# Patient Record
Sex: Female | Born: 1935
Health system: Southern US, Community
[De-identification: ages and names within clinical notes are randomized; demographics above are authoritative.]

## PROBLEM LIST (undated history)

## (undated) DIAGNOSIS — F419 Anxiety disorder, unspecified: Secondary | ICD-10-CM

## (undated) DIAGNOSIS — T4145XA Adverse effect of unspecified anesthetic, initial encounter: Secondary | ICD-10-CM

## (undated) DIAGNOSIS — B029 Zoster without complications: Secondary | ICD-10-CM

## (undated) DIAGNOSIS — K449 Diaphragmatic hernia without obstruction or gangrene: Secondary | ICD-10-CM

## (undated) DIAGNOSIS — R42 Dizziness and giddiness: Secondary | ICD-10-CM

## (undated) DIAGNOSIS — J449 Chronic obstructive pulmonary disease, unspecified: Secondary | ICD-10-CM

## (undated) DIAGNOSIS — G9341 Metabolic encephalopathy: Secondary | ICD-10-CM

## (undated) DIAGNOSIS — I69828 Other speech and language deficits following other cerebrovascular disease: Secondary | ICD-10-CM

## (undated) DIAGNOSIS — R0602 Shortness of breath: Secondary | ICD-10-CM

## (undated) DIAGNOSIS — K7689 Other specified diseases of liver: Secondary | ICD-10-CM

## (undated) DIAGNOSIS — K5790 Diverticulosis of intestine, part unspecified, without perforation or abscess without bleeding: Secondary | ICD-10-CM

## (undated) DIAGNOSIS — Z87440 Personal history of urinary (tract) infections: Secondary | ICD-10-CM

## (undated) DIAGNOSIS — K7581 Nonalcoholic steatohepatitis (NASH): Secondary | ICD-10-CM

## (undated) DIAGNOSIS — I10 Essential (primary) hypertension: Secondary | ICD-10-CM

## (undated) DIAGNOSIS — R0902 Hypoxemia: Secondary | ICD-10-CM

## (undated) DIAGNOSIS — J310 Chronic rhinitis: Secondary | ICD-10-CM

## (undated) DIAGNOSIS — E1142 Type 2 diabetes mellitus with diabetic polyneuropathy: Secondary | ICD-10-CM

## (undated) DIAGNOSIS — J9621 Acute and chronic respiratory failure with hypoxia: Secondary | ICD-10-CM

## (undated) DIAGNOSIS — M419 Scoliosis, unspecified: Secondary | ICD-10-CM

## (undated) DIAGNOSIS — K219 Gastro-esophageal reflux disease without esophagitis: Secondary | ICD-10-CM

## (undated) DIAGNOSIS — D696 Thrombocytopenia, unspecified: Secondary | ICD-10-CM

## (undated) DIAGNOSIS — H8110 Benign paroxysmal vertigo, unspecified ear: Principal | ICD-10-CM

## (undated) DIAGNOSIS — M48061 Spinal stenosis, lumbar region without neurogenic claudication: Secondary | ICD-10-CM

## (undated) DIAGNOSIS — R2689 Other abnormalities of gait and mobility: Secondary | ICD-10-CM

## (undated) DIAGNOSIS — J309 Allergic rhinitis, unspecified: Secondary | ICD-10-CM

## (undated) DIAGNOSIS — M5430 Sciatica, unspecified side: Secondary | ICD-10-CM

## (undated) DIAGNOSIS — R41841 Cognitive communication deficit: Secondary | ICD-10-CM

## (undated) DIAGNOSIS — K222 Esophageal obstruction: Secondary | ICD-10-CM

## (undated) DIAGNOSIS — M4180 Other forms of scoliosis, site unspecified: Secondary | ICD-10-CM

## (undated) DIAGNOSIS — E1149 Type 2 diabetes mellitus with other diabetic neurological complication: Secondary | ICD-10-CM

## (undated) DIAGNOSIS — R778 Other specified abnormalities of plasma proteins: Secondary | ICD-10-CM

## (undated) DIAGNOSIS — K5900 Constipation, unspecified: Secondary | ICD-10-CM

## (undated) DIAGNOSIS — D72829 Elevated white blood cell count, unspecified: Secondary | ICD-10-CM

## (undated) DIAGNOSIS — E669 Obesity, unspecified: Secondary | ICD-10-CM

## (undated) DIAGNOSIS — R2681 Unsteadiness on feet: Secondary | ICD-10-CM

## (undated) DIAGNOSIS — H409 Unspecified glaucoma: Secondary | ICD-10-CM

## (undated) DIAGNOSIS — E1140 Type 2 diabetes mellitus with diabetic neuropathy, unspecified: Secondary | ICD-10-CM

## (undated) DIAGNOSIS — M1990 Unspecified osteoarthritis, unspecified site: Secondary | ICD-10-CM

## (undated) DIAGNOSIS — E785 Hyperlipidemia, unspecified: Secondary | ICD-10-CM

## (undated) DIAGNOSIS — T8859XA Other complications of anesthesia, initial encounter: Secondary | ICD-10-CM

## (undated) DIAGNOSIS — R197 Diarrhea, unspecified: Secondary | ICD-10-CM

## (undated) DIAGNOSIS — I442 Atrioventricular block, complete: Secondary | ICD-10-CM

## (undated) DIAGNOSIS — K589 Irritable bowel syndrome without diarrhea: Secondary | ICD-10-CM

## (undated) DIAGNOSIS — M6281 Muscle weakness (generalized): Secondary | ICD-10-CM

## (undated) HISTORY — DX: Other specified diseases of liver: K76.89

## (undated) HISTORY — PX: TONSILLECTOMY AND ADENOIDECTOMY: SUR1326

## (undated) HISTORY — DX: Diverticulosis of intestine, part unspecified, without perforation or abscess without bleeding: K57.90

## (undated) HISTORY — DX: Dizziness and giddiness: R42

## (undated) HISTORY — DX: Gastro-esophageal reflux disease without esophagitis: K21.9

## (undated) HISTORY — DX: Allergic rhinitis, unspecified: J30.9

## (undated) HISTORY — PX: PARATHYROID EXPLORATION: SHX732

## (undated) HISTORY — DX: Chronic rhinitis: J31.0

## (undated) HISTORY — DX: Type 2 diabetes mellitus with diabetic neuropathy, unspecified: E11.40

## (undated) HISTORY — DX: Other abnormalities of gait and mobility: R26.89

## (undated) HISTORY — DX: Hyperlipidemia, unspecified: E78.5

## (undated) HISTORY — DX: Other specified abnormalities of plasma proteins: R77.8

## (undated) HISTORY — DX: Nonalcoholic steatohepatitis (NASH): K75.81

## (undated) HISTORY — DX: Metabolic encephalopathy: G93.41

## (undated) HISTORY — DX: Cognitive communication deficit: R41.841

## (undated) HISTORY — DX: Acute and chronic respiratory failure with hypoxia: J96.21

## (undated) HISTORY — DX: Esophageal obstruction: K22.2

## (undated) HISTORY — DX: Chronic obstructive pulmonary disease, unspecified: J44.9

## (undated) HISTORY — DX: Atrioventricular block, complete: I44.2

## (undated) HISTORY — DX: Other forms of scoliosis, site unspecified: M41.80

## (undated) HISTORY — DX: Essential (primary) hypertension: I10

## (undated) HISTORY — DX: Diaphragmatic hernia without obstruction or gangrene: K44.9

## (undated) HISTORY — DX: Zoster without complications: B02.9

## (undated) HISTORY — DX: Type 2 diabetes mellitus with diabetic polyneuropathy: E11.42

## (undated) HISTORY — DX: Thrombocytopenia, unspecified: D69.6

## (undated) HISTORY — PX: ESOPHAGOGASTRODUODENOSCOPY: SHX1529

## (undated) HISTORY — DX: Diarrhea, unspecified: R19.7

## (undated) HISTORY — DX: Spinal stenosis, lumbar region without neurogenic claudication: M48.061

## (undated) HISTORY — PX: INTRAOCULAR LENS INSERTION: SHX110

## (undated) HISTORY — DX: Type 2 diabetes mellitus with other diabetic neurological complication: E11.49

## (undated) HISTORY — PX: APPENDECTOMY: SHX54

## (undated) HISTORY — DX: Irritable bowel syndrome, unspecified: K58.9

## (undated) HISTORY — PX: TOTAL ABDOMINAL HYSTERECTOMY: SHX209

## (undated) HISTORY — PX: CARPAL TUNNEL RELEASE: SHX101

## (undated) HISTORY — DX: Muscle weakness (generalized): M62.81

## (undated) HISTORY — DX: Scoliosis, unspecified: M41.9

## (undated) HISTORY — DX: Anxiety disorder, unspecified: F41.9

## (undated) HISTORY — DX: Sciatica, unspecified side: M54.30

## (undated) HISTORY — DX: Obesity, unspecified: E66.9

## (undated) HISTORY — DX: Elevated white blood cell count, unspecified: D72.829

## (undated) HISTORY — DX: Constipation, unspecified: K59.00

## (undated) HISTORY — DX: Unsteadiness on feet: R26.81

## (undated) HISTORY — DX: Benign paroxysmal vertigo, unspecified ear: H81.10

## (undated) HISTORY — DX: Personal history of urinary (tract) infections: Z87.440

## (undated) HISTORY — DX: Other speech and language deficits following other cerebrovascular disease: I69.828

---

## 1976-05-08 HISTORY — PX: CHOLECYSTECTOMY OPEN: SUR202

## 1992-08-06 HISTORY — PX: LIVER BIOPSY: SHX301

## 1995-06-29 ENCOUNTER — Encounter: Payer: Self-pay | Admitting: Internal Medicine

## 1998-03-16 ENCOUNTER — Ambulatory Visit (HOSPITAL_COMMUNITY): Admission: RE | Admit: 1998-03-16 | Discharge: 1998-03-16 | Payer: Self-pay | Admitting: *Deleted

## 1999-03-04 ENCOUNTER — Ambulatory Visit (HOSPITAL_COMMUNITY): Admission: RE | Admit: 1999-03-04 | Discharge: 1999-03-04 | Payer: Self-pay | Admitting: Family Medicine

## 1999-03-04 ENCOUNTER — Encounter: Payer: Self-pay | Admitting: Family Medicine

## 1999-08-23 ENCOUNTER — Encounter (HOSPITAL_COMMUNITY): Admission: RE | Admit: 1999-08-23 | Discharge: 1999-11-21 | Payer: Self-pay | Admitting: Internal Medicine

## 2000-08-20 ENCOUNTER — Ambulatory Visit (HOSPITAL_BASED_OUTPATIENT_CLINIC_OR_DEPARTMENT_OTHER): Admission: RE | Admit: 2000-08-20 | Discharge: 2000-08-20 | Payer: Self-pay | Admitting: General Surgery

## 2000-08-20 ENCOUNTER — Encounter (INDEPENDENT_AMBULATORY_CARE_PROVIDER_SITE_OTHER): Payer: Self-pay | Admitting: Specialist

## 2001-03-21 ENCOUNTER — Encounter: Payer: Self-pay | Admitting: *Deleted

## 2001-03-21 ENCOUNTER — Ambulatory Visit (HOSPITAL_COMMUNITY): Admission: RE | Admit: 2001-03-21 | Discharge: 2001-03-21 | Payer: Self-pay | Admitting: *Deleted

## 2001-07-06 HISTORY — PX: COLONOSCOPY: SHX174

## 2001-08-01 ENCOUNTER — Encounter: Payer: Self-pay | Admitting: Internal Medicine

## 2001-12-25 ENCOUNTER — Encounter: Payer: Self-pay | Admitting: Family Medicine

## 2001-12-25 ENCOUNTER — Encounter: Admission: RE | Admit: 2001-12-25 | Discharge: 2001-12-25 | Payer: Self-pay | Admitting: Family Medicine

## 2002-01-08 ENCOUNTER — Encounter: Admission: RE | Admit: 2002-01-08 | Discharge: 2002-04-08 | Payer: Self-pay | Admitting: Family Medicine

## 2003-02-17 ENCOUNTER — Encounter: Payer: Self-pay | Admitting: Orthopedic Surgery

## 2003-02-17 ENCOUNTER — Encounter: Admission: RE | Admit: 2003-02-17 | Discharge: 2003-02-17 | Payer: Self-pay | Admitting: Orthopedic Surgery

## 2003-02-19 ENCOUNTER — Ambulatory Visit (HOSPITAL_COMMUNITY): Admission: RE | Admit: 2003-02-19 | Discharge: 2003-02-19 | Payer: Self-pay | Admitting: Orthopedic Surgery

## 2003-02-19 ENCOUNTER — Ambulatory Visit (HOSPITAL_BASED_OUTPATIENT_CLINIC_OR_DEPARTMENT_OTHER): Admission: RE | Admit: 2003-02-19 | Discharge: 2003-02-19 | Payer: Self-pay | Admitting: Orthopedic Surgery

## 2003-08-14 ENCOUNTER — Encounter: Admission: RE | Admit: 2003-08-14 | Discharge: 2003-09-22 | Payer: Self-pay | Admitting: Endocrinology

## 2004-05-05 ENCOUNTER — Ambulatory Visit: Payer: Self-pay | Admitting: Internal Medicine

## 2004-08-10 ENCOUNTER — Ambulatory Visit: Payer: Self-pay | Admitting: Internal Medicine

## 2005-01-02 ENCOUNTER — Ambulatory Visit: Payer: Self-pay | Admitting: Internal Medicine

## 2005-01-23 ENCOUNTER — Ambulatory Visit: Payer: Self-pay | Admitting: Internal Medicine

## 2005-03-21 ENCOUNTER — Ambulatory Visit: Payer: Self-pay | Admitting: Internal Medicine

## 2005-07-19 ENCOUNTER — Ambulatory Visit: Payer: Self-pay | Admitting: Internal Medicine

## 2005-07-24 ENCOUNTER — Ambulatory Visit: Payer: Self-pay | Admitting: Internal Medicine

## 2005-09-12 ENCOUNTER — Encounter: Admission: RE | Admit: 2005-09-12 | Discharge: 2005-09-12 | Payer: Self-pay | Admitting: Endocrinology

## 2005-11-22 ENCOUNTER — Ambulatory Visit: Payer: Self-pay | Admitting: Internal Medicine

## 2005-11-29 ENCOUNTER — Encounter: Admission: RE | Admit: 2005-11-29 | Discharge: 2005-12-05 | Payer: Self-pay | Admitting: Endocrinology

## 2006-01-30 ENCOUNTER — Ambulatory Visit: Payer: Self-pay | Admitting: Internal Medicine

## 2006-05-23 ENCOUNTER — Ambulatory Visit: Payer: Self-pay | Admitting: Internal Medicine

## 2006-07-11 ENCOUNTER — Ambulatory Visit (HOSPITAL_COMMUNITY): Admission: RE | Admit: 2006-07-11 | Discharge: 2006-07-11 | Payer: Self-pay | Admitting: Ophthalmology

## 2006-09-19 ENCOUNTER — Ambulatory Visit: Payer: Self-pay | Admitting: Internal Medicine

## 2007-01-17 ENCOUNTER — Ambulatory Visit: Payer: Self-pay | Admitting: Internal Medicine

## 2007-04-08 ENCOUNTER — Ambulatory Visit: Payer: Self-pay | Admitting: Internal Medicine

## 2007-04-15 ENCOUNTER — Ambulatory Visit (HOSPITAL_COMMUNITY): Admission: RE | Admit: 2007-04-15 | Discharge: 2007-04-15 | Payer: Self-pay | Admitting: Internal Medicine

## 2007-04-26 ENCOUNTER — Ambulatory Visit: Payer: Self-pay | Admitting: Internal Medicine

## 2007-05-27 ENCOUNTER — Encounter: Payer: Self-pay | Admitting: Internal Medicine

## 2007-05-29 DIAGNOSIS — K589 Irritable bowel syndrome without diarrhea: Secondary | ICD-10-CM | POA: Insufficient documentation

## 2007-05-29 DIAGNOSIS — K7689 Other specified diseases of liver: Secondary | ICD-10-CM | POA: Insufficient documentation

## 2007-05-29 DIAGNOSIS — K219 Gastro-esophageal reflux disease without esophagitis: Secondary | ICD-10-CM | POA: Insufficient documentation

## 2007-05-30 ENCOUNTER — Telehealth (INDEPENDENT_AMBULATORY_CARE_PROVIDER_SITE_OTHER): Payer: Self-pay | Admitting: *Deleted

## 2007-05-31 ENCOUNTER — Telehealth: Payer: Self-pay | Admitting: Internal Medicine

## 2007-06-29 ENCOUNTER — Encounter: Payer: Self-pay | Admitting: Internal Medicine

## 2007-07-16 ENCOUNTER — Telehealth (INDEPENDENT_AMBULATORY_CARE_PROVIDER_SITE_OTHER): Payer: Self-pay | Admitting: *Deleted

## 2007-07-16 ENCOUNTER — Ambulatory Visit: Payer: Self-pay | Admitting: Internal Medicine

## 2007-07-16 DIAGNOSIS — J310 Chronic rhinitis: Secondary | ICD-10-CM | POA: Insufficient documentation

## 2007-07-19 ENCOUNTER — Telehealth (INDEPENDENT_AMBULATORY_CARE_PROVIDER_SITE_OTHER): Payer: Self-pay | Admitting: *Deleted

## 2007-08-01 ENCOUNTER — Telehealth (INDEPENDENT_AMBULATORY_CARE_PROVIDER_SITE_OTHER): Payer: Self-pay | Admitting: *Deleted

## 2007-08-06 ENCOUNTER — Telehealth (INDEPENDENT_AMBULATORY_CARE_PROVIDER_SITE_OTHER): Payer: Self-pay | Admitting: *Deleted

## 2007-08-29 ENCOUNTER — Encounter: Payer: Self-pay | Admitting: Internal Medicine

## 2007-09-08 ENCOUNTER — Encounter: Payer: Self-pay | Admitting: Internal Medicine

## 2007-09-20 ENCOUNTER — Telehealth (INDEPENDENT_AMBULATORY_CARE_PROVIDER_SITE_OTHER): Payer: Self-pay | Admitting: *Deleted

## 2007-10-18 ENCOUNTER — Telehealth: Payer: Self-pay | Admitting: Internal Medicine

## 2007-10-29 ENCOUNTER — Telehealth (INDEPENDENT_AMBULATORY_CARE_PROVIDER_SITE_OTHER): Payer: Self-pay | Admitting: *Deleted

## 2007-11-01 ENCOUNTER — Telehealth (INDEPENDENT_AMBULATORY_CARE_PROVIDER_SITE_OTHER): Payer: Self-pay | Admitting: *Deleted

## 2007-11-04 ENCOUNTER — Telehealth (INDEPENDENT_AMBULATORY_CARE_PROVIDER_SITE_OTHER): Payer: Self-pay | Admitting: *Deleted

## 2007-11-06 ENCOUNTER — Telehealth (INDEPENDENT_AMBULATORY_CARE_PROVIDER_SITE_OTHER): Payer: Self-pay | Admitting: *Deleted

## 2007-11-15 ENCOUNTER — Ambulatory Visit: Payer: Self-pay | Admitting: Internal Medicine

## 2007-11-23 DIAGNOSIS — J302 Other seasonal allergic rhinitis: Secondary | ICD-10-CM | POA: Insufficient documentation

## 2007-11-23 DIAGNOSIS — J3089 Other allergic rhinitis: Secondary | ICD-10-CM

## 2007-11-23 DIAGNOSIS — J449 Chronic obstructive pulmonary disease, unspecified: Secondary | ICD-10-CM | POA: Insufficient documentation

## 2008-01-06 ENCOUNTER — Telehealth (INDEPENDENT_AMBULATORY_CARE_PROVIDER_SITE_OTHER): Payer: Self-pay | Admitting: *Deleted

## 2008-05-08 DIAGNOSIS — B029 Zoster without complications: Secondary | ICD-10-CM

## 2008-05-08 HISTORY — DX: Zoster without complications: B02.9

## 2008-06-16 ENCOUNTER — Ambulatory Visit: Payer: Self-pay | Admitting: Internal Medicine

## 2008-08-10 ENCOUNTER — Encounter: Payer: Self-pay | Admitting: Internal Medicine

## 2008-08-14 ENCOUNTER — Encounter: Payer: Self-pay | Admitting: Internal Medicine

## 2008-08-27 ENCOUNTER — Encounter: Payer: Self-pay | Admitting: Internal Medicine

## 2008-11-27 ENCOUNTER — Telehealth (INDEPENDENT_AMBULATORY_CARE_PROVIDER_SITE_OTHER): Payer: Self-pay | Admitting: *Deleted

## 2008-12-14 ENCOUNTER — Ambulatory Visit: Payer: Self-pay | Admitting: Internal Medicine

## 2009-01-15 ENCOUNTER — Ambulatory Visit: Payer: Self-pay | Admitting: Internal Medicine

## 2009-01-15 DIAGNOSIS — J449 Chronic obstructive pulmonary disease, unspecified: Secondary | ICD-10-CM | POA: Insufficient documentation

## 2009-01-19 ENCOUNTER — Telehealth: Payer: Self-pay | Admitting: Internal Medicine

## 2009-01-25 ENCOUNTER — Telehealth (INDEPENDENT_AMBULATORY_CARE_PROVIDER_SITE_OTHER): Payer: Self-pay | Admitting: *Deleted

## 2009-01-25 ENCOUNTER — Telehealth: Payer: Self-pay | Admitting: Internal Medicine

## 2009-01-28 ENCOUNTER — Ambulatory Visit: Payer: Self-pay | Admitting: Internal Medicine

## 2009-01-28 DIAGNOSIS — R29818 Other symptoms and signs involving the nervous system: Secondary | ICD-10-CM | POA: Insufficient documentation

## 2009-02-23 ENCOUNTER — Telehealth (INDEPENDENT_AMBULATORY_CARE_PROVIDER_SITE_OTHER): Payer: Self-pay | Admitting: *Deleted

## 2009-04-27 ENCOUNTER — Telehealth (INDEPENDENT_AMBULATORY_CARE_PROVIDER_SITE_OTHER): Payer: Self-pay | Admitting: *Deleted

## 2009-05-21 ENCOUNTER — Ambulatory Visit: Payer: Self-pay | Admitting: Internal Medicine

## 2009-05-25 ENCOUNTER — Telehealth: Payer: Self-pay | Admitting: Internal Medicine

## 2009-06-14 ENCOUNTER — Telehealth (INDEPENDENT_AMBULATORY_CARE_PROVIDER_SITE_OTHER): Payer: Self-pay | Admitting: *Deleted

## 2009-08-20 ENCOUNTER — Ambulatory Visit: Payer: Self-pay | Admitting: Internal Medicine

## 2009-10-11 ENCOUNTER — Encounter: Payer: Self-pay | Admitting: Internal Medicine

## 2009-11-04 ENCOUNTER — Encounter: Admission: RE | Admit: 2009-11-04 | Discharge: 2009-11-04 | Payer: Self-pay | Admitting: Endocrinology

## 2009-12-09 ENCOUNTER — Telehealth (INDEPENDENT_AMBULATORY_CARE_PROVIDER_SITE_OTHER): Payer: Self-pay | Admitting: *Deleted

## 2009-12-10 ENCOUNTER — Ambulatory Visit: Payer: Self-pay | Admitting: Internal Medicine

## 2009-12-15 ENCOUNTER — Telehealth (INDEPENDENT_AMBULATORY_CARE_PROVIDER_SITE_OTHER): Payer: Self-pay | Admitting: *Deleted

## 2009-12-22 ENCOUNTER — Telehealth (INDEPENDENT_AMBULATORY_CARE_PROVIDER_SITE_OTHER): Payer: Self-pay | Admitting: *Deleted

## 2009-12-23 ENCOUNTER — Ambulatory Visit: Payer: Self-pay | Admitting: Internal Medicine

## 2009-12-23 DIAGNOSIS — R059 Cough, unspecified: Secondary | ICD-10-CM | POA: Insufficient documentation

## 2009-12-23 DIAGNOSIS — R0789 Other chest pain: Secondary | ICD-10-CM | POA: Insufficient documentation

## 2009-12-23 DIAGNOSIS — R05 Cough: Secondary | ICD-10-CM

## 2009-12-23 DIAGNOSIS — R079 Chest pain, unspecified: Secondary | ICD-10-CM | POA: Insufficient documentation

## 2009-12-24 ENCOUNTER — Telehealth (INDEPENDENT_AMBULATORY_CARE_PROVIDER_SITE_OTHER): Payer: Self-pay | Admitting: *Deleted

## 2009-12-27 ENCOUNTER — Ambulatory Visit: Payer: Self-pay | Admitting: Internal Medicine

## 2009-12-30 ENCOUNTER — Ambulatory Visit: Payer: Self-pay | Admitting: Internal Medicine

## 2010-01-05 ENCOUNTER — Telehealth (INDEPENDENT_AMBULATORY_CARE_PROVIDER_SITE_OTHER): Payer: Self-pay | Admitting: *Deleted

## 2010-01-19 ENCOUNTER — Telehealth (INDEPENDENT_AMBULATORY_CARE_PROVIDER_SITE_OTHER): Payer: Self-pay | Admitting: *Deleted

## 2010-02-28 ENCOUNTER — Telehealth (INDEPENDENT_AMBULATORY_CARE_PROVIDER_SITE_OTHER): Payer: Self-pay | Admitting: *Deleted

## 2010-03-09 ENCOUNTER — Encounter: Payer: Self-pay | Admitting: Nurse Practitioner

## 2010-03-11 ENCOUNTER — Telehealth: Payer: Self-pay | Admitting: Internal Medicine

## 2010-03-16 ENCOUNTER — Telehealth (INDEPENDENT_AMBULATORY_CARE_PROVIDER_SITE_OTHER): Payer: Self-pay | Admitting: *Deleted

## 2010-03-16 ENCOUNTER — Ambulatory Visit: Payer: Self-pay | Admitting: Gastroenterology

## 2010-03-16 DIAGNOSIS — H409 Unspecified glaucoma: Secondary | ICD-10-CM | POA: Insufficient documentation

## 2010-03-16 DIAGNOSIS — R490 Dysphonia: Secondary | ICD-10-CM | POA: Insufficient documentation

## 2010-04-01 ENCOUNTER — Telehealth (INDEPENDENT_AMBULATORY_CARE_PROVIDER_SITE_OTHER): Payer: Self-pay | Admitting: *Deleted

## 2010-04-05 ENCOUNTER — Telehealth: Payer: Self-pay | Admitting: Nurse Practitioner

## 2010-04-29 ENCOUNTER — Ambulatory Visit: Payer: Self-pay | Admitting: Internal Medicine

## 2010-06-09 NOTE — Progress Notes (Signed)
Summary: ? re;meds & test  Phone Note Call from Patient Call back at Home Phone [phone]   Caller: Patient Call For: wert Reason for Call: Talk to Nurse Summary of Call: pt has question about prednisone prescribed on 12/23/2009 and no one called her re: her sinus CT. Initial call taken by: Zigmund Gottron,  December 24, 2009 8:08 AM  Follow-up for Phone Call        Pt should let her DM dr know she is on prednisone and they can adjust meds and monitor Blood sugars accordingly.Clayborne Dana CMA  December 24, 2009 9:03 AM   Additional Follow-up for Phone Call Additional follow up Details #1::        Per Dr. Melvyn Novas pt can spread the prednisone out during the day and don't have to take all at one time. Pt is aware that [facility] will be contacting her today about her sinus CT [facility]  December 24, 2009 9:12 AM

## 2010-06-09 NOTE — Progress Notes (Signed)
Summary: GI issues with nurse   cPhone Note Call from Patient Call back at Home Phone [phone]   Call For: Dr [name] Reason for Call: Talk to Nurse Summary of Call: Is having some issues she would like to discuss with nurse. Initial call taken by: Irwin Brakeman [facility],  March 11, 2010 10:06 AM  Follow-up for Phone Call        Patient called to report she has had a cough since August. She went to see ENT and he told her "my voice box  is eroded from acid reflux." ENT suggested she call and see if she should change her reflux medication. She is currently taking Prilosec two times a day per Dr. Melvyn Novas without relief. Also states she hurts in the center of her chest and has pressure like she has had in the past with reflux and her stomach feels bloated. States she has had this pain for a long time not new. Last EGD 12/8- hiatal hernia and esophagel stricture. Patient has a hx of COPD, diabetes and chronic diarrhea.  Patient scheduled for appointment with Tye Savoy, RNP on 03/16/10 at 9:30. Patient will go to the ER if her chest pain gets worse, changes  or radiates down her arm. Any other suggestions?  Follow-up by: Leone Payor RN,  March 11, 2010 12:21 PM  Additional Follow-up for Phone Call Additional follow up Details #1::        PPIs are best taken 20 -30 min before meals (BF and dinner), would make she is on that schedule. Can also add a bedtime H2 blocker (such as zantac/pepic OTC). two times a day PPI and at bedtime H2 blocker really is maximum medical therapy. Additional Follow-up by: Milus Banister MD,  March 11, 2010 12:40 PM    Additional Follow-up for Phone Call Additional follow up Details #2::    Patient verified that she does take PPI 30 minutes prior to breakfast and supper. Patient given recommendations per Dr. Ardis Hughs. She will add Zantac or Pepcid OTC  q hs. Patient will keep appointment. Follow-up by: Leone Payor RN,  March 11, 2010 1:37 PM

## 2010-06-09 NOTE — Progress Notes (Signed)
Summary: pt condition  Phone Note Call from Patient Call back at Home Phone [phone]   Caller: Patient Call For: young Reason for Call: Talk to Nurse Summary of Call: calling in to speak to Katie re: how pt is doing.  She  is alright, but not well.  Would like to discuss with Katie. Initial call taken by: Zigmund Gottron,  December 15, 2009 10:44 AM  Follow-up for Phone Call        Pt c/o SOB, weakness, productive cough with light green mucus, night sweats. Pt denies sore throat. Pt states will finish all medications today and only feels a little better. Pt requesting more medication, stating until her mucus is clear she will not be well. Please advise. Thanks. Iran Planas CMA  December 15, 2009 10:54 AM   Allergies (verified):  1)  ! Codeine 2)  ! * Ultram/ Tramadol  Additional Follow-up for Phone Call Additional follow up Details #1::        Offer augmentin 500 mg, # 14, 1 bid Additional Follow-up by: Deneise Lever MD,  December 15, 2009 12:33 PM    Additional Follow-up for Phone Call Additional follow up Details #2::    Pt aware and knows RX sent.Clayborne Dana CMA  December 15, 2009 2:31 PM   New/Updated Medications: AUGMENTIN 500-125 MG TABS (AMOXICILLIN-POT CLAVULANATE) take 1 by mouth two times a day Prescriptions: AUGMENTIN 500-125 MG TABS (AMOXICILLIN-POT CLAVULANATE) take 1 by mouth two times a day  #14 x 0   Entered by:   Clayborne Dana CMA   Authorized by:   Deneise Lever MD   Signed by:   Clayborne Dana CMA on 12/15/2009   Method used:   Electronically to        [facility]* (retail)       [address] [place], [place]  [address]       Ph: [phone]       Fax: [phone]   RxID:   [id number]

## 2010-06-09 NOTE — Progress Notes (Signed)
Summary: bronchitis  Phone Note Call from Patient   Caller: Patient Call For: young Summary of Call: bronchitis  [employer] Initial call taken by: Gustavus Bryant,  June 14, 2009 10:29 AM  Follow-up for Phone Call        Pt c/o productive cough with clear mucus, head and chest congestion, nasal drainage, x 1 week and productive cough with green mucus starting this AM. Pt request CY to send in zpak and prednisone 33m tabs to pharmacy. Please advise. JIran PlanasCMA  June 14, 2009 12:02 PM   Medication allergies: 1)  ! Codeine 2)  ! * Ultram/ Tramadol   Additional Follow-up for Phone Call Additional follow up Details #1::        Per CDY- give Zpak #1 take as directed no refills and Prednsione 580m#40 2 qid x 2 days, 2 three times a day x 2 days, 2 two times a day x 2 days, 2 once daily x 2 days; no refills.KaClayborne DanaMA  June 14, 2009 1:51 PM   pt aware meds at the pharmacy Additional Follow-up by: TaMaryann ConnersMA,  June 14, 2009 2:26 PM    New/Updated Medications: ZITHROMAX Z-PAK 250 MG TABS (AZITHROMYCIN) take as directed PREDNISONE 5 MG TABS (PREDNISONE) 2 tabs four times a day x 2 days, 2 tabs three times a day x 2 days,2 tabs twice a day x 2 days and 2 tabs once daily x 2 days Prescriptions: PREDNISONE 5 MG TABS (PREDNISONE) 2 tabs four times a day x 2 days, 2 tabs three times a day x 2 days,2 tabs twice a day x 2 days and 2 tabs once daily x 2 days  #40 x 0   Entered by:   TaMaryann ConnersMA   Authorized by:   ClDeneise LeverD   Signed by:   TaMaryann ConnersMA on 06/14/2009   Method used:   [facility]ically to        [facility]       [address]  [address]     Ph: [phone]     Fax: [phone] RxID:   [id number] Z-PAK 250 MG TABS (AZITHROMYCIN) take as directed  #1 x 0   Entered by:   TaMaryann ConnersMA   Authorized by:    ClDeneise LeverD   Signed by:   TaMaryann ConnersMA on 06/14/2009   Method used:   [facility]        [facility]       [address]  [address]     Ph: [phone]     Fax: [phone] RxID:   [id number]

## 2010-06-09 NOTE — Progress Notes (Signed)
Summary: ? brochitis  Phone Note Call from Patient Call back at Home Phone [phone]   Caller: Patient Call For: young Reason for Call: Talk to Nurse Summary of Call: Pt states she's has bronchitis symptoms, coughing and can not stop.  Patient asking for a zpack.  [employer] Initial call taken by: Mateo Flow,  February 28, 2010 10:55 AM  Follow-up for Phone Call        called spoke with prod cough with green mucus, increased SOB, wheezing x4-5days.  requests zpak.  please advise, thanks!  ALLERGIES: codeine, tramadol.     Follow-up by: Parke Poisson CNA/MA,  February 28, 2010 12:23 PM  Additional Follow-up for Phone Call Additional follow up Details #1::        per cdy zpak #1 take as directed 0 refills [facility]  February 28, 2010 1:54 PM     Additional Follow-up for Phone Call Additional follow up Details #2::    Rx for zpack was sent to pharm.  Spoke with pt and notified this was done. Follow-up by: Tilden Dome,  February 28, 2010 2:02 PM  New/Updated Medications: ZITHROMAX Z-PAK 250 MG TABS (AZITHROMYCIN) take as directed Prescriptions: ZITHROMAX Z-PAK 250 MG TABS (AZITHROMYCIN) take as directed  #1 x 0   Entered by:   Tilden Dome   Authorized by:   Deneise Lever MD   Signed by:   Tilden Dome on 02/28/2010   Method used:   Electronically to        [facility]* (retail)       [address] [place], [place]  [address]       Ph: [phone]       Fax: [phone]   RxID:   [id number]

## 2010-06-09 NOTE — Assessment & Plan Note (Signed)
Summary: cough/rhinitis//lmr   Primary Provider/Referring Provider:  [name]  CC:  Accute visit-cough; sinus trouble-runny nose+green; "sick x 1 month".  History of Present Illness: December 10, 2009- Bronchitis, allergic rhinitis Hasn't felt well for past week. Starting 2 weeks ago she began having pain in upper back. [name] did CXR -negatve .He gave prednisone. She hadn't been coughing. Tired, sluggish, no fever. 6 days ago after working in home to pack for a move, she began coughing. Took cough syrup, then a zpak from primary office. Cough has become productive green. For 2 days wearing O2 continuously. rx doxy  December 23, 2009 Dr. Annamaria Boots pt. Pt c/o increased productive  cough with dark green to black mucus, nasal drainage during the day and dries out at night, lethargic, nausea, upper back pain between shoulders "raw" throat x 2 weeks ago.Pt states now has "dry hacky" cough. Pt has been treated with zpak, prednisone, Augmentin, breathing treatment > mucus white now, cough no better, still nasal congestion  and cp L shoulder positional not pleuritic.  Pt denies any significant sore throat, dysphagia, itching, sneezing,  fever, chills, sweats  December 30, 2009- Bronchitis, allergic rhinitis Saw Dr Melvyn Novas in my absence.  EKG was ok. He went over GERD management and she is now taking prilosec two times a day. CXR showed mild COPD. She reports she partly improved green sputum with Zpak from [name] now resolved. CT sinuses- Clear/ normal;. She talks about watery rhinorhea especially after waking in the AM and after supper. Has glaucoma in left eye.   Preventive Screening-Counseling & Management  Alcohol-Tobacco     Smoking Status: quit     Year Started: 1959     Year Quit: 1990     Pack years: 2- 3 ppd x 45 years     Passive Smoke Exposure: yes  Current Medications (verified): 1)  Atrovent Hfa 17 Mcg/act  Aers (Ipratropium Bromide Hfa) .... Inhale 2 Puffs Four Times A Day As Needed 2)   Oxygen 3-3.5l 3)  Fluticasone Propionate 50 Mcg/act  Susp (Fluticasone Propionate) .Marland Kitchen.. 1-2 Puffs Each Nostril Daily 4)  Toprol Xl 25 Mg  Tb24 (Metoprolol Succinate) .... Take 1 Tablet By Mouth Once A Day 5)  Atacand 16 Mg Tabs (Candesartan Cilexetil) .... Take 1 Tablet By Mouth Once A Day 6)  Prilosec 20 Mg  Cpdr (Omeprazole) .... Take One 30-60 Min Before First and Last Meals of The Day 7)  Aspirin 325 Mg Tabs (Aspirin) .... Take 1 Tablet By Mouth Once A Day 8)  Lantus 100 Unit/ml  Soln (Insulin Glargine) .... Use As Directed. 9)  Humalog Kwikpen 100 Unit/ml  Soln (Insulin Lispro (Human)) .... Use As Directed 10)  Alprazolam 0.5 Mg Tabs (Alprazolam) .... Take 1 By Mouth Three Times A Day As Needed 11)  Actos 30 Mg Tabs (Pioglitazone Hcl) .... Take 1 By Mouth Once Daily 12)  Tylenol Extra Strength 500 Mg Tabs (Acetaminophen) .... Take As Needed Headache. 13)  Promethazine-Codeine 6.25-10 Mg/55m Syrp (Promethazine-Codeine) .... Take 1 Teaspoonful By Mouth Every 6 Hours As Needed Cough 14)  Prednisone 10 Mg  Tabs (Prednisone) .... 4 Each Am X 2days, 2x2days, 1x2days and Stop  Allergies (verified): 1)  ! Codeine 2)  ! * Ultram/ Tramadol  Past History:  Past Medical History: Last updated: 12/23/2009 I B S-DIARRHEAL PREDOMINATE (ICD-564.1) FATTY LIVER DISEASE (ICD-571.8) G E R D (ICD-530.81) Allergic Rhinitis     - Sinus CT Rec December 23, 2009  COPD     -  HFA 75-90% p coaching December 23, 2009  Sciatica Shingles- 2010  Past Surgical History: Last updated: 05/29/2007 open cholecystectomy 1978 liver biopsy 4/94 parathyroid surgery T&A TAH EGD 7654,65/03, H Hernia, es.stricture s/p dil 73F colonoscopy 07/2001, mild diverticulosis  Family History: Last updated: 12/30/09 Mother -died smallcell  lung cancer, had Byssinosis Father- died heart disease, age 50 Sister- had lung cancer- died  Social History: Last updated: 12/23/2009 Patient states former smoker. in  1990s Divorced  Risk Factors: Smoking Status: quit (12/30/2009) Passive Smoke Exposure: yes (12/30/2009)  Review of Systems      See HPI       The patient complains of shortness of breath with activity, productive cough, non-productive cough, and sneezing.  The patient denies coughing up blood, chest pain, irregular heartbeats, acid heartburn, indigestion, loss of appetite, weight change, abdominal pain, difficulty swallowing, sore throat, tooth/dental problems, headaches, and nasal congestion/difficulty breathing through nose.    Vital Signs:  Patient profile:   75 year old female Height:      63 inches Weight:      202 pounds BMI:     35.91 O2 Sat:      90 % on Room air Pulse rate:   82 / minute BP sitting:   102 / 58  (left arm) Cuff size:   regular  Vitals Entered By: Clayborne Dana CMA (December 30, 2009 11:28 AM)  O2 Flow:  Room air CC: Accute visit-cough; sinus trouble-runny nose+green; "sick x 1 month"   Physical Exam  Additional Exam:  General: A/Ox3; pleasant and cooperative, NAD, SKIN: no rash, lesions NODES: no lymphadenopathy HEENT: [place]/AT, EOM- WNL, Conjuctivae- clear, PERRLA, TM-WNL, Nose- clear, Throat- clear and wnl, Mallampati  III NECK: Supple w/ fair ROM, JVD- none, normal carotid impulses w/o bruits Thyroid- normal to palpation CHEST: Clear to P&A, not labored and no cough or wheeze HEART: RRR, no m/g/r heard ABDOMEN: Soft and nl; nml bowel sounds; no organomegaly or masses noted TWS:FKCL, nl pulses, no edema  NEURO: Grossly intact to observation, very talkative      CXR  Procedure date:  12/23/2009  Findings:      DG CHEST 2 VIEW - [id number]   Clinical Data: Shortness of breath and chest pain.  Cough.  History of COPD.   CHEST - 2 VIEW   Comparison: 11/04/2009 and 05/21/2009.   Findings: The heart size and mediastinal contours are stable. There is mild aortic atherosclerosis.  The lungs remain mildly hyperinflated but clear.  There is no  pleural effusion or pneumothorax.  Mild thickening of the minor fissure is noted.   IMPRESSION: Stable examination with evidence of mild chronic obstructive pulmonary disease.  No acute findings identified.   Read By:  Vivia Ewing,  M.D.     Released By:  Vivia Ewing,  M.D.   CT of Sinus  Procedure date:  12/27/2009  Findings:      CT SINUS LTD W/O CM - [id number]   Clinical Data: Sinus drainage.  Cough.   CT PARANASAL SINUS LIMITED WITHOUT CONTRAST   Technique:  Multidetector CT images of the paranasal sinuses were obtained in a single plane without contrast.   Comparison: None   Findings: The paranasal sinuses and mastoid air cells are clear. There is slight deviation of the bony nasal septum rightward and mild spurring change.  The ostiomeatal complexes are grossly patent.  No significant thickening of the turbinates.   IMPRESSION: Clear paranasal sinuses.   Read By:  Marijo Sanes.  Elta Guadeloupe,  M.D.     Released By:  Liliana Cline,  M.D.   Impression & Recommendations:  Problem # 1:  CHRONIC RHINITIS (ICD-472.0)  Rhintis with bothersome rhinorhea. Both allergic and vasomotor comnponents. I will give her sample Astepro antihistamine spray to try   Problem # 2:  COPD (ICD-496) She finshed prednisone taper. I don't see an indication for another antibiotic now. She never feels quite clear in her chest, but I explained again her COPD status. Note that glaucoma may tolerate atrovent as a rescue med for occasional use, but not sustained therapy with spiriva. She doesnt know what kind of glaucoma she has. She tolerates stimulant beta adrenergics poorly.  Problem # 3:  G E R D (ICD-530.81) GERD control was reinforced as a potential aggravating factor for airway irritation. Her updated medication list for this problem includes:    Prilosec 20 Mg Cpdr (Omeprazole) .Marland Kitchen... Take one 30-60 min before first and last meals of the day  Medications Added to Medication  List This Visit: 1)  Actos 30 Mg Tabs (Pioglitazone hcl) .... Take 1 by mouth once daily  Other Orders: Est. Patient Level III (18841)  Patient Instructions: 1)  Please schedule a follow-up appointment in 4 months. 2)  Sample Astepro antihistamine nasal spray. 1-2 puffs each nostril up to twice daily when needed for watery nose.     CXR  Procedure date:  12/23/2009  Findings:      DG CHEST 2 VIEW - [id number]   Clinical Data: Shortness of breath and chest pain.  Cough.  History of COPD.   CHEST - 2 VIEW   Comparison: 11/04/2009 and 05/21/2009.   Findings: The heart size and mediastinal contours are stable. There is mild aortic atherosclerosis.  The lungs remain mildly hyperinflated but clear.  There is no pleural effusion or pneumothorax.  Mild thickening of the minor fissure is noted.   IMPRESSION: Stable examination with evidence of mild chronic obstructive pulmonary disease.  No acute findings identified.   Read By:  Vivia Ewing,  M.D.     Released By:  Vivia Ewing,  M.D.   CT of Sinus  Procedure date:  12/27/2009  Findings:      CT SINUS LTD W/O CM - [id number]   Clinical Data: Sinus drainage.  Cough.   CT PARANASAL SINUS LIMITED WITHOUT CONTRAST   Technique:  Multidetector CT images of the paranasal sinuses were obtained in a single plane without contrast.   Comparison: None   Findings: The paranasal sinuses and mastoid air cells are clear. There is slight deviation of the bony nasal septum rightward and mild spurring change.  The ostiomeatal complexes are grossly patent.  No significant thickening of the turbinates.   IMPRESSION: Clear paranasal sinuses.   Read By:  Liliana Cline,  M.D.     Released By:  Liliana Cline,  M.D.

## 2010-06-09 NOTE — Progress Notes (Signed)
Summary: update   Phone Note Call from Patient Call back at Home Phone [phone]   Caller: Patient Call For: Tye Savoy, NP Reason for Call: Talk to Nurse Summary of Call: 3 wk update: feeling better but out of Nexium... would like a rx for Nexium as she thinks this is better than Prilosec... still coughing  Initial call taken by: Lucien Mons,  April 05, 2010 10:53 AM  Follow-up for Phone Call        She came here for cough and hoarse voice so if still coughing, not sure how she is better but at any rate she can have the Nexium. She was previously on omeprazole twice daily  Follow-up by: Tye Savoy NP,  April 06, 2010 10:18 AM  Additional Follow-up for Phone Call Additional follow up Details #1::        [name] says the Nexium helps her refulx but it is giving her a headache and diarrhea.  She rarely gets headaches and was doing good with the diarrhea .  Now the diarrhea has started since she has taken the Nexium. Additional Follow-up by: Sharol Roussel,  April 08, 2010 2:09 PM    Additional Follow-up for Phone Call Additional follow up Details #2::    if she is having terrible diarrhea then should be seen.  Otherwise, don't understand why Nexium causing diarrhea if Prilosec didn't. She can go back on Prilosec for few days then rechallenge the Nexium to see if she gets recurrent diarrhea.  Follow-up by: Tye Savoy NP,  April 11, 2010 3:56 PM  Additional Follow-up for Phone Call Additional follow up Details #3:: Details for Additional Follow-up Action Taken: The pt said she had a closer to normal Bm this Am.  She is taking the Prilosec now and will let me know if the diarrhea reoccurs.  She is still hoarse and she said her ENT MD says it is reflux.  She doesn't like him.  I told her to call us next week with an update.  I offered to make an appt to see Dr. [name] but she declined.

## 2010-06-09 NOTE — Assessment & Plan Note (Signed)
Summary: rov 4 months///kp   Copy to:  n/a Primary Provider/Referring Provider:  Dr Ruffin Frederick   CC:  4 month follow up.  Still coughing up green mucus at times and c/o runny nose.  [name]  History of Present Illness: ugust 18, 2011 Dr. Annamaria Boots pt. Pt c/o increased productive  cough with dark green to black mucus, nasal drainage during the day and dries out at night, lethargic, nausea, upper back pain between shoulders "raw" throat x 2 weeks ago.Pt states now has "dry hacky" cough. Pt has been treated with zpak, prednisone, Augmentin, breathing treatment > mucus white now, cough no better, still nasal congestion  and cp L shoulder positional not pleuritic.  Pt denies any significant sore throat, dysphagia, itching, sneezing,  fever, chills, sweats  December 30, 2009- Bronchitis, allergic rhinitis Saw Dr Melvyn Novas in my absence.  EKG was ok. He went over GERD management and she is now taking prilosec two times a day. CXR showed mild COPD. She reports she partly improved green sputum with Zpak from [name] now resolved. CT sinuses- Clear/ normal;. She talks about watery rhinorhea especially after waking in the AM and after supper. Has glaucoma in left eye.  April 29, 2010-  Bronchitis, allergic rhinitis, GERD Nurse-CC: 4 month follow up.  Still coughing up green mucus at times and c/o runny nose.   We called Z pak for bronchitis in late November.  She had ENT eval with laryngoscopy - told GERD with irritated vocal cords. They also told her drainage was "maybe allergy". Allergy w/u in past neg. Astepro helps some, uses Nasonex daily.  C/o spasms ? describing esophageal spasm.  She needs to put her oxygen on for sleep and sometimes in day. Liquid O2 [place].     Preventive Screening-Counseling & Management  Alcohol-Tobacco     Smoking Status: quit     Year Started: 1959     Year Quit: 1990     Pack years: 2- 3 ppd x 45 years     Passive Smoke Exposure: yes  Caffeine-Diet-Exercise     Does Patient  Exercise: no  Current Medications (verified): 1)  Atrovent Hfa 17 Mcg/act  Aers (Ipratropium Bromide Hfa) .... Inhale 2 Puffs Four Times A Day As Needed 2)  Oxygen 3-3.5l 3)  Fluticasone Propionate 50 Mcg/act  Susp (Fluticasone Propionate) .[name].. 1-2 Puffs Each Nostril Daily 4)  Toprol Xl 25 Mg  Tb24 (Metoprolol Succinate) .... Take 1 Tablet By Mouth Once A Day 5)  Cozaar 50 Mg Tabs (Losartan Potassium) .... Take 1 Tablet By Mouth Once A Day 6)  Prilosec 20 Mg  Cpdr (Omeprazole) .... Take One 30-60 Min Before First and Last Meals of The Day 7)  Aspirin 325 Mg Tabs (Aspirin) .... Take 1 Tablet By Mouth Once A Day 8)  Lantus 100 Unit/ml  Soln (Insulin Glargine) .... Take 34 Units Every Morning and 31 Units At Night. 9)  Humalog Kwikpen 100 Unit/ml  Soln (Insulin Lispro (Human)) .... Sliding Scale (Three Times A Day) 10)  Alprazolam 0.5 Mg Tabs (Alprazolam) .... Take 1 By Mouth Three Times A Day As Needed 11)  Actos 30 Mg Tabs (Pioglitazone Hcl) .... Take 1 By Mouth Once Daily 12)  Tylenol Extra Strength 500 Mg Tabs (Acetaminophen) .... Take As Needed Headache. 13)  Promethazine-Codeine 6.25-10 Mg/44m Syrp (Promethazine-Codeine) .... Take 1 Teaspoonful By Mouth Every 6 Hours As Needed Cough 14)  Astepro 0.15 % Soln (Azelastine Hcl) ..[name]. 1-2 Sprays in Each Nostril Up To  Twice Daily As Needed For Watery Nose 15)  Lescol 20 Mg Caps (Fluvastatin Sodium) .... Take 1 Tablet By Mouth 5 Days Per Week 16)  Bentyl 10 Mg Caps (Dicyclomine Hcl) .... Take 1 Capsule By Mouth As Needed 17)  Keflex 250 Mg Caps (Cephalexin) .... Take 1 Capsule By Mouth At Bedtime 18)  Timolol Maleate 0.5 % Soln (Timolol Maleate) .... Instill 1 Drops in Left Eye Bid 19)  Vitamin D 1000 Unit Tabs (Cholecalciferol) .... Take 2 Tabs Once Daily 20)  Claritin 10 Mg Tabs (Loratadine) .... Take 1 Tablet By Mouth As Needed  Allergies (verified): 1)  ! Codeine 2)  ! * Ultram/ Tramadol  Past History:  Past Medical History: Last  updated: 12/23/2009 I B S-DIARRHEAL PREDOMINATE (ICD-564.1) FATTY LIVER DISEASE (ICD-571.8) G E R D (ICD-530.81) Allergic Rhinitis     - Sinus CT Rec December 23, 2009  COPD     - HFA 75-90% p coaching December 23, 2009  Sciatica Shingles- 2010  Past Surgical History: Last updated: March 20, 2010 open cholecystectomy 1978 liver biopsy 4/94 parathyroid surgery T&A TAH EGD 1991,12/08, H Hernia, es.stricture s/p dil 68F colonoscopy 07/2001, mild diverticulosis Carpal Tunnel Release-right hand Cataract Extraction-Bilateral  Family History: Last updated: Mar 20, 2010 Mother -died smallcell  lung cancer, had Byssinosis Father- died heart disease, age 86 Sister- had lung cancer- died Family History of Liver Cancer: Sister (? mets from another area of the body) Family History of Diabetes: Grandmother  Social History: Last updated: March 20, 2010 Patient states former smoker. in 1990s Divorced Alcohol Use - no Daily Caffeine Use-1 1/2 cup daily Illicit Drug Use - no Patient does not get regular exercise.   Risk Factors: Exercise: no (04/29/2010)  Risk Factors: Smoking Status: quit (04/29/2010) Passive Smoke Exposure: yes (04/29/2010)  Review of Systems      See HPI       The patient complains of shortness of breath with activity, non-productive cough, and nasal congestion/difficulty breathing through nose.  The patient denies shortness of breath at rest, coughing up blood, chest pain, irregular heartbeats, acid heartburn, indigestion, loss of appetite, weight change, abdominal pain, difficulty swallowing, sore throat, tooth/dental problems, headaches, and sneezing.    Vital Signs:  Patient profile:   75 year old female Height:      64 inches Weight:      206.50 pounds BMI:     35.57 O2 Sat:      91 % on Room air Pulse rate:   80 / minute BP sitting:   142 / 78  (right arm) Cuff size:   regular  Vitals Entered By: Raymondo Band RN (April 29, 2010 10:01 AM)  O2 Flow:  Room  air  Physical Exam  Additional Exam:  General: A/Ox3; pleasant and cooperative, NAD, SKIN: no rash, lesions NODES: no lymphadenopathy HEENT: [place]/AT, EOM- WNL, Conjuctivae- clear, PERRLA, TM-WNL, Nose- clear, Throat- clear and wnl, Mallampati  III NECK: Supple w/ fair ROM, JVD- none, normal carotid impulses w/o bruits Thyroid- normal to palpation CHEST: Clear to P&A, not labored and no cough or wheeze HEART: RRR, no m/g/r heard ABDOMEN: overweight XNA:TFTD, nl pulses, no edema  NEURO: Grossly intact to observation, very talkative      Impression & Recommendations:  Problem # 1:  COPD (ICD-496) She has her oxygen with her, but seems comfortable with room air sat 91%. She doesn't stop talking today.   Problem # 2:  ALLERGIC RHINITIS (ICD-477.9) No visible drainage now. Her updated medication list for this problem includes:  Fluticasone Propionate 50 Mcg/act Susp (Fluticasone propionate) .[name]... 1-2 puffs each nostril daily    Astepro 0.15 % Soln (Azelastine hcl) .[name]... 1-2 sprays in each nostril up to twice daily as needed for watery nose    Claritin 10 Mg Tabs (Loratadine) .[name]... Take 1 tablet by mouth as needed  Problem # 3:  G E R D (ICD-530.81)  Mild hoarsenes and known reflux. I encouraged her to get back with Dr [name] as directed.  The following medications were removed from the medication list:    Nexium 40 Mg Cpdr (Esomeprazole magnesium) .[name]... Take 1 capsule twice daily Her updated medication list for this problem includes:    Prilosec 20 Mg Cpdr (Omeprazole) .[name]... Take one 30-60 min before first and last meals of the day    Bentyl 10 Mg Caps (Dicyclomine hcl) .[name]... Take 1 capsule by mouth as needed  Medications Added to Medication List This Visit: 1)  Cozaar 50 Mg Tabs (Losartan potassium) .... Take 1 tablet by mouth once a day 2)  Timolol Maleate 0.5 % Soln (Timolol maleate) .... Instill 1 drops in left eye bid 3)  Vitamin D 1000 Unit Tabs (Cholecalciferol) .... Take  2 tabs once daily  Other Orders: Est. Patient Level III (54982)  Patient Instructions: 1)  Please schedule a follow-up appointment in 6 months. 2)  Please see Dr [name] as directed for your reflux- this will cause hoarseness and cough.   Immunization History:  Influenza Immunization History:    Influenza:  historical (02/05/2010)

## 2010-06-09 NOTE — Letter (Signed)
Summary: [facility]  [facility]   Imported By: Bubba Hales 03/23/2010 10:04:01  _____________________________________________________________________  External Attachment:    Type:   Image     Comment:   External Document

## 2010-06-09 NOTE — Progress Notes (Signed)
Summary: Bentyl 10 MG medication   Phone Note Outgoing Call   Call placed by: Sharol Roussel,  March 16, 2010 2:32 PM Call placed to: Patient Summary of Call: LM on pt's home phone number and LM for her that Dr. Katy Fitch said it was okay for her to take the Bentyl 10 MG medication.  I advised her to call us if she has any questions or problems with the medication. Initial call taken by: Sharol Roussel,  March 16, 2010 2:33 PM

## 2010-06-09 NOTE — Progress Notes (Signed)
Summary: bronchitis > ok for zpak  Phone Note Call from Patient   Caller: Patient Call For: young Summary of Call: cough. dr Titus Mould called in this  message that was given to him from answering service . didn't have the name of a pharmacy. Initial call taken by: Gustavus Bryant,  April 01, 2010 10:54 AM  Follow-up for Phone Call        prod cough with green mucus, increased SOB, wheezing x2days - denies f/c/s.  requests zpak.  allergies: codeine, tramadol.  [employer].  last ov 8.25.11, next w/ CDY 12.23.11.  Follow-up by: Parke Poisson CNA/MA,  April 01, 2010 11:05 AM  Additional Follow-up for Phone Call Additional follow up Details #1::        Per CDY- ok to give Zpak #1 take as directed no refills.Clayborne Dana CMA  April 01, 2010 11:40 AM  Additional Follow-up by: Clayborne Dana CMA,  April 01, 2010 11:40 AM    Additional Follow-up for Phone Call Additional follow up Details #2::    called spoke with patient, advised of CDY's recs as stated above.  pt verbalized her understanding.  rx sent to verified pharmacy. Parke Poisson CNA/MA  April 01, 2010 11:43 AM   New/Updated Medications: ZITHROMAX Z-PAK 250 MG TABS (AZITHROMYCIN) take as directed Prescriptions: ZITHROMAX Z-PAK 250 MG TABS (AZITHROMYCIN) take as directed  #1 x 0   Entered by:   Parke Poisson CNA/MA   Authorized by:   Deneise Lever MD   Signed by:   Parke Poisson CNA/MA on 04/01/2010   Method used:   Electronically to        [facility]* (retail)       [address] [place], [place]  [address]       Ph: [phone]       Fax: [phone]   RxID:   [id number]

## 2010-06-09 NOTE — Progress Notes (Signed)
Summary: bronchitis  Phone Note Call from Patient Call back at Home Phone [phone]   Caller: Patient Call For: parrett Summary of Call: pt still have bronchitis was told to call if no better. Initial call taken by: Gustavus Bryant,  December 22, 2009 8:24 AM  Follow-up for Phone Call        Pt states she is still having cough-nasal drainage-weak. Pt has been on approx 3 abx's and no better. Appt scheduled with MW on 12-23-09 at [place]  December 22, 2009 8:51 AM

## 2010-06-09 NOTE — Progress Notes (Signed)
Summary: results  Phone Note Call from Patient Call back at Home Phone [phone]   Caller: Patient Call For: [name] Summary of Call: pt wants results cxr.  Initial call taken by: Cooper Render,  May 25, 2009 4:00 PM  Follow-up for Phone Call        pt advised of cxr results per append. Dundee Bing CMA  May 25, 2009 4:15 PM

## 2010-06-09 NOTE — Assessment & Plan Note (Signed)
Summary: sob/lmr   Primary Provider/Referring Provider:  [name]  CC:  Accute visit-SOB and cough-productive-black/green in color; has had Zpak and Hard to breathe mostly at night..  History of Present Illness:  May 21, 2009- Bronchitis, allergic rhinitis Says some days "struggling" and uses oxygen some every day. Had flu shot. Gets some green from nose. She asks for CT with contrast, afraid of cancer- sister just dx'd metastatic cancer. Gets pains through anterior chest to back with no real pattern, not exertional. No heme or lumps. CXR in September chronic bronchitis.  August 20, 2009-  Bronchitis, allergic rhinitis She says she is about the same. Frequent cough- usually clear, rarely green. Recent Z pak from [name] did help last time it was green. Gets hoarse and raspy- comes and goes. Occasional cough when eating. Hx Dr [name] dilated a stricture. Some wheeze. Seeing a chiropracter for chronic back pain. Not outdoors much in the pollen. She has home oxygen, used intermittently. We discussed indication and goals of home O2.  2010-01-07- Bronchitis, allergic rhinitis Hasn't felt well for past week. Starting 2 weeks ago she began having pain in upper back. [name] did CXR -negatve .He gave prednisone. She hadn't been coughing. Tired, sluggish, no fever. 6 days ago after working in home to pack for a move, she began coughing. Took cough syrup, then a zpak from primary office. Cough has become productive green. For 2 days wearing O2 continuously.   Preventive Screening-Counseling & Management  Alcohol-Tobacco     Smoking Status: quit     Year Quit: 1990     Pack years: 2- 3 ppd x 45 years     Passive Smoke Exposure: yes  Current Medications (verified): 1)  Atrovent Hfa 17 Mcg/act  Aers (Ipratropium Bromide Hfa) .... Inhale 2 Puffs Four Times A Day As Needed 2)  Oxygen 3-3.5l 3)  Fluticasone Propionate 50 Mcg/act  Susp (Fluticasone Propionate) .Marland Kitchen.. 1-2 Puffs Each  Nostril Daily 4)  Toprol Xl 25 Mg  Tb24 (Metoprolol Succinate) 5)  Atacand 32 Mg  Tabs (Candesartan Cilexetil) 6)  Prilosec 20 Mg  Cpdr (Omeprazole) 7)  Aspirin 325 Mg Tabs (Aspirin) .... Take 1 Tablet By Mouth Once A Day 8)  Macrodantin .... Take 1 Tablet By Mouth Once A Day 9)  Lantus 100 Unit/ml  Soln (Insulin Glargine) .... Use As Directed. 10)  Humalog Kwikpen 100 Unit/ml  Soln (Insulin Lispro (Human)) .... Use As Directed 11)  Alprazolam 0.5 Mg Tabs (Alprazolam) .... Take 1 By Mouth Three Times A Day As Needed 12)  Actos 15 Mg Tabs (Pioglitazone Hcl) .... Once Daily 13)  Tylenol Extra Strength 500 Mg Tabs (Acetaminophen) .... Take As Needed Headache. 14)  Promethazine-Codeine 6.25-10 Mg/55m Syrp (Promethazine-Codeine) .... Take 1 Teaspoonful By Mouth Every 6 Hours As Needed Cough  Allergies (verified): 1)  ! Codeine 2)  ! * Ultram/ Tramadol  Past History:  Past Medical History: Last updated: 01/15/2009 I B S-DIARRHEAL PREDOMINATE (ICD-564.1) FATTY LIVER DISEASE (ICD-571.8) G E R D (ICD-530.81) Allergic Rhinitis COPD Sciatica Shingles- 2010  Past Surgical History: Last updated: 05/29/2007 open cholecystectomy 1978 liver biopsy 4/94 parathyroid surgery T&A TAH EGD 1991,12/08, H Hernia, es.stricture s/p dil 46F colonoscopy 07/2001, mild diverticulosis  Family History: Last updated: 009/02/11Mother -died smallcell  lung cancer, had Byssinosis Father- died heart disease, age 7523Sister- had lung cancer- died  Social History: Last updated: 06/16/2008 Patient states former smoker.  Divorced  Risk Factors: Smoking Status: quit (0September 02, 2011  Passive Smoke Exposure: yes (12/10/2009)  Family History: Mother -died smallcell  lung cancer, had Byssinosis Father- died heart disease, age 75 Sister- had lung cancer- died  Review of Systems      See HPI       The patient complains of shortness of breath with activity, shortness of breath at rest, and productive cough.   The patient denies non-productive cough, coughing up blood, irregular heartbeats, acid heartburn, indigestion, loss of appetite, weight change, abdominal pain, difficulty swallowing, sore throat, tooth/dental problems, headaches, nasal congestion/difficulty breathing through nose, and sneezing.    Vital Signs:  Patient profile:   75 year old female Height:      63 inches Weight:      201.50 pounds BMI:     35.82 O2 Sat:      92 % on 3 L/min Pulse rate:   105 / minute BP sitting:   140 / 78  (left arm) Cuff size:   large  Vitals Entered By: Clayborne Dana CMA (December 10, 2009 3:51 PM)  O2 Flow:  3 L/min CC: Accute visit-SOB, cough-productive-black/green in color; has had Zpak and Hard to breathe mostly at night.   Physical Exam  Additional Exam:  General: A/Ox3; pleasant and cooperative, NAD, calm, overweight, nonstop talking. Supplemental O2 3 L/M 92% sat SKIN: no rash, lesions NODES: no lymphadenopathy HEENT: [place]/AT, EOM- WNL, Conjuctivae- clear, PERRLA, TM-WNL, Nose- clear, Throat- clear and wnl, dentures, Mallampati III-IV NECK: Supple w/ fair ROM, JVD- none, normal carotid impulses w/o bruits Thyroid-  CHEST: Clear to P&A-not coughing or wheezing today.No definite rhonchi  HEART: RRR, no m/g/r heard ABDOMEN: Soft and nl;  LNL:GXQJ, nl pulses, no edema  NEURO: Grossly intact to observation      Impression & Recommendations:  Problem # 1:  COPD (ICD-496) Acute exacerbation of bronchitis- consider viral, ozone air/quality and the house dust stirred up by her moving to an assisited living facility. We will give neb, depo and doxy. She was a heavy smoker in remote past, but has stopped completely,  Problem # 2:  ALLERGIC RHINITIS (ICD-477.9) Not describing significant rhinitis now despite her acute illness and the dust she reports stirring up. Her updated medication list for this problem includes:    Fluticasone Propionate 50 Mcg/act Susp (Fluticasone propionate) .Marland Kitchen... 1-2  puffs each nostril daily  Medications Added to Medication List This Visit: 1)  Doxycycline Hyclate 100 Mg Caps (Doxycycline hyclate) .... 2 today then one daily  Other Orders: Est. Patient Level III (19417) Depo- Medrol 37m (J1040) Admin of Therapeutic Inj  intramuscular or subcutaneous ((40814  Patient Instructions: 1)  Return for annual appointment or as needed 2)  Depo 80 3)  neb xop 1.25 4)  doxycycline script printed Prescriptions: DOXYCYCLINE HYCLATE 100 MG CAPS (DOXYCYCLINE HYCLATE) 2 today then one daily  #8 x 0   Entered and Authorized by:   CDeneise LeverMD   Signed by:   CDeneise LeverMD on 12/10/2009   Method used:   Print then Give to Patient   RxID::   [id number]     Medication Administration  Injection # 1:    Medication: Depo- Medrol 895m   Diagnosis: COPD (ICD-496)    Route: IM    Site: LUOQ gluteus    Exp Date: 08/06/2012    Lot #: [id number]    Mfr: Pharmacia    Patient tolerated injection without complications    Given by: AlVerdie MosherMA (December 10, 2009 4:46 PM)  Medication # 1:    Medication: Xopenex 1.63m    Diagnosis: COPD (ICD-496)    Dose: 1 vial    Route: inhaled    Exp Date: 01/06/2010    Lot #: [id number]    Mfr: sepracor    Patient tolerated medication without complications    Given by: AVerdie MosherCMA (December 10, 2009 4:49 PM)  Orders Added: 1)  Est. Patient Level III [[12811]2)  Depo- Medrol 813m[J1040] 3)  Admin of Therapeutic Inj  intramuscular or subcutaneous [9[88677]

## 2010-06-09 NOTE — Progress Notes (Signed)
Summary: coughing - to use claritin per CDY  Phone Note Call from Patient Call back at Home Phone [phone]   Caller: Patient Call For: young Summary of Call: Pt states she's been having bronchitis symptoms since last week, coughing, pls advise.//[employer] Initial call taken by: Netta Neat,  January 19, 2010 8:44 AM  Follow-up for Phone Call        Pt last seen 8.25.11, pending OV 10.12.11  Called, spoke with pt.  She c/o coughing x 2 wks and some increased SOB.  States cough prod this am with light green mucus.  Denies f/c/s.  Requesting CY's recs.   Allergies: codiene, ultram/tramadol [facility] Dr. Annamaria Boots, pls advise.  Thanks! Follow-up by: Raymondo Band RN,  January 19, 2010 9:03 AM  Additional Follow-up for Phone Call Additional follow up Details #1::        I favor this being from ragweed,  based on the timing. Less likely a viral cold. Either way, I don't think an antibiotic would help.  OK to try Mucinex DM and try to ride it out with her ususal meds.  Additional Follow-up by: Deneise Lever MD,  January 19, 2010 1:22 PM    Additional Follow-up for Phone Call Additional follow up Details #2::    Called, spoke with pt.  Informed her of above per CY.  She states she does not want to try anything else that's going to get more "fluid" up because she's already "coughing and almost choking to death" from the cough and mucus she is getting up.  States she believes this is the same thing that Cy has been treating her for since the end of July.  States "I've just never gotten better." Dr. Annamaria Boots, pls advise if you would like pt to try anything else.   Raymondo Band RN  January 19, 2010 1:47 PM   Additional Follow-up for Phone Call Additional follow up Details #3:: Details for Additional Follow-up Action Taken: Suggest she try an antihistamine as a different approach. Allegra 60 mg or loratadine could help dry up the  mucus. Additional Follow-up by: Deneise Lever MD,  January 21, 2010 1:22 PM    called spoke with patient, advised of CDYs recs as stated above.  pt stated that has used claritin in the past and is satisfied with trying that again.  pt states she does feel better today and will call back if her symptoms do not improve with CDYs recs.  pt verbalized her understanding. Parke Poisson CNA/MA  January 21, 2010 2:22 PM

## 2010-06-09 NOTE — Assessment & Plan Note (Signed)
Summary: Pulmonary/ ext ov with hfa teaching 90% effective   Primary Provider/Referring Provider:  [name]  CC:  Dr. Annamaria Boots pt. Pt c/o increased productive  cough with dark green to black mucus, nasal drainage during the day and dries out at night, lethargic, nausea, upper back pain between shoulders "raw" throat x 2 weeks ago.Pt states now has "dry hacky" cough. Pt has been treated with zpak, prednisone, Augmentin, and breathing treatment.  History of Present Illness: 74 yowf quit smoking in the 1990s with recurrent bronchitis since childhood and improvment after quit smoking then worse/ persistent since 2009   May 21, 2009- Bronchitis, allergic rhinitis Says some days "struggling" and uses oxygen some every day. Had flu shot. Gets some green from nose. She asks for CT with contrast, afraid of cancer- sister just dx'd metastatic cancer. Gets pains through anterior chest to back with no real pattern, not exertional. No heme or lumps. CXR in September chronic bronchitis.  August 20, 2009-  Bronchitis, allergic rhinitis She says she is about the same. Frequent cough- usually clear, rarely green. Recent Z pak from [name] did help last time it was green. Gets hoarse and raspy- comes and goes. Occasional cough when eating. Hx Dr [name] dilated a stricture. Some wheeze. Seeing a chiropracter for chronic back pain. Not outdoors much in the pollen. She has home oxygen, used intermittently. We discussed indication and goals of home O2.  December 10, 2009- Bronchitis, allergic rhinitis Hasn't felt well for past week. Starting 2 weeks ago she began having pain in upper back. [name] did CXR -negatve .He gave prednisone. She hadn't been coughing. Tired, sluggish, no fever. 6 days ago after working in home to pack for a move, she began coughing. Took cough syrup, then a zpak from primary office. Cough has become productive green. For 2 days wearing O2 continuously. rx doxy  December 23, 2009 Dr.  Annamaria Boots pt. Pt c/o increased productive  cough with dark green to black mucus, nasal drainage during the day and dries out at night, lethargic, nausea, upper back pain between shoulders "raw" throat x 2 weeks ago.Pt states now has "dry hacky" cough. Pt has been treated with zpak, prednisone, Augmentin, breathing treatment > mucus white now, cough no better, still nasal congestion  and cp L shoulder positional not pleuritic.  Pt denies any significant sore throat, dysphagia, itching, sneezing,  fever, chills, sweats     Preventive Screening-Counseling & Management  Alcohol-Tobacco     Smoking Status: quit     Year Started: 1959     Year Quit: 1990     Pack years: 2- 3 ppd x 45 years  Current Medications (verified): 1)  Atrovent Hfa 17 Mcg/act  Aers (Ipratropium Bromide Hfa) .... Inhale 2 Puffs Four Times A Day As Needed 2)  Oxygen 3-3.5l 3)  Fluticasone Propionate 50 Mcg/act  Susp (Fluticasone Propionate) .Marland Kitchen.. 1-2 Puffs Each Nostril Daily 4)  Toprol Xl 25 Mg  Tb24 (Metoprolol Succinate) .... Take 1 Tablet By Mouth Once A Day 5)  Atacand 16 Mg Tabs (Candesartan Cilexetil) .... Take 1 Tablet By Mouth Once A Day 6)  Prilosec 20 Mg  Cpdr (Omeprazole) .... Up To Two Times A Day 7)  Aspirin 325 Mg Tabs (Aspirin) .... Take 1 Tablet By Mouth Once A Day 8)  Macrodantin .... Take 1 Tablet By Mouth Once A Day 9)  Lantus 100 Unit/ml  Soln (Insulin Glargine) .... Use As Directed. 10)  Humalog Kwikpen 100 Unit/ml  Soln (Insulin Lispro (Human)) .... Use As Directed 11)  Alprazolam 0.5 Mg Tabs (Alprazolam) .... Take 1 By Mouth Three Times A Day As Needed 12)  Actos 15 Mg Tabs (Pioglitazone Hcl) .... Once Daily 13)  Tylenol Extra Strength 500 Mg Tabs (Acetaminophen) .... Take As Needed Headache. 14)  Promethazine-Codeine 6.25-10 Mg/77m Syrp (Promethazine-Codeine) .... Take 1 Teaspoonful By Mouth Every 6 Hours As Needed Cough  Allergies (verified): 1)  ! Codeine 2)  ! * Ultram/ Tramadol  Past  History:  Past Medical History: I B S-DIARRHEAL PREDOMINATE (ICD-564.1) FATTY LIVER DISEASE (ICD-571.8) G E R D (ICD-530.81) Allergic Rhinitis     - Sinus CT Rec December 23, 2009  COPD     - HFA 75-90% p coaching December 23, 2009  Sciatica Shingles- 2010  Social History: Patient states former smoker. in 89sDivorced  Vital Signs:  Patient profile:   75year old female Height:      63 inches Weight:      202.50 pounds BMI:     36.00 O2 Sat:      94 % on 3 L/min pulsed Temp:     98.4 degrees F oral Pulse rate:   85 / minute BP sitting:   114 / 60  (left arm) Cuff size:   regular  Vitals Entered By: JIran PlanasCMA (December 23, 2009 10:24 AM)  O2 Sat at Rest %:  94 O2 Flow:  3 L/min pulsed O2 Sat on room air at rest %:  91 CC: Dr. YAnnamaria Bootspt. Pt c/o increased productive  cough with dark green to black mucus, nasal drainage during the day and dries out at night, lethargic, nausea, upper back pain between shoulders "raw" throat x 2 weeks ago.Pt states now has "dry hacky" cough. Pt has been treated with zpak, prednisone, Augmentin, breathing treatment Comments Medications reviewed with patient Verified contact number and pharmacy with patient JIran PlanasCMA  December 23, 2009 10:24 AM    Physical Exam  Additional Exam:  animated elderly wf nad who failed to answer a single question asked in a straightforward manner, tending to go off on tangents or answer questions with ambiguous medical terms or diagnoses and seemed perturbed  when asked the same question more than once for clarification.  HEENT mod/ severe bilateral  turbinate edema with mucoid secretions.  Oropharynx no thrush or excess pnd or cobblestoning.  No JVD or cervical adenopathy. Mild accessory muscle hypertrophy. Trachea midline, nl thryroid. Chest was hyperinflated by percussion with diminished breath sounds and moderate increased exp time without wheeze. Hoover sign positive at mid inspiration. Regular rate  and rhythm without murmur gallop or rub or increase P2 or edema.  Abd: no hsm, nl excursion. Ext warm without cyanosis or clubbing.     Impression & Recommendations:  Problem # 1:  COUGH ([id number]) The most common causes of chronic cough in immunocompetent adults include: upper airway cough syndrome (UACS), previously referred to as postnasal drip syndrome,  caused by variety of rhinosinus conditions; (2) asthma; (3) GERD; (4) chronic bronchitis from cigarette smoking or other inhaled environmental irritants; (5) nonasthmatic eosinophilic bronchitis; and (6) bronchiectasis. These conditions, singly or in combination, have accounted for up to 94% of the causes of chronic cough in prospective studies.   this is most c/w  Classic Upper airway cough syndrome, so named because it's frequently impossible to sort out how much is  CR/sinusitis with freq throat clearing (which can be related to primary GERD)   vs  causing  secondary extra esophageal GERD from wide swings in gastric pressure that occur with throat clearing, promoting self use of mint and menthol lozenges that reduce the lower esophageal sphincter tone and exacerbate the problem further These are the same pts who not infrequently have failed to tolerate ace inhibitors,  dry powder inhalers or biphosphonates or report having reflux symptoms that don't respond to standard doses of PPI  Rx with max ppi/ diet and pursue sinus ct next step  Problem # 2:  CHEST PAIN (ICD-786.50) EKG ok though no cp at time of ov most likely this is MSCP from cough most likely, r/o osteoporotic vertebral fx with cxr  Problem # 3:  COPD (ICD-496) 02 dep, chronically  relatively well compensated but should probably avoid chronic macrodantin rx  in the setting of an oxygen dep illness at baseline with poorly controlled chronic cough.  rec she speak to Dr Alinda Money re substitute  I spent extra time with the patient today explaining optimal mdi  technique.  This improved  from  75-90% with coaching  Prednisone dose pack x 6 days only for present flare ? candidate for qvar if flares each time steroids withdrawn  Medications Added to Medication List This Visit: 1)  Toprol Xl 25 Mg Tb24 (Metoprolol succinate) .... Take 1 tablet by mouth once a day 2)  Atacand 16 Mg Tabs (Candesartan cilexetil) .... Take 1 tablet by mouth once a day 3)  Prilosec 20 Mg Cpdr (Omeprazole) .... Up to two times a day 4)  Prilosec 20 Mg Cpdr (Omeprazole) .... Take one 30-60 min before first and last meals of the day 5)  Prednisone 10 Mg Tabs (Prednisone) .... 4 each am x 2days, 2x2days, 1x2days and stop  Other Orders: T-2 View CXR (71020TC) EKG w/ Interpretation (93000) Est. Patient Level IV (11031) HFA Instruction [phone])  Patient Instructions: 1)  Prilosec 20 mg Take one 30-60 min before first and last meals of the day  2)  Stop macrodantin (it risks making your oxygen worse than it already is) 3)  See Patient Care Coordinator before leaving for sinus ct 4)  Prednisone 4 each am x 2days, 2x2days, 1x2days and stop 5)  Work on inhaler technique:  relax and blow all the way out then take a nice smooth deep breath back in, triggering the inhaler at same time you start breathing in  6)  Please schedule a follow-up appointment in 4  weeks, sooner if needed with Dr Annamaria Boots 7)  GERD (REFLUX)  is a common cause of respiratory symptoms. It commonly presents without heartburn and can be treated with medication, but also with lifestyle changes including avoidance of late meals, excessive alcohol, smoking cessation, and avoid fatty foods, chocolate, peppermint, colas, red wine, and acidic juices such as orange juice. NO MINT OR MENTHOL PRODUCTS SO NO COUGH DROPS  8)  USE SUGARLESS CANDY INSTEAD (jolley ranchers)  9)  NO OIL BASED VITAMINS  Prescriptions: PREDNISONE 10 MG  TABS (PREDNISONE) 4 each am x 2days, 2x2days, 1x2days and stop  #14 x 0   Entered and Authorized by:   Tanda Rockers MD    Signed by:   Tanda Rockers MD on 12/23/2009   Method used:   Electronically to        [facility] (retail)       [address] [place], [place]  [address]  Ph: [phone]       Fax: [phone]   RxID:   [id number]    [facility] ECG  ID: [id number] Patient: CURTINA, GRILLS DOB: Sep 19, 1935 Age: 75 Years Old Sex: Female Race: White Technician: Iran Planas CMA Height: 63 Weight: 202.50 Status: Unconfirmed Past Medical History:  I B S-DIARRHEAL PREDOMINATE (ICD-564.1) FATTY LIVER DISEASE (ICD-571.8) G E R D (ICD-530.81) Allergic Rhinitis COPD Sciatica Shingles- 2010  Recorded: 12/23/2009 11:21 AM P/PR: 108 ms / 143 ms - Heart rate (maximum exercise) QRS: 132 QT/QTc/QTd: 400 ms / 437 ms / 38 ms - Heart rate (maximum exercise)  P/QRS/T axis: 75 deg / 99 deg / 27 deg - Heart rate (maximum exercise)  Heartrate: 81 bpm  Interpretation:   sinus rhythm  vertical axis  RBBB   QRS = 132 ms   RSR' in V1 V2   S > 30 ms in I V5 V6   Borderline ECG    Appended Document: Orders Update     Clinical Lists Changes  Orders: Added new Referral order of Misc. Referral (Misc. Ref) - Signed

## 2010-06-09 NOTE — Assessment & Plan Note (Signed)
Summary: sick per pt/4 month follow up/kcw   Primary Provider/Referring Provider:  [name]  CC:  follow up-[facility] with [facility].Marland Kitchen  History of Present Illness: 12/14/08- Bronchits, allergic rhinitis Complains of chronic cough. Runs oxygen at 3-3.5 L/M. She feels she could do better with different medicine. Stays sore in right lower lateral rib area- previously CT'd. Has had shingles right buttock. Has also had right sciatica. Dr Junius Roads has given prednisone and "shot". Prednisone helped her cough.  01/15/09- Bronchitis, allergic rhinitis Feels "about the same". Sample Qvar didn't seem to help. Xopenex caused stimulation. Says she still has hacking cough and did cough very hard with a small speck of blood once. Reviewed PFT- Moderate COPD FEV1/FVC 0.48, airtrapping, reduced DLCO Last CXR was elsewhere over a year ago.  01/28/09- Bronchitis, allergic rhinitis.She had called to say right chest pain was gone, then 5 days ago preparing breakfast she felt "like fire" across right anterior chest. She held onto sink and pain faded away. By next day she was aware of aching pain persitant several days in right lateral ribs, which has also now faded after 2-3 days. No wheeze but still cough. Prednisone helped the cough. She asks about moving to [place]. Says she has shingles on right buttock- post herpetic neuralgia- treated.  May 21, 2009- Bronchitis, allergic rhinitis Says some days "struggling" and uses oxygen some every day. Had flu shot. Gets some green from nose. She asks for CT with contrast, afraid of cancer- sister just dx'd metastatic cancer. Gets pains through anterior chest to back with no real pattern, not exertional. No heme or lumps. CXR in September chronic bronchitis.    Current Medications (verified): 1)  Atrovent Hfa 17 Mcg/act  Aers (Ipratropium Bromide Hfa) .... Inhale 2 Puffs Four Times A Day As Needed 2)  Oxygen 3-3.5l 3)  Fluticasone Propionate 50 Mcg/act  Susp  (Fluticasone Propionate) .Marland Kitchen.. 1-2 Puffs Each Nostril Daily 4)  Toprol Xl 25 Mg  Tb24 (Metoprolol Succinate) 5)  Atacand 32 Mg  Tabs (Candesartan Cilexetil) 6)  Prilosec 20 Mg  Cpdr (Omeprazole) 7)  Aspirin 325 Mg Tabs (Aspirin) .... Take 1 Tablet By Mouth Once A Day 8)  Macrodantin .... Take 1 Tablet By Mouth Once A Day 9)  Lantus 100 Unit/ml  Soln (Insulin Glargine) .... Use As Directed. 10)  Humalog Kwikpen 100 Unit/ml  Soln (Insulin Lispro (Human)) .... Use As Directed 11)  Alprazolam 0.5 Mg Tabs (Alprazolam) .... Take 1 By Mouth Three Times A Day As Needed 12)  Actos 15 Mg Tabs (Pioglitazone Hcl) .... Once Daily 13)  Tylenol Extra Strength 500 Mg Tabs (Acetaminophen) .... Take As Needed Headache.  Allergies (verified): 1)  ! Codeine 2)  ! * Ultram/ Tramadol  Past History:  Past Medical History: Last updated: 01/15/2009 I B S-DIARRHEAL PREDOMINATE (ICD-564.1) FATTY LIVER DISEASE (ICD-571.8) G E R D (ICD-530.81) Allergic Rhinitis COPD Sciatica Shingles- 2010  Past Surgical History: Last updated: 05/29/2007 open cholecystectomy 1978 liver biopsy 4/94 parathyroid surgery T&A TAH EGD 1991,12/08, H Hernia, es.stricture s/p dil 69F colonoscopy 07/2001, mild diverticulosis  Family History: Last updated: 2008/06/24 Mother -died lung cancer, had Byssinosis Father- died heart disease, age 95  Social History: Last updated: 06-24-2008 Patient states former smoker.  Divorced  Risk Factors: Smoking Status: quit (07/16/2007) Passive Smoke Exposure: yes (11/15/2007)  Review of Systems      See HPI  The patient denies anorexia, fever, weight loss, weight gain, vision loss, decreased hearing, hoarseness, chest pain, syncope, dyspnea  on exertion, peripheral edema, prolonged cough, headaches, hemoptysis, and severe indigestion/heartburn.    Vital Signs:  Patient profile:   75 year old female Height:      63 inches Weight:      213.50 pounds BMI:     37.96 O2 Sat:      9 %  on Room air Pulse rate:   91 / minute BP sitting:   120 / 68  (left arm) Cuff size:   large  Vitals Entered By: Clayborne Dana CMA (May 21, 2009 11:39 AM)  O2 Flow:  Room air  Physical Exam  Additional Exam:  General: A/Ox3; pleasant and cooperative, NAD, calm, nonstop talking, obese SKIN: no rash, lesions NODES: no lymphadenopathy HEENT: [place]/AT, EOM- WNL, Conjuctivae- clear, PERRLA, TM-WNL, Nose- clear, Throat- clear and wnl, dentures, Melampatti III-IV NECK: Supple w/ fair ROM, JVD- none, normal carotid impulses w/o bruits Thyroid-  CHEST: Clear to P&A-dry cough HEART: RRR, no m/g/r heard ABDOMEN: Soft and nl;  TDH:RCBU, nl pulses, no edema  NEURO: Grossly intact to observation      Impression & Recommendations:  Problem # 1:  COPD (ICD-496) She has family hx of lung cancer and chronic bronchitis. We will recheck CXR and consider CT after that.  Medications Added to Medication List This Visit: 1)  Tylenol Extra Strength 500 Mg Tabs (Acetaminophen) .... Take as needed headache.  Other Orders: Est. Patient Level II (38453) T-2 View CXR (64680HO)  Patient Instructions: 1)  Please schedule a follow-up appointment in 3 months. 2)  A chest x-ray has been recommended.  Your imaging study may require preauthorization.

## 2010-06-09 NOTE — Assessment & Plan Note (Signed)
Summary: acid reflux/[name]    History of Present Illness Visit Type: Initial Visit Primary GI MD: Delfin Edis MD Primary Provider: Dr Ruffin Frederick  Requesting Provider: n/a Chief Complaint: Patient c/o years gerd and was told recently by Dr Janace Hoard that her "voicebox is getting irritated" due to GERD. Patient does c/o chest pain. History of Present Illness:   [name] is a 75 year old female followed by Dr. [name] for  irritable bowel syndrome/diarrhea and steatohepatitis. Patient has developed problems with hoarseness and cough, felt by ENT to possibly be secondary to reflux. On Prilosec twice daily and seldom gets any GERD symptoms.  Cough is mainly non-productive and often preceded by a spasm in left throat. Sinus films in August were normal. Patient does decribe drainage down back of her throat.  She has a  history of bronchitis and required antibiotics two weeks ago.  At that time Dr. Melvyn Novas increased Prilosec to twice daily. No improvement in cough or hoarseness.   Patient has history of IBS, diarrhea predominant. Her diarrhea had been under control until last week. Now having recurrent diarrhea with pre-defeatory pain. PCP recently gave her Bentyl  and it works wonders but patient concerned about taking Bentyl given recently diagosed  glaucoma of left eye.    GI Review of Systems      Denies abdominal pain, acid reflux, belching, bloating, chest pain, dysphagia with liquids, dysphagia with solids, heartburn, loss of appetite, nausea, vomiting, vomiting blood, weight loss, and  weight gain.      Reports diarrhea.     Denies anal fissure, black tarry stools, change in bowel habit, constipation, diverticulosis, fecal incontinence, heme positive stool, hemorrhoids, irritable bowel syndrome, jaundice, light color stool, liver problems, rectal bleeding, and  rectal pain. Preventive Screening-Counseling & Management  Caffeine-Diet-Exercise     Does Patient Exercise: no      Drug Use:  no.      Current Medications (verified): 1)  Atrovent Hfa 17 Mcg/act  Aers (Ipratropium Bromide Hfa) .... Inhale 2 Puffs Four Times A Day As Needed 2)  Oxygen 3-3.5l 3)  Fluticasone Propionate 50 Mcg/act  Susp (Fluticasone Propionate) .Marland Kitchen.. 1-2 Puffs Each Nostril Daily 4)  Toprol Xl 25 Mg  Tb24 (Metoprolol Succinate) .... Take 1 Tablet By Mouth Once A Day 5)  Atacand 16 Mg Tabs (Candesartan Cilexetil) .... Take 1 Tablet By Mouth Once A Day 6)  Prilosec 20 Mg  Cpdr (Omeprazole) .... Take One 30-60 Min Before First and Last Meals of The Day 7)  Aspirin 325 Mg Tabs (Aspirin) .... Take 1 Tablet By Mouth Once A Day 8)  Lantus 100 Unit/ml  Soln (Insulin Glargine) .... Take 34 Units Every Morning and 31 Units At Night. 9)  Humalog Kwikpen 100 Unit/ml  Soln (Insulin Lispro (Human)) .... Sliding Scale (Three Times A Day) 10)  Alprazolam 0.5 Mg Tabs (Alprazolam) .... Take 1 By Mouth Three Times A Day As Needed 11)  Actos 30 Mg Tabs (Pioglitazone Hcl) .... Take 1 By Mouth Once Daily 12)  Tylenol Extra Strength 500 Mg Tabs (Acetaminophen) .... Take As Needed Headache. 13)  Promethazine-Codeine 6.25-10 Mg/41m Syrp (Promethazine-Codeine) .... Take 1 Teaspoonful By Mouth Every 6 Hours As Needed Cough 14)  Astepro 0.15 % Soln (Azelastine Hcl) ..Marland Kitchen. 1-2 Sprays in Each Nostril Up To Twice Daily As Needed For Watery Nose 15)  Lescol 20 Mg Caps (Fluvastatin Sodium) .... Take 1 Tablet By Mouth 5 Days Per Week 16)  Bentyl 10 Mg Caps (Dicyclomine Hcl) ..Marland KitchenMarland KitchenMarland Kitchen  Take 1 Capsule By Mouth As Needed 17)  Keflex 250 Mg Caps (Cephalexin) .... Take 1 Capsule By Mouth At Bedtime 18)  Timolol Maleate 0.5 % Soln (Timolol Maleate) .... Instill 2 Drops in Left Eye Once Daily 19)  Vitamin D 1000 Unit Tabs (Cholecalciferol) .... Take 1 Tablet By Mouth Two Times A Day 20)  Claritin 10 Mg Tabs (Loratadine) .... Take 1 Tablet By Mouth As Needed  Allergies: 1)  ! Codeine 2)  ! * Ultram/ Tramadol  Past History:  Past Medical  History: Reviewed history from 12/23/2009 and no changes required. I B S-DIARRHEAL PREDOMINATE (ICD-564.1) FATTY LIVER DISEASE (ICD-571.8) G E R D (ICD-530.81) Allergic Rhinitis     - Sinus CT Rec December 23, 2009  COPD     - HFA 75-90% p coaching December 23, 2009  Sciatica Shingles- 2010  Past Surgical History: open cholecystectomy 1978 liver biopsy 4/94 parathyroid surgery T&A TAH EGD 1991,12/08, H Hernia, es.stricture s/p dil 81F colonoscopy 07/2001, mild diverticulosis Carpal Tunnel Release-right hand Cataract Extraction-Bilateral  Family History: Mother -died smallcell  lung cancer, had Byssinosis Father- died heart disease, age 12 Sister- had lung cancer- died Family History of Liver Cancer: Sister (? mets from another area of the body) Family History of Diabetes: Grandmother  Social History: Patient states former smoker. in 1990s Divorced Alcohol Use - no Daily Caffeine Use-1 1/2 cup daily Illicit Drug Use - no Patient does not get regular exercise.  Drug Use:  no Does Patient Exercise:  no  Review of Systems       The patient complains of allergy/sinus, arthritis/joint pain, back pain, change in vision, cough, fatigue, muscle pains/cramps, shortness of breath, skin rash, and urination - excessive.  The patient denies anemia, anxiety-new, blood in urine, breast changes/lumps, confusion, coughing up blood, depression-new, fainting, fever, headaches-new, hearing problems, heart murmur, heart rhythm changes, itching, menstrual pain, night sweats, nosebleeds, pregnancy symptoms, sleeping problems, sore throat, swelling of feet/legs, swollen lymph glands, thirst - excessive , urination - excessive , urination changes/pain, urine leakage, vision changes, and voice change.    Vital Signs:  Patient profile:   75 year old female Height:      63 inches Weight:      203.38 pounds BMI:     36.16 BSA:     1.95 Pulse rate:   84 / minute Pulse rhythm:   regular BP sitting:    124 / 64  (right arm) Cuff size:   regular  Vitals Entered By: Madlyn Frankel CMA Deborra Medina) (March 16, 2010 9:55 AM)  Physical Exam  General:  Pleasant, talkative obese white female. Head:  Normocephalic and atraumatic. Eyes:  Conjunctiva pink, no icterus.  Mouth:  No oral lesions. Tongue moist.  Neck:  no obvious masses  Lungs:  Clear throughout to auscultation. Heart:  Regular rate and rhythm Abdomen:  Abdomen soft, nontender, nondistended. No obvious masses or hepatomegaly.Normal bowel sounds.  Extremities:  No palmar erythema, no edema.  Neurologic:  Alert and  oriented x4;  grossly normal neurologically. Skin:  Without significant lesions or rashes. Under left breast patient shows me a patch of red lesions. They are flat, not vesicular.  Cervical Nodes:  No significant cervical adenopathy. Psych:  Alert and cooperative. Normal mood and affect.   Impression & Recommendations:  Problem # 1:  HOARSENESS (HDQ-222.97) Assessment Deteriorated Saw ENT(Dr. Janace Hoard) a week ago for cough / hoarse voice. Found to have interanrytennoid edema on laryngoscopy.  Patient on chronic PPI, twice  daily since August. She has no typical manifestations of GERD on PPI. Perhaps she is having extraintestinal manifestations of GERD. Will try changing PPI to twice daily Nexium.   If cough / hoarseness continue patient may need EGD, though lack of endoscopic findings doesn't necessarily rule out reflux. Another option would be 24hour pH probe. She will call us in 3-4 weeks with condition update and if not improved, will proceed with one of the aforementioned.   Problem # 2:  COUGH (ICD-786.2) Assessment: Comment Only Chronic cough / history of bronchitis/ COPD under care of [facility] who feels GERD may be contributing to cough as well.   Problem # 3:  G E R D (ICD-530.81) See #1.  Problem # 4:  I B S-DIARRHEAL PREDOMINATE (ICD-564.1) Assessment: Comment Only PCP recently started her on  Bentyl which really helps pre-defecatory discomfort. Patient recently diagnosed with glaucoma in left eye. We called her opthamologist and he is okay with Bentyl based on her type of glaucoma.   Problem # 5:  FATTY LIVER DISEASE (ICD-571.8) Assessment: Comment Only  Patient Instructions: 1)  We have given you samples of Nexium.. Take 1 capsule 30 min before breakfast and dinner.  2)  You can take 1 Zantac tablet at bedtime.  3)  Dr. Zenia Resides office is calling me back and we will  let you know what Dr. Katy Fitch said about taking the Bentyl medication. 4)  We have given you anti-reflux brochures and a Reflux diet brochure. 5)  Please call us in 3-4 week and let us know how you are doing and how the Nexium is working for you. Call [name] at [phone].  6)  Copy sent to : Dr. Ruffin Frederick 7)  The medication list was reviewed and reconciled.  All changed / newly prescribed medications were explained.  A complete medication list was provided to the patient / caregiver.

## 2010-06-09 NOTE — Medication Information (Signed)
Summary: Prior [employer] for Prometh/Condeine/Schepp Bing Plume  Prior [employer] for [employer]   Imported By: Phillis Knack 10/14/2009 11:38:32  _____________________________________________________________________  External Attachment:    Type:   Image     Comment:   External Document

## 2010-06-09 NOTE — Assessment & Plan Note (Signed)
Summary: rov 3 months///kp   Primary Provider/Referring Provider:  [name]  CC:  Follow up visit-cough and Increased SOB; had bad episode this morning.Marland Kitchen  History of Present Illness:  01/28/09- Bronchitis, allergic rhinitis.She had called to say right chest pain was gone, then 5 days ago preparing breakfast she felt "like fire" across right anterior chest. She held onto sink and pain faded away. By next day she was aware of aching pain persitant several days in right lateral ribs, which has also now faded after 2-3 days. No wheeze but still cough. Prednisone helped the cough. She asks about moving to [place]. Says she has shingles on right buttock- post herpetic neuralgia- treated.  May 21, 2009- Bronchitis, allergic rhinitis Says some days "struggling" and uses oxygen some every day. Had flu shot. Gets some green from nose. She asks for CT with contrast, afraid of cancer- sister just dx'd metastatic cancer. Gets pains through anterior chest to back with no real pattern, not exertional. No heme or lumps. CXR in September chronic bronchitis.  2009-09-02-  Bronchitis, allergic rhinitis She says she is about the same. Frequent cough- usually clear, rarely green. Recent Z pak from [name] did help last time it was green. Gets hoarse and raspy- comes and goes. Occasional cough when eating. Hx Dr [name] dilated a stricture. Some wheeze. Seeing a chiropracter for chronic back pain. Not outdoors much in the pollen. She has home oxygen, used intermittently. We discussed indication and goals of home O2.   Current Medications (verified): 1)  Atrovent Hfa 17 Mcg/act  Aers (Ipratropium Bromide Hfa) .... Inhale 2 Puffs Four Times A Day As Needed 2)  Oxygen 3-3.5l 3)  Fluticasone Propionate 50 Mcg/act  Susp (Fluticasone Propionate) .Marland Kitchen.. 1-2 Puffs Each Nostril Daily 4)  Toprol Xl 25 Mg  Tb24 (Metoprolol Succinate) 5)  Atacand 32 Mg  Tabs (Candesartan Cilexetil) 6)  Prilosec 20 Mg  Cpdr  (Omeprazole) 7)  Aspirin 325 Mg Tabs (Aspirin) .... Take 1 Tablet By Mouth Once A Day 8)  Macrodantin .... Take 1 Tablet By Mouth Once A Day 9)  Lantus 100 Unit/ml  Soln (Insulin Glargine) .... Use As Directed. 10)  Humalog Kwikpen 100 Unit/ml  Soln (Insulin Lispro (Human)) .... Use As Directed 11)  Alprazolam 0.5 Mg Tabs (Alprazolam) .... Take 1 By Mouth Three Times A Day As Needed 12)  Actos 15 Mg Tabs (Pioglitazone Hcl) .... Once Daily 13)  Tylenol Extra Strength 500 Mg Tabs (Acetaminophen) .... Take As Needed Headache.  Allergies (verified): 1)  ! Codeine 2)  ! * Ultram/ Tramadol  Past History:  Past Medical History: Last updated: 01/15/2009 I B S-DIARRHEAL PREDOMINATE (ICD-564.1) FATTY LIVER DISEASE (ICD-571.8) G E R D (ICD-530.81) Allergic Rhinitis COPD Sciatica Shingles- 2010  Past Surgical History: Last updated: 05/29/2007 open cholecystectomy 1978 liver biopsy 4/94 parathyroid surgery T&A TAH EGD 1991,12/08, H Hernia, es.stricture s/p dil 1F colonoscopy 07/2001, mild diverticulosis  Family History: Last updated: 09/02/2009 Mother -died smallcell  lung cancer, had Byssinosis Father- died heart disease, age 4 Sister- has lung cancer  Social History: Last updated: 06/16/2008 Patient states former smoker.  Divorced  Risk Factors: Smoking Status: quit (07/16/2007) Passive Smoke Exposure: yes (11/15/2007)  Family History: Mother -died smallcell  lung cancer, had Byssinosis Father- died heart disease, age 11 Sister- has lung cancer  Review of Systems      See HPI  The patient denies anorexia, fever, weight loss, weight gain, vision loss, decreased hearing, hoarseness, chest  pain, syncope, dyspnea on exertion, peripheral edema, prolonged cough, headaches, hemoptysis, abdominal pain, and severe indigestion/heartburn.    Vital Signs:  Patient profile:   75 year old female Height:      63 inches Weight:      211.13 pounds BMI:     37.54 O2 Sat:      90  % on Room air Pulse rate:   85 / minute BP sitting:   134 / 86  (left arm) Cuff size:   large  Vitals Entered By: Clayborne Dana CMA (August 20, 2009 11:10 AM)  O2 Flow:  Room air  Physical Exam  Additional Exam:  General: A/Ox3; pleasant and cooperative, NAD, calm, overweight SKIN: no rash, lesions NODES: no lymphadenopathy HEENT: [place]/AT, EOM- WNL, Conjuctivae- clear, PERRLA, TM-WNL, Nose- clear, Throat- clear and wnl, dentures, Mallampati III-IV NECK: Supple w/ fair ROM, JVD- none, normal carotid impulses w/o bruits Thyroid-  CHEST: Clear to P&A-not coughing or wheezing today. HEART: RRR, no m/g/r heard ABDOMEN: Soft and nl;  WLK:HVFM, nl pulses, no edema  NEURO: Grossly intact to observation      Impression & Recommendations:  Problem # 1:  COPD (ICD-496) Dyspnea and chronic bronchitis cough have not changed much. She would do well to get a little more exercise. She is using oxygen irregularly. If that patern persists we will repeat ONOX to reassess need for O2.  Problem # 2:  G E R D (ICD-530.81) Discussed her hx of stricture, reflux and LPR as basis for some of her cough. Her updated medication list for this problem includes:    Prilosec 20 Mg Cpdr (Omeprazole)  Other Orders: Est. Patient Level III (73403)  Patient Instructions: 1)  Please schedule a follow-up appointment in 6 months. 2)  Call for med refills as needed. 3)  You may do better if you use the oxygen more regularly especially for sleep.

## 2010-06-09 NOTE — Progress Notes (Signed)
Summary: sick  Phone Note Call from Patient Call back at Home Phone [phone]   Caller: Patient Call For: young Reason for Call: Talk to Nurse Summary of Call: pt c/o being real sick, cough since Sat.,throat raw,nauseated, huge amounts of green plegm, took a zpac but no help, weak, sob, Dr. Dwyane Dee said call CDY  Initial call taken by: Zigmund Gottron,  December 09, 2009 3:23 PM  Follow-up for Phone Call        Pt reports sorethroat, prod cough (green), pain in shoulders and across back worse in left shoulder blade.  Finished Zpak today .  Taking Phenergan with Codeine cough med.  [name] called in Pred taper to start today and suggested pt call Dr young for recommendations.  Please advise. Doroteo Glassman RN  December 09, 2009 3:34 PM   Additional Follow-up for Phone Call Additional follow up Details #1::        Please offer generic augmentin 875 mg, # 14 1 two times a day  Additional Follow-up by: Deneise Lever MD,  December 09, 2009 4:44 PM    Additional Follow-up for Phone Call Additional follow up Details #2::    Spoke with pt and offered abx- pt not happy with this.  She states that she would rather come in for ov b/c her medical dr told her she probably has PNA.  I sched appt for 12/10/09 at 3:15 pm.  Adivsed that she go to ER soon if needed.  Pt verbalized understanding. Follow-up by: Tilden Dome,  December 09, 2009 4:52 PM

## 2010-06-09 NOTE — Progress Notes (Signed)
Summary: continue astepro or refer to ENT?  Phone Note Call from Patient Call back at [facility] Phone [phone]   Caller: Patient Call For: young Summary of Call: pt requests to speak to nurse re: ASTEPRO NASAL SPRAY.  Initial call taken by: Cooper Render, CNA,  January 05, 2010 10:35 AM  Follow-up for Phone Call        called spoke with patient who states that the astepro was working well but over the weekend she began having nasal congestion with clear drainage, PND causing prod cough.  should she continue the astepro or does she need to be referred to ENT?  does not want to go to [facility] E[facility] (had a bad experience there).  please advise, thanks! Parke Poisson CNA/MA  January 05, 2010 10:43 AM   Additional Follow-up for Phone Call Additional follow up Details #1::        Not a surgical issue. Has she run out of Astepro? If so, then please refill.  Ragweed counts are up, or she may be catching a cold.  Suggest she also increase her Flonase/ fluticasone to 2 sprays each nostril twice every day for a week or so to see if that plus Astepro will control this. Additional Follow-up by: Deneise Lever MD,  January 05, 2010 1:47 PM    Additional Follow-up for Phone Call Additional follow up Details #2::    The patient does need refill on Astepro. She had verbal understanding to increased the flonase to 2 puffs each nostril twice daily and to use the Astepro to see if this will control her symptoms. She will call if this does not help or her symtoms get worse.Francesca Jewett CMA  January 05, 2010 2:14 PM  New/Updated Medications: ASTEPRO 0.15 % SOLN (AZELASTINE HCL) 1-2 sprays in each nostril up to twice daily as needed for watery nose Prescriptions: ASTEPRO 0.15 % SOLN (AZELASTINE HCL) 1-2 sprays in each nostril up to twice daily as needed for watery nose  #1 x 3   Entered by:   Francesca Jewett CMA   Authorized by:   Deneise Lever MD   Signed by:   Francesca Jewett CMA on 01/05/2010   Method used:    Electronically to        [facility] (retail)       [address] [place], [place]  [address]       Ph: [phone]       Fax: [phone]   RxID:   [id number]

## 2010-06-24 ENCOUNTER — Telehealth (INDEPENDENT_AMBULATORY_CARE_PROVIDER_SITE_OTHER): Payer: Self-pay | Admitting: *Deleted

## 2010-06-29 NOTE — Progress Notes (Signed)
Summary: cough- pt called x 2 -LMTCBx1--WAITING ON CY TO GIVE RX APPROVAL  Phone Note Call from Patient Call back at Home Phone [phone]   Caller: Patient Call For: young Summary of Call: Pt c/o coughing since yesterday says she noticed a little green sputum wants a z-pak called in pls advise.//[employer] Initial call taken by: Netta Neat,  June 24, 2010 8:16 AM  Follow-up for Phone Call        pt called again. says she needs the nurse to call zpac in ASAP because she has to leave now for the grocery store and then will go by the pharmacy to get her zpac. she says it's going to snow and she has never driven in the snow and she is "very worried". note: she lives a short distance from our office. ?? anyway, i advised her to waiti until she heard from the nurse before she goes to the pharmacy and has to wait. (i'm pretty sure she will call back shortly if this isn't called in soon). Cooper Render, CNA  June 24, 2010 10:13 AM  Additional Follow-up for Phone Call Additional follow up Details #1::        LMTCBx1 to get symptoms. [place] Bing CMA  June 24, 2010 11:18 AM  Returing call.Netta Neat  June 24, 2010 12:23 PM Patient phoned to advise that her pcp called in the zpack for her she wasnt sure that we would get to it before dark and she cant drive after dark. Ozella Rocks  June 24, 2010 1:54 PM      Additional Follow-up for Phone Call Additional follow up Details #2::    PER SUSAN NOTHING FURTHER IS NEEDED FROM [place] PER PT Follow-up by: [place],  June 24, 2010 1:56 PM

## 2010-08-19 ENCOUNTER — Telehealth: Payer: Self-pay | Admitting: Internal Medicine

## 2010-08-19 MED ORDER — AZITHROMYCIN 250 MG PO TABS: ORAL_TABLET | ORAL | Status: AC

## 2010-08-19 NOTE — Telephone Encounter (Signed)
Ok to send a Z pak

## 2010-08-19 NOTE — Telephone Encounter (Signed)
Spoke w/ pt and she c/o cough w/ green phlem, some increased SOB. Pt states she believes she has bronchitis. Pt states she had a cough on and off during the day yesterday but wasn't getting anything up until last night. Pt has been taking her promethazine w/ codeine cough syrup as needed and using oxygen 2.5- 3 liters as needed as well. Pt requesting zpack be called in before she gets any worse. Pt has upcoming apt 10/31/10. Please advise Dr. Annamaria Boots. Thanks  Allergies  Allergen Reactions  . Codeine     REACTION: gi upset    Charma Igo, CMA

## 2010-08-19 NOTE — Telephone Encounter (Signed)
Advised pt rx was sent to pharmacy and pt verbalized understanding

## 2010-09-20 NOTE — Assessment & Plan Note (Signed)
[facility]                             PULMONARY OFFICE NOTE   NAME:[name], [name]                     MRN:          [id number]  DATE:09/19/2006                            DOB:          04/12/1936    PROBLEM:  1. Asthmatic bronchitis.  2. Allergic rhinitis.  3. Vasomotor rhinitis.  4. Esophageal reflux.  5. Diverticulitis/irritable bowel.  6. Diabetes.   HISTORY:  She could not tell if Spiriva made much difference one way or  the other. We discussed maintenance versus intermittent therapies.  Symbicort seemed to race her heart, although she says a stress test a  year ago was normal. She uses Atrovent without problems but asks about  nonstimulating medications for her lungs. Little sputum. No routine  wheeze. What she really focuses on is that at times she gets a head of  herself, gets tired out and will have to sit down in a chair. At those  times, she puts oxygen on. I have talked with her about fitness and  stamina as opposed to lung deficiency.   MEDICATIONS:  1. Toprol 25 mg.  2. Aspirin 81 mg.  3. Insulin.  4. Atrovent 2 puffs q.i.d. p.r.n.  5. Actos 15 mg.  6. Oxygen at 2 liters.  7. Atacand.  8. Meclizine 25 mg.   No medication allergies.   OBJECTIVE:  Weight 208 pounds, BP 122/66, pulse regular 78, room air  saturation 94%. She is talkative, assertive and appears quite  comfortable.  LUNGS:  Fields are clear. Air flow is slowed, but she is not needing  accessory muscles or pursed lips. There is no dullness.  HEART:  Sounds are regular without murmurs, rubs, or gallops. No neck  vein distention or stridor. No peripheral edema.   IMPRESSION:  Stable chronic asthmatic bronchitis. Seasonal rhinitis has  not flared this year.   PLAN:  She will continue with her current medications including Claritin  p.r.n. I have suggested she try Ental 2 puffs q.i.d. Schedule return in  4 months, earlier p.r.n.     Clinton D. Annamaria Boots, MD,  [place], [place]  Electronically Signed    CDY/MedQ  DD: 09/19/2006  DT: 09/19/2006  Job #: [id number]   cc:   [name], M.D.  Lindaann Slough, M.D.  Octavia Heir, MD

## 2010-09-20 NOTE — Assessment & Plan Note (Signed)
[place]                             PULMONARY OFFICE NOTE   NAME:[name], [name]                     MRN:          [id number]  DATE:01/17/2007                            DOB:          01-18-1936    PULMONARY FOLLOW-UP:   PROBLEMS:  1. Asthmatic bronchitis.  2. Allergic rhinitis.  3. Vasomotor rhinitis.  4. Esophageal reflux.  5. Diverticulitis/irritable bowel.  6. Diabetes.   HISTORY:  Complains of some palpitations and cardiology gave her a  suggested medication on paper.  She lost the paper but had wanted to get  confirmation from Dr. Dwyane Dee and me that it was okay to use with her  other medications.  Complains of rhinorrhea with occasional specks of  green.  I explained this was not representing a major sinus infection at  this time.   OBJECTIVE:  Weight 208 pounds, BP 110/72, pulse 79, room air saturation  93%.  Dry cough.  Talkative.  Heart sounds regular, no murmur.  No rales, wheezes or rhonchi.  No peripheral edema.   IMPRESSION:  1. Asthmatic bronchitis/moderate chronic obstructive pulmonary      disease.  2. Allergic rhinitis.   PLAN:  She is going to check again with cardiology about the name of the  suggested blood pressure medicine.  We discussed her Toprol and we  discussed other classes of bronchodilator medications that might cause a  problem. Schedule return in 6 months, earlier p.r.n.     Clinton D. Annamaria Boots, MD, [facility], [place]  Electronically Signed    CDY/MedQ  DD: 01/17/2007  DT: 01/18/2007  Job #: [id number]   cc:   [name], M.D.  Octavia Heir, MD  Lindaann Slough, M.D.

## 2010-09-20 NOTE — Assessment & Plan Note (Signed)
[place] OFFICE NOTE   NAME:Swailes, EATHEL PAJAK                     MRN:          [id number]  DATE:04/08/2007                            DOB:          22-Mar-1936    [name] is a 75 year old white female whom we have followed over the  years for irritable bowel syndrome/diarrhea, steatohepatitis documented  on liver biopsy in April 1994, and for gastroesophageal reflux disease.  Last upper endoscopy was actually in 1991.  She had prior open  cholecystectomy in 1978.  Last colonoscopy in March 2003 showed mild  diverticulosis of the left colon.  She had a thorough evaluation for  diarrhea in 1993, including 24 hour urine for HIAA antigliadin  antibodies and stool cultures.  [name] is a diabetic followed by Dr.  Dwyane Dee.  She is insulin dependent.  She has peripheral neuropathy and  suspected autonomic neuropathy as well, which may cause some of her  fecal incontinence.  She is here today because of progression of right  upper quadrant abdominal pain, which starts around the epigastric area  and radiates around the right costal margin.  It has been markedly  improved after increasing her Prilosec to 20 mg p.o. b.i.d. and adding  Reglan by Dr. Dwyane Dee of 10 mg 1/2 tablet before each meal and 1/2 at  bedtime.  She denies dysphagia or odynophagia.   MEDICATIONS:  1. Toprol 25 mg p.o. daily.  2. Atacand 32 mg p.o. daily.  3. Prilosec 20 mg p.o. b.i.d.  4. Humulin and Humalog as directed.  5. Reglan 10 mg 1/2 tablet q.i.d.  6. Aspirin 81 mg p.o. daily.  7. Atrovent 12.9 g 2 puffs p.r.n.  8. Actos 50 mg p.o. daily.  9. Darvocet 1/2 tablet q.6h p.r.n.  10.Dicyclomine 1 or 2 capsules t.i.d. p.r.n. diarrhea.   PHYSICAL EXAM:  Blood pressure 118/64, pulse 72, weight 212 pounds.  The patient was in no distress.  She was clearly overweight.  LUNGS:  Clear to auscultation.  NECK:  Thick.  Thyroid is not enlarged.  COR:  Normal S1, normal S2.  ABDOMEN:  Large, protuberant, obese with cholecystectomy scar in the  right upper quadrant.  There was tenderness in the right costovertebral  angle overlying the posterior ribs and tenderness in the epigastrium  along the right costal margin.  Bowel sounds were normoactive, but there  were no abnormal rushes.  Lower abdomen was unremarkable.  Liver edge at  costal margin.  Splenic tip was difficult to feel because of the  patient's size.  There was no ascites.  RECTAL:  Normal rectal tone.  There was no stool in the rectal vault.  Mucus was Hemoccult negative.   IMPRESSION:  23. A 75 year old white female with right upper quadrant abdominal      discomfort with known history of gastroesophageal reflux disease      and irritable bowel syndrome/diarrhea.  She has been improved on      gastroesophageal reflux disease regimen, but she continues to have      spastic colon with a contribution of distal neuropathy causing some  urge incontinence.  2. Known steatohepatitis.  Some of the right upper quadrant discomfort      may be related to mild hepatomegaly, although her liver function      tests have been normal.   PLAN:  1. Upper abdominal ultrasound with attention to the liver.  Rule out      signs of portal hypertension.  2. Upper endoscopy scheduled.  3. Continue Prilosec 20 mg p.o. b.i.d.  Samples given.  4. Continue Reglan as per Dr. Dwyane Dee.  Her last colonoscopy was 5 years      ago.  She, at this point, is not due for repeat exam.     Lowella Bandy. [name], MD  Electronically Signed    DMB/MedQ  DD: 04/08/2007  DT: 04/08/2007  Job #: [id number]   cc:   [name], M.D.

## 2010-09-23 NOTE — Assessment & Plan Note (Signed)
[place]                             PULMONARY OFFICE NOTE   NAME:[name], [name] EBEY                     MRN:          [id number]  DATE:05/23/2006                            DOB:          [birth year]    PROBLEMS:  1. Asthmatic bronchitis.  2. Allergic rhinitis.  3. Vasomotor rhinitis.  4. Esophageal reflux.  5. Diverticulosis/irritable bowel.  6. Diabetes.   HISTORY:  She returns saying she is ok, just tired. Symbicort caused  tachycardia so she quit, but it did help her breathing. She is now using  Qvar 40 mg 2 puffs b.i.d. She had chronic obstructive pulmonary disease  on her pulmonary function test in 2006. Head congestion persists and she  does have some cough. She complaints [employer]Tank oxygen is too heavy and says  when she tries to sleep with it at night, she gets tangled up in the  oxygen cord and takes it off. She complains of fatigue and dyspnea with  activities of daily living. She then tells me how she tries to keep busy  cleaning her home, working in the yard, [employer] forces training and education [employer]. Flonase did not help  her nose. She has again noticed soreness lately around the right lower  lateral rib edge. She has had this feeling before with no real pattern.  It does not sound colicky, more related to twisting or bending. A CAT  scan of the chest, because of this pain in March 2007 was negative  without enlarged lymph nodes, with normal adrenal glands, normal chest  wall structures, and no infiltrates, or nodules.   MEDICATIONS:  1. Toprol 25 mg.  2. Aspirin 81 mg.  3. Atrovent 2 puffs daily p.r.n.  4. Actos 15 mg.  5. Qvar 40 mg 2 puffs b.i.d.  6. Diovan.  7. Prilosec.  8. Oxygen 2 liters, mostly at night.  9. P.r.n. of Meclizine, Tylenol, ibuprofen, and Promethazine with      codeine cough syrup.   ALLERGIES:  No medication allergy.   OBJECTIVE:  Weight 205 pounds, blood pressure 122/64, pulse regular 77,  room air saturation 95%. She is talkative and  in no distress. There is  no neck vein distension, cyanosis, or edema. I do not find adenopathy.  Lung fields are clear bilaterally. Heart sounds are regular without  murmur. I cannot feel a liver edge.   IMPRESSION:  Chronic obstructive pulmonary disease, musculoskeletal  pain, rhinitis.   PLAN:  1. Advanced services is going to evaluate for a light, portable oxygen      system and as part of that, we will need to evaluate exercise      oxygen saturation to see if she even needs it.  2. Continue Fluticasone which is refilled.  3. Recommend nasal saline gel.  4. Chest x-ray.  5. Schedule return 4 months, earlier p.r.n.     Clinton D. Annamaria Boots, MD, [facility], [place]  Electronically Signed    CDY/MedQ  DD: 05/27/2006  DT: 05/27/2006  Job #: [id number]   cc:   [name], M.D.  Lindaann Slough, M.D.  Octavia Heir, MD

## 2010-09-23 NOTE — Cardiovascular Report (Signed)
[place]  Patient:    [name], [name] Visit Number: [id number] MRN: [id number]          Service Type: CAT Location: [place] 01 Attending Physician:  Octavia Heir Dictated by:   Octavia Heir, M.D. Proc. Date: 03/21/01 Admit Date:  03/21/2001   CC:         [name], M.D.   Cardiac Catheterization  PROCEDURES: 1. Left heart catheterization. 2. Coronary angiography. 3. Left ventriculogram. 4. Abdominal aortogram.  COMPLICATIONS: None.  INDICATIONS: The patient is a 75 year old female with a history of insulin-dependent diabetes mellitus, hypertension, hyperlipidemia, history of COPD, who has had ongoing chest pain for the last two years. She did have a Cardiolite scan in October of 2000 revealing no significant ischemia with normal EF. Because of her ongoing symptoms and her multiple risk factors, we have proceeded with cardiac catheterization to define her coronary anatomy.  DESCRIPTION OF PROCEDURE: After given informed written consent, the patient was brought to the cardiac catheterization lab where her right and left groins were shaved, prepped, and draped in the usual sterile fashion. ECG monitoring was established. Using modified Seldinger technique, a #6 French arterial sheath was inserted in the right femoral artery. A 6 French diagnostic catheter was then used to perform diagnostic angiography. This reveals a large left main with no significant disease.  The LAD is a medium sized vessel which coursed to the apex and gave rise to one small diagonal branch. The LAD is noted to diffusely calcified in its proximal and midportion with up to 70% lesion involving the first septal perforator. The first diagonal is a small vessel with course irregularities.  The left circumflex is a large vessel which is dominant and gives rise to the PDA as well as two obtuse marginal branches. AV groove circumflex is noted to have  sequential 60% lesions in its distal segment with calcification.  The first OM is a large vessel which bifurcates distally and a 30% ostial lesion. The second OM is a large vessel which bifurcates distally and has no significant disease, the PDA and large vessel with no significant disease.  The right coronary artery is a small vessel which is calcified and nondominant.  LEFT VENTRICULOGRAM: The left ventriculogram reveals preserved EF at 60%.  ABDOMINAL AORTOGRAM: Abdominal aortogram reveals no evidence of renal artery stenosis.  HEMODYNAMICS: Systemic arterial pressure 169/88, LV systemic pressure 166/11, LVEDP of 16.  CONCLUSIONS: 1. Significant one-vessel coronary artery disease. 2. Normal left ventricular systolic function. 3. No evidence of renal artery stenosis. 4. Systemic hypertension. Dictated by:   Octavia Heir, M.D. Attending Physician:  Octavia Heir DD:  03/21/01 TD:  03/21/01 Job: [id number]

## 2010-09-23 NOTE — Assessment & Plan Note (Signed)
[place]                               PULMONARY OFFICE NOTE   NAME:[name], [name]                     MRN:          [id number]  DATE:01/30/2006                            DOB:          03-29-1936    PROBLEMS:  1. Asthmatic bronchitis.  2. Allergic rhinitis.  3. Vasomotor rhinitis.  4. Esophageal reflux.  5. Diverticulosis/irritable bowel.  6. Diabetes.   HISTORY:  She complains of increased cough with scant yellow sputum and  generalized fatigue over the last few days noticed during activities of  daily living and while shopping. She says a stress test was OK with Dr.  Tami Ribas a few months ago.  Occasional sharp twinges move around the left  chest, sound benign.   MEDICATIONS:  1. Toprol 25 mg.  2. Atacand 40 mg x1/2.  3. Nexium 40 mg.  4. Aspirin 81 mg.  5. Insulin.  6. Atrovent two puffs q.i.d. p.r.n.  7. Actos 15 mg.  8. Flonase.  9. Occasional Meclizine.   ALLERGIES:  NO MEDICATION ALLERGY.   OBJECTIVE:  Weight 205 pounds, BP 122/66, pulse regular 84, room air  saturation at rest 91%.  She is quite talkative and in no evident distress  whatsoever, overweight.  HEART:  Sounds regular with rare extra beat, but no murmur or gallop heard.  There was no neck vein distention or edema.  LUNGS:  Sounded clear to P&A.  HEENT:  There was moderate nasal congestion without visible mucous or post  nasal drip.   IMPRESSION:  Exacerbation of rhinitis.  She may be fighting a viral syndrome  with exacerbation of asthma and COPD.  We discussed options, mainly  supportive care.   PLAN:  1. Try Claritin for the post nasal drip without sedation.  2. Try Qvar 40, two puffs b.i.d. with instructions.  3. Keep scheduled appointment, earlier p.r.n.       Clinton D. Annamaria Boots, MD, FCCP, FACP      CDY/MedQ  DD:  01/31/2006  DT:  02/02/2006  Job #:  [id number]   cc:   [name], M.D.  Lindaann Slough, M.D.  Octavia Heir, MD

## 2010-09-23 NOTE — Op Note (Signed)
[name], [name]                        ACCOUNT NO.:  [id number]   MEDICAL RECORD NO.:  [id number]                   PATIENT TYPE:  AMB   LOCATION:  [place]                                  FACILITY:  [facility]   PHYSICIAN:  Youlanda Mighty. Luisa Dago., M.D.          DATE OF BIRTH:  Oct 19, 1935   DATE OF PROCEDURE:  02/19/2003  DATE OF DISCHARGE:                                 OPERATIVE REPORT   PREOPERATIVE DIAGNOSES:  Chronic severe entrapment neuropathy of median  nerve, right carpal tunnel.   POSTOPERATIVE DIAGNOSES:  Chronic severe entrapment neuropathy of median  nerve, right carpal tunnel.   OPERATION PERFORMED:  Release of right transverse carpal ligament.   SURGEON:  Youlanda Mighty. Sypher, M.D.   ASSISTANT:  Julian Reil, P.A.   ANESTHESIA:  IV regional.   SUPERVISING ANESTHESIOLOGIST:  Jessy Oto. Albertina Parr, M.D.   INDICATIONS FOR PROCEDURE:  [name] is a 75 year old woman referred  by Hortencia Pilar, M.D. for evaluation and management of a numb and  painful right hand.  Clinical examination revealed signs of severe carpal  tunnel syndrome.  Electrodiagnostic studies completed by Dr. Brien Few  confirmed severe median neuropathy.  Due to failure to respond to  nonoperative measures, she is brought to the operating room at this time for  release of her right transverse carpal ligament.   DESCRIPTION OF PROCEDURE:  [name] was brought to the operating room  and placed in supine position upon the operating table.  Following placement  of an IV regional block, the right arm was prepped with Betadine soap and  solution and sterilely draped.  Following exsanguination of the limb with an  Esmarch bandage, an arterial tourniquet on the proximal brachium was  inflated to 362mHg and IV regional anesthesia accomplished in the usual  manner.  After 12 minutes, anesthesia was complete in the hand.  The  procedure commenced with a short incision in line with the ring  finger in  the palm.  Subcutaneous tissues are carefully divided revealing the palmar  fascia.  This was split longitudinally to reveal the common sensory branch  of the median nerve.  These were followed back to the transverse carpal  ligament which was carefully isolated from the median nerve.  The ligament  was released on its ulnar border extending to the distal forearm.  This  widely opened the carpal canal.  No masses or other predicaments were noted.  Bleeding points along the margin of the released ligament were  electrocauterized with bipolar current.   The wound was then inspected for masses and no other predicaments were  noted.  The wound was then repaired with intradermal  3-0 Prolene suture.  A compressive dressing was applied with a volar plaster  splint maintaining the wrist in five  degrees dorsiflexion.  For aftercare, Ms. HBabicwas given a prescription for Darvocet-N 100 one by  mouth every six hours as needed  for pain, total of 20 tablets without  refill.                                                Youlanda Mighty Luisa Dago., M.D.    RVS/MEDQ  D:  02/19/2003  T:  02/19/2003  Job:  [id number]

## 2010-09-23 NOTE — Assessment & Plan Note (Signed)
[place]                               PULMONARY OFFICE NOTE   NAME:[name], [name]                     MRN:          [id number]  DATE:11/22/2005                            DOB:          02/17/1936    1.  Asthmatic bronchitis.  2.  Allergic rhinitis.  3.  Diverticulosis/irritable bowel.  4.  Diabetes.  5.  Esophageal reflux.  6.  Vasomotor rhinitis.   HISTORY:  Dr. [name] had treated reflux-related discomfort.  She went back  on Spiriva which relieved chest tightness.  She saw Dr. Redmond Baseman about  complaints of vertigo.  She was unable to participate with [facility].  Nasal congestion wakes her then she develops watery rhinorrhea.   MEDICATIONS:  1.  Toprol 25 mg.  2.  Atacand 40 mg x1/2.  3.  Nexium 40 mg.  4.  Aspirin 81 mg.  5.  Insulin.  6.  Spiriva.  7.  Atrovent two puffs q.i.d.  8.  Actos 15 mg.  9.  Meclizine.  10. Tylenol.  11. Ibuprofen.  12. Phenergan with codeine cough syrup.  13. Xanax.  14. Amitriptyline.   OBJECTIVE:  VITAL SIGNS:  Weight 204 pounds, blood pressure 148/72, pulse  regular 81, room air saturation 94%.  GENERAL:  She is quite talkative, seeming in no distress.  HEENT:  Clear.  CHEST:  Clear and unlabored with no cough or wheeze.  HEART:  Sounds are regular without murmur.  EXTREMITIES:  There is no peripheral edema.   IMPRESSION:  Vasomotor rhinitis.  Asthma with bronchitis appears stable.   PLAN:  Flonase one spray each nostril daily.  Schedule return six months,  earlier p.r.n.                                   Clinton D. Annamaria Boots, MD, [facility], FACP   CDY/MedQ  DD:  12/07/2005  DT:  12/07/2005  Job #:  [id number]   cc:   [name], MD  Lindaann Slough, MD  Octavia Heir, MD

## 2010-09-23 NOTE — Op Note (Signed)
NAMEEMILYANNE, MCGOUGH              ACCOUNT NO.:  [id number]   MEDICAL RECORD NO.:  [id number]          PATIENT TYPE:  AMB   LOCATION:  [facility]                          FACILITY:  [facility]   PHYSICIAN:  Robert L. Katy Fitch, M.D.  DATE OF BIRTH:  May 10, 1935   DATE OF PROCEDURE:  07/11/2006  DATE OF DISCHARGE:                               OPERATIVE REPORT   INDICATIONS AND JUSTIFICATION FOR PROCEDURE:  This lady has been  followed in my office for the past 7 or 8 years and has had moderate  dermatochalasis.  She came in most recently on February 29 and has been  complaining about her dermatochalasis.  She actually was also seen in  August 2007 with cataracts and in February 2008, she came in  specifically regarding upper eyelid surgery.  She could feel the weight  from the skin of the eyelids.  It causes fatigue and it blocks her upper  visual fields.  Visual field testing with the skin taped compared to  when it was not taped showed a significant reduction in the vision when  skin was relaxed and photographs were taken to document the position of  the skin with respect to stage of pupil.  It is my opinion that this is  causing significant visual symptoms and the procedure is not a cosmetic  one.  She has chosen to have upper optical blepharoplasty.  She has had  previous cataract surgery.  Otherwise, examination shows that the vision  is 20/25 to 20/30 in each eye and the pressure is to 17 in the right and  18 in the left.  Visual field testing shows a loss in the superior  field.  She is a diabetic.  She does not have diabetic retinopathy.  The  pupils motility, conjunctivae, cornea and anterior chamber are  unremarkable and she has had some cupping compatible with glaucoma.  The  skin of her upper eyelids does cover the lashes, comes close to each  pupil.  She plans to have upper eyelid blepharoplasty in order to open  up her vision.  Medically she should be stable.   JUSTIFICATION FOR  PERFORMING THE PROCEDURE IN AN OUTPATIENT SETTING:  Routine.   JUSTIFICATION FOR OVERNIGHT STAY:  None.   PREOPERATIVE DIAGNOSIS:  Severe dermatochalasis with visual impairment.   POSTOPERATIVE DIAGNOSIS:  Severe dermatochalasis with visual impairment.   OPERATION PERFORMED:  Upper eyelid blepharoplasty.   SURGEON:  Robert L. Katy Fitch, M.D.   ANESTHESIA:  Xylocaine 1% with epinephrine.   PROCEDURE:  The patient was transferred to the operating room and was  prepped and draped in the routine fashion.  __________ .  Bleeding was  controlled with compression.  Each wound was closed with running 6-0  nylon suture and postop dressings were applied.  The patient left the  operating room and is doing nicely.   FOLLOWUP CARE:  The patient is keep her head elevated today.  She is to  use cold compresses today and warm compresses starting after that.  She  is to come back in 5 days to have the sutures removed.  ______________________________  Jaymes Graff Katy Fitch, M.D.     RLG/MEDQ  D:  07/12/2006  T:  07/12/2006  Job:  [id number]

## 2010-09-28 ENCOUNTER — Ambulatory Visit (INDEPENDENT_AMBULATORY_CARE_PROVIDER_SITE_OTHER): Payer: Medicare Other | Admitting: Internal Medicine

## 2010-09-28 ENCOUNTER — Encounter: Payer: Self-pay | Admitting: Internal Medicine

## 2010-09-28 ENCOUNTER — Ambulatory Visit (INDEPENDENT_AMBULATORY_CARE_PROVIDER_SITE_OTHER)
Admission: RE | Admit: 2010-09-28 | Discharge: 2010-09-28 | Disposition: A | Discharge status: Home / Self Care | Payer: Medicare Other | Source: Ambulatory Visit | Attending: Internal Medicine | Admitting: Internal Medicine

## 2010-09-28 VITALS — BP 118/78 | HR 76 | Ht 64.0 in | Wt 201.2 lb

## 2010-09-28 DIAGNOSIS — J449 Chronic obstructive pulmonary disease, unspecified: Secondary | ICD-10-CM

## 2010-09-28 DIAGNOSIS — K219 Gastro-esophageal reflux disease without esophagitis: Secondary | ICD-10-CM

## 2010-09-28 MED ORDER — METHYLPREDNISOLONE ACETATE 80 MG/ML IJ SUSP
80.0000 mg | Freq: Once | INTRAMUSCULAR | Status: AC
  Administered 2010-09-28: 80 mg via INTRAMUSCULAR

## 2010-09-28 NOTE — Patient Instructions (Signed)
Try otc meclizine for dizzy/ light headed Use your oxygen in the daytime as needed  Depo 80  Order - CXR for COPD

## 2010-09-28 NOTE — Progress Notes (Signed)
  Subjective:    Patient ID: [name], female    DOB: 12-Dec-1935, 75 y.o.   MRN: [id number]  HPI SATURATION QUALIFICATIONS: Patient Saturations on Room Air while Ambulating = 84% Patient Saturations on 2.5 Liters of oxygen while Ambulating = 92%Caryle Helgeson,CMA  09/28/10-  75 yoF former smoker with COPD, complicated by chronic rhinitis, GERD. Last here 04/29/10- note reviewed. Acute visit.-- Woke with soreness across under her breasts. Losing voice. Coughing spells, with much liquid coming up and from nose. Prior to that hadn't been coughing. C/o weak, dizzy, light headed- relieved by her oxygen. . Moving into a smaller apartment. Denies fever. Easy DOE short walks room to room.   Review of Systems Constitutional:   No weight loss, night sweats,  Fevers, chills, fatigue, lassitude. HEENT:   No headaches,  Difficulty swallowing,  Tooth/dental problems,  Sore throat,                No sneezing, itching, ear ache, nasal congestion, post nasal drip,   CV:  No chest pain,  Orthopnea, PND, swelling in lower extremities, anasarca, dizziness, palpitations  GI  No heartburn, indigestion, abdominal pain, nausea, vomiting, diarrhea, change in bowel habits, loss of appetite  Resp: No shortness of breath with exertion or at rest. No coughing up of blood.  No change in color of mucus.  No wheezing. Skin: no rash or lesions.  GU: no dysuria, change in color of urine, no urgency or frequency.  No flank pain.  MS:  No joint pain or swelling.  No decreased range of motion.  No back pain.  Psych:  No change in mood or affect. No depression or anxiety.  No memory loss.      Objective:   Physical Exam General- Alert, Oriented, Affect-appropriate, Distress- none acute   Obese, talkative  Skin- rash-none, lesions- none, excoriation- none  Lymphadenopathy- none  Head- atraumatic  Eyes- Gross vision intact, PERRLA, conjunctivae clear, secretions  Ears- Hearing, canals, Tm - normal  Nose-  Clear, No-  Septal dev, mucus, polyps, erosion, perforation   Throat- Mallampati II , mucosa clear , drainage- none, tonsils- atrophic  Neck- flexible , trachea midline, no stridor , thyroid nl, carotid no bruit  Chest - symmetrical excursion , unlabored     Heart/CV- RRR , no murmur , no gallop  , no rub, nl s1 s2                     - JVD- none , edema- none, stasis changes- none, varices- none     Lung- clear to P&A, wheeze- none, cough- none , dullness-none, rub- none.   Minor rhonchi right lateral base     Chest wall-   Abd- tender-no, distended-no, bowel sounds-present, HSM- no  Br/ Gen/ Rectal- Not done, not indicated  Extrem- cyanosis- none, clubbing, none, atrophy- none, strength- nl  Neuro- grossly intact to observation         Assessment & Plan:

## 2010-09-28 NOTE — Assessment & Plan Note (Addendum)
Waking acutely ill from sleep strongly suggests that she refluxed during the night to trigger this spell.. We will give depo, encourage attention to reflux control, continue use of oxygen Try occasional meclizine for dizzy

## 2010-09-29 ENCOUNTER — Telehealth: Payer: Self-pay | Admitting: Internal Medicine

## 2010-09-29 NOTE — Telephone Encounter (Signed)
ATC x 2 line was busy

## 2010-09-29 NOTE — Telephone Encounter (Signed)
Pt aware cxr showed changes seen with COPD or asthma but no acute or active process per CDY Pt wants to know if there is anything else she can do regarding the fatigue. She says this started when her breathing got bad. Pls advise of any recs.  Allergies  Allergen Reactions  . Codeine     REACTION: gi upset  . Tramadol

## 2010-09-29 NOTE — Telephone Encounter (Signed)
Pt phoned stated she was still waiting on results of her chest xray she can be reached [phone].[place]

## 2010-09-30 NOTE — Telephone Encounter (Signed)
LMTCB

## 2010-09-30 NOTE — Telephone Encounter (Signed)
Sorry- other than napping when needed, and maybe Nodoz/ caffeine tab, I don't have a suggestion now.

## 2010-09-30 NOTE — Telephone Encounter (Signed)
Spoke with pt and notified of recs per CDY. Pt verbalized understanding.

## 2010-10-03 NOTE — Assessment & Plan Note (Signed)
Moderate COPD mainly associated with exertional dyspnea.

## 2010-10-03 NOTE — Assessment & Plan Note (Signed)
Reflux precautions reviewed.

## 2010-10-28 ENCOUNTER — Encounter: Payer: Self-pay | Admitting: Internal Medicine

## 2010-10-31 ENCOUNTER — Ambulatory Visit (INDEPENDENT_AMBULATORY_CARE_PROVIDER_SITE_OTHER): Payer: Medicare Other | Admitting: Internal Medicine

## 2010-10-31 ENCOUNTER — Encounter: Payer: Self-pay | Admitting: Internal Medicine

## 2010-10-31 VITALS — BP 118/62 | HR 72 | Ht 64.0 in | Wt 200.6 lb

## 2010-10-31 DIAGNOSIS — J4489 Other specified chronic obstructive pulmonary disease: Secondary | ICD-10-CM

## 2010-10-31 DIAGNOSIS — R490 Dysphonia: Secondary | ICD-10-CM

## 2010-10-31 DIAGNOSIS — J449 Chronic obstructive pulmonary disease, unspecified: Secondary | ICD-10-CM

## 2010-10-31 MED ORDER — IPRATROPIUM BROMIDE HFA 17 MCG/ACT IN AERS: 2.0000 | INHALATION_SPRAY | Freq: Four times a day (QID) | RESPIRATORY_TRACT | Status: DC

## 2010-10-31 NOTE — Assessment & Plan Note (Signed)
We discussed laryngeal problems associated with hoarseness and will refer to ENT anticipating laryngoscopy.

## 2010-10-31 NOTE — Progress Notes (Signed)
Subjective:    Patient ID: [name], female    DOB: Mar 17, 1936, 75 y.o.   MRN: [id number]  HPI  Subjective:    Patient ID: [name], female    DOB: 01-29-1936, 75 y.o.   MRN: [id number]  HPI SATURATION QUALIFICATIONS: Patient Saturations on Room Air while Ambulating = 84% Patient Saturations on 2.5 Liters of oxygen while Ambulating = 92%[name],CMA  09/28/10-  75 yoF former smoker with COPD, complicated by chronic rhinitis, GERD. Last here 04/29/10- note reviewed. Acute visit.-- Woke with soreness across under her breasts. Losing voice. Coughing spells, with much liquid coming up and from nose. Prior to that hadn't been coughing. C/o weak, dizzy, light headed- relieved by her oxygen. . Moving into a smaller apartment. Denies fever. Easy DOE short walks room to room.   10/31/10- 54 yoF former smoker with COPD, complicated by chronic rhinitis, GERD. She denies major change since last here. Has to stay in to avoid heat and on her oxygen much of the time in the house. Coughs scant clear or lemon yellow, with some hoarseness which comes and goes. She describes hoarseness as worse with phone conversations, variable but rarely if ever clear. She had seen Dr [name] years ago for epistaxis. Easy choke with eating. Discussed aspiration precautions, chin tuck.   Review of Systems Constitutional:   No weight loss, night sweats,  Fevers, chills, fatigue, lassitude. HEENT:   No headaches,  Difficulty swallowing,  Tooth/dental problems,  Sore throat,                No sneezing, itching, ear ache, nasal congestion, post nasal drip,   CV:  No chest pain,  Orthopnea, PND, swelling in lower extremities, anasarca, dizziness, palpitations  GI  No heartburn, indigestion, abdominal pain, nausea, vomiting, diarrhea, change in bowel habits, loss of appetite  Resp: No acute shortness of breath with exertion or at rest. No coughing up of blood.  No change in color of mucus.  No  wheezing.  Skin: no rash or lesions.  GU: no dysuria, change in color of urine, no urgency or frequency.  No flank pain.  MS:  No joint pain or swelling.  No decreased range of motion.  No back pain.  Psych:  No change in mood or affect. No depression or anxiety.  No memory loss.      Objective:   Physical Exam General- Alert, Oriented, Affect-appropriate, Distress- none acute   Obese, talkative  Skin- rash-none, lesions- none, excoriation- none  Lymphadenopathy- none  Head- atraumatic  Eyes- Gross vision intact, PERRLA, conjunctivae clear, secretions  Ears- Hearing, canals, Tm - normal  Nose- Clear, No-  Septal dev, mucus, polyps, erosion, perforation   Throat- Mallampati II , mucosa clear , drainage- none, tonsils- atrophic  Neck- flexible , trachea midline, no stridor , thyroid nl, carotid no bruit  Chest - symmetrical excursion , unlabored     Heart/CV- RRR , no murmur , no gallop  , no rub, nl s1 s2                     - JVD- none , edema- none, stasis changes- none, varices- none     Lung- clear to P&A, distant, wheeze- none, cough- none , dullness-none, rub- none.   Minor crackle right lateral base     Chest wall-   Abd- tender-no, distended-no, bowel sounds-present, HSM- no  Br/ Gen/ Rectal- Not done, not indicated  Extrem- cyanosis- none, clubbing, none,  atrophy- none, strength- nl  Neuro- grossly intact to observation         Assessment & Plan:     Review of Systems     Objective:   Physical Exam        Assessment & Plan:

## 2010-10-31 NOTE — Patient Instructions (Addendum)
Referral to Dr Rema Fendt, ENT, for  Hoarseness  Script refill Atrovent / ipratropium inhaler

## 2010-10-31 NOTE — Assessment & Plan Note (Signed)
We discussed concern that mild laryngeal incompetence with hoarseeness may predispose to aspiration.

## 2010-10-31 NOTE — Progress Notes (Signed)
  Subjective:    Patient ID: [name], female    DOB: Dec 08, 1935, 75 y.o.   MRN: [id number]  HPI SATURATION QUALIFICATIONS:  Patient Saturations on Room Air while Ambulating = 87%  Patient Saturations on 3 Liters of oxygen while Ambulating = 91% Blair Hailey      Review of Systems     Objective:   Physical Exam        Assessment & Plan:

## 2010-11-01 ENCOUNTER — Telehealth: Payer: Self-pay | Admitting: Internal Medicine

## 2010-11-01 NOTE — Telephone Encounter (Signed)
ATC pt x 2 line was busy, [facility]

## 2010-11-01 NOTE — Telephone Encounter (Signed)
Spoke with pt. She states needs rx for atrovent to be changed to # 2 inhalers per month. She states that she takes this 2 puffs qid and some days has to use a few more puffs if she is having a bad day with her breathing. She states that she has always got 2 inhalers and wants to know why only got one this time. Please advise, thanks!

## 2010-11-01 NOTE — Telephone Encounter (Signed)
Spoke with pt. She states that her pharmacist advised her # 2 inhalers not needed, as CDY gave her prn refills. Nothing further needed.

## 2010-11-01 NOTE — Telephone Encounter (Signed)
Ok to change her Atrovent HFA inhaler to # 2,  2 puffs every 4 hours, prn, refill prn

## 2010-11-02 ENCOUNTER — Other Ambulatory Visit: Payer: Self-pay | Admitting: *Deleted

## 2010-11-02 MED ORDER — FLUTICASONE PROPIONATE 50 MCG/ACT NA SUSP: 1.0000 | Freq: Every day | NASAL | Status: DC

## 2010-12-05 ENCOUNTER — Telehealth: Payer: Self-pay | Admitting: Internal Medicine

## 2010-12-05 MED ORDER — IPRATROPIUM BROMIDE 0.02 % IN SOLN: 500.0000 ug | Freq: Four times a day (QID) | RESPIRATORY_TRACT | Status: DC

## 2010-12-05 NOTE — Telephone Encounter (Signed)
Called spoke with patient who requests CDY recs re increased use of her atrovent.  Pt reports that she takes her atrovent 2 puffs when she wakes up, 1 puff aprrox 10am, 2 puffs at lunch, 1 puff mid-afternoon prn, 2 puffs at dinner and 2 puffs at bedtime.  Pt aware she is supposed to be taking only 2 puffs qid but reports that she used to get "2 inhalers every month."  Pt does reports worsening DOE since last appt but denies cough, wheezing, mucus production.  Pt reuqesting to try another medication if CDY feels this is appropriate.  Please advise, thanks!  Allergies  Allergen Reactions  . Codeine     REACTION: gi upset  . Tramadol

## 2010-12-05 NOTE — Telephone Encounter (Signed)
Spoke with patient-states that she is willing to try the ipratropium nebulizer medication-would like to get a month supply from health care company and try at first. CY okay with giving RX for #120 qid prn. RX printed, signed and given to [facility] to start.

## 2010-12-05 NOTE — Telephone Encounter (Signed)
Does she remember using Xopenex HFA inhaler? i don't think she tolerates albuterol stimulation, which is why we use Atrovent. Cost prevented use of Spiriva.  We might try home nebulizer with ipratropium- chemically like Atrovent and Spiriva, but Medicare would help with cost through home care company. What does she think about any of these options/

## 2010-12-06 ENCOUNTER — Telehealth: Payer: Self-pay | Admitting: Internal Medicine

## 2010-12-06 MED ORDER — AZITHROMYCIN 250 MG PO TABS: ORAL_TABLET | ORAL | Status: AC

## 2010-12-06 NOTE — Telephone Encounter (Signed)
Per CY okay to give Zpak #1 take as directed no refills.

## 2010-12-06 NOTE — Telephone Encounter (Signed)
Spoke with pt and notified that rx for zpack was sent to pharm. Pt verbalized understanding.

## 2010-12-06 NOTE — Telephone Encounter (Signed)
Called spoke with patient who c/o wheezing, increased SOB, prod cough with green mucus, and increased fatigue onset this morning.  Requesting a zpak.  Dr Annamaria Boots is it okay to send this for patient?  Thanks!  [facility] [address]  Allergies  Allergen Reactions  . Codeine     REACTION: gi upset  . Tramadol

## 2011-01-13 ENCOUNTER — Telehealth: Payer: Self-pay | Admitting: Internal Medicine

## 2011-01-13 MED ORDER — AZITHROMYCIN 250 MG PO TABS: ORAL_TABLET | ORAL | Status: AC

## 2011-01-13 NOTE — Telephone Encounter (Signed)
Per CDY, Atrovent inhaler might dry her mouth but will not cause yeast or raise her blood sugar.  Offer Zpak # 1 no ref.  Thanks!

## 2011-01-13 NOTE — Telephone Encounter (Signed)
I spoke with pt and she states she she has started taking Atrovent she has been having a sore throat and hoarseness. Pt also states since taking the atrovent her blood sugars have been running high. Pt states the medication has helped her some. Pt is taking the atrovent up to 3 times a day now b/c of the hoarseness she is experiencing. Pt advised me that she is washing her mouth out of using the inhaler each time.  Pt also c/o cough with green phlem, wheezing, chest tightness. Pt states she believes she has bronchitis. Pt requesting recs from Dr. Annamaria Boots. Please advise Dr. Annamaria Boots. Thanks  Allergies  Allergen Reactions  . Codeine     REACTION: gi upset  . Tramadol    Charma Igo, [place]

## 2011-01-13 NOTE — Telephone Encounter (Signed)
I spoke with pt and made her aware of cdy recs. Pt aware zpak was sent to pharmacy and nothing further was needed

## 2011-01-25 ENCOUNTER — Telehealth: Payer: Self-pay | Admitting: Internal Medicine

## 2011-01-25 NOTE — Telephone Encounter (Signed)
Called, spoke with pt.  States she is currently getting 2 atrovent hfa inhalers to last 60 days when she picks up rx.  However, she would like to only pick up 1 inhaler now.  She is requesting I send in rx for this but rx was sent on 10/2010.  I called [facility] on [address] and spoke with Mindy who states they do have the rx from June.  She states they will not need a new rx for pt to pick up 1 inhaler vs 2 but will just need pt to inform them of this when she is trying to refill this medication.    I called pt back and explained to her what I was told by [facility].  She verbalized understanding of this and will call back if anything further is needed.

## 2011-02-01 ENCOUNTER — Telehealth: Payer: Self-pay | Admitting: Internal Medicine

## 2011-02-01 MED ORDER — PROMETHAZINE-CODEINE 6.25-10 MG/5ML PO SYRP: ORAL_SOLUTION | ORAL | Status: DC

## 2011-02-01 NOTE — Telephone Encounter (Signed)
Last OV w/ CDY  10/31/2010. Last fill date not provided by the pharmacy. Last known prescription sent on 10/11/2009 to [facility] (in EMR). Requesting 100 mL's, 1 tsp q6h prn cough. Pls advise. Allergies  Allergen Reactions  . Codeine     REACTION: gi upset  . Tramadol

## 2011-02-01 NOTE — Telephone Encounter (Signed)
PER CDY okay to refill  I have called this rx into pharmacy

## 2011-02-21 ENCOUNTER — Telehealth: Payer: Self-pay | Admitting: Internal Medicine

## 2011-02-21 NOTE — Telephone Encounter (Signed)
Last colon 2003, hx of IBS, please start Librax 1 po tid  And Align 1 po qd, she will likely need a colonoscopy.

## 2011-02-21 NOTE — Telephone Encounter (Signed)
Patient calling to report ongoing diarrhea and for the last few months, stomach pain. States she has left upper abdominal pain. Feels like something shooting from upper left abdomen to left breast. Describes it as a "splashing" sensation that comes and goes. Also, reports one episode of belching up food. Wants to see Dr. [name] not an extender. Scheduled on 03/05/12 at 9:30 AM.

## 2011-02-22 MED ORDER — CILIDINIUM-CHLORDIAZEPOXIDE 2.5-5 MG PO CAPS: ORAL_CAPSULE | ORAL | Status: DC

## 2011-02-22 NOTE — Telephone Encounter (Signed)
Spoke with patient and gave her Dr. Nichola Sizer recommendations.

## 2011-02-22 NOTE — Telephone Encounter (Signed)
Rx sent to pharmacy. Line busy. Will try again later.

## 2011-02-23 ENCOUNTER — Telehealth: Payer: Self-pay | Admitting: Internal Medicine

## 2011-02-23 NOTE — Telephone Encounter (Signed)
Patient calling to report her insurance would not pay for Librax. She got a few of them just to try. She took one yesterday and it made her so sleepy. She slept all afternoon. Her blood sugar was high when she got up also. States she cannot take this medication. She is taking the Align. Wants to know if there is anything else she can try. Please, advise.

## 2011-02-23 NOTE — Telephone Encounter (Signed)
Spoke with patient. She has Bentyl from Dr. Dwyane Dee. She will try it BID and up to QID prn IBS flare up. She does not need a new rx at this time.

## 2011-02-23 NOTE — Telephone Encounter (Signed)
Yes, she can take Bentyl 10 mg po bid, it is not as strong, #60 1 refill, may incerase to qid prn IBS flare up

## 2011-02-27 ENCOUNTER — Encounter: Payer: Self-pay | Admitting: Internal Medicine

## 2011-02-27 ENCOUNTER — Ambulatory Visit (INDEPENDENT_AMBULATORY_CARE_PROVIDER_SITE_OTHER): Payer: Medicare Other | Admitting: Internal Medicine

## 2011-02-27 ENCOUNTER — Ambulatory Visit (INDEPENDENT_AMBULATORY_CARE_PROVIDER_SITE_OTHER)
Admission: RE | Admit: 2011-02-27 | Discharge: 2011-02-27 | Disposition: A | Discharge status: Home / Self Care | Payer: Medicare Other | Source: Ambulatory Visit | Attending: Internal Medicine | Admitting: Internal Medicine

## 2011-02-27 VITALS — BP 139/68 | HR 95 | Ht 64.0 in | Wt 203.6 lb

## 2011-02-27 DIAGNOSIS — J449 Chronic obstructive pulmonary disease, unspecified: Secondary | ICD-10-CM

## 2011-02-27 DIAGNOSIS — J40 Bronchitis, not specified as acute or chronic: Secondary | ICD-10-CM

## 2011-02-27 DIAGNOSIS — K219 Gastro-esophageal reflux disease without esophagitis: Secondary | ICD-10-CM

## 2011-02-27 NOTE — Patient Instructions (Signed)
Order- CXR- bronchitis

## 2011-02-27 NOTE — Progress Notes (Signed)
Patient ID: [name], female    DOB: 1935-11-21, 75 y.o.   MRN: [id number]  HPI SATURATION QUALIFICATIONS: Patient Saturations on Room Air while Ambulating = 84% Patient Saturations on 2.5 Liters of oxygen while Ambulating = 92%[name],CMA  09/28/10-  75 yoF former smoker with COPD, complicated by chronic rhinitis, GERD. Last here 04/29/10- note reviewed. Acute visit.-- Woke with soreness across under her breasts. Losing voice. Coughing spells, with much liquid coming up and from nose. Prior to that hadn't been coughing. C/o weak, dizzy, light headed- relieved by her oxygen. . Moving into a smaller apartment. Denies fever. Easy DOE short walks room to room.   10/31/10- 89 yoF former smoker with COPD, complicated by chronic rhinitis, GERD. She denies major change since last here. Has to stay in to avoid heat and on her oxygen much of the time in the house. Coughs scant clear or lemon yellow, with some hoarseness which comes and goes. She describes hoarseness as worse with phone conversations, variable but rarely if ever clear. She had seen Dr [name] years ago for epistaxis. Easy choke with eating. Discussed aspiration precautions, chin tuck.   02/26/11- 18 yoF former smoker with COPD, complicated by chronic rhinitis, GERD. Has had flu vaccine. No coughing through the summer. We first cool days as she stepped outside she would begin to cough again. Dry hacking cough. Very aware of active reflux anytime she bends over, regurgitating recently swallowed food. Has also had some diarrhea. Complains of spasms intermittently left lower anterior chest and left upper quadrant. It may all be GI. She thinks it is part of her diabetic neuropathy. Has appointment soon with Dr.Brodie.   Review of Systems-see HPI Constitutional:   No-   weight loss, night sweats, fevers, chills, fatigue, lassitude. HEENT:   No-  headaches, difficulty swallowing, tooth/dental problems, sore throat,       No-   sneezing, itching, ear ache, nasal congestion, post nasal drip,  CV:  No-   chest pain, orthopnea, PND, swelling in lower extremities, anasarca, dizziness, palpitations Resp: No- acute  shortness of breath with exertion or at rest.              No-   productive cough,  + recent non-productive cough,  No- coughing up of blood.              No-   change in color of mucus.  No- wheezing.   Skin: No-   rash or lesions. GI:  No-   heartburn, indigestion,   +abdominal pain, nausea, vomiting,   +diarrhea,                 change in bowel habits, loss of appetite GU: No-   dysuria, change in color of urine, no urgency or frequency.  No- flank pain. MS:  No-   joint pain or swelling.  No- decreased range of motion.  No- back pain. Neuro-     nothing unusual Psych:  No- change in mood or affect. No depression or anxiety.  No memory loss.      Objective:   Physical Exam General- Alert, Oriented, Affect-appropriate, Distress- none acute. Talkative. O2 sat 93% on O2 2.5 L Skin- rash-none, lesions- none, excoriation- none Lymphadenopathy- none Head- atraumatic            Eyes- Gross vision intact, PERRLA, conjunctivae clear secretions            Ears- Hearing, canals-normal  Nose- Clear, no-Septal dev, mucus, polyps, erosion, perforation             Throat- Mallampati II , mucosa clear , drainage- none, tonsils- atrophic Neck- flexible , trachea midline, no stridor , thyroid nl, carotid no bruit Chest - symmetrical excursion , unlabored           Heart/CV- RRR , no murmur , no gallop  , no rub, nl s1 s2                           - JVD- none , edema- none, stasis changes- none, varices- none           Lung- few crackles right base, wheeze- none, cough- none , dullness-none, rub- none           Chest wall-  Abd- tender-no, distended-no, bowel sounds-present, HSM- no Br/ Gen/ Rectal- Not done, not indicated Extrem- cyanosis- none, clubbing, none, atrophy- none, strength- nl Neuro- grossly  intact to observation

## 2011-02-28 NOTE — Assessment & Plan Note (Signed)
Wide-open reflux when she bends over. She does not feel it as heartburn. GI appointment to followup with Dr. [name] is pending

## 2011-02-28 NOTE — Assessment & Plan Note (Addendum)
I'm not sure whether her recent cough is a response to season change and cool air, or to the distinct increase in esophageal regurgitation that she is noticing, or both. Medications reviewed. Plan chest x-ray

## 2011-03-02 ENCOUNTER — Telehealth: Payer: Self-pay | Admitting: Internal Medicine

## 2011-03-02 MED ORDER — AZITHROMYCIN 250 MG PO TABS: ORAL_TABLET | ORAL | Status: DC

## 2011-03-02 NOTE — Telephone Encounter (Signed)
Called and spoke with pt. Pt states she was just seen by CY on Monday 10/22.  Pt states she believes she has 'bronchitis.'  C/o coughing up green sputum, sob, wheezing, and increased tightness in chest.  Denies f/c/s.  Requesting rx for z pak.  Pt states she doesn't need anything for cough.  Cy,please advise.  Thanks. Allergies  Allergen Reactions  . Librax Other (See Comments)    Sleepy,weak  . Codeine     REACTION: gi upset  . Tramadol

## 2011-03-02 NOTE — Telephone Encounter (Signed)
NOTE: [facility] USES [facility] [address] Mariann Laster

## 2011-03-02 NOTE — Telephone Encounter (Signed)
Called and spoke with pt and informed her of CY's recs and rx sent to pharmacy.  Also informed her of recent cxr results from 02/27/11

## 2011-03-02 NOTE — Telephone Encounter (Signed)
OK Z pk

## 2011-03-06 ENCOUNTER — Encounter: Payer: Self-pay | Admitting: Internal Medicine

## 2011-03-06 ENCOUNTER — Ambulatory Visit (INDEPENDENT_AMBULATORY_CARE_PROVIDER_SITE_OTHER): Payer: Medicare Other | Admitting: Internal Medicine

## 2011-03-06 VITALS — BP 132/68 | HR 76 | Ht 64.0 in | Wt 203.0 lb

## 2011-03-06 DIAGNOSIS — R197 Diarrhea, unspecified: Secondary | ICD-10-CM

## 2011-03-06 DIAGNOSIS — R1319 Other dysphagia: Secondary | ICD-10-CM

## 2011-03-06 DIAGNOSIS — K219 Gastro-esophageal reflux disease without esophagitis: Secondary | ICD-10-CM

## 2011-03-06 MED ORDER — CHOLESTYRAMINE 4 G PO PACK: PACK | ORAL | Status: DC

## 2011-03-06 MED ORDER — OMEPRAZOLE 40 MG PO CPDR: 40.0000 mg | DELAYED_RELEASE_CAPSULE | Freq: Two times a day (BID) | ORAL | Status: DC

## 2011-03-06 MED ORDER — PEG-KCL-NACL-NASULF-NA ASC-C 100 G PO SOLR: 1.0000 | Freq: Once | ORAL | Status: DC

## 2011-03-06 NOTE — Patient Instructions (Addendum)
You have been scheduled for an endoscopy and colonoscopy with Propofol Please follow the written instructions given to you at your visit today. Please pick up your prep at the pharmacy within the next 2-3 days. We have sent the following medications to your pharmacy for you to pick up at your convenience: Questran once daily Prilosec 40 mg twice daily CC: [name]

## 2011-03-06 NOTE — Progress Notes (Signed)
[name] 1935-08-18 MRN [id number]    History of Present Illness:  This is a 75 year old white female diabetic with chronic intermittent diarrhea. I have been seeing her since 26. Her diarrhea has been attributed to post cholecystectomy syndrome, irritable bowel syndrome and diabetic autonomic neuropathy. A colonoscopy in 2003 showed diverticulosis. She has a history of gastroesophageal reflux, esophageal stricture and hiatal hernia. She underwent an upper endoscopy and dilatation in December 2008. Patient has fatty liver documented on a liver biopsy in 1994 showing steatohepatitis. An abdominal ultrasound in December 2008 confirmed fatty liver. Her common bile duct was 3.8 mm. She is  complaining of left middle and upper quadrant abdominal pain, cramps and watery diarrhea with incontinence. She denies rectal bleeding. She complains of regurgitation of the food when she bends over. She has been on Prilosec 20 mg once a day or twice a day.   Past Medical History  Diagnosis Date  . Irritable bowel syndrome   . Other chronic nonalcoholic liver disease   . Esophageal reflux   . Allergic rhinitis, cause unspecified     Sinus CT Rec 12-23-2009  . 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 Sciatica   . Shingles 2010  . Steatohepatitis   . Diabetes mellitus   . Diverticulosis   . Hiatal hernia   . Esophageal stricture   . Diarrhea   . GERD (gastroesophageal reflux disease)    Past Surgical History  Procedure Date  . Cholecystectomy open 1978  . Liver biopsy 08-1992  . Parathyroid exploration   . Tonsillectomy and adenoidectomy   . Total abdominal hysterectomy   . Esophagogastroduodenoscopy 8299,37-16    H Hernia,es.stricture s/p dil 54F  . Colonoscopy 07-2001    mild diverticulosis  . Carpal tunnel release     right hand  . Cataract extraction     Bilateral    reports that she quit smoking about 22 years ago. Her smoking  use included Cigarettes. She has never used smokeless tobacco. She reports that she does not drink alcohol or use illicit drugs. family history includes Diabetes in an unspecified family member; Heart disease in her father; Liver cancer in her sister; Lung cancer in her mother and sister; and Stroke in her maternal grandfather. Allergies  Allergen Reactions  . Librax Other (See Comments)    Sleepy,weak  . Codeine     REACTION: gi upset  . Tramadol         Review of Systems: Recurrent solid food dysphagia. Reflux of acid and food. Diarrhea. Denies constipation. Positive for abdominal pain  The remainder of the 10 point ROS is negative except as outlined in H&P   Physical Exam: General appearance  Well developed, in no distress., Overweight Eyes- non icteric. HEENT nontraumatic, normocephalic. Mouth no lesions, tongue papillated, no cheilosis. Neck supple without adenopathy, thyroid not enlarged, no carotid bruits, no JVD. Lungs bilateral inspiratory crackles Cor normal S1, normal S2, regular rhythm, no murmur,  quiet precordium. Abdomen: Large obese protuberant abdomen with post cholecystectomy scar in right upper quadrant. Normoactive bowel sounds. Diffusely tender but no rebound. Rectal: Slightly decreased rectal sphincter tone. Soft Hemoccult negative stool. Extremities no pedal edema. Skin no lesions. Neurological alert and oriented x 3. Psychological normal mood and affect.  Assessment and Plan  Problem #1 Chronic diarrhea of at least 20 years duration resulting from multiple factors including irritable bowel syndrome, diabetic autonomic neuropathy and bile overflow diarrhea. She did  well intermittently in the past on Questran 4 g a day. We will resume Questran, have her to continue Bentyl 10 mg 3 times a day and we will schedule her for a colonoscopy. We need to rule out microscopic colitis. We will plan to do random biopsies.  Problem # Gastroesophageal reflux. Patient has  as history of hiatal hernia and stricture. She has recurrent dysphagia. We will schedule an upper endoscopy with possible dilation. She may be developing gastroparesis secondary to diabetes. I instructed her in  antireflux measures. She will increase Prilosec to 40 mg twice a day.   03/06/2011 Delfin Edis

## 2011-03-07 ENCOUNTER — Telehealth: Payer: Self-pay | Admitting: Internal Medicine

## 2011-03-07 NOTE — Telephone Encounter (Signed)
Patient states that she only got instructions for colonoscopy but she is to have endoscopy/colonoscopy. I have advised patient that she is scheduled for both, but that the colonoscopy is the more detailed prep which is why she got colonoscopy prep. Patient also states that she is "scared now" because propofol is "that stuff that killed Roe Rutherford." I have explained to patient that propofol is a safe medication when administered properly as was NOT the case with Roe Rutherford. I have advised her that we have CRNA's staffed to administer the medication appropriately and with the correct monitoring. Patient is also very concerned about billing. I have spoken to Cora Daniels, our Scientist, clinical (histocompatibility and immunogenetics) and she states that typically people covered on medicaid and medicare have no bill. I have however given her the number to our billing department. Patient verbalizes understanding of all of the above.

## 2011-03-15 ENCOUNTER — Telehealth: Payer: Self-pay | Admitting: Internal Medicine

## 2011-03-15 MED ORDER — PROMETHAZINE-CODEINE 6.25-10 MG/5ML PO SYRP: ORAL_SOLUTION | ORAL | Status: DC

## 2011-03-15 NOTE — Telephone Encounter (Signed)
I spoke with pt and she states Dr. Maurene Capes is wanting to do a colonoscopy on her next week. Pt states she is cared to have this done bc he breathing has been bad. Pt is wanting to know if Dr. Annamaria Boots feels she will be okay to have this done. Pt states she has been doing her neb tx's up to 3 times a day. Pt states she is still coughing up clear phlem and would also like a refill on her promethazine cough syrup. Pt states she is feeling some better since she last saw Dr. Annamaria Boots.  Please advise Dr. Annamaria Boots, thanks  Allergies  Allergen Reactions  . Librax Other (See Comments)    Sleepy,weak  . Codeine     REACTION: gi upset  . Tramadol     Charma Igo, [place]

## 2011-03-15 NOTE — Telephone Encounter (Signed)
Pt aware we will call cough syrup to her pharmacy and CDY said it  Was okay for her to have colonoscopy. Pr verbalized understanding.

## 2011-03-15 NOTE — Telephone Encounter (Signed)
Per CY-okay to give refill this time on promethazine cough syrup and ok to have colonoscopy as well.

## 2011-03-19 ENCOUNTER — Telehealth: Payer: Self-pay | Admitting: Gastroenterology

## 2011-03-19 NOTE — Telephone Encounter (Signed)
Pt called concerned about hypoglycemia.  She's prepping for colonoscopy for the am and has noted BS of 124.  She' s c/o slight weakness. SHe was instructed to take coke as needed to maintain her glucose above 100.

## 2011-03-20 ENCOUNTER — Encounter: Payer: Self-pay | Admitting: Internal Medicine

## 2011-03-20 ENCOUNTER — Ambulatory Visit (AMBULATORY_SURGERY_CENTER): Payer: Medicare Other | Admitting: Internal Medicine

## 2011-03-20 DIAGNOSIS — R197 Diarrhea, unspecified: Secondary | ICD-10-CM

## 2011-03-20 DIAGNOSIS — R1319 Other dysphagia: Secondary | ICD-10-CM

## 2011-03-20 DIAGNOSIS — K219 Gastro-esophageal reflux disease without esophagitis: Secondary | ICD-10-CM

## 2011-03-20 DIAGNOSIS — Z1211 Encounter for screening for malignant neoplasm of colon: Secondary | ICD-10-CM

## 2011-03-20 MED ORDER — SODIUM CHLORIDE 0.9 % IV SOLN: 500.0000 mL | INTRAVENOUS | Status: DC

## 2011-03-20 NOTE — Progress Notes (Signed)
Patient stating she was unable to complete her prep today, took all but the last "line".Complained of vomiting and dry heeves this am. States her bowel movements are brownish yet no solids, all liquid. Patient stating she will not tolerate any extra covers related to clostrophobia. Patient uses 02 24/7 at 2-3 L/min. 02 presently at 2.

## 2011-03-21 ENCOUNTER — Inpatient Hospital Stay: Payer: Medicare Other | Admitting: Adult Health

## 2011-03-21 ENCOUNTER — Telehealth: Payer: Self-pay | Admitting: *Deleted

## 2011-03-21 ENCOUNTER — Telehealth: Payer: Self-pay | Admitting: Internal Medicine

## 2011-03-21 NOTE — Telephone Encounter (Signed)
Yes, continue Magnesium if [name] wants her to take it.

## 2011-03-21 NOTE — Telephone Encounter (Signed)

## 2011-03-21 NOTE — Telephone Encounter (Signed)
Patient wants Dr [name] to know that her primary care MD [name] has started her on Magnesium 250 mg daily. Her lab results showed she is still low on Mag.  He has told her to take the magnesium on days she is not having diarrhea.  She is asking if Dr [name] thinks that she should continue this?

## 2011-03-22 MED ORDER — MAGNESIUM 250 MG PO TABS: 1.0000 | ORAL_TABLET | Freq: Every day | ORAL | Status: DC

## 2011-03-22 NOTE — Telephone Encounter (Signed)
Patient advised.  She reports she does not understand how he wants her to take the Imperial. She is advised she will need to call back and clarify how he wants her to take it.  She wants Dr [name] to give instructions for taking it, once again she was advised she will need to clarify with Dr Ronnie Derby office how she should take Magnesium.

## 2011-03-23 ENCOUNTER — Telehealth: Payer: Self-pay | Admitting: Internal Medicine

## 2011-03-23 NOTE — Telephone Encounter (Signed)
I think she will have to keep taking her Questran and also the Magnesium pills are contributing to her diarrhea. She will have to ask [name] if there is any Mag preparation which would NOT cause diarrhea

## 2011-03-23 NOTE — Telephone Encounter (Signed)
Spoke with patient and she states she had one firm stool this AM then started having liquid diarrhea. States she has messed up her clothes it is so bad. She did not take her Questran on Monday but has taken it since then. She did take a Magnesium pill last night as per Dr. Dwyane Dee.  Hx chronic diarrhea.Please, advise.

## 2011-03-23 NOTE — Telephone Encounter (Signed)
Spoke with patient and gave her Dr. Nichola Sizer recommendations.

## 2011-03-24 ENCOUNTER — Encounter: Payer: Self-pay | Admitting: Internal Medicine

## 2011-03-27 ENCOUNTER — Telehealth: Payer: Self-pay | Admitting: Internal Medicine

## 2011-03-27 NOTE — Telephone Encounter (Signed)
Patient states she is feeling extremely bloated. She is taking Prilosec BID, Bentyl TID, Questran. She states she is not having diarrhea now. She had her last bowel movement last Thursday. She also report GERD on last Friday all day but it got better when she took Pepto bismol. Please advise on what she should take for bloating.

## 2011-03-27 NOTE — Telephone Encounter (Signed)
Questran causes bloating. She may reduce the Questran to 1/2 pack qod,  But don't stop it since the diarrhea would relapse

## 2011-03-28 NOTE — Telephone Encounter (Signed)
Patient notified of Dr. Nichola Sizer recommendation

## 2011-04-03 ENCOUNTER — Telehealth: Payer: Self-pay | Admitting: Internal Medicine

## 2011-04-03 MED ORDER — AZITHROMYCIN 250 MG PO TABS: ORAL_TABLET | ORAL | Status: AC

## 2011-04-03 NOTE — Telephone Encounter (Signed)
PATIENT RETURNED CALL.  SHE IS ANGRY AND UPSET.  PLEASE CALL BACK

## 2011-04-03 NOTE — Telephone Encounter (Signed)
I spoke with pt and is aware of rx for zpak has been sent to pharmacy and needed nothing further

## 2011-04-03 NOTE — Telephone Encounter (Signed)
Ok to send Z pak

## 2011-04-03 NOTE — Telephone Encounter (Signed)
I called [facility] and was advised pt came in to see if she had an abx called in for her. Corene Cornea states pt left to go run errands. I called pt # listed and had to [facility]

## 2011-04-03 NOTE — Telephone Encounter (Signed)
ATC line busy x 3 wcb 

## 2011-04-03 NOTE — Telephone Encounter (Signed)
Pt c/o increased SOB, cough with green mucus, weakness and runny nose x 1 week. Pt is requesting a Zpak be called to her pharmacy. Pls advise. Allergies  Allergen Reactions  . Librax Other (See Comments)    Sleepy,weak  . Codeine     REACTION: gi upset  . Tramadol

## 2011-04-03 NOTE — Telephone Encounter (Signed)
JASON AT [facility] SAYS PT IS THERE "NOW" AND WANTS RX. [phone]. Mariann Laster

## 2011-04-14 ENCOUNTER — Telehealth: Payer: Self-pay | Admitting: Internal Medicine

## 2011-04-14 NOTE — Telephone Encounter (Signed)
She reports continued burning chest pain in the middle of my chest despite Bentyl TID and Prilosec are minimally controlling it.  She is taking atrovent Nebs 4 times a day she is wondering if you thought this would cause some of her chest pain? She is wondering what else she can take for the discomfort.

## 2011-04-14 NOTE — Telephone Encounter (Signed)
May be she has a Candida esophagitis. Majic mouthwash swish and swallow qid x 3-4 day would take care of it. Another possibility for her soreness is that it is from coughing  when she uses nebulizer??

## 2011-04-17 MED ORDER — MAGIC MOUTHWASH: 10.0000 mL | Freq: Four times a day (QID) | ORAL | Status: DC

## 2011-04-17 NOTE — Telephone Encounter (Signed)
Patient advised.  She will call back for any further problems

## 2011-05-08 ENCOUNTER — Telehealth: Payer: Self-pay | Admitting: Internal Medicine

## 2011-05-08 MED ORDER — DOXYCYCLINE HYCLATE 100 MG PO TABS: ORAL_TABLET | ORAL | Status: DC

## 2011-05-08 NOTE — Telephone Encounter (Signed)
Pt is aware of cdy recs. rx has been sent to the pharmacy.

## 2011-05-08 NOTE — Telephone Encounter (Signed)
Spoke with patient states started out with a dry hacky cough,now has a productive cough (greenish phlem) Increased SOB,Wheezing, Body Aches,Chills, Requesting A rx for an abx. Allergies  Allergen Reactions  . Librax Other (See Comments)    Sleepy,weak  . Codeine     REACTION: gi upset  . Tramadol    Pharmacy is [facility] in [place]. Dr Annamaria Boots Please advise.

## 2011-05-08 NOTE — Telephone Encounter (Signed)
Suggest doxycycline 100 mg, # 8, 2 today then 1 daily. Push fluids.

## 2011-05-10 DIAGNOSIS — E109 Type 1 diabetes mellitus without complications: Secondary | ICD-10-CM | POA: Diagnosis not present

## 2011-05-10 DIAGNOSIS — R0989 Other specified symptoms and signs involving the circulatory and respiratory systems: Secondary | ICD-10-CM | POA: Diagnosis not present

## 2011-05-10 DIAGNOSIS — I10 Essential (primary) hypertension: Secondary | ICD-10-CM | POA: Diagnosis not present

## 2011-05-10 DIAGNOSIS — R079 Chest pain, unspecified: Secondary | ICD-10-CM | POA: Diagnosis not present

## 2011-05-10 DIAGNOSIS — R0609 Other forms of dyspnea: Secondary | ICD-10-CM | POA: Diagnosis not present

## 2011-05-10 DIAGNOSIS — I2510 Atherosclerotic heart disease of native coronary artery without angina pectoris: Secondary | ICD-10-CM | POA: Diagnosis not present

## 2011-05-15 DIAGNOSIS — E785 Hyperlipidemia, unspecified: Secondary | ICD-10-CM | POA: Diagnosis not present

## 2011-05-15 DIAGNOSIS — IMO0001 Reserved for inherently not codable concepts without codable children: Secondary | ICD-10-CM | POA: Diagnosis not present

## 2011-05-15 DIAGNOSIS — R5383 Other fatigue: Secondary | ICD-10-CM | POA: Diagnosis not present

## 2011-05-15 DIAGNOSIS — E559 Vitamin D deficiency, unspecified: Secondary | ICD-10-CM | POA: Diagnosis not present

## 2011-05-15 DIAGNOSIS — R5381 Other malaise: Secondary | ICD-10-CM | POA: Diagnosis not present

## 2011-05-20 ENCOUNTER — Other Ambulatory Visit: Payer: Self-pay | Admitting: Internal Medicine

## 2011-05-22 DIAGNOSIS — E109 Type 1 diabetes mellitus without complications: Secondary | ICD-10-CM | POA: Diagnosis not present

## 2011-05-22 DIAGNOSIS — R0609 Other forms of dyspnea: Secondary | ICD-10-CM | POA: Diagnosis not present

## 2011-05-22 DIAGNOSIS — I10 Essential (primary) hypertension: Secondary | ICD-10-CM | POA: Diagnosis not present

## 2011-05-22 DIAGNOSIS — R079 Chest pain, unspecified: Secondary | ICD-10-CM | POA: Diagnosis not present

## 2011-05-23 NOTE — Telephone Encounter (Signed)
Please advise if okay to refill as requested; Pt has pending appt on 08-28-11 with you. Thanks.

## 2011-05-24 ENCOUNTER — Telehealth: Payer: Self-pay | Admitting: Internal Medicine

## 2011-05-24 MED ORDER — AMOXICILLIN 500 MG PO TABS: 500.0000 mg | ORAL_TABLET | Freq: Three times a day (TID) | ORAL | Status: AC

## 2011-05-24 NOTE — Telephone Encounter (Signed)
Pt aware of abx prescription and will call if her sx do not improve or get worse.

## 2011-05-24 NOTE — Telephone Encounter (Signed)
Spoke with pt. She states that her bronchitis is not improving since she called here on 12/31 and we prescribed doxy for her. She states that the cough is no worse, and is mainly dry now, but sometimes able to produce moderate amounts of green sputum. She states her chest and neck feel very sore- relates this is the violent coughing spells. No appts open with CDY. Please advise, thanks! Allergies  Allergen Reactions  . Librax Other (See Comments)    Sleepy,weak  . Codeine     REACTION: gi upset  . Tramadol

## 2011-05-24 NOTE — Telephone Encounter (Signed)
Per CY-give patient Amoxicillin 500 mg #30 take 1 po tid x 10 days no refills.

## 2011-05-25 NOTE — Telephone Encounter (Signed)
Per CY-okay to refill this time only; faxed written order to pharmacy with CY's signature.

## 2011-06-09 ENCOUNTER — Telehealth: Payer: Self-pay | Admitting: Internal Medicine

## 2011-06-09 MED ORDER — VALACYCLOVIR HCL 1 G PO TABS: 1000.0000 mg | ORAL_TABLET | Freq: Two times a day (BID) | ORAL | Status: DC

## 2011-06-09 NOTE — Telephone Encounter (Signed)
I spoke with pt and she c/o increase SOB (using 3L oxygen 24/7), coughing is getting better but is getting up green phlem, nasal congestion, runny nose (clear mucus), top lip is full of blisters x yesterday. Pt states this has been going on x couple months. Pt states she still has bronchitis. Using her neb tx's. Pt finished amoxicillin Sunday. Pt states she can't come in for OV until next week if she can be worked in. She has had zpak, doxycyline and amoxicillin since November. Please advise Dr. Annamaria Boots, thanks  Allergies  Allergen Reactions  . Librax Other (See Comments)    Sleepy,weak  . Codeine     REACTION: gi upset  . Tramadol      [facility] [address]

## 2011-06-09 NOTE — Telephone Encounter (Signed)
Per Cy-he gave Valacyclovir due to lip full of blisters; offer her Amoxicillin 500 mg #30 take 1 po tid x 10 days no refills.

## 2011-06-09 NOTE — Telephone Encounter (Signed)
Spoke with pt and notified of recs per CDY. She states that she just finished abx and she does not think "pumping me full of more will do any good"- she states that she just thought that CDY may suggest giving it a few more days to see how she does. She then started c/o the same symptoms discussed previously- cough, congestion and SOB and states that something needs to be done. I again, offered to call in the abx for her and she refused and states "just let it go, goodbye" and hung up on me. Will forward to CDY so that he is aware.

## 2011-06-09 NOTE — Telephone Encounter (Signed)
Noted by CY.

## 2011-06-09 NOTE — Telephone Encounter (Signed)
Rx was sent to [facility] [place]- ATC and inform the pt, line was busy, [facility]

## 2011-06-09 NOTE — Telephone Encounter (Signed)
Per CY-okay to give Valacyclovir 1g #2 take 1 tablet twice daily no refills.

## 2011-06-09 NOTE — Telephone Encounter (Signed)
Spoke with pt and notified of recs per CDY. She states that she does not understand why we are prescribing this. She states she still has bronchitis and she only has "a cold sore on lip". She is requesting more meds for bronchitis- (see symptoms below). I offered to look for an appt with CDY for her and before I could even give her a date for appt she states " I just do not see what good that will do" and states that she prefers that something just be called in again. CDY, please advise, thanks Allergies  Allergen Reactions  . Librax Other (See Comments)    Sleepy,weak  . Codeine     REACTION: gi upset  . Tramadol

## 2011-07-20 DIAGNOSIS — E785 Hyperlipidemia, unspecified: Secondary | ICD-10-CM | POA: Diagnosis not present

## 2011-07-20 DIAGNOSIS — R5383 Other fatigue: Secondary | ICD-10-CM | POA: Diagnosis not present

## 2011-07-20 DIAGNOSIS — I10 Essential (primary) hypertension: Secondary | ICD-10-CM | POA: Diagnosis not present

## 2011-07-20 DIAGNOSIS — R5381 Other malaise: Secondary | ICD-10-CM | POA: Diagnosis not present

## 2011-07-20 DIAGNOSIS — E559 Vitamin D deficiency, unspecified: Secondary | ICD-10-CM | POA: Diagnosis not present

## 2011-07-20 DIAGNOSIS — IMO0001 Reserved for inherently not codable concepts without codable children: Secondary | ICD-10-CM | POA: Diagnosis not present

## 2011-08-07 ENCOUNTER — Telehealth: Payer: Self-pay | Admitting: Internal Medicine

## 2011-08-07 NOTE — Telephone Encounter (Signed)
Last colon 03/2011- diabetic neuropathy and post-cholecystectomy diarrhea causing diarrhea and rectal irritation. Please send Hydrocortisone cream 1% to apply to rectum bid x 1 week then prn, disp 15 gm, also make sure she is on a low fat carb modified diet  which may reduce her diarrhea.

## 2011-08-07 NOTE — Telephone Encounter (Signed)
Calling to report she is not doing better since her procedures in Nov. States she takes the Questran QOD because she got constipated when she took it daily. Reports she had abdominal pain on Saturday night. Then she had a soft, formed stool. When she wiped, she saw blood on the tissue. She also saw blood on the stool in the toilet. Denies that the stool was hard. States the stool was brown in color. She had a diarrhea stool during the night but did not see any blood. States she has not had any more bleeding. She is concerned because she still has pain off and on that is relieved sometimes by her "blue pill" and she still has diarrhea. Wanted to be sure Dr. [name] knows she had the blood in her stool on Saturday.

## 2011-08-08 MED ORDER — PRAMOXINE-HC 1-1 % EX CREA: TOPICAL_CREAM | CUTANEOUS | Status: DC

## 2011-08-08 NOTE — Telephone Encounter (Signed)
Rx sent to pharmacy. Spoke with patient and gave patient Dr. Nichola Sizer recommendations.

## 2011-08-09 DIAGNOSIS — G5600 Carpal tunnel syndrome, unspecified upper limb: Secondary | ICD-10-CM | POA: Diagnosis not present

## 2011-08-10 ENCOUNTER — Telehealth: Payer: Self-pay | Admitting: Internal Medicine

## 2011-08-10 DIAGNOSIS — M549 Dorsalgia, unspecified: Secondary | ICD-10-CM

## 2011-08-10 DIAGNOSIS — J449 Chronic obstructive pulmonary disease, unspecified: Secondary | ICD-10-CM

## 2011-08-10 NOTE — Telephone Encounter (Signed)
lmomtcb x1 

## 2011-08-10 NOTE — Telephone Encounter (Signed)
Called and spoke with pt.  Pt states she is scheduled to see CY on 08/28/11 but would like to be seen next week if possible for increased pain across back x 2 to 3 weeks.  States she believes this is due to her lungs.  Pain is intermittent.  Also c/o increased sob with exertion but denies cough, f/c/s.  Pt is aware CY is off today and tomorrow and is ok to wait until Monday for a response to see if CY would ok to overbook schedule.  Pt is aware to seek emergency care in the meantime if symptoms worsen.   Allergies  Allergen Reactions  . Librax Other (See Comments)    Sleepy,weak  . Codeine     REACTION: gi upset  . Tramadol

## 2011-08-15 NOTE — Telephone Encounter (Signed)
I spoke with pt and she stated she can;t come in today. Since cdy had an opening at 9:15 in the AM pt is scheduled to come in at that time. Pt is aware ot have cxr done first.

## 2011-08-15 NOTE — Telephone Encounter (Signed)
Please have pt come in today for CXR prior to 415pm appt-pt will need to be put on schedule and CXR entered in STAT in EPIC DX: COPD and upper back pain. Thanks.

## 2011-08-16 ENCOUNTER — Encounter: Payer: Self-pay | Admitting: Internal Medicine

## 2011-08-16 ENCOUNTER — Ambulatory Visit (INDEPENDENT_AMBULATORY_CARE_PROVIDER_SITE_OTHER)
Admission: RE | Admit: 2011-08-16 | Discharge: 2011-08-16 | Disposition: A | Discharge status: Home / Self Care | Payer: Medicare Other | Source: Ambulatory Visit | Attending: Internal Medicine | Admitting: Internal Medicine

## 2011-08-16 ENCOUNTER — Ambulatory Visit (INDEPENDENT_AMBULATORY_CARE_PROVIDER_SITE_OTHER): Payer: Medicare Other | Admitting: Internal Medicine

## 2011-08-16 VITALS — BP 104/72 | HR 90 | Ht 64.0 in | Wt 197.4 lb

## 2011-08-16 DIAGNOSIS — J449 Chronic obstructive pulmonary disease, unspecified: Secondary | ICD-10-CM

## 2011-08-16 DIAGNOSIS — R29818 Other symptoms and signs involving the nervous system: Secondary | ICD-10-CM | POA: Diagnosis not present

## 2011-08-16 DIAGNOSIS — R0602 Shortness of breath: Secondary | ICD-10-CM | POA: Diagnosis not present

## 2011-08-16 DIAGNOSIS — R079 Chest pain, unspecified: Secondary | ICD-10-CM | POA: Diagnosis not present

## 2011-08-16 DIAGNOSIS — M546 Pain in thoracic spine: Secondary | ICD-10-CM | POA: Diagnosis not present

## 2011-08-16 DIAGNOSIS — R05 Cough: Secondary | ICD-10-CM | POA: Diagnosis not present

## 2011-08-16 DIAGNOSIS — M549 Dorsalgia, unspecified: Secondary | ICD-10-CM

## 2011-08-16 DIAGNOSIS — R059 Cough, unspecified: Secondary | ICD-10-CM | POA: Diagnosis not present

## 2011-08-16 NOTE — Patient Instructions (Signed)
This pain may be part of your diabetic neuropathy.  Try Advil or tylenol and try heating pad or cold pack on your back as needed.

## 2011-08-16 NOTE — Progress Notes (Signed)
Patient ID: [name], female    DOB: 05-Sep-1935, 76 y.o.   MRN: [id number]  HPI SATURATION QUALIFICATIONS: Patient Saturations on Room Air while Ambulating = 84% Patient Saturations on 2.5 Liters of oxygen while Ambulating = 92%[name],CMA  09/28/10-  75 yoF former smoker with COPD, complicated by chronic rhinitis, GERD. Last here 04/29/10- note reviewed. Acute visit.-- Woke with soreness across under her breasts. Losing voice. Coughing spells, with much liquid coming up and from nose. Prior to that hadn't been coughing. C/o weak, dizzy, light headed- relieved by her oxygen. . Moving into a smaller apartment. Denies fever. Easy DOE short walks room to room.   10/31/10- 89 yoF former smoker with COPD, complicated by chronic rhinitis, GERD. She denies major change since last here. Has to stay in to avoid heat and on her oxygen much of the time in the house. Coughs scant clear or lemon yellow, with some hoarseness which comes and goes. She describes hoarseness as worse with phone conversations, variable but rarely if ever clear. She had seen Dr [name] years ago for epistaxis. Easy choke with eating. Discussed aspiration precautions, chin tuck.   02/26/11- 19 yoF former smoker with COPD, complicated by chronic rhinitis, GERD. Has had flu vaccine. No coughing through the summer. We first cool days as she stepped outside she would begin to cough again. Dry hacking cough. Very aware of active reflux anytime she bends over, regurgitating recently swallowed food. Has also had some diarrhea. Complains of spasms intermittently left lower anterior chest and left upper quadrant. It may all be GI. She thinks it is part of her diabetic neuropathy. Has appointment soon with Dr.Brodie.  08/16/11-75 yoF former smoker with COPD, complicated by chronic rhinitis, GERD. During the winter had prolonged diarrhea evaluated by Dr [name] and apparently attributed to her diabetic neuropathy. May have had  esophagitis-was told to take Magic mouthwash. Gradually easier dyspnea on exertion. Now on oxygen 2-3 L most of the time. She came today with acute complaint of aching pain intermittently for the past several weeks, dull. Associated with mid back at strap level. Pain is gone right now. She saw no effect of activity or position and said pain was nonpleuritic or radiating.  CXR- 08/16/11- reviewed w/ her. NAD with no acute process.   Review of Systems-see HPI Constitutional:   No-   weight loss, night sweats, fevers, chills, fatigue, lassitude. HEENT:   No-  headaches, difficulty swallowing, tooth/dental problems, sore throat,       No-  sneezing, itching, ear ache, nasal congestion, post nasal drip,  CV:  N o-chest pain, orthopnea, PND, swelling in lower extremities, anasarca, dizziness, palpitations Resp: No- acute  shortness of breath with exertion or at rest.              No-   productive cough,  + recent non-productive cough,  No- coughing up of blood.              No-   change in color of mucus.  No- wheezing.   Skin: No-   rash or lesions. GI:  No-   heartburn, indigestion,   +abdominal pain, nausea, vomiting,   +diarrhea,  GU:  MS:  No-   joint pain or swelling.    + back pain. Neuro-     nothing unusual Psych:  No- change in mood or affect. No depression or anxiety.  No memory loss.      Objective:   Physical Exam General-  Alert, Oriented, Affect-appropriate, Distress- none acute. Talkative. O2 sat 93% on O2 2.5 L Skin- rash-none, lesions- none, excoriation- none Lymphadenopathy- none Head- atraumatic            Eyes- Gross vision intact, PERRLA, conjunctivae clear secretions            Ears- Hearing, canals-normal            Nose- Clear, no-Septal dev, mucus, polyps, erosion, perforation             Throat- Mallampati II , mucosa clear , drainage- none, tonsils- atrophic Neck- flexible , trachea midline, no stridor , thyroid nl, carotid no bruit Chest - symmetrical excursion ,  unlabored           Heart/CV- RRR , no murmur , no gallop  , no rub, nl s1 s2                           - JVD- none , edema- none, stasis changes- none, varices- none           Lung- diminished, clear, wheeze- none, cough- none , dullness-none, rub- none           Chest wall- back is not tender Abd-  Br/ Gen/ Rectal- Not done, not indicated Extrem- cyanosis- none, clubbing, none, atrophy- none, strength- nl Neuro- grossly intact to observation

## 2011-08-20 NOTE — Assessment & Plan Note (Signed)
Slow decline in breathing, reflecting progression of disease and obesity with deconditioning.

## 2011-08-20 NOTE — Assessment & Plan Note (Addendum)
Nonspecific back pain. Although quality is not "burning", neuropathic pain is a possibility as it comes and goes it is unrelated to anything she can identify. We discussed arthritic changes in the spine.

## 2011-08-22 DIAGNOSIS — R42 Dizziness and giddiness: Secondary | ICD-10-CM | POA: Diagnosis not present

## 2011-08-22 DIAGNOSIS — I10 Essential (primary) hypertension: Secondary | ICD-10-CM | POA: Diagnosis not present

## 2011-08-22 DIAGNOSIS — J438 Other emphysema: Secondary | ICD-10-CM | POA: Diagnosis not present

## 2011-08-28 ENCOUNTER — Ambulatory Visit (INDEPENDENT_AMBULATORY_CARE_PROVIDER_SITE_OTHER): Payer: Medicare Other | Admitting: Internal Medicine

## 2011-08-28 ENCOUNTER — Encounter: Payer: Self-pay | Admitting: Internal Medicine

## 2011-08-28 VITALS — BP 138/78 | HR 99 | Ht 64.0 in | Wt 197.6 lb

## 2011-08-28 DIAGNOSIS — J310 Chronic rhinitis: Secondary | ICD-10-CM

## 2011-08-28 DIAGNOSIS — J449 Chronic obstructive pulmonary disease, unspecified: Secondary | ICD-10-CM | POA: Diagnosis not present

## 2011-08-28 MED ORDER — AMOXICILLIN 500 MG PO CAPS: 500.0000 mg | ORAL_CAPSULE | Freq: Three times a day (TID) | ORAL | Status: AC

## 2011-08-28 MED ORDER — PROMETHAZINE-CODEINE 6.25-10 MG/5ML PO SYRP: 5.0000 mL | ORAL_SOLUTION | Freq: Four times a day (QID) | ORAL | Status: DC | PRN

## 2011-08-28 NOTE — Progress Notes (Signed)
Patient ID: [name], female    DOB: [birth year], 76 y.o.   MRN: [id number]  HPI SATURATION QUALIFICATIONS: Patient Saturations on Room Air while Ambulating = 84% Patient Saturations on 2.5 Liters of oxygen while Ambulating = 92%[name],CMA  09/28/10-  75 yoF former smoker with COPD, complicated by chronic rhinitis, GERD. Last here 04/29/10- note reviewed. Acute visit.-- Woke with soreness across under her breasts. Losing voice. Coughing spells, with much liquid coming up and from nose. Prior to that hadn't been coughing. C/o weak, dizzy, light headed- relieved by her oxygen. . Moving into a smaller apartment. Denies fever. Easy DOE short walks room to room.   10/31/10- 41 yoF former smoker with COPD, complicated by chronic rhinitis, GERD. She denies major change since last here. Has to stay in to avoid heat and on her oxygen much of the time in the house. Coughs scant clear or lemon yellow, with some hoarseness which comes and goes. She describes hoarseness as worse with phone conversations, variable but rarely if ever clear. She had seen Dr [name] years ago for epistaxis. Easy choke with eating. Discussed aspiration precautions, chin tuck.   02/26/11- 88 yoF former smoker with COPD, complicated by chronic rhinitis, GERD. Has had flu vaccine. No coughing through the summer. We first cool days as she stepped outside she would begin to cough again. Dry hacking cough. Very aware of active reflux anytime she bends over, regurgitating recently swallowed food. Has also had some diarrhea. Complains of spasms intermittently left lower anterior chest and left upper quadrant. It may all be GI. She thinks it is part of her diabetic neuropathy. Has appointment soon with Dr.Brodie.  08/16/11-75 yoF former smoker with COPD, complicated by chronic rhinitis, GERD. During the winter had prolonged diarrhea evaluated by Dr [name] and apparently attributed to her diabetic neuropathy. May have had  esophagitis-was told to take Magic mouthwash. Gradually easier dyspnea on exertion. Now on oxygen 2-3 L most of the time. She came today with acute complaint of aching pain intermittently for the past several weeks, dull. Associated with mid back at strap level. Pain is gone right now. She saw no effect of activity or position and said pain was nonpleuritic or radiating.  CXR- 08/16/11- reviewed w/ her. NAD with no acute process.   08/28/11-75 yoF former smoker with COPD, complicated by chronic rhinitis, GERD,  DM. Back pain comes and goes. She implies that it is better today and says that she really has been present a long time as part of her degenerative back pain. Complains of feeling sluggish and tired. Recent bronchitis with productive green sputum watery nose no fever. Using Atrovent. Intolerant of albuterol and Symbicort because of overstimulation. Home oxygen is 2-1/2-3 L. CXR 08/15/11-  IMPRESSION:  Hyperinflation.  No active cardiopulmonary disease.  Original Report Authenticated By: [name], M.D.   Review of Systems-see HPI Constitutional:   No-   weight loss, night sweats, fevers, chills,  +tigue, lassitude. HEENT:   No-  headaches, difficulty swallowing, tooth/dental problems, sore throat,       No-  sneezing, itching, ear ache, nasal congestion, post nasal drip,  CV:  No-chest pain, orthopnea, PND, swelling in lower extremities, anasarca, dizziness, palpitations Resp: No- acute  shortness of breath with exertion or at rest.              No-   productive cough,  + recent non-productive cough,  No- coughing up of blood.  No-   change in color of mucus.  No- wheezing.   Skin: No-   rash or lesions. GI:  No-   heartburn, indigestion,   +abdominal pain, nausea, vomiting,   +diarrhea,  GU:  MS:  No-   joint pain or swelling.    + back pain. Neuro-     nothing unusual Psych:  No- change in mood or affect. + depression or anxiety.  No memory loss.      Objective:    Physical Exam General- Alert, Oriented, Affect-animated, cheerful, Distress- none acute. Talkative. O2 sat 93% on O2 2.5 L Skin- rash-none, lesions- none, excoriation- none Lymphadenopathy- none Head- atraumatic            Eyes- Gross vision intact, PERRLA, conjunctivae clear secretions            Ears- Hearing, canals-normal            Nose- Clear, no-Septal dev, mucus, polyps, erosion, perforation             Throat- Mallampati II , mucosa clear , drainage- none, tonsils- atrophic Neck- flexible , trachea midline, no stridor , thyroid nl, carotid no bruit Chest - symmetrical excursion , unlabored           Heart/CV- RRR , no murmur , no gallop  , no rub, nl s1 s2                           - JVD- none , edema- none, stasis changes- none, varices- none           Lung- diminished, clear, wheeze- none, cough- none , dullness-none, rub- none           Chest wall- back is not tender Abd-  Br/ Gen/ Rectal- Not done, not indicated Extrem- cyanosis- none, clubbing, none, atrophy- none, strength- nl Neuro- grossly intact to observation

## 2011-08-28 NOTE — Patient Instructions (Signed)
Script sent for amoxacillin antibiotic  Script printed for cough syrup.  Sample Spiriva   1 daily  Ok to run O2 at 4 L if needed when you are active.

## 2011-08-30 DIAGNOSIS — R55 Syncope and collapse: Secondary | ICD-10-CM | POA: Diagnosis not present

## 2011-08-30 DIAGNOSIS — R5381 Other malaise: Secondary | ICD-10-CM | POA: Diagnosis not present

## 2011-08-30 DIAGNOSIS — E559 Vitamin D deficiency, unspecified: Secondary | ICD-10-CM | POA: Diagnosis not present

## 2011-08-30 DIAGNOSIS — R5383 Other fatigue: Secondary | ICD-10-CM | POA: Diagnosis not present

## 2011-08-31 ENCOUNTER — Encounter (HOSPITAL_BASED_OUTPATIENT_CLINIC_OR_DEPARTMENT_OTHER): Payer: Self-pay

## 2011-08-31 ENCOUNTER — Ambulatory Visit (HOSPITAL_BASED_OUTPATIENT_CLINIC_OR_DEPARTMENT_OTHER): Admit: 2011-08-31 | Payer: Self-pay | Admitting: Orthopedic Surgery

## 2011-08-31 SURGERY — REPAIR, NAIL BED
Anesthesia: Regional | Laterality: Right

## 2011-08-31 NOTE — Assessment & Plan Note (Signed)
Suggested generic Claritin which she has at home.

## 2011-08-31 NOTE — Assessment & Plan Note (Signed)
She describes chronic bronchitis symptoms but exam is again more consistent with a plain emphysema. Plan-may increase oxygen to 4 L during exertion if needed. Refill cough syrup. Retry Spiriva.

## 2011-09-04 DIAGNOSIS — E559 Vitamin D deficiency, unspecified: Secondary | ICD-10-CM | POA: Diagnosis not present

## 2011-09-04 DIAGNOSIS — IMO0001 Reserved for inherently not codable concepts without codable children: Secondary | ICD-10-CM | POA: Diagnosis not present

## 2011-09-04 DIAGNOSIS — I10 Essential (primary) hypertension: Secondary | ICD-10-CM | POA: Diagnosis not present

## 2011-09-05 ENCOUNTER — Telehealth: Payer: Self-pay | Admitting: Internal Medicine

## 2011-09-05 MED ORDER — TIOTROPIUM BROMIDE MONOHYDRATE 18 MCG IN CAPS: 18.0000 ug | ORAL_CAPSULE | Freq: Every day | RESPIRATORY_TRACT | Status: DC

## 2011-09-05 NOTE — Telephone Encounter (Signed)
rx has been sent and pt is aware

## 2011-09-05 NOTE — Telephone Encounter (Signed)
Per CY-okay to RX spiriva #30 1 daily with prn refills.

## 2011-09-05 NOTE — Telephone Encounter (Signed)
I spoke with pt and she states that the spiriva does help with her breathing. She states she feels better and her lungs feel better. She states she feels like she can take a better deep breathe in. Pt would like an rx called in for her bc she only has 2 capsule left. Please advise Dr. Annamaria Boots thanks   [facility] [address]

## 2011-10-12 ENCOUNTER — Telehealth: Payer: Self-pay | Admitting: Internal Medicine

## 2011-10-12 MED ORDER — AMOXICILLIN 500 MG PO CAPS: 500.0000 mg | ORAL_CAPSULE | Freq: Three times a day (TID) | ORAL | Status: AC

## 2011-10-12 NOTE — Telephone Encounter (Signed)
Spoke with pt. She c/o prod cough with large amounts of green sputum. She states that her breathing also seems to be slightly worse. No wheeze, CP, f/c/s. Pt requesting something be called in. Please advise, thanks! Allergies  Allergen Reactions  . Clidinium-Chlordiazepoxide Other (See Comments)    Sleepy,weak  . Codeine     REACTION: gi upset  . Tramadol

## 2011-10-12 NOTE — Telephone Encounter (Signed)
ATC # listed multiple times and was advised the # we have dialed could not be completed check the # and dial again. Magda Paganini tried calling # as well and received same message. wcb

## 2011-10-12 NOTE — Telephone Encounter (Signed)
Per CY---ok to call in amoxicillin 522m  #21  1 tablet by mouth tid until gone.  Called and spoke with pt and she is aware of rx that has been sent to her pharmacy.  Nothing further needed.

## 2011-10-12 NOTE — Telephone Encounter (Signed)
Sorry.  Correct # [phone]

## 2011-10-12 NOTE — Telephone Encounter (Signed)
ATC [phone] x 3 and line was busy

## 2011-10-20 ENCOUNTER — Telehealth: Payer: Self-pay | Admitting: Internal Medicine

## 2011-10-20 NOTE — Telephone Encounter (Signed)
Usually floating of the stool is not a significant sign if it is not associated with other symptoms such as diarrhea, weight loss. Pancreas may need to be checked. She had an ultrasound  of the pancreas in 2008 and it was normal. If it continues we will schedule her for upper abd. Sonogram.

## 2011-10-20 NOTE — Telephone Encounter (Signed)
Patient states she has noticed that her stools float in the toilet sometimes. She remembers watching Dr. Irena Cords and he said to let your MD know if this happens. States it does not happen all the time but it does it off and on. Should she be concerned? Please, advise.

## 2011-10-20 NOTE — Telephone Encounter (Signed)
Line busy, Will try again later.

## 2011-10-23 NOTE — Telephone Encounter (Signed)
Spoke with and gave her Dr. [name] recommendation. She will call back if it continues.

## 2011-10-26 DIAGNOSIS — IMO0001 Reserved for inherently not codable concepts without codable children: Secondary | ICD-10-CM | POA: Diagnosis not present

## 2011-10-26 DIAGNOSIS — R197 Diarrhea, unspecified: Secondary | ICD-10-CM | POA: Diagnosis not present

## 2011-10-26 DIAGNOSIS — E559 Vitamin D deficiency, unspecified: Secondary | ICD-10-CM | POA: Diagnosis not present

## 2011-10-26 DIAGNOSIS — I10 Essential (primary) hypertension: Secondary | ICD-10-CM | POA: Diagnosis not present

## 2011-10-30 DIAGNOSIS — K589 Irritable bowel syndrome without diarrhea: Secondary | ICD-10-CM | POA: Diagnosis not present

## 2011-11-06 ENCOUNTER — Telehealth: Payer: Self-pay | Admitting: Internal Medicine

## 2011-11-06 DIAGNOSIS — J449 Chronic obstructive pulmonary disease, unspecified: Secondary | ICD-10-CM

## 2011-11-06 NOTE — Telephone Encounter (Signed)
Per CY-have patient come in for CXR(run STAT) DX COPD and chest pain.

## 2011-11-06 NOTE — Telephone Encounter (Signed)
Spoke with pt and notified of recs per CDY. She verbalized understanding and states can not come for cxr until tomorrow am. I have placed the stat order for this and advised that should her symptoms worsen seek emergent care. She verbalized understanding.

## 2011-11-06 NOTE — Telephone Encounter (Signed)
I spoke with pt and she c/o pain that runs into her left shoulder and down into her lower back and severe hoarseness x over the weekend. The pain comes and goes and at times it's very severe. She feels like it is coming from her lungs. Pt requesting recs from Dr. Annamaria Boots please advise thanks  Allergies  Allergen Reactions  . Clidinium-Chlordiazepoxide Other (See Comments)    Sleepy,weak  . Codeine     REACTION: gi upset  . Tramadol

## 2011-11-07 ENCOUNTER — Ambulatory Visit (INDEPENDENT_AMBULATORY_CARE_PROVIDER_SITE_OTHER)
Admission: RE | Admit: 2011-11-07 | Discharge: 2011-11-07 | Disposition: A | Discharge status: Home / Self Care | Payer: Medicare Other | Source: Ambulatory Visit | Attending: Internal Medicine | Admitting: Internal Medicine

## 2011-11-07 DIAGNOSIS — J449 Chronic obstructive pulmonary disease, unspecified: Secondary | ICD-10-CM

## 2011-11-07 DIAGNOSIS — R079 Chest pain, unspecified: Secondary | ICD-10-CM | POA: Diagnosis not present

## 2011-11-07 DIAGNOSIS — R0602 Shortness of breath: Secondary | ICD-10-CM | POA: Diagnosis not present

## 2011-11-08 NOTE — Progress Notes (Signed)
Quick Note:  Pt aware of results. Sent to [name] per patient. ______

## 2011-11-18 ENCOUNTER — Other Ambulatory Visit: Payer: Self-pay | Admitting: Internal Medicine

## 2011-11-20 ENCOUNTER — Telehealth: Payer: Self-pay | Admitting: Internal Medicine

## 2011-11-20 MED ORDER — IPRATROPIUM BROMIDE HFA 17 MCG/ACT IN AERS: 2.0000 | INHALATION_SPRAY | Freq: Four times a day (QID) | RESPIRATORY_TRACT | Status: DC

## 2011-11-20 NOTE — Telephone Encounter (Signed)
Called, spoke with pt who is requesting rx for atrovent hfa - not atrovent neb.  Rx sent -- pt aware.

## 2011-11-22 DIAGNOSIS — G5600 Carpal tunnel syndrome, unspecified upper limb: Secondary | ICD-10-CM | POA: Diagnosis not present

## 2011-11-30 ENCOUNTER — Other Ambulatory Visit: Payer: Self-pay | Admitting: Orthopedic Surgery

## 2011-12-04 ENCOUNTER — Encounter (HOSPITAL_BASED_OUTPATIENT_CLINIC_OR_DEPARTMENT_OTHER): Payer: Self-pay | Admitting: *Deleted

## 2011-12-04 NOTE — Progress Notes (Signed)
Pt has multiple health problems-mostly copd  02 dependent-sob Diabetic,htn,has no family-wll have friend come dos-and work on someone to stay with her. She said dr sypher told her this would be done local-she has never had to have anyone stay with her in past. Will need istat

## 2011-12-06 NOTE — H&P (Signed)
[name] is an 76 y.o. female.   Chief Complaint: c/o chronic and progressive numbness and tingling right hand ulnar distribution HPI: Since her last office visit with [place] in December, 2011 she has had a number of medical predicaments develop.  She has progressive cervical degenerative disc disease. She has significant bronchitis and is now on ambulatory oxygen under the supervision of Dr. Keturah Barre.  The primary reason for consult today is increasing numbness and weakness of her right hand. She initially had numbness in the small finger and now has numbness in all fingers.  She feels her hand is weak. She has aching pain in the ulnar aspect of her forearm. Her pain extends proximally to the brachium. She also has pain with shoulder motion sometimes.    Past Medical History  Diagnosis Date  . Irritable bowel syndrome   . Other chronic nonalcoholic liver disease   . Esophageal reflux   . Allergic rhinitis, cause unspecified     Sinus CT Rec 12-23-2009  . 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 Sciatica   . Shingles 2010  . Steatohepatitis   . Diabetes mellitus   . Diverticulosis   . Hiatal hernia   . Esophageal stricture   . Diarrhea   . GERD (gastroesophageal reflux disease)   . Shortness of breath   . Hypertension   . Arthritis   . Glaucoma   . Complication of anesthesia     takes along time to wake up  . Oxygen deficiency     Past Surgical History  Procedure Date  . Cholecystectomy open 1978  . Liver biopsy 08-1992  . Parathyroid exploration   . Tonsillectomy and adenoidectomy   . Total abdominal hysterectomy   . Esophagogastroduodenoscopy 3557,32-20    H Hernia,es.stricture s/p dil 85F  . Colonoscopy 07-2001    mild diverticulosis  . Carpal tunnel release     right hand  . Cataract extraction     Bilateral    Family History  Problem Relation Age of Onset  . Lung cancer Mother     small  cell;Byssinosis  . Heart disease Father   . Lung cancer Sister   . Liver cancer Sister     ? mets from another area of the body  . Diabetes      grandmother  . Stroke Maternal Grandfather    Social History:  reports that she quit smoking about 23 years ago. Her smoking use included Cigarettes. She has never used smokeless tobacco. She reports that she does not drink alcohol or use illicit drugs.  Allergies:  Allergies  Allergen Reactions  . Clidinium-Chlordiazepoxide Other (See Comments)    Sleepy,weak  . Codeine     REACTION: gi upset  . Tramadol     No prescriptions prior to admission    No results found for this or any previous visit (from the past 48 hour(s)).  No results found.   Pertinent items are noted in HPI.  There were no vitals taken for this visit.  General appearance: alert Head: Normocephalic, without obvious abnormality Neck: WNL Resp: clear to auscultation bilaterally. Mildly decreased breath sounds bilaterally. Cardio: regular rate and rhythm. Distant tones. GI: normal findings: bowel sounds normal Extremities:  She is noted to have weakness of her ulnar intrinsics of the right hand.  She has no obvious atrophy.  Her pinch is weak, she cannot cross her fingers.  She cannot adduct the  small finger to the index finger. She has negative Froment's sign, but has pinch weakness compared to the left.  She has full range of motion of her elbow, forearm, wrist and fingers.  Her shoulder range of motion is mildly impaired most likely due to degenerative arthritis at the [facility] joint.  Her cervical motion is mildly impaired. She has forward flexion chin on chest extension at least 20, she can rotate and left 50 degrees without radicular signs or symptoms.    Pulses: 2+ and symmetric Skin: normal Neurologic: Grossly normal    Assessment/Plan Impression:   Ulnar entrapment neuropathy cubital tunnel.  Plan: To the OR for in-situ decompression ulnar nerve right  elbow.The procedure, risks,benefits and post-op course were discussed with the patient at length and they were in agreement with the plan.   DASNOIT,Robbin Loughmiller J 12/06/2011, 4:54 PM     [name] had a 90 minute experience at the holding area of [facility].  Unfortunately, despite signing a pre op check list at my office on 11/22/2011 during which she was advised that she would need a responsible adult with her for 24 hours following out patient surgery, she arrived without said arrangement secured.  She was counseled by the nursing staff, the CRNA and the anesthesiologist as to the rule book of safe outpatient surgery.  We offered 23 hour observation in the recovery care center.  I cleared her insurance status with the facility [facility].  She declined this opportunity to complete her care today.  She went home to reconsider her options.  No IV access was established.  A detailed cancellation note was dictated. [id number]  Cammie Sickle., MD

## 2011-12-06 NOTE — Progress Notes (Signed)
Had talked with dr Chriss Driver about pt-reviewed dr young and dr Osie Bond try to do regional

## 2011-12-07 ENCOUNTER — Encounter (HOSPITAL_BASED_OUTPATIENT_CLINIC_OR_DEPARTMENT_OTHER)
Admission: RE | Disposition: A | Discharge status: Home / Self Care | Payer: Self-pay | Source: Ambulatory Visit | Attending: Orthopedic Surgery

## 2011-12-07 ENCOUNTER — Encounter (HOSPITAL_BASED_OUTPATIENT_CLINIC_OR_DEPARTMENT_OTHER): Payer: Self-pay | Admitting: Certified Registered Nurse Anesthetist

## 2011-12-07 ENCOUNTER — Ambulatory Visit (HOSPITAL_BASED_OUTPATIENT_CLINIC_OR_DEPARTMENT_OTHER)
Admission: RE | Admit: 2011-12-07 | Discharge: 2011-12-07 | Disposition: A | Discharge status: Home / Self Care | Payer: Medicare Other | Source: Ambulatory Visit | Attending: Orthopedic Surgery | Admitting: Orthopedic Surgery

## 2011-12-07 ENCOUNTER — Encounter (HOSPITAL_BASED_OUTPATIENT_CLINIC_OR_DEPARTMENT_OTHER): Payer: Self-pay | Admitting: *Deleted

## 2011-12-07 DIAGNOSIS — G5620 Lesion of ulnar nerve, unspecified upper limb: Secondary | ICD-10-CM | POA: Insufficient documentation

## 2011-12-07 DIAGNOSIS — Z538 Procedure and treatment not carried out for other reasons: Secondary | ICD-10-CM | POA: Diagnosis not present

## 2011-12-07 HISTORY — DX: Shortness of breath: R06.02

## 2011-12-07 HISTORY — DX: Hypoxemia: R09.02

## 2011-12-07 HISTORY — DX: Unspecified osteoarthritis, unspecified site: M19.90

## 2011-12-07 HISTORY — DX: Unspecified glaucoma: H40.9

## 2011-12-07 HISTORY — DX: Adverse effect of unspecified anesthetic, initial encounter: T41.45XA

## 2011-12-07 HISTORY — DX: Essential (primary) hypertension: I10

## 2011-12-07 HISTORY — PX: ULNAR NERVE TRANSPOSITION: SHX2595

## 2011-12-07 HISTORY — DX: Other complications of anesthesia, initial encounter: T88.59XA

## 2011-12-07 SURGERY — ULNAR NERVE DECOMPRESSION/TRANSPOSITION
Anesthesia: Choice | Laterality: Right

## 2011-12-07 MED ORDER — CHLORHEXIDINE GLUCONATE 4 % EX LIQD: 60.0000 mL | Freq: Once | CUTANEOUS | Status: DC

## 2011-12-07 MED ORDER — LACTATED RINGERS IV SOLN: INTRAVENOUS | Status: DC

## 2011-12-07 SURGICAL SUPPLY — 41 items
BANDAGE ADHESIVE 1X3 (GAUZE/BANDAGES/DRESSINGS) IMPLANT
BANDAGE ELASTIC 4 VELCRO ST LF (GAUZE/BANDAGES/DRESSINGS) IMPLANT
BLADE MINI RND TIP GREEN BEAV (BLADE) IMPLANT
BLADE SURG 15 STRL LF DISP TIS (BLADE) ×1 IMPLANT
BLADE SURG 15 STRL SS (BLADE)
BNDG CMPR 9X4 STRL LF SNTH (GAUZE/BANDAGES/DRESSINGS)
BNDG ESMARK 4X9 LF (GAUZE/BANDAGES/DRESSINGS) IMPLANT
BRUSH SCRUB EZ PLAIN DRY (MISCELLANEOUS) ×1 IMPLANT
CLOTH BEACON ORANGE TIMEOUT ST (SAFETY) ×1 IMPLANT
CORDS BIPOLAR (ELECTRODE) ×1 IMPLANT
COVER MAYO STAND STRL (DRAPES) ×1 IMPLANT
COVER TABLE BACK 60X90 (DRAPES) ×1 IMPLANT
CUFF TOURNIQUET SINGLE 18IN (TOURNIQUET CUFF) IMPLANT
DECANTER SPIKE VIAL GLASS SM (MISCELLANEOUS) IMPLANT
DRAPE EXTREMITY T 121X128X90 (DRAPE) ×1 IMPLANT
DRAPE SURG 17X23 STRL (DRAPES) ×1 IMPLANT
DRSG TEGADERM 4X4.75 (GAUZE/BANDAGES/DRESSINGS) ×1 IMPLANT
GAUZE SPONGE 4X4 12PLY STRL LF (GAUZE/BANDAGES/DRESSINGS) ×1 IMPLANT
GLOVE BIOGEL M STRL SZ7.5 (GLOVE) ×1 IMPLANT
GLOVE ORTHO TXT STRL SZ7.5 (GLOVE) ×1 IMPLANT
GOWN PREVENTION PLUS XLARGE (GOWN DISPOSABLE) ×1 IMPLANT
GOWN PREVENTION PLUS XXLARGE (GOWN DISPOSABLE) ×2 IMPLANT
LOOP VESSEL MAXI BLUE (MISCELLANEOUS) IMPLANT
NEEDLE 27GAX1X1/2 (NEEDLE) IMPLANT
PACK BASIN DAY SURGERY FS (CUSTOM PROCEDURE TRAY) ×1 IMPLANT
PADDING CAST ABS 4INX4YD NS (CAST SUPPLIES)
PADDING CAST ABS COTTON 4X4 ST (CAST SUPPLIES) ×1 IMPLANT
SLEEVE SCD COMPRESS KNEE MED (MISCELLANEOUS) IMPLANT
SPONGE GAUZE 4X4 12PLY (GAUZE/BANDAGES/DRESSINGS) ×1 IMPLANT
STOCKINETTE 4X48 STRL (DRAPES) ×1 IMPLANT
STRIP CLOSURE SKIN 1/2X4 (GAUZE/BANDAGES/DRESSINGS) ×1 IMPLANT
SUT PROLENE 3 0 PS 2 (SUTURE) ×1 IMPLANT
SUT VIC AB 3-0 X1 27 (SUTURE) IMPLANT
SUT VIC AB 4-0 P-3 18XBRD (SUTURE) IMPLANT
SUT VIC AB 4-0 P3 18 (SUTURE)
SYR 3ML 23GX1 SAFETY (SYRINGE) IMPLANT
SYR BULB 3OZ (MISCELLANEOUS) IMPLANT
SYR CONTROL 10ML LL (SYRINGE) IMPLANT
TOWEL OR 17X24 6PK STRL BLUE (TOWEL DISPOSABLE) ×2 IMPLANT
UNDERPAD 30X30 INCONTINENT (UNDERPADS AND DIAPERS) ×1 IMPLANT
WATER STERILE IRR 1000ML POUR (IV SOLUTION) ×1 IMPLANT

## 2011-12-07 NOTE — Progress Notes (Signed)
Patient had been counseled during preop phone call and as per surgeon at his office to have secured 24 hour post op care for day of surgery.

## 2011-12-07 NOTE — Anesthesia Preprocedure Evaluation (Deleted)
Anesthesia Evaluation  Patient identified by MRN, date of birth, ID band Patient awake    Reviewed: Allergy & Precautions, H&P , NPO status , Patient's Chart, lab work & pertinent test results  Airway Mallampati: II  Neck ROM: full    Dental   Pulmonary COPDformer smoker,          Cardiovascular hypertension,     Neuro/Psych  Neuromuscular disease    GI/Hepatic hiatal hernia, GERD-  ,IBS   Endo/Other  Type 2  Renal/GU      Musculoskeletal  (+) Arthritis -,   Abdominal   Peds  Hematology   Anesthesia Other Findings   Reproductive/Obstetrics                           Anesthesia Physical Anesthesia Plan  ASA: III  Anesthesia Plan: General   Post-op Pain Management:    Induction: Intravenous  Airway Management Planned: LMA  Additional Equipment:   Intra-op Plan:   Post-operative Plan:   Informed Consent: I have reviewed the patients History and Physical, chart, labs and discussed the procedure including the risks, benefits and alternatives for the proposed anesthesia with the patient or authorized representative who has indicated his/her understanding and acceptance.     Plan Discussed with: CRNA and Surgeon  Anesthesia Plan Comments:         Anesthesia Quick Evaluation

## 2011-12-07 NOTE — Progress Notes (Signed)
During preop assessment patient admitted that she did not have required 24 hour post surgical care for home.  RCC bed was offered which patient refused.  Dr. Daylene Katayama and Dr. Meredith Mody notified and case cancelled.

## 2011-12-08 NOTE — H&P (Addendum)
NAMEMARCHE, HOTTENSTEIN              ACCOUNT NO.:  [id number]  MEDICAL RECORD NO.:  [id number]  LOCATION:                                 FACILITY:  PHYSICIAN:  Youlanda Mighty. Alia Parsley, M.D. DATE OF BIRTH:  [birth year]  DATE OF ADMISSION: DATE OF DISCHARGE:                   SURGERY CANCELLATION DOCUMENTARY NOTES   Annaliz Aven is a 76 year old woman who is an established patient with our practice.  During the past four months, we have been evaluating her for symptoms affecting her right forearm and small finger compatible with ulnar entrapment neuropathy.  In the past, we have treated Ms. Magowan for entrapment neuropathy of the right median nerve at the carpal tunnel, with surgery under IV regional technique in 2004.  Since 2004, Ms. Garmany had developed a number of very significant medical problems including severe chronic obstructive airways disease, and poorly controlled diabetes.  She was under the long- term care of Dr. [name] for  diabetes and other medical predicaments.  She sees Dr. Baird Lyons for her COPD/chronic bronchitis predicament.  She is on ambulatory oxygen.  Ms. Iglesias lives alone with her cat.  We identified that she had significant ulnar entrapment neuropathy at the right elbow and had had detailed informed consent with her about the possibility of decompressing her ulnar nerve at the cubital tunnel, and/or transposing the nerve.  After a lengthy preoperative evaluation which included a detailed anesthesia consult with Dr. Albertina Parr, the Director of the [facility].  We made an arrangement for Ms Napp to have surgery under local anesthesia and sedation, i.e., monitored anesthesia care.  Detailed informed consent was completed at our office on November 22, 2011, at which time, we advised her that she would have her surgery performed under light sedation, local anesthesia and tourniquet hemostasis.  We asked Ms. Fjeld to sign a preop check list form  on November 22, 2011, so that it would be clear to her what the parameters of our contract were.  Mrs. Weightman came to the [facility] this morning with a paid attendant and reported that she did not have secure plans for a responsible adult to spend 24 hours with her over night at home following sedation.  The holding area nursing staff of the surgical center as well as the attending anesthesiologist informed her that this would not be an acceptable state of affairs.  She was reminded that the rule book for outpatient surgery at [facility] was that individuals who have had anesthesia sedation need a responsible adult to accompany them for 24 hours, postop.  An alternative would be to have her stay overnight in the recovery care facility; however, at times, this creates an additional financial burden for patients.  When I came to the surgical facility this morning, I was confronteded by the nurses as well as the attending anesthesiologist, who asked me to conference with Ms Blecha and to point out the parameters of her predicament.  She had a paid attendent with her and reported that she intended to go home after surgery and wanted to have her surgery performed under straight local anesthesia.  I advised her, as her previous attending surgeon, that in my judgment, it would be medically inappropriate  to attempt the surgery under local anesthesia with a tourniquet due to her challenges with pain from the tourniquet and difficulty to remain still with a tourniquet compressing on her arm for 45 minutes to an hour and a half depending on which procedure was required.  We then embarked on a nearly 1 hour dialogue during which we tried to make arrangements for her to have a responsible party with her for 24 hours at home versus admitting her to the recovery care center at the [facility] where she would be observed by registered nurses overnight with monitoring of her  oxygen saturation and general well being.  Mrs. Scheel reported that she did not want to spend the night, and did not want to have an individual come over to help care for her cat at home.  I spoke to the director of the surgical facility and confirmed that Mrs. Barcelo more likely would not be faced with any financial penalty if she spent the night in that she had Medicare and Medicaid coverage.  The staff of the surgical center reported that they were willing to observe her overnight, and would provide the appropriate codes for proper billing to Medicare and Medicaid.  I then had a second lengthy discussion with Ms. Deremer and her attendant about the safety issues involved in outpatient surgery with sedation particularly when individuals are oxygen dependent.  There are alternatives they could be considered which include use of local anesthesia and epinephrine, however, with her background history of cardiopulmonary disease, I was not comfortable providing epinephrine for hemostasis.  During our conversation, Ms. Lurry became increasingly agitated and was complaining that she had been wasting time at the surgical center and would have to pay her attendant who had brought her.  I pointed out very calmly to her, with witnesses, including Tim Blocker, CRNA, and [employer], CRNA, that she had been counseled at my office and had signed a preoperative check list on November 22, 2011, acknowledging the need for a 24-hour attendant/observer/family member to be with her.  She stated that "if she did not have her surgery done today, she would never come back", which I felt was simply an unreasonable comment on her part.  I pointed out that we would try to make every possible accommodation for her including keeping her overnight for safe observation versus helping her find a family member or other attendant who could be with her 24 hours.  She complained that she had wasted money with the  attendant this morning. After engaging in this debate for an extended period of time, it seemed to me to be the only reasonable course at this point, as she was unwilling to participate with the [place], as well as with reasonable medical recommendations, to cancel her surgery at this time.  I made a personal choice to make a charitable contribution of 40 dollars cash to her attendant so that she would not have any financial stress from this morning's encounter and prolonged conferencing.  At this point, Mrs. Enrico has been canceled.  Given her myriad medical problems and her choice not to follow the rule book and her behavior when offered a series of reasonable choices it is not at all clear how we will proceed forward in the future in an effort to safely accomplish her procedure.  It may be in her best interests to be referred to a major medical center where she can be admitted and observed 24 hours a  day by residents.  Note:  Witnesses Insurance claims handler, Immunologist; Mitzi Pamella Pert, CRNA; and Albertha Ghee MD, attending anesthesiologist.     Youlanda Mighty. Shlomo Seres, M.D.     RVS/MEDQ  D:  12/07/2011  T:  12/08/2011  Job:  [id number]

## 2011-12-12 ENCOUNTER — Encounter (HOSPITAL_BASED_OUTPATIENT_CLINIC_OR_DEPARTMENT_OTHER): Payer: Self-pay | Admitting: Orthopedic Surgery

## 2011-12-18 ENCOUNTER — Telehealth: Payer: Self-pay | Admitting: Internal Medicine

## 2011-12-18 NOTE — Telephone Encounter (Signed)
Spoke with pt and she is aware, states she may try some other OTC meds like Beano or phazyme. Pt states she is a diabetic and has to eat carbs with the protein for her blood sugar. Pt does not want [place] to send her "another diet to follow."

## 2011-12-18 NOTE — Telephone Encounter (Signed)
We have tried different things. She can try Creon or substitute with each meal. Booklet on "gas". LOW CARB DIET!!, curb all her cars ( break, cakes cookies etc)

## 2011-12-18 NOTE — Telephone Encounter (Signed)
Patient calling to report she is having a lot of gas and [employer] Ex does not help. She is embarrassed because she such bad gas. She also reports she still has diarrhea about every other day. She takes Bentyl and Questran. She is asking for some suggestion for gas. Please, advise.

## 2011-12-20 DIAGNOSIS — G5620 Lesion of ulnar nerve, unspecified upper limb: Secondary | ICD-10-CM | POA: Diagnosis not present

## 2011-12-20 DIAGNOSIS — M25529 Pain in unspecified elbow: Secondary | ICD-10-CM | POA: Diagnosis not present

## 2011-12-28 ENCOUNTER — Ambulatory Visit (INDEPENDENT_AMBULATORY_CARE_PROVIDER_SITE_OTHER): Payer: Medicare Other | Admitting: Internal Medicine

## 2011-12-28 ENCOUNTER — Encounter: Payer: Self-pay | Admitting: Internal Medicine

## 2011-12-28 VITALS — BP 138/68 | HR 76 | Ht 64.0 in | Wt 197.4 lb

## 2011-12-28 DIAGNOSIS — R079 Chest pain, unspecified: Secondary | ICD-10-CM

## 2011-12-28 DIAGNOSIS — J449 Chronic obstructive pulmonary disease, unspecified: Secondary | ICD-10-CM

## 2011-12-28 DIAGNOSIS — J4489 Other specified chronic obstructive pulmonary disease: Secondary | ICD-10-CM

## 2011-12-28 MED ORDER — PROMETHAZINE-CODEINE 6.25-10 MG/5ML PO SYRP: 5.0000 mL | ORAL_SOLUTION | Freq: Four times a day (QID) | ORAL | Status: DC | PRN

## 2011-12-28 NOTE — Progress Notes (Signed)
Patient ID: [name], female    DOB: 1935-12-21, 76 y.o.   MRN: [id number]  HPI SATURATION QUALIFICATIONS: Patient Saturations on Room Air while Ambulating = 84% Patient Saturations on 2.5 Liters of oxygen while Ambulating = 92%[name],CMA  09/28/10-  75 yoF former smoker with COPD, complicated by chronic rhinitis, GERD. Last here 04/29/10- note reviewed. Acute visit.-- Woke with soreness across under her breasts. Losing voice. Coughing spells, with much liquid coming up and from nose. Prior to that hadn't been coughing. C/o weak, dizzy, light headed- relieved by her oxygen. . Moving into a smaller apartment. Denies fever. Easy DOE short walks room to room.   10/31/10- 11 yoF former smoker with COPD, complicated by chronic rhinitis, GERD. She denies major change since last here. Has to stay in to avoid heat and on her oxygen much of the time in the house. Coughs scant clear or lemon yellow, with some hoarseness which comes and goes. She describes hoarseness as worse with phone conversations, variable but rarely if ever clear. She had seen Dr [name] years ago for epistaxis. Easy choke with eating. Discussed aspiration precautions, chin tuck.   02/26/11- 22 yoF former smoker with COPD, complicated by chronic rhinitis, GERD. Has had flu vaccine. No coughing through the summer. We first cool days as she stepped outside she would begin to cough again. Dry hacking cough. Very aware of active reflux anytime she bends over, regurgitating recently swallowed food. Has also had some diarrhea. Complains of spasms intermittently left lower anterior chest and left upper quadrant. It may all be GI. She thinks it is part of her diabetic neuropathy. Has appointment soon with Dr.Brodie.  08/16/11-75 yoF former smoker with COPD, complicated by chronic rhinitis, GERD. During the winter had prolonged diarrhea evaluated by Dr [name] and apparently attributed to her diabetic neuropathy. May have had  esophagitis-was told to take Magic mouthwash. Gradually easier dyspnea on exertion. Now on oxygen 2-3 L most of the time. She came today with acute complaint of aching pain intermittently for the past several weeks, dull. Associated with mid back at strap level. Pain is gone right now. She saw no effect of activity or position and said pain was nonpleuritic or radiating.  CXR- 08/16/11- reviewed w/ her. NAD with no acute process.   08/28/11-75 yoF former smoker with COPD, complicated by chronic rhinitis, GERD,  DM. Back pain comes and goes. She implies that it is better today and says that she really has been present a long time as part of her degenerative back pain. Complains of feeling sluggish and tired. Recent bronchitis with productive green sputum watery nose no fever. Using Atrovent. Intolerant of albuterol and Symbicort because of overstimulation. Home oxygen is 2-1/2-3 L. CXR 08/15/11-  IMPRESSION:  Hyperinflation.  No active cardiopulmonary disease.  Original Report Authenticated By: [name], M.D.   12/28/11- 7 yoF former smoker with COPD, complicated by chronic rhinitis, GERD,  DM. Not doing well-stays on O2 all the time; since being on Spiriva at first did great but has noticed having to stop more and rest during the day(nap) She  stays indoors most of the time without many friends to do things with. She often has self-limited sharp twinges right anterior chest wall and left infrascapular back. With her known degenerative disc disease, we discussed the possibility this was nerve root irritation. Spiriva helps variable cough.  Review of Systems-see HPI Constitutional:   No-   weight loss, night sweats, fevers, chills,  +  fatigue, lassitude. HEENT:   No-  headaches, difficulty swallowing, tooth/dental problems, sore throat,       No-  sneezing, itching, ear ache, nasal congestion, post nasal drip,  CV:  + Atypical chest pains, no-orthopnea, PND, swelling in lower extremities,  anasarca, dizziness, palpitations Resp: + shortness of breath with exertion or at rest.              No-   productive cough,  +t non-productive cough,  No- coughing up of blood.              No-   change in color of mucus.  No- wheezing.   Skin: No-   rash or lesions. GI:  No-   heartburn, indigestion,   +abdominal pain, nausea, vomiting,   +diarrhea,  GU:  MS:  No-   joint pain or swelling.    + back pain. Neuro-     nothing unusual Psych:  No- change in mood or affect. + depression or anxiety.  No memory loss.    Objective:   Physical Exam BP 138/68  Pulse 76  Ht 5' 4"  (1.626 m)  Wt 197 lb 6.4 oz (89.54 kg)  BMI 33.88 kg/m2  SpO2 96% General- Alert, Oriented, Affect-animated, cheerful, Distress- none acute. Talkative.  on O2 2.5 L Skin- rash-none, lesions- none, excoriation- none Lymphadenopathy- none Head- atraumatic            Eyes- Gross vision intact, PERRLA, conjunctivae clear secretions            Ears- Hearing, canals-normal            Nose- Clear, no-Septal dev, mucus, polyps, erosion, perforation             Throat- Mallampati II , mucosa clear , drainage- none, tonsils- atrophic Neck- flexible , trachea midline, no stridor , thyroid nl, carotid no bruit Chest - symmetrical excursion , unlabored           Heart/CV- RRR , no murmur , no gallop  , no rub, nl s1 s2                           - JVD- none , edema- none, stasis changes- none, varices- none           Lung- diminished, clear, wheeze- none, cough- none , dullness-none, rub- none           Chest wall- back is not tender Abd-  Br/ Gen/ Rectal- Not done, not indicated Extrem- cyanosis- none, clubbing, none, atrophy- none, strength- nl Neuro- grossly intact to observation

## 2011-12-28 NOTE — Patient Instructions (Addendum)
Order - schedule PFT dx COPD  Script refill cough syrup

## 2012-01-02 DIAGNOSIS — IMO0002 Reserved for concepts with insufficient information to code with codable children: Secondary | ICD-10-CM | POA: Diagnosis not present

## 2012-01-02 DIAGNOSIS — E785 Hyperlipidemia, unspecified: Secondary | ICD-10-CM | POA: Diagnosis not present

## 2012-01-02 DIAGNOSIS — E559 Vitamin D deficiency, unspecified: Secondary | ICD-10-CM | POA: Diagnosis not present

## 2012-01-02 DIAGNOSIS — IMO0001 Reserved for inherently not codable concepts without codable children: Secondary | ICD-10-CM | POA: Diagnosis not present

## 2012-01-02 DIAGNOSIS — I10 Essential (primary) hypertension: Secondary | ICD-10-CM | POA: Diagnosis not present

## 2012-01-03 NOTE — Assessment & Plan Note (Signed)
Musculoskeletal chest wall pain most likely from nerve root irritation. She does have a diabetic neuropathy and digestive motility problems attributed to her diabetes so these pains might be related as well

## 2012-01-03 NOTE — Assessment & Plan Note (Signed)
She remains oxygen dependent and feels limited by shortness of breath. It is time to update objective documentation. Plan-PFT

## 2012-01-16 ENCOUNTER — Telehealth: Payer: Self-pay | Admitting: Internal Medicine

## 2012-01-16 ENCOUNTER — Ambulatory Visit (INDEPENDENT_AMBULATORY_CARE_PROVIDER_SITE_OTHER): Payer: Medicare Other | Admitting: Internal Medicine

## 2012-01-16 DIAGNOSIS — J449 Chronic obstructive pulmonary disease, unspecified: Secondary | ICD-10-CM

## 2012-01-16 DIAGNOSIS — R059 Cough, unspecified: Secondary | ICD-10-CM

## 2012-01-16 DIAGNOSIS — R05 Cough: Secondary | ICD-10-CM | POA: Diagnosis not present

## 2012-01-16 LAB — PULMONARY FUNCTION TEST

## 2012-01-16 NOTE — Telephone Encounter (Signed)
Griselda Miner- do you know if he has signed PFT yet- if so please bring to triage so that we can fax this for the pt. Thanks!

## 2012-01-16 NOTE — Telephone Encounter (Signed)
PFT just done today 01-16-12; will send to CY to make him aware that we need PFT to fax for patient.

## 2012-01-16 NOTE — Progress Notes (Signed)
PFT done today. 

## 2012-01-17 NOTE — Telephone Encounter (Signed)
Please let patient know that CY has reviewed and we faxed to [facility] as requested. Results sent to HIM to scan in EPIC.

## 2012-01-17 NOTE — Telephone Encounter (Signed)
ATC pt line busy. No other alternate # in chart

## 2012-01-18 NOTE — Telephone Encounter (Signed)
Severe Obstructive airways disease, insignificant response to bronchodilator, air trapping-increased residual volume, diffusion moderately reduced; can discuss in more detail at next OV.

## 2012-01-18 NOTE — Telephone Encounter (Signed)
Spoke with pt and notified of results per CDY. Pt verbalized understanding and will keep pending ov with CDY in Dec.

## 2012-01-18 NOTE — Telephone Encounter (Signed)
Called, spoke with pt.  Informed her PFT results have been faxed to [facility] as requested.  She verbalized understanding of this.  Would like to know the results.  Do not see these scanned into chart yet.  Dr. Annamaria Boots, pls advise.  Thank you.

## 2012-01-22 DIAGNOSIS — G5620 Lesion of ulnar nerve, unspecified upper limb: Secondary | ICD-10-CM | POA: Diagnosis not present

## 2012-01-24 ENCOUNTER — Telehealth: Payer: Self-pay | Admitting: Internal Medicine

## 2012-01-24 NOTE — Telephone Encounter (Signed)
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  Pt is in extreme pain form this but Dr Amedeo Plenty does not know if surgery will even help.  Dr Amedeo Plenty told pt that he will not operate unless he gets approval from Dr Annamaria Boots.  Pt wants Dr Janee Morn opinion on whether she should have surgery from a lung standpoint.  Please advise

## 2012-01-25 ENCOUNTER — Encounter: Payer: Self-pay | Admitting: Internal Medicine

## 2012-01-25 NOTE — Telephone Encounter (Signed)
Pt states she is needing ortho surgery to release a nerve in her right arm from the bone. Dr. Amedeo Plenty says that surgery is the only thing that will help this but isn't sure if she can be put to sleep because of her PFt results. Dr.Young, pls advise.   Last OV 12/28/11.

## 2012-01-25 NOTE — Telephone Encounter (Signed)
Patient calling back again

## 2012-01-26 NOTE — Telephone Encounter (Signed)
[  t states she would like to speak with Dr.Young regarding her PFT results because she has questions. Pt states she has been waiting for several days regarding this matter and really needs to speak with him. Pt aware CDY is out of the office today but we will send the msg to him and ask that he call her back next week. Pt verbalized understanding.

## 2012-01-26 NOTE — Telephone Encounter (Signed)
Pt wants to speak to nurse "today". She understands that dr young is out of office today but wants recs asap as she is in pain and can't proceed without hearing back. Mariann Laster

## 2012-01-30 ENCOUNTER — Telehealth: Payer: Self-pay | Admitting: Internal Medicine

## 2012-01-30 NOTE — Telephone Encounter (Signed)
Pt has called back & wants to know why CY hasn't gotten back with her.  Pt requests to have a returned call today, please.  [name]

## 2012-01-30 NOTE — Telephone Encounter (Signed)
I reviewed her PFT- severe emphysema Dr Trenda Moots is considering elbow surgery.I discussed these issues with her, and have left message to discuss w/ Dr Amedeo Plenty

## 2012-01-31 NOTE — Telephone Encounter (Signed)
CY spoke with patient and MD about this matter.

## 2012-02-02 ENCOUNTER — Telehealth: Payer: Self-pay | Admitting: Internal Medicine

## 2012-02-02 NOTE — Telephone Encounter (Signed)
I did speak directly with Dr Amedeo Plenty. He was planning to talk more with her. She would be at some increased risk with surgery because of her COPD, but I think she could get through it, especially with a regional block as my first choice, rather than general anesthesia.

## 2012-02-02 NOTE — Telephone Encounter (Signed)
Pt aware and she will contact Dr. Amedeo Plenty for more information.[place] Bing, CMA

## 2012-02-02 NOTE — Telephone Encounter (Signed)
I spoke with pt and she is calling wanting to know if Dr. Annamaria Boots has heard back from Dr. Amedeo Plenty yet.S he stated she is in a lot pain. Also c/o hacking cough w/ clear phlem. She has been taking phenergan w/ codeine cough syrup at night. Pt wanted to know what OTC she could take. I advised her delsym OTC per the bottle. She voiced her understanding. Please advise if you have heard back from Dr. Amedeo Plenty Dr. Annamaria Boots thanks

## 2012-02-16 ENCOUNTER — Other Ambulatory Visit: Payer: Self-pay | Admitting: Orthopedic Surgery

## 2012-02-19 ENCOUNTER — Other Ambulatory Visit: Payer: Self-pay | Admitting: Orthopedic Surgery

## 2012-02-21 ENCOUNTER — Ambulatory Visit (INDEPENDENT_AMBULATORY_CARE_PROVIDER_SITE_OTHER): Payer: Medicare Other

## 2012-02-21 DIAGNOSIS — Z23 Encounter for immunization: Secondary | ICD-10-CM

## 2012-02-22 DIAGNOSIS — Z23 Encounter for immunization: Secondary | ICD-10-CM

## 2012-03-01 ENCOUNTER — Encounter (HOSPITAL_BASED_OUTPATIENT_CLINIC_OR_DEPARTMENT_OTHER): Payer: Self-pay | Admitting: *Deleted

## 2012-03-01 NOTE — Progress Notes (Signed)
Pt was to have surgery 8/13 here with dr sypher-cancelled due to not having someone to stay with her-she is severe COPD O2 dependent. She states dr Amedeo Plenty has told her she would stay overnight here-to bring all meds and inhalers and overnight bag-she does have someone to come take her home in am, Will need istat

## 2012-03-07 ENCOUNTER — Encounter (HOSPITAL_BASED_OUTPATIENT_CLINIC_OR_DEPARTMENT_OTHER): Payer: Self-pay | Admitting: Anesthesiology

## 2012-03-07 ENCOUNTER — Encounter (HOSPITAL_BASED_OUTPATIENT_CLINIC_OR_DEPARTMENT_OTHER): Payer: Self-pay

## 2012-03-07 ENCOUNTER — Ambulatory Visit (HOSPITAL_BASED_OUTPATIENT_CLINIC_OR_DEPARTMENT_OTHER): Payer: Medicare Other | Admitting: Anesthesiology

## 2012-03-07 ENCOUNTER — Encounter (HOSPITAL_BASED_OUTPATIENT_CLINIC_OR_DEPARTMENT_OTHER)
Admission: RE | Disposition: A | Discharge status: Home / Self Care | Payer: Self-pay | Source: Ambulatory Visit | Attending: Orthopedic Surgery

## 2012-03-07 ENCOUNTER — Ambulatory Visit (HOSPITAL_BASED_OUTPATIENT_CLINIC_OR_DEPARTMENT_OTHER)
Admission: RE | Admit: 2012-03-07 | Discharge: 2012-03-08 | Disposition: A | Discharge status: Home / Self Care | Payer: Medicare Other | Source: Ambulatory Visit | Attending: Orthopedic Surgery | Admitting: Orthopedic Surgery

## 2012-03-07 DIAGNOSIS — K219 Gastro-esophageal reflux disease without esophagitis: Secondary | ICD-10-CM | POA: Diagnosis not present

## 2012-03-07 DIAGNOSIS — K449 Diaphragmatic hernia without obstruction or gangrene: Secondary | ICD-10-CM | POA: Diagnosis not present

## 2012-03-07 DIAGNOSIS — K5730 Diverticulosis of large intestine without perforation or abscess without bleeding: Secondary | ICD-10-CM | POA: Diagnosis not present

## 2012-03-07 DIAGNOSIS — E119 Type 2 diabetes mellitus without complications: Secondary | ICD-10-CM | POA: Insufficient documentation

## 2012-03-07 DIAGNOSIS — Z01812 Encounter for preprocedural laboratory examination: Secondary | ICD-10-CM | POA: Insufficient documentation

## 2012-03-07 DIAGNOSIS — I10 Essential (primary) hypertension: Secondary | ICD-10-CM | POA: Diagnosis not present

## 2012-03-07 DIAGNOSIS — G5600 Carpal tunnel syndrome, unspecified upper limb: Secondary | ICD-10-CM | POA: Insufficient documentation

## 2012-03-07 DIAGNOSIS — Z794 Long term (current) use of insulin: Secondary | ICD-10-CM | POA: Diagnosis not present

## 2012-03-07 DIAGNOSIS — Z7982 Long term (current) use of aspirin: Secondary | ICD-10-CM | POA: Diagnosis not present

## 2012-03-07 DIAGNOSIS — K7689 Other specified diseases of liver: Secondary | ICD-10-CM | POA: Insufficient documentation

## 2012-03-07 DIAGNOSIS — M129 Arthropathy, unspecified: Secondary | ICD-10-CM | POA: Diagnosis not present

## 2012-03-07 DIAGNOSIS — J4489 Other specified chronic obstructive pulmonary disease: Secondary | ICD-10-CM | POA: Insufficient documentation

## 2012-03-07 DIAGNOSIS — K589 Irritable bowel syndrome without diarrhea: Secondary | ICD-10-CM | POA: Insufficient documentation

## 2012-03-07 DIAGNOSIS — H409 Unspecified glaucoma: Secondary | ICD-10-CM | POA: Diagnosis not present

## 2012-03-07 DIAGNOSIS — J449 Chronic obstructive pulmonary disease, unspecified: Secondary | ICD-10-CM | POA: Diagnosis not present

## 2012-03-07 DIAGNOSIS — G5620 Lesion of ulnar nerve, unspecified upper limb: Secondary | ICD-10-CM | POA: Diagnosis not present

## 2012-03-07 DIAGNOSIS — Z79899 Other long term (current) drug therapy: Secondary | ICD-10-CM | POA: Insufficient documentation

## 2012-03-07 HISTORY — PX: ULNAR TUNNEL RELEASE: SHX820

## 2012-03-07 LAB — POCT I-STAT, CHEM 8
BUN: 10 mg/dL (ref 6–23)
Calcium, Ion: 1.18 mmol/L (ref 1.13–1.30)
HCT: 44 % (ref 36.0–46.0)
Hemoglobin: 15 g/dL (ref 12.0–15.0)
Sodium: 137 mEq/L (ref 135–145)
TCO2: 29 mmol/L (ref 0–100)

## 2012-03-07 LAB — GLUCOSE, CAPILLARY: Glucose-Capillary: 134 mg/dL — ABNORMAL HIGH (ref 70–99)

## 2012-03-07 SURGERY — RELEASE, CUBITAL TUNNEL
Anesthesia: Choice | Site: Elbow | Laterality: Right | Wound class: Clean

## 2012-03-07 MED ORDER — METOPROLOL SUCCINATE ER 25 MG PO TB24: 25.0000 mg | ORAL_TABLET | Freq: Every day | ORAL | Status: DC

## 2012-03-07 MED ORDER — OXYCODONE-ACETAMINOPHEN 5-325 MG PO TABS: 1.0000 | ORAL_TABLET | ORAL | Status: DC | PRN

## 2012-03-07 MED ORDER — CEFAZOLIN SODIUM 1-5 GM-% IV SOLN
1.0000 g | INTRAVENOUS | Status: AC
  Administered 2012-03-08: 1 g via INTRAVENOUS

## 2012-03-07 MED ORDER — TIOTROPIUM BROMIDE MONOHYDRATE 18 MCG IN CAPS: 18.0000 ug | ORAL_CAPSULE | Freq: Every day | RESPIRATORY_TRACT | Status: DC

## 2012-03-07 MED ORDER — ASPIRIN 325 MG PO TABS: 325.0000 mg | ORAL_TABLET | Freq: Every day | ORAL | Status: DC

## 2012-03-07 MED ORDER — IRBESARTAN 150 MG PO TABS: 150.0000 mg | ORAL_TABLET | Freq: Every day | ORAL | Status: DC

## 2012-03-07 MED ORDER — LIDOCAINE HCL (CARDIAC) 20 MG/ML IV SOLN
INTRAVENOUS | Status: DC | PRN
  Administered 2012-03-07: 50 mg via INTRAVENOUS

## 2012-03-07 MED ORDER — PROMETHAZINE HCL 25 MG/ML IJ SOLN
6.2500 mg | Freq: Once | INTRAMUSCULAR | Status: AC
  Administered 2012-03-07: 6.25 mg via INTRAVENOUS

## 2012-03-07 MED ORDER — IPRATROPIUM BROMIDE HFA 17 MCG/ACT IN AERS: 2.0000 | INHALATION_SPRAY | Freq: Four times a day (QID) | RESPIRATORY_TRACT | Status: DC

## 2012-03-07 MED ORDER — CEFAZOLIN SODIUM 1-5 GM-% IV SOLN
1.0000 g | Freq: Three times a day (TID) | INTRAVENOUS | Status: DC
  Administered 2012-03-08: 1 g via INTRAVENOUS

## 2012-03-07 MED ORDER — SODIUM CHLORIDE 0.45 % IV SOLN
INTRAVENOUS | Status: DC
  Administered 2012-03-07: 18:00:00 via INTRAVENOUS

## 2012-03-07 MED ORDER — BUPIVACAINE HCL (PF) 0.5 % IJ SOLN
INTRAMUSCULAR | Status: DC | PRN
  Administered 2012-03-07: 10 mL

## 2012-03-07 MED ORDER — CEFAZOLIN SODIUM-DEXTROSE 2-3 GM-% IV SOLR
2.0000 g | INTRAVENOUS | Status: AC
  Administered 2012-03-07: 2 g via INTRAVENOUS

## 2012-03-07 MED ORDER — BUPIVACAINE-EPINEPHRINE PF 0.5-1:200000 % IJ SOLN
INTRAMUSCULAR | Status: DC | PRN
  Administered 2012-03-07: 30 mL

## 2012-03-07 MED ORDER — ONDANSETRON HCL 4 MG/2ML IJ SOLN
4.0000 mg | Freq: Four times a day (QID) | INTRAMUSCULAR | Status: DC | PRN
  Administered 2012-03-07: 4 mg via INTRAVENOUS

## 2012-03-07 MED ORDER — ONDANSETRON HCL 4 MG PO TABS: 4.0000 mg | ORAL_TABLET | Freq: Four times a day (QID) | ORAL | Status: DC | PRN

## 2012-03-07 MED ORDER — FENTANYL CITRATE 0.05 MG/ML IJ SOLN: 25.0000 ug | INTRAMUSCULAR | Status: DC | PRN

## 2012-03-07 MED ORDER — CHLORHEXIDINE GLUCONATE 4 % EX LIQD: 60.0000 mL | Freq: Once | CUTANEOUS | Status: DC

## 2012-03-07 MED ORDER — DIPHENHYDRAMINE HCL 25 MG PO CAPS: 25.0000 mg | ORAL_CAPSULE | Freq: Four times a day (QID) | ORAL | Status: DC | PRN

## 2012-03-07 MED ORDER — IPRATROPIUM BROMIDE 0.02 % IN SOLN: 500.0000 ug | Freq: Four times a day (QID) | RESPIRATORY_TRACT | Status: DC

## 2012-03-07 MED ORDER — METHOCARBAMOL 100 MG/ML IJ SOLN: 500.0000 mg | Freq: Four times a day (QID) | INTRAVENOUS | Status: DC | PRN

## 2012-03-07 MED ORDER — INSULIN ASPART 100 UNIT/ML ~~LOC~~ SOLN
0.0000 [IU] | Freq: Three times a day (TID) | SUBCUTANEOUS | Status: DC
  Administered 2012-03-08: 8 [IU] via SUBCUTANEOUS
  Administered 2012-03-08: 3 [IU] via SUBCUTANEOUS

## 2012-03-07 MED ORDER — MIDAZOLAM HCL 2 MG/2ML IJ SOLN
0.5000 mg | INTRAMUSCULAR | Status: DC | PRN
  Administered 2012-03-07: 1 mg via INTRAVENOUS

## 2012-03-07 MED ORDER — PROPOFOL 10 MG/ML IV BOLUS
INTRAVENOUS | Status: DC | PRN
  Administered 2012-03-07 (×3): 20 mg via INTRAVENOUS

## 2012-03-07 MED ORDER — MORPHINE SULFATE 2 MG/ML IJ SOLN: 1.0000 mg | INTRAMUSCULAR | Status: DC | PRN

## 2012-03-07 MED ORDER — LACTATED RINGERS IV SOLN
INTRAVENOUS | Status: DC
  Administered 2012-03-07: 10 mL/h via INTRAVENOUS
  Administered 2012-03-07: 14:00:00 via INTRAVENOUS

## 2012-03-07 MED ORDER — ALPRAZOLAM 0.5 MG PO TABS: 0.5000 mg | ORAL_TABLET | Freq: Three times a day (TID) | ORAL | Status: DC | PRN

## 2012-03-07 MED ORDER — PANTOPRAZOLE SODIUM 40 MG PO TBEC: 80.0000 mg | DELAYED_RELEASE_TABLET | Freq: Every day | ORAL | Status: DC

## 2012-03-07 MED ORDER — ONDANSETRON HCL 4 MG/2ML IJ SOLN
INTRAMUSCULAR | Status: DC | PRN
  Administered 2012-03-07: 4 mg via INTRAVENOUS

## 2012-03-07 MED ORDER — ALPRAZOLAM 0.5 MG PO TABS: 0.5000 mg | ORAL_TABLET | Freq: Four times a day (QID) | ORAL | Status: DC | PRN

## 2012-03-07 MED ORDER — TIMOLOL MALEATE 0.5 % OP SOLN: 1.0000 [drp] | Freq: Two times a day (BID) | OPHTHALMIC | Status: DC

## 2012-03-07 MED ORDER — SENNA 8.6 MG PO TABS: 1.0000 | ORAL_TABLET | Freq: Two times a day (BID) | ORAL | Status: DC

## 2012-03-07 MED ORDER — SODIUM CHLORIDE 0.45 % IV SOLN: INTRAVENOUS | Status: DC

## 2012-03-07 MED ORDER — FENTANYL CITRATE 0.05 MG/ML IJ SOLN
50.0000 ug | INTRAMUSCULAR | Status: DC | PRN
  Administered 2012-03-07: 50 ug via INTRAVENOUS

## 2012-03-07 MED ORDER — METHOCARBAMOL 500 MG PO TABS: 500.0000 mg | ORAL_TABLET | Freq: Four times a day (QID) | ORAL | Status: DC | PRN

## 2012-03-07 SURGICAL SUPPLY — 70 items
BANDAGE CONFORM 3  STR LF (GAUZE/BANDAGES/DRESSINGS) ×1 IMPLANT
BANDAGE ELASTIC 3 VELCRO ST LF (GAUZE/BANDAGES/DRESSINGS) ×3 IMPLANT
BANDAGE ELASTIC 4 VELCRO ST LF (GAUZE/BANDAGES/DRESSINGS) IMPLANT
BANDAGE ELASTIC 6 VELCRO ST LF (GAUZE/BANDAGES/DRESSINGS) ×2 IMPLANT
BANDAGE GAUZE ELAST BULKY 4 IN (GAUZE/BANDAGES/DRESSINGS) ×2 IMPLANT
BLADE SURG 15 STRL LF DISP TIS (BLADE) ×3 IMPLANT
BLADE SURG 15 STRL SS (BLADE) ×4
BLADE SURG ROTATE 9660 (MISCELLANEOUS) IMPLANT
BRUSH SCRUB EZ PLAIN DRY (MISCELLANEOUS) ×2 IMPLANT
CANISTER SUCTION 1200CC (MISCELLANEOUS) ×2 IMPLANT
CLOTH BEACON ORANGE TIMEOUT ST (SAFETY) ×2 IMPLANT
CORDS BIPOLAR (ELECTRODE) ×2 IMPLANT
COVER MAYO STAND STRL (DRAPES) ×2 IMPLANT
COVER TABLE BACK 60X90 (DRAPES) ×2 IMPLANT
CUFF TOURNIQUET SINGLE 18IN (TOURNIQUET CUFF) ×1 IMPLANT
DECANTER SPIKE VIAL GLASS SM (MISCELLANEOUS) IMPLANT
DRAPE EXTREMITY T 121X128X90 (DRAPE) ×2 IMPLANT
DRAPE INCISE IOBAN 66X45 STRL (DRAPES) IMPLANT
DRAPE SURG 17X23 STRL (DRAPES) ×2 IMPLANT
DRSG EMULSION OIL 3X3 NADH (GAUZE/BANDAGES/DRESSINGS) ×2 IMPLANT
EVACUATOR 1/8 PVC DRAIN (DRAIN) IMPLANT
EVACUATOR 3/16  PVC DRAIN (DRAIN)
EVACUATOR 3/16 PVC DRAIN (DRAIN) IMPLANT
EVACUATOR SILICONE 100CC (DRAIN) IMPLANT
GAUZE SPONGE 4X4 16PLY XRAY LF (GAUZE/BANDAGES/DRESSINGS) IMPLANT
GAUZE XEROFORM 1X8 LF (GAUZE/BANDAGES/DRESSINGS) IMPLANT
GLOVE BIO SURGEON STRL SZ8 (GLOVE) ×2 IMPLANT
GLOVE BIOGEL M STRL SZ7.5 (GLOVE) ×2 IMPLANT
GLOVE ECLIPSE 6.5 STRL STRAW (GLOVE) ×1 IMPLANT
GLOVE SS BIOGEL STRL SZ 8 (GLOVE) ×2 IMPLANT
GLOVE SUPERSENSE BIOGEL SZ 8 (GLOVE) ×2
GOWN PREVENTION PLUS XLARGE (GOWN DISPOSABLE) ×2 IMPLANT
GOWN PREVENTION PLUS XXLARGE (GOWN DISPOSABLE) ×3 IMPLANT
LOOP VESSEL MAXI BLUE (MISCELLANEOUS) IMPLANT
NDL HYPO 25X1 1.5 SAFETY (NEEDLE) ×1 IMPLANT
NEEDLE HYPO 22GX1.5 SAFETY (NEEDLE) IMPLANT
NEEDLE HYPO 25X1 1.5 SAFETY (NEEDLE) IMPLANT
NS IRRIG 1000ML POUR BTL (IV SOLUTION) ×2 IMPLANT
PACK BASIN DAY SURGERY FS (CUSTOM PROCEDURE TRAY) ×2 IMPLANT
PAD CAST 3X4 CTTN HI CHSV (CAST SUPPLIES) ×2 IMPLANT
PADDING CAST ABS 4INX4YD NS (CAST SUPPLIES) ×2
PADDING CAST ABS COTTON 4X4 ST (CAST SUPPLIES) ×1 IMPLANT
PADDING CAST COTTON 3X4 STRL (CAST SUPPLIES) ×4
SHEET MEDIUM DRAPE 40X70 STRL (DRAPES) IMPLANT
SPLINT FAST PLASTER 5X30 (CAST SUPPLIES)
SPLINT PLASTER CAST FAST 5X30 (CAST SUPPLIES) IMPLANT
SPLINT PLASTER CAST XFAST 3X15 (CAST SUPPLIES) IMPLANT
SPLINT PLASTER XTRA FASTSET 3X (CAST SUPPLIES)
SPONGE GAUZE 4X4 12PLY (GAUZE/BANDAGES/DRESSINGS) ×2 IMPLANT
SPONGE LAP 4X18 X RAY DECT (DISPOSABLE) IMPLANT
STOCKINETTE 4X48 STRL (DRAPES) ×2 IMPLANT
STOCKINETTE SYNTHETIC 3 UNSTER (CAST SUPPLIES) ×2 IMPLANT
STRIP CLOSURE SKIN 1/2X4 (GAUZE/BANDAGES/DRESSINGS) IMPLANT
SUCTION FRAZIER TIP 10 FR DISP (SUCTIONS) ×1 IMPLANT
SUT BONE WAX W31G (SUTURE) IMPLANT
SUT FIBERWIRE 2-0 18 17.9 3/8 (SUTURE)
SUT PROLENE 4 0 PS 2 18 (SUTURE) ×4 IMPLANT
SUT VIC AB 3-0 FS2 27 (SUTURE) IMPLANT
SUT VIC AB 4-0 P-3 18XBRD (SUTURE) IMPLANT
SUT VIC AB 4-0 P3 18 (SUTURE)
SUTURE FIBERWR 2-0 18 17.9 3/8 (SUTURE) IMPLANT
SYR BULB 3OZ (MISCELLANEOUS) ×2 IMPLANT
SYR CONTROL 10ML LL (SYRINGE) ×2 IMPLANT
TAPE SURG TRANSPORE 1 IN (GAUZE/BANDAGES/DRESSINGS) ×1 IMPLANT
TAPE SURGICAL TRANSPORE 1 IN (GAUZE/BANDAGES/DRESSINGS) ×1
TOWEL OR 17X24 6PK STRL BLUE (TOWEL DISPOSABLE) ×4 IMPLANT
TOWEL OR NON WOVEN STRL DISP B (DISPOSABLE) ×2 IMPLANT
TUBE CONNECTING 20X1/4 (TUBING) ×2 IMPLANT
UNDERPAD 30X30 INCONTINENT (UNDERPADS AND DIAPERS) ×2 IMPLANT
WATER STERILE IRR 1000ML POUR (IV SOLUTION) ×2 IMPLANT

## 2012-03-07 NOTE — Op Note (Signed)
See Dictation # [id number] Roseanne Kaufman MD

## 2012-03-07 NOTE — Progress Notes (Signed)
Assisted Dr. Ola Spurr with right, ultrasound guided, supraclavicular block. Side rails up, monitors on throughout procedure. See vital signs in flow sheet. Tolerated Procedure well.

## 2012-03-07 NOTE — H&P (Signed)
[name] is an 76 y.o. female.   Chief Complaint:Right CUTS HPI: Patient presents for evaluation and treatment of the of their upper extremity predicament. The patient denies neck back chest or of abdominal pain. The patient notes that they have no lower extremity problems. The patient from primarily complains of the upper extremity pain noted.   Past Medical History  Diagnosis Date  . Irritable bowel syndrome   . Other chronic nonalcoholic liver disease   . Esophageal reflux   . Allergic rhinitis, cause unspecified     Sinus CT Rec 12-23-2009  . 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 Sciatica   . Shingles 2010  . Steatohepatitis   . Diabetes mellitus   . Diverticulosis   . Hiatal hernia   . Esophageal stricture   . Diarrhea   . GERD (gastroesophageal reflux disease)   . Shortness of breath   . Hypertension   . Arthritis   . Glaucoma   . Complication of anesthesia     takes along time to wake up  . Oxygen deficiency     Past Surgical History  Procedure Date  . Cholecystectomy open 1978  . Liver biopsy 08-1992  . Parathyroid exploration   . Tonsillectomy and adenoidectomy   . Total abdominal hysterectomy   . Esophagogastroduodenoscopy 7846,96-29    H Hernia,es.stricture s/p dil 28F  . Colonoscopy 07-2001    mild diverticulosis  . Carpal tunnel release     right hand  . Cataract extraction     Bilateral  . Ulnar nerve transposition 12/07/2011    Procedure: ULNAR NERVE DECOMPRESSION/TRANSPOSITION;this was cancelled-not done  Surgeon: Cammie Sickle., MD;  Location: [facility];  Service: Orthopedics;  Laterality: Right;  right ulnar nerve in situ decompression    Family History  Problem Relation Age of Onset  . Lung cancer Mother     small cell;Byssinosis  . Heart disease Father   . Lung cancer Sister   . Liver cancer Sister     ? mets from another area of the body  . Diabetes     grandmother  . Stroke Maternal Grandfather    Social History:  reports that she quit smoking about 23 years ago. Her smoking use included Cigarettes. She has never used smokeless tobacco. She reports that she does not drink alcohol or use illicit drugs.  Allergies:  Allergies  Allergen Reactions  . Clidinium-Chlordiazepoxide Other (See Comments)    Sleepy,weak  . Codeine     REACTION: gi upset  . Tramadol     Medications Prior to Admission  Medication Sig Dispense Refill  . acetaminophen (TYLENOL) 500 MG tablet Take 500 mg by mouth every 6 (six) hours as needed.        . ALPRAZolam (XANAX) 0.5 MG tablet Take 0.5 mg by mouth 3 (three) times daily as needed.        . Alum & Mag Hydroxide-Simeth (MAGIC MOUTHWASH) SOLN Take 10 mLs by mouth 4 (four) times daily.  150 mL  1  . aspirin 325 MG tablet Take 325 mg by mouth daily.        . candesartan (ATACAND) 32 MG tablet Take 16 mg daily      . cephALEXin (KEFLEX) 250 MG capsule Take 250 mg by mouth at bedtime.        . Cholecalciferol (VITAMIN D3) 1000 UNITS CAPS Take 2 capsules by mouth daily.        Marland Kitchen  cholestyramine (QUESTRAN) 4 G packet Take 1 hour apart from all other medication  30 each  2  . dicyclomine (BENTYL) 10 MG capsule Take 10 mg by mouth daily as needed.        . insulin glargine (LANTUS) 100 UNIT/ML injection 36 Units daily. Inject 36 units every morning and 29-32 units at night      . insulin lispro (HUMALOG KWIKPEN) 100 UNIT/ML injection Inject into the skin 3 (three) times daily before meals. Sliding scale       . ipratropium (ATROVENT HFA) 17 MCG/ACT inhaler Inhale 2 puffs into the lungs 4 (four) times daily.  1 Inhaler  11  . Loperamide HCl (IMODIUM PO) Take by mouth as needed.        . meclizine (ANTIVERT) 25 MG tablet Take 25 mg by mouth 3 (three) times daily as needed.      . metoprolol succinate (TOPROL-XL) 25 MG 24 hr tablet Take 25 mg by mouth daily.      . promethazine-codeine (PHENERGAN WITH CODEINE) 6.25-10  MG/5ML syrup Take 5 mLs by mouth every 6 (six) hours as needed for cough.  200 mL  0  . timolol (TIMOPTIC) 0.5 % ophthalmic solution Place 1 drop into the left eye 2 (two) times daily.       Marland Kitchen tiotropium (SPIRIVA HANDIHALER) 18 MCG inhalation capsule Place 1 capsule (18 mcg total) into inhaler and inhale daily.  30 capsule  prn  . ipratropium (ATROVENT) 0.02 % nebulizer solution Take 2.5 mLs (500 mcg total) by nebulization 4 (four) times daily.  120 mL  0  . omeprazole (PRILOSEC) 40 MG capsule Take 1 capsule (40 mg total) by mouth 2 (two) times daily.  60 capsule  1    Results for orders placed during the hospital encounter of 03/07/12 (from the past 48 hour(s))  POCT I-STAT, CHEM 8     Status: Abnormal   Collection Time   03/07/12  2:09 PM      Component Value Range Comment   Sodium 137  135 - 145 mEq/L    Potassium 4.0  3.5 - 5.1 mEq/L    Chloride 97  96 - 112 mEq/L    BUN 10  6 - 23 mg/dL    Creatinine, Ser 0.80  0.50 - 1.10 mg/dL    Glucose, Bld 143 (*) 70 - 99 mg/dL    Calcium, Ion 1.18  1.13 - 1.30 mmol/L    TCO2 29  0 - 100 mmol/L    Hemoglobin 15.0  12.0 - 15.0 g/dL    HCT 44.0  36.0 - 46.0 %    No results found.  Review of Systems  Constitutional: Negative.   HENT: Negative.   Eyes: Negative.   Cardiovascular: Negative.   Genitourinary: Negative.   Skin: Negative.     Blood pressure 162/60, pulse 91, temperature 98.3 F (36.8 C), temperature source Oral, resp. rate 22, height 5' 4"  (1.626 m), weight 89.359 kg (197 lb), SpO2 96.00%. Physical Exam The patient is alert and oriented in no acute distress the patient complains of pain in the affected upper extremity. The patient is noted to have a normal HEENT exam. Lung fields show equal chest expansion and no shortness of breath abdomen exam is nontender without distention. Lower extremity examination does not show any fracture dislocation or blood clot symptoms. Pelvis is stable neck and back are stable and nontender    Assessment/Plan We are planning surgery for your upper extremity. The risk and  benefits of surgery include risk of bleeding infection anesthesia damage to normal structures and failure of the surgery to accomplish its intended goals of relieving symptoms and restoring function with this in mind we'll going to proceed. I have specifically discussed with the patient the pre-and postoperative regime and the does and don'ts and risk and benefits in great detail. Risk and benefits of surgery also include risk of dystrophy chronic nerve pain failure of the healing process to go onto completion and other inherent risks of surgery The relavent the pathophysiology of the disease/injury process, as well as the alternatives for treatment and postoperative course of action has been discussed in great detail with the patient who desires to proceed.  We will do everything in our power to help you (the patient) restore function to the upper extremity. Is a pleasure to see this patient today.    Paulene Floor 03/07/2012, 2:53 PM

## 2012-03-07 NOTE — Anesthesia Preprocedure Evaluation (Signed)
Anesthesia Evaluation  Patient identified by MRN, date of birth, ID band Patient awake    Reviewed: Allergy & Precautions, H&P , NPO status , Patient's Chart, lab work & pertinent test results  Airway Mallampati: III TM Distance: >3 FB Neck ROM: Full    Dental No notable dental hx. (+) Teeth Intact and Dental Advisory Given   Pulmonary neg pulmonary ROS, shortness of breath and Long-Term Oxygen Therapy, COPD oxygen dependent,  breath sounds clear to auscultation  Pulmonary exam normal       Cardiovascular hypertension, On Medications Rhythm:Regular Rate:Normal     Neuro/Psych negative neurological ROS  negative psych ROS   GI/Hepatic hiatal hernia, GERD-  Medicated,  Endo/Other  diabetes, Type 1, Insulin DependentMorbid obesity  Renal/GU negative Renal ROS  negative genitourinary   Musculoskeletal   Abdominal   Peds  Hematology negative hematology ROS (+)   Anesthesia Other Findings   Reproductive/Obstetrics negative OB ROS                           Anesthesia Physical Anesthesia Plan  ASA: III  Anesthesia Plan: MAC and Regional   Post-op Pain Management: MAC Combined w/ Regional for Post-op pain   Induction: Intravenous  Airway Management Planned: Simple Face Mask  Additional Equipment:   Intra-op Plan:   Post-operative Plan: Extubation in OR  Informed Consent: I have reviewed the patients History and Physical, chart, labs and discussed the procedure including the risks, benefits and alternatives for the proposed anesthesia with the patient or authorized representative who has indicated his/her understanding and acceptance.   Dental advisory given  Plan Discussed with: CRNA  Anesthesia Plan Comments:         Anesthesia Quick Evaluation

## 2012-03-07 NOTE — Anesthesia Postprocedure Evaluation (Signed)
  Anesthesia Post-op Note  Patient: [name]  Procedure(s) Performed: Procedure(s) (LRB) with comments: CUBITAL TUNNEL RELEASE (Right) - ulnar nerve release at the elbow    Patient Location: PACU  Anesthesia Type:MAC combined with regional for post-op pain  Level of Consciousness: awake, alert  and oriented  Airway and Oxygen Therapy: Patient Spontanous Breathing and Patient connected to nasal cannula oxygen  Post-op Pain: none  Post-op Assessment: Post-op Vital signs reviewed, Patient's Cardiovascular Status Stable, Respiratory Function Stable, Patent Airway and No signs of Nausea or vomiting  Post-op Vital Signs: Reviewed and stable  Complications: No apparent anesthesia complications

## 2012-03-07 NOTE — Transfer of Care (Signed)
Immediate Anesthesia Transfer of Care Note  Patient: [name]  Procedure(s) Performed: Procedure(s) (LRB) with comments: CUBITAL TUNNEL RELEASE (Right) - ulnar nerve release at the elbow    Patient Location: PACU  Anesthesia Type:MAC and Regional  Level of Consciousness: awake, alert  and oriented  Airway & Oxygen Therapy: Patient Spontanous Breathing and Patient connected to nasal cannula oxygen  Post-op Assessment: Report given to PACU RN and Post -op Vital signs reviewed and stable  Post vital signs: Reviewed and stable  Complications: No apparent anesthesia complications

## 2012-03-07 NOTE — Anesthesia Procedure Notes (Addendum)
Anesthesia Regional Block:  Supraclavicular block  Pre-Anesthetic Checklist: ,, timeout performed, Correct Patient, Correct Site, Correct Laterality, Correct Procedure, Correct Position, site marked, Risks and benefits discussed, pre-op evaluation, post-op pain management  Laterality: Right  Prep: Maximum Sterile Barrier Precautions used and chloraprep       Needles:  Injection technique: Single-shot  Needle Type: Echogenic Stimulator Needle      Needle Gauge: 22 and 22 G    Additional Needles:  Procedures: ultrasound guided (picture in chart) and nerve stimulator Supraclavicular block Narrative:  Start time: 03/07/2012 2:11 PM End time: 03/07/2012 2:29 PM Injection made incrementally with aspirations every 5 mL. Anesthesiologist: Fitzgerald,MD  Additional Notes: 2% Lidocaine skin wheel. Intercostobrachial block with 8cc of 0.5% Bupivicaine plain.  Supraclavicular block Procedure Name: MAC Date/Time: 03/07/2012 3:19 PM Performed by: Melynda Ripple D Pre-anesthesia Checklist: Patient identified, Emergency Drugs available, Suction available and Patient being monitored Oxygen Delivery Method: Simple face mask Placement Confirmation: positive ETCO2 and CO2 detector

## 2012-03-08 ENCOUNTER — Encounter (HOSPITAL_BASED_OUTPATIENT_CLINIC_OR_DEPARTMENT_OTHER): Payer: Self-pay | Admitting: Orthopedic Surgery

## 2012-03-08 LAB — GLUCOSE, CAPILLARY: Glucose-Capillary: 181 mg/dL — ABNORMAL HIGH (ref 70–99)

## 2012-03-08 MED ORDER — HYDROCODONE-ACETAMINOPHEN 5-500 MG PO TABS: 1.0000 | ORAL_TABLET | Freq: Four times a day (QID) | ORAL | Status: DC | PRN

## 2012-03-08 NOTE — Discharge Summary (Signed)
  SP overnite stay for obs after CUTR Did well no problems .Marland KitchenThe patient is alert and oriented in no acute distress the patient complains of pain in the affected upper extremity. The patient is noted to have a normal HEENT exam. Lung fields show equal chest expansion and no shortness of breath abdomen exam is nontender without distention. Lower extremity examination does not show any fracture dislocation or blood clot symptoms. Pelvis is stable neck and back are stable and nontender DC to home Avanthika Dehnert MD

## 2012-03-10 ENCOUNTER — Emergency Department (HOSPITAL_BASED_OUTPATIENT_CLINIC_OR_DEPARTMENT_OTHER)
Admission: EM | Admit: 2012-03-10 | Discharge: 2012-03-10 | Disposition: A | Discharge status: Home / Self Care | Payer: Medicare Other | Attending: Emergency Medicine | Admitting: Emergency Medicine

## 2012-03-10 ENCOUNTER — Encounter (HOSPITAL_BASED_OUTPATIENT_CLINIC_OR_DEPARTMENT_OTHER): Payer: Self-pay | Admitting: *Deleted

## 2012-03-10 DIAGNOSIS — IMO0001 Reserved for inherently not codable concepts without codable children: Secondary | ICD-10-CM

## 2012-03-10 DIAGNOSIS — Z9889 Other specified postprocedural states: Secondary | ICD-10-CM | POA: Diagnosis not present

## 2012-03-10 DIAGNOSIS — Z4801 Encounter for change or removal of surgical wound dressing: Secondary | ICD-10-CM | POA: Insufficient documentation

## 2012-03-10 DIAGNOSIS — Z4789 Encounter for other orthopedic aftercare: Secondary | ICD-10-CM

## 2012-03-10 NOTE — ED Notes (Signed)
Wants arm rechecked doesn't like the way they sent her home wrapped

## 2012-03-10 NOTE — ED Provider Notes (Signed)
History     CSN: [id number]  Arrival date & time 03/10/12  57   First MD Initiated Contact with Patient 03/10/12 1333      Chief Complaint  Patient presents with  . arm recheck     (Consider location/radiation/quality/duration/timing/severity/associated sxs/prior treatment) Patient is a 76 y.o. female presenting with wound check. The history is provided by the patient. No language interpreter was used.  Wound Check   Pt had surgery by Dr. Amedeo Plenty on 10/31.   Pt reports the bandage that he placed on arm is falling off.   Padded dressing is sagging below incision area.   Past Medical History  Diagnosis Date  . Irritable bowel syndrome   . Other chronic nonalcoholic liver disease   . Esophageal reflux   . Allergic rhinitis, cause unspecified     Sinus CT Rec 12-23-2009  . 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 Sciatica   . Shingles 2010  . Steatohepatitis   . Diabetes mellitus   . Diverticulosis   . Hiatal hernia   . Esophageal stricture   . Diarrhea   . GERD (gastroesophageal reflux disease)   . Shortness of breath   . Hypertension   . Arthritis   . Glaucoma   . Complication of anesthesia     takes along time to wake up  . Oxygen deficiency     Past Surgical History  Procedure Date  . Cholecystectomy open 1978  . Liver biopsy 08-1992  . Parathyroid exploration   . Tonsillectomy and adenoidectomy   . Total abdominal hysterectomy   . Esophagogastroduodenoscopy 9381,82-99    H Hernia,es.stricture s/p dil 66F  . Colonoscopy 07-2001    mild diverticulosis  . Carpal tunnel release     right hand  . Cataract extraction     Bilateral  . Ulnar nerve transposition 12/07/2011    Procedure: ULNAR NERVE DECOMPRESSION/TRANSPOSITION;this was cancelled-not done  Surgeon: Cammie Sickle., MD;  Location: [facility];  Service: Orthopedics;  Laterality: Right;  right ulnar nerve in situ decompression  .  Ulnar tunnel release 03/07/2012    Procedure: CUBITAL TUNNEL RELEASE;  Surgeon: Roseanne Kaufman, MD;  Location: [facility];  Service: Orthopedics;  Laterality: Right;  ulnar nerve release at the elbow      Family History  Problem Relation Age of Onset  . Lung cancer Mother     small cell;Byssinosis  . Heart disease Father   . Lung cancer Sister   . Liver cancer Sister     ? mets from another area of the body  . Diabetes      grandmother  . Stroke Maternal Grandfather     History  Substance Use Topics  . Smoking status: Former Smoker    Types: Cigarettes    Quit date: 05/08/1988  . Smokeless tobacco: Never Used  . Alcohol Use: No    OB History    Grav Para Term Preterm Abortions TAB SAB Ect Mult Living                  Review of Systems  Musculoskeletal: Negative for joint swelling.  Skin: Positive for wound.  All other systems reviewed and are negative.    Allergies  Clidinium-chlordiazepoxide; Codeine; and Tramadol  Home Medications   Current Outpatient Rx  Name  Route  Sig  Dispense  Refill  . ACETAMINOPHEN 500 MG PO TABS   Oral  Take 500 mg by mouth every 6 (six) hours as needed.           . ALPRAZOLAM 0.5 MG PO TABS   Oral   Take 0.5 mg by mouth 3 (three) times daily as needed.           Marland Kitchen MAGIC MOUTHWASH   Oral   Take 10 mLs by mouth 4 (four) times daily.   150 mL   1   . ASPIRIN 325 MG PO TABS   Oral   Take 325 mg by mouth daily.           Marland Kitchen CANDESARTAN CILEXETIL 32 MG PO TABS      Take 16 mg daily         . CEPHALEXIN 250 MG PO CAPS   Oral   Take 250 mg by mouth at bedtime.           Marland Kitchen VITAMIN D3 1000 UNITS PO CAPS   Oral   Take 2 capsules by mouth daily.           . CHOLESTYRAMINE 4 G PO PACK      Take 1 hour apart from all other medication   30 each   2   . DICYCLOMINE HCL 10 MG PO CAPS   Oral   Take 10 mg by mouth daily as needed.           Marland Kitchen HYDROCODONE-ACETAMINOPHEN 5-500 MG PO TABS    Oral   Take 1 tablet by mouth every 6 (six) hours as needed for pain.   30 tablet   0   . INSULIN GLARGINE 100 UNIT/ML [place] SOLN      36 Units daily. Inject 36 units every morning and 29-32 units at night         . INSULIN LISPRO (HUMAN) 100 UNIT/ML [place] SOLN   Subcutaneous   Inject into the skin 3 (three) times daily before meals. Sliding scale          . IPRATROPIUM BROMIDE HFA 17 MCG/ACT IN AERS   Inhalation   Inhale 2 puffs into the lungs 4 (four) times daily.   1 Inhaler   11   . IPRATROPIUM BROMIDE 0.02 % IN SOLN   Nebulization   Take 2.5 mLs (500 mcg total) by nebulization 4 (four) times daily.   120 mL   0   . IMODIUM PO   Oral   Take by mouth as needed.           Marland Kitchen MECLIZINE HCL 25 MG PO TABS   Oral   Take 25 mg by mouth 3 (three) times daily as needed.         Marland Kitchen METOPROLOL SUCCINATE ER 25 MG PO TB24   Oral   Take 25 mg by mouth daily.         Marland Kitchen OMEPRAZOLE 40 MG PO CPDR   Oral   Take 1 capsule (40 mg total) by mouth 2 (two) times daily.   60 capsule   1   . PROMETHAZINE-CODEINE 6.25-10 MG/5ML PO SYRP   Oral   Take 5 mLs by mouth every 6 (six) hours as needed for cough.   200 mL   0   . TIMOLOL MALEATE 0.5 % OP SOLN   Left Eye   Place 1 drop into the left eye 2 (two) times daily.          Marland Kitchen TIOTROPIUM BROMIDE MONOHYDRATE 18 MCG IN CAPS   Inhalation  Place 1 capsule (18 mcg total) into inhaler and inhale daily.   30 capsule   prn     BP 152/74  Pulse 86  Temp 98.4 F (36.9 C) (Oral)  Resp 18  Ht 5' 4"  (1.626 m)  Wt 197 lb (89.359 kg)  BMI 33.82 kg/m2  SpO2 95%  Physical Exam  Nursing note and vitals reviewed. Constitutional: She appears well-developed and well-nourished.  Musculoskeletal: Normal range of motion.       Sutured incision right elbow,  No sign of infection,  nv and ns intact,    Neurological: She is alert.  Skin: Skin is warm.  Psychiatric: She has a normal mood and affect.    ED Course  Procedures  (including critical care time)  Labs Reviewed - No data to display No results found.   1. Loose cast   2. Wound check, dressing change       MDM  Pt placed in a bulky dressing.   Pt advised to follow up with Dr. Amedeo Plenty as scheduled        [place], [place] 03/10/12 [phone]

## 2012-03-11 NOTE — ED Provider Notes (Signed)
Medical screening examination/treatment/procedure(s) were performed by non-physician practitioner and as supervising physician I was immediately available for consultation/collaboration.  Threasa Beards, MD 03/11/12 [phone]

## 2012-03-12 NOTE — Op Note (Signed)
NAMETREA, CARNEGIE              ACCOUNT NO.:  [id number]  MEDICAL RECORD NO.:  [id number]  LOCATION:                               FACILITY:  [facility]  PHYSICIAN:  Satira Anis. Amedeo Plenty, M.D.     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  POSTOPERATIVE DIAGNOSIS:  Right ulnar nerve neuropathy at the elbow.  PROCEDURE:  Right ulnar nerve decompression at the elbow including __________ compression.  SURGEON:  Satira Anis. Amedeo Plenty, M.D.  ASSISTANT:  Avelina Laine, PA-C.  COMPLICATIONS:  None.  ANESTHESIA:  Block with light IV sedation.  TOURNIQUET TIME:  Less than 25 minutes.  INDICATIONS FOR THE PROCEDURE:  A 76 year old female who has a significant history of ulnar nerve neuropathy.  I have counseled her in regard to risks and benefits of surgery.  I feel that given her objective and subjective and electrodiagnostic diagnosis, she is a candidate who put her nerve in the best environment possible and see if the nerve will respond.  She understands that I am not guaranteeing a perfect result, but that I am trying to give the nerve a chance to feel better.  I have discussed with her the risks and benefits.  I have discussed with her pre and postop routines, options, etc.  With this in mind, I did feel that she is a patient who deserves a chance of decompression to get rid of her chronic pain complaints, understanding realistic expectations.  Medical history is reviewed.  She was given a block by Dr. Oren Bracket.  OPERATIVE PROCEDURE:  She was taken to the operative suite, underwent placement in a beach-chair position.  SCD hose were placed.  She was prepped and draped in usual sterile fashion about the right upper extremity with Betadine scrub and paint.  Following this, time-out was called, arm was secured, and isolated with  __________.  Following this, incision was made posteromedially.  Dissection was carried down.  I picked up the arcade of Struthers proximally and then dissected and released the arcade of Struthers __________ ligament.  I made sure the nerve was tension free.  It did not sublux.  It sat nicely in a decompressed state and did show signs of chronic compression.  She tolerated this well.  Following this, I irrigated copiously with tourniquet deflated and obtained hemostasis with bipolar electrocautery. Wound was closed with Prolene.  She was placed in sterile dressing.  She will be admitted overnight for O2 monitoring and observation.  I have discussed her all issues at length.  She understands realistic expectations and will return to the office to see [place] in 12 days and notify me should any problems occur.  It was a pleasure to participate in her postop recovery.  All questions have been encouraged and answered.     Satira Anis. Amedeo Plenty, M.D.     [facility]  D:  03/07/2012  T:  03/08/2012  Job:  [id number]  cc:   Tarri Fuller D. Annamaria Boots, MD, FCCP, FACP

## 2012-03-27 DIAGNOSIS — N3941 Urge incontinence: Secondary | ICD-10-CM | POA: Diagnosis not present

## 2012-03-27 DIAGNOSIS — R339 Retention of urine, unspecified: Secondary | ICD-10-CM | POA: Diagnosis not present

## 2012-03-27 DIAGNOSIS — N3020 Other chronic cystitis without hematuria: Secondary | ICD-10-CM | POA: Diagnosis not present

## 2012-04-03 DIAGNOSIS — M25519 Pain in unspecified shoulder: Secondary | ICD-10-CM | POA: Diagnosis not present

## 2012-04-03 DIAGNOSIS — M542 Cervicalgia: Secondary | ICD-10-CM | POA: Diagnosis not present

## 2012-04-03 DIAGNOSIS — M5030 Other cervical disc degeneration, unspecified cervical region: Secondary | ICD-10-CM | POA: Diagnosis not present

## 2012-04-09 DIAGNOSIS — H8110 Benign paroxysmal vertigo, unspecified ear: Secondary | ICD-10-CM | POA: Diagnosis not present

## 2012-04-09 DIAGNOSIS — E559 Vitamin D deficiency, unspecified: Secondary | ICD-10-CM | POA: Diagnosis not present

## 2012-04-09 DIAGNOSIS — R5381 Other malaise: Secondary | ICD-10-CM | POA: Diagnosis not present

## 2012-04-09 DIAGNOSIS — R5383 Other fatigue: Secondary | ICD-10-CM | POA: Diagnosis not present

## 2012-04-09 DIAGNOSIS — M2550 Pain in unspecified joint: Secondary | ICD-10-CM | POA: Diagnosis not present

## 2012-04-09 DIAGNOSIS — IMO0001 Reserved for inherently not codable concepts without codable children: Secondary | ICD-10-CM | POA: Diagnosis not present

## 2012-04-09 DIAGNOSIS — E785 Hyperlipidemia, unspecified: Secondary | ICD-10-CM | POA: Diagnosis not present

## 2012-04-23 ENCOUNTER — Ambulatory Visit (INDEPENDENT_AMBULATORY_CARE_PROVIDER_SITE_OTHER): Payer: Medicare Other | Admitting: Internal Medicine

## 2012-04-23 ENCOUNTER — Encounter: Payer: Self-pay | Admitting: Internal Medicine

## 2012-04-23 VITALS — BP 120/76 | HR 78 | Ht 64.0 in | Wt 196.8 lb

## 2012-04-23 DIAGNOSIS — J310 Chronic rhinitis: Secondary | ICD-10-CM | POA: Diagnosis not present

## 2012-04-23 DIAGNOSIS — Z23 Encounter for immunization: Secondary | ICD-10-CM | POA: Diagnosis not present

## 2012-04-23 DIAGNOSIS — J449 Chronic obstructive pulmonary disease, unspecified: Secondary | ICD-10-CM | POA: Diagnosis not present

## 2012-04-23 NOTE — Progress Notes (Signed)
Patient ID: [name], female    DOB: 12-Dec-1935, 76 y.o.   MRN: [id number]  HPI SATURATION QUALIFICATIONS: Patient Saturations on Room Air while Ambulating = 84% Patient Saturations on 2.5 Liters of oxygen while Ambulating = 92%[name],CMA  09/28/10-  75 yoF former smoker with COPD, complicated by chronic rhinitis, GERD. Last here 04/29/10- note reviewed. Acute visit.-- Woke with soreness across under her breasts. Losing voice. Coughing spells, with much liquid coming up and from nose. Prior to that hadn't been coughing. C/o weak, dizzy, light headed- relieved by her oxygen. . Moving into a smaller apartment. Denies fever. Easy DOE short walks room to room.   10/31/10- 44 yoF former smoker with COPD, complicated by chronic rhinitis, GERD. She denies major change since last here. Has to stay in to avoid heat and on her oxygen much of the time in the house. Coughs scant clear or lemon yellow, with some hoarseness which comes and goes. She describes hoarseness as worse with phone conversations, variable but rarely if ever clear. She had seen Dr [name] years ago for epistaxis. Easy choke with eating. Discussed aspiration precautions, chin tuck.   02/26/11- 73 yoF former smoker with COPD, complicated by chronic rhinitis, GERD. Has had flu vaccine. No coughing through the summer. We first cool days as she stepped outside she would begin to cough again. Dry hacking cough. Very aware of active reflux anytime she bends over, regurgitating recently swallowed food. Has also had some diarrhea. Complains of spasms intermittently left lower anterior chest and left upper quadrant. It may all be GI. She thinks it is part of her diabetic neuropathy. Has appointment soon with Dr.Brodie.  08/16/11-75 yoF former smoker with COPD, complicated by chronic rhinitis, GERD. During the winter had prolonged diarrhea evaluated by Dr [name] and apparently attributed to her diabetic neuropathy. May have had  esophagitis-was told to take Magic mouthwash. Gradually easier dyspnea on exertion. Now on oxygen 2-3 L most of the time. She came today with acute complaint of aching pain intermittently for the past several weeks, dull. Associated with mid back at strap level. Pain is gone right now. She saw no effect of activity or position and said pain was nonpleuritic or radiating.  CXR- 08/16/11- reviewed w/ her. NAD with no acute process.   08/28/11-75 yoF former smoker with COPD, complicated by chronic rhinitis, GERD,  DM. Back pain comes and goes. She implies that it is better today and says that she really has been present a long time as part of her degenerative back pain. Complains of feeling sluggish and tired. Recent bronchitis with productive green sputum watery nose no fever. Using Atrovent. Intolerant of albuterol and Symbicort because of overstimulation. Home oxygen is 2-1/2-3 L. CXR 08/15/11-  IMPRESSION:  Hyperinflation.  No active cardiopulmonary disease.  Original Report Authenticated By: [name], M.D.   12/28/11- 72 yoF former smoker with COPD, complicated by chronic rhinitis, GERD,  DM. Not doing well-stays on O2 all the time; since being on Spiriva at first did great but has noticed having to stop more and rest during the day(nap) She  stays indoors most of the time without many friends to do things with. She often has self-limited sharp twinges right anterior chest wall and left infrascapular back. With her known degenerative disc disease, we discussed the possibility this was nerve root irritation. Spiriva helps variable cough.  04/23/12- 40 yoF former smoker with COPD, complicated by chronic rhinitis, GERD,  DM. FOLLOWS FOR: still  has good and bad days with SOB and activity levels Watery rhinorrhea. No acute problems. No respiratory problems associated with surgery for nerve impingement in her right hand. He continues oxygen 3 or 3 a half liters/Advanced PFT: 01/16/2012 severe  obstructive airways disease with insignificant response to bronchodilator, air trapping, diffusion moderately reduced. FEV1 0.82/45%, FEV1/FEC 0.46. Emphysema pattern on the loop. TLC 100%, RV 148%, DLCO 47%.  Review of Systems-see HPI Constitutional:   No-   weight loss, night sweats, fevers, chills,  +fatigue, lassitude. HEENT:   No-  headaches, difficulty swallowing, tooth/dental problems, sore throat,       No-  sneezing, itching, ear ache, nasal congestion, post nasal drip,  CV:  + Atypical chest pains, no-orthopnea, PND, swelling in lower extremities, anasarca, dizziness, palpitations Resp: + shortness of breath with exertion or at rest.              No-   productive cough,  + non-productive cough,  No- coughing up of blood.              No-   change in color of mucus.  No- wheezing.   Skin: No-   rash or lesions. GI:  No-   heartburn, indigestion,   +abdominal pain, nausea, vomiting,   +diarrhea,  GU:  MS:  No-   joint pain or swelling.    + back pain. Neuro-     nothing unusual Psych:  No- change in mood or affect. + depression or anxiety.  No memory loss.    Objective:   Physical Exam BP 120/76  Pulse 78  Ht 5' 4"  (1.626 m)  Wt 196 lb 12.8 oz (89.268 kg)  BMI 33.78 kg/m2  SpO2 96% General- Alert, Oriented, Affect-animated, cheerful, Distress- none acute. Talkative.  on O2 2.5 L Skin- rash-none, lesions- none, excoriation- none Lymphadenopathy- none Head- atraumatic            Eyes- Gross vision intact, PERRLA, conjunctivae clear secretions            Ears- Hearing, canals-normal            Nose- Clear, no-Septal dev, mucus, polyps, erosion, perforation             Throat- Mallampati II , mucosa clear , drainage- none, tonsils- atrophic Neck- flexible , trachea midline, no stridor , thyroid nl, carotid no bruit Chest - symmetrical excursion , unlabored           Heart/CV- RRR , no murmur , no gallop  , no rub, nl s1 s2                           - JVD- none , edema- none,  stasis changes- none, varices- none           Lung- +diminished, clear, wheeze- none, cough- none , dullness-none, rub- none           Chest wall- back is not tender Abd-  Br/ Gen/ Rectal- Not done, not indicated Extrem- cyanosis- none, clubbing, none, atrophy- none, strength- nl Neuro- grossly intact to observation

## 2012-04-23 NOTE — Patient Instructions (Addendum)
Pneumovax  Continue oxygen  Please call as needed

## 2012-05-05 NOTE — Assessment & Plan Note (Signed)
Plan-saline nasal gel.

## 2012-05-05 NOTE — Assessment & Plan Note (Addendum)
Severe COPD but mainly emphysema pattern without much variability or bronchospasm to treat. She remains oxygen dependent continuously and uses it consistently. Plan pneumonia vaccine

## 2012-06-12 DIAGNOSIS — Z1239 Encounter for other screening for malignant neoplasm of breast: Secondary | ICD-10-CM | POA: Diagnosis not present

## 2012-06-12 DIAGNOSIS — I10 Essential (primary) hypertension: Secondary | ICD-10-CM | POA: Diagnosis not present

## 2012-06-12 DIAGNOSIS — IMO0001 Reserved for inherently not codable concepts without codable children: Secondary | ICD-10-CM | POA: Diagnosis not present

## 2012-06-12 DIAGNOSIS — B370 Candidal stomatitis: Secondary | ICD-10-CM | POA: Diagnosis not present

## 2012-06-12 DIAGNOSIS — E785 Hyperlipidemia, unspecified: Secondary | ICD-10-CM | POA: Diagnosis not present

## 2012-06-14 ENCOUNTER — Other Ambulatory Visit: Payer: Self-pay | Admitting: Endocrinology

## 2012-06-14 DIAGNOSIS — M25569 Pain in unspecified knee: Secondary | ICD-10-CM | POA: Diagnosis not present

## 2012-06-14 DIAGNOSIS — Z1231 Encounter for screening mammogram for malignant neoplasm of breast: Secondary | ICD-10-CM

## 2012-06-26 DIAGNOSIS — H903 Sensorineural hearing loss, bilateral: Secondary | ICD-10-CM | POA: Diagnosis not present

## 2012-06-26 DIAGNOSIS — R42 Dizziness and giddiness: Secondary | ICD-10-CM | POA: Diagnosis not present

## 2012-07-05 DIAGNOSIS — M25569 Pain in unspecified knee: Secondary | ICD-10-CM | POA: Diagnosis not present

## 2012-07-10 ENCOUNTER — Ambulatory Visit
Admission: RE | Admit: 2012-07-10 | Discharge: 2012-07-10 | Disposition: A | Discharge status: Home / Self Care | Payer: Medicare Other | Source: Ambulatory Visit | Attending: Endocrinology | Admitting: Endocrinology

## 2012-07-10 DIAGNOSIS — Z1231 Encounter for screening mammogram for malignant neoplasm of breast: Secondary | ICD-10-CM

## 2012-07-19 ENCOUNTER — Emergency Department (HOSPITAL_COMMUNITY)
Admission: EM | Admit: 2012-07-19 | Discharge: 2012-07-19 | Disposition: A | Discharge status: Home / Self Care | Payer: Medicare Other | Attending: Emergency Medicine | Admitting: Emergency Medicine

## 2012-07-19 ENCOUNTER — Emergency Department (HOSPITAL_COMMUNITY): Payer: Medicare Other

## 2012-07-19 DIAGNOSIS — R42 Dizziness and giddiness: Secondary | ICD-10-CM | POA: Diagnosis not present

## 2012-07-19 DIAGNOSIS — Z8669 Personal history of other diseases of the nervous system and sense organs: Secondary | ICD-10-CM | POA: Insufficient documentation

## 2012-07-19 DIAGNOSIS — J4489 Other specified chronic obstructive pulmonary disease: Secondary | ICD-10-CM | POA: Insufficient documentation

## 2012-07-19 DIAGNOSIS — K219 Gastro-esophageal reflux disease without esophagitis: Secondary | ICD-10-CM | POA: Diagnosis not present

## 2012-07-19 DIAGNOSIS — I10 Essential (primary) hypertension: Secondary | ICD-10-CM | POA: Insufficient documentation

## 2012-07-19 DIAGNOSIS — Z8739 Personal history of other diseases of the musculoskeletal system and connective tissue: Secondary | ICD-10-CM | POA: Insufficient documentation

## 2012-07-19 DIAGNOSIS — Z87891 Personal history of nicotine dependence: Secondary | ICD-10-CM | POA: Insufficient documentation

## 2012-07-19 DIAGNOSIS — Z7982 Long term (current) use of aspirin: Secondary | ICD-10-CM | POA: Insufficient documentation

## 2012-07-19 DIAGNOSIS — J449 Chronic obstructive pulmonary disease, unspecified: Secondary | ICD-10-CM | POA: Diagnosis not present

## 2012-07-19 DIAGNOSIS — R079 Chest pain, unspecified: Secondary | ICD-10-CM | POA: Diagnosis not present

## 2012-07-19 DIAGNOSIS — Z794 Long term (current) use of insulin: Secondary | ICD-10-CM | POA: Insufficient documentation

## 2012-07-19 DIAGNOSIS — Z8719 Personal history of other diseases of the digestive system: Secondary | ICD-10-CM | POA: Diagnosis not present

## 2012-07-19 DIAGNOSIS — Z79899 Other long term (current) drug therapy: Secondary | ICD-10-CM | POA: Diagnosis not present

## 2012-07-19 DIAGNOSIS — E119 Type 2 diabetes mellitus without complications: Secondary | ICD-10-CM | POA: Insufficient documentation

## 2012-07-19 DIAGNOSIS — R0602 Shortness of breath: Secondary | ICD-10-CM | POA: Diagnosis not present

## 2012-07-19 LAB — COMPREHENSIVE METABOLIC PANEL
ALT: 17 U/L (ref 0–35)
AST: 22 U/L (ref 0–37)
Albumin: 3.8 g/dL (ref 3.5–5.2)
CO2: 32 mEq/L (ref 19–32)
Chloride: 97 mEq/L (ref 96–112)
Creatinine, Ser: 0.54 mg/dL (ref 0.50–1.10)
GFR calc non Af Amer: >90 mL/min (ref >90)
Sodium: 137 mEq/L (ref 135–145)
Total Bilirubin: 0.2 mg/dL — ABNORMAL LOW (ref 0.3–1.2)

## 2012-07-19 LAB — CBC
MCHC: 32.9 g/dL (ref 30.0–36.0)
Platelets: 197 10*3/uL (ref 150–400)
RDW: 13.1 % (ref 11.5–15.5)
WBC: 9.7 10*3/uL (ref 4.0–10.5)

## 2012-07-19 LAB — URINALYSIS, ROUTINE W REFLEX MICROSCOPIC
Glucose, UA: NEGATIVE mg/dL
Leukocytes, UA: NEGATIVE
pH: 6 (ref 5.0–8.0)

## 2012-07-19 LAB — DIFFERENTIAL
Basophils Absolute: 0 10*3/uL (ref 0.0–0.1)
Basophils Relative: 0 % (ref 0–1)
Lymphocytes Relative: 33 % (ref 12–46)
Monocytes Absolute: 0.4 10*3/uL (ref 0.1–1.0)
Neutro Abs: 5.7 10*3/uL (ref 1.7–7.7)
Neutrophils Relative %: 59 % (ref 43–77)

## 2012-07-19 LAB — URINE MICROSCOPIC-ADD ON

## 2012-07-19 LAB — GLUCOSE, CAPILLARY
Glucose-Capillary: 125 mg/dL — ABNORMAL HIGH (ref 70–99)
Glucose-Capillary: 113 mg/dL — ABNORMAL HIGH (ref 70–99)

## 2012-07-19 LAB — APTT: aPTT: 32 seconds (ref 24–37)

## 2012-07-19 LAB — PROTIME-INR
INR: 0.93 (ref 0.00–1.49)
Prothrombin Time: 12.4 seconds (ref 11.6–15.2)

## 2012-07-19 LAB — POCT I-STAT TROPONIN I: Troponin i, poc: 0.01 ng/mL (ref 0.00–0.08)

## 2012-07-19 NOTE — ED Provider Notes (Signed)
History     CSN: [id number]  Arrival date & time 07/19/12  1216   First MD Initiated Contact with Patient 07/19/12 1312      No chief complaint on file.   (Consider location/radiation/quality/duration/timing/severity/associated sxs/prior treatment) HPI Comments: LEISHA TRINKLE is a 77 y.o. Female is here for evaluation of dizziness. She had been driving today when she suddenly developed dizziness. It gave her the sensation of spinning, chest pain, trouble breathing; and it may, driving, difficult. She was, however, able to continue driving and drove herself home. She called EMS to bring her here, for evaluation. She denies headache, weakness, paresthesia, fever, or chills. She ate breakfast today, but not lunch.   Past Medical History  Diagnosis Date  . Irritable bowel syndrome   . Other chronic nonalcoholic liver disease   . Esophageal reflux   . Allergic rhinitis, cause unspecified     Sinus CT Rec 12-23-2009  . 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 Sciatica   . Shingles 2010  . Steatohepatitis   . Diabetes mellitus   . Diverticulosis   . Hiatal hernia   . Esophageal stricture   . Diarrhea   . GERD (gastroesophageal reflux disease)   . Shortness of breath   . Hypertension   . Arthritis   . Glaucoma   . Complication of anesthesia     takes along time to wake up  . Oxygen deficiency     Past Surgical History  Procedure Laterality Date  . Cholecystectomy open  1978  . Liver biopsy  08-1992  . Parathyroid exploration    . Tonsillectomy and adenoidectomy    . Total abdominal hysterectomy    . Esophagogastroduodenoscopy  6503,54-65    H Hernia,es.stricture s/p dil 42F  . Colonoscopy  07-2001    mild diverticulosis  . Carpal tunnel release      right hand  . Cataract extraction      Bilateral  . Ulnar nerve transposition  12/07/2011    Procedure: ULNAR NERVE DECOMPRESSION/TRANSPOSITION;this was cancelled-not  done  Surgeon: Cammie Sickle., MD;  Location: [place];  Service: Orthopedics;  Laterality: Right;  right ulnar nerve in situ decompression  . Ulnar tunnel release  03/07/2012    Procedure: CUBITAL TUNNEL RELEASE;  Surgeon: Roseanne Kaufman, MD;  Location: [place];  Service: Orthopedics;  Laterality: Right;  ulnar nerve release at the elbow      Family History  Problem Relation Age of Onset  . Lung cancer Mother     small cell;Byssinosis  . Heart disease Father   . Lung cancer Sister   . Liver cancer Sister     ? mets from another area of the body  . Diabetes      grandmother  . Stroke Maternal Grandfather     History  Substance Use Topics  . Smoking status: Former Smoker    Types: Cigarettes    Quit date: 05/08/1988  . Smokeless tobacco: Never Used  . Alcohol Use: No    OB History   Grav Para Term Preterm Abortions TAB SAB Ect Mult Living                  Review of Systems  All other systems reviewed and are negative.    Allergies  Clidinium-chlordiazepoxide; Codeine; and Tramadol  Home Medications   Current Outpatient Rx  Name  Route  Sig  Dispense  Refill  . acetaminophen (TYLENOL) 500 MG tablet   Oral   Take 500 mg by mouth every 6 (six) hours as needed for pain.          Marland Kitchen ALPRAZolam (XANAX) 0.5 MG tablet   Oral   Take 0.25 mg by mouth at bedtime as needed for anxiety.          Marland Kitchen aspirin 325 MG tablet   Oral   Take 325 mg by mouth daily.           . candesartan (ATACAND) 32 MG tablet      Take 16 mg daily         . dicyclomine (BENTYL) 10 MG capsule   Oral   Take 10 mg by mouth daily as needed.           . insulin aspart (NOVOLOG PENFILL) 100 UNIT/ML injection   Subcutaneous   Inject 0-12 Units into the skin 3 (three) times daily before meals. SSI         . insulin glargine (LANTUS) 100 UNIT/ML injection      29-38 Units daily. Inject 38 units every morning and 29 units at supper time.          Marland Kitchen ipratropium (ATROVENT HFA) 17 MCG/ACT inhaler   Inhalation   Inhale 2 puffs into the lungs 2 (two) times daily as needed (shortness of breath).         . meclizine (ANTIVERT) 25 MG tablet   Oral   Take 25 mg by mouth 3 (three) times daily as needed for dizziness.          . metoprolol succinate (TOPROL-XL) 25 MG 24 hr tablet   Oral   Take 25 mg by mouth daily.         Marland Kitchen omeprazole (PRILOSEC) 40 MG capsule   Oral   Take 40 mg by mouth daily.          . promethazine-codeine (PHENERGAN WITH CODEINE) 6.25-10 MG/5ML syrup   Oral   Take 5 mLs by mouth every 6 (six) hours as needed for cough.   200 mL   0   . timolol (TIMOPTIC) 0.5 % ophthalmic solution   Left Eye   Place 1 drop into the left eye 2 (two) times daily.          Marland Kitchen tiotropium (SPIRIVA HANDIHALER) 18 MCG inhalation capsule   Inhalation   Place 1 capsule (18 mcg total) into inhaler and inhale daily.   30 capsule   prn   . Alum & Mag Hydroxide-Simeth (MAGIC MOUTHWASH) SOLN   Oral   Take 10 mLs by mouth daily as needed (as needed to help with acid reflux per Dr. Delfin Edis).         . cholestyramine (QUESTRAN) 4 G packet   Oral   Take 1 packet by mouth daily as needed (for bowel problems.). Take 1 hour apart from all other medication         . Loperamide HCl (IMODIUM PO)   Oral   Take 1 tablet by mouth daily as needed (loose stool.).          Marland Kitchen Vitamin D, Ergocalciferol, (DRISDOL) 50000 UNITS CAPS   Oral   Take 50,000 Units by mouth every 7 (seven) days. Sunday.           BP 119/68  Pulse 91  Temp(Src) 98.6 F (37 C) (Oral)  Resp 20  SpO2 96%  Physical Exam  Nursing note  and vitals reviewed. Constitutional: She is oriented to person, place, and time. She appears well-developed and well-nourished.  HENT:  Head: Normocephalic and atraumatic.  Eyes: Conjunctivae and EOM are normal. Pupils are equal, round, and reactive to light.  Neck: Normal range of motion and phonation  normal. Neck supple.  Cardiovascular: Normal rate, regular rhythm and intact distal pulses.   Pulmonary/Chest: Effort normal and breath sounds normal. She exhibits no tenderness.  Abdominal: Soft. She exhibits no distension. There is no tenderness. There is no guarding.  Musculoskeletal: Normal range of motion.  Neurological: She is alert and oriented to person, place, and time. She has normal strength. No cranial nerve deficit. She exhibits normal muscle tone. Coordination normal.  No nystagmus  Skin: Skin is warm and dry.  Psychiatric: She has a normal mood and affect. Her behavior is normal. Judgment and thought content normal.    ED Course  Procedures (including critical care time)  Reevaluation, at discharge, she is alert, comfortable. She states that her dizziness is better.     Date: 02/23/2012  Rate: 88  Rhythm: normal sinus rhythm  QRS Axis: normal  PR and QT Intervals: normal  ST/T Wave abnormalities: normal  PR and QRS Conduction Disutrbances:right bundle branch block and left posterior fascicular block  Narrative Interpretation:   Old EKG Reviewed: none available   Labs Reviewed  GLUCOSE, CAPILLARY - Abnormal; Notable for the following:    Glucose-Capillary 125 (*)    All other components within normal limits  COMPREHENSIVE METABOLIC PANEL - Abnormal; Notable for the following:    Glucose, Bld 119 (*)    Total Bilirubin 0.2 (*)    All other components within normal limits  URINALYSIS, ROUTINE W REFLEX MICROSCOPIC - Abnormal; Notable for the following:    APPearance CLOUDY (*)    Hgb urine dipstick TRACE (*)    All other components within normal limits  GLUCOSE, CAPILLARY - Abnormal; Notable for the following:    Glucose-Capillary 113 (*)    All other components within normal limits  PROTIME-INR  APTT  CBC  DIFFERENTIAL  TROPONIN I  URINE MICROSCOPIC-ADD ON  POCT I-STAT TROPONIN I   Ct Head Wo Contrast  07/19/2012  *RADIOLOGY REPORT*  Clinical Data:  Dizziness  CT HEAD WITHOUT CONTRAST  Technique:  Contiguous axial images were obtained from the base of the skull through the vertex without contrast.  Comparison: None.  Findings: Age appropriate atrophy.  Chronic microvascular ischemic change in the white matter.  No acute infarct.  Negative for hemorrhage or mass.  Calvarium is intact.  IMPRESSION: Mild chronic microvascular ischemia.  No acute abnormality.   Original Report Authenticated By: Carl Best, M.D.      1. Dizziness       MDM  Nonspecific ongoing, chronic dizziness. No evidence for significant acute change. Doubt CVA, metabolic instability, acute infectious process, or labyrinthitis.Doubt metabolic instability, serious bacterial infection or impending vascular collapse; the patient is stable for discharge.   Plan: Home Medications- usual; Home Treatments- rest; Recommended follow up- PCP and Neurology f/u in 1 week     Richarda Blade, MD 07/19/12 1901

## 2012-07-19 NOTE — ED Notes (Signed)
Pt in via [facility], pt c/o mid CP non radiating with SOB more than usual, pt denies N/V/D, pt reports symptom onset today @ 11:00 while driving & began feeling dizzy, pt drove to her apt & called EMS, pt A&O x4, follows commands, speaks in complete sentences, pt reports taking 325 mg ASA prior to arrival, pt hx of vertigo, per pt in route states, "This is just different from normal vertigo."

## 2012-07-19 NOTE — ED Notes (Signed)
Pt hooked back up to monitor.

## 2012-07-19 NOTE — ED Notes (Signed)
Pt to CT

## 2012-07-19 NOTE — ED Notes (Signed)
AIDET performed.

## 2012-07-22 ENCOUNTER — Telehealth: Payer: Self-pay | Admitting: Internal Medicine

## 2012-07-22 NOTE — Telephone Encounter (Signed)
Pt was seen in [facility] ER on Friday after having a "dizzy spell" while driving.  PT c/o sitting at a stop light and began having dizziness with chest tightness, arms and hands shaking.  Pt was wearing oxygen at the time.  PT states that SOB was about the same as it always is.  ER did Head ct and pt was told it was just dizziness and was told to see a neurologist.  PT states that her primary md [name] is out of town until the 24th.  PT wants to know if Dr Annamaria Boots thinks this could be coming from her lungs and if he thinks she needs to see a neurologist.  PT has already seen an ENT and inner ear problems were ruled out.  Please advise.

## 2012-07-22 NOTE — Telephone Encounter (Signed)
I would have her call and ask for appointment with next available [facility] neurologist for her complaint of "dizzy spells"

## 2012-07-22 NOTE — Telephone Encounter (Signed)
Per CY-unless she was having an obvious breathing problem at the time, this sounds more like a Neurologic issue. Recommend she sees Neurology for "dizzy spells".

## 2012-07-22 NOTE — Telephone Encounter (Signed)
Per CY-ok to have [facility] refer to Neurology -[facility] for dx: dizzy spells.

## 2012-07-22 NOTE — Telephone Encounter (Signed)
Called, spoke with pt.  Pt states her breathing is unchanged since last OV with CDY.  Reports she does have SOB and can't go as long without o2 as she used to but states this is about the same.  Also, reports a tightness from her neck to her chest at times, a "bad" dry cough that is worse qhs, and a runny nose with clear drainage.  Using promethazine codiene cough syrup with some relief and taking claritin approx qod.  Pt is aware CDY recs she see a Neurology for her "dizzy spells" but wants to know if he will recommend one as her PCP is off.  Dr. Annamaria Boots, do you have any recs on a neurology dr?

## 2012-07-22 NOTE — Telephone Encounter (Signed)
Spoke with pt and notified of recs per CDY She states prefers to make the appt herself She just wanted the name of neurologist that CDY would rec Please advise thanks!

## 2012-07-23 DIAGNOSIS — H4011X0 Primary open-angle glaucoma, stage unspecified: Secondary | ICD-10-CM | POA: Diagnosis not present

## 2012-07-23 DIAGNOSIS — H04129 Dry eye syndrome of unspecified lacrimal gland: Secondary | ICD-10-CM | POA: Diagnosis not present

## 2012-07-23 DIAGNOSIS — E119 Type 2 diabetes mellitus without complications: Secondary | ICD-10-CM | POA: Diagnosis not present

## 2012-07-23 NOTE — Telephone Encounter (Signed)
Pt returned calls.  I advised her of CY's recs about calling neuro and making appt.  She is insisting on a dr's name. Dion Saucier

## 2012-07-23 NOTE — Telephone Encounter (Signed)
ATC line busy x 3 wcb 

## 2012-07-23 NOTE — Telephone Encounter (Signed)
Called, spoke with pt.  I explained below to her per CDY.  Advised CDY recs she call [facility] and ask for an appt with the next available nuerologist for "dizzy spells."  She verbalized understanding of this, states she will call [facility] now, and voiced no further questions or concerns at this time.  She will call back with any further questions or concerns.

## 2012-08-05 ENCOUNTER — Telehealth: Payer: Self-pay | Admitting: Internal Medicine

## 2012-08-05 DIAGNOSIS — J449 Chronic obstructive pulmonary disease, unspecified: Secondary | ICD-10-CM

## 2012-08-05 NOTE — Telephone Encounter (Signed)
Spoke with pt and notified of recs per CDY She verbalized understanding and nothing further needed Order was sent to [facility]

## 2012-08-05 NOTE — Telephone Encounter (Signed)
Per CY-ONO on her O2 Dx COPD.

## 2012-08-05 NOTE — Telephone Encounter (Signed)
I spoke with pt and she stated her finger/nails are purple looking x last week. She called her PCP and was advised it could be d/t her breathing and was told to call our office. Per pt her SOB is not any worse. She is wearing her O2 24/7. She stated her hands did feel like ice so she ran them under warm water and it did help some in color but not much. Pt does not have anything to check her O2 sats. Pt requesting recs. Please advise Dr. Annamaria Boots thanks Last OV 04/23/12 Pending 10/22/12

## 2012-08-07 ENCOUNTER — Telehealth: Payer: Self-pay | Admitting: Internal Medicine

## 2012-08-07 NOTE — Telephone Encounter (Signed)
[facility], has the ONO placed on 08-05-12 been faxed yet? The pt states she is going out of town at the end of the week and wants ONO done before then. Please advise. [facility]. Pt is aware that we are working on this. Colby Bing, CMA

## 2012-08-09 ENCOUNTER — Encounter: Payer: Self-pay | Admitting: Neurology

## 2012-08-09 ENCOUNTER — Ambulatory Visit (INDEPENDENT_AMBULATORY_CARE_PROVIDER_SITE_OTHER): Payer: Medicare Other | Admitting: Neurology

## 2012-08-09 VITALS — BP 154/81 | HR 98 | Temp 97.8°F | Ht 63.0 in | Wt 198.0 lb

## 2012-08-09 DIAGNOSIS — E1149 Type 2 diabetes mellitus with other diabetic neurological complication: Secondary | ICD-10-CM | POA: Diagnosis not present

## 2012-08-09 DIAGNOSIS — H8110 Benign paroxysmal vertigo, unspecified ear: Secondary | ICD-10-CM

## 2012-08-09 DIAGNOSIS — E1142 Type 2 diabetes mellitus with diabetic polyneuropathy: Secondary | ICD-10-CM | POA: Diagnosis not present

## 2012-08-09 DIAGNOSIS — E1140 Type 2 diabetes mellitus with diabetic neuropathy, unspecified: Secondary | ICD-10-CM

## 2012-08-09 HISTORY — DX: Type 2 diabetes mellitus with diabetic neuropathy, unspecified: E11.40

## 2012-08-09 HISTORY — DX: Benign paroxysmal vertigo, unspecified ear: H81.10

## 2012-08-09 NOTE — Progress Notes (Signed)
Subjective:    Patient ID: [name] is a 77 y.o. female.  HPI Star Age, MD, PhD [facility] [address] [place], [place] [address]  Dear Dr. Dwyane Dee, I saw your patient, [name], upon your kind request in my neurologic clinic today for initial consultation of her dizziness and vertigo. The patient is unaccompanied today. As you know, [name] is a very pleasant 77 year old right-handed woman with an underlying medical history of chronic lung disease, arthritis, sciatica, diverticulosis, irritable bowel syndrome, glaucoma, vitamin D deficiency, reflux disease, hyperlipidemia and type 2 diabetes who was recently seen in the emergency room on 07/19/2012 with new or recurrence of dizziness. She was seen by Dr. Christ Kick at the time. She had a normal head CT. She did have some elevated blood pressure values lately and her sugar levels are often high in the 200s. Her dizziness is described as sudden in onset with a sense of spinning. She did have some chest pain and trouble breathing at the time of the last attack. This happened while driving. She herself called EMS after she got home. She did manage to pull to a parking lot and then made it home. At home, her BP was 485 systolic. At that time she denied any headache, one-sided weakness, paresthesias, numbness, facial droop, slurring of speech. She had breakfast in the morning but skipped lunch. She had no preceding illnesses and no fevers or chills. He in the emergency room her blood pressure was 119/68. Her EKG showed right bundle branch block and left posterior fascicular block. Her CT head report was reviewed by me which showed mild chronic microvascular ischemia and no acute abnormality. Dr. Einar Gip is her cardiologist. She is supposed to have a nuclear stress test, but has not been able to go through with it yet.  Her original vertigo episode started in 2001 and over the years she has had  episodes off an on, lasting for hours, associated with nausea a few times, but no vomiting. She had cubital tunnel release in 10/13. She had not had vertigo for a few months until mid-March.  She sleeps well, goes to bed between 10-11:45 and falls asleep quickly. She is not reported to snore. She denies choking sensations. She endorses fatigue. She naps nearly every day. She moved [place] a [place] apartment in a retirement community. She is divorced since 2001. She has no children.  She was recently seen by ENT, Dr. Benjamine Mola; her hearing was fine.  She had her eyes checked recently as well.   Her Past Medical History Is Significant For: Past Medical History  Diagnosis Date  . Irritable bowel syndrome   . Other chronic nonalcoholic liver disease   . Esophageal reflux   . Allergic rhinitis, cause unspecified     Sinus CT Rec 12-23-2009  . 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 Sciatica   . Shingles 2010  . Steatohepatitis   . Diabetes mellitus   . Diverticulosis   . Hiatal hernia   . Esophageal stricture   . Diarrhea   . GERD (gastroesophageal reflux disease)   . Shortness of breath   . Hypertension   . Arthritis   . Glaucoma   . Complication of anesthesia     takes along time to wake up  . Oxygen deficiency   . Benign paroxysmal positional vertigo 08/09/2012    Her Past Surgical History Is Significant  For: Past Surgical History  Procedure Laterality Date  . Cholecystectomy open  1978  . Liver biopsy  08-1992  . Parathyroid exploration    . Tonsillectomy and adenoidectomy    . Total abdominal hysterectomy    . Esophagogastroduodenoscopy  1610,96-04    H Hernia,es.stricture s/p dil 52F  . Colonoscopy  07-2001    mild diverticulosis  . Carpal tunnel release      right hand  . Cataract extraction      Bilateral  . Ulnar nerve transposition  12/07/2011    Procedure: ULNAR NERVE DECOMPRESSION/TRANSPOSITION;this was cancelled-not done   Surgeon: Cammie Sickle., MD;  Location: [facility];  Service: Orthopedics;  Laterality: Right;  right ulnar nerve in situ decompression  . Ulnar tunnel release  03/07/2012    Procedure: CUBITAL TUNNEL RELEASE;  Surgeon: Roseanne Kaufman, MD;  Location: [facility];  Service: Orthopedics;  Laterality: Right;  ulnar nerve release at the elbow      Her Family History Is Significant For: Family History  Problem Relation Age of Onset  . Lung cancer Mother     small cell;Byssinosis  . Heart disease Father   . Lung cancer Sister   . Liver cancer Sister     ? mets from another area of the body  . Diabetes      grandmother  . Stroke Maternal Grandfather     Her Social History Is Significant For: History   Social History  . Marital Status: Single    Spouse Name: N/A    Number of Children: N/A  . Years of Education: N/A   Occupational History  . Retired     Social History Main Topics  . Smoking status: Former Smoker    Types: Cigarettes    Quit date: 05/08/1988  . Smokeless tobacco: Never Used  . Alcohol Use: No  . Drug Use: No  . Sexually Active: None   Other Topics Concern  . None   Social History Narrative  . None    Her Allergies Are:  Allergies  Allergen Reactions  . Clidinium-Chlordiazepoxide Other (See Comments)    Sleepy,weak  . Codeine     REACTION: gi upset  . Tramadol   :   Her Current Medications Are:  Outpatient Encounter Prescriptions as of 08/09/2012  Medication Sig Dispense Refill  . acetaminophen (TYLENOL) 500 MG tablet Take 500 mg by mouth every 6 (six) hours as needed for pain.       Marland Kitchen ALPRAZolam (XANAX) 0.5 MG tablet Take 0.25 mg by mouth at bedtime as needed for anxiety.       . Alum & Mag Hydroxide-Simeth (MAGIC MOUTHWASH) SOLN Take 10 mLs by mouth daily as needed (as needed to help with acid reflux per Dr. Delfin Edis).      Marland Kitchen aspirin 325 MG tablet Take 325 mg by mouth daily.        . B-D INS SYRINGE  0.5CC/30GX1/2" 30G X 1/2" 0.5 ML MISC       . candesartan (ATACAND) 32 MG tablet Take 16 mg daily      . cholestyramine (QUESTRAN) 4 G packet Take 1 packet by mouth daily as needed (for bowel problems.). Take 1 hour apart from all other medication      . dicyclomine (BENTYL) 10 MG capsule Take 10 mg by mouth daily as needed.        . insulin aspart (NOVOLOG PENFILL) 100 UNIT/ML injection Inject 0-12 Units into the skin  3 (three) times daily before meals. SSI      . insulin glargine (LANTUS) 100 UNIT/ML injection 29-38 Units daily. Inject 38 units every morning and 29 units at supper time.      Marland Kitchen ipratropium (ATROVENT HFA) 17 MCG/ACT inhaler Inhale 2 puffs into the lungs 2 (two) times daily as needed (shortness of breath).      . Loperamide HCl (IMODIUM PO) Take 1 tablet by mouth daily as needed (loose stool.).       Marland Kitchen meclizine (ANTIVERT) 25 MG tablet Take 25 mg by mouth 3 (three) times daily as needed for dizziness.       . metoprolol succinate (TOPROL-XL) 25 MG 24 hr tablet Take 25 mg by mouth daily.      Marland Kitchen omeprazole (PRILOSEC) 40 MG capsule Take 40 mg by mouth daily.       . promethazine-codeine (PHENERGAN WITH CODEINE) 6.25-10 MG/5ML syrup Take 5 mLs by mouth every 6 (six) hours as needed for cough.  200 mL  0  . tiotropium (SPIRIVA HANDIHALER) 18 MCG inhalation capsule Place 1 capsule (18 mcg total) into inhaler and inhale daily.  30 capsule  prn  . Vitamin D, Ergocalciferol, (DRISDOL) 50000 UNITS CAPS Take 50,000 Units by mouth every 7 (seven) days. Sunday.      . pravastatin (PRAVACHOL) 40 MG tablet       . timolol (TIMOPTIC) 0.5 % ophthalmic solution Place 1 drop into the left eye 2 (two) times daily.        No facility-administered encounter medications on file as of 08/09/2012.  :   Review of Systems  Constitutional: Positive for chills and fatigue.  HENT: Positive for trouble swallowing.   Eyes: Positive for visual disturbance (blurred vision).  Respiratory: Positive for cough,  shortness of breath and wheezing.        Shortness of breath  Gastrointestinal:       Diarrhea  Musculoskeletal: Positive for myalgias and joint swelling.  Neurological: Positive for dizziness.    Objective:  Neurologic Exam  Physical Exam Physical Examination:   Filed Vitals:   08/09/12 0821  BP: 154/81  Pulse: 98  Temp:     General Examination: The patient is a very pleasant 77 y.o. female in no acute distress.  HEENT: Normocephalic, atraumatic, pupils are equal, round and reactive to light and accommodation. Funduscopic exam is normal with sharp disc margins noted. Extraocular tracking is good without nystagmus noted. On Nylan-Barany maneuver she has no vertiginous symptoms and no nystagmus is seen. Normal smooth pursuit is noted. Hearing is grossly intact. Tympanic membranes are clear bilaterally. Face is symmetric with normal facial animation and normal facial sensation. Speech is clear with no dysarthria noted. There is no hypophonia. There is no lip, neck or jaw tremor. Neck is supple with full range of motion. There are no carotid bruits on auscultation. Oropharynx exam reveals normal findings. Mild airway crowding is noted. Mallampati is class II. Tongue protrudes centrally and palate elevates symmetrically. Tonsils are absent. She is using oxygen via nasal cannula at 3 L per minute. She is not tachypneic or complaining of shortness of breath.  Chest: is clear to auscultation without wheezing, rhonchi or crackles noted.  Heart: sounds are regular and normal without murmurs, rubs or gallops noted.   Abdomen: is soft, non-tender and non-distended with normal bowel sounds appreciated on auscultation.  Extremities: There is no pitting edema in the distal lower extremities bilaterally. Pedal pulses are intact.  Skin: is warm and dry  with no trophic changes noted.  Musculoskeletal: exam reveals no obvious joint deformities, tenderness or joint swelling or  erythema.  Neurologically:  Mental status: The patient is awake, alert and oriented in all 4 spheres. Her memory, attention, language and knowledge are appropriate. There is no aphasia, agnosia, apraxia or anomia. Speech is clear with normal prosody and enunciation. Thought process is linear. Mood is congruent and affect is normal. Cranial nerves are as described above under HEENT exam. In addition, shoulder shrug is normal with equal shoulder height noted. Motor exam: Normal bulk, strength and tone is noted. There is no drift, tremor or rebound. Romberg is negative. Reflexes are 2+ in the upper extremities, 1-2+ in her knees, absent in her ankles. Fine motor skills are intact.  Cerebellar testing shows no dysmetria or intention tremor. There is no truncal or gait ataxia.  Sensory exam is intact to light touch, pinprick, vibration, temperature sense in the upper extremities, but she does have decreased vibration, pinprick and temperature sense in the distal lower extremities up to mid shin area..  Gait, station and balance is remarkable for cautious gait and she does limp on the left with pain reported in her knee. No veering to one side is noted. No leaning to one side. Posture is age-appropriate and stance is narrow based. No problems turning are noted. She turns en bloc. Tandem walk is not possible. She cannot do toe and heel stance. She has preserved arm swing.              Assessment and Plan:   Assessment and Plan:  In summary, SIRIAH TREAT is a very pleasant 77 y.o.-year old female with a history of recurrent vertigo. This is most likely benign positional vertigo. She is not orthostatic on examination but her blood pressure is on the higher side. She is advised that she has a reassuringly nonfocal neurologic exam at this time. I would like to suggest vestibular rehab. She is advised about the possibility that it may recur. She is advised about changing positions slowly and to keep herself  well hydrated and to start outpatient physical therapy. She is advised to maintain a healthy lifestyle in general.. I encouraged the patient to eat healthy, exercise daily and keep well hydrated, to keep a scheduled bedtime and wake time routine, to not skip any meals and eat healthy snacks in between meals and to have protein with every meal.     I answered all her questions today and the patient was in agreement with the plan. I would like to see her back in 3 months, sooner if the need arises and encouraged to call with any interim questions, concerns, problems or updates.   Thank you very much for allowing me to participate in the care of this nice patient. If I can be of any further assistance to you please do not hesitate to call me at [phone].  Sincerely,   Star Age, MD, PhD

## 2012-08-09 NOTE — Telephone Encounter (Signed)
Spoke to [email or web address]  and she did get this order and will call pt asap [name]

## 2012-08-09 NOTE — Patient Instructions (Addendum)
I think overall you are doing fairly well and are stable at this point, but I think you will benefit from physical therapy.   I do have some generic suggestions for you today:   Please make sure that you drink plenty of fluids. I would like for you to exercise daily for example in the form of walking 20-30 minutes every day, if you can. Please keep a regular sleep-wake schedule, keep regular meal times, do not skip any meals, eat  healthy snacks in between meals, such as fruit or nuts. Try to eat protein with every meal.   Engage in social activities in your community and with your family and try to keep up with current events by reading the newspaper or watching the news.  I do not think we need to make any changes in your medications at this point. I will see you back in 3 months, sooner if we need to. Please call us if you have any interim questions, concerns, or problems or updates to need to discuss.  Richardson Landry is my clinical assistant and will answer any of your questions and relay your messages to me and will give you my messages.   Our phone number is [phone]. We also have an after hours call service for urgent matters and there is a physician on-call for urgent questions. For any emergencies you know to call 911 or go to the nearest emergency room.

## 2012-08-12 ENCOUNTER — Telehealth: Payer: Self-pay | Admitting: Internal Medicine

## 2012-08-12 NOTE — Telephone Encounter (Signed)
Spoke with pt  She states that [facility] has contacted her regarding ONO and nothing further needed

## 2012-08-14 ENCOUNTER — Telehealth: Payer: Self-pay | Admitting: Internal Medicine

## 2012-08-14 DIAGNOSIS — E1149 Type 2 diabetes mellitus with other diabetic neurological complication: Secondary | ICD-10-CM | POA: Diagnosis not present

## 2012-08-14 DIAGNOSIS — E1142 Type 2 diabetes mellitus with diabetic polyneuropathy: Secondary | ICD-10-CM | POA: Diagnosis not present

## 2012-08-14 DIAGNOSIS — K5900 Constipation, unspecified: Secondary | ICD-10-CM | POA: Diagnosis not present

## 2012-08-14 DIAGNOSIS — IMO0001 Reserved for inherently not codable concepts without codable children: Secondary | ICD-10-CM | POA: Diagnosis not present

## 2012-08-14 DIAGNOSIS — G459 Transient cerebral ischemic attack, unspecified: Secondary | ICD-10-CM | POA: Diagnosis not present

## 2012-08-14 LAB — PULMONARY FUNCTION TEST

## 2012-08-14 MED ORDER — AMOXICILLIN 500 MG PO CAPS: 500.0000 mg | ORAL_CAPSULE | Freq: Three times a day (TID) | ORAL | Status: DC

## 2012-08-14 NOTE — Telephone Encounter (Signed)
Pt returned call. [name]  

## 2012-08-14 NOTE — Telephone Encounter (Signed)
I spoke with pt. She c/o cough w/ green phlem, nasal congestion, PND, runny nose, chest tx, SOB sightly increase x 1 week. She is using 3-3.5 liters 02. She uses Claritin PRN. Pt requesting ABX. Please advise Dr. Annamaria Boots thanks Last OV 04/23/12 Pending OV 10/22/12 Allergies  Allergen Reactions  . Clidinium-Chlordiazepoxide Other (See Comments)    Sleepy,weak  . Codeine     REACTION: gi upset  . Tramadol

## 2012-08-14 NOTE — Telephone Encounter (Signed)
lmomtcb x1 

## 2012-08-14 NOTE — Telephone Encounter (Signed)
Per CY-okay to give Amoxicillin 500 mg #21 take 1 po TID no refills.

## 2012-08-14 NOTE — Telephone Encounter (Signed)
I spoke with pt and is aware CDY recs. RX has been called in. Nothing further was needed

## 2012-08-20 ENCOUNTER — Other Ambulatory Visit: Payer: Self-pay | Admitting: Endocrinology

## 2012-08-20 ENCOUNTER — Other Ambulatory Visit (HOSPITAL_COMMUNITY): Payer: Medicare Other

## 2012-08-20 DIAGNOSIS — G459 Transient cerebral ischemic attack, unspecified: Secondary | ICD-10-CM

## 2012-08-21 ENCOUNTER — Ambulatory Visit (HOSPITAL_COMMUNITY)
Admission: RE | Admit: 2012-08-21 | Discharge: 2012-08-21 | Disposition: A | Discharge status: Home / Self Care | Payer: Medicare Other | Source: Ambulatory Visit | Attending: Endocrinology | Admitting: Endocrinology

## 2012-08-21 ENCOUNTER — Other Ambulatory Visit (HOSPITAL_COMMUNITY): Payer: Self-pay | Admitting: Endocrinology

## 2012-08-21 DIAGNOSIS — R42 Dizziness and giddiness: Secondary | ICD-10-CM | POA: Insufficient documentation

## 2012-08-21 DIAGNOSIS — Z8673 Personal history of transient ischemic attack (TIA), and cerebral infarction without residual deficits: Secondary | ICD-10-CM | POA: Diagnosis not present

## 2012-08-21 DIAGNOSIS — G459 Transient cerebral ischemic attack, unspecified: Secondary | ICD-10-CM

## 2012-08-21 NOTE — Progress Notes (Signed)
VASCULAR LAB PRELIMINARY  PRELIMINARY  PRELIMINARY  PRELIMINARY  Carotid duplex completed.    Preliminary report:  Bilateral:  No evidence of hemodynamically significant internal carotid artery stenosis.   Vertebral artery flow is antegrade.     Gary Gabrielsen, RVS 08/21/2012, 5:04 PM

## 2012-08-22 ENCOUNTER — Telehealth: Payer: Self-pay | Admitting: Internal Medicine

## 2012-08-22 DIAGNOSIS — I6350 Cerebral infarction due to unspecified occlusion or stenosis of unspecified cerebral artery: Secondary | ICD-10-CM | POA: Diagnosis not present

## 2012-08-22 NOTE — Telephone Encounter (Signed)
Per CY: have not seen this yet.  Thanks.  Called [facility], spoke with Bridgett who reported that it has not yet been faxed to this office Will ensure this is done today Requested that it be sent to the triage fax machine Will hold in triage to await fax

## 2012-08-22 NOTE — Telephone Encounter (Signed)
I spoke with pt and she stated she had ONO done last week and is requesting these results. Please advise Dr. Annamaria Boots thanks Last OV 04/23/12 Pending OV 10/22/12

## 2012-08-26 NOTE — Telephone Encounter (Signed)
Spoke with Joellen Jersey, ONO has been received will re-route as a reminder. Thanks

## 2012-08-27 ENCOUNTER — Telehealth: Payer: Self-pay | Admitting: Internal Medicine

## 2012-08-27 NOTE — Telephone Encounter (Signed)
Spoke with patient in regards to ONO results. Patient is wanting to know what is taking so long; said she spoke with someone last week and they told her these were being requested again from [facility]. Pt would like a call back ASAP.  Katie, have you seen these results? I did not see the results in your box.

## 2012-08-27 NOTE — Telephone Encounter (Signed)
Per CY-she desat for 30 minutes despite her O2; ok to sleep on 3.5 L/M. I have attempted to call patient 4 times and line remains busy. No other phone numbers on file for patient. Will call again Wednesday 08-28-12 AM.

## 2012-08-27 NOTE — Telephone Encounter (Signed)
I just results this afternoon; have placed on CY's cart with message to review and advise.

## 2012-08-28 NOTE — Telephone Encounter (Signed)
The shakiness sounds more like hypoglycemia. I wouldn't expect "near blacking out" to be from inner ear.

## 2012-08-28 NOTE — Telephone Encounter (Signed)
Pt states that she has already spoken with Joellen Jersey and is aware of these results

## 2012-08-28 NOTE — Telephone Encounter (Signed)
I called and spoke with patient this morning about this matter-patient states she had already been told by CY to use her O2 at 3-4 L/M prn for sleep; pt then stated that she noticed that occasionally she wakes up with nasal cannula out of her nose. Pt would like to get recs from [place] as far as why she is having dizzy spells and feelings of blacking out(although she had never completely blacked out). Pt states most recently she was driving her car and got feelings of shakiness/tremors and stopped her car for a few minutes-she then drove home where she continued to feel weak and uneasy-she called 911 and was taken to ER to assess her heart as pt says that EMT told her that her heart didn't sound right. Pt has been to her Cardiologist,Neurologist, Eye MD, ENT MD, and all gave her good reports. Pt feels this could all be coming from her inner ear. CY please advise. Thanks.

## 2012-08-28 NOTE — Telephone Encounter (Signed)
Spoke with patient-aware of answer from [place]. Nothing more needed at this time. Will sign off on message.

## 2012-08-29 NOTE — Telephone Encounter (Signed)
Pt is aware of ONO results via 08-27-12 phone note. Will sign off on message.

## 2012-09-02 ENCOUNTER — Telehealth: Payer: Self-pay | Admitting: Internal Medicine

## 2012-09-02 DIAGNOSIS — R0902 Hypoxemia: Secondary | ICD-10-CM

## 2012-09-02 MED ORDER — CLARITHROMYCIN 500 MG PO TABS: 500.0000 mg | ORAL_TABLET | Freq: Two times a day (BID) | ORAL | Status: DC

## 2012-09-02 NOTE — Telephone Encounter (Signed)
Offer biaxin 500 mg, # 14, 1 twice daily, with meals

## 2012-09-02 NOTE — Telephone Encounter (Signed)
I spoke with pt and is aware of CDY recs. RX has been sent. Nothing further was needed

## 2012-09-02 NOTE — Telephone Encounter (Signed)
lmomtcb x1 

## 2012-09-02 NOTE — Telephone Encounter (Signed)
Spoke with pt She states that her cough is worse after taking abx  We had called her in amox on 08/14/12 and she completed the whole course She states cough is prod with large amounts of green sputum  Also having increased SOB I offered ov and she refused, stating, "I don't see what good that would do" Last ov was in Dec 2013 She is also c/o running out of o2 at hs since she is now on 3 lpm For this, I send order to [facility] to have DME access her o2 needed Please advise if you wish to call in something thanks! Allergies  Allergen Reactions  . Clidinium-Chlordiazepoxide Other (See Comments)    Sleepy,weak  . Codeine     REACTION: gi upset  . Tramadol

## 2012-09-03 NOTE — Telephone Encounter (Signed)
Not able to reach [place] from the phone in [place] clinic for some reason Called and spoke with pt about the msg she had left this am She states that she is needing a different abx called in than the biaxin  She started this med yesterday and has had abd pain, diarrhea and very foul taste in her mouth I will add to lost of intolerances She also states only has a few drops of her prometh/cod syrup left and wants a refill  Please advise thanks! Allergies  Allergen Reactions  . Clidinium-Chlordiazepoxide Other (See Comments)    Sleepy,weak  . Biaxin (Clarithromycin)     Foul taste, abd pain, diarrhea  . Codeine     REACTION: gi upset  . Tramadol

## 2012-09-03 NOTE — Telephone Encounter (Signed)
Pt aware of recs per CDY. Expressed understanding--will let us know in the next day or so how she is doing.  Pt is requesting Rx cough syrup again. States that she has called up here multiple times and has not gotten a response in regards to the syrup...she also asked for the syrup when she called earlier but it was not addressed in the response by CDY.   promethazine-codeine (PHENERGAN WITH CODEINE) 6.25-10 MG/5ML syrup  22m every 6 hrs prn cough --- #2026m Please advise Dr YoAnnamaria Bootsf you would like patient to have refill of this cough syrup. Thanks.  Patient aware that CDY out of office and will return in the morning--okay with waiting until then.

## 2012-09-03 NOTE — Telephone Encounter (Signed)
Melissa from [facility] returning call can be reached a [phone].Elnita Maxwell

## 2012-09-03 NOTE — Telephone Encounter (Signed)
Per Dr. Annamaria Boots To stop the abx and let us know tomorrow or the day after how she is doing.  --note Dr. Annamaria Boots has no left.

## 2012-09-03 NOTE — Telephone Encounter (Signed)
Patient states she is having stomach cramps and foul mouth from abx.  She is not going to take any longer.  Requesting a different type of abx.  [facility]   Also needing cough syrup rx.

## 2012-09-03 NOTE — Telephone Encounter (Signed)
ATC pt line busy x 4 wcb 

## 2012-09-04 ENCOUNTER — Telehealth: Payer: Self-pay | Admitting: Internal Medicine

## 2012-09-04 MED ORDER — PROMETHAZINE-CODEINE 6.25-10 MG/5ML PO SYRP: 5.0000 mL | ORAL_SOLUTION | Freq: Four times a day (QID) | ORAL | Status: DC | PRN

## 2012-09-04 NOTE — Telephone Encounter (Signed)
Spoke to pt she is aware i have sent a new message to [facility] to do a best fit for pt's 02 needs Joellen Jersey

## 2012-09-04 NOTE — Telephone Encounter (Signed)
Pt calling again in ref to previous msg.[name] ° ° °

## 2012-09-04 NOTE — Telephone Encounter (Signed)
Spoke with patient-states that she just hung up with [place] stating that she gave order to [facility] to assess the patients needs for O2; pt states she does not want the concentrator that CY gave verbal to give patient. CY suggested that patient get concentrator and just wear ear plugs at sleep. Nothing more needed at this time. Will sign off on message.

## 2012-09-04 NOTE — Telephone Encounter (Signed)
Cough med refilled Pt aware  Spoke with Melissa with [facility] She states that the reason that the pt is running out of o2 at night is b/c she is using a helios tank and it of course does not last through the night She states that the pt is not wanting to use a concentrator since it is too loud  She is also using o2 during the day prn and Melissa does not have an order for this CDY please advise what to do about o2 thanks

## 2012-09-04 NOTE — Telephone Encounter (Signed)
Per CY-okay to refill cough syrup.

## 2012-09-11 ENCOUNTER — Telehealth: Payer: Self-pay | Admitting: Internal Medicine

## 2012-09-11 MED ORDER — TIOTROPIUM BROMIDE MONOHYDRATE 18 MCG IN CAPS: 18.0000 ug | ORAL_CAPSULE | Freq: Every day | RESPIRATORY_TRACT | Status: DC

## 2012-09-11 NOTE — Telephone Encounter (Signed)
I spoke with pt and is aware RX has been sent.

## 2012-09-24 DIAGNOSIS — R0902 Hypoxemia: Secondary | ICD-10-CM | POA: Diagnosis not present

## 2012-09-24 DIAGNOSIS — J449 Chronic obstructive pulmonary disease, unspecified: Secondary | ICD-10-CM | POA: Diagnosis not present

## 2012-09-27 ENCOUNTER — Encounter: Payer: Self-pay | Admitting: Internal Medicine

## 2012-09-27 DIAGNOSIS — M5430 Sciatica, unspecified side: Secondary | ICD-10-CM | POA: Diagnosis not present

## 2012-10-04 DIAGNOSIS — R339 Retention of urine, unspecified: Secondary | ICD-10-CM | POA: Diagnosis not present

## 2012-10-16 ENCOUNTER — Encounter: Payer: Self-pay | Admitting: Internal Medicine

## 2012-10-22 ENCOUNTER — Ambulatory Visit (INDEPENDENT_AMBULATORY_CARE_PROVIDER_SITE_OTHER): Payer: Medicare Other | Admitting: Internal Medicine

## 2012-10-22 ENCOUNTER — Encounter: Payer: Self-pay | Admitting: Internal Medicine

## 2012-10-22 VITALS — BP 124/58 | HR 78 | Ht 63.0 in | Wt 196.4 lb

## 2012-10-22 DIAGNOSIS — J449 Chronic obstructive pulmonary disease, unspecified: Secondary | ICD-10-CM | POA: Diagnosis not present

## 2012-10-22 DIAGNOSIS — E1142 Type 2 diabetes mellitus with diabetic polyneuropathy: Secondary | ICD-10-CM | POA: Diagnosis not present

## 2012-10-22 DIAGNOSIS — E1149 Type 2 diabetes mellitus with other diabetic neurological complication: Secondary | ICD-10-CM

## 2012-10-22 DIAGNOSIS — E1140 Type 2 diabetes mellitus with diabetic neuropathy, unspecified: Secondary | ICD-10-CM

## 2012-10-22 MED ORDER — DIAZEPAM 2 MG PO TABS: 2.0000 mg | ORAL_TABLET | Freq: Four times a day (QID) | ORAL | Status: DC | PRN

## 2012-10-22 NOTE — Progress Notes (Signed)
Patient ID: [name], female    DOB: [birth year], 77 y.o.   MRN: [id number]  HPI SATURATION QUALIFICATIONS: Patient Saturations on Room Air while Ambulating = 84% Patient Saturations on 2.5 Liters of oxygen while Ambulating = 92%[name],CMA  09/28/10-  75 yoF former smoker with COPD, complicated by chronic rhinitis, GERD. Last here 04/29/10- note reviewed. Acute visit.-- Woke with soreness across under her breasts. Losing voice. Coughing spells, with much liquid coming up and from nose. Prior to that hadn't been coughing. C/o weak, dizzy, light headed- relieved by her oxygen. . Moving into a smaller apartment. Denies fever. Easy DOE short walks room to room.   10/31/10- 65 yoF former smoker with COPD, complicated by chronic rhinitis, GERD. She denies major change since last here. Has to stay in to avoid heat and on her oxygen much of the time in the house. Coughs scant clear or lemon yellow, with some hoarseness which comes and goes. She describes hoarseness as worse with phone conversations, variable but rarely if ever clear. She had seen Dr [name] years ago for epistaxis. Easy choke with eating. Discussed aspiration precautions, chin tuck.   02/26/11- 40 yoF former smoker with COPD, complicated by chronic rhinitis, GERD. Has had flu vaccine. No coughing through the summer. We first cool days as she stepped outside she would begin to cough again. Dry hacking cough. Very aware of active reflux anytime she bends over, regurgitating recently swallowed food. Has also had some diarrhea. Complains of spasms intermittently left lower anterior chest and left upper quadrant. It may all be GI. She thinks it is part of her diabetic neuropathy. Has appointment soon with Dr.Brodie.  08/16/11-75 yoF former smoker with COPD, complicated by chronic rhinitis, GERD. During the winter had prolonged diarrhea evaluated by Dr [name] and apparently attributed to her diabetic neuropathy. May have had  esophagitis-was told to take Magic mouthwash. Gradually easier dyspnea on exertion. Now on oxygen 2-3 L most of the time. She came today with acute complaint of aching pain intermittently for the past several weeks, dull. Associated with mid back at strap level. Pain is gone right now. She saw no effect of activity or position and said pain was nonpleuritic or radiating.  CXR- 08/16/11- reviewed w/ her. NAD with no acute process.   08/28/11-75 yoF former smoker with COPD, complicated by chronic rhinitis, GERD,  DM. Back pain comes and goes. She implies that it is better today and says that she really has been present a long time as part of her degenerative back pain. Complains of feeling sluggish and tired. Recent bronchitis with productive green sputum watery nose no fever. Using Atrovent. Intolerant of albuterol and Symbicort because of overstimulation. Home oxygen is 2-1/2-3 L. CXR 08/15/11-  IMPRESSION:  Hyperinflation.  No active cardiopulmonary disease.  Original Report Authenticated By: [name], M.D.   12/28/11- 55 yoF former smoker with COPD, complicated by chronic rhinitis, GERD,  DM. Not doing well-stays on O2 all the time; since being on Spiriva at first did great but has noticed having to stop more and rest during the day(nap) She  stays indoors most of the time without many friends to do things with. She often has self-limited sharp twinges right anterior chest wall and left infrascapular back. With her known degenerative disc disease, we discussed the possibility this was nerve root irritation. Spiriva helps variable cough.  04/23/12- 4 yoF former smoker with COPD, complicated by chronic rhinitis, GERD,  DM. FOLLOWS FOR: still  has good and bad days with SOB and activity levels Watery rhinorrhea. No acute problems. No respiratory problems associated with surgery for nerve impingement in her right hand. He continues oxygen 3 or 3 a half liters/Advanced PFT: 01/16/2012 severe  obstructive airways disease with insignificant response to bronchodilator, air trapping, diffusion moderately reduced. FEV1 0.82/45%, FEV1/FEC 0.46. Emphysema pattern on the loop. TLC 100%, RV 148%, DLCO 47%.  10/22/12- 68 yoF former smoker with COPD, complicated by chronic rhinitis, GERD,  DM FOLLOWS FOR:has to pace herself more; continues to have nervous spell(could be from DM) SOB pretty much all the time Episodic shaky spells for which she says she's had evaluation by neurology, ENT, primary physician and ER. "Electric shock sensation" from her known neuropathy. Stress makes all of this worse. Breathing has not really changed. We talked about the possibility she was having a vasomotor/autonomic neuropathy related to her diabetes  Review of Systems-see HPI Constitutional:   No-   weight loss, night sweats, fevers, chills,  +fatigue, lassitude. HEENT:   No-  headaches, difficulty swallowing, tooth/dental problems, sore throat,       No-  sneezing, itching, ear ache, nasal congestion, post nasal drip,  CV:  + Atypical chest pains, no-orthopnea, PND, swelling in lower extremities, anasarca, dizziness, palpitations Resp: + shortness of breath with exertion or at rest.              No-   productive cough,  + non-productive cough,  No- coughing up of blood.              No-   change in color of mucus.  No- wheezing.   Skin: No-   rash or lesions. GI:  No-   heartburn, indigestion,   +abdominal pain, nausea, vomiting,   +diarrhea,  GU:  MS:  No-   joint pain or swelling.    + back pain. Neuro-     nothing unusual Psych:  No- change in mood or affect. + depression or anxiety.  No memory loss.    Objective:   Physical Exam Very similar to recent examinations: General- Alert, Oriented, Affect-animated, cheerful, Distress- none acute. Talkative.  on O2 2.5 L Skin- rash-none, lesions- none, excoriation- none Lymphadenopathy- none Head- atraumatic            Eyes- Gross vision intact, PERRLA,  conjunctivae clear secretions            Ears- Hearing, canals-normal            Nose- Clear, no-Septal dev, mucus, polyps, erosion, perforation             Throat- Mallampati II , mucosa clear , drainage- none, tonsils- atrophic Neck- flexible , trachea midline, no stridor , thyroid nl, carotid no bruit Chest - symmetrical excursion , unlabored           Heart/CV- RRR , no murmur , no gallop  , no rub, nl s1 s2                           - JVD- none , edema- none, stasis changes- none, varices- none           Lung- +diminished, clear, wheeze- none, cough- none , dullness-none, rub- none           Chest wall- back is not tender Abd-  Br/ Gen/ Rectal- Not done, not indicated Extrem- cyanosis- none, clubbing, none, atrophy- none, strength- nl Neuro- grossly intact to  observation

## 2012-10-22 NOTE — Patient Instructions (Addendum)
Script diazepam- try for your "spells"   We can continue present meds and oxygen

## 2012-10-28 ENCOUNTER — Telehealth: Payer: Self-pay | Admitting: Internal Medicine

## 2012-10-28 DIAGNOSIS — R5383 Other fatigue: Secondary | ICD-10-CM | POA: Diagnosis not present

## 2012-10-28 DIAGNOSIS — R5381 Other malaise: Secondary | ICD-10-CM | POA: Diagnosis not present

## 2012-10-28 DIAGNOSIS — E559 Vitamin D deficiency, unspecified: Secondary | ICD-10-CM | POA: Diagnosis not present

## 2012-10-28 DIAGNOSIS — R42 Dizziness and giddiness: Secondary | ICD-10-CM | POA: Diagnosis not present

## 2012-10-28 DIAGNOSIS — M2550 Pain in unspecified joint: Secondary | ICD-10-CM | POA: Diagnosis not present

## 2012-10-28 DIAGNOSIS — I10 Essential (primary) hypertension: Secondary | ICD-10-CM | POA: Diagnosis not present

## 2012-10-28 DIAGNOSIS — F410 Panic disorder [episodic paroxysmal anxiety] without agoraphobia: Secondary | ICD-10-CM | POA: Diagnosis not present

## 2012-10-28 DIAGNOSIS — K5900 Constipation, unspecified: Secondary | ICD-10-CM | POA: Diagnosis not present

## 2012-10-28 DIAGNOSIS — IMO0001 Reserved for inherently not codable concepts without codable children: Secondary | ICD-10-CM | POA: Diagnosis not present

## 2012-10-28 MED ORDER — AZITHROMYCIN 250 MG PO TABS: ORAL_TABLET | ORAL | Status: DC

## 2012-10-28 NOTE — Telephone Encounter (Signed)
lmtcb x1 

## 2012-10-28 NOTE — Telephone Encounter (Signed)
Pt returned call. Kathleen W Perdue  

## 2012-10-28 NOTE — Telephone Encounter (Signed)
Per CY-okay to give patient Zpak # 1 take as directed no refills.

## 2012-10-28 NOTE — Telephone Encounter (Signed)
Called spoke with patient, advised CY okayed the requested zpak.  Pt verbalized her understanding and denied any further questions/concerns.  Rx sent to verified pharmacy.  Nothing further needed; will sign off.

## 2012-10-28 NOTE — Telephone Encounter (Signed)
Pt states has a productive cough greenish in color, wheezing, chest congestion, sob, denies any fever.  Pt requesting a zpak . Allergies  Allergen Reactions  . Clidinium-Chlordiazepoxide Other (See Comments)    Sleepy,weak  . Biaxin (Clarithromycin)     Foul taste, abd pain, diarrhea  . Codeine     REACTION: gi upset  . Tramadol    Dr Annamaria Boots please advise  Thank you

## 2012-11-01 DIAGNOSIS — H16049 Marginal corneal ulcer, unspecified eye: Secondary | ICD-10-CM | POA: Diagnosis not present

## 2012-11-01 DIAGNOSIS — H04129 Dry eye syndrome of unspecified lacrimal gland: Secondary | ICD-10-CM | POA: Diagnosis not present

## 2012-11-01 DIAGNOSIS — H16219 Exposure keratoconjunctivitis, unspecified eye: Secondary | ICD-10-CM | POA: Diagnosis not present

## 2012-11-06 NOTE — Assessment & Plan Note (Signed)
She may be having problems related to diabetic autonomic neuropathy causing strange sensations and shaking spells, but needs to watch out for hypoglycemia.  Plan-since she was here, we discussed letting her try for a low dose Valium

## 2012-11-06 NOTE — Assessment & Plan Note (Signed)
She has emphysema and some bronchitis with chronic hypoxic respiratory failure for which she is oxygen dependent.

## 2012-11-12 ENCOUNTER — Telehealth: Payer: Self-pay | Admitting: Endocrinology

## 2012-11-12 NOTE — Telephone Encounter (Signed)
To try changing morning Humalog 5 units instead of 7 tomorrow, also if the waking up sugar continues to be around 100 and may reduce evening Lantus by 2 units

## 2012-11-12 NOTE — Telephone Encounter (Signed)
Pt is aware of dose change. I instructed her to call back if she was still having problems.

## 2012-11-13 ENCOUNTER — Telehealth: Payer: Self-pay | Admitting: *Deleted

## 2012-11-13 NOTE — Telephone Encounter (Signed)
Pt is aware.  She will call back if she has any other problems

## 2012-11-13 NOTE — Telephone Encounter (Signed)
She needs to increase her Humalog by 3-4 units for readings over 200; if she has reduced her evening Lantus yesterday she can go back to the original dose. Also if sugars are high during the day tomorrow then increase her morning Lantus by 2 units May be rebounding from low sugars yesterday or from the infection

## 2012-11-13 NOTE — Telephone Encounter (Signed)
[name] called about her sugars, this morning at 7:20 when she woke up it was 249, 7:55 it was 260, 9:35 it was 270 and at 11:38 it was 220, she's upset that yesterday they were low and today they are high, she also states she has an abscessed left cornea and is on tobramycin 3% eye drops in the a.m and at night she's on erythromycin 5% ointment. Please advise

## 2012-11-14 ENCOUNTER — Ambulatory Visit: Payer: Medicare Other | Admitting: Neurology

## 2012-11-14 ENCOUNTER — Telehealth: Payer: Self-pay | Admitting: Endocrinology

## 2012-11-14 MED ORDER — CHOLESTYRAMINE LIGHT 4 G PO PACK: 4.0000 g | PACK | Freq: Two times a day (BID) | ORAL | Status: DC

## 2012-11-14 NOTE — Telephone Encounter (Signed)
Per Dr. Dwyane Dee, an rx for the diarrhea was sent in and patient is aware.

## 2012-11-22 ENCOUNTER — Other Ambulatory Visit: Payer: Self-pay | Admitting: *Deleted

## 2012-11-22 MED ORDER — DIPHENOXYLATE-ATROPINE 2.5-0.025 MG PO TABS: ORAL_TABLET | ORAL | Status: DC

## 2012-11-25 ENCOUNTER — Telehealth: Payer: Self-pay | Admitting: Internal Medicine

## 2012-11-25 DIAGNOSIS — J449 Chronic obstructive pulmonary disease, unspecified: Secondary | ICD-10-CM

## 2012-11-25 MED ORDER — LEVALBUTEROL TARTRATE 45 MCG/ACT IN AERO: 2.0000 | INHALATION_SPRAY | Freq: Four times a day (QID) | RESPIRATORY_TRACT | Status: DC | PRN

## 2012-11-25 NOTE — Telephone Encounter (Signed)
Ok to offer script Xopenex HFA # 1, 2 puffs every 6 hours if needed, ref prn. I think she may have felt this was too expensive a long time ago, but she might want to recheck.

## 2012-11-25 NOTE — Telephone Encounter (Signed)
Spoke with the pt and notified of recs per CDY  She verbalized understanding  Rx was sent to her pharm for xopenex

## 2012-11-25 NOTE — Telephone Encounter (Signed)
She has no inhalers listed on med list. Offer script albuterol HFA # 1, 2 puffs up to 4 times daily if needed  She can ask her DME to let her see a continuous portable O2 source so she can see if it would be too heavy.

## 2012-11-25 NOTE — Telephone Encounter (Signed)
Called spoke with patient who reports that her cough has not really improved since the 6.23.14 phone note where she received a zpak.  Pt states that her cough is now a dry hacking cough with minimal mucus production, she has increased SOB, chest tightness > the dyspnea and tightness began 3 days ago.  Denied f/c/s, wheezing.  Pt has been taking promethazine-codeine cough syrup prn w/ minimal relief.  Pt is also reporting that she feels like her helios is not providing adequate oxygen and would like CY's recs regarding switching to a continuous flow portable O2 system > does CY think a larger portable tank will be too heavy for her?  She does not have to carry the tank for long periods of time.    Dr Annamaria Boots please advise, thank you.

## 2012-11-25 NOTE — Telephone Encounter (Signed)
Called spoke with patient, discussed CY's recs with her as stated below.  Pt stated that she is on Spiriva QD and Atrovent HFA prn.  She stated that she was on albuterol when she first started CY "probably 15 yrs ago" and could not tolerate this d/t increased heart rate ((not documented in chart)).  Albuterol added to allergies.  DME order placed.  Dr Annamaria Boots please advise, thank you.

## 2012-12-03 DIAGNOSIS — H04129 Dry eye syndrome of unspecified lacrimal gland: Secondary | ICD-10-CM | POA: Diagnosis not present

## 2012-12-03 DIAGNOSIS — H16219 Exposure keratoconjunctivitis, unspecified eye: Secondary | ICD-10-CM | POA: Diagnosis not present

## 2012-12-03 DIAGNOSIS — H16049 Marginal corneal ulcer, unspecified eye: Secondary | ICD-10-CM | POA: Diagnosis not present

## 2012-12-04 ENCOUNTER — Telehealth: Payer: Self-pay | Admitting: *Deleted

## 2012-12-04 NOTE — Telephone Encounter (Signed)
Pt wanted you to know she has an ulcerated eye, she says the Dr. Geraldine Solar to plug her eyes? And she wants to know if this would be a problem with her diabetes? CB# [id number]

## 2012-12-05 ENCOUNTER — Telehealth: Payer: Self-pay | Admitting: *Deleted

## 2012-12-05 NOTE — Telephone Encounter (Signed)
Pt is aware.  

## 2012-12-05 NOTE — Telephone Encounter (Signed)
Should be no problem

## 2012-12-10 ENCOUNTER — Telehealth: Payer: Self-pay | Admitting: Internal Medicine

## 2012-12-10 MED ORDER — PREDNISONE 5 MG PO TABS: ORAL_TABLET | ORAL | Status: DC

## 2012-12-10 NOTE — Telephone Encounter (Addendum)
Per CDY: Ok to give Prednisone 5 mg #20 Take 4 tablets x 2 days, 3 tablets x 2 days, 2 tablets x 2 days, and 1 tablet x 2 days.  No refills.  ----  Called, spoke with pt.  Informed her of above per CDY.  She verbalized understanding and voiced no further questions or concerns at this time.  Pt aware rx sent to [facility] and it to call back if symptoms do not improve or worsen.

## 2012-12-10 NOTE — Telephone Encounter (Signed)
Patient reports worsening cough-- Took zpack last week Feels like she has not improved-- States cough is hard and feels like something needs to come out and when it does its mostly white-- had one "glob" of light green last week Having some chest tightness, baseline shortness of breath- Denies any f/c/s States she was given prednisone one time a long time ago and that really helped with her coughing  Pharmacy: [facility] [address]   Please advise Dr. Annamaria Boots, thanks!  Last OV 6/17 Next OV 9/16  Allergies  Allergen Reactions  . Clidinium-Chlordiazepoxide Other (See Comments)    Sleepy,weak  . Biaxin (Clarithromycin)     Foul taste, abd pain, diarrhea  . Codeine     REACTION: gi upset  . Tramadol

## 2012-12-10 NOTE — Telephone Encounter (Signed)
Per CDY:  Offer prednisone 10 mg take 40 mg x 2 days, 30 mg x 2 days, 20 mg x 2 days, 10 mg x 2 days #20  No refills.  ----  Called, spoke with pt.  Informed her of above per CDY. Pt states she gets "high as a [place]" off of the 10 mg prednisone tablets.  She is requesting rx for 5 mg tablets.  Dr. Annamaria Boots, pls advise.  Thank you.

## 2012-12-12 ENCOUNTER — Telehealth: Payer: Self-pay | Admitting: *Deleted

## 2012-12-12 NOTE — Telephone Encounter (Signed)
[name] called, she's been on prednisone for a few days due to a chronic cough, it's running her sugars high, she said this morning before breakfast it was 245, she says she's been taking a couple extra units of the long acting insulin, but she's nervous and would like for you to tell her how much extra she should be taking.   She will be finished with the prednisone in 2-3 days

## 2012-12-12 NOTE — Telephone Encounter (Signed)
Increase her Lantus in the evening by at least 4 units and also in the morning by 4 units if the supper readings are also over 200

## 2012-12-12 NOTE — Telephone Encounter (Signed)
Noted, pt is aware 

## 2012-12-25 ENCOUNTER — Other Ambulatory Visit: Payer: Self-pay | Admitting: *Deleted

## 2012-12-25 MED ORDER — INSULIN GLARGINE 100 UNIT/ML ~~LOC~~ SOLN: 29.0000 [IU] | Freq: Every day | SUBCUTANEOUS | Status: DC

## 2012-12-26 ENCOUNTER — Other Ambulatory Visit: Payer: Self-pay | Admitting: Pulmonary Disease

## 2012-12-26 ENCOUNTER — Telehealth: Payer: Self-pay | Admitting: Internal Medicine

## 2012-12-26 MED ORDER — PROMETHAZINE-CODEINE 6.25-10 MG/5ML PO SYRP: 5.0000 mL | ORAL_SOLUTION | Freq: Four times a day (QID) | ORAL | Status: DC | PRN

## 2012-12-26 NOTE — Telephone Encounter (Signed)
I have called RX into the pharmacy. Pt is aware and needed nothing further

## 2012-12-26 NOTE — Telephone Encounter (Signed)
OK x1  Pl refer to dr Annamaria Boots for further refills

## 2012-12-26 NOTE — Telephone Encounter (Signed)
Pt is needing refill promethazine w/ codeine. Last refilled 09/04/12 #200 ml x0 reiflls Last OV 10/22/12.  Since CDY is off will forward to Dr. Elsworth Soho to see if okay to refill. Thanks  Allergies  Allergen Reactions  . Clidinium-Chlordiazepoxide Other (See Comments)    Sleepy,weak  . Biaxin [Clarithromycin]     Foul taste, abd pain, diarrhea  . Codeine     REACTION: gi upset  . Tramadol

## 2012-12-30 ENCOUNTER — Other Ambulatory Visit: Payer: Self-pay | Admitting: *Deleted

## 2012-12-30 DIAGNOSIS — E119 Type 2 diabetes mellitus without complications: Secondary | ICD-10-CM

## 2013-01-01 ENCOUNTER — Encounter: Payer: Self-pay | Admitting: Endocrinology

## 2013-01-01 ENCOUNTER — Ambulatory Visit (INDEPENDENT_AMBULATORY_CARE_PROVIDER_SITE_OTHER): Payer: Medicare Other | Admitting: Endocrinology

## 2013-01-01 ENCOUNTER — Other Ambulatory Visit (INDEPENDENT_AMBULATORY_CARE_PROVIDER_SITE_OTHER): Payer: Medicare Other

## 2013-01-01 VITALS — BP 120/62 | HR 94 | Temp 98.2°F | Resp 12 | Ht 63.0 in | Wt 199.7 lb

## 2013-01-01 DIAGNOSIS — E119 Type 2 diabetes mellitus without complications: Secondary | ICD-10-CM

## 2013-01-01 DIAGNOSIS — E1149 Type 2 diabetes mellitus with other diabetic neurological complication: Secondary | ICD-10-CM

## 2013-01-01 DIAGNOSIS — IMO0001 Reserved for inherently not codable concepts without codable children: Secondary | ICD-10-CM | POA: Diagnosis not present

## 2013-01-01 DIAGNOSIS — E1140 Type 2 diabetes mellitus with diabetic neuropathy, unspecified: Secondary | ICD-10-CM

## 2013-01-01 DIAGNOSIS — B372 Candidiasis of skin and nail: Secondary | ICD-10-CM | POA: Diagnosis not present

## 2013-01-01 DIAGNOSIS — E1142 Type 2 diabetes mellitus with diabetic polyneuropathy: Secondary | ICD-10-CM

## 2013-01-01 DIAGNOSIS — E1165 Type 2 diabetes mellitus with hyperglycemia: Secondary | ICD-10-CM | POA: Insufficient documentation

## 2013-01-01 LAB — URINALYSIS, ROUTINE W REFLEX MICROSCOPIC
Bilirubin Urine: NEGATIVE
Hgb urine dipstick: NEGATIVE
Ketones, ur: NEGATIVE
Total Protein, Urine: NEGATIVE
Urine Glucose: 100

## 2013-01-01 LAB — MICROALBUMIN / CREATININE URINE RATIO: Microalb, Ur: 0.4 mg/dL (ref 0.0–1.9)

## 2013-01-01 MED ORDER — KETOCONAZOLE 2 % EX CREA: TOPICAL_CREAM | Freq: Every day | CUTANEOUS | Status: DC

## 2013-01-01 NOTE — Patient Instructions (Addendum)
HUMALOG  At supper 9-11 units at breakfast and supper even if not eating much; add 2-4 units more if sugar > 200.   Lantus at supper 33 until am sugar is <120  Take am Lantus on waking up  Use Nizoral cream daily for 2 weeks on rash

## 2013-01-01 NOTE — Progress Notes (Signed)
Patient ID: [name], female   DOB: [birth year], 77 y.o.   MRN: [id number]  [name] is an 77 y.o. female.   Reason for Appointment: Diabetes follow-up   History of Present Illness   Diagnosis: Type 2 DIABETES MELITUS, date of diagnosis:   1980   She has been on insulin using Lantus and Humalog for several years and usually A1c around 8% despite fairly good compliance with diet, exercise and glucose monitoring. She also had been on Actos previously which probably benefited her especially with her fatty liver but this was stopped because of potential weight gain. Her last A1c was 7.9 in April     Recent history:The patient's diabetes control appears to be poor again with average blood sugar at home 186 but has significant variability especially in the mornings  She has persistently high readings after supper with most readings over 175 an average at that time of 215 This is despite her eating relatively small portions and not excessive carbohydrates Also requiring relatively larger amounts of Lantus than she has had prednisone and various infections  Oral hypoglycemic drugs: None        Side effects from medications: None Insulin regimen: Lantus twice a day, 42--31 and Humalog 7-9 a.c. 3 times a day           Proper timing of medications in relation to meals: Yes.         Monitors blood glucose: Once a day.    Glucometer:  Accu-Chek         Blood Glucose readings from meter download: readings before breakfast:  97-215 with average about 160 nonfasting 95-312 with overall average 186 and averages are over 200 at bedtime. Has less variability at bedtime and most variability in the mornings and relatively higher readings at lunch also  Hypoglycemia frequency:  none recently, lowest glucose 95.          Meals: 3 meals per day.          Physical activity: exercise: Unable to do much           Dietician visit: Most recent: Several years ago           Complications: are: Peripheral  neuropathy    The last HbgA1c was reported as 7.9    Wt Readings from Last 3 Encounters:  01/01/13 199 lb 11.2 oz (90.583 kg)  10/22/12 196 lb 6.4 oz (89.086 kg)  08/09/12 198 lb (89.812 kg)   PROBLEM #2: She has had episodes of shakiness, previously associated with dizziness. However she is not as dizzy now but still gets occasional shakiness, sometimes this can start when she is getting anxious. Has been prescribed Xanax as needed for this. She has had evaluation in emergency room for such episodes and also has had negative studies including carotid Dopplers and brain scans; has been seen by ENT and neurologist She does not feel vertiginous when she turns her head now  PROBLEM #3: She has a rash with some itching in the areas under her breasts and also in the folds of skin on the lower abdomen. Using hydrocortisone or talcum powder only  PROBLEM #4: she has had recurrent infections including urinary and she'll and periodically gets prednisone for exacerbations of her COPD    Medication List       This list is accurate as of: 01/01/13 10:19 AM.  Always use your most recent med list.  acetaminophen 500 MG tablet  Commonly known as:  TYLENOL  Take 500 mg by mouth every 6 (six) hours as needed for pain.     ALPRAZolam 0.5 MG tablet  Commonly known as:  XANAX  Take 0.25 mg by mouth at bedtime as needed for anxiety.     aspirin 325 MG tablet  Take 325 mg by mouth daily.     B-D INS SYRINGE 0.5CC/30GX1/2" 30G X 1/2" 0.5 ML Misc  Generic drug:  Insulin Syringe-Needle U-100     candesartan 32 MG tablet  Commonly known as:  ATACAND  Take 16 mg daily     cholestyramine light 4 G packet  Commonly known as:  PREVALITE  Take 1 packet (4 g total) by mouth 2 (two) times daily.     diazepam 2 MG tablet  Commonly known as:  VALIUM  Take 1 tablet (2 mg total) by mouth every 6 (six) hours as needed for anxiety.     dicyclomine 10 MG capsule  Commonly known as:  BENTYL   Take 10 mg by mouth daily as needed.     diphenoxylate-atropine 2.5-0.025 MG per tablet  Commonly known as:  LOMOTIL  Take 1 tablet by mouth twice a day as needed     erythromycin ophthalmic ointment     IMODIUM PO  Take 1 tablet by mouth daily as needed (loose stool.).     insulin glargine 100 UNIT/ML injection  Commonly known as:  LANTUS  Inject 29-38 Units into the skin daily. Inject 42units every morning and 31 units at supper time.     ipratropium 17 MCG/ACT inhaler  Commonly known as:  ATROVENT HFA  Inhale 2 puffs into the lungs 2 (two) times daily as needed (shortness of breath).     levalbuterol 45 MCG/ACT inhaler  Commonly known as:  XOPENEX HFA  Inhale 2 puffs into the lungs every 6 (six) hours as needed for wheezing or shortness of breath.     magic mouthwash Soln  Take 10 mLs by mouth daily as needed (as needed to help with acid reflux per Dr. Delfin Edis).     meclizine 25 MG tablet  Commonly known as:  ANTIVERT  Take 25 mg by mouth 3 (three) times daily as needed for dizziness.     metoprolol succinate 25 MG 24 hr tablet  Commonly known as:  TOPROL-XL  Take 25 mg by mouth daily.     NOVOLOG PENFILL 100 UNIT/ML injection  Generic drug:  insulin aspart  Inject 0-12 Units into the skin 3 (three) times daily before meals. SSI     omeprazole 40 MG capsule  Commonly known as:  PRILOSEC  Take 40 mg by mouth daily.     pravastatin 40 MG tablet  Commonly known as:  PRAVACHOL     predniSONE 5 MG tablet  Commonly known as:  DELTASONE  Take 4 tablets x 2 days, 3 tablets x 2 days, 2 tablets x 2 days, and 1 tablet x 2 days     promethazine-codeine 6.25-10 MG/5ML syrup  Commonly known as:  PHENERGAN with CODEINE  Take 5 mLs by mouth every 6 (six) hours as needed for cough.     tiotropium 18 MCG inhalation capsule  Commonly known as:  SPIRIVA HANDIHALER  Place 1 capsule (18 mcg total) into inhaler and inhale daily.     tobramycin 0.3 % ophthalmic solution   Commonly known as:  TOBREX        Allergies:  Allergies  Allergen Reactions  . Clidinium-Chlordiazepoxide Other (See Comments)    Sleepy,weak  . Biaxin [Clarithromycin]     Foul taste, abd pain, diarrhea  . Codeine     REACTION: gi upset  . Tramadol     Past Medical History  Diagnosis Date  . Irritable bowel syndrome   . Other chronic nonalcoholic liver disease   . Esophageal reflux   . Allergic rhinitis, cause unspecified     Sinus CT Rec 12-23-2009  . 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 Sciatica   . Shingles 2010  . Steatohepatitis   . Diabetes mellitus   . Diverticulosis   . Hiatal hernia   . Esophageal stricture   . Diarrhea   . GERD (gastroesophageal reflux disease)   . Shortness of breath   . Hypertension   . Arthritis   . Glaucoma   . Complication of anesthesia     takes along time to wake up  . Oxygen deficiency   . Benign paroxysmal positional vertigo 08/09/2012  . Neuropathy in diabetes 08/09/2012    Past Surgical History  Procedure Laterality Date  . Cholecystectomy open  1978  . Liver biopsy  08-1992  . Parathyroid exploration    . Tonsillectomy and adenoidectomy    . Total abdominal hysterectomy    . Esophagogastroduodenoscopy  7124,58-09    H Hernia,es.stricture s/p dil 64F  . Colonoscopy  07-2001    mild diverticulosis  . Carpal tunnel release      right hand  . Cataract extraction      Bilateral  . Ulnar nerve transposition  12/07/2011    Procedure: ULNAR NERVE DECOMPRESSION/TRANSPOSITION;this was cancelled-not done  Surgeon: Cammie Sickle., MD;  Location: [facility];  Service: Orthopedics;  Laterality: Right;  right ulnar nerve in situ decompression  . Ulnar tunnel release  03/07/2012    Procedure: CUBITAL TUNNEL RELEASE;  Surgeon: Roseanne Kaufman, MD;  Location: [facility];  Service: Orthopedics;  Laterality: Right;  ulnar nerve release at the  elbow      Family History  Problem Relation Age of Onset  . Lung cancer Mother     small cell;Byssinosis  . Heart disease Father   . Lung cancer Sister   . Liver cancer Sister     ? mets from another area of the body  . Diabetes      grandmother  . Stroke Maternal Grandfather     Social History:  reports that she quit smoking about 24 years ago. Her smoking use included Cigarettes. She smoked 0.00 packs per day. She has never used smokeless tobacco. She reports that she does not drink alcohol or use illicit drugs.  Review of Systems:  HYPERTENSION:  blood pressure is excellent now. No lightheadedness  HYPERLIPIDEMIA: The lipid abnormality consists of elevated LDL treated with pravastatin which she is tolerating.  She is still complaining of frequent cough, is followed by pulmonologist for chronic COPD, taking home oxygen  She has periodic diarrhea which has been previously relieved by Questran, probably irritable bowel syndrome but not clear if she has neuropathy  She has significant numbness in her feet and legs and has had some discomfort but usually not desiring medications because of fear of side effects     Examination:   BP 120/62  Pulse 94  Temp(Src) 98.2 F (36.8 C)  Resp 12  Ht 5' 3"  (1.6 m)  Wt 199 lb  11.2 oz (90.583 kg)  BMI 35.38 kg/m2  SpO2 95%  Body mass index is 35.38 kg/(m^2).   Erythematous patches in the folds of the skin below the breast with irregular margins, no exudate or excoriation  No pedal edema  ASSESSMENT/ PLAN::   Diabetes type 2   The patient's diabetes control appears to be poor again with average blood sugar at home 186 but has significant variability especially in the mornings  She has persistently high readings after supper with most readings over 175 an average at that time of 215 This is despite her eating relatively small portions and not excessive carbohydrates Also requiring relatively larger amounts of Lantus than she has  had prednisone and various infections  Discussed needing to at least 2 more units of Humalog at breakfast and supper to help with higher readings at lunch and bedtime She will also take her morning Lantus on waking up since she appears to have a Dawn phenomenon Discussed adjusting Lantus in the evening based on fasting blood sugar trend, she can call if needed as she is usually uncomfortable adjusting on her own She will continue to monitor her blood sugars and adjust Humalog doses for high readings also Encouraged her to increase activity as tolerated  Intertrigo: She will use Nizoral cream  HYPERTENSION: Well-controlled, no change in medication needed  Episodes of shakiness: Likely to be panic attacks  Draydon Clairmont 01/01/2013, 10:19 AM

## 2013-01-02 ENCOUNTER — Telehealth: Payer: Self-pay | Admitting: Endocrinology

## 2013-01-02 ENCOUNTER — Other Ambulatory Visit: Payer: Self-pay | Admitting: *Deleted

## 2013-01-02 LAB — COMPREHENSIVE METABOLIC PANEL
Albumin: 4 g/dL (ref 3.5–5.2)
CO2: 33 mEq/L — ABNORMAL HIGH (ref 19–32)
Calcium: 9.8 mg/dL (ref 8.4–10.5)
Chloride: 95 mEq/L — ABNORMAL LOW (ref 96–112)
GFR: 99.59 mL/min (ref >60.00)
Glucose, Bld: 193 mg/dL — ABNORMAL HIGH (ref 70–99)
Potassium: 4.5 mEq/L (ref 3.5–5.1)
Sodium: 135 mEq/L (ref 135–145)
Total Bilirubin: 0.5 mg/dL (ref 0.3–1.2)
Total Protein: 7.6 g/dL (ref 6.0–8.3)

## 2013-01-02 MED ORDER — DIPHENOXYLATE-ATROPINE 2.5-0.025 MG PO TABS: ORAL_TABLET | ORAL | Status: DC

## 2013-01-02 MED ORDER — METRONIDAZOLE 250 MG PO TABS: 250.0000 mg | ORAL_TABLET | Freq: Three times a day (TID) | ORAL | Status: DC

## 2013-01-02 NOTE — Telephone Encounter (Signed)
Discussed with patient, she is not improving with Questran, may have antibiotic-induced diarrhea  Needs prescription for Flagyl 250 mg 3 times a day for 7 days and Lomotil 1 tablet every 6 hours as needed for diarrhea, 15 tablets

## 2013-01-02 NOTE — Telephone Encounter (Signed)
Patient says her prescriptions have not been called in as she was instructed. Please call / [name] S.

## 2013-01-02 NOTE — Telephone Encounter (Signed)
Pt wants clarification on her lantus she says usually at supper she takes 31 units, she said her sugar was 109 this morning, she wants to know how long you want her to take the 33 units? She still has bad diarrhea and says her stool is floating on the top of the toilet, she thinks this means something terrible and wanted to talk to you about that.  She is requesting Lomotil since paying for the over the counter meds is very expensive for her.

## 2013-01-02 NOTE — Telephone Encounter (Signed)
Prescriptions have been sent to pt's pharmacy

## 2013-01-07 ENCOUNTER — Telehealth: Payer: Self-pay | Admitting: Internal Medicine

## 2013-01-07 NOTE — Telephone Encounter (Signed)
Pt advised. Jennifer Castillo, CMA  

## 2013-01-07 NOTE — Telephone Encounter (Signed)
Per CY-Delsym OTC.

## 2013-01-07 NOTE — Telephone Encounter (Signed)
I spoke with the pt and and states her dry cough is worse. She states she does not want any antibiotics because she has had so many she had a reaction that Dr.Kumar is treating her for this. She states she is drinking water all day, using cough drops, and taking her cough syrup. The pt again does not want any abx or prednisone. She has an appt on 01/21/13 with CY. Pt wanted to know if there was anything else she could be doing to help the cough. Please advise. [place]field Bing, CMA  Allergies  Allergen Reactions  . Clidinium-Chlordiazepoxide Other (See Comments)    Sleepy,weak  . Biaxin [Clarithromycin]     Foul taste, abd pain, diarrhea  . Codeine     REACTION: gi upset  . Tramadol

## 2013-01-10 ENCOUNTER — Telehealth: Payer: Self-pay | Admitting: Endocrinology

## 2013-01-10 NOTE — Telephone Encounter (Signed)
No urine infection, blood tests are fairly good overall

## 2013-01-10 NOTE — Telephone Encounter (Signed)
Noted pt is aware, she's saying her sugars are still over 200.

## 2013-01-10 NOTE — Telephone Encounter (Signed)
Pt wants lab and urine results, please advise

## 2013-01-10 NOTE — Telephone Encounter (Signed)
Call w/ lab and urine results. Pt says no one has ever called / [name] S.

## 2013-01-11 NOTE — Telephone Encounter (Signed)
As before need to use 2 units more of all insulins when sugars > 200

## 2013-01-21 ENCOUNTER — Encounter: Payer: Self-pay | Admitting: Internal Medicine

## 2013-01-21 ENCOUNTER — Telehealth: Payer: Self-pay | Admitting: Internal Medicine

## 2013-01-21 ENCOUNTER — Ambulatory Visit (INDEPENDENT_AMBULATORY_CARE_PROVIDER_SITE_OTHER)
Admission: RE | Admit: 2013-01-21 | Discharge: 2013-01-21 | Disposition: A | Discharge status: Home / Self Care | Payer: Medicare Other | Source: Ambulatory Visit | Attending: Internal Medicine | Admitting: Internal Medicine

## 2013-01-21 ENCOUNTER — Ambulatory Visit (INDEPENDENT_AMBULATORY_CARE_PROVIDER_SITE_OTHER): Payer: Medicare Other | Admitting: Internal Medicine

## 2013-01-21 VITALS — BP 124/60 | HR 88 | Ht 63.0 in | Wt 201.0 lb

## 2013-01-21 DIAGNOSIS — R079 Chest pain, unspecified: Secondary | ICD-10-CM | POA: Diagnosis not present

## 2013-01-21 DIAGNOSIS — J449 Chronic obstructive pulmonary disease, unspecified: Secondary | ICD-10-CM

## 2013-01-21 DIAGNOSIS — R059 Cough, unspecified: Secondary | ICD-10-CM | POA: Diagnosis not present

## 2013-01-21 DIAGNOSIS — Z23 Encounter for immunization: Secondary | ICD-10-CM | POA: Diagnosis not present

## 2013-01-21 DIAGNOSIS — R05 Cough: Secondary | ICD-10-CM | POA: Diagnosis not present

## 2013-01-21 MED ORDER — IPRATROPIUM BROMIDE HFA 17 MCG/ACT IN AERS: 2.0000 | INHALATION_SPRAY | Freq: Two times a day (BID) | RESPIRATORY_TRACT | Status: DC | PRN

## 2013-01-21 NOTE — Progress Notes (Signed)
Patient ID: [name], female    DOB: [birth year], 77 y.o.   MRN: [id number]  HPI SATURATION QUALIFICATIONS: Patient Saturations on Room Air while Ambulating = 84% Patient Saturations on 2.5 Liters of oxygen while Ambulating = 92%[name],CMA  09/28/10-  75 yoF former smoker with COPD, complicated by chronic rhinitis, GERD. Last here 04/29/10- note reviewed. Acute visit.-- Woke with soreness across under her breasts. Losing voice. Coughing spells, with much liquid coming up and from nose. Prior to that hadn't been coughing. C/o weak, dizzy, light headed- relieved by her oxygen. . Moving into a smaller apartment. Denies fever. Easy DOE short walks room to room.   10/31/10- 14 yoF former smoker with COPD, complicated by chronic rhinitis, GERD. She denies major change since last here. Has to stay in to avoid heat and on her oxygen much of the time in the house. Coughs scant clear or lemon yellow, with some hoarseness which comes and goes. She describes hoarseness as worse with phone conversations, variable but rarely if ever clear. She had seen Dr [name] years ago for epistaxis. Easy choke with eating. Discussed aspiration precautions, chin tuck.   02/26/11- 48 yoF former smoker with COPD, complicated by chronic rhinitis, GERD. Has had flu vaccine. No coughing through the summer. We first cool days as she stepped outside she would begin to cough again. Dry hacking cough. Very aware of active reflux anytime she bends over, regurgitating recently swallowed food. Has also had some diarrhea. Complains of spasms intermittently left lower anterior chest and left upper quadrant. It may all be GI. She thinks it is part of her diabetic neuropathy. Has appointment soon with Dr.Brodie.  08/16/11-75 yoF former smoker with COPD, complicated by chronic rhinitis, GERD. During the winter had prolonged diarrhea evaluated by Dr [name] and apparently attributed to her diabetic neuropathy. May have had  esophagitis-was told to take Magic mouthwash. Gradually easier dyspnea on exertion. Now on oxygen 2-3 L most of the time. She came today with acute complaint of aching pain intermittently for the past several weeks, dull. Associated with mid back at strap level. Pain is gone right now. She saw no effect of activity or position and said pain was nonpleuritic or radiating.  CXR- 08/16/11- reviewed w/ her. NAD with no acute process.   08/28/11-75 yoF former smoker with COPD, complicated by chronic rhinitis, GERD,  DM. Back pain comes and goes. She implies that it is better today and says that she really has been present a long time as part of her degenerative back pain. Complains of feeling sluggish and tired. Recent bronchitis with productive green sputum watery nose no fever. Using Atrovent. Intolerant of albuterol and Symbicort because of overstimulation. Home oxygen is 2-1/2-3 L. CXR 08/15/11-  IMPRESSION:  Hyperinflation.  No active cardiopulmonary disease.  Original Report Authenticated By: [name], M.D.   12/28/11- 18 yoF former smoker with COPD, complicated by chronic rhinitis, GERD,  DM. Not doing well-stays on O2 all the time; since being on Spiriva at first did great but has noticed having to stop more and rest during the day(nap) She  stays indoors most of the time without many friends to do things with. She often has self-limited sharp twinges right anterior chest wall and left infrascapular back. With her known degenerative disc disease, we discussed the possibility this was nerve root irritation. Spiriva helps variable cough.  04/23/12- 58 yoF former smoker with COPD, complicated by chronic rhinitis, GERD,  DM. FOLLOWS FOR: still  has good and bad days with SOB and activity levels Watery rhinorrhea. No acute problems. No respiratory problems associated with surgery for nerve impingement in her right hand. He continues oxygen 3 or 3 a half liters/Advanced PFT: 01/16/2012 severe  obstructive airways disease with insignificant response to bronchodilator, air trapping, diffusion moderately reduced. FEV1 0.82/45%, FEV1/FEC 0.46. Emphysema pattern on the loop. TLC 100%, RV 148%, DLCO 47%.  10/22/12- 60 yoF former smoker with COPD, complicated by chronic rhinitis, GERD,  DM FOLLOWS FOR:has to pace herself more; continues to have nervous spell(could be from DM) SOB pretty much all the time Episodic shaky spells for which she says she's had evaluation by neurology, ENT, primary physician and ER. "Electric shock sensation" from her known neuropathy. Stress makes all of this worse. Breathing has not really changed. We talked about the possibility she was having a vasomotor/autonomic neuropathy related to her diabetes  01/21/13- 77 yoF former smoker with COPD, complicated by chronic rhinitis, GERD,  DM FOLLOWS FOR:has to pace herself more; continues to have nervous spell(could be from DM) SOB pretty much all the time Has been treated for a corneal abscess with Cipro, another antibiotic for cough, and for chronic cystitis. Got blistering intertrigo which Dr. Dwyane Dee treated with Flagyl by her report. Little change in her chronic cough with clear mucus. Twinges of bilateral tussive rib pains.  Review of Systems-see HPI Constitutional:   No-   weight loss, night sweats, fevers, chills,  +fatigue, lassitude. HEENT:   No-  headaches, difficulty swallowing, tooth/dental problems, sore throat,       No-  sneezing, itching, ear ache, nasal congestion, post nasal drip,  CV:  + Atypical chest pains, no-orthopnea, PND, swelling in lower extremities, anasarca, dizziness, palpitations Resp: + shortness of breath with exertion or at rest.              +  productive cough,  + non-productive cough,  No- coughing up of blood.              No-   change in color of mucus.  No- wheezing.   Skin: No-   rash or lesions. GI:  No-   heartburn, indigestion,   +abdominal pain, nausea, vomiting,   +diarrhea,   GU:  MS:  No-   joint pain or swelling.    + back pain. Neuro-     nothing unusual Psych:  No- change in mood or affect. + depression or anxiety.  No memory loss.    Objective:   Physical Exam Again very similar to recent examinations: General- Alert, Oriented, Affect-animated, cheerful, Distress- none acute. Talkative.  on O2 3.5 L Skin- rash-none, lesions- none, excoriation- none Lymphadenopathy- none Head- atraumatic            Eyes- Gross vision intact, PERRLA, conjunctivae clear secretions            Ears- Hearing, canals-normal            Nose- Clear, no-Septal dev, mucus, polyps, erosion, perforation             Throat- Mallampati II , mucosa clear , drainage- none, tonsils- atrophic Neck- flexible , trachea midline, no stridor , thyroid nl, carotid no bruit Chest - symmetrical excursion , unlabored           Heart/CV- RRR , no murmur , no gallop  , no rub, nl s1 s2                           -  JVD- none , edema- none, stasis changes- none, varices- none           Lung- +diminished, clear, wheeze- none, cough- none while here , dullness-none, rub- none           Chest wall- back is not tender Abd-  Br/ Gen/ Rectal- Not done, not indicated Extrem- cyanosis- none, clubbing, none, atrophy- none, strength- nl Neuro- grossly intact to observation

## 2013-01-21 NOTE — Patient Instructions (Addendum)
Flu vax  Order CXR  Dx COPD

## 2013-01-21 NOTE — Telephone Encounter (Signed)
Advised pt that refill was sent pharmacy for Atrovent inhaler.  Also advised pt of cxr results per Dr Annamaria Boots

## 2013-01-21 NOTE — Telephone Encounter (Signed)
lmomtcb x1 for pt 

## 2013-01-21 NOTE — Telephone Encounter (Signed)
Pharmacy wanted to make sure we were aware that patient is also on Spiriva-we are aware and know that patient uses Atrovent sparingly.

## 2013-01-22 ENCOUNTER — Telehealth: Payer: Self-pay | Admitting: Endocrinology

## 2013-01-22 MED ORDER — DICYCLOMINE HCL 10 MG PO CAPS: 10.0000 mg | ORAL_CAPSULE | Freq: Every day | ORAL | Status: DC | PRN

## 2013-01-22 NOTE — Telephone Encounter (Signed)
rx sent

## 2013-01-30 ENCOUNTER — Encounter: Payer: Self-pay | Admitting: Internal Medicine

## 2013-01-30 NOTE — Assessment & Plan Note (Signed)
She cannot give much detail, but has been treated with several antibiotics this summer, including Cipro, without much effect on her chronic cough. She has other GI complaints, but is not aware of reflux. Plan- CXR, Flu vax

## 2013-02-05 ENCOUNTER — Other Ambulatory Visit: Payer: Self-pay | Admitting: *Deleted

## 2013-02-05 MED ORDER — OMEPRAZOLE 40 MG PO CPDR: 40.0000 mg | DELAYED_RELEASE_CAPSULE | Freq: Every day | ORAL | Status: DC

## 2013-02-06 ENCOUNTER — Other Ambulatory Visit: Payer: Self-pay | Admitting: *Deleted

## 2013-02-06 MED ORDER — INSULIN PEN NEEDLE 31G X 8 MM MISC: 1.0000 | Freq: Three times a day (TID) | Status: DC

## 2013-02-07 ENCOUNTER — Telehealth: Payer: Self-pay | Admitting: Endocrinology

## 2013-02-07 NOTE — Telephone Encounter (Signed)
rx sent, pt is aware

## 2013-02-12 ENCOUNTER — Ambulatory Visit (INDEPENDENT_AMBULATORY_CARE_PROVIDER_SITE_OTHER): Payer: Medicare Other | Admitting: Endocrinology

## 2013-02-12 ENCOUNTER — Encounter: Payer: Self-pay | Admitting: Endocrinology

## 2013-02-12 VITALS — BP 124/60 | HR 90 | Temp 98.2°F | Resp 12 | Ht 63.0 in | Wt 199.5 lb

## 2013-02-12 DIAGNOSIS — E785 Hyperlipidemia, unspecified: Secondary | ICD-10-CM | POA: Diagnosis not present

## 2013-02-12 DIAGNOSIS — IMO0001 Reserved for inherently not codable concepts without codable children: Secondary | ICD-10-CM | POA: Diagnosis not present

## 2013-02-12 DIAGNOSIS — K589 Irritable bowel syndrome without diarrhea: Secondary | ICD-10-CM | POA: Diagnosis not present

## 2013-02-12 LAB — URINALYSIS, ROUTINE W REFLEX MICROSCOPIC
Ketones, ur: NEGATIVE
Nitrite: NEGATIVE
RBC / HPF: NONE SEEN (ref 0–?)
Specific Gravity, Urine: 1.015 (ref 1.000–1.030)
Total Protein, Urine: NEGATIVE
Urine Glucose: NEGATIVE
Urobilinogen, UA: 0.2 (ref 0.0–1.0)
pH: 6 (ref 5.0–8.0)

## 2013-02-12 LAB — BASIC METABOLIC PANEL
CO2: 28 mEq/L (ref 19–32)
Calcium: 9.5 mg/dL (ref 8.4–10.5)
GFR: 95.98 mL/min (ref >60.00)
Glucose, Bld: 192 mg/dL — ABNORMAL HIGH (ref 70–99)
Potassium: 4.4 mEq/L (ref 3.5–5.1)
Sodium: 139 mEq/L (ref 135–145)

## 2013-02-12 LAB — LIPID PANEL
HDL: 42.9 mg/dL (ref >39.00)
VLDL: 95 mg/dL — ABNORMAL HIGH (ref 0.0–40.0)

## 2013-02-12 LAB — MICROALBUMIN / CREATININE URINE RATIO: Creatinine,U: 64.9 mg/dL

## 2013-02-12 LAB — HEMOGLOBIN A1C: Hgb A1c MFr Bld: 8.8 % — ABNORMAL HIGH (ref 4.6–6.5)

## 2013-02-12 MED ORDER — LORATADINE 10 MG PO TABS: 10.0000 mg | ORAL_TABLET | Freq: Every day | ORAL | Status: DC

## 2013-02-12 NOTE — Progress Notes (Signed)
Patient ID: [name], female   DOB: 29-Jul-1935, 77 y.o.   MRN: [id number]  [name] is an 77 y.o. female.   Reason for Appointment: Diabetes follow-up   History of Present Illness   Diagnosis: Type 2 DIABETES MELITUS, date of diagnosis:   1980   She has been on insulin using Lantus and Humalog for several years and usually A1c around 8% despite fairly good compliance with diet, exercise and glucose monitoring. She also had been on Actos previously which probably benefited her especially with her fatty liver but this was stopped because of potential weight gain. Her last A1c was 7.9 in April     Recent history: The patient's diabetes control appears to be only fair with average blood sugar at home 181 which is similar to the last time but has significant variability at all different times. Current patterns: Her fasting blood sugars are less variable although were somewhat higher about 10 days ago. Most of her readings have been high later in the day although not consistently and only high in the evenings this week. Overall highest average blood sugars midmorning and around supper time, average reading at most times of the day between 180-200  Still tends to have higher blood sugars after breakfast and supper This is despite her eating relatively small portions and not excessive carbohydrates She had increased her Lantus when her blood sugars were running high and has not reduced it as yet. However does not appear to be increasing her Humalog enough to keep her from having postprandial hyperglycemia  Oral hypoglycemic drugs: None        Side effects from medications: None Insulin regimen: Lantus twice a day, 42--31 and Humalog 7-9 a.c. 3 times a day           Proper timing of medications in relation to meals: Yes.         Monitors blood glucose: Once a day.    Glucometer:  Accu-Chek         Blood Glucose readings from meter download: readings before breakfast: 112-187 and  nonfasting 113-267, overnight readings 140-243 Blood sugars are the lowest early morning and the highest midmorning and evenings   Has less variability at bedtime and in the mornings  Hypoglycemia frequency:  none recently, lowest glucose 112.          Meals: 3 meals per day. Supper 6-7         Physical activity: exercise: Unable to do much because of dyspnea           Dietician visit: Most recent: Several years ago           Complications: are: Peripheral neuropathy    The previous HbgA1c was reported as 7.9    Lab Results  Component Value Date   HGBA1C 8.8* 02/12/2013     Wt Readings from Last 3 Encounters:  02/12/13 199 lb 8 oz (90.493 kg)  01/21/13 201 lb (91.173 kg)  01/01/13 199 lb 11.2 oz (90.583 kg)   PROBLEM #2: less She has had episodes of shakiness, previously associated with dizziness. However she is not as dizzy now but still gets occasional shakiness, sometimes this can start when she is getting anxious. Has been prescribed Xanax as needed for this. She has had evaluation in emergency room for such episodes and also has had negative studies including carotid Dopplers and brain scans; has been seen by ENT and neurologist She does not feel vertiginous when she turns her  head now  PROBLEM #3: She has a rash with some itching in the areas under her breasts and also in the folds of skin on the lower abdomen. Using hydrocortisone or talcum powder only  PROBLEM #4: she has had recurrent infections including urinary and she'll and periodically gets prednisone for exacerbations of her COPD    Medication List       This list is accurate as of: 02/12/13 11:59 PM.  Always use your most recent med list.               acetaminophen 500 MG tablet  Commonly known as:  TYLENOL  Take 500 mg by mouth every 6 (six) hours as needed for pain.     ALPRAZolam 0.5 MG tablet  Commonly known as:  XANAX  Take 0.25 mg by mouth at bedtime as needed for anxiety.     aspirin 325 MG tablet   Take 325 mg by mouth daily.     B-D INS SYRINGE 0.5CC/30GX1/2" 30G X 1/2" 0.5 ML Misc  Generic drug:  Insulin Syringe-Needle U-100     candesartan 32 MG tablet  Commonly known as:  ATACAND  Take 16 mg daily     cholestyramine 4 G packet  Commonly known as:  QUESTRAN  Take 1 packet by mouth. Take 1 packet as needed by mouth.     diazepam 2 MG tablet  Commonly known as:  VALIUM  Take 1 tablet (2 mg total) by mouth every 6 (six) hours as needed for anxiety.     dicyclomine 10 MG capsule  Commonly known as:  BENTYL  Take 1 capsule (10 mg total) by mouth daily as needed.     diphenoxylate-atropine 2.5-0.025 MG per tablet  Commonly known as:  LOMOTIL  Take 1 tablet by mouth by mouth every 6 hours     erythromycin ophthalmic ointment     IMODIUM PO  Take 1 tablet by mouth daily as needed (loose stool.).     insulin glargine 100 UNIT/ML injection  Commonly known as:  LANTUS  Inject 29-38 Units into the skin daily. Inject 42units every morning and 31 units at supper time.     Insulin Pen Needle 31G X 8 MM Misc  Commonly known as:  B-D ULTRAFINE III SHORT PEN  Inject 1 each into the skin 3 (three) times daily.     ipratropium 17 MCG/ACT inhaler  Commonly known as:  ATROVENT HFA  Inhale 2 puffs into the lungs 2 (two) times daily as needed (shortness of breath).     ketoconazole 2 % cream  Commonly known as:  NIZORAL  Apply topically daily.     levalbuterol 45 MCG/ACT inhaler  Commonly known as:  XOPENEX HFA  Inhale 2 puffs into the lungs every 6 (six) hours as needed for wheezing or shortness of breath.     loratadine 10 MG tablet  Commonly known as:  CLARITIN  Take 1 tablet (10 mg total) by mouth daily.     magic mouthwash Soln  Take 10 mLs by mouth daily as needed (as needed to help with acid reflux per Dr. Delfin Edis).     meclizine 25 MG tablet  Commonly known as:  ANTIVERT  Take 25 mg by mouth 3 (three) times daily as needed for dizziness.     metoprolol  succinate 25 MG 24 hr tablet  Commonly known as:  TOPROL-XL  Take 25 mg by mouth daily.     NOVOLOG PENFILL 100 UNIT/ML injection  Generic drug:  insulin aspart  Inject 0-12 Units into the skin 3 (three) times daily before meals. SSI     omeprazole 40 MG capsule  Commonly known as:  PRILOSEC  Take 20 mg by mouth daily. Take one capsule 2 times a day     pravastatin 40 MG tablet  Commonly known as:  PRAVACHOL     promethazine-codeine 6.25-10 MG/5ML syrup  Commonly known as:  PHENERGAN with CODEINE  Take 5 mLs by mouth every 6 (six) hours as needed for cough.     tiotropium 18 MCG inhalation capsule  Commonly known as:  SPIRIVA HANDIHALER  Place 1 capsule (18 mcg total) into inhaler and inhale daily.     VIGAMOX 0.5 % ophthalmic solution  Generic drug:  moxifloxacin        Allergies:  Allergies  Allergen Reactions  . Clidinium-Chlordiazepoxide Other (See Comments)    Sleepy,weak  . Biaxin [Clarithromycin]     Foul taste, abd pain, diarrhea  . Codeine     REACTION: gi upset  . Tramadol     Past Medical History  Diagnosis Date  . Irritable bowel syndrome   . Other chronic nonalcoholic liver disease   . Esophageal reflux   . Allergic rhinitis, cause unspecified     Sinus CT Rec 12-23-2009  . 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 Sciatica   . Shingles 2010  . Steatohepatitis   . Diabetes mellitus   . Diverticulosis   . Hiatal hernia   . Esophageal stricture   . Diarrhea   . GERD (gastroesophageal reflux disease)   . Shortness of breath   . Hypertension   . Arthritis   . Glaucoma   . Complication of anesthesia     takes along time to wake up  . Oxygen deficiency   . Benign paroxysmal positional vertigo 08/09/2012  . Neuropathy in diabetes 08/09/2012    Past Surgical History  Procedure Laterality Date  . Cholecystectomy open  1978  . Liver biopsy  08-1992  . Parathyroid exploration    .  Tonsillectomy and adenoidectomy    . Total abdominal hysterectomy    . Esophagogastroduodenoscopy  4259,56-38    H Hernia,es.stricture s/p dil 25F  . Colonoscopy  07-2001    mild diverticulosis  . Carpal tunnel release      right hand  . Cataract extraction      Bilateral  . Ulnar nerve transposition  12/07/2011    Procedure: ULNAR NERVE DECOMPRESSION/TRANSPOSITION;this was cancelled-not done  Surgeon: Cammie Sickle., MD;  Location: [facility];  Service: Orthopedics;  Laterality: Right;  right ulnar nerve in situ decompression  . Ulnar tunnel release  03/07/2012    Procedure: CUBITAL TUNNEL RELEASE;  Surgeon: Roseanne Kaufman, MD;  Location: [facility];  Service: Orthopedics;  Laterality: Right;  ulnar nerve release at the elbow      Family History  Problem Relation Age of Onset  . Lung cancer Mother     small cell;Byssinosis  . Heart disease Father   . Lung cancer Sister   . Liver cancer Sister     ? mets from another area of the body  . Diabetes      grandmother  . Stroke Maternal Grandfather     Social History:  reports that she quit smoking about 24 years ago. Her smoking use included Cigarettes. She smoked 0.00 packs per day. She has  never used smokeless tobacco. She reports that she does not drink alcohol or use illicit drugs.  Review of Systems:  HYPERTENSION:  blood pressure is excellent now. No lightheadedness on standing up  HYPERLIPIDEMIA: The lipid abnormality consists of elevated LDL treated with pravastatin which she is tolerating.  She is still complaining of frequent cough and dyspnea, is followed by pulmonologist for chronic COPD, taking home oxygen  She has periodic diarrhea which has been better controlled with Questran, probably irritable bowel syndrome but not clear if she has neuropathy. Discussed needing followup with GI to help with medication adjustment  She has significant numbness in her feet and legs and has had some  discomfort but usually not desiring medications because of fear of side effects     Examination:   BP 124/60  Pulse 90  Temp(Src) 98.2 F (36.8 C)  Resp 12  Ht 5' 3"  (1.6 m)  Wt 199 lb 8 oz (90.493 kg)  BMI 35.35 kg/m2  SpO2 96%  Body mass index is 35.35 kg/(m^2).   No pedal edema  ASSESSMENT/ PLAN::   Diabetes type 2   The patient's diabetes control appears to be inadequate again with average blood sugar at home 181 but has significant variability especially in the evenings  She has mostly high readings after supper, had been compliant with taking her mealtime insulin before eating She has high readings during the day despite her eating relatively small portions and not excessive carbohydrates Also requiring relatively larger amounts of Lantus in the last few months  Discussed needing to at least 2 more units of Humalog at breakfast and supper to help with higher readings after breakfast and bedtime even if she is not eating a sizable meal She will continue to monitor her blood sugars and adjust Humalog doses for high readings also She is not able to exercise because of fatigue and dyspnea but her weight is stable  HYPERTENSION: Well-controlled, no change in medication needed  Episodes of shakiness: Likely to be panic attacks, less frequent now  HYPERCHOLESTEROLEMIA: Will check her labs again  [facility] 02/14/2013, 1:54 PM   Office Visit on 02/12/2013  Component Date Value Range Status  . Sodium 02/12/2013 139  135 - 145 mEq/L Final  . Potassium 02/12/2013 4.4  3.5 - 5.1 mEq/L Final  . Chloride 02/12/2013 98  96 - 112 mEq/L Final  . CO2 02/12/2013 28  19 - 32 mEq/L Final  . Glucose, Bld 02/12/2013 192* 70 - 99 mg/dL Final  . BUN 02/12/2013 12  6 - 23 mg/dL Final  . Creatinine, Ser 02/12/2013 0.6  0.4 - 1.2 mg/dL Final  . Calcium 02/12/2013 9.5  8.4 - 10.5 mg/dL Final  . GFR 02/12/2013 95.98  >60.00 mL/min Final  . Cholesterol 02/12/2013 230* 0 - 200 mg/dL Final    ATP III Classification       Desirable:  < 200 mg/dL               Borderline High:  200 - 239 mg/dL          High:  > = 240 mg/dL  . Triglycerides 02/12/2013 475.0 Triglyceride is over 400; calculations on Lipids are invalid.* 0.0 - 149.0 mg/dL Final   Normal:  <150 mg/dLBorderline High:  150 - 199 mg/dL  . HDL 02/12/2013 42.90  >39.00 mg/dL Final  . VLDL 02/12/2013 95.0* 0.0 - 40.0 mg/dL Final  . Total CHOL/HDL Ratio 02/12/2013 5   Final  Men          Women1/2 Average Risk     3.4          3.3Average Risk          5.0          4.42X Average Risk          9.6          7.13X Average Risk          15.0          11.0                      . Microalb, Ur 02/12/2013 0.8  0.0 - 1.9 mg/dL Final  . Creatinine,U 02/12/2013 64.9   Final  . Microalb Creat Ratio 02/12/2013 1.2  0.0 - 30.0 mg/g Final  . Color, Urine 02/12/2013 LT. YELLOW  Yellow;Lt. Yellow Final  . APPearance 02/12/2013 CLEAR  Clear Final  . Specific Gravity, Urine 02/12/2013 1.015  1.000-1.030 Final  . pH 02/12/2013 6.0  5.0 - 8.0 Final  . Total Protein, Urine 02/12/2013 NEGATIVE  Negative Final  . Urine Glucose 02/12/2013 NEGATIVE  Negative Final  . Ketones, ur 02/12/2013 NEGATIVE  Negative Final  . Bilirubin Urine 02/12/2013 NEGATIVE  Negative Final  . Hgb urine dipstick 02/12/2013 NEGATIVE  Negative Final  . Urobilinogen, UA 02/12/2013 0.2  0.0 - 1.0 Final  . Leukocytes, UA 02/12/2013 TRACE  Negative Final  . Nitrite 02/12/2013 NEGATIVE  Negative Final  . WBC, UA 02/12/2013 0-2/hpf  0-2/hpf Final  . RBC / HPF 02/12/2013 none seen  0-2/hpf Final  . Squamous Epithelial / LPF 02/12/2013 Rare(0-4/hpf)  Rare(0-4/hpf) Final  . Hemoglobin A1C 02/12/2013 8.8* 4.6 - 6.5 % Final   Glycemic Control Guidelines for People with Diabetes:Non Diabetic:  <6%Goal of Therapy: <7%Additional Action Suggested:  >8%   . Direct LDL 02/12/2013 149.0   Final   Optimal:  <100 mg/dLNear or Above Optimal:  100-129 mg/dLBorderline High:   130-159 mg/dLHigh:  160-189 mg/dLVery High:  >190 mg/dL

## 2013-02-12 NOTE — Patient Instructions (Addendum)
Humalog extra 2 units at supper

## 2013-02-13 LAB — LDL CHOLESTEROL, DIRECT: Direct LDL: 149 mg/dL

## 2013-02-15 DIAGNOSIS — E785 Hyperlipidemia, unspecified: Secondary | ICD-10-CM | POA: Insufficient documentation

## 2013-02-18 ENCOUNTER — Other Ambulatory Visit: Payer: Self-pay | Admitting: *Deleted

## 2013-02-18 MED ORDER — FENOFIBRATE MICRONIZED 134 MG PO CAPS: 134.0000 mg | ORAL_CAPSULE | Freq: Every day | ORAL | Status: DC

## 2013-02-20 ENCOUNTER — Telehealth: Payer: Self-pay | Admitting: *Deleted

## 2013-02-20 MED ORDER — CANDESARTAN CILEXETIL 32 MG PO TABS: 16.0000 mg | ORAL_TABLET | Freq: Every day | ORAL | Status: DC

## 2013-02-20 NOTE — Telephone Encounter (Signed)
rx sent

## 2013-02-24 ENCOUNTER — Telehealth: Payer: Self-pay | Admitting: *Deleted

## 2013-02-24 ENCOUNTER — Telehealth: Payer: Self-pay | Admitting: Internal Medicine

## 2013-02-24 MED ORDER — PREDNISONE 20 MG PO TABS: 20.0000 mg | ORAL_TABLET | Freq: Every day | ORAL | Status: DC

## 2013-02-24 NOTE — Telephone Encounter (Signed)
We can give prednisone 20 mg daily x 4 days. It will run her sugar up temporarily

## 2013-02-24 NOTE — Telephone Encounter (Signed)
Pt says yesterday morning her sugar was 169 at 7:30, she said before she ate she felt light headed and dizzy and then it jumped to 306 at 8:15 am. She said her sugar is going up and down,  at 7:07 this morning it was 175, at 10:18 it was 215, in 30 minutes it went up to 230 at 12:01 it was 190.  She said she keeps feeling light headed when it changes.  Wants to know what to do?

## 2013-02-24 NOTE — Telephone Encounter (Signed)
Pt aware of recs. rx sent 

## 2013-02-24 NOTE — Telephone Encounter (Signed)
I called and spoke with pt. She reports she is coughing a lot. She brings up clear-white phlem. Maybe one day it had a shade of green phlem. This has been going on x 2 weeks. Also c/o chest tx. She has been taking phenergan w/ codeine PRN. She is wanting to know if there is anything else to take or do? Please advise Dr. Annamaria Boots thanks Pending 07/2013 Allergies  Allergen Reactions  . Clidinium-Chlordiazepoxide Other (See Comments)    Sleepy,weak  . Biaxin [Clarithromycin]     Foul taste, abd pain, diarrhea  . Codeine     REACTION: gi upset  . Tramadol

## 2013-02-24 NOTE — Telephone Encounter (Signed)
Dizzines is not from the sugar She can try taking meclizine if she feels swimmy headed Needs to take her Humalog with her breakfast as soon as possible after waking up and take additional 2-3 units more than what she is normally taking

## 2013-03-05 ENCOUNTER — Telehealth: Payer: Self-pay | Admitting: *Deleted

## 2013-03-05 ENCOUNTER — Other Ambulatory Visit: Payer: Self-pay | Admitting: *Deleted

## 2013-03-05 MED ORDER — ALPRAZOLAM 0.5 MG PO TABS: 0.2500 mg | ORAL_TABLET | Freq: Every evening | ORAL | Status: DC | PRN

## 2013-03-05 NOTE — Telephone Encounter (Signed)
Pt says she would like to come in today but she has no one to drive her, she wants to know what to do.  bp this morning was 145/103, 176/75.

## 2013-03-05 NOTE — Telephone Encounter (Signed)
Check BP again, if still over 150 on the top number or 90 on the bottom number take additional dose of Atacand and Xanax

## 2013-03-05 NOTE — Telephone Encounter (Signed)
Pt is aware, rx sent

## 2013-03-06 ENCOUNTER — Telehealth: Payer: Self-pay | Admitting: Endocrinology

## 2013-03-06 NOTE — Telephone Encounter (Signed)
Please call pt regarding BP / Barbera Setters

## 2013-03-07 ENCOUNTER — Telehealth: Payer: Self-pay | Admitting: Endocrinology

## 2013-03-07 NOTE — Telephone Encounter (Signed)
Please call pt, patient says BP is now running low. She is worried / [name] S.

## 2013-03-07 NOTE — Telephone Encounter (Signed)
Patient wants a phone, she says her BP is now running low and she's worried

## 2013-03-07 NOTE — Telephone Encounter (Signed)
She gets shaky and blood pressure is higher, probably more in the morning. If she takes an extra attic and her blood pressure will get relatively low and she feels weak. Today blood pressure is about 170/90, pulse 98. Advised her to take half a Xanax now and if blood pressure still high in the afternoon to take extra metoprolol From tomorrow change Atacand to half tablet twice a day

## 2013-03-07 NOTE — Telephone Encounter (Signed)
Left meesage at 4:52pm on 10/30

## 2013-03-07 NOTE — Telephone Encounter (Signed)
What is the blood pressure reading. Do not need to do anything specific except rest and increase fluid intake and high sodium foods

## 2013-03-07 NOTE — Telephone Encounter (Signed)
Pt says she thinks she has figured out what is causing adn BP reading, please call back [phone] / Sherri S.

## 2013-03-07 NOTE — Telephone Encounter (Signed)
Says Dr. Dwyane Dee called late yesterday afternoon, she missed his call. Says BP does not seem improved today. Please call.  / Sherri S.

## 2013-03-18 ENCOUNTER — Telehealth: Payer: Self-pay | Admitting: Endocrinology

## 2013-03-18 NOTE — Telephone Encounter (Signed)
Please call regarding BP and BS / Sherri

## 2013-03-18 NOTE — Telephone Encounter (Signed)
busy

## 2013-03-19 ENCOUNTER — Telehealth: Payer: Self-pay | Admitting: *Deleted

## 2013-03-19 ENCOUNTER — Other Ambulatory Visit: Payer: Self-pay | Admitting: *Deleted

## 2013-03-19 MED ORDER — VALSARTAN 80 MG PO TABS: 80.0000 mg | ORAL_TABLET | Freq: Every day | ORAL | Status: DC

## 2013-03-19 NOTE — Telephone Encounter (Signed)
She can stop the Atacand and start generic Diovan 80 mg daily in the evening   She should not worry about the blood sugar unless it is persistently high or low

## 2013-03-19 NOTE — Telephone Encounter (Signed)
Pt is aware, rx sent for diovan

## 2013-03-19 NOTE — Telephone Encounter (Signed)
Pt says she is still having problems with her bp, now she says it's going down very low,  She also says her blood sugars are very high, I asked her if she wants to come in to see you, she said if she can get a ride, but is unsure on what else you can do for her?  She was coughing impressively while I was on the phone.  Her sugars have been as low as 126 and as high as 201.  She thinks you should do some extensive blood work to find out what's wrong with her. She says if you don't want to fool with it, please send her to someone who can help her with her BP.

## 2013-03-27 ENCOUNTER — Telehealth: Payer: Self-pay | Admitting: Endocrinology

## 2013-03-27 ENCOUNTER — Telehealth: Payer: Self-pay | Admitting: *Deleted

## 2013-03-27 NOTE — Telephone Encounter (Signed)
Pt called about her bp again, she says it's read as follows: 156/70 142/52 162/66 146/59 She also wants to know if you think the diovan is working? She is also c/o of a stiff neck, the orthopedic Dr. Hanley Seamen her mobic, she thinks it contains a lot of aspirin and she wants to make sure its okay for her to take?

## 2013-03-27 NOTE — Telephone Encounter (Signed)
Increase to 2 daily  Mobic ok for a few days, need to watch for stomach irritation

## 2013-03-27 NOTE — Telephone Encounter (Signed)
Pt would like Dr. Ronnie Derby nurse to call Suanne Marker) Call back [phone]  Thank You :)

## 2013-03-28 NOTE — Telephone Encounter (Signed)
Pt says she would like to wait a few more days before increasing the diovan, she said when her bp gets to 120 she can't function and doesn't want it to get to low.

## 2013-03-28 NOTE — Telephone Encounter (Signed)
noted 

## 2013-04-11 ENCOUNTER — Telehealth: Payer: Self-pay | Admitting: Internal Medicine

## 2013-04-11 NOTE — Telephone Encounter (Signed)
I called and spoke with pt. She was requesting phenergan with codeine cough syrup refill. I advised her this now had to be picked up from our office. Pt reports she has no way. She then asked about tussionex and I advised her d/t the new changed by FDA we no longer can call these medications in. The RX has to be picked up. Pt reports she can;t drive bc she still black out at times. She wanted to know what she could take. I advised her OTC delsym, mucinex, or robitussin. She reports those do not help her. She then stated she will just do without bc she can't come here for RX. Dr. Annamaria Boots any other recs for pt? Thanks  Allergies  Allergen Reactions  . Chlordiazepoxide-Clidinium Other (See Comments)    Sleepy,weak  . Biaxin [Clarithromycin]     Foul taste, abd pain, diarrhea  . Codeine     REACTION: gi upset  . Tramadol

## 2013-04-14 MED ORDER — PROMETHAZINE-CODEINE 6.25-10 MG/5ML PO SYRP: 5.0000 mL | ORAL_SOLUTION | Freq: Four times a day (QID) | ORAL | Status: DC | PRN

## 2013-04-14 NOTE — Telephone Encounter (Signed)
We can mail her a script for the phenergan codeine cough syrup, if she has a way to get it filled.

## 2013-04-14 NOTE — Telephone Encounter (Signed)
I called and spoke with pt. She reports this is fine being ,ailed to her. I have printed this out and will have CDY sign this and mail to her. Nothing further needed

## 2013-04-23 ENCOUNTER — Other Ambulatory Visit: Payer: Self-pay | Admitting: *Deleted

## 2013-04-23 MED ORDER — METOPROLOL SUCCINATE ER 25 MG PO TB24: 25.0000 mg | ORAL_TABLET | Freq: Every day | ORAL | Status: DC

## 2013-05-09 ENCOUNTER — Other Ambulatory Visit: Payer: Self-pay | Admitting: *Deleted

## 2013-05-09 MED ORDER — KETOCONAZOLE 2 % EX CREA: TOPICAL_CREAM | Freq: Every day | CUTANEOUS | Status: DC

## 2013-05-10 ENCOUNTER — Inpatient Hospital Stay (HOSPITAL_COMMUNITY)
Admission: EM | Admit: 2013-05-10 | Discharge: 2013-05-13 | DRG: 149 | Disposition: A | Discharge status: Home / Self Care | Payer: Medicare Other | Attending: Family Medicine | Admitting: Family Medicine

## 2013-05-10 DIAGNOSIS — J329 Chronic sinusitis, unspecified: Secondary | ICD-10-CM | POA: Diagnosis not present

## 2013-05-10 DIAGNOSIS — K589 Irritable bowel syndrome without diarrhea: Secondary | ICD-10-CM

## 2013-05-10 DIAGNOSIS — J309 Allergic rhinitis, unspecified: Secondary | ICD-10-CM

## 2013-05-10 DIAGNOSIS — H6690 Otitis media, unspecified, unspecified ear: Secondary | ICD-10-CM | POA: Diagnosis present

## 2013-05-10 DIAGNOSIS — I517 Cardiomegaly: Secondary | ICD-10-CM | POA: Diagnosis not present

## 2013-05-10 DIAGNOSIS — R059 Cough, unspecified: Secondary | ICD-10-CM

## 2013-05-10 DIAGNOSIS — R42 Dizziness and giddiness: Secondary | ICD-10-CM | POA: Diagnosis not present

## 2013-05-10 DIAGNOSIS — I739 Peripheral vascular disease, unspecified: Secondary | ICD-10-CM | POA: Diagnosis present

## 2013-05-10 DIAGNOSIS — IMO0001 Reserved for inherently not codable concepts without codable children: Secondary | ICD-10-CM | POA: Diagnosis not present

## 2013-05-10 DIAGNOSIS — R490 Dysphonia: Secondary | ICD-10-CM

## 2013-05-10 DIAGNOSIS — K219 Gastro-esophageal reflux disease without esophagitis: Secondary | ICD-10-CM

## 2013-05-10 DIAGNOSIS — H409 Unspecified glaucoma: Secondary | ICD-10-CM | POA: Diagnosis present

## 2013-05-10 DIAGNOSIS — R29818 Other symptoms and signs involving the nervous system: Secondary | ICD-10-CM

## 2013-05-10 DIAGNOSIS — J449 Chronic obstructive pulmonary disease, unspecified: Secondary | ICD-10-CM | POA: Diagnosis present

## 2013-05-10 DIAGNOSIS — R05 Cough: Secondary | ICD-10-CM

## 2013-05-10 DIAGNOSIS — J4489 Other specified chronic obstructive pulmonary disease: Secondary | ICD-10-CM | POA: Diagnosis present

## 2013-05-10 DIAGNOSIS — E785 Hyperlipidemia, unspecified: Secondary | ICD-10-CM

## 2013-05-10 DIAGNOSIS — E1140 Type 2 diabetes mellitus with diabetic neuropathy, unspecified: Secondary | ICD-10-CM

## 2013-05-10 DIAGNOSIS — E1149 Type 2 diabetes mellitus with other diabetic neurological complication: Secondary | ICD-10-CM | POA: Diagnosis present

## 2013-05-10 DIAGNOSIS — R404 Transient alteration of awareness: Secondary | ICD-10-CM | POA: Diagnosis not present

## 2013-05-10 DIAGNOSIS — R000 Tachycardia, unspecified: Secondary | ICD-10-CM | POA: Diagnosis not present

## 2013-05-10 DIAGNOSIS — N390 Urinary tract infection, site not specified: Secondary | ICD-10-CM | POA: Diagnosis not present

## 2013-05-10 DIAGNOSIS — J019 Acute sinusitis, unspecified: Secondary | ICD-10-CM | POA: Diagnosis not present

## 2013-05-10 DIAGNOSIS — K7689 Other specified diseases of liver: Secondary | ICD-10-CM

## 2013-05-10 DIAGNOSIS — R079 Chest pain, unspecified: Secondary | ICD-10-CM

## 2013-05-10 DIAGNOSIS — J310 Chronic rhinitis: Secondary | ICD-10-CM

## 2013-05-10 DIAGNOSIS — H8110 Benign paroxysmal vertigo, unspecified ear: Secondary | ICD-10-CM

## 2013-05-10 DIAGNOSIS — IMO0002 Reserved for concepts with insufficient information to code with codable children: Secondary | ICD-10-CM | POA: Diagnosis present

## 2013-05-10 DIAGNOSIS — E1165 Type 2 diabetes mellitus with hyperglycemia: Secondary | ICD-10-CM | POA: Diagnosis present

## 2013-05-10 DIAGNOSIS — I498 Other specified cardiac arrhythmias: Secondary | ICD-10-CM | POA: Diagnosis not present

## 2013-05-10 DIAGNOSIS — I119 Hypertensive heart disease without heart failure: Secondary | ICD-10-CM | POA: Diagnosis not present

## 2013-05-10 LAB — CBC WITH DIFFERENTIAL/PLATELET
BASOS PCT: 0 % (ref 0–1)
Basophils Absolute: 0 10*3/uL (ref 0.0–0.1)
EOS ABS: 0.4 10*3/uL (ref 0.0–0.7)
Eosinophils Relative: 4 % (ref 0–5)
HCT: 41.4 % (ref 36.0–46.0)
HEMOGLOBIN: 13.5 g/dL (ref 12.0–15.0)
Lymphocytes Relative: 24 % (ref 12–46)
Lymphs Abs: 2.5 10*3/uL (ref 0.7–4.0)
MCH: 28.3 pg (ref 26.0–34.0)
MCHC: 32.6 g/dL (ref 30.0–36.0)
MCV: 86.8 fL (ref 78.0–100.0)
MONOS PCT: 5 % (ref 3–12)
Monocytes Absolute: 0.6 10*3/uL (ref 0.1–1.0)
NEUTROS PCT: 66 % (ref 43–77)
Neutro Abs: 6.9 10*3/uL (ref 1.7–7.7)
Platelets: 174 10*3/uL (ref 150–400)
RBC: 4.77 MIL/uL (ref 3.87–5.11)
RDW: 13.6 % (ref 11.5–15.5)
WBC: 10.4 10*3/uL (ref 4.0–10.5)

## 2013-05-10 LAB — URINALYSIS W MICROSCOPIC + REFLEX CULTURE
BILIRUBIN URINE: NEGATIVE
Glucose, UA: NEGATIVE mg/dL
Ketones, ur: NEGATIVE mg/dL
NITRITE: POSITIVE — AB
PROTEIN: NEGATIVE mg/dL
Specific Gravity, Urine: 1.006 (ref 1.005–1.030)
Urobilinogen, UA: 0.2 mg/dL (ref 0.0–1.0)
pH: 6 (ref 5.0–8.0)

## 2013-05-10 LAB — COMPREHENSIVE METABOLIC PANEL
ALT: 16 U/L (ref 0–35)
AST: 22 U/L (ref 0–37)
Albumin: 3.9 g/dL (ref 3.5–5.2)
Alkaline Phosphatase: 52 U/L (ref 39–117)
BILIRUBIN TOTAL: 0.3 mg/dL (ref 0.3–1.2)
BUN: 12 mg/dL (ref 6–23)
CALCIUM: 10 mg/dL (ref 8.4–10.5)
CO2: 31 meq/L (ref 19–32)
Chloride: 93 mEq/L — ABNORMAL LOW (ref 96–112)
Creatinine, Ser: 0.52 mg/dL (ref 0.50–1.10)
GLUCOSE: 155 mg/dL — AB (ref 70–99)
Potassium: 4.1 mEq/L (ref 3.7–5.3)
Sodium: 135 mEq/L — ABNORMAL LOW (ref 137–147)
Total Protein: 7.7 g/dL (ref 6.0–8.3)

## 2013-05-10 LAB — POCT I-STAT TROPONIN I: TROPONIN I, POC: 0 ng/mL (ref 0.00–0.08)

## 2013-05-10 MED ORDER — DIAZEPAM 5 MG PO TABS
5.0000 mg | ORAL_TABLET | Freq: Once | ORAL | Status: AC
  Administered 2013-05-10: 5 mg via ORAL
  Filled 2013-05-10 (×2): qty 1

## 2013-05-10 MED ORDER — SODIUM CHLORIDE 0.9 % IV BOLUS (SEPSIS)
500.0000 mL | Freq: Once | INTRAVENOUS | Status: AC
  Administered 2013-05-10: 500 mL via INTRAVENOUS

## 2013-05-10 MED ORDER — DEXTROSE 5 % IV SOLN
1.0000 g | Freq: Once | INTRAVENOUS | Status: AC
  Administered 2013-05-10: 1 g via INTRAVENOUS
  Filled 2013-05-10: qty 10

## 2013-05-10 NOTE — ED Notes (Signed)
Patient here tonight with complaint of increasing dizziness throughout today. Started around 11am after returning from errands, and progressively got worse until she felt to unsteady to stand. No weakness, SOB, NV, or other Cardiac Symptoms reported. Patient wears 2L via [place] at baseline. Reported cardiac rhythm was Sinus Tach with RBBB.

## 2013-05-10 NOTE — ED Provider Notes (Signed)
CSN: [id number]     Arrival date & time 05/10/13  2130 History   First MD Initiated Contact with Patient 05/10/13 2136     Chief Complaint  Patient presents with  . Dizziness   (Consider location/radiation/quality/duration/timing/severity/associated sxs/prior Treatment) HPI [name] is a 77 y.o. female who presents to emergency department complaining of episodes of dizziness. Patient states that her symptoms began 10 months ago. She was seen in emergency department at that time. She states that she was referred to neurologist and she has seen her primary care Dr. who is her endocrinologist for the same. She states the neurologist told her that everything looked good. She had a carotid Doppler done, a CT, lab work and was told that they could not find the cause of her dizziness. She was started on meclizine for possible vertigo. Patient also states that she was prescribed Xanax for possible panic attacks. Patient states that she has had these dizzy spells since then. She states that come and go. She states that they have increased in frequency and now she hasn't daily. She states that now she is unable to drive or even go out of the house when she feels dizzy. She still scribe his dizzy spells as everything around her is moving. She states that when she has the spells she has difficulty walking or even moving. She states she had severe episode today, states that he wasn't improving and not so she came to emergency department. She denies any associated symptoms. She denies any chest pain, shortness of breath, changes in vision. She states that her symptoms began after she had an elbow surgery. She states that they did I nerve block and whole arm by injecting into the neck, and believes that that is what caused all her symptoms. She states that she took meclizine this morning with no improvement.  Past Medical History  Diagnosis Date  . Irritable bowel syndrome   . Other chronic nonalcoholic liver  disease   . Esophageal reflux   . Allergic rhinitis, cause unspecified     Sinus CT Rec 12-23-2009  . 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 Sciatica   . Shingles 2010  . Steatohepatitis   . Diabetes mellitus   . Diverticulosis   . Hiatal hernia   . Esophageal stricture   . Diarrhea   . GERD (gastroesophageal reflux disease)   . Shortness of breath   . Hypertension   . Arthritis   . Glaucoma   . Complication of anesthesia     takes along time to wake up  . Oxygen deficiency   . Benign paroxysmal positional vertigo 08/09/2012  . Neuropathy in diabetes 08/09/2012   Past Surgical History  Procedure Laterality Date  . Cholecystectomy open  1978  . Liver biopsy  08-1992  . Parathyroid exploration    . Tonsillectomy and adenoidectomy    . Total abdominal hysterectomy    . Esophagogastroduodenoscopy  1779,39-03    H Hernia,es.stricture s/p dil 68F  . Colonoscopy  07-2001    mild diverticulosis  . Carpal tunnel release      right hand  . Cataract extraction      Bilateral  . Ulnar nerve transposition  12/07/2011    Procedure: ULNAR NERVE DECOMPRESSION/TRANSPOSITION;this was cancelled-not done  Surgeon: Cammie Sickle., MD;  Location: [facility];  Service: Orthopedics;  Laterality: Right;  right ulnar nerve in situ decompression  .  Ulnar tunnel release  03/07/2012    Procedure: CUBITAL TUNNEL RELEASE;  Surgeon: Roseanne Kaufman, MD;  Location: [facility];  Service: Orthopedics;  Laterality: Right;  ulnar nerve release at the elbow     Family History  Problem Relation Age of Onset  . Lung cancer Mother     small cell;Byssinosis  . Heart disease Father   . Lung cancer Sister   . Liver cancer Sister     ? mets from another area of the body  . Diabetes      grandmother  . Stroke Maternal Grandfather    History  Substance Use Topics  . Smoking status: Former Smoker    Types: Cigarettes     Quit date: 05/08/1988  . Smokeless tobacco: Never Used  . Alcohol Use: No   OB History   Grav Para Term Preterm Abortions TAB SAB Ect Mult Living                 Review of Systems  Constitutional: Negative for fever and chills.  HENT: Negative for congestion and ear pain.   Eyes: Negative for visual disturbance.  Respiratory: Negative for cough, chest tightness and shortness of breath.   Cardiovascular: Negative for chest pain, palpitations and leg swelling.  Gastrointestinal: Negative for nausea, vomiting, abdominal pain and diarrhea.  Genitourinary: Negative for dysuria, flank pain, vaginal bleeding, vaginal discharge, vaginal pain and pelvic pain.  Musculoskeletal: Negative for arthralgias, myalgias, neck pain and neck stiffness.  Skin: Negative for rash.  Neurological: Positive for dizziness and light-headedness. Negative for facial asymmetry, weakness, numbness and headaches.  All other systems reviewed and are negative.    Allergies  Chlordiazepoxide-clidinium; Biaxin; Codeine; and Tramadol  Home Medications   Current Outpatient Rx  Name  Route  Sig  Dispense  Refill  . acetaminophen (TYLENOL) 500 MG tablet   Oral   Take 500 mg by mouth every 6 (six) hours as needed for pain.          Marland Kitchen ALPRAZolam (XANAX) 0.5 MG tablet   Oral   Take 0.5 tablets (0.25 mg total) by mouth at bedtime as needed for anxiety.   30 tablet   5   . Alum & Mag Hydroxide-Simeth (MAGIC MOUTHWASH) SOLN   Oral   Take 10 mLs by mouth daily as needed (as needed to help with acid reflux per Dr. Delfin Edis).         Marland Kitchen aspirin 325 MG tablet   Oral   Take 325 mg by mouth daily.           . candesartan (ATACAND) 32 MG tablet   Oral   Take 0.5 tablets (16 mg total) by mouth daily. Take 16 mg daily   30 tablet   5     Please note I am unable to receive electronic rx r ...   . cholestyramine (QUESTRAN) 4 G packet   Oral   Take 1 packet by mouth. Take 1 packet as needed by mouth.          . diazepam (VALIUM) 2 MG tablet   Oral   Take 1 tablet (2 mg total) by mouth every 6 (six) hours as needed for anxiety.   30 tablet   1   . dicyclomine (BENTYL) 10 MG capsule   Oral   Take 1 capsule (10 mg total) by mouth daily as needed.   30 capsule   5   . diphenoxylate-atropine (LOMOTIL) 2.5-0.025 MG per tablet  Take 1 tablet by mouth by mouth every 6 hours   15 tablet   0   . erythromycin ophthalmic ointment               . fenofibrate micronized (LOFIBRA) 134 MG capsule   Oral   Take 1 capsule (134 mg total) by mouth daily before breakfast.   30 capsule   5   . insulin aspart (NOVOLOG PENFILL) 100 UNIT/ML injection   Subcutaneous   Inject 0-12 Units into the skin 3 (three) times daily before meals. SSI         . insulin glargine (LANTUS) 100 UNIT/ML injection   Subcutaneous   Inject 29-38 Units into the skin daily. Inject 42units every morning and 31 units at supper time.         Marland Kitchen ipratropium (ATROVENT HFA) 17 MCG/ACT inhaler   Inhalation   Inhale 2 puffs into the lungs 2 (two) times daily as needed (shortness of breath).   1 Inhaler   11   . ketoconazole (NIZORAL) 2 % cream   Topical   Apply topically daily.   60 g   0   . levalbuterol (XOPENEX HFA) 45 MCG/ACT inhaler   Inhalation   Inhale 2 puffs into the lungs every 6 (six) hours as needed for wheezing or shortness of breath.   1 Inhaler   11   . Loperamide HCl (IMODIUM PO)   Oral   Take 1 tablet by mouth daily as needed (loose stool.).          Marland Kitchen loratadine (CLARITIN) 10 MG tablet   Oral   Take 1 tablet (10 mg total) by mouth daily.   30 tablet   11   . meclizine (ANTIVERT) 25 MG tablet   Oral   Take 25 mg by mouth 3 (three) times daily as needed for dizziness.          . metoprolol succinate (TOPROL-XL) 25 MG 24 hr tablet   Oral   Take 1 tablet (25 mg total) by mouth daily.   30 tablet   5   . omeprazole (PRILOSEC) 40 MG capsule   Oral   Take 20 mg by mouth  daily. Take one capsule 2 times a day         . pravastatin (PRAVACHOL) 40 MG tablet               . predniSONE (DELTASONE) 20 MG tablet   Oral   Take 1 tablet (20 mg total) by mouth daily.   4 tablet   0   . promethazine-codeine (PHENERGAN WITH CODEINE) 6.25-10 MG/5ML syrup   Oral   Take 5 mLs by mouth every 6 (six) hours as needed for cough.   120 mL   0   . tiotropium (SPIRIVA HANDIHALER) 18 MCG inhalation capsule   Inhalation   Place 1 capsule (18 mcg total) into inhaler and inhale daily.   30 capsule   prn   . valsartan (DIOVAN) 80 MG tablet   Oral   Take 1 tablet (80 mg total) by mouth daily.   30 tablet   5    BP 131/65  Pulse 116  Temp(Src) 98.2 F (36.8 C) (Oral)  Resp 20  SpO2 99% Physical Exam  Nursing note and vitals reviewed. Constitutional: She is oriented to person, place, and time. She appears well-developed and well-nourished. No distress.  Eyes: Conjunctivae and EOM are normal. Pupils are equal, round, and reactive to light.  Neck:  Neck supple.  Neurological: She is alert and oriented to person, place, and time. No cranial nerve deficit. Coordination normal.  5/5 and equal upper and lower extremity strength bilaterally. Equal grip strength bilaterally. Normal finger to nose and heel to shin. No pronator drift.  Skin: Skin is warm and dry.    ED Course  Procedures (including critical care time) Labs Review Labs Reviewed  COMPREHENSIVE METABOLIC PANEL - Abnormal; Notable for the following:    Sodium 135 (*)    Chloride 93 (*)    Glucose, Bld 155 (*)    All other components within normal limits  URINALYSIS W MICROSCOPIC + REFLEX CULTURE - Abnormal; Notable for the following:    APPearance CLOUDY (*)    Hgb urine dipstick SMALL (*)    Nitrite POSITIVE (*)    Leukocytes, UA SMALL (*)    Bacteria, UA MANY (*)    All other components within normal limits  GLUCOSE, CAPILLARY - Abnormal; Notable for the following:    Glucose-Capillary 157  (*)    All other components within normal limits  URINE CULTURE  CBC WITH DIFFERENTIAL  POCT I-STAT TROPONIN I   Imaging Review Ct Head Wo Contrast  05/11/2013   CLINICAL DATA:  Dizziness.  Unable to stand.  EXAM: CT HEAD WITHOUT CONTRAST  TECHNIQUE: Contiguous axial images were obtained from the base of the skull through the vertex without intravenous contrast.  COMPARISON:  07/19/2012  FINDINGS: There is no evidence of intracranial hemorrhage, brain edema, or other signs of acute infarction. There is no evidence of intracranial mass lesion or mass effect. No abnormal extraaxial fluid collections are identified.  Mild diffuse cerebral atrophy and chronic small vessel disease is stable in appearance. Ventricles are normal in size. No skull abnormality identified. Increased opacification of right ethmoid sinuses noted as well as increased mucosal thickening involving the sphenoid sinus.  IMPRESSION: No acute intracranial findings.  Stable mild cerebral atrophy and chronic small vessel disease.  Increased right ethmoid and sphenoid sinus disease.   Electronically Signed   By: Earle Gell M.D.   On: 05/11/2013 00:31    EKG Interpretation    Date/Time:  Saturday May 10 2013 22:31:58 EST Ventricular Rate:  111 PR Interval:  162 QRS Duration: 139 QT Interval:  368 QTC Calculation: 500 R Axis:   94 Text Interpretation:  Sinus tachycardia RBBB and LPFB No significant change since last tracing Confirmed by KNAPP  MD-J, JON ([id number]) on 05/10/2013 10:51:41 PM            MDM   1. Vertigo   2. Tachycardia   3. Sinusitis   4. UTI (lower urinary tract infection)     Pt with episodes of dizziness, today, unable to get up and walk around her house. Called EMS, unable to open the door for them, it had to be knocked down. Pt already had meclizine at home. Valium ordered. Chart review showed this is a chronic issue. She has seen her PCP, neurology, and ENT for this. She has not had an MRI. Given  worsening symptoms, labs and CT ordered. Discussed with Dr. Tomi Bamberger. Pt also found to be tachycardic at 110-120s. Will start IV fluids.   1:00 AM CT showed ehtmoid and sphenoid sinus disease. This could be causing possible worsening vertigo. Pt feels better after 16m valium PO, however unable to still walk on her own. She did get up with a tech and walked to the bathroom which is in her room, with assistance.  She did complain of dizziness and felt like she was going to fall. Her UA also back nitrite positive and many bacteria. Pt admits that she has frequent UTIs. i ordered her rocephin. Given pt's worsening symptoms, UTI, inability to ambulate, and persistent tachycardia even after iv fluids, will admit.   Filed Vitals:   05/10/13 2234 05/10/13 2236 05/10/13 2237 05/10/13 2315  BP: 145/70 136/63 110/57 136/63  Pulse: 115 116 118 112  Temp:      TempSrc:      Resp:    23  SpO2:    100%    1:19 AM  Reassessed pt's ears. No signs of infection. Pt id admit to fluid like and popping sensation in the right. Consistent with her CT scan.     Renold Genta, PA-C 05/11/13 0120

## 2013-05-11 ENCOUNTER — Encounter (HOSPITAL_COMMUNITY): Payer: Self-pay | Admitting: *Deleted

## 2013-05-11 ENCOUNTER — Emergency Department (HOSPITAL_COMMUNITY): Payer: Medicare Other

## 2013-05-11 DIAGNOSIS — E785 Hyperlipidemia, unspecified: Secondary | ICD-10-CM

## 2013-05-11 DIAGNOSIS — R000 Tachycardia, unspecified: Secondary | ICD-10-CM | POA: Diagnosis present

## 2013-05-11 DIAGNOSIS — R42 Dizziness and giddiness: Secondary | ICD-10-CM | POA: Diagnosis not present

## 2013-05-11 DIAGNOSIS — J329 Chronic sinusitis, unspecified: Secondary | ICD-10-CM | POA: Diagnosis present

## 2013-05-11 DIAGNOSIS — N390 Urinary tract infection, site not specified: Secondary | ICD-10-CM | POA: Diagnosis present

## 2013-05-11 LAB — HEMOGLOBIN A1C
Hgb A1c MFr Bld: 9 % — ABNORMAL HIGH (ref <5.7)
MEAN PLASMA GLUCOSE: 212 mg/dL — AB (ref <117)

## 2013-05-11 LAB — RAPID URINE DRUG SCREEN, HOSP PERFORMED
Amphetamines: NOT DETECTED
BENZODIAZEPINES: NOT DETECTED
Barbiturates: NOT DETECTED
Cocaine: NOT DETECTED
OPIATES: POSITIVE — AB
Tetrahydrocannabinol: NOT DETECTED

## 2013-05-11 LAB — GLUCOSE, CAPILLARY
GLUCOSE-CAPILLARY: 232 mg/dL — AB (ref 70–99)
GLUCOSE-CAPILLARY: 252 mg/dL — AB (ref 70–99)
GLUCOSE-CAPILLARY: 282 mg/dL — AB (ref 70–99)
Glucose-Capillary: 157 mg/dL — ABNORMAL HIGH (ref 70–99)
Glucose-Capillary: 141 mg/dL — ABNORMAL HIGH (ref 70–99)

## 2013-05-11 LAB — CBC
HEMATOCRIT: 39 % (ref 36.0–46.0)
Hemoglobin: 12.7 g/dL (ref 12.0–15.0)
MCH: 28.5 pg (ref 26.0–34.0)
MCHC: 32.6 g/dL (ref 30.0–36.0)
MCV: 87.4 fL (ref 78.0–100.0)
PLATELETS: 165 10*3/uL (ref 150–400)
RBC: 4.46 MIL/uL (ref 3.87–5.11)
RDW: 13.8 % (ref 11.5–15.5)
WBC: 10.3 10*3/uL (ref 4.0–10.5)

## 2013-05-11 LAB — BASIC METABOLIC PANEL
BUN: 11 mg/dL (ref 6–23)
CALCIUM: 9.4 mg/dL (ref 8.4–10.5)
CHLORIDE: 100 meq/L (ref 96–112)
CO2: 33 meq/L — AB (ref 19–32)
Creatinine, Ser: 0.54 mg/dL (ref 0.50–1.10)
GFR calc Af Amer: >90 mL/min (ref >90)
GFR calc non Af Amer: >90 mL/min (ref >90)
GLUCOSE: 195 mg/dL — AB (ref 70–99)
Potassium: 3.9 mEq/L (ref 3.7–5.3)
SODIUM: 142 meq/L (ref 137–147)

## 2013-05-11 LAB — TSH: TSH: 4.572 u[IU]/mL — ABNORMAL HIGH (ref 0.350–4.500)

## 2013-05-11 MED ORDER — ACETAMINOPHEN 650 MG RE SUPP: 650.0000 mg | Freq: Four times a day (QID) | RECTAL | Status: DC | PRN

## 2013-05-11 MED ORDER — OXYCODONE HCL 5 MG PO TABS: 5.0000 mg | ORAL_TABLET | ORAL | Status: DC | PRN

## 2013-05-11 MED ORDER — INSULIN GLARGINE 100 UNIT/ML ~~LOC~~ SOLN
31.0000 [IU] | Freq: Every day | SUBCUTANEOUS | Status: DC
  Administered 2013-05-11 – 2013-05-12 (×2): 31 [IU] via SUBCUTANEOUS
  Filled 2013-05-11 (×3): qty 0.31

## 2013-05-11 MED ORDER — HYDROMORPHONE HCL PF 1 MG/ML IJ SOLN
0.5000 mg | INTRAMUSCULAR | Status: DC | PRN
  Filled 2013-05-11: qty 1

## 2013-05-11 MED ORDER — ONDANSETRON HCL 4 MG PO TABS: 4.0000 mg | ORAL_TABLET | Freq: Four times a day (QID) | ORAL | Status: DC | PRN

## 2013-05-11 MED ORDER — IPRATROPIUM BROMIDE HFA 17 MCG/ACT IN AERS
2.0000 | INHALATION_SPRAY | Freq: Two times a day (BID) | RESPIRATORY_TRACT | Status: DC | PRN
  Filled 2013-05-11: qty 12.9

## 2013-05-11 MED ORDER — ONDANSETRON HCL 4 MG/2ML IJ SOLN: 4.0000 mg | Freq: Three times a day (TID) | INTRAMUSCULAR | Status: AC | PRN

## 2013-05-11 MED ORDER — ACETAMINOPHEN 325 MG PO TABS
650.0000 mg | ORAL_TABLET | Freq: Four times a day (QID) | ORAL | Status: DC | PRN
  Administered 2013-05-11 – 2013-05-13 (×2): 650 mg via ORAL
  Filled 2013-05-11 (×2): qty 2

## 2013-05-11 MED ORDER — MECLIZINE HCL 25 MG PO TABS
25.0000 mg | ORAL_TABLET | Freq: Three times a day (TID) | ORAL | Status: DC | PRN
  Filled 2013-05-11: qty 1

## 2013-05-11 MED ORDER — IRBESARTAN 75 MG PO TABS
75.0000 mg | ORAL_TABLET | Freq: Every day | ORAL | Status: DC
  Administered 2013-05-11 – 2013-05-13 (×3): 75 mg via ORAL
  Filled 2013-05-11 (×3): qty 1

## 2013-05-11 MED ORDER — SODIUM CHLORIDE 0.9 % IV SOLN: INTRAVENOUS | Status: DC

## 2013-05-11 MED ORDER — ASPIRIN 325 MG PO TABS
325.0000 mg | ORAL_TABLET | Freq: Every day | ORAL | Status: DC
  Administered 2013-05-11 – 2013-05-13 (×3): 325 mg via ORAL
  Filled 2013-05-11 (×3): qty 1

## 2013-05-11 MED ORDER — SODIUM CHLORIDE 0.9 % IV SOLN
INTRAVENOUS | Status: DC
  Administered 2013-05-11: 03:00:00 via INTRAVENOUS

## 2013-05-11 MED ORDER — INSULIN ASPART 100 UNIT/ML ~~LOC~~ SOLN
0.0000 [IU] | Freq: Three times a day (TID) | SUBCUTANEOUS | Status: DC
  Administered 2013-05-11: 3 [IU] via SUBCUTANEOUS
  Administered 2013-05-11: 17:00:00 via SUBCUTANEOUS
  Administered 2013-05-12: 5 [IU] via SUBCUTANEOUS
  Administered 2013-05-12: 2 [IU] via SUBCUTANEOUS
  Administered 2013-05-12 – 2013-05-13 (×2): 5 [IU] via SUBCUTANEOUS
  Administered 2013-05-13: 3 [IU] via SUBCUTANEOUS

## 2013-05-11 MED ORDER — SODIUM CHLORIDE 0.9 % IJ SOLN
3.0000 mL | Freq: Two times a day (BID) | INTRAMUSCULAR | Status: DC
  Administered 2013-05-12: 3 mL via INTRAVENOUS

## 2013-05-11 MED ORDER — LEVALBUTEROL TARTRATE 45 MCG/ACT IN AERO
2.0000 | INHALATION_SPRAY | Freq: Four times a day (QID) | RESPIRATORY_TRACT | Status: DC | PRN
  Filled 2013-05-11: qty 15

## 2013-05-11 MED ORDER — PANTOPRAZOLE SODIUM 40 MG PO TBEC
40.0000 mg | DELAYED_RELEASE_TABLET | Freq: Every day | ORAL | Status: DC
  Administered 2013-05-11 – 2013-05-13 (×3): 40 mg via ORAL
  Filled 2013-05-11 (×2): qty 1

## 2013-05-11 MED ORDER — ENOXAPARIN SODIUM 40 MG/0.4ML ~~LOC~~ SOLN
40.0000 mg | SUBCUTANEOUS | Status: DC
  Administered 2013-05-12 – 2013-05-13 (×2): 40 mg via SUBCUTANEOUS
  Filled 2013-05-11 (×3): qty 0.4

## 2013-05-11 MED ORDER — DEXTROSE 5 % IV SOLN
1.0000 g | INTRAVENOUS | Status: DC
  Administered 2013-05-11 – 2013-05-12 (×2): 1 g via INTRAVENOUS
  Filled 2013-05-11 (×3): qty 10

## 2013-05-11 MED ORDER — ALPRAZOLAM 0.25 MG PO TABS
0.2500 mg | ORAL_TABLET | Freq: Every evening | ORAL | Status: DC | PRN
  Administered 2013-05-12: 0.25 mg via ORAL
  Filled 2013-05-11: qty 1

## 2013-05-11 MED ORDER — ZOLPIDEM TARTRATE 5 MG PO TABS: 5.0000 mg | ORAL_TABLET | Freq: Every evening | ORAL | Status: DC | PRN

## 2013-05-11 MED ORDER — SODIUM CHLORIDE 0.9 % IV BOLUS (SEPSIS)
500.0000 mL | Freq: Once | INTRAVENOUS | Status: AC
  Administered 2013-05-11: 500 mL via INTRAVENOUS

## 2013-05-11 MED ORDER — ONDANSETRON HCL 4 MG/2ML IJ SOLN: 4.0000 mg | Freq: Four times a day (QID) | INTRAMUSCULAR | Status: DC | PRN

## 2013-05-11 MED ORDER — TIOTROPIUM BROMIDE MONOHYDRATE 18 MCG IN CAPS
18.0000 ug | ORAL_CAPSULE | Freq: Every day | RESPIRATORY_TRACT | Status: DC
  Administered 2013-05-12 – 2013-05-13 (×2): 18 ug via RESPIRATORY_TRACT
  Filled 2013-05-11: qty 5

## 2013-05-11 MED ORDER — METOPROLOL SUCCINATE ER 25 MG PO TB24
25.0000 mg | ORAL_TABLET | Freq: Every day | ORAL | Status: DC
  Administered 2013-05-11 – 2013-05-13 (×3): 25 mg via ORAL
  Filled 2013-05-11 (×3): qty 1

## 2013-05-11 MED ORDER — INSULIN GLARGINE 100 UNIT/ML ~~LOC~~ SOLN
42.0000 [IU] | Freq: Every day | SUBCUTANEOUS | Status: DC
  Administered 2013-05-11 – 2013-05-13 (×3): 42 [IU] via SUBCUTANEOUS
  Filled 2013-05-11 (×3): qty 0.42

## 2013-05-11 MED ORDER — ALUM & MAG HYDROXIDE-SIMETH 200-200-20 MG/5ML PO SUSP: 30.0000 mL | Freq: Four times a day (QID) | ORAL | Status: DC | PRN

## 2013-05-11 NOTE — H&P (Signed)
[facility] History and Physical  AZORA BONZO KQA:[id number] DOB: May 12, 1935 DOA: 05/10/2013  Referring physician:  PCP: [name], MD  Specialists:   Chief Complaint: Worsening Dizziness  HPI: [name] is a 78 y.o. female with a history of chronic vertigo who presents to the ED with complaints of worsening of her vertigo during the past day.   She reports feeling lightheaded and almost faint since 4 pm.  She describes her symptoms as being as if she was being shaken all over and the room is spinning.  She took her meclozine but her symptoms did not improve.  She denies having any Fever or Chills or Chest Pain or headache.  Her symptoms were so bad she could not walk and she called EMS.  She was taken to the ED and had a CT scan of her head which was negative for acute findings except chronic sinusitis was seen, and she was referred for medical admission.      Review of Systems: The patient denies anorexia, fever, chills, headaches, weight loss, vision loss, diplopia, decreased hearing, rhinitis, hoarseness, chest pain, syncope, dyspnea on exertion, peripheral edema, cough, hemoptysis, abdominal pain, nausea, vomiting, diarrhea, constipation, hematemesis, melena, hematochezia, severe indigestion/heartburn, dysuria, hematuria, incontinence, muscle weakness, suspicious skin lesions, transient blindness, difficulty walking, depression, unusual weight change, abnormal bleeding, enlarged lymph nodes, angioedema, and breast masses.    Past Medical History  Diagnosis Date  . Irritable bowel syndrome   . Other chronic nonalcoholic liver disease   . Esophageal reflux   . Allergic rhinitis, cause unspecified     Sinus CT Rec 12-23-2009  . 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 Sciatica   . Shingles 2010  . Steatohepatitis   . Diabetes mellitus   . Diverticulosis   . Hiatal hernia   . Esophageal stricture   . Diarrhea   .  GERD (gastroesophageal reflux disease)   . Shortness of breath   . Hypertension   . Arthritis   . Glaucoma   . Complication of anesthesia     takes along time to wake up  . Oxygen deficiency   . Benign paroxysmal positional vertigo 08/09/2012  . Neuropathy in diabetes 08/09/2012    Past Surgical History  Procedure Laterality Date  . Cholecystectomy open  1978  . Liver biopsy  08-1992  . Parathyroid exploration    . Tonsillectomy and adenoidectomy    . Total abdominal hysterectomy    . Esophagogastroduodenoscopy  7943,27-61    H Hernia,es.stricture s/p dil 26F  . Colonoscopy  07-2001    mild diverticulosis  . Carpal tunnel release      right hand  . Cataract extraction      Bilateral  . Ulnar nerve transposition  12/07/2011    Procedure: ULNAR NERVE DECOMPRESSION/TRANSPOSITION;this was cancelled-not done  Surgeon: Cammie Sickle., MD;  Location: [facility];  Service: Orthopedics;  Laterality: Right;  right ulnar nerve in situ decompression  . Ulnar tunnel release  03/07/2012    Procedure: CUBITAL TUNNEL RELEASE;  Surgeon: Roseanne Kaufman, MD;  Location: [place];  Service: Orthopedics;  Laterality: Right;  ulnar nerve release at the elbow      Prior to Admission medications   Medication Sig Start Date End Date Taking? Authorizing Provider  acetaminophen (TYLENOL) 500 MG tablet Take 500 mg by mouth every 6 (six) hours as needed for pain.    Yes Historical Provider,  MD  ALPRAZolam (XANAX) 0.5 MG tablet Take 0.5 tablets (0.25 mg total) by mouth at bedtime as needed for anxiety. 03/05/13  Yes [name], MD  Alum & Mag Hydroxide-Simeth (MAGIC MOUTHWASH) SOLN Take 10 mLs by mouth daily as needed (as needed to help with acid reflux per Dr. Delfin Edis). 04/17/11  Yes Lafayette Dragon, MD  aspirin 325 MG tablet Take 325 mg by mouth daily.     Yes Historical Provider, MD  insulin aspart (NOVOLOG PENFILL) cartridge Inject 1-8 Units into the skin 3 (three) times  daily with meals. Sliding scale   Yes Historical Provider, MD  insulin glargine (LANTUS) 100 UNIT/ML injection Inject 31-42 Units into the skin See admin instructions. Take 42 units in the morning and 31 units at bedtime   Yes Historical Provider, MD  ipratropium (ATROVENT HFA) 17 MCG/ACT inhaler Inhale 2 puffs into the lungs 2 (two) times daily as needed (shortness of breath). 01/21/13 01/21/14 Yes Clinton D Young, MD  ketoconazole (NIZORAL) 2 % cream Apply topically daily. 05/09/13  Yes [name], MD  meclizine (ANTIVERT) 25 MG tablet Take 25 mg by mouth 3 (three) times daily as needed for dizziness.    Yes Historical Provider, MD  metoprolol succinate (TOPROL-XL) 25 MG 24 hr tablet Take 1 tablet (25 mg total) by mouth daily. 04/23/13  Yes [name], MD  omeprazole (PRILOSEC) 20 MG capsule Take 20 mg by mouth daily.   Yes Historical Provider, MD  promethazine-codeine (PHENERGAN WITH CODEINE) 6.25-10 MG/5ML syrup Take 5 mLs by mouth every 6 (six) hours as needed for cough. 04/14/13  Yes Deneise Lever, MD  tiotropium (SPIRIVA HANDIHALER) 18 MCG inhalation capsule Place 1 capsule (18 mcg total) into inhaler and inhale daily. 09/11/12 09/11/13 Yes Clinton D Young, MD  valsartan (DIOVAN) 80 MG tablet Take 1 tablet (80 mg total) by mouth daily. 03/19/13  Yes [name], MD  levalbuterol [facility] HFA) 45 MCG/ACT inhaler Inhale 2 puffs into the lungs every 6 (six) hours as needed for wheezing or shortness of breath. 11/25/12   Deneise Lever, MD    Allergies  Allergen Reactions  . Chlordiazepoxide-Clidinium Other (See Comments)    Sleepy,weak  . Biaxin [Clarithromycin]     Foul taste, abd pain, diarrhea  . Codeine     REACTION: gi upset  . Tramadol     Social History:  reports that she quit smoking about 25 years ago. Her smoking use included Cigarettes. She smoked 0.00 packs per day. She has never used smokeless tobacco. She reports that she does not drink alcohol or use illicit drugs.     Family  History  Problem Relation Age of Onset  . Lung cancer Mother     small cell;Byssinosis  . Heart disease Father   . Lung cancer Sister   . Liver cancer Sister     ? mets from another area of the body  . Diabetes      grandmother  . Stroke Maternal Grandfather     (be sure to complete)   Physical Exam:  GEN:  Pleasant  Elderly Obese  78 y.o. Caucasian female  examined  and in no acute distress; cooperative with exam Filed Vitals:   05/11/13 0145 05/11/13 0215 05/11/13 0250 05/11/13 0400  BP: 103/45 122/46 140/62 120/40  Pulse: 110 110 112 86  Temp:   98 F (36.7 C) 98.7 F (37.1 C)  TempSrc:   Oral Oral  Resp: 14 19 20 18   Height:  5' 3"  (1.6 m)   Weight:   89.585 kg (197 lb 8 oz)   SpO2: 98% 100% 94% 96%   Blood pressure 120/40, pulse 86, temperature 98.7 F (37.1 C), temperature source Oral, resp. rate 18, height 5' 3"  (1.6 m), weight 89.585 kg (197 lb 8 oz), SpO2 96.00%. PSYCH: SHe is alert and oriented x4; does not appear anxious does not appear depressed; affect is normal HEENT: Normocephalic and Atraumatic, Mucous membranes pink; PERRLA; EOM intact; Fundi:  Benign;  No scleral icterus,  Dull Right TM;   Nares: Patent, Oropharynx: Clear, Edentulous, Neck:  FROM, no cervical lymphadenopathy nor thyromegaly or carotid bruit; no JVD; Breasts:: Not examined CHEST WALL: No tenderness CHEST: Normal respiration, clear to auscultation bilaterally HEART: Regular rate and rhythm; no murmurs rubs or gallops BACK: No kyphosis or scoliosis; no CVA tenderness ABDOMEN: Positive Bowel Sounds,  Obese, soft non-tender; no masses, no organomegaly, no pannus; no intertriginous candida. Rectal Exam: Not done EXTREMITIES: No  cyanosis, clubbing or edema; no ulcerations. Genitalia: not examined PULSES: 2+ and symmetric SKIN: Normal hydration no rash or ulceration CNS: Cranial nerves 2-12 grossly intact no focal neurologic deficit    Labs on Admission:  Basic Metabolic Panel:  Recent  Labs Lab 05/10/13 2240  NA 135*  K 4.1  CL 93*  CO2 31  GLUCOSE 155*  BUN 12  CREATININE 0.52  CALCIUM 10.0   Liver Function Tests:  Recent Labs Lab 05/10/13 2240  AST 22  ALT 16  ALKPHOS 52  BILITOT 0.3  PROT 7.7  ALBUMIN 3.9   No results found for this basename: LIPASE, AMYLASE,  in the last 168 hours No results found for this basename: AMMONIA,  in the last 168 hours CBC:  Recent Labs Lab 05/10/13 2240 05/11/13 0555  WBC 10.4 10.3  NEUTROABS 6.9  --   HGB 13.5 12.7  HCT 41.4 39.0  MCV 86.8 87.4  PLT 174 165   Cardiac Enzymes: No results found for this basename: CKTOTAL, CKMB, CKMBINDEX, TROPONINI,  in the last 168 hours  BNP (last 3 results) No results found for this basename: PROBNP,  in the last 8760 hours CBG:  Recent Labs Lab 05/11/13 0059  GLUCAP 157*    Radiological Exams on Admission: Ct Head Wo Contrast  05/11/2013   CLINICAL DATA:  Dizziness.  Unable to stand.  EXAM: CT HEAD WITHOUT CONTRAST  TECHNIQUE: Contiguous axial images were obtained from the base of the skull through the vertex without intravenous contrast.  COMPARISON:  07/19/2012  FINDINGS: There is no evidence of intracranial hemorrhage, brain edema, or other signs of acute infarction. There is no evidence of intracranial mass lesion or mass effect. No abnormal extraaxial fluid collections are identified.  Mild diffuse cerebral atrophy and chronic small vessel disease is stable in appearance. Ventricles are normal in size. No skull abnormality identified. Increased opacification of right ethmoid sinuses noted as well as increased mucosal thickening involving the sphenoid sinus.  IMPRESSION: No acute intracranial findings.  Stable mild cerebral atrophy and chronic small vessel disease.  Increased right ethmoid and sphenoid sinus disease.   Electronically Signed   By: Earle Gell M.D.   On: 05/11/2013 00:31     EKG: Independently reviewed. Sinus Tachycardia at 11 with a Chronic RBBB seen  on previous.      Assessment/Plan Principal Problem:   Vertigo Active Problems:   Tachycardia   UTI (lower urinary tract infection)   Sinusitis   GLAUCOMA   COPD with  chronic bronchitis   Type II or unspecified type diabetes mellitus without mention of complication, uncontrolled   1.   Vertigo-  Admit to Telemetry Bed, Neuro Checks, and MRI/MRA in AM.  Meclozine PRN.  ? CVA versus Labyrinthitis.     2.   Tachycardia-  Monitor on telemetry, check TSH levels and IVFs for Fluid Resuscitation. Etiologies include - Early Sepsis from UTI, or Catecholamine Excess.   May need further workup including catecholamine levels.    3.   UTI-  Urine C+S sent and placed on IV Rocephin.     4.   Sinusitis? ? Otitis media  - incidental finding on CT scan of head.  On IV Rocephin and add PO Azithromycin.     5.   Glaucoma - continue home meds.    6.   COPD -stable .  Continue Home Regimen.    7.   Type II Dm-  Continue   And SSI coverage PRN.  Check HbAic.       Code Status:      FULL CODE Family Communication:    No Family present Disposition Plan:     Observation  Time spent:  Kyle C [facility] Pager [phone]  If 7PM-7AM, please contact night-coverage [email or web address] Password [email or web address] 05/11/2013, 6:59 AM

## 2013-05-11 NOTE — ED Provider Notes (Signed)
Medical screening examination/treatment/procedure(s) were performed by non-physician practitioner and as supervising physician I was immediately available for consultation/collaboration.  EKG Interpretation    Date/Time:  Saturday May 10 2013 22:31:58 EST Ventricular Rate:  111 PR Interval:  162 QRS Duration: 139 QT Interval:  368 QTC Calculation: 500 R Axis:   94 Text Interpretation:  Sinus tachycardia RBBB and LPFB No significant change since last tracing Confirmed by Narissa Beaufort  MD-J, Brynlea Spindler ([id number]) on 05/10/2013 10:51:41 PM             Kathalene Frames, MD 05/11/13 [phone]

## 2013-05-11 NOTE — Progress Notes (Signed)
Utilization Review completed.  

## 2013-05-11 NOTE — ED Notes (Signed)
MD at BS

## 2013-05-11 NOTE — ED Notes (Signed)
Admitting MD at BS.  

## 2013-05-11 NOTE — Progress Notes (Addendum)
Pt seen and examined at bedside. Please see earlier note by Dr. Arnoldo Morale. Pt was admitted with vertigo and ? UTI. She was started on IV Rocephin and with plan to follow up on urine culture and readjust the regimen as indicated. Due to possible sinusitis vs otitis media pt also started on Zithormax. MRI of the brain ordered for further evaluation of vertigo. Carotid doppler and 2D ECHO also part of the work up for vertigo. Blood work including BMP, LFT's, CBC reviewed with no acute abnormalities. Due to tachycardia, TSH was ordered and will need to follow upon results. Will place order for PT evaluation. Minimize use of narcotics and benzo's as this could be contributing to vertigo as well. Continue Meclizine for now.  Addendum - pt declines MRI.  Faye Ramsay, MD  [facility] Pager [phone]  If 7PM-7AM, please contact night-coverage [email or web address] Password [email or web address]

## 2013-05-11 NOTE — Progress Notes (Signed)
Patient on phone with neighbor. After getting off the phone, nurse called to room for patient complaints of "shaking like a tree and getting dizzy all over again." Pt states that her neighbor can't check on her cat and she is a "nervous wreck about getting her cat care" and "the doctors are screwing everything up with her insulin and all her care and I get sicker everytime i come to this stupid place." Pt states that she feels the same way that she did yesterday when she called EMS. VSS. Pt provided with verbal support and able to calm down. Will notify MD on rounds. Dorna Bloom, RN

## 2013-05-12 ENCOUNTER — Other Ambulatory Visit: Payer: Self-pay | Admitting: *Deleted

## 2013-05-12 ENCOUNTER — Telehealth: Payer: Self-pay | Admitting: *Deleted

## 2013-05-12 DIAGNOSIS — IMO0001 Reserved for inherently not codable concepts without codable children: Secondary | ICD-10-CM

## 2013-05-12 DIAGNOSIS — R42 Dizziness and giddiness: Principal | ICD-10-CM

## 2013-05-12 DIAGNOSIS — E1165 Type 2 diabetes mellitus with hyperglycemia: Secondary | ICD-10-CM

## 2013-05-12 DIAGNOSIS — I517 Cardiomegaly: Secondary | ICD-10-CM

## 2013-05-12 LAB — BASIC METABOLIC PANEL
BUN: 11 mg/dL (ref 6–23)
CHLORIDE: 98 meq/L (ref 96–112)
CO2: 30 meq/L (ref 19–32)
Calcium: 9.2 mg/dL (ref 8.4–10.5)
Creatinine, Ser: 0.56 mg/dL (ref 0.50–1.10)
GFR calc Af Amer: >90 mL/min (ref >90)
GFR calc non Af Amer: 89 mL/min — ABNORMAL LOW (ref >90)
GLUCOSE: 246 mg/dL — AB (ref 70–99)
Potassium: 4.7 mEq/L (ref 3.7–5.3)
SODIUM: 139 meq/L (ref 137–147)

## 2013-05-12 LAB — CBC
HCT: 37.9 % (ref 36.0–46.0)
Hemoglobin: 12.4 g/dL (ref 12.0–15.0)
MCH: 28.4 pg (ref 26.0–34.0)
MCHC: 32.7 g/dL (ref 30.0–36.0)
MCV: 86.9 fL (ref 78.0–100.0)
PLATELETS: 155 10*3/uL (ref 150–400)
RBC: 4.36 MIL/uL (ref 3.87–5.11)
RDW: 13.6 % (ref 11.5–15.5)
WBC: 6.8 10*3/uL (ref 4.0–10.5)

## 2013-05-12 LAB — GLUCOSE, CAPILLARY
GLUCOSE-CAPILLARY: 268 mg/dL — AB (ref 70–99)
Glucose-Capillary: 259 mg/dL — ABNORMAL HIGH (ref 70–99)
Glucose-Capillary: 188 mg/dL — ABNORMAL HIGH (ref 70–99)
Glucose-Capillary: 277 mg/dL — ABNORMAL HIGH (ref 70–99)
Glucose-Capillary: 270 mg/dL — ABNORMAL HIGH (ref 70–99)
Glucose-Capillary: 260 mg/dL — ABNORMAL HIGH (ref 70–99)

## 2013-05-12 LAB — LIPID PANEL
CHOL/HDL RATIO: 4.8 ratio
CHOLESTEROL: 178 mg/dL (ref 0–200)
HDL: 37 mg/dL — AB (ref >39)
LDL Cholesterol: 89 mg/dL (ref 0–99)
TRIGLYCERIDES: 261 mg/dL — AB (ref <150)
VLDL: 52 mg/dL — ABNORMAL HIGH (ref 0–40)

## 2013-05-12 MED ORDER — POLYVINYL ALCOHOL 1.4 % OP SOLN
2.0000 [drp] | OPHTHALMIC | Status: DC | PRN
  Administered 2013-05-12: 2 [drp] via OPHTHALMIC
  Filled 2013-05-12: qty 15

## 2013-05-12 MED ORDER — KETOCONAZOLE 2 % EX CREA: TOPICAL_CREAM | Freq: Every day | CUTANEOUS | Status: DC

## 2013-05-12 MED ORDER — INSULIN ASPART 100 UNIT/ML ~~LOC~~ SOLN
8.0000 [IU] | Freq: Three times a day (TID) | SUBCUTANEOUS | Status: DC
  Administered 2013-05-13 (×2): 8 [IU] via SUBCUTANEOUS

## 2013-05-12 MED ORDER — INSULIN ASPART 100 UNIT/ML ~~LOC~~ SOLN
3.0000 [IU] | Freq: Once | SUBCUTANEOUS | Status: AC
  Administered 2013-05-12: 3 [IU] via SUBCUTANEOUS

## 2013-05-12 NOTE — Progress Notes (Signed)
I spoke with Dr. Darrick Meigs while he was on the floor in regards to Dr. Ronnie Derby phone call and recommendations for insulin.

## 2013-05-12 NOTE — Progress Notes (Signed)
Pt woke up c/o being cold, shaky and having a low blood sugar. Pt request blood sugar to be checked. CBG 259. Pt now requesting insulin because she states "my sugars are too high". Pt is upset and states she did not receive any insulin yesterday during the day. Informed pt, insulin was prescribed and documented that it was given. MD paged  And will continue to monitor.

## 2013-05-12 NOTE — Progress Notes (Signed)
Inpatient Diabetes Program Recommendations  AACE/ADA: New Consensus Statement on Inpatient Glycemic Control (2013)  Target Ranges:  Prepandial:   less than 140 mg/dL      Peak postprandial:   less than 180 mg/dL (1-2 hours)      Critically ill patients:  140 - 180 mg/dL     Results for [name], [name] (MRN [id number]) as of 05/12/2013 14:25  Ref. Range 05/11/2013 07:49 05/11/2013 11:40 05/11/2013 17:00 05/11/2013 20:41  Glucose-Capillary Latest Range: 70-99 mg/dL 141 (H) 232 (H) 252 (H) 282 (H)    Results for [name], [name] (MRN [id number]) as of 05/12/2013 14:25  Ref. Range 05/12/2013 07:44 05/12/2013 11:52  Glucose-Capillary Latest Range: 70-99 mg/dL 188 (H) 277 (H)    **Noted patient's PCP (Dr. [name]) called patient's RN today to discuss patient's CBGs and need for more Novolog insulin at meal times.  Per Dr. Ronnie Derby note, patient needs an additional 8-10 units Novolog with meals in addition to her SSI (please see Dr. Ronnie Derby note from today at 10:47 AM)   **MD- Please add Novolog 8 units tid with meals (hold if patient eats <50% of meal)   Will follow. Wyn Quaker RN, MSN, CDE Diabetes Coordinator Inpatient Diabetes Program Team Pager: [phone] (8a-10p)

## 2013-05-12 NOTE — Telephone Encounter (Signed)
Pt called to let you know she's in the hospital, she's concerned because they are not giving her all of her insulin. She said her sugars are going really high.  She said when EMS got to her house, her blood pressure was 260 something.  She wants to know if you can speak to the hospital about her insulin, she's on the west wing, 3rd floor room 20.

## 2013-05-12 NOTE — Progress Notes (Signed)
  Echocardiogram 2D Echocardiogram has been performed.  [name] 05/12/2013, 2:07 PM

## 2013-05-12 NOTE — Discharge Instructions (Signed)
Home Health Services arranged with [place]. [phone]. Registered Nurse, Physical Therapy, Aide and Education officer, museum

## 2013-05-12 NOTE — Evaluation (Signed)
Physical Therapy Evaluation Patient Details Name: [name] MRN: [id number] DOB: 05-08-1936 Today's Date: 05/12/2013 Time: 9977-4142 PT Time Calculation (min): 16 min  PT Assessment / Plan / Recommendation History of Present Illness  Pt is a 78 y.o. female adm due to worsening dizziness. Pt with chronic vertigo, reports increased lightheadedness and dizziness in past year.   Clinical Impression  Pt adm due to the above. Presents with decreased independence wit mobility and is a fall risk secondary to indications listed below. Pt to benefit from skilled acute PT to address deficits listed below and increase independence with mobility in order to return home, where she lives alone. Pt very tearful when speaking about her cat and states she probably needs an assisted living place but will not leave her cat. Pt to benefit from AD for ambulation but is refusing the need for at this time.     PT Assessment  Patient needs continued PT services    Follow Up Recommendations  Home health PT;Supervision/Assistance - 24 hour;Other (comment) (would benefit from ALF but is refusing due to cat)    Does the patient have the potential to tolerate intense rehabilitation      Barriers to Discharge Decreased caregiver support lives alone    Equipment Recommendations  Other (comment) (too be determined SPC vs RW)    Recommendations for Other Services OT consult   Frequency Min 3X/week    Precautions / Restrictions Precautions Precautions: Fall Precaution Comments: pt on 3L O2 at home Restrictions Weight Bearing Restrictions: No   Pertinent Vitals/Pain On 3L O2       Mobility  Bed Mobility Bed Mobility: Not assessed Details for Bed Mobility Assistance: pt sitting EOB and returned to EOB Transfers Transfers: Stand to Sit;Sit to Stand Sit to Stand: From bed;4: Min guard Stand to Sit: 5: Supervision;To bed;To toilet;With upper extremity assist Details for Transfer Assistance: pt reaching  for UE support when rising to standing position; requies supervision for safety and cues for safety with controlling descent  Ambulation/Gait Ambulation/Gait Assistance: 5: Supervision Ambulation Distance (Feet): 15 Feet (x2) Assistive device: None Ambulation/Gait Assistance Details: pt reaching for external support to brace herself throughout room; pt becomes agitated with min guard (A) but is unsteady with gt; may benefit from AD prior to returning home but does not believe she needs one Gait Pattern: Step-through pattern;Ataxic;Wide base of support;Trunk flexed Gait velocity: decreased; guarded Stairs: No Wheelchair Mobility Wheelchair Mobility: No         PT Diagnosis: Difficulty walking  PT Problem List: Decreased activity tolerance;Decreased balance;Decreased mobility;Decreased safety awareness;Cardiopulmonary status limiting activity;Impaired sensation PT Treatment Interventions: DME instruction;Gait training;Functional mobility training;Therapeutic activities;Balance training;Therapeutic exercise;Neuromuscular re-education;Patient/family education     PT Goals(Current goals can be found in the care plan section) Acute Rehab PT Goals Patient Stated Goal: for you guys to figure out what is going on PT Goal Formulation: With patient Time For Goal Achievement: 05/26/13 Potential to Achieve Goals: Good  Visit Information  Last PT Received On: 05/12/13 Assistance Needed: +1 History of Present Illness: Pt is a 78 y.o. female adm due to        Prior [name] expects to be discharged to:: Private residence Living Arrangements: Alone Available Help at Discharge: Friend(s);Available PRN/intermittently Type of Home: Apartment Home Access: Level entry Home Layout: One level Home Equipment: Cane - single point Prior Function Level of Independence: Independent Comments: pt reports she was independent and driving till recently; she is too scared to  drive now due to her unexplained dizziness  Communication Communication: No difficulties    Cognition  Cognition Arousal/Alertness: Awake/alert Behavior During Therapy: Anxious Overall Cognitive Status: Within Functional Limits for tasks assessed    Extremity/Trunk Assessment Upper Extremity Assessment Upper Extremity Assessment: Defer to OT evaluation Lower Extremity Assessment Lower Extremity Assessment: Overall WFL for tasks assessed (with with neuropathy) Cervical / Trunk Assessment Cervical / Trunk Assessment: Kyphotic   Balance Balance Balance Assessed: Yes Static Sitting Balance Static Sitting - Balance Support: No upper extremity supported;Feet unsupported Static Sitting - Level of Assistance: 7: Independent  End of Session PT - End of Session Equipment Utilized During Treatment: Gait belt;Oxygen Activity Tolerance: Patient limited by fatigue;Other (comment) (pt very anxious during session; states that multiple times ) Patient left: in bed;with call bell/phone within reach Nurse Communication: Mobility status  [name], [name],  [name] 05/12/2013, 9:15 AM

## 2013-05-12 NOTE — Telephone Encounter (Signed)
Discussed with her inpatient nurse, patient will need to be taking mealtime coverage of 8-10 units of NovoLog in addition to current regimen

## 2013-05-12 NOTE — Progress Notes (Signed)
VASCULAR LAB PRELIMINARY  PRELIMINARY  PRELIMINARY  PRELIMINARY  Carotid duplex  completed.    Preliminary report:  No significant ICA stenosis noted bilaterally.  Mild to moderate calcific and non calcific plaque noted bilaterally.  Vertebral artery flow antegrade.    Venecia Mehl, RVT 05/12/2013, 1:54 PM

## 2013-05-12 NOTE — Care Management Note (Signed)
    Page 1 of 2   05/12/2013     11:47:46 AM   CARE MANAGEMENT NOTE 05/12/2013  Patient:  [name], [name]   Account Number:  [id number]  Date Initiated:  05/12/2013  Documentation initiated by:  GRAVES-BIGELOW,Dymin [place]  Subjective/Objective Assessment:   Pt admitted for Worsening Dizziness. Pt is from home alone and has a cat. Pt states she lives in an apartment via [address].     Action/Plan:   CM will make referral for [facility], PT, SW and aide via [facility]. SOC to begin within 24-48 hours post d/c. Pt states she has mediciad tx. Ptl needs HH SW to assist with SS/ assistance with medicaid resources. E tank to be delivered to rm before d/c   Anticipated DC Date:  05/13/2013   Anticipated DC Plan:  [place]  CM consult      [place]   Choice offered to / List presented to:  C-1 Patient        [place] arranged  HH-1 RN  [place]      [place].   Status of service:  Completed, signed off Medicare Important Message given?   (If response is "NO", the following Medicare IM given date fields will be blank) Date Medicare IM given:   Date Additional Medicare IM given:    Discharge Disposition:  [place]  Per UR Regulation:  Reviewed for med. necessity/level of care/duration of stay  If discussed at [place] of Stay Meetings, dates discussed:    Comments:

## 2013-05-12 NOTE — Progress Notes (Addendum)
[facility] PROGRESS NOTE  [name] NFA:[id number] DOB: 1936-04-04 DOA: 05/10/2013 PCP: [name], MD  Assessment/Plan: 1. Dizziness- patient had worsening of dizziness on right side, on the [facility] maneuver possible BPPV. We'll continue with meclizine. We'll obtain vestibular PT. patient did not want MRI of the brain 2. Diabetes mellitus- patient will be continued on Lantus, will add NovoLog 8 units 3 times a day with meals. 3. UTI- urine culture growing negative rods, continue with Rocephin. 4. COPD- stable 5. Glaucoma- stable continue home medications.  Code Status: Full code Family Communication: Discussed with patient in detail Disposition Plan: *Home in stable   Consultants:  None  Procedures:  None  Antibiotics:  None  HPI/Subjective: Patient seen and examined, complains of dizziness which has been going on for over a year after patient surgery in the right arm.  Objective: Filed Vitals:   05/12/13 1400  BP: 122/46  Pulse: 92  Temp: 98.3 F (36.8 C)  Resp: 18   No intake or output data in the 24 hours ending 05/12/13 1948 Filed Weights   05/11/13 0250 05/12/13 0500  Weight: 89.585 kg (197 lb 8 oz) 89.858 kg (198 lb 1.6 oz)    Exam:  Physical Exam: Head: Normocephalic, atraumatic.  Eyes: No signs of jaundice, EOMI Nose: Mucous membranes dry.  Throat: Oropharynx nonerythematous, no exudate appreciated.  Neck: supple,No deformities, masses, or tenderness noted. Lungs: Normal respiratory effort. B/L Clear to auscultation, no crackles or wheezes.  Heart: Regular RR. S1 and S2 normal  Abdomen: BS normoactive. Soft, Nondistended, non-tender.  Extremities: No pretibial edema, no erythema   Data Reviewed: Basic Metabolic Panel:  Recent Labs Lab 05/10/13 2240 05/11/13 0555 05/12/13 0435  NA 135* 142 139  K 4.1 3.9 4.7  CL 93* 100 98  CO2 31 33* 30  GLUCOSE 155* 195* 246*  BUN 12 11 11   CREATININE 0.52 0.54 0.56  CALCIUM 10.0  9.4 9.2   Liver Function Tests:  Recent Labs Lab 05/10/13 2240  AST 22  ALT 16  ALKPHOS 52  BILITOT 0.3  PROT 7.7  ALBUMIN 3.9   No results found for this basename: LIPASE, AMYLASE,  in the last 168 hours No results found for this basename: AMMONIA,  in the last 168 hours CBC:  Recent Labs Lab 05/10/13 2240 05/11/13 0555 05/12/13 0435  WBC 10.4 10.3 6.8  NEUTROABS 6.9  --   --   HGB 13.5 12.7 12.4  HCT 41.4 39.0 37.9  MCV 86.8 87.4 86.9  PLT 174 165 155   Cardiac Enzymes: No results found for this basename: CKTOTAL, CKMB, CKMBINDEX, TROPONINI,  in the last 168 hours BNP (last 3 results) No results found for this basename: PROBNP,  in the last 8760 hours CBG:  Recent Labs Lab 05/12/13 0258 05/12/13 0744 05/12/13 1152 05/12/13 1518 05/12/13 1647  GLUCAP 259* 188* 277* 268* 270*    Recent Results (from the past 240 hour(s))  URINE CULTURE     Status: None   Collection Time    05/10/13 10:36 PM      Result Value Range Status   Specimen Description URINE, CLEAN CATCH   Final   Special Requests NONE   Final   Culture  Setup Time     Final   Value: 05/11/2013 15:36     Performed at [place]     Final   Value: >=100,000 COLONIES/ML     Performed at [facility]  Culture     Final   Value: GRAM NEGATIVE RODS     Performed at [facility]   Report Status PENDING   Incomplete     Studies: Ct Head Wo Contrast  05/11/2013   CLINICAL DATA:  Dizziness.  Unable to stand.  EXAM: CT HEAD WITHOUT CONTRAST  TECHNIQUE: Contiguous axial images were obtained from the base of the skull through the vertex without intravenous contrast.  COMPARISON:  07/19/2012  FINDINGS: There is no evidence of intracranial hemorrhage, brain edema, or other signs of acute infarction. There is no evidence of intracranial mass lesion or mass effect. No abnormal extraaxial fluid collections are identified.  Mild diffuse cerebral atrophy and chronic  small vessel disease is stable in appearance. Ventricles are normal in size. No skull abnormality identified. Increased opacification of right ethmoid sinuses noted as well as increased mucosal thickening involving the sphenoid sinus.  IMPRESSION: No acute intracranial findings.  Stable mild cerebral atrophy and chronic small vessel disease.  Increased right ethmoid and sphenoid sinus disease.   Electronically Signed   By: Earle Gell M.D.   On: 05/11/2013 00:31    Scheduled Meds: . aspirin  325 mg Oral Daily  . cefTRIAXone (ROCEPHIN)  IV  1 g Intravenous Q24H  . enoxaparin (LOVENOX) injection  40 mg Subcutaneous Q24H  . insulin aspart  0-9 Units Subcutaneous TID WC  . [START ON 05/13/2013] insulin aspart  8 Units Subcutaneous TID WC  . insulin glargine  31 Units Subcutaneous QHS  . insulin glargine  42 Units Subcutaneous Daily  . irbesartan  75 mg Oral Daily  . metoprolol succinate  25 mg Oral Daily  . pantoprazole  40 mg Oral Daily  . sodium chloride  3 mL Intravenous Q12H  . tiotropium  18 mcg Inhalation Daily   Continuous Infusions: . sodium chloride 75 mL/hr at 05/11/13 0324    Principal Problem:   Vertigo Active Problems:   GLAUCOMA   COPD with chronic bronchitis   Type II or unspecified type diabetes mellitus without mention of complication, uncontrolled   Dizziness   Tachycardia   UTI (lower urinary tract infection)   Sinusitis    Time spent: 25 minutes   [facility] Pager [phone] If 7PM-7AM, please contact night-coverage at [email or web address], password [facility] 05/12/2013, 7:48 PM  LOS: 2 days

## 2013-05-13 DIAGNOSIS — R42 Dizziness and giddiness: Secondary | ICD-10-CM | POA: Diagnosis not present

## 2013-05-13 DIAGNOSIS — J449 Chronic obstructive pulmonary disease, unspecified: Secondary | ICD-10-CM

## 2013-05-13 DIAGNOSIS — N390 Urinary tract infection, site not specified: Secondary | ICD-10-CM

## 2013-05-13 LAB — URINE CULTURE: Colony Count: >=100000

## 2013-05-13 LAB — GLUCOSE, CAPILLARY
GLUCOSE-CAPILLARY: 222 mg/dL — AB (ref 70–99)
Glucose-Capillary: 259 mg/dL — ABNORMAL HIGH (ref 70–99)

## 2013-05-13 MED ORDER — CIPROFLOXACIN HCL 500 MG PO TABS
500.0000 mg | ORAL_TABLET | Freq: Two times a day (BID) | ORAL | Status: DC
  Administered 2013-05-13: 500 mg via ORAL
  Filled 2013-05-13 (×3): qty 1

## 2013-05-13 MED ORDER — CIPROFLOXACIN HCL 500 MG PO TABS: 500.0000 mg | ORAL_TABLET | Freq: Two times a day (BID) | ORAL | Status: DC

## 2013-05-13 NOTE — Progress Notes (Signed)
Pt provided with dc instructions and educatino. Pt verbalized understanding. All questions answered. Pt leaving via cab for home. All HH has beens et up and discussed with patient. IV removed with tip intact. Heart monitor cleaned and returned to frotn. Dorna Bloom, Rn

## 2013-05-13 NOTE — Discharge Summary (Signed)
Physician Discharge Summary  [name] [id number] DOB: 1935-11-06 DOA: 05/10/2013  PCP: [name], MD  Admit date: 05/10/2013 Discharge date: 05/13/2013  Time spent: 50* minutes  Recommendations for Outpatient Follow-up:  1. Follow up PCP in 2 weeks  Discharge Diagnoses:  Principal Problem:   Vertigo Active Problems:   GLAUCOMA   COPD with chronic bronchitis   Type II or unspecified type diabetes mellitus without mention of complication, uncontrolled   Dizziness   Tachycardia   UTI (lower urinary tract infection)   Sinusitis   Discharge Condition: Stable  Diet recommendation: Low salt diet  Filed Weights   05/11/13 0250 05/12/13 0500  Weight: 89.585 kg (197 lb 8 oz) 89.858 kg (198 lb 1.6 oz)    History of present illness:  78 y.o. female with a history of chronic vertigo who presents to the ED with complaints of worsening of her vertigo during the past day. She reports feeling lightheaded and almost faint since 4 pm. She describes her symptoms as being as if she was being shaken all over and the room is spinning. She took her meclozine but her symptoms did not improve. She denies having any Fever or Chills or Chest Pain or headache. Her symptoms were so bad she could not walk and she called EMS. She was taken to the ED and had a CT scan of her head which was negative for acute findings except chronic sinusitis was seen, and she was referred for medical admission   Hospital Course:  Dizziness- patient had worsening of dizziness on right side, on the [facility] maneuver possible BPPV. We'll continue with meclizine. Vestibular PT was obtained, she will go home with HHPT patient did not want MRI of the brain  Diabetes mellitus- patient will be continued on Lantus, and Novolog meal coverage with 8 units tid UTI- urine culture growing  Enterobacter resistant to  Rocephin, will start her on Cipro for total seven days of treatment. Marland Kitchen COPD- stable  Glaucoma- stable continue  home medications   Procedures:  None  Consultations:  None  Discharge Exam: Filed Vitals:   05/13/13 1250  BP: 130/51  Pulse: 89  Temp:   Resp: 18    General: Appear in no acute distress Cardiovascular: *s1s2 RRR Respiratory: *Clear bilaterally  Discharge Instructions  Discharge Orders   Future Appointments Provider Department Dept Phone   05/19/2013 10:45 AM [name], MD [facility] [phone]   07/21/2013 9:15 AM Deneise Lever, MD [facility] [phone]   Future Orders Complete By Expires   Diet - low sodium heart healthy  As directed    Discharge instructions  As directed    Comments:     Home health PT for vestibular PT   Increase activity slowly  As directed        Medication List         acetaminophen 500 MG tablet  Commonly known as:  TYLENOL  Take 500 mg by mouth every 6 (six) hours as needed for pain.     ALPRAZolam 0.5 MG tablet  Commonly known as:  XANAX  Take 0.5 tablets (0.25 mg total) by mouth at bedtime as needed for anxiety.     aspirin 325 MG tablet  Take 325 mg by mouth daily.     ciprofloxacin 500 MG tablet  Commonly known as:  CIPRO  Take 1 tablet (500 mg total) by mouth 2 (two) times daily.     insulin glargine 100 UNIT/ML injection  Commonly known as:  LANTUS  Inject 31-42 Units into the skin See admin instructions. Take 42 units in the morning and 31 units at bedtime     ipratropium 17 MCG/ACT inhaler  Commonly known as:  ATROVENT HFA  Inhale 2 puffs into the lungs 2 (two) times daily as needed (shortness of breath).     ketoconazole 2 % cream  Commonly known as:  NIZORAL  Apply topically daily.     levalbuterol 45 MCG/ACT inhaler  Commonly known as:  XOPENEX HFA  Inhale 2 puffs into the lungs every 6 (six) hours as needed for wheezing or shortness of breath.     magic mouthwash Soln  Take 10 mLs by mouth daily as needed (as needed to help with acid reflux per Dr. Delfin Edis).      meclizine 25 MG tablet  Commonly known as:  ANTIVERT  Take 25 mg by mouth 3 (three) times daily as needed for dizziness.     metoprolol succinate 25 MG 24 hr tablet  Commonly known as:  TOPROL-XL  Take 1 tablet (25 mg total) by mouth daily.     NOVOLOG PENFILL cartridge  Generic drug:  insulin aspart  Inject 1-8 Units into the skin 3 (three) times daily with meals. Sliding scale     omeprazole 20 MG capsule  Commonly known as:  PRILOSEC  Take 20 mg by mouth daily.     promethazine-codeine 6.25-10 MG/5ML syrup  Commonly known as:  PHENERGAN with CODEINE  Take 5 mLs by mouth every 6 (six) hours as needed for cough.     tiotropium 18 MCG inhalation capsule  Commonly known as:  SPIRIVA HANDIHALER  Place 1 capsule (18 mcg total) into inhaler and inhale daily.     valsartan 80 MG tablet  Commonly known as:  DIOVAN  Take 1 tablet (80 mg total) by mouth daily.       Allergies  Allergen Reactions  . Chlordiazepoxide-Clidinium Other (See Comments)    Sleepy,weak  . Biaxin [Clarithromycin]     Foul taste, abd pain, diarrhea  . Codeine     REACTION: gi upset  . Tramadol       The results of significant diagnostics from this hospitalization (including imaging, microbiology, ancillary and laboratory) are listed below for reference.    Significant Diagnostic Studies: Ct Head Wo Contrast  05/11/2013   CLINICAL DATA:  Dizziness.  Unable to stand.  EXAM: CT HEAD WITHOUT CONTRAST  TECHNIQUE: Contiguous axial images were obtained from the base of the skull through the vertex without intravenous contrast.  COMPARISON:  07/19/2012  FINDINGS: There is no evidence of intracranial hemorrhage, brain edema, or other signs of acute infarction. There is no evidence of intracranial mass lesion or mass effect. No abnormal extraaxial fluid collections are identified.  Mild diffuse cerebral atrophy and chronic small vessel disease is stable in appearance. Ventricles are normal in size. No skull  abnormality identified. Increased opacification of right ethmoid sinuses noted as well as increased mucosal thickening involving the sphenoid sinus.  IMPRESSION: No acute intracranial findings.  Stable mild cerebral atrophy and chronic small vessel disease.  Increased right ethmoid and sphenoid sinus disease.   Electronically Signed   By: Earle Gell M.D.   On: 05/11/2013 00:31    Microbiology: Recent Results (from the past 240 hour(s))  URINE CULTURE     Status: None   Collection Time    05/10/13 10:36 PM      Result Value Range Status  Specimen Description URINE, CLEAN CATCH   Final   Special Requests NONE   Final   Culture  Setup Time     Final   Value: 05/11/2013 15:36     Performed at [place]     Final   Value: >=100,000 COLONIES/ML     Performed at [facility]   Culture     Final   Value: ENTEROBACTER AEROGENES     Performed at [facility]   Report Status 05/13/2013 FINAL   Final   Organism ID, Bacteria ENTEROBACTER AEROGENES   Final     Labs: Basic Metabolic Panel:  Recent Labs Lab 05/10/13 2240 05/11/13 0555 05/12/13 0435  NA 135* 142 139  K 4.1 3.9 4.7  CL 93* 100 98  CO2 31 33* 30  GLUCOSE 155* 195* 246*  BUN 12 11 11   CREATININE 0.52 0.54 0.56  CALCIUM 10.0 9.4 9.2   Liver Function Tests:  Recent Labs Lab 05/10/13 2240  AST 22  ALT 16  ALKPHOS 52  BILITOT 0.3  PROT 7.7  ALBUMIN 3.9   No results found for this basename: LIPASE, AMYLASE,  in the last 168 hours No results found for this basename: AMMONIA,  in the last 168 hours CBC:  Recent Labs Lab 05/10/13 2240 05/11/13 0555 05/12/13 0435  WBC 10.4 10.3 6.8  NEUTROABS 6.9  --   --   HGB 13.5 12.7 12.4  HCT 41.4 39.0 37.9  MCV 86.8 87.4 86.9  PLT 174 165 155   Cardiac Enzymes: No results found for this basename: CKTOTAL, CKMB, CKMBINDEX, TROPONINI,  in the last 168 hours BNP: BNP (last 3 results) No results found for this basename: PROBNP,   in the last 8760 hours CBG:  Recent Labs Lab 05/12/13 1518 05/12/13 1647 05/12/13 2037 05/13/13 0730 05/13/13 1137  GLUCAP 268* 270* 260* 222* 259*       Signed:  LAMA,GAGAN S  [facility] 05/13/2013, 2:57 PM

## 2013-05-13 NOTE — Progress Notes (Signed)
ANTIBIOTIC CONSULT NOTE - INITIAL  Pharmacy Consult for Cipro Indication: UTI  Allergies  Allergen Reactions  . Chlordiazepoxide-Clidinium Other (See Comments)    Sleepy,weak  . Biaxin [Clarithromycin]     Foul taste, abd pain, diarrhea  . Codeine     REACTION: gi upset  . Tramadol     Patient Measurements: Height: 5' 3"  (160 cm) Weight: 198 lb 1.6 oz (89.858 kg) IBW/kg (Calculated) : 52.4  Vital Signs: Temp: 98.5 F (36.9 C) (01/06 0800) Temp src: Oral (01/06 0800) BP: 130/51 mmHg (01/06 1250) Pulse Rate: 89 (01/06 1250) Intake/Output from previous day:   Intake/Output from this shift: Total I/O In: 640 [P.O.:640] Out: 300 [Urine:300]  Labs:  Recent Labs  05/10/13 2240 05/11/13 0555 05/12/13 0435  WBC 10.4 10.3 6.8  HGB 13.5 12.7 12.4  PLT 174 165 155  CREATININE 0.52 0.54 0.56   Estimated Creatinine Clearance: 64.7 ml/min (by C-G formula based on Cr of 0.56). No results found for this basename: Letta Median, VANCORANDOM, GENTTROUGH, GENTPEAK, GENTRANDOM, TOBRATROUGH, TOBRAPEAK, TOBRARND, AMIKACINPEAK, AMIKACINTROU, AMIKACIN,  in the last 72 hours   Microbiology: Recent Results (from the past 720 hour(s))  URINE CULTURE     Status: None   Collection Time    05/10/13 10:36 PM      Result Value Range Status   Specimen Description URINE, CLEAN CATCH   Final   Special Requests NONE   Final   Culture  Setup Time     Final   Value: 05/11/2013 15:36     Performed at [place]     Final   Value: >=100,000 COLONIES/ML     Performed at [facility]   Culture     Final   Value: ENTEROBACTER AEROGENES     Performed at [facility]   Report Status 05/13/2013 FINAL   Final   Organism ID, Bacteria ENTEROBACTER AEROGENES   Final    Medical History: Past Medical History  Diagnosis Date  . Irritable bowel syndrome   . Other chronic nonalcoholic liver disease   . Esophageal reflux   . Allergic rhinitis,  cause unspecified     Sinus CT Rec 12-23-2009  . 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 Sciatica   . Shingles 2010  . Steatohepatitis   . Diabetes mellitus   . Diverticulosis   . Hiatal hernia   . Esophageal stricture   . Diarrhea   . GERD (gastroesophageal reflux disease)   . Shortness of breath   . Hypertension   . Arthritis   . Glaucoma   . Complication of anesthesia     takes along time to wake up  . Oxygen deficiency   . Benign paroxysmal positional vertigo 08/09/2012  . Neuropathy in diabetes 08/09/2012    Assessment: 78 y/o female to begin Cipro for an enterobacter UTI. Patient had 2 days of ceftriaxone previously but the organism was resistant to this medication. She is afebrile and WBC are normal. SCr is normal.  Goal of Therapy:  Resolution of infection  Plan:  -Cipro 500 mg PO bid for 7 days (verbal agreement with Dr. Darrick Meigs) -Pharmacy signing off  [facility], [place].D., BCPS Clinical Pharmacist Pager: [phone] 05/13/2013 1:15 PM

## 2013-05-13 NOTE — Progress Notes (Signed)
CSW (Clinical Education officer, museum) informed by pt nurse that pt has no transportation home. CSW confirmed that pt is unable to get in contact with family or friends and is unable to take the bus. CSW spoke with RN CM who arranged for [employer] to bring pt O2 tank to use to get home. CSW provided pt nurse with cab voucher for pt along with phone number for cab when pt is ready. CSW signing off.  [place], [place]

## 2013-05-13 NOTE — Progress Notes (Addendum)
Physical Therapy Treatment Patient Details Name: [name] MRN: [id number] DOB: Jan 18, 1936 Today's Date: 05/13/2013 Time: 0935-1020 PT Time Calculation (min): 45 min  PT Assessment / Plan / Recommendation  History of Present Illness Pt is a 78 y.o. female adm due to vertigo.   PT Comments   Pt admitted with above. Pt currently with functional limitations due to vestibular deficits as well as endurance and balance issues.  Pt will need f/u vestibular therapy at d/c as well as PT for neck tightness.  Feel that both issues need to be addressed for patient's maximal return to function.  Pt will benefit from skilled PT to increase their independence and safety with mobility to allow discharge to the venue listed below.  Follow Up Recommendations  Home health PT;Supervision/Assistance - 24 hour;Other (comment) (vestibular rehab)                 Equipment Recommendations  None recommended by PT    Recommendations for Other Services OT consult  Frequency Min 3X/week  Progress towards PT Goals Progress towards PT goals: Progressing toward goals  Plan Current plan remains appropriate    Precautions / Restrictions Precautions Precautions: Fall Precaution Comments: pt on 3L O2 at home Restrictions Weight Bearing Restrictions: No   Pertinent Vitals/Pain VSS,some neck pain per pt    Mobility  Bed Mobility Bed Mobility: Rolling Right;Rolling Left;Right Sidelying to Sit;Left Sidelying to Sit;Sitting - Scoot to [employer] of Bed Rolling Right: 4: Min guard Rolling Left: 4: Min guard Right Sidelying to Sit: 4: Min assist Left Sidelying to Sit: 4: Min assist Sitting - Scoot to Edge of Bed: 4: Min assist Details for Bed Mobility Assistance: pt sitting EOB and returned to EOB.  Vestibular testing was provided.  Negative for left BPPV but positive for right BPPV.  Treated with Epley maneuver for right BPPV.  Gave pt Nestor Lewandowsky exercise and educated in performance of exercise.    Transfers Transfers: Stand to Sit;Sit to Stand Sit to Stand: From bed;4: Min guard Stand to Sit: 5: Supervision;To bed;To toilet;With upper extremity assist Details for Transfer Assistance: pt reaching for UE support when rising to standing position; requies supervision for safety and cues for safety with controlling descent  Ambulation/Gait Ambulation/Gait Assistance: 5: Supervision Ambulation Distance (Feet): 20 Feet Assistive device: None Ambulation/Gait Assistance Details: Pt continues to reach for external support to brace herself throughout room.  Pt agreed to use cane but refused RW as her apartment is small.   Gait Pattern: Step-through pattern;Ataxic;Wide base of support;Trunk flexed Gait velocity: decreased; guarded Stairs: No Wheelchair Mobility Wheelchair Mobility: No    Exercises Other Exercises Other Exercises: Nestor Lewandowsky     PT Goals (current goals can now be found in the care plan section)    Visit Information  Last PT Received On: 05/13/13 Assistance Needed: +1 History of Present Illness: Pt is a 78 y.o. female adm due to vertigo.    Subjective Data  Subjective: "I just cant believe this."   Cognition  Cognition Arousal/Alertness: Awake/alert Behavior During Therapy: Anxious Overall Cognitive Status: Within Functional Limits for tasks assessed    Balance  Balance Balance Assessed: Yes Static Sitting Balance Static Sitting - Balance Support: No upper extremity supported;Feet unsupported Static Sitting - Level of Assistance: 7: Independent  End of Session PT - End of Session Equipment Utilized During Treatment: Gait belt;Oxygen Activity Tolerance: Patient limited by fatigue;Other (comment) Patient left: in bed;with call bell/phone within reach Nurse Communication: Mobility status  INGOLD,Shavone Nevers 05/13/2013, 10:42 AM [facility] [phone] (pager)

## 2013-05-15 DIAGNOSIS — N390 Urinary tract infection, site not specified: Secondary | ICD-10-CM | POA: Diagnosis not present

## 2013-05-15 DIAGNOSIS — H6690 Otitis media, unspecified, unspecified ear: Secondary | ICD-10-CM | POA: Diagnosis not present

## 2013-05-15 DIAGNOSIS — Z9981 Dependence on supplemental oxygen: Secondary | ICD-10-CM | POA: Diagnosis not present

## 2013-05-15 DIAGNOSIS — IMO0001 Reserved for inherently not codable concepts without codable children: Secondary | ICD-10-CM | POA: Diagnosis not present

## 2013-05-15 DIAGNOSIS — J441 Chronic obstructive pulmonary disease with (acute) exacerbation: Secondary | ICD-10-CM | POA: Diagnosis not present

## 2013-05-15 DIAGNOSIS — R42 Dizziness and giddiness: Secondary | ICD-10-CM | POA: Diagnosis not present

## 2013-05-15 DIAGNOSIS — Z794 Long term (current) use of insulin: Secondary | ICD-10-CM | POA: Diagnosis not present

## 2013-05-15 DIAGNOSIS — Z602 Problems related to living alone: Secondary | ICD-10-CM | POA: Diagnosis not present

## 2013-05-16 ENCOUNTER — Telehealth: Payer: Self-pay | Admitting: Internal Medicine

## 2013-05-16 MED ORDER — AZITHROMYCIN 250 MG PO TABS: ORAL_TABLET | ORAL | Status: DC

## 2013-05-16 NOTE — Telephone Encounter (Signed)
Per CY, z-pack was sent in to [facility].  Pt aware, advised her to use rescue inhaler as needed and to call if symptoms worsen or don't improve.  Nothing further needed at this time.

## 2013-05-16 NOTE — Telephone Encounter (Signed)
Pt c/o prod cough with green mucous X6 days, dizziness, sinus congestion, runny nose with green mucous, chest congestion, fatigue.  Pt denies fever.  Pt has been taking cipro for yeast infection bid, last pill is today.  Pt has been taking spiriva, has not been using rescue inhaler.  Pt requesting z-pack.  Dr Annamaria Boots please advise.    FYI: Pt was seen @ [facility] on 05/10/13 for vertigo.    Last OV 01/21/13 Next ov 07/21/13  [facility] [address]  Allergies verified Allergies  Allergen Reactions  . Chlordiazepoxide-Clidinium Other (See Comments)    Sleepy,weak  . Biaxin [Clarithromycin]     Foul taste, abd pain, diarrhea  . Codeine     REACTION: gi upset  . Tramadol     Current meds Current Outpatient Prescriptions on File Prior to Visit  Medication Sig Dispense Refill  . acetaminophen (TYLENOL) 500 MG tablet Take 500 mg by mouth every 6 (six) hours as needed for pain.       Marland Kitchen ALPRAZolam (XANAX) 0.5 MG tablet Take 0.5 tablets (0.25 mg total) by mouth at bedtime as needed for anxiety.  30 tablet  5  . Alum & Mag Hydroxide-Simeth (MAGIC MOUTHWASH) SOLN Take 10 mLs by mouth daily as needed (as needed to help with acid reflux per Dr. Delfin Edis).      Marland Kitchen aspirin 325 MG tablet Take 325 mg by mouth daily.        . ciprofloxacin (CIPRO) 500 MG tablet Take 1 tablet (500 mg total) by mouth 2 (two) times daily.  14 tablet  0  . insulin aspart (NOVOLOG PENFILL) cartridge Inject 1-8 Units into the skin 3 (three) times daily with meals. Sliding scale      . insulin glargine (LANTUS) 100 UNIT/ML injection Inject 31-42 Units into the skin See admin instructions. Take 42 units in the morning and 31 units at bedtime      . ipratropium (ATROVENT HFA) 17 MCG/ACT inhaler Inhale 2 puffs into the lungs 2 (two) times daily as needed (shortness of breath).  1 Inhaler  11  . ketoconazole (NIZORAL) 2 % cream Apply topically daily.  60 g  1  . levalbuterol (XOPENEX HFA) 45 MCG/ACT inhaler Inhale 2 puffs into the lungs  every 6 (six) hours as needed for wheezing or shortness of breath.  1 Inhaler  11  . meclizine (ANTIVERT) 25 MG tablet Take 25 mg by mouth 3 (three) times daily as needed for dizziness.       . metoprolol succinate (TOPROL-XL) 25 MG 24 hr tablet Take 1 tablet (25 mg total) by mouth daily.  30 tablet  5  . omeprazole (PRILOSEC) 20 MG capsule Take 20 mg by mouth daily.      . promethazine-codeine (PHENERGAN WITH CODEINE) 6.25-10 MG/5ML syrup Take 5 mLs by mouth every 6 (six) hours as needed for cough.  120 mL  0  . tiotropium (SPIRIVA HANDIHALER) 18 MCG inhalation capsule Place 1 capsule (18 mcg total) into inhaler and inhale daily.  30 capsule  prn  . valsartan (DIOVAN) 80 MG tablet Take 1 tablet (80 mg total) by mouth daily.  30 tablet  5   No current facility-administered medications on file prior to visit.

## 2013-05-16 NOTE — Telephone Encounter (Signed)
Per CY-okay to give Zpak #1 take as directed no refills and okay to use her rescue inhaler as needed. Thanks.

## 2013-05-19 ENCOUNTER — Other Ambulatory Visit (INDEPENDENT_AMBULATORY_CARE_PROVIDER_SITE_OTHER): Payer: Medicare Other

## 2013-05-19 ENCOUNTER — Ambulatory Visit (INDEPENDENT_AMBULATORY_CARE_PROVIDER_SITE_OTHER): Payer: Medicare Other | Admitting: Endocrinology

## 2013-05-19 ENCOUNTER — Encounter: Payer: Self-pay | Admitting: Endocrinology

## 2013-05-19 VITALS — BP 134/82 | HR 96 | Temp 98.5°F | Resp 12 | Ht 63.0 in | Wt 198.3 lb

## 2013-05-19 DIAGNOSIS — H8110 Benign paroxysmal vertigo, unspecified ear: Secondary | ICD-10-CM

## 2013-05-19 DIAGNOSIS — E785 Hyperlipidemia, unspecified: Secondary | ICD-10-CM

## 2013-05-19 DIAGNOSIS — E1165 Type 2 diabetes mellitus with hyperglycemia: Secondary | ICD-10-CM

## 2013-05-19 DIAGNOSIS — IMO0001 Reserved for inherently not codable concepts without codable children: Secondary | ICD-10-CM

## 2013-05-19 DIAGNOSIS — E1149 Type 2 diabetes mellitus with other diabetic neurological complication: Secondary | ICD-10-CM

## 2013-05-19 DIAGNOSIS — L659 Nonscarring hair loss, unspecified: Secondary | ICD-10-CM

## 2013-05-19 DIAGNOSIS — N390 Urinary tract infection, site not specified: Secondary | ICD-10-CM

## 2013-05-19 DIAGNOSIS — I10 Essential (primary) hypertension: Secondary | ICD-10-CM | POA: Insufficient documentation

## 2013-05-19 LAB — URINALYSIS, ROUTINE W REFLEX MICROSCOPIC
Bilirubin Urine: NEGATIVE
HGB URINE DIPSTICK: NEGATIVE
Ketones, ur: NEGATIVE
Leukocytes, UA: NEGATIVE
NITRITE: NEGATIVE
SPECIFIC GRAVITY, URINE: 1.02 (ref 1.000–1.030)
Total Protein, Urine: NEGATIVE
UROBILINOGEN UA: 0.2 (ref 0.0–1.0)
Urine Glucose: 500 — AB
pH: 6 (ref 5.0–8.0)

## 2013-05-19 MED ORDER — PIOGLITAZONE HCL 15 MG PO TABS: 15.0000 mg | ORAL_TABLET | Freq: Every day | ORAL | Status: DC

## 2013-05-19 NOTE — Progress Notes (Signed)
Quick Note:  Please let patient know that the lab result is normal and no further action needed ______ 

## 2013-05-19 NOTE — Progress Notes (Signed)
Patient ID: [name], female   DOB: May 24, 1935, 78 y.o.   MRN: [id number]  Reason for Appointment: Hospital follow-up   History of Present Illness   Dizziness- patient had a severe episode of vertigo requiring admission. Overall evaluation was negative and hospital course was as follows: on the [facility] maneuver showed worsening of dizziness on right side,  possible BPPV diagnosed.   Vestibular PT was obtained, she was sent home with HHPT patient did not want MRI of the brain. She was told to continue with meclizine. She has not had any further dizziness after an initial therapy with the physical therapist and she is getting home visits also  UTI- urine culture showed Enterobacter resistant to Rocephin, she was started on Cipro for total seven days of treatment.  Cough with greenish sputum: She was given Z pack recently by pulmonologist  DIABETES since 1980:   She has been on insulin using Lantus and Humalog for several years and usually A1c around 8% despite fairly good compliance with diet, exercise and glucose monitoring. She also had been on Actos previously which probably benefited her especially with her fatty liver but this was stopped because of potential weight gain. Her last A1c was 7.9 in April     Recent history: The patient's diabetes control appears to be overall somewhat worse with A1c 9% recently Her diabetes was poorly managed in the hospital with only minimal mealtime coverage given Although she has an average blood sugar of 187 in the last 2 weeks this is not much higher than previously. HIGHEST blood sugar currently appears to be before supper She appears to be requiring overall more insulin including basal insulin. Previously had done relatively better with taking Actos but she is still concerned about cancer side effects. Also this had helped her fatty liver in the past Current patterns: Her fasting blood sugars are less variable and improving, was 139  today Blood sugars may be periodically higher at lunchtime and frequently over 200 before and after supper This is despite her eating relatively small meals especially at lunchtime. Overall highest average blood sugar is midday and late evening Insulin regimen: Lantus twice a day, 42--31 and Humalog 8-10 a.c. 3 times a day    Oral hypoglycemic drugs: None        Side effects from medications: None         Proper timing of medications in relation to meals: Yes.         Monitors blood glucose:  4-5 times a day   Glucometer:  Accu-Chek         Blood Glucose readings from meter download:  PREMEAL Breakfast Lunch Dinner Bedtime  Overnight   Glucose range:  101-155   129-223   156-277  126-262   121-198   Mean/median:   195    210   158    POST-MEAL PC Breakfast PC Lunch PC Dinner  Glucose range:  147-258   142-247    Mean/median:      Hypoglycemia:  none recently, lowest glucose 101           Meals: 3 meals per day. Supper 6 to 7 PM     Physical activity: exercise: Unable to do much because of dyspnea           Dietician visit: Most recent: Several years ago           Complications: are: Peripheral neuropathy     Wt Readings from Last 3 Encounters:  05/19/13 198 lb 4.8 oz (89.948 kg)  05/12/13 198 lb 1.6 oz (89.858 kg)  02/12/13 199 lb 8 oz (90.493 kg)    Lab Results  Component Value Date   HGBA1C 9.0* 05/11/2013   HGBA1C 8.8* 02/12/2013   HGBA1C 8.8* 01/01/2013   Lab Results  Component Value Date   MICROALBUR 0.8 02/12/2013   LDLCALC 89 05/12/2013   CREATININE 0.56 05/12/2013       Medication List       This list is accurate as of: 05/19/13 10:03 AM.  Always use your most recent med list.               acetaminophen 500 MG tablet  Commonly known as:  TYLENOL  Take 500 mg by mouth every 6 (six) hours as needed for pain.     ALPRAZolam 0.5 MG tablet  Commonly known as:  XANAX  Take 0.5 tablets (0.25 mg total) by mouth at bedtime as needed for anxiety.     aspirin 325 MG  tablet  Take 325 mg by mouth daily.     azithromycin 250 MG tablet  Commonly known as:  ZITHROMAX Z-PAK  Take as directed.     B-D INS SYRINGE 0.5CC/31GX5/16 31G X 5/16" 0.5 ML Misc  Generic drug:  Insulin Syringe-Needle U-100     candesartan 32 MG tablet  Commonly known as:  ATACAND     ciprofloxacin 500 MG tablet  Commonly known as:  CIPRO  Take 1 tablet (500 mg total) by mouth 2 (two) times daily.     dicyclomine 10 MG capsule  Commonly known as:  BENTYL     fenofibrate micronized 134 MG capsule  Commonly known as:  LOFIBRA     insulin glargine 100 UNIT/ML injection  Commonly known as:  LANTUS  Inject 31-42 Units into the skin See admin instructions. Take 42 units in the morning and 31 units at bedtime     ipratropium 17 MCG/ACT inhaler  Commonly known as:  ATROVENT HFA  Inhale 2 puffs into the lungs 2 (two) times daily as needed (shortness of breath).     ketoconazole 2 % cream  Commonly known as:  NIZORAL  Apply topically daily.     levalbuterol 45 MCG/ACT inhaler  Commonly known as:  XOPENEX HFA  Inhale 2 puffs into the lungs every 6 (six) hours as needed for wheezing or shortness of breath.     magic mouthwash Soln  Take 10 mLs by mouth daily as needed (as needed to help with acid reflux per Dr. Delfin Edis).     meclizine 25 MG tablet  Commonly known as:  ANTIVERT  Take 25 mg by mouth 3 (three) times daily as needed for dizziness.     metoprolol succinate 25 MG 24 hr tablet  Commonly known as:  TOPROL-XL  Take 1 tablet (25 mg total) by mouth daily.     NOVOLOG PENFILL cartridge  Generic drug:  insulin aspart  Inject 1-8 Units into the skin 3 (three) times daily with meals. Sliding scale     omeprazole 20 MG capsule  Commonly known as:  PRILOSEC  Take 20 mg by mouth daily.     predniSONE 20 MG tablet  Commonly known as:  DELTASONE     promethazine-codeine 6.25-10 MG/5ML syrup  Commonly known as:  PHENERGAN with CODEINE  Take 5 mLs by mouth every 6  (six) hours as needed for cough.     tiotropium 18 MCG inhalation capsule  Commonly  known as:  SPIRIVA HANDIHALER  Place 1 capsule (18 mcg total) into inhaler and inhale daily.     valsartan 80 MG tablet  Commonly known as:  DIOVAN  Take 1 tablet (80 mg total) by mouth daily.        Allergies:  Allergies  Allergen Reactions  . Chlordiazepoxide-Clidinium Other (See Comments)    Sleepy,weak  . Biaxin [Clarithromycin]     Foul taste, abd pain, diarrhea  . Codeine     REACTION: gi upset  . Tramadol     Past Medical History  Diagnosis Date  . Irritable bowel syndrome   . Other chronic nonalcoholic liver disease   . Esophageal reflux   . Allergic rhinitis, cause unspecified     Sinus CT Rec 12-23-2009  . 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 Sciatica   . Shingles 2010  . Steatohepatitis   . Diabetes mellitus   . Diverticulosis   . Hiatal hernia   . Esophageal stricture   . Diarrhea   . GERD (gastroesophageal reflux disease)   . Shortness of breath   . Hypertension   . Arthritis   . Glaucoma   . Complication of anesthesia     takes along time to wake up  . Oxygen deficiency   . Benign paroxysmal positional vertigo 08/09/2012  . Neuropathy in diabetes 08/09/2012    Past Surgical History  Procedure Laterality Date  . Cholecystectomy open  1978  . Liver biopsy  08-1992  . Parathyroid exploration    . Tonsillectomy and adenoidectomy    . Total abdominal hysterectomy    . Esophagogastroduodenoscopy  3244,01-02    H Hernia,es.stricture s/p dil 54F  . Colonoscopy  07-2001    mild diverticulosis  . Carpal tunnel release      right hand  . Cataract extraction      Bilateral  . Ulnar nerve transposition  12/07/2011    Procedure: ULNAR NERVE DECOMPRESSION/TRANSPOSITION;this was cancelled-not done  Surgeon: Cammie Sickle., MD;  Location: [place];  Service: Orthopedics;  Laterality: Right;  right  ulnar nerve in situ decompression  . Ulnar tunnel release  03/07/2012    Procedure: CUBITAL TUNNEL RELEASE;  Surgeon: Roseanne Kaufman, MD;  Location: [place];  Service: Orthopedics;  Laterality: Right;  ulnar nerve release at the elbow      Family History  Problem Relation Age of Onset  . Lung cancer Mother     small cell;Byssinosis  . Heart disease Father   . Lung cancer Sister   . Liver cancer Sister     ? mets from another area of the body  . Diabetes      grandmother  . Stroke Maternal Grandfather     Social History:  reports that she quit smoking about 25 years ago. Her smoking use included Cigarettes. She smoked 0.00 packs per day. She has never used smokeless tobacco. She reports that she does not drink alcohol or use illicit drugs.  Review of Systems:  HYPERTENSION:  blood pressure is well-controlled now. No lightheadedness on standing up. Blood pressure has been variable at home. She is forgetting to take her Diovan in the evening and wants to try morning dosage  HYPERLIPIDEMIA: The lipid abnormality consists of elevated LDL previously treated with pravastatin which she had no side effects from. Since her triglycerides were relatively higher and LDL not significantly high she was told to try  fenofibrate but she has not taken this  She is followed by pulmonologist for chronic COPD, taking home oxygen and still having dyspnea  She has periodic diarrhea which has been better controlled with Questran, probably irritable bowel syndrome but not clear if she has neuropathy.  She has significant numbness in her feet and legs and has had some discomfort but usually not desiring medications because of fear of side effects. Also when she stands up she has a tendency to sway including backwards    Asking about a few strands of hair on her chin and face and hair loss  Lab Results  Component Value Date   TSH 4.572* 05/11/2013     Examination:   BP 134/82  Pulse  96  Temp(Src) 98.5 F (36.9 C)  Resp 12  Ht 5' 3"  (1.6 m)  Wt 198 lb 4.8 oz (89.948 kg)  BMI 35.14 kg/m2  SpO2 94%  Body mass index is 35.14 kg/(m^2).   No pedal edema Lungs clear to auscultation, no excessive prolonged expiration Heart sounds normal  Mild diffuse alopecia is present. No significant hirsutism or acne Romberg sign is significantly positive  ASSESSMENT/ PLAN::   Diabetes type 2   The patient's diabetes control appears to be inadequate again with overall high readings especially in the afternoons and evenings as discussed in history of present illness Blood sugars were higher in the hospital when she was not getting much mealtime coverage. She has been compliant with taking her mealtime insulin before eating Also requiring relatively larger amounts of Lantus now. She had stopped her Actos for fear of side effects or carcinogenesis but she did tolerated very well and it had helped her.  Discussed the following recommendations  Increase morning Lantus to 45 at least till afternoon readings are under 150  Increase coverage for breakfast and supper by 2 units  Start 15 mg of Actos daily for at least a month as a trial, may need to reduce insulin doses all around  Adjust Humalog based on meal size and carbohydrate intake  HYPERTENSION: Well-controlled, no change in medication needed  Episodes of vertigo: Likely to be benign positional as evaluated in the hospital. Appears to be better with vestibular manipulation by physical therapist and she can get this done as needed  Fatigue/weakness: This is secondary to multiple medical problems  Mild hirsutism/hair loss: Reassured her that she does not have any androgenic disorders as the hirsutism is minimal. Consider minoxidil for hair loss. Also will need followup thyroid levels in 3-4 months as current TSH is borderline  History of UTI recently: Will check her urinalysis again, may need followup with urologist  Recent  bronchitis: Treated by pulmonologist   Hyperlipidemia: She will resume fenofibrate as triglycerides are still significantly high, LDL was below 100  [name] 05/19/2013, 10:03 AM

## 2013-05-19 NOTE — Patient Instructions (Addendum)
HUMALOG 10 AT Breakfast, 8-10 at lunch; 10-12 at supper LANTUS 45 IN AM LANTUS 30 IN PM  PIOOGLITAZONE 15 MG DAILY  Resume Fenofibrate

## 2013-05-21 DIAGNOSIS — R42 Dizziness and giddiness: Secondary | ICD-10-CM | POA: Diagnosis not present

## 2013-05-21 DIAGNOSIS — IMO0001 Reserved for inherently not codable concepts without codable children: Secondary | ICD-10-CM | POA: Diagnosis not present

## 2013-05-21 DIAGNOSIS — H6690 Otitis media, unspecified, unspecified ear: Secondary | ICD-10-CM | POA: Diagnosis not present

## 2013-05-21 DIAGNOSIS — Z794 Long term (current) use of insulin: Secondary | ICD-10-CM | POA: Diagnosis not present

## 2013-05-21 DIAGNOSIS — N390 Urinary tract infection, site not specified: Secondary | ICD-10-CM | POA: Diagnosis not present

## 2013-05-21 DIAGNOSIS — J441 Chronic obstructive pulmonary disease with (acute) exacerbation: Secondary | ICD-10-CM | POA: Diagnosis not present

## 2013-05-22 ENCOUNTER — Telehealth: Payer: Self-pay | Admitting: *Deleted

## 2013-05-22 ENCOUNTER — Other Ambulatory Visit: Payer: Self-pay | Admitting: Endocrinology

## 2013-05-22 DIAGNOSIS — IMO0001 Reserved for inherently not codable concepts without codable children: Secondary | ICD-10-CM | POA: Diagnosis not present

## 2013-05-22 DIAGNOSIS — F410 Panic disorder [episodic paroxysmal anxiety] without agoraphobia: Secondary | ICD-10-CM

## 2013-05-22 DIAGNOSIS — J441 Chronic obstructive pulmonary disease with (acute) exacerbation: Secondary | ICD-10-CM | POA: Diagnosis not present

## 2013-05-22 DIAGNOSIS — N390 Urinary tract infection, site not specified: Secondary | ICD-10-CM | POA: Diagnosis not present

## 2013-05-22 DIAGNOSIS — H6690 Otitis media, unspecified, unspecified ear: Secondary | ICD-10-CM | POA: Diagnosis not present

## 2013-05-22 DIAGNOSIS — Z794 Long term (current) use of insulin: Secondary | ICD-10-CM | POA: Diagnosis not present

## 2013-05-22 DIAGNOSIS — R42 Dizziness and giddiness: Secondary | ICD-10-CM | POA: Diagnosis not present

## 2013-05-22 NOTE — Telephone Encounter (Signed)
Referral done

## 2013-05-22 NOTE — Telephone Encounter (Signed)
[name] said she had another "episode" last night. She also states that the nurse came from [facility] today and the nurse feels like she is suffering from PTSD and thinks a referral to a psychiatrist would be a good idea,  [place] wants to know if you could make that referral to a female Dr. For her.

## 2013-05-22 NOTE — Telephone Encounter (Signed)
Noted, patient is aware

## 2013-05-23 DIAGNOSIS — Z794 Long term (current) use of insulin: Secondary | ICD-10-CM | POA: Diagnosis not present

## 2013-05-23 DIAGNOSIS — H6690 Otitis media, unspecified, unspecified ear: Secondary | ICD-10-CM | POA: Diagnosis not present

## 2013-05-23 DIAGNOSIS — J441 Chronic obstructive pulmonary disease with (acute) exacerbation: Secondary | ICD-10-CM | POA: Diagnosis not present

## 2013-05-23 DIAGNOSIS — IMO0001 Reserved for inherently not codable concepts without codable children: Secondary | ICD-10-CM | POA: Diagnosis not present

## 2013-05-23 DIAGNOSIS — R42 Dizziness and giddiness: Secondary | ICD-10-CM | POA: Diagnosis not present

## 2013-05-23 DIAGNOSIS — N390 Urinary tract infection, site not specified: Secondary | ICD-10-CM | POA: Diagnosis not present

## 2013-05-26 ENCOUNTER — Telehealth: Payer: Self-pay | Admitting: Internal Medicine

## 2013-05-26 DIAGNOSIS — R42 Dizziness and giddiness: Secondary | ICD-10-CM | POA: Diagnosis not present

## 2013-05-26 DIAGNOSIS — H6690 Otitis media, unspecified, unspecified ear: Secondary | ICD-10-CM | POA: Diagnosis not present

## 2013-05-26 DIAGNOSIS — IMO0001 Reserved for inherently not codable concepts without codable children: Secondary | ICD-10-CM | POA: Diagnosis not present

## 2013-05-26 DIAGNOSIS — N390 Urinary tract infection, site not specified: Secondary | ICD-10-CM | POA: Diagnosis not present

## 2013-05-26 DIAGNOSIS — J441 Chronic obstructive pulmonary disease with (acute) exacerbation: Secondary | ICD-10-CM | POA: Diagnosis not present

## 2013-05-26 DIAGNOSIS — Z794 Long term (current) use of insulin: Secondary | ICD-10-CM | POA: Diagnosis not present

## 2013-05-26 NOTE — Telephone Encounter (Signed)
Pt is aware of CY recs. Nothing further was needed.

## 2013-05-26 NOTE — Telephone Encounter (Signed)
Breathing meds might cause some mild tremor, but would not interfere with walking. A good psychiatrist would be Dr Toy Care (female) or Dr Casimiro Needle.

## 2013-05-26 NOTE — Telephone Encounter (Signed)
Spoke with pt. Wants to know if her breathing can cause her to have tremors and cause her not be able to walk. When she was in the hospital they told her to that she needed to see a psychiatrist to be prescribed medication for her nerves. Pt is wanting to know if CY has any recs about a certain psychiatrist.  CY - please advise on pt's questions. Thanks.

## 2013-05-27 DIAGNOSIS — L659 Nonscarring hair loss, unspecified: Secondary | ICD-10-CM | POA: Diagnosis not present

## 2013-05-27 DIAGNOSIS — I10 Essential (primary) hypertension: Secondary | ICD-10-CM | POA: Diagnosis not present

## 2013-05-27 DIAGNOSIS — E1165 Type 2 diabetes mellitus with hyperglycemia: Secondary | ICD-10-CM

## 2013-05-27 DIAGNOSIS — H8110 Benign paroxysmal vertigo, unspecified ear: Secondary | ICD-10-CM | POA: Diagnosis not present

## 2013-05-27 DIAGNOSIS — IMO0001 Reserved for inherently not codable concepts without codable children: Secondary | ICD-10-CM | POA: Diagnosis not present

## 2013-05-28 DIAGNOSIS — H6690 Otitis media, unspecified, unspecified ear: Secondary | ICD-10-CM | POA: Diagnosis not present

## 2013-05-28 DIAGNOSIS — IMO0001 Reserved for inherently not codable concepts without codable children: Secondary | ICD-10-CM | POA: Diagnosis not present

## 2013-05-28 DIAGNOSIS — Z794 Long term (current) use of insulin: Secondary | ICD-10-CM | POA: Diagnosis not present

## 2013-05-28 DIAGNOSIS — J441 Chronic obstructive pulmonary disease with (acute) exacerbation: Secondary | ICD-10-CM | POA: Diagnosis not present

## 2013-05-28 DIAGNOSIS — N390 Urinary tract infection, site not specified: Secondary | ICD-10-CM | POA: Diagnosis not present

## 2013-05-28 DIAGNOSIS — R42 Dizziness and giddiness: Secondary | ICD-10-CM | POA: Diagnosis not present

## 2013-05-29 DIAGNOSIS — N390 Urinary tract infection, site not specified: Secondary | ICD-10-CM | POA: Diagnosis not present

## 2013-05-29 DIAGNOSIS — R42 Dizziness and giddiness: Secondary | ICD-10-CM | POA: Diagnosis not present

## 2013-05-29 DIAGNOSIS — IMO0001 Reserved for inherently not codable concepts without codable children: Secondary | ICD-10-CM | POA: Diagnosis not present

## 2013-05-29 DIAGNOSIS — J441 Chronic obstructive pulmonary disease with (acute) exacerbation: Secondary | ICD-10-CM | POA: Diagnosis not present

## 2013-05-29 DIAGNOSIS — Z794 Long term (current) use of insulin: Secondary | ICD-10-CM | POA: Diagnosis not present

## 2013-05-29 DIAGNOSIS — H6690 Otitis media, unspecified, unspecified ear: Secondary | ICD-10-CM | POA: Diagnosis not present

## 2013-06-02 DIAGNOSIS — H6690 Otitis media, unspecified, unspecified ear: Secondary | ICD-10-CM | POA: Diagnosis not present

## 2013-06-02 DIAGNOSIS — R42 Dizziness and giddiness: Secondary | ICD-10-CM | POA: Diagnosis not present

## 2013-06-02 DIAGNOSIS — Z794 Long term (current) use of insulin: Secondary | ICD-10-CM | POA: Diagnosis not present

## 2013-06-02 DIAGNOSIS — N390 Urinary tract infection, site not specified: Secondary | ICD-10-CM | POA: Diagnosis not present

## 2013-06-02 DIAGNOSIS — IMO0001 Reserved for inherently not codable concepts without codable children: Secondary | ICD-10-CM | POA: Diagnosis not present

## 2013-06-02 DIAGNOSIS — J441 Chronic obstructive pulmonary disease with (acute) exacerbation: Secondary | ICD-10-CM | POA: Diagnosis not present

## 2013-06-05 DIAGNOSIS — J441 Chronic obstructive pulmonary disease with (acute) exacerbation: Secondary | ICD-10-CM | POA: Diagnosis not present

## 2013-06-05 DIAGNOSIS — N390 Urinary tract infection, site not specified: Secondary | ICD-10-CM | POA: Diagnosis not present

## 2013-06-05 DIAGNOSIS — H6690 Otitis media, unspecified, unspecified ear: Secondary | ICD-10-CM | POA: Diagnosis not present

## 2013-06-05 DIAGNOSIS — R42 Dizziness and giddiness: Secondary | ICD-10-CM | POA: Diagnosis not present

## 2013-06-05 DIAGNOSIS — Z794 Long term (current) use of insulin: Secondary | ICD-10-CM | POA: Diagnosis not present

## 2013-06-05 DIAGNOSIS — IMO0001 Reserved for inherently not codable concepts without codable children: Secondary | ICD-10-CM | POA: Diagnosis not present

## 2013-06-09 DIAGNOSIS — R42 Dizziness and giddiness: Secondary | ICD-10-CM | POA: Diagnosis not present

## 2013-06-09 DIAGNOSIS — N390 Urinary tract infection, site not specified: Secondary | ICD-10-CM | POA: Diagnosis not present

## 2013-06-09 DIAGNOSIS — H6690 Otitis media, unspecified, unspecified ear: Secondary | ICD-10-CM | POA: Diagnosis not present

## 2013-06-09 DIAGNOSIS — Z794 Long term (current) use of insulin: Secondary | ICD-10-CM | POA: Diagnosis not present

## 2013-06-09 DIAGNOSIS — J441 Chronic obstructive pulmonary disease with (acute) exacerbation: Secondary | ICD-10-CM | POA: Diagnosis not present

## 2013-06-09 DIAGNOSIS — IMO0001 Reserved for inherently not codable concepts without codable children: Secondary | ICD-10-CM | POA: Diagnosis not present

## 2013-06-11 DIAGNOSIS — R42 Dizziness and giddiness: Secondary | ICD-10-CM | POA: Diagnosis not present

## 2013-06-11 DIAGNOSIS — IMO0001 Reserved for inherently not codable concepts without codable children: Secondary | ICD-10-CM | POA: Diagnosis not present

## 2013-06-11 DIAGNOSIS — N390 Urinary tract infection, site not specified: Secondary | ICD-10-CM | POA: Diagnosis not present

## 2013-06-11 DIAGNOSIS — J441 Chronic obstructive pulmonary disease with (acute) exacerbation: Secondary | ICD-10-CM | POA: Diagnosis not present

## 2013-06-11 DIAGNOSIS — H6690 Otitis media, unspecified, unspecified ear: Secondary | ICD-10-CM | POA: Diagnosis not present

## 2013-06-11 DIAGNOSIS — Z794 Long term (current) use of insulin: Secondary | ICD-10-CM | POA: Diagnosis not present

## 2013-06-12 DIAGNOSIS — IMO0001 Reserved for inherently not codable concepts without codable children: Secondary | ICD-10-CM | POA: Diagnosis not present

## 2013-06-12 DIAGNOSIS — J441 Chronic obstructive pulmonary disease with (acute) exacerbation: Secondary | ICD-10-CM | POA: Diagnosis not present

## 2013-06-12 DIAGNOSIS — N390 Urinary tract infection, site not specified: Secondary | ICD-10-CM | POA: Diagnosis not present

## 2013-06-12 DIAGNOSIS — Z794 Long term (current) use of insulin: Secondary | ICD-10-CM | POA: Diagnosis not present

## 2013-06-12 DIAGNOSIS — H6690 Otitis media, unspecified, unspecified ear: Secondary | ICD-10-CM | POA: Diagnosis not present

## 2013-06-12 DIAGNOSIS — R42 Dizziness and giddiness: Secondary | ICD-10-CM | POA: Diagnosis not present

## 2013-06-18 ENCOUNTER — Encounter: Payer: Self-pay | Admitting: Endocrinology

## 2013-06-18 ENCOUNTER — Other Ambulatory Visit: Payer: Self-pay | Admitting: *Deleted

## 2013-06-18 ENCOUNTER — Ambulatory Visit (INDEPENDENT_AMBULATORY_CARE_PROVIDER_SITE_OTHER): Payer: Medicare Other | Admitting: Endocrinology

## 2013-06-18 VITALS — BP 118/70 | HR 99 | Temp 98.3°F | Resp 16 | Ht 63.0 in | Wt 198.8 lb

## 2013-06-18 DIAGNOSIS — R5381 Other malaise: Secondary | ICD-10-CM

## 2013-06-18 DIAGNOSIS — E1165 Type 2 diabetes mellitus with hyperglycemia: Principal | ICD-10-CM

## 2013-06-18 DIAGNOSIS — R5383 Other fatigue: Secondary | ICD-10-CM | POA: Diagnosis not present

## 2013-06-18 DIAGNOSIS — IMO0001 Reserved for inherently not codable concepts without codable children: Secondary | ICD-10-CM | POA: Diagnosis not present

## 2013-06-18 MED ORDER — CHOLESTYRAMINE LIGHT 4 G PO PACK: 4.0000 g | PACK | Freq: Two times a day (BID) | ORAL | Status: DC

## 2013-06-18 MED ORDER — FENOFIBRATE 145 MG PO TABS: 145.0000 mg | ORAL_TABLET | Freq: Every day | ORAL | Status: DC

## 2013-06-18 NOTE — Progress Notes (Signed)
Patient ID: [name], female   DOB: Dec 26, 1935, 78 y.o.   MRN: [id number]   Reason for Appointment: follow-up of multiple problems  History of Present Illness   Dizziness- during her hospitalization  possible BPPV was diagnosed.  Vestibular PT was done which was successful She has not had any further sudden episodes of vertigo but does feel off-balance and she calls this dizziness also. No headaches or nausea   Diarrhea: She has had long-standing intermittent diarrhea. A few days ago her diarrhea came back and was fairly frequent. She has not refilled her Questran and has used only Imodium OTC  DIABETES since 1980:   She has been on insulin using Lantus and Humalog for several years and usually A1c around 8% despite fairly good compliance with diet, exercise and glucose monitoring. She also had been on Actos previously which probably benefited her especially with her fatty liver but this was stopped because of fear of side effects and she has not resumed this.      Recent history: The patient's diabetes control appears to be overall  About the same blood sugars at home averaging 171 She does have several readings over 200 but recently no consistent trend Overall highest average blood sugar is after breakfast, midday and late evening Insulin regimen: Lantus twice a day, 45--30 and Humalog 8-10 a.c. 3 times a day    Oral hypoglycemic drugs: None        Side effects from medications: None         Proper timing of medications in relation to meals: Yes.         Monitors blood glucose:  4-5 times a day   Glucometer:  Accu-Chek         Blood Glucose readings recently from meter download:  PREMEAL Breakfast Lunch Dinner Bedtime Overall  Glucose range:  104-176   100-234   115-211   118-231    Mean/median:        POST-MEAL PC Breakfast PC Lunch PC Dinner  Glucose range:  160-231     Mean/median:  183    188    Hypoglycemia:  none recently,           Meals: 3 meals per day. Supper 6 to  7 PM     Physical activity: exercise: Unable to do much because of dyspnea           Dietician visit: Most recent: Several years ago           Complications: are: Peripheral neuropathy     Wt Readings from Last 3 Encounters:  06/18/13 198 lb 12.8 oz (90.175 kg)  05/19/13 198 lb 4.8 oz (89.948 kg)  05/12/13 198 lb 1.6 oz (89.858 kg)    Lab Results  Component Value Date   HGBA1C 9.0* 05/11/2013   HGBA1C 8.8* 02/12/2013   HGBA1C 8.8* 01/01/2013   Lab Results  Component Value Date   MICROALBUR 0.8 02/12/2013   LDLCALC 89 05/12/2013   CREATININE 0.56 05/12/2013       Medication List       This list is accurate as of: 06/18/13 10:16 AM.  Always use your most recent med list.               acetaminophen 500 MG tablet  Commonly known as:  TYLENOL  Take 500 mg by mouth every 6 (six) hours as needed for pain.     ALPRAZolam 0.5 MG tablet  Commonly known as:  Duanne Moron  Take 0.5 tablets (0.25 mg total) by mouth at bedtime as needed for anxiety.     aspirin 325 MG tablet  Take 325 mg by mouth daily.     B-D INS SYRINGE 0.5CC/31GX5/16 31G X 5/16" 0.5 ML Misc  Generic drug:  Insulin Syringe-Needle U-100     dicyclomine 10 MG capsule  Commonly known as:  BENTYL     insulin glargine 100 UNIT/ML injection  Commonly known as:  LANTUS  Inject 31-42 Units into the skin See admin instructions. Take 42 units in the morning and 31 units at bedtime     ipratropium 17 MCG/ACT inhaler  Commonly known as:  ATROVENT HFA  Inhale 2 puffs into the lungs 2 (two) times daily as needed (shortness of breath).     ketoconazole 2 % cream  Commonly known as:  NIZORAL  Apply topically daily.     levalbuterol 45 MCG/ACT inhaler  Commonly known as:  XOPENEX HFA  Inhale 2 puffs into the lungs every 6 (six) hours as needed for wheezing or shortness of breath.     magic mouthwash Soln  Take 10 mLs by mouth daily as needed (as needed to help with acid reflux per Dr. Delfin Edis).     meclizine 25 MG  tablet  Commonly known as:  ANTIVERT  Take 25 mg by mouth 3 (three) times daily as needed for dizziness.     metoprolol succinate 25 MG 24 hr tablet  Commonly known as:  TOPROL-XL  Take 1 tablet (25 mg total) by mouth daily.     omeprazole 20 MG capsule  Commonly known as:  PRILOSEC  Take 20 mg by mouth daily.     pioglitazone 15 MG tablet  Commonly known as:  ACTOS  Take 1 tablet (15 mg total) by mouth daily.     predniSONE 20 MG tablet  Commonly known as:  DELTASONE     promethazine-codeine 6.25-10 MG/5ML syrup  Commonly known as:  PHENERGAN with CODEINE  Take 5 mLs by mouth every 6 (six) hours as needed for cough.     tiotropium 18 MCG inhalation capsule  Commonly known as:  SPIRIVA HANDIHALER  Place 1 capsule (18 mcg total) into inhaler and inhale daily.     valsartan 80 MG tablet  Commonly known as:  DIOVAN  Take 1 tablet (80 mg total) by mouth daily.        Allergies:  Allergies  Allergen Reactions  . Chlordiazepoxide-Clidinium Other (See Comments)    Sleepy,weak  . Biaxin [Clarithromycin]     Foul taste, abd pain, diarrhea  . Codeine     REACTION: gi upset  . Tramadol     Past Medical History  Diagnosis Date  . Irritable bowel syndrome   . Other chronic nonalcoholic liver disease   . Esophageal reflux   . Allergic rhinitis, cause unspecified     Sinus CT Rec 12-23-2009  . 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 Sciatica   . Shingles 2010  . Steatohepatitis   . Diabetes mellitus   . Diverticulosis   . Hiatal hernia   . Esophageal stricture   . Diarrhea   . GERD (gastroesophageal reflux disease)   . Shortness of breath   . Hypertension   . Arthritis   . Glaucoma   . Complication of anesthesia     takes along time to wake up  . Oxygen deficiency   . Benign paroxysmal  positional vertigo 08/09/2012  . Neuropathy in diabetes 08/09/2012    Past Surgical History  Procedure Laterality Date  .  Cholecystectomy open  1978  . Liver biopsy  08-1992  . Parathyroid exploration    . Tonsillectomy and adenoidectomy    . Total abdominal hysterectomy    . Esophagogastroduodenoscopy  8366,29-47    H Hernia,es.stricture s/p dil 36F  . Colonoscopy  07-2001    mild diverticulosis  . Carpal tunnel release      right hand  . Cataract extraction      Bilateral  . Ulnar nerve transposition  12/07/2011    Procedure: ULNAR NERVE DECOMPRESSION/TRANSPOSITION;this was cancelled-not done  Surgeon: Cammie Sickle., MD;  Location: [place];  Service: Orthopedics;  Laterality: Right;  right ulnar nerve in situ decompression  . Ulnar tunnel release  03/07/2012    Procedure: CUBITAL TUNNEL RELEASE;  Surgeon: Roseanne Kaufman, MD;  Location: [place];  Service: Orthopedics;  Laterality: Right;  ulnar nerve release at the elbow      Family History  Problem Relation Age of Onset  . Lung cancer Mother     small cell;Byssinosis  . Heart disease Father   . Lung cancer Sister   . Liver cancer Sister     ? mets from another area of the body  . Diabetes      grandmother  . Stroke Maternal Grandfather     Social History:  reports that she quit smoking about 25 years ago. Her smoking use included Cigarettes. She smoked 0.00 packs per day. She has never used smokeless tobacco. She reports that she does not drink alcohol or use illicit drugs.  Review of Systems:  HYPERTENSION:  130/70 blood pressure is well-controlled now. No lightheadedness on standing up. Blood pressure has been variable at home. She is forgetting to take her Diovan in the evening and wants to try morning dosage  HYPERLIPIDEMIA: The lipid abnormality consists of elevated LDL previously treated with pravastatin which she had no side effects from. She tried this again and had pain in the right arm  muscle and stopped it. Since her triglycerides were relatively higher and LDL not significantly high she was  told to try fenofibrate but again she has not taken this  Lab Results  Component Value Date   CHOL 178 05/12/2013   HDL 37* 05/12/2013   LDLCALC 89 05/12/2013   LDLDIRECT 149.0 02/12/2013   TRIG 261* 05/12/2013   CHOLHDL 4.8 05/12/2013    She is followed by pulmonologist for chronic COPD, taking home oxygen and still having dyspnea  She has significant numbness in her feet and legs and has had some discomfort but usually not desiring medications because of fear of side effects. Also when she stands up she has a tendency to sway including backwards    Still complaining of fatigue.  Lab Results  Component Value Date   TSH 4.572* 05/11/2013     Examination:   BP 118/70  Pulse 99  Temp(Src) 98.3 F (36.8 C)  Resp 16  Ht 5' 3"  (1.6 m)  Wt 198 lb 12.8 oz (90.175 kg)  BMI 35.22 kg/m2  SpO2 90%  Body mass index is 35.22 kg/(m^2).   Standing BP 140/70  No ankle edema  ASSESSMENT/ PLAN::   Diabetes type 2   The patient's diabetes control appears to be fairly good recently with no consistent trend and sporadic readings over 200 Will continue the same insulin for now  HYPERTENSION: Well-controlled, no change in medication needed  Episodes of dizziness/imbalance: This is likely to be from her severe neuropathy and loss of position sense Offered her another round of physical therapy for vestibular training if needed  Fatigue/weakness: This is secondary to multiple medical problems  Hyperlipidemia: She will resume fenofibrate as triglycerides are still significantly high, LDL was below 100  Diarrhea: She will resume her Questran  Tesa Meadors 06/18/2013, 10:16 AM

## 2013-06-18 NOTE — Patient Instructions (Signed)
Fenofibrate once daily

## 2013-06-20 DIAGNOSIS — J441 Chronic obstructive pulmonary disease with (acute) exacerbation: Secondary | ICD-10-CM | POA: Diagnosis not present

## 2013-06-20 DIAGNOSIS — IMO0001 Reserved for inherently not codable concepts without codable children: Secondary | ICD-10-CM | POA: Diagnosis not present

## 2013-06-20 DIAGNOSIS — R42 Dizziness and giddiness: Secondary | ICD-10-CM | POA: Diagnosis not present

## 2013-06-20 DIAGNOSIS — H6690 Otitis media, unspecified, unspecified ear: Secondary | ICD-10-CM | POA: Diagnosis not present

## 2013-06-20 DIAGNOSIS — N390 Urinary tract infection, site not specified: Secondary | ICD-10-CM | POA: Diagnosis not present

## 2013-06-20 DIAGNOSIS — Z794 Long term (current) use of insulin: Secondary | ICD-10-CM | POA: Diagnosis not present

## 2013-06-23 ENCOUNTER — Telehealth: Payer: Self-pay | Admitting: *Deleted

## 2013-06-23 DIAGNOSIS — N390 Urinary tract infection, site not specified: Secondary | ICD-10-CM | POA: Diagnosis not present

## 2013-06-23 DIAGNOSIS — IMO0001 Reserved for inherently not codable concepts without codable children: Secondary | ICD-10-CM | POA: Diagnosis not present

## 2013-06-23 DIAGNOSIS — J441 Chronic obstructive pulmonary disease with (acute) exacerbation: Secondary | ICD-10-CM | POA: Diagnosis not present

## 2013-06-23 DIAGNOSIS — R42 Dizziness and giddiness: Secondary | ICD-10-CM | POA: Diagnosis not present

## 2013-06-23 DIAGNOSIS — H6690 Otitis media, unspecified, unspecified ear: Secondary | ICD-10-CM | POA: Diagnosis not present

## 2013-06-23 DIAGNOSIS — Z794 Long term (current) use of insulin: Secondary | ICD-10-CM | POA: Diagnosis not present

## 2013-06-23 NOTE — Telephone Encounter (Signed)
Increase Lantus by 4 units twice a day and take extra 4 units of Humalog with every meal until blood sugar below 180

## 2013-06-23 NOTE — Telephone Encounter (Signed)
Patient said her sugars have been running high, yesterday it was up to 607 and today at 7:41 it was 212 at 9:57 it was 274, she also said she has cystitis again, wants to know if she should go up on her insulin?

## 2013-06-23 NOTE — Telephone Encounter (Signed)
Results left on her voicemail

## 2013-06-25 DIAGNOSIS — Z794 Long term (current) use of insulin: Secondary | ICD-10-CM | POA: Diagnosis not present

## 2013-06-25 DIAGNOSIS — IMO0001 Reserved for inherently not codable concepts without codable children: Secondary | ICD-10-CM | POA: Diagnosis not present

## 2013-06-25 DIAGNOSIS — N390 Urinary tract infection, site not specified: Secondary | ICD-10-CM | POA: Diagnosis not present

## 2013-06-25 DIAGNOSIS — R42 Dizziness and giddiness: Secondary | ICD-10-CM | POA: Diagnosis not present

## 2013-06-25 DIAGNOSIS — J441 Chronic obstructive pulmonary disease with (acute) exacerbation: Secondary | ICD-10-CM | POA: Diagnosis not present

## 2013-06-25 DIAGNOSIS — H6690 Otitis media, unspecified, unspecified ear: Secondary | ICD-10-CM | POA: Diagnosis not present

## 2013-06-27 ENCOUNTER — Telehealth: Payer: Self-pay | Admitting: *Deleted

## 2013-06-27 NOTE — Telephone Encounter (Signed)
She is having pins and needles sensations in the left upper chest starting around the area above the breast. Has short lasting pains. Does not want to take any medications or analgesics in case she has side effects. She will call if symptoms worsen and she will try Lyrica 50 mg if needed

## 2013-06-27 NOTE — Telephone Encounter (Signed)
Patient called, she said a couple days ago she started having pain over the left side of her chest, she thought it could be the neuropathy, she said it lasted a short time and went away, then last night it came back.  She wants to know if you think it could be neuropathy versus heart?

## 2013-06-30 DIAGNOSIS — J441 Chronic obstructive pulmonary disease with (acute) exacerbation: Secondary | ICD-10-CM | POA: Diagnosis not present

## 2013-06-30 DIAGNOSIS — R42 Dizziness and giddiness: Secondary | ICD-10-CM | POA: Diagnosis not present

## 2013-06-30 DIAGNOSIS — IMO0001 Reserved for inherently not codable concepts without codable children: Secondary | ICD-10-CM | POA: Diagnosis not present

## 2013-06-30 DIAGNOSIS — Z794 Long term (current) use of insulin: Secondary | ICD-10-CM | POA: Diagnosis not present

## 2013-06-30 DIAGNOSIS — H6690 Otitis media, unspecified, unspecified ear: Secondary | ICD-10-CM | POA: Diagnosis not present

## 2013-06-30 DIAGNOSIS — N390 Urinary tract infection, site not specified: Secondary | ICD-10-CM | POA: Diagnosis not present

## 2013-07-01 ENCOUNTER — Other Ambulatory Visit: Payer: Self-pay | Admitting: *Deleted

## 2013-07-01 DIAGNOSIS — IMO0001 Reserved for inherently not codable concepts without codable children: Secondary | ICD-10-CM | POA: Diagnosis not present

## 2013-07-01 DIAGNOSIS — R42 Dizziness and giddiness: Secondary | ICD-10-CM | POA: Diagnosis not present

## 2013-07-01 DIAGNOSIS — Z794 Long term (current) use of insulin: Secondary | ICD-10-CM | POA: Diagnosis not present

## 2013-07-01 DIAGNOSIS — H6690 Otitis media, unspecified, unspecified ear: Secondary | ICD-10-CM | POA: Diagnosis not present

## 2013-07-01 DIAGNOSIS — J441 Chronic obstructive pulmonary disease with (acute) exacerbation: Secondary | ICD-10-CM | POA: Diagnosis not present

## 2013-07-01 DIAGNOSIS — N390 Urinary tract infection, site not specified: Secondary | ICD-10-CM | POA: Diagnosis not present

## 2013-07-01 MED ORDER — GLUCOSE BLOOD VI STRP: ORAL_STRIP | Status: DC

## 2013-07-02 ENCOUNTER — Other Ambulatory Visit: Payer: Self-pay | Admitting: *Deleted

## 2013-07-02 MED ORDER — ALPRAZOLAM 0.5 MG PO TABS: ORAL_TABLET | ORAL | Status: DC

## 2013-07-08 ENCOUNTER — Telehealth: Payer: Self-pay | Admitting: Internal Medicine

## 2013-07-08 DIAGNOSIS — R42 Dizziness and giddiness: Secondary | ICD-10-CM | POA: Diagnosis not present

## 2013-07-08 DIAGNOSIS — N390 Urinary tract infection, site not specified: Secondary | ICD-10-CM | POA: Diagnosis not present

## 2013-07-08 DIAGNOSIS — H6690 Otitis media, unspecified, unspecified ear: Secondary | ICD-10-CM | POA: Diagnosis not present

## 2013-07-08 DIAGNOSIS — Z794 Long term (current) use of insulin: Secondary | ICD-10-CM | POA: Diagnosis not present

## 2013-07-08 DIAGNOSIS — J441 Chronic obstructive pulmonary disease with (acute) exacerbation: Secondary | ICD-10-CM | POA: Diagnosis not present

## 2013-07-08 DIAGNOSIS — IMO0001 Reserved for inherently not codable concepts without codable children: Secondary | ICD-10-CM | POA: Diagnosis not present

## 2013-07-08 NOTE — Telephone Encounter (Signed)
Spoke with pt. States that her insurance will not cover Promethazine-Codeine cough syrup. Has to pay out of pocket for this and it has gone up $13. Wants to know of an OTC medication she can use instead. Advised her of Delsym. She is going to try this and let us know if she has any questions or problems.

## 2013-07-15 ENCOUNTER — Telehealth: Payer: Self-pay | Admitting: Endocrinology

## 2013-07-15 ENCOUNTER — Telehealth: Payer: Self-pay | Admitting: Internal Medicine

## 2013-07-15 ENCOUNTER — Telehealth: Payer: Self-pay | Admitting: *Deleted

## 2013-07-15 MED ORDER — PROMETHAZINE-CODEINE 6.25-10 MG/5ML PO SYRP: 5.0000 mL | ORAL_SOLUTION | Freq: Four times a day (QID) | ORAL | Status: DC | PRN

## 2013-07-15 NOTE — Telephone Encounter (Signed)
Rx has been called in. Pt is aware. Nothing further is needed.

## 2013-07-15 NOTE — Telephone Encounter (Signed)
Last OV 01/21/13 Pending OV 07/21/13 Last fill 04/14/13 #174m  CY - please advise on refill. Thanks.

## 2013-07-15 NOTE — Telephone Encounter (Signed)
Patient says she's is taking the actos at dinner time but her sugars are dropping low, she said she's not snacking or eating as much, her sugars for the last 3 days are: 10th 8:05 am 69 fasting 9th 6:09 am 99, 5:28 pm 79, 11:13 pm 116 8th, 3:00 am 113, 11:40pm 109, 5:29 am 123, 2:52 pm 177, 12:22 am 149, 6:40 pm 111, 2:24 am 98 7th 11:11 pm 124

## 2013-07-15 NOTE — Telephone Encounter (Signed)
Ok to refill as requested 

## 2013-07-15 NOTE — Telephone Encounter (Signed)
Reduce Lantus by 4 units in the evening and 2 units in the morning

## 2013-07-15 NOTE — Telephone Encounter (Signed)
Noted patient is aware

## 2013-07-15 NOTE — Telephone Encounter (Signed)
The actose dosage is too high, BS are too low.

## 2013-07-21 ENCOUNTER — Ambulatory Visit (INDEPENDENT_AMBULATORY_CARE_PROVIDER_SITE_OTHER): Payer: Medicare Other | Admitting: Internal Medicine

## 2013-07-21 ENCOUNTER — Ambulatory Visit (INDEPENDENT_AMBULATORY_CARE_PROVIDER_SITE_OTHER)
Admission: RE | Admit: 2013-07-21 | Discharge: 2013-07-21 | Disposition: A | Discharge status: Home / Self Care | Payer: Medicare Other | Source: Ambulatory Visit | Attending: Internal Medicine | Admitting: Internal Medicine

## 2013-07-21 ENCOUNTER — Encounter: Payer: Self-pay | Admitting: Internal Medicine

## 2013-07-21 VITALS — BP 126/66 | HR 81 | Ht 63.0 in | Wt 203.2 lb

## 2013-07-21 DIAGNOSIS — J310 Chronic rhinitis: Secondary | ICD-10-CM | POA: Diagnosis not present

## 2013-07-21 DIAGNOSIS — J449 Chronic obstructive pulmonary disease, unspecified: Secondary | ICD-10-CM | POA: Diagnosis not present

## 2013-07-21 MED ORDER — LEVALBUTEROL HCL 0.63 MG/3ML IN NEBU
0.6300 mg | INHALATION_SOLUTION | Freq: Once | RESPIRATORY_TRACT | Status: AC
  Administered 2013-07-21: 0.63 mg via RESPIRATORY_TRACT

## 2013-07-21 NOTE — Progress Notes (Signed)
Patient ID: [name], female    DOB: 06/20/1935, 79 y.o.   MRN: [id number]  HPI SATURATION QUALIFICATIONS: Patient Saturations on Room Air while Ambulating = 84% Patient Saturations on 2.5 Liters of oxygen while Ambulating = 92%[name],CMA  09/28/10-  75 yoF former smoker with COPD, complicated by chronic rhinitis, GERD. Last here 04/29/10- note reviewed. Acute visit.-- Woke with soreness across under her breasts. Losing voice. Coughing spells, with much liquid coming up and from nose. Prior to that hadn't been coughing. C/o weak, dizzy, light headed- relieved by her oxygen. . Moving into a smaller apartment. Denies fever. Easy DOE short walks room to room.   10/31/10- 53 yoF former smoker with COPD, complicated by chronic rhinitis, GERD. She denies major change since last here. Has to stay in to avoid heat and on her oxygen much of the time in the house. Coughs scant clear or lemon yellow, with some hoarseness which comes and goes. She describes hoarseness as worse with phone conversations, variable but rarely if ever clear. She had seen Dr [name] years ago for epistaxis. Easy choke with eating. Discussed aspiration precautions, chin tuck.   02/26/11- 37 yoF former smoker with COPD, complicated by chronic rhinitis, GERD. Has had flu vaccine. No coughing through the summer. We first cool days as she stepped outside she would begin to cough again. Dry hacking cough. Very aware of active reflux anytime she bends over, regurgitating recently swallowed food. Has also had some diarrhea. Complains of spasms intermittently left lower anterior chest and left upper quadrant. It may all be GI. She thinks it is part of her diabetic neuropathy. Has appointment soon with Dr.Brodie.  08/16/11-75 yoF former smoker with COPD, complicated by chronic rhinitis, GERD. During the winter had prolonged diarrhea evaluated by Dr [name] and apparently attributed to her diabetic neuropathy. May have had  esophagitis-was told to take Magic mouthwash. Gradually easier dyspnea on exertion. Now on oxygen 2-3 L most of the time. She came today with acute complaint of aching pain intermittently for the past several weeks, dull. Associated with mid back at strap level. Pain is gone right now. She saw no effect of activity or position and said pain was nonpleuritic or radiating.  CXR- 08/16/11- reviewed w/ her. NAD with no acute process.   08/28/11-75 yoF former smoker with COPD, complicated by chronic rhinitis, GERD,  DM. Back pain comes and goes. She implies that it is better today and says that she really has been present a long time as part of her degenerative back pain. Complains of feeling sluggish and tired. Recent bronchitis with productive green sputum watery nose no fever. Using Atrovent. Intolerant of albuterol and Symbicort because of overstimulation. Home oxygen is 2-1/2-3 L. CXR 08/15/11-  IMPRESSION:  Hyperinflation.  No active cardiopulmonary disease.  Original Report Authenticated By: [name], M.D.   12/28/11- 77 yoF former smoker with COPD, complicated by chronic rhinitis, GERD,  DM. Not doing well-stays on O2 all the time; since being on Spiriva at first did great but has noticed having to stop more and rest during the day(nap) She  stays indoors most of the time without many friends to do things with. She often has self-limited sharp twinges right anterior chest wall and left infrascapular back. With her known degenerative disc disease, we discussed the possibility this was nerve root irritation. Spiriva helps variable cough.  04/23/12- 63 yoF former smoker with COPD, complicated by chronic rhinitis, GERD,  DM. FOLLOWS FOR: still  has good and bad days with SOB and activity levels Watery rhinorrhea. No acute problems. No respiratory problems associated with surgery for nerve impingement in her right hand. She continues oxygen 3 or 3 a half liters/Advanced PFT: 01/16/2012 severe  obstructive airways disease with insignificant response to bronchodilator, air trapping, diffusion moderately reduced. FEV1 0.82/45%, FEV1/FEC 0.46. Emphysema pattern on the loop. TLC 100%, RV 148%, DLCO 47%.  10/22/12- 78 yoF former smoker with COPD, complicated by chronic rhinitis, GERD,  DM FOLLOWS FOR:has to pace herself more; continues to have nervous spell(could be from DM) SOB pretty much all the time Episodic shaky spells for which she says she's had evaluation by neurology, ENT, primary physician and ER. "Electric shock sensation" from her known neuropathy. Stress makes all of this worse. Breathing has not really changed. We talked about the possibility she was having a vasomotor/autonomic neuropathy related to her diabetes  01/21/13- 77 yoF former smoker with COPD, complicated by chronic rhinitis, GERD,  DM FOLLOWS FOR:has to pace herself more; continues to have nervous spell(could be from DM) SOB pretty much all the time Has been treated for a corneal abscess with Cipro, another antibiotic for cough, and for chronic cystitis. Got blistering intertrigo which Dr. Dwyane Dee treated with Flagyl by her report. Little change in her chronic cough with clear mucus. Twinges of bilateral tussive rib pains.  07/21/13-78 yoF former smoker with COPD, complicated by chronic rhinitis, GERD,  DM FOLLOWS FOR: Stays tired all the time; was in [facility] January 2015. Has RN that comes to her home. Pt states she has chest tightness as well. Stays tired. Home visiting nurse has completed. Chest gets tight-nebulizer helps. Watery nose.  Review of Systems-see HPI Constitutional:   No-   weight loss, night sweats, fevers, chills,  +fatigue, lassitude. HEENT:   No-  headaches, difficulty swallowing, tooth/dental problems, sore throat,       No-  sneezing, itching, ear ache, nasal congestion, +post nasal drip,  CV:  + Atypical chest pains, no-orthopnea, PND, swelling in lower extremities, anasarca, dizziness,  palpitations Resp: + shortness of breath with exertion or at rest.              +  productive cough,  + non-productive cough,  No- coughing up of blood.              No-   change in color of mucus.  No- wheezing.   Skin: No-   rash or lesions. GI:  No-   heartburn, indigestion, abdominal pain, nausea, vomiting,   +diarrhea,  GU:  MS:  No-   joint pain or swelling.    + back pain. Neuro-     nothing unusual Psych:  No- change in mood or affect. + depression or anxiety.  No memory loss.    Objective:   Physical Exam Again very similar to recent examinations: General- Alert, Oriented, Affect-animated, cheerful, Distress- none acute. Talkative.  on O2 3                  L Skin- rash-none, lesions- none, excoriation- none Lymphadenopathy- none Head- atraumatic            Eyes- Gross vision intact, PERRLA, conjunctivae clear secretions            Ears- Hearing, canals-normal            Nose- Clear, no-Septal dev, mucus, polyps, erosion, perforation             Throat- Mallampati  II , mucosa clear , drainage- none, tonsils- atrophic Neck- flexible , trachea midline, no stridor , thyroid nl, carotid no bruit Chest - symmetrical excursion , unlabored           Heart/CV- RRR , no murmur , no gallop  , no rub, nl s1 s2                           - JVD- none , edema- none, stasis changes- none, varices- none           Lung- +diminished, clear, wheeze- none, cough+ dry , dullness-none, rub- none           Chest wall-  Abd-  Br/ Gen/ Rectal- Not done, not indicated Extrem- cyanosis- none, clubbing, none, atrophy- none, strength- nl Neuro- grossly intact to observation

## 2013-07-21 NOTE — Patient Instructions (Signed)
Neb xop 0.63  Order CXR

## 2013-08-01 ENCOUNTER — Telehealth: Payer: Self-pay | Admitting: Endocrinology

## 2013-08-01 NOTE — Telephone Encounter (Signed)
Pt called today she says her left butt is bigger than her right side, she said it's very painful and she's shuffling her feet a lot, she also complains of low back pain and feels very tired, She said the lady she has that comes to the house to help her said that the left side did seem larger than the right, She called her Orthopedic Dr. But he has moved to [place]. She wants to know who she should see about it?

## 2013-08-01 NOTE — Telephone Encounter (Signed)
Instructions given to patient

## 2013-08-01 NOTE — Telephone Encounter (Signed)
Go to Urgent Care to rule out an infecion

## 2013-08-01 NOTE — Telephone Encounter (Signed)
Please call pt back.

## 2013-08-06 NOTE — Assessment & Plan Note (Signed)
Plan-continue current meds. Nebulizer treatment Xopenex today to help with her coughing. CXR

## 2013-08-06 NOTE — Assessment & Plan Note (Signed)
Vasomotor rhinitis aggravated by driving from her portable oxygen. Discussed use of saline nasal spray and humidifier.

## 2013-08-11 ENCOUNTER — Telehealth: Payer: Self-pay | Admitting: Internal Medicine

## 2013-08-11 MED ORDER — PREDNISONE 10 MG PO TABS: 10.0000 mg | ORAL_TABLET | Freq: Every day | ORAL | Status: DC

## 2013-08-11 NOTE — Telephone Encounter (Signed)
Spoke with patient-she states she is not comfortable taking over 10 mg of Prednisone; I spoke with CY about this and he agrees for patient to take Prednisone 10 mg #7 take 1 po qd no refills. Pt is aware and Rx has been sent to [facility].

## 2013-08-11 NOTE — Telephone Encounter (Signed)
Does she think she could tolerate a short trial of prednisone?   If so, send pred 20 mg, # 7, 1 daily

## 2013-08-11 NOTE — Telephone Encounter (Signed)
Called spoke with patient who reported that her cough has not really improved since her last ov w/ CDY on 3.19.15 with thick clear mucus.  Pt stated that she had a severe coughing fit last night that lasted approx 56mns and was difficult for her to catch her breath.  There was some wheezing during this time.  Pt denies any more dyspnea that usual, no chest tightness currently, no hemoptysis, nausea, vomiting.  Is asking if there if something that Dr YAnnamaria Bootscan do for her cough.  Please advise, thank you.  [facility] Allergies  Allergen Reactions  . Chlordiazepoxide-Clidinium Other (See Comments)    Sleepy,weak  . Biaxin [Clarithromycin]     Foul taste, abd pain, diarrhea  . Codeine     REACTION: gi upset  . Tramadol    Current Outpatient Prescriptions on File Prior to Visit  Medication Sig Dispense Refill  . acetaminophen (TYLENOL) 500 MG tablet Take 500 mg by mouth every 6 (six) hours as needed for pain.       .Marland KitchenALPRAZolam (XANAX) 0.5 MG tablet Take 1/2 tablet to 1 tablet two times a day  60 tablet  5  . Alum & Mag Hydroxide-Simeth (MAGIC MOUTHWASH) SOLN Take 10 mLs by mouth daily as needed (as needed to help with acid reflux per Dr. DDelfin Edis.      .Marland Kitchenaspirin 325 MG tablet Take 325 mg by mouth daily.        . B-D INS SYRINGE 0.5CC/31GX5/16 31G X 5/16" 0.5 ML MISC       . dicyclomine (BENTYL) 10 MG capsule       . fenofibrate (TRICOR) 145 MG tablet Take 1 tablet (145 mg total) by mouth daily.  30 tablet  5  . glucose blood (ACCU-CHEK AVIVA PLUS) test strip Use as instructed to check blood sugars 8 times per day dx code 250.02  250 each  3  . Insulin Aspart (NOVOLOG FLEXPEN [place]) Inject into the skin. SS 8-12      . insulin glargine (LANTUS) 100 UNIT/ML injection Inject 30-45 Units into the skin See admin instructions. Take 45 units in the morning and 30 units at bedtime      . ipratropium (ATROVENT HFA) 17 MCG/ACT inhaler Inhale 2 puffs into the lungs 2 (two) times daily as needed  (shortness of breath).  1 Inhaler  11  . ketoconazole (NIZORAL) 2 % cream Apply topically daily.  60 g  1  . levalbuterol (XOPENEX HFA) 45 MCG/ACT inhaler Inhale 2 puffs into the lungs every 6 (six) hours as needed for wheezing or shortness of breath.  1 Inhaler  11  . meclizine (ANTIVERT) 25 MG tablet Take 25 mg by mouth 3 (three) times daily as needed for dizziness.       . metoprolol succinate (TOPROL-XL) 25 MG 24 hr tablet Take 1 tablet (25 mg total) by mouth daily.  30 tablet  5  . omeprazole (PRILOSEC) 20 MG capsule Take 20 mg by mouth daily.      . pioglitazone (ACTOS) 15 MG tablet Take 1 tablet (15 mg total) by mouth daily.  30 tablet  3  . predniSONE (DELTASONE) 20 MG tablet       . promethazine-codeine (PHENERGAN WITH CODEINE) 6.25-10 MG/5ML syrup Take 5 mLs by mouth every 6 (six) hours as needed for cough.  120 mL  0  . tiotropium (SPIRIVA HANDIHALER) 18 MCG inhalation capsule Place 1 capsule (18 mcg total) into inhaler and inhale daily.  30 capsule  prn  . valsartan (DIOVAN) 80 MG tablet Take 1 tablet (80 mg total) by mouth daily.  30 tablet  5   No current facility-administered medications on file prior to visit.

## 2013-08-12 DIAGNOSIS — M545 Low back pain, unspecified: Secondary | ICD-10-CM | POA: Diagnosis not present

## 2013-08-19 ENCOUNTER — Telehealth: Payer: Self-pay | Admitting: Internal Medicine

## 2013-08-19 MED ORDER — THEOPHYLLINE ER 200 MG PO TB12: 200.0000 mg | ORAL_TABLET | Freq: Two times a day (BID) | ORAL | Status: DC

## 2013-08-19 NOTE — Telephone Encounter (Signed)
Pt is aware of CY's recs. Rx has been sent in. Nothing further is needed.

## 2013-08-19 NOTE — Telephone Encounter (Signed)
Spoke with pt and reports that she stopped pred due to ran up her blood sugars.  She c/o still having cough, with no relief.  Also, states she has run out of her xopenex inhaler and that they are becoming empty very quickly when she is only taking 1 maybe 2 puffs a day and not every day.  She can not pick up rx for xopenex until the April 24 and would like to know if there's something else we can send in for her that equal to the xopenex so she will have something to relieve her sob.  She is on Cipro bid for bladder infection currently.  She is requesting further rec's for her cough that is not going away and something to replace xopenex.  CY please advise, thanks

## 2013-08-19 NOTE — Telephone Encounter (Signed)
Try theophylline 200 mg, 1 twice daily with food   # 60, refill x 5

## 2013-08-21 ENCOUNTER — Telehealth: Payer: Self-pay | Admitting: Internal Medicine

## 2013-08-21 NOTE — Telephone Encounter (Signed)
Pt is aware of CY's recs. She would like to hold off on ordering neb treatments. Will try taking 1/2 off theophylline and see how that works. She will call us back if things don't improve.

## 2013-08-21 NOTE — Telephone Encounter (Signed)
Spoke with pt. States that her cough is getting worse. Onset was 2-3 weeks ago. Cough is now dry. Reports chest congestion and SOB. Was given Theophylline 260m 1 BID earlier this week by CY. Since starting this she has been "real shakey." Wants something called in and also wants to start back on breathing treatments at home.  Allergies  Allergen Reactions  . Chlordiazepoxide-Clidinium Other (See Comments)    Sleepy,weak  . Biaxin [Clarithromycin]     Foul taste, abd pain, diarrhea  . Codeine     REACTION: gi upset  . Tramadol    Current Outpatient Prescriptions on File Prior to Visit  Medication Sig Dispense Refill  . acetaminophen (TYLENOL) 500 MG tablet Take 500 mg by mouth every 6 (six) hours as needed for pain.       .Marland KitchenALPRAZolam (XANAX) 0.5 MG tablet Take 1/2 tablet to 1 tablet two times a day  60 tablet  5  . Alum & Mag Hydroxide-Simeth (MAGIC MOUTHWASH) SOLN Take 10 mLs by mouth daily as needed (as needed to help with acid reflux per Dr. DDelfin Edis.      .Marland Kitchenaspirin 325 MG tablet Take 325 mg by mouth daily.        . B-D INS SYRINGE 0.5CC/31GX5/16 31G X 5/16" 0.5 ML MISC       . dicyclomine (BENTYL) 10 MG capsule       . fenofibrate (TRICOR) 145 MG tablet Take 1 tablet (145 mg total) by mouth daily.  30 tablet  5  . glucose blood (ACCU-CHEK AVIVA PLUS) test strip Use as instructed to check blood sugars 8 times per day dx code 250.02  250 each  3  . Insulin Aspart (NOVOLOG FLEXPEN [place]) Inject into the skin. SS 8-12      . insulin glargine (LANTUS) 100 UNIT/ML injection Inject 30-45 Units into the skin See admin instructions. Take 45 units in the morning and 30 units at bedtime      . ipratropium (ATROVENT HFA) 17 MCG/ACT inhaler Inhale 2 puffs into the lungs 2 (two) times daily as needed (shortness of breath).  1 Inhaler  11  . ketoconazole (NIZORAL) 2 % cream Apply topically daily.  60 g  1  . levalbuterol (XOPENEX HFA) 45 MCG/ACT inhaler Inhale 2 puffs into the lungs every 6  (six) hours as needed for wheezing or shortness of breath.  1 Inhaler  11  . meclizine (ANTIVERT) 25 MG tablet Take 25 mg by mouth 3 (three) times daily as needed for dizziness.       . metoprolol succinate (TOPROL-XL) 25 MG 24 hr tablet Take 1 tablet (25 mg total) by mouth daily.  30 tablet  5  . omeprazole (PRILOSEC) 20 MG capsule Take 20 mg by mouth daily.      . pioglitazone (ACTOS) 15 MG tablet Take 1 tablet (15 mg total) by mouth daily.  30 tablet  3  . predniSONE (DELTASONE) 10 MG tablet Take 1 tablet (10 mg total) by mouth daily.  7 tablet  0  . predniSONE (DELTASONE) 20 MG tablet       . promethazine-codeine (PHENERGAN WITH CODEINE) 6.25-10 MG/5ML syrup Take 5 mLs by mouth every 6 (six) hours as needed for cough.  120 mL  0  . theophylline (THEODUR) 200 MG 12 hr tablet Take 1 tablet (200 mg total) by mouth 2 (two) times daily.  60 tablet  5  . tiotropium (SPIRIVA HANDIHALER) 18 MCG inhalation capsule Place 1 capsule (18  mcg total) into inhaler and inhale daily.  30 capsule  prn  . valsartan (DIOVAN) 80 MG tablet Take 1 tablet (80 mg total) by mouth daily.  30 tablet  5   No current facility-administered medications on file prior to visit.    CY - please advise. Thanks.

## 2013-08-21 NOTE — Telephone Encounter (Signed)
If theophylline isn't helping her breathing and just makes her shakey-   She can either stop it completely now, or cut in half to take 1/2 twice daily and try it for longer.  Ok to order nebulizer compressor   For dx COPD with asthma Would like to try ordering xopenex 0.63 neb solution if a DME can get it for her with her insurance, because of he sensitivity to stimulant albuterol. Otherwise order albuterol 0.083 neb solution Either way- # 120, 1 neb every 4 hours if needed

## 2013-08-22 ENCOUNTER — Telehealth: Payer: Self-pay | Admitting: Internal Medicine

## 2013-08-22 MED ORDER — AMOXICILLIN-POT CLAVULANATE 500-125 MG PO TABS: 1.0000 | ORAL_TABLET | Freq: Two times a day (BID) | ORAL | Status: DC

## 2013-08-22 NOTE — Telephone Encounter (Signed)
Pt aware of recs. RX called in. Nothing further needed 

## 2013-08-22 NOTE — Telephone Encounter (Signed)
Called and spoke with pt and she stated that she is not any better.  She stated that she did start on plain mucinex.  She stated that when she can cough up sputum it is dark green and she started with a severe cough last night.  She stated that most of the time she cannot move any of the congestion that is in her chest.  She is having some increase in SOB and wheezing at times.  She has a hard time catching her breath sometimes and this scares her.  Pt stated that she has 2 pills left of cipro from a UTI.  Not sure if CY wants her to start on abx.  CY please advise. Thanks  Last ov--03.16/2015 Next ov--11/24/2013  Allergies  Allergen Reactions  . Chlordiazepoxide-Clidinium Other (See Comments)    Sleepy,weak  . Biaxin [Clarithromycin]     Foul taste, abd pain, diarrhea  . Codeine     REACTION: gi upset  . Tramadol      Current Outpatient Prescriptions on File Prior to Visit  Medication Sig Dispense Refill  . acetaminophen (TYLENOL) 500 MG tablet Take 500 mg by mouth every 6 (six) hours as needed for pain.       Marland Kitchen ALPRAZolam (XANAX) 0.5 MG tablet Take 1/2 tablet to 1 tablet two times a day  60 tablet  5  . Alum & Mag Hydroxide-Simeth (MAGIC MOUTHWASH) SOLN Take 10 mLs by mouth daily as needed (as needed to help with acid reflux per Dr. Delfin Edis).      Marland Kitchen aspirin 325 MG tablet Take 325 mg by mouth daily.        . B-D INS SYRINGE 0.5CC/31GX5/16 31G X 5/16" 0.5 ML MISC       . dicyclomine (BENTYL) 10 MG capsule       . fenofibrate (TRICOR) 145 MG tablet Take 1 tablet (145 mg total) by mouth daily.  30 tablet  5  . glucose blood (ACCU-CHEK AVIVA PLUS) test strip Use as instructed to check blood sugars 8 times per day dx code 250.02  250 each  3  . Insulin Aspart (NOVOLOG FLEXPEN [place]) Inject into the skin. SS 8-12      . insulin glargine (LANTUS) 100 UNIT/ML injection Inject 30-45 Units into the skin See admin instructions. Take 45 units in the morning and 30 units at bedtime      .  ipratropium (ATROVENT HFA) 17 MCG/ACT inhaler Inhale 2 puffs into the lungs 2 (two) times daily as needed (shortness of breath).  1 Inhaler  11  . ketoconazole (NIZORAL) 2 % cream Apply topically daily.  60 g  1  . levalbuterol (XOPENEX HFA) 45 MCG/ACT inhaler Inhale 2 puffs into the lungs every 6 (six) hours as needed for wheezing or shortness of breath.  1 Inhaler  11  . meclizine (ANTIVERT) 25 MG tablet Take 25 mg by mouth 3 (three) times daily as needed for dizziness.       . metoprolol succinate (TOPROL-XL) 25 MG 24 hr tablet Take 1 tablet (25 mg total) by mouth daily.  30 tablet  5  . omeprazole (PRILOSEC) 20 MG capsule Take 20 mg by mouth daily.      . pioglitazone (ACTOS) 15 MG tablet Take 1 tablet (15 mg total) by mouth daily.  30 tablet  3  . predniSONE (DELTASONE) 10 MG tablet Take 1 tablet (10 mg total) by mouth daily.  7 tablet  0  . predniSONE (DELTASONE) 20 MG  tablet       . promethazine-codeine (PHENERGAN WITH CODEINE) 6.25-10 MG/5ML syrup Take 5 mLs by mouth every 6 (six) hours as needed for cough.  120 mL  0  . theophylline (THEODUR) 200 MG 12 hr tablet Take 1 tablet (200 mg total) by mouth 2 (two) times daily.  60 tablet  5  . tiotropium (SPIRIVA HANDIHALER) 18 MCG inhalation capsule Place 1 capsule (18 mcg total) into inhaler and inhale daily.  30 capsule  prn  . valsartan (DIOVAN) 80 MG tablet Take 1 tablet (80 mg total) by mouth daily.  30 tablet  5   No current facility-administered medications on file prior to visit.

## 2013-08-22 NOTE — Telephone Encounter (Signed)
I suggest we reserve Cipro for now. Offer script augmentin 500 mg, # 20, 1 twice daily

## 2013-08-29 DIAGNOSIS — E119 Type 2 diabetes mellitus without complications: Secondary | ICD-10-CM | POA: Diagnosis not present

## 2013-08-29 DIAGNOSIS — M129 Arthropathy, unspecified: Secondary | ICD-10-CM | POA: Diagnosis not present

## 2013-08-29 DIAGNOSIS — E785 Hyperlipidemia, unspecified: Secondary | ICD-10-CM | POA: Diagnosis not present

## 2013-08-29 DIAGNOSIS — R42 Dizziness and giddiness: Secondary | ICD-10-CM | POA: Diagnosis not present

## 2013-08-29 DIAGNOSIS — J449 Chronic obstructive pulmonary disease, unspecified: Secondary | ICD-10-CM | POA: Diagnosis not present

## 2013-09-01 ENCOUNTER — Other Ambulatory Visit: Payer: Self-pay | Admitting: *Deleted

## 2013-09-01 ENCOUNTER — Telehealth: Payer: Self-pay | Admitting: Internal Medicine

## 2013-09-01 MED ORDER — MAGIC MOUTHWASH: ORAL | Status: DC

## 2013-09-01 MED ORDER — INSULIN GLARGINE 100 UNIT/ML ~~LOC~~ SOLN: 30.0000 [IU] | SUBCUTANEOUS | Status: DC

## 2013-09-01 MED ORDER — MECLIZINE HCL 25 MG PO TABS: 25.0000 mg | ORAL_TABLET | Freq: Three times a day (TID) | ORAL | Status: DC | PRN

## 2013-09-01 NOTE — Telephone Encounter (Signed)
Called, spoke with pt.  On 08/22/13, augmentin rx was called in for pt.  Pt reports she is "some better" with augmentin but is "still sick as a [place]."  Pt believes she may "have or had pna."  States she will take last abx today.  C/o prod cough with green mucus, nasal congestion with green mucus, and "feels so weak."  Reports she is "coughing so hard I thought I was going to need to go to the hospital."  Was using promethazine codeine cough syrup but is now out.  Also, reports increased SOB and chest tightness/aching.  No fever.  Uses xopenex hfa with some relief of chest tightness.  Stopped mucinex yesterday because she didn't feel it was helping. Requesting further recs -- is unable to come in today.  States she may be able to come in tomorrow early morning if needed.  Dr. Annamaria Boots, pls advise.  Thank you.  [facility]  Allergies: Allergies  Allergen Reactions  . Chlordiazepoxide-Clidinium Other (See Comments)    Sleepy,weak  . Biaxin [Clarithromycin]     Foul taste, abd pain, diarrhea  . Codeine     REACTION: gi upset  . Tramadol       Current Outpatient Prescriptions on File Prior to Visit  Medication Sig Dispense Refill  . acetaminophen (TYLENOL) 500 MG tablet Take 500 mg by mouth every 6 (six) hours as needed for pain.       Marland Kitchen ALPRAZolam (XANAX) 0.5 MG tablet Take 1/2 tablet to 1 tablet two times a day  60 tablet  5  . Alum & Mag Hydroxide-Simeth (MAGIC MOUTHWASH) SOLN Take 10 mLs by mouth daily as needed (as needed to help with acid reflux per Dr. Delfin Edis).      Marland Kitchen amoxicillin-clavulanate (AUGMENTIN) 500-125 MG per tablet Take 1 tablet (500 mg total) by mouth 2 (two) times daily.  20 tablet  0  . aspirin 325 MG tablet Take 325 mg by mouth daily.        . B-D INS SYRINGE 0.5CC/31GX5/16 31G X 5/16" 0.5 ML MISC       . dicyclomine (BENTYL) 10 MG capsule       . fenofibrate (TRICOR) 145 MG tablet Take 1 tablet (145 mg total) by mouth daily.  30 tablet  5  . glucose blood  (ACCU-CHEK AVIVA PLUS) test strip Use as instructed to check blood sugars 8 times per day dx code 250.02  250 each  3  . Insulin Aspart (NOVOLOG FLEXPEN [place]) Inject into the skin. SS 8-12      . insulin glargine (LANTUS) 100 UNIT/ML injection Inject 30-45 Units into the skin See admin instructions. Take 45 units in the morning and 30 units at bedtime      . ipratropium (ATROVENT HFA) 17 MCG/ACT inhaler Inhale 2 puffs into the lungs 2 (two) times daily as needed (shortness of breath).  1 Inhaler  11  . ketoconazole (NIZORAL) 2 % cream Apply topically daily.  60 g  1  . levalbuterol (XOPENEX HFA) 45 MCG/ACT inhaler Inhale 2 puffs into the lungs every 6 (six) hours as needed for wheezing or shortness of breath.  1 Inhaler  11  . meclizine (ANTIVERT) 25 MG tablet Take 25 mg by mouth 3 (three) times daily as needed for dizziness.       . metoprolol succinate (TOPROL-XL) 25 MG 24 hr tablet Take 1 tablet (25 mg total) by mouth daily.  30 tablet  5  . omeprazole (PRILOSEC) 20  MG capsule Take 20 mg by mouth daily.      . pioglitazone (ACTOS) 15 MG tablet Take 1 tablet (15 mg total) by mouth daily.  30 tablet  3  . predniSONE (DELTASONE) 10 MG tablet Take 1 tablet (10 mg total) by mouth daily.  7 tablet  0  . predniSONE (DELTASONE) 20 MG tablet       . promethazine-codeine (PHENERGAN WITH CODEINE) 6.25-10 MG/5ML syrup Take 5 mLs by mouth every 6 (six) hours as needed for cough.  120 mL  0  . theophylline (THEODUR) 200 MG 12 hr tablet Take 1 tablet (200 mg total) by mouth 2 (two) times daily.  60 tablet  5  . tiotropium (SPIRIVA HANDIHALER) 18 MCG inhalation capsule Place 1 capsule (18 mcg total) into inhaler and inhale daily.  30 capsule  prn  . valsartan (DIOVAN) 80 MG tablet Take 1 tablet (80 mg total) by mouth daily.  30 tablet  5   No current facility-administered medications on file prior to visit.

## 2013-09-01 NOTE — Telephone Encounter (Signed)
See if she can come in tomorrow with either me or TP, with CXR and CBC/diff

## 2013-09-01 NOTE — Telephone Encounter (Signed)
Spoke with the pt and scheduled appt with TP for 11:30 tomorrow

## 2013-09-02 ENCOUNTER — Ambulatory Visit (INDEPENDENT_AMBULATORY_CARE_PROVIDER_SITE_OTHER): Payer: Medicare Other | Admitting: Adult Health

## 2013-09-02 ENCOUNTER — Encounter: Payer: Self-pay | Admitting: Adult Health

## 2013-09-02 VITALS — BP 126/58 | HR 87 | Temp 97.9°F | Ht 63.0 in | Wt 200.6 lb

## 2013-09-02 DIAGNOSIS — J449 Chronic obstructive pulmonary disease, unspecified: Secondary | ICD-10-CM | POA: Diagnosis not present

## 2013-09-02 MED ORDER — AMOXICILLIN-POT CLAVULANATE 875-125 MG PO TABS: 1.0000 | ORAL_TABLET | Freq: Two times a day (BID) | ORAL | Status: AC

## 2013-09-02 MED ORDER — PREDNISONE 10 MG PO TABS: ORAL_TABLET | ORAL | Status: DC

## 2013-09-02 MED ORDER — PROMETHAZINE-CODEINE 6.25-10 MG/5ML PO SYRP: 5.0000 mL | ORAL_SOLUTION | Freq: Four times a day (QID) | ORAL | Status: DC | PRN

## 2013-09-02 NOTE — Assessment & Plan Note (Signed)
Slow to resolve   Plan  Extend Augmentin 3 days -take w/ food . Eat yogurt.  Mucinex DM Twice daily  As needed  Cough/congestion  Prednisone taper over next week. -take w/ food, may make your blood sugars go up , call if >250.  Phenergan w/ codeine 1-2 tsp every 6hr As needed  Cough -may make you sleepy.  Please contact office for sooner follow up if symptoms do not improve or worsen or seek emergency care  follow up Dr. Annamaria Boots  As planned and As needed

## 2013-09-02 NOTE — Patient Instructions (Signed)
Extend Augmentin 3 days -take w/ food . Eat yogurt.  Mucinex DM Twice daily  As needed  Cough/congestion  Prednisone taper over next week. -take w/ food, may make your blood sugars go up , call if >250.  Phenergan w/ codeine 1-2 tsp every 6hr As needed  Cough -may make you sleepy.  Please contact office for sooner follow up if symptoms do not improve or worsen or seek emergency care  follow up Dr. Annamaria Boots  As planned and As needed

## 2013-09-02 NOTE — Progress Notes (Signed)
Patient ID: [name], female    DOB: October 06, 1935, 78 y.o.   MRN: [id number]  HPI SATURATION QUALIFICATIONS: Patient Saturations on Room Air while Ambulating = 84% Patient Saturations on 2.5 Liters of oxygen while Ambulating = 92%[name],CMA  09/28/10-  75 yoF former smoker with COPD, complicated by chronic rhinitis, GERD. Last here 04/29/10- note reviewed. Acute visit.-- Woke with soreness across under her breasts. Losing voice. Coughing spells, with much liquid coming up and from nose. Prior to that hadn't been coughing. C/o weak, dizzy, light headed- relieved by her oxygen. . Moving into a smaller apartment. Denies fever. Easy DOE short walks room to room.   10/31/10- 64 yoF former smoker with COPD, complicated by chronic rhinitis, GERD. She denies major change since last here. Has to stay in to avoid heat and on her oxygen much of the time in the house. Coughs scant clear or lemon yellow, with some hoarseness which comes and goes. She describes hoarseness as worse with phone conversations, variable but rarely if ever clear. She had seen Dr [name] years ago for epistaxis. Easy choke with eating. Discussed aspiration precautions, chin tuck.   02/26/11- 26 yoF former smoker with COPD, complicated by chronic rhinitis, GERD. Has had flu vaccine. No coughing through the summer. We first cool days as she stepped outside she would begin to cough again. Dry hacking cough. Very aware of active reflux anytime she bends over, regurgitating recently swallowed food. Has also had some diarrhea. Complains of spasms intermittently left lower anterior chest and left upper quadrant. It may all be GI. She thinks it is part of her diabetic neuropathy. Has appointment soon with Dr.Brodie.  08/16/11-75 yoF former smoker with COPD, complicated by chronic rhinitis, GERD. During the winter had prolonged diarrhea evaluated by Dr [name] and apparently attributed to her diabetic neuropathy. May have had  esophagitis-was told to take Magic mouthwash. Gradually easier dyspnea on exertion. Now on oxygen 2-3 L most of the time. She came today with acute complaint of aching pain intermittently for the past several weeks, dull. Associated with mid back at strap level. Pain is gone right now. She saw no effect of activity or position and said pain was nonpleuritic or radiating.  CXR- 08/16/11- reviewed w/ her. NAD with no acute process.   08/28/11-75 yoF former smoker with COPD, complicated by chronic rhinitis, GERD,  DM. Back pain comes and goes. She implies that it is better today and says that she really has been present a long time as part of her degenerative back pain. Complains of feeling sluggish and tired. Recent bronchitis with productive green sputum watery nose no fever. Using Atrovent. Intolerant of albuterol and Symbicort because of overstimulation. Home oxygen is 2-1/2-3 L. CXR 08/15/11-  IMPRESSION:  Hyperinflation.  No active cardiopulmonary disease.  Original Report Authenticated By: [name], M.D.   12/28/11- 15 yoF former smoker with COPD, complicated by chronic rhinitis, GERD,  DM. Not doing well-stays on O2 all the time; since being on Spiriva at first did great but has noticed having to stop more and rest during the day(nap) She  stays indoors most of the time without many friends to do things with. She often has self-limited sharp twinges right anterior chest wall and left infrascapular back. With her known degenerative disc disease, we discussed the possibility this was nerve root irritation. Spiriva helps variable cough.  04/23/12- 38 yoF former smoker with COPD, complicated by chronic rhinitis, GERD,  DM. FOLLOWS FOR: still  has good and bad days with SOB and activity levels Watery rhinorrhea. No acute problems. No respiratory problems associated with surgery for nerve impingement in her right hand. She continues oxygen 3 or 3 a half liters/Advanced PFT: 01/16/2012 severe  obstructive airways disease with insignificant response to bronchodilator, air trapping, diffusion moderately reduced. FEV1 0.82/45%, FEV1/FEC 0.46. Emphysema pattern on the loop. TLC 100%, RV 148%, DLCO 47%.  10/22/12- 26 yoF former smoker with COPD, complicated by chronic rhinitis, GERD,  DM FOLLOWS FOR:has to pace herself more; continues to have nervous spell(could be from DM) SOB pretty much all the time Episodic shaky spells for which she says she's had evaluation by neurology, ENT, primary physician and ER. "Electric shock sensation" from her known neuropathy. Stress makes all of this worse. Breathing has not really changed. We talked about the possibility she was having a vasomotor/autonomic neuropathy related to her diabetes  01/21/13- 77 yoF former smoker with COPD, complicated by chronic rhinitis, GERD,  DM FOLLOWS FOR:has to pace herself more; continues to have nervous spell(could be from DM) SOB pretty much all the time Has been treated for a corneal abscess with Cipro, another antibiotic for cough, and for chronic cystitis. Got blistering intertrigo which Dr. Dwyane Dee treated with Flagyl by her report. Little change in her chronic cough with clear mucus. Twinges of bilateral tussive rib pains.  07/21/13-78 yoF former smoker with COPD, complicated by chronic rhinitis, GERD,  DM FOLLOWS FOR: Stays tired all the time; was in [facility] 2015. Has RN that comes to her home. Pt states she has chest tightness as well. Stays tired. Home visiting nurse has completed. Chest gets tight-nebulizer helps. Watery nose.  09/02/2013 Acute OV  Complains of 2 weeks of cough and congestion . cough still going on-lt. green,occass. wheezing,chest tightness no fcs Was called in augmentin on 4/17, finished last dose yesterday. Mucus is some better but still coughing and wheezing . Cough is keeping her up at night.  Patient denies any this is chest pain, orthopnea, PND, or leg swelling CXR last ov showed  chronic changes with COPD 07/21/13   Review of Systems-see HPI  Constitutional:   No  weight loss, night sweats,  Fevers, chills,  +fatigue, or  lassitude.  HEENT:   No headaches,  Difficulty swallowing,  Tooth/dental problems, or  Sore throat,                No sneezing, itching, ear ache,  +nasal congestion, post nasal drip,   CV:  No chest pain,  Orthopnea, PND, swelling in lower extremities, anasarca, dizziness, palpitations, syncope.   GI  No heartburn, indigestion, abdominal pain, nausea, vomiting, diarrhea, change in bowel habits, loss of appetite, bloody stools.   Resp:  .  No chest wall deformity  Skin: no rash or lesions.  GU: no dysuria, change in color of urine, no urgency or frequency.  No flank pain, no hematuria   MS:  No joint pain or swelling.  No decreased range of motion.  No back pain.  Psych:  No change in mood or affect. No depression or anxiety.  No memory loss.         Objective:   Physical Exam GEN: A/Ox3; pleasant , NAD, elderly ,chronically ill appearing   HEENT:  [place]/AT,  EACs-clear, TMs-wnl, NOSE-clear, THROAT-clear, no lesions, no postnasal drip or exudate noted.   NECK:  Supple w/ fair ROM; no JVD; normal carotid impulses w/o bruits; no thyromegaly or nodules palpated; no lymphadenopathy.  RESP  Few rhonchi , no accessory muscle use, no dullness to percussion  CARD:  RRR, no m/r/g  , no peripheral edema, pulses intact, no cyanosis or clubbing.  GI:   Soft & nt; nml bowel sounds; no organomegaly or masses detected.  Musco: Warm bil, no deformities or joint swelling noted.   Neuro: alert, no focal deficits noted.    Skin: Warm, no lesions or rashes

## 2013-09-15 ENCOUNTER — Ambulatory Visit (INDEPENDENT_AMBULATORY_CARE_PROVIDER_SITE_OTHER): Payer: Medicare Other | Admitting: Endocrinology

## 2013-09-15 ENCOUNTER — Other Ambulatory Visit (INDEPENDENT_AMBULATORY_CARE_PROVIDER_SITE_OTHER): Payer: Medicare Other

## 2013-09-15 ENCOUNTER — Encounter: Payer: Self-pay | Admitting: Endocrinology

## 2013-09-15 VITALS — BP 128/62 | HR 88 | Temp 97.9°F | Resp 16 | Ht 63.0 in | Wt 201.2 lb

## 2013-09-15 DIAGNOSIS — IMO0001 Reserved for inherently not codable concepts without codable children: Secondary | ICD-10-CM

## 2013-09-15 DIAGNOSIS — R5383 Other fatigue: Secondary | ICD-10-CM | POA: Diagnosis not present

## 2013-09-15 DIAGNOSIS — E1149 Type 2 diabetes mellitus with other diabetic neurological complication: Secondary | ICD-10-CM

## 2013-09-15 DIAGNOSIS — R5381 Other malaise: Secondary | ICD-10-CM

## 2013-09-15 DIAGNOSIS — E1165 Type 2 diabetes mellitus with hyperglycemia: Principal | ICD-10-CM

## 2013-09-15 DIAGNOSIS — R42 Dizziness and giddiness: Secondary | ICD-10-CM

## 2013-09-15 DIAGNOSIS — E785 Hyperlipidemia, unspecified: Secondary | ICD-10-CM

## 2013-09-15 DIAGNOSIS — M899 Disorder of bone, unspecified: Secondary | ICD-10-CM | POA: Diagnosis not present

## 2013-09-15 DIAGNOSIS — M898X9 Other specified disorders of bone, unspecified site: Secondary | ICD-10-CM

## 2013-09-15 DIAGNOSIS — M949 Disorder of cartilage, unspecified: Secondary | ICD-10-CM

## 2013-09-15 LAB — COMPREHENSIVE METABOLIC PANEL
ALBUMIN: 3.7 g/dL (ref 3.5–5.2)
ALK PHOS: 33 U/L — AB (ref 39–117)
ALT: 14 U/L (ref 0–35)
AST: 19 U/L (ref 0–37)
BILIRUBIN TOTAL: 0.3 mg/dL (ref 0.2–1.2)
BUN: 14 mg/dL (ref 6–23)
CO2: 34 mEq/L — ABNORMAL HIGH (ref 19–32)
Calcium: 9.4 mg/dL (ref 8.4–10.5)
Chloride: 99 mEq/L (ref 96–112)
Creatinine, Ser: 0.6 mg/dL (ref 0.4–1.2)
GFR: 103.24 mL/min (ref >60.00)
GLUCOSE: 125 mg/dL — AB (ref 70–99)
POTASSIUM: 4.3 meq/L (ref 3.5–5.1)
SODIUM: 139 meq/L (ref 135–145)
Total Protein: 7.1 g/dL (ref 6.0–8.3)

## 2013-09-15 LAB — LIPID PANEL
CHOL/HDL RATIO: 4
CHOLESTEROL: 182 mg/dL (ref 0–200)
HDL: 42.2 mg/dL (ref >39.00)
LDL CALC: 100 mg/dL — AB (ref 0–99)
Triglycerides: 199 mg/dL — ABNORMAL HIGH (ref 0.0–149.0)
VLDL: 39.8 mg/dL (ref 0.0–40.0)

## 2013-09-15 LAB — TSH: TSH: 0.55 u[IU]/mL (ref 0.35–4.50)

## 2013-09-15 LAB — MICROALBUMIN / CREATININE URINE RATIO
Creatinine,U: 74.2 mg/dL
MICROALB/CREAT RATIO: 1.5 mg/g (ref 0.0–30.0)
Microalb, Ur: 1.1 mg/dL (ref 0.0–1.9)

## 2013-09-15 LAB — HEMOGLOBIN A1C: Hgb A1c MFr Bld: 9 % — ABNORMAL HIGH (ref 4.6–6.5)

## 2013-09-15 LAB — T4, FREE: Free T4: 0.69 ng/dL (ref 0.60–1.60)

## 2013-09-15 NOTE — Progress Notes (Signed)
Patient ID: [name], female   DOB: [birth year], 78 y.o.   MRN: [id number]   Reason for Appointment: follow-up of multiple problems  History of Present Illness    DIABETES since 1980:   She has been on insulin using Lantus and Humalog for several years and usually A1c around 8% despite fairly good compliance with diet, exercise and glucose monitoring. She also had been on Actos previously which probably benefited her especially with her fatty liver but this was stopped because of fear of side effects and she has not resumed this.      Recent history: The patient's diabetes control appears to be overall  About the same blood sugars at home averaging 184 Her A1c is usually consistently around 9% She tends to have higher readings with intercurrent illnesses, steroids and has had significant variability in her blood sugars She is living alone and is generally afraid of hypoglycemia with using significant amounts of insulin Periodically has had her basal insulin adjusted She does have several readings over 200 and blood sugars are mostly higher around supper time and also midmorning Fasting readings have been overall the lowest and recently near normal  Insulin regimen: Lantus twice a day, 43--26 with syringe and Humalog 8-10 a.c. 3 times a day    Oral hypoglycemic drugs: None        Side effects from medications: None         Proper timing of medications in relation to meals: Yes.         Monitors blood glucose:  4-5 times a day   Glucometer:  Accu-Chek         Blood Glucose readings recently from meter download:  PREMEAL Breakfast Lunch Dinner Bedtime Overall  Glucose range:  62-218   117-202   121-237   95-326   62-339   Average:  149   168   195   207  184   POST-MEAL PC Breakfast PC Lunch PC Dinner  Glucose range:  124-233     Mean/median:      Hypoglycemia:  very occasional with only one low reading of 62 on 5/8        Meals: 3 meals per day. Supper 6 to 7 PM     Physical  activity: exercise: Unable to do much because of dyspnea           Dietician visit: Most recent: Several years ago           Complications: are: Peripheral neuropathy     Wt Readings from Last 3 Encounters:  09/15/13 201 lb 3.2 oz (91.264 kg)  09/02/13 200 lb 9.6 oz (90.992 kg)  07/21/13 203 lb 3.2 oz (92.171 kg)    Lab Results  Component Value Date   HGBA1C 9.0* 09/15/2013   HGBA1C 9.0* 05/11/2013   HGBA1C 8.8* 02/12/2013   Lab Results  Component Value Date   MICROALBUR 1.1 09/15/2013   LDLCALC 100* 09/15/2013   CREATININE 0.6 09/15/2013       Medication List       This list is accurate as of: 09/15/13 11:59 PM.  Always use your most recent med list.               acetaminophen 500 MG tablet  Commonly known as:  TYLENOL  Take 500 mg by mouth every 6 (six) hours as needed for pain.     ALPRAZolam 0.5 MG tablet  Commonly known as:  XANAX  Take 1/2 tablet to  1 tablet two times a day     amoxicillin-clavulanate 500-125 MG per tablet  Commonly known as:  AUGMENTIN  Take 1 tablet (500 mg total) by mouth 2 (two) times daily.     aspirin 325 MG tablet  Take 325 mg by mouth daily.     B-D INS SYRINGE 0.5CC/31GX5/16 31G X 5/16" 0.5 ML Misc  Generic drug:  Insulin Syringe-Needle U-100     dicyclomine 10 MG capsule  Commonly known as:  BENTYL     fenofibrate 145 MG tablet  Commonly known as:  TRICOR  Take 1 tablet (145 mg total) by mouth daily.     glucose blood test strip  Commonly known as:  ACCU-CHEK AVIVA PLUS  Use as instructed to check blood sugars 8 times per day dx code 250.02     insulin glargine 100 UNIT/ML injection  Commonly known as:  LANTUS  Inject 0.3-0.45 mLs (30-45 Units total) into the skin See admin instructions. Take 45 units in the morning and 30 units at bedtime     ipratropium 17 MCG/ACT inhaler  Commonly known as:  ATROVENT HFA  Inhale 2 puffs into the lungs 2 (two) times daily as needed (shortness of breath).     ketoconazole 2 % cream   Commonly known as:  NIZORAL  Apply topically daily.     levalbuterol 45 MCG/ACT inhaler  Commonly known as:  XOPENEX HFA  Inhale 2 puffs into the lungs every 6 (six) hours as needed for wheezing or shortness of breath.     magic mouthwash Soln  Swish with 2 teaspoonfuls by mouth as needed     meclizine 25 MG tablet  Commonly known as:  ANTIVERT  Take 1 tablet (25 mg total) by mouth 3 (three) times daily as needed for dizziness.     metoprolol succinate 25 MG 24 hr tablet  Commonly known as:  TOPROL-XL  Take 1 tablet (25 mg total) by mouth daily.     NOVOLOG FLEXPEN [place]  Inject into the skin. SS 8-12     omeprazole 20 MG capsule  Commonly known as:  PRILOSEC  Take 20 mg by mouth daily.     pioglitazone 15 MG tablet  Commonly known as:  ACTOS  Take 1 tablet (15 mg total) by mouth daily.     predniSONE 20 MG tablet  Commonly known as:  DELTASONE     predniSONE 10 MG tablet  Commonly known as:  DELTASONE  Take 1 tablet (10 mg total) by mouth daily.     predniSONE 10 MG tablet  Commonly known as:  DELTASONE  4 tabs for 2 days, then 2 tabs for 2 days, then 1 tab for 2 days, then stop     promethazine-codeine 6.25-10 MG/5ML syrup  Commonly known as:  PHENERGAN with CODEINE  Take 5 mLs by mouth every 6 (six) hours as needed for cough.     promethazine-codeine 6.25-10 MG/5ML syrup  Commonly known as:  PHENERGAN with CODEINE  Take 5 mLs by mouth every 6 (six) hours as needed for cough.     theophylline 200 MG 12 hr tablet  Commonly known as:  THEODUR  Take 1 tablet (200 mg total) by mouth 2 (two) times daily.     tiotropium 18 MCG inhalation capsule  Commonly known as:  SPIRIVA HANDIHALER  Place 1 capsule (18 mcg total) into inhaler and inhale daily.     valsartan 80 MG tablet  Commonly known as:  DIOVAN  Take  1 tablet (80 mg total) by mouth daily.        Allergies:  Allergies  Allergen Reactions  . Chlordiazepoxide-Clidinium Other (See Comments)     Sleepy,weak  . Biaxin [Clarithromycin]     Foul taste, abd pain, diarrhea  . Codeine     REACTION: gi upset  . Tramadol     Past Medical History  Diagnosis Date  . Irritable bowel syndrome   . Other chronic nonalcoholic liver disease   . Esophageal reflux   . Allergic rhinitis, cause unspecified     Sinus CT Rec 12-23-2009  . 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 Sciatica   . Shingles 2010  . Steatohepatitis   . Diabetes mellitus   . Diverticulosis   . Hiatal hernia   . Esophageal stricture   . Diarrhea   . GERD (gastroesophageal reflux disease)   . Shortness of breath   . Hypertension   . Arthritis   . Glaucoma   . Complication of anesthesia     takes along time to wake up  . Oxygen deficiency   . Benign paroxysmal positional vertigo 08/09/2012  . Neuropathy in diabetes 08/09/2012    Past Surgical History  Procedure Laterality Date  . Cholecystectomy open  1978  . Liver biopsy  08-1992  . Parathyroid exploration    . Tonsillectomy and adenoidectomy    . Total abdominal hysterectomy    . Esophagogastroduodenoscopy  1740,81-44    H Hernia,es.stricture s/p dil 22F  . Colonoscopy  07-2001    mild diverticulosis  . Carpal tunnel release      right hand  . Cataract extraction      Bilateral  . Ulnar nerve transposition  12/07/2011    Procedure: ULNAR NERVE DECOMPRESSION/TRANSPOSITION;this was cancelled-not done  Surgeon: Cammie Sickle., MD;  Location: [place];  Service: Orthopedics;  Laterality: Right;  right ulnar nerve in situ decompression  . Ulnar tunnel release  03/07/2012    Procedure: CUBITAL TUNNEL RELEASE;  Surgeon: Roseanne Kaufman, MD;  Location: [facility];  Service: Orthopedics;  Laterality: Right;  ulnar nerve release at the elbow      Family History  Problem Relation Age of Onset  . Lung cancer Mother     small cell;Byssinosis  . Heart disease Father   . Lung  cancer Sister   . Liver cancer Sister     ? mets from another area of the body  . Diabetes      grandmother  . Stroke Maternal Grandfather     Social History:  reports that she quit smoking about 25 years ago. Her smoking use included Cigarettes. She smoked 0.00 packs per day. She has never used smokeless tobacco. She reports that she does not drink alcohol or use illicit drugs.  Review of Systems:  She has been told by her orthopedic surgeon that she had an abnormal x-ray of her "hip" and continues to have low back pain. Also has pain in the finger joints.  HYPERTENSION:  130/70 blood pressure is well-controlled now. No lightheadedness on standing up. Blood pressure has been variable at home. She is forgetting to take her Diovan in the evening and wants to try morning dosage  HYPERLIPIDEMIA: The lipid abnormality consists of elevated LDL previously treated with pravastatin which she had no side effects from. She tried this again and had pain in the right arm  muscle and stopped  it. Since her triglycerides were relatively higher and LDL not significantly high she was told to try fenofibrate but again she has not taken this  Lab Results  Component Value Date   CHOL 182 09/15/2013   HDL 42.20 09/15/2013   LDLCALC 100* 09/15/2013   LDLDIRECT 149.0 02/12/2013   TRIG 199.0* 09/15/2013   CHOLHDL 4 09/15/2013    She is followed by pulmonologist for chronic COPD, taking home oxygen and still having dyspnea  Dizziness- during her hospitalization  possible BPPV was diagnosed.  Vestibular PT was done which was successful She has not had any further sudden episodes of vertigo but does feel off-balance and she calls this dizziness also.  Also when she stands up she has a tendency to sway including backwards    Continues to complain about lack of energy .  Lab Results  Component Value Date   TSH 0.55 09/15/2013   Diarrhea: She has had long-standing intermittent diarrhea. Is taking Questran as  needed    Examination:   BP 128/62  Pulse 88  Temp(Src) 97.9 F (36.6 C)  Resp 16  Ht 5' 3"  (1.6 m)  Wt 201 lb 3.2 oz (91.264 kg)  BMI 35.65 kg/m2  SpO2 93%  Body mass index is 35.65 kg/(m^2).   No ankle edema  ASSESSMENT/ PLAN:   Diabetes type 2   The patient's diabetes control appears to be fair with periods of hyperglycemia especially towards the evenings See history of present illness for current analysis of her blood sugar patterns and current management Because of potential for hypoglycemia she does not like to take relatively larger doses of insulin especially for correction She is fairly compliant with checking her blood sugars up to 6 times a day Continues to be somewhat insulin resistant and also has higher readings when she has intercurrent illnesses and steroids for her COPD Insulin changes: Increase morning Lantus by 2 units and reduce evening dose to 22  Lantus  HYPERTENSION: Well-controlled, to continue same regimen  Fatigue/weakness: This is secondary to multiple medical problems and chronic hypoxia  Hyperlipidemia: She will continue fenofibrate as triglycerides have been high and she cannot take statin drugs  Joint and back pain as well as abnormal x-rays of hip/pelvis: Will get referral to rheumatology for workup  Counseling time over 50% of today's 25 minute visit  [name] 09/16/2013, 9:02 AM

## 2013-09-15 NOTE — Patient Instructions (Addendum)
Lantus 45 in am and 22 in pm  Take Fenofibrate daily

## 2013-09-16 ENCOUNTER — Telehealth: Payer: Self-pay | Admitting: *Deleted

## 2013-09-17 ENCOUNTER — Telehealth: Payer: Self-pay | Admitting: Internal Medicine

## 2013-09-17 MED ORDER — PROMETHAZINE-CODEINE 6.25-10 MG/5ML PO SYRP: 5.0000 mL | ORAL_SOLUTION | Freq: Four times a day (QID) | ORAL | Status: DC | PRN

## 2013-09-17 NOTE — Telephone Encounter (Signed)
Patient Instructions     Extend Augmentin 3 days -take w/ food . Eat yogurt.  Mucinex DM Twice daily As needed Cough/congestion  Prednisone taper over next week. -take w/ food, may make your blood sugars go up , call if >250.  Phenergan w/ codeine 1-2 tsp every 6hr As needed Cough -may make you sleepy.  Please contact office for sooner follow up if symptoms do not improve or worsen or seek emergency care  follow up Dr. Annamaria Boots As planned and As needed    Was seen by [facility] last-Pt states her cough started back again and is keeping her up at night. Pt states that she never got the phenergan with codeine Rx from [facility]-was never called to pharmacy. Pt would like Rx from [facility] to be called to her pharmacy on file.   [facility], please advise if okay to call to pharmacy and QTY to give. Thanks.

## 2013-09-17 NOTE — Telephone Encounter (Signed)
Offer Robitusin AC, which we are told is the cheapest    200 ml, 1 teaspoon every 6 hours if needed for cough

## 2013-09-17 NOTE — Telephone Encounter (Signed)
Pt is aware of [facility]'s recs. Refuses to have Robitussin AC. States "I'm scared to death to take Robitussin."  Per [facility] --- okay to refill Promethazine-Codeine. Rx has been signed and placed up front for pick up.

## 2013-09-18 DIAGNOSIS — R339 Retention of urine, unspecified: Secondary | ICD-10-CM | POA: Diagnosis not present

## 2013-09-18 DIAGNOSIS — N3941 Urge incontinence: Secondary | ICD-10-CM | POA: Diagnosis not present

## 2013-09-18 DIAGNOSIS — N3020 Other chronic cystitis without hematuria: Secondary | ICD-10-CM | POA: Diagnosis not present

## 2013-09-19 ENCOUNTER — Telehealth: Payer: Self-pay | Admitting: *Deleted

## 2013-09-19 NOTE — Telephone Encounter (Signed)
Joint and back pain, abnormal bone x-rays of spine and hip from orthopedic surgeon, need second opinion

## 2013-09-19 NOTE — Telephone Encounter (Signed)
[name] from [facility] called, they are trying to schedule patients appt. But the Dr. Geraldine Solar a clear reason as to why you want her seen.  Please advise. CB# [phone] Fax # [phone]

## 2013-09-22 ENCOUNTER — Other Ambulatory Visit: Payer: Self-pay | Admitting: *Deleted

## 2013-09-22 MED ORDER — VALSARTAN 80 MG PO TABS: 80.0000 mg | ORAL_TABLET | Freq: Every day | ORAL | Status: DC

## 2013-09-23 ENCOUNTER — Telehealth: Payer: Self-pay | Admitting: Internal Medicine

## 2013-09-23 MED ORDER — TIOTROPIUM BROMIDE MONOHYDRATE 18 MCG IN CAPS: 18.0000 ug | ORAL_CAPSULE | Freq: Every day | RESPIRATORY_TRACT | Status: DC

## 2013-09-23 NOTE — Telephone Encounter (Signed)
Called spoke with pt. She reports the spiriva has not really helped. She wants to know if CDY any other recs that might help her breathing. Her current spiriva RX has expired. Please advise CDY thanks  Allergies  Allergen Reactions  . Chlordiazepoxide-Clidinium Other (See Comments)    Sleepy,weak  . Biaxin [Clarithromycin]     Foul taste, abd pain, diarrhea  . Codeine     REACTION: gi upset  . Tramadol      Current Outpatient Prescriptions on File Prior to Visit  Medication Sig Dispense Refill  . acetaminophen (TYLENOL) 500 MG tablet Take 500 mg by mouth every 6 (six) hours as needed for pain.       Marland Kitchen ALPRAZolam (XANAX) 0.5 MG tablet Take 1/2 tablet to 1 tablet two times a day  60 tablet  5  . Alum & Mag Hydroxide-Simeth (MAGIC MOUTHWASH) SOLN Swish with 2 teaspoonfuls by mouth as needed  240 mL  1  . amoxicillin-clavulanate (AUGMENTIN) 500-125 MG per tablet Take 1 tablet (500 mg total) by mouth 2 (two) times daily.  20 tablet  0  . aspirin 325 MG tablet Take 325 mg by mouth daily.        . B-D INS SYRINGE 0.5CC/31GX5/16 31G X 5/16" 0.5 ML MISC       . dicyclomine (BENTYL) 10 MG capsule       . fenofibrate (TRICOR) 145 MG tablet Take 1 tablet (145 mg total) by mouth daily.  30 tablet  5  . glucose blood (ACCU-CHEK AVIVA PLUS) test strip Use as instructed to check blood sugars 8 times per day dx code 250.02  250 each  3  . Insulin Aspart (NOVOLOG FLEXPEN [place]) Inject into the skin. SS 8-12      . insulin glargine (LANTUS) 100 UNIT/ML injection Inject 0.3-0.45 mLs (30-45 Units total) into the skin See admin instructions. Take 45 units in the morning and 30 units at bedtime  30 mL  3  . ipratropium (ATROVENT HFA) 17 MCG/ACT inhaler Inhale 2 puffs into the lungs 2 (two) times daily as needed (shortness of breath).  1 Inhaler  11  . ketoconazole (NIZORAL) 2 % cream Apply topically daily.  60 g  1  . levalbuterol (XOPENEX HFA) 45 MCG/ACT inhaler Inhale 2 puffs into the lungs every 6 (six) hours  as needed for wheezing or shortness of breath.  1 Inhaler  11  . meclizine (ANTIVERT) 25 MG tablet Take 1 tablet (25 mg total) by mouth 3 (three) times daily as needed for dizziness.  30 tablet  3  . metoprolol succinate (TOPROL-XL) 25 MG 24 hr tablet Take 1 tablet (25 mg total) by mouth daily.  30 tablet  5  . omeprazole (PRILOSEC) 20 MG capsule Take 20 mg by mouth daily.      . pioglitazone (ACTOS) 15 MG tablet Take 1 tablet (15 mg total) by mouth daily.  30 tablet  3  . predniSONE (DELTASONE) 10 MG tablet Take 1 tablet (10 mg total) by mouth daily.  7 tablet  0  . predniSONE (DELTASONE) 10 MG tablet 4 tabs for 2 days, then 2 tabs for 2 days, then 1 tab for 2 days, then stop  14 tablet  0  . predniSONE (DELTASONE) 20 MG tablet       . promethazine-codeine (PHENERGAN WITH CODEINE) 6.25-10 MG/5ML syrup Take 5 mLs by mouth every 6 (six) hours as needed for cough.  200 mL  0  . theophylline (THEODUR) 200 MG  12 hr tablet Take 1 tablet (200 mg total) by mouth 2 (two) times daily.  60 tablet  5  . tiotropium (SPIRIVA HANDIHALER) 18 MCG inhalation capsule Place 1 capsule (18 mcg total) into inhaler and inhale daily.  30 capsule  prn  . valsartan (DIOVAN) 80 MG tablet Take 1 tablet (80 mg total) by mouth daily.  30 tablet  5   No current facility-administered medications on file prior to visit.

## 2013-09-23 NOTE — Telephone Encounter (Signed)
All I could offer would be a nebulizer machine with albuterol. I think she has had one in the past and didn't like it, but I can't remember why. We could retry that, but otherwise I have nothing new for her.

## 2013-09-23 NOTE — Telephone Encounter (Signed)
Spoke with pt. She does not want to retry albuterol neb. She reports it makes her heart beat to fast. Refill on spiriva has been sent. Nothing further needed

## 2013-09-25 DIAGNOSIS — B351 Tinea unguium: Secondary | ICD-10-CM | POA: Diagnosis not present

## 2013-09-25 DIAGNOSIS — E1049 Type 1 diabetes mellitus with other diabetic neurological complication: Secondary | ICD-10-CM | POA: Diagnosis not present

## 2013-09-25 DIAGNOSIS — M79609 Pain in unspecified limb: Secondary | ICD-10-CM | POA: Diagnosis not present

## 2013-09-30 ENCOUNTER — Telehealth: Payer: Self-pay | Admitting: *Deleted

## 2013-09-30 ENCOUNTER — Telehealth: Payer: Self-pay | Admitting: Endocrinology

## 2013-09-30 ENCOUNTER — Other Ambulatory Visit: Payer: Self-pay | Admitting: *Deleted

## 2013-09-30 MED ORDER — PROMETHAZINE HCL 25 MG PO TABS: 25.0000 mg | ORAL_TABLET | Freq: Three times a day (TID) | ORAL | Status: DC | PRN

## 2013-09-30 MED ORDER — CHOLESTYRAMINE 4 G PO PACK: PACK | ORAL | Status: DC

## 2013-09-30 NOTE — Telephone Encounter (Signed)
Pt is having issues with nausea and blood sugar levels have been up and down all weekend. Please contact pt today as soon as possible has been very sick all weekend. Would not elaborate or give further info [phone]

## 2013-09-30 NOTE — Telephone Encounter (Signed)
rx sent

## 2013-09-30 NOTE — Telephone Encounter (Signed)
Patient called today she said she started having diarrhea Saturday night after eating some potato salad,  She said is very nauseous and was vomiting.  She wants to know if you can call her in something for the nausea and vomiting?

## 2013-09-30 NOTE — Telephone Encounter (Signed)
Phenergan 80m as needed # 6 tabs, take Questran powder for diarrhea

## 2013-09-30 NOTE — Telephone Encounter (Signed)
Patient states she left a message earlier  Patient was having severe vomiting and diarrhea  Nausea will not go away;dry heaves  Can patient have something for nausea?  Please advise patient   Thank you :)  Call back [phone]

## 2013-10-02 ENCOUNTER — Telehealth: Payer: Self-pay | Admitting: *Deleted

## 2013-10-02 NOTE — Telephone Encounter (Signed)
Sugar 77 in am; reduce Lantus to 20 in pm, Lantus in am to 42

## 2013-10-02 NOTE — Telephone Encounter (Signed)
Patient says her sugar has been going down, she does not feel well, and she has not heard from the Drs. Office that you referred her to, she said they did call her and question why she needed to be seen, they told her they would get back in touch with her but she has not heard from them and it's been over a week.  Please advise

## 2013-10-09 DIAGNOSIS — H04129 Dry eye syndrome of unspecified lacrimal gland: Secondary | ICD-10-CM | POA: Diagnosis not present

## 2013-10-09 DIAGNOSIS — G518 Other disorders of facial nerve: Secondary | ICD-10-CM | POA: Diagnosis not present

## 2013-10-22 ENCOUNTER — Other Ambulatory Visit: Payer: Self-pay | Admitting: *Deleted

## 2013-10-22 ENCOUNTER — Telehealth: Payer: Self-pay | Admitting: *Deleted

## 2013-10-22 MED ORDER — PIOGLITAZONE HCL 15 MG PO TABS: 15.0000 mg | ORAL_TABLET | Freq: Every day | ORAL | Status: DC

## 2013-10-22 MED ORDER — METOPROLOL SUCCINATE ER 25 MG PO TB24: 25.0000 mg | ORAL_TABLET | Freq: Every day | ORAL | Status: DC

## 2013-10-22 NOTE — Telephone Encounter (Signed)
Patient called, she said she's diarrhea and gas very badly.  She's tried GasX, beano, and a charcoal brand which made her sick and then she had diarrhea again.

## 2013-10-22 NOTE — Telephone Encounter (Signed)
Noted, patient is aware

## 2013-10-22 NOTE — Telephone Encounter (Signed)
She needs to take the Questran powder twice a day. If need be she can be sent a prescription for Lomotil tablets, one tablet up to 3 times a day, 10 tablets

## 2013-10-27 ENCOUNTER — Telehealth: Payer: Self-pay | Admitting: Endocrinology

## 2013-10-27 NOTE — Telephone Encounter (Signed)
Patient would like for Suanne Marker to call her back   Thank You :)

## 2013-11-03 NOTE — Telephone Encounter (Signed)
Pt calling for name of lab [facility]

## 2013-11-05 DIAGNOSIS — M2550 Pain in unspecified joint: Secondary | ICD-10-CM | POA: Diagnosis not present

## 2013-11-24 ENCOUNTER — Encounter: Payer: Self-pay | Admitting: Internal Medicine

## 2013-11-24 ENCOUNTER — Ambulatory Visit (INDEPENDENT_AMBULATORY_CARE_PROVIDER_SITE_OTHER): Payer: Medicare Other | Admitting: Internal Medicine

## 2013-11-24 VITALS — BP 128/62 | HR 81 | Ht 63.0 in | Wt 203.0 lb

## 2013-11-24 DIAGNOSIS — J449 Chronic obstructive pulmonary disease, unspecified: Secondary | ICD-10-CM | POA: Diagnosis not present

## 2013-11-24 DIAGNOSIS — Z23 Encounter for immunization: Secondary | ICD-10-CM | POA: Diagnosis not present

## 2013-11-24 NOTE — Patient Instructions (Signed)
Order- Prevnar pneumococcal vaccine    Dx COPD with emphysema

## 2013-11-24 NOTE — Progress Notes (Signed)
Patient ID: [name], female    DOB: [birth year], 78 y.o.   MRN: [id number]  HPI SATURATION QUALIFICATIONS: Patient Saturations on Room Air while Ambulating = 84% Patient Saturations on 2.5 Liters of oxygen while Ambulating = 92%[name],CMA  09/28/10-  75 yoF former smoker with COPD, complicated by chronic rhinitis, GERD. Last here 04/29/10- note reviewed. Acute visit.-- Woke with soreness across under her breasts. Losing voice. Coughing spells, with much liquid coming up and from nose. Prior to that hadn't been coughing. C/o weak, dizzy, light headed- relieved by her oxygen. . Moving into a smaller apartment. Denies fever. Easy DOE short walks room to room.   10/31/10- 78 yoF former smoker with COPD, complicated by chronic rhinitis, GERD. She denies major change since last here. Has to stay in to avoid heat and on her oxygen much of the time in the house. Coughs scant clear or lemon yellow, with some hoarseness which comes and goes. She describes hoarseness as worse with phone conversations, variable but rarely if ever clear. She had seen Dr [name] years ago for epistaxis. Easy choke with eating. Discussed aspiration precautions, chin tuck.   02/26/11- 59 yoF former smoker with COPD, complicated by chronic rhinitis, GERD. Has had flu vaccine. No coughing through the summer. We first cool days as she stepped outside she would begin to cough again. Dry hacking cough. Very aware of active reflux anytime she bends over, regurgitating recently swallowed food. Has also had some diarrhea. Complains of spasms intermittently left lower anterior chest and left upper quadrant. It may all be GI. She thinks it is part of her diabetic neuropathy. Has appointment soon with Dr.Brodie.  08/16/11-75 yoF former smoker with COPD, complicated by chronic rhinitis, GERD. During the winter had prolonged diarrhea evaluated by Dr [name] and apparently attributed to her diabetic neuropathy. May have had  esophagitis-was told to take Magic mouthwash. Gradually easier dyspnea on exertion. Now on oxygen 2-3 L most of the time. She came today with acute complaint of aching pain intermittently for the past several weeks, dull. Associated with mid back at strap level. Pain is gone right now. She saw no effect of activity or position and said pain was nonpleuritic or radiating.  CXR- 08/16/11- reviewed w/ her. NAD with no acute process.   08/28/11-75 yoF former smoker with COPD, complicated by chronic rhinitis, GERD,  DM. Back pain comes and goes. She implies that it is better today and says that she really has been present a long time as part of her degenerative back pain. Complains of feeling sluggish and tired. Recent bronchitis with productive green sputum watery nose no fever. Using Atrovent. Intolerant of albuterol and Symbicort because of overstimulation. Home oxygen is 2-1/2-3 L. CXR 08/15/11-  IMPRESSION:  Hyperinflation.  No active cardiopulmonary disease.  Original Report Authenticated By: [name], M.D.   12/28/11- 72 yoF former smoker with COPD, complicated by chronic rhinitis, GERD,  DM. Not doing well-stays on O2 all the time; since being on Spiriva at first did great but has noticed having to stop more and rest during the day(nap) She  stays indoors most of the time without many friends to do things with. She often has self-limited sharp twinges right anterior chest wall and left infrascapular back. With her known degenerative disc disease, we discussed the possibility this was nerve root irritation. Spiriva helps variable cough.  04/23/12- 25 yoF former smoker with COPD, complicated by chronic rhinitis, GERD,  DM. FOLLOWS FOR: still  has good and bad days with SOB and activity levels Watery rhinorrhea. No acute problems. No respiratory problems associated with surgery for nerve impingement in her right hand. She continues oxygen 3 or 3 a half liters/Advanced PFT: 01/16/2012 severe  obstructive airways disease with insignificant response to bronchodilator, air trapping, diffusion moderately reduced. FEV1 0.82/45%, FEV1/FEC 0.46. Emphysema pattern on the loop. TLC 100%, RV 148%, DLCO 47%.  10/22/12- 77 yoF former smoker with COPD, complicated by chronic rhinitis, GERD,  DM FOLLOWS FOR:has to pace herself more; continues to have nervous spell(could be from DM) SOB pretty much all the time Episodic shaky spells for which she says she's had evaluation by neurology, ENT, primary physician and ER. "Electric shock sensation" from her known neuropathy. Stress makes all of this worse. Breathing has not really changed. We talked about the possibility she was having a vasomotor/autonomic neuropathy related to her diabetes  01/21/13- 77 yoF former smoker with COPD, complicated by chronic rhinitis, GERD,  DM FOLLOWS FOR:has to pace herself more; continues to have nervous spell(could be from DM) SOB pretty much all the time Has been treated for a corneal abscess with Cipro, another antibiotic for cough, and for chronic cystitis. Got blistering intertrigo which Dr. Dwyane Dee treated with Flagyl by her report. Little change in her chronic cough with clear mucus. Twinges of bilateral tussive rib pains.  07/21/13-78 yoF former smoker with COPD, complicated by chronic rhinitis, GERD,  DM FOLLOWS FOR: Stays tired all the time; was in [facility] January 2015. Has RN that comes to her home. Pt states she has chest tightness as well. Stays tired. Home visiting nurse has completed. Chest gets tight-nebulizer helps. Watery nose.  09/02/2013 Acute OV  Complains of 2 weeks of cough and congestion . cough still going on-lt. green,occass. wheezing,chest tightness no fcs Was called in augmentin on 4/17, finished last dose yesterday. Mucus is some better but still coughing and wheezing . Cough is keeping her up at night.  Patient denies any this is chest pain, orthopnea, PND, or leg swelling CXR last ov showed  chronic changes with COPD 07/21/13   11/24/13- 78 yoF former smoker with COPD, complicated by chronic rhinitis, GERD,  DM FOLLOWS FOR:  Increased sob, tightness in chest and coughing spells O2 3.5L/ Advanced Needed prednisone/ Augmentin in April Has home health CNA helping now.  Dyspnea w/ exertion, persistent w/o much change. Occ cough, wheeze, watery nose. Occ choke/ light aspiration. Not needing nebulizer currently. CXR 07/21/13 IMPRESSION:  1. There is mild hyperinflation consistent with COPD. There is no  evidence of pneumonia.  2. There is no evidence of CHF. There is no pleural effusion.  Electronically Signed  By: David Martinique  On: 07/21/2013 10:50  ROS-see HPI Constitutional:   No-   weight loss, night sweats, fevers, chills, fatigue, lassitude. HEENT:   No-  headaches, difficulty swallowing, tooth/dental problems, sore throat,       No-  sneezing, itching, ear ache, nasal congestion, post nasal drip,  CV:  No-   chest pain, orthopnea, PND, swelling in lower extremities, anasarca,                                  dizziness, palpitations Resp: +shortness of breath with exertion or at rest.              No-   productive cough,  + non-productive cough,  No- coughing up of blood.  No-   change in color of mucus.  + wheezing.   Skin: No-   rash or lesions. GI:  +heartburn, indigestion, No-abdominal pain, nausea, vomiting,  GU: . MS:  No-   joint pain or swelling. Neuro-     nothing unusual Psych:  No- change in mood or affect. No depression or anxiety.  No memory loss.    Objective:  OBJ- Physical Exam General- Alert, Oriented, Affect-appropriate, Distress- none acute Skin- rash-none, lesions- none, excoriation- none Lymphadenopathy- none Head- atraumatic            Eyes- Gross vision intact, PERRLA, conjunctivae and secretions clear            Ears- Hearing, canals-normal            Nose- Clear, no-Septal dev, mucus, polyps, erosion, perforation              Throat- Mallampati II , mucosa clear , drainage- none, tonsils- atrophic Neck- flexible , trachea midline, no stridor , thyroid nl, carotid no bruit Chest - symmetrical excursion , unlabored           Heart/CV- RRR , no murmur , no gallop  , no rub, nl s1 s2                           - JVD- none , edema- none, stasis changes- none, varices- none           Lung-  wheeze+end-expiratory, cough- none , dullness-none, rub- none           Chest wall-  Abd- tender- Br/ Gen/ Rectal- Not done, not indicated Extrem- cyanosis- none, clubbing, none, atrophy- none, strength- nl Neuro- grossly intact to observation

## 2013-12-02 ENCOUNTER — Telehealth: Payer: Self-pay | Admitting: Internal Medicine

## 2013-12-02 ENCOUNTER — Other Ambulatory Visit: Payer: Self-pay | Admitting: *Deleted

## 2013-12-02 MED ORDER — LEVALBUTEROL TARTRATE 45 MCG/ACT IN AERO: 2.0000 | INHALATION_SPRAY | Freq: Four times a day (QID) | RESPIRATORY_TRACT | Status: DC | PRN

## 2013-12-02 NOTE — Telephone Encounter (Signed)
Pt aware RX has been sent in. Nothing further needed

## 2013-12-03 DIAGNOSIS — M545 Low back pain, unspecified: Secondary | ICD-10-CM | POA: Diagnosis not present

## 2013-12-03 DIAGNOSIS — M4120 Other idiopathic scoliosis, site unspecified: Secondary | ICD-10-CM | POA: Diagnosis not present

## 2013-12-04 ENCOUNTER — Telehealth: Payer: Self-pay | Admitting: Endocrinology

## 2013-12-04 NOTE — Telephone Encounter (Signed)
She can ask Dr Alvan Dame, not to take arthritis Rx

## 2013-12-04 NOTE — Telephone Encounter (Signed)
Patient is aware 

## 2013-12-04 NOTE — Telephone Encounter (Signed)
Patient said her orthopedic Dr. Marcille Buffy she has "terrible" scoliosis.  She wants to know what she can take for pain, she's in a considerable amount of pain.  She has an appt around the end of august to see Dr. Nelva Bush.

## 2013-12-04 NOTE — Telephone Encounter (Signed)
Patient would like for you to call her, she said it was very important.

## 2013-12-05 ENCOUNTER — Other Ambulatory Visit: Payer: Self-pay | Admitting: *Deleted

## 2013-12-05 MED ORDER — HYDROCODONE-ACETAMINOPHEN 5-325 MG PO TABS: 1.0000 | ORAL_TABLET | Freq: Three times a day (TID) | ORAL | Status: DC | PRN

## 2013-12-05 NOTE — Telephone Encounter (Signed)
5 mg tid prn

## 2013-12-05 NOTE — Telephone Encounter (Signed)
Please see below and advise.

## 2013-12-05 NOTE — Telephone Encounter (Signed)
Yes, she has taken that before.

## 2013-12-05 NOTE — Telephone Encounter (Signed)
Patient stated Dr. Roxy Manns said to ask [name] to give her something light for pain until she see Dr. Nelva Bush.  Patient need refill on Humalog flex pin, send to [facility] on [address].

## 2013-12-05 NOTE — Telephone Encounter (Signed)
She is allergic to tramadol, and codeine

## 2013-12-05 NOTE — Telephone Encounter (Signed)
Can she take hydrocodone?

## 2013-12-05 NOTE — Telephone Encounter (Signed)
She can try tramadol 50 mg, half to one tablet 3 times a day as needed

## 2013-12-09 DIAGNOSIS — H26499 Other secondary cataract, unspecified eye: Secondary | ICD-10-CM | POA: Diagnosis not present

## 2013-12-09 DIAGNOSIS — Z961 Presence of intraocular lens: Secondary | ICD-10-CM | POA: Diagnosis not present

## 2013-12-09 DIAGNOSIS — H01029 Squamous blepharitis unspecified eye, unspecified eyelid: Secondary | ICD-10-CM | POA: Diagnosis not present

## 2013-12-09 DIAGNOSIS — H18519 Endothelial corneal dystrophy, unspecified eye: Secondary | ICD-10-CM | POA: Diagnosis not present

## 2013-12-09 DIAGNOSIS — M531 Cervicobrachial syndrome: Secondary | ICD-10-CM | POA: Diagnosis not present

## 2013-12-09 DIAGNOSIS — G518 Other disorders of facial nerve: Secondary | ICD-10-CM | POA: Diagnosis not present

## 2013-12-09 DIAGNOSIS — E119 Type 2 diabetes mellitus without complications: Secondary | ICD-10-CM | POA: Diagnosis not present

## 2013-12-09 DIAGNOSIS — H04129 Dry eye syndrome of unspecified lacrimal gland: Secondary | ICD-10-CM | POA: Diagnosis not present

## 2013-12-09 LAB — HM DIABETES EYE EXAM

## 2013-12-10 ENCOUNTER — Telehealth: Payer: Self-pay | Admitting: Endocrinology

## 2013-12-10 ENCOUNTER — Other Ambulatory Visit: Payer: Self-pay | Admitting: *Deleted

## 2013-12-10 ENCOUNTER — Encounter: Payer: Self-pay | Admitting: *Deleted

## 2013-12-10 MED ORDER — INSULIN ASPART 100 UNIT/ML FLEXPEN: PEN_INJECTOR | SUBCUTANEOUS | Status: DC

## 2013-12-10 NOTE — Telephone Encounter (Signed)
rx sent

## 2013-12-10 NOTE — Telephone Encounter (Signed)
Pt needs novolog flex pen called in please

## 2013-12-16 ENCOUNTER — Ambulatory Visit (INDEPENDENT_AMBULATORY_CARE_PROVIDER_SITE_OTHER): Payer: Medicare Other | Admitting: Endocrinology

## 2013-12-16 ENCOUNTER — Encounter: Payer: Self-pay | Admitting: Endocrinology

## 2013-12-16 ENCOUNTER — Other Ambulatory Visit (INDEPENDENT_AMBULATORY_CARE_PROVIDER_SITE_OTHER): Payer: Medicare Other

## 2013-12-16 VITALS — BP 130/79 | HR 88 | Temp 98.3°F | Resp 16 | Ht 63.0 in | Wt 203.8 lb

## 2013-12-16 DIAGNOSIS — R0989 Other specified symptoms and signs involving the circulatory and respiratory systems: Secondary | ICD-10-CM

## 2013-12-16 DIAGNOSIS — E785 Hyperlipidemia, unspecified: Secondary | ICD-10-CM | POA: Diagnosis not present

## 2013-12-16 DIAGNOSIS — E1149 Type 2 diabetes mellitus with other diabetic neurological complication: Secondary | ICD-10-CM

## 2013-12-16 DIAGNOSIS — I10 Essential (primary) hypertension: Secondary | ICD-10-CM | POA: Diagnosis not present

## 2013-12-16 DIAGNOSIS — R0609 Other forms of dyspnea: Secondary | ICD-10-CM | POA: Diagnosis not present

## 2013-12-16 DIAGNOSIS — R0600 Dyspnea, unspecified: Secondary | ICD-10-CM

## 2013-12-16 LAB — COMPREHENSIVE METABOLIC PANEL
ALT: 16 U/L (ref 0–35)
AST: 26 U/L (ref 0–37)
Albumin: 3.9 g/dL (ref 3.5–5.2)
Alkaline Phosphatase: 34 U/L — ABNORMAL LOW (ref 39–117)
BILIRUBIN TOTAL: 0.4 mg/dL (ref 0.2–1.2)
BUN: 16 mg/dL (ref 6–23)
CO2: 28 meq/L (ref 19–32)
CREATININE: 0.7 mg/dL (ref 0.4–1.2)
Calcium: 9.4 mg/dL (ref 8.4–10.5)
Chloride: 96 mEq/L (ref 96–112)
GFR: 84.96 mL/min (ref >60.00)
Glucose, Bld: 188 mg/dL — ABNORMAL HIGH (ref 70–99)
Potassium: 4.7 mEq/L (ref 3.5–5.1)
Sodium: 136 mEq/L (ref 135–145)
Total Protein: 7.4 g/dL (ref 6.0–8.3)

## 2013-12-16 LAB — LIPID PANEL
CHOL/HDL RATIO: 5
Cholesterol: 205 mg/dL — ABNORMAL HIGH (ref 0–200)
HDL: 43.5 mg/dL (ref >39.00)
LDL Cholesterol: 124 mg/dL — ABNORMAL HIGH (ref 0–99)
NONHDL: 161.5
Triglycerides: 188 mg/dL — ABNORMAL HIGH (ref 0.0–149.0)
VLDL: 37.6 mg/dL (ref 0.0–40.0)

## 2013-12-16 LAB — HEMOGLOBIN A1C: HEMOGLOBIN A1C: 8.9 % — AB (ref 4.6–6.5)

## 2013-12-16 NOTE — Progress Notes (Signed)
Quick Note:  A1c is 8.9, about the same. Bad cholesterol is higher, would like her to try the lowest dose of pravastatin 10 mg daily ______

## 2013-12-16 NOTE — Progress Notes (Signed)
Patient ID: [name], female   DOB: May 11, 1935, 78 y.o.   MRN: [id number]   Reason for Appointment: follow-up of multiple problems  History of Present Illness    DIABETES since 1980   She has been on insulin using Lantus and Humalog for several years and usually A1c around 8% despite fairly good compliance with diet, exercise and glucose monitoring. She also had been on Actos previously which probably benefited her especially with her fatty liver but this was stopped because of fear of side effects and she has not resumed this.      Recent history:  Her A1c has been consistently around 9% even though her blood sugars at home are usually averaging about 160-180 More recently her blood sugar averages slightly better compared to her last visit She does have significant fluctuation in blood sugars with the following patterns:  Overnight/early morning readings have been overall the lowest without any hypoglycemia  Before breakfast her blood sugars are mostly good  Blood sugars and to be relatively higher after breakfast even though she is eating a small meal. Today had a significantly high reading after breakfast since she had a relatively higher fat meals and also drank some milk  Her sugars are relatively good at lunchtime but overall higher in the afternoon and highest after supper and bedtime  She generally afraid to take enough insulin for her evening meal for fear of hypoglycemia; usually eating small meals  She has most fluctuation in the afternoon Has been refusing to take Actos which had helped before because of fear of side effects Also may occasionally have high readings with intercurrent illnesses since steroids  Insulin regimen: Lantus twice a day, 43--26 with syringe and Humalog 8-10  a.c. 3 times a day, less at lunch if eating less  Oral hypoglycemic drugs: None, previously on Actos        Side effects from medications: None         Proper timing of medications in  relation to meals: Yes.         Monitors blood glucose:  4-5 times a day   Glucometer:  Accu-Chek         Blood Glucose readings recently from meter download:  PREMEAL Breakfast Lunch Dinner Bedtime Overall  Glucose range: 94-174 101-196 144-234 151-225   Mean/median: 139  166 187 161   POST-MEAL PC Breakfast PC Lunch PC Dinner  Glucose range: 115-232 130-218   Mean/median:  165     Hypoglycemia:  none recently        Meals: 3 meals per day. Sometimes drinking milk with meals,  Supper 6 to 7 PM     Physical activity: exercise: Unable to do any because of dyspnea and fatigue           Dietician visit: Most recent: Several years ago           Complications: are: Peripheral neuropathy with sensory loss     Wt Readings from Last 3 Encounters:  12/16/13 203 lb 12.8 oz (92.443 kg)  11/24/13 203 lb (92.08 kg)  09/15/13 201 lb 3.2 oz (91.264 kg)    Lab Results  Component Value Date   HGBA1C 9.0* 09/15/2013   HGBA1C 9.0* 05/11/2013   HGBA1C 8.8* 02/12/2013   Lab Results  Component Value Date   MICROALBUR 1.1 09/15/2013   LDLCALC 100* 09/15/2013   CREATININE 0.6 09/15/2013       Medication List       This list  is accurate as of: 12/16/13 11:07 AM.  Always use your most recent med list.               acetaminophen 500 MG tablet  Commonly known as:  TYLENOL  Take 500 mg by mouth every 6 (six) hours as needed for pain.     ALPRAZolam 0.5 MG tablet  Commonly known as:  XANAX  Take 1/2 tablet to 1 tablet two times a day     aspirin 325 MG tablet  Take 325 mg by mouth daily.     B-D INS SYRINGE 0.5CC/31GX5/16 31G X 5/16" 0.5 ML Misc  Generic drug:  Insulin Syringe-Needle U-100     cholestyramine 4 G packet  Commonly known as:  QUESTRAN  Take one package as needed for Diarrhea.     dicyclomine 10 MG capsule  Commonly known as:  BENTYL     escitalopram 10 MG tablet  Commonly known as:  LEXAPRO  Take 10 mg by mouth daily.     fenofibrate 145 MG tablet  Commonly known  as:  TRICOR  Take 1 tablet (145 mg total) by mouth daily.     glucose blood test strip  Commonly known as:  ACCU-CHEK AVIVA PLUS  Use as instructed to check blood sugars 8 times per day dx code 250.02     HYDROcodone-acetaminophen 5-325 MG per tablet  Commonly known as:  NORCO/VICODIN  Take 1 tablet by mouth 3 (three) times daily as needed for moderate pain.     insulin aspart 100 UNIT/ML FlexPen  Commonly known as:  NOVOLOG FLEXPEN  Inject 8-12 units per sliding scale     insulin glargine 100 UNIT/ML injection  Commonly known as:  LANTUS  Inject 0.3-0.45 mLs (30-45 Units total) into the skin See admin instructions. Take 45 units in the morning and 30 units at bedtime     ipratropium 17 MCG/ACT inhaler  Commonly known as:  ATROVENT HFA  Inhale 2 puffs into the lungs 2 (two) times daily as needed (shortness of breath).     levalbuterol 45 MCG/ACT inhaler  Commonly known as:  XOPENEX HFA  Inhale 2 puffs into the lungs every 6 (six) hours as needed for wheezing or shortness of breath.     magic mouthwash Soln  Swish with 2 teaspoonfuls by mouth as needed     meclizine 25 MG tablet  Commonly known as:  ANTIVERT  Take 1 tablet (25 mg total) by mouth 3 (three) times daily as needed for dizziness.     metoprolol succinate 25 MG 24 hr tablet  Commonly known as:  TOPROL-XL  Take 1 tablet (25 mg total) by mouth daily.     omeprazole 20 MG capsule  Commonly known as:  PRILOSEC  Take 20 mg by mouth daily.     pioglitazone 15 MG tablet  Commonly known as:  ACTOS  Take 1 tablet (15 mg total) by mouth daily.     promethazine 25 MG tablet  Commonly known as:  PHENERGAN  Take 1 tablet (25 mg total) by mouth every 8 (eight) hours as needed for nausea or vomiting.     promethazine-codeine 6.25-10 MG/5ML syrup  Commonly known as:  PHENERGAN with CODEINE  Take 5 mLs by mouth every 6 (six) hours as needed for cough.     tiotropium 18 MCG inhalation capsule  Commonly known as:   SPIRIVA HANDIHALER  Place 1 capsule (18 mcg total) into inhaler and inhale daily.     valsartan  80 MG tablet  Commonly known as:  DIOVAN  Take 1 tablet (80 mg total) by mouth daily.     VIGAMOX 0.5 % ophthalmic solution  Generic drug:  moxifloxacin        Allergies:  Allergies  Allergen Reactions  . Chlordiazepoxide-Clidinium Other (See Comments)    Sleepy,weak  . Biaxin [Clarithromycin]     Foul taste, abd pain, diarrhea  . Codeine     REACTION: gi upset  . Tramadol     Past Medical History  Diagnosis Date  . Irritable bowel syndrome   . Other chronic nonalcoholic liver disease   . Esophageal reflux   . Allergic rhinitis, cause unspecified     Sinus CT Rec 12-23-2009  . 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 Sciatica   . Shingles 2010  . Steatohepatitis   . Diabetes mellitus   . Diverticulosis   . Hiatal hernia   . Esophageal stricture   . Diarrhea   . GERD (gastroesophageal reflux disease)   . Shortness of breath   . Hypertension   . Arthritis   . Glaucoma   . Complication of anesthesia     takes along time to wake up  . Oxygen deficiency   . Benign paroxysmal positional vertigo 08/09/2012  . Neuropathy in diabetes 08/09/2012    Past Surgical History  Procedure Laterality Date  . Cholecystectomy open  1978  . Liver biopsy  08-1992  . Parathyroid exploration    . Tonsillectomy and adenoidectomy    . Total abdominal hysterectomy    . Esophagogastroduodenoscopy  7062,37-62    H Hernia,es.stricture s/p dil 75F  . Colonoscopy  07-2001    mild diverticulosis  . Carpal tunnel release      right hand  . Cataract extraction      Bilateral  . Ulnar nerve transposition  12/07/2011    Procedure: ULNAR NERVE DECOMPRESSION/TRANSPOSITION;this was cancelled-not done  Surgeon: Cammie Sickle., MD;  Location: [place];  Service: Orthopedics;  Laterality: Right;  right ulnar nerve in situ  decompression  . Ulnar tunnel release  03/07/2012    Procedure: CUBITAL TUNNEL RELEASE;  Surgeon: Roseanne Kaufman, MD;  Location: [place];  Service: Orthopedics;  Laterality: Right;  ulnar nerve release at the elbow      Family History  Problem Relation Age of Onset  . Lung cancer Mother     small cell;Byssinosis  . Heart disease Father   . Lung cancer Sister   . Liver cancer Sister     ? mets from another area of the body  . Diabetes      grandmother  . Stroke Maternal Grandfather     Social History:  reports that she quit smoking about 25 years ago. Her smoking use included Cigarettes. She smoked 0.00 packs per day. She has never used smokeless tobacco. She reports that she does not drink alcohol or use illicit drugs.  Review of Systems:  She has been told by her orthopedic surgeon that she has significant scoliosis and degenerative disease of the spine and continues to have low back pain. Recently given Vicodin but is taking this fairly sparingly as suggested by our space. Is going to see the spine pain specialist in that group Also has pain in the finger joints.  HYPERTENSION:   blood pressure is well-controlled now. No lightheadedness on standing up. Blood pressure has been variable at home.  HYPERLIPIDEMIA: The lipid abnormality consists of elevated LDL previously treated with pravastatin which she had no side effects from. She tried this again and had pain in the right arm  muscle and stopped it.  Since her triglycerides were relatively higher and LDL not significantly high she has been on fenofibrate  With relatively good control of lipids   Lab Results  Component Value Date   CHOL 182 09/15/2013   HDL 42.20 09/15/2013   LDLCALC 100* 09/15/2013   LDLDIRECT 149.0 02/12/2013   TRIG 199.0* 09/15/2013   CHOLHDL 4 09/15/2013    She is followed by pulmonologist for chronic COPD, taking home oxygen and still having dyspnea  Dizziness- during her  hospitalization  possible BPPV was diagnosed.  Vestibular PT was done which was helpful but she still has mild episodes of dizziness and feeling off balance Also asking about fullness in the ear    Continues to complain about lack of energy .  Lab Results  Component Value Date   TSH 0.55 09/15/2013   Diarrhea: She has had long-standing intermittent diarrhea which was worse last month. Is taking Questran as needed  Foot exam done in 8/15   Examination:   BP 130/79  Pulse 88  Temp(Src) 98.3 F (36.8 C)  Resp 16  Ht 5' 3"  (1.6 m)  Wt 203 lb 12.8 oz (92.443 kg)  BMI 36.11 kg/m2  SpO2 93%  Body mass index is 36.11 kg/(m^2).   No ankle edema Foot exam shows sensory loss distally, normal pulses and no skin lesions  ASSESSMENT/ PLAN:   Diabetes type 2   The patient's diabetes control appears to be overall better with periods of hyperglycemia again mostly in the evenings Also will have relatively higher readings after breakfast based on her type of meal See history of present illness for current analysis of her blood sugar patterns and current management Because of potential for hypoglycemia she does not like to take relatively larger doses of insulin even though she was advised a higher dose of Humalog before breakfast and supper She is fairly compliant with checking her blood sugars 5-6 times a day Continues to be somewhat insulin resistant and also has higher readings when she has intercurrent illnesses and steroids for her COPD Insulin changes: Increase morning Lantus by 2 units and also suppertime Humalog by 2 units Discussed blood sugar targets and duration of action of rapid acting insulin  HYPERTENSION: Well-controlled, to continue same regimen  Hyperlipidemia: She will continue fenofibrate as triglycerides and LDL are relatively better and she has reportedly statin intolerance  Joint and back pain: Advised her to take Vicodin as needed to keep comfortable and not be  afraid of "addiction"  Counseling time over 50% of today's 25 minute visit  Aluel Schwarz 12/16/2013, 11:07 AM   Labs as follows, LDL higher  Appointment on 12/16/2013  Component Date Value Ref Range Status  . Hemoglobin A1C 12/16/2013 8.9* 4.6 - 6.5 % Final   Glycemic Control Guidelines for People with Diabetes:Non Diabetic:  <6%Goal of Therapy: <7%Additional Action Suggested:  >8%   . Sodium 12/16/2013 136  135 - 145 mEq/L Final  . Potassium 12/16/2013 4.7  3.5 - 5.1 mEq/L Final  . Chloride 12/16/2013 96  96 - 112 mEq/L Final  . CO2 12/16/2013 28  19 - 32 mEq/L Final  . Glucose, Bld 12/16/2013 188* 70 - 99 mg/dL Final  . BUN 12/16/2013 16  6 - 23 mg/dL Final  . Creatinine, Ser 12/16/2013 0.7  0.4 -  1.2 mg/dL Final  . Total Bilirubin 12/16/2013 0.4  0.2 - 1.2 mg/dL Final  . Alkaline Phosphatase 12/16/2013 34* 39 - 117 U/L Final  . AST 12/16/2013 26  0 - 37 U/L Final  . ALT 12/16/2013 16  0 - 35 U/L Final  . Total Protein 12/16/2013 7.4  6.0 - 8.3 g/dL Final  . Albumin 12/16/2013 3.9  3.5 - 5.2 g/dL Final  . Calcium 12/16/2013 9.4  8.4 - 10.5 mg/dL Final  . GFR 12/16/2013 84.96  >60.00 mL/min Final  . Cholesterol 12/16/2013 205* 0 - 200 mg/dL Final   ATP III Classification       Desirable:  < 200 mg/dL               Borderline High:  200 - 239 mg/dL          High:  > = 240 mg/dL  . Triglycerides 12/16/2013 188.0* 0.0 - 149.0 mg/dL Final   Normal:  <150 mg/dLBorderline High:  150 - 199 mg/dL  . HDL 12/16/2013 43.50  >39.00 mg/dL Final  . VLDL 12/16/2013 37.6  0.0 - 40.0 mg/dL Final  . LDL Cholesterol 12/16/2013 124* 0 - 99 mg/dL Final  . Total CHOL/HDL Ratio 12/16/2013 5   Final                  Men          Women1/2 Average Risk     3.4          3.3Average Risk          5.0          4.42X Average Risk          9.6          7.13X Average Risk          15.0          11.0                      . NonHDL 12/16/2013 161.50   Final   NOTE:  Non-HDL goal should be 30 mg/dL higher than  patient's LDL goal (i.e. LDL goal of < 70 mg/dL, would have non-HDL goal of < 100 mg/dL)  Abstract on 12/10/2013  Component Date Value Ref Range Status  . HM Diabetic Eye Exam 12/09/2013 No Retinopathy  No Retinopathy Final

## 2013-12-16 NOTE — Patient Instructions (Signed)
Extra 1-2 units Novolog for drinking milk; take 10-12 Novolog at dinner daily Lantus 45 in ams

## 2013-12-18 ENCOUNTER — Other Ambulatory Visit: Payer: Self-pay | Admitting: *Deleted

## 2013-12-18 MED ORDER — PRAVASTATIN SODIUM 10 MG PO TABS: 10.0000 mg | ORAL_TABLET | Freq: Every day | ORAL | Status: DC

## 2013-12-24 ENCOUNTER — Telehealth: Payer: Self-pay | Admitting: Internal Medicine

## 2013-12-24 MED ORDER — PREDNISONE 10 MG PO TABS: ORAL_TABLET | ORAL | Status: DC

## 2013-12-24 NOTE — Telephone Encounter (Signed)
Dr. Annamaria Boots has left for the day.  [place] please advise. Thanks.

## 2013-12-24 NOTE — Telephone Encounter (Signed)
Called and spoke to pt. Pt c/o increase in SOB, chest tightness with rest and activity, prod cough with clear mucous. Pt denies swelling and f/c/s. Pt states she has been wearing her O2 at 3.5-4Lpm and her sats are at 92-93%. Pt states when she takes her O2 off to fill her tank her O2 drops to low 80's. Pt stated she is using her xopenex hfa every 4-5 hours. Pt last seen by CY on 11/24/2013.   CY please advise.   Allergies  Allergen Reactions  . Chlordiazepoxide-Clidinium Other (See Comments)    Sleepy,weak  . Biaxin [Clarithromycin]     Foul taste, abd pain, diarrhea  . Codeine     REACTION: gi upset  . Tramadol      Current Outpatient Prescriptions on File Prior to Visit  Medication Sig Dispense Refill  . acetaminophen (TYLENOL) 500 MG tablet Take 500 mg by mouth every 6 (six) hours as needed for pain.       Marland Kitchen ALPRAZolam (XANAX) 0.5 MG tablet Take 1/2 tablet to 1 tablet two times a day  60 tablet  5  . Alum & Mag Hydroxide-Simeth (MAGIC MOUTHWASH) SOLN Swish with 2 teaspoonfuls by mouth as needed  240 mL  1  . aspirin 325 MG tablet Take 325 mg by mouth daily.        . B-D INS SYRINGE 0.5CC/31GX5/16 31G X 5/16" 0.5 ML MISC       . cholestyramine (QUESTRAN) 4 G packet Take one package as needed for Diarrhea.  60 each  1  . dicyclomine (BENTYL) 10 MG capsule       . escitalopram (LEXAPRO) 10 MG tablet Take 10 mg by mouth daily.      . fenofibrate (TRICOR) 145 MG tablet Take 1 tablet (145 mg total) by mouth daily.  30 tablet  5  . glucose blood (ACCU-CHEK AVIVA PLUS) test strip Use as instructed to check blood sugars 8 times per day dx code 250.02  250 each  3  . HYDROcodone-acetaminophen (NORCO/VICODIN) 5-325 MG per tablet Take 1 tablet by mouth 3 (three) times daily as needed for moderate pain.  90 tablet  0  . insulin aspart (NOVOLOG FLEXPEN) 100 UNIT/ML FlexPen Inject 8-12 units per sliding scale  15 mL  3  . insulin glargine (LANTUS) 100 UNIT/ML injection Inject 0.3-0.45 mLs (30-45  Units total) into the skin See admin instructions. Take 45 units in the morning and 30 units at bedtime  30 mL  3  . ipratropium (ATROVENT HFA) 17 MCG/ACT inhaler Inhale 2 puffs into the lungs 2 (two) times daily as needed (shortness of breath).  1 Inhaler  11  . levalbuterol (XOPENEX HFA) 45 MCG/ACT inhaler Inhale 2 puffs into the lungs every 6 (six) hours as needed for wheezing or shortness of breath.  1 Inhaler  11  . meclizine (ANTIVERT) 25 MG tablet Take 1 tablet (25 mg total) by mouth 3 (three) times daily as needed for dizziness.  30 tablet  3  . metoprolol succinate (TOPROL-XL) 25 MG 24 hr tablet Take 1 tablet (25 mg total) by mouth daily.  30 tablet  3  . omeprazole (PRILOSEC) 20 MG capsule Take 20 mg by mouth daily.      . pioglitazone (ACTOS) 15 MG tablet Take 1 tablet (15 mg total) by mouth daily.  30 tablet  3  . pravastatin (PRAVACHOL) 10 MG tablet Take 1 tablet (10 mg total) by mouth daily.  30 tablet  1  .  promethazine (PHENERGAN) 25 MG tablet Take 1 tablet (25 mg total) by mouth every 8 (eight) hours as needed for nausea or vomiting.  6 tablet  0  . promethazine-codeine (PHENERGAN WITH CODEINE) 6.25-10 MG/5ML syrup Take 5 mLs by mouth every 6 (six) hours as needed for cough.  200 mL  0  . tiotropium (SPIRIVA HANDIHALER) 18 MCG inhalation capsule Place 1 capsule (18 mcg total) into inhaler and inhale daily.  30 capsule  prn  . valsartan (DIOVAN) 80 MG tablet Take 1 tablet (80 mg total) by mouth daily.  30 tablet  5  . VIGAMOX 0.5 % ophthalmic solution        No current facility-administered medications on file prior to visit.

## 2013-12-24 NOTE — Telephone Encounter (Signed)
Let pt know that she must be seen tomorrow as a work in with whoever can see her.  I do not mind calling in a prednisone taper in the meantime, but she needs to be seen with her oxygen sats falling. She is to go to ER tonight if she gets worse.  Prednisone taper over 8 days.

## 2013-12-24 NOTE — Telephone Encounter (Signed)
Pt is aware that the dose of prednisone given by [facility] is the same dose CY gives; pt will get Rx in the morning. Also, pt is aware that I will speak with CY in the morning and see if he wants her to come in the AM or finish prednisone then come in for follow up.   CY please advise on Thursday AM.

## 2013-12-24 NOTE — Telephone Encounter (Signed)
Called pt. She reports she will call tomorrow for appt. RX sent in.

## 2013-12-25 ENCOUNTER — Telehealth: Payer: Self-pay | Admitting: *Deleted

## 2013-12-25 MED ORDER — GLUCOSE BLOOD VI STRP: ORAL_STRIP | Status: DC

## 2013-12-25 NOTE — Telephone Encounter (Signed)
LMTCB

## 2013-12-25 NOTE — Telephone Encounter (Signed)
If her blood sugar is over 200 she will increase her Lantus by 4 units and Humalog by 6 units. There is no evidence that coenzyme Q 10 helps with muscle pain and there is no prescription available

## 2013-12-25 NOTE — Telephone Encounter (Signed)
My thought would be to have her finish prednisone and find her a time to come in next week

## 2013-12-25 NOTE — Telephone Encounter (Addendum)
Patient would like to talk to you about her medication

## 2013-12-25 NOTE — Telephone Encounter (Signed)
Noted, patient is aware

## 2013-12-25 NOTE — Telephone Encounter (Signed)
Patient called, she said another Dr. Doristine Section her back on prednisone for 8 days, she wants to know what she should do in case her sugar goes up. She also wanted me to ask you if there was any way she can get Co-Q-10 in a prescription form.  She spoke with her pharmacist who told her that it would help her with the pain in the arm that's caused by the statin.  Please advise.

## 2013-12-25 NOTE — Telephone Encounter (Signed)
Called and spoke to pt. Pt stated she doesn't want to come in if she doesn't need to. I voiced to the pt exactly what Dr. Annamaria Boots said. Pt stated she would take the pred taper and then call back to schedule a f/u if she feels the medication hasn't helped or is feeling worse. I offered to schedule appt now and have pt call in and cancel if necessary, pt refused.    Will forward to Dr. Annamaria Boots to make aware.

## 2013-12-26 NOTE — Telephone Encounter (Signed)
Patient asked if you would call her, she forgot what you told her concerning her insulin.

## 2013-12-26 NOTE — Telephone Encounter (Signed)
Katie please advise if there was something else needed in this message.  Per Elise's documentation pt refused appt and wants to call once she starts Prednisone. Pt offered appt multiple times and refused.

## 2013-12-26 NOTE — Telephone Encounter (Signed)
I called to check on patient-she states she has started the Prednisone from [facility] and feels shaky from it but will continue to dose. Pt is aware to check back with me on Tuesday 12-30-13 to see how she is doing after her Prednisone taper. Pt did not sound bad with her breathing during our conversation. Nothing more needed at this time.

## 2013-12-29 ENCOUNTER — Other Ambulatory Visit: Payer: Self-pay | Admitting: *Deleted

## 2013-12-29 MED ORDER — OMEPRAZOLE 20 MG PO CPDR: 20.0000 mg | DELAYED_RELEASE_CAPSULE | Freq: Every day | ORAL | Status: DC

## 2013-12-30 ENCOUNTER — Telehealth: Payer: Self-pay | Admitting: Internal Medicine

## 2013-12-30 NOTE — Telephone Encounter (Signed)
Phone message closed in error.  Will route to [place].  Please advise/call patient per her request.  Thank you.

## 2013-12-30 NOTE — Telephone Encounter (Signed)
Spoke with pt, states that her 02 sats have stayed in the low 90's since taking the prednisone, having intensified "blackout spells", taking her last doses today but does not believe that she can tolerate any further doses of prednisone.    States her SOB has improved also, still having SOB with exertion.  Having restlessness at night since being on prednisone.Has noticed that her back pain has improved since being on prednisone. Going to see Dr. Nelva Bush (orthopedist) on 01/03/14 for her back/hip pain.   Pt was hesitant about relaying this to me, states that she wishes to speak in detail with Katie. Katie please advise.  Thank you.

## 2013-12-30 NOTE — Telephone Encounter (Signed)
I called and spoke with patient to check on her; she states she is feeling better with her breathing and understands that the side effects will wear off. Pt is in no acute distress and will contact our office if anything new arises.

## 2014-01-03 DIAGNOSIS — M47817 Spondylosis without myelopathy or radiculopathy, lumbosacral region: Secondary | ICD-10-CM | POA: Diagnosis not present

## 2014-01-06 ENCOUNTER — Telehealth: Payer: Self-pay | Admitting: Endocrinology

## 2014-01-06 NOTE — Telephone Encounter (Signed)
She is needing a cortisone injection in her spine.  She will have it next week.   Wants to know what insulins to increase.  Current dose:  Novolog: 8-10acB, 5-7acL, 8acS, Lantus 45AM, and 25PM.   Please route this to [place].  I will be on vacation starting late today.  Thank you

## 2014-01-07 DIAGNOSIS — R339 Retention of urine, unspecified: Secondary | ICD-10-CM | POA: Diagnosis not present

## 2014-01-08 DIAGNOSIS — B351 Tinea unguium: Secondary | ICD-10-CM | POA: Diagnosis not present

## 2014-01-08 DIAGNOSIS — M79609 Pain in unspecified limb: Secondary | ICD-10-CM | POA: Diagnosis not present

## 2014-01-14 NOTE — Telephone Encounter (Signed)
9/9 2:46 am 180 7:04 am 220 10:20 am 211 1:05 pm 166 9/8 9:11 pm 268, 5:21 pm 283 12:50 pm 136 6:26 am 171 2:47 am 205 9/7 9:18 pm 244, 4:50 pm 156 11:23 am 274, 7:20 am 202 lantus 45 in am and 25 in pm Novolog 7-8 units depending on sugars lunchtime 8-10, dinner 8-10 and she's taking an extra 3 units if her sugars are in the 200 at bedtime  She wants to know if her diabetes is getting worse since her sugars are so much higher and she can't get them down.

## 2014-01-14 NOTE — Telephone Encounter (Signed)
Increase Lantus to 28 units in the evening and 48 units in the morning Also take at least 12-14 units of Humalog with breakfast and supper Recommend starting Actos 15 mg daily to help with insulin action

## 2014-01-14 NOTE — Telephone Encounter (Signed)
Noted, patient given information, she said she's already taking the actos 15 mg.

## 2014-01-14 NOTE — Telephone Encounter (Signed)
Noted, she is aware

## 2014-01-14 NOTE — Telephone Encounter (Signed)
Patient stated that her insulin is not working, her B/S is running high, she is taking extra shots and it is still not working. Please advise

## 2014-01-14 NOTE — Telephone Encounter (Signed)
Increase Actos to 30 mg for the next 30 days

## 2014-01-22 ENCOUNTER — Telehealth: Payer: Self-pay | Admitting: Endocrinology

## 2014-01-22 ENCOUNTER — Other Ambulatory Visit: Payer: Self-pay | Admitting: *Deleted

## 2014-01-22 MED ORDER — INSULIN ASPART 100 UNIT/ML FLEXPEN: PEN_INJECTOR | SUBCUTANEOUS | Status: DC

## 2014-01-22 NOTE — Telephone Encounter (Signed)
If her sugar is dropping after midnight she can reduce her Lantus in the evening by 4 units

## 2014-01-22 NOTE — Telephone Encounter (Signed)
Please advise 

## 2014-01-22 NOTE — Telephone Encounter (Signed)
pts blood sugar dropping to 60 and 70 at night please advise

## 2014-01-28 DIAGNOSIS — M6281 Muscle weakness (generalized): Secondary | ICD-10-CM | POA: Diagnosis not present

## 2014-01-28 DIAGNOSIS — R269 Unspecified abnormalities of gait and mobility: Secondary | ICD-10-CM | POA: Diagnosis not present

## 2014-01-28 DIAGNOSIS — M549 Dorsalgia, unspecified: Secondary | ICD-10-CM | POA: Diagnosis not present

## 2014-01-28 DIAGNOSIS — E119 Type 2 diabetes mellitus without complications: Secondary | ICD-10-CM | POA: Diagnosis not present

## 2014-01-28 DIAGNOSIS — M4120 Other idiopathic scoliosis, site unspecified: Secondary | ICD-10-CM | POA: Diagnosis not present

## 2014-01-28 DIAGNOSIS — J449 Chronic obstructive pulmonary disease, unspecified: Secondary | ICD-10-CM | POA: Diagnosis not present

## 2014-01-28 DIAGNOSIS — Z794 Long term (current) use of insulin: Secondary | ICD-10-CM | POA: Diagnosis not present

## 2014-01-29 ENCOUNTER — Encounter: Payer: Self-pay | Admitting: Internal Medicine

## 2014-01-29 DIAGNOSIS — E119 Type 2 diabetes mellitus without complications: Secondary | ICD-10-CM | POA: Diagnosis not present

## 2014-01-29 DIAGNOSIS — M549 Dorsalgia, unspecified: Secondary | ICD-10-CM | POA: Diagnosis not present

## 2014-01-29 DIAGNOSIS — M4120 Other idiopathic scoliosis, site unspecified: Secondary | ICD-10-CM | POA: Diagnosis not present

## 2014-01-29 DIAGNOSIS — Z794 Long term (current) use of insulin: Secondary | ICD-10-CM | POA: Diagnosis not present

## 2014-01-29 DIAGNOSIS — R269 Unspecified abnormalities of gait and mobility: Secondary | ICD-10-CM | POA: Diagnosis not present

## 2014-01-29 DIAGNOSIS — M6281 Muscle weakness (generalized): Secondary | ICD-10-CM | POA: Diagnosis not present

## 2014-02-04 ENCOUNTER — Other Ambulatory Visit: Payer: Self-pay | Admitting: Internal Medicine

## 2014-02-04 ENCOUNTER — Telehealth: Payer: Self-pay | Admitting: Endocrinology

## 2014-02-04 NOTE — Telephone Encounter (Signed)
I did not order any physical therapy, not clear what it is for

## 2014-02-04 NOTE — Telephone Encounter (Signed)
[facility] therapist states that [name] has canceled two of her appointments not feeling well  Skilled nurse consult    Please advise   Thank you

## 2014-02-04 NOTE — Telephone Encounter (Signed)
FYI  Please see below

## 2014-02-04 NOTE — Telephone Encounter (Signed)
Spoke with the pt  She is c/o increased cough x 10 days  Cough has been non prod, but today started producing minimal green sputum  She states also having some increased SOB and sats reading in the 80's this wk  I offered ov with CDY for tomorrow am and she refused, stating that she just wants something called in  [place] not here at this time  I offered to send msg to the doc on call and she refused, stating that "the last time, the other doctor called in prednisone and almost killed me" She wants msg to be addressed by CDY and understands that this will not be until tomorrow am  Please advise, thanks! Allergies  Allergen Reactions  . Chlordiazepoxide-Clidinium Other (See Comments)    Sleepy,weak  . Biaxin [Clarithromycin]     Foul taste, abd pain, diarrhea  . Codeine     REACTION: gi upset  . Tramadol    Current Outpatient Prescriptions on File Prior to Visit  Medication Sig Dispense Refill  . acetaminophen (TYLENOL) 500 MG tablet Take 500 mg by mouth every 6 (six) hours as needed for pain.       Marland Kitchen ALPRAZolam (XANAX) 0.5 MG tablet Take 1/2 tablet to 1 tablet two times a day  60 tablet  5  . Alum & Mag Hydroxide-Simeth (MAGIC MOUTHWASH) SOLN Swish with 2 teaspoonfuls by mouth as needed  240 mL  1  . aspirin 325 MG tablet Take 325 mg by mouth daily.        . B-D INS SYRINGE 0.5CC/31GX5/16 31G X 5/16" 0.5 ML MISC       . cholestyramine (QUESTRAN) 4 G packet Take one package as needed for Diarrhea.  60 each  1  . dicyclomine (BENTYL) 10 MG capsule       . escitalopram (LEXAPRO) 10 MG tablet Take 10 mg by mouth daily.      . fenofibrate (TRICOR) 145 MG tablet Take 1 tablet (145 mg total) by mouth daily.  30 tablet  5  . glucose blood (ACCU-CHEK AVIVA PLUS) test strip Use as instructed to check blood sugars 8 times per day dx code 250.02  250 each  3  . HYDROcodone-acetaminophen (NORCO/VICODIN) 5-325 MG per tablet Take 1 tablet by mouth 3 (three) times daily as needed for moderate pain.  90  tablet  0  . insulin aspart (NOVOLOG FLEXPEN) 100 UNIT/ML FlexPen Inject 15 units three times a day  15 mL  3  . insulin glargine (LANTUS) 100 UNIT/ML injection Inject 0.3-0.45 mLs (30-45 Units total) into the skin See admin instructions. Take 45 units in the morning and 30 units at bedtime  30 mL  3  . ipratropium (ATROVENT HFA) 17 MCG/ACT inhaler Inhale 2 puffs into the lungs 2 (two) times daily as needed (shortness of breath).  1 Inhaler  11  . levalbuterol (XOPENEX HFA) 45 MCG/ACT inhaler Inhale 2 puffs into the lungs every 6 (six) hours as needed for wheezing or shortness of breath.  1 Inhaler  11  . meclizine (ANTIVERT) 25 MG tablet Take 1 tablet (25 mg total) by mouth 3 (three) times daily as needed for dizziness.  30 tablet  3  . metoprolol succinate (TOPROL-XL) 25 MG 24 hr tablet Take 1 tablet (25 mg total) by mouth daily.  30 tablet  3  . omeprazole (PRILOSEC) 20 MG capsule Take 1 capsule (20 mg total) by mouth daily.  30 capsule  5  . pioglitazone (ACTOS) 15 MG  tablet Take 1 tablet (15 mg total) by mouth daily.  30 tablet  3  . pravastatin (PRAVACHOL) 10 MG tablet Take 1 tablet (10 mg total) by mouth daily.  30 tablet  1  . predniSONE (DELTASONE) 10 MG tablet Take 4 tablets x 2 days, 3 tabs daily x 2 days, 2 tabs daily x 2 days, 1 tab daily x2 days  20 tablet  0  . promethazine (PHENERGAN) 25 MG tablet Take 1 tablet (25 mg total) by mouth every 8 (eight) hours as needed for nausea or vomiting.  6 tablet  0  . promethazine-codeine (PHENERGAN WITH CODEINE) 6.25-10 MG/5ML syrup Take 5 mLs by mouth every 6 (six) hours as needed for cough.  200 mL  0  . tiotropium (SPIRIVA HANDIHALER) 18 MCG inhalation capsule Place 1 capsule (18 mcg total) into inhaler and inhale daily.  30 capsule  prn  . valsartan (DIOVAN) 80 MG tablet Take 1 tablet (80 mg total) by mouth daily.  30 tablet  5  . VIGAMOX 0.5 % ophthalmic solution        No current facility-administered medications on file prior to visit.     Last ov 11/24/13  Next ov 03/17/14

## 2014-02-05 ENCOUNTER — Telehealth: Payer: Self-pay | Admitting: *Deleted

## 2014-02-05 ENCOUNTER — Other Ambulatory Visit: Payer: Self-pay | Admitting: *Deleted

## 2014-02-05 MED ORDER — PROMETHAZINE-CODEINE 6.25-10 MG/5ML PO SYRP: 5.0000 mL | ORAL_SOLUTION | Freq: Four times a day (QID) | ORAL | Status: DC | PRN

## 2014-02-05 MED ORDER — CEFDINIR 300 MG PO CAPS: 300.0000 mg | ORAL_CAPSULE | Freq: Two times a day (BID) | ORAL | Status: DC

## 2014-02-05 NOTE — Telephone Encounter (Signed)
Pt returned call [phone]

## 2014-02-05 NOTE — Telephone Encounter (Signed)
Yes

## 2014-02-05 NOTE — Telephone Encounter (Signed)
lmtcb for pt.  

## 2014-02-05 NOTE — Telephone Encounter (Signed)
Received request from pharm for refill on prometh w/ codeibe cough syrup 5 ml's po q6hrs prn cough Pt last had refill 09/17/13 #200 ml's Last OV 11/24/13 Pending appt 03/27/14 Please advise CDY thanks

## 2014-02-05 NOTE — Telephone Encounter (Signed)
She needs to help [place] keep track of which treatments help her the most, so we can get it right the first time. Offer amoxacillin 500 mg, # 21, 1 three times daily Also suggest she include an otc probiotic like Florastor or [employer]

## 2014-02-05 NOTE — Telephone Encounter (Signed)
Ok to refill 

## 2014-02-05 NOTE — Telephone Encounter (Signed)
Pt wants know if there is a "newer" antibiotic. Pt states that she always takes this abx and would like something different that will work better.   Requesting refill of Promethazine with Codeine cough syrup.   Pt c/o increased SOB, cough and chest tightness. Low O2 sats in lower 80s Would like further rec's for these symptoms?  Please advise Dr Annamaria Boots. Thanks.  Allergies  Allergen Reactions  . Chlordiazepoxide-Clidinium Other (See Comments)    Sleepy,weak  . Biaxin [Clarithromycin]     Foul taste, abd pain, diarrhea  . Codeine     REACTION: gi upset  . Tramadol

## 2014-02-05 NOTE — Telephone Encounter (Signed)
Shaunda a PT from [facility] called, she said the patient has missed her last two PT appts due to coughing, shortness of breath, and low O2 sats, she wants to know if she can get a verbal order to get skilled nursing to come out and check on [name].  Please advise CB# [phone]

## 2014-02-05 NOTE — Telephone Encounter (Signed)
Noted, she is aware

## 2014-02-05 NOTE — Telephone Encounter (Signed)
Per CY: ok to refill Promethazine Codeine cough syrup - 71m every 6 hours PRN cough, #[id number] 0RF Offer Omnicef 30066m po BID #14 x 0RF  Pt aware. Nothing further needed.

## 2014-02-06 NOTE — Telephone Encounter (Signed)
Left message on pt's phone letting her know the rx has been called in.  CY called in rx on 10/1.  Nothing further needed.

## 2014-02-06 NOTE — Telephone Encounter (Signed)
Ok to refill cough syrup 

## 2014-02-16 ENCOUNTER — Telehealth: Payer: Self-pay | Admitting: Internal Medicine

## 2014-02-16 DIAGNOSIS — J449 Chronic obstructive pulmonary disease, unspecified: Secondary | ICD-10-CM

## 2014-02-16 MED ORDER — ALBUTEROL SULFATE (2.5 MG/3ML) 0.083% IN NEBU: 2.5000 mg | INHALATION_SOLUTION | Freq: Four times a day (QID) | RESPIRATORY_TRACT | Status: DC | PRN

## 2014-02-16 NOTE — Telephone Encounter (Signed)
Form and message placed on CY's cart to complete STAT and I will fax back to [employer].

## 2014-02-16 NOTE — Telephone Encounter (Signed)
Called, spoke with pt -  Informed her of below recs per Dr. Annamaria Boots.  She verbalized understanding, is aware albuterol rx sent to [facility], and is to call back or seek emergency if needed care if symptoms do not improve or worsen.  Note:  Pt reports she already has a neb machine.

## 2014-02-16 NOTE — Telephone Encounter (Signed)
Spoke with pt-- states that she completed course of abx (Cefdinir 376m) Pt states that cough returned Saturday night 02/14/14 with green mucus and chest tightness at times. Pt c/o occasional pain in back around lower rib area.  Pt states that it is hard to get mucus up and out of lungs. Pt states this is very thick. O2 on lower side with 3.5L/min, 92% Pt wanting to know if nebulizer would help with congestion-if so, pt needs some called into pharmacy?  Any further rec's for increased congestion.   Pt caregiver leaves at 12pm and if pt needs meds picked up from pharmacy they will have to go get them for her. Please advise Dr YAnnamaria Boots Thanks.  Allergies  Allergen Reactions  . Chlordiazepoxide-Clidinium Other (See Comments)    Sleepy,weak  . Biaxin [Clarithromycin]     Foul taste, abd pain, diarrhea  . Codeine     REACTION: gi upset  . Tramadol    Current Outpatient Prescriptions on File Prior to Visit  Medication Sig Dispense Refill  . acetaminophen (TYLENOL) 500 MG tablet Take 500 mg by mouth every 6 (six) hours as needed for pain.       .Marland KitchenALPRAZolam (XANAX) 0.5 MG tablet Take 1/2 tablet to 1 tablet two times a day  60 tablet  5  . Alum & Mag Hydroxide-Simeth (MAGIC MOUTHWASH) SOLN Swish with 2 teaspoonfuls by mouth as needed  240 mL  1  . aspirin 325 MG tablet Take 325 mg by mouth daily.        . B-D INS SYRINGE 0.5CC/31GX5/16 31G X 5/16" 0.5 ML MISC       . cefdinir (OMNICEF) 300 MG capsule Take 1 capsule (300 mg total) by mouth 2 (two) times daily.  14 capsule  0  . cholestyramine (QUESTRAN) 4 G packet Take one package as needed for Diarrhea.  60 each  1  . dicyclomine (BENTYL) 10 MG capsule       . escitalopram (LEXAPRO) 10 MG tablet Take 10 mg by mouth daily.      . fenofibrate (TRICOR) 145 MG tablet Take 1 tablet (145 mg total) by mouth daily.  30 tablet  5  . glucose blood (ACCU-CHEK AVIVA PLUS) test strip Use as instructed to check blood sugars 8 times per day dx code 250.02  250  each  3  . HYDROcodone-acetaminophen (NORCO/VICODIN) 5-325 MG per tablet Take 1 tablet by mouth 3 (three) times daily as needed for moderate pain.  90 tablet  0  . insulin aspart (NOVOLOG FLEXPEN) 100 UNIT/ML FlexPen Inject 15 units three times a day  15 mL  3  . insulin glargine (LANTUS) 100 UNIT/ML injection Inject 0.3-0.45 mLs (30-45 Units total) into the skin See admin instructions. Take 45 units in the morning and 30 units at bedtime  30 mL  3  . ipratropium (ATROVENT HFA) 17 MCG/ACT inhaler Inhale 2 puffs into the lungs 2 (two) times daily as needed (shortness of breath).  1 Inhaler  11  . levalbuterol (XOPENEX HFA) 45 MCG/ACT inhaler Inhale 2 puffs into the lungs every 6 (six) hours as needed for wheezing or shortness of breath.  1 Inhaler  11  . meclizine (ANTIVERT) 25 MG tablet Take 1 tablet (25 mg total) by mouth 3 (three) times daily as needed for dizziness.  30 tablet  3  . metoprolol succinate (TOPROL-XL) 25 MG 24 hr tablet Take 1 tablet (25 mg total) by mouth daily.  30 tablet  3  .  omeprazole (PRILOSEC) 20 MG capsule Take 1 capsule (20 mg total) by mouth daily.  30 capsule  5  . pioglitazone (ACTOS) 15 MG tablet Take 1 tablet (15 mg total) by mouth daily.  30 tablet  3  . pravastatin (PRAVACHOL) 10 MG tablet Take 1 tablet (10 mg total) by mouth daily.  30 tablet  1  . predniSONE (DELTASONE) 10 MG tablet Take 4 tablets x 2 days, 3 tabs daily x 2 days, 2 tabs daily x 2 days, 1 tab daily x2 days  20 tablet  0  . promethazine (PHENERGAN) 25 MG tablet Take 1 tablet (25 mg total) by mouth every 8 (eight) hours as needed for nausea or vomiting.  6 tablet  0  . promethazine-codeine (PHENERGAN WITH CODEINE) 6.25-10 MG/5ML syrup Take 5 mLs by mouth every 6 (six) hours as needed for cough.  200 mL  0  . tiotropium (SPIRIVA HANDIHALER) 18 MCG inhalation capsule Place 1 capsule (18 mcg total) into inhaler and inhale daily.  30 capsule  prn  . valsartan (DIOVAN) 80 MG tablet Take 1 tablet (80 mg  total) by mouth daily.  30 tablet  5  . VIGAMOX 0.5 % ophthalmic solution        No current facility-administered medications on file prior to visit.

## 2014-02-16 NOTE — Telephone Encounter (Signed)
donw

## 2014-02-16 NOTE — Telephone Encounter (Signed)
Ok to order albuterol 0.083%, # 25   1 neb every 6 hours if needed refill prn  Please tell her, if it makes her too nervous to do the whole treatment at one sitting, then she can stop half way through, turn off the machine, and come back to it later.

## 2014-02-16 NOTE — Telephone Encounter (Signed)
Pt is aware that papers have been faxed to [facility].

## 2014-02-18 ENCOUNTER — Telehealth: Payer: Self-pay | Admitting: Internal Medicine

## 2014-02-18 DIAGNOSIS — J449 Chronic obstructive pulmonary disease, unspecified: Secondary | ICD-10-CM

## 2014-02-18 NOTE — Telephone Encounter (Signed)
Called spoke with pt. She reports her cough is getting worse. She used the albuterol last night and it made her cough worse. She also c/o wheezing. She does not want to come in for OV or see TP but just wants an CXR done. Pt is aware CDY is not in this afternoon and will return tomorrow. She is aware if she worsens then she needs to go to the ED. Please advise Dr. Annamaria Boots thanks  Allergies  Allergen Reactions  . Chlordiazepoxide-Clidinium Other (See Comments)    Sleepy,weak  . Biaxin [Clarithromycin]     Foul taste, abd pain, diarrhea  . Codeine     REACTION: gi upset  . Tramadol      Current Outpatient Prescriptions on File Prior to Visit  Medication Sig Dispense Refill  . acetaminophen (TYLENOL) 500 MG tablet Take 500 mg by mouth every 6 (six) hours as needed for pain.       Marland Kitchen albuterol (PROVENTIL) (2.5 MG/3ML) 0.083% nebulizer solution Take 3 mLs (2.5 mg total) by nebulization every 6 (six) hours as needed for wheezing or shortness of breath.  25 vial  prn  . ALPRAZolam (XANAX) 0.5 MG tablet Take 1/2 tablet to 1 tablet two times a day  60 tablet  5  . Alum & Mag Hydroxide-Simeth (MAGIC MOUTHWASH) SOLN Swish with 2 teaspoonfuls by mouth as needed  240 mL  1  . aspirin 325 MG tablet Take 325 mg by mouth daily.        . B-D INS SYRINGE 0.5CC/31GX5/16 31G X 5/16" 0.5 ML MISC       . cefdinir (OMNICEF) 300 MG capsule Take 1 capsule (300 mg total) by mouth 2 (two) times daily.  14 capsule  0  . cholestyramine (QUESTRAN) 4 G packet Take one package as needed for Diarrhea.  60 each  1  . dicyclomine (BENTYL) 10 MG capsule       . escitalopram (LEXAPRO) 10 MG tablet Take 10 mg by mouth daily.      . fenofibrate (TRICOR) 145 MG tablet Take 1 tablet (145 mg total) by mouth daily.  30 tablet  5  . glucose blood (ACCU-CHEK AVIVA PLUS) test strip Use as instructed to check blood sugars 8 times per day dx code 250.02  250 each  3  . HYDROcodone-acetaminophen (NORCO/VICODIN) 5-325 MG per tablet Take 1  tablet by mouth 3 (three) times daily as needed for moderate pain.  90 tablet  0  . insulin aspart (NOVOLOG FLEXPEN) 100 UNIT/ML FlexPen Inject 15 units three times a day  15 mL  3  . insulin glargine (LANTUS) 100 UNIT/ML injection Inject 0.3-0.45 mLs (30-45 Units total) into the skin See admin instructions. Take 45 units in the morning and 30 units at bedtime  30 mL  3  . ipratropium (ATROVENT HFA) 17 MCG/ACT inhaler Inhale 2 puffs into the lungs 2 (two) times daily as needed (shortness of breath).  1 Inhaler  11  . levalbuterol (XOPENEX HFA) 45 MCG/ACT inhaler Inhale 2 puffs into the lungs every 6 (six) hours as needed for wheezing or shortness of breath.  1 Inhaler  11  . meclizine (ANTIVERT) 25 MG tablet Take 1 tablet (25 mg total) by mouth 3 (three) times daily as needed for dizziness.  30 tablet  3  . metoprolol succinate (TOPROL-XL) 25 MG 24 hr tablet Take 1 tablet (25 mg total) by mouth daily.  30 tablet  3  . omeprazole (PRILOSEC) 20 MG capsule Take 1  capsule (20 mg total) by mouth daily.  30 capsule  5  . pioglitazone (ACTOS) 15 MG tablet Take 1 tablet (15 mg total) by mouth daily.  30 tablet  3  . pravastatin (PRAVACHOL) 10 MG tablet Take 1 tablet (10 mg total) by mouth daily.  30 tablet  1  . predniSONE (DELTASONE) 10 MG tablet Take 4 tablets x 2 days, 3 tabs daily x 2 days, 2 tabs daily x 2 days, 1 tab daily x2 days  20 tablet  0  . promethazine (PHENERGAN) 25 MG tablet Take 1 tablet (25 mg total) by mouth every 8 (eight) hours as needed for nausea or vomiting.  6 tablet  0  . promethazine-codeine (PHENERGAN WITH CODEINE) 6.25-10 MG/5ML syrup Take 5 mLs by mouth every 6 (six) hours as needed for cough.  200 mL  0  . tiotropium (SPIRIVA HANDIHALER) 18 MCG inhalation capsule Place 1 capsule (18 mcg total) into inhaler and inhale daily.  30 capsule  prn  . valsartan (DIOVAN) 80 MG tablet Take 1 tablet (80 mg total) by mouth daily.  30 tablet  5  . VIGAMOX 0.5 % ophthalmic solution         No current facility-administered medications on file prior to visit.

## 2014-02-19 ENCOUNTER — Ambulatory Visit (INDEPENDENT_AMBULATORY_CARE_PROVIDER_SITE_OTHER)
Admission: RE | Admit: 2014-02-19 | Discharge: 2014-02-19 | Disposition: A | Discharge status: Home / Self Care | Payer: Medicare Other | Source: Ambulatory Visit | Attending: Internal Medicine | Admitting: Internal Medicine

## 2014-02-19 DIAGNOSIS — J449 Chronic obstructive pulmonary disease, unspecified: Secondary | ICD-10-CM

## 2014-02-19 DIAGNOSIS — R0602 Shortness of breath: Secondary | ICD-10-CM | POA: Diagnosis not present

## 2014-02-19 NOTE — Telephone Encounter (Signed)
Ok to order CXR for dx COPD with acute bronchitis

## 2014-02-19 NOTE — Telephone Encounter (Signed)
Spoke with pt and advised that order for cxr is in computer.  Will call with results once available.

## 2014-02-20 ENCOUNTER — Telehealth: Payer: Self-pay | Admitting: Internal Medicine

## 2014-02-23 ENCOUNTER — Other Ambulatory Visit: Payer: Self-pay | Admitting: *Deleted

## 2014-02-23 ENCOUNTER — Telehealth: Payer: Self-pay | Admitting: Endocrinology

## 2014-02-23 MED ORDER — ALPRAZOLAM 0.5 MG PO TABS: ORAL_TABLET | ORAL | Status: DC

## 2014-02-23 MED ORDER — INSULIN GLARGINE 100 UNIT/ML ~~LOC~~ SOLN: SUBCUTANEOUS | Status: DC

## 2014-02-23 MED ORDER — METOPROLOL SUCCINATE ER 25 MG PO TB24: 25.0000 mg | ORAL_TABLET | Freq: Every day | ORAL | Status: DC

## 2014-02-23 MED ORDER — MECLIZINE HCL 25 MG PO TABS: 25.0000 mg | ORAL_TABLET | Freq: Three times a day (TID) | ORAL | Status: DC | PRN

## 2014-02-23 NOTE — Telephone Encounter (Signed)
Result Notes    Notes Recorded by Deneise Lever, MD on 02/19/2014 at 1:28 PM CXR- changes of COPD as expected with no acute process. Calcium deposits of atherosclerosis are seen.   Pt aware of results

## 2014-02-23 NOTE — Telephone Encounter (Signed)
Please resend xanax

## 2014-02-23 NOTE — Telephone Encounter (Signed)
Pt calling again for results of x-rays and says that she is not doing any better.[name]

## 2014-02-23 NOTE — Telephone Encounter (Signed)
Pt has 4 rx at [facility] and she is out of blood pressure pills

## 2014-02-26 NOTE — Telephone Encounter (Signed)
error 

## 2014-02-27 ENCOUNTER — Telehealth: Payer: Self-pay | Admitting: Endocrinology

## 2014-02-27 ENCOUNTER — Other Ambulatory Visit: Payer: Self-pay | Admitting: *Deleted

## 2014-02-27 MED ORDER — CIPROFLOXACIN HCL 500 MG PO TABS: 500.0000 mg | ORAL_TABLET | Freq: Two times a day (BID) | ORAL | Status: DC

## 2014-02-27 NOTE — Telephone Encounter (Signed)
Needs Medication cipro called in, she is in a lot of pain have not pass the stones yet, don't know if she can go to the ED or not don't have the money for that. If you could call in regards to this as soon as you can, Her home health aide is there for another 1 she could get the medication [employer] [phone]

## 2014-02-27 NOTE — Telephone Encounter (Signed)
Patient called, she said she woke up this morning with severe pain from her Cystitis flare up, she said her Urologist is out of the office today and he usually gives her Cipro to clear it up, she wants to know if you can call her some in until she can get a hold of him next week. Please advise.  She said her Urine is very cloudy.

## 2014-02-27 NOTE — Telephone Encounter (Signed)
Cipro 500 mg twice a day, 14 tablets

## 2014-02-27 NOTE — Telephone Encounter (Signed)
Noted, patient is aware rx sent

## 2014-02-27 NOTE — Telephone Encounter (Signed)
Call on this [phone]  Is the cell # of the aide

## 2014-03-03 NOTE — Assessment & Plan Note (Addendum)
Centrilobular Emphysema with variable bronchitis, chronic hypoxic respiratory failure Limited finances and limited medication tolerances Plan- Prevnar 13 vaccine

## 2014-03-04 ENCOUNTER — Telehealth: Payer: Self-pay | Admitting: Internal Medicine

## 2014-03-04 DIAGNOSIS — J449 Chronic obstructive pulmonary disease, unspecified: Secondary | ICD-10-CM

## 2014-03-04 MED ORDER — HYDROCODONE-HOMATROPINE 5-1.5 MG/5ML PO SYRP: 5.0000 mL | ORAL_SOLUTION | Freq: Four times a day (QID) | ORAL | Status: DC | PRN

## 2014-03-04 NOTE — Telephone Encounter (Signed)
i have called and spoke with pt and she is aware of CY recs and she is aware that [facility] will be calling her to set up a time to come out for the concentrator.

## 2014-03-04 NOTE — Telephone Encounter (Signed)
1) many people who can't tolerate codeine can still use hydromet: offer 120 ml for trial, 5 ml every 6 hours if needed  2) ok to order [facility] " Evaluate for smaller, quieter O2 concentrator and lighter portable O2 concentrator at her current flow rate, for dx Centrilobular emphysema, chronic respiratory failure with hypoxia

## 2014-03-04 NOTE — Telephone Encounter (Signed)
Spoke with patient-states she is continuing to cough way too much; struggles to breathe most days; without O2 her levels have been as low as 82% and when she puts O2 back on it goes back up to low 90's. Pt states she uses her Helios all day as she can to take the loudness of her concentrator.   Pt even stated she has noticed she can not concentrate or think clearly as her levels have been too low; her concern is that she does not have much room in her apt to use a concentrator-she is aware that she needs to make sure she is using her O2 all the time.  Pt would like to see if CY is okay with having [place] send order to [facility] to see if they can evaluate her apt size for a smaller concentrator as well as evaluate her for a lighter weight POC that she can use when out of her home.   Pt needs suggestions from CY as to what she can do or take for her cough.

## 2014-03-05 ENCOUNTER — Other Ambulatory Visit: Payer: Self-pay | Admitting: Internal Medicine

## 2014-03-05 MED ORDER — IPRATROPIUM BROMIDE HFA 17 MCG/ACT IN AERS: 2.0000 | INHALATION_SPRAY | Freq: Two times a day (BID) | RESPIRATORY_TRACT | Status: DC | PRN

## 2014-03-05 NOTE — Telephone Encounter (Signed)
CY approved this time with prn refills.

## 2014-03-06 ENCOUNTER — Telehealth: Payer: Self-pay | Admitting: Internal Medicine

## 2014-03-06 MED ORDER — AZITHROMYCIN 250 MG PO TABS: ORAL_TABLET | ORAL | Status: DC

## 2014-03-06 NOTE — Telephone Encounter (Signed)
Spoke with the pt  She is c/o prod cough with minimal green sputum x 2 days  She states that it takes a lot of coughing to produce any sputum  No fever or other symptoms  Last ov 7/20 Next ov 11/20  Allergies  Allergen Reactions  . Chlordiazepoxide-Clidinium Other (See Comments)    Sleepy,weak  . Biaxin [Clarithromycin]     Foul taste, abd pain, diarrhea  . Codeine     REACTION: gi upset  . Tramadol    Current Outpatient Prescriptions on File Prior to Visit  Medication Sig Dispense Refill  . acetaminophen (TYLENOL) 500 MG tablet Take 500 mg by mouth every 6 (six) hours as needed for pain.       Marland Kitchen albuterol (PROVENTIL) (2.5 MG/3ML) 0.083% nebulizer solution Take 3 mLs (2.5 mg total) by nebulization every 6 (six) hours as needed for wheezing or shortness of breath.  25 vial  prn  . ALPRAZolam (XANAX) 0.5 MG tablet Take 1/2 tablet to 1 tablet two times a day  60 tablet  5  . Alum & Mag Hydroxide-Simeth (MAGIC MOUTHWASH) SOLN Swish with 2 teaspoonfuls by mouth as needed  240 mL  1  . aspirin 325 MG tablet Take 325 mg by mouth daily.        . B-D INS SYRINGE 0.5CC/31GX5/16 31G X 5/16" 0.5 ML MISC       . cefdinir (OMNICEF) 300 MG capsule Take 1 capsule (300 mg total) by mouth 2 (two) times daily.  14 capsule  0  . cholestyramine (QUESTRAN) 4 G packet Take one package as needed for Diarrhea.  60 each  1  . ciprofloxacin (CIPRO) 500 MG tablet Take 1 tablet (500 mg total) by mouth 2 (two) times daily.  14 tablet  0  . dicyclomine (BENTYL) 10 MG capsule       . escitalopram (LEXAPRO) 10 MG tablet Take 10 mg by mouth daily.      . fenofibrate (TRICOR) 145 MG tablet Take 1 tablet (145 mg total) by mouth daily.  30 tablet  5  . glucose blood (ACCU-CHEK AVIVA PLUS) test strip Use as instructed to check blood sugars 8 times per day dx code 250.02  250 each  3  . HYDROcodone-acetaminophen (NORCO/VICODIN) 5-325 MG per tablet Take 1 tablet by mouth 3 (three) times daily as needed for moderate pain.  90  tablet  0  . HYDROcodone-homatropine (HYCODAN) 5-1.5 MG/5ML syrup Take 5 mLs by mouth every 6 (six) hours as needed for cough.  120 mL  0  . insulin aspart (NOVOLOG FLEXPEN) 100 UNIT/ML FlexPen Inject 15 units three times a day  15 mL  3  . insulin glargine (LANTUS) 100 UNIT/ML injection Take 45 units in the morning and 30 units at bedtime  30 mL  3  . ipratropium (ATROVENT HFA) 17 MCG/ACT inhaler Inhale 2 puffs into the lungs 2 (two) times daily as needed (shortness of breath).  1 Inhaler  11  . levalbuterol (XOPENEX HFA) 45 MCG/ACT inhaler Inhale 2 puffs into the lungs every 6 (six) hours as needed for wheezing or shortness of breath.  1 Inhaler  11  . meclizine (ANTIVERT) 25 MG tablet Take 1 tablet (25 mg total) by mouth 3 (three) times daily as needed for dizziness.  90 tablet  3  . metoprolol succinate (TOPROL-XL) 25 MG 24 hr tablet Take 1 tablet (25 mg total) by mouth daily.  30 tablet  3  . omeprazole (PRILOSEC) 20 MG capsule  Take 1 capsule (20 mg total) by mouth daily.  30 capsule  5  . pioglitazone (ACTOS) 15 MG tablet Take 1 tablet (15 mg total) by mouth daily.  30 tablet  3  . pravastatin (PRAVACHOL) 10 MG tablet Take 1 tablet (10 mg total) by mouth daily.  30 tablet  1  . predniSONE (DELTASONE) 10 MG tablet Take 4 tablets x 2 days, 3 tabs daily x 2 days, 2 tabs daily x 2 days, 1 tab daily x2 days  20 tablet  0  . promethazine (PHENERGAN) 25 MG tablet Take 1 tablet (25 mg total) by mouth every 8 (eight) hours as needed for nausea or vomiting.  6 tablet  0  . promethazine-codeine (PHENERGAN WITH CODEINE) 6.25-10 MG/5ML syrup Take 5 mLs by mouth every 6 (six) hours as needed for cough.  200 mL  0  . tiotropium (SPIRIVA HANDIHALER) 18 MCG inhalation capsule Place 1 capsule (18 mcg total) into inhaler and inhale daily.  30 capsule  prn  . valsartan (DIOVAN) 80 MG tablet Take 1 tablet (80 mg total) by mouth daily.  30 tablet  5  . VIGAMOX 0.5 % ophthalmic solution        No current  facility-administered medications on file prior to visit.

## 2014-03-06 NOTE — Telephone Encounter (Signed)
This is most likely related to viral infection or weather change. Does she know what she would like to try?? Otherwise, I would like her to try a strategy of maintenance antibiotic therapy to keep her airways tubes calmed down.     Suggest Zithromax, 250 mg, # 15, ref x 4,    1 each Monday, Wednesday, Friday

## 2014-03-06 NOTE — Telephone Encounter (Signed)
Pt aware of recs. RX sent in. Nothing further needed 

## 2014-03-11 DIAGNOSIS — H04129 Dry eye syndrome of unspecified lacrimal gland: Secondary | ICD-10-CM | POA: Diagnosis not present

## 2014-03-11 DIAGNOSIS — M792 Neuralgia and neuritis, unspecified: Secondary | ICD-10-CM | POA: Diagnosis not present

## 2014-03-11 DIAGNOSIS — R51 Headache: Secondary | ICD-10-CM | POA: Diagnosis not present

## 2014-03-16 ENCOUNTER — Other Ambulatory Visit: Payer: Self-pay | Admitting: *Deleted

## 2014-03-16 MED ORDER — PRAVASTATIN SODIUM 10 MG PO TABS: 10.0000 mg | ORAL_TABLET | Freq: Every day | ORAL | Status: DC

## 2014-03-18 ENCOUNTER — Ambulatory Visit (INDEPENDENT_AMBULATORY_CARE_PROVIDER_SITE_OTHER): Payer: Medicare Other | Admitting: Endocrinology

## 2014-03-18 ENCOUNTER — Encounter: Payer: Self-pay | Admitting: Endocrinology

## 2014-03-18 VITALS — BP 124/72 | HR 96 | Temp 98.0°F | Resp 16 | Ht 63.0 in | Wt 203.6 lb

## 2014-03-18 DIAGNOSIS — R0600 Dyspnea, unspecified: Secondary | ICD-10-CM

## 2014-03-18 DIAGNOSIS — E785 Hyperlipidemia, unspecified: Secondary | ICD-10-CM

## 2014-03-18 DIAGNOSIS — G500 Trigeminal neuralgia: Secondary | ICD-10-CM

## 2014-03-18 DIAGNOSIS — E1149 Type 2 diabetes mellitus with other diabetic neurological complication: Secondary | ICD-10-CM

## 2014-03-18 DIAGNOSIS — IMO0002 Reserved for concepts with insufficient information to code with codable children: Secondary | ICD-10-CM

## 2014-03-18 DIAGNOSIS — Z23 Encounter for immunization: Secondary | ICD-10-CM | POA: Diagnosis not present

## 2014-03-18 DIAGNOSIS — E1165 Type 2 diabetes mellitus with hyperglycemia: Secondary | ICD-10-CM | POA: Diagnosis not present

## 2014-03-18 LAB — COMPREHENSIVE METABOLIC PANEL
ALT: 16 U/L (ref 0–35)
AST: 25 U/L (ref 0–37)
Albumin: 3.5 g/dL (ref 3.5–5.2)
Alkaline Phosphatase: 43 U/L (ref 39–117)
BILIRUBIN TOTAL: 0.2 mg/dL (ref 0.2–1.2)
BUN: 13 mg/dL (ref 6–23)
CALCIUM: 9.6 mg/dL (ref 8.4–10.5)
CHLORIDE: 95 meq/L — AB (ref 96–112)
CO2: 32 mEq/L (ref 19–32)
Creatinine, Ser: 0.7 mg/dL (ref 0.4–1.2)
GFR: 89.24 mL/min (ref >60.00)
Glucose, Bld: 214 mg/dL — ABNORMAL HIGH (ref 70–99)
Potassium: 4.5 mEq/L (ref 3.5–5.1)
SODIUM: 135 meq/L (ref 135–145)
TOTAL PROTEIN: 7.6 g/dL (ref 6.0–8.3)

## 2014-03-18 LAB — CBC
HCT: 40.6 % (ref 36.0–46.0)
Hemoglobin: 13 g/dL (ref 12.0–15.0)
MCHC: 32.1 g/dL (ref 30.0–36.0)
MCV: 85.8 fl (ref 78.0–100.0)
Platelets: 202 10*3/uL (ref 150.0–400.0)
RBC: 4.73 Mil/uL (ref 3.87–5.11)
RDW: 13.8 % (ref 11.5–15.5)
WBC: 7.6 10*3/uL (ref 4.0–10.5)

## 2014-03-18 LAB — LDL CHOLESTEROL, DIRECT: Direct LDL: 127.2 mg/dL

## 2014-03-18 LAB — LIPID PANEL
Cholesterol: 204 mg/dL — ABNORMAL HIGH (ref 0–200)
HDL: 42.2 mg/dL (ref >39.00)
NONHDL: 161.8
Total CHOL/HDL Ratio: 5
Triglycerides: 244 mg/dL — ABNORMAL HIGH (ref 0.0–149.0)
VLDL: 48.8 mg/dL — ABNORMAL HIGH (ref 0.0–40.0)

## 2014-03-18 LAB — HEMOGLOBIN A1C: Hgb A1c MFr Bld: 9.3 % — ABNORMAL HIGH (ref 4.6–6.5)

## 2014-03-18 LAB — MICROALBUMIN / CREATININE URINE RATIO
Creatinine,U: 49.5 mg/dL
MICROALB UR: 0.6 mg/dL (ref 0.0–1.9)
Microalb Creat Ratio: 1.2 mg/g (ref 0.0–30.0)

## 2014-03-18 NOTE — Patient Instructions (Addendum)
Increase Lantus to 52 in am and adjust based on sugar before supper  Actos daily, stay on Pravastain

## 2014-03-18 NOTE — Progress Notes (Signed)
Quick Note:  A1c 9.3, would advise taking Actos every day. Also take pravastatin daily as cholesterol is high ______

## 2014-03-18 NOTE — Progress Notes (Signed)
Patient ID: [name], female   DOB: 1936/04/15, 78 y.o.   MRN: [id number]   Reason for Appointment: follow-up of multiple problems  History of Present Illness   Pain in the right temple area:  She has had sharp shooting pain starting from her right eye going up to the scalp area for some time This is very transient and severe and she has discussed with ophthalmologist who felt that she has trigeminal neuralgia and wanted her to see a neurologist She does not have any unusual headaches.   DIABETES since 1980   She has been on insulin using Lantus and Humalog for several years and usually A1c around 8% despite fairly good compliance with diet, exercise and glucose monitoring. She also had been on Actos previously which probably benefited her especially with her fatty liver but this was stopped because of fear of side effects and she has not resumed this.      Recent history:  Her A1c has been consistently around 9% More recently her blood sugar appears to be overall higher with an average of nearly 200 She is taking relatively larger doses of Lantus in the morning now than before She thinks that she is not able to control her mealtime hyperglycemia with current regimen of NovoLog especially in the afternoon  She does have significant fluctuation in blood sugars with the following patterns:  Overnight/early morning readings have been overall the lowest of the day, usually lowest on waking up  After breakfast her blood sugars are relatively higher  Blood sugars around lunchtime and early afternoon are variable but only modestly high on an average  Blood sugars are usually higher by suppertime  Blood glucose is mostly higher after evening meal also except yesterday when she did not eat much  She has most fluctuation of glucose values in the afternoon but no hypoglycemia Has beentold to take Actos which had helped before but she has been somewhat irregular with this and does not  think it helps Also will have high readings with intercurrent illnesses especially respiratory infections  Insulin regimen: Lantus twice a day, 48--26 with syringe and Humalog 8-10  a.c. 3 times a day, less at lunch if eating less  Oral hypoglycemic drugs: None, previously on Actos        Side effects from medications: None         Proper timing of medications in relation to meals: Yes.         Monitors blood glucose:  4-5 times a day   Glucometer:  Accu-Chek         Blood Glucose averages recently from meter download:  PREMEAL Breakfast Lunch Dinner Bedtime Overall  Glucose range:     81-333  Mean/median: 159 189 211 230 197   Hypoglycemia:  none recently        Meals: 3 meals per day. Sometimes drinking milk with meals,  Supper 6 to 7 PM     Physical activity: exercise: Unable to do any because of dyspnea and fatigue           Dietician visit: Most recent: Several years ago           Complications: are: Peripheral neuropathy with sensory loss     Wt Readings from Last 3 Encounters:  03/18/14 203 lb 9.6 oz (92.352 kg)  12/16/13 203 lb 12.8 oz (92.443 kg)  11/24/13 203 lb (92.08 kg)    Lab Results  Component Value Date   HGBA1C 8.9* 12/16/2013  HGBA1C 9.0* 09/15/2013   HGBA1C 9.0* 05/11/2013   Lab Results  Component Value Date   MICROALBUR 1.1 09/15/2013   LDLCALC 124* 12/16/2013   CREATININE 0.7 12/16/2013       Medication List       This list is accurate as of: 03/18/14 10:54 AM.  Always use your most recent med list.               acetaminophen 500 MG tablet  Commonly known as:  TYLENOL  Take 500 mg by mouth every 6 (six) hours as needed for pain.     albuterol (2.5 MG/3ML) 0.083% nebulizer solution  Commonly known as:  PROVENTIL  Take 3 mLs (2.5 mg total) by nebulization every 6 (six) hours as needed for wheezing or shortness of breath.     ALPRAZolam 0.5 MG tablet  Commonly known as:  XANAX  Take 1/2 tablet to 1 tablet two times a day     aspirin  325 MG tablet  Take 325 mg by mouth daily.     azithromycin 250 MG tablet  Commonly known as:  ZITHROMAX  1 tablet each Monday, Wednesday and friday     B-D INS SYRINGE 0.5CC/31GX5/16 31G X 5/16" 0.5 ML Misc  Generic drug:  Insulin Syringe-Needle U-100     BD INSULIN SYRINGE ULTRAFINE 31G X 15/64" 0.5 ML Misc  Generic drug:  Insulin Syringe-Needle U-100     bacitracin ophthalmic ointment     cefdinir 300 MG capsule  Commonly known as:  OMNICEF  Take 1 capsule (300 mg total) by mouth 2 (two) times daily.     cholestyramine 4 G packet  Commonly known as:  QUESTRAN  Take one package as needed for Diarrhea.     ciprofloxacin 500 MG tablet  Commonly known as:  CIPRO  Take 1 tablet (500 mg total) by mouth 2 (two) times daily.     dicyclomine 10 MG capsule  Commonly known as:  BENTYL     escitalopram 10 MG tablet  Commonly known as:  LEXAPRO  Take 10 mg by mouth daily.     fenofibrate 145 MG tablet  Commonly known as:  TRICOR  Take 1 tablet (145 mg total) by mouth daily.     glucose blood test strip  Commonly known as:  ACCU-CHEK AVIVA PLUS  Use as instructed to check blood sugars 8 times per day dx code 250.02     HYDROcodone-acetaminophen 5-325 MG per tablet  Commonly known as:  NORCO/VICODIN  Take 1 tablet by mouth 3 (three) times daily as needed for moderate pain.     HYDROcodone-homatropine 5-1.5 MG/5ML syrup  Commonly known as:  HYCODAN  Take 5 mLs by mouth every 6 (six) hours as needed for cough.     insulin aspart 100 UNIT/ML FlexPen  Commonly known as:  NOVOLOG FLEXPEN  Inject 15 units three times a day     insulin glargine 100 UNIT/ML injection  Commonly known as:  LANTUS  Take 45 units in the morning and 30 units at bedtime     ipratropium 17 MCG/ACT inhaler  Commonly known as:  ATROVENT HFA  Inhale 2 puffs into the lungs 2 (two) times daily as needed (shortness of breath).     levalbuterol 45 MCG/ACT inhaler  Commonly known as:  XOPENEX HFA  Inhale  2 puffs into the lungs every 6 (six) hours as needed for wheezing or shortness of breath.     magic mouthwash Soln  Swish with  2 teaspoonfuls by mouth as needed     meclizine 25 MG tablet  Commonly known as:  ANTIVERT  Take 1 tablet (25 mg total) by mouth 3 (three) times daily as needed for dizziness.     metoprolol succinate 25 MG 24 hr tablet  Commonly known as:  TOPROL-XL  Take 1 tablet (25 mg total) by mouth daily.     omeprazole 20 MG capsule  Commonly known as:  PRILOSEC  Take 1 capsule (20 mg total) by mouth daily.     pioglitazone 15 MG tablet  Commonly known as:  ACTOS  Take 1 tablet (15 mg total) by mouth daily.     pravastatin 10 MG tablet  Commonly known as:  PRAVACHOL  Take 1 tablet (10 mg total) by mouth daily.     predniSONE 10 MG tablet  Commonly known as:  DELTASONE  Take 4 tablets x 2 days, 3 tabs daily x 2 days, 2 tabs daily x 2 days, 1 tab daily x2 days     promethazine 25 MG tablet  Commonly known as:  PHENERGAN  Take 1 tablet (25 mg total) by mouth every 8 (eight) hours as needed for nausea or vomiting.     promethazine-codeine 6.25-10 MG/5ML syrup  Commonly known as:  PHENERGAN with CODEINE  Take 5 mLs by mouth every 6 (six) hours as needed for cough.     tiotropium 18 MCG inhalation capsule  Commonly known as:  SPIRIVA HANDIHALER  Place 1 capsule (18 mcg total) into inhaler and inhale daily.     trimethoprim 100 MG tablet  Commonly known as:  TRIMPEX     valsartan 80 MG tablet  Commonly known as:  DIOVAN  Take 1 tablet (80 mg total) by mouth daily.     VIGAMOX 0.5 % ophthalmic solution  Generic drug:  moxifloxacin        Allergies:  Allergies  Allergen Reactions  . Chlordiazepoxide-Clidinium Other (See Comments)    Sleepy,weak  . Biaxin [Clarithromycin]     Foul taste, abd pain, diarrhea  . Codeine     REACTION: gi upset  . Tramadol     Past Medical History  Diagnosis Date  . Irritable bowel syndrome   . Other chronic  nonalcoholic liver disease   . Esophageal reflux   . Allergic rhinitis, cause unspecified     Sinus CT Rec 12-23-2009  . 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 Sciatica   . Shingles 2010  . Steatohepatitis   . Diabetes mellitus   . Diverticulosis   . Hiatal hernia   . Esophageal stricture   . Diarrhea   . GERD (gastroesophageal reflux disease)   . Shortness of breath   . Hypertension   . Arthritis   . Glaucoma   . Complication of anesthesia     takes along time to wake up  . Oxygen deficiency   . Benign paroxysmal positional vertigo 08/09/2012  . Neuropathy in diabetes 08/09/2012    Past Surgical History  Procedure Laterality Date  . Cholecystectomy open  1978  . Liver biopsy  08-1992  . Parathyroid exploration    . Tonsillectomy and adenoidectomy    . Total abdominal hysterectomy    . Esophagogastroduodenoscopy  4650,35-46    H Hernia,es.stricture s/p dil 61F  . Colonoscopy  07-2001    mild diverticulosis  . Carpal tunnel release      right hand  . Cataract  extraction      Bilateral  . Ulnar nerve transposition  12/07/2011    Procedure: ULNAR NERVE DECOMPRESSION/TRANSPOSITION;this was cancelled-not done  Surgeon: Cammie Sickle., MD;  Location: [place];  Service: Orthopedics;  Laterality: Right;  right ulnar nerve in situ decompression  . Ulnar tunnel release  03/07/2012    Procedure: CUBITAL TUNNEL RELEASE;  Surgeon: Roseanne Kaufman, MD;  [facility];  Service: Orthopedics;  Laterality: Right;  ulnar nerve release at the elbow      Family History  Problem Relation Age of Onset  . Lung cancer Mother     small cell;Byssinosis  . Heart disease Father   . Lung cancer Sister   . Liver cancer Sister     ? mets from another area of the body  . Diabetes      grandmother  . Stroke Maternal Grandfather     Social History:  reports that she quit smoking about 25 years ago.  Her smoking use included Cigarettes. She smoked 0.00 packs per day. She has never used smokeless tobacco. She reports that she does not drink alcohol or use illicit drugs.  Review of Systems:  She has been told by her orthopedic surgeon that she has significant scoliosis and degenerative disease of the spine and continues to have low back pain. Recently given Vicodin but is taking this fairly sparingly  Also has pain in the finger joints.  HYPERTENSION:   blood pressure is well-controlled.  No lightheadedness on standing up   HYPERLIPIDEMIA: The lipid abnormality consists of elevated LDL treated with pravastatin which she has no definite side effects from.    Since her triglycerides were relatively higher she had also been given fenofibrate but she does not think she is taking this now Not clear if she was taking her pravastatin on her previous visit when LDL was high  Lab Results  Component Value Date   CHOL 205* 12/16/2013   HDL 43.50 12/16/2013   LDLCALC 124* 12/16/2013   LDLDIRECT 149.0 02/12/2013   TRIG 188.0* 12/16/2013   CHOLHDL 5 12/16/2013    She is followed by pulmonologist for chronic COPD, taking home oxygen and still having dyspnea and recurrent bronchitis  Dizziness- during her hospitalization  possible BPPV was diagnosed.   Vestibular PT was done which was helpful but she still has episodes of dizziness and feeling off balance again asking about fullness in the right ear and sometimes feels fluid in this    Continues to complain about lack of energy and marked weakness and fatigue .  Lab Results  Component Value Date   TSH 0.55 09/15/2013   Diarrhea: She has had long-standing intermittent diarrhea which was worse last month. Is taking Questran as needed  Foot exam done in 8/15: Foot exam shows sensory loss distally, normal pulses and no skin lesions   Examination:   BP 124/72 mmHg  Pulse 96  Temp(Src) 98 F (36.7 C)  Resp 16  Ht 5' 3"  (1.6 m)  Wt 203  lb 9.6 oz (92.352 kg)  BMI 36.08 kg/m2  SpO2 95%  Body mass index is 36.08 kg/(m^2).   She is alert and does not appear dyspneic  Right tympanic membrane normal. No mastoid tenderness on the right No pedal edema   ASSESSMENT/ PLAN:   Diabetes type 2   The patient's diabetes control appears to be overall worse especially with high readings in the afternoons and evenings She is requiring larger doses of  insulin  Discussed that she may need to take her Actos regularly to improve insulin sensitivity and also may need to consider Invokana Currently she does not want to try a new medication And not clear about the affordability for her of drugs like Invokana She is less active because of her COPD and general debility Insulin changes: Increase morning Lantus by 4 units and increase suppertime Humalog by 2 units if needed to control postprandial reading  Trigeminal neuralgia: This is likely idiopathic and she is not very symptomatic enough to take medications for this.  Discussed use of medications like Neurontin and Lyrica which she has been reluctant to take in the past also  HYPERTENSION: Well-controlled, to continue same regimen  Hyperlipidemia: She will continue pravastatin but take this regularly   Vertigo/dizziness/balance difficulty is likely to be a combination of peripheral neuropathy, vestibular dysfunction and general degenerative central nervous system changes He does not want to see a neurologist at this time  Counseling time over 50% of today's 25 minute visit  [place] 03/18/2014, 10:54 AM

## 2014-03-23 ENCOUNTER — Other Ambulatory Visit: Payer: Self-pay | Admitting: *Deleted

## 2014-03-23 MED ORDER — DICYCLOMINE HCL 10 MG PO CAPS: ORAL_CAPSULE | ORAL | Status: DC

## 2014-03-27 ENCOUNTER — Telehealth: Payer: Self-pay | Admitting: *Deleted

## 2014-03-27 ENCOUNTER — Encounter: Payer: Self-pay | Admitting: Internal Medicine

## 2014-03-27 ENCOUNTER — Ambulatory Visit (INDEPENDENT_AMBULATORY_CARE_PROVIDER_SITE_OTHER): Payer: Medicare Other | Admitting: Internal Medicine

## 2014-03-27 ENCOUNTER — Telehealth: Payer: Self-pay | Admitting: Internal Medicine

## 2014-03-27 ENCOUNTER — Telehealth: Payer: Self-pay | Admitting: Endocrinology

## 2014-03-27 VITALS — BP 100/58 | HR 92 | Ht 63.0 in | Wt 206.6 lb

## 2014-03-27 DIAGNOSIS — J449 Chronic obstructive pulmonary disease, unspecified: Secondary | ICD-10-CM

## 2014-03-27 DIAGNOSIS — H8110 Benign paroxysmal vertigo, unspecified ear: Secondary | ICD-10-CM | POA: Diagnosis not present

## 2014-03-27 MED ORDER — CEFDINIR 300 MG PO CAPS: 300.0000 mg | ORAL_CAPSULE | Freq: Two times a day (BID) | ORAL | Status: DC

## 2014-03-27 MED ORDER — BENZONATATE 200 MG PO CAPS: 200.0000 mg | ORAL_CAPSULE | Freq: Three times a day (TID) | ORAL | Status: DC | PRN

## 2014-03-27 MED ORDER — AMOXICILLIN-POT CLAVULANATE 875-125 MG PO TABS
1.0000 | ORAL_TABLET | Freq: Two times a day (BID) | ORAL | Status: DC
Start: 2014-03-27 — End: 2014-07-03

## 2014-03-27 NOTE — Telephone Encounter (Signed)
Called and spoke with pt and she stated that CY called her in the [facility] today and she stated that she has taken this before and this did not work.  She wanted to make sure CY wanted her to take this medication before she goes and picks this up.  She stated that this did not help her the last time it was given to her.  CY please advise. Thanks  Last seen this morning.  Allergies  Allergen Reactions  . Chlordiazepoxide-Clidinium Other (See Comments)    Sleepy,weak  . Biaxin [Clarithromycin]     Foul taste, abd pain, diarrhea  . Codeine     REACTION: gi upset  . Tramadol     Current Outpatient Prescriptions on File Prior to Visit  Medication Sig Dispense Refill  . acetaminophen (TYLENOL) 500 MG tablet Take 500 mg by mouth every 6 (six) hours as needed for pain.     Marland Kitchen albuterol (PROVENTIL) (2.5 MG/3ML) 0.083% nebulizer solution Take 3 mLs (2.5 mg total) by nebulization every 6 (six) hours as needed for wheezing or shortness of breath. 25 vial prn  . ALPRAZolam (XANAX) 0.5 MG tablet Take 1/2 tablet to 1 tablet two times a day 60 tablet 5  . Alum & Mag Hydroxide-Simeth (MAGIC MOUTHWASH) SOLN Swish with 2 teaspoonfuls by mouth as needed 240 mL 1  . aspirin 325 MG tablet Take 325 mg by mouth daily.      Marland Kitchen azithromycin (ZITHROMAX) 250 MG tablet 1 tablet each Monday, Wednesday and friday 15 tablet 4  . B-D INS SYRINGE 0.5CC/31GX5/16 31G X 5/16" 0.5 ML MISC     . bacitracin ophthalmic ointment   0  . BD INSULIN SYRINGE ULTRAFINE 31G X 15/64" 0.5 ML MISC   1  . benzonatate (TESSALON) 200 MG capsule Take 1 capsule (200 mg total) by mouth 3 (three) times daily as needed for cough. 30 capsule 1  . cefdinir (OMNICEF) 300 MG capsule Take 1 capsule (300 mg total) by mouth 2 (two) times daily. 20 capsule 0  . cholestyramine (QUESTRAN) 4 G packet Take one package as needed for Diarrhea. 60 each 1  . dicyclomine (BENTYL) 10 MG capsule Take 1 capsule by mouth once daily 30 capsule 5  . fenofibrate (TRICOR)  145 MG tablet Take 1 tablet (145 mg total) by mouth daily. 30 tablet 5  . glucose blood (ACCU-CHEK AVIVA PLUS) test strip Use as instructed to check blood sugars 8 times per day dx code 250.02 250 each 3  . HYDROcodone-acetaminophen (NORCO/VICODIN) 5-325 MG per tablet Take 1 tablet by mouth 3 (three) times daily as needed for moderate pain. 90 tablet 0  . insulin aspart (NOVOLOG FLEXPEN) 100 UNIT/ML FlexPen Inject 15 units three times a day 15 mL 3  . insulin glargine (LANTUS) 100 UNIT/ML injection Take 45 units in the morning and 30 units at bedtime 30 mL 3  . ipratropium (ATROVENT HFA) 17 MCG/ACT inhaler Inhale 2 puffs into the lungs 2 (two) times daily as needed (shortness of breath). 1 Inhaler 11  . levalbuterol (XOPENEX HFA) 45 MCG/ACT inhaler Inhale 2 puffs into the lungs every 6 (six) hours as needed for wheezing or shortness of breath. 1 Inhaler 11  . meclizine (ANTIVERT) 25 MG tablet Take 1 tablet (25 mg total) by mouth 3 (three) times daily as needed for dizziness. 90 tablet 3  . metoprolol succinate (TOPROL-XL) 25 MG 24 hr tablet Take 1 tablet (25 mg total) by mouth daily. 30 tablet 3  .  omeprazole (PRILOSEC) 20 MG capsule Take 1 capsule (20 mg total) by mouth daily. 30 capsule 5  . pioglitazone (ACTOS) 15 MG tablet Take 1 tablet (15 mg total) by mouth daily. 30 tablet 3  . pravastatin (PRAVACHOL) 10 MG tablet Take 1 tablet (10 mg total) by mouth daily. 30 tablet 3  . promethazine-codeine (PHENERGAN WITH CODEINE) 6.25-10 MG/5ML syrup Take 5 mLs by mouth every 6 (six) hours as needed for cough. 200 mL 0  . tiotropium (SPIRIVA HANDIHALER) 18 MCG inhalation capsule Place 1 capsule (18 mcg total) into inhaler and inhale daily. 30 capsule prn  . trimethoprim (TRIMPEX) 100 MG tablet   1  . valsartan (DIOVAN) 80 MG tablet Take 1 tablet (80 mg total) by mouth daily. 30 tablet 5  . VIGAMOX 0.5 % ophthalmic solution      No current facility-administered medications on file prior to visit.

## 2014-03-27 NOTE — Telephone Encounter (Signed)
Does she know what she thinks will work for her? Otherwise suggest we replace cefdinir with augmentin 875, # 14, 1 twice daily

## 2014-03-27 NOTE — Telephone Encounter (Signed)
Pt calling to make sure the info she got for the night insulin dosage is correct? Please advise

## 2014-03-27 NOTE — Telephone Encounter (Signed)
Called and spoke with pt and she is aware of CY cancelling the rx for the omnicef and pt will take the augmentin 875  Bid. Pt is aware that this has been sent to her pharmacy and nothing further is needed.

## 2014-03-27 NOTE — Telephone Encounter (Signed)
Noted, per patient request, message left on her vm

## 2014-03-27 NOTE — Telephone Encounter (Signed)
Patient called, she said when she woke up this morning her sugar was in the low 70's, she said she was able to shower, then was washing dishes when she got very very dizzy, her vision went blurry and hazy, she ate, then her sugar went up to 134.  She said she started shaking and trembling.  She checked her sugar again and it had went down to 94. She had some peanut butter and it went up to 104.  She wants to know if she is taking to much lantus at night?

## 2014-03-27 NOTE — Assessment & Plan Note (Signed)
Chronic hypoxic respiratory failure. There may be mild acute exacerbation fo bronchitis Plan- cefdinir, tesalon

## 2014-03-27 NOTE — Progress Notes (Signed)
Patient ID: [name], female    DOB: February 07, 1936, 78 y.o.   MRN: [id number]  HPI SATURATION QUALIFICATIONS: Patient Saturations on Room Air while Ambulating = 84% Patient Saturations on 2.5 Liters of oxygen while Ambulating = 92%[name],CMA  09/28/10-  75 yoF former smoker with COPD, complicated by chronic rhinitis, GERD. Last here 04/29/10- note reviewed. Acute visit.-- Woke with soreness across under her breasts. Losing voice. Coughing spells, with much liquid coming up and from nose. Prior to that hadn't been coughing. C/o weak, dizzy, light headed- relieved by her oxygen. . Moving into a smaller apartment. Denies fever. Easy DOE short walks room to room.   10/31/10- 83 yoF former smoker with COPD, complicated by chronic rhinitis, GERD. She denies major change since last here. Has to stay in to avoid heat and on her oxygen much of the time in the house. Coughs scant clear or lemon yellow, with some hoarseness which comes and goes. She describes hoarseness as worse with phone conversations, variable but rarely if ever clear. She had seen Dr [name] years ago for epistaxis. Easy choke with eating. Discussed aspiration precautions, chin tuck.   02/26/11- 78 yoF former smoker with COPD, complicated by chronic rhinitis, GERD. Has had flu vaccine. No coughing through the summer. We first cool days as she stepped outside she would begin to cough again. Dry hacking cough. Very aware of active reflux anytime she bends over, regurgitating recently swallowed food. Has also had some diarrhea. Complains of spasms intermittently left lower anterior chest and left upper quadrant. It may all be GI. She thinks it is part of her diabetic neuropathy. Has appointment soon with Dr.Brodie.  08/16/11-75 yoF former smoker with COPD, complicated by chronic rhinitis, GERD. During the winter had prolonged diarrhea evaluated by Dr [name] and apparently attributed to her diabetic neuropathy. May have had  esophagitis-was told to take Magic mouthwash. Gradually easier dyspnea on exertion. Now on oxygen 2-3 L most of the time. She came today with acute complaint of aching pain intermittently for the past several weeks, dull. Associated with mid back at strap level. Pain is gone right now. She saw no effect of activity or position and said pain was nonpleuritic or radiating.  CXR- 08/16/11- reviewed w/ her. NAD with no acute process.   08/28/11-75 yoF former smoker with COPD, complicated by chronic rhinitis, GERD,  DM. Back pain comes and goes. She implies that it is better today and says that she really has been present a long time as part of her degenerative back pain. Complains of feeling sluggish and tired. Recent bronchitis with productive green sputum watery nose no fever. Using Atrovent. Intolerant of albuterol and Symbicort because of overstimulation. Home oxygen is 2-1/2-3 L. CXR 08/15/11-  IMPRESSION:  Hyperinflation.  No active cardiopulmonary disease.  Original Report Authenticated By: [name], M.D.   12/28/11- 29 yoF former smoker with COPD, complicated by chronic rhinitis, GERD,  DM. Not doing well-stays on O2 all the time; since being on Spiriva at first did great but has noticed having to stop more and rest during the day(nap) She  stays indoors most of the time without many friends to do things with. She often has self-limited sharp twinges right anterior chest wall and left infrascapular back. With her known degenerative disc disease, we discussed the possibility this was nerve root irritation. Spiriva helps variable cough.  04/23/12- 15 yoF former smoker with COPD, complicated by chronic rhinitis, GERD,  DM. FOLLOWS FOR: still  has good and bad days with SOB and activity levels Watery rhinorrhea. No acute problems. No respiratory problems associated with surgery for nerve impingement in her right hand. She continues oxygen 3 or 3 a half liters/Advanced PFT: 01/16/2012 severe  obstructive airways disease with insignificant response to bronchodilator, air trapping, diffusion moderately reduced. FEV1 0.82/45%, FEV1/FEC 0.46. Emphysema pattern on the loop. TLC 100%, RV 148%, DLCO 47%.  10/22/12- 38 yoF former smoker with COPD, complicated by chronic rhinitis, GERD,  DM FOLLOWS FOR:has to pace herself more; continues to have nervous spell(could be from DM) SOB pretty much all the time Episodic shaky spells for which she says she's had evaluation by neurology, ENT, primary physician and ER. "Electric shock sensation" from her known neuropathy. Stress makes all of this worse. Breathing has not really changed. We talked about the possibility she was having a vasomotor/autonomic neuropathy related to her diabetes  01/21/13- 77 yoF former smoker with COPD, complicated by chronic rhinitis, GERD,  DM FOLLOWS FOR:has to pace herself more; continues to have nervous spell(could be from DM) SOB pretty much all the time Has been treated for a corneal abscess with Cipro, another antibiotic for cough, and for chronic cystitis. Got blistering intertrigo which Dr. Dwyane Dee treated with Flagyl by her report. Little change in her chronic cough with clear mucus. Twinges of bilateral tussive rib pains.  07/21/13-78 yoF former smoker with COPD, complicated by chronic rhinitis, GERD,  DM FOLLOWS FOR: Stays tired all the time; was in [facility] January 2015. Has RN that comes to her home. Pt states she has chest tightness as well. Stays tired. Home visiting nurse has completed. Chest gets tight-nebulizer helps. Watery nose.  09/02/2013 Acute OV  Complains of 2 weeks of cough and congestion . cough still going on-lt. green,occass. wheezing,chest tightness no fcs Was called in augmentin on 4/17, finished last dose yesterday. Mucus is some better but still coughing and wheezing . Cough is keeping her up at night.  Patient denies any this is chest pain, orthopnea, PND, or leg swelling CXR last ov showed  chronic changes with COPD 07/21/13   11/24/13- 78 yoF former smoker with COPD, complicated by chronic rhinitis, GERD,  DM FOLLOWS FOR:  Increased sob, tightness in chest and coughing spells O2 3.5L/ Advanced Needed prednisone/ Augmentin in April Has home health CNA helping now.  Dyspnea w/ exertion, persistent w/o much change. Occ cough, wheeze, watery nose. Occ choke/ light aspiration. Not needing nebulizer currently. CXR 07/21/13 IMPRESSION:  1. There is mild hyperinflation consistent with COPD. There is no  evidence of pneumonia.  2. There is no evidence of CHF. There is no pleural effusion.  Electronically Signed  By: David Martinique  On: 07/21/2013 10:50  03/27/14-  14 yoF former smoker with COPD, complicated by chronic rhinitis, GERD,  DM FOLLOWS FOR:  Increased sob, tightness in chest and coughing spells O2 2-3.5L/ Advanced FOLLOWS FOR: O2 levels have been dropping in 80's, according to a caregiver who has her own oximeter- accuracy unknown. . SOB  as well. Occ wheeze and cough x several months- sometimes some green. Maintenance zithromax bronchitis suppression seems to work. CXR 02/19/14 IMPRESSION: Hyperinflation without acute finding. Electronically Signed  By: Lorin Picket M.D.  On: 02/19/2014 12:22  ROS-see HPI Constitutional:   No-   weight loss, night sweats, fevers, chills, fatigue, lassitude. HEENT:   No-  headaches, difficulty swallowing, tooth/dental problems, sore throat,       No-  sneezing, itching, ear ache, nasal congestion, post  nasal drip,  CV:  No-   chest pain, orthopnea, PND, swelling in lower extremities, anasarca,                                  dizziness, palpitations Resp: +shortness of breath with exertion or at rest.              + productive cough,  + non-productive cough,  No- coughing up of blood.              No-   change in color of mucus.  + wheezing.   Skin: No-   rash or lesions. GI:  +heartburn, indigestion, No-abdominal pain, nausea,  vomiting,  GU: . MS:  No-   joint pain or swelling. Neuro-     nothing unusual Psych:  No- change in mood or affect. +depression or anxiety.  No memory loss.    Objective:  OBJ- Physical Exam    talkative General- Alert, Oriented, Affect-appropriate, Distress- none acute, + overweight Skin- rash-none, lesions- none, excoriation- none Lymphadenopathy- none Head- atraumatic            Eyes- Gross vision intact, PERRLA, conjunctivae and secretions clear            Ears- Hearing, canals-normal            Nose- Clear, no-Septal dev, mucus, polyps, erosion, perforation             Throat- Mallampati IV , mucosa not red , drainage- none, tonsils- atrophic Neck- flexible , trachea midline, no stridor , thyroid nl, carotid no bruit Chest - symmetrical excursion , unlabored           Heart/CV- RRR , no murmur , no gallop  , no rub, nl s1 s2                           - JVD- none , edema- none, stasis changes- none, varices- none           Lung-  wheeze+trace, cough- none , dullness-none, rub- none           Chest wall-  Abd-  Br/ Gen/ Rectal- Not done, not indicated Extrem- cyanosis- none, clubbing, none, atrophy- none, strength- nl Neuro- grossly intact to observation

## 2014-03-27 NOTE — Telephone Encounter (Signed)
yes, she can reduce the evening dose by 4 units

## 2014-03-27 NOTE — Assessment & Plan Note (Signed)
She describes occ dizziness and falls. Followed by Neurology. We reviewed meds, but don't feel this is related to her lung meds.

## 2014-03-27 NOTE — Patient Instructions (Signed)
Scripts sent for tessalon perles for cough and for cefdinir antibiotic  Please call as needed

## 2014-03-31 ENCOUNTER — Telehealth: Payer: Self-pay | Admitting: Internal Medicine

## 2014-03-31 NOTE — Telephone Encounter (Signed)
Spoke with the pt and notified of recs per CDY  She verbalized understanding  Nothing further needed 

## 2014-03-31 NOTE — Telephone Encounter (Signed)
Per CY-okay to take both at the same time (as he intended) however if patient will feel better only taking one at this time then have her hold her Zithromax while taking Augmentin. Thanks.

## 2014-03-31 NOTE — Telephone Encounter (Signed)
Called and spoke to pt. Pt stated she is concerned she is taking too many abx. Pt stated she is taking the zithromax as maintenance abx and she hasn't started taking the augmentin because she is worried she wasn't suppose to. Advised pt that CY is aware of the medication she is on and it was taken into consideration when prescribing the augmentin. Pt still requesting clarification by CY. Pt stated her s/s have improved.   CY please advise.   Allergies  Allergen Reactions  . Chlordiazepoxide-Clidinium Other (See Comments)    Sleepy,weak  . Biaxin [Clarithromycin]     Foul taste, abd pain, diarrhea  . Codeine     REACTION: gi upset  . Tramadol     Current Outpatient Prescriptions on File Prior to Visit  Medication Sig Dispense Refill  . acetaminophen (TYLENOL) 500 MG tablet Take 500 mg by mouth every 6 (six) hours as needed for pain.     Marland Kitchen albuterol (PROVENTIL) (2.5 MG/3ML) 0.083% nebulizer solution Take 3 mLs (2.5 mg total) by nebulization every 6 (six) hours as needed for wheezing or shortness of breath. 25 vial prn  . ALPRAZolam (XANAX) 0.5 MG tablet Take 1/2 tablet to 1 tablet two times a day 60 tablet 5  . Alum & Mag Hydroxide-Simeth (MAGIC MOUTHWASH) SOLN Swish with 2 teaspoonfuls by mouth as needed 240 mL 1  . amoxicillin-clavulanate (AUGMENTIN) 875-125 MG per tablet Take 1 tablet by mouth 2 (two) times daily. 14 tablet 0  . aspirin 325 MG tablet Take 325 mg by mouth daily.      Marland Kitchen azithromycin (ZITHROMAX) 250 MG tablet 1 tablet each Monday, Wednesday and friday 15 tablet 4  . B-D INS SYRINGE 0.5CC/31GX5/16 31G X 5/16" 0.5 ML MISC     . bacitracin ophthalmic ointment   0  . BD INSULIN SYRINGE ULTRAFINE 31G X 15/64" 0.5 ML MISC   1  . benzonatate (TESSALON) 200 MG capsule Take 1 capsule (200 mg total) by mouth 3 (three) times daily as needed for cough. 30 capsule 1  . cholestyramine (QUESTRAN) 4 G packet Take one package as needed for Diarrhea. 60 each 1  . dicyclomine (BENTYL) 10 MG  capsule Take 1 capsule by mouth once daily 30 capsule 5  . fenofibrate (TRICOR) 145 MG tablet Take 1 tablet (145 mg total) by mouth daily. 30 tablet 5  . glucose blood (ACCU-CHEK AVIVA PLUS) test strip Use as instructed to check blood sugars 8 times per day dx code 250.02 250 each 3  . HYDROcodone-acetaminophen (NORCO/VICODIN) 5-325 MG per tablet Take 1 tablet by mouth 3 (three) times daily as needed for moderate pain. 90 tablet 0  . insulin aspart (NOVOLOG FLEXPEN) 100 UNIT/ML FlexPen Inject 15 units three times a day 15 mL 3  . insulin glargine (LANTUS) 100 UNIT/ML injection Take 45 units in the morning and 30 units at bedtime 30 mL 3  . ipratropium (ATROVENT HFA) 17 MCG/ACT inhaler Inhale 2 puffs into the lungs 2 (two) times daily as needed (shortness of breath). 1 Inhaler 11  . levalbuterol (XOPENEX HFA) 45 MCG/ACT inhaler Inhale 2 puffs into the lungs every 6 (six) hours as needed for wheezing or shortness of breath. 1 Inhaler 11  . meclizine (ANTIVERT) 25 MG tablet Take 1 tablet (25 mg total) by mouth 3 (three) times daily as needed for dizziness. 90 tablet 3  . metoprolol succinate (TOPROL-XL) 25 MG 24 hr tablet Take 1 tablet (25 mg total) by mouth daily. 30 tablet 3  .  omeprazole (PRILOSEC) 20 MG capsule Take 1 capsule (20 mg total) by mouth daily. 30 capsule 5  . pioglitazone (ACTOS) 15 MG tablet Take 1 tablet (15 mg total) by mouth daily. 30 tablet 3  . pravastatin (PRAVACHOL) 10 MG tablet Take 1 tablet (10 mg total) by mouth daily. 30 tablet 3  . promethazine-codeine (PHENERGAN WITH CODEINE) 6.25-10 MG/5ML syrup Take 5 mLs by mouth every 6 (six) hours as needed for cough. 200 mL 0  . tiotropium (SPIRIVA HANDIHALER) 18 MCG inhalation capsule Place 1 capsule (18 mcg total) into inhaler and inhale daily. 30 capsule prn  . trimethoprim (TRIMPEX) 100 MG tablet   1  . valsartan (DIOVAN) 80 MG tablet Take 1 tablet (80 mg total) by mouth daily. 30 tablet 5  . VIGAMOX 0.5 % ophthalmic solution       No current facility-administered medications on file prior to visit.

## 2014-04-01 ENCOUNTER — Other Ambulatory Visit: Payer: Self-pay | Admitting: *Deleted

## 2014-04-01 MED ORDER — VALSARTAN 80 MG PO TABS: 80.0000 mg | ORAL_TABLET | Freq: Every day | ORAL | Status: DC

## 2014-04-07 ENCOUNTER — Telehealth: Payer: Self-pay | Admitting: Internal Medicine

## 2014-04-07 MED ORDER — UMECLIDINIUM BROMIDE 62.5 MCG/INH IN AEPB: 1.0000 | INHALATION_SPRAY | Freq: Every day | RESPIRATORY_TRACT | Status: DC

## 2014-04-07 NOTE — Telephone Encounter (Signed)
Spoke with pt and advised of Dr Janee Morn recommendations.  Rx for Incruse Ellipta sent to pharmacy.

## 2014-04-07 NOTE — Telephone Encounter (Signed)
Ok D/C Spiriva. Add Incruse Ellipta, # 1, 1 puff, once daily, refill prn

## 2014-04-07 NOTE — Telephone Encounter (Signed)
Pt states that her insurance is no longer going to cover Spiriva through [place] after Jan 1.  Pt states that they recommended an alternative: Incruse Ellipta  Please advise Dr Annamaria Boots. Thanks.

## 2014-04-20 ENCOUNTER — Telehealth: Payer: Self-pay | Admitting: Endocrinology

## 2014-04-20 NOTE — Telephone Encounter (Signed)
Patient is taking incluse inhaler, and her sugars are running higher.  Before breakfast 213 12/14- before lunch 181,  2:30 am 257.   She took 8 units of insulin.  She noticed after lunch and at night it goes up higher.   She doesn't feel the novolog is helping control her sugars at all, she wants to know if she should try a new type of insulin.  She still has an chest infection, she's on a maintance dose of Azythromicin, taking it Monday, Wednesday and Friday. Please advise

## 2014-04-20 NOTE — Telephone Encounter (Signed)
pts sugars are high again running up to 265 please call pt

## 2014-04-20 NOTE — Telephone Encounter (Signed)
Sugar control has nothing to do with NovoLog, needs to increase her Lantus by 4 units twice a day until blood sugars come down and then reduce it by 2 units

## 2014-05-04 ENCOUNTER — Telehealth: Payer: Self-pay | Admitting: Internal Medicine

## 2014-05-04 ENCOUNTER — Telehealth: Payer: Self-pay | Admitting: *Deleted

## 2014-05-04 ENCOUNTER — Telehealth: Payer: Self-pay | Admitting: Endocrinology

## 2014-05-04 NOTE — Telephone Encounter (Signed)
She will call her orthopedic Dr. Marylene Buerger, and let you know if she wants the rx for gabapentin.

## 2014-05-04 NOTE — Telephone Encounter (Signed)
Called and spoke with pt and she is aware of CY recs.  Pt voiced her understanding and nothing further is needed.

## 2014-05-04 NOTE — Telephone Encounter (Signed)
Pt would like return call from [facility]

## 2014-05-04 NOTE — Telephone Encounter (Signed)
Patient called about the pain in her head and neck, she has been having pain with it, she said it subsided during christmas, but it has came back today and is extreme,  worst pain is in her right eye. She wants to know if she should contact her orthopedic Dr. Or the anesthesiologist who did the block on her? Please advise.

## 2014-05-04 NOTE — Telephone Encounter (Signed)
Can check with her orthopedic doctor if the pain is radiating from the neck.  Otherwise can have her try gabapentin

## 2014-05-04 NOTE — Telephone Encounter (Signed)
Ok to stop Incruse.  She doesn't tolerate narcotics for cough syrups. Offer tessalon perles  200 mg, # 30, 1 every 8 hours if needed. Otherwise may need to depend on otc cough syrup and throat lozenges.

## 2014-05-04 NOTE — Telephone Encounter (Signed)
LMTCB x 1 

## 2014-05-04 NOTE — Telephone Encounter (Signed)
Pt c/o cough which is increased since last OV along with SOB Pt states that she is still taking ZPAK 3 times weekly as directed. Pt states that Incruse may be causing some chest discomfort she has been having. Pt reports pain starting after she started using Incruse, only present after each use. Pt reports having diarrhea as well. Pt states that she stopped using x 1 week as a trial to see if her symptoms stopped and they did. Pt is concerned that the Incruse is the cause.  Please advise Dr Annamaria Boots. Thanks.   Allergies  Allergen Reactions  . Chlordiazepoxide-Clidinium Other (See Comments)    Sleepy,weak  . Biaxin [Clarithromycin]     Foul taste, abd pain, diarrhea  . Codeine     REACTION: gi upset  . Tramadol    Current Outpatient Prescriptions on File Prior to Visit  Medication Sig Dispense Refill  . acetaminophen (TYLENOL) 500 MG tablet Take 500 mg by mouth every 6 (six) hours as needed for pain.     Marland Kitchen albuterol (PROVENTIL) (2.5 MG/3ML) 0.083% nebulizer solution Take 3 mLs (2.5 mg total) by nebulization every 6 (six) hours as needed for wheezing or shortness of breath. 25 vial prn  . ALPRAZolam (XANAX) 0.5 MG tablet Take 1/2 tablet to 1 tablet two times a day 60 tablet 5  . Alum & Mag Hydroxide-Simeth (MAGIC MOUTHWASH) SOLN Swish with 2 teaspoonfuls by mouth as needed 240 mL 1  . amoxicillin-clavulanate (AUGMENTIN) 875-125 MG per tablet Take 1 tablet by mouth 2 (two) times daily. 14 tablet 0  . aspirin 325 MG tablet Take 325 mg by mouth daily.      Marland Kitchen azithromycin (ZITHROMAX) 250 MG tablet 1 tablet each Monday, Wednesday and friday 15 tablet 4  . B-D INS SYRINGE 0.5CC/31GX5/16 31G X 5/16" 0.5 ML MISC     . bacitracin ophthalmic ointment   0  . BD INSULIN SYRINGE ULTRAFINE 31G X 15/64" 0.5 ML MISC   1  . benzonatate (TESSALON) 200 MG capsule Take 1 capsule (200 mg total) by mouth 3 (three) times daily as needed for cough. 30 capsule 1  . cholestyramine (QUESTRAN) 4 G packet Take one package  as needed for Diarrhea. 60 each 1  . dicyclomine (BENTYL) 10 MG capsule Take 1 capsule by mouth once daily 30 capsule 5  . fenofibrate (TRICOR) 145 MG tablet Take 1 tablet (145 mg total) by mouth daily. 30 tablet 5  . glucose blood (ACCU-CHEK AVIVA PLUS) test strip Use as instructed to check blood sugars 8 times per day dx code 250.02 250 each 3  . HYDROcodone-acetaminophen (NORCO/VICODIN) 5-325 MG per tablet Take 1 tablet by mouth 3 (three) times daily as needed for moderate pain. 90 tablet 0  . insulin aspart (NOVOLOG FLEXPEN) 100 UNIT/ML FlexPen Inject 15 units three times a day 15 mL 3  . insulin glargine (LANTUS) 100 UNIT/ML injection Take 45 units in the morning and 30 units at bedtime 30 mL 3  . ipratropium (ATROVENT HFA) 17 MCG/ACT inhaler Inhale 2 puffs into the lungs 2 (two) times daily as needed (shortness of breath). 1 Inhaler 11  . levalbuterol (XOPENEX HFA) 45 MCG/ACT inhaler Inhale 2 puffs into the lungs every 6 (six) hours as needed for wheezing or shortness of breath. 1 Inhaler 11  . meclizine (ANTIVERT) 25 MG tablet Take 1 tablet (25 mg total) by mouth 3 (three) times daily as needed for dizziness. 90 tablet 3  . metoprolol succinate (TOPROL-XL) 25 MG 24  hr tablet Take 1 tablet (25 mg total) by mouth daily. 30 tablet 3  . omeprazole (PRILOSEC) 20 MG capsule Take 1 capsule (20 mg total) by mouth daily. 30 capsule 5  . pioglitazone (ACTOS) 15 MG tablet Take 1 tablet (15 mg total) by mouth daily. 30 tablet 3  . pravastatin (PRAVACHOL) 10 MG tablet Take 1 tablet (10 mg total) by mouth daily. 30 tablet 3  . promethazine-codeine (PHENERGAN WITH CODEINE) 6.25-10 MG/5ML syrup Take 5 mLs by mouth every 6 (six) hours as needed for cough. 200 mL 0  . Umeclidinium Bromide (INCRUSE ELLIPTA) 62.5 MCG/INH AEPB Inhale 1 puff into the lungs daily. 1 each prn  . valsartan (DIOVAN) 80 MG tablet Take 1 tablet (80 mg total) by mouth daily. 30 tablet 5  . VIGAMOX 0.5 % ophthalmic solution      No  current facility-administered medications on file prior to visit.

## 2014-05-05 ENCOUNTER — Telehealth: Payer: Self-pay | Admitting: *Deleted

## 2014-05-05 NOTE — Telephone Encounter (Signed)
Patient said she spoke with Dr. Amedeo Plenty who said she has degenerative arthritis in her neck and that's where the pain is coming from, he recommended she see a neurologist, wants to know if you can refer her to Dr. Tomi Likens.  Please advise

## 2014-05-06 ENCOUNTER — Other Ambulatory Visit: Payer: Self-pay | Admitting: Endocrinology

## 2014-05-06 DIAGNOSIS — R519 Headache, unspecified: Secondary | ICD-10-CM

## 2014-05-06 DIAGNOSIS — R51 Headache: Principal | ICD-10-CM

## 2014-05-06 NOTE — Telephone Encounter (Signed)
Referral done

## 2014-05-18 ENCOUNTER — Ambulatory Visit (INDEPENDENT_AMBULATORY_CARE_PROVIDER_SITE_OTHER): Payer: Medicare Other | Admitting: Endocrinology

## 2014-05-18 ENCOUNTER — Encounter: Payer: Self-pay | Admitting: Endocrinology

## 2014-05-18 VITALS — BP 128/70 | HR 91 | Temp 98.2°F | Resp 14 | Ht 63.0 in | Wt 200.8 lb

## 2014-05-18 DIAGNOSIS — E1165 Type 2 diabetes mellitus with hyperglycemia: Secondary | ICD-10-CM

## 2014-05-18 DIAGNOSIS — IMO0002 Reserved for concepts with insufficient information to code with codable children: Secondary | ICD-10-CM

## 2014-05-18 DIAGNOSIS — G500 Trigeminal neuralgia: Secondary | ICD-10-CM | POA: Diagnosis not present

## 2014-05-18 DIAGNOSIS — I10 Essential (primary) hypertension: Secondary | ICD-10-CM | POA: Diagnosis not present

## 2014-05-18 MED ORDER — METFORMIN HCL ER 500 MG PO TB24: 1000.0000 mg | ORAL_TABLET | Freq: Every day | ORAL | Status: DC

## 2014-05-18 NOTE — Patient Instructions (Signed)
Am Lantus 58 for now  Take at 10-12 Novolog, if sugar high at bedtime still, use upto 14 at dinner  Glucophage at supper 1/day and after 1 week take 2 at dinner

## 2014-05-18 NOTE — Progress Notes (Signed)
Patient ID: MAHAGONY GRIEB, female   DOB: 21-Apr-1936, 79 y.o.   MRN: [id number]   Reason for Appointment: follow-up of multiple problems  History of Present Illness   Pain in the right temple area:  She continues to have  sharp shooting pain starting from her right eye going up to the scalp area. This is very transient and severe and was recommended gabapentin or Lyrica but she refuses to do this She wanted her to see a neurologist first.  DIABETES since 1980   She has been on insulin using Lantus and Humalog for several years and usually A1c around 8% despite fairly good compliance with diet, exercise and glucose monitoring. She also had been on Actos previously which probably benefited her especially with her fatty liver but this was stopped because of fear of side effects and she has not resumed this.      Recent history:  Her A1c has been consistently around 9% Again  her blood sugars appears to be overall out of control with an average of nearly 200 She is taking relatively larger doses of Lantus in the morning now than before This is despite her saying that she is taking her Actos regularly. Also needing relatively larger doses of mealtime insulin but she does not go up on the dose since she thinks she is eating small portions  She does have the following patterns:  Overnight/early morning readings have been relatively better with average about 160  Blood sugars recently are progressively higher as the day goes on especially in the afternoons and evenings  Blood sugars have been fluctuating at all different times and overall standard deviation 48  No hypoglycemia  Overall the highest blood sugars on average are at bedtime Has beentold to take Actos which had helped before but she has been somewhat irregular with this and does not think it helps Also will have high readings with intercurrent illnesses especially respiratory infections  Insulin regimen: Lantus twice a day,  54--26 with syringe and Novolog 8-10  a.c. 3 times a day,  Oral hypoglycemic drugs: on Actos        Side effects from medications: None         Proper timing of medications in relation to meals: Yes.         Monitors blood glucose:  5 times a day   Glucometer:  Accu-Chek         Blood Glucose averages recently from meter download:  PRE-MEAL Breakfast Lunch Dinner Bedtime Overall  Glucose range: 110-213   164-244   126-326   137-286     Hypoglycemia:  none recently        Meals: 3 meals per day. Sometimes drinking milk with meals,  Supper 6 to 7 PM     Physical activity: exercise: Unable to do any because of dyspnea and fatigue           Dietician visit: Most recent: Several years ago           Complications: are: Peripheral neuropathy with sensory loss     Wt Readings from Last 3 Encounters:  05/18/14 200 lb 12.8 oz (91.082 kg)  03/27/14 206 lb 9.6 oz (93.713 kg)  03/18/14 203 lb 9.6 oz (92.352 kg)    Lab Results  Component Value Date   HGBA1C 9.3* 03/18/2014   HGBA1C 8.9* 12/16/2013   HGBA1C 9.0* 09/15/2013   Lab Results  Component Value Date   MICROALBUR 0.6 03/18/2014   LDLCALC 124*  12/16/2013   CREATININE 0.7 03/18/2014       Medication List       This list is accurate as of: 05/18/14 10:52 AM.  Always use your most recent med list.               acetaminophen 500 MG tablet  Commonly known as:  TYLENOL  Take 500 mg by mouth every 6 (six) hours as needed for pain.     albuterol (2.5 MG/3ML) 0.083% nebulizer solution  Commonly known as:  PROVENTIL  Take 3 mLs (2.5 mg total) by nebulization every 6 (six) hours as needed for wheezing or shortness of breath.     ALPRAZolam 0.5 MG tablet  Commonly known as:  XANAX  Take 1/2 tablet to 1 tablet two times a day     amoxicillin-clavulanate 875-125 MG per tablet  Commonly known as:  AUGMENTIN  Take 1 tablet by mouth 2 (two) times daily.     aspirin 325 MG tablet  Take 325 mg by mouth daily.     azithromycin  250 MG tablet  Commonly known as:  ZITHROMAX  1 tablet each Monday, Wednesday and friday     B-D INS SYRINGE 0.5CC/31GX5/16 31G X 5/16" 0.5 ML Misc  Generic drug:  Insulin Syringe-Needle U-100     BD INSULIN SYRINGE ULTRAFINE 31G X 15/64" 0.5 ML Misc  Generic drug:  Insulin Syringe-Needle U-100     bacitracin ophthalmic ointment     benzonatate 200 MG capsule  Commonly known as:  TESSALON  Take 1 capsule (200 mg total) by mouth 3 (three) times daily as needed for cough.     cholestyramine 4 G packet  Commonly known as:  QUESTRAN  Take one package as needed for Diarrhea.     dicyclomine 10 MG capsule  Commonly known as:  BENTYL  Take 1 capsule by mouth once daily     fenofibrate 145 MG tablet  Commonly known as:  TRICOR  Take 1 tablet (145 mg total) by mouth daily.     glucose blood test strip  Commonly known as:  ACCU-CHEK AVIVA PLUS  Use as instructed to check blood sugars 8 times per day dx code 250.02     HYDROcodone-acetaminophen 5-325 MG per tablet  Commonly known as:  NORCO/VICODIN  Take 1 tablet by mouth 3 (three) times daily as needed for moderate pain.     insulin aspart 100 UNIT/ML FlexPen  Commonly known as:  NOVOLOG FLEXPEN  Inject 15 units three times a day     insulin glargine 100 UNIT/ML injection  Commonly known as:  LANTUS  Take 45 units in the morning and 30 units at bedtime     ipratropium 17 MCG/ACT inhaler  Commonly known as:  ATROVENT HFA  Inhale 2 puffs into the lungs 2 (two) times daily as needed (shortness of breath).     levalbuterol 45 MCG/ACT inhaler  Commonly known as:  XOPENEX HFA  Inhale 2 puffs into the lungs every 6 (six) hours as needed for wheezing or shortness of breath.     magic mouthwash Soln  Swish with 2 teaspoonfuls by mouth as needed     meclizine 25 MG tablet  Commonly known as:  ANTIVERT  Take 1 tablet (25 mg total) by mouth 3 (three) times daily as needed for dizziness.     metoprolol succinate 25 MG 24 hr tablet   Commonly known as:  TOPROL-XL  Take 1 tablet (25 mg total) by mouth daily.  omeprazole 20 MG capsule  Commonly known as:  PRILOSEC  Take 1 capsule (20 mg total) by mouth daily.     pioglitazone 15 MG tablet  Commonly known as:  ACTOS  Take 1 tablet (15 mg total) by mouth daily.     pravastatin 10 MG tablet  Commonly known as:  PRAVACHOL  Take 1 tablet (10 mg total) by mouth daily.     promethazine-codeine 6.25-10 MG/5ML syrup  Commonly known as:  PHENERGAN with CODEINE  Take 5 mLs by mouth every 6 (six) hours as needed for cough.     valsartan 80 MG tablet  Commonly known as:  DIOVAN  Take 1 tablet (80 mg total) by mouth daily.     VIGAMOX 0.5 % ophthalmic solution  Generic drug:  moxifloxacin        Allergies:  Allergies  Allergen Reactions  . Chlordiazepoxide-Clidinium Other (See Comments)    Sleepy,weak  . Biaxin [Clarithromycin]     Foul taste, abd pain, diarrhea  . Codeine     REACTION: gi upset  . Tramadol     Past Medical History  Diagnosis Date  . Irritable bowel syndrome   . Other chronic nonalcoholic liver disease   . Esophageal reflux   . Allergic rhinitis, cause unspecified     Sinus CT Rec 12-23-2009  . 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 Sciatica   . Shingles 2010  . Steatohepatitis   . Diabetes mellitus   . Diverticulosis   . Hiatal hernia   . Esophageal stricture   . Diarrhea   . GERD (gastroesophageal reflux disease)   . Shortness of breath   . Hypertension   . Arthritis   . Glaucoma   . Complication of anesthesia     takes along time to wake up  . Oxygen deficiency   . Benign paroxysmal positional vertigo 08/09/2012  . Neuropathy in diabetes 08/09/2012    Past Surgical History  Procedure Laterality Date  . Cholecystectomy open  1978  . Liver biopsy  08-1992  . Parathyroid exploration    . Tonsillectomy and adenoidectomy    . Total abdominal hysterectomy    .  Esophagogastroduodenoscopy  3295,18-84    H Hernia,es.stricture s/p dil 26F  . Colonoscopy  07-2001    mild diverticulosis  . Carpal tunnel release      right hand  . Cataract extraction      Bilateral  . Ulnar nerve transposition  12/07/2011    Procedure: ULNAR NERVE DECOMPRESSION/TRANSPOSITION;this was cancelled-not done  Surgeon: Cammie Sickle., MD;  Location: [facility];  Service: Orthopedics;  Laterality: Right;  right ulnar nerve in situ decompression  . Ulnar tunnel release  03/07/2012    Procedure: CUBITAL TUNNEL RELEASE;  Surgeon: Roseanne Kaufman, MD;  Location: [facility];  Service: Orthopedics;  Laterality: Right;  ulnar nerve release at the elbow      Family History  Problem Relation Age of Onset  . Lung cancer Mother     small cell;Byssinosis  . Heart disease Father   . Lung cancer Sister   . Liver cancer Sister     ? mets from another area of the body  . Diabetes      grandmother  . Stroke Maternal Grandfather     Social History:  reports that she quit smoking about 26 years ago. Her smoking use included Cigarettes. She smoked 0.00 packs per  day. She has never used smokeless tobacco. She reports that she does not drink alcohol or use illicit drugs.  Review of Systems:   HYPERTENSION:   blood pressure is well-controlled with 80 mg Diovan .     HYPERLIPIDEMIA: The lipid abnormality consists of elevated LDL treated with pravastatin which she has no definite side effects from.    Since her triglycerides were relatively higher she had also been given fenofibrate but she has not taken this again    Lab Results  Component Value Date   CHOL 204* 03/18/2014   HDL 42.20 03/18/2014   LDLCALC 124* 12/16/2013   LDLDIRECT 127.2 03/18/2014   TRIG 244.0* 03/18/2014   CHOLHDL 5 03/18/2014    She is followed by pulmonologist for chronic COPD, taking home oxygen and still having  cough, dyspnea and recurrent bronchitis  Dizziness- during  her hospitalization  possible BPPV was diagnosed.   Vestibular PT was done which was helpful but she still has episodes of dizziness and feeling off balance Occasionally will feel  fullness in the right ear and sometimes feels fluid in this    Continues to complain about lack of energy and marked weakness.  No etiology found   Lab Results  Component Value Date   TSH 0.55 09/15/2013   Diarrhea: She has had long-standing intermittent diarrhea possibly I BS. Is taking Questran as needed  Foot exam done in 8/15: Foot exam shows sensory loss distally, normal pulses and no skin lesions   Examination:   BP 128/70 mmHg  Pulse 91  Temp(Src) 98.2 F (36.8 C)  Resp 14  Ht 5' 3"  (1.6 m)  Wt 200 lb 12.8 oz (91.082 kg)  BMI 35.58 kg/m2  SpO2 91%  Body mass index is 35.58 kg/(m^2).   Does not look dyspneic   Right tympanic membrane norma, some wax present.   No pedal edema   ASSESSMENT/ PLAN:   Diabetes type 2   The patient's diabetes control appears to be overall  inadequate again with high readings in the afternoons and evenings She is requiring larger doses of insulin especially basal  Since she had previously taking metformin and tolerated at least on 1000 mg; she will start back on same dose of the extended release and discussed how this works and benefits and possible side effects She thinks she is taking Actos regularly and will continue for now Her portion control is fairly good, weight is stable However she probably will benefit from higher doses of mealtime insulin also because of insulin resistance especially at suppertime and this was discussed  Insulin changes: Increase morning Lantus by 4 units and  increase NovoLog  2 units at least round the clock, dosage guidelines given.  Discussed postprandial blood sugar targets   Trigeminal neuralgia: This is likely idiopathic and she is not wanting to start medications again.  Discussed possible treatment options She will discuss  further with neurologist  HYPERTENSION: Well-controlled, to continue same regimen  Hyperlipidemia: She will continue pravastatin  and lipids to be rechecked   Vertigo/dizziness/imbalance is less frequent, may take meclizine as needed and discuss with neurologist also  Counseling time over 50% of today's 25 minute visit  Janequa Kipnis 05/18/2014, 10:52 AM

## 2014-05-21 ENCOUNTER — Encounter (HOSPITAL_BASED_OUTPATIENT_CLINIC_OR_DEPARTMENT_OTHER): Payer: Self-pay | Admitting: Orthopedic Surgery

## 2014-05-26 ENCOUNTER — Telehealth: Payer: Self-pay | Admitting: Endocrinology

## 2014-05-26 NOTE — Telephone Encounter (Signed)
Patient would like to talk to you about her meds, and she would like to know the result of her x ray, she also has diarrhea and need a prescription for that, Plesse advise

## 2014-05-26 NOTE — Telephone Encounter (Signed)
Pt wants to speak with [name] she would not give reasoning but it is important

## 2014-05-27 MED ORDER — INSULIN GLARGINE 100 UNIT/ML ~~LOC~~ SOLN: SUBCUTANEOUS | Status: DC

## 2014-05-27 NOTE — Telephone Encounter (Signed)
Rx for Lantus sent to pharmacy.  Pls advise on diarrhea and x ray results.

## 2014-05-27 NOTE — Telephone Encounter (Signed)
Patient stated she need a new prescription for Lantus.

## 2014-05-28 MED ORDER — DIPHENOXYLATE-ATROPINE 2.5-0.025 MG PO TABS: 1.0000 | ORAL_TABLET | Freq: Two times a day (BID) | ORAL | Status: DC | PRN

## 2014-05-28 NOTE — Telephone Encounter (Signed)
Pt states she would like you to review the chest x-ray from 02/19/2014 that Dr. Annamaria Boots ordered.  Thanks!

## 2014-05-28 NOTE — Telephone Encounter (Signed)
She is supposed to be taking cholestyramine for diarrhea.  If needed she can have Lomotil tablets, #10, one twice a day as needed

## 2014-05-28 NOTE — Telephone Encounter (Signed)
Done. Pt said she had an xray that she wanted [name] to just look at.

## 2014-05-28 NOTE — Telephone Encounter (Signed)
I do not see any x-ray being done

## 2014-06-01 NOTE — Telephone Encounter (Signed)
Noted patient is aware,  She said Dr. Annamaria Boots said he saw Plaque all around her heart.

## 2014-06-01 NOTE — Telephone Encounter (Signed)
Shows emphysema

## 2014-06-04 ENCOUNTER — Telehealth: Payer: Self-pay | Admitting: Internal Medicine

## 2014-06-04 NOTE — Telephone Encounter (Signed)
Per CY:  Sounds like chest wall pain Suggest heating pad  Tylenol or pain reliever OTC PRN pain Doubt pain is heart related.

## 2014-06-04 NOTE — Telephone Encounter (Signed)
We can let her try a couple of samples of Incruse, 1 puff daily, instead of Spiriva

## 2014-06-04 NOTE — Telephone Encounter (Signed)
Called and spoke with pt. Rec's per CY given to patient. Expressed understanding. Pt is asking if there is something else that can be given for her breathing. Pt currently uses Xopenex BID-QID PRN for SOB and Atrovent BID. Pt states that since stopping Spiriva her breathing is worse. Please advise CY. Thanks.

## 2014-06-04 NOTE — Telephone Encounter (Signed)
Called spoke with pt. She reports she has chronic cough but has slightly improved. Lots of wheezing, chest tx. She c/o discomfort in chest when she reached over to grab something earlier today. The pain is easy up now. She does not feel it is her heart but it is her lungs. She is not able to come in for OV. She is using her xopenex inhaler BID-QID, atrovent 2 puffs about twice daily. Please advise Dr. Annamaria Boots thanks  Allergies  Allergen Reactions  . Chlordiazepoxide-Clidinium Other (See Comments)    Sleepy,weak  . Biaxin [Clarithromycin]     Foul taste, abd pain, diarrhea  . Codeine     REACTION: gi upset  . Tramadol      Current Outpatient Prescriptions on File Prior to Visit  Medication Sig Dispense Refill  . acetaminophen (TYLENOL) 500 MG tablet Take 500 mg by mouth every 6 (six) hours as needed for pain.     Marland Kitchen albuterol (PROVENTIL) (2.5 MG/3ML) 0.083% nebulizer solution Take 3 mLs (2.5 mg total) by nebulization every 6 (six) hours as needed for wheezing or shortness of breath. 25 vial prn  . ALPRAZolam (XANAX) 0.5 MG tablet Take 1/2 tablet to 1 tablet two times a day 60 tablet 5  . Alum & Mag Hydroxide-Simeth (MAGIC MOUTHWASH) SOLN Swish with 2 teaspoonfuls by mouth as needed 240 mL 1  . amoxicillin-clavulanate (AUGMENTIN) 875-125 MG per tablet Take 1 tablet by mouth 2 (two) times daily. 14 tablet 0  . aspirin 325 MG tablet Take 325 mg by mouth daily.      Marland Kitchen azithromycin (ZITHROMAX) 250 MG tablet 1 tablet each Monday, Wednesday and friday 15 tablet 4  . B-D INS SYRINGE 0.5CC/31GX5/16 31G X 5/16" 0.5 ML MISC     . bacitracin ophthalmic ointment   0  . BD INSULIN SYRINGE ULTRAFINE 31G X 15/64" 0.5 ML MISC   1  . benzonatate (TESSALON) 200 MG capsule Take 1 capsule (200 mg total) by mouth 3 (three) times daily as needed for cough. 30 capsule 1  . cholestyramine (QUESTRAN) 4 G packet Take one package as needed for Diarrhea. 60 each 1  . dicyclomine (BENTYL) 10 MG capsule Take 1 capsule by  mouth once daily 30 capsule 5  . diphenoxylate-atropine (LOMOTIL) 2.5-0.025 MG per tablet Take 1 tablet by mouth 2 (two) times daily as needed for diarrhea or loose stools. 10 tablet 0  . fenofibrate (TRICOR) 145 MG tablet Take 1 tablet (145 mg total) by mouth daily. 30 tablet 5  . glucose blood (ACCU-CHEK AVIVA PLUS) test strip Use as instructed to check blood sugars 8 times per day dx code 250.02 250 each 3  . HYDROcodone-acetaminophen (NORCO/VICODIN) 5-325 MG per tablet Take 1 tablet by mouth 3 (three) times daily as needed for moderate pain. 90 tablet 0  . insulin aspart (NOVOLOG FLEXPEN) 100 UNIT/ML FlexPen Inject 15 units three times a day 15 mL 3  . insulin glargine (LANTUS) 100 UNIT/ML injection Take 45 units in the morning and 30 units at bedtime 30 mL 3  . ipratropium (ATROVENT HFA) 17 MCG/ACT inhaler Inhale 2 puffs into the lungs 2 (two) times daily as needed (shortness of breath). 1 Inhaler 11  . levalbuterol (XOPENEX HFA) 45 MCG/ACT inhaler Inhale 2 puffs into the lungs every 6 (six) hours as needed for wheezing or shortness of breath. 1 Inhaler 11  . meclizine (ANTIVERT) 25 MG tablet Take 1 tablet (25 mg total) by mouth 3 (three) times daily as needed for  dizziness. 90 tablet 3  . metFORMIN (GLUCOPHAGE-XR) 500 MG 24 hr tablet Take 2 tablets (1,000 mg total) by mouth daily with supper. 60 tablet 3  . metoprolol succinate (TOPROL-XL) 25 MG 24 hr tablet Take 1 tablet (25 mg total) by mouth daily. 30 tablet 3  . omeprazole (PRILOSEC) 20 MG capsule Take 1 capsule (20 mg total) by mouth daily. 30 capsule 5  . pioglitazone (ACTOS) 15 MG tablet Take 1 tablet (15 mg total) by mouth daily. 30 tablet 3  . pravastatin (PRAVACHOL) 10 MG tablet Take 1 tablet (10 mg total) by mouth daily. 30 tablet 3  . promethazine-codeine (PHENERGAN WITH CODEINE) 6.25-10 MG/5ML syrup Take 5 mLs by mouth every 6 (six) hours as needed for cough. 200 mL 0  . valsartan (DIOVAN) 80 MG tablet Take 1 tablet (80 mg total)  by mouth daily. 30 tablet 5  . VIGAMOX 0.5 % ophthalmic solution      No current facility-administered medications on file prior to visit.

## 2014-06-04 NOTE — Telephone Encounter (Signed)
Pt states that she has tried Incruse and was advised to stop taking by CY d/t chest pain and diarrhea Virl Cagey, CMA at 05/04/2014 12:30 PM     Status: Signed       Expand All Collapse All   Pt c/o cough which is increased since last OV along with SOB Pt states that she is still taking ZPAK 3 times weekly as directed. Pt states that Incruse may be causing some chest discomfort she has been having. Pt reports pain starting after she started using Incruse, only present after each use. Pt reports having diarrhea as well. Pt states that she stopped using x 1 week as a trial to see if her symptoms stopped and they did. Pt is concerned that the Incruse is the cause.  Please advise Dr Annamaria Boots. Thanks.      Pt would like other rec's.   Pt aware that we will give her a call back in the morning.  Advised that if symptoms worsen to call 911 or seek care at ED. Pt expressed understanding.  Requests call back before 10:30, if not leave detailed message on machine.  If new medicine to be called in, please go ahead and send to pharmacy.   Please advise Dr Annamaria Boots, Thanks.

## 2014-06-05 MED ORDER — TIOTROPIUM BROMIDE-OLODATEROL 2.5-2.5 MCG/ACT IN AERS: 1.0000 | INHALATION_SPRAY | Freq: Every day | RESPIRATORY_TRACT | Status: DC

## 2014-06-05 NOTE — Telephone Encounter (Signed)
Pt returning call.[name] ° °

## 2014-06-05 NOTE — Telephone Encounter (Signed)
lmomtcb x1 

## 2014-06-05 NOTE — Telephone Encounter (Signed)
Pt aware of rec's per CY Stiolto inhaler placed up front to be picked up Pt states that she will pick up on Monday. Pt aware that someone will show her how to use it when she picks it up.  Nothing further needed.

## 2014-06-05 NOTE — Telephone Encounter (Signed)
Ok, Im sorry. Agree with not using Incruse.  We could give her sample of Stiolto to try  1-2 puffs, once daily

## 2014-06-08 ENCOUNTER — Telehealth: Payer: Self-pay | Admitting: Internal Medicine

## 2014-06-08 NOTE — Telephone Encounter (Signed)
Pt has been shown how to use Stiolto. Advised on dosage as well. Nothing further was needed.

## 2014-06-16 ENCOUNTER — Other Ambulatory Visit: Payer: Self-pay | Admitting: *Deleted

## 2014-06-16 MED ORDER — METOPROLOL SUCCINATE ER 25 MG PO TB24: 25.0000 mg | ORAL_TABLET | Freq: Every day | ORAL | Status: DC

## 2014-06-17 ENCOUNTER — Telehealth: Payer: Self-pay | Admitting: *Deleted

## 2014-06-17 ENCOUNTER — Other Ambulatory Visit: Payer: Self-pay | Admitting: *Deleted

## 2014-06-17 ENCOUNTER — Telehealth: Payer: Self-pay | Admitting: Internal Medicine

## 2014-06-17 MED ORDER — CIPROFLOXACIN HCL 500 MG PO TABS: 500.0000 mg | ORAL_TABLET | Freq: Two times a day (BID) | ORAL | Status: DC

## 2014-06-17 MED ORDER — TIOTROPIUM BROMIDE-OLODATEROL 2.5-2.5 MCG/ACT IN AERS: 1.0000 | INHALATION_SPRAY | Freq: Every day | RESPIRATORY_TRACT | Status: DC

## 2014-06-17 NOTE — Telephone Encounter (Signed)
Need to confirm what symptoms she is having with bladder infection.  If having burning pain and pressure and start Cipro 500 mg twice a day for 5 days. Do not know anything about her new medication.  She will need to increase her insulin doses to bring her blood sugars down as previously instructed

## 2014-06-17 NOTE — Telephone Encounter (Signed)
Patient has been having those symptoms, rx sent for cipro

## 2014-06-17 NOTE — Telephone Encounter (Signed)
Patient called today,  She said she has a bladder infection again and wants an rx for cipro.  She also states that Dr. Annamaria Boots put her on a new medication to help her breath, stioleoresmimat, she said it helps a lot and wants to know if that could run her sugars up as they are still in the 200 range, she said she doesn't eat "hardly anything".  Please advise

## 2014-06-17 NOTE — Telephone Encounter (Signed)
Spoke with pt, states that she was given a sample of Stiolto at last office visit, was told to call for a rx if this helps with her breathing.  She states this has helped to open her chest.  I do not see Stiolto given at last ov but phone note on 1/28 was given a sample.   Uses [facility] on [address].  This has been sent in to the pharmacy.  Nothing further needed.

## 2014-06-18 ENCOUNTER — Telehealth: Payer: Self-pay | Admitting: Internal Medicine

## 2014-06-18 ENCOUNTER — Ambulatory Visit (INDEPENDENT_AMBULATORY_CARE_PROVIDER_SITE_OTHER): Payer: Medicare Other | Admitting: Neurology

## 2014-06-18 ENCOUNTER — Encounter: Payer: Self-pay | Admitting: Neurology

## 2014-06-18 VITALS — BP 140/60 | HR 78 | Temp 98.2°F | Resp 16 | Ht 63.0 in | Wt 200.1 lb

## 2014-06-18 DIAGNOSIS — R42 Dizziness and giddiness: Secondary | ICD-10-CM | POA: Diagnosis not present

## 2014-06-18 DIAGNOSIS — R251 Tremor, unspecified: Secondary | ICD-10-CM | POA: Diagnosis not present

## 2014-06-18 DIAGNOSIS — G4485 Primary stabbing headache: Secondary | ICD-10-CM

## 2014-06-18 NOTE — Patient Instructions (Signed)
I don't have a specific cause for your headaches.  Two ways we can look into it include: 1.  Getting MRI of the brain 2.  Just treating the pain with medication.  I don't see any urgency for MRI as your exam looks okay.  If you change your mind in regards to tests and treatment, please call

## 2014-06-18 NOTE — Telephone Encounter (Signed)
Per 06/17/14 phone note: Len Blalock, CMA at 06/17/2014 12:04 PM     Status: Signed       Expand All Collapse All   Spoke with pt, states that she was given a sample of Stiolto at last office visit, was told to call for a rx if this helps with her breathing. She states this has helped to open her chest. I do not see Stiolto given at last ov but phone note on 1/28 was given a sample. Uses [facility] on [address]. This has been sent in to the pharmacy. Nothing further needed      --  Dr. Annamaria Boots please advise if pt is able to have sample of stiolto? thanks

## 2014-06-18 NOTE — Progress Notes (Signed)
NEUROLOGY CONSULTATION NOTE  [name] MRN: [id number] DOB: 1935-08-01  Referring provider: Dr. Dwyane Dee Primary care provider: Dr. Dwyane Dee  Reason for consult:  Headache.  HISTORY OF PRESENT ILLNESS: [name] is a 79 year old right-handed woman with type II diabetes mellitus, chronic bronchitis (on O2), arthritis and hypertension who presents for headache.  Records, CT of head and labs reviewed.  Several months ago, she began experiencing brief stinging pain from the back of the head to the right eye.  It lasts for only a second.  There is no associated nausea, photophobia, phonophobia or visual disturbance.  She feels that her right eye waters.  Nothing triggers it and it occurs spontaneously.  There is no burning or tingling.  She saw an ophthalmologist who suggested that it could be trigeminal neuralgia.    She also reports episodes of dizzy spells.  For 15 years, she has had episodes of vertigo.  In 2014, she had an episode while driving her car when she suddenly felt that her body was being shaken.  There was no associated dizziness or weakness.  She drove home and called EMS.  Her exam in the ED was unremarkable.  She was admitted to the hospital in January 2015 for dizzy spell associated with spinning sensation.  CT of the head was unremarkable.  She was diagnosed with BPPV and UTI.  She had vestibular rehab, which helped.  She also reports tremors and feeling shaky.  She has uncontrolled diabetes with Hgb A1c values around 9 for some time.  PAST MEDICAL HISTORY: Past Medical History  Diagnosis Date  . Irritable bowel syndrome   . Other chronic nonalcoholic liver disease   . Esophageal reflux   . Allergic rhinitis, cause unspecified     Sinus CT Rec 12-23-2009  . 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 Sciatica   . Shingles 2010  . Steatohepatitis   . Diabetes mellitus   . Diverticulosis   . Hiatal hernia     . Esophageal stricture   . Diarrhea   . GERD (gastroesophageal reflux disease)   . Shortness of breath   . Hypertension   . Arthritis   . Glaucoma   . Complication of anesthesia     takes along time to wake up  . Oxygen deficiency   . Benign paroxysmal positional vertigo 08/09/2012  . Neuropathy in diabetes 08/09/2012    PAST SURGICAL HISTORY: Past Surgical History  Procedure Laterality Date  . Cholecystectomy open  1978  . Liver biopsy  08-1992  . Parathyroid exploration    . Tonsillectomy and adenoidectomy    . Total abdominal hysterectomy    . Esophagogastroduodenoscopy  4332,95-18    H Hernia,es.stricture s/p dil 20F  . Colonoscopy  07-2001    mild diverticulosis  . Carpal tunnel release      right hand  . Cataract extraction      Bilateral  . Ulnar nerve transposition  12/07/2011    Procedure: ULNAR NERVE DECOMPRESSION/TRANSPOSITION;this was cancelled-not done  Surgeon: Cammie Sickle., MD;  Location: [place];  Service: Orthopedics;  Laterality: Right;  right ulnar nerve in situ decompression  . Ulnar tunnel release  03/07/2012    Procedure: CUBITAL TUNNEL RELEASE;  Surgeon: Roseanne Kaufman, MD;  Location: [place];  Service: Orthopedics;  Laterality: Right;  ulnar nerve release at the elbow      MEDICATIONS: Current Outpatient Prescriptions  on File Prior to Visit  Medication Sig Dispense Refill  . acetaminophen (TYLENOL) 500 MG tablet Take 500 mg by mouth every 6 (six) hours as needed for pain.     Marland Kitchen albuterol (PROVENTIL) (2.5 MG/3ML) 0.083% nebulizer solution Take 3 mLs (2.5 mg total) by nebulization every 6 (six) hours as needed for wheezing or shortness of breath. 25 vial prn  . ALPRAZolam (XANAX) 0.5 MG tablet Take 1/2 tablet to 1 tablet two times a day 60 tablet 5  . Alum & Mag Hydroxide-Simeth (MAGIC MOUTHWASH) SOLN Swish with 2 teaspoonfuls by mouth as needed 240 mL 1  . amoxicillin-clavulanate (AUGMENTIN) 875-125 MG per tablet  Take 1 tablet by mouth 2 (two) times daily. 14 tablet 0  . aspirin 325 MG tablet Take 325 mg by mouth daily.      Marland Kitchen azithromycin (ZITHROMAX) 250 MG tablet 1 tablet each Monday, Wednesday and friday 15 tablet 4  . B-D INS SYRINGE 0.5CC/31GX5/16 31G X 5/16" 0.5 ML MISC     . bacitracin ophthalmic ointment   0  . BD INSULIN SYRINGE ULTRAFINE 31G X 15/64" 0.5 ML MISC   1  . benzonatate (TESSALON) 200 MG capsule Take 1 capsule (200 mg total) by mouth 3 (three) times daily as needed for cough. (Patient not taking: Reported on 06/18/2014) 30 capsule 1  . cholestyramine (QUESTRAN) 4 G packet Take one package as needed for Diarrhea. 60 each 1  . ciprofloxacin (CIPRO) 500 MG tablet Take 1 tablet (500 mg total) by mouth 2 (two) times daily. 10 tablet 0  . dicyclomine (BENTYL) 10 MG capsule Take 1 capsule by mouth once daily 30 capsule 5  . diphenoxylate-atropine (LOMOTIL) 2.5-0.025 MG per tablet Take 1 tablet by mouth 2 (two) times daily as needed for diarrhea or loose stools. 10 tablet 0  . fenofibrate (TRICOR) 145 MG tablet Take 1 tablet (145 mg total) by mouth daily. (Patient not taking: Reported on 06/18/2014) 30 tablet 5  . glucose blood (ACCU-CHEK AVIVA PLUS) test strip Use as instructed to check blood sugars 8 times per day dx code 250.02 250 each 3  . HYDROcodone-acetaminophen (NORCO/VICODIN) 5-325 MG per tablet Take 1 tablet by mouth 3 (three) times daily as needed for moderate pain. (Patient not taking: Reported on 06/18/2014) 90 tablet 0  . INCRUSE ELLIPTA 62.5 MCG/INH AEPB   0  . insulin aspart (NOVOLOG FLEXPEN) 100 UNIT/ML FlexPen Inject 15 units three times a day 15 mL 3  . insulin glargine (LANTUS) 100 UNIT/ML injection Take 45 units in the morning and 30 units at bedtime 30 mL 3  . ipratropium (ATROVENT HFA) 17 MCG/ACT inhaler Inhale 2 puffs into the lungs 2 (two) times daily as needed (shortness of breath). 1 Inhaler 11  . levalbuterol (XOPENEX HFA) 45 MCG/ACT inhaler Inhale 2 puffs into the  lungs every 6 (six) hours as needed for wheezing or shortness of breath. 1 Inhaler 11  . meclizine (ANTIVERT) 25 MG tablet Take 1 tablet (25 mg total) by mouth 3 (three) times daily as needed for dizziness. 90 tablet 3  . metFORMIN (GLUCOPHAGE-XR) 500 MG 24 hr tablet Take 2 tablets (1,000 mg total) by mouth daily with supper. 60 tablet 3  . metoprolol succinate (TOPROL-XL) 25 MG 24 hr tablet Take 1 tablet (25 mg total) by mouth daily. 30 tablet 3  . omeprazole (PRILOSEC) 20 MG capsule Take 1 capsule (20 mg total) by mouth daily. 30 capsule 5  . pioglitazone (ACTOS) 15 MG tablet Take 1  tablet (15 mg total) by mouth daily. 30 tablet 3  . pravastatin (PRAVACHOL) 10 MG tablet Take 1 tablet (10 mg total) by mouth daily. 30 tablet 3  . promethazine-codeine (PHENERGAN WITH CODEINE) 6.25-10 MG/5ML syrup Take 5 mLs by mouth every 6 (six) hours as needed for cough. 200 mL 0  . Tiotropium Bromide-Olodaterol (STIOLTO RESPIMAT) 2.5-2.5 MCG/ACT AERS Inhale 1-2 puffs into the lungs daily. 1 Inhaler 6  . trimethoprim (TRIMPEX) 100 MG tablet   1  . valsartan (DIOVAN) 80 MG tablet Take 1 tablet (80 mg total) by mouth daily. 30 tablet 5  . VIGAMOX 0.5 % ophthalmic solution      No current facility-administered medications on file prior to visit.    ALLERGIES: Allergies  Allergen Reactions  . Chlordiazepoxide-Clidinium Other (See Comments)    Sleepy,weak  . Biaxin [Clarithromycin]     Foul taste, abd pain, diarrhea  . Codeine     REACTION: gi upset  . Tramadol     FAMILY HISTORY: Family History  Problem Relation Age of Onset  . Lung cancer Mother     small cell;Byssinosis  . Heart disease Father   . Lung cancer Sister   . Liver cancer Sister     ? mets from another area of the body  . Diabetes      grandmother  . Stroke Maternal Grandfather     SOCIAL HISTORY: History   Social History  . Marital Status: Single    Spouse Name: N/A  . Number of Children: N/A  . Years of Education: N/A    Occupational History  . Retired     Social History Main Topics  . Smoking status: Former Smoker    Types: Cigarettes    Quit date: 05/08/1988  . Smokeless tobacco: Never Used  . Alcohol Use: No  . Drug Use: No  . Sexual Activity: No   Other Topics Concern  . Not on file   Social History Narrative    REVIEW OF SYSTEMS: Constitutional: fatigue Eyes: as above Ear, nose and throat: No hearing loss, ear pain, nasal congestion, sore throat Cardiovascular: No chest pain, palpitations Respiratory:  Short of breath, wheezes GastrointestinaI: No nausea, vomiting, diarrhea, abdominal pain, fecal incontinence Genitourinary:  No dysuria, urinary retention or frequency Musculoskeletal:  Neck pain Integumentary: No rash, pruritus, skin lesions Neurological: as above Psychiatric: No depression, insomnia, anxiety Endocrine: No palpitations, fatigue, diaphoresis, mood swings, change in appetite, change in weight, increased thirst Hematologic/Lymphatic:  No anemia, purpura, petechiae. Allergic/Immunologic: no itchy/runny eyes, nasal congestion, recent allergic reactions, rashes  PHYSICAL EXAM: Filed Vitals:   06/18/14 1016  BP: 140/60  Pulse: 78  Temp: 98.2 F (36.8 C)  Resp: 16   General: No acute distress Head:  Normocephalic/atraumatic Eyes:  fundi unremarkable, without vessel changes, exudates, hemorrhages or papilledema. Neck: supple, no paraspinal tenderness, full range of motion Back: No paraspinal tenderness Heart: regular rate and rhythm Lungs: Clear to auscultation bilaterally. Vascular: No carotid bruits. Neurological Exam: Mental status: alert and oriented to person, place, and time, recent and remote memory intact, fund of knowledge intact, attention and concentration intact, speech fluent and not dysarthric, language intact. Cranial nerves: CN I: not tested CN II: pupils equal, round and reactive to light, visual fields intact, fundi unremarkable, without vessel  changes, exudates, hemorrhages or papilledema. CN III, IV, VI:  full range of motion, no nystagmus, no ptosis CN V: facial sensation intact CN VII: upper and lower face symmetric CN VIII: hearing intact CN  IX, X: gag intact, uvula midline CN XI: sternocleidomastoid and trapezius muscles intact CN XII: tongue midline Bulk & Tone: normal, no fasciculations. Motor:  5/5 throughout Sensation:  Reduced pinprick sensation in hands and feet up to above the elbows and knees.  Decreased vibration up to the knees. Deep Tendon Reflexes:  Trace in upper extremities, absent in lower extremities.  Toes equivocal. Finger to nose testing:  Very mild postural and kinetic tremor Gait:  Wide based gait, cautious.  Cannot tandem. Romberg with mild sway.  IMPRESSION: Unilateral headache, probably primary stabbing headache Dizziness Tremor  PLAN: 1.  Considered MRI of brain, which she does not wish to pursue at this time. 2.  If the headache occurs infrequently and since they are so brief, I would hold off on medication.  However, we can consider gabapentin, Lyrica or melatonin.  She will think about it. 3.  I would monitor tremor for now.  Thank you for allowing me to take part in the care of this patient.  Metta Clines, DO  CC:  [name], MD

## 2014-06-18 NOTE — Telephone Encounter (Signed)
Fax for PA does not have a PA # to call. Has pt ID # [id number] Called [facility] but was placed on hold for several minutes and never received anyone   Called pt and is aware we will call her insurance in AM for PA

## 2014-06-18 NOTE — Telephone Encounter (Signed)
Pt has not heard anything yet. Needs this done today! cb at [phone]

## 2014-06-18 NOTE — Telephone Encounter (Signed)
Patient called backed requesting CY put in PA. Pt states she does not have a ride to come get sample and knows that her insurance company will approve the PA if CY puts it in. Please call her back at previous number given.

## 2014-06-18 NOTE — Telephone Encounter (Signed)
Pt is needeing the  Forms filled out so she can get the meds she doesn't wasn't to change [phone]

## 2014-06-18 NOTE — Telephone Encounter (Signed)
Spoke with CDY. We are not going to do PA. We can give pt 1 sample   Called pt. She reports she is not able to come get a sample until next week and will run out of the medication by the weekend. She wants to know what she can take until she can make it to the office for stiolto. She reports she has found this is the only medication she can tolerate and helps her breathing. Please advise Dr. Annamaria Boots thanks

## 2014-06-18 NOTE — Telephone Encounter (Signed)
What forms? I see the note about Stiolto, but it looks as if that was completed. What else is needed?

## 2014-06-18 NOTE — Telephone Encounter (Signed)
Ok to try for a prior auth for [employer] inhaler # 1, 2 puffs, once daily then rinse mouth, refill prn  She finds this helps her COPD and she has not tolerated several other bronchodilators, either because of overstimulation or ineffective.

## 2014-06-19 NOTE — Telephone Encounter (Signed)
Spoke with pharmacist at [facility]. He is going to fax [place] a PA sheet with the number on it. I asked for the # and was told that he would just fax it over. Will await fax.

## 2014-06-22 MED ORDER — TIOTROPIUM BROMIDE-OLODATEROL 2.5-2.5 MCG/ACT IN AERS: 1.0000 | INHALATION_SPRAY | Freq: Every day | RESPIRATORY_TRACT | Status: DC

## 2014-06-22 MED ORDER — UMECLIDINIUM-VILANTEROL 62.5-25 MCG/INH IN AEPB: 1.0000 | INHALATION_SPRAY | Freq: Every day | RESPIRATORY_TRACT | Status: DC

## 2014-06-22 NOTE — Telephone Encounter (Signed)
Ok to send script for Combivent inhaler, # 1, 1 puff, every 4-6 hours as needed, ref prn Please be sure she understands her insurance wants to try this before considering Stiolto

## 2014-06-22 NOTE — Telephone Encounter (Signed)
CY we have sample of Anoro but not Combivent-okay to send Rx for this? Thanks.

## 2014-06-22 NOTE — Telephone Encounter (Signed)
Spoke with pt - discussed below per Dr. Annamaria Boots.  She verbalized understanding. Pt would like Anoro sample placed at front -- this has been done. She does not want combivent inhaler sent to pharm at this time.  States she will call office back once she is ready for this to be sent.

## 2014-06-22 NOTE — Telephone Encounter (Signed)
Pt aware that we are still waiting on PA forms to be faxed. This was requested to be faxed 06/19/14.  Pt aware that a sample will be placed up front until we can get this approved.  Pt states that she has been doing well on this inhaler and it seems to help her very much in controlling her symptoms.  Pt wishes to stay on this and have PA completed.  ------------- PA form received 06/19/14- this has not been completed.  Called [facility] [phone] to initiate PA for Stiolto- transferred to [place] [phone]) Patient ID [id number] Spoke with Anu(Clinical Pharmacist), states that this is a non-formulary and pt has to try and fail Combivent Respimat and Anoro before coverage can be granted.  This has been denied.   Please advise on alternative as listed above -  pt has to try and fail Combivent Respimat and Anoro before coverage can be granted.. Thanks.

## 2014-06-22 NOTE — Telephone Encounter (Signed)
[facility] calling to check on status of PA [phone].[name]

## 2014-06-22 NOTE — Telephone Encounter (Signed)
We need to be able to tell her insurance that she failed to improve with Anoro and Combivent before they will consider prior authorization for Stiolto. Ok to give sample Anoro, 1 puff, once daily  If we have one- ok to give sample Combivent Respimat   1 puff every 6 hours as needed

## 2014-06-23 ENCOUNTER — Telehealth: Payer: Self-pay | Admitting: Internal Medicine

## 2014-06-23 ENCOUNTER — Telehealth: Payer: Self-pay | Admitting: Endocrinology

## 2014-06-23 NOTE — Telephone Encounter (Signed)
Spoke with pt and advised that sample of Anoro was left at front desk yesterday 06/22/14  And is ready for pick up.

## 2014-06-23 NOTE — Telephone Encounter (Signed)
Samples of correct medication (Anoro) have been given to pt's aid. Apologized for any inconvenience.

## 2014-06-23 NOTE — Telephone Encounter (Signed)
Wanting to come by office to pick up sample of anero, states pharm doesn't have any to fill rx if sent and she needs it asap. Will be by to pick up round 11am per patient

## 2014-06-23 NOTE — Telephone Encounter (Signed)
Pt wanted to speak with Suanne Marker but when I told her Dr. Dwyane Dee and Suanne Marker were out she did not want to leave a message for the covering nurses and providers. She is going to wait until next week when Dr. Dwyane Dee returns.

## 2014-06-24 ENCOUNTER — Telehealth: Payer: Self-pay | Admitting: *Deleted

## 2014-06-24 ENCOUNTER — Telehealth: Payer: Self-pay | Admitting: Internal Medicine

## 2014-06-24 NOTE — Telephone Encounter (Signed)
Patient does not want to take Breo.  She says that she has seen advertisements on TV about the [facility] and that it is used for Asthma patients not COPD patients.  She said that she does not have asthma.  She says that she wants to continue with the Anoro and see how it works, if it doesn't work she would like to try to appeal for the [employer].  She says that the [employer] worked very well.

## 2014-06-24 NOTE — Telephone Encounter (Signed)
Spoke with pt, clarified that she needs to use her Anoro 1 puff 1 time daily and rinse mouth after use.  Nothing further needed.

## 2014-06-24 NOTE — Telephone Encounter (Signed)
Received letter from [place].  Called [employer], the only other choice patient has not tried that they will cover is Breo 100/25.  They ran it through her insurance and they said that it would cost patient $3.60.  Would you like to switch patient to [facility]?

## 2014-06-25 NOTE — Telephone Encounter (Signed)
Please read message below and be advised.

## 2014-06-25 NOTE — Telephone Encounter (Signed)
Spoke with pt .  She refuses to try [facility] and will continue to take Anoro until sample is finished and will call Katie with update.  Will forward to Katie to f/u on.

## 2014-06-25 NOTE — Telephone Encounter (Signed)
I'm sorry, but the insurance company she is signed up with wants her to try [facility] before it will pay for something that is not covered in their contracts      Breo 100      1 puff then rinse mouth, once daily, ref prn. She can try a sample first and give it an honest try, then let us know.

## 2014-06-29 ENCOUNTER — Telehealth: Payer: Self-pay | Admitting: Internal Medicine

## 2014-06-29 DIAGNOSIS — J9611 Chronic respiratory failure with hypoxia: Secondary | ICD-10-CM

## 2014-06-30 ENCOUNTER — Telehealth: Payer: Self-pay

## 2014-06-30 ENCOUNTER — Other Ambulatory Visit: Payer: Self-pay | Admitting: *Deleted

## 2014-06-30 ENCOUNTER — Telehealth: Payer: Self-pay | Admitting: Endocrinology

## 2014-06-30 MED ORDER — OMEPRAZOLE 20 MG PO CPDR: 20.0000 mg | DELAYED_RELEASE_CAPSULE | Freq: Every day | ORAL | Status: DC

## 2014-06-30 MED ORDER — TIOTROPIUM BROMIDE-OLODATEROL 2.5-2.5 MCG/ACT IN AERS
2.0000 | INHALATION_SPRAY | Freq: Every day | RESPIRATORY_TRACT | Status: DC
Start: 2014-06-30 — End: 2015-07-01

## 2014-06-30 NOTE — Telephone Encounter (Signed)
Spoke with patient-she states she tried Incruse-caused chest pressure and Anoro-caused sore throat(even after rinsing mouth afterwards), leg cramps, raspy voice, and chest pressure. Will not use Breo due to not having asthma and has seen on TV not to take if she does not have asthma.  Pt states she feels better on Stiolto and would like for [place] to proceed with PA through insurance company.    Also, patient would like to know if CY would send order to DME company(possibly [facility] if insurance will approve) for her to have a pulse ox at home to keep check on her O2 levels as she feels more SOB at times even with O2 on. Pt used to have home health nurse that came out and would check for her but no longer has that service.    CY please advise on both questions/concerns. Thanks.

## 2014-06-30 NOTE — Telephone Encounter (Signed)
Rx was sent to pharm and will await PA form  LMTCB for the pt

## 2014-06-30 NOTE — Telephone Encounter (Signed)
Ok to request prior auth for [employer] with Rx, # 1,  2 puffs, once daily, refill x 11

## 2014-06-30 NOTE — Telephone Encounter (Signed)
Pt called wanting MD's advise about possible [place]. Pt stated on 06/20/2014 she began having pain and a palpable lump on the right side of her neck. Pt states it has started hurting in her right ear and has become swollen.Pt wanted to know if she should have a [place] or a ov to discuss.  Please advise, Thanks!

## 2014-06-30 NOTE — Telephone Encounter (Signed)
Pt only wants to speak to [facility] about her medications. I tried to help her but she refused to speak to me. Will route to Katie to follow up on.

## 2014-06-30 NOTE — Telephone Encounter (Signed)
[phone] returning call

## 2014-06-30 NOTE — Telephone Encounter (Signed)
She needs to be seen for evaluation either here or from her ENT physician

## 2014-06-30 NOTE — Telephone Encounter (Signed)
NAME ALERT. Message entered in error.

## 2014-06-30 NOTE — Telephone Encounter (Signed)
Please call pt no reason given

## 2014-07-01 NOTE — Telephone Encounter (Signed)
Pt coming in for appointment on 07/03/2014 at 10 am.

## 2014-07-02 NOTE — Telephone Encounter (Signed)
Called and spoke to pt. Informed pt of the order for pulse/ox. Order placed. Pt verbalized understanding and denied any further questions or concerns at this time.   Will await PA form.

## 2014-07-02 NOTE — Telephone Encounter (Signed)
Ok to order pulse oximeter through her DME for dx emphysema and chronic respiratory failure with hypoxia

## 2014-07-02 NOTE — Telephone Encounter (Signed)
Called and spoke to pt. Informed her of the PA process. Pt verbalized understanding. Pt also requesting a pulse/ox through DME because she cannot afford it at the store.  CY please advise.

## 2014-07-03 ENCOUNTER — Ambulatory Visit (INDEPENDENT_AMBULATORY_CARE_PROVIDER_SITE_OTHER): Payer: Medicare Other | Admitting: Endocrinology

## 2014-07-03 VITALS — BP 120/80 | HR 94 | Temp 98.0°F | Wt 202.0 lb

## 2014-07-03 DIAGNOSIS — E1165 Type 2 diabetes mellitus with hyperglycemia: Secondary | ICD-10-CM

## 2014-07-03 DIAGNOSIS — IMO0002 Reserved for concepts with insufficient information to code with codable children: Secondary | ICD-10-CM

## 2014-07-03 DIAGNOSIS — M542 Cervicalgia: Secondary | ICD-10-CM | POA: Diagnosis not present

## 2014-07-03 MED ORDER — KETOCONAZOLE 2 % EX CREA: 1.0000 "application " | TOPICAL_CREAM | Freq: Every day | CUTANEOUS | Status: DC

## 2014-07-03 MED ORDER — METHOCARBAMOL 500 MG PO TABS: 500.0000 mg | ORAL_TABLET | Freq: Three times a day (TID) | ORAL | Status: DC | PRN

## 2014-07-03 MED ORDER — FLUCONAZOLE 150 MG PO TABS
ORAL_TABLET | ORAL | Status: DC
Start: 2014-07-03 — End: 2014-11-17

## 2014-07-03 NOTE — Patient Instructions (Addendum)
Lantus 60 in am and Humalog at least 12 Humalog at supper  Take Methacarbamol upto 3x daily

## 2014-07-03 NOTE — Progress Notes (Signed)
Subjective:      Patient ID: [name], female   DOB: Aug 15, 1935, 79 y.o.   MRN: [id number]  HPI  Chief complaint: Neck pain  She said that for the last week or so she has had pain on the right side of the neck which is mostly on the side and back. She thinks the pain does not radiate and is not associated with a headache or stiffness. She does have some difficulty with the pain with neck movement.  No recent trauma She is concerned about problems with her carotid arteries and also possibly any connection with previous treatments given by orthopedic surgeon   Review of Systems  DIABETES: She has brought her blood sugar monitor for review. Currently doing 56 Lantus in the morning but her blood sugars are averaging 201 before supper and 237 at bedtime and only somewhat better fasting.  She is tolerating metformin  She is asking about various other problem she is having with her COPD, Fatty deposit on the left neck  She is also asking for treatment for her recurrent intertrigo under her breasts and in the groin also which is not completely relieved byOTC antifungal cream.      Objective:   Physical Exam  She has mild neck spasm on both sides of the cervical area. She has some restriction of lateral movement on the right side. No lymphadenopathy or other mass    Assessment:     Cervical spondylosis with pain. Uncontrolled diabetes  Recurrent intertrigo related to frequent use of antibiotics    Plan:     She can try Robaxin for the cervical pain Since she is intolerant to other analgesics she can try Tylenol  Increase morning Lantus to 60 units  She will try Nizoral cream and Diflucan for candidiasis

## 2014-07-03 NOTE — Telephone Encounter (Signed)
Formulary Exception request started today. Member ID [id number] Patient has tried and failed Breo, Incruse, Anoro and Spiriva.  Per pharmacist, Remi Deter., this claim has already been placed and denied. The determination time for this medication will be 72hours.  I explained that this request needed to be expedited as the patient does not need to be without medication. After review of medications tried and failed and patient's diagnosis pharmacist has decided to approve this medication.   This has been approved through 07/03/2015. Pt is aware. Will call if any issues filling this medication at [facility].  Nothing further needed.

## 2014-07-03 NOTE — Progress Notes (Signed)
Pre visit review using our clinic review tool, if applicable. No additional management support is needed unless otherwise documented below in the visit note. 

## 2014-07-08 ENCOUNTER — Ambulatory Visit: Payer: Medicare Other | Admitting: Internal Medicine

## 2014-07-08 ENCOUNTER — Encounter: Payer: Self-pay | Admitting: Internal Medicine

## 2014-07-08 VITALS — BP 132/60 | HR 95 | Ht 63.0 in | Wt 202.0 lb

## 2014-07-08 DIAGNOSIS — J9611 Chronic respiratory failure with hypoxia: Secondary | ICD-10-CM

## 2014-07-08 NOTE — Patient Instructions (Signed)
We can continue O2 3- 3.5 L/M/ advanced  Ok to continue Stiolto  Please call as needed

## 2014-07-08 NOTE — Progress Notes (Signed)
Patient ID: [name], female    DOB: 16-Mar-1936, 79 y.o.   MRN: [id number]  HPI SATURATION QUALIFICATIONS: Patient Saturations on Room Air while Ambulating = 84% Patient Saturations on 2.5 Liters of oxygen while Ambulating = 92%[name],CMA  09/28/10-  75 yoF former smoker with COPD, complicated by chronic rhinitis, GERD. Last here 04/29/10- note reviewed. Acute visit.-- Woke with soreness across under her breasts. Losing voice. Coughing spells, with much liquid coming up and from nose. Prior to that hadn't been coughing. C/o weak, dizzy, light headed- relieved by her oxygen. . Moving into a smaller apartment. Denies fever. Easy DOE short walks room to room.   10/31/10- 69 yoF former smoker with COPD, complicated by chronic rhinitis, GERD. She denies major change since last here. Has to stay in to avoid heat and on her oxygen much of the time in the house. Coughs scant clear or lemon yellow, with some hoarseness which comes and goes. She describes hoarseness as worse with phone conversations, variable but rarely if ever clear. She had seen Dr [name] years ago for epistaxis. Easy choke with eating. Discussed aspiration precautions, chin tuck.   02/26/11- 55 yoF former smoker with COPD, complicated by chronic rhinitis, GERD. Has had flu vaccine. No coughing through the summer. We first cool days as she stepped outside she would begin to cough again. Dry hacking cough. Very aware of active reflux anytime she bends over, regurgitating recently swallowed food. Has also had some diarrhea. Complains of spasms intermittently left lower anterior chest and left upper quadrant. It may all be GI. She thinks it is part of her diabetic neuropathy. Has appointment soon with Dr.Brodie.  08/16/11-75 yoF former smoker with COPD, complicated by chronic rhinitis, GERD. During the winter had prolonged diarrhea evaluated by Dr [name] and apparently attributed to her diabetic neuropathy. May have had  esophagitis-was told to take Magic mouthwash. Gradually easier dyspnea on exertion. Now on oxygen 2-3 L most of the time. She came today with acute complaint of aching pain intermittently for the past several weeks, dull. Associated with mid back at strap level. Pain is gone right now. She saw no effect of activity or position and said pain was nonpleuritic or radiating.  CXR- 08/16/11- reviewed w/ her. NAD with no acute process.   08/28/11-75 yoF former smoker with COPD, complicated by chronic rhinitis, GERD,  DM. Back pain comes and goes. She implies that it is better today and says that she really has been present a long time as part of her degenerative back pain. Complains of feeling sluggish and tired. Recent bronchitis with productive green sputum watery nose no fever. Using Atrovent. Intolerant of albuterol and Symbicort because of overstimulation. Home oxygen is 2-1/2-3 L. CXR 08/15/11-  IMPRESSION:  Hyperinflation.  No active cardiopulmonary disease.  Original Report Authenticated By: [name], M.D.   12/28/11- 47 yoF former smoker with COPD, complicated by chronic rhinitis, GERD,  DM. Not doing well-stays on O2 all the time; since being on Spiriva at first did great but has noticed having to stop more and rest during the day(nap) She  stays indoors most of the time without many friends to do things with. She often has self-limited sharp twinges right anterior chest wall and left infrascapular back. With her known degenerative disc disease, we discussed the possibility this was nerve root irritation. Spiriva helps variable cough.  04/23/12- 32 yoF former smoker with COPD, complicated by chronic rhinitis, GERD,  DM. FOLLOWS FOR: still  has good and bad days with SOB and activity levels Watery rhinorrhea. No acute problems. No respiratory problems associated with surgery for nerve impingement in her right hand. She continues oxygen 3 or 3 a half liters/Advanced PFT: 01/16/2012 severe  obstructive airways disease with insignificant response to bronchodilator, air trapping, diffusion moderately reduced. FEV1 0.82/45%, FEV1/FEC 0.46. Emphysema pattern on the loop. TLC 100%, RV 148%, DLCO 47%.  10/22/12- 34 yoF former smoker with COPD, complicated by chronic rhinitis, GERD,  DM FOLLOWS FOR:has to pace herself more; continues to have nervous spell(could be from DM) SOB pretty much all the time Episodic shaky spells for which she says she's had evaluation by neurology, ENT, primary physician and ER. "Electric shock sensation" from her known neuropathy. Stress makes all of this worse. Breathing has not really changed. We talked about the possibility she was having a vasomotor/autonomic neuropathy related to her diabetes  01/21/13- 77 yoF former smoker with COPD, complicated by chronic rhinitis, GERD,  DM FOLLOWS FOR:has to pace herself more; continues to have nervous spell(could be from DM) SOB pretty much all the time Has been treated for a corneal abscess with Cipro, another antibiotic for cough, and for chronic cystitis. Got blistering intertrigo which Dr. Dwyane Dee treated with Flagyl by her report. Little change in her chronic cough with clear mucus. Twinges of bilateral tussive rib pains.  07/21/13-78 yoF former smoker with COPD, complicated by chronic rhinitis, GERD,  DM FOLLOWS FOR: Stays tired all the time; was in [facility] January 2015. Has RN that comes to her home. Pt states she has chest tightness as well. Stays tired. Home visiting nurse has completed. Chest gets tight-nebulizer helps. Watery nose.  09/02/2013 Acute OV  Complains of 2 weeks of cough and congestion . cough still going on-lt. green,occass. wheezing,chest tightness no fcs Was called in augmentin on 4/17, finished last dose yesterday. Mucus is some better but still coughing and wheezing . Cough is keeping her up at night.  Patient denies any this is chest pain, orthopnea, PND, or leg swelling CXR last ov showed  chronic changes with COPD 07/21/13   11/24/13- 78 yoF former smoker with COPD, complicated by chronic rhinitis, GERD,  DM FOLLOWS FOR:  Increased sob, tightness in chest and coughing spells O2 3.5L/ Advanced Needed prednisone/ Augmentin in April Has home health CNA helping now.  Dyspnea w/ exertion, persistent w/o much change. Occ cough, wheeze, watery nose. Occ choke/ light aspiration. Not needing nebulizer currently. CXR 07/21/13 IMPRESSION:  1. There is mild hyperinflation consistent with COPD. There is no  evidence of pneumonia.  2. There is no evidence of CHF. There is no pleural effusion.  Electronically Signed  By: David Martinique  On: 07/21/2013 10:50  03/27/14-  57 yoF former smoker with COPD, complicated by chronic rhinitis, GERD,  DM FOLLOWS FOR:  Increased sob, tightness in chest and coughing spells O2 2-3.5L/ Advanced FOLLOWS FOR: O2 levels have been dropping in 80's, according to a caregiver who has her own oximeter- accuracy unknown. . SOB  as well. Occ wheeze and cough x several months- sometimes some green. Maintenance zithromax bronchitis suppression seems to work. CXR 02/19/14 IMPRESSION: Hyperinflation without acute finding. Electronically Signed  By: Lorin Picket M.D.  On: 02/19/2014 12:22  07/08/14- 59 yoF former smoker with COPD, complicated by chronic rhinitis, GERD,  DM FOLLOWS FOR: Pt reports breathing better on Stiolto. She had been off due to ins coverage, she is now back on since it is covered. She has not been  back on Stiolto 1 week yet.   ROS-see HPI Constitutional:   No-   weight loss, night sweats, fevers, chills, fatigue, lassitude. HEENT:   No-  headaches, difficulty swallowing, tooth/dental problems, sore throat,       No-  sneezing, itching, ear ache, nasal congestion, post nasal drip,  CV:  No-   chest pain, orthopnea, PND, swelling in lower extremities, anasarca,                                  dizziness, palpitations Resp: +shortness of  breath with exertion or at rest.              + productive cough,  + non-productive cough,  No- coughing up of blood.              No-   change in color of mucus.  + wheezing.   Skin: No-   rash or lesions. GI:  +heartburn, indigestion, No-abdominal pain, nausea, vomiting,  GU: . MS:  No-   joint pain or swelling. Neuro-     nothing unusual Psych:  No- change in mood or affect. +depression or anxiety.  No memory loss.    Objective:  OBJ- Physical Exam    talkative General- Alert, Oriented, Affect-appropriate, Distress- none acute, + overweight Skin- rash-none, lesions- none, excoriation- none Lymphadenopathy- none Head- atraumatic            Eyes- Gross vision intact, PERRLA, conjunctivae and secretions clear            Ears- Hearing, canals-normal            Nose- Clear, no-Septal dev, mucus, polyps, erosion, perforation             Throat- Mallampati IV , mucosa not red , drainage- none, tonsils- atrophic Neck- flexible , trachea midline, no stridor , thyroid nl, carotid no bruit Chest - symmetrical excursion , unlabored           Heart/CV- RRR , no murmur , no gallop  , no rub, nl s1 s2                           - JVD- none , edema- none, stasis changes- none, varices- none           Lung-  wheeze+trace, cough- none , dullness-none, rub- none           Chest wall-  Abd-  Br/ Gen/ Rectal- Not done, not indicated Extrem- cyanosis- none, clubbing, none, atrophy- none, strength- nl Neuro- grossly intact to observation

## 2014-07-10 ENCOUNTER — Other Ambulatory Visit: Payer: Self-pay | Admitting: *Deleted

## 2014-07-10 ENCOUNTER — Telehealth: Payer: Self-pay | Admitting: *Deleted

## 2014-07-10 MED ORDER — CHOLESTYRAMINE 4 G PO PACK: PACK | ORAL | Status: DC

## 2014-07-10 MED ORDER — DIPHENOXYLATE-ATROPINE 2.5-0.025 MG PO TABS: 1.0000 | ORAL_TABLET | Freq: Two times a day (BID) | ORAL | Status: DC | PRN

## 2014-07-10 NOTE — Telephone Encounter (Signed)
She can take cholestyramine twice a day.  Also can take Lomotil up to 3 times a day as needed, can prescribe 10 tablets.  Would recommend that she follow-up with Dr. [name] or partners for reevaluation

## 2014-07-10 NOTE — Telephone Encounter (Signed)
Noted, patient is aware, rx sent

## 2014-07-10 NOTE — Telephone Encounter (Signed)
Patient called, she said she's been feeling very weak and has had "severe" diarrhea for about a week, she said she takes her medication for it and it helps for about a day.  She's concerned she may be getting dehydrated and wanted to know if you think she should be on something else?  Please advise.

## 2014-07-14 ENCOUNTER — Other Ambulatory Visit: Payer: Self-pay | Admitting: *Deleted

## 2014-07-14 MED ORDER — PRAVASTATIN SODIUM 10 MG PO TABS: 10.0000 mg | ORAL_TABLET | Freq: Every day | ORAL | Status: DC

## 2014-07-17 ENCOUNTER — Ambulatory Visit (INDEPENDENT_AMBULATORY_CARE_PROVIDER_SITE_OTHER): Payer: Medicare Other | Admitting: Endocrinology

## 2014-07-17 ENCOUNTER — Other Ambulatory Visit (INDEPENDENT_AMBULATORY_CARE_PROVIDER_SITE_OTHER): Payer: Medicare Other

## 2014-07-17 ENCOUNTER — Other Ambulatory Visit: Payer: Self-pay | Admitting: *Deleted

## 2014-07-17 ENCOUNTER — Encounter: Payer: Self-pay | Admitting: Endocrinology

## 2014-07-17 VITALS — BP 155/70 | HR 88 | Temp 98.2°F | Resp 16 | Ht 63.0 in | Wt 200.4 lb

## 2014-07-17 DIAGNOSIS — I10 Essential (primary) hypertension: Secondary | ICD-10-CM | POA: Diagnosis not present

## 2014-07-17 DIAGNOSIS — E1165 Type 2 diabetes mellitus with hyperglycemia: Secondary | ICD-10-CM | POA: Diagnosis not present

## 2014-07-17 DIAGNOSIS — F411 Generalized anxiety disorder: Secondary | ICD-10-CM | POA: Diagnosis not present

## 2014-07-17 DIAGNOSIS — IMO0002 Reserved for concepts with insufficient information to code with codable children: Secondary | ICD-10-CM

## 2014-07-17 DIAGNOSIS — E785 Hyperlipidemia, unspecified: Secondary | ICD-10-CM

## 2014-07-17 DIAGNOSIS — R0600 Dyspnea, unspecified: Secondary | ICD-10-CM

## 2014-07-17 DIAGNOSIS — G500 Trigeminal neuralgia: Secondary | ICD-10-CM | POA: Diagnosis not present

## 2014-07-17 LAB — LIPID PANEL
CHOLESTEROL: 192 mg/dL (ref 0–200)
HDL: 48.3 mg/dL (ref >39.00)
NonHDL: 143.7
Total CHOL/HDL Ratio: 4
Triglycerides: 230 mg/dL — ABNORMAL HIGH (ref 0.0–149.0)
VLDL: 46 mg/dL — ABNORMAL HIGH (ref 0.0–40.0)

## 2014-07-17 LAB — T4, FREE: Free T4: 0.68 ng/dL (ref 0.60–1.60)

## 2014-07-17 LAB — COMPREHENSIVE METABOLIC PANEL
ALBUMIN: 4.2 g/dL (ref 3.5–5.2)
ALT: 18 U/L (ref 0–35)
AST: 27 U/L (ref 0–37)
Alkaline Phosphatase: 44 U/L (ref 39–117)
BUN: 14 mg/dL (ref 6–23)
CO2: 29 mEq/L (ref 19–32)
Calcium: 9.8 mg/dL (ref 8.4–10.5)
Chloride: 98 mEq/L (ref 96–112)
Creatinine, Ser: 0.71 mg/dL (ref 0.40–1.20)
GFR: 84.83 mL/min (ref >60.00)
Glucose, Bld: 227 mg/dL — ABNORMAL HIGH (ref 70–99)
POTASSIUM: 4.7 meq/L (ref 3.5–5.1)
Sodium: 135 mEq/L (ref 135–145)
Total Bilirubin: 0.2 mg/dL (ref 0.2–1.2)
Total Protein: 7.2 g/dL (ref 6.0–8.3)

## 2014-07-17 LAB — TSH: TSH: 2.14 u[IU]/mL (ref 0.35–4.50)

## 2014-07-17 LAB — LDL CHOLESTEROL, DIRECT: Direct LDL: 130 mg/dL

## 2014-07-17 LAB — HEMOGLOBIN A1C: Hgb A1c MFr Bld: 10 % — ABNORMAL HIGH (ref 4.6–6.5)

## 2014-07-17 MED ORDER — LORAZEPAM 0.5 MG PO TABS
0.5000 mg | ORAL_TABLET | Freq: Two times a day (BID) | ORAL | Status: DC | PRN
Start: 2014-07-17 — End: 2015-01-08

## 2014-07-17 MED ORDER — PRAVASTATIN SODIUM 20 MG PO TABS: 20.0000 mg | ORAL_TABLET | Freq: Every day | ORAL | Status: DC

## 2014-07-17 MED ORDER — OMEPRAZOLE 40 MG PO CPDR: 40.0000 mg | DELAYED_RELEASE_CAPSULE | Freq: Every day | ORAL | Status: DC

## 2014-07-17 NOTE — Patient Instructions (Addendum)
Stop Metformin  Insulin Novolog to 12 at all meals and upto 14 if over 200  Increase Pravastatin to 70m

## 2014-07-17 NOTE — Progress Notes (Signed)
Patient ID: [name], female   DOB: 1935/07/05, 79 y.o.   MRN: [id number]   Reason for Appointment: follow-up of multiple problems  History of Present Illness   Pain in the right temple area:  She continues to have  sharp shooting pain starting from her right eye going up to the scalp area. This is very transient and severe and was recommended gabapentin or Lyrica but she refuses to do this She wanted her to see a neurologist first.  DIABETES since 1980   She has been on insulin using Lantus and Humalog for several years and usually A1c around 8% despite fairly good compliance with diet, exercise and glucose monitoring. She also had been on Actos previously which probably benefited her especially with her fatty liver but she stopped it because of fear of side effects      Recent history:  Her A1c has been consistently around 9% She is taking relatively larger doses of Lantus in the morning now me previously was on around 40 units This is despite her being given a trial of metformin Also needing relatively larger doses of mealtime insulin but she does not go up on the dose since she thinks she is eating small portions  She does have the following patterns:  Overnight/early morning readings have been somewhat variable but mostly below 200 blood sugars tend to go higher midmorning usually  On an average blood sugars are recently the highest before and after supper   Blood sugars do not increase significantly after supper except on some days, checking blood sugars closer to bedtime.    She again does not take higher doses of mealtime insulin despite blood sugars being higher after most of her meals and this has been discussed with her before  Overall average blood sugar is nearly 200 again  No hypoglycemia  Insulin regimen: Lantus twice a day, 54--26 with syringe and Novolog 8-10  a.c. 3 times a day,  Oral hypoglycemic drugs: on Actos        Side effects from medications:  None         Proper timing of medications in relation to meals: Yes.         Monitors blood glucose:  5 times a day   Glucometer:  Accu-Chek         Blood Glucose averages recently from meter download recently :  PRE-MEAL Breakfast  8-11 AM   lunch   supper   at bedtime   Glucose range:  126-208   159-267   125-250   173-269  132-271   Mean/median:  164   210    224  219    Hypoglycemia:  none recently        Meals: 3 meals per day. Sometimes drinking milk with meals, lunch 1-2 pm Supper 6 to 7 PM     Physical activity: exercise: Unable to do any because of dyspnea and fatigue           Dietician visit: Most recent: Several years ago           Complications: are: Peripheral neuropathy with sensory loss     Wt Readings from Last 3 Encounters:  07/17/14 200 lb 6.4 oz (90.901 kg)  07/08/14 202 lb (91.627 kg)  07/03/14 202 lb (91.627 kg)    Lab Results  Component Value Date   HGBA1C 9.3* 03/18/2014   HGBA1C 8.9* 12/16/2013   HGBA1C 9.0* 09/15/2013   Lab Results  Component Value Date  MICROALBUR 0.6 03/18/2014   LDLCALC 124* 12/16/2013   CREATININE 0.7 03/18/2014       Medication List       This list is accurate as of: 07/17/14 11:02 AM.  Always use your most recent med list.               acetaminophen 500 MG tablet  Commonly known as:  TYLENOL  Take 500 mg by mouth every 6 (six) hours as needed for pain.     albuterol (2.5 MG/3ML) 0.083% nebulizer solution  Commonly known as:  PROVENTIL  Take 3 mLs (2.5 mg total) by nebulization every 6 (six) hours as needed for wheezing or shortness of breath.     ALPRAZolam 0.5 MG tablet  Commonly known as:  XANAX  Take 1/2 tablet to 1 tablet two times a day     aspirin 325 MG tablet  Take 325 mg by mouth daily.     B-D INS SYRINGE 0.5CC/31GX5/16 31G X 5/16" 0.5 ML Misc  Generic drug:  Insulin Syringe-Needle U-100     BD INSULIN SYRINGE ULTRAFINE 31G X 15/64" 0.5 ML Misc  Generic drug:  Insulin Syringe-Needle U-100      bacitracin ophthalmic ointment     cholestyramine 4 G packet  Commonly known as:  QUESTRAN  Take one package up to 2times a day as needed for diarrhea.     dicyclomine 10 MG capsule  Commonly known as:  BENTYL  Take 1 capsule by mouth once daily     diphenoxylate-atropine 2.5-0.025 MG per tablet  Commonly known as:  LOMOTIL  Take 1 tablet by mouth 2 (two) times daily as needed for diarrhea or loose stools.     fenofibrate 145 MG tablet  Commonly known as:  TRICOR  Take 1 tablet (145 mg total) by mouth daily.     fluconazole 150 MG tablet  Commonly known as:  DIFLUCAN  1 tablet daily for 4 days for yeast infection     glucose blood test strip  Commonly known as:  ACCU-CHEK AVIVA PLUS  Use as instructed to check blood sugars 8 times per day dx code 250.02     HYDROcodone-acetaminophen 5-325 MG per tablet  Commonly known as:  NORCO/VICODIN  Take 1 tablet by mouth 3 (three) times daily as needed for moderate pain.     INCRUSE ELLIPTA 62.5 MCG/INH Aepb  Generic drug:  Umeclidinium Bromide     insulin aspart 100 UNIT/ML FlexPen  Commonly known as:  NOVOLOG FLEXPEN  Inject 15 units three times a day     insulin glargine 100 UNIT/ML injection  Commonly known as:  LANTUS  Take 45 units in the morning and 30 units at bedtime     ipratropium 17 MCG/ACT inhaler  Commonly known as:  ATROVENT HFA  Inhale 2 puffs into the lungs 2 (two) times daily as needed (shortness of breath).     ketoconazole 2 % cream  Commonly known as:  NIZORAL  Apply 1 application topically daily.     levalbuterol 45 MCG/ACT inhaler  Commonly known as:  XOPENEX HFA  Inhale 2 puffs into the lungs every 6 (six) hours as needed for wheezing or shortness of breath.     magic mouthwash Soln  Swish with 2 teaspoonfuls by mouth as needed     meclizine 25 MG tablet  Commonly known as:  ANTIVERT  Take 1 tablet (25 mg total) by mouth 3 (three) times daily as needed for dizziness.  metFORMIN 500 MG 24  hr tablet  Commonly known as:  GLUCOPHAGE-XR  Take 2 tablets (1,000 mg total) by mouth daily with supper.     metoprolol succinate 25 MG 24 hr tablet  Commonly known as:  TOPROL-XL  Take 1 tablet (25 mg total) by mouth daily.     nystatin 100000 UNIT/ML suspension  Commonly known as:  MYCOSTATIN     omeprazole 20 MG capsule  Commonly known as:  PRILOSEC  Take 1 capsule (20 mg total) by mouth daily.     pioglitazone 15 MG tablet  Commonly known as:  ACTOS  Take 1 tablet (15 mg total) by mouth daily.     pravastatin 10 MG tablet  Commonly known as:  PRAVACHOL  Take 1 tablet (10 mg total) by mouth daily.     Tiotropium Bromide-Olodaterol 2.5-2.5 MCG/ACT Aers  Commonly known as:  STIOLTO RESPIMAT  Inhale 2 puffs into the lungs daily.     trimethoprim 100 MG tablet  Commonly known as:  TRIMPEX     valsartan 80 MG tablet  Commonly known as:  DIOVAN  Take 1 tablet (80 mg total) by mouth daily.     VIGAMOX 0.5 % ophthalmic solution  Generic drug:  moxifloxacin        Allergies:  Allergies  Allergen Reactions  . Chlordiazepoxide-Clidinium Other (See Comments)    Sleepy,weak  . Biaxin [Clarithromycin]     Foul taste, abd pain, diarrhea  . Codeine     REACTION: gi upset  . Tramadol     Past Medical History  Diagnosis Date  . Irritable bowel syndrome   . Other chronic nonalcoholic liver disease   . Esophageal reflux   . Allergic rhinitis, cause unspecified     Sinus CT Rec 12-23-2009  . 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 Sciatica   . Shingles 2010  . Steatohepatitis   . Diabetes mellitus   . Diverticulosis   . Hiatal hernia   . Esophageal stricture   . Diarrhea   . GERD (gastroesophageal reflux disease)   . Shortness of breath   . Hypertension   . Arthritis   . Glaucoma   . Complication of anesthesia     takes along time to wake up  . Oxygen deficiency   . Benign paroxysmal positional vertigo  08/09/2012  . Neuropathy in diabetes 08/09/2012    Past Surgical History  Procedure Laterality Date  . Cholecystectomy open  1978  . Liver biopsy  08-1992  . Parathyroid exploration    . Tonsillectomy and adenoidectomy    . Total abdominal hysterectomy    . Esophagogastroduodenoscopy  6195,09-32    H Hernia,es.stricture s/p dil 39F  . Colonoscopy  07-2001    mild diverticulosis  . Carpal tunnel release      right hand  . Cataract extraction      Bilateral  . Ulnar nerve transposition  12/07/2011    Procedure: ULNAR NERVE DECOMPRESSION/TRANSPOSITION;this was cancelled-not done  Surgeon: Cammie Sickle., MD;  Location: [facility];  Service: Orthopedics;  Laterality: Right;  right ulnar nerve in situ decompression  . Ulnar tunnel release  03/07/2012    Procedure: CUBITAL TUNNEL RELEASE;  Surgeon: Roseanne Kaufman, MD;  Location: [facility];  Service: Orthopedics;  Laterality: Right;  ulnar nerve release at the elbow      Family History  Problem Relation Age of Onset  .  Lung cancer Mother     small cell;Byssinosis  . Heart disease Father   . Lung cancer Sister   . Liver cancer Sister     ? mets from another area of the body  . Diabetes      grandmother  . Stroke Maternal Grandfather     Social History:  reports that she quit smoking about 26 years ago. Her smoking use included Cigarettes. She has never used smokeless tobacco. She reports that she does not drink alcohol or use illicit drugs.  Review of Systems:  HYPERTENSION:   blood pressure is Usuallywell-controlled with 80 mg Diovan , was significantly high in the office on the first measurement because of her having severe coughing episode .     HYPERLIPIDEMIA: The lipid abnormality consists of elevated LDL treated with pravastatin  10 mg only which she has no side effects from     Since her triglycerides were relatively higher she had also been given fenofibrate but  needs recent levels   Lab  Results  Component Value Date   CHOL 204* 03/18/2014   HDL 42.20 03/18/2014   LDLCALC 124* 12/16/2013   LDLDIRECT 127.2 03/18/2014   TRIG 244.0* 03/18/2014   CHOLHDL 5 03/18/2014    She is followed by pulmonologist for chronic COPD, taking home oxygen and still having  cough, dyspnea and recurrent bronchitis  Dizziness- during her hospitalization  possible BPPV was diagnosed.   Vestibular PT was done which was helpful but she still has episodes of dizziness occasionally  Asking about whether she is getting panic attacks when she has sudden onset of feeling slightly weak and dizzy but does not have usual symptoms of anxiety     Continues to complain about lack of energy and marked weakness.  No etiology found   Lab Results  Component Value Date   TSH 0.55 09/15/2013    Diarrhea: She has had long-standing intermittent diarrhea possibly I BS. Is taking Questran as needed, upto qd, does not take it twice a day because of fear of constipation.  Occasionally takes Lomotil.  Does not want to see a GI doctor again   Foot exam done in 8/15: Foot exam shows sensory loss distally, normal pulses and no skin lesions   Examination:   BP 178/64 mmHg  Pulse 88  Temp(Src) 98.2 F (36.8 C)  Resp 16  Ht 5' 3"  (1.6 m)  Wt 200 lb 6.4 oz (90.901 kg)  BMI 35.51 kg/m2  SpO2 92%  Body mass index is 35.51 kg/(m^2).   Repeat blood pressure 155/70   no ankle edema   ASSESSMENT/ PLAN:   Diabetes type 2   The patient's diabetes control is  overall  inadequate again with high readingsOverall especially  in the afternoons and evenings She is requiring larger doses of insulin especially basal in the morning since she is not benefiting from this and getting diarrhea we'll stop this now  Since she had previously taking metformin and tolerated at least on 1000 mg; she will  stop this now since she is getting significant diarrhea and not much benefit on her blood sugar control May consider  restarting Actos  Her portion control is fairly good, weight is stable Discussed needing to increase her mealtime coverage also because of her getting more insulin resistant even though she is not eating a lot of carbohydrates Advised her to titrate this further if postprandial readings are still at least over 180 May also need increasing doses of Lantus in  the morning at suppertime readings continue to be high, but sugar targets discussed  HYPERTENSION: Well-controlled as blood pressure has been usually fairly good except today  Hyperlipidemia: She will need to have lipids to be rechecked   Consider increasing pravastatin if LDL is still high as she is taking only 10 mg   Anxiety: She will try lorazepam as she gets sleepy from Xanax  Vertigo/dizziness episodes: This is less frequent  Counseling time over 50% of today's 25 minute visit   Patient Instructions  Stop Metformin  Insulin Novolog to 12 at all meals and upto 14 if over 200  Increase Pravastatin to 79m    addendum: A1c 10% and will increase Lantus by another 4 units, increase pravastatin also   Mical Kicklighter 07/17/2014, 11:02 AM   Lab on 07/17/2014  Component Date Value Ref Range Status  . Sodium 07/17/2014 135  135 - 145 mEq/L Final  . Potassium 07/17/2014 4.7  3.5 - 5.1 mEq/L Final  . Chloride 07/17/2014 98  96 - 112 mEq/L Final  . CO2 07/17/2014 29  19 - 32 mEq/L Final  . Glucose, Bld 07/17/2014 227* 70 - 99 mg/dL Final  . BUN 07/17/2014 14  6 - 23 mg/dL Final  . Creatinine, Ser 07/17/2014 0.71  0.40 - 1.20 mg/dL Final  . Total Bilirubin 07/17/2014 0.2  0.2 - 1.2 mg/dL Final  . Alkaline Phosphatase 07/17/2014 44  39 - 117 U/L Final  . AST 07/17/2014 27  0 - 37 U/L Final  . ALT 07/17/2014 18  0 - 35 U/L Final  . Total Protein 07/17/2014 7.2  6.0 - 8.3 g/dL Final  . Albumin 07/17/2014 4.2  3.5 - 5.2 g/dL Final  . Calcium 07/17/2014 9.8  8.4 - 10.5 mg/dL Final  . GFR 07/17/2014 84.83  >60.00 mL/min Final  .  Cholesterol 07/17/2014 192  0 - 200 mg/dL Final   ATP III Classification       Desirable:  < 200 mg/dL               Borderline High:  200 - 239 mg/dL          High:  > = 240 mg/dL  . Triglycerides 07/17/2014 230.0* 0.0 - 149.0 mg/dL Final   Normal:  <150 mg/dLBorderline High:  150 - 199 mg/dL  . HDL 07/17/2014 48.30  >39.00 mg/dL Final  . VLDL 07/17/2014 46.0* 0.0 - 40.0 mg/dL Final  . Total CHOL/HDL Ratio 07/17/2014 4   Final                  Men          Women1/2 Average Risk     3.4          3.3Average Risk          5.0          4.42X Average Risk          9.6          7.13X Average Risk          15.0          11.0                      . NonHDL 07/17/2014 143.70   Final   NOTE:  Non-HDL goal should be 30 mg/dL higher than patient's LDL goal (i.e. LDL goal of < 70 mg/dL, would have non-HDL goal of < 100 mg/dL)  . TSH 07/17/2014 2.14  0.35 - 4.50 uIU/mL Final  . Free T4 07/17/2014 0.68  0.60 - 1.60 ng/dL Final  . Hgb A1c MFr Bld 07/17/2014 10.0* 4.6 - 6.5 % Final   Glycemic Control Guidelines for People with Diabetes:Non Diabetic:  <6%Goal of Therapy: <7%Additional Action Suggested:  >8%   . Direct LDL 07/17/2014 130.0   Final   Optimal:  <100 mg/dLNear or Above Optimal:  100-129 mg/dLBorderline High:  130-159 mg/dLHigh:  160-189 mg/dLVery High:  >190 mg/dL

## 2014-07-19 NOTE — Progress Notes (Signed)
Quick Note:  A1c 10%, need to increase her Lantus by 2 units twice a day Cholesterol still high, use 20 mg pravastatin as discussed Other tests okay ______

## 2014-07-23 ENCOUNTER — Telehealth: Payer: Self-pay | Admitting: Endocrinology

## 2014-07-23 NOTE — Telephone Encounter (Signed)
Contacted [name] and advised of note below. [name] states that she has already seen Dr. Tomi Likens and he wanted her to do a MRI. [name] declined to do the MRI because she is scared to do it. [name] stated she did not want go back and see Dr. Tomi Likens and trusted your judgment on how she should proceed with the headaches she has been having.  Please advise.  Thanks!

## 2014-07-23 NOTE — Telephone Encounter (Signed)
There is no problem with taking lorazepam/Ativan as long as she does not take Xanax at the same time.  We can have her see the neurologist for headaches if she wants

## 2014-07-23 NOTE — Telephone Encounter (Signed)
Wants [name] that the ativan might interfere with her other meds and she has not taken it. Her pharmacist told her about the possible interactions. She is shaking really bad, headaches are getting worse. She needs something to help her

## 2014-07-23 NOTE — Telephone Encounter (Signed)
See note below and please advise, Thanks! 

## 2014-07-24 NOTE — Telephone Encounter (Signed)
Would recommend an antianxiety/antidepressant  medication Effexor XR 37.5 mg daily for headaches and anxiety.  Otherwise would like to have her see a headache specialist

## 2014-07-31 ENCOUNTER — Ambulatory Visit: Payer: Medicare Other | Admitting: Internal Medicine

## 2014-08-12 ENCOUNTER — Telehealth: Payer: Self-pay | Admitting: Internal Medicine

## 2014-08-12 MED ORDER — HYDROCODONE-HOMATROPINE 5-1.5 MG/5ML PO SYRP: 5.0000 mL | ORAL_SOLUTION | Freq: Four times a day (QID) | ORAL | Status: DC | PRN

## 2014-08-12 NOTE — Telephone Encounter (Signed)
Spoke with patient, aware of rec's per [facility] Rx printed, placed in Narcotic folder, placed on CY cart to sign. This will need to be mailed to patient home addressed once signed.

## 2014-08-12 NOTE — Telephone Encounter (Signed)
Rx signed and placed into outgoing mail. Patient aware that since it is after 2:30 her Rx will more than likely be delayed x 1 day.  Nothing further needed.

## 2014-08-12 NOTE — Telephone Encounter (Signed)
Offer mail Rx for hydromet for cough  140 ml, 5 ml every 6 hours if needed for cough  Stiolto wouldn't cause the chest pain. More likely her hard cough cracked a rib or tore a little muscle and this will take time to heal.

## 2014-08-12 NOTE — Telephone Encounter (Signed)
Spoke with patient and she does not want to try Effexor at this time because her head pain is not daily.  She will call back if she would like to try the medication.

## 2014-08-12 NOTE — Telephone Encounter (Signed)
Spoke with the pt  She states that her cough is no better since the last visit here on 07/08/14  She states cough is non prod  She coughed so hard last night "felt like I was gonna cough my brains out" After this violent coughing spell she developed sharp pain in her right side rib area that lasted approx 2 minutes  She wants to to if her new inhaler stiolto could have anything to do with the pain  I advised it sounds like it came from the cough, and she states "I cough like this all the time" CDY, please advise thanks! Allergies  Allergen Reactions  . Chlordiazepoxide-Clidinium Other (See Comments)    Sleepy,weak  . Biaxin [Clarithromycin]     Foul taste, abd pain, diarrhea  . Codeine     REACTION: gi upset  . Tramadol    Current Outpatient Prescriptions on File Prior to Visit  Medication Sig Dispense Refill  . acetaminophen (TYLENOL) 500 MG tablet Take 500 mg by mouth every 6 (six) hours as needed for pain.     Marland Kitchen albuterol (PROVENTIL) (2.5 MG/3ML) 0.083% nebulizer solution Take 3 mLs (2.5 mg total) by nebulization every 6 (six) hours as needed for wheezing or shortness of breath. 25 vial prn  . ALPRAZolam (XANAX) 0.5 MG tablet Take 1/2 tablet to 1 tablet two times a day 60 tablet 5  . Alum & Mag Hydroxide-Simeth (MAGIC MOUTHWASH) SOLN Swish with 2 teaspoonfuls by mouth as needed 240 mL 1  . aspirin 325 MG tablet Take 325 mg by mouth daily.      . B-D INS SYRINGE 0.5CC/31GX5/16 31G X 5/16" 0.5 ML MISC     . bacitracin ophthalmic ointment   0  . BD INSULIN SYRINGE ULTRAFINE 31G X 15/64" 0.5 ML MISC   1  . cholestyramine (QUESTRAN) 4 G packet Take one package up to 2times a day as needed for diarrhea. 60 each 1  . dicyclomine (BENTYL) 10 MG capsule Take 1 capsule by mouth once daily 30 capsule 5  . diphenoxylate-atropine (LOMOTIL) 2.5-0.025 MG per tablet Take 1 tablet by mouth 2 (two) times daily as needed for diarrhea or loose stools. 10 tablet 0  . fenofibrate (TRICOR) 145 MG tablet  Take 1 tablet (145 mg total) by mouth daily. 30 tablet 5  . fluconazole (DIFLUCAN) 150 MG tablet 1 tablet daily for 4 days for yeast infection 4 tablet 1  . glucose blood (ACCU-CHEK AVIVA PLUS) test strip Use as instructed to check blood sugars 8 times per day dx code 250.02 250 each 3  . HYDROcodone-acetaminophen (NORCO/VICODIN) 5-325 MG per tablet Take 1 tablet by mouth 3 (three) times daily as needed for moderate pain. 90 tablet 0  . INCRUSE ELLIPTA 62.5 MCG/INH AEPB   0  . insulin aspart (NOVOLOG FLEXPEN) 100 UNIT/ML FlexPen Inject 15 units three times a day 15 mL 3  . insulin glargine (LANTUS) 100 UNIT/ML injection Take 45 units in the morning and 30 units at bedtime 30 mL 3  . ipratropium (ATROVENT HFA) 17 MCG/ACT inhaler Inhale 2 puffs into the lungs 2 (two) times daily as needed (shortness of breath). 1 Inhaler 11  . ketoconazole (NIZORAL) 2 % cream Apply 1 application topically daily. 60 g 0  . levalbuterol (XOPENEX HFA) 45 MCG/ACT inhaler Inhale 2 puffs into the lungs every 6 (six) hours as needed for wheezing or shortness of breath. 1 Inhaler 11  . LORazepam (ATIVAN) 0.5 MG tablet Take 1 tablet (0.5 mg  total) by mouth 2 (two) times daily as needed for anxiety. 30 tablet 1  . meclizine (ANTIVERT) 25 MG tablet Take 1 tablet (25 mg total) by mouth 3 (three) times daily as needed for dizziness. 90 tablet 3  . metoprolol succinate (TOPROL-XL) 25 MG 24 hr tablet Take 1 tablet (25 mg total) by mouth daily. 30 tablet 3  . nystatin (MYCOSTATIN) 100000 UNIT/ML suspension     . omeprazole (PRILOSEC) 40 MG capsule Take 1 capsule (40 mg total) by mouth daily. 30 capsule 2  . pravastatin (PRAVACHOL) 20 MG tablet Take 1 tablet (20 mg total) by mouth daily. 30 tablet 2  . Tiotropium Bromide-Olodaterol (STIOLTO RESPIMAT) 2.5-2.5 MCG/ACT AERS Inhale 2 puffs into the lungs daily. 1 Inhaler 11  . trimethoprim (TRIMPEX) 100 MG tablet   1  . valsartan (DIOVAN) 80 MG tablet Take 1 tablet (80 mg total) by mouth  daily. 30 tablet 5  . VIGAMOX 0.5 % ophthalmic solution      No current facility-administered medications on file prior to visit.

## 2014-08-14 ENCOUNTER — Telehealth: Payer: Self-pay | Admitting: Endocrinology

## 2014-08-14 NOTE — Telephone Encounter (Signed)
Pt having trouble with sugar. Would not go into detail with me.

## 2014-08-17 NOTE — Telephone Encounter (Signed)
Pt states that nothing that she has not been under 300 with or without eating.   Nutritionist may be needed.

## 2014-08-17 NOTE — Telephone Encounter (Signed)
Need to know exactly what her blood sugar levels are drawn today for the last 2 days.  Meanwhile she can increase her Lantus by 6 units twice a day and Humalog by 4 units with every meal. Also start pioglitazone 15 mg daily

## 2014-08-17 NOTE — Telephone Encounter (Signed)
Pt is distressed.

## 2014-08-18 ENCOUNTER — Telehealth: Payer: Self-pay | Admitting: Internal Medicine

## 2014-08-18 ENCOUNTER — Telehealth: Payer: Self-pay | Admitting: Endocrinology

## 2014-08-18 MED ORDER — PROMETHAZINE-CODEINE 6.25-10 MG/5ML PO SYRP: 5.0000 mL | ORAL_SOLUTION | Freq: Four times a day (QID) | ORAL | Status: DC | PRN

## 2014-08-18 NOTE — Telephone Encounter (Signed)
Please tell her that we have listed as allergic to codeine. If she can take a codeine cough syrup, then please remove codeine from allergy list and remove hycodan.             Rx prometh codeine 200 ml, 5 ml every 6 hours if needed for cough

## 2014-08-18 NOTE — Telephone Encounter (Signed)
Spoke with pt, states that she only has GI upset as listed on her allergy list in large doses, but she tolerated the prometh-codeine syrup well.  I've edited her allergy list to reflect this, and called in cough syrup to pharmacy.  Nothing further needed.

## 2014-08-18 NOTE — Telephone Encounter (Signed)
Pt needs call back she has the figures you asked for

## 2014-08-18 NOTE — Telephone Encounter (Signed)
Spoke with [name], states that she is still coughing and cannot afford the $40 copay for her hycodan cough syrup that was recommended.  [name] is requesting further recs that are cheaper.  [name] states that she has taken promethazine-codeine cough syrup that only cost her $13 in the past.    Dr. Annamaria Boots please advise.  Thanks!  Allergies  Allergen Reactions  . Chlordiazepoxide-Clidinium Other (See Comments)    Sleepy,weak  . Biaxin [Clarithromycin]     Foul taste, abd pain, diarrhea  . Codeine     REACTION: gi upset  . Tramadol    Current Outpatient Prescriptions on File Prior to Visit  Medication Sig Dispense Refill  . acetaminophen (TYLENOL) 500 MG tablet Take 500 mg by mouth every 6 (six) hours as needed for pain.     Marland Kitchen albuterol (PROVENTIL) (2.5 MG/3ML) 0.083% nebulizer solution Take 3 mLs (2.5 mg total) by nebulization every 6 (six) hours as needed for wheezing or shortness of breath. 25 vial prn  . ALPRAZolam (XANAX) 0.5 MG tablet Take 1/2 tablet to 1 tablet two times a day 60 tablet 5  . Alum & Mag Hydroxide-Simeth (MAGIC MOUTHWASH) SOLN Swish with 2 teaspoonfuls by mouth as needed 240 mL 1  . aspirin 325 MG tablet Take 325 mg by mouth daily.      . B-D INS SYRINGE 0.5CC/31GX5/16 31G X 5/16" 0.5 ML MISC     . bacitracin ophthalmic ointment   0  . BD INSULIN SYRINGE ULTRAFINE 31G X 15/64" 0.5 ML MISC   1  . cholestyramine (QUESTRAN) 4 G packet Take one package up to 2times a day as needed for diarrhea. 60 each 1  . dicyclomine (BENTYL) 10 MG capsule Take 1 capsule by mouth once daily 30 capsule 5  . diphenoxylate-atropine (LOMOTIL) 2.5-0.025 MG per tablet Take 1 tablet by mouth 2 (two) times daily as needed for diarrhea or loose stools. 10 tablet 0  . fenofibrate (TRICOR) 145 MG tablet Take 1 tablet (145 mg total) by mouth daily. 30 tablet 5  . fluconazole (DIFLUCAN) 150 MG tablet 1 tablet daily for 4 days for yeast infection 4 tablet 1  . glucose blood (ACCU-CHEK AVIVA PLUS) test strip Use as  instructed to check blood sugars 8 times per day dx code 250.02 250 each 3  . HYDROcodone-acetaminophen (NORCO/VICODIN) 5-325 MG per tablet Take 1 tablet by mouth 3 (three) times daily as needed for moderate pain. 90 tablet 0  . HYDROcodone-homatropine (HYCODAN) 5-1.5 MG/5ML syrup Take 5 mLs by mouth every 6 (six) hours as needed for cough. 140 mL 0  . INCRUSE ELLIPTA 62.5 MCG/INH AEPB   0  . insulin aspart (NOVOLOG FLEXPEN) 100 UNIT/ML FlexPen Inject 15 units three times a day 15 mL 3  . insulin glargine (LANTUS) 100 UNIT/ML injection Take 45 units in the morning and 30 units at bedtime 30 mL 3  . ipratropium (ATROVENT HFA) 17 MCG/ACT inhaler Inhale 2 puffs into the lungs 2 (two) times daily as needed (shortness of breath). 1 Inhaler 11  . ketoconazole (NIZORAL) 2 % cream Apply 1 application topically daily. 60 g 0  . levalbuterol (XOPENEX HFA) 45 MCG/ACT inhaler Inhale 2 puffs into the lungs every 6 (six) hours as needed for wheezing or shortness of breath. 1 Inhaler 11  . LORazepam (ATIVAN) 0.5 MG tablet Take 1 tablet (0.5 mg total) by mouth 2 (two) times daily as needed for anxiety. 30 tablet 1  . meclizine (ANTIVERT) 25 MG tablet Take 1  tablet (25 mg total) by mouth 3 (three) times daily as needed for dizziness. 90 tablet 3  . metoprolol succinate (TOPROL-XL) 25 MG 24 hr tablet Take 1 tablet (25 mg total) by mouth daily. 30 tablet 3  . nystatin (MYCOSTATIN) 100000 UNIT/ML suspension     . omeprazole (PRILOSEC) 40 MG capsule Take 1 capsule (40 mg total) by mouth daily. 30 capsule 2  . pravastatin (PRAVACHOL) 20 MG tablet Take 1 tablet (20 mg total) by mouth daily. 30 tablet 2  . Tiotropium Bromide-Olodaterol (STIOLTO RESPIMAT) 2.5-2.5 MCG/ACT AERS Inhale 2 puffs into the lungs daily. 1 Inhaler 11  . trimethoprim (TRIMPEX) 100 MG tablet   1  . valsartan (DIOVAN) 80 MG tablet Take 1 tablet (80 mg total) by mouth daily. 30 tablet 5  . VIGAMOX 0.5 % ophthalmic solution      No current  facility-administered medications on file prior to visit.

## 2014-08-18 NOTE — Telephone Encounter (Signed)
Pt informed of MD response.   Pt will call back with all of her sugar numbers.   PA needed for 40 mg Prilosec.

## 2014-08-19 ENCOUNTER — Telehealth: Payer: Self-pay | Admitting: Endocrinology

## 2014-08-19 NOTE — Telephone Encounter (Signed)
Patient called and was advised to give blood sugar readings   4.9.16  7:23 am 159 4:04 pm 210  4.10.16 7:34 am 156 10:47 am 203 2:45 pm 253 5:14 pm 236 9:12 pm 300 10:55 pm   4.11.16 4:09 am 221 7:49 am 177 12:22 pm 237 2:31 pm 224 5:39 pm 168 9:13 pm 306 11:31 pm 299   4.12.16  7:40 am 140 2:56 am 272  4.13.16 4:13 am 90 7:55 am 111  Patient states one day she had a bout of dizziness and trembling like a leaf; she then ate a peanut butter cracker the trembling stopped and she felt better    Please advise patient    Thank you

## 2014-08-19 NOTE — Telephone Encounter (Signed)
Reduce Lantus by 4 units in the evening and 2 units in the morning from current dose.  Also confirm what dose she is taking.  Continue same dose of Humalog

## 2014-08-19 NOTE — Telephone Encounter (Signed)
Please read message below and advise [employer].

## 2014-08-19 NOTE — Telephone Encounter (Signed)
Called pt and advised her per Dr Ronnie Derby message below. Pt voiced understanding. Pt voiced concern for the high blood sugar readings. Be advised.

## 2014-09-02 ENCOUNTER — Telehealth: Payer: Self-pay | Admitting: *Deleted

## 2014-09-02 ENCOUNTER — Other Ambulatory Visit: Payer: Self-pay | Admitting: *Deleted

## 2014-09-02 MED ORDER — VENLAFAXINE HCL ER 37.5 MG PO CP24: ORAL_CAPSULE | ORAL | Status: DC

## 2014-09-02 NOTE — Telephone Encounter (Signed)
Patient is aware, rx sent for Effexor.

## 2014-09-02 NOTE — Telephone Encounter (Signed)
Patient was wondering if Paxil would be more appropriate for her instead of Effexor?

## 2014-09-02 NOTE — Telephone Encounter (Signed)
Patient called back, she said she's still having the dizziness, she said it seems when she gets upset she starts trembling and shaking, she thinks it's probably a panic attack but she's still getting very dizzy.  Please advise

## 2014-09-02 NOTE — Telephone Encounter (Signed)
No, Paxil causes wt gain

## 2014-09-11 ENCOUNTER — Other Ambulatory Visit: Payer: Self-pay | Admitting: *Deleted

## 2014-09-11 MED ORDER — INSULIN ASPART 100 UNIT/ML FLEXPEN: PEN_INJECTOR | SUBCUTANEOUS | Status: DC

## 2014-09-16 DIAGNOSIS — N3020 Other chronic cystitis without hematuria: Secondary | ICD-10-CM | POA: Diagnosis not present

## 2014-09-16 DIAGNOSIS — N3941 Urge incontinence: Secondary | ICD-10-CM | POA: Diagnosis not present

## 2014-09-25 ENCOUNTER — Telehealth: Payer: Self-pay

## 2014-09-25 NOTE — Telephone Encounter (Signed)
Yes she needs to increase her morning Lantus by 6 units and Humalog by 4 units at each meal

## 2014-09-25 NOTE — Telephone Encounter (Signed)
rx request for alprazolam 0.5 mg sent to [facility]

## 2014-09-25 NOTE — Telephone Encounter (Signed)
Dr. Annamaria Boots rxed pt a new inhaler called stiolto respirate. Pt states that her sugar is rising and she thinks that it is due to the new inhaler.  Sugars are really high at about 300 during the day and around 188-220 in the morning.  Should the pt increase her insulin? Please advise

## 2014-09-25 NOTE — Telephone Encounter (Signed)
Pt informed of recommendation.

## 2014-09-28 ENCOUNTER — Other Ambulatory Visit: Payer: Self-pay

## 2014-09-28 MED ORDER — ALPRAZOLAM 0.5 MG PO TABS: ORAL_TABLET | ORAL | Status: DC

## 2014-10-06 ENCOUNTER — Telehealth: Payer: Self-pay | Admitting: Internal Medicine

## 2014-10-06 MED ORDER — AZITHROMYCIN 250 MG PO TABS
ORAL_TABLET | ORAL | Status: DC
Start: 2014-10-06 — End: 2014-10-14

## 2014-10-06 NOTE — Telephone Encounter (Signed)
I don't think the Stiolto is to blame, ok to continue  Ok to [place] she feels better soon

## 2014-10-06 NOTE — Telephone Encounter (Signed)
Patient notified. Rx sent to pharmacy. Nothing further needed.  

## 2014-10-06 NOTE — Telephone Encounter (Signed)
Patient has been taking Stiolto.  It has increased her blood sugars, this is being managed by Dr. Dwyane Dee.  However, now she has a terrible cough, some pale green mucus.  Coughing to the point she is short of breath.  Takes Promethazine-CDN when she has too and it helps, but she is concerned that she may have bronchitis since she is coughing up green mucus.  No chest tightness.  Having to use rescue inhaler more than usual.  Patient says that she is concerned also that something may be wrong with her throat, she says she can take a drink of water and when it hits the back of her throat, she starts coughing.  Patient requesting a Zpak.  Also wants to know if she should continue the [place]? Uses [facility] [address]    Allergies  Allergen Reactions  . Chlordiazepoxide-Clidinium Other (See Comments)    Sleepy,weak  . Biaxin [Clarithromycin]     Foul taste, abd pain, diarrhea  . Tramadol   . Codeine     REACTION: gi upset- only in high doses per pt.  Can tolerate cough syrup with codeine.    Current Outpatient Prescriptions on File Prior to Visit  Medication Sig Dispense Refill  . acetaminophen (TYLENOL) 500 MG tablet Take 500 mg by mouth every 6 (six) hours as needed for pain.     Marland Kitchen albuterol (PROVENTIL) (2.5 MG/3ML) 0.083% nebulizer solution Take 3 mLs (2.5 mg total) by nebulization every 6 (six) hours as needed for wheezing or shortness of breath. 25 vial prn  . ALPRAZolam (XANAX) 0.5 MG tablet Take 1/2 tablet to 1 tablet two times a day 60 tablet 5  . Alum & Mag Hydroxide-Simeth (MAGIC MOUTHWASH) SOLN Swish with 2 teaspoonfuls by mouth as needed 240 mL 1  . aspirin 325 MG tablet Take 325 mg by mouth daily.      . B-D INS SYRINGE 0.5CC/31GX5/16 31G X 5/16" 0.5 ML MISC     . bacitracin ophthalmic ointment   0  . BD INSULIN SYRINGE ULTRAFINE 31G X 15/64" 0.5 ML MISC   1  . cholestyramine (QUESTRAN) 4 G packet Take one package up to 2times a day as needed for diarrhea. 60 each 1  . dicyclomine  (BENTYL) 10 MG capsule Take 1 capsule by mouth once daily 30 capsule 5  . diphenoxylate-atropine (LOMOTIL) 2.5-0.025 MG per tablet Take 1 tablet by mouth 2 (two) times daily as needed for diarrhea or loose stools. 10 tablet 0  . fenofibrate (TRICOR) 145 MG tablet Take 1 tablet (145 mg total) by mouth daily. 30 tablet 5  . fluconazole (DIFLUCAN) 150 MG tablet 1 tablet daily for 4 days for yeast infection 4 tablet 1  . glucose blood (ACCU-CHEK AVIVA PLUS) test strip Use as instructed to check blood sugars 8 times per day dx code 250.02 250 each 3  . HYDROcodone-acetaminophen (NORCO/VICODIN) 5-325 MG per tablet Take 1 tablet by mouth 3 (three) times daily as needed for moderate pain. 90 tablet 0  . INCRUSE ELLIPTA 62.5 MCG/INH AEPB   0  . insulin aspart (NOVOLOG FLEXPEN) 100 UNIT/ML FlexPen Inject 15 units three times a day 15 mL 3  . insulin glargine (LANTUS) 100 UNIT/ML injection Take 45 units in the morning and 30 units at bedtime 30 mL 3  . ipratropium (ATROVENT HFA) 17 MCG/ACT inhaler Inhale 2 puffs into the lungs 2 (two) times daily as needed (shortness of breath). 1 Inhaler 11  . ketoconazole (NIZORAL) 2 %  cream Apply 1 application topically daily. 60 g 0  . levalbuterol (XOPENEX HFA) 45 MCG/ACT inhaler Inhale 2 puffs into the lungs every 6 (six) hours as needed for wheezing or shortness of breath. 1 Inhaler 11  . LORazepam (ATIVAN) 0.5 MG tablet Take 1 tablet (0.5 mg total) by mouth 2 (two) times daily as needed for anxiety. 30 tablet 1  . meclizine (ANTIVERT) 25 MG tablet Take 1 tablet (25 mg total) by mouth 3 (three) times daily as needed for dizziness. 90 tablet 3  . metoprolol succinate (TOPROL-XL) 25 MG 24 hr tablet Take 1 tablet (25 mg total) by mouth daily. 30 tablet 3  . nystatin (MYCOSTATIN) 100000 UNIT/ML suspension     . omeprazole (PRILOSEC) 40 MG capsule Take 1 capsule (40 mg total) by mouth daily. 30 capsule 2  . pravastatin (PRAVACHOL) 20 MG tablet Take 1 tablet (20 mg total) by  mouth daily. 30 tablet 2  . promethazine-codeine (PHENERGAN WITH CODEINE) 6.25-10 MG/5ML syrup Take 5 mLs by mouth every 6 (six) hours as needed for cough. 200 mL 0  . Tiotropium Bromide-Olodaterol (STIOLTO RESPIMAT) 2.5-2.5 MCG/ACT AERS Inhale 2 puffs into the lungs daily. 1 Inhaler 11  . trimethoprim (TRIMPEX) 100 MG tablet   1  . valsartan (DIOVAN) 80 MG tablet Take 1 tablet (80 mg total) by mouth daily. 30 tablet 5  . venlafaxine XR (EFFEXOR XR) 37.5 MG 24 hr capsule Take 1 tablet daily 30 capsule 3  . VIGAMOX 0.5 % ophthalmic solution      No current facility-administered medications on file prior to visit.

## 2014-10-14 ENCOUNTER — Other Ambulatory Visit: Payer: Self-pay | Admitting: *Deleted

## 2014-10-14 ENCOUNTER — Ambulatory Visit (INDEPENDENT_AMBULATORY_CARE_PROVIDER_SITE_OTHER): Payer: Medicare Other | Admitting: Endocrinology

## 2014-10-14 ENCOUNTER — Encounter: Payer: Self-pay | Admitting: Endocrinology

## 2014-10-14 VITALS — BP 128/60 | HR 88 | Temp 98.1°F | Resp 16 | Ht 63.0 in | Wt 200.4 lb

## 2014-10-14 DIAGNOSIS — E785 Hyperlipidemia, unspecified: Secondary | ICD-10-CM | POA: Diagnosis not present

## 2014-10-14 DIAGNOSIS — N3000 Acute cystitis without hematuria: Secondary | ICD-10-CM | POA: Diagnosis not present

## 2014-10-14 DIAGNOSIS — F411 Generalized anxiety disorder: Secondary | ICD-10-CM | POA: Diagnosis not present

## 2014-10-14 DIAGNOSIS — E1165 Type 2 diabetes mellitus with hyperglycemia: Secondary | ICD-10-CM | POA: Diagnosis not present

## 2014-10-14 DIAGNOSIS — IMO0002 Reserved for concepts with insufficient information to code with codable children: Secondary | ICD-10-CM

## 2014-10-14 LAB — URINALYSIS, ROUTINE W REFLEX MICROSCOPIC
BILIRUBIN URINE: NEGATIVE
Ketones, ur: NEGATIVE
Nitrite: POSITIVE — AB
SPECIFIC GRAVITY, URINE: 1.02 (ref 1.000–1.030)
Total Protein, Urine: NEGATIVE
URINE GLUCOSE: 100 — AB
Urobilinogen, UA: 0.2 (ref 0.0–1.0)
pH: 5.5 (ref 5.0–8.0)

## 2014-10-14 LAB — LIPID PANEL
CHOLESTEROL: 154 mg/dL (ref 0–200)
HDL: 42.9 mg/dL (ref >39.00)
NonHDL: 111.1
Total CHOL/HDL Ratio: 4
Triglycerides: 233 mg/dL — ABNORMAL HIGH (ref 0.0–149.0)
VLDL: 46.6 mg/dL — ABNORMAL HIGH (ref 0.0–40.0)

## 2014-10-14 LAB — BASIC METABOLIC PANEL
BUN: 13 mg/dL (ref 6–23)
CHLORIDE: 98 meq/L (ref 96–112)
CO2: 31 mEq/L (ref 19–32)
Calcium: 9.6 mg/dL (ref 8.4–10.5)
Creatinine, Ser: 0.65 mg/dL (ref 0.40–1.20)
GFR: 93.86 mL/min (ref >60.00)
GLUCOSE: 176 mg/dL — AB (ref 70–99)
POTASSIUM: 4.4 meq/L (ref 3.5–5.1)
SODIUM: 134 meq/L — AB (ref 135–145)

## 2014-10-14 LAB — MICROALBUMIN / CREATININE URINE RATIO
CREATININE, U: 66.7 mg/dL
Microalb Creat Ratio: 10.4 mg/g (ref 0.0–30.0)
Microalb, Ur: 6.9 mg/dL — ABNORMAL HIGH (ref 0.0–1.9)

## 2014-10-14 LAB — LDL CHOLESTEROL, DIRECT: Direct LDL: 89 mg/dL

## 2014-10-14 LAB — HEMOGLOBIN A1C: Hgb A1c MFr Bld: 9.9 % — ABNORMAL HIGH (ref 4.6–6.5)

## 2014-10-14 MED ORDER — VALSARTAN 80 MG PO TABS: 80.0000 mg | ORAL_TABLET | Freq: Every day | ORAL | Status: DC

## 2014-10-14 MED ORDER — CANAGLIFLOZIN 100 MG PO TABS: 100.0000 mg | ORAL_TABLET | Freq: Every day | ORAL | Status: DC

## 2014-10-14 MED ORDER — PRAVASTATIN SODIUM 20 MG PO TABS
20.0000 mg | ORAL_TABLET | Freq: Every day | ORAL | Status: DC
Start: 2014-10-14 — End: 2015-05-19

## 2014-10-14 MED ORDER — METOPROLOL SUCCINATE ER 25 MG PO TB24: 25.0000 mg | ORAL_TABLET | Freq: Every day | ORAL | Status: DC

## 2014-10-14 NOTE — Progress Notes (Signed)
Patient ID: [name], female   DOB: Jun 03, 1935, 79 y.o.   MRN: [id number]   Reason for Appointment: follow-up of multiple problems  History of Present Illness    DIABETES type II since 1980   She has been on insulin using Lantus and Humalog for several years and usually A1c around 8% despite fairly good compliance with diet, exercise and glucose monitoring. She also had been on Actos previously which probably benefited her especially with her fatty liver but she stopped it because of fear of side effects      Recent history:  Her A1c has been consistently around 9% but was 10% in March She is taking relatively larger doses of Lantus in the morning over the last few weeks even though previously was on around 40 units This is despite her being given a trial of metformin which she is not taking now Also needing relatively larger doses of mealtime insulin but she does not go up on the dose consistently since she thinks she is eating small portions She thinks her blood sugars may have been higher since he started a new steroid inhaler  She does have the following patterns:  Overnight/early morning readings have been  variable but mostly high during the night when she is getting up to check them and somewhat better fasting  Blood sugar generally go up around lunchtime  Glucose readings are again mostly high before supper although variable  Blood sugars are most consistently high after supper and averaging well over 200  However her glucose readings have been somewhat better in the last 2-3 days below 200 blood sugars tend to go higher midmorning usually  On an average blood sugars are about the same as on the last visit  No hypoglycemia  Insulin regimen: Lantus twice a day, 64--32 with syringe and Novolog 8-10 acb, acl; acs 10-12 Oral hypoglycemic drugs: none       Side effects from medications: None         Proper timing of medications in relation to meals: Yes.          Monitors blood glucose:  5 times a day   Glucometer:  Accu-Chek         Blood Glucose averages recently from meter download recently :  Mean values apply above for all meters except median for One Touch  PRE-MEAL Fasting Lunch Dinner Bedtime Overall  Glucose range: 138-170 125-278  122-248   181-352    Mean/median:  159   208   211  236   196    Hypoglycemia:  none         Meals: 3 meals per day.  Smaller portions; lunch 1-2 pm Supper 6 to 7 PM     Physical activity: exercise: Unable to do any because of dyspnea and fatigue           Dietician visit: Most recent: Several years ago           Complications: are: Peripheral neuropathy with sensory loss     Wt Readings from Last 3 Encounters:  10/14/14 200 lb 6.4 oz (90.901 kg)  07/17/14 200 lb 6.4 oz (90.901 kg)  07/08/14 202 lb (91.627 kg)    Lab Results  Component Value Date   HGBA1C 9.9* 10/14/2014   HGBA1C 10.0* 07/17/2014   HGBA1C 9.3* 03/18/2014   Lab Results  Component Value Date   MICROALBUR 6.9* 10/14/2014   LDLCALC 124* 12/16/2013   CREATININE 0.65 10/14/2014  Medication List       This list is accurate as of: 10/14/14 11:59 PM.  Always use your most recent med list.               acetaminophen 500 MG tablet  Commonly known as:  TYLENOL  Take 500 mg by mouth every 6 (six) hours as needed for pain.     albuterol (2.5 MG/3ML) 0.083% nebulizer solution  Commonly known as:  PROVENTIL  Take 3 mLs (2.5 mg total) by nebulization every 6 (six) hours as needed for wheezing or shortness of breath.     ALPRAZolam 0.5 MG tablet  Commonly known as:  XANAX  Take 1/2 tablet to 1 tablet two times a day     aspirin 325 MG tablet  Take 325 mg by mouth daily.     B-D INS SYRINGE 0.5CC/31GX5/16 31G X 5/16" 0.5 ML Misc  Generic drug:  Insulin Syringe-Needle U-100     BD INSULIN SYRINGE ULTRAFINE 31G X 15/64" 0.5 ML Misc  Generic drug:  Insulin Syringe-Needle U-100     bacitracin ophthalmic ointment      canagliflozin 100 MG Tabs tablet  Commonly known as:  INVOKANA  Take 1 tablet (100 mg total) by mouth daily.     cholestyramine 4 G packet  Commonly known as:  QUESTRAN  Take one package up to 2times a day as needed for diarrhea.     dicyclomine 10 MG capsule  Commonly known as:  BENTYL  Take 1 capsule by mouth once daily     diphenoxylate-atropine 2.5-0.025 MG per tablet  Commonly known as:  LOMOTIL  Take 1 tablet by mouth 2 (two) times daily as needed for diarrhea or loose stools.     fenofibrate 145 MG tablet  Commonly known as:  TRICOR  Take 1 tablet (145 mg total) by mouth daily.     fluconazole 150 MG tablet  Commonly known as:  DIFLUCAN  1 tablet daily for 4 days for yeast infection     glucose blood test strip  Commonly known as:  ACCU-CHEK AVIVA PLUS  Use as instructed to check blood sugars 8 times per day dx code 250.02     HYDROcodone-acetaminophen 5-325 MG per tablet  Commonly known as:  NORCO/VICODIN  Take 1 tablet by mouth 3 (three) times daily as needed for moderate pain.     INCRUSE ELLIPTA 62.5 MCG/INH Aepb  Generic drug:  Umeclidinium Bromide     insulin aspart 100 UNIT/ML FlexPen  Commonly known as:  NOVOLOG FLEXPEN  Inject 15 units three times a day     insulin glargine 100 UNIT/ML injection  Commonly known as:  LANTUS  Take 45 units in the morning and 30 units at bedtime     ipratropium 17 MCG/ACT inhaler  Commonly known as:  ATROVENT HFA  Inhale 2 puffs into the lungs 2 (two) times daily as needed (shortness of breath).     ketoconazole 2 % cream  Commonly known as:  NIZORAL  Apply 1 application topically daily.     levalbuterol 45 MCG/ACT inhaler  Commonly known as:  XOPENEX HFA  Inhale 2 puffs into the lungs every 6 (six) hours as needed for wheezing or shortness of breath.     LORazepam 0.5 MG tablet  Commonly known as:  ATIVAN  Take 1 tablet (0.5 mg total) by mouth 2 (two) times daily as needed for anxiety.     magic mouthwash Soln    Swish with 2  teaspoonfuls by mouth as needed     meclizine 25 MG tablet  Commonly known as:  ANTIVERT  Take 1 tablet (25 mg total) by mouth 3 (three) times daily as needed for dizziness.     metoprolol succinate 25 MG 24 hr tablet  Commonly known as:  TOPROL-XL  Take 1 tablet (25 mg total) by mouth daily.     nystatin 100000 UNIT/ML suspension  Commonly known as:  MYCOSTATIN     omeprazole 40 MG capsule  Commonly known as:  PRILOSEC  Take 1 capsule (40 mg total) by mouth daily.     pravastatin 20 MG tablet  Commonly known as:  PRAVACHOL  Take 1 tablet (20 mg total) by mouth daily.     promethazine-codeine 6.25-10 MG/5ML syrup  Commonly known as:  PHENERGAN with CODEINE  Take 5 mLs by mouth every 6 (six) hours as needed for cough.     Tiotropium Bromide-Olodaterol 2.5-2.5 MCG/ACT Aers  Commonly known as:  STIOLTO RESPIMAT  Inhale 2 puffs into the lungs daily.     trimethoprim 100 MG tablet  Commonly known as:  TRIMPEX     valsartan 80 MG tablet  Commonly known as:  DIOVAN  Take 1 tablet (80 mg total) by mouth daily.     venlafaxine XR 37.5 MG 24 hr capsule  Commonly known as:  EFFEXOR XR  Take 1 tablet daily     VIGAMOX 0.5 % ophthalmic solution  Generic drug:  moxifloxacin        Allergies:  Allergies  Allergen Reactions  . Chlordiazepoxide-Clidinium Other (See Comments)    Sleepy,weak  . Biaxin [Clarithromycin]     Foul taste, abd pain, diarrhea  . Tramadol   . Codeine     REACTION: gi upset- only in high doses per pt.  Can tolerate cough syrup with codeine.     Past Medical History  Diagnosis Date  . Irritable bowel syndrome   . Other chronic nonalcoholic liver disease   . Esophageal reflux   . Allergic rhinitis, cause unspecified     Sinus CT Rec 12-23-2009  . 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 Sciatica   . Shingles 2010  . Steatohepatitis   . Diabetes mellitus   . Diverticulosis    . Hiatal hernia   . Esophageal stricture   . Diarrhea   . GERD (gastroesophageal reflux disease)   . Shortness of breath   . Hypertension   . Arthritis   . Glaucoma   . Complication of anesthesia     takes along time to wake up  . Oxygen deficiency   . Benign paroxysmal positional vertigo 08/09/2012  . Neuropathy in diabetes 08/09/2012    Past Surgical History  Procedure Laterality Date  . Cholecystectomy open  1978  . Liver biopsy  08-1992  . Parathyroid exploration    . Tonsillectomy and adenoidectomy    . Total abdominal hysterectomy    . Esophagogastroduodenoscopy  9509,[id number]    H Hernia,es.stricture s/p dil 27F  . Colonoscopy  07-2001    mild diverticulosis  . Carpal tunnel release      right hand  . Cataract extraction      Bilateral  . Ulnar nerve transposition  12/07/2011    Procedure: ULNAR NERVE DECOMPRESSION/TRANSPOSITION;this was cancelled-not done  Surgeon: Cammie Sickle., MD;  Location: [facility];  Service: Orthopedics;  Laterality: Right;  right ulnar nerve in situ  decompression  . Ulnar tunnel release  03/07/2012    Procedure: CUBITAL TUNNEL RELEASE;  Surgeon: Roseanne Kaufman, MD;  Location: [facility];  Service: Orthopedics;  Laterality: Right;  ulnar nerve release at the elbow      Family History  Problem Relation Age of Onset  . Lung cancer Mother     small cell;Byssinosis  . Heart disease Father   . Lung cancer Sister   . Liver cancer Sister     ? mets from another area of the body  . Diabetes      grandmother  . Stroke Maternal Grandfather     Social History:  reports that she quit smoking about 26 years ago. Her smoking use included Cigarettes. She has never used smokeless tobacco. She reports that she does not drink alcohol or use illicit drugs.  Review of Systems:  HYPERTENSION:   blood pressure is Usually well-controlled with 80 mg Diovan.  She thinks sometimes blood pressure is about 017 systolic at home     HYPERLIPIDEMIA: The lipid abnormality consists of elevated LDL treated with pravastatin 20 mg only which she has no side effects from     Since her triglycerides were relatively higher she had also been given fenofibrate but does not think she is taking this   Lab Results  Component Value Date   CHOL 154 10/14/2014   HDL 42.90 10/14/2014   LDLCALC 124* 12/16/2013   LDLDIRECT 89.0 10/14/2014   TRIG 233.0* 10/14/2014   CHOLHDL 4 10/14/2014    She is followed by pulmonologist for chronic COPD, taking home oxygen and symptoms are cough, dyspnea and recurrent bronchitis, slightly better with new inhaler  Dizziness- during her hospitalization  possible BPPV was diagnosed.   Vestibular PT was done which was helpful but she still has episodes of swimmy headedness at times  She was given Effexor XR to prevent episodes of panic attacks when she has sudden onset of feeling weak and dizzy but she was a failure of side effects and did not start it She takes Xanax at night mostly and lorazepam in the daytime for anxiety    Continues to complain about lack of energy.  No etiology found except for her COPD  Lab Results  Component Value Date   TSH 2.14 07/17/2014    Diarrhea: She has had long-standing intermittent diarrhea possibly IBS. Is taking Questran as needed, may take this daily. Also does not take it twice a day because of fear of constipation.  Recently has been given a different preparation by pharmacist and it causes nausea  Occasionally takes Lomotil.    Foot exam done in 8/15: Foot exam shows sensory loss distally, normal pulses and no skin lesions   Examination:   BP 128/60 mmHg  Pulse 88  Temp(Src) 98.1 F (36.7 C)  Resp 16  Ht 5' 3"  (1.6 m)  Wt 200 lb 6.4 oz (90.901 kg)  BMI 35.51 kg/m2  SpO2 93%  Body mass index is 35.51 kg/(m^2).     no ankle edema   ASSESSMENT/ PLAN:   Diabetes type 2   The patient's diabetes control is  overall poor and requiring very  large doses of insulin again  Since she had previously taking metformin without relief and she does not want to take Actos because of weight gain Discussed action of SGLT 2 drugs on lowering glucose by decreasing kidney absorption of glucose, benefits of weight loss and lower blood pressure, possible side effects including candidiasis and  dosage regimen  She is a good candidate for starting Invokana and discussed that this should improve control and lower her insulin requirement as well as benefit her weight    Also discussed increasing her Humalog insulin because she is tending to have higher readings after meals especially after supper  HYPERTENSION: Well-controlled as blood pressure has been usually fairly good  She will need to watch this carefully as BP may improve with starting Diovan which may need to be stopped  Hyperlipidemia: She will need to have lipids to be rechecked with higher dose of pravastatin   Anxiety: Again may consider Effexor XR if she has any panic attacks  Vertigo/dizziness episodes: This is less frequent.    Counseling time over 50% of today's 25 minute visit on above topics   Patient Instructions  Take at least 10-12 Humalog at lunch  Invokana 100 mg in am before bfst; may need to reduce all insulin doses when sugars come down   Watch for BP coming down    Check all meds with list below     Addendum: A1c 9.9 % and LDL improved at 89.  Has UTI, will have her take Cipro again  [facility] 10/15/2014, 7:45 AM   Office Visit on 10/14/2014  Component Date Value Ref Range Status  . Microalb, Ur 10/14/2014 6.9* 0.0 - 1.9 mg/dL Final  . Creatinine,U 10/14/2014 66.7   Final  . Microalb Creat Ratio 10/14/2014 10.4  0.0 - 30.0 mg/g Final  . Color, Urine 10/14/2014 YELLOW  Yellow;Lt. Yellow Final  . APPearance 10/14/2014 Cloudy* Clear Final  . Specific Gravity, Urine 10/14/2014 1.020  1.000-1.030 Final  . pH 10/14/2014 5.5  5.0 - 8.0 Final  . Total Protein,  Urine 10/14/2014 NEGATIVE  Negative Final  . Urine Glucose 10/14/2014 100* Negative Final  . Ketones, ur 10/14/2014 NEGATIVE  Negative Final  . Bilirubin Urine 10/14/2014 NEGATIVE  Negative Final  . Hgb urine dipstick 10/14/2014 TRACE-INTACT* Negative Final  . Urobilinogen, UA 10/14/2014 0.2  0.0 - 1.0 Final  . Leukocytes, UA 10/14/2014 LARGE* Negative Final  . Nitrite 10/14/2014 POSITIVE* Negative Final  . WBC, UA 10/14/2014 TNTC(>50/hpf)* 0-2/hpf Final  . RBC / HPF 10/14/2014 3-6/hpf* 0-2/hpf Final  . Squamous Epithelial / LPF 10/14/2014 Rare(0-4/hpf)  Rare(0-4/hpf) Final  . Bacteria, UA 10/14/2014 Many(>50/hpf)* None Final  . Hgb A1c MFr Bld 10/14/2014 9.9* 4.6 - 6.5 % Final   Glycemic Control Guidelines for People with Diabetes:Non Diabetic:  <6%Goal of Therapy: <7%Additional Action Suggested:  >8%   . Sodium 10/14/2014 134* 135 - 145 mEq/L Final  . Potassium 10/14/2014 4.4  3.5 - 5.1 mEq/L Final  . Chloride 10/14/2014 98  96 - 112 mEq/L Final  . CO2 10/14/2014 31  19 - 32 mEq/L Final  . Glucose, Bld 10/14/2014 176* 70 - 99 mg/dL Final  . BUN 10/14/2014 13  6 - 23 mg/dL Final  . Creatinine, Ser 10/14/2014 0.65  0.40 - 1.20 mg/dL Final  . Calcium 10/14/2014 9.6  8.4 - 10.5 mg/dL Final  . GFR 10/14/2014 93.86  >60.00 mL/min Final  . Cholesterol 10/14/2014 154  0 - 200 mg/dL Final   ATP III Classification       Desirable:  < 200 mg/dL               Borderline High:  200 - 239 mg/dL          High:  > = 240 mg/dL  . Triglycerides 10/14/2014 233.0* 0.0 - 149.0  mg/dL Final   Normal:  <150 mg/dLBorderline High:  150 - 199 mg/dL  . HDL 10/14/2014 42.90  >39.00 mg/dL Final  . VLDL 10/14/2014 46.6* 0.0 - 40.0 mg/dL Final  . Total CHOL/HDL Ratio 10/14/2014 4   Final                  Men          Women1/2 Average Risk     3.4          3.3Average Risk          5.0          4.42X Average Risk          9.6          7.13X Average Risk          15.0          11.0                      . NonHDL  10/14/2014 111.10   Final   NOTE:  Non-HDL goal should be 30 mg/dL higher than patient's LDL goal (i.e. LDL goal of < 70 mg/dL, would have non-HDL goal of < 100 mg/dL)  . Direct LDL 10/14/2014 89.0   Final   Optimal:  <100 mg/dLNear or Above Optimal:  100-129 mg/dLBorderline High:  130-159 mg/dLHigh:  160-189 mg/dLVery High:  >190 mg/dL

## 2014-10-14 NOTE — Patient Instructions (Addendum)
Take at least 10-12 Humalog at lunch  Invokana 100 mg in am before bfst; may need to reduce all insulin doses when sugars come down   Watch for BP coming down    Check all meds with list below

## 2014-10-15 ENCOUNTER — Telehealth: Payer: Self-pay | Admitting: Endocrinology

## 2014-10-15 ENCOUNTER — Telehealth: Payer: Self-pay | Admitting: *Deleted

## 2014-10-15 MED ORDER — CIPROFLOXACIN HCL 500 MG PO TABS: 500.0000 mg | ORAL_TABLET | Freq: Two times a day (BID) | ORAL | Status: DC

## 2014-10-15 NOTE — Progress Notes (Signed)
Quick Note:  Please let patient know A1c 9.9 % and bad cholesterol improved at 89. Has UTI, will have her take Cipro again, prescription sent   ______

## 2014-10-15 NOTE — Telephone Encounter (Signed)
Patient called wanting to know about her urine results, this morning she said she was passing blood.  She also states that she does not want to take the [place].

## 2014-10-15 NOTE — Telephone Encounter (Signed)
PT needs a call back

## 2014-10-16 ENCOUNTER — Ambulatory Visit: Payer: Medicare Other | Admitting: Endocrinology

## 2014-11-03 ENCOUNTER — Telehealth: Payer: Self-pay | Admitting: Endocrinology

## 2014-11-03 NOTE — Telephone Encounter (Signed)
Increase Lantus doses by 4 units twice a day and all the Humalog doses by 2 units

## 2014-11-03 NOTE — Telephone Encounter (Signed)
Patient called about her blood sugar, it has been running high.  7:30 today it was 232 10 am today it was 261 06/27- 4:51 am 237 7:50 pm-204 9:28 pm 276 Patient is taking STIOLTO RESPIMAT and isn't sure if that could be making it go up? Also she states that her bp is yesterday it was 155/60 and she feels very shaky and trembling. Please advise.

## 2014-11-03 NOTE — Telephone Encounter (Signed)
Noted, patient is aware.

## 2014-11-03 NOTE — Telephone Encounter (Signed)
Pt called requesting a call back in reference to her bloods sugars

## 2014-11-05 ENCOUNTER — Telehealth: Payer: Self-pay | Admitting: Endocrinology

## 2014-11-05 NOTE — Telephone Encounter (Signed)
Sugar was high this AM and is just continuing to drop. Dr. Dwyane Dee changed her med and she started last night with the change.

## 2014-11-10 ENCOUNTER — Telehealth: Payer: Self-pay | Admitting: Internal Medicine

## 2014-11-10 MED ORDER — PROMETHAZINE-CODEINE 6.25-10 MG/5ML PO SYRP: 5.0000 mL | ORAL_SOLUTION | Freq: Four times a day (QID) | ORAL | Status: DC | PRN

## 2014-11-10 MED ORDER — AZITHROMYCIN 250 MG PO TABS: ORAL_TABLET | ORAL | Status: AC

## 2014-11-10 NOTE — Telephone Encounter (Signed)
Called and spoke to pt. Pt c/o increased SOB, deep prod cough with green mucus, chest tightness, increase use of albuterol hfa, weakness x 4 days. Pt denies f/c/s. Pt stated she is taking phenergan with codeine and near the end of her bottle.   Dr Annamaria Boots please advise. Thanks.   Allergies  Allergen Reactions  . Chlordiazepoxide-Clidinium Other (See Comments)    Sleepy,weak  . Biaxin [Clarithromycin]     Foul taste, abd pain, diarrhea  . Tramadol   . Codeine     REACTION: gi upset- only in high doses per pt.  Can tolerate cough syrup with codeine.     Current Outpatient Prescriptions on File Prior to Visit  Medication Sig Dispense Refill  . acetaminophen (TYLENOL) 500 MG tablet Take 500 mg by mouth every 6 (six) hours as needed for pain.     Marland Kitchen albuterol (PROVENTIL) (2.5 MG/3ML) 0.083% nebulizer solution Take 3 mLs (2.5 mg total) by nebulization every 6 (six) hours as needed for wheezing or shortness of breath. 25 vial prn  . ALPRAZolam (XANAX) 0.5 MG tablet Take 1/2 tablet to 1 tablet two times a day 60 tablet 5  . Alum & Mag Hydroxide-Simeth (MAGIC MOUTHWASH) SOLN Swish with 2 teaspoonfuls by mouth as needed 240 mL 1  . aspirin 325 MG tablet Take 325 mg by mouth daily.      . B-D INS SYRINGE 0.5CC/31GX5/16 31G X 5/16" 0.5 ML MISC     . bacitracin ophthalmic ointment   0  . BD INSULIN SYRINGE ULTRAFINE 31G X 15/64" 0.5 ML MISC   1  . canagliflozin (INVOKANA) 100 MG TABS tablet Take 1 tablet (100 mg total) by mouth daily. 30 tablet 3  . cholestyramine (QUESTRAN) 4 G packet Take one package up to 2times a day as needed for diarrhea. 60 each 1  . ciprofloxacin (CIPRO) 500 MG tablet Take 1 tablet (500 mg total) by mouth 2 (two) times daily. 10 tablet 0  . dicyclomine (BENTYL) 10 MG capsule Take 1 capsule by mouth once daily 30 capsule 5  . diphenoxylate-atropine (LOMOTIL) 2.5-0.025 MG per tablet Take 1 tablet by mouth 2 (two) times daily as needed for diarrhea or loose stools. 10 tablet 0  .  fenofibrate (TRICOR) 145 MG tablet Take 1 tablet (145 mg total) by mouth daily. 30 tablet 5  . fluconazole (DIFLUCAN) 150 MG tablet 1 tablet daily for 4 days for yeast infection 4 tablet 1  . glucose blood (ACCU-CHEK AVIVA PLUS) test strip Use as instructed to check blood sugars 8 times per day dx code 250.02 250 each 3  . HYDROcodone-acetaminophen (NORCO/VICODIN) 5-325 MG per tablet Take 1 tablet by mouth 3 (three) times daily as needed for moderate pain. 90 tablet 0  . INCRUSE ELLIPTA 62.5 MCG/INH AEPB   0  . insulin aspart (NOVOLOG FLEXPEN) 100 UNIT/ML FlexPen Inject 15 units three times a day 15 mL 3  . insulin glargine (LANTUS) 100 UNIT/ML injection Take 45 units in the morning and 30 units at bedtime 30 mL 3  . ipratropium (ATROVENT HFA) 17 MCG/ACT inhaler Inhale 2 puffs into the lungs 2 (two) times daily as needed (shortness of breath). 1 Inhaler 11  . ketoconazole (NIZORAL) 2 % cream Apply 1 application topically daily. 60 g 0  . levalbuterol (XOPENEX HFA) 45 MCG/ACT inhaler Inhale 2 puffs into the lungs every 6 (six) hours as needed for wheezing or shortness of breath. 1 Inhaler 11  . LORazepam (ATIVAN) 0.5 MG tablet Take  1 tablet (0.5 mg total) by mouth 2 (two) times daily as needed for anxiety. 30 tablet 1  . meclizine (ANTIVERT) 25 MG tablet Take 1 tablet (25 mg total) by mouth 3 (three) times daily as needed for dizziness. 90 tablet 3  . metoprolol succinate (TOPROL-XL) 25 MG 24 hr tablet Take 1 tablet (25 mg total) by mouth daily. 30 tablet 5  . nystatin (MYCOSTATIN) 100000 UNIT/ML suspension     . omeprazole (PRILOSEC) 40 MG capsule Take 1 capsule (40 mg total) by mouth daily. 30 capsule 2  . pravastatin (PRAVACHOL) 20 MG tablet Take 1 tablet (20 mg total) by mouth daily. 30 tablet 5  . promethazine-codeine (PHENERGAN WITH CODEINE) 6.25-10 MG/5ML syrup Take 5 mLs by mouth every 6 (six) hours as needed for cough. 200 mL 0  . Tiotropium Bromide-Olodaterol (STIOLTO RESPIMAT) 2.5-2.5  MCG/ACT AERS Inhale 2 puffs into the lungs daily. 1 Inhaler 11  . trimethoprim (TRIMPEX) 100 MG tablet   1  . valsartan (DIOVAN) 80 MG tablet Take 1 tablet (80 mg total) by mouth daily. 30 tablet 5  . venlafaxine XR (EFFEXOR XR) 37.5 MG 24 hr capsule Take 1 tablet daily (Patient not taking: Reported on 10/14/2014) 30 capsule 3  . VIGAMOX 0.5 % ophthalmic solution      No current facility-administered medications on file prior to visit.

## 2014-11-10 NOTE — Telephone Encounter (Signed)
Pt is aware of CY's recommendations. Rxs have been sent/called in. Nothing further was needed. 

## 2014-11-10 NOTE — Telephone Encounter (Addendum)
Ok to refill prometh codeine cough syrup                    Offer Zpack

## 2014-11-10 NOTE — Telephone Encounter (Signed)
ATC x 3 Busy signal wcb

## 2014-11-12 ENCOUNTER — Other Ambulatory Visit: Payer: Medicare Other

## 2014-11-12 ENCOUNTER — Ambulatory Visit: Payer: Medicare Other | Admitting: Endocrinology

## 2014-11-16 ENCOUNTER — Other Ambulatory Visit (INDEPENDENT_AMBULATORY_CARE_PROVIDER_SITE_OTHER): Payer: Medicare Other

## 2014-11-16 ENCOUNTER — Ambulatory Visit (INDEPENDENT_AMBULATORY_CARE_PROVIDER_SITE_OTHER): Payer: Medicare Other | Admitting: Endocrinology

## 2014-11-16 ENCOUNTER — Other Ambulatory Visit: Payer: Self-pay | Admitting: *Deleted

## 2014-11-16 ENCOUNTER — Encounter: Payer: Self-pay | Admitting: Endocrinology

## 2014-11-16 VITALS — BP 128/68 | HR 97 | Temp 98.0°F | Resp 16 | Ht 63.0 in | Wt 199.4 lb

## 2014-11-16 DIAGNOSIS — M13142 Monoarthritis, not elsewhere classified, left hand: Secondary | ICD-10-CM

## 2014-11-16 DIAGNOSIS — IMO0002 Reserved for concepts with insufficient information to code with codable children: Secondary | ICD-10-CM

## 2014-11-16 DIAGNOSIS — E1142 Type 2 diabetes mellitus with diabetic polyneuropathy: Secondary | ICD-10-CM | POA: Insufficient documentation

## 2014-11-16 DIAGNOSIS — E1165 Type 2 diabetes mellitus with hyperglycemia: Secondary | ICD-10-CM | POA: Diagnosis not present

## 2014-11-16 DIAGNOSIS — E785 Hyperlipidemia, unspecified: Secondary | ICD-10-CM | POA: Diagnosis not present

## 2014-11-16 LAB — URIC ACID: Uric Acid, Serum: 4.1 mg/dL (ref 2.4–7.0)

## 2014-11-16 LAB — BASIC METABOLIC PANEL
BUN: 13 mg/dL (ref 6–23)
CO2: 29 mEq/L (ref 19–32)
Calcium: 9.5 mg/dL (ref 8.4–10.5)
Chloride: 97 mEq/L (ref 96–112)
Creatinine, Ser: 0.69 mg/dL (ref 0.40–1.20)
GFR: 87.59 mL/min (ref >60.00)
GLUCOSE: 266 mg/dL — AB (ref 70–99)
Potassium: 4.4 mEq/L (ref 3.5–5.1)
Sodium: 134 mEq/L — ABNORMAL LOW (ref 135–145)

## 2014-11-16 MED ORDER — CELECOXIB 100 MG PO CAPS: 100.0000 mg | ORAL_CAPSULE | Freq: Two times a day (BID) | ORAL | Status: DC

## 2014-11-16 MED ORDER — LIRAGLUTIDE 18 MG/3ML ~~LOC~~ SOPN
PEN_INJECTOR | SUBCUTANEOUS | Status: DC
Start: 2014-11-16 — End: 2015-02-24

## 2014-11-16 NOTE — Patient Instructions (Signed)
Start VICTOZA injection as shown once daily at the same time of the day.  Dial the dose to 0.6 mg on the pen for the first week.    You may inject in the stomach, thigh or arm. You may experience nausea in the first few days which usually goes away.  You will feel fullness of the stomach with starting the medication and should try to keep the portions at meals small. After 1 week increase the dose to 1.47m daily if no nausea present.   If any questions or concerns are present call the office or the [place] at [phone] Visit [email or web address] more useful information  Increase Novolog to 12 at lunch and 14 at supper  Reduce all Novolog doses 2 units if Victoza brings sugar <150 after meals

## 2014-11-16 NOTE — Progress Notes (Signed)
Patient ID: [name], female   DOB: 10-Nov-1935, 79 y.o.   MRN: [id number]   Reason for Appointment: follow-up of multiple problems  History of Present Illness    DIABETES type II since 1980   She has been on insulin using Lantus and Humalog for several years and usually A1c around 8% despite fairly good compliance with diet, exercise and glucose monitoring. She also had been on Actos previously which probably benefited her especially with her fatty liver but she stopped it because of fear of side effects      Recent history:  Her A1c has been consistently around 9% but was 10% in March She is taking relatively larger doses of Lantus in the morning over the last few weeks even though previously was on around 40 units This is despite her being given a trial of metformin which she is not taking  She was also recommended a trial of Invokana but she did not do this for fear of yeast infections Also needing relatively larger doses of mealtime insulin but she does not go up on the dose consistently since she thinks she is eating small portions  She does have the following patterns:  Overnight/early morning readings have been variable but mostly high during the night and only today had a relatively good reading of 130  Blood sugars appear to be going up late morning  Blood sugars are somewhat better her lunchtime overall  Blood sugars tend to be progressively higher in the afternoon and evening and overall are the highest at bedtime  She does not increase her Humalog enough when the blood sugars are high especially at suppertime  She thinks her blood sugars are higher because of intercurrent infections including lung infections  Still not able to do much exercise  Insulin regimen: Lantus twice a day, 64--24 with syringe and Novolog 10 acb, acl; acs 10-12 Oral hypoglycemic drugs: none       Side effects from medications: None         Proper timing of medications in  relation to meals: Yes.         Monitors blood glucose:  5 times a day   Glucometer:  Accu-Chek         Blood Glucose averages recently from meter download recently :  Mean values apply above for all meters except median for One Touch  PRE-MEAL Fasting Lunch Dinner Bedtime Overall  Glucose range:  130-205   137-248   178-282   173-296    Mean/median:  185   190   210   230   203    Hypoglycemia:  none         Meals: 3 meals per day.  Smaller portions; lunch 1-2 pm Supper 6 to 7 PM     Physical activity: exercise: Unable to do any because of dyspnea and fatigue           Dietician visit: Most recent: Several years ago           Complications: are: Peripheral neuropathy with sensory loss     Wt Readings from Last 3 Encounters:  11/16/14 199 lb 6.4 oz (90.447 kg)  10/14/14 200 lb 6.4 oz (90.901 kg)  07/17/14 200 lb 6.4 oz (90.901 kg)    Lab Results  Component Value Date   HGBA1C 9.9* 10/14/2014   HGBA1C 10.0* 07/17/2014   HGBA1C 9.3* 03/18/2014   Lab Results  Component Value Date   MICROALBUR 6.9* 10/14/2014  [place] 124* 12/16/2013   CREATININE 0.65 10/14/2014    OTHER problems including complaints of joint pains are detailed in review of systems     Medication List       This list is accurate as of: 11/16/14  3:01 PM.  Always use your most recent med list.               acetaminophen 500 MG tablet  Commonly known as:  TYLENOL  Take 500 mg by mouth every 6 (six) hours as needed for pain.     albuterol (2.5 MG/3ML) 0.083% nebulizer solution  Commonly known as:  PROVENTIL  Take 3 mLs (2.5 mg total) by nebulization every 6 (six) hours as needed for wheezing or shortness of breath.     ALPRAZolam 0.5 MG tablet  Commonly known as:  XANAX  Take 1/2 tablet to 1 tablet two times a day     aspirin 325 MG tablet  Take 325 mg by mouth daily.     B-D INS SYRINGE 0.5CC/31GX5/16 31G X 5/16" 0.5 ML Misc  Generic drug:  Insulin Syringe-Needle U-100     BD INSULIN  SYRINGE ULTRAFINE 31G X 15/64" 0.5 ML Misc  Generic drug:  Insulin Syringe-Needle U-100     bacitracin ophthalmic ointment     celecoxib 100 MG capsule  Commonly known as:  CELEBREX  Take 1 capsule (100 mg total) by mouth 2 (two) times daily.     cholestyramine 4 G packet  Commonly known as:  QUESTRAN  Take one package up to 2times a day as needed for diarrhea.     ciprofloxacin 500 MG tablet  Commonly known as:  CIPRO  Take 1 tablet (500 mg total) by mouth 2 (two) times daily.     dicyclomine 10 MG capsule  Commonly known as:  BENTYL  Take 1 capsule by mouth once daily     diphenoxylate-atropine 2.5-0.025 MG per tablet  Commonly known as:  LOMOTIL  Take 1 tablet by mouth 2 (two) times daily as needed for diarrhea or loose stools.     fenofibrate 145 MG tablet  Commonly known as:  TRICOR  Take 1 tablet (145 mg total) by mouth daily.     fluconazole 150 MG tablet  Commonly known as:  DIFLUCAN  1 tablet daily for 4 days for yeast infection     glucose blood test strip  Commonly known as:  ACCU-CHEK AVIVA PLUS  Use as instructed to check blood sugars 8 times per day dx code 250.02     HYDROcodone-acetaminophen 5-325 MG per tablet  Commonly known as:  NORCO/VICODIN  Take 1 tablet by mouth 3 (three) times daily as needed for moderate pain.     INCRUSE ELLIPTA 62.5 MCG/INH Aepb  Generic drug:  Umeclidinium Bromide     insulin aspart 100 UNIT/ML FlexPen  Commonly known as:  NOVOLOG FLEXPEN  Inject 15 units three times a day     insulin glargine 100 UNIT/ML injection  Commonly known as:  LANTUS  Take 45 units in the morning and 30 units at bedtime     ipratropium 17 MCG/ACT inhaler  Commonly known as:  ATROVENT HFA  Inhale 2 puffs into the lungs 2 (two) times daily as needed (shortness of breath).     ketoconazole 2 % cream  Commonly known as:  NIZORAL  Apply 1 application topically daily.     levalbuterol 45 MCG/ACT inhaler  Commonly known as:  XOPENEX HFA    Inhale 2  puffs into the lungs every 6 (six) hours as needed for wheezing or shortness of breath.     Liraglutide 18 MG/3ML Sopn  Commonly known as:  VICTOZA  Inject 1.2 mg daily     LORazepam 0.5 MG tablet  Commonly known as:  ATIVAN  Take 1 tablet (0.5 mg total) by mouth 2 (two) times daily as needed for anxiety.     magic mouthwash Soln  Swish with 2 teaspoonfuls by mouth as needed     meclizine 25 MG tablet  Commonly known as:  ANTIVERT  Take 1 tablet (25 mg total) by mouth 3 (three) times daily as needed for dizziness.     metoprolol succinate 25 MG 24 hr tablet  Commonly known as:  TOPROL-XL  Take 1 tablet (25 mg total) by mouth daily.     nystatin 100000 UNIT/ML suspension  Commonly known as:  MYCOSTATIN     omeprazole 40 MG capsule  Commonly known as:  PRILOSEC  Take 1 capsule (40 mg total) by mouth daily.     pravastatin 20 MG tablet  Commonly known as:  PRAVACHOL  Take 1 tablet (20 mg total) by mouth daily.     promethazine-codeine 6.25-10 MG/5ML syrup  Commonly known as:  PHENERGAN with CODEINE  Take 5 mLs by mouth every 6 (six) hours as needed for cough.     Tiotropium Bromide-Olodaterol 2.5-2.5 MCG/ACT Aers  Commonly known as:  STIOLTO RESPIMAT  Inhale 2 puffs into the lungs daily.     trimethoprim 100 MG tablet  Commonly known as:  TRIMPEX     valsartan 80 MG tablet  Commonly known as:  DIOVAN  Take 1 tablet (80 mg total) by mouth daily.     venlafaxine XR 37.5 MG 24 hr capsule  Commonly known as:  EFFEXOR XR  Take 1 tablet daily     VIGAMOX 0.5 % ophthalmic solution  Generic drug:  moxifloxacin        Allergies:  Allergies  Allergen Reactions  . Chlordiazepoxide-Clidinium Other (See Comments)    Sleepy,weak  . Biaxin [Clarithromycin]     Foul taste, abd pain, diarrhea  . Tramadol   . Codeine     REACTION: gi upset- only in high doses per pt.  Can tolerate cough syrup with codeine.     Past Medical History  Diagnosis Date  .  Irritable bowel syndrome   . Other chronic nonalcoholic liver disease   . Esophageal reflux   . Allergic rhinitis, cause unspecified     Sinus CT Rec 12-23-2009  . 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 Sciatica   . Shingles 2010  . Steatohepatitis   . Diabetes mellitus   . Diverticulosis   . Hiatal hernia   . Esophageal stricture   . Diarrhea   . GERD (gastroesophageal reflux disease)   . Shortness of breath   . Hypertension   . Arthritis   . Glaucoma   . Complication of anesthesia     takes along time to wake up  . Oxygen deficiency   . Benign paroxysmal positional vertigo 08/09/2012  . Neuropathy in diabetes 08/09/2012    Past Surgical History  Procedure Laterality Date  . Cholecystectomy open  1978  . Liver biopsy  08-1992  . Parathyroid exploration    . Tonsillectomy and adenoidectomy    . Total abdominal hysterectomy    . Esophagogastroduodenoscopy  7026,37-85    H Hernia,es.stricture  s/p dil 60F  . Colonoscopy  07-2001    mild diverticulosis  . Carpal tunnel release      right hand  . Cataract extraction      Bilateral  . Ulnar nerve transposition  12/07/2011    Procedure: ULNAR NERVE DECOMPRESSION/TRANSPOSITION;this was cancelled-not done  Surgeon: Cammie Sickle., MD;  Location: [facility];  Service: Orthopedics;  Laterality: Right;  right ulnar nerve in situ decompression  . Ulnar tunnel release  03/07/2012    Procedure: CUBITAL TUNNEL RELEASE;  Surgeon: Roseanne Kaufman, MD;  Location: [facility];  Service: Orthopedics;  Laterality: Right;  ulnar nerve release at the elbow      Family History  Problem Relation Age of Onset  . Lung cancer Mother     small cell;Byssinosis  . Heart disease Father   . Lung cancer Sister   . Liver cancer Sister     ? mets from another area of the body  . Diabetes      grandmother  . Stroke Maternal Grandfather     Social History:   reports that she quit smoking about 26 years ago. Her smoking use included Cigarettes. She has never used smokeless tobacco. She reports that she does not drink alcohol or use illicit drugs.  Review of Systems:  JOINT pain: She is complaining about intermittent joint pain in her fingers mostly in the middle interphalangeal joints, she thinks they tend to get swollen.  Has had more significant pain last week in her left second interphalangeal joint and she thinks looked somewhat inflamed.  She is asking about testing for arthritis but has not had a recent uric acid level Also has had difficulties tolerating OTC anti-inflammatory medications except rare aspirin  HYPERTENSION:   blood pressure is  well-controlled with 80 mg Diovan.   Occasionally checks blood pressure at home but generally gets variable readings  HYPERLIPIDEMIA: The lipid abnormality consists of elevated LDL treated with pravastatin 20 mg only which she has no side effects from    Her last LDL is improved below 100  Since her triglycerides were relatively higher she had also been given fenofibrate but does not know if she is taking this   Lab Results  Component Value Date   CHOL 154 10/14/2014   HDL 42.90 10/14/2014   LDLCALC 124* 12/16/2013   LDLDIRECT 89.0 10/14/2014   TRIG 233.0* 10/14/2014   CHOLHDL 4 10/14/2014    She is followed by pulmonologist for chronic COPD, taking home oxygen and symptoms are cough, dyspnea and recurrent bronchitis, slightly better with new inhaler  Dizziness- during her hospitalization  possible BPPV was diagnosed.   Vestibular PT was done which was helpful but she still has episodes of swimmy headedness at times  She takes Xanax at night mostly and lorazepam in the daytime for anxiety   Diarrhea: She has had long-standing intermittent diarrhea possibly IBS. Is taking Questran as needed, may take this daily. Also does not take it twice a day because of fear of constipation.  Recently has  been given a different preparation by pharmacist and it causes nausea  Occasionally takes Lomotil.    NEUROPATHY: She has numbness in her feet.  Also complaining of some tingling and numbness in her hands Foot exam done in 8/15: Foot exam shows sensory loss distally, normal pulses and no skin lesions   Examination:   BP 128/68 mmHg  Pulse 97  Temp(Src) 98 F (36.7 C)  Resp 16  Ht 5' 3"  (1.6 m)  Wt 199 lb 6.4 oz (90.447 kg)  BMI 35.33 kg/m2  SpO2 97%  Body mass index is 35.33 kg/(m^2).   She has mild enlargement of the middle interphalangeal joints especially second through fourth the right and second on the left without tenderness.  Mild swelling present in the joints  no ankle edema   ASSESSMENT/ PLAN:   Diabetes type 2   The patient's diabetes control is  overall poor and requiring very large doses of insulin consistently Her last A1c was nearly 10% See history of present illness for detailed discussion of his current management, blood sugar patterns and problems identified  She has progressively high readings in the afternoons and evenings despite taking large doses of morning Lantus She is a good candidate for starting Victoza  and discussed that this should improve control and lower her insulin requirement as well as benefit her weight  Discussed with the patient the nature of GLP-1 drugs, the actions on various organ systems, how they benefit blood glucose control, as well as the benefit of weight loss and  increase satiety . Explained possible side effects especially nausea and vomiting initially; discussed safety information in package insert.  Described the injection technique and dosage titration of Victoza  starting with 0.6 mg once a day at the same time for the first week and then increasing to 1.2 mg if no symptoms of nausea.  Educational brochure on Victoza and co-pay card given   Also discussed increasing her Humalog insulin because she is tending to have higher  readings after meals including after lunch and supper May not need to increase Lantus unless blood sugar stay consistently high with Victoza  ARTHRALGIA with occasional flareup: Will check uric acid and give her a trial of Celebrex for now.  Does not appear to have rheumatoid-like syndrome  Hyperlipidemia: She will continue current management   Counseling time over 50% of today's 25 minute visit on above topics   Patient Instructions  Start VICTOZA injection as shown once daily at the same time of the day.  Dial the dose to 0.6 mg on the pen for the first week.    You may inject in the stomach, thigh or arm. You may experience nausea in the first few days which usually goes away.  You will feel fullness of the stomach with starting the medication and should try to keep the portions at meals small. After 1 week increase the dose to 1.15m daily if no nausea present.   If any questions or concerns are present call the office or the [facility] at [phone] Visit [email or web address] more useful information  Increase Novolog to 12 at lunch and 14 at supper  Reduce all Novolog doses 2 units if Victoza brings sugar <150 after meals        [name] 11/16/2014, 3:01 PM

## 2014-11-17 ENCOUNTER — Telehealth: Payer: Self-pay | Admitting: Endocrinology

## 2014-11-17 ENCOUNTER — Other Ambulatory Visit: Payer: Self-pay | Admitting: *Deleted

## 2014-11-17 MED ORDER — FLUCONAZOLE 150 MG PO TABS: ORAL_TABLET | ORAL | Status: DC

## 2014-11-17 NOTE — Progress Notes (Signed)
Quick Note:  Please let patient know that uric acid is normal and also kidneys  ______

## 2014-11-17 NOTE — Telephone Encounter (Signed)
Noted, patient is aware.

## 2014-11-17 NOTE — Telephone Encounter (Signed)
Patient has a yeast infection under her breast, she wants to know if you have any kind of medication she can use for that. She's also having it in her vaginal area. Please advise.

## 2014-11-17 NOTE — Telephone Encounter (Signed)
She can use topical OTC Lotrimin AF for the skin and she can renew the prescription for Diflucan 150 mg for vaginal infection

## 2014-11-17 NOTE — Telephone Encounter (Signed)
Call pt back please regarding her visit from yesterday

## 2014-11-18 ENCOUNTER — Telehealth: Payer: Self-pay | Admitting: Endocrinology

## 2014-11-18 NOTE — Telephone Encounter (Signed)
Pt callling regarding the blood work results please call her asap Also has questions about aspirin

## 2014-11-26 ENCOUNTER — Other Ambulatory Visit: Payer: Self-pay | Admitting: Internal Medicine

## 2014-11-26 MED ORDER — LEVALBUTEROL TARTRATE 45 MCG/ACT IN AERO: 2.0000 | INHALATION_SPRAY | Freq: Four times a day (QID) | RESPIRATORY_TRACT | Status: DC | PRN

## 2014-12-02 ENCOUNTER — Telehealth: Payer: Self-pay | Admitting: Endocrinology

## 2014-12-02 NOTE — Telephone Encounter (Signed)
Pt needs a call back but this is not emergent and it is about some questions she has

## 2014-12-03 ENCOUNTER — Other Ambulatory Visit: Payer: Self-pay | Admitting: *Deleted

## 2014-12-03 MED ORDER — DIPHENOXYLATE-ATROPINE 2.5-0.025 MG PO TABS: 1.0000 | ORAL_TABLET | Freq: Every day | ORAL | Status: DC

## 2014-12-03 NOTE — Telephone Encounter (Signed)
Noted, patient is aware.

## 2014-12-03 NOTE — Telephone Encounter (Signed)
Has she checked her blood pressure? She should be taking the cholestyramine powder at least once a day for diarrhea with Imodium.  If she is already doing this we can call in Lomotil tablets, 1 daily as needed #5 May need to have her come in for a potassium check for weakness She will need to increase her Humalog 2-6 units whenever sugar is high

## 2014-12-03 NOTE — Telephone Encounter (Signed)
Patient called, she said she's had diarrhea for the last 8 days and feels very weak. She hasn't started on Victoza yet because of the diarrhea she's already having.  She also said after the diarrhea her sugars went down, but are now going back up.  She said the medication you gave her for her hands has caused severe acid reflux. She states that she is still very dizzy.

## 2014-12-07 ENCOUNTER — Telehealth: Payer: Self-pay | Admitting: Endocrinology

## 2014-12-07 NOTE — Telephone Encounter (Signed)
Patient called this morning and would like for Rhonda to please call her directly   Call back: [phone]  Thank you

## 2014-12-11 ENCOUNTER — Other Ambulatory Visit: Payer: Medicare Other

## 2014-12-11 ENCOUNTER — Other Ambulatory Visit (INDEPENDENT_AMBULATORY_CARE_PROVIDER_SITE_OTHER): Payer: Medicare Other

## 2014-12-11 DIAGNOSIS — H35372 Puckering of macula, left eye: Secondary | ICD-10-CM | POA: Diagnosis not present

## 2014-12-11 DIAGNOSIS — E1165 Type 2 diabetes mellitus with hyperglycemia: Secondary | ICD-10-CM

## 2014-12-11 DIAGNOSIS — M13142 Monoarthritis, not elsewhere classified, left hand: Secondary | ICD-10-CM

## 2014-12-11 DIAGNOSIS — IMO0002 Reserved for concepts with insufficient information to code with codable children: Secondary | ICD-10-CM

## 2014-12-11 DIAGNOSIS — H04123 Dry eye syndrome of bilateral lacrimal glands: Secondary | ICD-10-CM | POA: Diagnosis not present

## 2014-12-11 DIAGNOSIS — Z961 Presence of intraocular lens: Secondary | ICD-10-CM | POA: Diagnosis not present

## 2014-12-11 DIAGNOSIS — H26491 Other secondary cataract, right eye: Secondary | ICD-10-CM | POA: Diagnosis not present

## 2014-12-11 DIAGNOSIS — E119 Type 2 diabetes mellitus without complications: Secondary | ICD-10-CM | POA: Diagnosis not present

## 2014-12-11 LAB — COMPREHENSIVE METABOLIC PANEL
ALBUMIN: 4.1 g/dL (ref 3.5–5.2)
ALK PHOS: 48 U/L (ref 39–117)
ALT: 22 U/L (ref 0–35)
AST: 42 U/L — ABNORMAL HIGH (ref 0–37)
BILIRUBIN TOTAL: 0.3 mg/dL (ref 0.2–1.2)
BUN: 12 mg/dL (ref 6–23)
CALCIUM: 9.7 mg/dL (ref 8.4–10.5)
CHLORIDE: 98 meq/L (ref 96–112)
CO2: 32 mEq/L (ref 19–32)
Creatinine, Ser: 0.68 mg/dL (ref 0.40–1.20)
GFR: 89.06 mL/min (ref >60.00)
Glucose, Bld: 238 mg/dL — ABNORMAL HIGH (ref 70–99)
Potassium: 4.4 mEq/L (ref 3.5–5.1)
Sodium: 136 mEq/L (ref 135–145)
TOTAL PROTEIN: 7.4 g/dL (ref 6.0–8.3)

## 2014-12-11 LAB — CBC
HCT: 40.4 % (ref 36.0–46.0)
HEMOGLOBIN: 13.1 g/dL (ref 12.0–15.0)
MCHC: 32.3 g/dL (ref 30.0–36.0)
MCV: 84.2 fl (ref 78.0–100.0)
Platelets: 170 10*3/uL (ref 150.0–400.0)
RBC: 4.8 Mil/uL (ref 3.87–5.11)
RDW: 14.2 % (ref 11.5–15.5)
WBC: 8 10*3/uL (ref 4.0–10.5)

## 2014-12-11 LAB — HEMOGLOBIN A1C: Hgb A1c MFr Bld: 9.9 % — ABNORMAL HIGH (ref 4.6–6.5)

## 2014-12-16 ENCOUNTER — Other Ambulatory Visit: Payer: Self-pay | Admitting: Endocrinology

## 2014-12-16 ENCOUNTER — Telehealth: Payer: Self-pay | Admitting: *Deleted

## 2014-12-16 DIAGNOSIS — R42 Dizziness and giddiness: Secondary | ICD-10-CM

## 2014-12-16 DIAGNOSIS — H9201 Otalgia, right ear: Secondary | ICD-10-CM

## 2014-12-16 NOTE — Telephone Encounter (Signed)
Patient called, [name] wants to know if you can make a referral to Dr. Thornell Mule for her right ear, [name] said [name]'s having pain there and in her throat, [name] does not want to go back to Dr. Benjamine Mola. Please advise

## 2014-12-16 NOTE — Telephone Encounter (Signed)
Done

## 2014-12-17 ENCOUNTER — Telehealth: Payer: Self-pay | Admitting: *Deleted

## 2014-12-17 NOTE — Telephone Encounter (Signed)
Noted patient is aware

## 2014-12-17 NOTE — Telephone Encounter (Signed)
Patient called requesting her lab results

## 2014-12-17 NOTE — Telephone Encounter (Signed)
A1c 9.9, otherwise normal

## 2014-12-23 ENCOUNTER — Ambulatory Visit: Payer: Medicare Other | Admitting: Endocrinology

## 2014-12-29 ENCOUNTER — Other Ambulatory Visit: Payer: Self-pay | Admitting: *Deleted

## 2014-12-29 MED ORDER — INSULIN GLARGINE 100 UNIT/ML ~~LOC~~ SOLN: SUBCUTANEOUS | Status: DC

## 2015-01-07 DIAGNOSIS — H906 Mixed conductive and sensorineural hearing loss, bilateral: Secondary | ICD-10-CM | POA: Diagnosis not present

## 2015-01-07 DIAGNOSIS — E109 Type 1 diabetes mellitus without complications: Secondary | ICD-10-CM | POA: Diagnosis not present

## 2015-01-08 ENCOUNTER — Ambulatory Visit (INDEPENDENT_AMBULATORY_CARE_PROVIDER_SITE_OTHER): Payer: Medicare Other | Admitting: Internal Medicine

## 2015-01-08 ENCOUNTER — Encounter: Payer: Self-pay | Admitting: Internal Medicine

## 2015-01-08 VITALS — BP 130/78 | HR 84 | Ht 63.0 in | Wt 200.8 lb

## 2015-01-08 DIAGNOSIS — J449 Chronic obstructive pulmonary disease, unspecified: Secondary | ICD-10-CM | POA: Diagnosis not present

## 2015-01-08 DIAGNOSIS — J9611 Chronic respiratory failure with hypoxia: Secondary | ICD-10-CM | POA: Diagnosis not present

## 2015-01-08 DIAGNOSIS — Z23 Encounter for immunization: Secondary | ICD-10-CM

## 2015-01-08 NOTE — Patient Instructions (Addendum)
Flu vax  Sample Stiolto  Inhale 2 puffs once daily

## 2015-01-08 NOTE — Progress Notes (Signed)
Patient ID: [name], female    DOB: 1935/12/05, 79 y.o.   MRN: [id number]  HPI SATURATION QUALIFICATIONS: Patient Saturations on Room Air while Ambulating = 84% Patient Saturations on 2.5 Liters of oxygen while Ambulating = 92%[name],CMA  09/28/10-  75 yoF former smoker with COPD, complicated by chronic rhinitis, GERD. Last here 04/29/10- note reviewed. Acute visit.-- Woke with soreness across under her breasts. Losing voice. Coughing spells, with much liquid coming up and from nose. Prior to that hadn't been coughing. C/o weak, dizzy, light headed- relieved by her oxygen. . Moving into a smaller apartment. Denies fever. Easy DOE short walks room to room.   10/31/10- 33 yoF former smoker with COPD, complicated by chronic rhinitis, GERD. She denies major change since last here. Has to stay in to avoid heat and on her oxygen much of the time in the house. Coughs scant clear or lemon yellow, with some hoarseness which comes and goes. She describes hoarseness as worse with phone conversations, variable but rarely if ever clear. She had seen Dr [name] years ago for epistaxis. Easy choke with eating. Discussed aspiration precautions, chin tuck.   02/26/11- 70 yoF former smoker with COPD, complicated by chronic rhinitis, GERD. Has had flu vaccine. No coughing through the summer. We first cool days as she stepped outside she would begin to cough again. Dry hacking cough. Very aware of active reflux anytime she bends over, regurgitating recently swallowed food. Has also had some diarrhea. Complains of spasms intermittently left lower anterior chest and left upper quadrant. It may all be GI. She thinks it is part of her diabetic neuropathy. Has appointment soon with Dr.Brodie.  08/16/11-75 yoF former smoker with COPD, complicated by chronic rhinitis, GERD. During the winter had prolonged diarrhea evaluated by Dr [name] and apparently attributed to her diabetic neuropathy. May have had  esophagitis-was told to take Magic mouthwash. Gradually easier dyspnea on exertion. Now on oxygen 2-3 L most of the time. She came today with acute complaint of aching pain intermittently for the past several weeks, dull. Associated with mid back at strap level. Pain is gone right now. She saw no effect of activity or position and said pain was nonpleuritic or radiating.  CXR- 08/16/11- reviewed w/ her. NAD with no acute process.   08/28/11-75 yoF former smoker with COPD, complicated by chronic rhinitis, GERD,  DM. Back pain comes and goes. She implies that it is better today and says that she really has been present a long time as part of her degenerative back pain. Complains of feeling sluggish and tired. Recent bronchitis with productive green sputum watery nose no fever. Using Atrovent. Intolerant of albuterol and Symbicort because of overstimulation. Home oxygen is 2-1/2-3 L. CXR 08/15/11-  IMPRESSION:  Hyperinflation.  No active cardiopulmonary disease.  Original Report Authenticated By: [name], M.D.   12/28/11- 51 yoF former smoker with COPD, complicated by chronic rhinitis, GERD,  DM. Not doing well-stays on O2 all the time; since being on Spiriva at first did great but has noticed having to stop more and rest during the day(nap) She  stays indoors most of the time without many friends to do things with. She often has self-limited sharp twinges right anterior chest wall and left infrascapular back. With her known degenerative disc disease, we discussed the possibility this was nerve root irritation. Spiriva helps variable cough.  04/23/12- 46 yoF former smoker with COPD, complicated by chronic rhinitis, GERD,  DM. FOLLOWS FOR: still  has good and bad days with SOB and activity levels Watery rhinorrhea. No acute problems. No respiratory problems associated with surgery for nerve impingement in her right hand. She continues oxygen 3 or 3 a half liters/Advanced PFT: 01/16/2012 severe  obstructive airways disease with insignificant response to bronchodilator, air trapping, diffusion moderately reduced. FEV1 0.82/45%, FEV1/FEC 0.46. Emphysema pattern on the loop. TLC 100%, RV 148%, DLCO 47%.  10/22/12- 5 yoF former smoker with COPD, complicated by chronic rhinitis, GERD,  DM FOLLOWS FOR:has to pace herself more; continues to have nervous spell(could be from DM) SOB pretty much all the time Episodic shaky spells for which she says she's had evaluation by neurology, ENT, primary physician and ER. "Electric shock sensation" from her known neuropathy. Stress makes all of this worse. Breathing has not really changed. We talked about the possibility she was having a vasomotor/autonomic neuropathy related to her diabetes  01/21/13- 77 yoF former smoker with COPD, complicated by chronic rhinitis, GERD,  DM FOLLOWS FOR:has to pace herself more; continues to have nervous spell(could be from DM) SOB pretty much all the time Has been treated for a corneal abscess with Cipro, another antibiotic for cough, and for chronic cystitis. Got blistering intertrigo which Dr. Dwyane Dee treated with Flagyl by her report. Little change in her chronic cough with clear mucus. Twinges of bilateral tussive rib pains.  07/21/13-78 yoF former smoker with COPD, complicated by chronic rhinitis, GERD,  DM FOLLOWS FOR: Stays tired all the time; was in [facility] January 2015. Has RN that comes to her home. Pt states she has chest tightness as well. Stays tired. Home visiting nurse has completed. Chest gets tight-nebulizer helps. Watery nose.  09/02/2013 Acute OV  Complains of 2 weeks of cough and congestion . cough still going on-lt. green,occass. wheezing,chest tightness no fcs Was called in augmentin on 4/17, finished last dose yesterday. Mucus is some better but still coughing and wheezing . Cough is keeping her up at night.  Patient denies any this is chest pain, orthopnea, PND, or leg swelling CXR last ov showed  chronic changes with COPD 07/21/13   11/24/13- 78 yoF former smoker with COPD, complicated by chronic rhinitis, GERD,  DM FOLLOWS FOR:  Increased sob, tightness in chest and coughing spells O2 3.5L/ Advanced Needed prednisone/ Augmentin in April Has home health CNA helping now.  Dyspnea w/ exertion, persistent w/o much change. Occ cough, wheeze, watery nose. Occ choke/ light aspiration. Not needing nebulizer currently. CXR 07/21/13 IMPRESSION:  1. There is mild hyperinflation consistent with COPD. There is no  evidence of pneumonia.  2. There is no evidence of CHF. There is no pleural effusion.  Electronically Signed  By: David Martinique  On: 07/21/2013 10:50  03/27/14-  52 yoF former smoker with COPD, complicated by chronic rhinitis, GERD,  DM O2 2-3.5L/ Advanced FOLLOWS FOR: O2 levels have been dropping in 80's, according to a caregiver who has her own oximeter- accuracy unknown. . SOB  as well. Occ wheeze and cough x several months- sometimes some green. Maintenance zithromax bronchitis suppression seems to work. CXR 02/19/14 IMPRESSION: Hyperinflation without acute finding. Electronically Signed  By: Lorin Picket M.D.  On: 02/19/2014 12:22  07/08/14- 4 yoF former smoker with COPD, complicated by chronic rhinitis, GERD,  DM FOLLOWS FOR: Pt reports breathing better on Stiolto. She had been off due to ins coverage, she is now back on since it is covered. She has not been back on Stiolto 1 week yet.  01/08/15- 46 yoF former  smoker with COPD, complicated by chronic rhinitis, GERD,  DM FOLLOWS FOR: Continues to wear O2 2-3 L/ Advanced Blames Stiolto inhaler for increased blood sugar although Stiolto does not contain steroid. To see ENT about throat tickle cough/foreign body sensation in throat  ROS-see HPI Constitutional:   No-   weight loss, night sweats, fevers, chills, fatigue, lassitude. HEENT:   No-  headaches, difficulty swallowing, tooth/dental problems, sore throat,        No-  sneezing, itching, ear ache, nasal congestion, post nasal drip,  CV:  No-   chest pain, orthopnea, PND, swelling in lower extremities, anasarca,                                                     dizziness, palpitations Resp: +shortness of breath with exertion or at rest.              + productive cough,  + non-productive cough,  No- coughing up of blood.              No-   change in color of mucus.  + wheezing.   Skin: No-   rash or lesions. GI:  +heartburn, indigestion, No-abdominal pain, nausea, vomiting,  GU: . MS:  No-   joint pain or swelling. Neuro-     nothing unusual Psych:  No- change in mood or affect. +depression or anxiety.  No memory loss.    Objective:  OBJ- Physical Exam    talkative General- Alert, Oriented, Affect-appropriate, Distress- none acute, + overweight Skin- rash-none, lesions- none, excoriation- none Lymphadenopathy- none Head- atraumatic            Eyes- Gross vision intact, PERRLA, conjunctivae and secretions clear            Ears- Hearing, canals-normal            Nose- Clear, no-Septal dev, mucus, polyps, erosion, perforation             Throat- Mallampati IV , mucosa -no thrush , drainage- none, tonsils- atrophic, + hoarse, stridor-none Neck- flexible , trachea midline, no stridor , thyroid nl, carotid no bruit Chest - symmetrical excursion , unlabored           Heart/CV- RRR , no murmur , no gallop  , no rub, nl s1 s2                           - JVD- none , edema- none, stasis changes- none, varices- none           Lung-  wheeze+trace, cough- none , dullness-none, rub- none           Chest wall-  Abd-  Br/ Gen/ Rectal- Not done, not indicated Extrem- cyanosis- none, clubbing, none, atrophy- none, strength- nl Neuro- grossly intact to observation

## 2015-01-11 DIAGNOSIS — J9611 Chronic respiratory failure with hypoxia: Secondary | ICD-10-CM | POA: Insufficient documentation

## 2015-01-11 NOTE — Assessment & Plan Note (Signed)
Very limited exercise tolerance now even with continuous oxygen 2-3 L/Advanced

## 2015-01-11 NOTE — Assessment & Plan Note (Addendum)
Near baseline. We discussed medication options. Plan-flu vaccine, continue Stiolto Respimat

## 2015-01-14 ENCOUNTER — Telehealth: Payer: Self-pay | Admitting: Endocrinology

## 2015-01-14 NOTE — Telephone Encounter (Signed)
error 

## 2015-01-14 NOTE — Telephone Encounter (Signed)
Pt called requesting a phone call back  From rhonda , pt refused to tell me what it was regarding call [phone]

## 2015-01-18 ENCOUNTER — Ambulatory Visit: Payer: Medicare Other | Admitting: Endocrinology

## 2015-01-25 ENCOUNTER — Telehealth: Payer: Self-pay | Admitting: Internal Medicine

## 2015-01-25 MED ORDER — AZITHROMYCIN 250 MG PO TABS: ORAL_TABLET | ORAL | Status: DC

## 2015-01-25 NOTE — Telephone Encounter (Signed)
Ok to offer Zpak and to stop Stiolto to see how she does without it.

## 2015-01-25 NOTE — Telephone Encounter (Signed)
Spoke with pt and advised of Dr Janee Morn recommendations.  Pt verbalized understanding and will try off Stiolto and see how she does.  Zpak rx sent to pharmacy.

## 2015-01-25 NOTE — Telephone Encounter (Signed)
Spoke with [name].  [name] c/o prod cough (clear), sob worse, runny nose.  Denies fever.  [name] is requesting Zpak to help with symptoms.  Also [name] states she doesn't feel like Stiolto is doing any good and also feels like it is causing her BS to go up.  She is working with [name] on this.  Please advise regarding symptoms and Stiolto. Allergies  Allergen Reactions  . Chlordiazepoxide-Clidinium Other (See Comments)    Sleepy,weak  . Biaxin [Clarithromycin]     Foul taste, abd pain, diarrhea  . Tramadol   . Codeine     REACTION: gi upset- only in high doses per [name].  Can tolerate cough syrup with codeine.    Current Outpatient Prescriptions on File Prior to Visit  Medication Sig Dispense Refill  . acetaminophen (TYLENOL) 500 MG tablet Take 500 mg by mouth every 6 (six) hours as needed for pain.     Marland Kitchen ALPRAZolam (XANAX) 0.5 MG tablet Take 1/2 tablet to 1 tablet two times a day 60 tablet 5  . Alum & Mag Hydroxide-Simeth (MAGIC MOUTHWASH) SOLN Swish with 2 teaspoonfuls by mouth as needed 240 mL 1  . aspirin 325 MG tablet Take 325 mg by mouth daily.      . bacitracin ophthalmic ointment   0  . BD INSULIN SYRINGE ULTRAFINE 31G X 15/64" 0.5 ML MISC   1  . cholestyramine (QUESTRAN) 4 G packet Take one package up to 2times a day as needed for diarrhea. 60 each 1  . ciprofloxacin (CIPRO) 500 MG tablet Take 1 tablet (500 mg total) by mouth 2 (two) times daily. 10 tablet 0  . dicyclomine (BENTYL) 10 MG capsule Take 1 capsule by mouth once daily 30 capsule 5  . diphenoxylate-atropine (LOMOTIL) 2.5-0.025 MG per tablet Take 1 tablet by mouth daily. 5 tablet 0  . glucose blood (ACCU-CHEK AVIVA PLUS) test strip Use as instructed to check blood sugars 8 times per day dx code 250.02 250 each 3  . insulin aspart (NOVOLOG FLEXPEN) 100 UNIT/ML FlexPen Inject 15 units three times a day 15 mL 3  . insulin glargine (LANTUS) 100 UNIT/ML injection Take 64 units in the morning and 24 units at bedtime 30 mL 3  . ipratropium  (ATROVENT HFA) 17 MCG/ACT inhaler Inhale 2 puffs into the lungs 2 (two) times daily as needed (shortness of breath). 1 Inhaler 11  . levalbuterol (XOPENEX HFA) 45 MCG/ACT inhaler Inhale 2 puffs into the lungs every 6 (six) hours as needed for wheezing or shortness of breath. 1 Inhaler 11  . Liraglutide (VICTOZA) 18 MG/3ML SOPN Inject 1.2 mg daily 6 mL 3  . meclizine (ANTIVERT) 25 MG tablet Take 1 tablet (25 mg total) by mouth 3 (three) times daily as needed for dizziness. 90 tablet 3  . metoprolol succinate (TOPROL-XL) 25 MG 24 hr tablet Take 1 tablet (25 mg total) by mouth daily. 30 tablet 5  . omeprazole (PRILOSEC) 40 MG capsule Take 1 capsule (40 mg total) by mouth daily. 30 capsule 2  . pravastatin (PRAVACHOL) 20 MG tablet Take 1 tablet (20 mg total) by mouth daily. 30 tablet 5  . Tiotropium Bromide-Olodaterol (STIOLTO RESPIMAT) 2.5-2.5 MCG/ACT AERS Inhale 2 puffs into the lungs daily. 1 Inhaler 11  . trimethoprim (TRIMPEX) 100 MG tablet   1  . valsartan (DIOVAN) 80 MG tablet Take 1 tablet (80 mg total) by mouth daily. 30 tablet 5  . VIGAMOX 0.5 % ophthalmic solution      No  current facility-administered medications on file prior to visit.

## 2015-02-01 ENCOUNTER — Other Ambulatory Visit: Payer: Self-pay | Admitting: Endocrinology

## 2015-02-09 DIAGNOSIS — R05 Cough: Secondary | ICD-10-CM | POA: Diagnosis not present

## 2015-02-09 DIAGNOSIS — J439 Emphysema, unspecified: Secondary | ICD-10-CM | POA: Diagnosis not present

## 2015-02-09 DIAGNOSIS — E119 Type 2 diabetes mellitus without complications: Secondary | ICD-10-CM | POA: Diagnosis not present

## 2015-02-09 DIAGNOSIS — K219 Gastro-esophageal reflux disease without esophagitis: Secondary | ICD-10-CM | POA: Diagnosis not present

## 2015-02-09 DIAGNOSIS — R1319 Other dysphagia: Secondary | ICD-10-CM | POA: Diagnosis not present

## 2015-02-10 ENCOUNTER — Other Ambulatory Visit: Payer: Self-pay | Admitting: Otolaryngology

## 2015-02-10 DIAGNOSIS — R1319 Other dysphagia: Secondary | ICD-10-CM

## 2015-02-15 ENCOUNTER — Ambulatory Visit: Payer: Medicare Other | Admitting: Endocrinology

## 2015-02-16 ENCOUNTER — Telehealth: Payer: Self-pay | Admitting: Internal Medicine

## 2015-02-16 MED ORDER — AZITHROMYCIN 250 MG PO TABS: ORAL_TABLET | ORAL | Status: DC

## 2015-02-16 MED ORDER — PREDNISONE 10 MG PO TABS: ORAL_TABLET | ORAL | Status: DC

## 2015-02-16 NOTE — Telephone Encounter (Signed)
likelky aecopd. Weather change also playing a role I think  Rx Z pak  Please take prednisone 40 mg x1 day, then 30 mg x1 day, then 20 mg x1 day, then 10 mg x1 day, and then 5 mg x1 day and stop

## 2015-02-16 NOTE — Telephone Encounter (Signed)
Rx sent to pharmacy. Patient notified. Nothing further needed.  

## 2015-02-16 NOTE — Telephone Encounter (Signed)
Attempted to call, busy x 2,. wcb

## 2015-02-16 NOTE — Telephone Encounter (Signed)
Called and spoke to pt. Pt c/o increase in wheezing, prod cough with clear mucus, sore throat, chest tightness at times x 2 days. Pt states her SOB is at baseline. Pt also states she was instructed by CY to stop Stiolto to see how she does without it because it raised her blood glucose to 200-342m/dL, pt stated she was not able to go without Stiolto for long as she became very SOB, so pt is back on Stiolto daily. Pt is requesting zpak for s/s. CY is unavailable, will send to doc of day.   Dr. RChase Callerplease advise. Thanks.

## 2015-02-24 ENCOUNTER — Telehealth: Payer: Self-pay | Admitting: Endocrinology

## 2015-02-24 ENCOUNTER — Ambulatory Visit
Admission: RE | Admit: 2015-02-24 | Discharge: 2015-02-24 | Disposition: A | Discharge status: Home / Self Care | Payer: Medicare Other | Source: Ambulatory Visit | Attending: Otolaryngology | Admitting: Otolaryngology

## 2015-02-24 ENCOUNTER — Encounter: Payer: Self-pay | Admitting: Endocrinology

## 2015-02-24 ENCOUNTER — Ambulatory Visit (INDEPENDENT_AMBULATORY_CARE_PROVIDER_SITE_OTHER): Payer: Medicare Other | Admitting: Endocrinology

## 2015-02-24 VITALS — BP 136/80 | HR 85 | Temp 98.2°F | Resp 16 | Ht 63.0 in | Wt 198.6 lb

## 2015-02-24 DIAGNOSIS — E1165 Type 2 diabetes mellitus with hyperglycemia: Secondary | ICD-10-CM

## 2015-02-24 DIAGNOSIS — Z794 Long term (current) use of insulin: Secondary | ICD-10-CM

## 2015-02-24 DIAGNOSIS — R1310 Dysphagia, unspecified: Secondary | ICD-10-CM | POA: Diagnosis not present

## 2015-02-24 DIAGNOSIS — F32A Depression, unspecified: Secondary | ICD-10-CM

## 2015-02-24 DIAGNOSIS — T17308A Unspecified foreign body in larynx causing other injury, initial encounter: Secondary | ICD-10-CM

## 2015-02-24 DIAGNOSIS — F329 Major depressive disorder, single episode, unspecified: Secondary | ICD-10-CM

## 2015-02-24 DIAGNOSIS — M545 Low back pain: Secondary | ICD-10-CM

## 2015-02-24 DIAGNOSIS — K449 Diaphragmatic hernia without obstruction or gangrene: Secondary | ICD-10-CM | POA: Diagnosis not present

## 2015-02-24 DIAGNOSIS — R1319 Other dysphagia: Secondary | ICD-10-CM

## 2015-02-24 MED ORDER — ESCITALOPRAM OXALATE 10 MG PO TABS: 10.0000 mg | ORAL_TABLET | Freq: Every day | ORAL | Status: DC

## 2015-02-24 NOTE — Telephone Encounter (Signed)
Please call pt back she has questions about what is on her AVS

## 2015-02-24 NOTE — Progress Notes (Signed)
Patient ID: [name], female   DOB: August 23, 1935, 79 y.o.   MRN: [id number]   Reason for Appointment: follow-up of multiple problems  History of Present Illness    DIABETES type II since 1980   She has been on insulin using Lantus and Humalog for several years and usually A1c around 8% despite fairly good compliance with diet, exercise and glucose monitoring. She also had been on Actos previously which probably benefited her especially with her fatty liver but she stopped it because of fear of side effects      Recent history:  Her A1c has been consistently around 10% more recently She is taking significantly larger doses of Lantus in the morning over the last few months She was also recommended a trial of Invokana but she did not do this for fear of yeast infections Blood sugar is still not well controlled  She does have the following patterns:  Overnight/early morning readings have been variable but recently some morning readings have been near normal  HIGHEST blood sugars are late at night  Overall blood sugars are trending higher from morning to evening  No hypoglycemia and only occasionally has readings around 140 in the afternoon  She thinks her carbohydrate intake is control and sugars are not higher because of increased food intake or snacking  Still not able to do much exercise  Insulin regimen: Lantus twice a day, 64--24 with syringe and Novolog 10 acb, acl; acs 10-12 Oral hypoglycemic drugs: none       Side effects from medications: None         Proper timing of medications in relation to meals: Yes.         Monitors blood glucose:  5 times a day   Glucometer:  Accu-Chek         Blood Glucose averages recently from meter download recently :  Mean values apply above for all meters except median for One Touch  PRE-MEAL Fasting Lunch Dinner Bedtime Overall  Glucose range:  121-254       187-294    Mean/median:  174   209  218  230 209   POST-MEAL PC  Breakfast PC Lunch  overnight   Glucose range:    200    oglycemia:  none          Meals: 3 meals per day.  Smaller portions; lunch 1-2 pm Supper 6 to 7 PM     Physical activity: exercise: Unable to do any because of dyspnea and fatigue           Dietician visit: Most recent: Several years ago           Complications: are: Peripheral neuropathy with sensory loss     Wt Readings from Last 3 Encounters:  02/24/15 198 lb 9.6 oz (90.084 kg)  01/08/15 200 lb 12.8 oz (91.082 kg)  11/16/14 199 lb 6.4 oz (90.447 kg)    Lab Results  Component Value Date   HGBA1C 9.9* 12/11/2014   HGBA1C 9.9* 10/14/2014   HGBA1C 10.0* 07/17/2014   Lab Results  Component Value Date   MICROALBUR 6.9* 10/14/2014   LDLCALC 124* 12/16/2013   CREATININE 0.68 12/11/2014    OTHER problems including complaints of joint pains are detailed in review of systems     Medication List       This list is accurate as of: 02/24/15 11:59 PM.  Always use your most recent med list.  acetaminophen 500 MG tablet  Commonly known as:  TYLENOL  Take 500 mg by mouth every 6 (six) hours as needed for pain.     ALPRAZolam 0.5 MG tablet  Commonly known as:  XANAX  Take 1/2 tablet to 1 tablet two times a day     aspirin 325 MG tablet  Take 325 mg by mouth daily.     azithromycin 250 MG tablet  Commonly known as:  ZITHROMAX  Use as directed.     B-D ULTRAFINE III SHORT PEN 31G X 8 MM Misc  Generic drug:  Insulin Pen Needle  use three times a day     bacitracin ophthalmic ointment     BD INSULIN SYRINGE ULTRAFINE 31G X 15/64" 0.5 ML Misc  Generic drug:  Insulin Syringe-Needle U-100     cholestyramine 4 G packet  Commonly known as:  QUESTRAN  Take one package up to 2times a day as needed for diarrhea.     ciprofloxacin 500 MG tablet  Commonly known as:  CIPRO  Take 1 tablet (500 mg total) by mouth 2 (two) times daily.     dicyclomine 10 MG capsule  Commonly known as:  BENTYL  Take 1  capsule by mouth once daily     diphenoxylate-atropine 2.5-0.025 MG tablet  Commonly known as:  LOMOTIL  Take 1 tablet by mouth daily.     escitalopram 10 MG tablet  Commonly known as:  LEXAPRO  Take 1 tablet (10 mg total) by mouth daily.     glucose blood test strip  Commonly known as:  ACCU-CHEK AVIVA PLUS  Use as instructed to check blood sugars 8 times per day dx code 250.02     insulin aspart 100 UNIT/ML FlexPen  Commonly known as:  NOVOLOG FLEXPEN  Inject 15 units three times a day     insulin glargine 100 UNIT/ML injection  Commonly known as:  LANTUS  Take 64 units in the morning and 24 units at bedtime     ipratropium 17 MCG/ACT inhaler  Commonly known as:  ATROVENT HFA  Inhale 2 puffs into the lungs 2 (two) times daily as needed (shortness of breath).     levalbuterol 45 MCG/ACT inhaler  Commonly known as:  XOPENEX HFA  Inhale 2 puffs into the lungs every 6 (six) hours as needed for wheezing or shortness of breath.     magic mouthwash Soln  Swish with 2 teaspoonfuls by mouth as needed     meclizine 25 MG tablet  Commonly known as:  ANTIVERT  Take 1 tablet (25 mg total) by mouth 3 (three) times daily as needed for dizziness.     metoprolol succinate 25 MG 24 hr tablet  Commonly known as:  TOPROL-XL  Take 1 tablet (25 mg total) by mouth daily.     omeprazole 40 MG capsule  Commonly known as:  PRILOSEC  Take 1 capsule (40 mg total) by mouth daily.     omeprazole 20 MG capsule  Commonly known as:  PRILOSEC  take 1 capsule by mouth twice a day before meals     pravastatin 20 MG tablet  Commonly known as:  PRAVACHOL  Take 1 tablet (20 mg total) by mouth daily.     predniSONE 10 MG tablet  Commonly known as:  DELTASONE  4 tablets x 1 day, 3 tablets x 1, 2 tablets x 1, 1 tablet x 1 day, 1/2 x 1 day, then STOP     Tiotropium Bromide-Olodaterol 2.5-2.5 MCG/ACT  Aers  Commonly known as:  STIOLTO RESPIMAT  Inhale 2 puffs into the lungs daily.     trimethoprim  100 MG tablet  Commonly known as:  TRIMPEX     valsartan 80 MG tablet  Commonly known as:  DIOVAN  Take 1 tablet (80 mg total) by mouth daily.     VIGAMOX 0.5 % ophthalmic solution  Generic drug:  moxifloxacin        Allergies:  Allergies  Allergen Reactions  . Chlordiazepoxide-Clidinium Other (See Comments)    Sleepy,weak  . Biaxin [Clarithromycin]     Foul taste, abd pain, diarrhea  . Tramadol   . Codeine     REACTION: gi upset- only in high doses per pt.  Can tolerate cough syrup with codeine.     Past Medical History  Diagnosis Date  . Irritable bowel syndrome   . Other chronic nonalcoholic liver disease   . Esophageal reflux   . Allergic rhinitis, cause unspecified     Sinus CT Rec 12-23-2009  . 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 Sciatica   . Shingles 2010  . Steatohepatitis   . Diabetes mellitus   . Diverticulosis   . Hiatal hernia   . Esophageal stricture   . Diarrhea   . GERD (gastroesophageal reflux disease)   . Shortness of breath   . Hypertension   . Arthritis   . Glaucoma   . Complication of anesthesia     takes along time to wake up  . Oxygen deficiency   . Benign paroxysmal positional vertigo 08/09/2012  . Neuropathy in diabetes ([place]) 08/09/2012    Past Surgical History  Procedure Laterality Date  . Cholecystectomy open  1978  . Liver biopsy  08-1992  . Parathyroid exploration    . Tonsillectomy and adenoidectomy    . Total abdominal hysterectomy    . Esophagogastroduodenoscopy  0938,18-29    H Hernia,es.stricture s/p dil 28F  . Colonoscopy  07-2001    mild diverticulosis  . Carpal tunnel release      right hand  . Cataract extraction      Bilateral  . Ulnar nerve transposition  12/07/2011    Procedure: ULNAR NERVE DECOMPRESSION/TRANSPOSITION;this was cancelled-not done  Surgeon: Cammie Sickle., MD;  Location: [facility] [facility];  Service: Orthopedics;  Laterality:  Right;  right ulnar nerve in situ decompression  . Ulnar tunnel release  03/07/2012    Procedure: CUBITAL TUNNEL RELEASE;  Surgeon: Roseanne Kaufman, MD;  Location: [facility];  Service: Orthopedics;  Laterality: Right;  ulnar nerve release at the elbow      Family History  Problem Relation Age of Onset  . Lung cancer Mother     small cell;Byssinosis  . Heart disease Father   . Lung cancer Sister   . Liver cancer Sister     ? mets from another area of the body  . Diabetes      grandmother  . Stroke Maternal Grandfather     Social History:  reports that she quit smoking about 26 years ago. Her smoking use included Cigarettes. She has never used smokeless tobacco. She reports that she does not drink alcohol or use illicit drugs.  Review of Systems:  CHOKING episodes: She says that periodically she will get a feeling of choking and a spasm in her throat area with difficulty breathing but without wheezing in her lungs.  Sometimes while eating she  may feel choked also.  Occasionally with her choking she may feel dizzy also.  She has been evaluated by ENT surgeon and her barium swallow is negative. Has discussed the problem with her pulmonologist also  DEPRESSION: She has been feeling relatively low because of her favorite cat having severe medical problems and is also concerned about her declining health overall   LOW back pain, left hip pain: She is supposedly having severe pain which is intermittent and reportedly from her severe  scoliosis and is not benefiting from visits to the orthopedic surgeon  HYPERTENSION:   blood pressure is  well-controlled with 80 mg Diovan.   Occasionally checks blood pressure at home but generally gets variable readings She takes Xanax at night mostly and lorazepam in the daytime for anxiety  HYPERLIPIDEMIA:  The lipid abnormality consists of elevated LDL treated with pravastatin 20 mg only which she has no side effects from    Her last LDL  is improved below 100  Since her triglycerides Are still relatively high   Lab Results  Component Value Date   CHOL 154 10/14/2014   HDL 42.90 10/14/2014   LDLCALC 124* 12/16/2013   LDLDIRECT 89.0 10/14/2014   TRIG 233.0* 10/14/2014   CHOLHDL 4 10/14/2014    She is followed by pulmonologist for chronic COPD, taking home oxygen and symptoms are cough, dyspnea and recurrent bronchitis, slightly better with new inhaler  Dizziness- still has occasional dizzy spells of unclear etiology   Diarrhea: She has had long-standing intermittent diarrhea possibly IBS. Is taking Questran as needed, may take this daily. Also does not take it twice a day because of fear of constipation.    Occasionally takes Lomotil.    NEUROPATHY: She has numbness in her feet.  Also complaining of some tingling and numbness in her hands Foot exam done in 8/15: Foot exam shows sensory loss distally, normal pulses and no skin lesions   Examination:   BP 136/80 mmHg  Pulse 85  Temp(Src) 98.2 F (36.8 C)  Resp 16  Ht 5' 3"  (1.6 m)  Wt 198 lb 9.6 oz (90.084 kg)  BMI 35.19 kg/m2  SpO2 90%  Body mass index is 35.19 kg/(m^2).    thyroid not palpable.  No lymphadenopathy or masses in the neck. Lungs clear No stridor No pedal edema   ASSESSMENT/ PLAN:   Diabetes type 2   The patient's diabetes control is  overall poor and requiring very large doses of insulin consistently Her last A1c is again nearly 10% See history of present illness for detailed discussion of his current management, blood sugar patterns and problems identified  She has progressively high readings in the afternoons and evenings despite taking large doses of morning Lantus She refuses to consider additional medications such as Victoza which was prescribed on her last visit Also was reluctant to change her insulin even though she was explained that Toujeo or the U-200 Tresiba may be better and more effective even with on Saturday  She was  recommended increasing her Lantus by 6 units in the morning and increasing her Humalog by 4 units at suppertime She will call if blood sugars are not consistently controlled  Choking episodes: Etiology is unclear.  Not clear if this could be anxiety related She will need to discuss with pulmonologist also  Probable depression: She agrees to a trial of Lexapro 10 mg daily  Pain clinic referral done for her low back pain  Counseling time over 50% of today's 25 minute  visit on above topics   Patient Instructions  Increase Lantus to 70 in am and Novolog by 4 units at supper       [facility] 02/25/2015, 10:04 AM

## 2015-02-24 NOTE — Patient Instructions (Addendum)
Increase Lantus to 70 in am and Novolog by 4 units at supper

## 2015-02-25 ENCOUNTER — Other Ambulatory Visit: Payer: Self-pay | Admitting: *Deleted

## 2015-02-25 MED ORDER — INSULIN GLARGINE 100 UNIT/ML SOLOSTAR PEN: PEN_INJECTOR | SUBCUTANEOUS | Status: DC

## 2015-02-25 MED ORDER — MAGIC MOUTHWASH: ORAL | Status: DC

## 2015-02-25 NOTE — Telephone Encounter (Signed)
Please call pt back she has questions about what is on her AVS

## 2015-02-25 NOTE — Telephone Encounter (Signed)
I spoke with Mrs. Kachmar this morning about some questions about her medications, she was asking about the Lexapro that Dr. Dwyane Dee wanted her to start taking, she said she wanted to hold off on that for now. She did request that her Magic Mouthwash be sent in, Rx was faxed to her pharmacy.

## 2015-03-01 ENCOUNTER — Telehealth: Payer: Self-pay | Admitting: Endocrinology

## 2015-03-01 NOTE — Telephone Encounter (Signed)
Please call pt when you can she is having trouble with her insulin pen

## 2015-03-02 NOTE — Telephone Encounter (Signed)
I spoke with [name], she states that when she tries to inject her 70 units of lantus the pen gets stuck, I told her to try a new pen to see if it did the same thing, she said she would start it tomorrow morning. I also told her that she can call the company and try to figure out what's going on.

## 2015-03-16 ENCOUNTER — Telehealth: Payer: Self-pay | Admitting: Internal Medicine

## 2015-03-16 MED ORDER — AZITHROMYCIN 250 MG PO TABS: ORAL_TABLET | ORAL | Status: DC

## 2015-03-16 MED ORDER — BENZONATATE 200 MG PO CAPS
200.0000 mg | ORAL_CAPSULE | Freq: Three times a day (TID) | ORAL | Status: DC | PRN
Start: 2015-03-16 — End: 2015-06-08

## 2015-03-16 NOTE — Telephone Encounter (Signed)
Called and spoke with pt and informed of CY rec Pt voiced understanding of new medication and instructions Pharmacy verified with pt  Order sent electronically to pt's pharmacy Nothing further is needed at this time.

## 2015-03-16 NOTE — Telephone Encounter (Signed)
Offer benzonatate perles 200 mg, # 30, 1 every 8 hours as needed for cough  and          Z pak

## 2015-03-16 NOTE — Telephone Encounter (Signed)
Spoke with pt, c/o cough since last night, fatigue, prod cough with mucus.  Denies fever.   Pt has not taken anything to help with cough.  Pt requesting recs, particularly a cough syrup and/or abx.   Pt uses [facility] on [address].    Last ov: 01/08/15 Next ov: 07/08/15  CY please advise.  Thanks!  Allergies  Allergen Reactions  . Chlordiazepoxide-Clidinium Other (See Comments)    Sleepy,weak  . Biaxin [Clarithromycin]     Foul taste, abd pain, diarrhea  . Tramadol   . Codeine     REACTION: gi upset- only in high doses per pt.  Can tolerate cough syrup with codeine.    Current Outpatient Prescriptions on File Prior to Visit  Medication Sig Dispense Refill  . acetaminophen (TYLENOL) 500 MG tablet Take 500 mg by mouth every 6 (six) hours as needed for pain.     Marland Kitchen ALPRAZolam (XANAX) 0.5 MG tablet Take 1/2 tablet to 1 tablet two times a day 60 tablet 5  . aspirin 325 MG tablet Take 325 mg by mouth daily.      Marland Kitchen azithromycin (ZITHROMAX) 250 MG tablet Use as directed. 6 each 0  . B-D ULTRAFINE III SHORT PEN 31G X 8 MM MISC use three times a day 100 each 5  . bacitracin ophthalmic ointment   0  . BD INSULIN SYRINGE ULTRAFINE 31G X 15/64" 0.5 ML MISC   1  . cholestyramine (QUESTRAN) 4 G packet Take one package up to 2times a day as needed for diarrhea. 60 each 1  . ciprofloxacin (CIPRO) 500 MG tablet Take 1 tablet (500 mg total) by mouth 2 (two) times daily. (Patient not taking: Reported on 02/24/2015) 10 tablet 0  . dicyclomine (BENTYL) 10 MG capsule Take 1 capsule by mouth once daily 30 capsule 5  . diphenoxylate-atropine (LOMOTIL) 2.5-0.025 MG per tablet Take 1 tablet by mouth daily. (Patient not taking: Reported on 02/24/2015) 5 tablet 0  . escitalopram (LEXAPRO) 10 MG tablet Take 1 tablet (10 mg total) by mouth daily. 30 tablet 2  . glucose blood (ACCU-CHEK AVIVA PLUS) test strip Use as instructed to check blood sugars 8 times per day dx code 250.02 250 each 3  . insulin aspart (NOVOLOG  FLEXPEN) 100 UNIT/ML FlexPen Inject 15 units three times a day 15 mL 3  . Insulin Glargine (LANTUS SOLOSTAR) 100 UNIT/ML Solostar Pen Inject 70 units every morning and 24 units in the evening. 10 pen 3  . ipratropium (ATROVENT HFA) 17 MCG/ACT inhaler Inhale 2 puffs into the lungs 2 (two) times daily as needed (shortness of breath). 1 Inhaler 11  . levalbuterol (XOPENEX HFA) 45 MCG/ACT inhaler Inhale 2 puffs into the lungs every 6 (six) hours as needed for wheezing or shortness of breath. 1 Inhaler 11  . magic mouthwash SOLN Swish with 2 teaspoonfuls by mouth as needed 240 mL 3  . meclizine (ANTIVERT) 25 MG tablet Take 1 tablet (25 mg total) by mouth 3 (three) times daily as needed for dizziness. 90 tablet 3  . metoprolol succinate (TOPROL-XL) 25 MG 24 hr tablet Take 1 tablet (25 mg total) by mouth daily. 30 tablet 5  . omeprazole (PRILOSEC) 20 MG capsule take 1 capsule by mouth twice a day before meals  0  . omeprazole (PRILOSEC) 40 MG capsule Take 1 capsule (40 mg total) by mouth daily. 30 capsule 2  . pravastatin (PRAVACHOL) 20 MG tablet Take 1 tablet (20 mg total) by mouth daily. [place]  tablet 5  . predniSONE (DELTASONE) 10 MG tablet 4 tablets x 1 day, 3 tablets x 1, 2 tablets x 1, 1 tablet x 1 day, 1/2 x 1 day, then STOP 11 tablet 0  . Tiotropium Bromide-Olodaterol (STIOLTO RESPIMAT) 2.5-2.5 MCG/ACT AERS Inhale 2 puffs into the lungs daily. 1 Inhaler 11  . trimethoprim (TRIMPEX) 100 MG tablet   1  . valsartan (DIOVAN) 80 MG tablet Take 1 tablet (80 mg total) by mouth daily. 30 tablet 5  . VIGAMOX 0.5 % ophthalmic solution      No current facility-administered medications on file prior to visit.

## 2015-03-24 ENCOUNTER — Other Ambulatory Visit: Payer: Self-pay | Admitting: *Deleted

## 2015-03-24 ENCOUNTER — Telehealth: Payer: Self-pay | Admitting: *Deleted

## 2015-03-24 MED ORDER — CIPROFLOXACIN HCL 500 MG PO TABS: 500.0000 mg | ORAL_TABLET | Freq: Two times a day (BID) | ORAL | Status: DC

## 2015-03-24 NOTE — Telephone Encounter (Signed)
Cipro 500bid #10

## 2015-03-24 NOTE — Telephone Encounter (Signed)
rx sent. Patient is aware

## 2015-03-24 NOTE — Telephone Encounter (Signed)
Patient called, she said she has cystitis again, she's had burning X 2 days.  Feels terrible, very tired and lethargic.

## 2015-03-30 ENCOUNTER — Other Ambulatory Visit: Payer: Self-pay | Admitting: Endocrinology

## 2015-04-08 ENCOUNTER — Ambulatory Visit (INDEPENDENT_AMBULATORY_CARE_PROVIDER_SITE_OTHER): Payer: Medicare Other | Admitting: Emergency Medicine

## 2015-04-08 ENCOUNTER — Ambulatory Visit (INDEPENDENT_AMBULATORY_CARE_PROVIDER_SITE_OTHER): Payer: Medicare Other

## 2015-04-08 VITALS — BP 132/80 | HR 105 | Temp 97.9°F | Resp 18 | Ht 63.0 in | Wt 193.6 lb

## 2015-04-08 DIAGNOSIS — J441 Chronic obstructive pulmonary disease with (acute) exacerbation: Secondary | ICD-10-CM

## 2015-04-08 DIAGNOSIS — N3000 Acute cystitis without hematuria: Secondary | ICD-10-CM

## 2015-04-08 DIAGNOSIS — M545 Low back pain, unspecified: Secondary | ICD-10-CM

## 2015-04-08 LAB — POCT URINALYSIS DIP (MANUAL ENTRY)
BILIRUBIN UA: NEGATIVE
Ketones, POC UA: NEGATIVE
NITRITE UA: NEGATIVE
Protein Ur, POC: 100 — AB
Spec Grav, UA: 1.02
Urobilinogen, UA: 0.2
pH, UA: 5.5

## 2015-04-08 LAB — POC MICROSCOPIC URINALYSIS (UMFC): MUCUS RE: ABSENT

## 2015-04-08 MED ORDER — LEVOFLOXACIN 500 MG PO TABS: 500.0000 mg | ORAL_TABLET | Freq: Every day | ORAL | Status: AC

## 2015-04-08 NOTE — Patient Instructions (Signed)

## 2015-04-08 NOTE — Progress Notes (Signed)
Subjective:  Patient ID: [name], female    DOB: 1935/05/31  Age: 79 y.o. MRN: [id number]  CC: Cystitis and Back Pain   HPI [name] presents   She has a history of COPD and is on continuous oxygen. She has an increasing cough productive of purulent sputum. She has no increasing shortness breath or wheezing. She is tolerating her medication but they're not controlling her symptoms currently. She also has dysuria urgency and frequency and cloudy urine. She has a history of chronic recurrent cystitis and is convinced that she has another urinary tract infection she has some mid back pain that seems related to her persistent cough. She has no nausea vomiting or stool change. No fever or chills.  History [name] has a past medical history of Irritable bowel syndrome; Other chronic nonalcoholic liver disease; Esophageal reflux; Allergic rhinitis, cause unspecified; Chronic airway obstruction, not elsewhere classified; Sciatica; Shingles (2010); Steatohepatitis; Diabetes mellitus; Diverticulosis; Hiatal hernia; Esophageal stricture; Diarrhea; GERD (gastroesophageal reflux disease); Shortness of breath; Hypertension; Arthritis; Glaucoma; Complication of anesthesia; Oxygen deficiency; Benign paroxysmal positional vertigo (08/09/2012); and Neuropathy in diabetes ([place]) (08/09/2012).   She has past surgical history that includes Cholecystectomy open (1978); Liver biopsy (0-8144); Parathyroid exploration; Tonsillectomy and adenoidectomy; Total abdominal hysterectomy; Esophagogastroduodenoscopy (8185,63-14); Colonoscopy (07-2001); Carpal tunnel release; Cataract extraction; Ulnar nerve transposition (12/07/2011); Ulnar tunnel release (03/07/2012); Appendectomy; and Eye surgery.   Her  family history includes Diabetes in an other family member; Heart disease in her father; Liver cancer in her sister; Lung cancer in her mother and sister; Stroke in her maternal grandfather.  She   reports that she  quit smoking about 26 years ago. Her smoking use included Cigarettes. She has never used smokeless tobacco. She reports that she does not drink alcohol or use illicit drugs.  Outpatient Prescriptions Prior to Visit  Medication Sig Dispense Refill  . acetaminophen (TYLENOL) 500 MG tablet Take 500 mg by mouth every 6 (six) hours as needed for pain.     Marland Kitchen ALPRAZolam (XANAX) 0.5 MG tablet Take 1/2 tablet to 1 tablet two times a day 60 tablet 5  . aspirin 325 MG tablet Take 325 mg by mouth daily.      . B-D ULTRAFINE III SHORT PEN 31G X 8 MM MISC use three times a day 100 each 5  . bacitracin ophthalmic ointment   0  . BD INSULIN SYRINGE ULTRAFINE 31G X 15/64" 0.5 ML MISC   1  . cholestyramine (QUESTRAN) 4 G packet Take one package up to 2times a day as needed for diarrhea. 60 each 1  . ciprofloxacin (CIPRO) 500 MG tablet Take 1 tablet (500 mg total) by mouth 2 (two) times daily. 10 tablet 0  . dicyclomine (BENTYL) 10 MG capsule Take 1 capsule by mouth once daily 30 capsule 5  . escitalopram (LEXAPRO) 10 MG tablet Take 1 tablet (10 mg total) by mouth daily. 30 tablet 2  . glucose blood (ACCU-CHEK AVIVA PLUS) test strip Use as instructed to check blood sugars 8 times per day dx code 250.02 250 each 3  . Insulin Glargine (LANTUS SOLOSTAR) 100 UNIT/ML Solostar Pen Inject 70 units every morning and 24 units in the evening. 10 pen 3  . levalbuterol (XOPENEX HFA) 45 MCG/ACT inhaler Inhale 2 puffs into the lungs every 6 (six) hours as needed for wheezing or shortness of breath. 1 Inhaler 11  . magic mouthwash SOLN Swish with 2 teaspoonfuls by mouth as needed 240 mL 3  .  meclizine (ANTIVERT) 25 MG tablet Take 1 tablet (25 mg total) by mouth 3 (three) times daily as needed for dizziness. 90 tablet 3  . metoprolol succinate (TOPROL-XL) 25 MG 24 hr tablet Take 1 tablet (25 mg total) by mouth daily. 30 tablet 5  . NOVOLOG FLEXPEN 100 UNIT/ML FlexPen INJECT 15 UNITS 3 TIMES A DAY 15 mL 3  . omeprazole (PRILOSEC)  20 MG capsule take 1 capsule by mouth twice a day before meals  0  . omeprazole (PRILOSEC) 40 MG capsule Take 1 capsule (40 mg total) by mouth daily. 30 capsule 2  . pravastatin (PRAVACHOL) 20 MG tablet Take 1 tablet (20 mg total) by mouth daily. 30 tablet 5  . predniSONE (DELTASONE) 10 MG tablet 4 tablets x 1 day, 3 tablets x 1, 2 tablets x 1, 1 tablet x 1 day, 1/2 x 1 day, then STOP 11 tablet 0  . Tiotropium Bromide-Olodaterol (STIOLTO RESPIMAT) 2.5-2.5 MCG/ACT AERS Inhale 2 puffs into the lungs daily. 1 Inhaler 11  . trimethoprim (TRIMPEX) 100 MG tablet   1  . valsartan (DIOVAN) 80 MG tablet Take 1 tablet (80 mg total) by mouth daily. 30 tablet 5  . VIGAMOX 0.5 % ophthalmic solution     . azithromycin (ZITHROMAX Z-PAK) 250 MG tablet Use as directed (Patient not taking: Reported on 04/08/2015) 6 each 0  . azithromycin (ZITHROMAX) 250 MG tablet Use as directed. (Patient not taking: Reported on 04/08/2015) 6 each 0  . benzonatate (TESSALON) 200 MG capsule Take 1 capsule (200 mg total) by mouth every 8 (eight) hours as needed for cough. (Patient not taking: Reported on 04/08/2015) 30 capsule 1  . diphenoxylate-atropine (LOMOTIL) 2.5-0.025 MG per tablet Take 1 tablet by mouth daily. (Patient not taking: Reported on 02/24/2015) 5 tablet 0  . ipratropium (ATROVENT HFA) 17 MCG/ACT inhaler Inhale 2 puffs into the lungs 2 (two) times daily as needed (shortness of breath). 1 Inhaler 11   No facility-administered medications prior to visit.    Social History   Social History  . Marital Status: Single    Spouse Name: N/A  . Number of Children: N/A  . Years of Education: N/A   Occupational History  . Retired     Social History Main Topics  . Smoking status: Former Smoker    Types: Cigarettes    Quit date: 05/08/1988  . Smokeless tobacco: Never Used  . Alcohol Use: No  . Drug Use: No  . Sexual Activity: No   Other Topics Concern  . None   Social History Narrative     Review of Systems    Constitutional: Negative for fever, chills and appetite change.  HENT: Negative for congestion, ear pain, postnasal drip, sinus pressure and sore throat.   Eyes: Negative for pain and redness.  Respiratory: Positive for cough and shortness of breath. Negative for wheezing.   Cardiovascular: Negative for leg swelling.  Gastrointestinal: Negative for nausea, vomiting, abdominal pain, diarrhea, constipation and blood in stool.  Endocrine: Negative for polyuria.  Genitourinary: Positive for dysuria, urgency and frequency. Negative for flank pain.  Musculoskeletal: Positive for back pain. Negative for gait problem.  Skin: Negative for rash.  Neurological: Negative for weakness and headaches.  Psychiatric/Behavioral: Negative for confusion and decreased concentration. The patient is not nervous/anxious.     Objective:  BP 132/80 mmHg  Pulse 105  Temp(Src) 97.9 F (36.6 C) (Oral)  Resp 18  Ht 5' 3"  (1.6 m)  Wt 193 lb 9.6 oz (87.816 kg)  BMI 34.30 kg/m2  SpO2 95%  Physical Exam  Constitutional: She is oriented to person, place, and time. She appears well-developed and well-nourished. No distress.  HENT:  Head: Normocephalic and atraumatic.  Right Ear: External ear normal.  Left Ear: External ear normal.  Nose: Nose normal.  Eyes: Conjunctivae and EOM are normal. Pupils are equal, round, and reactive to light. No scleral icterus.  Neck: Normal range of motion. Neck supple. No tracheal deviation present.  Cardiovascular: Normal rate, regular rhythm and normal heart sounds.   Pulmonary/Chest: Effort normal. No respiratory distress. She has no wheezes. She has no rales.  Abdominal: She exhibits no mass. There is no tenderness. There is no rebound and no guarding.  Musculoskeletal: She exhibits no edema.  Lymphadenopathy:    She has no cervical adenopathy.  Neurological: She is alert and oriented to person, place, and time. Coordination normal.  Skin: Skin is warm and dry. No rash  noted.  Psychiatric: She has a normal mood and affect. Her behavior is normal.      Assessment & Plan:   [name] was seen today for cystitis and back pain.  Diagnoses and all orders for this visit:  Bronchitis, chronic obstructive, with exacerbation ([place]) -     DG Chest 2 View; Future -     Cancel: POCT Wet + KOH Prep -     POCT Microscopic Urinalysis ([facility])  Acute cystitis without hematuria -     DG Chest 2 View; Future -     Cancel: POCT Wet + KOH Prep -     POCT Microscopic Urinalysis ([facility]) -     POCT urinalysis dipstick  Bilateral low back pain without sciatica  Other orders -     levofloxacin (LEVAQUIN) 500 MG tablet; Take 1 tablet (500 mg total) by mouth daily.  I am having Ms. Wellons start on levofloxacin. I am also having her maintain her aspirin, acetaminophen, VIGAMOX, glucose blood, meclizine, ipratropium, bacitracin, BD INSULIN SYRINGE ULTRAFINE, dicyclomine, trimethoprim, Tiotropium Bromide-Olodaterol, cholestyramine, omeprazole, ALPRAZolam, valsartan, metoprolol succinate, pravastatin, levalbuterol, diphenoxylate-atropine, B-D ULTRAFINE III SHORT PEN, azithromycin, predniSONE, omeprazole, escitalopram, Insulin Glargine, magic mouthwash, azithromycin, benzonatate, ciprofloxacin, and NOVOLOG FLEXPEN.  Meds ordered this encounter  Medications  . levofloxacin (LEVAQUIN) 500 MG tablet    Sig: Take 1 tablet (500 mg total) by mouth daily.    Dispense:  7 tablet    Refill:  0    Appropriate red flag conditions were discussed with the patient as well as actions that should be taken.  Patient expressed his understanding.  Follow-up: Return if symptoms worsen or fail to improve.  Roselee Culver, MD   Results for orders placed or performed in visit on 04/08/15  POCT Microscopic Urinalysis ([facility])  Result Value Ref Range   WBC,UR,HPF,POC Too numerous to count  (A) None WBC/hpf   RBC,UR,HPF,POC Few (A) None RBC/hpf   Bacteria Few (A) None, Too numerous to count    Mucus Absent Absent   Epithelial Cells, UR Per Microscopy Few (A) None, Too numerous to count cells/hpf  POCT urinalysis dipstick  Result Value Ref Range   Color, UA yellow yellow   Clarity, UA cloudy (A) clear   Glucose, UA =100 (A) negative   Bilirubin, UA negative negative   Ketones, POC UA negative negative   Spec Grav, UA 1.020    Blood, UA large (A) negative   pH, UA 5.5    Protein Ur, POC =100 (A) negative   Urobilinogen, UA 0.2  Nitrite, UA Negative Negative   Leukocytes, UA large (3+) (A) Negative     [facility] reading (PRIMARY) by  Dr. Ouida Sills.  Unchanged from prior study.

## 2015-04-12 ENCOUNTER — Other Ambulatory Visit: Payer: Self-pay | Admitting: *Deleted

## 2015-04-12 MED ORDER — INSULIN PEN NEEDLE 32G X 4 MM MISC: Status: DC

## 2015-04-13 ENCOUNTER — Telehealth: Payer: Self-pay | Admitting: Endocrinology

## 2015-04-13 NOTE — Telephone Encounter (Signed)
Pt is requesting we change the size of her syringe needles please to 100 5 mm 31

## 2015-04-15 ENCOUNTER — Other Ambulatory Visit: Payer: Self-pay | Admitting: *Deleted

## 2015-04-15 MED ORDER — INSULIN PEN NEEDLE 31G X 5 MM MISC: Status: DC

## 2015-04-15 NOTE — Telephone Encounter (Signed)
rx sent

## 2015-04-16 ENCOUNTER — Other Ambulatory Visit: Payer: Self-pay | Admitting: Endocrinology

## 2015-04-16 ENCOUNTER — Telehealth: Payer: Self-pay | Admitting: Endocrinology

## 2015-04-16 NOTE — Telephone Encounter (Signed)
Pt needs syringes not needles she needs the insulin syringe 100 8 mm 31 G please call in asap

## 2015-04-16 NOTE — Telephone Encounter (Signed)
I spoke with the pharmacist and gave a verbal for the Insulin syringes.

## 2015-04-19 ENCOUNTER — Other Ambulatory Visit: Payer: Self-pay | Admitting: *Deleted

## 2015-04-19 ENCOUNTER — Telehealth: Payer: Self-pay | Admitting: Endocrinology

## 2015-04-19 MED ORDER — METHOCARBAMOL 500 MG PO TABS: ORAL_TABLET | ORAL | Status: DC

## 2015-04-19 NOTE — Telephone Encounter (Signed)
Patient ask if you can call her as soon as possible, she stated that her back was hurting her so bad she can hardly stand, she need a prescription called into the pharmacy for her back but she can't remember the name of it , please advise

## 2015-04-19 NOTE — Telephone Encounter (Signed)
She can take 500 mg 3 times a day as needed, 30 tablets

## 2015-04-19 NOTE — Telephone Encounter (Signed)
Noted, rx sent, patient is aware.

## 2015-04-19 NOTE — Telephone Encounter (Signed)
Please see below, I can't find anything that you have prescribed.

## 2015-04-19 NOTE — Telephone Encounter (Signed)
Vicodin 5 mg, 1 every 8 hours as needed, 30 tablets no refills

## 2015-04-19 NOTE — Telephone Encounter (Signed)
She is requesting Robaxin instead of the Vicodin, she said she needs a muscle relaxant, not a pain pill, Please advise.

## 2015-04-20 ENCOUNTER — Telehealth: Payer: Self-pay | Admitting: Endocrinology

## 2015-04-20 NOTE — Telephone Encounter (Signed)
Patient called and would like for Suanne Marker to please call her   Thank you

## 2015-04-21 NOTE — Telephone Encounter (Signed)
[name] called this morning about the Robaxin, she called her pharmacist who told her that after a certain age medicare will no longer muscle relaxants. Pharmacist told her that she had a co-pay card which would reduce her out of pocket cost.  She now wants to know if you think its safe for her to take? She said her back feels a little bit better.  Please advise.

## 2015-04-21 NOTE — Telephone Encounter (Signed)
She could take it twice a day as needed

## 2015-04-21 NOTE — Telephone Encounter (Signed)
Noted, patient is aware.

## 2015-04-26 ENCOUNTER — Other Ambulatory Visit: Payer: Self-pay | Admitting: *Deleted

## 2015-04-26 ENCOUNTER — Encounter: Payer: Self-pay | Admitting: Endocrinology

## 2015-04-26 ENCOUNTER — Ambulatory Visit (INDEPENDENT_AMBULATORY_CARE_PROVIDER_SITE_OTHER): Payer: Medicare Other | Admitting: Endocrinology

## 2015-04-26 ENCOUNTER — Other Ambulatory Visit (INDEPENDENT_AMBULATORY_CARE_PROVIDER_SITE_OTHER): Payer: Medicare Other | Admitting: *Deleted

## 2015-04-26 VITALS — BP 158/74 | HR 94 | Temp 98.5°F | Resp 14 | Ht 63.0 in | Wt 195.2 lb

## 2015-04-26 DIAGNOSIS — E118 Type 2 diabetes mellitus with unspecified complications: Secondary | ICD-10-CM

## 2015-04-26 DIAGNOSIS — M545 Low back pain: Secondary | ICD-10-CM

## 2015-04-26 DIAGNOSIS — E1165 Type 2 diabetes mellitus with hyperglycemia: Secondary | ICD-10-CM | POA: Diagnosis not present

## 2015-04-26 DIAGNOSIS — N390 Urinary tract infection, site not specified: Secondary | ICD-10-CM | POA: Diagnosis not present

## 2015-04-26 DIAGNOSIS — N3000 Acute cystitis without hematuria: Secondary | ICD-10-CM | POA: Diagnosis not present

## 2015-04-26 LAB — POCT URINALYSIS DIPSTICK
Bilirubin, UA: NEGATIVE
GLUCOSE UA: NEGATIVE
KETONES UA: NEGATIVE
NITRITE UA: NEGATIVE
PH UA: 6
PROTEIN UA: NEGATIVE
Spec Grav, UA: 1.015
UROBILINOGEN UA: 0.2

## 2015-04-26 LAB — POCT GLYCOSYLATED HEMOGLOBIN (HGB A1C): Hemoglobin A1C: 9.6

## 2015-04-26 MED ORDER — INSULIN DEGLUDEC 200 UNIT/ML ~~LOC~~ SOPN
94.0000 [IU] | PEN_INJECTOR | Freq: Every day | SUBCUTANEOUS | Status: DC
Start: 2015-04-26 — End: 2015-06-08

## 2015-04-26 MED ORDER — VALSARTAN 160 MG PO TABS: 160.0000 mg | ORAL_TABLET | Freq: Every day | ORAL | Status: DC

## 2015-04-26 NOTE — Patient Instructions (Addendum)
Take at least 12 Novolog at supper  Tresiba 94 units in am and adjust every 4 days by 4 units to keep am sugar in 110-150 range  Take 2 Diovan daily

## 2015-04-26 NOTE — Addendum Note (Signed)
Addended by: Kaylyn Lim I on: 04/26/2015 10:32 AM   Modules accepted: Orders

## 2015-04-26 NOTE — Progress Notes (Addendum)
Patient ID: [name], female   DOB: 13-Oct-1935, 79 y.o.   MRN: [id number]   Reason for Appointment: follow-up of multiple problems  History of Present Illness    DIABETES type II since 1980   She has been on insulin using Lantus and Humalog for several years and usually A1c around 8% despite fairly good compliance with diet, exercise and glucose monitoring. She also had been on Actos previously which probably benefited her especially with her fatty liver but she stopped it because of fear of side effects      Recent history:  Her A1c has been consistently around 10%  She is taking significantly larger doses of Lantus in the morning over the last few months She was also recommended a trial of Invokana but she did not do this for fear of yeast infections Blood sugar is still not well controlled   She does have the following patterns:  Overnight/early morning readings are relatively higher recently although inconsistent; her glucose last night at 3 AM was 135 but higher this morning.  Does not have consistently high readings overnight  She does not increase her HUMALOG at suppertime even though she was advised to take at least 12-14 units because a high readings at supper  HIGHEST blood sugars are late at night and overall again, this is even with her taking Lantus at suppertime  Overall blood sugars are trending higher from morning to evening  No hypoglycemia and only occasionally blood sugar is low normal at breakfast or in the afternoon  She thinks her portions are relatively small  Still not able to do much exercise  She thinks her skin is very tough and does not want to try a pen  Insulin regimen: Lantus twice a day, 70--24 acs with syringe and Novolog 10 acb, acl; acs 10-12 Oral hypoglycemic drugs: none       Side effects from medications: None         Proper timing of medications in relation to meals: Yes.         Monitors blood glucose:  5 times a day    Glucometer:  Accu-Chek         Blood Glucose averages recently from meter download recently :  Mean values apply above for all meters except median for One Touch  PRE-MEAL Fasting  midday  Dinner Bedtime Overall  Glucose range: 68-225  79-293   119-276   152-299    Mean/median: 163 198 222 237 207    Meals: 3 meals per day.  Smaller portions; lunch 1-2 pm Supper 6 to 7 PM     Physical activity: exercise: Unable to do any because of dyspnea and fatigue           Dietician visit: Most recent: Several years ago           Complications: are: Peripheral neuropathy with sensory loss     Wt Readings from Last 3 Encounters:  04/26/15 195 lb 3.2 oz (88.542 kg)  04/08/15 193 lb 9.6 oz (87.816 kg)  02/24/15 198 lb 9.6 oz (90.084 kg)    Lab Results  Component Value Date   HGBA1C 9.6 04/26/2015   HGBA1C 9.9* 12/11/2014   HGBA1C 9.9* 10/14/2014   Lab Results  Component Value Date   MICROALBUR 6.9* 10/14/2014   LDLCALC 124* 12/16/2013   CREATININE 0.68 12/11/2014    OTHER problems  are detailed in review of systems     Medication List  This list is accurate as of: 04/26/15 10:19 AM.  Always use your most recent med list.               acetaminophen 500 MG tablet  Commonly known as:  TYLENOL  Take 500 mg by mouth every 6 (six) hours as needed for pain.     ALPRAZolam 0.5 MG tablet  Commonly known as:  XANAX  Take 1/2 tablet to 1 tablet two times a day     aspirin 325 MG tablet  Take 325 mg by mouth daily.     bacitracin ophthalmic ointment     BD INSULIN SYRINGE ULTRAFINE 31G X 15/64" 0.5 ML Misc  Generic drug:  Insulin Syringe-Needle U-100     benzonatate 200 MG capsule  Commonly known as:  TESSALON  Take 1 capsule (200 mg total) by mouth every 8 (eight) hours as needed for cough.     cholestyramine 4 G packet  Commonly known as:  QUESTRAN  Take one package up to 2times a day as needed for diarrhea.     ciprofloxacin 500 MG tablet  Commonly known as:   CIPRO  Take 1 tablet (500 mg total) by mouth 2 (two) times daily.     dicyclomine 10 MG capsule  Commonly known as:  BENTYL  Take 1 capsule by mouth once daily     diphenoxylate-atropine 2.5-0.025 MG tablet  Commonly known as:  LOMOTIL  Take 1 tablet by mouth daily.     escitalopram 10 MG tablet  Commonly known as:  LEXAPRO  Take 1 tablet (10 mg total) by mouth daily.     glucose blood test strip  Commonly known as:  ACCU-CHEK AVIVA PLUS  Use as instructed to check blood sugars 8 times per day dx code 250.02     Insulin Degludec 200 UNIT/ML Sopn  Commonly known as:  TRESIBA FLEXTOUCH  Inject 94 Units into the skin daily.     Insulin Glargine 100 UNIT/ML Solostar Pen  Commonly known as:  LANTUS SOLOSTAR  Inject 70 units every morning and 24 units in the evening.     Insulin Pen Needle 31G X 5 MM Misc  Use 5 per day to inject insulin     ipratropium 17 MCG/ACT inhaler  Commonly known as:  ATROVENT HFA  Inhale 2 puffs into the lungs 2 (two) times daily as needed (shortness of breath).     levalbuterol 45 MCG/ACT inhaler  Commonly known as:  XOPENEX HFA  Inhale 2 puffs into the lungs every 6 (six) hours as needed for wheezing or shortness of breath.     magic mouthwash Soln  Swish with 2 teaspoonfuls by mouth as needed     meclizine 25 MG tablet  Commonly known as:  ANTIVERT  Take 1 tablet (25 mg total) by mouth 3 (three) times daily as needed for dizziness.     methocarbamol 500 MG tablet  Commonly known as:  ROBAXIN  Take 1 tablet 3 times per day as needed for 10 days     metoprolol succinate 25 MG 24 hr tablet  Commonly known as:  TOPROL-XL  take 1 tablet by mouth once daily     NOVOLOG FLEXPEN 100 UNIT/ML FlexPen  Generic drug:  insulin aspart  INJECT 15 UNITS 3 TIMES A DAY     omeprazole 40 MG capsule  Commonly known as:  PRILOSEC  Take 1 capsule (40 mg total) by mouth daily.     omeprazole 20 MG capsule  Commonly known as:  PRILOSEC  take 1 capsule by  mouth twice a day before meals     pravastatin 20 MG tablet  Commonly known as:  PRAVACHOL  Take 1 tablet (20 mg total) by mouth daily.     predniSONE 10 MG tablet  Commonly known as:  DELTASONE  4 tablets x 1 day, 3 tablets x 1, 2 tablets x 1, 1 tablet x 1 day, 1/2 x 1 day, then STOP     Tiotropium Bromide-Olodaterol 2.5-2.5 MCG/ACT Aers  Commonly known as:  STIOLTO RESPIMAT  Inhale 2 puffs into the lungs daily.     trimethoprim 100 MG tablet  Commonly known as:  TRIMPEX     valsartan 160 MG tablet  Commonly known as:  DIOVAN  Take 1 tablet (160 mg total) by mouth daily.     VIGAMOX 0.5 % ophthalmic solution  Generic drug:  moxifloxacin        Allergies:  Allergies  Allergen Reactions  . Chlordiazepoxide-Clidinium Other (See Comments)    Sleepy,weak  . Biaxin [Clarithromycin]     Foul taste, abd pain, diarrhea  . Tramadol   . Codeine     REACTION: gi upset- only in high doses per pt.  Can tolerate cough syrup with codeine.     Past Medical History  Diagnosis Date  . Irritable bowel syndrome   . Other chronic nonalcoholic liver disease   . Esophageal reflux   . Allergic rhinitis, cause unspecified     Sinus CT Rec 12-23-2009  . 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 Sciatica   . Shingles 2010  . Steatohepatitis   . Diabetes mellitus   . Diverticulosis   . Hiatal hernia   . Esophageal stricture   . Diarrhea   . GERD (gastroesophageal reflux disease)   . Shortness of breath   . Hypertension   . Arthritis   . Glaucoma   . Complication of anesthesia     takes along time to wake up  . Oxygen deficiency   . Benign paroxysmal positional vertigo 08/09/2012  . Neuropathy in diabetes ([place]) 08/09/2012    Past Surgical History  Procedure Laterality Date  . Cholecystectomy open  1978  . Liver biopsy  08-1992  . Parathyroid exploration    . Tonsillectomy and adenoidectomy    . Total abdominal hysterectomy     . Esophagogastroduodenoscopy  3845,36-46    H Hernia,es.stricture s/p dil 48F  . Colonoscopy  07-2001    mild diverticulosis  . Carpal tunnel release      right hand  . Cataract extraction      Bilateral  . Ulnar nerve transposition  12/07/2011    Procedure: ULNAR NERVE DECOMPRESSION/TRANSPOSITION;this was cancelled-not done  Surgeon: Cammie Sickle., MD;  Location: [facility];  Service: Orthopedics;  Laterality: Right;  right ulnar nerve in situ decompression  . Ulnar tunnel release  03/07/2012    Procedure: CUBITAL TUNNEL RELEASE;  Surgeon: Roseanne Kaufman, MD;  Location: [facility];  Service: Orthopedics;  Laterality: Right;  ulnar nerve release at the elbow    . Appendectomy    . Eye surgery      Family History  Problem Relation Age of Onset  . Lung cancer Mother     small cell;Byssinosis  . Heart disease Father   . Lung cancer Sister   . Liver cancer Sister     ? mets  from another area of the body  . Diabetes      grandmother  . Stroke Maternal Grandfather     Social History:  reports that she quit smoking about 26 years ago. Her smoking use included Cigarettes. She has never used smokeless tobacco. She reports that she does not drink alcohol or use illicit drugs.  Review of Systems:  UTI: She is again having discomfort with urination and has had multiple courses of Cipro and Levaquin without benefit.  No recent causes are available and she has not followed up with urologist  LOW back pain, left hip pain: She is again having significant pain from scoliosis and other problems but has not considered going back to try steroid injection  HYPERTENSION:   blood pressure is relatively higher, she does not remember the readings at home Previously well-controlled with 80 mg Diovan.    She takes Xanax at night mostly and lorazepam in the daytime for anxiety  HYPERLIPIDEMIA:  The lipid abnormality consists of elevated LDL treated with pravastatin  20 mg only which she has no side effects from    Her last LDL is  below 100 Usually her triglycerides Are  relatively high   Lab Results  Component Value Date   CHOL 154 10/14/2014   HDL 42.90 10/14/2014   LDLCALC 124* 12/16/2013   LDLDIRECT 89.0 10/14/2014   TRIG 233.0* 10/14/2014   CHOLHDL 4 10/14/2014    She is followed by pulmonologist for chronic COPD, taking home oxygen and symptoms are cough, dyspnea and recurrent bronchitis, slightly better with new inhaler  Dizziness- still has occasional dizzy spells of unclear etiology   Diarrhea: She has had long-standing intermittent diarrhea possibly IBS. Is taking Questran as needed, may take this daily.  Occasionally takes Lomotil.    NEUROPATHY: She has numbness in her feet.  Also complaining of some tingling and numbness in her hands Foot exam done in 8/15: Foot exam shows sensory loss distally, normal pulses and no skin lesions   Examination:   BP 158/74 mmHg  Pulse 94  Temp(Src) 98.5 F (36.9 C)  Resp 14  Ht 5' 3"  (1.6 m)  Wt 195 lb 3.2 oz (88.542 kg)  BMI 34.59 kg/m2  SpO2 94%  Body mass index is 34.59 kg/(m^2).   Repeat blood pressure 154/76 No pedal edema  ASSESSMENT/ PLAN:   Diabetes type 2   The patient's diabetes control is  overall poor and requiring very large doses of insulin consistently A1c is 9.6 and home blood sugars are averaging over 200 See history of present illness for detailed discussion of his current management, blood sugar patterns and problems identified She finally agrees to try a more efficient insulin with Tyler Aas since Lantus is not effective for 24 hours and causes variability in blood sugars.  She will start with 94 units in the morning and discussed titration based on fasting blood sugars every 4 days She will need at least 2-4 units more at suppertime because of her higher readings in the evenings after supper  ?  UTI: Although her dipstick is abnormal and she complains of dysuria  will need to check her culture for further confirmation  Pain in back: she can go back to the orthopedic doctor for steroid injection  HYPERTENSION: Blood pressure is higher than usual and she will double her Diovan  Counseling time over 50% of today's 25 minute visit on above topics   Patient Instructions  Take at least 12 Novolog at supper  [place]  94 units in am and adjust every 4 days by 4 units to keep am sugar in 110-150 range  Take 2 Diovan daily     Xavius Spadafore 04/26/2015, 10:19 AM

## 2015-04-26 NOTE — Addendum Note (Signed)
Addended by: [name] on: 04/26/2015 10:30 AM   Modules accepted: Orders

## 2015-04-27 ENCOUNTER — Ambulatory Visit: Payer: Medicare Other | Admitting: Endocrinology

## 2015-04-27 LAB — URINE CULTURE

## 2015-04-28 ENCOUNTER — Other Ambulatory Visit: Payer: Self-pay | Admitting: *Deleted

## 2015-04-28 MED ORDER — PHENAZOPYRIDINE HCL 200 MG PO TABS: 200.0000 mg | ORAL_TABLET | Freq: Three times a day (TID) | ORAL | Status: DC | PRN

## 2015-05-13 ENCOUNTER — Telehealth: Payer: Self-pay | Admitting: Internal Medicine

## 2015-05-13 DIAGNOSIS — J449 Chronic obstructive pulmonary disease, unspecified: Secondary | ICD-10-CM

## 2015-05-13 NOTE — Telephone Encounter (Signed)
Ok to order shower chair- her DME company may be able to provide it

## 2015-05-13 NOTE — Telephone Encounter (Signed)
Spoke with pt. She is aware that we are going to place this order for her. Nothing further was needed.

## 2015-05-13 NOTE — Telephone Encounter (Signed)
Spoke with pt. She is requesting we write a prescription for a shower chair. Pt is having a hard time standing while bathing due to her shortness of breath.  CY - please advise if you are okay with ordering this. Thanks.

## 2015-05-19 ENCOUNTER — Other Ambulatory Visit: Payer: Self-pay | Admitting: Endocrinology

## 2015-05-24 ENCOUNTER — Ambulatory Visit (INDEPENDENT_AMBULATORY_CARE_PROVIDER_SITE_OTHER): Payer: Medicare Other

## 2015-05-24 ENCOUNTER — Telehealth: Payer: Self-pay | Admitting: Endocrinology

## 2015-05-24 ENCOUNTER — Ambulatory Visit (INDEPENDENT_AMBULATORY_CARE_PROVIDER_SITE_OTHER): Payer: Medicare Other | Admitting: Family Medicine

## 2015-05-24 VITALS — BP 144/62 | HR 72 | Temp 98.2°F | Resp 17 | Ht 63.0 in | Wt 194.0 lb

## 2015-05-24 DIAGNOSIS — K580 Irritable bowel syndrome with diarrhea: Secondary | ICD-10-CM

## 2015-05-24 DIAGNOSIS — K921 Melena: Secondary | ICD-10-CM

## 2015-05-24 DIAGNOSIS — I499 Cardiac arrhythmia, unspecified: Secondary | ICD-10-CM

## 2015-05-24 DIAGNOSIS — N390 Urinary tract infection, site not specified: Secondary | ICD-10-CM | POA: Diagnosis not present

## 2015-05-24 DIAGNOSIS — R8290 Unspecified abnormal findings in urine: Secondary | ICD-10-CM

## 2015-05-24 DIAGNOSIS — R8299 Other abnormal findings in urine: Secondary | ICD-10-CM | POA: Diagnosis not present

## 2015-05-24 DIAGNOSIS — R1084 Generalized abdominal pain: Secondary | ICD-10-CM | POA: Diagnosis not present

## 2015-05-24 LAB — POCT URINALYSIS DIP (MANUAL ENTRY)
BILIRUBIN UA: NEGATIVE
Bilirubin, UA: NEGATIVE
Glucose, UA: NEGATIVE
NITRITE UA: NEGATIVE
PH UA: 5.5
Spec Grav, UA: 1.02
UROBILINOGEN UA: 0.2

## 2015-05-24 LAB — POCT CBC
GRANULOCYTE PERCENT: 55.8 % (ref 37–80)
HCT, POC: 39.2 % (ref 37.7–47.9)
Hemoglobin: 12.9 g/dL (ref 12.2–16.2)
Lymph, poc: 3.6 — AB (ref 0.6–3.4)
MCH, POC: 27 pg (ref 27–31.2)
MCHC: 33.1 g/dL (ref 31.8–35.4)
MCV: 81.8 fL (ref 80–97)
MID (CBC): 0.6 (ref 0–0.9)
MPV: 7.4 fL (ref 0–99.8)
PLATELET COUNT, POC: 192 10*3/uL (ref 142–424)
POC Granulocyte: 5.2 (ref 2–6.9)
POC LYMPH %: 38.3 % (ref 10–50)
POC MID %: 5.9 %M (ref 0–12)
RBC: 4.79 M/uL (ref 4.04–5.48)
RDW, POC: 14.5 %
WBC: 9.4 10*3/uL (ref 4.6–10.2)

## 2015-05-24 LAB — COMPREHENSIVE METABOLIC PANEL
ALK PHOS: 44 U/L (ref 33–130)
ALT: 23 U/L (ref 6–29)
AST: 54 U/L — AB (ref 10–35)
Albumin: 4.1 g/dL (ref 3.6–5.1)
BILIRUBIN TOTAL: 0.3 mg/dL (ref 0.2–1.2)
BUN: 14 mg/dL (ref 7–25)
CALCIUM: 9.5 mg/dL (ref 8.6–10.4)
CO2: 31 mmol/L (ref 20–31)
CREATININE: 0.6 mg/dL (ref 0.60–0.93)
Chloride: 96 mmol/L — ABNORMAL LOW (ref 98–110)
GLUCOSE: 100 mg/dL — AB (ref 65–99)
Potassium: 4.2 mmol/L (ref 3.5–5.3)
SODIUM: 133 mmol/L — AB (ref 135–146)
Total Protein: 6.8 g/dL (ref 6.1–8.1)

## 2015-05-24 LAB — HEMOCCULT GUIAC POC 1CARD (OFFICE): Fecal Occult Blood, POC: NEGATIVE

## 2015-05-24 LAB — POC MICROSCOPIC URINALYSIS (UMFC): Mucus: ABSENT

## 2015-05-24 MED ORDER — AMOXICILLIN-POT CLAVULANATE 875-125 MG PO TABS: 1.0000 | ORAL_TABLET | Freq: Two times a day (BID) | ORAL | Status: DC

## 2015-05-24 NOTE — Telephone Encounter (Signed)
I left a message for her to call back.

## 2015-05-24 NOTE — Progress Notes (Addendum)
Subjective:    Patient ID: [name], female    DOB: 04-03-1936, 80 y.o.   MRN: [id number] By signing my name below, I, [name], attest that this documentation has been prepared under the direction and in the presence of Delman Cheadle, MD.  Electronically Signed: Zola [name], Medical Scribe. 05/24/2015. 2:29 PM.  Chief Complaint  Patient presents with  . Abdominal Pain    yesterday   . Diarrhea    yesterday  . Urinary Tract Infection    cloudy urine continues     HPI HPI Comments: [name] is a 80 y.o. female with a history of IBS - diarrheal predominate, cervical cancer (age 71), GERD, and DM who presents to the [facility] complaining abdominal pain that started 3 days ago. Patient states she has had diarrhea with black stools starting 3 days ago, up until yesterday. She reports having associated nausea as well. She often has diarrhea at baseline, but she notes that her diarrhea had not been bad the past 2-3 months. Patient typically has abdominal pain with the diarrhea, but the pain usually resolves when the diarrhea resolves; she still has abdominal pain which is unusual for her. She has been avoiding eating due to the diarrhea. She has tried Bentyl, which she only takes with more severe episodes of diarrhea, but without relief. She also took some Pepto-Bismol 3 days ago, but this was after she noticed black stools. Patient denies fever, chills, and vomiting. She notes that her blood sugar has been on the lower end recently; her blood sugar was 84 this morning. Her last colonoscopy was 5 years ago with Dr. [name] at [place]. Her PSHx includes cholecystectomy, appendectomy, and total hysterectomy.   Patient has uncontrolled diabetes, followed by Dr. Dwyane Dee. A1c last month was 9.6. She does occasionally receive steroid injections for back pain. At her endocrine visit last month, she was complaining of dysuria, though urine culture was negative, so patient was  started on pyridium. Patient states that she still has dysuria along with urinary frequency and cloudy urine. She notes that she has been on two courses of heavy antibiotics, and that she had been on antibiotics during her visit with endocrine last month.  Past Medical History  Diagnosis Date  . Irritable bowel syndrome   . Other chronic nonalcoholic liver disease   . Esophageal reflux   . Allergic rhinitis, cause unspecified     Sinus CT Rec 12-23-2009  . 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 Sciatica   . Shingles 2010  . Steatohepatitis   . Diabetes mellitus   . Diverticulosis   . Hiatal hernia   . Esophageal stricture   . Diarrhea   . GERD (gastroesophageal reflux disease)   . Shortness of breath   . Hypertension   . Arthritis   . Glaucoma   . Complication of anesthesia     takes along time to wake up  . Oxygen deficiency   . Benign paroxysmal positional vertigo 08/09/2012  . Neuropathy in diabetes ([place]) 08/09/2012    Past Surgical History  Procedure Laterality Date  . Cholecystectomy open  1978  . Liver biopsy  08-1992  . Parathyroid exploration    . Tonsillectomy and adenoidectomy    . Total abdominal hysterectomy    . Esophagogastroduodenoscopy  8242,35-36    H Hernia,es.stricture s/p dil 37F  . Colonoscopy  07-2001    mild diverticulosis  .  Carpal tunnel release      right hand  . Cataract extraction      Bilateral  . Ulnar nerve transposition  12/07/2011    Procedure: ULNAR NERVE DECOMPRESSION/TRANSPOSITION;this was cancelled-not done  Surgeon: Cammie Sickle., MD;  Location: [facility];  Service: Orthopedics;  Laterality: Right;  right ulnar nerve in situ decompression  . Ulnar tunnel release  03/07/2012    Procedure: CUBITAL TUNNEL RELEASE;  Surgeon: Roseanne Kaufman, MD;  Location: [facility];  Service: Orthopedics;  Laterality: Right;  ulnar nerve release at the elbow    .  Appendectomy    . Eye surgery      Allergies  Allergen Reactions  . Chlordiazepoxide-Clidinium Other (See Comments)    Sleepy,weak  . Biaxin [Clarithromycin]     Foul taste, abd pain, diarrhea  . Tramadol   . Codeine     REACTION: gi upset- only in high doses per pt.  Can tolerate cough syrup with codeine.    Current Outpatient Prescriptions on File Prior to Visit  Medication Sig Dispense Refill  . acetaminophen (TYLENOL) 500 MG tablet Take 500 mg by mouth every 6 (six) hours as needed for pain.     Marland Kitchen ALPRAZolam (XANAX) 0.5 MG tablet Take 1/2 tablet to 1 tablet two times a day 60 tablet 5  . aspirin 325 MG tablet Take 325 mg by mouth daily.      . bacitracin ophthalmic ointment   0  . BD INSULIN SYRINGE ULTRAFINE 31G X 15/64" 0.5 ML MISC   1  . cholestyramine (QUESTRAN) 4 G packet Take one package up to 2times a day as needed for diarrhea. 60 each 1  . dicyclomine (BENTYL) 10 MG capsule Take 1 capsule by mouth once daily 30 capsule 5  . diphenoxylate-atropine (LOMOTIL) 2.5-0.025 MG per tablet Take 1 tablet by mouth daily. 5 tablet 0  . glucose blood (ACCU-CHEK AVIVA PLUS) test strip Use as instructed to check blood sugars 8 times per day dx code 250.02 250 each 3  . Insulin Glargine (LANTUS SOLOSTAR) 100 UNIT/ML Solostar Pen Inject 70 units every morning and 24 units in the evening. 10 pen 3  . Insulin Pen Needle 31G X 5 MM MISC Use 5 per day to inject insulin 150 each 5  . levalbuterol (XOPENEX HFA) 45 MCG/ACT inhaler Inhale 2 puffs into the lungs every 6 (six) hours as needed for wheezing or shortness of breath. 1 Inhaler 11  . magic mouthwash SOLN Swish with 2 teaspoonfuls by mouth as needed 240 mL 3  . meclizine (ANTIVERT) 25 MG tablet Take 1 tablet (25 mg total) by mouth 3 (three) times daily as needed for dizziness. 90 tablet 3  . methocarbamol (ROBAXIN) 500 MG tablet Take 1 tablet 3 times per day as needed for 10 days 30 tablet 0  . metoprolol succinate (TOPROL-XL) 25 MG 24 hr  tablet take 1 tablet by mouth once daily 30 tablet 5  . NOVOLOG FLEXPEN 100 UNIT/ML FlexPen INJECT 15 UNITS 3 TIMES A DAY 15 mL 3  . omeprazole (PRILOSEC) 40 MG capsule Take 1 capsule (40 mg total) by mouth daily. 30 capsule 2  . pravastatin (PRAVACHOL) 20 MG tablet take 1 tablet by mouth once daily 30 tablet 5  . Tiotropium Bromide-Olodaterol (STIOLTO RESPIMAT) 2.5-2.5 MCG/ACT AERS Inhale 2 puffs into the lungs daily. 1 Inhaler 11  . trimethoprim (TRIMPEX) 100 MG tablet   1  . valsartan (DIOVAN) 160 MG tablet Take  1 tablet (160 mg total) by mouth daily. 30 tablet 5  . VIGAMOX 0.5 % ophthalmic solution     . benzonatate (TESSALON) 200 MG capsule Take 1 capsule (200 mg total) by mouth every 8 (eight) hours as needed for cough. (Patient not taking: Reported on 05/24/2015) 30 capsule 1  . escitalopram (LEXAPRO) 10 MG tablet Take 1 tablet (10 mg total) by mouth daily. (Patient not taking: Reported on 05/24/2015) 30 tablet 2  . Insulin Degludec (TRESIBA FLEXTOUCH) 200 UNIT/ML SOPN Inject 94 Units into the skin daily. (Patient not taking: Reported on 05/24/2015) 6 pen 3  . ipratropium (ATROVENT HFA) 17 MCG/ACT inhaler Inhale 2 puffs into the lungs 2 (two) times daily as needed (shortness of breath). (Patient not taking: Reported on 05/24/2015) 1 Inhaler 11  . phenazopyridine (PYRIDIUM) 200 MG tablet Take 1 tablet (200 mg total) by mouth 3 (three) times daily as needed for pain. (Patient not taking: Reported on 05/24/2015) 30 tablet 0  . predniSONE (DELTASONE) 10 MG tablet 4 tablets x 1 day, 3 tablets x 1, 2 tablets x 1, 1 tablet x 1 day, 1/2 x 1 day, then STOP (Patient not taking: Reported on 05/24/2015) 11 tablet 0   No current facility-administered medications on file prior to visit.   Depression screen [facility] 2/9 05/24/2015 04/08/2015 07/03/2014  Decreased Interest 0 0 0  Down, Depressed, Hopeless 0 0 0  PHQ - 2 Score 0 0 0     Review of Systems  Constitutional: Positive for diaphoresis, appetite change  and fatigue. Negative for fever, chills, activity change and unexpected weight change.  Gastrointestinal: Positive for nausea, abdominal pain, diarrhea, blood in stool and abdominal distention. Negative for vomiting, constipation, anal bleeding and rectal pain.  Endocrine: Positive for polyuria. Negative for polydipsia and polyphagia.  Genitourinary: Positive for dysuria, frequency, flank pain, difficulty urinating and pelvic pain. Negative for hematuria, decreased urine volume, vaginal bleeding, vaginal discharge, enuresis, genital sores and vaginal pain.  Allergic/Immunologic: Positive for environmental allergies. Negative for immunocompromised state.  Neurological: Negative for weakness.  Hematological: Negative for adenopathy.  Psychiatric/Behavioral: Positive for sleep disturbance. Negative for dysphoric mood. The patient is not nervous/anxious.        Objective:  BP 144/62 mmHg  Pulse 72  Temp(Src) 98.2 F (36.8 C) (Oral)  Resp 17  Ht 5' 3"  (1.6 m)  Wt 194 lb (87.998 kg)  BMI 34.37 kg/m2  SpO2 94%  Physical Exam  Constitutional: She is oriented to person, place, and time. She appears well-developed and well-nourished. No distress.  HENT:  Head: Normocephalic and atraumatic.  Mouth/Throat: Oropharynx is clear and moist. No oropharyngeal exudate.  Eyes: Pupils are equal, round, and reactive to light.  Neck: Neck supple.  Cardiovascular: Normal rate.  An irregular rhythm present.  Pulmonary/Chest: Effort normal. She has rales.  Decreased air movement. Inspiratory rales.  Abdominal: There is tenderness in the right upper quadrant and left lower quadrant.  Tympanitic bowel sounds. TTP over the RUQ and LLQ.  Genitourinary:  Rectal exam normal. Tiny amount of darkened stool in vault.  Musculoskeletal: She exhibits no edema.  Neurological: She is alert and oriented to person, place, and time. No cranial nerve deficit.  Skin: Skin is warm and dry. No rash noted.  Psychiatric: She  has a normal mood and affect. Her behavior is normal.  Nursing note and vitals reviewed.  [facility] (PRIMARY) x-ray report read by Dr. Brigitte Pulse: Aortic atherosclerosis. No effusion. No air fluid levels. Moderate stool volume. No abnormal  gas pattern. Cholecystectomy clips seen.  EKG - bradycardic with RBBB.     Results for orders placed or performed in visit on 05/24/15  POCT Microscopic Urinalysis ([facility])  Result Value Ref Range   WBC,UR,HPF,POC Too numerous to count  (A) None WBC/hpf   RBC,UR,HPF,POC Few (A) None RBC/hpf   Bacteria Few (A) None, Too numerous to count   Mucus Absent Absent   Epithelial Cells, UR Per Microscopy Few (A) None, Too numerous to count cells/hpf  POCT urinalysis dipstick  Result Value Ref Range   Color, UA yellow yellow   Clarity, UA cloudy (A) clear   Glucose, UA negative negative   Bilirubin, UA negative negative   Ketones, POC UA negative negative   Spec Grav, UA 1.020    Blood, UA moderate (A) negative   pH, UA 5.5    Protein Ur, POC trace (A) negative   Urobilinogen, UA 0.2    Nitrite, UA Negative Negative   Leukocytes, UA moderate (2+) (A) Negative  POCT CBC  Result Value Ref Range   WBC 9.4 4.6 - 10.2 K/uL   Lymph, poc 3.6 (A) 0.6 - 3.4   POC LYMPH PERCENT 38.3 10 - 50 %L   MID (cbc) 0.6 0 - 0.9   POC MID % 5.9 0 - 12 %M   POC Granulocyte 5.2 2 - 6.9   Granulocyte percent 55.8 37 - 80 %G   RBC 4.79 4.04 - 5.48 M/uL   Hemoglobin 12.9 12.2 - 16.2 g/dL   HCT, POC 39.2 37.7 - 47.9 %   MCV 81.8 80 - 97 fL   MCH, POC 27.0 27 - 31.2 pg   MCHC 33.1 31.8 - 35.4 g/dL   RDW, POC 14.5 %   Platelet Count, POC 192 142 - 424 K/uL   MPV 7.4 0 - 99.8 fL  POCT occult blood stool  Result Value Ref Range   Fecal Occult Blood, POC Negative Negative   Card #1 Date     Card #2 Fecal Occult Blod, POC     Card #2 Date     Card #3 Fecal Occult Blood, POC     Card #3 Date      Assessment & Plan:    1. Cloudy urine   2. Generalized abdominal pain   3.  Melena   4. Cardiac arrhythmia, unspecified cardiac arrhythmia type - suspect pt was having pvcs when I was listening to her, ekg reassuring sinus bradycardia rate 58-60.  5. UTI (lower urinary tract infection) - Augmentin, clx P.  6. Irritable bowel syndrome with diarrhea   Suspect pt does have  UTI. Pt reports she gets recurrent back-to-back UTIs despite extensive w/u with her urologist Dr. Alinda Money who she sees annually and has her on trimethoprim 100 qhs which she is still taking.  Epic does not show any + cultures other than 2 yrs prior - enterobacter resistant to cefazolin, ceftriaxone, and zosyn.  Last mo, her clx was neg after she was having dysuria but sxs somewhat improved but did not resolve after 5d of pyridium. I do not know if pt is getting breakthrough UTIs or if she could be experiencing other bladder irritation causing sxs.  Rec f/u with urology for complaints of recurrent UTIs on suppression antibiotics.  Pt does have a h/o IBS-D in her chart so I assume her recent abd pain and diarrhea are do to this. She is taking bentyl 10 qd but advised she can increase this to qid prn.  Suspect stools looked black due to use of peptobismol and diet changes. FOBT and cbc reassuring.  Xray nml.  Advised trial of probiotics into her diet.  Her diarrhea has actually resolved for >48 hours.  She has been seen at [facility] prior so if sxs cont rec RTC for further evaluation and refer back to GI as needed.  Orders Placed This Encounter  Procedures  . Urine culture  . DG Abd Acute W/Chest    Order Specific Question:  Reason for exam:    Answer:  concern for obstructions, tender with black diarrhea x 4d, improving    Order Specific Question:  Preferred imaging location?    Answer:  External  . Comprehensive metabolic panel  . POCT Microscopic Urinalysis ([facility])  . POCT urinalysis dipstick  . POCT CBC  . POCT occult blood stool  . EKG 12-Lead    Meds ordered this encounter  Medications  .  amoxicillin-clavulanate (AUGMENTIN) 875-125 MG tablet    Sig: Take 1 tablet by mouth 2 (two) times daily.    Dispense:  14 tablet    Refill:  0   Over 40 min spent in face-to-face evaluation of and consultation with patient and coordination of care.  Over 50% of this time was spent counseling this patient.  I personally performed the services described in this documentation, which was scribed in my presence. The recorded information has been reviewed and considered, and addended by me as needed.  Delman Cheadle, MD MPH

## 2015-05-24 NOTE — Patient Instructions (Signed)
Diet for Irritable Bowel Syndrome When you have irritable bowel syndrome (IBS), the foods you eat and your eating habits are very important. IBS may cause various symptoms, such as abdominal pain, constipation, or diarrhea. Choosing the right foods can help ease discomfort caused by these symptoms. Work with your health care provider and dietitian to find the best eating plan to help control your symptoms. WHAT GENERAL GUIDELINES DO I NEED TO FOLLOW?  Keep a food diary. This will help you identify foods that cause symptoms. Write down:  What you eat and when.  What symptoms you have.  When symptoms occur in relation to your meals.  Avoid foods that cause symptoms. Talk with your dietitian about other ways to get the same nutrients that are in these foods.  Eat more foods that contain fiber. Take a fiber supplement if directed by your dietitian.  Eat your meals slowly, in a relaxed setting.  Aim to eat 5-6 small meals per day. Do not skip meals.  Drink enough fluids to keep your urine clear or pale yellow.  Ask your health care provider if you should take an over-the-counter probiotic during flare-ups to help restore healthy gut bacteria.  If you have cramping or diarrhea, try making your meals low in fat and high in carbohydrates. Examples of carbohydrates are pasta, rice, whole grain breads and cereals, fruits, and vegetables.  If dairy products cause your symptoms to flare up, try eating less of them. You might be able to handle yogurt better than other dairy products because it contains bacteria that help with digestion. WHAT FOODS ARE NOT RECOMMENDED? The following are some foods and drinks that may worsen your symptoms:  Fatty foods, such as [place] fries.  Milk products, such as cheese or ice cream.  Chocolate.  Alcohol.  Products with caffeine, such as coffee.  Carbonated drinks, such as soda. The items listed above may not be a complete list of foods and beverages to  avoid. Contact your dietitian for more information. WHAT FOODS ARE GOOD SOURCES OF FIBER? Your health care provider or dietitian may recommend that you eat more foods that contain fiber. Fiber can help reduce constipation and other IBS symptoms. Add foods with fiber to your diet a little at a time so that your body can get used to them. Too much fiber at once might cause gas and swelling of your abdomen. The following are some foods that are good sources of fiber:  Apples.  Peaches.  Pears.  Berries.  Figs.  Broccoli (raw).  Cabbage.  Carrots.  Raw peas.  Kidney beans.  Lima beans.  Whole grain bread.  Whole grain cereal. FOR MORE INFORMATION  International Foundation for Functional Gastrointestinal Disorders: www.iffgd.[employer] Diabetes and Digestive and Kidney [employer]: NetworkAffair.co.za.aspx   This information is not intended to replace advice given to you by your health care provider. Make sure you discuss any questions you have with your health care provider.   Document Released: 07/15/2003 Document Revised: 05/15/2014 Document Reviewed: 07/25/2013 Elsevier Interactive Patient Education [employer].

## 2015-05-24 NOTE — Telephone Encounter (Signed)
Patient stated that she is in a lot of pain and would like for you to call her she will explain everything to you.

## 2015-05-25 ENCOUNTER — Encounter: Payer: Self-pay | Admitting: Family Medicine

## 2015-05-26 ENCOUNTER — Other Ambulatory Visit: Payer: Self-pay | Admitting: Endocrinology

## 2015-05-26 LAB — URINE CULTURE

## 2015-05-31 ENCOUNTER — Telehealth: Payer: Self-pay

## 2015-05-31 DIAGNOSIS — N3090 Cystitis, unspecified without hematuria: Secondary | ICD-10-CM

## 2015-05-31 MED ORDER — NITROFURANTOIN MONOHYD MACRO 100 MG PO CAPS
100.0000 mg | ORAL_CAPSULE | Freq: Two times a day (BID) | ORAL | Status: AC
Start: 2015-05-31 — End: 2015-06-07

## 2015-05-31 NOTE — Telephone Encounter (Signed)
Pt advised Rx sent to pharmacy. Pt understood.

## 2015-05-31 NOTE — Telephone Encounter (Signed)
Pt is stating that she spoke with her urologist and he told her the augmenton would not help and that we needed to call in   Either doxycyclyine or nitrosurantoin pt saw [facility]   Best number [phone]

## 2015-05-31 NOTE — Telephone Encounter (Signed)
Ok. She should stop the augmentin. I have sent macrobid to her pharmacy. Take twice a day for 7 days. Her urine culture reflected that macrobid would be a good antibiotic for her to take.

## 2015-06-08 ENCOUNTER — Telehealth: Payer: Self-pay | Admitting: *Deleted

## 2015-06-08 ENCOUNTER — Ambulatory Visit (INDEPENDENT_AMBULATORY_CARE_PROVIDER_SITE_OTHER): Payer: Medicare Other | Admitting: Endocrinology

## 2015-06-08 ENCOUNTER — Encounter: Payer: Self-pay | Admitting: Endocrinology

## 2015-06-08 VITALS — BP 134/84 | HR 91 | Temp 98.1°F | Resp 16 | Ht 63.0 in | Wt 195.6 lb

## 2015-06-08 DIAGNOSIS — Z794 Long term (current) use of insulin: Secondary | ICD-10-CM

## 2015-06-08 DIAGNOSIS — E1165 Type 2 diabetes mellitus with hyperglycemia: Secondary | ICD-10-CM | POA: Diagnosis not present

## 2015-06-08 MED ORDER — INSULIN DEGLUDEC 200 UNIT/ML ~~LOC~~ SOPN: 94.0000 [IU] | PEN_INJECTOR | Freq: Every day | SUBCUTANEOUS | Status: DC

## 2015-06-08 NOTE — Telephone Encounter (Signed)
The insulin does not cause low potassium, she is not reading the right information and she never has had low potassium.  She needs to take this in order to get better sugar control

## 2015-06-08 NOTE — Telephone Encounter (Signed)
Mrs. Soldo called,  She wants you to know she started reading about the [place] and how you shouldn't take it if you have low potassium as it can cause problems, she wanted you to know that she wakes up at night with her leg cramping severely, she's not sure if it's due to low potassium or her scoliosis.  Please advise if it's safe for her to take.

## 2015-06-08 NOTE — Patient Instructions (Signed)
TRESIBA 86 units in am and if suagr <100 in am reduce by 10 units  May go up to 90 if am  Sugar > 150  Take at leat 10-14 units Novolog at supper and 10-12 other meals

## 2015-06-08 NOTE — Progress Notes (Signed)
Patient ID: [name], female   DOB: 25-Feb-1936, 80 y.o.   MRN: [id number]   Reason for Appointment: follow-up of multiple problems  History of Present Illness    DIABETES type II since 1980   She has been on insulin using Lantus and Humalog for several years and usually A1c around 8% despite fairly good compliance with diet, exercise and glucose monitoring. She also had been on Actos previously which probably benefited her especially with her fatty liver but she stopped it because of fear of side effects      Recent history:   Insulin regimen: Lantus twice a day, 70--24 acs with syringe and Novolog 10 acb, acl; acs 8-10  Her A1c has been consistently around 9- 10%  She is taking significantly larger doses of Lantus in the morning over the last few months She was also recommended a trial of [place] but she did not want to try this as she is afraid of side effects from any new drug She says she is taking LANTUS with a syringe because her skin is hard and cannot use the pen needle easily Blood sugar is still not well controlled although fasting blood sugars are relatively better  She does have the following patterns:  Overnight/early morning readings are improving recently but she still has high readings during the night even without any extra snacks  She does not like to take enough Novolog simply because she does not think she is eating enough but he has been told that blood sugars are higher after meals regardless  Blood sugars are gradually tending to be higher from morning to night and highest at bedtime usually as before  Her weight is stable   No hypoglycemia , lowest reading 70 although she tends to get symptoms with low normal sugars   Still not able to do much physical activity but her weight is stable  Oral hypoglycemic drugs: none       Side effects from medications: None         Proper timing of medications in relation to meals: Yes.         Monitors  blood glucose:  5 times a day   Glucometer:  Accu-Chek         Blood Glucose averages recently from meter download recently :  Mean values apply above for all meters except median for One Touch  PRE-MEAL Fasting Lunch Dinner Bedtime Overall  Glucose range:  96-237   173-261   151-267   161-315    Mean/median:  170   190   210   236   203+/-53    Meals: 3 meals per day.  Smaller portions; lunch 1-2 pm Supper 6 to 7 PM     Physical activity: exercise: Unable to do any because of dyspnea and fatigue           Dietician visit: Most recent: Several years ago           Complications: are: Peripheral neuropathy with sensory loss     Wt Readings from Last 3 Encounters:  06/08/15 195 lb 9.6 oz (88.724 kg)  05/24/15 194 lb (87.998 kg)  04/26/15 195 lb 3.2 oz (88.542 kg)    Lab Results  Component Value Date   HGBA1C 9.6 04/26/2015   HGBA1C 9.9* 12/11/2014   HGBA1C 9.9* 10/14/2014   Lab Results  Component Value Date   MICROALBUR 6.9* 10/14/2014   LDLCALC 124* 12/16/2013   CREATININE 0.60 05/24/2015  OTHER problems  are detailed in review of systems     Medication List       This list is accurate as of: 06/08/15  8:47 PM.  Always use your most recent med list.               acetaminophen 500 MG tablet  Commonly known as:  TYLENOL  Take 500 mg by mouth every 6 (six) hours as needed for pain.     ALPRAZolam 0.5 MG tablet  Commonly known as:  XANAX  Take 1/2 tablet to 1 tablet two times a day     aspirin 325 MG tablet  Take 325 mg by mouth daily.     B-D UF III MINI PEN NEEDLES 31G X 5 MM Misc  Generic drug:  Insulin Pen Needle  USE 5 PER DAY TO INJECT INSULIN     bacitracin ophthalmic ointment     BD INSULIN SYRINGE ULTRAFINE 31G X 15/64" 0.5 ML Misc  Generic drug:  Insulin Syringe-Needle U-100     cholestyramine 4 g packet  Commonly known as:  QUESTRAN  Take one package up to 2times a day as needed for diarrhea.     dicyclomine 10 MG capsule  Commonly known  as:  BENTYL  take 1 capsule by mouth once daily     diphenoxylate-atropine 2.5-0.025 MG tablet  Commonly known as:  LOMOTIL  Take 1 tablet by mouth daily.     glucose blood test strip  Commonly known as:  ACCU-CHEK AVIVA PLUS  Use as instructed to check blood sugars 8 times per day dx code 250.02     ipratropium 17 MCG/ACT inhaler  Commonly known as:  ATROVENT HFA  Inhale 2 puffs into the lungs 2 (two) times daily as needed (shortness of breath).     insulin glargine 100 UNIT/ML injection  Commonly known as:  LANTUS  Inject into the skin at bedtime. Inject 70 units in am and 24 units in pm     LANTUS 100 UNIT/ML injection  Generic drug:  insulin glargine  inject 64 units subcutaneously and 24 units at bedtime     levalbuterol 45 MCG/ACT inhaler  Commonly known as:  XOPENEX HFA  Inhale 2 puffs into the lungs every 6 (six) hours as needed for wheezing or shortness of breath.     magic mouthwash Soln  Swish with 2 teaspoonfuls by mouth as needed     meclizine 25 MG tablet  Commonly known as:  ANTIVERT  take 1 tablet by mouth three times a day if needed for dizziness     methocarbamol 500 MG tablet  Commonly known as:  ROBAXIN  Take 1 tablet 3 times per day as needed for 10 days     metoprolol succinate 25 MG 24 hr tablet  Commonly known as:  TOPROL-XL  take 1 tablet by mouth once daily     NOVOLOG FLEXPEN 100 UNIT/ML FlexPen  Generic drug:  insulin aspart  INJECT 15 UNITS 3 TIMES A DAY     omeprazole 40 MG capsule  Commonly known as:  PRILOSEC  Take 1 capsule (40 mg total) by mouth daily.     pravastatin 20 MG tablet  Commonly known as:  PRAVACHOL  take 1 tablet by mouth once daily     Tiotropium Bromide-Olodaterol 2.5-2.5 MCG/ACT Aers  Commonly known as:  STIOLTO RESPIMAT  Inhale 2 puffs into the lungs daily.     trimethoprim 100 MG tablet  Commonly known as:  TRIMPEX  Reported on 06/08/2015     valsartan 160 MG tablet  Commonly known as:  DIOVAN  Take 1  tablet (160 mg total) by mouth daily.     VIGAMOX 0.5 % ophthalmic solution  Generic drug:  moxifloxacin  Reported on 06/08/2015        Allergies:  Allergies  Allergen Reactions  . Chlordiazepoxide-Clidinium Other (See Comments)    Sleepy,weak  . Biaxin [Clarithromycin]     Foul taste, abd pain, diarrhea  . Tramadol   . Codeine     REACTION: gi upset- only in high doses per pt.  Can tolerate cough syrup with codeine.     Past Medical History  Diagnosis Date  . Irritable bowel syndrome   . Other chronic nonalcoholic liver disease   . Esophageal reflux   . Allergic rhinitis, cause unspecified     Sinus CT Rec 12-23-2009  . 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 Sciatica   . Shingles 2010  . Steatohepatitis   . Diabetes mellitus   . Diverticulosis   . Hiatal hernia   . Esophageal stricture   . Diarrhea   . GERD (gastroesophageal reflux disease)   . Shortness of breath   . Hypertension   . Arthritis   . Glaucoma   . Complication of anesthesia     takes along time to wake up  . Oxygen deficiency   . Benign paroxysmal positional vertigo 08/09/2012  . Neuropathy in diabetes ([facility]) 08/09/2012    Past Surgical History  Procedure Laterality Date  . Cholecystectomy open  1978  . Liver biopsy  08-1992  . Parathyroid exploration    . Tonsillectomy and adenoidectomy    . Total abdominal hysterectomy    . Esophagogastroduodenoscopy  5701,77-93    H Hernia,es.stricture s/p dil 3F  . Colonoscopy  07-2001    mild diverticulosis  . Carpal tunnel release      right hand  . Cataract extraction      Bilateral  . Ulnar nerve transposition  12/07/2011    Procedure: ULNAR NERVE DECOMPRESSION/TRANSPOSITION;this was cancelled-not done  Surgeon: Cammie Sickle., MD;  Location: [facility];  Service: Orthopedics;  Laterality: Right;  right ulnar nerve in situ decompression  . Ulnar tunnel release  03/07/2012     Procedure: CUBITAL TUNNEL RELEASE;  Surgeon: Roseanne Kaufman, MD;  Location: [facility];  Service: Orthopedics;  Laterality: Right;  ulnar nerve release at the elbow    . Appendectomy    . Eye surgery      Family History  Problem Relation Age of Onset  . Lung cancer Mother     small cell;Byssinosis  . Heart disease Father   . Lung cancer Sister   . Liver cancer Sister     ? mets from another area of the body  . Diabetes      grandmother  . Stroke Maternal Grandfather     Social History:  reports that she quit smoking about 27 years ago. Her smoking use included Cigarettes. She has never used smokeless tobacco. She reports that she does not drink alcohol or use illicit drugs.  Review of Systems:  UTI: She was given Macrodantin by urologist, recently had a staph infection in the urine which was resistant to the trimethoprim which she was taking for prophylaxis  LOW back pain, left hip pain: She is  having significant pain from scoliosis and other  problems but still hesitating about going to the pain clinic and asking about it.  Also asking about amitriptyline  HYPERTENSION:   blood pressure is better now with 160 mg Diovan increased on the last visit  She takes Xanax at night mostly and lorazepam in the daytime for anxiety  HYPERLIPIDEMIA:  The lipid abnormality consists of elevated LDL treated with pravastatin 20 mg only which she has no side effects from    Her last LDL is  below 100 Usually her triglycerides Are  relatively high   Lab Results  Component Value Date   CHOL 154 10/14/2014   HDL 42.90 10/14/2014   LDLCALC 124* 12/16/2013   LDLDIRECT 89.0 10/14/2014   TRIG 233.0* 10/14/2014   CHOLHDL 4 10/14/2014    She is followed by pulmonologist for chronic COPD, taking home oxygen and symptoms are cough, dyspnea and recurrent bronchitis, slightly better with new inhaler  Dizziness- still has occasional dizzy spells of unclear etiology   Diarrhea: She  has had long-standing intermittent diarrhea possibly IBS. Is taking Questran as needed, may take this daily.  Occasionally takes Lomotil.  Was seen in urgent care center but no specific diagnosis made     NEUROPATHY: She has numbness in her feet.  Also complaining of some tingling and numbness in her hands Foot exam done in 8/15: Foot exam shows sensory loss distally, normal pulses and no skin lesions   Examination:   BP 134/84 mmHg  Pulse 91  Temp(Src) 98.1 F (36.7 C)  Resp 16  Ht 5' 3"  (1.6 m)  Wt 195 lb 9.6 oz (88.724 kg)  BMI 34.66 kg/m2  SpO2 93%  Body mass index is 34.66 kg/(m^2).   No pedal edema  ASSESSMENT/ PLAN:   Diabetes type 2  See history of present illness for detailed discussion of his current management, blood sugar patterns and problems identified  The patient's diabetes control is  overall poor and requiring very large doses of insulin Currently taking in adequate amounts of insulin with progressively high readings throughout the day  Explained to her that she is taking large amount of basal but not enough Novolog relatively compared to basal despite her reported small intake of carbohydrates She finally agrees to do a trial of [place] and use the insulin pen for this Start with 90 units Discussed differences between this and Lantus and dosage regimen She will titrate the dose every 3 days if fasting blood sugars are outside the target of 100-150  She will need at least 2-4 units more  Novolog at suppertime because of her higher readings in the evenings after supper, Also at least 2 more units at breakfast and lunch   Pain in back: she can goto the pain clinic  HYPERTENSION:  improved control  Hyperlipidemia: Needs follow-up levels  Counseling time over 50% of today's 25 minute visit on above topics   Patient Instructions  TRESIBA 86 units in am and if suagr <100 in am reduce by 10 units  May go up to 90 if am  Sugar > 150  Take at leat 10-14  units Novolog at supper and 10-12 other meals         Anny Sayler 06/08/2015, 8:47 PM       k

## 2015-06-09 NOTE — Telephone Encounter (Signed)
She needs to see GI for these recurring issues, need referral?

## 2015-06-09 NOTE — Telephone Encounter (Signed)
She said she does not want to start taking it until Monday, she said she's had very bad stomach pain, and Diarrhea.

## 2015-06-10 ENCOUNTER — Telehealth: Payer: Self-pay | Admitting: Endocrinology

## 2015-06-10 NOTE — Telephone Encounter (Signed)
She can try drinking tonic water at least at bedtime Also can try OTC magnesium tablets twice a day She needs to do stretching of her leg and calf muscles before bedtime This is not a contraindication to switching to [place]

## 2015-06-10 NOTE — Telephone Encounter (Signed)
Please see below, do you have any advice?

## 2015-06-10 NOTE — Telephone Encounter (Signed)
Noted, she is aware.  She does not want to see G.I again, she said they don't do anything to help her.

## 2015-06-10 NOTE — Telephone Encounter (Signed)
Patient called stating that she is still having having leg cramps  Please advise    Thank you

## 2015-06-25 ENCOUNTER — Telehealth: Payer: Self-pay | Admitting: Internal Medicine

## 2015-06-25 MED ORDER — AMOXICILLIN 500 MG PO CAPS: 500.0000 mg | ORAL_CAPSULE | Freq: Two times a day (BID) | ORAL | Status: DC

## 2015-06-25 MED ORDER — BENZONATATE 200 MG PO CAPS: 200.0000 mg | ORAL_CAPSULE | Freq: Three times a day (TID) | ORAL | Status: DC | PRN

## 2015-06-25 NOTE — Telephone Encounter (Signed)
Spoke with pt, c/o cough X2-3 weeks.  Cough is productive with green/yellow mucus, runny nose, PND. Denies chest pain, fever, chills.   Taking tessalon perles (is now out of these).  Pt requesting refill on this as well as further recs.   Pt also notes she had a cxr last month at urgent care, available in Epic if CY needs to look at this.   Pt uses [facility] on [address].    Last ov: 01/12/15 Next ov: 07/08/15  CY please advise on recs.  Thanks!   Allergies  Allergen Reactions  . Chlordiazepoxide-Clidinium Other (See Comments)    Sleepy,weak  . Biaxin [Clarithromycin]     Foul taste, abd pain, diarrhea  . Tramadol   . Codeine     REACTION: gi upset- only in high doses per pt.  Can tolerate cough syrup with codeine.    Current Outpatient Prescriptions on File Prior to Visit  Medication Sig Dispense Refill  . acetaminophen (TYLENOL) 500 MG tablet Take 500 mg by mouth every 6 (six) hours as needed for pain.     Marland Kitchen ALPRAZolam (XANAX) 0.5 MG tablet Take 1/2 tablet to 1 tablet two times a day 60 tablet 5  . aspirin 325 MG tablet Take 325 mg by mouth daily.      . B-D UF III MINI PEN NEEDLES 31G X 5 MM MISC USE 5 PER DAY TO INJECT INSULIN  0  . bacitracin ophthalmic ointment   0  . BD INSULIN SYRINGE ULTRAFINE 31G X 15/64" 0.5 ML MISC   1  . cholestyramine (QUESTRAN) 4 G packet Take one package up to 2times a day as needed for diarrhea. 60 each 1  . dicyclomine (BENTYL) 10 MG capsule take 1 capsule by mouth once daily 30 capsule 5  . diphenoxylate-atropine (LOMOTIL) 2.5-0.025 MG per tablet Take 1 tablet by mouth daily. 5 tablet 0  . glucose blood (ACCU-CHEK AVIVA PLUS) test strip Use as instructed to check blood sugars 8 times per day dx code 250.02 250 each 3  . Insulin Degludec (TRESIBA FLEXTOUCH) 200 UNIT/ML SOPN Inject 94 Units into the skin daily. 6 pen 3  . insulin glargine (LANTUS) 100 UNIT/ML injection Inject into the skin at bedtime. Inject 70 units in am and 24 units in pm    .  ipratropium (ATROVENT HFA) 17 MCG/ACT inhaler Inhale 2 puffs into the lungs 2 (two) times daily as needed (shortness of breath). (Patient not taking: Reported on 05/24/2015) 1 Inhaler 11  . LANTUS 100 UNIT/ML injection inject 64 units subcutaneously and 24 units at bedtime 30 vial 3  . levalbuterol (XOPENEX HFA) 45 MCG/ACT inhaler Inhale 2 puffs into the lungs every 6 (six) hours as needed for wheezing or shortness of breath. 1 Inhaler 11  . magic mouthwash SOLN Swish with 2 teaspoonfuls by mouth as needed 240 mL 3  . meclizine (ANTIVERT) 25 MG tablet take 1 tablet by mouth three times a day if needed for dizziness 90 tablet 3  . methocarbamol (ROBAXIN) 500 MG tablet Take 1 tablet 3 times per day as needed for 10 days 30 tablet 0  . metoprolol succinate (TOPROL-XL) 25 MG 24 hr tablet take 1 tablet by mouth once daily 30 tablet 5  . NOVOLOG FLEXPEN 100 UNIT/ML FlexPen INJECT 15 UNITS 3 TIMES A DAY 15 mL 3  . omeprazole (PRILOSEC) 40 MG capsule Take 1 capsule (40 mg total) by mouth daily. 30 capsule 2  . pravastatin (PRAVACHOL) 20 MG tablet take  1 tablet by mouth once daily 30 tablet 5  . Tiotropium Bromide-Olodaterol (STIOLTO RESPIMAT) 2.5-2.5 MCG/ACT AERS Inhale 2 puffs into the lungs daily. 1 Inhaler 11  . trimethoprim (TRIMPEX) 100 MG tablet Reported on 06/08/2015  1  . valsartan (DIOVAN) 160 MG tablet Take 1 tablet (160 mg total) by mouth daily. 30 tablet 5  . VIGAMOX 0.5 % ophthalmic solution Reported on 06/08/2015     No current facility-administered medications on file prior to visit.

## 2015-06-25 NOTE — Telephone Encounter (Signed)
Spoke with pt, aware of results/recs.  rx's sent to preferred pharmacy.  Nothing further needed.

## 2015-06-25 NOTE — Telephone Encounter (Signed)
Ok to refill benzonatate 200 mg perles for total 6 months  Offer amoxacillin 500 mg, # 14, 1 twice daily

## 2015-07-01 ENCOUNTER — Other Ambulatory Visit: Payer: Self-pay | Admitting: Internal Medicine

## 2015-07-01 MED ORDER — TIOTROPIUM BROMIDE-OLODATEROL 2.5-2.5 MCG/ACT IN AERS: 2.0000 | INHALATION_SPRAY | Freq: Every day | RESPIRATORY_TRACT | Status: DC

## 2015-07-01 NOTE — Telephone Encounter (Signed)
Per CY-okay to refill as requested. 

## 2015-07-07 ENCOUNTER — Other Ambulatory Visit: Payer: Self-pay | Admitting: Endocrinology

## 2015-07-08 ENCOUNTER — Ambulatory Visit (INDEPENDENT_AMBULATORY_CARE_PROVIDER_SITE_OTHER): Payer: Medicare Other | Admitting: Internal Medicine

## 2015-07-08 ENCOUNTER — Ambulatory Visit: Payer: Medicare Other | Admitting: Endocrinology

## 2015-07-08 ENCOUNTER — Encounter: Payer: Self-pay | Admitting: Internal Medicine

## 2015-07-08 VITALS — BP 122/70 | HR 90 | Ht 63.0 in | Wt 192.2 lb

## 2015-07-08 DIAGNOSIS — J3089 Other allergic rhinitis: Secondary | ICD-10-CM

## 2015-07-08 DIAGNOSIS — J449 Chronic obstructive pulmonary disease, unspecified: Secondary | ICD-10-CM

## 2015-07-08 DIAGNOSIS — J9611 Chronic respiratory failure with hypoxia: Secondary | ICD-10-CM

## 2015-07-08 DIAGNOSIS — J309 Allergic rhinitis, unspecified: Secondary | ICD-10-CM | POA: Diagnosis not present

## 2015-07-08 DIAGNOSIS — J302 Other seasonal allergic rhinitis: Secondary | ICD-10-CM

## 2015-07-08 NOTE — Progress Notes (Signed)
Patient ID: [name], female    DOB: 1936/03/22, 80 y.o.   MRN: [id number]  HPI SATURATION QUALIFICATIONS: Patient Saturations on Room Air while Ambulating = 84% Patient Saturations on 2.5 Liters of oxygen while Ambulating = 92%[name],CMA  09/28/10-  75 yoF former smoker with COPD, complicated by chronic rhinitis, GERD. Last here 04/29/10- note reviewed. Acute visit.-- Woke with soreness across under her breasts. Losing voice. Coughing spells, with much liquid coming up and from nose. Prior to that hadn't been coughing. C/o weak, dizzy, light headed- relieved by her oxygen. . Moving into a smaller apartment. Denies fever. Easy DOE short walks room to room.   10/31/10- 56 yoF former smoker with COPD, complicated by chronic rhinitis, GERD. She denies major change since last here. Has to stay in to avoid heat and on her oxygen much of the time in the house. Coughs scant clear or lemon yellow, with some hoarseness which comes and goes. She describes hoarseness as worse with phone conversations, variable but rarely if ever clear. She had seen [name] years ago for epistaxis. Easy choke with eating. Discussed aspiration precautions, chin tuck.   02/26/11- 52 yoF former smoker with COPD, complicated by chronic rhinitis, GERD. Has had flu vaccine. No coughing through the summer. We first cool days as she stepped outside she would begin to cough again. Dry hacking cough. Very aware of active reflux anytime she bends over, regurgitating recently swallowed food. Has also had some diarrhea. Complains of spasms intermittently left lower anterior chest and left upper quadrant. It may all be GI. She thinks it is part of her diabetic neuropathy. Has appointment soon with [name].  08/16/11-75 yoF former smoker with COPD, complicated by chronic rhinitis, GERD. During the winter had prolonged diarrhea evaluated by [name] and apparently attributed to her diabetic neuropathy. May have had  esophagitis-was told to take Magic mouthwash. Gradually easier dyspnea on exertion. Now on oxygen 2-3 L most of the time. She came today with acute complaint of aching pain intermittently for the past several weeks, dull. Associated with mid back at strap level. Pain is gone right now. She saw no effect of activity or position and said pain was nonpleuritic or radiating.  CXR- 08/16/11- reviewed w/ her. NAD with no acute process.   08/28/11-75 yoF former smoker with COPD, complicated by chronic rhinitis, GERD,  DM. Back pain comes and goes. She implies that it is better today and says that she really has been present a long time as part of her degenerative back pain. Complains of feeling sluggish and tired. Recent bronchitis with productive green sputum watery nose no fever. Using Atrovent. Intolerant of albuterol and Symbicort because of overstimulation. Home oxygen is 2-1/2-3 L. CXR 08/15/11-  IMPRESSION:  Hyperinflation.  No active cardiopulmonary disease.  Original Report Authenticated By: [name], M.D.   12/28/11- 23 yoF former smoker with COPD, complicated by chronic rhinitis, GERD,  DM. Not doing well-stays on O2 all the time; since being on Spiriva at first did great but has noticed having to stop more and rest during the day(nap) She  stays indoors most of the time without many friends to do things with. She often has self-limited sharp twinges right anterior chest wall and left infrascapular back. With her known degenerative disc disease, we discussed the possibility this was nerve root irritation. Spiriva helps variable cough.  04/23/12- 89 yoF former smoker with COPD, complicated by chronic rhinitis, GERD,  DM. FOLLOWS FOR: still  has good and bad days with SOB and activity levels Watery rhinorrhea. No acute problems. No respiratory problems associated with surgery for nerve impingement in her right hand. She continues oxygen 3 or 3 a half liters/Advanced PFT: 01/16/2012 severe  obstructive airways disease with insignificant response to bronchodilator, air trapping, diffusion moderately reduced. FEV1 0.82/45%, FEV1/FEC 0.46. Emphysema pattern on the loop. TLC 100%, RV 148%, DLCO 47%.  10/22/12- 43 yoF former smoker with COPD, complicated by chronic rhinitis, GERD,  DM FOLLOWS FOR:has to pace herself more; continues to have nervous spell(could be from DM) SOB pretty much all the time Episodic shaky spells for which she says she's had evaluation by neurology, ENT, primary physician and ER. "Electric shock sensation" from her known neuropathy. Stress makes all of this worse. Breathing has not really changed. We talked about the possibility she was having a vasomotor/autonomic neuropathy related to her diabetes  01/21/13- 77 yoF former smoker with COPD, complicated by chronic rhinitis, GERD,  DM FOLLOWS FOR:has to pace herself more; continues to have nervous spell(could be from DM) SOB pretty much all the time Has been treated for a corneal abscess with Cipro, another antibiotic for cough, and for chronic cystitis. Got blistering intertrigo which [name] treated with Flagyl by her report. Little change in her chronic cough with clear mucus. Twinges of bilateral tussive rib pains.  07/21/13-78 yoF former smoker with COPD, complicated by chronic rhinitis, GERD,  DM FOLLOWS FOR: Stays tired all the time; was in [facility] January 2015. Has RN that comes to her home. Pt states she has chest tightness as well. Stays tired. Home visiting nurse has completed. Chest gets tight-nebulizer helps. Watery nose.  09/02/2013 Acute OV  Complains of 2 weeks of cough and congestion . cough still going on-lt. green,occass. wheezing,chest tightness no fcs Was called in augmentin on 4/17, finished last dose yesterday. Mucus is some better but still coughing and wheezing . Cough is keeping her up at night.  Patient denies any this is chest pain, orthopnea, PND, or leg swelling CXR last ov showed  chronic changes with COPD 07/21/13   11/24/13- 78 yoF former smoker with COPD, complicated by chronic rhinitis, GERD,  DM FOLLOWS FOR:  Increased sob, tightness in chest and coughing spells O2 3.5L/ Advanced Needed prednisone/ Augmentin in April Has home health CNA helping now.  Dyspnea w/ exertion, persistent w/o much change. Occ cough, wheeze, watery nose. Occ choke/ light aspiration. Not needing nebulizer currently. CXR 07/21/13 IMPRESSION:  1. There is mild hyperinflation consistent with COPD. There is no  evidence of pneumonia.  2. There is no evidence of CHF. There is no pleural effusion.  Electronically Signed  By: David Martinique  On: 07/21/2013 10:50  03/27/14-  82 yoF former smoker with COPD, complicated by chronic rhinitis, GERD,  DM O2 2-3.5L/ Advanced FOLLOWS FOR: O2 levels have been dropping in 80's, according to a caregiver who has her own oximeter- accuracy unknown. . SOB  as well. Occ wheeze and cough x several months- sometimes some green. Maintenance zithromax bronchitis suppression seems to work. CXR 02/19/14 IMPRESSION: Hyperinflation without acute finding. Electronically Signed  By: Lorin Picket M.D.  On: 02/19/2014 12:22  07/08/14- 53 yoF former smoker with COPD, complicated by chronic rhinitis, GERD,  DM FOLLOWS FOR: Pt reports breathing better on Stiolto. She had been off due to ins coverage, she is now back on since it is covered. She has not been back on Stiolto 1 week yet.  01/08/15- 79 yoF former  smoker with COPD, complicated by chronic rhinitis, GERD,  DM FOLLOWS FOR: Continues to wear O2 2-3 L/ Advanced Blames Stiolto inhaler for increased blood sugar although Stiolto does not contain steroid. To see ENT about throat tickle cough/foreign body sensation in throat  07/08/2015-80 year old female former smoker followed for COPD, complicated by chronic rhinitis, GERD, DM O2 3-3.5 L/ Advanced FOLLOWS FOR: Pt c/o cough, using Tessalon Perles prn. Completed  Amox 554m - cough some improved. Reports terrible PND, runny nose and congestion, having some dizziness. Using O2 @ 3-3.5Liters CXR 04/08/2015-IMPRESSION: Low lung volumes with mild bibasilar atelectasis and/or infiltrates. Chronic cough may been a little better after antibiotic last week but long-term pattern is unchanged. Perles helps some. Noticing more postnasal drip recently. Has been having trouble with back pain-went to urgent care.  ROS-see HPI Constitutional:   No-   weight loss, night sweats, fevers, chills, fatigue, lassitude. HEENT:   No-  headaches, difficulty swallowing, tooth/dental problems, sore throat,       No-  sneezing, itching, ear ache, nasal congestion, post nasal drip,  CV:  No-   chest pain, orthopnea, PND, swelling in lower extremities, anasarca,                                                     dizziness, palpitations Resp: +shortness of breath with exertion or at rest.              + productive cough,  + non-productive cough,  No- coughing up of blood.              No-   change in color of mucus.  + wheezing.   Skin: No-   rash or lesions. GI:  +heartburn, indigestion, No-abdominal pain, nausea, vomiting,  GU: . MS:  No-   joint pain or swelling. Neuro-     nothing unusual Psych:  No- change in mood or affect. +depression or anxiety.  No memory loss.    Objective:  OBJ- Physical Exam    talkative General- Alert, Oriented, Affect-appropriate, Distress- none acute, + overweight, + talkative Skin- rash-none, lesions- none, excoriation- none Lymphadenopathy- none Head- atraumatic            Eyes- Gross vision intact, PERRLA, conjunctivae and secretions clear            Ears- Hearing, canals-normal            Nose- Clear, no-Septal dev, mucus, polyps, erosion, perforation             Throat- Mallampati IV , mucosa -no thrush , drainage- none, tonsils- atrophic, + hoarse, stridor-none Neck- flexible , trachea midline, no stridor , thyroid nl, carotid no  bruit Chest - symmetrical excursion , unlabored           Heart/CV- RRR , no murmur , no gallop  , no rub, nl s1 s2                           - JVD- none , edema- none, stasis changes- none, varices- none           Lung-  wheeze-none, cough- none while here , dullness-none, rub- none           Chest wall-  Abd-  Br/ Gen/ Rectal- Not done, not indicated  Extrem- cyanosis- none, clubbing, none, atrophy- none, strength- nl Neuro- grossly intact to observation

## 2015-07-08 NOTE — Assessment & Plan Note (Signed)
Although she complains about cough, she did not cough at all file here, even with deep breath.

## 2015-07-08 NOTE — Patient Instructions (Addendum)
Sample Dymista nasal spray     1-2 puffs each nostril once daily   See if this helps the nasal drip and cough

## 2015-07-08 NOTE — Assessment & Plan Note (Signed)
Noticing more postnasal drip in the last 2 weeks. She doesn't get outside much but this is probably seasonal allergy. Plan-try sample Dymista nasal spray

## 2015-07-08 NOTE — Assessment & Plan Note (Signed)
She remains dependent on oxygen, usually around 3 L

## 2015-07-12 ENCOUNTER — Ambulatory Visit: Payer: Medicare Other | Admitting: Endocrinology

## 2015-07-14 ENCOUNTER — Telehealth: Payer: Self-pay | Admitting: Internal Medicine

## 2015-07-14 MED ORDER — PREDNISONE 10 MG PO TABS: ORAL_TABLET | ORAL | Status: DC

## 2015-07-14 MED ORDER — AMOXICILLIN 500 MG PO CAPS: 500.0000 mg | ORAL_CAPSULE | Freq: Three times a day (TID) | ORAL | Status: DC

## 2015-07-14 NOTE — Telephone Encounter (Signed)
lmomtcb X1

## 2015-07-14 NOTE — Telephone Encounter (Signed)
Patient returned call, CB is [phone]

## 2015-07-14 NOTE — Telephone Encounter (Signed)
Called spoke with pt. Aware of recs. RX's sent in. Nothing further needed 

## 2015-07-14 NOTE — Telephone Encounter (Signed)
Called and spoke to pt. Pt states her cough has not improved and is now requesting prednisone, pt also c/o increase in SOB. Pt states the cough is productive with clear thick mucus, pt also c/o chest tightness with SOB - chest tightness resolves with rest. Pt denies f/c/s and states her PND has improved since starting the [place]. Pt requesting prednisone.   CY please advise. Thanks.  Allergies  Allergen Reactions  . Chlordiazepoxide-Clidinium Other (See Comments)    Sleepy,weak  . Biaxin [Clarithromycin]     Foul taste, abd pain, diarrhea  . Tramadol   . Codeine     REACTION: gi upset- only in high doses per pt.  Can tolerate cough syrup with codeine.     Current Outpatient Prescriptions on File Prior to Visit  Medication Sig Dispense Refill  . acetaminophen (TYLENOL) 500 MG tablet Take 500 mg by mouth every 6 (six) hours as needed for pain.     Marland Kitchen ALPRAZolam (XANAX) 0.5 MG tablet take 1/2 to 1 tablet by mouth twice a day 60 tablet 5  . aspirin 325 MG tablet Take 325 mg by mouth daily.      . B-D UF III MINI PEN NEEDLES 31G X 5 MM MISC USE 5 PER DAY TO INJECT INSULIN  0  . bacitracin ophthalmic ointment   0  . BD INSULIN SYRINGE ULTRAFINE 31G X 15/64" 0.5 ML MISC   1  . benzonatate (TESSALON) 200 MG capsule Take 1 capsule (200 mg total) by mouth 3 (three) times daily as needed for cough. 30 capsule 5  . cholestyramine (QUESTRAN) 4 G packet Take one package up to 2times a day as needed for diarrhea. 60 each 1  . dicyclomine (BENTYL) 10 MG capsule take 1 capsule by mouth once daily 30 capsule 5  . diphenoxylate-atropine (LOMOTIL) 2.5-0.025 MG per tablet Take 1 tablet by mouth daily. 5 tablet 0  . glucose blood (ACCU-CHEK AVIVA PLUS) test strip Use as instructed to check blood sugars 8 times per day dx code 250.02 250 each 3  . Insulin Degludec (TRESIBA FLEXTOUCH) 200 UNIT/ML SOPN Inject 94 Units into the skin daily. 6 pen 3  . insulin glargine (LANTUS) 100 UNIT/ML injection Inject into the  skin at bedtime. Inject 70 units in am and 24 units in pm    . ipratropium (ATROVENT HFA) 17 MCG/ACT inhaler Inhale 2 puffs into the lungs 2 (two) times daily as needed (shortness of breath). 1 Inhaler 11  . LANTUS 100 UNIT/ML injection inject 64 units subcutaneously and 24 units at bedtime 30 vial 3  . levalbuterol (XOPENEX HFA) 45 MCG/ACT inhaler Inhale 2 puffs into the lungs every 6 (six) hours as needed for wheezing or shortness of breath. 1 Inhaler 11  . magic mouthwash SOLN Swish with 2 teaspoonfuls by mouth as needed 240 mL 3  . meclizine (ANTIVERT) 25 MG tablet take 1 tablet by mouth three times a day if needed for dizziness 90 tablet 3  . methocarbamol (ROBAXIN) 500 MG tablet Take 1 tablet 3 times per day as needed for 10 days 30 tablet 0  . metoprolol succinate (TOPROL-XL) 25 MG 24 hr tablet take 1 tablet by mouth once daily 30 tablet 5  . NOVOLOG FLEXPEN 100 UNIT/ML FlexPen INJECT 15 UNITS 3 TIMES A DAY 15 mL 3  . omeprazole (PRILOSEC) 40 MG capsule Take 1 capsule (40 mg total) by mouth daily. 30 capsule 2  . OXYGEN 3-3.5 liters O2 pulsed    . pravastatin (  PRAVACHOL) 20 MG tablet take 1 tablet by mouth once daily 30 tablet 5  . Tiotropium Bromide-Olodaterol (STIOLTO RESPIMAT) 2.5-2.5 MCG/ACT AERS Inhale 2 puffs into the lungs daily. 1 Inhaler 11  . trimethoprim (TRIMPEX) 100 MG tablet Reported on 06/08/2015  1  . valsartan (DIOVAN) 160 MG tablet Take 1 tablet (160 mg total) by mouth daily. 30 tablet 5  . VIGAMOX 0.5 % ophthalmic solution Reported on 06/08/2015     No current facility-administered medications on file prior to visit.

## 2015-07-14 NOTE — Telephone Encounter (Signed)
Offer prednisone 10 mg, # 18,    3 x 3 days, 2 x 3 days, 1 x 3 days  Offer amoxacillin 500 mg, # 21,    1 three times daily

## 2015-07-15 ENCOUNTER — Encounter: Payer: Medicare Other | Attending: Physical Medicine & Rehabilitation | Admitting: Physical Medicine & Rehabilitation

## 2015-07-15 ENCOUNTER — Encounter: Payer: Self-pay | Admitting: Physical Medicine & Rehabilitation

## 2015-07-15 VITALS — BP 147/69 | HR 88 | Resp 14

## 2015-07-15 DIAGNOSIS — M792 Neuralgia and neuritis, unspecified: Secondary | ICD-10-CM

## 2015-07-15 DIAGNOSIS — J449 Chronic obstructive pulmonary disease, unspecified: Secondary | ICD-10-CM | POA: Diagnosis not present

## 2015-07-15 DIAGNOSIS — K219 Gastro-esophageal reflux disease without esophagitis: Secondary | ICD-10-CM | POA: Diagnosis not present

## 2015-07-15 DIAGNOSIS — R269 Unspecified abnormalities of gait and mobility: Secondary | ICD-10-CM | POA: Diagnosis not present

## 2015-07-15 DIAGNOSIS — G5601 Carpal tunnel syndrome, right upper limb: Secondary | ICD-10-CM | POA: Diagnosis not present

## 2015-07-15 DIAGNOSIS — K449 Diaphragmatic hernia without obstruction or gangrene: Secondary | ICD-10-CM | POA: Insufficient documentation

## 2015-07-15 DIAGNOSIS — I10 Essential (primary) hypertension: Secondary | ICD-10-CM | POA: Insufficient documentation

## 2015-07-15 DIAGNOSIS — G894 Chronic pain syndrome: Secondary | ICD-10-CM | POA: Insufficient documentation

## 2015-07-15 DIAGNOSIS — H409 Unspecified glaucoma: Secondary | ICD-10-CM | POA: Insufficient documentation

## 2015-07-15 DIAGNOSIS — M1990 Unspecified osteoarthritis, unspecified site: Secondary | ICD-10-CM | POA: Diagnosis not present

## 2015-07-15 DIAGNOSIS — K222 Esophageal obstruction: Secondary | ICD-10-CM | POA: Diagnosis not present

## 2015-07-15 DIAGNOSIS — M545 Low back pain: Secondary | ICD-10-CM | POA: Diagnosis not present

## 2015-07-15 DIAGNOSIS — K769 Liver disease, unspecified: Secondary | ICD-10-CM | POA: Insufficient documentation

## 2015-07-15 DIAGNOSIS — K5790 Diverticulosis of intestine, part unspecified, without perforation or abscess without bleeding: Secondary | ICD-10-CM | POA: Insufficient documentation

## 2015-07-15 DIAGNOSIS — K589 Irritable bowel syndrome without diarrhea: Secondary | ICD-10-CM | POA: Diagnosis not present

## 2015-07-15 DIAGNOSIS — J309 Allergic rhinitis, unspecified: Secondary | ICD-10-CM | POA: Diagnosis not present

## 2015-07-15 DIAGNOSIS — E1140 Type 2 diabetes mellitus with diabetic neuropathy, unspecified: Secondary | ICD-10-CM | POA: Insufficient documentation

## 2015-07-15 MED ORDER — DULOXETINE HCL 30 MG PO CPEP: 30.0000 mg | ORAL_CAPSULE | Freq: Every day | ORAL | Status: DC

## 2015-07-15 NOTE — Progress Notes (Signed)
Subjective:    Patient ID: [name], female    DOB: 1935/05/25, 80 y.o.   MRN: [id number]  HPI  80 y/o with pmh of CTS on right, ulnar nerve on right, COPD on supplemental O2, DM with diffuse neuropathy, scoliosis presents with pain in her lower back.  This present for ~25 years, getting progressively worse.  Denies inciting events.  Rest improves the pain.  Standing for prolonged postures exacerbated the pain. Pain is achy for the most part.  It is constant for the most part.  Today, severity is 8/10.  It can radiate to her LLE to her posterior leg. She denies associated numbness.  She has weakness with grip.  She has tried Tylenol, which helps.    Pain Inventory Average Pain 10 Pain Right Now 8 My pain is sharp, burning, stabbing and aching  In the last 24 hours, has pain interfered with the following? General activity 7 Relation with others 0 Enjoyment of life 9 What TIME of day is your pain at its worst? all Sleep (in general) Fair  Pain is worse with: walking and standing Pain improves with: no relief Relief from Meds: 0  Mobility walk with assistance use a cane use a walker how many minutes can you walk? ? ability to climb steps?  yes do you drive?  no  Function not employed: date last employed .  Neuro/Psych bladder control problems bowel control problems weakness tremor dizziness  Prior Studies x-rays new visit  Physicians involved in your care new visit  Family History  Problem Relation Age of Onset  . Lung cancer Mother     small cell;Byssinosis  . Heart disease Father   . Lung cancer Sister   . Liver cancer Sister     ? mets from another area of the body  . Diabetes      grandmother  . Stroke Maternal Grandfather    Social History   Social History  . Marital Status: Single    Spouse Name: N/A  . Number of Children: N/A  . Years of Education: N/A   Occupational History  . Retired     Social History Main Topics  . Smoking  status: Former Smoker    Types: Cigarettes    Quit date: 05/08/1988  . Smokeless tobacco: Never Used  . Alcohol Use: No  . Drug Use: No  . Sexual Activity: No   Other Topics Concern  . None   Social History Narrative   Past Surgical History  Procedure Laterality Date  . Cholecystectomy open  1978  . Liver biopsy  08-1992  . Parathyroid exploration    . Tonsillectomy and adenoidectomy    . Total abdominal hysterectomy    . Esophagogastroduodenoscopy  5462,70-35    H Hernia,es.stricture s/p dil 31F  . Colonoscopy  07-2001    mild diverticulosis  . Carpal tunnel release      right hand  . Cataract extraction      Bilateral  . Ulnar nerve transposition  12/07/2011    Procedure: ULNAR NERVE DECOMPRESSION/TRANSPOSITION;this was cancelled-not done  Surgeon: Cammie Sickle., MD;  Location: [place];  Service: Orthopedics;  Laterality: Right;  right ulnar nerve in situ decompression  . Ulnar tunnel release  03/07/2012    Procedure: CUBITAL TUNNEL RELEASE;  Surgeon: Roseanne Kaufman, MD;  Location: [place];  Service: Orthopedics;  Laterality: Right;  ulnar nerve release at the elbow    . Appendectomy    .  Eye surgery     Past Medical History  Diagnosis Date  . Irritable bowel syndrome   . Other chronic nonalcoholic liver disease   . Esophageal reflux   . Allergic rhinitis, cause unspecified     Sinus CT Rec 12-23-2009  . 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 Sciatica   . Shingles 2010  . Steatohepatitis   . Diabetes mellitus   . Diverticulosis   . Hiatal hernia   . Esophageal stricture   . Diarrhea   . GERD (gastroesophageal reflux disease)   . Shortness of breath   . Hypertension   . Arthritis   . Glaucoma   . Complication of anesthesia     takes along time to wake up  . Oxygen deficiency   . Benign paroxysmal positional vertigo 08/09/2012  . Neuropathy in diabetes ([place])  08/09/2012   BP 147/69 mmHg  Pulse 88  Resp 14  SpO2 92%  Opioid Risk Score:   Fall Risk Score:  `1  Depression screen PHQ 2/9  Depression screen [facility] 2/9 07/15/2015 05/24/2015 04/08/2015 07/03/2014  Decreased Interest 2 0 0 0  Down, Depressed, Hopeless 1 0 0 0  PHQ - 2 Score 3 0 0 0  Altered sleeping 1 - - -  Tired, decreased energy 2 - - -  Change in appetite 1 - - -  Feeling bad or failure about yourself  0 - - -  Trouble concentrating 0 - - -  Moving slowly or fidgety/restless 0 - - -  Suicidal thoughts 0 - - -  PHQ-9 Score 7 - - -  Difficult doing work/chores Somewhat difficult - - -     Review of Systems  Constitutional: Negative for fever and chills.  Respiratory: Positive for cough and shortness of breath.        COPD  Endocrine:       High  Blood sugar Low blood sugar    Musculoskeletal: Positive for back pain, gait problem and neck pain.  Neurological: Positive for weakness.  All other systems reviewed and are negative.      Objective:   Physical Exam HENT: Normocephalic, Atraumatic Eyes: EOMI, Conj WNL Cardio: S1, S2 normal, RRR Pulm: B/l clear to auscultation.  Effort normal Abd: Soft, non-distended, non-tender, BS+ MSK:  Gait Antalgic.   No significant TTP, but slightly along left hip/back  No edema.   No signficant TTP over left great trochanter, SI joint Neuro: CN II-XII grossly intact.    Sensation intact to light touch in all UE and LE dermatomes  Reflexes 2+ throughout  Strength  4/5 in all LE myotomes (pain inhibition)  Neg SLR b/l Skin: Warm and Dry    Assessment & Plan:  80 y/o with pmh of CTS on right, ulnar nerve on right, COPD on supplemental O2, DM with diffuse neuropathy, scoliosis presents with pain in her lower back.  1. Chronic pain syndrome  Most significant in lower back  Will refer to PT for treatment evaluation of low back and left hip pain and TENS.  Pt with financial limitations and driving limitations, so she can only go to  PT once.    Will order Cymbalta 66m, will consider increase on next visit  Pt is allergic to tramadol   2. Abnormality of gait  Cont cane for safety  3. DM with neuropathic pain  Will order Cymbalta 351m will consider increase on next visit  RTC 8  weeks

## 2015-07-21 ENCOUNTER — Other Ambulatory Visit: Payer: Self-pay | Admitting: *Deleted

## 2015-07-21 MED ORDER — "INSULIN SYRINGE-NEEDLE U-100 31G X 15/64"" 0.5 ML MISC": Status: DC

## 2015-08-05 ENCOUNTER — Telehealth: Payer: Self-pay | Admitting: Endocrinology

## 2015-08-05 ENCOUNTER — Ambulatory Visit (INDEPENDENT_AMBULATORY_CARE_PROVIDER_SITE_OTHER): Payer: Medicare Other | Admitting: Endocrinology

## 2015-08-05 ENCOUNTER — Encounter: Payer: Self-pay | Admitting: Endocrinology

## 2015-08-05 VITALS — BP 152/62 | HR 88 | Temp 98.5°F | Resp 14 | Ht 63.0 in | Wt 193.6 lb

## 2015-08-05 DIAGNOSIS — Z794 Long term (current) use of insulin: Secondary | ICD-10-CM

## 2015-08-05 DIAGNOSIS — E785 Hyperlipidemia, unspecified: Secondary | ICD-10-CM

## 2015-08-05 DIAGNOSIS — E118 Type 2 diabetes mellitus with unspecified complications: Secondary | ICD-10-CM | POA: Diagnosis not present

## 2015-08-05 DIAGNOSIS — E1165 Type 2 diabetes mellitus with hyperglycemia: Secondary | ICD-10-CM

## 2015-08-05 DIAGNOSIS — M545 Low back pain: Secondary | ICD-10-CM | POA: Diagnosis not present

## 2015-08-05 DIAGNOSIS — IMO0001 Reserved for inherently not codable concepts without codable children: Secondary | ICD-10-CM

## 2015-08-05 LAB — COMPREHENSIVE METABOLIC PANEL
ALBUMIN: 4.1 g/dL (ref 3.5–5.2)
ALK PHOS: 43 U/L (ref 39–117)
ALT: 20 U/L (ref 0–35)
AST: 27 U/L (ref 0–37)
BUN: 9 mg/dL (ref 6–23)
CHLORIDE: 98 meq/L (ref 96–112)
CO2: 31 mEq/L (ref 19–32)
Calcium: 9.7 mg/dL (ref 8.4–10.5)
Creatinine, Ser: 0.68 mg/dL (ref 0.40–1.20)
GFR: 88.91 mL/min (ref >60.00)
Glucose, Bld: 197 mg/dL — ABNORMAL HIGH (ref 70–99)
POTASSIUM: 4.1 meq/L (ref 3.5–5.1)
SODIUM: 137 meq/L (ref 135–145)
TOTAL PROTEIN: 7.5 g/dL (ref 6.0–8.3)
Total Bilirubin: 0.3 mg/dL (ref 0.2–1.2)

## 2015-08-05 LAB — CBC
HEMATOCRIT: 39.9 % (ref 36.0–46.0)
HEMOGLOBIN: 12.8 g/dL (ref 12.0–15.0)
MCHC: 32.1 g/dL (ref 30.0–36.0)
MCV: 83.2 fl (ref 78.0–100.0)
Platelets: 161 10*3/uL (ref 150.0–400.0)
RBC: 4.79 Mil/uL (ref 3.87–5.11)
RDW: 14.8 % (ref 11.5–15.5)
WBC: 8.1 10*3/uL (ref 4.0–10.5)

## 2015-08-05 LAB — LIPID PANEL
CHOLESTEROL: 147 mg/dL (ref 0–200)
HDL: 41.9 mg/dL (ref >39.00)
LDL CALC: 77 mg/dL (ref 0–99)
NonHDL: 105.07
TRIGLYCERIDES: 139 mg/dL (ref 0.0–149.0)
Total CHOL/HDL Ratio: 4
VLDL: 27.8 mg/dL (ref 0.0–40.0)

## 2015-08-05 LAB — POCT GLYCOSYLATED HEMOGLOBIN (HGB A1C): Hemoglobin A1C: 10.2

## 2015-08-05 MED ORDER — CELECOXIB 100 MG PO CAPS: 100.0000 mg | ORAL_CAPSULE | Freq: Two times a day (BID) | ORAL | Status: DC

## 2015-08-05 NOTE — Progress Notes (Signed)
Patient ID: [name], female   DOB: 1935/12/21, 80 y.o.   MRN: [id number]   Reason for Appointment: follow-up of multiple problems  History of Present Illness    DIABETES type II since 1980   She has been on insulin using Lantus and Humalog for several years and usually A1c around 8% despite fairly good compliance with diet, exercise and glucose monitoring. She also had been on Actos previously which probably benefited her especially with her fatty liver but she stopped it because of fear of side effects      Recent history:   Insulin regimen: Lantus twice a day, 70--24 acs with syringe and Novolog 10-12  Her A1c has been consistently around 9- 10%  She is taking significantly larger doses of Lantus in the morning over the last few months She was again recommended a trial of Tresiba but she did not want to try this as she is afraid of hypoglycemia She says she is taking LANTUS with a syringe because her skin is hard and cannot use the pen needle easily Her A1c is at the highest level in the long time at 10.2 now   She does have the following patterns:  Overnight/early morning readings are still overall high and higher than usual although blood sugars around breakfast time are the best of the day  Her glucose levels increased progressively in the mornings and afternoons and are averaging over 200 the rest of the day  Blood sugars are much higher when she got prednisone about 2-3 weeks ago and she did not get them down with extra NovoLog  improving recently but she still has high readings during the night even without any extra snacks  She does not like to take enough Novolog and is taking relatively low doses compared to her basal insulin even when blood sugars are over 200 pre-meal  She still checks her sugars about 5 times a day  No hypoglycemia , lowest reading 68 on 1 morning which is unusual  Generally she tends to get symptoms with low normal sugars    Still not able to do much physical activity but her weight is stable  Oral hypoglycemic drugs: none       Side effects from medications: None         Proper timing of medications in relation to meals: Yes.         Monitors blood glucose:  5 times a day   Glucometer:  Accu-Chek         Blood Glucose averages recently from meter download recently :  Mean values apply above for all meters except median for One Touch  PRE-MEAL Fasting Lunch Dinner Bedtime Overall  Glucose range: 68-214  176-269  140-342  177-399    Mean/median: 156  204  228  262  217    Meals: 3 meals per day.  Smaller portions; lunch 1-2 pm Supper 6 to 7 PM     Physical activity: exercise: Unable to do any because of dyspnea and fatigue           Dietician visit: Most recent: Several years ago           Complications: are: Peripheral neuropathy with sensory loss     Wt Readings from Last 3 Encounters:  08/05/15 193 lb 9.6 oz (87.816 kg)  07/08/15 192 lb 3.2 oz (87.181 kg)  06/08/15 195 lb 9.6 oz (88.724 kg)    Lab Results  Component Value Date  HGBA1C 10.2 08/05/2015   HGBA1C 9.6 04/26/2015   HGBA1C 9.9* 12/11/2014   Lab Results  Component Value Date   MICROALBUR 6.9* 10/14/2014   LDLCALC 77 08/05/2015   CREATININE 0.68 08/05/2015     OTHER problems  are detailed in review of systems     Medication List       This list is accurate as of: 08/05/15 11:59 PM.  Always use your most recent med list.               acetaminophen 500 MG tablet  Commonly known as:  TYLENOL  Take 500 mg by mouth every 6 (six) hours as needed for pain.     ALPRAZolam 0.5 MG tablet  Commonly known as:  XANAX  take 1/2 to 1 tablet by mouth twice a day     aspirin 325 MG tablet  Take 325 mg by mouth daily.     B-D UF III MINI PEN NEEDLES 31G X 5 MM Misc  Generic drug:  Insulin Pen Needle  USE 5 PER DAY TO INJECT INSULIN     bacitracin ophthalmic ointment     benzonatate 200 MG capsule  Commonly known as:   TESSALON  Take 1 capsule (200 mg total) by mouth 3 (three) times daily as needed for cough.     celecoxib 100 MG capsule  Commonly known as:  CELEBREX  Take 1 capsule (100 mg total) by mouth 2 (two) times daily.     cholestyramine 4 g packet  Commonly known as:  QUESTRAN  Take one package up to 2times a day as needed for diarrhea.     dicyclomine 10 MG capsule  Commonly known as:  BENTYL  take 1 capsule by mouth once daily     diphenoxylate-atropine 2.5-0.025 MG tablet  Commonly known as:  LOMOTIL  Take 1 tablet by mouth daily.     DULoxetine 30 MG capsule  Commonly known as:  CYMBALTA  Take 1 capsule (30 mg total) by mouth daily.     glucose blood test strip  Commonly known as:  ACCU-CHEK AVIVA PLUS  Use as instructed to check blood sugars 8 times per day dx code 250.02     Insulin Degludec 200 UNIT/ML Sopn  Commonly known as:  TRESIBA FLEXTOUCH  Inject 94 Units into the skin daily.     insulin glargine 100 UNIT/ML injection  Commonly known as:  LANTUS  Inject into the skin at bedtime. Inject 70 units in am and 24 units in pm     Insulin Syringe-Needle U-100 31G X 15/64" 0.5 ML Misc  Commonly known as:  BD INSULIN SYRINGE ULTRAFINE  Use 2 per day dx code E11.65     ipratropium 17 MCG/ACT inhaler  Commonly known as:  ATROVENT HFA  Inhale 2 puffs into the lungs 2 (two) times daily as needed (shortness of breath).     levalbuterol 45 MCG/ACT inhaler  Commonly known as:  XOPENEX HFA  Inhale 2 puffs into the lungs every 6 (six) hours as needed for wheezing or shortness of breath.     magic mouthwash Soln  Swish with 2 teaspoonfuls by mouth as needed     meclizine 25 MG tablet  Commonly known as:  ANTIVERT  take 1 tablet by mouth three times a day if needed for dizziness     methocarbamol 500 MG tablet  Commonly known as:  ROBAXIN  Take 1 tablet 3 times per day as needed for 10 days  metoprolol succinate 25 MG 24 hr tablet  Commonly known as:  TOPROL-XL  take  1 tablet by mouth once daily     NOVOLOG FLEXPEN 100 UNIT/ML FlexPen  Generic drug:  insulin aspart  INJECT 15 UNITS 3 TIMES A DAY     omeprazole 40 MG capsule  Commonly known as:  PRILOSEC  Take 1 capsule (40 mg total) by mouth daily.     OXYGEN  3-3.5 liters O2 pulsed     pravastatin 20 MG tablet  Commonly known as:  PRAVACHOL  take 1 tablet by mouth once daily     predniSONE 10 MG tablet  Commonly known as:  DELTASONE  Take 3 tabs daily x 3 days, 2 tabs daily x 3 days, 1 tab daily x 3 days then stop     Tiotropium Bromide-Olodaterol 2.5-2.5 MCG/ACT Aers  Commonly known as:  STIOLTO RESPIMAT  Inhale 2 puffs into the lungs daily.     trimethoprim 100 MG tablet  Commonly known as:  TRIMPEX  Reported on 06/08/2015     valsartan 160 MG tablet  Commonly known as:  DIOVAN  Take 1 tablet (160 mg total) by mouth daily.     VIGAMOX 0.5 % ophthalmic solution  Generic drug:  moxifloxacin  Reported on 06/08/2015        Allergies:  Allergies  Allergen Reactions  . Chlordiazepoxide-Clidinium Other (See Comments)    Sleepy,weak  . Biaxin [Clarithromycin]     Foul taste, abd pain, diarrhea  . Tramadol   . Codeine     REACTION: gi upset- only in high doses per pt.  Can tolerate cough syrup with codeine.     Past Medical History  Diagnosis Date  . Irritable bowel syndrome   . Other chronic nonalcoholic liver disease   . Esophageal reflux   . Allergic rhinitis, cause unspecified     Sinus CT Rec 12-23-2009  . 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 Sciatica   . Shingles 2010  . Steatohepatitis   . Diabetes mellitus   . Diverticulosis   . Hiatal hernia   . Esophageal stricture   . Diarrhea   . GERD (gastroesophageal reflux disease)   . Shortness of breath   . Hypertension   . Arthritis   . Glaucoma   . Complication of anesthesia     takes along time to wake up  . Oxygen deficiency   . Benign paroxysmal  positional vertigo 08/09/2012  . Neuropathy in diabetes ([place]) 08/09/2012    Past Surgical History  Procedure Laterality Date  . Cholecystectomy open  1978  . Liver biopsy  08-1992  . Parathyroid exploration    . Tonsillectomy and adenoidectomy    . Total abdominal hysterectomy    . Esophagogastroduodenoscopy  7414,23-95    H Hernia,es.stricture s/p dil 38F  . Colonoscopy  07-2001    mild diverticulosis  . Carpal tunnel release      right hand  . Cataract extraction      Bilateral  . Ulnar nerve transposition  12/07/2011    Procedure: ULNAR NERVE DECOMPRESSION/TRANSPOSITION;this was cancelled-not done  Surgeon: Cammie Sickle., MD;  Location: [facility];  Service: Orthopedics;  Laterality: Right;  right ulnar nerve in situ decompression  . Ulnar tunnel release  03/07/2012    Procedure: CUBITAL TUNNEL RELEASE;  Surgeon: Roseanne Kaufman, MD;  Location: [facility];  Service: Orthopedics;  Laterality: Right;  ulnar nerve release at the elbow    . Appendectomy    . Eye surgery      Family History  Problem Relation Age of Onset  . Lung cancer Mother     small cell;Byssinosis  . Heart disease Father   . Lung cancer Sister   . Liver cancer Sister     ? mets from another area of the body  . Diabetes      grandmother  . Stroke Maternal Grandfather     Social History:  reports that she quit smoking about 27 years ago. Her smoking use included Cigarettes. She has never used smokeless tobacco. She reports that she does not drink alcohol or use illicit drugs.  Review of Systems:   LOW back pain, left hip pain: She is  having significant pain from scoliosis and other problems but still hesitating about starting Cymbalta that was recommended by the pain clinic She thinks her back and hip pain was better when she was taking prednisone. Not clear if she had benefited from Celebrex given last summer and she thinks it had caused some increased heartburn  She is  asking about her hands and upper extremity shaking and trembling at different times, this can be interfering with her normal activities  HYPERTENSION:   blood pressure is Fairly good today, she thinks it has been variable and occasionally higher.  Generally controlled with  160 mg Diovan  She takes Xanax at night mostly and lorazepam in the daytime for anxiety  HYPERLIPIDEMIA:  The lipid abnormality consists of elevated LDL treated with pravastatin 20 mg only which she has no side effects from    Her last LDL is  below 100 Usually her triglycerides Are  relatively high   Lab Results  Component Value Date   CHOL 147 08/05/2015   HDL 41.90 08/05/2015   LDLCALC 77 08/05/2015   LDLDIRECT 89.0 10/14/2014   TRIG 139.0 08/05/2015   CHOLHDL 4 08/05/2015    She is followed by pulmonologist for chronic COPD, taking home oxygen and symptoms are cough, dyspnea and recurrent bronchitis  Dizziness- still has occasional dizzy spells of unclear etiology, Tends to feel off balance also   Diarrhea: She has had long-standing intermittent diarrhea possibly IBS. Is taking Questran as needed, may take this daily.  Occasionally takes Lomotil.    NEUROPATHY: She has numbness in her feet.  Also complaining of some tingling and numbness in her hands Foot exam done in 8/15: Foot exam shows sensory loss distally, normal pulses and no skin lesions   Examination:   BP 152/62 mmHg  Pulse 88  Temp(Src) 98.5 F (36.9 C)  Resp 14  Ht 5' 3"  (1.6 m)  Wt 193 lb 9.6 oz (87.816 kg)  BMI 34.30 kg/m2  SpO2 94%  Body mass index is 34.3 kg/(m^2).   No pedal edema She has a coarse tremor of her outstretched hands.  No rest tremor  ASSESSMENT/ PLAN:   Diabetes type 2  See history of present illness for detailed discussion of his current management, blood sugar patterns and problems identified  The patient's diabetes control is  overall poor and requiring very large doses of Basal insulin Currently taking  inadequate amounts of insulin especially during the daytime when blood sugars are progressively higher Explained to her that Tyler Aas may be better suited for her with once a day injection which she can do with a pen and she finally agrees to try it. She will start with smaller dose  of 70 units and buildup every 5 days by 6 units  May also need higher doses of Novolog although she usually does not increase the dose as instructed Alternatively she is also good candidate for the U-500 insulin is the only insulin to be used; she is very reluctant to make significant changes in her management however  TREMOR: Appears to be essential tremor and will check with pulmonologist about using propranolol instead of Toprol  Pain in back and joints: she can try Celebrex since she is very reluctant to try Cymbalta  HYPERTENSION:  Good control  Hyperlipidemia: Needs follow-up levels  Counseling time over 50% of today's 25 minute visit on above topics   Patient Instructions  Stop Lantus and start Tresiba 70 units in am and every 5 days go up 6 units until am sugar in am under <150  Take 2-4 extra Novolog when glucose >200     Allexus Ovens 08/06/2015, 9:04 AM

## 2015-08-05 NOTE — Patient Instructions (Addendum)
Stop Lantus and start Tresiba 70 units in am and every 5 days go up 6 units until am sugar in am under <150  Take 2-4 extra Novolog when glucose >200

## 2015-08-05 NOTE — Telephone Encounter (Signed)
Patient ask you to call her about her medication.

## 2015-08-06 LAB — FRUCTOSAMINE: FRUCTOSAMINE: 352 umol/L — AB (ref 0–285)

## 2015-08-06 NOTE — Telephone Encounter (Signed)
Noted, patient is aware.

## 2015-08-06 NOTE — Telephone Encounter (Signed)
Line is busy at this time.

## 2015-08-06 NOTE — Telephone Encounter (Signed)
She can take 16 units of Novolog at suppertime if blood sugar is over 250.  She can increase to [place] on Monday if blood sugars are still over 200

## 2015-08-06 NOTE — Telephone Encounter (Signed)
[name] called this morning about the new insulin you put her on, she said her blood sugar was 270, then she took 70 units this morning at 9, she wanted to hold on on the novolog because she was afraid her sugar would go to low. She said at 11 her sugar went up to 332 and she took 10 units of Novolog.  She wants to know if she should take more novolog?

## 2015-08-09 ENCOUNTER — Telehealth: Payer: Self-pay | Admitting: *Deleted

## 2015-08-09 ENCOUNTER — Telehealth: Payer: Self-pay | Admitting: Endocrinology

## 2015-08-09 NOTE — Telephone Encounter (Signed)
New pt of Dr. Ena Dawley, states she was prescribed cymbalta during last visit and reports she was in terrible pain after leaving clinic. Claims she was having to take prednisone for a terrible cough

## 2015-08-09 NOTE — Telephone Encounter (Signed)
She states that the prednisone made her nervous and jittery which made her reluctant to take the cymbalta.  She says she does not want to continue care with Dr. Posey Pronto....FYI

## 2015-08-09 NOTE — Telephone Encounter (Signed)
Increase Tresiba up to 84 units, if she takes this in the morning can take the remainder now. Increase NOVOLOG by 4 units more than what she is taking until blood sugars are below 200

## 2015-08-09 NOTE — Telephone Encounter (Signed)
Noted, patient is aware.

## 2015-08-09 NOTE — Telephone Encounter (Signed)
Her blood sugars the last 3 days are:  Saturday- 1:01 am-257        5 am-    307        7:31 am-278        11:21 am- 272         1:19 pm- 259         5:19 pm- 220         9:23 pm- 242 Sunday- 12:13 am- 258       2:58 a- 278       5:49 am215       7:08 am-283        9:37 am-295       12:46 pm- 316         5:22 pm- 270        10:24 pm 284 Monday- 6:25 am-242                8:10 am- 263                11 :17 am- 336                1:10 pm- 287. She is taking 70 units of Tresiba and the extra novolog you told her to take.

## 2015-08-09 NOTE — Telephone Encounter (Signed)
PT requests call back from you, she said she needs to discuss insulins and that it was too much to give to me so she wants to just talk to you directly.

## 2015-08-12 ENCOUNTER — Telehealth: Payer: Self-pay | Admitting: Internal Medicine

## 2015-08-12 NOTE — Telephone Encounter (Signed)
Called and [facility] and spoke to pharmacist and informed her that a PA is needed for pt's [employer]. Initiated PA and PA was approved until 08/11/2016. Case # [id number]. Called and spoke to pharmacy tech at [employer] and informed her of the approval. Called and informed pt of the approval. Pt verbalized understanding and denied any further questions or concerns at this time.

## 2015-08-16 ENCOUNTER — Telehealth: Payer: Self-pay | Admitting: *Deleted

## 2015-08-16 NOTE — Telephone Encounter (Signed)
Noted, she is aware, and she is taking the 84 units of [place].

## 2015-08-16 NOTE — Telephone Encounter (Signed)
[name] called today, she said her sugar is still consistently running between 240-300.  On the 8th the lowest reading was 178 and this morning fasting it was145.  Please advise.

## 2015-08-16 NOTE — Telephone Encounter (Signed)
Since fasting reading is much better today needs to increase her Novolog by 4 units at every meal.  Is she taking 84 Tresiba?

## 2015-08-23 ENCOUNTER — Telehealth: Payer: Self-pay | Admitting: Endocrinology

## 2015-08-23 ENCOUNTER — Telehealth: Payer: Self-pay | Admitting: *Deleted

## 2015-08-23 NOTE — Telephone Encounter (Signed)
noted 

## 2015-08-23 NOTE — Telephone Encounter (Signed)
Patient called the medical call center on Friday 08/20/15, she told them that she had cystitis and it was flaring up and needed medication  Dr. Sarajane Jews called in Cipro 500 mg one tablet twice a day for 5 days

## 2015-08-23 NOTE — Telephone Encounter (Signed)
Noted, need to sign verbal order

## 2015-08-23 NOTE — Telephone Encounter (Signed)
About 3 am on Friday the 14th, she started having problems, her urine is cloudy, very tired, she said it felt like she had a razor blade going down her urethra, burning.

## 2015-08-23 NOTE — Telephone Encounter (Signed)
Patient called [facility] call center states she need a refill of medication Cipro, she states pain level is 4-5 on the scale of 0 to 10.

## 2015-09-06 ENCOUNTER — Ambulatory Visit (INDEPENDENT_AMBULATORY_CARE_PROVIDER_SITE_OTHER): Payer: Medicare Other | Admitting: Endocrinology

## 2015-09-06 ENCOUNTER — Encounter: Payer: Self-pay | Admitting: Endocrinology

## 2015-09-06 ENCOUNTER — Other Ambulatory Visit: Payer: Self-pay | Admitting: *Deleted

## 2015-09-06 VITALS — BP 130/64 | HR 85 | Temp 97.9°F | Resp 14 | Ht 63.0 in | Wt 191.2 lb

## 2015-09-06 DIAGNOSIS — E1165 Type 2 diabetes mellitus with hyperglycemia: Secondary | ICD-10-CM

## 2015-09-06 DIAGNOSIS — Z794 Long term (current) use of insulin: Secondary | ICD-10-CM

## 2015-09-06 DIAGNOSIS — K625 Hemorrhage of anus and rectum: Secondary | ICD-10-CM

## 2015-09-06 DIAGNOSIS — M5412 Radiculopathy, cervical region: Secondary | ICD-10-CM

## 2015-09-06 MED ORDER — INSULIN ASPART 100 UNIT/ML FLEXPEN: PEN_INJECTOR | SUBCUTANEOUS | Status: DC

## 2015-09-06 NOTE — Patient Instructions (Addendum)
Novolog 14 at breakfast, 16-18 lunch and 20-22   May need 2-4 more in 1 week if sugars stay >200

## 2015-09-06 NOTE — Progress Notes (Signed)
Patient ID: [name], female   DOB: 02-11-1936, 80 y.o.   MRN: [id number]   Reason for Appointment: follow-up of multiple problems  History of Present Illness    DIABETES type II since 1980   She has been on insulin using Lantus and Humalog for several years and usually A1c around 8% despite fairly good compliance with diet, exercise and glucose monitoring. She also had been on Actos previously which probably benefited her especially with her fatty liver but she stopped it because of fear of side effects      Recent history:   Insulin regimen: Tresiba  84 acs with and Novolog 10-12 at breakfast and lunch and 14 at supper  Her A1c has been consistently around 9- 10%  Her A1c was at the highest level in the long time at 10.2  She was changed from Lantus to [place] insulin and this was increased to 84 units, previously on 94 units of Lantus daily  She does have the following patterns:  Her morning readings are relatively better and the lowest of the day although has only a couple of readings near normal  Blood sugars appear to be progressively rising from morning to evening as before and she thinks they go up even when she is having coffee or a small meal in the morning  She thinks she is more hungry with the new insulin and having some snacks but her weight has not gone up  She has increase her Novolog only a little but does not like to take much at suppertime because of fear of nocturnal hypoglycemia  She still checks her sugars about 5 times a day  She does not think she is drinking fluids with sugar even with increased thirst  On an average her blood sugars are almost the same as with Lantus  Oral hypoglycemic drugs: none       Side effects from medications: None         Proper timing of medications in relation to meals: Yes.         Monitors blood glucose:  5 times a day   Glucometer:  Accu-Chek         Blood Glucose averages recently from meter download  recently :  Mean values apply above for all meters except median for One Touch  PRE-MEAL Fasting Lunch Dinner Bedtime Overall  Glucose range: 127-224  144-284  174-380  216-389    Mean/median: 166 240  247  272  222+/-52    Overnight average 245  Meals: 3 meals per day.  Smaller portions; lunch 1-2 pm Supper 6 to 7 PM     Physical activity: exercise: Unable to do any because of dyspnea and fatigue            Dietician visit: Most recent: Several years ago           Complications: are: Peripheral neuropathy with sensory loss     Wt Readings from Last 3 Encounters:  09/06/15 191 lb 3.2 oz (86.728 kg)  08/05/15 193 lb 9.6 oz (87.816 kg)  07/08/15 192 lb 3.2 oz (87.181 kg)    Lab Results  Component Value Date   HGBA1C 10.2 08/05/2015   HGBA1C 9.6 04/26/2015   HGBA1C 9.9* 12/11/2014   Lab Results  Component Value Date   MICROALBUR 6.9* 10/14/2014   LDLCALC 77 08/05/2015   CREATININE 0.68 08/05/2015     OTHER problems  are detailed in review of systems  Medication List       This list is accurate as of: 09/06/15 11:59 PM.  Always use your most recent med list.               acetaminophen 500 MG tablet  Commonly known as:  TYLENOL  Take 500 mg by mouth every 6 (six) hours as needed for pain.     ALPRAZolam 0.5 MG tablet  Commonly known as:  XANAX  take 1/2 to 1 tablet by mouth twice a day     aspirin 325 MG tablet  Take 325 mg by mouth daily.     B-D UF III MINI PEN NEEDLES 31G X 5 MM Misc  Generic drug:  Insulin Pen Needle  USE 5 PER DAY TO INJECT INSULIN     bacitracin ophthalmic ointment  Reported on 09/06/2015     benzonatate 200 MG capsule  Commonly known as:  TESSALON  Take 1 capsule (200 mg total) by mouth 3 (three) times daily as needed for cough.     celecoxib 100 MG capsule  Commonly known as:  CELEBREX  Take 1 capsule (100 mg total) by mouth 2 (two) times daily.     cholestyramine 4 g packet  Commonly known as:  QUESTRAN  Take one  package up to 2times a day as needed for diarrhea.     dicyclomine 10 MG capsule  Commonly known as:  BENTYL  take 1 capsule by mouth once daily     diphenoxylate-atropine 2.5-0.025 MG tablet  Commonly known as:  LOMOTIL  Take 1 tablet by mouth daily.     DULoxetine 30 MG capsule  Commonly known as:  CYMBALTA  Take 1 capsule (30 mg total) by mouth daily.     glucose blood test strip  Commonly known as:  ACCU-CHEK AVIVA PLUS  Use as instructed to check blood sugars 8 times per day dx code 250.02     insulin aspart 100 UNIT/ML FlexPen  Commonly known as:  NOVOLOG FLEXPEN  Inject 20 units three times a day     Insulin Degludec 200 UNIT/ML Sopn  Commonly known as:  TRESIBA FLEXTOUCH  Inject 94 Units into the skin daily.     Insulin Syringe-Needle U-100 31G X 15/64" 0.5 ML Misc  Commonly known as:  BD INSULIN SYRINGE ULTRAFINE  Use 2 per day dx code E11.65     ipratropium 17 MCG/ACT inhaler  Commonly known as:  ATROVENT HFA  Inhale 2 puffs into the lungs 2 (two) times daily as needed (shortness of breath).     levalbuterol 45 MCG/ACT inhaler  Commonly known as:  XOPENEX HFA  Inhale 2 puffs into the lungs every 6 (six) hours as needed for wheezing or shortness of breath.     magic mouthwash Soln  Swish with 2 teaspoonfuls by mouth as needed     meclizine 25 MG tablet  Commonly known as:  ANTIVERT  take 1 tablet by mouth three times a day if needed for dizziness     methocarbamol 500 MG tablet  Commonly known as:  ROBAXIN  Take 1 tablet 3 times per day as needed for 10 days     metoprolol succinate 25 MG 24 hr tablet  Commonly known as:  TOPROL-XL  take 1 tablet by mouth once daily     omeprazole 40 MG capsule  Commonly known as:  PRILOSEC  Take 1 capsule (40 mg total) by mouth daily.     OXYGEN  3-3.5 liters O2  pulsed     pravastatin 20 MG tablet  Commonly known as:  PRAVACHOL  take 1 tablet by mouth once daily     predniSONE 10 MG tablet  Commonly known as:   DELTASONE  Take 3 tabs daily x 3 days, 2 tabs daily x 3 days, 1 tab daily x 3 days then stop     Tiotropium Bromide-Olodaterol 2.5-2.5 MCG/ACT Aers  Commonly known as:  STIOLTO RESPIMAT  Inhale 2 puffs into the lungs daily.     trimethoprim 100 MG tablet  Commonly known as:  TRIMPEX  Reported on 09/06/2015     valsartan 160 MG tablet  Commonly known as:  DIOVAN  Take 1 tablet (160 mg total) by mouth daily.     VIGAMOX 0.5 % ophthalmic solution  Generic drug:  moxifloxacin  Reported on 09/06/2015        Allergies:  Allergies  Allergen Reactions  . Chlordiazepoxide-Clidinium Other (See Comments)    Sleepy,weak  . Biaxin [Clarithromycin]     Foul taste, abd pain, diarrhea  . Tramadol   . Codeine     REACTION: gi upset- only in high doses per pt.  Can tolerate cough syrup with codeine.     Past Medical History  Diagnosis Date  . Irritable bowel syndrome   . Other chronic nonalcoholic liver disease   . Esophageal reflux   . Allergic rhinitis, cause unspecified     Sinus CT Rec 12-23-2009  . 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 Sciatica   . Shingles 2010  . Steatohepatitis   . Diabetes mellitus   . Diverticulosis   . Hiatal hernia   . Esophageal stricture   . Diarrhea   . GERD (gastroesophageal reflux disease)   . Shortness of breath   . Hypertension   . Arthritis   . Glaucoma   . Complication of anesthesia     takes along time to wake up  . Oxygen deficiency   . Benign paroxysmal positional vertigo 08/09/2012  . Neuropathy in diabetes ([place]) 08/09/2012    Past Surgical History  Procedure Laterality Date  . Cholecystectomy open  1978  . Liver biopsy  08-1992  . Parathyroid exploration    . Tonsillectomy and adenoidectomy    . Total abdominal hysterectomy    . Esophagogastroduodenoscopy  7494,49-67    H Hernia,es.stricture s/p dil 86F  . Colonoscopy  07-2001    mild diverticulosis  . Carpal tunnel  release      right hand  . Cataract extraction      Bilateral  . Ulnar nerve transposition  12/07/2011    Procedure: ULNAR NERVE DECOMPRESSION/TRANSPOSITION;this was cancelled-not done  Surgeon: Cammie Sickle., MD;  Location: [facility];  Service: Orthopedics;  Laterality: Right;  right ulnar nerve in situ decompression  . Ulnar tunnel release  03/07/2012    Procedure: CUBITAL TUNNEL RELEASE;  Surgeon: Roseanne Kaufman, MD;  Location: [facility];  Service: Orthopedics;  Laterality: Right;  ulnar nerve release at the elbow    . Appendectomy    . Eye surgery      Family History  Problem Relation Age of Onset  . Lung cancer Mother     small cell;Byssinosis  . Heart disease Father   . Lung cancer Sister   . Liver cancer Sister     ? mets from another area of the body  . Diabetes  grandmother  . Stroke Maternal Grandfather     Social History:  reports that she quit smoking about 27 years ago. Her smoking use included Cigarettes. She has never used smokeless tobacco. She reports that she does not drink alcohol or use illicit drugs.  Review of Systems:   LOW back pain, left hip pain: She is not complaining as much, still has some pain and she thinks she has tried Celebrex but not clear if this is helpful, taking this intermittently only  Also complaining about a pain running down her left arm and hand with some tingling, previously also has had neck pain  HYPERTENSION:   blood pressure is well controlled and consistent now Generally controlled with  160 mg Diovan  She takes Xanax at night mostly and lorazepam in the daytime for anxiety  HYPERLIPIDEMIA:  The lipid abnormality consists of elevated LDL treated with pravastatin 20 mg only which she has no side effects from    Her last LDL is  below 100 Usually her triglycerides are increased but better now   Lab Results  Component Value Date   CHOL 147 08/05/2015   HDL 41.90 08/05/2015    LDLCALC 77 08/05/2015   LDLDIRECT 89.0 10/14/2014   TRIG 139.0 08/05/2015   CHOLHDL 4 08/05/2015    She is followed by pulmonologist for chronic COPD, taking home oxygen and symptoms are cough, dyspnea and recurrent bronchitis  Dizziness- has occasional dizzy spells of unclear etiology, Tends to feel off balance also   Diarrhea: She has had long-standing intermittent diarrhea possibly IBS. Is taking Questran as needed, recently not needing it CONSTIPATION: More recently is getting constipated without taking any Questran.  She does take Bentyl for abdominal cramps still She had some straining this weekend for her bowel movement and saw some blood.  NEUROPATHY: She has numbness in her feet.  Also complaining of some tingling and numbness in her hands Foot exam done in 8/15: Foot exam shows sensory loss distally, normal pulses and no skin lesions   Examination:   BP 130/64 mmHg  Pulse 85  Temp(Src) 97.9 F (36.6 C)  Resp 14  Ht 5' 3"  (1.6 m)  Wt 191 lb 3.2 oz (86.728 kg)  BMI 33.88 kg/m2  SpO2 94%  Body mass index is 33.88 kg/(m^2).     ASSESSMENT/ PLAN:   Diabetes type 2  See history of present illness for detailed discussion of his current management, blood sugar patterns and problems identified  The patient's diabetes control is  overall poor and requiring very large doses of insulin She continues to be reluctant to increase her mealtime insulin and proportion to her basal insulin Showed her her blood sugar patterns which show progressive increase in blood sugars from morning until night with some overnight using once a day Tresiba Explained to her that because of her insulin resistance she needs more mealtime insulin regardless of her intake She will increase her doses of Novolog by 4-8 units especially at suppertime and continued to increase until blood sugars at least under 200 later in the day  CONSTIPATION: Not clear of etiology and she can start using a stool softener  for now Recommended follow-up with GI doctor especially with her episode of bleeding with constipation but she is reluctant to do this She can also take Bentyl as needed only May also consider using MiraLAX She will do stool Hemoccults for now and decide on colonoscopy after this  LEFT arm pain and tingling: Like to be radiculopathy.  She continues to have various aches and pains otherwise also and have recommended referral to sports medicine  Pain in back and joints: she can try Celebrex more regularly  HYPERTENSION:  Good control  Hyperlipidemia: Well controlled  Counseling time over 50% of today's 25 minute visit on above topics   Patient Instructions  Novolog 14 at breakfast, 16-18 lunch and 20-22   May need 2-4 more in 1 week if sugars stay >200       Dayanna Pryce 09/07/2015, 9:00 PM

## 2015-09-07 ENCOUNTER — Telehealth: Payer: Self-pay | Admitting: *Deleted

## 2015-09-07 NOTE — Telephone Encounter (Signed)
Patient wants to know if you have put in a referral for her to see Dr. Tamala Julian for her hands? Does she need a referral?

## 2015-09-08 NOTE — Telephone Encounter (Signed)
Noted, she is aware and has an appointment on the 17th.

## 2015-09-08 NOTE — Telephone Encounter (Signed)
Yes

## 2015-09-15 ENCOUNTER — Telehealth: Payer: Self-pay | Admitting: *Deleted

## 2015-09-15 NOTE — Telephone Encounter (Signed)
Appears that the readings for 5/10 and 5/9 are flipped She can reduce her Tresiba to 80 units.   Blood sugars are much better now

## 2015-09-15 NOTE — Telephone Encounter (Signed)
Please see patients blood sugar readings and advise.  05/10 4:25 am 154 7:26 am- 157  1:09- 188 6:28 pm- 230 11:30 pm 192   05/09 3:06 pm- 201 1:41 pm- 167 12:50 pm- 73 9:26 am 227 6:53 am 107 3:14 am 115  05/08 3:57 am- 81

## 2015-09-16 ENCOUNTER — Encounter: Payer: Medicare Other | Admitting: Physical Medicine & Rehabilitation

## 2015-09-16 NOTE — Telephone Encounter (Signed)
Noted, patient is aware.

## 2015-09-20 ENCOUNTER — Telehealth: Payer: Self-pay | Admitting: *Deleted

## 2015-09-20 ENCOUNTER — Other Ambulatory Visit: Payer: Self-pay | Admitting: *Deleted

## 2015-09-20 MED ORDER — PROMETHAZINE HCL 25 MG PO TABS
25.0000 mg | ORAL_TABLET | Freq: Three times a day (TID) | ORAL | Status: DC | PRN
Start: 2015-09-20 — End: 2016-01-13

## 2015-09-20 NOTE — Telephone Encounter (Signed)
She can try promethazine 25 mg every 8 hours as needed. She can reduce her Tresiba by 10 units and NovoLog by 2-4 units if blood sugars are still low normal

## 2015-09-20 NOTE — Telephone Encounter (Signed)
Patient called this morning, she said she's been very sick and nauseated this weekend, she said she's not able to eat much because it won't stay down and that's making her sugars run low. Her stomach is very bloated.  She wants to know if you could call some medication to help with this. Please advise.

## 2015-09-20 NOTE — Telephone Encounter (Signed)
Noted, patient is aware, rx on your desk for signature.

## 2015-09-22 ENCOUNTER — Ambulatory Visit (INDEPENDENT_AMBULATORY_CARE_PROVIDER_SITE_OTHER): Payer: Medicare Other | Admitting: Family Medicine

## 2015-09-22 ENCOUNTER — Encounter: Payer: Self-pay | Admitting: Family Medicine

## 2015-09-22 VITALS — BP 132/72 | HR 80 | Ht 63.0 in | Wt 194.0 lb

## 2015-09-22 DIAGNOSIS — E1142 Type 2 diabetes mellitus with diabetic polyneuropathy: Secondary | ICD-10-CM

## 2015-09-22 DIAGNOSIS — M2550 Pain in unspecified joint: Secondary | ICD-10-CM | POA: Diagnosis not present

## 2015-09-22 NOTE — Assessment & Plan Note (Signed)
Patient does have significant osteoarthritic changes of multiple joints. I do think that this is secondary to patient's other comorbidities and decrease activity. Patient's diabetes and I think some peripheral neuropathy is also contributing but patient declined any type of medications including gabapentin at a think would be beneficial. Patient states that she would consider doing some vitamins given some suggestions. We discussed formal physical therapy but she thinks that this point she would not be strong enough to participate. I have to concur at this moment. We discussed with patient that this will be a slow process with some increasing pain probably before she gets better. We discussed we went to avoid any type of other surgical intervention secondary to her comorbidities. I do not feel that she would ever be a candidate for narcotics secondary to the amount of other problems especially her pulmonary disease. Patient will try to make these changes and come back again in 4-6 weeks. If feeling somewhat better we will get patient into formal physical therapy to start with strengthening.

## 2015-09-22 NOTE — Progress Notes (Signed)
Pre visit review using our clinic review tool, if applicable. No additional management support is needed unless otherwise documented below in the visit note. 

## 2015-09-22 NOTE — Assessment & Plan Note (Signed)
Declined gabapentin

## 2015-09-22 NOTE — Progress Notes (Signed)
[facility] [address] [place], [place] [address] Phone: [phone] Subjective:     CC: arthralgia  [id number] [name] is a 80 y.o. female coming in with complaint of arthralgia.multiple different joints of the herniated. Patient has had many different pains over the years. Patient has had multiple different surgeries. Please see patient past mental history significant workup that she has had already. Patient's chronic issues including diabetes with patient's most recent A1c of 10.2. Patient states that she has pain in the most every joint. Seems to move around. States it's thinking be debilitating. States that she would like to have narcotics but is concerned about taking them secondary to her pulmonary disease. Patient states that the pain though is unrelenting. States that she never has a day without pain. Rates the severity of 8 out of 10. Has recently been given Celebrex with some mild improvement. Patient states that she does sleep fairly comfortably but does have to get up multiple times to use the restroom and then sometimes something starts to hurt and it gives her enough pain that stops her from activities. States that the pain can radiate to her arms and sometimes has weakness. Thinks it's related to some of the neck pain she has.     Past Medical History  Diagnosis Date  . Irritable bowel syndrome   . Other chronic nonalcoholic liver disease   . Esophageal reflux   . Allergic rhinitis, cause unspecified     Sinus CT Rec 12-23-2009  . 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 Sciatica   . Shingles 2010  . Steatohepatitis   . Diabetes mellitus   . Diverticulosis   . Hiatal hernia   . Esophageal stricture   . Diarrhea   . GERD (gastroesophageal reflux disease)   . Shortness of breath   . Hypertension   . Arthritis   . Glaucoma   . Complication of anesthesia     takes along  time to wake up  . Oxygen deficiency   . Benign paroxysmal positional vertigo 08/09/2012  . Neuropathy in diabetes ([place]) 08/09/2012   Past Surgical History  Procedure Laterality Date  . Cholecystectomy open  1978  . Liver biopsy  08-1992  . Parathyroid exploration    . Tonsillectomy and adenoidectomy    . Total abdominal hysterectomy    . Esophagogastroduodenoscopy  6644,03-47    H Hernia,es.stricture s/p dil 69F  . Colonoscopy  07-2001    mild diverticulosis  . Carpal tunnel release      right hand  . Cataract extraction      Bilateral  . Ulnar nerve transposition  12/07/2011    Procedure: ULNAR NERVE DECOMPRESSION/TRANSPOSITION;this was cancelled-not done  Surgeon: Cammie Sickle., MD;  Location: [facility];  Service: Orthopedics;  Laterality: Right;  right ulnar nerve in situ decompression  . Ulnar tunnel release  03/07/2012    Procedure: CUBITAL TUNNEL RELEASE;  Surgeon: Roseanne Kaufman, MD;  Location: [facility];  Service: Orthopedics;  Laterality: Right;  ulnar nerve release at the elbow    . Appendectomy    . Eye surgery     Social History   Social History  . Marital Status: Single    Spouse Name: N/A  . Number of Children: N/A  . Years of Education: N/A   Occupational History  . Retired     Social History Main Topics  .  Smoking status: Former Smoker    Types: Cigarettes    Quit date: 05/08/1988  . Smokeless tobacco: Never Used  . Alcohol Use: No  . Drug Use: No  . Sexual Activity: No   Other Topics Concern  . None   Social History Narrative   Allergies  Allergen Reactions  . Chlordiazepoxide-Clidinium Other (See Comments)    Sleepy,weak  . Biaxin [Clarithromycin]     Foul taste, abd pain, diarrhea  . Tramadol   . Codeine     REACTION: gi upset- only in high doses per pt.  Can tolerate cough syrup with codeine.    Family History  Problem Relation Age of Onset  . Lung cancer Mother     small cell;Byssinosis  . Heart  disease Father   . Lung cancer Sister   . Liver cancer Sister     ? mets from another area of the body  . Diabetes      grandmother  . Stroke Maternal Grandfather     Past medical history, social, surgical and family history all reviewed in electronic medical record.  No pertanent information unless stated regarding to the chief complaint.   Review of Systems: No headache, visual changes, nausea, vomiting, diarrhea, constipation, dizziness, abdominal pain, skin rash, fevers, chills, night sweats, weight loss, swollen lymph nodes,, , mood changes.   Objective Blood pressure 132/72, pulse 80, height 5' 3"  (1.6 m), weight 194 lb (87.998 kg), SpO2 95 %.  General: No apparent distress alert and oriented x3 mood and affect normal, dressed appropriately. Patient is on oxygen HEENT: Pupils equal, extraocular movements intact  Respiratory: Patient's speak in full sentences mildly short of breath Cardiovascular: +1 lower extremity edema, non tender, no erythema  Skin: Warm dry intact with no signs of infection or rash on extremities or on axial skeleton. Dry skin noted Abdomen: Soft nontender  Neuro: Cranial nerves II through XII are intact, neurovascularly intact in all extremities with 2+ DTRs and 2+ pulses.  Lymph: No lymphadenopathy of posterior or anterior cervical chain or axillae bilaterally.  Gait normal with good balance and coordination.  MSK:  Non tender with full range of motion and good stability and symmetric strength and tone of shoulders, elbows, wrist, hip, knee and ankles bilaterally. Significant arthritic changes of multiple joints. Patient has diffuse tenderness of multiple joints. Patient does have arthritic changes of the hands. Range of motion of patient shoulders to have arthritic crepitus noted but does have full range of motion.   Impression and Recommendations:     This case required medical decision making of moderate complexity.      Note: This dictation was  prepared with Dragon dictation along with smaller phrase technology. Any transcriptional errors that result from this process are unintentional.

## 2015-09-22 NOTE — Patient Instructions (Signed)
Good to see you  You have bad arthritis and I think the diabetes causes significant arthritis and pain Ice 20 minutes 2 times daily. Usually after activity and before bed. pennsaid pinkie amount topically 2 times daily as needed.  Vitamin D 2000 IU daily  Turmeric 541m daily  Glucosamine 15041mdaily  Tart cherry extract at night any dose.  If you can try to stay active.  We will not get you pain free but can get you a little better hopefully.  See me again in 4-6 weeks and then we will discuss if feeling good enough to add PT.

## 2015-09-23 ENCOUNTER — Telehealth: Payer: Self-pay | Admitting: Endocrinology

## 2015-09-23 NOTE — Telephone Encounter (Signed)
Please call pt about doctor she just saw and he put her on a lot of vitamins, she wants to let us know what they are and if she is ok to take them

## 2015-09-24 ENCOUNTER — Other Ambulatory Visit: Payer: Medicare Other

## 2015-09-24 NOTE — Telephone Encounter (Signed)
Pt advised.

## 2015-09-24 NOTE — Telephone Encounter (Signed)
Please call pt asap regarding vitamins

## 2015-09-24 NOTE — Telephone Encounter (Signed)
There is no problem trying the supplements

## 2015-09-24 NOTE — Telephone Encounter (Signed)
I contacted the pt. Pt went to see Dr. Tamala Julian on 09/22/2015. During the office visit Dr. Tamala Julian recommend for her to start taking tumeric, glucosamine, tart cherry extract, and pennsaid. Pt stated she wanted to consult with you before starting these supplements because she does not want the supplements to interfere with any of her current health issues or current medications. Please advise, Thanks!

## 2015-09-27 ENCOUNTER — Other Ambulatory Visit: Payer: Self-pay | Admitting: *Deleted

## 2015-09-27 ENCOUNTER — Other Ambulatory Visit: Payer: Self-pay | Admitting: Endocrinology

## 2015-09-27 ENCOUNTER — Other Ambulatory Visit (INDEPENDENT_AMBULATORY_CARE_PROVIDER_SITE_OTHER): Payer: Medicare Other

## 2015-09-27 DIAGNOSIS — Z1211 Encounter for screening for malignant neoplasm of colon: Secondary | ICD-10-CM

## 2015-09-27 LAB — FECAL OCCULT BLOOD, IMMUNOCHEMICAL: FECAL OCCULT BLD: NEGATIVE

## 2015-09-29 ENCOUNTER — Telehealth: Payer: Self-pay | Admitting: Internal Medicine

## 2015-09-29 DIAGNOSIS — J449 Chronic obstructive pulmonary disease, unspecified: Secondary | ICD-10-CM

## 2015-09-29 MED ORDER — PREDNISONE 10 MG PO TABS: ORAL_TABLET | ORAL | Status: DC

## 2015-09-29 MED ORDER — AMOXICILLIN 500 MG PO TABS: 500.0000 mg | ORAL_TABLET | Freq: Three times a day (TID) | ORAL | Status: DC

## 2015-09-29 NOTE — Telephone Encounter (Signed)
Pt aware of rec's per CY - Rxs sent to [facility].   Please advise CY, Pt also wanting a POC, something light weight, battery operated that she can carry on her shoulder.

## 2015-09-29 NOTE — Telephone Encounter (Signed)
Ok to send Rx to her DME for light portable oxygen concentrator 2 L/Min.  Dx COPD mixed type. She will have to see what they can do that's light enough to carry.

## 2015-09-29 NOTE — Telephone Encounter (Signed)
Patient wants the Inogen, battery operated lightweight portable oxygen concentrator, she said that she cannot get it through [facility] because they do not sell Inogen.   Called and spoke with Melissa at [facility] and she said that patient would have to pay full price for an Inogen concentrator because her insurance will not pay for it.  She said that they have a Simply Go Mini that is battery operated and weighs 5lbs. She said that we would need to put in an order for patient to have Evaluation for Simply Go Mini.    Called patient and discussed options with patient, she said that she would like to try the simply go Mini.  Sent order.  Patient aware. Nothing further needed.

## 2015-09-29 NOTE — Telephone Encounter (Signed)
Pt c/o worsening cough and mucus is darkening x 1 week, with increased SOB.  Pt using all inhalers as prescribed. Pt notes some chest tightness, runny nose and back pain in mid in between shoulder blades.  Pt uses Tessalon Perles 258m - having to take 2-3 daily for the cough Pt states that the last combo she was prescribed 07/14/15 of Amoxicillin 5058mand Prednisone taper worked great.   Pt also wanting a POC, something light weight, battery operated that she can carry on her shoulder. CY, please advise.   Allergies  Allergen Reactions  . Chlordiazepoxide-Clidinium Other (See Comments)    Sleepy,weak  . Biaxin [Clarithromycin]     Foul taste, abd pain, diarrhea  . Tramadol   . Codeine     REACTION: gi upset- only in high doses per pt.  Can tolerate cough syrup with codeine.       Medication List       This list is accurate as of: 09/29/15 10:50 AM.  Always use your most recent med list.               acetaminophen 500 MG tablet  Commonly known as:  TYLENOL  Take 500 mg by mouth every 6 (six) hours as needed for pain.     ALPRAZolam 0.5 MG tablet  Commonly known as:  XANAX  take 1/2 to 1 tablet by mouth twice a day     aspirin 325 MG tablet  Take 325 mg by mouth daily.     B-D UF III MINI PEN NEEDLES 31G X 5 MM Misc  Generic drug:  Insulin Pen Needle  USE 5 PER DAY TO INJECT INSULIN     bacitracin ophthalmic ointment  Reported on 09/06/2015     benzonatate 200 MG capsule  Commonly known as:  TESSALON  Take 1 capsule (200 mg total) by mouth 3 (three) times daily as needed for cough.     celecoxib 100 MG capsule  Commonly known as:  CELEBREX  Take 1 capsule (100 mg total) by mouth 2 (two) times daily.     cholestyramine 4 g packet  Commonly known as:  QUESTRAN  Take one package up to 2times a day as needed for diarrhea.     dicyclomine 10 MG capsule  Commonly known as:  BENTYL  take 1 capsule by mouth once daily     DULoxetine 30 MG capsule  Commonly known as:   CYMBALTA  Take 1 capsule (30 mg total) by mouth daily.     glucose blood test strip  Commonly known as:  ACCU-CHEK AVIVA PLUS  Use as instructed to check blood sugars 8 times per day dx code 250.02     insulin aspart 100 UNIT/ML FlexPen  Commonly known as:  NOVOLOG FLEXPEN  Inject 20 units three times a day     Insulin Degludec 200 UNIT/ML Sopn  Commonly known as:  TRESIBA FLEXTOUCH  Inject 94 Units into the skin daily.     Insulin Syringe-Needle U-100 31G X 15/64" 0.5 ML Misc  Commonly known as:  BD INSULIN SYRINGE ULTRAFINE  Use 2 per day dx code E11.65     ipratropium 17 MCG/ACT inhaler  Commonly known as:  ATROVENT HFA  Inhale 2 puffs into the lungs 2 (two) times daily as needed (shortness of breath).     levalbuterol 45 MCG/ACT inhaler  Commonly known as:  XOPENEX HFA  Inhale 2 puffs into the lungs every 6 (six) hours as needed for wheezing  or shortness of breath.     magic mouthwash Soln  Swish with 2 teaspoonfuls by mouth as needed     meclizine 25 MG tablet  Commonly known as:  ANTIVERT  take 1 tablet by mouth three times a day if needed for dizziness     methocarbamol 500 MG tablet  Commonly known as:  ROBAXIN  Take 1 tablet 3 times per day as needed for 10 days     metoprolol succinate 25 MG 24 hr tablet  Commonly known as:  TOPROL-XL  take 1 tablet by mouth once daily     omeprazole 40 MG capsule  Commonly known as:  PRILOSEC  Take 1 capsule (40 mg total) by mouth daily.     OXYGEN  3-3.5 liters O2 pulsed     pravastatin 20 MG tablet  Commonly known as:  PRAVACHOL  take 1 tablet by mouth once daily     promethazine 25 MG tablet  Commonly known as:  PHENERGAN  Take 1 tablet (25 mg total) by mouth every 8 (eight) hours as needed for nausea or vomiting.     Tiotropium Bromide-Olodaterol 2.5-2.5 MCG/ACT Aers  Commonly known as:  STIOLTO RESPIMAT  Inhale 2 puffs into the lungs daily.     trimethoprim 100 MG tablet  Commonly known as:  TRIMPEX   Reported on 09/06/2015     valsartan 160 MG tablet  Commonly known as:  DIOVAN  Take 1 tablet (160 mg total) by mouth daily.     VIGAMOX 0.5 % ophthalmic solution  Generic drug:  moxifloxacin  Reported on 09/06/2015       Please advise Dr Annamaria Boots. Thanks.

## 2015-09-29 NOTE — Telephone Encounter (Signed)
Offer Amoxacillin 500 mg, # 21      1 tab three times daily    Ref x 1           Prednisone 10 mg, # 20, 4 X 2 DAYS, 3 X 2 DAYS, 2 X 2 DAYS, 1 X 2 DAYS   Ref x 1

## 2015-10-07 ENCOUNTER — Encounter: Payer: Self-pay | Admitting: Endocrinology

## 2015-10-07 ENCOUNTER — Ambulatory Visit (INDEPENDENT_AMBULATORY_CARE_PROVIDER_SITE_OTHER): Payer: Medicare Other | Admitting: Endocrinology

## 2015-10-07 VITALS — BP 134/66 | HR 83 | Temp 98.2°F | Resp 16 | Ht 63.0 in | Wt 192.4 lb

## 2015-10-07 DIAGNOSIS — Z794 Long term (current) use of insulin: Secondary | ICD-10-CM | POA: Diagnosis not present

## 2015-10-07 DIAGNOSIS — E1142 Type 2 diabetes mellitus with diabetic polyneuropathy: Secondary | ICD-10-CM | POA: Diagnosis not present

## 2015-10-07 DIAGNOSIS — E1165 Type 2 diabetes mellitus with hyperglycemia: Secondary | ICD-10-CM | POA: Diagnosis not present

## 2015-10-07 DIAGNOSIS — I10 Essential (primary) hypertension: Secondary | ICD-10-CM | POA: Diagnosis not present

## 2015-10-07 NOTE — Patient Instructions (Addendum)
Tresiba  74 unitil am sugar is under 140  NOVOLOG WITH PREDNISONE: 18 at breakfast, 22 lunch and 24 at supper   Then go back to Novolog 16 at breakfast, 18-20  lunch and 22-24 supper    Wear sandals next time

## 2015-10-07 NOTE — Progress Notes (Signed)
Patient ID: [name], female   DOB: 1935/06/06, 80 y.o.   MRN: [id number]   Reason for Appointment: follow-up of multiple problems  History of Present Illness    DIABETES type II since 1980   She has been on insulin using Lantus and Humalog for several years and usually A1c around 8% despite fairly good compliance with diet, exercise and glucose monitoring. She also had been on Actos previously which probably benefited her especially with her fatty liver but she stopped it because of fear of side effects      Recent history:   Insulin regimen: Tresiba  70 acs with and Novolog 14 at breakfast, 16-18 lunch and 20-22   Her A1c has been consistently around 9- 10%  Her A1c was at the highest level in the long time at 10.2 in 3/17 She was changed from Lantus to [place] insulin and this was recently decreased to 70 units, previously on 94 units of Lantus daily  She does have the following patterns:  Her morning readings are still not consistent although the lowest point of the day  She was afraid of getting low blood sugars and her insulin dose was reduced about 3 weeks ago  However with getting prednisone last week her blood sugars have been as high as 443 and high in the morning also; she did not increase her insulin when her blood sugars were higher  She again has variable blood sugars with average glucose over 200 most of the day except overnight and fasting  She has had some increased hunger with taking prednisone, otherwise usually watching portions  She still checks her sugars about 5 times a day  No objective evaluation of her diabetes done recently  Oral hypoglycemic drugs: none       Side effects from medications: None         Proper timing of medications in relation to meals: Yes.         Monitors blood glucose:  5 times a day   Glucometer:  Accu-Chek         Blood Glucose averages recently from meter download recently :  Mean values apply above for all  meters except median for One Touch  PRE-MEAL Fasting Lunch Dinner Bedtime Overall  Glucose range: 132-308  113-376  134-358  169-443    Mean/median: 155  213  222  205  215+/-75    Overnight average 187  Meals: 3 meals per day.  Smaller portions; lunch 1-2 pm Supper 6 to 7 PM     Physical activity: exercise: Unable to do any because of dyspnea and fatigue            Dietician visit: Most recent: Several years ago           Complications: are: Peripheral neuropathy with sensory loss     Wt Readings from Last 3 Encounters:  10/07/15 192 lb 6.4 oz (87.272 kg)  09/22/15 194 lb (87.998 kg)  09/06/15 191 lb 3.2 oz (86.728 kg)    Lab Results  Component Value Date   HGBA1C 10.2 08/05/2015   HGBA1C 9.6 04/26/2015   HGBA1C 9.9* 12/11/2014   Lab Results  Component Value Date   MICROALBUR 6.9* 10/14/2014   LDLCALC 77 08/05/2015   CREATININE 0.68 08/05/2015     OTHER problems  are detailed in review of systems     Medication List       This list is accurate as of: 10/07/15 11:59 PM.  Always use your most recent med list.               acetaminophen 500 MG tablet  Commonly known as:  TYLENOL  Take 500 mg by mouth every 6 (six) hours as needed for pain.     ALPRAZolam 0.5 MG tablet  Commonly known as:  XANAX  take 1/2 to 1 tablet by mouth twice a day     amoxicillin 500 MG tablet  Commonly known as:  AMOXIL  Take 1 tablet (500 mg total) by mouth 3 (three) times daily.     aspirin 325 MG tablet  Take 325 mg by mouth daily.     B-D UF III MINI PEN NEEDLES 31G X 5 MM Misc  Generic drug:  Insulin Pen Needle  USE 5 PER DAY TO INJECT INSULIN     bacitracin ophthalmic ointment  Reported on 09/06/2015     benzonatate 200 MG capsule  Commonly known as:  TESSALON  Take 1 capsule (200 mg total) by mouth 3 (three) times daily as needed for cough.     celecoxib 100 MG capsule  Commonly known as:  CELEBREX  Take 1 capsule (100 mg total) by mouth 2 (two) times daily.      cholestyramine 4 g packet  Commonly known as:  QUESTRAN  Take one package up to 2times a day as needed for diarrhea.     dicyclomine 10 MG capsule  Commonly known as:  BENTYL  take 1 capsule by mouth once daily     DULoxetine 30 MG capsule  Commonly known as:  CYMBALTA  Take 1 capsule (30 mg total) by mouth daily.     glucose blood test strip  Commonly known as:  ACCU-CHEK AVIVA PLUS  Use as instructed to check blood sugars 8 times per day dx code 250.02     insulin aspart 100 UNIT/ML FlexPen  Commonly known as:  NOVOLOG FLEXPEN  Inject 20 units three times a day     Insulin Degludec 200 UNIT/ML Sopn  Commonly known as:  TRESIBA FLEXTOUCH  Inject 94 Units into the skin daily.     Insulin Syringe-Needle U-100 31G X 15/64" 0.5 ML Misc  Commonly known as:  BD INSULIN SYRINGE ULTRAFINE  Use 2 per day dx code E11.65     ipratropium 17 MCG/ACT inhaler  Commonly known as:  ATROVENT HFA  Inhale 2 puffs into the lungs 2 (two) times daily as needed (shortness of breath).     levalbuterol 45 MCG/ACT inhaler  Commonly known as:  XOPENEX HFA  Inhale 2 puffs into the lungs every 6 (six) hours as needed for wheezing or shortness of breath.     magic mouthwash Soln  Swish with 2 teaspoonfuls by mouth as needed     meclizine 25 MG tablet  Commonly known as:  ANTIVERT  take 1 tablet by mouth three times a day if needed for dizziness     methocarbamol 500 MG tablet  Commonly known as:  ROBAXIN  Take 1 tablet 3 times per day as needed for 10 days     metoprolol succinate 25 MG 24 hr tablet  Commonly known as:  TOPROL-XL  take 1 tablet by mouth once daily     omeprazole 40 MG capsule  Commonly known as:  PRILOSEC  Take 1 capsule (40 mg total) by mouth daily.     omeprazole 20 MG capsule  Commonly known as:  PRILOSEC     OXYGEN  3-3.5  liters O2 pulsed     pravastatin 20 MG tablet  Commonly known as:  PRAVACHOL  take 1 tablet by mouth once daily     predniSONE 10 MG tablet    Commonly known as:  DELTASONE  Take 4 tabs po x 2 days, then 3 x 2 days, then 2 x 2 days, then 1 x 2 days then stop.     promethazine 25 MG tablet  Commonly known as:  PHENERGAN  Take 1 tablet (25 mg total) by mouth every 8 (eight) hours as needed for nausea or vomiting.     Tiotropium Bromide-Olodaterol 2.5-2.5 MCG/ACT Aers  Commonly known as:  STIOLTO RESPIMAT  Inhale 2 puffs into the lungs daily.     trimethoprim 100 MG tablet  Commonly known as:  TRIMPEX  Reported on 09/06/2015     valsartan 160 MG tablet  Commonly known as:  DIOVAN  Take 1 tablet (160 mg total) by mouth daily.     VIGAMOX 0.5 % ophthalmic solution  Generic drug:  moxifloxacin  Reported on 09/06/2015        Allergies:  Allergies  Allergen Reactions  . Chlordiazepoxide-Clidinium Other (See Comments)    Sleepy,weak  . Biaxin [Clarithromycin]     Foul taste, abd pain, diarrhea  . Tramadol   . Codeine     REACTION: gi upset- only in high doses per pt.  Can tolerate cough syrup with codeine.     Past Medical History  Diagnosis Date  . Irritable bowel syndrome   . Other chronic nonalcoholic liver disease   . Esophageal reflux   . Allergic rhinitis, cause unspecified     Sinus CT Rec 12-23-2009  . 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 Sciatica   . Shingles 2010  . Steatohepatitis   . Diabetes mellitus   . Diverticulosis   . Hiatal hernia   . Esophageal stricture   . Diarrhea   . GERD (gastroesophageal reflux disease)   . Shortness of breath   . Hypertension   . Arthritis   . Glaucoma   . Complication of anesthesia     takes along time to wake up  . Oxygen deficiency   . Benign paroxysmal positional vertigo 08/09/2012  . Neuropathy in diabetes ([place]) 08/09/2012    Past Surgical History  Procedure Laterality Date  . Cholecystectomy open  1978  . Liver biopsy  08-1992  . Parathyroid exploration    . Tonsillectomy and adenoidectomy    .  Total abdominal hysterectomy    . Esophagogastroduodenoscopy  8502,77-41    H Hernia,es.stricture s/p dil 58F  . Colonoscopy  07-2001    mild diverticulosis  . Carpal tunnel release      right hand  . Cataract extraction      Bilateral  . Ulnar nerve transposition  12/07/2011    Procedure: ULNAR NERVE DECOMPRESSION/TRANSPOSITION;this was cancelled-not done  Surgeon: Cammie Sickle., MD;  Location: [facility];  Service: Orthopedics;  Laterality: Right;  right ulnar nerve in situ decompression  . Ulnar tunnel release  03/07/2012    Procedure: CUBITAL TUNNEL RELEASE;  Surgeon: Roseanne Kaufman, MD;  Location: [facility];  Service: Orthopedics;  Laterality: Right;  ulnar nerve release at the elbow    . Appendectomy    . Eye surgery      Family History  Problem Relation Age of Onset  . Lung cancer Mother  small cell;Byssinosis  . Heart disease Father   . Lung cancer Sister   . Liver cancer Sister     ? mets from another area of the body  . Diabetes      grandmother  . Stroke Maternal Grandfather     Social History:  reports that she quit smoking about 27 years ago. Her smoking use included Cigarettes. She has never used smokeless tobacco. She reports that she does not drink alcohol or use illicit drugs.  Review of Systems:  LOW back pain, left hip pain: She is still having symptoms, not clear if she is benefiting from Celebrex but tries to take this only once a day for fear of GI side effects She had various other musculoskeletal pains and is trying some nutritional supplements from sports medicine specialist  HYPERTENSION:   blood pressure is well controlled and consistent  She is compliant with  160 mg Diovan  She takes Xanax at night mostly and lorazepam in the daytime for anxiety  HYPERLIPIDEMIA:  The lipid abnormality consists of elevated LDL treated with pravastatin 20 mg only which she has no side effects from    Her last LDL is  below  100 Usually her triglycerides are increased but better now   Lab Results  Component Value Date   CHOL 147 08/05/2015   HDL 41.90 08/05/2015   LDLCALC 77 08/05/2015   LDLDIRECT 89.0 10/14/2014   TRIG 139.0 08/05/2015   CHOLHDL 4 08/05/2015    She is followed by pulmonologist for chronic COPD, taking home oxygen and symptoms are cough, dyspnea and recurrent bronchitis Recently given prednisone for cough  Dizziness- has occasional dizzy spells of unclear etiology, Tends to feel off balance also   Diarrhea: She has had long-standing intermittent diarrhea possibly IBS. Is taking Questran as needed, recently not needing it CONSTIPATION: More recently is getting constipated without taking any Questran.  She does take Bentyl for abdominal cramps still She had some straining this weekend for her bowel movement and saw some blood.  NEUROPATHY: She has numbness in her feet.  Also has tingling and numbness in her hands Foot exam done in 8/15: Foot exam shows sensory loss distally, normal pulses and no skin lesions   Examination:   BP 134/66 mmHg  Pulse 83  Temp(Src) 98.2 F (36.8 C)  Resp 16  Ht 5' 3"  (1.6 m)  Wt 192 lb 6.4 oz (87.272 kg)  BMI 34.09 kg/m2  SpO2 95%  Body mass index is 34.09 kg/(m^2).     ASSESSMENT/ PLAN:   Diabetes type 2  See history of present illness for detailed discussion of his current management, blood sugar patterns and problems identified  The patient's diabetes control is overall poor and requiring very large doses of insulin She probably has had somewhat lower insulin requirement with [place] compared to Lantus and somewhat better fasting readings as also needing only once a day basal She continues to be reluctant to increase her mealtime insulin even when blood sugars are high She has had higher readings with prednisone recently and also may be eating more from increased appetite  She will need to increase both her basal and bolus insulins now and  given her instructions in writing She probably can continue 34 Tresiba unless fasting readings are low normal She will call if blood sugars are not consistently controlled  She does need of foot exam, she wants to do it next time  GI symptoms: Improved recently  Pain in back and  joints: she can try Celebrex twice a day on the days she is having more pain  HYPERTENSION:  Good control   Counseling time over 50% of today's 25 minute visit on above topics   Patient Instructions  Tresiba  74 unitil am sugar is under [place]: 18 at breakfast, 22 lunch and 24 at supper   Then go back to Novolog 16 at breakfast, 18-20  lunch and 22-24 supper    Wear sandals next time     [facility] 10/08/2015, 8:28 AM

## 2015-10-12 ENCOUNTER — Telehealth: Payer: Self-pay | Admitting: *Deleted

## 2015-10-12 NOTE — Telephone Encounter (Signed)
[name] said she took 57 units of her Tyler Aas, when she checked her sugar before bedtime (11 pm) her sugar was 144, She woke up about 2 am and her sugar was 74, she got some peanut butter and went back to bed. When she woke up at 7ish her sugar was 83.  She ate something and her sugar is now 157.  She has not taken her novolog yet this morning, and she is off the prednisone now, yesterday was her last day.

## 2015-10-12 NOTE — Telephone Encounter (Signed)
Noted, detailed message left on patients voice mail per patient request.

## 2015-10-12 NOTE — Telephone Encounter (Signed)
Noted, she is aware.

## 2015-10-12 NOTE — Telephone Encounter (Signed)
She can reduce her Tyler Aas down to 65 units.  May be getting tremor because of her relatively lower blood sugars.  We can have her see the neurologist if needed for tremors

## 2015-10-12 NOTE — Telephone Encounter (Signed)
Due to the syringe she can only take 64 or 66

## 2015-10-12 NOTE — Telephone Encounter (Signed)
She also said she is having tremors again and she checked her blood pressure and it was 151/56.

## 2015-10-15 ENCOUNTER — Telehealth: Payer: Self-pay | Admitting: Endocrinology

## 2015-10-15 NOTE — Telephone Encounter (Signed)
Pt scheduled for 10/18/2015

## 2015-10-15 NOTE — Telephone Encounter (Signed)
Spoke with patient,scheduled appointment for Monday states she will call back and reschedule if the am appointment will not work for transportation.

## 2015-10-15 NOTE — Telephone Encounter (Signed)
See note below and please advise, Thanks! 

## 2015-10-15 NOTE — Telephone Encounter (Signed)
Pt getting weaker, just took bp 130/55 pulse 82  Sugar 138 this am

## 2015-10-15 NOTE — Telephone Encounter (Signed)
She can come in to be seen if needed.

## 2015-10-18 ENCOUNTER — Other Ambulatory Visit: Payer: Self-pay

## 2015-10-18 ENCOUNTER — Ambulatory Visit (INDEPENDENT_AMBULATORY_CARE_PROVIDER_SITE_OTHER): Payer: Medicare Other | Admitting: Endocrinology

## 2015-10-18 ENCOUNTER — Encounter: Payer: Self-pay | Admitting: Endocrinology

## 2015-10-18 VITALS — BP 154/84 | HR 101 | Ht 63.0 in | Wt 197.0 lb

## 2015-10-18 DIAGNOSIS — E118 Type 2 diabetes mellitus with unspecified complications: Secondary | ICD-10-CM | POA: Diagnosis not present

## 2015-10-18 DIAGNOSIS — R252 Cramp and spasm: Secondary | ICD-10-CM | POA: Diagnosis not present

## 2015-10-18 DIAGNOSIS — R531 Weakness: Secondary | ICD-10-CM

## 2015-10-18 DIAGNOSIS — E1165 Type 2 diabetes mellitus with hyperglycemia: Secondary | ICD-10-CM | POA: Diagnosis not present

## 2015-10-18 DIAGNOSIS — G609 Hereditary and idiopathic neuropathy, unspecified: Secondary | ICD-10-CM | POA: Diagnosis not present

## 2015-10-18 LAB — CBC WITH DIFFERENTIAL/PLATELET
BASOS PCT: 0.6 % (ref 0.0–3.0)
Basophils Absolute: 0.1 10*3/uL (ref 0.0–0.1)
EOS ABS: 0.3 10*3/uL (ref 0.0–0.7)
EOS PCT: 3.4 % (ref 0.0–5.0)
HCT: 41.4 % (ref 36.0–46.0)
Hemoglobin: 13.3 g/dL (ref 12.0–15.0)
LYMPHS ABS: 2.9 10*3/uL (ref 0.7–4.0)
Lymphocytes Relative: 34.7 % (ref 12.0–46.0)
MCHC: 32.1 g/dL (ref 30.0–36.0)
MCV: 83.3 fl (ref 78.0–100.0)
MONO ABS: 0.4 10*3/uL (ref 0.1–1.0)
Monocytes Relative: 4.9 % (ref 3.0–12.0)
NEUTROS ABS: 4.7 10*3/uL (ref 1.4–7.7)
NEUTROS PCT: 56.4 % (ref 43.0–77.0)
PLATELETS: 162 10*3/uL (ref 150.0–400.0)
RBC: 4.97 Mil/uL (ref 3.87–5.11)
RDW: 15.4 % (ref 11.5–15.5)
WBC: 8.4 10*3/uL (ref 4.0–10.5)

## 2015-10-18 LAB — COMPREHENSIVE METABOLIC PANEL
ALBUMIN: 4 g/dL (ref 3.5–5.2)
ALT: 27 U/L (ref 0–35)
AST: 31 U/L (ref 0–37)
Alkaline Phosphatase: 44 U/L (ref 39–117)
BILIRUBIN TOTAL: 0.3 mg/dL (ref 0.2–1.2)
BUN: 13 mg/dL (ref 6–23)
CALCIUM: 9.2 mg/dL (ref 8.4–10.5)
CHLORIDE: 98 meq/L (ref 96–112)
CO2: 32 meq/L (ref 19–32)
Creatinine, Ser: 0.62 mg/dL (ref 0.40–1.20)
GFR: 98.86 mL/min (ref >60.00)
Glucose, Bld: 201 mg/dL — ABNORMAL HIGH (ref 70–99)
Potassium: 4.6 mEq/L (ref 3.5–5.1)
Sodium: 136 mEq/L (ref 135–145)
Total Protein: 7 g/dL (ref 6.0–8.3)

## 2015-10-18 LAB — TSH: TSH: 2.02 u[IU]/mL (ref 0.35–4.50)

## 2015-10-18 LAB — MAGNESIUM: MAGNESIUM: 1.7 mg/dL (ref 1.5–2.5)

## 2015-10-18 LAB — VITAMIN B12: VITAMIN B 12: 340 pg/mL (ref 211–911)

## 2015-10-18 MED ORDER — PREDNISONE 5 MG PO TABS: ORAL_TABLET | ORAL | Status: DC

## 2015-10-18 MED ORDER — INSULIN ASPART 100 UNIT/ML FLEXPEN: PEN_INJECTOR | SUBCUTANEOUS | Status: DC

## 2015-10-18 NOTE — Progress Notes (Signed)
Patient ID: [name], female   DOB: [birth year], 80 y.o.   MRN: [id number]   Reason for Appointment: weakness and follow-up of multiple problems  History of Present Illness    PROBLEM 1: Weakness and associated symptoms  She had called last week to report that she was feeling persistently week and at times would get shaky She says she has had tendency of feeling weak on and off for quite some time but is now persistent.  She has not been able to do much because of the weakness She also says that she feels shaky inside but occasionally at her hands will shake also line this is somewhat worse than before  She was on prednisone until recently and this was stopped She does not feel lightheaded or faint when standing up  She is also asking about the time she feels nervous and gets shaken up very quickly with any excessive noise or emotional change.  Not having panic attacks.  Takes Xanax in the mornings usually and once at night if unable to sleep   DIABETES type II since 1980   She has been on insulin using Lantus and Humalog for several years and usually A1c around 8% despite fairly good compliance with diet, exercise and glucose monitoring. She also had been on Actos previously which probably benefited her especially with her fatty liver but she stopped it because of fear of side effects      Recent history:   Insulin regimen: Tresiba  66 acs with and Novolog 14 at breakfast, 16-18 lunch and 20-22   Her A1c has been consistently around 9- 10%  Her A1c was at the highest level in the long time at 10.2 in 3/17 She was changed from Lantus to [place] insulin and this was recently decreased to 66 units, previously on 94 units of Lantus daily Her dose was reduced further when she got off of prednisone  Oral hypoglycemic drugs: none       Side effects from medications: None         Proper timing of medications in relation to meals: Yes.         Monitors blood glucose:  5  times a day   Glucometer:  Accu-Chek         Blood Glucose averages recently from meter download recently show overall average 201 with a range of 74-443 for the last month and highest readings before and after supper :   Meals: 3 meals per day.  Smaller portions; lunch 1-2 pm Supper 6 to 7 PM     Physical activity: exercise: Unable to do any because of dyspnea and fatigue            Dietician visit: Most recent: Several years ago           Complications: are: Peripheral neuropathy with sensory loss     Wt Readings from Last 3 Encounters:  10/18/15 197 lb (89.359 kg)  10/07/15 192 lb 6.4 oz (87.272 kg)  09/22/15 194 lb (87.998 kg)    Lab Results  Component Value Date   HGBA1C 10.2 08/05/2015   HGBA1C 9.6 04/26/2015   HGBA1C 9.9* 12/11/2014   Lab Results  Component Value Date   MICROALBUR 6.9* 10/14/2014   LDLCALC 77 08/05/2015   CREATININE 0.68 08/05/2015     OTHER problems  are detailed in review of systems     Medication List       This list is accurate as of: 10/18/15  3:04 PM.  Always use your most recent med list.               acetaminophen 500 MG tablet  Commonly known as:  TYLENOL  Take 500 mg by mouth every 6 (six) hours as needed for pain.     ALPRAZolam 0.5 MG tablet  Commonly known as:  XANAX  take 1/2 to 1 tablet by mouth twice a day     amoxicillin 500 MG tablet  Commonly known as:  AMOXIL  Take 1 tablet (500 mg total) by mouth 3 (three) times daily.     aspirin 325 MG tablet  Take 325 mg by mouth daily.     B-D UF III MINI PEN NEEDLES 31G X 5 MM Misc  Generic drug:  Insulin Pen Needle  USE 5 PER DAY TO INJECT INSULIN     bacitracin ophthalmic ointment  Reported on 09/06/2015     benzonatate 200 MG capsule  Commonly known as:  TESSALON  Take 1 capsule (200 mg total) by mouth 3 (three) times daily as needed for cough.     celecoxib 100 MG capsule  Commonly known as:  CELEBREX  Take 1 capsule (100 mg total) by mouth 2 (two) times daily.       cholestyramine 4 g packet  Commonly known as:  QUESTRAN  Take one package up to 2times a day as needed for diarrhea.     dicyclomine 10 MG capsule  Commonly known as:  BENTYL  take 1 capsule by mouth once daily     glucose blood test strip  Commonly known as:  ACCU-CHEK AVIVA PLUS  Use as instructed to check blood sugars 8 times per day dx code 250.02     insulin aspart 100 UNIT/ML FlexPen  Commonly known as:  NOVOLOG FLEXPEN  Inject 3 times daily with meals. 18 units with breakfast 22 units with lunch and 24 units with supper.     Insulin Degludec 200 UNIT/ML Sopn  Commonly known as:  TRESIBA FLEXTOUCH  Inject 94 Units into the skin daily.     Insulin Syringe-Needle U-100 31G X 15/64" 0.5 ML Misc  Commonly known as:  BD INSULIN SYRINGE ULTRAFINE  Use 2 per day dx code E11.65     ipratropium 17 MCG/ACT inhaler  Commonly known as:  ATROVENT HFA  Inhale 2 puffs into the lungs 2 (two) times daily as needed (shortness of breath).     levalbuterol 45 MCG/ACT inhaler  Commonly known as:  XOPENEX HFA  Inhale 2 puffs into the lungs every 6 (six) hours as needed for wheezing or shortness of breath.     magic mouthwash Soln  Swish with 2 teaspoonfuls by mouth as needed     meclizine 25 MG tablet  Commonly known as:  ANTIVERT  take 1 tablet by mouth three times a day if needed for dizziness     methocarbamol 500 MG tablet  Commonly known as:  ROBAXIN  Take 1 tablet 3 times per day as needed for 10 days     metoprolol succinate 25 MG 24 hr tablet  Commonly known as:  TOPROL-XL  take 1 tablet by mouth once daily     omeprazole 40 MG capsule  Commonly known as:  PRILOSEC  Take 1 capsule (40 mg total) by mouth daily.     omeprazole 20 MG capsule  Commonly known as:  PRILOSEC     OXYGEN  3-3.5 liters O2 pulsed     pravastatin  20 MG tablet  Commonly known as:  PRAVACHOL  take 1 tablet by mouth once daily     predniSONE 5 MG tablet  Commonly known as:  DELTASONE  1  tablet by mouth daily.     promethazine 25 MG tablet  Commonly known as:  PHENERGAN  Take 1 tablet (25 mg total) by mouth every 8 (eight) hours as needed for nausea or vomiting.     Tiotropium Bromide-Olodaterol 2.5-2.5 MCG/ACT Aers  Commonly known as:  STIOLTO RESPIMAT  Inhale 2 puffs into the lungs daily.     trimethoprim 100 MG tablet  Commonly known as:  TRIMPEX  Reported on 09/06/2015     valsartan 160 MG tablet  Commonly known as:  DIOVAN  Take 1 tablet (160 mg total) by mouth daily.     VIGAMOX 0.5 % ophthalmic solution  Generic drug:  moxifloxacin  Reported on 09/06/2015        Allergies:  Allergies  Allergen Reactions  . Chlordiazepoxide-Clidinium Other (See Comments)    Sleepy,weak  . Biaxin [Clarithromycin]     Foul taste, abd pain, diarrhea  . Tramadol   . Codeine     REACTION: gi upset- only in high doses per pt.  Can tolerate cough syrup with codeine.     Past Medical History  Diagnosis Date  . Irritable bowel syndrome   . Other chronic nonalcoholic liver disease   . Esophageal reflux   . Allergic rhinitis, cause unspecified     Sinus CT Rec 12-23-2009  . 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 Sciatica   . Shingles 2010  . Steatohepatitis   . Diabetes mellitus   . Diverticulosis   . Hiatal hernia   . Esophageal stricture   . Diarrhea   . GERD (gastroesophageal reflux disease)   . Shortness of breath   . Hypertension   . Arthritis   . Glaucoma   . Complication of anesthesia     takes along time to wake up  . Oxygen deficiency   . Benign paroxysmal positional vertigo 08/09/2012  . Neuropathy in diabetes ([place]) 08/09/2012    Past Surgical History  Procedure Laterality Date  . Cholecystectomy open  1978  . Liver biopsy  08-1992  . Parathyroid exploration    . Tonsillectomy and adenoidectomy    . Total abdominal hysterectomy    . Esophagogastroduodenoscopy  9485,46-27    H  Hernia,es.stricture s/p dil 78F  . Colonoscopy  07-2001    mild diverticulosis  . Carpal tunnel release      right hand  . Cataract extraction      Bilateral  . Ulnar nerve transposition  12/07/2011    Procedure: ULNAR NERVE DECOMPRESSION/TRANSPOSITION;this was cancelled-not done  Surgeon: Cammie Sickle., MD;  Location: [facility];  Service: Orthopedics;  Laterality: Right;  right ulnar nerve in situ decompression  . Ulnar tunnel release  03/07/2012    Procedure: CUBITAL TUNNEL RELEASE;  Surgeon: Roseanne Kaufman, MD;  Location: [place];  Service: Orthopedics;  Laterality: Right;  ulnar nerve release at the elbow    . Appendectomy    . Eye surgery      Family History  Problem Relation Age of Onset  . Lung cancer Mother     small cell;Byssinosis  . Heart disease Father   . Lung cancer Sister   . Liver cancer Sister     ? mets from  another area of the body  . Diabetes      grandmother  . Stroke Maternal Grandfather     Social History:  reports that she quit smoking about 27 years ago. Her smoking use included Cigarettes. She has never used smokeless tobacco. She reports that she does not drink alcohol or use illicit drugs.  Review of Systems:  Following is a copy of previous note:  LOW back pain, left hip pain: She is still having symptoms, not clear if she is benefiting from Celebrex but tries to take this only once a day for fear of GI side effects She had various other musculoskeletal pains and is trying some nutritional supplements from sports medicine specialist  HYPERTENSION:   blood pressure is well controlled and consistent  She is compliant with  160 mg Diovan  She takes Xanax at night mostly and lorazepam in the daytime for anxiety  HYPERLIPIDEMIA:  The lipid abnormality consists of elevated LDL treated with pravastatin 20 mg only which she has no side effects from    Her last LDL is  below 100 Usually her triglycerides are  increased but better now   Lab Results  Component Value Date   CHOL 147 08/05/2015   HDL 41.90 08/05/2015   LDLCALC 77 08/05/2015   LDLDIRECT 89.0 10/14/2014   TRIG 139.0 08/05/2015   CHOLHDL 4 08/05/2015    She is followed by pulmonologist for chronic COPD, taking home oxygen and symptoms are cough, dyspnea and recurrent bronchitis Recently given prednisone for cough  Dizziness- has occasional dizzy spells of unclear etiology, Tends to feel off balance also   Diarrhea: She has had long-standing intermittent diarrhea possibly IBS. Is taking Questran as needed, recently not needing it CONSTIPATION: More recently is getting constipated without taking any Questran.  She does take Bentyl for abdominal cramps still She had some straining this weekend for her bowel movement and saw some blood.  NEUROPATHY: She has numbness in her feet.  Also has tingling and numbness in her hands Foot exam done in 8/15: Foot exam shows sensory loss distally, normal pulses and no skin lesions   Examination:   BP 154/84 mmHg  Pulse 101  Ht 5' 3"  (1.6 m)  Wt 197 lb (89.359 kg)  BMI 34.91 kg/m2  SpO2 95%  Body mass index is 34.91 kg/(m^2).   Standing blood pressure 155/75 No ankle edema Romberg sign is positive   ASSESSMENT/ PLAN:   WEAKNESS: Etiology is unclear, has had this occasionally and no causes for this found previously. She has not had any evaluation for B12 levels previously and will check this along with CBC and thyroid Is possible that she may be getting a mild addisonian type picture with weakness although her blood pressure has not changed However she appears to be more anxious and with episodes of anxiety and shakiness this may be masked  For now will give her a short term trial of 5 mg prednisone for 4 days if it helps She can also take Xanax as needed for anxiety Discussed that she can try taking an extra Toprol if she has extra episodes of shakiness  Diabetes type 2  See  history of present illness for detailed discussion of his current management, blood sugar patterns and problems identified  The patient's diabetes control is slightly better with getting off prednisone but blood sugars are still variable as before She will continue the current regimen until next visit  Pain in back and joints: she can continue  Celebrex as she has some improvement in her finger joints and mother pains  HYPERTENSION:  Fairly good control without orthostasis.  Blood pressure is slightly higher today but may be related to anxiety   Counseling time over 50% of today's 25 minute visit on above topics   Patient Instructions  May take extra Toprol with excess shakiness       Karsten Howry 10/18/2015, 3:04 PM

## 2015-10-18 NOTE — Patient Instructions (Signed)
May take extra Toprol with excess shakiness

## 2015-10-19 NOTE — Progress Notes (Signed)
Quick Note:  Please let patient know that the lab result is normal and no further action needed ______ 

## 2015-10-23 ENCOUNTER — Other Ambulatory Visit: Payer: Self-pay | Admitting: Endocrinology

## 2015-10-27 ENCOUNTER — Ambulatory Visit (INDEPENDENT_AMBULATORY_CARE_PROVIDER_SITE_OTHER): Payer: Medicare Other | Admitting: Family Medicine

## 2015-10-27 ENCOUNTER — Encounter: Payer: Self-pay | Admitting: Family Medicine

## 2015-10-27 VITALS — BP 142/70 | HR 95 | Ht 63.0 in | Wt 195.0 lb

## 2015-10-27 DIAGNOSIS — M2550 Pain in unspecified joint: Secondary | ICD-10-CM

## 2015-10-27 DIAGNOSIS — M48061 Spinal stenosis, lumbar region without neurogenic claudication: Secondary | ICD-10-CM

## 2015-10-27 DIAGNOSIS — M4806 Spinal stenosis, lumbar region: Secondary | ICD-10-CM | POA: Diagnosis not present

## 2015-10-27 NOTE — Patient Instructions (Signed)
Good to see you  I am sorry you are still in pain  We will try the epidural and see if that helps with some of the pain. They will be calling you  If the shot does not help then start the aquatic therapy  Keep trying the vitamins  After the aquatic therapy see me again in 6-8 weeks.

## 2015-10-27 NOTE — Assessment & Plan Note (Signed)
Patient still Main complaint is the arthralgias of multiple joints. I do believe that this is all secondary to the osteoarthritis and likely some osteopenia. 90 believe that some polyneuropathy. Patient has tried many medications and also has side effect. Concern to start any other medication secondary to this other than narcotics which I do not think will be beneficial for her. Patient encouraged to potentially try aquatic formal physical therapy for balance and coordination as well as strengthening. Hopefully this will be beneficial. With patient having more of a chronic back pain and has a history of osteoarthritic changes as well as degenerative scoliosis possible epidural could be beneficial. This was ordered as well.

## 2015-10-27 NOTE — Progress Notes (Signed)
[facility] [place],  [address] Phone: [phone] Subjective:     CC: arthralgia Follow up  [id number] [name] is a 80 y.o. female coming in with complaint of arthralgia.multiple different joints. Patient has had significant pain overall. Patient was to try over-the-counter medications as well as some home exercises. Patient has not done either of these on a regular basis. States that it is very difficult to do the things as well as the over-the-counter medications to cost money and she was unable to buy on him. Patient still feels that possible narcotics could be beneficial. Has seen multiple different providers previously for these pains and has been given different suggestions. Patient states that it seems to be the worsening pain at this time is her lower back. Has been told that she could benefit from an epidural but she has been scared to do so. Patient feels that the pain is unrelenting and she will need to do something for this is affecting all daily activities. Patient is not a surgical candidate secondary to her comorbidities. Patient sates that her back pain is so severe she can only stand for 2-5 minutes without having to sit down. Unable to cut herself and make some daily activities very difficult.    Past Medical History  Diagnosis Date  . Irritable bowel syndrome   . Other chronic nonalcoholic liver disease   . Esophageal reflux   . Allergic rhinitis, cause unspecified     Sinus CT Rec 12-23-2009  . 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 Sciatica   . Shingles 2010  . Steatohepatitis   . Diabetes mellitus   . Diverticulosis   . Hiatal hernia   . Esophageal stricture   . Diarrhea   . GERD (gastroesophageal reflux disease)   . Shortness of breath   . Hypertension   . Arthritis   . Glaucoma   . Complication of anesthesia     takes along time to wake up   . Oxygen deficiency   . Benign paroxysmal positional vertigo 08/09/2012  . Neuropathy in diabetes ([place]) 08/09/2012   Past Surgical History  Procedure Laterality Date  . Cholecystectomy open  1978  . Liver biopsy  08-1992  . Parathyroid exploration    . Tonsillectomy and adenoidectomy    . Total abdominal hysterectomy    . Esophagogastroduodenoscopy  8768,11-57    H Hernia,es.stricture s/p dil 61F  . Colonoscopy  07-2001    mild diverticulosis  . Carpal tunnel release      right hand  . Cataract extraction      Bilateral  . Ulnar nerve transposition  12/07/2011    Procedure: ULNAR NERVE DECOMPRESSION/TRANSPOSITION;this was cancelled-not done  Surgeon: Cammie Sickle., MD;  Location: [facility];  Service: Orthopedics;  Laterality: Right;  right ulnar nerve in situ decompression  . Ulnar tunnel release  03/07/2012    Procedure: CUBITAL TUNNEL RELEASE;  Surgeon: Roseanne Kaufman, MD;  Location: [place];  Service: Orthopedics;  Laterality: Right;  ulnar nerve release at the elbow    . Appendectomy    . Eye surgery     Social History   Social History  . Marital Status: Single    Spouse Name: N/A  . Number of Children: N/A  . Years of Education: N/A   Occupational History  . Retired     Social History Main  Topics  . Smoking status: Former Smoker    Types: Cigarettes    Quit date: 05/08/1988  . Smokeless tobacco: Never Used  . Alcohol Use: No  . Drug Use: No  . Sexual Activity: No   Other Topics Concern  . None   Social History Narrative   Allergies  Allergen Reactions  . Chlordiazepoxide-Clidinium Other (See Comments)    Sleepy,weak  . Biaxin [Clarithromycin]     Foul taste, abd pain, diarrhea  . Tramadol   . Codeine     REACTION: gi upset- only in high doses per pt.  Can tolerate cough syrup with codeine.    Family History  Problem Relation Age of Onset  . Lung cancer Mother     small cell;Byssinosis  . Heart disease Father     . Lung cancer Sister   . Liver cancer Sister     ? mets from another area of the body  . Diabetes      grandmother  . Stroke Maternal Grandfather     Past medical history, social, surgical and family history all reviewed in electronic medical record.  No pertanent information unless stated regarding to the chief complaint.   Review of Systems: No headache, visual changes, nausea, vomiting, diarrhea, constipation, dizziness, abdominal pain, skin rash, fevers, chills, night sweats, weight loss, swollen lymph nodes,, , mood changes.   Objective Blood pressure 142/70, pulse 95, height 5' 3"  (1.6 m), weight 195 lb (88.451 kg), SpO2 94 %.  General: No apparent distress alert and oriented x3 mood and affect normal, dressed appropriately. Patient is on oxygen HEENT: Pupils equal, extraocular movements intact  Respiratory: Patient's speak in full sentences mildly short of breath Cardiovascular: +1 lower extremity edema, non tender, no erythema  Skin: Warm dry intact with no signs of infection or rash on extremities or on axial skeleton. Dry skin noted Abdomen: Soft nontender  Neuro: Cranial nerves II through XII are intact, neurovascularly intact in all extremities with 2+ DTRs and 2+ pulses.  Lymph: No lymphadenopathy of posterior or anterior cervical chain or axillae bilaterally.  Gait normal with good balance and coordination.  MSK:  Non tender with full range of motion and good stability and symmetric strength and tone of shoulders, elbows, wrist, hip, knee and ankles bilaterally. Significant arthritic changes of multiple joints. Patient has diffuse tenderness of multiple joints Even to light palpation. Patient does have arthritic changes of the hands. Range of motion of patient shoulders to have arthritic crepitus noted but does have full range of motion.  Patient's back is tender to palpation and appears palmar musculature as well as somewhat over the spinous process but seems to be out of  proportion from the amount of pressure. Patient has significant tightness of the hamstrings bilaterally and unable to do straight leg test. T10 reflexes do seem to be intact. Dorsalis pedis pulses are felt. Unable to do [facility] test secondary to discomfort and tightness.   Impression and Recommendations:     This case required medical decision making of moderate complexity.      Note: This dictation was prepared with Dragon dictation along with smaller phrase technology. Any transcriptional errors that result from this process are unintentional.

## 2015-10-27 NOTE — Progress Notes (Signed)
Pre visit review using our clinic review tool, if applicable. No additional management support is needed unless otherwise documented below in the visit note. 

## 2015-10-27 NOTE — Assessment & Plan Note (Signed)
Severe degenerative changes of the lumbar noted previously and x-rays and advance imaging. Patient's multiple levels but I do feel that her L5-S1 likely has the worse symptoms at this time. I do feel an epidural could be beneficial. Patient is not on a blood thinner. Encourage patient to monitor her blood sugars closely for the next 3 days after it. Patient and will follow-up with me again in 2-4 weeks afterwards to see how she is responding. Patient that this is the only treatment options that we have available for her at this time that I feel would be safe  Spent  25 minutes with patient face-to-face and had greater than 50% of counseling including as described above in assessment and plan.

## 2015-10-29 ENCOUNTER — Telehealth: Payer: Self-pay | Admitting: Endocrinology

## 2015-10-29 NOTE — Telephone Encounter (Signed)
Pt is waiting on [name] to ok the epidural inj for her back please advise

## 2015-10-29 NOTE — Telephone Encounter (Signed)
See below

## 2015-10-29 NOTE — Telephone Encounter (Signed)
Okay to proceed.  We will need to increase insulin doses for high sugars as follows: TRESIBA insulin: Will need to increase by 20 units when blood sugars are over 250 in the morning NOVOLOG insulin will need to be increase by 10-15 units when blood sugars are over 250

## 2015-10-29 NOTE — Telephone Encounter (Signed)
I contacted the pt and advised of note below and voiced understanding.

## 2015-11-02 ENCOUNTER — Telehealth: Payer: Self-pay | Admitting: Endocrinology

## 2015-11-02 MED ORDER — CIPROFLOXACIN HCL 500 MG PO TABS: 500.0000 mg | ORAL_TABLET | Freq: Two times a day (BID) | ORAL | Status: DC

## 2015-11-02 NOTE — Telephone Encounter (Signed)
Discussed with patient.  Her systolic blood pressure is about 126-130 setting. She will try to take readings standing also in the meantime she can cut Diovan and half She has started with some burning and frequent urination and will need to send her Cipro 500  twice a day for 5 days, please send

## 2015-11-02 NOTE — Telephone Encounter (Signed)
Rx submitted

## 2015-11-02 NOTE — Telephone Encounter (Signed)
PT called, she said her Cystitis has been very bad this past weekend/week, she needs medication to help with that.   Also, her diastolic BP reading has been extremely low and last night was so low she could hardly get up.  She has been feeling very weak and would like to speak with you about this.  Requests CB as soon as possible.

## 2015-11-08 DIAGNOSIS — H35372 Puckering of macula, left eye: Secondary | ICD-10-CM | POA: Diagnosis not present

## 2015-11-08 DIAGNOSIS — Z961 Presence of intraocular lens: Secondary | ICD-10-CM | POA: Diagnosis not present

## 2015-11-08 DIAGNOSIS — H26491 Other secondary cataract, right eye: Secondary | ICD-10-CM | POA: Diagnosis not present

## 2015-11-08 DIAGNOSIS — H04123 Dry eye syndrome of bilateral lacrimal glands: Secondary | ICD-10-CM | POA: Diagnosis not present

## 2015-11-08 DIAGNOSIS — E119 Type 2 diabetes mellitus without complications: Secondary | ICD-10-CM | POA: Diagnosis not present

## 2015-11-10 ENCOUNTER — Other Ambulatory Visit: Payer: Medicare Other

## 2015-11-15 ENCOUNTER — Ambulatory Visit (INDEPENDENT_AMBULATORY_CARE_PROVIDER_SITE_OTHER): Payer: Medicare Other | Admitting: Endocrinology

## 2015-11-15 ENCOUNTER — Encounter: Payer: Self-pay | Admitting: Endocrinology

## 2015-11-15 ENCOUNTER — Ambulatory Visit: Payer: Medicare Other | Admitting: Endocrinology

## 2015-11-15 VITALS — BP 130/72 | HR 91 | Ht 63.0 in | Wt 198.0 lb

## 2015-11-15 DIAGNOSIS — R531 Weakness: Secondary | ICD-10-CM | POA: Diagnosis not present

## 2015-11-15 DIAGNOSIS — Z794 Long term (current) use of insulin: Secondary | ICD-10-CM

## 2015-11-15 DIAGNOSIS — E1165 Type 2 diabetes mellitus with hyperglycemia: Secondary | ICD-10-CM

## 2015-11-15 DIAGNOSIS — E785 Hyperlipidemia, unspecified: Secondary | ICD-10-CM

## 2015-11-15 DIAGNOSIS — M3500 Sicca syndrome, unspecified: Secondary | ICD-10-CM | POA: Diagnosis not present

## 2015-11-15 DIAGNOSIS — E118 Type 2 diabetes mellitus with unspecified complications: Secondary | ICD-10-CM

## 2015-11-15 LAB — BASIC METABOLIC PANEL
BUN: 15 mg/dL (ref 6–23)
CHLORIDE: 97 meq/L (ref 96–112)
CO2: 32 meq/L (ref 19–32)
CREATININE: 0.7 mg/dL (ref 0.40–1.20)
Calcium: 9.7 mg/dL (ref 8.4–10.5)
GFR: 85.93 mL/min (ref >60.00)
GLUCOSE: 196 mg/dL — AB (ref 70–99)
Potassium: 4.5 mEq/L (ref 3.5–5.1)
SODIUM: 136 meq/L (ref 135–145)

## 2015-11-15 LAB — LIPID PANEL
CHOLESTEROL: 154 mg/dL (ref 0–200)
HDL: 39.6 mg/dL (ref >39.00)
LDL Cholesterol: 84 mg/dL (ref 0–99)
NonHDL: 114.22
TRIGLYCERIDES: 151 mg/dL — AB (ref 0.0–149.0)
Total CHOL/HDL Ratio: 4
VLDL: 30.2 mg/dL (ref 0.0–40.0)

## 2015-11-15 LAB — HEMOGLOBIN A1C: HEMOGLOBIN A1C: 10.5 % — AB (ref 4.6–6.5)

## 2015-11-15 MED ORDER — FLUCONAZOLE 150 MG PO TABS: 150.0000 mg | ORAL_TABLET | Freq: Once | ORAL | Status: DC

## 2015-11-15 NOTE — Progress Notes (Signed)
Patient ID: [name], female   DOB: 03-04-1936, 80 y.o.   MRN: [id number]   Reason for Appointment: Diabetes and follow-up of multiple problems  History of Present Illness    PROBLEM 1: Weakness and associated symptoms  She had called about weakness again and with reducing her Diovan to half tablet she thinks she is feeling better.  She was concerned about her diastolic pressure being low  She is again asking about the times she feels nervous and gets shaken up very quickly with any excessive noise or emotional change.  She did not try Xanax as directed   DIABETES type II since 1980   She has been on insulin using Lantus and Humalog for several years and usually A1c around 8% despite fairly good compliance with diet, exercise and glucose monitoring. She also had been on Actos previously which probably benefited her especially with her fatty liver but she stopped it because of fear of side effects      Recent history:   Insulin regimen: Tresiba  66 acs with and Novolog 14/16 at breakfast, 16-18 lunch and 18-22   Her A1c has been consistently around 9- 10%  Her A1c was at the highest level in the long time at 10.2 in 3/17 She was changed from Lantus to [place] insulin and this was  decreased to 66 units, previously on 94 units of Lantus daily He has taken higher doses of Novolog but does not increase it much even when blood sugars are high  Oral hypoglycemic drugs: none       Side effects from medications: None         Proper timing of medications in relation to meals: Yes.          Monitors blood glucose:  5 times a day   Glucometer:  Accu-Chek         Blood Glucose averages recently from meter download:  Mean values apply above for all meters except median for One Touch  PRE-MEAL Fasting Lunch Dinner Bedtime Overall  Glucose range: 112-228    137-278  112-343   Mean/median: 176  227  228  231  215    Meals: 3 meals per day.  Smaller portions; lunch 1-2 pm  Supper 6 to 7 PM     Physical activity: exercise: Unable to do any because of dyspnea and fatigue            Dietician visit: Most recent: Several years ago           Complications: are: Peripheral neuropathy with sensory loss     Wt Readings from Last 3 Encounters:  11/15/15 198 lb (89.812 kg)  10/27/15 195 lb (88.451 kg)  10/18/15 197 lb (89.359 kg)    Lab Results  Component Value Date   HGBA1C 10.2 08/05/2015   HGBA1C 9.6 04/26/2015   HGBA1C 9.9* 12/11/2014   Lab Results  Component Value Date   MICROALBUR 6.9* 10/14/2014   LDLCALC 77 08/05/2015   CREATININE 0.62 10/18/2015     OTHER problems  are detailed in review of systems     Medication List       This list is accurate as of: 11/15/15  1:10 PM.  Always use your most recent med list.               acetaminophen 500 MG tablet  Commonly known as:  TYLENOL  Take 500 mg by mouth every 6 (six) hours as needed for pain.  ALPRAZolam 0.5 MG tablet  Commonly known as:  XANAX  take 1/2 to 1 tablet by mouth twice a day     amoxicillin 500 MG tablet  Commonly known as:  AMOXIL  Take 1 tablet (500 mg total) by mouth 3 (three) times daily.     aspirin 325 MG tablet  Take 325 mg by mouth daily.     B-D UF III MINI PEN NEEDLES 31G X 5 MM Misc  Generic drug:  Insulin Pen Needle  USE 5 PER DAY TO INJECT INSULIN     bacitracin ophthalmic ointment  Reported on 09/06/2015     benzonatate 200 MG capsule  Commonly known as:  TESSALON  Take 1 capsule (200 mg total) by mouth 3 (three) times daily as needed for cough.     celecoxib 100 MG capsule  Commonly known as:  CELEBREX  Take 1 capsule (100 mg total) by mouth 2 (two) times daily.     cholestyramine 4 g packet  Commonly known as:  QUESTRAN  Take one package up to 2times a day as needed for diarrhea.     dicyclomine 10 MG capsule  Commonly known as:  BENTYL  take 1 capsule by mouth once daily     fluconazole 150 MG tablet  Commonly known as:  DIFLUCAN    Take 1 tablet (150 mg total) by mouth once.     glucose blood test strip  Commonly known as:  ACCU-CHEK AVIVA PLUS  Use as instructed to check blood sugars 8 times per day dx code 250.02     insulin aspart 100 UNIT/ML FlexPen  Commonly known as:  NOVOLOG FLEXPEN  Inject 3 times daily with meals. 18 units with breakfast 22 units with lunch and 24 units with supper.     Insulin Degludec 200 UNIT/ML Sopn  Commonly known as:  TRESIBA FLEXTOUCH  Inject 94 Units into the skin daily.     Insulin Syringe-Needle U-100 31G X 15/64" 0.5 ML Misc  Commonly known as:  BD INSULIN SYRINGE ULTRAFINE  Use 2 per day dx code E11.65     ipratropium 17 MCG/ACT inhaler  Commonly known as:  ATROVENT HFA  Inhale 2 puffs into the lungs 2 (two) times daily as needed (shortness of breath).     levalbuterol 45 MCG/ACT inhaler  Commonly known as:  XOPENEX HFA  Inhale 2 puffs into the lungs every 6 (six) hours as needed for wheezing or shortness of breath.     magic mouthwash Soln  Swish with 2 teaspoonfuls by mouth as needed     meclizine 25 MG tablet  Commonly known as:  ANTIVERT  take 1 tablet by mouth three times a day if needed for dizziness     methocarbamol 500 MG tablet  Commonly known as:  ROBAXIN  Take 1 tablet 3 times per day as needed for 10 days     metoprolol succinate 25 MG 24 hr tablet  Commonly known as:  TOPROL-XL  take 1 tablet by mouth once daily     omeprazole 40 MG capsule  Commonly known as:  PRILOSEC  Take 1 capsule (40 mg total) by mouth daily.     omeprazole 20 MG capsule  Commonly known as:  PRILOSEC     OXYGEN  3-3.5 liters O2 pulsed     pravastatin 20 MG tablet  Commonly known as:  PRAVACHOL  take 1 tablet by mouth once daily     predniSONE 5 MG tablet  Commonly known  as:  DELTASONE  1 tablet by mouth daily.     promethazine 25 MG tablet  Commonly known as:  PHENERGAN  Take 1 tablet (25 mg total) by mouth every 8 (eight) hours as needed for nausea or  vomiting.     Tiotropium Bromide-Olodaterol 2.5-2.5 MCG/ACT Aers  Commonly known as:  STIOLTO RESPIMAT  Inhale 2 puffs into the lungs daily.     trimethoprim 100 MG tablet  Commonly known as:  TRIMPEX  Reported on 09/06/2015     valsartan 160 MG tablet  Commonly known as:  DIOVAN  take 1 tablet by mouth once daily     VIGAMOX 0.5 % ophthalmic solution  Generic drug:  moxifloxacin  Reported on 09/06/2015        Allergies:  Allergies  Allergen Reactions  . Chlordiazepoxide-Clidinium Other (See Comments)    Sleepy,weak  . Biaxin [Clarithromycin]     Foul taste, abd pain, diarrhea  . Tramadol   . Codeine     REACTION: gi upset- only in high doses per pt.  Can tolerate cough syrup with codeine.     Past Medical History  Diagnosis Date  . Irritable bowel syndrome   . Other chronic nonalcoholic liver disease   . Esophageal reflux   . Allergic rhinitis, cause unspecified     Sinus CT Rec 12-23-2009  . 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 Sciatica   . Shingles 2010  . Steatohepatitis   . Diabetes mellitus   . Diverticulosis   . Hiatal hernia   . Esophageal stricture   . Diarrhea   . GERD (gastroesophageal reflux disease)   . Shortness of breath   . Hypertension   . Arthritis   . Glaucoma   . Complication of anesthesia     takes along time to wake up  . Oxygen deficiency   . Benign paroxysmal positional vertigo 08/09/2012  . Neuropathy in diabetes ([place]) 08/09/2012    Past Surgical History  Procedure Laterality Date  . Cholecystectomy open  1978  . Liver biopsy  08-1992  . Parathyroid exploration    . Tonsillectomy and adenoidectomy    . Total abdominal hysterectomy    . Esophagogastroduodenoscopy  7622,63-33    H Hernia,es.stricture s/p dil 69F  . Colonoscopy  07-2001    mild diverticulosis  . Carpal tunnel release      right hand  . Cataract extraction      Bilateral  . Ulnar nerve transposition   12/07/2011    Procedure: ULNAR NERVE DECOMPRESSION/TRANSPOSITION;this was cancelled-not done  Surgeon: Cammie Sickle., MD;  Location: [facility];  Service: Orthopedics;  Laterality: Right;  right ulnar nerve in situ decompression  . Ulnar tunnel release  03/07/2012    Procedure: CUBITAL TUNNEL RELEASE;  Surgeon: Roseanne Kaufman, MD;  Location: [facility];  Service: Orthopedics;  Laterality: Right;  ulnar nerve release at the elbow    . Appendectomy    . Eye surgery      Family History  Problem Relation Age of Onset  . Lung cancer Mother     small cell;Byssinosis  . Heart disease Father   . Lung cancer Sister   . Liver cancer Sister     ? mets from another area of the body  . Diabetes      grandmother  . Stroke Maternal Grandfather     Social History:  reports that she quit  smoking about 27 years ago. Her smoking use included Cigarettes. She has never used smokeless tobacco. She reports that she does not drink alcohol or use illicit drugs.  Review of Systems:  She is asking about dry mouth and eyes.  She was told by her ophthalmologist she may have Sjogren syndrome Does not have any inflamed peripheral joints usually  LOW back pain, left hip pain: She was scheduled for epidural steroids but she canceled She had various other musculoskeletal pains and is trying some nutritional supplements from sports medicine specialist  HYPERTENSION:   blood pressure is well controlled and consistent  She is compliant with half tablet of 160 mg Diovan, recently decreased because of her blood pressure being low-normal  She takes Xanax at night mostly and lorazepam in the daytime for anxiety  HYPERLIPIDEMIA:  The lipid abnormality consists of elevated LDL treated with pravastatin 20 mg only which she has no side effects from    Her last LDL is  below 100 Usually her triglycerides are increased but not recently   Lab Results  Component Value Date   CHOL 147  08/05/2015   HDL 41.90 08/05/2015   LDLCALC 77 08/05/2015   LDLDIRECT 89.0 10/14/2014   TRIG 139.0 08/05/2015   CHOLHDL 4 08/05/2015    She is followed by pulmonologist for chronic COPD, taking home oxygen and symptoms are cough, dyspnea and recurrent bronchitis  Dizziness- has occasional dizzy spells of unclear etiology, Tends to feel off balance also   Diarrhea: She has had long-standing intermittent diarrhea possibly IBS. Is taking Questran as needed, recently not needing it.  Occasionally has constipation also   NEUROPATHY: She has numbness in her feet.  Also has tingling and numbness in her hands Foot exam done in 8/15: Foot exam shows sensory loss distally, normal pulses and no skin lesions   Examination:   BP 124/64 mmHg  Pulse 91  Ht 5' 3"  (1.6 m)  Wt 198 lb (89.812 kg)  BMI 35.08 kg/m2  SpO2 94%  Body mass index is 35.08 kg/(m^2).   Standing blood pressure    ASSESSMENT/ PLAN:    Diabetes type 2 with obesity See history of present illness for detailed discussion of his current management, blood sugar patterns and problems identified  The patient's diabetes control is again worse with her blood sugars averaging over 200 and about the same throughout the day Overall highest readings are in the evening Since her fasting readings are more recently over 200 frequently will need to increase her [place] progressively Discussed titration of the dose every 4-5 days by 2 units Also significant increase in Novolog were made today Although she is a candidate for Invokana she has had frequent UTIs  DRY mouth and eyes: Will check for Sjogren antibodies  Pain in back and joints: she can reschedule her epidural steroid for the back  Panic attacks: She can take Xanax as needed   Vaginitis related to antibiotic use: Diflucan prescription sent  HYPERTENSION:  Fairly good control without orthostasis today.  Controlled by half tablet Diovan currently  Counseling time over  50% of today's 25 minute visit on above topics   Patient Instructions  Tresiba  72 acs with and Novolog 16-18 at breakfast, 20 lunch and 24-26  Keep going up on Tresiba 2 units every 4-5 days     [name] 11/15/2015, 1:10 PM

## 2015-11-15 NOTE — Patient Instructions (Signed)
Tresiba  72 acs with and Novolog 16-18 at breakfast, 20 lunch and 24-26  Keep going up on Tresiba 2 units every 4-5 days

## 2015-11-16 ENCOUNTER — Telehealth: Payer: Self-pay | Admitting: Endocrinology

## 2015-11-16 DIAGNOSIS — N3941 Urge incontinence: Secondary | ICD-10-CM | POA: Diagnosis not present

## 2015-11-16 DIAGNOSIS — N3020 Other chronic cystitis without hematuria: Secondary | ICD-10-CM | POA: Diagnosis not present

## 2015-11-16 LAB — SJOGRENS SYNDROME-B EXTRACTABLE NUCLEAR ANTIBODY: ENA SSB (LA) Ab: <0.2 AI (ref 0.0–0.9)

## 2015-11-16 LAB — SJOGRENS SYNDROME-A EXTRACTABLE NUCLEAR ANTIBODY: ENA SSA (RO) Ab: 0.2 AI (ref 0.0–0.9)

## 2015-11-16 LAB — ANA: ANA: NEGATIVE

## 2015-11-16 NOTE — Telephone Encounter (Signed)
PT returning phone call, requests call back

## 2015-11-17 NOTE — Telephone Encounter (Signed)
Patient ask you to call concerning her insulin.

## 2015-11-17 NOTE — Telephone Encounter (Signed)
I contacted the pt. She stated she had a f/u with her Urologist and he advised her to start Myrbetric. Pt is concered this medication will cause her bp to go up. Pt wanted to know what your opinion is on taking this medication? Thanks!

## 2015-11-18 NOTE — Telephone Encounter (Signed)
This is a good medication and she should not have any effects on blood pressure

## 2015-11-18 NOTE — Telephone Encounter (Signed)
I contacted the pt and advised of note below. Pt voiced understanding.  

## 2015-11-26 ENCOUNTER — Telehealth: Payer: Self-pay | Admitting: Internal Medicine

## 2015-11-26 DIAGNOSIS — J449 Chronic obstructive pulmonary disease, unspecified: Secondary | ICD-10-CM

## 2015-11-26 MED ORDER — PREDNISONE 10 MG PO TABS: 10.0000 mg | ORAL_TABLET | Freq: Every day | ORAL | Status: DC

## 2015-11-26 MED ORDER — AMOXICILLIN 500 MG PO TABS: 500.0000 mg | ORAL_TABLET | Freq: Three times a day (TID) | ORAL | Status: DC

## 2015-11-26 NOTE — Telephone Encounter (Signed)
Called spoke with pt. She c/o prod cough with clear mucus, sinus congestion/drainage, chest tightness, SOB x 1 week. Denies any fever, nausea or vomiting. Pt request a message to be sent to [facility] for his recs. She states her pharmacy is [facility] on [address]. I informed her I would return her call once we receive CY's recs. She voiced understanding and had no further questions.  CY please advise  Allergies  Allergen Reactions  . Chlordiazepoxide-Clidinium Other (See Comments)    Sleepy,weak  . Biaxin [Clarithromycin]     Foul taste, abd pain, diarrhea  . Tramadol   . Codeine     REACTION: gi upset- only in high doses per pt.  Can tolerate cough syrup with codeine.     Current outpatient prescriptions:  .  acetaminophen (TYLENOL) 500 MG tablet, Take 500 mg by mouth every 6 (six) hours as needed for pain. , Disp: , Rfl:  .  ALPRAZolam (XANAX) 0.5 MG tablet, take 1/2 to 1 tablet by mouth twice a day, Disp: 60 tablet, Rfl: 5 .  amoxicillin (AMOXIL) 500 MG tablet, Take 1 tablet (500 mg total) by mouth 3 (three) times daily., Disp: 21 tablet, Rfl: 0 .  aspirin 325 MG tablet, Take 325 mg by mouth daily.  , Disp: , Rfl:  .  B-D UF III MINI PEN NEEDLES 31G X 5 MM MISC, USE 5 PER DAY TO INJECT INSULIN, Disp: , Rfl: 0 .  bacitracin ophthalmic ointment, Reported on 09/06/2015, Disp: , Rfl: 0 .  benzonatate (TESSALON) 200 MG capsule, Take 1 capsule (200 mg total) by mouth 3 (three) times daily as needed for cough., Disp: 30 capsule, Rfl: 5 .  celecoxib (CELEBREX) 100 MG capsule, Take 1 capsule (100 mg total) by mouth 2 (two) times daily., Disp: 60 capsule, Rfl: 2 .  cholestyramine (QUESTRAN) 4 G packet, Take one package up to 2times a day as needed for diarrhea., Disp: 60 each, Rfl: 1 .  dicyclomine (BENTYL) 10 MG capsule, take 1 capsule by mouth once daily, Disp: 30 capsule, Rfl: 5 .  fluconazole (DIFLUCAN) 150 MG tablet, Take 1 tablet (150 mg total) by mouth once., Disp: 1 tablet, Rfl: 1 .  glucose  blood (ACCU-CHEK AVIVA PLUS) test strip, Use as instructed to check blood sugars 8 times per day dx code 250.02, Disp: 250 each, Rfl: 3 .  insulin aspart (NOVOLOG FLEXPEN) 100 UNIT/ML FlexPen, Inject 3 times daily with meals. 18 units with breakfast 22 units with lunch and 24 units with supper., Disp: 30 mL, Rfl: 3 .  Insulin Degludec (TRESIBA FLEXTOUCH) 200 UNIT/ML SOPN, Inject 94 Units into the skin daily. (Patient taking differently: Inject 70 Units into the skin daily. ), Disp: 6 pen, Rfl: 3 .  Insulin Syringe-Needle U-100 (BD INSULIN SYRINGE ULTRAFINE) 31G X 15/64" 0.5 ML MISC, Use 2 per day dx code E11.65, Disp: 100 each, Rfl: 5 .  ipratropium (ATROVENT HFA) 17 MCG/ACT inhaler, Inhale 2 puffs into the lungs 2 (two) times daily as needed (shortness of breath)., Disp: 1 Inhaler, Rfl: 11 .  levalbuterol (XOPENEX HFA) 45 MCG/ACT inhaler, Inhale 2 puffs into the lungs every 6 (six) hours as needed for wheezing or shortness of breath., Disp: 1 Inhaler, Rfl: 11 .  magic mouthwash SOLN, Swish with 2 teaspoonfuls by mouth as needed, Disp: 240 mL, Rfl: 3 .  meclizine (ANTIVERT) 25 MG tablet, take 1 tablet by mouth three times a day if needed for dizziness, Disp: 90 tablet, Rfl: 3 .  methocarbamol (ROBAXIN) 500 MG tablet, Take 1 tablet 3 times per day as needed for 10 days, Disp: 30 tablet, Rfl: 0 .  metoprolol succinate (TOPROL-XL) 25 MG 24 hr tablet, take 1 tablet by mouth once daily, Disp: 30 tablet, Rfl: 5 .  omeprazole (PRILOSEC) 20 MG capsule, , Disp: , Rfl: 0 .  omeprazole (PRILOSEC) 40 MG capsule, Take 1 capsule (40 mg total) by mouth daily., Disp: 30 capsule, Rfl: 2 .  OXYGEN, 3-3.5 liters O2 pulsed, Disp: , Rfl:  .  pravastatin (PRAVACHOL) 20 MG tablet, take 1 tablet by mouth once daily, Disp: 30 tablet, Rfl: 5 .  predniSONE (DELTASONE) 5 MG tablet, 1 tablet by mouth daily., Disp: 10 tablet, Rfl: 0 .  promethazine (PHENERGAN) 25 MG tablet, Take 1 tablet (25 mg total) by mouth every 8 (eight)  hours as needed for nausea or vomiting., Disp: 20 tablet, Rfl: 0 .  Tiotropium Bromide-Olodaterol (STIOLTO RESPIMAT) 2.5-2.5 MCG/ACT AERS, Inhale 2 puffs into the lungs daily., Disp: 1 Inhaler, Rfl: 11 .  trimethoprim (TRIMPEX) 100 MG tablet, Reported on 09/06/2015, Disp: , Rfl: 1 .  valsartan (DIOVAN) 160 MG tablet, take 1 tablet by mouth once daily, Disp: 30 tablet, Rfl: 5 .  VIGAMOX 0.5 % ophthalmic solution, Reported on 09/06/2015, Disp: , Rfl:

## 2015-11-26 NOTE — Telephone Encounter (Signed)
Unless she think she's caught a cold, it may be the hot weather and poor air quality we are experiencing now. Unless she knows specifically something else she needs, would offer refill of her codeine cough syrup.

## 2015-11-26 NOTE — Telephone Encounter (Signed)
Pt stated that she had a coughing spell today at lunch and she coughed up a large amount of green sputum and feels that this is the early start on bronchitis.  She stated that she would like to have abx  Called to her pharmacy.  She stated that she doesn't know if you want her to do the prednisone too, if so, please send this as well.  Pt still have cough meds.  CY please advise. thanks

## 2015-11-26 NOTE — Telephone Encounter (Signed)
Per Dr. Annamaria Boots we can call in amox 500 mg #21 1 po TID pred 10 mg QD x 5 days #5  Called spoke with pt and is aware of recs. RX's sent in. Nothing further needed

## 2015-12-08 ENCOUNTER — Telehealth: Payer: Self-pay | Admitting: Endocrinology

## 2015-12-08 NOTE — Telephone Encounter (Signed)
Because of her joint pains, dry mouth and dry eyes

## 2015-12-08 NOTE — Telephone Encounter (Signed)
Could you advise if the results were normal or abnormal? Thanks!

## 2015-12-08 NOTE — Telephone Encounter (Addendum)
I contacted the pt and advised of Md's notes below. Pt voiced understanding and had no other questions at this time.

## 2015-12-08 NOTE — Telephone Encounter (Signed)
Normal

## 2015-12-08 NOTE — Telephone Encounter (Signed)
PT requests call back from you a soon as you can.  She stated she has been "calling for weeks"

## 2015-12-08 NOTE — Telephone Encounter (Signed)
I contacted the. Pt is requesting you to review the ANA and Sjogrens syndrome A and B blood tests from 11/15/2015. Pt also would like to know why these blood test were order and what we were looking for. Please advise, Thanks!

## 2015-12-17 ENCOUNTER — Other Ambulatory Visit: Payer: Self-pay | Admitting: Endocrinology

## 2015-12-17 NOTE — Telephone Encounter (Signed)
Pt has to have this delivered and it needs to be called in before 1200

## 2015-12-17 NOTE — Telephone Encounter (Signed)
Patient need refill send prescription to cholestyramine [facility]) 4 G packet  [place], [place] [address] [phone] (Phone) [phone] (Fax)   Send it today please, they will be deliver to her home before the weekend. Please give her a call.

## 2015-12-28 ENCOUNTER — Encounter: Payer: Self-pay | Admitting: Endocrinology

## 2015-12-28 DIAGNOSIS — E119 Type 2 diabetes mellitus without complications: Secondary | ICD-10-CM | POA: Diagnosis not present

## 2015-12-28 DIAGNOSIS — H35372 Puckering of macula, left eye: Secondary | ICD-10-CM | POA: Diagnosis not present

## 2015-12-28 DIAGNOSIS — H26491 Other secondary cataract, right eye: Secondary | ICD-10-CM | POA: Diagnosis not present

## 2015-12-28 DIAGNOSIS — H04123 Dry eye syndrome of bilateral lacrimal glands: Secondary | ICD-10-CM | POA: Diagnosis not present

## 2015-12-28 DIAGNOSIS — Z961 Presence of intraocular lens: Secondary | ICD-10-CM | POA: Diagnosis not present

## 2015-12-28 LAB — HM DIABETES EYE EXAM

## 2016-01-13 ENCOUNTER — Ambulatory Visit (INDEPENDENT_AMBULATORY_CARE_PROVIDER_SITE_OTHER): Payer: Medicare Other | Admitting: Internal Medicine

## 2016-01-13 ENCOUNTER — Ambulatory Visit (INDEPENDENT_AMBULATORY_CARE_PROVIDER_SITE_OTHER)
Admission: RE | Admit: 2016-01-13 | Discharge: 2016-01-13 | Disposition: A | Discharge status: Home / Self Care | Payer: Medicare Other | Source: Ambulatory Visit | Attending: Internal Medicine | Admitting: Internal Medicine

## 2016-01-13 ENCOUNTER — Encounter: Payer: Self-pay | Admitting: Internal Medicine

## 2016-01-13 VITALS — BP 118/70 | HR 88 | Ht 63.0 in | Wt 197.6 lb

## 2016-01-13 DIAGNOSIS — J449 Chronic obstructive pulmonary disease, unspecified: Secondary | ICD-10-CM

## 2016-01-13 DIAGNOSIS — R079 Chest pain, unspecified: Secondary | ICD-10-CM

## 2016-01-13 DIAGNOSIS — J9611 Chronic respiratory failure with hypoxia: Secondary | ICD-10-CM

## 2016-01-13 DIAGNOSIS — Z23 Encounter for immunization: Secondary | ICD-10-CM

## 2016-01-13 DIAGNOSIS — J9621 Acute and chronic respiratory failure with hypoxia: Secondary | ICD-10-CM | POA: Diagnosis not present

## 2016-01-13 NOTE — Patient Instructions (Signed)
Order- Advanced please re-evaluate eligibility for light portable O2 concentrator 3L/min   Dx chronic respiratory failure with hypoxia  Order- CXR   Atypical left chest pain

## 2016-01-13 NOTE — Progress Notes (Signed)
Patient ID: [name], female    DOB: February 10, 1936, 80 y.o.   MRN: [id number]  HPI SATURATION QUALIFICATIONS: Patient Saturations on Room Air while Ambulating = 84% Patient Saturations on 2.5 Liters of oxygen while Ambulating = 92%[name],CMA  09/28/10-  75 yoF former smoker with COPD, complicated by chronic rhinitis, GERD. Last here 04/29/10- note reviewed. Acute visit.-- Woke with soreness across under her breasts. Losing voice. Coughing spells, with much liquid coming up and from nose. Prior to that hadn't been coughing. C/o weak, dizzy, light headed- relieved by her oxygen. . Moving into a smaller apartment. Denies fever. Easy DOE short walks room to room.   10/31/10- 63 yoF former smoker with COPD, complicated by chronic rhinitis, GERD. She denies major change since last here. Has to stay in to avoid heat and on her oxygen much of the time in the house. Coughs scant clear or lemon yellow, with some hoarseness which comes and goes. She describes hoarseness as worse with phone conversations, variable but rarely if ever clear. She had seen Dr [name] years ago for epistaxis. Easy choke with eating. Discussed aspiration precautions, chin tuck.   02/26/11- 5 yoF former smoker with COPD, complicated by chronic rhinitis, GERD. Has had flu vaccine. No coughing through the summer. We first cool days as she stepped outside she would begin to cough again. Dry hacking cough. Very aware of active reflux anytime she bends over, regurgitating recently swallowed food. Has also had some diarrhea. Complains of spasms intermittently left lower anterior chest and left upper quadrant. It may all be GI. She thinks it is part of her diabetic neuropathy. Has appointment soon with Dr.Brodie.  08/16/11-75 yoF former smoker with COPD, complicated by chronic rhinitis, GERD. During the winter had prolonged diarrhea evaluated by Dr [name] and apparently attributed to her diabetic neuropathy. May have had  esophagitis-was told to take Magic mouthwash. Gradually easier dyspnea on exertion. Now on oxygen 2-3 L most of the time. She came today with acute complaint of aching pain intermittently for the past several weeks, dull. Associated with mid back at strap level. Pain is gone right now. She saw no effect of activity or position and said pain was nonpleuritic or radiating.  CXR- 08/16/11- reviewed w/ her. NAD with no acute process.   08/28/11-75 yoF former smoker with COPD, complicated by chronic rhinitis, GERD,  DM. Back pain comes and goes. She implies that it is better today and says that she really has been present a long time as part of her degenerative back pain. Complains of feeling sluggish and tired. Recent bronchitis with productive green sputum watery nose no fever. Using Atrovent. Intolerant of albuterol and Symbicort because of overstimulation. Home oxygen is 2-1/2-3 L. CXR 08/15/11-  IMPRESSION:  Hyperinflation.  No active cardiopulmonary disease.  Original Report Authenticated By: [name], M.D.   12/28/11- 41 yoF former smoker with COPD, complicated by chronic rhinitis, GERD,  DM. Not doing well-stays on O2 all the time; since being on Spiriva at first did great but has noticed having to stop more and rest during the day(nap) She  stays indoors most of the time without many friends to do things with. She often has self-limited sharp twinges right anterior chest wall and left infrascapular back. With her known degenerative disc disease, we discussed the possibility this was nerve root irritation. Spiriva helps variable cough.  04/23/12- 81 yoF former smoker with COPD, complicated by chronic rhinitis, GERD,  DM. FOLLOWS FOR: still  has good and bad days with SOB and activity levels Watery rhinorrhea. No acute problems. No respiratory problems associated with surgery for nerve impingement in her right hand. She continues oxygen 3 or 3 a half liters/Advanced PFT: 01/16/2012 severe  obstructive airways disease with insignificant response to bronchodilator, air trapping, diffusion moderately reduced. FEV1 0.82/45%, FEV1/FEC 0.46. Emphysema pattern on the loop. TLC 100%, RV 148%, DLCO 47%.  10/22/12- 85 yoF former smoker with COPD, complicated by chronic rhinitis, GERD,  DM FOLLOWS FOR:has to pace herself more; continues to have nervous spell(could be from DM) SOB pretty much all the time Episodic shaky spells for which she says she's had evaluation by neurology, ENT, primary physician and ER. "Electric shock sensation" from her known neuropathy. Stress makes all of this worse. Breathing has not really changed. We talked about the possibility she was having a vasomotor/autonomic neuropathy related to her diabetes  01/21/13- 77 yoF former smoker with COPD, complicated by chronic rhinitis, GERD,  DM FOLLOWS FOR:has to pace herself more; continues to have nervous spell(could be from DM) SOB pretty much all the time Has been treated for a corneal abscess with Cipro, another antibiotic for cough, and for chronic cystitis. Got blistering intertrigo which Dr. Dwyane Dee treated with Flagyl by her report. Little change in her chronic cough with clear mucus. Twinges of bilateral tussive rib pains.  07/21/13-78 yoF former smoker with COPD, complicated by chronic rhinitis, GERD,  DM FOLLOWS FOR: Stays tired all the time; was in [facility] January 2015. Has RN that comes to her home. Pt states she has chest tightness as well. Stays tired. Home visiting nurse has completed. Chest gets tight-nebulizer helps. Watery nose.  09/02/2013 Acute OV  Complains of 2 weeks of cough and congestion . cough still going on-lt. green,occass. wheezing,chest tightness no fcs Was called in augmentin on 4/17, finished last dose yesterday. Mucus is some better but still coughing and wheezing . Cough is keeping her up at night.  Patient denies any this is chest pain, orthopnea, PND, or leg swelling CXR last ov showed  chronic changes with COPD 07/21/13   11/24/13- 78 yoF former smoker with COPD, complicated by chronic rhinitis, GERD,  DM FOLLOWS FOR:  Increased sob, tightness in chest and coughing spells O2 3.5L/ Advanced Needed prednisone/ Augmentin in April Has home health CNA helping now.  Dyspnea w/ exertion, persistent w/o much change. Occ cough, wheeze, watery nose. Occ choke/ light aspiration. Not needing nebulizer currently. CXR 07/21/13 IMPRESSION:  1. There is mild hyperinflation consistent with COPD. There is no  evidence of pneumonia.  2. There is no evidence of CHF. There is no pleural effusion.  Electronically Signed  By: David Martinique  On: 07/21/2013 10:50  03/27/14-  8 yoF former smoker with COPD, complicated by chronic rhinitis, GERD,  DM O2 2-3.5L/ Advanced FOLLOWS FOR: O2 levels have been dropping in 80's, according to a caregiver who has her own oximeter- accuracy unknown. . SOB  as well. Occ wheeze and cough x several months- sometimes some green. Maintenance zithromax bronchitis suppression seems to work. CXR 02/19/14 IMPRESSION: Hyperinflation without acute finding. Electronically Signed  By: Lorin Picket M.D.  On: 02/19/2014 12:22  07/08/14- 56 yoF former smoker with COPD, complicated by chronic rhinitis, GERD,  DM FOLLOWS FOR: Pt reports breathing better on Stiolto. She had been off due to ins coverage, she is now back on since it is covered. She has not been back on Stiolto 1 week yet.  01/08/15- 20 yoF former  smoker with COPD, complicated by chronic rhinitis, GERD,  DM FOLLOWS FOR: Continues to wear O2 2-3 L/ Advanced Blames Stiolto inhaler for increased blood sugar although Stiolto does not contain steroid. To see ENT about throat tickle cough/foreign body sensation in throat  07/08/2015-80 year old female former smoker followed for COPD, complicated by chronic rhinitis, GERD, DM O2 3-3.5 L/ Advanced  FOLLOWS FOR: Pt c/o cough, using Tessalon Perles prn. Completed  Amox 577m - cough some improved. Reports terrible PND, runny nose and congestion, having some dizziness. Using O2 @ 3-3.5Liters CXR 04/08/2015-IMPRESSION: Low lung volumes with mild bibasilar atelectasis and/or infiltrates. Chronic cough may been a little better after antibiotic last week but long-term pattern is unchanged. Perles helps some. Noticing more postnasal drip recently. Has been having trouble with back pain-went to urgent care.  01/13/2016-89year old female former smoker followed for COPD, chronic hypoxic respiratory failure, complicated by chronic rhinitis, GERD, DM O2 3-3.5 L/ Advanced  FOLLOWS FOR: Pt states her breathing has been horrible-hard time breathing and continues to use O2. [facility]  [facility] weight. Current device is 3 or 4 months old. Helios portable O2 device is difficult for her to work with. She says she has had a series of old machines which leak or breakdown frequently and have to be replaced. She understood DME to say her insurance would not cover a POC. Describes episodes of "fiery burning" nonexertional discomfort left upper anterior chest wall while sitting quietly watching TV.  ROS-see HPI Constitutional:   No-   weight loss, night sweats, fevers, chills, fatigue, lassitude. HEENT:   No-  headaches, difficulty swallowing, tooth/dental problems, sore throat,       No-  sneezing, itching, ear ache, nasal congestion, post nasal drip,  CV:  No-   chest pain, orthopnea, PND, swelling in lower extremities, anasarca,                                                     dizziness, palpitations Resp: +shortness of breath with exertion or at rest.              + productive cough,  + non-productive cough,  No- coughing up of blood.              No-   change in color of mucus.  + wheezing.   Skin: No-   rash or lesions. GI:  +heartburn, indigestion, No-abdominal pain, nausea, vomiting,  GU: . MS:  No-   joint pain  or swelling. Neuro-     nothing unusual Psych:  No- change in mood or affect. +depression or anxiety.  No memory loss.    Objective:  OBJ- Physical Exam    talkative General- Alert, Oriented, Affect-appropriate, Distress- none acute, + overweight, + talkative, O2 Skin- rash-none, lesions- none, excoriation- none Lymphadenopathy- none Head- atraumatic            Eyes- Gross vision intact, PERRLA, conjunctivae and secretions clear            Ears- Hearing, canals-normal            Nose- Clear, no-Septal dev, mucus, polyps, erosion, perforation             Throat- Mallampati IV , mucosa -no thrush , drainage- none, tonsils- atrophic, +  hoarse, stridor-none Neck- flexible , trachea midline, no stridor , thyroid nl, carotid no bruit Chest - symmetrical excursion , unlabored           Heart/CV- RRR , no murmur , no gallop  , no rub, nl s1 s2                           - JVD- none , edema- none, stasis changes- none, varices- none           Lung-  + distant but clear, unlabored, wheeze-none, cough- none while here , dullness-none, rub- none           Chest wall-  Abd-  Br/ Gen/ Rectal- Not done, not indicated Extrem- cyanosis- none, clubbing, none, atrophy- none, strength- nl Neuro- grossly intact to observation

## 2016-01-14 ENCOUNTER — Telehealth: Payer: Self-pay | Admitting: Internal Medicine

## 2016-01-14 NOTE — Telephone Encounter (Signed)
We haven't found another medicine she could tolerate that her insurance would cover. Yesterday her lungs were clear, she wasn't coughing much, and her CXR was clear. I don't have anything else to offer at this time.Suggest she talk with her primary doctor to consider other possible causes for shortness of breath that might also be part of the problem, like heart disease or anemia.

## 2016-01-14 NOTE — Telephone Encounter (Signed)
Pt aware of results of cxr.  Pt states that he Stiolto Respimat does not seem to be helping much in controlling her breathing. Pt states that she having chest tightness and horrible cough which does not seem to be improving. Requesting another medication that she might benefit from better. Please advise Dr Annamaria Boots. Thanks.     Medication List       Accurate as of 01/14/16 10:14 AM. Always use your most recent med list.          acetaminophen 500 MG tablet Commonly known as:  TYLENOL Take 500 mg by mouth every 6 (six) hours as needed for pain.   ALPRAZolam 0.5 MG tablet Commonly known as:  XANAX take 1/2 to 1 tablet by mouth twice a day   aspirin 325 MG tablet Take 325 mg by mouth daily.   B-D UF III MINI PEN NEEDLES 31G X 5 MM Misc Generic drug:  Insulin Pen Needle USE 5 PER DAY TO INJECT INSULIN   bacitracin ophthalmic ointment Reported on 09/06/2015   benzonatate 200 MG capsule Commonly known as:  TESSALON Take 1 capsule (200 mg total) by mouth 3 (three) times daily as needed for cough.   celecoxib 100 MG capsule Commonly known as:  CELEBREX Take 1 capsule (100 mg total) by mouth 2 (two) times daily.   cholestyramine 4 g packet Commonly known as:  QUESTRAN take 1 packet once daily if needed for diarrhea   dicyclomine 10 MG capsule Commonly known as:  BENTYL take 1 capsule by mouth once daily   fluconazole 150 MG tablet Commonly known as:  DIFLUCAN Take 1 tablet (150 mg total) by mouth once.   glucose blood test strip Commonly known as:  ACCU-CHEK AVIVA PLUS Use as instructed to check blood sugars 8 times per day dx code 250.02   insulin aspart 100 UNIT/ML FlexPen Commonly known as:  NOVOLOG FLEXPEN Inject 3 times daily with meals. 18 units with breakfast 22 units with lunch and 24 units with supper.   Insulin Degludec 200 UNIT/ML Sopn Commonly known as:  TRESIBA FLEXTOUCH Inject 94 Units into the skin daily.   Insulin Syringe-Needle U-100 31G X 15/64" 0.5 ML  Misc Commonly known as:  BD INSULIN SYRINGE ULTRAFINE Use 2 per day dx code E11.65   ipratropium 17 MCG/ACT inhaler Commonly known as:  ATROVENT HFA Inhale 2 puffs into the lungs 2 (two) times daily as needed (shortness of breath).   levalbuterol 45 MCG/ACT inhaler Commonly known as:  XOPENEX HFA Inhale 2 puffs into the lungs every 6 (six) hours as needed for wheezing or shortness of breath.   magic mouthwash Soln Swish with 2 teaspoonfuls by mouth as needed   meclizine 25 MG tablet Commonly known as:  ANTIVERT take 1 tablet by mouth three times a day if needed for dizziness   methocarbamol 500 MG tablet Commonly known as:  ROBAXIN Take 1 tablet 3 times per day as needed for 10 days   metoprolol succinate 25 MG 24 hr tablet Commonly known as:  TOPROL-XL take 1 tablet by mouth once daily   omeprazole 40 MG capsule Commonly known as:  PRILOSEC Take 1 capsule (40 mg total) by mouth daily.   OXYGEN 3-3.5 liters O2 pulsed   pravastatin 20 MG tablet Commonly known as:  PRAVACHOL take 1 tablet by mouth once daily   predniSONE 10 MG tablet Commonly known as:  DELTASONE Take 1 tablet (10 mg total) by mouth daily with breakfast.   Tiotropium  Bromide-Olodaterol 2.5-2.5 MCG/ACT Aers Commonly known as:  STIOLTO RESPIMAT Inhale 2 puffs into the lungs daily.   trimethoprim 100 MG tablet Commonly known as:  TRIMPEX Reported on 09/06/2015   valsartan 160 MG tablet Commonly known as:  DIOVAN take 1 tablet by mouth once daily   VIGAMOX 0.5 % ophthalmic solution Generic drug:  moxifloxacin Reported on 09/06/2015

## 2016-01-14 NOTE — Telephone Encounter (Signed)
Pt is aware of below. Nothing further needed 

## 2016-01-15 ENCOUNTER — Other Ambulatory Visit: Payer: Self-pay | Admitting: Internal Medicine

## 2016-01-16 NOTE — Assessment & Plan Note (Signed)
She describes atypical chest wall pain most consistent with nerve root irritation or neuropathy, or possibly esophagitis. It does not sound cardiogenic Plan-chest x-ray

## 2016-01-16 NOTE — Assessment & Plan Note (Signed)
She complains of shortness of breath and cough but is not coughing or wheezing at this exam. There is a component of depression aggravated by her limited mobility. She does not tolerate beta adrenergic stimulant bronchodilators and her insurance formulary is limited. Plan-continue current meds.

## 2016-01-16 NOTE — Assessment & Plan Note (Signed)
She is oxygen dependent but not strong enough to be comfortable with portable oxygen devices. Plan-we will ask DME to reevaluate her eligibility for light portable concentrator

## 2016-01-17 ENCOUNTER — Ambulatory Visit: Payer: Medicare Other | Admitting: Endocrinology

## 2016-01-18 ENCOUNTER — Telehealth: Payer: Self-pay | Admitting: Internal Medicine

## 2016-01-18 NOTE — Telephone Encounter (Signed)
Attempted to call pt. Line was busy. Will try back. 

## 2016-01-19 ENCOUNTER — Encounter: Payer: Self-pay | Admitting: Internal Medicine

## 2016-01-19 NOTE — Telephone Encounter (Signed)
Patient is returning call and INSISTING on talking to Shyrl Numbers is [phone], states she will be home all day.

## 2016-01-19 NOTE — Telephone Encounter (Signed)
Pt refusing to speak to anyone other than Katie.   [facility]

## 2016-01-19 NOTE — Telephone Encounter (Signed)
Spoke with pt. She states that her apartment is being renovated and they have requested that residents pack and move all small belongings. They are also planning to replace the flooring and repaint the walls. She states that she cannot pack and move her things or tolerate the fumes from the flooring and paint. She would like a letter written that she can not stay in the apartment after renovation until the fumes are clear and that she needs assistance in packing and moving her things. She would like this letter mailed to her home address.  CY - Please advise as to letter. Thanks!   Allergies  Allergen Reactions  . Chlordiazepoxide-Clidinium Other (See Comments)    Sleepy,weak  . Biaxin [Clarithromycin]     Foul taste, abd pain, diarrhea  . Tramadol   . Codeine     REACTION: gi upset- only in high doses per pt.  Can tolerate cough syrup with codeine.     Current Outpatient Prescriptions on File Prior to Visit  Medication Sig Dispense Refill  . acetaminophen (TYLENOL) 500 MG tablet Take 500 mg by mouth every 6 (six) hours as needed for pain.     Marland Kitchen ALPRAZolam (XANAX) 0.5 MG tablet take 1/2 to 1 tablet by mouth twice a day 60 tablet 5  . aspirin 325 MG tablet Take 325 mg by mouth daily.      . B-D UF III MINI PEN NEEDLES 31G X 5 MM MISC USE 5 PER DAY TO INJECT INSULIN  0  . bacitracin ophthalmic ointment Reported on 09/06/2015  0  . benzonatate (TESSALON) 200 MG capsule take 1 capsule by mouth three times a day if needed for cough 30 capsule 5  . celecoxib (CELEBREX) 100 MG capsule Take 1 capsule (100 mg total) by mouth 2 (two) times daily. 60 capsule 2  . cholestyramine (QUESTRAN) 4 g packet take 1 packet once daily if needed for diarrhea 60 packet 1  . dicyclomine (BENTYL) 10 MG capsule take 1 capsule by mouth once daily 30 capsule 5  . fluconazole (DIFLUCAN) 150 MG tablet Take 1 tablet (150 mg total) by mouth once. 1 tablet 1  . glucose blood (ACCU-CHEK AVIVA PLUS) test strip Use as  instructed to check blood sugars 8 times per day dx code 250.02 250 each 3  . insulin aspart (NOVOLOG FLEXPEN) 100 UNIT/ML FlexPen Inject 3 times daily with meals. 18 units with breakfast 22 units with lunch and 24 units with supper. 30 mL 3  . Insulin Degludec (TRESIBA FLEXTOUCH) 200 UNIT/ML SOPN Inject 94 Units into the skin daily. (Patient taking differently: Inject 70 Units into the skin daily. ) 6 pen 3  . Insulin Syringe-Needle U-100 (BD INSULIN SYRINGE ULTRAFINE) 31G X 15/64" 0.5 ML MISC Use 2 per day dx code E11.65 100 each 5  . ipratropium (ATROVENT HFA) 17 MCG/ACT inhaler Inhale 2 puffs into the lungs 2 (two) times daily as needed (shortness of breath). 1 Inhaler 11  . levalbuterol (XOPENEX HFA) 45 MCG/ACT inhaler Inhale 2 puffs into the lungs every 6 (six) hours as needed for wheezing or shortness of breath. 1 Inhaler 11  . magic mouthwash SOLN Swish with 2 teaspoonfuls by mouth as needed 240 mL 3  . meclizine (ANTIVERT) 25 MG tablet take 1 tablet by mouth three times a day if needed for dizziness 90 tablet 3  . methocarbamol (ROBAXIN) 500 MG tablet Take 1 tablet 3 times per day as needed for 10 days 30 tablet 0  .  metoprolol succinate (TOPROL-XL) 25 MG 24 hr tablet take 1 tablet by mouth once daily 30 tablet 5  . omeprazole (PRILOSEC) 40 MG capsule Take 1 capsule (40 mg total) by mouth daily. 30 capsule 2  . OXYGEN 3-3.5 liters O2 pulsed    . pravastatin (PRAVACHOL) 20 MG tablet take 1 tablet by mouth once daily 30 tablet 5  . predniSONE (DELTASONE) 10 MG tablet Take 1 tablet (10 mg total) by mouth daily with breakfast. 5 tablet 0  . Tiotropium Bromide-Olodaterol (STIOLTO RESPIMAT) 2.5-2.5 MCG/ACT AERS Inhale 2 puffs into the lungs daily. 1 Inhaler 11  . trimethoprim (TRIMPEX) 100 MG tablet Reported on 09/06/2015  1  . valsartan (DIOVAN) 160 MG tablet take 1 tablet by mouth once daily 30 tablet 5  . VIGAMOX 0.5 % ophthalmic solution Reported on 09/06/2015     No current  facility-administered medications on file prior to visit.

## 2016-01-19 NOTE — Telephone Encounter (Signed)
Letter has been mailed to patient.  ATC patient-line busy will have triage attempt to call patient again.

## 2016-01-19 NOTE — Telephone Encounter (Signed)
Spoke with pt and advised. Nothing further needed.  

## 2016-01-23 ENCOUNTER — Other Ambulatory Visit: Payer: Self-pay | Admitting: Internal Medicine

## 2016-01-24 ENCOUNTER — Telehealth: Payer: Self-pay | Admitting: Internal Medicine

## 2016-01-24 MED ORDER — PREDNISONE 10 MG PO TABS: ORAL_TABLET | ORAL | 0 refills | Status: DC

## 2016-01-24 NOTE — Telephone Encounter (Signed)
Offer prednisone 10 mg, #20   4 X 2 DAYS, 3 X 2 DAYS, 2 X 2 DAYS, 1 X 2 DAYS

## 2016-01-24 NOTE — Telephone Encounter (Signed)
Pt aware that Pred taper has been sent to [facility]. Nothing further needed.

## 2016-01-24 NOTE — Telephone Encounter (Signed)
Spoke with pt. She is requesting a prescription for prednisone. Reports cough, SOB, chest tightness and wheezing. Cough is non productive. Symptoms started 2 weeks ago. Would like CY's recommendations.  Allergies  Allergen Reactions  . Chlordiazepoxide-Clidinium Other (See Comments)    Sleepy,weak  . Biaxin [Clarithromycin]     Foul taste, abd pain, diarrhea  . Tramadol   . Codeine     REACTION: gi upset- only in high doses per pt.  Can tolerate cough syrup with codeine.    Current Outpatient Prescriptions on File Prior to Visit  Medication Sig Dispense Refill  . acetaminophen (TYLENOL) 500 MG tablet Take 500 mg by mouth every 6 (six) hours as needed for pain.     Marland Kitchen ALPRAZolam (XANAX) 0.5 MG tablet take 1/2 to 1 tablet by mouth twice a day 60 tablet 5  . aspirin 325 MG tablet Take 325 mg by mouth daily.      . B-D UF III MINI PEN NEEDLES 31G X 5 MM MISC USE 5 PER DAY TO INJECT INSULIN  0  . bacitracin ophthalmic ointment Reported on 09/06/2015  0  . benzonatate (TESSALON) 200 MG capsule take 1 capsule by mouth three times a day if needed for cough 30 capsule 5  . celecoxib (CELEBREX) 100 MG capsule Take 1 capsule (100 mg total) by mouth 2 (two) times daily. 60 capsule 2  . cholestyramine (QUESTRAN) 4 g packet take 1 packet once daily if needed for diarrhea 60 packet 1  . dicyclomine (BENTYL) 10 MG capsule take 1 capsule by mouth once daily 30 capsule 5  . fluconazole (DIFLUCAN) 150 MG tablet Take 1 tablet (150 mg total) by mouth once. 1 tablet 1  . glucose blood (ACCU-CHEK AVIVA PLUS) test strip Use as instructed to check blood sugars 8 times per day dx code 250.02 250 each 3  . insulin aspart (NOVOLOG FLEXPEN) 100 UNIT/ML FlexPen Inject 3 times daily with meals. 18 units with breakfast 22 units with lunch and 24 units with supper. 30 mL 3  . Insulin Degludec (TRESIBA FLEXTOUCH) 200 UNIT/ML SOPN Inject 94 Units into the skin daily. (Patient taking differently: Inject 70 Units into the  skin daily. ) 6 pen 3  . Insulin Syringe-Needle U-100 (BD INSULIN SYRINGE ULTRAFINE) 31G X 15/64" 0.5 ML MISC Use 2 per day dx code E11.65 100 each 5  . ipratropium (ATROVENT HFA) 17 MCG/ACT inhaler Inhale 2 puffs into the lungs 2 (two) times daily as needed (shortness of breath). 1 Inhaler 11  . magic mouthwash SOLN Swish with 2 teaspoonfuls by mouth as needed 240 mL 3  . meclizine (ANTIVERT) 25 MG tablet take 1 tablet by mouth three times a day if needed for dizziness 90 tablet 3  . methocarbamol (ROBAXIN) 500 MG tablet Take 1 tablet 3 times per day as needed for 10 days 30 tablet 0  . metoprolol succinate (TOPROL-XL) 25 MG 24 hr tablet take 1 tablet by mouth once daily 30 tablet 5  . omeprazole (PRILOSEC) 40 MG capsule Take 1 capsule (40 mg total) by mouth daily. 30 capsule 2  . OXYGEN 3-3.5 liters O2 pulsed    . pravastatin (PRAVACHOL) 20 MG tablet take 1 tablet by mouth once daily 30 tablet 5  . predniSONE (DELTASONE) 10 MG tablet Take 1 tablet (10 mg total) by mouth daily with breakfast. 5 tablet 0  . Tiotropium Bromide-Olodaterol (STIOLTO RESPIMAT) 2.5-2.5 MCG/ACT AERS Inhale 2 puffs into the lungs daily. 1 Inhaler 11  . trimethoprim (  TRIMPEX) 100 MG tablet Reported on 09/06/2015  1  . valsartan (DIOVAN) 160 MG tablet take 1 tablet by mouth once daily 30 tablet 5  . VIGAMOX 0.5 % ophthalmic solution Reported on 09/06/2015    . XOPENEX HFA 45 MCG/ACT inhaler inhale 2 puffs INTO THE LUNGS every 6 hours if needed for wheezing or shortness of breath 15 Inhaler 11   No current facility-administered medications on file prior to visit.     CY - please advise. Thanks.

## 2016-01-31 ENCOUNTER — Telehealth: Payer: Self-pay | Admitting: *Deleted

## 2016-01-31 NOTE — Telephone Encounter (Signed)
What insulin doses is she taking

## 2016-01-31 NOTE — Telephone Encounter (Signed)
She needs to increase her Tresiba to 80 units.  Needs to increase her usual dose of NovoLog by 4 units each time along with extra insulin. Will discuss her night sweats when she comes in

## 2016-01-31 NOTE — Telephone Encounter (Signed)
72 units of Tresiba, then her normal dose of Novolog, if it's high she takes an extra 3-5 units.

## 2016-01-31 NOTE — Telephone Encounter (Signed)
Patient called this morning, she said her sugars are still running between 200-300 and at least 2 nights a week she wakes up drenched and in cold sweats.  She has to get up and change clothes and dry off.  Please advise.

## 2016-02-02 ENCOUNTER — Telehealth: Payer: Self-pay | Admitting: Family Medicine

## 2016-02-02 NOTE — Telephone Encounter (Signed)
Spoke with pt, she decided she will wait & discuss this with Dr. Dwyane Dee next week.

## 2016-02-02 NOTE — Telephone Encounter (Signed)
Neurosurgery. Anyone.

## 2016-02-02 NOTE — Telephone Encounter (Signed)
Pt called want to know if Dr. Tamala Julian can recommend orthopedic doctor for scoliosis on her spin. Please advise.

## 2016-02-02 NOTE — Telephone Encounter (Signed)
Noted, she is aware.

## 2016-02-11 ENCOUNTER — Telehealth: Payer: Self-pay | Admitting: Endocrinology

## 2016-02-11 NOTE — Telephone Encounter (Signed)
Pt needs an urgent call back

## 2016-02-11 NOTE — Telephone Encounter (Signed)
#   was busy on callback

## 2016-02-15 ENCOUNTER — Encounter: Payer: Self-pay | Admitting: Endocrinology

## 2016-02-15 ENCOUNTER — Telehealth: Payer: Self-pay | Admitting: Endocrinology

## 2016-02-15 ENCOUNTER — Other Ambulatory Visit: Payer: Self-pay | Admitting: *Deleted

## 2016-02-15 ENCOUNTER — Ambulatory Visit (INDEPENDENT_AMBULATORY_CARE_PROVIDER_SITE_OTHER): Payer: Medicare Other | Admitting: Endocrinology

## 2016-02-15 VITALS — BP 138/82 | HR 92 | Temp 98.0°F | Resp 16 | Ht 63.0 in | Wt 196.4 lb

## 2016-02-15 DIAGNOSIS — Z794 Long term (current) use of insulin: Secondary | ICD-10-CM | POA: Diagnosis not present

## 2016-02-15 DIAGNOSIS — I10 Essential (primary) hypertension: Secondary | ICD-10-CM

## 2016-02-15 DIAGNOSIS — E119 Type 2 diabetes mellitus without complications: Secondary | ICD-10-CM

## 2016-02-15 DIAGNOSIS — M5440 Lumbago with sciatica, unspecified side: Secondary | ICD-10-CM

## 2016-02-15 DIAGNOSIS — E1149 Type 2 diabetes mellitus with other diabetic neurological complication: Secondary | ICD-10-CM

## 2016-02-15 DIAGNOSIS — E1165 Type 2 diabetes mellitus with hyperglycemia: Secondary | ICD-10-CM

## 2016-02-15 DIAGNOSIS — IMO0002 Reserved for concepts with insufficient information to code with codable children: Secondary | ICD-10-CM

## 2016-02-15 LAB — POCT GLYCOSYLATED HEMOGLOBIN (HGB A1C)
Hemoglobin A1C: 10.1
Hemoglobin A1C: 10.1

## 2016-02-15 MED ORDER — INSULIN REGULAR HUMAN (CONC) 500 UNIT/ML ~~LOC~~ SOPN: PEN_INJECTOR | SUBCUTANEOUS | 3 refills | Status: DC

## 2016-02-15 NOTE — Telephone Encounter (Signed)
Pt called back and said that she needs to talk to you before Dr. Dwyane Dee refers her to some other doctor?  She said you would know what she was talking about. Please call back as soon as possible.

## 2016-02-15 NOTE — Progress Notes (Signed)
Patient ID: [name], female   DOB: Dec 28, 1935, 80 y.o.   MRN: [id number]   Reason for Appointment: Diabetes and follow-up of multiple problems  History of Present Illness    PROBLEM 1: Weakness and associated symptoms  She had called about weakness again and with reducing her Diovan to half tablet she thinks she is feeling better.  She was concerned about her diastolic pressure being low  She is again asking about the times she feels nervous and gets shaken up very quickly with any excessive noise or emotional change.  She did not try Xanax as directed   DIABETES type II since 1980   She has been on insulin using Lantus and Humalog for several years and usually A1c around 8% despite fairly good compliance with diet, exercise and glucose monitoring. She also had been on Actos previously which probably benefited her especially with her fatty liver but she stopped it because of fear of side effects      Recent history:   Insulin regimen: Tresiba  82 acs with and Novolog 14/16 at breakfast, 16-18 lunch and 18-22   Her A1c has been consistently around 9- 10%   She was changed from Lantus to [place] insulin and this was recently again increased, previously taking as low as 66 units  He has taken higher doses of Novolog but does not appear to get adequate correction of high readings  Periodically will have relatively good readings but this is very sporadic and mostly in the afternoons  Even blood sugars overnight are ranging from about 150-300  Again highest blood sugars are at bedtime  She is still afraid of hypoglycemia and feels some symptoms with low normal sugars  Currently still eating small portions  Oral hypoglycemic drugs: none       Side effects from medications: None         Proper timing of medications in relation to meals: Yes.          Monitors blood glucose:  5 times a day   Glucometer:  Accu-Chek         Blood Glucose averages recently from meter  download:  Mean values apply above for all meters except median for One Touch  PRE-MEAL Fasting Lunch Dinner Bedtime Overall  Glucose range: 168-259  75-301  139-3 33    Mean/median: 186  223  220  258  223   Overnight AVERAGE 220  Meals: 3 meals per day.  Smaller portions; lunch 1-2 pm Supper 6 to 7 PM     Physical activity: exercise: Unable to do any because of dyspnea and fatigue            Dietician visit: Most recent: Several years ago           Complications: are: Peripheral neuropathy with sensory loss     Wt Readings from Last 3 Encounters:  02/15/16 196 lb 6.4 oz (89.1 kg)  01/13/16 197 lb 9.6 oz (89.6 kg)  11/15/15 198 lb (89.8 kg)    Lab Results  Component Value Date   HGBA1C 10.1 02/15/2016   HGBA1C 10.1 02/15/2016   HGBA1C 10.5 (H) 11/15/2015   Lab Results  Component Value Date   MICROALBUR 6.9 (H) 10/14/2014   LDLCALC 84 11/15/2015   CREATININE 0.70 11/15/2015     OTHER problems  are detailed in review of systems     Medication List       Accurate as of 02/15/16 11:59 PM. Always use  your most recent med list.          acetaminophen 500 MG tablet Commonly known as:  TYLENOL Take 500 mg by mouth every 6 (six) hours as needed for pain.   ALPRAZolam 0.5 MG tablet Commonly known as:  XANAX take 1/2 to 1 tablet by mouth twice a day   aspirin 325 MG tablet Take 325 mg by mouth daily.   B-D UF III MINI PEN NEEDLES 31G X 5 MM Misc Generic drug:  Insulin Pen Needle USE 5 PER DAY TO INJECT INSULIN   bacitracin ophthalmic ointment Reported on 09/06/2015   benzonatate 200 MG capsule Commonly known as:  TESSALON take 1 capsule by mouth three times a day if needed for cough   celecoxib 100 MG capsule Commonly known as:  CELEBREX Take 1 capsule (100 mg total) by mouth 2 (two) times daily.   cholestyramine 4 g packet Commonly known as:  QUESTRAN take 1 packet once daily if needed for diarrhea   dicyclomine 10 MG capsule Commonly known as:   BENTYL take 1 capsule by mouth once daily   fluconazole 150 MG tablet Commonly known as:  DIFLUCAN Take 1 tablet (150 mg total) by mouth once.   glucose blood test strip Commonly known as:  ACCU-CHEK AVIVA PLUS Use as instructed to check blood sugars 8 times per day dx code 250.02   insulin aspart 100 UNIT/ML FlexPen Commonly known as:  NOVOLOG FLEXPEN Inject 3 times daily with meals. 18 units with breakfast 22 units with lunch and 24 units with supper.   Insulin Degludec 200 UNIT/ML Sopn Commonly known as:  TRESIBA FLEXTOUCH Inject 94 Units into the skin daily.   insulin regular human CONCENTRATED 500 UNIT/ML kwikpen Commonly known as:  HUMULIN R U-500 KWIKPEN Inject 50 units three times daily   Insulin Syringe-Needle U-100 31G X 15/64" 0.5 ML Misc Commonly known as:  BD INSULIN SYRINGE ULTRAFINE Use 2 per day dx code E11.65   ipratropium 17 MCG/ACT inhaler Commonly known as:  ATROVENT HFA Inhale 2 puffs into the lungs 2 (two) times daily as needed (shortness of breath).   magic mouthwash Soln Swish with 2 teaspoonfuls by mouth as needed   meclizine 25 MG tablet Commonly known as:  ANTIVERT take 1 tablet by mouth three times a day if needed for dizziness   methocarbamol 500 MG tablet Commonly known as:  ROBAXIN Take 1 tablet 3 times per day as needed for 10 days   metoprolol succinate 25 MG 24 hr tablet Commonly known as:  TOPROL-XL take 1 tablet by mouth once daily   nystatin 100000 UNIT/ML suspension Commonly known as:  MYCOSTATIN   omeprazole 40 MG capsule Commonly known as:  PRILOSEC Take 1 capsule (40 mg total) by mouth daily.   omeprazole 20 MG capsule Commonly known as:  PRILOSEC   OXYGEN 3-3.5 liters O2 pulsed   pravastatin 20 MG tablet Commonly known as:  PRAVACHOL take 1 tablet by mouth once daily   predniSONE 10 MG tablet Commonly known as:  DELTASONE Take 1 tablet (10 mg total) by mouth daily with breakfast.   predniSONE 10 MG  tablet Commonly known as:  DELTASONE Take 4 tabs po x 2 days, then 3 x 2 days, then 2 x 2 days, then 1 x 2 days then stop.   Tiotropium Bromide-Olodaterol 2.5-2.5 MCG/ACT Aers Commonly known as:  STIOLTO RESPIMAT Inhale 2 puffs into the lungs daily.   trimethoprim 100 MG tablet Commonly known as:  TRIMPEX Reported on 09/06/2015   valsartan 160 MG tablet Commonly known as:  DIOVAN take 1 tablet by mouth once daily   VIGAMOX 0.5 % ophthalmic solution Generic drug:  moxifloxacin Reported on 09/06/2015   XOPENEX HFA 45 MCG/ACT inhaler Generic drug:  levalbuterol inhale 2 puffs INTO THE LUNGS every 6 hours if needed for wheezing or shortness of breath       Allergies:  Allergies  Allergen Reactions  . Chlordiazepoxide-Clidinium Other (See Comments)    Sleepy,weak  . Biaxin [Clarithromycin]     Foul taste, abd pain, diarrhea  . Tramadol   . Codeine     REACTION: gi upset- only in high doses per pt.  Can tolerate cough syrup with codeine.     Past Medical History:  Diagnosis Date  . Allergic rhinitis, cause unspecified    Sinus CT Rec 12-23-2009  . Arthritis   . Benign paroxysmal positional vertigo 08/09/2012  . 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 Complication of anesthesia    takes along time to wake up  . Diabetes mellitus   . Diarrhea   . Diverticulosis   . Esophageal reflux   . Esophageal stricture   . GERD (gastroesophageal reflux disease)   . Glaucoma   . Hiatal hernia   . Hypertension   . Irritable bowel syndrome   . Neuropathy in diabetes ([place]) 08/09/2012  . Other chronic nonalcoholic liver disease   . Oxygen deficiency   . Sciatica   . Shingles 2010  . Shortness of breath   . Steatohepatitis     Past Surgical History:  Procedure Laterality Date  . APPENDECTOMY    . CARPAL TUNNEL RELEASE     right hand  . CATARACT EXTRACTION     Bilateral  . CHOLECYSTECTOMY OPEN  1978  . COLONOSCOPY  07-2001    mild diverticulosis  . ESOPHAGOGASTRODUODENOSCOPY  4259,[id number]   H Hernia,es.stricture s/p dil 84F  . EYE SURGERY    . LIVER BIOPSY  08-1992  . PARATHYROID EXPLORATION    . TONSILLECTOMY AND ADENOIDECTOMY    . TOTAL ABDOMINAL HYSTERECTOMY    . ULNAR NERVE TRANSPOSITION  12/07/2011   Procedure: ULNAR NERVE DECOMPRESSION/TRANSPOSITION;this was cancelled-not done  Surgeon: Cammie Sickle., MD;  Location: [facility];  Service: Orthopedics;  Laterality: Right;  right ulnar nerve in situ decompression  . ULNAR TUNNEL RELEASE  03/07/2012   Procedure: CUBITAL TUNNEL RELEASE;  Surgeon: Roseanne Kaufman, MD;  Location: [place];  Service: Orthopedics;  Laterality: Right;  ulnar nerve release at the elbow      Family History  Problem Relation Age of Onset  . Lung cancer Mother     small cell;Byssinosis  . Heart disease Father   . Lung cancer Sister   . Liver cancer Sister     ? mets from another area of the body  . Diabetes      grandmother  . Stroke Maternal Grandfather     Social History:  reports that she quit smoking about 27 years ago. Her smoking use included Cigarettes. She has never used smokeless tobacco. She reports that she does not drink alcohol or use drugs.  Review of Systems:   LOW back pain, left hip pain: This is her main symptom today She has been to various orthopedic surgeons and sports medicine specialist She Is again asking for referral, current management not working  HYPERTENSION:   blood  pressure is well controlled and consistent  She is compliant with half tablet of 160 mg Diovan, recently decreased because of her blood pressure being low-normal  She takes Xanax at night mostly and lorazepam in the daytime for anxiety  HYPERLIPIDEMIA:  The lipid abnormality consists of elevated LDL treated with pravastatin 20 mg only which she has no side effects from    Her last LDL is  below 100   Lab Results  Component Value Date    CHOL 154 11/15/2015   HDL 39.60 11/15/2015   LDLCALC 84 11/15/2015   LDLDIRECT 89.0 10/14/2014   TRIG 151.0 (H) 11/15/2015   CHOLHDL 4 11/15/2015    She is followed by pulmonologist for chronic COPD, taking home oxygen and symptoms are cough, dyspnea and recurrent bronchitis  Dizziness- has occasional dizzy spells of unclear etiology, Tends to feel off balance but these episodes are less frequent   Diarrhea: She has had long-standing intermittent diarrhea possibly IBS. Is taking Questran as needed, recently not needing it.  Occasionally has constipation also   NEUROPATHY: She has numbness in her feet.  Also has tingling and numbness in her hands Foot exam done in 10/17: Foot exam shows sensory loss distally, normal pulses and no skin lesions   Examination:   BP 138/82   Pulse 92   Temp 98 F (36.7 C)   Resp 16   Ht 5' 3"  (1.6 m)   Wt 196 lb 6.4 oz (89.1 kg)   SpO2 93%   BMI 34.79 kg/m   Body mass index is 34.79 kg/m.     Diabetic Foot Exam - Simple   Simple Foot Form Diabetic Foot exam was performed with the following findings:  Yes 02/15/2016 10:55 AM  Visual Inspection No deformities, no ulcerations, no other skin breakdown bilaterally:  Yes Sensation Testing See comments:  Yes Pulse Check Posterior Tibialis and Dorsalis pulse intact bilaterally:  Yes Comments Distal Pedal Sensation      ASSESSMENT/ PLAN:    Diabetes type 2 with obesity See history of present illness for detailed discussion of  current management, blood sugar patterns and problems identified  The patient's diabetes control is Consistently poor with insulin resistance and tendency to higher readings nonfasting overall She is afraid to increase her NovoLog insulin and does have significant fluctuation in blood sugars with standard deviation 58 She is unable to exercise and diet is usually fairly good She does not like to take other agents for diabetes and refuses to consider Actos  again  Will give her a trial of U-500 insulin instead of NovoLog Discussed in detail how this is different and how it works as well as dosage regimen With this she will cut her Tyler Aas down to 50 units She will need to call 3-4 days after starting this to adjust her dose and periodically also Starting doses will be 40-45-40, 30 min before meals She will start next week Information on U-500 given  Pain in back and joints: she can Go back to her orthopedic surgeon.  Will now having her see Dr. Abram Sander   HYPERTENSION:  Fairly good control  Controlled by half tablet Diovan currently  NEUROPATHY: Advised her to inspect her feet daily and avoid going barefoot  Counseling time over 50% of today's 25 minute visit on above topics   Patient Instructions  Tresiba 90 units daily  Next Monday start taking U-500 Humulin R insulin as follows  Try to take this at least 20-30 minutes before your  planned meal as this is a slow insulin  Start taking 40 units before breakfast, 45 units before lunch and 50 units before supper  When starting the Humulin R reduce the Tresiba down to 50 units  Call blood sugar readings 3-4 days after starting this insulin or if the blood sugars are starting to get low    Denali Sharma 02/16/2016, 7:55 AM

## 2016-02-15 NOTE — Patient Instructions (Addendum)
Tresiba 90 units daily  Next Monday start taking U-500 Humulin R insulin as follows  Try to take this at least 20-30 minutes before your planned meal as this is a slow insulin  Start taking 40 units before breakfast, 45 units before lunch and 50 units before supper  When starting the Humulin R reduce the Tresiba down to 50 units  Call blood sugar readings 3-4 days after starting this insulin or if the blood sugars are starting to get low

## 2016-02-16 NOTE — Telephone Encounter (Signed)
Mrs. Beed called about the referral you sent to Dr. Nelva Bush, she doesn't want to see him due to the long wait and cost.  Can you please refer her to Dr. Rennis Harding at [facility]?

## 2016-02-21 ENCOUNTER — Other Ambulatory Visit: Payer: Self-pay | Admitting: Endocrinology

## 2016-02-21 DIAGNOSIS — M48061 Spinal stenosis, lumbar region without neurogenic claudication: Secondary | ICD-10-CM

## 2016-02-21 NOTE — Telephone Encounter (Signed)
She also wants to know if you can give her something for pain?

## 2016-02-21 NOTE — Telephone Encounter (Signed)
Patient wants to know if you could make an urgent referral for her about her back, she's in severe pain.

## 2016-02-21 NOTE — Telephone Encounter (Signed)
She said neither of those worked for her.  She wants to know if there is something else that will take care of the pain but not make her sleepy

## 2016-02-21 NOTE — Telephone Encounter (Signed)
Completed.

## 2016-02-21 NOTE — Telephone Encounter (Signed)
Patient ask you to call her she is in server pain. As soon as possible she ask.

## 2016-02-21 NOTE — Telephone Encounter (Signed)
We can give her hydrocortisone or oxycodone, I believe she will have to pick up the prescription

## 2016-02-21 NOTE — Telephone Encounter (Signed)
She can try fentanyl patch 25 mg to change every 3 days, prescribe 10 patches.  However this may make her drowsy also.  Do not have any alternatives

## 2016-02-22 ENCOUNTER — Telehealth: Payer: Self-pay | Admitting: Endocrinology

## 2016-02-24 ENCOUNTER — Other Ambulatory Visit: Payer: Self-pay | Admitting: Endocrinology

## 2016-02-25 ENCOUNTER — Telehealth: Payer: Self-pay | Admitting: Endocrinology

## 2016-02-25 ENCOUNTER — Telehealth: Payer: Self-pay | Admitting: Internal Medicine

## 2016-02-25 MED ORDER — PREDNISONE 50 MG PO TABS
ORAL_TABLET | ORAL | 0 refills | Status: DC
Start: 2016-02-25 — End: 2016-03-20

## 2016-02-25 NOTE — Telephone Encounter (Signed)
She declined at this time.

## 2016-02-25 NOTE — Telephone Encounter (Signed)
Offer prednisone 5 mg, # 30     1-3 daily only if needed for cough

## 2016-02-25 NOTE — Telephone Encounter (Signed)
Pt called and requested call back from [place], did not leave any other information.

## 2016-02-25 NOTE — Telephone Encounter (Signed)
Called and spoke with pt and she is aware of rx for the prednisone that has been sent to her pharmacy.

## 2016-02-25 NOTE — Telephone Encounter (Signed)
Called and spoke with pt and she is requesting a refill of the prednisone taper.  She stated that she has started with a cough again and is requesting that the taper be called in for her.  She stated that she does not have any signs of bronchitis.  CY please advise if ok to send in refill of pred taper.  Thanks  Last ov-- 01/13/16 Next ov--no pending appts with CY  Allergies  Allergen Reactions  . Chlordiazepoxide-Clidinium Other (See Comments)    Sleepy,weak  . Biaxin [Clarithromycin]     Foul taste, abd pain, diarrhea  . Tramadol   . Codeine     REACTION: gi upset- only in high doses per pt.  Can tolerate cough syrup with codeine.

## 2016-02-29 ENCOUNTER — Telehealth: Payer: Self-pay | Admitting: *Deleted

## 2016-02-29 NOTE — Telephone Encounter (Signed)
Noted, patient is aware

## 2016-02-29 NOTE — Telephone Encounter (Signed)
She can increase all her insulin doses by 10 units

## 2016-02-29 NOTE — Telephone Encounter (Signed)
[name] called today, she wanted you to know she started her new insulin yesterday, her blood sugars are as follows.  10/23- 3:46 pm-197   6:27pm-276   8:19 pm-347   1:10pm-345   10/24     2:35 am-302   7:51 am-267

## 2016-03-02 NOTE — Telephone Encounter (Signed)
Patient ask you to please call her. Before 5:00

## 2016-03-03 NOTE — Telephone Encounter (Signed)
She will continue [place] unless she is waking up with low sugars in the morning.  Confirm what dose Tyler Aas she is taking

## 2016-03-03 NOTE — Telephone Encounter (Signed)
Continue same dose [place] as indicated before

## 2016-03-03 NOTE — Telephone Encounter (Signed)
Spoke with patient and she is making the adjustments Dr. Dwyane Dee requested

## 2016-03-03 NOTE — Telephone Encounter (Signed)
She is taking 50 units tresiba

## 2016-03-08 ENCOUNTER — Other Ambulatory Visit: Payer: Self-pay | Admitting: Endocrinology

## 2016-03-08 ENCOUNTER — Telehealth: Payer: Self-pay | Admitting: Endocrinology

## 2016-03-08 MED ORDER — CIPROFLOXACIN HCL 500 MG PO TABS
500.0000 mg | ORAL_TABLET | Freq: Two times a day (BID) | ORAL | 0 refills | Status: DC
Start: 2016-03-08 — End: 2016-05-22

## 2016-03-08 NOTE — Telephone Encounter (Signed)
Pt called in and said that she has Cystitis really bad and needs Dr. Dwyane Dee to call her in something for it today so she can have it delivered asap.

## 2016-03-08 NOTE — Telephone Encounter (Signed)
See message and please advise, Thanks!  

## 2016-03-08 NOTE — Telephone Encounter (Signed)
Please ask her what symptoms she has.  Since she has very frequent infections need to do a culture before sending and Cipro again.  Please check when she got the Cipro last

## 2016-03-08 NOTE — Telephone Encounter (Signed)
I have sent a prescription for Cipro, her last prescription was 4 months ago, please let her know that we will need to do a culture if she has symptoms again

## 2016-03-08 NOTE — Telephone Encounter (Signed)
I contacted the patient and advised of message. Patient stated the symptoms she is currently experiencing is milky looking urine and burning while urinating. Patient stated the battery in her car is dead and she cannot come in to the office to provide a urine culture. Please advise, Thanks!

## 2016-03-08 NOTE — Telephone Encounter (Signed)
I contacted the patient and advised of message. Patient voiced understanding and had no further questions.

## 2016-03-15 ENCOUNTER — Ambulatory Visit: Payer: Medicare Other | Admitting: Endocrinology

## 2016-03-15 DIAGNOSIS — M542 Cervicalgia: Secondary | ICD-10-CM | POA: Diagnosis not present

## 2016-03-15 DIAGNOSIS — M419 Scoliosis, unspecified: Secondary | ICD-10-CM | POA: Diagnosis not present

## 2016-03-15 DIAGNOSIS — M47816 Spondylosis without myelopathy or radiculopathy, lumbar region: Secondary | ICD-10-CM | POA: Diagnosis not present

## 2016-03-15 DIAGNOSIS — M545 Low back pain: Secondary | ICD-10-CM | POA: Diagnosis not present

## 2016-03-20 ENCOUNTER — Telehealth: Payer: Self-pay | Admitting: Internal Medicine

## 2016-03-20 MED ORDER — CEFDINIR 300 MG PO CAPS: 300.0000 mg | ORAL_CAPSULE | Freq: Two times a day (BID) | ORAL | 0 refills | Status: DC

## 2016-03-20 MED ORDER — PREDNISONE 5 MG PO TABS: ORAL_TABLET | ORAL | 0 refills | Status: DC

## 2016-03-20 NOTE — Telephone Encounter (Signed)
Pt returning call.[name] ° °

## 2016-03-20 NOTE — Telephone Encounter (Signed)
Lmtcb. Will await call back 

## 2016-03-20 NOTE — Telephone Encounter (Signed)
Spoke with pt. She is aware of CY's recommendations. Rxs have been sent in. Nothing further was needed.

## 2016-03-20 NOTE — Telephone Encounter (Signed)
Offer cefdinir 300 mg, # 13, 1 twice daily   No ref           Prednisone 5 mg, # 30, 1-3 tabs daily only if needed for cough   No ref

## 2016-03-20 NOTE — Telephone Encounter (Signed)
Spoke with patient, states that she has bronchitis and is coughing up [place] mucus.  Denies fever. Pt states that she has noticed some increased wheezing and fatigue/weakness.  Would like antibiotic called in and needs Prednisone called in with it. Most recent Prednisone sent was incorrect.  States that she had some Prednisone sent into her pharmacy and it is the wrong dose.  Per telephone note 02/25/16 CY advised that we send Prednisone 17m to the pharmacy and Pred 528mwas sent in.  ClDeneise LeverMD to [facility] Triage Pool  02/25/16 1:57 PM  Note    Offer prednisone 5 mg, # 30     1-3 daily only if needed for cough     Pt caught the mistake before she took them. I apologized several times for the mistake and advised that this would be corrected.   Will send to Dr YoAnnamaria Bootsor antibiotic request and also to see if he wants [place] refill the previous Prednisone from 02/25/16 or if he wants to send a different Rx given her new symptoms.   Please advise Dr YoAnnamaria BootsThanks.    Allergies  Allergen Reactions  . Chlordiazepoxide-Clidinium Other (See Comments)    Sleepy,weak  . Biaxin [Clarithromycin]     Foul taste, abd pain, diarrhea  . Tramadol   . Codeine     REACTION: gi upset- only in high doses per pt.  Can tolerate cough syrup with codeine.       Medication List       Accurate as of 03/20/16 11:47 AM. Always use your most recent med list.          acetaminophen 500 MG tablet Commonly known as:  TYLENOL Take 500 mg by mouth every 6 (six) hours as needed for pain.   ALPRAZolam 0.5 MG tablet Commonly known as:  XANAX take 1/2 to 1 tablet by mouth twice a day   aspirin 325 MG tablet Take 325 mg by mouth daily.   B-D UF III MINI PEN NEEDLES 31G X 5 MM Misc Generic drug:  Insulin Pen Needle USE 5 PER DAY TO INJECT INSULIN   bacitracin ophthalmic ointment Reported on 09/06/2015   benzonatate 200 MG capsule Commonly known as:  TESSALON take 1 capsule by mouth three times a  day if needed for cough   celecoxib 100 MG capsule Commonly known as:  CELEBREX Take 1 capsule (100 mg total) by mouth 2 (two) times daily.   celecoxib 100 MG capsule Commonly known as:  CELEBREX take 1 capsule by mouth twice a day   cholestyramine 4 g packet Commonly known as:  QUESTRAN take 1 packet once daily if needed for diarrhea   ciprofloxacin 500 MG tablet Commonly known as:  CIPRO Take 1 tablet (500 mg total) by mouth 2 (two) times daily.   dicyclomine 10 MG capsule Commonly known as:  BENTYL take 1 capsule by mouth once daily   fluconazole 150 MG tablet Commonly known as:  DIFLUCAN Take 1 tablet (150 mg total) by mouth once.   glucose blood test strip Commonly known as:  ACCU-CHEK AVIVA PLUS Use as instructed to check blood sugars 8 times per day dx code 250.02   insulin aspart 100 UNIT/ML FlexPen Commonly known as:  NOVOLOG FLEXPEN Inject 3 times daily with meals. 18 units with breakfast 22 units with lunch and 24 units with supper.   Insulin Degludec 200 UNIT/ML Sopn Commonly known as:  TRESIBA FLEXTOUCH Inject 94 Units into the skin daily.  insulin regular human CONCENTRATED 500 UNIT/ML kwikpen Commonly known as:  HUMULIN R U-500 KWIKPEN Inject 50 units three times daily   Insulin Syringe-Needle U-100 31G X 15/64" 0.5 ML Misc Commonly known as:  BD INSULIN SYRINGE ULTRAFINE Use 2 per day dx code E11.65   ipratropium 17 MCG/ACT inhaler Commonly known as:  ATROVENT HFA Inhale 2 puffs into the lungs 2 (two) times daily as needed (shortness of breath).   magic mouthwash Soln Swish with 2 teaspoonfuls by mouth as needed   meclizine 25 MG tablet Commonly known as:  ANTIVERT take 1 tablet by mouth three times a day if needed for dizziness   methocarbamol 500 MG tablet Commonly known as:  ROBAXIN take 1 tablet by mouth three times a day if needed for 10 days   metoprolol succinate 25 MG 24 hr tablet Commonly known as:  TOPROL-XL take 1 tablet by  mouth once daily   nystatin 100000 UNIT/ML suspension Commonly known as:  MYCOSTATIN   omeprazole 40 MG capsule Commonly known as:  PRILOSEC Take 1 capsule (40 mg total) by mouth daily.   omeprazole 20 MG capsule Commonly known as:  PRILOSEC   OXYGEN 3-3.5 liters O2 pulsed   pravastatin 20 MG tablet Commonly known as:  PRAVACHOL take 1 tablet by mouth once daily   predniSONE 10 MG tablet Commonly known as:  DELTASONE Take 1 tablet (10 mg total) by mouth daily with breakfast.   predniSONE 10 MG tablet Commonly known as:  DELTASONE Take 4 tabs po x 2 days, then 3 x 2 days, then 2 x 2 days, then 1 x 2 days then stop.   predniSONE 50 MG tablet Commonly known as:  DELTASONE Take 1-3 tablets by mouth daily if needed for the cough   Tiotropium Bromide-Olodaterol 2.5-2.5 MCG/ACT Aers Commonly known as:  STIOLTO RESPIMAT Inhale 2 puffs into the lungs daily.   trimethoprim 100 MG tablet Commonly known as:  TRIMPEX Reported on 09/06/2015   valsartan 160 MG tablet Commonly known as:  DIOVAN take 1 tablet by mouth once daily   VIGAMOX 0.5 % ophthalmic solution Generic drug:  moxifloxacin Reported on 09/06/2015   XOPENEX HFA 45 MCG/ACT inhaler Generic drug:  levalbuterol inhale 2 puffs INTO THE LUNGS every 6 hours if needed for wheezing or shortness of breath

## 2016-03-21 ENCOUNTER — Encounter: Payer: Self-pay | Admitting: Endocrinology

## 2016-03-21 ENCOUNTER — Ambulatory Visit (INDEPENDENT_AMBULATORY_CARE_PROVIDER_SITE_OTHER): Payer: Medicare Other | Admitting: Endocrinology

## 2016-03-21 VITALS — BP 130/70 | HR 96 | Ht 63.0 in | Wt 200.0 lb

## 2016-03-21 DIAGNOSIS — Z794 Long term (current) use of insulin: Secondary | ICD-10-CM | POA: Diagnosis not present

## 2016-03-21 DIAGNOSIS — K580 Irritable bowel syndrome with diarrhea: Secondary | ICD-10-CM | POA: Diagnosis not present

## 2016-03-21 DIAGNOSIS — N390 Urinary tract infection, site not specified: Secondary | ICD-10-CM

## 2016-03-21 DIAGNOSIS — IMO0002 Reserved for concepts with insufficient information to code with codable children: Secondary | ICD-10-CM

## 2016-03-21 DIAGNOSIS — E1140 Type 2 diabetes mellitus with diabetic neuropathy, unspecified: Secondary | ICD-10-CM

## 2016-03-21 DIAGNOSIS — E1165 Type 2 diabetes mellitus with hyperglycemia: Secondary | ICD-10-CM

## 2016-03-21 LAB — COMPREHENSIVE METABOLIC PANEL
ALBUMIN: 4 g/dL (ref 3.5–5.2)
ALT: 20 U/L (ref 0–35)
AST: 28 U/L (ref 0–37)
Alkaline Phosphatase: 49 U/L (ref 39–117)
BUN: 13 mg/dL (ref 6–23)
CALCIUM: 9.4 mg/dL (ref 8.4–10.5)
CHLORIDE: 96 meq/L (ref 96–112)
CO2: 32 mEq/L (ref 19–32)
CREATININE: 0.77 mg/dL (ref 0.40–1.20)
GFR: 76.91 mL/min (ref >60.00)
Glucose, Bld: 239 mg/dL — ABNORMAL HIGH (ref 70–99)
POTASSIUM: 4.4 meq/L (ref 3.5–5.1)
Sodium: 134 mEq/L — ABNORMAL LOW (ref 135–145)
Total Bilirubin: 0.3 mg/dL (ref 0.2–1.2)
Total Protein: 7.4 g/dL (ref 6.0–8.3)

## 2016-03-21 LAB — CBC WITH DIFFERENTIAL/PLATELET
BASOS PCT: 0.6 % (ref 0.0–3.0)
Basophils Absolute: 0 10*3/uL (ref 0.0–0.1)
EOS ABS: 0.3 10*3/uL (ref 0.0–0.7)
Eosinophils Relative: 3.9 % (ref 0.0–5.0)
HEMATOCRIT: 40.3 % (ref 36.0–46.0)
HEMOGLOBIN: 12.9 g/dL (ref 12.0–15.0)
LYMPHS PCT: 39.5 % (ref 12.0–46.0)
Lymphs Abs: 3.3 10*3/uL (ref 0.7–4.0)
MCHC: 31.9 g/dL (ref 30.0–36.0)
MCV: 83.8 fl (ref 78.0–100.0)
MONOS PCT: 5.2 % (ref 3.0–12.0)
Monocytes Absolute: 0.4 10*3/uL (ref 0.1–1.0)
Neutro Abs: 4.3 10*3/uL (ref 1.4–7.7)
Neutrophils Relative %: 50.8 % (ref 43.0–77.0)
Platelets: 196 10*3/uL (ref 150.0–400.0)
RBC: 4.81 Mil/uL (ref 3.87–5.11)
RDW: 14.8 % (ref 11.5–15.5)
WBC: 8.4 10*3/uL (ref 4.0–10.5)

## 2016-03-21 LAB — POCT URINALYSIS DIPSTICK
Bilirubin, UA: NEGATIVE
GLUCOSE UA: NEGATIVE
Ketones, UA: NEGATIVE
NITRITE UA: NEGATIVE
SPEC GRAV UA: 1.02
UROBILINOGEN UA: 0.2
pH, UA: 6

## 2016-03-21 NOTE — Addendum Note (Signed)
Addended by: Kaylyn Lim I on: 03/21/2016 02:08 PM   Modules accepted: Orders

## 2016-03-21 NOTE — Patient Instructions (Addendum)
Supper dose of U-500 to be 25  AM DOSE TO 45 UNITS, STAY ON 45 AT LUNCH  May go up 5 units if sugar > 250

## 2016-03-21 NOTE — Addendum Note (Signed)
Addended by: Kaylyn Lim I on: 03/21/2016 02:05 PM   Modules accepted: Orders

## 2016-03-21 NOTE — Progress Notes (Signed)
Patient ID: [name], female   DOB: [birth year], 80 y.o.   MRN: [id number]   Reason for Appointment: Diabetes and follow-up of multiple problems  History of Present Illness    PROBLEM 1:  DIABETES type II since 1980   [name] has been on insulin using Lantus and Humalog for several years and usually A1c around 8% despite fairly good compliance with diet, exercise and glucose monitoring. [name] also had been on Actos previously which probably benefited [name] especially with [name] fatty liver but [name] stopped it because of fear of side effects      Recent history:   Insulin regimen: Tresiba  50 acs Humulin R U-500 before meals: 40-45-30   [name] A1c has been consistently around 9- 10%   [name] was changed from NovoLog to U-500 insulin starting in 02/2016  Although blood sugars are still fluctuating with no consistent pattern [name] appears to be improving [name]  Because of low-normal readings at bedtime [name] was reduced to 30 units  HIGHEST blood sugars around midday  Has the most fluctuation in blood sugars late at night and overnight  [name] has various infections and with the increase in stress level at times along with pain may be causing variability in [name] blood sugars  [name] has been continued at the lower [name], previously taking at least 80 units  No [name] with lowest blood sugar 86  [name] and feels some symptoms with low normal sugars  [name]  Oral hypoglycemic drugs: none       Side effects from medications: None         Proper timing of medications in relation to meals: Yes.          Monitors blood glucose:  5 times a day   Glucometer:  Accu-Chek         Blood Glucose averages recently from meter download:  Mean values apply above for all meters except median for One Touch  PRE-MEAL Fasting Lunch Dinner Bedtime Overall  Glucose range: 105-267  171-331  97-336   102-332    Mean/median: 189  237  211  221  210+/-60    OvernightRange 111-352 with AVERAGE 187, previously 220   Meals: 3 meals per day.  Smaller [name]; lunch 1-2 pm Supper 6 to 7 PM     Physical activity: exercise: Unable to do any because of dyspnea and fatigue            Dietician visit: Most recent: Several years ago           Complications: are: Peripheral neuropathy with sensory loss     Wt Readings from Last 3 Encounters:  03/21/16 200 lb (90.7 kg)  02/15/16 196 lb 6.4 oz (89.1 kg)  01/13/16 197 lb 9.6 oz (89.6 kg)    Lab Results  Component Value Date   HGBA1C 10.1 02/15/2016   HGBA1C 10.1 02/15/2016   HGBA1C 10.5 (H) 11/15/2015   Lab Results  Component Value Date   MICROALBUR 6.9 (H) 10/14/2014   LDLCALC 84 11/15/2015   CREATININE 0.70 11/15/2015     OTHER Current problems  are detailed in review of systems     Medication List       Accurate as of 03/21/16 12:20 PM. Always use your most recent med list.          acetaminophen 500 MG tablet Commonly known as:  TYLENOL Take 500 mg by mouth every 6 (six) hours as needed for pain.   ALPRAZolam 0.5 MG tablet Commonly known as:  XANAX take 1/2 to 1 tablet by mouth twice a day   aspirin 325 MG tablet Take 325 mg by mouth daily.   B-D UF III MINI PEN NEEDLES 31G X 5 MM Misc Generic drug:  Insulin Pen Needle USE 5 PER DAY TO INJECT INSULIN   bacitracin ophthalmic ointment Reported on 09/06/2015   benzonatate 200 MG capsule Commonly known as:  TESSALON take 1 capsule by mouth three times a day if needed for cough   cefdinir 300 MG capsule Commonly known as:  OMNICEF Take 1 capsule (300 mg total) by mouth 2 (two) times daily.   celecoxib [name] MG capsule Commonly known as:  CELEBREX Take 1 capsule ([name] mg total) by mouth 2 (two) times daily.   celecoxib [name] MG capsule Commonly known as:  CELEBREX take 1 capsule by mouth twice a day   cholestyramine 4 g packet Commonly known as:   QUESTRAN take 1 packet once daily if needed for diarrhea   ciprofloxacin 500 MG tablet Commonly known as:  CIPRO Take 1 tablet (500 mg total) by mouth 2 (two) times daily.   dicyclomine 10 MG capsule Commonly known as:  BENTYL take 1 capsule by mouth once daily   fluconazole 150 MG tablet Commonly known as:  DIFLUCAN Take 1 tablet (150 mg total) by mouth once.   glucose blood test strip Commonly known as:  ACCU-CHEK AVIVA PLUS Use as instructed to check blood sugars 8 times per day dx code 250.02   insulin aspart [name] UNIT/ML FlexPen Commonly known as:  NOVOLOG FLEXPEN Inject 3 times daily with meals. 18 units with breakfast 22 units with lunch and 24 units with supper.   Insulin Degludec 200 UNIT/ML Sopn Commonly known as:  TRESIBA FLEXTOUCH Inject 94 Units into the skin daily.   insulin regular human CONCENTRATED 500 UNIT/ML kwikpen Commonly known as:  HUMULIN R U-500 KWIKPEN Inject 50 units three times daily   Insulin Syringe-Needle U-[name] 31G X 15/64" 0.5 ML Misc Commonly known as:  BD INSULIN SYRINGE ULTRAFINE Use 2 per day dx code E11.65   magic mouthwash Soln Swish with 2 teaspoonfuls by mouth as needed   meclizine 25 MG tablet Commonly known as:  ANTIVERT take 1 tablet by mouth three times a day if needed for dizziness   methocarbamol 500 MG tablet Commonly known as:  ROBAXIN take 1 tablet by mouth three times a day if needed for 10 days   metoprolol succinate 25 MG 24 hr tablet Commonly known as:  TOPROL-XL take 1 tablet by mouth once daily   nystatin 100000 UNIT/ML suspension Commonly known as:  MYCOSTATIN   omeprazole 40 MG capsule Commonly known as:  PRILOSEC Take 1 capsule (40 mg total) by mouth daily.   omeprazole 20 MG capsule Commonly known as:  PRILOSEC   OXYGEN 3-3.5 liters O2 pulsed   pravastatin 20 MG tablet Commonly known as:  PRAVACHOL take 1 tablet by mouth once daily   predniSONE 10 MG tablet Commonly known as:  DELTASONE Take  1 tablet (10 mg total) by mouth daily with breakfast.   predniSONE 5 MG tablet Commonly known as:  DELTASONE 1-3 tabs daily only if needed for cough   Tiotropium Bromide-Olodaterol 2.5-2.5 MCG/ACT Aers Commonly known as:  STIOLTO RESPIMAT Inhale 2 puffs into the lungs daily.   trimethoprim [name] MG tablet Commonly known  as:  TRIMPEX Reported on 09/06/2015   valsartan 160 MG tablet Commonly known as:  [name] take 1 tablet by mouth once daily   VIGAMOX 0.5 % ophthalmic solution Generic drug:  moxifloxacin Reported on 09/06/2015   XOPENEX HFA 45 MCG/ACT inhaler Generic drug:  levalbuterol inhale 2 puffs INTO THE LUNGS every 6 hours if needed for wheezing or shortness of breath       Allergies:  Allergies  Allergen Reactions  . Chlordiazepoxide-Clidinium Other (See Comments)    Sleepy,weak  . Biaxin [Clarithromycin]     Foul taste, abd pain, diarrhea  . Tramadol   . Codeine     REACTION: gi upset- only in high doses per pt.  Can tolerate cough syrup with codeine.     Past Medical History:  Diagnosis Date  . Allergic rhinitis, cause unspecified    Sinus CT Rec 12-23-2009  . Arthritis   . Benign paroxysmal positional vertigo 08/09/2012  . 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 Complication of anesthesia    takes along time to wake up  . Diabetes mellitus   . Diarrhea   . Diverticulosis   . Esophageal reflux   . Esophageal stricture   . GERD (gastroesophageal reflux disease)   . Glaucoma   . Hiatal hernia   . Hypertension   . Irritable bowel syndrome   . Neuropathy in diabetes ([place]) 08/09/2012  . Other chronic nonalcoholic liver disease   . Oxygen deficiency   . Sciatica   . Shingles 2010  . Shortness of breath   . Steatohepatitis     Past Surgical History:  Procedure Laterality Date  . APPENDECTOMY    . CARPAL TUNNEL RELEASE     right hand  . CATARACT EXTRACTION     Bilateral  . CHOLECYSTECTOMY OPEN   1978  . COLONOSCOPY  07-2001   mild diverticulosis  . ESOPHAGOGASTRODUODENOSCOPY  6606,30-16   H Hernia,es.stricture s/p dil 79F  . EYE SURGERY    . LIVER BIOPSY  08-1992  . PARATHYROID EXPLORATION    . TONSILLECTOMY AND ADENOIDECTOMY    . TOTAL ABDOMINAL HYSTERECTOMY    . ULNAR NERVE TRANSPOSITION  12/07/2011   Procedure: ULNAR NERVE DECOMPRESSION/TRANSPOSITION;this was cancelled-not done  Surgeon: Cammie Sickle., MD;  Location: [facility];  Service: Orthopedics;  Laterality: Right;  right ulnar nerve in situ decompression  . ULNAR TUNNEL RELEASE  03/07/2012   Procedure: CUBITAL TUNNEL RELEASE;  Surgeon: Roseanne Kaufman, MD;  Location: [facility];  Service: Orthopedics;  Laterality: Right;  ulnar nerve release at the elbow      Family History  Problem Relation Age of Onset  . Lung cancer Mother     small cell;Byssinosis  . Heart disease Father   . Lung cancer Sister   . Liver cancer Sister     ? mets from another area of the body  . Diabetes      grandmother  . Stroke Maternal Grandfather     Social History:  reports that [name] quit smoking about 27 years ago. [name] smoking use included Cigarettes. [name] has never used smokeless tobacco. [name] reports that [name] does not drink alcohol or use drugs.  Review of Systems:  [name] about a UTI with some discomfort and cloudy urine. [name] that even after taking Cipro [name] is now having some cloudy urine Urine dipstick is showing moderate positive leukocytes today  LOW back  pain, left hip pain:  [name] has been to various orthopedic surgeons and And now is planning to have a procedure done by the spine surgery, apparently an nerve block and fusion  HYPERTENSION:   blood pressure is well controlled and consistent  [name]  [name]  HYPERLIPIDEMIA:  The lipid abnormality consists of elevated LDL  treated with pravastatin 20 mg  which [name] has no side effects from    [name] last LDL is  below [name]   Lab Results  Component Value Date   CHOL 154 11/15/2015   HDL 39.60 11/15/2015   LDLCALC 84 11/15/2015   LDLDIRECT 89.0 10/14/2014   TRIG 151.0 (H) 11/15/2015   CHOLHDL 4 11/15/2015    [name], taking home oxygen and symptoms are cough, dyspnea and recurrent bronchitis, apparently he is getting new prescriptions prescribed yesterday from pulmonologist  Dizziness- has occasional dizzy spells of unclear etiology, Tends to feel off balance Recently not symptomatic   Diarrhea: [name] has had long-standing intermittent diarrhea possibly IBS. Is taking Questran as needed  Occasionally has constipation also  NEUROPATHY: [name] has numbness in [name] feet.  Also has tingling and numbness in [name] hands Foot exam done in 10/17: Foot exam shows sensory loss distally, normal pulses and no skin lesions   Examination:   BP 130/70   Pulse 96   Ht 5' 3"  (1.6 m)   Wt 200 lb (90.7 kg)   SpO2 90%   BMI 35.43 kg/m   Body mass index is 35.43 kg/m.      ASSESSMENT/ PLAN:    Diabetes type 2 with obesity See history of present illness for detailed discussion of  current management, blood sugar patterns and problems identified  [name] appears to be having some improvement in blood sugar [name] with the U-500 insulin instead of large doses of basal insulin and also NovoLog Currently blood sugar patterns show improving fasting readings, higher readings midday and early afternoon and tendency to much better readings at bedtime and overnight [name] has been on the new doses for a month and will wait to check [name] A1c until the next visit  Recommendations: [name] will increase [name] morning [name] to 45 units and decrease suppertime [name] down to 25 Continue same [name] of [place] [name] can use extra 5 units or more of the U-500 insulin when [name] blood sugars are at least over 200 at  a meal May need more than that if [name] gets steroids  UTI: [name] appears to have some persistent pyuria and will do a culture May need to have [name] go back to the urologist if [name] culture is not conclusive  HYPERTENSION:  Fairly good [name]     Patient Instructions  Supper [name] of U-500 to be 25  AM [name] TO 45 UNITS, STAY ON 45 AT LUNCH  May go up 5 units if sugar > 250    Casandra Dallaire 03/21/2016, 12:20 PM

## 2016-03-23 LAB — URINE CULTURE

## 2016-03-23 NOTE — Progress Notes (Signed)
Please let patient know that the culture does not show any significant infection and no further Rx needed

## 2016-03-27 ENCOUNTER — Telehealth: Payer: Self-pay | Admitting: Endocrinology

## 2016-03-27 ENCOUNTER — Other Ambulatory Visit: Payer: Self-pay | Admitting: Endocrinology

## 2016-03-27 DIAGNOSIS — M7552 Bursitis of left shoulder: Secondary | ICD-10-CM | POA: Diagnosis not present

## 2016-03-27 DIAGNOSIS — M545 Low back pain: Secondary | ICD-10-CM | POA: Diagnosis not present

## 2016-03-27 DIAGNOSIS — M791 Myalgia: Secondary | ICD-10-CM | POA: Diagnosis not present

## 2016-03-27 DIAGNOSIS — M47816 Spondylosis without myelopathy or radiculopathy, lumbar region: Secondary | ICD-10-CM | POA: Diagnosis not present

## 2016-03-27 DIAGNOSIS — M419 Scoliosis, unspecified: Secondary | ICD-10-CM | POA: Diagnosis not present

## 2016-03-27 NOTE — Telephone Encounter (Signed)
Pt called in and said that she needs a call back from Dr. Ronnie Derby covering nurse to discuss some medication questions she had.

## 2016-03-27 NOTE — Telephone Encounter (Signed)
She said she will call tomorrow bc she has to go through her meter and didn't have it in fornt of her.

## 2016-03-27 NOTE — Telephone Encounter (Signed)
Pt received a steroid injection on her back her BG have been fluctuating they have dropped yesterday before dinner was 82 and it's normally around 200. Please advise thanks.

## 2016-03-27 NOTE — Telephone Encounter (Signed)
Need to know exactly what her blood sugars are for 3 days including today and insulin doses she has taken

## 2016-03-28 NOTE — Telephone Encounter (Signed)
eScribe request from [facility] for refill on Alprazolam 0.5 mg Last filled - 07/07/15, #[id number] Last AEX - 03/21/16 Please Advise on refills/SLS 11/21

## 2016-03-28 NOTE — Telephone Encounter (Signed)
Patient stated that she need her medication Xanax, and omeprazole (PRILOSEC) 20 MG capsule  [id number], [place] [phone] (Phone) [phone] (Fax)

## 2016-03-28 NOTE — Telephone Encounter (Signed)
Rx request faxed to pharmacy/SLS  

## 2016-03-29 ENCOUNTER — Other Ambulatory Visit: Payer: Self-pay

## 2016-03-29 MED ORDER — OMEPRAZOLE 40 MG PO CPDR: 40.0000 mg | DELAYED_RELEASE_CAPSULE | Freq: Every day | ORAL | 2 refills | Status: DC

## 2016-03-29 MED ORDER — ALPRAZOLAM 0.5 MG PO TABS: ORAL_TABLET | ORAL | 5 refills | Status: DC

## 2016-03-29 NOTE — Telephone Encounter (Signed)
Patient state that the pharmacy haven't received medication. And ask if you received an her blood sugar reading.. Please advise.

## 2016-03-29 NOTE — Telephone Encounter (Signed)
Ordered 03/29/16

## 2016-03-29 NOTE — Telephone Encounter (Signed)
Pt is stating that [facility] has not received the xanax and prilosec rx, please follow up and let the pt know once called in.

## 2016-04-03 ENCOUNTER — Telehealth: Payer: Self-pay | Admitting: Endocrinology

## 2016-04-03 ENCOUNTER — Other Ambulatory Visit: Payer: Self-pay

## 2016-04-03 MED ORDER — OMEPRAZOLE 20 MG PO CPDR: 20.0000 mg | DELAYED_RELEASE_CAPSULE | Freq: Every day | ORAL | 2 refills | Status: DC

## 2016-04-03 NOTE — Telephone Encounter (Signed)
Wrong prilosec mg were called in please correct the dosing its supposed to be 20 mg per pt

## 2016-04-05 ENCOUNTER — Telehealth: Payer: Self-pay | Admitting: Endocrinology

## 2016-04-05 NOTE — Telephone Encounter (Signed)
Pt called this morning and said that she woke up and her sugar was over 300.  All she has eaten is a hard boiled egg and it is now 316.  She needs to know what she needs to do.

## 2016-04-05 NOTE — Telephone Encounter (Signed)
Need to know what her blood sugars are for the last 3 days and insulin doses.

## 2016-04-05 NOTE — Telephone Encounter (Signed)
Pt is not well, she is having terrible highs and lows, she wants Dr. Dwyane Dee to help now. She is upset and states that she believes Dr. Dwyane Dee has her on too much insulin and expects him to change it immediately and if he wont then she wants his referral to another MD.

## 2016-04-05 NOTE — Telephone Encounter (Signed)
She can go back to the Novolog instead of the U-500:  Novolog doses will be 20 at breakfast, 24 lunch and 30 at supper, may reduce the doses by 4-6 units if eating smaller meal.  She will increase her Tresiba dose to 70 units also from tomorrow

## 2016-04-05 NOTE — Telephone Encounter (Signed)
BS  11/27 AM 179; 204; 231; 185; 179; 245 11/28 AM 103; 222; 189; 232; 279;  11/29 286; 309; 316; without food 11/29 176 after breakfast 66; sick had coke went back up 228; no insulin at this time; right now it is 220 still no insulin  Night sweats are terrible started since the new insulin and appetite is up

## 2016-04-05 NOTE — Telephone Encounter (Signed)
Pt is aware of the below

## 2016-04-11 NOTE — Telephone Encounter (Signed)
Patient ask you to give her a call, she has a question to ask you

## 2016-04-12 NOTE — Telephone Encounter (Signed)
Patient wanted information about neck surgery she will be having, and wanted to know if I knew anything about this kind of surgery, or the doctor preforming the surgery.  I told her I knew nothing about this, and never heard of the doctor.  Says is in much neck pain.

## 2016-04-17 ENCOUNTER — Telehealth: Payer: Self-pay | Admitting: Internal Medicine

## 2016-04-17 NOTE — Telephone Encounter (Signed)
Called and spoke to pt. Informed her of the recs per CY. Pt verbalized understanding and denied any further questions or concerns at this time.   

## 2016-04-17 NOTE — Telephone Encounter (Signed)
Sounds likje a viral infection. Stay well hydrated and comfortably warm. Ok to continue tessalon while needed. Ok to skip doses and to use an otc cough syrup, throat lozenges, sips of liquids instead of, or in-between doses of tessalon.

## 2016-04-17 NOTE — Telephone Encounter (Signed)
Pressure against side of nose If necessary, can use a spray of otc Afrin nasal spray into the bleeding nostril to close blood vesels for not more than a day or two. After that, use otc nasal saline gel to soothe and protect the lining of the nose.

## 2016-04-17 NOTE — Telephone Encounter (Signed)
Pt is concerned about her nose bleed and would like recommendations. Pt aware of CY recommendations, pt reports the OTC cough syrups do not work for her.   CY please advise.

## 2016-04-17 NOTE — Telephone Encounter (Signed)
CY  Please Advise-Sick Message   Pt. C/o still coughing but hard to produce anything, nose bleeds over the weekend with some clots only out of the left nostril, body aches, increase sob, Pt. Wanted to clarify with you how long you wanted her to take the tessalon, she has been taking it as needed but concerned about taking it for a long time.    Denies fever

## 2016-04-23 ENCOUNTER — Other Ambulatory Visit: Payer: Self-pay | Admitting: Endocrinology

## 2016-04-28 ENCOUNTER — Telehealth: Payer: Self-pay | Admitting: Endocrinology

## 2016-04-28 NOTE — Telephone Encounter (Signed)
bp is 161/62 and pt is feeling very shaky over the last 3-4 days. She has lost her balance a few times and has been growing weaker she feels. BS are getting low now consistently.

## 2016-05-11 ENCOUNTER — Telehealth: Payer: Self-pay | Admitting: Endocrinology

## 2016-05-11 NOTE — Telephone Encounter (Signed)
Patient stated she is having troubles with her b/s being elevated. She stated no has called her. Please advise.

## 2016-05-11 NOTE — Telephone Encounter (Signed)
Patient stated she noticed this happening for the first time yesterday.

## 2016-05-11 NOTE — Telephone Encounter (Signed)
Has her blood sugar being high every morning?  Is at high consistently at any other time?

## 2016-05-11 NOTE — Telephone Encounter (Signed)
Insulin dose will be changed only if blood sugars are consistently high at 3 days in a row, she can take extra 5-8 units of Novolog when blood sugars are over 200

## 2016-05-11 NOTE — Telephone Encounter (Signed)
I contacted the patient and advised of message and she voiced understanding. Patient had no further questions at this time.

## 2016-05-11 NOTE — Telephone Encounter (Signed)
I contacted the patient and she advised me this morning her fasting blood sugar this morning was 233 and last night before bed it was 152 . Patient stated she has been taking 70 units of the tresiba and 20 units of the novolog. Patient stated at lunch her sugar was 233

## 2016-05-15 DIAGNOSIS — M47816 Spondylosis without myelopathy or radiculopathy, lumbar region: Secondary | ICD-10-CM | POA: Diagnosis not present

## 2016-05-16 ENCOUNTER — Telehealth (INDEPENDENT_AMBULATORY_CARE_PROVIDER_SITE_OTHER): Payer: Self-pay | Admitting: Radiology

## 2016-05-16 NOTE — Telephone Encounter (Signed)
I contacted the patient and she wanted to report her blood sugar readings.  05/13/2016: 3:02 am: 195 8:19 am: 175 10:25 am: 252 1:58 pm: 259 5:20 pm: 205 11:09 pm: 257  05/14/2016: 7:45 am: 129 10:57 am: 188 4:55 pm: 278  05/15/2016:  5:43 am: 129 9:04 am: 215 9:55 am: 200 1:45 pm: 149 5:59 pm: 181 9:45 pm: 95  05/16/2016:  5:55 am: 178 12:20 pm: 226  Patient wanted you to be advised of these numbers. She also wanted to advise you she had a procedure done on her back yesterday that will hopefully help with pain she has been experiencing, but she did not receive anything that will raise her blood sugars.

## 2016-05-16 NOTE — Telephone Encounter (Signed)
Attempted to reach the patient. Patient's phone number was busy, will try again at a later time.

## 2016-05-16 NOTE — Telephone Encounter (Signed)
Called requesting images from when she was seen by Dr. Lemar Livings.  CD has been made and patient is aware.

## 2016-05-16 NOTE — Telephone Encounter (Signed)
Only required changes to increase her NovoLog at breakfast by 4 units.  Also confirm what dose she is taking

## 2016-05-16 NOTE — Telephone Encounter (Signed)
Patient stated she has the information you needed.

## 2016-05-17 NOTE — Telephone Encounter (Signed)
I contacted the patient and advised of message. She confirmed she was taking 20 units at breakfast, 24 at lunch and 30 units at supper of the Novolog and 70 units of the [place] daily. Patient will increase the breakfast dosage to 24 units.

## 2016-05-18 ENCOUNTER — Telehealth: Payer: Self-pay | Admitting: Internal Medicine

## 2016-05-18 NOTE — Telephone Encounter (Signed)
Spoke with pt and advised of Dr Janee Morn recommendations.  Pt verbalized understanding.  Nothing further needed.

## 2016-05-18 NOTE — Telephone Encounter (Signed)
If there is even a neighbor's apartment or a lobby where she can go to sit while work is being done, that may help. Ok to wear a dust and pollen mask as needed. We caould probably give her a couple, or she can get them from drug store.

## 2016-05-18 NOTE — Telephone Encounter (Signed)
Spoke with the pt  She states that she is not currently having any SOB, as the msg states She states that her apartment complex is getting ready to replace the floors and paint  She is concerned about the fumes that will be involved and wonders what CDY thinks would help best  She does not have anywhere else to go  She states that the masks at the paint store are expensive and wants to know if we have one here that [place] thinks would help

## 2016-05-22 ENCOUNTER — Other Ambulatory Visit: Payer: Self-pay | Admitting: Endocrinology

## 2016-05-22 MED ORDER — CIPROFLOXACIN HCL 500 MG PO TABS: 500.0000 mg | ORAL_TABLET | Freq: Two times a day (BID) | ORAL | 0 refills | Status: DC

## 2016-05-22 NOTE — Telephone Encounter (Signed)
Pt is having a flare up of cystitis pt is requesting cipro If ok [facility]

## 2016-05-22 NOTE — Telephone Encounter (Signed)
Patient  Is calling on the status of Medication cipro for her cystitis.  She need it today she says.

## 2016-05-22 NOTE — Telephone Encounter (Signed)
Done

## 2016-05-22 NOTE — Telephone Encounter (Signed)
Please call [phone] Freda Munro, friend of Ms Gang will be with her today

## 2016-05-22 NOTE — Telephone Encounter (Signed)
[name] is calling about her cipro being sent to her pharmacy.

## 2016-05-26 ENCOUNTER — Telehealth: Payer: Self-pay | Admitting: Internal Medicine

## 2016-05-26 MED ORDER — AZITHROMYCIN 250 MG PO TABS: ORAL_TABLET | ORAL | 0 refills | Status: DC

## 2016-05-26 NOTE — Telephone Encounter (Signed)
Offer Zpak    250 mg, # 6, 2 today then one daily 

## 2016-05-26 NOTE — Telephone Encounter (Signed)
Pt c/o cough with congestion and Tyrika Newman mucus production x 4-5 days.  Not using OTC meds for symptom control.  Pt states that she is having increased SOB. Denies fever.  Pt feels that she is getting bronchitis and is wanting something called in to the pharmacy.   [facility] [address]  Please advise Dr Annamaria Boots. Thanks.   Allergies  Allergen Reactions  . Chlordiazepoxide-Clidinium Other (See Comments)    Sleepy,weak  . Biaxin [Clarithromycin]     Foul taste, abd pain, diarrhea  . Tramadol   . Codeine     REACTION: gi upset- only in high doses per pt.  Can tolerate cough syrup with codeine.    Allergies as of 05/26/2016      Reactions   Chlordiazepoxide-clidinium Other (See Comments)   Sleepy,weak   Biaxin [clarithromycin]    Foul taste, abd pain, diarrhea   Tramadol    Codeine    REACTION: gi upset- only in high doses per pt.  Can tolerate cough syrup with codeine.       Medication List       Accurate as of 05/26/16  1:05 PM. Always use your most recent med list.          acetaminophen 500 MG tablet Commonly known as:  TYLENOL Take 500 mg by mouth every 6 (six) hours as needed for pain.   ALPRAZolam 0.5 MG tablet Commonly known as:  XANAX take 1/2 to 1 tablet by mouth twice a day   aspirin 325 MG tablet Take 325 mg by mouth daily.   B-D UF III MINI PEN NEEDLES 31G X 5 MM Misc Generic drug:  Insulin Pen Needle USE 5 PER DAY TO INJECT INSULIN   B-D ULTRAFINE III SHORT PEN 31G X 8 MM Misc Generic drug:  Insulin Pen Needle TEST 3 TIMES A DAY   bacitracin ophthalmic ointment Reported on 09/06/2015   benzonatate 200 MG capsule Commonly known as:  TESSALON take 1 capsule by mouth three times a day if needed for cough   cefdinir 300 MG capsule Commonly known as:  OMNICEF Take 1 capsule (300 mg total) by mouth 2 (two) times daily.   celecoxib 100 MG capsule Commonly known as:  CELEBREX Take 1 capsule (100 mg total) by mouth 2 (two) times daily.   celecoxib 100  MG capsule Commonly known as:  CELEBREX take 1 capsule by mouth twice a day   cholestyramine 4 g packet Commonly known as:  QUESTRAN take 1 packet once daily if needed for diarrhea   ciprofloxacin 500 MG tablet Commonly known as:  CIPRO Take 1 tablet (500 mg total) by mouth 2 (two) times daily.   dicyclomine 10 MG capsule Commonly known as:  BENTYL take 1 capsule by mouth once daily   fluconazole 150 MG tablet Commonly known as:  DIFLUCAN Take 1 tablet (150 mg total) by mouth once.   glucose blood test strip Commonly known as:  ACCU-CHEK AVIVA PLUS Use as instructed to check blood sugars 8 times per day dx code 250.02   insulin aspart 100 UNIT/ML FlexPen Commonly known as:  NOVOLOG FLEXPEN Inject 3 times daily with meals. 18 units with breakfast 22 units with lunch and 24 units with supper.   Insulin Degludec 200 UNIT/ML Sopn Commonly known as:  TRESIBA FLEXTOUCH Inject 94 Units into the skin daily.   insulin regular human CONCENTRATED 500 UNIT/ML kwikpen Commonly known as:  HUMULIN R U-500 KWIKPEN Inject 50 units three times daily  Insulin Syringe-Needle U-100 31G X 15/64" 0.5 ML Misc Commonly known as:  BD INSULIN SYRINGE ULTRAFINE Use 2 per day dx code E11.65   magic mouthwash Soln Swish with 2 teaspoonfuls by mouth as needed   meclizine 25 MG tablet Commonly known as:  ANTIVERT take 1 tablet by mouth three times a day if needed for dizziness   methocarbamol 500 MG tablet Commonly known as:  ROBAXIN take 1 tablet by mouth three times a day if needed for 10 days   metoprolol succinate 25 MG 24 hr tablet Commonly known as:  TOPROL-XL take 1 tablet by mouth once daily   nystatin 100000 UNIT/ML suspension Commonly known as:  MYCOSTATIN   omeprazole 20 MG capsule Commonly known as:  PRILOSEC Take 1 capsule (20 mg total) by mouth daily.   OXYGEN 3-3.5 liters O2 pulsed   pravastatin 20 MG tablet Commonly known as:  PRAVACHOL take 1 tablet by mouth once  daily   predniSONE 10 MG tablet Commonly known as:  DELTASONE Take 1 tablet (10 mg total) by mouth daily with breakfast.   predniSONE 5 MG tablet Commonly known as:  DELTASONE 1-3 tabs daily only if needed for cough   Tiotropium Bromide-Olodaterol 2.5-2.5 MCG/ACT Aers Commonly known as:  STIOLTO RESPIMAT Inhale 2 puffs into the lungs daily.   trimethoprim 100 MG tablet Commonly known as:  TRIMPEX Reported on 09/06/2015   valsartan 160 MG tablet Commonly known as:  DIOVAN take 1 tablet by mouth once daily   VIGAMOX 0.5 % ophthalmic solution Generic drug:  moxifloxacin Reported on 09/06/2015   XOPENEX HFA 45 MCG/ACT inhaler Generic drug:  levalbuterol inhale 2 puffs INTO THE LUNGS every 6 hours if needed for wheezing or shortness of breath

## 2016-05-26 NOTE — Telephone Encounter (Signed)
Pt aware of CY's recommendations & voiced her understanding. Rx sent to preferred pharmacy. Nothing further needed.

## 2016-05-29 DIAGNOSIS — M47816 Spondylosis without myelopathy or radiculopathy, lumbar region: Secondary | ICD-10-CM | POA: Diagnosis not present

## 2016-05-30 ENCOUNTER — Telehealth: Payer: Self-pay | Admitting: Internal Medicine

## 2016-05-30 MED ORDER — PREDNISONE 10 MG PO TABS: 10.0000 mg | ORAL_TABLET | Freq: Every day | ORAL | 0 refills | Status: DC

## 2016-05-30 NOTE — Telephone Encounter (Signed)
Ok to clean up med list by taking finished antibiotics off- Zpak, cefdinir, cipro  She is completing antibiotic. This is probably a viral infection so unlikely that more antibiotics will help unless fever/ green, etc.  We can offer gentle prednisone 10 mg daily, # 6     If she is willing to have it raise her blood sugar some for a few days.  Ok to take otc cold and flu symptom meds as needed, like Theraflu, Tylenol cold And Flu, etc.

## 2016-05-30 NOTE — Telephone Encounter (Signed)
Spoke with pt. States that she has bronchitis. Reports cough, runny nose, sneezing, chest tightness and wheezing. Cough at times with produce clear mucus. Denies SOB or fever. Has been taking OTC cough syrup and Claritin with no relief. Believes that cough syrup is causing nausea. Pt has one day left of Zpack. She would like for CY to address this message when he returns to the office this afternoon. CY - please advise. Thanks.  Allergies  Allergen Reactions  . Chlordiazepoxide-Clidinium Other (See Comments)    Sleepy,weak  . Biaxin [Clarithromycin]     Foul taste, abd pain, diarrhea  . Tramadol   . Codeine     REACTION: gi upset- only in high doses per pt.  Can tolerate cough syrup with codeine.    Current Outpatient Prescriptions on File Prior to Visit  Medication Sig Dispense Refill  . acetaminophen (TYLENOL) 500 MG tablet Take 500 mg by mouth every 6 (six) hours as needed for pain.     Marland Kitchen ALPRAZolam (XANAX) 0.5 MG tablet take 1/2 to 1 tablet by mouth twice a day 60 tablet 5  . aspirin 325 MG tablet Take 325 mg by mouth daily.      Marland Kitchen azithromycin (ZITHROMAX) 250 MG tablet Take 2 tablets (500 mg) on  Day 1,  followed by 1 tablet (250 mg) once daily on Days 2 through 5. 6 each 0  . B-D UF III MINI PEN NEEDLES 31G X 5 MM MISC USE 5 PER DAY TO INJECT INSULIN  0  . B-D ULTRAFINE III SHORT PEN 31G X 8 MM MISC TEST 3 TIMES A DAY 100 each 5  . bacitracin ophthalmic ointment Reported on 09/06/2015  0  . benzonatate (TESSALON) 200 MG capsule take 1 capsule by mouth three times a day if needed for cough 30 capsule 5  . cefdinir (OMNICEF) 300 MG capsule Take 1 capsule (300 mg total) by mouth 2 (two) times daily. (Patient not taking: Reported on 03/21/2016) 14 capsule 0  . celecoxib (CELEBREX) 100 MG capsule Take 1 capsule (100 mg total) by mouth 2 (two) times daily. 60 capsule 2  . celecoxib (CELEBREX) 100 MG capsule take 1 capsule by mouth twice a day (Patient not taking: Reported on  03/21/2016) 60 capsule 3  . cholestyramine (QUESTRAN) 4 g packet take 1 packet once daily if needed for diarrhea 60 packet 1  . ciprofloxacin (CIPRO) 500 MG tablet Take 1 tablet (500 mg total) by mouth 2 (two) times daily. 10 tablet 0  . dicyclomine (BENTYL) 10 MG capsule take 1 capsule by mouth once daily 30 capsule 5  . fluconazole (DIFLUCAN) 150 MG tablet Take 1 tablet (150 mg total) by mouth once. (Patient not taking: Reported on 03/21/2016) 1 tablet 1  . glucose blood (ACCU-CHEK AVIVA PLUS) test strip Use as instructed to check blood sugars 8 times per day dx code 250.02 250 each 3  . insulin aspart (NOVOLOG FLEXPEN) 100 UNIT/ML FlexPen Inject 3 times daily with meals. 18 units with breakfast 22 units with lunch and 24 units with supper. (Patient not taking: Reported on 03/21/2016) 30 mL 3  . Insulin Degludec (TRESIBA FLEXTOUCH) 200 UNIT/ML SOPN Inject 94 Units into the skin daily. (Patient taking differently: Inject 50 Units into the skin daily. ) 6 pen 3  . insulin regular human CONCENTRATED (HUMULIN R U-500 KWIKPEN) 500 UNIT/ML kwikpen Inject 50 units three times daily (Patient taking differently: Inject 40 units in the am 45 units at lunch and 30 units at  dinner) 6 mL 3  . Insulin Syringe-Needle U-100 (BD INSULIN SYRINGE ULTRAFINE) 31G X 15/64" 0.5 ML MISC Use 2 per day dx code E11.65 100 each 5  . magic mouthwash SOLN Swish with 2 teaspoonfuls by mouth as needed 240 mL 3  . meclizine (ANTIVERT) 25 MG tablet take 1 tablet by mouth three times a day if needed for dizziness 90 tablet 3  . methocarbamol (ROBAXIN) 500 MG tablet take 1 tablet by mouth three times a day if needed for 10 days 30 tablet 3  . metoprolol succinate (TOPROL-XL) 25 MG 24 hr tablet take 1 tablet by mouth once daily 30 tablet 5  . nystatin (MYCOSTATIN) 100000 UNIT/ML suspension     . omeprazole (PRILOSEC) 20 MG capsule Take 1 capsule (20 mg total) by mouth daily. 90 capsule 2  . OXYGEN 3-3.5 liters O2 pulsed    .  pravastatin (PRAVACHOL) 20 MG tablet take 1 tablet by mouth once daily 30 tablet 5  . predniSONE (DELTASONE) 10 MG tablet Take 1 tablet (10 mg total) by mouth daily with breakfast. (Patient not taking: Reported on 03/21/2016) 5 tablet 0  . predniSONE (DELTASONE) 5 MG tablet 1-3 tabs daily only if needed for cough 30 tablet 0  . Tiotropium Bromide-Olodaterol (STIOLTO RESPIMAT) 2.5-2.5 MCG/ACT AERS Inhale 2 puffs into the lungs daily. 1 Inhaler 11  . trimethoprim (TRIMPEX) 100 MG tablet Reported on 09/06/2015  1  . valsartan (DIOVAN) 160 MG tablet take 1 tablet by mouth once daily 30 tablet 5  . VIGAMOX 0.5 % ophthalmic solution Reported on 09/06/2015    . XOPENEX HFA 45 MCG/ACT inhaler inhale 2 puffs INTO THE LUNGS every 6 hours if needed for wheezing or shortness of breath 15 Inhaler 11   No current facility-administered medications on file prior to visit.

## 2016-05-30 NOTE — Telephone Encounter (Signed)
Spoke with pt, aware of recs.  rx sent to preferred pharmacy.  Nothing further needed.  

## 2016-06-02 NOTE — Telephone Encounter (Signed)
We can see her sooner or refer to Psychiatrist

## 2016-06-02 NOTE — Telephone Encounter (Signed)
Patient state that she think her nerve causes her b/s to be so high.   She have other concerns to talk about as well.

## 2016-06-05 DIAGNOSIS — M47816 Spondylosis without myelopathy or radiculopathy, lumbar region: Secondary | ICD-10-CM | POA: Diagnosis not present

## 2016-06-08 NOTE — Telephone Encounter (Signed)
Please ask her if she is having any problems now, if she is having excessive anxiety or depression she can see a psychiatrist

## 2016-06-08 NOTE — Telephone Encounter (Signed)
Going to have radio frequency on her back and she is being kicked out of her apartment and she stated she is not crazy and does not want to see a psychiatrist she is just stressed and thinks that is what is causing her blood sugar to be elevated please advise

## 2016-06-12 NOTE — Telephone Encounter (Signed)
If her blood sugars are consistently around 200 although she can increase all her insulin doses by 5 units

## 2016-06-13 NOTE — Telephone Encounter (Signed)
Unable to speak with patient of medication increase. Will need to have someone call her back later.

## 2016-06-16 ENCOUNTER — Telehealth: Payer: Self-pay | Admitting: Internal Medicine

## 2016-06-16 MED ORDER — BENZONATATE 200 MG PO CAPS: ORAL_CAPSULE | ORAL | 3 refills | Status: DC

## 2016-06-16 MED ORDER — AZITHROMYCIN 250 MG PO TABS: ORAL_TABLET | ORAL | 0 refills | Status: AC

## 2016-06-16 NOTE — Telephone Encounter (Signed)
patient given instructions and she stated an understanding-

## 2016-06-16 NOTE — Telephone Encounter (Signed)
Spoke with pt. She is aware of CY's recommendation. Rxs have been sent in. Nothing further was needed.

## 2016-06-16 NOTE — Telephone Encounter (Signed)
Please let her know about her insulin dose

## 2016-06-16 NOTE — Telephone Encounter (Signed)
Offer Zpak 250 mg, # 6, 2 today then one daily  Refill tessalon perles with 3 refills

## 2016-06-16 NOTE — Telephone Encounter (Signed)
Spoke with pt. States that she has bronchitis. Reports cough and wheezing. Cough is producing green mucus. Denies chest tightness, SOB or fever/chills/sweats. Has been using Tessalon Perles to help with cough but she has since run out. Pt would like CY's recommendations and a refill on Tessalon.  CY - please advise. Thanks.  Allergies  Allergen Reactions  . Chlordiazepoxide-Clidinium Other (See Comments)    Sleepy,weak  . Biaxin [Clarithromycin]     Foul taste, abd pain, diarrhea  . Tramadol   . Codeine     REACTION: gi upset- only in high doses per pt.  Can tolerate cough syrup with codeine.    Current Outpatient Prescriptions on File Prior to Visit  Medication Sig Dispense Refill  . acetaminophen (TYLENOL) 500 MG tablet Take 500 mg by mouth every 6 (six) hours as needed for pain.     Marland Kitchen ALPRAZolam (XANAX) 0.5 MG tablet take 1/2 to 1 tablet by mouth twice a day 60 tablet 5  . aspirin 325 MG tablet Take 325 mg by mouth daily.      . B-D UF III MINI PEN NEEDLES 31G X 5 MM MISC USE 5 PER DAY TO INJECT INSULIN  0  . B-D ULTRAFINE III SHORT PEN 31G X 8 MM MISC TEST 3 TIMES A DAY 100 each 5  . bacitracin ophthalmic ointment Reported on 09/06/2015  0  . benzonatate (TESSALON) 200 MG capsule take 1 capsule by mouth three times a day if needed for cough 30 capsule 5  . cefdinir (OMNICEF) 300 MG capsule Take 1 capsule (300 mg total) by mouth 2 (two) times daily. (Patient not taking: Reported on 03/21/2016) 14 capsule 0  . celecoxib (CELEBREX) 100 MG capsule Take 1 capsule (100 mg total) by mouth 2 (two) times daily. 60 capsule 2  . celecoxib (CELEBREX) 100 MG capsule take 1 capsule by mouth twice a day (Patient not taking: Reported on 03/21/2016) 60 capsule 3  . cholestyramine (QUESTRAN) 4 g packet take 1 packet once daily if needed for diarrhea 60 packet 1  . dicyclomine (BENTYL) 10 MG capsule take 1 capsule by mouth once daily 30 capsule 5  . fluconazole (DIFLUCAN) 150 MG tablet Take 1 tablet  (150 mg total) by mouth once. (Patient not taking: Reported on 03/21/2016) 1 tablet 1  . glucose blood (ACCU-CHEK AVIVA PLUS) test strip Use as instructed to check blood sugars 8 times per day dx code 250.02 250 each 3  . insulin aspart (NOVOLOG FLEXPEN) 100 UNIT/ML FlexPen Inject 3 times daily with meals. 18 units with breakfast 22 units with lunch and 24 units with supper. (Patient not taking: Reported on 03/21/2016) 30 mL 3  . Insulin Degludec (TRESIBA FLEXTOUCH) 200 UNIT/ML SOPN Inject 94 Units into the skin daily. (Patient taking differently: Inject 50 Units into the skin daily. ) 6 pen 3  . insulin regular human CONCENTRATED (HUMULIN R U-500 KWIKPEN) 500 UNIT/ML kwikpen Inject 50 units three times daily (Patient taking differently: Inject 40 units in the am 45 units at lunch and 30 units at dinner) 6 mL 3  . Insulin Syringe-Needle U-100 (BD INSULIN SYRINGE ULTRAFINE) 31G X 15/64" 0.5 ML MISC Use 2 per day dx code E11.65 100 each 5  . magic mouthwash SOLN Swish with 2 teaspoonfuls by mouth as needed 240 mL 3  . meclizine (ANTIVERT) 25 MG tablet take 1 tablet by mouth three times a day if needed for dizziness 90 tablet 3  . methocarbamol (ROBAXIN) 500 MG tablet take  1 tablet by mouth three times a day if needed for 10 days 30 tablet 3  . metoprolol succinate (TOPROL-XL) 25 MG 24 hr tablet take 1 tablet by mouth once daily 30 tablet 5  . nystatin (MYCOSTATIN) 100000 UNIT/ML suspension     . omeprazole (PRILOSEC) 20 MG capsule Take 1 capsule (20 mg total) by mouth daily. 90 capsule 2  . OXYGEN 3-3.5 liters O2 pulsed    . pravastatin (PRAVACHOL) 20 MG tablet take 1 tablet by mouth once daily 30 tablet 5  . predniSONE (DELTASONE) 10 MG tablet Take 1 tablet (10 mg total) by mouth daily with breakfast. 6 tablet 0  . Tiotropium Bromide-Olodaterol (STIOLTO RESPIMAT) 2.5-2.5 MCG/ACT AERS Inhale 2 puffs into the lungs daily. 1 Inhaler 11  . trimethoprim (TRIMPEX) 100 MG tablet Reported on 09/06/2015  1  .  valsartan (DIOVAN) 160 MG tablet take 1 tablet by mouth once daily 30 tablet 5  . VIGAMOX 0.5 % ophthalmic solution Reported on 09/06/2015    . XOPENEX HFA 45 MCG/ACT inhaler inhale 2 puffs INTO THE LUNGS every 6 hours if needed for wheezing or shortness of breath 15 Inhaler 11   No current facility-administered medications on file prior to visit.

## 2016-06-19 ENCOUNTER — Other Ambulatory Visit: Payer: Self-pay | Admitting: Internal Medicine

## 2016-06-21 ENCOUNTER — Encounter: Payer: Self-pay | Admitting: Endocrinology

## 2016-06-21 ENCOUNTER — Ambulatory Visit (INDEPENDENT_AMBULATORY_CARE_PROVIDER_SITE_OTHER): Payer: Medicare Other | Admitting: Endocrinology

## 2016-06-21 VITALS — BP 140/72 | HR 96 | Ht 63.0 in | Wt 200.0 lb

## 2016-06-21 DIAGNOSIS — R059 Cough, unspecified: Secondary | ICD-10-CM

## 2016-06-21 DIAGNOSIS — Z794 Long term (current) use of insulin: Secondary | ICD-10-CM | POA: Diagnosis not present

## 2016-06-21 DIAGNOSIS — J40 Bronchitis, not specified as acute or chronic: Secondary | ICD-10-CM | POA: Diagnosis not present

## 2016-06-21 DIAGNOSIS — E782 Mixed hyperlipidemia: Secondary | ICD-10-CM

## 2016-06-21 DIAGNOSIS — R05 Cough: Secondary | ICD-10-CM

## 2016-06-21 DIAGNOSIS — E1165 Type 2 diabetes mellitus with hyperglycemia: Secondary | ICD-10-CM | POA: Diagnosis not present

## 2016-06-21 DIAGNOSIS — I10 Essential (primary) hypertension: Secondary | ICD-10-CM

## 2016-06-21 LAB — LIPID PANEL
CHOL/HDL RATIO: 3
Cholesterol: 142 mg/dL (ref 0–200)
HDL: 46.9 mg/dL (ref >39.00)
LDL CALC: 59 mg/dL (ref 0–99)
NONHDL: 95.17
Triglycerides: 179 mg/dL — ABNORMAL HIGH (ref 0.0–149.0)
VLDL: 35.8 mg/dL (ref 0.0–40.0)

## 2016-06-21 LAB — COMPREHENSIVE METABOLIC PANEL
ALT: 18 U/L (ref 0–35)
AST: 34 U/L (ref 0–37)
Albumin: 4 g/dL (ref 3.5–5.2)
Alkaline Phosphatase: 39 U/L (ref 39–117)
BILIRUBIN TOTAL: 0.3 mg/dL (ref 0.2–1.2)
BUN: 15 mg/dL (ref 6–23)
CHLORIDE: 96 meq/L (ref 96–112)
CO2: 34 meq/L — AB (ref 19–32)
CREATININE: 0.69 mg/dL (ref 0.40–1.20)
Calcium: 9.5 mg/dL (ref 8.4–10.5)
GFR: 87.23 mL/min (ref >60.00)
GLUCOSE: 187 mg/dL — AB (ref 70–99)
Potassium: 4.4 mEq/L (ref 3.5–5.1)
Sodium: 134 mEq/L — ABNORMAL LOW (ref 135–145)
Total Protein: 7.2 g/dL (ref 6.0–8.3)

## 2016-06-21 LAB — POCT GLYCOSYLATED HEMOGLOBIN (HGB A1C): Hemoglobin A1C: 9.6

## 2016-06-21 MED ORDER — PREDNISONE 10 MG PO TABS: 10.0000 mg | ORAL_TABLET | Freq: Every day | ORAL | 1 refills | Status: DC

## 2016-06-21 NOTE — Progress Notes (Signed)
Patient ID: [name], female   DOB: 1935/06/07, 81 y.o.   MRN: [id number]   Reason for Appointment: Diabetes and follow-up of multiple problems  History of Present Illness    PROBLEM 1:  DIABETES type II since 1980   She has been on insulin using Lantus and Humalog for several years and usually A1c around 8% despite fairly good compliance with diet, exercise and glucose monitoring. She also had been on Actos previously which probably benefited her especially with her fatty liver but she stopped it because of fear of side effects      Recent history:   Insulin regimen: Tresiba  70 acb   NOVOLOG before meals 20-24-30   Her A1c has been consistently around 9- 10% and now 9.6   She was changed from NovoLog to U-500 insulin starting in 02/2016; she did not related was controlling her sugars and preferred to go back to NovoLog.  With going back to NovoLog her TRESIBA dose was increased by 20 units  HIGHEST blood sugars appear to be overall midday and afternoon  However she has marked variability in her blood sugars later in the day especially after supper  Some of her high readings are related to stress, pain and she thinks that sugars were higher when she had bronchitis also  FASTING blood sugars are now running mostly high and she also has relatively high readings through the night recently  She is afraid of hypoglycemia because of living alone  She has some blood sugars near normal before suppertime and bedtime in the last 10 days  She thinks she is still eating small portions  Oral hypoglycemic drugs: none       Side effects from medications: None         Proper timing of medications in relation to meals: Yes.          Monitors blood glucose:  5 times a day   Glucometer:  Accu-Chek         Blood Glucose averages recently from meter download:  Mean values apply above for all meters except median for One Touch  PRE-MEAL Fasting Lunch Dinner Bedtime Overall   Glucose range: 139-244    1 31-320    Mean/median: 182 211 206  205 202 +/-52    POST-MEAL PC Breakfast PC Lunch PC Dinner  Glucose range:     Mean/median: 221 220     Overnight Average 193  Meals: 3 meals per day.  Smaller portions; lunch 1-2 pm Supper 6 to 7 PM     Physical activity: exercise: Unable to do any because of dyspnea and fatigue            Dietician visit: Most recent: Several years ago           Complications: are: Peripheral neuropathy with sensory loss     Wt Readings from Last 3 Encounters:  06/21/16 200 lb (90.7 kg)  03/21/16 200 lb (90.7 kg)  02/15/16 196 lb 6.4 oz (89.1 kg)    Lab Results  Component Value Date   HGBA1C 10.1 02/15/2016   HGBA1C 10.1 02/15/2016   HGBA1C 10.5 (H) 11/15/2015   Lab Results  Component Value Date   MICROALBUR 6.9 (H) 10/14/2014   LDLCALC 84 11/15/2015   CREATININE 0.77 03/21/2016     OTHER Current problems  are detailed in review of systems   Allergies as of 06/21/2016      Reactions   Chlordiazepoxide-clidinium Other (See Comments)  Sleepy,weak   Biaxin [clarithromycin]    Foul taste, abd pain, diarrhea   Tramadol    Codeine    REACTION: gi upset- only in high doses per pt.  Can tolerate cough syrup with codeine.       Medication List       Accurate as of 06/21/16 12:52 PM. Always use your most recent med list.          acetaminophen 500 MG tablet Commonly known as:  TYLENOL Take 500 mg by mouth every 6 (six) hours as needed for pain.   ALPRAZolam 0.5 MG tablet Commonly known as:  XANAX take 1/2 to 1 tablet by mouth twice a day   aspirin 325 MG tablet Take 325 mg by mouth daily.   azithromycin 250 MG tablet Commonly known as:  ZITHROMAX Z-PAK Take 2 tablets (500 mg) on  Day 1,  followed by 1 tablet (250 mg) once daily on Days 2 through 5.   B-D UF III MINI PEN NEEDLES 31G X 5 MM Misc Generic drug:  Insulin Pen Needle USE 5 PER DAY TO INJECT INSULIN   B-D ULTRAFINE III SHORT PEN 31G X 8 MM  Misc Generic drug:  Insulin Pen Needle TEST 3 TIMES A DAY   bacitracin ophthalmic ointment Reported on 09/06/2015   benzonatate 200 MG capsule Commonly known as:  TESSALON take 1 capsule by mouth three times a day if needed for cough   cefdinir 300 MG capsule Commonly known as:  OMNICEF Take 1 capsule (300 mg total) by mouth 2 (two) times daily.   celecoxib 100 MG capsule Commonly known as:  CELEBREX Take 1 capsule (100 mg total) by mouth 2 (two) times daily.   celecoxib 100 MG capsule Commonly known as:  CELEBREX take 1 capsule by mouth twice a day   cholestyramine 4 g packet Commonly known as:  QUESTRAN take 1 packet once daily if needed for diarrhea   dicyclomine 10 MG capsule Commonly known as:  BENTYL take 1 capsule by mouth once daily   fluconazole 150 MG tablet Commonly known as:  DIFLUCAN Take 1 tablet (150 mg total) by mouth once.   glucose blood test strip Commonly known as:  ACCU-CHEK AVIVA PLUS Use as instructed to check blood sugars 8 times per day dx code 250.02   insulin aspart 100 UNIT/ML FlexPen Commonly known as:  NOVOLOG FLEXPEN Inject 3 times daily with meals. 18 units with breakfast 22 units with lunch and 24 units with supper.   Insulin Degludec 200 UNIT/ML Sopn Commonly known as:  TRESIBA FLEXTOUCH Inject 94 Units into the skin daily.   insulin regular human CONCENTRATED 500 UNIT/ML kwikpen Commonly known as:  HUMULIN R U-500 KWIKPEN Inject 50 units three times daily   Insulin Syringe-Needle U-100 31G X 15/64" 0.5 ML Misc Commonly known as:  BD INSULIN SYRINGE ULTRAFINE Use 2 per day dx code E11.65   magic mouthwash Soln Swish with 2 teaspoonfuls by mouth as needed   meclizine 25 MG tablet Commonly known as:  ANTIVERT take 1 tablet by mouth three times a day if needed for dizziness   methocarbamol 500 MG tablet Commonly known as:  ROBAXIN take 1 tablet by mouth three times a day if needed for 10 days   metoprolol succinate 25 MG  24 hr tablet Commonly known as:  TOPROL-XL take 1 tablet by mouth once daily   nystatin 100000 UNIT/ML suspension Commonly known as:  MYCOSTATIN   omeprazole 20 MG capsule Commonly  known as:  PRILOSEC Take 1 capsule (20 mg total) by mouth daily.   OXYGEN 3-3.5 liters O2 pulsed   pravastatin 20 MG tablet Commonly known as:  PRAVACHOL take 1 tablet by mouth once daily   predniSONE 10 MG tablet Commonly known as:  DELTASONE Take 1 tablet (10 mg total) by mouth daily with breakfast.   STIOLTO RESPIMAT 2.5-2.5 MCG/ACT Aers Generic drug:  Tiotropium Bromide-Olodaterol inhale 2 puffs once daily   trimethoprim 100 MG tablet Commonly known as:  TRIMPEX Reported on 09/06/2015   valsartan 160 MG tablet Commonly known as:  DIOVAN take 1 tablet by mouth once daily   VIGAMOX 0.5 % ophthalmic solution Generic drug:  moxifloxacin Reported on 09/06/2015   XOPENEX HFA 45 MCG/ACT inhaler Generic drug:  levalbuterol inhale 2 puffs INTO THE LUNGS every 6 hours if needed for wheezing or shortness of breath       Allergies:  Allergies  Allergen Reactions  . Chlordiazepoxide-Clidinium Other (See Comments)    Sleepy,weak  . Biaxin [Clarithromycin]     Foul taste, abd pain, diarrhea  . Tramadol   . Codeine     REACTION: gi upset- only in high doses per pt.  Can tolerate cough syrup with codeine.     Past Medical History:  Diagnosis Date  . Allergic rhinitis, cause unspecified    Sinus CT Rec 12-23-2009  . Arthritis   . Benign paroxysmal positional vertigo 08/09/2012  . 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 Complication of anesthesia    takes along time to wake up  . Diabetes mellitus   . Diarrhea   . Diverticulosis   . Esophageal reflux   . Esophageal stricture   . GERD (gastroesophageal reflux disease)   . Glaucoma   . Hiatal hernia   . Hypertension   . Irritable bowel syndrome   . Neuropathy in diabetes ([place])  08/09/2012  . Other chronic nonalcoholic liver disease   . Oxygen deficiency   . Sciatica   . Shingles 2010  . Shortness of breath   . Steatohepatitis     Past Surgical History:  Procedure Laterality Date  . APPENDECTOMY    . CARPAL TUNNEL RELEASE     right hand  . CATARACT EXTRACTION     Bilateral  . CHOLECYSTECTOMY OPEN  1978  . COLONOSCOPY  07-2001   mild diverticulosis  . ESOPHAGOGASTRODUODENOSCOPY  5621,30-86   H Hernia,es.stricture s/p dil 57F  . EYE SURGERY    . LIVER BIOPSY  08-1992  . PARATHYROID EXPLORATION    . TONSILLECTOMY AND ADENOIDECTOMY    . TOTAL ABDOMINAL HYSTERECTOMY    . ULNAR NERVE TRANSPOSITION  12/07/2011   Procedure: ULNAR NERVE DECOMPRESSION/TRANSPOSITION;this was cancelled-not done  Surgeon: Cammie Sickle., MD;  Location: [facility];  Service: Orthopedics;  Laterality: Right;  right ulnar nerve in situ decompression  . ULNAR TUNNEL RELEASE  03/07/2012   Procedure: CUBITAL TUNNEL RELEASE;  Surgeon: Roseanne Kaufman, MD;  Location: [facility];  Service: Orthopedics;  Laterality: Right;  ulnar nerve release at the elbow      Family History  Problem Relation Age of Onset  . Lung cancer Mother     small cell;Byssinosis  . Heart disease Father   . Lung cancer Sister   . Liver cancer Sister     ? mets from another area of the body  . Diabetes      grandmother  .  Stroke Maternal Grandfather     Social History:  reports that she quit smoking about 28 years ago. Her smoking use included Cigarettes. She has never used smokeless tobacco. She reports that she does not drink alcohol or use drugs.  Review of Systems:  She had called Her pulmonologist about a respiratory infection and was given Zithromax on 2/9 However she continues to have significant cough, apparently she does get better with prednisone   LOW back pain, left hip pain:  She has been to various orthopedic surgeons and now is planning to have a  radiofrequency ablation procedure done She is having a lot of pain and is not able to stand for long  HYPERTENSION:   blood pressure is well controlled and consistent  She is compliant with half tablet of 160 mg Diovan  BP Readings from Last 3 Encounters:  06/21/16 140/72  03/21/16 130/70  02/15/16 138/82     She takes Xanax at night mostly and lorazepam in the daytime for anxiety  HYPERLIPIDEMIA:  The lipid abnormality consists of elevated LDL treated with pravastatin 20 mg  which she has no side effects from, Taking it regularly    Her last LDL is  below 100 She does need a repeat lipid panel today   Lab Results  Component Value Date   CHOL 154 11/15/2015   HDL 39.60 11/15/2015   LDLCALC 84 11/15/2015   LDLDIRECT 89.0 10/14/2014   TRIG 151.0 (H) 11/15/2015   CHOLHDL 4 11/15/2015    She is followed by pulmonologist for chronic COPD, taking home oxygen   Dizziness- has occasional dizzy spells of unclear etiology, Tends to feel off balance Recently not symptomatic   Diarrhea: She has had long-standing intermittent diarrhea possibly IBS. Is taking Questran as needed  Occasionally has constipation also  NEUROPATHY: She has numbness in her feet.  Also has tingling and numbness in her hands Foot exam done in 10/17: Foot exam shows sensory loss distally, normal pulses and no skin lesions Does not like pain medications   Examination:   BP 140/72   Pulse 96   Ht 5' 3"  (1.6 m)   Wt 200 lb (90.7 kg)   SpO2 90%   BMI 35.43 kg/m   Body mass index is 35.43 kg/m.    Lungs clear  ASSESSMENT/ PLAN:    Diabetes type 2 with obesity See history of present illness for detailed discussion of  current management, blood sugar patterns and problems identified  As before her A1c is still high and now 9.6 More recently has relatively higher readings fasting and before lunch compared torest of the day, overall average is still about 200 as before She does take extra 4 units when  blood sugars are over 200 but is getting high readings mostly in the morning even in the last few days  Discussed adjusting her basal insulin to keep fasting and overnight blood sugars better control Go up 5 units for now on the Tresiba Also increase morning NovoLog by 4 units and take 24 also at this time She would take extra 6 units when blood sugars are high was starting prednisone  HYPERTENSION:  Fairly good control  Needs follow-up urine microalbumin  BRONCHITIS: Improving but she is still having cough and she thinks this is usually responding to prednisone, will have her take prednisone 10 mg daily for the next 5-7 days  LIPIDS: Pending  Patient Instructions  Tresiba 76 daily  With Prednisone add 6 to novolog if > 200  Take 24 in am as base dose   Total visit time for reviewing multiple problems and counseling as well as Labs = 25 minutes  Nicklas Mcsweeney 06/21/2016, 12:52 PM

## 2016-06-21 NOTE — Addendum Note (Signed)
Addended by: Nile Riggs on: 06/21/2016 02:05 PM   Modules accepted: Orders

## 2016-06-21 NOTE — Patient Instructions (Signed)
[name] 76 daily  With Prednisone add 6 to novolog if > 200  Take 24 in am as base dose

## 2016-06-22 ENCOUNTER — Other Ambulatory Visit: Payer: Self-pay | Admitting: Endocrinology

## 2016-06-23 ENCOUNTER — Other Ambulatory Visit: Payer: Self-pay

## 2016-06-23 ENCOUNTER — Telehealth: Payer: Self-pay | Admitting: Internal Medicine

## 2016-06-23 NOTE — Telephone Encounter (Signed)
Spoke with pt  She is wanting to switch from [facility] to [facility] since they get a new POC  She is aware she will need to recertify and will need ov  She has appt already on 07/25/16  She is ok with waiting until then  I will add to appt notes  Nothing further needed

## 2016-07-03 ENCOUNTER — Other Ambulatory Visit: Payer: Self-pay

## 2016-07-03 MED ORDER — INSULIN ASPART 100 UNIT/ML FLEXPEN: PEN_INJECTOR | SUBCUTANEOUS | 4 refills | Status: DC

## 2016-07-15 ENCOUNTER — Other Ambulatory Visit: Payer: Self-pay | Admitting: Endocrinology

## 2016-07-17 ENCOUNTER — Telehealth: Payer: Self-pay | Admitting: Internal Medicine

## 2016-07-17 MED ORDER — PSEUDOEPH-BROMPHEN-DM 30-2-10 MG/5ML PO SYRP: 5.0000 mL | ORAL_SOLUTION | Freq: Four times a day (QID) | ORAL | 0 refills | Status: DC | PRN

## 2016-07-17 NOTE — Telephone Encounter (Signed)
Spoke with pt. She is aware of CY's response. Rx has been sent in. Nothing further was needed. 

## 2016-07-17 NOTE — Telephone Encounter (Signed)
Offer Bromfed DM  140 ml    5-10 ml every 6 hours if needed for cough

## 2016-07-17 NOTE — Telephone Encounter (Signed)
Called and spoke to pt. Pt states she cannot afford Tessalon ($30) and is requesting something cheaper. Tessalon was given on 2.9.18. The pharmacist states she thinks Bromfed would be cheaper for pt. Pt is requesting a cheaper alternative.   Dr. Annamaria Boots please advise. Thanks.   Allergies  Allergen Reactions  . Chlordiazepoxide-Clidinium Other (See Comments)    Sleepy,weak  . Biaxin [Clarithromycin]     Foul taste, abd pain, diarrhea  . Tramadol   . Codeine     REACTION: gi upset- only in high doses per pt.  Can tolerate cough syrup with codeine.     Current Outpatient Prescriptions on File Prior to Visit  Medication Sig Dispense Refill  . acetaminophen (TYLENOL) 500 MG tablet Take 500 mg by mouth every 6 (six) hours as needed for pain.     Marland Kitchen ALPRAZolam (XANAX) 0.5 MG tablet take 1/2 to 1 tablet by mouth twice a day 60 tablet 5  . aspirin 325 MG tablet Take 325 mg by mouth daily.      . B-D UF III MINI PEN NEEDLES 31G X 5 MM MISC USE 5 PER DAY TO INJECT INSULIN  0  . B-D ULTRAFINE III SHORT PEN 31G X 8 MM MISC TEST 3 TIMES A DAY 100 each 5  . bacitracin ophthalmic ointment Reported on 09/06/2015  0  . benzonatate (TESSALON) 200 MG capsule take 1 capsule by mouth three times a day if needed for cough 30 capsule 3  . cefdinir (OMNICEF) 300 MG capsule Take 1 capsule (300 mg total) by mouth 2 (two) times daily. 14 capsule 0  . celecoxib (CELEBREX) 100 MG capsule Take 1 capsule (100 mg total) by mouth 2 (two) times daily. (Patient not taking: Reported on 06/21/2016) 60 capsule 2  . celecoxib (CELEBREX) 100 MG capsule take 1 capsule by mouth twice a day (Patient not taking: Reported on 06/21/2016) 60 capsule 3  . cholestyramine (QUESTRAN) 4 g packet take 1 packet once daily if needed for diarrhea 60 packet 1  . dicyclomine (BENTYL) 10 MG capsule take 1 capsule by mouth once daily 30 capsule 5  . fluconazole (DIFLUCAN) 150 MG tablet Take 1 tablet (150 mg total) by mouth once. 1 tablet 1  . glucose  blood (ACCU-CHEK AVIVA PLUS) test strip Use as instructed to check blood sugars 8 times per day dx code 250.02 250 each 3  . insulin aspart (NOVOLOG FLEXPEN) 100 UNIT/ML FlexPen Inject 30 units at breakfast- 24 units at lunch- and 30 units at supper 10 pen 4  . insulin regular human CONCENTRATED (HUMULIN R U-500 KWIKPEN) 500 UNIT/ML kwikpen Inject 50 units three times daily (Patient taking differently: Inject 40 units in the am 45 units at lunch and 30 units at dinner) 6 mL 3  . Insulin Syringe-Needle U-100 (BD INSULIN SYRINGE ULTRAFINE) 31G X 15/64" 0.5 ML MISC Use 2 per day dx code E11.65 100 each 5  . magic mouthwash SOLN Swish with 2 teaspoonfuls by mouth as needed 240 mL 3  . meclizine (ANTIVERT) 25 MG tablet take 1 tablet by mouth three times a day if needed for dizziness 90 tablet 3  . methocarbamol (ROBAXIN) 500 MG tablet take 1 tablet by mouth three times a day if needed for 10 days 30 tablet 3  . metoprolol succinate (TOPROL-XL) 25 MG 24 hr tablet take 1 tablet by mouth once daily 30 tablet 5  . nystatin (MYCOSTATIN) 100000 UNIT/ML suspension     . omeprazole (PRILOSEC) 20 MG capsule  Take 1 capsule (20 mg total) by mouth daily. 90 capsule 2  . OXYGEN 3-3.5 liters O2 pulsed    . pravastatin (PRAVACHOL) 20 MG tablet take 1 tablet by mouth once daily 30 tablet 5  . predniSONE (DELTASONE) 10 MG tablet Take 1 tablet (10 mg total) by mouth daily with breakfast. 10 tablet 1  . STIOLTO RESPIMAT 2.5-2.5 MCG/ACT AERS inhale 2 puffs once daily 4 g 1  . TRESIBA FLEXTOUCH 200 UNIT/ML SOPN inject 94 units INTO THE SKIN DAILY 18 mL 3  . trimethoprim (TRIMPEX) 100 MG tablet Reported on 09/06/2015  1  . valsartan (DIOVAN) 160 MG tablet take 1 tablet by mouth once daily 30 tablet 5  . VIGAMOX 0.5 % ophthalmic solution Reported on 09/06/2015    . XOPENEX HFA 45 MCG/ACT inhaler inhale 2 puffs INTO THE LUNGS every 6 hours if needed for wheezing or shortness of breath 15 Inhaler 11   No current  facility-administered medications on file prior to visit.

## 2016-07-25 ENCOUNTER — Encounter: Payer: Self-pay | Admitting: Internal Medicine

## 2016-07-25 ENCOUNTER — Ambulatory Visit (INDEPENDENT_AMBULATORY_CARE_PROVIDER_SITE_OTHER): Payer: Medicare Other | Admitting: Internal Medicine

## 2016-07-25 VITALS — BP 126/82 | HR 81 | Ht 63.0 in | Wt 203.6 lb

## 2016-07-25 DIAGNOSIS — J3089 Other allergic rhinitis: Secondary | ICD-10-CM | POA: Diagnosis not present

## 2016-07-25 DIAGNOSIS — J9611 Chronic respiratory failure with hypoxia: Secondary | ICD-10-CM

## 2016-07-25 DIAGNOSIS — J449 Chronic obstructive pulmonary disease, unspecified: Secondary | ICD-10-CM

## 2016-07-25 DIAGNOSIS — J302 Other seasonal allergic rhinitis: Secondary | ICD-10-CM | POA: Diagnosis not present

## 2016-07-25 NOTE — Assessment & Plan Note (Signed)
We discussed potential risk of oversedation with CO2 retention/hypoventilation during a conscious sedation procedure. She will discuss with her back surgeon. Hopefully the procedure will help her progress back pain has impacted her quality of life.

## 2016-07-25 NOTE — Assessment & Plan Note (Signed)
Current meds are appropriate. She is limited by finances. No recent exacerbation.

## 2016-07-25 NOTE — Assessment & Plan Note (Signed)
No significant seasonal exacerbation yet. She understands availability of OTC Claritin and Flonase if needed.

## 2016-07-25 NOTE — Progress Notes (Signed)
Patient ID: [name], female    DOB: November 28, 1935, 81 y.o.   MRN: [id number]  HPI Female former smoker followed for COPD, chronic hypoxic respiratory failure, complicated by chronic rhinitis, GERD, DM  2012- Walk test Patient Saturations on Room Air while Ambulating = 84% Patient Saturations on 2.5 Liters of oxygen while Ambulating = 92%  PFT: 01/16/2012 severe obstructive airways disease with insignificant response to bronchodilator, air trapping, diffusion moderately reduced. FEV1 0.82/45%, FEV1/FEC 0.46. Emphysema pattern on the loop. TLC 100%, RV 148%, DLCO 47%. ---------------------------------------------------------------------  01/13/2016-81 year old female former smoker followed for COPD, chronic hypoxic respiratory failure, complicated by chronic rhinitis, GERD, DM O2 3-3.5 L/ Advanced  FOLLOWS FOR: Pt states her breathing has been horrible-hard time breathing and continues to use O2. DME: [facility].  Pt needs order to [facility] to evaluate for newer O2 tank and light weight. Current device is 3 or 4 months old. Helios portable O2 device is difficult for her to work with. She says she has had a series of old machines which leak or breakdown frequently and have to be replaced. She understood DME to say her insurance would not cover a POC. Describes episodes of "fiery burning" nonexertional discomfort left upper anterior chest wall while sitting quietly watching TV.  07/25/2016- 82 year old female former smoker followed for COPD, chronic hypoxic respiratory failure, complicated by chronic rhinitis, GERD, DM2 O2 3-3 0.5 L/Advanced>    May be changing to a light POC through [facility]: Pt continues to have "bad' coughing spells;pt would like to discuss O2 questions and concerns of it.  Pending nerve block procedure under conscious sedation by her description for neuropathic back pain related to scoliosis. We discussed respiratory safety and precautions. She remains dependent on oxygen.  Her Helios system has always been too heavy to tolerate with her back pain. Back pain probably limits activity more than dyspnea. She asks help replacing Helios with a light portable concentrator available through [facility] No recent acute respiratory change or infection. Dr. Dwyane Dee had given several days of low-dose prednisone earlier this year for bronchitis exacerbation. That seemed to help at the time. CXR 01/13/16 IMPRESSION: No acute cardiopulmonary disease.  ROS-see HPI Constitutional:   No-   weight loss, night sweats, fevers, chills, fatigue, lassitude. HEENT:   No-  headaches, difficulty swallowing, tooth/dental problems, sore throat,       No-  sneezing, itching, ear ache, nasal congestion, post nasal drip,  CV:  No-   chest pain, orthopnea, PND, swelling in lower extremities, anasarca,                                                     dizziness, palpitations Resp: +shortness of breath with exertion or at rest.               productive cough,  + non-productive cough,  No- coughing up of blood.              No-   change in color of mucus.   wheezing.   Skin: No-   rash or lesions. GI:  +heartburn, indigestion, No-abdominal pain, nausea, vomiting,  GU: . MS:  No-   joint pain or swelling.   + back pain Neuro-     nothing unusual Psych:  No- change in mood or affect. +depression or anxiety.  No memory loss.    Objective:  OBJ- Physical Exam    talkative General- Alert, Oriented, Affect-appropriate, Distress- none acute, + overweight, + talkative, O2 Skin- rash-none, lesions- none, excoriation- none Lymphadenopathy- none Head- atraumatic            Eyes- Gross vision intact, PERRLA, conjunctivae and secretions clear            Ears- Hearing, canals-normal            Nose- Clear, no-Septal dev, mucus, polyps, erosion, perforation             Throat- Mallampati IV , mucosa -no thrush , drainage- none, tonsils- atrophic, + hoarse, stridor-none Neck- flexible , trachea midline, no  stridor , thyroid nl, carotid no bruit Chest - symmetrical excursion , unlabored           Heart/CV- RRR , no murmur , no gallop  , no rub, nl s1 s2                           - JVD- none , edema- none, stasis changes- none, varices- none           Lung-  + distant but clear, unlabored, wheeze-none, cough- none while here , dullness-none, rub- none           Chest wall-  Abd-  Br/ Gen/ Rectal- Not done, not indicated Extrem- cyanosis- none, clubbing, none, atrophy- none, strength- nl Neuro- grossly intact to observation

## 2016-07-25 NOTE — Patient Instructions (Signed)
Order- [facility] please evaluate for light portable O2 concentrator    3 L pulse,     Dx COPD mixed type  Ok to continue Stiolto 2 puffs daily and xopenex HFA as rescue inhaler  Take your xopenex inhaler to your procedure tomorrow  To use if needed  Please call as needed

## 2016-07-26 ENCOUNTER — Telehealth: Payer: Self-pay | Admitting: Internal Medicine

## 2016-07-26 DIAGNOSIS — J449 Chronic obstructive pulmonary disease, unspecified: Secondary | ICD-10-CM

## 2016-07-26 DIAGNOSIS — M47816 Spondylosis without myelopathy or radiculopathy, lumbar region: Secondary | ICD-10-CM | POA: Diagnosis not present

## 2016-07-26 DIAGNOSIS — M47817 Spondylosis without myelopathy or radiculopathy, lumbosacral region: Secondary | ICD-10-CM | POA: Diagnosis not present

## 2016-07-26 NOTE — Telephone Encounter (Signed)
Called and spoke to Haslet with [employer]. There is already an order to evaluate pt for POC but she is requesting an order be placed to provide pt with a POC. Order placed. Mandy verbalized understanding and denied any further questions or concerns at this time.

## 2016-07-27 ENCOUNTER — Telehealth: Payer: Self-pay | Admitting: Endocrinology

## 2016-07-27 NOTE — Telephone Encounter (Signed)
Patient ask you to call her, she stated she had surgery yesterday, she also stated it was important for you to call her today.

## 2016-07-27 NOTE — Telephone Encounter (Signed)
Today the pts BS is over 300 after the surgery on her back yesterday, they put steriod in her spine and she needs to know what to do with her insulin please

## 2016-07-27 NOTE — Telephone Encounter (Signed)
If she is taking 76 units of Tresiba she will need to increase this to 90 units until blood sugars below 200.  She can take extra 14 units if she has already taken the morning dose Every time the blood sugars over 300 she will take EXTRA 15 units of Novolog and if it is over 200 take extra 8 units

## 2016-07-27 NOTE — Telephone Encounter (Signed)
Spoke with the patient and gave detailed explanation and she stated an understanding

## 2016-07-31 ENCOUNTER — Other Ambulatory Visit: Payer: Self-pay

## 2016-07-31 ENCOUNTER — Telehealth: Payer: Self-pay | Admitting: Endocrinology

## 2016-07-31 NOTE — Telephone Encounter (Signed)
Spoke with the patient and the pharmacy and this has been resolved

## 2016-07-31 NOTE — Telephone Encounter (Signed)
The pharmacy called in to verify the patient's Novolog script.  The Pt stated that Dr. Dwyane Dee changed her dose to 175 Units daily. And if this is true they need a new prescription sent over.

## 2016-08-01 ENCOUNTER — Other Ambulatory Visit: Payer: Self-pay | Admitting: Internal Medicine

## 2016-08-07 ENCOUNTER — Other Ambulatory Visit: Payer: Self-pay | Admitting: Endocrinology

## 2016-08-14 ENCOUNTER — Other Ambulatory Visit: Payer: Self-pay | Admitting: Internal Medicine

## 2016-08-14 ENCOUNTER — Telehealth: Payer: Self-pay | Admitting: Internal Medicine

## 2016-08-14 NOTE — Telephone Encounter (Signed)
Called and spoke with pt and she is aware of CY recs.  She will wait and see how she feels tomorrow and go from there.

## 2016-08-14 NOTE — Telephone Encounter (Signed)
Called and spoke to pt. Pt c/o sore throat, sinus congestion, increase in SOB, prod cough with clear mucus x 4 days. Pt denies f/c/s and CP/tightness. Pt states she is taking Robitussin and Tessalon prn with little help. Pt is requesting recs.   Dr. Annamaria Boots please advise. Thanks.   Allergies  Allergen Reactions  . Chlordiazepoxide-Clidinium Other (See Comments)    Sleepy,weak  . Biaxin [Clarithromycin]     Foul taste, abd pain, diarrhea  . Tramadol   . Codeine     REACTION: gi upset- only in high doses per pt.  Can tolerate cough syrup with codeine.     Current Outpatient Prescriptions on File Prior to Visit  Medication Sig Dispense Refill  . acetaminophen (TYLENOL) 500 MG tablet Take 500 mg by mouth every 6 (six) hours as needed for pain.     Marland Kitchen ALPRAZolam (XANAX) 0.5 MG tablet take 1/2 to 1 tablet by mouth twice a day 60 tablet 5  . aspirin 325 MG tablet Take 325 mg by mouth daily.      . B-D UF III MINI PEN NEEDLES 31G X 5 MM MISC USE 5 PER DAY TO INJECT INSULIN  0  . B-D ULTRAFINE III SHORT PEN 31G X 8 MM MISC TEST 3 TIMES A DAY 100 each 5  . bacitracin ophthalmic ointment Reported on 09/06/2015  0  . benzonatate (TESSALON) 200 MG capsule take 1 capsule by mouth three times a day if needed for cough 30 capsule 3  . brompheniramine-pseudoephedrine-DM 30-2-10 MG/5ML syrup Take 5-10 mLs by mouth every 6 (six) hours as needed. 140 mL 0  . cefdinir (OMNICEF) 300 MG capsule Take 1 capsule (300 mg total) by mouth 2 (two) times daily. 14 capsule 0  . celecoxib (CELEBREX) 100 MG capsule Take 1 capsule (100 mg total) by mouth 2 (two) times daily. 60 capsule 2  . celecoxib (CELEBREX) 100 MG capsule take 1 capsule by mouth twice a day 60 capsule 3  . cholestyramine (QUESTRAN) 4 g packet take 1 packet once daily if needed for diarrhea 60 packet 1  . dicyclomine (BENTYL) 10 MG capsule take 1 capsule by mouth once daily 30 capsule 5  . fluconazole (DIFLUCAN) 150 MG tablet Take 1 tablet (150 mg total)  by mouth once. 1 tablet 1  . glucose blood (ACCU-CHEK AVIVA PLUS) test strip Use as instructed to check blood sugars 8 times per day dx code 250.02 250 each 3  . insulin aspart (NOVOLOG FLEXPEN) 100 UNIT/ML FlexPen Inject 30 units at breakfast- 24 units at lunch- and 30 units at supper (Patient taking differently: Inject 24 units at breakfast- 24 units at lunch- and 30 units at supper) 10 pen 4  . insulin regular human CONCENTRATED (HUMULIN R U-500 KWIKPEN) 500 UNIT/ML kwikpen Inject 50 units three times daily (Patient taking differently: Inject 40 units in the am 45 units at lunch and 30 units at dinner) 6 mL 3  . Insulin Syringe-Needle U-100 (BD INSULIN SYRINGE ULTRAFINE) 31G X 15/64" 0.5 ML MISC Use 2 per day dx code E11.65 100 each 5  . magic mouthwash SOLN Swish with 2 teaspoonfuls by mouth as needed 240 mL 3  . meclizine (ANTIVERT) 25 MG tablet take 1 tablet by mouth three times a day if needed for dizziness 90 tablet 3  . methocarbamol (ROBAXIN) 500 MG tablet take 1 tablet by mouth three times a day if needed for 10 days 30 tablet 3  . metoprolol succinate (TOPROL-XL) 25 MG 24  hr tablet take 1 tablet by mouth once daily 30 tablet 5  . nystatin (MYCOSTATIN) 100000 UNIT/ML suspension     . omeprazole (PRILOSEC) 20 MG capsule Take 1 capsule (20 mg total) by mouth daily. 90 capsule 2  . OXYGEN 3-3.5 liters O2 pulsed    . pravastatin (PRAVACHOL) 20 MG tablet take 1 tablet by mouth once daily 30 tablet 5  . predniSONE (DELTASONE) 10 MG tablet take 1 tablet by mouth once daily with food 10 tablet 1  . STIOLTO RESPIMAT 2.5-2.5 MCG/ACT AERS inhale 2 puffs once daily 4 g 1  . TRESIBA FLEXTOUCH 200 UNIT/ML SOPN inject 94 units INTO THE SKIN DAILY 18 mL 3  . trimethoprim (TRIMPEX) 100 MG tablet Reported on 09/06/2015  1  . valsartan (DIOVAN) 160 MG tablet take 1 tablet by mouth once daily 30 tablet 5  . VIGAMOX 0.5 % ophthalmic solution Reported on 09/06/2015    . XOPENEX HFA 45 MCG/ACT inhaler inhale 2  puffs INTO THE LUNGS every 6 hours if needed for wheezing or shortness of breath 15 Inhaler 11   No current facility-administered medications on file prior to visit.      Current Outpatient Prescriptions on File Prior to Visit  Medication Sig Dispense Refill  . acetaminophen (TYLENOL) 500 MG tablet Take 500 mg by mouth every 6 (six) hours as needed for pain.     Marland Kitchen ALPRAZolam (XANAX) 0.5 MG tablet take 1/2 to 1 tablet by mouth twice a day 60 tablet 5  . aspirin 325 MG tablet Take 325 mg by mouth daily.      . B-D UF III MINI PEN NEEDLES 31G X 5 MM MISC USE 5 PER DAY TO INJECT INSULIN  0  . B-D ULTRAFINE III SHORT PEN 31G X 8 MM MISC TEST 3 TIMES A DAY 100 each 5  . bacitracin ophthalmic ointment Reported on 09/06/2015  0  . benzonatate (TESSALON) 200 MG capsule take 1 capsule by mouth three times a day if needed for cough 30 capsule 3  . brompheniramine-pseudoephedrine-DM 30-2-10 MG/5ML syrup Take 5-10 mLs by mouth every 6 (six) hours as needed. 140 mL 0  . cefdinir (OMNICEF) 300 MG capsule Take 1 capsule (300 mg total) by mouth 2 (two) times daily. 14 capsule 0  . celecoxib (CELEBREX) 100 MG capsule Take 1 capsule (100 mg total) by mouth 2 (two) times daily. 60 capsule 2  . celecoxib (CELEBREX) 100 MG capsule take 1 capsule by mouth twice a day 60 capsule 3  . cholestyramine (QUESTRAN) 4 g packet take 1 packet once daily if needed for diarrhea 60 packet 1  . dicyclomine (BENTYL) 10 MG capsule take 1 capsule by mouth once daily 30 capsule 5  . fluconazole (DIFLUCAN) 150 MG tablet Take 1 tablet (150 mg total) by mouth once. 1 tablet 1  . glucose blood (ACCU-CHEK AVIVA PLUS) test strip Use as instructed to check blood sugars 8 times per day dx code 250.02 250 each 3  . insulin aspart (NOVOLOG FLEXPEN) 100 UNIT/ML FlexPen Inject 30 units at breakfast- 24 units at lunch- and 30 units at supper (Patient taking differently: Inject 24 units at breakfast- 24 units at lunch- and 30 units at supper) 10 pen  4  . insulin regular human CONCENTRATED (HUMULIN R U-500 KWIKPEN) 500 UNIT/ML kwikpen Inject 50 units three times daily (Patient taking differently: Inject 40 units in the am 45 units at lunch and 30 units at dinner) 6 mL 3  . Insulin  Syringe-Needle U-100 (BD INSULIN SYRINGE ULTRAFINE) 31G X 15/64" 0.5 ML MISC Use 2 per day dx code E11.65 100 each 5  . magic mouthwash SOLN Swish with 2 teaspoonfuls by mouth as needed 240 mL 3  . meclizine (ANTIVERT) 25 MG tablet take 1 tablet by mouth three times a day if needed for dizziness 90 tablet 3  . methocarbamol (ROBAXIN) 500 MG tablet take 1 tablet by mouth three times a day if needed for 10 days 30 tablet 3  . metoprolol succinate (TOPROL-XL) 25 MG 24 hr tablet take 1 tablet by mouth once daily 30 tablet 5  . nystatin (MYCOSTATIN) 100000 UNIT/ML suspension     . omeprazole (PRILOSEC) 20 MG capsule Take 1 capsule (20 mg total) by mouth daily. 90 capsule 2  . OXYGEN 3-3.5 liters O2 pulsed    . pravastatin (PRAVACHOL) 20 MG tablet take 1 tablet by mouth once daily 30 tablet 5  . predniSONE (DELTASONE) 10 MG tablet take 1 tablet by mouth once daily with food 10 tablet 1  . STIOLTO RESPIMAT 2.5-2.5 MCG/ACT AERS inhale 2 puffs once daily 4 g 1  . TRESIBA FLEXTOUCH 200 UNIT/ML SOPN inject 94 units INTO THE SKIN DAILY 18 mL 3  . trimethoprim (TRIMPEX) 100 MG tablet Reported on 09/06/2015  1  . valsartan (DIOVAN) 160 MG tablet take 1 tablet by mouth once daily 30 tablet 5  . VIGAMOX 0.5 % ophthalmic solution Reported on 09/06/2015    . XOPENEX HFA 45 MCG/ACT inhaler inhale 2 puffs INTO THE LUNGS every 6 hours if needed for wheezing or shortness of breath 15 Inhaler 11   No current facility-administered medications on file prior to visit.

## 2016-08-14 NOTE — Telephone Encounter (Signed)
This is likely viral. Antibiotics won't help. Mostly will need to continue what she is doing and wait it out.  Ok to offer prednisone 5 mg, # 7, 1 daily. This is a compromise to try helping breathing some without raising blood sugar too much.

## 2016-08-14 NOTE — Telephone Encounter (Signed)
Ok to wait on the prednisone to see how she does.  I don't know how the back treatment would have affected her upper airway unless they had put her to sleep with a tube down her airway.  If this is mostly like a cold that is just dragging along, then otc symptom treatments like Tylenol Cold and Flu or Mucinex-DM might help until this has time to get better.

## 2016-08-14 NOTE — Telephone Encounter (Signed)
Pt aware of rec's per CY. Pt states that she has been taking Pred 73m daily given by Dr KDwyane Deed/t cough and congestion Pt completed Prednisone 113mabout 1 week ago for same symptoms.  Pt states that she has not improved at all since taking this. Pt not sure if taking more prednisone will help.  Pt states that she had her back treatment 07/26/16, not sure if this has had any affect on her head causing her current symptoms.Symptoms did nto start until after this treatment.  Radiofrequency Therapy - pinpointing areas of pain in nerves and blocking pain sensory to nerves.  Please advise Dr YoAnnamaria BootsThanks.

## 2016-08-15 ENCOUNTER — Telehealth: Payer: Self-pay | Admitting: Internal Medicine

## 2016-08-15 MED ORDER — DOXYCYCLINE HYCLATE 100 MG PO TABS: ORAL_TABLET | ORAL | 0 refills | Status: DC

## 2016-08-15 NOTE — Telephone Encounter (Signed)
Offer doxycycline 100 mg, # 8, 2 today then one daily 

## 2016-08-15 NOTE — Telephone Encounter (Signed)
Spoke with pt and informed her of CY recommendation. Pt agreed to try medication, her rx was sent to her pharmacy of choice. Pt had no further questions. Nothing further is needed.

## 2016-08-15 NOTE — Telephone Encounter (Signed)
Called and spoke to pt. Pt called in yesterday, 4.9.2018, with URI symptoms. Pt was producing clear mucus from cough yesterday but pt states this morning she has been coughing up more mucus and it is green in color. Pt states all her other s/s are the same from yesterday aside from the mucus production.   Dr. Annamaria Boots please advise. Thanks.   Allergies  Allergen Reactions  . Chlordiazepoxide-Clidinium Other (See Comments)    Sleepy,weak  . Biaxin [Clarithromycin]     Foul taste, abd pain, diarrhea  . Tramadol   . Codeine     REACTION: gi upset- only in high doses per pt.  Can tolerate cough syrup with codeine.     Current Outpatient Prescriptions on File Prior to Visit  Medication Sig Dispense Refill  . acetaminophen (TYLENOL) 500 MG tablet Take 500 mg by mouth every 6 (six) hours as needed for pain.     Marland Kitchen ALPRAZolam (XANAX) 0.5 MG tablet take 1/2 to 1 tablet by mouth twice a day 60 tablet 5  . aspirin 325 MG tablet Take 325 mg by mouth daily.      . B-D UF III MINI PEN NEEDLES 31G X 5 MM MISC USE 5 PER DAY TO INJECT INSULIN  0  . B-D ULTRAFINE III SHORT PEN 31G X 8 MM MISC TEST 3 TIMES A DAY 100 each 5  . bacitracin ophthalmic ointment Reported on 09/06/2015  0  . benzonatate (TESSALON) 200 MG capsule take 1 capsule by mouth three times a day if needed for cough 30 capsule 3  . brompheniramine-pseudoephedrine-DM 30-2-10 MG/5ML syrup Take 5-10 mLs by mouth every 6 (six) hours as needed. 140 mL 0  . cefdinir (OMNICEF) 300 MG capsule Take 1 capsule (300 mg total) by mouth 2 (two) times daily. 14 capsule 0  . celecoxib (CELEBREX) 100 MG capsule Take 1 capsule (100 mg total) by mouth 2 (two) times daily. 60 capsule 2  . celecoxib (CELEBREX) 100 MG capsule take 1 capsule by mouth twice a day 60 capsule 3  . cholestyramine (QUESTRAN) 4 g packet take 1 packet once daily if needed for diarrhea 60 packet 1  . dicyclomine (BENTYL) 10 MG capsule take 1 capsule by mouth once daily 30 capsule 5  .  fluconazole (DIFLUCAN) 150 MG tablet Take 1 tablet (150 mg total) by mouth once. 1 tablet 1  . glucose blood (ACCU-CHEK AVIVA PLUS) test strip Use as instructed to check blood sugars 8 times per day dx code 250.02 250 each 3  . insulin aspart (NOVOLOG FLEXPEN) 100 UNIT/ML FlexPen Inject 30 units at breakfast- 24 units at lunch- and 30 units at supper (Patient taking differently: Inject 24 units at breakfast- 24 units at lunch- and 30 units at supper) 10 pen 4  . insulin regular human CONCENTRATED (HUMULIN R U-500 KWIKPEN) 500 UNIT/ML kwikpen Inject 50 units three times daily (Patient taking differently: Inject 40 units in the am 45 units at lunch and 30 units at dinner) 6 mL 3  . Insulin Syringe-Needle U-100 (BD INSULIN SYRINGE ULTRAFINE) 31G X 15/64" 0.5 ML MISC Use 2 per day dx code E11.65 100 each 5  . magic mouthwash SOLN Swish with 2 teaspoonfuls by mouth as needed 240 mL 3  . meclizine (ANTIVERT) 25 MG tablet take 1 tablet by mouth three times a day if needed for dizziness 90 tablet 3  . methocarbamol (ROBAXIN) 500 MG tablet take 1 tablet by mouth three times a day if needed for 10  days 30 tablet 3  . metoprolol succinate (TOPROL-XL) 25 MG 24 hr tablet take 1 tablet by mouth once daily 30 tablet 5  . nystatin (MYCOSTATIN) 100000 UNIT/ML suspension     . omeprazole (PRILOSEC) 20 MG capsule Take 1 capsule (20 mg total) by mouth daily. 90 capsule 2  . OXYGEN 3-3.5 liters O2 pulsed    . pravastatin (PRAVACHOL) 20 MG tablet take 1 tablet by mouth once daily 30 tablet 5  . predniSONE (DELTASONE) 10 MG tablet take 1 tablet by mouth once daily with food 10 tablet 1  . STIOLTO RESPIMAT 2.5-2.5 MCG/ACT AERS inhale 2 puffs once daily 4 g 1  . TRESIBA FLEXTOUCH 200 UNIT/ML SOPN inject 94 units INTO THE SKIN DAILY 18 mL 3  . trimethoprim (TRIMPEX) 100 MG tablet Reported on 09/06/2015  1  . valsartan (DIOVAN) 160 MG tablet take 1 tablet by mouth once daily 30 tablet 5  . VIGAMOX 0.5 % ophthalmic solution  Reported on 09/06/2015    . XOPENEX HFA 45 MCG/ACT inhaler inhale 2 puffs INTO THE LUNGS every 6 hours if needed for wheezing or shortness of breath 15 Inhaler 11   No current facility-administered medications on file prior to visit.

## 2016-08-18 ENCOUNTER — Other Ambulatory Visit: Payer: Self-pay | Admitting: Endocrinology

## 2016-08-22 ENCOUNTER — Other Ambulatory Visit: Payer: Self-pay

## 2016-08-22 ENCOUNTER — Telehealth: Payer: Self-pay | Admitting: Internal Medicine

## 2016-08-22 MED ORDER — PREDNISONE 20 MG PO TABS: 20.0000 mg | ORAL_TABLET | Freq: Every day | ORAL | 0 refills | Status: DC

## 2016-08-22 MED ORDER — PRAVASTATIN SODIUM 20 MG PO TABS: 20.0000 mg | ORAL_TABLET | Freq: Every day | ORAL | 5 refills | Status: DC

## 2016-08-22 MED ORDER — PROMETHAZINE VC/CODEINE 6.25-5-10 MG/5ML PO SYRP: 5.0000 mL | ORAL_SOLUTION | Freq: Four times a day (QID) | ORAL | 0 refills | Status: DC | PRN

## 2016-08-22 NOTE — Telephone Encounter (Signed)
Oxygen is better. Sounds as if she needs time to improve. I don't have another inhaler to offer now. Would she like to have prometh codeine cough syrup  200 ml,   5 ml every 6 hours if needed for cough ? Ok to over-ride

## 2016-08-22 NOTE — Telephone Encounter (Signed)
Spoke with the pt and notified of recs per CDY  She verbalized understanding and nothing further needed  Rx called to pharm for cough syrup and pred sent

## 2016-08-22 NOTE — Telephone Encounter (Signed)
Ok to try prednisone 20 mg, # 4, 1 daily  If she thinks she needs an antibiotic because of fever or purulent sputum, let us know.  If she gets worse, she may need to go to ER

## 2016-08-22 NOTE — Telephone Encounter (Signed)
Pt states that she is having a lot of chest tightness and increased SOB.  Pt states that she actually feels that her breathing is worse with O2 than it was before and it is worse without O2 than it used to be >> Pt states that she has to take her O2 off the shower and to wash her face and she feels that she is doing worse without the oxygen. Pt states that she is having choking cough spells where she feels that she she will pass out -- this is new as well. Pt is okay with trying to Prometh-Codeine cough syrup but wanted Dr Annamaria Boots to know that her breathing/oxygen is worse not better.   Prometh-Codeine cough syrup needs to be called in.

## 2016-08-22 NOTE — Telephone Encounter (Signed)
Spoke with pt, who states she finished doxycycline yesterday that CY prescribed on 08/15/16. Pt reports of increased & prod cough with clear mucus & chest tightness. Pt states over the weekend O2 levels were 82-84 on room and recovered to 88 on 3L. Today O2 was at 94 on 3L.  Pt denies any fever, chills or sweats. Pt is wanting to know if there is another inhaler that may help with symptoms.  CY please advise. Thanks. Current Outpatient Prescriptions on File Prior to Visit  Medication Sig Dispense Refill  . acetaminophen (TYLENOL) 500 MG tablet Take 500 mg by mouth every 6 (six) hours as needed for pain.     Marland Kitchen ALPRAZolam (XANAX) 0.5 MG tablet take 1/2 to 1 tablet by mouth twice a day 60 tablet 5  . aspirin 325 MG tablet Take 325 mg by mouth daily.      . B-D UF III MINI PEN NEEDLES 31G X 5 MM MISC USE 5 PER DAY TO INJECT INSULIN  0  . B-D ULTRAFINE III SHORT PEN 31G X 8 MM MISC TEST 3 TIMES A DAY 100 each 5  . bacitracin ophthalmic ointment Reported on 09/06/2015  0  . benzonatate (TESSALON) 200 MG capsule take 1 capsule by mouth three times a day if needed for cough 30 capsule 3  . brompheniramine-pseudoephedrine-DM 30-2-10 MG/5ML syrup Take 5-10 mLs by mouth every 6 (six) hours as needed. 140 mL 0  . cefdinir (OMNICEF) 300 MG capsule Take 1 capsule (300 mg total) by mouth 2 (two) times daily. 14 capsule 0  . celecoxib (CELEBREX) 100 MG capsule Take 1 capsule (100 mg total) by mouth 2 (two) times daily. 60 capsule 2  . celecoxib (CELEBREX) 100 MG capsule take 1 capsule by mouth twice a day 60 capsule 3  . cholestyramine (QUESTRAN) 4 g packet take 1 packet once daily if needed for diarrhea 60 packet 1  . dicyclomine (BENTYL) 10 MG capsule take 1 capsule by mouth once daily 30 capsule 5  . doxycycline (VIBRA-TABS) 100 MG tablet Take 2 tabs today, then 1 tab daily 8 tablet 0  . fluconazole (DIFLUCAN) 150 MG tablet Take 1 tablet (150 mg total) by mouth once. 1 tablet 1  . glucose blood (ACCU-CHEK  AVIVA PLUS) test strip Use as instructed to check blood sugars 8 times per day dx code 250.02 250 each 3  . insulin aspart (NOVOLOG FLEXPEN) 100 UNIT/ML FlexPen Inject 30 units at breakfast- 24 units at lunch- and 30 units at supper (Patient taking differently: Inject 24 units at breakfast- 24 units at lunch- and 30 units at supper) 10 pen 4  . insulin regular human CONCENTRATED (HUMULIN R U-500 KWIKPEN) 500 UNIT/ML kwikpen Inject 50 units three times daily (Patient taking differently: Inject 40 units in the am 45 units at lunch and 30 units at dinner) 6 mL 3  . Insulin Syringe-Needle U-100 (BD INSULIN SYRINGE ULTRAFINE) 31G X 15/64" 0.5 ML MISC Use 2 per day dx code E11.65 100 each 5  . magic mouthwash SOLN Swish with 2 teaspoonfuls by mouth as needed 240 mL 3  . meclizine (ANTIVERT) 25 MG tablet take 1 tablet by mouth three times a day if needed for dizziness 90 tablet 3  . methocarbamol (ROBAXIN) 500 MG tablet take 1 tablet by mouth three times a day if needed for 10 days 30 tablet 3  . metoprolol succinate (TOPROL-XL) 25 MG 24 hr tablet take 1 tablet by mouth once daily 30 tablet 5  .  nystatin (MYCOSTATIN) 100000 UNIT/ML suspension     . omeprazole (PRILOSEC) 20 MG capsule Take 1 capsule (20 mg total) by mouth daily. 90 capsule 2  . OXYGEN 3-3.5 liters O2 pulsed    . pravastatin (PRAVACHOL) 20 MG tablet take 1 tablet by mouth once daily 30 tablet 5  . predniSONE (DELTASONE) 10 MG tablet take 1 tablet by mouth once daily with food 10 tablet 1  . STIOLTO RESPIMAT 2.5-2.5 MCG/ACT AERS inhale 2 puffs once daily 4 g 1  . TRESIBA FLEXTOUCH 200 UNIT/ML SOPN inject 94 units INTO THE SKIN DAILY 18 mL 3  . trimethoprim (TRIMPEX) 100 MG tablet Reported on 09/06/2015  1  . valsartan (DIOVAN) 160 MG tablet take 1 tablet by mouth once daily 30 tablet 5  . VIGAMOX 0.5 % ophthalmic solution Reported on 09/06/2015    . XOPENEX HFA 45 MCG/ACT inhaler inhale 2 puffs INTO THE LUNGS every 6 hours if needed for wheezing  or shortness of breath 15 Inhaler 11   No current facility-administered medications on file prior to visit.    Allergies  Allergen Reactions  . Chlordiazepoxide-Clidinium Other (See Comments)    Sleepy,weak  . Biaxin [Clarithromycin]     Foul taste, abd pain, diarrhea  . Tramadol   . Codeine     REACTION: gi upset- only in high doses per pt.  Can tolerate cough syrup with codeine.

## 2016-08-24 DIAGNOSIS — M791 Myalgia: Secondary | ICD-10-CM | POA: Diagnosis not present

## 2016-08-24 DIAGNOSIS — M47816 Spondylosis without myelopathy or radiculopathy, lumbar region: Secondary | ICD-10-CM | POA: Diagnosis not present

## 2016-08-24 DIAGNOSIS — M419 Scoliosis, unspecified: Secondary | ICD-10-CM | POA: Diagnosis not present

## 2016-08-24 DIAGNOSIS — M542 Cervicalgia: Secondary | ICD-10-CM | POA: Diagnosis not present

## 2016-08-25 NOTE — Telephone Encounter (Signed)
Pt calling about the meds her back MD put her on, she did not know the name of the med and is just asking for Lattie Haw to call her back, please

## 2016-08-25 NOTE — Telephone Encounter (Signed)
This will be okay as long as it is not more than 25 mg and for short time.  This is similar to Elavil

## 2016-08-25 NOTE — Telephone Encounter (Signed)
I spoke to the patient and the prescription is 72m- and she stated an understanding of the instructions

## 2016-08-29 ENCOUNTER — Other Ambulatory Visit: Payer: Self-pay

## 2016-08-29 MED ORDER — MAGIC MOUTHWASH: ORAL | 3 refills | Status: DC

## 2016-09-05 ENCOUNTER — Telehealth: Payer: Self-pay | Admitting: Internal Medicine

## 2016-09-05 MED ORDER — PREDNISONE 5 MG PO TABS: 5.0000 mg | ORAL_TABLET | Freq: Every day | ORAL | 3 refills | Status: DC

## 2016-09-05 NOTE — Telephone Encounter (Signed)
CY  Please Advise-  Pt called and stated she is having trouble with her breathing again. Pt states she finished the prednisone you placed her on, from 08/22/16 and she felt better when she was on it but she is c/o increase sob,wheezing,lots of coughing spells with phlegm but not discolored as of yet,and chest tightness. Denies fever. She also stated the antibiotic  you placed her on 08/15/16 caused her itchy in her vaginal area and she wanted to know it something could be prescribed for that as well, she requested some type of lotion.  Allergies  Allergen Reactions  . Chlordiazepoxide-Clidinium Other (See Comments)    Sleepy,weak  . Biaxin [Clarithromycin]     Foul taste, abd pain, diarrhea  . Tramadol   . Codeine     REACTION: gi upset- only in high doses per pt.  Can tolerate cough syrup with codeine.    Current Outpatient Prescriptions on File Prior to Visit  Medication Sig Dispense Refill  . acetaminophen (TYLENOL) 500 MG tablet Take 500 mg by mouth every 6 (six) hours as needed for pain.     Marland Kitchen ALPRAZolam (XANAX) 0.5 MG tablet take 1/2 to 1 tablet by mouth twice a day 60 tablet 5  . aspirin 325 MG tablet Take 325 mg by mouth daily.      . B-D UF III MINI PEN NEEDLES 31G X 5 MM MISC USE 5 PER DAY TO INJECT INSULIN  0  . B-D ULTRAFINE III SHORT PEN 31G X 8 MM MISC TEST 3 TIMES A DAY 100 each 5  . bacitracin ophthalmic ointment Reported on 09/06/2015  0  . benzonatate (TESSALON) 200 MG capsule take 1 capsule by mouth three times a day if needed for cough 30 capsule 3  . brompheniramine-pseudoephedrine-DM 30-2-10 MG/5ML syrup Take 5-10 mLs by mouth every 6 (six) hours as needed. 140 mL 0  . cefdinir (OMNICEF) 300 MG capsule Take 1 capsule (300 mg total) by mouth 2 (two) times daily. 14 capsule 0  . celecoxib (CELEBREX) 100 MG capsule Take 1 capsule (100 mg total) by mouth 2 (two) times daily. 60 capsule 2  . celecoxib (CELEBREX) 100 MG capsule take 1 capsule by mouth twice a day 60 capsule 3   . cholestyramine (QUESTRAN) 4 g packet take 1 packet once daily if needed for diarrhea 60 packet 1  . dicyclomine (BENTYL) 10 MG capsule take 1 capsule by mouth once daily 30 capsule 5  . doxycycline (VIBRA-TABS) 100 MG tablet Take 2 tabs today, then 1 tab daily 8 tablet 0  . fluconazole (DIFLUCAN) 150 MG tablet Take 1 tablet (150 mg total) by mouth once. 1 tablet 1  . glucose blood (ACCU-CHEK AVIVA PLUS) test strip Use as instructed to check blood sugars 8 times per day dx code 250.02 250 each 3  . insulin aspart (NOVOLOG FLEXPEN) 100 UNIT/ML FlexPen Inject 30 units at breakfast- 24 units at lunch- and 30 units at supper (Patient taking differently: Inject 24 units at breakfast- 24 units at lunch- and 30 units at supper) 10 pen 4  . insulin regular human CONCENTRATED (HUMULIN R U-500 KWIKPEN) 500 UNIT/ML kwikpen Inject 50 units three times daily (Patient taking differently: Inject 40 units in the am 45 units at lunch and 30 units at dinner) 6 mL 3  . Insulin Syringe-Needle U-100 (BD INSULIN SYRINGE ULTRAFINE) 31G X 15/64" 0.5 ML MISC Use 2 per day dx code E11.65 100 each 5  . magic mouthwash SOLN Swish with 2  teaspoonfuls by mouth as needed 240 mL 3  . meclizine (ANTIVERT) 25 MG tablet take 1 tablet by mouth three times a day if needed for dizziness 90 tablet 3  . methocarbamol (ROBAXIN) 500 MG tablet take 1 tablet by mouth three times a day if needed for 10 days 30 tablet 3  . metoprolol succinate (TOPROL-XL) 25 MG 24 hr tablet take 1 tablet by mouth once daily 30 tablet 5  . nystatin (MYCOSTATIN) 100000 UNIT/ML suspension     . omeprazole (PRILOSEC) 20 MG capsule Take 1 capsule (20 mg total) by mouth daily. 90 capsule 2  . OXYGEN 3-3.5 liters O2 pulsed    . pravastatin (PRAVACHOL) 20 MG tablet Take 1 tablet (20 mg total) by mouth daily. 90 tablet 5  . predniSONE (DELTASONE) 10 MG tablet take 1 tablet by mouth once daily with food 10 tablet 1  . predniSONE (DELTASONE) 20 MG tablet Take 1 tablet  (20 mg total) by mouth daily with breakfast. 4 tablet 0  . Promethazine-Phenyleph-Codeine (PROMETHAZINE VC/CODEINE) 6.25-5-10 MG/5ML SYRP Take 5 mLs by mouth every 6 (six) hours as needed. 200 mL 0  . STIOLTO RESPIMAT 2.5-2.5 MCG/ACT AERS inhale 2 puffs once daily 4 g 1  . TRESIBA FLEXTOUCH 200 UNIT/ML SOPN inject 94 units INTO THE SKIN DAILY 18 mL 3  . trimethoprim (TRIMPEX) 100 MG tablet Reported on 09/06/2015  1  . valsartan (DIOVAN) 160 MG tablet take 1 tablet by mouth once daily 30 tablet 5  . VIGAMOX 0.5 % ophthalmic solution Reported on 09/06/2015    . XOPENEX HFA 45 MCG/ACT inhaler inhale 2 puffs INTO THE LUNGS every 6 hours if needed for wheezing or shortness of breath 15 Inhaler 11   No current facility-administered medications on file prior to visit.

## 2016-09-05 NOTE — Telephone Encounter (Signed)
Gyne-lotrimin is available otc as a topical for yeast.  Suggest order- prednisone 5 mg, # 30, 1 daily, ref x 3

## 2016-09-05 NOTE — Telephone Encounter (Signed)
Called and spoke with pt and she is aware of CY recs.  She is aware of med sent to her pharmacy.

## 2016-09-06 ENCOUNTER — Telehealth: Payer: Self-pay | Admitting: Internal Medicine

## 2016-09-06 MED ORDER — CLOTRIMAZOLE 1 % VA CREA: TOPICAL_CREAM | VAGINAL | 0 refills | Status: DC

## 2016-09-06 NOTE — Telephone Encounter (Signed)
Offer clotrimazole vaginal 1% 1 tube    1 applicator full   PV  Every night at bedtime x 7 days

## 2016-09-06 NOTE — Telephone Encounter (Signed)
Spoke with pt and informed her CY'S recommendation. Pt verified which pharmacy she wanted this sent to. The rx was sent in. Nothing further is needed. She had no further questions

## 2016-09-06 NOTE — Telephone Encounter (Signed)
Spoke with pt, states that the OTC gyne-lotrimin lotion that she was instructed to purchase yesterday was too expensive ($8-10/tube).  I advised pt that this may be available in a generic through the pharmacy brand instead of the name brand- Clotrimazole.  Pt states she cannot afford the cost of this medication.  Pt is requesting that CY order a rx so her insurance can cover the cost of this medication.    CY please advise.  Thanks.

## 2016-09-11 ENCOUNTER — Telehealth: Payer: Self-pay | Admitting: Endocrinology

## 2016-09-11 NOTE — Telephone Encounter (Signed)
She has been having diarrhea for some time along with belching Advised her to start taking Questran as before instead of Imodium Reduce Tresiba down to 68.  Patient contacted today

## 2016-09-11 NOTE — Telephone Encounter (Signed)
Favour [place] Self [phone]  [name] called to say her sugars are dropping really low since last Friday. On Friday they were 82 then went 208 they were in the 80's over the weekend and this morning they dropped to somewhere in the 70's and when she took them just a little bit ago after eating they were 85. She is very weak and tired. Please call and advise.

## 2016-09-18 ENCOUNTER — Ambulatory Visit (INDEPENDENT_AMBULATORY_CARE_PROVIDER_SITE_OTHER): Payer: Medicare Other | Admitting: Endocrinology

## 2016-09-18 ENCOUNTER — Encounter: Payer: Self-pay | Admitting: Endocrinology

## 2016-09-18 VITALS — BP 138/82 | HR 90 | Ht 63.0 in | Wt 200.0 lb

## 2016-09-18 DIAGNOSIS — Z794 Long term (current) use of insulin: Secondary | ICD-10-CM | POA: Diagnosis not present

## 2016-09-18 DIAGNOSIS — R252 Cramp and spasm: Secondary | ICD-10-CM | POA: Diagnosis not present

## 2016-09-18 DIAGNOSIS — I10 Essential (primary) hypertension: Secondary | ICD-10-CM

## 2016-09-18 DIAGNOSIS — E1165 Type 2 diabetes mellitus with hyperglycemia: Secondary | ICD-10-CM

## 2016-09-18 LAB — POCT GLYCOSYLATED HEMOGLOBIN (HGB A1C): HEMOGLOBIN A1C: 9.4

## 2016-09-18 NOTE — Progress Notes (Signed)
Patient ID: [name], female   DOB: 21-Aug-1935, 81 y.o.   MRN: [id number]   Reason for Appointment: Diabetes and follow-up of multiple problems  History of Present Illness    PROBLEM 1:  DIABETES type II since 1980   She has been on insulin using Lantus and Humalog for several years and usually A1c around 8% despite fairly good compliance with diet, exercise and glucose monitoring. She also had been on Actos previously which probably benefited her especially with her fatty liver but she stopped it because of fear of side effects   She was changed from NovoLog to U-500 insulin starting in 02/2016; she did not related was controlling her sugars and preferred to go back to NovoLog.    Recent history:   Insulin regimen: Tresiba  68 units acb   NOVOLOG before meals 24-24-30   Her A1c has been consistently around 9- 10% and now 9.4   Her A1c has been slightly better than usual  More recently she started having relatively lower blood sugars in the last week or so mostly when she was starting to have episodes of diarrhea and some nausea.  Her TRESIBA was reduced by 6 units but now fasting readings are starting to increase  Overall HIGHEST blood sugars on an average or overnight and at bedtime  She says that only occasionally she will forget to take her NovoLog with her meals causing readings over 300  However she still has inconsistent blood sugars at various times with standard deviation 73  FASTING blood sugars on an average or relatively better with that sugars are relatively higher in the afternoons and evenings and averaging over 200  She is now taking prednisone 5 mg in the morning for her cough  Not able to do any physical activity because of back pain and fatigue  She thinks she is still eating small portions  Oral hypoglycemic drugs: none       Side effects from medications: None         Proper timing of medications in relation to meals: Yes.            Monitors blood glucose:  5 times a day   Glucometer:  Accu-Chek         Blood Glucose averages recently from meter download:  Mean values apply above for all meters except median for One Touch  PRE-MEAL Fasting Lunch Bedtime  Overnight  Overall  Glucose range:  93-220   166-301   10 6-261    Mean/median: 148  151  233  235  195   Suppertime average 211  Meals: 3 meals per day.  Smaller portions; lunch 1-2 pm Supper 6 to 7 PM     Physical activity: exercise: Unable to do any because of dyspnea and fatigue            Dietician visit: Most recent: Several years ago           Complications: are: Peripheral neuropathy with sensory loss     Wt Readings from Last 3 Encounters:  09/18/16 200 lb (90.7 kg)  07/25/16 203 lb 9.6 oz (92.4 kg)  06/21/16 200 lb (90.7 kg)    Lab Results  Component Value Date   HGBA1C 9.4 09/18/2016   HGBA1C 9.6 06/21/2016   HGBA1C 10.1 02/15/2016   Lab Results  Component Value Date   MICROALBUR 6.9 (H) 10/14/2014   LDLCALC 59 06/21/2016   CREATININE 0.69 06/21/2016     OTHER  Current problems  are detailed in review of systems   Allergies as of 09/18/2016      Reactions   Chlordiazepoxide-clidinium Other (See Comments)   Sleepy,weak   Biaxin [clarithromycin]    Foul taste, abd pain, diarrhea   Tramadol    Codeine    REACTION: gi upset- only in high doses per pt.  Can tolerate cough syrup with codeine.       Medication List       Accurate as of 09/18/16  8:14 PM. Always use your most recent med list.          acetaminophen 500 MG tablet Commonly known as:  TYLENOL Take 500 mg by mouth every 6 (six) hours as needed for pain.   ALPRAZolam 0.5 MG tablet Commonly known as:  XANAX take 1/2 to 1 tablet by mouth twice a day   aspirin 325 MG tablet Take 325 mg by mouth daily.   B-D UF III MINI PEN NEEDLES 31G X 5 MM Misc Generic drug:  Insulin Pen Needle USE 5 PER DAY TO INJECT INSULIN   B-D ULTRAFINE III SHORT PEN 31G X 8 MM  Misc Generic drug:  Insulin Pen Needle TEST 3 TIMES A DAY   bacitracin ophthalmic ointment Reported on 09/06/2015   benzonatate 200 MG capsule Commonly known as:  TESSALON take 1 capsule by mouth three times a day if needed for cough   brompheniramine-pseudoephedrine-DM 30-2-10 MG/5ML syrup Take 5-10 mLs by mouth every 6 (six) hours as needed.   cholestyramine 4 g packet Commonly known as:  QUESTRAN take 1 packet once daily if needed for diarrhea   clotrimazole 1 % vaginal cream Commonly known as:  GYNE-LOTRIMIN Every night at bedtime for 7 days   dicyclomine 10 MG capsule Commonly known as:  BENTYL take 1 capsule by mouth once daily   doxycycline 100 MG tablet Commonly known as:  VIBRA-TABS Take 2 tabs today, then 1 tab daily   fluconazole 150 MG tablet Commonly known as:  DIFLUCAN Take 1 tablet (150 mg total) by mouth once.   glucose blood test strip Commonly known as:  ACCU-CHEK AVIVA PLUS Use as instructed to check blood sugars 8 times per day dx code 250.02   insulin aspart 100 UNIT/ML FlexPen Commonly known as:  NOVOLOG FLEXPEN Inject 30 units at breakfast- 24 units at lunch- and 30 units at supper   insulin regular human CONCENTRATED 500 UNIT/ML kwikpen Commonly known as:  HUMULIN R U-500 KWIKPEN Inject 50 units three times daily   Insulin Syringe-Needle U-100 31G X 15/64" 0.5 ML Misc Commonly known as:  BD INSULIN SYRINGE ULTRAFINE Use 2 per day dx code E11.65   magic mouthwash Soln Swish with 2 teaspoonfuls by mouth as needed   meclizine 25 MG tablet Commonly known as:  ANTIVERT take 1 tablet by mouth three times a day if needed for dizziness   methocarbamol 500 MG tablet Commonly known as:  ROBAXIN take 1 tablet by mouth three times a day if needed for 10 days   metoprolol succinate 25 MG 24 hr tablet Commonly known as:  TOPROL-XL take 1 tablet by mouth once daily   nystatin 100000 UNIT/ML suspension Commonly known as:  MYCOSTATIN    omeprazole 20 MG capsule Commonly known as:  PRILOSEC Take 1 capsule (20 mg total) by mouth daily.   OXYGEN 3-3.5 liters O2 pulsed   pravastatin 20 MG tablet Commonly known as:  PRAVACHOL Take 1 tablet (20 mg total) by mouth daily.  predniSONE 5 MG tablet Commonly known as:  DELTASONE Take 1 tablet (5 mg total) by mouth daily with breakfast.   PROMETHAZINE VC/CODEINE 6.25-5-10 MG/5ML Syrp Take 5 mLs by mouth every 6 (six) hours as needed.   STIOLTO RESPIMAT 2.5-2.5 MCG/ACT Aers Generic drug:  Tiotropium Bromide-Olodaterol inhale 2 puffs once daily   TRESIBA FLEXTOUCH 200 UNIT/ML Sopn Generic drug:  Insulin Degludec inject 94 units INTO THE SKIN DAILY   trimethoprim 100 MG tablet Commonly known as:  TRIMPEX Reported on 09/06/2015   valsartan 160 MG tablet Commonly known as:  DIOVAN take 1 tablet by mouth once daily   VIGAMOX 0.5 % ophthalmic solution Generic drug:  moxifloxacin Reported on 09/06/2015   XOPENEX HFA 45 MCG/ACT inhaler Generic drug:  levalbuterol inhale 2 puffs INTO THE LUNGS every 6 hours if needed for wheezing or shortness of breath       Allergies:  Allergies  Allergen Reactions  . Chlordiazepoxide-Clidinium Other (See Comments)    Sleepy,weak  . Biaxin [Clarithromycin]     Foul taste, abd pain, diarrhea  . Tramadol   . Codeine     REACTION: gi upset- only in high doses per pt.  Can tolerate cough syrup with codeine.     Past Medical History:  Diagnosis Date  . Allergic rhinitis, cause unspecified    Sinus CT Rec 12-23-2009  . Arthritis   . Benign paroxysmal positional vertigo 08/09/2012  . 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 Complication of anesthesia    takes along time to wake up  . Diabetes mellitus   . Diarrhea   . Diverticulosis   . Esophageal reflux   . Esophageal stricture   . GERD (gastroesophageal reflux disease)   . Glaucoma   . Hiatal hernia   . Hypertension    . Irritable bowel syndrome   . Neuropathy in diabetes ([place]) 08/09/2012  . Other chronic nonalcoholic liver disease   . Oxygen deficiency   . Sciatica   . Shingles 2010  . Shortness of breath   . Steatohepatitis     Past Surgical History:  Procedure Laterality Date  . APPENDECTOMY    . CARPAL TUNNEL RELEASE     right hand  . CATARACT EXTRACTION     Bilateral  . CHOLECYSTECTOMY OPEN  1978  . COLONOSCOPY  07-2001   mild diverticulosis  . ESOPHAGOGASTRODUODENOSCOPY  1275,17-00   H Hernia,es.stricture s/p dil 18F  . EYE SURGERY    . LIVER BIOPSY  08-1992  . PARATHYROID EXPLORATION    . TONSILLECTOMY AND ADENOIDECTOMY    . TOTAL ABDOMINAL HYSTERECTOMY    . ULNAR NERVE TRANSPOSITION  12/07/2011   Procedure: ULNAR NERVE DECOMPRESSION/TRANSPOSITION;this was cancelled-not done  Surgeon: Cammie Sickle., MD;  Location: [facility];  Service: Orthopedics;  Laterality: Right;  right ulnar nerve in situ decompression  . ULNAR TUNNEL RELEASE  03/07/2012   Procedure: CUBITAL TUNNEL RELEASE;  Surgeon: Roseanne Kaufman, MD;  Location: [facility];  Service: Orthopedics;  Laterality: Right;  ulnar nerve release at the elbow      Family History  Problem Relation Age of Onset  . Lung cancer Mother        small cell;Byssinosis  . Heart disease Father   . Lung cancer Sister   . Liver cancer Sister        ? mets from another area of the body  . Diabetes Unknown  grandmother  . Stroke Maternal Grandfather     Social History:  reports that she quit smoking about 28 years ago. Her smoking use included Cigarettes. She has never used smokeless tobacco. She reports that she does not drink alcohol or use drugs.  Review of Systems:  Asking about nocturnal muscle cramps: This or recurrent and intermittent again.  Was recommended none water but she thinks it is high in sugar content  LOW back pain, left hip pain:  She has been to various orthopedic surgeons and  now is Still having pain despite radiofrequency ablation procedure done and steroid injections She is still having a lot of pain and is not able to stand for long  HYPERTENSION:   blood pressure is well controlled and consistent  She is compliant with half tablet of 160 mg Diovan  BP Readings from Last 3 Encounters:  09/18/16 138/82  07/25/16 126/82  06/21/16 140/72     She takes Xanax at night mostly and lorazepam in the daytime for anxiety  HYPERLIPIDEMIA:  The lipid abnormality consists of elevated LDL treated with pravastatin 20 mg  which she is tolerating Her last LDL is  below 100     Lab Results  Component Value Date   CHOL 142 06/21/2016   HDL 46.90 06/21/2016   LDLCALC 59 06/21/2016   LDLDIRECT 89.0 10/14/2014   TRIG 179.0 (H) 06/21/2016   CHOLHDL 3 06/21/2016    She is followed by pulmonologist for chronic COPD, taking home oxygen   Diarrhea: She has had Recurrence of her long-standing intermittent diarrhea more recently When she called about a week ago she was taking Imodium or Lomotil with inconsistent relief but now is going back to Questran as needed with improvement Nausea is better also Probably has had IBS, no bowel pain recently  NEUROPATHY: She has numbness in her feet.  Also has tingling and numbness in her hands Foot exam done in 10/17: Foot exam shows sensory loss distally, normal pulses and no skin lesions Does not like pain medications   Examination:   BP 138/82   Pulse 90   Ht 5' 3"  (1.6 m)   Wt 200 lb (90.7 kg)   SpO2 92%   BMI 35.43 kg/m   Body mass index is 35.43 kg/m.    She has a callus-like reckoning with mild tenderness on the right lower Achilles tendon area slightly laterally Skin on the feet is normal No edema  ASSESSMENT/ PLAN:    Diabetes type 2 with obesity See history of present illness for detailed discussion of  current management, blood sugar patterns and problems identified  As before her A1c is still high  although slightly better than usual at 9.4 Currently only on basal bolus insulin regimen and requiring large doses More recently has relatively lower readings and Tyler Aas was reduced since she had GI symptoms However may be getting higher readings later in the day because of starting prednisone She is fairly compliant with monitoring her blood sugar at various times Again discussed adjustment of mealtime insulin based on pre-meal blood sugar which is somewhat inconsistent Also given guidelines for increasing back her Toujeo by 4 units for fasting readings over 160 consistently  NOVOLOG: She will go up to 28 at lunch and 34 at suppertime Call if not consistently better  HYPERTENSION:  Fairly good control   Irritable bowel syndrome: Diarrhea controlled with Questran  Right heel pain and subcutaneous thickening: She will see the podiatrist , reassured her that this is  not diabetes related  LIPIDS: To be checked again on the next visit  She was reassured that she can take tonic water at bedtime, does not want any prescription medications.  Cramps  Patient Instructions  NOVOLOG before meals 24-28-34  If sugars stay > 160 in am then go to 72 units in am  Tonic water 6 oz at bedtime  Counseling time on subjects discussed above is over 50% of today's 25 minute visit  [name] 09/18/2016, 8:14 PM

## 2016-09-18 NOTE — Patient Instructions (Addendum)
NOVOLOG before meals 24-28-34  If sugars stay > 160 in am then go to 72 units in am  Tonic water 6 oz at bedtime

## 2016-09-19 ENCOUNTER — Other Ambulatory Visit: Payer: Self-pay | Admitting: Endocrinology

## 2016-09-27 ENCOUNTER — Telehealth: Payer: Self-pay | Admitting: Internal Medicine

## 2016-09-27 NOTE — Telephone Encounter (Signed)
Received a PA request from [employer]. Started PA on [email or web address], key is [id number]. Per CMM, a PA is not needed for this medication. Nothing else is needed.

## 2016-10-17 ENCOUNTER — Telehealth: Payer: Self-pay | Admitting: Internal Medicine

## 2016-10-17 MED ORDER — AMOXICILLIN 500 MG PO TABS: 500.0000 mg | ORAL_TABLET | Freq: Two times a day (BID) | ORAL | 0 refills | Status: DC

## 2016-10-17 NOTE — Telephone Encounter (Signed)
Spoke with pt. She is aware of CY's recommendation. Rx has been sent in. Nothing further was needed.  

## 2016-10-17 NOTE — Telephone Encounter (Signed)
CY  Please Advise-Sick Message  Pt called in requesting an antibiotic. Pt states her sx started last week but has gradually gotten worse. She is c/o lots of coughing spells with yellow mucus,she is sneezing,she has some nasal drainage,sore throat,and some chest tightness, she is having increase sob, and feels very fatigue Denies fever. She is still on her prednisone.   Pharmacy: [facility], [place]   Allergies  Allergen Reactions  . Chlordiazepoxide-Clidinium Other (See Comments)    Sleepy,weak  . Biaxin [Clarithromycin]     Foul taste, abd pain, diarrhea  . Tramadol   . Codeine     REACTION: gi upset- only in high doses per pt.  Can tolerate cough syrup with codeine.    Current Outpatient Prescriptions on File Prior to Visit  Medication Sig Dispense Refill  . acetaminophen (TYLENOL) 500 MG tablet Take 500 mg by mouth every 6 (six) hours as needed for pain.     Marland Kitchen ALPRAZolam (XANAX) 0.5 MG tablet take 1/2 to 1 tablet by mouth twice a day 60 tablet 5  . aspirin 325 MG tablet Take 325 mg by mouth daily.      . B-D UF III MINI PEN NEEDLES 31G X 5 MM MISC USE 5 PER DAY TO INJECT INSULIN  0  . B-D ULTRAFINE III SHORT PEN 31G X 8 MM MISC TEST 3 TIMES A DAY 100 each 5  . bacitracin ophthalmic ointment Reported on 09/06/2015  0  . benzonatate (TESSALON) 200 MG capsule take 1 capsule by mouth three times a day if needed for cough (Patient not taking: Reported on 09/18/2016) 30 capsule 3  . brompheniramine-pseudoephedrine-DM 30-2-10 MG/5ML syrup Take 5-10 mLs by mouth every 6 (six) hours as needed. 140 mL 0  . cholestyramine (QUESTRAN) 4 g packet take 1 packet once daily if needed for diarrhea 60 packet 1  . clotrimazole (GYNE-LOTRIMIN) 1 % vaginal cream Every night at bedtime for 7 days 45 g 0  . dicyclomine (BENTYL) 10 MG capsule take 1 capsule by mouth once daily 30 capsule 5  . doxycycline (VIBRA-TABS) 100 MG tablet Take 2 tabs today, then 1 tab daily 8 tablet 0  . fluconazole (DIFLUCAN) 150  MG tablet Take 1 tablet (150 mg total) by mouth once. 1 tablet 1  . glucose blood (ACCU-CHEK AVIVA PLUS) test strip Use as instructed to check blood sugars 8 times per day dx code 250.02 250 each 3  . insulin aspart (NOVOLOG FLEXPEN) 100 UNIT/ML FlexPen Inject 30 units at breakfast- 24 units at lunch- and 30 units at supper (Patient taking differently: Inject 24 units at breakfast- 24 units at lunch- and 30 units at supper) 10 pen 4  . insulin regular human CONCENTRATED (HUMULIN R U-500 KWIKPEN) 500 UNIT/ML kwikpen Inject 50 units three times daily (Patient not taking: Reported on 09/18/2016) 6 mL 3  . Insulin Syringe-Needle U-100 (BD INSULIN SYRINGE ULTRAFINE) 31G X 15/64" 0.5 ML MISC Use 2 per day dx code E11.65 100 each 5  . magic mouthwash SOLN Swish with 2 teaspoonfuls by mouth as needed 240 mL 3  . meclizine (ANTIVERT) 25 MG tablet take 1 tablet by mouth three times a day if needed for dizziness 90 tablet 3  . methocarbamol (ROBAXIN) 500 MG tablet take 1 tablet by mouth three times a day if needed for 10 days 30 tablet 3  . metoprolol succinate (TOPROL-XL) 25 MG 24 hr tablet take 1 tablet by mouth once daily 30 tablet 5  . nystatin (MYCOSTATIN)  100000 UNIT/ML suspension     . omeprazole (PRILOSEC) 20 MG capsule Take 1 capsule (20 mg total) by mouth daily. 90 capsule 2  . OXYGEN 3-3.5 liters O2 pulsed    . pravastatin (PRAVACHOL) 20 MG tablet Take 1 tablet (20 mg total) by mouth daily. 90 tablet 5  . predniSONE (DELTASONE) 5 MG tablet Take 1 tablet (5 mg total) by mouth daily with breakfast. 30 tablet 3  . Promethazine-Phenyleph-Codeine (PROMETHAZINE VC/CODEINE) 6.25-5-10 MG/5ML SYRP Take 5 mLs by mouth every 6 (six) hours as needed. 200 mL 0  . STIOLTO RESPIMAT 2.5-2.5 MCG/ACT AERS inhale 2 puffs once daily 4 g 1  . TRESIBA FLEXTOUCH 200 UNIT/ML SOPN inject 94 units INTO THE SKIN DAILY (Patient taking differently: Inject 68 Units into the skin daily. ) 18 mL 3  . trimethoprim (TRIMPEX) 100 MG  tablet Reported on 09/06/2015  1  . valsartan (DIOVAN) 160 MG tablet take 1 tablet by mouth once daily 30 tablet 5  . VIGAMOX 0.5 % ophthalmic solution Reported on 09/06/2015    . XOPENEX HFA 45 MCG/ACT inhaler inhale 2 puffs INTO THE LUNGS every 6 hours if needed for wheezing or shortness of breath 15 Inhaler 11   No current facility-administered medications on file prior to visit.

## 2016-10-17 NOTE — Telephone Encounter (Signed)
Offer amoxacillin 500 mg, # 14, 1 twice daily

## 2016-10-20 ENCOUNTER — Other Ambulatory Visit: Payer: Self-pay | Admitting: Internal Medicine

## 2016-10-26 DIAGNOSIS — M419 Scoliosis, unspecified: Secondary | ICD-10-CM | POA: Diagnosis not present

## 2016-10-26 DIAGNOSIS — M47816 Spondylosis without myelopathy or radiculopathy, lumbar region: Secondary | ICD-10-CM | POA: Diagnosis not present

## 2016-10-26 DIAGNOSIS — M7552 Bursitis of left shoulder: Secondary | ICD-10-CM | POA: Diagnosis not present

## 2016-10-26 DIAGNOSIS — M791 Myalgia: Secondary | ICD-10-CM | POA: Diagnosis not present

## 2016-10-26 DIAGNOSIS — Z4689 Encounter for fitting and adjustment of other specified devices: Secondary | ICD-10-CM | POA: Diagnosis not present

## 2016-10-26 DIAGNOSIS — M75102 Unspecified rotator cuff tear or rupture of left shoulder, not specified as traumatic: Secondary | ICD-10-CM | POA: Diagnosis not present

## 2016-10-31 ENCOUNTER — Other Ambulatory Visit: Payer: Self-pay

## 2016-10-31 ENCOUNTER — Other Ambulatory Visit: Payer: Self-pay | Admitting: Endocrinology

## 2016-10-31 ENCOUNTER — Telehealth: Payer: Self-pay | Admitting: Endocrinology

## 2016-10-31 DIAGNOSIS — K580 Irritable bowel syndrome with diarrhea: Secondary | ICD-10-CM

## 2016-10-31 MED ORDER — DIPHENOXYLATE-ATROPINE 2.5-0.025 MG PO TABS: 1.0000 | ORAL_TABLET | Freq: Three times a day (TID) | ORAL | 0 refills | Status: DC | PRN

## 2016-10-31 MED ORDER — HYDROCORTISONE 2.5 % RE CREA: 1.0000 "application " | TOPICAL_CREAM | Freq: Three times a day (TID) | RECTAL | 0 refills | Status: DC

## 2016-10-31 NOTE — Telephone Encounter (Signed)
Patient called back and I gave her the message. She is asking what to use for her rectum, she said she is raw and has severe burning. Please advise.

## 2016-10-31 NOTE — Telephone Encounter (Signed)
She also stated that she has not seen Dr. Maurene Capes her GI doctor and she has retired and she needs a referral for a new GI doctor.

## 2016-10-31 NOTE — Telephone Encounter (Signed)
Patient asking for return phone call when possible. States she has a stomach bug and needs something for it.

## 2016-10-31 NOTE — Telephone Encounter (Signed)
Please call in Anusol [facility] to apply a times a day

## 2016-10-31 NOTE — Telephone Encounter (Signed)
Called patient and she stated that she is having stomach pains all over her stomach and she has had the worst diarrhea and stated that she has mucous in her bowl movements. She stated that she is taking the powder to help her stool and also taken the Immodium and nothing is helping. Her blood sugars are also lower than normal. She stated that she takes a sip of water and it goes right through her. Please advise.

## 2016-10-31 NOTE — Telephone Encounter (Signed)
Called patient to let her know that both of her prescriptions have been sent to the pharmacy and to make sure this is ready so she can come pick them up.

## 2016-10-31 NOTE — Telephone Encounter (Signed)
She has had persistent diarrhea since Saturday with some cramping.  No vomiting, no chills Will have her try Lomotil for 1 or 2 days and increase Bentyl up to 3 times a day If she is not better by Thursday she will need to come in for stool cultures Will also send GI referral

## 2016-11-02 ENCOUNTER — Other Ambulatory Visit: Payer: Self-pay | Admitting: Rehabilitation

## 2016-11-02 DIAGNOSIS — M47816 Spondylosis without myelopathy or radiculopathy, lumbar region: Secondary | ICD-10-CM

## 2016-11-03 ENCOUNTER — Other Ambulatory Visit: Payer: Self-pay | Admitting: Endocrinology

## 2016-11-03 ENCOUNTER — Other Ambulatory Visit: Payer: Self-pay

## 2016-11-03 MED ORDER — DIPHENOXYLATE-ATROPINE 2.5-0.025 MG PO TABS: 1.0000 | ORAL_TABLET | Freq: Three times a day (TID) | ORAL | 0 refills | Status: DC | PRN

## 2016-11-03 NOTE — Telephone Encounter (Signed)
Pt had severe diarrhea on Mon and Tuesday. Wed it stopped. Thursday no diarrhea until at middle of the night. Still very loose stool today  Please advise. Please send in more medicine if she needs it as soon as we can. Thank you

## 2016-11-03 NOTE — Telephone Encounter (Signed)
[facility] and let them know that I had faxed them the order earlier today. The man I spoke with said they already received and filled for her.

## 2016-11-03 NOTE — Telephone Encounter (Signed)
Called patient and she stated that she needs more Lomotil for her diarrhea. She has been continuing to take the Bentyl 3 times a day. She also stated that she has had more heartburn than normal. She stated that her bowels have turned a yellow and orange color. She does have an appointment for the GI doctor the first of August. She stated that she will go to the ER if this gets worse.

## 2016-11-03 NOTE — Telephone Encounter (Signed)
Pharmacy stated that she needs more of the Lomotil before the weekend. Patient is still in pain. Please advise before the weekend.

## 2016-11-06 ENCOUNTER — Encounter (HOSPITAL_COMMUNITY): Payer: Self-pay | Admitting: Emergency Medicine

## 2016-11-06 ENCOUNTER — Other Ambulatory Visit: Payer: Self-pay

## 2016-11-06 ENCOUNTER — Emergency Department (HOSPITAL_COMMUNITY)
Admission: EM | Admit: 2016-11-06 | Discharge: 2016-11-06 | Disposition: A | Discharge status: Home / Self Care | Payer: Medicare Other | Attending: Emergency Medicine | Admitting: Emergency Medicine

## 2016-11-06 DIAGNOSIS — K921 Melena: Secondary | ICD-10-CM | POA: Diagnosis not present

## 2016-11-06 DIAGNOSIS — Z5321 Procedure and treatment not carried out due to patient leaving prior to being seen by health care provider: Secondary | ICD-10-CM | POA: Diagnosis not present

## 2016-11-06 DIAGNOSIS — R109 Unspecified abdominal pain: Secondary | ICD-10-CM | POA: Diagnosis not present

## 2016-11-06 DIAGNOSIS — R197 Diarrhea, unspecified: Secondary | ICD-10-CM | POA: Diagnosis not present

## 2016-11-06 LAB — CBG MONITORING, ED: Glucose-Capillary: 156 mg/dL — ABNORMAL HIGH (ref 65–99)

## 2016-11-06 MED ORDER — METRONIDAZOLE 500 MG PO TABS
500.0000 mg | ORAL_TABLET | Freq: Three times a day (TID) | ORAL | 0 refills | Status: DC
Start: 2016-11-06 — End: 2016-11-10

## 2016-11-06 NOTE — Telephone Encounter (Signed)
Patient states she is no better, asking for return phone call ASAP.

## 2016-11-06 NOTE — ED Notes (Signed)
Pt reports she feels as if her blood sugar is dropping.

## 2016-11-06 NOTE — Telephone Encounter (Signed)
Would require a stool sample for culture and Clostridium difficile toxin if possible.  If not going to the emergency room please send prescription for metronidazole 500 mg 3 times a day, however would prefer that she get evaluated

## 2016-11-06 NOTE — ED Triage Notes (Signed)
Patient reports that she has been having issues with diarrhea for several weeks now. Patient reports today she had dark blood in her today.  Patient states having fatigue.

## 2016-11-06 NOTE — ED Notes (Signed)
Pt's labs were not drawn in triage. Pt stated to go only in on spot, left AC and that her veins rolled a lot. Pt was stuck one time. Pt complained of turniquet to tight. Pt would not allow another stick in good veins in hand.

## 2016-11-06 NOTE — Telephone Encounter (Signed)
Called patient and she stated that her diarrhea is water and mucous, light yellow brown color. She is coughing and has uncontrollable diarrhea. Not able to eat and feels terrible. Please advise.

## 2016-11-06 NOTE — Telephone Encounter (Signed)
Needs to be evaluated in the emergency room

## 2016-11-06 NOTE — Telephone Encounter (Signed)
Patient called back to advise that she is going to the ed now. She also states that she found clumps of dark red blood in her stool. She just called to advise [place]

## 2016-11-06 NOTE — Telephone Encounter (Signed)
Spoke with the patient and she is very upset she is sick with all the same symptoms as below and the medication is not working to stop these symptoms- patient was given the advice but is unsure how she will get to the ER- I requested a call back to let me know if she is able to get there- patient did ask if there was anything else she can do ay home in case she can not get to the ER please advise

## 2016-11-06 NOTE — Telephone Encounter (Signed)
Spoke to the patient and she stated an understanding- she is really miserable and does not think she will make it to the hospital- patient requested I order the prescription and if it does not work than she we find a way to get to the ER-

## 2016-11-07 ENCOUNTER — Telehealth: Payer: Self-pay | Admitting: Endocrinology

## 2016-11-07 ENCOUNTER — Other Ambulatory Visit: Payer: Self-pay

## 2016-11-07 NOTE — Telephone Encounter (Signed)
Need to know exactly what symptoms she is having, Please see my note about starting Flagyl, she will have to get a container for stool connection, check with Kieth Brightly

## 2016-11-07 NOTE — Telephone Encounter (Signed)
Please call the triage nurse at [facility] ER that she will likely need admission for acute colitis, she had to wait several hours yesterday without being attended to

## 2016-11-07 NOTE — Telephone Encounter (Signed)
Pt is requesting a call back please she is still not feeling well

## 2016-11-07 NOTE — Telephone Encounter (Signed)
Spoke with the patient and she stated that she went to the ED yesterday and stayed for 7 hours and was never seen by anyone she is still feeling bad and has no way to get to the office to pick up a sterile container for the culture- patient wants to know if she has to be seen at ED or is there anything else we can do she is unable to keep anything in she is having constant diarrhea and has seen blood in her stools please advise the patient would like to talk to Dr. Dwyane Dee she states she just doesn't know what to do

## 2016-11-07 NOTE — Telephone Encounter (Signed)
Discussed with the patient on the phone: Since she is having constant diarrhea for the last week and now some blood with his stools she likely has some form of colitis and needs to be admitted.  She thinks that she cannot do it for a couple of days because of needing to take care of personal things.  She will try to make arrangements and go to the [facility] [place].  Advised her that she will need investigation and treatment including possibly intravenous antibiotics or fluids because of her significant and persistent symptoms.  At the minimum she needs to start taking Flagyl that was prescribed

## 2016-11-07 NOTE — Telephone Encounter (Signed)
Called patient but got busy signal will try again later

## 2016-11-07 NOTE — Telephone Encounter (Signed)
Tests needed: Stool culture and C. difficile toxin

## 2016-11-07 NOTE — Telephone Encounter (Signed)
The Flagyl was ordered yesterday just needed to know about the stool collection so that I can call the patient with instructions patient has limited transportation, is afraid to drive, and she does not have support at home I will advise the patient to pick up a stool container from the lab at her earliest convenience

## 2016-11-07 NOTE — Telephone Encounter (Signed)
Patient is heading to the hospital as soon as someone can pick up her cat. She said it will be tonight or early in the morning. She said she wants someone to let them know she is coming.

## 2016-11-07 NOTE — Telephone Encounter (Signed)
Spoke with melanie the triage nurse at [facility] and gave her Dr. Jodelle Green advice and stated he was requesting they be expecting this patient soon- gave the nurse the symptoms and she stated an understanding and will be checking to see when the patient arrives at the ED

## 2016-11-07 NOTE — Telephone Encounter (Signed)
Please check to see if she is still having problems with diarrhea.  Apparently nothing was checked at the ER, she may have a bacterial infection and needs to give [place] a stool culture and specimen for Clostridium difficile toxin.  Also diarrhea is unchanged she will need to be on Flagyl 500 mg 3 times a day for 7 days

## 2016-11-07 NOTE — Telephone Encounter (Signed)
Need to know what container I need to give the patient so that I can ask penny for it please advise

## 2016-11-08 ENCOUNTER — Encounter (HOSPITAL_COMMUNITY): Payer: Self-pay | Admitting: Emergency Medicine

## 2016-11-08 ENCOUNTER — Observation Stay (HOSPITAL_COMMUNITY): Payer: Medicare Other

## 2016-11-08 ENCOUNTER — Observation Stay (HOSPITAL_COMMUNITY)
Admission: EM | Admit: 2016-11-08 | Discharge: 2016-11-10 | Disposition: A | Discharge status: Home / Self Care | Payer: Medicare Other | Attending: Internal Medicine | Admitting: Internal Medicine

## 2016-11-08 DIAGNOSIS — M1990 Unspecified osteoarthritis, unspecified site: Secondary | ICD-10-CM | POA: Insufficient documentation

## 2016-11-08 DIAGNOSIS — I10 Essential (primary) hypertension: Secondary | ICD-10-CM | POA: Diagnosis present

## 2016-11-08 DIAGNOSIS — K589 Irritable bowel syndrome without diarrhea: Secondary | ICD-10-CM | POA: Diagnosis not present

## 2016-11-08 DIAGNOSIS — IMO0002 Reserved for concepts with insufficient information to code with codable children: Secondary | ICD-10-CM | POA: Diagnosis present

## 2016-11-08 DIAGNOSIS — R197 Diarrhea, unspecified: Principal | ICD-10-CM

## 2016-11-08 DIAGNOSIS — E785 Hyperlipidemia, unspecified: Secondary | ICD-10-CM | POA: Diagnosis not present

## 2016-11-08 DIAGNOSIS — E860 Dehydration: Secondary | ICD-10-CM | POA: Diagnosis not present

## 2016-11-08 DIAGNOSIS — K2970 Gastritis, unspecified, without bleeding: Secondary | ICD-10-CM | POA: Diagnosis not present

## 2016-11-08 DIAGNOSIS — H409 Unspecified glaucoma: Secondary | ICD-10-CM | POA: Insufficient documentation

## 2016-11-08 DIAGNOSIS — R1032 Left lower quadrant pain: Secondary | ICD-10-CM

## 2016-11-08 DIAGNOSIS — J9611 Chronic respiratory failure with hypoxia: Secondary | ICD-10-CM | POA: Diagnosis present

## 2016-11-08 DIAGNOSIS — D696 Thrombocytopenia, unspecified: Secondary | ICD-10-CM

## 2016-11-08 DIAGNOSIS — Z9981 Dependence on supplemental oxygen: Secondary | ICD-10-CM | POA: Insufficient documentation

## 2016-11-08 DIAGNOSIS — Z885 Allergy status to narcotic agent status: Secondary | ICD-10-CM | POA: Insufficient documentation

## 2016-11-08 DIAGNOSIS — K760 Fatty (change of) liver, not elsewhere classified: Secondary | ICD-10-CM | POA: Insufficient documentation

## 2016-11-08 DIAGNOSIS — Z7982 Long term (current) use of aspirin: Secondary | ICD-10-CM | POA: Diagnosis not present

## 2016-11-08 DIAGNOSIS — J449 Chronic obstructive pulmonary disease, unspecified: Secondary | ICD-10-CM | POA: Diagnosis not present

## 2016-11-08 DIAGNOSIS — E1142 Type 2 diabetes mellitus with diabetic polyneuropathy: Secondary | ICD-10-CM | POA: Diagnosis not present

## 2016-11-08 DIAGNOSIS — Z87891 Personal history of nicotine dependence: Secondary | ICD-10-CM | POA: Insufficient documentation

## 2016-11-08 DIAGNOSIS — I959 Hypotension, unspecified: Secondary | ICD-10-CM | POA: Diagnosis not present

## 2016-11-08 DIAGNOSIS — K5710 Diverticulosis of small intestine without perforation or abscess without bleeding: Secondary | ICD-10-CM | POA: Diagnosis not present

## 2016-11-08 DIAGNOSIS — J441 Chronic obstructive pulmonary disease with (acute) exacerbation: Secondary | ICD-10-CM | POA: Insufficient documentation

## 2016-11-08 DIAGNOSIS — E1165 Type 2 diabetes mellitus with hyperglycemia: Secondary | ICD-10-CM | POA: Diagnosis not present

## 2016-11-08 DIAGNOSIS — K219 Gastro-esophageal reflux disease without esophagitis: Secondary | ICD-10-CM | POA: Diagnosis not present

## 2016-11-08 DIAGNOSIS — Z794 Long term (current) use of insulin: Secondary | ICD-10-CM | POA: Diagnosis not present

## 2016-11-08 LAB — CBC WITH DIFFERENTIAL/PLATELET
Basophils Absolute: 0 K/uL (ref 0.0–0.1)
Basophils Relative: 0 %
Eosinophils Absolute: 0.1 K/uL (ref 0.0–0.7)
Eosinophils Relative: 3 %
HCT: 40.1 % (ref 36.0–46.0)
Hemoglobin: 12.6 g/dL (ref 12.0–15.0)
Lymphocytes Relative: 46 %
Lymphs Abs: 2.2 K/uL (ref 0.7–4.0)
MCH: 27 pg (ref 26.0–34.0)
MCHC: 31.4 g/dL (ref 30.0–36.0)
MCV: 86.1 fL (ref 78.0–100.0)
Monocytes Absolute: 0.2 K/uL (ref 0.1–1.0)
Monocytes Relative: 4 %
Neutro Abs: 2.2 K/uL (ref 1.7–7.7)
Neutrophils Relative %: 47 %
Platelets: 67 K/uL — ABNORMAL LOW (ref 150–400)
RBC: 4.66 MIL/uL (ref 3.87–5.11)
RDW: 14.9 % (ref 11.5–15.5)
WBC: 4.7 K/uL (ref 4.0–10.5)

## 2016-11-08 LAB — COMPREHENSIVE METABOLIC PANEL
ALBUMIN: 3.4 g/dL — AB (ref 3.5–5.0)
ALK PHOS: 39 U/L (ref 38–126)
ALT: 17 U/L (ref 14–54)
AST: 38 U/L (ref 15–41)
Anion gap: 8 (ref 5–15)
BUN: 9 mg/dL (ref 6–20)
CALCIUM: 9.1 mg/dL (ref 8.9–10.3)
CO2: 26 mmol/L (ref 22–32)
Chloride: 101 mmol/L (ref 101–111)
Creatinine, Ser: 0.72 mg/dL (ref 0.44–1.00)
GFR calc Af Amer: >60 mL/min (ref >60)
GFR calc non Af Amer: >60 mL/min (ref >60)
Glucose, Bld: 221 mg/dL — ABNORMAL HIGH (ref 65–99)
Potassium: 4 mmol/L (ref 3.5–5.1)
SODIUM: 135 mmol/L (ref 135–145)
Total Bilirubin: 0.7 mg/dL (ref 0.3–1.2)
Total Protein: 6.6 g/dL (ref 6.5–8.1)

## 2016-11-08 LAB — URINALYSIS, ROUTINE W REFLEX MICROSCOPIC
Bilirubin Urine: NEGATIVE
Glucose, UA: 150 mg/dL — AB
Hgb urine dipstick: NEGATIVE
Ketones, ur: NEGATIVE mg/dL
Leukocytes, UA: NEGATIVE
Nitrite: NEGATIVE
Protein, ur: NEGATIVE mg/dL
Specific Gravity, Urine: 1.018 (ref 1.005–1.030)
pH: 5 (ref 5.0–8.0)

## 2016-11-08 LAB — LIPASE, BLOOD: Lipase: 20 U/L (ref 11–51)

## 2016-11-08 LAB — C DIFFICILE QUICK SCREEN W PCR REFLEX
C Diff antigen: NEGATIVE
C Diff interpretation: NOT DETECTED
C Diff toxin: NEGATIVE

## 2016-11-08 LAB — VITAMIN B12: Vitamin B-12: 340 pg/mL (ref 180–914)

## 2016-11-08 LAB — GLUCOSE, CAPILLARY: GLUCOSE-CAPILLARY: 130 mg/dL — AB (ref 65–99)

## 2016-11-08 MED ORDER — IOPAMIDOL (ISOVUE-300) INJECTION 61%
INTRAVENOUS | Status: AC
  Administered 2016-11-08: 100 mL
  Filled 2016-11-08: qty 100

## 2016-11-08 MED ORDER — ONDANSETRON HCL 4 MG/2ML IJ SOLN: 4.0000 mg | Freq: Four times a day (QID) | INTRAMUSCULAR | Status: DC | PRN

## 2016-11-08 MED ORDER — IRBESARTAN 75 MG PO TABS
75.0000 mg | ORAL_TABLET | Freq: Every day | ORAL | Status: DC
  Administered 2016-11-08 – 2016-11-09 (×2): 75 mg via ORAL
  Filled 2016-11-08 (×2): qty 1

## 2016-11-08 MED ORDER — ACETAMINOPHEN 325 MG PO TABS
650.0000 mg | ORAL_TABLET | Freq: Four times a day (QID) | ORAL | Status: DC | PRN
  Administered 2016-11-09: 650 mg via ORAL
  Filled 2016-11-08: qty 2

## 2016-11-08 MED ORDER — SODIUM CHLORIDE 0.9 % IV SOLN
INTRAVENOUS | Status: DC
  Administered 2016-11-08 – 2016-11-10 (×3): via INTRAVENOUS

## 2016-11-08 MED ORDER — HYDROCORTISONE 2.5 % RE CREA
1.0000 "application " | TOPICAL_CREAM | Freq: Three times a day (TID) | RECTAL | Status: DC
  Administered 2016-11-09 – 2016-11-10 (×3): 1 via RECTAL
  Filled 2016-11-08: qty 28.35

## 2016-11-08 MED ORDER — ALPRAZOLAM 0.25 MG PO TABS
0.2500 mg | ORAL_TABLET | Freq: Every evening | ORAL | Status: DC | PRN
  Administered 2016-11-10: 0.25 mg via ORAL
  Filled 2016-11-08: qty 1

## 2016-11-08 MED ORDER — ONDANSETRON HCL 4 MG PO TABS
4.0000 mg | ORAL_TABLET | Freq: Four times a day (QID) | ORAL | Status: DC | PRN
  Administered 2016-11-09: 4 mg via ORAL
  Filled 2016-11-08: qty 1

## 2016-11-08 MED ORDER — INSULIN ASPART 100 UNIT/ML ~~LOC~~ SOLN: 0.0000 [IU] | Freq: Every day | SUBCUTANEOUS | Status: DC

## 2016-11-08 MED ORDER — INSULIN ASPART 100 UNIT/ML ~~LOC~~ SOLN
0.0000 [IU] | Freq: Three times a day (TID) | SUBCUTANEOUS | Status: DC
  Administered 2016-11-09: 3 [IU] via SUBCUTANEOUS
  Administered 2016-11-09 – 2016-11-10 (×3): 4 [IU] via SUBCUTANEOUS
  Administered 2016-11-10: 7 [IU] via SUBCUTANEOUS

## 2016-11-08 MED ORDER — INSULIN DETEMIR 100 UNIT/ML ~~LOC~~ SOLN
10.0000 [IU] | Freq: Every day | SUBCUTANEOUS | Status: DC
  Filled 2016-11-08 (×3): qty 0.1

## 2016-11-08 MED ORDER — SODIUM CHLORIDE 0.9 % IV BOLUS (SEPSIS)
1000.0000 mL | Freq: Once | INTRAVENOUS | Status: AC
  Administered 2016-11-08: 1000 mL via INTRAVENOUS

## 2016-11-08 MED ORDER — LEVALBUTEROL HCL 0.63 MG/3ML IN NEBU: 0.6300 mg | INHALATION_SOLUTION | Freq: Four times a day (QID) | RESPIRATORY_TRACT | Status: DC | PRN

## 2016-11-08 MED ORDER — METOPROLOL SUCCINATE ER 25 MG PO TB24
25.0000 mg | ORAL_TABLET | Freq: Every day | ORAL | Status: DC
  Administered 2016-11-09 – 2016-11-10 (×2): 25 mg via ORAL
  Filled 2016-11-08 (×2): qty 1

## 2016-11-08 MED ORDER — PRAVASTATIN SODIUM 20 MG PO TABS
20.0000 mg | ORAL_TABLET | Freq: Every day | ORAL | Status: DC
  Administered 2016-11-09: 20 mg via ORAL
  Filled 2016-11-08: qty 1

## 2016-11-08 MED ORDER — ONDANSETRON HCL 4 MG/2ML IJ SOLN
4.0000 mg | Freq: Once | INTRAMUSCULAR | Status: AC
  Administered 2016-11-08: 4 mg via INTRAVENOUS
  Filled 2016-11-08: qty 2

## 2016-11-08 MED ORDER — PANTOPRAZOLE SODIUM 40 MG PO TBEC
40.0000 mg | DELAYED_RELEASE_TABLET | Freq: Every day | ORAL | Status: DC
  Administered 2016-11-09 – 2016-11-10 (×2): 40 mg via ORAL
  Filled 2016-11-08 (×2): qty 1

## 2016-11-08 MED ORDER — ACETAMINOPHEN 650 MG RE SUPP: 650.0000 mg | Freq: Four times a day (QID) | RECTAL | Status: DC | PRN

## 2016-11-08 MED ORDER — UMECLIDINIUM-VILANTEROL 62.5-25 MCG/INH IN AEPB
1.0000 | INHALATION_SPRAY | Freq: Every day | RESPIRATORY_TRACT | Status: DC
  Filled 2016-11-08: qty 14

## 2016-11-08 NOTE — ED Notes (Signed)
MD gave patient crackers and peanut butter.

## 2016-11-08 NOTE — H&P (Signed)
History and Physical    [name] LYY:[id number] DOB: 1936/02/02 DOA: 11/08/2016  PCP: [name], MD   Patient coming from: Home  Chief Complaint: Diarrhea x 2.5 weeks  HPI: [name] is a 81 y.o. woman with a history of vertigo, HTN, DM complicated by neuropathy, GERD, COPD with chronic respiratory failure (3L [place] at baseline), and IBS who presents to the ED for evaluation of diarrhea for 2-3 weeks.  Stool were initially normal in color, but loose, then turned watery.  They are now semi-solid again and yellow. She reports passing two blood clots several days ago.  She has had nausea but no vomiting.  She has had intermittent LLQ pain with distention.  No fever.  Symptoms are not necessarily affected by eating.  ED Course: The patient had relative hypotension (systolic blood pressure in the 90's) that has improved with IV fluids.  She has not had any bowel movements since being in the ED.  Platelets are low at 67.  Hospitalist asked to place in observation.  CT A/P ordered at time of admission.  Review of Systems: As per HPI otherwise 10 systems reviewed and negative.   Past Medical History:  Diagnosis Date  . Allergic rhinitis, cause unspecified    Sinus CT Rec 12-23-2009  . Arthritis   . Benign paroxysmal positional vertigo 08/09/2012  . 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 Complication of anesthesia    takes along time to wake up  . Diabetes mellitus   . Diarrhea   . Diverticulosis   . Esophageal reflux   . Esophageal stricture   . GERD (gastroesophageal reflux disease)   . Glaucoma   . Hiatal hernia   . Hypertension   . Irritable bowel syndrome   . Neuropathy in diabetes ([place]) 08/09/2012  . Other chronic nonalcoholic liver disease   . Oxygen deficiency   . Sciatica   . Shingles 2010  . Shortness of breath   . Steatohepatitis     Past Surgical History:  Procedure Laterality Date  . APPENDECTOMY      . CARPAL TUNNEL RELEASE     right hand  . CATARACT EXTRACTION     Bilateral  . CHOLECYSTECTOMY OPEN  1978  . COLONOSCOPY  07-2001   mild diverticulosis  . ESOPHAGOGASTRODUODENOSCOPY  1275,17-00   H Hernia,es.stricture s/p dil 76F  . EYE SURGERY    . LIVER BIOPSY  08-1992  . PARATHYROID EXPLORATION    . TONSILLECTOMY AND ADENOIDECTOMY    . TOTAL ABDOMINAL HYSTERECTOMY    . ULNAR NERVE TRANSPOSITION  12/07/2011   Procedure: ULNAR NERVE DECOMPRESSION/TRANSPOSITION;this was cancelled-not done  Surgeon: Cammie Sickle., MD;  Location: [facility];  Service: Orthopedics;  Laterality: Right;  right ulnar nerve in situ decompression  . ULNAR TUNNEL RELEASE  03/07/2012   Procedure: CUBITAL TUNNEL RELEASE;  Surgeon: Roseanne Kaufman, MD;  Location: [facility];  Service: Orthopedics;  Laterality: Right;  ulnar nerve release at the elbow       reports that she quit smoking about 28 years ago. Her smoking use included Cigarettes. She has never used smokeless tobacco. She reports that she does not drink alcohol or use drugs.  Allergies  Allergen Reactions  . Chlordiazepoxide-Clidinium Other (See Comments)    Sleepy,weak  . Biaxin [Clarithromycin] Other (See Comments)    Foul taste, abd pain, diarrhea  . Tramadol Other (See Comments)  Caused tremors  . Codeine Other (See Comments)    REACTION: gi upset- only in high doses per pt.  Can tolerate cough syrup with codeine.     Family History  Problem Relation Age of Onset  . Heart disease Father   . Lung cancer Mother        small cell;Byssinosis  . Lung cancer Sister   . Liver cancer Sister        ? mets from another area of the body  . Diabetes Unknown        grandmother  . Stroke Maternal Grandfather      Prior to Admission medications   Medication Sig Start Date End Date Taking? Authorizing Provider  acetaminophen (TYLENOL) 500 MG tablet Take 1,000 mg by mouth every 6 (six) hours as needed for  headache (pain).    Yes [provider]  ALPRAZolam Duanne Moron) 0.5 MG tablet take 1/2 to 1 tablet by mouth twice a day Patient taking differently: Take 0.25 mg by mouth at bedtime as needed (foot pain/ neuropathy).  03/29/16  Yes [name], MD  aspirin 325 MG tablet Take 325 mg by mouth daily.     Yes [provider]  cholestyramine (QUESTRAN) 4 g packet take 1 packet once daily if needed for diarrhea 12/17/15  Yes [name], MD  dicyclomine (BENTYL) 10 MG capsule take 1 capsule by mouth once daily Patient taking differently: take 1 capsule by mouth daily as needed for stomach pain 08/18/16  Yes [name], MD  hydrocortisone (ANUSOL-HC) 2.5 % rectal cream Place 1 application rectally 3 (three) times daily. 10/31/16  Yes [name], MD  insulin aspart (NOVOLOG FLEXPEN) 100 UNIT/ML FlexPen Inject 30 units at breakfast- 24 units at lunch- and 30 units at supper Patient taking differently: Inject 20-34 Units into the skin See admin instructions. Inject 20-34 units subcutaneously three times daily - 20-24 units before breakfast, 22-24 units before lunch, 34 units before supper 07/03/16  Yes [name], MD  magic mouthwash SOLN Swish with 2 teaspoonfuls by mouth as needed Patient taking differently: Take by mouth See admin instructions. Swish and spit small amount two times daily as needed for mouth sore 08/29/16  Yes [name], MD  meclizine (ANTIVERT) 25 MG tablet take 1 tablet by mouth three times a day if needed for dizziness Patient taking differently: take 1 tablet by mouth three times a day if needed for dizziness/vertigo 05/26/15  Yes [name], MD  metoprolol succinate (TOPROL-XL) 25 MG 24 hr tablet take 1 tablet by mouth once daily 09/19/16  Yes [name], MD  omeprazole (PRILOSEC) 20 MG capsule Take 1 capsule (20 mg total) by mouth daily. 04/03/16  Yes [name], MD  OXYGEN Inhale 3 L into the lungs continuous.    Yes [provider]  pravastatin (PRAVACHOL) 20  MG tablet Take 1 tablet (20 mg total) by mouth daily. Patient taking differently: Take 20 mg by mouth daily with supper.  08/22/16  Yes [name], MD  Promethazine-Phenyleph-Codeine (PROMETHAZINE VC/CODEINE) 6.25-5-10 MG/5ML SYRP Take 5 mLs by mouth every 6 (six) hours as needed. Patient taking differently: Take 5 mLs by mouth every 6 (six) hours as needed (cough).  08/22/16  Yes Young, Kasandra Knudsen, MD  STIOLTO RESPIMAT 2.5-2.5 MCG/ACT AERS INHALE 2 PUFFS ONCE DAILY. 10/20/16  Yes Young, Clinton D, MD  TRESIBA FLEXTOUCH 200 UNIT/ML SOPN inject 94 units INTO THE SKIN DAILY Patient taking differently: Inject 68-72 Units into the skin See admin instructions.  Inject 68-72 units subcutaneously between 10am and 11am (72 units if morning CBG is >160) 07/17/16  Yes [name], MD  trimethoprim (TRIMPEX) 100 MG tablet Take 100 mg by mouth at bedtime. Reported on 09/06/2015 05/25/14  Yes [provider]  valsartan (DIOVAN) 160 MG tablet take 1 tablet by mouth once daily Patient taking differently: take 1/2 tablet (80 mg) by mouth daily at bedtime 10/23/15  Yes [name], MD  XOPENEX HFA 45 MCG/ACT inhaler inhale 2 puffs INTO THE LUNGS every 6 hours if needed for wheezing or shortness of breath 01/24/16  Yes Young, Tarri Fuller D, MD  B-D UF III MINI PEN NEEDLES 31G X 5 MM MISC USE 5 PER DAY TO INJECT INSULIN 04/15/15   [provider]  B-D ULTRAFINE III SHORT PEN 31G X 8 MM MISC TEST 3 TIMES A DAY 05/22/16   [name], MD  glucose blood (ACCU-CHEK AVIVA PLUS) test strip Use as instructed to check blood sugars 8 times per day dx code 250.02 12/25/13   [name], MD  Insulin Syringe-Needle U-100 (BD INSULIN SYRINGE ULTRAFINE) 31G X 15/64" 0.5 ML MISC Use 2 per day dx code E11.65 07/21/15   [name], MD  metroNIDAZOLE (FLAGYL) 500 MG tablet Take 1 tablet (500 mg total) by mouth 3 (three) times daily. 11/06/16   [name], MD    Physical Exam: Vitals:   11/08/16 1538 11/08/16 1617 11/08/16 1630  11/08/16 1632  BP:   (!) 147/90 (!) 147/90  Pulse: 94 93 96   Resp: (!) 21 19 (!) 23   SpO2: 96% 98% 99%       Constitutional: NAD, calm, comfortable, sitting on the edge of the bed, nontoxic appearing Vitals:   11/08/16 1538 11/08/16 1617 11/08/16 1630 11/08/16 1632  BP:   (!) 147/90 (!) 147/90  Pulse: 94 93 96   Resp: (!) 21 19 (!) 23   SpO2: 96% 98% 99%    Eyes: PERRL, lids and conjunctivae normal ENMT: Mucous membranes are moist. Posterior pharynx clear of any exudate or lesions. She is missing several teeth. Neck: normal appearance, supple, no masses Respiratory: clear to auscultation bilaterally, no wheezing, no crackles. Normal respiratory effort. No accessory muscle use.  Cardiovascular: Normal rate, regular rhythm, no murmurs / rubs / gallops. No extremity edema. 2+ pedal pulses.  GI: abdomen is distended but compressible.  No guarding.  No tenderness.  Bowel sounds are present. Musculoskeletal:  No joint deformity in upper and lower extremities. Good ROM, no contractures. Normal muscle tone.  Skin: no rashes, warm and dry Neurologic: No focal deficits. Psychiatric: Normal judgment and insight. Alert and oriented x 3. Normal mood.     Labs on Admission: I have personally reviewed following labs and imaging studies  CBC:  Recent Labs Lab 11/08/16 1015  WBC 4.7  NEUTROABS 2.2  HGB 12.6  HCT 40.1  MCV 86.1  PLT 67*   Basic Metabolic Panel:  Recent Labs Lab 11/08/16 1015  NA 135  K 4.0  CL 101  CO2 26  GLUCOSE 221*  BUN 9  CREATININE 0.72  CALCIUM 9.1   GFR: CrCl cannot be calculated (Unknown ideal weight.). Liver Function Tests:  Recent Labs Lab 11/08/16 1015  AST 38  ALT 17  ALKPHOS 39  BILITOT 0.7  PROT 6.6  ALBUMIN 3.4*    Recent Labs Lab 11/08/16 1015  LIPASE 20   CBG:  Recent Labs Lab 11/06/16 1749  GLUCAP 156*   Urine analysis:  Component Value Date/Time   COLORURINE YELLOW 11/08/2016 1435   APPEARANCEUR CLEAR  11/08/2016 1435   LABSPEC 1.018 11/08/2016 1435   PHURINE 5.0 11/08/2016 1435   GLUCOSEU 150 (A) 11/08/2016 1435   GLUCOSEU 100 (A) 10/14/2014 1109   HGBUR NEGATIVE 11/08/2016 1435   BILIRUBINUR NEGATIVE 11/08/2016 1435   BILIRUBINUR negative 03/21/2016 1406   KETONESUR NEGATIVE 11/08/2016 1435   PROTEINUR NEGATIVE 11/08/2016 1435   UROBILINOGEN 0.2 03/21/2016 1406   UROBILINOGEN 0.2 10/14/2014 1109   NITRITE NEGATIVE 11/08/2016 1435   LEUKOCYTESUR NEGATIVE 11/08/2016 1435    Radiological Exams on Admission: No results found.    Assessment/Plan Principal Problem:   Diarrhea Active Problems:   COPD mixed type (HCC)   Type II diabetes mellitus, uncontrolled ([place])   Essential hypertension, benign   Chronic respiratory failure with hypoxia (HCC)   Thrombocytopenia (H[facility])      Subacute diarrhea.  Of note, recent course of amoxicillin prescribed by her pulmonologist.   --CT A/P pending.  Infectious colitis is at the top of the differential. --GI pathogens panel and stool C diff PCR when patient able to give a sample --Defer empiric antibiotics for now --NS at 100cc/hr  Thrombocytopenia, new.  Could be related to acute infectious issues. --Repeat in AM --Hold full strength aspirin for now --Check B12, folate  COPD on home oxygen --Compensated --Continue home inhalers (of formulary substitutes)  Type 2 Diabetes --Hold home meds --Low dose levemir --SSI AC/HS  HTN --BB, ARB  GERD --PPI   DVT prophylaxis: Low risk, outpatient status. Code Status: FULL Family Communication: Patient alone in the ED at time of admission. Disposition Plan: Expect she will go home when ready for discharge. Consults called: NONE Admission status: Place in observation, med surg   TIME SPENT: 56 minutes   Eber Jones MD [facility] Pager [phone]  If 7PM-7AM, please contact night-coverage [email or web address] Password [facility]  11/08/2016, 4:49 PM

## 2016-11-08 NOTE — ED Notes (Signed)
Attempted report 

## 2016-11-08 NOTE — ED Provider Notes (Signed)
Emergency Department Provider Note   I have reviewed the triage vital signs and the nursing notes.   HISTORY  Chief Complaint Diarrhea  HPI [name] is a 81 y.o. female with PMH of GERD, diverticulosis, IBS, and HTN presents to the ED for evaluation of profuse, watery diarrhea with some mucous and questionable blood for the last 8-10 days. She describes it as yellow with some mucous. 2 days ago she passed 2-3 large blood clots but has had no bleeding since that time. Last night she had worsening diarrhea which kept her up throughout the night. She states that she is unable to hold the diarrhea in and many cases she has diarrhea on the floor or in her bed when she is unable to get to the toilet fast enough. Early in the course of illness she had diffuse abdominal cramping which has mostly resolved. She does report some nausea but denies vomiting. She is able to eat and drink but feels that she has almost instant diarrhea. No fevers or shaking chills. Early in June she was placed on antibiotics for COPD exacerbation and URI symptoms. No history of C. Difficile. She has been in conversations with her PCP, Dr. Dwyane Dee, who recommended Lomotil, Bentyl, and Immodium which she took with no relief. She went to [facility] days ago and left after waiting for 5 hours in the waiting room. When symptoms worsen last night she decided to present to the emergency department today. Since early June she has not been on any other antibiotics.    Past Medical History:  Diagnosis Date  . Allergic rhinitis, cause unspecified    Sinus CT Rec 12-23-2009  . Arthritis   . Benign paroxysmal positional vertigo 08/09/2012  . 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 Complication of anesthesia    takes along time to wake up  . Diabetes mellitus   . Diarrhea   . Diverticulosis   . Esophageal reflux   . Esophageal stricture   . GERD (gastroesophageal reflux  disease)   . Glaucoma   . Hiatal hernia   . Hypertension   . Irritable bowel syndrome   . Neuropathy in diabetes ([place]) 08/09/2012  . Other chronic nonalcoholic liver disease   . Oxygen deficiency   . Sciatica   . Shingles 2010  . Shortness of breath   . Steatohepatitis     Patient Active Problem List   Diagnosis Date Noted  . Diarrhea 11/08/2016  . Thrombocytopenia ([place]) 11/08/2016  . Degenerative lumbar spinal stenosis 10/27/2015  . Arthralgia 09/22/2015  . Chronic respiratory failure with hypoxia ([place]) 01/11/2015  . Diabetic polyneuropathy associated with type 2 diabetes mellitus ([place]) 11/16/2014  . Essential hypertension, benign 05/19/2013  . Vertigo 05/11/2013  . Dizziness 05/11/2013  . Tachycardia 05/11/2013  . Lower urinary tract infectious disease 05/11/2013  . Sinusitis 05/11/2013  . Other and unspecified hyperlipidemia 02/15/2013  . Type II diabetes mellitus, uncontrolled ([place]) 01/01/2013  . Benign paroxysmal positional vertigo 08/09/2012  . Type II or unspecified type diabetes mellitus with neurological manifestations, uncontrolled(250.62) 08/09/2012  . GLAUCOMA 03/16/2010  . HOARSENESS 03/16/2010  . COUGH 12/23/2009  . CHEST PAIN 12/23/2009  . MUSCULOSKELETAL PAIN 01/28/2009  . COPD mixed type ([place]) 01/15/2009  . Seasonal and perennial allergic rhinitis 11/23/2007  . CHRONIC RHINITIS 07/16/2007  . G E R D 05/29/2007  . I B S-DIARRHEAL PREDOMINATE 05/29/2007  . FATTY LIVER DISEASE 05/29/2007  Past Surgical History:  Procedure Laterality Date  . APPENDECTOMY    . CARPAL TUNNEL RELEASE     right hand  . CATARACT EXTRACTION     Bilateral  . CHOLECYSTECTOMY OPEN  1978  . COLONOSCOPY  07-2001   mild diverticulosis  . ESOPHAGOGASTRODUODENOSCOPY  [id number]   H Hernia,es.stricture s/p dil 53F  . EYE SURGERY    . LIVER BIOPSY  08-1992  . PARATHYROID EXPLORATION    . TONSILLECTOMY AND ADENOIDECTOMY    . TOTAL ABDOMINAL HYSTERECTOMY    . ULNAR NERVE  TRANSPOSITION  12/07/2011   Procedure: ULNAR NERVE DECOMPRESSION/TRANSPOSITION;this was cancelled-not done  Surgeon: Cammie Sickle., MD;  Location: [place];  Service: Orthopedics;  Laterality: Right;  right ulnar nerve in situ decompression  . ULNAR TUNNEL RELEASE  03/07/2012   Procedure: CUBITAL TUNNEL RELEASE;  Surgeon: Roseanne Kaufman, MD;  Location: [place];  Service: Orthopedics;  Laterality: Right;  ulnar nerve release at the elbow        Allergies Chlordiazepoxide-clidinium; Biaxin [clarithromycin]; Tramadol; and Codeine  Family History  Problem Relation Age of Onset  . Heart disease Father   . Lung cancer Mother        small cell;Byssinosis  . Lung cancer Sister   . Liver cancer Sister        ? mets from another area of the body  . Diabetes Unknown        grandmother  . Stroke Maternal Grandfather     Social History Social History  Substance Use Topics  . Smoking status: Former Smoker    Types: Cigarettes    Quit date: 05/08/1988  . Smokeless tobacco: Never Used  . Alcohol use No    Review of Systems  Constitutional: No fever/chills Eyes: No visual changes. ENT: No sore throat. Cardiovascular: Denies chest pain. Respiratory: Denies shortness of breath. Gastrointestinal: Positive cramping diffuse abdominal pain (resolving). Positive nausea, no vomiting. Positive profuse diarrhea.  No constipation. Genitourinary: Negative for dysuria. Musculoskeletal: Negative for back pain. Skin: Negative for rash. Neurological: Negative for headaches, focal weakness or numbness.  10-point ROS otherwise negative.  ____________________________________________   PHYSICAL EXAM:  VITAL SIGNS: Vitals:   11/08/16 2100 11/09/16 0557  BP: 116/65 (!) 117/57  Pulse: 93 92  Resp: 18 19  Temp:  97.6 F (36.4 C)    Constitutional: Alert and oriented. Well appearing and in no acute distress. Eyes: Conjunctivae are normal.  Head:  Atraumatic. Nose: No congestion/rhinnorhea. Mouth/Throat: Mucous membranes are very dry.  Neck: No stridor.   Cardiovascular: Sinus tachycardia. Good peripheral circulation. Grossly normal heart sounds.   Respiratory: Normal respiratory effort.  No retractions. Lungs CTAB. Gastrointestinal: Soft and nontender. No distention.  Musculoskeletal: No lower extremity tenderness nor edema. No gross deformities of extremities. Neurologic:  Normal speech and language. No gross focal neurologic deficits are appreciated.  Skin:  Skin is warm, dry and intact. No rash noted. Psychiatric: Mood and affect are normal. Speech and behavior are normal.  ____________________________________________   LABS (all labs ordered are listed, but only abnormal results are displayed)  Labs Reviewed  COMPREHENSIVE METABOLIC PANEL - Abnormal; Notable for the following:       Result Value   Glucose, Bld 221 (*)    Albumin 3.4 (*)    All other components within normal limits  CBC WITH DIFFERENTIAL/PLATELET - Abnormal; Notable for the following:    Platelets 67 (*)    All other components within normal limits  URINALYSIS, ROUTINE W REFLEX MICROSCOPIC - Abnormal; Notable for the following:    Glucose, UA 150 (*)    All other components within normal limits  GLUCOSE, CAPILLARY - Abnormal; Notable for the following:    Glucose-Capillary 130 (*)    All other components within normal limits  GLUCOSE, CAPILLARY - Abnormal; Notable for the following:    Glucose-Capillary 122 (*)    All other components within normal limits  GLUCOSE, CAPILLARY - Abnormal; Notable for the following:    Glucose-Capillary 178 (*)    All other components within normal limits  GLUCOSE, CAPILLARY - Abnormal; Notable for the following:    Glucose-Capillary 122 (*)    All other components within normal limits  C DIFFICILE QUICK SCREEN W PCR REFLEX  URINE CULTURE  GASTROINTESTINAL PANEL BY PCR, STOOL (REPLACES STOOL CULTURE)  LIPASE,  BLOOD  VITAMIN B12  FOLATE RBC  CBC  BASIC METABOLIC PANEL   ____________________________________________  RADIOLOGY  Ct Abdomen Pelvis W Contrast  Result Date: 11/08/2016 CLINICAL DATA:  Diarrhea for 2 weeks.  Left lower quadrant pain. EXAM: CT ABDOMEN AND PELVIS WITH CONTRAST TECHNIQUE: Multidetector CT imaging of the abdomen and pelvis was performed using the standard protocol following bolus administration of intravenous contrast. CONTRAST:  180m ISOVUE-300 IOPAMIDOL (ISOVUE-300) INJECTION 61% COMPARISON:  None. FINDINGS: Lower chest: Atelectasis and/ or scarring noted in the lower lungs. Coronary artery calcification is noted. Hepatobiliary: Unusual pattern of low attenuation identified within the liver parenchyma. Gallbladder surgically absent No intrahepatic or extrahepatic biliary dilation. Pancreas: Pancreas is diffusely atrophic. Spleen: Spleen is unremarkable. Adrenals/Urinary Tract: No adrenal nodule or mass. Kidneys unremarkable. No evidence for hydroureter. Tiny gas bubble identified in the bladder lumen. Stomach/Bowel: Stomach is nondistended. No gastric wall thickening. No evidence of outlet obstruction. Large duodenal diverticulum evident although this could be a markedly patulous duodenum bulb. Duodenum otherwise unremarkable. No small bowel wall thickening. No small bowel dilatation. The terminal ileum is normal. The appendix is not visualized, but there is no edema or inflammation in the region of the cecum. No gross colonic mass. No colonic wall thickening. No substantial diverticular change. Vascular/Lymphatic: There is abdominal aortic atherosclerosis without aneurysm. There is no gastrohepatic or hepatoduodenal ligament lymphadenopathy. No intraperitoneal or retroperitoneal lymphadenopathy. No pelvic sidewall lymphadenopathy. Reproductive: Uterus surgically absent.  There is no adnexal mass. Other: No intraperitoneal free fluid. Musculoskeletal: Bone windows reveal no worrisome  lytic or sclerotic osseous lesions. IMPRESSION: 1. Unusual enhancement pattern in the liver. Liver contour appears somewhat nodular along the left liver raising the question of cirrhosis. As such, unusual enhancement pattern may be related to underlying areas of fibrosis. Alternatively features may be related to an atypical appearance of fatty deposition within the parenchyma. Follow-up abdominal MRI without and with contrast is recommended to further evaluate. 2. Small gas bubble in the urinary bladder. If the patient has had no recent urinary instrumentation, bladder infection would be a consideration. 3. Coronary artery and thoracoabdominal aortic atherosclerosis. 4. No findings in the colon to explain the patient's history of diarrhea and rectal bleeding. Electronically Signed   By: EMisty StanleyM.D.   On: 11/08/2016 18:04    ____________________________________________   PROCEDURES  Procedure(s) performed:   Procedures  None ____________________________________________   INITIAL IMPRESSION / ASSESSMENT AND PLAN / ED COURSE  Pertinent labs & imaging results that were available during my care of the patient were reviewed by me and considered in my medical decision making (see chart for details).  Patient presents to  the emergency department for evaluation of profuse diarrhea over the past 8-10 days. She saw blood clots in the stool 2 days ago but none since. Describes it as yellow in color. No focal tenderness on abdominal exam. The patient has very dry mucous membranes and has tachycardia on arrival to the emergency department. She is clinically very dehydrated. My suspicion for bacterial enteritis has increased especially in the setting of antibiotic use early last month. Patient also with history of IBS but this is much different than her typical diarrhea symptoms. Plan for IVF, labs, stool culture, and c. Diff screen.   11:30 AM Labs reviewed with no significant abnormalities. Patient  remains slightly tachycardic but this is improving with IV fluids. No diarrhea since arrival to the emergency department the patient states that after eating she typically has severe diarrhea so will PO challenge.   04:08 PM No diarrhea since arrival in the ED. HR down-trending with IVF. No UTI. No fever. Patient reports continued generalized weakness worsening with ambulation. Given patient's age and clinical dehydration will discuss with the hospitalist regarding admission for continued IVF and stool culture to r/o c. Diff.   Discussed patient's case with Hospitalist, Dr. Eulas Post. Patient and family (if present) updated with plan. Care transferred to Hospitalist service.  I reviewed all nursing notes, vitals, pertinent old records, EKGs, labs, imaging (as available).  ____________________________________________  FINAL CLINICAL IMPRESSION(S) / ED DIAGNOSES  Final diagnoses:  Dehydration  Diarrhea of presumed infectious origin     MEDICATIONS GIVEN DURING THIS VISIT:  Medications  hydrocortisone (ANUSOL-HC) 2.5 % rectal cream 1 application (1 application Rectal Not Given 11/08/16 1900)  metoprolol succinate (TOPROL-XL) 24 hr tablet 25 mg (not administered)  pravastatin (PRAVACHOL) tablet 20 mg (20 mg Oral Not Given 11/08/16 1800)  pantoprazole (PROTONIX) EC tablet 40 mg (not administered)  ALPRAZolam (XANAX) tablet 0.25 mg (not administered)  irbesartan (AVAPRO) tablet 75 mg (75 mg Oral Given 11/08/16 2133)  0.9 %  sodium chloride infusion ( Intravenous Stopped 11/09/16 0149)  acetaminophen (TYLENOL) tablet 650 mg (650 mg Oral Given 11/09/16 0238)    Or  acetaminophen (TYLENOL) suppository 650 mg ( Rectal See Alternative 11/09/16 0238)  ondansetron (ZOFRAN) tablet 4 mg (not administered)    Or  ondansetron (ZOFRAN) injection 4 mg (not administered)  insulin aspart (novoLOG) injection 0-20 Units (0 Units Subcutaneous Not Given 11/08/16 1833)  insulin aspart (novoLOG) injection 0-5 Units (0 Units  Subcutaneous Not Given 11/08/16 2145)  insulin detemir (LEVEMIR) injection 10 Units (10 Units Subcutaneous Not Given 11/08/16 2200)  levalbuterol (XOPENEX) nebulizer solution 0.63 mg (not administered)  umeclidinium-vilanterol (ANORO ELLIPTA) 62.5-25 MCG/INH 1 puff (1 puff Inhalation Not Given 11/08/16 1734)  loperamide (IMODIUM) capsule 2 mg (not administered)  sodium chloride 0.9 % bolus 1,000 mL (0 mLs Intravenous Stopped 11/08/16 1633)  ondansetron (ZOFRAN) injection 4 mg (4 mg Intravenous Given 11/08/16 1012)  iopamidol (ISOVUE-300) 61 % injection (100 mLs  Contrast Given 11/08/16 1727)     NEW OUTPATIENT MEDICATIONS STARTED DURING THIS VISIT:  None   Note:  This document was prepared using Dragon voice recognition software and may include unintentional dictation errors.  Nanda Quinton, MD Emergency Medicine    Edman Lipsey, [facility], MD 11/09/16 [phone]

## 2016-11-08 NOTE — ED Notes (Signed)
Admitting at bedside,

## 2016-11-08 NOTE — ED Notes (Signed)
Patient in CT

## 2016-11-08 NOTE — ED Triage Notes (Signed)
Patient presents today with complaints of Diarrhea x 2weeks. Patient reports saw her PCP placed on amitotics but is not working. Patient reports she notice she had dark red blood in her stool yesterday. Patient alert and oriented x4 . Patient ambulated to the bed. Patient CBG with  EMS 291. EMS reports she then took 24 units on Novolog. Patient denies any n/V  Chest pain or SOB on arrival.

## 2016-11-08 NOTE — ED Notes (Signed)
PT did well ambulating to bed side table and back to bed. Pt stated that she feels unsteady on her feet sometime. Pt also stated that she has a walker but don't use it because it is to much to handle since she has one to two steps in home.

## 2016-11-08 NOTE — Progress Notes (Signed)
MEDICATION RELATED CONSULT NOTE - INITIAL   Pharmacy Consult for Stiolto substitution Indication: COPD  Assessment: Stiolto contains Tiotropium and olodaterol; which is substitutable with Anoro (Umeclidinium and vilanterol) our formulary.   Plan:  Order Celedonio Miyamoto per formulary  Dierdre Harness, [place], PharmD Clinical Pharmacy Resident [phone] (Pager) 11/08/2016 4:58 PM

## 2016-11-09 ENCOUNTER — Encounter (HOSPITAL_COMMUNITY): Payer: Self-pay | Admitting: *Deleted

## 2016-11-09 DIAGNOSIS — J9611 Chronic respiratory failure with hypoxia: Secondary | ICD-10-CM | POA: Diagnosis not present

## 2016-11-09 DIAGNOSIS — R197 Diarrhea, unspecified: Principal | ICD-10-CM

## 2016-11-09 DIAGNOSIS — D696 Thrombocytopenia, unspecified: Secondary | ICD-10-CM

## 2016-11-09 DIAGNOSIS — K922 Gastrointestinal hemorrhage, unspecified: Secondary | ICD-10-CM | POA: Diagnosis not present

## 2016-11-09 DIAGNOSIS — J449 Chronic obstructive pulmonary disease, unspecified: Secondary | ICD-10-CM | POA: Diagnosis not present

## 2016-11-09 LAB — OCCULT BLOOD X 1 CARD TO LAB, STOOL: FECAL OCCULT BLD: NEGATIVE

## 2016-11-09 LAB — GASTROINTESTINAL PANEL BY PCR, STOOL (REPLACES STOOL CULTURE)
ASTROVIRUS: NOT DETECTED
Adenovirus F40/41: NOT DETECTED
Campylobacter species: NOT DETECTED
Cryptosporidium: NOT DETECTED
Cyclospora cayetanensis: NOT DETECTED
ENTAMOEBA HISTOLYTICA: NOT DETECTED
Enteroaggregative E coli (EAEC): NOT DETECTED
Enteropathogenic E coli (EPEC): NOT DETECTED
Enterotoxigenic E coli (ETEC): NOT DETECTED
Giardia lamblia: NOT DETECTED
NOROVIRUS GI/GII: NOT DETECTED
Plesimonas shigelloides: NOT DETECTED
Rotavirus A: NOT DETECTED
SAPOVIRUS (I, II, IV, AND V): NOT DETECTED
SHIGA LIKE TOXIN PRODUCING E COLI (STEC): NOT DETECTED
Salmonella species: NOT DETECTED
Shigella/Enteroinvasive E coli (EIEC): NOT DETECTED
VIBRIO CHOLERAE: NOT DETECTED
Vibrio species: NOT DETECTED
Yersinia enterocolitica: NOT DETECTED

## 2016-11-09 LAB — BASIC METABOLIC PANEL
ANION GAP: 9 (ref 5–15)
CALCIUM: 8.9 mg/dL (ref 8.9–10.3)
CO2: 27 mmol/L (ref 22–32)
Chloride: 103 mmol/L (ref 101–111)
Creatinine, Ser: 0.58 mg/dL (ref 0.44–1.00)
GFR calc Af Amer: >60 mL/min (ref >60)
Glucose, Bld: 139 mg/dL — ABNORMAL HIGH (ref 65–99)
POTASSIUM: 4.1 mmol/L (ref 3.5–5.1)
SODIUM: 139 mmol/L (ref 135–145)

## 2016-11-09 LAB — CBC
HCT: 41.3 % (ref 36.0–46.0)
Hemoglobin: 12.7 g/dL (ref 12.0–15.0)
MCH: 26.9 pg (ref 26.0–34.0)
MCHC: 30.8 g/dL (ref 30.0–36.0)
MCV: 87.5 fL (ref 78.0–100.0)
PLATELETS: 180 10*3/uL (ref 150–400)
RBC: 4.72 MIL/uL (ref 3.87–5.11)
RDW: 14.9 % (ref 11.5–15.5)
WBC: 7.5 10*3/uL (ref 4.0–10.5)

## 2016-11-09 LAB — GLUCOSE, CAPILLARY
GLUCOSE-CAPILLARY: 122 mg/dL — AB (ref 65–99)
GLUCOSE-CAPILLARY: 122 mg/dL — AB (ref 65–99)
Glucose-Capillary: 178 mg/dL — ABNORMAL HIGH (ref 65–99)
Glucose-Capillary: 189 mg/dL — ABNORMAL HIGH (ref 65–99)
Glucose-Capillary: 181 mg/dL — ABNORMAL HIGH (ref 65–99)
Glucose-Capillary: 168 mg/dL — ABNORMAL HIGH (ref 65–99)

## 2016-11-09 LAB — URINE CULTURE
Culture: NO GROWTH
SPECIAL REQUESTS: NORMAL

## 2016-11-09 LAB — FOLATE RBC
FOLATE, HEMOLYSATE: 302.4 ng/mL
Folate, RBC: 758 ng/mL (ref >498)
Hematocrit: 39.9 % (ref 34.0–46.6)

## 2016-11-09 MED ORDER — COLESTIPOL HCL 1 G PO TABS
1.0000 g | ORAL_TABLET | Freq: Once | ORAL | Status: AC
  Administered 2016-11-09: 1 g via ORAL
  Filled 2016-11-09: qty 1

## 2016-11-09 MED ORDER — COLESTIPOL HCL 1 G PO TABS
2.0000 g | ORAL_TABLET | Freq: Every day | ORAL | Status: DC
  Filled 2016-11-09: qty 2

## 2016-11-09 MED ORDER — LOPERAMIDE HCL 2 MG PO CAPS
2.0000 mg | ORAL_CAPSULE | ORAL | Status: DC | PRN
  Administered 2016-11-09: 2 mg via ORAL
  Filled 2016-11-09: qty 1

## 2016-11-09 MED ORDER — ENOXAPARIN SODIUM 30 MG/0.3ML ~~LOC~~ SOLN
30.0000 mg | SUBCUTANEOUS | Status: DC
  Filled 2016-11-09: qty 0.3

## 2016-11-09 NOTE — Care Management Note (Addendum)
Case Management Note  Patient Details  Name: [name] MRN: [id number] Date of Birth: Mar 20, 1936  Subjective/Objective:              Pt presents with c/o diarrhea x2.5 weeks/ LLQ abd pain/ distention.      PCP: [name]  Action/Plan: CT ABD/Pelvis, MIVF @ 100 cc/hr.Marland KitchenMarland KitchenMarland KitchenMarland Kitchenplan is to d/c to home when medically stable. CM to f/u with disposition needs.  Expected Discharge Date:                  Expected Discharge Plan:  Home/Self Care  In-House Referral:     Discharge planning Services  CM Consult  Post Acute Care Choice:    Choice offered to:     DME Arranged:    DME Agency:     HH Arranged:    HH Agency:     Status of Service:  In process, will continue to follow  If discussed at Long Length of Stay Meetings, dates discussed:    Additional Comments:  Sharin Mons, RN 11/09/2016, 9:17 AM

## 2016-11-09 NOTE — Consult Note (Signed)
Referring Provider:  Dr. Burnis Medin Primary Care Physician:  [name], MD Primary Gastroenterologist: Althia Forts  Reason for Consultation:  GI bleed/diarrhea  HPI: [name] is a 81 y.o. female past medical history of diabetes with neuropathy, chronic respiratory failure with COPD on 3 L oxygen at baseline admitted to the hospital for further evaluation of diarrhea. GI is consulted for further evaluation.  Patient seen and examined the bedside. According to patient she has been having diarrhea for last many years. She started noticing worsening diarrhea around 1 month ago.describes it as 5-6 loose stools per day which mostly happens after meals. She is complaining of bilateral lower quadrant abdominal discomfort. Complaining of nausea but denied vomiting. Denied any dysphagia. Had 1 episode of maroon-colored clots with streaks of blood in the stool which has resolved now. She is having yellow/brown color stool now.   Previous GI workup --------------------------- -  Colonoscopy  in 2012 by Dr. [name]  for evaluation of chronic diarrhea. Biopsies were negative for microscopic colitis.  - EGD also in 2012- nonobstructing lower esophagus stricture. Dilatation with [place]. Small hiatal hernia. Biopsies negative for celiac disease  Past Medical History:  Diagnosis Date  . Allergic rhinitis, cause unspecified    Sinus CT Rec 12-23-2009  . Arthritis   . Benign paroxysmal positional vertigo 08/09/2012  . 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 Complication of anesthesia    takes along time to wake up  . Diabetes mellitus   . Diarrhea   . Diverticulosis   . Esophageal reflux   . Esophageal stricture   . GERD (gastroesophageal reflux disease)   . Glaucoma   . Hiatal hernia   . Hypertension   . Irritable bowel syndrome   . Neuropathy in diabetes ([place]) 08/09/2012  . Other chronic nonalcoholic liver disease   . Oxygen  deficiency   . Sciatica   . Shingles 2010  . Shortness of breath   . Steatohepatitis     Past Surgical History:  Procedure Laterality Date  . APPENDECTOMY    . CARPAL TUNNEL RELEASE     right hand  . CATARACT EXTRACTION     Bilateral  . CHOLECYSTECTOMY OPEN  1978  . COLONOSCOPY  07-2001   mild diverticulosis  . ESOPHAGOGASTRODUODENOSCOPY  1324,40-10   H Hernia,es.stricture s/p dil 60F  . EYE SURGERY    . LIVER BIOPSY  08-1992  . PARATHYROID EXPLORATION    . TONSILLECTOMY AND ADENOIDECTOMY    . TOTAL ABDOMINAL HYSTERECTOMY    . ULNAR NERVE TRANSPOSITION  12/07/2011   Procedure: ULNAR NERVE DECOMPRESSION/TRANSPOSITION;this was cancelled-not done  Surgeon: Cammie Sickle., MD;  Location: [facility];  Service: Orthopedics;  Laterality: Right;  right ulnar nerve in situ decompression  . ULNAR TUNNEL RELEASE  03/07/2012   Procedure: CUBITAL TUNNEL RELEASE;  Surgeon: Roseanne Kaufman, MD;  Location: [facility];  Service: Orthopedics;  Laterality: Right;  ulnar nerve release at the elbow      Prior to Admission medications   Medication Sig Start Date End Date Taking? Authorizing Provider  acetaminophen (TYLENOL) 500 MG tablet Take 1,000 mg by mouth every 6 (six) hours as needed for headache (pain).    Yes [provider]  ALPRAZolam Duanne Moron) 0.5 MG tablet take 1/2 to 1 tablet by mouth twice a day Patient taking differently: Take 0.25 mg by mouth at bedtime as needed (foot pain/ neuropathy).  03/29/16  Yes [name], MD  aspirin 325 MG tablet Take 325 mg by mouth daily.     Yes [provider]  cholestyramine (QUESTRAN) 4 g packet take 1 packet once daily if needed for diarrhea 12/17/15  Yes [name], MD  dicyclomine (BENTYL) 10 MG capsule take 1 capsule by mouth once daily Patient taking differently: take 1 capsule by mouth daily as needed for stomach pain 08/18/16  Yes [name], MD  hydrocortisone (ANUSOL-HC) 2.5 % rectal cream  Place 1 application rectally 3 (three) times daily. 10/31/16  Yes [name], MD  insulin aspart (NOVOLOG FLEXPEN) 100 UNIT/ML FlexPen Inject 30 units at breakfast- 24 units at lunch- and 30 units at supper Patient taking differently: Inject 20-34 Units into the skin See admin instructions. Inject 20-34 units subcutaneously three times daily - 20-24 units before breakfast, 22-24 units before lunch, 34 units before supper 07/03/16  Yes [name], MD  magic mouthwash SOLN Swish with 2 teaspoonfuls by mouth as needed Patient taking differently: Take by mouth See admin instructions. Swish and spit small amount two times daily as needed for mouth sore 08/29/16  Yes [name], MD  meclizine (ANTIVERT) 25 MG tablet take 1 tablet by mouth three times a day if needed for dizziness Patient taking differently: take 1 tablet by mouth three times a day if needed for dizziness/vertigo 05/26/15  Yes [name], MD  metoprolol succinate (TOPROL-XL) 25 MG 24 hr tablet take 1 tablet by mouth once daily 09/19/16  Yes [name], MD  omeprazole (PRILOSEC) 20 MG capsule Take 1 capsule (20 mg total) by mouth daily. 04/03/16  Yes [name], MD  OXYGEN Inhale 3 L into the lungs continuous.    Yes [provider]  pravastatin (PRAVACHOL) 20 MG tablet Take 1 tablet (20 mg total) by mouth daily. Patient taking differently: Take 20 mg by mouth daily with supper.  08/22/16  Yes [name], MD  Promethazine-Phenyleph-Codeine (PROMETHAZINE VC/CODEINE) 6.25-5-10 MG/5ML SYRP Take 5 mLs by mouth every 6 (six) hours as needed. Patient taking differently: Take 5 mLs by mouth every 6 (six) hours as needed (cough).  08/22/16  Yes Young, Kasandra Knudsen, MD  STIOLTO RESPIMAT 2.5-2.5 MCG/ACT AERS INHALE 2 PUFFS ONCE DAILY. 10/20/16  Yes Young, Clinton D, MD  TRESIBA FLEXTOUCH 200 UNIT/ML SOPN inject 94 units INTO THE SKIN DAILY Patient taking differently: Inject 68-72 Units into the skin See admin instructions. Inject 68-72 units  subcutaneously between 10am and 11am (72 units if morning CBG is >160) 07/17/16  Yes [name], MD  trimethoprim (TRIMPEX) 100 MG tablet Take 100 mg by mouth at bedtime. Reported on 09/06/2015 05/25/14  Yes [provider]  valsartan (DIOVAN) 160 MG tablet take 1 tablet by mouth once daily Patient taking differently: take 1/2 tablet (80 mg) by mouth daily at bedtime 10/23/15  Yes [name], MD  XOPENEX HFA 45 MCG/ACT inhaler inhale 2 puffs INTO THE LUNGS every 6 hours if needed for wheezing or shortness of breath 01/24/16  Yes Young, Tarri Fuller D, MD  B-D UF III MINI PEN NEEDLES 31G X 5 MM MISC USE 5 PER DAY TO INJECT INSULIN 04/15/15   [provider]  B-D ULTRAFINE III SHORT PEN 31G X 8 MM MISC TEST 3 TIMES A DAY 05/22/16   [name], MD  glucose blood (ACCU-CHEK AVIVA PLUS) test strip Use as instructed to check blood sugars 8 times per day dx code 250.02 12/25/13   [name], MD  Insulin Syringe-Needle  U-100 (BD INSULIN SYRINGE ULTRAFINE) 31G X 15/64" 0.5 ML MISC Use 2 per day dx code E11.65 07/21/15   [name], MD  metroNIDAZOLE (FLAGYL) 500 MG tablet Take 1 tablet (500 mg total) by mouth 3 (three) times daily. 11/06/16   [name], MD    Scheduled Meds: . hydrocortisone  1 application Rectal TID  . insulin aspart  0-20 Units Subcutaneous TID WC  . insulin aspart  0-5 Units Subcutaneous QHS  . insulin detemir  10 Units Subcutaneous QHS  . irbesartan  75 mg Oral QHS  . metoprolol succinate  25 mg Oral Daily  . pantoprazole  40 mg Oral Daily  . pravastatin  20 mg Oral Q supper  . umeclidinium-vilanterol  1 puff Inhalation Daily   Continuous Infusions: . sodium chloride Stopped (11/09/16 0149)   PRN Meds:.acetaminophen **OR** acetaminophen, ALPRAZolam, levalbuterol, loperamide, ondansetron **OR** ondansetron (ZOFRAN) IV  Allergies as of 11/08/2016 - Review Complete 11/08/2016  Allergen Reaction Noted  . Chlordiazepoxide-clidinium Other (See Comments) 02/27/2011  .  Biaxin [clarithromycin] Other (See Comments) 09/03/2012  . Tramadol Other (See Comments) 09/28/2010  . Codeine Other (See Comments) 03/16/2010    Family History  Problem Relation Age of Onset  . Heart disease Father   . Lung cancer Mother        small cell;Byssinosis  . Lung cancer Sister   . Liver cancer Sister        ? mets from another area of the body  . Diabetes Unknown        grandmother  . Stroke Maternal Grandfather     Social History   Social History  . Marital status: Single    Spouse name: N/A  . Number of children: N/A  . Years of education: N/A   Occupational History  . Retired     Social History Main Topics  . Smoking status: Former Smoker    Types: Cigarettes    Quit date: 05/08/1988  . Smokeless tobacco: Never Used  . Alcohol use No  . Drug use: No  . Sexual activity: No   Other Topics Concern  . Not on file   Social History Narrative  . No narrative on file    Review of Systems: Review of Systems  Constitutional: Negative for chills, fever, malaise/fatigue and weight loss.  HENT: Negative for ear discharge, ear pain, hearing loss and tinnitus.   Eyes: Negative for blurred vision and double vision.  Respiratory: Positive for shortness of breath. Negative for cough and hemoptysis.   Cardiovascular: Negative for chest pain and palpitations.  Gastrointestinal: Positive for abdominal pain, blood in stool, diarrhea and nausea. Negative for melena and vomiting.  Genitourinary: Positive for frequency. Negative for dysuria.  Musculoskeletal: Positive for back pain and joint pain. Negative for myalgias.  Skin: Negative for rash.  Neurological: Negative for focal weakness and seizures.  Endo/Heme/Allergies: Does not bruise/bleed easily.  Psychiatric/Behavioral: Negative for hallucinations and suicidal ideas.    Physical Exam: Vital signs: Vitals:   11/08/16 2100 11/09/16 0557  BP: 116/65 (!) 117/57  Pulse: 93 92  Resp: 18 19  Temp:  97.6 F (36.4  C)   Last BM Date: 11/08/16 General:   Alert,  Well-developed, well-nourished, pleasant and cooperative in NAD HEENT : normocephalic, atraumatic, extraocular movement intact. No scleral icterus. Lungs:  decreased breath sounds bilaterally No acute distress. Heart:  Regular rate and rhythm; no murmurs, clicks, rubs,  or gallops. Abdomen: soft, mild lower quadrant discomfort on palpation, non tender, no  peritoneal signs, scar marks at right upper quadrant as well as central midline noted.bowel sounds present LE: no edema. Pulses present Rectal:  Deferred  GI:  Lab Results:  Recent Labs  11/08/16 1015 11/09/16 0800  WBC 4.7 7.5  HGB 12.6 12.7  HCT 40.1 41.3  PLT 67* 180   BMET  Recent Labs  11/08/16 1015 11/09/16 0800  NA 135 139  K 4.0 4.1  CL 101 103  CO2 26 27  GLUCOSE 221* 139*  BUN 9 <5*  CREATININE 0.72 0.58  CALCIUM 9.1 8.9   LFT  Recent Labs  11/08/16 1015  PROT 6.6  ALBUMIN 3.4*  AST 38  ALT 17  ALKPHOS 39  BILITOT 0.7   PT/INR No results for input(s): LABPROT, INR in the last 72 hours.   Studies/Results: Ct Abdomen Pelvis W Contrast  Result Date: 11/08/2016 CLINICAL DATA:  Diarrhea for 2 weeks.  Left lower quadrant pain. EXAM: CT ABDOMEN AND PELVIS WITH CONTRAST TECHNIQUE: Multidetector CT imaging of the abdomen and pelvis was performed using the standard protocol following bolus administration of intravenous contrast. CONTRAST:  154m ISOVUE-300 IOPAMIDOL (ISOVUE-300) INJECTION 61% COMPARISON:  None. FINDINGS: Lower chest: Atelectasis and/ or scarring noted in the lower lungs. Coronary artery calcification is noted. Hepatobiliary: Unusual pattern of low attenuation identified within the liver parenchyma. Gallbladder surgically absent No intrahepatic or extrahepatic biliary dilation. Pancreas: Pancreas is diffusely atrophic. Spleen: Spleen is unremarkable. Adrenals/Urinary Tract: No adrenal nodule or mass. Kidneys unremarkable. No evidence for  hydroureter. Tiny gas bubble identified in the bladder lumen. Stomach/Bowel: Stomach is nondistended. No gastric wall thickening. No evidence of outlet obstruction. Large duodenal diverticulum evident although this could be a markedly patulous duodenum bulb. Duodenum otherwise unremarkable. No small bowel wall thickening. No small bowel dilatation. The terminal ileum is normal. The appendix is not visualized, but there is no edema or inflammation in the region of the cecum. No gross colonic mass. No colonic wall thickening. No substantial diverticular change. Vascular/Lymphatic: There is abdominal aortic atherosclerosis without aneurysm. There is no gastrohepatic or hepatoduodenal ligament lymphadenopathy. No intraperitoneal or retroperitoneal lymphadenopathy. No pelvic sidewall lymphadenopathy. Reproductive: Uterus surgically absent.  There is no adnexal mass. Other: No intraperitoneal free fluid. Musculoskeletal: Bone windows reveal no worrisome lytic or sclerotic osseous lesions. IMPRESSION: 1. Unusual enhancement pattern in the liver. Liver contour appears somewhat nodular along the left liver raising the question of cirrhosis. As such, unusual enhancement pattern may be related to underlying areas of fibrosis. Alternatively features may be related to an atypical appearance of fatty deposition within the parenchyma. Follow-up abdominal MRI without and with contrast is recommended to further evaluate. 2. Small gas bubble in the urinary bladder. If the patient has had no recent urinary instrumentation, bladder infection would be a consideration. 3. Coronary artery and thoracoabdominal aortic atherosclerosis. 4. No findings in the colon to explain the patient's history of diarrhea and rectal bleeding. Electronically Signed   By: EMisty StanleyM.D.   On: 11/08/2016 18:04    Impression/Plan: - Acute on chronic diarrhea.c. Difficile negative. GI pathogen panel pending. ?? Bile salt diarrhea versus diabetic  diarrhea. - One episode of possible rectal bleed. Resolved. Now having brown stools. Normal CBC. - abnormal CT scan showing unusual enhancement pattern as well as questionable nodular counter concerning for cirrhosis. - long-standing diabetes - H/O  esophageal stricture. Last dilatation was in 2012. Denied any further dysphagia  recommendations ------------------------- - Follow GI pathogen panel. - trial of Colestid for possible bile  salt diarrhea. Side effects discussed with the patient. - offered colonoscopy for further evaluation. Patient declined.Risk of missing significant findings D/W patient.  - Also offered MRI for further evaluation of liver but she also declined. According to patient she had abnormal appearance of the liver since last 20 years. She had liver biopsy done several years ago which was unremarkable. Normal CBC and LFts.  -  Advance diet  to GI soft diet. - Okay to D/C from GI stand point tomorrow if her diarrhea improves and if she is able to tolerate diet.  - F/up in my office in 3 to 4 weeks.  - GI will sign off. Call us back if needed.     LOS: 0 days   Otis Brace  MD, FACP 11/09/2016, 10:42 AM  Pager [phone] If no answer or after 5 PM call [phone]

## 2016-11-09 NOTE — Progress Notes (Signed)
CSW provided transportation information to assist with patient needs.   [name] [name] LCSWA [phone]

## 2016-11-09 NOTE — Care Management Obs Status (Signed)
[facility] NOTIFICATION   Patient Details  Name: [name] MRN: [id number] Date of Birth: 04/29/1936   Medicare Observation Status Notification Given:  Yes    Sharin Mons, RN 11/09/2016, 3:59 PM

## 2016-11-09 NOTE — Progress Notes (Signed)
PROGRESS NOTE    SUMMAR MCGLOTHLIN  BTD:[id number] DOB: [birth year] DOA: 11/08/2016 PCP: [name], MD (Confirm with patient/family/NH records and if not entered, this HAS to be entered at [facility] point of entry. "No PCP" if truly none.) Outpatient Specialists: Theme park manager speciality and name if known)    Brief Narrative:[name] is a 81 y.o. woman with a history of vertigo, HTN, DM complicated by neuropathy, GERD, COPD with chronic respiratory failure (3L [place] at baseline), and IBS who presents to the ED for evaluation of diarrhea for 2-3 weeks.  Assessment & Plan:   Principal Problem:   Diarrhea Active Problems:   COPD mixed type (HCC)   Type II diabetes mellitus, uncontrolled ([place])   Essential hypertension, benign   Chronic respiratory failure with hypoxia (HCC)   Thrombocytopenia ([place]) Possible [place].   Most likely dysfunctional in area of the setting of diabetes with motility disorder. We'll continue to advance the diet slowly, GI recommended soft diet for now, discharged tomorrow no further investigation by colonoscopy is recommended or on a liver biopsy.   DVT prophylaxis: (Lovenox) Code Status: (Full) Family Communication: NONE Disposition Plan: HOME  Consultants:   GI   Procedures:None .    Antimicrobials: (specify start and planned stop date. Auto populated tables are space occupying and do not give end dates)  None .     Subjective: [name] states that her diarrhea is not improving much she is complaining of abdominal pain to a denies any bloody stools or melena she looks cheerful and happy and talkative, no signs of any pain by inspection stable hemodynamically.  Objective: Vitals:   11/08/16 1835 11/08/16 2100 11/09/16 0557 11/09/16 1511  BP: (!) 145/66 116/65 (!) 117/57 (!) 155/63  Pulse: (!) 102 93 92 99  Resp: 18 18 19 20   Temp: 98.1 F (36.7 C)  97.6 F (36.4 C) 98.4 F (36.9 C)  TempSrc: Oral  Oral Oral  SpO2: 100% 100% 100% 98%    Weight: 95.4 kg (210 lb 6.4 oz)     Height: 5' 3"  (1.6 m)       Intake/Output Summary (Last 24 hours) at 11/09/16 1648 Last data filed at 11/09/16 1600  Gross per 24 hour  Intake           681.67 ml  Output                0 ml  Net           681.67 ml   Filed Weights   11/08/16 1835  Weight: 95.4 kg (210 lb 6.4 oz)    Examination:  General exam: NAD. Respiratory system:CTAB. Cardiovascular system: RRR,S1S2. Gastrointestinal system: Abdomen is nondistended, soft and nontender. No organomegaly or masses felt. Normal bowel sounds heard. Central nervous system: Alert and oriented. No focal neurological deficits. Extremities: Symmetric 5 x 5 power. Skin: No rashes, lesions or ulcers Psychiatry: Judgement and insight appear normal. Mood & affect appropriate.     Data Reviewed: I have personally reviewed following labs and imaging studies  CBC:  Recent Labs Lab 11/08/16 1015 11/08/16 1828 11/09/16 0800  WBC 4.7  --  7.5  NEUTROABS 2.2  --   --   HGB 12.6  --  12.7  HCT 40.1 39.9 41.3  MCV 86.1  --  87.5  PLT 67*  --  106   Basic Metabolic Panel:  Recent Labs Lab 11/08/16 1015 11/09/16 0800  NA 135 139  K 4.0 4.1  CL 101  103  CO2 26 27  GLUCOSE 221* 139*  BUN 9 <5*  CREATININE 0.72 0.58  CALCIUM 9.1 8.9   GFR: Estimated Creatinine Clearance: 62.7 mL/min (by C-G formula based on SCr of 0.58 mg/dL). Liver Function Tests:  Recent Labs Lab 11/08/16 1015  AST 38  ALT 17  ALKPHOS 39  BILITOT 0.7  PROT 6.6  ALBUMIN 3.4*    Recent Labs Lab 11/08/16 1015  LIPASE 20   No results for input(s): AMMONIA in the last 168 hours. Coagulation Profile: No results for input(s): INR, PROTIME in the last 168 hours. Cardiac Enzymes: No results for input(s): CKTOTAL, CKMB, CKMBINDEX, TROPONINI in the last 168 hours. BNP (last 3 results) No results for input(s): PROBNP in the last 8760 hours. HbA1C: No results for input(s): HGBA1C in the last 72  hours. CBG:  Recent Labs Lab 11/08/16 1824 11/08/16 2143 11/09/16 0237 11/09/16 0756 11/09/16 1242  GLUCAP 130* 178* 122* 122* 189*   Lipid Profile: No results for input(s): CHOL, HDL, LDLCALC, TRIG, CHOLHDL, LDLDIRECT in the last 72 hours. Thyroid Function Tests: No results for input(s): TSH, T4TOTAL, FREET4, T3FREE, THYROIDAB in the last 72 hours. Anemia Panel:  Recent Labs  11/08/16 1828  VITAMINB12 340   Urine analysis:    Component Value Date/Time   COLORURINE YELLOW 11/08/2016 1435   APPEARANCEUR CLEAR 11/08/2016 1435   LABSPEC 1.018 11/08/2016 1435   PHURINE 5.0 11/08/2016 1435   GLUCOSEU 150 (A) 11/08/2016 1435   GLUCOSEU 100 (A) 10/14/2014 1109   HGBUR NEGATIVE 11/08/2016 1435   BILIRUBINUR NEGATIVE 11/08/2016 1435   BILIRUBINUR negative 03/21/2016 1406   KETONESUR NEGATIVE 11/08/2016 1435   PROTEINUR NEGATIVE 11/08/2016 1435   UROBILINOGEN 0.2 03/21/2016 1406   UROBILINOGEN 0.2 10/14/2014 1109   NITRITE NEGATIVE 11/08/2016 1435   LEUKOCYTESUR NEGATIVE 11/08/2016 1435   Sepsis Labs: @LABRCNTIP (procalcitonin:4,lacticidven:4)  ) Recent Results (from the past 240 hour(s))  Urine culture     Status: None   Collection Time: 11/08/16  2:35 PM  Result Value Ref Range Status   Specimen Description URINE, CLEAN CATCH  Final   Special Requests Normal  Final   Culture NO GROWTH  Final   Report Status 11/09/2016 FINAL  Final  C difficile quick scan w PCR reflex     Status: None   Collection Time: 11/08/16  8:20 PM  Result Value Ref Range Status   C Diff antigen NEGATIVE NEGATIVE Final   C Diff toxin NEGATIVE NEGATIVE Final   C Diff interpretation No C. difficile detected.  Final  Gastrointestinal Panel by PCR , Stool     Status: None   Collection Time: 11/08/16  8:20 PM  Result Value Ref Range Status   Campylobacter species NOT DETECTED NOT DETECTED Final   Plesimonas shigelloides NOT DETECTED NOT DETECTED Final   Salmonella species NOT DETECTED NOT  DETECTED Final   Yersinia enterocolitica NOT DETECTED NOT DETECTED Final   Vibrio species NOT DETECTED NOT DETECTED Final   Vibrio cholerae NOT DETECTED NOT DETECTED Final   Enteroaggregative E coli (EAEC) NOT DETECTED NOT DETECTED Final   Enteropathogenic E coli (EPEC) NOT DETECTED NOT DETECTED Final   Enterotoxigenic E coli (ETEC) NOT DETECTED NOT DETECTED Final   Shiga like toxin producing E coli (STEC) NOT DETECTED NOT DETECTED Final   Shigella/Enteroinvasive E coli (EIEC) NOT DETECTED NOT DETECTED Final   Cryptosporidium NOT DETECTED NOT DETECTED Final   Cyclospora cayetanensis NOT DETECTED NOT DETECTED Final   Entamoeba histolytica NOT DETECTED NOT  DETECTED Final   Giardia lamblia NOT DETECTED NOT DETECTED Final   Adenovirus F40/41 NOT DETECTED NOT DETECTED Final   Astrovirus NOT DETECTED NOT DETECTED Final   Norovirus GI/GII NOT DETECTED NOT DETECTED Final   Rotavirus A NOT DETECTED NOT DETECTED Final   Sapovirus (I, II, IV, and V) NOT DETECTED NOT DETECTED Final         Radiology Studies: Ct Abdomen Pelvis W Contrast  Result Date: 11/08/2016 CLINICAL DATA:  Diarrhea for 2 weeks.  Left lower quadrant pain. EXAM: CT ABDOMEN AND PELVIS WITH CONTRAST TECHNIQUE: Multidetector CT imaging of the abdomen and pelvis was performed using the standard protocol following bolus administration of intravenous contrast. CONTRAST:  140m ISOVUE-300 IOPAMIDOL (ISOVUE-300) INJECTION 61% COMPARISON:  None. FINDINGS: Lower chest: Atelectasis and/ or scarring noted in the lower lungs. Coronary artery calcification is noted. Hepatobiliary: Unusual pattern of low attenuation identified within the liver parenchyma. Gallbladder surgically absent No intrahepatic or extrahepatic biliary dilation. Pancreas: Pancreas is diffusely atrophic. Spleen: Spleen is unremarkable. Adrenals/Urinary Tract: No adrenal nodule or mass. Kidneys unremarkable. No evidence for hydroureter. Tiny gas bubble identified in the bladder  lumen. Stomach/Bowel: Stomach is nondistended. No gastric wall thickening. No evidence of outlet obstruction. Large duodenal diverticulum evident although this could be a markedly patulous duodenum bulb. Duodenum otherwise unremarkable. No small bowel wall thickening. No small bowel dilatation. The terminal ileum is normal. The appendix is not visualized, but there is no edema or inflammation in the region of the cecum. No gross colonic mass. No colonic wall thickening. No substantial diverticular change. Vascular/Lymphatic: There is abdominal aortic atherosclerosis without aneurysm. There is no gastrohepatic or hepatoduodenal ligament lymphadenopathy. No intraperitoneal or retroperitoneal lymphadenopathy. No pelvic sidewall lymphadenopathy. Reproductive: Uterus surgically absent.  There is no adnexal mass. Other: No intraperitoneal free fluid. Musculoskeletal: Bone windows reveal no worrisome lytic or sclerotic osseous lesions. IMPRESSION: 1. Unusual enhancement pattern in the liver. Liver contour appears somewhat nodular along the left liver raising the question of cirrhosis. As such, unusual enhancement pattern may be related to underlying areas of fibrosis. Alternatively features may be related to an atypical appearance of fatty deposition within the parenchyma. Follow-up abdominal MRI without and with contrast is recommended to further evaluate. 2. Small gas bubble in the urinary bladder. If the patient has had no recent urinary instrumentation, bladder infection would be a consideration. 3. Coronary artery and thoracoabdominal aortic atherosclerosis. 4. No findings in the colon to explain the patient's history of diarrhea and rectal bleeding. Electronically Signed   By: EMisty StanleyM.D.   On: 11/08/2016 18:04        Scheduled Meds: . colestipol  2 g Oral QHS  . hydrocortisone  1 application Rectal TID  . insulin aspart  0-20 Units Subcutaneous TID WC  . insulin aspart  0-5 Units Subcutaneous QHS   . insulin detemir  10 Units Subcutaneous QHS  . irbesartan  75 mg Oral QHS  . metoprolol succinate  25 mg Oral Daily  . pantoprazole  40 mg Oral Daily  . pravastatin  20 mg Oral Q supper  . umeclidinium-vilanterol  1 puff Inhalation Daily   Continuous Infusions: . sodium chloride Stopped (11/09/16 0149)     LOS: 0 days    Time spent: More than 35 minutes .   EWaldron Session MD [facility] Pager [phone]  If 7PM-7AM, please contact night-coverage [email or web address] Password [email or web address]7/09/2016, 4:48 PM

## 2016-11-10 DIAGNOSIS — E860 Dehydration: Secondary | ICD-10-CM | POA: Diagnosis not present

## 2016-11-10 DIAGNOSIS — R197 Diarrhea, unspecified: Secondary | ICD-10-CM | POA: Diagnosis not present

## 2016-11-10 LAB — CBC
HCT: 42.4 % (ref 36.0–46.0)
HEMOGLOBIN: 12.6 g/dL (ref 12.0–15.0)
MCH: 26.5 pg (ref 26.0–34.0)
MCHC: 29.7 g/dL — AB (ref 30.0–36.0)
MCV: 89.3 fL (ref 78.0–100.0)
PLATELETS: 173 10*3/uL (ref 150–400)
RBC: 4.75 MIL/uL (ref 3.87–5.11)
RDW: 15 % (ref 11.5–15.5)
WBC: 6.9 10*3/uL (ref 4.0–10.5)

## 2016-11-10 LAB — COMPREHENSIVE METABOLIC PANEL
ALBUMIN: 3.4 g/dL — AB (ref 3.5–5.0)
ALT: 20 U/L (ref 14–54)
ANION GAP: 6 (ref 5–15)
AST: 33 U/L (ref 15–41)
Alkaline Phosphatase: 36 U/L — ABNORMAL LOW (ref 38–126)
BUN: <5 mg/dL — ABNORMAL LOW (ref 6–20)
CALCIUM: 8.7 mg/dL — AB (ref 8.9–10.3)
CHLORIDE: 102 mmol/L (ref 101–111)
CO2: 29 mmol/L (ref 22–32)
Creatinine, Ser: 0.6 mg/dL (ref 0.44–1.00)
GFR calc non Af Amer: >60 mL/min (ref >60)
GLUCOSE: 180 mg/dL — AB (ref 65–99)
POTASSIUM: 3.9 mmol/L (ref 3.5–5.1)
SODIUM: 137 mmol/L (ref 135–145)
Total Bilirubin: 0.5 mg/dL (ref 0.3–1.2)
Total Protein: 6.6 g/dL (ref 6.5–8.1)

## 2016-11-10 LAB — GLUCOSE, CAPILLARY
GLUCOSE-CAPILLARY: 189 mg/dL — AB (ref 65–99)
Glucose-Capillary: 221 mg/dL — ABNORMAL HIGH (ref 65–99)

## 2016-11-10 NOTE — Telephone Encounter (Signed)
Patient requesting a call from [place]. Patient states that she is to be d/c from the hospital today and does not feel that she is ready. "The doctors did not find anything significant." Call patient to advise as soon as possible, she is on 5W room 10.

## 2016-11-10 NOTE — Care Management Note (Signed)
Case Management Note  Patient Details  Name: [name] MRN: [id number] Date of Birth: 10-13-1935  Subjective/Objective:       Presents with c/o diarrhea x 2-3 , hx of vertigo, HTN, DM complicated by neuropathy, GERD, COPD with chronic respiratory failure (3L [place] at baseline), and IBS .  Action/Plan: Plan is to d/c to home today. Portable oxygen tank provided through [facility] for transportation to home. CSW is to provide pt with taxi voucher for transportation to home.  Expected Discharge Date:  11/10/16               Expected Discharge Plan:  [place]  In-House Referral:     Discharge planning Services  CM Consult  Post Acute Care Choice:    Choice offered to:  Patient  DME Arranged:  Oxygen (active with [facility] PTA) DME Agency:  [place] Arranged:  RN, Nurse's Aide, PT, OT [facility] Agency:  [place], referral made with Butch Penny  Status of Service:  Completed, signed off  If discussed at [place] of Stay Meetings, dates discussed:    Additional Comments:  Sharin Mons, RN 11/10/2016, 2:40 PM

## 2016-11-10 NOTE — Telephone Encounter (Signed)
I have reviewed her hospital records and they have not found any infection or reason to keep her in the hospital.  She can continue to take what they prescribed or the cholestyramine powder half to 1 scoop for diarrhea as before

## 2016-11-10 NOTE — Progress Notes (Signed)
Patient discharge teaching given, including activity, diet, follow-up appoints, and medications. Patient verbalized understanding of all discharge instructions. IV access was d/c'd. Vitals are stable. Skin is intact except as charted in most recent assessments. Pt to be escorted out by NT with taxi voucher.

## 2016-11-10 NOTE — Telephone Encounter (Signed)
Called hospital room that patient is staying in. They are wanting to send her home. She Dr. Marica Otter doctor came in to talk to her. She stated her BP is all over the place, sugars are doing okay, stomach pain a little better, diarrhea still bad. They say not much they can do. Room phone [phone]. Please advise.

## 2016-11-10 NOTE — Discharge Summary (Signed)
Physician Discharge Summary  [name] PFX:[id number] DOB: 19-Aug-1935 DOA: 11/08/2016  PCP: [name] Snare, MD  Admit date: 11/08/2016 Discharge date: 11/10/2016  Admitted From: (Home) Disposition:  (Home)  Recommendations for Outpatient Follow-up:  1. Follow up with PCP as needed .  Home Health:No Equipment/Devices none  Discharge Condition: Stable CODE STATUS: Full. Diet recommendation: Diabetic diet.  Brief/Interim Summary:  [name] is a very pleasant 81 year old Caucasian female with a known history of diabetes insulin-dependent complicated with gastropathy with chronic diarrhea for which she had a thorough workup in the past without any conclusive specific etiology other than the diabetes, she is coming to our hospital with worsening diarrhea for the last 2 weeks with an episode of bloody stool that resolved , she had a workup by CBC that showed stable hemoglobin she was treated symptomatically and GI consult has been placed who recommended follow-up as an outpatient,  Imodium has been given with subsequent improvement in her symptoms ( no infectious etiology was detected ). Patient's stool started to improve with the discharge she was seen face-to-face before discharge without any complaints.  Discharge Diagnoses:    1-Dysfuntional Diarrhea . 2-Possible [place] with thrombocytopenia . 3-IDDM. 4-Hx of COPD.    Discharge Instructions  Discharge Instructions    Diet - low sodium heart healthy    Complete by:  As directed    Increase activity slowly    Complete by:  As directed      Allergies as of 11/10/2016      Reactions   Chlordiazepoxide-clidinium Other (See Comments)   Sleepy,weak   Biaxin [clarithromycin] Other (See Comments)   Foul taste, abd pain, diarrhea   Tramadol Other (See Comments)   Caused tremors   Codeine Other (See Comments)   REACTION: gi upset- only in high doses per pt.  Can tolerate cough syrup with codeine.       Medication List    STOP  taking these medications   metroNIDAZOLE 500 MG tablet Commonly known as:  FLAGYL   PROMETHAZINE VC/CODEINE 6.25-5-10 MG/5ML Syrp   valsartan 160 MG tablet Commonly known as:  DIOVAN     TAKE these medications   acetaminophen 500 MG tablet Commonly known as:  TYLENOL Take 1,000 mg by mouth every 6 (six) hours as needed for headache (pain).   ALPRAZolam 0.5 MG tablet Commonly known as:  XANAX take 1/2 to 1 tablet by mouth twice a day What changed:  how much to take  how to take this  when to take this  reasons to take this  additional instructions   aspirin 325 MG tablet Take 325 mg by mouth daily.   B-D UF III MINI PEN NEEDLES 31G X 5 MM Misc Generic drug:  Insulin Pen Needle USE 5 PER DAY TO INJECT INSULIN   B-D ULTRAFINE III SHORT PEN 31G X 8 MM Misc Generic drug:  Insulin Pen Needle TEST 3 TIMES A DAY   cholestyramine 4 g packet Commonly known as:  QUESTRAN take 1 packet once daily if needed for diarrhea   dicyclomine 10 MG capsule Commonly known as:  BENTYL take 1 capsule by mouth once daily What changed:  See the new instructions.   glucose blood test strip Commonly known as:  ACCU-CHEK AVIVA PLUS Use as instructed to check blood sugars 8 times per day dx code 250.02   hydrocortisone 2.5 % rectal cream Commonly known as:  ANUSOL-HC Place 1 application rectally 3 (three) times daily.   insulin aspart 100  UNIT/ML FlexPen Commonly known as:  NOVOLOG FLEXPEN Inject 30 units at breakfast- 24 units at lunch- and 30 units at supper What changed:  how much to take  how to take this  when to take this  additional instructions   Insulin Syringe-Needle U-100 31G X 15/64" 0.5 ML Misc Commonly known as:  BD INSULIN SYRINGE ULTRAFINE Use 2 per day dx code E11.65   magic mouthwash Soln Swish with 2 teaspoonfuls by mouth as needed What changed:  how to take this  when to take this  additional instructions   meclizine 25 MG tablet Commonly  known as:  ANTIVERT take 1 tablet by mouth three times a day if needed for dizziness What changed:  See the new instructions.   metoprolol succinate 25 MG 24 hr tablet Commonly known as:  TOPROL-XL take 1 tablet by mouth once daily   omeprazole 20 MG capsule Commonly known as:  PRILOSEC Take 1 capsule (20 mg total) by mouth daily.   OXYGEN Inhale 3 L into the lungs continuous.   pravastatin 20 MG tablet Commonly known as:  PRAVACHOL Take 1 tablet (20 mg total) by mouth daily. What changed:  when to take this   [place] 2.5-2.5 MCG/ACT Aers Generic drug:  Tiotropium Bromide-Olodaterol INHALE 2 PUFFS ONCE DAILY.   TRESIBA FLEXTOUCH 200 UNIT/ML Sopn Generic drug:  Insulin Degludec inject 94 units INTO THE SKIN DAILY What changed:  how much to take  when to take this  additional instructions   trimethoprim 100 MG tablet Commonly known as:  TRIMPEX Take 100 mg by mouth at bedtime. Reported on 09/06/2015   XOPENEX HFA 45 MCG/ACT inhaler Generic drug:  levalbuterol inhale 2 puffs INTO THE LUNGS every 6 hours if needed for wheezing or shortness of breath       Allergies  Allergen Reactions  . Chlordiazepoxide-Clidinium Other (See Comments)    Sleepy,weak  . Biaxin [Clarithromycin] Other (See Comments)    Foul taste, abd pain, diarrhea  . Tramadol Other (See Comments)    Caused tremors  . Codeine Other (See Comments)    REACTION: gi upset- only in high doses per pt.  Can tolerate cough syrup with codeine.     Consultations:  GI   Procedures/Studies: Ct Abdomen Pelvis W Contrast  Result Date: 11/08/2016 CLINICAL DATA:  Diarrhea for 2 weeks.  Left lower quadrant pain. EXAM: CT ABDOMEN AND PELVIS WITH CONTRAST TECHNIQUE: Multidetector CT imaging of the abdomen and pelvis was performed using the standard protocol following bolus administration of intravenous contrast. CONTRAST:  129m ISOVUE-300 IOPAMIDOL (ISOVUE-300) INJECTION 61% COMPARISON:  None.  FINDINGS: Lower chest: Atelectasis and/ or scarring noted in the lower lungs. Coronary artery calcification is noted. Hepatobiliary: Unusual pattern of low attenuation identified within the liver parenchyma. Gallbladder surgically absent No intrahepatic or extrahepatic biliary dilation. Pancreas: Pancreas is diffusely atrophic. Spleen: Spleen is unremarkable. Adrenals/Urinary Tract: No adrenal nodule or mass. Kidneys unremarkable. No evidence for hydroureter. Tiny gas bubble identified in the bladder lumen. Stomach/Bowel: Stomach is nondistended. No gastric wall thickening. No evidence of outlet obstruction. Large duodenal diverticulum evident although this could be a markedly patulous duodenum bulb. Duodenum otherwise unremarkable. No small bowel wall thickening. No small bowel dilatation. The terminal ileum is normal. The appendix is not visualized, but there is no edema or inflammation in the region of the cecum. No gross colonic mass. No colonic wall thickening. No substantial diverticular change. Vascular/Lymphatic: There is abdominal aortic atherosclerosis without aneurysm. There is no gastrohepatic  or hepatoduodenal ligament lymphadenopathy. No intraperitoneal or retroperitoneal lymphadenopathy. No pelvic sidewall lymphadenopathy. Reproductive: Uterus surgically absent.  There is no adnexal mass. Other: No intraperitoneal free fluid. Musculoskeletal: Bone windows reveal no worrisome lytic or sclerotic osseous lesions. IMPRESSION: 1. Unusual enhancement pattern in the liver. Liver contour appears somewhat nodular along the left liver raising the question of cirrhosis. As such, unusual enhancement pattern may be related to underlying areas of fibrosis. Alternatively features may be related to an atypical appearance of fatty deposition within the parenchyma. Follow-up abdominal MRI without and with contrast is recommended to further evaluate. 2. Small gas bubble in the urinary bladder. If the patient has had no  recent urinary instrumentation, bladder infection would be a consideration. 3. Coronary artery and thoracoabdominal aortic atherosclerosis. 4. No findings in the colon to explain the patient's history of diarrhea and rectal bleeding. Electronically Signed   By: Misty Stanley M.D.   On: 11/08/2016 18:04    (Echo, Carotid, EGD, Colonoscopy, ERCP)    Subjective:   Discharge Exam: Vitals:   11/09/16 2255 11/10/16 0516  BP: (!) 150/49 (!) 131/49  Pulse: (!) 101 86  Resp: 18 18  Temp: 98 F (36.7 C) 97.7 F (36.5 C)   Vitals:   11/09/16 0557 11/09/16 1511 11/09/16 2255 11/10/16 0516  BP: (!) 117/57 (!) 155/63 (!) 150/49 (!) 131/49  Pulse: 92 99 (!) 101 86  Resp: 19 20 18 18   Temp: 97.6 F (36.4 C) 98.4 F (36.9 C) 98 F (36.7 C) 97.7 F (36.5 C)  TempSrc: Oral Oral Oral Oral  SpO2: 100% 98% 98% 100%  Weight:      Height:        General: Pt is alert, awake, not in acute distress Cardiovascular: RRR, S1/S2 +, no rubs, no gallops Respiratory: CTA bilaterally, no wheezing, no rhonchi Abdominal: Soft, NT, ND, bowel sounds + Extremities: no edema, no cyanosis    The results of significant diagnostics from this hospitalization (including imaging, microbiology, ancillary and laboratory) are listed below for reference.     Microbiology: Recent Results (from the past 240 hour(s))  Urine culture     Status: None   Collection Time: 11/08/16  2:35 PM  Result Value Ref Range Status   Specimen Description URINE, CLEAN CATCH  Final   Special Requests Normal  Final   Culture NO GROWTH  Final   Report Status 11/09/2016 FINAL  Final  C difficile quick scan w PCR reflex     Status: None   Collection Time: 11/08/16  8:20 PM  Result Value Ref Range Status   C Diff antigen NEGATIVE NEGATIVE Final   C Diff toxin NEGATIVE NEGATIVE Final   C Diff interpretation No C. difficile detected.  Final  Gastrointestinal Panel by PCR , Stool     Status: None   Collection Time: 11/08/16  8:20 PM   Result Value Ref Range Status   Campylobacter species NOT DETECTED NOT DETECTED Final   Plesimonas shigelloides NOT DETECTED NOT DETECTED Final   Salmonella species NOT DETECTED NOT DETECTED Final   Yersinia enterocolitica NOT DETECTED NOT DETECTED Final   Vibrio species NOT DETECTED NOT DETECTED Final   Vibrio cholerae NOT DETECTED NOT DETECTED Final   Enteroaggregative E coli (EAEC) NOT DETECTED NOT DETECTED Final   Enteropathogenic E coli (EPEC) NOT DETECTED NOT DETECTED Final   Enterotoxigenic E coli (ETEC) NOT DETECTED NOT DETECTED Final   Shiga like toxin producing E coli (STEC) NOT DETECTED NOT DETECTED Final  Shigella/Enteroinvasive E coli (EIEC) NOT DETECTED NOT DETECTED Final   Cryptosporidium NOT DETECTED NOT DETECTED Final   Cyclospora cayetanensis NOT DETECTED NOT DETECTED Final   Entamoeba histolytica NOT DETECTED NOT DETECTED Final   Giardia lamblia NOT DETECTED NOT DETECTED Final   Adenovirus F40/41 NOT DETECTED NOT DETECTED Final   Astrovirus NOT DETECTED NOT DETECTED Final   Norovirus GI/GII NOT DETECTED NOT DETECTED Final   Rotavirus A NOT DETECTED NOT DETECTED Final   Sapovirus (I, II, IV, and V) NOT DETECTED NOT DETECTED Final     Labs: BNP (last 3 results) No results for input(s): BNP in the last 8760 hours. Basic Metabolic Panel:  Recent Labs Lab 11/08/16 1015 11/09/16 0800 11/10/16 0522  NA 135 139 137  K 4.0 4.1 3.9  CL 101 103 102  CO2 26 27 29   GLUCOSE 221* 139* 180*  BUN 9 <5* <5*  CREATININE 0.72 0.58 0.60  CALCIUM 9.1 8.9 8.7*   Liver Function Tests:  Recent Labs Lab 11/08/16 1015 11/10/16 0522  AST 38 33  ALT 17 20  ALKPHOS 39 36*  BILITOT 0.7 0.5  PROT 6.6 6.6  ALBUMIN 3.4* 3.4*    Recent Labs Lab 11/08/16 1015  LIPASE 20   No results for input(s): AMMONIA in the last 168 hours. CBC:  Recent Labs Lab 11/08/16 1015 11/08/16 1828 11/09/16 0800 11/10/16 0522  WBC 4.7  --  7.5 6.9  NEUTROABS 2.2  --   --   --   HGB  12.6  --  12.7 12.6  HCT 40.1 39.9 41.3 42.4  MCV 86.1  --  87.5 89.3  PLT 67*  --  180 173   Cardiac Enzymes: No results for input(s): CKTOTAL, CKMB, CKMBINDEX, TROPONINI in the last 168 hours. BNP: Invalid input(s): POCBNP CBG:  Recent Labs Lab 11/09/16 0756 11/09/16 1242 11/09/16 1818 11/09/16 2257 11/10/16 0815  GLUCAP 122* 189* 181* 168* 189*   D-Dimer No results for input(s): DDIMER in the last 72 hours. Hgb A1c No results for input(s): HGBA1C in the last 72 hours. Lipid Profile No results for input(s): CHOL, HDL, LDLCALC, TRIG, CHOLHDL, LDLDIRECT in the last 72 hours. Thyroid function studies No results for input(s): TSH, T4TOTAL, T3FREE, THYROIDAB in the last 72 hours.  Invalid input(s): FREET3 Anemia work up  Recent Labs  11/08/16 1828  VITAMINB12 340   Urinalysis    Component Value Date/Time   COLORURINE YELLOW 11/08/2016 1435   APPEARANCEUR CLEAR 11/08/2016 1435   LABSPEC 1.018 11/08/2016 1435   PHURINE 5.0 11/08/2016 1435   GLUCOSEU 150 (A) 11/08/2016 1435   GLUCOSEU 100 (A) 10/14/2014 1109   HGBUR NEGATIVE 11/08/2016 1435   BILIRUBINUR NEGATIVE 11/08/2016 1435   BILIRUBINUR negative 03/21/2016 1406   KETONESUR NEGATIVE 11/08/2016 1435   PROTEINUR NEGATIVE 11/08/2016 1435   UROBILINOGEN 0.2 03/21/2016 1406   UROBILINOGEN 0.2 10/14/2014 1109   NITRITE NEGATIVE 11/08/2016 1435   LEUKOCYTESUR NEGATIVE 11/08/2016 1435   Sepsis Labs Invalid input(s): PROCALCITONIN,  WBC,  LACTICIDVEN Microbiology Recent Results (from the past 240 hour(s))  Urine culture     Status: None   Collection Time: 11/08/16  2:35 PM  Result Value Ref Range Status   Specimen Description URINE, CLEAN CATCH  Final   Special Requests Normal  Final   Culture NO GROWTH  Final   Report Status 11/09/2016 FINAL  Final  C difficile quick scan w PCR reflex     Status: None   Collection Time: 11/08/16  8:20  PM  Result Value Ref Range Status   C Diff antigen NEGATIVE NEGATIVE  Final   C Diff toxin NEGATIVE NEGATIVE Final   C Diff interpretation No C. difficile detected.  Final  Gastrointestinal Panel by PCR , Stool     Status: None   Collection Time: 11/08/16  8:20 PM  Result Value Ref Range Status   Campylobacter species NOT DETECTED NOT DETECTED Final   Plesimonas shigelloides NOT DETECTED NOT DETECTED Final   Salmonella species NOT DETECTED NOT DETECTED Final   Yersinia enterocolitica NOT DETECTED NOT DETECTED Final   Vibrio species NOT DETECTED NOT DETECTED Final   Vibrio cholerae NOT DETECTED NOT DETECTED Final   Enteroaggregative E coli (EAEC) NOT DETECTED NOT DETECTED Final   Enteropathogenic E coli (EPEC) NOT DETECTED NOT DETECTED Final   Enterotoxigenic E coli (ETEC) NOT DETECTED NOT DETECTED Final   Shiga like toxin producing E coli (STEC) NOT DETECTED NOT DETECTED Final   Shigella/Enteroinvasive E coli (EIEC) NOT DETECTED NOT DETECTED Final   Cryptosporidium NOT DETECTED NOT DETECTED Final   Cyclospora cayetanensis NOT DETECTED NOT DETECTED Final   Entamoeba histolytica NOT DETECTED NOT DETECTED Final   Giardia lamblia NOT DETECTED NOT DETECTED Final   Adenovirus F40/41 NOT DETECTED NOT DETECTED Final   Astrovirus NOT DETECTED NOT DETECTED Final   Norovirus GI/GII NOT DETECTED NOT DETECTED Final   Rotavirus A NOT DETECTED NOT DETECTED Final   Sapovirus (I, II, IV, and V) NOT DETECTED NOT DETECTED Final     Time coordinating discharge: Over 30 minutes  SIGNED:   Waldron Session, MD  [facility] 11/10/2016, 11:28 AM Pager [phone]  If 7PM-7AM, please contact night-coverage [email or web address] Password [email or web address]

## 2016-11-10 NOTE — Telephone Encounter (Signed)
Called patient at hospital and was unable to get an answer. Called patients home and left a message in regards to the note from Dr. Dwyane Dee.

## 2016-11-10 NOTE — Progress Notes (Signed)
CSW received consult regarding transportation needs. Patient reports having no family and no way to get home. She has a portable oxygen tank. CSW provided taxi voucher.   Percell Locus [place] LCSWA [phone]

## 2016-11-13 ENCOUNTER — Telehealth: Payer: Self-pay | Admitting: Endocrinology

## 2016-11-13 NOTE — Telephone Encounter (Signed)
Patient would like a call from nurse or Dr. Dwyane Dee, was recently d/c from hospital. Would not give specifics on what the return call would be about. Call patient to advise.

## 2016-11-13 NOTE — Telephone Encounter (Signed)
Spoke with the patient but she did not seem to really have a questions- she just wanted to talk about her hospital experience- she and gave her very little insulin stated she was put on 3 different diets in the hospital and was given very little insulin- patient stated she was released from th ehospital and still having diarrhea- patient was prescribed colestio for the diarrhea by Dr. Hale Bogus and will call us back in a couple of days to let us know if it is working or not

## 2016-11-15 NOTE — Telephone Encounter (Signed)
She needs to call the gastroenterology doctor at [facility], Dr Alessandra Bevels

## 2016-11-15 NOTE — Telephone Encounter (Signed)
Patient would like a return phone call ASAP, states she is still "miserably" sick since d/c from hospital.

## 2016-11-15 NOTE — Telephone Encounter (Signed)
Called patient and advised her to call Dr. Alessandra Bevels. She is going to call back and let us know what they advise for her to do.

## 2016-11-15 NOTE — Telephone Encounter (Signed)
Called patient and she stated that the medicine from her new gastro doctor for the diarrhea worked yesterday and today she has had terrible diarrhea. She stated that she found a pink circle about the size of a nickel and white in the middle on her left thigh and said it is not itching. She stated that she feels exhausted and weak. Blood sugars have been up and down but mostly down. I asked her if she had been bitten by a tick but she said she did not think so. Please advise.

## 2016-11-16 ENCOUNTER — Telehealth: Payer: Self-pay

## 2016-11-16 ENCOUNTER — Other Ambulatory Visit: Payer: Self-pay

## 2016-11-16 MED ORDER — CLONIDINE HCL 0.1 MG/24HR TD PTWK: 0.1000 mg | MEDICATED_PATCH | TRANSDERMAL | 12 refills | Status: DC

## 2016-11-16 NOTE — Telephone Encounter (Signed)
Called patient and let her know to take the Clonidine and the Colestid at the same time per Dr. Dwyane Dee. She stated that she will try to stay hydrated and eat her meals.

## 2016-11-16 NOTE — Telephone Encounter (Signed)
Called patient and gave her the new insulin regimen for now. Patient stated that she is currently taking Colestid 1 gm tablet 2 X daily. She asked if she is supposed to take this medicine with the Clonidine patch? Please advise. She also stated that she has stopped the Valsartan and no longer taking. Please advise.

## 2016-11-16 NOTE — Telephone Encounter (Signed)
Patient called to speak to [facility] as soon as possible about her medications and her blood sugars. Patient would not give any further information.

## 2016-11-16 NOTE — Telephone Encounter (Signed)
Called patient and she stated that she has had several lows but she also stated that she is not eating due to the diarrhea. Patient stated that she called Dr. Alessandra Bevels and she is still taking the medicine that the doctor prescribed. Advised for patient to stay hydrated and to eat solid foods to help absorb medications and prevent low blood sugars. She is no better. She needs help. Does she need to lower her insulin dosages due to her not able to eat as much and having lows in the 60's and 70's. She is having a hard time telling when her sugars are dropping. Please advise.

## 2016-11-16 NOTE — Telephone Encounter (Signed)
We can give her a trial of clonidine PATCH 0.1 mg weekly which may help to diarrhea, please send prescription Need to verify what prescription she is taking for diarrhea currently She will however have to stop her valsartan as it is a blood pressure medicine also If she is taking 68 units of Tresiba she can reduce it down to 60 Also can cut back 2-4 units on her NovoLog at lunch and dinner for now

## 2016-11-16 NOTE — Telephone Encounter (Signed)
She needs to check BP daily and see me asap

## 2016-11-17 NOTE — Telephone Encounter (Signed)
Patient returning missed call from [facility]. She is very upset although she was contacted in the time frame that she designated.  Thank you,  -LL

## 2016-11-17 NOTE — Telephone Encounter (Signed)
Called patient and left a voice message about the message from Dr. Dwyane Dee.

## 2016-11-17 NOTE — Telephone Encounter (Signed)
Called patient and left a voice message to stop taking blood pressure medication per Dr. Dwyane Dee and to check blood pressure daily and to call us back to have a follow up appointment ASAP!

## 2016-11-17 NOTE — Telephone Encounter (Signed)
Patient is having concerns about the Clonidine. Please advise.

## 2016-11-17 NOTE — Telephone Encounter (Signed)
Patient returning missed call from Megan F.  Thank you, -LL

## 2016-11-17 NOTE — Telephone Encounter (Signed)
Called patient and she stated that her BP was 152/68. She is worried about taking the Toprol. She is worried that her blood pressure may get out of whack again. Dr. Dwyane Dee verbally stated to stay on her metoprolol. She has not had diarrhea today although she has felt like she could have. She already has an appointment on 12/20/2016, she wants to keep this appointment.

## 2016-11-17 NOTE — Telephone Encounter (Signed)
We do not have any other treatment options except for the clonidine patch.  If she has any low blood pressure symptoms with this she can always take the patch off right then  Only other option would be to  try taking her Colestid 4 times a day instead of 2 times a day to help with the diarrhea

## 2016-11-17 NOTE — Telephone Encounter (Signed)
Called patient and left a voice message that she needed to stop her BP medicine from Dr. Ronnie Derby message and to check her blood pressure daily and to call us back to make a follow up appointment ASAP!!

## 2016-11-17 NOTE — Telephone Encounter (Signed)
Patient on on bp pills and was given clonidine can lower it more. She is concerned about side effects of clonidine.  Still has severe diarrhea.  If you cant' call by 12:00 pm please try after 1:30pm today.  Please call back to discuss.  Thank you,  -LL

## 2016-11-27 ENCOUNTER — Telehealth: Payer: Self-pay | Admitting: Endocrinology

## 2016-11-27 ENCOUNTER — Other Ambulatory Visit: Payer: Medicare Other

## 2016-11-27 NOTE — Telephone Encounter (Signed)
We have advised her several times that clonidine is the only possible solution because she has diabetic diarrhea.  She can always take off the patch if her blood pressure starts coming down too quickly

## 2016-11-27 NOTE — Telephone Encounter (Signed)
Called patient and she stated that she has not had much difference in her bowels. She called the new gastro doctor and they advised to take Immodium. This has slowed the diarrhea down some but not firm yet and she is going to see this doctor on Monday next week. She also stated that she is uneasy about taking the Clonidine patch due to lowering her blood pressure medicine. She is not longer taking the Diovan. She has been taking Metoprolol. She is also in terrible stomach pain also and this is no better. Please advise.

## 2016-11-27 NOTE — Telephone Encounter (Signed)
Wants to update on her progress since being discharged from hospital. Not feeling better.  Thank you,  -LL

## 2016-11-28 NOTE — Telephone Encounter (Signed)
Called patient and let her know what Dr. Dwyane Dee stated and she is still unsure if she wants to try the clonidine patch as she stated that it has no mention of treating diarrhea on the paperwork for the patch. I advised her to try the patch to see if it helps and if it is not helping she can take off the patch. She asked if she is still supposed to stay on her Metoprolol while on the patch? Please advise.

## 2016-11-28 NOTE — Telephone Encounter (Signed)
Called patient and let her know the note from Dr. Dwyane Dee. She stated that she is still taking the Clomid. She has also taken the Immodium which has helped her some also. She stated that she will try the patch and let us know how it works.

## 2016-11-28 NOTE — Telephone Encounter (Signed)
She will continue the metoprolol as it controls heart rate.  The clonidine is off label treatment for the diarrhea and good chance it'll work

## 2016-12-04 DIAGNOSIS — K625 Hemorrhage of anus and rectum: Secondary | ICD-10-CM | POA: Diagnosis not present

## 2016-12-04 DIAGNOSIS — K589 Irritable bowel syndrome without diarrhea: Secondary | ICD-10-CM | POA: Diagnosis not present

## 2016-12-04 DIAGNOSIS — K529 Noninfective gastroenteritis and colitis, unspecified: Secondary | ICD-10-CM | POA: Diagnosis not present

## 2016-12-11 ENCOUNTER — Ambulatory Visit: Payer: Medicare Other | Admitting: Gastroenterology

## 2016-12-15 ENCOUNTER — Encounter: Payer: Self-pay | Admitting: Endocrinology

## 2016-12-19 ENCOUNTER — Other Ambulatory Visit: Payer: Self-pay | Admitting: Endocrinology

## 2016-12-20 ENCOUNTER — Encounter: Payer: Self-pay | Admitting: Endocrinology

## 2016-12-20 ENCOUNTER — Ambulatory Visit (INDEPENDENT_AMBULATORY_CARE_PROVIDER_SITE_OTHER): Payer: Medicare Other | Admitting: Endocrinology

## 2016-12-20 VITALS — BP 134/86 | HR 102 | Ht 63.0 in | Wt 201.4 lb

## 2016-12-20 DIAGNOSIS — R197 Diarrhea, unspecified: Secondary | ICD-10-CM

## 2016-12-20 DIAGNOSIS — Z794 Long term (current) use of insulin: Secondary | ICD-10-CM | POA: Diagnosis not present

## 2016-12-20 DIAGNOSIS — R252 Cramp and spasm: Secondary | ICD-10-CM | POA: Diagnosis not present

## 2016-12-20 DIAGNOSIS — E1165 Type 2 diabetes mellitus with hyperglycemia: Secondary | ICD-10-CM

## 2016-12-20 DIAGNOSIS — I10 Essential (primary) hypertension: Secondary | ICD-10-CM

## 2016-12-20 LAB — COMPREHENSIVE METABOLIC PANEL
ALT: 17 U/L (ref 0–35)
AST: 29 U/L (ref 0–37)
Albumin: 4.3 g/dL (ref 3.5–5.2)
Alkaline Phosphatase: 41 U/L (ref 39–117)
BUN: 12 mg/dL (ref 6–23)
CALCIUM: 9.5 mg/dL (ref 8.4–10.5)
CHLORIDE: 98 meq/L (ref 96–112)
CO2: 34 meq/L — AB (ref 19–32)
CREATININE: 0.66 mg/dL (ref 0.40–1.20)
GFR: 91.7 mL/min (ref >60.00)
Glucose, Bld: 106 mg/dL — ABNORMAL HIGH (ref 70–99)
Potassium: 4.3 mEq/L (ref 3.5–5.1)
Sodium: 138 mEq/L (ref 135–145)
Total Bilirubin: 0.3 mg/dL (ref 0.2–1.2)
Total Protein: 7 g/dL (ref 6.0–8.3)

## 2016-12-20 LAB — MAGNESIUM: Magnesium: 1.6 mg/dL (ref 1.5–2.5)

## 2016-12-20 LAB — POCT GLYCOSYLATED HEMOGLOBIN (HGB A1C): HEMOGLOBIN A1C: 8.1

## 2016-12-20 MED ORDER — METOCLOPRAMIDE HCL 5 MG PO TABS: 5.0000 mg | ORAL_TABLET | Freq: Three times a day (TID) | ORAL | 3 refills | Status: DC | PRN

## 2016-12-20 NOTE — Addendum Note (Signed)
Addended by: Kaylyn Lim I on: 12/20/2016 05:04 PM   Modules accepted: Orders

## 2016-12-20 NOTE — Patient Instructions (Addendum)
Tresiba 56 Novolog 18-22 at meals

## 2016-12-20 NOTE — Progress Notes (Signed)
Patient ID: [name], female   DOB: 04/17/1936, 81 y.o.   MRN: [id number]   Reason for Appointment: Diabetes and follow-up of multiple problems  History of Present Illness    PROBLEM 1:  DIABETES type II since 1980   She has been on insulin using Lantus and Humalog for several years and usually A1c around 8% despite fairly good compliance with diet, exercise and glucose monitoring. She also had been on Actos previously which probably benefited her especially with her fatty liver but she stopped it because of fear of side effects   She was changed from NovoLog to U-500 insulin starting in 02/2016; she did not related was controlling her sugars and preferred to go back to NovoLog.    Recent history:   Insulin regimen: Tresiba  60 units acb   NOVOLOG before meals 18-24 units before meals  Her A1c has been previously consistently around 9- 10% and now 8.1   Her blood sugars have been much lower overall since her problems with starting diarrhea over a month ago  She has had overall decreased appetite also although has not lost weight since her last visit  She has had some tenderness to low normal or low sugars especially overnight  However she has not reduced her [place] further  FASTING blood sugars are overall fairly close to normal  She does have sporadic high readings at different times especially in the afternoon  She continues to check her blood sugars about 6 times a day now  On her last visit her blood sugars are averaging 195 with reading mostly higher in the evenings but now her blood sugars are  Oral hypoglycemic drugs: none       Side effects from medications: None         Proper timing of medications in relation to meals: Yes.          Monitors blood glucose:  5 times a day   Glucometer:  Accu-Chek         Blood Glucose averages recently from meter download:  Mean values apply above for all meters except median for One Touch  PRE-MEAL Fasting  Lunch Dinner Bedtime Overall  Glucose range:  86-1 52     59-1 76    Mean/median: 125  153  162  124  138   POST-MEAL PC Breakfast PC Lunch Overnight   Glucose range:    46-1 35   Mean/median: 155   109     Mean values apply above for all meters except median for One Touch  PRE-MEAL Fasting Lunch Bedtime  Overnight  Overall  Glucose range:  93-220   166-301   10 6-261    Mean/median: 148  151  233  235  195   Suppertime average 211  Meals: 3 meals per day.  Smaller portions; lunch 1-2 pm Supper 6 to 7 PM     Physical activity: exercise: Unable to do any because of dyspnea and fatigue            Dietician visit: Most recent: Several years ago           Complications: are: Peripheral neuropathy with sensory loss     Wt Readings from Last 3 Encounters:  12/20/16 201 lb 6.4 oz (91.4 kg)  11/08/16 210 lb 6.4 oz (95.4 kg)  09/18/16 200 lb (90.7 kg)    Lab Results  Component Value Date   HGBA1C 8.1 12/20/2016   HGBA1C 9.4 09/18/2016  HGBA1C 9.6 06/21/2016   Lab Results  Component Value Date   MICROALBUR 6.9 (H) 10/14/2014   LDLCALC 59 06/21/2016   CREATININE 0.66 12/20/2016     OTHER Current problems  are detailed in review of systems   Allergies as of 12/20/2016      Reactions   Chlordiazepoxide-clidinium Other (See Comments)   Sleepy,weak   Biaxin [clarithromycin] Other (See Comments)   Foul taste, abd pain, diarrhea   Tramadol Other (See Comments)   Caused tremors   Codeine Other (See Comments)   REACTION: gi upset- only in high doses per pt.  Can tolerate cough syrup with codeine.       Medication List       Accurate as of 12/20/16  4:25 PM. Always use your most recent med list.          acetaminophen 500 MG tablet Commonly known as:  TYLENOL Take 1,000 mg by mouth every 6 (six) hours as needed for headache (pain).   ALPRAZolam 0.5 MG tablet Commonly known as:  XANAX take 1/2 to 1 tablet by mouth twice a day   aspirin 325 MG tablet Take 325 mg by  mouth daily.   B-D UF III MINI PEN NEEDLES 31G X 5 MM Misc Generic drug:  Insulin Pen Needle USE 5 PER DAY TO INJECT INSULIN   B-D ULTRAFINE III SHORT PEN 31G X 8 MM Misc Generic drug:  Insulin Pen Needle TEST 3 TIMES A DAY   cholestyramine 4 g packet Commonly known as:  QUESTRAN take 1 packet once daily if needed for diarrhea   cloNIDine 0.1 mg/24hr patch Commonly known as:  CATAPRES - Dosed in mg/24 hr Place 1 patch (0.1 mg total) onto the skin once a week.   dicyclomine 10 MG capsule Commonly known as:  BENTYL take 1 capsule by mouth once daily   glucose blood test strip Commonly known as:  ACCU-CHEK AVIVA PLUS Use as instructed to check blood sugars 8 times per day dx code 250.02   hydrocortisone 2.5 % rectal cream Commonly known as:  ANUSOL-HC Place 1 application rectally 3 (three) times daily.   insulin aspart 100 UNIT/ML FlexPen Commonly known as:  NOVOLOG FLEXPEN Inject 30 units at breakfast- 24 units at lunch- and 30 units at supper   Insulin Syringe-Needle U-100 31G X 15/64" 0.5 ML Misc Commonly known as:  BD INSULIN SYRINGE ULTRAFINE Use 2 per day dx code E11.65   magic mouthwash Soln Swish with 2 teaspoonfuls by mouth as needed   meclizine 25 MG tablet Commonly known as:  ANTIVERT take 1 tablet by mouth three times a day if needed for dizziness   metoCLOPramide 5 MG tablet Commonly known as:  REGLAN Take 1 tablet (5 mg total) by mouth every 8 (eight) hours as needed for nausea.   metoprolol succinate 25 MG 24 hr tablet Commonly known as:  TOPROL-XL take 1 tablet by mouth once daily   omeprazole 20 MG capsule Commonly known as:  PRILOSEC take 1 capsule by mouth once daily   OXYGEN Inhale 3 L into the lungs continuous.   pravastatin 20 MG tablet Commonly known as:  PRAVACHOL Take 1 tablet (20 mg total) by mouth daily.   STIOLTO RESPIMAT 2.5-2.5 MCG/ACT Aers Generic drug:  Tiotropium Bromide-Olodaterol INHALE 2 PUFFS ONCE DAILY.   TRESIBA  FLEXTOUCH 200 UNIT/ML Sopn Generic drug:  Insulin Degludec inject 94 units INTO THE SKIN DAILY   trimethoprim 100 MG tablet Commonly known as:  TRIMPEX  Take 100 mg by mouth at bedtime. Reported on 09/06/2015   XOPENEX HFA 45 MCG/ACT inhaler Generic drug:  levalbuterol inhale 2 puffs INTO THE LUNGS every 6 hours if needed for wheezing or shortness of breath       Allergies:  Allergies  Allergen Reactions  . Chlordiazepoxide-Clidinium Other (See Comments)    Sleepy,weak  . Biaxin [Clarithromycin] Other (See Comments)    Foul taste, abd pain, diarrhea  . Tramadol Other (See Comments)    Caused tremors  . Codeine Other (See Comments)    REACTION: gi upset- only in high doses per pt.  Can tolerate cough syrup with codeine.     Past Medical History:  Diagnosis Date  . Allergic rhinitis, cause unspecified    Sinus CT Rec 12-23-2009  . Arthritis   . Benign paroxysmal positional vertigo 08/09/2012  . 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 Complication of anesthesia    takes along time to wake up  . Diabetes mellitus   . Diarrhea   . Diverticulosis   . Esophageal reflux   . Esophageal stricture   . GERD (gastroesophageal reflux disease)   . Glaucoma   . Hiatal hernia   . Hypertension   . Irritable bowel syndrome   . Neuropathy in diabetes ([place]) 08/09/2012  . Other chronic nonalcoholic liver disease   . Oxygen deficiency   . Sciatica   . Shingles 2010  . Shortness of breath   . Steatohepatitis     Past Surgical History:  Procedure Laterality Date  . APPENDECTOMY    . CARPAL TUNNEL RELEASE     right hand  . CATARACT EXTRACTION     Bilateral  . CHOLECYSTECTOMY OPEN  1978  . COLONOSCOPY  07-2001   mild diverticulosis  . ESOPHAGOGASTRODUODENOSCOPY  2130,86-57   H Hernia,es.stricture s/p dil 33F  . EYE SURGERY    . LIVER BIOPSY  08-1992  . PARATHYROID EXPLORATION    . TONSILLECTOMY AND ADENOIDECTOMY    . TOTAL  ABDOMINAL HYSTERECTOMY    . ULNAR NERVE TRANSPOSITION  12/07/2011   Procedure: ULNAR NERVE DECOMPRESSION/TRANSPOSITION;this was cancelled-not done  Surgeon: Cammie Sickle., MD;  Location: [facility];  Service: Orthopedics;  Laterality: Right;  right ulnar nerve in situ decompression  . ULNAR TUNNEL RELEASE  03/07/2012   Procedure: CUBITAL TUNNEL RELEASE;  Surgeon: Roseanne Kaufman, MD;  Location: [facility];  Service: Orthopedics;  Laterality: Right;  ulnar nerve release at the elbow      Family History  Problem Relation Age of Onset  . Heart disease Father   . Lung cancer Mother        small cell;Byssinosis  . Lung cancer Sister   . Liver cancer Sister        ? mets from another area of the body  . Diabetes Unknown        grandmother  . Stroke Maternal Grandfather     Social History:  reports that she quit smoking about 28 years ago. Her smoking use included Cigarettes. She has never used smokeless tobacco. She reports that she does not drink alcohol or use drugs.  Review of Systems:  Diarrhea: She has had significant worsening of her diarrhea over the last couple of months Although she has not had a colonoscopy recently she had negative studies otherwise during her workup Her gastroenterologist has recommended taking Colestid and Bentyl She does not think this is  helping in controlling the diarrhea and she still has intermittent severe diarrhea and incontinence She has had a couple of episodes of maroon-colored stools and once black stools No abdominal pain She was recommended trying her Catapres patch but her gastroenterologist did not want her to use this  She is also having waves of severe nausea at times without vomiting Appetite is decreased overall No weight loss recently  HYPERTENSION:   blood pressure is well controlled  She has occasionally checked her readings at home and is are not high She has been off her valsartan because of low  blood pressure in the hospital   BP Readings from Last 3 Encounters:  12/20/16 134/86  11/10/16 (!) 131/49  11/06/16 (!) 142/75    She takes Xanax at night mostly and lorazepam in the daytime for anxiety  HYPERLIPIDEMIA:  The lipid abnormality consists of elevated LDL treated with pravastatin 20 mg  which she is tolerating Her last LDL is  below 100    Lab Results  Component Value Date   CHOL 142 06/21/2016   HDL 46.90 06/21/2016   LDLCALC 59 06/21/2016   LDLDIRECT 89.0 10/14/2014   TRIG 179.0 (H) 06/21/2016   CHOLHDL 3 06/21/2016    She is followed by pulmonologist for chronic COPD, taking home oxygen   DIZZINESS: She still has occasional episodes of dizzy feelings without lightheadedness or faintness   NEUROPATHY: She has numbness in her feet.  Also has tingling and numbness in her hands Foot exam done in 10/17: Foot exam shows sensory loss distally, normal pulses and no skin lesions Does not like pain medications   Examination:   BP 134/86   Pulse (!) 102   Ht 5' 3"  (1.6 m)   Wt 201 lb 6.4 oz (91.4 kg)   SpO2 95%   BMI 35.68 kg/m   Body mass index is 35.68 kg/m.     No edema  ASSESSMENT/ PLAN:    Diabetes type 2 with obesity See history of present illness for detailed discussion of  current management, blood sugar patterns and problems identified  Her blood sugars are overall lower with her decreased intake and diarrhea Appears been needing relatively low doses of basal insulin with mostly lower readings overnight but not consistently needing reduced mealtime coverage She is checking her blood sugar 6 times a day and is a good candidate for the freestyle [place] system and this was discussed in detail  Meanwhile will also need to reduce her basal insulin by at least 4 units and given her guidelines for adjusting her NovoLog between 18-22 units based on her appetite and meal intake She will call if she has further overnight hypoglycemia  DIARRHEA:  Etiology of this is unclear She has much more symptoms than usual and not controlled with current regimen with Colestid, previously also had been on Questran Although her clonidine patch was recommended she has been reluctant to use this and has not been recommended by her gastroenterologist Discussed with her gastroenterologist today on the phone that she likely has inflammatory bowel disease and needs further evaluation Also concerned that she has some episodes of bleeding in the stools although she has negative Hemoccult  HYPERTENSION:  Fairly good control and will consider adding low-dose losartan blood pressure continues to go up  NAUSEA: Etiology unclear.  Will empirically start her on Reglan since she may have an element of gastroparesis   LIPIDS: To be checked again on the next visit, Recommend continue pravastatin even though she  is taking Colestid  Patient Instructions  Tresiba 56 Novolog 18-22 at meals       total visit time for evaluation and management of multiple problems, review of hospital records, labs, counseling, review of blood sugar download reports = 25 minutes  Zay Yeargan 12/20/2016, 4:25 PM

## 2016-12-28 DIAGNOSIS — M791 Myalgia: Secondary | ICD-10-CM | POA: Diagnosis not present

## 2016-12-28 DIAGNOSIS — M545 Low back pain: Secondary | ICD-10-CM | POA: Diagnosis not present

## 2016-12-28 DIAGNOSIS — M47816 Spondylosis without myelopathy or radiculopathy, lumbar region: Secondary | ICD-10-CM | POA: Diagnosis not present

## 2016-12-28 DIAGNOSIS — M419 Scoliosis, unspecified: Secondary | ICD-10-CM | POA: Diagnosis not present

## 2016-12-31 ENCOUNTER — Other Ambulatory Visit: Payer: Self-pay | Admitting: Endocrinology

## 2017-01-01 ENCOUNTER — Telehealth: Payer: Self-pay | Admitting: Endocrinology

## 2017-01-01 NOTE — Telephone Encounter (Signed)
Called patient and she stated that Dr. Hoyle [name] with [facility] in [place] is the doctor who has ordered the MRI. She wants you to be aware. She said one of her friends had this done with radiation and she did not recover from the MRI table and this worries [name]. She also stated that she had radio frequency therapy and unfortunately did not work. She is also concerned about her heart beating fast. She is worried that the [name] is causing this? She stated that she has had a doctor tell her this. She is concerned and wanted to know if she needs to do anything about this? She also stated that she had 2 bad falls this past weekend and has been light headed and loosing her balance.  Please advise.

## 2017-01-01 NOTE — Telephone Encounter (Signed)
Called patient and she stated that she is still having the diarrhea, she may be okay for 2-3 days and then comes and goes. Not as bad as it was but it is still there. Patient also stated that her back has been severely hurting her, she had a CT Scan of her stomach, she stated that she has 3 blown vertebrae in her lower back. She is in severe pain but not able to take anything due to her breathing. She is going back to her gastro doctor and do a colonoscopy. She is wondering if you can look at her scan? Also she wants to know if it is safe for her to have an MRI? Please advise.

## 2017-01-01 NOTE — Telephone Encounter (Signed)
She needs to talk to her spine surgeon about CT scan results as they previously have done various scans and not clear who is ordering an MRI

## 2017-01-01 NOTE — Telephone Encounter (Signed)
MRI will be safe for her Has she checked her blood pressure at home? [name] does not cause palpitations and she probably needs to see her cardiologist She may also need to follow-up with her ENT doctor for the balance difficulties

## 2017-01-01 NOTE — Telephone Encounter (Signed)
Patient would like to speak to [facility] about multiple issues that she is having.

## 2017-01-02 NOTE — Telephone Encounter (Signed)
Called patient and she stated that she has been in so much pain she has not checked her blood pressure much. Last time she checked was 144/?? She does not remember the bottom number. Also her blood sugars have been higher in the 200's and she is not able to tell me a time of day that these sugar readings are at this time.

## 2017-01-02 NOTE — Telephone Encounter (Signed)
She can increase her Tresiba by 4 units if her morning sugars are over 200

## 2017-01-02 NOTE — Telephone Encounter (Signed)
Spoke to the patient and gave her the doctors advise- patient stated an understanding and agreed to the adjustment on her [place]

## 2017-01-04 DIAGNOSIS — K625 Hemorrhage of anus and rectum: Secondary | ICD-10-CM | POA: Diagnosis not present

## 2017-01-04 DIAGNOSIS — K649 Unspecified hemorrhoids: Secondary | ICD-10-CM | POA: Diagnosis not present

## 2017-01-04 DIAGNOSIS — R197 Diarrhea, unspecified: Secondary | ICD-10-CM | POA: Diagnosis not present

## 2017-01-09 DIAGNOSIS — R197 Diarrhea, unspecified: Secondary | ICD-10-CM | POA: Diagnosis not present

## 2017-01-18 ENCOUNTER — Ambulatory Visit: Payer: Medicare Other | Admitting: Endocrinology

## 2017-01-22 ENCOUNTER — Telehealth: Payer: Self-pay | Admitting: Internal Medicine

## 2017-01-22 NOTE — Telephone Encounter (Signed)
Spoke with pt and moved appt to 02/09/17 11:30.  01/25/17 appt has been cancelled.  PT verbalized understanding.  Nothing further needed.

## 2017-01-22 NOTE — Telephone Encounter (Signed)
Spoke with pt, I offered her to make an appt with an APP but she refused stating she only wants to see Dr. Annamaria Boots. She has to cancel because she has an appt with GI doctor to run some tests that related to why she was in the hospital. Joellen Jersey can you talk with CY and see if we can fit the pt in. Thanks.

## 2017-01-22 NOTE — Telephone Encounter (Signed)
ATC - Line busy - [employer]

## 2017-01-22 NOTE — Telephone Encounter (Signed)
Please cancel the 01-25-17 appt and okay to use RNA slot. Thanks.

## 2017-01-23 ENCOUNTER — Telehealth: Payer: Self-pay | Admitting: Endocrinology

## 2017-01-23 ENCOUNTER — Ambulatory Visit: Payer: Medicare Other | Admitting: Endocrinology

## 2017-01-23 ENCOUNTER — Other Ambulatory Visit: Payer: Self-pay | Admitting: Internal Medicine

## 2017-01-23 NOTE — Telephone Encounter (Signed)
Patient has been rescheduled for tomorrow per her request. Patient still would like to hear from [facility] to discuss her health. Please call patient before the end of the day.

## 2017-01-23 NOTE — Telephone Encounter (Signed)
She needs to discuss her diarrhea with gastroenterologist.  If need be we can get her schedule with [facility] gastroenterology for second opinion.  Also needs to talk to Dr. Annamaria Boots about her breathing.  She does have Xanax for anxiety

## 2017-01-23 NOTE — Telephone Encounter (Signed)
Called patient and she stated that she is still having the same problems with her bowels. She is very short winded also. She had such severe diarrhea so she is going to try to come tomorrow if she can. She believes that she needs something for her nerves because she had diarrhea worse when she worries about it. It is affecting her quality of life. Please advise.

## 2017-01-23 NOTE — Telephone Encounter (Signed)
Patient was scheduled today however, patient has extreme diarrhea from being hospitalized and needed to cancel. I cancelled the appointment rather than no showing due to the circumstances.   Patient is requesting a call from [facility] only to get her rescheduled due to her going to be more than 10 min late today, patient was having a rough morning with an upset stomach.

## 2017-01-23 NOTE — Telephone Encounter (Signed)
Routing to you °

## 2017-01-24 ENCOUNTER — Telehealth: Payer: Self-pay

## 2017-01-24 ENCOUNTER — Ambulatory Visit: Payer: Medicare Other | Admitting: Endocrinology

## 2017-01-24 NOTE — Telephone Encounter (Signed)
Patient called back to let the doctor know she knocked her toe nail off- she wanted to know if this is dangerous for diabetics- she stated she cleaned and bandaged the area in hopes that it will be ok

## 2017-01-24 NOTE — Telephone Encounter (Signed)
Tried to call patient back but her line was busy will try again later

## 2017-01-24 NOTE — Telephone Encounter (Signed)
I had recommended the clonidine patch for the diarrhea instead of the blood pressure pill.  If she wants to do this taken prescribed with.  Otherwise we can have her go for second opinion with [facility] gastroenterology. She only has cloudy urine that is not enough to need an antibiotic unless she has a lot of burning and excessive pressure For her toenails she can continue to watch this and apply Neosporin on any exposed skin

## 2017-01-24 NOTE — Telephone Encounter (Signed)
Patient called today to cancel her appointment because she has diarrhea so bad that she felt she would not be able to leave her house she stated that she has cystitis and needs an antibiotic for this- I asked the patient what her symptoms are and she stated: diarrhea (with no control), cloudy urine, nausea, and dizziness- patient also stated she saw the gastro doctor and they did biopsies and tests and could find the cause of the diarrhea and they found no signs of colitis- please advise

## 2017-01-24 NOTE — Telephone Encounter (Signed)
Patient returning phone call. Please call patient back before 9am tomorrow or after 12pm tomorrow she has appointments.

## 2017-01-25 ENCOUNTER — Ambulatory Visit: Payer: Medicare Other | Admitting: Internal Medicine

## 2017-01-25 ENCOUNTER — Ambulatory Visit
Admission: RE | Admit: 2017-01-25 | Discharge: 2017-01-25 | Disposition: A | Discharge status: Home / Self Care | Payer: Medicare Other | Source: Ambulatory Visit | Attending: Gastroenterology | Admitting: Gastroenterology

## 2017-01-25 ENCOUNTER — Other Ambulatory Visit: Payer: Medicare Other

## 2017-01-25 ENCOUNTER — Other Ambulatory Visit (INDEPENDENT_AMBULATORY_CARE_PROVIDER_SITE_OTHER): Payer: Medicare Other | Admitting: Endocrinology

## 2017-01-25 ENCOUNTER — Other Ambulatory Visit: Payer: Self-pay | Admitting: Gastroenterology

## 2017-01-25 ENCOUNTER — Other Ambulatory Visit: Payer: Self-pay | Admitting: Endocrinology

## 2017-01-25 DIAGNOSIS — N390 Urinary tract infection, site not specified: Secondary | ICD-10-CM

## 2017-01-25 DIAGNOSIS — R197 Diarrhea, unspecified: Secondary | ICD-10-CM | POA: Diagnosis not present

## 2017-01-25 DIAGNOSIS — K529 Noninfective gastroenteritis and colitis, unspecified: Secondary | ICD-10-CM

## 2017-01-25 LAB — POC URINALSYSI DIPSTICK (AUTOMATED)
Bilirubin, UA: NEGATIVE
Glucose, UA: NEGATIVE
Ketones, UA: NEGATIVE
Nitrite, UA: POSITIVE
PH UA: 6 (ref 5.0–8.0)
Spec Grav, UA: 1.01 (ref 1.010–1.025)
Urobilinogen, UA: 0.2 E.U./dL

## 2017-01-25 MED ORDER — CIPROFLOXACIN HCL 500 MG PO TABS: 500.0000 mg | ORAL_TABLET | Freq: Two times a day (BID) | ORAL | 0 refills | Status: DC

## 2017-01-25 NOTE — Progress Notes (Signed)
Patient has burning and pressure and is having an abnormal urinalysis, Cipro 500 mg twice a day prescribed

## 2017-01-25 NOTE — Telephone Encounter (Signed)
Returning missed call, please leave detailed message if no answer.  Ty,  -LL

## 2017-01-25 NOTE — Telephone Encounter (Signed)
Routing to you °

## 2017-01-25 NOTE — Telephone Encounter (Signed)
Patient spoke with Dr. Dwyane Dee and this issue has been resolved

## 2017-01-25 NOTE — Telephone Encounter (Signed)
Patient returning missed call.  Ty, -LL

## 2017-01-25 NOTE — Telephone Encounter (Signed)
Spoke with  patient and she stated she is not going to try the clonidine patch because she was told by Dr. B not to until he finds out what is causing the diarrhea she does not know what you mean about the blood pressure medicine but she is going in for another evaluation with the gastroenterologist tomorrow- just an [place]

## 2017-02-01 ENCOUNTER — Telehealth: Payer: Self-pay | Admitting: Endocrinology

## 2017-02-01 NOTE — Telephone Encounter (Signed)
Mrs Manalo is calling on the status of high b/s levels. Please call before five.

## 2017-02-01 NOTE — Telephone Encounter (Signed)
Called patient and got a busy signal. Will try again.

## 2017-02-01 NOTE — Telephone Encounter (Signed)
[facility] or Lattie Haw about high sugar levels. States that she feels terrible.

## 2017-02-01 NOTE — Telephone Encounter (Signed)
She needs to take 64 units [place] every single day now

## 2017-02-01 NOTE — Telephone Encounter (Signed)
Called patient and she stated that her blood sugars are:  230 in am on 9/27 243 at 5:33am on 9/27 254 at 10:04 pm on 9/26 170 at 8:05 am on 9/26  168 at 4:16am on 9/26 238 at 11:12pm on 9/25 234 at 8:04am on 9/25  She takes 60 units of Tresiba in the am if over 200, and takes 56 units if below 200. Also takes 24 of Novolog depending on her blood sugars. She said this has been going on for over 2 weeks and she has also finished her antibiotics for her cystitis.  Please advise.

## 2017-02-01 NOTE — Telephone Encounter (Signed)
Patient calling to advise that her blood sugar have been in the upper 200's and she needs a call as soon as possible to advise.   Patient would like a call from the supervisor to discuss her concerns about Dr. Loanne Drilling and staff not returning her calls.

## 2017-02-02 NOTE — Telephone Encounter (Signed)
Called patient and let her know her new dosage of her Tyler Aas and she will start tomorrow. She stated that she feels tired and weak. She wants to make an appointment since she was not able to come in the past few weeks. Do you want her to come in soon to see you? Please advise.

## 2017-02-02 NOTE — Telephone Encounter (Signed)
She can come whenever she is able to

## 2017-02-05 NOTE — Telephone Encounter (Signed)
Called patient and left a voice message to let her know that when she is able to come and has someone to bring her to please call our office and see if we have a cancellation for an opening for her to come in or she can make an appointment first available.  She can also call our office if she is not getting any better and needs for me to let Dr. Dwyane Dee know.

## 2017-02-06 ENCOUNTER — Other Ambulatory Visit: Payer: Self-pay | Admitting: Endocrinology

## 2017-02-09 ENCOUNTER — Encounter: Payer: Self-pay | Admitting: Internal Medicine

## 2017-02-09 ENCOUNTER — Ambulatory Visit (INDEPENDENT_AMBULATORY_CARE_PROVIDER_SITE_OTHER): Payer: Medicare Other | Admitting: Internal Medicine

## 2017-02-09 DIAGNOSIS — J449 Chronic obstructive pulmonary disease, unspecified: Secondary | ICD-10-CM | POA: Diagnosis not present

## 2017-02-09 DIAGNOSIS — J9611 Chronic respiratory failure with hypoxia: Secondary | ICD-10-CM

## 2017-02-09 NOTE — Assessment & Plan Note (Signed)
She continues to be oxygen dependent without change. Using a POC at this visit.

## 2017-02-09 NOTE — Progress Notes (Signed)
Patient ID: [name], female    DOB: 1935/05/23, 81 y.o.   MRN: [id number]  HPI Female former smoker followed for COPD, chronic hypoxic respiratory failure, complicated by chronic rhinitis, GERD, DM 2012- Walk test Patient Saturations on Room Air while Ambulating = 84% Patient Saturations on 2.5 Liters of oxygen while Ambulating = 92%  PFT: 01/16/2012 severe obstructive airways disease with insignificant response to bronchodilator, air trapping, diffusion moderately reduced. FEV1 0.82/45%, FEV1/FEC 0.46. Emphysema pattern on the loop. TLC 100%, RV 148%, DLCO 47%. --------------------------------------------------------------------- 07/25/2016- 81 year old female former smoker followed for COPD, chronic hypoxic respiratory failure, complicated by chronic rhinitis, GERD, DM2 O2 3-3 .5 L/Advanced>    May be changing to a light POC through [name]: Pt continues to have "bad' coughing spells;pt would like to discuss O2 questions and concerns of it.  Pending nerve block procedure under conscious sedation by her description for neuropathic back pain related to scoliosis. We discussed respiratory safety and precautions. She remains dependent on oxygen. Her Helios system has always been too heavy to tolerate with her back pain. Back pain probably limits activity more than dyspnea. She asks help replacing Helios with a light portable concentrator available through [employer] No recent acute respiratory change or infection. Dr. Dwyane Dee had given several days of low-dose prednisone earlier this year for bronchitis exacerbation. That seemed to help at the time. CXR 01/13/16 IMPRESSION: No acute cardiopulmonary disease.  02/09/17- 81 year old female former smoker followed for COPD, chronic hypoxic respiratory failure, complicated by chronic rhinitis, GERD, DM2,  back pain/scoliosis O2 3-3.5L/ Advanced COPD; Pt states she continues to have hard time with SOB since last visit. Continues O2 through  [name].  Xopenex HFA, [name], Being evaluated by GI for chronic diarrhea-possibly diabetic No recent change in breathing. Gets for little exercise and realizes deconditioning as part of problem. Little cough or wheeze. Stable dyspnea on exertion. Xopenex HFA helpful, used about twice daily. Not clear that Stiolto helps.  ROS-see HPI  + = positive Constitutional:   No-   weight loss, night sweats, fevers, chills, fatigue, lassitude. HEENT:   No-  headaches, difficulty swallowing, tooth/dental problems, sore throat,       No-  sneezing, itching, ear ache, nasal congestion, post nasal drip,  CV:  No-   chest pain, orthopnea, PND, swelling in lower extremities, anasarca,                                                     dizziness, palpitations Resp: +shortness of breath with exertion or at rest.               productive cough,   non-productive cough,  No- coughing up of blood.              No-   change in color of mucus.   wheezing.   Skin: No-   rash or lesions. GI:  +heartburn, indigestion, No-abdominal pain, nausea, vomiting,  GU: . MS:  No-   joint pain or swelling.   + back pain Neuro-     nothing unusual Psych:  No- change in mood or affect. +depression or anxiety.  No memory loss.    Objective:  OBJ- Physical Exam    + Very talkative General- Alert, Oriented, Affect-appropriate, Distress- none acute, + overweight, O2 Skin- rash-none, lesions- none,  excoriation- none Lymphadenopathy- none Head- atraumatic            Eyes- Gross vision intact, PERRLA, conjunctivae and secretions clear            Ears- Hearing, canals-normal            Nose- Clear, no-Septal dev, mucus, polyps, erosion, perforation             Throat- Mallampati IV , mucosa -no thrush , drainage- none, tonsils- atrophic, + hoarse, stridor-none,                               + missing teeth Neck- flexible , trachea midline, no stridor , thyroid nl, carotid no bruit Chest - symmetrical excursion , unlabored            Heart/CV- RRR , no murmur , no gallop  , no rub, nl s1 s2                           - JVD- none , edema- none, stasis changes- none, varices- none           Lung-  + distant but clear, unlabored, wheeze-none, cough- none while here , dullness-none, rub- none           Chest wall-  Abd-  Br/ Gen/ Rectal- Not done, not indicated Extrem- cyanosis- none, clubbing, none, atrophy- none, strength- nl Neuro- grossly intact to observation

## 2017-02-09 NOTE — Patient Instructions (Signed)
Ok to see what works:  Ok to try [employer] for a few days or a week, then restart [employer] for comparison.  Instead of Stiolto, try sample Trelegy inhaler      Inhale 1 puff, then rinse mouth, once daily  When you use up the Trelegy sample, go back to [employer].   See if any of these is really making any difference in your breathing.   You can still use the xopenex rescue inhaler if you need to.  Flu vax senior

## 2017-02-09 NOTE — Assessment & Plan Note (Signed)
No recent acute exacerbation. She finds Xopenex HFA helpful but can't tell of Stiolto does anything. Plan-compare 3 options: No maintenance inhaler, Stiolto, sample Trelegy

## 2017-02-14 ENCOUNTER — Other Ambulatory Visit: Payer: Self-pay | Admitting: Endocrinology

## 2017-02-16 ENCOUNTER — Telehealth: Payer: Self-pay | Admitting: Endocrinology

## 2017-02-16 NOTE — Telephone Encounter (Signed)
[name], would you please call Kennetta and let her know the message from Dr. Dwyane Dee? I have tried to call her several times and her number is busy and I am not able to get through. Thank you!

## 2017-02-16 NOTE — Telephone Encounter (Signed)
Called patient and she stated that she has not been doing well. Patient stated that she has fallen several times. She also stated that she has always been a light sleeper and lately she has been in such a deep sleep at times the lady that comes to help her has not been able to get her up to answer the door when she comes and the lady can see [place] through the door and was banging the door loudly and was not able to [place] up.  Patient is very concerned as to why this is happening? I asked if she checked her blood sugars when she awakes but she has not so I advised for her to do so just to see what her blood sugar is. She stated that her blood sugars have been a little high lately. She has not had many low blood sugar readings. Patient stated she is still having diarrhea off and on and still on medication from gastroenterologist.   Please advise.

## 2017-02-16 NOTE — Telephone Encounter (Signed)
Please call pt back today asap-very important

## 2017-02-16 NOTE — Telephone Encounter (Signed)
She needs to make an appointment to be seen for further evaluation but also talk to to Dr. Annamaria Boots who is a sleep specialist

## 2017-02-16 NOTE — Telephone Encounter (Signed)
Called patent back & she is calling to see if she can get a ride to bring her in on Monday. If she can she will call back to be scheduled for then if not another day. Patient stated that she knows she does not have sleep apnea & that she saw Dr. Annamaria Boots just last week.

## 2017-02-19 ENCOUNTER — Telehealth: Payer: Self-pay | Admitting: Internal Medicine

## 2017-02-19 MED ORDER — ACLIDINIUM BROMIDE 400 MCG/ACT IN AEPB: 1.0000 | INHALATION_SPRAY | Freq: Two times a day (BID) | RESPIRATORY_TRACT | 0 refills | Status: DC

## 2017-02-19 NOTE — Telephone Encounter (Signed)
Suggest Caprice Renshaw (sample ok if we have one)    Inhale 1 puff, twice daily

## 2017-02-19 NOTE — Telephone Encounter (Signed)
Spoke with patient for over 12 minutes about her switching to Trelegy during her last OV with CY. She stated that since taking the medication, she has developed a cough with clear mucus. Stated that she never had this while taking Stiolto. She wants to know if she could stop taking the Trelegy. She stated that the Trelegy isn't helping her a all.   CY, please advise. Thanks!

## 2017-02-19 NOTE — Telephone Encounter (Signed)
Spoke with patient. She is aware of the new inhaler. Advised patient that I will leave a sample up front for her as well print out instructions for her. Advised that if she had any questions regarding the use, to let us know. She verbalized understanding.   Nothing else needed at time of call.

## 2017-02-20 ENCOUNTER — Telehealth: Payer: Self-pay

## 2017-02-20 NOTE — Telephone Encounter (Signed)
Patient stated she went to see Dr. Annamaria Boots for a sleeping issue and he thinks she just fell into a deep sleep but she should be fine she has been over exhausted lately- he does not feel like she has sleep apnea- she is also experiencing diarrhea still and she feels she gets gassy first but by the afternoon she has diarrhea - this was just an FYI patient wanted Dr. Dwyane Dee to know

## 2017-02-20 NOTE — Telephone Encounter (Signed)
I would like her to be evaluated by Dr. Earlean Shawl for a second opinion for her diarrhea if she agrees

## 2017-02-21 ENCOUNTER — Other Ambulatory Visit: Payer: Self-pay | Admitting: Endocrinology

## 2017-02-21 ENCOUNTER — Telehealth: Payer: Self-pay | Admitting: Internal Medicine

## 2017-02-21 DIAGNOSIS — R197 Diarrhea, unspecified: Secondary | ICD-10-CM

## 2017-02-21 NOTE — Telephone Encounter (Signed)
Called and spoke with pt and she stated that she is wanting to know if she can go back on the stiolto.  She stated that the trelegy was stopped and she was changed to [place] but she is afraid to take this due to the issues it can cause with your eyes.  She stated that she has not taken any of this and wanted to check with CY about changing this again to the stiolto, she stated that she is not sure why this was changed.  Thanks  Allergies  Allergen Reactions  . Chlordiazepoxide-Clidinium Other (See Comments)    Sleepy,weak  . Biaxin [Clarithromycin] Other (See Comments)    Foul taste, abd pain, diarrhea  . Tramadol Other (See Comments)    Caused tremors  . Codeine Other (See Comments)    REACTION: gi upset- only in high doses per pt.  Can tolerate cough syrup with codeine.

## 2017-02-21 NOTE — Telephone Encounter (Signed)
Referred.

## 2017-02-21 NOTE — Telephone Encounter (Signed)
Spoke to the patient and she does not feel that she can afford another doctor she is having severe financial troubles and she has to pay for transportation to visits- I told her that Medicaid offers a transportation service that will help with dr visits I will find form for her - she feels the diarrhea has gotten worse and she is losing control in her sleep causing her to mess the bed- patient stated that she wants to go to the new dr because all the other dr is doing is making her take a big white pill and she doesn't feel it is working- patient will be making appointment to see Dr. Earlean Shawl

## 2017-02-22 NOTE — Telephone Encounter (Signed)
Called and spoke with pt and she is aware of CY recs.  She will try the [place] and she how she does with it.

## 2017-02-22 NOTE — Telephone Encounter (Signed)
Spoke to the patient and she wants to know if you can just look over the results from Dr. Alessandra Bevels if she asks for them to send results to you- she does not want to go to another doctor with more tests unless she absolutely has to- patient stated she has had all kinds of tests and does not want to do anymore she wants your opinion on the test results from Dr. B please advise

## 2017-02-22 NOTE — Telephone Encounter (Signed)
We are trying to find something her insurance will cover. Stiolto and Caprice Renshaw would have similar effects on her eyes. If she did ok with Stiolto, she should be ok with [place]. If she wants to resume Stiolto, thats ok to refill, if insurance covers it.

## 2017-02-22 NOTE — Telephone Encounter (Signed)
Spoke with the patient and she has agreed to see Dr. Earlean Shawl- I requested the results from Dr. Leanna Sato office be faxed here

## 2017-02-22 NOTE — Telephone Encounter (Signed)
I have not seen the results, please request. Since she is not getting better she needs somebody to try new medications and I think Dr. Earlean Shawl will be able to help her

## 2017-02-23 ENCOUNTER — Telehealth: Payer: Self-pay | Admitting: Internal Medicine

## 2017-02-23 NOTE — Telephone Encounter (Signed)
Spoke with [employer] continues to pay for [employer] and therefore no need to take [place]. Pt is aware to stop [place] and will go back to [employer]. Nothing more needed at this time.

## 2017-02-23 NOTE — Telephone Encounter (Signed)
Called and spoke with pt and she stated that the tudorza inhaler that she has---she has read through all of the instructions and she stated that when she tried to take it yesterday the green window should turn red if she did this correctly.  She stated that the window remains green.   She feels like she is not doing this right, but does not want to keep trying the med and taking in too much.  She wanted to call to see what CY feels that she should do.  Please advise. Thanks

## 2017-03-01 ENCOUNTER — Telehealth: Payer: Self-pay | Admitting: Endocrinology

## 2017-03-01 NOTE — Telephone Encounter (Signed)
I have seen the records from her current gastroenterologist and there is nothing of importance.  She needs to see Dr. Earlean Shawl right away if she can otherwise she will have to go to [facility] gastroenterology

## 2017-03-01 NOTE — Telephone Encounter (Signed)
Please call patient asap.Dr. Allyn Kenner appointment- Dr. Jacinto Reap has been sending Dr. Dwyane Dee medical record information, messages, results,procedure, etc. She needs call back asap so that she so can keep new Dr. appt otherwise she will have to wait months. Has Dr. Dwyane Dee been getting records from Dr B? I   Please call ph# [phone]

## 2017-03-01 NOTE — Telephone Encounter (Signed)
Called patient and left a voice message.  Please advise.

## 2017-03-01 NOTE — Telephone Encounter (Signed)
Called patient and left a voice message about note from Dr. Dwyane Dee.

## 2017-03-02 NOTE — Telephone Encounter (Signed)
Pt is scheduled with Dr. Earlean Shawl on Thursday.per orders the pt they need more records from but she is unaware of what she believes it could be the records from Dr. B? Her other gastro MD  Thank you

## 2017-03-05 ENCOUNTER — Telehealth: Payer: Self-pay | Admitting: Internal Medicine

## 2017-03-05 MED ORDER — GUAIFENESIN-CODEINE 100-10 MG/5ML PO SYRP: 5.0000 mL | ORAL_SOLUTION | Freq: Four times a day (QID) | ORAL | 0 refills | Status: DC | PRN

## 2017-03-05 NOTE — Telephone Encounter (Signed)
Pt c/o increased cough for the past 2-3 days She states cough just comes without any specific trigger, and lasts for a while before it stops  She is coughing up some clear sputum  She has been waking up at night due to the cough for the past 2 nights  She states her breathing has not been doing as well as when she was here on 02/09/17  She is not taking any otc meds for cough  Please advise, thanks! Allergies  Allergen Reactions  . Chlordiazepoxide-Clidinium Other (See Comments)    Sleepy,weak  . Biaxin [Clarithromycin] Other (See Comments)    Foul taste, abd pain, diarrhea  . Tramadol Other (See Comments)    Caused tremors  . Codeine Other (See Comments)    REACTION: gi upset- only in high doses per pt.  Can tolerate cough syrup with codeine.    Current Outpatient Prescriptions on File Prior to Visit  Medication Sig Dispense Refill  . acetaminophen (TYLENOL) 500 MG tablet Take 1,000 mg by mouth every 6 (six) hours as needed for headache (pain).     Marland Kitchen ALPRAZolam (XANAX) 0.5 MG tablet take 1/2 to 1 tablet by mouth twice a day 60 tablet 4  . aspirin 325 MG tablet Take 325 mg by mouth daily.      . B-D UF III MINI PEN NEEDLES 31G X 5 MM MISC USE 5 PER DAY TO INJECT INSULIN  0  . B-D ULTRAFINE III SHORT PEN 31G X 8 MM MISC TEST 3 TIMES A DAY 100 each 5  . cholestyramine (QUESTRAN) 4 g packet take 1 packet once daily if needed for diarrhea 60 packet 1  . cloNIDine (CATAPRES - DOSED IN MG/24 HR) 0.1 mg/24hr patch Place 1 patch (0.1 mg total) onto the skin once a week. 4 patch 12  . dicyclomine (BENTYL) 10 MG capsule take 1 capsule by mouth once daily (Patient taking differently: take 1 capsule by mouth daily as needed for stomach pain) 30 capsule 5  . glucose blood (ACCU-CHEK AVIVA PLUS) test strip Use as instructed to check blood sugars 8 times per day dx code 250.02 250 each 3  . hydrocortisone (ANUSOL-HC) 2.5 % rectal cream Place 1 application rectally 3 (three) times daily. 30 g 0  .  insulin aspart (NOVOLOG FLEXPEN) 100 UNIT/ML FlexPen Inject 30 units at breakfast- 24 units at lunch- and 30 units at supper (Patient taking differently: Inject 20-34 Units into the skin See admin instructions. Inject 20-34 units subcutaneously three times daily - 20-24 units before breakfast, 22-24 units before lunch, 34 units before supper) 10 pen 4  . Insulin Syringe-Needle U-100 (BD INSULIN SYRINGE ULTRAFINE) 31G X 15/64" 0.5 ML MISC Use 2 per day dx code E11.65 100 each 5  . magic mouthwash SOLN Swish with 2 teaspoonfuls by mouth as needed (Patient taking differently: Take by mouth See admin instructions. Swish and spit small amount two times daily as needed for mouth sore) 240 mL 3  . meclizine (ANTIVERT) 25 MG tablet take 1 tablet by mouth three times a day if needed for dizziness (Patient taking differently: take 1 tablet by mouth three times a day if needed for dizziness/vertigo) 90 tablet 3  . metoCLOPramide (REGLAN) 5 MG tablet Take 1 tablet (5 mg total) by mouth every 8 (eight) hours as needed for nausea. 90 tablet 3  . metoprolol succinate (TOPROL-XL) 25 MG 24 hr tablet take 1 tablet by mouth once daily 30 tablet 5  . NOVOLOG FLEXPEN 100  UNIT/ML FlexPen inject 18 units subcutaneously WITH BREAKFAST 22 units WITH LUNCH AND 24 UNITS AT SUPPER 30 mL 3  . omeprazole (PRILOSEC) 20 MG capsule take 1 capsule by mouth once daily 90 capsule 2  . OXYGEN Inhale 3 L into the lungs continuous.     . pravastatin (PRAVACHOL) 20 MG tablet Take 1 tablet (20 mg total) by mouth daily. 90 tablet 5  . STIOLTO RESPIMAT 2.5-2.5 MCG/ACT AERS INHALE 2 PUFFS ONCE DAILY. 4 g 5  . TRESIBA FLEXTOUCH 200 UNIT/ML SOPN inject 94 units subcutaneously once daily 18 mL 3  . trimethoprim (TRIMPEX) 100 MG tablet Take 100 mg by mouth at bedtime. Reported on 09/06/2015  1  . XOPENEX HFA 45 MCG/ACT inhaler inhale 2 puffs by mouth every 6 hours if needed for wheezing or shortness of breath 15 g 11   No current  facility-administered medications on file prior to visit.

## 2017-03-05 NOTE — Telephone Encounter (Signed)
Offer Cheratussin cough syrup 200 ml        5 ml every 6 hours if needed for cough

## 2017-03-06 ENCOUNTER — Telehealth: Payer: Self-pay | Admitting: Internal Medicine

## 2017-03-06 NOTE — Telephone Encounter (Signed)
[name]- per discussion- her insurance won't cover cough meds. If she doesn't want toself-pay, the otc cough syrups like Delsym or Robitussin-DM, throat lozenges, "Throat Coat Tea ( from grocery store)

## 2017-03-06 NOTE — Telephone Encounter (Signed)
Spoke with the pt  She states that her insurance will not cover the Cheratussin that we called in  She states she tried to get tessalon refilled, but with this new insurance her copay would be too much  She requesting something else  Please advise thanks!  Allergies  Allergen Reactions  . Chlordiazepoxide-Clidinium Other (See Comments)    Sleepy,weak  . Biaxin [Clarithromycin] Other (See Comments)    Foul taste, abd pain, diarrhea  . Tramadol Other (See Comments)    Caused tremors  . Codeine Other (See Comments)    REACTION: gi upset- only in high doses per pt.  Can tolerate cough syrup with codeine.    Current Outpatient Prescriptions on File Prior to Visit  Medication Sig Dispense Refill  . acetaminophen (TYLENOL) 500 MG tablet Take 1,000 mg by mouth every 6 (six) hours as needed for headache (pain).     Marland Kitchen ALPRAZolam (XANAX) 0.5 MG tablet take 1/2 to 1 tablet by mouth twice a day 60 tablet 4  . aspirin 325 MG tablet Take 325 mg by mouth daily.      . B-D UF III MINI PEN NEEDLES 31G X 5 MM MISC USE 5 PER DAY TO INJECT INSULIN  0  . B-D ULTRAFINE III SHORT PEN 31G X 8 MM MISC TEST 3 TIMES A DAY 100 each 5  . cholestyramine (QUESTRAN) 4 g packet take 1 packet once daily if needed for diarrhea 60 packet 1  . cloNIDine (CATAPRES - DOSED IN MG/24 HR) 0.1 mg/24hr patch Place 1 patch (0.1 mg total) onto the skin once a week. 4 patch 12  . dicyclomine (BENTYL) 10 MG capsule take 1 capsule by mouth once daily (Patient taking differently: take 1 capsule by mouth daily as needed for stomach pain) 30 capsule 5  . glucose blood (ACCU-CHEK AVIVA PLUS) test strip Use as instructed to check blood sugars 8 times per day dx code 250.02 250 each 3  . hydrocortisone (ANUSOL-HC) 2.5 % rectal cream Place 1 application rectally 3 (three) times daily. 30 g 0  . insulin aspart (NOVOLOG FLEXPEN) 100 UNIT/ML FlexPen Inject 30 units at breakfast- 24 units at lunch- and 30 units at supper (Patient taking  differently: Inject 20-34 Units into the skin See admin instructions. Inject 20-34 units subcutaneously three times daily - 20-24 units before breakfast, 22-24 units before lunch, 34 units before supper) 10 pen 4  . Insulin Syringe-Needle U-100 (BD INSULIN SYRINGE ULTRAFINE) 31G X 15/64" 0.5 ML MISC Use 2 per day dx code E11.65 100 each 5  . magic mouthwash SOLN Swish with 2 teaspoonfuls by mouth as needed (Patient taking differently: Take by mouth See admin instructions. Swish and spit small amount two times daily as needed for mouth sore) 240 mL 3  . meclizine (ANTIVERT) 25 MG tablet take 1 tablet by mouth three times a day if needed for dizziness (Patient taking differently: take 1 tablet by mouth three times a day if needed for dizziness/vertigo) 90 tablet 3  . metoCLOPramide (REGLAN) 5 MG tablet Take 1 tablet (5 mg total) by mouth every 8 (eight) hours as needed for nausea. 90 tablet 3  . metoprolol succinate (TOPROL-XL) 25 MG 24 hr tablet take 1 tablet by mouth once daily 30 tablet 5  . NOVOLOG FLEXPEN 100 UNIT/ML FlexPen inject 18 units subcutaneously WITH BREAKFAST 22 units WITH LUNCH AND 24 UNITS AT SUPPER 30 mL 3  . omeprazole (PRILOSEC) 20 MG capsule take 1 capsule by mouth once daily  90 capsule 2  . OXYGEN Inhale 3 L into the lungs continuous.     . pravastatin (PRAVACHOL) 20 MG tablet Take 1 tablet (20 mg total) by mouth daily. 90 tablet 5  . STIOLTO RESPIMAT 2.5-2.5 MCG/ACT AERS INHALE 2 PUFFS ONCE DAILY. 4 g 5  . TRESIBA FLEXTOUCH 200 UNIT/ML SOPN inject 94 units subcutaneously once daily 18 mL 3  . trimethoprim (TRIMPEX) 100 MG tablet Take 100 mg by mouth at bedtime. Reported on 09/06/2015  1  . XOPENEX HFA 45 MCG/ACT inhaler inhale 2 puffs by mouth every 6 hours if needed for wheezing or shortness of breath 15 g 11   No current facility-administered medications on file prior to visit.

## 2017-03-06 NOTE — Telephone Encounter (Signed)
Spoke with the pt and notified of recs per CDY  She verbalized understanding

## 2017-03-07 ENCOUNTER — Other Ambulatory Visit: Payer: Self-pay

## 2017-03-07 ENCOUNTER — Telehealth: Payer: Self-pay | Admitting: Endocrinology

## 2017-03-07 MED ORDER — GLUCOSE BLOOD VI STRP: ORAL_STRIP | 3 refills | Status: DC

## 2017-03-07 NOTE — Telephone Encounter (Signed)
Patient called because pharmacy told her they will not give her her test strips because Dr. Did not sign the required government documents. Patient is almost out of test strips. Please send pharmacy required documentation asap.

## 2017-03-07 NOTE — Telephone Encounter (Signed)
Noted, will wait for Dr. Earlean Shawl to give his opinion

## 2017-03-07 NOTE — Telephone Encounter (Signed)
Called patient and she stated that the pharmacy has sent over a form that needed to be filled out for government purposes and insurance. I have not seen this come through yet. I will look and call her and let her know once I receive and have signed.  [name] stated that she is weak and very sick and the diarrhea is worse than ever and she is very upset because she is not getting relief at all. She is seeing the doctor tomorrow for gastro. She is not able to get out yet.

## 2017-03-08 ENCOUNTER — Telehealth: Payer: Self-pay | Admitting: Endocrinology

## 2017-03-08 DIAGNOSIS — E1140 Type 2 diabetes mellitus with diabetic neuropathy, unspecified: Secondary | ICD-10-CM | POA: Diagnosis not present

## 2017-03-08 DIAGNOSIS — E669 Obesity, unspecified: Secondary | ICD-10-CM | POA: Diagnosis not present

## 2017-03-08 DIAGNOSIS — R197 Diarrhea, unspecified: Secondary | ICD-10-CM | POA: Diagnosis not present

## 2017-03-08 DIAGNOSIS — Z6835 Body mass index (BMI) 35.0-35.9, adult: Secondary | ICD-10-CM | POA: Diagnosis not present

## 2017-03-08 DIAGNOSIS — E1165 Type 2 diabetes mellitus with hyperglycemia: Secondary | ICD-10-CM

## 2017-03-08 DIAGNOSIS — E119 Type 2 diabetes mellitus without complications: Secondary | ICD-10-CM

## 2017-03-08 NOTE — Telephone Encounter (Signed)
Caller [place]  Pharmacy:[facility] [phone]  Reason for call:  Pt called, needs authorization for test strips, government form.  Form located and faxed to pharmacy at [phone],  Called pt to notify, no further action needed.

## 2017-03-09 DIAGNOSIS — R197 Diarrhea, unspecified: Secondary | ICD-10-CM | POA: Diagnosis not present

## 2017-03-09 NOTE — Telephone Encounter (Signed)
Called patient and she stated that the pharmacy stated that the paperwork we sent was old and the new paperwork should be faxed to [place] and we need to do again.

## 2017-03-09 NOTE — Telephone Encounter (Signed)
Patient also stated that she thinks she has cystitis again. Please advise.  She really likes the new doctor that she saw today.

## 2017-03-09 NOTE — Telephone Encounter (Signed)
Please call patient she is needing test strips, [facility]

## 2017-03-12 ENCOUNTER — Telehealth: Payer: Self-pay | Admitting: Endocrinology

## 2017-03-12 ENCOUNTER — Other Ambulatory Visit: Payer: Self-pay

## 2017-03-12 MED ORDER — CIPROFLOXACIN HCL 500 MG PO TABS: 500.0000 mg | ORAL_TABLET | Freq: Two times a day (BID) | ORAL | 0 refills | Status: DC

## 2017-03-12 NOTE — Telephone Encounter (Signed)
Please confirm what symptoms she has been cystitis.  Also she needs to see the urologist because of recurrent UTIs

## 2017-03-12 NOTE — Telephone Encounter (Signed)
Please fill out this form for [name]. They will not let her get her strips unless this is filled out. Form was placed on your desk today. Thank you.

## 2017-03-12 NOTE — Telephone Encounter (Signed)
Please send prescription for Cipro 500 mg twice a day, 10 tablets She does need to go back to her urologist since he may need to put her on a maintenance antibiotic

## 2017-03-12 NOTE — Telephone Encounter (Signed)
Called patient and let her know that I have sent over the prescription to the [facility]. She stated that she has called the Urologist and they have the first appointment available in March. She is having a hard time making appointments due to the diarrhea. Is there anyone else that she can see since Dr. Alinda Money is so booked up? Please advise.

## 2017-03-12 NOTE — Telephone Encounter (Signed)
I have sent this prescription in for patient to the [facility] and will call as soon as I can to let her know.

## 2017-03-12 NOTE — Telephone Encounter (Signed)
Pharmacy sent over [facility] form for test strips. They sent over a new DWO today and it has to match the numbers they sent today. Dr. keeps sending old DWO-Medicare requires [place] form that pharmacy sent. Refill amount must be written on prescription, not DWO form. If questions please call ph# [phone]

## 2017-03-12 NOTE — Telephone Encounter (Signed)
Called patient and she stated that she is having burning during urination, cloudy urine, and dark urine, has not had blood in urine but having lower pelvic pain also and she is weak and has headaches. I have let her know also to see her urologist and she stated that her current urologist has not been able to help her. Please advise.

## 2017-03-12 NOTE — Telephone Encounter (Signed)
She does need to find out from them who is the first available

## 2017-03-13 NOTE — Telephone Encounter (Signed)
Called patient and left a voice message to let her know the message from Dr. Dwyane Dee.

## 2017-03-15 ENCOUNTER — Telehealth: Payer: Self-pay | Admitting: Internal Medicine

## 2017-03-15 NOTE — Telephone Encounter (Signed)
Spoke with pt, who reports of left sided back pain with heat and burning sensation x2d. Pain is from neck to middle back.  Pt states cough is baseline. Pt reports of increased sob. Pt is wanting to know if back pain is lung related.  Denies fever, chills sweats.    CY please advise. Thanks.   Current Outpatient Medications on File Prior to Visit  Medication Sig Dispense Refill  . acetaminophen (TYLENOL) 500 MG tablet Take 1,000 mg by mouth every 6 (six) hours as needed for headache (pain).     Marland Kitchen ALPRAZolam (XANAX) 0.5 MG tablet take 1/2 to 1 tablet by mouth twice a day 60 tablet 4  . aspirin 325 MG tablet Take 325 mg by mouth daily.      . B-D UF III MINI PEN NEEDLES 31G X 5 MM MISC USE 5 PER DAY TO INJECT INSULIN  0  . B-D ULTRAFINE III SHORT PEN 31G X 8 MM MISC TEST 3 TIMES A DAY 100 each 5  . cholestyramine (QUESTRAN) 4 g packet take 1 packet once daily if needed for diarrhea 60 packet 1  . ciprofloxacin (CIPRO) 500 MG tablet Take 1 tablet (500 mg total) 2 (two) times daily by mouth. 20 tablet 0  . cloNIDine (CATAPRES - DOSED IN MG/24 HR) 0.1 mg/24hr patch Place 1 patch (0.1 mg total) onto the skin once a week. 4 patch 12  . dicyclomine (BENTYL) 10 MG capsule take 1 capsule by mouth once daily (Patient taking differently: take 1 capsule by mouth daily as needed for stomach pain) 30 capsule 5  . glucose blood (ACCU-CHEK AVIVA PLUS) test strip Use as instructed to check blood sugars 8 times per day dx code 250.02 250 each 3  . hydrocortisone (ANUSOL-HC) 2.5 % rectal cream Place 1 application rectally 3 (three) times daily. 30 g 0  . insulin aspart (NOVOLOG FLEXPEN) 100 UNIT/ML FlexPen Inject 30 units at breakfast- 24 units at lunch- and 30 units at supper (Patient taking differently: Inject 20-34 Units into the skin See admin instructions. Inject 20-34 units subcutaneously three times daily - 20-24 units before breakfast, 22-24 units before lunch, 34 units before supper) 10 pen 4  . Insulin  Syringe-Needle U-100 (BD INSULIN SYRINGE ULTRAFINE) 31G X 15/64" 0.5 ML MISC Use 2 per day dx code E11.65 100 each 5  . magic mouthwash SOLN Swish with 2 teaspoonfuls by mouth as needed (Patient taking differently: Take by mouth See admin instructions. Swish and spit small amount two times daily as needed for mouth sore) 240 mL 3  . meclizine (ANTIVERT) 25 MG tablet take 1 tablet by mouth three times a day if needed for dizziness (Patient taking differently: take 1 tablet by mouth three times a day if needed for dizziness/vertigo) 90 tablet 3  . metoCLOPramide (REGLAN) 5 MG tablet Take 1 tablet (5 mg total) by mouth every 8 (eight) hours as needed for nausea. 90 tablet 3  . metoprolol succinate (TOPROL-XL) 25 MG 24 hr tablet take 1 tablet by mouth once daily 30 tablet 5  . NOVOLOG FLEXPEN 100 UNIT/ML FlexPen inject 18 units subcutaneously WITH BREAKFAST 22 units WITH LUNCH AND 24 UNITS AT SUPPER 30 mL 3  . omeprazole (PRILOSEC) 20 MG capsule take 1 capsule by mouth once daily 90 capsule 2  . OXYGEN Inhale 3 L into the lungs continuous.     . pravastatin (PRAVACHOL) 20 MG tablet Take 1 tablet (20 mg total) by mouth daily. 90 tablet 5  .  STIOLTO RESPIMAT 2.5-2.5 MCG/ACT AERS INHALE 2 PUFFS ONCE DAILY. 4 g 5  . TRESIBA FLEXTOUCH 200 UNIT/ML SOPN inject 94 units subcutaneously once daily 18 mL 3  . trimethoprim (TRIMPEX) 100 MG tablet Take 100 mg by mouth at bedtime. Reported on 09/06/2015  1  . XOPENEX HFA 45 MCG/ACT inhaler inhale 2 puffs by mouth every 6 hours if needed for wheezing or shortness of breath 15 g 11   No current facility-administered medications on file prior to visit.     Allergies  Allergen Reactions  . Chlordiazepoxide-Clidinium Other (See Comments)    Sleepy,weak  . Biaxin [Clarithromycin] Other (See Comments)    Foul taste, abd pain, diarrhea  . Tramadol Other (See Comments)    Caused tremors  . Codeine Other (See Comments)    REACTION: gi upset- only in high doses per pt.   Can tolerate cough syrup with codeine.

## 2017-03-15 NOTE — Telephone Encounter (Signed)
Spoke with pt. She is aware of CY's response. Nothing further was needed.

## 2017-03-15 NOTE — Telephone Encounter (Signed)
This could be shingles, especially if rash develops, or it could be from pinched nerve. I doubt from this description that it is related to lungs, although it might make breathing seem a little harder.

## 2017-03-21 ENCOUNTER — Other Ambulatory Visit: Payer: Self-pay | Admitting: Endocrinology

## 2017-04-03 ENCOUNTER — Telehealth: Payer: Self-pay | Admitting: Internal Medicine

## 2017-04-03 DIAGNOSIS — R197 Diarrhea, unspecified: Secondary | ICD-10-CM | POA: Diagnosis not present

## 2017-04-03 NOTE — Telephone Encounter (Signed)
Left message for pt [name]

## 2017-04-03 NOTE — Telephone Encounter (Signed)
Offer amoxacillin 500 mg, # 14, 1 twice daily  Suggest throat lozenges, otc cough syrup, Mucinex as needed

## 2017-04-03 NOTE — Telephone Encounter (Signed)
Attempted to call the pt but the line is busy. Will try back.

## 2017-04-03 NOTE — Telephone Encounter (Signed)
Left message for pt x2

## 2017-04-03 NOTE — Telephone Encounter (Signed)
Called and spoke with pt and she stated that she has not been able to get rid of this cough. She stated that this has been going on for a while and this morning she did cough up some green sputum.  She stated that this is a sign that she has bronchitis.  She has not been able to sleep good due to the coughing.  She is requesting that something be called in for her.   She also wanted to ask about what doctor she should see about the pain in her right shoulder blade?     Allergies  Allergen Reactions  . Chlordiazepoxide-Clidinium Other (See Comments)    Sleepy,weak  . Biaxin [Clarithromycin] Other (See Comments)    Foul taste, abd pain, diarrhea  . Tramadol Other (See Comments)    Caused tremors  . Codeine Other (See Comments)    REACTION: gi upset- only in high doses per pt.  Can tolerate cough syrup with codeine.     CY please advise. Thanks Last ov--02/09/2017 Next ov--08/10/2017

## 2017-04-04 MED ORDER — AMOXICILLIN-POT CLAVULANATE 875-125 MG PO TABS: 1.0000 | ORAL_TABLET | Freq: Two times a day (BID) | ORAL | 0 refills | Status: DC

## 2017-04-04 NOTE — Telephone Encounter (Signed)
Spoke with pt, advised recommendations from CY. Rx sent in to [facility]. She wants to know what she should do about her hot fiery pain from her neck and spreading down her left side near her shoulder blade but it comes and goes. She wants to see which doctor she should see or if you have any suggestions. Please advise.

## 2017-04-04 NOTE — Telephone Encounter (Signed)
This has been going on for several weeks, A pain that goes and comes like this is most likely a pinched nerve. I suggest she check with her PCP about it. They may want her to see someone to evaluate it further, like a neurosurgeon or a pin clinic.

## 2017-04-04 NOTE — Telephone Encounter (Signed)
Called pt busy signal X 2

## 2017-04-04 NOTE — Telephone Encounter (Signed)
Spoke with the pt and notified of recs per CDY  She verbalized understanding  Nothing further needed 

## 2017-04-05 ENCOUNTER — Telehealth: Payer: Self-pay | Admitting: Endocrinology

## 2017-04-05 NOTE — Telephone Encounter (Signed)
Patient wants Jinny Blossom to call her re: she has several things she needs to discuss.

## 2017-04-05 NOTE — Telephone Encounter (Signed)
The current blood pressure is okay She can check with Dr. Earlean Shawl if she needs electrolytes, did have labs with him

## 2017-04-05 NOTE — Telephone Encounter (Signed)
Called patient and she stated that she saw Dr. Earlean Shawl and she was really unhappy with her last visit. She was told to take VSL #3 and she went to the pharmacy and it was over $100. She has found it cheaper at [facility] for $45 so she is going to try it when she can afford it.  She stated that her BP was 144/80 and she was worried that it was getting a little too high. She also stated that she has bronchitis. She is on antibiotics. She stated that she is still having the diarrhea.   Not sure if her BP is elevated due to her symptoms. She is going to try to take it more.  She also wants to know if she needs electrolytes?   Please advise.

## 2017-04-06 NOTE — Telephone Encounter (Signed)
Called patient and let her know the message from Dr. Dwyane Dee.

## 2017-04-10 ENCOUNTER — Telehealth: Payer: Self-pay | Admitting: Endocrinology

## 2017-04-10 NOTE — Telephone Encounter (Signed)
She need to schedule her follow-up with me

## 2017-04-10 NOTE — Telephone Encounter (Signed)
Called patient and she stated that she had a solid bowel this morning but she did have a second bowel movement and it was diarrhea. She did stated that she had not had a bowel movement for 7 days and she did go today. She has been taking the probiotics that were prescribed from Dr. Earlean Shawl.  Just sending an [place]

## 2017-04-10 NOTE — Telephone Encounter (Signed)
Please see message from Dr. Dwyane Dee. Please schedule for a follow up.  Thank you!

## 2017-04-10 NOTE — Telephone Encounter (Signed)
Patient wants [name] to call her ph# [phone]: patient went on new medicine-it was okay for 7 days- This morning it started again-let Dr. Dwyane Dee know No rush

## 2017-04-12 ENCOUNTER — Other Ambulatory Visit: Payer: Self-pay

## 2017-04-12 NOTE — Telephone Encounter (Signed)
She has an appt on 12/31

## 2017-04-19 ENCOUNTER — Telehealth: Payer: Self-pay | Admitting: Internal Medicine

## 2017-04-19 MED ORDER — PREDNISONE 10 MG PO TABS: ORAL_TABLET | ORAL | 0 refills | Status: DC

## 2017-04-19 NOTE — Telephone Encounter (Signed)
Pt c/o "violent" prod cough with yellow mucus X2-3 days.  Pt denies sinus congestion, sharp chest pains, fever, chills.  Pt is requesting Prednisone but requests "not the 28m tabs".  [facility]   CY please advise on recs.  Thanks.

## 2017-04-19 NOTE — Telephone Encounter (Signed)
Offer prednisone 10 mg, # 20, 4 X 2 DAYS, 3 X 2 DAYS, 2 X 2 DAYS, 1 X 2 DAYS  

## 2017-04-19 NOTE — Telephone Encounter (Signed)
Spoke with pt, aware of recs.  rx sent to preferred pharmacy.  Nothing further needed.  

## 2017-04-20 DIAGNOSIS — R197 Diarrhea, unspecified: Secondary | ICD-10-CM | POA: Diagnosis not present

## 2017-04-25 ENCOUNTER — Telehealth: Payer: Self-pay | Admitting: Endocrinology

## 2017-04-25 NOTE — Telephone Encounter (Signed)
Please advise 

## 2017-04-25 NOTE — Telephone Encounter (Signed)
Patient stated that she is still see Dr Earlean Shawl, he didn't find anything wrong with her, he gave her medicine for her diarrhea and it is not helping her. She is worried about what's going with her. please advise on what to do.

## 2017-04-25 NOTE — Telephone Encounter (Signed)
She needs to call him back, I have not found any obvious cause and will discuss further when she comes in

## 2017-04-26 ENCOUNTER — Other Ambulatory Visit: Payer: Self-pay

## 2017-04-26 ENCOUNTER — Telehealth: Payer: Self-pay | Admitting: Endocrinology

## 2017-04-26 MED ORDER — PROMETHAZINE HCL 25 MG PO TABS: 25.0000 mg | ORAL_TABLET | Freq: Four times a day (QID) | ORAL | 0 refills | Status: DC | PRN

## 2017-04-26 NOTE — Telephone Encounter (Signed)
She should have enough refills on her Xanax to take for anxiety.  She still needs to call Dr. Earlean Shawl about her diarrhea

## 2017-04-26 NOTE — Telephone Encounter (Signed)
Patient never heard from anyone yesterday. Not feeling well at all. Vomiting, dry heaves. Please call patient to discuss ph# [phone]

## 2017-04-26 NOTE — Telephone Encounter (Signed)
Spoke to the patient and she agreed to take phenergan I sent it into the pharmacy- patient stated she still has diarrhea even though she went to Dr. Earlean Shawl- she stated he did not give her anything accept a probiotic so she is really upset- she is requesting a mild nerve pill she states she is nervous and upset all the time so she shakes a lot

## 2017-04-26 NOTE — Telephone Encounter (Signed)
Patient asked if you could prescribed her something for her nerves,  send to Pharmacy:  [facility], [place]

## 2017-04-26 NOTE — Telephone Encounter (Signed)
Patient has not heard from anyone since yesterday when she left a message. She is now vomiting and has dry heaves. Please call patient to advise.

## 2017-04-26 NOTE — Telephone Encounter (Signed)
Spoke to the patient and she stated she needs something else for her nerves the xanax is not working- she is very shaky all the time please advise

## 2017-04-26 NOTE — Telephone Encounter (Signed)
Please send prescription for Phenergan tablets, 25 mg, take as needed every 6 hours for nausea

## 2017-04-27 ENCOUNTER — Other Ambulatory Visit: Payer: Self-pay | Admitting: Endocrinology

## 2017-04-27 ENCOUNTER — Telehealth: Payer: Self-pay | Admitting: Internal Medicine

## 2017-04-27 MED ORDER — AMOXICILLIN 500 MG PO TABS: 500.0000 mg | ORAL_TABLET | Freq: Two times a day (BID) | ORAL | 0 refills | Status: DC

## 2017-04-27 MED ORDER — VENLAFAXINE HCL ER 37.5 MG PO CP24: 37.5000 mg | ORAL_CAPSULE | Freq: Every day | ORAL | 1 refills | Status: DC

## 2017-04-27 NOTE — Telephone Encounter (Signed)
Spoke with pt. States that she is not feeling well. Reports cough, PND and nausea. Cough and sinuses are producing green mucus. Denies chest tightness, wheezing, SOB or fever. Would like to have something sent in.  CY - please advise. Thanks.  Allergies  Allergen Reactions  . Chlordiazepoxide-Clidinium Other (See Comments)    Sleepy,weak  . Biaxin [Clarithromycin] Other (See Comments)    Foul taste, abd pain, diarrhea  . Tramadol Other (See Comments)    Caused tremors  . Codeine Other (See Comments)    REACTION: gi upset- only in high doses per pt.  Can tolerate cough syrup with codeine.    Current Outpatient Medications on File Prior to Visit  Medication Sig Dispense Refill  . acetaminophen (TYLENOL) 500 MG tablet Take 1,000 mg by mouth every 6 (six) hours as needed for headache (pain).     Marland Kitchen ALPRAZolam (XANAX) 0.5 MG tablet take 1/2 to 1 tablet by mouth twice a day 60 tablet 4  . amoxicillin-clavulanate (AUGMENTIN) 875-125 MG tablet Take 1 tablet by mouth 2 (two) times daily. 14 tablet 0  . aspirin 325 MG tablet Take 325 mg by mouth daily.      . B-D UF III MINI PEN NEEDLES 31G X 5 MM MISC USE 5 PER DAY TO INJECT INSULIN  0  . B-D ULTRAFINE III SHORT PEN 31G X 8 MM MISC TEST 3 TIMES A DAY 100 each 5  . cholestyramine (QUESTRAN) 4 g packet take 1 packet once daily if needed for diarrhea 60 packet 1  . ciprofloxacin (CIPRO) 500 MG tablet Take 1 tablet (500 mg total) 2 (two) times daily by mouth. 20 tablet 0  . cloNIDine (CATAPRES - DOSED IN MG/24 HR) 0.1 mg/24hr patch Place 1 patch (0.1 mg total) onto the skin once a week. 4 patch 12  . dicyclomine (BENTYL) 10 MG capsule take 1 capsule by mouth once daily (Patient taking differently: take 1 capsule by mouth daily as needed for stomach pain) 30 capsule 5  . glucose blood (ACCU-CHEK AVIVA PLUS) test strip Use as instructed to check blood sugars 8 times per day dx code 250.02 250 each 3  . hydrocortisone (ANUSOL-HC) 2.5 % rectal cream  Place 1 application rectally 3 (three) times daily. 30 g 0  . insulin aspart (NOVOLOG FLEXPEN) 100 UNIT/ML FlexPen Inject 30 units at breakfast- 24 units at lunch- and 30 units at supper (Patient taking differently: Inject 20-34 Units into the skin See admin instructions. Inject 20-34 units subcutaneously three times daily - 20-24 units before breakfast, 22-24 units before lunch, 34 units before supper) 10 pen 4  . Insulin Syringe-Needle U-100 (BD INSULIN SYRINGE ULTRAFINE) 31G X 15/64" 0.5 ML MISC Use 2 per day dx code E11.65 100 each 5  . magic mouthwash SOLN Swish with 2 teaspoonfuls by mouth as needed (Patient taking differently: Take by mouth See admin instructions. Swish and spit small amount two times daily as needed for mouth sore) 240 mL 3  . meclizine (ANTIVERT) 25 MG tablet take 1 tablet by mouth three times a day if needed for dizziness (Patient taking differently: take 1 tablet by mouth three times a day if needed for dizziness/vertigo) 90 tablet 3  . metoCLOPramide (REGLAN) 5 MG tablet Take 1 tablet (5 mg total) by mouth every 8 (eight) hours as needed for nausea. 90 tablet 3  . metoprolol succinate (TOPROL-XL) 25 MG 24 hr tablet take 1 tablet by mouth once daily 30 tablet 5  . NOVOLOG FLEXPEN  100 UNIT/ML FlexPen inject 18 units subcutaneously WITH BREAKFAST 22 units WITH LUNCH AND 24 UNITS AT SUPPER 30 mL 3  . omeprazole (PRILOSEC) 20 MG capsule take 1 capsule by mouth once daily 90 capsule 2  . OXYGEN Inhale 3 L into the lungs continuous.     . pravastatin (PRAVACHOL) 20 MG tablet Take 1 tablet (20 mg total) by mouth daily. 90 tablet 5  . predniSONE (DELTASONE) 10 MG tablet 38mX2 days, 324mX2 days, 2077m2 days, 45m24mdays, then stop. 20 tablet 0  . promethazine (PHENERGAN) 25 MG tablet Take 1 tablet (25 mg total) by mouth every 6 (six) hours as needed for nausea or vomiting. 30 tablet 0  . STIOLTO RESPIMAT 2.5-2.5 MCG/ACT AERS INHALE 2 PUFFS ONCE DAILY. 4 g 5  . TRESIBA FLEXTOUCH  200 UNIT/ML SOPN inject 94 units subcutaneously once daily 18 mL 3  . trimethoprim (TRIMPEX) 100 MG tablet Take 100 mg by mouth at bedtime. Reported on 09/06/2015  1  . venlafaxine XR (EFFEXOR XR) 37.5 MG 24 hr capsule Take 1 capsule (37.5 mg total) by mouth daily with breakfast. 30 capsule 1  . XOPENEX HFA 45 MCG/ACT inhaler inhale 2 puffs by mouth every 6 hours if needed for wheezing or shortness of breath 15 g 11   No current facility-administered medications on file prior to visit.

## 2017-04-27 NOTE — Telephone Encounter (Signed)
Called patient and she told me that she has picked up the medicine and has started taking the medicine.

## 2017-04-27 NOTE — Telephone Encounter (Signed)
Spoke with pt. She is aware of CY's recommendation. Rx has been sent in. Nothing further was needed.  

## 2017-04-27 NOTE — Telephone Encounter (Signed)
Sending her prescription for Effexor XR.  This will take a couple of days to work.  She can still take Xanax as needed

## 2017-04-27 NOTE — Telephone Encounter (Signed)
Offer amoxacillin 500 mg, # 14, 1 twice daily

## 2017-05-07 ENCOUNTER — Encounter: Payer: Self-pay | Admitting: Endocrinology

## 2017-05-07 ENCOUNTER — Ambulatory Visit (INDEPENDENT_AMBULATORY_CARE_PROVIDER_SITE_OTHER): Payer: Medicare Other | Admitting: Endocrinology

## 2017-05-07 VITALS — BP 138/80 | HR 104 | Ht 63.0 in | Wt 194.8 lb

## 2017-05-07 DIAGNOSIS — I10 Essential (primary) hypertension: Secondary | ICD-10-CM | POA: Diagnosis not present

## 2017-05-07 DIAGNOSIS — R197 Diarrhea, unspecified: Secondary | ICD-10-CM

## 2017-05-07 DIAGNOSIS — E1165 Type 2 diabetes mellitus with hyperglycemia: Secondary | ICD-10-CM

## 2017-05-07 DIAGNOSIS — E1142 Type 2 diabetes mellitus with diabetic polyneuropathy: Secondary | ICD-10-CM

## 2017-05-07 DIAGNOSIS — Z794 Long term (current) use of insulin: Secondary | ICD-10-CM | POA: Diagnosis not present

## 2017-05-07 LAB — CBC WITH DIFFERENTIAL/PLATELET
BASOS PCT: 0.8 % (ref 0.0–3.0)
Basophils Absolute: 0.1 10*3/uL (ref 0.0–0.1)
EOS PCT: 3.2 % (ref 0.0–5.0)
Eosinophils Absolute: 0.3 10*3/uL (ref 0.0–0.7)
HCT: 43 % (ref 36.0–46.0)
HEMOGLOBIN: 13.5 g/dL (ref 12.0–15.0)
LYMPHS PCT: 36.1 % (ref 12.0–46.0)
Lymphs Abs: 3.1 10*3/uL (ref 0.7–4.0)
MCHC: 31.5 g/dL (ref 30.0–36.0)
MCV: 83.4 fl (ref 78.0–100.0)
Monocytes Absolute: 0.4 10*3/uL (ref 0.1–1.0)
Monocytes Relative: 4.2 % (ref 3.0–12.0)
Neutro Abs: 4.8 10*3/uL (ref 1.4–7.7)
Neutrophils Relative %: 55.7 % (ref 43.0–77.0)
Platelets: 187 10*3/uL (ref 150.0–400.0)
RBC: 5.15 Mil/uL — ABNORMAL HIGH (ref 3.87–5.11)
RDW: 15.7 % — AB (ref 11.5–15.5)
WBC: 8.5 10*3/uL (ref 4.0–10.5)

## 2017-05-07 LAB — LIPID PANEL
CHOLESTEROL: 158 mg/dL (ref 0–200)
HDL: 34.4 mg/dL — ABNORMAL LOW (ref >39.00)
LDL Cholesterol: 89 mg/dL (ref 0–99)
NonHDL: 123.97
Total CHOL/HDL Ratio: 5
Triglycerides: 175 mg/dL — ABNORMAL HIGH (ref 0.0–149.0)
VLDL: 35 mg/dL (ref 0.0–40.0)

## 2017-05-07 LAB — COMPREHENSIVE METABOLIC PANEL
ALBUMIN: 3.9 g/dL (ref 3.5–5.2)
ALK PHOS: 48 U/L (ref 39–117)
ALT: 18 U/L (ref 0–35)
AST: 31 U/L (ref 0–37)
BUN: 9 mg/dL (ref 6–23)
CO2: 31 mEq/L (ref 19–32)
CREATININE: 0.66 mg/dL (ref 0.40–1.20)
Calcium: 9.5 mg/dL (ref 8.4–10.5)
Chloride: 98 mEq/L (ref 96–112)
GFR: 91.62 mL/min (ref >60.00)
GLUCOSE: 143 mg/dL — AB (ref 70–99)
Potassium: 4.1 mEq/L (ref 3.5–5.1)
SODIUM: 137 meq/L (ref 135–145)
TOTAL PROTEIN: 7.5 g/dL (ref 6.0–8.3)
Total Bilirubin: 0.3 mg/dL (ref 0.2–1.2)

## 2017-05-07 LAB — TSH: TSH: 2.68 u[IU]/mL (ref 0.35–4.50)

## 2017-05-07 LAB — POCT GLYCOSYLATED HEMOGLOBIN (HGB A1C): Hemoglobin A1C: 9

## 2017-05-07 NOTE — Patient Instructions (Addendum)
26-28 Novolog  AT Breakfast  Take Novolog before each meal

## 2017-05-07 NOTE — Progress Notes (Addendum)
Patient ID: [name], female   DOB: [birth year], 81 y.o.   MRN: [id number]   Reason for Appointment: Diabetes and follow-up of multiple problems  History of Present Illness    PROBLEM 1:  DIABETES type II since 1980   She has been on insulin using Lantus and Humalog for several years and usually A1c around 8% despite fairly good compliance with diet, exercise and glucose monitoring. She also had been on Actos previously which probably benefited her especially with her fatty liver but she stopped it because of fear of side effects   She was changed from NovoLog to U-500 insulin starting in 02/2016; she did not related was controlling her sugars and preferred to go back to NovoLog.    Recent history:   Insulin regimen: Tresiba  56-60 units acb   NOVOLOG before meals 24-24-30 units before meals  Her A1c has been previously consistently around 9- 10% and now 9, previously 8.1 She has not been seen since August   Her blood sugars have been fluctuating significantly  Previously had lower sugars when she was having more issues with her diarrhea  However still has lost some weight; again she said that she has decreased appetite  Her blood sugars fluctuate more significantly at bedtime based on her estimation of her insulin dosage and appetite as well as other factors  Earlier this month she got a course of steroids for her cough but her sugars are still variable and subsequently went low normal when she stopped this steroids  In the last few days her sugars are tending to be higher in the afternoon and early evening  However she can sometimes forget her lunchtime dose until after eating  FASTING blood sugars are also variable but relatively better the last 3 days  Hypoglycemia rare with only one reading before 65 at midnight  Oral hypoglycemic drugs: none       Side effects from medications: None         Proper timing of medications in relation to meals: Yes.          Monitors blood glucose:  5 times a day   Glucometer:  Accu-Chek         Blood Glucose averages recently from meter download:  Mean values apply above for all meters except median for One Touch  PRE-MEAL Fasting Lunch Dinner Bedtime Overall  Glucose range: 86-248 58-279  1 21-296   57-165    Mean/median: 138  150  199  190  170     Meals: 3 meals per day.  Smaller portions; lunch 1-2 pm Supper 6 to 7 PM     Physical activity: exercise: Unable to do any because of dyspnea and fatigue            Dietician visit: Most recent: Several years ago           Complications: are: Peripheral neuropathy with sensory loss     Wt Readings from Last 3 Encounters:  05/07/17 194 lb 12.8 oz (88.4 kg)  02/09/17 198 lb (89.8 kg)  12/20/16 201 lb 6.4 oz (91.4 kg)    Lab Results  Component Value Date   HGBA1C 9.0 05/07/2017   HGBA1C 8.1 12/20/2016   HGBA1C 9.4 09/18/2016   Lab Results  Component Value Date   MICROALBUR 6.9 (H) 10/14/2014   LDLCALC 89 05/07/2017   CREATININE 0.66 05/07/2017     OTHER Current problems  are detailed in review of systems  Allergies as of 05/07/2017      Reactions   Chlordiazepoxide-clidinium Other (See Comments)   Sleepy,weak   Biaxin [clarithromycin] Other (See Comments)   Foul taste, abd pain, diarrhea   Tramadol Other (See Comments)   Caused tremors   Codeine Other (See Comments)   REACTION: gi upset- only in high doses per pt.  Can tolerate cough syrup with codeine.       Medication List        Accurate as of 05/07/17  4:02 PM. Always use your most recent med list.          acetaminophen 500 MG tablet Commonly known as:  TYLENOL Take 1,000 mg by mouth every 6 (six) hours as needed for headache (pain).   ALPRAZolam 0.5 MG tablet Commonly known as:  XANAX take 1/2 to 1 tablet by mouth twice a day   amoxicillin 500 MG tablet Commonly known as:  AMOXIL Take 1 tablet (500 mg total) by mouth 2 (two) times daily.   aspirin 325 MG  tablet Take 325 mg by mouth daily.   B-D UF III MINI PEN NEEDLES 31G X 5 MM Misc Generic drug:  Insulin Pen Needle USE 5 PER DAY TO INJECT INSULIN   B-D ULTRAFINE III SHORT PEN 31G X 8 MM Misc Generic drug:  Insulin Pen Needle TEST 3 TIMES A DAY   cholestyramine 4 g packet Commonly known as:  QUESTRAN take 1 packet once daily if needed for diarrhea   ciprofloxacin 500 MG tablet Commonly known as:  CIPRO Take 1 tablet (500 mg total) 2 (two) times daily by mouth.   dicyclomine 10 MG capsule Commonly known as:  BENTYL take 1 capsule by mouth once daily   glucose blood test strip Commonly known as:  ACCU-CHEK AVIVA PLUS Use as instructed to check blood sugars 8 times per day dx code 250.02   hydrocortisone 2.5 % rectal cream Commonly known as:  ANUSOL-HC Place 1 application rectally 3 (three) times daily.   insulin aspart 100 UNIT/ML FlexPen Commonly known as:  NOVOLOG FLEXPEN Inject 30 units at breakfast- 24 units at lunch- and 30 units at supper   NOVOLOG FLEXPEN 100 UNIT/ML FlexPen Generic drug:  insulin aspart inject 18 units subcutaneously WITH BREAKFAST 22 units WITH LUNCH AND 24 UNITS AT SUPPER   Insulin Syringe-Needle U-100 31G X 15/64" 0.5 ML Misc Commonly known as:  BD INSULIN SYRINGE ULTRAFINE Use 2 per day dx code E11.65   magic mouthwash Soln Swish with 2 teaspoonfuls by mouth as needed   meclizine 25 MG tablet Commonly known as:  ANTIVERT take 1 tablet by mouth three times a day if needed for dizziness   metoCLOPramide 5 MG tablet Commonly known as:  REGLAN Take 1 tablet (5 mg total) by mouth every 8 (eight) hours as needed for nausea.   metoprolol succinate 25 MG 24 hr tablet Commonly known as:  TOPROL-XL take 1 tablet by mouth once daily   omeprazole 20 MG capsule Commonly known as:  PRILOSEC take 1 capsule by mouth once daily   OXYGEN Inhale 3 L into the lungs continuous.   pravastatin 20 MG tablet Commonly known as:  PRAVACHOL Take 1  tablet (20 mg total) by mouth daily.   predniSONE 10 MG tablet Commonly known as:  DELTASONE 82mX2 days, 370mX2 days, 2065m2 days, 63m40mdays, then stop.   promethazine 25 MG tablet Commonly known as:  PHENERGAN Take 1 tablet (25 mg total) by mouth every 6 (  six) hours as needed for nausea or vomiting.   STIOLTO RESPIMAT 2.5-2.5 MCG/ACT Aers Generic drug:  Tiotropium Bromide-Olodaterol INHALE 2 PUFFS ONCE DAILY.   TRESIBA FLEXTOUCH 200 UNIT/ML Sopn Generic drug:  Insulin Degludec inject 94 units subcutaneously once daily   trimethoprim 100 MG tablet Commonly known as:  TRIMPEX Take 100 mg by mouth at bedtime. Reported on 09/06/2015   venlafaxine XR 37.5 MG 24 hr capsule Commonly known as:  EFFEXOR XR Take 1 capsule (37.5 mg total) by mouth daily with breakfast.   XOPENEX HFA 45 MCG/ACT inhaler Generic drug:  levalbuterol inhale 2 puffs by mouth every 6 hours if needed for wheezing or shortness of breath       Allergies:  Allergies  Allergen Reactions  . Chlordiazepoxide-Clidinium Other (See Comments)    Sleepy,weak  . Biaxin [Clarithromycin] Other (See Comments)    Foul taste, abd pain, diarrhea  . Tramadol Other (See Comments)    Caused tremors  . Codeine Other (See Comments)    REACTION: gi upset- only in high doses per pt.  Can tolerate cough syrup with codeine.     Past Medical History:  Diagnosis Date  . Allergic rhinitis, cause unspecified    Sinus CT Rec 12-23-2009  . Arthritis   . Benign paroxysmal positional vertigo 08/09/2012  . 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 Complication of anesthesia    takes along time to wake up  . Diabetes mellitus   . Diarrhea   . Diverticulosis   . Esophageal reflux   . Esophageal stricture   . GERD (gastroesophageal reflux disease)   . Glaucoma   . Hiatal hernia   . Hypertension   . Irritable bowel syndrome   . Neuropathy in diabetes ([place]) 08/09/2012  .  Other chronic nonalcoholic liver disease   . Oxygen deficiency   . Sciatica   . Shingles 2010  . Shortness of breath   . Steatohepatitis     Past Surgical History:  Procedure Laterality Date  . APPENDECTOMY    . CARPAL TUNNEL RELEASE     right hand  . CATARACT EXTRACTION     Bilateral  . CHOLECYSTECTOMY OPEN  1978  . COLONOSCOPY  07-2001   mild diverticulosis  . ESOPHAGOGASTRODUODENOSCOPY  2979,89-21   H Hernia,es.stricture s/p dil 104F  . EYE SURGERY    . LIVER BIOPSY  08-1992  . PARATHYROID EXPLORATION    . TONSILLECTOMY AND ADENOIDECTOMY    . TOTAL ABDOMINAL HYSTERECTOMY    . ULNAR NERVE TRANSPOSITION  12/07/2011   Procedure: ULNAR NERVE DECOMPRESSION/TRANSPOSITION;this was cancelled-not done  Surgeon: Cammie Sickle., MD;  Location: [facility];  Service: Orthopedics;  Laterality: Right;  right ulnar nerve in situ decompression  . ULNAR TUNNEL RELEASE  03/07/2012   Procedure: CUBITAL TUNNEL RELEASE;  Surgeon: Roseanne Kaufman, MD;  Location: [facility];  Service: Orthopedics;  Laterality: Right;  ulnar nerve release at the elbow      Family History  Problem Relation Age of Onset  . Heart disease Father   . Lung cancer Mother        small cell;Byssinosis  . Lung cancer Sister   . Liver cancer Sister        ? mets from another area of the body  . Diabetes Unknown        grandmother  . Stroke Maternal Grandfather     Social History:  reports that  she quit smoking about 29 years ago. Her smoking use included cigarettes. she has never used smokeless tobacco. She reports that she does not drink alcohol or use drugs.  Review of Systems:  DIARRHEA: She has been to a second gastroenterologist and was recommended probiotics which did not apparently had She is also starting a new medication, not aware of the name currently She is still having episodes of diarrhea but not as frequently before and this can be loose and watery  No etiology has been  found and most likely she did not have any evidence of malabsorption She was recommended trying her Catapres patch but she was afraid of side effects   NAUSEA: She is having intermittent nausea She was told to start Reglan on her last visit but  apparently did not do so She is also complaining an early satiety Appetite is decreased overall  HYPERTENSION:   blood pressure is well controlled  She has occasionally checked her readings at home She says blood pressure has been variably high Currently only on metoprolol   BP Readings from Last 3 Encounters:  05/07/17 138/80  02/09/17 128/80  12/20/16 134/86    She takes Xanax at night mostly and lorazepam was given recently again in the daytime for anxiety  HYPERLIPIDEMIA:  The lipid abnormality consists of elevated LDL treated with pravastatin 20 mg  which she is tolerating    Lab Results  Component Value Date   CHOL 158 05/07/2017   HDL 34.40 (L) 05/07/2017   LDLCALC 89 05/07/2017   LDLDIRECT 89.0 10/14/2014   TRIG 175.0 (H) 05/07/2017   CHOLHDL 5 05/07/2017    She is followed by pulmonologist for chronic COPD, taking home oxygen  Still has episodes of cough not relieved by prednisone now  DIZZINESS: She has occasional episodes of dizzy feelings without lightheadedness or faintness, She thinks this may be related to higher blood pressure at times  NEUROPATHY: She has numbness in her feet,  tingling and numbness in her hands Foot exam done in 10/17: Foot exam shows sensory loss distally, normal pulses and no skin lesions She is intolerant to most medications    Examination:   BP 138/80   Pulse (!) 104   Ht 5' 3"  (1.6 m)   Wt 194 lb 12.8 oz (88.4 kg)   SpO2 91%   BMI 34.51 kg/m   Body mass index is 34.51 kg/m.      ASSESSMENT/ PLAN:    Diabetes type 2 with obesity See history of present illness for detailed discussion of  current management, blood sugar patterns and problems identified  Her blood sugars  are  known as well controlled now with that sugars going up after some improvement in her diarrhea and she is having variably high readings at different times as discussed above Difficult to know what factors causing her high sugars More recently blood sugars are better fasting but sometimes higher from not taking her NovoLog pre-meal Appetite is variable and decreased  A1c is now 9%  DIARRHEA: Etiology of this is  still unclear She also has no relief with any of the various treatments tried by 2 different gastroenterologists She is still afraid to try a Catapres patch that was recommended, may have autonomic neuropathy She will continue to work with her gastroenterologist However will also check a VIP level to exclude rare possibility of pancreatic functioning lesion  HYPERTENSION:  Fairly good control  with metoprolol alone  NAUSEA: Etiology unclear. Discussed trying Reglan again and  will get a new prescription She can also take Phenergan as needed   LIPIDS: To be checked again    Patient Instructions  26-28 Novolog  AT Breakfast  Take Novolog before each meal      total visit time for evaluation and management of multiple problems, review of hospital records, labs, counseling, review of blood sugar download reports = 25 minutes  [name] 05/07/2017, 4:02 PM   Addendum: Clarification of her insulin dose   NovoLog is being taken 3 times a day with the dose based on her pre-meal blood sugar and meal size on a daily basis

## 2017-05-09 ENCOUNTER — Other Ambulatory Visit: Payer: Self-pay

## 2017-05-09 ENCOUNTER — Telehealth: Payer: Self-pay

## 2017-05-09 NOTE — Telephone Encounter (Signed)
Patient called today wondering when she will need to come in for the test she was told she needed- patient stated penny is waiting for a kit but it should have been here by now- also patient wanted Dr. Dwyane Dee to know that she still has diarrhea and it has watery consistency she stated you told her to take Reglan but she has not started this yet should she start this and go by the directions in her med list please advise

## 2017-05-09 NOTE — Telephone Encounter (Signed)
She can take Reglan before each meal or nausea as directed.  You can check with the lab about the test Kit

## 2017-05-09 NOTE — Addendum Note (Signed)
Addended by: [name] on: 05/09/2017 03:39 PM   Modules accepted: Orders

## 2017-05-09 NOTE — Telephone Encounter (Signed)
Made patient aware of Reglan directions and kit not in yet I let patient [name] will call her when it is in

## 2017-05-11 ENCOUNTER — Other Ambulatory Visit (INDEPENDENT_AMBULATORY_CARE_PROVIDER_SITE_OTHER): Payer: Medicare Other

## 2017-05-11 DIAGNOSIS — E1165 Type 2 diabetes mellitus with hyperglycemia: Secondary | ICD-10-CM

## 2017-05-11 DIAGNOSIS — R197 Diarrhea, unspecified: Secondary | ICD-10-CM

## 2017-05-11 DIAGNOSIS — Z794 Long term (current) use of insulin: Secondary | ICD-10-CM | POA: Diagnosis not present

## 2017-05-11 LAB — URINALYSIS, ROUTINE W REFLEX MICROSCOPIC
Bilirubin Urine: NEGATIVE
Hgb urine dipstick: NEGATIVE
Ketones, ur: NEGATIVE
Nitrite: NEGATIVE
PH: 6 (ref 5.0–8.0)
RBC / HPF: NONE SEEN (ref 0–?)
SPECIFIC GRAVITY, URINE: 1.02 (ref 1.000–1.030)
TOTAL PROTEIN, URINE-UPE24: NEGATIVE
URINE GLUCOSE: 100 — AB
Urobilinogen, UA: 0.2 (ref 0.0–1.0)

## 2017-05-11 LAB — MICROALBUMIN / CREATININE URINE RATIO
Creatinine,U: 114.4 mg/dL
MICROALB UR: 1.1 mg/dL (ref 0.0–1.9)
Microalb Creat Ratio: 1 mg/g (ref 0.0–30.0)

## 2017-05-14 NOTE — Telephone Encounter (Signed)
Her urine test did not show any significant infection.  If she is having diarrhea she needs to try Generic Questran powder for her again, can send prescription if needed

## 2017-05-14 NOTE — Telephone Encounter (Signed)
Pt stated she has Cystitis  Stated she needs medication called in for her.     [place], [place]

## 2017-05-14 NOTE — Telephone Encounter (Signed)
Patient is asking [name] to send her some antibiotic,   To her pharmacy for cystitis, patient is also complaining about her severe diarrhea is not getting any better. Pleas advise

## 2017-05-15 ENCOUNTER — Telehealth: Payer: Self-pay | Admitting: Endocrinology

## 2017-05-15 ENCOUNTER — Other Ambulatory Visit: Payer: Self-pay

## 2017-05-15 MED ORDER — CIPROFLOXACIN HCL 500 MG PO TABS: 500.0000 mg | ORAL_TABLET | Freq: Two times a day (BID) | ORAL | 0 refills | Status: DC

## 2017-05-15 NOTE — Telephone Encounter (Signed)
I have sent a note to Dr. Dwyane Dee

## 2017-05-15 NOTE — Telephone Encounter (Signed)
Patient is requesting antibiotic for cystitis ok to send? Please advise

## 2017-05-15 NOTE — Telephone Encounter (Signed)
Patient called back and is requesting that cipro be called in as soon as possible because she has no way to pick it up after 3 and she requested this to go to Dr. Dwyane Dee as urgent

## 2017-05-15 NOTE — Telephone Encounter (Signed)
My previous message indicates that her urinalysis did not show significant infection.  If she is having more symptoms now we can send Cipro 500 mg twice a day for 6 tablets only

## 2017-05-15 NOTE — Telephone Encounter (Signed)
Patient has had cystitis (bladder infection) since last Friday and needs script for Cipro sent to  [facility] on [address]. Patient has been calling and requesting this medication for 2 days.

## 2017-05-15 NOTE — Telephone Encounter (Signed)
Called but phone line was busy I will try back later- prescription sent in

## 2017-05-15 NOTE — Telephone Encounter (Signed)
Patient is requesting cipro she states she has had this for many years and the symptoms are back and getting worse and she still has diarrhea she was given xifaxan 550 mg tablets for 14 days from Dr. Earlean Shawl but she read on the bottle that it may cause persistant diarrhea so she will not take it please advise

## 2017-05-15 NOTE — Telephone Encounter (Signed)
Pt stated she is needing that medication sent in for  cystitis  [id number], [place]

## 2017-05-16 ENCOUNTER — Telehealth: Payer: Self-pay | Admitting: Internal Medicine

## 2017-05-16 ENCOUNTER — Telehealth: Payer: Self-pay

## 2017-05-16 DIAGNOSIS — J449 Chronic obstructive pulmonary disease, unspecified: Secondary | ICD-10-CM

## 2017-05-16 DIAGNOSIS — M549 Dorsalgia, unspecified: Secondary | ICD-10-CM

## 2017-05-16 LAB — VASOACTIVE INTESTINAL PEPTIDE (VIP): Vasoactive Intest Polypeptide: 54.3 pg/mL (ref 0.0–58.8)

## 2017-05-16 NOTE — Telephone Encounter (Signed)
Offer outpatient CXR   Dx COPD, left upper back pain

## 2017-05-16 NOTE — Telephone Encounter (Signed)
Pt is aware of below message and voiced her understanding.  Order for CXR has been ordered.  Nothing further is needed.

## 2017-05-16 NOTE — Telephone Encounter (Signed)
Patient would like to know if results about diarrhea- sent note about a medication that states may cause severe diarrhea so she doesn't know if she should take it please advise on that

## 2017-05-16 NOTE — Telephone Encounter (Signed)
The blood test was negative.  Please find out what medications he is talking about.  However would like to recheck her stool test for clostridium difficile infection.  She needs to talk to Dr. Earlean Shawl for her continued problems

## 2017-05-16 NOTE — Telephone Encounter (Signed)
Spoke with patient and she is about to go to the pharmacy to get medication

## 2017-05-16 NOTE — Telephone Encounter (Signed)
Spoke with patient. She stated that the back and shoulder pain that she discussed late last year with CY is back. She described the pain as a burning, sharp pain. It is in her upper back and spreads to her left shoulder blade. It has been going on for weeks.   OTC Tylenol is not helping. She wants to know if she could come in for an xray. Denied any increased SOB or chest pain. No numbness in arms or legs.  CY, please advise. Thanks!

## 2017-05-17 ENCOUNTER — Ambulatory Visit (INDEPENDENT_AMBULATORY_CARE_PROVIDER_SITE_OTHER)
Admission: RE | Admit: 2017-05-17 | Discharge: 2017-05-17 | Disposition: A | Discharge status: Home / Self Care | Payer: Medicare Other | Source: Ambulatory Visit | Attending: Internal Medicine | Admitting: Internal Medicine

## 2017-05-17 DIAGNOSIS — J449 Chronic obstructive pulmonary disease, unspecified: Secondary | ICD-10-CM | POA: Diagnosis not present

## 2017-05-17 DIAGNOSIS — M549 Dorsalgia, unspecified: Secondary | ICD-10-CM

## 2017-05-21 ENCOUNTER — Other Ambulatory Visit: Payer: Self-pay | Admitting: Internal Medicine

## 2017-05-22 NOTE — Telephone Encounter (Signed)
No results have been put in for the blood work done at 12/31 please advise as these are the results the patient is looking for

## 2017-05-22 NOTE — Telephone Encounter (Signed)
Patient is refusing to go back to Dr. Earlean Shawl she states he did not help her and she can not afford the medication he tried to give her and as states in the note on 12/19 he prescribed xifaxan 550 mg tablets and patient read a warning on the bottle that it may cause severe diarrhea so she will not take it- patient is still having diarrhea and feeling miserable please advise

## 2017-05-22 NOTE — Telephone Encounter (Signed)
Patient is calling to see what her lab results where, please today she asked.

## 2017-05-22 NOTE — Telephone Encounter (Signed)
This has been mentioned on my previous note and it was normal

## 2017-05-22 NOTE — Telephone Encounter (Signed)
My only recommendation is she try the clonidine patch that was discussed before and she will stop her metoprolol when she does this.  Otherwise he can have her see a gastroenterologist at [facility]

## 2017-05-23 NOTE — Telephone Encounter (Signed)
[facility]  Is calling about paper that was faxed over to [place] to fill out and fax back. They are checking back in to see if we have received the paper   Please advise [phone] Fax# [phone]

## 2017-05-24 ENCOUNTER — Telehealth: Payer: Self-pay

## 2017-05-24 NOTE — Telephone Encounter (Signed)
Called patient but got busy signal

## 2017-05-24 NOTE — Telephone Encounter (Signed)
Patient ask you to call her with results of blood test.

## 2017-05-24 NOTE — Telephone Encounter (Signed)
Called but phone was busy will try again later

## 2017-05-25 NOTE — Telephone Encounter (Signed)
Patient has been given results but I called and explained them to her again- patient stated she will try the clonodine patch starting tomorrow and she will stop metoprolol- patient would like to know how and if Dr. Dwyane Dee is still going to do Cdif test because she had stool samples done and cultured at [facility] office so she wants to know if you have the results back from that so she will not have to do all those tests again please advise

## 2017-05-25 NOTE — Telephone Encounter (Signed)
She will need to measure her blood pressure regularly with using clonidine.  Also is she interested in the gastroenterologist at [facility]?  Her C. difficile was negative in December and we can skip this

## 2017-05-25 NOTE — Telephone Encounter (Addendum)
[name] Self [phone]  [name] called very frustrated, she is still having problems and would like to get her test results. Please give her a call back! She will be going to store around 12, will be gone about an hour.

## 2017-05-29 ENCOUNTER — Other Ambulatory Visit: Payer: Self-pay | Admitting: Endocrinology

## 2017-05-29 ENCOUNTER — Telehealth: Payer: Self-pay | Admitting: Endocrinology

## 2017-05-29 MED ORDER — CIPROFLOXACIN HCL 500 MG PO TABS: 500.0000 mg | ORAL_TABLET | Freq: Two times a day (BID) | ORAL | 0 refills | Status: DC

## 2017-05-29 NOTE — Telephone Encounter (Signed)
Has not started clonidine patch because she was sick to her stomach- she wants to know why Dr. Dwyane Dee is taking her off of metoprolol - she stated Dr. Jacinto Reap does not want her to use the patch- she also is requesting Dr. Dwyane Dee recommend a urologist for her to go to- please advise

## 2017-05-29 NOTE — Telephone Encounter (Signed)
Patient called-she definitely has Cystitis (bladder infection). Please sent script for Cipro-enough to kill infection (7 to 14 days supply) to [facility] on [address]-[place]

## 2017-05-29 NOTE — Telephone Encounter (Signed)
7 day prescription has been sent but in future she will have to talk to her urologist about treatment and prevention since she is having too many antibiotics She needs to talk to Dr. Alessandra Bevels at [facility] for her diarrhea She was supposed to start the clonidine patch on the weekend, not clear if she is doing this?

## 2017-05-29 NOTE — Telephone Encounter (Signed)
She was told to take the patch and stop metoprolol but if she does not want to the patch she will stay on metoprolol.  If she is not going to follow my instructions cannot advise her on the diarrhea problem.  She needs to see Dr. Dorina Hoyer or Dr Risa Grill at [facility].  She will need to call herself and request first available Dr.

## 2017-05-29 NOTE — Telephone Encounter (Signed)
Spoke to the patient and [name] stated an understanding of the note below- patient stated [name] is still experiencing diarrhea and feels this is ruining her life [name] wants to know if [name] can go back to Dr. B [name] did not know the name but stated Dr. Dwyane Dee knows who that is- [name] thinks [name] has cystitis again burns to urinate and her urine is cloudy please advise

## 2017-06-06 NOTE — Telephone Encounter (Signed)
[name] with [employer] is following up about a paper that was faxed over for [place] to fill out a fax back so the patient can qualify for the freestyle libra.  Was faxed to [place] on the 1/09 and faxed today.      [name] [phone]

## 2017-06-08 DIAGNOSIS — M542 Cervicalgia: Secondary | ICD-10-CM | POA: Diagnosis not present

## 2017-06-08 DIAGNOSIS — M47816 Spondylosis without myelopathy or radiculopathy, lumbar region: Secondary | ICD-10-CM | POA: Diagnosis not present

## 2017-06-08 DIAGNOSIS — M545 Low back pain: Secondary | ICD-10-CM | POA: Diagnosis not present

## 2017-06-11 NOTE — Telephone Encounter (Signed)
This has been completed and forwarded to [facility]

## 2017-06-12 ENCOUNTER — Other Ambulatory Visit: Payer: Self-pay | Admitting: Rehabilitation

## 2017-06-12 ENCOUNTER — Other Ambulatory Visit: Payer: Self-pay | Admitting: Orthopaedic Surgery

## 2017-06-12 DIAGNOSIS — M47816 Spondylosis without myelopathy or radiculopathy, lumbar region: Secondary | ICD-10-CM

## 2017-06-12 NOTE — Telephone Encounter (Signed)
Called patient and left a voice message to let her know that [name] should be able to finalize her [facility] and hopefully will be able to get that to her soon.

## 2017-06-12 NOTE — Telephone Encounter (Signed)
I have printed out another chart note from 05/07/2017 and I have had Dr. Dwyane Dee sign the Addendum where it stated that [name] is injecting 3 times daily and I have sent this to [facility] and I have received the fax confirmation.

## 2017-06-12 NOTE — Telephone Encounter (Signed)
Called [name] from [employer] and she stated that she had everything that she needed except she needs Dr. Dwyane Dee to sign that Ms. Mcconathy is injecting insulin 3 times daily then I can fax this in to them at [phone] and put attention [facility].

## 2017-06-12 NOTE — Telephone Encounter (Signed)
That information is on my last addendum that you have to to print and have me sign

## 2017-06-13 DIAGNOSIS — R300 Dysuria: Secondary | ICD-10-CM | POA: Diagnosis not present

## 2017-06-13 DIAGNOSIS — R311 Benign essential microscopic hematuria: Secondary | ICD-10-CM | POA: Diagnosis not present

## 2017-06-15 ENCOUNTER — Encounter: Payer: Self-pay | Admitting: Endocrinology

## 2017-06-15 ENCOUNTER — Ambulatory Visit (INDEPENDENT_AMBULATORY_CARE_PROVIDER_SITE_OTHER): Payer: Medicare Other | Admitting: Endocrinology

## 2017-06-15 ENCOUNTER — Telehealth: Payer: Self-pay | Admitting: Endocrinology

## 2017-06-15 VITALS — BP 152/72 | HR 89 | Temp 98.1°F | Ht 63.0 in | Wt 193.4 lb

## 2017-06-15 DIAGNOSIS — R197 Diarrhea, unspecified: Secondary | ICD-10-CM

## 2017-06-15 DIAGNOSIS — R600 Localized edema: Secondary | ICD-10-CM | POA: Diagnosis not present

## 2017-06-15 DIAGNOSIS — E1143 Type 2 diabetes mellitus with diabetic autonomic (poly)neuropathy: Secondary | ICD-10-CM

## 2017-06-15 DIAGNOSIS — I10 Essential (primary) hypertension: Secondary | ICD-10-CM

## 2017-06-15 DIAGNOSIS — K3184 Gastroparesis: Secondary | ICD-10-CM | POA: Diagnosis not present

## 2017-06-15 NOTE — Telephone Encounter (Signed)
She needs to be seen: either she can come in the office before 4 PM or go to the urgent care center

## 2017-06-15 NOTE — Progress Notes (Signed)
Chief complaint: Right leg swelling and other problems   History of Present Illness:  PROBLEM 1  She has started having swelling of her right ankle, foot and lower leg one day ago She thinks it was significant this morning and was asked to come in for further evaluation; she thinks she had difficulty getting her she wanted this morning She thinks her right ankle and foot are also starting to swell this morning She has not had any injury, pain, skin changes, no ulcers Has had no right calf pain or leg pain on walking.  She has no previous history of swelling.  PROBLEM 2  DIARRHEA: She continues to have episodes of severe diarrhea and has discussed the problem several times with 2 gastroenterologists and no evident cause has been found She was told to take probiotic by the last gastroenterologist and told to stop Colestid which the other gastroenterologist had advised her Also not clear if Colestid had helped  However she said that now the diarrhea is more intermittent and it will occur every few days and then be severe for couple of days at least  She is not having an abdominal pain, however has lost weight from somewhat decreased appetite and persistent nausea which is another problem discussed today.  She is taking promethazine but has not taken REGLAN as prescribed before or the nausea   Wt Readings from Last 3 Encounters:  06/15/17 193 lb 6.4 oz (87.7 kg)  05/07/17 194 lb 12.8 oz (88.4 kg)  02/09/17 198 lb (89.8 kg)     HYPERTENSION:  Her blood pressure appears to be higher than usual and she previously was on valsartan which was stopped after her hospitalization She has not checked her blood pressure much at home and blood pressure appears to be higher today than usual but she is also somewhat concerned about her new problems She was also recommended starting Catapres patch because of her diarrhea as above but she has hesitated to do this again  No  headaches  BP Readings from Last 3 Encounters:  06/15/17 (!) 152/72  05/07/17 138/80  02/09/17 128/80    Allergies as of 06/15/2017      Reactions   Chlordiazepoxide-clidinium Other (See Comments)   Sleepy,weak   Biaxin [clarithromycin] Other (See Comments)   Foul taste, abd pain, diarrhea   Tramadol Other (See Comments)   Caused tremors   Codeine Other (See Comments)   REACTION: gi upset- only in high doses per pt.  Can tolerate cough syrup with codeine.       Medication List        Accurate as of 06/15/17  3:50 PM. Always use your most recent med list.          acetaminophen 500 MG tablet Commonly known as:  TYLENOL Take 1,000 mg by mouth every 6 (six) hours as needed for headache (pain).   ALPRAZolam 0.5 MG tablet Commonly known as:  XANAX take 1/2 to 1 tablet by mouth twice a day   amoxicillin 500 MG tablet Commonly known as:  AMOXIL Take 1 tablet (500 mg total) by mouth 2 (two) times daily.   amoxicillin-clavulanate 875-125 MG tablet Commonly known as:  AUGMENTIN   aspirin 325 MG tablet Take 325 mg by mouth daily.   B-D UF III MINI PEN NEEDLES 31G X 5 MM Misc Generic drug:  Insulin Pen Needle USE 5 PER DAY TO INJECT INSULIN   B-D ULTRAFINE III SHORT PEN 31G X 8 MM Misc  Generic drug:  Insulin Pen Needle TEST 3 TIMES A DAY   cholestyramine 4 g packet Commonly known as:  QUESTRAN take 1 packet once daily if needed for diarrhea   ciprofloxacin 500 MG tablet Commonly known as:  CIPRO Take 1 tablet (500 mg total) by mouth 2 (two) times daily.   dicyclomine 10 MG capsule Commonly known as:  BENTYL take 1 capsule by mouth once daily   glucose blood test strip Commonly known as:  ACCU-CHEK AVIVA PLUS Use as instructed to check blood sugars 8 times per day dx code 250.02   hydrocortisone 2.5 % rectal cream Commonly known as:  ANUSOL-HC Place 1 application rectally 3 (three) times daily.   insulin aspart 100 UNIT/ML FlexPen Commonly known as:  NOVOLOG  FLEXPEN Inject 30 units at breakfast- 24 units at lunch- and 30 units at supper   NOVOLOG FLEXPEN 100 UNIT/ML FlexPen Generic drug:  insulin aspart inject 18 units subcutaneously WITH BREAKFAST 22 units WITH LUNCH AND 24 UNITS AT SUPPER   Insulin Syringe-Needle U-100 31G X 15/64" 0.5 ML Misc Commonly known as:  BD INSULIN SYRINGE ULTRAFINE Use 2 per day dx code E11.65   magic mouthwash Soln Swish with 2 teaspoonfuls by mouth as needed   meclizine 25 MG tablet Commonly known as:  ANTIVERT take 1 tablet by mouth three times a day if needed for dizziness   metoCLOPramide 5 MG tablet Commonly known as:  REGLAN Take 1 tablet (5 mg total) by mouth every 8 (eight) hours as needed for nausea.   metoprolol succinate 25 MG 24 hr tablet Commonly known as:  TOPROL-XL take 1 tablet by mouth once daily   omeprazole 20 MG capsule Commonly known as:  PRILOSEC take 1 capsule by mouth once daily   OXYGEN Inhale 3 L into the lungs continuous.   pravastatin 20 MG tablet Commonly known as:  PRAVACHOL Take 1 tablet (20 mg total) by mouth daily.   predniSONE 10 MG tablet Commonly known as:  DELTASONE 70mX2 days, 334mX2 days, 208m2 days, 83m51mdays, then stop.   promethazine 25 MG tablet Commonly known as:  PHENERGAN Take 1 tablet (25 mg total) by mouth every 6 (six) hours as needed for nausea or vomiting.   STIOLTO RESPIMAT 2.5-2.5 MCG/ACT Aers Generic drug:  Tiotropium Bromide-Olodaterol inhale 2 puffs once daily   TRESIBA FLEXTOUCH 200 UNIT/ML Sopn Generic drug:  Insulin Degludec inject 94 units subcutaneously once daily   trimethoprim 100 MG tablet Commonly known as:  TRIMPEX Take 100 mg by mouth at bedtime. Reported on 09/06/2015   venlafaxine XR 37.5 MG 24 hr capsule Commonly known as:  EFFEXOR XR Take 1 capsule (37.5 mg total) by mouth daily with breakfast.   XOPENEX HFA 45 MCG/ACT inhaler Generic drug:  levalbuterol inhale 2 puffs by mouth every 6 hours if needed for  wheezing or shortness of breath       Allergies:  Allergies  Allergen Reactions  . Chlordiazepoxide-Clidinium Other (See Comments)    Sleepy,weak  . Biaxin [Clarithromycin] Other (See Comments)    Foul taste, abd pain, diarrhea  . Tramadol Other (See Comments)    Caused tremors  . Codeine Other (See Comments)    REACTION: gi upset- only in high doses per pt.  Can tolerate cough syrup with codeine.     Past Medical History:  Diagnosis Date  . Allergic rhinitis, cause unspecified    Sinus CT Rec 12-23-2009  . Arthritis   . Benign paroxysmal positional  vertigo 08/09/2012  . 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 Complication of anesthesia    takes along time to wake up  . Diabetes mellitus   . Diarrhea   . Diverticulosis   . Esophageal reflux   . Esophageal stricture   . GERD (gastroesophageal reflux disease)   . Glaucoma   . Hiatal hernia   . Hypertension   . Irritable bowel syndrome   . Neuropathy in diabetes ([place]) 08/09/2012  . Other chronic nonalcoholic liver disease   . Oxygen deficiency   . Sciatica   . Shingles 2010  . Shortness of breath   . Steatohepatitis     Past Surgical History:  Procedure Laterality Date  . APPENDECTOMY    . CARPAL TUNNEL RELEASE     right hand  . CATARACT EXTRACTION     Bilateral  . CHOLECYSTECTOMY OPEN  1978  . COLONOSCOPY  07-2001   mild diverticulosis  . ESOPHAGOGASTRODUODENOSCOPY  8182,[id number]   H Hernia,es.stricture s/p dil 31F  . EYE SURGERY    . LIVER BIOPSY  08-1992  . PARATHYROID EXPLORATION    . TONSILLECTOMY AND ADENOIDECTOMY    . TOTAL ABDOMINAL HYSTERECTOMY    . ULNAR NERVE TRANSPOSITION  12/07/2011   Procedure: ULNAR NERVE DECOMPRESSION/TRANSPOSITION;this was cancelled-not done  Surgeon: Cammie Sickle., MD;  Location: [facility];  Service: Orthopedics;  Laterality: Right;  right ulnar nerve in situ decompression  . ULNAR TUNNEL RELEASE   03/07/2012   Procedure: CUBITAL TUNNEL RELEASE;  Surgeon: Roseanne Kaufman, MD;  Location: [facility];  Service: Orthopedics;  Laterality: Right;  ulnar nerve release at the elbow      Family History  Problem Relation Age of Onset  . Heart disease Father   . Lung cancer Mother        small cell;Byssinosis  . Lung cancer Sister   . Liver cancer Sister        ? mets from another area of the body  . Diabetes Unknown        grandmother  . Stroke Maternal Grandfather     Social History:  reports that she quit smoking about 29 years ago. Her smoking use included cigarettes. she has never used smokeless tobacco. She reports that she does not drink alcohol or use drugs.   No visits with results within 1 Week(s) from this visit.  Latest known visit with results is:  Lab on 05/11/2017  Component Date Value Ref Range Status  . Vasoactive Intest Polypeptide 05/11/2017 54.3  0.0 - 58.8 pg/mL Final   Comment: Results for this test are for research purposes only by the assay's manufacturer.  The performance characteristics of this product have not been established.  Results should not be used as a diagnostic procedure without confirmation of the diagnosis by another medically established diagnostic product or procedure.   . Color, Urine 05/11/2017 YELLOW  Yellow;Lt. Yellow Final  . APPearance 05/11/2017 CLEAR  Clear Final  . Specific Gravity, Urine 05/11/2017 1.020  1.000 - 1.030 Final  . pH 05/11/2017 6.0  5.0 - 8.0 Final  . Total Protein, Urine 05/11/2017 NEGATIVE  Negative Final  . Urine Glucose 05/11/2017 100* Negative Final  . Ketones, ur 05/11/2017 NEGATIVE  Negative Final  . Bilirubin Urine 05/11/2017 NEGATIVE  Negative Final  . Hgb urine dipstick 05/11/2017 NEGATIVE  Negative Final  . Urobilinogen, UA 05/11/2017 0.2  0.0 - 1.0 Final  .  Leukocytes, UA 05/11/2017 TRACE* Negative Final  . Nitrite 05/11/2017 NEGATIVE  Negative Final  . WBC, UA 05/11/2017 7-10/hpf*  0-2/hpf Final  . RBC / HPF 05/11/2017 none seen  0-2/hpf Final  . Squamous Epithelial / LPF 05/11/2017 Rare(0-4/hpf)  Rare(0-4/hpf) Final  . Microalb, Ur 05/11/2017 1.1  0.0 - 1.9 mg/dL Final  . Creatinine,U 05/11/2017 114.4  mg/dL Final  . Microalb Creat Ratio 05/11/2017 1.0  0.0 - 30.0 mg/g Final    EXAM:  BP (!) 152/72   Pulse 89   Temp 98.1 F (36.7 C)   Ht 5' 3"  (1.6 m)   Wt 193 lb 6.4 oz (87.7 kg)   SpO2 95%   BMI 34.26 kg/m   Physical Exam  She looks well She had mild swelling of the right foot but no clear pitting edema the leg and only minimal edema behind the ankle The left foot does not look swollen There is no swelling apparent on the right calf area Homans sign is negative and no tenderness of the right calf  Foot exam of the right side shows no skin ulceration, warmth, areas of erythema, no cyanosis, no tenderness of the bones and ankle movement is normal without discomfort She has decreased distal sensation as before  Assessment/Plan:   1. swelling of the right foot: This appears to be minimal now and she does not appear to have any signs of local inflammation, infection or any suggestive signs of right venous thrombosis.  Most likely she has some venous insufficiency since her swelling is better now and she does not have any generalized edema to indicate systemic cause.  For now she'll start elevating the right foot and call us if she has any persistent edema 2. NAUSEA: She needs to start back on metoclopramide 3. DIARRHEA: This may well be related to autonomic neuropathy but IBS may be a factor since she has long history also.  Since no other medications work she can start back on Colestid which she has at home.  Discussed that she can take only the first tablet when the diarrhea starts and take it only as needed with one tablet at bedtime.  VIP level was also normal and she has not had any evidence of C. difficile 4. HYPERTENSION: She will start monitoring her  blood pressure at home and if it is consistently high in follow-up will restart her back on valsartan  Total visit time for evaluation and management of multiple problems = 25 minutes  Jahnavi Muratore 06/15/2017, 3:50 PM

## 2017-06-15 NOTE — Telephone Encounter (Signed)
Being seen today

## 2017-06-15 NOTE — Telephone Encounter (Signed)
Called patient and she stated that she went to the urologist on Wednesday. She stated that this started last night and she stated that it is really swollen and she is not able to wear her shoes. Now her left foot is starting to swell.  Please see previous note.  Please advise.

## 2017-06-15 NOTE — Patient Instructions (Addendum)
Check BP at least every other day and call if consistently over 145  Elevate feet  Take Metclopramide 30 minutes before meals for nausea  Take Colestid tablet on the day the diarrhea starts and take as needed subsequently until the diarrhea stops

## 2017-06-15 NOTE — Telephone Encounter (Signed)
Patient right ankle and foot are swollen very badly-left foot is starting to swell-she called urologist and they said they did not think it is caused by cystitis medication and that she needed to call Dr Ronnie Derby office. Patient was also told that she may need to go to Urgent Care if not better after keeping elevated. Please call patient at ph# [phone] to advise asap

## 2017-06-18 ENCOUNTER — Other Ambulatory Visit: Payer: Self-pay | Admitting: Internal Medicine

## 2017-06-19 NOTE — Telephone Encounter (Signed)
Ok to refill 

## 2017-06-19 NOTE — Telephone Encounter (Signed)
CY please advise on refill as RX is not on med list. Thanks.

## 2017-06-21 ENCOUNTER — Ambulatory Visit
Admission: RE | Admit: 2017-06-21 | Discharge: 2017-06-21 | Disposition: A | Discharge status: Home / Self Care | Payer: Medicare Other | Source: Ambulatory Visit | Attending: Orthopaedic Surgery | Admitting: Orthopaedic Surgery

## 2017-06-21 DIAGNOSIS — M47816 Spondylosis without myelopathy or radiculopathy, lumbar region: Secondary | ICD-10-CM

## 2017-06-21 DIAGNOSIS — M48061 Spinal stenosis, lumbar region without neurogenic claudication: Secondary | ICD-10-CM | POA: Diagnosis not present

## 2017-06-21 DIAGNOSIS — M4802 Spinal stenosis, cervical region: Secondary | ICD-10-CM | POA: Diagnosis not present

## 2017-06-28 ENCOUNTER — Other Ambulatory Visit: Payer: Self-pay

## 2017-06-28 DIAGNOSIS — M47812 Spondylosis without myelopathy or radiculopathy, cervical region: Secondary | ICD-10-CM | POA: Diagnosis not present

## 2017-06-28 DIAGNOSIS — M48061 Spinal stenosis, lumbar region without neurogenic claudication: Secondary | ICD-10-CM | POA: Diagnosis not present

## 2017-06-28 DIAGNOSIS — M75102 Unspecified rotator cuff tear or rupture of left shoulder, not specified as traumatic: Secondary | ICD-10-CM | POA: Diagnosis not present

## 2017-06-28 DIAGNOSIS — M545 Low back pain: Secondary | ICD-10-CM | POA: Diagnosis not present

## 2017-06-28 MED ORDER — ACCU-CHEK MULTICLIX LANCETS MISC: 2 refills | Status: DC

## 2017-07-03 ENCOUNTER — Telehealth: Payer: Self-pay | Admitting: Internal Medicine

## 2017-07-03 NOTE — Telephone Encounter (Signed)
Pt states cough has been coming and going; uses Tessalon but not helping much at all. Pt states phlegm is getting caught in her throat. Pt denies any fever or chills as well.   CY Please advise. Thanks.

## 2017-07-03 NOTE — Telephone Encounter (Signed)
Thick sticky phlegm can usually be helped by drinking more water. Winter indoor heat makes it worse.  Try "Throat Coat Tea"   Sold on the tea and coffee isle at grocery stores.  Try Fisherman's Friend Throat lozenges

## 2017-07-03 NOTE — Telephone Encounter (Signed)
Called and spoke with pt stating the recommendations per CY.  Pt expressed understanding. Nothing further needed at this current time.

## 2017-07-04 DIAGNOSIS — N3020 Other chronic cystitis without hematuria: Secondary | ICD-10-CM | POA: Diagnosis not present

## 2017-07-04 DIAGNOSIS — R3121 Asymptomatic microscopic hematuria: Secondary | ICD-10-CM | POA: Diagnosis not present

## 2017-07-06 ENCOUNTER — Encounter: Payer: Self-pay | Admitting: Endocrinology

## 2017-07-06 ENCOUNTER — Ambulatory Visit (INDEPENDENT_AMBULATORY_CARE_PROVIDER_SITE_OTHER): Payer: Medicare Other | Admitting: Endocrinology

## 2017-07-06 VITALS — BP 130/72 | HR 87 | Ht 63.0 in | Wt 193.8 lb

## 2017-07-06 DIAGNOSIS — K580 Irritable bowel syndrome with diarrhea: Secondary | ICD-10-CM

## 2017-07-06 DIAGNOSIS — E1165 Type 2 diabetes mellitus with hyperglycemia: Secondary | ICD-10-CM

## 2017-07-06 DIAGNOSIS — Z794 Long term (current) use of insulin: Secondary | ICD-10-CM | POA: Diagnosis not present

## 2017-07-06 NOTE — Progress Notes (Signed)
Patient ID: [name], female   DOB: [birth year], 82 y.o.   MRN: [id number]   Reason for Appointment: Diabetes and follow-up of multiple problems  History of Present Illness    PROBLEM 1:  DIABETES type II since 1980   She has been on insulin using Lantus and Humalog for several years and usually A1c around 8% despite fairly good compliance with diet, exercise and glucose monitoring. She also had been on Actos previously which probably benefited her especially with her fatty liver but she stopped it because of fear of side effects   She was changed from NovoLog to U-500 insulin starting in 02/2016; she did not related was controlling her sugars and preferred to go back to NovoLog.    Recent history:   Insulin regimen: Tresiba  56 units acb   NOVOLOG before meals 20-24-30 units before meals  Her A1c has been previously consistently around 9- 10% and in December 9, previously 8.1   Her blood sugars are about the same as usual with average blood sugar at any time of the day usually between 160-180  Again has fluctuation with standard deviation about 55  She has had intercurrent medical problems, such as UTI which have at times influenced her blood sugar levels  HIGHEST blood sugars are after dinner for the last 30 days  However more recently blood sugars are significantly high before breakfast and suppertime,: Has not checked them as regularly as usual  He also has been waiting to get the Free[employer] libre sensor and still awaiting this to be cleared  She says that blood sugars stay high at times even when she is not eating as much, usually appetite has been decreased  Weight has leveled off  She is sometimes adjusting her insulin based on what she is eating but generally taking the most at suppertime for her NovoLog dose  She will also adjust the dose based on pre-meal blood sugar   Oral hypoglycemic drugs: none       Side effects from medications: None           Proper timing of medications in relation to meals: Yes.          Monitors blood glucose:  5 times a day   Glucometer:  Accu-Chek         Blood Glucose averages recently from meter download:  Mean values apply above for all meters except median for One Touch  PRE-MEAL Fasting Lunch Dinner  overnight Overall  Glucose range:  114-231    57-249   Mean/median:  174  158  182  171  173   POST-MEAL PC Breakfast PC Lunch PC Dinner  Glucose range:     Mean/median:    185   Previous readings:  Mean values apply above for all meters except median for One Touch  PRE-MEAL Fasting Lunch Dinner Bedtime Overall  Glucose range: 86-248 58-279  1 21-296   57-165    Mean/median: 138  150  199  190  170     Meals: 3 meals per day.  Smaller portions; lunch 1-2 pm Supper 6 to 7 PM     Physical activity: exercise: Unable to do any because of dyspnea and fatigue            Dietician visit: Most recent: Several years ago           Complications: are: Peripheral neuropathy with sensory loss     Wt Readings from Last 3 Encounters:  07/06/17 193 lb 12.8 oz (87.9 kg)  06/15/17 193 lb 6.4 oz (87.7 kg)  05/07/17 194 lb 12.8 oz (88.4 kg)    Lab Results  Component Value Date   HGBA1C 9.0 05/07/2017   HGBA1C 8.1 12/20/2016   HGBA1C 9.4 09/18/2016   Lab Results  Component Value Date   MICROALBUR 1.1 05/11/2017   LDLCALC 89 05/07/2017   CREATININE 0.66 05/07/2017     OTHER Current problems  are detailed in review of systems   Allergies as of 07/06/2017      Reactions   Chlordiazepoxide-clidinium Other (See Comments)   Sleepy,weak   Biaxin [clarithromycin] Other (See Comments)   Foul taste, abd pain, diarrhea   Tramadol Other (See Comments)   Caused tremors   Codeine Other (See Comments)   REACTION: gi upset- only in high doses per pt.  Can tolerate cough syrup with codeine.       Medication List        Accurate as of 07/06/17 11:40 AM. Always use your most recent med list.           accu-chek multiclix lancets Use to check blood sugar 8 times per day. Dx code E11.9   acetaminophen 500 MG tablet Commonly known as:  TYLENOL Take 1,000 mg by mouth every 6 (six) hours as needed for headache (pain).   ALPRAZolam 0.5 MG tablet Commonly known as:  XANAX take 1/2 to 1 tablet by mouth twice a day   aspirin 325 MG tablet Take 325 mg by mouth daily.   B-D UF III MINI PEN NEEDLES 31G X 5 MM Misc Generic drug:  Insulin Pen Needle USE 5 PER DAY TO INJECT INSULIN   B-D ULTRAFINE III SHORT PEN 31G X 8 MM Misc Generic drug:  Insulin Pen Needle TEST 3 TIMES A DAY   benzonatate 200 MG capsule Commonly known as:  TESSALON take 1 capsule by mouth three times a day if needed for cough   cholestyramine 4 g packet Commonly known as:  QUESTRAN take 1 packet once daily if needed for diarrhea   dicyclomine 10 MG capsule Commonly known as:  BENTYL take 1 capsule by mouth once daily   glucose blood test strip Commonly known as:  ACCU-CHEK AVIVA PLUS Use as instructed to check blood sugars 8 times per day dx code 250.02   hydrocortisone 2.5 % rectal cream Commonly known as:  ANUSOL-HC Place 1 application rectally 3 (three) times daily.   insulin aspart 100 UNIT/ML FlexPen Commonly known as:  NOVOLOG FLEXPEN Inject 30 units at breakfast- 24 units at lunch- and 30 units at supper   NOVOLOG FLEXPEN 100 UNIT/ML FlexPen Generic drug:  insulin aspart inject 18 units subcutaneously WITH BREAKFAST 22 units WITH LUNCH AND 24 UNITS AT SUPPER   Insulin Syringe-Needle U-100 31G X 15/64" 0.5 ML Misc Commonly known as:  BD INSULIN SYRINGE ULTRAFINE Use 2 per day dx code E11.65   magic mouthwash Soln Swish with 2 teaspoonfuls by mouth as needed   meclizine 25 MG tablet Commonly known as:  ANTIVERT take 1 tablet by mouth three times a day if needed for dizziness   metoCLOPramide 5 MG tablet Commonly known as:  REGLAN Take 1 tablet (5 mg total) by mouth every 8 (eight) hours  as needed for nausea.   metoprolol succinate 25 MG 24 hr tablet Commonly known as:  TOPROL-XL take 1 tablet by mouth once daily   omeprazole 20 MG capsule Commonly known as:  PRILOSEC take  1 capsule by mouth once daily   OXYGEN Inhale 3 L into the lungs continuous.   pravastatin 20 MG tablet Commonly known as:  PRAVACHOL Take 1 tablet (20 mg total) by mouth daily.   predniSONE 10 MG tablet Commonly known as:  DELTASONE 68mX2 days, 313mX2 days, 2030m2 days, 83m35mdays, then stop.   promethazine 25 MG tablet Commonly known as:  PHENERGAN Take 1 tablet (25 mg total) by mouth every 6 (six) hours as needed for nausea or vomiting.   STIOLTO RESPIMAT 2.5-2.5 MCG/ACT Aers Generic drug:  Tiotropium Bromide-Olodaterol inhale 2 puffs once daily   TRESIBA FLEXTOUCH 200 UNIT/ML Sopn Generic drug:  Insulin Degludec inject 94 units subcutaneously once daily   venlafaxine XR 37.5 MG 24 hr capsule Commonly known as:  EFFEXOR XR Take 1 capsule (37.5 mg total) by mouth daily with breakfast.   XOPENEX HFA 45 MCG/ACT inhaler Generic drug:  levalbuterol inhale 2 puffs by mouth every 6 hours if needed for wheezing or shortness of breath       Allergies:  Allergies  Allergen Reactions  . Chlordiazepoxide-Clidinium Other (See Comments)    Sleepy,weak  . Biaxin [Clarithromycin] Other (See Comments)    Foul taste, abd pain, diarrhea  . Tramadol Other (See Comments)    Caused tremors  . Codeine Other (See Comments)    REACTION: gi upset- only in high doses per pt.  Can tolerate cough syrup with codeine.     Past Medical History:  Diagnosis Date  . Allergic rhinitis, cause unspecified    Sinus CT Rec 12-23-2009  . Arthritis   . Benign paroxysmal positional vertigo 08/09/2012  . 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 Complication of anesthesia    takes along time to wake up  . Diabetes mellitus   . Diarrhea   .  Diverticulosis   . Esophageal reflux   . Esophageal stricture   . GERD (gastroesophageal reflux disease)   . Glaucoma   . Hiatal hernia   . Hypertension   . Irritable bowel syndrome   . Neuropathy in diabetes (HCC)[place]4/2014  . Other chronic nonalcoholic liver disease   . Oxygen deficiency   . Sciatica   . Shingles 2010  . Shortness of breath   . Steatohepatitis     Past Surgical History:  Procedure Laterality Date  . APPENDECTOMY    . CARPAL TUNNEL RELEASE     right hand  . CATARACT EXTRACTION     Bilateral  . CHOLECYSTECTOMY OPEN  1978  . COLONOSCOPY  07-2001   mild diverticulosis  . ESOPHAGOGASTRODUODENOSCOPY  [id number] Hernia,es.stricture s/p dil 55F  . EYE SURGERY    . LIVER BIOPSY  08-1992  . PARATHYROID EXPLORATION    . TONSILLECTOMY AND ADENOIDECTOMY    . TOTAL ABDOMINAL HYSTERECTOMY    . ULNAR NERVE TRANSPOSITION  12/07/2011   Procedure: ULNAR NERVE DECOMPRESSION/TRANSPOSITION;this was cancelled-not done  Surgeon: RobeCammie SickleD;  [facility];  Laterality: Right;  right ulnar nerve in situ decompression  . ULNAR TUNNEL RELEASE  03/07/2012   Procedure: CUBITAL TUNNEL RELEASE;  Surgeon: WillRoseanne Kaufman;  [facility];  Laterality: Right;  ulnar nerve release at the elbow      Family History  Problem Relation Age of Onset  . Heart disease Father   . Lung cancer Mother  small cell;Byssinosis  . Lung cancer Sister   . Liver cancer Sister        ? mets from another area of the body  . Diabetes Unknown        grandmother  . Stroke Maternal Grandfather     Social History:  reports that she quit smoking about 29 years ago. Her smoking use included cigarettes. she has never used smokeless tobacco. She reports that she does not drink alcohol or use drugs.  Review of Systems:  DIARRHEA: She was recommended taking Colestid on her last visit as needed  when she has diarrhea However she thinks that diarrhea episodes have become less frequent and better controllable She has had a couple of stools the last couple of days which are not significantly loose   HYPERTENSION:   blood pressure is well controlled  Blood pressure was relatively higher last month Currently only on metoprolol   BP Readings from Last 3 Encounters:  07/06/17 130/72  06/15/17 (!) 152/72  05/07/17 138/80    She takes Xanax at night mostly and lorazepam was given recently again in the daytime for anxiety  HYPERLIPIDEMIA:  She has had mixed hyperlipidemia, LDL treated with pravastatin 20 mg with adequate LDL control     Lab Results  Component Value Date   CHOL 158 05/07/2017   HDL 34.40 (L) 05/07/2017   LDLCALC 89 05/07/2017   LDLDIRECT 89.0 10/14/2014   TRIG 175.0 (H) 05/07/2017   CHOLHDL 5 05/07/2017    She is followed by pulmonologist for chronic COPD, taking home oxygen  No recent problems with edema, was seen last month for transient edema    Examination:   BP 130/72   Pulse 87   Ht 5' 3"  (1.6 m)   Wt 193 lb 12.8 oz (87.9 kg)   SpO2 (!) 89%   BMI 34.33 kg/m   Body mass index is 34.33 kg/m.      ASSESSMENT/ PLAN:    Diabetes type 2 with obesity, insulin-dependent  See history of present illness for detailed discussion of  current management, blood sugar patterns and problems identified  Her blood sugars are reasonably controlled, has had fluctuation making her overall control difficult long-term Average blood sugar 170, similar to previous However most significantly she is having higher readings fasting and she can go up 4 units on her [place] Aas for now She may also need more insulin at suppertime if bedtime readings are consistently high  A1c is now 9%  DIARRHEA: Improving, likely to be irritable bowel syndrome  HYPERTENSION:  Fairly good control  with metoprolol alone   Patient Instructions  Tresiba 60 units and keep am sugar  100-150       Dabid [place] 07/06/2017, 11:40 AM

## 2017-07-06 NOTE — Patient Instructions (Signed)
Tresiba 60 units and keep am sugar 100-150

## 2017-07-12 DIAGNOSIS — R311 Benign essential microscopic hematuria: Secondary | ICD-10-CM | POA: Diagnosis not present

## 2017-07-12 DIAGNOSIS — R3129 Other microscopic hematuria: Secondary | ICD-10-CM | POA: Diagnosis not present

## 2017-07-18 ENCOUNTER — Other Ambulatory Visit: Payer: Self-pay | Admitting: Endocrinology

## 2017-07-20 ENCOUNTER — Ambulatory Visit: Payer: Medicare Other | Admitting: Podiatry

## 2017-07-23 ENCOUNTER — Encounter: Payer: Medicare Other | Attending: Endocrinology | Admitting: Nutrition

## 2017-07-23 DIAGNOSIS — E119 Type 2 diabetes mellitus without complications: Secondary | ICD-10-CM

## 2017-07-23 DIAGNOSIS — Z794 Long term (current) use of insulin: Principal | ICD-10-CM

## 2017-07-24 ENCOUNTER — Telehealth: Payer: Self-pay | Admitting: Nutrition

## 2017-07-24 NOTE — Telephone Encounter (Signed)
Next time she uses the sensor she can put hydrocortisone cream on the area before applying it.  Diarrhea is not from the sensor

## 2017-07-24 NOTE — Telephone Encounter (Signed)
Pt. Called saying that after she left here, (she was shown how to insert and read her Libre sensor), she had terrible stomach pains and diarrhea, and was up all night.  She thinks it is from the sensor.  She is also complaining of itching, and wants to remove the sensor, and not use it.  Says the site is itching her, and her blood sugars are showing high this afternoon, and when she tested with a finger stick, it was "way off" and this stresses her out.  She does not like this, and continues to think that the diarrhea is a direct result of the sensor insertion

## 2017-07-25 NOTE — Progress Notes (Signed)
Patient and her care giver were trained on how to use the [place].  A sensor was inserted on her upper left arm, without any difficulty, and her reader was set with date time.  She was  Shown how to use the reader, and we discussed the difference between sensor readings and blood glucose readings.  She reported good understanding of this with no final questions.

## 2017-07-25 NOTE — Telephone Encounter (Signed)
She needs to make a decision herself and call whichever Dr. she prefers, does not need referral again

## 2017-07-25 NOTE — Telephone Encounter (Signed)
Pt informed of below. She is going to try Imodium first.  She wants you to call her gastroenterologist at [facility] so they will take her back OR do you want her to see [facility]?

## 2017-07-25 NOTE — Telephone Encounter (Signed)
If she has tried Imodium then we can call in Lomotil, to take twice a day as needed.  She needs to go back to her gastroenterologist

## 2017-07-25 NOTE — Telephone Encounter (Signed)
Pt informed of below. She refuses to use the [place] meter. She states she will continue to use her old meter.   What can she do for diarrhea? She states she has been taking the cholestyramine, but  the diarrhea is pure water now and she was up all night.

## 2017-07-26 NOTE — Telephone Encounter (Signed)
Pt informed

## 2017-07-27 DIAGNOSIS — N3020 Other chronic cystitis without hematuria: Secondary | ICD-10-CM | POA: Diagnosis not present

## 2017-07-27 DIAGNOSIS — R311 Benign essential microscopic hematuria: Secondary | ICD-10-CM | POA: Diagnosis not present

## 2017-07-27 NOTE — Telephone Encounter (Signed)
FYI  Pt called back to let you know she is scheduled at [facility] 08/13/17 with Alonza Bogus, PA.

## 2017-08-03 ENCOUNTER — Telehealth: Payer: Self-pay | Admitting: Internal Medicine

## 2017-08-03 MED ORDER — AMOXICILLIN 500 MG PO CAPS: 500.0000 mg | ORAL_CAPSULE | Freq: Two times a day (BID) | ORAL | 0 refills | Status: DC

## 2017-08-03 NOTE — Telephone Encounter (Signed)
Offer amoxacillin 500 mg, # 14, 1 twice daily

## 2017-08-03 NOTE — Telephone Encounter (Signed)
Called and spoke with pt letting her know we were sending script of amoxicillin to her pharmacy.  Stated to pt to call our office if cough was no better after Rx.  Pt expressed understanding. Verified pt's pharmacy and sent Rx in.  Nothing further needed.

## 2017-08-03 NOTE — Telephone Encounter (Signed)
Called and spoke with pt who stated she began coughing up thick green blobs of phlegm that started last night, 08/02/17 and still today she has been doing the same.  Pt stated the mucus is so thick she has been choking while trying to get it up.  Dr. Annamaria Boots, please advise on recommendations for pt.  Allergies  Allergen Reactions  . Chlordiazepoxide-Clidinium Other (See Comments)    Sleepy,weak  . Biaxin [Clarithromycin] Other (See Comments)    Foul taste, abd pain, diarrhea  . Tramadol Other (See Comments)    Caused tremors  . Codeine Other (See Comments)    REACTION: gi upset- only in high doses per pt.  Can tolerate cough syrup with codeine.      Current Outpatient Medications:  .  acetaminophen (TYLENOL) 500 MG tablet, Take 1,000 mg by mouth every 6 (six) hours as needed for headache (pain). , Disp: , Rfl:  .  ALPRAZolam (XANAX) 0.5 MG tablet, take 1/2 to 1 tablet by mouth twice a day, Disp: 60 tablet, Rfl: 4 .  aspirin 325 MG tablet, Take 325 mg by mouth daily.  , Disp: , Rfl:  .  B-D UF III MINI PEN NEEDLES 31G X 5 MM MISC, USE 5 PER DAY TO INJECT INSULIN, Disp: , Rfl: 0 .  B-D ULTRAFINE III SHORT PEN 31G X 8 MM MISC, TEST 3 TIMES A DAY, Disp: 100 each, Rfl: 5 .  benzonatate (TESSALON) 200 MG capsule, take 1 capsule by mouth three times a day if needed for cough, Disp: 30 capsule, Rfl: 3 .  cholestyramine (QUESTRAN) 4 g packet, take 1 packet once daily if needed for diarrhea, Disp: 60 packet, Rfl: 1 .  dicyclomine (BENTYL) 10 MG capsule, take 1 capsule by mouth once daily (Patient taking differently: take 1 capsule by mouth daily as needed for stomach pain), Disp: 30 capsule, Rfl: 5 .  glucose blood (ACCU-CHEK AVIVA PLUS) test strip, Use as instructed to check blood sugars 8 times per day dx code 250.02, Disp: 250 each, Rfl: 3 .  hydrocortisone (ANUSOL-HC) 2.5 % rectal cream, Place 1 application rectally 3 (three) times daily., Disp: 30 g, Rfl: 0 .  insulin aspart (NOVOLOG FLEXPEN)  100 UNIT/ML FlexPen, Inject 30 units at breakfast- 24 units at lunch- and 30 units at supper (Patient taking differently: Inject 20-34 Units into the skin See admin instructions. Inject 20-34 units subcutaneously three times daily - 20-24 units before breakfast, 22-24 units before lunch, 34 units before supper), Disp: 10 pen, Rfl: 4 .  Insulin Syringe-Needle U-100 (BD INSULIN SYRINGE ULTRAFINE) 31G X 15/64" 0.5 ML MISC, Use 2 per day dx code E11.65, Disp: 100 each, Rfl: 5 .  Lancets (ACCU-CHEK MULTICLIX) lancets, Use to check blood sugar 8 times per day. Dx code E11.9, Disp: 700 each, Rfl: 2 .  magic mouthwash SOLN, Swish with 2 teaspoonfuls by mouth as needed (Patient taking differently: Take by mouth See admin instructions. Swish and spit small amount two times daily as needed for mouth sore), Disp: 240 mL, Rfl: 3 .  meclizine (ANTIVERT) 25 MG tablet, take 1 tablet by mouth three times a day if needed for dizziness (Patient taking differently: take 1 tablet by mouth three times a day if needed for dizziness/vertigo), Disp: 90 tablet, Rfl: 3 .  metoCLOPramide (REGLAN) 5 MG tablet, Take 1 tablet (5 mg total) by mouth every 8 (eight) hours as needed for nausea., Disp: 90 tablet, Rfl: 3 .  metoprolol succinate (TOPROL-XL) 25 MG 24  hr tablet, take 1 tablet by mouth once daily, Disp: 30 tablet, Rfl: 5 .  NOVOLOG FLEXPEN 100 UNIT/ML FlexPen, inject 18 units subcutaneously daily WITH BREAKFAST 22 units WITH LUNCH AND 24 UNITS AT SUPPER, Disp: 30 mL, Rfl: 3 .  omeprazole (PRILOSEC) 20 MG capsule, take 1 capsule by mouth once daily, Disp: 90 capsule, Rfl: 2 .  OXYGEN, Inhale 3 L into the lungs continuous. , Disp: , Rfl:  .  pravastatin (PRAVACHOL) 20 MG tablet, Take 1 tablet (20 mg total) by mouth daily., Disp: 90 tablet, Rfl: 5 .  predniSONE (DELTASONE) 10 MG tablet, 37mX2 days, 348mX2 days, 2080m2 days, 98m57mdays, then stop., Disp: 20 tablet, Rfl: 0 .  promethazine (PHENERGAN) 25 MG tablet, Take 1 tablet  (25 mg total) by mouth every 6 (six) hours as needed for nausea or vomiting., Disp: 30 tablet, Rfl: 0 .  STIOLTO RESPIMAT 2.5-2.5 MCG/ACT AERS, inhale 2 puffs once daily, Disp: 4 g, Rfl: 5 .  TRESIBA FLEXTOUCH 200 UNIT/ML SOPN, inject 94 units subcutaneously once daily, Disp: 18 mL, Rfl: 3 .  venlafaxine XR (EFFEXOR XR) 37.5 MG 24 hr capsule, Take 1 capsule (37.5 mg total) by mouth daily with breakfast., Disp: 30 capsule, Rfl: 1 .  XOPENEX HFA 45 MCG/ACT inhaler, inhale 2 puffs by mouth every 6 hours if needed for wheezing or shortness of breath, Disp: 15 g, Rfl: 11

## 2017-08-09 DIAGNOSIS — M5136 Other intervertebral disc degeneration, lumbar region: Secondary | ICD-10-CM | POA: Diagnosis not present

## 2017-08-09 DIAGNOSIS — I10 Essential (primary) hypertension: Secondary | ICD-10-CM | POA: Diagnosis not present

## 2017-08-09 DIAGNOSIS — Z6834 Body mass index (BMI) 34.0-34.9, adult: Secondary | ICD-10-CM | POA: Diagnosis not present

## 2017-08-09 DIAGNOSIS — M47812 Spondylosis without myelopathy or radiculopathy, cervical region: Secondary | ICD-10-CM | POA: Diagnosis not present

## 2017-08-10 ENCOUNTER — Telehealth: Payer: Self-pay | Admitting: Internal Medicine

## 2017-08-10 ENCOUNTER — Ambulatory Visit (INDEPENDENT_AMBULATORY_CARE_PROVIDER_SITE_OTHER): Payer: Medicare Other | Admitting: Internal Medicine

## 2017-08-10 ENCOUNTER — Encounter: Payer: Self-pay | Admitting: Internal Medicine

## 2017-08-10 DIAGNOSIS — J449 Chronic obstructive pulmonary disease, unspecified: Secondary | ICD-10-CM | POA: Diagnosis not present

## 2017-08-10 DIAGNOSIS — J9611 Chronic respiratory failure with hypoxia: Secondary | ICD-10-CM | POA: Diagnosis not present

## 2017-08-10 MED ORDER — SODIUM CHLORIDE 3 % IN NEBU: INHALATION_SOLUTION | RESPIRATORY_TRACT | 12 refills | Status: DC

## 2017-08-10 NOTE — Telephone Encounter (Signed)
lmtcb x1 for pt. 

## 2017-08-10 NOTE — Patient Instructions (Signed)
Ok to continue with [employer] and the xopenex inhaler  Script sent for hypertonic saline. You can try using your nebulizer machine to give yourself a breathing treatment with this occasionally to  See if it will loosen mucus so you can cough it out.   Please call if we can help

## 2017-08-10 NOTE — Telephone Encounter (Signed)
Called [facility] and spoke with [facility] who was needing a verification on pt's hypertonic Rx.  I stated to [facility], it was hypertonic 3% neb sol for pt to do 1 ampule neb every 6 hours if needed to clear mucus.  Per Delana Meyer, [facility] is out of this med and will not have it in stock until Monday, but she states pt was desperate to start using it right away.  Dr. Annamaria Boots, please advise if you want to change pt's med as she is wanting to start something ASAP, or if you want [place] to call another pharmacy other than [facility] to see if they have med in stock.  Thanks!  Allergies  Allergen Reactions  . Chlordiazepoxide-Clidinium Other (See Comments)    Sleepy,weak  . Biaxin [Clarithromycin] Other (See Comments)    Foul taste, abd pain, diarrhea  . Tramadol Other (See Comments)    Caused tremors  . Codeine Other (See Comments)    REACTION: gi upset- only in high doses per pt.  Can tolerate cough syrup with codeine.      Current Outpatient Medications:  .  acetaminophen (TYLENOL) 500 MG tablet, Take 1,000 mg by mouth every 6 (six) hours as needed for headache (pain). , Disp: , Rfl:  .  ALPRAZolam (XANAX) 0.5 MG tablet, take 1/2 to 1 tablet by mouth twice a day, Disp: 60 tablet, Rfl: 4 .  amoxicillin (AMOXIL) 500 MG capsule, Take 1 capsule (500 mg total) by mouth 2 (two) times daily., Disp: 14 capsule, Rfl: 0 .  aspirin 325 MG tablet, Take 325 mg by mouth daily.  , Disp: , Rfl:  .  B-D UF III MINI PEN NEEDLES 31G X 5 MM MISC, USE 5 PER DAY TO INJECT INSULIN, Disp: , Rfl: 0 .  B-D ULTRAFINE III SHORT PEN 31G X 8 MM MISC, TEST 3 TIMES A DAY, Disp: 100 each, Rfl: 5 .  benzonatate (TESSALON) 200 MG capsule, take 1 capsule by mouth three times a day if needed for cough, Disp: 30 capsule, Rfl: 3 .  cholestyramine (QUESTRAN) 4 g packet, take 1 packet once daily if needed for diarrhea, Disp: 60 packet, Rfl: 1 .  dicyclomine (BENTYL) 10 MG capsule, take 1 capsule by mouth once daily (Patient taking  differently: take 1 capsule by mouth daily as needed for stomach pain), Disp: 30 capsule, Rfl: 5 .  glucose blood (ACCU-CHEK AVIVA PLUS) test strip, Use as instructed to check blood sugars 8 times per day dx code 250.02, Disp: 250 each, Rfl: 3 .  hydrocortisone (ANUSOL-HC) 2.5 % rectal cream, Place 1 application rectally 3 (three) times daily., Disp: 30 g, Rfl: 0 .  insulin aspart (NOVOLOG FLEXPEN) 100 UNIT/ML FlexPen, Inject 30 units at breakfast- 24 units at lunch- and 30 units at supper (Patient taking differently: Inject 20-34 Units into the skin See admin instructions. Inject 20-34 units subcutaneously three times daily - 20-24 units before breakfast, 22-24 units before lunch, 34 units before supper), Disp: 10 pen, Rfl: 4 .  Insulin Syringe-Needle U-100 (BD INSULIN SYRINGE ULTRAFINE) 31G X 15/64" 0.5 ML MISC, Use 2 per day dx code E11.65, Disp: 100 each, Rfl: 5 .  Lancets (ACCU-CHEK MULTICLIX) lancets, Use to check blood sugar 8 times per day. Dx code E11.9, Disp: 700 each, Rfl: 2 .  magic mouthwash SOLN, Swish with 2 teaspoonfuls by mouth as needed (Patient taking differently: Take by mouth See admin instructions. Swish and spit small amount two times daily as needed for mouth sore), Disp: 240  mL, Rfl: 3 .  meclizine (ANTIVERT) 25 MG tablet, take 1 tablet by mouth three times a day if needed for dizziness (Patient taking differently: take 1 tablet by mouth three times a day if needed for dizziness/vertigo), Disp: 90 tablet, Rfl: 3 .  metoCLOPramide (REGLAN) 5 MG tablet, Take 1 tablet (5 mg total) by mouth every 8 (eight) hours as needed for nausea., Disp: 90 tablet, Rfl: 3 .  metoprolol succinate (TOPROL-XL) 25 MG 24 hr tablet, take 1 tablet by mouth once daily, Disp: 30 tablet, Rfl: 5 .  NOVOLOG FLEXPEN 100 UNIT/ML FlexPen, inject 18 units subcutaneously daily WITH BREAKFAST 22 units WITH LUNCH AND 24 UNITS AT SUPPER, Disp: 30 mL, Rfl: 3 .  omeprazole (PRILOSEC) 20 MG capsule, take 1 capsule by  mouth once daily, Disp: 90 capsule, Rfl: 2 .  OXYGEN, Inhale 3 L into the lungs continuous. , Disp: , Rfl:  .  pravastatin (PRAVACHOL) 20 MG tablet, Take 1 tablet (20 mg total) by mouth daily., Disp: 90 tablet, Rfl: 5 .  predniSONE (DELTASONE) 10 MG tablet, 41mX2 days, 341mX2 days, 209m2 days, 21m58mdays, then stop., Disp: 20 tablet, Rfl: 0 .  promethazine (PHENERGAN) 25 MG tablet, Take 1 tablet (25 mg total) by mouth every 6 (six) hours as needed for nausea or vomiting., Disp: 30 tablet, Rfl: 0 .  sodium chloride HYPERTONIC 3 % nebulizer solution, 1 ampule neb every 6 hours if needed to clear mucus, Disp: 75 mL, Rfl: 12 .  STIOLTO RESPIMAT 2.5-2.5 MCG/ACT AERS, inhale 2 puffs once daily, Disp: 4 g, Rfl: 5 .  TRESIBA FLEXTOUCH 200 UNIT/ML SOPN, inject 94 units subcutaneously once daily, Disp: 18 mL, Rfl: 3 .  venlafaxine XR (EFFEXOR XR) 37.5 MG 24 hr capsule, Take 1 capsule (37.5 mg total) by mouth daily with breakfast., Disp: 30 capsule, Rfl: 1 .  XOPENEX HFA 45 MCG/ACT inhaler, inhale 2 puffs by mouth every 6 hours if needed for wheezing or shortness of breath, Disp: 15 g, Rfl: 11

## 2017-08-10 NOTE — Progress Notes (Signed)
Patient ID: [name], female    DOB: Sep 12, 1935, 82 y.o.   MRN: [id number]  HPI Female former smoker followed for COPD, chronic hypoxic respiratory failure, complicated by chronic rhinitis, GERD, DM, glaucoma 2012- Walk test Patient Saturations on Room Air while Ambulating = 84% Patient Saturations on 2.5 Liters of oxygen while Ambulating = 92%  PFT: 01/16/2012 severe obstructive airways disease with insignificant response to bronchodilator, air trapping, diffusion moderately reduced. FEV1 0.82/45%, FEV1/FEC 0.46. Emphysema pattern on the loop. TLC 100%, RV 148%, DLCO 47%. ---------------------------------------------------------------------  02/09/17- 82 year old female former smoker followed for COPD, chronic hypoxic respiratory failure, complicated by chronic rhinitis, GERD, DM2,  back pain/scoliosis O2 3-3.5L/ Advanced COPD; Pt states she continues to have hard time with SOB since last visit. Continues O2 through [facility].  Xopenex HFA, [place], Being evaluated by GI for chronic diarrhea-possibly diabetic No recent change in breathing. Gets for little exercise and realizes deconditioning as part of problem. Little cough or wheeze. Stable dyspnea on exertion. Xopenex HFA helpful, used about twice daily. Not clear that Stiolto helps.  08/10/17 82 year old female former smoker followed for COPD, chronic hypoxic respiratory failure, complicated by chronic rhinitis, GERD, DM2,  back pain/scoliosis O2 3-3.5L/ Advanced Xopenex HFA, Stiolto Respimat, ----copd-cough, mucus feels stuck , increased in chest tightness just below the right breast She has an aide now and although she complained of significant respiratory limitation, chronic cough and chronic back pain related to her scoliosis, she really was cheerful and talkative.  Amoxicillin.  Feels unable to clear secretions.  We have used a flutter device before. Dependent on oxygen. CXR 05/17/17  No active cardiopulmonary disease.  ROS-see HPI   + = positive Constitutional:   No-   weight loss, night sweats, fevers, chills, fatigue, lassitude. HEENT:   No-  headaches, difficulty swallowing, tooth/dental problems, sore throat,       No-  sneezing, itching, ear ache, nasal congestion, post nasal drip,  CV:  No-   chest pain, orthopnea, PND, swelling in lower extremities, anasarca,                                                     dizziness, palpitations Resp: +shortness of breath with exertion or at rest.               productive cough,  + non-productive cough,  No- coughing up of blood.              No-   change in color of mucus.   wheezing.   Skin: No-   rash or lesions. GI:  +heartburn, indigestion, No-abdominal pain, nausea, vomiting,  GU: . MS:  No-   joint pain or swelling.   + back pain Neuro-     nothing unusual Psych:  No- change in mood or affect. +depression or anxiety.  No memory loss.    Objective:  OBJ- Physical Exam    + Very talkative General- Alert, Oriented, Affect-appropriate, Distress- none acute, + overweight, +O2 Skin- rash-none, lesions- none, excoriation- none Lymphadenopathy- none Head- atraumatic            Eyes- Gross vision intact, PERRLA, conjunctivae and secretions clear            Ears- Hearing, canals-normal            Nose- Clear, no-Septal  dev, mucus, polyps, erosion, perforation             Throat- Mallampati IV , mucosa -no thrush , drainage- none, tonsils- atrophic, + hoarse,                    stridor-none,  + missing teeth Neck- flexible , trachea midline, no stridor , thyroid nl, carotid no bruit Chest - symmetrical excursion , unlabored           Heart/CV- RRR , no murmur , no gallop  , no rub, nl s1 s2                           - JVD- none , edema- none, stasis changes- none, varices- none           Lung-  + distant but clear, unlabored, wheeze-none, cough- none while here , dullness-none, rub- none           Chest wall-  Abd-  Br/ Gen/ Rectal- Not done, not indicated Extrem-  cyanosis- none, clubbing, none, atrophy- none, strength- nl Neuro- grossly intact to observation

## 2017-08-10 NOTE — Telephone Encounter (Signed)
Pt is calling back [phone]

## 2017-08-10 NOTE — Telephone Encounter (Signed)
Called and spoke with patient. Reassured pt as per Dr. Annamaria Boots suggested. Patient upset about a variety of things including [facility] hasn't been bringing her oxygen and it worries her. Patient stated she will call [facility] to check on the status of the oxygen. Patient stated she will check back [facility] on Monday and if they don't have the medication she will transfer the rx to [facility]. I called [facility] and they are currently out of the 3% solution but will have it back in stock on Monday 4/5. Patient thanked staff for calling and listening. Nothing further is needed at this time.

## 2017-08-10 NOTE — Telephone Encounter (Signed)
The hypertonic saline is what we want. The patient is medically ok to wait through the weekend. If [facility] could find it at another of their stores and transfer it, that would be ok. Maybe you can reassure [name]. This is not like delaying an antibiotic.

## 2017-08-12 NOTE — Assessment & Plan Note (Signed)
Chronic bronchitis/COPD with limited ability to clear secretions.  She is sensitive to adrenergic stimulants. Plan-try nebulizing hypertonic saline

## 2017-08-12 NOTE — Assessment & Plan Note (Signed)
She uses oxygen appropriately and continuously.

## 2017-08-13 ENCOUNTER — Ambulatory Visit: Payer: Medicare Other | Admitting: Gastroenterology

## 2017-08-14 ENCOUNTER — Telehealth: Payer: Self-pay | Admitting: Internal Medicine

## 2017-08-14 MED ORDER — SODIUM CHLORIDE 3 % IN NEBU: INHALATION_SOLUTION | RESPIRATORY_TRACT | 12 refills | Status: DC

## 2017-08-14 MED ORDER — AMOXICILLIN 500 MG PO CAPS: 500.0000 mg | ORAL_CAPSULE | Freq: Two times a day (BID) | ORAL | 0 refills | Status: DC

## 2017-08-14 NOTE — Telephone Encounter (Signed)
Faxed pt's neb sol Rx to [facility] pharmacy as that is the pharmacy [facility] uses.  [place] and spoke to [place] regarding this and per [place], pt is aware that the Rx would be sent to [facility].  Nothing further needed at this time.

## 2017-08-14 NOTE — Telephone Encounter (Signed)
[place] and spoke with Kristin Bruins letting her know pt's neb sol Rx could be transferred to them.  Keri asked if I could give a verbal instead, and the verbal Rx was given.  Also sent another script of pt's amoxicillin to [facility] for pt as well.  Called [facility] and left a message stating we were cancelling pt's hypertonic neb sol Rx.  Called pt letting her know Rx was sent to [facility] for the neb sol and cancelled at [facility], and also that another round of abx was sent to [facility] as well.  Pt expressed understanding. Nothing further needed at this time.

## 2017-08-14 NOTE — Telephone Encounter (Signed)
Keri, [place] is asking if neb solution RX can be sent over so they may price this for the patient.  CB is [phone].

## 2017-08-14 NOTE — Telephone Encounter (Signed)
Ok to switch her script for hypertonic saline neb solution if her DME or [facility] can get it cheaper.  Ok to repeat her recent script for amoxacillin.

## 2017-08-14 NOTE — Telephone Encounter (Signed)
Called pt who stated she received a call from [place] letting her know the neb sol Rx was ready for pick up.  Pt stated the cost for the solution was too much for her and stated she was going to see if the rx could be transferred to [facility].  I stated to pt if it was still too expensive for her at [facility], we could send the Rx to [facility].  While speaking with pt, she stated to me she started coughing up green mucus again and wanted [facility] to be made aware of it.  She stated she took her last abx pill 08/10/17 evening, and for a couple days she was not coughing up any green mucus but then this morning, 08/14/17 it all started again.  Dr. Annamaria Boots, please advise on recommendations for pt to help with the green mucus she is coughing up again. Thanks!  Allergies  Allergen Reactions  . Chlordiazepoxide-Clidinium Other (See Comments)    Sleepy,weak  . Biaxin [Clarithromycin] Other (See Comments)    Foul taste, abd pain, diarrhea  . Tramadol Other (See Comments)    Caused tremors  . Codeine Other (See Comments)    REACTION: gi upset- only in high doses per pt.  Can tolerate cough syrup with codeine.      Current Outpatient Medications:  .  acetaminophen (TYLENOL) 500 MG tablet, Take 1,000 mg by mouth every 6 (six) hours as needed for headache (pain). , Disp: , Rfl:  .  ALPRAZolam (XANAX) 0.5 MG tablet, take 1/2 to 1 tablet by mouth twice a day, Disp: 60 tablet, Rfl: 4 .  amoxicillin (AMOXIL) 500 MG capsule, Take 1 capsule (500 mg total) by mouth 2 (two) times daily., Disp: 14 capsule, Rfl: 0 .  aspirin 325 MG tablet, Take 325 mg by mouth daily.  , Disp: , Rfl:  .  B-D UF III MINI PEN NEEDLES 31G X 5 MM MISC, USE 5 PER DAY TO INJECT INSULIN, Disp: , Rfl: 0 .  B-D ULTRAFINE III SHORT PEN 31G X 8 MM MISC, TEST 3 TIMES A DAY, Disp: 100 each, Rfl: 5 .  benzonatate (TESSALON) 200 MG capsule, take 1 capsule by mouth three times a day if needed for cough, Disp: 30 capsule, Rfl: 3 .   cholestyramine (QUESTRAN) 4 g packet, take 1 packet once daily if needed for diarrhea, Disp: 60 packet, Rfl: 1 .  dicyclomine (BENTYL) 10 MG capsule, take 1 capsule by mouth once daily (Patient taking differently: take 1 capsule by mouth daily as needed for stomach pain), Disp: 30 capsule, Rfl: 5 .  glucose blood (ACCU-CHEK AVIVA PLUS) test strip, Use as instructed to check blood sugars 8 times per day dx code 250.02, Disp: 250 each, Rfl: 3 .  hydrocortisone (ANUSOL-HC) 2.5 % rectal cream, Place 1 application rectally 3 (three) times daily., Disp: 30 g, Rfl: 0 .  insulin aspart (NOVOLOG FLEXPEN) 100 UNIT/ML FlexPen, Inject 30 units at breakfast- 24 units at lunch- and 30 units at supper (Patient taking differently: Inject 20-34 Units into the skin See admin instructions. Inject 20-34 units subcutaneously three times daily - 20-24 units before breakfast, 22-24 units before lunch, 34 units before supper), Disp: 10 pen, Rfl: 4 .  Insulin Syringe-Needle U-100 (BD INSULIN SYRINGE ULTRAFINE) 31G X 15/64" 0.5 ML MISC, Use 2 per day dx code E11.65, Disp: 100 each, Rfl: 5 .  Lancets (ACCU-CHEK MULTICLIX) lancets, Use to check blood sugar 8 times per day. Dx code  E11.9, Disp: 700 each, Rfl: 2 .  magic mouthwash SOLN, Swish with 2 teaspoonfuls by mouth as needed (Patient taking differently: Take by mouth See admin instructions. Swish and spit small amount two times daily as needed for mouth sore), Disp: 240 mL, Rfl: 3 .  meclizine (ANTIVERT) 25 MG tablet, take 1 tablet by mouth three times a day if needed for dizziness (Patient taking differently: take 1 tablet by mouth three times a day if needed for dizziness/vertigo), Disp: 90 tablet, Rfl: 3 .  metoCLOPramide (REGLAN) 5 MG tablet, Take 1 tablet (5 mg total) by mouth every 8 (eight) hours as needed for nausea., Disp: 90 tablet, Rfl: 3 .  metoprolol succinate (TOPROL-XL) 25 MG 24 hr tablet, take 1 tablet by mouth once daily, Disp: 30 tablet, Rfl: 5 .  NOVOLOG  FLEXPEN 100 UNIT/ML FlexPen, inject 18 units subcutaneously daily WITH BREAKFAST 22 units WITH LUNCH AND 24 UNITS AT SUPPER, Disp: 30 mL, Rfl: 3 .  omeprazole (PRILOSEC) 20 MG capsule, take 1 capsule by mouth once daily, Disp: 90 capsule, Rfl: 2 .  OXYGEN, Inhale 3 L into the lungs continuous. , Disp: , Rfl:  .  pravastatin (PRAVACHOL) 20 MG tablet, Take 1 tablet (20 mg total) by mouth daily., Disp: 90 tablet, Rfl: 5 .  predniSONE (DELTASONE) 10 MG tablet, 65mX2 days, 320mX2 days, 2058m2 days, 84m71mdays, then stop., Disp: 20 tablet, Rfl: 0 .  promethazine (PHENERGAN) 25 MG tablet, Take 1 tablet (25 mg total) by mouth every 6 (six) hours as needed for nausea or vomiting., Disp: 30 tablet, Rfl: 0 .  sodium chloride HYPERTONIC 3 % nebulizer solution, 1 ampule neb every 6 hours if needed to clear mucus, Disp: 75 mL, Rfl: 12 .  STIOLTO RESPIMAT 2.5-2.5 MCG/ACT AERS, inhale 2 puffs once daily, Disp: 4 g, Rfl: 5 .  TRESIBA FLEXTOUCH 200 UNIT/ML SOPN, inject 94 units subcutaneously once daily, Disp: 18 mL, Rfl: 3 .  venlafaxine XR (EFFEXOR XR) 37.5 MG 24 hr capsule, Take 1 capsule (37.5 mg total) by mouth daily with breakfast., Disp: 30 capsule, Rfl: 1 .  XOPENEX HFA 45 MCG/ACT inhaler, inhale 2 puffs by mouth every 6 hours if needed for wheezing or shortness of breath, Disp: 15 g, Rfl: 11

## 2017-08-15 ENCOUNTER — Telehealth: Payer: Self-pay | Admitting: Internal Medicine

## 2017-08-15 NOTE — Telephone Encounter (Signed)
ATC x 2 and line was busy  [facility]

## 2017-08-15 NOTE — Telephone Encounter (Signed)
Spoke with patient. She stated that the hypertonic solution was called into [facility] on [address]. She is confused about the wording on the box.   I had the patient to read out the name on the solution box, it matches what we have the system, but per her chart, the RX was sent to [place].   Patient stated that she contacted [facility] and they did not have the RX. Advised patient that it sometimes takes up to a day for the pharmacy to receive the RX. Also asked her how did she get the solution if it was never called into [facility] on [address]. She said [facility].   Upon looking into patient's chart, solution was sent to [facility] but then someone left a VM to cancel the solution. [facility] did not catch the VM, therefore sold the patient the medication.   Left message for patient to call back.

## 2017-08-15 NOTE — Telephone Encounter (Signed)
Spoke with pt. She is aware that she will need to contact [place] to check on the status of nebulizer medication. Per our documentation, this was sent to the on 08/14/17. [facility]'s phone number was given to the pt. Nothing further was needed.

## 2017-08-16 NOTE — Telephone Encounter (Signed)
Called and spoke to patient. Patient stated that she has the hypertonic solution but hasn't started using it yet but plans to today. Patient stated that currently there is nothing needed because she just wants to give it a try for a while and see how it does. Nothing further is needed at this time.

## 2017-08-20 ENCOUNTER — Other Ambulatory Visit: Payer: Self-pay | Admitting: Internal Medicine

## 2017-08-23 ENCOUNTER — Other Ambulatory Visit: Payer: Self-pay | Admitting: *Deleted

## 2017-08-23 MED ORDER — OMEPRAZOLE 20 MG PO CPDR: 20.0000 mg | DELAYED_RELEASE_CAPSULE | Freq: Every day | ORAL | 2 refills | Status: DC

## 2017-08-23 MED ORDER — METOPROLOL SUCCINATE ER 25 MG PO TB24: 25.0000 mg | ORAL_TABLET | Freq: Every day | ORAL | 2 refills | Status: DC

## 2017-08-27 ENCOUNTER — Telehealth: Payer: Self-pay | Admitting: Internal Medicine

## 2017-08-27 NOTE — Telephone Encounter (Signed)
If hypertonic saline nebs aren't helping, then stop. Im not sure an antibiotic will help. But can offer amoxacillin 500 mg, # 14, 1 twice daily  Also be good for her to try a steroid inhaler like Flovent 110 hfa, with an aerochamber spacer to reduce risk of thrush.  Inhale 2 puffs through spacer, then rinse mouth, twice daily, ref x 12

## 2017-08-27 NOTE — Telephone Encounter (Signed)
Spoke with pt. States that she is not feeling any better since seeing Dr. Annamaria Boots earlier this month. Reports cough, chest congestion and hoarseness. At her last OV, Dr. Annamaria Boots prescribed her hypertonic nebulizer solution. She feels like this is making her symptoms worse. I attempted to make an appointment for the pt but she declined. She also declined her message being sent to another provider, she only wants Dr. Annamaria Boots to address this message.  Dr. Annamaria Boots - please advise. Thanks.  Allergies  Allergen Reactions  . Chlordiazepoxide-Clidinium Other (See Comments)    Sleepy,weak  . Biaxin [Clarithromycin] Other (See Comments)    Foul taste, abd pain, diarrhea  . Tramadol Other (See Comments)    Caused tremors  . Codeine Other (See Comments)    REACTION: gi upset- only in high doses per pt.  Can tolerate cough syrup with codeine.    Current Outpatient Medications on File Prior to Visit  Medication Sig Dispense Refill  . acetaminophen (TYLENOL) 500 MG tablet Take 1,000 mg by mouth every 6 (six) hours as needed for headache (pain).     [facility] ALPRAZolam (XANAX) 0.5 MG tablet take 1/2 to 1 tablet by mouth twice a day 60 tablet 4  . amoxicillin (AMOXIL) 500 MG capsule Take 1 capsule (500 mg total) by mouth 2 (two) times daily. 14 capsule 0  . aspirin 325 MG tablet Take 325 mg by mouth daily.      . B-D UF III MINI PEN NEEDLES 31G X 5 MM MISC USE 5 PER DAY TO INJECT INSULIN  0  . B-D ULTRAFINE III SHORT PEN 31G X 8 MM MISC TEST 3 TIMES A DAY 100 each 5  . benzonatate (TESSALON) 200 MG capsule TAKE 1 CAPSULE BY MOUTH THREE TIMES A DAY IF NEEDED FOR COUGH 9 capsule 0  . cholestyramine (QUESTRAN) 4 g packet take 1 packet once daily if needed for diarrhea 60 packet 1  . dicyclomine (BENTYL) 10 MG capsule take 1 capsule by mouth once daily (Patient taking differently: take 1 capsule by mouth daily as needed for stomach pain) 30 capsule 5  . glucose blood (ACCU-CHEK AVIVA PLUS) test strip Use as instructed  to check blood sugars 8 times per day dx code 250.02 250 each 3  . hydrocortisone (ANUSOL-HC) 2.5 % rectal cream Place 1 application rectally 3 (three) times daily. 30 g 0  . insulin aspart (NOVOLOG FLEXPEN) 100 UNIT/ML FlexPen Inject 30 units at breakfast- 24 units at lunch- and 30 units at supper (Patient taking differently: Inject 20-34 Units into the skin See admin instructions. Inject 20-34 units subcutaneously three times daily - 20-24 units before breakfast, 22-24 units before lunch, 34 units before supper) 10 pen 4  . Insulin Syringe-Needle U-100 (BD INSULIN SYRINGE ULTRAFINE) 31G X 15/64" 0.5 ML MISC Use 2 per day dx code E11.65 100 each 5  . Lancets (ACCU-CHEK MULTICLIX) lancets Use to check blood sugar 8 times per day. Dx code E11.9 700 each 2  . magic mouthwash SOLN Swish with 2 teaspoonfuls by mouth as needed (Patient taking differently: Take by mouth See admin instructions. Swish and spit small amount two times daily as needed for mouth sore) 240 mL 3  . meclizine (ANTIVERT) 25 MG tablet take 1 tablet by mouth three times a day if needed for dizziness (Patient taking differently: take 1 tablet by mouth three times a day if needed for dizziness/vertigo) 90 tablet 3  . metoCLOPramide (REGLAN) 5 MG tablet Take 1 tablet (5 mg  total) by mouth every 8 (eight) hours as needed for nausea. 90 tablet 3  . metoprolol succinate (TOPROL-XL) 25 MG 24 hr tablet Take 1 tablet (25 mg total) by mouth daily. 90 tablet 2  . NOVOLOG FLEXPEN 100 UNIT/ML FlexPen inject 18 units subcutaneously daily WITH BREAKFAST 22 units WITH LUNCH AND 24 UNITS AT SUPPER 30 mL 3  . omeprazole (PRILOSEC) 20 MG capsule Take 1 capsule (20 mg total) by mouth daily. 90 capsule 2  . OXYGEN Inhale 3 L into the lungs continuous.     . pravastatin (PRAVACHOL) 20 MG tablet Take 1 tablet (20 mg total) by mouth daily. 90 tablet 5  . predniSONE (DELTASONE) 10 MG tablet 19mX2 days, 39mX2 days, 2049m2 days, 41m26mdays, then stop. 20  tablet 0  . promethazine (PHENERGAN) 25 MG tablet Take 1 tablet (25 mg total) by mouth every 6 (six) hours as needed for nausea or vomiting. 30 tablet 0  . sodium chloride HYPERTONIC 3 % nebulizer solution 1 ampule neb every 6 hours if needed to clear mucus 75 mL 12  . STIOLTO RESPIMAT 2.5-2.5 MCG/ACT AERS inhale 2 puffs once daily 4 g 5  . TRESIBA FLEXTOUCH 200 UNIT/ML SOPN inject 94 units subcutaneously once daily 18 mL 3  . venlafaxine XR (EFFEXOR XR) 37.5 MG 24 hr capsule Take 1 capsule (37.5 mg total) by mouth daily with breakfast. 30 capsule 1  . XOPENEX HFA 45 MCG/ACT inhaler inhale 2 puffs by mouth every 6 hours if needed for wheezing or shortness of breath 15 g 11   No current facility-administered medications on file prior to visit.

## 2017-08-28 MED ORDER — AEROCHAMBER MV MISC: 0 refills | Status: DC

## 2017-08-28 MED ORDER — FLUTICASONE PROPIONATE HFA 110 MCG/ACT IN AERO: 2.0000 | INHALATION_SPRAY | Freq: Two times a day (BID) | RESPIRATORY_TRACT | 12 refills | Status: DC

## 2017-08-28 MED ORDER — AMOXICILLIN 500 MG PO CAPS: 500.0000 mg | ORAL_CAPSULE | Freq: Two times a day (BID) | ORAL | 0 refills | Status: DC

## 2017-08-28 NOTE — Telephone Encounter (Signed)
Patient aware of recs.  rx's sent to preferred pharmacy and pt states she has lost her spacer, so a new one has been left up front for pickup.  Nothing further needed.

## 2017-08-30 ENCOUNTER — Ambulatory Visit: Payer: Medicare Other | Admitting: Gastroenterology

## 2017-09-05 ENCOUNTER — Encounter: Payer: Self-pay | Admitting: Endocrinology

## 2017-09-05 ENCOUNTER — Ambulatory Visit (INDEPENDENT_AMBULATORY_CARE_PROVIDER_SITE_OTHER): Payer: Medicare Other | Admitting: Endocrinology

## 2017-09-05 VITALS — BP 150/72 | HR 87 | Ht 63.0 in | Wt 191.0 lb

## 2017-09-05 DIAGNOSIS — I10 Essential (primary) hypertension: Secondary | ICD-10-CM | POA: Diagnosis not present

## 2017-09-05 DIAGNOSIS — E1165 Type 2 diabetes mellitus with hyperglycemia: Secondary | ICD-10-CM | POA: Diagnosis not present

## 2017-09-05 DIAGNOSIS — Z794 Long term (current) use of insulin: Secondary | ICD-10-CM

## 2017-09-05 DIAGNOSIS — R197 Diarrhea, unspecified: Secondary | ICD-10-CM

## 2017-09-05 LAB — POCT GLYCOSYLATED HEMOGLOBIN (HGB A1C): HEMOGLOBIN A1C: 9.1

## 2017-09-05 MED ORDER — DICLOFENAC SODIUM 1 % TD GEL: 2.0000 g | Freq: Three times a day (TID) | TRANSDERMAL | 1 refills | Status: DC

## 2017-09-05 MED ORDER — IRBESARTAN 75 MG PO TABS: 75.0000 mg | ORAL_TABLET | Freq: Every day | ORAL | 3 refills | Status: DC

## 2017-09-05 NOTE — Patient Instructions (Addendum)
Novolog 32 at supper  Saline spray 4-5x daily

## 2017-09-05 NOTE — Progress Notes (Signed)
Patient ID: [name], female   DOB: Mar 18, 1936, 82 y.o.   MRN: [id number]   Reason for Appointment: Diabetes and follow-up of multiple problems  History of Present Illness    PROBLEM 1:  DIABETES type II since 1980   She has been on insulin using Lantus and Humalog for several years and usually A1c around 8% despite fairly good compliance with diet, exercise and glucose monitoring. She also had been on Actos previously which probably benefited her especially with her fatty liver but she stopped it because of fear of side effects   She was changed from NovoLog to U-500 insulin starting in 02/2016; she did not related was controlling her sugars and preferred to go back to NovoLog.    Recent history:   Insulin regimen: Tresiba  60 units acb    NOVOLOG before meals 20-24-30 units before meals  Her A1c has been previously consistently around 9- 10% and now 9.1   She has not been checking her sugars as much as before  Was tried on the [employer] system but she thought it was causing itching and she does not think it was as accurate and does not want to use it  FASTING readings are variable but in the last few days sometimes in the 130s  She thinks she forgets to take her mealtime insulin at times and will have readings over 300 as a consequence  Has had only one relatively low blood sugar around lunchtime still requiring relatively large dose of insulin despite her eating less  No other hypoglycemia  Recent weight stable  She has somewhat higher blood sugars at bedtime although has only a few readings  As before her average blood sugar at any given time is about 170-200   Oral hypoglycemic drugs: none       Side effects from medications: None         Proper timing of medications in relation to meals: Yes.          Monitors blood glucose:  About 2 times a day   Glucometer:  Accu-Chek         Blood Glucose averages recently from meter download:  Mean  values apply above for all meters except median for One [employer]  PRE-MEAL Fasting Lunch Dinner Bedtime Overall  Glucose range:  133-309  66-227  132-321  167-329   Mean/median:     190     Meals: 3 meals per day.  Breakfast 7 AM; lunch 1-2 pm Supper 6 to 7 PM     Physical activity: exercise: Unable to do any because of dyspnea and fatigue            Dietician visit: Most recent: Several years ago            Complications: are: Peripheral neuropathy with sensory loss     Wt Readings from Last 3 Encounters:  09/05/17 191 lb (86.6 kg)  08/10/17 189 lb 6.4 oz (85.9 kg)  07/06/17 193 lb 12.8 oz (87.9 kg)    Lab Results  Component Value Date   HGBA1C 9.1 09/05/2017   HGBA1C 9.0 05/07/2017   HGBA1C 8.1 12/20/2016   Lab Results  Component Value Date   MICROALBUR 1.1 05/11/2017   LDLCALC 89 05/07/2017   CREATININE 0.66 05/07/2017     OTHER Current problems  are detailed in review of systems   Allergies as of 09/05/2017      Reactions   Chlordiazepoxide-clidinium Other (See Comments)  Sleepy,weak   Biaxin [clarithromycin] Other (See Comments)   Foul taste, abd pain, diarrhea   Tramadol Other (See Comments)   Caused tremors   Codeine Other (See Comments)   REACTION: gi upset- only in high doses per pt.  Can tolerate cough syrup with codeine.       Medication List        Accurate as of 09/05/17 12:09 PM. Always use your most recent med list.          accu-chek multiclix lancets Use to check blood sugar 8 times per day. Dx code E11.9   acetaminophen 500 MG tablet Commonly known as:  TYLENOL Take 1,000 mg by mouth every 6 (six) hours as needed for headache (pain).   AEROCHAMBER MV inhaler Use as instructed   ALPRAZolam 0.5 MG tablet Commonly known as:  XANAX take 1/2 to 1 tablet by mouth twice a day   aspirin 325 MG tablet Take 325 mg by mouth daily.   B-D UF III MINI PEN NEEDLES 31G X 5 MM Misc Generic drug:  Insulin Pen Needle USE 5 PER DAY TO INJECT  INSULIN   B-D ULTRAFINE III SHORT PEN 31G X 8 MM Misc Generic drug:  Insulin Pen Needle TEST 3 TIMES A DAY   benzonatate 200 MG capsule Commonly known as:  TESSALON TAKE 1 CAPSULE BY MOUTH THREE TIMES A DAY IF NEEDED FOR COUGH   cholestyramine 4 g packet Commonly known as:  QUESTRAN take 1 packet once daily if needed for diarrhea   diclofenac sodium 1 % Gel Commonly known as:  VOLTAREN Apply 2 g topically 3 (three) times daily.   dicyclomine 10 MG capsule Commonly known as:  BENTYL take 1 capsule by mouth once daily   fluticasone 110 MCG/ACT inhaler Commonly known as:  FLOVENT HFA Inhale 2 puffs into the lungs 2 (two) times daily.   glucose blood test strip Commonly known as:  ACCU-CHEK AVIVA PLUS Use as instructed to check blood sugars 8 times per day dx code 250.02   Insulin Syringe-Needle U-100 31G X 15/64" 0.5 ML Misc Commonly known as:  BD INSULIN SYRINGE ULTRAFINE Use 2 per day dx code E11.65   irbesartan 75 MG tablet Commonly known as:  AVAPRO Take 1 tablet (75 mg total) by mouth daily.   magic mouthwash Soln Swish with 2 teaspoonfuls by mouth as needed   meclizine 25 MG tablet Commonly known as:  ANTIVERT take 1 tablet by mouth three times a day if needed for dizziness   metoprolol succinate 25 MG 24 hr tablet Commonly known as:  TOPROL-XL Take 1 tablet (25 mg total) by mouth daily.   NOVOLOG FLEXPEN 100 UNIT/ML FlexPen Generic drug:  insulin aspart inject 18 units subcutaneously daily WITH BREAKFAST 22 units WITH LUNCH AND 24 UNITS AT SUPPER   omeprazole 20 MG capsule Commonly known as:  PRILOSEC Take 1 capsule (20 mg total) by mouth daily.   OXYGEN Inhale 3 L into the lungs continuous.   pravastatin 20 MG tablet Commonly known as:  PRAVACHOL Take 1 tablet (20 mg total) by mouth daily.   promethazine 25 MG tablet Commonly known as:  PHENERGAN Take 1 tablet (25 mg total) by mouth every 6 (six) hours as needed for nausea or vomiting.   sodium  chloride HYPERTONIC 3 % nebulizer solution 1 ampule neb every 6 hours if needed to clear mucus   STIOLTO RESPIMAT 2.5-2.5 MCG/ACT Aers Generic drug:  Tiotropium Bromide-Olodaterol inhale 2 puffs once daily  TRESIBA FLEXTOUCH 200 UNIT/ML Sopn Generic drug:  Insulin Degludec inject 94 units subcutaneously once daily   venlafaxine XR 37.5 MG 24 hr capsule Commonly known as:  EFFEXOR XR Take 1 capsule (37.5 mg total) by mouth daily with breakfast.   XOPENEX HFA 45 MCG/ACT inhaler Generic drug:  levalbuterol inhale 2 puffs by mouth every 6 hours if needed for wheezing or shortness of breath       Allergies:  Allergies  Allergen Reactions  . Chlordiazepoxide-Clidinium Other (See Comments)    Sleepy,weak  . Biaxin [Clarithromycin] Other (See Comments)    Foul taste, abd pain, diarrhea  . Tramadol Other (See Comments)    Caused tremors  . Codeine Other (See Comments)    REACTION: gi upset- only in high doses per pt.  Can tolerate cough syrup with codeine.     Past Medical History:  Diagnosis Date  . Allergic rhinitis, cause unspecified    Sinus CT Rec 12-23-2009  . Arthritis   . Benign paroxysmal positional vertigo 08/09/2012  . 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 Complication of anesthesia    takes along time to wake up  . Diabetes mellitus   . Diarrhea   . Diverticulosis   . Esophageal reflux   . Esophageal stricture   . GERD (gastroesophageal reflux disease)   . Glaucoma   . Hiatal hernia   . Hypertension   . Irritable bowel syndrome   . Neuropathy in diabetes ([place]) 08/09/2012  . Other chronic nonalcoholic liver disease   . Oxygen deficiency   . Sciatica   . Shingles 2010  . Shortness of breath   . Steatohepatitis     Past Surgical History:  Procedure Laterality Date  . APPENDECTOMY    . CARPAL TUNNEL RELEASE     right hand  . CATARACT EXTRACTION     Bilateral  . CHOLECYSTECTOMY OPEN  1978  .  COLONOSCOPY  07-2001   mild diverticulosis  . ESOPHAGOGASTRODUODENOSCOPY  3329,51-88   H Hernia,es.stricture s/p dil 75F  . EYE SURGERY    . LIVER BIOPSY  08-1992  . PARATHYROID EXPLORATION    . TONSILLECTOMY AND ADENOIDECTOMY    . TOTAL ABDOMINAL HYSTERECTOMY    . ULNAR NERVE TRANSPOSITION  12/07/2011   Procedure: ULNAR NERVE DECOMPRESSION/TRANSPOSITION;this was cancelled-not done  Surgeon: Cammie Sickle., MD;  Location: [facility];  Service: Orthopedics;  Laterality: Right;  right ulnar nerve in situ decompression  . ULNAR TUNNEL RELEASE  03/07/2012   Procedure: CUBITAL TUNNEL RELEASE;  Surgeon: Roseanne Kaufman, MD;  Location: [facility];  Service: Orthopedics;  Laterality: Right;  ulnar nerve release at the elbow      Family History  Problem Relation Age of Onset  . Heart disease Father   . Lung cancer Mother        small cell;Byssinosis  . Lung cancer Sister   . Liver cancer Sister        ? mets from another area of the body  . Diabetes Unknown        grandmother  . Stroke Maternal Grandfather     Social History:  reports that she quit smoking about 29 years ago. Her smoking use included cigarettes. She has never used smokeless tobacco. She reports that she does not drink alcohol or use drugs.  Review of Systems:  DIARRHEA: She was recommended taking Colestid as needed when she has diarrhea stopped when  the episode is improved Again she thinks that diarrhea episodes have become less frequent and better controllable she does have unexpected diarrhea episodes with fecal incontinence at times also She is trying to get an appointment with a third gastroenterologist for another opinion     HYPERTENSION:   blood pressure is variably controlled   Currently only on metoprolol, previously on valsartan which was stopped when she had lower blood pressures after hospitalization for diarrhea Her blood pressure fluctuates between 130 and 150 at  home  BP Readings from Last 3 Encounters:  09/05/17 (!) 150/72  08/10/17 132/80  07/06/17 130/72    She takes Xanax at night mostly and lorazepam as needed for anxiety  HYPERLIPIDEMIA:  She has had mixed hyperlipidemia, LDL treated with pravastatin 20 mg with adequate LDL control     Lab Results  Component Value Date   CHOL 158 05/07/2017   HDL 34.40 (L) 05/07/2017   LDLCALC 89 05/07/2017   LDLDIRECT 89.0 10/14/2014   TRIG 175.0 (H) 05/07/2017   CHOLHDL 5 05/07/2017    She is followed by pulmonologist for chronic COPD, taking home oxygen  Complaining about viscous nasal drainage and difficulty clearing her throat with coughing spells Also has one episode of epistaxis but has had dry nose and throat but not using any spray  She has had increased joint pains and back pain Complaining about pain in her hands with some swelling and some joints which may hurt even at night at times   Examination:   BP (!) 150/72 (BP Location: Left Arm, Patient Position: Sitting, Cuff Size: Normal)   Pulse 87   Ht 5' 3"  (1.6 m)   Wt 191 lb (86.6 kg)   SpO2 94%   BMI 33.83 kg/m   Body mass index is 33.83 kg/m.   No ankle edema present Repeat blood pressure about the same  ASSESSMENT/ PLAN:    Diabetes type 2 with obesity, insulin-dependent  See history of present illness for detailed discussion of  current management, blood sugar patterns and problems identified  Her blood sugars are overall high but inconsistent She is not checking blood sugars as much Does appears to have more consistently high readings at bedtime Fasting readings are higher than before but not consistent and as low as 133  She was advised to try and check her sugars more often 50 after supper She will increase her suppertime coverage up to 32 units and if still running high go up to 34 Again needs to continue adjusting her mealtime dose based on what she is planning to eat No change in basal  insulin Discussed freestyle libre system but she does not want to use this as discussed above  A1c is again around 9% as before   DIARRHEA: Still having intermittent episodes, likely to be irritable bowel syndrome Also may have some autonomic neuropathy especially with fecal incontinence  Recommend taking Colestid as needed again on the day she is starting to have diarrhea and follow-up with gastroenterology  HYPERTENSION: Variable control with metoprolol alone and will need to get her back on an ARB since she has periodic high readings and this would be protective in the long run  She can start up 75 mg of Avapro daily  Arthritis of hands: She has significant symptoms and since he cannot take systemic drugs safely long-term will have her take Voltaren gel topically  Total visit time for evaluation and management of multiple problems and counseling =25 minutes   Patient Instructions  Novolog 32 at supper  Saline spray 4-5x daily     [name] 09/05/2017, 12:09 PM

## 2017-09-10 ENCOUNTER — Telehealth: Payer: Self-pay

## 2017-09-10 ENCOUNTER — Other Ambulatory Visit: Payer: Self-pay | Admitting: Endocrinology

## 2017-09-10 MED ORDER — ALPRAZOLAM 0.5 MG PO TABS: ORAL_TABLET | ORAL | 4 refills | Status: DC

## 2017-09-10 NOTE — Telephone Encounter (Signed)
Received fax from [facility] requesting refill on Xanax. If appropriate, would you please refill?

## 2017-09-10 NOTE — Telephone Encounter (Signed)
Prescription sent

## 2017-09-17 ENCOUNTER — Encounter: Payer: Self-pay | Admitting: Physician Assistant

## 2017-09-17 ENCOUNTER — Telehealth: Payer: Self-pay | Admitting: Physician Assistant

## 2017-09-17 ENCOUNTER — Ambulatory Visit (INDEPENDENT_AMBULATORY_CARE_PROVIDER_SITE_OTHER): Payer: Medicare Other | Admitting: Physician Assistant

## 2017-09-17 VITALS — BP 134/70 | HR 100 | Ht 61.0 in | Wt 188.1 lb

## 2017-09-17 DIAGNOSIS — K580 Irritable bowel syndrome with diarrhea: Secondary | ICD-10-CM

## 2017-09-17 DIAGNOSIS — R197 Diarrhea, unspecified: Secondary | ICD-10-CM

## 2017-09-17 DIAGNOSIS — R825 Elevated urine levels of drugs, medicaments and biological substances: Secondary | ICD-10-CM | POA: Diagnosis not present

## 2017-09-17 MED ORDER — RIFAXIMIN 550 MG PO TABS: 550.0000 mg | ORAL_TABLET | Freq: Three times a day (TID) | ORAL | 0 refills | Status: AC

## 2017-09-17 NOTE — Patient Instructions (Addendum)
Take Imodium- one by mouth every morning. We will call when we have the samples of Xifaxan 550 mg.   Call our office the end of May [phone], choose option 2. Make an appointment to see Nicoletta Ba PA  for the week of June 10th.    If you are age 80 or older, your body mass index should be between 23-30. Your Body mass index is 35.55 kg/m. If this is out of the aforementioned range listed, please consider follow up with your Primary Care Provider.

## 2017-09-17 NOTE — Telephone Encounter (Signed)
[place].  They didn't have all of the # 42 tablets of the Xifaxan 550 mg. :They will get the rest in tomorrow.  The pharmacist understood our prescription.  They will call the patient when they get in the medicaiton.

## 2017-09-17 NOTE — Progress Notes (Signed)
Subjective:    Patient ID: [name], female    DOB: 02-16-1936, 82 y.o.   MRN: [id number]  HPI [name] is a pleasant 82 year old white female, new to GI today referred by Dr. Dwyane Dee.  She is known previously to Dr. Delfin Edis, and was last seen here in 2012. Patient comes in with complaints of chronic diarrhea. She has history of long-standing adult onset diabetes mellitus, insulin-dependent, diabetic neuropathy, GERD, IBS, COPD on chronic oxygen, and hypertension. She had undergone colonoscopy in November 2012 for complaints of diarrhea.  This was a normal exam and random biopsies were negative.  EGD at that same time showed a large hiatal hernia, small bowel biopsies were done and negative. Patient says she used to just have spells of diarrhea that would last for a few days and then she would have fairly normal bowel movements in between.  Over the past 10 months she has been having ongoing problems with diarrhea and occasional episodes of incontinence.  She says she gets absolutely no warning at times and has been having accidents.  She says occasionally she will still go 5 or 6 days with fairly normal bowel movements and then starts having what she calls "pudding" consistency of stools.  She also has ongoing complaints of gas bloating had a general decrease in appetite. She has a prescription for dicyclomine but says she only uses that as needed for abdominal cramping.  She had also been given a prescription for Questran in the past but has not used that regularly.    She had a hospitalization last summer for diarrhea, and at that time was seen by [facility]/Dr. Alessandra Bevels.  GI pathogen panel was negative, was given a trial of Colestid.  Colonoscopy was discussed which she initially declined.  She tells me today that she did undergo the colonoscopy as an outpatient but no different medical occasions were offered after that. She was then referred to Dr. Earlean Shawl last fall and seen in his office.   She had stool studies done including stool for lactoferrin negative, fecal elastase negative, celiac markers negative, stool for C. difficile negative, and fecal calprotectin within normal limits.  She states she was advised to start taking VSL 3.  Apparently there was some discrepancy about the dosing of VS L3 and what she was able to get over-the-counter versus prescription.  She says she had a disagreement with Dr. Earlean Shawl and did not return to his care.  Review of Systems Pertinent positive and negative review of systems were noted in the above HPI section.  All other review of systems was otherwise negative.  Outpatient Encounter Medications as of 09/17/2017  Medication Sig  . acetaminophen (TYLENOL) 500 MG tablet Take 1,000 mg by mouth every 6 (six) hours as needed for headache (pain).   Marland Kitchen ALPRAZolam (XANAX) 0.5 MG tablet Half-1 tablet twice a day as needed for anxiety  . magic mouthwash SOLN Swish with 2 teaspoonfuls by mouth as needed (Patient taking differently: Take by mouth See admin instructions. Swish and spit small amount two times daily as needed for mouth sore)  . aspirin 325 MG tablet Take 325 mg by mouth daily.    . B-D UF III MINI PEN NEEDLES 31G X 5 MM MISC USE 5 PER DAY TO INJECT INSULIN  . B-D ULTRAFINE III SHORT PEN 31G X 8 MM MISC TEST 3 TIMES A DAY  . benzonatate (TESSALON) 200 MG capsule TAKE 1 CAPSULE BY MOUTH THREE TIMES A DAY IF NEEDED  FOR COUGH  . cholestyramine (QUESTRAN) 4 g packet take 1 packet once daily if needed for diarrhea  . diclofenac sodium (VOLTAREN) 1 % GEL Apply 2 g topically 3 (three) times daily.  Marland Kitchen dicyclomine (BENTYL) 10 MG capsule take 1 capsule by mouth once daily (Patient taking differently: take 1 capsule by mouth daily as needed for stomach pain)  . fluticasone (FLOVENT HFA) 110 MCG/ACT inhaler Inhale 2 puffs into the lungs 2 (two) times daily.  Marland Kitchen glucose blood (ACCU-CHEK AVIVA PLUS) test strip Use as instructed to check blood sugars 8 times per  day dx code 250.02  . Insulin Syringe-Needle U-100 (BD INSULIN SYRINGE ULTRAFINE) 31G X 15/64" 0.5 ML MISC Use 2 per day dx code E11.65  . irbesartan (AVAPRO) 75 MG tablet Take 1 tablet (75 mg total) by mouth daily.  . Lancets (ACCU-CHEK MULTICLIX) lancets Use to check blood sugar 8 times per day. Dx code E11.9  . meclizine (ANTIVERT) 25 MG tablet take 1 tablet by mouth three times a day if needed for dizziness (Patient taking differently: take 1 tablet by mouth three times a day if needed for dizziness/vertigo)  . metoprolol succinate (TOPROL-XL) 25 MG 24 hr tablet Take 1 tablet (25 mg total) by mouth daily.  Marland Kitchen NOVOLOG FLEXPEN 100 UNIT/ML FlexPen inject 18 units subcutaneously daily WITH BREAKFAST 22 units WITH LUNCH AND 24 UNITS AT SUPPER  . omeprazole (PRILOSEC) 20 MG capsule Take 1 capsule (20 mg total) by mouth daily.  . OXYGEN Inhale 3 L into the lungs continuous.   . pravastatin (PRAVACHOL) 20 MG tablet Take 1 tablet (20 mg total) by mouth daily.  . promethazine (PHENERGAN) 25 MG tablet Take 1 tablet (25 mg total) by mouth every 6 (six) hours as needed for nausea or vomiting.  . rifaximin (XIFAXAN) 550 MG TABS tablet Take 1 tablet (550 mg total) by mouth 3 (three) times daily for 14 days.  . sodium chloride HYPERTONIC 3 % nebulizer solution 1 ampule neb every 6 hours if needed to clear mucus  . Spacer/Aero-Holding Chambers (AEROCHAMBER MV) inhaler Use as instructed  . STIOLTO RESPIMAT 2.5-2.5 MCG/ACT AERS inhale 2 puffs once daily  . TRESIBA FLEXTOUCH 200 UNIT/ML SOPN inject 94 units subcutaneously once daily  . XOPENEX HFA 45 MCG/ACT inhaler inhale 2 puffs by mouth every 6 hours if needed for wheezing or shortness of breath  . [DISCONTINUED] venlafaxine XR (EFFEXOR XR) 37.5 MG 24 hr capsule Take 1 capsule (37.5 mg total) by mouth daily with breakfast.   No facility-administered encounter medications on file as of 09/17/2017.    Allergies  Allergen Reactions  . Chlordiazepoxide-Clidinium  Other (See Comments)    Sleepy,weak  . Biaxin [Clarithromycin] Other (See Comments)    Foul taste, abd pain, diarrhea  . Tramadol Other (See Comments)    Caused tremors  . Codeine Other (See Comments)    REACTION: gi upset- only in high doses per pt.  Can tolerate cough syrup with codeine.    Patient Active Problem List   Diagnosis Date Noted  . Diarrhea 11/08/2016  . Thrombocytopenia ([place]) 11/08/2016  . Degenerative lumbar spinal stenosis 10/27/2015  . Arthralgia 09/22/2015  . Chronic respiratory failure with hypoxia ([place]) 01/11/2015  . Diabetic polyneuropathy associated with type 2 diabetes mellitus ([place]) 11/16/2014  . Essential hypertension, benign 05/19/2013  . Vertigo 05/11/2013  . Dizziness 05/11/2013  . Tachycardia 05/11/2013  . Lower urinary tract infectious disease 05/11/2013  . Sinusitis 05/11/2013  . Other and unspecified hyperlipidemia 02/15/2013  .  Type II diabetes mellitus, uncontrolled ([place]) 01/01/2013  . Benign paroxysmal positional vertigo 08/09/2012  . Type II or unspecified type diabetes mellitus with neurological manifestations, uncontrolled(250.62) 08/09/2012  . GLAUCOMA 03/16/2010  . HOARSENESS 03/16/2010  . COUGH 12/23/2009  . CHEST PAIN 12/23/2009  . MUSCULOSKELETAL PAIN 01/28/2009  . COPD mixed type ([place]) 01/15/2009  . Seasonal and perennial allergic rhinitis 11/23/2007  . CHRONIC RHINITIS 07/16/2007  . G E R D 05/29/2007  . I B S-DIARRHEAL PREDOMINATE 05/29/2007  . FATTY LIVER DISEASE 05/29/2007   Social History   Socioeconomic History  . Marital status: Single    Spouse name: Not on file  . Number of children: 0  . Years of education: Not on file  . Highest education level: Not on file  Occupational History  . Occupation: Retired   Scientific laboratory technician  . Financial resource strain: Not on file  . Food insecurity:    Worry: Not on file    Inability: Not on file  . Transportation needs:    Medical: Not on file    Non-medical: Not on file    Tobacco Use  . Smoking status: Former Smoker    Types: Cigarettes    Last attempt to quit: 05/08/1988    Years since quitting: 29.3  . Smokeless tobacco: Never Used  Substance and Sexual Activity  . Alcohol use: No    Alcohol/week: 0.0 oz  . Drug use: No  . Sexual activity: Never  Lifestyle  . Physical activity:    Days per week: Not on file    Minutes per session: Not on file  . Stress: Not on file  Relationships  . Social connections:    Talks on phone: Not on file    Gets together: Not on file    Attends religious service: Not on file    Active member of club or organization: Not on file    Attends meetings of clubs or organizations: Not on file    Relationship status: Not on file  . Intimate partner violence:    Fear of current or ex partner: Not on file    Emotionally abused: Not on file    Physically abused: Not on file    Forced sexual activity: Not on file  Other Topics Concern  . Not on file  Social History Narrative  . Not on file    [name]'s family history includes Diabetes in her unknown relative; Heart disease in her father; Liver cancer in her sister; Lung cancer in her mother and sister; Stroke in her maternal grandfather.      Objective:    Vitals:   09/17/17 1015  BP: 134/70  Pulse: 100    Physical Exam; well-developed elderly white female in no acute distress, pleasant blood pressure 134/70 pulse 100, height 5 foot 1, weight 188, BMI 35.5.  HEENT; nontraumatic normocephalic EOMI PERRLA sclera anicteric, Oropharynx benign, Cardiovascular; regular rate and rhythm with S1-S2, Pulmonary; decreased breath sounds bilaterally, patient with portable oxygen at 2.5 L.  Abdomen soft, nondistended nontender no palpable mass or hepatosplenomegaly bowel sounds are active, Rectal; exam not done, Extremities ;no clubbing cyanosis or edema skin warm and dry, Neuro psych;, alert and oriented, grossly nonfocal mood and affect appropriate ,patient talkative        Assessment & Plan:   #78 82 year old white female with long-standing chronic diarrhea which had been somewhat episodic in the past and now more chronic over the past several months with occasional episodes of incontinence.  By history she continues to still have some normal stools. She has had GI evaluation by [facility] and also by Dr. Earlean Shawl within the past year as outlined above  I suspect she has IBS D and may have component of bacterial overgrowth.  Also may have a component of diabetic visceral neuropathy  #2 COPD/O2 dependent #3 hypertension #4 GERD  Plan; patient has signed a release and we will obtain copies of her records from Dr. Alessandra Bevels. Start Imodium 1 p.o. every morning every day We will give her a course of Xifaxan 550 mg p.o. 3 times daily x14 days Will reassess response to Imodium and Xifaxan, plan to follow-up in the office with me in 1 month. Long discussion with patient today regarding her  Chronic GI issues and chronic diarrhea.  Greater than 50% of visit spent in counseling and coordination of care. Patient will be established with Dr. Fuller Plan  [name] Elmendorf Genia Harold PA-C 09/17/2017   Cc: [name], MD

## 2017-09-18 DIAGNOSIS — M5136 Other intervertebral disc degeneration, lumbar region: Secondary | ICD-10-CM | POA: Diagnosis not present

## 2017-09-18 NOTE — Progress Notes (Signed)
Reviewed and agree with initial management plan.  Tally Mckinnon T. Hanford Lust, MD FACG 

## 2017-09-19 ENCOUNTER — Telehealth: Payer: Self-pay | Admitting: Endocrinology

## 2017-09-19 ENCOUNTER — Telehealth: Payer: Self-pay | Admitting: Physician Assistant

## 2017-09-19 NOTE — Telephone Encounter (Signed)
Please advise. Blood sugar was 276 this morning fasting. Last night it was 310 before dinner.

## 2017-09-19 NOTE — Telephone Encounter (Signed)
Confirmed she is to take 1 Imodium every morning and Xifaxan 3 times daily for 14 days.

## 2017-09-19 NOTE — Telephone Encounter (Signed)
Patient stated she was asked to call back.  She has started her injections. And wanted to let her dr know what her sugars have been running She stated yesterday morning her sugars were close to 400 and dropped back down to 278.  Please advise

## 2017-09-19 NOTE — Telephone Encounter (Signed)
Patient called and given MD message. Patient verbalized understanding.

## 2017-09-19 NOTE — Telephone Encounter (Signed)
If she has not increased her insulin she needs to add additional 20 units of Tresiba daily and also take at least 10 to 15 units Humalog more up to 4 times a day

## 2017-09-24 ENCOUNTER — Telehealth: Payer: Self-pay | Admitting: Internal Medicine

## 2017-09-24 MED ORDER — TIOTROPIUM BROMIDE-OLODATEROL 2.5-2.5 MCG/ACT IN AERS: 2.0000 | INHALATION_SPRAY | Freq: Every day | RESPIRATORY_TRACT | 11 refills | Status: DC

## 2017-09-24 NOTE — Telephone Encounter (Signed)
Spoke with pt and advised rx sent to pharmacy. Nothing further is needed.   

## 2017-09-27 ENCOUNTER — Telehealth: Payer: Self-pay | Admitting: Internal Medicine

## 2017-09-27 NOTE — Telephone Encounter (Signed)
Spoke with pt. She is aware that we have started the PA on Stiolto. Pt is requesting a sample to last her until we get this approved. Sample has been left at the front desk. Will await the PA decision.

## 2017-09-27 NOTE — Telephone Encounter (Signed)
[place] and spoke with Margarita Grizzle. Stiolto is needing a PA. I have called [facility] [phone]). PA could not be completed over the phone and has been started on Cover My Meds. Key: [id number].  Attempted to contact pt. I did not receive an answer. Line rang busy x2. Will try back.

## 2017-09-27 NOTE — Telephone Encounter (Signed)
Pt is returning call. Cb is [phone].

## 2017-10-04 DIAGNOSIS — R300 Dysuria: Secondary | ICD-10-CM | POA: Diagnosis not present

## 2017-10-10 ENCOUNTER — Ambulatory Visit (INDEPENDENT_AMBULATORY_CARE_PROVIDER_SITE_OTHER): Payer: Medicare Other | Admitting: Orthopaedic Surgery

## 2017-10-11 DIAGNOSIS — M19042 Primary osteoarthritis, left hand: Secondary | ICD-10-CM | POA: Diagnosis not present

## 2017-10-11 DIAGNOSIS — M19041 Primary osteoarthritis, right hand: Secondary | ICD-10-CM | POA: Diagnosis not present

## 2017-10-11 DIAGNOSIS — M19049 Primary osteoarthritis, unspecified hand: Secondary | ICD-10-CM | POA: Insufficient documentation

## 2017-10-11 DIAGNOSIS — Z9981 Dependence on supplemental oxygen: Secondary | ICD-10-CM | POA: Diagnosis not present

## 2017-10-17 ENCOUNTER — Encounter: Payer: Self-pay | Admitting: Physician Assistant

## 2017-10-17 ENCOUNTER — Ambulatory Visit (INDEPENDENT_AMBULATORY_CARE_PROVIDER_SITE_OTHER): Payer: Medicare Other | Admitting: Physician Assistant

## 2017-10-17 VITALS — BP 130/80 | HR 78 | Ht 63.0 in | Wt 188.8 lb

## 2017-10-17 DIAGNOSIS — K580 Irritable bowel syndrome with diarrhea: Secondary | ICD-10-CM

## 2017-10-17 MED ORDER — DICYCLOMINE HCL 10 MG PO CAPS: 10.0000 mg | ORAL_CAPSULE | Freq: Two times a day (BID) | ORAL | 11 refills | Status: DC

## 2017-10-17 NOTE — Progress Notes (Signed)
Subjective:    Patient ID: [name], female    DOB: 05-27-1935, 82 y.o.   MRN: [id number]  HPI [name] is a pleasant 82 year old white female, who comes in today for follow-up.  She was seen as a new patient on 09/17/2017 by myself and established with [name].  She has chronic diarrhea, hypertension, COPD with chronic respiratory failure requiring chronic O2, GERD, history of IBS and adult onset diabetes mellitus with neuropathy. Patient has had long-standing issues with diarrhea and had been evaluated remotely by Dr. Delfin [name].  She had colonoscopy in 2012 for diarrhea which was normal including random biopsies for microscopic colitis which were negative.  She also had EGD at that time showing a large hiatal hernia, small bowel biopsies were negative. More recently she had hospitalization in summer 2018 with acute exacerbation of diarrhea.  She was seen by Dr. Alessandra [name] at that time, GI pathogen panel was negative and she was given a trial of Colestid.  Colonoscopy had been discussed but was initially declined by the patient.  She did have that done outpatient after that admission and this was negative.  She was then evaluated by Dr. Earlean [name] last fall and had stool for lactoferrin negative, fecal elastase negative, fecal calprotectin negative and celiac markers were negative.  He advised to start taking a VS L3 trial.  Apparently there was a disagreement between the 2 of them and she did not return to his care. After initial evaluation here she was given a course of Xifaxan 550 mg p.o. 3 times daily x14 days for possible small intestinal bacterial overgrowth/IBS D.  She has completed that course and says she really did not have any significant improvement with the Xifaxan.  She is also taking Imodium 1P0 every morning.  She has a prescription for Bentyl but has not been using this regularly. She says on a bad day she may have 3-5 bowel movements in a day, and on a good day 1-2.  She continues  to have loose stools.  She also has recently been treated for cystitis with a short course of antibiotics. She describes episodes that start with abdominal discomfort cramping and then hard stool followed by very loose stools. Appetite has been good, weight has been stable. Review of Systems Pertinent positive and negative review of systems were noted in the above HPI section.  All other review of systems was otherwise negative.  Outpatient Encounter Medications as of 10/17/2017  Medication Sig  . acetaminophen (TYLENOL) 500 MG tablet Take 1,000 mg by mouth every 6 (six) hours as needed for headache (pain).   Marland Kitchen ALPRAZolam (XANAX) 0.5 MG tablet Half-1 tablet twice a day as needed for anxiety  . aspirin 325 MG tablet Take 325 mg by mouth daily.    . B-D UF III MINI PEN NEEDLES 31G X 5 MM MISC USE 5 PER DAY TO INJECT INSULIN  . B-D ULTRAFINE III SHORT PEN 31G X 8 MM MISC TEST 3 TIMES A DAY  . benzonatate (TESSALON) 200 MG capsule TAKE 1 CAPSULE BY MOUTH THREE TIMES A DAY IF NEEDED FOR COUGH  . cholestyramine (QUESTRAN) 4 g packet take 1 packet once daily if needed for diarrhea  . dicyclomine (BENTYL) 10 MG capsule take 1 capsule by mouth once daily (Patient taking differently: take 1 capsule by mouth daily as needed for stomach pain)  . fluticasone (FLOVENT HFA) 110 MCG/ACT inhaler Inhale 2 puffs into the lungs 2 (two) times daily.  Marland Kitchen glucose blood (  ACCU-CHEK AVIVA PLUS) test strip Use as instructed to check blood sugars 8 times per day dx code 250.02  . Insulin Syringe-Needle U-100 (BD INSULIN SYRINGE ULTRAFINE) 31G X 15/64" 0.5 ML MISC Use 2 per day dx code E11.65  . irbesartan (AVAPRO) 75 MG tablet Take 1 tablet (75 mg total) by mouth daily.  . Lancets (ACCU-CHEK MULTICLIX) lancets Use to check blood sugar 8 times per day. Dx code E11.9  . magic mouthwash SOLN Swish with 2 teaspoonfuls by mouth as needed (Patient taking differently: Take by mouth See admin instructions. Swish and spit small amount  two times daily as needed for mouth sore)  . meclizine (ANTIVERT) 25 MG tablet take 1 tablet by mouth three times a day if needed for dizziness (Patient taking differently: take 1 tablet by mouth three times a day if needed for dizziness/vertigo)  . metoprolol succinate (TOPROL-XL) 25 MG 24 hr tablet Take 1 tablet (25 mg total) by mouth daily.  Marland Kitchen NOVOLOG FLEXPEN 100 UNIT/ML FlexPen inject 18 units subcutaneously daily WITH BREAKFAST 22 units WITH LUNCH AND 24 UNITS AT SUPPER  . omeprazole (PRILOSEC) 20 MG capsule Take 1 capsule (20 mg total) by mouth daily.  . OXYGEN Inhale 3 L into the lungs continuous.   . pravastatin (PRAVACHOL) 20 MG tablet Take 1 tablet (20 mg total) by mouth daily.  . promethazine (PHENERGAN) 25 MG tablet Take 1 tablet (25 mg total) by mouth every 6 (six) hours as needed for nausea or vomiting.  . sodium chloride HYPERTONIC 3 % nebulizer solution 1 ampule neb every 6 hours if needed to clear mucus  . Spacer/Aero-Holding Chambers (AEROCHAMBER MV) inhaler Use as instructed  . Tiotropium Bromide-Olodaterol (STIOLTO RESPIMAT) 2.5-2.5 MCG/ACT AERS Inhale 2 puffs into the lungs daily.  . TRESIBA FLEXTOUCH 200 UNIT/ML SOPN inject 94 units subcutaneously once daily  . XOPENEX HFA 45 MCG/ACT inhaler inhale 2 puffs by mouth every 6 hours if needed for wheezing or shortness of breath  . diclofenac sodium (VOLTAREN) 1 % GEL Apply 2 g topically 3 (three) times daily.  Marland Kitchen dicyclomine (BENTYL) 10 MG capsule Take 1 capsule (10 mg total) by mouth 2 (two) times daily.   No facility-administered encounter medications on file as of 10/17/2017.    Allergies  Allergen Reactions  . Chlordiazepoxide-Clidinium Other (See Comments)    Sleepy,weak  . Biaxin [Clarithromycin] Other (See Comments)    Foul taste, abd pain, diarrhea  . Tramadol Other (See Comments)    Caused tremors  . Codeine Other (See Comments)    REACTION: gi upset- only in high doses per pt.  Can tolerate cough syrup with  codeine.    Patient Active Problem List   Diagnosis Date Noted  . Diarrhea 11/08/2016  . Thrombocytopenia ([place]) 11/08/2016  . Degenerative lumbar spinal stenosis 10/27/2015  . Arthralgia 09/22/2015  . Chronic respiratory failure with hypoxia ([place]) 01/11/2015  . Diabetic polyneuropathy associated with type 2 diabetes mellitus ([place]) 11/16/2014  . Essential hypertension, benign 05/19/2013  . Vertigo 05/11/2013  . Dizziness 05/11/2013  . Tachycardia 05/11/2013  . Lower urinary tract infectious disease 05/11/2013  . Sinusitis 05/11/2013  . Other and unspecified hyperlipidemia 02/15/2013  . Type II diabetes mellitus, uncontrolled ([place]) 01/01/2013  . Benign paroxysmal positional vertigo 08/09/2012  . Type II or unspecified type diabetes mellitus with neurological manifestations, uncontrolled(250.62) 08/09/2012  . GLAUCOMA 03/16/2010  . HOARSENESS 03/16/2010  . COUGH 12/23/2009  . CHEST PAIN 12/23/2009  . MUSCULOSKELETAL PAIN 01/28/2009  . COPD mixed  type ([place]) 01/15/2009  . Seasonal and perennial allergic rhinitis 11/23/2007  . CHRONIC RHINITIS 07/16/2007  . G E R D 05/29/2007  . I B S-DIARRHEAL PREDOMINATE 05/29/2007  . FATTY LIVER DISEASE 05/29/2007   Social History   Socioeconomic History  . Marital status: Single    Spouse name: Not on file  . Number of children: 0  . Years of education: Not on file  . Highest education level: Not on file  Occupational History  . Occupation: Retired   Scientific laboratory technician  . Financial resource strain: Not on file  . Food insecurity:    Worry: Not on file    Inability: Not on file  . Transportation needs:    Medical: Not on file    Non-medical: Not on file  Tobacco Use  . Smoking status: Former Smoker    Types: Cigarettes    Last attempt to quit: 05/08/1988    Years since quitting: 29.4  . Smokeless tobacco: Never Used  Substance and Sexual Activity  . Alcohol use: No    Alcohol/week: 0.0 oz  . Drug use: No  . Sexual activity: Never    Lifestyle  . Physical activity:    Days per week: Not on file    Minutes per session: Not on file  . Stress: Not on file  Relationships  . Social connections:    Talks on phone: Not on file    Gets together: Not on file    Attends religious service: Not on file    Active member of club or organization: Not on file    Attends meetings of clubs or organizations: Not on file    Relationship status: Not on file  . Intimate partner violence:    Fear of current or ex partner: Not on file    Emotionally abused: Not on file    Physically abused: Not on file    Forced sexual activity: Not on file  Other Topics Concern  . Not on file  Social History Narrative  . Not on file    Ms. Rudnick's family history includes Diabetes in her unknown relative; Heart disease in her father; Liver cancer in her sister; Lung cancer in her mother and sister; Stroke in her maternal grandfather.      Objective:    Vitals:   10/17/17 1045  BP: 130/80  Pulse: 78    Physical Exam; well-developed elderly white female in no acute distress, pleasant, she is on nasal oxygen.  Blood pressure 130/80 pulse 78, height 5 foot 3, weight 188, BMI 33.4.  HEENT ;nontraumatic normocephalic EOMI PERRLA sclera anicteric oropharynx clear, Cardiovascular; regular rate and rhythm with S1-S2 no murmur rub or gallop, Pulmonary ;clear bilaterally with decreased breath sounds, Abdomen ;obese, soft no focal tenderness no gu;arding or rebound no palpable mass or hepatosplenomegaly, bowel sounds are present, Rectal exam not done, Extremities; no clubbing cyanosis or edema skin warm and dry, Neuro psych; alert and oriented, grossly nonfocal mood and affect appropriate       Assessment & [name]:   #59 82 year old white female with long-standing chronic diarrhea with previous extensive work-up negative.  No response to recent course of Xifaxan for potential small bowel intestinal bacterial overgrowth. Diarrhea is consistent with IBS D,  and also may have component of visceral neuropathy from her diabetes.  #2 COPD/oxygen dependent #3.  Hypertension #4.  GERD #5.  Adult onset diabetes mellitus with neuropathy  [name]; Patient will continue Imodium 1 p.o. every morning She was advised  to take Bentyl 10 mg p.o. twice daily regularly rather than as needed She is advised to call if she has any worsening in symptoms over the next couple of months, otherwise we will be happy to follow her up on an as-needed basis.   Amy Genia Harold PA-C 10/17/2017   Cc: [name], MD

## 2017-10-17 NOTE — Patient Instructions (Signed)
Normal BMI (Body Mass Index- based on height and weight) is between 23 and 30. Your BMI today is Body mass index is 33.44 kg/m. [name] Please consider follow up  regarding your BMI with your Primary Care Provider.  We have sent the following prescriptions to your mail in pharmacy: Bentyl 29m  Twice daily  Please purchase the following medications over the counter and take as directed Imodium every morning:    If you have not heard from your mail in pharmacy within 1 week or if you have not received your medication in the mail, please contact [place] [phone] we may find out why.  Follow up with Amy or Dr stark as needed

## 2017-10-23 DIAGNOSIS — M5136 Other intervertebral disc degeneration, lumbar region: Secondary | ICD-10-CM | POA: Diagnosis not present

## 2017-10-23 DIAGNOSIS — M47812 Spondylosis without myelopathy or radiculopathy, cervical region: Secondary | ICD-10-CM | POA: Diagnosis not present

## 2017-10-24 ENCOUNTER — Other Ambulatory Visit: Payer: Self-pay | Admitting: Endocrinology

## 2017-10-25 ENCOUNTER — Other Ambulatory Visit: Payer: Self-pay

## 2017-10-25 ENCOUNTER — Encounter (INDEPENDENT_AMBULATORY_CARE_PROVIDER_SITE_OTHER): Payer: Self-pay | Admitting: Orthopedic Surgery

## 2017-10-25 ENCOUNTER — Ambulatory Visit (INDEPENDENT_AMBULATORY_CARE_PROVIDER_SITE_OTHER): Payer: Medicare Other | Admitting: Orthopedic Surgery

## 2017-10-25 ENCOUNTER — Ambulatory Visit (INDEPENDENT_AMBULATORY_CARE_PROVIDER_SITE_OTHER): Payer: Medicare Other

## 2017-10-25 ENCOUNTER — Telehealth: Payer: Self-pay | Admitting: Endocrinology

## 2017-10-25 VITALS — Ht 63.0 in | Wt 188.0 lb

## 2017-10-25 DIAGNOSIS — M25512 Pain in left shoulder: Secondary | ICD-10-CM

## 2017-10-25 DIAGNOSIS — M75122 Complete rotator cuff tear or rupture of left shoulder, not specified as traumatic: Secondary | ICD-10-CM

## 2017-10-25 MED ORDER — GLUCOSE BLOOD VI STRP: ORAL_STRIP | 3 refills | Status: DC

## 2017-10-25 MED ORDER — LIDOCAINE HCL 1 % IJ SOLN
5.0000 mL | INTRAMUSCULAR | Status: AC | PRN
  Administered 2017-10-25: 5 mL

## 2017-10-25 MED ORDER — METHYLPREDNISOLONE ACETATE 40 MG/ML IJ SUSP
40.0000 mg | INTRAMUSCULAR | Status: AC | PRN
  Administered 2017-10-25: 40 mg via INTRA_ARTICULAR

## 2017-10-25 NOTE — Telephone Encounter (Signed)
Chris from [employer] need a new Prescription and dx code for patient test strips Accu Check Aviva Plus test strips,  send to [facility] [phone]

## 2017-10-25 NOTE — Telephone Encounter (Signed)
New prescription sent to pharmacy 

## 2017-10-25 NOTE — Progress Notes (Signed)
Office Visit Note   Patient: [name]           Date of Birth: 05/31/1935           MRN: [id number] Visit Date: 10/25/2017              Requested by: [name], MD [facility] [address] [place], [place] [address] PCP: [name], MD  Chief Complaint  Patient presents with  . Left Shoulder - Pain      HPI: Patient is an 82 year old woman with multiple medical problems she has been having increasing pain with range of motion of her left shoulder.  She states that when reaching behind herself she heard a loud pop in her shoulder.  Assessment & Plan: Visit Diagnoses:  1. Acute pain of left shoulder   2. Nontraumatic complete tear of left rotator cuff     Plan: Left shoulder was injected without complications reevaluation in 4 weeks.  Follow-Up Instructions: Return in about 1 month (around 11/22/2017).   Ortho Exam  Patient is alert, oriented, no adenopathy, well-dressed, normal affect, normal respiratory effort. Examination patient is ambulating in a wheelchair.  She is on nasal cannula FiO2 3 L.  Examination she has active abduction and flexion of the left shoulder to 90 degrees.  She has pain with Neer and Hawkins impingement test pain with a drop arm test there is crepitation and popping in the subacromial space with range of motion consistent with a rotator cuff tear.  Imaging: Xr Shoulder Left  Result Date: 10/25/2017 2 view radiographs of the left shoulder shows no bony abnormality lung field is clear  No images are attached to the encounter.  Labs: Lab Results  Component Value Date   HGBA1C 9.1 09/05/2017   HGBA1C 9.0 05/07/2017   HGBA1C 8.1 12/20/2016   LABURIC 4.1 11/16/2014   REPTSTATUS 11/09/2016 FINAL 11/08/2016   CULT NO GROWTH 11/08/2016   LABORGA STAPHYLOCOCCUS SPECIES (COAGULASE NEGATIVE) 05/24/2015     Lab Results  Component Value Date   ALBUMIN 3.9 05/07/2017   ALBUMIN 4.3 12/20/2016   ALBUMIN 3.4 (L) 11/10/2016   LABURIC 4.1  11/16/2014    Body mass index is 33.3 kg/m.  Orders:  Orders Placed This Encounter  Procedures  . XR Shoulder Left   No orders of the defined types were placed in this encounter.    Procedures: Large Joint Inj: L subacromial bursa on 10/25/2017 1:51 PM Indications: diagnostic evaluation and pain Details: 22 G 1.5 in needle, posterior approach  Arthrogram: No  Medications: 5 mL lidocaine 1 %; 40 mg methylPREDNISolone acetate 40 MG/ML Outcome: tolerated well, no immediate complications Procedure, treatment alternatives, risks and benefits explained, specific risks discussed. Consent was given by the patient. Immediately prior to procedure a time out was called to verify the correct patient, procedure, equipment, support staff and site/side marked as required. Patient was prepped and draped in the usual sterile fashion.      Clinical Data: No additional findings.  ROS:  All other systems negative, except as noted in the HPI. Review of Systems  Objective: Vital Signs: Ht 5' 3"  (1.6 m)   Wt 188 lb (85.3 kg)   BMI 33.30 kg/m   Specialty Comments:  No specialty comments available.  PMFS History: Patient Active Problem List   Diagnosis Date Noted  . Diarrhea 11/08/2016  . Thrombocytopenia ([facility]) 11/08/2016  . Degenerative lumbar spinal stenosis 10/27/2015  . Arthralgia 09/22/2015  . Chronic respiratory failure with hypoxia (  [facility]) 01/11/2015  . Diabetic polyneuropathy associated with type 2 diabetes mellitus ([place]) 11/16/2014  . Essential hypertension, benign 05/19/2013  . Vertigo 05/11/2013  . Dizziness 05/11/2013  . Tachycardia 05/11/2013  . Lower urinary tract infectious disease 05/11/2013  . Sinusitis 05/11/2013  . Other and unspecified hyperlipidemia 02/15/2013  . Type II diabetes mellitus, uncontrolled ([place]) 01/01/2013  . Benign paroxysmal positional vertigo 08/09/2012  . Type II or unspecified type diabetes mellitus with neurological manifestations,  uncontrolled(250.62) 08/09/2012  . GLAUCOMA 03/16/2010  . HOARSENESS 03/16/2010  . COUGH 12/23/2009  . CHEST PAIN 12/23/2009  . MUSCULOSKELETAL PAIN 01/28/2009  . COPD mixed type ([facility]) 01/15/2009  . Seasonal and perennial allergic rhinitis 11/23/2007  . CHRONIC RHINITIS 07/16/2007  . G E R D 05/29/2007  . I B S-DIARRHEAL PREDOMINATE 05/29/2007  . FATTY LIVER DISEASE 05/29/2007   Past Medical History:  Diagnosis Date  . Allergic rhinitis, cause unspecified    Sinus CT Rec 12-23-2009  . Arthritis   . Benign paroxysmal positional vertigo 08/09/2012  . 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 Complication of anesthesia    takes along time to wake up  . Diabetes mellitus   . Diarrhea   . Diverticulosis   . Esophageal reflux   . Esophageal stricture   . GERD (gastroesophageal reflux disease)   . Glaucoma   . Hiatal hernia   . Hypertension   . Irritable bowel syndrome   . Neuropathy in diabetes ([facility]) 08/09/2012  . Other chronic nonalcoholic liver disease   . Oxygen deficiency   . Sciatica   . Scoliosis   . Shingles 2010  . Shortness of breath   . Steatohepatitis     Family History  Problem Relation Age of Onset  . Heart disease Father   . Lung cancer Mother        small cell;Byssinosis  . Lung cancer Sister   . Liver cancer Sister        ? mets from another area of the body  . Diabetes Unknown        grandmother  . Stroke Maternal Grandfather     Past Surgical History:  Procedure Laterality Date  . APPENDECTOMY    . CARPAL TUNNEL RELEASE     right hand  . CHOLECYSTECTOMY OPEN  1978  . COLONOSCOPY  07-2001   mild diverticulosis  . ESOPHAGOGASTRODUODENOSCOPY  8546,27-03   H Hernia,es.stricture s/p dil 78F  . INTRAOCULAR LENS INSERTION Bilateral   . LIVER BIOPSY  [phone]  . PARATHYROID EXPLORATION    . TONSILLECTOMY AND ADENOIDECTOMY    . TOTAL ABDOMINAL HYSTERECTOMY    . ULNAR NERVE TRANSPOSITION  12/07/2011    Procedure: ULNAR NERVE DECOMPRESSION/TRANSPOSITION;this was cancelled-not done  Surgeon: Cammie Sickle., MD;  Location: [facility];  Service: Orthopedics;  Laterality: Right;  right ulnar nerve in situ decompression  . ULNAR TUNNEL RELEASE  03/07/2012   Procedure: CUBITAL TUNNEL RELEASE;  Surgeon: Roseanne Kaufman, MD;  Location: [facility];  Service: Orthopedics;  Laterality: Right;  ulnar nerve release at the elbow     Social History   Occupational History  . Occupation: Retired   Tobacco Use  . Smoking status: Former Smoker    Types: Cigarettes    Last attempt to quit: 05/08/1988    Years since quitting: 29.4  . Smokeless tobacco: Never Used  Substance and Sexual Activity  . Alcohol use: No  Alcohol/week: 0.0 oz  . Drug use: No  . Sexual activity: Never

## 2017-10-26 ENCOUNTER — Other Ambulatory Visit: Payer: Self-pay

## 2017-10-26 MED ORDER — GLUCOSE BLOOD VI STRP: ORAL_STRIP | 3 refills | Status: DC

## 2017-10-26 NOTE — Telephone Encounter (Signed)
Pt states the test strips need to be called in ASAP to [facility] [phone]

## 2017-10-26 NOTE — Telephone Encounter (Signed)
Sent to correct pharmacy

## 2017-11-01 ENCOUNTER — Encounter (INDEPENDENT_AMBULATORY_CARE_PROVIDER_SITE_OTHER): Payer: Self-pay | Admitting: Orthopedic Surgery

## 2017-11-01 ENCOUNTER — Ambulatory Visit (INDEPENDENT_AMBULATORY_CARE_PROVIDER_SITE_OTHER): Payer: Medicare Other | Admitting: Orthopedic Surgery

## 2017-11-01 VITALS — Ht 63.0 in | Wt 188.0 lb

## 2017-11-01 DIAGNOSIS — S46212A Strain of muscle, fascia and tendon of other parts of biceps, left arm, initial encounter: Secondary | ICD-10-CM

## 2017-11-01 NOTE — Progress Notes (Signed)
Office Visit Note   Patient: [name]           Date of Birth: 1935-07-17           MRN: [id number] Visit Date: 11/01/2017              Requested by: [name], MD [facility] [address] [place], [place] [address] PCP: [name], MD  Chief Complaint  Patient presents with  . Left Upper Arm      HPI: Patient is a 82 year old woman who states that she was using her left arm recently had an acute onset of pain proximal below the shoulder.  She states she has developed bruising going down into the biceps and she presents at this time for initial evaluation.  Assessment & Plan: Visit Diagnoses:  1. Biceps rupture, proximal, left, initial encounter     Plan: Recommended conservative treatment this appears to be an acute rupture of the long head of the biceps tendon.  We will reevaluate at follow-up.  Follow-Up Instructions: Return in about 1 month (around 11/29/2017).   Ortho Exam  Patient is alert, oriented, no adenopathy, well-dressed, normal affect, normal respiratory effort. Examination patient uses nasal cannula FiO2 she has a cane in the right hand she does have an antalgic gait.  Examination patient is tender to palpation directly over the biceps tendon there is ecchymosis bruising distally she does have a Popeye deformity.  She has pain reproduced with elbow flexion.  Imaging: No results found. No images are attached to the encounter.  Labs: Lab Results  Component Value Date   HGBA1C 9.1 09/05/2017   HGBA1C 9.0 05/07/2017   HGBA1C 8.1 12/20/2016   LABURIC 4.1 11/16/2014   REPTSTATUS 11/09/2016 FINAL 11/08/2016   CULT NO GROWTH 11/08/2016   LABORGA STAPHYLOCOCCUS SPECIES (COAGULASE NEGATIVE) 05/24/2015     Lab Results  Component Value Date   ALBUMIN 3.9 05/07/2017   ALBUMIN 4.3 12/20/2016   ALBUMIN 3.4 (L) 11/10/2016   LABURIC 4.1 11/16/2014    Body mass index is 33.3 kg/m.  Orders:  No orders of the defined types were placed in this  encounter.  No orders of the defined types were placed in this encounter.    Procedures: No procedures performed  Clinical Data: No additional findings.  ROS:  All other systems negative, except as noted in the HPI. Review of Systems  Objective: Vital Signs: Ht 5' 3"  (1.6 m)   Wt 188 lb (85.3 kg)   BMI 33.30 kg/m   Specialty Comments:  No specialty comments available.  PMFS History: Patient Active Problem List   Diagnosis Date Noted  . Diarrhea 11/08/2016  . Thrombocytopenia ([facility]) 11/08/2016  . Degenerative lumbar spinal stenosis 10/27/2015  . Arthralgia 09/22/2015  . Chronic respiratory failure with hypoxia ([facility]) 01/11/2015  . Diabetic polyneuropathy associated with type 2 diabetes mellitus ([facility]) 11/16/2014  . Essential hypertension, benign 05/19/2013  . Vertigo 05/11/2013  . Dizziness 05/11/2013  . Tachycardia 05/11/2013  . Lower urinary tract infectious disease 05/11/2013  . Sinusitis 05/11/2013  . Other and unspecified hyperlipidemia 02/15/2013  . Type II diabetes mellitus, uncontrolled ([facility]) 01/01/2013  . Benign paroxysmal positional vertigo 08/09/2012  . Type II or unspecified type diabetes mellitus with neurological manifestations, uncontrolled(250.62) 08/09/2012  . GLAUCOMA 03/16/2010  . HOARSENESS 03/16/2010  . COUGH 12/23/2009  . CHEST PAIN 12/23/2009  . MUSCULOSKELETAL PAIN 01/28/2009  . COPD mixed type ([facility]) 01/15/2009  . Seasonal and perennial allergic rhinitis 11/23/2007  .  CHRONIC RHINITIS 07/16/2007  . G E R D 05/29/2007  . I B S-DIARRHEAL PREDOMINATE 05/29/2007  . FATTY LIVER DISEASE 05/29/2007   Past Medical History:  Diagnosis Date  . Allergic rhinitis, cause unspecified    Sinus CT Rec 12-23-2009  . Arthritis   . Benign paroxysmal positional vertigo 08/09/2012  . 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 Complication of anesthesia    takes along time to wake up  . Diabetes mellitus    . Diarrhea   . Diverticulosis   . Esophageal reflux   . Esophageal stricture   . GERD (gastroesophageal reflux disease)   . Glaucoma   . Hiatal hernia   . Hypertension   . Irritable bowel syndrome   . Neuropathy in diabetes ([facility]) 08/09/2012  . Other chronic nonalcoholic liver disease   . Oxygen deficiency   . Sciatica   . Scoliosis   . Shingles 2010  . Shortness of breath   . Steatohepatitis     Family History  Problem Relation Age of Onset  . Heart disease Father   . Lung cancer Mother        small cell;Byssinosis  . Lung cancer Sister   . Liver cancer Sister        ? mets from another area of the body  . Diabetes Unknown        grandmother  . Stroke Maternal Grandfather     Past Surgical History:  Procedure Laterality Date  . APPENDECTOMY    . CARPAL TUNNEL RELEASE     right hand  . CHOLECYSTECTOMY OPEN  1978  . COLONOSCOPY  07-2001   mild diverticulosis  . ESOPHAGOGASTRODUODENOSCOPY  2446,28-63   H Hernia,es.stricture s/p dil 69F  . INTRAOCULAR LENS INSERTION Bilateral   . LIVER BIOPSY  [phone]  . PARATHYROID EXPLORATION    . TONSILLECTOMY AND ADENOIDECTOMY    . TOTAL ABDOMINAL HYSTERECTOMY    . ULNAR NERVE TRANSPOSITION  12/07/2011   Procedure: ULNAR NERVE DECOMPRESSION/TRANSPOSITION;this was cancelled-not done  Surgeon: Cammie Sickle., MD;  Location: [facility];  Service: Orthopedics;  Laterality: Right;  right ulnar nerve in situ decompression  . ULNAR TUNNEL RELEASE  03/07/2012   Procedure: CUBITAL TUNNEL RELEASE;  Surgeon: Roseanne Kaufman, MD;  Location: [facility];  Service: Orthopedics;  Laterality: Right;  ulnar nerve release at the elbow     Social History   Occupational History  . Occupation: Retired   Tobacco Use  . Smoking status: Former Smoker    Types: Cigarettes    Last attempt to quit: 05/08/1988    Years since quitting: 29.5  . Smokeless tobacco: Never Used  Substance and Sexual Activity  . Alcohol use: No      Alcohol/week: 0.0 oz  . Drug use: No  . Sexual activity: Never

## 2017-11-02 ENCOUNTER — Telehealth: Payer: Self-pay | Admitting: Endocrinology

## 2017-11-02 NOTE — Telephone Encounter (Signed)
Patient is calling on the status of last message, she said her b/s were running high in the 300's abd she do not know what to do, could someone please call her.

## 2017-11-02 NOTE — Telephone Encounter (Signed)
Patient stated that she is having some concerns about her sugars. Yesterday am it was over 300. And she had not ate much food due to not being that hungry,. When she check her sugar this morning it was around 250 She would like some advise on what she should do.  She stated that she hurt her arm and her doctor gave her a cortisone shot.

## 2017-11-05 DIAGNOSIS — R300 Dysuria: Secondary | ICD-10-CM | POA: Diagnosis not present

## 2017-11-05 NOTE — Telephone Encounter (Signed)
Attempted to call pt X2 but line was busy.

## 2017-11-05 NOTE — Telephone Encounter (Signed)
Called pt and left voicemail to call office back.

## 2017-11-05 NOTE — Telephone Encounter (Signed)
Attempted to call pt and line was busy again.

## 2017-11-06 ENCOUNTER — Ambulatory Visit (INDEPENDENT_AMBULATORY_CARE_PROVIDER_SITE_OTHER): Payer: Medicare Other | Admitting: Endocrinology

## 2017-11-06 ENCOUNTER — Encounter: Payer: Self-pay | Admitting: Endocrinology

## 2017-11-06 ENCOUNTER — Other Ambulatory Visit: Payer: Self-pay

## 2017-11-06 VITALS — BP 142/72 | HR 91 | Ht 63.0 in | Wt 185.8 lb

## 2017-11-06 DIAGNOSIS — E1165 Type 2 diabetes mellitus with hyperglycemia: Secondary | ICD-10-CM | POA: Diagnosis not present

## 2017-11-06 DIAGNOSIS — I10 Essential (primary) hypertension: Secondary | ICD-10-CM

## 2017-11-06 DIAGNOSIS — E782 Mixed hyperlipidemia: Secondary | ICD-10-CM | POA: Diagnosis not present

## 2017-11-06 DIAGNOSIS — Z794 Long term (current) use of insulin: Secondary | ICD-10-CM | POA: Diagnosis not present

## 2017-11-06 DIAGNOSIS — K580 Irritable bowel syndrome with diarrhea: Secondary | ICD-10-CM | POA: Diagnosis not present

## 2017-11-06 LAB — COMPREHENSIVE METABOLIC PANEL
ALT: 17 U/L (ref 0–35)
AST: 21 U/L (ref 0–37)
Albumin: 4 g/dL (ref 3.5–5.2)
Alkaline Phosphatase: 47 U/L (ref 39–117)
BUN: 12 mg/dL (ref 6–23)
CALCIUM: 9.4 mg/dL (ref 8.4–10.5)
CHLORIDE: 100 meq/L (ref 96–112)
CO2: 31 meq/L (ref 19–32)
Creatinine, Ser: 0.62 mg/dL (ref 0.40–1.20)
GFR: 98.35 mL/min (ref >60.00)
Glucose, Bld: 84 mg/dL (ref 70–99)
POTASSIUM: 4 meq/L (ref 3.5–5.1)
Sodium: 138 mEq/L (ref 135–145)
Total Bilirubin: 0.3 mg/dL (ref 0.2–1.2)
Total Protein: 7.2 g/dL (ref 6.0–8.3)

## 2017-11-06 LAB — CBC WITH DIFFERENTIAL/PLATELET
BASOS PCT: 0.4 % (ref 0.0–3.0)
Basophils Absolute: 0 10*3/uL (ref 0.0–0.1)
Eosinophils Absolute: 0.4 10*3/uL (ref 0.0–0.7)
Eosinophils Relative: 3.2 % (ref 0.0–5.0)
HEMATOCRIT: 41.1 % (ref 36.0–46.0)
Hemoglobin: 12.8 g/dL (ref 12.0–15.0)
LYMPHS PCT: 37 % (ref 12.0–46.0)
Lymphs Abs: 4.2 10*3/uL — ABNORMAL HIGH (ref 0.7–4.0)
MCHC: 31.2 g/dL (ref 30.0–36.0)
MCV: 83.6 fl (ref 78.0–100.0)
MONOS PCT: 4.7 % (ref 3.0–12.0)
Monocytes Absolute: 0.5 10*3/uL (ref 0.1–1.0)
NEUTROS ABS: 6.1 10*3/uL (ref 1.4–7.7)
NEUTROS PCT: 54.7 % (ref 43.0–77.0)
PLATELETS: 181 10*3/uL (ref 150.0–400.0)
RBC: 4.92 Mil/uL (ref 3.87–5.11)
RDW: 16.4 % — AB (ref 11.5–15.5)
WBC: 11.2 10*3/uL — ABNORMAL HIGH (ref 4.0–10.5)

## 2017-11-06 LAB — LIPID PANEL
CHOLESTEROL: 148 mg/dL (ref 0–200)
HDL: 52.3 mg/dL (ref >39.00)
LDL CALC: 75 mg/dL (ref 0–99)
NonHDL: 95.88
TRIGLYCERIDES: 104 mg/dL (ref 0.0–149.0)
Total CHOL/HDL Ratio: 3
VLDL: 20.8 mg/dL (ref 0.0–40.0)

## 2017-11-06 MED ORDER — IRBESARTAN 75 MG PO TABS: 75.0000 mg | ORAL_TABLET | Freq: Every day | ORAL | 3 refills | Status: DC

## 2017-11-06 NOTE — Progress Notes (Signed)
Patient ID: [name], female   DOB: Sep 15, 1935, 82 y.o.   MRN: [id number]   Reason for Appointment: Diabetes and follow-up of multiple problems  History of Present Illness    PROBLEM 1:  DIABETES type II since 1980   She has been on insulin using Lantus and Humalog for several years and usually A1c around 8% despite fairly good compliance with diet, exercise and glucose monitoring. She also had been on Actos previously which probably benefited her especially with her fatty liver but she stopped it because of fear of side effects   She was changed from NovoLog to U-500 insulin starting in 02/2016; she did not related was controlling her sugars and preferred to go back to NovoLog.    Recent history:   Insulin regimen: Tresiba  60 units in a.m.  NOVOLOG before meals 20-24-30 units before meals  Her A1c has been previously consistently around 9- 10% and now 9.1   She has overall about the same blood sugar on an average but most of her high readings are in the late afternoon, before and after supper and overnight  FASTING and blood sugars before lunchtime are relatively good especially recently with average blood sugars around 155  Otherwise average blood sugars are over 200 before and after suppertime  She again says that she is eating relatively small portions but still requiring large amounts of mealtime insulin  In the last month has not had any steroid injections which were previously making her blood sugar go up  Has no significant hypoglycemia with only occasional low normal sugars fasting and lunchtime  She is quite consistent with taking her mealtime insulin with her meals  Has had gradual weight loss again with continued problems with decreased appetite and diarrhea   Oral hypoglycemic drugs: none       Side effects from medications: None         Proper timing of medications in relation to meals: Yes.          Monitors blood glucose:  About 3-4 times  a day   Glucometer:  Accu-Chek         Blood Glucose averages recently from meter download:  Mean values apply above for all meters except median for One Touch  PRE-MEAL Fasting Lunch Dinner  overnight Overall  Glucose range:  77-265  63-275    63-306  Mean/median:  155  153  225  198  181+/-54   POST-MEAL PC Breakfast PC Lunch PC Dinner  Glucose range:    192-306  Mean/median:    218   Previous readings:  Mean values apply above for all meters except median for One Touch  PRE-MEAL Fasting Lunch Dinner Bedtime Overall  Glucose range:  133-309  66-227  132-321  167-329   Mean/median:     190     Meals: 3 meals per day.  Breakfast 7 AM; lunch 1-2 pm Supper 6 to 7 PM     Physical activity: exercise: Unable to do any because of dyspnea and fatigue            Dietician visit: Most recent: Several years ago            Complications: are: Peripheral neuropathy with sensory loss     Wt Readings from Last 3 Encounters:  11/06/17 185 lb 12.8 oz (84.3 kg)  11/01/17 188 lb (85.3 kg)  10/25/17 188 lb (85.3 kg)    Lab Results  Component Value Date  HGBA1C 9.1 09/05/2017   HGBA1C 9.0 05/07/2017   HGBA1C 8.1 12/20/2016   Lab Results  Component Value Date   MICROALBUR 1.1 05/11/2017   LDLCALC 75 11/06/2017   CREATININE 0.62 11/06/2017     OTHER Current problems  are detailed in review of systems   Allergies as of 11/06/2017      Reactions   Chlordiazepoxide-clidinium Other (See Comments)   Sleepy,weak   Biaxin [clarithromycin] Other (See Comments)   Foul taste, abd pain, diarrhea   Tramadol Other (See Comments)   Caused tremors   Codeine Other (See Comments)   REACTION: gi upset- only in high doses per pt.  Can tolerate cough syrup with codeine.       Medication List        Accurate as of 11/06/17  8:56 PM. Always use your most recent med list.          accu-chek multiclix lancets Use to check blood sugar 8 times per day. Dx code E11.9   acetaminophen 500 MG  tablet Commonly known as:  TYLENOL Take 1,000 mg by mouth every 6 (six) hours as needed for headache (pain).   AEROCHAMBER MV inhaler Use as instructed   ALPRAZolam 0.5 MG tablet Commonly known as:  XANAX Half-1 tablet twice a day as needed for anxiety   aspirin 325 MG tablet Take 325 mg by mouth daily.   B-D UF III MINI PEN NEEDLES 31G X 5 MM Misc Generic drug:  Insulin Pen Needle USE 5 PER DAY TO INJECT INSULIN   B-D ULTRAFINE III SHORT PEN 31G X 8 MM Misc Generic drug:  Insulin Pen Needle TEST 3 TIMES A DAY   benzonatate 200 MG capsule Commonly known as:  TESSALON TAKE 1 CAPSULE BY MOUTH THREE TIMES A DAY IF NEEDED FOR COUGH   cholestyramine 4 g packet Commonly known as:  QUESTRAN take 1 packet once daily if needed for diarrhea   diclofenac sodium 1 % Gel Commonly known as:  VOLTAREN Apply 2 g topically 3 (three) times daily.   dicyclomine 10 MG capsule Commonly known as:  BENTYL Take 1 capsule (10 mg total) by mouth 2 (two) times daily.   fluticasone 110 MCG/ACT inhaler Commonly known as:  FLOVENT HFA Inhale 2 puffs into the lungs 2 (two) times daily.   glucose blood test strip Commonly known as:  ACCU-CHEK AVIVA PLUS Use as instructed to check blood sugars 8 times per day dx code E11.65   Insulin Syringe-Needle U-100 31G X 15/64" 0.5 ML Misc Commonly known as:  BD INSULIN SYRINGE ULTRAFINE Use 2 per day dx code E11.65   irbesartan 75 MG tablet Commonly known as:  AVAPRO Take 1 tablet (75 mg total) by mouth daily.   magic mouthwash Soln Swish with 2 teaspoonfuls by mouth as needed   meclizine 25 MG tablet Commonly known as:  ANTIVERT take 1 tablet by mouth three times a day if needed for dizziness   metoprolol succinate 25 MG 24 hr tablet Commonly known as:  TOPROL-XL Take 1 tablet (25 mg total) by mouth daily.   NOVOLOG FLEXPEN 100 UNIT/ML FlexPen Generic drug:  insulin aspart inject 18 units subcutaneously daily WITH BREAKFAST 22 units WITH  LUNCH AND 24 UNITS AT SUPPER   omeprazole 20 MG capsule Commonly known as:  PRILOSEC Take 1 capsule (20 mg total) by mouth daily.   OXYGEN Inhale 3 L into the lungs continuous.   pravastatin 20 MG tablet Commonly known as:  PRAVACHOL Take  1 tablet (20 mg total) by mouth daily.   promethazine 25 MG tablet Commonly known as:  PHENERGAN Take 1 tablet (25 mg total) by mouth every 6 (six) hours as needed for nausea or vomiting.   sodium chloride HYPERTONIC 3 % nebulizer solution 1 ampule neb every 6 hours if needed to clear mucus   Tiotropium Bromide-Olodaterol 2.5-2.5 MCG/ACT Aers Commonly known as:  STIOLTO RESPIMAT Inhale 2 puffs into the lungs daily.   TRESIBA FLEXTOUCH 200 UNIT/ML Sopn Generic drug:  Insulin Degludec INJECT 94 UNITS SUBCUTANEOUSLY ONCE DAILY.   XOPENEX HFA 45 MCG/ACT inhaler Generic drug:  levalbuterol inhale 2 puffs by mouth every 6 hours if needed for wheezing or shortness of breath       Allergies:  Allergies  Allergen Reactions  . Chlordiazepoxide-Clidinium Other (See Comments)    Sleepy,weak  . Biaxin [Clarithromycin] Other (See Comments)    Foul taste, abd pain, diarrhea  . Tramadol Other (See Comments)    Caused tremors  . Codeine Other (See Comments)    REACTION: gi upset- only in high doses per pt.  Can tolerate cough syrup with codeine.     Past Medical History:  Diagnosis Date  . Allergic rhinitis, cause unspecified    Sinus CT Rec 12-23-2009  . Arthritis   . Benign paroxysmal positional vertigo 08/09/2012  . 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 Complication of anesthesia    takes along time to wake up  . Diabetes mellitus   . Diarrhea   . Diverticulosis   . Esophageal reflux   . Esophageal stricture   . GERD (gastroesophageal reflux disease)   . Glaucoma   . Hiatal hernia   . Hypertension   . Irritable bowel syndrome   . Neuropathy in diabetes ([place]) 08/09/2012  .  Other chronic nonalcoholic liver disease   . Oxygen deficiency   . Sciatica   . Scoliosis   . Shingles 2010  . Shortness of breath   . Steatohepatitis     Past Surgical History:  Procedure Laterality Date  . APPENDECTOMY    . CARPAL TUNNEL RELEASE     right hand  . CHOLECYSTECTOMY OPEN  1978  . COLONOSCOPY  07-2001   mild diverticulosis  . ESOPHAGOGASTRODUODENOSCOPY  1610,96-04   H Hernia,es.stricture s/p dil 96F  . INTRAOCULAR LENS INSERTION Bilateral   . LIVER BIOPSY  [phone]  . PARATHYROID EXPLORATION    . TONSILLECTOMY AND ADENOIDECTOMY    . TOTAL ABDOMINAL HYSTERECTOMY    . ULNAR NERVE TRANSPOSITION  12/07/2011   Procedure: ULNAR NERVE DECOMPRESSION/TRANSPOSITION;this was cancelled-not done  Surgeon: Cammie Sickle., MD;  Location: [facility];  Service: Orthopedics;  Laterality: Right;  right ulnar nerve in situ decompression  . ULNAR TUNNEL RELEASE  03/07/2012   Procedure: CUBITAL TUNNEL RELEASE;  Surgeon: Roseanne Kaufman, MD;  Location: [facility];  Service: Orthopedics;  Laterality: Right;  ulnar nerve release at the elbow      Family History  Problem Relation Age of Onset  . Heart disease Father   . Lung cancer Mother        small cell;Byssinosis  . Lung cancer Sister   . Liver cancer Sister        ? mets from another area of the body  . Diabetes Unknown        grandmother  . Stroke Maternal Grandfather     Social History:  reports  that she quit smoking about 29 years ago. Her smoking use included cigarettes. She has never used smokeless tobacco. She reports that she does not drink alcohol or use drugs.  Review of Systems:  DIARRHEA: She was recommended taking Bentyl and Lomotil by her recent gastroenterology consultant This is however not helping She has not taken Colestid which was interventionally helping her diarrhea previously Currently the working diagnosis is still irritable bowel syndrome   HYPERTENSION:   blood  pressure is controlled now with adding 75 mg of irbesartan Previously only on metoprolol since her hospitalization for diarrhea    BP Readings from Last 3 Encounters:  11/06/17 (!) 142/72  10/17/17 130/80  09/17/17 134/70    She takes Xanax at night mostly and lorazepam as needed for anxiety  HYPERLIPIDEMIA:  She has had mixed hyperlipidemia, LDL treated with pravastatin 20 mg Needs follow-up     Lab Results  Component Value Date   CHOL 148 11/06/2017   HDL 52.30 11/06/2017   LDLCALC 75 11/06/2017   LDLDIRECT 89.0 10/14/2014   TRIG 104.0 11/06/2017   CHOLHDL 3 11/06/2017    She is followed by pulmonologist for chronic COPD, using home oxygen   She has had persistent joint pains and back pain Back pain not better with the epidural steroid  She also has had some issues with her left shoulder including recent tendon tear and resulting ecchymosis in the left arm followed by orthopedic surgeon     Examination:   BP (!) 142/72 (BP Location: Right Arm, Patient Position: Sitting, Cuff Size: Normal)   Pulse 91   Ht 5' 3"  (1.6 m)   Wt 185 lb 12.8 oz (84.3 kg)   SpO2 92%   BMI 32.91 kg/m   Body mass index is 32.91 kg/m.   No ankle edema present Repeat blood pressure about the same  ASSESSMENT/ PLAN:    Diabetes type 2 with obesity, insulin-dependent  See history of present illness for detailed discussion of  current management, blood sugar patterns and problems identified Her last A1c was 9.1  She is currently only on insulin and as discussed above seems to be needing more mealtime insulin other than basal insulin even though her caloric and carbohydrate intake is relatively small She also has some diarrhea, issues with recurrent UTIs, and her weight is less than before  Recommendations: Since she has low normal readings fasting and before lunch she can reduce her Tresiba by 4 units She will also increase her lunch and suppertime NovoLog by 4 units more than  what she is usually taking despite her decreased intake Discussed blood sugar targets of at least under 200 and preferably under 180 after meals and not under 90 fasting  DIARRHEA: Still having intermittent episodes, likely to be irritable bowel syndrome May also have  autonomic neuropathy especially with fecal incontinence  Recommend taking Colestid as needed again  She is not benefiting from current management  HYPERTENSION: Better with adding 75 mg of Avapro daily  NEUROPATHY: She is going to see her podiatrist for full exam soon and she did not want to do this today Reminded her to check her feet daily  Total visit time for evaluation and management of multiple problems and counseling =25 minutes   Patient Instructions  Take Colestid pills upto 4 daily  Novolog 24-28 at lunch and 34-38 at supper (4 more than before)  Tresiba 56 units     [place] 11/06/2017, 8:56 PM

## 2017-11-06 NOTE — Telephone Encounter (Signed)
PT was seen in the office on 11/06/2017 and Dr. Dwyane Dee addressed the pt's concerns.

## 2017-11-06 NOTE — Patient Instructions (Signed)
Take Colestid pills upto 4 daily  Novolog 24-28 at lunch and 34-38 at supper (4 more than before)  Tresiba 56 units

## 2017-11-06 NOTE — Progress Notes (Signed)
Please call to let patient know that the lab results are normal and no further action needed

## 2017-11-07 ENCOUNTER — Telehealth: Payer: Self-pay | Admitting: Physician Assistant

## 2017-11-07 NOTE — Telephone Encounter (Signed)
Patient reports she is having a "spell of diarrhea." Not a new problem but an ongoing problem. She states she taking all of her medication as discussed at her visit. She has taken Imodium and Bentyl already today. Unable to give me a number of the loose bowel movements today and states "don't count them." She is incontinent of her bowels which she states is also ongoing. She has an aide in the home to help her which she says she is glad she has. Afebrile. No bloody stools. Denies laxative use. Do you have any suggestions? She asked this message go to [employer], PA-C because "she knows me best." Understands there will be a delay in the response.

## 2017-11-07 NOTE — Telephone Encounter (Signed)
I believe she has only been taking one imodium each am, and should be taking Bentyl BID - if having a spell of diarrhea can increase Imodium to 3 times daily  for a few days , and stay on twice daily bentyl

## 2017-11-09 IMAGING — CR DG CHEST 2V
2 series · 2 of 2 positions shown · non-contrast
Comparison: 02/19/2014.

CLINICAL DATA: Shortness of breath.

EXAM:
CHEST  2 VIEW

[PA]
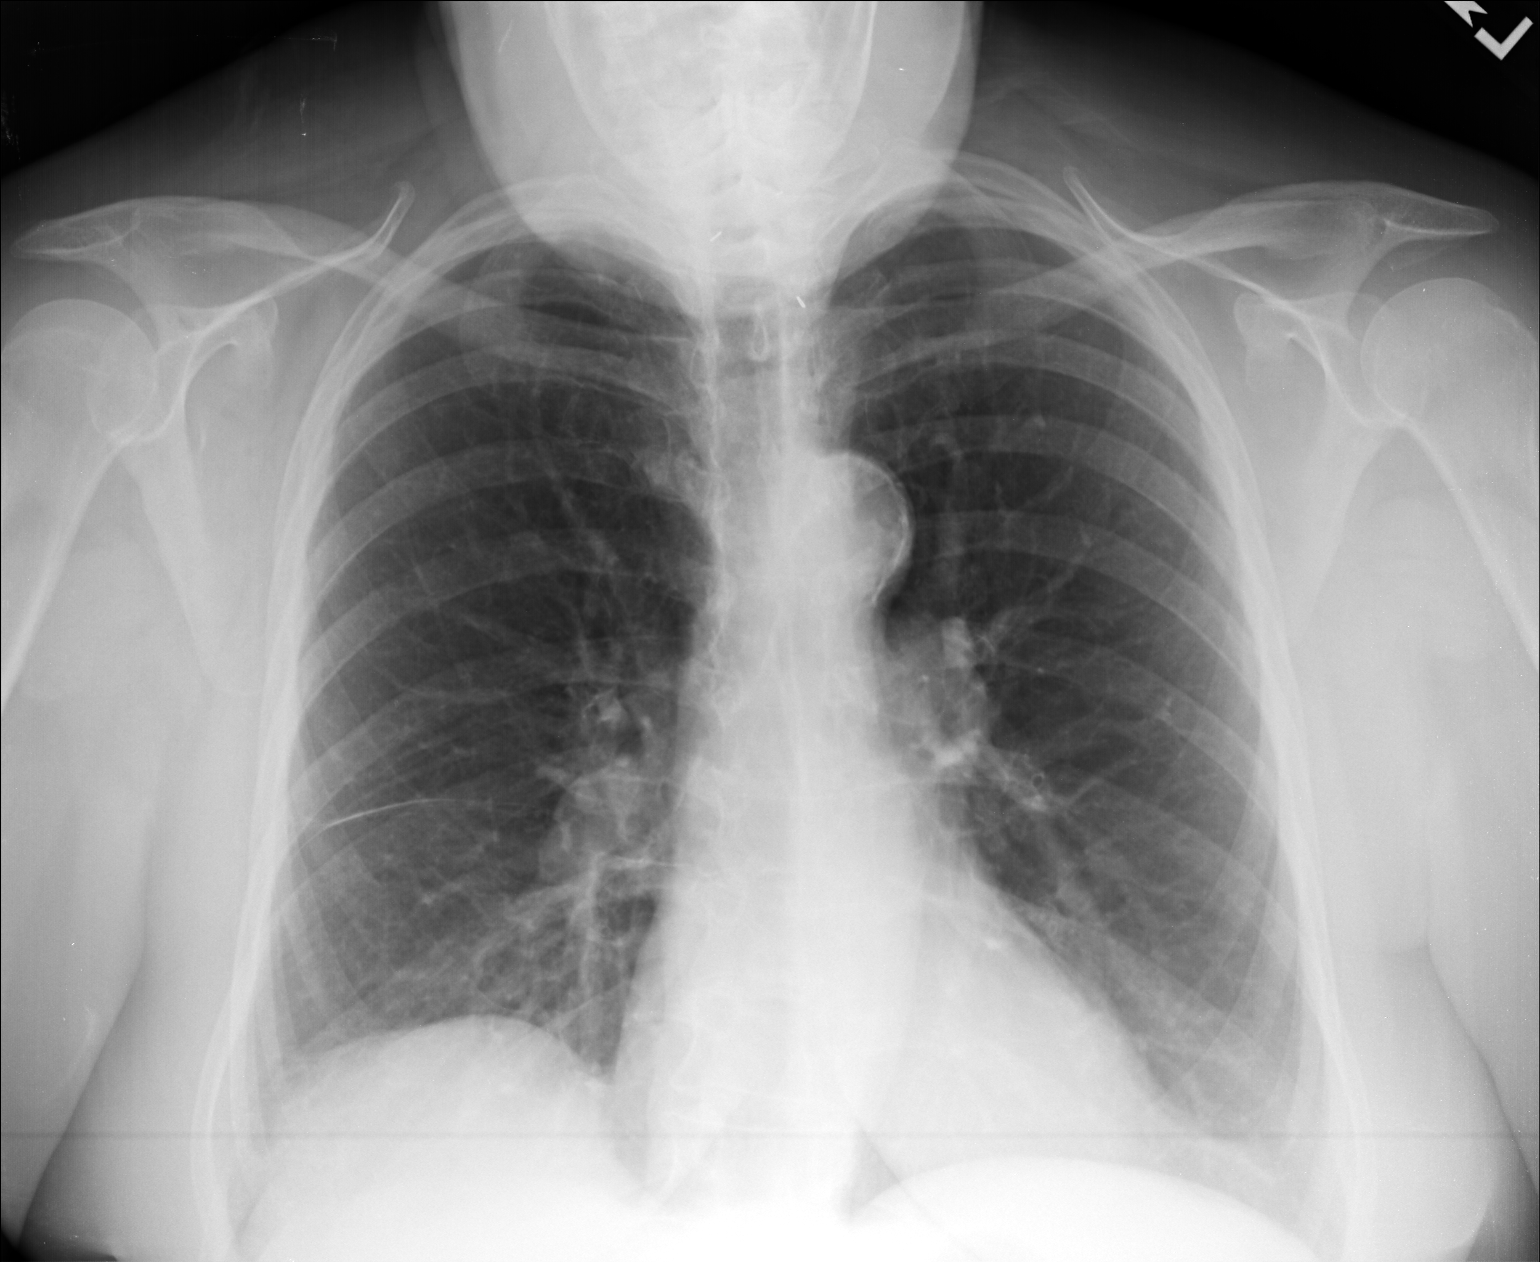

[lateral]
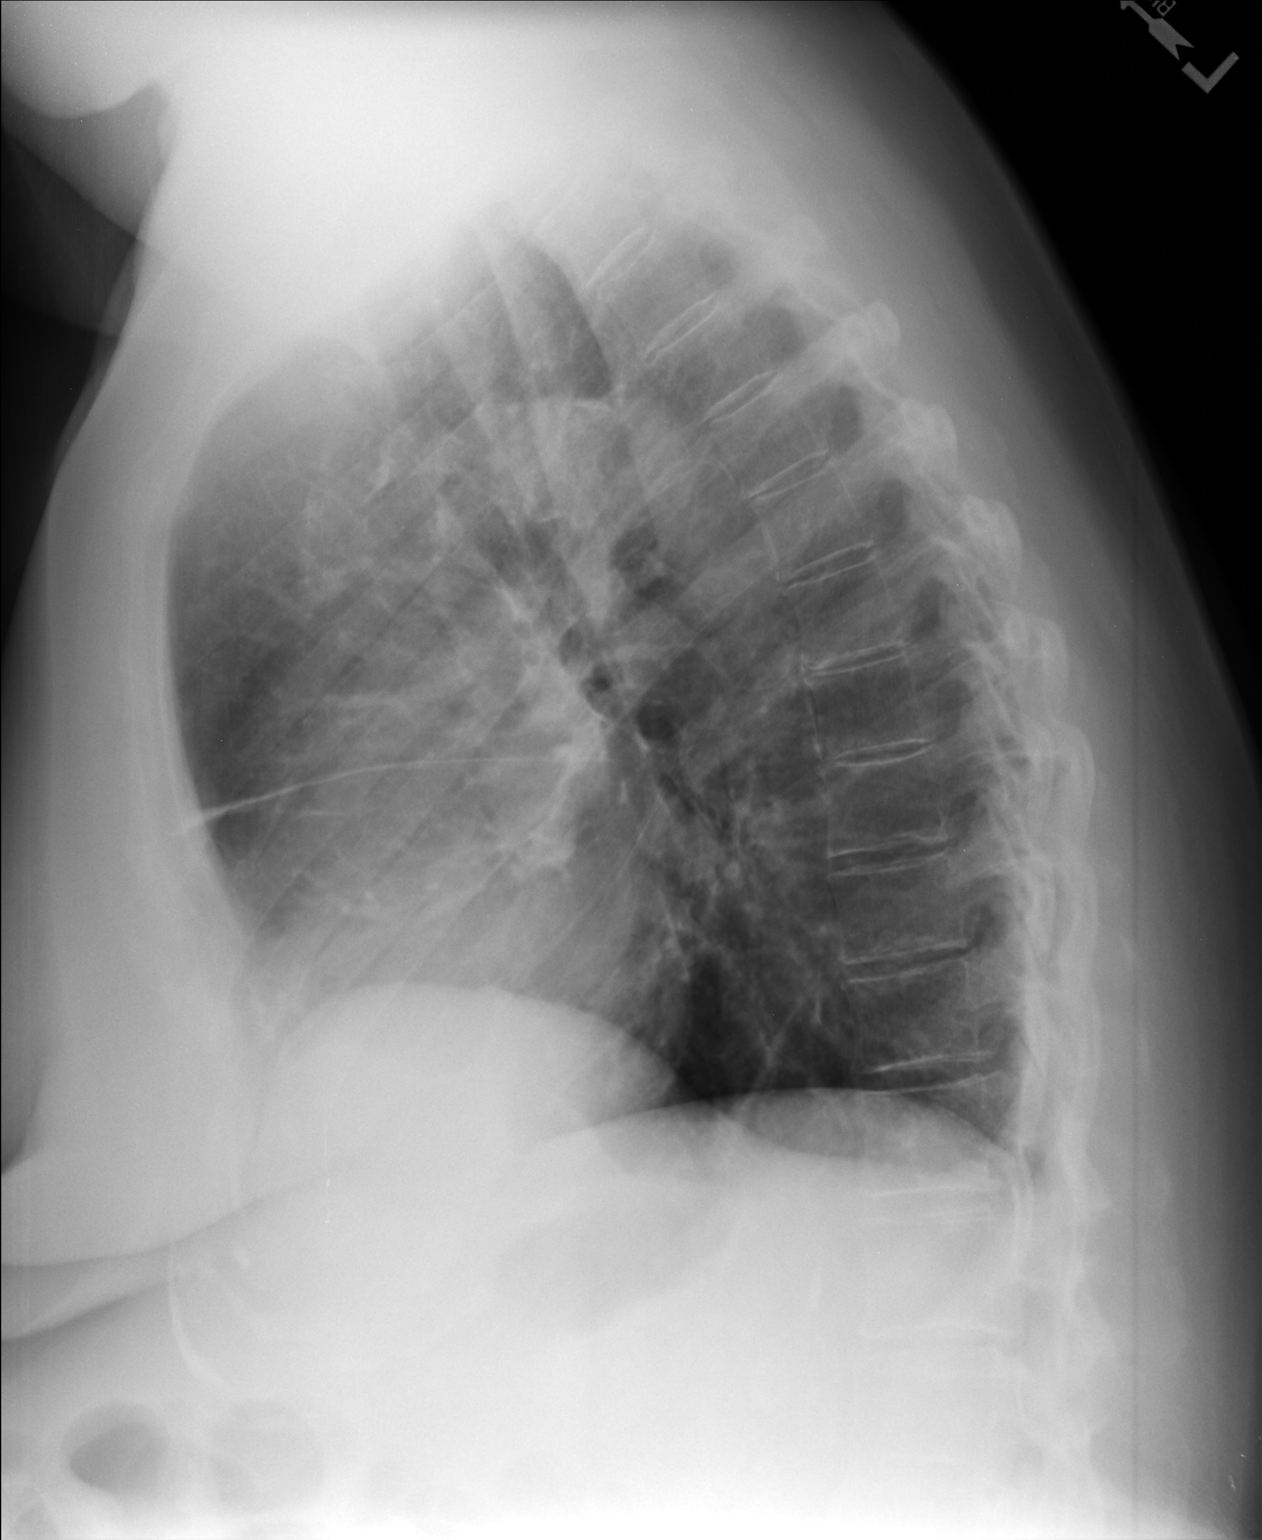

[2 of 2 positions shown; findings below may reference images not displayed]

FINDINGS: Mediastinum hilar structures normal. Heart size normal. Low lung
volumes with mild bibasilar atelectasis and/or infiltrate. No
pleural effusion or pneumothorax. No acute bony abnormality
IMPRESSION: Low lung volumes with mild bibasilar atelectasis and/or infiltrates.

## 2017-11-09 NOTE — Telephone Encounter (Signed)
She is aware of the plan. [name] has her taking Questran. She has responded to this and will continue this for now.

## 2017-11-13 ENCOUNTER — Ambulatory Visit: Payer: Medicare Other | Admitting: Podiatry

## 2017-11-16 ENCOUNTER — Telehealth: Payer: Self-pay | Admitting: Physician Assistant

## 2017-11-16 NOTE — Telephone Encounter (Signed)
Spoke with the patient. She had stopped taking Imodium because [name] has her taking Questran once daily. She has continued to take Bentyl twice daily. States "no one can help me because this has been going on for years" and "I can't feel nothing down there anyhow." Describes incontinent stools during sleep. Then offers that she can go 5 or 7 days without any problems.  She wants an appointment to see the doctor. She agrees to add the Imodium back and continue the bentyl and the Questran.  Is there anything else? I will call her in a few days to check on her progress.

## 2017-11-20 NOTE — Telephone Encounter (Signed)
Please ask her to increase the imodium to twice daily every day , one in am , and one after dinner

## 2017-11-20 NOTE — Telephone Encounter (Signed)
Spoke with pt and she is aware.

## 2017-11-22 ENCOUNTER — Telehealth: Payer: Self-pay

## 2017-11-22 ENCOUNTER — Telehealth: Payer: Self-pay | Admitting: Endocrinology

## 2017-11-22 NOTE — Telephone Encounter (Signed)
Patient would like a call back to discuss her sugars,. She stated that are up and down and here recently has been low.  Please advise

## 2017-11-22 NOTE — Telephone Encounter (Signed)
For now she can reduce her NovoLog by 2 units at every meal because there is no consistent pattern.  She needs to discuss her diarrhea with the gastroenterologist

## 2017-11-22 NOTE — Telephone Encounter (Signed)
Called pt and verbalized understanding to reduce her insulin.  Pt stated to "tell Dr. Dwyane Dee to stop telling me to see a gastroenterologist. He has been telling me that crap for 6 months." She stated that she has an appointment with Dr. Fuller Plan in September. Pt then stated "Dr. Dwyane Dee tried and tried to get me to drop my doctor at [facility], and he was a good doctor. He wanted me to switch to [facility] so all my records would be on the same system.Then I went to another doctor and everyone said, oh he knows what he's doing. He's brilliant, and he didn't do anything except give me some probiotic stuff and as it turns out, he didn't know crap. I just want some old country doctor that knows what in the hell he's doing."

## 2017-11-22 NOTE — Telephone Encounter (Signed)
Pt was called and she stated that her blood sugars have been low. When I asked for her to provide her blood sugar readings for at least 3 days, the pt became angry and stated "well just forget it, if I begin to feel sick, I'll just go to the hospital.

## 2017-11-22 NOTE — Telephone Encounter (Signed)
May need to know what is the lowest blood sugar she has and also what time of the day she has low sugars.  Confirm that she is taking the dose recommended on the last visit

## 2017-11-22 NOTE — Telephone Encounter (Signed)
Pt called back again requesting a call back to discuss blood sugars. She stated that his assistant doesn't know what he is doing and expects a call back before 5pm or she will call [name] herself.

## 2017-11-22 NOTE — Telephone Encounter (Signed)
Pt's CNA called back and gave blood sugar readings. They are as follows: 11/22/2017 Lunch- 252 Morning- 124  11/21/2017 2119- 252 1329- 93 0618- 150 0408- 145  11/20/17 2302- 92 1820- 230 1201-115 0719- 182 0056- 236  11/19/17 1336- 86 1111-97 5329-92  11/18/17 1044-214 [phone]  11/17/17 2301-79 [phone]  11/16/17 0949-110 [phone]  Pt is worried about her blood sugars and stated that they are running really low. She also stated that the diarrhea that she frequently has, stopped for 2 days, but has resumed.

## 2017-11-26 ENCOUNTER — Telehealth: Payer: Self-pay

## 2017-11-26 ENCOUNTER — Other Ambulatory Visit: Payer: Self-pay

## 2017-11-26 MED ORDER — PRAVASTATIN SODIUM 20 MG PO TABS: 20.0000 mg | ORAL_TABLET | Freq: Every day | ORAL | 5 refills | Status: DC

## 2017-11-26 MED ORDER — PROMETHAZINE HCL 25 MG PO TABS: 25.0000 mg | ORAL_TABLET | Freq: Four times a day (QID) | ORAL | 0 refills | Status: DC | PRN

## 2017-11-26 NOTE — Telephone Encounter (Signed)
Copied from [place] [id number]. Topic: General - Other >> Nov 26, 2017 11:01 AM Cecelia Byars, NT wrote: Reason for CRM: Patient would like a return call Esther Hardy please call her at [phone] would like not elaborate

## 2017-11-26 NOTE — Telephone Encounter (Signed)
[name] is not my CMA any more and cannot take her calls

## 2017-11-26 NOTE — Telephone Encounter (Signed)
patient called today and she needs refills on pravastatin, if she is still supposed to be taking it- she also needs promethazine  Good call back number is [phone]

## 2017-11-26 NOTE — Telephone Encounter (Signed)
Rx sent to pharmacy listed in chart.

## 2017-11-27 ENCOUNTER — Other Ambulatory Visit: Payer: Self-pay

## 2017-11-27 MED ORDER — MECLIZINE HCL 25 MG PO TABS: ORAL_TABLET | ORAL | 3 refills | Status: DC

## 2017-11-27 NOTE — Telephone Encounter (Signed)
Called pt and she stated that she was aware that Sheffield Slider no longer worked at [facility], and that is why she contacted a different office. She stated that [place] and her were personal friends and that she lost Megan's number and that she had a gift she would like to give [place]. She was advised that no one can give out her number and that it was not advisable to call the doctors office seeking an employee for personal reasons as it could often appear to be unethical.

## 2017-11-29 ENCOUNTER — Encounter (INDEPENDENT_AMBULATORY_CARE_PROVIDER_SITE_OTHER): Payer: Self-pay | Admitting: Orthopedic Surgery

## 2017-11-29 ENCOUNTER — Ambulatory Visit (INDEPENDENT_AMBULATORY_CARE_PROVIDER_SITE_OTHER): Payer: Medicare Other | Admitting: Orthopedic Surgery

## 2017-11-29 ENCOUNTER — Telehealth (INDEPENDENT_AMBULATORY_CARE_PROVIDER_SITE_OTHER): Payer: Self-pay | Admitting: Physical Medicine and Rehabilitation

## 2017-11-29 DIAGNOSIS — S46212A Strain of muscle, fascia and tendon of other parts of biceps, left arm, initial encounter: Secondary | ICD-10-CM | POA: Diagnosis not present

## 2017-11-29 NOTE — Progress Notes (Signed)
Office Visit Note   Patient: [name]           Date of Birth: 12/11/1935           MRN: [id number] Visit Date: 11/29/2017              Requested by: [name], MD [address] [place], [place] [id number] PCP: [name], MD  Chief Complaint  Patient presents with  . Left Arm - Follow-up      HPI: Patient presents in follow-up status post rupture of the long head of the biceps proximally.  Patient states her arm is doing much better.  She states she has chronic lower back pain for which she undergoes epidural steroid injections for her scoliosis she has had a ulnar nerve release at the right elbow and complains of tingling and numbness over the ulnar nerve on the left elbow.  She states that bothers her and when she strikes her elbow.  Assessment & Plan: Visit Diagnoses:  1. Biceps rupture, proximal, left, initial encounter     Plan: Recommended not resting on the left elbow patient would like to follow-up with Dr. Ernestina Patches for evaluation for epidural steroid injections for her lumbar spine no intervention for the biceps long head tendon.  Follow-Up Instructions: Return if symptoms worsen or fail to improve.   Ortho Exam  Patient is alert, oriented, no adenopathy, well-dressed, normal affect, normal respiratory effort. Examination patient ambulates with a cane she is on nasal cannula FiO2.  She does have a Tinel sign on the left elbow over the ulnar nerve she has no intrinsic atrophy in the left hand.  She is status post ulnar nerve decompression on the right elbow.  Her arm is asymptomatic from the biceps tendon long head rupture and the ecchymosis and bruising is almost completely resolved.  Patient complains of chronic lower back pain but no radicular symptoms at this time.  Imaging: No results found. No images are attached to the encounter.  Labs: Lab Results  Component Value Date   HGBA1C 9.1 09/05/2017   HGBA1C 9.0 05/07/2017   HGBA1C 8.1 12/20/2016    LABURIC 4.1 11/16/2014   REPTSTATUS 11/09/2016 FINAL 11/08/2016   CULT NO GROWTH 11/08/2016   LABORGA STAPHYLOCOCCUS SPECIES (COAGULASE NEGATIVE) 05/24/2015     Lab Results  Component Value Date   ALBUMIN 4.0 11/06/2017   ALBUMIN 3.9 05/07/2017   ALBUMIN 4.3 12/20/2016   LABURIC 4.1 11/16/2014    There is no height or weight on file to calculate BMI.  Orders:  No orders of the defined types were placed in this encounter.  No orders of the defined types were placed in this encounter.    Procedures: No procedures performed  Clinical Data: No additional findings.  ROS:  All other systems negative, except as noted in the HPI. Review of Systems  Objective: Vital Signs: There were no vitals taken for this visit.  Specialty Comments:  No specialty comments available.  PMFS History: Patient Active Problem List   Diagnosis Date Noted  . Diarrhea 11/08/2016  . Thrombocytopenia ([place]) 11/08/2016  . Degenerative lumbar spinal stenosis 10/27/2015  . Arthralgia 09/22/2015  . Chronic respiratory failure with hypoxia ([place]) 01/11/2015  . Diabetic polyneuropathy associated with type 2 diabetes mellitus ([place]) 11/16/2014  . Essential hypertension, benign 05/19/2013  . Vertigo 05/11/2013  . Dizziness 05/11/2013  . Tachycardia 05/11/2013  . Lower urinary tract infectious disease 05/11/2013  . Sinusitis 05/11/2013  . Other and  unspecified hyperlipidemia 02/15/2013  . Type II diabetes mellitus, uncontrolled ([place]) 01/01/2013  . Benign paroxysmal positional vertigo 08/09/2012  . Type II or unspecified type diabetes mellitus with neurological manifestations, uncontrolled(250.62) 08/09/2012  . GLAUCOMA 03/16/2010  . HOARSENESS 03/16/2010  . COUGH 12/23/2009  . CHEST PAIN 12/23/2009  . MUSCULOSKELETAL PAIN 01/28/2009  . COPD mixed type ([place]) 01/15/2009  . Seasonal and perennial allergic rhinitis 11/23/2007  . CHRONIC RHINITIS 07/16/2007  . G E R D 05/29/2007  . I B  S-DIARRHEAL PREDOMINATE 05/29/2007  . FATTY LIVER DISEASE 05/29/2007   Past Medical History:  Diagnosis Date  . Allergic rhinitis, cause unspecified    Sinus CT Rec 12-23-2009  . Arthritis   . Benign paroxysmal positional vertigo 08/09/2012  . 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 Complication of anesthesia    takes along time to wake up  . Diabetes mellitus   . Diarrhea   . Diverticulosis   . Esophageal reflux   . Esophageal stricture   . GERD (gastroesophageal reflux disease)   . Glaucoma   . Hiatal hernia   . Hypertension   . Irritable bowel syndrome   . Neuropathy in diabetes ([place]) 08/09/2012  . Other chronic nonalcoholic liver disease   . Oxygen deficiency   . Sciatica   . Scoliosis   . Shingles 2010  . Shortness of breath   . Steatohepatitis     Family History  Problem Relation Age of Onset  . Heart disease Father   . Lung cancer Mother        small cell;Byssinosis  . Lung cancer Sister   . Liver cancer Sister        ? mets from another area of the body  . Diabetes Unknown        grandmother  . Stroke Maternal Grandfather     Past Surgical History:  Procedure Laterality Date  . APPENDECTOMY    . CARPAL TUNNEL RELEASE     right hand  . CHOLECYSTECTOMY OPEN  1978  . COLONOSCOPY  07-2001   mild diverticulosis  . ESOPHAGOGASTRODUODENOSCOPY  [id number]   H Hernia,es.stricture s/p dil 91F  . INTRAOCULAR LENS INSERTION Bilateral   . LIVER BIOPSY  [phone]  . PARATHYROID EXPLORATION    . TONSILLECTOMY AND ADENOIDECTOMY    . TOTAL ABDOMINAL HYSTERECTOMY    . ULNAR NERVE TRANSPOSITION  12/07/2011   Procedure: ULNAR NERVE DECOMPRESSION/TRANSPOSITION;this was cancelled-not done  Surgeon: Cammie Sickle., MD;  Location: [facility];  Service: Orthopedics;  Laterality: Right;  right ulnar nerve in situ decompression  . ULNAR TUNNEL RELEASE  03/07/2012   Procedure: CUBITAL TUNNEL RELEASE;  Surgeon:  Roseanne Kaufman, MD;  Location: [facility];  Service: Orthopedics;  Laterality: Right;  ulnar nerve release at the elbow     Social History   Occupational History  . Occupation: Retired   Tobacco Use  . Smoking status: Former Smoker    Types: Cigarettes    Last attempt to quit: 05/08/1988    Years since quitting: 29.5  . Smokeless tobacco: Never Used  Substance and Sexual Activity  . Alcohol use: No    Alcohol/week: 0.0 oz  . Drug use: No  . Sexual activity: Never

## 2017-11-29 NOTE — Telephone Encounter (Signed)
Pt would like to make an appt

## 2017-11-30 ENCOUNTER — Telehealth: Payer: Self-pay | Admitting: Emergency Medicine

## 2017-11-30 NOTE — Telephone Encounter (Signed)
Would recommend that she take only half a tablet at a time

## 2017-11-30 NOTE — Telephone Encounter (Signed)
Pt called and ask that you give her a call back. She is having issues with her medication meclizine (ANTIVERT) 25 MG tablet and would like to discuss them with you. Thanks.

## 2017-11-30 NOTE — Telephone Encounter (Signed)
Called pt and informed her of Dr. Ronnie Derby advise. Pt verbalized understanding.

## 2017-11-30 NOTE — Telephone Encounter (Signed)
Called pt and she stated that she took a meclizine and she could struggle to stay awake. She was worried that the pharmacy gave her the wrong medication. I told her to call the pharmacy and inform them. She stated that she called the pharmacy and they assured her that it was meclizine that she was given, but she is worried and she wanted to let Dr. Dwyane Dee know.

## 2017-12-03 ENCOUNTER — Telehealth: Payer: Self-pay | Admitting: Internal Medicine

## 2017-12-03 MED ORDER — PREDNISONE 5 MG PO TABS: ORAL_TABLET | ORAL | 0 refills | Status: DC

## 2017-12-03 NOTE — Telephone Encounter (Signed)
Spoke with patient. She stated that she has been bothered by a dry cough for the past few weeks. She believes the cough is related to allergies. She recently walked into the grocery store and instantly had a coughing spell. Cough is not productive. She denies any SOB, fever or body aches. She wants to see if CY will call in a round of Prednisone for her. Per patient, she was given this years ago and it worked.   Patient wishes to use [facility].   CY, please advise. Thanks!

## 2017-12-03 NOTE — Telephone Encounter (Signed)
Spoke with patient. She is aware of CY's recs. Will go ahead and call in medication for patient. She verbalized understanding about dosage and instructions.   Nothing further needed at time of call.

## 2017-12-03 NOTE — Telephone Encounter (Signed)
Prednisone 5 mg, # 20, 4 X 2 DAYS, 3 X 2 DAYS, 2 X 2 DAYS, 1 X 2 DAYS

## 2017-12-05 ENCOUNTER — Other Ambulatory Visit: Payer: Self-pay

## 2017-12-05 MED ORDER — INSULIN PEN NEEDLE 31G X 8 MM MISC: 5 refills | Status: DC

## 2017-12-06 ENCOUNTER — Telehealth: Payer: Self-pay | Admitting: Endocrinology

## 2017-12-06 DIAGNOSIS — R300 Dysuria: Secondary | ICD-10-CM | POA: Diagnosis not present

## 2017-12-06 NOTE — Telephone Encounter (Signed)
The patients CNA would like a call back about paper work that was faxed over last week for patients prescription.   Please advise [phone]

## 2017-12-06 NOTE — Telephone Encounter (Signed)
Called pt back and spoke with her social security case worker who was at her home. Case worker stated that she faxed a form to this office for Dr. Dwyane Dee to sign. The form is saying that the pt needs adult incontinent supplies r/t severe diarrhea. If Dr. Dwyane Dee signs the form, the pt will not have to pay out of pocket for these supplies. The form will be faxed again.

## 2017-12-06 NOTE — Telephone Encounter (Signed)
Called pt and left vm

## 2017-12-07 ENCOUNTER — Other Ambulatory Visit: Payer: Self-pay

## 2017-12-07 ENCOUNTER — Telehealth: Payer: Self-pay | Admitting: Emergency Medicine

## 2017-12-07 MED ORDER — BD PEN NEEDLE MINI U/F 31G X 5 MM MISC: 3 refills | Status: DC

## 2017-12-07 NOTE — Telephone Encounter (Signed)
Pt called and needs a refill on her Insulin Pen Needle (B-D ULTRAFINE III SHORT PEN) 31G X 8 MM MISC. Pharmacy is [facility]. Thanks.

## 2017-12-07 NOTE — Telephone Encounter (Signed)
Rx sent 

## 2017-12-10 ENCOUNTER — Telehealth: Payer: Self-pay | Admitting: Endocrinology

## 2017-12-10 NOTE — Telephone Encounter (Signed)
Patient stated that her case worker faxed over paperwork and they have not received this back. And would like an update since her case worker will be going out of the country soon   Please advise

## 2017-12-10 NOTE — Telephone Encounter (Signed)
Paperwork has not yet been received by this office.

## 2017-12-11 ENCOUNTER — Telehealth: Payer: Self-pay | Admitting: Endocrinology

## 2017-12-11 NOTE — Telephone Encounter (Signed)
Please advise if you see the form

## 2017-12-11 NOTE — Telephone Encounter (Signed)
Unable to find this form, is this a form you have? Please advise

## 2017-12-11 NOTE — Telephone Encounter (Signed)
I have not seen any forms

## 2017-12-11 NOTE — Telephone Encounter (Signed)
Patient and her caregiver (CNA) called re: paperwork from caseworker needing to be signed by Dr. Dwyane Dee before caseworker leaves for [place] this Friday for 41 days (per previous telephone note ). They asked to speak with Noah-I told them he was not here due to an emergency. Miss Printup started calling Olen Cordial a liar and lazy. Miss Caulfield stated that since Dr. Dwyane Dee moved to this office, nobody knows what they are doing-this office is a mess among other negative put downs. Both miss Hahne and the caregiver were putting down Olen Cordial and our office-I told them I would see if our Manager was available for them to talk to. In previous note from Noah-said paperwork was not received-Miss Holladay was asked by front staff to reach out to her caseworker and have her refax paperwork so that it could be given to Noah/Dr. Dwyane Dee. Miss Shetterly was not happy with this. Courtney Short picked up the phone to tell Miss Corum we would have the Office Manager reach out to her-Miss [employer] at [place] "your whole office is liars". Courtney let miss Zavaleta know that we would be ending the phone call and told miss Kouba to have a good day.

## 2017-12-11 NOTE — Telephone Encounter (Signed)
Caseworker is going to [place] - leaving Friday-gone for 45 days-they need papers signed before caseworker leaves-this is extremely important-been going on 2 weeks-upset

## 2017-12-12 ENCOUNTER — Encounter: Payer: Self-pay | Admitting: Podiatry

## 2017-12-12 ENCOUNTER — Ambulatory Visit (INDEPENDENT_AMBULATORY_CARE_PROVIDER_SITE_OTHER): Payer: Medicare Other | Admitting: Podiatry

## 2017-12-12 DIAGNOSIS — M79674 Pain in right toe(s): Secondary | ICD-10-CM | POA: Diagnosis not present

## 2017-12-12 DIAGNOSIS — M79675 Pain in left toe(s): Secondary | ICD-10-CM

## 2017-12-12 DIAGNOSIS — E1142 Type 2 diabetes mellitus with diabetic polyneuropathy: Secondary | ICD-10-CM

## 2017-12-12 DIAGNOSIS — B351 Tinea unguium: Secondary | ICD-10-CM | POA: Diagnosis not present

## 2017-12-12 NOTE — Telephone Encounter (Signed)
Forms have been faxed for patient.

## 2017-12-12 NOTE — Telephone Encounter (Signed)
Please draft a letter which I can sign to let her know that unless we can see a change in her attitude and language on the phone we will have to dismiss her

## 2017-12-12 NOTE — Progress Notes (Signed)
This patient presents to the office with chief complaint of long thick nails and diabetic feet.  This patient  says there  is  no pain and discomfort in her  feet.  This patient says there are long thick painful nails.  These nails are painful walking and wearing shoes.  Patient has no history of infection or drainage from both feet.  Patient is unable to  self treat his own nails . This patient presents  to the office today for treatment of the  long nails and a foot evaluation due to history of  diabetes.  General Appearance  Alert, conversant and in no acute stress.  Vascular  Dorsalis pedis and posterior tibial  pulses are palpable  bilaterally.  Capillary return is within normal limits  bilaterally. Temperature is within normal limits  bilaterally.  Neurologic  Senn-Weinstein monofilament wire test absent   bilaterally. Muscle power within normal limits bilaterally.  Nails Thick disfigured discolored nails with subungual debris  from hallux to fifth toes bilaterally. No evidence of bacterial infection or drainage bilaterally.  Orthopedic  No limitations of motion of motion feet .  No crepitus or effusions noted.  No bony pathology or digital deformities noted. Pincer nails noted.  Skin  normotropic skin with no porokeratosis noted bilaterally.  No signs of infections or ulcers noted.     Onychomycosis  Diabetes with neuropathy.  IE  Debride nails x 10.  A diabetic foot exam was performed and there is no evidence of any vascular  pathology.  Absent LOPS noted.   RTC 3 months.   Gardiner Barefoot DPM

## 2017-12-13 NOTE — Telephone Encounter (Signed)
Spoke with pt and she states she will talk with Dr. Maia Petties before she schedules an ov with Dr. Ernestina Patches. Pt will call back once she speaks with her dr.

## 2017-12-13 NOTE — Telephone Encounter (Signed)
Letter typed up and with MD for review.

## 2017-12-14 DIAGNOSIS — Z961 Presence of intraocular lens: Secondary | ICD-10-CM | POA: Diagnosis not present

## 2017-12-14 DIAGNOSIS — H04123 Dry eye syndrome of bilateral lacrimal glands: Secondary | ICD-10-CM | POA: Diagnosis not present

## 2017-12-14 DIAGNOSIS — H26491 Other secondary cataract, right eye: Secondary | ICD-10-CM | POA: Diagnosis not present

## 2017-12-14 DIAGNOSIS — E119 Type 2 diabetes mellitus without complications: Secondary | ICD-10-CM | POA: Diagnosis not present

## 2017-12-14 DIAGNOSIS — H35372 Puckering of macula, left eye: Secondary | ICD-10-CM | POA: Diagnosis not present

## 2017-12-17 ENCOUNTER — Other Ambulatory Visit: Payer: Self-pay | Admitting: Endocrinology

## 2017-12-24 ENCOUNTER — Telehealth: Payer: Self-pay | Admitting: Physician Assistant

## 2017-12-24 NOTE — Telephone Encounter (Signed)
Spoke with the patient. She has not had any change in her symptoms or diarrhea. It continues to be unpredictable and unresponsive to her medications. She was taking Questran, Bentyl and Imodium. She did not see improvement. She stopped the Questran. Diarrhea did not worsen, but continued as always. "Why take it if it isn't helping." She has continued with Bentyl. She has used Imodium up to TID. Most of the time she remains inside with her cat and her nurse assistant. She has difficulty leaving the house because she cannot predict the diarrhea. Now also has gas that she states "will blow diarrhea everywhere."  Appointment made to evaluate. Last OV was 10/17/17.

## 2017-12-25 ENCOUNTER — Encounter: Payer: Self-pay | Admitting: Gastroenterology

## 2017-12-25 ENCOUNTER — Ambulatory Visit (INDEPENDENT_AMBULATORY_CARE_PROVIDER_SITE_OTHER): Payer: Medicare Other | Admitting: Gastroenterology

## 2017-12-25 ENCOUNTER — Telehealth: Payer: Self-pay | Admitting: Gastroenterology

## 2017-12-25 VITALS — BP 112/76 | HR 70 | Ht 63.0 in | Wt 189.1 lb

## 2017-12-25 DIAGNOSIS — K582 Mixed irritable bowel syndrome: Secondary | ICD-10-CM | POA: Diagnosis not present

## 2017-12-25 DIAGNOSIS — R300 Dysuria: Secondary | ICD-10-CM | POA: Diagnosis not present

## 2017-12-25 NOTE — Telephone Encounter (Signed)
Patient forgot to mention that Patient advised to continue the recommendations that were given to her in the office this am.  She will call back and let us know if her gas, diarrhea, and urgency are not improved with dietary changes.

## 2017-12-25 NOTE — Telephone Encounter (Signed)
Patient returning Sheri's call.

## 2017-12-25 NOTE — Progress Notes (Addendum)
    History of Present Illness: This is an 82 year old female with IBS. Diarrhea worse for the past year.  She describes a bowel pattern of going 2 to 4 days without a bowel movement and then having 1-2 days of diarrhea.  She frequently has urgent, loose stools associated with crampy lower abdominal pain.  She has not had any benefit from VSL#3, Questran.  She states she underwent evaluation by Dr. Alessandra Bevels earlier this year including colonoscopy.  She previously was evaluated by Dr. Delfin Edis in the distant past and Dr. Earlie Raveling in the recent past.  She saw Nicoletta Ba, PA-C in June and was placed on dicyclomine and Imodium.  She feels that dicyclomine and Imodium have helped her cramping and diarrhea.  She is very frustrated by her alternating bowel pattern and episodic urgency.  Current Medications, Allergies, Past Medical History, Past Surgical History, Family History and Social History were reviewed in [employer] record.  Physical Exam: General: Well developed, well nourished, no acute distress, O2 by [place] Head: Normocephalic and atraumatic Eyes:  sclerae anicteric, EOMI Ears: Normal auditory acuity Mouth: No deformity or lesions Lungs: Clear throughout to auscultation Heart: Regular rate and rhythm; no murmurs, rubs or bruits Abdomen: Soft, non tender and non distended. No masses, hepatosplenomegaly or hernias noted. Normal Bowel sounds Rectal: Not done Musculoskeletal: Symmetrical with no gross deformities  Pulses:  Normal pulses noted Extremities: No clubbing, cyanosis, edema or deformities noted Neurological: Alert oriented x 4, grossly nonfocal Psychological:  Alert and cooperative.  Anxious   Assessment and Recommendations:  1. IBS with an alternating pattern.  Episodic diarrhea with urgency and cramping are her most concerning complaints.  Rule out lactose and FODMAP intolerances.  She is advised to be on a completely lactose-free diet for 7 days  and if her symptoms have not substantially improved begin a low FODMAP diet for 3 weeks. Request records from Dr. Leanna Sato evaluation including colonoscopy with biopsies.  Continue dicyclomine 20 mg twice daily and Imodium 3 times daily as needed.  If above dietary adjustments are not helpful increase dicyclomine to 20 mg 3 times daily.  Follow-up visit 2 months.

## 2017-12-25 NOTE — Patient Instructions (Signed)
Start a complete lactose free diet x 7 days. If you see no improvement in your symptoms, then start a Low-fodmap diet x 3 weeks. If still no better then increase your dicyclomine to one tablet by mouth three times a day.   Thank you for choosing me and [facility].  Pricilla Riffle. Dagoberto Ligas., MD., Marval Regal

## 2017-12-25 NOTE — Telephone Encounter (Signed)
Left message for patient to call back  

## 2017-12-26 ENCOUNTER — Other Ambulatory Visit: Payer: Self-pay

## 2017-12-26 MED ORDER — METOCLOPRAMIDE HCL 5 MG PO TABS: 5.0000 mg | ORAL_TABLET | Freq: Four times a day (QID) | ORAL | 0 refills | Status: DC

## 2017-12-27 LAB — HM DIABETES EYE EXAM

## 2017-12-28 DIAGNOSIS — N3020 Other chronic cystitis without hematuria: Secondary | ICD-10-CM | POA: Diagnosis not present

## 2017-12-28 DIAGNOSIS — R300 Dysuria: Secondary | ICD-10-CM | POA: Diagnosis not present

## 2018-01-06 ENCOUNTER — Encounter

## 2018-01-08 ENCOUNTER — Encounter: Payer: Self-pay | Admitting: *Deleted

## 2018-01-17 ENCOUNTER — Telehealth: Payer: Self-pay | Admitting: Gastroenterology

## 2018-01-17 NOTE — Telephone Encounter (Signed)
Left message for patient to call back  

## 2018-01-18 NOTE — Telephone Encounter (Signed)
Pt returned your call and would like a call back.

## 2018-01-18 NOTE — Telephone Encounter (Signed)
Patient is advised to try FODmap diet per the last office note.  She will call with an update after trying the diet.

## 2018-01-21 ENCOUNTER — Ambulatory Visit: Payer: Medicare Other | Admitting: Gastroenterology

## 2018-01-23 ENCOUNTER — Telehealth: Payer: Self-pay | Admitting: Gastroenterology

## 2018-01-23 NOTE — Telephone Encounter (Signed)
FODMAP diet mailed again

## 2018-01-29 ENCOUNTER — Telehealth: Payer: Self-pay | Admitting: Internal Medicine

## 2018-01-29 DIAGNOSIS — J9611 Chronic respiratory failure with hypoxia: Secondary | ICD-10-CM

## 2018-01-29 NOTE — Telephone Encounter (Signed)
Ok to order [facility]ME [facility] Please change portable O2 system to POC 2L pulsed     Dx chronic hypoxic respiratory failure

## 2018-01-29 NOTE — Telephone Encounter (Signed)
The patient is  on liquid oxygen tanks at this time on 2 pulsed she states [facility] is having a hard time supplying these and would  Like to get a POC to help her stay mobile.  Last OV  08/10/17 Dr. Annamaria Boots please advise.

## 2018-01-29 NOTE — Telephone Encounter (Signed)
Called and spoke with pt regarding CY recommendations Placed order to [facility] to change portable O2 system to POC 2L pulsed  Pt is requesting simply go mini from [facility]-placed that request on the order. Nothing further needed at this time.

## 2018-01-30 DIAGNOSIS — N3020 Other chronic cystitis without hematuria: Secondary | ICD-10-CM | POA: Diagnosis not present

## 2018-01-31 ENCOUNTER — Telehealth: Payer: Self-pay | Admitting: Gastroenterology

## 2018-01-31 NOTE — Telephone Encounter (Signed)
Pt needs to speak with you regarding diet that was put on and new issues that has developed. Pls call her.

## 2018-01-31 NOTE — Telephone Encounter (Signed)
See office notes from me in August and AE in June. Chronic diarrhea likely from IBS, food intolerances. Trial of Align for 2 weeks, then Florastor for 2 weeks,  Culturelle for 2 weeks.  If one helps the remain on that probiotic.

## 2018-01-31 NOTE — Telephone Encounter (Signed)
Very nice lady who calls to discuss her symptom of diarrhea  Has been on the FODMAP diet "one day and cannot tell any difference." Says she "pretty much eats the foods listed on the die anyway"t so you could say she has "always been on it." Tried the lactose free for a week. She is not nauseated as much. She is going to stick with the lactose free. She is having problems with 'cystitis" that her doctor told her is likely going to continue to be an issue until she has the diarrhea under control. He has given her an antibiotic. She states "has had diarrhea for years."  She is asking if there is "a shot or something" that she can have

## 2018-02-01 ENCOUNTER — Telehealth: Payer: Self-pay | Admitting: Internal Medicine

## 2018-02-01 NOTE — Telephone Encounter (Signed)
Patient is advised.  

## 2018-02-01 NOTE — Telephone Encounter (Signed)
I advised the patient of this message she verbalized understanding nothing further needed at this time.  Georjean Mode, CMA     Note    Called and spoke with pt regarding CY recommendations Placed order to [facility] to change portable O2 system to POC 2L pulsed  Pt is requesting simply go mini from [facility]-placed that request on the order. Nothing further needed at this time

## 2018-02-05 ENCOUNTER — Telehealth: Payer: Self-pay | Admitting: Internal Medicine

## 2018-02-05 ENCOUNTER — Other Ambulatory Visit: Payer: Self-pay | Admitting: Endocrinology

## 2018-02-05 MED ORDER — PREDNISONE 5 MG PO TABS: 5.0000 mg | ORAL_TABLET | Freq: Every day | ORAL | 0 refills | Status: DC

## 2018-02-05 NOTE — Telephone Encounter (Signed)
Called and spoke with patient, she stated that she has had a cough for the past week. Non productive dry cough. No other symptoms. Patient stated that when she was given the 57m prednisone it helped. Patient would like this to be called in again. CY please advise.   Current Outpatient Medications on File Prior to Visit  Medication Sig Dispense Refill  . acetaminophen (TYLENOL) 500 MG tablet Take 1,000 mg by mouth every 6 (six) hours as needed for headache (pain).     .Marland KitchenALPRAZolam (XANAX) 0.5 MG tablet Half-1 tablet twice a day as needed for anxiety 60 tablet 4  . aspirin 325 MG tablet Take 325 mg by mouth daily.      . B-D UF III MINI PEN NEEDLES 31G X 5 MM MISC USE 5 PER DAY TO INJECT INSULIN 200 each 3  . cholestyramine (QUESTRAN) 4 g packet take 1 packet once daily if needed for diarrhea 60 packet 1  . dicyclomine (BENTYL) 10 MG capsule Take 1 capsule (10 mg total) by mouth 2 (two) times daily. 60 capsule 11  . glucose blood (ACCU-CHEK AVIVA PLUS) test strip Use as instructed to check blood sugars 8 times per day dx code E11.65 250 each 3  . Insulin Pen Needle (B-D ULTRAFINE III SHORT PEN) 31G X 8 MM MISC TEST 3 TIMES A DAY 100 each 5  . Insulin Syringe-Needle U-100 (BD INSULIN SYRINGE ULTRAFINE) 31G X 15/64" 0.5 ML MISC Use 2 per day dx code E11.65 100 each 5  . irbesartan (AVAPRO) 75 MG tablet Take 1 tablet (75 mg total) by mouth daily. 30 tablet 3  . Lancets (ACCU-CHEK MULTICLIX) lancets Use to check blood sugar 8 times per day. Dx code E11.9 700 each 2  . magic mouthwash SOLN Swish with 2 teaspoonfuls by mouth as needed (Patient taking differently: Take by mouth See admin instructions. Swish and spit small amount two times daily as needed for mouth sore) 240 mL 3  . meclizine (ANTIVERT) 25 MG tablet take 1 tablet by mouth three times a day if needed for dizziness/vertigo 30 tablet 3  . metoCLOPramide (REGLAN) 5 MG tablet Take 1 tablet (5 mg total) by mouth 4 (four) times daily. 90 tablet 0   . metoprolol succinate (TOPROL-XL) 25 MG 24 hr tablet Take 1 tablet (25 mg total) by mouth daily. 90 tablet 2  . NOVOLOG FLEXPEN 100 UNIT/ML FlexPen INJECT 18 UNITS EVERY MORNING WITH BREAKFAST, 22 UNITS AT LUNCH AND 24 UNITS AT SUPPER. 30 mL 0  . omeprazole (PRILOSEC) 20 MG capsule Take 1 capsule (20 mg total) by mouth daily. 90 capsule 2  . OXYGEN Inhale 3 L into the lungs continuous.     . pravastatin (PRAVACHOL) 20 MG tablet Take 1 tablet (20 mg total) by mouth daily. 90 tablet 5  . promethazine (PHENERGAN) 25 MG tablet Take 1 tablet (25 mg total) by mouth every 6 (six) hours as needed for nausea or vomiting. 30 tablet 0  . sodium chloride HYPERTONIC 3 % nebulizer solution 1 ampule neb every 6 hours if needed to clear mucus 75 mL 12  . Spacer/Aero-Holding Chambers (AEROCHAMBER MV) inhaler Use as instructed 1 each 0  . Tiotropium Bromide-Olodaterol (STIOLTO RESPIMAT) 2.5-2.5 MCG/ACT AERS Inhale 2 puffs into the lungs daily. 4 g 11  . TRESIBA FLEXTOUCH 200 UNIT/ML SOPN INJECT 94 UNITS SUBCUTANEOUSLY ONCE DAILY. 18 mL 0  . XOPENEX HFA 45 MCG/ACT inhaler inhale 2 puffs by mouth every 6 hours if needed for  wheezing or shortness of breath 15 g 11   No current facility-administered medications on file prior to visit.    Allergies  Allergen Reactions  . Chlordiazepoxide-Clidinium Other (See Comments)    Sleepy,weak  . Biaxin [Clarithromycin] Other (See Comments)    Foul taste, abd pain, diarrhea  . Tramadol Other (See Comments)    Caused tremors  . Codeine Other (See Comments)    REACTION: gi upset- only in high doses per pt.  Can tolerate cough syrup with codeine.

## 2018-02-05 NOTE — Telephone Encounter (Signed)
Prednisone 5 mg, # 7, 1 daily

## 2018-02-05 NOTE — Telephone Encounter (Signed)
Called and spoke with patient let her aware prednisone was being sent to her pharmacy.  Nothing further needed.

## 2018-02-06 ENCOUNTER — Ambulatory Visit (INDEPENDENT_AMBULATORY_CARE_PROVIDER_SITE_OTHER): Payer: Medicare Other | Admitting: Endocrinology

## 2018-02-06 ENCOUNTER — Encounter: Payer: Self-pay | Admitting: Endocrinology

## 2018-02-06 VITALS — BP 130/62 | HR 72 | Ht 63.0 in | Wt 186.0 lb

## 2018-02-06 DIAGNOSIS — E1165 Type 2 diabetes mellitus with hyperglycemia: Secondary | ICD-10-CM | POA: Diagnosis not present

## 2018-02-06 DIAGNOSIS — Z23 Encounter for immunization: Secondary | ICD-10-CM

## 2018-02-06 DIAGNOSIS — Z794 Long term (current) use of insulin: Secondary | ICD-10-CM | POA: Diagnosis not present

## 2018-02-06 DIAGNOSIS — E559 Vitamin D deficiency, unspecified: Secondary | ICD-10-CM | POA: Diagnosis not present

## 2018-02-06 LAB — COMPREHENSIVE METABOLIC PANEL
ALT: 15 U/L (ref 0–35)
AST: 26 U/L (ref 0–37)
Albumin: 4.1 g/dL (ref 3.5–5.2)
Alkaline Phosphatase: 38 U/L — ABNORMAL LOW (ref 39–117)
BUN: 12 mg/dL (ref 6–23)
CHLORIDE: 98 meq/L (ref 96–112)
CO2: 33 meq/L — AB (ref 19–32)
Calcium: 9.8 mg/dL (ref 8.4–10.5)
Creatinine, Ser: 0.76 mg/dL (ref 0.40–1.20)
GFR: 77.7 mL/min (ref >60.00)
Glucose, Bld: 91 mg/dL (ref 70–99)
Potassium: 4.2 mEq/L (ref 3.5–5.1)
Sodium: 138 mEq/L (ref 135–145)
Total Bilirubin: 0.4 mg/dL (ref 0.2–1.2)
Total Protein: 7.7 g/dL (ref 6.0–8.3)

## 2018-02-06 LAB — POCT GLYCOSYLATED HEMOGLOBIN (HGB A1C): HEMOGLOBIN A1C: 8.2 % — AB (ref 4.0–5.6)

## 2018-02-06 LAB — VITAMIN D 25 HYDROXY (VIT D DEFICIENCY, FRACTURES): VITD: 19.95 ng/mL — ABNORMAL LOW (ref 30.00–100.00)

## 2018-02-06 NOTE — Progress Notes (Signed)
Patient ID: [name], female   DOB: 1935-11-15, 82 y.o.   MRN: [id number]   Reason for Appointment: Diabetes and follow-up of multiple problems  History of Present Illness    PROBLEM 1:  DIABETES type II since 1980   She has been on insulin using Lantus and Humalog for several years and usually A1c around 8% despite fairly good compliance with diet, exercise and glucose monitoring. She also had been on Actos previously which probably benefited her especially with her fatty liver but she stopped it because of fear of side effects   She was changed from NovoLog to U-500 insulin starting in 02/2016; she did not related was controlling her sugars and preferred to go back to NovoLog.    Recent history:   Insulin regimen: Tresiba  60 units in a.m.  NOVOLOG before meals 18-18-28 units before meals  Her A1c has been previously consistently around 9- 10% and now improved at 8.2 compared to 9.1   Her Tyler Aas had been reduced after her last visit when she was having low normal morning sugars  However even though her average sugar is not high in the morning it is consistently high at least the last 3 days  Also blood sugars are trending higher in the afternoon and evening  As before she is showing considerable variability in her blood sugars  This is despite her having a relatively low appetite and losing some weight  She is eating less at lunchtime still has several readings around 200 before suppertime  She does not take extra NovoLog when her blood sugars are high before eating  Hypoglycemia has been minimal with only 2 readings below 70, once at bedtime  Her weight is fluctuating  She may occasionally forget to take her NovoLog before eating   Oral hypoglycemic drugs: none       Side effects from medications: None         Proper timing of medications in relation to meals: Yes.          Monitors blood glucose:  About 3-4 times a day   Glucometer:  Accu-Chek          Blood Glucose averages recently from meter download:   PRE-MEAL Fasting Lunch Dinner Bedtime Overall  Glucose range:  103-206  118-194  130-281  65-368   Mean/median:  132    1 9  165 +/-55   Previous readings  PRE-MEAL Fasting Lunch Dinner  overnight Overall  Glucose range:  77-265  63-275    63-306  Mean/median:  155  153  225  198  181+/-54   POST-MEAL PC Breakfast PC Lunch PC Dinner  Glucose range:    192-306  Mean/median:    218    Meals: 3 meals per day.  Breakfast 7 AM; lunch 1-2 pm Supper 6 to 7 PM     Physical activity: exercise: Unable to do any because of dyspnea and fatigue            Dietician visit: Most recent: Several years ago            Complications: are: Peripheral neuropathy with sensory loss     Wt Readings from Last 3 Encounters:  02/06/18 186 lb (84.4 kg)  12/25/17 189 lb 2 oz (85.8 kg)  11/06/17 185 lb 12.8 oz (84.3 kg)    Lab Results  Component Value Date   HGBA1C 8.2 (A) 02/06/2018   HGBA1C 9.1 09/05/2017   HGBA1C 9.0 05/07/2017  Lab Results  Component Value Date   MICROALBUR 1.1 05/11/2017   LDLCALC 75 11/06/2017   CREATININE 0.62 11/06/2017     OTHER Current problems  are detailed in review of systems   Allergies as of 02/06/2018      Reactions   Chlordiazepoxide-clidinium Other (See Comments)   Sleepy,weak   Biaxin [clarithromycin] Other (See Comments)   Foul taste, abd pain, diarrhea   Tramadol Other (See Comments)   Caused tremors   Codeine Other (See Comments)   REACTION: gi upset- only in high doses per pt.  Can tolerate cough syrup with codeine.       Medication List        Accurate as of 02/06/18 11:25 AM. Always use your most recent med list.          accu-chek multiclix lancets Use to check blood sugar 8 times per day. Dx code E11.9   acetaminophen 500 MG tablet Commonly known as:  TYLENOL Take 1,000 mg by mouth every 6 (six) hours as needed for headache (pain).   AEROCHAMBER MV inhaler Use as  instructed   ALPRAZolam 0.5 MG tablet Commonly known as:  XANAX Half-1 tablet twice a day as needed for anxiety   aspirin 325 MG tablet Take 325 mg by mouth daily.   cholestyramine 4 g packet Commonly known as:  QUESTRAN take 1 packet once daily if needed for diarrhea   dicyclomine 10 MG capsule Commonly known as:  BENTYL Take 1 capsule (10 mg total) by mouth 2 (two) times daily.   glucose blood test strip Use as instructed to check blood sugars 8 times per day dx code E11.65   Insulin Pen Needle 31G X 8 MM Misc TEST 3 TIMES A DAY   B-D UF III MINI PEN NEEDLES 31G X 5 MM Misc Generic drug:  Insulin Pen Needle USE 5 PER DAY TO INJECT INSULIN   Insulin Syringe-Needle U-100 31G X 15/64" 0.5 ML Misc Use 2 per day dx code E11.65   irbesartan 75 MG tablet Commonly known as:  AVAPRO Take 1 tablet (75 mg total) by mouth daily.   magic mouthwash Soln Swish with 2 teaspoonfuls by mouth as needed   meclizine 25 MG tablet Commonly known as:  ANTIVERT take 1 tablet by mouth three times a day if needed for dizziness/vertigo   methenamine 0.5 GM tablet Commonly known as:  MANDELAMINE   metoCLOPramide 5 MG tablet Commonly known as:  REGLAN Take 1 tablet (5 mg total) by mouth 4 (four) times daily.   metoprolol succinate 25 MG 24 hr tablet Commonly known as:  TOPROL-XL Take 1 tablet (25 mg total) by mouth daily.   NOVOLOG FLEXPEN 100 UNIT/ML FlexPen Generic drug:  insulin aspart INJECT 18 UNITS EVERY MORNING WITH BREAKFAST, 22 UNITS AT LUNCH AND 24 UNITS AT SUPPER.   omeprazole 20 MG capsule Commonly known as:  PRILOSEC Take 1 capsule (20 mg total) by mouth daily.   OXYGEN Inhale 3 L into the lungs continuous.   pravastatin 20 MG tablet Commonly known as:  PRAVACHOL Take 1 tablet (20 mg total) by mouth daily.   predniSONE 5 MG tablet Commonly known as:  DELTASONE Take 1 tablet (5 mg total) by mouth daily with breakfast.   promethazine 25 MG tablet Commonly known  as:  PHENERGAN Take 1 tablet (25 mg total) by mouth every 6 (six) hours as needed for nausea or vomiting.   sodium chloride HYPERTONIC 3 % nebulizer solution 1 ampule neb  every 6 hours if needed to clear mucus   Tiotropium Bromide-Olodaterol 2.5-2.5 MCG/ACT Aers Inhale 2 puffs into the lungs daily.   TRESIBA FLEXTOUCH 200 UNIT/ML Sopn Generic drug:  Insulin Degludec INJECT 94 UNITS SUBCUTANEOUSLY ONCE DAILY.   trimethoprim 100 MG tablet Commonly known as:  TRIMPEX   XOPENEX HFA 45 MCG/ACT inhaler Generic drug:  levalbuterol inhale 2 puffs by mouth every 6 hours if needed for wheezing or shortness of breath       Allergies:  Allergies  Allergen Reactions  . Chlordiazepoxide-Clidinium Other (See Comments)    Sleepy,weak  . Biaxin [Clarithromycin] Other (See Comments)    Foul taste, abd pain, diarrhea  . Tramadol Other (See Comments)    Caused tremors  . Codeine Other (See Comments)    REACTION: gi upset- only in high doses per pt.  Can tolerate cough syrup with codeine.     Past Medical History:  Diagnosis Date  . Allergic rhinitis, cause unspecified    Sinus CT Rec 12-23-2009  . Arthritis   . Benign paroxysmal positional vertigo 08/09/2012  . 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 Complication of anesthesia    takes along time to wake up  . Diabetes mellitus   . Diarrhea   . Diverticulosis   . Esophageal reflux   . Esophageal stricture   . GERD (gastroesophageal reflux disease)   . Glaucoma   . Hiatal hernia   . Hypertension   . Irritable bowel syndrome   . Neuropathy in diabetes ([place]) 08/09/2012  . Other chronic nonalcoholic liver disease   . Oxygen deficiency   . Sciatica   . Scoliosis   . Shingles 2010  . Shortness of breath   . Steatohepatitis     Past Surgical History:  Procedure Laterality Date  . APPENDECTOMY    . CARPAL TUNNEL RELEASE     right hand  . CHOLECYSTECTOMY OPEN  1978  .  COLONOSCOPY  07-2001   mild diverticulosis  . ESOPHAGOGASTRODUODENOSCOPY  3295,18-84   H Hernia,es.stricture s/p dil 80F  . INTRAOCULAR LENS INSERTION Bilateral   . LIVER BIOPSY  [phone]  . PARATHYROID EXPLORATION    . TONSILLECTOMY AND ADENOIDECTOMY    . TOTAL ABDOMINAL HYSTERECTOMY    . ULNAR NERVE TRANSPOSITION  12/07/2011   Procedure: ULNAR NERVE DECOMPRESSION/TRANSPOSITION;this was cancelled-not done  Surgeon: Cammie Sickle., MD;  Location: [facility];  Service: Orthopedics;  Laterality: Right;  right ulnar nerve in situ decompression  . ULNAR TUNNEL RELEASE  03/07/2012   Procedure: CUBITAL TUNNEL RELEASE;  Surgeon: Roseanne Kaufman, MD;  Location: [facility];  Service: Orthopedics;  Laterality: Right;  ulnar nerve release at the elbow      Family History  Problem Relation Age of Onset  . Heart disease Father   . Lung cancer Mother        small cell;Byssinosis  . Lung cancer Sister   . Liver cancer Sister        ? mets from another area of the body  . Diabetes Unknown        grandmother  . Stroke Maternal Grandfather     Social History:  reports that she quit smoking about 29 years ago. Her smoking use included cigarettes. She has never used smokeless tobacco. She reports that she does not drink alcohol or use drugs.  Review of Systems:  DIARRHEA: She was recommended taking Bentyl and Imodium by  her gastroenterology consultant This is however not helping Also not benefiting from lactose-free diet, currently is following another diet She has not taken Colestid which was at times helping her diarrhea previously She did have no diarrhea the last 2 days however  Currently the working diagnosis is still irritable bowel syndrome   HYPERTENSION:   blood pressure is controlled with adding 75 mg of irbesartan    BP Readings from Last 3 Encounters:  02/06/18 130/62  12/25/17 112/76  11/06/17 (!) 142/72    She takes Xanax at night mostly and  lorazepam as needed for anxiety  HYPERLIPIDEMIA:  She has had mixed hyperlipidemia, LDL treated with pravastatin 20 mg Last LDL adequately controlled     Lab Results  Component Value Date   CHOL 148 11/06/2017   HDL 52.30 11/06/2017   LDLCALC 75 11/06/2017   LDLDIRECT 89.0 10/14/2014   TRIG 104.0 11/06/2017   CHOLHDL 3 11/06/2017    She is followed by pulmonologist for chronic COPD, using home oxygen Also takes periodic prednisone which helps her cough but still has frequent cough including at night  She has had persistent joint pains and back pain     Examination:   BP 130/62   Pulse 72   Ht 5' 3"  (1.6 m)   Wt 186 lb (84.4 kg)   BMI 32.95 kg/m   Body mass index is 32.95 kg/m.   No swelling of the legs present  ASSESSMENT/ PLAN:    Diabetes type 2 with obesity, insulin-dependent  See history of present illness for detailed discussion of  current management, blood sugar patterns and problems identified  Her A1c is relatively better at 8.2 compared to 9.1  Blood sugars are fluctuating with no consistent pattern Also food intake is somewhat variable along with her mealtimes However her fasting readings appear to be trending mostly higher now Also has high readings before dinnertime or in the afternoon despite eating a small lunch  Recommendations: She will likely go up 4 units on her Tyler Aas again Discussed adjusting his further based on her morning blood sugar pattern She will need to be consistently taking her NovoLog with every meal For now we will increase her lunchtime dose to at least 20 and if afternoon readings are still high go up to 22 She will add additional insulin to her supper dose if blood sugars are starting to go up after evening meal also more consistently Consider adding Actos for insulin resistance  DIARRHEA: Still having persistent symptoms She is asking for additional treatment and discussed that she can still take Colestid as discussed  before She will take 1 tablet as needed when she starts having diarrhea and repeat another one after 8 hours if needed  HYPERTENSION: Controlled with 75 mg of Avapro daily  Low back pain: She will continue to work with her neurosurgeon for pain management  Nutritional status: She likely has vitamin D deficiency, also has had this previously and will recheck her level  LIPIDS: Well controlled as of the last visit on pravastatin  Total visit time for evaluation and management of multiple problems and counseling =25 minutes   Patient Instructions  TRESIBA 60 UNITS until am sugar <110  Novolog 20-22 at lunch    Influenza vaccine given  [name] 02/06/2018, 11:25 AM

## 2018-02-06 NOTE — Patient Instructions (Addendum)
TRESIBA 60 UNITS until am sugar <110  Novolog 20-22 at lunch

## 2018-02-07 ENCOUNTER — Telehealth: Payer: Self-pay

## 2018-02-07 MED ORDER — VITAMIN D (ERGOCALCIFEROL) 1.25 MG (50000 UNIT) PO CAPS: 50000.0000 [IU] | ORAL_CAPSULE | ORAL | 3 refills | Status: DC

## 2018-02-07 NOTE — Telephone Encounter (Signed)
-----   Message from [name], MD sent at 02/07/2018 12:20 PM EDT ----- Please call to let patient know that the vitamin D level is low, please send prescription for vitamin D, 50,000 units weekly, 4 capsules with 3 refills

## 2018-02-07 NOTE — Progress Notes (Signed)
Please call to let patient know that the vitamin D level is low, please send prescription for vitamin D, 50,000 units weekly, 4 capsules with 3 refills

## 2018-02-07 NOTE — Telephone Encounter (Signed)
Called pt and left vm #2.

## 2018-02-08 ENCOUNTER — Telehealth: Payer: Self-pay | Admitting: Internal Medicine

## 2018-02-08 ENCOUNTER — Telehealth: Payer: Self-pay | Admitting: Endocrinology

## 2018-02-08 NOTE — Telephone Encounter (Signed)
Pt didn't understand that she was to take her Vitamin D once a week and she took one Thursday and Friday. Pt want to know does she still need to take one next week or does she need to skip a week. Please advise pt

## 2018-02-08 NOTE — Telephone Encounter (Signed)
Called and advised pt to skip the pill next week, do I need to advise her of anything else?

## 2018-02-08 NOTE — Telephone Encounter (Signed)
That is correct

## 2018-02-08 NOTE — Telephone Encounter (Signed)
Called and spoke to patient, made patient aware that the POC are back logged and right now they do not have a date when they will be restocked. They are working as quickly as possible to get them back in stock and they will call her as soon as they have them. Voiced understanding. She also voiced on the phone that she would be running out of oxygen today. DME, [facility] [phone] contacted, left message informing them that we were calling to see when the patients oxygen could be delivered. Informed patient. Will await call back.

## 2018-02-08 NOTE — Telephone Encounter (Signed)
Spoke with Coralyn Mark at [facility]. They will be delivering her oxygen today. Called and informed patient. Nothing further is needed at this time.

## 2018-02-11 ENCOUNTER — Telehealth: Payer: Self-pay | Admitting: Endocrinology

## 2018-02-11 MED ORDER — MAGIC MOUTHWASH: ORAL | 3 refills | Status: DC

## 2018-02-11 NOTE — Telephone Encounter (Signed)
Rx magic mouth wash sent to [facility]

## 2018-02-11 NOTE — Telephone Encounter (Signed)
Pt needs prescription request sent over to the pharmacy. Questions about the powder form for the medication. Severe diarrhea occurred over the weekend, so questioning the medication.

## 2018-02-11 NOTE — Telephone Encounter (Signed)
She was told to try the Colestid tablets that she has at home.  She did not want to use the powder because of bad taste but if she wants we can send in Questran lite, 1 scoop twice a day as needed, 30-day supply

## 2018-02-12 ENCOUNTER — Telehealth: Payer: Self-pay | Admitting: Endocrinology

## 2018-02-12 ENCOUNTER — Other Ambulatory Visit: Payer: Self-pay | Admitting: Endocrinology

## 2018-02-12 MED ORDER — CHOLESTYRAMINE 4 G PO PACK: PACK | ORAL | 3 refills | Status: DC

## 2018-02-12 NOTE — Telephone Encounter (Signed)
On 02/12/18 pt called and said the following: "We need to get off our "ass". All of our "asses" need to be fired. We never do anything but sit around like some lazy "ass" people and no one ever returns her call".   She was upset because no one returned her call about the powder medication she had requested.

## 2018-02-12 NOTE — Telephone Encounter (Signed)
Please call patient

## 2018-02-12 NOTE — Telephone Encounter (Signed)
This is now the second time she has called and cussed at our staff and used profane language in an insulting manner to this office. We will need to discuss next steps

## 2018-02-12 NOTE — Telephone Encounter (Signed)
Rx questran lite 23m and mouth wash sent to [facility].

## 2018-02-12 NOTE — Telephone Encounter (Signed)
Sent!

## 2018-02-12 NOTE — Telephone Encounter (Signed)
Please call her to let her know that since we had already sent a letter that we are going to dismiss her

## 2018-02-12 NOTE — Telephone Encounter (Signed)
Patient has been contacted this has been resolved

## 2018-02-15 ENCOUNTER — Telehealth: Payer: Self-pay | Admitting: *Deleted

## 2018-02-15 NOTE — Telephone Encounter (Signed)
Notified pt partnership property [employer] form--is ready but needed her signature--mailed her form back to her and pt stated would mail it herself.

## 2018-02-15 NOTE — Telephone Encounter (Signed)
Called pt and lvm #3

## 2018-02-20 ENCOUNTER — Telehealth: Payer: Self-pay | Admitting: Gastroenterology

## 2018-02-20 NOTE — Telephone Encounter (Signed)
Patient is notified to add imodium up to 4 a day until OV next week on Monday

## 2018-02-20 NOTE — Telephone Encounter (Signed)
Pt has not responded to vms.

## 2018-02-20 NOTE — Telephone Encounter (Signed)
Patient states her diarrhea problem is getting worse and is requesting to speak with the nurse for advice. Patient has appt 10.21.19 with Dr.Stark.

## 2018-02-21 NOTE — Telephone Encounter (Signed)
Please complete the letter and I will call her and send it off

## 2018-02-25 ENCOUNTER — Encounter: Payer: Self-pay | Admitting: Gastroenterology

## 2018-02-25 ENCOUNTER — Ambulatory Visit (INDEPENDENT_AMBULATORY_CARE_PROVIDER_SITE_OTHER): Payer: Medicare Other | Admitting: Gastroenterology

## 2018-02-25 VITALS — BP 162/64 | HR 97 | Ht 63.0 in | Wt 185.2 lb

## 2018-02-25 DIAGNOSIS — K582 Mixed irritable bowel syndrome: Secondary | ICD-10-CM | POA: Diagnosis not present

## 2018-02-25 NOTE — Progress Notes (Signed)
    History of Present Illness: This is an 82 year old female returning for episodic diarrhea alternating with constipation.  She notes crampy abdominal pain prior to urgent diarrhea.  She did not note a benefit from a lactose-free diet or a low FODMAP diet.  She tried several different probiotics without any impact.  Records from Dr. Alessandra Bevels reviewed.  She underwent flexible sigmoidoscopy in March 2018 showing a moderate amount of stool in the rectum, sigmoid colon and descending colon interfering with visualization.  Internal hemorrhoids were noted.  Random biopsies showed normal colonic mucosa.  She previously took a course of Xifaxan for possible SIBO or IBS-D with no benefit.   Current Medications, Allergies, Past Medical History, Past Surgical History, Family History and Social History were reviewed in [employer] record.  Physical Exam: General: Well developed, well nourished, no acute distress Head: Normocephalic and atraumatic Eyes:  sclerae anicteric, EOMI Ears: Normal auditory acuity Mouth: No deformity or lesions Lungs: Clear throughout to auscultation Heart: Regular rate and rhythm; no murmurs, rubs or bruits Abdomen: Soft, non tender and non distended. No masses, hepatosplenomegaly or hernias noted. Normal Bowel sounds Rectal: Not done Musculoskeletal: Symmetrical with no gross deformities  Pulses:  Normal pulses noted Extremities: No clubbing, cyanosis, edema or deformities noted Neurological: Alert oriented x 4, grossly nonfocal Psychological:  Alert and cooperative. Anxious.    Assessment and Recommendations:  1.  IBS with alternating bowel pattern.  Episodes of diarrhea with urgency and cramping are most troubling for her and these episodes typically occur after 3 to 4 days without a bowel movement.  Will attempt to better manage her constipation.  Begin MiraLAX one half scoop to 1 scoop daily on a regular basis titrated for 1-2 complete bowel  movements per day.  She is advised to call us to report her progress in about 1 month.  We will not schedule an office visit at this time.  I reassured her that for now we would work with her on constipation management by phone.  Continue dicyclomine twice daily.  Minimize use of Imodium.

## 2018-02-25 NOTE — Patient Instructions (Signed)
You can take over the counter Miralax 1/2-1 scoop daily to titrate depending on your bowel movements.   Thank you for choosing me and [facility].  Pricilla Riffle. Dagoberto Ligas., MD., Mar[facility] Regal

## 2018-02-26 ENCOUNTER — Telehealth: Payer: Self-pay | Admitting: Internal Medicine

## 2018-02-26 MED ORDER — IPRATROPIUM-ALBUTEROL 0.5-2.5 (3) MG/3ML IN SOLN: 3.0000 mL | Freq: Three times a day (TID) | RESPIRATORY_TRACT | 12 refills | Status: DC | PRN

## 2018-02-26 NOTE — Telephone Encounter (Signed)
Suggest she see if the neb treatments will help chest tightness and cough. Would like to avoid prednisone side effects for a while, if we can.

## 2018-02-26 NOTE — Telephone Encounter (Signed)
[name] from [facility] [facility] has called in regards to patients duoneb. She is needing clarification on this.

## 2018-02-26 NOTE — Telephone Encounter (Signed)
Does she still have a nebulizer machine- I don't see listed in meds.  If not, suggest order through her [facility] DME----  Compressor nebulizer and Duoneb neb solution, # 25, 1 neb every 8 hours if needed, refill x 12  For dx COPD mixed type

## 2018-02-26 NOTE — Telephone Encounter (Signed)
Sent a new Rx of duoneb with the change in mls for the Rx.  [place] and spoke with Erline Levine letting her know that we have changed the Rx.  Stacey expressed understanding. Nothing further needed.

## 2018-02-26 NOTE — Telephone Encounter (Signed)
This Duoneb script was meant to be for 25 ampules (75 mls). Use every 8 hours or as needed.

## 2018-02-26 NOTE — Telephone Encounter (Signed)
Spoke to [place] with [facility], who is requesting clarification on Duoneb.  Rx was sent for 61m, which is a 3 day supply.  Per earlier phone note, CY instructed triage to order #232m  JiSharee Pimplelso states that Rx needs to stated q8h and prn or take as directed per insurance.  CY please advise on sig and dispense.  Current Outpatient Medications on File Prior to Visit  Medication Sig Dispense Refill  . acetaminophen (TYLENOL) 500 MG tablet Take 1,000 mg by mouth every 6 (six) hours as needed for headache (pain).     . Marland KitchenLPRAZolam (XANAX) 0.5 MG tablet Half-1 tablet twice a day as needed for anxiety (Patient taking differently: daily as needed. Half-1 tablet twice a day as needed for anxiety) 60 tablet 4  . aspirin 325 MG tablet Take 325 mg by mouth daily.      . B-D UF III MINI PEN NEEDLES 31G X 5 MM MISC USE 5 PER DAY TO INJECT INSULIN 200 each 3  . cholestyramine (QUESTRAN) 4 g packet 1 scoop/pack twice a day as needed. 60 each 3  . dicyclomine (BENTYL) 10 MG capsule Take 1 capsule (10 mg total) by mouth 2 (two) times daily. 60 capsule 11  . glucose blood (ACCU-CHEK AVIVA PLUS) test strip Use as instructed to check blood sugars 8 times per day dx code E11.65 250 each 3  . Insulin Pen Needle (B-D ULTRAFINE III SHORT PEN) 31G X 8 MM MISC TEST 3 TIMES A DAY 100 each 5  . Insulin Syringe-Needle U-100 (BD INSULIN SYRINGE ULTRAFINE) 31G X 15/64" 0.5 ML MISC Use 2 per day dx code E11.65 100 each 5  . ipratropium-albuterol (DUONEB) 0.5-2.5 (3) MG/3ML SOLN Take 3 mLs by nebulization every 8 (eight) hours as needed. 25 mL 12  . irbesartan (AVAPRO) 75 MG tablet Take 1 tablet (75 mg total) by mouth daily. 30 tablet 3  . Lancets (ACCU-CHEK MULTICLIX) lancets Use to check blood sugar 8 times per day. Dx code E11.9 700 each 2  . magic mouthwash SOLN Swish with 2 teaspoonfuls by mouth as needed (Patient taking differently: daily as needed. Swish with 2 teaspoonfuls by mouth as needed) 240 mL 3  .  meclizine (ANTIVERT) 25 MG tablet take 1 tablet by mouth three times a day if needed for dizziness/vertigo (Patient taking differently: daily as needed. take 1 tablet by mouth three times a day if needed for dizziness/vertigo) 30 tablet 3  . methenamine (MANDELAMINE) 0.5 GM tablet     . metoprolol succinate (TOPROL-XL) 25 MG 24 hr tablet Take 1 tablet (25 mg total) by mouth daily. 90 tablet 2  . NOVOLOG FLEXPEN 100 UNIT/ML FlexPen INJECT 18 UNITS EVERY MORNING WITH BREAKFAST, 22 UNITS AT LUNCH AND 24 UNITS AT SUPPER. (Patient taking differently: 18 units breakfast, 18 unit foe lunch, and 28 supper) 30 mL 0  . omeprazole (PRILOSEC) 20 MG capsule Take 1 capsule (20 mg total) by mouth daily. 90 capsule 2  . OXYGEN Inhale 3 L into the lungs continuous.     . pravastatin (PRAVACHOL) 20 MG tablet Take 1 tablet (20 mg total) by mouth daily. 90 tablet 5  . promethazine (PHENERGAN) 25 MG tablet Take 1 tablet (25 mg total) by mouth every 6 (six) hours as needed for nausea or vomiting. 30 tablet 0  . sodium chloride HYPERTONIC 3 % nebulizer solution 1 ampule neb every 6 hours if needed to clear mucus 75 mL 12  . Spacer/Aero-Holding Chambers (AEROCHAMBER MV)  inhaler Use as instructed 1 each 0  . Tiotropium Bromide-Olodaterol (STIOLTO RESPIMAT) 2.5-2.5 MCG/ACT AERS Inhale 2 puffs into the lungs daily. 4 g 11  . TRESIBA FLEXTOUCH 200 UNIT/ML SOPN INJECT 94 UNITS SUBCUTANEOUSLY ONCE DAILY. (Patient taking differently: 56 Units. ) 18 mL 0  . trimethoprim (TRIMPEX) 100 MG tablet     . Vitamin D, Ergocalciferol, (DRISDOL) 50000 units CAPS capsule Take 1 capsule (50,000 Units total) by mouth every 7 (seven) days. 4 capsule 3  . XOPENEX HFA 45 MCG/ACT inhaler inhale 2 puffs by mouth every 6 hours if needed for wheezing or shortness of breath 15 g 11   No current facility-administered medications on file prior to visit.     Allergies  Allergen Reactions  . Chlordiazepoxide-Clidinium Other (See Comments)     Sleepy,weak  . Biaxin [Clarithromycin] Other (See Comments)    Foul taste, abd pain, diarrhea  . Tramadol Other (See Comments)    Caused tremors  . Codeine Other (See Comments)    REACTION: gi upset- only in high doses per pt.  Can tolerate cough syrup with codeine.

## 2018-02-26 NOTE — Telephone Encounter (Signed)
Called and spoke with pt who stated her breathing is becoming worse. Pt states she feels like she is smothering more in the usual mainly in the mornings and states she is having more tightness in her chest.  Pt states she had a cough but states the prednisone that was prescribed had helped with that.  Dr. Annamaria Boots, please advise if we need to schedule an appt or if something can be prescribed to help with pt's symptoms.  Pt also states she has not been able to receive a POC due to [facility] being out of them and not knowing when they will be receiving anymore.  Allergies  Allergen Reactions  . Chlordiazepoxide-Clidinium Other (See Comments)    Sleepy,weak  . Biaxin [Clarithromycin] Other (See Comments)    Foul taste, abd pain, diarrhea  . Tramadol Other (See Comments)    Caused tremors  . Codeine Other (See Comments)    REACTION: gi upset- only in high doses per pt.  Can tolerate cough syrup with codeine.      Current Outpatient Medications:  .  acetaminophen (TYLENOL) 500 MG tablet, Take 1,000 mg by mouth every 6 (six) hours as needed for headache (pain). , Disp: , Rfl:  .  ALPRAZolam (XANAX) 0.5 MG tablet, Half-1 tablet twice a day as needed for anxiety (Patient taking differently: daily as needed. Half-1 tablet twice a day as needed for anxiety), Disp: 60 tablet, Rfl: 4 .  aspirin 325 MG tablet, Take 325 mg by mouth daily.  , Disp: , Rfl:  .  B-D UF III MINI PEN NEEDLES 31G X 5 MM MISC, USE 5 PER DAY TO INJECT INSULIN, Disp: 200 each, Rfl: 3 .  cholestyramine (QUESTRAN) 4 g packet, 1 scoop/pack twice a day as needed., Disp: 60 each, Rfl: 3 .  dicyclomine (BENTYL) 10 MG capsule, Take 1 capsule (10 mg total) by mouth 2 (two) times daily., Disp: 60 capsule, Rfl: 11 .  glucose blood (ACCU-CHEK AVIVA PLUS) test strip, Use as instructed to check blood sugars 8 times per day dx code E11.65, Disp: 250 each, Rfl: 3 .  Insulin Pen Needle (B-D ULTRAFINE III SHORT PEN) 31G X 8 MM MISC, TEST 3 TIMES A DAY,  Disp: 100 each, Rfl: 5 .  Insulin Syringe-Needle U-100 (BD INSULIN SYRINGE ULTRAFINE) 31G X 15/64" 0.5 ML MISC, Use 2 per day dx code E11.65, Disp: 100 each, Rfl: 5 .  irbesartan (AVAPRO) 75 MG tablet, Take 1 tablet (75 mg total) by mouth daily., Disp: 30 tablet, Rfl: 3 .  Lancets (ACCU-CHEK MULTICLIX) lancets, Use to check blood sugar 8 times per day. Dx code E11.9, Disp: 700 each, Rfl: 2 .  magic mouthwash SOLN, Swish with 2 teaspoonfuls by mouth as needed (Patient taking differently: daily as needed. Swish with 2 teaspoonfuls by mouth as needed), Disp: 240 mL, Rfl: 3 .  meclizine (ANTIVERT) 25 MG tablet, take 1 tablet by mouth three times a day if needed for dizziness/vertigo (Patient taking differently: daily as needed. take 1 tablet by mouth three times a day if needed for dizziness/vertigo), Disp: 30 tablet, Rfl: 3 .  methenamine (MANDELAMINE) 0.5 GM tablet, , Disp: , Rfl:  .  metoprolol succinate (TOPROL-XL) 25 MG 24 hr tablet, Take 1 tablet (25 mg total) by mouth daily., Disp: 90 tablet, Rfl: 2 .  NOVOLOG FLEXPEN 100 UNIT/ML FlexPen, INJECT 18 UNITS EVERY MORNING WITH BREAKFAST, 22 UNITS AT LUNCH AND 24 UNITS AT SUPPER. (Patient taking differently: 18 units breakfast, 18 unit foe  lunch, and 28 supper), Disp: 30 mL, Rfl: 0 .  omeprazole (PRILOSEC) 20 MG capsule, Take 1 capsule (20 mg total) by mouth daily., Disp: 90 capsule, Rfl: 2 .  OXYGEN, Inhale 3 L into the lungs continuous. , Disp: , Rfl:  .  pravastatin (PRAVACHOL) 20 MG tablet, Take 1 tablet (20 mg total) by mouth daily., Disp: 90 tablet, Rfl: 5 .  promethazine (PHENERGAN) 25 MG tablet, Take 1 tablet (25 mg total) by mouth every 6 (six) hours as needed for nausea or vomiting., Disp: 30 tablet, Rfl: 0 .  sodium chloride HYPERTONIC 3 % nebulizer solution, 1 ampule neb every 6 hours if needed to clear mucus, Disp: 75 mL, Rfl: 12 .  Spacer/Aero-Holding Chambers (AEROCHAMBER MV) inhaler, Use as instructed, Disp: 1 each, Rfl: 0 .  Tiotropium  Bromide-Olodaterol (STIOLTO RESPIMAT) 2.5-2.5 MCG/ACT AERS, Inhale 2 puffs into the lungs daily., Disp: 4 g, Rfl: 11 .  TRESIBA FLEXTOUCH 200 UNIT/ML SOPN, INJECT 94 UNITS SUBCUTANEOUSLY ONCE DAILY. (Patient taking differently: 56 Units. ), Disp: 18 mL, Rfl: 0 .  trimethoprim (TRIMPEX) 100 MG tablet, , Disp: , Rfl:  .  Vitamin D, Ergocalciferol, (DRISDOL) 50000 units CAPS capsule, Take 1 capsule (50,000 Units total) by mouth every 7 (seven) days., Disp: 4 capsule, Rfl: 3 .  XOPENEX HFA 45 MCG/ACT inhaler, inhale 2 puffs by mouth every 6 hours if needed for wheezing or shortness of breath, Disp: 15 g, Rfl: 11

## 2018-02-26 NOTE — Telephone Encounter (Signed)
Called and spoke with pt to see if she still has a nebulizer machine and pt stated to me she does.  Pt states she has sodium chloride neb solution.  Stated to pt that I was going to send duoneb neb sol to [facility] pharmacy for her so she can have it on hand to use as needed.  Pt expressed understanding.  Pt asked me if Dr. Annamaria Boots wanted her to be on something else to help with her cough; if he wanted her to be on another round of prednisone or what. Dr.Young, please advise on this for pt. Thanks!

## 2018-02-26 NOTE — Telephone Encounter (Signed)
Spoke with patient. She is aware of CY's recommendations. Nothing further needed at time of call.

## 2018-02-27 ENCOUNTER — Telehealth: Payer: Self-pay | Admitting: Endocrinology

## 2018-02-27 NOTE — Telephone Encounter (Signed)
Patient is stating they would like Vicente Males to take her message to give to Dr. Dwyane Dee. Please Advise. Ph # [phone]

## 2018-02-28 NOTE — Telephone Encounter (Signed)
Pt stated just wanted to Let dr. Dwyane Dee know pt went to GI specialty (Dr. Lolly Mustache they cannot fine anything what is going on with her diarrhea, constipation,  and releasing her from their practice. Pt stated concern today but if the symptoms get worse pt aware to go to the hospital.

## 2018-03-01 ENCOUNTER — Telehealth: Payer: Self-pay | Admitting: Internal Medicine

## 2018-03-01 ENCOUNTER — Other Ambulatory Visit: Payer: Self-pay

## 2018-03-01 DIAGNOSIS — N3020 Other chronic cystitis without hematuria: Secondary | ICD-10-CM | POA: Diagnosis not present

## 2018-03-01 DIAGNOSIS — N3941 Urge incontinence: Secondary | ICD-10-CM | POA: Diagnosis not present

## 2018-03-01 MED ORDER — INSULIN DEGLUDEC 200 UNIT/ML ~~LOC~~ SOPN: 60.0000 [IU] | PEN_INJECTOR | Freq: Every day | SUBCUTANEOUS | 3 refills | Status: DC

## 2018-03-01 MED ORDER — IPRATROPIUM-ALBUTEROL 0.5-2.5 (3) MG/3ML IN SOLN: RESPIRATORY_TRACT | 11 refills | Status: DC

## 2018-03-01 NOTE — Telephone Encounter (Signed)
Called and spoke with pt letting her know that we had spoken with the pharmacy in regards to her duoneb neb sol. Stated to her that we had to reclarify some of the instructions on the Rx and that they should call her soon to get the med shipped to her.  Pt stated her symptoms have become worse and I advised her we should get her scheduled for an appt due to her symptoms worsening. Pt declined an appt today and stated if she needed to come in by Monday that she would either call us for an appt or just go to the hospital.  Nothing further needed.

## 2018-03-01 NOTE — Telephone Encounter (Signed)
Spoke with [name]  They never received the updated rx  I have resent to her for #90 ml for 30 day supply and made directions say every 8 hours and as needed per medicare guidelines  Nothing further needed

## 2018-03-12 ENCOUNTER — Telehealth: Payer: Self-pay | Admitting: Internal Medicine

## 2018-03-12 MED ORDER — PREDNISONE 1 MG PO TABS: ORAL_TABLET | ORAL | 0 refills | Status: DC

## 2018-03-12 NOTE — Telephone Encounter (Signed)
Called and spoke to patient. Patient stated that she has been having an increased cough and thinks the Duoneb is contributing. Patient stated that she can't take anything with albuterol because 20 years ago it made things worse. Patient stated that the only thing that ever helps is prednisone. Patient declined to come in and see a provider and would like Dr. Annamaria Boots to advise.  CY please advise.  Allergies  Allergen Reactions  . Chlordiazepoxide-Clidinium Other (See Comments)    Sleepy,weak  . Biaxin [Clarithromycin] Other (See Comments)    Foul taste, abd pain, diarrhea  . Tramadol Other (See Comments)    Caused tremors  . Codeine Other (See Comments)    REACTION: gi upset- only in high doses per pt.  Can tolerate cough syrup with codeine.      Current Outpatient Medications on File Prior to Visit  Medication Sig Dispense Refill  . acetaminophen (TYLENOL) 500 MG tablet Take 1,000 mg by mouth every 6 (six) hours as needed for headache (pain).     Marland Kitchen ALPRAZolam (XANAX) 0.5 MG tablet Half-1 tablet twice a day as needed for anxiety (Patient taking differently: daily as needed. Half-1 tablet twice a day as needed for anxiety) 60 tablet 4  . aspirin 325 MG tablet Take 325 mg by mouth daily.      . B-D UF III MINI PEN NEEDLES 31G X 5 MM MISC USE 5 PER DAY TO INJECT INSULIN 200 each 3  . cholestyramine (QUESTRAN) 4 g packet 1 scoop/pack twice a day as needed. 60 each 3  . dicyclomine (BENTYL) 10 MG capsule Take 1 capsule (10 mg total) by mouth 2 (two) times daily. 60 capsule 11  . glucose blood (ACCU-CHEK AVIVA PLUS) test strip Use as instructed to check blood sugars 8 times per day dx code E11.65 250 each 3  . Insulin Degludec (TRESIBA FLEXTOUCH) 200 UNIT/ML SOPN Inject 60 Units into the skin daily. 18 mL 3  . Insulin Pen Needle (B-D ULTRAFINE III SHORT PEN) 31G X 8 MM MISC TEST 3 TIMES A DAY 100 each 5  . Insulin Syringe-Needle U-100 (BD INSULIN SYRINGE ULTRAFINE) 31G X 15/64" 0.5 ML MISC Use 2  per day dx code E11.65 100 each 5  . ipratropium-albuterol (DUONEB) 0.5-2.5 (3) MG/3ML SOLN 1 vial in neb every 8 hours and as needed 90 mL 11  . irbesartan (AVAPRO) 75 MG tablet Take 1 tablet (75 mg total) by mouth daily. 30 tablet 3  . Lancets (ACCU-CHEK MULTICLIX) lancets Use to check blood sugar 8 times per day. Dx code E11.9 700 each 2  . magic mouthwash SOLN Swish with 2 teaspoonfuls by mouth as needed (Patient taking differently: daily as needed. Swish with 2 teaspoonfuls by mouth as needed) 240 mL 3  . meclizine (ANTIVERT) 25 MG tablet take 1 tablet by mouth three times a day if needed for dizziness/vertigo (Patient taking differently: daily as needed. take 1 tablet by mouth three times a day if needed for dizziness/vertigo) 30 tablet 3  . methenamine (MANDELAMINE) 0.5 GM tablet     . metoprolol succinate (TOPROL-XL) 25 MG 24 hr tablet Take 1 tablet (25 mg total) by mouth daily. 90 tablet 2  . NOVOLOG FLEXPEN 100 UNIT/ML FlexPen INJECT 18 UNITS EVERY MORNING WITH BREAKFAST, 22 UNITS AT LUNCH AND 24 UNITS AT SUPPER. (Patient taking differently: 18 units breakfast, 18 unit foe lunch, and 28 supper) 30 mL 0  . omeprazole (PRILOSEC) 20 MG capsule Take 1 capsule (20 mg total)  by mouth daily. 90 capsule 2  . OXYGEN Inhale 3 L into the lungs continuous.     . pravastatin (PRAVACHOL) 20 MG tablet Take 1 tablet (20 mg total) by mouth daily. 90 tablet 5  . promethazine (PHENERGAN) 25 MG tablet Take 1 tablet (25 mg total) by mouth every 6 (six) hours as needed for nausea or vomiting. 30 tablet 0  . sodium chloride HYPERTONIC 3 % nebulizer solution 1 ampule neb every 6 hours if needed to clear mucus 75 mL 12  . Spacer/Aero-Holding Chambers (AEROCHAMBER MV) inhaler Use as instructed 1 each 0  . Tiotropium Bromide-Olodaterol (STIOLTO RESPIMAT) 2.5-2.5 MCG/ACT AERS Inhale 2 puffs into the lungs daily. 4 g 11  . trimethoprim (TRIMPEX) 100 MG tablet     . Vitamin D, Ergocalciferol, (DRISDOL) 50000 units CAPS  capsule Take 1 capsule (50,000 Units total) by mouth every 7 (seven) days. 4 capsule 3  . XOPENEX HFA 45 MCG/ACT inhaler inhale 2 puffs by mouth every 6 hours if needed for wheezing or shortness of breath 15 g 11   No current facility-administered medications on file prior to visit.

## 2018-03-12 NOTE — Telephone Encounter (Signed)
Pt is aware of below recommendations and voiced her understanding. Rx for prednisone has been sent to preferred pharmacy. Nothing further is needed.

## 2018-03-12 NOTE — Telephone Encounter (Signed)
Suggest prednisone 1 mg tabs, # 50     3 daily x 3 days, then 2 tabs every other day

## 2018-03-13 ENCOUNTER — Telehealth: Payer: Self-pay | Admitting: Internal Medicine

## 2018-03-13 ENCOUNTER — Ambulatory Visit: Payer: Medicare Other | Admitting: Podiatry

## 2018-03-13 ENCOUNTER — Other Ambulatory Visit: Payer: Self-pay | Admitting: Internal Medicine

## 2018-03-13 NOTE — Telephone Encounter (Signed)
Called and spoke to pt, who states Rx for pednsione was not received by [facility].  [facility] prednisone 55m [facility] per [facility] records.  [facility]  [facility] Pt is aware and voiced her understanding. Nothing further is needed.

## 2018-03-14 DIAGNOSIS — M47812 Spondylosis without myelopathy or radiculopathy, cervical region: Secondary | ICD-10-CM | POA: Diagnosis not present

## 2018-03-14 DIAGNOSIS — M5136 Other intervertebral disc degeneration, lumbar region: Secondary | ICD-10-CM | POA: Diagnosis not present

## 2018-03-21 ENCOUNTER — Telehealth: Payer: Self-pay | Admitting: Internal Medicine

## 2018-03-21 MED ORDER — AMOXICILLIN 500 MG PO CAPS: 500.0000 mg | ORAL_CAPSULE | Freq: Three times a day (TID) | ORAL | 0 refills | Status: DC

## 2018-03-21 NOTE — Telephone Encounter (Signed)
Called and spoke to patient, states she is having increased coughing with green and yellow mucous, denies fever, increased SOB worse than her baseline, states she recently completed a prednisone taper and still feels bad. States she awakened from her sleep choking and gagging on thick mucous. Reports her symptoms have progressively gotten worse over 3 days. Declined appt.    Allergies: Tramadol, Codeine, Biaxin, Chlordiazepoxide-clidinium.   CY please advise. Thanks.

## 2018-03-21 NOTE — Telephone Encounter (Signed)
Called pt and advised message from the provider. Pt understood and verbalized understanding. Nothing further is needed.   Rx sent to [facility]

## 2018-03-21 NOTE — Telephone Encounter (Signed)
Offer amoxacillin 500 mg, # 14, 1 twice daily

## 2018-03-27 ENCOUNTER — Telehealth: Payer: Self-pay | Admitting: Internal Medicine

## 2018-03-27 NOTE — Telephone Encounter (Signed)
Called patient, unable to reach left message to give us a call back. 

## 2018-03-27 NOTE — Telephone Encounter (Signed)
Pt is calling back [phone]

## 2018-03-27 NOTE — Telephone Encounter (Signed)
Called and spoke to patient, patient states her pharmacy did not have the cough pearls, so they transferred her refills to [name] at [facility] and they have filled it for her. She states she will be transferring over to them soon. Nothing further is needed at this time.

## 2018-03-29 DIAGNOSIS — N3941 Urge incontinence: Secondary | ICD-10-CM | POA: Diagnosis not present

## 2018-03-29 DIAGNOSIS — R351 Nocturia: Secondary | ICD-10-CM | POA: Diagnosis not present

## 2018-03-29 DIAGNOSIS — N3020 Other chronic cystitis without hematuria: Secondary | ICD-10-CM | POA: Diagnosis not present

## 2018-04-02 ENCOUNTER — Other Ambulatory Visit: Payer: Self-pay

## 2018-04-02 MED ORDER — IRBESARTAN 75 MG PO TABS: 75.0000 mg | ORAL_TABLET | Freq: Every day | ORAL | 3 refills | Status: DC

## 2018-04-08 ENCOUNTER — Other Ambulatory Visit: Payer: Self-pay | Admitting: Endocrinology

## 2018-04-17 ENCOUNTER — Telehealth (INDEPENDENT_AMBULATORY_CARE_PROVIDER_SITE_OTHER): Payer: Self-pay | Admitting: Orthopedic Surgery

## 2018-04-17 NOTE — Telephone Encounter (Signed)
Can you please call pt and make an appt to come in and be seen. Would be happy to eval whatever problem she is having.

## 2018-04-17 NOTE — Telephone Encounter (Signed)
Pt called wanting to know if Dr. Sharol Given could help her with her right arm.  She had surgery a year ago with Dr. Amedeo Plenty but she is in pain again with numbness and tingling. She expressed how she has tried to get into to see Dr. Amedeo Plenty again but they will not return her calls.  Pt # [phone]

## 2018-04-23 ENCOUNTER — Ambulatory Visit (INDEPENDENT_AMBULATORY_CARE_PROVIDER_SITE_OTHER): Payer: Medicare Other | Admitting: Physician Assistant

## 2018-04-23 ENCOUNTER — Encounter (INDEPENDENT_AMBULATORY_CARE_PROVIDER_SITE_OTHER): Payer: Self-pay | Admitting: Orthopedic Surgery

## 2018-04-23 ENCOUNTER — Ambulatory Visit (INDEPENDENT_AMBULATORY_CARE_PROVIDER_SITE_OTHER): Payer: Medicare Other

## 2018-04-23 VITALS — Ht 63.0 in | Wt 185.2 lb

## 2018-04-23 DIAGNOSIS — M25511 Pain in right shoulder: Secondary | ICD-10-CM

## 2018-04-23 DIAGNOSIS — M79601 Pain in right arm: Secondary | ICD-10-CM | POA: Diagnosis not present

## 2018-04-23 MED ORDER — METHYLPREDNISOLONE ACETATE 40 MG/ML IJ SUSP
40.0000 mg | INTRAMUSCULAR | Status: AC | PRN
  Administered 2018-04-23: 40 mg via INTRA_ARTICULAR

## 2018-04-23 MED ORDER — LIDOCAINE HCL 1 % IJ SOLN
5.0000 mL | INTRAMUSCULAR | Status: AC | PRN
  Administered 2018-04-23: 5 mL

## 2018-04-23 NOTE — Progress Notes (Signed)
Office Visit Note   Patient: [name]           Date of Birth: 1935/11/26           MRN: [id number] Visit Date: 04/23/2018              Requested by: [name], MD [place], [place] [address] PCP: [name], MD  Chief Complaint  Patient presents with  . Right Hand - Pain      HPI: The patient is a 82 yo woman with a history of previous surgery to the right arm for right cubital tunnel syndrome either in 2013 or 2015 who reports she developed acute onset of right arm pain about 2 weeks ago. She denies any trauma or injury. She has multiple medical problems and is on chronic oxygen therapy.  She reports intermittent numbness and tingling of the arm and reports it generally occurs when she tries to reach for something. She reports the numbness and tingling starts in the hand and radiates up the arm. She also reports unusual itching about the right thumb as well. She does report a history of significant arthritis of her neck and we discussed that some of her symptoms could be related to this as well.  She recently was seen earlier in the year for left shoulder rotator cuff problems and underwent a steroid injection and reports this did help with her pain on the left side.    Assessment & Plan: Visit Diagnoses:  1. Acute pain of right shoulder   2. Arm pain, diffuse, right     Plan: Right shoulder was injected with lidocaine and Depo-medrol under sterile techniques and patient tolerated this well. Will have patient follow up in 4 weeks.  The patient also desires to follow up with Dr. Ernestina Patches for evaluation of Cervical and lumbar spine, she has imaging at [facility].   Follow-Up Instructions: Return in about 4 weeks (around 05/21/2018).   Ortho Exam  Patient is alert, oriented, no adenopathy, well-dressed, normal affect, normal respiratory effort. The patient has tenderness over the right shoulder over the deltoid and proximal biceps insertion. Some pain  with right shoulder with forward flexion and internal rotation. Good range of motion at the shoulder, elbow and wrist and has good strength through out the right arm. Good pulses of the upper extremities. No signs of infection or cellulitis.   Imaging: Xr Elbow 2 Views Right  Result Date: 04/23/2018 Negative for fracture, negative for other abnormalities.  Xr Shoulder Right  Result Date: 04/23/2018 Negative for acute fracture, no other abnormality  No images are attached to the encounter.  Labs: Lab Results  Component Value Date   HGBA1C 8.2 (A) 02/06/2018   HGBA1C 9.1 09/05/2017   HGBA1C 9.0 05/07/2017   LABURIC 4.1 11/16/2014   REPTSTATUS 11/09/2016 FINAL 11/08/2016   CULT NO GROWTH 11/08/2016   LABORGA STAPHYLOCOCCUS SPECIES (COAGULASE NEGATIVE) 05/24/2015     Lab Results  Component Value Date   ALBUMIN 4.1 02/06/2018   ALBUMIN 4.0 11/06/2017   ALBUMIN 3.9 05/07/2017   LABURIC 4.1 11/16/2014    Body mass index is 32.82 kg/m.  Orders:  Orders Placed This Encounter  Procedures  . XR Shoulder Right  . XR Elbow 2 Views Right   No orders of the defined types were placed in this encounter.    Procedures: Large Joint Inj: R subacromial bursa on 04/23/2018 2:21 PM Indications: diagnostic evaluation and pain Details: 22 G 1.5  in needle, posterior approach  Arthrogram: No  Medications: 5 mL lidocaine 1 %; 40 mg methylPREDNISolone acetate 40 MG/ML Outcome: tolerated well, no immediate complications Procedure, treatment alternatives, risks and benefits explained, specific risks discussed. Consent was given by the patient. Immediately prior to procedure a time out was called to verify the correct patient, procedure, equipment, support staff and site/side marked as required. Patient was prepped and draped in the usual sterile fashion.      Clinical Data: No additional findings.  ROS:  All other systems negative, except as noted in the HPI. Review of  Systems  Objective: Vital Signs: Ht 5' 3"  (1.6 m)   Wt 185 lb 4 oz (84 kg)   BMI 32.82 kg/m   Specialty Comments:  No specialty comments available.  PMFS History: Patient Active Problem List   Diagnosis Date Noted  . Diarrhea 11/08/2016  . Thrombocytopenia ([place]) 11/08/2016  . Degenerative lumbar spinal stenosis 10/27/2015  . Arthralgia 09/22/2015  . Chronic respiratory failure with hypoxia ([place]) 01/11/2015  . Diabetic polyneuropathy associated with type 2 diabetes mellitus ([place]) 11/16/2014  . Essential hypertension, benign 05/19/2013  . Vertigo 05/11/2013  . Dizziness 05/11/2013  . Tachycardia 05/11/2013  . Lower urinary tract infectious disease 05/11/2013  . Sinusitis 05/11/2013  . Other and unspecified hyperlipidemia 02/15/2013  . Type II diabetes mellitus, uncontrolled ([place]) 01/01/2013  . Benign paroxysmal positional vertigo 08/09/2012  . Type II or unspecified type diabetes mellitus with neurological manifestations, uncontrolled(250.62) 08/09/2012  . GLAUCOMA 03/16/2010  . HOARSENESS 03/16/2010  . COUGH 12/23/2009  . CHEST PAIN 12/23/2009  . MUSCULOSKELETAL PAIN 01/28/2009  . COPD mixed type ([place]) 01/15/2009  . Seasonal and perennial allergic rhinitis 11/23/2007  . CHRONIC RHINITIS 07/16/2007  . G E R D 05/29/2007  . I B S-DIARRHEAL PREDOMINATE 05/29/2007  . FATTY LIVER DISEASE 05/29/2007   Past Medical History:  Diagnosis Date  . Allergic rhinitis, cause unspecified    Sinus CT Rec 12-23-2009  . Arthritis   . Benign paroxysmal positional vertigo 08/09/2012  . 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 Complication of anesthesia    takes along time to wake up  . Diabetes mellitus   . Diarrhea   . Diverticulosis   . Esophageal reflux   . Esophageal stricture   . GERD (gastroesophageal reflux disease)   . Glaucoma   . Hiatal hernia   . Hypertension   . Irritable bowel syndrome   . Neuropathy in diabetes  ([place]) 08/09/2012  . Other chronic nonalcoholic liver disease   . Oxygen deficiency   . Sciatica   . Scoliosis   . Shingles 2010  . Shortness of breath   . Steatohepatitis     Family History  Problem Relation Age of Onset  . Heart disease Father   . Lung cancer Mother        small cell;Byssinosis  . Lung cancer Sister   . Liver cancer Sister        ? mets from another area of the body  . Diabetes Unknown        grandmother  . Stroke Maternal Grandfather     Past Surgical History:  Procedure Laterality Date  . APPENDECTOMY    . CARPAL TUNNEL RELEASE     right hand  . CHOLECYSTECTOMY OPEN  1978  . COLONOSCOPY  07-2001   mild diverticulosis  . ESOPHAGOGASTRODUODENOSCOPY  7858,85-02   H Hernia,es.stricture s/p dil 84F  .  INTRAOCULAR LENS INSERTION Bilateral   . LIVER BIOPSY  [phone]  . PARATHYROID EXPLORATION    . TONSILLECTOMY AND ADENOIDECTOMY    . TOTAL ABDOMINAL HYSTERECTOMY    . ULNAR NERVE TRANSPOSITION  12/07/2011   Procedure: ULNAR NERVE DECOMPRESSION/TRANSPOSITION;this was cancelled-not done  Surgeon: Cammie Sickle., MD;  Location: [facility];  Service: Orthopedics;  Laterality: Right;  right ulnar nerve in situ decompression  . ULNAR TUNNEL RELEASE  03/07/2012   Procedure: CUBITAL TUNNEL RELEASE;  Surgeon: Roseanne Kaufman, MD;  Location: [facility];  Service: Orthopedics;  Laterality: Right;  ulnar nerve release at the elbow     Social History   Occupational History  . Occupation: Retired   Tobacco Use  . Smoking status: Former Smoker    Types: Cigarettes    Last attempt to quit: 05/08/1988    Years since quitting: 29.9  . Smokeless tobacco: Never Used  Substance and Sexual Activity  . Alcohol use: No    Alcohol/week: 0.0 standard drinks  . Drug use: No  . Sexual activity: Never

## 2018-04-25 ENCOUNTER — Other Ambulatory Visit: Payer: Self-pay | Admitting: Internal Medicine

## 2018-04-25 DIAGNOSIS — R351 Nocturia: Secondary | ICD-10-CM | POA: Diagnosis not present

## 2018-04-25 DIAGNOSIS — N3941 Urge incontinence: Secondary | ICD-10-CM | POA: Diagnosis not present

## 2018-04-25 DIAGNOSIS — N3020 Other chronic cystitis without hematuria: Secondary | ICD-10-CM | POA: Diagnosis not present

## 2018-04-26 ENCOUNTER — Ambulatory Visit (INDEPENDENT_AMBULATORY_CARE_PROVIDER_SITE_OTHER): Payer: Medicare Other

## 2018-04-26 ENCOUNTER — Encounter (INDEPENDENT_AMBULATORY_CARE_PROVIDER_SITE_OTHER): Payer: Self-pay | Admitting: Physician Assistant

## 2018-04-26 ENCOUNTER — Ambulatory Visit (INDEPENDENT_AMBULATORY_CARE_PROVIDER_SITE_OTHER): Payer: Medicare Other | Admitting: Physician Assistant

## 2018-04-26 VITALS — Ht 63.0 in | Wt 185.0 lb

## 2018-04-26 DIAGNOSIS — H15101 Unspecified episcleritis, right eye: Secondary | ICD-10-CM | POA: Diagnosis not present

## 2018-04-26 DIAGNOSIS — M25512 Pain in left shoulder: Secondary | ICD-10-CM

## 2018-04-26 MED ORDER — LIDOCAINE HCL 1 % IJ SOLN
5.0000 mL | INTRAMUSCULAR | Status: AC | PRN
  Administered 2018-04-26: 5 mL

## 2018-04-26 MED ORDER — METHYLPREDNISOLONE ACETATE 40 MG/ML IJ SUSP
40.0000 mg | INTRAMUSCULAR | Status: AC | PRN
  Administered 2018-04-26: 40 mg via INTRA_ARTICULAR

## 2018-04-26 NOTE — Progress Notes (Signed)
Office Visit Note   Patient: [name]           Date of Birth: 1936/01/09           MRN: [id number] Visit Date: 04/26/2018              Requested by: [name], MD [facility] [place], [place] [address] PCP: [name], MD  Chief Complaint  Patient presents with  . Left Shoulder - Pain      HPI: The patient is a 82 yo woman who comes back in today reporting new onset of left shoulder pain after she was pulling on a shopping cart several days ago. She states she felt something snap in the shoulder and had immediate pain over the left shoulder. She reports it is not as bad today, but is limiting her use of her left arm. She does report improvement in the right shoulder pain following her injection earlier this week and she was concerned the left shoulder would get worse over the weekend.  She reports the right hand is still feeling numb intermittently and we discussed that this may be related to her neck arthritis in addition to the previous problems with right elbow cubital tunnel syndrome with history of release by Dr. Amedeo Plenty in the past.   Assessment & Plan: Visit Diagnoses:  1. Acute pain of left shoulder     Plan: After informed consent, the left shoulder was injected with lidocaine and Depo-medrol under sterile techniques and the patient tolerated this well. She will follow up in about 4 weeks.  Also desires for follow up with Dr. Ernestina Patches for evaluation of cervical and lumbar spine, she has imaging at [facility].   Follow-Up Instructions: Return in about 4 weeks (around 05/24/2018).   Ortho Exam  Patient is alert, oriented, no adenopathy, well-dressed, normal affect, normal respiratory effort. The patient has pain over the left shoulder to palpation over the deltoid and proximal biceps. Mild limitations with motion in forward flexion and internal rotation.  Good pulses in left upper extremity.   Imaging: Xr Shoulder Left  Result Date: 04/26/2018 xrays of  left shoulder without evidence of acute fracture, dislocation or other abnormality.   No images are attached to the encounter.  Labs: Lab Results  Component Value Date   HGBA1C 8.2 (A) 02/06/2018   HGBA1C 9.1 09/05/2017   HGBA1C 9.0 05/07/2017   LABURIC 4.1 11/16/2014   REPTSTATUS 11/09/2016 FINAL 11/08/2016   CULT NO GROWTH 11/08/2016   LABORGA STAPHYLOCOCCUS SPECIES (COAGULASE NEGATIVE) 05/24/2015     Lab Results  Component Value Date   ALBUMIN 4.1 02/06/2018   ALBUMIN 4.0 11/06/2017   ALBUMIN 3.9 05/07/2017   LABURIC 4.1 11/16/2014    Body mass index is 32.77 kg/m.  Orders:  Orders Placed This Encounter  Procedures  . Large Joint Inj  . XR Shoulder Left   No orders of the defined types were placed in this encounter.    Procedures: Large Joint Inj: L subacromial bursa on 04/26/2018 3:22 PM Indications: diagnostic evaluation and pain Details: 22 G 1.5 in needle, posterior approach  Arthrogram: No  Medications: 5 mL lidocaine 1 %; 40 mg methylPREDNISolone acetate 40 MG/ML Outcome: tolerated well, no immediate complications Procedure, treatment alternatives, risks and benefits explained, specific risks discussed. Consent was given by the patient. Immediately prior to procedure a time out was called to verify the correct patient, procedure, equipment, support staff and site/side marked as required. Patient was prepped  and draped in the usual sterile fashion.      Clinical Data: No additional findings.  ROS:  All other systems negative, except as noted in the HPI. Review of Systems  Objective: Vital Signs: Ht 5' 3"  (1.6 m)   Wt 185 lb (83.9 kg)   BMI 32.77 kg/m   Specialty Comments:  No specialty comments available.  PMFS History: Patient Active Problem List   Diagnosis Date Noted  . Diarrhea 11/08/2016  . Thrombocytopenia ([place]) 11/08/2016  . Degenerative lumbar spinal stenosis 10/27/2015  . Arthralgia 09/22/2015  . Chronic respiratory failure  with hypoxia ([place]) 01/11/2015  . Diabetic polyneuropathy associated with type 2 diabetes mellitus ([place]) 11/16/2014  . Essential hypertension, benign 05/19/2013  . Vertigo 05/11/2013  . Dizziness 05/11/2013  . Tachycardia 05/11/2013  . Lower urinary tract infectious disease 05/11/2013  . Sinusitis 05/11/2013  . Other and unspecified hyperlipidemia 02/15/2013  . Type II diabetes mellitus, uncontrolled ([place]) 01/01/2013  . Benign paroxysmal positional vertigo 08/09/2012  . Type II or unspecified type diabetes mellitus with neurological manifestations, uncontrolled(250.62) 08/09/2012  . GLAUCOMA 03/16/2010  . HOARSENESS 03/16/2010  . COUGH 12/23/2009  . CHEST PAIN 12/23/2009  . MUSCULOSKELETAL PAIN 01/28/2009  . COPD mixed type ([place]) 01/15/2009  . Seasonal and perennial allergic rhinitis 11/23/2007  . CHRONIC RHINITIS 07/16/2007  . G E R D 05/29/2007  . I B S-DIARRHEAL PREDOMINATE 05/29/2007  . FATTY LIVER DISEASE 05/29/2007   Past Medical History:  Diagnosis Date  . Allergic rhinitis, cause unspecified    Sinus CT Rec 12-23-2009  . Arthritis   . Benign paroxysmal positional vertigo 08/09/2012  . 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 Complication of anesthesia    takes along time to wake up  . Diabetes mellitus   . Diarrhea   . Diverticulosis   . Esophageal reflux   . Esophageal stricture   . GERD (gastroesophageal reflux disease)   . Glaucoma   . Hiatal hernia   . Hypertension   . Irritable bowel syndrome   . Neuropathy in diabetes ([place]) 08/09/2012  . Other chronic nonalcoholic liver disease   . Oxygen deficiency   . Sciatica   . Scoliosis   . Shingles 2010  . Shortness of breath   . Steatohepatitis     Family History  Problem Relation Age of Onset  . Heart disease Father   . Lung cancer Mother        small cell;Byssinosis  . Lung cancer Sister   . Liver cancer Sister        ? mets from another area of the  body  . Diabetes Unknown        grandmother  . Stroke Maternal Grandfather     Past Surgical History:  Procedure Laterality Date  . APPENDECTOMY    . CARPAL TUNNEL RELEASE     right hand  . CHOLECYSTECTOMY OPEN  1978  . COLONOSCOPY  07-2001   mild diverticulosis  . ESOPHAGOGASTRODUODENOSCOPY  1308,65-78   H Hernia,es.stricture s/p dil 53F  . INTRAOCULAR LENS INSERTION Bilateral   . LIVER BIOPSY  [phone]  . PARATHYROID EXPLORATION    . TONSILLECTOMY AND ADENOIDECTOMY    . TOTAL ABDOMINAL HYSTERECTOMY    . ULNAR NERVE TRANSPOSITION  12/07/2011   Procedure: ULNAR NERVE DECOMPRESSION/TRANSPOSITION;this was cancelled-not done  Surgeon: Cammie Sickle., MD;  Location: [facility];  Service: Orthopedics;  Laterality: Right;  right ulnar nerve  in situ decompression  . ULNAR TUNNEL RELEASE  03/07/2012   Procedure: CUBITAL TUNNEL RELEASE;  Surgeon: Roseanne Kaufman, MD;  Location: [facility];  Service: Orthopedics;  Laterality: Right;  ulnar nerve release at the elbow     Social History   Occupational History  . Occupation: Retired   Tobacco Use  . Smoking status: Former Smoker    Types: Cigarettes    Last attempt to quit: 05/08/1988    Years since quitting: 29.9  . Smokeless tobacco: Never Used  Substance and Sexual Activity  . Alcohol use: No    Alcohol/week: 0.0 standard drinks  . Drug use: No  . Sexual activity: Never

## 2018-04-29 ENCOUNTER — Telehealth (INDEPENDENT_AMBULATORY_CARE_PROVIDER_SITE_OTHER): Payer: Self-pay | Admitting: *Deleted

## 2018-04-29 ENCOUNTER — Telehealth: Payer: Self-pay | Admitting: Internal Medicine

## 2018-04-29 NOTE — Telephone Encounter (Signed)
Spoke with pt. She is aware of CY's recommendations. Nothing further was needed at this time.

## 2018-04-29 NOTE — Telephone Encounter (Addendum)
Pt is scheduled for 05/28/18  ----- Message from [place], PA-C sent at 04/26/2018  3:39 PM EST ----- This patient wants to know if Dr. Ernestina Patches will see her for her cervical and lumbar spine . She has imaging at [facility]. Thanks, [employer]

## 2018-04-29 NOTE — Telephone Encounter (Signed)
Called and spoke with pt who stated she has been having a nosebleed x3 days. Pt states when she blew her nose, she would notice the blood. Pt states the bleeding did stop but then it came back again last night, it became worse. Pt stated there has been clots as well when she blew her nose.  Pt stated the blood at first was a bright red blood but now it is not.  Pt wants recs to see what needs to be done to help get the bleeding to stop.  Dr. Annamaria Boots, please advise recs for pt. Thanks!

## 2018-04-29 NOTE — Telephone Encounter (Signed)
Skip aspirin x 2 days, then resume  Use otc nasal saline gel in nostrils to prevent over-drying in your nose

## 2018-05-08 NOTE — Progress Notes (Signed)
Patient ID: [name], female   DOB: June 19, 1935, 83 y.o.   MRN: [id number]   Reason for Appointment: Diabetes and follow-up of multiple problems  History of Present Illness    PROBLEM 1:  DIABETES type II since 1980   She has been on insulin using Lantus and Humalog for several years and usually A1c around 8% despite fairly good compliance with diet, exercise and glucose monitoring. She also had been on Actos previously which probably benefited her especially with her fatty liver but she stopped it because of fear of side effects   She was changed from NovoLog to U-500 insulin starting in 02/2016; she did not related was controlling her sugars and preferred to go back to NovoLog.    Recent history:   Insulin regimen: Tresiba  56-60 units in a.m.  NOVOLOG before meals 18-18-22 units before meals  Her A1c has been previously consistently around 9- 10% and now at 8.8   She has had periodic steroid injections for various musculoskeletal problems and resulting high sugars intermittently  However most of her high readings usually are late in the evenings but as before her blood sugars fluctuate  She said that she had an episode of low sugar of 53 before lunch but this is not documented on her meter  Also today in the office while living late morning her blood sugar was in the 60s and she felt weak  This is despite her usually getting a meat protein or an egg with breakfast  FASTING readings again blood sugars but recently have been just above 100 frequently  Sugars are averaging over 200 late evening  Some of her high readings around suppertime may be from her missing her lunchtime NovoLog because of not being home for lunch  She does not adjust her insulin even if she is seeing her blood sugars to be persistently high after dinner and also when she is having high readings with steroids  Because of low normal sugars at times she will sometimes take 56 units of  Tresiba otherwise 60   Oral hypoglycemic drugs: none       Side effects from medications: None         Proper timing of medications in relation to meals: Yes.          Monitors blood glucose:  About 3-4 times a day   Glucometer:  Accu-Chek         Blood Glucose averages recently from meter download:   PRE-MEAL Fasting Lunch Dinner Bedtime Overall  Glucose range:  92-270  74-253   138-386   Mean/median:  150  141  227  232  169+/-63   Previous readings:  PRE-MEAL Fasting Lunch Dinner Bedtime Overall  Glucose range:  103-206  118-194  130-281  65-368   Mean/median:  132    1 9  165 +/-55       Meals: 3 meals per day.  Breakfast 7 AM ; lunch 1-2 pm Supper 6 to 7 PM     Physical activity: exercise: Unable to do any because of dyspnea and fatigue            Dietician visit: Most recent: Several years ago            Complications: are: Peripheral neuropathy with sensory loss     Wt Readings from Last 3 Encounters:  05/09/18 185 lb 9.6 oz (84.2 kg)  04/26/18 185 lb (83.9 kg)  04/23/18 185 lb 4 oz (84  kg)    Lab Results  Component Value Date   HGBA1C 8.8 (A) 05/09/2018   HGBA1C 8.2 (A) 02/06/2018   HGBA1C 9.1 09/05/2017   Lab Results  Component Value Date   MICROALBUR 1.1 05/11/2017   LDLCALC 75 11/06/2017   CREATININE 0.76 02/06/2018     OTHER Current problems  are detailed in review of systems   Allergies as of 05/09/2018      Reactions   Chlordiazepoxide-clidinium Other (See Comments)   Sleepy,weak   Biaxin [clarithromycin] Other (See Comments)   Foul taste, abd pain, diarrhea   Tramadol Other (See Comments)   Caused tremors   Codeine Other (See Comments)   REACTION: gi upset- only in high doses per pt.  Can tolerate cough syrup with codeine.       Medication List       Accurate as of May 09, 2018 11:04 AM. Always use your most recent med list.        accu-chek multiclix lancets Use to check blood sugar 8 times per day. Dx code E11.9     acetaminophen 500 MG tablet Commonly known as:  TYLENOL Take 1,000 mg by mouth every 6 (six) hours as needed for headache (pain).   AEROCHAMBER MV inhaler Use as instructed   ALPRAZolam 0.5 MG tablet Commonly known as:  XANAX Half-1 tablet twice a day as needed for anxiety   aspirin 325 MG tablet Take 325 mg by mouth daily.   cholestyramine 4 g packet Commonly known as:  QUESTRAN 1 scoop/pack twice a day as needed.   dicyclomine 10 MG capsule Commonly known as:  BENTYL Take 1 capsule (10 mg total) by mouth 2 (two) times daily.   glucose blood test strip Commonly known as:  ACCU-CHEK AVIVA PLUS Use as instructed to check blood sugars 8 times per day dx code E11.65   Insulin Degludec 200 UNIT/ML Sopn Commonly known as:  TRESIBA FLEXTOUCH Inject 60 Units into the skin daily.   Insulin Pen Needle 31G X 8 MM Misc Commonly known as:  B-D ULTRAFINE III SHORT PEN TEST 3 TIMES A DAY   B-D UF III MINI PEN NEEDLES 31G X 5 MM Misc Generic drug:  Insulin Pen Needle USE 5 PER DAY TO INJECT INSULIN   Insulin Syringe-Needle U-100 31G X 15/64" 0.5 ML Misc Commonly known as:  BD INSULIN SYRINGE ULTRAFINE Use 2 per day dx code E11.65   ipratropium-albuterol 0.5-2.5 (3) MG/3ML Soln Commonly known as:  DUONEB 1 vial in neb every 8 hours and as needed   irbesartan 75 MG tablet Commonly known as:  AVAPRO Take 1 tablet (75 mg total) by mouth daily.   magic mouthwash Soln Swish with 2 teaspoonfuls by mouth as needed   meclizine 25 MG tablet Commonly known as:  ANTIVERT take 1 tablet by mouth three times a day if needed for dizziness/vertigo   methenamine 0.5 GM tablet Commonly known as:  MANDELAMINE   metoprolol succinate 25 MG 24 hr tablet Commonly known as:  TOPROL-XL Take 1 tablet (25 mg total) by mouth daily.   NOVOLOG FLEXPEN 100 UNIT/ML FlexPen Generic drug:  insulin aspart INJECT 18 UNITS EVERY MORNING WITH BREAKFAST, 22 UNITS AT LUNCH AND 24 UNITS AT SUPPER.    omeprazole 20 MG capsule Commonly known as:  PRILOSEC Take 1 capsule (20 mg total) by mouth daily.   OXYGEN Inhale 3 L into the lungs continuous.   pravastatin 20 MG tablet Commonly known as:  PRAVACHOL Take 1  tablet (20 mg total) by mouth daily.   predniSONE 1 MG tablet Commonly known as:  DELTASONE 3 tab x 3 days then 2 tab every other day   promethazine 25 MG tablet Commonly known as:  PHENERGAN Take 1 tablet (25 mg total) by mouth every 6 (six) hours as needed for nausea or vomiting.   sodium chloride HYPERTONIC 3 % nebulizer solution 1 ampule neb every 6 hours if needed to clear mucus   Tiotropium Bromide-Olodaterol 2.5-2.5 MCG/ACT Aers Commonly known as:  STIOLTO RESPIMAT Inhale 2 puffs into the lungs daily.   trimethoprim 100 MG tablet Commonly known as:  TRIMPEX   Vitamin D (Ergocalciferol) 1.25 MG (50000 UT) Caps capsule Commonly known as:  DRISDOL Take 1 capsule (50,000 Units total) by mouth every 7 (seven) days.   XOPENEX HFA 45 MCG/ACT inhaler Generic drug:  levalbuterol INHALE 2 PUFFS EVERY 6 HOURS IF NEEDED FOR WHEEZING OR SHORTNESS OF BREATH.       Allergies:  Allergies  Allergen Reactions  . Chlordiazepoxide-Clidinium Other (See Comments)    Sleepy,weak  . Biaxin [Clarithromycin] Other (See Comments)    Foul taste, abd pain, diarrhea  . Tramadol Other (See Comments)    Caused tremors  . Codeine Other (See Comments)    REACTION: gi upset- only in high doses per pt.  Can tolerate cough syrup with codeine.     Past Medical History:  Diagnosis Date  . Allergic rhinitis, cause unspecified    Sinus CT Rec 12-23-2009  . Arthritis   . Benign paroxysmal positional vertigo 08/09/2012  . 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 Complication of anesthesia    takes along time to wake up  . Diabetes mellitus   . Diarrhea   . Diverticulosis   . Esophageal reflux   . Esophageal stricture   . GERD  (gastroesophageal reflux disease)   . Glaucoma   . Hiatal hernia   . Hypertension   . Irritable bowel syndrome   . Neuropathy in diabetes ([facility]) 08/09/2012  . Other chronic nonalcoholic liver disease   . Oxygen deficiency   . Sciatica   . Scoliosis   . Shingles 2010  . Shortness of breath   . Steatohepatitis     Past Surgical History:  Procedure Laterality Date  . APPENDECTOMY    . CARPAL TUNNEL RELEASE     right hand  . CHOLECYSTECTOMY OPEN  1978  . COLONOSCOPY  07-2001   mild diverticulosis  . ESOPHAGOGASTRODUODENOSCOPY  [id number],[id number]   H Hernia,es.stricture s/p dil 78F  . INTRAOCULAR LENS INSERTION Bilateral   . LIVER BIOPSY  [phone]  . PARATHYROID EXPLORATION    . TONSILLECTOMY AND ADENOIDECTOMY    . TOTAL ABDOMINAL HYSTERECTOMY    . ULNAR NERVE TRANSPOSITION  12/07/2011   Procedure: ULNAR NERVE DECOMPRESSION/TRANSPOSITION;this was cancelled-not done  Surgeon: Cammie Sickle., MD;  Location: [facility];  Service: Orthopedics;  Laterality: Right;  right ulnar nerve in situ decompression  . ULNAR TUNNEL RELEASE  03/07/2012   Procedure: CUBITAL TUNNEL RELEASE;  Surgeon: Roseanne Kaufman, MD;  Location: [facility];  Service: Orthopedics;  Laterality: Right;  ulnar nerve release at the elbow      Family History  Problem Relation Age of Onset  . Heart disease Father   . Lung cancer Mother        small cell;Byssinosis  . Lung cancer Sister   . Liver cancer Sister        ?  mets from another area of the body  . Diabetes Unknown        grandmother  . Stroke Maternal Grandfather     Social History:  reports that she quit smoking about 30 years ago. Her smoking use included cigarettes. She has never used smokeless tobacco. She reports that she does not drink alcohol or use drugs.  Review of Systems:  DIARRHEA: She was recommended taking Colestid tablets as needed for her diarrhea However she still tries to take the Questran powder which she has  difficulty with taking because of poor taste but she still tries to do this However her diarrhea is intermittent now and occurring every 4 to 5 days She is trying to use Lactaid milk Gastroenterologist told her that her diagnosis is still irritable bowel syndrome  HYPERTENSION:   blood pressure is controlled with adding 75 mg of irbesartan although fluctuating somewhat   BP Readings from Last 3 Encounters:  05/09/18 (!) 125/55  02/25/18 (!) 162/64  02/06/18 130/62    She takes Xanax at night mostly and lorazepam as needed for anxiety  HYPERLIPIDEMIA:  She has had mixed hyperlipidemia, LDL treated with pravastatin 20 mg Last LDL adequately controlled     Lab Results  Component Value Date   CHOL 148 11/06/2017   HDL 52.30 11/06/2017   LDLCALC 75 11/06/2017   LDLDIRECT 89.0 10/14/2014   TRIG 104.0 11/06/2017   CHOLHDL 3 11/06/2017    She is followed by pulmonologist for chronic COPD, using home oxygen Also takes periodic prednisone   She has numerous musculoskeletal issues recently including shoulder pain, right arm tingling and is also seeing multiple orthopedic specialists  She has had persistent joint pains and back pain  She did not continue her vitamin D level was previously prescribed  Lab Results  Component Value Date   VD25OH 19.95 (L) 02/06/2018      Examination:   BP (!) 125/55   Pulse 91   Ht 5' 3"  (1.6 m)   Wt 185 lb 9.6 oz (84.2 kg)   SpO2 92%   BMI 32.88 kg/m   Body mass index is 32.88 kg/m.     ASSESSMENT/ PLAN:    Diabetes type 2 with obesity, insulin-dependent  See history of present illness for detailed discussion of  current management, blood sugar patterns and problems identified  Her A1c is relatively higher at 8.8 But sugars are not affected by her variable routine, sometimes missing her insulin at lunch, not adjusting her basal and bolus insulin doses appropriately and not taking enough insulin to cover her evening meal  usually   Recommendations: She will stay with a consistent dose of 56 Tresiba Make sure she takes her NovoLog with her when she is eating out Reduce morning NovoLog by 2 units Increase suppertime dose by 4 units More consistent monitoring at suppertime afterwards  DIARRHEA: Encouraged her to stay with Questran as needed and once she finishes this she can go back to Colestid   HYPERTENSION: Controlled with 75 mg of Avapro and will continue, blood pressure was initially high when she came in  Low back pain: She will continue to work with her neurosurgeon for pain management  Previously diagnosed vitamin D deficiency, discussed need to continually take her treatment which she is not doing currently and only took the prescription for 1 month, new prescription sent  LIPIDS: Well controlled previously  Preventive care: She needs to be seen by a PCP but she said that she does not  want to do so as she does not want a general physical exam or another doctor to see Given her list of PCPs who are accepting new patients  Counseling time on subjects discussed in assessment and plan sections is over 50% of today's 25 minute visit    Patient Instructions  56 TRESIBA   Novolog 4 more at supper  Check blood sugars on waking up days a week  Also check blood sugars about 2 hours after meals and do this after different meals by rotation  Recommended blood sugar levels on waking up are 90-130 and about 2 hours after meal is 130-180  Please bring your blood sugar monitor to each visit, thank you       [name] 05/09/2018, 11:04 AM

## 2018-05-09 ENCOUNTER — Other Ambulatory Visit: Payer: Self-pay

## 2018-05-09 ENCOUNTER — Telehealth: Payer: Self-pay

## 2018-05-09 ENCOUNTER — Encounter: Payer: Self-pay | Admitting: Endocrinology

## 2018-05-09 ENCOUNTER — Ambulatory Visit (INDEPENDENT_AMBULATORY_CARE_PROVIDER_SITE_OTHER): Payer: Medicare Other | Admitting: Endocrinology

## 2018-05-09 VITALS — BP 125/55 | HR 91 | Ht 63.0 in | Wt 185.6 lb

## 2018-05-09 DIAGNOSIS — E559 Vitamin D deficiency, unspecified: Secondary | ICD-10-CM

## 2018-05-09 DIAGNOSIS — E1165 Type 2 diabetes mellitus with hyperglycemia: Secondary | ICD-10-CM

## 2018-05-09 DIAGNOSIS — Z794 Long term (current) use of insulin: Secondary | ICD-10-CM | POA: Diagnosis not present

## 2018-05-09 DIAGNOSIS — I10 Essential (primary) hypertension: Secondary | ICD-10-CM | POA: Diagnosis not present

## 2018-05-09 DIAGNOSIS — K580 Irritable bowel syndrome with diarrhea: Secondary | ICD-10-CM | POA: Diagnosis not present

## 2018-05-09 LAB — POCT GLYCOSYLATED HEMOGLOBIN (HGB A1C): Hemoglobin A1C: 8.8 % — AB (ref 4.0–5.6)

## 2018-05-09 MED ORDER — INSULIN ASPART 100 UNIT/ML FLEXPEN: PEN_INJECTOR | SUBCUTANEOUS | 0 refills | Status: DC

## 2018-05-09 MED ORDER — ACCU-CHEK SOFTCLIX LANCETS MISC: 1.0000 | Freq: Every day | 3 refills | Status: DC

## 2018-05-09 MED ORDER — VITAMIN D (ERGOCALCIFEROL) 1.25 MG (50000 UNIT) PO CAPS: 50000.0000 [IU] | ORAL_CAPSULE | ORAL | 3 refills | Status: DC

## 2018-05-09 MED ORDER — ACCU-CHEK SOFTCLIX LANCET DEV KIT: 1.0000 | PACK | Freq: Every day | 0 refills | Status: DC

## 2018-05-09 NOTE — Telephone Encounter (Signed)
At 1203, pt blood sugar was checked again and noted to be 114. Pt was instructed that she can now leave the office, but she needs to go home and eat a well balanced meal. Pt verbalized understanding.

## 2018-05-09 NOTE — Telephone Encounter (Signed)
Pt was seen in office today for diabetes follow up. Upon checking out, pt stated that she felt dizzy. Blood sugar was checked and noted to be 63, and checked a second time to confirm. Pt was taken to exam room and sat down, given crackers and peanut butter as well as something to drink.  Pt advised to stay seated and do not attempt to get up and do not leave without this nurse first verifying that blood sugar is stable. Pt verbalized understanding. Pt's personal nursing aide at her side as of this time.  Will follow up and recheck sugar.

## 2018-05-09 NOTE — Patient Instructions (Addendum)
56 TRESIBA   Novolog 4 more at supper  Check blood sugars on waking up days a week  Also check blood sugars about 2 hours after meals and do this after different meals by rotation  Recommended blood sugar levels on waking up are 90-130 and about 2 hours after meal is 130-180  Please bring your blood sugar monitor to each visit, thank you

## 2018-05-10 ENCOUNTER — Telehealth: Payer: Self-pay

## 2018-05-10 NOTE — Telephone Encounter (Signed)
error 

## 2018-05-15 DIAGNOSIS — H15101 Unspecified episcleritis, right eye: Secondary | ICD-10-CM | POA: Diagnosis not present

## 2018-05-21 ENCOUNTER — Encounter (INDEPENDENT_AMBULATORY_CARE_PROVIDER_SITE_OTHER): Payer: Self-pay | Admitting: Orthopedic Surgery

## 2018-05-21 ENCOUNTER — Ambulatory Visit (INDEPENDENT_AMBULATORY_CARE_PROVIDER_SITE_OTHER): Payer: Medicare Other | Admitting: Orthopedic Surgery

## 2018-05-21 VITALS — Ht 63.0 in | Wt 185.6 lb

## 2018-05-21 DIAGNOSIS — M545 Low back pain, unspecified: Secondary | ICD-10-CM

## 2018-05-21 DIAGNOSIS — M25512 Pain in left shoulder: Secondary | ICD-10-CM | POA: Diagnosis not present

## 2018-05-21 DIAGNOSIS — G8929 Other chronic pain: Secondary | ICD-10-CM

## 2018-05-21 NOTE — Progress Notes (Signed)
Office Visit Note   Patient: [name]           Date of Birth: 1935/05/24           MRN: [id number] Visit Date: 05/21/2018              Requested by: [name], MD [address] [place], [place] [address] PCP: [name], MD  Chief Complaint  Patient presents with  . Left Shoulder - Pain, Follow-up      HPI: Patient is an 83 year old woman who presents she is status post subacromial injections for both shoulders.  She states that the initial injection in her left shoulder completely relieve the pain then she had an incident with her shopping cart and had a second acute injury to her left shoulder she states the second injection has helped but not as good as the first injection.  Patient states she still has lower back pain and has appointment with Dr. Ernestina Patches next week.  Assessment & Plan: Visit Diagnoses:  1. Acute pain of left shoulder   2. Chronic midline low back pain without sciatica     Plan: Patient will follow-up with Dr. Ernestina Patches for evaluation for epidural steroid injection she will follow-up with [place] as needed if her shoulder symptoms recur.  Follow-Up Instructions: Return if symptoms worsen or fail to improve.   Ortho Exam  Patient is alert, oriented, no adenopathy, well-dressed, normal affect, normal respiratory effort. Examination patient has difficulty getting from sitting to a standing position she uses a cane she has nasal cannula FiO2.  Examination she has good range of motion of both shoulders without symptoms with range of motion.  She has no focal motor weakness in either lower extremity.  Imaging: No results found. No images are attached to the encounter.  Labs: Lab Results  Component Value Date   HGBA1C 8.8 (A) 05/09/2018   HGBA1C 8.2 (A) 02/06/2018   HGBA1C 9.1 09/05/2017   LABURIC 4.1 11/16/2014   REPTSTATUS 11/09/2016 FINAL 11/08/2016   CULT NO GROWTH 11/08/2016   LABORGA STAPHYLOCOCCUS SPECIES (COAGULASE NEGATIVE) 05/24/2015       Lab Results  Component Value Date   ALBUMIN 4.1 02/06/2018   ALBUMIN 4.0 11/06/2017   ALBUMIN 3.9 05/07/2017   LABURIC 4.1 11/16/2014    Body mass index is 32.88 kg/m.  Orders:  No orders of the defined types were placed in this encounter.  No orders of the defined types were placed in this encounter.    Procedures: No procedures performed  Clinical Data: No additional findings.  ROS:  All other systems negative, except as noted in the HPI. Review of Systems  Objective: Vital Signs: Ht 5' 3"  (1.6 m)   Wt 185 lb 9.6 oz (84.2 kg)   BMI 32.88 kg/m   Specialty Comments:  No specialty comments available.  PMFS History: Patient Active Problem List   Diagnosis Date Noted  . Diarrhea 11/08/2016  . Thrombocytopenia ([facility]) 11/08/2016  . Degenerative lumbar spinal stenosis 10/27/2015  . Arthralgia 09/22/2015  . Chronic respiratory failure with hypoxia ([facility]) 01/11/2015  . Diabetic polyneuropathy associated with type 2 diabetes mellitus ([facility]) 11/16/2014  . Essential hypertension, benign 05/19/2013  . Vertigo 05/11/2013  . Dizziness 05/11/2013  . Tachycardia 05/11/2013  . Lower urinary tract infectious disease 05/11/2013  . Sinusitis 05/11/2013  . Other and unspecified hyperlipidemia 02/15/2013  . Type II diabetes mellitus, uncontrolled ([facility]) 01/01/2013  . Benign paroxysmal positional vertigo 08/09/2012  . Type II  or unspecified type diabetes mellitus with neurological manifestations, uncontrolled(250.62) 08/09/2012  . GLAUCOMA 03/16/2010  . HOARSENESS 03/16/2010  . COUGH 12/23/2009  . CHEST PAIN 12/23/2009  . MUSCULOSKELETAL PAIN 01/28/2009  . COPD mixed type ([facility]) 01/15/2009  . Seasonal and perennial allergic rhinitis 11/23/2007  . CHRONIC RHINITIS 07/16/2007  . G E R D 05/29/2007  . I B S-DIARRHEAL PREDOMINATE 05/29/2007  . FATTY LIVER DISEASE 05/29/2007   Past Medical History:  Diagnosis Date  . Allergic rhinitis, cause unspecified    Sinus CT Rec  12-23-2009  . Arthritis   . Benign paroxysmal positional vertigo 08/09/2012  . 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 Complication of anesthesia    takes along time to wake up  . Diabetes mellitus   . Diarrhea   . Diverticulosis   . Esophageal reflux   . Esophageal stricture   . GERD (gastroesophageal reflux disease)   . Glaucoma   . Hiatal hernia   . Hypertension   . Irritable bowel syndrome   . Neuropathy in diabetes ([facility]) 08/09/2012  . Other chronic nonalcoholic liver disease   . Oxygen deficiency   . Sciatica   . Scoliosis   . Shingles 2010  . Shortness of breath   . Steatohepatitis     Family History  Problem Relation Age of Onset  . Heart disease Father   . Lung cancer Mother        small cell;Byssinosis  . Lung cancer Sister   . Liver cancer Sister        ? mets from another area of the body  . Diabetes Unknown        grandmother  . Stroke Maternal Grandfather     Past Surgical History:  Procedure Laterality Date  . APPENDECTOMY    . CARPAL TUNNEL RELEASE     right hand  . CHOLECYSTECTOMY OPEN  1978  . COLONOSCOPY  07-2001   mild diverticulosis  . ESOPHAGOGASTRODUODENOSCOPY  3383,29-19   H Hernia,es.stricture s/p dil 44F  . INTRAOCULAR LENS INSERTION Bilateral   . LIVER BIOPSY  [phone]  . PARATHYROID EXPLORATION    . TONSILLECTOMY AND ADENOIDECTOMY    . TOTAL ABDOMINAL HYSTERECTOMY    . ULNAR NERVE TRANSPOSITION  12/07/2011   Procedure: ULNAR NERVE DECOMPRESSION/TRANSPOSITION;this was cancelled-not done  Surgeon: Cammie Sickle., MD;  Location: [facility];  Service: Orthopedics;  Laterality: Right;  right ulnar nerve in situ decompression  . ULNAR TUNNEL RELEASE  03/07/2012   Procedure: CUBITAL TUNNEL RELEASE;  Surgeon: Roseanne Kaufman, MD;  Location: [facility];  Service: Orthopedics;  Laterality: Right;  ulnar nerve release at the elbow     Social History    Occupational History  . Occupation: Retired   Tobacco Use  . Smoking status: Former Smoker    Types: Cigarettes    Last attempt to quit: 05/08/1988    Years since quitting: 30.0  . Smokeless tobacco: Never Used  Substance and Sexual Activity  . Alcohol use: No    Alcohol/week: 0.0 standard drinks  . Drug use: No  . Sexual activity: Never

## 2018-05-28 ENCOUNTER — Ambulatory Visit (INDEPENDENT_AMBULATORY_CARE_PROVIDER_SITE_OTHER): Payer: Self-pay | Admitting: Physical Medicine and Rehabilitation

## 2018-05-28 ENCOUNTER — Telehealth: Payer: Self-pay | Admitting: Endocrinology

## 2018-05-28 ENCOUNTER — Telehealth: Payer: Self-pay | Admitting: Internal Medicine

## 2018-05-28 NOTE — Telephone Encounter (Signed)
Called pt and gave her MD message. Pt verbalized understanding

## 2018-05-28 NOTE — Telephone Encounter (Signed)
She can be seen at urgent care center.  She needs to call the physicians on the list I gave her for PCPs and these doctors are taking new patients

## 2018-05-28 NOTE — Telephone Encounter (Signed)
Pt called and stated that her cat bit her on the upper arm about a week ago, and now she has "small knots in her veins." Pt stated that she googled it, and now she is worried she has blood clots.  Pt wanted to know if Dr. Dwyane Dee could tell her what to do.  I asked pt if she has scheduled an appt with a PCP and pt stated that no PCP will accept her, so she knows Dr. Dwyane Dee will take care of her.

## 2018-05-28 NOTE — Telephone Encounter (Signed)
Patient wanted to speak with [name] stated she would speak with him regarding her call

## 2018-05-28 NOTE — Telephone Encounter (Signed)
Spoke with the pt  She is c/o cough ever since her last ov April 2019  She has been waking up in the night due to cough  Offered ov with NP for today and she refused  She states will only see CD[facility]  I have scheduled her an appointment with him for 05/31/18 at 9:30 and I gave her the new office location

## 2018-05-31 ENCOUNTER — Encounter: Payer: Self-pay | Admitting: Internal Medicine

## 2018-05-31 ENCOUNTER — Ambulatory Visit (INDEPENDENT_AMBULATORY_CARE_PROVIDER_SITE_OTHER)
Admission: RE | Admit: 2018-05-31 | Discharge: 2018-05-31 | Disposition: A | Discharge status: Home / Self Care | Payer: Medicare Other | Source: Ambulatory Visit | Attending: Internal Medicine | Admitting: Internal Medicine

## 2018-05-31 ENCOUNTER — Ambulatory Visit (INDEPENDENT_AMBULATORY_CARE_PROVIDER_SITE_OTHER): Payer: Medicare Other | Admitting: Internal Medicine

## 2018-05-31 VITALS — BP 148/68 | HR 91 | Ht 63.0 in | Wt 186.0 lb

## 2018-05-31 DIAGNOSIS — J9611 Chronic respiratory failure with hypoxia: Secondary | ICD-10-CM

## 2018-05-31 DIAGNOSIS — R05 Cough: Secondary | ICD-10-CM | POA: Diagnosis not present

## 2018-05-31 DIAGNOSIS — J449 Chronic obstructive pulmonary disease, unspecified: Secondary | ICD-10-CM

## 2018-05-31 DIAGNOSIS — R059 Cough, unspecified: Secondary | ICD-10-CM

## 2018-05-31 MED ORDER — BUDESONIDE-FORMOTEROL FUMARATE 160-4.5 MCG/ACT IN AERO: INHALATION_SPRAY | RESPIRATORY_TRACT | 6 refills | Status: DC

## 2018-05-31 MED ORDER — BUDESONIDE-FORMOTEROL FUMARATE 160-4.5 MCG/ACT IN AERO: 2.0000 | INHALATION_SPRAY | Freq: Two times a day (BID) | RESPIRATORY_TRACT | 0 refills | Status: DC

## 2018-05-31 NOTE — Assessment & Plan Note (Addendum)
Continues oxygen at night and as needed during the day.  Activity is limited.

## 2018-05-31 NOTE — Patient Instructions (Signed)
Sample and print script for Symbicort 160   Inhale 2 puffs before breakfast and before supper. Eating will help clear it from your mouth.  Try this instead of Stiolto  Order- CXR   Dx COPD mixed type   Please  Call if we can help

## 2018-05-31 NOTE — Assessment & Plan Note (Signed)
We are going to try again with Symbicort as a steroid inhaler is that of Stiolto.

## 2018-05-31 NOTE — Progress Notes (Signed)
Patient ID: [name], female    DOB: [birth year], 83 y.o.   MRN: [id number]  HPI Female former smoker followed for COPD, chronic hypoxic respiratory failure, complicated by chronic rhinitis, GERD, DM, glaucoma 2012- Walk test Patient Saturations on Room Air while Ambulating = 84% Patient Saturations on 2.5 Liters of oxygen while Ambulating = 92%  PFT: 01/16/2012 severe obstructive airways disease with insignificant response to bronchodilator, air trapping, diffusion moderately reduced. FEV1 0.82/45%, FEV1/FEC 0.46. Emphysema pattern on the loop. TLC 100%, RV 148%, DLCO 47%. --------------------------------------------------------------------- 08/10/17 83 year old female former smoker followed for COPD, chronic hypoxic respiratory failure, complicated by chronic rhinitis, GERD, DM2,  back pain/scoliosis O2 3-3.5L/ Advanced Xopenex HFA, Stiolto Respimat, ----copd-cough, mucus feels stuck , increased in chest tightness just below the right breast She has an aide now and although she complained of significant respiratory limitation, chronic cough and chronic back pain related to her scoliosis, she really was cheerful and talkative.  Amoxicillin.  Feels unable to clear secretions.  We have used a flutter device before. Dependent on oxygen. CXR 05/17/17  No active cardiopulmonary disease.  05/31/2018- 83 year old female former smoker followed for COPD, chronic hypoxic respiratory failure, complicated by chronic rhinitis, GERD, DM2,  back pain/scoliosis O2 3-3.5L/ Advanced Xopenex HFA, Stiolto Respimat, -----Cough: Pt states she has had cough x years but feels cough has gotten worse and nothing is helping. Prednisone works well, but she is diabetic. Labs 11/06/2017-eos 0.4 She will go for several days without cough and then wake at night suddenly coughing.  I told her this sounded like reflux which we have explored before.  She continues daily Prilosec.  Denies problems with swallowing.  Has  degenerative disc disease and scoliosis which she thinks contributed.  We discussed resuming trial of a steroid inhaler since she recognizes prednisone helps. CXR 05/17/2017- Lungs are adequately inflated without focal consolidation or effusion. Cardiomediastinal silhouette is within normal. There is calcified plaque over the thoracic aorta. Mild degenerate change of the spine. No active cardiopulmonary disease.   ROS-see HPI  + = positive Constitutional:   No-   weight loss, night sweats, fevers, chills, fatigue, lassitude. HEENT:   No-  headaches, difficulty swallowing, tooth/dental problems, sore throat,       No-  sneezing, itching, ear ache, nasal congestion, post nasal drip,  CV:  No-   chest pain, orthopnea, PND, swelling in lower extremities, anasarca,                                                   dizziness, palpitations Resp: +shortness of breath with exertion or at rest.               productive cough,  + non-productive cough,  No- coughing up of blood.              No-   change in color of mucus.   wheezing.   Skin: No-   rash or lesions. GI:  +heartburn, indigestion, No-abdominal pain, nausea, vomiting,  GU: . MS:  No-   joint pain or swelling.   + back pain Neuro-     nothing unusual Psych:  No- change in mood or affect. +depression or anxiety.  No memory loss.    Objective:  OBJ- Physical Exam    + Very talkative General- Alert, Oriented, Affect-appropriate, Distress- none  acute, + overweight, +O2 Skin- rash-none, lesions- none, excoriation- none Lymphadenopathy- none Head- atraumatic            Eyes- Gross vision intact, PERRLA, conjunctivae and secretions clear            Ears- Hearing, canals-normal            Nose- Clear, no-Septal dev, mucus, polyps, erosion, perforation             Throat- Mallampati IV , mucosa -no thrush , drainage- none, tonsils- atrophic,                                      stridor-none,  + missing teeth Neck- flexible , trachea midline,  no stridor , thyroid nl, carotid no bruit Chest - symmetrical excursion , unlabored           Heart/CV- RRR , no murmur , no gallop  , no rub, nl s1 s2                           - JVD- none , edema- none, stasis changes- none, varices- none           Lung-  + distant but clear, unlabored, wheeze-none, cough- none while here , dullness-none, rub- none           Chest wall-  Abd-  Br/ Gen/ Rectal- Not done, not indicated Extrem- cyanosis- none, clubbing, none, atrophy- none, strength- nl Neuro- grossly intact to observation

## 2018-05-31 NOTE — Assessment & Plan Note (Signed)
I think her baseline is stable.  Activity is limited so she does not challenge herself.  We are going to see if we can help her cough by returning to a steroid inhaler. Plan-replace Stiolto with Symbicort 160

## 2018-06-03 ENCOUNTER — Telehealth: Payer: Self-pay | Admitting: Internal Medicine

## 2018-06-03 MED ORDER — PREDNISONE 10 MG PO TABS: 10.0000 mg | ORAL_TABLET | Freq: Every day | ORAL | 0 refills | Status: DC

## 2018-06-03 NOTE — Telephone Encounter (Signed)
Spoke with pt. She is aware of CY's recommendations. Pt states that she has never been on Spiriva.  CY - please clarify if you meant Symbicort or Spiriva. Thanks.

## 2018-06-03 NOTE — Telephone Encounter (Signed)
My mistake- I meant Symbicort can continue while she is on prednisone. If cough persists after finishing the prednisone, ok to switch back to [employer].

## 2018-06-03 NOTE — Telephone Encounter (Signed)
Spoke with pt. States at her last OV, CY changed Stiolto to Symbicort. Since starting Symbicort her coughing has increased. Cough is non productive. Denies chest tightness, wheezing or shortness of breath. Pt would like CY's recommendations.  CY - please advise. Thanks.

## 2018-06-03 NOTE — Telephone Encounter (Signed)
Pt aware of recs from [facility] and Rx has been sent to [facility] Nothing more needed at this time.

## 2018-06-03 NOTE — Telephone Encounter (Signed)
Offer prednisone 10 mg, # 5, 1 daily, no ref. She can continue Spiriva while on this. If she finishes the prednisone and cough is no better, then ok to go back to [employer].

## 2018-06-05 ENCOUNTER — Telehealth: Payer: Self-pay | Admitting: Internal Medicine

## 2018-06-05 NOTE — Telephone Encounter (Signed)
Call made to patient, made aware she can switch back to [employer]. She states she has plenty refills at home. She said to tell CY thank you very much. Nothing further is needed at this time.

## 2018-06-05 NOTE — Telephone Encounter (Signed)
Called and spoke with Patient.  She stated that since she started symbicort, she feels her breathing is worse.  She stated that 30-40 minutes after using Symbicort, she becomes [place], has non productive cough, and runny nose.  She has to turn her O2 up to 3 L [place], use Xopenex nebs, and rest. She is requesting to go back on Stiolto.  Will route to Dr Annamaria Boots  Allergies  Allergen Reactions  . Chlordiazepoxide-Clidinium Other (See Comments)    Sleepy,weak  . Biaxin [Clarithromycin] Other (See Comments)    Foul taste, abd pain, diarrhea  . Tramadol Other (See Comments)    Caused tremors  . Codeine Other (See Comments)    REACTION: gi upset- only in high doses per pt.  Can tolerate cough syrup with codeine.    Current Outpatient Medications on File Prior to Visit  Medication Sig Dispense Refill  . ACCU-CHEK SOFTCLIX LANCETS lancets 1 each by Other route daily. Use as instructed to check blood sugar 8 times daily. 250 each 3  . acetaminophen (TYLENOL) 500 MG tablet Take 1,000 mg by mouth every 6 (six) hours as needed for headache (pain).     Marland Kitchen ALPRAZolam (XANAX) 0.5 MG tablet Half-1 tablet twice a day as needed for anxiety (Patient taking differently: daily as needed. Half-1 tablet twice a day as needed for anxiety) 60 tablet 4  . aspirin 325 MG tablet Take 325 mg by mouth daily.      . B-D UF III MINI PEN NEEDLES 31G X 5 MM MISC USE 5 PER DAY TO INJECT INSULIN 200 each 3  . budesonide-formoterol (SYMBICORT) 160-4.5 MCG/ACT inhaler Inhale 2 puffs, then rinse mouth, twice daily 1 Inhaler 6  . budesonide-formoterol (SYMBICORT) 160-4.5 MCG/ACT inhaler Inhale 2 puffs into the lungs 2 (two) times daily. 1 Inhaler 0  . cholestyramine (QUESTRAN) 4 g packet 1 scoop/pack twice a day as needed. 60 each 3  . dicyclomine (BENTYL) 10 MG capsule Take 1 capsule (10 mg total) by mouth 2 (two) times daily. 60 capsule 11  . glucose blood (ACCU-CHEK AVIVA PLUS) test strip Use as instructed to check blood sugars 8 times  per day dx code E11.65 250 each 3  . insulin aspart (NOVOLOG FLEXPEN) 100 UNIT/ML FlexPen INJECT 16 UNITS EVERY MORNING WITH BREAKFAST, 22 UNITS AT LUNCH AND 28 UNITS AT SUPPER. 30 mL 0  . Insulin Degludec (TRESIBA FLEXTOUCH) 200 UNIT/ML SOPN Inject 60 Units into the skin daily. 18 mL 3  . Insulin Pen Needle (B-D ULTRAFINE III SHORT PEN) 31G X 8 MM MISC TEST 3 TIMES A DAY 100 each 5  . Insulin Syringe-Needle U-100 (BD INSULIN SYRINGE ULTRAFINE) 31G X 15/64" 0.5 ML MISC Use 2 per day dx code E11.65 100 each 5  . ipratropium-albuterol (DUONEB) 0.5-2.5 (3) MG/3ML SOLN 1 vial in neb every 8 hours and as needed 90 mL 11  . irbesartan (AVAPRO) 75 MG tablet Take 1 tablet (75 mg total) by mouth daily. 30 tablet 3  . Lancets Misc. (ACCU-CHEK SOFTCLIX LANCET DEV) KIT 1 each by Does not apply route daily. Use as instructed to check blood sugar 8 times daily. 1 kit 0  . magic mouthwash SOLN Swish with 2 teaspoonfuls by mouth as needed (Patient taking differently: daily as needed. Swish with 2 teaspoonfuls by mouth as needed) 240 mL 3  . meclizine (ANTIVERT) 25 MG tablet take 1 tablet by mouth three times a day if needed for dizziness/vertigo (Patient taking differently: daily as needed. take 1  tablet by mouth three times a day if needed for dizziness/vertigo) 30 tablet 3  . methenamine (MANDELAMINE) 0.5 GM tablet     . metoprolol succinate (TOPROL-XL) 25 MG 24 hr tablet Take 1 tablet (25 mg total) by mouth daily. 90 tablet 2  . omeprazole (PRILOSEC) 20 MG capsule Take 1 capsule (20 mg total) by mouth daily. 90 capsule 2  . OXYGEN Inhale 3 L into the lungs continuous.     . pravastatin (PRAVACHOL) 20 MG tablet Take 1 tablet (20 mg total) by mouth daily. 90 tablet 5  . predniSONE (DELTASONE) 10 MG tablet Take 1 tablet (10 mg total) by mouth daily with breakfast. 5 tablet 0  . promethazine (PHENERGAN) 25 MG tablet Take 1 tablet (25 mg total) by mouth every 6 (six) hours as needed for nausea or vomiting. 30 tablet 0   . sodium chloride HYPERTONIC 3 % nebulizer solution 1 ampule neb every 6 hours if needed to clear mucus 75 mL 12  . Spacer/Aero-Holding Chambers (AEROCHAMBER MV) inhaler Use as instructed 1 each 0  . trimethoprim (TRIMPEX) 100 MG tablet     . Vitamin D, Ergocalciferol, (DRISDOL) 1.25 MG (50000 UT) CAPS capsule Take 1 capsule (50,000 Units total) by mouth every 7 (seven) days. 4 capsule 3  . XOPENEX HFA 45 MCG/ACT inhaler INHALE 2 PUFFS EVERY 6 HOURS IF NEEDED FOR WHEEZING OR SHORTNESS OF BREATH. 15 g 2   No current facility-administered medications on file prior to visit.

## 2018-06-05 NOTE — Telephone Encounter (Signed)
Ok to switch back to [employer] 2 puffs daily

## 2018-06-06 ENCOUNTER — Other Ambulatory Visit: Payer: Self-pay | Admitting: Endocrinology

## 2018-06-11 ENCOUNTER — Ambulatory Visit (INDEPENDENT_AMBULATORY_CARE_PROVIDER_SITE_OTHER): Payer: Medicare Other | Admitting: Physical Medicine and Rehabilitation

## 2018-06-11 ENCOUNTER — Telehealth (INDEPENDENT_AMBULATORY_CARE_PROVIDER_SITE_OTHER): Payer: Self-pay | Admitting: *Deleted

## 2018-06-11 NOTE — Telephone Encounter (Signed)
Called pt lvm #1 to r/s appt.

## 2018-06-14 ENCOUNTER — Other Ambulatory Visit: Payer: Self-pay | Admitting: Endocrinology

## 2018-06-18 DIAGNOSIS — Z961 Presence of intraocular lens: Secondary | ICD-10-CM | POA: Diagnosis not present

## 2018-06-18 DIAGNOSIS — H04123 Dry eye syndrome of bilateral lacrimal glands: Secondary | ICD-10-CM | POA: Diagnosis not present

## 2018-06-18 DIAGNOSIS — H15101 Unspecified episcleritis, right eye: Secondary | ICD-10-CM | POA: Diagnosis not present

## 2018-06-27 ENCOUNTER — Telehealth: Payer: Self-pay | Admitting: Internal Medicine

## 2018-06-27 MED ORDER — CHLORPHENIRAMINE MALEATE 4 MG PO TABS: 4.0000 mg | ORAL_TABLET | ORAL | 1 refills | Status: DC | PRN

## 2018-06-27 NOTE — Telephone Encounter (Signed)
Patient is coughing again with no mucus and sneezing and nose is running. Denied any fever.   She would like something sent in to the [facility].

## 2018-06-27 NOTE — Telephone Encounter (Signed)
Suggest Clor-Trimeton 4 mg every 6-8 hours as needed.   # 30,   Ref x 1, please deliver  This antihistamine should help nose, post nasal drip and cough  She can combine it with other cough and cold remedies as needed. I don't think an antibiotic would help now.

## 2018-06-27 NOTE — Telephone Encounter (Signed)
Called and spoke with the patient she is aware anf rx has been sent nothing further is needed.

## 2018-07-08 ENCOUNTER — Telehealth: Payer: Self-pay | Admitting: Endocrinology

## 2018-07-08 NOTE — Telephone Encounter (Signed)
If she is taking 56 units of Tresiba she will go up to 62 units.  If her morning sugar is still over 150 by Thursday she will go up to 66 units Also needs to add extra 4 units of Humalog with her lunch and dinner compared to what she is doing now

## 2018-07-08 NOTE — Telephone Encounter (Signed)
Called pt and gave her MD message. Pt verbalized understanding and wrote new dosages down and repeated the back correctly.

## 2018-07-08 NOTE — Telephone Encounter (Signed)
Patient requests to be called at ph# [phone] re: patient has been having high blood sugars for a couple of weeks (over 300 this morning).

## 2018-07-08 NOTE — Telephone Encounter (Signed)
Please see blood sugar log placed on desk.

## 2018-07-24 ENCOUNTER — Telehealth: Payer: Self-pay | Admitting: Internal Medicine

## 2018-07-24 MED ORDER — DOXYCYCLINE HYCLATE 100 MG PO TABS: ORAL_TABLET | ORAL | 0 refills | Status: DC

## 2018-07-24 NOTE — Telephone Encounter (Signed)
Called and spoke with Patient. Dr Annamaria Boots recommendations given.. Understanding stated.  Prescription sent to requested pharmacy. Nothing further at this time.

## 2018-07-24 NOTE — Telephone Encounter (Signed)
Offer doxycycline 100 mg, # 8, 2 today then one daily  If she thinks this may be seasonal allergy, the tree pollens are heavy now. She can hang on to the antibiotic and try taking an antihistamine like claritin or allegra for a few days to see if that helps.

## 2018-07-24 NOTE — Telephone Encounter (Signed)
Called and spoke with patient, she stated that has had a sore throat, a productive cough that has a green tinged mucus. Patient denies fever, no chest congestion and no body aches or chills. Patient has not recently traveled nor been around anyone who has. Patient states that this is the same thing she gets every year at this time.   CY please advise, thank you.    Current Outpatient Medications on File Prior to Visit  Medication Sig Dispense Refill  . ACCU-CHEK SOFTCLIX LANCETS lancets 1 each by Other route daily. Use as instructed to check blood sugar 8 times daily. 250 each 3  . acetaminophen (TYLENOL) 500 MG tablet Take 1,000 mg by mouth every 6 (six) hours as needed for headache (pain).     Marland Kitchen ALPRAZolam (XANAX) 0.5 MG tablet Half-1 tablet twice a day as needed for anxiety (Patient taking differently: daily as needed. Half-1 tablet twice a day as needed for anxiety) 60 tablet 4  . aspirin 325 MG tablet Take 325 mg by mouth daily.      . B-D UF III MINI PEN NEEDLES 31G X 5 MM MISC USE 5 PER DAY TO INJECT INSULIN 200 each 3  . budesonide-formoterol (SYMBICORT) 160-4.5 MCG/ACT inhaler Inhale 2 puffs, then rinse mouth, twice daily 1 Inhaler 6  . budesonide-formoterol (SYMBICORT) 160-4.5 MCG/ACT inhaler Inhale 2 puffs into the lungs 2 (two) times daily. 1 Inhaler 0  . chlorpheniramine (CHLOR-TRIMETON) 4 MG tablet Take 1 tablet (4 mg total) by mouth every 4 (four) hours as needed for allergies. 30 tablet 1  . cholestyramine (QUESTRAN) 4 g packet 1 scoop/pack twice a day as needed. 60 each 3  . dicyclomine (BENTYL) 10 MG capsule Take 1 capsule (10 mg total) by mouth 2 (two) times daily. 60 capsule 11  . glucose blood (ACCU-CHEK AVIVA PLUS) test strip Use as instructed to check blood sugars 8 times per day dx code E11.65 250 each 3  . insulin aspart (NOVOLOG FLEXPEN) 100 UNIT/ML FlexPen INJECT 16 UNITS EVERY MORNING WITH BREAKFAST, 22 UNITS AT LUNCH AND 28 UNITS AT SUPPER. 30 mL 0  . Insulin Degludec  (TRESIBA FLEXTOUCH) 200 UNIT/ML SOPN Inject 60 Units into the skin daily. 18 mL 3  . Insulin Pen Needle (B-D ULTRAFINE III SHORT PEN) 31G X 8 MM MISC TEST 3 TIMES A DAY 100 each 5  . Insulin Syringe-Needle U-100 (BD INSULIN SYRINGE ULTRAFINE) 31G X 15/64" 0.5 ML MISC Use 2 per day dx code E11.65 100 each 5  . ipratropium-albuterol (DUONEB) 0.5-2.5 (3) MG/3ML SOLN 1 vial in neb every 8 hours and as needed 90 mL 11  . irbesartan (AVAPRO) 75 MG tablet Take 1 tablet (75 mg total) by mouth daily. 30 tablet 3  . Lancets Misc. (ACCU-CHEK SOFTCLIX LANCET DEV) KIT 1 each by Does not apply route daily. Use as instructed to check blood sugar 8 times daily. 1 kit 0  . magic mouthwash SOLN Swish with 2 teaspoonfuls by mouth as needed (Patient taking differently: daily as needed. Swish with 2 teaspoonfuls by mouth as needed) 240 mL 3  . meclizine (ANTIVERT) 25 MG tablet take 1 tablet by mouth three times a day if needed for dizziness/vertigo (Patient taking differently: daily as needed. take 1 tablet by mouth three times a day if needed for dizziness/vertigo) 30 tablet 3  . methenamine (MANDELAMINE) 0.5 GM tablet     . metoprolol succinate (TOPROL-XL) 25 MG 24 hr tablet TAKE 1 TABLET ONCE DAILY. 90 tablet 0  .  omeprazole (PRILOSEC) 20 MG capsule TAKE (1) CAPSULE DAILY. 90 capsule 0  . OXYGEN Inhale 3 L into the lungs continuous.     . pravastatin (PRAVACHOL) 20 MG tablet Take 1 tablet (20 mg total) by mouth daily. 90 tablet 5  . predniSONE (DELTASONE) 10 MG tablet Take 1 tablet (10 mg total) by mouth daily with breakfast. 5 tablet 0  . promethazine (PHENERGAN) 25 MG tablet Take 1 tablet (25 mg total) by mouth every 6 (six) hours as needed for nausea or vomiting. 30 tablet 0  . sodium chloride HYPERTONIC 3 % nebulizer solution 1 ampule neb every 6 hours if needed to clear mucus 75 mL 12  . Spacer/Aero-Holding Chambers (AEROCHAMBER MV) inhaler Use as instructed 1 each 0  . trimethoprim (TRIMPEX) 100 MG tablet      . Vitamin D, Ergocalciferol, (DRISDOL) 1.25 MG (50000 UT) CAPS capsule Take 1 capsule (50,000 Units total) by mouth every 7 (seven) days. 4 capsule 3  . XOPENEX HFA 45 MCG/ACT inhaler INHALE 2 PUFFS EVERY 6 HOURS IF NEEDED FOR WHEEZING OR SHORTNESS OF BREATH. 15 g 2   No current facility-administered medications on file prior to visit.

## 2018-08-05 ENCOUNTER — Other Ambulatory Visit: Payer: Self-pay | Admitting: Endocrinology

## 2018-08-12 ENCOUNTER — Other Ambulatory Visit: Payer: Self-pay | Admitting: Endocrinology

## 2018-08-12 ENCOUNTER — Telehealth: Payer: Self-pay | Admitting: Endocrinology

## 2018-08-12 ENCOUNTER — Ambulatory Visit: Payer: Medicare Other | Admitting: Internal Medicine

## 2018-08-12 NOTE — Telephone Encounter (Signed)
[facility] is calling to get Rx for Aviva Test strips resent.  Medicare needs the DX code on the prescription and a 30 day supply is 250 not [place] [phone]

## 2018-08-13 ENCOUNTER — Other Ambulatory Visit: Payer: Self-pay | Admitting: Endocrinology

## 2018-08-13 ENCOUNTER — Other Ambulatory Visit: Payer: Self-pay

## 2018-08-13 DIAGNOSIS — H35372 Puckering of macula, left eye: Secondary | ICD-10-CM | POA: Diagnosis not present

## 2018-08-13 DIAGNOSIS — Z961 Presence of intraocular lens: Secondary | ICD-10-CM | POA: Diagnosis not present

## 2018-08-13 DIAGNOSIS — H26491 Other secondary cataract, right eye: Secondary | ICD-10-CM | POA: Diagnosis not present

## 2018-08-13 DIAGNOSIS — H04123 Dry eye syndrome of bilateral lacrimal glands: Secondary | ICD-10-CM | POA: Diagnosis not present

## 2018-08-13 DIAGNOSIS — H1851 Endothelial corneal dystrophy: Secondary | ICD-10-CM | POA: Diagnosis not present

## 2018-08-13 MED ORDER — GLUCOSE BLOOD VI STRP: ORAL_STRIP | 2 refills | Status: DC

## 2018-08-13 NOTE — Telephone Encounter (Signed)
RX corrected.

## 2018-08-13 NOTE — Telephone Encounter (Signed)
Please refill if appropriate

## 2018-08-14 ENCOUNTER — Telehealth: Payer: Self-pay | Admitting: Endocrinology

## 2018-08-14 ENCOUNTER — Ambulatory Visit: Payer: Medicare Other | Admitting: Endocrinology

## 2018-08-14 NOTE — Telephone Encounter (Signed)
Clarification given.

## 2018-08-14 NOTE — Telephone Encounter (Signed)
Per [facility], "Caller wants to verify the doctor wants the patient to test 8 times per day."

## 2018-09-02 ENCOUNTER — Telehealth (INDEPENDENT_AMBULATORY_CARE_PROVIDER_SITE_OTHER): Payer: Self-pay | Admitting: Orthopedic Surgery

## 2018-09-04 DIAGNOSIS — N3020 Other chronic cystitis without hematuria: Secondary | ICD-10-CM | POA: Diagnosis not present

## 2018-09-09 NOTE — Telephone Encounter (Signed)
ERROR

## 2018-09-10 ENCOUNTER — Encounter: Payer: Self-pay | Admitting: Orthopedic Surgery

## 2018-09-10 ENCOUNTER — Other Ambulatory Visit: Payer: Self-pay

## 2018-09-10 ENCOUNTER — Ambulatory Visit (INDEPENDENT_AMBULATORY_CARE_PROVIDER_SITE_OTHER): Payer: Medicare Other | Admitting: Orthopedic Surgery

## 2018-09-10 ENCOUNTER — Other Ambulatory Visit: Payer: Self-pay | Admitting: Endocrinology

## 2018-09-10 ENCOUNTER — Ambulatory Visit (INDEPENDENT_AMBULATORY_CARE_PROVIDER_SITE_OTHER): Payer: Medicare Other

## 2018-09-10 VITALS — Ht 63.0 in | Wt 186.0 lb

## 2018-09-10 DIAGNOSIS — M545 Low back pain, unspecified: Secondary | ICD-10-CM

## 2018-09-10 DIAGNOSIS — M4150 Other secondary scoliosis, site unspecified: Secondary | ICD-10-CM

## 2018-09-10 DIAGNOSIS — M5410 Radiculopathy, site unspecified: Secondary | ICD-10-CM | POA: Diagnosis not present

## 2018-09-10 DIAGNOSIS — M4180 Other forms of scoliosis, site unspecified: Secondary | ICD-10-CM

## 2018-09-10 MED ORDER — PREDNISONE 10 MG PO TABS: 10.0000 mg | ORAL_TABLET | Freq: Every day | ORAL | 3 refills | Status: DC

## 2018-09-10 NOTE — Progress Notes (Signed)
Office Visit Note   Patient: [name]           Date of Birth: December 14, 1935           MRN: [id number] Visit Date: 09/10/2018              Requested by: [name], MD [address] [place], [place] [address] PCP: [name], MD  Chief Complaint  Patient presents with  . Left Leg - Follow-up  . Left Hip - Follow-up      HPI: Patient is a 83 year old woman who presents complaining of worsening left-sided radicular pain down the entire lateral aspect of her left leg.  She states it radiates to the buttocks.  She states the pain is been worse over the past month denies any injuries.  Patient states that her shoulder has done much better since the steroid subacromial injection.  Assessment & Plan: Visit Diagnoses:  1. Low back pain, unspecified back pain laterality, unspecified chronicity, unspecified whether sciatica present   2. Radicular pain of left lower extremity   3. Scoliosis due to degenerative disease of spine in adult patient     Plan: We will schedule an appointment with Dr. Ernestina Patches for evaluation for epidural steroid injection.  A prescription was called in for prednisone 10 mg to be taken with breakfast daily.  She will monitor her glucose to make sure this does not get out of control with the prednisone.  Follow-Up Instructions: Return if symptoms worsen or fail to improve.   Ortho Exam  Patient is alert, oriented, no adenopathy, well-dressed, normal affect, normal respiratory effort. Examination patient ambulates with a cane.  She is on nasal cannula FiO2.  She has a negative straight leg raise she has good plantar flexion dorsiflexion strength she has weakness with hip flexion and knee extension.  Imaging: Xr Lumbar Spine 2-3 Views  Result Date: 09/10/2018 2 view radiographs of lumbar spine shows calcification of the aorta with largest diameter 3 cm.  AP shows a degenerative scoliosis with collapse worse at L3-4 on the left.  No images are  attached to the encounter.  Labs: Lab Results  Component Value Date   HGBA1C 8.8 (A) 05/09/2018   HGBA1C 8.2 (A) 02/06/2018   HGBA1C 9.1 09/05/2017   LABURIC 4.1 11/16/2014   REPTSTATUS 11/09/2016 FINAL 11/08/2016   CULT NO GROWTH 11/08/2016   LABORGA STAPHYLOCOCCUS SPECIES (COAGULASE NEGATIVE) 05/24/2015     Lab Results  Component Value Date   ALBUMIN 4.1 02/06/2018   ALBUMIN 4.0 11/06/2017   ALBUMIN 3.9 05/07/2017   LABURIC 4.1 11/16/2014    Body mass index is 32.95 kg/m.  Orders:  Orders Placed This Encounter  Procedures  . XR Lumbar Spine 2-3 Views   Meds ordered this encounter  Medications  . predniSONE (DELTASONE) 10 MG tablet    Sig: Take 1 tablet (10 mg total) by mouth daily with breakfast.    Dispense:  30 tablet    Refill:  3     Procedures: No procedures performed  Clinical Data: No additional findings.  ROS:  All other systems negative, except as noted in the HPI. Review of Systems  Objective: Vital Signs: Ht 5' 3"  (1.6 m)   Wt 186 lb (84.4 kg)   BMI 32.95 kg/m   Specialty Comments:  No specialty comments available.  PMFS History: Patient Active Problem List   Diagnosis Date Noted  . Scoliosis due to degenerative disease of spine in adult patient 09/10/2018  .  Radicular pain of left lower extremity 09/10/2018  . Diarrhea 11/08/2016  . Thrombocytopenia ([place]) 11/08/2016  . Degenerative lumbar spinal stenosis 10/27/2015  . Arthralgia 09/22/2015  . Chronic respiratory failure with hypoxia ([place]) 01/11/2015  . Diabetic polyneuropathy associated with type 2 diabetes mellitus ([place]) 11/16/2014  . Essential hypertension, benign 05/19/2013  . Vertigo 05/11/2013  . Dizziness 05/11/2013  . Tachycardia 05/11/2013  . Lower urinary tract infectious disease 05/11/2013  . Sinusitis 05/11/2013  . Other and unspecified hyperlipidemia 02/15/2013  . Type II diabetes mellitus, uncontrolled ([place]) 01/01/2013  . Benign paroxysmal positional vertigo  08/09/2012  . Type II or unspecified type diabetes mellitus with neurological manifestations, uncontrolled(250.62) 08/09/2012  . GLAUCOMA 03/16/2010  . HOARSENESS 03/16/2010  . COUGH 12/23/2009  . CHEST PAIN 12/23/2009  . MUSCULOSKELETAL PAIN 01/28/2009  . COPD mixed type ([place]) 01/15/2009  . Seasonal and perennial allergic rhinitis 11/23/2007  . CHRONIC RHINITIS 07/16/2007  . G E R D 05/29/2007  . I B S-DIARRHEAL PREDOMINATE 05/29/2007  . FATTY LIVER DISEASE 05/29/2007   Past Medical History:  Diagnosis Date  . Allergic rhinitis, cause unspecified    Sinus CT Rec 12-23-2009  . Arthritis   . Benign paroxysmal positional vertigo 08/09/2012  . 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 Complication of anesthesia    takes along time to wake up  . Diabetes mellitus   . Diarrhea   . Diverticulosis   . Esophageal reflux   . Esophageal stricture   . GERD (gastroesophageal reflux disease)   . Glaucoma   . Hiatal hernia   . Hypertension   . Irritable bowel syndrome   . Neuropathy in diabetes ([place]) 08/09/2012  . Other chronic nonalcoholic liver disease   . Oxygen deficiency   . Sciatica   . Scoliosis   . Shingles 2010  . Shortness of breath   . Steatohepatitis     Family History  Problem Relation Age of Onset  . Heart disease Father   . Lung cancer Mother        small cell;Byssinosis  . Lung cancer Sister   . Liver cancer Sister        ? mets from another area of the body  . Diabetes Unknown        grandmother  . Stroke Maternal Grandfather     Past Surgical History:  Procedure Laterality Date  . APPENDECTOMY    . CARPAL TUNNEL RELEASE     right hand  . CHOLECYSTECTOMY OPEN  1978  . COLONOSCOPY  07-2001   mild diverticulosis  . ESOPHAGOGASTRODUODENOSCOPY  1937,90-24   H Hernia,es.stricture s/p dil 23F  . INTRAOCULAR LENS INSERTION Bilateral   . LIVER BIOPSY  [phone]  . PARATHYROID EXPLORATION    . TONSILLECTOMY AND  ADENOIDECTOMY    . TOTAL ABDOMINAL HYSTERECTOMY    . ULNAR NERVE TRANSPOSITION  12/07/2011   Procedure: ULNAR NERVE DECOMPRESSION/TRANSPOSITION;this was cancelled-not done  Surgeon: Cammie Sickle., MD;  Location: [name];  Service: Orthopedics;  Laterality: Right;  right ulnar nerve in situ decompression  . ULNAR TUNNEL RELEASE  03/07/2012   Procedure: CUBITAL TUNNEL RELEASE;  Surgeon: Roseanne Kaufman, MD;  Location: [facility];  Service: Orthopedics;  Laterality: Right;  ulnar nerve release at the elbow     Social History   Occupational History  . Occupation: Retired   Tobacco Use  . Smoking status: Former Smoker    Types:  Cigarettes    Last attempt to quit: 05/08/1988    Years since quitting: 30.3  . Smokeless tobacco: Never Used  Substance and Sexual Activity  . Alcohol use: No    Alcohol/week: 0.0 standard drinks  . Drug use: No  . Sexual activity: Never

## 2018-09-13 ENCOUNTER — Other Ambulatory Visit: Payer: Self-pay | Admitting: Endocrinology

## 2018-09-13 ENCOUNTER — Encounter: Payer: Self-pay | Admitting: Endocrinology

## 2018-09-13 ENCOUNTER — Ambulatory Visit (INDEPENDENT_AMBULATORY_CARE_PROVIDER_SITE_OTHER): Payer: Medicare Other | Admitting: Endocrinology

## 2018-09-13 DIAGNOSIS — E1142 Type 2 diabetes mellitus with diabetic polyneuropathy: Secondary | ICD-10-CM | POA: Diagnosis not present

## 2018-09-13 DIAGNOSIS — Z794 Long term (current) use of insulin: Secondary | ICD-10-CM | POA: Diagnosis not present

## 2018-09-13 DIAGNOSIS — R634 Abnormal weight loss: Secondary | ICD-10-CM | POA: Diagnosis not present

## 2018-09-13 DIAGNOSIS — E559 Vitamin D deficiency, unspecified: Secondary | ICD-10-CM

## 2018-09-13 DIAGNOSIS — E1165 Type 2 diabetes mellitus with hyperglycemia: Secondary | ICD-10-CM | POA: Diagnosis not present

## 2018-09-13 MED ORDER — VITAMIN D (ERGOCALCIFEROL) 1.25 MG (50000 UNIT) PO CAPS: 50000.0000 [IU] | ORAL_CAPSULE | ORAL | 3 refills | Status: DC

## 2018-09-13 NOTE — Progress Notes (Signed)
Patient ID: [name], female   DOB: 1936/02/12, 83 y.o.   MRN: [id number]   Today's office visit was provided via telemedicine using audio technique Explained to the patient and the the limitations of evaluation and management by telemedicine and the availability of in person appointments.  The patient understood the limitations and agreed to proceed. Patient also understood that the telehealth visit is billable. . Location of the patient: Home . Location of the provider: Office Only the patient and myself were participating in the encounter    Reason for Appointment: Diabetes and follow-up of multiple problems  History of Present Illness    PROBLEM 1:  DIABETES type II since 1980   She has been on insulin using Lantus and Humalog for several years and usually A1c around 8% despite fairly good compliance with diet, exercise and glucose monitoring. She also had been on Actos previously which probably benefited her especially with her fatty liver but she stopped it because of fear of side effects   She was changed from NovoLog to U-500 insulin starting in 02/2016; she did not related was controlling her sugars and preferred to go back to NovoLog.    Recent history:   Insulin regimen: Tresiba  60 units in a.m.  NOVOLOG before meals 18-18-24 units before meals  Her A1c has been previously consistently around 9- 10% and no recent labs available   She has not been checking her blood sugars much recently and mostly only fasting usually  She does appear to have mostly high fasting readings including 170 today  She said that she is very irregular with her eating habits and not eating proper meals because of generally decreased appetite  She thinks she may have lost 20 pounds this year  However most of her difficulties are related to her low back pain and now she is on prednisone 10 mg daily from her back surgeon for the last 2 to 3 days  With this her blood sugars  have been as high as 325 last night  She has been irregular with her mealtime insulin and may forget the dose especially at lunch  Has not started increasing her insulin with her sugars been running high   Oral hypoglycemic drugs: none       Side effects from medications: None         Proper timing of medications in relation to meals: Yes.          Monitors blood glucose:  About 3-4 times a day   Glucometer:  Accu-Chek         Blood Glucose averages recently from patient reading from her meter    PRE-MEAL Fasting Lunch Dinner Bedtime Overall  Glucose range:  87-195  160, 207  299  157, 325   Mean/median:     ?   Previous readings:  PRE-MEAL Fasting Lunch Dinner Bedtime Overall  Glucose range:  92-270  74-253   138-386   Mean/median:  150  141  227  232  169+/-63     Meals: 3 meals per day.  Breakfast 7 AM ; lunch 1-2 pm Supper 6 to 7 PM     Physical activity: exercise: Unable to do any because of dyspnea and fatigue            Dietician visit: Most recent: Several years ago            Complications: are: Peripheral neuropathy with sensory loss     Wt Readings  from Last 3 Encounters:  09/10/18 186 lb (84.4 kg)  05/31/18 186 lb (84.4 kg)  05/21/18 185 lb 9.6 oz (84.2 kg)    Lab Results  Component Value Date   HGBA1C 8.8 (A) 05/09/2018   HGBA1C 8.2 (A) 02/06/2018   HGBA1C 9.1 09/05/2017   Lab Results  Component Value Date   MICROALBUR 1.1 05/11/2017   LDLCALC 75 11/06/2017   CREATININE 0.76 02/06/2018     OTHER Current problems  are detailed in review of systems   Allergies as of 09/13/2018      Reactions   Chlordiazepoxide-clidinium Other (See Comments)   Sleepy,weak   Biaxin [clarithromycin] Other (See Comments)   Foul taste, abd pain, diarrhea   Tramadol Other (See Comments)   Caused tremors   Codeine Other (See Comments)   REACTION: gi upset- only in high doses per pt.  Can tolerate cough syrup with codeine.       Medication List       Accurate  as of Sep 13, 2018  9:27 AM. If you have any questions, ask your nurse or doctor.        Accu-Chek Softclix Lancet Dev Kit 1 each by Does not apply route daily. Use as instructed to check blood sugar 8 times daily.   Accu-Chek Softclix Lancets lancets 1 each by Other route daily. Use as instructed to check blood sugar 8 times daily.   acetaminophen 500 MG tablet Commonly known as:  TYLENOL Take 1,000 mg by mouth every 6 (six) hours as needed for headache (pain).   AeroChamber MV inhaler Use as instructed   ALPRAZolam 0.5 MG tablet Commonly known as:  XANAX TAKE 1/2 TO 1 TABLET TWICE DAILY AS NEEDED FOR ANXIETY.   aspirin 325 MG tablet Take 325 mg by mouth daily.   budesonide-formoterol 160-4.5 MCG/ACT inhaler Commonly known as:  Symbicort Inhale 2 puffs, then rinse mouth, twice daily   budesonide-formoterol 160-4.5 MCG/ACT inhaler Commonly known as:  Symbicort Inhale 2 puffs into the lungs 2 (two) times daily.   chlorpheniramine 4 MG tablet Commonly known as:  CHLOR-TRIMETON Take 1 tablet (4 mg total) by mouth every 4 (four) hours as needed for allergies.   cholestyramine 4 g packet Commonly known as:  Questran 1 scoop/pack twice a day as needed.   dicyclomine 10 MG capsule Commonly known as:  Bentyl Take 1 capsule (10 mg total) by mouth 2 (two) times daily.   doxycycline 100 MG tablet Commonly known as:  VIBRA-TABS Take 2 tabs first day, then 1 tab daily till gone   glucose blood test strip Commonly known as:  Accu-Chek Aviva Plus USE AS INSTRUCTED TO CHECK BLOOD SUGARS 8 TIMES PER DAY. DX:E11.65   Insulin Degludec 200 UNIT/ML Sopn Commonly known as:  [place] FlexTouch Inject 60 Units into the skin daily.   Insulin Pen Needle 31G X 8 MM Misc Commonly known as:  B-D ULTRAFINE III SHORT PEN TEST 3 TIMES A DAY   B-D UF III MINI PEN NEEDLES 31G X 5 MM Misc Generic drug:  Insulin Pen Needle USE 5 PER DAY TO INJECT INSULIN   Insulin Syringe-Needle U-100 31G X  15/64" 0.5 ML Misc Commonly known as:  BD Insulin Syringe Ultrafine Use 2 per day dx code E11.65   ipratropium-albuterol 0.5-2.5 (3) MG/3ML Soln Commonly known as:  DUONEB 1 vial in neb every 8 hours and as needed   irbesartan 75 MG tablet Commonly known as:  Avapro Take 1 tablet (75 mg total)  by mouth daily.   magic mouthwash Soln Swish with 2 teaspoonfuls by mouth as needed What changed:    when to take this  reasons to take this   meclizine 25 MG tablet Commonly known as:  ANTIVERT take 1 tablet by mouth three times a day if needed for dizziness/vertigo What changed:    when to take this  reasons to take this   methenamine 0.5 GM tablet Commonly known as:  MANDELAMINE   metoprolol succinate 25 MG 24 hr tablet Commonly known as:  TOPROL-XL TAKE 1 TABLET ONCE DAILY.   NovoLOG FlexPen 100 UNIT/ML FlexPen Generic drug:  insulin aspart INJECT 16 UNITS EVERY MORNING WITH BREAKFAST, 22 UNITS AT LUNCH AND 28 UNITS AT SUPPER.   omeprazole 20 MG capsule Commonly known as:  PRILOSEC TAKE (1) CAPSULE DAILY.   OXYGEN Inhale 3 L into the lungs continuous.   pravastatin 20 MG tablet Commonly known as:  PRAVACHOL Take 1 tablet (20 mg total) by mouth daily.   predniSONE 10 MG tablet Commonly known as:  DELTASONE Take 1 tablet (10 mg total) by mouth daily with breakfast. What changed:  Another medication with the same name was removed. Continue taking this medication, and follow the directions you see here. Changed by:  [name], MD   promethazine 25 MG tablet Commonly known as:  PHENERGAN Take 1 tablet (25 mg total) by mouth every 6 (six) hours as needed for nausea or vomiting.   sodium chloride HYPERTONIC 3 % nebulizer solution 1 ampule neb every 6 hours if needed to clear mucus   trimethoprim 100 MG tablet Commonly known as:  TRIMPEX   Vitamin D (Ergocalciferol) 1.25 MG (50000 UT) Caps capsule Commonly known as:  DRISDOL TAKE 1 CAPSULE BY MOUTH EVERY WEEK    Xopenex HFA 45 MCG/ACT inhaler Generic drug:  levalbuterol INHALE 2 PUFFS EVERY 6 HOURS IF NEEDED FOR WHEEZING OR SHORTNESS OF BREATH.       Allergies:  Allergies  Allergen Reactions  . Chlordiazepoxide-Clidinium Other (See Comments)    Sleepy,weak  . Biaxin [Clarithromycin] Other (See Comments)    Foul taste, abd pain, diarrhea  . Tramadol Other (See Comments)    Caused tremors  . Codeine Other (See Comments)    REACTION: gi upset- only in high doses per pt.  Can tolerate cough syrup with codeine.     Past Medical History:  Diagnosis Date  . Allergic rhinitis, cause unspecified    Sinus CT Rec 12-23-2009  . Arthritis   . Benign paroxysmal positional vertigo 08/09/2012  . 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 Complication of anesthesia    takes along time to wake up  . Diabetes mellitus   . Diarrhea   . Diverticulosis   . Esophageal reflux   . Esophageal stricture   . GERD (gastroesophageal reflux disease)   . Glaucoma   . Hiatal hernia   . Hypertension   . Irritable bowel syndrome   . Neuropathy in diabetes ([place]) 08/09/2012  . Other chronic nonalcoholic liver disease   . Oxygen deficiency   . Sciatica   . Scoliosis   . Shingles 2010  . Shortness of breath   . Steatohepatitis     Past Surgical History:  Procedure Laterality Date  . APPENDECTOMY    . CARPAL TUNNEL RELEASE     right hand  . CHOLECYSTECTOMY OPEN  1978  . COLONOSCOPY  07-2001   mild diverticulosis  .  ESOPHAGOGASTRODUODENOSCOPY  [id number],[id number]   H Hernia,es.stricture s/p dil 47F  . INTRAOCULAR LENS INSERTION Bilateral   . LIVER BIOPSY  [phone]  . PARATHYROID EXPLORATION    . TONSILLECTOMY AND ADENOIDECTOMY    . TOTAL ABDOMINAL HYSTERECTOMY    . ULNAR NERVE TRANSPOSITION  12/07/2011   Procedure: ULNAR NERVE DECOMPRESSION/TRANSPOSITION;this was cancelled-not done  Surgeon: Cammie Sickle., MD;  Location: [facility];  Service:  Orthopedics;  Laterality: Right;  right ulnar nerve in situ decompression  . ULNAR TUNNEL RELEASE  03/07/2012   Procedure: CUBITAL TUNNEL RELEASE;  Surgeon: Roseanne Kaufman, MD;  Location: [facility];  Service: Orthopedics;  Laterality: Right;  ulnar nerve release at the elbow      Family History  Problem Relation Age of Onset  . Heart disease Father   . Lung cancer Mother        small cell;Byssinosis  . Lung cancer Sister   . Liver cancer Sister        ? mets from another area of the body  . Diabetes Unknown        grandmother  . Stroke Maternal Grandfather     Social History:  reports that she quit smoking about 30 years ago. Her smoking use included cigarettes. She has never used smokeless tobacco. She reports that she does not drink alcohol or use drugs.  Review of Systems:  DIARRHEA: She has had chronic diarrhea treated with Questran or Colestid, reportedly from irritable bowel Has been to several gastroenterologists Only occasional problem now  HYPERTENSION:   blood pressure is controlled with adding 75 mg of irbesartan Has not checked much at home She thinks blood pressure recently has been fairly good with other physicians   BP Readings from Last 3 Encounters:  05/31/18 (!) 148/68  05/09/18 (!) 125/55  02/25/18 (!) 162/64    She takes Xanax at night as needed for sleep and lorazepam as needed for anxiety  HYPERLIPIDEMIA:  She has had mixed hyperlipidemia, LDL treated with pravastatin 20 mg Last LDL adequately controlled     Lab Results  Component Value Date   CHOL 148 11/06/2017   HDL 52.30 11/06/2017   LDLCALC 75 11/06/2017   LDLDIRECT 89.0 10/14/2014   TRIG 104.0 11/06/2017   CHOLHDL 3 11/06/2017    She is followed by pulmonologist for chronic COPD, using home oxygen Also has recurrent cough  She has numerous musculoskeletal issues  She has had persistent joint pains and back pain is recently worse. She had a recent x-ray and  reportedly has severe scoliosis She is supposed to get another epidural steroid  She did not continue her vitamin D after the first prescription, has not picked up her prescription sent about 5 weeks ago    Lab Results  Component Value Date   VD25OH 19.95 (L) 02/06/2018      Examination:   There were no vitals taken for this visit.  There is no height or weight on file to calculate BMI.     ASSESSMENT/ PLAN:    Diabetes type 2 with obesity, insulin-dependent  See history of present illness for detailed discussion of  current management, blood sugar patterns and problems identified  Last A1c was 8.8 No recent labs available  She has reduced her frequency of glucose monitoring and difficult to get a pattern However most of her fasting readings are high Also recently with her starting prednisone her blood sugars are rising especially nonfasting Her appetite and meal intake  is quite variable making it difficult to adjust her insulin   Recommendations: She will go up 4 units on her [place] Aas for now and another 4 by next week if fasting readings are still more than 140 She will increase her NovoLog temporarily by at least 6 units and may need up to 10 if blood sugars are persistently high She will try to make sure she is checking her sugar 4 times a day Also to take NovoLog based on her meal size once she stops prednisone She will take NovoLog right after mealtime   Weight loss: She reportedly has lost weight but this may be related to recent pain, this needs to be confirmed   HYPERTENSION: Treated with 75 mg of Avapro Reportedly blood pressure has been fairly well recently She will need to start checking at home  Low back pain: She will continue to work with her neurosurgeon for pain management  However does need to have a bone density done evaluation since she is on recurrent prednisone  History of vitamin D deficiency, discussed need to take her supplement  consistently and she will have another prescription sent to her pharmacy that they will deliver to her home   LIPIDS: To have labs on her next visit but meanwhile continue pravastatin  Total visit duration on telephone encounter =15 minutes   There are no Patient Instructions on file for this visit.    [name] 09/13/2018, 9:27 AM

## 2018-09-19 ENCOUNTER — Telehealth: Payer: Self-pay | Admitting: *Deleted

## 2018-09-19 NOTE — Telephone Encounter (Signed)
Please advise Dr Sharol Given, patient has not been seen yet for [facility]  for shoulder pain and wants to know if there's anything else other than the Prednisone  that she can take until she gets this done by Dr Ernestina Patches. Thank you

## 2018-09-19 NOTE — Telephone Encounter (Signed)
Try aleve 2 po bid prn pain, stop if she had gi symptons

## 2018-09-20 NOTE — Telephone Encounter (Signed)
Pt was called and informed of Dr Jess Barters request and patient understood and stated that she could take only Tylenol due to GI symptoms and concerns and I advised pt that was fine and to apply ice pks in between and elevation for her knee that she stated she bumped going outside. Pt understood and stated she would try this.

## 2018-10-08 ENCOUNTER — Ambulatory Visit (INDEPENDENT_AMBULATORY_CARE_PROVIDER_SITE_OTHER): Payer: Medicare Other | Admitting: Physical Medicine and Rehabilitation

## 2018-10-08 ENCOUNTER — Other Ambulatory Visit: Payer: Self-pay

## 2018-10-08 VITALS — BP 156/81 | HR 99 | Ht 63.0 in | Wt 180.0 lb

## 2018-10-08 DIAGNOSIS — M5416 Radiculopathy, lumbar region: Secondary | ICD-10-CM | POA: Diagnosis not present

## 2018-10-08 DIAGNOSIS — M47812 Spondylosis without myelopathy or radiculopathy, cervical region: Secondary | ICD-10-CM | POA: Diagnosis not present

## 2018-10-08 DIAGNOSIS — M419 Scoliosis, unspecified: Secondary | ICD-10-CM | POA: Diagnosis not present

## 2018-10-08 DIAGNOSIS — M542 Cervicalgia: Secondary | ICD-10-CM | POA: Diagnosis not present

## 2018-10-08 DIAGNOSIS — M7918 Myalgia, other site: Secondary | ICD-10-CM

## 2018-10-08 DIAGNOSIS — M48062 Spinal stenosis, lumbar region with neurogenic claudication: Secondary | ICD-10-CM

## 2018-10-08 DIAGNOSIS — M47816 Spondylosis without myelopathy or radiculopathy, lumbar region: Secondary | ICD-10-CM | POA: Diagnosis not present

## 2018-10-08 NOTE — Progress Notes (Signed)
.  Numeric Pain Rating Scale and Functional Assessment Average Pain 10 Pain Right Now 8 My pain is constant, sharp and stabbing Pain is worse with: walking and some activites Pain improves with: medication   In the last MONTH (on 0-10 scale) has pain interfered with the following?  1. General activity like being  able to carry out your everyday physical activities such as walking, climbing stairs, carrying groceries, or moving a chair?  Rating(9)  2. Relation with others like being able to carry out your usual social activities and roles such as  activities at home, at work and in your community. Rating(9)  3. Enjoyment of life such that you have  been bothered by emotional problems such as feeling anxious, depressed or irritable?  Rating(5)

## 2018-10-13 ENCOUNTER — Other Ambulatory Visit: Payer: Self-pay | Admitting: Internal Medicine

## 2018-10-14 ENCOUNTER — Telehealth: Payer: Self-pay | Admitting: Internal Medicine

## 2018-10-14 ENCOUNTER — Other Ambulatory Visit: Payer: Self-pay | Admitting: Internal Medicine

## 2018-10-14 MED ORDER — PROMETHAZINE-CODEINE 6.25-10 MG/5ML PO SYRP: 5.0000 mL | ORAL_SOLUTION | Freq: Four times a day (QID) | ORAL | 0 refills | Status: DC | PRN

## 2018-10-14 NOTE — Telephone Encounter (Signed)
I will send in some codeine cough syrup. She has told us before that she can take this.  Would she like to have some prednisone for a few days, maybe 10 mg daily x 5 days? It will raise her blood sugar temporarily.

## 2018-10-14 NOTE — Telephone Encounter (Signed)
Attempted to call pt but unable to reach left message for pt to return call.  Routing back to CY letting him know that I was unable to reach pt to ask her about the prednisone.

## 2018-10-14 NOTE — Telephone Encounter (Signed)
Called and spoke with pt who stated she has been coughing x1 week and pt states she has been coughing up clear mucus and also has a lot of postnasal drainage. Pt stated this morning she did begin coughing up yellow phlegm.  Due to the COPD, pt states she has been wheezing.   Pt denies any complaints of fever or chills.  Pt does have body aches but this is nothing new for her due to her back.  Pt is requesting to have something called in to help with her cough. Dr. Annamaria Boots, please advise on this for pt. Thanks!  Allergies  Allergen Reactions  . Chlordiazepoxide-Clidinium Other (See Comments)    Sleepy,weak  . Biaxin [Clarithromycin] Other (See Comments)    Foul taste, abd pain, diarrhea  . Tramadol Other (See Comments)    Caused tremors  . Codeine Other (See Comments)    REACTION: gi upset- only in high doses per pt.  Can tolerate cough syrup with codeine.      Current Outpatient Medications:  .  ACCU-CHEK SOFTCLIX LANCETS lancets, 1 each by Other route daily. Use as instructed to check blood sugar 8 times daily., Disp: 250 each, Rfl: 3 .  acetaminophen (TYLENOL) 500 MG tablet, Take 1,000 mg by mouth every 6 (six) hours as needed for headache (pain). , Disp: , Rfl:  .  ALPRAZolam (XANAX) 0.5 MG tablet, TAKE 1/2 TO 1 TABLET TWICE DAILY AS NEEDED FOR ANXIETY., Disp: 60 tablet, Rfl: 0 .  aspirin 325 MG tablet, Take 325 mg by mouth daily.  , Disp: , Rfl:  .  B-D UF III MINI PEN NEEDLES 31G X 5 MM MISC, USE 5 PER DAY TO INJECT INSULIN, Disp: 200 each, Rfl: 3 .  budesonide-formoterol (SYMBICORT) 160-4.5 MCG/ACT inhaler, Inhale 2 puffs, then rinse mouth, twice daily, Disp: 1 Inhaler, Rfl: 6 .  budesonide-formoterol (SYMBICORT) 160-4.5 MCG/ACT inhaler, Inhale 2 puffs into the lungs 2 (two) times daily., Disp: 1 Inhaler, Rfl: 0 .  chlorpheniramine (CHLOR-TRIMETON) 4 MG tablet, Take 1 tablet (4 mg total) by mouth every 4 (four) hours as needed for allergies., Disp: 30 tablet, Rfl: 1 .   cholestyramine (QUESTRAN) 4 g packet, 1 scoop/pack twice a day as needed., Disp: 60 each, Rfl: 3 .  dicyclomine (BENTYL) 10 MG capsule, Take 1 capsule (10 mg total) by mouth 2 (two) times daily., Disp: 60 capsule, Rfl: 11 .  doxycycline (VIBRA-TABS) 100 MG tablet, Take 2 tabs first day, then 1 tab daily till gone, Disp: 8 tablet, Rfl: 0 .  glucose blood (ACCU-CHEK AVIVA PLUS) test strip, USE AS INSTRUCTED TO CHECK BLOOD SUGARS 8 TIMES PER DAY. DX:E11.65, Disp: 250 each, Rfl: 2 .  Insulin Degludec (TRESIBA FLEXTOUCH) 200 UNIT/ML SOPN, Inject 60 Units into the skin daily., Disp: 18 mL, Rfl: 3 .  Insulin Pen Needle (B-D ULTRAFINE III SHORT PEN) 31G X 8 MM MISC, TEST 3 TIMES A DAY, Disp: 100 each, Rfl: 5 .  Insulin Syringe-Needle U-100 (BD INSULIN SYRINGE ULTRAFINE) 31G X 15/64" 0.5 ML MISC, Use 2 per day dx code E11.65, Disp: 100 each, Rfl: 5 .  ipratropium-albuterol (DUONEB) 0.5-2.5 (3) MG/3ML SOLN, 1 vial in neb every 8 hours and as needed, Disp: 90 mL, Rfl: 11 .  irbesartan (AVAPRO) 75 MG tablet, Take 1 tablet (75 mg total) by mouth daily., Disp: 30 tablet, Rfl: 3 .  Lancets Misc. (ACCU-CHEK SOFTCLIX LANCET DEV) KIT, 1 each by Does not apply route daily. Use as instructed to check blood  sugar 8 times daily., Disp: 1 kit, Rfl: 0 .  magic mouthwash SOLN, Swish with 2 teaspoonfuls by mouth as needed (Patient taking differently: daily as needed. Swish with 2 teaspoonfuls by mouth as needed), Disp: 240 mL, Rfl: 3 .  meclizine (ANTIVERT) 25 MG tablet, take 1 tablet by mouth three times a day if needed for dizziness/vertigo (Patient taking differently: daily as needed. take 1 tablet by mouth three times a day if needed for dizziness/vertigo), Disp: 30 tablet, Rfl: 3 .  methenamine (MANDELAMINE) 0.5 GM tablet, , Disp: , Rfl:  .  metoprolol succinate (TOPROL-XL) 25 MG 24 hr tablet, TAKE 1 TABLET ONCE DAILY., Disp: 90 tablet, Rfl: 0 .  NOVOLOG FLEXPEN 100 UNIT/ML FlexPen, INJECT 16 UNITS EVERY MORNING WITH  BREAKFAST, 22 UNITS AT LUNCH AND 28 UNITS AT SUPPER., Disp: 30 mL, Rfl: 0 .  omeprazole (PRILOSEC) 20 MG capsule, TAKE (1) CAPSULE DAILY., Disp: 90 capsule, Rfl: 0 .  OXYGEN, Inhale 3 L into the lungs continuous. , Disp: , Rfl:  .  pravastatin (PRAVACHOL) 20 MG tablet, Take 1 tablet (20 mg total) by mouth daily., Disp: 90 tablet, Rfl: 5 .  predniSONE (DELTASONE) 10 MG tablet, Take 1 tablet (10 mg total) by mouth daily with breakfast., Disp: 30 tablet, Rfl: 3 .  promethazine (PHENERGAN) 25 MG tablet, Take 1 tablet (25 mg total) by mouth every 6 (six) hours as needed for nausea or vomiting., Disp: 30 tablet, Rfl: 0 .  sodium chloride HYPERTONIC 3 % nebulizer solution, 1 ampule neb every 6 hours if needed to clear mucus, Disp: 75 mL, Rfl: 12 .  Spacer/Aero-Holding Chambers (AEROCHAMBER MV) inhaler, Use as instructed, Disp: 1 each, Rfl: 0 .  STIOLTO RESPIMAT 2.5-2.5 MCG/ACT AERS, USE 2 PUFFS DAILY, Disp: 4 g, Rfl: 6 .  trimethoprim (TRIMPEX) 100 MG tablet, , Disp: , Rfl:  .  Vitamin D, Ergocalciferol, (DRISDOL) 1.25 MG (50000 UT) CAPS capsule, Take 1 capsule (50,000 Units total) by mouth once a week., Disp: 4 capsule, Rfl: 3 .  XOPENEX HFA 45 MCG/ACT inhaler, INHALE 2 PUFFS EVERY 6 HOURS IF NEEDED FOR WHEEZING OR SHORTNESS OF BREATH., Disp: 15 g, Rfl: 2

## 2018-10-15 NOTE — Telephone Encounter (Signed)
Called and spoke with pt letting her know the information stated by CY and pt verbalized understanding. Nothing further needed.

## 2018-10-15 NOTE — Telephone Encounter (Signed)
Coughing will take her breath. She can use her nebulizer machine when this happens. Weather changes and simple triggers like that can make some days worse than others. Reflux is often a trigger for cough, and will go and come. She doesn't have to feel it as heart burn. We usually try to have people sleep with head of bed elevated, avoid lying down or bending over with food in stomach

## 2018-10-15 NOTE — Telephone Encounter (Signed)
ATC again line still busy

## 2018-10-15 NOTE — Telephone Encounter (Signed)
ATC x 2 and line busy

## 2018-10-15 NOTE — Telephone Encounter (Signed)
Called and spoke with pt in regards to the information stated by CY. Pt  that she is currently taking an OTC cough syrup called cough relief which she states has helped her cough some and at this time will hold off on the codeine cough syrup as the last few times she was on it, she does not recall it helping with her cough.  Pt stated that we could send in a prednisone Rx but she is unsure if it would really help with her cough.  Due to the fact that pt's cough goes away and then keeps coming back, pt is wanting to know if CY has any idea to what is possibly causing her cough to keep coming back like it is. She states that she knows she has COPD but is wanting to know if CY has any idea to why she keeps coughing like she does.  Pt stated that at times when she is coughing, she becomes so SOB from it. Dr. Annamaria Boots, please advise on this. Thanks!

## 2018-10-15 NOTE — Telephone Encounter (Signed)
Pt returning call and can be reached @ [phone].Hillery Hunter

## 2018-10-22 ENCOUNTER — Ambulatory Visit: Payer: Self-pay

## 2018-10-22 ENCOUNTER — Encounter: Payer: Self-pay | Admitting: Physical Medicine and Rehabilitation

## 2018-10-22 ENCOUNTER — Other Ambulatory Visit: Payer: Self-pay

## 2018-10-22 ENCOUNTER — Ambulatory Visit (INDEPENDENT_AMBULATORY_CARE_PROVIDER_SITE_OTHER): Payer: Medicare Other | Admitting: Physical Medicine and Rehabilitation

## 2018-10-22 VITALS — BP 163/80 | HR 95

## 2018-10-22 DIAGNOSIS — M5416 Radiculopathy, lumbar region: Secondary | ICD-10-CM

## 2018-10-22 NOTE — Progress Notes (Signed)
 .  Numeric Pain Rating Scale and Functional Assessment Average Pain 6   In the last MONTH (on 0-10 scale) has pain interfered with the following?  1. General activity like being  able to carry out your everyday physical activities such as walking, climbing stairs, carrying groceries, or moving a chair?  Rating(9)   +Driver, -BT, -Dye Allergies.

## 2018-10-24 ENCOUNTER — Encounter: Payer: Self-pay | Admitting: Physical Medicine and Rehabilitation

## 2018-10-24 NOTE — Progress Notes (Signed)
[name] - 83 y.o. female MRN [id number]  Date of birth: 11-Jul-1935  Office Visit Note: Visit Date: 10/08/2018 PCP: [name], MD Referred by: [name], MD  Subjective: Chief Complaint  Patient presents with   Left Leg - Pain   Lower Back - Pain   Neck - Pain   Right Shoulder - Pain   Left Shoulder - Pain   HPI: [name] is a 83 y.o. female who comes in today At the request of Dr. Meridee Score for evaluation management of mainly low back pain and left radicular type leg pain but also neck pain and bilateral shoulder pain.  Patient describes many years of chronic pain with average pain of 10 out of 10 despite telling her that 10 out of 10 is essentially being shot with a guy and she said that yes it is 10 out of 10 most of the time but right now it is maybe an 8 out of 10.  She reports a constant sharp and stabbing pain of the lower back which is worse with walking and doing activities and radiates into the left leg and can go all the way down to the foot in a somewhat nondermatomal pattern somewhat posterior lateral.  She reports the leg pain started maybe a month ago becoming more severe but the low back pain is present constantly.  She has been using some Tylenol but basically says otherwise medication has never really helped.  She has had opioid medication in the past and does not tolerate tramadol or codeine.  She is not had any focal weakness although she feels weak in general.  She has a host of medical problems with uncontrolled type 2 diabetes with hemoglobin A1c is above 8.  She has pretty significant COPD and is on oxygen.  She has had therapy in the past but not recently and does not feel like that really helped.  This does really affect her activities of daily living.  In terms of her neck pain she has chronic axial neck pain worse with extension and rotation no real radicular complaints but some pain into the shoulders.  Some hand pain in general.  She has not  had electrodiagnostic studies.  She has been treated for her back most recently by [facility].  She reports that she never really saw Dr. Rennis Harding but it did sound like she saw Dr. Maia Petties who is a physiatry [place] there.  We do not have their notes for review.  She has had MRI imaging of both the lower back and cervical spine just last year.  These were both ordered by Dr. Rennis Harding.  These were reviewed with the patient and reviewed below.  She basically tells me that there is nothing they can do for her back and nothing has ever helps.  She had injections by Dr. Maia Petties that were ineffectual.  She talks about how severe her spine is but when you look at the MRI she really has been mild scoliosis she does have some degenerative change and moderate stenosis at L3-4 but no high-grade nerve compression or disc herniation etc.  Cervical spine is also spondylitic but without any central stenosis or listhesis.  Review of Systems  Constitutional: Positive for malaise/fatigue. Negative for chills, fever and weight loss.  HENT: Negative for hearing loss and sinus pain.   Eyes: Negative for blurred vision, double vision and photophobia.  Respiratory: Negative for cough and shortness of breath.  Cardiovascular: Negative for chest pain, palpitations and leg swelling.  Gastrointestinal: Negative for abdominal pain, nausea and vomiting.  Genitourinary: Negative for flank pain.  Musculoskeletal: Positive for back pain, joint pain and neck pain. Negative for myalgias.  Skin: Negative for itching and rash.  Neurological: Positive for weakness. Negative for tremors and focal weakness.  Endo/Heme/Allergies: Negative.   Psychiatric/Behavioral: Negative for depression.  All other systems reviewed and are negative.  Otherwise per HPI.  Assessment & Plan: Visit Diagnoses:  1. Spinal stenosis of lumbar region with neurogenic claudication   2. Lumbar radiculopathy   3. Spondylosis without myelopathy  or radiculopathy, lumbar region   4. Scoliosis of lumbar spine, unspecified scoliosis type   5. Cervicalgia   6. Cervical spondylosis without myelopathy   7. Myofascial pain syndrome     Plan: Findings:  1.  Low back and radicular leg pain seems to be consistent with lumbar stenosis at L3-4 which is moderate.  There is no high-grade foraminal narrowing.  I think a lot of her back pain is due to the arthritis as well.  It sounds like from talking with her that Dr. Maia Petties may have completed diagnostic medial branch blocks and sometimes is just a very difficult thing to get through to a patient especially is very headstrong like Mrs. Slappey.  They are expecting long-term relief in your try to do a medial branch block and just see if even for a little while it seems to help.  I will try to get the notes from them to see exactly what was done.  I think for the radicular pain an epidural injection at L3-4 either from a transforaminal route or interlaminar would go a long way and hopefully helping her leg pain may be her back pain.  She does want to try something.  She actually seems to be in a lot more pain than you would expect from her MRI imaging.  A lot of this I think is Sharol Given chronic medical problems and there is some probably depression anxiety underlay.  She does take Xanax.  She could do well with Cymbalta/duloxetine.  She does have diabetic neuropathy.  2.  Neck pain I believe is multifactorial with myofascial pain as well as facet arthropathy.  I do not think she is really getting much in the way of pain from the moderate foraminal narrowing and there is no significant canal stenosis.  I do not think she has had injections of the cervical spine at all.  She probably would do well with physical therapy for her neck.  She has underlying diagnoses of irritable bowel syndrome etc. and I think there is probably a centralized anxiety driven pain syndrome involved as well.    Meds & Orders: No orders of the  defined types were placed in this encounter.  No orders of the defined types were placed in this encounter.   Follow-up: Return for Epidural injection at L3-4.   Procedures: No procedures performed  No notes on file   Clinical History: MRI LUMBAR SPINE WITHOUT CONTRAST  TECHNIQUE: Multiplanar, multisequence MR imaging of the lumbar spine was performed. No intravenous contrast was administered.  COMPARISON: CT abdomen and pelvis 11/08/2016  FINDINGS: Segmentation: Standard.  Alignment: Straightening of the normal lumbar lordosis. Mild lumbar dextroscoliosis. No significant listhesis in the sagittal plane.  Vertebrae: No fracture or suspicious osseous lesion. Multiple small Schmorl's nodes. Multilevel degenerative endplate changes, greatest at L3-4 and with at most minimal degenerative edema.  Conus medullaris and cauda  equina: Conus extends to the lower L1 level. Conus and cauda equina appear normal.  Paraspinal and other soft tissues: Unremarkable.  Disc levels:  Disc desiccation throughout the lumbar spine. Moderate disc space narrowing at L3-4 and L5-S1 with milder narrowing at L4-5.  T12-L1: Minimal disc bulging and facet arthrosis without stenosis.  L1-2: Negative.  L2-3: Negative.  L3-4: Circumferential disc bulging and moderate facet and ligamentum flavum hypertrophy result in moderate spinal stenosis, asymmetric left lateral recess stenosis, and moderate left neural foraminal stenosis.  L4-5: Circumferential disc bulging greater to the right and moderate facet hypertrophy result in mild bilateral lateral recess stenosis and moderate right and mild left neural foraminal stenosis without spinal stenosis.  L5-S1: Disc bulging greater to the right and moderate right and mild left facet arthrosis result in minimal right neural foraminal stenosis without spinal stenosis.  IMPRESSION: Multilevel lumbar disc and facet degeneration, most  notable at L3-4 where there is moderate spinal and left neural foraminal stenosis.   Electronically Signed By: Logan Bores M.D. On: 06/21/2017 13:48 ------ MRI CERVICAL SPINE WITHOUT CONTRAST  TECHNIQUE: Multiplanar, multisequence MR imaging of the cervical spine was performed. No intravenous contrast was administered.  COMPARISON:  None.  FINDINGS: The study is moderately motion degraded. Note that the axial sequences accurately cross reference to only the sagittal STIR sequence.  Alignment: Reversal of the normal cervical lordosis. Trace retrolisthesis of C4 on C5, C5 on C6, and C6 on C7.  Vertebrae: No fracture, suspicious osseous lesion, or significant marrow edema. Mild chronic degenerative endplate changes from V7-Q4.  Cord: Normal signal and morphology.  Posterior Fossa, vertebral arteries, paraspinal tissues: Unremarkable.  Disc levels:  C2-3: Mild left facet arthrosis and left uncovertebral spurring without significant stenosis.  C3-4: Disc bulging, uncovertebral spurring, and moderate right and mild left facet arthrosis result in moderate bilateral neural foraminal stenosis without spinal stenosis.  C4-5: Moderate disc space narrowing. Disc bulging, uncovertebral spurring, and mild facet arthrosis result in likely mild bilateral neural foraminal stenosis without spinal stenosis.  C5-6: Mild disc space narrowing. Disc bulging and uncovertebral spurring result in mild-to-moderate right and mild left neural foraminal stenosis without spinal stenosis.  C6-7: Mild disc space narrowing. Disc bulging and uncovertebral spurring result in mild-to-moderate right and mild left neural foraminal stenosis without spinal stenosis.  C7-T1: Mild facet arthrosis without disc herniation or stenosis.  IMPRESSION: 1. Moderate cervical disc degeneration without spinal stenosis. 2. Up to moderate multilevel neural foraminal stenosis as above, with  assessment limited by motion artifact.   Electronically Signed   By: Logan Bores M.D.   On: 06/21/2017 13:41   She reports that she quit smoking about 30 years ago. Her smoking use included cigarettes. She has never used smokeless tobacco.  Recent Labs    02/06/18 1049 05/09/18 1045  HGBA1C 8.2* 8.8*    Objective:  VS:  HT:5' 3"  (160 cm)    WT:180 lb (81.6 kg)   BMI:31.89     BP:(!) 156/81   HR:99bpm   TEMP: ( )   RESP:  Physical Exam Vitals signs and nursing note reviewed.  Constitutional:      General: She is not in acute distress.    Appearance: Normal appearance. She is well-developed.     Comments: Somewhat disheveled appearance  HENT:     Head: Normocephalic and atraumatic.     Right Ear: External ear normal.     Left Ear: External ear normal.     Nose: Nose normal. No congestion  or rhinorrhea.     Mouth/Throat:     Mouth: Mucous membranes are moist.     Pharynx: Oropharynx is clear.  Eyes:     General: Scleral icterus present.     Conjunctiva/sclera: Conjunctivae normal.     Pupils: Pupils are equal, round, and reactive to light.  Neck:     Musculoskeletal: Neck supple. Muscular tenderness present. No neck rigidity.  Cardiovascular:     Rate and Rhythm: Normal rate and regular rhythm.     Pulses: Normal pulses.  Pulmonary:     Effort: No respiratory distress.     Breath sounds: No wheezing.     Comments: Patient on oxygen nasal cannula.  She has some accessory muscles used for inspiration. Abdominal:     General: There is no distension.     Palpations: Abdomen is soft.     Tenderness: There is no guarding or rebound.  Musculoskeletal:     Right lower leg: No edema.     Left lower leg: No edema.     Comments: Patient ambulates with a walker.  She has difficulty going from sit to stand to full extension and pain with facet joint loading.  She is tender across the lower back even to light palpation and sort of a superficial hypersensitivity.  She is like this  over the greater trochanters to but there is no specific pain there.  No pain with hip rotation.  No clonus bilaterally and good focal strength.  Cervical spine is forward flexed pain with rotation left and right limited range of motion negative Spurling's test.  Lymphadenopathy:     Cervical: No cervical adenopathy.  Skin:    General: Skin is warm and dry.     Findings: No erythema or rash.  Neurological:     General: No focal deficit present.     Mental Status: She is alert and oriented to person, place, and time.     Motor: No abnormal muscle tone.     Coordination: Coordination normal.     Gait: Gait normal.  Psychiatric:        Mood and Affect: Mood normal.        Behavior: Behavior normal.        Thought Content: Thought content normal.     Ortho Exam Imaging: No results found.  Past Medical/Family/Surgical/Social History: Medications & Allergies reviewed per EMR, new medications updated. Patient Active Problem List   Diagnosis Date Noted   Scoliosis due to degenerative disease of spine in adult patient 09/10/2018   Radicular pain of left lower extremity 09/10/2018   Diarrhea 11/08/2016   Thrombocytopenia ([place]) 11/08/2016   Degenerative lumbar spinal stenosis 10/27/2015   Arthralgia 09/22/2015   Chronic respiratory failure with hypoxia ([place]) 01/11/2015   Diabetic polyneuropathy associated with type 2 diabetes mellitus ([place]) 11/16/2014   Essential hypertension, benign 05/19/2013   Vertigo 05/11/2013   Dizziness 05/11/2013   Tachycardia 05/11/2013   Lower urinary tract infectious disease 05/11/2013   Sinusitis 05/11/2013   Other and unspecified hyperlipidemia 02/15/2013   Type II diabetes mellitus, uncontrolled ([place]) 01/01/2013   Benign paroxysmal positional vertigo 08/09/2012   Type II or unspecified type diabetes mellitus with neurological manifestations, uncontrolled(250.62) 08/09/2012   GLAUCOMA 03/16/2010   HOARSENESS 03/16/2010   COUGH  12/23/2009   CHEST PAIN 12/23/2009   MUSCULOSKELETAL PAIN 01/28/2009   COPD mixed type ([place]) 01/15/2009   Seasonal and perennial allergic rhinitis 11/23/2007   CHRONIC RHINITIS 07/16/2007   G E R D  05/29/2007   I B S-DIARRHEAL PREDOMINATE 05/29/2007   FATTY LIVER DISEASE 05/29/2007   Past Medical History:  Diagnosis Date   Allergic rhinitis, cause unspecified    Sinus CT Rec 12-23-2009   Arthritis    Benign paroxysmal positional vertigo 08/09/2012   Chronic airway obstruction, not elsewhere classified    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   Complication of anesthesia    takes along time to wake up   Diabetes mellitus    Diarrhea    Diverticulosis    Esophageal reflux    Esophageal stricture    GERD (gastroesophageal reflux disease)    Glaucoma    Hiatal hernia    Hypertension    Irritable bowel syndrome    Neuropathy in diabetes ([place]) 08/09/2012   Other chronic nonalcoholic liver disease    Oxygen deficiency    Sciatica    Scoliosis    Shingles 2010   Shortness of breath    Steatohepatitis    Family History  Problem Relation Age of Onset   Heart disease Father    Lung cancer Mother        small cell;Byssinosis   Lung cancer Sister    Liver cancer Sister        ? mets from another area of the body   Diabetes Unknown        grandmother   Stroke Maternal Grandfather    Past Surgical History:  Procedure Laterality Date   APPENDECTOMY     CARPAL TUNNEL RELEASE     right hand   CHOLECYSTECTOMY OPEN  1978   COLONOSCOPY  07-2001   mild diverticulosis   ESOPHAGOGASTRODUODENOSCOPY  3435,68-61   H Hernia,es.stricture s/p dil 74F   INTRAOCULAR LENS INSERTION Bilateral    LIVER BIOPSY  [id number]   PARATHYROID EXPLORATION     TONSILLECTOMY AND ADENOIDECTOMY     TOTAL ABDOMINAL HYSTERECTOMY     ULNAR NERVE TRANSPOSITION  12/07/2011   Procedure: ULNAR NERVE DECOMPRESSION/TRANSPOSITION;this was cancelled-not done  Surgeon: Cammie Sickle., MD;  Location: [facility];  Service: Orthopedics;  Laterality: Right;  right ulnar nerve in situ decompression   ULNAR TUNNEL RELEASE  03/07/2012   Procedure: CUBITAL TUNNEL RELEASE;  Surgeon: Roseanne Kaufman, MD;  Location: [facility];  Service: Orthopedics;  Laterality: Right;  ulnar nerve release at the elbow     Social History   Occupational History   Occupation: Retired   Tobacco Use   Smoking status: Former Smoker    Types: Cigarettes    Quit date: 05/08/1988    Years since quitting: 30.4   Smokeless tobacco: Never Used  Substance and Sexual Activity   Alcohol use: No    Alcohol/week: 0.0 standard drinks   Drug use: No   Sexual activity: Never

## 2018-11-06 ENCOUNTER — Telehealth: Payer: Self-pay | Admitting: Physical Medicine and Rehabilitation

## 2018-11-06 ENCOUNTER — Other Ambulatory Visit: Payer: Self-pay | Admitting: Endocrinology

## 2018-11-06 NOTE — Telephone Encounter (Signed)
DC instructions say take whatever pain medicine you were already taking. If injection helped at all then could do it 2nd time a different way, but that is all I have to offer. May want to see pain mgt is more appropriate approach. Tyelol extra strength 566m TID to start is good.

## 2018-11-06 NOTE — Telephone Encounter (Signed)
Patient states that she was better the day after her injection. States sciatic nerve on right side is really hurting. She states that the pain in the left leg went away, the pain moved into the right leg, and now both legs are hurting.  She states that she has had about everything on the list we gave her. She states that she now has itching at her ankle and her face. She states that she has no pain killers to take, which she states our discharge instructions suggest. Please advise.

## 2018-11-07 ENCOUNTER — Other Ambulatory Visit: Payer: Self-pay

## 2018-11-07 DIAGNOSIS — M545 Low back pain, unspecified: Secondary | ICD-10-CM

## 2018-11-07 DIAGNOSIS — M25511 Pain in right shoulder: Secondary | ICD-10-CM

## 2018-11-07 NOTE — Telephone Encounter (Signed)
Patient was called and understood to take her Prednisone 20 mg and we will refer her to pain management also. Patient also wanted another appt to be scheduled for appt for cortisone injection for shoulder and stated that her injections with Dr Sharol Given makes her feel better so she is scheduled for 11/14/2018 at 12:45. Patient did complain about her steroid inj and not having any effect and I advise her that she can come in and discuss further procedures with Dr Sharol Given.

## 2018-11-07 NOTE — Telephone Encounter (Signed)
Call patient and see if she has had any relief from taking prednisone she could take 20 mg with breakfast.  Since she had no relief with the steroid injection pain management referral as an option call patient see if she wants to pursue either prednisone or pain management referral

## 2018-11-07 NOTE — Telephone Encounter (Signed)
I called patient to advise as stated below by Dr. Ernestina Patches and to offer a second injection. She does not think another injection would help. See Dr. Romona Curls note regarding possible pain management referral. Patient states the pain is mostly on the right side. Please advise.

## 2018-11-07 NOTE — Telephone Encounter (Signed)
Please see below and advise. Pt declined repeat injection with FN and he suggested possible pain management referral.

## 2018-11-14 ENCOUNTER — Ambulatory Visit (INDEPENDENT_AMBULATORY_CARE_PROVIDER_SITE_OTHER): Payer: Medicare Other | Admitting: Orthopedic Surgery

## 2018-11-14 ENCOUNTER — Encounter: Payer: Self-pay | Admitting: Orthopedic Surgery

## 2018-11-14 ENCOUNTER — Other Ambulatory Visit: Payer: Self-pay

## 2018-11-14 VITALS — Ht 63.0 in | Wt 180.0 lb

## 2018-11-14 DIAGNOSIS — M545 Low back pain, unspecified: Secondary | ICD-10-CM

## 2018-11-14 MED ORDER — PREDNISONE 10 MG PO TABS: 20.0000 mg | ORAL_TABLET | Freq: Every day | ORAL | 0 refills | Status: DC

## 2018-11-14 NOTE — Progress Notes (Signed)
Office Visit Note   Patient: [name]           Date of Birth: [birth year]           MRN: [id number] Visit Date: 11/14/2018              Requested by: [name], MD [address],  [place] [id number] PCP: [name], MD  Chief Complaint  Patient presents with  . Lower Back - Follow-up  . Right Shoulder - Follow-up      HPI: Patient is an 83 year old woman with degenerative disc disease of her lumbar spine with right-sided radicular symptoms.  Patient states she is status post 2 injections in her back she states the left side injection completely relieved her symptoms.  She states the right-sided injection made her feel like she was almost going to die and made her pain more severe.  Patient states she was recently hospitalized for diarrhea from her irritable bowel syndrome.  Assessment & Plan: Visit Diagnoses: No diagnosis found.  Plan: With patient's irritable bowel syndrome I do not think that narcotics would be beneficial.  With her severe pulmonary disease I think her best option is to increase the dose of prednisone to 20 mg with breakfast.  Discussed that if she develops side effects to call us immediately.  Patient is not a surgical candidate.  Follow-Up Instructions: Return in about 2 weeks (around 11/28/2018).   Ortho Exam  Patient is alert, oriented, no adenopathy, well-dressed, normal affect, normal respiratory effort. Examination patient has difficulty getting from a sitting to a standing position she uses a rolling walker she is on nasal cannula FiO2.  She has a positive straight leg raise on the right motor strength is symmetric in both lower extremities she states she is taken half of Xanax at night to help with sleep.  Imaging: No results found. No images are attached to the encounter.  Labs: Lab Results  Component Value Date   HGBA1C 8.8 (A) 05/09/2018   HGBA1C 8.2 (A) 02/06/2018   HGBA1C 9.1 09/05/2017   LABURIC 4.1 11/16/2014    REPTSTATUS 11/09/2016 FINAL 11/08/2016   CULT NO GROWTH 11/08/2016   LABORGA STAPHYLOCOCCUS SPECIES (COAGULASE NEGATIVE) 05/24/2015     Lab Results  Component Value Date   ALBUMIN 4.1 02/06/2018   ALBUMIN 4.0 11/06/2017   ALBUMIN 3.9 05/07/2017   LABURIC 4.1 11/16/2014    Lab Results  Component Value Date   MG 1.6 12/20/2016   MG 1.7 10/18/2015   Lab Results  Component Value Date   VD25OH 19.95 (L) 02/06/2018    No results found for: PREALBUMIN CBC EXTENDED Latest Ref Rng & Units 11/06/2017 05/07/2017 11/10/2016  WBC 4.0 - 10.5 K/uL 11.2(H) 8.5 6.9  RBC 3.87 - 5.11 Mil/uL 4.92 5.15(H) 4.75  HGB 12.0 - 15.0 g/dL 12.8 13.5 12.6  HCT 36.0 - 46.0 % 41.1 43.0 42.4  PLT 150.0 - 400.0 K/uL 181.0 187.0 173  NEUTROABS 1.4 - 7.7 K/uL 6.1 4.8 -  LYMPHSABS 0.7 - 4.0 K/uL 4.2(H) 3.1 -     Body mass index is 31.89 kg/m.  Orders:  No orders of the defined types were placed in this encounter.  No orders of the defined types were placed in this encounter.    Procedures: No procedures performed  Clinical Data: No additional findings.  ROS:  All other systems negative, except as noted in the HPI. Review of Systems  Objective: Vital Signs: Ht 5' 3"  (  1.6 m)   Wt 180 lb (81.6 kg)   BMI 31.89 kg/m   Specialty Comments:  No specialty comments available.  PMFS History: Patient Active Problem List   Diagnosis Date Noted  . Scoliosis due to degenerative disease of spine in adult patient 09/10/2018  . Radicular pain of left lower extremity 09/10/2018  . Diarrhea 11/08/2016  . Thrombocytopenia ([facility]) 11/08/2016  . Degenerative lumbar spinal stenosis 10/27/2015  . Arthralgia 09/22/2015  . Chronic respiratory failure with hypoxia ([facility]) 01/11/2015  . Diabetic polyneuropathy associated with type 2 diabetes mellitus ([place]) 11/16/2014  . Essential hypertension, benign 05/19/2013  . Vertigo 05/11/2013  . Dizziness 05/11/2013  . Tachycardia 05/11/2013  . Lower urinary tract  infectious disease 05/11/2013  . Sinusitis 05/11/2013  . Other and unspecified hyperlipidemia 02/15/2013  . Type II diabetes mellitus, uncontrolled ([place]) 01/01/2013  . Benign paroxysmal positional vertigo 08/09/2012  . Type II or unspecified type diabetes mellitus with neurological manifestations, uncontrolled(250.62) 08/09/2012  . GLAUCOMA 03/16/2010  . HOARSENESS 03/16/2010  . COUGH 12/23/2009  . CHEST PAIN 12/23/2009  . MUSCULOSKELETAL PAIN 01/28/2009  . COPD mixed type ([place]) 01/15/2009  . Seasonal and perennial allergic rhinitis 11/23/2007  . CHRONIC RHINITIS 07/16/2007  . G E R D 05/29/2007  . I B S-DIARRHEAL PREDOMINATE 05/29/2007  . FATTY LIVER DISEASE 05/29/2007   Past Medical History:  Diagnosis Date  . Allergic rhinitis, cause unspecified    Sinus CT Rec 12-23-2009  . Arthritis   . Benign paroxysmal positional vertigo 08/09/2012  . 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 Complication of anesthesia    takes along time to wake up  . Diabetes mellitus   . Diarrhea   . Diverticulosis   . Esophageal reflux   . Esophageal stricture   . GERD (gastroesophageal reflux disease)   . Glaucoma   . Hiatal hernia   . Hypertension   . Irritable bowel syndrome   . Neuropathy in diabetes ([place]) 08/09/2012  . Other chronic nonalcoholic liver disease   . Oxygen deficiency   . Sciatica   . Scoliosis   . Shingles 2010  . Shortness of breath   . Steatohepatitis     Family History  Problem Relation Age of Onset  . Heart disease Father   . Lung cancer Mother        small cell;Byssinosis  . Lung cancer Sister   . Liver cancer Sister        ? mets from another area of the body  . Diabetes Unknown        grandmother  . Stroke Maternal Grandfather     Past Surgical History:  Procedure Laterality Date  . APPENDECTOMY    . CARPAL TUNNEL RELEASE     right hand  . CHOLECYSTECTOMY OPEN  1978  . COLONOSCOPY  07-2001   mild  diverticulosis  . ESOPHAGOGASTRODUODENOSCOPY  4854,62-70   H Hernia,es.stricture s/p dil 66F  . INTRAOCULAR LENS INSERTION Bilateral   . LIVER BIOPSY  [phone]  . PARATHYROID EXPLORATION    . TONSILLECTOMY AND ADENOIDECTOMY    . TOTAL ABDOMINAL HYSTERECTOMY    . ULNAR NERVE TRANSPOSITION  12/07/2011   Procedure: ULNAR NERVE DECOMPRESSION/TRANSPOSITION;this was cancelled-not done  Surgeon: Cammie Sickle., MD;  Location: [facility];  Service: Orthopedics;  Laterality: Right;  right ulnar nerve in situ decompression  . ULNAR TUNNEL RELEASE  03/07/2012   Procedure: CUBITAL TUNNEL RELEASE;  Surgeon:  Roseanne Kaufman, MD;  Location: [facility];  Service: Orthopedics;  Laterality: Right;  ulnar nerve release at the elbow     Social History   Occupational History  . Occupation: Retired   Tobacco Use  . Smoking status: Former Smoker    Types: Cigarettes    Quit date: 05/08/1988    Years since quitting: 30.5  . Smokeless tobacco: Never Used  Substance and Sexual Activity  . Alcohol use: No    Alcohol/week: 0.0 standard drinks  . Drug use: No  . Sexual activity: Never

## 2018-11-18 ENCOUNTER — Other Ambulatory Visit: Payer: Self-pay | Admitting: Endocrinology

## 2018-11-18 ENCOUNTER — Telehealth: Payer: Self-pay | Admitting: Internal Medicine

## 2018-11-18 NOTE — Telephone Encounter (Signed)
Only need to wear the mask if close to other people.  Oxygen has no protective effect. The combination of lung disease, diabetes and age puts her in the group most at risk for dangerous complications from Covid. She needs to stay away from people if at all possible, and to wear mask over oxygen if contact with people is unavoidable.

## 2018-11-18 NOTE — Telephone Encounter (Signed)
Patient states its hard to wear a mask and oxygen at the same time. She wants Dr. Bertrum Sol opinion on what she can do so its easier for her to breathe. He nose is stopped up and she starts breathing through her mouth instead of her nose. But its still hard for her to breathe.    She was told by a few people she didn't have to wear the mask with her oxygen and she wants to know does she have to wear the mask with the Oxygen?  Let patient aware Dr. Annamaria Boots is gone for the day (11/18/18) and she is okay with waiting until 11/19/18 for a response Dr. Annamaria Boots please advise

## 2018-11-19 NOTE — Telephone Encounter (Signed)
LMOMTCB x 1 

## 2018-11-20 NOTE — Telephone Encounter (Signed)
Called & spoke w/ pt regarding CY's recommendations. Pt verbalized understanding with no additional questions or concerns. Nothing further needed at this time.

## 2018-11-20 NOTE — Telephone Encounter (Signed)
Pt is calling back [phone]

## 2018-11-20 NOTE — Telephone Encounter (Signed)
Attempted to call pt but unable to reach.left message for pt to return call. 

## 2018-11-28 ENCOUNTER — Other Ambulatory Visit: Payer: Self-pay | Admitting: Endocrinology

## 2018-11-28 ENCOUNTER — Other Ambulatory Visit: Payer: Self-pay | Admitting: Physician Assistant

## 2018-11-28 ENCOUNTER — Other Ambulatory Visit: Payer: Self-pay | Admitting: Internal Medicine

## 2018-12-02 ENCOUNTER — Encounter: Payer: Self-pay | Admitting: Orthopedic Surgery

## 2018-12-02 ENCOUNTER — Ambulatory Visit (INDEPENDENT_AMBULATORY_CARE_PROVIDER_SITE_OTHER): Payer: Medicare Other | Admitting: Orthopedic Surgery

## 2018-12-02 VITALS — Ht 63.0 in | Wt 180.0 lb

## 2018-12-02 DIAGNOSIS — M4150 Other secondary scoliosis, site unspecified: Secondary | ICD-10-CM | POA: Diagnosis not present

## 2018-12-02 DIAGNOSIS — M4180 Other forms of scoliosis, site unspecified: Secondary | ICD-10-CM

## 2018-12-02 DIAGNOSIS — M5410 Radiculopathy, site unspecified: Secondary | ICD-10-CM

## 2018-12-02 NOTE — Progress Notes (Signed)
Office Visit Note   Patient: [name]           Date of Birth: 09-17-1935           MRN: [id number] Visit Date: 12/02/2018              Requested by: [name], MD [address] [place],  [place] [id number] PCP: [name], MD  Chief Complaint  Patient presents with  . Lower Back - Follow-up      HPI: Patient is an 83 year old woman who presents in follow-up for degenerative disc disease of her lumbar spine with a degenerative scoliosis.  Patient states that her injection with Dr. Ernestina Patches almost killed her.  She states that the prednisone taking 20 mg at a time made her lightheaded she states she is taking it twice a day now and this feels better.  She states this does help decrease her pain and increase her activity.  Patient states the sciatic pain comes and goes.  Assessment & Plan: Visit Diagnoses: No diagnosis found.  Plan: Recommended that she follow-up with her primary care physician to best control her diabetes while taking the prednisone.  I agree with patient's idea to move into assisted living.  She may eventually require skilled care.  Follow-Up Instructions: Return in about 4 weeks (around 12/30/2018).   Ortho Exam  Patient is alert, oriented, no adenopathy, well-dressed, normal affect, normal respiratory effort. Examination patient uses a rolling walker she is on nasal cannula FiO2 she has no focal motor weakness in either lower extremity no sciatic symptoms at this time.  Imaging: No results found. No images are attached to the encounter.  Labs: Lab Results  Component Value Date   HGBA1C 8.8 (A) 05/09/2018   HGBA1C 8.2 (A) 02/06/2018   HGBA1C 9.1 09/05/2017   LABURIC 4.1 11/16/2014   REPTSTATUS 11/09/2016 FINAL 11/08/2016   CULT NO GROWTH 11/08/2016   LABORGA STAPHYLOCOCCUS SPECIES (COAGULASE NEGATIVE) 05/24/2015     Lab Results  Component Value Date   ALBUMIN 4.1 02/06/2018   ALBUMIN 4.0 11/06/2017   ALBUMIN 3.9 05/07/2017   LABURIC 4.1 11/16/2014    Lab Results  Component Value Date   MG 1.6 12/20/2016   MG 1.7 10/18/2015   Lab Results  Component Value Date   VD25OH 19.95 (L) 02/06/2018    No results found for: PREALBUMIN CBC EXTENDED Latest Ref Rng & Units 11/06/2017 05/07/2017 11/10/2016  WBC 4.0 - 10.5 K/uL 11.2(H) 8.5 6.9  RBC 3.87 - 5.11 Mil/uL 4.92 5.15(H) 4.75  HGB 12.0 - 15.0 g/dL 12.8 13.5 12.6  HCT 36.0 - 46.0 % 41.1 43.0 42.4  PLT 150.0 - 400.0 K/uL 181.0 187.0 173  NEUTROABS 1.4 - 7.7 K/uL 6.1 4.8 -  LYMPHSABS 0.7 - 4.0 K/uL 4.2(H) 3.1 -     Body mass index is 31.89 kg/m.  Orders:  No orders of the defined types were placed in this encounter.  No orders of the defined types were placed in this encounter.    Procedures: No procedures performed  Clinical Data: No additional findings.  ROS:  All other systems negative, except as noted in the HPI. Review of Systems  Objective: Vital Signs: Ht 5' 3"  (1.6 m)   Wt 180 lb (81.6 kg)   BMI 31.89 kg/m   Specialty Comments:  No specialty comments available.  PMFS History: Patient Active Problem List   Diagnosis Date Noted  . Scoliosis due to degenerative disease of spine in adult  patient 09/10/2018  . Radicular pain of left lower extremity 09/10/2018  . Diarrhea 11/08/2016  . Thrombocytopenia ([place]) 11/08/2016  . Degenerative lumbar spinal stenosis 10/27/2015  . Arthralgia 09/22/2015  . Chronic respiratory failure with hypoxia ([place]) 01/11/2015  . Diabetic polyneuropathy associated with type 2 diabetes mellitus ([place]) 11/16/2014  . Essential hypertension, benign 05/19/2013  . Vertigo 05/11/2013  . Dizziness 05/11/2013  . Tachycardia 05/11/2013  . Lower urinary tract infectious disease 05/11/2013  . Sinusitis 05/11/2013  . Other and unspecified hyperlipidemia 02/15/2013  . Type II diabetes mellitus, uncontrolled ([place]) 01/01/2013  . Benign paroxysmal positional vertigo 08/09/2012  . Type II or unspecified type diabetes  mellitus with neurological manifestations, uncontrolled(250.62) 08/09/2012  . GLAUCOMA 03/16/2010  . HOARSENESS 03/16/2010  . COUGH 12/23/2009  . CHEST PAIN 12/23/2009  . MUSCULOSKELETAL PAIN 01/28/2009  . COPD mixed type ([place] [place]) 01/15/2009  . Seasonal and perennial allergic rhinitis 11/23/2007  . CHRONIC RHINITIS 07/16/2007  . G E R D 05/29/2007  . I B S-DIARRHEAL PREDOMINATE 05/29/2007  . FATTY LIVER DISEASE 05/29/2007   Past Medical History:  Diagnosis Date  . Allergic rhinitis, cause unspecified    Sinus CT Rec 12-23-2009  . Arthritis   . Benign paroxysmal positional vertigo 08/09/2012  . 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 Complication of anesthesia    takes along time to wake up  . Diabetes mellitus   . Diarrhea   . Diverticulosis   . Esophageal reflux   . Esophageal stricture   . GERD (gastroesophageal reflux disease)   . Glaucoma   . Hiatal hernia   . Hypertension   . Irritable bowel syndrome   . Neuropathy in diabetes ([place]) 08/09/2012  . Other chronic nonalcoholic liver disease   . Oxygen deficiency   . Sciatica   . Scoliosis   . Shingles 2010  . Shortness of breath   . Steatohepatitis     Family History  Problem Relation Age of Onset  . Heart disease Father   . Lung cancer Mother        small cell;Byssinosis  . Lung cancer Sister   . Liver cancer Sister        ? mets from another area of the body  . Diabetes Unknown        grandmother  . Stroke Maternal Grandfather     Past Surgical History:  Procedure Laterality Date  . APPENDECTOMY    . CARPAL TUNNEL RELEASE     right hand  . CHOLECYSTECTOMY OPEN  1978  . COLONOSCOPY  07-2001   mild diverticulosis  . ESOPHAGOGASTRODUODENOSCOPY  0737,10-62   H Hernia,es.stricture s/p dil 31F  . INTRAOCULAR LENS INSERTION Bilateral   . LIVER BIOPSY  [id number]  . PARATHYROID EXPLORATION    . TONSILLECTOMY AND ADENOIDECTOMY    . TOTAL ABDOMINAL HYSTERECTOMY     . ULNAR NERVE TRANSPOSITION  12/07/2011   Procedure: ULNAR NERVE DECOMPRESSION/TRANSPOSITION;this was cancelled-not done  Surgeon: Cammie Sickle., MD;  Location: [facility];  Service: Orthopedics;  Laterality: Right;  right ulnar nerve in situ decompression  . ULNAR TUNNEL RELEASE  03/07/2012   Procedure: CUBITAL TUNNEL RELEASE;  Surgeon: Roseanne Kaufman, MD;  Location: [facility];  Service: Orthopedics;  Laterality: Right;  ulnar nerve release at the elbow     Social History   Occupational History  . Occupation: Retired   Tobacco Use  . Smoking status: Former Smoker  Types: Cigarettes    Quit date: 05/08/1988    Years since quitting: 30.5  . Smokeless tobacco: Never Used  Substance and Sexual Activity  . Alcohol use: No    Alcohol/week: 0.0 standard drinks  . Drug use: No  . Sexual activity: Never

## 2018-12-03 ENCOUNTER — Other Ambulatory Visit: Payer: Self-pay

## 2018-12-06 NOTE — Progress Notes (Signed)
[name] - 83 y.o. female MRN [id number]  Date of birth: [birth year]  Office Visit Note: Visit Date: 10/22/2018 PCP: [name], MD Referred by: [name], MD  Subjective: Chief Complaint  Patient presents with  . Lower Back - Pain  . Right Leg - Pain  . Left Leg - Pain   HPI:  [name] is a 83 y.o. female who comes in today For planned bilateral L3 transforaminal epidural steroid injection.  Please see our prior evaluation and management note for further details and justification.  ROS Otherwise per HPI.  Assessment & Plan: Visit Diagnoses:  1. Lumbar radiculopathy     Plan: No additional findings.   Meds & Orders: No orders of the defined types were placed in this encounter.   Orders Placed This Encounter  Procedures  . XR C-ARM NO REPORT  . Epidural Steroid injection    Follow-up: No follow-ups on file.   Procedures: No procedures performed  Lumbosacral Transforaminal Epidural Steroid Injection - Sub-Pedicular Approach with Fluoroscopic Guidance  Patient: [name]      Date of Birth: 02/18/1936 MRN: [id number] PCP: [name], MD      Visit Date: 10/22/2018   Universal Protocol:    Date/Time: 10/22/2018  Consent Given By: the patient  Position: PRONE  Additional Comments: Vital signs were monitored before and after the procedure. Patient was prepped and draped in the usual sterile fashion. The correct patient, procedure, and site was verified.   Injection Procedure Details:  Procedure Site One Meds Administered: No orders of the defined types were placed in this encounter.   Laterality: Bilateral  Location/Site:  L3-L4  Needle size: 22 G  Needle type: Spinal  Needle Placement: Transforaminal  Findings:    -Comments: Excellent flow of contrast into the epidural space.  Procedure Details: After squaring off the end-plates to get a true AP view, the C-arm was positioned so that an oblique view of the foramen as  noted above was visualized. The target area is just inferior to the "nose of the scotty dog" or sub pedicular. The soft tissues overlying this structure were infiltrated with 2-3 ml. of 1% Lidocaine without Epinephrine.  The spinal needle was inserted toward the target using a "trajectory" view along the fluoroscope beam.  Under AP and lateral visualization, the needle was advanced so it did not puncture dura and was located close the 6 O'Clock position of the pedical in AP tracterory. Biplanar projections were used to confirm position. Aspiration was confirmed to be negative for CSF and/or blood. A 1-2 ml. volume of Isovue-250 was injected and flow of contrast was noted at each level. Radiographs were obtained for documentation purposes.   After attaining the desired flow of contrast documented above, a 0.5 to 1.0 ml test dose of 0.25% Marcaine was injected into each respective transforaminal space.  The patient was observed for 90 seconds post injection.  After no sensory deficits were reported, and normal lower extremity motor function was noted,   the above injectate was administered so that equal amounts of the injectate were placed at each foramen (level) into the transforaminal epidural space.   Additional Comments:  The patient tolerated the procedure well Dressing: 2 x 2 sterile gauze and Band-Aid    Post-procedure details: Patient was observed during the procedure. Post-procedure instructions were reviewed.  Patient left the clinic in stable condition.     Clinical History: MRI LUMBAR SPINE WITHOUT CONTRAST  TECHNIQUE: Multiplanar, multisequence MR imaging  of the lumbar spine was performed. No intravenous contrast was administered.  COMPARISON: CT abdomen and pelvis 11/08/2016  FINDINGS: Segmentation: Standard.  Alignment: Straightening of the normal lumbar lordosis. Mild lumbar dextroscoliosis. No significant listhesis in the sagittal plane.  Vertebrae: No fracture  or suspicious osseous lesion. Multiple small Schmorl's nodes. Multilevel degenerative endplate changes, greatest at L3-4 and with at most minimal degenerative edema.  Conus medullaris and cauda equina: Conus extends to the lower L1 level. Conus and cauda equina appear normal.  Paraspinal and other soft tissues: Unremarkable.  Disc levels:  Disc desiccation throughout the lumbar spine. Moderate disc space narrowing at L3-4 and L5-S1 with milder narrowing at L4-5.  T12-L1: Minimal disc bulging and facet arthrosis without stenosis.  L1-2: Negative.  L2-3: Negative.  L3-4: Circumferential disc bulging and moderate facet and ligamentum flavum hypertrophy result in moderate spinal stenosis, asymmetric left lateral recess stenosis, and moderate left neural foraminal stenosis.  L4-5: Circumferential disc bulging greater to the right and moderate facet hypertrophy result in mild bilateral lateral recess stenosis and moderate right and mild left neural foraminal stenosis without spinal stenosis.  L5-S1: Disc bulging greater to the right and moderate right and mild left facet arthrosis result in minimal right neural foraminal stenosis without spinal stenosis.  IMPRESSION: Multilevel lumbar disc and facet degeneration, most notable at L3-4 where there is moderate spinal and left neural foraminal stenosis.   Electronically Signed By: Logan Bores M.D. On: 06/21/2017 13:48 ------ MRI CERVICAL SPINE WITHOUT CONTRAST  TECHNIQUE: Multiplanar, multisequence MR imaging of the cervical spine was performed. No intravenous contrast was administered.  COMPARISON:  None.  FINDINGS: The study is moderately motion degraded. Note that the axial sequences accurately cross reference to only the sagittal STIR sequence.  Alignment: Reversal of the normal cervical lordosis. Trace retrolisthesis of C4 on C5, C5 on C6, and C6 on C7.  Vertebrae: No fracture, suspicious  osseous lesion, or significant marrow edema. Mild chronic degenerative endplate changes from Z6-X0.  Cord: Normal signal and morphology.  Posterior Fossa, vertebral arteries, paraspinal tissues: Unremarkable.  Disc levels:  C2-3: Mild left facet arthrosis and left uncovertebral spurring without significant stenosis.  C3-4: Disc bulging, uncovertebral spurring, and moderate right and mild left facet arthrosis result in moderate bilateral neural foraminal stenosis without spinal stenosis.  C4-5: Moderate disc space narrowing. Disc bulging, uncovertebral spurring, and mild facet arthrosis result in likely mild bilateral neural foraminal stenosis without spinal stenosis.  C5-6: Mild disc space narrowing. Disc bulging and uncovertebral spurring result in mild-to-moderate right and mild left neural foraminal stenosis without spinal stenosis.  C6-7: Mild disc space narrowing. Disc bulging and uncovertebral spurring result in mild-to-moderate right and mild left neural foraminal stenosis without spinal stenosis.  C7-T1: Mild facet arthrosis without disc herniation or stenosis.  IMPRESSION: 1. Moderate cervical disc degeneration without spinal stenosis. 2. Up to moderate multilevel neural foraminal stenosis as above, with assessment limited by motion artifact.   Electronically Signed   By: Logan Bores M.D.   On: 06/21/2017 13:41     Objective:  VS:  HT:    WT:   BMI:     BP:(!) 163/80  HR:95bpm  TEMP: ( )  RESP:  Physical Exam  Ortho Exam Imaging: No results found.

## 2018-12-06 NOTE — Procedures (Signed)
Lumbosacral Transforaminal Epidural Steroid Injection - Sub-Pedicular Approach with Fluoroscopic Guidance  Patient: [name]      Date of Birth: 05/17/1935 MRN: [id number] PCP: [name], MD      Visit Date: 10/22/2018   Universal Protocol:    Date/Time: 10/22/2018  Consent Given By: the patient  Position: PRONE  Additional Comments: Vital signs were monitored before and after the procedure. Patient was prepped and draped in the usual sterile fashion. The correct patient, procedure, and site was verified.   Injection Procedure Details:  Procedure Site One Meds Administered: No orders of the defined types were placed in this encounter.   Laterality: Bilateral  Location/Site:  L3-L4  Needle size: 22 G  Needle type: Spinal  Needle Placement: Transforaminal  Findings:    -Comments: Excellent flow of contrast into the epidural space.  Procedure Details: After squaring off the end-plates to get a true AP view, the C-arm was positioned so that an oblique view of the foramen as noted above was visualized. The target area is just inferior to the "nose of the scotty dog" or sub pedicular. The soft tissues overlying this structure were infiltrated with 2-3 ml. of 1% Lidocaine without Epinephrine.  The spinal needle was inserted toward the target using a "trajectory" view along the fluoroscope beam.  Under AP and lateral visualization, the needle was advanced so it did not puncture dura and was located close the 6 O'Clock position of the pedical in AP tracterory. Biplanar projections were used to confirm position. Aspiration was confirmed to be negative for CSF and/or blood. A 1-2 ml. volume of Isovue-250 was injected and flow of contrast was noted at each level. Radiographs were obtained for documentation purposes.   After attaining the desired flow of contrast documented above, a 0.5 to 1.0 ml test dose of 0.25% Marcaine was injected into each respective transforaminal  space.  The patient was observed for 90 seconds post injection.  After no sensory deficits were reported, and normal lower extremity motor function was noted,   the above injectate was administered so that equal amounts of the injectate were placed at each foramen (level) into the transforaminal epidural space.   Additional Comments:  The patient tolerated the procedure well Dressing: 2 x 2 sterile gauze and Band-Aid    Post-procedure details: Patient was observed during the procedure. Post-procedure instructions were reviewed.  Patient left the clinic in stable condition.

## 2018-12-09 ENCOUNTER — Telehealth: Payer: Self-pay | Admitting: Endocrinology

## 2018-12-09 ENCOUNTER — Ambulatory Visit (INDEPENDENT_AMBULATORY_CARE_PROVIDER_SITE_OTHER): Payer: Medicare Other | Admitting: Endocrinology

## 2018-12-09 ENCOUNTER — Other Ambulatory Visit: Payer: Self-pay

## 2018-12-09 ENCOUNTER — Encounter: Payer: Self-pay | Admitting: Endocrinology

## 2018-12-09 VITALS — BP 122/68 | HR 81 | Ht 63.0 in | Wt 182.0 lb

## 2018-12-09 DIAGNOSIS — E1165 Type 2 diabetes mellitus with hyperglycemia: Secondary | ICD-10-CM | POA: Diagnosis not present

## 2018-12-09 DIAGNOSIS — E1142 Type 2 diabetes mellitus with diabetic polyneuropathy: Secondary | ICD-10-CM

## 2018-12-09 DIAGNOSIS — E559 Vitamin D deficiency, unspecified: Secondary | ICD-10-CM

## 2018-12-09 DIAGNOSIS — E782 Mixed hyperlipidemia: Secondary | ICD-10-CM

## 2018-12-09 DIAGNOSIS — Z794 Long term (current) use of insulin: Secondary | ICD-10-CM | POA: Diagnosis not present

## 2018-12-09 LAB — COMPREHENSIVE METABOLIC PANEL
ALT: 16 U/L (ref 0–35)
AST: 22 U/L (ref 0–37)
Albumin: 4.1 g/dL (ref 3.5–5.2)
Alkaline Phosphatase: 46 U/L (ref 39–117)
BUN: 15 mg/dL (ref 6–23)
CO2: 34 mEq/L — ABNORMAL HIGH (ref 19–32)
Calcium: 9.7 mg/dL (ref 8.4–10.5)
Chloride: 96 mEq/L (ref 96–112)
Creatinine, Ser: 0.66 mg/dL (ref 0.40–1.20)
GFR: 85.85 mL/min (ref >60.00)
Glucose, Bld: 88 mg/dL (ref 70–99)
Potassium: 3.9 mEq/L (ref 3.5–5.1)
Sodium: 138 mEq/L (ref 135–145)
Total Bilirubin: 0.4 mg/dL (ref 0.2–1.2)
Total Protein: 7.6 g/dL (ref 6.0–8.3)

## 2018-12-09 LAB — POCT GLYCOSYLATED HEMOGLOBIN (HGB A1C): Hemoglobin A1C: 10.6 % — AB (ref 4.0–5.6)

## 2018-12-09 LAB — LIPID PANEL
Cholesterol: 145 mg/dL (ref 0–200)
HDL: 60 mg/dL (ref >39.00)
LDL Cholesterol: 60 mg/dL (ref 0–99)
NonHDL: 85.05
Total CHOL/HDL Ratio: 2
Triglycerides: 125 mg/dL (ref 0.0–149.0)
VLDL: 25 mg/dL (ref 0.0–40.0)

## 2018-12-09 LAB — VITAMIN D 25 HYDROXY (VIT D DEFICIENCY, FRACTURES): VITD: 38.42 ng/mL (ref 30.00–100.00)

## 2018-12-09 LAB — TSH: TSH: 2.03 u[IU]/mL (ref 0.35–4.50)

## 2018-12-09 MED ORDER — DEXCOM G6 RECEIVER DEVI: 1.0000 | 0 refills | Status: DC

## 2018-12-09 MED ORDER — DEXCOM G6 SENSOR MISC: 1.0000 | 3 refills | Status: DC

## 2018-12-09 MED ORDER — DEXCOM G6 TRANSMITTER MISC: 1.0000 | 3 refills | Status: DC

## 2018-12-09 NOTE — Progress Notes (Addendum)
Patient ID: [name], female   DOB: 10-10-1935, 83 y.o.   MRN: [id number]    Reason for Appointment: Diabetes and follow-up of multiple problems  History of Present Illness    PROBLEM 1:  DIABETES type II since 1980   She has been on insulin using Lantus and Humalog for several years and usually A1c around 8% despite fairly good compliance with diet, exercise and glucose monitoring. She also had been on Actos previously which probably benefited her especially with her fatty liver but she stopped it because of fear of side effects   She was changed from NovoLog to U-500 insulin starting in 02/2016; she did not related was controlling her sugars and preferred to go back to NovoLog.    Recent history:   Insulin regimen: Tresiba  60 units in a.m.  NOVOLOG before meals 12-18 units before meals  Her A1c has been previously consistently around 9- 10% and now 10.6   She has not brought her blood sugar monitor today  She also is not remembering what her blood sugars are  She says she has been on increased dose of prednisone for her back but has not increased her insulin or called to report her high blood sugars  Not clear if her fasting readings are high consistently since she thinks it has been as low as 126  She is adjusting her NovoLog based on her meal size but is not getting any extra for higher sugars  No lab glucose readings available also  Weight has stabilized   Oral hypoglycemic drugs: none       Side effects from medications: None         Proper timing of medications in relation to meals: Yes.          Monitors blood glucose:  About 3-4 times a day   Glucometer:  Accu-Chek         Blood Glucose readings not available   Previous readings:  PRE-MEAL Fasting Lunch Dinner Bedtime Overall  Glucose range:  87-195  160, 207  299  157, 325   Mean/median:     ?     Meals: 3 meals per day.  Breakfast 7 AM ; lunch 1-2 pm Supper 6 to 7 PM     Physical  activity: exercise: Unable to do any because of dyspnea and fatigue            Dietician visit: Most recent: Several years ago            Complications: are: Peripheral neuropathy with sensory loss     Wt Readings from Last 3 Encounters:  12/09/18 182 lb (82.6 kg)  12/02/18 180 lb (81.6 kg)  11/14/18 180 lb (81.6 kg)    Lab Results  Component Value Date   HGBA1C 10.6 (A) 12/09/2018   HGBA1C 8.8 (A) 05/09/2018   HGBA1C 8.2 (A) 02/06/2018   Lab Results  Component Value Date   MICROALBUR 1.1 05/11/2017   LDLCALC 75 11/06/2017   CREATININE 0.76 02/06/2018     OTHER Current problems  are detailed in review of systems   Allergies as of 12/09/2018      Reactions   Chlordiazepoxide-clidinium Other (See Comments)   Sleepy,weak   Biaxin [clarithromycin] Other (See Comments)   Foul taste, abd pain, diarrhea   Tramadol Other (See Comments)   Caused tremors   Codeine Other (See Comments)   REACTION: gi upset- only in high doses per pt.  Can tolerate cough  syrup with codeine.       Medication List       Accurate as of December 09, 2018 11:25 AM. If you have any questions, ask your nurse or doctor.        STOP taking these medications   acetaminophen 500 MG tablet Commonly known as: TYLENOL Stopped by: [name], MD   budesonide-formoterol 160-4.5 MCG/ACT inhaler Commonly known as: Symbicort Stopped by: [name], MD   chlorpheniramine 4 MG tablet Commonly known as: CHLOR-TRIMETON Stopped by: [name], MD   doxycycline 100 MG tablet Commonly known as: VIBRA-TABS Stopped by: [name], MD   irbesartan 75 MG tablet Commonly known as: Avapro Stopped by: [name], MD     TAKE these medications   Accu-Chek Softclix Lancet Dev Kit 1 each by Does not apply route daily. Use as instructed to check blood sugar 8 times daily.   Accu-Chek Softclix Lancets lancets 1 each by Other route daily. Use as instructed to check blood sugar 8 times daily.   AeroChamber MV  inhaler Use as instructed   ALPRAZolam 0.5 MG tablet Commonly known as: XANAX TAKE 1/2 TO 1 TABLET TWICE DAILY AS NEEDED FOR ANXIETY.   aspirin 325 MG tablet Take 325 mg by mouth daily.   cholestyramine 4 g packet Commonly known as: Questran 1 scoop/pack twice a day as needed.   dicyclomine 10 MG capsule Commonly known as: BENTYL TAKE (1) CAPSULE TWICE DAILY.   glucose blood test strip Commonly known as: Accu-Chek Aviva Plus USE AS INSTRUCTED TO CHECK BLOOD SUGARS 8 TIMES PER DAY. DX:E11.65   Insulin Pen Needle 31G X 8 MM Misc Commonly known as: B-D ULTRAFINE III SHORT PEN TEST 3 TIMES A DAY   B-D UF III MINI PEN NEEDLES 31G X 5 MM Misc Generic drug: Insulin Pen Needle USE 5 PER DAY TO INJECT INSULIN   Insulin Syringe-Needle U-100 31G X 15/64" 0.5 ML Misc Commonly known as: BD Insulin Syringe Ultrafine Use 2 per day dx code E11.65   ipratropium-albuterol 0.5-2.5 (3) MG/3ML Soln Commonly known as: DUONEB 1 vial in neb every 8 hours and as needed   magic mouthwash Soln Swish with 2 teaspoonfuls by mouth as needed What changed:   when to take this  reasons to take this   meclizine 25 MG tablet Commonly known as: ANTIVERT take 1 tablet by mouth three times a day if needed for dizziness/vertigo What changed:   when to take this  reasons to take this   methenamine 0.5 GM tablet Commonly known as: MANDELAMINE   metoprolol succinate 25 MG 24 hr tablet Commonly known as: TOPROL-XL TAKE 1 TABLET ONCE DAILY.   NovoLOG FlexPen 100 UNIT/ML FlexPen Generic drug: insulin aspart INJECT 16 UNITS EVERY MORNING WITH BREAKFAST, 22 UNITS AT LUNCH AND 28 UNITS AT SUPPER. What changed: See the new instructions.   omeprazole 20 MG capsule Commonly known as: PRILOSEC TAKE (1) CAPSULE DAILY.   OXYGEN Inhale 3 L into the lungs continuous.   pravastatin 20 MG tablet Commonly known as: PRAVACHOL Take 1 tablet (20 mg total) by mouth daily.   predniSONE 10 MG tablet  Commonly known as: DELTASONE Take 2 tablets (20 mg total) by mouth daily with breakfast. What changed: Another medication with the same name was removed. Continue taking this medication, and follow the directions you see here. Changed by: [name], MD   promethazine 25 MG tablet Commonly known as: PHENERGAN Take 1 tablet (25 mg total) by mouth every  6 (six) hours as needed for nausea or vomiting.   promethazine-codeine 6.25-10 MG/5ML syrup Commonly known as: PHENERGAN with CODEINE Take 5 mLs by mouth every 6 (six) hours as needed for cough.   sodium chloride HYPERTONIC 3 % nebulizer solution 1 ampule neb every 6 hours if needed to clear mucus   Stiolto Respimat 2.5-2.5 MCG/ACT Aers Generic drug: Tiotropium Bromide-Olodaterol USE 2 PUFFS DAILY   Tresiba FlexTouch 200 UNIT/ML Sopn Generic drug: Insulin Degludec INJECT 60 UNITS SUBCUTANEOUSLY ONCE DAILY.   trimethoprim 100 MG tablet Commonly known as: TRIMPEX   Vitamin D (Ergocalciferol) 1.25 MG (50000 UT) Caps capsule Commonly known as: DRISDOL Take 1 capsule (50,000 Units total) by mouth once a week.   Xopenex HFA 45 MCG/ACT inhaler Generic drug: levalbuterol INHALE 2 PUFFS EVERY 6 HOURS IF NEEDED FOR WHEEZING OR SHORTNESS OF BREATH.       Allergies:  Allergies  Allergen Reactions  . Chlordiazepoxide-Clidinium Other (See Comments)    Sleepy,weak  . Biaxin [Clarithromycin] Other (See Comments)    Foul taste, abd pain, diarrhea  . Tramadol Other (See Comments)    Caused tremors  . Codeine Other (See Comments)    REACTION: gi upset- only in high doses per pt.  Can tolerate cough syrup with codeine.     Past Medical History:  Diagnosis Date  . Allergic rhinitis, cause unspecified    Sinus CT Rec 12-23-2009  . Arthritis   . Benign paroxysmal positional vertigo 08/09/2012  . 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 Complication of anesthesia    takes along time  to wake up  . Diabetes mellitus   . Diarrhea   . Diverticulosis   . Esophageal reflux   . Esophageal stricture   . GERD (gastroesophageal reflux disease)   . Glaucoma   . Hiatal hernia   . Hypertension   . Irritable bowel syndrome   . Neuropathy in diabetes ([place]) 08/09/2012  . Other chronic nonalcoholic liver disease   . Oxygen deficiency   . Sciatica   . Scoliosis   . Shingles 2010  . Shortness of breath   . Steatohepatitis     Past Surgical History:  Procedure Laterality Date  . APPENDECTOMY    . CARPAL TUNNEL RELEASE     right hand  . CHOLECYSTECTOMY OPEN  1978  . COLONOSCOPY  07-2001   mild diverticulosis  . ESOPHAGOGASTRODUODENOSCOPY  [id number]   H Hernia,es.stricture s/p dil 75F  . INTRAOCULAR LENS INSERTION Bilateral   . LIVER BIOPSY  [phone]  . PARATHYROID EXPLORATION    . TONSILLECTOMY AND ADENOIDECTOMY    . TOTAL ABDOMINAL HYSTERECTOMY    . ULNAR NERVE TRANSPOSITION  12/07/2011   Procedure: ULNAR NERVE DECOMPRESSION/TRANSPOSITION;this was cancelled-not done  Surgeon: Cammie Sickle., MD;  Location: [facility];  Service: Orthopedics;  Laterality: Right;  right ulnar nerve in situ decompression  . ULNAR TUNNEL RELEASE  03/07/2012   Procedure: CUBITAL TUNNEL RELEASE;  Surgeon: Roseanne Kaufman, MD;  Location: [facility];  Service: Orthopedics;  Laterality: Right;  ulnar nerve release at the elbow      Family History  Problem Relation Age of Onset  . Heart disease Father   . Lung cancer Mother        small cell;Byssinosis  . Lung cancer Sister   . Liver cancer Sister        ? mets from another area of the body  .  Diabetes Unknown        grandmother  . Stroke Maternal Grandfather     Social History:  reports that she quit smoking about 30 years ago. Her smoking use included cigarettes. She has never used smokeless tobacco. She reports that she does not drink alcohol or use drugs.  Review of Systems:  DIARRHEA: She has had  chronic diarrhea treated with Questran or Colestid, reportedly from irritable bowel Has been to several gastroenterologists Only occasional problem now  HYPERTENSION:   blood pressure is controlled with adding 75 mg of irbesartan Has not checked blood pressure regularly at home   BP Readings from Last 3 Encounters:  12/09/18 122/68  10/22/18 (!) 163/80  10/08/18 (!) 156/81    She takes Xanax at night as needed for sleep and lorazepam as needed for anxiety  HYPERLIPIDEMIA:  She has had mixed hyperlipidemia, LDL treated with pravastatin 20 mg Last LDL adequately controlled  Needs follow-up  Lab Results  Component Value Date   CHOL 148 11/06/2017   HDL 52.30 11/06/2017   LDLCALC 75 11/06/2017   LDLDIRECT 89.0 10/14/2014   TRIG 104.0 11/06/2017   CHOLHDL 3 11/06/2017    She is followed by pulmonologist for chronic COPD, using home oxygen Also has recurrent cough  She has numerous musculoskeletal issues  She has had persistent joint pains and back pain Currently on 10 mg twice daily of prednisone    Lab Results  Component Value Date   VD25OH 19.95 (L) 02/06/2018      Examination:   BP 122/68 (BP Location: Left Arm, Patient Position: Sitting, Cuff Size: Normal)   Pulse 81   Ht 5' 3"  (1.6 m)   Wt 182 lb (82.6 kg)   SpO2 98%   BMI 32.24 kg/m   Body mass index is 32.24 kg/m.     ASSESSMENT/ PLAN:    Diabetes type 2 with obesity, insulin-dependent  See history of present illness for detailed discussion of  current management, blood sugar patterns and problems identified  Her A1c is higher than usual at 10.6  Today without her blood sugar meter and no recall of her blood sugars difficult to adjust her insulin She is reporting readings over 200 but not clear how consistently and at what time Blood sugars are higher with prednisone  She was told to call back when she gets home today to review her blood sugars by phone and adjust her insulin accordingly We  will also review her meter when she comes back in 4 weeks   HYPERTENSION: Treated with 75 mg of Avapro Blood pressure well controlled   History of vitamin D deficiency, she will have follow-up levels today   LIPIDS: To have labs checked today   There are no Patient Instructions on file for this visit.    [name] 12/09/2018, 11:25 AM     Addendum: Blood sugar readings given by patient show fasting readings of 121-182 Blood sugars blood sugars between 3-4 PM 271, 368  She needs to increase her NovoLog by 10 units at each meal until she gets off prednisone, continue same [place]

## 2018-12-09 NOTE — Telephone Encounter (Signed)
Unable to reach patient by phone which was busy Recommend that she increase her NovoLog by 10 units over and above what she is doing now as long as she is on prednisone.  Also need to know what her blood sugars are at bedtime

## 2018-12-09 NOTE — Patient Instructions (Signed)
Call re: [name]

## 2018-12-10 ENCOUNTER — Other Ambulatory Visit: Payer: Medicare Other

## 2018-12-10 NOTE — Telephone Encounter (Signed)
Called pt and she stated that she needed to leave home for an appt, but she would call back later to provide these readings.

## 2018-12-10 NOTE — Telephone Encounter (Signed)
Pt returned call and stated that she would bring her meter to the office tomorrow to have it downloaded. Pt was also given MD message regarding insulin as it relates to prednisone. Pt verbalized understanding.

## 2018-12-12 ENCOUNTER — Other Ambulatory Visit: Payer: Self-pay | Admitting: Endocrinology

## 2018-12-12 NOTE — Progress Notes (Signed)
Please call to let patient know that the lab results are normal and no further action needed

## 2018-12-13 ENCOUNTER — Ambulatory Visit: Payer: Medicare Other | Admitting: Endocrinology

## 2018-12-18 DIAGNOSIS — R351 Nocturia: Secondary | ICD-10-CM | POA: Diagnosis not present

## 2018-12-18 DIAGNOSIS — N3941 Urge incontinence: Secondary | ICD-10-CM | POA: Diagnosis not present

## 2018-12-18 DIAGNOSIS — N3020 Other chronic cystitis without hematuria: Secondary | ICD-10-CM | POA: Diagnosis not present

## 2018-12-24 NOTE — Telephone Encounter (Signed)
Patient has called in regards to her Vitamin D and the changes of her insulin. She states that no one gave her those results and no one informed her the changes of her insulin.  Informed patient of information below and she stated that I need to tell them to get on the ball.   Placed patient on hold to see if Olen Cordial wanted to speak with her, she then disconnected.  Please Advise, Thanks

## 2018-12-24 NOTE — Telephone Encounter (Signed)
Pt was contacted on 12/12/2018 and given MD message. Pt was then contacted again today to reiterate this information to the patient.

## 2018-12-24 NOTE — Telephone Encounter (Signed)
Pt verbalized understanding and she was informed to call office back if she should have any questions.

## 2018-12-30 ENCOUNTER — Telehealth: Payer: Self-pay | Admitting: Gastroenterology

## 2018-12-30 NOTE — Telephone Encounter (Signed)
Pt reported that she has a knot on the side of her stomach like "a hard mass."  Please advise.

## 2018-12-30 NOTE — Telephone Encounter (Signed)
Patient reports that she has a small bulge on her side.  It is hard at times and has been there a few weeks.  She would like to come in and see Amy Esterwood PA.  She will come on 01/14/19 11:30

## 2019-01-02 ENCOUNTER — Ambulatory Visit: Payer: Medicare Other | Admitting: Orthopedic Surgery

## 2019-01-09 ENCOUNTER — Telehealth: Payer: Self-pay | Admitting: Internal Medicine

## 2019-01-09 NOTE — Telephone Encounter (Signed)
Returned call to patient who says [employer] called and told her she needed to schedule a 6 minute walk but she is not sure why.  Advised patient that we will contact [employer] to see what is needed and have Dr. Annamaria Boots review as needed.  We will contact her back once we have more information.  Patient acknowledged understanding.  Will hold encounter open for follow up tomorrow.

## 2019-01-10 NOTE — Telephone Encounter (Signed)
Sent community message to [place] with [employer] to see exactly what is needed.

## 2019-01-14 ENCOUNTER — Other Ambulatory Visit: Payer: Self-pay

## 2019-01-14 ENCOUNTER — Ambulatory Visit: Payer: Medicare Other | Admitting: Physician Assistant

## 2019-01-14 ENCOUNTER — Ambulatory Visit (INDEPENDENT_AMBULATORY_CARE_PROVIDER_SITE_OTHER): Payer: Medicare Other | Admitting: Endocrinology

## 2019-01-14 ENCOUNTER — Telehealth: Payer: Self-pay | Admitting: Endocrinology

## 2019-01-14 ENCOUNTER — Encounter: Payer: Self-pay | Admitting: Endocrinology

## 2019-01-14 VITALS — BP 138/68 | HR 96 | Ht 63.0 in | Wt 186.4 lb

## 2019-01-14 DIAGNOSIS — I10 Essential (primary) hypertension: Secondary | ICD-10-CM | POA: Diagnosis not present

## 2019-01-14 DIAGNOSIS — H10403 Unspecified chronic conjunctivitis, bilateral: Secondary | ICD-10-CM | POA: Diagnosis not present

## 2019-01-14 DIAGNOSIS — Z794 Long term (current) use of insulin: Secondary | ICD-10-CM | POA: Diagnosis not present

## 2019-01-14 DIAGNOSIS — E1142 Type 2 diabetes mellitus with diabetic polyneuropathy: Secondary | ICD-10-CM | POA: Diagnosis not present

## 2019-01-14 DIAGNOSIS — E559 Vitamin D deficiency, unspecified: Secondary | ICD-10-CM | POA: Diagnosis not present

## 2019-01-14 DIAGNOSIS — E1165 Type 2 diabetes mellitus with hyperglycemia: Secondary | ICD-10-CM | POA: Diagnosis not present

## 2019-01-14 MED ORDER — FARXIGA 5 MG PO TABS: 5.0000 mg | ORAL_TABLET | Freq: Every day | ORAL | 3 refills | Status: DC

## 2019-01-14 NOTE — Telephone Encounter (Signed)
Stenson, Elisha Ponder, CMA; Sheran Lawless, Melissa; [place], Hoy Register morning Ria Comment!  I'm including Levada Dy so she can check the status of this patient's O2.   If she is needing to be requalified, she will just need a normal walk test and documentation. A 6 MWT is not required for requal. I have asked our requal team to refrain from using this term when asking for requal documentation.   Thanks and have a great day!  Melissa

## 2019-01-14 NOTE — Telephone Encounter (Signed)
She was told to stop this when starting [place]

## 2019-01-14 NOTE — Telephone Encounter (Signed)
Left message for patient to call back. I reviewed Dr. Janee Morn schedule and he does not have any appts available for the next 2 weeks. Patient will have to get scheduled with one of the NPs if she wants to keep her O2.

## 2019-01-14 NOTE — Telephone Encounter (Signed)
Called pharmacy and they stated that the pt is having Irbesartan 26m PO QD filled regularly.  Medication has been added to he medication list. Is this the medication she is supposed to cut back on or discontinue after starting FIran

## 2019-01-14 NOTE — Patient Instructions (Addendum)
Take Farxiga 3x weekly for 2 weeks then daily  Increase fluids  Reduce Tresiba to 56 with this  Check blood sugars on waking up days a week  Also check blood sugars about 2 hours after meals and do this after different meals by rotation  Recommended blood sugar levels on waking up are 90-130 and about 2 hours after meal is 130-180  Please bring your blood sugar monitor to each visit, thank you  Take 2-4 units more Novolog

## 2019-01-14 NOTE — Telephone Encounter (Signed)
She had been on irbesartan 75 mg for hypertension but did not see it now.  Please check with her pharmacy about what she is taking for blood pressure

## 2019-01-14 NOTE — Progress Notes (Signed)
Patient ID: [name], female   DOB: [birth year], 83 y.o.   MRN: [id number]    Reason for Appointment: Diabetes and follow-up of multiple problems  History of Present Illness    PROBLEM 1:  DIABETES type II since 1980   She has been on insulin using Lantus and Humalog for several years and usually A1c around 8% despite fairly good compliance with diet, exercise and glucose monitoring. She also had been on Actos previously which probably benefited her especially with her fatty liver but she stopped it because of fear of side effects   She was changed from NovoLog to U-500 insulin starting in 02/2016; she did not related was controlling her sugars and preferred to go back to NovoLog.    Recent history:   Insulin regimen: Tresiba  60 units in a.m.  NOVOLOG before meals 12-14 units before breakfast and upto 22 at dinnertime  Her A1c has been previously consistently around 9- 10% and last 10.6   She did not have her monitor on the last visit  However review of her recent blood sugars indicate marked fluctuation in her blood sugars  She is not checking blood sugars consistently every day because of difficulty with finger breaths and discomfort  She still has not pursued her Dexcom has not brought her blood sugar monitor today  HIGHEST blood sugars are in the evenings around dinnertime and some of the readings may be before meals also  FASTING blood sugars fluctuate significantly higher  Without any prednisone however her blood sugars may be somewhat better recently  Some of her difficulty is not remembering to take her NovoLog such as last night  Also occasionally if the blood sugars are normal she may not take her NovoLog at breakfast  Weight has increased slightly  This is despite her saying that she does not get very hungry   Oral hypoglycemic drugs: none       Side effects from medications: None         Proper timing of medications in relation to meals:  Yes.          Monitors blood glucose:  Recently 4 times a day   Glucometer:  Accu-Chek          Blood Glucose readings   PRE-MEAL  morning Lunch  7-9 PM  overnight Overall  Glucose range:  88-271   171-389  167-380  71-454  Mean/median:  185  263  277   221    Previous readings:  PRE-MEAL Fasting Lunch Dinner Bedtime Overall  Glucose range:  87-195  160, 207  299  157, 325   Mean/median:     ?     Meals: 3 meals per day.  Breakfast 7 AM ; lunch 1-2 pm Supper 6 to 7 PM     Physical activity: exercise: Unable to do any because of dyspnea and fatigue            Dietician visit: Most recent: Several years ago            Complications: are: Peripheral neuropathy with sensory loss     Wt Readings from Last 3 Encounters:  01/14/19 186 lb 6.4 oz (84.6 kg)  12/09/18 182 lb (82.6 kg)  12/02/18 180 lb (81.6 kg)    Lab Results  Component Value Date   HGBA1C 10.6 (A) 12/09/2018   HGBA1C 8.8 (A) 05/09/2018   HGBA1C 8.2 (A) 02/06/2018   Lab Results  Component Value Date  MICROALBUR 1.1 05/11/2017   LDLCALC 60 12/09/2018   CREATININE 0.66 12/09/2018     OTHER Current problems  are detailed in review of systems   Allergies as of 01/14/2019      Reactions   Chlordiazepoxide-clidinium Other (See Comments)   Sleepy,weak   Biaxin [clarithromycin] Other (See Comments)   Foul taste, abd pain, diarrhea   Tramadol Other (See Comments)   Caused tremors   Codeine Other (See Comments)   REACTION: gi upset- only in high doses per pt.  Can tolerate cough syrup with codeine.       Medication List       Accurate as of January 14, 2019 10:22 AM. If you have any questions, ask your nurse or doctor.        Accu-Chek Softclix Lancet Dev Kit 1 each by Does not apply route daily. Use as instructed to check blood sugar 8 times daily.   Accu-Chek Softclix Lancets lancets 1 each by Other route daily. Use as instructed to check blood sugar 8 times daily.   AeroChamber MV inhaler Use  as instructed   ALPRAZolam 0.5 MG tablet Commonly known as: XANAX TAKE 1/2 TO 1 TABLET TWICE DAILY AS NEEDED FOR ANXIETY.   aspirin 325 MG tablet Take 325 mg by mouth daily.   cholestyramine 4 g packet Commonly known as: Questran 1 scoop/pack twice a day as needed.   Dexcom G6 Receiver Devi 1 each by Does not apply route See admin instructions. Use Dexcom G6 receiver to monitor blood glucose levels.   Dexcom G6 Sensor Misc 1 each by Does not apply route See admin instructions. Apply 1 sensor to body once every 10 days.   Dexcom G6 Transmitter Misc 1 each by Does not apply route See admin instructions. Use 1 transmitter once every 90 days.   dicyclomine 10 MG capsule Commonly known as: BENTYL TAKE (1) CAPSULE TWICE DAILY.   glucose blood test strip Commonly known as: Accu-Chek Aviva Plus USE AS INSTRUCTED TO CHECK BLOOD SUGARS 8 TIMES PER DAY. DX:E11.65   Insulin Pen Needle 31G X 8 MM Misc Commonly known as: B-D ULTRAFINE III SHORT PEN TEST 3 TIMES A DAY   B-D UF III MINI PEN NEEDLES 31G X 5 MM Misc Generic drug: Insulin Pen Needle USE 5 PER DAY TO INJECT INSULIN   Insulin Syringe-Needle U-100 31G X 15/64" 0.5 ML Misc Commonly known as: BD Insulin Syringe Ultrafine Use 2 per day dx code E11.65   ipratropium-albuterol 0.5-2.5 (3) MG/3ML Soln Commonly known as: DUONEB 1 vial in neb every 8 hours and as needed   magic mouthwash Soln Swish with 2 teaspoonfuls by mouth as needed What changed:   when to take this  reasons to take this   meclizine 25 MG tablet Commonly known as: ANTIVERT take 1 tablet by mouth three times a day if needed for dizziness/vertigo What changed:   when to take this  reasons to take this   methenamine 0.5 GM tablet Commonly known as: MANDELAMINE   metoprolol succinate 25 MG 24 hr tablet Commonly known as: TOPROL-XL TAKE 1 TABLET ONCE DAILY.   NovoLOG FlexPen 100 UNIT/ML FlexPen Generic drug: insulin aspart INJECT 16 UNITS  EVERY MORNING WITH BREAKFAST, 22 UNITS AT LUNCH AND 28 UNITS AT SUPPER. What changed: See the new instructions.   omeprazole 20 MG capsule Commonly known as: PRILOSEC TAKE (1) CAPSULE DAILY.   OXYGEN Inhale 3 L into the lungs continuous.   pravastatin 20 MG tablet Commonly  known as: PRAVACHOL TAKE 1 TABLET ONCE DAILY.   predniSONE 10 MG tablet Commonly known as: DELTASONE Take 2 tablets (20 mg total) by mouth daily with breakfast.   promethazine 25 MG tablet Commonly known as: PHENERGAN Take 1 tablet (25 mg total) by mouth every 6 (six) hours as needed for nausea or vomiting.   promethazine-codeine 6.25-10 MG/5ML syrup Commonly known as: PHENERGAN with CODEINE Take 5 mLs by mouth every 6 (six) hours as needed for cough.   sodium chloride HYPERTONIC 3 % nebulizer solution 1 ampule neb every 6 hours if needed to clear mucus   Stiolto Respimat 2.5-2.5 MCG/ACT Aers Generic drug: Tiotropium Bromide-Olodaterol USE 2 PUFFS DAILY   Tresiba FlexTouch 200 UNIT/ML Sopn Generic drug: Insulin Degludec INJECT 60 UNITS SUBCUTANEOUSLY ONCE DAILY.   trimethoprim 100 MG tablet Commonly known as: TRIMPEX   Vitamin D (Ergocalciferol) 1.25 MG (50000 UT) Caps capsule Commonly known as: DRISDOL Take 1 capsule (50,000 Units total) by mouth once a week.   Xopenex HFA 45 MCG/ACT inhaler Generic drug: levalbuterol INHALE 2 PUFFS EVERY 6 HOURS IF NEEDED FOR WHEEZING OR SHORTNESS OF BREATH.       Allergies:  Allergies  Allergen Reactions  . Chlordiazepoxide-Clidinium Other (See Comments)    Sleepy,weak  . Biaxin [Clarithromycin] Other (See Comments)    Foul taste, abd pain, diarrhea  . Tramadol Other (See Comments)    Caused tremors  . Codeine Other (See Comments)    REACTION: gi upset- only in high doses per pt.  Can tolerate cough syrup with codeine.     Past Medical History:  Diagnosis Date  . Allergic rhinitis, cause unspecified    Sinus CT Rec 12-23-2009  . Arthritis   .  Benign paroxysmal positional vertigo 08/09/2012  . 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 Complication of anesthesia    takes along time to wake up  . Diabetes mellitus   . Diarrhea   . Diverticulosis   . Esophageal reflux   . Esophageal stricture   . GERD (gastroesophageal reflux disease)   . Glaucoma   . Hiatal hernia   . Hypertension   . Irritable bowel syndrome   . Neuropathy in diabetes ([place]) 08/09/2012  . Other chronic nonalcoholic liver disease   . Oxygen deficiency   . Sciatica   . Scoliosis   . Shingles 2010  . Shortness of breath   . Steatohepatitis     Past Surgical History:  Procedure Laterality Date  . APPENDECTOMY    . CARPAL TUNNEL RELEASE     right hand  . CHOLECYSTECTOMY OPEN  1978  . COLONOSCOPY  07-2001   mild diverticulosis  . ESOPHAGOGASTRODUODENOSCOPY  [id number]   H Hernia,es.stricture s/p dil 74F  . INTRAOCULAR LENS INSERTION Bilateral   . LIVER BIOPSY  [id number]  . PARATHYROID EXPLORATION    . TONSILLECTOMY AND ADENOIDECTOMY    . TOTAL ABDOMINAL HYSTERECTOMY    . ULNAR NERVE TRANSPOSITION  12/07/2011   Procedure: ULNAR NERVE DECOMPRESSION/TRANSPOSITION;this was cancelled-not done  Surgeon: Cammie Sickle., MD;  Location: [facility];  Service: Orthopedics;  Laterality: Right;  right ulnar nerve in situ decompression  . ULNAR TUNNEL RELEASE  03/07/2012   Procedure: CUBITAL TUNNEL RELEASE;  Surgeon: Roseanne Kaufman, MD;  Location: [facility];  Service: Orthopedics;  Laterality: Right;  ulnar nerve release at the elbow      Family History  Problem Relation Age of  Onset  . Heart disease Father   . Lung cancer Mother        small cell;Byssinosis  . Lung cancer Sister   . Liver cancer Sister        ? mets from another area of the body  . Diabetes Unknown        grandmother  . Stroke Maternal Grandfather     Social History:  reports that she quit smoking about  30 years ago. Her smoking use included cigarettes. She has never used smokeless tobacco. She reports that she does not drink alcohol or use drugs.  Review of Systems:  DIARRHEA: She has had chronic diarrhea treated with Questran or Colestid, reportedly from irritable bowel Has been to several gastroenterologists Diarrhea has been only occasional   HYPERTENSION:   blood pressure is treated with 75 mg of irbesartan However not clear why this is not on her list Has not checked blood pressure regularly at home  BP Readings from Last 3 Encounters:  01/14/19 138/68  12/09/18 122/68  10/22/18 (!) 163/80    She takes Xanax at night as needed for sleep and lorazepam as needed for anxiety  HYPERLIPIDEMIA:  She has had mixed hyperlipidemia, LDL treated with pravastatin 20 mg Last LDL adequately controlled  Needs follow-up  Lab Results  Component Value Date   CHOL 145 12/09/2018   HDL 60.00 12/09/2018   LDLCALC 60 12/09/2018   LDLDIRECT 89.0 10/14/2014   TRIG 125.0 12/09/2018   CHOLHDL 2 12/09/2018    She is followed by pulmonologist for chronic COPD, using home oxygen Also has recurrent cough  She has numerous musculoskeletal issues  She has had persistent joint pains and back pain   He has started taking her vitamin D consistently and her levels are improving   Lab Results  Component Value Date   VD25OH 38.42 12/09/2018   VD25OH 19.95 (L) 02/06/2018      Examination:   BP 138/68 (BP Location: Left Arm, Patient Position: Sitting, Cuff Size: Normal)   Pulse 96   Ht _0  (1.6 m)   Wt 186 lb 6.4 oz (84.6 kg)   SpO2 96%   BMI 33.02 kg/m   Body mass index is 33.02 kg/m.     ASSESSMENT/ PLAN:    Diabetes type 2 with obesity, insulin-dependent  See history of present illness for detailed discussion of  current management, blood sugar patterns and problems identified  Her A1c was previously 10.6  Although she is checking her blood sugars at different times  but not as consistently in the evenings not clear if she needs any adjustment of her doses However discussed that she needs at least 2 to 4 units of NovoLog more for her meals regardless of whether she is eating much or not especially at lunch and dinner where she may need more Because of her problems with poor control, insulin resistance, difficulty with weight loss and cardiovascular risk reduction she will be started on FARXIGA 5 mg daily Discussed that we will need to adjust her blood pressure medication possibly Although she is having a history of recurrent UTIs she is now on prophylactic treatment without significant recurrences  Discussed action of SGLT 2 drugs on lowering glucose by decreasing kidney absorption of glucose, benefits of weight loss and lower blood pressure, possible side effects including candidiasis and dosage regimen  She will try Farxiga 3 times a week for the first 2 weeks and if tolerated go up to 1 tablet daily She  will need to call to see what blood pressure medications he is taking Also needs to increase fluids   HYPERTENSION: Has been treated with 75 mg of Avapro Blood pressure well controlled  History of vitamin D deficiency, she will continue her prescription dose weekly   LIPIDS: Adequately controlled   Patient Instructions  Take Farxiga 3x weekly   Counseling time on subjects discussed in assessment and plan sections is over 50% of today's 25 minute visit   [name] 01/14/2019, 10:22 AM

## 2019-01-14 NOTE — Telephone Encounter (Signed)
[place], Stanford Breed, Lenna Sciara; Randa Spike, CMA; Julian Hy, Samuel Germany        Yes she is needing to be requalified for her o2.  We will need the walk test and office visit documentation.

## 2019-01-15 ENCOUNTER — Other Ambulatory Visit: Payer: Self-pay | Admitting: Endocrinology

## 2019-01-15 NOTE — Telephone Encounter (Signed)
Called and spoke with pt regarding Cherina's message below. Pt stated she was booked up with doctor's appts and the earliest she could get in would be the week of 01/27/2019. She agreed to setting up an appt on 01/27/2019 at 10:00 AM EDT with TP to get a walk test completed in order for her to meet her O2 insurance requirements. Appt has been scheduled. Nothing further needed at this time.

## 2019-01-15 NOTE — Telephone Encounter (Signed)
Pt called and given Md message. Pt verbalized understanding.

## 2019-01-15 NOTE — Telephone Encounter (Signed)
Please refill if appropriate

## 2019-01-17 ENCOUNTER — Ambulatory Visit (INDEPENDENT_AMBULATORY_CARE_PROVIDER_SITE_OTHER): Payer: Medicare Other | Admitting: Physician Assistant

## 2019-01-17 ENCOUNTER — Encounter: Payer: Self-pay | Admitting: Physician Assistant

## 2019-01-17 ENCOUNTER — Other Ambulatory Visit (INDEPENDENT_AMBULATORY_CARE_PROVIDER_SITE_OTHER): Payer: Medicare Other

## 2019-01-17 ENCOUNTER — Telehealth: Payer: Self-pay | Admitting: Internal Medicine

## 2019-01-17 VITALS — BP 130/60 | HR 88 | Temp 98.4°F | Ht 63.0 in | Wt 186.0 lb

## 2019-01-17 DIAGNOSIS — R198 Other specified symptoms and signs involving the digestive system and abdomen: Secondary | ICD-10-CM | POA: Diagnosis not present

## 2019-01-17 LAB — BASIC METABOLIC PANEL
BUN: 8 mg/dL (ref 6–23)
CO2: 35 mEq/L — ABNORMAL HIGH (ref 19–32)
Calcium: 9.8 mg/dL (ref 8.4–10.5)
Chloride: 99 mEq/L (ref 96–112)
Creatinine, Ser: 0.68 mg/dL (ref 0.40–1.20)
GFR: 82.92 mL/min (ref >60.00)
Glucose, Bld: 66 mg/dL — ABNORMAL LOW (ref 70–99)
Potassium: 4.3 mEq/L (ref 3.5–5.1)
Sodium: 140 mEq/L (ref 135–145)

## 2019-01-17 NOTE — Telephone Encounter (Signed)
Call made to patient, confirmed that she has received the POC.   Call returned to [name] with [facility], made aware the patient has received her POC. Nothing further needed at this time.

## 2019-01-17 NOTE — Progress Notes (Signed)
Subjective:    Patient ID: [name], female    DOB: Jun 14, 1935, 83 y.o.   MRN: [id number]  HPI [name] is a pleasant 83 year old white female, established with Dr. Fuller Plan with history of IBS, reticulosis, GERD and constipation.  She also has O2 dependent COPD, insulin-dependent diabetes mellitus, scoliosis.  She is status post cholecystectomy. She comes in today with concerns about a bulge that she has noticed on her left side.  She was examined by her home health care worker who also was concerned and suggested she follow-up with GI.  She says she noticed this area about a month ago which has not been particularly tender.  She says it is most obvious when she standing up and says it feels like about a tennis ball sized protrusion.  She became a little bit concerned because this was firm. She is not had any recent changes in her bowel habits.  She says she has bowel movement every couple of days and sometimes stools are soft and sometimes diarrheal.  She has some episodes with ongoing diarrhea will use Imodium as needed.  She is also switched to a lactose-free milk and thinks that has helped some as well. She had flexible sigmoidoscopy in 2018 by Dr. Alessandra Bevels with finding of moderate stool in the rectum and sigmoid.  She had been treated for SEBO in the past without any benefit.  Last full colonoscopy 2012 Dr. [name] negative CT of the abdomen and pelvis May 2018 with finding of a large duodenal diverticulum.   Review of Systems Pertinent positive and negative review of systems were noted in the above HPI section.  All other review of systems was otherwise negative.  Outpatient Encounter Medications as of 01/17/2019  Medication Sig  . ACCU-CHEK SOFTCLIX LANCETS lancets 1 each by Other route daily. Use as instructed to check blood sugar 8 times daily.  Marland Kitchen ALPRAZolam (XANAX) 0.5 MG tablet TAKE 1/2 TO 1 TABLET TWICE DAILY AS NEEDED FOR ANXIETY.  Marland Kitchen aspirin 325 MG tablet Take 325 mg by mouth  daily.    . B-D UF III MINI PEN NEEDLES 31G X 5 MM MISC USE 5 PER DAY TO INJECT INSULIN  . cholestyramine (QUESTRAN) 4 g packet 1 scoop/pack twice a day as needed.  . Continuous Blood Gluc Receiver ([place]) [place] 1 each by Does not apply route See admin instructions. Use Dexcom G6 receiver to monitor blood glucose levels.  . Continuous Blood Gluc Sensor (DEXCOM G6 SENSOR) MISC 1 each by Does not apply route See admin instructions. Apply 1 sensor to body once every 10 days.  . Continuous Blood Gluc Transmit (DEXCOM G6 TRANSMITTER) MISC 1 each by Does not apply route See admin instructions. Use 1 transmitter once every 90 days.  . dapagliflozin propanediol (FARXIGA) 5 MG TABS tablet Take 5 mg by mouth daily.  Marland Kitchen dicyclomine (BENTYL) 10 MG capsule TAKE (1) CAPSULE TWICE DAILY.  Marland Kitchen glucose blood (ACCU-CHEK AVIVA PLUS) test strip USE AS INSTRUCTED TO CHECK BLOOD SUGARS 8 TIMES PER DAY. DX:E11.65  . insulin aspart (NOVOLOG FLEXPEN) 100 UNIT/ML FlexPen Inject 12-16 units under the skin three times daily.  . Insulin Pen Needle (B-D ULTRAFINE III SHORT PEN) 31G X 8 MM MISC TEST 3 TIMES A DAY  . Insulin Syringe-Needle U-100 (BD INSULIN SYRINGE ULTRAFINE) 31G X 15/64" 0.5 ML MISC Use 2 per day dx code E11.65  . ipratropium-albuterol (DUONEB) 0.5-2.5 (3) MG/3ML SOLN 1 vial in neb every 8 hours and as needed  .  Lancets Misc. (ACCU-CHEK SOFTCLIX LANCET DEV) KIT 1 each by Does not apply route daily. Use as instructed to check blood sugar 8 times daily.  . magic mouthwash SOLN Swish with 2 teaspoonfuls by mouth as needed (Patient taking differently: daily as needed. Swish with 2 teaspoonfuls by mouth as needed)  . meclizine (ANTIVERT) 25 MG tablet take 1 tablet by mouth three times a day if needed for dizziness/vertigo (Patient taking differently: daily as needed. take 1 tablet by mouth three times a day if needed for dizziness/vertigo)  . methenamine (MANDELAMINE) 0.5 GM tablet   . metoprolol succinate  (TOPROL-XL) 25 MG 24 hr tablet TAKE 1 TABLET ONCE DAILY.  Marland Kitchen omeprazole (PRILOSEC) 20 MG capsule TAKE (1) CAPSULE DAILY.  Marland Kitchen OXYGEN Inhale 3 L into the lungs continuous.   . pravastatin (PRAVACHOL) 20 MG tablet TAKE 1 TABLET ONCE DAILY.  . sodium chloride HYPERTONIC 3 % nebulizer solution 1 ampule neb every 6 hours if needed to clear mucus  . Spacer/Aero-Holding Chambers (AEROCHAMBER MV) inhaler Use as instructed  . STIOLTO RESPIMAT 2.5-2.5 MCG/ACT AERS USE 2 PUFFS DAILY  . TRESIBA FLEXTOUCH 200 UNIT/ML SOPN INJECT 60 UNITS SUBCUTANEOUSLY ONCE DAILY.  Marland Kitchen Vitamin D, Ergocalciferol, (DRISDOL) 1.25 MG (50000 UT) CAPS capsule Take 1 capsule (50,000 Units total) by mouth once a week.  [place] HFA 45 MCG/ACT inhaler INHALE 2 PUFFS EVERY 6 HOURS IF NEEDED FOR WHEEZING OR SHORTNESS OF BREATH.  . [DISCONTINUED] irbesartan (AVAPRO) 75 MG tablet Take 75 mg by mouth daily. Take 1 tablet by mouth once daily.  . [DISCONTINUED] promethazine (PHENERGAN) 25 MG tablet Take 1 tablet (25 mg total) by mouth every 6 (six) hours as needed for nausea or vomiting.  . [DISCONTINUED] promethazine-codeine (PHENERGAN WITH CODEINE) 6.25-10 MG/5ML syrup Take 5 mLs by mouth every 6 (six) hours as needed for cough.  . [DISCONTINUED] trimethoprim (TRIMPEX) 100 MG tablet    No facility-administered encounter medications on file as of 01/17/2019.    Allergies  Allergen Reactions  . Chlordiazepoxide-Clidinium Other (See Comments)    Sleepy,weak  . Biaxin [Clarithromycin] Other (See Comments)    Foul taste, abd pain, diarrhea  . Tramadol Other (See Comments)    Caused tremors  . Codeine Other (See Comments)    REACTION: gi upset- only in high doses per pt.  Can tolerate cough syrup with codeine.    Patient Active Problem List   Diagnosis Date Noted  . Scoliosis due to degenerative disease of spine in adult patient 09/10/2018  . Radicular pain of left lower extremity 09/10/2018  . Diarrhea 11/08/2016  . Thrombocytopenia  ([place]) 11/08/2016  . Degenerative lumbar spinal stenosis 10/27/2015  . Arthralgia 09/22/2015  . Chronic respiratory failure with hypoxia ([place]) 01/11/2015  . Diabetic polyneuropathy associated with type 2 diabetes mellitus ([place]) 11/16/2014  . Essential hypertension, benign 05/19/2013  . Vertigo 05/11/2013  . Dizziness 05/11/2013  . Tachycardia 05/11/2013  . Lower urinary tract infectious disease 05/11/2013  . Sinusitis 05/11/2013  . Other and unspecified hyperlipidemia 02/15/2013  . Type II diabetes mellitus, uncontrolled ([place]) 01/01/2013  . Benign paroxysmal positional vertigo 08/09/2012  . Type II or unspecified type diabetes mellitus with neurological manifestations, uncontrolled(250.62) 08/09/2012  . GLAUCOMA 03/16/2010  . HOARSENESS 03/16/2010  . COUGH 12/23/2009  . CHEST PAIN 12/23/2009  . MUSCULOSKELETAL PAIN 01/28/2009  . COPD mixed type ([place]) 01/15/2009  . Seasonal and perennial allergic rhinitis 11/23/2007  . CHRONIC RHINITIS 07/16/2007  . G E R D 05/29/2007  . I  B S-DIARRHEAL PREDOMINATE 05/29/2007  . FATTY LIVER DISEASE 05/29/2007   Social History   Socioeconomic History  . Marital status: Single    Spouse name: Not on file  . Number of children: 0  . Years of education: Not on file  . Highest education level: Not on file  Occupational History  . Occupation: Retired   Scientific laboratory technician  . Financial resource strain: Not on file  . Food insecurity    Worry: Not on file    Inability: Not on file  . Transportation needs    Medical: Not on file    Non-medical: Not on file  Tobacco Use  . Smoking status: Former Smoker    Types: Cigarettes    Quit date: 05/08/1988    Years since quitting: 30.7  . Smokeless tobacco: Never Used  Substance and Sexual Activity  . Alcohol use: No    Alcohol/week: 0.0 standard drinks  . Drug use: No  . Sexual activity: Never  Lifestyle  . Physical activity    Days per week: Not on file    Minutes per session: Not on file  . Stress:  Not on file  Relationships  . Social Herbalist on phone: Not on file    Gets together: Not on file    Attends religious service: Not on file    Active member of club or organization: Not on file    Attends meetings of clubs or organizations: Not on file    Relationship status: Not on file  . Intimate partner violence    Fear of current or ex partner: Not on file    Emotionally abused: Not on file    Physically abused: Not on file    Forced sexual activity: Not on file  Other Topics Concern  . Not on file  Social History Narrative  . Not on file    Ms. Dirden's family history includes Diabetes in an other family member; Heart disease in her father; Liver cancer in her sister; Lung cancer in her mother and sister; Stroke in her maternal grandfather.      Objective:    Vitals:   01/17/19 0957  BP: 130/60  Pulse: 88  Temp: 98.4 F (36.9 C)    Physical Exam Well-developed well-nourished elderly white female, on O2 Ambulates with walker in no acute distress.   Weight 186, BMI 82.9 HEENT; nontraumatic normocephalic, EOMI, PER R LA, sclera anicteric. Oropharynx; not examined/mask/COVID Neck; supple, no JVD Cardiovascular; regular rate and rhythm with S1-S2, no murmur rub or gallop Pulmonary; Clear bilaterally decreased breath sounds Abdomen; examined with patient standing, abdomen is protuberant, large, asymmetric due to scoliosis ,soft,bowel sounds are active, she is nontender.  She does have rounded fullness in the left mid quadrant no definite palpable mass and unable to appreciate ventral hernia this needs to be ruled out. Rectal; not done Skin; benign exam, no jaundice rash or appreciable lesions Extremities; no clubbing cyanosis or edema skin warm and dry Neuro/Psych; alert and oriented x4, grossly nonfocal mood and affect appropriate  Assessment /Plan        #38 83 year old white female with history of IBS, diverticulosis and large duodenal diverticulum  with complaint of a bulge or swelling in her left mid abdomen noted over the past few weeks. No abdominal tenderness or discomfort. Capital rule out abdominal wall hernia, doubt intra-abdominal inflammatory process or mass.  Suspect this is more protuberance of the abdominal wall due to asymmetry with significant scoliosis.   #  2 status post cholecystectomy #3.  COPD oxygen dependent #4.  Insulin-dependent diabetes mellitus  Plan; Will schedule for CT of the abdomen with contrast. BMET Continue dicyclomine as needed, patient has not been taking Questran recently.  She can continue Imodium as needed. Other plans pending results of CT, she will follow-up with Dr. Fuller Plan or myself as needed.   Lisset Ketchem Genia Harold PA-C 01/17/2019   Cc: [name], MD

## 2019-01-17 NOTE — Patient Instructions (Signed)
If you are age 81 or older, your body mass index should be between 23-30. Your Body mass index is 32.95 kg/m. If this is out of the aforementioned range listed, please consider follow up with your Primary Care Provider.  If you are age 39 or younger, your body mass index should be between 19-25. Your Body mass index is 32.95 kg/m. If this is out of the aformentioned range listed, please consider follow up with your Primary Care Provider.   Your provider has requested that you go to the basement level for lab work before leaving today. Press "B" on the elevator. The lab is located at the first door on the left as you exit the elevator. BMET  Call back to schedule CT of abdomen and pelvis with contrast for October.  Thank you for choosing me and [facility].   Amy Esterwood, PA-C

## 2019-01-17 NOTE — Telephone Encounter (Signed)
No need for message.[name]

## 2019-01-20 ENCOUNTER — Ambulatory Visit: Payer: Medicare Other | Admitting: Orthopedic Surgery

## 2019-01-20 NOTE — Progress Notes (Signed)
Reviewed and agree with management plan.  Malcolm T. Stark, MD FACG [facility]  

## 2019-01-21 ENCOUNTER — Telehealth: Payer: Self-pay | Admitting: Internal Medicine

## 2019-01-21 NOTE — Telephone Encounter (Signed)
Spoke with Gerald Stabs at [employer]. He stated that the patient had contacted them directly in order to get a Simply Go Mini and they had faxed over an order form for Dr. Annamaria Boots to sign. Bronson Curb that the patient had recently called [place] but did not mention anything about buying the machine directly from them. She did mention that she needed to get requalified for O2 from [employer]. Advised him that I would call the patient and see exactly what is needed. He verbalized understanding.   Left message for patient to call back to get a better understanding from her.

## 2019-01-21 NOTE — Telephone Encounter (Signed)
Called and spoke with pt to inquire what exactly she needs. Pt states [employer] is requiring her to have an O2 requalifying walk for insurance purposes. She has an appt scheduled 01/27/2019 to perform this walk.   I asked her about the Simply Go Mini, which she states she had asked CY about because she saw it on a commercial. She further notes CY states she could receive one of these tanks; however, upon hearing the tank was actually up to 7 pounds, she states she isn't so sure she wants to pursue it now. She states she has spoken with [facility], who informed her the tanks are a cereal box in size and as heavy as a 1 gallon milk jug. She states she tried carrying a 1 gallon milk jug around for practice and struggles. She further notes [employer] has already approved her credit and she would be set up with monthly payments; however, that they would need notes/signature from the provider for initiation. Pt states she would like to go through with the walk test first and then decide what she wants to do.   ATC Chris with [employer] back, line went to [employer] as it is after 5:00 PM EDT. I have left a message for Gerald Stabs to give our office a call back. I am routing this message to myself for follow-up tomorrow.

## 2019-01-21 NOTE — Telephone Encounter (Signed)
Patient is returning call. CB is [phone].

## 2019-01-22 NOTE — Telephone Encounter (Addendum)
Received urgent call from [employer] via front desk. Spoke with [name], who was unsure why pt needs a qualifying walk as [employer] does not require that for POC coverage. I briefly explained to [name] that [employer] has requested pt to have a requalifying walk as pt is already on O2 with [employer]. I also mentioned how pt wanted to wait until qualifying walk to decide if she wants the Simply Go Mini. [name] inquired why pt was on the fence, so I explained to her that pt was weary about the weight of the Simply Go (see previous telephone note) and stated she wasn't sure if she'd be able to carry it around comfortable. [name] stated the Simply Go Mini tank is only 3 pounds and should be fairly manageable. [name] requested I participate in a three way call with pt to explain this to her; however, pt did not pick up at the time of the call. [name] and I left a message for pt to call [employer] back when she is able.   Pt has appt w/ TP 01/30/2019 for qualifying O2 walk per [employer]'s request. Nothing further needed at this time. Will sign off.

## 2019-01-22 NOTE — Telephone Encounter (Signed)
[employer] returning call   [phone] say it's ok to speak to any team member.Hillery Hunter

## 2019-01-22 NOTE — Telephone Encounter (Signed)
Called and spoke with rep from [employer] to inform her that pt is having a walk performed on 01/27/2019 and she would like to waiting until after the walk is performed to make a decision regarding receiving the Simply Go Mini. Rep I spoke to verbalized understanding. I requested rep to refax the request form in case pt decides to proceed with Simply Go Mini at her visit. Rep verified fax number and stated she would send the request form over ATTN Cadance Raus W. Will be on the lookout for this form. Nothing further needed at this time.

## 2019-01-27 ENCOUNTER — Ambulatory Visit: Payer: Medicare Other | Admitting: Adult Health

## 2019-01-28 ENCOUNTER — Telehealth: Payer: Self-pay | Admitting: Endocrinology

## 2019-01-28 ENCOUNTER — Telehealth: Payer: Self-pay | Admitting: Internal Medicine

## 2019-01-28 MED ORDER — STIOLTO RESPIMAT 2.5-2.5 MCG/ACT IN AERS: 2.0000 | INHALATION_SPRAY | Freq: Every day | RESPIRATORY_TRACT | 0 refills | Status: DC

## 2019-01-28 NOTE — Telephone Encounter (Signed)
Pt called wanting you to call her back. Please and Thank you!  Call pt @ [phone].

## 2019-01-28 NOTE — Telephone Encounter (Signed)
Called and spoke to pt. Pt states her Stiolo needs a PA. Called the pharmacy and was given [facility]'s number [phone], this was the pharmacy help desk number, I was transferred to the PA line. Initiated the PA over the phone, this was approved from 10/30/18-01/28/20, case #: [id number]. Pt's ID #: [id number].   Called the pharmacy [facility]) to inform them of the approval. Called and spoke to pt to update her as well. Pt is requesting a sample as the pharmacy will need to order the medication. One Stiolto sample has been left up front for pick up. Pt verbalized understanding and denied any further questions or concerns at this time.

## 2019-01-29 NOTE — Telephone Encounter (Signed)
Please let her know that I am well aware of all of the patient information.  She does not have dehydration and she is already on antibiotic prevention for bladder infection. The benefits of reducing diabetes complications and better diabetes control are very significant that we need to make use of.  She should try it till her next visit

## 2019-01-29 NOTE — Telephone Encounter (Signed)
Remind her that she was told to take [place] 3 times a week for the first 2 weeks and then daily.  Also when she starts taking it daily she will stop her irbesartan blood pressure pill and keep monitoring blood pressure

## 2019-01-29 NOTE — Telephone Encounter (Signed)
Called pt and she was reminded of this. She verbalized understanding.

## 2019-01-29 NOTE — Telephone Encounter (Signed)
Pt stated that she read the insert that she got when she picked up [place]. She is has not taken any of it as of this time. She stated that insert says do not take if you have chronic dehydration, and bladder problems. She would like to know if you are aware of this. Pt stated that she does not want to take [place].

## 2019-01-29 NOTE — Telephone Encounter (Signed)
Called pt and gave her MD message. Pt stated that she would try the medication until her next visit and she would call this office if she feels like it is not working.

## 2019-01-30 ENCOUNTER — Encounter: Payer: Self-pay | Admitting: Adult Health

## 2019-01-30 ENCOUNTER — Ambulatory Visit (INDEPENDENT_AMBULATORY_CARE_PROVIDER_SITE_OTHER): Payer: Medicare Other | Admitting: Adult Health

## 2019-01-30 ENCOUNTER — Other Ambulatory Visit: Payer: Self-pay

## 2019-01-30 DIAGNOSIS — Z23 Encounter for immunization: Secondary | ICD-10-CM | POA: Diagnosis not present

## 2019-01-30 DIAGNOSIS — J9611 Chronic respiratory failure with hypoxia: Secondary | ICD-10-CM

## 2019-01-30 DIAGNOSIS — M48061 Spinal stenosis, lumbar region without neurogenic claudication: Secondary | ICD-10-CM | POA: Diagnosis not present

## 2019-01-30 DIAGNOSIS — J449 Chronic obstructive pulmonary disease, unspecified: Secondary | ICD-10-CM | POA: Diagnosis not present

## 2019-01-30 DIAGNOSIS — J310 Chronic rhinitis: Secondary | ICD-10-CM | POA: Diagnosis not present

## 2019-01-30 NOTE — Assessment & Plan Note (Signed)
Continue on Oxygen 2l/m

## 2019-01-30 NOTE — Progress Notes (Signed)
@Patient  ID: [name], female    DOB: 1935-06-08, 83 y.o.   MRN: [id number]  Chief Complaint  Patient presents with  . Follow-up    COPD     Referring provider: [name], MD  HPI: 83 year old female former smoker followed for COPD, chronic hypoxic respiratory failure complicated by chronic rhinitis Medical history significant for diabetes, glaucoma, GERD  TEST/EVENTS :  2012- Walk test Patient Saturations on Room Air while Ambulating = 84% Patient Saturations on 2.5 Liters of oxygen while Ambulating = 92%  PFT: 01/16/2012 severe obstructive airways disease with insignificant response to bronchodilator, air trapping, diffusion moderately reduced. FEV1 0.82/45%, FEV1/FEC 0.46. Emphysema pattern on the loop. TLC 100%, RV 148%, DLCO 47%.   01/30/2019 Follow up : COPD , O2 RF   Patient presents for a 53-monthfollow-up.  Patient has underlying COPD and oxygen dependent respiratory failure.  He remains on Stiolto daily.  Uses Xopenex inahler on occasion. She says overall breathing is doing about the same.  She gets short of breath with minimum activity.  Is somewhat sedentary due to her multiple medical problems.  She remains on oxygen 2-3 L/m  O2 saturations 87% on room air.  On 2 L  of oxygen O2 saturations 95% Chest x-ray January 2020 showed clear lungs.  Uses a cane and walker to get around. Has chronic neck and back pain.   Allergies  Allergen Reactions  . Chlordiazepoxide-Clidinium Other (See Comments)    Sleepy,weak  . Biaxin [Clarithromycin] Other (See Comments)    Foul taste, abd pain, diarrhea  . Tramadol Other (See Comments)    Caused tremors  . Codeine Other (See Comments)    REACTION: gi upset- only in high doses per pt.  Can tolerate cough syrup with codeine.     Immunization History  Administered Date(s) Administered  . Influenza Split 01/07/2011, 02/22/2012  . Influenza Whole 05/09/2007, 02/05/2010, 01/22/2017  . Influenza, High Dose Seasonal PF  02/06/2018  . Influenza,inj,Quad PF,6+ Mos 01/21/2013, 03/18/2014, 01/08/2015, 01/13/2016  . Pneumococcal Conjugate-13 11/24/2013  . Pneumococcal Polysaccharide-23 04/23/2012    Past Medical History:  Diagnosis Date  . Allergic rhinitis, cause unspecified    Sinus CT Rec 12-23-2009  . Arthritis   . Benign paroxysmal positional vertigo 08/09/2012  . 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 Complication of anesthesia    takes along time to wake up  . Diabetes mellitus   . Diarrhea   . Diverticulosis   . Esophageal reflux   . Esophageal stricture   . GERD (gastroesophageal reflux disease)   . Glaucoma   . Hiatal hernia   . Hypertension   . Irritable bowel syndrome   . Neuropathy in diabetes ([place] 08/09/2012  . Other chronic nonalcoholic liver disease   . Oxygen deficiency   . Sciatica   . Scoliosis   . Shingles 2010  . Shortness of breath   . Steatohepatitis     Tobacco History: Social History   Tobacco Use  Smoking Status Former Smoker  . Types: Cigarettes  . Quit date: 05/08/1988  . Years since quitting: 30.7  Smokeless Tobacco Never Used   Counseling given: Not Answered   Outpatient Medications Prior to Visit  Medication Sig Dispense Refill  . ACCU-CHEK SOFTCLIX LANCETS lancets 1 each by Other route daily. Use as instructed to check blood sugar 8 times daily. 250 each 3  . ALPRAZolam (XANAX) 0.5 MG tablet TAKE 1/2 TO 1  TABLET TWICE DAILY AS NEEDED FOR ANXIETY. 60 tablet 0  . aspirin 325 MG tablet Take 325 mg by mouth daily.      . B-D UF III MINI PEN NEEDLES 31G X 5 MM MISC USE 5 PER DAY TO INJECT INSULIN 200 each 3  . cholestyramine (QUESTRAN) 4 g packet 1 scoop/pack twice a day as needed. 60 each 3  . Continuous Blood Gluc Receiver ([place]) [place] 1 each by Does not apply route See admin instructions. Use Dexcom G6 receiver to monitor blood glucose levels. 1 each 0  . Continuous Blood Gluc Sensor  (DEXCOM G6 SENSOR) MISC 1 each by Does not apply route See admin instructions. Apply 1 sensor to body once every 10 days. 3 each 3  . Continuous Blood Gluc Transmit (DEXCOM G6 TRANSMITTER) MISC 1 each by Does not apply route See admin instructions. Use 1 transmitter once every 90 days. 1 each 3  . dapagliflozin propanediol (FARXIGA) 5 MG TABS tablet Take 5 mg by mouth daily. 30 tablet 3  . dicyclomine (BENTYL) 10 MG capsule TAKE (1) CAPSULE TWICE DAILY. 60 capsule 0  . glucose blood (ACCU-CHEK AVIVA PLUS) test strip USE AS INSTRUCTED TO CHECK BLOOD SUGARS 8 TIMES PER DAY. DX:E11.65 250 each 2  . insulin aspart (NOVOLOG FLEXPEN) 100 UNIT/ML FlexPen Inject 12-16 units under the skin three times daily. 5 pen 2  . Insulin Pen Needle (B-D ULTRAFINE III SHORT PEN) 31G X 8 MM MISC TEST 3 TIMES A DAY 100 each 5  . Insulin Syringe-Needle U-100 (BD INSULIN SYRINGE ULTRAFINE) 31G X 15/64" 0.5 ML MISC Use 2 per day dx code E11.65 100 each 5  . Lancets Misc. (ACCU-CHEK SOFTCLIX LANCET DEV) KIT 1 each by Does not apply route daily. Use as instructed to check blood sugar 8 times daily. 1 kit 0  . magic mouthwash SOLN Swish with 2 teaspoonfuls by mouth as needed (Patient taking differently: daily as needed. Swish with 2 teaspoonfuls by mouth as needed) 240 mL 3  . meclizine (ANTIVERT) 25 MG tablet take 1 tablet by mouth three times a day if needed for dizziness/vertigo (Patient taking differently: daily as needed. take 1 tablet by mouth three times a day if needed for dizziness/vertigo) 30 tablet 3  . methenamine (MANDELAMINE) 0.5 GM tablet     . metoprolol succinate (TOPROL-XL) 25 MG 24 hr tablet TAKE 1 TABLET ONCE DAILY. 90 tablet 0  . omeprazole (PRILOSEC) 20 MG capsule TAKE (1) CAPSULE DAILY. 90 capsule 0  . OXYGEN Inhale 3 L into the lungs continuous.     . pravastatin (PRAVACHOL) 20 MG tablet TAKE 1 TABLET ONCE DAILY. 90 tablet 3  . Spacer/Aero-Holding Chambers (AEROCHAMBER MV) inhaler Use as instructed 1 each  0  . STIOLTO RESPIMAT 2.5-2.5 MCG/ACT AERS USE 2 PUFFS DAILY 4 g 6  . TRESIBA FLEXTOUCH 200 UNIT/ML SOPN INJECT 60 UNITS SUBCUTANEOUSLY ONCE DAILY. 3 pen 2  . Vitamin D, Ergocalciferol, (DRISDOL) 1.25 MG (50000 UT) CAPS capsule Take 1 capsule (50,000 Units total) by mouth once a week. 4 capsule 3  . XOPENEX HFA 45 MCG/ACT inhaler INHALE 2 PUFFS EVERY 6 HOURS IF NEEDED FOR WHEEZING OR SHORTNESS OF BREATH. 15 g 0  . ipratropium-albuterol (DUONEB) 0.5-2.5 (3) MG/3ML SOLN 1 vial in neb every 8 hours and as needed (Patient not taking: Reported on 01/30/2019) 90 mL 11  . sodium chloride HYPERTONIC 3 % nebulizer solution 1 ampule neb every 6 hours if needed to clear mucus (  Patient not taking: Reported on 01/30/2019) 75 mL 12  . Tiotropium Bromide-Olodaterol (STIOLTO RESPIMAT) 2.5-2.5 MCG/ACT AERS Inhale 2 puffs into the lungs daily. 4 g 0   No facility-administered medications prior to visit.      Review of Systems:   Constitutional:   No  weight loss, night sweats,  Fevers, chills,  +fatigue, or  lassitude.  HEENT:   No headaches,  Difficulty swallowing,  Tooth/dental problems, or  Sore throat,                No sneezing, itching, ear ache, nasal congestion, post nasal drip,   CV:  No chest pain,  Orthopnea, PND, swelling in lower extremities, anasarca, dizziness, palpitations, syncope.   GI  No heartburn, indigestion, abdominal pain, nausea, vomiting, diarrhea, change in bowel habits, loss of appetite, bloody stools.   Resp:    No wheezing.  No chest wall deformity  Skin: no rash or lesions.  GU: no dysuria, change in color of urine, no urgency or frequency.  No flank pain, no hematuria   MS: Chronic neck and back pain   Physical Exam  BP 126/74 (BP Location: Left Arm, Cuff Size: Normal)   Pulse 92   Temp (!) 97.1 F (36.2 C) (Temporal)   Ht 5' 3"  (1.6 m)   Wt 184 lb 9.6 oz (83.7 kg)   SpO2 93%   BMI 32.70 kg/m   GEN: A/Ox3; pleasant , NAD, elderly, chronically ill-appearing on  oxygen   HEENT:  /AT,  NOSE-clear, THROAT-clear, no lesions, no postnasal drip or exudate noted.   NECK:  Supple w/ fair ROM; no JVD; normal carotid impulses w/o bruits; no thyromegaly or nodules palpated; no lymphadenopathy.    RESP decreased breath sounds in the bases  no accessory muscle use, no dullness to percussion  CARD:  RRR, no m/r/g, no peripheral edema, pulses intact, no cyanosis or clubbing.  GI:   Soft & nt; nml bowel sounds; no organomegaly or masses detected.   Musco: Warm bil, no deformities or joint swelling noted.   Neuro: alert, no focal deficits noted.    Skin: Warm, no lesions or rashes    Lab Results:  CBC  BNP No results found for: BNP  ProBNP No results found for: PROBNP  Imaging: No results found.    No flowsheet data found.  No results found for: NITRICOXIDE      Assessment & Plan:   COPD mixed type ([place]) Compensated on present regimen   Plan  Patient Instructions  Continue on Stiolto 2 puffs daily , rinse after use.  Mucinex DM Twice daily  As needed  Cough/congestion  Continue on Oxygen 2l/m .  Activity as tolerated.  Flu shot today .  Follow up with Dr. Annamaria Boots  In 6 months and As needed        Chronic respiratory failure with hypoxia [facility]) Continue on Oxygen 2l/m      Rexene Edison, NP 01/30/2019

## 2019-01-30 NOTE — Assessment & Plan Note (Signed)
Chronic back pain and neck issues seem to be contributing to her overall physical deconditioning.  Activity as tolerated

## 2019-01-30 NOTE — Patient Instructions (Addendum)
Continue on Stiolto 2 puffs daily , rinse after use.  Mucinex DM Twice daily  As needed  Cough/congestion  Continue on Oxygen 2l/m .  Activity as tolerated.  Flu shot today .  Follow up with Dr. Annamaria Boots  In 6 months and As needed

## 2019-01-30 NOTE — Assessment & Plan Note (Signed)
Compensated on present regimen   Plan  Patient Instructions  Continue on Stiolto 2 puffs daily , rinse after use.  Mucinex DM Twice daily  As needed  Cough/congestion  Continue on Oxygen 2l/m .  Activity as tolerated.  Flu shot today .  Follow up with Dr. Annamaria Boots  In 6 months and As needed

## 2019-01-30 NOTE — Assessment & Plan Note (Signed)
Stable continue on current regimen .  

## 2019-02-05 ENCOUNTER — Telehealth: Payer: Self-pay | Admitting: Orthopedic Surgery

## 2019-02-05 NOTE — Telephone Encounter (Signed)
Patient left a voicemail message stating that [place] and [place] and the door hit her on her right side and knocked her down.  She has scrapes on her right knee, but her whole right side hurts.  She wanted to know if she should get x-rays.  She just wanted Dr. Jess Barters advise.  CB#[phone].  Thank you.

## 2019-02-06 NOTE — Telephone Encounter (Signed)
Made an appointment for her on 02/13/19.  She stated that she will go to the hospital if she gets any worse.  Thank you.

## 2019-02-06 NOTE — Telephone Encounter (Signed)
Noted  

## 2019-02-10 ENCOUNTER — Other Ambulatory Visit: Payer: Self-pay | Admitting: Internal Medicine

## 2019-02-10 ENCOUNTER — Other Ambulatory Visit: Payer: Self-pay | Admitting: Endocrinology

## 2019-02-12 ENCOUNTER — Telehealth: Payer: Self-pay | Admitting: Endocrinology

## 2019-02-12 NOTE — Telephone Encounter (Signed)
Pt stated that she started experiencing urine incontinence, and painful, burning urination.

## 2019-02-12 NOTE — Telephone Encounter (Signed)
She can stop the [place].  She will need to call her urologist regarding possible treatment of infection

## 2019-02-12 NOTE — Telephone Encounter (Signed)
Called pt and gave her MD message. Pt verbalized understanding.

## 2019-02-12 NOTE — Telephone Encounter (Signed)
Patient is needing to speak with [name] in regards to a reaction she is having with her dapagliflozin propanediol (FARXIGA) 5 MG TABS tablet.  Please Advise, Thanks

## 2019-02-13 ENCOUNTER — Ambulatory Visit (INDEPENDENT_AMBULATORY_CARE_PROVIDER_SITE_OTHER): Payer: Medicare Other | Admitting: Orthopedic Surgery

## 2019-02-13 ENCOUNTER — Ambulatory Visit (INDEPENDENT_AMBULATORY_CARE_PROVIDER_SITE_OTHER): Payer: Medicare Other

## 2019-02-13 ENCOUNTER — Encounter: Payer: Self-pay | Admitting: Orthopedic Surgery

## 2019-02-13 VITALS — Ht 63.0 in | Wt 184.6 lb

## 2019-02-13 DIAGNOSIS — M79671 Pain in right foot: Secondary | ICD-10-CM

## 2019-02-13 DIAGNOSIS — M25561 Pain in right knee: Secondary | ICD-10-CM

## 2019-02-13 DIAGNOSIS — M25511 Pain in right shoulder: Secondary | ICD-10-CM

## 2019-02-13 DIAGNOSIS — R351 Nocturia: Secondary | ICD-10-CM | POA: Diagnosis not present

## 2019-02-13 DIAGNOSIS — N3020 Other chronic cystitis without hematuria: Secondary | ICD-10-CM | POA: Diagnosis not present

## 2019-02-13 DIAGNOSIS — R300 Dysuria: Secondary | ICD-10-CM | POA: Diagnosis not present

## 2019-02-13 MED ORDER — MUPIROCIN 2 % EX OINT: 1.0000 "application " | TOPICAL_OINTMENT | Freq: Two times a day (BID) | CUTANEOUS | 3 refills | Status: DC

## 2019-02-14 ENCOUNTER — Telehealth: Payer: Self-pay | Admitting: Endocrinology

## 2019-02-14 NOTE — Telephone Encounter (Signed)
Patient called and requested a call back from nurse.  She has information to relay from 2 separate specialist visits from yesterday and additional information regarding her symptoms from 02/12/2019.

## 2019-02-14 NOTE — Telephone Encounter (Signed)
Noted  

## 2019-02-14 NOTE — Telephone Encounter (Signed)
Pt stated that she saw her Urology yesterday and she has a "severe" infection. She will be on two antibiotics. She also went to an ortho her knee and she has a cut and infection in her knee. Also on an antibiotic for her knees.

## 2019-02-17 ENCOUNTER — Telehealth: Payer: Self-pay | Admitting: Endocrinology

## 2019-02-17 NOTE — Telephone Encounter (Signed)
Called pt and she stated that she is in a program called [facility] to get assistance with in home care.  She states that a lady needs Dr. Dwyane Dee to sign a prescription so the pt can get free briefs for urinary incontinence.

## 2019-02-17 NOTE — Telephone Encounter (Signed)
Informed pt to give her case worker our office number and call and request to speak with me directly.

## 2019-02-17 NOTE — Telephone Encounter (Signed)
Patient requests [name] call her at ph# [phone] re: a fax that is being sent to Dr. Dwyane Dee today that needs to be signed and sent right back. Patient requests to be called asap.

## 2019-02-18 ENCOUNTER — Telehealth: Payer: Self-pay | Admitting: Endocrinology

## 2019-02-18 ENCOUNTER — Encounter: Payer: Self-pay | Admitting: Orthopedic Surgery

## 2019-02-18 NOTE — Telephone Encounter (Signed)
Called by and gave her MD message. Pt verbalized understanding. She wrote this down and repeated it back to me correctly.

## 2019-02-18 NOTE — Progress Notes (Signed)
Office Visit Note   Patient: [name]           Date of Birth: 09-20-1935           MRN: [id number] Visit Date: 02/13/2019              Requested by: [name], MD [facility] [place],  [place] [address] PCP: [name], MD  Chief Complaint  Patient presents with  . Right Knee - Pain    Patient fell 02/05/2019  . Right Shoulder - Pain  . Right Ankle - Pain      HPI: Patient is an 83 year old woman who presents status post fall.  Patient states that she fell striking her right shoulder and injuring her right knee and right ankle.  Patient states that she stood up quickly felt lightheaded and fell consistent with orthostatic hypotension.  She has completed epidural steroid injections with Dr. Ernestina Patches with no improvement in her back pain.  She states she does not want her pursue physical therapy.  Assessment & Plan: Visit Diagnoses:  1. Acute pain of right shoulder   2. Right knee pain, unspecified chronicity   3. Right foot pain     Plan: Plan: Discussed recommendation to consider skilled nursing placement.  Patient is unstable with her gait she has shortness of breath she is on nasal cannula FiO2.  Patient states that recently she fell post office.  Follow-Up Instructions: Return if symptoms worsen or fail to improve.   Ortho Exam  Patient is alert, oriented, no adenopathy, well-dressed, normal affect, normal respiratory effort. Examination patient has difficulty getting from a sitting to a standing position she is short of breath on nasal cannula at rest.  She has 2 abrasions on the right knee approximately 3 cm in diameter there is no knee effusion the collaterals and cruciates are stable no instability of the knee mechanically.  Examination of the right shoulder she has full active and passive range of motion.  No symptoms of rotator cuff pathology.  Examination the right ankle she has good range of motion no swelling no redness no abrasions.  Imaging:  No results found. No images are attached to the encounter.  Labs: Lab Results  Component Value Date   HGBA1C 10.6 (A) 12/09/2018   HGBA1C 8.8 (A) 05/09/2018   HGBA1C 8.2 (A) 02/06/2018   LABURIC 4.1 11/16/2014   REPTSTATUS 11/09/2016 FINAL 11/08/2016   CULT NO GROWTH 11/08/2016   LABORGA STAPHYLOCOCCUS SPECIES (COAGULASE NEGATIVE) 05/24/2015     Lab Results  Component Value Date   ALBUMIN 4.1 12/09/2018   ALBUMIN 4.1 02/06/2018   ALBUMIN 4.0 11/06/2017   LABURIC 4.1 11/16/2014    Lab Results  Component Value Date   MG 1.6 12/20/2016   MG 1.7 10/18/2015   Lab Results  Component Value Date   VD25OH 38.42 12/09/2018   VD25OH 19.95 (L) 02/06/2018    No results found for: PREALBUMIN CBC EXTENDED Latest Ref Rng & Units 11/06/2017 05/07/2017 11/10/2016  WBC 4.0 - 10.5 K/uL 11.2(H) 8.5 6.9  RBC 3.87 - 5.11 Mil/uL 4.92 5.15(H) 4.75  HGB 12.0 - 15.0 g/dL 12.8 13.5 12.6  HCT 36.0 - 46.0 % 41.1 43.0 42.4  PLT 150.0 - 400.0 K/uL 181.0 187.0 173  NEUTROABS 1.4 - 7.7 K/uL 6.1 4.8 -  LYMPHSABS 0.7 - 4.0 K/uL 4.2(H) 3.1 -     Body mass index is 32.7 kg/m.  Orders:  Orders Placed This Encounter  Procedures  .  XR Knee 1-2 Views Right  . XR Shoulder Right  . XR Ankle 2 Views Right   Meds ordered this encounter  Medications  . mupirocin ointment (BACTROBAN) 2 %    Sig: Apply 1 application topically 2 (two) times daily. Apply to the affected area 2 times a day    Dispense:  22 g    Refill:  3     Procedures: No procedures performed  Clinical Data: No additional findings.  ROS:  All other systems negative, except as noted in the HPI. Review of Systems  Objective: Vital Signs: Ht 5' 3"  (1.6 m)   Wt 184 lb 9.6 oz (83.7 kg)   BMI 32.70 kg/m   Specialty Comments:  No specialty comments available.  PMFS History: Patient Active Problem List   Diagnosis Date Noted  . Scoliosis due to degenerative disease of spine in adult patient 09/10/2018  . Radicular pain of  left lower extremity 09/10/2018  . Diarrhea 11/08/2016  . Thrombocytopenia ([place]) 11/08/2016  . Degenerative lumbar spinal stenosis 10/27/2015  . Arthralgia 09/22/2015  . Chronic respiratory failure with hypoxia ([place]) 01/11/2015  . Diabetic polyneuropathy associated with type 2 diabetes mellitus ([place]) 11/16/2014  . Essential hypertension, benign 05/19/2013  . Vertigo 05/11/2013  . Dizziness 05/11/2013  . Tachycardia 05/11/2013  . Lower urinary tract infectious disease 05/11/2013  . Sinusitis 05/11/2013  . Other and unspecified hyperlipidemia 02/15/2013  . Type II diabetes mellitus, uncontrolled ([place]) 01/01/2013  . Benign paroxysmal positional vertigo 08/09/2012  . Type II or unspecified type diabetes mellitus with neurological manifestations, uncontrolled(250.62) 08/09/2012  . GLAUCOMA 03/16/2010  . HOARSENESS 03/16/2010  . COUGH 12/23/2009  . CHEST PAIN 12/23/2009  . MUSCULOSKELETAL PAIN 01/28/2009  . COPD mixed type ([place]) 01/15/2009  . Seasonal and perennial allergic rhinitis 11/23/2007  . CHRONIC RHINITIS 07/16/2007  . G E R D 05/29/2007  . I B S-DIARRHEAL PREDOMINATE 05/29/2007  . FATTY LIVER DISEASE 05/29/2007   Past Medical History:  Diagnosis Date  . Allergic rhinitis, cause unspecified    Sinus CT Rec 12-23-2009  . Arthritis   . Benign paroxysmal positional vertigo 08/09/2012  . 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 Complication of anesthesia    takes along time to wake up  . Diabetes mellitus   . Diarrhea   . Diverticulosis   . Esophageal reflux   . Esophageal stricture   . GERD (gastroesophageal reflux disease)   . Glaucoma   . Hiatal hernia   . Hypertension   . Irritable bowel syndrome   . Neuropathy in diabetes ([place]) 08/09/2012  . Other chronic nonalcoholic liver disease   . Oxygen deficiency   . Sciatica   . Scoliosis   . Shingles 2010  . Shortness of breath   . Steatohepatitis     Family History   Problem Relation Age of Onset  . Heart disease Father   . Lung cancer Mother        small cell;Byssinosis  . Lung cancer Sister   . Liver cancer Sister        ? mets from another area of the body  . Diabetes Other        grandmother  . Stroke Maternal Grandfather     Past Surgical History:  Procedure Laterality Date  . APPENDECTOMY    . CARPAL TUNNEL RELEASE     right hand  . CHOLECYSTECTOMY OPEN  1978  . COLONOSCOPY  07-2001  mild diverticulosis  . ESOPHAGOGASTRODUODENOSCOPY  9311,21-62   H Hernia,es.stricture s/p dil 38F  . INTRAOCULAR LENS INSERTION Bilateral   . LIVER BIOPSY  [phone]  . PARATHYROID EXPLORATION    . TONSILLECTOMY AND ADENOIDECTOMY    . TOTAL ABDOMINAL HYSTERECTOMY    . ULNAR NERVE TRANSPOSITION  12/07/2011   Procedure: ULNAR NERVE DECOMPRESSION/TRANSPOSITION;this was cancelled-not done  Surgeon: Cammie Sickle., MD;  Location: [facility];  Service: Orthopedics;  Laterality: Right;  right ulnar nerve in situ decompression  . ULNAR TUNNEL RELEASE  03/07/2012   Procedure: CUBITAL TUNNEL RELEASE;  Surgeon: Roseanne Kaufman, MD;  Location: [facility];  Service: Orthopedics;  Laterality: Right;  ulnar nerve release at the elbow     Social History   Occupational History  . Occupation: Retired   Tobacco Use  . Smoking status: Former Smoker    Types: Cigarettes    Quit date: 05/08/1988    Years since quitting: 30.8  . Smokeless tobacco: Never Used  Substance and Sexual Activity  . Alcohol use: No    Alcohol/week: 0.0 standard drinks  . Drug use: No  . Sexual activity: Never

## 2019-02-18 NOTE — Telephone Encounter (Signed)
She will take additional 4 units when blood sugars are between 200 and 250, if over 250 she will take 6 units extra Humalog/NovoLog.  If her morning sugars are consistently over 200 she will increase Tresiba by 6 units

## 2019-02-18 NOTE — Telephone Encounter (Signed)
Called pt and she stated that her urologist "gave her the wrong antibiotic" and they just changed it to a new medication. She stated that her blood sugar in the middle of the night last night was over 300 and she would like to know if she should adjust her insulin dose at all.  I also got the number for her case worker that has been attempting to fax forms to this office to get incontinence supplies for the pt. As of this time, we still have not received the fax that Raja stated in a voicemail she had sent. Raja, the case manager left a voicemail on my line stating that she spoke with the front office staff and verified the fax number, however, one of the fax machines in this office has been out of order all day. Attempted to call Raja at [phone] and give Raja a new fax number so this paperwork can be filled out and completed for the pt.

## 2019-02-18 NOTE — Telephone Encounter (Signed)
Patient called and asked for a call back from Craig. She wanted to talk about the antibiotics and infections from earlier this week, because of the symptoms she discussed with you earlier this week.

## 2019-02-19 ENCOUNTER — Telehealth: Payer: Self-pay | Admitting: Endocrinology

## 2019-02-19 NOTE — Telephone Encounter (Signed)
Patient transferred to [facility]

## 2019-02-21 ENCOUNTER — Telehealth: Payer: Self-pay | Admitting: Endocrinology

## 2019-02-21 ENCOUNTER — Other Ambulatory Visit: Payer: Self-pay

## 2019-02-21 NOTE — Telephone Encounter (Signed)
There is no significance to salty taste

## 2019-02-21 NOTE — Telephone Encounter (Signed)
When completing COVID-19 prescreening appointment patient stated that she has had a salty taste in her mouth since dental work was completed.  She is concerned that this is something serious.

## 2019-02-25 ENCOUNTER — Ambulatory Visit: Payer: Medicare Other | Admitting: Endocrinology

## 2019-02-27 ENCOUNTER — Telehealth: Payer: Self-pay

## 2019-02-27 NOTE — Telephone Encounter (Signed)
Left message for patient to return call regarding getting her CT scan scheduled this month (pt wanted to wait until Oct.)

## 2019-02-27 NOTE — Telephone Encounter (Signed)
Spoke with patient.  She states she has been sick and not able to schedule her CT.  She states she is getting a new CNA on Monday and is going to see how that works out and she will call to schedule CT.  She is aware she will need labs again before CT can be done.

## 2019-03-10 ENCOUNTER — Other Ambulatory Visit: Payer: Self-pay | Admitting: Endocrinology

## 2019-03-17 ENCOUNTER — Telehealth: Payer: Self-pay | Admitting: Internal Medicine

## 2019-03-17 NOTE — Telephone Encounter (Signed)
Attemted to contact x4. Line rings busy everytime. Will try back later.

## 2019-03-18 ENCOUNTER — Telehealth: Payer: Self-pay | Admitting: Internal Medicine

## 2019-03-18 NOTE — Telephone Encounter (Signed)
Try "Tylenol Sinus and Headache"  Which is an otc decongestant and headache tablet.

## 2019-03-18 NOTE — Telephone Encounter (Signed)
Call returned to patient, confirmed DOB, requesting the CY send in something for her head congestion. She states she thinks she has a head cold. Her nose is stopped up. She reports constant sneezing and her voice is coming and going. She reports it started with a itchy throat and slowyl progressed. She reports her sinuses are inflamed. She reports she started feeling bad last Saturday and after coughing she developed the congestion. She is requesting something be called in. She states she has not had a aide in several weeks due to covid. She states she would come in if she could but she no longer is able to drive. She reports her pharmacy will deliver her medication. She states if we get it sent in early enough she will be able to get the medication today.   CY please advise. Thanks.

## 2019-03-18 NOTE — Telephone Encounter (Signed)
Spoke with patient. She stated that she called her pharmacy and they only had Tylenol Sinus Severe vs what CY had advised for her. She stated that she had taken this in the past and it worked well for her but she wanted to know if it was similar to CY's recommendations. Reviewed the medications online with the patient and made her aware that they were the same medications. She verbalized understanding.   Nothing further needed at time of call.

## 2019-03-18 NOTE — Telephone Encounter (Signed)
Patient is returning the call. CB is [phone].

## 2019-03-18 NOTE — Telephone Encounter (Signed)
Spoke with patient. She stated that she will call her pharmacy to see if they can deliver the medication to her. If not, she will get one of her friends to go by the pharmacy for her. Advised her to call us back if she is not feeling any better by Thursday or Friday, she verbalized understanding.   Nothing further needed at time of call.

## 2019-03-21 ENCOUNTER — Telehealth: Payer: Self-pay | Admitting: Internal Medicine

## 2019-03-21 MED ORDER — AMOXICILLIN 500 MG PO CAPS: 500.0000 mg | ORAL_CAPSULE | Freq: Two times a day (BID) | ORAL | 0 refills | Status: DC

## 2019-03-21 NOTE — Telephone Encounter (Signed)
Spoke with the pt  She is c/o burning sensation at the top of her nose and states has some swelling around her nose  She states thinks she is having some sinus issues  She is taking tylenol sinus otc without relief  She is coughing with thick, yellow sputum, hard to produce  She states her throat feel itchy  She is not having fevers, chills, body aches, increased dyspnea or wheezing  She is taking her stiolto daily and rarely uses her xopenex inhaler and never uses hypertonic saline nebs  Please advise, thanks! Allergies  Allergen Reactions  . Chlordiazepoxide-Clidinium Other (See Comments)    Sleepy,weak  . Biaxin [Clarithromycin] Other (See Comments)    Foul taste, abd pain, diarrhea  . Tramadol Other (See Comments)    Caused tremors  . Codeine Other (See Comments)    REACTION: gi upset- only in high doses per pt.  Can tolerate cough syrup with codeine.     Current Outpatient Medications on File Prior to Visit  Medication Sig Dispense Refill  . ACCU-CHEK SOFTCLIX LANCETS lancets 1 each by Other route daily. Use as instructed to check blood sugar 8 times daily. 250 each 3  . ALPRAZolam (XANAX) 0.5 MG tablet TAKE 1/2 TO 1 TABLET TWICE DAILY AS NEEDED FOR ANXIETY. 60 tablet 0  . aspirin 325 MG tablet Take 325 mg by mouth daily.      . B-D UF III MINI PEN NEEDLES 31G X 5 MM MISC USE 5 PER DAY TO INJECT INSULIN 200 each 3  . cholestyramine (QUESTRAN) 4 g packet 1 scoop/pack twice a day as needed. 60 each 3  . Continuous Blood Gluc Receiver ([place]) [place] 1 each by Does not apply route See admin instructions. Use Dexcom G6 receiver to monitor blood glucose levels. 1 each 0  . Continuous Blood Gluc Sensor (DEXCOM G6 SENSOR) MISC 1 each by Does not apply route See admin instructions. Apply 1 sensor to body once every 10 days. 3 each 3  . Continuous Blood Gluc Transmit (DEXCOM G6 TRANSMITTER) MISC 1 each by Does not apply route See admin instructions. Use 1 transmitter once every 90  days. 1 each 3  . dapagliflozin propanediol (FARXIGA) 5 MG TABS tablet Take 5 mg by mouth daily. 30 tablet 3  . dicyclomine (BENTYL) 10 MG capsule TAKE (1) CAPSULE TWICE DAILY. 60 capsule 0  . glucose blood (ACCU-CHEK AVIVA PLUS) test strip USE AS INSTRUCTED TO CHECK BLOOD SUGARS 8 TIMES PER DAY. DX:E11.65 250 each 2  . insulin aspart (NOVOLOG FLEXPEN) 100 UNIT/ML FlexPen Inject 12-16 units under the skin three times daily. 5 pen 2  . Insulin Pen Needle (B-D ULTRAFINE III SHORT PEN) 31G X 8 MM MISC TEST 3 TIMES A DAY 100 each 5  . Insulin Syringe-Needle U-100 (BD INSULIN SYRINGE ULTRAFINE) 31G X 15/64" 0.5 ML MISC Use 2 per day dx code E11.65 100 each 5  . ipratropium-albuterol (DUONEB) 0.5-2.5 (3) MG/3ML SOLN 1 vial in neb every 8 hours and as needed 90 mL 11  . Lancets Misc. (ACCU-CHEK SOFTCLIX LANCET DEV) KIT 1 each by Does not apply route daily. Use as instructed to check blood sugar 8 times daily. 1 kit 0  . magic mouthwash SOLN Swish with 2 teaspoonfuls by mouth as needed (Patient taking differently: daily as needed. Swish with 2 teaspoonfuls by mouth as needed) 240 mL 3  . meclizine (ANTIVERT) 25 MG tablet take 1 tablet by mouth three times a day if  needed for dizziness/vertigo (Patient taking differently: daily as needed. take 1 tablet by mouth three times a day if needed for dizziness/vertigo) 30 tablet 3  . methenamine (MANDELAMINE) 0.5 GM tablet     . metoCLOPramide (REGLAN) 5 MG tablet TAKE  (1)  TABLET  FOUR TIMES DAILY. 90 tablet 0  . metoprolol succinate (TOPROL-XL) 25 MG 24 hr tablet TAKE 1 TABLET ONCE DAILY. 90 tablet 0  . mupirocin ointment (BACTROBAN) 2 % Apply 1 application topically 2 (two) times daily. Apply to the affected area 2 times a day 22 g 3  . omeprazole (PRILOSEC) 20 MG capsule TAKE (1) CAPSULE DAILY. 90 capsule 0  . OXYGEN Inhale 3 L into the lungs continuous.     . pravastatin (PRAVACHOL) 20 MG tablet TAKE 1 TABLET ONCE DAILY. 90 tablet 3  . sodium chloride  HYPERTONIC 3 % nebulizer solution 1 ampule neb every 6 hours if needed to clear mucus 75 mL 12  . Spacer/Aero-Holding Chambers (AEROCHAMBER MV) inhaler Use as instructed 1 each 0  . STIOLTO RESPIMAT 2.5-2.5 MCG/ACT AERS USE 2 PUFFS DAILY 4 g 6  . TRESIBA FLEXTOUCH 200 UNIT/ML SOPN INJECT 60 UNITS SUBCUTANEOUSLY ONCE DAILY. 3 pen 2  . Vitamin D, Ergocalciferol, (DRISDOL) 1.25 MG (50000 UT) CAPS capsule Take 1 capsule (50,000 Units total) by mouth once a week. 4 capsule 3  . XOPENEX HFA 45 MCG/ACT inhaler INHALE 2 PUFFS EVERY 6 HOURS IF NEEDED FOR WHEEZING OR SHORTNESS OF BREATH. 15 g 0   No current facility-administered medications on file prior to visit.

## 2019-03-21 NOTE — Telephone Encounter (Signed)
Suggest otc saline nasal spray, 1-2 puffs each nostril twice daily as needed Ok to offer amoxacillin 500 mg, # 14, 1 twice daily

## 2019-03-21 NOTE — Telephone Encounter (Signed)
Spoke with the pt and notified of recs per CDY  She verbalized understanding  Rx was sent

## 2019-03-25 ENCOUNTER — Telehealth: Payer: Self-pay | Admitting: Internal Medicine

## 2019-03-25 MED ORDER — PROMETHAZINE-CODEINE 6.25-10 MG/5ML PO SYRP: 5.0000 mL | ORAL_SOLUTION | Freq: Four times a day (QID) | ORAL | 0 refills | Status: DC | PRN

## 2019-03-25 MED ORDER — AZITHROMYCIN 250 MG PO TABS: ORAL_TABLET | ORAL | 0 refills | Status: DC

## 2019-03-25 NOTE — Telephone Encounter (Signed)
Called and spoke to pt. Informed her of the recs per CY. Pt verbalized understanding and denied any further questions or concerns at this time.   

## 2019-03-25 NOTE — Telephone Encounter (Signed)
Since sputum is turning green and not clearing up, that suggests amoxacillin antibiotic is not covering this infection. I have e-sent scripts to [facility] for Zpak and for codeine cough syrup. She has tolerated these in past.

## 2019-03-25 NOTE — Telephone Encounter (Signed)
Call returned to patient, confirmed DOB, she states she was given an antibiotic and has about 5 does left but she is now concerned because she has developed green mucous while taking antibiotic and her cough does not seem to be improving. She is requesting something for her cough. Offered a tele-visit, patient declined stating CY has been seeing me for 20 years why do I need to speak with another provider. I made her aware it was only because CY had not slots available.She requests recommendations from CY.   CY please advise. Thanks.

## 2019-03-26 ENCOUNTER — Telehealth: Payer: Self-pay | Admitting: Endocrinology

## 2019-03-26 NOTE — Telephone Encounter (Signed)
Form has been faxed to number below.

## 2019-03-26 NOTE — Telephone Encounter (Signed)
[facility] requested that we resend the CMN to [phone]

## 2019-04-08 ENCOUNTER — Encounter: Payer: Self-pay | Admitting: Primary Care

## 2019-04-08 ENCOUNTER — Other Ambulatory Visit: Payer: Self-pay

## 2019-04-08 ENCOUNTER — Ambulatory Visit (INDEPENDENT_AMBULATORY_CARE_PROVIDER_SITE_OTHER): Payer: Medicare Other | Admitting: Primary Care

## 2019-04-08 DIAGNOSIS — J441 Chronic obstructive pulmonary disease with (acute) exacerbation: Secondary | ICD-10-CM

## 2019-04-08 DIAGNOSIS — J0101 Acute recurrent maxillary sinusitis: Secondary | ICD-10-CM | POA: Diagnosis not present

## 2019-04-08 MED ORDER — PREDNISONE 10 MG PO TABS: ORAL_TABLET | ORAL | 0 refills | Status: DC

## 2019-04-08 MED ORDER — AZELASTINE HCL 0.1 % NA SOLN: 1.0000 | Freq: Two times a day (BID) | NASAL | 1 refills | Status: DC

## 2019-04-08 NOTE — Patient Instructions (Signed)
COPD exacerbation/Sinusitis: Start Astelin nasal spray twice daily Take prednisone taper as prescribed for 10 days Take mucinex twice a day for 1-2 weeks  Holding off on further antibiotic- please let us know if sputum color changes or worsens   RX: Prednisone 24m x 5 days; 157mx 5 days; then stop Astelin nasal spray twice daily   Follow-up: If not better in 3-5 days will send in doxycycline course

## 2019-04-08 NOTE — Progress Notes (Addendum)
Virtual Visit via Telephone Note  I connected with [name] on 04/08/19 at  2:30 PM EST by telephone and verified that I am speaking with the correct person using two identifiers.  Location: Patient: Home Provider: Office   I discussed the limitations, risks, security and privacy concerns of performing an evaluation and management service by telephone and the availability of in person appointments. I also discussed with the patient that there may be a patient responsible charge related to this service. The patient expressed understanding and agreed to proceed.   History of Present Illness: 83 year old female, former smoker. PMH significant for mixed COPD, chronic respiratory failure with hypoxia, chronic rhinitis. Patient of Dr. Annamaria Boots, last seen on by pulmonary NP on 01/30/19. Treated for sinusitis symptoms on 03/25/19 with Zpack.   04/08/2019 Patient contacted today for acute televisit. Reports post nasal drip and sneezing x 1 week. Associated chest tightness and wheezing. She did improve after zpack but some of her symptoms return soon after completing. States that she feels pretty good but is tired. She is not getting up consistently colored mucus. Continues Stiolto as prescribed, she has used her Xopenex rescue inhaler once with some improvement. Denies fever, chills, sweats or sick contact.     Observations/Objective:  - No significant shortness of breath, wheezing or cough noted during phone conversation - Able to speak in full sentences  Assessment and Plan:  COPD exacerbation: - RX prednisone 5m x 5 days; 159mx 5 days  - Continue Stiolto 1 puffs once daily; prn xopenex hfa q 6 hours   Sinusitis: - Recently treated with Amoxicillin and Zpack - Holding off on further antibiotics at this time - Astelin nasal spray 1 puff per nostril twice daily for PND  - Recommend mucinex twice daily    Follow Up Instructions: - If symptoms do not improve in 3-5 days notify  office   I discussed the assessment and treatment plan with the patient. The patient was provided an opportunity to ask questions and all were answered. The patient agreed with the plan and demonstrated an understanding of the instructions.   The patient was advised to call back or seek an in-person evaluation if the symptoms worsen or if the condition fails to improve as anticipated.  I provided 18 minutes of non-face-to-face time during this encounter.   ElMartyn EhrichNP

## 2019-04-10 DIAGNOSIS — N3020 Other chronic cystitis without hematuria: Secondary | ICD-10-CM | POA: Diagnosis not present

## 2019-04-11 ENCOUNTER — Telehealth: Payer: Self-pay | Admitting: Internal Medicine

## 2019-04-11 NOTE — Telephone Encounter (Signed)
I called [facility] but they are closed. We can call Levada Dy on Monday.

## 2019-04-14 ENCOUNTER — Telehealth: Payer: Self-pay

## 2019-04-14 ENCOUNTER — Telehealth: Payer: Self-pay | Admitting: Internal Medicine

## 2019-04-14 NOTE — Telephone Encounter (Signed)
LMTCB

## 2019-04-14 NOTE — Telephone Encounter (Signed)
Please see closed telephone message dated 04/11/2019.    Per CY, for the O2 DME order that was faxed to me, pt will need to come in & see him or a NP to re-qualify for O2.

## 2019-04-14 NOTE — Telephone Encounter (Signed)
error 

## 2019-04-14 NOTE — Telephone Encounter (Signed)
Spoke with Seth Bake  She is needing CMN for o2 to be done  Since Rodena Piety is out I have asked that this be faxed to [facility] fax machine per [facility] request  Nothing further needed

## 2019-04-15 NOTE — Telephone Encounter (Signed)
Left detailed message on VM stating that her insurance is requiring her to come in to the office to give her oxygen. Will await call back.

## 2019-04-15 NOTE — Telephone Encounter (Signed)
Pt returning call. [phone].

## 2019-04-15 NOTE — Telephone Encounter (Signed)
Spoke with [name], advised her that she would need a qualifying walk for her insurance to pay for her oxygen. She reported that she was unable to come in because she is sick and doesn't think she should be out anywhere. She stated that she just had a qualifying walk when she came in to see TP. I explained to the patient that we sent the order and this is what her insurance is requesting and that we have no control over this. Dr. Annamaria Boots do you have any other suggestions for [name]? I cant find the qualifying walk from that day she saw TP.

## 2019-04-15 NOTE — Telephone Encounter (Signed)
Patient is returning phone call.  Patient phone number is [phone].  May leave detailed message on home phone.

## 2019-04-16 NOTE — Telephone Encounter (Signed)
The pt's last ambulatory walk with our office was on 01/30/2019. This test is too old to be used for insurance purposes.  LMTCB x1 for pt.

## 2019-04-16 NOTE — Telephone Encounter (Signed)
She did one months ago, outside the time range the DME would accept. If you have time, could you look for that to confirm date, then discuss with her DME please to see what they can do. Years go, they used to be able to send someone to the home to do a qualifying test there.

## 2019-04-16 NOTE — Telephone Encounter (Signed)
Spoke with the pt and she is not willing to make the appt right now  I am not understanding why she needs this recert so soon, we just did it on 01/30/19  [facility] with [facility] and she is going to look into this  In the meantime the pt states she is going to try and figure a way to get POC out of pocket without dealing with a DME  Will await a call back to see what Lenna Sciara says

## 2019-04-23 ENCOUNTER — Telehealth: Payer: Self-pay | Admitting: Internal Medicine

## 2019-04-23 DIAGNOSIS — J9611 Chronic respiratory failure with hypoxia: Secondary | ICD-10-CM

## 2019-04-23 NOTE — Telephone Encounter (Signed)
Yes okay to send notes

## 2019-04-23 NOTE — Telephone Encounter (Signed)
LMTCB with Meryl Crutch

## 2019-04-23 NOTE — Telephone Encounter (Signed)
[name] from [place] checking status of forms faxed to [place].  [name] phone number is [phone]. Fax number [phone].

## 2019-04-23 NOTE — Telephone Encounter (Signed)
Spoke with Angie with Home o2 to you  Pt is trying to get small battery operated POC and home concentrator from them since she could not get from [facility] due to them trying to make her come back in to recertify  She was seen and qualified 01/30/2019 on visit with TP   CDY- is this ok with you to fax her ov notes to them (fax number is [phone])?

## 2019-04-24 NOTE — Telephone Encounter (Signed)
Order placed for POC through [place].  [facility] will ensure that info is faxed with order.  Nothing further needed at this time- will close encounter.

## 2019-04-24 NOTE — Telephone Encounter (Signed)
There are multiple phone messages regarding this patient's oxygen.   Checked CY's cubby and with Horris Latino, we do not have forms on this patient.   I called [facility] and spoke to [facility], who is refaxing the CMN to our office.  Will await fax.

## 2019-04-25 NOTE — Telephone Encounter (Signed)
Paperwork filled out, signed by Dr. Annamaria Boots, and given back to [place]. Nothing further needed.

## 2019-04-25 NOTE — Telephone Encounter (Signed)
[name], if you are still needing someone to help with the paperwork I can take a look at it.

## 2019-04-25 NOTE — Telephone Encounter (Signed)
Rodena Piety can you look out for this CMN please.

## 2019-04-25 NOTE — Telephone Encounter (Signed)
There is no CMN they keep asking for a new 02 order. The last office note has been faxed. Dr. Annamaria Boots sent the last 02 test results. The letter faxed to [place] again states please put on the date of 01/30/2019 or within the 30 day window period from patient;s 9/24 appt and please check off the tank portabilty. Who ever is helping with this message I have the paperwork if you would like to see it

## 2019-04-28 ENCOUNTER — Telehealth: Payer: Self-pay | Admitting: Internal Medicine

## 2019-04-28 ENCOUNTER — Other Ambulatory Visit: Payer: Self-pay | Admitting: Internal Medicine

## 2019-04-28 NOTE — Telephone Encounter (Signed)
LVMCTB x 1 with [facility] as their switchboard was closed.  Requested more detailed information regarding the script they need dated 01/30/19.  A DME order for O2 was placed 04/24/19. Need to confirm their request was not related to the order placed on 04/24/19.

## 2019-04-29 ENCOUNTER — Telehealth: Payer: Self-pay | Admitting: Internal Medicine

## 2019-04-29 MED ORDER — PROMETHAZINE-CODEINE 6.25-10 MG/5ML PO SYRP: 5.0000 mL | ORAL_SOLUTION | Freq: Four times a day (QID) | ORAL | 0 refills | Status: DC | PRN

## 2019-04-29 NOTE — Telephone Encounter (Signed)
Cough syrup refill e-sent to [facility]

## 2019-04-29 NOTE — Telephone Encounter (Signed)
Pt called requesting to have her phenergan with codeine med refilled. Dr. Annamaria Boots, please advise if you are okay refilling this for pt.  Allergies  Allergen Reactions  . Chlordiazepoxide-Clidinium Other (See Comments)    Sleepy,weak  . Biaxin [Clarithromycin] Other (See Comments)    Foul taste, abd pain, diarrhea  . Tramadol Other (See Comments)    Caused tremors  . Codeine Other (See Comments)    REACTION: gi upset- only in high doses per pt.  Can tolerate cough syrup with codeine.      Current Outpatient Medications:  .  ACCU-CHEK SOFTCLIX LANCETS lancets, 1 each by Other route daily. Use as instructed to check blood sugar 8 times daily., Disp: 250 each, Rfl: 3 .  ALPRAZolam (XANAX) 0.5 MG tablet, TAKE 1/2 TO 1 TABLET TWICE DAILY AS NEEDED FOR ANXIETY., Disp: 60 tablet, Rfl: 0 .  aspirin 325 MG tablet, Take 325 mg by mouth daily.  , Disp: , Rfl:  .  azelastine (ASTELIN) 0.1 % nasal spray, Place 1 spray into both nostrils 2 (two) times daily. Use in each nostril as directed, Disp: 30 mL, Rfl: 1 .  B-D UF III MINI PEN NEEDLES 31G X 5 MM MISC, USE 5 PER DAY TO INJECT INSULIN, Disp: 200 each, Rfl: 3 .  cholestyramine (QUESTRAN) 4 g packet, 1 scoop/pack twice a day as needed., Disp: 60 each, Rfl: 3 .  Continuous Blood Gluc Receiver ([place]) DEVI, 1 each by Does not apply route See admin instructions. Use Dexcom G6 receiver to monitor blood glucose levels., Disp: 1 each, Rfl: 0 .  Continuous Blood Gluc Sensor (DEXCOM G6 SENSOR) MISC, 1 each by Does not apply route See admin instructions. Apply 1 sensor to body once every 10 days., Disp: 3 each, Rfl: 3 .  Continuous Blood Gluc Transmit (DEXCOM G6 TRANSMITTER) MISC, 1 each by Does not apply route See admin instructions. Use 1 transmitter once every 90 days., Disp: 1 each, Rfl: 3 .  dapagliflozin propanediol (FARXIGA) 5 MG TABS tablet, Take 5 mg by mouth daily., Disp: 30 tablet, Rfl: 3 .  dicyclomine (BENTYL) 10 MG capsule, TAKE (1)  CAPSULE TWICE DAILY., Disp: 60 capsule, Rfl: 0 .  glucose blood (ACCU-CHEK AVIVA PLUS) test strip, USE AS INSTRUCTED TO CHECK BLOOD SUGARS 8 TIMES PER DAY. DX:E11.65, Disp: 250 each, Rfl: 2 .  insulin aspart (NOVOLOG FLEXPEN) 100 UNIT/ML FlexPen, Inject 12-16 units under the skin three times daily., Disp: 5 pen, Rfl: 2 .  Insulin Pen Needle (B-D ULTRAFINE III SHORT PEN) 31G X 8 MM MISC, TEST 3 TIMES A DAY, Disp: 100 each, Rfl: 5 .  Insulin Syringe-Needle U-100 (BD INSULIN SYRINGE ULTRAFINE) 31G X 15/64" 0.5 ML MISC, Use 2 per day dx code E11.65, Disp: 100 each, Rfl: 5 .  ipratropium-albuterol (DUONEB) 0.5-2.5 (3) MG/3ML SOLN, 1 vial in neb every 8 hours and as needed, Disp: 90 mL, Rfl: 11 .  Lancets Misc. (ACCU-CHEK SOFTCLIX LANCET DEV) KIT, 1 each by Does not apply route daily. Use as instructed to check blood sugar 8 times daily., Disp: 1 kit, Rfl: 0 .  magic mouthwash SOLN, Swish with 2 teaspoonfuls by mouth as needed (Patient taking differently: daily as needed. Swish with 2 teaspoonfuls by mouth as needed), Disp: 240 mL, Rfl: 3 .  meclizine (ANTIVERT) 25 MG tablet, take 1 tablet by mouth three times a day if needed for dizziness/vertigo (Patient taking differently: daily as needed. take 1 tablet by mouth three times a day  if needed for dizziness/vertigo), Disp: 30 tablet, Rfl: 3 .  methenamine (MANDELAMINE) 0.5 GM tablet, , Disp: , Rfl:  .  metoCLOPramide (REGLAN) 5 MG tablet, TAKE  (1)  TABLET  FOUR TIMES DAILY., Disp: 90 tablet, Rfl: 0 .  metoprolol succinate (TOPROL-XL) 25 MG 24 hr tablet, TAKE 1 TABLET ONCE DAILY., Disp: 90 tablet, Rfl: 0 .  mupirocin ointment (BACTROBAN) 2 %, Apply 1 application topically 2 (two) times daily. Apply to the affected area 2 times a day, Disp: 22 g, Rfl: 3 .  omeprazole (PRILOSEC) 20 MG capsule, TAKE (1) CAPSULE DAILY., Disp: 90 capsule, Rfl: 0 .  OXYGEN, Inhale 3 L into the lungs continuous. , Disp: , Rfl:  .  pravastatin (PRAVACHOL) 20 MG tablet, TAKE 1 TABLET  ONCE DAILY., Disp: 90 tablet, Rfl: 3 .  predniSONE (DELTASONE) 10 MG tablet, Take 2 tabs daily x 5 days; 1 tab daily x 5 days, Disp: 15 tablet, Rfl: 0 .  promethazine-codeine (PHENERGAN WITH CODEINE) 6.25-10 MG/5ML syrup, Take 5 mLs by mouth every 6 (six) hours as needed for cough., Disp: 180 mL, Rfl: 0 .  sodium chloride HYPERTONIC 3 % nebulizer solution, 1 ampule neb every 6 hours if needed to clear mucus, Disp: 75 mL, Rfl: 12 .  Spacer/Aero-Holding Chambers (AEROCHAMBER MV) inhaler, Use as instructed, Disp: 1 each, Rfl: 0 .  STIOLTO RESPIMAT 2.5-2.5 MCG/ACT AERS, USE 2 PUFFS DAILY, Disp: 4 g, Rfl: 6 .  TRESIBA FLEXTOUCH 200 UNIT/ML SOPN, INJECT 60 UNITS SUBCUTANEOUSLY ONCE DAILY., Disp: 3 pen, Rfl: 2 .  Vitamin D, Ergocalciferol, (DRISDOL) 1.25 MG (50000 UT) CAPS capsule, Take 1 capsule (50,000 Units total) by mouth once a week., Disp: 4 capsule, Rfl: 3 .  XOPENEX HFA 45 MCG/ACT inhaler, INHALE 2 PUFFS EVERY 6 HOURS IF NEEDED FOR WHEEZING OR SHORTNESS OF BREATH., Disp: 15 g, Rfl: 0

## 2019-05-08 ENCOUNTER — Ambulatory Visit: Payer: Medicare Other | Admitting: Endocrinology

## 2019-05-19 ENCOUNTER — Telehealth: Payer: Self-pay | Admitting: Internal Medicine

## 2019-05-19 NOTE — Telephone Encounter (Signed)
LVM for patient to advise she should have discussion of vaccine during her visit with Dr. Annamaria Boots as it would not be recommended to get a vaccination if still sick with bronchitis. Advised to call back if there are any additional questions. Nothing further needed at this time.

## 2019-06-03 ENCOUNTER — Ambulatory Visit (INDEPENDENT_AMBULATORY_CARE_PROVIDER_SITE_OTHER): Payer: Medicare Other

## 2019-06-03 ENCOUNTER — Ambulatory Visit (INDEPENDENT_AMBULATORY_CARE_PROVIDER_SITE_OTHER): Payer: Medicare Other | Admitting: Internal Medicine

## 2019-06-03 ENCOUNTER — Other Ambulatory Visit: Payer: Self-pay

## 2019-06-03 ENCOUNTER — Encounter: Payer: Self-pay | Admitting: Internal Medicine

## 2019-06-03 VITALS — BP 122/82 | HR 72 | Temp 97.1°F | Ht 63.0 in | Wt 179.0 lb

## 2019-06-03 DIAGNOSIS — J449 Chronic obstructive pulmonary disease, unspecified: Secondary | ICD-10-CM

## 2019-06-03 DIAGNOSIS — J9611 Chronic respiratory failure with hypoxia: Secondary | ICD-10-CM

## 2019-06-03 NOTE — Patient Instructions (Signed)
Order- CXR   Dx COPD mixed type  Please call if we can help

## 2019-06-03 NOTE — Assessment & Plan Note (Signed)
Benefits from oxygen and will continue.

## 2019-06-03 NOTE — Assessment & Plan Note (Addendum)
Chronic bronchitis waxes and wanes. Holding off an antibiotics unless she gets worse. Advised her to find and resume using her Flutter device. Has not tolerated albuterol stimulation from MDIs. Plan- CXR

## 2019-06-03 NOTE — Progress Notes (Signed)
Patient ID: [name], female    DOB: 19-Oct-1935, 84 y.o.   MRN: [id number]  HPI Female former smoker followed for COPD, chronic hypoxic respiratory failure, complicated by chronic rhinitis, GERD, DM, glaucoma 2012- Walk test Patient Saturations on Room Air while Ambulating = 84% Patient Saturations on 2.5 Liters of oxygen while Ambulating = 92%  PFT: 01/16/2012 severe obstructive airways disease with insignificant response to bronchodilator, air trapping, diffusion moderately reduced. FEV1 0.82/45%, FEV1/FEC 0.46. Emphysema pattern on the loop. TLC 100%, RV 148%, DLCO 47%. ---------------------------------------------------------------------   05/31/2018- 84 year old female former smoker followed for COPD, chronic hypoxic respiratory failure, complicated by chronic rhinitis, GERD, DM2,  back pain/scoliosis O2 3-3.5L/ Advanced Xopenex HFA, Stiolto Respimat, -----Cough: Pt states she has had cough x years but feels cough has gotten worse and nothing is helping. Prednisone works well, but she is diabetic. Labs 11/06/2017-eos 0.4 She will go for several days without cough and then wake at night suddenly coughing.  I told her this sounded like reflux which we have explored before.  She continues daily Prilosec.  Denies problems with swallowing.  Has degenerative disc disease and scoliosis which she thinks contributed.  We discussed resuming trial of a steroid inhaler since she recognizes prednisone helps. CXR 05/17/2017- Lungs are adequately inflated without focal consolidation or effusion. Cardiomediastinal silhouette is within normal. There is calcified plaque over the thoracic aorta. Mild degenerate change of the spine. No active cardiopulmonary disease.  06/03/19- 84 year old female former smoker followed for COPD, chronic hypoxic respiratory failure, complicated by chronic rhinitis, GERD, DM2,  back pain/scoliosis O2 3-3.5 L/ [employer] Generally intolerant of Beta adrenergic  stimulants Xopenex HFA, Stiolto 2.5 Respimat, neb Duoneb, Astelin,  Here with aide- no longer drives. Chronic bronchitis- variable cough and green sputum " comes and goes", Discussed her medication intolerance list and will wait on abx. Chronic bach ache x years- worse after a door swung against her back and knocked her down. Had xrays. CXR 05/31/2018 No active cardiopulmonary disease.  ROS-see HPI  + = positive Constitutional:   No-   weight loss, night sweats, fevers, chills, fatigue, lassitude. HEENT:   No-  headaches, difficulty swallowing, tooth/dental problems, sore throat,       No-  sneezing, itching, ear ache, nasal congestion, post nasal drip,  CV:  No-   chest pain, orthopnea, PND, swelling in lower extremities, anasarca,                                                   dizziness, palpitations Resp: +shortness of breath with exertion or at rest.              + productive cough,  + non-productive cough,  No- coughing up of blood.              +   change in color of mucus.   wheezing.   Skin: No-   rash or lesions. GI:  +heartburn, indigestion, No-abdominal pain, nausea, vomiting,  GU: . MS:  No-   joint pain or swelling.   + back pain Neuro-     nothing unusual Psych:  No- change in mood or affect. +depression or anxiety.  No memory loss.    Objective:  OBJ- Physical Exam    + Very talkative General- Alert, Oriented, Affect-appropriate, Distress- none acute, + overweight, +O2  Skin- rash-none, lesions- none, excoriation- none Lymphadenopathy- none Head- atraumatic            Eyes- Gross vision intact, PERRLA, conjunctivae and secretions clear            Ears- Hearing, canals-normal            Nose- Clear, no-Septal dev, mucus, polyps, erosion, perforation             Throat- Mallampati IV , mucosa -no thrush , drainage- none, tonsils- atrophic,                                      stridor-none,  + missing teeth Neck- flexible , trachea midline, no stridor , thyroid nl,  carotid no bruit Chest - symmetrical excursion , unlabored           Heart/CV- RRR , no murmur , no gallop  , no rub, nl s1 s2                           - JVD- none , edema- none, stasis changes- none, varices- none           Lung-  + few crackles L base, unlabored, wheeze-none, cough- none while here , dullness-none, rub- none           Chest wall-  Abd-  Br/ Gen/ Rectal- Not done, not indicated Extrem- cyanosis- none, clubbing, none, atrophy- none, strength- , + wheelchair Neuro- grossly intact to observation

## 2019-06-04 ENCOUNTER — Other Ambulatory Visit: Payer: Self-pay | Admitting: Internal Medicine

## 2019-06-04 ENCOUNTER — Telehealth: Payer: Self-pay | Admitting: Endocrinology

## 2019-06-04 ENCOUNTER — Other Ambulatory Visit: Payer: Self-pay

## 2019-06-04 ENCOUNTER — Other Ambulatory Visit: Payer: Self-pay | Admitting: Endocrinology

## 2019-06-04 ENCOUNTER — Encounter: Payer: Self-pay | Admitting: Endocrinology

## 2019-06-04 ENCOUNTER — Ambulatory Visit (INDEPENDENT_AMBULATORY_CARE_PROVIDER_SITE_OTHER): Payer: Medicare Other | Admitting: Endocrinology

## 2019-06-04 VITALS — BP 150/68 | HR 120 | Ht 63.0 in | Wt 179.8 lb

## 2019-06-04 DIAGNOSIS — E782 Mixed hyperlipidemia: Secondary | ICD-10-CM

## 2019-06-04 DIAGNOSIS — Z794 Long term (current) use of insulin: Secondary | ICD-10-CM | POA: Diagnosis not present

## 2019-06-04 DIAGNOSIS — E1142 Type 2 diabetes mellitus with diabetic polyneuropathy: Secondary | ICD-10-CM

## 2019-06-04 DIAGNOSIS — E1165 Type 2 diabetes mellitus with hyperglycemia: Secondary | ICD-10-CM

## 2019-06-04 DIAGNOSIS — R634 Abnormal weight loss: Secondary | ICD-10-CM | POA: Diagnosis not present

## 2019-06-04 DIAGNOSIS — I10 Essential (primary) hypertension: Secondary | ICD-10-CM | POA: Diagnosis not present

## 2019-06-04 LAB — POCT GLYCOSYLATED HEMOGLOBIN (HGB A1C): Hemoglobin A1C: 10.3 % — AB (ref 4.0–5.6)

## 2019-06-04 MED ORDER — IRBESARTAN 75 MG PO TABS: 75.0000 mg | ORAL_TABLET | Freq: Every day | ORAL | 1 refills | Status: DC

## 2019-06-04 NOTE — Progress Notes (Signed)
Patient ID: [name], female   DOB: January 31, 1936, 84 y.o.   MRN: [id number]    Reason for Appointment: Diabetes and follow-up of multiple problems  History of Present Illness    PROBLEM 1:  DIABETES type II since 1980   She has been on insulin using Lantus and Humalog for several years and usually A1c around 8% despite fairly good compliance with diet, exercise and glucose monitoring. She also had been on Actos previously which probably benefited her especially with her fatty liver but she stopped it because of fear of side effects   She was changed from NovoLog to U-500 insulin starting in 02/2016; she did not related was controlling her sugars and preferred to go back to NovoLog.    Recent history:   Insulin regimen: Tresiba  60 units in a.m.  NOVOLOG before meals 12-14 units before breakfast and 12-14 at dinnertime  Her A1c has been previously consistently around 9- 10% and last 10.6   She has not been checking her blood sugars as much and mostly in the mornings  Also not clear which readings in the mornings are before or after eating, has had a couple of readings of 65 and 55 between 930-10 AM  Although she again says that she is eating small portions the few readings she has late at night are all fairly high  Occasionally may eat during the night but only a snack like peanut butter crackers  This does not explain why her morning sugars are fluctuating significantly  She has cut back on her NovoLog at suppertime compared to previously since she does not think she is eating at night  As before her meals are variable and not always planning ahead of time  Not able to do any exercise  Her weight is about the same although about 6 pounds less than on her last visit in 9/20  She was told to try [place] on her last visit but she did not continue because she thought it was causing urinary infection   Oral hypoglycemic drugs: none       Side effects from  medications: None         Proper timing of medications in relation to meals: Yes.          Monitors blood glucose:  Up to 4 times a day   Glucometer:  Accu-Chek          Blood Glucose readings   PRE-MEAL Fasting Lunch Dinner Bedtime Overall  Glucose range:  55- 259  107-234  81-303  376   Mean/median:  163  167    187   POST-MEAL PC Breakfast PC Lunch  overnight  Glucose range: ?  137, 155   177-350  Mean/median:      PREVIOUS readings:  PRE-MEAL  morning Lunch  7-9 PM  overnight Overall  Glucose range:  88-271   171-389  167-380  71-454  Mean/median:  185  263  277   221     Meals: 3 meals per day.  Breakfast 7 AM ; lunch 1-2 pm Supper 6 to 7 PM     Physical activity: exercise: Unable to do any because of dyspnea and fatigue            Dietician visit: Most recent: Several years ago            Complications: are: Peripheral neuropathy with sensory loss     Wt Readings from Last 3 Encounters:  06/04/19 179  lb 12.8 oz (81.6 kg)  06/03/19 179 lb (81.2 kg)  02/13/19 184 lb 9.6 oz (83.7 kg)    Lab Results  Component Value Date   HGBA1C 10.3 (A) 06/04/2019   HGBA1C 10.6 (A) 12/09/2018   HGBA1C 8.8 (A) 05/09/2018   Lab Results  Component Value Date   MICROALBUR 1.1 05/11/2017   LDLCALC 60 12/09/2018   CREATININE 0.68 01/17/2019     OTHER Current problems  are detailed in review of systems   Allergies as of 06/04/2019      Reactions   Chlordiazepoxide-clidinium Other (See Comments)   Sleepy,weak   Biaxin [clarithromycin] Other (See Comments)   Foul taste, abd pain, diarrhea   Tramadol Other (See Comments)   Caused tremors   Codeine Other (See Comments)   REACTION: gi upset- only in high doses per pt.  Can tolerate cough syrup with codeine.       Medication List       Accurate as of June 04, 2019  1:54 PM. If you have any questions, ask your nurse or doctor.        STOP taking these medications   Dexcom G6 Receiver Devi Stopped by: [name],  MD   Dexcom G6 Sensor Misc Stopped by: [name], MD   Dexcom G6 Transmitter Misc Stopped by: [name], MD   Farxiga 5 MG Tabs tablet Generic drug: dapagliflozin propanediol Stopped by: [name], MD     TAKE these medications   Accu-Chek Softclix Lancet Dev Kit 1 each by Does not apply route daily. Use as instructed to check blood sugar 8 times daily.   Accu-Chek Softclix Lancets lancets 1 each by Other route daily. Use as instructed to check blood sugar 8 times daily.   AeroChamber MV inhaler Use as instructed   ALPRAZolam 0.5 MG tablet Commonly known as: XANAX TAKE 1/2 TO 1 TABLET TWICE DAILY AS NEEDED FOR ANXIETY.   aspirin 325 MG tablet Take 325 mg by mouth daily.   azelastine 0.1 % nasal spray Commonly known as: ASTELIN Place 1 spray into both nostrils 2 (two) times daily. Use in each nostril as directed   cholestyramine 4 g packet Commonly known as: Questran 1 scoop/pack twice a day as needed.   dicyclomine 10 MG capsule Commonly known as: BENTYL TAKE (1) CAPSULE TWICE DAILY.   glucose blood test strip Commonly known as: Accu-Chek Aviva Plus USE AS INSTRUCTED TO CHECK BLOOD SUGARS 8 TIMES PER DAY. DX:E11.65   Insulin Pen Needle 31G X 8 MM Misc Commonly known as: B-D ULTRAFINE III SHORT PEN TEST 3 TIMES A DAY   B-D UF III MINI PEN NEEDLES 31G X 5 MM Misc Generic drug: Insulin Pen Needle USE 5 PER DAY TO INJECT INSULIN   Insulin Syringe-Needle U-100 31G X 15/64" 0.5 ML Misc Commonly known as: BD Insulin Syringe Ultrafine Use 2 per day dx code E11.65   ipratropium-albuterol 0.5-2.5 (3) MG/3ML Soln Commonly known as: DUONEB 1 vial in neb every 8 hours and as needed   magic mouthwash Soln Swish with 2 teaspoonfuls by mouth as needed What changed:   when to take this  reasons to take this   meclizine 25 MG tablet Commonly known as: ANTIVERT take 1 tablet by mouth three times a day if needed for dizziness/vertigo What changed:   when to  take this  reasons to take this   methenamine 0.5 GM tablet Commonly known as: MANDELAMINE   metoCLOPramide 5 MG tablet Commonly known as:  REGLAN TAKE  (1)  TABLET  FOUR TIMES DAILY.   metoprolol succinate 25 MG 24 hr tablet Commonly known as: TOPROL-XL TAKE 1 TABLET ONCE DAILY.   mupirocin ointment 2 % Commonly known as: BACTROBAN Apply 1 application topically 2 (two) times daily. Apply to the affected area 2 times a day   NovoLOG FlexPen 100 UNIT/ML FlexPen Generic drug: insulin aspart INJECT 12-16 UNITS UNDER THE SKIN 3 TIMES DAILY. What changed: additional instructions Changed by: [name], MD   omeprazole 20 MG capsule Commonly known as: PRILOSEC TAKE (1) CAPSULE DAILY.   OXYGEN Inhale 3 L into the lungs continuous.   pravastatin 20 MG tablet Commonly known as: PRAVACHOL TAKE 1 TABLET ONCE DAILY.   promethazine-codeine 6.25-10 MG/5ML syrup Commonly known as: PHENERGAN with CODEINE Take 5 mLs by mouth every 6 (six) hours as needed for cough.   sodium chloride HYPERTONIC 3 % nebulizer solution 1 ampule neb every 6 hours if needed to clear mucus   Stiolto Respimat 2.5-2.5 MCG/ACT Aers Generic drug: Tiotropium Bromide-Olodaterol USE 2 PUFFS DAILY   Tresiba FlexTouch 200 UNIT/ML Sopn Generic drug: Insulin Degludec INJECT 60 UNITS SUBCUTANEOUSLY ONCE DAILY.   Vitamin D (Ergocalciferol) 1.25 MG (50000 UNIT) Caps capsule Commonly known as: DRISDOL Take 1 capsule (50,000 Units total) by mouth once a week.   Xopenex HFA 45 MCG/ACT inhaler Generic drug: levalbuterol INHALE 2 PUFFS EVERY 6 HOURS IF NEEDED FOR WHEEZING OR SHORTNESS OF BREATH.       Allergies:  Allergies  Allergen Reactions  . Chlordiazepoxide-Clidinium Other (See Comments)    Sleepy,weak  . Biaxin [Clarithromycin] Other (See Comments)    Foul taste, abd pain, diarrhea  . Tramadol Other (See Comments)    Caused tremors  . Codeine Other (See Comments)    REACTION: gi upset- only in high  doses per pt.  Can tolerate cough syrup with codeine.     Past Medical History:  Diagnosis Date  . Allergic rhinitis, cause unspecified    Sinus CT Rec 12-23-2009  . Arthritis   . Benign paroxysmal positional vertigo 08/09/2012  . 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 Complication of anesthesia    takes along time to wake up  . Diabetes mellitus   . Diarrhea   . Diverticulosis   . Esophageal reflux   . Esophageal stricture   . GERD (gastroesophageal reflux disease)   . Glaucoma   . Hiatal hernia   . Hypertension   . Irritable bowel syndrome   . Neuropathy in diabetes ([place]) 08/09/2012  . Other chronic nonalcoholic liver disease   . Oxygen deficiency   . Sciatica   . Scoliosis   . Shingles 2010  . Shortness of breath   . Steatohepatitis     Past Surgical History:  Procedure Laterality Date  . APPENDECTOMY    . CARPAL TUNNEL RELEASE     right hand  . CHOLECYSTECTOMY OPEN  1978  . COLONOSCOPY  07-2001   mild diverticulosis  . ESOPHAGOGASTRODUODENOSCOPY  [id number]   H Hernia,es.stricture s/p dil 71F  . INTRAOCULAR LENS INSERTION Bilateral   . LIVER BIOPSY  [phone]  . PARATHYROID EXPLORATION    . TONSILLECTOMY AND ADENOIDECTOMY    . TOTAL ABDOMINAL HYSTERECTOMY    . ULNAR NERVE TRANSPOSITION  12/07/2011   Procedure: ULNAR NERVE DECOMPRESSION/TRANSPOSITION;this was cancelled-not done  Surgeon: Cammie Sickle., MD;  Location: [facility];  Service: Orthopedics;  Laterality: Right;  right ulnar nerve in situ decompression  . ULNAR TUNNEL RELEASE  03/07/2012   Procedure: CUBITAL TUNNEL RELEASE;  Surgeon: Roseanne Kaufman, MD;  Location: [facility];  Service: Orthopedics;  Laterality: Right;  ulnar nerve release at the elbow      Family History  Problem Relation Age of Onset  . Heart disease Father   . Lung cancer Mother        small cell;Byssinosis  . Lung cancer Sister   . Liver cancer  Sister        ? mets from another area of the body  . Diabetes Other        grandmother  . Stroke Maternal Grandfather     Social History:  reports that she quit smoking about 31 years ago. Her smoking use included cigarettes. She has never used smokeless tobacco. She reports that she does not drink alcohol or use drugs.  Review of Systems:  DIARRHEA: She has had chronic diarrhea treated with Questran or Colestid, reportedly from irritable bowel Has been to several gastroenterologists Diarrhea has been only at times and not on regular treatment now   HYPERTENSION:  She was on 75 mg of irbesartan However not clear why this is not on her list Has not checked blood pressure at home  BP Readings from Last 3 Encounters:  06/04/19 (!) 150/68  06/03/19 122/82  01/30/19 126/74    She takes Xanax at night as needed for sleep and lorazepam as needed for anxiety  HYPERLIPIDEMIA:  She has had mixed hyperlipidemia, LDL treated with pravastatin 20 mg Last LDL adequately controlled   Lab Results  Component Value Date   CHOL 145 12/09/2018   HDL 60.00 12/09/2018   LDLCALC 60 12/09/2018   LDLDIRECT 89.0 10/14/2014   TRIG 125.0 12/09/2018   CHOLHDL 2 12/09/2018    She is followed by pulmonologist for chronic COPD, using home oxygen Also has recurrent cough including recently  She has numerous musculoskeletal issues  She has had persistent joint pains and back pain   Vitamin D level previously low and is being supplemented   Lab Results  Component Value Date   VD25OH 38.42 12/09/2018   VD25OH 19.95 (L) 02/06/2018      Examination:   BP (!) 150/68 (BP Location: Left Arm, Patient Position: Sitting, Cuff Size: Normal)   Pulse (!) 120   Ht 5' 3" (1.6 m)   Wt 179 lb 12.8 oz (81.6 kg)   SpO2 93%   BMI 31.85 kg/m   Body mass index is 31.85 kg/m.     ASSESSMENT/ PLAN:    Diabetes type 2 with obesity, insulin-dependent  See history of present illness for detailed  discussion of  current management, blood sugar patterns and problems identified  Her A1c is again over 10%  She is likely having high postprandial readings which she does not monitor Also unclear whether some of the high readings at night occurring over the next morning and some days especially with snacks at night Discussed frequency of blood sugar monitoring and does not need to check readings before breakfast every day She will go up to at least 18 units of NovoLog at suppertime regardless of her meal size and adjust further only if blood sugars are out of range  HYPERTENSION: Has been previously prescribed 75 mg of Avapro Blood pressure is relatively high today and will need to go back on this  History of vitamin D deficiency, will need follow-up labs again on the  next visit  LIPIDS: Adequately controlled as of 8/20  She is refusing to consider Covid vaccine but stressed importance of doing so because of her age and high risk status and reassured her about the safety.  Given information on where to schedule or be on a waiting list   Patient Instructions  Novolog 18 units at supper and adjust to keep reading <180 at bedtime  More sugars after meals   You may register to get the Covid vaccine at either of the 2 options:  [place] website   [email or web address]  Telephone number: [phone], Option 2   Also can register at the [place] website, wait listing may be an option  [email or web address] or call [phone]       [name] 06/04/2019, 1:54 PM

## 2019-06-04 NOTE — Telephone Encounter (Signed)
Patient returning your phone call.

## 2019-06-04 NOTE — Telephone Encounter (Signed)
Pt returned phone call and she was given MD message below. Pt verbalized understanding that she would be starting back on Irbesartan 23m tablets daily. Rx was sent.

## 2019-06-04 NOTE — Patient Instructions (Addendum)
Novolog 18 units at supper and adjust to keep reading <180 at bedtime  More sugars after meals   You may register to get the Covid vaccine at either of the 2 options:  [place] website   [email or web address]  Telephone number: [phone], Option 2   Also can register at the [place] website, wait listing may be an option  [email or web address] or call [phone]

## 2019-06-04 NOTE — Telephone Encounter (Signed)
Called pt and left voicemail requesting a call back.

## 2019-06-04 NOTE — Telephone Encounter (Signed)
She does not appear to be getting her irbesartan blood pressure pill.  Since blood pressure is higher today and this will help protect her kidneys need to start 75 mg daily, please call

## 2019-06-06 ENCOUNTER — Telehealth: Payer: Self-pay | Admitting: Internal Medicine

## 2019-06-06 NOTE — Telephone Encounter (Signed)
Called and spoke with pt letting her know that she would be fine to take her stiolto tonight, 1/29 as soon as she received her inhaler and stated to her to wait until mid morning tomorrow, 1/30 to take it. Stated to pt after tomorrow, 1/30 she should be able to get back on regular schedule taking her inhaler like she usually takes it and pt verbalized understanding. Nothing further needed.

## 2019-06-09 ENCOUNTER — Telehealth: Payer: Self-pay

## 2019-06-09 NOTE — Telephone Encounter (Signed)
Called pt and informed her that she can go to the lab at [facility] and have her blood work completed, or she can set up an appt to come into this office and have it done. Pt elected to go to [facility] and have it done.

## 2019-06-09 NOTE — Telephone Encounter (Signed)
Patient did not get labs drawn on 06/04/19 when she was in the office-she needs to know what to do now-please contact patient with advice

## 2019-06-23 ENCOUNTER — Telehealth: Payer: Self-pay | Admitting: Internal Medicine

## 2019-06-23 ENCOUNTER — Other Ambulatory Visit: Payer: Self-pay | Admitting: Endocrinology

## 2019-06-23 MED ORDER — ALBUTEROL SULFATE HFA 108 (90 BASE) MCG/ACT IN AERS: 2.0000 | INHALATION_SPRAY | Freq: Four times a day (QID) | RESPIRATORY_TRACT | 12 refills | Status: DC | PRN

## 2019-06-23 MED ORDER — DOXYCYCLINE HYCLATE 100 MG PO TABS: 100.0000 mg | ORAL_TABLET | Freq: Two times a day (BID) | ORAL | 0 refills | Status: DC

## 2019-06-23 NOTE — Telephone Encounter (Signed)
Ok to DC [employer] and change to albuterol hfa inhaler, # 1, 2 puffs every 4-6 hours as needed, ref x 12  Offer doxycycline 100 mg, # 8, 2 today then one daily

## 2019-06-23 NOTE — Telephone Encounter (Signed)
Called and spoke with pt letting her know the info stated by CY and she verbalized understanding. Discontinued the xopenex inhaler and sent rx for albuterol inhaler as well as doxycycline to preferred pharmacy for pt. Nothing further needed.

## 2019-06-23 NOTE — Telephone Encounter (Signed)
Called and spoke to pt. Pt c/o increase in SOB, worsening prod cough with yellow mucus, and chest tightness. Pt was seen on 06/03/19 by Dr. Annamaria Boots, pt states a few days after the visit she began to exhibiting s/s. Pt denies f/c/s. Pt states she also tried to get her Xopenex refilled but insurance isnt covering it. Pt states a long time ago she use to take albuterol but not sure why she stopped.   Dr. Annamaria Boots please advise on recs for pt's s/s and if ok to send in albuterol in lieu of xopenex. Thanks.    Allergies  Allergen Reactions  . Chlordiazepoxide-Clidinium Other (See Comments)    Sleepy,weak  . Biaxin [Clarithromycin] Other (See Comments)    Foul taste, abd pain, diarrhea  . Tramadol Other (See Comments)    Caused tremors  . Codeine Other (See Comments)    REACTION: gi upset- only in high doses per pt.  Can tolerate cough syrup with codeine.     Current Outpatient Medications on File Prior to Visit  Medication Sig Dispense Refill  . ACCU-CHEK SOFTCLIX LANCETS lancets 1 each by Other route daily. Use as instructed to check blood sugar 8 times daily. 250 each 3  . ALPRAZolam (XANAX) 0.5 MG tablet TAKE 1/2 TO 1 TABLET TWICE DAILY AS NEEDED FOR ANXIETY. 60 tablet 0  . aspirin 325 MG tablet Take 325 mg by mouth daily.      Marland Kitchen azelastine (ASTELIN) 0.1 % nasal spray Place 1 spray into both nostrils 2 (two) times daily. Use in each nostril as directed 30 mL 1  . B-D UF III MINI PEN NEEDLES 31G X 5 MM MISC USE 5 PER DAY TO INJECT INSULIN 200 each 3  . cholestyramine (QUESTRAN) 4 g packet 1 scoop/pack twice a day as needed. 60 each 3  . dicyclomine (BENTYL) 10 MG capsule TAKE (1) CAPSULE TWICE DAILY. 60 capsule 0  . glucose blood (ACCU-CHEK AVIVA PLUS) test strip USE AS INSTRUCTED TO CHECK BLOOD SUGARS 8 TIMES PER DAY. DX:E11.65 250 each 2  . insulin aspart (NOVOLOG FLEXPEN) 100 UNIT/ML FlexPen INJECT 12-16 UNITS UNDER THE SKIN 3 TIMES DAILY. 15 mL 0  . Insulin Pen Needle (B-D ULTRAFINE III SHORT  PEN) 31G X 8 MM MISC TEST 3 TIMES A DAY 100 each 5  . Insulin Syringe-Needle U-100 (BD INSULIN SYRINGE ULTRAFINE) 31G X 15/64" 0.5 ML MISC Use 2 per day dx code E11.65 100 each 5  . ipratropium-albuterol (DUONEB) 0.5-2.5 (3) MG/3ML SOLN 1 vial in neb every 8 hours and as needed 90 mL 11  . irbesartan (AVAPRO) 75 MG tablet Take 1 tablet (75 mg total) by mouth daily. 30 tablet 1  . Lancets Misc. (ACCU-CHEK SOFTCLIX LANCET DEV) KIT 1 each by Does not apply route daily. Use as instructed to check blood sugar 8 times daily. 1 kit 0  . magic mouthwash SOLN Swish with 2 teaspoonfuls by mouth as needed (Patient taking differently: daily as needed. Swish with 2 teaspoonfuls by mouth as needed) 240 mL 3  . meclizine (ANTIVERT) 25 MG tablet take 1 tablet by mouth three times a day if needed for dizziness/vertigo (Patient taking differently: daily as needed. take 1 tablet by mouth three times a day if needed for dizziness/vertigo) 30 tablet 3  . methenamine (MANDELAMINE) 0.5 GM tablet     . metoCLOPramide (REGLAN) 5 MG tablet TAKE  (1)  TABLET  FOUR TIMES DAILY. 90 tablet 0  . metoprolol succinate (TOPROL-XL) 25 MG 24  hr tablet TAKE 1 TABLET ONCE DAILY. 90 tablet 0  . mupirocin ointment (BACTROBAN) 2 % Apply 1 application topically 2 (two) times daily. Apply to the affected area 2 times a day 22 g 3  . omeprazole (PRILOSEC) 20 MG capsule TAKE (1) CAPSULE DAILY. 90 capsule 0  . OXYGEN Inhale 3 L into the lungs continuous.     . pravastatin (PRAVACHOL) 20 MG tablet TAKE 1 TABLET ONCE DAILY. 90 tablet 3  . promethazine-codeine (PHENERGAN WITH CODEINE) 6.25-10 MG/5ML syrup Take 5 mLs by mouth every 6 (six) hours as needed for cough. 180 mL 0  . sodium chloride HYPERTONIC 3 % nebulizer solution 1 ampule neb every 6 hours if needed to clear mucus 75 mL 12  . Spacer/Aero-Holding Chambers (AEROCHAMBER MV) inhaler Use as instructed 1 each 0  . STIOLTO RESPIMAT 2.5-2.5 MCG/ACT AERS USE 2 PUFFS DAILY 4 g 3  . TRESIBA  FLEXTOUCH 200 UNIT/ML SOPN INJECT 60 UNITS SUBCUTANEOUSLY ONCE DAILY. 3 pen 2  . Vitamin D, Ergocalciferol, (DRISDOL) 1.25 MG (50000 UT) CAPS capsule Take 1 capsule (50,000 Units total) by mouth once a week. 4 capsule 3  . XOPENEX HFA 45 MCG/ACT inhaler INHALE 2 PUFFS EVERY 6 HOURS IF NEEDED FOR WHEEZING OR SHORTNESS OF BREATH. 15 g 0   No current facility-administered medications on file prior to visit.

## 2019-07-04 ENCOUNTER — Telehealth: Payer: Self-pay | Admitting: Endocrinology

## 2019-07-04 ENCOUNTER — Telehealth: Payer: Self-pay | Admitting: Internal Medicine

## 2019-07-04 DIAGNOSIS — R059 Cough, unspecified: Secondary | ICD-10-CM

## 2019-07-04 DIAGNOSIS — R05 Cough: Secondary | ICD-10-CM

## 2019-07-04 MED ORDER — AMOXICILLIN-POT CLAVULANATE 500-125 MG PO TABS: 1.0000 | ORAL_TABLET | Freq: Two times a day (BID) | ORAL | 0 refills | Status: DC

## 2019-07-04 NOTE — Telephone Encounter (Signed)
Please contact patient and inform her that she should get her vaccine. Dr. Dwyane Dee wants all of his patients to have this.

## 2019-07-04 NOTE — Telephone Encounter (Signed)
Spoke with the pt  She states her cough is not improving- still coughing up yellow sputum  She states just recently completed doxy we called in for her  She feels like it's coming from her sinuses  She is having a lot of sneezing and states PND  She wonders about "sinus doctor" She denies f/c/s, body aches, SOB, wheezing  Please advise, thanks

## 2019-07-04 NOTE — Telephone Encounter (Signed)
Patient notified

## 2019-07-04 NOTE — Telephone Encounter (Signed)
Suggest we offer augmentin 500 mg, # 20, 1 twice daily  Suggest she also use a saline sinus rinse.   If these don't help, then we can refer her to ENT, but right now, this is what they would try.

## 2019-07-04 NOTE — Telephone Encounter (Signed)
Called the patient and made her aware of the response received from Dr. Annamaria Boots. Patient advised to call our office if her symptoms do not improve after she has completed the antibiotic sent to the pharmacy.  Patient stated she has an OTC cough syrup and will use that as well to see if it helps.  Patient voiced understanding. Nothing further needed at this time.

## 2019-07-04 NOTE — Telephone Encounter (Signed)
Patient requests to be called at ph# [phone] re: Patient has bronchitis and taking antibiotics. Patient's home where she lives is offering to administer by EMT's her 1st Covid 20 vaccine on July 16, 2019. Patient had been too sick with bronchitis or she would have gotten her vaccine sooner. Patient would like to make sure that Dr. Dwyane Dee thinks it is okay to get her Covid 19 vaccine on March 10th. If patient is not called and informed, patient will not get the Covid 19 vaccine.

## 2019-07-28 ENCOUNTER — Other Ambulatory Visit: Payer: Self-pay | Admitting: Endocrinology

## 2019-08-04 ENCOUNTER — Other Ambulatory Visit: Payer: Self-pay

## 2019-08-04 ENCOUNTER — Encounter: Payer: Self-pay | Admitting: Endocrinology

## 2019-08-04 ENCOUNTER — Ambulatory Visit (INDEPENDENT_AMBULATORY_CARE_PROVIDER_SITE_OTHER): Payer: Medicare Other | Admitting: Endocrinology

## 2019-08-04 VITALS — BP 144/76 | HR 122 | Ht 63.0 in | Wt 182.0 lb

## 2019-08-04 DIAGNOSIS — E1165 Type 2 diabetes mellitus with hyperglycemia: Secondary | ICD-10-CM

## 2019-08-04 DIAGNOSIS — E782 Mixed hyperlipidemia: Secondary | ICD-10-CM | POA: Diagnosis not present

## 2019-08-04 DIAGNOSIS — Z794 Long term (current) use of insulin: Secondary | ICD-10-CM

## 2019-08-04 DIAGNOSIS — I10 Essential (primary) hypertension: Secondary | ICD-10-CM

## 2019-08-04 DIAGNOSIS — G5601 Carpal tunnel syndrome, right upper limb: Secondary | ICD-10-CM

## 2019-08-04 LAB — LIPID PANEL
Cholesterol: 160 mg/dL (ref 0–200)
HDL: 41.7 mg/dL (ref >39.00)
LDL Cholesterol: 86 mg/dL (ref 0–99)
NonHDL: 118.54
Total CHOL/HDL Ratio: 4
Triglycerides: 164 mg/dL — ABNORMAL HIGH (ref 0.0–149.0)
VLDL: 32.8 mg/dL (ref 0.0–40.0)

## 2019-08-04 LAB — COMPREHENSIVE METABOLIC PANEL
ALT: 12 U/L (ref 0–35)
AST: 19 U/L (ref 0–37)
Albumin: 4 g/dL (ref 3.5–5.2)
Alkaline Phosphatase: 51 U/L (ref 39–117)
BUN: 11 mg/dL (ref 6–23)
CO2: 32 mEq/L (ref 19–32)
Calcium: 9.5 mg/dL (ref 8.4–10.5)
Chloride: 99 mEq/L (ref 96–112)
Creatinine, Ser: 0.66 mg/dL (ref 0.40–1.20)
GFR: 85.72 mL/min (ref >60.00)
Glucose, Bld: 77 mg/dL (ref 70–99)
Potassium: 4.3 mEq/L (ref 3.5–5.1)
Sodium: 138 mEq/L (ref 135–145)
Total Bilirubin: 0.3 mg/dL (ref 0.2–1.2)
Total Protein: 7.5 g/dL (ref 6.0–8.3)

## 2019-08-04 LAB — CBC WITH DIFFERENTIAL/PLATELET
Basophils Absolute: 0.1 10*3/uL (ref 0.0–0.1)
Basophils Relative: 0.6 % (ref 0.0–3.0)
Eosinophils Absolute: 0.3 10*3/uL (ref 0.0–0.7)
Eosinophils Relative: 4.1 % (ref 0.0–5.0)
HCT: 43.3 % (ref 36.0–46.0)
Hemoglobin: 13.7 g/dL (ref 12.0–15.0)
Lymphocytes Relative: 46.1 % — ABNORMAL HIGH (ref 12.0–46.0)
Lymphs Abs: 3.9 10*3/uL (ref 0.7–4.0)
MCHC: 31.7 g/dL (ref 30.0–36.0)
MCV: 83 fl (ref 78.0–100.0)
Monocytes Absolute: 0.4 10*3/uL (ref 0.1–1.0)
Monocytes Relative: 4.9 % (ref 3.0–12.0)
Neutro Abs: 3.7 10*3/uL (ref 1.4–7.7)
Neutrophils Relative %: 44.3 % (ref 43.0–77.0)
Platelets: 166 10*3/uL (ref 150.0–400.0)
RBC: 5.21 Mil/uL — ABNORMAL HIGH (ref 3.87–5.11)
RDW: 15.8 % — ABNORMAL HIGH (ref 11.5–15.5)
WBC: 8.4 10*3/uL (ref 4.0–10.5)

## 2019-08-04 LAB — TSH: TSH: 3.27 u[IU]/mL (ref 0.35–4.50)

## 2019-08-04 MED ORDER — IRBESARTAN 75 MG PO TABS: 75.0000 mg | ORAL_TABLET | Freq: Every day | ORAL | 1 refills | Status: DC

## 2019-08-04 NOTE — Progress Notes (Signed)
Patient ID: [name], female   DOB: May 06, 1936, 84 y.o.   MRN: [id number]    Reason for Appointment: Diabetes and follow-up of multiple problems  History of Present Illness    PROBLEM 1:  DIABETES type II since 1980   She has been on insulin using Lantus and Humalog for several years and usually A1c around 8% despite fairly good compliance with diet, exercise and glucose monitoring. She also had been on Actos previously which probably benefited her especially with her fatty liver but she stopped it because of fear of side effects   She was changed from NovoLog to U-500 insulin starting in 02/2016; she did not related was controlling her sugars and preferred to go back to NovoLog.    Recent history:   Insulin regimen: Tresiba  60 units in a.m.  NOVOLOG before meals 12-14 units before breakfast and 12-16 at dinnertime  Her A1c has been previously consistently around 9- 10% and last 10.3   She has not been increasing her NovoLog insulin despite having most readings over 200 in the evenings  She was told to take an average of 18 units at suppertime but is still taking mostly 12-14  Her average blood sugar overall is still the same  FASTING blood sugars fluctuate but appear to be better on an average compared to her last visit  Checking her blood sugars again mostly in the mornings and only some late evenings  She does not like to take much insulin because she thinks she is eating small portions  Hypoglycemia has been occasional in the mornings  Her weight is about the same   Oral hypoglycemic drugs: none       Side effects from medications: None         Proper timing of medications in relation to meals: Yes.          Monitors blood glucose:  Up to 4 times a day   Glucometer:  Accu-Chek          Blood Glucose readings   PRE-MEAL Fasting Lunch Dinner  overnight Overall  Glucose range:  54-211  155-215   234-306   Mean/median:  140  187   260  191    POST-MEAL PC Breakfast PC Lunch PC Dinner  Glucose range:    191-311  Mean/median:    260   PREVIOUS readings:  PRE-MEAL Fasting Lunch Dinner Bedtime Overall  Glucose range:  55- 259  107-234  81-303  376   Mean/median:  163  167    187   POST-MEAL PC Breakfast PC Lunch  overnight  Glucose range: ?  137, 155   177-350  Mean/median:        Meals: 3 meals per day.  Breakfast 7 AM ; lunch 1-2 pm Supper 6 to 7 PM     Physical activity: exercise: Unable to do any because of dyspnea and fatigue            Dietician visit: Most recent: Several years ago            Complications: are: Peripheral neuropathy with sensory loss     Wt Readings from Last 3 Encounters:  08/04/19 182 lb (82.6 kg)  06/04/19 179 lb 12.8 oz (81.6 kg)  06/03/19 179 lb (81.2 kg)    Lab Results  Component Value Date   HGBA1C 10.3 (A) 06/04/2019   HGBA1C 10.6 (A) 12/09/2018   HGBA1C 8.8 (A) 05/09/2018   Lab Results  Component  Value Date   MICROALBUR 1.1 05/11/2017   LDLCALC 60 12/09/2018   CREATININE 0.68 01/17/2019     OTHER Current problems  are detailed in review of systems   Allergies as of 08/04/2019      Reactions   Chlordiazepoxide-clidinium Other (See Comments)   Sleepy,weak   Biaxin [clarithromycin] Other (See Comments)   Foul taste, abd pain, diarrhea   Tramadol Other (See Comments)   Caused tremors   Codeine Other (See Comments)   REACTION: gi upset- only in high doses per pt.  Can tolerate cough syrup with codeine.       Medication List       Accurate as of August 04, 2019 10:36 AM. If you have any questions, ask your nurse or doctor.        STOP taking these medications   amoxicillin-clavulanate 500-125 MG tablet Commonly known as: Augmentin Stopped by: [name], MD   doxycycline 100 MG tablet Commonly known as: VIBRA-TABS Stopped by: [name], MD   irbesartan 75 MG tablet Commonly known as: AVAPRO Stopped by: [name], MD     TAKE these medications    Accu-Chek Softclix Lancet Dev Kit 1 each by Does not apply route daily. Use as instructed to check blood sugar 8 times daily.   Accu-Chek Softclix Lancets lancets 1 each by Other route daily. Use as instructed to check blood sugar 8 times daily.   AeroChamber MV inhaler Use as instructed   albuterol 108 (90 Base) MCG/ACT inhaler Commonly known as: VENTOLIN HFA Inhale 2 puffs into the lungs every 6 (six) hours as needed for wheezing or shortness of breath.   ALPRAZolam 0.5 MG tablet Commonly known as: XANAX TAKE 1/2 TO 1 TABLET TWICE DAILY AS NEEDED FOR ANXIETY.   aspirin 325 MG tablet Take 325 mg by mouth daily.   azelastine 0.1 % nasal spray Commonly known as: ASTELIN Place 1 spray into both nostrils 2 (two) times daily. Use in each nostril as directed   cholestyramine 4 g packet Commonly known as: Questran 1 scoop/pack twice a day as needed.   dicyclomine 10 MG capsule Commonly known as: BENTYL TAKE (1) CAPSULE TWICE DAILY.   glucose blood test strip Commonly known as: Accu-Chek Aviva Plus USE AS INSTRUCTED TO CHECK BLOOD SUGARS 8 TIMES PER DAY. DX:E11.65   Insulin Pen Needle 31G X 8 MM Misc Commonly known as: B-D ULTRAFINE III SHORT PEN TEST 3 TIMES A DAY   B-D UF III MINI PEN NEEDLES 31G X 5 MM Misc Generic drug: Insulin Pen Needle USE 5 PER DAY TO INJECT INSULIN   Insulin Syringe-Needle U-100 31G X 15/64" 0.5 ML Misc Commonly known as: BD Insulin Syringe Ultrafine Use 2 per day dx code E11.65   ipratropium-albuterol 0.5-2.5 (3) MG/3ML Soln Commonly known as: DUONEB 1 vial in neb every 8 hours and as needed   magic mouthwash Soln Swish with 2 teaspoonfuls by mouth as needed What changed:   when to take this  reasons to take this   meclizine 25 MG tablet Commonly known as: ANTIVERT take 1 tablet by mouth three times a day if needed for dizziness/vertigo What changed:   when to take this  reasons to take this   methenamine 0.5 GM tablet  Commonly known as: MANDELAMINE   metoCLOPramide 5 MG tablet Commonly known as: REGLAN TAKE  (1)  TABLET  FOUR TIMES DAILY.   metoprolol succinate 25 MG 24 hr tablet Commonly known as: TOPROL-XL TAKE 1 TABLET  ONCE DAILY.   mupirocin ointment 2 % Commonly known as: BACTROBAN Apply 1 application topically 2 (two) times daily. Apply to the affected area 2 times a day   NovoLOG FlexPen 100 UNIT/ML FlexPen Generic drug: insulin aspart INJECT 12-16 UNITS UNDER THE SKIN 3 TIMES DAILY.   omeprazole 20 MG capsule Commonly known as: PRILOSEC TAKE (1) CAPSULE DAILY.   OXYGEN Inhale 3 L into the lungs continuous.   pravastatin 20 MG tablet Commonly known as: PRAVACHOL TAKE 1 TABLET ONCE DAILY.   promethazine-codeine 6.25-10 MG/5ML syrup Commonly known as: PHENERGAN with CODEINE Take 5 mLs by mouth every 6 (six) hours as needed for cough.   sodium chloride HYPERTONIC 3 % nebulizer solution 1 ampule neb every 6 hours if needed to clear mucus   Stiolto Respimat 2.5-2.5 MCG/ACT Aers Generic drug: Tiotropium Bromide-Olodaterol USE 2 PUFFS DAILY   Tresiba FlexTouch 200 UNIT/ML FlexTouch Pen Generic drug: insulin degludec INJECT 60 UNITS SUBCUTANEOUSLY ONCE DAILY.   Vitamin D (Ergocalciferol) 1.25 MG (50000 UNIT) Caps capsule Commonly known as: DRISDOL Take 1 capsule (50,000 Units total) by mouth once a week.       Allergies:  Allergies  Allergen Reactions  . Chlordiazepoxide-Clidinium Other (See Comments)    Sleepy,weak  . Biaxin [Clarithromycin] Other (See Comments)    Foul taste, abd pain, diarrhea  . Tramadol Other (See Comments)    Caused tremors  . Codeine Other (See Comments)    REACTION: gi upset- only in high doses per pt.  Can tolerate cough syrup with codeine.     Past Medical History:  Diagnosis Date  . Allergic rhinitis, cause unspecified    Sinus CT Rec 12-23-2009  . Arthritis   . Benign paroxysmal positional vertigo 08/09/2012  . Chronic airway  obstruction, not elsewhere classified    HFA 75-90% after coaching 12-23-2009  . Complication of anesthesia    takes along time to wake up  . Diabetes mellitus   . Diarrhea   . Diverticulosis   . Esophageal reflux   . Esophageal stricture   . GERD (gastroesophageal reflux disease)   . Glaucoma   . Hiatal hernia   . Hypertension   . Irritable bowel syndrome   . Neuropathy in diabetes ([place]) 08/09/2012  . Other chronic nonalcoholic liver disease   . Oxygen deficiency   . Sciatica   . Scoliosis   . Shingles 2010  . Shortness of breath   . Steatohepatitis     Past Surgical History:  Procedure Laterality Date  . APPENDECTOMY    . CARPAL TUNNEL RELEASE     right hand  . CHOLECYSTECTOMY OPEN  1978  . COLONOSCOPY  07-2001   mild diverticulosis  . ESOPHAGOGASTRODUODENOSCOPY  1700,17-49   H Hernia,es.stricture s/p dil 24F  . INTRAOCULAR LENS INSERTION Bilateral   . LIVER BIOPSY  [phone]  . PARATHYROID EXPLORATION    . TONSILLECTOMY AND ADENOIDECTOMY    . TOTAL ABDOMINAL HYSTERECTOMY    . ULNAR NERVE TRANSPOSITION  12/07/2011   Procedure: ULNAR NERVE DECOMPRESSION/TRANSPOSITION;this was cancelled-not done  Surgeon: Cammie Sickle., MD;  Location: [place];  Service: Orthopedics;  Laterality: Right;  right ulnar nerve in situ decompression  . ULNAR TUNNEL RELEASE  03/07/2012   Procedure: CUBITAL TUNNEL RELEASE;  Surgeon: Roseanne Kaufman, MD;  Location: [place];  Service: Orthopedics;  Laterality: Right;  ulnar nerve release at the elbow      Family History  Problem Relation Age of Onset  .  Heart disease Father   . Lung cancer Mother        small cell;Byssinosis  . Lung cancer Sister   . Liver cancer Sister        ? mets from another area of the body  . Diabetes Other        grandmother  . Stroke Maternal Grandfather     Social History:  reports that she quit smoking about 31 years ago. Her smoking use included cigarettes. She has never  used smokeless tobacco. She reports that she does not drink alcohol or use drugs.  Review of Systems:  DIARRHEA: She has had chronic diarrhea treated with Questran or Colestid, reportedly from irritable bowel Has been to several gastroenterologists Diarrhea has been only at times and not on regular treatment now   HYPERTENSION:  She was restarted on 75 mg of irbesartan on her last visit which she thinks she is taking  Has not checked blood pressure at home  BP Readings from Last 3 Encounters:  08/04/19 (!) 144/76  06/04/19 (!) 150/68  06/03/19 122/82    She takes Xanax at night as needed for sleep and lorazepam as needed for anxiety  HYPERLIPIDEMIA:  She has had mixed hyperlipidemia, LDL treated with pravastatin 20 mg Last LDL adequately controlled   Lab Results  Component Value Date   CHOL 145 12/09/2018   HDL 60.00 12/09/2018   LDLCALC 60 12/09/2018   LDLDIRECT 89.0 10/14/2014   TRIG 125.0 12/09/2018   CHOLHDL 2 12/09/2018    She is followed by pulmonologist for chronic COPD, using home oxygen Also has recurrent cough, recently off antibiotics  BACK pain: She continues to have significant back pain and now complaining of pain in the upper back below her neck area   Vitamin D level previously low and better with supplements   Lab Results  Component Value Date   VD25OH 38.42 12/09/2018   VD25OH 19.95 (L) 02/06/2018   Occasionally has pain and tingling in her right hand at night    Examination:   BP (!) 144/76 (BP Location: Left Arm, Patient Position: Sitting, Cuff Size: Normal)   Pulse (!) 122   Ht 5' 3"  (1.6 m)   Wt 182 lb (82.6 kg)   SpO2 93%   BMI 32.24 kg/m   Body mass index is 32.24 kg/m.   Tinel's sign negative on the right  ASSESSMENT/ PLAN:    Diabetes type 2 with obesity, insulin-dependent  See history of present illness for detailed discussion of  current management, blood sugar patterns and problems identified  Her A1c is last over  10%  She continues to have significantly high postprandial readings especially in the evenings and late at night Her fasting blood sugars have been variable but as low as 50 for now Appears to be needing less basal insulin more mealtime insulin despite only small amount of carbohydrate intake at most meals As before she has not increased her mealtime doses and discussed that she is taking relatively small amounts compared to her basal insulin  She will cut down her basal insulin to 56. Take at least 18 units of NovoLog at dinnertime and increase lunchtime dose by 2 to 4 units also More blood sugars after lunch and dinner  HYPERTENSION: Has somewhat better blood pressure with 75 mg Avapro and will continue, tends to be anxious in the office   LIPIDS: Adequately controlled as of 8/20 and needs follow-up  Again reminded her to try and schedule for the  Covid vaccine and she will do so, reassured her that even if she has some residual cough she can take this safely   There are no Patient Instructions on file for this visit.    [name] 08/04/2019, 10:36 AM

## 2019-08-04 NOTE — Patient Instructions (Addendum)
TRESIBA 56 UNITS DAILY  NOVOLOG 16-18 AT LUNCH 18-20 AT SUPPER

## 2019-08-05 ENCOUNTER — Telehealth: Payer: Self-pay | Admitting: Internal Medicine

## 2019-08-05 ENCOUNTER — Ambulatory Visit (INDEPENDENT_AMBULATORY_CARE_PROVIDER_SITE_OTHER): Payer: Medicare Other | Admitting: Primary Care

## 2019-08-05 DIAGNOSIS — R05 Cough: Secondary | ICD-10-CM

## 2019-08-05 DIAGNOSIS — M25512 Pain in left shoulder: Secondary | ICD-10-CM

## 2019-08-05 DIAGNOSIS — R059 Cough, unspecified: Secondary | ICD-10-CM

## 2019-08-05 MED ORDER — PREDNISONE 10 MG PO TABS: ORAL_TABLET | ORAL | 0 refills | Status: DC

## 2019-08-05 NOTE — Progress Notes (Signed)
Virtual Visit via Telephone Note  I connected with [name] on 08/05/19 at 11:30 AM EDT by telephone and verified that I am speaking with the correct person using two identifiers.  Location: Patient: Home Provider: Office   I discussed the limitations, risks, security and privacy concerns of performing an evaluation and management service by telephone and the availability of in person appointments. I also discussed with the patient that there may be a patient responsible charge related to this service. The patient expressed understanding and agreed to proceed.  History of Present Illness: 84 year old female, former smoker.  Past medical history significant for severe COPD/ mixed type, chronic rhinitis, chronic respiratory failure with hypoxia, seasonal rhinitis, hypertension, GERD, type 2 diabetes, thrombocytopenia.  Patient of Dr. Annamaria Boots, last seen January 2021.  Stiolto Respimat as needed Xopenex HFA.   08/05/2019 Patient contacted today for acute televisit for back pain. Reports ache/pain across her upper back this past weekend. She woke up this morning with severe pain in her left shoulder. She states that pain went away after a few minutes. No significant change to her breathing. She has a chronic cough which is baseline and non-productive. Denies fever, HA, N/V/D, chest pain.   Observations/Objective:  - Able to speak in full sentences - No significant cough or wheezing   Assessment and Plan:  Pleurisy: - Likely d/t chronic cough. Denies chest pain or change in breathing.  - Rx prednisone 38m x 5 days and tylenol prn  - Orders: CXR   Left shoulder pain: - Patient has a hx of degenerative scoliosis  - Order: Left shoulder XR  Follow Up Instructions:   - Follow up as needed if symptoms do not improve  I discussed the assessment and treatment plan with the patient. The patient was provided an opportunity to ask questions and all were answered. The patient agreed with the  plan and demonstrated an understanding of the instructions.   The patient was advised to call back or seek an in-person evaluation if the symptoms worsen or if the condition fails to improve as anticipated.  I provided 18 minutes of non-face-to-face time during this encounter.   EMartyn Ehrich NP

## 2019-08-05 NOTE — Telephone Encounter (Signed)
Called and spoke to pt. Pt states she has recently had an increase in coughing spells and has had worsening in upper left sided back pain and thinks this is related to her lungs. Pt denies f/c/s and increase in SOB. Televisit appt scheduled with Derl Barrow, NP, this morning at 1130. Pt aware to seek emergency care if she has any worsening s/s.   Will forward to Derl Barrow, NP, as Juluis Rainier.

## 2019-08-05 NOTE — Patient Instructions (Signed)
Pleurisy - Rx prednisone 76m x 5 days and tylenol prn  - Orders: CXR   Left shoulder pain: - Patient has a hx of degenerative scoliosis  - Order: Left shoulder XR

## 2019-08-06 ENCOUNTER — Ambulatory Visit (INDEPENDENT_AMBULATORY_CARE_PROVIDER_SITE_OTHER)
Admission: RE | Admit: 2019-08-06 | Discharge: 2019-08-06 | Disposition: A | Discharge status: Home / Self Care | Payer: Medicare Other | Source: Ambulatory Visit | Attending: Primary Care | Admitting: Primary Care

## 2019-08-06 ENCOUNTER — Other Ambulatory Visit: Payer: Self-pay

## 2019-08-06 DIAGNOSIS — R05 Cough: Secondary | ICD-10-CM | POA: Diagnosis not present

## 2019-08-06 DIAGNOSIS — R059 Cough, unspecified: Secondary | ICD-10-CM

## 2019-08-06 DIAGNOSIS — M25512 Pain in left shoulder: Secondary | ICD-10-CM | POA: Diagnosis not present

## 2019-08-06 DIAGNOSIS — R0602 Shortness of breath: Secondary | ICD-10-CM | POA: Diagnosis not present

## 2019-08-06 NOTE — Progress Notes (Signed)
Please let patient know shoulder xray and CXR were normal. If continues to have back pain follow up with pcp

## 2019-08-07 ENCOUNTER — Other Ambulatory Visit: Payer: Self-pay

## 2019-08-07 ENCOUNTER — Other Ambulatory Visit: Payer: Self-pay | Admitting: Endocrinology

## 2019-08-07 ENCOUNTER — Telehealth: Payer: Self-pay | Admitting: Endocrinology

## 2019-08-07 MED ORDER — ACCU-CHEK AVIVA PLUS VI STRP: ORAL_STRIP | 1 refills | Status: DC

## 2019-08-07 NOTE — Telephone Encounter (Signed)
Rx resent with DX code.

## 2019-08-07 NOTE — Telephone Encounter (Signed)
Pharmacy called requesting a new RX sent for test strips - they need a diagnosis code.  [phone]

## 2019-08-08 NOTE — Progress Notes (Signed)
Please call to let patient know that the lab results are normal and no further action needed

## 2019-08-13 ENCOUNTER — Telehealth: Payer: Self-pay

## 2019-08-13 ENCOUNTER — Telehealth: Payer: Self-pay | Admitting: Endocrinology

## 2019-08-13 NOTE — Telephone Encounter (Signed)
She does not appear to be having any difficulties with speech and no new neurological symptoms.  Agree with LPN history and assessment

## 2019-08-13 NOTE — Telephone Encounter (Signed)
Pt called and stated that she thought she was having stroke like symptoms. Pt was immediately informed to hang up and dial 911. Pt refused and stated that she wanted to know what Dr. Dwyane Dee thought. Pt then hung up.  Pt was called back and had her explain her symptoms. Pt stated that when she had a new aide come this am, she was trying to say one thing and other words came out. I spoke with nursing aide and had her instruct pt to stick out her tongue and move in all directions. Aide reports that pt had no difficulties doing this. Pt does not report any new weakness on either side of body. Pt can walk with normal gait that is at her baseline abilities. Pt reports that she was aware this was happening and she just could not get the right words out.  Pt was then asked about her urine. She reports that urine is dark yellow to brown in color. Unknown if it has an odor or not, as pt is unable to detect an odor. Pt stated that she is not drinking enough fluids. She does report that she has some discomfort when voiding and thinks that it is possibly due to her cystitis.  Pt was informed that at any point, if she encounters symptoms that are concerning to her of a possible major issue such as a stroke, she is to immediately call 911. Pt verbalized understanding and stated that she feels better now, she was just concerned earlier.   Pt was informed that I would notify Dr. Dwyane Dee immediately and that she would receive a call later and check up on her. Pt verbalized understanding.

## 2019-08-13 NOTE — Telephone Encounter (Signed)
error 

## 2019-08-14 ENCOUNTER — Other Ambulatory Visit: Payer: Self-pay

## 2019-08-14 ENCOUNTER — Telehealth: Payer: Self-pay | Admitting: Endocrinology

## 2019-08-14 MED ORDER — ACCU-CHEK AVIVA PLUS VI STRP: ORAL_STRIP | 1 refills | Status: DC

## 2019-08-14 NOTE — Telephone Encounter (Signed)
Merry Proud with [facility] requests to be called at ph# [phone] re: Pharmacy needs a diagnosis code for patient's test strips

## 2019-08-14 NOTE — Telephone Encounter (Signed)
Vinie Sill and provided DX code for Rx.

## 2019-08-29 ENCOUNTER — Other Ambulatory Visit: Payer: Self-pay | Admitting: Endocrinology

## 2019-08-29 NOTE — Telephone Encounter (Signed)
Please refill if appropriate

## 2019-09-01 ENCOUNTER — Telehealth: Payer: Self-pay | Admitting: Endocrinology

## 2019-09-01 NOTE — Telephone Encounter (Signed)
Medication Refill Request  Did you call your pharmacy and request this refill first? Yes  . If patient has not contacted pharmacy first, instruct them to do so for future refills.  . Remind them that contacting the pharmacy for their refill is the quickest method to get the refill.  . Refill policy also stated that it will take anywhere between 24-72 hours to receive the refill.    Name of medication? xanax  Is this a 90 day supply? yes  Name and location of pharmacy?  [place], [place] Phone:  [phone]  Fax:  [phone]

## 2019-09-02 ENCOUNTER — Other Ambulatory Visit: Payer: Self-pay | Admitting: Endocrinology

## 2019-09-02 NOTE — Telephone Encounter (Signed)
A refill request has already been received by the pharmacy. It has been routed to Dr. Dwyane Dee and refill is pending his approval.

## 2019-09-03 ENCOUNTER — Other Ambulatory Visit: Payer: Self-pay | Admitting: Endocrinology

## 2019-09-23 ENCOUNTER — Other Ambulatory Visit: Payer: Self-pay | Admitting: Endocrinology

## 2019-09-29 ENCOUNTER — Other Ambulatory Visit: Payer: Self-pay | Admitting: Internal Medicine

## 2019-09-29 ENCOUNTER — Telehealth: Payer: Self-pay | Admitting: Endocrinology

## 2019-09-29 NOTE — Telephone Encounter (Signed)
She needs to check her blood pressure at home now and let us know if it is more than 170/90.  We will need to see her in the office if it is consistently high

## 2019-09-29 NOTE — Telephone Encounter (Signed)
Please see message and advise 

## 2019-09-29 NOTE — Telephone Encounter (Signed)
Patient wanted to call and inform dr that she was at dentist this morning and took blood pressure there and it was 209 over 119 - when she woke up she was feeling dizzy as well. Please advise. Ph# [phone]

## 2019-09-29 NOTE — Telephone Encounter (Signed)
Spoke to pt asked her if she checked her blood pressure when she got home? Pt said yes it was 150/67. It has come down. Told pt to monitor blood pressure if it is consistently 170/90 need to call office and schedule appt per Dr. Dwyane Dee. Pt verbalized understanding.

## 2019-10-02 ENCOUNTER — Other Ambulatory Visit: Payer: Self-pay | Admitting: Endocrinology

## 2019-10-27 ENCOUNTER — Telehealth: Payer: Self-pay | Admitting: Internal Medicine

## 2019-10-27 ENCOUNTER — Other Ambulatory Visit: Payer: Self-pay | Admitting: Gastroenterology

## 2019-10-27 ENCOUNTER — Other Ambulatory Visit: Payer: Self-pay

## 2019-10-27 ENCOUNTER — Other Ambulatory Visit: Payer: Self-pay | Admitting: Internal Medicine

## 2019-10-27 ENCOUNTER — Telehealth: Payer: Self-pay | Admitting: Orthopedic Surgery

## 2019-10-27 MED ORDER — PREDNISONE 10 MG PO TABS: 10.0000 mg | ORAL_TABLET | Freq: Every day | ORAL | 0 refills | Status: DC

## 2019-10-27 NOTE — Telephone Encounter (Signed)
Patient calling with left shoulder pain that runs to the shoulder blade. Hx COPD and degenerative scoliosis SOB is unchanged. Deep breath does not hurt more that ongoing pain. Pain "feels like fire and throbs". Pain score is 8/10  Please advise

## 2019-10-27 NOTE — Telephone Encounter (Signed)
I called and sw pt she states that she is having back pain which she has been seen for before in the past. She states that due to transportation issues she is not able to come in to the office this week but would like to see if she could have rx for something until she is able to come in. Ok per Dr. Sharol Given to call in prednisone 10 mg one po q am and see how this works and she can call with update if not helpful and will make appt at that time. Pt voiced understanding. Rx sent to  pharm.

## 2019-10-27 NOTE — Telephone Encounter (Signed)
Patient would like a call back from Dr. Jess Barters nurse. Patient states she need medical advice. Patient phone number is [phone].

## 2019-10-27 NOTE — Telephone Encounter (Signed)
She had shoulder and chest xrays in March that didn't show anything. Suggest she try a heating pad and an NSAI like ibuprofen.  If these don't help, suggest she go back to her orthopedist, Dr Sharol Given, who has seen her before.

## 2019-10-27 NOTE — Telephone Encounter (Signed)
Spoke with pt. She is aware of Dr. Janee Morn recommendation. Nothing further was needed.

## 2019-11-05 ENCOUNTER — Ambulatory Visit (INDEPENDENT_AMBULATORY_CARE_PROVIDER_SITE_OTHER): Payer: Medicare Other | Admitting: Endocrinology

## 2019-11-05 ENCOUNTER — Encounter: Payer: Self-pay | Admitting: Endocrinology

## 2019-11-05 ENCOUNTER — Other Ambulatory Visit: Payer: Self-pay

## 2019-11-05 ENCOUNTER — Other Ambulatory Visit: Payer: Self-pay | Admitting: Internal Medicine

## 2019-11-05 VITALS — BP 152/78 | HR 93 | Ht 63.0 in | Wt 179.4 lb

## 2019-11-05 DIAGNOSIS — Z794 Long term (current) use of insulin: Secondary | ICD-10-CM | POA: Diagnosis not present

## 2019-11-05 DIAGNOSIS — E1142 Type 2 diabetes mellitus with diabetic polyneuropathy: Secondary | ICD-10-CM | POA: Diagnosis not present

## 2019-11-05 DIAGNOSIS — I10 Essential (primary) hypertension: Secondary | ICD-10-CM

## 2019-11-05 DIAGNOSIS — M19041 Primary osteoarthritis, right hand: Secondary | ICD-10-CM | POA: Diagnosis not present

## 2019-11-05 DIAGNOSIS — M19042 Primary osteoarthritis, left hand: Secondary | ICD-10-CM

## 2019-11-05 DIAGNOSIS — E1165 Type 2 diabetes mellitus with hyperglycemia: Secondary | ICD-10-CM

## 2019-11-05 LAB — POCT GLYCOSYLATED HEMOGLOBIN (HGB A1C): Hemoglobin A1C: 11.7 % — AB (ref 4.0–5.6)

## 2019-11-05 MED ORDER — IRBESARTAN 150 MG PO TABS
150.0000 mg | ORAL_TABLET | Freq: Every day | ORAL | 5 refills | Status: DC
Start: 2019-11-05 — End: 2020-01-23

## 2019-11-05 MED ORDER — CELECOXIB 100 MG PO CAPS
100.0000 mg | ORAL_CAPSULE | Freq: Every day | ORAL | 1 refills | Status: DC
Start: 2019-11-05 — End: 2020-01-16

## 2019-11-05 NOTE — Patient Instructions (Addendum)
With Prednisone increase Novolog by 6-8 units at lunch and dinner  Irbestartan 150 mg daily  Check blood sugars on waking up 7 days a week  Also check blood sugars about 2 hours after meals and do this after different meals by rotation  Recommended blood sugar levels on waking up are 90-130 and about 2 hours after meal is 130-180  Please bring your blood sugar monitor to each visit, thank you

## 2019-11-05 NOTE — Progress Notes (Signed)
Patient ID: [name], female   DOB: 1935/10/21, 84 y.o.   MRN: [id number]    Reason for Appointment: Diabetes and follow-up of multiple problems  History of Present Illness    PROBLEM 1:  DIABETES type II since 1980   She has been on insulin using Lantus and Humalog for several years and usually A1c around 8% despite fairly good compliance with diet, exercise and glucose monitoring. She also had been on Actos previously which probably benefited her especially with her fatty liver but she stopped it because of fear of side effects   She was changed from NovoLog to U-500 insulin starting in 02/2016; she did not related was controlling her sugars and preferred to go back to NovoLog.    Recent history:   Insulin regimen: Tresiba  56 units in a.m.  NOVOLOG before meals 14 units before breakfast and lunch and 12-18 at dinnertime  Her A1c is now at the highest level of 11.7 compared to 10.3   She has had much higher blood sugars overall  Although she apparently had not been on prednisone at the beginning of the month she was having markedly increased morning readings, not clear if these were all before breakfast  She is back on prednisone the last 9 days from the orthopedic surgeon  With not monitoring her blood sugars consistently difficult to get a pattern but her blood sugars are always high in the evenings before and after dinner  Only sporadically will have a relatively good reading in the morning including yesterday  On Monday she felt hypoglycemic when she was walking in the grocery store around lunchtime  She thinks her appetite has been variable and she is still eating small portions  Weight is down 2 pounds  She thinks she is not forgetting to take her Tyler Aas  Usually trying to take NovoLog when starting to eat  Has not adjusted her NovoLog despite very high readings   Oral hypoglycemic drugs: none       Side effects from medications: None           Proper timing of medications in relation to meals: Yes.          Monitors blood glucose:  Up to 4 times a day   Glucometer:  Accu-Chek          Blood Glucose readings   PRE-MEAL Fasting Lunch Dinner Bedtime Overall  Glucose range:  102-395  78-373  250-335  251-440   Mean/median:  240  240   253   Previous readings:  PRE-MEAL Fasting Lunch Dinner  overnight Overall  Glucose range:  54-211  155-215   234-306   Mean/median:  140  187   260  191   POST-MEAL PC Breakfast PC Lunch PC Dinner  Glucose range:    191-311  Mean/median:    260     Meals: 3 meals per day.  Breakfast 7 AM ; lunch 1-2 pm Supper 6 to 7 PM     Physical activity: exercise: Unable to do any because of dyspnea and fatigue            Dietician visit: Most recent: Several years ago            Complications: are: Peripheral neuropathy with sensory loss     Wt Readings from Last 3 Encounters:  11/05/19 179 lb 6.4 oz (81.4 kg)  08/04/19 182 lb (82.6 kg)  06/04/19 179 lb 12.8 oz (81.6 kg)  Lab Results  Component Value Date   HGBA1C 11.7 (A) 11/05/2019   HGBA1C 10.3 (A) 06/04/2019   HGBA1C 10.6 (A) 12/09/2018   Lab Results  Component Value Date   MICROALBUR 1.1 05/11/2017   LDLCALC 86 08/04/2019   CREATININE 0.66 08/04/2019     OTHER Current problems  are detailed in review of systems   Allergies as of 11/05/2019      Reactions   Chlordiazepoxide-clidinium Other (See Comments)   Sleepy,weak   Biaxin [clarithromycin] Other (See Comments)   Foul taste, abd pain, diarrhea   Tramadol Other (See Comments)   Caused tremors   Codeine Other (See Comments)   REACTION: gi upset- only in high doses per pt.  Can tolerate cough syrup with codeine.       Medication List       Accurate as of November 05, 2019 11:06 AM. If you have any questions, ask your nurse or doctor.        Accu-Chek Aviva Plus test strip Generic drug: glucose blood Use as instructed to check blood sugar 4 times a day. DX:E11.65    Accu-Chek Softclix Lancet Dev Kit 1 each by Does not apply route daily. Use as instructed to check blood sugar 8 times daily.   Accu-Chek Softclix Lancets lancets 1 each by Other route daily. Use as instructed to check blood sugar 8 times daily.   AeroChamber MV inhaler Use as instructed   albuterol 108 (90 Base) MCG/ACT inhaler Commonly known as: VENTOLIN HFA Inhale 2 puffs into the lungs every 6 (six) hours as needed for wheezing or shortness of breath.   ALPRAZolam 0.5 MG tablet Commonly known as: XANAX TAKE 1/2 TO 1 TABLET TWICE DAILY AS NEEDED FOR ANXIETY.   aspirin 325 MG tablet Take 325 mg by mouth daily.   azelastine 0.1 % nasal spray Commonly known as: ASTELIN Place 1 spray into both nostrils 2 (two) times daily. Use in each nostril as directed   cholestyramine 4 g packet Commonly known as: Questran 1 scoop/pack twice a day as needed.   dicyclomine 10 MG capsule Commonly known as: BENTYL TAKE (1) CAPSULE TWICE DAILY.   Insulin Pen Needle 31G X 8 MM Misc Commonly known as: B-D ULTRAFINE III SHORT PEN TEST 3 TIMES A DAY   B-D UF III MINI PEN NEEDLES 31G X 5 MM Misc Generic drug: Insulin Pen Needle USE 5 PER DAY TO INJECT INSULIN   Insulin Syringe-Needle U-100 31G X 15/64" 0.5 ML Misc Commonly known as: BD Insulin Syringe Ultrafine Use 2 per day dx code E11.65   ipratropium-albuterol 0.5-2.5 (3) MG/3ML Soln Commonly known as: DUONEB 1 vial in neb every 8 hours and as needed   irbesartan 75 MG tablet Commonly known as: Avapro Take 1 tablet (75 mg total) by mouth daily.   magic mouthwash Soln Swish with 2 teaspoonfuls by mouth as needed What changed:   when to take this  reasons to take this   meclizine 25 MG tablet Commonly known as: ANTIVERT take 1 tablet by mouth three times a day if needed for dizziness/vertigo What changed:   when to take this  reasons to take this   methenamine 0.5 GM tablet Commonly known as: MANDELAMINE    metoCLOPramide 5 MG tablet Commonly known as: REGLAN TAKE  (1)  TABLET  FOUR TIMES DAILY.   metoprolol succinate 25 MG 24 hr tablet Commonly known as: TOPROL-XL TAKE 1 TABLET ONCE DAILY.   mupirocin ointment 2 % Commonly known  as: BACTROBAN Apply 1 application topically 2 (two) times daily. Apply to the affected area 2 times a day   NovoLOG FlexPen 100 UNIT/ML FlexPen Generic drug: insulin aspart Inject 12-14 units under the skin at breakfast 14-16 at lunch, and 12-16 at dinner.   omeprazole 20 MG capsule Commonly known as: PRILOSEC TAKE (1) CAPSULE DAILY.   OXYGEN Inhale 3 L into the lungs continuous.   pravastatin 20 MG tablet Commonly known as: PRAVACHOL TAKE 1 TABLET ONCE DAILY.   predniSONE 10 MG tablet Commonly known as: DELTASONE Take 1 tablet (10 mg total) by mouth daily with breakfast.   sodium chloride HYPERTONIC 3 % nebulizer solution 1 ampule neb every 6 hours if needed to clear mucus   Stiolto Respimat 2.5-2.5 MCG/ACT Aers Generic drug: Tiotropium Bromide-Olodaterol USE 2 PUFFS DAILY   Tresiba FlexTouch 200 UNIT/ML FlexTouch Pen Generic drug: insulin degludec Inject 58 Units into the skin daily.   Vitamin D (Ergocalciferol) 1.25 MG (50000 UNIT) Caps capsule Commonly known as: DRISDOL Take 1 capsule (50,000 Units total) by mouth once a week.       Allergies:  Allergies  Allergen Reactions  . Chlordiazepoxide-Clidinium Other (See Comments)    Sleepy,weak  . Biaxin [Clarithromycin] Other (See Comments)    Foul taste, abd pain, diarrhea  . Tramadol Other (See Comments)    Caused tremors  . Codeine Other (See Comments)    REACTION: gi upset- only in high doses per pt.  Can tolerate cough syrup with codeine.     Past Medical History:  Diagnosis Date  . Allergic rhinitis, cause unspecified    Sinus CT Rec 12-23-2009  . Arthritis   . Benign paroxysmal positional vertigo 08/09/2012  . Chronic airway obstruction, not elsewhere classified    HFA  75-90% after coaching 12-23-2009  . Complication of anesthesia    takes along time to wake up  . Diabetes mellitus   . Diarrhea   . Diverticulosis   . Esophageal reflux   . Esophageal stricture   . GERD (gastroesophageal reflux disease)   . Glaucoma   . Hiatal hernia   . Hypertension   . Irritable bowel syndrome   . Neuropathy in diabetes ([place]) 08/09/2012  . Other chronic nonalcoholic liver disease   . Oxygen deficiency   . Sciatica   . Scoliosis   . Shingles 2010  . Shortness of breath   . Steatohepatitis     Past Surgical History:  Procedure Laterality Date  . APPENDECTOMY    . CARPAL TUNNEL RELEASE     right hand  . CHOLECYSTECTOMY OPEN  1978  . COLONOSCOPY  07-2001   mild diverticulosis  . ESOPHAGOGASTRODUODENOSCOPY  3790,24-09   H Hernia,es.stricture s/p dil 33F  . INTRAOCULAR LENS INSERTION Bilateral   . LIVER BIOPSY  [phone]  . PARATHYROID EXPLORATION    . TONSILLECTOMY AND ADENOIDECTOMY    . TOTAL ABDOMINAL HYSTERECTOMY    . ULNAR NERVE TRANSPOSITION  12/07/2011   Procedure: ULNAR NERVE DECOMPRESSION/TRANSPOSITION;this was cancelled-not done  Surgeon: Cammie Sickle., MD;  Location: [facility];  Service: Orthopedics;  Laterality: Right;  right ulnar nerve in situ decompression  . ULNAR TUNNEL RELEASE  03/07/2012   Procedure: CUBITAL TUNNEL RELEASE;  Surgeon: Roseanne Kaufman, MD;  Location: [facility];  Service: Orthopedics;  Laterality: Right;  ulnar nerve release at the elbow      Family History  Problem Relation Age of Onset  . Heart disease Father   .  Lung cancer Mother        small cell;Byssinosis  . Lung cancer Sister   . Liver cancer Sister        ? mets from another area of the body  . Diabetes Other        grandmother  . Stroke Maternal Grandfather     Social History:  reports that she quit smoking about 31 years ago. Her smoking use included cigarettes. She has never used smokeless tobacco. She reports that she does  not drink alcohol and does not use drugs.  Review of Systems:  DIARRHEA: She has had chronic diarrhea treated with Questran or Colestid, reportedly from irritable bowel Has been to several gastroenterologists Diarrhea has been only occasional and not on treatment now, may take OTC Imodium  HYPERTENSION:  She was continued on 75 mg of irbesartan    Has checked blood pressure at home occasionally and she thinks it is frequently high up to 638 systolic but does not remember exact readings She thinks her blood pressure is higher from stress this:   BP Readings from Last 3 Encounters:  11/05/19 (!) 152/78  08/04/19 (!) 144/76  06/04/19 (!) 150/68    She takes Xanax at night as needed for sleep and lorazepam as needed for anxiety  HYPERLIPIDEMIA:  She has had mixed hyperlipidemia, LDL treated with pravastatin 20 mg Last LDL adequately controlled   Lab Results  Component Value Date   CHOL 160 08/04/2019   HDL 41.70 08/04/2019   LDLCALC 86 08/04/2019   LDLDIRECT 89.0 10/14/2014   TRIG 164.0 (H) 08/04/2019   CHOLHDL 4 08/04/2019    She is followed by pulmonologist for chronic COPD, using home oxygen    Vitamin D level previously low and better with D3 supplement   Lab Results  Component Value Date   VD25OH 38.42 12/09/2018   VD25OH 19.95 (L) 02/06/2018   She is asking about pains in her hands, these are sometimes worse and occasionally sharp Maybe a little better with taking prednisone recently    Examination:   BP (!) 152/78 (BP Location: Left Arm, Patient Position: Sitting)   Pulse 93   Ht _0  (1.6 m)   Wt 179 lb 6.4 oz (81.4 kg)   SpO2 97%   BMI 31.78 kg/m   Body mass index is 31.78 kg/m.   Osteoarthritic changes present in hands especially the first interphalangeal joints  ASSESSMENT/ PLAN:    Diabetes type 2 with obesity, insulin-dependent  See history of present illness for detailed discussion of  current management, blood sugar patterns and  problems identified  Her A1c is now 11.7  She continues to have significantly high blood sugars especially after meals Not monitoring blood sugars enough lately Difficult to know why her blood sugars are periodically even over 400 readings especially in the evenings and late at night Her fasting blood sugars have been variable and not clear why they were much higher in the mornings about 2 to 3 weeks ago Recently because of prednisone has higher readings in the afternoons and evenings but she is not monitoring enough  Recommendations:  Increase NovoLog by about 6 units at lunch and dinner regardless of her intake  May reduce the insulin by 2 to 4 units when planning to go out shopping or other activities  Start monitoring blood sugars consistently 4 times a day especially after lunch and dinner  Discussed that she can qualify for the Dexcom only if she is monitoring 4 times  a day  HYPERTENSION: Has mostly higher blood pressure readings lately even at home with 75 mg Avapro We will go up to 150 on the irbesartan daily and for convenience can take this in the morning along with metoprolol She will monitor blood pressure consistently and call us if higher   LIPIDS: Adequately controlled and will recheck on the next visit  Osteoarthritis: She will be given a trial of Celebrex 100 mg daily with food, she will need to be continuing her Prilosec also To call if she has any GI distress with this but will stop the medication in 2 weeks if she does not have significant benefit  She has not gone for her Covid vaccinations partly because of transportation issues   There are no Patient Instructions on file for this visit.    [name] 11/05/2019, 11:06 AM

## 2019-11-13 ENCOUNTER — Telehealth: Payer: Self-pay

## 2019-11-13 NOTE — Telephone Encounter (Signed)
Patient called in saying she is having pain in left side of her knee. Says she got up this morning and couldn't stand 10 minutes and now has pain in right knee. I asked her about sch an appt but she said she rather discuss over the phone before coming.

## 2019-11-13 NOTE — Telephone Encounter (Signed)
Appt made for tomorrow due to transportation issues at 10 am.

## 2019-11-14 ENCOUNTER — Ambulatory Visit: Payer: Medicare Other | Admitting: Family

## 2019-11-17 ENCOUNTER — Ambulatory Visit: Payer: Medicare Other | Admitting: Orthopedic Surgery

## 2019-11-24 ENCOUNTER — Ambulatory Visit: Payer: Self-pay

## 2019-11-24 ENCOUNTER — Other Ambulatory Visit: Payer: Self-pay

## 2019-11-24 ENCOUNTER — Ambulatory Visit (INDEPENDENT_AMBULATORY_CARE_PROVIDER_SITE_OTHER): Payer: Medicare Other

## 2019-11-24 ENCOUNTER — Encounter: Payer: Self-pay | Admitting: Orthopedic Surgery

## 2019-11-24 ENCOUNTER — Ambulatory Visit (INDEPENDENT_AMBULATORY_CARE_PROVIDER_SITE_OTHER): Payer: Medicare Other | Admitting: Orthopedic Surgery

## 2019-11-24 VITALS — Ht 63.0 in | Wt 178.0 lb

## 2019-11-24 DIAGNOSIS — M25562 Pain in left knee: Secondary | ICD-10-CM

## 2019-11-24 DIAGNOSIS — M25561 Pain in right knee: Secondary | ICD-10-CM | POA: Diagnosis not present

## 2019-11-24 DIAGNOSIS — G8929 Other chronic pain: Secondary | ICD-10-CM

## 2019-11-24 NOTE — Progress Notes (Signed)
Office Visit Note   Patient: [name]           Date of Birth: Jun 19, 1935           MRN: [id number] Visit Date: 11/24/2019              Requested by: [name], MD [place],  [place] [address] PCP: [name], MD  Chief Complaint  Patient presents with   Left Knee - Pain   Right Knee - Pain      HPI: Patient is an 84 year old woman who presents of increasing bilateral knee pain over the medial and lateral joint lines complains of pain with weightbearing denies any grinding swelling or giving way currently ambulates with a cane with nasal cannula FiO2.  Patient states that she is undergone right elbow surgery with Dr. Amedeo Plenty  Assessment & Plan: Visit Diagnoses:  1. Chronic pain of both knees     Plan: Patient tolerated the injections for both knees well patient would not be a good candidate for total knee replacement reevaluate in 4 weeks.  Follow-Up Instructions: No follow-ups on file.   Ortho Exam  Patient is alert, oriented, no adenopathy, well-dressed, normal affect, normal respiratory effort. Examination patient is on nasal cannula FiO2 she states she has significant pulmonary disease.  She has mild crepitation with range of motion of both knees there is no effusion of either knee there is no redness no cellulitis she is tender to palpation over the medial and lateral joint lines bilaterally.  Imaging: XR Knee 1-2 Views Left  Result Date: 11/24/2019 2 view radiographs of the left knee shows medial joint line narrowing no osteophytic bone spurs with calcification of the popliteal vessels.  XR Knee 1-2 Views Right  Result Date: 11/24/2019 2 view radiographs of the right knee shows medial joint line narrowing no osteophytic bone spurs there is some mild calcification of the popliteal vessel.  No images are attached to the encounter.  Labs: Lab Results  Component Value Date   HGBA1C 11.7 (A) 11/05/2019   HGBA1C 10.3 (A) 06/04/2019     HGBA1C 10.6 (A) 12/09/2018   LABURIC 4.1 11/16/2014   REPTSTATUS 11/09/2016 FINAL 11/08/2016   CULT NO GROWTH 11/08/2016   LABORGA STAPHYLOCOCCUS SPECIES (COAGULASE NEGATIVE) 05/24/2015     Lab Results  Component Value Date   ALBUMIN 4.0 08/04/2019   ALBUMIN 4.1 12/09/2018   ALBUMIN 4.1 02/06/2018   LABURIC 4.1 11/16/2014    Lab Results  Component Value Date   MG 1.6 12/20/2016   MG 1.7 10/18/2015   Lab Results  Component Value Date   VD25OH 38.42 12/09/2018   VD25OH 19.95 (L) 02/06/2018    No results found for: PREALBUMIN CBC EXTENDED Latest Ref Rng & Units 08/04/2019 11/06/2017 05/07/2017  WBC 4.0 - 10.5 K/uL 8.4 11.2(H) 8.5  RBC 3.87 - 5.11 Mil/uL 5.21(H) 4.92 5.15(H)  HGB 12.0 - 15.0 g/dL 13.7 12.8 13.5  HCT 36 - 46 % 43.3 41.1 43.0  PLT 150 - 400 K/uL 166.0 181.0 187.0  NEUTROABS 1.4 - 7.7 K/uL 3.7 6.1 4.8  LYMPHSABS 0.7 - 4.0 K/uL 3.9 4.2(H) 3.1     Body mass index is 31.53 kg/m.  Orders:  Orders Placed This Encounter  Procedures   XR Knee 1-2 Views Right   XR Knee 1-2 Views Left   No orders of the defined types were placed in this encounter.    Procedures: Large Joint Inj: bilateral knee on  11/24/2019 12:46 PM Indications: pain and diagnostic evaluation Details: 22 G 1.5 in needle, anteromedial approach  Arthrogram: No  Outcome: tolerated well, no immediate complications Procedure, treatment alternatives, risks and benefits explained, specific risks discussed. Consent was given by the patient. Immediately prior to procedure a time out was called to verify the correct patient, procedure, equipment, support staff and site/side marked as required. Patient was prepped and draped in the usual sterile fashion.      Clinical Data: No additional findings.  ROS:  All other systems negative, except as noted in the HPI. Review of Systems  Objective: Vital Signs: Ht 5' 3"  (1.6 m)    Wt 178 lb (80.7 kg)    BMI 31.53 kg/m   Specialty Comments:   No specialty comments available.  PMFS History: Patient Active Problem List   Diagnosis Date Noted   Scoliosis due to degenerative disease of spine in adult patient 09/10/2018   Radicular pain of left lower extremity 09/10/2018   Diarrhea 11/08/2016   Thrombocytopenia ([place]) 11/08/2016   Degenerative lumbar spinal stenosis 10/27/2015   Arthralgia 09/22/2015   Chronic respiratory failure with hypoxia ([place]) 01/11/2015   Diabetic polyneuropathy associated with type 2 diabetes mellitus ([place]) 11/16/2014   Essential hypertension, benign 05/19/2013   Vertigo 05/11/2013   Dizziness 05/11/2013   Tachycardia 05/11/2013   Lower urinary tract infectious disease 05/11/2013   Sinusitis 05/11/2013   Other and unspecified hyperlipidemia 02/15/2013   Type II diabetes mellitus, uncontrolled ([place]) 01/01/2013   Benign paroxysmal positional vertigo 08/09/2012   Type II or unspecified type diabetes mellitus with neurological manifestations, uncontrolled(250.62) 08/09/2012   GLAUCOMA 03/16/2010   HOARSENESS 03/16/2010   COUGH 12/23/2009   CHEST PAIN 12/23/2009   MUSCULOSKELETAL PAIN 01/28/2009   COPD mixed type ([place]) 01/15/2009   Seasonal and perennial allergic rhinitis 11/23/2007   CHRONIC RHINITIS 07/16/2007   G E R D 05/29/2007   I B S-DIARRHEAL PREDOMINATE 05/29/2007   FATTY LIVER DISEASE 05/29/2007   Past Medical History:  Diagnosis Date   Allergic rhinitis, cause unspecified    Sinus CT Rec 12-23-2009   Arthritis    Benign paroxysmal positional vertigo 08/09/2012   Chronic airway obstruction, not elsewhere classified    HFA 75-90% after coaching [id number]   Complication of anesthesia    takes along time to wake up   Diabetes mellitus    Diarrhea    Diverticulosis    Esophageal reflux    Esophageal stricture    GERD (gastroesophageal reflux disease)    Glaucoma    Hiatal hernia    Hypertension    Irritable bowel syndrome    Neuropathy in  diabetes ([place]) 08/09/2012   Other chronic nonalcoholic liver disease    Oxygen deficiency    Sciatica    Scoliosis    Shingles 2010   Shortness of breath    Steatohepatitis     Family History  Problem Relation Age of Onset   Heart disease Father    Lung cancer Mother        small cell;Byssinosis   Lung cancer Sister    Liver cancer Sister        ? mets from another area of the body   Diabetes Other        grandmother   Stroke Maternal Grandfather     Past Surgical History:  Procedure Laterality Date   APPENDECTOMY     CARPAL TUNNEL RELEASE     right hand   CHOLECYSTECTOMY OPEN  1978  COLONOSCOPY  07-2001   mild diverticulosis   ESOPHAGOGASTRODUODENOSCOPY  7076,15-18   H Hernia,es.stricture s/p dil 29F   INTRAOCULAR LENS INSERTION Bilateral    LIVER BIOPSY  07-4371   PARATHYROID EXPLORATION     TONSILLECTOMY AND ADENOIDECTOMY     TOTAL ABDOMINAL HYSTERECTOMY     ULNAR NERVE TRANSPOSITION  12/07/2011   Procedure: ULNAR NERVE DECOMPRESSION/TRANSPOSITION;this was cancelled-not done  Surgeon: Cammie Sickle., MD;  Location: [facility];  Service: Orthopedics;  Laterality: Right;  right ulnar nerve in situ decompression   ULNAR TUNNEL RELEASE  03/07/2012   Procedure: CUBITAL TUNNEL RELEASE;  Surgeon: Roseanne Kaufman, MD;  Location: [facility];  Service: Orthopedics;  Laterality: Right;  ulnar nerve release at the elbow     Social History   Occupational History   Occupation: Retired   Tobacco Use   Smoking status: Former Smoker    Types: Cigarettes    Quit date: 05/08/1988    Years since quitting: 31.5   Smokeless tobacco: Never Used  Scientific laboratory technician Use: Never used  Substance and Sexual Activity   Alcohol use: No    Alcohol/week: 0.0 standard drinks   Drug use: No   Sexual activity: Never

## 2019-12-02 ENCOUNTER — Ambulatory Visit: Payer: Medicare Other | Admitting: Internal Medicine

## 2019-12-03 ENCOUNTER — Other Ambulatory Visit: Payer: Self-pay

## 2019-12-03 ENCOUNTER — Encounter: Payer: Self-pay | Admitting: Endocrinology

## 2019-12-03 ENCOUNTER — Telehealth: Payer: Self-pay

## 2019-12-03 MED ORDER — MAGIC MOUTHWASH: ORAL | 0 refills | Status: DC

## 2019-12-03 NOTE — Telephone Encounter (Signed)
Pt has self-dismissed. Process should be followed with letter and form to be sent.

## 2019-12-03 NOTE — Telephone Encounter (Signed)
Patient also requested that all of her upcoming appointments be canceled. This has been done per pt's request.

## 2019-12-03 NOTE — Telephone Encounter (Signed)
Received a request from [facility] asking for a refill on magic mouthwash. At this time, are you still the pt's PCP or has she established elsewhere?

## 2019-12-03 NOTE — Telephone Encounter (Signed)
Letter has been done

## 2019-12-03 NOTE — Telephone Encounter (Signed)
She has refused to find a PCP.  Need to know why she needs this and then refill it 1 time

## 2019-12-03 NOTE — Telephone Encounter (Signed)
Called patient and gave her MD message below. Pt began very angry and began using profane language and yelling at me because of the message that she was given. Pt stated that she would like [place] to disregard this request and "don't worry about it anymore."

## 2019-12-03 NOTE — Telephone Encounter (Signed)
Letter has been sent off

## 2019-12-04 ENCOUNTER — Telehealth: Payer: Self-pay | Admitting: Endocrinology

## 2019-12-04 NOTE — Telephone Encounter (Signed)
Patient requests to be called at ph# [phone] re: Patient wants if Dr. Dwyane Dee still wants to treat her Diabetes. If not, patient requests that Dr. Dwyane Dee let patient know who (Doctor) he would recommend

## 2019-12-05 ENCOUNTER — Telehealth: Payer: Self-pay | Admitting: Endocrinology

## 2019-12-05 ENCOUNTER — Telehealth: Payer: Self-pay | Admitting: *Deleted

## 2019-12-05 NOTE — Telephone Encounter (Signed)
Received a fax from [facility] for a refill on Magic mouthwash.  Fax stated that the patient uses it for sores in her mouth.  I called and had to leave a message for the patient to get clarification of the need for the refill.  Will await her call.

## 2019-12-05 NOTE — Telephone Encounter (Signed)
Patient dismissed from [facility] by [name], MD, effective 12/03/19. Dismissal Letter sent out by 1st class mail. KLM

## 2019-12-05 NOTE — Telephone Encounter (Signed)
Patient has been dismissed. Certified letter sent.

## 2019-12-08 NOTE — Telephone Encounter (Signed)
Pt states that the pharmacy called the wrong doctor. Nothing further needed.

## 2019-12-08 NOTE — Telephone Encounter (Signed)
Pt returning a phone call. Pt can be reached at [phone].

## 2019-12-10 ENCOUNTER — Other Ambulatory Visit: Payer: Self-pay | Admitting: Endocrinology

## 2019-12-12 ENCOUNTER — Telehealth: Payer: Self-pay | Admitting: Internal Medicine

## 2019-12-12 DIAGNOSIS — I10 Essential (primary) hypertension: Secondary | ICD-10-CM

## 2019-12-12 NOTE — Telephone Encounter (Signed)
ATC pt, received busy signal x2. Will try back. 

## 2019-12-14 DIAGNOSIS — R069 Unspecified abnormalities of breathing: Secondary | ICD-10-CM | POA: Diagnosis not present

## 2019-12-14 DIAGNOSIS — E1165 Type 2 diabetes mellitus with hyperglycemia: Secondary | ICD-10-CM | POA: Diagnosis not present

## 2019-12-14 DIAGNOSIS — I10 Essential (primary) hypertension: Secondary | ICD-10-CM | POA: Diagnosis not present

## 2019-12-15 ENCOUNTER — Telehealth: Payer: Self-pay | Admitting: Internal Medicine

## 2019-12-15 NOTE — Telephone Encounter (Signed)
Called and spoke with patient she states that she has been having a Jittery/flutter feeling in her chest that makes it hard for her to breath and takes her breath away when it happens. Patient has COPD. She also states that her blood pressure has been high lately and that she recently got a letter from Dr. Dwyane Dee is no longer able to be her PCP and that she needs to find someone else.  Letter states:  I find it necessary to inform you that [facility] endocrin[facility] will no longer be able to provide medical care to you, because of your opting not to follow-up for medical care or to establish with a primary care physician.  Patient wants to know if you have any recommendations on a provider for her to see because most MD's will not see her due to her having to many problems. She also thinks that she may need to come in for a visit with you to see what is going on with her. You do not have anything open this week and you have some RNA slots next week if available.     Please advise

## 2019-12-15 NOTE — Telephone Encounter (Signed)
I can see her in person - ok RNA slot

## 2019-12-16 NOTE — Telephone Encounter (Signed)
Yes- please help her establish with PCP

## 2019-12-16 NOTE — Telephone Encounter (Signed)
Spoke with pt. She was wanting CY to refer her to a new PCP. She received a letter from Dr. Dwyane Dee that she would need to find a new PCP.  CY - please advise if we can place this referral. Thanks.

## 2019-12-16 NOTE — Telephone Encounter (Signed)
Called and spoke with pt and have scheduled her for an appt with CY. Stated to pt if she does become worse between now and appt to go to ED for further evaluation. Pt verbalized understanding. Nothing further needed.

## 2019-12-16 NOTE — Telephone Encounter (Signed)
Spoke with the pt and advised will place referral for new PCP for her Referral sent

## 2019-12-17 ENCOUNTER — Ambulatory Visit: Payer: Medicare Other | Admitting: Endocrinology

## 2019-12-19 IMAGING — DX DG CHEST 2V
2 series · 2 of 2 positions shown · non-contrast
Comparison: 01/13/2016

CLINICAL DATA: Left shoulder blade and upper back pain 4 months.

EXAM:
CHEST  2 VIEW

[chest pa]
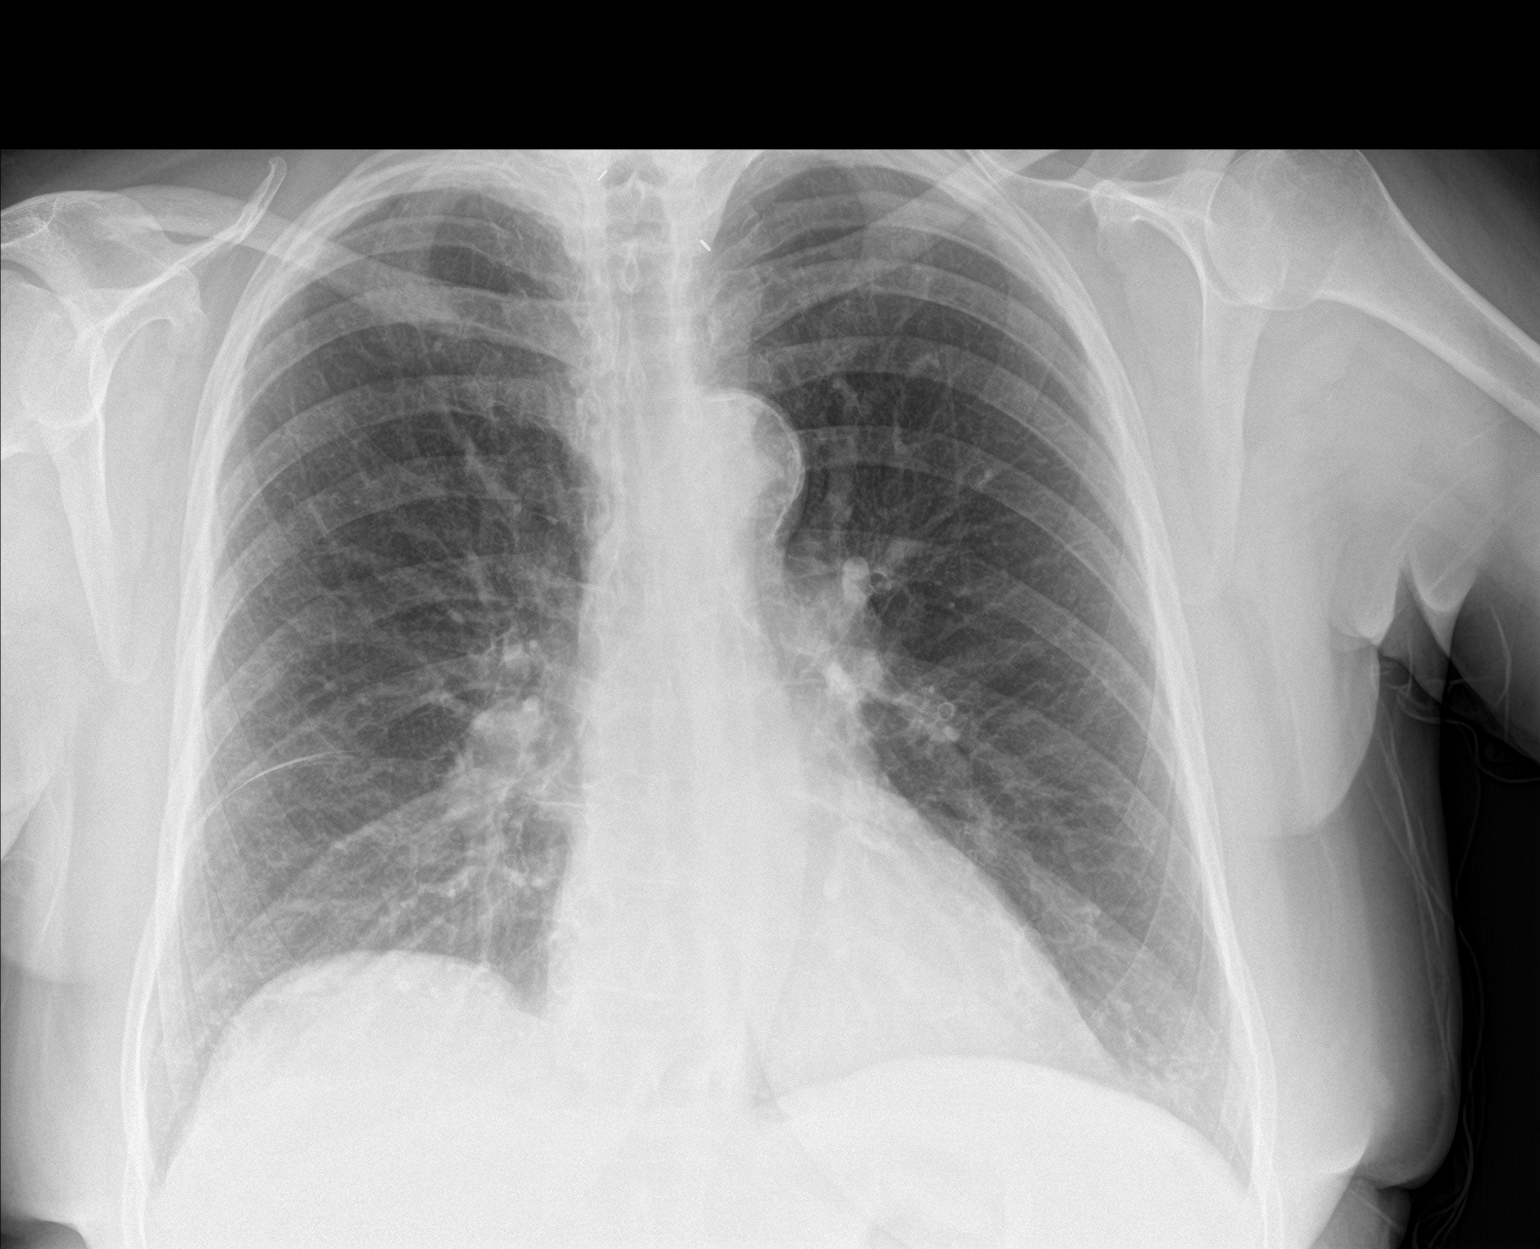

[chest lat]
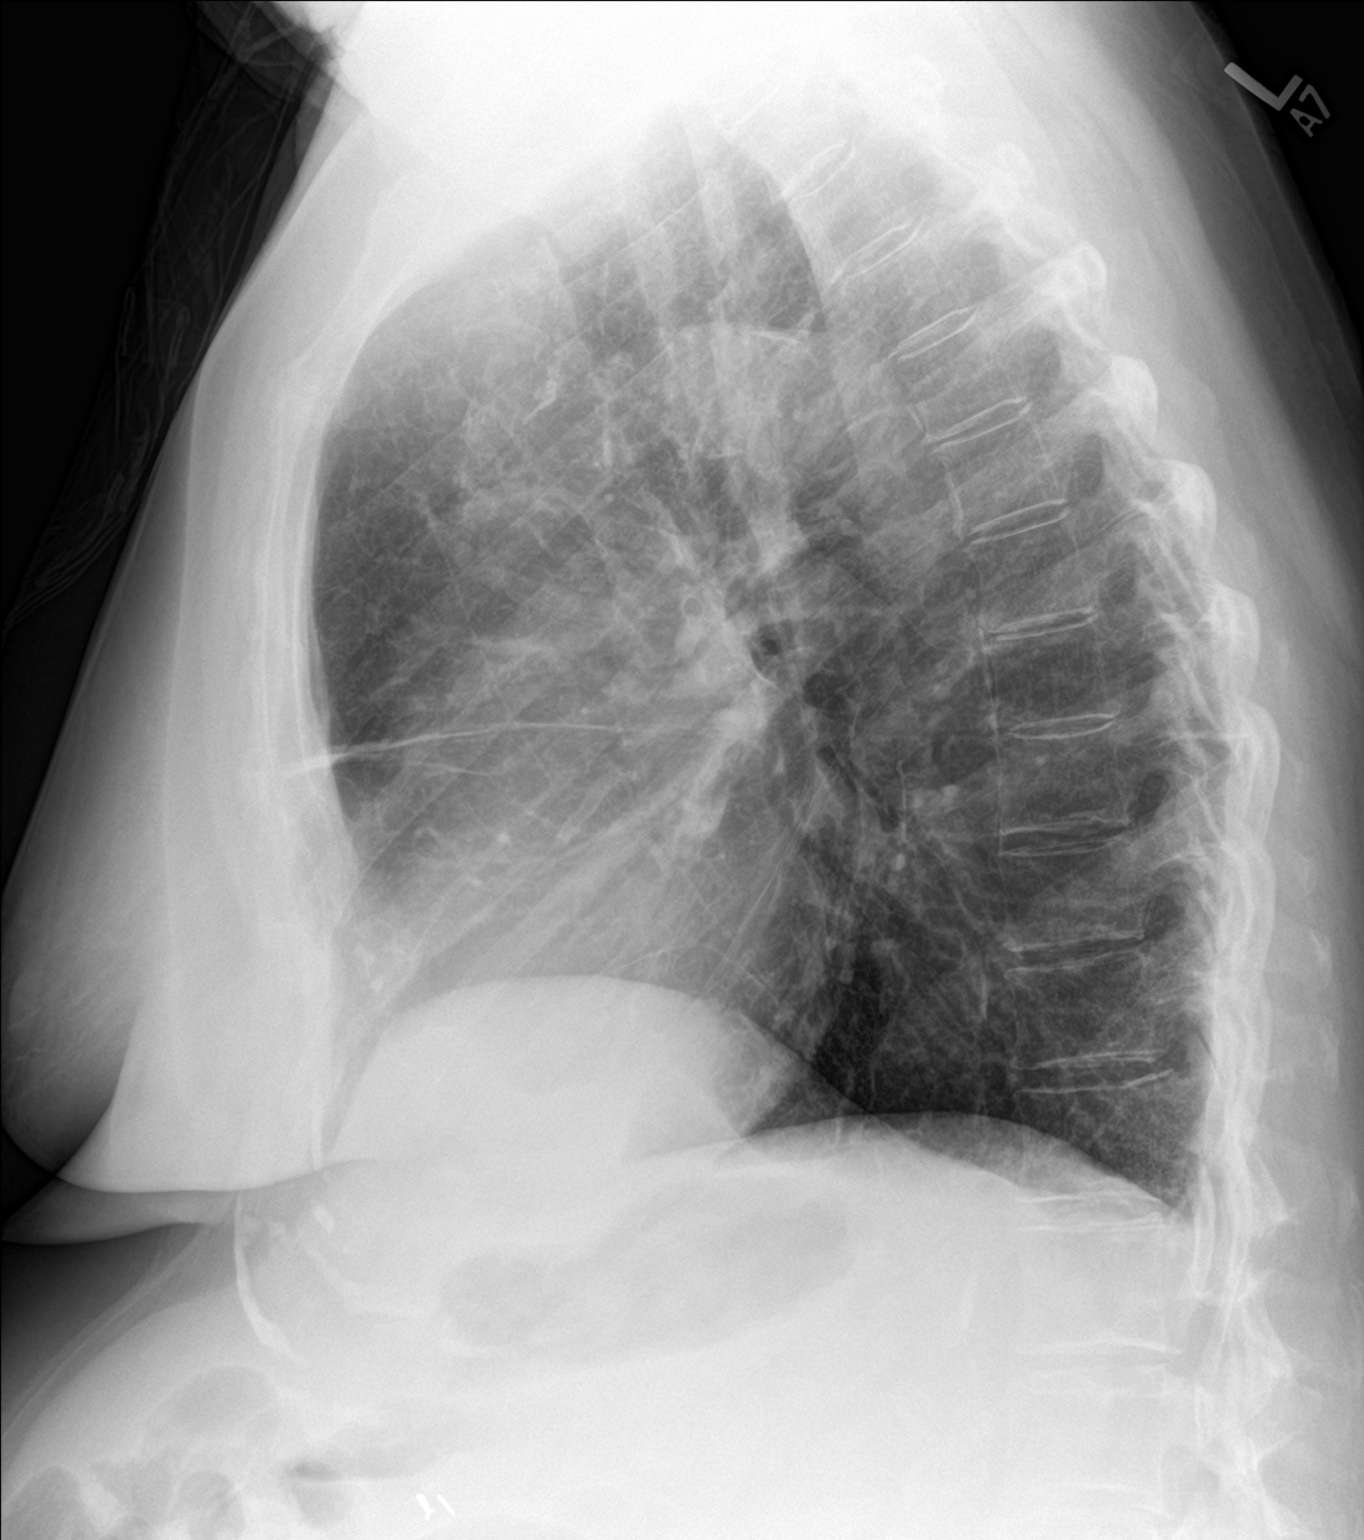

[2 of 2 positions shown; findings below may reference images not displayed]

FINDINGS: Lungs are adequately inflated without focal consolidation or
effusion. Cardiomediastinal silhouette is within normal. There is
calcified plaque over the thoracic aorta. Mild degenerate change of
the spine.
IMPRESSION: No active cardiopulmonary disease.

## 2019-12-25 ENCOUNTER — Ambulatory Visit: Payer: Medicare Other | Admitting: Orthopedic Surgery

## 2019-12-26 ENCOUNTER — Ambulatory Visit (INDEPENDENT_AMBULATORY_CARE_PROVIDER_SITE_OTHER): Payer: Medicare Other | Admitting: Physician Assistant

## 2019-12-26 ENCOUNTER — Encounter: Payer: Self-pay | Admitting: Internal Medicine

## 2019-12-26 ENCOUNTER — Ambulatory Visit (INDEPENDENT_AMBULATORY_CARE_PROVIDER_SITE_OTHER): Payer: Medicare Other | Admitting: Internal Medicine

## 2019-12-26 ENCOUNTER — Encounter: Payer: Self-pay | Admitting: Physician Assistant

## 2019-12-26 ENCOUNTER — Other Ambulatory Visit: Payer: Self-pay

## 2019-12-26 VITALS — BP 140/62 | HR 91 | Temp 97.6°F | Ht 63.0 in | Wt 181.8 lb

## 2019-12-26 DIAGNOSIS — M25562 Pain in left knee: Secondary | ICD-10-CM

## 2019-12-26 DIAGNOSIS — J449 Chronic obstructive pulmonary disease, unspecified: Secondary | ICD-10-CM

## 2019-12-26 DIAGNOSIS — J9611 Chronic respiratory failure with hypoxia: Secondary | ICD-10-CM | POA: Diagnosis not present

## 2019-12-26 DIAGNOSIS — G8929 Other chronic pain: Secondary | ICD-10-CM | POA: Diagnosis not present

## 2019-12-26 DIAGNOSIS — R002 Palpitations: Secondary | ICD-10-CM

## 2019-12-26 DIAGNOSIS — E1165 Type 2 diabetes mellitus with hyperglycemia: Secondary | ICD-10-CM | POA: Diagnosis not present

## 2019-12-26 DIAGNOSIS — M25561 Pain in right knee: Secondary | ICD-10-CM | POA: Diagnosis not present

## 2019-12-26 MED ORDER — BREZTRI AEROSPHERE 160-9-4.8 MCG/ACT IN AERO: 2.0000 | INHALATION_SPRAY | Freq: Two times a day (BID) | RESPIRATORY_TRACT | 0 refills | Status: DC

## 2019-12-26 NOTE — Progress Notes (Signed)
Office Visit Note   Patient: [name]           Date of Birth: 20-May-1935           MRN: [id number] Visit Date: 12/26/2019              Requested by: [name], MD [place] [address],  [place] [address] PCP: [name], MD  No chief complaint on file.     HPI: This is a pleasant woman with multiple medical comorbidities who follows up for her bilateral knee arthritis.  Dr. Sharol Given injected her knees about a month ago.  She did get significant relief in her right knee but minimal to no relief in her left.  She also has a history of falling  Assessment & Plan: Visit Diagnoses: No diagnosis found.  Plan: I offered to inject her left knee.  She declined this saying that it has never helped before.  I also offered to set up home physical therapy so she could strengthen her legs because she is homebound for the most part.  She also declined this but and said that physical therapy has never helped before.  She would like to meet with Dr. Sharol Given and I think this is fine.    Follow-Up Instructions: No follow-ups on file.   Ortho Exam  Patient is alert, oriented, no adenopathy, well-dressed, normal affect, normal respiratory effort. Bilateral knees: No effusion no cellulitis range of motion is fairly good she has tenderness on the left knee on the lateral side.  Compartments are soft and nontender  Imaging: No results found. No images are attached to the encounter.  Labs: Lab Results  Component Value Date   HGBA1C 11.7 (A) 11/05/2019   HGBA1C 10.3 (A) 06/04/2019   HGBA1C 10.6 (A) 12/09/2018   LABURIC 4.1 11/16/2014   REPTSTATUS 11/09/2016 FINAL 11/08/2016   CULT NO GROWTH 11/08/2016   LABORGA STAPHYLOCOCCUS SPECIES (COAGULASE NEGATIVE) 05/24/2015     Lab Results  Component Value Date   ALBUMIN 4.0 08/04/2019   ALBUMIN 4.1 12/09/2018   ALBUMIN 4.1 02/06/2018   LABURIC 4.1 11/16/2014    Lab Results  Component Value Date   MG 1.6 12/20/2016   MG 1.7  10/18/2015   Lab Results  Component Value Date   VD25OH 38.42 12/09/2018   VD25OH 19.95 (L) 02/06/2018    No results found for: PREALBUMIN CBC EXTENDED Latest Ref Rng & Units 08/04/2019 11/06/2017 05/07/2017  WBC 4.0 - 10.5 K/uL 8.4 11.2(H) 8.5  RBC 3.87 - 5.11 Mil/uL 5.21(H) 4.92 5.15(H)  HGB 12.0 - 15.0 g/dL 13.7 12.8 13.5  HCT 36 - 46 % 43.3 41.1 43.0  PLT 150 - 400 K/uL 166.0 181.0 187.0  NEUTROABS 1.4 - 7.7 K/uL 3.7 6.1 4.8  LYMPHSABS 0.7 - 4.0 K/uL 3.9 4.2(H) 3.1     There is no height or weight on file to calculate BMI.  Orders:  No orders of the defined types were placed in this encounter.  No orders of the defined types were placed in this encounter.    Procedures: No procedures performed  Clinical Data: No additional findings.  ROS:  All other systems negative, except as noted in the HPI. Review of Systems  Objective: Vital Signs: There were no vitals taken for this visit.  Specialty Comments:  No specialty comments available.  PMFS History: Patient Active Problem List   Diagnosis Date Noted  . Scoliosis due to degenerative disease of spine in adult patient  09/10/2018  . Radicular pain of left lower extremity 09/10/2018  . Diarrhea 11/08/2016  . Thrombocytopenia ([place]) 11/08/2016  . Degenerative lumbar spinal stenosis 10/27/2015  . Arthralgia 09/22/2015  . Chronic respiratory failure with hypoxia ([place]) 01/11/2015  . Diabetic polyneuropathy associated with type 2 diabetes mellitus ([place]) 11/16/2014  . Essential hypertension, benign 05/19/2013  . Vertigo 05/11/2013  . Dizziness 05/11/2013  . Tachycardia 05/11/2013  . Lower urinary tract infectious disease 05/11/2013  . Sinusitis 05/11/2013  . Other and unspecified hyperlipidemia 02/15/2013  . Type II diabetes mellitus, uncontrolled ([place]) 01/01/2013  . Benign paroxysmal positional vertigo 08/09/2012  . Type II or unspecified type diabetes mellitus with neurological manifestations,  uncontrolled(250.62) 08/09/2012  . GLAUCOMA 03/16/2010  . HOARSENESS 03/16/2010  . COUGH 12/23/2009  . CHEST PAIN 12/23/2009  . MUSCULOSKELETAL PAIN 01/28/2009  . COPD mixed type ([place]) 01/15/2009  . Seasonal and perennial allergic rhinitis 11/23/2007  . CHRONIC RHINITIS 07/16/2007  . G E R D 05/29/2007  . I B S-DIARRHEAL PREDOMINATE 05/29/2007  . FATTY LIVER DISEASE 05/29/2007   Past Medical History:  Diagnosis Date  . Allergic rhinitis, cause unspecified    Sinus CT Rec 12-23-2009  . Arthritis   . Benign paroxysmal positional vertigo 08/09/2012  . 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 Complication of anesthesia    takes along time to wake up  . Diabetes mellitus   . Diarrhea   . Diverticulosis   . Esophageal reflux   . Esophageal stricture   . GERD (gastroesophageal reflux disease)   . Glaucoma   . Hiatal hernia   . Hypertension   . Irritable bowel syndrome   . Neuropathy in diabetes ([place]) 08/09/2012  . Other chronic nonalcoholic liver disease   . Oxygen deficiency   . Sciatica   . Scoliosis   . Shingles 2010  . Shortness of breath   . Steatohepatitis     Family History  Problem Relation Age of Onset  . Heart disease Father   . Lung cancer Mother        small cell;Byssinosis  . Lung cancer Sister   . Liver cancer Sister        ? mets from another area of the body  . Diabetes Other        grandmother  . Stroke Maternal Grandfather     Past Surgical History:  Procedure Laterality Date  . APPENDECTOMY    . CARPAL TUNNEL RELEASE     right hand  . CHOLECYSTECTOMY OPEN  1978  . COLONOSCOPY  07-2001   mild diverticulosis  . ESOPHAGOGASTRODUODENOSCOPY  1505,69-79   H Hernia,es.stricture s/p dil 39F  . INTRAOCULAR LENS INSERTION Bilateral   . LIVER BIOPSY  [phone]  . PARATHYROID EXPLORATION    . TONSILLECTOMY AND ADENOIDECTOMY    . TOTAL ABDOMINAL HYSTERECTOMY    . ULNAR NERVE TRANSPOSITION  12/07/2011    Procedure: ULNAR NERVE DECOMPRESSION/TRANSPOSITION;this was cancelled-not done  Surgeon: Cammie Sickle., MD;  Location: [facility];  Service: Orthopedics;  Laterality: Right;  right ulnar nerve in situ decompression  . ULNAR TUNNEL RELEASE  03/07/2012   Procedure: CUBITAL TUNNEL RELEASE;  Surgeon: Roseanne Kaufman, MD;  Location: [facility];  Service: Orthopedics;  Laterality: Right;  ulnar nerve release at the elbow     Social History   Occupational History  . Occupation: Retired   Tobacco Use  . Smoking status: Former Smoker  Types: Cigarettes    Quit date: 05/08/1988    Years since quitting: 31.6  . Smokeless tobacco: Never Used  Vaping Use  . Vaping Use: Never used  Substance and Sexual Activity  . Alcohol use: No    Alcohol/week: 0.0 standard drinks  . Drug use: No  . Sexual activity: Never

## 2019-12-26 NOTE — Progress Notes (Signed)
Patient ID: [name], female    DOB: [birth year], 84 y.o.   MRN: [id number]  HPI Female former smoker followed for COPD, chronic hypoxic respiratory failure, complicated by chronic rhinitis, GERD, DM, glaucoma 2012- Walk test Patient Saturations on Room Air while Ambulating = 84% Patient Saturations on 2.5 Liters of oxygen while Ambulating = 92%  PFT: 01/16/2012 severe obstructive airways disease with insignificant response to bronchodilator, air trapping, diffusion moderately reduced. FEV1 0.82/45%, FEV1/FEC 0.46. Emphysema pattern on the loop. TLC 100%, RV 148%, DLCO 47%. ---------------------------------------------------------------------  06/03/19- 84 year old female former smoker followed for COPD, chronic hypoxic respiratory failure, complicated by chronic rhinitis, GERD, DM2,  back pain/scoliosis O2 3-3.5 L/ [employer] Generally intolerant of Beta adrenergic stimulants Xopenex HFA, Stiolto 2.5 Respimat, neb Duoneb, Astelin,  Here with aide- no longer drives. Chronic bronchitis- variable cough and green sputum " comes and goes", Discussed her medication intolerance list and will wait on abx. Chronic bach ache x years- worse after a door swung against her back and knocked her down. Had xrays. CXR 05/31/2018 No active cardiopulmonary disease.  12/26/19- 84 year old female former smoker followed for COPD, chronic hypoxic respiratory failure, complicated by chronic rhinitis, GERD, DM2,  back pain/scoliosis O2 3-3.5 L/ [employer] Generally intolerant of Beta adrenergic stimulants Xopenex HFA, Stiolto 2.5 Respimat, neb Duoneb, Astelin,  -----copd,shob with exertion Patient had called c/o palpitations with dyspnea. Asking for new PCP. Reports intermittent palpitation x 3 weeks, without chest pain or dizziness, but afraid she "is going to die". . Continues to sleep with O2 Asks referal to primary care to establish.  CXR 08/06/19-  Lungs clear. Cardiac silhouette normal. Aortic  Atherosclerosis (ICD10-I70.0).  ROS-see HPI  + = positive Constitutional:   No-   weight loss, night sweats, fevers, chills, fatigue, lassitude. HEENT:   No-  headaches, difficulty swallowing, tooth/dental problems, sore throat,       No-  sneezing, itching, ear ache, nasal congestion, post nasal drip,  CV:  No-   chest pain, orthopnea, PND, swelling in lower extremities, anasarca,                                                   Dizziness,+ palpitations Resp: +shortness of breath with exertion or at rest.              + productive cough,  + non-productive cough,  No- coughing up of blood.                 change in color of mucus.   wheezing.   Skin: No-   rash or lesions. GI:  +heartburn, indigestion, No-abdominal pain, nausea, vomiting,  GU: . MS:  No-   joint pain or swelling.   + back pain Neuro-     nothing unusual Psych:  No- change in mood or affect. +depression or anxiety.  No memory loss.    Objective:  OBJ- Physical Exam    + Very talkative General- Alert, Oriented, Affect-appropriate, Distress- none acute, + overweight,  Skin- rash-none, lesions- none, excoriation- none Lymphadenopathy- none Head- atraumatic            Eyes- Gross vision intact, PERRLA, conjunctivae and secretions clear            Ears- Hearing, canals-normal            Nose- Clear, no-Septal  dev, mucus, polyps, erosion, perforation             Throat- Mallampati IV , mucosa -no thrush , drainage- none, tonsils- atrophic,                                      stridor-none,  + missing teeth Neck- flexible , trachea midline, no stridor , thyroid nl, carotid no bruit Chest - symmetrical excursion , unlabored           Heart/CV- RRR , no murmur , no gallop  , no rub, nl s1 s2                           - JVD- none , edema- none, stasis changes- none, varices- none           Lung-  + clear, unlabored, wheeze-none, cough- none while here , dullness-none, rub- none           Chest wall-  Abd-  Br/ Gen/ Rectal-  Not done, not indicated Extrem- cyanosis- none, clubbing, none, atrophy- none, strength- , + wheelchair Neuro- grossly intact to observation

## 2019-12-26 NOTE — Patient Instructions (Signed)
Order- referral to Cardiology- dx palpitation  Order- referral to Primary Care to establish   Dx Diabetes type 2  Please call if we can help

## 2019-12-29 ENCOUNTER — Telehealth: Payer: Self-pay | Admitting: Internal Medicine

## 2019-12-29 NOTE — Telephone Encounter (Signed)
Spoke with patient, she would like to see if there was another cardiologist that could see her before the end of September, she is having more spells and wants to be seen sooner.  She said she is afraid to take the [facility] because of what she read about it and the side effects.  She wants to know if there is something else she can try without the potential side effects.  PCCs:  Please see if there is another cardiologist that can see her sooner.  Thank you.  Dr. Annamaria Boots:  Is there another inhaler without so many potential side effects.

## 2019-12-29 NOTE — Telephone Encounter (Signed)
Offer Rx Spiriva 2.5   inhale 2 puffs, once daily, ref x 1

## 2019-12-30 ENCOUNTER — Telehealth: Payer: Self-pay | Admitting: Internal Medicine

## 2019-12-30 MED ORDER — LEVALBUTEROL TARTRATE 45 MCG/ACT IN AERO: 1.0000 | INHALATION_SPRAY | Freq: Four times a day (QID) | RESPIRATORY_TRACT | 12 refills | Status: DC | PRN

## 2019-12-30 NOTE — Telephone Encounter (Signed)
Called spoke with patient.  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 She wants something to help her in an emergency   Patient states xopenex was light blue with a maroon cap, from diagram of inhalers in office this is xopenex, but this is very expensive and may need a PA for her to get it.   But wants something like the xopenex inhaler.   Had a very bad shortness of breath episode this am  Dr. Annamaria Boots please advise.   Allergies  Allergen Reactions  . Chlordiazepoxide-Clidinium Other (See Comments)    Sleepy,weak  . Biaxin [Clarithromycin] Other (See Comments)    Foul taste, abd pain, diarrhea  . Tramadol Other (See Comments)    Caused tremors  . Codeine Other (See Comments)    REACTION: gi upset- only in high doses per pt.  Can tolerate cough syrup with codeine.    Current Outpatient Medications on File Prior to Visit  Medication Sig Dispense Refill  . ACCU-CHEK SOFTCLIX LANCETS lancets 1 each by Other route daily. Use as instructed to check blood sugar 8 times daily. 250 each 3  . albuterol (VENTOLIN HFA) 108 (90 Base) MCG/ACT inhaler Inhale 2 puffs into the lungs every 6 (six) hours as needed for wheezing or shortness of breath. (Patient not taking: Reported on 12/26/2019) 8 g 12  . ALPRAZolam (XANAX) 0.5 MG tablet TAKE 1/2 TO 1 TABLET TWICE DAILY AS NEEDED FOR ANXIETY. 60 tablet 0  . aspirin 325 MG tablet Take 325 mg by mouth daily.      Marland Kitchen azelastine (ASTELIN) 0.1 % nasal spray Place 1 spray into both nostrils 2 (two) times daily. Use in each nostril as directed 30 mL 1  . B-D UF III MINI PEN NEEDLES 31G X 5 MM MISC USE 5 PER DAY TO INJECT INSULIN 200 each 3  . Budeson-Glycopyrrol-Formoterol (BREZTRI AEROSPHERE) 160-9-4.8 MCG/ACT AERO Inhale 2 puffs into the lungs 2 (two)  times daily. 5.9 g 0  . celecoxib (CELEBREX) 100 MG capsule Take 1 capsule (100 mg total) by mouth daily. With food 30 capsule 1  . cholestyramine (QUESTRAN) 4 g packet 1 scoop/pack twice a day as needed. (Patient not taking: Reported on 12/26/2019) 60 each 3  . dicyclomine (BENTYL) 10 MG capsule TAKE (1) CAPSULE TWICE DAILY. (Patient not taking: Reported on 12/26/2019) 60 capsule 0  . glucose blood (ACCU-CHEK AVIVA PLUS) test strip Use as instructed to check blood sugar 4 times a day. DX:E11.65 200 strip 1  . insulin aspart (NOVOLOG FLEXPEN) 100 UNIT/ML FlexPen Inject 12-14 units under the skin at breakfast 14-16 at lunch, and 12-16 at dinner. 15 mL 2  . insulin degludec (TRESIBA FLEXTOUCH) 200 UNIT/ML FlexTouch Pen Inject 58 Units into the skin daily. 15 mL 1  . Insulin Pen Needle (B-D ULTRAFINE III SHORT PEN) 31G X 8 MM MISC TEST 3 TIMES A DAY 100 each 5  . Insulin Syringe-Needle U-100 (BD INSULIN SYRINGE ULTRAFINE) 31G X 15/64" 0.5 ML MISC Use 2 per day dx code E11.65 100 each 5  . ipratropium-albuterol (DUONEB) 0.5-2.5 (3) MG/3ML SOLN 1 vial in neb every 8 hours and as needed 90 mL 11  . irbesartan (AVAPRO) 150 MG tablet Take 1 tablet (150 mg total) by mouth daily.  30 tablet 5  . Lancets Misc. (ACCU-CHEK SOFTCLIX LANCET DEV) KIT 1 each by Does not apply route daily. Use as instructed to check blood sugar 8 times daily. 1 kit 0  . magic mouthwash SOLN Swish with 2 teaspoonfuls by mouth as needed 240 mL 0  . meclizine (ANTIVERT) 25 MG tablet take 1 tablet by mouth three times a day if needed for dizziness/vertigo (Patient not taking: Reported on 12/26/2019) 30 tablet 3  . methenamine (MANDELAMINE) 0.5 GM tablet     . metoCLOPramide (REGLAN) 5 MG tablet TAKE  (1)  TABLET  FOUR TIMES DAILY. (Patient not taking: Reported on 12/26/2019) 90 tablet 0  . metoprolol succinate (TOPROL-XL) 25 MG 24 hr tablet TAKE 1 TABLET ONCE DAILY. 30 tablet 0  . mupirocin ointment (BACTROBAN) 2 % Apply 1 application  topically 2 (two) times daily. Apply to the affected area 2 times a day (Patient not taking: Reported on 12/26/2019) 22 g 3  . omeprazole (PRILOSEC) 20 MG capsule TAKE (1) CAPSULE DAILY. 30 capsule 0  . OXYGEN Inhale 3 L into the lungs continuous.     . pravastatin (PRAVACHOL) 20 MG tablet TAKE 1 TABLET ONCE DAILY. 90 tablet 3  . predniSONE (DELTASONE) 10 MG tablet Take 1 tablet (10 mg total) by mouth daily with breakfast. (Patient not taking: Reported on 12/26/2019) 30 tablet 0  . sodium chloride HYPERTONIC 3 % nebulizer solution 1 ampule neb every 6 hours if needed to clear mucus 75 mL 12  . Spacer/Aero-Holding Chambers (AEROCHAMBER MV) inhaler Use as instructed 1 each 0  . STIOLTO RESPIMAT 2.5-2.5 MCG/ACT AERS USE 2 PUFFS DAILY 4 g 4  . Vitamin D, Ergocalciferol, (DRISDOL) 1.25 MG (50000 UT) CAPS capsule Take 1 capsule (50,000 Units total) by mouth once a week. (Patient not taking: Reported on 12/26/2019) 4 capsule 3   No current facility-administered medications on file prior to visit.

## 2019-12-30 NOTE — Telephone Encounter (Signed)
Ok to order generic xopenex hfa   # 1 inhale 1-2 puffs eevery 6 hours if needed, refill x 12

## 2019-12-30 NOTE — Telephone Encounter (Signed)
Called spoke with patient  Verified pharmacy  Order placed Nothing further needed at this time.

## 2019-12-30 NOTE — Telephone Encounter (Signed)
ATC patient unable to reach phone rang busy will try again

## 2019-12-30 NOTE — Telephone Encounter (Signed)
Attempted to call patient, line rings busy. She does not have Xopenex nebs on her profile, she has Duoneb, need clarification.

## 2020-01-02 ENCOUNTER — Encounter: Payer: Self-pay | Admitting: General Practice

## 2020-01-05 ENCOUNTER — Other Ambulatory Visit: Payer: Self-pay | Admitting: Endocrinology

## 2020-01-05 ENCOUNTER — Telehealth: Payer: Self-pay | Admitting: Internal Medicine

## 2020-01-05 NOTE — Telephone Encounter (Signed)
Spoke with patient regarding prior message.Patient stated she ate some lunch and put her o2 up to 2 liters. Patient stated she is not having SOB anymore. Advised patient she can use her Xopenex every 6 hours PRN for wheezing Advised patient if she has chest pains she can go to the ED.Patient stated she missed the cardiologist call. I gave patient the number. Patient's voice was understanding. Nothing else further needed.

## 2020-01-06 NOTE — Telephone Encounter (Signed)
Called and spoke to pt. Medication was already called into pharmacy and pt has since received it. Pt denied needing anything further. Will sign off.

## 2020-01-12 DIAGNOSIS — R002 Palpitations: Secondary | ICD-10-CM | POA: Insufficient documentation

## 2020-01-12 NOTE — Assessment & Plan Note (Signed)
She continues to benefit from O2 at night.

## 2020-01-12 NOTE — Assessment & Plan Note (Signed)
Pulse is regular at this visit but she may be describing PAFib or similar. Plan- cardiology referral. Also asking PCCs to see if we can help her establish with primary care.

## 2020-01-12 NOTE — Assessment & Plan Note (Signed)
Currently stable and comfortable without exacerbation. Meds appropriate.

## 2020-01-13 ENCOUNTER — Other Ambulatory Visit: Payer: Self-pay

## 2020-01-13 ENCOUNTER — Emergency Department (HOSPITAL_COMMUNITY): Payer: Medicare Other

## 2020-01-13 ENCOUNTER — Inpatient Hospital Stay (HOSPITAL_COMMUNITY)
Admission: EM | Admit: 2020-01-13 | Discharge: 2020-01-16 | DRG: 242 | Disposition: A | Discharge status: Home / Self Care | Payer: Medicare Other | Attending: Family Medicine | Admitting: Family Medicine

## 2020-01-13 ENCOUNTER — Encounter (HOSPITAL_COMMUNITY): Payer: Self-pay

## 2020-01-13 DIAGNOSIS — F419 Anxiety disorder, unspecified: Secondary | ICD-10-CM | POA: Diagnosis present

## 2020-01-13 DIAGNOSIS — J302 Other seasonal allergic rhinitis: Secondary | ICD-10-CM | POA: Diagnosis present

## 2020-01-13 DIAGNOSIS — E1165 Type 2 diabetes mellitus with hyperglycemia: Secondary | ICD-10-CM

## 2020-01-13 DIAGNOSIS — R54 Age-related physical debility: Secondary | ICD-10-CM | POA: Diagnosis present

## 2020-01-13 DIAGNOSIS — Z9981 Dependence on supplemental oxygen: Secondary | ICD-10-CM

## 2020-01-13 DIAGNOSIS — R404 Transient alteration of awareness: Secondary | ICD-10-CM | POA: Diagnosis not present

## 2020-01-13 DIAGNOSIS — Z950 Presence of cardiac pacemaker: Secondary | ICD-10-CM

## 2020-01-13 DIAGNOSIS — R7989 Other specified abnormal findings of blood chemistry: Secondary | ICD-10-CM | POA: Diagnosis present

## 2020-01-13 DIAGNOSIS — Z90710 Acquired absence of both cervix and uterus: Secondary | ICD-10-CM

## 2020-01-13 DIAGNOSIS — E1151 Type 2 diabetes mellitus with diabetic peripheral angiopathy without gangrene: Secondary | ICD-10-CM | POA: Diagnosis not present

## 2020-01-13 DIAGNOSIS — E559 Vitamin D deficiency, unspecified: Secondary | ICD-10-CM | POA: Diagnosis not present

## 2020-01-13 DIAGNOSIS — K219 Gastro-esophageal reflux disease without esophagitis: Secondary | ICD-10-CM | POA: Diagnosis present

## 2020-01-13 DIAGNOSIS — E785 Hyperlipidemia, unspecified: Secondary | ICD-10-CM | POA: Diagnosis present

## 2020-01-13 DIAGNOSIS — J9621 Acute and chronic respiratory failure with hypoxia: Secondary | ICD-10-CM | POA: Diagnosis present

## 2020-01-13 DIAGNOSIS — E1142 Type 2 diabetes mellitus with diabetic polyneuropathy: Secondary | ICD-10-CM | POA: Diagnosis present

## 2020-01-13 DIAGNOSIS — R778 Other specified abnormalities of plasma proteins: Secondary | ICD-10-CM | POA: Diagnosis present

## 2020-01-13 DIAGNOSIS — I442 Atrioventricular block, complete: Principal | ICD-10-CM | POA: Diagnosis present

## 2020-01-13 DIAGNOSIS — E669 Obesity, unspecified: Secondary | ICD-10-CM | POA: Diagnosis present

## 2020-01-13 DIAGNOSIS — J811 Chronic pulmonary edema: Secondary | ICD-10-CM | POA: Diagnosis not present

## 2020-01-13 DIAGNOSIS — I517 Cardiomegaly: Secondary | ICD-10-CM | POA: Diagnosis not present

## 2020-01-13 DIAGNOSIS — Z7951 Long term (current) use of inhaled steroids: Secondary | ICD-10-CM

## 2020-01-13 DIAGNOSIS — L89151 Pressure ulcer of sacral region, stage 1: Secondary | ICD-10-CM | POA: Diagnosis present

## 2020-01-13 DIAGNOSIS — L8990 Pressure ulcer of unspecified site, unspecified stage: Secondary | ICD-10-CM | POA: Insufficient documentation

## 2020-01-13 DIAGNOSIS — K760 Fatty (change of) liver, not elsewhere classified: Secondary | ICD-10-CM | POA: Diagnosis present

## 2020-01-13 DIAGNOSIS — Z79899 Other long term (current) drug therapy: Secondary | ICD-10-CM

## 2020-01-13 DIAGNOSIS — I10 Essential (primary) hypertension: Secondary | ICD-10-CM | POA: Diagnosis present

## 2020-01-13 DIAGNOSIS — J90 Pleural effusion, not elsewhere classified: Secondary | ICD-10-CM | POA: Diagnosis not present

## 2020-01-13 DIAGNOSIS — Z888 Allergy status to other drugs, medicaments and biological substances status: Secondary | ICD-10-CM

## 2020-01-13 DIAGNOSIS — Z9049 Acquired absence of other specified parts of digestive tract: Secondary | ICD-10-CM

## 2020-01-13 DIAGNOSIS — N390 Urinary tract infection, site not specified: Secondary | ICD-10-CM | POA: Diagnosis not present

## 2020-01-13 DIAGNOSIS — M25511 Pain in right shoulder: Secondary | ICD-10-CM | POA: Diagnosis not present

## 2020-01-13 DIAGNOSIS — Z7952 Long term (current) use of systemic steroids: Secondary | ICD-10-CM

## 2020-01-13 DIAGNOSIS — R001 Bradycardia, unspecified: Secondary | ICD-10-CM | POA: Diagnosis not present

## 2020-01-13 DIAGNOSIS — Z885 Allergy status to narcotic agent status: Secondary | ICD-10-CM

## 2020-01-13 DIAGNOSIS — J449 Chronic obstructive pulmonary disease, unspecified: Secondary | ICD-10-CM | POA: Diagnosis not present

## 2020-01-13 DIAGNOSIS — Z0189 Encounter for other specified special examinations: Secondary | ICD-10-CM | POA: Diagnosis present

## 2020-01-13 DIAGNOSIS — H409 Unspecified glaucoma: Secondary | ICD-10-CM | POA: Diagnosis present

## 2020-01-13 DIAGNOSIS — Z20822 Contact with and (suspected) exposure to covid-19: Secondary | ICD-10-CM | POA: Diagnosis not present

## 2020-01-13 DIAGNOSIS — Z6832 Body mass index (BMI) 32.0-32.9, adult: Secondary | ICD-10-CM

## 2020-01-13 DIAGNOSIS — E119 Type 2 diabetes mellitus without complications: Secondary | ICD-10-CM

## 2020-01-13 DIAGNOSIS — R4182 Altered mental status, unspecified: Secondary | ICD-10-CM | POA: Diagnosis not present

## 2020-01-13 DIAGNOSIS — Z008 Encounter for other general examination: Secondary | ICD-10-CM | POA: Diagnosis present

## 2020-01-13 DIAGNOSIS — Z794 Long term (current) use of insulin: Secondary | ICD-10-CM

## 2020-01-13 DIAGNOSIS — R0902 Hypoxemia: Secondary | ICD-10-CM | POA: Diagnosis not present

## 2020-01-13 DIAGNOSIS — Z881 Allergy status to other antibiotic agents status: Secondary | ICD-10-CM

## 2020-01-13 DIAGNOSIS — G9341 Metabolic encephalopathy: Secondary | ICD-10-CM | POA: Diagnosis not present

## 2020-01-13 DIAGNOSIS — K589 Irritable bowel syndrome without diarrhea: Secondary | ICD-10-CM | POA: Diagnosis present

## 2020-01-13 DIAGNOSIS — Z87891 Personal history of nicotine dependence: Secondary | ICD-10-CM

## 2020-01-13 DIAGNOSIS — Z833 Family history of diabetes mellitus: Secondary | ICD-10-CM

## 2020-01-13 DIAGNOSIS — R0602 Shortness of breath: Secondary | ICD-10-CM | POA: Diagnosis not present

## 2020-01-13 DIAGNOSIS — J9811 Atelectasis: Secondary | ICD-10-CM | POA: Diagnosis not present

## 2020-01-13 DIAGNOSIS — Z7982 Long term (current) use of aspirin: Secondary | ICD-10-CM

## 2020-01-13 LAB — GLUCOSE, CAPILLARY: Glucose-Capillary: 234 mg/dL — ABNORMAL HIGH (ref 70–99)

## 2020-01-13 LAB — URINALYSIS, ROUTINE W REFLEX MICROSCOPIC
Bilirubin Urine: NEGATIVE
Glucose, UA: >=500 mg/dL — AB
Ketones, ur: 20 mg/dL — AB
Nitrite: NEGATIVE
Protein, ur: 100 mg/dL — AB
RBC / HPF: >50 RBC/hpf — ABNORMAL HIGH (ref 0–5)
Specific Gravity, Urine: 1.023 (ref 1.005–1.030)
WBC, UA: >50 WBC/hpf — ABNORMAL HIGH (ref 0–5)
pH: 5 (ref 5.0–8.0)

## 2020-01-13 LAB — COMPREHENSIVE METABOLIC PANEL
ALT: 15 U/L (ref 0–44)
AST: 20 U/L (ref 15–41)
Albumin: 3.4 g/dL — ABNORMAL LOW (ref 3.5–5.0)
Alkaline Phosphatase: 50 U/L (ref 38–126)
Anion gap: 10 (ref 5–15)
BUN: 15 mg/dL (ref 8–23)
CO2: 28 mmol/L (ref 22–32)
Calcium: 9 mg/dL (ref 8.9–10.3)
Chloride: 96 mmol/L — ABNORMAL LOW (ref 98–111)
Creatinine, Ser: 0.78 mg/dL (ref 0.44–1.00)
GFR calc Af Amer: >60 mL/min (ref >60)
GFR calc non Af Amer: >60 mL/min (ref >60)
Glucose, Bld: 323 mg/dL — ABNORMAL HIGH (ref 70–99)
Potassium: 4.8 mmol/L (ref 3.5–5.1)
Sodium: 134 mmol/L — ABNORMAL LOW (ref 135–145)
Total Bilirubin: 0.8 mg/dL (ref 0.3–1.2)
Total Protein: 7.1 g/dL (ref 6.5–8.1)

## 2020-01-13 LAB — I-STAT ARTERIAL BLOOD GAS, ED
Acid-Base Excess: 3 mmol/L — ABNORMAL HIGH (ref 0.0–2.0)
Bicarbonate: 28.9 mmol/L — ABNORMAL HIGH (ref 20.0–28.0)
Calcium, Ion: 1.2 mmol/L (ref 1.15–1.40)
HCT: 41 % (ref 36.0–46.0)
Hemoglobin: 13.9 g/dL (ref 12.0–15.0)
O2 Saturation: 94 %
Patient temperature: 99.6
Potassium: 4.7 mmol/L (ref 3.5–5.1)
Sodium: 134 mmol/L — ABNORMAL LOW (ref 135–145)
TCO2: 30 mmol/L (ref 22–32)
pCO2 arterial: 47.8 mmHg (ref 32.0–48.0)
pH, Arterial: 7.392 (ref 7.350–7.450)
pO2, Arterial: 73 mmHg — ABNORMAL LOW (ref 83.0–108.0)

## 2020-01-13 LAB — CBC WITH DIFFERENTIAL/PLATELET
Abs Immature Granulocytes: 0.06 10*3/uL (ref 0.00–0.07)
Basophils Absolute: 0.1 10*3/uL (ref 0.0–0.1)
Basophils Relative: 1 %
Eosinophils Absolute: 0 10*3/uL (ref 0.0–0.5)
Eosinophils Relative: 0 %
HCT: 41 % (ref 36.0–46.0)
Hemoglobin: 12.5 g/dL (ref 12.0–15.0)
Immature Granulocytes: 1 %
Lymphocytes Relative: 14 %
Lymphs Abs: 1.4 10*3/uL (ref 0.7–4.0)
MCH: 26.3 pg (ref 26.0–34.0)
MCHC: 30.5 g/dL (ref 30.0–36.0)
MCV: 86.3 fL (ref 80.0–100.0)
Monocytes Absolute: 0.5 10*3/uL (ref 0.1–1.0)
Monocytes Relative: 5 %
Neutro Abs: 8 10*3/uL — ABNORMAL HIGH (ref 1.7–7.7)
Neutrophils Relative %: 79 %
Platelets: 167 10*3/uL (ref 150–400)
RBC: 4.75 MIL/uL (ref 3.87–5.11)
RDW: 15.1 % (ref 11.5–15.5)
WBC: 10 10*3/uL (ref 4.0–10.5)
nRBC: 0 % (ref 0.0–0.2)

## 2020-01-13 LAB — CBC
HCT: 44.1 % (ref 36.0–46.0)
Hemoglobin: 13.6 g/dL (ref 12.0–15.0)
MCH: 26.2 pg (ref 26.0–34.0)
MCHC: 30.8 g/dL (ref 30.0–36.0)
MCV: 85 fL (ref 80.0–100.0)
Platelets: UNDETERMINED 10*3/uL (ref 150–400)
RBC: 5.19 MIL/uL — ABNORMAL HIGH (ref 3.87–5.11)
RDW: 15.2 % (ref 11.5–15.5)
WBC: 10.2 10*3/uL (ref 4.0–10.5)
nRBC: 0 % (ref 0.0–0.2)

## 2020-01-13 LAB — PHOSPHORUS: Phosphorus: 3.4 mg/dL (ref 2.5–4.6)

## 2020-01-13 LAB — CBG MONITORING, ED
Glucose-Capillary: 298 mg/dL — ABNORMAL HIGH (ref 70–99)
Glucose-Capillary: 296 mg/dL — ABNORMAL HIGH (ref 70–99)

## 2020-01-13 LAB — TROPONIN I (HIGH SENSITIVITY)
Troponin I (High Sensitivity): 312 ng/L (ref <18)
Troponin I (High Sensitivity): 356 ng/L (ref <18)

## 2020-01-13 LAB — ECHOCARDIOGRAM COMPLETE
Height: 63 in
S' Lateral: 2.8 cm
Weight: 2910.07 oz

## 2020-01-13 LAB — TSH: TSH: 1.97 u[IU]/mL (ref 0.350–4.500)

## 2020-01-13 LAB — CREATININE, SERUM
Creatinine, Ser: 0.82 mg/dL (ref 0.44–1.00)
GFR calc Af Amer: >60 mL/min (ref >60)
GFR calc non Af Amer: >60 mL/min (ref >60)

## 2020-01-13 LAB — SARS CORONAVIRUS 2 BY RT PCR (HOSPITAL ORDER, PERFORMED IN ~~LOC~~ HOSPITAL LAB): SARS Coronavirus 2: NEGATIVE

## 2020-01-13 LAB — LACTIC ACID, PLASMA: Lactic Acid, Venous: 1.9 mmol/L (ref 0.5–1.9)

## 2020-01-13 LAB — MAGNESIUM: Magnesium: 1.7 mg/dL (ref 1.7–2.4)

## 2020-01-13 MED ORDER — ONDANSETRON HCL 4 MG PO TABS: 4.0000 mg | ORAL_TABLET | Freq: Four times a day (QID) | ORAL | Status: DC | PRN

## 2020-01-13 MED ORDER — ACETAMINOPHEN 325 MG PO TABS
650.0000 mg | ORAL_TABLET | Freq: Four times a day (QID) | ORAL | Status: DC | PRN
  Administered 2020-01-14: 650 mg via ORAL
  Filled 2020-01-13: qty 2

## 2020-01-13 MED ORDER — INSULIN ASPART 100 UNIT/ML ~~LOC~~ SOLN
0.0000 [IU] | Freq: Three times a day (TID) | SUBCUTANEOUS | Status: DC
  Administered 2020-01-13: 8 [IU] via SUBCUTANEOUS
  Administered 2020-01-14: 3 [IU] via SUBCUTANEOUS
  Administered 2020-01-14: 5 [IU] via SUBCUTANEOUS
  Administered 2020-01-14 – 2020-01-15 (×3): 3 [IU] via SUBCUTANEOUS
  Administered 2020-01-16: 5 [IU] via SUBCUTANEOUS
  Administered 2020-01-16: 3 [IU] via SUBCUTANEOUS

## 2020-01-13 MED ORDER — HYDRALAZINE HCL 20 MG/ML IJ SOLN
10.0000 mg | Freq: Once | INTRAMUSCULAR | Status: AC
  Administered 2020-01-13: 10 mg via INTRAVENOUS
  Filled 2020-01-13: qty 1

## 2020-01-13 MED ORDER — SODIUM CHLORIDE 0.9 % IV SOLN
1.0000 g | Freq: Once | INTRAVENOUS | Status: AC
  Administered 2020-01-13: 1 g via INTRAVENOUS
  Filled 2020-01-13: qty 10

## 2020-01-13 MED ORDER — ONDANSETRON HCL 4 MG/2ML IJ SOLN: 4.0000 mg | Freq: Four times a day (QID) | INTRAMUSCULAR | Status: DC | PRN

## 2020-01-13 MED ORDER — ACETAMINOPHEN 650 MG RE SUPP: 650.0000 mg | Freq: Four times a day (QID) | RECTAL | Status: DC | PRN

## 2020-01-13 MED ORDER — ENOXAPARIN SODIUM 40 MG/0.4ML ~~LOC~~ SOLN
40.0000 mg | SUBCUTANEOUS | Status: DC
  Administered 2020-01-13: 40 mg via SUBCUTANEOUS
  Filled 2020-01-13: qty 0.4

## 2020-01-13 MED ORDER — INSULIN ASPART 100 UNIT/ML ~~LOC~~ SOLN
0.0000 [IU] | Freq: Every day | SUBCUTANEOUS | Status: DC
  Administered 2020-01-13 – 2020-01-15 (×2): 2 [IU] via SUBCUTANEOUS

## 2020-01-13 NOTE — Progress Notes (Signed)
   01/13/20 2022  Vitals  Temp 99 F (37.2 C)  Temp Source Oral  BP (!) 189/45  MAP (mmHg) 87  BP Location Left Arm  BP Method Automatic  Patient Position (if appropriate) Lying  Pulse Rate (!) 46  Resp 20  MEWS COLOR  MEWS Score Color Green  Oxygen Therapy  SpO2 97 %  MEWS Score  MEWS Temp 0  MEWS Systolic 0  MEWS Pulse 1  MEWS RR 0  MEWS LOC 0  MEWS Score 1  Pt arrived to [facility] from ED. AXOX2. Placed in low bed. Fall and seizure precautions in place. Stage 1 pressure ulcer noted on sacrum.  Md notified of BP readings. Will continue to monitor.

## 2020-01-13 NOTE — Progress Notes (Signed)
SLP Cancellation Note  Patient Details Name: [name] MRN: [id number] DOB: 03-24-1936   Cancelled treatment:       Reason Eval/Treat Not Completed: Other (comment). Unable to complete BSE at this time, as pt is currently NPO for possible procedure. Will continue efforts. RN aware.  Kalanie Fewell B. Quentin Ore, [facility], [place] Speech Language Pathologist Office: [phone]  Shonna Chock 01/13/2020, 3:12 PM

## 2020-01-13 NOTE — Progress Notes (Signed)
  Echocardiogram 2D Echocardiogram has been performed.  [name] 01/13/2020, 3:32 PM

## 2020-01-13 NOTE — Progress Notes (Signed)
Was able to get in touch  with listed contact. Plan of care discussed. All questions answered.

## 2020-01-13 NOTE — H&P (Signed)
History and Physical    [name] RJJ:[id number] DOB: September 03, 1935 DOA: 01/13/2020  PCP: [name], MD  Patient coming from: home  I have personally briefly reviewed patient's old medical records in [place]  Chief Complaint: Confusion  HPI: [name] is a 84 y.o. female with medical history significant of hypertension, hyperlipidemia, diabetes mellitus, chronic hypoxemic respiratory failure secondary to underlying COPD-on 2.5 to 3 L of oxygen at home, obesity, GERD, diabetic neuropathy anxiety presents to emergency department with confusion.  Apparently patient was not feeling well therefore she called EMS and was found to be hypoxic on 70% on room air and EMS found that her tank was empty, they put her on 4 L of oxygen and her oxygen saturation improved.  They also noted patient was bradycardic and EKG shows third-degree AV block.  Patient tells me that she does not remember anything, she is alert and oriented x4 upon my evaluation however she tells me that she have to think a lot to answer simple questions.  She lives alone at home.  She denies headache, blurry vision, lightheadedness, dizziness, head trauma, seizures, chest pain, palpitation, leg swelling, nausea, vomiting, fever, chills, cough, congestion, abdominal pain.  Patient tells me that she had cystitis couple of months ago however she denies current dysuria, hematuria, lower back pain, lower abdominal pain, fever or chills.  She has chronic shortness of breath due to underlying COPD.  Denies smoking, alcohol, listed drug use.  ED Course: Upon arrival to ED: Patient bradycardic in 40s, blood pressure elevated, requiring 4 L of oxygen via nasal cannula, initial labs including CBC, lactic acid, CMP: WNL, chest x-ray and CT head came back negative for acute findings.  UA and COVID-19: Pending.  EKG shows complete heart block, troponin: 312, EDP consulted cardiology.  [facility] hospitalist consulted for admission due to  confusion likely secondary to arrhythmias.  Review of Systems: As per HPI otherwise negative.    Past Medical History:  Diagnosis Date  . Allergic rhinitis, cause unspecified    Sinus CT Rec 12-23-2009  . Arthritis   . Benign paroxysmal positional vertigo 08/09/2012  . 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 Complication of anesthesia    takes along time to wake up  . Diabetes mellitus   . Diarrhea   . Diverticulosis   . Esophageal reflux   . Esophageal stricture   . GERD (gastroesophageal reflux disease)   . Glaucoma   . Hiatal hernia   . Hypertension   . Irritable bowel syndrome   . Neuropathy in diabetes ([place]) 08/09/2012  . Other chronic nonalcoholic liver disease   . Oxygen deficiency   . Sciatica   . Scoliosis   . Shingles 2010  . Shortness of breath   . Steatohepatitis     Past Surgical History:  Procedure Laterality Date  . APPENDECTOMY    . CARPAL TUNNEL RELEASE     right hand  . CHOLECYSTECTOMY OPEN  1978  . COLONOSCOPY  07-2001   mild diverticulosis  . ESOPHAGOGASTRODUODENOSCOPY  0160,10-93   H Hernia,es.stricture s/p dil 81F  . INTRAOCULAR LENS INSERTION Bilateral   . LIVER BIOPSY  [phone]  . PARATHYROID EXPLORATION    . TONSILLECTOMY AND ADENOIDECTOMY    . TOTAL ABDOMINAL HYSTERECTOMY    . ULNAR NERVE TRANSPOSITION  12/07/2011   Procedure: ULNAR NERVE DECOMPRESSION/TRANSPOSITION;this was cancelled-not done  Surgeon: Cammie Sickle., MD;  Location: [facility];  Service: Orthopedics;  Laterality: Right;  right ulnar nerve in situ decompression  . ULNAR TUNNEL RELEASE  03/07/2012   Procedure: CUBITAL TUNNEL RELEASE;  Surgeon: Roseanne Kaufman, MD;  Location: [facility];  Service: Orthopedics;  Laterality: Right;  ulnar nerve release at the elbow       reports that she quit smoking about 31 years ago. Her smoking use included cigarettes. She has never used smokeless tobacco.  She reports that she does not drink alcohol and does not use drugs.  Allergies  Allergen Reactions  . Chlordiazepoxide-Clidinium Other (See Comments)    Sleepy,weak  . Biaxin [Clarithromycin] Other (See Comments)    Foul taste, abd pain, diarrhea  . Tramadol Other (See Comments)    Caused tremors  . Codeine Other (See Comments)    REACTION: gi upset- only in high doses per pt.  Can tolerate cough syrup with codeine.     Family History  Problem Relation Age of Onset  . Heart disease Father   . Lung cancer Mother        small cell;Byssinosis  . Lung cancer Sister   . Liver cancer Sister        ? mets from another area of the body  . Diabetes Other        grandmother  . Stroke Maternal Grandfather     Prior to Admission medications   Medication Sig Start Date End Date Taking? Authorizing Provider  ACCU-CHEK SOFTCLIX LANCETS lancets 1 each by Other route daily. Use as instructed to check blood sugar 8 times daily. 05/09/18   [name], MD  albuterol (VENTOLIN HFA) 108 (90 Base) MCG/ACT inhaler Inhale 2 puffs into the lungs every 6 (six) hours as needed for wheezing or shortness of breath. Patient not taking: Reported on 12/26/2019 06/23/19   Deneise Lever, MD  ALPRAZolam Duanne Moron) 0.5 MG tablet TAKE 1/2 TO 1 TABLET TWICE DAILY AS NEEDED FOR ANXIETY. 09/02/19   [name], MD  aspirin 325 MG tablet Take 325 mg by mouth daily.      [provider]  azelastine (ASTELIN) 0.1 % nasal spray Place 1 spray into both nostrils 2 (two) times daily. Use in each nostril as directed 04/08/19   Martyn Ehrich, NP  B-D UF III MINI PEN NEEDLES 31G X 5 MM MISC USE 5 PER DAY TO INJECT INSULIN 12/07/17   [name], MD  Budeson-Glycopyrrol-Formoterol (BREZTRI AEROSPHERE) 160-9-4.8 MCG/ACT AERO Inhale 2 puffs into the lungs 2 (two) times daily. 12/26/19   Baird Lyons D, MD  celecoxib (CELEBREX) 100 MG capsule Take 1 capsule (100 mg total) by mouth daily. With food 11/05/19   [name], MD    cholestyramine Lucrezia Starch) 4 g packet 1 scoop/pack twice a day as needed. Patient not taking: Reported on 12/26/2019 02/12/18   [name], MD  dicyclomine (BENTYL) 10 MG capsule TAKE (1) CAPSULE TWICE DAILY. Patient not taking: Reported on 12/26/2019 10/27/19   Ladene Artist, MD  glucose blood (ACCU-CHEK AVIVA PLUS) test strip Use as instructed to check blood sugar 4 times a day. DX:E11.65 08/14/19   [name], MD  insulin aspart (NOVOLOG FLEXPEN) 100 UNIT/ML FlexPen Inject 12-14 units under the skin at breakfast 14-16 at lunch, and 12-16 at dinner. 10/03/19   [name], MD  insulin degludec (TRESIBA FLEXTOUCH) 200 UNIT/ML FlexTouch Pen Inject 58 Units into the skin daily. 10/03/19   [name], MD  Insulin Pen Needle (B-D ULTRAFINE III SHORT PEN)  31G X 8 MM MISC TEST 3 TIMES A DAY 12/05/17   [name], MD  Insulin Syringe-Needle U-100 (BD INSULIN SYRINGE ULTRAFINE) 31G X 15/64" 0.5 ML MISC Use 2 per day dx code E11.65 07/21/15   [name], MD  ipratropium-albuterol (DUONEB) 0.5-2.5 (3) MG/3ML SOLN 1 vial in neb every 8 hours and as needed 03/01/18   Baird Lyons D, MD  irbesartan (AVAPRO) 150 MG tablet Take 1 tablet (150 mg total) by mouth daily. 11/05/19   [name], MD  Lancets Misc. (ACCU-CHEK SOFTCLIX LANCET DEV) KIT 1 each by Does not apply route daily. Use as instructed to check blood sugar 8 times daily. 05/09/18   [name], MD  levalbuterol [facility] HFA) 45 MCG/ACT inhaler Inhale 1-2 puffs into the lungs every 6 (six) hours as needed for wheezing. 12/30/19   Deneise Lever, MD  magic mouthwash SOLN Swish with 2 teaspoonfuls by mouth as needed 12/03/19   [name], MD  meclizine (ANTIVERT) 25 MG tablet take 1 tablet by mouth three times a day if needed for dizziness/vertigo Patient not taking: Reported on 12/26/2019 11/27/17   [name], MD  methenamine (MANDELAMINE) 0.5 GM tablet  01/21/18   [provider]  metoCLOPramide (REGLAN) 5 MG tablet TAKE  (1)  TABLET  FOUR  TIMES DAILY. Patient not taking: Reported on 12/26/2019 02/10/19   [name], MD  metoprolol succinate (TOPROL-XL) 25 MG 24 hr tablet TAKE 1 TABLET ONCE DAILY. 12/10/19   [name], MD  mupirocin ointment (BACTROBAN) 2 % Apply 1 application topically 2 (two) times daily. Apply to the affected area 2 times a day Patient not taking: Reported on 12/26/2019 02/13/19   Newt Minion, MD  omeprazole (PRILOSEC) 20 MG capsule TAKE (1) CAPSULE DAILY. 12/10/19   [name], MD  OXYGEN Inhale 3 L into the lungs continuous.     [provider]  pravastatin (PRAVACHOL) 20 MG tablet Take 1 tablet (20 mg total) by mouth daily. 01/05/20   [name], MD  predniSONE (DELTASONE) 10 MG tablet Take 1 tablet (10 mg total) by mouth daily with breakfast. Patient not taking: Reported on 12/26/2019 10/27/19   Newt Minion, MD  sodium chloride HYPERTONIC 3 % nebulizer solution 1 ampule neb every 6 hours if needed to clear mucus 08/14/17   Deneise Lever, MD  Spacer/Aero-Holding Chambers (AEROCHAMBER MV) inhaler Use as instructed 08/28/17   Deneise Lever, MD  STIOLTO RESPIMAT 2.5-2.5 MCG/ACT AERS USE 2 PUFFS DAILY 11/05/19   Deneise Lever, MD  Vitamin D, Ergocalciferol, (DRISDOL) 1.25 MG (50000 UT) CAPS capsule Take 1 capsule (50,000 Units total) by mouth once a week. Patient not taking: Reported on 12/26/2019 09/13/18   [name], MD    Physical Exam: Vitals:   01/13/20 1200 01/13/20 1204 01/13/20 1230 01/13/20 1245  BP:  (!) 190/48 (!) 157/45 (!) 183/76  Pulse:  (!) 47 (!) 47 (!) 47  Resp:  (!) 22 (!) 27 19  Temp:  99.6 F (37.6 C)    TempSrc:  Oral    SpO2:  96% 94% 94%  Weight: 82.5 kg     Height: 5' 3"  (1.6 m)       Constitutional: NAD, calm, comfortable, on 4 L of oxygen via nasal cannula, obese, communicating well.   Eyes: PERRL, lids and conjunctivae normal ENMT: Mucous membranes are moist. Posterior pharynx clear of any exudate or lesions.Normal dentition.  Neck: normal, supple, no masses,  no thyromegaly Respiratory: clear  to auscultation bilaterally, no wheezing, no crackles. Normal respiratory effort. No accessory muscle use.  Cardiovascular: Regular rate and rhythm, no murmurs / rubs / gallops. No extremity edema. 2+ pedal pulses. No carotid bruits.  Abdomen: no tenderness, no masses palpated. No hepatosplenomegaly. Bowel sounds positive.  Musculoskeletal: no clubbing / cyanosis. No joint deformity upper and lower extremities. Good ROM, no contractures. Normal muscle tone.  Skin: no rashes, lesions, ulcers. No induration Neurologic: CN 2-12 grossly intact. Sensation intact, DTR normal. Strength 5/5 in all 4.  Psychiatric: Normal judgment and insight. Alert and oriented x 3. Normal mood.    Labs on Admission: I have personally reviewed following labs and imaging studies  CBC: Recent Labs  Lab 01/13/20 1213  WBC 10.0  NEUTROABS 8.0*  HGB 12.5  HCT 41.0  MCV 86.3  PLT 267   Basic Metabolic Panel: Recent Labs  Lab 01/13/20 1213  NA 134*  K 4.8  CL 96*  CO2 28  GLUCOSE 323*  BUN 15  CREATININE 0.78  CALCIUM 9.0   GFR: Estimated Creatinine Clearance: 55.1 mL/min (by C-G formula based on SCr of 0.78 mg/dL). Liver Function Tests: Recent Labs  Lab 01/13/20 1213  AST 20  ALT 15  ALKPHOS 50  BILITOT 0.8  PROT 7.1  ALBUMIN 3.4*   No results for input(s): LIPASE, AMYLASE in the last 168 hours. No results for input(s): AMMONIA in the last 168 hours. Coagulation Profile: No results for input(s): INR, PROTIME in the last 168 hours. Cardiac Enzymes: No results for input(s): CKTOTAL, CKMB, CKMBINDEX, TROPONINI in the last 168 hours. BNP (last 3 results) No results for input(s): PROBNP in the last 8760 hours. HbA1C: No results for input(s): HGBA1C in the last 72 hours. CBG: Recent Labs  Lab 01/13/20 1215  GLUCAP 298*   Lipid Profile: No results for input(s): CHOL, HDL, LDLCALC, TRIG, CHOLHDL, LDLDIRECT in the last 72 hours. Thyroid Function Tests: No  results for input(s): TSH, T4TOTAL, FREET4, T3FREE, THYROIDAB in the last 72 hours. Anemia Panel: No results for input(s): VITAMINB12, FOLATE, FERRITIN, TIBC, IRON, RETICCTPCT in the last 72 hours. Urine analysis:    Component Value Date/Time   COLORURINE YELLOW 05/11/2017 1015   APPEARANCEUR CLEAR 05/11/2017 1015   LABSPEC 1.020 05/11/2017 1015   PHURINE 6.0 05/11/2017 1015   GLUCOSEU 100 (A) 05/11/2017 1015   HGBUR NEGATIVE 05/11/2017 1015   BILIRUBINUR NEGATIVE 05/11/2017 1015   BILIRUBINUR negative 01/25/2017 [place] 05/11/2017 1015   PROTEINUR trace 01/25/2017 1616   PROTEINUR NEGATIVE 11/08/2016 1435   UROBILINOGEN 0.2 05/11/2017 1015   NITRITE NEGATIVE 05/11/2017 1015   LEUKOCYTESUR TRACE (A) 05/11/2017 1015    Radiological Exams on Admission: CT Head Wo Contrast  Result Date: 01/13/2020 CLINICAL DATA:  Altered mental status EXAM: CT HEAD WITHOUT CONTRAST TECHNIQUE: Contiguous axial images were obtained from the base of the skull through the vertex without intravenous contrast. COMPARISON:  May 11, 2013 FINDINGS: Brain: Mild diffuse atrophy is stable. There is no intracranial mass, hemorrhage, extra-axial fluid collection, or midline shift. There is small vessel disease in the centra semiovale bilaterally, stable. There is a prior small lacunar infarct in the head of the caudate nucleus on the right. No acute appearing infarct is demonstrable. Vascular: No appreciable hyperdense vessel. There is calcification in each carotid siphon region. Skull: The bony calvarium appears intact. Sinuses/Orbits: There is mucosal thickening in several ethmoid air cells. Orbits appear symmetric bilaterally. Evidence of previous cataract removals. Other: Visualized mastoid air  cells are clear. IMPRESSION: Stable atrophy with periventricular small vessel disease. Prior small infarct in the head of the caudate nucleus on the right. No acute infarct. No mass or hemorrhage. Foci of  arterial vascular calcification noted. There is mucosal thickening in several ethmoid air cells. Electronically Signed   By: Lowella Grip III M.D.   On: 01/13/2020 13:54   DG Chest Portable 1 View  Result Date: 01/13/2020 CLINICAL DATA:  Hypoxia EXAM: PORTABLE CHEST 1 VIEW COMPARISON:  08/06/2019 FINDINGS: Cardiac shadow is stable. Aortic calcifications are again seen. The lungs are well aerated bilaterally. Mild bibasilar atelectasis is noted. No sizable effusion is seen. No bony abnormality is noted. IMPRESSION: Mild bibasilar atelectasis.  No other focal abnormality is seen. Electronically Signed   By: Inez Catalina M.D.   On: 01/13/2020 12:21    EKG: Independently reviewed.  Complete AV block with wide QRS complex.  RBBB.  Assessment/Plan Principal Problem:   Acute metabolic encephalopathy Active Problems:   COPD mixed type (HCC)   G E R D   Hypertension   Diabetic polyneuropathy associated with type 2 diabetes mellitus (HCC)   Complete heart block (HCC)   Acute on chronic respiratory failure with hypoxia (HCC)   Diabetes (HCC)   Elevated troponin    Acute metabolic encephalopathy:  -Likely secondary to arrhythmias & Hypoxia.  Patient bradycardic upon arrival.  Reviewed EKG, initial troponin 312.  Chest x-ray and CT head negative for acute findings.  Patient denies ACS symptoms.  COVID-19: Pending, lactic acid: WNL.  UA is positive for leukocyte, WBC, RBC however patient denies any urinary symptoms. -Admit patient stepdown unit for close monitoring on telemetry. -EDP consulted cardiology-await recommendations. -Check magnesium, TSH. -Monitor heart rate closely. -We will keep her n.p.o. for now. -On fall precautions -Consult PT/OT/SLP  Complete heart block: -Reviewed home medications.  Takes metoprolol at home.  Hold for now.  Reviewed EKG. -Await cardiology recommendations -Transthoracic echo is pending.  Acute on chronic hypoxemic respiratory failure secondary to COPD:  No wheezing noted on exam.  Chest x-ray negative for infection, COVID-19 pending.  On continuous pulse ox.  We will try to wean off of oxygen as tolerated. -Continue home inhalers  Elevated troponin: Patient denies ACS symptoms. -Initial troponin312-trend troponin. -Reviewed EKG.  Asymptomatic bacteriuria: -Patient denies any urinary symptoms including dysuria, foul-smelling urine, change in urinary frequency, fever, chills, nausea, vomiting or lower back pain.  Reviewed UA result.  Patient is afebrile with no leukocytosis. -We will hold off antibiotics at this time.  Type 2 diabetes mellitus: -Started patient on sliding scale insulin.  Check A1c.  Monitor blood sugar closely.  GERD: Continue PPI  Anxiety: Continue Xanax  Unable to safely start patient's home medication as med reconciliation is pending by pharmacy.  DVT prophylaxis: Lovenox/SCD Code Status: Full code for now, does not want heroic measures however  tells me that she will think about her CODE STATUS and let us know. Family Communication: None present at bedside.  Plan of care discussed with patient in length and she verbalized understanding and agreed with it. Disposition Plan: Home/SNF in 2 to 3 days Consults called: Cardiology by EDP Admission status: Inpatient   Mckinley Jewel MD [facility]  If 7PM-7AM, please contact night-coverage [email or web address] Password [facility]  01/13/2020, 2:11 PM

## 2020-01-13 NOTE — ED Triage Notes (Signed)
Pt presents to ED via [facility] from home after she called out for not feeling well. Spo2 was 70% on home o2. However, home o2 did not appear to be on. Pt confused, unable to recall anything that occurred yesterday.   Pt lives alone. Unsure of pt baseline. She is able to state she is aware that she is confused.   Pt placed on 4lpm [place] and spo2 increased to 95%.  Third degree block on ECG- no known hx. PT unsure if she took medications.

## 2020-01-13 NOTE — ED Notes (Addendum)
Went to check on Pt. She was awake and taking her hospital gown off once again and pulled her IV out. Pt keeps saying "I don't know" when asked why she is pulling everything off, where she is, what is she watching. Pt seeming to get more confused since first arrival.

## 2020-01-13 NOTE — ED Provider Notes (Signed)
[facility] EMERGENCY DEPARTMENT Provider Note   CSN: [id number] Arrival date & time: 01/13/20  1150     History No chief complaint on file.   [name] is a 84 y.o. female.  Pt presents to the ED today with not feeling well.  Pt lives alone.  She said she does not remember what she did yesterday.  She does have severe COPD with home 02 at 2.5 L.  She said she normally does well to change out her oxygen tank.  EMS found her with O2 sats in the 70s on a tank that was empty.  They put her on 4L.  They also noted that she was bradycardic and seemed to be in 3rd degree heart block.  No hx of this in the past.  She does not know if she ate or took her meds yesterday.        Past Medical History:  Diagnosis Date  . Allergic rhinitis, cause unspecified    Sinus CT Rec 12-23-2009  . Arthritis   . Benign paroxysmal positional vertigo 08/09/2012  . 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 Complication of anesthesia    takes along time to wake up  . Diabetes mellitus   . Diarrhea   . Diverticulosis   . Esophageal reflux   . Esophageal stricture   . GERD (gastroesophageal reflux disease)   . Glaucoma   . Hiatal hernia   . Hypertension   . Irritable bowel syndrome   . Neuropathy in diabetes ([place]) 08/09/2012  . Other chronic nonalcoholic liver disease   . Oxygen deficiency   . Sciatica   . Scoliosis   . Shingles 2010  . Shortness of breath   . Steatohepatitis     Patient Active Problem List   Diagnosis Date Noted  . Complete heart block ([place])   . Acute metabolic encephalopathy   . Acute on chronic respiratory failure with hypoxia ([place])   . Diabetes ([place])   . Elevated troponin   . Palpitation 01/12/2020  . Scoliosis due to degenerative disease of spine in adult patient 09/10/2018  . Radicular pain of left lower extremity 09/10/2018  . Diarrhea 11/08/2016  . Thrombocytopenia ([place]) 11/08/2016  .  Degenerative lumbar spinal stenosis 10/27/2015  . Arthralgia 09/22/2015  . Chronic respiratory failure with hypoxia ([place]) 01/11/2015  . Diabetic polyneuropathy associated with type 2 diabetes mellitus ([place]) 11/16/2014  . Hypertension 05/19/2013  . Vertigo 05/11/2013  . Dizziness 05/11/2013  . Tachycardia 05/11/2013  . Lower urinary tract infectious disease 05/11/2013  . Sinusitis 05/11/2013  . Other and unspecified hyperlipidemia 02/15/2013  . Type II diabetes mellitus, uncontrolled ([place]) 01/01/2013  . Benign paroxysmal positional vertigo 08/09/2012  . Type II or unspecified type diabetes mellitus with neurological manifestations, uncontrolled(250.62) 08/09/2012  . GLAUCOMA 03/16/2010  . HOARSENESS 03/16/2010  . COUGH 12/23/2009  . CHEST PAIN 12/23/2009  . MUSCULOSKELETAL PAIN 01/28/2009  . COPD mixed type ([place]) 01/15/2009  . Seasonal and perennial allergic rhinitis 11/23/2007  . CHRONIC RHINITIS 07/16/2007  . G E R D 05/29/2007  . I B S-DIARRHEAL PREDOMINATE 05/29/2007  . FATTY LIVER DISEASE 05/29/2007    Past Surgical History:  Procedure Laterality Date  . APPENDECTOMY    . CARPAL TUNNEL RELEASE     right hand  . CHOLECYSTECTOMY OPEN  1978  . COLONOSCOPY  07-2001   mild diverticulosis  . ESOPHAGOGASTRODUODENOSCOPY  2202,54-27   H Hernia,es.stricture s/p  dil 84F  . INTRAOCULAR LENS INSERTION Bilateral   . LIVER BIOPSY  [phone]  . PARATHYROID EXPLORATION    . TONSILLECTOMY AND ADENOIDECTOMY    . TOTAL ABDOMINAL HYSTERECTOMY    . ULNAR NERVE TRANSPOSITION  12/07/2011   Procedure: ULNAR NERVE DECOMPRESSION/TRANSPOSITION;this was cancelled-not done  Surgeon: Cammie Sickle., MD;  Location: [facility];  Service: Orthopedics;  Laterality: Right;  right ulnar nerve in situ decompression  . ULNAR TUNNEL RELEASE  03/07/2012   Procedure: CUBITAL TUNNEL RELEASE;  Surgeon: Roseanne Kaufman, MD;  Location: [facility];  Service: Orthopedics;  Laterality:  Right;  ulnar nerve release at the elbow       OB History   No obstetric history on file.     Family History  Problem Relation Age of Onset  . Heart disease Father   . Lung cancer Mother        small cell;Byssinosis  . Lung cancer Sister   . Liver cancer Sister        ? mets from another area of the body  . Diabetes Other        grandmother  . Stroke Maternal Grandfather     Social History   Tobacco Use  . Smoking status: Former Smoker    Types: Cigarettes    Quit date: 05/08/1988    Years since quitting: 31.7  . Smokeless tobacco: Never Used  Vaping Use  . Vaping Use: Never used  Substance Use Topics  . Alcohol use: No    Alcohol/week: 0.0 standard drinks  . Drug use: No    Home Medications Prior to Admission medications   Medication Sig Start Date End Date Taking? Authorizing Provider  ACCU-CHEK SOFTCLIX LANCETS lancets 1 each by Other route daily. Use as instructed to check blood sugar 8 times daily. 05/09/18   [name], MD  albuterol (VENTOLIN HFA) 108 (90 Base) MCG/ACT inhaler Inhale 2 puffs into the lungs every 6 (six) hours as needed for wheezing or shortness of breath. Patient not taking: Reported on 12/26/2019 06/23/19   Deneise Lever, MD  ALPRAZolam Duanne Moron) 0.5 MG tablet TAKE 1/2 TO 1 TABLET TWICE DAILY AS NEEDED FOR ANXIETY. 09/02/19   [name], MD  aspirin 325 MG tablet Take 325 mg by mouth daily.      [provider]  azelastine (ASTELIN) 0.1 % nasal spray Place 1 spray into both nostrils 2 (two) times daily. Use in each nostril as directed 04/08/19   Martyn Ehrich, NP  B-D UF III MINI PEN NEEDLES 31G X 5 MM MISC USE 5 PER DAY TO INJECT INSULIN 12/07/17   [name], MD  Budeson-Glycopyrrol-Formoterol (BREZTRI AEROSPHERE) 160-9-4.8 MCG/ACT AERO Inhale 2 puffs into the lungs 2 (two) times daily. 12/26/19   Baird Lyons D, MD  celecoxib (CELEBREX) 100 MG capsule Take 1 capsule (100 mg total) by mouth daily. With food 11/05/19   [name],  MD  cholestyramine Lucrezia Starch) 4 g packet 1 scoop/pack twice a day as needed. Patient not taking: Reported on 12/26/2019 02/12/18   [name], MD  dicyclomine (BENTYL) 10 MG capsule TAKE (1) CAPSULE TWICE DAILY. Patient not taking: Reported on 12/26/2019 10/27/19   Ladene Artist, MD  glucose blood (ACCU-CHEK AVIVA PLUS) test strip Use as instructed to check blood sugar 4 times a day. DX:E11.65 08/14/19   [name], MD  insulin aspart (NOVOLOG FLEXPEN) 100 UNIT/ML FlexPen Inject 12-14 units under the skin at breakfast 14-16 at  lunch, and 12-16 at dinner. 10/03/19   [name], MD  insulin degludec (TRESIBA FLEXTOUCH) 200 UNIT/ML FlexTouch Pen Inject 58 Units into the skin daily. 10/03/19   [name], MD  Insulin Pen Needle (B-D ULTRAFINE III SHORT PEN) 31G X 8 MM MISC TEST 3 TIMES A DAY 12/05/17   [name], MD  Insulin Syringe-Needle U-100 (BD INSULIN SYRINGE ULTRAFINE) 31G X 15/64" 0.5 ML MISC Use 2 per day dx code E11.65 07/21/15   [name], MD  ipratropium-albuterol (DUONEB) 0.5-2.5 (3) MG/3ML SOLN 1 vial in neb every 8 hours and as needed 03/01/18   Baird Lyons D, MD  irbesartan (AVAPRO) 150 MG tablet Take 1 tablet (150 mg total) by mouth daily. 11/05/19   [name], MD  Lancets Misc. (ACCU-CHEK SOFTCLIX LANCET DEV) KIT 1 each by Does not apply route daily. Use as instructed to check blood sugar 8 times daily. 05/09/18   [name], MD  levalbuterol [facility] HFA) 45 MCG/ACT inhaler Inhale 1-2 puffs into the lungs every 6 (six) hours as needed for wheezing. 12/30/19   Deneise Lever, MD  magic mouthwash SOLN Swish with 2 teaspoonfuls by mouth as needed 12/03/19   [name], MD  meclizine (ANTIVERT) 25 MG tablet take 1 tablet by mouth three times a day if needed for dizziness/vertigo Patient not taking: Reported on 12/26/2019 11/27/17   [name], MD  methenamine (MANDELAMINE) 0.5 GM tablet  01/21/18   [provider]  metoCLOPramide (REGLAN) 5 MG tablet TAKE  (1)  TABLET  FOUR  TIMES DAILY. Patient not taking: Reported on 12/26/2019 02/10/19   [name], MD  metoprolol succinate (TOPROL-XL) 25 MG 24 hr tablet TAKE 1 TABLET ONCE DAILY. 12/10/19   [name], MD  mupirocin ointment (BACTROBAN) 2 % Apply 1 application topically 2 (two) times daily. Apply to the affected area 2 times a day Patient not taking: Reported on 12/26/2019 02/13/19   Newt Minion, MD  omeprazole (PRILOSEC) 20 MG capsule TAKE (1) CAPSULE DAILY. 12/10/19   [name], MD  OXYGEN Inhale 3 L into the lungs continuous.     [provider]  pravastatin (PRAVACHOL) 20 MG tablet Take 1 tablet (20 mg total) by mouth daily. 01/05/20   [name], MD  predniSONE (DELTASONE) 10 MG tablet Take 1 tablet (10 mg total) by mouth daily with breakfast. Patient not taking: Reported on 12/26/2019 10/27/19   Newt Minion, MD  sodium chloride HYPERTONIC 3 % nebulizer solution 1 ampule neb every 6 hours if needed to clear mucus 08/14/17   Deneise Lever, MD  Spacer/Aero-Holding Chambers (AEROCHAMBER MV) inhaler Use as instructed 08/28/17   Deneise Lever, MD  STIOLTO RESPIMAT 2.5-2.5 MCG/ACT AERS USE 2 PUFFS DAILY 11/05/19   Deneise Lever, MD  Vitamin D, Ergocalciferol, (DRISDOL) 1.25 MG (50000 UT) CAPS capsule Take 1 capsule (50,000 Units total) by mouth once a week. Patient not taking: Reported on 12/26/2019 09/13/18   [name], MD    Allergies    Chlordiazepoxide-clidinium, Biaxin [clarithromycin], Tramadol, and Codeine  Review of Systems   Review of Systems  Constitutional: Positive for fatigue.  Neurological: Positive for weakness.  All other systems reviewed and are negative.   Physical Exam Updated Vital Signs BP (!) 183/76   Pulse (!) 47   Temp 99.6 F (37.6 C) (Oral)   Resp 19   Ht 5' 3"  (1.6 m)   Wt 82.5 kg   SpO2 94%   BMI 32.22 kg/m  Physical Exam Vitals and nursing note reviewed.  Constitutional:      Appearance: Normal appearance.  HENT:     Head: Normocephalic and  atraumatic.     Right Ear: External ear normal.     Left Ear: External ear normal.     Nose: Nose normal.     Mouth/Throat:     Mouth: Mucous membranes are dry.  Eyes:     Extraocular Movements: Extraocular movements intact.     Conjunctiva/sclera: Conjunctivae normal.     Pupils: Pupils are equal, round, and reactive to light.  Cardiovascular:     Rate and Rhythm: Regular rhythm. Bradycardia present.     Pulses: Normal pulses.     Heart sounds: Normal heart sounds.  Pulmonary:     Effort: Pulmonary effort is normal.     Breath sounds: Normal breath sounds.  Abdominal:     General: Abdomen is flat. Bowel sounds are normal.     Palpations: Abdomen is soft.  Musculoskeletal:        General: Normal range of motion.     Cervical back: Normal range of motion and neck supple.  Skin:    General: Skin is warm.     Capillary Refill: Capillary refill takes less than 2 seconds.  Neurological:     General: No focal deficit present.     Mental Status: She is alert and oriented to person, place, and time.  Psychiatric:        Mood and Affect: Mood normal.        Behavior: Behavior normal.        Thought Content: Thought content normal.        Judgment: Judgment normal.     ED Results / Procedures / Treatments   Labs (all labs ordered are listed, but only abnormal results are displayed) Labs Reviewed  CBC WITH DIFFERENTIAL/PLATELET - Abnormal; Notable for the following components:      Result Value   Neutro Abs 8.0 (*)    All other components within normal limits  COMPREHENSIVE METABOLIC PANEL - Abnormal; Notable for the following components:   Sodium 134 (*)    Chloride 96 (*)    Glucose, Bld 323 (*)    Albumin 3.4 (*)    All other components within normal limits  URINALYSIS, ROUTINE W REFLEX MICROSCOPIC - Abnormal; Notable for the following components:   APPearance TURBID (*)    Glucose, UA >=500 (*)    Hgb urine dipstick MODERATE (*)    Ketones, ur 20 (*)    Protein, ur  100 (*)    Leukocytes,Ua SMALL (*)    RBC / HPF >50 (*)    WBC, UA >50 (*)    Bacteria, UA MANY (*)    All other components within normal limits  CBG MONITORING, ED - Abnormal; Notable for the following components:   Glucose-Capillary 298 (*)    All other components within normal limits  I-STAT ARTERIAL BLOOD GAS, ED - Abnormal; Notable for the following components:   pO2, Arterial 73 (*)    Bicarbonate 28.9 (*)    Acid-Base Excess 3.0 (*)    Sodium 134 (*)    All other components within normal limits  TROPONIN I (HIGH SENSITIVITY) - Abnormal; Notable for the following components:   Troponin I (High Sensitivity) 312 (*)    All other components within normal limits  SARS CORONAVIRUS 2 BY RT PCR (HOSPITAL ORDER, [facility])  URINE CULTURE  CULTURE,  BLOOD (ROUTINE X 2)  CULTURE, BLOOD (ROUTINE X 2)  LACTIC ACID, PLASMA  CBC  CREATININE, SERUM  MAGNESIUM  PHOSPHORUS  TSH  HEMOGLOBIN A1C  TROPONIN I (HIGH SENSITIVITY)    EKG EKG Interpretation  Date/Time:  Tuesday January 13 2020 12:09:48 EDT Ventricular Rate:  47 PR Interval:    QRS Duration: 144 QT Interval:  662 QTC Calculation: 586 R Axis:   -56 Text Interpretation: Complete AV block with wide QRS complex RBBB and LAFB Confirmed by Isla Pence [phone]) on 01/13/2020 2:48:13 PM   Radiology CT Head Wo Contrast  Result Date: 01/13/2020 CLINICAL DATA:  Altered mental status EXAM: CT HEAD WITHOUT CONTRAST TECHNIQUE: Contiguous axial images were obtained from the base of the skull through the vertex without intravenous contrast. COMPARISON:  May 11, 2013 FINDINGS: Brain: Mild diffuse atrophy is stable. There is no intracranial mass, hemorrhage, extra-axial fluid collection, or midline shift. There is small vessel disease in the centra semiovale bilaterally, stable. There is a prior small lacunar infarct in the head of the caudate nucleus on the right. No acute appearing infarct is demonstrable.  Vascular: No appreciable hyperdense vessel. There is calcification in each carotid siphon region. Skull: The bony calvarium appears intact. Sinuses/Orbits: There is mucosal thickening in several ethmoid air cells. Orbits appear symmetric bilaterally. Evidence of previous cataract removals. Other: Visualized mastoid air cells are clear. IMPRESSION: Stable atrophy with periventricular small vessel disease. Prior small infarct in the head of the caudate nucleus on the right. No acute infarct. No mass or hemorrhage. Foci of arterial vascular calcification noted. There is mucosal thickening in several ethmoid air cells. Electronically Signed   By: Lowella Grip III M.D.   On: 01/13/2020 13:54   DG Chest Portable 1 View  Result Date: 01/13/2020 CLINICAL DATA:  Hypoxia EXAM: PORTABLE CHEST 1 VIEW COMPARISON:  08/06/2019 FINDINGS: Cardiac shadow is stable. Aortic calcifications are again seen. The lungs are well aerated bilaterally. Mild bibasilar atelectasis is noted. No sizable effusion is seen. No bony abnormality is noted. IMPRESSION: Mild bibasilar atelectasis.  No other focal abnormality is seen. Electronically Signed   By: Inez Catalina M.D.   On: 01/13/2020 12:21    Procedures Procedures (including critical care time)  Medications Ordered in ED Medications  enoxaparin (LOVENOX) injection 40 mg (has no administration in time range)  acetaminophen (TYLENOL) tablet 650 mg (has no administration in time range)    Or  acetaminophen (TYLENOL) suppository 650 mg (has no administration in time range)  ondansetron (ZOFRAN) tablet 4 mg (has no administration in time range)    Or  ondansetron (ZOFRAN) injection 4 mg (has no administration in time range)  insulin aspart (novoLOG) injection 0-15 Units (has no administration in time range)  insulin aspart (novoLOG) injection 0-5 Units (has no administration in time range)  cefTRIAXone (ROCEPHIN) 1 g in sodium chloride 0.9 % 100 mL IVPB (has no  administration in time range)    ED Course  I have reviewed the triage vital signs and the nursing notes.  Pertinent labs & imaging results that were available during my care of the patient were reviewed by me and considered in my medical decision making (see chart for details).    MDM Rules/Calculators/A&P                          Pt does have complete heart block.  She is tolerating it well.  She was d/w Dr. Virgina Jock (cards) who  agrees.  He spoke with EP who did see pt.  They recommend holding metoprolol. Pt's troponins are elevated.  Pt denies cp.  Cards will follow troponins.  Pt also has elevated blood sugar and a UTI.  Pt started on rocephin.  Oxygenating has been fine since she has been hooked up to functional oxygen.  ABG ok.  Pt has not been vaccinated against Covid, but Covid is negative.  Pt d/w Dr. Doristine Bosworth (triad) for admission.  KAELYNN IGO was evaluated in Emergency Department on 01/13/2020 for the symptoms described in the history of present illness. She was evaluated in the context of the global COVID-19 pandemic, which necessitated consideration that the patient might be at risk for infection with the SARS-CoV-2 virus that causes COVID-19. Institutional protocols and algorithms that pertain to the evaluation of patients at risk for COVID-19 are in a state of rapid change based on information released by regulatory bodies including the CDC and federal and state organizations. These policies and algorithms were followed during the patient's care in the ED.  CRITICAL CARE Performed by: Isla Pence   Total critical care time: 30 minutes  Critical care time was exclusive of separately billable procedures and treating other patients.  Critical care was necessary to treat or prevent imminent or life-threatening deterioration.  Critical care was time spent personally by me on the following activities: development of treatment plan with patient and/or surrogate as well  as nursing, discussions with consultants, evaluation of patient's response to treatment, examination of patient, obtaining history from patient or surrogate, ordering and performing treatments and interventions, ordering and review of laboratory studies, ordering and review of radiographic studies, pulse oximetry and re-evaluation of patient's condition.  Final Clinical Impression(s) / ED Diagnoses Final diagnoses:  Complete heart block ([place])  Poorly controlled type 2 diabetes mellitus ([place])  Elevated troponin    Rx / DC Orders ED Discharge Orders    None       Isla Pence, MD 01/13/20 1454

## 2020-01-13 NOTE — Consult Note (Addendum)
ELECTROPHYSIOLOGY CONSULT NOTE    Patient ID: [name] MRN: [id number], DOB/AGE: 1935-07-03 84 y.o.  Admit date: 01/13/2020 Date of Consult: 01/13/2020  Primary Physician: [name], MD Primary Cardiologist: No primary care provider on file.  Electrophysiologist: New  Referring Provider: Dr. Doristine Bosworth  Patient Profile: [name] is a 84 y.o. female with a history of oxygen dependent COPD, GERD, obesity, diabetic neuropathy, and anxiety who is being seen today for the evaluation of CHB at the request of Dr. Dr. Doristine Bosworth  HPI:  [name] is a 84 y.o. female with medical history above.   She presented via EMS after she called them out for "not feeling well". Noted to have SPO2 of 70%, but her home O2 was turned off (a separate report states that her O2 tank was empty). Also noted to have confusion, notably doesn't remember what she did or how she felt yesterday.   Pt placed on 4 lpm via [place] and SPO2 increased to 90%.   EKG demonstrated CHB. Pt is unsure when she last took her medications. She is able to tell that she lives alone at home. She feels confused. She denies HA, visual changes, lightheadedness, dizziness, head trauma, seizures, chest pain, palpitations, leg swelling, N/V, diarrhea, fever, chills, cough, congestion, or abdominal pain.   UA markedly positive. UCx pending.   Pt is awake and alert and pleasantly converses. She denies pain or symptoms currently. She states she has had several bouts of "cystitis" but feels like it has been "months". She takes her medications every morning, but cannot remember if/what she took this am. Confusion is abnormal for her. She lives alone at baseline. Denies recent illness, fevers, or chills. She denies any history of chest pain or "heart problems" although recent PCP notes quite her complaining of "palpitations"   COVID test is negative.   Past Medical History:  Diagnosis Date  . Allergic rhinitis, cause unspecified     Sinus CT Rec 12-23-2009  . Arthritis   . Benign paroxysmal positional vertigo 08/09/2012  . 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 Complication of anesthesia    takes along time to wake up  . Diabetes mellitus   . Diarrhea   . Diverticulosis   . Esophageal reflux   . Esophageal stricture   . GERD (gastroesophageal reflux disease)   . Glaucoma   . Hiatal hernia   . Hypertension   . Irritable bowel syndrome   . Neuropathy in diabetes ([place]) 08/09/2012  . Other chronic nonalcoholic liver disease   . Oxygen deficiency   . Sciatica   . Scoliosis   . Shingles 2010  . Shortness of breath   . Steatohepatitis      Surgical History:  Past Surgical History:  Procedure Laterality Date  . APPENDECTOMY    . CARPAL TUNNEL RELEASE     right hand  . CHOLECYSTECTOMY OPEN  1978  . COLONOSCOPY  07-2001   mild diverticulosis  . ESOPHAGOGASTRODUODENOSCOPY  3790,24-09   H Hernia,es.stricture s/p dil 66F  . INTRAOCULAR LENS INSERTION Bilateral   . LIVER BIOPSY  [phone]  . PARATHYROID EXPLORATION    . TONSILLECTOMY AND ADENOIDECTOMY    . TOTAL ABDOMINAL HYSTERECTOMY    . ULNAR NERVE TRANSPOSITION  12/07/2011   Procedure: ULNAR NERVE DECOMPRESSION/TRANSPOSITION;this was cancelled-not done  Surgeon: Cammie Sickle., MD;  Location: [facility];  Service: Orthopedics;  Laterality: Right;  right ulnar  nerve in situ decompression  . ULNAR TUNNEL RELEASE  03/07/2012   Procedure: CUBITAL TUNNEL RELEASE;  Surgeon: Roseanne Kaufman, MD;  Location: [facility];  Service: Orthopedics;  Laterality: Right;  ulnar nerve release at the elbow       (Not in a hospital admission)   Inpatient Medications:  . enoxaparin (LOVENOX) injection  40 mg Subcutaneous Q24H  . insulin aspart  0-15 Units Subcutaneous TID WC  . insulin aspart  0-5 Units Subcutaneous QHS    Allergies:  Allergies  Allergen Reactions  .  Chlordiazepoxide-Clidinium Other (See Comments)    Sleepy,weak  . Biaxin [Clarithromycin] Other (See Comments)    Foul taste, abd pain, diarrhea  . Tramadol Other (See Comments)    Caused tremors  . Codeine Other (See Comments)    REACTION: gi upset- only in high doses per pt.  Can tolerate cough syrup with codeine.     Social History   Socioeconomic History  . Marital status: Single    Spouse name: Not on file  . Number of children: 0  . Years of education: Not on file  . Highest education level: Not on file  Occupational History  . Occupation: Retired   Tobacco Use  . Smoking status: Former Smoker    Types: Cigarettes    Quit date: 05/08/1988    Years since quitting: 31.7  . Smokeless tobacco: Never Used  Vaping Use  . Vaping Use: Never used  Substance and Sexual Activity  . Alcohol use: No    Alcohol/week: 0.0 standard drinks  . Drug use: No  . Sexual activity: Never  Other Topics Concern  . Not on file  Social History Narrative  . Not on file   Social Determinants of Health   Financial Resource Strain:   . Difficulty of Paying Living Expenses: Not on file  Food Insecurity:   . Worried About Charity fundraiser in the Last Year: Not on file  . Ran Out of Food in the Last Year: Not on file  Transportation Needs:   . Lack of Transportation (Medical): Not on file  . Lack of Transportation (Non-Medical): Not on file  Physical Activity:   . Days of Exercise per Week: Not on file  . Minutes of Exercise per Session: Not on file  Stress:   . Feeling of Stress : Not on file  Social Connections:   . Frequency of Communication with Friends and Family: Not on file  . Frequency of Social Gatherings with Friends and Family: Not on file  . Attends Religious Services: Not on file  . Active Member of Clubs or Organizations: Not on file  . Attends Archivist Meetings: Not on file  . Marital Status: Not on file  Intimate Partner Violence:   . Fear of Current or  Ex-Partner: Not on file  . Emotionally Abused: Not on file  . Physically Abused: Not on file  . Sexually Abused: Not on file     Family History  Problem Relation Age of Onset  . Heart disease Father   . Lung cancer Mother        small cell;Byssinosis  . Lung cancer Sister   . Liver cancer Sister        ? mets from another area of the body  . Diabetes Other        grandmother  . Stroke Maternal Grandfather      Review of Systems: All other systems reviewed and are otherwise  negative except as noted above.  Physical Exam: Vitals:   01/13/20 1200 01/13/20 1204 01/13/20 1230 01/13/20 1245  BP:  (!) 190/48 (!) 157/45 (!) 183/76  Pulse:  (!) 47 (!) 47 (!) 47  Resp:  (!) 22 (!) 27 19  Temp:  99.6 F (37.6 C)    TempSrc:  Oral    SpO2:  96% 94% 94%  Weight: 82.5 kg     Height: 5' 3"  (1.6 m)       GEN- The patient is well appearing, alert and oriented x 3 today.   HEENT: normocephalic, atraumatic; sclera clear, conjunctiva pink; hearing intact; oropharynx clear; neck supple Lungs- Clear to ausculation bilaterally, normal work of breathing.  No wheezes, rales, rhonchi Heart- Slow rate and slightly irregular rhythm, no murmurs, rubs or gallops GI- soft, non-tender, non-distended, bowel sounds present Extremities- no clubbing, cyanosis, or edema; DP/PT/radial pulses 2+ bilaterally MS- no significant deformity or atrophy Skin- warm and dry, no rash or lesion Psych- euthymic mood, pleasant affect Neuro- strength and sensation are intact  Labs:   Lab Results  Component Value Date   WBC 10.0 01/13/2020   HGB 13.9 01/13/2020   HCT 41.0 01/13/2020   MCV 86.3 01/13/2020   PLT 167 01/13/2020    Recent Labs  Lab 01/13/20 1213 01/13/20 1213 01/13/20 1226  NA 134*   < > 134*  K 4.8   < > 4.7  CL 96*  --   --   CO2 28  --   --   BUN 15  --   --   CREATININE 0.78  --   --   CALCIUM 9.0  --   --   PROT 7.1  --   --   BILITOT 0.8  --   --   ALKPHOS 50  --   --   ALT 15   --   --   AST 20  --   --   GLUCOSE 323*  --   --    < > = values in this interval not displayed.      Radiology/Studies: CT Head Wo Contrast  Result Date: 01/13/2020 CLINICAL DATA:  Altered mental status EXAM: CT HEAD WITHOUT CONTRAST TECHNIQUE: Contiguous axial images were obtained from the base of the skull through the vertex without intravenous contrast. COMPARISON:  May 11, 2013 FINDINGS: Brain: Mild diffuse atrophy is stable. There is no intracranial mass, hemorrhage, extra-axial fluid collection, or midline shift. There is small vessel disease in the centra semiovale bilaterally, stable. There is a prior small lacunar infarct in the head of the caudate nucleus on the right. No acute appearing infarct is demonstrable. Vascular: No appreciable hyperdense vessel. There is calcification in each carotid siphon region. Skull: The bony calvarium appears intact. Sinuses/Orbits: There is mucosal thickening in several ethmoid air cells. Orbits appear symmetric bilaterally. Evidence of previous cataract removals. Other: Visualized mastoid air cells are clear. IMPRESSION: Stable atrophy with periventricular small vessel disease. Prior small infarct in the head of the caudate nucleus on the right. No acute infarct. No mass or hemorrhage. Foci of arterial vascular calcification noted. There is mucosal thickening in several ethmoid air cells. Electronically Signed   By: Lowella Grip III M.D.   On: 01/13/2020 13:54   DG Chest Portable 1 View  Result Date: 01/13/2020 CLINICAL DATA:  Hypoxia EXAM: PORTABLE CHEST 1 VIEW COMPARISON:  08/06/2019 FINDINGS: Cardiac shadow is stable. Aortic calcifications are again seen. The lungs are well aerated bilaterally. Mild bibasilar  atelectasis is noted. No sizable effusion is seen. No bony abnormality is noted. IMPRESSION: Mild bibasilar atelectasis.  No other focal abnormality is seen. Electronically Signed   By: Inez Catalina M.D.   On: 01/13/2020 12:21    EKG: on  arrival shows CHB at 47 bpm (personally reviewed)  Baseline EKG 05/24/2015 showed sinus bradyt at 59 at with PR interval 146 ms and RBBB at 138 ms (personally reviewed)  TELEMETRY: CHB rates 30-40s (personally reviewed)  Assessment/Plan: 1.  CHB New diagnosis. RBBB at baseline (QRS 138 in 2017) Hold AV nodal blockade. Denies ischemic symptoms -> Echo pending. HS trop 312 COVID pending CXR and CT head without acute findings.  Mg and TSH pending I briefly discussed PPM implantation, but she remains slightly confused. I will re-discuss risks and benefits as we get closer to the (possible) procedure. Will re-evaluate mental status and conduction in am.   2. Acute metabolic encephalopathy ? Low perfusion in setting of CHB.  UA markedly abnormal with turbid appearance, ++ glucose, Moderate Hgb, + ketones, + proteins, + leukocytes, + RBCs and + WBCs with mayn bacteria  3. DM2 BMET showed glucose 323 which appears new finding.  HgbA1c pending. Per primary.   4. UTI UA as above.  UCx pending.  Per primary.    For questions or updates, please contact [place] Please consult [email or web address] for contact info under Cardiology/STEMI.  Signed, Hailly Friar, PA-C  01/13/2020 3:32 PM  I have seen, examined the patient, and reviewed the above assessment and plan.  Changes to above are made where necessary.  On exam, frail and fragile.  Bradycardic rhythm.  Hold metoprolol.  Primary team to manage UTI. EP to follow. May require pacing if AV block dose not resolve off of metoprolol.  She is currently very stable.  There is no indication for temporary pacing.  Co Sign: Thompson Grayer, MD 01/13/2020 6:14 PM

## 2020-01-14 ENCOUNTER — Inpatient Hospital Stay (HOSPITAL_COMMUNITY)
Admission: EM | Disposition: A | Discharge status: Home / Self Care | Payer: Self-pay | Source: Home / Self Care | Attending: Family Medicine

## 2020-01-14 DIAGNOSIS — Z950 Presence of cardiac pacemaker: Secondary | ICD-10-CM

## 2020-01-14 DIAGNOSIS — L8990 Pressure ulcer of unspecified site, unspecified stage: Secondary | ICD-10-CM | POA: Insufficient documentation

## 2020-01-14 HISTORY — DX: Presence of cardiac pacemaker: Z95.0

## 2020-01-14 HISTORY — PX: PACEMAKER IMPLANT: EP1218

## 2020-01-14 LAB — GLUCOSE, CAPILLARY
Glucose-Capillary: 151 mg/dL — ABNORMAL HIGH (ref 70–99)
Glucose-Capillary: 173 mg/dL — ABNORMAL HIGH (ref 70–99)
Glucose-Capillary: 183 mg/dL — ABNORMAL HIGH (ref 70–99)
Glucose-Capillary: 187 mg/dL — ABNORMAL HIGH (ref 70–99)
Glucose-Capillary: 200 mg/dL — ABNORMAL HIGH (ref 70–99)
Glucose-Capillary: 214 mg/dL — ABNORMAL HIGH (ref 70–99)
Glucose-Capillary: 224 mg/dL — ABNORMAL HIGH (ref 70–99)

## 2020-01-14 LAB — CBC
HCT: 42.4 % (ref 36.0–46.0)
Hemoglobin: 13.5 g/dL (ref 12.0–15.0)
MCH: 27.3 pg (ref 26.0–34.0)
MCHC: 31.8 g/dL (ref 30.0–36.0)
MCV: 85.8 fL (ref 80.0–100.0)
Platelets: 176 10*3/uL (ref 150–400)
RBC: 4.94 MIL/uL (ref 3.87–5.11)
RDW: 15.6 % — ABNORMAL HIGH (ref 11.5–15.5)
WBC: 12.2 10*3/uL — ABNORMAL HIGH (ref 4.0–10.5)
nRBC: 0.2 % (ref 0.0–0.2)

## 2020-01-14 LAB — COMPREHENSIVE METABOLIC PANEL
ALT: 16 U/L (ref 0–44)
AST: 23 U/L (ref 15–41)
Albumin: 3.1 g/dL — ABNORMAL LOW (ref 3.5–5.0)
Alkaline Phosphatase: 50 U/L (ref 38–126)
Anion gap: 9 (ref 5–15)
BUN: 24 mg/dL — ABNORMAL HIGH (ref 8–23)
CO2: 29 mmol/L (ref 22–32)
Calcium: 9 mg/dL (ref 8.9–10.3)
Chloride: 98 mmol/L (ref 98–111)
Creatinine, Ser: 0.91 mg/dL (ref 0.44–1.00)
GFR calc Af Amer: >60 mL/min (ref >60)
GFR calc non Af Amer: 59 mL/min — ABNORMAL LOW (ref >60)
Glucose, Bld: 225 mg/dL — ABNORMAL HIGH (ref 70–99)
Potassium: 4.5 mmol/L (ref 3.5–5.1)
Sodium: 136 mmol/L (ref 135–145)
Total Bilirubin: 0.8 mg/dL (ref 0.3–1.2)
Total Protein: 6.5 g/dL (ref 6.5–8.1)

## 2020-01-14 LAB — HEMOGLOBIN A1C
Hgb A1c MFr Bld: 9.8 % — ABNORMAL HIGH (ref 4.8–5.6)
Mean Plasma Glucose: 234.56 mg/dL

## 2020-01-14 LAB — SURGICAL PCR SCREEN
MRSA, PCR: NEGATIVE
Staphylococcus aureus: NEGATIVE

## 2020-01-14 SURGERY — PACEMAKER IMPLANT

## 2020-01-14 MED ORDER — SODIUM CHLORIDE 0.9 % IV SOLN
80.0000 mg | INTRAVENOUS | Status: AC
  Administered 2020-01-14: 80 mg
  Filled 2020-01-14: qty 2

## 2020-01-14 MED ORDER — INSULIN GLARGINE 100 UNIT/ML ~~LOC~~ SOLN
20.0000 [IU] | Freq: Every day | SUBCUTANEOUS | Status: DC
  Administered 2020-01-14 – 2020-01-15 (×2): 20 [IU] via SUBCUTANEOUS
  Filled 2020-01-14 (×3): qty 0.2

## 2020-01-14 MED ORDER — ONDANSETRON HCL 4 MG/2ML IJ SOLN: 4.0000 mg | Freq: Four times a day (QID) | INTRAMUSCULAR | Status: DC | PRN

## 2020-01-14 MED ORDER — LIDOCAINE HCL 1 % IJ SOLN
INTRAMUSCULAR | Status: AC
  Filled 2020-01-14: qty 60

## 2020-01-14 MED ORDER — SODIUM CHLORIDE 0.9 % IV SOLN
1.0000 g | INTRAVENOUS | Status: DC
  Filled 2020-01-14: qty 10

## 2020-01-14 MED ORDER — HEPARIN (PORCINE) IN NACL 1000-0.9 UT/500ML-% IV SOLN
INTRAVENOUS | Status: AC
  Filled 2020-01-14: qty 500

## 2020-01-14 MED ORDER — ONDANSETRON HCL 4 MG/2ML IJ SOLN
INTRAMUSCULAR | Status: AC
  Filled 2020-01-14: qty 2

## 2020-01-14 MED ORDER — METOPROLOL TARTRATE 5 MG/5ML IV SOLN
INTRAVENOUS | Status: AC
  Filled 2020-01-14: qty 5

## 2020-01-14 MED ORDER — METOPROLOL TARTRATE 5 MG/5ML IV SOLN
INTRAVENOUS | Status: DC | PRN
  Administered 2020-01-14: 5 mg via INTRAVENOUS

## 2020-01-14 MED ORDER — SODIUM CHLORIDE 0.9 % IV SOLN: INTRAVENOUS | Status: DC

## 2020-01-14 MED ORDER — SODIUM CHLORIDE 0.9 % IV SOLN
INTRAVENOUS | Status: AC
  Filled 2020-01-14: qty 2

## 2020-01-14 MED ORDER — CHLORHEXIDINE GLUCONATE 4 % EX LIQD
60.0000 mL | Freq: Once | CUTANEOUS | Status: AC
  Administered 2020-01-14: 4 via TOPICAL
  Filled 2020-01-14: qty 60

## 2020-01-14 MED ORDER — HYDROCODONE-ACETAMINOPHEN 5-325 MG PO TABS
1.0000 | ORAL_TABLET | Freq: Once | ORAL | Status: AC
  Administered 2020-01-14: 1 via ORAL
  Filled 2020-01-14: qty 1

## 2020-01-14 MED ORDER — CEFAZOLIN SODIUM-DEXTROSE 2-4 GM/100ML-% IV SOLN
2.0000 g | INTRAVENOUS | Status: AC
  Administered 2020-01-14: 2 g via INTRAVENOUS

## 2020-01-14 MED ORDER — ONDANSETRON HCL 4 MG/2ML IJ SOLN
INTRAMUSCULAR | Status: DC | PRN
  Administered 2020-01-14: 2 mg via INTRAVENOUS

## 2020-01-14 MED ORDER — MAGNESIUM SULFATE 2 GM/50ML IV SOLN
2.0000 g | Freq: Once | INTRAVENOUS | Status: AC
  Administered 2020-01-14: 2 g via INTRAVENOUS
  Filled 2020-01-14: qty 50

## 2020-01-14 MED ORDER — LIDOCAINE HCL (PF) 1 % IJ SOLN
INTRAMUSCULAR | Status: DC | PRN
  Administered 2020-01-14: 30 mL

## 2020-01-14 MED ORDER — CEFAZOLIN SODIUM-DEXTROSE 1-4 GM/50ML-% IV SOLN
1.0000 g | Freq: Four times a day (QID) | INTRAVENOUS | Status: DC
  Administered 2020-01-14 – 2020-01-15 (×2): 1 g via INTRAVENOUS
  Filled 2020-01-14 (×3): qty 50

## 2020-01-14 MED ORDER — HEPARIN (PORCINE) IN NACL 1000-0.9 UT/500ML-% IV SOLN
INTRAVENOUS | Status: DC | PRN
  Administered 2020-01-14: 500 mL

## 2020-01-14 MED ORDER — ACETAMINOPHEN 325 MG PO TABS
325.0000 mg | ORAL_TABLET | ORAL | Status: DC | PRN
  Administered 2020-01-15 (×2): 650 mg via ORAL
  Filled 2020-01-14 (×2): qty 2

## 2020-01-14 MED ORDER — CHLORHEXIDINE GLUCONATE 4 % EX LIQD
60.0000 mL | Freq: Once | CUTANEOUS | Status: DC
  Filled 2020-01-14: qty 60

## 2020-01-14 MED ORDER — CEFAZOLIN SODIUM-DEXTROSE 2-4 GM/100ML-% IV SOLN
INTRAVENOUS | Status: AC
  Filled 2020-01-14: qty 100

## 2020-01-14 SURGICAL SUPPLY — 8 items
CABLE SURGICAL S-101-97-12 (CABLE) ×2 IMPLANT
GUIDEWIRE ANGLED .035X150CM (WIRE) ×1 IMPLANT
LEAD TENDRIL MRI 46CM LPA1200M (Lead) ×1 IMPLANT
LEAD TENDRIL MRI 52CM LPA1200M (Lead) ×1 IMPLANT
PACEMAKER ASSURITY DR-RF (Pacemaker) ×1 IMPLANT
PAD PRO RADIOLUCENT 2001M-C (PAD) ×2 IMPLANT
SHEATH 8FR PRELUDE SNAP 13 (SHEATH) ×2 IMPLANT
TRAY PACEMAKER INSERTION (PACKS) ×2 IMPLANT

## 2020-01-14 NOTE — Progress Notes (Signed)
SLP Cancellation Note  Patient Details Name: [name] MRN: [id number] DOB: 1935/12/05   Cancelled treatment:       Reason Eval/Treat Not Completed: Patient at procedure or test/unavailable (Pt curently with OT. SLP will f/u)  Tobie Poet I. Hardin Negus, [place], [place] Office number [phone] Pager [place] 01/14/2020, 9:31 AM

## 2020-01-14 NOTE — Progress Notes (Addendum)
Electrophysiology Rounding Note  Patient Name: [name] Date of Encounter: 01/14/2020  Primary Cardiologist: No primary care provider on file. Electrophysiologist: New   Subjective   The patient is doing well this am. No current complaints. She is still unclear on the events leading up to her admission, but knows that she is in the hospital and remembers me from yesterday. She is able to tell me she lives alone, has no family or POA, and is very independent at baseline.   Inpatient Medications    Scheduled Meds: . enoxaparin (LOVENOX) injection  40 mg Subcutaneous Q24H  . insulin aspart  0-15 Units Subcutaneous TID WC  . insulin aspart  0-5 Units Subcutaneous QHS   Continuous Infusions: . sodium chloride    . cefTRIAXone (ROCEPHIN)  IV     PRN Meds: acetaminophen **OR** acetaminophen, ondansetron **OR** ondansetron (ZOFRAN) IV   Vital Signs    Vitals:   01/14/20 0421 01/14/20 0730 01/14/20 0800 01/14/20 0906  BP:  (!) 142/41 (!) 136/45   Pulse:  (!) 53  (!) 51  Resp:  20    Temp:  98.4 F (36.9 C)  98.3 F (36.8 C)  TempSrc:  Oral  Oral  SpO2:  94%    Weight: 78.9 kg     Height:        Intake/Output Summary (Last 24 hours) at 01/14/2020 0917 Last data filed at 01/14/2020 0421 Gross per 24 hour  Intake 100 ml  Output 100 ml  Net 0 ml   Filed Weights   01/13/20 1200 01/13/20 2022 01/14/20 0421  Weight: 82.5 kg 81.2 kg 78.9 kg    Physical Exam    GEN- The patient is well appearing, alert and oriented x 3 today.   Head- normocephalic, atraumatic Eyes-  Sclera clear, conjunctiva pink Ears- hearing intact Oropharynx- clear Neck- supple Lungs- Clear to ausculation bilaterally, normal work of breathing Heart- Regular rate and rhythm, no murmurs, rubs or gallops GI- soft, NT, ND, + BS Extremities- no clubbing or cyanosis. No edema Skin- no rash or lesion Psych- euthymic mood, full affect Neuro- strength and sensation are intact  Labs     CBC Recent Labs    01/13/20 1213 01/13/20 1226 01/13/20 1603 01/14/20 0338  WBC 10.0   < > 10.2 12.2*  NEUTROABS 8.0*  --   --   --   HGB 12.5   < > 13.6 13.5  HCT 41.0   < > 44.1 42.4  MCV 86.3   < > 85.0 85.8  PLT 167   < > PLATELET CLUMPS NOTED ON SMEAR, UNABLE TO ESTIMATE 176   < > = values in this interval not displayed.   Basic Metabolic Panel Recent Labs    01/13/20 1213 01/13/20 1213 01/13/20 1226 01/13/20 1603 01/14/20 0338  NA 134*   < > 134*  --  136  K 4.8   < > 4.7  --  4.5  CL 96*  --   --   --  98  CO2 28  --   --   --  29  GLUCOSE 323*  --   --   --  225*  BUN 15  --   --   --  24*  CREATININE 0.78   < >  --  0.82 0.91  CALCIUM 9.0  --   --   --  9.0  MG  --   --   --  1.7  --   PHOS  --   --   --  3.4  --    < > = values in this interval not displayed.   Liver Function Tests Recent Labs    01/13/20 1213 01/14/20 0338  AST 20 23  ALT 15 16  ALKPHOS 50 50  BILITOT 0.8 0.8  PROT 7.1 6.5  ALBUMIN 3.4* 3.1*   No results for input(s): LIPASE, AMYLASE in the last 72 hours. Cardiac Enzymes No results for input(s): CKTOTAL, CKMB, CKMBINDEX, TROPONINI in the last 72 hours.   Telemetry    CHB -> short period of NSR early this am vs very regular 2:1 AV block with buried p waves, Now intermittently 2:1 and 3:1 AV block (personally reviewed)  Radiology    CT Head Wo Contrast  Result Date: 01/13/2020 CLINICAL DATA:  Altered mental status EXAM: CT HEAD WITHOUT CONTRAST TECHNIQUE: Contiguous axial images were obtained from the base of the skull through the vertex without intravenous contrast. COMPARISON:  May 11, 2013 FINDINGS: Brain: Mild diffuse atrophy is stable. There is no intracranial mass, hemorrhage, extra-axial fluid collection, or midline shift. There is small vessel disease in the centra semiovale bilaterally, stable. There is a prior small lacunar infarct in the head of the caudate nucleus on the right. No acute appearing infarct is  demonstrable. Vascular: No appreciable hyperdense vessel. There is calcification in each carotid siphon region. Skull: The bony calvarium appears intact. Sinuses/Orbits: There is mucosal thickening in several ethmoid air cells. Orbits appear symmetric bilaterally. Evidence of previous cataract removals. Other: Visualized mastoid air cells are clear. IMPRESSION: Stable atrophy with periventricular small vessel disease. Prior small infarct in the head of the caudate nucleus on the right. No acute infarct. No mass or hemorrhage. Foci of arterial vascular calcification noted. There is mucosal thickening in several ethmoid air cells. Electronically Signed   By: Lowella Grip III M.D.   On: 01/13/2020 13:54   DG Chest Portable 1 View  Result Date: 01/13/2020 CLINICAL DATA:  Hypoxia EXAM: PORTABLE CHEST 1 VIEW COMPARISON:  08/06/2019 FINDINGS: Cardiac shadow is stable. Aortic calcifications are again seen. The lungs are well aerated bilaterally. Mild bibasilar atelectasis is noted. No sizable effusion is seen. No bony abnormality is noted. IMPRESSION: Mild bibasilar atelectasis.  No other focal abnormality is seen. Electronically Signed   By: Inez Catalina M.D.   On: 01/13/2020 12:21   ECHOCARDIOGRAM COMPLETE  Result Date: 01/13/2020    ECHOCARDIOGRAM REPORT   Patient Name:   [name] Date of Exam: 01/13/2020 Medical Rec #:  [id number]        Height:       63.0 in Accession #:    [id number]       Weight:       181.9 lb Date of Birth:  1936/04/17        BSA:          1.857 m Patient Age:    84 years         BP:           183/76 mmHg Patient Gender: F                HR:           46 bpm. Exam Location:  Inpatient Procedure: 2D Echo, Cardiac Doppler and Color Doppler Indications:    Heartblock I45.9  History:        Patient has prior history of Echocardiogram examinations, most  recent 05/12/2013. Risk Factors:Diabetes, Hypertension and GERD.  Sonographer:    Bernadene Person RDCS Referring Phys:  [id number] [place]  Sonographer Comments: Technically difficult study due to poor echo windows. Image acquisition challenging due to uncooperative patient. Patient was unable to turn on her side or stay still. During the exam the patient expressed that she wanted the test to be over and did not want it to continue even after explaining the importance of the exam. IMPRESSIONS  1. Left ventricular ejection fraction, by estimation, is 60 to 65%. The left ventricle has normal function. The left ventricle has no regional wall motion abnormalities. Left ventricular diastolic parameters are indeterminate.  2. Right ventricular systolic function is normal. The right ventricular size is normal.  3. The mitral valve is normal in structure. No evidence of mitral valve regurgitation. No evidence of mitral stenosis.  4. The aortic valve is tricuspid. Aortic valve regurgitation is not visualized. Mild to moderate aortic valve sclerosis/calcification is present, without any evidence of aortic stenosis.  5. The inferior vena cava is normal in size with greater than 50% respiratory variability, suggesting right atrial pressure of 3 mmHg. FINDINGS  Left Ventricle: Left ventricular ejection fraction, by estimation, is 60 to 65%. The left ventricle has normal function. The left ventricle has no regional wall motion abnormalities. The left ventricular internal cavity size was normal in size. There is  no left ventricular hypertrophy. Left ventricular diastolic parameters are indeterminate. Right Ventricle: The right ventricular size is normal. No increase in right ventricular wall thickness. Right ventricular systolic function is normal. Left Atrium: Left atrial size was normal in size. Right Atrium: Right atrial size was normal in size. Pericardium: There is no evidence of pericardial effusion. Mitral Valve: The mitral valve is normal in structure. Normal mobility of the mitral valve leaflets. Mild to moderate mitral  annular calcification. No evidence of mitral valve regurgitation. No evidence of mitral valve stenosis. Tricuspid Valve: The tricuspid valve is normal in structure. Tricuspid valve regurgitation is trivial. No evidence of tricuspid stenosis. Aortic Valve: The aortic valve is tricuspid. Aortic valve regurgitation is not visualized. Mild to moderate aortic valve sclerosis/calcification is present, without any evidence of aortic stenosis. Pulmonic Valve: The pulmonic valve was normal in structure. Pulmonic valve regurgitation is not visualized. No evidence of pulmonic stenosis. Aorta: The aortic root is normal in size and structure. Venous: The inferior vena cava is normal in size with greater than 50% respiratory variability, suggesting right atrial pressure of 3 mmHg. IAS/Shunts: No atrial level shunt detected by color flow Doppler.  LEFT VENTRICLE PLAX 2D LVIDd:         4.60 cm LVIDs:         2.80 cm LV PW:         0.80 cm LV IVS:        0.90 cm LVOT diam:     1.90 cm LV SV:         69 LV SV Index:   37 LVOT Area:     2.84 cm  RIGHT VENTRICLE TAPSE (M-mode): 2.0 cm LEFT ATRIUM             Index       RIGHT ATRIUM          Index LA diam:        3.50 cm 1.88 cm/m  RA Area:     8.44 cm LA Vol (A2C):   40.2 ml 21.65 ml/m RA Volume:   13.40 ml  7.22 ml/m LA Vol (A4C):   22.6 ml 12.17 ml/m LA Biplane Vol: 31.2 ml 16.80 ml/m  AORTIC VALVE LVOT Vmax:   100.00 cm/s LVOT Vmean:  73.300 cm/s LVOT VTI:    0.245 m  AORTA Ao Root diam: 3.30 cm Ao Asc diam:  3.40 cm  SHUNTS Systemic VTI:  0.24 m Systemic Diam: 1.90 cm Fransico Him MD Electronically signed by Fransico Him MD Signature Date/Time: 01/13/2020/4:04:02 PM    Final     Patient Profile     [name] is a 84 y.o. female with a history of oxygen dependent COPD, GERD, obesity, diabetic neuropathy, and anxiety who is being seen today for the evaluation of CHB at the request of Dr. Doristine Bosworth  Assessment & Plan    1.  CHB / Advanced AV block New diagnosis.  RBBB at baseline (QRS 138 in 2017) She has had brief period of NSR overnight. Now she is intermittently in 2:1 and 3:1 block.  Last dose of Toprol yesterday am, unclear time.  Denies ischemic symptoms -> Echo normal EF this admission HS trop 312 COVID negative. CXR and CT head without acute findings.  Mg 1.7. Supp ordered.  TSH WNL Pt is alert to person, place, and situation this am. She lives at home alone, has no family, and no POA.  Explained risks, benefits, and alternatives to PPM implantation, including but not limited to bleeding, infection, pneumothorax, pericardial effusion, lead dislodgement, heart attack, stroke, or death.  Pt verbalized understanding and agrees to proceed if indicated. Will discuss further with Dr. Lovena Le who is to see this am. As below she did have confusion overnight.   2. Acute metabolic encephalopathy ? Low perfusion in setting of CHB.  UA markedly abnormal with turbid appearance, ++ glucose, Moderate Hgb, + ketones, + proteins, + leukocytes, + RBCs and + WBCs with mayn bacteria She is more clear this am. She had confusion overnight and pulled out her IV.   3. DM2 BMET showed glucose 323 which appears new finding.  HgbA1c pending. Per primary.   4. UTI UA as above.  UCx pending.  On Ceftriaxone.  5. Sacral ulcer Stage 1 sacral ulcer noted  6. HTN BP 130-140s this am. Higher overnight. Follow for now pending PPM decision.   For questions or updates, please contact [place] Please consult [email or web address] for contact info under Cardiology/STEMI.  Signed, Kadia Friar, PA-C  01/14/2020, 9:17 AM   EP Attending  Patient seen and examined. Her altered mentation has resolved. She is conversant and answers questions appropriately. She does not want a PPM but is willing to proceed. Her CHB persists although at times she has 2:1 AV block. I have discussed the indications/risks/benefits/goals/expectations of PPM insertion and she wishes to  proceed.  Carleene Overlie Keller Mikels,MD

## 2020-01-14 NOTE — Progress Notes (Signed)
Occupational Therapy Evaluation Patient Details Name: [name] MRN: [id number] DOB: 03-May-1936 Today's Date: 01/14/2020    History of Present Illness 84 y.o. female presenting with confusion, hypoxia with O2 sat 70% on RA, and bradycardia with EKG (+) third-degree AV block. PMHx significant for HTN, HLD, DM w/ neuropathy, chronic hypoxemic respiratory failure 2/2 underlying COPD on 2.5-3L O2 at baseline, obesity, and anxiety.    Clinical Impression   Patient is a questionable historian with ability to provide limited PLOF/home set-up information this date. Patient reports living alone in a single-level private residence and states that she was independence to [place] I for BADLs with use of DME. Patient also states that she was an independent to Mod I ambulation in home and community dwellings with occasional use of RW vs. rollator. Patient reports having no family nearby with occasional assist from neighbors. Patient states that she was still driving. Patient presents below baseline level of function requiring Max A grossly for BADLs and bed mobility. Unable to progress beyond sitting EOB this date 2/2 full body tremors and need for +2 assist 2/2 cognition with patient expressing fear of falling. Patient would benefit from continued acute OT services to maximize safety and independence with self-care tasks in prep for safe d/c to next level of care with recommendation for SNF rehab given CLOF and limited supervision/assist.      Follow Up Recommendations  SNF    Equipment Recommendations  Other (comment) (Defer to next level of care)    Recommendations for Other Services       Precautions / Restrictions Precautions Precautions: Fall Precaution Comments: High fall risk, seizure precautions, mitten restraints Restrictions Weight Bearing Restrictions: No      Mobility Bed Mobility Overal bed mobility: Needs Assistance Bed Mobility: Rolling;Sidelying to Sit;Sit to Supine Rolling: Max  assist Sidelying to sit: Max assist   Sit to supine: Max assist   General bed mobility comments: Maximal multimodal cues and increased time/effort for bed mobility with HOB flat.   Transfers Overall transfer level: Needs assistance               General transfer comment: Unable to progress beyond EOB 2/2 need for +2 assist    Balance Overall balance assessment: Needs assistance Sitting-balance support: Feet supported;Bilateral upper extremity supported Sitting balance-Leahy Scale: Poor Sitting balance - Comments: Initially Min A progressing to Min guard to maintain static sitting balance at EOB. Cues for hand/foot placement.      Standing balance-Leahy Scale: Zero Standing balance comment: Unable to progress                            ADL either performed or assessed with clinical judgement   ADL Overall ADL's : Needs assistance/impaired     Grooming: Maximal assistance;Sitting           Upper Body Dressing : Moderate assistance;Sitting   Lower Body Dressing: Maximal assistance;Sitting/lateral leans;Sit to/from stand;+2 for physical assistance   Toilet Transfer: Maximal assistance;+2 for safety/equipment   Toileting- Clothing Manipulation and Hygiene: Maximal assistance;+2 for safety/equipment       Functional mobility during ADLs:  (Unable to progress) General ADL Comments: Patient very anxious and perseverating on eating/drinking. Patient also with full body tremors and gross incooordination     Vision Baseline Vision/History: Wears glasses Wears Glasses: Reading only Patient Visual Report: No change from baseline Vision Assessment?:  (Unable to assess 2/2 cognition)     Perception Perception  Perception Tested?: No   Praxis Praxis Praxis tested?: Not tested    Pertinent Vitals/Pain Pain Assessment: Faces Faces Pain Scale: Hurts a little bit Pain Location: RUE Pain Descriptors / Indicators: Discomfort;Sore Pain Intervention(s):  Monitored during session;Repositioned;Relaxation     Hand Dominance Right   Extremity/Trunk Assessment Upper Extremity Assessment Upper Extremity Assessment: Generalized weakness   Lower Extremity Assessment Lower Extremity Assessment: Defer to PT evaluation   Cervical / Trunk Assessment Cervical / Trunk Assessment: Kyphotic   Communication Communication Communication: Expressive difficulties   Cognition Arousal/Alertness: Awake/alert Behavior During Therapy: Anxious Overall Cognitive Status: Impaired/Different from baseline Area of Impairment: Orientation;Memory;Safety/judgement;Awareness;Problem solving                 Orientation Level: Disoriented to;Time;Situation (Oriented to name with cues for DOB. )   Memory: Decreased short-term memory   Safety/Judgement: Decreased awareness of safety Awareness: Intellectual Problem Solving: Slow processing;Difficulty sequencing;Requires verbal cues;Requires tactile cues     General Comments  Patient in mitten restraints 2/2 agitation overnight.    Exercises     Shoulder Instructions      Home Living Family/patient expects to be discharged to:: Skilled nursing facility                             Home Equipment: Gilford Rile - 2 wheels;Walker - 4 wheels;Bedside commode;Shower seat          Prior Functioning/Environment Level of Independence: Independent with assistive device(s)        Comments: Patient questionable historian. Reports independence to Mod I at baseline with use of DME. Occasional use of RW vs. rollator in home and community dwellings.         OT Problem List: Decreased strength;Decreased range of motion;Decreased activity tolerance;Impaired balance (sitting and/or standing);Impaired vision/perception;Decreased coordination;Decreased cognition;Decreased safety awareness;Decreased knowledge of use of DME or AE;Pain      OT Treatment/Interventions: Self-care/ADL training;Therapeutic  exercise;Energy conservation;DME and/or AE instruction;Therapeutic activities;Cognitive remediation/compensation;Visual/perceptual remediation/compensation;Patient/family education;Balance training    OT Goals(Current goals can be found in the care plan section) Acute Rehab OT Goals Patient Stated Goal: To eat/drink OT Goal Formulation: Patient unable to participate in goal setting Time For Goal Achievement: 01/28/20 Potential to Achieve Goals: Fair ADL Goals Pt Will Perform Grooming: with set-up;sitting Pt Will Perform Upper Body Dressing: with set-up;sitting Pt Will Perform Lower Body Dressing: with min assist;sit to/from stand Pt Will Transfer to Toilet: with min assist;bedside commode;ambulating Pt Will Perform Toileting - Clothing Manipulation and hygiene: with min assist;sit to/from stand Additional ADL Goal #1: Patient will demonstrate increased orientation to person, place, time and situation in prep for safe d/c to next level of care.  OT Frequency: Min 2X/week   Barriers to D/C: Decreased caregiver support          Co-evaluation              AM-PAC OT "6 Clicks" Daily Activity     Outcome Measure Help from another person eating meals?: A Lot Help from another person taking care of personal grooming?: A Lot Help from another person toileting, which includes using toliet, bedpan, or urinal?: Total Help from another person bathing (including washing, rinsing, drying)?: Total Help from another person to put on and taking off regular upper body clothing?: A Lot Help from another person to put on and taking off regular lower body clothing?: A Lot 6 Click Score: 10   End of Session Nurse Communication: Mobility status  Activity  Tolerance: Patient tolerated treatment well Patient left: in bed;with call bell/phone within reach;with bed alarm set;with restraints reapplied  OT Visit Diagnosis: Unsteadiness on feet (R26.81);Other abnormalities of gait and mobility  (R26.89);Muscle weakness (generalized) (M62.81);Pain                Time: 2122-4825 OT Time Calculation (min): 35 min Charges:  OT General Charges $OT Visit: 1 Visit OT Evaluation $OT Eval Moderate Complexity: 1 Mod OT Treatments $Therapeutic Activity: 8-22 mins  Dariane Natzke H. OTR/L Supplemental OT, Department of rehab services [phone]  Tanairy Payeur R H. 01/14/2020, 10:18 AM

## 2020-01-14 NOTE — Evaluation (Signed)
Clinical/Bedside Swallow Evaluation Patient Details  Name: [name] MRN: [id number] Date of Birth: 08-15-1935  Today's Date: 01/14/2020 Time: SLP Start Time (ACUTE ONLY): 0959 SLP Stop Time (ACUTE ONLY): 1017 SLP Time Calculation (min) (ACUTE ONLY): 18 min  Past Medical History:  Past Medical History:  Diagnosis Date  . Allergic rhinitis, cause unspecified    Sinus CT Rec 12-23-2009  . Arthritis   . Benign paroxysmal positional vertigo 08/09/2012  . 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 Complication of anesthesia    takes along time to wake up  . Diabetes mellitus   . Diarrhea   . Diverticulosis   . Esophageal reflux   . Esophageal stricture   . GERD (gastroesophageal reflux disease)   . Glaucoma   . Hiatal hernia   . Hypertension   . Irritable bowel syndrome   . Neuropathy in diabetes ([place]) 08/09/2012  . Other chronic nonalcoholic liver disease   . Oxygen deficiency   . Sciatica   . Scoliosis   . Shingles 2010  . Shortness of breath   . Steatohepatitis    Past Surgical History:  Past Surgical History:  Procedure Laterality Date  . APPENDECTOMY    . CARPAL TUNNEL RELEASE     right hand  . CHOLECYSTECTOMY OPEN  1978  . COLONOSCOPY  07-2001   mild diverticulosis  . ESOPHAGOGASTRODUODENOSCOPY  1779,39-03   H Hernia,es.stricture s/p dil 35F  . INTRAOCULAR LENS INSERTION Bilateral   . LIVER BIOPSY  [phone]  . PARATHYROID EXPLORATION    . TONSILLECTOMY AND ADENOIDECTOMY    . TOTAL ABDOMINAL HYSTERECTOMY    . ULNAR NERVE TRANSPOSITION  12/07/2011   Procedure: ULNAR NERVE DECOMPRESSION/TRANSPOSITION;this was cancelled-not done  Surgeon: Cammie Sickle., MD;  Location: [place];  Service: Orthopedics;  Laterality: Right;  right ulnar nerve in situ decompression  . ULNAR TUNNEL RELEASE  03/07/2012   Procedure: CUBITAL TUNNEL RELEASE;  Surgeon: Roseanne Kaufman, MD;  Location: [place];  Service: Orthopedics;  Laterality: Right;  ulnar nerve release at the elbow     HPI:  Pt is an 84 y.o. female with medical history significant of hypertension, hyperlipidemia, diabetes mellitus, chronic hypoxemic respiratory failure secondary to underlying COPD-on 2.5 to 3 L of oxygen at home, obesity, GERD, diabetic neuropathy anxiety presents to emergency department with confusion. CT head was negative for acute changes. CXR 9/7: Mild bibasilar atelectasis without other focal abnormality.Pt is an 84 y.o. female with medical history significant of hypertension, hyperlipidemia, diabetes mellitus, chronic hypoxemic respiratory failure secondary to underlying COPD-on 2.5 to 3 L of oxygen at home, obesity, GERD, diabetic neuropathy anxiety presents to emergency department with confusion  hypoxia with O2 sat 70% on RA, and bradycardia with EKG (+) third-degree AV block. CT head was negative for acute changes. CXR 9/7: Mild bibasilar atelectasis without other focal abnormality.   Assessment / Plan / Recommendation Clinical Impression  Pt's case was discussed with Judson Roch, RN to determine whether the pt is NPO for a procedure or for the swallow evaluation. She indicated that the pt is NPO for the SLP swallow evaluation. Pt was therefore seen for bedside swallow evaluation. She denied a history of dysphagia. Oral mechanism exam was limited due to pt's difficulty following some commands; however, oral motor strength and ROM appeared grossly WFL. Dentition was limited with only maxillary dentures but pt stated that she "can pretty much eat anything". She  tolerated all solids and liquids without signs or symptoms of oropharyngeal dysphagia. Mastication time was mildly increased but within functional range. A regular texture diet with thin liquids is recommended at this time; SLP will see pt once more to ensure tolerance of the recommended diet, but further skilled SLP services will likely not be needed beyond  that point for swallowing. SLP Visit Diagnosis: Dysphagia, unspecified (R13.10)    Aspiration Risk  No limitations    Diet Recommendation Regular;Thin liquid   Liquid Administration via: Cup;Straw Medication Administration: Whole meds with puree Supervision: Patient able to self feed Compensations: Small sips/bites Postural Changes: Seated upright at 90 degrees    Other  Recommendations Oral Care Recommendations: Oral care BID   Follow up Recommendations Other (comment) (TBD)      Frequency and Duration min 1 x/week  1 week       Prognosis Prognosis for Safe Diet Advancement: Good Barriers to Reach Goals: Cognitive deficits      Swallow Study   General Date of Onset: 01/13/20 HPI: Pt is an 84 y.o. female with medical history significant of hypertension, hyperlipidemia, diabetes mellitus, chronic hypoxemic respiratory failure secondary to underlying COPD-on 2.5 to 3 L of oxygen at home, obesity, GERD, diabetic neuropathy anxiety presents to emergency department with confusion. CT head was negative for acute changes. CXR 9/7: Mild bibasilar atelectasis without other focal abnormality.Pt is an 84 y.o. female with medical history significant of hypertension, hyperlipidemia, diabetes mellitus, chronic hypoxemic respiratory failure secondary to underlying COPD-on 2.5 to 3 L of oxygen at home, obesity, GERD, diabetic neuropathy anxiety presents to emergency department with confusion  hypoxia with O2 sat 70% on RA, and bradycardia with EKG (+) third-degree AV block. CT head was negative for acute changes. CXR 9/7: Mild bibasilar atelectasis without other focal abnormality. Type of Study: Bedside Swallow Evaluation Previous Swallow Assessment: None Diet Prior to this Study: NPO Temperature Spikes Noted: No Respiratory Status: Nasal cannula History of Recent Intubation: No Behavior/Cognition: Alert;Cooperative;Pleasant mood Oral Cavity Assessment: Within Functional Limits Oral Care  Completed by SLP: No Vision: Functional for self-feeding Self-Feeding Abilities: Able to feed self Patient Positioning: Upright in bed Baseline Vocal Quality: Normal Volitional Cough: Strong Volitional Swallow: Able to elicit    Oral/Motor/Sensory Function Overall Oral Motor/Sensory Function: Within functional limits   Ice Chips Ice chips: Within functional limits Presentation: Spoon   Thin Liquid Thin Liquid: Within functional limits Presentation: Straw;Cup    Nectar Thick Nectar Thick Liquid: Not tested   Honey Thick Honey Thick Liquid: Not tested   Puree Puree: Within functional limits Presentation: Spoon   Solid     Solid: Within functional limits     Maicee Ullman I. Hardin Negus, [place], [place] Office number [phone] Pager [phone]  Horton Marshall 01/14/2020,10:40 AM

## 2020-01-14 NOTE — Progress Notes (Signed)
PROGRESS NOTE    [name]  HEN:[id number] DOB: 1935/08/31 DOA: 01/13/2020 PCP: No primary care provider on file.    Brief Narrative:  [name] is an 84 y.o. female with medical history significant of hypertension, hyperlipidemia, diabetes mellitus, chronic hypoxemic respiratory failure secondary to underlying COPD-on 2.5 to 3 L of oxygen at home, obesity, GERD, diabetic neuropathy anxiety presents to emergency department with confusion. [place] was found to be bradycardic and EKG shows third-degree AV block after EMS arrived. Upon arrival to ED: Patient  Remains bradycardic in 40s, EKG shows complete heart block,  CT head came back negative for acute findings.  Cardiology consulted.  Patient continued to remain in heart block.  Patient is going to have permanent pacemaker placement today.  Patient is also found to have UTI,  started on ceftriaxone.  Her altered mental status has improved.   Assessment & Plan:   Principal Problem:   Acute metabolic encephalopathy Active Problems:   COPD mixed type (HCC)   G E R D   Hypertension   Diabetic polyneuropathy associated with type 2 diabetes mellitus (HCC)   Complete heart block (HCC)   Acute on chronic respiratory failure with hypoxia (HCC)   Diabetes (HCC)   Elevated troponin   Acute metabolic encephalopathy:  >>> Improved. -Likely secondary to heart block & Hypoxia.  Patient bradycardic upon arrival.   EKG:  Complete Heart block,   Chest x-ray and CT head negative for acute findings.    Patient denies ACS symptoms.  COVID-19: Negative, lactic acid: WNL.    UA is positive for leukocyte, WBC, RBC however patient denies any urinary symptoms. -Admit patient stepdown unit for close monitoring on telemetry. -EDP consulted, plan : PPM scheduled today - Magnesium : normal. , TSH:  normal   -Monitor heart rate closely. -We will keep her NPO for now. -On fall precautions -Consult PT/OT/SLP  Complete heart block: -Reviewed  home medications.  Takes metoprolol at home.  Hold for now.  Reviewed EKG. - Cardio : scheduled for PPM today -Transthoracic echo is pending.  Acute on chronic hypoxemic respiratory failure secondary to COPD:  No wheezing noted on exam.  Chest x-ray negative for infection, COVID-19 negative  On continuous pulse ox.  We will try to wean off of oxygen as tolerated. -Continue home inhalers  Elevated troponin: Patient denies ACS symptoms. -Initial troponin 312-trend troponin. -Reviewed EKG.  Asymptomatic bacteriuria: -Patient denies any urinary symptoms including dysuria, foul-smelling urine, change in urinary frequency, fever, chills, nausea, vomiting or lower back pain.  Reviewed UA result.  Patient is afebrile with no leukocytosis. - will treat with ceftriaxone considering UTI.  Type 2 diabetes mellitus: -Started patient on sliding scale insulin.   Check A1c.  Monitor blood sugar closely.  GERD: Continue PPI  Anxiety: Continue Xanax   DVT prophylaxis: Lovenox Code Status: Full Family Communication: No family at bed side. Disposition Plan: Home/SNF in 2 to 3 days   Consultants:   Cardiology , EP  Procedures:   PPM 01/14/20 Antimicrobials:  Anti-infectives (From admission, onward)   Start     Dose/Rate Route Frequency Ordered Stop   01/14/20 1600  cefTRIAXone (ROCEPHIN) 1 g in sodium chloride 0.9 % 100 mL IVPB        1 g 200 mL/hr over 30 Minutes Intravenous Every 24 hours 01/14/20 0849     01/14/20 1230  gentamicin (GARAMYCIN) 80 mg in sodium chloride 0.9 % 500 mL irrigation  80 mg Irrigation On call 01/14/20 1137 01/15/20 1230   01/14/20 1230  ceFAZolin (ANCEF) IVPB 2g/100 mL premix        2 g 200 mL/hr over 30 Minutes Intravenous On call 01/14/20 1137 01/15/20 1230   01/13/20 1500  cefTRIAXone (ROCEPHIN) 1 g in sodium chloride 0.9 % 100 mL IVPB        1 g 200 mL/hr over 30 Minutes Intravenous  Once 01/13/20 1447 01/13/20 1916    Subjective: Patient was  seen and examined at bedside.  Overnight events noted.  Patient denies any urinary burning but reports some lower abdominal pain.  She denies chest pain, shortness of breath and dizziness.  Objective: Vitals:   01/14/20 0906 01/14/20 1130 01/14/20 1144 01/14/20 1226  BP:  (!) 165/47  (!) 140/95  Pulse: (!) 51 (!) 58 (!) 56 62  Resp:  (!) 32 16 18  Temp: 98.3 F (36.8 C) 98.2 F (36.8 C)  98.4 F (36.9 C)  TempSrc: Oral Oral  Oral  SpO2:  94%  96%  Weight:      Height:        Intake/Output Summary (Last 24 hours) at 01/14/2020 1418 Last data filed at 01/14/2020 0421 Gross per 24 hour  Intake 100 ml  Output 100 ml  Net 0 ml   Filed Weights   01/13/20 1200 01/13/20 2022 01/14/20 0421  Weight: 82.5 kg 81.2 kg 78.9 kg    Examination:  General exam: Appears calm and comfortable  Respiratory system: Clear to auscultation. Respiratory effort normal. Cardiovascular system: S1 & S2 heard, RRR. No JVD, murmurs, rubs, gallops or clicks. No pedal edema. Gastrointestinal system: Abdomen is nondistended, soft and nontender. No organomegaly or masses felt. Normal bowel sounds heard. Central nervous system: Alert and oriented. No focal neurological deficits. Extremities:  No edema, no cyanosis, no clubbing Skin: No rashes, lesions or ulcers Psychiatry: Judgement and insight appear normal. Mood & affect appropriate.     Data Reviewed: I have personally reviewed following labs and imaging studies  CBC: Recent Labs  Lab 01/13/20 1213 01/13/20 1226 01/13/20 1603 01/14/20 0338  WBC 10.0  --  10.2 12.2*  NEUTROABS 8.0*  --   --   --   HGB 12.5 13.9 13.6 13.5  HCT 41.0 41.0 44.1 42.4  MCV 86.3  --  85.0 85.8  PLT 167  --  PLATELET CLUMPS NOTED ON SMEAR, UNABLE TO ESTIMATE 465   Basic Metabolic Panel: Recent Labs  Lab 01/13/20 1213 01/13/20 1226 01/13/20 1603 01/14/20 0338  NA 134* 134*  --  136  K 4.8 4.7  --  4.5  CL 96*  --   --  98  CO2 28  --   --  29  GLUCOSE 323*  --    --  225*  BUN 15  --   --  24*  CREATININE 0.78  --  0.82 0.91  CALCIUM 9.0  --   --  9.0  MG  --   --  1.7  --   PHOS  --   --  3.4  --    GFR: Estimated Creatinine Clearance: 48.5 mL/min (by C-G formula based on SCr of 0.91 mg/dL). Liver Function Tests: Recent Labs  Lab 01/13/20 1213 01/14/20 0338  AST 20 23  ALT 15 16  ALKPHOS 50 50  BILITOT 0.8 0.8  PROT 7.1 6.5  ALBUMIN 3.4* 3.1*   No results for input(s): LIPASE, AMYLASE in the last 168 hours. No  results for input(s): AMMONIA in the last 168 hours. Coagulation Profile: No results for input(s): INR, PROTIME in the last 168 hours. Cardiac Enzymes: No results for input(s): CKTOTAL, CKMB, CKMBINDEX, TROPONINI in the last 168 hours. BNP (last 3 results) No results for input(s): PROBNP in the last 8760 hours. HbA1C: Recent Labs    01/14/20 0338  HGBA1C 9.8*   CBG: Recent Labs  Lab 01/14/20 0049 01/14/20 0359 01/14/20 0732 01/14/20 1206 01/14/20 1352  GLUCAP 187* 214* 224* 183* 173*   Lipid Profile: No results for input(s): CHOL, HDL, LDLCALC, TRIG, CHOLHDL, LDLDIRECT in the last 72 hours. Thyroid Function Tests: Recent Labs    01/13/20 1603  TSH 1.970   Anemia Panel: No results for input(s): VITAMINB12, FOLATE, FERRITIN, TIBC, IRON, RETICCTPCT in the last 72 hours. Sepsis Labs: Recent Labs  Lab 01/13/20 1213  LATICACIDVEN 1.9    Recent Results (from the past 240 hour(s))  SARS Coronavirus 2 by RT PCR (hospital order, performed in [facility] hospital lab) Nasopharyngeal Nasopharyngeal Swab     Status: None   Collection Time: 01/13/20  1:22 PM   Specimen: Nasopharyngeal Swab  Result Value Ref Range Status   SARS Coronavirus 2 NEGATIVE NEGATIVE Final    Comment: (NOTE) SARS-CoV-2 target nucleic acids are NOT DETECTED.  The SARS-CoV-2 RNA is generally detectable in upper and lower respiratory specimens during the acute phase of infection. The lowest concentration of SARS-CoV-2 viral copies this  assay can detect is 250 copies / mL. A negative result does not preclude SARS-CoV-2 infection and should not be used as the sole basis for treatment or other patient management decisions.  A negative result may occur with improper specimen collection / handling, submission of specimen other than nasopharyngeal swab, presence of viral mutation(s) within the areas targeted by this assay, and inadequate number of viral copies (<250 copies / mL). A negative result must be combined with clinical observations, patient history, and epidemiological information.  Fact Sheet for Patients:   StrictlyIdeas.no  Fact Sheet for Healthcare Providers: BankingDealers.co.za  This test is not yet approved or  cleared by the [place] FDA and has been authorized for detection and/or diagnosis of SARS-CoV-2 by FDA under an Emergency Use Authorization (EUA).  This EUA will remain in effect (meaning this test can be used) for the duration of the COVID-19 declaration under Section 564(b)(1) of the Act, 21 U.S.C. section 360bbb-3(b)(1), unless the authorization is terminated or revoked sooner.  Performed at [facility], [place] [address], [place], [place] [address]   Culture, blood (routine x 2)     Status: None (Preliminary result)   Collection Time: 01/13/20  4:03 PM   Specimen: BLOOD  Result Value Ref Range Status   Specimen Description BLOOD SITE NOT SPECIFIED  Final   Special Requests   Final    BOTTLES DRAWN AEROBIC AND ANAEROBIC Blood Culture results may not be optimal due to an inadequate volume of blood received in culture bottles   Culture   Final    NO GROWTH < 24 HOURS Performed at [facility], [place] [address] [place], [place] [address]    Report Status PENDING  Incomplete  Culture, blood (routine x 2)     Status: None (Preliminary result)   Collection Time: 01/13/20  4:04 PM   Specimen: BLOOD  Result Value Ref Range Status    Specimen Description BLOOD SITE NOT SPECIFIED  Final   Special Requests   Final    BOTTLES  DRAWN AEROBIC ONLY Blood Culture results may not be optimal due to an inadequate volume of blood received in culture bottles   Culture   Final    NO GROWTH < 24 HOURS Performed at [facility] [address], [place], [place] [address]    Report Status PENDING  Incomplete         Radiology Studies: CT Head Wo Contrast  Result Date: 01/13/2020 CLINICAL DATA:  Altered mental status EXAM: CT HEAD WITHOUT CONTRAST TECHNIQUE: Contiguous axial images were obtained from the base of the skull through the vertex without intravenous contrast. COMPARISON:  May 11, 2013 FINDINGS: Brain: Mild diffuse atrophy is stable. There is no intracranial mass, hemorrhage, extra-axial fluid collection, or midline shift. There is small vessel disease in the centra semiovale bilaterally, stable. There is a prior small lacunar infarct in the head of the caudate nucleus on the right. No acute appearing infarct is demonstrable. Vascular: No appreciable hyperdense vessel. There is calcification in each carotid siphon region. Skull: The bony calvarium appears intact. Sinuses/Orbits: There is mucosal thickening in several ethmoid air cells. Orbits appear symmetric bilaterally. Evidence of previous cataract removals. Other: Visualized mastoid air cells are clear. IMPRESSION: Stable atrophy with periventricular small vessel disease. Prior small infarct in the head of the caudate nucleus on the right. No acute infarct. No mass or hemorrhage. Foci of arterial vascular calcification noted. There is mucosal thickening in several ethmoid air cells. Electronically Signed   By: Lowella Grip III M.D.   On: 01/13/2020 13:54   DG Chest Portable 1 View  Result Date: 01/13/2020 CLINICAL DATA:  Hypoxia EXAM: PORTABLE CHEST 1 VIEW COMPARISON:  08/06/2019 FINDINGS: Cardiac shadow is stable. Aortic calcifications are again seen. The lungs are  well aerated bilaterally. Mild bibasilar atelectasis is noted. No sizable effusion is seen. No bony abnormality is noted. IMPRESSION: Mild bibasilar atelectasis.  No other focal abnormality is seen. Electronically Signed   By: Inez Catalina M.D.   On: 01/13/2020 12:21   ECHOCARDIOGRAM COMPLETE  Result Date: 01/13/2020    ECHOCARDIOGRAM REPORT   Patient Name:   KATE LAROCK Date of Exam: 01/13/2020 Medical Rec #:  [id number]        Height:       63.0 in Accession #:    [id number]       Weight:       181.9 lb Date of Birth:  18-Jun-1935        BSA:          1.857 m Patient Age:    52 years         BP:           183/76 mmHg Patient Gender: F                HR:           46 bpm. Exam Location:  Inpatient Procedure: 2D Echo, Cardiac Doppler and Color Doppler Indications:    Heartblock I45.9  History:        Patient has prior history of Echocardiogram examinations, most                 recent 05/12/2013. Risk Factors:Diabetes, Hypertension and GERD.  Sonographer:    Bernadene Person RDCS Referring Phys: [id number] [place]  Sonographer Comments: Technically difficult study due to poor echo windows. Image acquisition challenging due to uncooperative patient. Patient was unable to turn on her side or stay still. During the exam the  patient expressed that she wanted the test to be over and did not want it to continue even after explaining the importance of the exam. IMPRESSIONS  1. Left ventricular ejection fraction, by estimation, is 60 to 65%. The left ventricle has normal function. The left ventricle has no regional wall motion abnormalities. Left ventricular diastolic parameters are indeterminate.  2. Right ventricular systolic function is normal. The right ventricular size is normal.  3. The mitral valve is normal in structure. No evidence of mitral valve regurgitation. No evidence of mitral stenosis.  4. The aortic valve is tricuspid. Aortic valve regurgitation is not visualized. Mild to moderate aortic valve  sclerosis/calcification is present, without any evidence of aortic stenosis.  5. The inferior vena cava is normal in size with greater than 50% respiratory variability, suggesting right atrial pressure of 3 mmHg. FINDINGS  Left Ventricle: Left ventricular ejection fraction, by estimation, is 60 to 65%. The left ventricle has normal function. The left ventricle has no regional wall motion abnormalities. The left ventricular internal cavity size was normal in size. There is  no left ventricular hypertrophy. Left ventricular diastolic parameters are indeterminate. Right Ventricle: The right ventricular size is normal. No increase in right ventricular wall thickness. Right ventricular systolic function is normal. Left Atrium: Left atrial size was normal in size. Right Atrium: Right atrial size was normal in size. Pericardium: There is no evidence of pericardial effusion. Mitral Valve: The mitral valve is normal in structure. Normal mobility of the mitral valve leaflets. Mild to moderate mitral annular calcification. No evidence of mitral valve regurgitation. No evidence of mitral valve stenosis. Tricuspid Valve: The tricuspid valve is normal in structure. Tricuspid valve regurgitation is trivial. No evidence of tricuspid stenosis. Aortic Valve: The aortic valve is tricuspid. Aortic valve regurgitation is not visualized. Mild to moderate aortic valve sclerosis/calcification is present, without any evidence of aortic stenosis. Pulmonic Valve: The pulmonic valve was normal in structure. Pulmonic valve regurgitation is not visualized. No evidence of pulmonic stenosis. Aorta: The aortic root is normal in size and structure. Venous: The inferior vena cava is normal in size with greater than 50% respiratory variability, suggesting right atrial pressure of 3 mmHg. IAS/Shunts: No atrial level shunt detected by color flow Doppler.  LEFT VENTRICLE PLAX 2D LVIDd:         4.60 cm LVIDs:         2.80 cm LV PW:         0.80 cm LV IVS:         0.90 cm LVOT diam:     1.90 cm LV SV:         69 LV SV Index:   37 LVOT Area:     2.84 cm  RIGHT VENTRICLE TAPSE (M-mode): 2.0 cm LEFT ATRIUM             Index       RIGHT ATRIUM          Index LA diam:        3.50 cm 1.88 cm/m  RA Area:     8.44 cm LA Vol (A2C):   40.2 ml 21.65 ml/m RA Volume:   13.40 ml 7.22 ml/m LA Vol (A4C):   22.6 ml 12.17 ml/m LA Biplane Vol: 31.2 ml 16.80 ml/m  AORTIC VALVE LVOT Vmax:   100.00 cm/s LVOT Vmean:  73.300 cm/s LVOT VTI:    0.245 m  AORTA Ao Root diam: 3.30 cm Ao Asc diam:  3.40 cm  SHUNTS  Systemic VTI:  0.24 m Systemic Diam: 1.90 cm Fransico Him MD Electronically signed by Fransico Him MD Signature Date/Time: 01/13/2020/4:04:02 PM    Final    Scheduled Meds: . chlorhexidine  60 mL Topical Once  . enoxaparin (LOVENOX) injection  40 mg Subcutaneous Q24H  . gentamicin irrigation  80 mg Irrigation On Call  . insulin aspart  0-15 Units Subcutaneous TID WC  . insulin aspart  0-5 Units Subcutaneous QHS  . insulin glargine  20 Units Subcutaneous QHS   Continuous Infusions: . sodium chloride    . sodium chloride 50 mL/hr at 01/14/20 1411  . sodium chloride 50 mL/hr at 01/14/20 1408  .  ceFAZolin (ANCEF) IV    . cefTRIAXone (ROCEPHIN)  IV    . magnesium sulfate bolus IVPB 2 g (01/14/20 1410)     LOS: 1 day    Time spent: 35 mins.    Shawna Clamp, MD [facility]   If 7PM-7AM, please contact night-coverage

## 2020-01-14 NOTE — Significant Event (Addendum)
HOSPITAL MEDICINE OVERNIGHT EVENT NOTE    Notified by nursing patient complaining of shoulder pain. Denies chest pain.  Chart reviewed, S/P pacemaker placement earlier today.  Vitals stable.  Tele shows paced rhythm. ECG also reveals paced rhythm with sinus tach over 100BPM.  Discomfort likely musculoskeletal from recent procedure.    Opiate database reveals patient has received Vicodin in the past.  Will order this x 1.   Vernelle Emerald  MD [facility]

## 2020-01-14 NOTE — Evaluation (Signed)
Physical Therapy Evaluation Patient Details Name: [name] MRN: [id number] DOB: [birth year] Today's Date: 01/14/2020   History of Present Illness  84 y.o. female presenting with confusion, hypoxia with O2 sat 70% on RA, and bradycardia with EKG (+) third-degree AV block. PMHx significant for HTN, HLD, DM w/ neuropathy, chronic hypoxemic respiratory failure 2/2 underlying COPD on 2.5-3L O2 at baseline, obesity, and anxiety.   Clinical Impression  Pt admitted with above diagnosis. Pt was able to sit EOB and was able to stand to RW with mod assist of 2 to power up and min assist once on feet taking a few steps to [facility] only.  Had BM and PT cleaned pt.  Pt confused intermittently as well.  Will need SNF as pt states she has no assist at home. VSS.  Will follow acutely.  Pt currently with functional limitations due to the deficits listed below (see PT Problem List). Pt will benefit from skilled PT to increase their independence and safety with mobility to allow discharge to the venue listed below.      Follow Up Recommendations SNF;Supervision/Assistance - 24 hour    Equipment Recommendations  None recommended by PT    Recommendations for Other Services       Precautions / Restrictions Precautions Precautions: Fall Precaution Comments: High fall risk, seizure precautions, mitten restraints Restrictions Weight Bearing Restrictions: No      Mobility  Bed Mobility Overal bed mobility: Needs Assistance Bed Mobility: Rolling;Sidelying to Sit;Sit to Supine Rolling: Max assist Sidelying to sit: Max assist   Sit to supine: Max assist   General bed mobility comments: Maximal multimodal cues and increased time/effort for bed mobility with HOB flat.   Transfers Overall transfer level: Needs assistance Equipment used: Rolling walker (2 wheeled) Transfers: Sit to/from Stand Sit to Stand: Mod assist;From elevated surface;+2 safety/equipment         General transfer comment: Pt stood  to RW with mod assist and cues.  Took side steps up to [facility].  Ambulation/Gait Ambulation/Gait assistance: Min assist;+2 safety/equipment Gait Distance (Feet): 2 Feet Assistive device: Rolling walker (2 wheeled) Gait Pattern/deviations: Decreased step length - right;Decreased step length - left;Wide base of support;Leaning posteriorly   Gait velocity interpretation: <1.31 ft/sec, indicative of household ambulator General Gait Details: Took side steps to [facility].  BMnoted therefore cleaned pt and changed linens once back in bed.  Stairs            Wheelchair Mobility    Modified Rankin (Stroke Patients Only)       Balance Overall balance assessment: Needs assistance Sitting-balance support: Feet supported;Bilateral upper extremity supported Sitting balance-Leahy Scale: Poor Sitting balance - Comments: Initially Min A progressing to Min guard to maintain static sitting balance at EOB. Cues for hand/foot placement.able to sit EOB 10 min.    Standing balance support: Bilateral upper extremity supported;During functional activity Standing balance-Leahy Scale: Poor Standing balance comment: relies on UE support                             Pertinent Vitals/Pain Pain Assessment: Faces Faces Pain Scale: Hurts a little bit Pain Location: RUE Pain Descriptors / Indicators: Discomfort;Sore Pain Intervention(s): Limited activity within patient's tolerance;Monitored during session;Repositioned    Home Living Family/patient expects to be discharged to:: Skilled nursing facility Living Arrangements: Alone             Home Equipment: Gilford Rile - 2 wheels;Walker - 4 wheels;Bedside commode;Shower seat  Prior Function Level of Independence: Independent with assistive device(s)         Comments: Patient questionable historian. Reports independence to Mod I at baseline with use of DME. Occasional use of RW vs. rollator in home and community dwellings.      Hand  Dominance   Dominant Hand: Right    Extremity/Trunk Assessment   Upper Extremity Assessment Upper Extremity Assessment: Defer to OT evaluation    Lower Extremity Assessment Lower Extremity Assessment: Generalized weakness    Cervical / Trunk Assessment Cervical / Trunk Assessment: Kyphotic  Communication   Communication: Expressive difficulties  Cognition Arousal/Alertness: Awake/alert Behavior During Therapy: Anxious Overall Cognitive Status: Impaired/Different from baseline Area of Impairment: Orientation;Memory;Safety/judgement;Awareness;Problem solving                 Orientation Level: Disoriented to;Time;Situation (Oriented to name with cues for DOB. )   Memory: Decreased short-term memory   Safety/Judgement: Decreased awareness of safety Awareness: Intellectual Problem Solving: Slow processing;Difficulty sequencing;Requires verbal cues;Requires tactile cues        General Comments General comments (skin integrity, edema, etc.): Patient in mitten restraints 2/2 agitation overnight.    Exercises     Assessment/Plan    PT Assessment Patient needs continued PT services  PT Problem List Decreased activity tolerance;Decreased balance;Decreased mobility;Decreased knowledge of use of DME;Decreased safety awareness;Decreased knowledge of precautions       PT Treatment Interventions DME instruction;Gait training;Functional mobility training;Therapeutic activities;Therapeutic exercise;Balance training;Patient/family education    PT Goals (Current goals can be found in the Care Plan section)  Acute Rehab PT Goals Patient Stated Goal: To eat/drink PT Goal Formulation: With patient Time For Goal Achievement: 01/28/20 Potential to Achieve Goals: Good    Frequency Min 2X/week   Barriers to discharge Decreased caregiver support      Co-evaluation               AM-PAC PT "6 Clicks" Mobility  Outcome Measure Help needed turning from your back to your  side while in a flat bed without using bedrails?: A Lot Help needed moving from lying on your back to sitting on the side of a flat bed without using bedrails?: A Lot Help needed moving to and from a bed to a chair (including a wheelchair)?: A Lot Help needed standing up from a chair using your arms (e.g., wheelchair or bedside chair)?: A Lot Help needed to walk in hospital room?: A Lot Help needed climbing 3-5 steps with a railing? : A Lot 6 Click Score: 12    End of Session Equipment Utilized During Treatment: Gait belt;Oxygen Activity Tolerance: Patient limited by fatigue Patient left: in bed;with call bell/phone within reach;with bed alarm set;with restraints reapplied (mittens in place) Nurse Communication: Mobility status PT Visit Diagnosis: Unsteadiness on feet (R26.81);Muscle weakness (generalized) (M62.81)    Time: 4492-0100 PT Time Calculation (min) (ACUTE ONLY): 24 min   Charges:   PT Evaluation $PT Eval Moderate Complexity: 1 Mod PT Treatments $Therapeutic Activity: 8-22 mins        Alvia Jablonski W,PT Acute Rehabilitation Services Pager:  [phone]  Office:  [place] 01/14/2020, 12:35 PM

## 2020-01-14 NOTE — Progress Notes (Addendum)
Inpatient Diabetes Program Recommendations  AACE/ADA: New Consensus Statement on Inpatient Glycemic Control (2015)  Target Ranges:  Prepandial:   less than 140 mg/dL      Peak postprandial:   less than 180 mg/dL (1-2 hours)      Critically ill patients:  140 - 180 mg/dL   Lab Results  Component Value Date   GLUCAP 183 (H) 01/14/2020   HGBA1C 9.8 (H) 01/14/2020    Review of Glycemic Control Results for MAZEY, MANTELL (MRN [id number]) as of 01/14/2020 12:57  Ref. Range 01/13/2020 20:32 01/14/2020 00:49 01/14/2020 03:59 01/14/2020 07:32 01/14/2020 12:06  Glucose-Capillary Latest Ref Range: 70 - 99 mg/dL 234 (H) 187 (H) 214 (H) 224 (H) 183 (H)   Diabetes history: DM 2 Outpatient Diabetes medications:  Novolog 14-16 units with breakfast and lunch and Novolog 12-16 units with supper Tresiba 58 units daily Current orders for Inpatient glycemic control:  Novolog moderate tid with meals and HS  Inpatient Diabetes Program Recommendations:    May consider adding Lantus 20 units q HS (patient takes [place] 58 units daily at home).   A1C is improved from June 2021.    Thanks,  Adah Perl, RN, BC-ADM Inpatient Diabetes Coordinator Pager [phone] (8a-5p)

## 2020-01-15 ENCOUNTER — Encounter (HOSPITAL_COMMUNITY): Payer: Self-pay | Admitting: Internal Medicine

## 2020-01-15 ENCOUNTER — Inpatient Hospital Stay (HOSPITAL_COMMUNITY): Payer: Medicare Other

## 2020-01-15 LAB — CBC
HCT: 43 % (ref 36.0–46.0)
Hemoglobin: 12.9 g/dL (ref 12.0–15.0)
MCH: 26.4 pg (ref 26.0–34.0)
MCHC: 30 g/dL (ref 30.0–36.0)
MCV: 87.9 fL (ref 80.0–100.0)
Platelets: 162 10*3/uL (ref 150–400)
RBC: 4.89 MIL/uL (ref 3.87–5.11)
RDW: 15.9 % — ABNORMAL HIGH (ref 11.5–15.5)
WBC: 10.4 10*3/uL (ref 4.0–10.5)
nRBC: 0 % (ref 0.0–0.2)

## 2020-01-15 LAB — BASIC METABOLIC PANEL
Anion gap: 9 (ref 5–15)
BUN: 21 mg/dL (ref 8–23)
CO2: 30 mmol/L (ref 22–32)
Calcium: 8.7 mg/dL — ABNORMAL LOW (ref 8.9–10.3)
Chloride: 100 mmol/L (ref 98–111)
Creatinine, Ser: 0.72 mg/dL (ref 0.44–1.00)
GFR calc Af Amer: >60 mL/min (ref >60)
GFR calc non Af Amer: >60 mL/min (ref >60)
Glucose, Bld: 172 mg/dL — ABNORMAL HIGH (ref 70–99)
Potassium: 4.3 mmol/L (ref 3.5–5.1)
Sodium: 139 mmol/L (ref 135–145)

## 2020-01-15 LAB — GLUCOSE, CAPILLARY
Glucose-Capillary: 152 mg/dL — ABNORMAL HIGH (ref 70–99)
Glucose-Capillary: 156 mg/dL — ABNORMAL HIGH (ref 70–99)
Glucose-Capillary: 165 mg/dL — ABNORMAL HIGH (ref 70–99)
Glucose-Capillary: 167 mg/dL — ABNORMAL HIGH (ref 70–99)
Glucose-Capillary: 176 mg/dL — ABNORMAL HIGH (ref 70–99)
Glucose-Capillary: 185 mg/dL — ABNORMAL HIGH (ref 70–99)
Glucose-Capillary: 210 mg/dL — ABNORMAL HIGH (ref 70–99)

## 2020-01-15 LAB — MAGNESIUM: Magnesium: 2.3 mg/dL (ref 1.7–2.4)

## 2020-01-15 LAB — PHOSPHORUS: Phosphorus: 3.5 mg/dL (ref 2.5–4.6)

## 2020-01-15 MED ORDER — METOPROLOL SUCCINATE ER 50 MG PO TB24
50.0000 mg | ORAL_TABLET | Freq: Every day | ORAL | Status: DC
  Administered 2020-01-15 – 2020-01-16 (×2): 50 mg via ORAL
  Filled 2020-01-15 (×2): qty 1

## 2020-01-15 MED FILL — Lidocaine HCl Local Inj 1%: INTRAMUSCULAR | Qty: 20 | Status: AC

## 2020-01-15 MED FILL — Lidocaine HCl Local Inj 1%: INTRAMUSCULAR | Qty: 40 | Status: AC

## 2020-01-15 NOTE — Progress Notes (Signed)
PROGRESS NOTE    MARIAME RYBOLT  VWU:[id number] DOB: [birth year] DOA: 01/13/2020 PCP: No primary care provider on file.    Brief Narrative:  ABRIELLA FILKINS is an 84 y.o. female with medical history significant of hypertension, hyperlipidemia, diabetes mellitus, chronic hypoxemic respiratory failure secondary to underlying COPD-on 2.5 to 3 L of oxygen at home, obesity, GERD, diabetic neuropathy anxiety presents to emergency department with confusion. [place] was found to be bradycardic and EKG shows third-degree AV block after EMS arrived. Upon arrival to ED: Patient  Remains bradycardic in 40s, EKG shows complete heart block,  CT head came back negative for acute findings.  Cardiology consulted.  Patient continued to remain in heart block with symptoms.  Patient s/p permanent pacemaker placement yesterday , tolerated well. Patient is also found to have UTI, started on ceftriaxone.  Her altered mental status has improved.   Assessment & Plan:   Principal Problem:   Acute metabolic encephalopathy Active Problems:   COPD mixed type (HCC)   G E R D   Hypertension   Diabetic polyneuropathy associated with type 2 diabetes mellitus (HCC)   Complete heart block (HCC)   Acute on chronic respiratory failure with hypoxia (HCC)   Diabetes (HCC)   Elevated troponin   Pressure injury of skin   Acute metabolic encephalopathy:  >>> Improved. -Likely secondary to heart block & Hypoxia.  Patient bradycardic upon arrival.   EKG:  Complete Heart block,   Chest x-ray and CT head negative for acute findings.    Patient denies ACS symptoms.  COVID-19: Negative, lactic acid: WNL.    UA is positive for leukocyte, WBC, RBC however patient denies any urinary symptoms. -Admitted patient stepdown unit for close monitoring on telemetry. -EDP consulted, s/p PPM 01/14/20, tolerated well.  - Magnesium : normal. , TSH:  normal   -Monitor heart rate closely. -On fall precautions -Consult PT/OT/SLP  Complete  heart block: - Reviewed home medications.  Takes metoprolol at home.  Hold for now.  Reviewed EKG. - Cardio : s/p PPM 01/14/20, tolerated well.  -Transthoracic echo : LVEF 60-65%  Acute on chronic hypoxemic respiratory failure secondary to COPD:   No wheezing noted on exam.  Chest x-ray negative for infection, COVID-19 negative  On continuous pulse ox.  We will try to wean off of oxygen as tolerated. -Continue home inhalers.  Elevated troponin: Patient denies ACS symptoms. -Initial troponin 312- 356  -Reviewed EKG.  Asymptomatic bacteriuria: -Patient denies any urinary symptoms including dysuria, foul-smelling urine, change in urinary frequency, fever, chills, nausea, vomiting or lower back pain.  Reviewed UA result.  Patient is afebrile with no leukocytosis. - will treat with ceftriaxone considering UTI. - Urine culture grew gram - rods, sensitivity pending.  Type 2 diabetes mellitus: -Started patient on sliding scale insulin.   Check A1c.  Monitor blood sugar closely.  GERD: Continue PPI  Anxiety: Continue Xanax   DVT prophylaxis: Lovenox Code Status: Full Family Communication: No family at bed side. Disposition Plan: Home/SNF in 2 to 3 days.  PT recommended SNF but patient refused to go to SNF.  She wants to go home with home services.   Consultants:   Cardiology , EP  Procedures:   PPM 01/14/20 Antimicrobials:  Anti-infectives (From admission, onward)   Start     Dose/Rate Route Frequency Ordered Stop   01/14/20 2130  ceFAZolin (ANCEF) IVPB 1 g/50 mL premix  Status:  Discontinued        1 g 100 mL/hr over  30 Minutes Intravenous Every 6 hours 01/14/20 1654 01/15/20 0823   01/14/20 1600  cefTRIAXone (ROCEPHIN) 1 g in sodium chloride 0.9 % 100 mL IVPB        1 g 200 mL/hr over 30 Minutes Intravenous Every 24 hours 01/14/20 0849     01/14/20 1230  gentamicin (GARAMYCIN) 80 mg in sodium chloride 0.9 % 500 mL irrigation        80 mg Irrigation On call 01/14/20 1137  01/14/20 1615   01/14/20 1230  ceFAZolin (ANCEF) IVPB 2g/100 mL premix        2 g 200 mL/hr over 30 Minutes Intravenous On call 01/14/20 1137 01/14/20 1559   01/13/20 1500  cefTRIAXone (ROCEPHIN) 1 g in sodium chloride 0.9 % 100 mL IVPB        1 g 200 mL/hr over 30 Minutes Intravenous  Once 01/13/20 1447 01/13/20 1916    Subjective: Patient was seen and examined at bedside.  Overnight events noted.  Patient underwent PPM placement yesterday tolerated well.   She denies chest pain, shortness of breath and dizziness.  She reports having right shoulder pain, seems arthritic.  Objective: Vitals:   01/15/20 0400 01/15/20 0742 01/15/20 1100 01/15/20 1133  BP: (!) 141/62 (!) 161/74 (!) 150/72 (!) 150/72  Pulse: 85  88   Resp: 15   18  Temp: 97.6 F (36.4 C) 98.3 F (36.8 C)  98.4 F (36.9 C)  TempSrc:  Oral  Oral  SpO2: 96%  94% 94%  Weight:      Height:        Intake/Output Summary (Last 24 hours) at 01/15/2020 1313 Last data filed at 01/15/2020 1136 Gross per 24 hour  Intake 1010 ml  Output 775 ml  Net 235 ml   Filed Weights   01/13/20 2022 01/14/20 0421 01/15/20 0043  Weight: 81.2 kg 78.9 kg 80.3 kg    Examination:  General exam: Appears calm and comfortable  Respiratory system: Clear to auscultation. Respiratory effort normal. Cardiovascular system: S1 & S2 heard, RRR. No JVD, murmurs, rubs, gallops or clicks. No pedal edema. PPM noted.  Gastrointestinal system: Abdomen is nondistended, soft and nontender. No organomegaly or masses felt. Normal bowel sounds heard. Central nervous system: Alert and oriented. No focal neurological deficits. Extremities:   Right shoulder tenderness noted, ROM: limited.  No edema, no cyanosis, no clubbing Skin: No rashes, lesions or ulcers Psychiatry: Judgement and insight appear normal. Mood & affect appropriate.     Data Reviewed: I have personally reviewed following labs and imaging studies  CBC: Recent Labs  Lab 01/13/20 1213  01/13/20 1226 01/13/20 1603 01/14/20 0338 01/15/20 0708  WBC 10.0  --  10.2 12.2* 10.4  NEUTROABS 8.0*  --   --   --   --   HGB 12.5 13.9 13.6 13.5 12.9  HCT 41.0 41.0 44.1 42.4 43.0  MCV 86.3  --  85.0 85.8 87.9  PLT 167  --  PLATELET CLUMPS NOTED ON SMEAR, UNABLE TO ESTIMATE 176 563   Basic Metabolic Panel: Recent Labs  Lab 01/13/20 1213 01/13/20 1226 01/13/20 1603 01/14/20 0338 01/15/20 0708  NA 134* 134*  --  136 139  K 4.8 4.7  --  4.5 4.3  CL 96*  --   --  98 100  CO2 28  --   --  29 30  GLUCOSE 323*  --   --  225* 172*  BUN 15  --   --  24* 21  CREATININE  0.78  --  0.82 0.91 0.72  CALCIUM 9.0  --   --  9.0 8.7*  MG  --   --  1.7  --  2.3  PHOS  --   --  3.4  --  3.5   GFR: Estimated Creatinine Clearance: 55.5 mL/min (by C-G formula based on SCr of 0.72 mg/dL). Liver Function Tests: Recent Labs  Lab 01/13/20 1213 01/14/20 0338  AST 20 23  ALT 15 16  ALKPHOS 50 50  BILITOT 0.8 0.8  PROT 7.1 6.5  ALBUMIN 3.4* 3.1*   No results for input(s): LIPASE, AMYLASE in the last 168 hours. No results for input(s): AMMONIA in the last 168 hours. Coagulation Profile: No results for input(s): INR, PROTIME in the last 168 hours. Cardiac Enzymes: No results for input(s): CKTOTAL, CKMB, CKMBINDEX, TROPONINI in the last 168 hours. BNP (last 3 results) No results for input(s): PROBNP in the last 8760 hours. HbA1C: Recent Labs    01/14/20 0338  HGBA1C 9.8*   CBG: Recent Labs  Lab 01/15/20 0039 01/15/20 0425 01/15/20 0611 01/15/20 0715 01/15/20 1129  GLUCAP 165* 156* 152* 167* 176*   Lipid Profile: No results for input(s): CHOL, HDL, LDLCALC, TRIG, CHOLHDL, LDLDIRECT in the last 72 hours. Thyroid Function Tests: Recent Labs    01/13/20 1603  TSH 1.970   Anemia Panel: No results for input(s): VITAMINB12, FOLATE, FERRITIN, TIBC, IRON, RETICCTPCT in the last 72 hours. Sepsis Labs: Recent Labs  Lab 01/13/20 1213  LATICACIDVEN 1.9    Recent Results  (from the past 240 hour(s))  Urine culture     Status: Abnormal (Preliminary result)   Collection Time: 01/13/20 12:42 PM   Specimen: Urine, Catheterized  Result Value Ref Range Status   Specimen Description URINE, CATHETERIZED  Final   Special Requests NONE  Final   Culture (A)  Final    >=100,000 COLONIES/mL GRAM NEGATIVE RODS IDENTIFICATION AND SUSCEPTIBILITIES TO FOLLOW >=100,000 COLONIES/mL LACTOBACILLUS SPECIES Standardized susceptibility testing for this organism is not available. Performed at [facility], [place] [address], [place], [place] [address]    Report Status PENDING  Incomplete  SARS Coronavirus 2 by RT PCR (hospital order, performed in [facility] hospital lab) Nasopharyngeal Nasopharyngeal Swab     Status: None   Collection Time: 01/13/20  1:22 PM   Specimen: Nasopharyngeal Swab  Result Value Ref Range Status   SARS Coronavirus 2 NEGATIVE NEGATIVE Final    Comment: (NOTE) SARS-CoV-2 target nucleic acids are NOT DETECTED.  The SARS-CoV-2 RNA is generally detectable in upper and lower respiratory specimens during the acute phase of infection. The lowest concentration of SARS-CoV-2 viral copies this assay can detect is 250 copies / mL. A negative result does not preclude SARS-CoV-2 infection and should not be used as the sole basis for treatment or other patient management decisions.  A negative result may occur with improper specimen collection / handling, submission of specimen other than nasopharyngeal swab, presence of viral mutation(s) within the areas targeted by this assay, and inadequate number of viral copies (<250 copies / mL). A negative result must be combined with clinical observations, patient history, and epidemiological information.  Fact Sheet for Patients:   StrictlyIdeas.no  Fact Sheet for Healthcare Providers: BankingDealers.co.za  This test is not yet approved or  cleared by the [place] FDA and has been authorized for detection and/or diagnosis of SARS-CoV-2 by FDA under an Emergency Use Authorization (EUA).  This EUA will remain in effect (meaning this  test can be used) for the duration of the COVID-19 declaration under Section 564(b)(1) of the Act, 21 U.S.C. section 360bbb-3(b)(1), unless the authorization is terminated or revoked sooner.  Performed at [facility], [place] [address], [place], [place] [address]   Culture, blood (routine x 2)     Status: None (Preliminary result)   Collection Time: 01/13/20  4:03 PM   Specimen: BLOOD  Result Value Ref Range Status   Specimen Description BLOOD SITE NOT SPECIFIED  Final   Special Requests   Final    BOTTLES DRAWN AEROBIC AND ANAEROBIC Blood Culture results may not be optimal due to an inadequate volume of blood received in culture bottles   Culture   Final    NO GROWTH 2 DAYS Performed at [facility], [place] [address], [place], [place] [address]    Report Status PENDING  Incomplete  Culture, blood (routine x 2)     Status: None (Preliminary result)   Collection Time: 01/13/20  4:04 PM   Specimen: BLOOD  Result Value Ref Range Status   Specimen Description BLOOD SITE NOT SPECIFIED  Final   Special Requests   Final    BOTTLES DRAWN AEROBIC ONLY Blood Culture results may not be optimal due to an inadequate volume of blood received in culture bottles   Culture   Final    NO GROWTH 2 DAYS Performed at [facility], [place] [address] [place], [place] [address]    Report Status PENDING  Incomplete  Surgical PCR screen     Status: None   Collection Time: 01/14/20  2:39 PM   Specimen: Nasal Mucosa; Nasal Swab  Result Value Ref Range Status   MRSA, PCR NEGATIVE NEGATIVE Final   Staphylococcus aureus NEGATIVE NEGATIVE Final    Comment: (NOTE) The Xpert SA Assay (FDA approved for NASAL specimens in patients 33 years of age and older), is one component of a comprehensive surveillance program. It is  not intended to diagnose infection nor to guide or monitor treatment. Performed at [facility], [place] [address], [place], [place] [address]          Radiology Studies: DG Chest 2 View  Result Date: 01/15/2020 CLINICAL DATA:  Pacemaker. EXAM: CHEST - 2 VIEW COMPARISON:  01/13/2020. FINDINGS: Cardiac pacer with lead tips over the right atrium and right ventricle. Borderline cardiomegaly and pulmonary venous congestion. No evidence of pulmonary edema. Bibasilar atelectasis. Tiny right pleural effusion cannot be excluded. Surgical clips noted over the lower neck. IMPRESSION: 1. Cardiac pacer with lead tips over the right atrium right ventricle. Borderline cardiomegaly and pulmonary venous congestion. No evidence of pulmonary edema. 2. Bibasilar atelectasis. Tiny right pleural effusion cannot be excluded. Electronically Signed   By: Marcello Moores  Register   On: 01/15/2020 06:00   CT Head Wo Contrast  Result Date: 01/13/2020 CLINICAL DATA:  Altered mental status EXAM: CT HEAD WITHOUT CONTRAST TECHNIQUE: Contiguous axial images were obtained from the base of the skull through the vertex without intravenous contrast. COMPARISON:  May 11, 2013 FINDINGS: Brain: Mild diffuse atrophy is stable. There is no intracranial mass, hemorrhage, extra-axial fluid collection, or midline shift. There is small vessel disease in the centra semiovale bilaterally, stable. There is a prior small lacunar infarct in the head of the caudate nucleus on the right. No acute appearing infarct is demonstrable. Vascular: No appreciable hyperdense vessel. There is calcification in each carotid siphon region. Skull: The bony calvarium appears intact. Sinuses/Orbits: There is  mucosal thickening in several ethmoid air cells. Orbits appear symmetric bilaterally. Evidence of previous cataract removals. Other: Visualized mastoid air cells are clear. IMPRESSION: Stable atrophy with periventricular small vessel disease. Prior small infarct in  the head of the caudate nucleus on the right. No acute infarct. No mass or hemorrhage. Foci of arterial vascular calcification noted. There is mucosal thickening in several ethmoid air cells. Electronically Signed   By: Lowella Grip III M.D.   On: 01/13/2020 13:54   EP PPM/ICD IMPLANT  Result Date: 01/14/2020 CONCLUSIONS:  1. Successful implantation of a [employer]. Jude dual-chamber pacemaker for symptomatic bradycardia due to complete heart block  2. No early apparent complications.       Cristopher Peru, MD 01/14/2020 5:58 PM   ECHOCARDIOGRAM COMPLETE  Result Date: 01/13/2020    ECHOCARDIOGRAM REPORT   Patient Name:   PADDY WALTHALL Date of Exam: 01/13/2020 Medical Rec #:  [id number]        Height:       63.0 in Accession #:    [id number]       Weight:       181.9 lb Date of Birth:  1936-01-21        BSA:          1.857 m Patient Age:    69 years         BP:           183/76 mmHg Patient Gender: F                HR:           46 bpm. Exam Location:  Inpatient Procedure: 2D Echo, Cardiac Doppler and Color Doppler Indications:    Heartblock I45.9  History:        Patient has prior history of Echocardiogram examinations, most                 recent 05/12/2013. Risk Factors:Diabetes, Hypertension and GERD.  Sonographer:    Bernadene Person RDCS Referring Phys: [id number] [place]  Sonographer Comments: Technically difficult study due to poor echo windows. Image acquisition challenging due to uncooperative patient. Patient was unable to turn on her side or stay still. During the exam the patient expressed that she wanted the test to be over and did not want it to continue even after explaining the importance of the exam. IMPRESSIONS  1. Left ventricular ejection fraction, by estimation, is 60 to 65%. The left ventricle has normal function. The left ventricle has no regional wall motion abnormalities. Left ventricular diastolic parameters are indeterminate.  2. Right ventricular systolic function is normal. The  right ventricular size is normal.  3. The mitral valve is normal in structure. No evidence of mitral valve regurgitation. No evidence of mitral stenosis.  4. The aortic valve is tricuspid. Aortic valve regurgitation is not visualized. Mild to moderate aortic valve sclerosis/calcification is present, without any evidence of aortic stenosis.  5. The inferior vena cava is normal in size with greater than 50% respiratory variability, suggesting right atrial pressure of 3 mmHg. FINDINGS  Left Ventricle: Left ventricular ejection fraction, by estimation, is 60 to 65%. The left ventricle has normal function. The left ventricle has no regional wall motion abnormalities. The left ventricular internal cavity size was normal in size. There is  no left ventricular hypertrophy. Left ventricular diastolic parameters are indeterminate. Right Ventricle: The right ventricular size is normal. No increase in right ventricular wall thickness. Right ventricular systolic function is  normal. Left Atrium: Left atrial size was normal in size. Right Atrium: Right atrial size was normal in size. Pericardium: There is no evidence of pericardial effusion. Mitral Valve: The mitral valve is normal in structure. Normal mobility of the mitral valve leaflets. Mild to moderate mitral annular calcification. No evidence of mitral valve regurgitation. No evidence of mitral valve stenosis. Tricuspid Valve: The tricuspid valve is normal in structure. Tricuspid valve regurgitation is trivial. No evidence of tricuspid stenosis. Aortic Valve: The aortic valve is tricuspid. Aortic valve regurgitation is not visualized. Mild to moderate aortic valve sclerosis/calcification is present, without any evidence of aortic stenosis. Pulmonic Valve: The pulmonic valve was normal in structure. Pulmonic valve regurgitation is not visualized. No evidence of pulmonic stenosis. Aorta: The aortic root is normal in size and structure. Venous: The inferior vena cava is normal  in size with greater than 50% respiratory variability, suggesting right atrial pressure of 3 mmHg. IAS/Shunts: No atrial level shunt detected by color flow Doppler.  LEFT VENTRICLE PLAX 2D LVIDd:         4.60 cm LVIDs:         2.80 cm LV PW:         0.80 cm LV IVS:        0.90 cm LVOT diam:     1.90 cm LV SV:         69 LV SV Index:   37 LVOT Area:     2.84 cm  RIGHT VENTRICLE TAPSE (M-mode): 2.0 cm LEFT ATRIUM             Index       RIGHT ATRIUM          Index LA diam:        3.50 cm 1.88 cm/m  RA Area:     8.44 cm LA Vol (A2C):   40.2 ml 21.65 ml/m RA Volume:   13.40 ml 7.22 ml/m LA Vol (A4C):   22.6 ml 12.17 ml/m LA Biplane Vol: 31.2 ml 16.80 ml/m  AORTIC VALVE LVOT Vmax:   100.00 cm/s LVOT Vmean:  73.300 cm/s LVOT VTI:    0.245 m  AORTA Ao Root diam: 3.30 cm Ao Asc diam:  3.40 cm  SHUNTS Systemic VTI:  0.24 m Systemic Diam: 1.90 cm Fransico Him MD Electronically signed by Fransico Him MD Signature Date/Time: 01/13/2020/4:04:02 PM    Final    Scheduled Meds: . insulin aspart  0-15 Units Subcutaneous TID WC  . insulin aspart  0-5 Units Subcutaneous QHS  . insulin glargine  20 Units Subcutaneous QHS  . metoprolol succinate  50 mg Oral Daily   Continuous Infusions: . sodium chloride    . cefTRIAXone (ROCEPHIN)  IV       LOS: 2 days    Time spent: 25 mins.    Shawna Clamp, MD [facility]   If 7PM-7AM, please contact night-coverage

## 2020-01-15 NOTE — Discharge Instructions (Signed)
After Your Pacemaker  ACTIVITY . Do not lift your arm above shoulder height for 1 week after your procedure. After 7 days, you may progress as below.     Thursday January 22, 2020  Friday January 23, 2020 Saturday January 24, 2020 Sunday January 25, 2020   . Do not lift, push, pull, or carry anything over 10 pounds with the affected arm until 6 weeks (Thursday February 26, 2020 ) after your procedure.   . Do NOT DRIVE until you have been seen for your wound check, or as long as instructed by your healthcare provider.   . Ask your healthcare provider when you can go back to work   INCISION/Dressing . If you are on a blood thinner such as Coumadin, Xarelto, Eliquis, Plavix, or Pradaxa please confirm with your provider when this should be resumed.   . Monitor your Pacemaker site for redness, swelling, and drainage. Call the device clinic at [phone] if you experience these symptoms or fever/chills.  . If your incision is sealed with Steri-strips or staples, you may shower 10 days after your procedure or when told by your provider. Do not remove the steri-strips or let the shower hit directly on your site. You may wash around your site with soap and water.    Marland Kitchen Avoid lotions, ointments, or perfumes over your incision until it is well-healed.  . You may use a hot tub or a pool AFTER your wound check appointment if the incision is completely closed.  Marland Kitchen PAcemaker Alerts:  Some alerts are vibratory and others beep. These are NOT emergencies. Please call our office to let us know. If this occurs at night or on weekends, it can wait until the next business day. Send a remote transmission.  . If your device is capable of reading fluid status (for heart failure), you will be offered monthly monitoring to review this with you.   DEVICE MANAGEMENT . Remote monitoring is used to monitor your pacemaker from home. This monitoring is scheduled every 91 days by our office. It allows [place] to keep  an eye on the functioning of your device to ensure it is working properly. You will routinely see your Electrophysiologist annually (more often if necessary).   . You should receive your ID card for your new device in 4-8 weeks. Keep this card with you at all times once received. Consider wearing a medical alert bracelet or necklace.  . Your Pacemaker may be MRI compatible. This will be discussed at your next office visit/wound check.  You should avoid contact with strong electric or magnetic fields.    Do not use amateur (ham) radio equipment or electric (arc) welding torches. MP3 player headphones with magnets should not be used. Some devices are safe to use if held at least 12 inches (30 cm) from your Pacemaker. These include power tools, lawn mowers, and speakers. If you are unsure if something is safe to use, ask your health care provider.   When using your cell phone, hold it to the ear that is on the opposite side from the Pacemaker. Do not leave your cell phone in a pocket over the Pacemaker.   You may safely use electric blankets, heating pads, computers, and microwave ovens.  Call the office right away if:  You have chest pain.  You feel more short of breath than you have felt before.  You feel more light-headed than you have felt before.  Your incision starts to open up.  This information  is not intended to replace advice given to you by your health care provider. Make sure you discuss any questions you have with your health care provider.

## 2020-01-15 NOTE — Progress Notes (Signed)
  Speech Language Pathology Treatment: Dysphagia  Patient Details Name: [name] MRN: [id number] DOB: [birth year] Today's Date: 01/15/2020 Time: 8329-1916 SLP Time Calculation (min) (ACUTE ONLY): 15 min  Assessment / Plan / Recommendation Clinical Impression  Pt was seen for dysphagia treatment and was cooperative throughout the session. Pt and nursing reported that the pt has been tolerating the current diet without overt s/sx of aspiration. Pt tolerated  regular texture solids, and thin liquids via straw using consecutive swallows without symptoms of oropharyngeal dysphagia. It is recommended that the current diet be continued. Further skilled SLP services are not clinically indicated at this time.    HPI HPI: Pt is an 84 y.o. female with medical history significant of hypertension, hyperlipidemia, diabetes mellitus, chronic hypoxemic respiratory failure secondary to underlying COPD-on 2.5 to 3 L of oxygen at home, obesity, GERD, diabetic neuropathy anxiety presents to emergency department with confusion. CT head was negative for acute changes. CXR 9/7: Mild bibasilar atelectasis without other focal abnormality.Pt is an 84 y.o. female with medical history significant of hypertension, hyperlipidemia, diabetes mellitus, chronic hypoxemic respiratory failure secondary to underlying COPD-on 2.5 to 3 L of oxygen at home, obesity, GERD, diabetic neuropathy anxiety presents to emergency department with confusion  hypoxia with O2 sat 70% on RA, and bradycardia with EKG (+) third-degree AV block. CT head was negative for acute changes. CXR 9/7: Mild bibasilar atelectasis without other focal abnormality.      SLP Plan  Discharge SLP treatment due to (comment);All goals met       Recommendations  Diet recommendations: Regular;Thin liquid Liquids provided via: Cup;Straw Medication Administration: Whole meds with puree Supervision: Patient able to self feed;Staff to assist with self  feeding Compensations: Small sips/bites Postural Changes and/or Swallow Maneuvers: Seated upright 90 degrees                Oral Care Recommendations: Oral care BID Follow up Recommendations: None SLP Visit Diagnosis: Dysphagia, unspecified (R13.10) Plan: Discharge SLP treatment due to (comment);All goals met       Esther Bradstreet I. Hardin Negus, [place], [place] Office number [phone] Pager [place] 01/15/2020, 12:37 PM

## 2020-01-15 NOTE — TOC Progression Note (Signed)
Transition of Care [facility]) - Progression Note    Patient Details  Name: [name] MRN: [id number] Date of Birth: [birth year]  Transition of [facility]) CM/SW Contact  Zenon Mayo, RN Phone Number: 01/15/2020, 4:13 PM  Clinical Narrative:    NCM spoke with patient , offered choice for [facility] services, she states she does not want [place] services, she has a private duty person, Nelida Gores that comes over 2 or 3 times per week to check in on her and assist her.  She states she has a walker , cane, and oxygen by [employer] at home.  She states Dr. Baird Lyons is her lung doctor who she sees. She states she is not going to do any therapy no matter what anyone says.         Expected Discharge Plan and Services                                                 Social Determinants of Health (SDOH) Interventions    Readmission Risk Interventions No flowsheet data found.

## 2020-01-15 NOTE — Progress Notes (Addendum)
Electrophysiology Rounding Note  Patient Name: [name] Date of Encounter: 01/15/2020  Primary Cardiologist: No primary care provider on file. Electrophysiologist: New to Dr. Lovena Le   Subjective   The patient is doing well today.  Complaining of RIGHT shoulder pain.  Cites prior surgery, suspect arthritic, related to lying in bed/table for procedure.   Inpatient Medications    Scheduled Meds:  insulin aspart  0-15 Units Subcutaneous TID WC   insulin aspart  0-5 Units Subcutaneous QHS   insulin glargine  20 Units Subcutaneous QHS   metoprolol succinate  50 mg Oral Daily   Continuous Infusions:  sodium chloride      ceFAZolin (ANCEF) IV 1 g (01/15/20 0312)   cefTRIAXone (ROCEPHIN)  IV     PRN Meds: acetaminophen, ondansetron (ZOFRAN) IV, ondansetron **OR** [DISCONTINUED] ondansetron (ZOFRAN) IV   Vital Signs    Vitals:   01/15/20 0000 01/15/20 0043 01/15/20 0400 01/15/20 0742  BP: 136/63  (!) 141/62 (!) 161/74  Pulse: 93  85   Resp: 19  15   Temp: 97.7 F (36.5 C)  97.6 F (36.4 C) 98.3 F (36.8 C)  TempSrc:    Oral  SpO2: 95%  96%   Weight:  80.3 kg    Height:        Intake/Output Summary (Last 24 hours) at 01/15/2020 0821 Last data filed at 01/15/2020 0816 Gross per 24 hour  Intake 890 ml  Output 550 ml  Net 340 ml   Filed Weights   01/13/20 2022 01/14/20 0421 01/15/20 0043  Weight: 81.2 kg 78.9 kg 80.3 kg    Physical Exam    GEN- The patient is elderly appearing, alert and oriented x 3 today.   Head- normocephalic, atraumatic Eyes-  Sclera clear, conjunctiva pink Ears- hearing intact Oropharynx- clear Neck- supple Lungs- Clear to ausculation bilaterally, normal work of breathing Heart- Regular rate and rhythm, no murmurs, rubs or gallops GI- soft, NT, ND, + BS Extremities- no clubbing or cyanosis. No edema Skin- no rash or lesion Psych- euthymic mood, full affect Neuro- strength and sensation are intact  Labs    CBC Recent Labs     01/13/20 1213 01/13/20 1226 01/14/20 0338 01/15/20 0708  WBC 10.0   < > 12.2* 10.4  NEUTROABS 8.0*  --   --   --   HGB 12.5   < > 13.5 12.9  HCT 41.0   < > 42.4 43.0  MCV 86.3   < > 85.8 87.9  PLT 167   < > 176 162   < > = values in this interval not displayed.   Basic Metabolic Panel Recent Labs    01/13/20 1213 01/13/20 1603 01/14/20 0338 01/15/20 0708  NA   < >  --  136 139  K   < >  --  4.5 4.3  CL   < >  --  98 100  CO2   < >  --  29 30  GLUCOSE   < >  --  225* 172*  BUN   < >  --  24* 21  CREATININE   < > 0.82 0.91 0.72  CALCIUM   < >  --  9.0 8.7*  MG  --  1.7  --  2.3  PHOS  --  3.4  --  3.5   < > = values in this interval not displayed.   Liver Function Tests Recent Labs    01/13/20 1213 01/14/20 0338  AST  20 23  ALT 15 16  ALKPHOS 50 50  BILITOT 0.8 0.8  PROT 7.1 6.5  ALBUMIN 3.4* 3.1*   No results for input(s): LIPASE, AMYLASE in the last 72 hours. Cardiac Enzymes No results for input(s): CKTOTAL, CKMB, CKMBINDEX, TROPONINI in the last 72 hours.   Telemetry    V pacing 100-110s (personally reviewed)  Radiology    DG Chest 2 View  Result Date: 01/15/2020 CLINICAL DATA:  Pacemaker. EXAM: CHEST - 2 VIEW COMPARISON:  01/13/2020. FINDINGS: Cardiac pacer with lead tips over the right atrium and right ventricle. Borderline cardiomegaly and pulmonary venous congestion. No evidence of pulmonary edema. Bibasilar atelectasis. Tiny right pleural effusion cannot be excluded. Surgical clips noted over the lower neck. IMPRESSION: 1. Cardiac pacer with lead tips over the right atrium right ventricle. Borderline cardiomegaly and pulmonary venous congestion. No evidence of pulmonary edema. 2. Bibasilar atelectasis. Tiny right pleural effusion cannot be excluded. Electronically Signed   By: Marcello Moores  Register   On: 01/15/2020 06:00   CT Head Wo Contrast  Result Date: 01/13/2020 CLINICAL DATA:  Altered mental status EXAM: CT HEAD WITHOUT CONTRAST TECHNIQUE: Contiguous  axial images were obtained from the base of the skull through the vertex without intravenous contrast. COMPARISON:  May 11, 2013 FINDINGS: Brain: Mild diffuse atrophy is stable. There is no intracranial mass, hemorrhage, extra-axial fluid collection, or midline shift. There is small vessel disease in the centra semiovale bilaterally, stable. There is a prior small lacunar infarct in the head of the caudate nucleus on the right. No acute appearing infarct is demonstrable. Vascular: No appreciable hyperdense vessel. There is calcification in each carotid siphon region. Skull: The bony calvarium appears intact. Sinuses/Orbits: There is mucosal thickening in several ethmoid air cells. Orbits appear symmetric bilaterally. Evidence of previous cataract removals. Other: Visualized mastoid air cells are clear. IMPRESSION: Stable atrophy with periventricular small vessel disease. Prior small infarct in the head of the caudate nucleus on the right. No acute infarct. No mass or hemorrhage. Foci of arterial vascular calcification noted. There is mucosal thickening in several ethmoid air cells. Electronically Signed   By: Lowella Grip III M.D.   On: 01/13/2020 13:54   EP PPM/ICD IMPLANT  Result Date: 01/14/2020 CONCLUSIONS:  1. Successful implantation of a St. Jude dual-chamber pacemaker for symptomatic bradycardia due to complete heart block  2. No early apparent complications.       Cristopher Peru, MD 01/14/2020 5:58 PM   DG Chest Portable 1 View  Result Date: 01/13/2020 CLINICAL DATA:  Hypoxia EXAM: PORTABLE CHEST 1 VIEW COMPARISON:  08/06/2019 FINDINGS: Cardiac shadow is stable. Aortic calcifications are again seen. The lungs are well aerated bilaterally. Mild bibasilar atelectasis is noted. No sizable effusion is seen. No bony abnormality is noted. IMPRESSION: Mild bibasilar atelectasis.  No other focal abnormality is seen. Electronically Signed   By: Inez Catalina M.D.   On: 01/13/2020 12:21   ECHOCARDIOGRAM  COMPLETE  Result Date: 01/13/2020    ECHOCARDIOGRAM REPORT   Patient Name:   [name] Date of Exam: 01/13/2020 Medical Rec #:  [id number]        Height:       63.0 in Accession #:    [id number]       Weight:       181.9 lb Date of Birth:  [birth year]        BSA:          1.857 m Patient Age:    84 years  BP:           183/76 mmHg Patient Gender: F                HR:           46 bpm. Exam Location:  Inpatient Procedure: 2D Echo, Cardiac Doppler and Color Doppler Indications:    Heartblock I45.9  History:        Patient has prior history of Echocardiogram examinations, most                 recent 05/12/2013. Risk Factors:Diabetes, Hypertension and GERD.  Sonographer:    Bernadene Person RDCS Referring Phys: [id number] [place]  Sonographer Comments: Technically difficult study due to poor echo windows. Image acquisition challenging due to uncooperative patient. Patient was unable to turn on her side or stay still. During the exam the patient expressed that she wanted the test to be over and did not want it to continue even after explaining the importance of the exam. IMPRESSIONS  1. Left ventricular ejection fraction, by estimation, is 60 to 65%. The left ventricle has normal function. The left ventricle has no regional wall motion abnormalities. Left ventricular diastolic parameters are indeterminate.  2. Right ventricular systolic function is normal. The right ventricular size is normal.  3. The mitral valve is normal in structure. No evidence of mitral valve regurgitation. No evidence of mitral stenosis.  4. The aortic valve is tricuspid. Aortic valve regurgitation is not visualized. Mild to moderate aortic valve sclerosis/calcification is present, without any evidence of aortic stenosis.  5. The inferior vena cava is normal in size with greater than 50% respiratory variability, suggesting right atrial pressure of 3 mmHg. FINDINGS  Left Ventricle: Left ventricular ejection fraction, by  estimation, is 60 to 65%. The left ventricle has normal function. The left ventricle has no regional wall motion abnormalities. The left ventricular internal cavity size was normal in size. There is  no left ventricular hypertrophy. Left ventricular diastolic parameters are indeterminate. Right Ventricle: The right ventricular size is normal. No increase in right ventricular wall thickness. Right ventricular systolic function is normal. Left Atrium: Left atrial size was normal in size. Right Atrium: Right atrial size was normal in size. Pericardium: There is no evidence of pericardial effusion. Mitral Valve: The mitral valve is normal in structure. Normal mobility of the mitral valve leaflets. Mild to moderate mitral annular calcification. No evidence of mitral valve regurgitation. No evidence of mitral valve stenosis. Tricuspid Valve: The tricuspid valve is normal in structure. Tricuspid valve regurgitation is trivial. No evidence of tricuspid stenosis. Aortic Valve: The aortic valve is tricuspid. Aortic valve regurgitation is not visualized. Mild to moderate aortic valve sclerosis/calcification is present, without any evidence of aortic stenosis. Pulmonic Valve: The pulmonic valve was normal in structure. Pulmonic valve regurgitation is not visualized. No evidence of pulmonic stenosis. Aorta: The aortic root is normal in size and structure. Venous: The inferior vena cava is normal in size with greater than 50% respiratory variability, suggesting right atrial pressure of 3 mmHg. IAS/Shunts: No atrial level shunt detected by color flow Doppler.  LEFT VENTRICLE PLAX 2D LVIDd:         4.60 cm LVIDs:         2.80 cm LV PW:         0.80 cm LV IVS:        0.90 cm LVOT diam:     1.90 cm LV SV:  69 LV SV Index:   37 LVOT Area:     2.84 cm  RIGHT VENTRICLE TAPSE (M-mode): 2.0 cm LEFT ATRIUM             Index       RIGHT ATRIUM          Index LA diam:        3.50 cm 1.88 cm/m  RA Area:     8.44 cm LA Vol (A2C):    40.2 ml 21.65 ml/m RA Volume:   13.40 ml 7.22 ml/m LA Vol (A4C):   22.6 ml 12.17 ml/m LA Biplane Vol: 31.2 ml 16.80 ml/m  AORTIC VALVE LVOT Vmax:   100.00 cm/s LVOT Vmean:  73.300 cm/s LVOT VTI:    0.245 m  AORTA Ao Root diam: 3.30 cm Ao Asc diam:  3.40 cm  SHUNTS Systemic VTI:  0.24 m Systemic Diam: 1.90 cm Fransico Him MD Electronically signed by Fransico Him MD Signature Date/Time: 01/13/2020/4:04:02 PM    Final     Patient Profile     ROSALENA MCCORRY is a 84 y.o. female with a history of oxygen dependent COPD, GERD, obesity, diabetic neuropathy, and anxiety who is being seen today for the evaluation of CHB at the request of Dr. Doristine Bosworth  Assessment & Plan    1.  CHB / Advanced AV block Now s/p St Jude DDD pacer for symptomatic bradycardia due to CHB.  CXR this am appears stable without pneumothorax. Slightly less slack in RV lead compared to fluoro per Dr. Lovena Le.  Resume Toprol.  Would continue observation for now given elevated HR and UTI + management of DM2 Reviewed wound care and arm restrictions and placed in AVS with usual follow up.    2. Acute metabolic encephalopathy ? Low perfusion in setting of CHB.  Appears to have resolved with treatment of UTI and pacing.    3. DM2 HgbA1c 9.8. Per primary. Management is of upmost importance for wound healing.    4. UTI UA as above.  UCx with lactobacillus. On rocephin. Per primary.    5. Sacral ulcer Stage 1 sacral ulcer noted Out of bed.    6. HTN Resume toprol at 67 as above.   EP to see as needed while here. Would consider an additional day of observation given her co-morbidities. Usual follow up and wound care/arm restrictions reviewed with patient and placed in chart.   For questions or updates, please contact [place] Please consult [email or web address] for contact info under Cardiology/STEMI.  Signed, Tucker Friar, PA-C  01/15/2020, 8:21 AM   EP Attending  Patient seen and examined. Agree with above. She  is doing well after PPM insertion for CHB. Her PM interrogation demonstrates normal device function. Her mentation is much improved. I suspect a low output state while in heart block was contributing. Her bp is up and we will restart her toprol, at higher dose.   Salome Spotted.

## 2020-01-16 LAB — URINE CULTURE: Culture: >=100000 — AB

## 2020-01-16 LAB — GLUCOSE, CAPILLARY
Glucose-Capillary: 93 mg/dL (ref 70–99)
Glucose-Capillary: 172 mg/dL — ABNORMAL HIGH (ref 70–99)
Glucose-Capillary: 204 mg/dL — ABNORMAL HIGH (ref 70–99)

## 2020-01-16 MED ORDER — IRBESARTAN 300 MG PO TABS
150.0000 mg | ORAL_TABLET | Freq: Every day | ORAL | Status: DC
  Administered 2020-01-16: 150 mg via ORAL
  Filled 2020-01-16: qty 1

## 2020-01-16 MED ORDER — METOPROLOL SUCCINATE ER 50 MG PO TB24
50.0000 mg | ORAL_TABLET | Freq: Every day | ORAL | 0 refills | Status: DC
Start: 2020-01-17 — End: 2020-02-09

## 2020-01-16 NOTE — TOC Transition Note (Signed)
Transition of Care [facility]) - CM/SW Discharge Note   Patient Details  Name: [name] MRN: [id number] Date of Birth: Aug 09, 1935  Transition [facility] Care [facility]) CM/SW Contact:  Zenon Mayo, RN Phone Number: 01/16/2020, 12:59 PM   Clinical Narrative:    9/11- Patient is for dc today, she has no needs.  9/ 10 NCM spoke with patient , offered choice for [facility] services, she states she does not want [place] services, she has a private duty person, Nelida Gores that comes over 2 or 3 times per week to check in on her and assist her.  She states she has a walker , cane, and oxygen by [employer] at home.  She states Dr. Baird Lyons is her lung doctor who she sees. She states she is not going to do any therapy no matter what anyone says.      Final next level of care: Home/Self Care Barriers to Discharge: No Barriers Identified   Patient Goals and CMS Choice Patient states their goals for this hospitalization and ongoing recovery are:: get better CMS Medicare.gov Compare Post Acute Care list provided to:: Patient Choice offered to / list presented to : Patient  Discharge Placement                       Discharge Plan and Services                  DME Agency: NA       HH Arranged: NA          Social Determinants of Health (SDOH) Interventions     Readmission Risk Interventions No flowsheet data found.

## 2020-01-16 NOTE — Consult Note (Signed)
Reason for Consult:Right shoulder pain Referring Physician: P [name] is an 84 y.o. female.  HPI: [name] began to have right shoulder pain a couple of days ago. She says it started around the time she was admitted. Using the arm makes it worse but nothing really makes it better. She denies prior problems with this shoulder but had pain in the contralateral shoulder that eventually needed a cubital tunnel release by Dr. Amedeo Plenty about 5y ago. This does not feel similar. This morning she also began to have neck pain. She feels like it's a muscular problem. She is here 2/2 complete heart block and received a pacemaker.  Past Medical History:  Diagnosis Date  . Allergic rhinitis, cause unspecified    Sinus CT Rec 12-23-2009  . Arthritis   . Benign paroxysmal positional vertigo 08/09/2012  . 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 Complication of anesthesia    takes along time to wake up  . Diabetes mellitus   . Diarrhea   . Diverticulosis   . Esophageal reflux   . Esophageal stricture   . GERD (gastroesophageal reflux disease)   . Glaucoma   . Hiatal hernia   . Hypertension   . Irritable bowel syndrome   . Neuropathy in diabetes ([place]) 08/09/2012  . Other chronic nonalcoholic liver disease   . Oxygen deficiency   . Sciatica   . Scoliosis   . Shingles 2010  . Shortness of breath   . Steatohepatitis     Past Surgical History:  Procedure Laterality Date  . APPENDECTOMY    . CARPAL TUNNEL RELEASE     right hand  . CHOLECYSTECTOMY OPEN  1978  . COLONOSCOPY  07-2001   mild diverticulosis  . ESOPHAGOGASTRODUODENOSCOPY  9924,26-83   H Hernia,es.stricture s/p dil 61F  . INTRAOCULAR LENS INSERTION Bilateral   . LIVER BIOPSY  [phone]  . PACEMAKER IMPLANT N/A 01/14/2020   Procedure: PACEMAKER IMPLANT;  Surgeon: Evans Lance, MD;  Location: [facility];  Service: Cardiovascular;  Laterality: N/A;  . PARATHYROID  EXPLORATION    . TONSILLECTOMY AND ADENOIDECTOMY    . TOTAL ABDOMINAL HYSTERECTOMY    . ULNAR NERVE TRANSPOSITION  12/07/2011   Procedure: ULNAR NERVE DECOMPRESSION/TRANSPOSITION;this was cancelled-not done  Surgeon: Cammie Sickle., MD;  Location: [facility];  Service: Orthopedics;  Laterality: Right;  right ulnar nerve in situ decompression  . ULNAR TUNNEL RELEASE  03/07/2012   Procedure: CUBITAL TUNNEL RELEASE;  Surgeon: Roseanne Kaufman, MD;  Location: [facility];  Service: Orthopedics;  Laterality: Right;  ulnar nerve release at the elbow      Family History  Problem Relation Age of Onset  . Heart disease Father   . Lung cancer Mother        small cell;Byssinosis  . Lung cancer Sister   . Liver cancer Sister        ? mets from another area of the body  . Diabetes Other        grandmother  . Stroke Maternal Grandfather     Social History:  reports that she quit smoking about 31 years ago. Her smoking use included cigarettes. She has never used smokeless tobacco. She reports that she does not drink alcohol and does not use drugs.  Allergies:  Allergies  Allergen Reactions  . Chlordiazepoxide-Clidinium Other (See Comments)    Sleepy,weak  . Biaxin [Clarithromycin] Other (See Comments)  Foul taste, abd pain, diarrhea  . Tramadol Other (See Comments)    Caused tremors  . Codeine Other (See Comments)    REACTION: gi upset- only in high doses per pt.  Can tolerate cough syrup with codeine.     Medications: I have reviewed the patient's current medications.  Results for orders placed or performed during the hospital encounter of 01/13/20 (from the past 48 hour(s))  Glucose, capillary     Status: Abnormal   Collection Time: 01/14/20 12:06 PM  Result Value Ref Range   Glucose-Capillary 183 (H) 70 - 99 mg/dL    Comment: Glucose reference range applies only to samples taken after fasting for at least 8 hours.   Comment 1 Notify RN    Comment 2  Document in Chart   Glucose, capillary     Status: Abnormal   Collection Time: 01/14/20  1:52 PM  Result Value Ref Range   Glucose-Capillary 173 (H) 70 - 99 mg/dL    Comment: Glucose reference range applies only to samples taken after fasting for at least 8 hours.   Comment 1 Repeat Test   Surgical PCR screen     Status: None   Collection Time: 01/14/20  2:39 PM   Specimen: Nasal Mucosa; Nasal Swab  Result Value Ref Range   MRSA, PCR NEGATIVE NEGATIVE   Staphylococcus aureus NEGATIVE NEGATIVE    Comment: (NOTE) The Xpert SA Assay (FDA approved for NASAL specimens in patients 56 years of age and older), is one component of a comprehensive surveillance program. It is not intended to diagnose infection nor to guide or monitor treatment. Performed at [facility], [place] [address] [place], [place] [address]   Glucose, capillary     Status: Abnormal   Collection Time: 01/14/20  5:09 PM  Result Value Ref Range   Glucose-Capillary 151 (H) 70 - 99 mg/dL    Comment: Glucose reference range applies only to samples taken after fasting for at least 8 hours.  Glucose, capillary     Status: Abnormal   Collection Time: 01/14/20  8:18 PM  Result Value Ref Range   Glucose-Capillary 200 (H) 70 - 99 mg/dL    Comment: Glucose reference range applies only to samples taken after fasting for at least 8 hours.   Comment 1 Notify RN    Comment 2 Document in Chart   Glucose, capillary     Status: Abnormal   Collection Time: 01/15/20 12:39 AM  Result Value Ref Range   Glucose-Capillary 165 (H) 70 - 99 mg/dL    Comment: Glucose reference range applies only to samples taken after fasting for at least 8 hours.   Comment 1 Notify RN    Comment 2 Document in Chart   Glucose, capillary     Status: Abnormal   Collection Time: 01/15/20  4:25 AM  Result Value Ref Range   Glucose-Capillary 156 (H) 70 - 99 mg/dL    Comment: Glucose reference range applies only to samples taken after fasting for at least 8  hours.   Comment 1 Notify RN    Comment 2 Document in Chart   Glucose, capillary     Status: Abnormal   Collection Time: 01/15/20  6:11 AM  Result Value Ref Range   Glucose-Capillary 152 (H) 70 - 99 mg/dL    Comment: Glucose reference range applies only to samples taken after fasting for at least 8 hours.   Comment 1 Notify RN    Comment 2 Document in  Chart   CBC     Status: Abnormal   Collection Time: 01/15/20  7:08 AM  Result Value Ref Range   WBC 10.4 4.0 - 10.5 K/uL   RBC 4.89 3.87 - 5.11 MIL/uL   Hemoglobin 12.9 12.0 - 15.0 g/dL   HCT 43.0 36 - 46 %   MCV 87.9 80.0 - 100.0 fL   MCH 26.4 26.0 - 34.0 pg   MCHC 30.0 30.0 - 36.0 g/dL   RDW 15.9 (H) 11.5 - 15.5 %   Platelets 162 150 - 400 K/uL   nRBC 0.0 0.0 - 0.2 %    Comment: Performed at [facility], [place] [address] [place], [place] [address]  Basic metabolic panel     Status: Abnormal   Collection Time: 01/15/20  7:08 AM  Result Value Ref Range   Sodium 139 135 - 145 mmol/L   Potassium 4.3 3.5 - 5.1 mmol/L   Chloride 100 98 - 111 mmol/L   CO2 30 22 - 32 mmol/L   Glucose, Bld 172 (H) 70 - 99 mg/dL    Comment: Glucose reference range applies only to samples taken after fasting for at least 8 hours.   BUN 21 8 - 23 mg/dL   Creatinine, Ser 0.72 0.44 - 1.00 mg/dL   Calcium 8.7 (L) 8.9 - 10.3 mg/dL   GFR calc non Af Amer >60 >60 mL/min   GFR calc Af Amer >60 >60 mL/min   Anion gap 9 5 - 15    Comment: Performed at [facility] [address], [place], [place] [address]  Magnesium     Status: None   Collection Time: 01/15/20  7:08 AM  Result Value Ref Range   Magnesium 2.3 1.7 - 2.4 mg/dL    Comment: Performed at [facility] [address], [place], [place] [address]  Phosphorus     Status: None   Collection Time: 01/15/20  7:08 AM  Result Value Ref Range   Phosphorus 3.5 2.5 - 4.6 mg/dL    Comment: Performed at [facility] [address], [place], [place] [address]  Glucose, capillary      Status: Abnormal   Collection Time: 01/15/20  7:15 AM  Result Value Ref Range   Glucose-Capillary 167 (H) 70 - 99 mg/dL    Comment: Glucose reference range applies only to samples taken after fasting for at least 8 hours.  Glucose, capillary     Status: Abnormal   Collection Time: 01/15/20 11:29 AM  Result Value Ref Range   Glucose-Capillary 176 (H) 70 - 99 mg/dL    Comment: Glucose reference range applies only to samples taken after fasting for at least 8 hours.   Comment 1 Notify RN   Glucose, capillary     Status: Abnormal   Collection Time: 01/15/20  3:18 PM  Result Value Ref Range   Glucose-Capillary 185 (H) 70 - 99 mg/dL    Comment: Glucose reference range applies only to samples taken after fasting for at least 8 hours.   Comment 1 Notify RN   Glucose, capillary     Status: Abnormal   Collection Time: 01/15/20  9:37 PM  Result Value Ref Range   Glucose-Capillary 210 (H) 70 - 99 mg/dL    Comment: Glucose reference range applies only to samples taken after fasting for at least 8 hours.   Comment 1 Notify RN    Comment 2 Document in Chart   Glucose, capillary  Status: None   Collection Time: 01/16/20  6:16 AM  Result Value Ref Range   Glucose-Capillary 93 70 - 99 mg/dL    Comment: Glucose reference range applies only to samples taken after fasting for at least 8 hours.   Comment 1 Notify RN    Comment 2 Document in Chart   Glucose, capillary     Status: Abnormal   Collection Time: 01/16/20 11:23 AM  Result Value Ref Range   Glucose-Capillary 172 (H) 70 - 99 mg/dL    Comment: Glucose reference range applies only to samples taken after fasting for at least 8 hours.   *Note: Due to a large number of results and/or encounters for the requested time period, some results have not been displayed. A complete set of results can be found in Results Review.    DG Chest 2 View  Result Date: 01/15/2020 CLINICAL DATA:  Pacemaker. EXAM: CHEST - 2 VIEW COMPARISON:  01/13/2020.  FINDINGS: Cardiac pacer with lead tips over the right atrium and right ventricle. Borderline cardiomegaly and pulmonary venous congestion. No evidence of pulmonary edema. Bibasilar atelectasis. Tiny right pleural effusion cannot be excluded. Surgical clips noted over the lower neck. IMPRESSION: 1. Cardiac pacer with lead tips over the right atrium right ventricle. Borderline cardiomegaly and pulmonary venous congestion. No evidence of pulmonary edema. 2. Bibasilar atelectasis. Tiny right pleural effusion cannot be excluded. Electronically Signed   By: Marcello Moores  Register   On: 01/15/2020 06:00   EP PPM/ICD IMPLANT  Result Date: 01/14/2020 CONCLUSIONS:  1. Successful implantation of a [employer] dual-chamber pacemaker for symptomatic bradycardia due to complete heart block  2. No early apparent complications.       Cristopher Peru, MD 01/14/2020 5:58 PM    Review of Systems  HENT: Negative for ear discharge, ear pain, hearing loss and tinnitus.   Eyes: Negative for photophobia and pain.  Respiratory: Negative for cough and shortness of breath.   Cardiovascular: Negative for chest pain.  Gastrointestinal: Negative for abdominal pain, nausea and vomiting.  Genitourinary: Negative for dysuria, flank pain, frequency and urgency.  Musculoskeletal: Positive for arthralgias (Right shoulder) and neck pain. Negative for back pain and myalgias.  Neurological: Negative for dizziness and headaches.  Hematological: Does not bruise/bleed easily.  Psychiatric/Behavioral: The patient is not nervous/anxious.    Blood pressure (!) 160/69, pulse 99, temperature 98.8 F (37.1 C), temperature source Oral, resp. rate 16, height 5' 4"  (1.626 m), weight 80.7 kg, SpO2 93 %. Physical Exam Constitutional:      General: She is not in acute distress.    Appearance: She is well-developed. She is not diaphoretic.  HENT:     Head: Normocephalic and atraumatic.  Eyes:     General: No scleral icterus.       Right eye: No discharge.         Left eye: No discharge.     Conjunctiva/sclera: Conjunctivae normal.  Cardiovascular:     Rate and Rhythm: Normal rate and regular rhythm.  Pulmonary:     Effort: Pulmonary effort is normal. No respiratory distress.  Musculoskeletal:     Cervical back: Normal range of motion.     Comments: Right shoulder, elbow, wrist, digits- no skin wounds, mild diffuse TTP, mod post sup scap, no instability, no blocks to motion  Sens  Ax/R/M/U intact  Mot   Ax/ R/ PIN/ M/ AIN/ U intact  Rad 2+  Skin:    General: Skin is warm and dry.  Neurological:  Mental Status: She is alert.  Psychiatric:        Behavior: Behavior normal.     Assessment/Plan: Right shoulder/neck pain -- I think she's having some muscle pain/spasm. She has good ROM in her shoulder so I doubt this is any arthropathy. Would suggest heat, NSAID's if not contraindicated (I like Naproxen 562m bid) and a low-sedating muscle relaxer like Robaxin. She can f/u with Dr. DSharol Givenwho she sees for arthritis if this doesn't resolve in a timely fashion. I see no impediment to discharge from my standpoint.    MLisette Abu PA-C Orthopedic Surgery [id number]/02/2020, 11:38 AM

## 2020-01-16 NOTE — Discharge Summary (Signed)
Physician Discharge Summary  [name] JKK:[id number] DOB: [birth year] DOA: 01/13/2020  PCP: No primary care provider on file.  Admit date: 01/13/2020   Discharge date: 01/16/2020  Admitted From:  Home. Disposition:  Home with home services.  Recommendations for Outpatient Follow-up:  1. Follow up with PCP in 1-2 weeks. 2. Please obtain BMP/CBC in one week. 3. Advised to follow-up with cardiologist as scheduled. 4. Patient was admitted with complete heart block, she underwent PPM,  tolerated well. 5.  All home medication resumed.  Appointments were made. 6. She has completed treatment for UTI. 7. Advised to take naproxen as needed for shoulder pain.  Home Health: Yes  Equipment/Devices: Oxygen  Discharge Condition: Stable. CODE STATUS:Full code Diet recommendation: Heart Healthy   Brief Summary / Hospital course: [name] an 84 y.o.femalewith medical history significant ofhypertension, hyperlipidemia, diabetes mellitus, chronic hypoxemic respiratory failure secondary to underlying COPD-on 2.5 to 3 L of oxygen at home, obesity, GERD, diabetic neuropathy,  anxiety presents to emergency department with confusion. Patient was found to be bradycardic and EKG shows third-degree AV block after EMS arrived. Upon arrival to ED: Patient  remains bradycardic in 40s,EKG shows complete heart block,  CT head came back negative for acute findings. Cardiology consulted.  Patient continued to remain in heart block with symptoms. Patient s/p permanent pacemaker placement 01/14/2020 , tolerated well. Patient is also found to have UTI, completed ceftriaxone for three days.  Her altered mental status has improved.  She is cleared from electrophysiologist to be discharged home.  Her blood pressure medications been resumed.  She complains of right shoulder pain, orthopedics was consulted recommended naproxen as needed for arthritic pain and follow-up with [name] as an outpatient.  PT  recommended  skilled nursing facility but patient refused to go to nursing home.  She also refused home health services.  She has personal home health nurse coming 3 times a week.  Patient is being discharged home with home services.  Advised to follow-up with [name] as scheduled.  She was managed for below problems.  Discharge Diagnoses:  Principal Problem:   Acute metabolic encephalopathy Active Problems:   COPD mixed type (HCC)   G E R D   Hypertension   Diabetic polyneuropathy associated with type 2 diabetes mellitus (HCC)   Complete heart block (HCC)   Acute on chronic respiratory failure with hypoxia (HCC)   Diabetes (HCC)   Elevated troponin   Pressure injury of skin  Acute metabolic encephalopathy: >>> Resolved -Likely secondary to heart block& Hypoxia. Patient bradycardic upon arrival.   EKG:  Complete Heart block,  Chest x-ray and CT head negative for acute findings.   Patient denies ACS symptoms. COVID-19: Negative, lactic acid: WNL.  UA is positive for leukocyte, WBC, RBC however patient denies any urinary symptoms. -Admitted patient stepdown unit for close monitoring on telemetry. -EP consulted, s/p PPM 01/14/20, tolerated well.  - Magnesium : normal. , TSH:  normal   -Monitor heart rate closely. -On fall precautions -Consult PT/OT/SLP -Cleared from cardiology/  electrophysiologist to be discharged.  Complete heart block: >>>  Resolved  - Reviewed home medications. Takes metoprolol at home. Hold for now. Reviewed EKG. - Cardio : s/p PPM 01/14/20, tolerated well.  -Transthoracic echo : LVEF 60-65%. - medication resumed.   Acute on chronic hypoxemic respiratory failure secondary to COPD:   No wheezing noted on exam. Chest x-ray negative for infection, COVID-19 negative On continuous pulse ox. We will try to wean off  of oxygen as tolerated. -Continue home inhalers.  Elevated troponin: Patient denies ACS symptoms. -Initial troponin 312- 356 . No  ischemia  -Reviewed EKG.  Asymptomatic bacteriuria: -Patient denies any urinary symptoms including dysuria, foul-smelling urine, change in urinary frequency, fever, chills, nausea, vomiting or lower back pain. Reviewed UA result. Patient is afebrile with no leukocytosis. - will treat with ceftriaxone considering UTI. - Urine culture grew gram - rods, sensitivity pending. -Discussed with pharmacy it was contaminant.  She completed antibiotic for 3 days.  Type 2 diabetes mellitus: -Started patient on sliding scale insulin.  Check A1c. Monitor blood sugar closely.  GERD: Continue PPI  Anxiety: Continue Xanax  Discharge Instructions  Discharge Instructions    Call MD for:  difficulty breathing, headache or visual disturbances   Complete by: As directed    Call MD for:  persistant dizziness or light-headedness   Complete by: As directed    Call MD for:  persistant nausea and vomiting   Complete by: As directed    Call MD for:  temperature >100.4   Complete by: As directed    Diet - low sodium heart healthy   Complete by: As directed    Diet Carb Modified   Complete by: As directed    Discharge instructions   Complete by: As directed    Advised to follow-up with primary care physician in 1 week. Advised to follow-up with cardiologist as scheduled. Patient admitted with complete heart block underwent permanent pacemaker placement tolerated well. All home medication resumed.  Advised to follow-up with the ED physician appointment is made. She has completed treatment for UTI.   Discharge wound care:   Complete by: As directed    Follow-up cardiology for dressing change.   Increase activity slowly   Complete by: As directed      Allergies as of 01/16/2020      Reactions   Chlordiazepoxide-clidinium Other (See Comments)   Sleepy,weak   Biaxin [clarithromycin] Other (See Comments)   Foul taste, abd pain, diarrhea   Tramadol Other (See Comments)   Caused tremors    Codeine Other (See Comments)   REACTION: gi upset- only in high doses per pt.  Can tolerate cough syrup with codeine.       Medication List    STOP taking these medications   albuterol 108 (90 Base) MCG/ACT inhaler Commonly known as: VENTOLIN HFA   azelastine 0.1 % nasal spray Commonly known as: ASTELIN   celecoxib 100 MG capsule Commonly known as: CeleBREX   cholestyramine 4 g packet Commonly known as: Questran   dicyclomine 10 MG capsule Commonly known as: BENTYL   meclizine 25 MG tablet Commonly known as: ANTIVERT   metoCLOPramide 5 MG tablet Commonly known as: REGLAN   mupirocin ointment 2 % Commonly known as: BACTROBAN   Vitamin D (Ergocalciferol) 1.25 MG (50000 UNIT) Caps capsule Commonly known as: DRISDOL     TAKE these medications   Accu-Chek Aviva Plus test strip Generic drug: glucose blood Use as instructed to check blood sugar 4 times a day. DX:E11.65   Accu-Chek Softclix Lancet Dev Kit 1 each by Does not apply route daily. Use as instructed to check blood sugar 8 times daily.   Accu-Chek Softclix Lancets lancets 1 each by Other route daily. Use as instructed to check blood sugar 8 times daily.   AeroChamber MV inhaler Use as instructed   ALPRAZolam 0.5 MG tablet Commonly known as: XANAX TAKE 1/2 TO 1 TABLET TWICE DAILY AS NEEDED FOR  ANXIETY. What changed: See the new instructions.   aspirin 325 MG tablet Take 325 mg by mouth daily.   Breztri Aerosphere 160-9-4.8 MCG/ACT Aero Generic drug: Budeson-Glycopyrrol-Formoterol Inhale 2 puffs into the lungs 2 (two) times daily.   Insulin Pen Needle 31G X 8 MM Misc Commonly known as: B-D ULTRAFINE III SHORT PEN TEST 3 TIMES A DAY   B-D UF III MINI PEN NEEDLES 31G X 5 MM Misc Generic drug: Insulin Pen Needle USE 5 PER DAY TO INJECT INSULIN   Insulin Syringe-Needle U-100 31G X 15/64" 0.5 ML Misc Commonly known as: BD Insulin Syringe Ultrafine Use 2 per day dx code E11.65   ipratropium-albuterol  0.5-2.5 (3) MG/3ML Soln Commonly known as: DUONEB 1 vial in neb every 8 hours and as needed What changed:   how much to take  how to take this  when to take this  reasons to take this  additional instructions   irbesartan 150 MG tablet Commonly known as: Avapro Take 1 tablet (150 mg total) by mouth daily.   levalbuterol 45 MCG/ACT inhaler Commonly known as: XOPENEX HFA Inhale 1-2 puffs into the lungs every 6 (six) hours as needed for wheezing.   magic mouthwash Soln Swish with 2 teaspoonfuls by mouth as needed   metoprolol succinate 50 MG 24 hr tablet Commonly known as: TOPROL-XL Take 1 tablet (50 mg total) by mouth daily. Take with or immediately following a meal. Start taking on: January 17, 2020 What changed:   medication strength  how much to take  additional instructions   NovoLOG FlexPen 100 UNIT/ML FlexPen Generic drug: insulin aspart Inject 12-14 units under the skin at breakfast 14-16 at lunch, and 12-16 at dinner.   omeprazole 20 MG capsule Commonly known as: PRILOSEC TAKE (1) CAPSULE DAILY. What changed: See the new instructions.   OXYGEN Inhale 3 L into the lungs continuous.   pravastatin 20 MG tablet Commonly known as: PRAVACHOL Take 1 tablet (20 mg total) by mouth daily.   sodium chloride HYPERTONIC 3 % nebulizer solution 1 ampule neb every 6 hours if needed to clear mucus   Stiolto Respimat 2.5-2.5 MCG/ACT Aers Generic drug: Tiotropium Bromide-Olodaterol USE 2 PUFFS DAILY What changed: See the new instructions.   Tyler Aas FlexTouch 200 UNIT/ML FlexTouch Pen Generic drug: insulin degludec Inject 58 Units into the skin daily. What changed: how much to take            Discharge Care Instructions  (From admission, onward)         Start     Ordered   01/16/20 0000  Discharge wound care:       Comments: Follow-up cardiology for dressing change.   01/16/20 1233          Follow-up Information    Evans Lance, MD Follow  up on 04/20/2020.   Specialty: Cardiology Why: at 3 pm for 3 month post pacemaker check Contact information: [address] [place] [address] [phone]        [facility] Follow up on 01/27/2020.   Why: at 4 pm for pacemaker wound check Contact information: [place] [address] [phone]             Allergies  Allergen Reactions  . Chlordiazepoxide-Clidinium Other (See Comments)    Sleepy,weak  . Biaxin [Clarithromycin] Other (See Comments)    Foul taste, abd pain, diarrhea  . Tramadol Other (See Comments)  Caused tremors  . Codeine Other (See Comments)    REACTION: gi upset- only in high doses per pt.  Can tolerate cough syrup with codeine.     Consultations:  Cardiology    Procedures/Studies: DG Chest 2 View  Result Date: 01/15/2020 CLINICAL DATA:  Pacemaker. EXAM: CHEST - 2 VIEW COMPARISON:  01/13/2020. FINDINGS: Cardiac pacer with lead tips over the right atrium and right ventricle. Borderline cardiomegaly and pulmonary venous congestion. No evidence of pulmonary edema. Bibasilar atelectasis. Tiny right pleural effusion cannot be excluded. Surgical clips noted over the lower neck. IMPRESSION: 1. Cardiac pacer with lead tips over the right atrium right ventricle. Borderline cardiomegaly and pulmonary venous congestion. No evidence of pulmonary edema. 2. Bibasilar atelectasis. Tiny right pleural effusion cannot be excluded. Electronically Signed   By: Marcello Moores  Register   On: 01/15/2020 06:00   CT Head Wo Contrast  Result Date: 01/13/2020 CLINICAL DATA:  Altered mental status EXAM: CT HEAD WITHOUT CONTRAST TECHNIQUE: Contiguous axial images were obtained from the base of the skull through the vertex without intravenous contrast. COMPARISON:  May 11, 2013 FINDINGS: Brain: Mild diffuse atrophy is stable. There is no intracranial mass, hemorrhage,  extra-axial fluid collection, or midline shift. There is small vessel disease in the centra semiovale bilaterally, stable. There is a prior small lacunar infarct in the head of the caudate nucleus on the right. No acute appearing infarct is demonstrable. Vascular: No appreciable hyperdense vessel. There is calcification in each carotid siphon region. Skull: The bony calvarium appears intact. Sinuses/Orbits: There is mucosal thickening in several ethmoid air cells. Orbits appear symmetric bilaterally. Evidence of previous cataract removals. Other: Visualized mastoid air cells are clear. IMPRESSION: Stable atrophy with periventricular small vessel disease. Prior small infarct in the head of the caudate nucleus on the right. No acute infarct. No mass or hemorrhage. Foci of arterial vascular calcification noted. There is mucosal thickening in several ethmoid air cells. Electronically Signed   By: Lowella Grip III M.D.   On: 01/13/2020 13:54   EP PPM/ICD IMPLANT  Result Date: 01/14/2020 CONCLUSIONS:  1. Successful implantation of a [employer]. Jude dual-chamber pacemaker for symptomatic bradycardia due to complete heart block  2. No early apparent complications.       Cristopher Peru, MD 01/14/2020 5:58 PM   DG Chest Portable 1 View  Result Date: 01/13/2020 CLINICAL DATA:  Hypoxia EXAM: PORTABLE CHEST 1 VIEW COMPARISON:  08/06/2019 FINDINGS: Cardiac shadow is stable. Aortic calcifications are again seen. The lungs are well aerated bilaterally. Mild bibasilar atelectasis is noted. No sizable effusion is seen. No bony abnormality is noted. IMPRESSION: Mild bibasilar atelectasis.  No other focal abnormality is seen. Electronically Signed   By: Inez Catalina M.D.   On: 01/13/2020 12:21   ECHOCARDIOGRAM COMPLETE  Result Date: 01/13/2020    ECHOCARDIOGRAM REPORT   Patient Name:   [name] Date of Exam: 01/13/2020 Medical Rec #:  [id number]        Height:       63.0 in Accession #:    [id number]       Weight:       181.9  lb Date of Birth:  February 12, 1936        BSA:          1.857 m Patient Age:    43 years         BP:           183/76 mmHg Patient Gender: F  HR:           46 bpm. Exam Location:  Inpatient Procedure: 2D Echo, Cardiac Doppler and Color Doppler Indications:    Heartblock I45.9  History:        Patient has prior history of Echocardiogram examinations, most                 recent 05/12/2013. Risk Factors:Diabetes, Hypertension and GERD.  Sonographer:    Bernadene Person RDCS Referring Phys: [id number] [place]  Sonographer Comments: Technically difficult study due to poor echo windows. Image acquisition challenging due to uncooperative patient. Patient was unable to turn on her side or stay still. During the exam the patient expressed that she wanted the test to be over and did not want it to continue even after explaining the importance of the exam. IMPRESSIONS  1. Left ventricular ejection fraction, by estimation, is 60 to 65%. The left ventricle has normal function. The left ventricle has no regional wall motion abnormalities. Left ventricular diastolic parameters are indeterminate.  2. Right ventricular systolic function is normal. The right ventricular size is normal.  3. The mitral valve is normal in structure. No evidence of mitral valve regurgitation. No evidence of mitral stenosis.  4. The aortic valve is tricuspid. Aortic valve regurgitation is not visualized. Mild to moderate aortic valve sclerosis/calcification is present, without any evidence of aortic stenosis.  5. The inferior vena cava is normal in size with greater than 50% respiratory variability, suggesting right atrial pressure of 3 mmHg. FINDINGS  Left Ventricle: Left ventricular ejection fraction, by estimation, is 60 to 65%. The left ventricle has normal function. The left ventricle has no regional wall motion abnormalities. The left ventricular internal cavity size was normal in size. There is  no left ventricular hypertrophy.  Left ventricular diastolic parameters are indeterminate. Right Ventricle: The right ventricular size is normal. No increase in right ventricular wall thickness. Right ventricular systolic function is normal. Left Atrium: Left atrial size was normal in size. Right Atrium: Right atrial size was normal in size. Pericardium: There is no evidence of pericardial effusion. Mitral Valve: The mitral valve is normal in structure. Normal mobility of the mitral valve leaflets. Mild to moderate mitral annular calcification. No evidence of mitral valve regurgitation. No evidence of mitral valve stenosis. Tricuspid Valve: The tricuspid valve is normal in structure. Tricuspid valve regurgitation is trivial. No evidence of tricuspid stenosis. Aortic Valve: The aortic valve is tricuspid. Aortic valve regurgitation is not visualized. Mild to moderate aortic valve sclerosis/calcification is present, without any evidence of aortic stenosis. Pulmonic Valve: The pulmonic valve was normal in structure. Pulmonic valve regurgitation is not visualized. No evidence of pulmonic stenosis. Aorta: The aortic root is normal in size and structure. Venous: The inferior vena cava is normal in size with greater than 50% respiratory variability, suggesting right atrial pressure of 3 mmHg. IAS/Shunts: No atrial level shunt detected by color flow Doppler.  LEFT VENTRICLE PLAX 2D LVIDd:         4.60 cm LVIDs:         2.80 cm LV PW:         0.80 cm LV IVS:        0.90 cm LVOT diam:     1.90 cm LV SV:         69 LV SV Index:   37 LVOT Area:     2.84 cm  RIGHT VENTRICLE TAPSE (M-mode): 2.0 cm LEFT ATRIUM  Index       RIGHT ATRIUM          Index LA diam:        3.50 cm 1.88 cm/m  RA Area:     8.44 cm LA Vol (A2C):   40.2 ml 21.65 ml/m RA Volume:   13.40 ml 7.22 ml/m LA Vol (A4C):   22.6 ml 12.17 ml/m LA Biplane Vol: 31.2 ml 16.80 ml/m  AORTIC VALVE LVOT Vmax:   100.00 cm/s LVOT Vmean:  73.300 cm/s LVOT VTI:    0.245 m  AORTA Ao Root diam:  3.30 cm Ao Asc diam:  3.40 cm  SHUNTS Systemic VTI:  0.24 m Systemic Diam: 1.90 cm Fransico Him MD Electronically signed by Fransico Him MD Signature Date/Time: 01/13/2020/4:04:02 PM    Final     Echocardiogram, permanent pacemaker placement.   Subjective: Patient was seen and examined at bedside.  She reports feeling much better.  Her blood pressure slightly elevated but it improved now. She complains of right shoulder pain.  Orthopedic consulted. Patient wants to be discharged home.  She said she has a home health nurse coming every other day,  does not want any home services to be arranged.  Discharge Exam: Vitals:   01/16/20 0910 01/16/20 1200  BP: (!) 160/69 (!) 147/100  Pulse: 99 86  Resp:    Temp:    SpO2: 93% 96%   Vitals:   01/16/20 0600 01/16/20 0804 01/16/20 0910 01/16/20 1200  BP: (!) 166/63  (!) 160/69 (!) 147/100  Pulse: 75  99 86  Resp:      Temp:  98.8 F (37.1 C)    TempSrc:  Oral    SpO2: 96%  93% 96%  Weight:      Height:        General: Pt is alert, awake, not in acute distress Cardiovascular: RRR, S1/S2 +, no rubs, no gallops, PPM noted. Respiratory: CTA bilaterally, no wheezing, no rhonchi Abdominal: Soft, NT, ND, bowel sounds + Extremities: Right shoulder tenderness, ROM; Normal. no edema, no cyanosis    The results of significant diagnostics from this hospitalization (including imaging, microbiology, ancillary and laboratory) are listed below for reference.     Microbiology: Recent Results (from the past 240 hour(s))  Urine culture     Status: Abnormal   Collection Time: 01/13/20 12:42 PM   Specimen: Urine, Catheterized  Result Value Ref Range Status   Specimen Description URINE, CATHETERIZED  Final   Special Requests NONE  Final   Culture (A)  Final    >=100,000 COLONIES/mL KLEBSIELLA PNEUMONIAE >=100,000 COLONIES/mL LACTOBACILLUS SPECIES Standardized susceptibility testing for this organism is not available. Performed at [facility], [place] [address], [place], [place] [address]    Report Status 01/16/2020 FINAL  Final   Organism ID, Bacteria KLEBSIELLA PNEUMONIAE (A)  Final      Susceptibility   Klebsiella pneumoniae - MIC*    AMPICILLIN >=32 RESISTANT Resistant     CEFAZOLIN <=4 SENSITIVE Sensitive     CEFTRIAXONE <=0.25 SENSITIVE Sensitive     CIPROFLOXACIN <=0.25 SENSITIVE Sensitive     GENTAMICIN <=1 SENSITIVE Sensitive     IMIPENEM <=0.25 SENSITIVE Sensitive     NITROFURANTOIN 128 RESISTANT Resistant     TRIMETH/SULFA <=20 SENSITIVE Sensitive     AMPICILLIN/SULBACTAM 8 SENSITIVE Sensitive     * >=100,000 COLONIES/mL KLEBSIELLA PNEUMONIAE  SARS Coronavirus 2 by RT PCR (hospital order, performed in [facility] hospital lab) Nasopharyngeal Nasopharyngeal Swab  Status: None   Collection Time: 01/13/20  1:22 PM   Specimen: Nasopharyngeal Swab  Result Value Ref Range Status   SARS Coronavirus 2 NEGATIVE NEGATIVE Final    Comment: (NOTE) SARS-CoV-2 target nucleic acids are NOT DETECTED.  The SARS-CoV-2 RNA is generally detectable in upper and lower respiratory specimens during the acute phase of infection. The lowest concentration of SARS-CoV-2 viral copies this assay can detect is 250 copies / mL. A negative result does not preclude SARS-CoV-2 infection and should not be used as the sole basis for treatment or other patient management decisions.  A negative result may occur with improper specimen collection / handling, submission of specimen other than nasopharyngeal swab, presence of viral mutation(s) within the areas targeted by this assay, and inadequate number of viral copies (<250 copies / mL). A negative result must be combined with clinical observations, patient history, and epidemiological information.  Fact Sheet for Patients:   StrictlyIdeas.no  Fact Sheet for Healthcare Providers: BankingDealers.co.za  This test is not yet approved or   cleared by the [place] FDA and has been authorized for detection and/or diagnosis of SARS-CoV-2 by FDA under an Emergency Use Authorization (EUA).  This EUA will remain in effect (meaning this test can be used) for the duration of the COVID-19 declaration under Section 564(b)(1) of the Act, 21 U.S.C. section 360bbb-3(b)(1), unless the authorization is terminated or revoked sooner.  Performed at [facility], [place] [address] [place], [place] [address]   Culture, blood (routine x 2)     Status: None (Preliminary result)   Collection Time: 01/13/20  4:03 PM   Specimen: BLOOD  Result Value Ref Range Status   Specimen Description BLOOD SITE NOT SPECIFIED  Final   Special Requests   Final    BOTTLES DRAWN AEROBIC AND ANAEROBIC Blood Culture results may not be optimal due to an inadequate volume of blood received in culture bottles   Culture   Final    NO GROWTH 3 DAYS Performed at [facility], [place] [address] [place], [place] [address]    Report Status PENDING  Incomplete  Culture, blood (routine x 2)     Status: None (Preliminary result)   Collection Time: 01/13/20  4:04 PM   Specimen: BLOOD  Result Value Ref Range Status   Specimen Description BLOOD SITE NOT SPECIFIED  Final   Special Requests   Final    BOTTLES DRAWN AEROBIC ONLY Blood Culture results may not be optimal due to an inadequate volume of blood received in culture bottles   Culture   Final    NO GROWTH 3 DAYS Performed at [facility], [place] [address] [place], [place] [address]    Report Status PENDING  Incomplete  Surgical PCR screen     Status: None   Collection Time: 01/14/20  2:39 PM   Specimen: Nasal Mucosa; Nasal Swab  Result Value Ref Range Status   MRSA, PCR NEGATIVE NEGATIVE Final   Staphylococcus aureus NEGATIVE NEGATIVE Final    Comment: (NOTE) The Xpert SA Assay (FDA approved for NASAL specimens in patients 91 years of age and older), is one component of a  comprehensive surveillance program. It is not intended to diagnose infection nor to guide or monitor treatment. Performed at [facility], [place] [address], [place],  [address]      Labs: BNP (last 3 results) No results for input(s): BNP in the last 8760 hours. Basic Metabolic Panel: Recent Labs  Lab 01/13/20 1213 01/13/20 1226 01/13/20 1603 01/14/20 0338 01/15/20 0708  NA 134* 134*  --  136 139  K 4.8 4.7  --  4.5 4.3  CL 96*  --   --  98 100  CO2 28  --   --  29 30  GLUCOSE 323*  --   --  225* 172*  BUN 15  --   --  24* 21  CREATININE 0.78  --  0.82 0.91 0.72  CALCIUM 9.0  --   --  9.0 8.7*  MG  --   --  1.7  --  2.3  PHOS  --   --  3.4  --  3.5   Liver Function Tests: Recent Labs  Lab 01/13/20 1213 01/14/20 0338  AST 20 23  ALT 15 16  ALKPHOS 50 50  BILITOT 0.8 0.8  PROT 7.1 6.5  ALBUMIN 3.4* 3.1*   No results for input(s): LIPASE, AMYLASE in the last 168 hours. No results for input(s): AMMONIA in the last 168 hours. CBC: Recent Labs  Lab 01/13/20 1213 01/13/20 1226 01/13/20 1603 01/14/20 0338 01/15/20 0708  WBC 10.0  --  10.2 12.2* 10.4  NEUTROABS 8.0*  --   --   --   --   HGB 12.5 13.9 13.6 13.5 12.9  HCT 41.0 41.0 44.1 42.4 43.0  MCV 86.3  --  85.0 85.8 87.9  PLT 167  --  PLATELET CLUMPS NOTED ON SMEAR, UNABLE TO ESTIMATE 176 162   Cardiac Enzymes: No results for input(s): CKTOTAL, CKMB, CKMBINDEX, TROPONINI in the last 168 hours. BNP: Invalid input(s): POCBNP CBG: Recent Labs  Lab 01/15/20 1129 01/15/20 1518 01/15/20 2137 01/16/20 0616 01/16/20 1123  GLUCAP 176* 185* 210* 93 172*   D-Dimer No results for input(s): DDIMER in the last 72 hours. Hgb A1c Recent Labs    01/14/20 0338  HGBA1C 9.8*   Lipid Profile No results for input(s): CHOL, HDL, LDLCALC, TRIG, CHOLHDL, LDLDIRECT in the last 72 hours. Thyroid function studies Recent Labs    01/13/20 1603  TSH 1.970   Anemia work up No results for input(s):  VITAMINB12, FOLATE, FERRITIN, TIBC, IRON, RETICCTPCT in the last 72 hours. Urinalysis    Component Value Date/Time   COLORURINE YELLOW 01/13/2020 1255   APPEARANCEUR TURBID (A) 01/13/2020 1255   LABSPEC 1.023 01/13/2020 1255   PHURINE 5.0 01/13/2020 1255   GLUCOSEU >=500 (A) 01/13/2020 1255   GLUCOSEU 100 (A) 05/11/2017 1015   HGBUR MODERATE (A) 01/13/2020 1255   BILIRUBINUR NEGATIVE 01/13/2020 1255   BILIRUBINUR negative 01/25/2017 1616   KETONESUR 20 (A) 01/13/2020 1255   PROTEINUR 100 (A) 01/13/2020 1255   UROBILINOGEN 0.2 05/11/2017 1015   NITRITE NEGATIVE 01/13/2020 1255   LEUKOCYTESUR SMALL (A) 01/13/2020 1255   Sepsis Labs Invalid input(s): PROCALCITONIN,  WBC,  LACTICIDVEN Microbiology Recent Results (from the past 240 hour(s))  Urine culture     Status: Abnormal   Collection Time: 01/13/20 12:42 PM   Specimen: Urine, Catheterized  Result Value Ref Range Status   Specimen Description URINE, CATHETERIZED  Final   Special Requests NONE  Final   Culture (A)  Final    >=100,000 COLONIES/mL KLEBSIELLA PNEUMONIAE >=100,000 COLONIES/mL LACTOBACILLUS SPECIES Standardized susceptibility testing for this organism is not available. Performed at [facility], [place] [address], [place], [place] [address]    Report Status 01/16/2020 FINAL  Final   Organism ID, Bacteria KLEBSIELLA PNEUMONIAE (A)  Final  Susceptibility   Klebsiella pneumoniae - MIC*    AMPICILLIN >=32 RESISTANT Resistant     CEFAZOLIN <=4 SENSITIVE Sensitive     CEFTRIAXONE <=0.25 SENSITIVE Sensitive     CIPROFLOXACIN <=0.25 SENSITIVE Sensitive     GENTAMICIN <=1 SENSITIVE Sensitive     IMIPENEM <=0.25 SENSITIVE Sensitive     NITROFURANTOIN 128 RESISTANT Resistant     TRIMETH/SULFA <=20 SENSITIVE Sensitive     AMPICILLIN/SULBACTAM 8 SENSITIVE Sensitive     * >=100,000 COLONIES/mL KLEBSIELLA PNEUMONIAE  SARS Coronavirus 2 by RT PCR (hospital order, performed in [facility] lab) Nasopharyngeal  Nasopharyngeal Swab     Status: None   Collection Time: 01/13/20  1:22 PM   Specimen: Nasopharyngeal Swab  Result Value Ref Range Status   SARS Coronavirus 2 NEGATIVE NEGATIVE Final    Comment: (NOTE) SARS-CoV-2 target nucleic acids are NOT DETECTED.  The SARS-CoV-2 RNA is generally detectable in upper and lower respiratory specimens during the acute phase of infection. The lowest concentration of SARS-CoV-2 viral copies this assay can detect is 250 copies / mL. A negative result does not preclude SARS-CoV-2 infection and should not be used as the sole basis for treatment or other patient management decisions.  A negative result may occur with improper specimen collection / handling, submission of specimen other than nasopharyngeal swab, presence of viral mutation(s) within the areas targeted by this assay, and inadequate number of viral copies (<250 copies / mL). A negative result must be combined with clinical observations, patient history, and epidemiological information.  Fact Sheet for Patients:   StrictlyIdeas.no  Fact Sheet for Healthcare Providers: BankingDealers.co.za  This test is not yet approved or  cleared by the [place] FDA and has been authorized for detection and/or diagnosis of SARS-CoV-2 by FDA under an Emergency Use Authorization (EUA).  This EUA will remain in effect (meaning this test can be used) for the duration of the COVID-19 declaration under Section 564(b)(1) of the Act, 21 U.S.C. section 360bbb-3(b)(1), unless the authorization is terminated or revoked sooner.  Performed at [facility], [place] [address], [place], [place] [address]   Culture, blood (routine x 2)     Status: None (Preliminary result)   Collection Time: 01/13/20  4:03 PM   Specimen: BLOOD  Result Value Ref Range Status   Specimen Description BLOOD SITE NOT SPECIFIED  Final   Special Requests   Final    BOTTLES DRAWN AEROBIC  AND ANAEROBIC Blood Culture results may not be optimal due to an inadequate volume of blood received in culture bottles   Culture   Final    NO GROWTH 3 DAYS Performed at [facility], [place] [address], [place], [place] [address]    Report Status PENDING  Incomplete  Culture, blood (routine x 2)     Status: None (Preliminary result)   Collection Time: 01/13/20  4:04 PM   Specimen: BLOOD  Result Value Ref Range Status   Specimen Description BLOOD SITE NOT SPECIFIED  Final   Special Requests   Final    BOTTLES DRAWN AEROBIC ONLY Blood Culture results may not be optimal due to an inadequate volume of blood received in culture bottles   Culture   Final    NO GROWTH 3 DAYS Performed at [facility], [place] [address], [place], [place] [address]    Report Status PENDING  Incomplete  Surgical PCR screen     Status: None   Collection Time: 01/14/20  2:39  PM   Specimen: Nasal Mucosa; Nasal Swab  Result Value Ref Range Status   MRSA, PCR NEGATIVE NEGATIVE Final   Staphylococcus aureus NEGATIVE NEGATIVE Final    Comment: (NOTE) The Xpert SA Assay (FDA approved for NASAL specimens in patients 29 years of age and older), is one component of a comprehensive surveillance program. It is not intended to diagnose infection nor to guide or monitor treatment. Performed at [facility], [place] [address], [place], [place] [address]      Time coordinating discharge: Over 30 minutes  SIGNED:   Shawna Clamp, MD  [facility] 01/16/2020, 12:34 PM Pager   If 7PM-7AM, please contact night-coverage [email or web address]

## 2020-01-16 NOTE — Progress Notes (Signed)
D/C instructions given and reviewed with pt. Attempted to answer questions but pt kept interrupting and repeating questions. NT tried to answer questions as well but pt would also interrupt her and then continue to ask the same questions. Packet given and told her everything highlighted was an answer to her question. Tele and IV removed. Tolerated well. Awaiting transport home.

## 2020-01-16 NOTE — Progress Notes (Signed)
  HR somewhat recovered. BP remains high. Resume home irbesartan.   EP will see as needed while here. Please call with any questions.   Usual device follow up made.   PCP follow up for HTN and DM2.   Legrand Como [address]" [place], PA-C  01/16/2020 7:48 AM

## 2020-01-18 ENCOUNTER — Other Ambulatory Visit: Payer: Self-pay

## 2020-01-18 ENCOUNTER — Encounter (HOSPITAL_COMMUNITY): Payer: Self-pay | Admitting: Emergency Medicine

## 2020-01-18 ENCOUNTER — Inpatient Hospital Stay (HOSPITAL_COMMUNITY)
Admission: EM | Admit: 2020-01-18 | Discharge: 2020-01-23 | DRG: 189 | Disposition: A | Discharge status: Skilled Nursing Facility | Payer: Medicare Other | Attending: Internal Medicine | Admitting: Internal Medicine

## 2020-01-18 DIAGNOSIS — D696 Thrombocytopenia, unspecified: Secondary | ICD-10-CM | POA: Diagnosis present

## 2020-01-18 DIAGNOSIS — Z9981 Dependence on supplemental oxygen: Secondary | ICD-10-CM

## 2020-01-18 DIAGNOSIS — Z79899 Other long term (current) drug therapy: Secondary | ICD-10-CM

## 2020-01-18 DIAGNOSIS — Z87891 Personal history of nicotine dependence: Secondary | ICD-10-CM

## 2020-01-18 DIAGNOSIS — F0390 Unspecified dementia without behavioral disturbance: Secondary | ICD-10-CM | POA: Diagnosis present

## 2020-01-18 DIAGNOSIS — R531 Weakness: Secondary | ICD-10-CM | POA: Diagnosis not present

## 2020-01-18 DIAGNOSIS — I10 Essential (primary) hypertension: Secondary | ICD-10-CM | POA: Diagnosis present

## 2020-01-18 DIAGNOSIS — Z8249 Family history of ischemic heart disease and other diseases of the circulatory system: Secondary | ICD-10-CM

## 2020-01-18 DIAGNOSIS — J9811 Atelectasis: Secondary | ICD-10-CM | POA: Diagnosis not present

## 2020-01-18 DIAGNOSIS — R7989 Other specified abnormal findings of blood chemistry: Secondary | ICD-10-CM | POA: Diagnosis not present

## 2020-01-18 DIAGNOSIS — G9341 Metabolic encephalopathy: Secondary | ICD-10-CM | POA: Diagnosis present

## 2020-01-18 DIAGNOSIS — I442 Atrioventricular block, complete: Secondary | ICD-10-CM | POA: Diagnosis not present

## 2020-01-18 DIAGNOSIS — E1165 Type 2 diabetes mellitus with hyperglycemia: Secondary | ICD-10-CM | POA: Diagnosis present

## 2020-01-18 DIAGNOSIS — Z20822 Contact with and (suspected) exposure to covid-19: Secondary | ICD-10-CM | POA: Diagnosis not present

## 2020-01-18 DIAGNOSIS — N390 Urinary tract infection, site not specified: Secondary | ICD-10-CM | POA: Diagnosis present

## 2020-01-18 DIAGNOSIS — E785 Hyperlipidemia, unspecified: Secondary | ICD-10-CM | POA: Diagnosis not present

## 2020-01-18 DIAGNOSIS — I248 Other forms of acute ischemic heart disease: Secondary | ICD-10-CM | POA: Diagnosis not present

## 2020-01-18 DIAGNOSIS — J439 Emphysema, unspecified: Secondary | ICD-10-CM | POA: Diagnosis not present

## 2020-01-18 DIAGNOSIS — I700 Atherosclerosis of aorta: Secondary | ICD-10-CM | POA: Diagnosis not present

## 2020-01-18 DIAGNOSIS — Z7982 Long term (current) use of aspirin: Secondary | ICD-10-CM

## 2020-01-18 DIAGNOSIS — R0602 Shortness of breath: Secondary | ICD-10-CM | POA: Diagnosis not present

## 2020-01-18 DIAGNOSIS — N3001 Acute cystitis with hematuria: Secondary | ICD-10-CM | POA: Diagnosis not present

## 2020-01-18 DIAGNOSIS — F419 Anxiety disorder, unspecified: Secondary | ICD-10-CM | POA: Diagnosis present

## 2020-01-18 DIAGNOSIS — J449 Chronic obstructive pulmonary disease, unspecified: Secondary | ICD-10-CM | POA: Diagnosis present

## 2020-01-18 DIAGNOSIS — E669 Obesity, unspecified: Secondary | ICD-10-CM | POA: Diagnosis not present

## 2020-01-18 DIAGNOSIS — E1140 Type 2 diabetes mellitus with diabetic neuropathy, unspecified: Secondary | ICD-10-CM | POA: Diagnosis present

## 2020-01-18 DIAGNOSIS — R778 Other specified abnormalities of plasma proteins: Secondary | ICD-10-CM | POA: Diagnosis present

## 2020-01-18 DIAGNOSIS — D72829 Elevated white blood cell count, unspecified: Secondary | ICD-10-CM | POA: Diagnosis present

## 2020-01-18 DIAGNOSIS — R5381 Other malaise: Secondary | ICD-10-CM | POA: Diagnosis not present

## 2020-01-18 DIAGNOSIS — K219 Gastro-esophageal reflux disease without esophagitis: Secondary | ICD-10-CM | POA: Diagnosis present

## 2020-01-18 DIAGNOSIS — L89302 Pressure ulcer of unspecified buttock, stage 2: Secondary | ICD-10-CM | POA: Diagnosis present

## 2020-01-18 DIAGNOSIS — Z6831 Body mass index (BMI) 31.0-31.9, adult: Secondary | ICD-10-CM

## 2020-01-18 DIAGNOSIS — Z0189 Encounter for other specified special examinations: Secondary | ICD-10-CM | POA: Diagnosis present

## 2020-01-18 DIAGNOSIS — Z602 Problems related to living alone: Secondary | ICD-10-CM | POA: Diagnosis present

## 2020-01-18 DIAGNOSIS — J9621 Acute and chronic respiratory failure with hypoxia: Principal | ICD-10-CM | POA: Diagnosis present

## 2020-01-18 DIAGNOSIS — Z950 Presence of cardiac pacemaker: Secondary | ICD-10-CM | POA: Diagnosis present

## 2020-01-18 DIAGNOSIS — K5900 Constipation, unspecified: Secondary | ICD-10-CM | POA: Diagnosis present

## 2020-01-18 DIAGNOSIS — R410 Disorientation, unspecified: Secondary | ICD-10-CM | POA: Diagnosis not present

## 2020-01-18 DIAGNOSIS — R682 Dry mouth, unspecified: Secondary | ICD-10-CM | POA: Diagnosis present

## 2020-01-18 DIAGNOSIS — L89151 Pressure ulcer of sacral region, stage 1: Secondary | ICD-10-CM | POA: Diagnosis present

## 2020-01-18 DIAGNOSIS — Z794 Long term (current) use of insulin: Secondary | ICD-10-CM

## 2020-01-18 DIAGNOSIS — Z87440 Personal history of urinary (tract) infections: Secondary | ICD-10-CM

## 2020-01-18 DIAGNOSIS — IMO0002 Reserved for concepts with insufficient information to code with codable children: Secondary | ICD-10-CM | POA: Diagnosis present

## 2020-01-18 DIAGNOSIS — R52 Pain, unspecified: Secondary | ICD-10-CM | POA: Diagnosis not present

## 2020-01-18 LAB — CBG MONITORING, ED: Glucose-Capillary: 256 mg/dL — ABNORMAL HIGH (ref 70–99)

## 2020-01-18 LAB — CBC
HCT: 46.1 % — ABNORMAL HIGH (ref 36.0–46.0)
Hemoglobin: 13.9 g/dL (ref 12.0–15.0)
MCH: 26.2 pg (ref 26.0–34.0)
MCHC: 30.2 g/dL (ref 30.0–36.0)
MCV: 86.8 fL (ref 80.0–100.0)
Platelets: 206 10*3/uL (ref 150–400)
RBC: 5.31 MIL/uL — ABNORMAL HIGH (ref 3.87–5.11)
RDW: 15.3 % (ref 11.5–15.5)
WBC: 11.9 10*3/uL — ABNORMAL HIGH (ref 4.0–10.5)
nRBC: 0 % (ref 0.0–0.2)

## 2020-01-18 LAB — BASIC METABOLIC PANEL
Anion gap: 14 (ref 5–15)
BUN: 8 mg/dL (ref 8–23)
CO2: 28 mmol/L (ref 22–32)
Calcium: 8.9 mg/dL (ref 8.9–10.3)
Chloride: 94 mmol/L — ABNORMAL LOW (ref 98–111)
Creatinine, Ser: 0.76 mg/dL (ref 0.44–1.00)
GFR calc Af Amer: >60 mL/min (ref >60)
GFR calc non Af Amer: >60 mL/min (ref >60)
Glucose, Bld: 250 mg/dL — ABNORMAL HIGH (ref 70–99)
Potassium: 4 mmol/L (ref 3.5–5.1)
Sodium: 136 mmol/L (ref 135–145)

## 2020-01-18 LAB — CULTURE, BLOOD (ROUTINE X 2)
Culture: NO GROWTH
Culture: NO GROWTH

## 2020-01-18 MED ORDER — SODIUM CHLORIDE 0.9% FLUSH: 3.0000 mL | Freq: Once | INTRAVENOUS | Status: DC

## 2020-01-18 NOTE — ED Triage Notes (Addendum)
Pt to triage via [facility] from home.  Neighbor heard pt yelling and [employer] went for welfare check.  Pt discharged from hospital on Friday and states she has been sitting in chair since then unable to get up.  Oxygen tank empty.  Sats 85% initially.  Pt placed on 2 liters [place] by [facility].  Reports generalized weakness.  Incontinent on arrival.  Pt cleaned at triage.

## 2020-01-19 ENCOUNTER — Emergency Department (HOSPITAL_COMMUNITY): Payer: Medicare Other

## 2020-01-19 ENCOUNTER — Encounter (HOSPITAL_COMMUNITY): Payer: Self-pay | Admitting: Internal Medicine

## 2020-01-19 DIAGNOSIS — F0390 Unspecified dementia without behavioral disturbance: Secondary | ICD-10-CM | POA: Diagnosis not present

## 2020-01-19 DIAGNOSIS — D696 Thrombocytopenia, unspecified: Secondary | ICD-10-CM | POA: Diagnosis not present

## 2020-01-19 DIAGNOSIS — N3000 Acute cystitis without hematuria: Secondary | ICD-10-CM | POA: Diagnosis not present

## 2020-01-19 DIAGNOSIS — M48061 Spinal stenosis, lumbar region without neurogenic claudication: Secondary | ICD-10-CM | POA: Diagnosis not present

## 2020-01-19 DIAGNOSIS — R531 Weakness: Secondary | ICD-10-CM | POA: Diagnosis not present

## 2020-01-19 DIAGNOSIS — E1142 Type 2 diabetes mellitus with diabetic polyneuropathy: Secondary | ICD-10-CM | POA: Diagnosis not present

## 2020-01-19 DIAGNOSIS — I69828 Other speech and language deficits following other cerebrovascular disease: Secondary | ICD-10-CM | POA: Diagnosis not present

## 2020-01-19 DIAGNOSIS — R7989 Other specified abnormal findings of blood chemistry: Secondary | ICD-10-CM | POA: Diagnosis not present

## 2020-01-19 DIAGNOSIS — K5900 Constipation, unspecified: Secondary | ICD-10-CM | POA: Diagnosis present

## 2020-01-19 DIAGNOSIS — R778 Other specified abnormalities of plasma proteins: Secondary | ICD-10-CM

## 2020-01-19 DIAGNOSIS — M4180 Other forms of scoliosis, site unspecified: Secondary | ICD-10-CM | POA: Diagnosis not present

## 2020-01-19 DIAGNOSIS — I10 Essential (primary) hypertension: Secondary | ICD-10-CM

## 2020-01-19 DIAGNOSIS — J9811 Atelectasis: Secondary | ICD-10-CM | POA: Diagnosis not present

## 2020-01-19 DIAGNOSIS — J439 Emphysema, unspecified: Secondary | ICD-10-CM | POA: Diagnosis not present

## 2020-01-19 DIAGNOSIS — R41841 Cognitive communication deficit: Secondary | ICD-10-CM | POA: Diagnosis not present

## 2020-01-19 DIAGNOSIS — D72829 Elevated white blood cell count, unspecified: Secondary | ICD-10-CM | POA: Diagnosis not present

## 2020-01-19 DIAGNOSIS — I700 Atherosclerosis of aorta: Secondary | ICD-10-CM | POA: Diagnosis not present

## 2020-01-19 DIAGNOSIS — L89151 Pressure ulcer of sacral region, stage 1: Secondary | ICD-10-CM | POA: Diagnosis present

## 2020-01-19 DIAGNOSIS — J9621 Acute and chronic respiratory failure with hypoxia: Secondary | ICD-10-CM | POA: Diagnosis not present

## 2020-01-19 DIAGNOSIS — J449 Chronic obstructive pulmonary disease, unspecified: Secondary | ICD-10-CM

## 2020-01-19 DIAGNOSIS — E1165 Type 2 diabetes mellitus with hyperglycemia: Secondary | ICD-10-CM | POA: Diagnosis not present

## 2020-01-19 DIAGNOSIS — I248 Other forms of acute ischemic heart disease: Secondary | ICD-10-CM | POA: Diagnosis not present

## 2020-01-19 DIAGNOSIS — Z79899 Other long term (current) drug therapy: Secondary | ICD-10-CM | POA: Diagnosis not present

## 2020-01-19 DIAGNOSIS — M6281 Muscle weakness (generalized): Secondary | ICD-10-CM | POA: Diagnosis not present

## 2020-01-19 DIAGNOSIS — Z9981 Dependence on supplemental oxygen: Secondary | ICD-10-CM | POA: Diagnosis not present

## 2020-01-19 DIAGNOSIS — G9341 Metabolic encephalopathy: Secondary | ICD-10-CM

## 2020-01-19 DIAGNOSIS — I442 Atrioventricular block, complete: Secondary | ICD-10-CM | POA: Diagnosis not present

## 2020-01-19 DIAGNOSIS — Z9049 Acquired absence of other specified parts of digestive tract: Secondary | ICD-10-CM | POA: Diagnosis not present

## 2020-01-19 DIAGNOSIS — N390 Urinary tract infection, site not specified: Secondary | ICD-10-CM | POA: Diagnosis not present

## 2020-01-19 DIAGNOSIS — I959 Hypotension, unspecified: Secondary | ICD-10-CM | POA: Diagnosis not present

## 2020-01-19 DIAGNOSIS — Z950 Presence of cardiac pacemaker: Secondary | ICD-10-CM | POA: Diagnosis not present

## 2020-01-19 DIAGNOSIS — F419 Anxiety disorder, unspecified: Secondary | ICD-10-CM | POA: Diagnosis present

## 2020-01-19 DIAGNOSIS — Z8249 Family history of ischemic heart disease and other diseases of the circulatory system: Secondary | ICD-10-CM | POA: Diagnosis not present

## 2020-01-19 DIAGNOSIS — Z7401 Bed confinement status: Secondary | ICD-10-CM | POA: Diagnosis not present

## 2020-01-19 DIAGNOSIS — Z20822 Contact with and (suspected) exposure to covid-19: Secondary | ICD-10-CM | POA: Diagnosis not present

## 2020-01-19 DIAGNOSIS — Z87440 Personal history of urinary (tract) infections: Secondary | ICD-10-CM | POA: Diagnosis not present

## 2020-01-19 DIAGNOSIS — R2689 Other abnormalities of gait and mobility: Secondary | ICD-10-CM | POA: Diagnosis not present

## 2020-01-19 DIAGNOSIS — B9689 Other specified bacterial agents as the cause of diseases classified elsewhere: Secondary | ICD-10-CM | POA: Diagnosis not present

## 2020-01-19 DIAGNOSIS — E1140 Type 2 diabetes mellitus with diabetic neuropathy, unspecified: Secondary | ICD-10-CM | POA: Diagnosis not present

## 2020-01-19 DIAGNOSIS — M2550 Pain in unspecified joint: Secondary | ICD-10-CM | POA: Diagnosis not present

## 2020-01-19 DIAGNOSIS — E669 Obesity, unspecified: Secondary | ICD-10-CM | POA: Diagnosis not present

## 2020-01-19 DIAGNOSIS — R2681 Unsteadiness on feet: Secondary | ICD-10-CM | POA: Diagnosis not present

## 2020-01-19 DIAGNOSIS — Z6831 Body mass index (BMI) 31.0-31.9, adult: Secondary | ICD-10-CM | POA: Diagnosis not present

## 2020-01-19 DIAGNOSIS — L89302 Pressure ulcer of unspecified buttock, stage 2: Secondary | ICD-10-CM | POA: Diagnosis present

## 2020-01-19 DIAGNOSIS — J069 Acute upper respiratory infection, unspecified: Secondary | ICD-10-CM | POA: Diagnosis not present

## 2020-01-19 DIAGNOSIS — R5381 Other malaise: Secondary | ICD-10-CM | POA: Diagnosis not present

## 2020-01-19 DIAGNOSIS — E1149 Type 2 diabetes mellitus with other diabetic neurological complication: Secondary | ICD-10-CM | POA: Diagnosis not present

## 2020-01-19 DIAGNOSIS — E785 Hyperlipidemia, unspecified: Secondary | ICD-10-CM | POA: Diagnosis not present

## 2020-01-19 LAB — URINALYSIS, ROUTINE W REFLEX MICROSCOPIC
Bilirubin Urine: NEGATIVE
Glucose, UA: >=500 mg/dL — AB
Ketones, ur: 80 mg/dL — AB
Nitrite: NEGATIVE
Protein, ur: 30 mg/dL — AB
Specific Gravity, Urine: 1.015 (ref 1.005–1.030)
pH: 5 (ref 5.0–8.0)

## 2020-01-19 LAB — TROPONIN I (HIGH SENSITIVITY)
Troponin I (High Sensitivity): 68 ng/L — ABNORMAL HIGH (ref <18)
Troponin I (High Sensitivity): 71 ng/L — ABNORMAL HIGH (ref <18)

## 2020-01-19 LAB — GLUCOSE, CAPILLARY
Glucose-Capillary: 228 mg/dL — ABNORMAL HIGH (ref 70–99)
Glucose-Capillary: 255 mg/dL — ABNORMAL HIGH (ref 70–99)
Glucose-Capillary: 129 mg/dL — ABNORMAL HIGH (ref 70–99)

## 2020-01-19 LAB — D-DIMER, QUANTITATIVE: D-Dimer, Quant: 3.71 ug/mL-FEU — ABNORMAL HIGH (ref 0.00–0.50)

## 2020-01-19 MED ORDER — ORAL CARE MOUTH RINSE
15.0000 mL | Freq: Two times a day (BID) | OROMUCOSAL | Status: DC
  Administered 2020-01-19 – 2020-01-23 (×8): 15 mL via OROMUCOSAL

## 2020-01-19 MED ORDER — INSULIN GLARGINE 100 UNIT/ML ~~LOC~~ SOLN
56.0000 [IU] | Freq: Every day | SUBCUTANEOUS | Status: DC
  Filled 2020-01-19: qty 0.56

## 2020-01-19 MED ORDER — ASPIRIN 325 MG PO TABS
325.0000 mg | ORAL_TABLET | Freq: Every day | ORAL | Status: DC
  Administered 2020-01-19 – 2020-01-23 (×5): 325 mg via ORAL
  Filled 2020-01-19 (×5): qty 1

## 2020-01-19 MED ORDER — IRBESARTAN 75 MG PO TABS
150.0000 mg | ORAL_TABLET | Freq: Every day | ORAL | Status: DC
  Administered 2020-01-19 – 2020-01-21 (×3): 150 mg via ORAL
  Filled 2020-01-19: qty 2
  Filled 2020-01-19: qty 1
  Filled 2020-01-19 (×2): qty 2

## 2020-01-19 MED ORDER — ACETAMINOPHEN 650 MG RE SUPP: 650.0000 mg | Freq: Four times a day (QID) | RECTAL | Status: DC | PRN

## 2020-01-19 MED ORDER — PRAVASTATIN SODIUM 10 MG PO TABS
20.0000 mg | ORAL_TABLET | Freq: Every day | ORAL | Status: DC
  Administered 2020-01-19 – 2020-01-23 (×5): 20 mg via ORAL
  Filled 2020-01-19 (×5): qty 2

## 2020-01-19 MED ORDER — SODIUM CHLORIDE 0.9 % IV SOLN
1.0000 g | Freq: Once | INTRAVENOUS | Status: AC
  Administered 2020-01-19: 1 g via INTRAVENOUS
  Filled 2020-01-19: qty 10

## 2020-01-19 MED ORDER — INSULIN ASPART 100 UNIT/ML ~~LOC~~ SOLN
0.0000 [IU] | Freq: Three times a day (TID) | SUBCUTANEOUS | Status: DC
  Administered 2020-01-19: 11 [IU] via SUBCUTANEOUS
  Administered 2020-01-19: 3 [IU] via SUBCUTANEOUS
  Administered 2020-01-20: 7 [IU] via SUBCUTANEOUS
  Administered 2020-01-20 – 2020-01-21 (×4): 4 [IU] via SUBCUTANEOUS
  Administered 2020-01-21 – 2020-01-22 (×4): 7 [IU] via SUBCUTANEOUS
  Administered 2020-01-23: 11 [IU] via SUBCUTANEOUS
  Administered 2020-01-23: 7 [IU] via SUBCUTANEOUS

## 2020-01-19 MED ORDER — SODIUM CHLORIDE 0.9% FLUSH
3.0000 mL | Freq: Two times a day (BID) | INTRAVENOUS | Status: DC
  Administered 2020-01-19 – 2020-01-23 (×7): 3 mL via INTRAVENOUS

## 2020-01-19 MED ORDER — ALPRAZOLAM 0.25 MG PO TABS
0.2500 mg | ORAL_TABLET | Freq: Two times a day (BID) | ORAL | Status: DC | PRN
  Administered 2020-01-21: 0.25 mg via ORAL
  Filled 2020-01-19 (×2): qty 1

## 2020-01-19 MED ORDER — SODIUM CHLORIDE 0.9 % IV BOLUS (SEPSIS)
500.0000 mL | Freq: Once | INTRAVENOUS | Status: AC
  Administered 2020-01-19: 500 mL via INTRAVENOUS

## 2020-01-19 MED ORDER — ENOXAPARIN SODIUM 40 MG/0.4ML ~~LOC~~ SOLN
40.0000 mg | SUBCUTANEOUS | Status: DC
  Administered 2020-01-19 – 2020-01-23 (×5): 40 mg via SUBCUTANEOUS
  Filled 2020-01-19 (×5): qty 0.4

## 2020-01-19 MED ORDER — METOPROLOL TARTRATE 5 MG/5ML IV SOLN
5.0000 mg | Freq: Once | INTRAVENOUS | Status: AC
  Administered 2020-01-19: 5 mg via INTRAVENOUS
  Filled 2020-01-19: qty 5

## 2020-01-19 MED ORDER — INSULIN DEGLUDEC 200 UNIT/ML ~~LOC~~ SOPN: 56.0000 [IU] | PEN_INJECTOR | Freq: Every day | SUBCUTANEOUS | Status: DC

## 2020-01-19 MED ORDER — METOPROLOL SUCCINATE ER 50 MG PO TB24
50.0000 mg | ORAL_TABLET | Freq: Every day | ORAL | Status: DC
  Administered 2020-01-19 – 2020-01-23 (×5): 50 mg via ORAL
  Filled 2020-01-19 (×5): qty 1

## 2020-01-19 MED ORDER — IOHEXOL 350 MG/ML SOLN
80.0000 mL | Freq: Once | INTRAVENOUS | Status: AC | PRN
  Administered 2020-01-19: 80 mL via INTRAVENOUS

## 2020-01-19 MED ORDER — SODIUM CHLORIDE 0.9 % IV SOLN
1000.0000 mL | INTRAVENOUS | Status: DC
  Administered 2020-01-19: 1000 mL via INTRAVENOUS

## 2020-01-19 MED ORDER — LEVALBUTEROL HCL 0.63 MG/3ML IN NEBU: 0.6300 mg | INHALATION_SOLUTION | Freq: Four times a day (QID) | RESPIRATORY_TRACT | Status: DC | PRN

## 2020-01-19 MED ORDER — BUDESON-GLYCOPYRROL-FORMOTEROL 160-9-4.8 MCG/ACT IN AERO: 2.0000 | INHALATION_SPRAY | Freq: Two times a day (BID) | RESPIRATORY_TRACT | Status: DC

## 2020-01-19 MED ORDER — SODIUM CHLORIDE 0.9 % IV SOLN
2.0000 g | INTRAVENOUS | Status: DC
  Filled 2020-01-19: qty 20

## 2020-01-19 MED ORDER — INSULIN GLARGINE 100 UNIT/ML ~~LOC~~ SOLN
20.0000 [IU] | Freq: Every day | SUBCUTANEOUS | Status: DC
  Administered 2020-01-19 – 2020-01-22 (×4): 20 [IU] via SUBCUTANEOUS
  Filled 2020-01-19 (×5): qty 0.2

## 2020-01-19 MED ORDER — UMECLIDINIUM BROMIDE 62.5 MCG/INH IN AEPB
1.0000 | INHALATION_SPRAY | Freq: Every day | RESPIRATORY_TRACT | Status: DC
  Filled 2020-01-19: qty 7

## 2020-01-19 MED ORDER — ACETAMINOPHEN 325 MG PO TABS
650.0000 mg | ORAL_TABLET | Freq: Four times a day (QID) | ORAL | Status: DC | PRN
  Administered 2020-01-20 – 2020-01-23 (×3): 650 mg via ORAL
  Filled 2020-01-19 (×4): qty 2

## 2020-01-19 MED ORDER — SODIUM CHLORIDE 3 % IN NEBU
4.0000 mL | INHALATION_SOLUTION | Freq: Four times a day (QID) | RESPIRATORY_TRACT | Status: AC | PRN
  Filled 2020-01-19: qty 4

## 2020-01-19 MED ORDER — FLUTICASONE FUROATE-VILANTEROL 100-25 MCG/INH IN AEPB
1.0000 | INHALATION_SPRAY | Freq: Every day | RESPIRATORY_TRACT | Status: DC
  Filled 2020-01-19: qty 28

## 2020-01-19 NOTE — ED Notes (Signed)
Patient transported to CT 

## 2020-01-19 NOTE — ED Notes (Signed)
Called CT to inform pt is ready for CTA with new IV placed

## 2020-01-19 NOTE — H&P (Signed)
History and Physical    [name] STM:[id number] DOB: February 18, 1936 DOA: 01/18/2020  Referring MD/NP/PA: Addison Lank, MD PCP: [name], MD Patient coming from: Home via [facility]  Chief Complaint: Weakness   I have personally briefly reviewed patient's old medical records in [place]   HPI: [name] is a 84 y.o. female with medical history significant of hypertension, hyperlipidemia, diabetes mellitus type 2 with neuropathy, chronic hypoxic respiratory failure on 2-3 L home, COPD, GERD, obesity, and obesity.  She presents after her caregiver possibly called [employer] when performed a welfare check and found the patient sitting in a chair with O2 saturations 85% room air as her oxygen tanks were empty.  History is mostly obtained from review of records as the patient admits to having issues with her memory.  Patient had just recently been hospitalized 9/7-9/10 for acute metabolic encephalopathy thought secondary to heart block and hypoxia with signs of Klebsiella UTI for which she was treated with 3 days of Rocephin.  At baseline patient lives alone and has a caregiver that comes and checks on her.  Does not quite know why she is here in the hospital.  She reports that her memory sometimes comes and goes.  She denies having any dysuria or urinary frequency symptoms, but reports that she has had cystitis quite frequently since 1970 for which she is followed by [facility].  She currently denies having any wheezing, shortness of breath, or chest pain.  Patient reports that she has no family around and  [name] is her caregiver that helps take care of home.  ED Course: Upon admission into the emergency department patient was seen to be afebrile, pulse 59-123, respirations 18-32, blood pressures 125/48-165/74, and O2 saturations maintained on 4 L of oxygen.  Labs from 9/12 significant for WBC 11.9, glucose 250 without elevated anion gap, high-sensitivity troponin  68->71, and D-dimer 3.71.  Urinalysis was positive for large leukocytes, few bacteria, 6-10 squamous epithelial cells, and 21-50 WBCs.  CTA of the chest did not show any signs of a pulmonary embolus or infiltrate.  Patient had been given 5 mg of metoprolol IV, metoprolol succinate 50 mg p.o., and Rocephin IV.  Review of Systems  Unable to perform ROS: Other (Due to issues with memory.)  Eyes: Negative for photophobia and pain.  Respiratory: Negative for cough, shortness of breath and wheezing.   Cardiovascular: Negative for chest pain.  Gastrointestinal: Negative for abdominal pain, nausea and vomiting.  Genitourinary: Negative for dysuria and frequency.  Neurological: Negative for loss of consciousness.  Psychiatric/Behavioral: Positive for memory loss.    Past Medical History:  Diagnosis Date  . Allergic rhinitis, cause unspecified    Sinus CT Rec 12-23-2009  . Arthritis   . Benign paroxysmal positional vertigo 08/09/2012  . 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 Complication of anesthesia    takes along time to wake up  . Diabetes mellitus   . Diarrhea   . Diverticulosis   . Esophageal reflux   . Esophageal stricture   . GERD (gastroesophageal reflux disease)   . Glaucoma   . Hiatal hernia   . Hypertension   . Irritable bowel syndrome   . Neuropathy in diabetes ([place]) 08/09/2012  . Other chronic nonalcoholic liver disease   . Oxygen deficiency   . Sciatica   . Scoliosis   . Shingles 2010  . Shortness of breath   . Steatohepatitis  Past Surgical History:  Procedure Laterality Date  . APPENDECTOMY    . CARPAL TUNNEL RELEASE     right hand  . CHOLECYSTECTOMY OPEN  1978  . COLONOSCOPY  07-2001   mild diverticulosis  . ESOPHAGOGASTRODUODENOSCOPY  [id number]   H Hernia,es.stricture s/p dil 54F  . INTRAOCULAR LENS INSERTION Bilateral   . LIVER BIOPSY  [phone]  . PACEMAKER IMPLANT N/A 01/14/2020   Procedure: PACEMAKER  IMPLANT;  Surgeon: Evans Lance, MD;  Location: [facility];  Service: Cardiovascular;  Laterality: N/A;  . PARATHYROID EXPLORATION    . TONSILLECTOMY AND ADENOIDECTOMY    . TOTAL ABDOMINAL HYSTERECTOMY    . ULNAR NERVE TRANSPOSITION  12/07/2011   Procedure: ULNAR NERVE DECOMPRESSION/TRANSPOSITION;this was cancelled-not done  Surgeon: Cammie Sickle., MD;  Location: [facility];  Service: Orthopedics;  Laterality: Right;  right ulnar nerve in situ decompression  . ULNAR TUNNEL RELEASE  03/07/2012   Procedure: CUBITAL TUNNEL RELEASE;  Surgeon: Roseanne Kaufman, MD;  Location: [facility];  Service: Orthopedics;  Laterality: Right;  ulnar nerve release at the elbow       reports that she quit smoking about 31 years ago. Her smoking use included cigarettes. She has never used smokeless tobacco. She reports that she does not drink alcohol and does not use drugs.  Allergies  Allergen Reactions  . Chlordiazepoxide-Clidinium Other (See Comments)    Sleepy,weak  . Biaxin [Clarithromycin] Other (See Comments)    Foul taste, abd pain, diarrhea  . Tramadol Other (See Comments)    Caused tremors  . Codeine Other (See Comments)    REACTION: gi upset- only in high doses per pt.  Can tolerate cough syrup with codeine.     Family History  Problem Relation Age of Onset  . Heart disease Father   . Lung cancer Mother        small cell;Byssinosis  . Lung cancer Sister   . Liver cancer Sister        ? mets from another area of the body  . Diabetes Other        grandmother  . Stroke Maternal Grandfather     Prior to Admission medications   Medication Sig Start Date End Date Taking? Authorizing Provider  ALPRAZolam (XANAX) 0.5 MG tablet TAKE 1/2 TO 1 TABLET TWICE DAILY AS NEEDED FOR ANXIETY. Patient taking differently: Take 0.25 mg by mouth as needed for anxiety or sleep.  09/02/19  Yes [name], MD  aspirin 325 MG tablet Take 325 mg by mouth daily.     Yes  [provider]  Budeson-Glycopyrrol-Formoterol (BREZTRI AEROSPHERE) 160-9-4.8 MCG/ACT AERO Inhale 2 puffs into the lungs 2 (two) times daily. 12/26/19  Yes Young, Tarri Fuller D, MD  insulin aspart (NOVOLOG FLEXPEN) 100 UNIT/ML FlexPen Inject 12-14 units under the skin at breakfast 14-16 at lunch, and 12-16 at dinner. 10/03/19  Yes [name], MD  insulin degludec (TRESIBA FLEXTOUCH) 200 UNIT/ML FlexTouch Pen Inject 58 Units into the skin daily. Patient taking differently: Inject 56 Units into the skin daily.  10/03/19  Yes [name], MD  ipratropium-albuterol (DUONEB) 0.5-2.5 (3) MG/3ML SOLN 1 vial in neb every 8 hours and as needed Patient taking differently: Take 3 mLs by nebulization every 8 (eight) hours as needed (shortness of breath).  03/01/18  Yes Young, Tarri Fuller D, MD  irbesartan (AVAPRO) 150 MG tablet Take 1 tablet (150 mg total) by mouth daily. 11/05/19  Yes [name], MD  levalbuterol (  XOPENEX HFA) 45 MCG/ACT inhaler Inhale 1-2 puffs into the lungs every 6 (six) hours as needed for wheezing. 12/30/19  Yes Baird Lyons D, MD  magic mouthwash SOLN Swish with 2 teaspoonfuls by mouth as needed 12/03/19  Yes [name], MD  omeprazole (PRILOSEC) 20 MG capsule TAKE (1) CAPSULE DAILY. Patient taking differently: Take 20 mg by mouth daily.  12/10/19  Yes [name], MD  OXYGEN Inhale 3 L into the lungs continuous.    Yes [provider]  pravastatin (PRAVACHOL) 20 MG tablet Take 1 tablet (20 mg total) by mouth daily. 01/05/20  Yes [name], MD  sodium chloride HYPERTONIC 3 % nebulizer solution 1 ampule neb every 6 hours if needed to clear mucus 08/14/17  Yes Young, Clinton D, MD  STIOLTO RESPIMAT 2.5-2.5 MCG/ACT AERS USE 2 PUFFS DAILY Patient taking differently: Inhale 2 puffs into the lungs daily.  11/05/19  Yes Young, Kasandra Knudsen, MD  ACCU-CHEK SOFTCLIX LANCETS lancets 1 each by Other route daily. Use as instructed to check blood sugar 8 times daily. 05/09/18   [name], MD  B-D  UF III MINI PEN NEEDLES 31G X 5 MM MISC USE 5 PER DAY TO INJECT INSULIN 12/07/17   [name], MD  glucose blood (ACCU-CHEK AVIVA PLUS) test strip Use as instructed to check blood sugar 4 times a day. DX:E11.65 08/14/19   [name], MD  Insulin Pen Needle (B-D ULTRAFINE III SHORT PEN) 31G X 8 MM MISC TEST 3 TIMES A DAY 12/05/17   [name], MD  Insulin Syringe-Needle U-100 (BD INSULIN SYRINGE ULTRAFINE) 31G X 15/64" 0.5 ML MISC Use 2 per day dx code E11.65 07/21/15   [name], MD  Lancets Misc. (ACCU-CHEK SOFTCLIX LANCET DEV) KIT 1 each by Does not apply route daily. Use as instructed to check blood sugar 8 times daily. 05/09/18   [name], MD  metoprolol succinate (TOPROL-XL) 50 MG 24 hr tablet Take 1 tablet (50 mg total) by mouth daily. Take with or immediately following a meal. 01/17/20   Shawna Clamp, MD  Spacer/Aero-Holding Josiah Lobo (AEROCHAMBER MV) inhaler Use as instructed 08/28/17   Deneise Lever, MD    Physical Exam:  Constitutional: Elderly female who appears to be in no acute distress at this time Vitals:   01/19/20 0145 01/19/20 0430 01/19/20 0435 01/19/20 0445  BP: (!) 145/94 (!) 169/74  (!) 152/63  Pulse: (!) 122 (!) 119 100 (!) 102  Resp: (!) 25 (!) 25 20 (!) 28  Temp:      TempSrc:      SpO2: 99% 91% (!) 88% 94%  Weight:      Height:       Eyes: PERRL, lids and conjunctivae normal ENMT: Mucous membranes are moist. Posterior pharynx clear of any exudate or lesions.   Neck: normal, supple, no masses, no thyromegaly Respiratory: No significant wheezes or rhonchi appreciated.  Currently maintaining O2 saturations on 2 L of nasal cannula oxygen.  Cardiovascular: Regular rate and rhythm, no murmurs / rubs / gallops. No extremity edema. 2+ pedal pulses. No carotid bruits.  Abdomen: no tenderness, no masses palpated. No hepatosplenomegaly. Bowel sounds positive.  Musculoskeletal: no clubbing / cyanosis. No joint deformity upper and lower extremities. Good ROM, no  contractures. Normal muscle tone.  Skin: no rashes, lesions, ulcers. No induration Neurologic: CN 2-12 grossly intact. Sensation intact, DTR normal. Strength 5/5 in all 4.  Psychiatric: Poor recent memory. Alert and oriented x 3. Normal mood.  Labs on Admission: I have personally reviewed following labs and imaging studies  CBC: Recent Labs  Lab 01/13/20 1213 01/13/20 1213 01/13/20 1226 01/13/20 1603 01/14/20 0338 01/15/20 0708 01/18/20 1727  WBC 10.0  --   --  10.2 12.2* 10.4 11.9*  NEUTROABS 8.0*  --   --   --   --   --   --   HGB 12.5   < > 13.9 13.6 13.5 12.9 13.9  HCT 41.0   < > 41.0 44.1 42.4 43.0 46.1*  MCV 86.3  --   --  85.0 85.8 87.9 86.8  PLT 167  --   --  PLATELET CLUMPS NOTED ON SMEAR, UNABLE TO ESTIMATE 176 162 206   < > = values in this interval not displayed.   Basic Metabolic Panel: Recent Labs  Lab 01/13/20 1213 01/13/20 1226 01/13/20 1603 01/14/20 0338 01/15/20 0708 01/18/20 1727  NA 134* 134*  --  136 139 136  K 4.8 4.7  --  4.5 4.3 4.0  CL 96*  --   --  98 100 94*  CO2 28  --   --  _0 GLUCOSE 323*  --   --  225* 172* 250*  BUN 15  --   --  24* 21 8  CREATININE 0.78  --  0.82 0.91 0.72 0.76  CALCIUM 9.0  --   --  9.0 8.7* 8.9  MG  --   --  1.7  --  2.3  --   PHOS  --   --  3.4  --  3.5  --    GFR: Estimated Creatinine Clearance: 53.3 mL/min (by C-G formula based on SCr of 0.76 mg/dL). Liver Function Tests: Recent Labs  Lab 01/13/20 1213 01/14/20 0338  AST 20 23  ALT 15 16  ALKPHOS 50 50  BILITOT 0.8 0.8  PROT 7.1 6.5  ALBUMIN 3.4* 3.1*   No results for input(s): LIPASE, AMYLASE in the last 168 hours. No results for input(s): AMMONIA in the last 168 hours. Coagulation Profile: No results for input(s): INR, PROTIME in the last 168 hours. Cardiac Enzymes: No results for input(s): CKTOTAL, CKMB, CKMBINDEX, TROPONINI in the last 168 hours. BNP (last 3 results) No results for input(s): PROBNP in the last 8760  hours. HbA1C: No results for input(s): HGBA1C in the last 72 hours. CBG: Recent Labs  Lab 01/15/20 2137 01/16/20 0616 01/16/20 1123 01/16/20 1604 01/18/20 2337  GLUCAP 210* 93 172* 204* 256*   Lipid Profile: No results for input(s): CHOL, HDL, LDLCALC, TRIG, CHOLHDL, LDLDIRECT in the last 72 hours. Thyroid Function Tests: No results for input(s): TSH, T4TOTAL, FREET4, T3FREE, THYROIDAB in the last 72 hours. Anemia Panel: No results for input(s): VITAMINB12, FOLATE, FERRITIN, TIBC, IRON, RETICCTPCT in the last 72 hours. Urine analysis:    Component Value Date/Time   COLORURINE YELLOW 01/18/2020 1659   APPEARANCEUR HAZY (A) 01/18/2020 1659   LABSPEC 1.015 01/18/2020 1659   PHURINE 5.0 01/18/2020 1659   GLUCOSEU >=500 (A) 01/18/2020 1659   GLUCOSEU 100 (A) 05/11/2017 1015   HGBUR SMALL (A) 01/18/2020 1659   BILIRUBINUR NEGATIVE 01/18/2020 1659   BILIRUBINUR negative 01/25/2017 1616   KETONESUR 80 (A) 01/18/2020 1659   PROTEINUR 30 (A) 01/18/2020 1659   UROBILINOGEN 0.2 05/11/2017 1015   NITRITE NEGATIVE 01/18/2020 1659   LEUKOCYTESUR LARGE (A) 01/18/2020 1659   Sepsis Labs: Recent Results (from the past 240 hour(s))  Urine culture     Status: Abnormal  Collection Time: 01/13/20 12:42 PM   Specimen: Urine, Catheterized  Result Value Ref Range Status   Specimen Description URINE, CATHETERIZED  Final   Special Requests NONE  Final   Culture (A)  Final    >=100,000 COLONIES/mL KLEBSIELLA PNEUMONIAE >=100,000 COLONIES/mL LACTOBACILLUS SPECIES Standardized susceptibility testing for this organism is not available. Performed at [facility], [place] [address], [place], [place] [address]    Report Status 01/16/2020 FINAL  Final   Organism ID, Bacteria KLEBSIELLA PNEUMONIAE (A)  Final      Susceptibility   Klebsiella pneumoniae - MIC*    AMPICILLIN >=32 RESISTANT Resistant     CEFAZOLIN <=4 SENSITIVE Sensitive     CEFTRIAXONE <=0.25 SENSITIVE Sensitive      CIPROFLOXACIN <=0.25 SENSITIVE Sensitive     GENTAMICIN <=1 SENSITIVE Sensitive     IMIPENEM <=0.25 SENSITIVE Sensitive     NITROFURANTOIN 128 RESISTANT Resistant     TRIMETH/SULFA <=20 SENSITIVE Sensitive     AMPICILLIN/SULBACTAM 8 SENSITIVE Sensitive     * >=100,000 COLONIES/mL KLEBSIELLA PNEUMONIAE  SARS Coronavirus 2 by RT PCR (hospital order, performed in [place] hospital lab) Nasopharyngeal Nasopharyngeal Swab     Status: None   Collection Time: 01/13/20  1:22 PM   Specimen: Nasopharyngeal Swab  Result Value Ref Range Status   SARS Coronavirus 2 NEGATIVE NEGATIVE Final    Comment: (NOTE) SARS-CoV-2 target nucleic acids are NOT DETECTED.  The SARS-CoV-2 RNA is generally detectable in upper and lower respiratory specimens during the acute phase of infection. The lowest concentration of SARS-CoV-2 viral copies this assay can detect is 250 copies / mL. A negative result does not preclude SARS-CoV-2 infection and should not be used as the sole basis for treatment or other patient management decisions.  A negative result may occur with improper specimen collection / handling, submission of specimen other than nasopharyngeal swab, presence of viral mutation(s) within the areas targeted by this assay, and inadequate number of viral copies (<250 copies / mL). A negative result must be combined with clinical observations, patient history, and epidemiological information.  Fact Sheet for Patients:   StrictlyIdeas.no  Fact Sheet for Healthcare Providers: BankingDealers.co.za  This test is not yet approved or  cleared by the [place] FDA and has been authorized for detection and/or diagnosis of SARS-CoV-2 by FDA under an Emergency Use Authorization (EUA).  This EUA will remain in effect (meaning this test can be used) for the duration of the COVID-19 declaration under Section 564(b)(1) of the Act, 21 U.S.C. section 360bbb-3(b)(1),  unless the authorization is terminated or revoked sooner.  Performed at [facility], [place] [address], [place], [place] [address]   Culture, blood (routine x 2)     Status: None   Collection Time: 01/13/20  4:03 PM   Specimen: BLOOD  Result Value Ref Range Status   Specimen Description BLOOD SITE NOT SPECIFIED  Final   Special Requests   Final    BOTTLES DRAWN AEROBIC AND ANAEROBIC Blood Culture results may not be optimal due to an inadequate volume of blood received in culture bottles   Culture   Final    NO GROWTH 5 DAYS Performed at [facility], [place] [address] [place], [place] [address]    Report Status 01/18/2020 FINAL  Final  Culture, blood (routine x 2)     Status: None   Collection Time: 01/13/20  4:04 PM   Specimen: BLOOD  Result Value Ref Range Status  Specimen Description BLOOD SITE NOT SPECIFIED  Final   Special Requests   Final    BOTTLES DRAWN AEROBIC ONLY Blood Culture results may not be optimal due to an inadequate volume of blood received in culture bottles   Culture   Final    NO GROWTH 5 DAYS Performed at [facility], [place] [address] [place], [place] [address]    Report Status 01/18/2020 FINAL  Final  Surgical PCR screen     Status: None   Collection Time: 01/14/20  2:39 PM   Specimen: Nasal Mucosa; Nasal Swab  Result Value Ref Range Status   MRSA, PCR NEGATIVE NEGATIVE Final   Staphylococcus aureus NEGATIVE NEGATIVE Final    Comment: (NOTE) The Xpert SA Assay (FDA approved for NASAL specimens in patients 93 years of age and older), is one component of a comprehensive surveillance program. It is not intended to diagnose infection nor to guide or monitor treatment. Performed at [facility], [place] [address] [place], [place] [address]      Radiological Exams on Admission: DG Chest 2 View  Result Date: 01/19/2020 CLINICAL DATA:  Generalized weakness. EXAM: CHEST - 2 VIEW COMPARISON:  January 15, 2020 FINDINGS: A dual lead  AICD is noted. There is no evidence of acute infiltrate, pleural effusion or pneumothorax. The cardiac silhouette is mildly enlarged and unchanged in size. Marked severity calcification of the aortic arch is noted. The visualized skeletal structures are unremarkable. IMPRESSION: No active cardiopulmonary disease. Electronically Signed   By: Virgina Norfolk M.D.   On: 01/19/2020 01:15   CT Angio Chest PE W and/or Wo Contrast  Result Date: 01/19/2020 CLINICAL DATA:  Low to intermediate probability for pulmonary embolism. Positive D-dimer. EXAM: CT ANGIOGRAPHY CHEST WITH CONTRAST TECHNIQUE: Multidetector CT imaging of the chest was performed using the standard protocol during bolus administration of intravenous contrast. Multiplanar CT image reconstructions and MIPs were obtained to evaluate the vascular anatomy. CONTRAST:  30m OMNIPAQUE IOHEXOL 350 MG/ML SOLN COMPARISON:  None. FINDINGS: Cardiovascular: Normal heart size. No pericardial effusion. There is aortic and coronary atherosclerosis. No pulmonary artery filling defect Mediastinum/Nodes: Negative for adenopathy or mass. Lungs/Pleura: The central airways are clear. Right posterior costophrenic sulcus opacity with volume loss attributed atelectasis. Minimal apical emphysema. Upper Abdomen: Lobulated liver raising the possibility of cirrhosis, also seen described on a 2018 CT. Cholecystectomy. No acute finding. Musculoskeletal: No acute or aggressive finding. Review of the MIP images confirms the above findings. IMPRESSION: 1. No acute finding, including pulmonary embolism. 2. Mild dependent atelectasis. 3. Aortic Atherosclerosis (ICD10-I70.0). Electronically Signed   By: JMonte FantasiaM.D.   On: 01/19/2020 05:01    EKG: Independently reviewed.  Ventricularly paced rhythm at 123 bpm  Assessment/Plan Acute metabolic encephalopathy: Patient noted to be altered on admission.  No focal deficits appreciated.  She had a similar presentation during last  admission, but not totally clear of symptoms are all related to hypoxia or  infection.  Question possibility of underlying dementia she admits to having issues with her memory. -Admit to a medical telemetry bed -Neurochecks -PT/OT consult -Transitions of care consult as question patient safety to live alone  Suspect urinary tract infection: Present prior to arrival.  Urine cultures from last hospitalization grew out Klebsiella which was sensitive to Rocephin.  Patient had been started on Rocephin IV.  She reports history of cystitis, but denies any dysuria or urinary frequency symptoms at this time.  -Admit to medical telemetry -Recheck urine culture -Continue  empiric antibiotics of Rocephin  Acute on chronic respiratory failure with hypoxia secondary to COPD: Patient has been at home and oxygen had ran out.  She was found to be hypoxic down to 85% on room air, but currently maintaining O2 saturations on home 2 L.  No wheezing appreciated on physical exam.  Chest x-ray otherwise clear for any acute abnormalities.  -Continuous pulse oximetry with 2 L nasal cannula oxygen.  Leukocytosis: Acute.  WBC elevated 11.9.  Suspect possibly related with above -Continue to monitor  Elevated troponin: Troponin elevated 68-> 71 on admission.  Patient denies any reports of chest pain and troponins appear to be decreased from previous admission.   Complete heart block s/p pacemaker: Patient status post pacemaker on 01/14/2020 by Dr. Lovena Le.  Noted to be in ventricularly paced rhythm.  Chronic respiratory failure with hypoxia: At home patient on 2-3 L continuously.  CTA of the chest did not show any signs of a PE. -Continuous pulse oximetry with nasal cannula oxygen 3 L -Continue home inhalers   Diabetes mellitus type 2, uncontrolled with hyperglycemia: On admission glucose elevated at 250.  Last hemoglobin A1c noted to be 9.8 on 01/14/2020.  Home medication regimen includes Tresiba 56 units subcutaneous daily  and NovoLog 12 - 16 units with 3 times daily with meals.  Diabetic educator noted during last hospitalization patient was receiving Lantus 20 units daily. -Hypoglycemic protocols -We will restart Lantus 20 units daily -CBGs before every meal with resistant SSI -Adjust insulin regimen as needed  Essential hypertension: Home blood pressure medications include irbesartan 150 mg daily and metoprolol succinate 50 mg daily. -Continue home regimen  Anxiety -Continue Xanax as needed  Hyperlipidemia: Home medication regimen includes pravastatin 20 mg daily. -Continue statin  Obesity: BMI 30.11 kg/m  DVT prophylaxis: Lovenox Code Status: Full Family Communication: Attempted to call patient's caregiver with no answer Disposition Plan:   To be determined Consults called: none  Admission status: Inpatient  Norval Morton MD [facility] Pager [phone]   If 7PM-7AM, please contact night-coverage [email or web address] Password [email or web address]  01/19/2020, 8:02 AM

## 2020-01-19 NOTE — ED Notes (Signed)
Pt returned from CT d/t infiltration of IV. Dr. Leonette Monarch at bedside

## 2020-01-19 NOTE — Progress Notes (Signed)
Inpatient Diabetes Program Recommendations  AACE/ADA: New Consensus Statement on Inpatient Glycemic Control (2015)  Target Ranges:  Prepandial:   less than 140 mg/dL      Peak postprandial:   less than 180 mg/dL (1-2 hours)      Critically ill patients:  140 - 180 mg/dL   Lab Results  Component Value Date   GLUCAP 255 (H) 01/19/2020   HGBA1C 9.8 (H) 01/14/2020    Review of Glycemic Control Results for JOELYS, STAUBS (MRN [id number]) as of 01/19/2020 12:19  Ref. Range 01/18/2020 23:37 01/19/2020 10:00 01/19/2020 11:21  Glucose-Capillary Latest Ref Range: 70 - 99 mg/dL 256 (H) 228 (H) 255 (H)   Diabetes history: DM 2 Outpatient Diabetes medications:  Novolog 14-16 units with breakfast and lunch and Novolog 12-16 units with supper Tresiba 58 units daily Current orders for Inpatient glycemic control:  Novolog resistant tid with meals  Lantus 56 units QD  Inpatient Diabetes Program Recommendations:    Patient readmitted. Consider previous inpatient dosage of basal from 01/15/2020: Decrease Lantus to 20 units qd . A1C is improved from June 2021.  Secure chat sent to RN and MD.   Thanks, Bronson Curb, MSN, RNC-OB Diabetes Coordinator [phone] (8a-5p)

## 2020-01-19 NOTE — Plan of Care (Signed)
  Problem: Pain Managment: Goal: General experience of comfort will improve Outcome: Progressing   

## 2020-01-19 NOTE — Evaluation (Signed)
Physical Therapy Evaluation Patient Details Name: CLAUDETTA SALLIE MRN: [id number] DOB: 12-21-1935 Today's Date: 01/19/2020   History of Present Illness  84 y.o. female presenting with confusion, hypoxia with O2 sat 70% on RA, and bradycardia with EKG (+) third-degree AV block. PMHx significant for HTN, HLD, DM w/ neuropathy, chronic hypoxemic respiratory failure 2/2 underlying COPD on 2.5-3L O2 at baseline, obesity, and anxiety.   Clinical Impression   Patient received in bed, A&Ox2 and very verbose, perseverating on random items and pieces of conversation and very difficult to redirect today. See below for mobility levels/assistance. Generally needs assist of 2 persons for safe transfers with RW due to gross weakness and overall fatigue; did not attempt gait today due to fatigue and elevated HR with activity (up to 115bpm at most). Very poor insight into safety and current deficits. Left in bed with all needs met, bed alarm active and RN aware of patient status. Really needs SNF and 24/7A for ongoing rehab, however very likely to refuse- in case of refusal would need HHPT and 24/7A at home.     Follow Up Recommendations SNF;Supervision/Assistance - 24 hour    Equipment Recommendations  None recommended by PT    Recommendations for Other Services       Precautions / Restrictions Precautions Precautions: Fall;ICD/Pacemaker Precaution Comments: s/p pacemaker 2 weeks ago Restrictions Weight Bearing Restrictions: No      Mobility  Bed Mobility Overal bed mobility: Needs Assistance Bed Mobility: Supine to Sit;Sit to Supine     Supine to sit: Max assist Sit to supine: Mod assist   General bed mobility comments: MaxA to pivot hips around at to EOB; for back to bed, assist to advance BLE up onto bed, min guard for safety with descent of trunk 2/2 fatigue and "head spinning"  Transfers Overall transfer level: Needs assistance Equipment used: Rolling walker (2 wheeled) Transfers: Sit  to/from Stand Sit to Stand: Max assist;+2 safety/equipment;+2 physical assistance         General transfer comment: stood 2x from EOB utilizing rw. max A +2 for safety, right lean to come into standing. On initial stand min A needed on left side, max A needed on right side. A bit less assistance needed on second stand.   Ambulation/Gait             General Gait Details: deferred gait due to fatigue and elevated HR but able to take sidesteps up EOB with MinAx2 for safety/equipment  Stairs            Wheelchair Mobility    Modified Rankin (Stroke Patients Only)       Balance Overall balance assessment: Needs assistance Sitting-balance support: Feet supported;Bilateral upper extremity supported Sitting balance-Leahy Scale: Fair     Standing balance support: Bilateral upper extremity supported;During functional activity Standing balance-Leahy Scale: Poor Standing balance comment: relies on UE support                             Pertinent Vitals/Pain Pain Assessment: Faces Faces Pain Scale: Hurts a little bit Pain Location: unspecified Pain Descriptors / Indicators: Aching Pain Intervention(s): Monitored during session    Home Living Family/patient expects to be discharged to:: Private residence Living Arrangements: Alone Available Help at Discharge: Friend(s);Available PRN/intermittently Type of Home: Apartment Home Access: Stairs to enter Entrance Stairs-Rails: Can reach both Entrance Stairs-Number of Steps: 2 Home Layout: One level Home Equipment: Walker - 2 wheels;Walker - 4 wheels;Bedside  commode;Shower seat Additional Comments: on home O2    Prior Function Level of Independence: Independent with assistive device(s)         Comments: Patient questionable historian. Endorses h/o fall(s?) at home. Reports independence to Mod I at baseline with use of DME.      Hand Dominance   Dominant Hand: Right    Extremity/Trunk Assessment    Upper Extremity Assessment Upper Extremity Assessment: Defer to OT evaluation    Lower Extremity Assessment Lower Extremity Assessment: Generalized weakness    Cervical / Trunk Assessment Cervical / Trunk Assessment: Kyphotic  Communication      Cognition Arousal/Alertness: Awake/alert Behavior During Therapy: Flat affect;Anxious Overall Cognitive Status: No family/caregiver present to determine baseline cognitive functioning Area of Impairment: Orientation;Attention;Memory;Following commands;Safety/judgement;Awareness;Problem solving                 Orientation Level: Disoriented to;Time;Situation Current Attention Level: Sustained Memory: Decreased short-term memory;Decreased recall of precautions Following Commands: Follows one step commands consistently;Follows multi-step commands inconsistently Safety/Judgement: Decreased awareness of safety;Decreased awareness of deficits Awareness: Intellectual Problem Solving: Slow processing;Difficulty sequencing;Requires verbal cues;Requires tactile cues General Comments: very verbose and perseverating on her cat and talking to shiela      General Comments      Exercises     Assessment/Plan    PT Assessment Patient needs continued PT services  PT Problem List Decreased activity tolerance;Decreased balance;Decreased mobility;Decreased knowledge of use of DME;Decreased safety awareness;Decreased knowledge of precautions       PT Treatment Interventions DME instruction;Gait training;Functional mobility training;Therapeutic activities;Therapeutic exercise;Balance training;Patient/family education    PT Goals (Current goals can be found in the Care Plan section)  Acute Rehab PT Goals Patient Stated Goal: home PT Goal Formulation: With patient Time For Goal Achievement: 02/02/20 Potential to Achieve Goals: Fair    Frequency Min 3X/week   Barriers to discharge Decreased caregiver support      Co-evaluation   Reason  for Co-Treatment: For patient/therapist safety;Necessary to address cognition/behavior during functional activity;To address functional/ADL transfers   OT goals addressed during session: ADL's and self-care       AM-PAC PT "6 Clicks" Mobility  Outcome Measure Help needed turning from your back to your side while in a flat bed without using bedrails?: A Lot Help needed moving from lying on your back to sitting on the side of a flat bed without using bedrails?: A Lot Help needed moving to and from a bed to a chair (including a wheelchair)?: A Lot Help needed standing up from a chair using your arms (e.g., wheelchair or bedside chair)?: A Lot Help needed to walk in hospital room?: A Lot Help needed climbing 3-5 steps with a railing? : Total 6 Click Score: 11    End of Session Equipment Utilized During Treatment: Gait belt;Oxygen Activity Tolerance: Patient limited by fatigue Patient left: in bed;with call bell/phone within reach;with bed alarm set Nurse Communication: Mobility status PT Visit Diagnosis: Unsteadiness on feet (R26.81);Muscle weakness (generalized) (M62.81);Difficulty in walking, not elsewhere classified (R26.2);History of falling (Z91.81)    Time: 2863-8177 PT Time Calculation (min) (ACUTE ONLY): 43 min   Charges:   PT Evaluation $PT Eval Moderate Complexity: 1 Mod PT Treatments $Therapeutic Activity: 8-22 mins        Windell Norfolk, DPT, PN1   Supplemental Physical Therapist [place]    Pager [phone] Acute Rehab Office [phone]

## 2020-01-19 NOTE — Progress Notes (Signed)
NEW ADMISSION NOTE New Admission Note:   Arrival Method: Stretcher Mental Orientation: Alert to person, place, time, not entirely to situation Telemetry: box 11 Assessment: Completed Skin: stage 2 Sacral pressure wound, echymosis to right arm elbow IV: Right ac 20 g Pain: no pain Tubes: Nasal Canula, purwick Safety Measures: Safety Fall Prevention Plan has been given, discussed and signed Admission: Completed [facility] Orientation: Patient has been orientated to the room, unit and staff.  Family:  Orders have been reviewed and implemented. Will continue to monitor the patient. Call light has been placed within reach and bed alarm has been activated.   Berneta Levins, RN

## 2020-01-19 NOTE — ED Notes (Signed)
Dr. Leonette Monarch informed IV infiltrated prior to CTA. RN removed IV placed via ultrasound. [place] and IV at bedside for EDP

## 2020-01-19 NOTE — Plan of Care (Signed)
  Problem: Safety: Goal: Ability to remain free from injury will improve Outcome: Progressing   

## 2020-01-19 NOTE — ED Provider Notes (Signed)
[facility] EMERGENCY DEPARTMENT Provider Note  CSN: [id number] Arrival date & time: 01/18/20 1631  Chief Complaint(s) Failure To Thrive  ED Triage Notes [name] Rink, RN (Registered Nurse)   Emergency Medicine   Date of Service: 01/18/2020 4:56 PM   Addendum   Pt to triage via [facility] from home.  Neighbor heard pt yelling and [employer] went for welfare check.  Pt discharged from hospital on Friday and states she has been sitting in chair since then unable to get up.  Oxygen tank empty.  Sats 85% initially.  Pt placed on 2 liters [place] by [facility].  Reports generalized weakness.  Incontinent on arrival.  Pt cleaned at triage.      HPI [name] is a 84 y.o. female for shortness of breath found to be hypoxic on room air due to have an empty oxygen tank at home.  Patient did admit to being short of breath but does not know exactly why she is here.  She reports that she has a caregiver that comes by 2-3 times a week and was supposed to be there today but did not show.  She believes that the caregiver called [employer] to check on her.  Unfortunately, patient is a poor historian and does not answer question directly. She is oriented to person, place, and time.   Patient did deny chest pain today. No recent N/V. No abd pain. No urinary symptoms. Reports taking her home meds as Rx'd and hydrating at home. She was able to provide some details regarding her recent admission.  On record review, patient was admitted to the hospital for complete heart block requiring pacemaker.  Patient was also noted to have a urinary tract infection, and antibiotic course was completed in the hospital.  She was also noted to be encephalopathic thought to be related to poor perfusion from complete heart block, which reportedly resolved.   HPI  Past Medical History Past Medical History:  Diagnosis Date   Allergic rhinitis, cause unspecified    Sinus CT Rec 12-23-2009   Arthritis    Benign paroxysmal  positional vertigo 08/09/2012   Chronic airway obstruction, not elsewhere classified    HFA 75-90% after coaching 08-14-1446   Complication of anesthesia    takes along time to wake up   Diabetes mellitus    Diarrhea    Diverticulosis    Esophageal reflux    Esophageal stricture    GERD (gastroesophageal reflux disease)    Glaucoma    Hiatal hernia    Hypertension    Irritable bowel syndrome    Neuropathy in diabetes ([place]) 08/09/2012   Other chronic nonalcoholic liver disease    Oxygen deficiency    Sciatica    Scoliosis    Shingles 2010   Shortness of breath    Steatohepatitis    Patient Active Problem List   Diagnosis Date Noted   Pressure injury of skin 01/14/2020   Complete heart block (HCC)    Acute metabolic encephalopathy    Acute on chronic respiratory failure with hypoxia (HCC)    Diabetes ([place])    Elevated troponin    Palpitation 01/12/2020   Scoliosis due to degenerative disease of spine in adult patient 09/10/2018   Radicular pain of left lower extremity 09/10/2018   Diarrhea 11/08/2016   Thrombocytopenia ([place]) 11/08/2016   Degenerative lumbar spinal stenosis 10/27/2015   Arthralgia 09/22/2015   Chronic respiratory failure with hypoxia ([place]) 01/11/2015   Diabetic polyneuropathy associated with type 2  diabetes mellitus ([place]) 11/16/2014   Hypertension 05/19/2013   Vertigo 05/11/2013   Dizziness 05/11/2013   Tachycardia 05/11/2013   Lower urinary tract infectious disease 05/11/2013   Sinusitis 05/11/2013   Other and unspecified hyperlipidemia 02/15/2013   Type II diabetes mellitus, uncontrolled ([place]) 01/01/2013   Benign paroxysmal positional vertigo 08/09/2012   Type II or unspecified type diabetes mellitus with neurological manifestations, uncontrolled(250.62) 08/09/2012   GLAUCOMA 03/16/2010   HOARSENESS 03/16/2010   COUGH 12/23/2009   CHEST PAIN 12/23/2009   MUSCULOSKELETAL PAIN 01/28/2009   COPD mixed  type ([place]) 01/15/2009   Seasonal and perennial allergic rhinitis 11/23/2007   CHRONIC RHINITIS 07/16/2007   G E R D 05/29/2007   I B S-DIARRHEAL PREDOMINATE 05/29/2007   FATTY LIVER DISEASE 05/29/2007   Home Medication(s) Prior to Admission medications   Medication Sig Start Date End Date Taking? Authorizing Provider  ALPRAZolam (XANAX) 0.5 MG tablet TAKE 1/2 TO 1 TABLET TWICE DAILY AS NEEDED FOR ANXIETY. Patient taking differently: Take 0.25 mg by mouth as needed for anxiety or sleep.  09/02/19  Yes [name], MD  aspirin 325 MG tablet Take 325 mg by mouth daily.     Yes [provider]  Budeson-Glycopyrrol-Formoterol (BREZTRI AEROSPHERE) 160-9-4.8 MCG/ACT AERO Inhale 2 puffs into the lungs 2 (two) times daily. 12/26/19  Yes Young, Tarri Fuller D, MD  insulin aspart (NOVOLOG FLEXPEN) 100 UNIT/ML FlexPen Inject 12-14 units under the skin at breakfast 14-16 at lunch, and 12-16 at dinner. 10/03/19  Yes [name], MD  insulin degludec (TRESIBA FLEXTOUCH) 200 UNIT/ML FlexTouch Pen Inject 58 Units into the skin daily. Patient taking differently: Inject 56 Units into the skin daily.  10/03/19  Yes [name], MD  ipratropium-albuterol (DUONEB) 0.5-2.5 (3) MG/3ML SOLN 1 vial in neb every 8 hours and as needed Patient taking differently: Take 3 mLs by nebulization every 8 (eight) hours as needed (shortness of breath).  03/01/18  Yes Young, Tarri Fuller D, MD  irbesartan (AVAPRO) 150 MG tablet Take 1 tablet (150 mg total) by mouth daily. 11/05/19  Yes [name], MD  levalbuterol [facility] HFA) 45 MCG/ACT inhaler Inhale 1-2 puffs into the lungs every 6 (six) hours as needed for wheezing. 12/30/19  Yes Baird Lyons D, MD  magic mouthwash SOLN Swish with 2 teaspoonfuls by mouth as needed 12/03/19  Yes [name], MD  omeprazole (PRILOSEC) 20 MG capsule TAKE (1) CAPSULE DAILY. Patient taking differently: Take 20 mg by mouth daily.  12/10/19  Yes [name], MD  OXYGEN Inhale 3 L into the lungs  continuous.    Yes [provider]  pravastatin (PRAVACHOL) 20 MG tablet Take 1 tablet (20 mg total) by mouth daily. 01/05/20  Yes [name], MD  sodium chloride HYPERTONIC 3 % nebulizer solution 1 ampule neb every 6 hours if needed to clear mucus 08/14/17  Yes Young, Clinton D, MD  STIOLTO RESPIMAT 2.5-2.5 MCG/ACT AERS USE 2 PUFFS DAILY Patient taking differently: Inhale 2 puffs into the lungs daily.  11/05/19  Yes Young, Kasandra Knudsen, MD  ACCU-CHEK SOFTCLIX LANCETS lancets 1 each by Other route daily. Use as instructed to check blood sugar 8 times daily. 05/09/18   [name], MD  B-D UF III MINI PEN NEEDLES 31G X 5 MM MISC USE 5 PER DAY TO INJECT INSULIN 12/07/17   [name], MD  glucose blood (ACCU-CHEK AVIVA PLUS) test strip Use as instructed to check blood sugar 4 times a day. DX:E11.65 08/14/19   [name], MD  Insulin Pen Needle (B-D ULTRAFINE III SHORT PEN) 31G X 8 MM MISC TEST 3 TIMES A DAY 12/05/17   [name], MD  Insulin Syringe-Needle U-100 (BD INSULIN SYRINGE ULTRAFINE) 31G X 15/64" 0.5 ML MISC Use 2 per day dx code E11.65 07/21/15   [name], MD  Lancets Misc. (ACCU-CHEK SOFTCLIX LANCET DEV) KIT 1 each by Does not apply route daily. Use as instructed to check blood sugar 8 times daily. 05/09/18   [name], MD  metoprolol succinate (TOPROL-XL) 50 MG 24 hr tablet Take 1 tablet (50 mg total) by mouth daily. Take with or immediately following a meal. 01/17/20   Shawna Clamp, MD  Spacer/Aero-Holding Josiah Lobo (AEROCHAMBER MV) inhaler Use as instructed 08/28/17   Deneise Lever, MD                                                                                                                                    Past Surgical History Past Surgical History:  Procedure Laterality Date   APPENDECTOMY     CARPAL TUNNEL RELEASE     right hand   CHOLECYSTECTOMY OPEN  1978   COLONOSCOPY  07-2001   mild diverticulosis   ESOPHAGOGASTRODUODENOSCOPY  9381,82-99   H  Hernia,es.stricture s/p dil 23F   INTRAOCULAR LENS INSERTION Bilateral    LIVER BIOPSY  [phone]   PACEMAKER IMPLANT N/A 01/14/2020   Procedure: PACEMAKER IMPLANT;  Surgeon: Evans Lance, MD;  Location: [place] [facility];  Service: Cardiovascular;  Laterality: N/A;   PARATHYROID EXPLORATION     TONSILLECTOMY AND ADENOIDECTOMY     TOTAL ABDOMINAL HYSTERECTOMY     ULNAR NERVE TRANSPOSITION  12/07/2011   Procedure: ULNAR NERVE DECOMPRESSION/TRANSPOSITION;this was cancelled-not done  Surgeon: Cammie Sickle., MD;  Location: [place];  Service: Orthopedics;  Laterality: Right;  right ulnar nerve in situ decompression   ULNAR TUNNEL RELEASE  03/07/2012   Procedure: CUBITAL TUNNEL RELEASE;  Surgeon: Roseanne Kaufman, MD;  Location: [place];  Service: Orthopedics;  Laterality: Right;  ulnar nerve release at the elbow     Family History Family History  Problem Relation Age of Onset   Heart disease Father    Lung cancer Mother        small cell;Byssinosis   Lung cancer Sister    Liver cancer Sister        ? mets from another area of the body   Diabetes Other        grandmother   Stroke Maternal Grandfather     Social History Social History   Tobacco Use   Smoking status: Former Smoker    Types: Cigarettes    Quit date: 05/08/1988    Years since quitting: 31.7   Smokeless tobacco: Never Used  Scientific laboratory technician Use: Never used  Substance Use Topics   Alcohol use: No    Alcohol/week: 0.0 standard drinks  Drug use: No   Allergies Chlordiazepoxide-clidinium, Biaxin [clarithromycin], Tramadol, and Codeine  Review of Systems Review of Systems All other systems are reviewed and are negative for acute change except as noted in the HPI  Physical Exam Vital Signs  I have reviewed the triage vital signs BP 116/89    Pulse (!) 59    Temp 98.8 F (37.1 C) (Oral)    Resp (!) 32    Ht 5' 3"  (1.6 m)    Wt 77.1 kg    SpO2 95%    BMI  30.11 kg/m   Physical Exam Vitals reviewed.  Constitutional:      General: She is not in acute distress.    Appearance: She is well-developed. She is not diaphoretic.  HENT:     Head: Normocephalic and atraumatic.     Nose: Nose normal.  Eyes:     General: No scleral icterus.       Right eye: No discharge.        Left eye: No discharge.     Conjunctiva/sclera: Conjunctivae normal.     Pupils: Pupils are equal, round, and reactive to light.  Cardiovascular:     Rate and Rhythm: Tachycardia present. Rhythm irregularly irregular.     Heart sounds: No murmur heard.  No friction rub. No gallop.   Pulmonary:     Effort: Pulmonary effort is normal. No respiratory distress.     Breath sounds: Normal breath sounds. No stridor. No rales.  Chest:    Abdominal:     General: There is no distension.     Palpations: Abdomen is soft.     Tenderness: There is no abdominal tenderness.  Musculoskeletal:        General: No tenderness.     Cervical back: Normal range of motion and neck supple.  Skin:    General: Skin is warm and dry.     Findings: No erythema or rash.  Neurological:     Mental Status: She is alert and oriented to person, place, and time.     ED Results and Treatments Labs (all labs ordered are listed, but only abnormal results are displayed) Labs Reviewed  BASIC METABOLIC PANEL - Abnormal; Notable for the following components:      Result Value   Chloride 94 (*)    Glucose, Bld 250 (*)    All other components within normal limits  CBC - Abnormal; Notable for the following components:   WBC 11.9 (*)    RBC 5.31 (*)    HCT 46.1 (*)    All other components within normal limits  URINALYSIS, ROUTINE W REFLEX MICROSCOPIC - Abnormal; Notable for the following components:   APPearance HAZY (*)    Glucose, UA >=500 (*)    Hgb urine dipstick SMALL (*)    Ketones, ur 80 (*)    Protein, ur 30 (*)    Leukocytes,Ua LARGE (*)    Bacteria, UA FEW (*)    All other  components within normal limits  D-DIMER, QUANTITATIVE (NOT AT [facility]) - Abnormal; Notable for the following components:   D-Dimer, Quant 3.71 (*)    All other components within normal limits  CBG MONITORING, ED - Abnormal; Notable for the following components:   Glucose-Capillary 256 (*)    All other components within normal limits  TROPONIN I (HIGH SENSITIVITY) - Abnormal; Notable for the following components:   Troponin I (High Sensitivity) 68 (*)    All other components within normal limits  TROPONIN  I (HIGH SENSITIVITY) - Abnormal; Notable for the following components:   Troponin I (High Sensitivity) 71 (*)    All other components within normal limits                                                                                                                         EKG  EKG Interpretation  Date/Time:  Sunday January 18 2020 23:41:44 EDT Ventricular Rate:  123 PR Interval:    QRS Duration: 147 QT Interval:  394 QTC Calculation: 564 R Axis:   -83 Text Interpretation: V-paced complexes No further rhythm analysis attempted due to paced rhythm LVH with IVCD, LAD and secondary repol abnrm Inferior infarct, acute (RCA) Anterolateral infarct, old Prolonged QT interval Probable RV involvement, suggest recording right precordial leads Reconfirmed by Addison Lank [phone]) on 01/19/2020 12:52:07 AM      Radiology DG Chest 2 View  Result Date: 01/19/2020 CLINICAL DATA:  Generalized weakness. EXAM: CHEST - 2 VIEW COMPARISON:  January 15, 2020 FINDINGS: A dual lead AICD is noted. There is no evidence of acute infiltrate, pleural effusion or pneumothorax. The cardiac silhouette is mildly enlarged and unchanged in size. Marked severity calcification of the aortic arch is noted. The visualized skeletal structures are unremarkable. IMPRESSION: No active cardiopulmonary disease. Electronically Signed   By: Virgina Norfolk M.D.   On: 01/19/2020 01:15   CT Angio Chest PE W and/or Wo  Contrast  Result Date: 01/19/2020 CLINICAL DATA:  Low to intermediate probability for pulmonary embolism. Positive D-dimer. EXAM: CT ANGIOGRAPHY CHEST WITH CONTRAST TECHNIQUE: Multidetector CT imaging of the chest was performed using the standard protocol during bolus administration of intravenous contrast. Multiplanar CT image reconstructions and MIPs were obtained to evaluate the vascular anatomy. CONTRAST:  6m OMNIPAQUE IOHEXOL 350 MG/ML SOLN COMPARISON:  None. FINDINGS: Cardiovascular: Normal heart size. No pericardial effusion. There is aortic and coronary atherosclerosis. No pulmonary artery filling defect Mediastinum/Nodes: Negative for adenopathy or mass. Lungs/Pleura: The central airways are clear. Right posterior costophrenic sulcus opacity with volume loss attributed atelectasis. Minimal apical emphysema. Upper Abdomen: Lobulated liver raising the possibility of cirrhosis, also seen described on a 2018 CT. Cholecystectomy. No acute finding. Musculoskeletal: No acute or aggressive finding. Review of the MIP images confirms the above findings. IMPRESSION: 1. No acute finding, including pulmonary embolism. 2. Mild dependent atelectasis. 3. Aortic Atherosclerosis (ICD10-I70.0). Electronically Signed   By: JMonte FantasiaM.D.   On: 01/19/2020 05:01    Pertinent labs & imaging results that were available during my care of the patient were reviewed by me and considered in my medical decision making (see chart for details).  Medications Ordered in ED Medications  sodium chloride flush (NS) 0.9 % injection 3 mL (3 mLs Intravenous Not Given 01/18/20 2327)  sodium chloride 0.9 % bolus 500 mL (0 mLs Intravenous Stopped 01/19/20 0438)    Followed by  0.9 %  sodium chloride infusion (has no administration in time range)  metoprolol succinate (TOPROL-XL) 24  hr tablet 50 mg (has no administration in time range)  metoprolol tartrate (LOPRESSOR) injection 5 mg (5 mg Intravenous Given 01/19/20 0428)   cefTRIAXone (ROCEPHIN) 1 g in sodium chloride 0.9 % 100 mL IVPB (0 g Intravenous Stopped 01/19/20 0502)  iohexol (OMNIPAQUE) 350 MG/ML injection 80 mL (80 mLs Intravenous Contrast Given 01/19/20 0436)                                                                                                                                    Procedures .1-3 Lead EKG Interpretation Performed by: Fatima Blank, MD Authorized by: Fatima Blank, MD     Interpretation: abnormal     ECG rate:  125   ECG rate assessment: tachycardic     Rhythm: paced     Ectopy: none     Abnormal conduction: 3rd degree AV block   Ultrasound ED Peripheral IV (Provider)  Date/Time: 01/19/2020 12:53 AM Performed by: Fatima Blank, MD Authorized by: Fatima Blank, MD   Procedure details:    Indications: multiple failed IV attempts     Skin Prep: chlorhexidine gluconate     Location:  Left AC   Angiocath:  20 G   Bedside Ultrasound Guided: Yes     Images: archived     Patient tolerated procedure without complications: Yes     Dressing applied: Yes   Ultrasound ED Echo  Date/Time: 01/19/2020 12:54 AM Performed by: Fatima Blank, MD Authorized by: Fatima Blank, MD   Procedure details:    Indications: dyspnea     Views: parasternal long axis view, parasternal short axis view and apical 4 chamber view     Images: archived     Limitations:  Acoustic shadowing, positioning and body habitus Findings:    Pericardium: no pericardial effusion     LV Function: normal (>50% EF)     RV Diameter: normal   Impression:    Impression: normal      (including critical care time)  Medical Decision Making / ED Course I have reviewed the nursing notes for this encounter and the patient's prior records (if available in EHR or on provided paperwork).   [name] was evaluated in Emergency Department on 01/19/2020 for the symptoms described in the history of present  illness. She was evaluated in the context of the global COVID-19 pandemic, which necessitated consideration that the patient might be at risk for infection with the SARS-CoV-2 virus that causes COVID-19. Institutional protocols and algorithms that pertain to the evaluation of patients at risk for COVID-19 are in a state of rapid change based on information released by regulatory bodies including the CDC and federal and state organizations. These policies and algorithms were followed during the patient's care in the ED.    Clinical Course as of Jan 19 631  Mon Jan 19, 2020  0005 Patient presents for shortness of breath.  Hypoxic  with sats in the 80s on room air, with an empty oxygen tank at home.  Currently is satting well on 2 L nasal cannula but reports still feeling short of breath.  Lungs okay to auscultation bilaterally.  There is no evidence of volume overload on exam. Bedside ultrasound of the heart was performed which showed good contractility, nondistended RV, and no evidence of pericardial effusion.   EKG was noted for intermittent irregular rhythm with intermittent capture as of the pacemaker.   Labs drawn in triage notable for mild leukocytosis with a stable hemoglobin.  No significant electrolyte derangements or renal sufficiency.  Patient does have mild hyperglycemia without evidence of DKA.  Will add on dimer and troponin.  We will also obtain a chest x-ray.  Patient does have dry mucus membranes. Will provide oral hydration and small IVF bolus.     [PC]  7711 Dimer significantly elevated. Will get CTA to rule PE.   CXR w/o evidence of pneumothorax, pulmonary edema or pneumonia.   [PC]  6579 UA with evidence of UTI. Recent cultures grew out K.pneumo susceptible to rocephin. Will give dose now.   [PC]  0330 Patient remained tachycardic in the 120s. Will give metop.    [PC]  0430 Patient appears to be sundowning as well. Will discuss admission with hospitalist given her  recurrent UTI once CT results.    [PC]  0383 HR improved to 90-110s. Will give home toprol and will discuss with cardiology.   [PC]  [phone] CT negative for PE or pneumonia   [PC]    Clinical Course User Index [PC] Lagretta Loseke, Grayce Sessions, MD     Final Clinical Impression(s) / ED Diagnoses Final diagnoses:  SOB (shortness of breath)  Acute cystitis with hematuria      This chart was dictated using voice recognition software.  Despite best efforts to proofread,  errors can occur which can change the documentation meaning.   Fatima Blank, MD 01/19/20 [phone]

## 2020-01-19 NOTE — Evaluation (Signed)
Occupational Therapy Evaluation Patient Details Name: [name] MRN: [id number] DOB: 1936-01-14 Today's Date: 01/19/2020    History of Present Illness 84 y.o. female presenting with confusion, hypoxia with O2 sat 70% on RA, and bradycardia with EKG (+) third-degree AV block. PMHx significant for HTN, HLD, DM w/ neuropathy, chronic hypoxemic respiratory failure 2/2 underlying COPD on 2.5-3L O2 at baseline, obesity, and anxiety.    Clinical Impression   Pt admitted with the above diagnoses and presents with below problem list. Pt will benefit from continued acute OT to address the below listed deficits and maximize independence with basic ADLs prior to d/c to venue below. At baseline, pt reports, and per chart review, she was mod I (walker) with basic ADLs and has a hired Engineer, production, Freda Munro, who helps her out a few hours a week. Pt is currently +2 physical assist, +2 safety with OOB transfers and LB ADLs, mod A to side step along EOB. Pt currently needs more assist than what is currently available to her at home. This is complicated by the fact that she is adamant about not going to SNF. I do question her capacity for making d/c planning decisions as she presents with significantly impaired cognition (see below).      Follow Up Recommendations  SNF    Equipment Recommendations  Other (comment) (defer to next venue)    Recommendations for Other Services       Precautions / Restrictions Precautions Precautions: Fall      Mobility Bed Mobility Overal bed mobility: Needs Assistance Bed Mobility: Sit to Supine       Sit to supine: Mod assist   General bed mobility comments: assist to advance BLE up onto bed, min guard for safety with descent of trunk 2/2 fatigue and "head spinning"  Transfers Overall transfer level: Needs assistance Equipment used: Rolling walker (2 wheeled) Transfers: Sit to/from Stand Sit to Stand: Max assist;+2 safety/equipment;+2 physical assistance          General transfer comment: stood 2x from EOB utilizing rw. max A +2 for safety, right lean to come into standing. On initial stand min A needed on left side, max A needed on right side. A bit less assistance needed on second stand.     Balance Overall balance assessment: Needs assistance Sitting-balance support: Feet supported;Bilateral upper extremity supported Sitting balance-Leahy Scale: Fair     Standing balance support: Bilateral upper extremity supported;During functional activity Standing balance-Leahy Scale: Poor Standing balance comment: relies on UE support                           ADL either performed or assessed with clinical judgement   ADL Overall ADL's : Needs assistance/impaired Eating/Feeding: Set up;Sitting   Grooming: Maximal assistance;Sitting   Upper Body Bathing: Maximal assistance;Sitting   Lower Body Bathing: Sit to/from stand;+2 for safety/equipment;Maximal assistance   Upper Body Dressing : Sitting;Moderate assistance   Lower Body Dressing: Maximal assistance;Sitting/lateral leans;Sit to/from stand;+2 for safety/equipment   Toilet Transfer: Maximal assistance;+2 for physical assistance;Moderate assistance;Stand-pivot;RW;BSC;+2 for safety/equipment Toilet Transfer Details (indicate cue type and reason): clinical judgement Toileting- Clothing Manipulation and Hygiene: Maximal assistance;+2 for safety/equipment         General ADL Comments: Pt very verbose during session, anxious. Completed bed mobility, sat EOB, stood 2x with sidestepps completed each time. Pt expressed initial fear of mobilizing due to h/o fall at home     Vision Baseline Vision/History: Wears glasses Wears Glasses:  Reading only       Perception     Praxis      Pertinent Vitals/Pain Pain Assessment: Faces Faces Pain Scale: Hurts a little bit Pain Location: unspecified Pain Descriptors / Indicators: Aching Pain Intervention(s): Monitored during session      Hand Dominance Right   Extremity/Trunk Assessment Upper Extremity Assessment Upper Extremity Assessment: Generalized weakness   Lower Extremity Assessment Lower Extremity Assessment: Defer to PT evaluation   Cervical / Trunk Assessment Cervical / Trunk Assessment: Kyphotic   Communication     Cognition Arousal/Alertness: Awake/alert Behavior During Therapy: Flat affect;Anxious Overall Cognitive Status: No family/caregiver present to determine baseline cognitive functioning Area of Impairment: Orientation;Attention;Memory;Following commands;Safety/judgement;Awareness;Problem solving                 Orientation Level: Disoriented to;Time;Situation Current Attention Level: Sustained Memory: Decreased short-term memory;Decreased recall of precautions Following Commands: Follows one step commands consistently;Follows multi-step commands inconsistently Safety/Judgement: Decreased awareness of safety;Decreased awareness of deficits Awareness: Intellectual Problem Solving: Slow processing;Difficulty sequencing;Requires verbal cues;Requires tactile cues     General Comments       Exercises     Shoulder Instructions      Home Living Family/patient expects to be discharged to:: Private residence Living Arrangements: Alone Available Help at Discharge: Friend(s);Available PRN/intermittently Type of Home: Apartment Home Access: Stairs to enter Entrance Stairs-Number of Steps: 2 Entrance Stairs-Rails: Can reach both Home Layout: One level     Bathroom Shower/Tub: Tub/shower unit         Home Equipment: Environmental consultant - 2 wheels;Walker - 4 wheels;Bedside commode;Shower seat   Additional Comments: on home O2      Prior Functioning/Environment Level of Independence: Independent with assistive device(s)        Comments: Patient questionable historian. Endorses h/o fall(s?) at home. Reports independence to Mod I at baseline with use of DME.         OT Problem List:  Decreased strength;Decreased range of motion;Decreased activity tolerance;Impaired balance (sitting and/or standing);Impaired vision/perception;Decreased coordination;Decreased cognition;Decreased safety awareness;Decreased knowledge of use of DME or AE;Pain      OT Treatment/Interventions: Self-care/ADL training;Therapeutic exercise;Energy conservation;DME and/or AE instruction;Therapeutic activities;Cognitive remediation/compensation;Visual/perceptual remediation/compensation;Patient/family education;Balance training    OT Goals(Current goals can be found in the care plan section) Acute Rehab OT Goals Patient Stated Goal: home OT Goal Formulation: With patient Time For Goal Achievement: 02/02/20 Potential to Achieve Goals: Fair ADL Goals Pt Will Perform Upper Body Bathing: with set-up;sitting Pt Will Perform Lower Body Bathing: with min assist;sit to/from stand Pt Will Transfer to Toilet: with mod assist;ambulating;bedside commode Pt Will Perform Toileting - Clothing Manipulation and hygiene: with mod assist;sit to/from stand  OT Frequency: Min 2X/week   Barriers to D/C: Decreased caregiver support  Pt with no family and limited [facility] aide hours (private pay). Pt is adament about not going to SNF for rehab but question her capacity for making this decision. Decreased insight, Intellectual awareness level, decreased safety awareness.        Co-evaluation PT/OT/SLP Co-Evaluation/Treatment: Yes Reason for Co-Treatment: For patient/therapist safety;Necessary to address cognition/behavior during functional activity;To address functional/ADL transfers   OT goals addressed during session: ADL's and self-care      AM-PAC OT "6 Clicks" Daily Activity     Outcome Measure Help from another person eating meals?: A Little Help from another person taking care of personal grooming?: A Lot Help from another person toileting, which includes using toliet, bedpan, or urinal?: A Lot Help from another  person bathing (including washing,  rinsing, drying)?: A Lot Help from another person to put on and taking off regular upper body clothing?: A Lot Help from another person to put on and taking off regular lower body clothing?: A Lot 6 Click Score: 13   End of Session Equipment Utilized During Treatment: Gait belt;Rolling walker;Oxygen  Activity Tolerance: Other (comment) ("head spinning in standing") Patient left: in bed;with call bell/phone within reach;with bed alarm set  OT Visit Diagnosis: Unsteadiness on feet (R26.81);Other abnormalities of gait and mobility (R26.89);Muscle weakness (generalized) (M62.81);Pain                Time: 0813-8871 OT Time Calculation (min): 24 min Charges:  OT General Charges $OT Visit: 1 Visit OT Evaluation $OT Eval Moderate Complexity: [place], OT Acute Rehabilitation Services Pager: [phone] Office: [phone]   Hortencia Pilar 01/19/2020, 2:03 PM

## 2020-01-19 NOTE — ED Notes (Signed)
Patient transported to X-ray 

## 2020-01-19 NOTE — Progress Notes (Deleted)
OT Cancellation Note  Patient Details Name: [name] MRN: [id number] DOB: [birth year]   Cancelled Treatment:     Attempted OT evaluation. Pt currently working with PT. Plan to reattempt.  Tyrone Schimke, OT Acute Rehabilitation Services Pager: [phone] Office: [phone]  01/19/2020, 12:32 PM

## 2020-01-20 ENCOUNTER — Inpatient Hospital Stay (HOSPITAL_COMMUNITY): Payer: Medicare Other

## 2020-01-20 LAB — CBC
HCT: 44.1 % (ref 36.0–46.0)
Hemoglobin: 13.2 g/dL (ref 12.0–15.0)
MCH: 26.3 pg (ref 26.0–34.0)
MCHC: 29.9 g/dL — ABNORMAL LOW (ref 30.0–36.0)
MCV: 87.8 fL (ref 80.0–100.0)
Platelets: 140 10*3/uL — ABNORMAL LOW (ref 150–400)
RBC: 5.02 MIL/uL (ref 3.87–5.11)
RDW: 15.6 % — ABNORMAL HIGH (ref 11.5–15.5)
WBC: 9.1 10*3/uL (ref 4.0–10.5)
nRBC: 0 % (ref 0.0–0.2)

## 2020-01-20 LAB — GLUCOSE, CAPILLARY
Glucose-Capillary: 203 mg/dL — ABNORMAL HIGH (ref 70–99)
Glucose-Capillary: 198 mg/dL — ABNORMAL HIGH (ref 70–99)
Glucose-Capillary: 164 mg/dL — ABNORMAL HIGH (ref 70–99)
Glucose-Capillary: 168 mg/dL — ABNORMAL HIGH (ref 70–99)

## 2020-01-20 LAB — BASIC METABOLIC PANEL
Anion gap: 10 (ref 5–15)
BUN: 8 mg/dL (ref 8–23)
CO2: 30 mmol/L (ref 22–32)
Calcium: 8.9 mg/dL (ref 8.9–10.3)
Chloride: 97 mmol/L — ABNORMAL LOW (ref 98–111)
Creatinine, Ser: 0.61 mg/dL (ref 0.44–1.00)
GFR calc Af Amer: >60 mL/min (ref >60)
GFR calc non Af Amer: >60 mL/min (ref >60)
Glucose, Bld: 199 mg/dL — ABNORMAL HIGH (ref 70–99)
Potassium: 4.3 mmol/L (ref 3.5–5.1)
Sodium: 137 mmol/L (ref 135–145)

## 2020-01-20 LAB — URINE CULTURE

## 2020-01-20 LAB — VITAMIN B12: Vitamin B-12: 255 pg/mL (ref 180–914)

## 2020-01-20 MED ORDER — POLYETHYLENE GLYCOL 3350 17 G PO PACK
17.0000 g | PACK | Freq: Every day | ORAL | Status: DC
  Administered 2020-01-22: 17 g via ORAL
  Filled 2020-01-20 (×3): qty 1

## 2020-01-20 MED ORDER — VITAMIN B-12 100 MCG PO TABS
100.0000 ug | ORAL_TABLET | Freq: Every day | ORAL | Status: DC
  Administered 2020-01-21 – 2020-01-23 (×3): 100 ug via ORAL
  Filled 2020-01-20 (×3): qty 1

## 2020-01-20 MED ORDER — HYDRALAZINE HCL 25 MG PO TABS
25.0000 mg | ORAL_TABLET | Freq: Four times a day (QID) | ORAL | Status: DC | PRN
  Administered 2020-01-21: 25 mg via ORAL
  Filled 2020-01-20: qty 1

## 2020-01-20 MED ORDER — TIOTROPIUM BROMIDE-OLODATEROL 2.5-2.5 MCG/ACT IN AERS
2.0000 | INHALATION_SPRAY | Freq: Every day | RESPIRATORY_TRACT | Status: DC
  Administered 2020-01-20 – 2020-01-23 (×4): 2 via RESPIRATORY_TRACT

## 2020-01-20 MED ORDER — LIP MEDEX EX OINT
TOPICAL_OINTMENT | CUTANEOUS | Status: DC | PRN
  Filled 2020-01-20: qty 7

## 2020-01-20 MED ORDER — SODIUM CHLORIDE 0.9 % IV SOLN
1.0000 g | INTRAVENOUS | Status: DC
  Administered 2020-01-21 – 2020-01-22 (×2): 1 g via INTRAVENOUS
  Filled 2020-01-20: qty 10
  Filled 2020-01-20: qty 1
  Filled 2020-01-20: qty 10

## 2020-01-20 NOTE — TOC Progression Note (Addendum)
Transition of Care [facility]) - Progression Note    Patient Details  Name: [name] MRN: [id number] Date of Birth: [birth year]  Transition of Care [facility]) CM/SW [place], [place] Phone Number: 01/20/2020, 3:47 PM  Clinical Narrative:    CSW spoke with pt's friend (listed on face sheet)  Morey Hummingbird.  Ms. Ernie Hew is also pt's next door neighbor. Ms. Ernie Hew stated "pt does not have any relatives that she is in contact with and has no one. "  Pt may have a half brother in [place] however pt is not in contact with him according to Ms. Moffit.  Pt's friend is willing to sign pt into SNF .  CSW will leave medicare.gov list in pt's room for Ms. Moffit.  CSW will contact APS for guardianship after discussing with supervisor.  TOC Team will continue to assist with disposition.         Expected Discharge Plan and Services                                                 Social Determinants of Health (SDOH) Interventions    Readmission Risk Interventions No flowsheet data found.

## 2020-01-20 NOTE — Progress Notes (Signed)
PROGRESS NOTE    [name]  DQQ:[id number] DOB: 12/06/1935 DOA: 01/18/2020 PCP: Patient, No Pcp Per   Brief Narrative: 84  with past medical history significant for hypertension, diabetes, neuropathy, chronic hypoxic respiratory failure on 2 on 3 L of oxygen at home, COPD, obesity, who presents to the emergency department after she was found hypoxic.  Per report her caregiver called [employer] when performed a  welfare check and found the patient sitting in chair with oxygen saturation 85% on room air, patient oxygen tank were empty. -Of note patient recently hospitalized from 9/7 until 9/10 for acute metabolic encephalopathy thought to be secondary to heart block and hypoxia with signs of Klebsiella UTI for she was treated with 3 days of Rocephin. -At baseline patient lives alone and has a care giver that comes and check on her. -Patient report memory problems.  Evaluation in the ED patient was afebrile, oxygen saturation maintained on 4 L of oxygen.  High sensitive troponin 68/71 D-dimer 3.7.  CT angio chest negative for PE.   Assessment & Plan:   Principal Problem:   Acute metabolic encephalopathy Active Problems:   COPD mixed type (HCC)   Type II diabetes mellitus, uncontrolled ([place])   Hypertension   Acute on chronic respiratory failure with hypoxia (HCC)   Elevated troponin   UTI (urinary tract infection)   Status post placement of cardiac pacemaker   Leukocytosis  1-Acute metabolic Encephalopathy; Patient is alert and oriented this morning.  She does report memory problems. Might be related to hypoxemia or infection. Will benefit of skilled nursing facility, placement.  Psych consulted to assess capacity  ? UTI;  UA with 21-50 WBC Continue with ceftriaxone Follow urine culture  Acute on chronic respiratory failure.  Ran out of oxygen.  Oxygen stable on 3 L.   Mild thrombocytopenia; Follow trend  Elevated D dimer; CT angio Negative.    Elevated troponin; Demand ischemia, due to hypoxemia.   Complete Heart block S/P pacemaker: Pacemaker place 01/14/2020.  Diabetes;Mellitus type 2;  Continue with sliding scale insulin. Continue with Lantus.  HTN; Continue with avapro and metoprolol  Anxiety: With Xanax as needed.  HLD; Continue with pravastatin Obesity: BMI 30 COPD: Continue with inhaler  Constipation; Check KUB Pressure Injury 82/07/21 Sacrum Medial Stage 1 -  Intact skin with non-blanchable redness of a localized area usually over a bony prominence. (Active)  01/13/20 2046  Location: Sacrum  Location Orientation: Medial  Staging: Stage 1 -  Intact skin with non-blanchable redness of a localized area usually over a bony prominence.  Wound Description (Comments):   Present on Admission: Yes     Pressure Injury 01/19/20 Buttocks Stage 2 -  Partial thickness loss of dermis presenting as a shallow open injury with a red, pink wound bed without slough. redness (Active)  01/19/20 1400  Location: Buttocks  Location Orientation:   Staging: Stage 2 -  Partial thickness loss of dermis presenting as a shallow open injury with a red, pink wound bed without slough.  Wound Description (Comments): redness  Present on Admission: Yes                  Estimated body mass index is 31.67 kg/m as calculated from the following:   Height as of this encounter: 5' 3"  (1.6 m).   Weight as of this encounter: 81.1 kg.   DVT prophylaxis: Lovenox Code Status: Full code Family Communication: No family at bedside, patient said that she does not have  any family Disposition Plan:  Status is: Inpatient  Remains inpatient appropriate because:Ongoing diagnostic testing needed not appropriate for outpatient work up   Dispo: The patient is from: Home              Anticipated d/c is to: SNF              Anticipated d/c date is: 2 days              Patient currently is not medically stable to d/c.     Consultants:    Psych  Procedures:   none  Antimicrobials:   Ceftriaxone  Subjective: She is alert, orieneted to person and place. Complaint of constipation.   Objective: Vitals:   01/19/20 1123 01/19/20 1600 01/19/20 1945 01/19/20 2343  BP: (!) 162/77 (!) 151/65 (!) 160/73 (!) 167/71  Pulse: (!) 110 92 89 91  Resp: 18 19 14 18   Temp: 98.8 F (37.1 C) 98.1 F (36.7 C) 98.5 F (36.9 C) 99.3 F (37.4 C)  TempSrc: Oral Oral Oral Oral  SpO2: 95% 96% 95% 95%  Weight:   81.1 kg   Height:        Intake/Output Summary (Last 24 hours) at 01/20/2020 0755 Last data filed at 01/20/2020 0549 Gross per 24 hour  Intake 1881.25 ml  Output 800 ml  Net 1081.25 ml   Filed Weights   01/19/20 0010 01/19/20 1945  Weight: 77.1 kg 81.1 kg    Examination:  General exam: Appears calm and comfortable  Respiratory system: Clear to auscultation. Respiratory effort normal. Cardiovascular system: S1 & S2 heard, RRR. No JVD, murmurs, rubs, gallops or clicks. No pedal edema. Gastrointestinal system: Abdomen is nondistended, soft and nontender. No organomegaly or masses felt. Normal bowel sounds heard. Central nervous system: Alert and oriented Extremities: Symmetric 5 x 5 power.   Data Reviewed: I have personally reviewed following labs and imaging studies  CBC: Recent Labs  Lab 01/13/20 1213 01/13/20 1226 01/13/20 1603 01/14/20 0338 01/15/20 0708 01/18/20 1727 01/20/20 0625  WBC 10.0   < > 10.2 12.2* 10.4 11.9* 9.1  NEUTROABS 8.0*  --   --   --   --   --   --   HGB 12.5   < > 13.6 13.5 12.9 13.9 13.2  HCT 41.0   < > 44.1 42.4 43.0 46.1* 44.1  MCV 86.3   < > 85.0 85.8 87.9 86.8 87.8  PLT 167   < > PLATELET CLUMPS NOTED ON SMEAR, UNABLE TO ESTIMATE 176 162 206 140*   < > = values in this interval not displayed.   Basic Metabolic Panel: Recent Labs  Lab 01/13/20 1213 01/13/20 1213 01/13/20 1226 01/13/20 1603 01/14/20 0338 01/15/20 0708 01/18/20 1727 01/20/20 0625  NA 134*   < > 134*   --  136 139 136 137  K 4.8   < > 4.7  --  4.5 4.3 4.0 4.3  CL 96*  --   --   --  98 100 94* 97*  CO2 28  --   --   --  29 30 28 30   GLUCOSE 323*  --   --   --  225* 172* 250* 199*  BUN 15  --   --   --  24* 21 8 8   CREATININE 0.78   < >  --  0.82 0.91 0.72 0.76 0.61  CALCIUM 9.0  --   --   --  9.0 8.7* 8.9 8.9  MG  --   --   --  1.7  --  2.3  --   --   PHOS  --   --   --  3.4  --  3.5  --   --    < > = values in this interval not displayed.   GFR: Estimated Creatinine Clearance: 54.7 mL/min (by C-G formula based on SCr of 0.61 mg/dL). Liver Function Tests: Recent Labs  Lab 01/13/20 1213 01/14/20 0338  AST 20 23  ALT 15 16  ALKPHOS 50 50  BILITOT 0.8 0.8  PROT 7.1 6.5  ALBUMIN 3.4* 3.1*   No results for input(s): LIPASE, AMYLASE in the last 168 hours. No results for input(s): AMMONIA in the last 168 hours. Coagulation Profile: No results for input(s): INR, PROTIME in the last 168 hours. Cardiac Enzymes: No results for input(s): CKTOTAL, CKMB, CKMBINDEX, TROPONINI in the last 168 hours. BNP (last 3 results) No results for input(s): PROBNP in the last 8760 hours. HbA1C: No results for input(s): HGBA1C in the last 72 hours. CBG: Recent Labs  Lab 01/16/20 1604 01/18/20 2337 01/19/20 1000 01/19/20 1121 01/19/20 1658  GLUCAP 204* 256* 228* 255* 129*   Lipid Profile: No results for input(s): CHOL, HDL, LDLCALC, TRIG, CHOLHDL, LDLDIRECT in the last 72 hours. Thyroid Function Tests: No results for input(s): TSH, T4TOTAL, FREET4, T3FREE, THYROIDAB in the last 72 hours. Anemia Panel: No results for input(s): VITAMINB12, FOLATE, FERRITIN, TIBC, IRON, RETICCTPCT in the last 72 hours. Sepsis Labs: Recent Labs  Lab 01/13/20 1213  LATICACIDVEN 1.9    Recent Results (from the past 240 hour(s))  Urine culture     Status: Abnormal   Collection Time: 01/13/20 12:42 PM   Specimen: Urine, Catheterized  Result Value Ref Range Status   Specimen Description URINE, CATHETERIZED   Final   Special Requests NONE  Final   Culture (A)  Final    >=100,000 COLONIES/mL KLEBSIELLA PNEUMONIAE >=100,000 COLONIES/mL LACTOBACILLUS SPECIES Standardized susceptibility testing for this organism is not available. Performed at [facility], [place] [address], [place], [place] [address]    Report Status 01/16/2020 FINAL  Final   Organism ID, Bacteria KLEBSIELLA PNEUMONIAE (A)  Final      Susceptibility   Klebsiella pneumoniae - MIC*    AMPICILLIN >=32 RESISTANT Resistant     CEFAZOLIN <=4 SENSITIVE Sensitive     CEFTRIAXONE <=0.25 SENSITIVE Sensitive     CIPROFLOXACIN <=0.25 SENSITIVE Sensitive     GENTAMICIN <=1 SENSITIVE Sensitive     IMIPENEM <=0.25 SENSITIVE Sensitive     NITROFURANTOIN 128 RESISTANT Resistant     TRIMETH/SULFA <=20 SENSITIVE Sensitive     AMPICILLIN/SULBACTAM 8 SENSITIVE Sensitive     * >=100,000 COLONIES/mL KLEBSIELLA PNEUMONIAE  SARS Coronavirus 2 by RT PCR (hospital order, performed in [place] hospital lab) Nasopharyngeal Nasopharyngeal Swab     Status: None   Collection Time: 01/13/20  1:22 PM   Specimen: Nasopharyngeal Swab  Result Value Ref Range Status   SARS Coronavirus 2 NEGATIVE NEGATIVE Final    Comment: (NOTE) SARS-CoV-2 target nucleic acids are NOT DETECTED.  The SARS-CoV-2 RNA is generally detectable in upper and lower respiratory specimens during the acute phase of infection. The lowest concentration of SARS-CoV-2 viral copies this assay can detect is 250 copies / mL. A negative result does not preclude SARS-CoV-2 infection and should not be used as the sole basis for treatment or other patient management decisions.  A negative result may occur with improper specimen collection / handling, submission of specimen other than  nasopharyngeal swab, presence of viral mutation(s) within the areas targeted by this assay, and inadequate number of viral copies (<250 copies / mL). A negative result must be combined with  clinical observations, patient history, and epidemiological information.  Fact Sheet for Patients:   StrictlyIdeas.no  Fact Sheet for Healthcare Providers: BankingDealers.co.za  This test is not yet approved or  cleared by the [place] FDA and has been authorized for detection and/or diagnosis of SARS-CoV-2 by FDA under an Emergency Use Authorization (EUA).  This EUA will remain in effect (meaning this test can be used) for the duration of the COVID-19 declaration under Section 564(b)(1) of the Act, 21 U.S.C. section 360bbb-3(b)(1), unless the authorization is terminated or revoked sooner.  Performed at [facility], [place] [address] [place], [place] [address]   Culture, blood (routine x 2)     Status: None   Collection Time: 01/13/20  4:03 PM   Specimen: BLOOD  Result Value Ref Range Status   Specimen Description BLOOD SITE NOT SPECIFIED  Final   Special Requests   Final    BOTTLES DRAWN AEROBIC AND ANAEROBIC Blood Culture results may not be optimal due to an inadequate volume of blood received in culture bottles   Culture   Final    NO GROWTH 5 DAYS Performed at [facility], [place] [address], [place], [place] [address]    Report Status 01/18/2020 FINAL  Final  Culture, blood (routine x 2)     Status: None   Collection Time: 01/13/20  4:04 PM   Specimen: BLOOD  Result Value Ref Range Status   Specimen Description BLOOD SITE NOT SPECIFIED  Final   Special Requests   Final    BOTTLES DRAWN AEROBIC ONLY Blood Culture results may not be optimal due to an inadequate volume of blood received in culture bottles   Culture   Final    NO GROWTH 5 DAYS Performed at [facility], [place] [address] [place], [place] [address]    Report Status 01/18/2020 FINAL  Final  Surgical PCR screen     Status: None   Collection Time: 01/14/20  2:39 PM   Specimen: Nasal Mucosa; Nasal Swab  Result Value Ref Range Status   MRSA, PCR  NEGATIVE NEGATIVE Final   Staphylococcus aureus NEGATIVE NEGATIVE Final    Comment: (NOTE) The Xpert SA Assay (FDA approved for NASAL specimens in patients 35 years of age and older), is one component of a comprehensive surveillance program. It is not intended to diagnose infection nor to guide or monitor treatment. Performed at [facility], [place] [address] [place], [place] [address]          Radiology Studies: DG Chest 2 View  Result Date: 01/19/2020 CLINICAL DATA:  Generalized weakness. EXAM: CHEST - 2 VIEW COMPARISON:  January 15, 2020 FINDINGS: A dual lead AICD is noted. There is no evidence of acute infiltrate, pleural effusion or pneumothorax. The cardiac silhouette is mildly enlarged and unchanged in size. Marked severity calcification of the aortic arch is noted. The visualized skeletal structures are unremarkable. IMPRESSION: No active cardiopulmonary disease. Electronically Signed   By: Virgina Norfolk M.D.   On: 01/19/2020 01:15   CT Angio Chest PE W and/or Wo Contrast  Result Date: 01/19/2020 CLINICAL DATA:  Low to intermediate probability for pulmonary embolism. Positive D-dimer. EXAM: CT ANGIOGRAPHY CHEST WITH CONTRAST TECHNIQUE: Multidetector CT imaging of the chest was performed using the standard protocol during bolus administration  of intravenous contrast. Multiplanar CT image reconstructions and MIPs were obtained to evaluate the vascular anatomy. CONTRAST:  55m OMNIPAQUE IOHEXOL 350 MG/ML SOLN COMPARISON:  None. FINDINGS: Cardiovascular: Normal heart size. No pericardial effusion. There is aortic and coronary atherosclerosis. No pulmonary artery filling defect Mediastinum/Nodes: Negative for adenopathy or mass. Lungs/Pleura: The central airways are clear. Right posterior costophrenic sulcus opacity with volume loss attributed atelectasis. Minimal apical emphysema. Upper Abdomen: Lobulated liver raising the possibility of cirrhosis, also seen described on a 2018  CT. Cholecystectomy. No acute finding. Musculoskeletal: No acute or aggressive finding. Review of the MIP images confirms the above findings. IMPRESSION: 1. No acute finding, including pulmonary embolism. 2. Mild dependent atelectasis. 3. Aortic Atherosclerosis (ICD10-I70.0). Electronically Signed   By: JMonte FantasiaM.D.   On: 01/19/2020 05:01        Scheduled Meds: . aspirin  325 mg Oral Daily  . enoxaparin (LOVENOX) injection  40 mg Subcutaneous Q24H  . fluticasone furoate-vilanterol  1 puff Inhalation Daily   And  . umeclidinium bromide  1 puff Inhalation Daily  . insulin aspart  0-20 Units Subcutaneous TID WC  . insulin glargine  20 Units Subcutaneous Daily  . irbesartan  150 mg Oral Daily  . mouth rinse  15 mL Mouth Rinse BID  . metoprolol succinate  50 mg Oral Daily  . pravastatin  20 mg Oral Daily  . sodium chloride flush  3 mL Intravenous Once  . sodium chloride flush  3 mL Intravenous Q12H   Continuous Infusions: . cefTRIAXone (ROCEPHIN)  IV       LOS: 1 day    Time spent: 35 minutes    Naven Giambalvo A Takyla Kuchera, MD [facility]   If 7PM-7AM, please contact night-coverage [email or web address]  01/20/2020, 7:55 AM

## 2020-01-20 NOTE — Consult Note (Signed)
[facility] Consult   Reason for Consult: Capacity for living arrangements Referring Physician: Dr. Tyrell Antonio Patient Identification: [name] MRN:  [id number] Principal Diagnosis: Acute metabolic encephalopathy Diagnosis:  Principal Problem:   Acute metabolic encephalopathy Active Problems:   COPD mixed type ([place])   Type II diabetes mellitus, uncontrolled ([place])   Hypertension   Acute on chronic respiratory failure with hypoxia (HCC)   Elevated troponin   UTI (urinary tract infection)   Status post placement of cardiac pacemaker   Leukocytosis   Total Time spent with patient: 45 minutes  Subjective:   [name] is a 84 y.o. female patient admitted with hypoxemia and memory issues. Today on her evaluation she is alert and oriented, engages well with Probation officer, and is very pleasant. She is unable to recall what happened that lead to this admission. She states she was just discharged home on Friday 09/10 by Dr. Lovena Le after having a pacemaker placed. "  Despite my age I am very smart.  Obtain my own bills.  I take care of myself. May need a little bit of assistance when I leave the hospital but not enough to be placed until 1 of those facilities.  I know several people who have not had good luck with those facilities.  I would be interested in some therapy possibly physical therapy, however I would think you are trying to keep me there forever.  My mind is fuzzy with my brain, and I think it is secondary to my heart problems.  Has anyone reached out to Dr. Lovena Le to let him know I am in the hospital.  I was awake when he placed my pacemaker, and he felt as though I did exceptionally well in a quick recovery which is why was able to go home so soon."  Patient was informed about her reasons for coming into the hospital, and she states " I knew something was wrong with me.  I just did not know what it was.  I can remember my birthday, I just knew was a very important date  to me.  Me and my mother she had the same birthday [birth year]."  Patient appears to be open to a skill nursing facility for physical therapy, speech therapy, occupational therapy and other ancillary services as determined by physiologist.  Patient does inquire about lip balm, stating' I have asked 3 times for someone to get him with a lip balm.  Since you are a psychiatrist tell them I am severely depressed because my lips are chapped and dry.  May be that we will make them get me something for my lips. "  Patient very humorous and [place], she is advised I will tried to locate her some lip balm.   HPI:  84 y.o. female presenting with confusion, hypoxia with O2 sat 70% on RA, and bradycardia with EKG (+) third-degree AV block. PMHx significant for HTN, HLD, DM w/ neuropathy, chronic hypoxemic respiratory failure 2/2 underlying COPD on 2.5-3L O2 at baseline, obesity, and anxiety.   Past Psychiatric History: Denies  Risk to Self:  Denies Risk to Others:  Denies Prior Inpatient Therapy:  Denies Prior Outpatient Therapy:  Denies  Past Medical History:  Past Medical History:  Diagnosis Date   Allergic rhinitis, cause unspecified    Sinus CT Rec 12-23-2009   Arthritis    Benign paroxysmal positional vertigo 08/09/2012   Chronic airway obstruction, not elsewhere classified    HFA 75-90% after coaching 12-23-2009  Complication of anesthesia    takes along time to wake up   Diabetes mellitus    Diarrhea    Diverticulosis    Esophageal reflux    Esophageal stricture    GERD (gastroesophageal reflux disease)    Glaucoma    Hiatal hernia    Hypertension    Irritable bowel syndrome    Neuropathy in diabetes ([place]) 08/09/2012   Other chronic nonalcoholic liver disease    Oxygen deficiency    Presence of permanent cardiac pacemaker 01/14/2020   Sciatica    Scoliosis    Shingles 2010   Shortness of breath    Steatohepatitis     Past Surgical History:  Procedure Laterality  Date   APPENDECTOMY     CARPAL TUNNEL RELEASE     right hand   CHOLECYSTECTOMY OPEN  1978   COLONOSCOPY  07-2001   mild diverticulosis   ESOPHAGOGASTRODUODENOSCOPY  2841,32-44   H Hernia,es.stricture s/p dil 32F   INTRAOCULAR LENS INSERTION Bilateral    LIVER BIOPSY  0-1027   PACEMAKER IMPLANT N/A 01/14/2020   Procedure: PACEMAKER IMPLANT;  Surgeon: Evans Lance, MD;  Location: [facility];  Service: Cardiovascular;  Laterality: N/A;   PARATHYROID EXPLORATION     TONSILLECTOMY AND ADENOIDECTOMY     TOTAL ABDOMINAL HYSTERECTOMY     ULNAR NERVE TRANSPOSITION  12/07/2011   Procedure: ULNAR NERVE DECOMPRESSION/TRANSPOSITION;this was cancelled-not done  Surgeon: Cammie Sickle., MD;  Location: [facility];  Service: Orthopedics;  Laterality: Right;  right ulnar nerve in situ decompression   ULNAR TUNNEL RELEASE  03/07/2012   Procedure: CUBITAL TUNNEL RELEASE;  Surgeon: Roseanne Kaufman, MD;  Location: [facility];  Service: Orthopedics;  Laterality: Right;  ulnar nerve release at the elbow     Family History:  Family History  Problem Relation Age of Onset   Heart disease Father    Lung cancer Mother        small cell;Byssinosis   Lung cancer Sister    Liver cancer Sister        ? mets from another area of the body   Diabetes Other        grandmother   Stroke Maternal Grandfather    Family Psychiatric  History: Denies Social History:  Social History   Substance and Sexual Activity  Alcohol Use No   Alcohol/week: 0.0 standard drinks     Social History   Substance and Sexual Activity  Drug Use No    Social History   Socioeconomic History   Marital status: Single    Spouse name: Not on file   Number of children: 0   Years of education: Not on file   Highest education level: Not on file  Occupational History   Occupation: Retired   Tobacco Use   Smoking status: Former Smoker    Types: Cigarettes    Quit  date: 05/08/1988    Years since quitting: 31.7   Smokeless tobacco: Never Used  Scientific laboratory technician Use: Never used  Substance and Sexual Activity   Alcohol use: No    Alcohol/week: 0.0 standard drinks   Drug use: No   Sexual activity: Not Currently  Other Topics Concern   Not on file  Social History Narrative   Not on file   Social Determinants of Health   Financial Resource Strain:    Difficulty of Paying Living Expenses: Not on file  Food Insecurity:    Worried About  Running Out of Food in the Last Year: Not on file   Ran Out of Food in the Last Year: Not on file  Transportation Needs:    Lack of Transportation (Medical): Not on file   Lack of Transportation (Non-Medical): Not on file  Physical Activity:    Days of Exercise per Week: Not on file   Minutes of Exercise per Session: Not on file  Stress:    Feeling of Stress : Not on file  Social Connections:    Frequency of Communication with Friends and Family: Not on file   Frequency of Social Gatherings with Friends and Family: Not on file   Attends Religious Services: Not on file   Active Member of Clubs or Organizations: Not on file   Attends Archivist Meetings: Not on file   Marital Status: Not on file   Additional Social History:    Allergies:   Allergies  Allergen Reactions   Chlordiazepoxide-Clidinium Other (See Comments)    Sleepy,weak   Biaxin [Clarithromycin] Other (See Comments)    Foul taste, abd pain, diarrhea   Tramadol Other (See Comments)    Caused tremors   Codeine Other (See Comments)    REACTION: gi upset- only in high doses per pt.  Can tolerate cough syrup with codeine.     Labs:  Results for orders placed or performed during the hospital encounter of 01/18/20 (from the past 48 hour(s))  Urinalysis, Routine w reflex microscopic Urine, Clean Catch     Status: Abnormal   Collection Time: 01/18/20  4:59 PM  Result Value Ref Range   Color, Urine YELLOW  YELLOW   APPearance HAZY (A) CLEAR   Specific Gravity, Urine 1.015 1.005 - 1.030   pH 5.0 5.0 - 8.0   Glucose, UA >=500 (A) NEGATIVE mg/dL   Hgb urine dipstick SMALL (A) NEGATIVE   Bilirubin Urine NEGATIVE NEGATIVE   Ketones, ur 80 (A) NEGATIVE mg/dL   Protein, ur 30 (A) NEGATIVE mg/dL   Nitrite NEGATIVE NEGATIVE   Leukocytes,Ua LARGE (A) NEGATIVE   RBC / HPF 0-5 0 - 5 RBC/hpf   WBC, UA 21-50 0 - 5 WBC/hpf   Bacteria, UA FEW (A) NONE SEEN   Squamous Epithelial / LPF 6-10 0 - 5   Hyaline Casts, UA PRESENT     Comment: Performed at [facility], [address] [place], [place] [address]  Basic metabolic panel     Status: Abnormal   Collection Time: 01/18/20  5:27 PM  Result Value Ref Range   Sodium 136 135 - 145 mmol/L   Potassium 4.0 3.5 - 5.1 mmol/L   Chloride 94 (L) 98 - 111 mmol/L   CO2 28 22 - 32 mmol/L   Glucose, Bld 250 (H) 70 - 99 mg/dL    Comment: Glucose reference range applies only to samples taken after fasting for at least 8 hours.   BUN 8 8 - 23 mg/dL   Creatinine, Ser 0.76 0.44 - 1.00 mg/dL   Calcium 8.9 8.9 - 10.3 mg/dL   GFR calc non Af Amer >60 >60 mL/min   GFR calc Af Amer >60 >60 mL/min   Anion gap 14 5 - 15    Comment: Performed at [facility] [address], [place], [place] [address]  CBC     Status: Abnormal   Collection Time: 01/18/20  5:27 PM  Result Value Ref Range   WBC 11.9 (H) 4.0 - 10.5 K/uL   RBC  5.31 (H) 3.87 - 5.11 MIL/uL   Hemoglobin 13.9 12.0 - 15.0 g/dL   HCT 46.1 (H) 36 - 46 %   MCV 86.8 80.0 - 100.0 fL   MCH 26.2 26.0 - 34.0 pg   MCHC 30.2 30.0 - 36.0 g/dL   RDW 15.3 11.5 - 15.5 %   Platelets 206 150 - 400 K/uL   nRBC 0.0 0.0 - 0.2 %    Comment: Performed at [facility] [address], [place], [place] [address]  CBG monitoring, ED     Status: Abnormal   Collection Time: 01/18/20 11:37 PM  Result Value Ref Range   Glucose-Capillary 256 (H) 70 - 99 mg/dL    Comment: Glucose reference range applies only to  samples taken after fasting for at least 8 hours.  D-dimer, quantitative (not at [facility])     Status: Abnormal   Collection Time: 01/19/20 12:10 AM  Result Value Ref Range   D-Dimer, Quant 3.71 (H) 0.00 - 0.50 ug/mL-FEU    Comment: (NOTE) At the manufacturer cut-off of 0.50 ug/mL FEU, this assay has been documented to exclude PE with a sensitivity and negative predictive value of 97 to 99%.  At this time, this assay has not been approved by the FDA to exclude DVT/VTE. Results should be correlated with clinical presentation. Performed at [facility], [place] [address] [place], [place] [address]   Troponin I (High Sensitivity)     Status: Abnormal   Collection Time: 01/19/20 12:10 AM  Result Value Ref Range   Troponin I (High Sensitivity) 68 (H) <18 ng/L    Comment: (NOTE) Elevated high sensitivity troponin I (hsTnI) values and significant  changes across serial measurements may suggest ACS but many other  chronic and acute conditions are known to elevate hsTnI results.  Refer to the "Links" section for chest pain algorithms and additional  guidance. Performed at [facility], [place] [address], [place], [place] [address]   Troponin I (High Sensitivity)     Status: Abnormal   Collection Time: 01/19/20  2:06 AM  Result Value Ref Range   Troponin I (High Sensitivity) 71 (H) <18 ng/L    Comment: (NOTE) Elevated high sensitivity troponin I (hsTnI) values and significant  changes across serial measurements may suggest ACS but many other  chronic and acute conditions are known to elevate hsTnI results.  Refer to the "Links" section for chest pain algorithms and additional  guidance. Performed at [facility], [place] [address] [place], [place] [address]   Glucose, capillary     Status: Abnormal   Collection Time: 01/19/20 10:00 AM  Result Value Ref Range   Glucose-Capillary 228 (H) 70 - 99 mg/dL    Comment: Glucose reference range applies only to samples taken after  fasting for at least 8 hours.  Glucose, capillary     Status: Abnormal   Collection Time: 01/19/20 11:21 AM  Result Value Ref Range   Glucose-Capillary 255 (H) 70 - 99 mg/dL    Comment: Glucose reference range applies only to samples taken after fasting for at least 8 hours.  Glucose, capillary     Status: Abnormal   Collection Time: 01/19/20  4:58 PM  Result Value Ref Range   Glucose-Capillary 129 (H) 70 - 99 mg/dL    Comment: Glucose reference range applies only to samples taken after fasting for at least 8 hours.  CBC     Status: Abnormal   Collection Time: 01/20/20  6:25 AM  Result Value Ref Range   WBC 9.1 4.0 - 10.5 K/uL   RBC 5.02 3.87 - 5.11 MIL/uL   Hemoglobin 13.2 12.0 - 15.0 g/dL   HCT 44.1 36 - 46 %   MCV 87.8 80.0 - 100.0 fL   MCH 26.3 26.0 - 34.0 pg   MCHC 29.9 (L) 30.0 - 36.0 g/dL   RDW 15.6 (H) 11.5 - 15.5 %   Platelets 140 (L) 150 - 400 K/uL   nRBC 0.0 0.0 - 0.2 %    Comment: Performed at [facility] [address] [place], [place] [address]  Basic metabolic panel     Status: Abnormal   Collection Time: 01/20/20  6:25 AM  Result Value Ref Range   Sodium 137 135 - 145 mmol/L   Potassium 4.3 3.5 - 5.1 mmol/L   Chloride 97 (L) 98 - 111 mmol/L   CO2 30 22 - 32 mmol/L   Glucose, Bld 199 (H) 70 - 99 mg/dL    Comment: Glucose reference range applies only to samples taken after fasting for at least 8 hours.   BUN 8 8 - 23 mg/dL   Creatinine, Ser 0.61 0.44 - 1.00 mg/dL   Calcium 8.9 8.9 - 10.3 mg/dL   GFR calc non Af Amer >60 >60 mL/min   GFR calc Af Amer >60 >60 mL/min   Anion gap 10 5 - 15    Comment: Performed at [facility] [address] [name], [place] [address]  Vitamin B12     Status: None   Collection Time: 01/20/20  8:32 AM  Result Value Ref Range   Vitamin B-12 255 180 - 914 pg/mL    Comment: (NOTE) This assay is not validated for testing neonatal or myeloproliferative syndrome specimens for Vitamin B12 levels. Performed at [facility], [place] [address] [place], [place] [address]   Glucose, capillary     Status: Abnormal   Collection Time: 01/20/20  8:44 AM  Result Value Ref Range   Glucose-Capillary 203 (H) 70 - 99 mg/dL    Comment: Glucose reference range applies only to samples taken after fasting for at least 8 hours.  Glucose, capillary     Status: Abnormal   Collection Time: 01/20/20 11:43 AM  Result Value Ref Range   Glucose-Capillary 198 (H) 70 - 99 mg/dL    Comment: Glucose reference range applies only to samples taken after fasting for at least 8 hours.   *Note: Due to a large number of results and/or encounters for the requested time period, some results have not been displayed. A complete set of results can be found in Results Review.    Current Facility-Administered Medications  Medication Dose Route Frequency Provider Last Rate Last Admin   acetaminophen (TYLENOL) tablet 650 mg  650 mg Oral Q6H PRN Fuller Plan A, MD   650 mg at 01/20/20 1036   Or   acetaminophen (TYLENOL) suppository 650 mg  650 mg Rectal Q6H PRN Norval Morton, MD       ALPRAZolam Duanne Moron) tablet 0.25 mg  0.25 mg Oral BID PRN Fuller Plan A, MD       aspirin tablet 325 mg  325 mg Oral Daily Smith, Rondell A, MD   325 mg at 01/20/20 0900   [START ON 01/21/2020] cefTRIAXone (ROCEPHIN) 1 g in sodium chloride 0.9 % 100 mL IVPB  1 g Intravenous Q24H Regalado, Belkys A, MD  enoxaparin (LOVENOX) injection 40 mg  40 mg Subcutaneous Q24H Smith, Rondell A, MD   40 mg at 01/20/20 0903   hydrALAZINE (APRESOLINE) tablet 25 mg  25 mg Oral Q6H PRN Regalado, Belkys A, MD       insulin aspart (novoLOG) injection 0-20 Units  0-20 Units Subcutaneous TID WC Smith, Rondell A, MD   4 Units at 01/20/20 1230   insulin glargine (LANTUS) injection 20 Units  20 Units Subcutaneous Daily Fuller Plan A, MD   20 Units at 01/20/20 0858   irbesartan (AVAPRO) tablet 150 mg  150 mg Oral Daily Smith, Rondell A, MD   150 mg at 01/20/20  0900   levalbuterol (XOPENEX) nebulizer solution 0.63 mg  0.63 mg Nebulization Q6H PRN Fuller Plan A, MD       MEDLINE mouth rinse  15 mL Mouth Rinse BID Tamala Julian, Rondell A, MD   15 mL at 01/20/20 0913   metoprolol succinate (TOPROL-XL) 24 hr tablet 50 mg  50 mg Oral Daily Cardama, Grayce Sessions, MD   50 mg at 01/20/20 0859   pravastatin (PRAVACHOL) tablet 20 mg  20 mg Oral Daily Smith, Rondell A, MD   20 mg at 01/20/20 0902   sodium chloride flush (NS) 0.9 % injection 3 mL  3 mL Intravenous Once Cardama, Grayce Sessions, MD       sodium chloride flush (NS) 0.9 % injection 3 mL  3 mL Intravenous Q12H Smith, Rondell A, MD   3 mL at 01/20/20 0905   sodium chloride HYPERTONIC 3 % nebulizer solution 4 mL  4 mL Nebulization Q6H PRN Norval Morton, MD       Tiotropium Bromide-Olodaterol 2.5-2.5 MCG/ACT AERS 2 puff  2 puff Inhalation Daily Regalado, Belkys A, MD   2 puff at 01/20/20 1006    Musculoskeletal: Strength & Muscle Tone: within normal limits Gait & Station: normal Patient leans: N/A  Psychiatric Specialty Exam: Physical Exam  Review of Systems  Blood pressure (!) 185/67, pulse 92, temperature 98.6 F (37 C), temperature source Oral, resp. rate 18, height 5' 3"  (1.6 m), weight 81.1 kg, SpO2 97 %.Body mass index is 31.67 kg/m.  General Appearance: Casual and Fairly Groomed  Eye Contact:  Good  Speech:  Clear and Coherent and Normal Rate  Volume:  Normal  Mood:  Euthymic  Affect:  Congruent  Thought Process:  Coherent, Goal Directed and Descriptions of Associations: Circumstantial  Orientation:  Full (Time, Place, and Person)  Thought Content:  WDL  Suicidal Thoughts:  No  Homicidal Thoughts:  No  Memory:  Immediate;   Good Recent;   Fair Remote;   Fair  Judgement:  Intact  Insight:  Fair  Psychomotor Activity:  Normal and Tremor  Concentration:  Concentration: Good and Attention Span: Good  Recall:  Junction of Knowledge:  Good  Language:  Good  Akathisia:  No   Handed:  Right  AIMS (if indicated):     Assets:  Communication Skills Desire for Improvement Financial Resources/Insurance Housing Leisure Time Physical Health Social Support  ADL's:  Intact  Cognition:  Impaired,  Mild  Sleep:        Treatment Plan Summary: Per chart review, patient was admitted on September 12 with hypoxemia, tachycardia, increased respiration and positive urinalysis for large leukocytes.  She was recently admitted from September 7 through September 10 for acute metabolic encephalopathy that was originally thought to be secondary to the heart block and hypoxia.  Throughout her hospital stay  there has been some concern regarding chronic urinary tract infection potentially Klebsiella, in which she received 3 days of Rocephin.  Of note patient was admitted last week.  She has no previous psychiatric history.  She does report intermittent memory loss, however is able to maintain her ADLs and IADLs at home independently prior to coming to the hospital.  She states she has been able to pay her own bills, and manage her husband's affairs without the assistance of anyone.  She does identify an extensive support system to include [name] and [place] who were able to come to the home to assist her if needed.   On interview, [name] reports that she is receiving treatment for her heart, urinary tract infection, and some memory issues.  She initially reports that she does not know what brought her into the hospital however she knew she did not feel right.  She is aware of concern for possible infection, in addition to her discharge from pacemaker placement that could have resulted in her admission.  She states she is open to skilled nursing for therapy, however does not wish to make it permanent.  She initially reports some hesitancy as she feels they are trying to stick her in a nursing home.  She does state what would happen if she were to become sick, and appreciates the  seriousness of her condition including risk of death without receiving proper treatment.  She reports that she has a support system, and can safely rely on at least 3 individuals who can come to help her at home.  She denies suicidal ideations, homicidal ideations, and or auditory visual hallucination.  She denies a history of suicide attempts, suicidal ideations, and or nonsuicidal self injuries behavior.  She denies a history of mood symptoms.   At this time patient does have the capacity to make her own living arrangements.  Psychiatry to sign off at this time.  Will suggest working closely with social work and rehabilitation specialist to determine if she is a good candidate for CIR.  -Order placed for lip balm  Disposition: No evidence of imminent risk to self or others at present.   Patient does not meet criteria for psychiatric inpatient admission.  Suella Broad, FNP 01/20/2020 1:09 PM

## 2020-01-20 NOTE — Plan of Care (Signed)
  Problem: Health Behavior/Discharge Planning: Goal: Ability to manage health-related needs will improve Outcome: Progressing   

## 2020-01-21 ENCOUNTER — Telehealth: Payer: Self-pay | Admitting: Internal Medicine

## 2020-01-21 LAB — GLUCOSE, CAPILLARY
Glucose-Capillary: 178 mg/dL — ABNORMAL HIGH (ref 70–99)
Glucose-Capillary: 180 mg/dL — ABNORMAL HIGH (ref 70–99)
Glucose-Capillary: 211 mg/dL — ABNORMAL HIGH (ref 70–99)
Glucose-Capillary: 240 mg/dL — ABNORMAL HIGH (ref 70–99)

## 2020-01-21 NOTE — Telephone Encounter (Signed)
[name] called, she states she got a message from the patient saying she needed to speak with Dr. Lovena Le. Per Freda Munro, the patient has been trying to get the doctors at [facility] to contact Dr. Lovena Le for her. She states that the patient is very eager to speak with him because she is going into Rehab after leaving the hospital for 24hr care. Freda Munro states she is at [facility] on the 5th floor. She does not have a number to reach her at.

## 2020-01-21 NOTE — Consult Note (Signed)
   [facility] Inpatient Consult   01/21/2020  AMIE COWENS [birth year] [id number]  [facility] [ACO] Patient: Medicare   Patient screened for unplanned readmission with less than 7 days readmission hospitalization to check if potential [facility] service needs.  Review of patient's medical record reveals patient is currently being recommended for a skilled nursing facility level of care.   Primary Care Provider is showing as "patient with no PCP" in network  Plan: Will sign off.   For questions contact:   Natividad Brood, RN BSN [facility] Liaison  [phone] business mobile phone Toll free office [phone]  Fax number: [phone] [email or web address]

## 2020-01-21 NOTE — Telephone Encounter (Signed)
Returned call to [facility].  Per EC, Pt wants to speak with Dr. Lovena Le because "he is her doctor" and she is nervous.  Will see if RU can stop by Pt's room to comfort Pt.    Sent message to RU.

## 2020-01-21 NOTE — Progress Notes (Signed)
PROGRESS NOTE    [name]  JAS:[id number] DOB: 1935-09-12 DOA: 01/18/2020 PCP: Patient, No Pcp Per   Brief Narrative: 84/F with past medical history significant for hypertension, diabetes, neuropathy, chronic hypoxic respiratory failure on 2 on 3 L of oxygen at home, COPD, obesity, was brought to the emergency department after she was found hypoxic.  Per report her caregiver called [facility] Department who performed a  welfare check and found the patient sitting in chair with oxygen saturation 85% on room air, oxygen tank was empty. -Of note patient recently hospitalized from 9/7 until 9/10 for acute metabolic encephalopathy thought to be secondary to heart block and hypoxia with signs of Klebsiella UTI for she was treated with 3 days of Rocephin. -At baseline patient lives alone and has a care giver that comes and check on her. -Patient reports memory problems.  Evaluation in the ED patient was afebrile, urinalysis was abnormal, oxygen saturation maintained on 4 L of oxygen.  High sensitive troponin 68/71 D-dimer 3.7.  CT angio chest negative for PE.   Assessment & Plan:  Acute metabolic encephalopathy; -In the setting of hypoxemia and possibly UTI -Does have memory loss, early dementia at baseline -Stabilized with supplemental oxygen and treatment of UTI -Imaging unremarkable, CT notes atrophy -PT OT eval completed, SNF recommended -Patient has no immediate family, has a friend who has been helping her make decisions, evaluated by psych who felt patient did not have capacity -Discharge planning, social work consulted for rehab  Suspected UTI;  -UA with 21-50 WBC, many bacteria -Urine culture with multiple species, day 2 of ceftriaxone today -Continue this for 1 more day  Acute on chronic respiratory failure.  Ran out of oxygen.  -Stable and improved with supplemental O2 which she is supposed to be on at baseline for COPD  Mild thrombocytopenia; Follow  trend  Elevated D dimer; CT angio Negative.   Elevated troponin; Demand ischemia, due to hypoxemia. -No symptoms  Complete Heart block S/P pacemaker: Pacemaker place 01/14/2020.  Diabetes;Mellitus type 2;  Continue with sliding scale insulin. Continue with Lantus.  HTN; Continue with avapro and metoprolol  Anxiety: With Xanax as needed.  HLD; Continue with pravastatin  Obesity: BMI 30  COPD: Continue with inhaler  Constipation;  -Continue laxatives   Pressure Injury 01/13/20 Sacrum Medial Stage 1 -  Intact skin with non-blanchable redness of a localized area usually over a bony prominence. (Active)  01/13/20 2046  Location: Sacrum  Location Orientation: Medial  Staging: Stage 1 -  Intact skin with non-blanchable redness of a localized area usually over a bony prominence.  Wound Description (Comments):   Present on Admission: Yes     Pressure Injury 01/19/20 Buttocks Stage 2 -  Partial thickness loss of dermis presenting as a shallow open injury with a red, pink wound bed without slough. redness (Active)  01/19/20 1400  Location: Buttocks  Location Orientation:   Staging: Stage 2 -  Partial thickness loss of dermis presenting as a shallow open injury with a red, pink wound bed without slough.  Wound Description (Comments): redness  Present on Admission: Yes    DVT prophylaxis: Lovenox Code Status: Full code Family Communication: No family at bedside, patient said that she does not have any family  Disposition Plan:  Status is: Inpatient  Remains inpatient appropriate because:Ongoing diagnostic testing needed not appropriate for outpatient work up   Dispo: The patient is from: Home  Anticipated d/c is to: SNF              Anticipated d/c date is: 1 days              Patient currently is not medically stable to d/c.     Consultants:   Psych  Procedures:   none  Antimicrobials:   Ceftriaxone  Subjective: -No events overnight,  intermittent cognitive deficits noted  Objective: Vitals:   01/20/20 0845 01/20/20 1631 01/20/20 2053 01/21/20 0542  BP: (!) 185/67 (!) 183/75 (!) 181/77 (!) 177/77  Pulse: 92 82 79 86  Resp: 18 20 16 16   Temp: 98.6 F (37 C) 99.1 F (37.3 C) 98.4 F (36.9 C) 98.4 F (36.9 C)  TempSrc: Oral Oral Oral Oral  SpO2: 97% 98% 95% 97%  Weight:   81.1 kg   Height:        Intake/Output Summary (Last 24 hours) at 01/21/2020 1125 Last data filed at 01/21/2020 0830 Gross per 24 hour  Intake 0 ml  Output 2050 ml  Net -2050 ml   Filed Weights   01/19/20 0010 01/19/20 1945 01/20/20 2053  Weight: 77.1 kg 81.1 kg 81.1 kg    Examination:  General exam: Elderly pleasant female sitting up in bed, awake alert oriented to self, place and partly to time, mild cognitive deficits noted HEENT: No JVD CVS: S1-S2, regular rate rhythm Lungs: Clear bilaterally Abdomen: Soft, nontender, bowel sounds present Extremities: No edema Neuro: Moves all extremities, no localizing signs    Data Reviewed: I have personally reviewed following labs and imaging studies  CBC: Recent Labs  Lab 01/15/20 0708 01/18/20 1727 01/20/20 0625  WBC 10.4 11.9* 9.1  HGB 12.9 13.9 13.2  HCT 43.0 46.1* 44.1  MCV 87.9 86.8 87.8  PLT 162 206 604*   Basic Metabolic Panel: Recent Labs  Lab 01/15/20 0708 01/18/20 1727 01/20/20 0625  NA 139 136 137  K 4.3 4.0 4.3  CL 100 94* 97*  CO2 30 28 30   GLUCOSE 172* 250* 199*  BUN 21 8 8   CREATININE 0.72 0.76 0.61  CALCIUM 8.7* 8.9 8.9  MG 2.3  --   --   PHOS 3.5  --   --    GFR: Estimated Creatinine Clearance: 54.7 mL/min (by C-G formula based on SCr of 0.61 mg/dL). Liver Function Tests: No results for input(s): AST, ALT, ALKPHOS, BILITOT, PROT, ALBUMIN in the last 168 hours. No results for input(s): LIPASE, AMYLASE in the last 168 hours. No results for input(s): AMMONIA in the last 168 hours. Coagulation Profile: No results for input(s): INR, PROTIME in the  last 168 hours. Cardiac Enzymes: No results for input(s): CKTOTAL, CKMB, CKMBINDEX, TROPONINI in the last 168 hours. BNP (last 3 results) No results for input(s): PROBNP in the last 8760 hours. HbA1C: No results for input(s): HGBA1C in the last 72 hours. CBG: Recent Labs  Lab 01/20/20 0844 01/20/20 1143 01/20/20 1624 01/20/20 2054 01/21/20 0646  GLUCAP 203* 198* 164* 168* 178*   Lipid Profile: No results for input(s): CHOL, HDL, LDLCALC, TRIG, CHOLHDL, LDLDIRECT in the last 72 hours. Thyroid Function Tests: No results for input(s): TSH, T4TOTAL, FREET4, T3FREE, THYROIDAB in the last 72 hours. Anemia Panel: Recent Labs    01/20/20 0832  VITAMINB12 255   Sepsis Labs: No results for input(s): PROCALCITON, LATICACIDVEN in the last 168 hours.  Recent Results (from the past 240 hour(s))  Urine culture     Status: Abnormal   Collection Time: 01/13/20  12:42 PM   Specimen: Urine, Catheterized  Result Value Ref Range Status   Specimen Description URINE, CATHETERIZED  Final   Special Requests NONE  Final   Culture (A)  Final    >=100,000 COLONIES/mL KLEBSIELLA PNEUMONIAE >=100,000 COLONIES/mL LACTOBACILLUS SPECIES Standardized susceptibility testing for this organism is not available. Performed at [facility], [place] [address], [place], [place] [address]    Report Status 01/16/2020 FINAL  Final   Organism ID, Bacteria KLEBSIELLA PNEUMONIAE (A)  Final      Susceptibility   Klebsiella pneumoniae - MIC*    AMPICILLIN >=32 RESISTANT Resistant     CEFAZOLIN <=4 SENSITIVE Sensitive     CEFTRIAXONE <=0.25 SENSITIVE Sensitive     CIPROFLOXACIN <=0.25 SENSITIVE Sensitive     GENTAMICIN <=1 SENSITIVE Sensitive     IMIPENEM <=0.25 SENSITIVE Sensitive     NITROFURANTOIN 128 RESISTANT Resistant     TRIMETH/SULFA <=20 SENSITIVE Sensitive     AMPICILLIN/SULBACTAM 8 SENSITIVE Sensitive     * >=100,000 COLONIES/mL KLEBSIELLA PNEUMONIAE  SARS Coronavirus 2 by RT PCR (hospital order,  performed in [place] hospital lab) Nasopharyngeal Nasopharyngeal Swab     Status: None   Collection Time: 01/13/20  1:22 PM   Specimen: Nasopharyngeal Swab  Result Value Ref Range Status   SARS Coronavirus 2 NEGATIVE NEGATIVE Final    Comment: (NOTE) SARS-CoV-2 target nucleic acids are NOT DETECTED.  The SARS-CoV-2 RNA is generally detectable in upper and lower respiratory specimens during the acute phase of infection. The lowest concentration of SARS-CoV-2 viral copies this assay can detect is 250 copies / mL. A negative result does not preclude SARS-CoV-2 infection and should not be used as the sole basis for treatment or other patient management decisions.  A negative result may occur with improper specimen collection / handling, submission of specimen other than nasopharyngeal swab, presence of viral mutation(s) within the areas targeted by this assay, and inadequate number of viral copies (<250 copies / mL). A negative result must be combined with clinical observations, patient history, and epidemiological information.  Fact Sheet for Patients:   StrictlyIdeas.no  Fact Sheet for Healthcare Providers: BankingDealers.co.za  This test is not yet approved or  cleared by the [place] FDA and has been authorized for detection and/or diagnosis of SARS-CoV-2 by FDA under an Emergency Use Authorization (EUA).  This EUA will remain in effect (meaning this test can be used) for the duration of the COVID-19 declaration under Section 564(b)(1) of the Act, 21 U.S.C. section 360bbb-3(b)(1), unless the authorization is terminated or revoked sooner.  Performed at [facility], [place] [address], [place], [place] [address]   Culture, blood (routine x 2)     Status: None   Collection Time: 01/13/20  4:03 PM   Specimen: BLOOD  Result Value Ref Range Status   Specimen Description BLOOD SITE NOT SPECIFIED  Final   Special Requests    Final    BOTTLES DRAWN AEROBIC AND ANAEROBIC Blood Culture results may not be optimal due to an inadequate volume of blood received in culture bottles   Culture   Final    NO GROWTH 5 DAYS Performed at [facility], [place] [address], [place], [place] [address]    Report Status 01/18/2020 FINAL  Final  Culture, blood (routine x 2)     Status: None   Collection Time: 01/13/20  4:04 PM   Specimen: BLOOD  Result Value Ref Range Status   Specimen Description BLOOD  SITE NOT SPECIFIED  Final   Special Requests   Final    BOTTLES DRAWN AEROBIC ONLY Blood Culture results may not be optimal due to an inadequate volume of blood received in culture bottles   Culture   Final    NO GROWTH 5 DAYS Performed at [facility], [place] [address], [place], [place] [address]    Report Status 01/18/2020 FINAL  Final  Surgical PCR screen     Status: None   Collection Time: 01/14/20  2:39 PM   Specimen: Nasal Mucosa; Nasal Swab  Result Value Ref Range Status   MRSA, PCR NEGATIVE NEGATIVE Final   Staphylococcus aureus NEGATIVE NEGATIVE Final    Comment: (NOTE) The Xpert SA Assay (FDA approved for NASAL specimens in patients 20 years of age and older), is one component of a comprehensive surveillance program. It is not intended to diagnose infection nor to guide or monitor treatment. Performed at [facility], [place] [address], [place], [place] [address]   Culture, Urine     Status: Abnormal   Collection Time: 01/19/20  8:39 AM   Specimen: Urine, Random  Result Value Ref Range Status   Specimen Description URINE, RANDOM  Final   Special Requests   Final    NONE Performed at [facility], [place] [address], [place], [place] [address]    Culture MULTIPLE SPECIES PRESENT, SUGGEST RECOLLECTION (A)  Final   Report Status 01/20/2020 FINAL  Final         Radiology Studies: DG Abd 1 View  Result Date: 01/20/2020 CLINICAL DATA:  Constipation, right-sided pain. History of  cholecystectomy and appendectomy. EXAM: ABDOMEN - 1 VIEW supine. Right flank and pelvis is collimated off view. COMPARISON:  X-ray abdomen 01/25/2017 FINDINGS: The bowel gas pattern is normal. No radio-opaque calculi or other significant radiographic abnormality are seen. Right upper quadrant surgical clips again noted. Partially visualized leads overlying the lower hemithorax are likely related to a cardiac pacemaker. No acute osseous abnormality. IMPRESSION: Nonobstructive bowel gas pattern with the right flank and pelvis collimated off view. Electronically Signed   By: Iven Finn M.D.   On: 01/20/2020 18:02        Scheduled Meds: . aspirin  325 mg Oral Daily  . enoxaparin (LOVENOX) injection  40 mg Subcutaneous Q24H  . insulin aspart  0-20 Units Subcutaneous TID WC  . insulin glargine  20 Units Subcutaneous Daily  . irbesartan  150 mg Oral Daily  . mouth rinse  15 mL Mouth Rinse BID  . metoprolol succinate  50 mg Oral Daily  . polyethylene glycol  17 g Oral Daily  . pravastatin  20 mg Oral Daily  . sodium chloride flush  3 mL Intravenous Once  . sodium chloride flush  3 mL Intravenous Q12H  . Tiotropium Bromide-Olodaterol  2 puff Inhalation Daily  . vitamin B-12  100 mcg Oral Daily   Continuous Infusions: . cefTRIAXone (ROCEPHIN)  IV 1 g (01/21/20 8413)     LOS: 2 days    Time spent: 25 minutes  Domenic Polite, MD [facility]  01/21/2020, 11:25 AM

## 2020-01-21 NOTE — TOC Initial Note (Signed)
Transition of Care [facility]) - Initial/Assessment Note    Patient Details  Name: [name] MRN: [id number] Date of Birth: [birth year]  Transition [facility]) CM/SW Contact:    Gabrielle Dare Phone Number: 01/21/2020, 4:41 PM  Clinical Narrative:                 CSW met with pt at bedside and introduce self and explained role.  Pt was very pleasant.  Pt is thinking about SNF for rehab.  Pt does not want to leave her cat "Sassy" for too long.  Her friend [name] is taking care of her cat currently while she is in the hospital.  Pt lives alone in a one story apartment.  Prior to admission pt was able to ambulate in apartment.  Pt does acknowledge that she needs to get stronger.  Pt has given verbal permission to CSW seek SNF's near her current residence so she will be near people that she knows.     Expected Discharge Plan: Skilled Nursing Facility Barriers to Discharge: SNF Pending bed offer, Continued Medical Work up   Patient Goals and CMS Choice Patient states their goals for this hospitalization and ongoing recovery are:: To get stronger CMS Medicare.gov Compare Post Acute Care list provided to:: Other (Comment Required) (Left in pt's room for [facility])    Expected Discharge Plan and Services Expected Discharge Plan: [place]       Living arrangements for the past 2 months: Apartment                                      Prior Living Arrangements/Services Living arrangements for the past 2 months: Apartment Lives with:: Self Patient language and need for interpreter reviewed:: No Do you feel safe going back to the place where you live?: Yes      Need for Family Participation in Patient Care: Yes (Comment) Care giver support system in place?: Yes (comment)   Criminal Activity/Legal Involvement Pertinent to Current Situation/Hospitalization: No - Comment as needed  Activities of Daily Living Home Assistive Devices/Equipment: Shower chair  with back, Walker (specify type), Oxygen (2L [place]) ADL Screening (condition at time of admission) Patient's cognitive ability adequate to safely complete daily activities?: No Is the patient deaf or have difficulty hearing?: No Does the patient have difficulty seeing, even when wearing glasses/contacts?: No Does the patient have difficulty concentrating, remembering, or making decisions?: Yes Patient able to express need for assistance with ADLs?: Yes Does the patient have difficulty dressing or bathing?: No Independently performs ADLs?: Yes (appropriate for developmental age) Does the patient have difficulty walking or climbing stairs?: Yes Weakness of Legs: Both Weakness of Arms/Hands: None  Permission Sought/Granted Permission sought to share information with : Case Manager, Customer service manager, Family Supports Permission granted to share information with : Yes, Verbal Permission Granted  Share Information with NAME: Licensed conveyancer  Permission granted to share info w AGENCY: SNF's  Permission granted to share info w Relationship: Friend  Permission granted to share info w Contact Information: Yes  Emotional Assessment Appearance:: Appears stated age Attitude/Demeanor/Rapport: Engaged, Gracious Affect (typically observed): Accepting, Pleasant Orientation: : Oriented to Self, Oriented to Place, Oriented to Situation Alcohol / Substance Use: Not Applicable Psych Involvement: No (comment)  Admission diagnosis:  UTI (urinary tract infection) [N39.0] SOB (shortness of breath) [R06.02] Acute cystitis with hematuria [N30.01] Patient Active Problem List  Diagnosis Date Noted  . UTI (urinary tract infection) 01/19/2020  . Status post placement of cardiac pacemaker 01/19/2020  . Leukocytosis 01/19/2020  . Pressure injury of skin 01/14/2020  . Complete heart block ([place])   . Acute metabolic encephalopathy   . Acute on chronic respiratory failure with hypoxia ([place])   .  Diabetes ([place])   . Elevated troponin   . Palpitation 01/12/2020  . Scoliosis due to degenerative disease of spine in adult patient 09/10/2018  . Radicular pain of left lower extremity 09/10/2018  . Diarrhea 11/08/2016  . Thrombocytopenia ([place]) 11/08/2016  . Degenerative lumbar spinal stenosis 10/27/2015  . Arthralgia 09/22/2015  . Chronic respiratory failure with hypoxia ([place]) 01/11/2015  . Diabetic polyneuropathy associated with type 2 diabetes mellitus ([place]) 11/16/2014  . Hypertension 05/19/2013  . Vertigo 05/11/2013  . Dizziness 05/11/2013  . Tachycardia 05/11/2013  . Lower urinary tract infectious disease 05/11/2013  . Sinusitis 05/11/2013  . Other and unspecified hyperlipidemia 02/15/2013  . Type II diabetes mellitus, uncontrolled ([place]) 01/01/2013  . Benign paroxysmal positional vertigo 08/09/2012  . Type II or unspecified type diabetes mellitus with neurological manifestations, uncontrolled(250.62) 08/09/2012  . GLAUCOMA 03/16/2010  . HOARSENESS 03/16/2010  . COUGH 12/23/2009  . CHEST PAIN 12/23/2009  . MUSCULOSKELETAL PAIN 01/28/2009  . COPD mixed type ([place]) 01/15/2009  . Seasonal and perennial allergic rhinitis 11/23/2007  . CHRONIC RHINITIS 07/16/2007  . G E R D 05/29/2007  . I B S-DIARRHEAL PREDOMINATE 05/29/2007  . FATTY LIVER DISEASE 05/29/2007   PCP:  Patient, No Pcp Per Pharmacy:   [place], [place] [address] Phone: [phone] Fax: [phone]  [facility] at [place], [place] - [place] [address] [place] [address] Phone: [phone] Fax: [phone]  [facility] [phone] [place], [place] [address] [place] [address] Phone: [phone] Fax: [place], [address]  [facility] [address] [place] [address] Phone: [phone] Fax: [phone]     Social Determinants of Health (SDOH) Interventions    Readmission Risk Interventions No flowsheet data found.

## 2020-01-21 NOTE — TOC Progression Note (Signed)
Transition of Care [facility]) - Progression Note    Patient Details  Name: [name] MRN: [id number] Date of Birth: 04/25/1936  Transition of Care [facility]) CM/SW [place], [place] Phone Number: 01/21/2020, 8:29 AM  Clinical Narrative:    CSW spoke with supervisor Nathaniel Man concerning pt's capacity to make decisions regarding health. Nathaniel Man, Supervisor stated" per [place] surrogate decision making laws, if we have made reasonable attempts to reach blood related relatives with no luck, and the only person left is a friend, we can use that friend to make all decisions as long as they are agreeable."  TOC Team will continue to assist with disposition planning.        Expected Discharge Plan and Services                                                 Social Determinants of Health (SDOH) Interventions    Readmission Risk Interventions No flowsheet data found.

## 2020-01-21 NOTE — Progress Notes (Signed)
   01/21/20 0940  Clinical Encounter Type  Visited With Patient  Visit Type Spiritual support (Discuss AD)  Referral From Nurse  Consult/Referral To Chaplain  Chaplain responded to the consult. I introduced myself and we began talking.  Pt was alert but repeated herself often.  I asked her about the AD and she replied she has no family but has a caregiver that takes care of her and would like her to be HCPA.  We discussed more about this and stated she will call her caregiver and see if this was willing to be her [place]. Informed Pt I will be back to do a follow-up on the AD.    Chaplain Arzell Mcgeehan [place]

## 2020-01-21 NOTE — Progress Notes (Signed)
Physical Therapy Treatment Patient Details Name: [name] MRN: [id number] DOB: 31-Aug-1935 Today's Date: 01/21/2020    History of Present Illness 84 y.o. female presenting with confusion, hypoxia with O2 sat 70% on RA, and bradycardia with EKG (+) third-degree AV block. PMHx significant for HTN, HLD, DM w/ neuropathy, chronic hypoxemic respiratory failure 2/2 underlying COPD on 2.5-3L O2 at baseline, obesity, and anxiety.     PT Comments    Continuing work on functional mobility and activity tolerance;  Session focused on obtaining Orthostatic BPs, to see if BP drop has had a role in her dizziness/decr activity tolerance; Notable for significant SBP drop when comparing supine to standing,coupled with symptoms of dizziness; RN notified;  [name] seemed much more open to going to SNF for post-acute rehab in our conversation during session;     01/21/20 1135  Vital Signs  Patient Position (if appropriate) Orthostatic Vitals  Orthostatic Lying   BP- Lying 159/76  Pulse- Lying 88  Orthostatic Sitting  BP- Sitting 176/82  Pulse- Sitting 95  Orthostatic Standing at 0 minutes  BP- Standing at 0 minutes (!) 106/95  Pulse- Standing at 0 minutes 105  Orthostatic Standing at 3 minutes  BP- Standing at 3 minutes  (Unable to get a reading; Symptomatic for lightheadedness)     Follow Up Recommendations  SNF;Supervision/Assistance - 24 hour     Equipment Recommendations  None recommended by PT    Recommendations for Other Services       Precautions / Restrictions Precautions Precautions: Fall;ICD/Pacemaker Precaution Comments: s/p pacemaker 2 weeks ago    Mobility  Bed Mobility Overal bed mobility: Needs Assistance Bed Mobility: Supine to Sit     Supine to sit: Mod assist     General bed mobility comments: Step-by-step cues for technique; min assist to help LEs clear EOB and heavy mod assist to elevate trunk to sit and square off hips at EOB  Transfers Overall  transfer level: Needs assistance   Transfers: Sit to/from Stand Sit to Stand: +2 physical assistance;Mod assist         General transfer comment: Heavy mod assist to power up to stand in stedy; multimodal cues to stand with hips fully extended  Ambulation/Gait                 Stairs             Wheelchair Mobility    Modified Rankin (Stroke Patients Only)       Balance     Sitting balance-Leahy Scale: Fair       Standing balance-Leahy Scale: Poor Standing balance comment: relies on UE support                            Cognition Arousal/Alertness: Awake/alert Behavior During Therapy: WFL for tasks assessed/performed;Anxious Overall Cognitive Status: No family/caregiver present to determine baseline cognitive functioning                                        Exercises      General Comments General comments (skin integrity, edema, etc.):       Pertinent Vitals/Pain Pain Assessment: Faces Faces Pain Scale: Hurts a little bit Pain Location: unspecified Pain Descriptors / Indicators: Aching Pain Intervention(s): Monitored during session    Home Living  Prior Function            PT Goals (current goals can now be found in the care plan section) Acute Rehab PT Goals Patient Stated Goal: home PT Goal Formulation: With patient Time For Goal Achievement: 02/02/20 Potential to Achieve Goals: Fair Progress towards PT goals: Progressing toward goals (slowly)    Frequency    Min 3X/week (will consider decreasing to min2x/week shen SNF is definitiv)      PT Plan Current plan remains appropriate    Co-evaluation              AM-PAC PT "6 Clicks" Mobility   Outcome Measure  Help needed turning from your back to your side while in a flat bed without using bedrails?: A Little Help needed moving from lying on your back to sitting on the side of a flat bed without using  bedrails?: A Lot Help needed moving to and from a bed to a chair (including a wheelchair)?: A Lot Help needed standing up from a chair using your arms (e.g., wheelchair or bedside chair)?: A Lot Help needed to walk in hospital room?: A Lot Help needed climbing 3-5 steps with a railing? : Total 6 Click Score: 12    End of Session Equipment Utilized During Treatment: Gait belt;Oxygen Activity Tolerance: Patient tolerated treatment well Patient left: in chair;with call bell/phone within reach;with chair alarm set Nurse Communication: Mobility status;Other (comment) (and SBP drop in standing) PT Visit Diagnosis: Unsteadiness on feet (R26.81);Muscle weakness (generalized) (M62.81);Difficulty in walking, not elsewhere classified (R26.2);History of falling (Z91.81)     Time: 1100-1130 PT Time Calculation (min) (ACUTE ONLY): 30 min  Charges:  $Therapeutic Activity: 23-37 mins                     Roney Marion, [place]  Acute Rehabilitation Services Pager [phone] Office [place] 01/21/2020, 1:32 PM

## 2020-01-21 NOTE — NC FL2 (Signed)
[place] MEDICAID FL2 LEVEL OF CARE SCREENING TOOL     IDENTIFICATION  Patient Name: [name] Birthdate: 03-14-1936 Sex: female Admission Date (Current Location): 01/18/2020  [facility] and [place] Number:  Herbalist and Address:  [facility] [place], [place] [address], [place], [place] [address]      Provider Number: [id number]  Attending Physician Name and Address:  Domenic Polite, MD  Relative Name and Phone Number:  Freda Munro (friend) [phone]    Current Level of Care: Hospital Recommended Level of Care: [place] Prior Approval Number:    Date Approved/Denied:   PASRR Number: [id number]  Discharge Plan: SNF    Current Diagnoses: Patient Active Problem List   Diagnosis Date Noted   UTI (urinary tract infection) 01/19/2020   Status post placement of cardiac pacemaker 01/19/2020   Leukocytosis 01/19/2020   Pressure injury of skin 01/14/2020   Complete heart block (HCC)    Acute metabolic encephalopathy    Acute on chronic respiratory failure with hypoxia (HCC)    Diabetes ([place])    Elevated troponin    Palpitation 01/12/2020   Scoliosis due to degenerative disease of spine in adult patient 09/10/2018   Radicular pain of left lower extremity 09/10/2018   Diarrhea 11/08/2016   Thrombocytopenia ([place]) 11/08/2016   Degenerative lumbar spinal stenosis 10/27/2015   Arthralgia 09/22/2015   Chronic respiratory failure with hypoxia ([place]) 01/11/2015   Diabetic polyneuropathy associated with type 2 diabetes mellitus ([place]) 11/16/2014   Hypertension 05/19/2013   Vertigo 05/11/2013   Dizziness 05/11/2013   Tachycardia 05/11/2013   Lower urinary tract infectious disease 05/11/2013   Sinusitis 05/11/2013   Other and unspecified hyperlipidemia 02/15/2013   Type II diabetes mellitus, uncontrolled ([place]) 01/01/2013   Benign paroxysmal positional vertigo 08/09/2012   Type II or unspecified type diabetes  mellitus with neurological manifestations, uncontrolled(250.62) 08/09/2012   GLAUCOMA 03/16/2010   HOARSENESS 03/16/2010   COUGH 12/23/2009   CHEST PAIN 12/23/2009   MUSCULOSKELETAL PAIN 01/28/2009   COPD mixed type ([place]) 01/15/2009   Seasonal and perennial allergic rhinitis 11/23/2007   CHRONIC RHINITIS 07/16/2007   G E R D 05/29/2007   I B S-DIARRHEAL PREDOMINATE 05/29/2007   FATTY LIVER DISEASE 05/29/2007    Orientation RESPIRATION BLADDER Height & Weight     Self, Situation, Place  O2 (nasal cannula 2L) External catheter, Incontinent Weight: 178 lb 12.8 oz (81.1 kg) Height:  5' 3"  (160 cm)  BEHAVIORAL SYMPTOMS/MOOD NEUROLOGICAL BOWEL NUTRITION STATUS      Continent Diet (See DC summary)  AMBULATORY STATUS COMMUNICATION OF NEEDS Skin   Extensive Assist Verbally PU Stage and Appropriate Care, Surgical wounds (Stage 2 on bttocks, stage 1 on sacrum both with foam dressing; closed incision on chest)                       Personal Care Assistance Level of Assistance  Bathing, Dressing, Feeding Bathing Assistance: Limited assistance Feeding assistance: Limited assistance Dressing Assistance: Limited assistance     Functional Limitations Info             SPECIAL CARE FACTORS FREQUENCY  PT (By licensed PT), OT (By licensed OT)     PT Frequency: 5X per week OT Frequency: 5X per week            Contractures Contractures Info: Not present    Additional Factors Info  Insulin Sliding Scale, Allergies, Code Status Code Status Info: FULL Allergies Info: Chlordiazepoxide-clidinium,  Biaxin (Clarithromycin), Tramadol, Codeine   Insulin Sliding Scale Info: See DC summary       Current Medications (01/21/2020):  This is the current hospital active medication list Current Facility-Administered Medications  Medication Dose Route Frequency Provider Last Rate Last Admin   acetaminophen (TYLENOL) tablet 650 mg  650 mg Oral Q6H PRN Fuller Plan A, MD   650 mg  at 01/20/20 2200   Or   acetaminophen (TYLENOL) suppository 650 mg  650 mg Rectal Q6H PRN Norval Morton, MD       ALPRAZolam Duanne Moron) tablet 0.25 mg  0.25 mg Oral BID PRN Fuller Plan A, MD   0.25 mg at 01/21/20 8828   aspirin tablet 325 mg  325 mg Oral Daily Paula Zietz, Rondell A, MD   325 mg at 01/21/20 1019   cefTRIAXone (ROCEPHIN) 1 g in sodium chloride 0.9 % 100 mL IVPB  1 g Intravenous Q24H Regalado, Belkys A, MD 200 mL/hr at 01/21/20 0643 1 g at 01/21/20 0643   enoxaparin (LOVENOX) injection 40 mg  40 mg Subcutaneous Q24H Kaycie Pegues, Rondell A, MD   40 mg at 01/21/20 1018   hydrALAZINE (APRESOLINE) tablet 25 mg  25 mg Oral Q6H PRN Regalado, Belkys A, MD       insulin aspart (novoLOG) injection 0-20 Units  0-20 Units Subcutaneous TID WC Tamala Julian, Rondell A, MD   4 Units at 01/21/20 1212   insulin glargine (LANTUS) injection 20 Units  20 Units Subcutaneous Daily Fuller Plan A, MD   20 Units at 01/21/20 1022   irbesartan (AVAPRO) tablet 150 mg  150 mg Oral Daily Veleta Yamamoto, Rondell A, MD   150 mg at 01/21/20 1020   levalbuterol (XOPENEX) nebulizer solution 0.63 mg  0.63 mg Nebulization Q6H PRN Fuller Plan A, MD       lip balm (CARMEX) ointment   Topical PRN Suella Broad, FNP       MEDLINE mouth rinse  15 mL Mouth Rinse BID Tamala Julian, Rondell A, MD   15 mL at 01/21/20 1027   metoprolol succinate (TOPROL-XL) 24 hr tablet 50 mg  50 mg Oral Daily Cardama, Grayce Sessions, MD   50 mg at 01/21/20 1021   polyethylene glycol (MIRALAX / GLYCOLAX) packet 17 g  17 g Oral Daily Regalado, Belkys A, MD       pravastatin (PRAVACHOL) tablet 20 mg  20 mg Oral Daily Layten Aiken, Rondell A, MD   20 mg at 01/21/20 1019   sodium chloride flush (NS) 0.9 % injection 3 mL  3 mL Intravenous Once Cardama, Grayce Sessions, MD       sodium chloride flush (NS) 0.9 % injection 3 mL  3 mL Intravenous Q12H Quamaine Webb, Rondell A, MD   3 mL at 01/20/20 2201   sodium chloride HYPERTONIC 3 % nebulizer solution 4 mL  4 mL  Nebulization Q6H PRN Fuller Plan A, MD       Tiotropium Bromide-Olodaterol 2.5-2.5 MCG/ACT AERS 2 puff  2 puff Inhalation Daily Regalado, Belkys A, MD   2 puff at 01/21/20 0829   vitamin B-12 (CYANOCOBALAMIN) tablet 100 mcg  100 mcg Oral Daily Regalado, Belkys A, MD   100 mcg at 01/21/20 1021     Discharge Medications: Please see discharge summary for a list of discharge medications.  Relevant Imaging Results:  Relevant Lab Results:   Additional Information [id number]  Glennon Hamilton, Student-Social Work

## 2020-01-22 LAB — CBC
HCT: 40.9 % (ref 36.0–46.0)
Hemoglobin: 12.7 g/dL (ref 12.0–15.0)
MCH: 27.1 pg (ref 26.0–34.0)
MCHC: 31.1 g/dL (ref 30.0–36.0)
MCV: 87.4 fL (ref 80.0–100.0)
Platelets: 157 10*3/uL (ref 150–400)
RBC: 4.68 MIL/uL (ref 3.87–5.11)
RDW: 15.6 % — ABNORMAL HIGH (ref 11.5–15.5)
WBC: 8.6 10*3/uL (ref 4.0–10.5)
nRBC: 0 % (ref 0.0–0.2)

## 2020-01-22 LAB — GLUCOSE, CAPILLARY
Glucose-Capillary: 243 mg/dL — ABNORMAL HIGH (ref 70–99)
Glucose-Capillary: 224 mg/dL — ABNORMAL HIGH (ref 70–99)
Glucose-Capillary: 246 mg/dL — ABNORMAL HIGH (ref 70–99)

## 2020-01-22 LAB — BASIC METABOLIC PANEL
Anion gap: 9 (ref 5–15)
BUN: 8 mg/dL (ref 8–23)
CO2: 31 mmol/L (ref 22–32)
Calcium: 9.2 mg/dL (ref 8.9–10.3)
Chloride: 96 mmol/L — ABNORMAL LOW (ref 98–111)
Creatinine, Ser: 0.73 mg/dL (ref 0.44–1.00)
GFR calc Af Amer: >60 mL/min (ref >60)
GFR calc non Af Amer: >60 mL/min (ref >60)
Glucose, Bld: 255 mg/dL — ABNORMAL HIGH (ref 70–99)
Potassium: 4.4 mmol/L (ref 3.5–5.1)
Sodium: 136 mmol/L (ref 135–145)

## 2020-01-22 LAB — NOVEL CORONAVIRUS, NAA: SARS-CoV-2, NAA: NEGATIVE

## 2020-01-22 LAB — SARS CORONAVIRUS 2 BY RT PCR (HOSPITAL ORDER, PERFORMED IN ~~LOC~~ HOSPITAL LAB): SARS Coronavirus 2: NEGATIVE

## 2020-01-22 MED ORDER — IRBESARTAN 75 MG PO TABS
75.0000 mg | ORAL_TABLET | Freq: Every day | ORAL | Status: DC
  Administered 2020-01-22 – 2020-01-23 (×2): 75 mg via ORAL
  Filled 2020-01-22 (×2): qty 1

## 2020-01-22 NOTE — Progress Notes (Signed)
Inpatient Diabetes Program Recommendations  AACE/ADA: New Consensus Statement on Inpatient Glycemic Control (2015)  Target Ranges:  Prepandial:   less than 140 mg/dL      Peak postprandial:   less than 180 mg/dL (1-2 hours)      Critically ill patients:  140 - 180 mg/dL   Lab Results  Component Value Date   GLUCAP 243 (H) 01/22/2020   HGBA1C 9.8 (H) 01/14/2020    Review of Glycemic Control Results for CHELESA, WEINGARTNER (MRN [id number]) as of 01/22/2020 10:43  Ref. Range 01/21/2020 16:29 01/21/2020 21:36 01/22/2020 06:39  Glucose-Capillary Latest Ref Range: 70 - 99 mg/dL 211 (H) 240 (H) 243 (H)   Diabetes history:DM 2 Outpatient Diabetes medications: Novolog 14-16 units with breakfast and lunch and Novolog 12-16 units with supper Tresiba 58 units daily Current orders for Inpatient glycemic control: Novolog resistant tid with meals  Lantus 20 units QD  Inpatient Diabetes Program Recommendations:  Consider slight increase to Lantus to 25 units QD.   Thanks, Bronson Curb, MSN, RNC-OB Diabetes Coordinator [phone] (8a-5p)

## 2020-01-22 NOTE — Progress Notes (Signed)
Physical Therapy Treatment Patient Details Name: [name] MRN: [id number] DOB: [birth year] Today's Date: 01/22/2020    History of Present Illness 84 y.o. female presenting with confusion, hypoxia with O2 sat 70% on RA, and bradycardia with EKG (+) third-degree AV block. PMHx significant for HTN, HLD, DM w/ neuropathy, chronic hypoxemic respiratory failure 2/2 underlying COPD on 2.5-3L O2 at baseline, obesity, and anxiety.     PT Comments    The pt was in bed upon arrival of PT, agreeable to session with progression of OOB mobility and activity at this time. The pt was able to complete bed mobility and transfers without a drop in BP during this session, but continues to require assist of 2 for safety and stability. The pt reports significant anxiety regarding mobility, especially related to fear of falling, and therefore we discussed implications of this anxiety on safety with mobility. The pt is able to stand with less assist using a stedy device, and was able to complete sit-stand from the stedy flaps with minG only for safety. The pt will continue to benefit from skilled PT to progress functional strength, activity tolerance, and stability following d/c to reduce risk of falls prior to return home.    Follow Up Recommendations  SNF;Supervision/Assistance - 24 hour     Equipment Recommendations  None recommended by PT    Recommendations for Other Services       Precautions / Restrictions Precautions Precautions: Fall;ICD/Pacemaker Precaution Comments: s/p pacemaker 2 weeks ago Restrictions Weight Bearing Restrictions: No    Mobility  Bed Mobility Overal bed mobility: Needs Assistance Bed Mobility: Rolling;Sidelying to Sit Rolling: Mod assist Sidelying to sit: Mod assist       General bed mobility comments: Step-by-step cues for technique; modA to help LEs clear EOB and heavy mod assist to elevate trunk to sit and square off hips at EOB. pt pushing trunk posteriorly due  to reported fear of falling  Transfers Overall transfer level: Needs assistance Equipment used: Ambulation equipment used Transfers: Sit to/from Stand Sit to Stand: +2 physical assistance;Mod assist         General transfer comment: minA of 2 assist to power up to stand in stedy; minG to rise from steady flaps. multimodal cues to stand with hips fully extended  Ambulation/Gait             General Gait Details: deferred gait due to fatigue and elevated BP     Balance Overall balance assessment: Needs assistance Sitting-balance support: Feet supported;Bilateral upper extremity supported Sitting balance-Leahy Scale: Poor Sitting balance - Comments: pt requiring modA to maintain seated position due to reports of fear of falling. Postural control: Posterior lean   Standing balance-Leahy Scale: Zero Standing balance comment: relies on UE support and external support                            Cognition Arousal/Alertness: Awake/alert Behavior During Therapy: WFL for tasks assessed/performed;Anxious Overall Cognitive Status: No family/caregiver present to determine baseline cognitive functioning Area of Impairment: Orientation;Attention;Memory;Following commands;Safety/judgement;Awareness;Problem solving                 Orientation Level: Disoriented to;Time;Situation Current Attention Level: Sustained Memory: Decreased short-term memory;Decreased recall of precautions Following Commands: Follows one step commands consistently;Follows multi-step commands inconsistently Safety/Judgement: Decreased awareness of safety;Decreased awareness of deficits Awareness: Intellectual Problem Solving: Slow processing;Difficulty sequencing;Requires verbal cues;Requires tactile cues General Comments: very verbose and loves talking about her cat Medical laboratory scientific officer).  Seems aware of some cog deficits, but struggles to ask coherent questions at times without multiple attempts. Very poor  understanding of her health condition, possibly poor health literacy at baseline      Exercises      General Comments General comments (skin integrity, edema, etc.): BP 156/75 in supine, increased to 180s over 60 in sitting and standing at EOB, returned to 141/78 when seated in recliner at end of session. The pt reports feeling "drunk" and shaky through the session.      Pertinent Vitals/Pain Pain Assessment: No/denies pain Pain Intervention(s): Limited activity within patient's tolerance;Monitored during session           PT Goals (current goals can now be found in the care plan section) Acute Rehab PT Goals Patient Stated Goal: to go home to her cat PT Goal Formulation: With patient Time For Goal Achievement: 02/02/20 Potential to Achieve Goals: Fair Progress towards PT goals: Progressing toward goals    Frequency    Min 2X/week      PT Plan Frequency needs to be updated (to align with department protocols)    Co-evaluation   Reason for Co-Treatment: Necessary to address cognition/behavior during functional activity;For patient/therapist safety;To address functional/ADL transfers          AM-PAC PT "6 Clicks" Mobility   Outcome Measure  Help needed turning from your back to your side while in a flat bed without using bedrails?: A Little Help needed moving from lying on your back to sitting on the side of a flat bed without using bedrails?: A Lot Help needed moving to and from a bed to a chair (including a wheelchair)?: A Lot Help needed standing up from a chair using your arms (e.g., wheelchair or bedside chair)?: A Lot Help needed to walk in hospital room?: A Lot Help needed climbing 3-5 steps with a railing? : Total 6 Click Score: 12    End of Session Equipment Utilized During Treatment: Gait belt;Oxygen Activity Tolerance: Patient tolerated treatment well Patient left: in chair;with call bell/phone within reach;with chair alarm set Nurse Communication:  Mobility status;Other (comment) (elevated BP) PT Visit Diagnosis: Unsteadiness on feet (R26.81);Muscle weakness (generalized) (M62.81);Difficulty in walking, not elsewhere classified (R26.2);History of falling (Z91.81)     Time: 1127-1201 PT Time Calculation (min) (ACUTE ONLY): 34 min  Charges:  $Gait Training: 8-22 mins $Therapeutic Activity: 8-22 mins                     Karma Ganja, PT, DPT   Acute Rehabilitation Department Pager #: [phone]    Otho Bellows 01/22/2020, 1:24 PM

## 2020-01-22 NOTE — Plan of Care (Signed)
  Problem: Pain Managment: Goal: General experience of comfort will improve Outcome: Progressing   

## 2020-01-22 NOTE — Progress Notes (Signed)
PROGRESS NOTE    DIANELY KREHBIEL  MEB:[id number] DOB: 01/20/1936 DOA: 01/18/2020 PCP: Patient, No Pcp Per   Brief Narrative: 84/F with past medical history significant for hypertension, diabetes, neuropathy, chronic hypoxic respiratory failure on 2 on 3 L of oxygen at home, COPD, obesity, was brought to the emergency department after she was found hypoxic.  Per report her caregiver called [facility] who performed a  welfare check and found the patient sitting in chair with oxygen saturation 85% on room air, oxygen tank was empty. -Of note patient recently hospitalized from 9/7 until 9/10 for acute metabolic encephalopathy thought to be secondary to heart block and hypoxia with signs of Klebsiella UTI for she was treated with 3 days of Rocephin. -At baseline patient lives alone and has a care giver that comes and check on her. -Patient reports memory problems. -Evaluation in the ED patient was afebrile, urinalysis was abnormal, oxygen saturation maintained on 4 L of oxygen.  High sensitive troponin 68/71 D-dimer 3.7.  CT angio chest negative for PE.   Assessment & Plan:  Acute metabolic encephalopathy; -In the setting of hypoxemia and possibly UTI -Does have memory loss, early dementia at baseline -Stabilized with supplemental oxygen and treatment of UTI -Imaging unremarkable, CT head notes atrophy -PT OT eval completed, SNF recommended -Patient has no immediate family, has a friend who has been helping her make decisions, evaluated by psych who felt patient did not have capacity -Discharged to SNF for short-term rehab  Suspected UTI;  -UA with 21-50 WBC, many bacteria -Urine culture with multiple species, day 3 of ceftriaxone today -Discontinue antibiotics today  Acute on chronic respiratory failure.  Ran out of oxygen.  -Stable and improved with supplemental O2 which she is supposed to be on at baseline for COPD  Mild thrombocytopenia; Follow trend  Elevated D  dimer; CT angio Negative.   Elevated troponin; Demand ischemia, due to hypoxemia. -No symptoms  Complete Heart block S/P pacemaker: Pacemaker place 01/14/2020.  Diabetes;Mellitus type 2;  Continue with sliding scale insulin. Continue with Lantus.  HTN; Continue with avapro and metoprolol  Anxiety: With Xanax as needed.  HLD; Continue with pravastatin  Obesity: BMI 30  COPD: Continue with inhaler  Constipation;  -Continue laxatives   Pressure Injury 01/13/20 Sacrum Medial Stage 1 -  Intact skin with non-blanchable redness of a localized area usually over a bony prominence. (Active)  01/13/20 2046  Location: Sacrum  Location Orientation: Medial  Staging: Stage 1 -  Intact skin with non-blanchable redness of a localized area usually over a bony prominence.  Wound Description (Comments):   Present on Admission: Yes     Pressure Injury 01/19/20 Buttocks Stage 2 -  Partial thickness loss of dermis presenting as a shallow open injury with a red, pink wound bed without slough. redness (Active)  01/19/20 1400  Location: Buttocks  Location Orientation:   Staging: Stage 2 -  Partial thickness loss of dermis presenting as a shallow open injury with a red, pink wound bed without slough.  Wound Description (Comments): redness  Present on Admission: Yes    DVT prophylaxis: Lovenox Code Status: Full code Family Communication: No family at bedside, patient said that she does not have any family  Disposition Plan:  Status is: Inpatient  Remains inpatient appropriate because:Ongoing diagnostic testing needed not appropriate for outpatient work up   Dispo: The patient is from: Home              Anticipated d/c is  to: SNF              Anticipated d/c date is: 9/17              Patient currently is medically stable to d/c.     Consultants:   Psych  Procedures:   none  Antimicrobials:   Ceftriaxone  Subjective: -No events overnight, intermittent cognitive deficits  noted  Objective: Vitals:   01/21/20 1629 01/21/20 2051 01/22/20 0542 01/22/20 0932  BP: (!) 139/54 (!) 162/60 123/66 (!) 156/65  Pulse: 83 77 70 90  Resp: 18 18 16 18   Temp: 98.7 F (37.1 C) 98.4 F (36.9 C) 98.7 F (37.1 C) 98 F (36.7 C)  TempSrc: Oral Oral Oral Oral  SpO2: 98% 95% 94% 93%  Weight:      Height:        Intake/Output Summary (Last 24 hours) at 01/22/2020 1344 Last data filed at 01/22/2020 0900 Gross per 24 hour  Intake 1050 ml  Output 1800 ml  Net -750 ml   Filed Weights   01/19/20 0010 01/19/20 1945 01/20/20 2053  Weight: 77.1 kg 81.1 kg 81.1 kg    Examination:  General exam: Elderly pleasant female sitting up in bed, awake alert oriented to self, place and partly to time, mild cognitive deficits noted HEENT: No JVD CVS: S1-S2, regular rate rhythm Lungs: Clear bilaterally Abdomen: Soft, nontender, bowel sounds present Extremities: No edema  Neuro: Moves all extremities, no localizing signs    Data Reviewed: I have personally reviewed following labs and imaging studies  CBC: Recent Labs  Lab 01/18/20 1727 01/20/20 0625 01/22/20 0425  WBC 11.9* 9.1 8.6  HGB 13.9 13.2 12.7  HCT 46.1* 44.1 40.9  MCV 86.8 87.8 87.4  PLT 206 140* 680   Basic Metabolic Panel: Recent Labs  Lab 01/18/20 1727 01/20/20 0625 01/22/20 0425  NA 136 137 136  K 4.0 4.3 4.4  CL 94* 97* 96*  CO2 28 30 31   GLUCOSE 250* 199* 255*  BUN 8 8 8   CREATININE 0.76 0.61 0.73  CALCIUM 8.9 8.9 9.2   GFR: Estimated Creatinine Clearance: 54.7 mL/min (by C-G formula based on SCr of 0.73 mg/dL). Liver Function Tests: No results for input(s): AST, ALT, ALKPHOS, BILITOT, PROT, ALBUMIN in the last 168 hours. No results for input(s): LIPASE, AMYLASE in the last 168 hours. No results for input(s): AMMONIA in the last 168 hours. Coagulation Profile: No results for input(s): INR, PROTIME in the last 168 hours. Cardiac Enzymes: No results for input(s): CKTOTAL, CKMB, CKMBINDEX,  TROPONINI in the last 168 hours. BNP (last 3 results) No results for input(s): PROBNP in the last 8760 hours. HbA1C: No results for input(s): HGBA1C in the last 72 hours. CBG: Recent Labs  Lab 01/21/20 1125 01/21/20 1629 01/21/20 2136 01/22/20 0639 01/22/20 1114  GLUCAP 180* 211* 240* 243* 224*   Lipid Profile: No results for input(s): CHOL, HDL, LDLCALC, TRIG, CHOLHDL, LDLDIRECT in the last 72 hours. Thyroid Function Tests: No results for input(s): TSH, T4TOTAL, FREET4, T3FREE, THYROIDAB in the last 72 hours. Anemia Panel: Recent Labs    01/20/20 0832  VITAMINB12 255   Sepsis Labs: No results for input(s): PROCALCITON, LATICACIDVEN in the last 168 hours.  Recent Results (from the past 240 hour(s))  Urine culture     Status: Abnormal   Collection Time: 01/13/20 12:42 PM   Specimen: Urine, Catheterized  Result Value Ref Range Status   Specimen Description URINE, CATHETERIZED  Final   Special  Requests NONE  Final   Culture (A)  Final    >=100,000 COLONIES/mL KLEBSIELLA PNEUMONIAE >=100,000 COLONIES/mL LACTOBACILLUS SPECIES Standardized susceptibility testing for this organism is not available. Performed at [facility], [place] [address] [place], [place] [address]    Report Status 01/16/2020 FINAL  Final   Organism ID, Bacteria KLEBSIELLA PNEUMONIAE (A)  Final      Susceptibility   Klebsiella pneumoniae - MIC*    AMPICILLIN >=32 RESISTANT Resistant     CEFAZOLIN <=4 SENSITIVE Sensitive     CEFTRIAXONE <=0.25 SENSITIVE Sensitive     CIPROFLOXACIN <=0.25 SENSITIVE Sensitive     GENTAMICIN <=1 SENSITIVE Sensitive     IMIPENEM <=0.25 SENSITIVE Sensitive     NITROFURANTOIN 128 RESISTANT Resistant     TRIMETH/SULFA <=20 SENSITIVE Sensitive     AMPICILLIN/SULBACTAM 8 SENSITIVE Sensitive     * >=100,000 COLONIES/mL KLEBSIELLA PNEUMONIAE  SARS Coronavirus 2 by RT PCR (hospital order, performed in [place] hospital lab) Nasopharyngeal Nasopharyngeal Swab     Status:  None   Collection Time: 01/13/20  1:22 PM   Specimen: Nasopharyngeal Swab  Result Value Ref Range Status   SARS Coronavirus 2 NEGATIVE NEGATIVE Final    Comment: (NOTE) SARS-CoV-2 target nucleic acids are NOT DETECTED.  The SARS-CoV-2 RNA is generally detectable in upper and lower respiratory specimens during the acute phase of infection. The lowest concentration of SARS-CoV-2 viral copies this assay can detect is 250 copies / mL. A negative result does not preclude SARS-CoV-2 infection and should not be used as the sole basis for treatment or other patient management decisions.  A negative result may occur with improper specimen collection / handling, submission of specimen other than nasopharyngeal swab, presence of viral mutation(s) within the areas targeted by this assay, and inadequate number of viral copies (<250 copies / mL). A negative result must be combined with clinical observations, patient history, and epidemiological information.  Fact Sheet for Patients:   StrictlyIdeas.no  Fact Sheet for Healthcare Providers: BankingDealers.co.za  This test is not yet approved or  cleared by the [place] FDA and has been authorized for detection and/or diagnosis of SARS-CoV-2 by FDA under an Emergency Use Authorization (EUA).  This EUA will remain in effect (meaning this test can be used) for the duration of the COVID-19 declaration under Section 564(b)(1) of the Act, 21 U.S.C. section 360bbb-3(b)(1), unless the authorization is terminated or revoked sooner.  Performed at [facility], [place] [address], [place], [place] [address]   Culture, blood (routine x 2)     Status: None   Collection Time: 01/13/20  4:03 PM   Specimen: BLOOD  Result Value Ref Range Status   Specimen Description BLOOD SITE NOT SPECIFIED  Final   Special Requests   Final    BOTTLES DRAWN AEROBIC AND ANAEROBIC Blood Culture results may not be optimal  due to an inadequate volume of blood received in culture bottles   Culture   Final    NO GROWTH 5 DAYS Performed at [facility], [place] [address], [place], [place] [address]    Report Status 01/18/2020 FINAL  Final  Culture, blood (routine x 2)     Status: None   Collection Time: 01/13/20  4:04 PM   Specimen: BLOOD  Result Value Ref Range Status   Specimen Description BLOOD SITE NOT SPECIFIED  Final   Special Requests   Final    BOTTLES DRAWN AEROBIC ONLY Blood Culture results may not  be optimal due to an inadequate volume of blood received in culture bottles   Culture   Final    NO GROWTH 5 DAYS Performed at [facility], [place] [address] [place], [place] [address]    Report Status 01/18/2020 FINAL  Final  Surgical PCR screen     Status: None   Collection Time: 01/14/20  2:39 PM   Specimen: Nasal Mucosa; Nasal Swab  Result Value Ref Range Status   MRSA, PCR NEGATIVE NEGATIVE Final   Staphylococcus aureus NEGATIVE NEGATIVE Final    Comment: (NOTE) The Xpert SA Assay (FDA approved for NASAL specimens in patients 73 years of age and older), is one component of a comprehensive surveillance program. It is not intended to diagnose infection nor to guide or monitor treatment. Performed at [facility], [place] [address] [place], [place] [address]   Culture, Urine     Status: Abnormal   Collection Time: 01/19/20  8:39 AM   Specimen: Urine, Random  Result Value Ref Range Status   Specimen Description URINE, RANDOM  Final   Special Requests   Final    NONE Performed at [facility], [place] [address], [place], [place] [address]    Culture MULTIPLE SPECIES PRESENT, SUGGEST RECOLLECTION (A)  Final   Report Status 01/20/2020 FINAL  Final         Radiology Studies: DG Abd 1 View  Result Date: 01/20/2020 CLINICAL DATA:  Constipation, right-sided pain. History of cholecystectomy and appendectomy. EXAM: ABDOMEN - 1 VIEW supine. Right flank and pelvis is collimated  off view. COMPARISON:  X-ray abdomen 01/25/2017 FINDINGS: The bowel gas pattern is normal. No radio-opaque calculi or other significant radiographic abnormality are seen. Right upper quadrant surgical clips again noted. Partially visualized leads overlying the lower hemithorax are likely related to a cardiac pacemaker. No acute osseous abnormality. IMPRESSION: Nonobstructive bowel gas pattern with the right flank and pelvis collimated off view. Electronically Signed   By: Iven Finn M.D.   On: 01/20/2020 18:02        Scheduled Meds: . aspirin  325 mg Oral Daily  . enoxaparin (LOVENOX) injection  40 mg Subcutaneous Q24H  . insulin aspart  0-20 Units Subcutaneous TID WC  . insulin glargine  20 Units Subcutaneous Daily  . irbesartan  75 mg Oral Daily  . mouth rinse  15 mL Mouth Rinse BID  . metoprolol succinate  50 mg Oral Daily  . polyethylene glycol  17 g Oral Daily  . pravastatin  20 mg Oral Daily  . sodium chloride flush  3 mL Intravenous Once  . sodium chloride flush  3 mL Intravenous Q12H  . Tiotropium Bromide-Olodaterol  2 puff Inhalation Daily  . vitamin B-12  100 mcg Oral Daily   Continuous Infusions: . cefTRIAXone (ROCEPHIN)  IV 1 g (01/22/20 0545)     LOS: 3 days    Time spent: 25 minutes  Domenic Polite, MD [facility]  01/22/2020, 1:44 PM

## 2020-01-22 NOTE — TOC Progression Note (Addendum)
Transition of Care [facility]) - Progression Note    Patient Details  Name: [name] MRN: [id number] Date of Birth: 03-03-1936  Transition of Care [facility]) CM/SW [place], LCSW Phone Number: 01/22/2020, 12:49 PM  Clinical Narrative:    12:49pm-CSW spoke with patient who was very pleasant and talkative at bedside and provided SNF offers. She has selected [facility] and they do have a bed available for her. She has not been vaccinated for COVID and is asking if she can get her vaccine here before she discharges. CSW will consult with MD to see if she is medically eligible. Patient is aware that she will go into a 14 day quarantine. She reported that we can let her friend, Freda Munro, know of the plan once everything is solidified.   2:20pm-CSW made patient aware that MD said she can receive the COVID vaccine. COVID vaccine team will provide the vaccine tomorrow. Patient expressed that she is not ready to discharge tomorrow. CSW alerted her that it is up to the MD on when she is medically ready. She reported understanding and will speak with her friend, Freda Munro.    Expected Discharge Plan: [place] Barriers to Discharge: SNF Pending bed offer, Continued Medical Work up  Expected Discharge Plan and Services Expected Discharge Plan: [place] arrangements for the past 2 months: Apartment                                       Social Determinants of Health (SDOH) Interventions    Readmission Risk Interventions No flowsheet data found.

## 2020-01-23 ENCOUNTER — Inpatient Hospital Stay: Payer: Medicare Other

## 2020-01-23 DIAGNOSIS — R41841 Cognitive communication deficit: Secondary | ICD-10-CM | POA: Diagnosis not present

## 2020-01-23 DIAGNOSIS — G9341 Metabolic encephalopathy: Secondary | ICD-10-CM | POA: Diagnosis not present

## 2020-01-23 DIAGNOSIS — Z7401 Bed confinement status: Secondary | ICD-10-CM | POA: Diagnosis not present

## 2020-01-23 DIAGNOSIS — M4180 Other forms of scoliosis, site unspecified: Secondary | ICD-10-CM | POA: Diagnosis not present

## 2020-01-23 DIAGNOSIS — E1149 Type 2 diabetes mellitus with other diabetic neurological complication: Secondary | ICD-10-CM | POA: Diagnosis not present

## 2020-01-23 DIAGNOSIS — J069 Acute upper respiratory infection, unspecified: Secondary | ICD-10-CM | POA: Diagnosis not present

## 2020-01-23 DIAGNOSIS — M48061 Spinal stenosis, lumbar region without neurogenic claudication: Secondary | ICD-10-CM | POA: Diagnosis not present

## 2020-01-23 DIAGNOSIS — R2689 Other abnormalities of gait and mobility: Secondary | ICD-10-CM | POA: Diagnosis not present

## 2020-01-23 DIAGNOSIS — I10 Essential (primary) hypertension: Secondary | ICD-10-CM | POA: Diagnosis not present

## 2020-01-23 DIAGNOSIS — Z789 Other specified health status: Secondary | ICD-10-CM | POA: Diagnosis not present

## 2020-01-23 DIAGNOSIS — M6281 Muscle weakness (generalized): Secondary | ICD-10-CM | POA: Diagnosis not present

## 2020-01-23 DIAGNOSIS — R2681 Unsteadiness on feet: Secondary | ICD-10-CM | POA: Diagnosis not present

## 2020-01-23 DIAGNOSIS — I959 Hypotension, unspecified: Secondary | ICD-10-CM | POA: Diagnosis not present

## 2020-01-23 DIAGNOSIS — B9689 Other specified bacterial agents as the cause of diseases classified elsewhere: Secondary | ICD-10-CM | POA: Diagnosis not present

## 2020-01-23 DIAGNOSIS — I69828 Other speech and language deficits following other cerebrovascular disease: Secondary | ICD-10-CM | POA: Diagnosis not present

## 2020-01-23 DIAGNOSIS — E1142 Type 2 diabetes mellitus with diabetic polyneuropathy: Secondary | ICD-10-CM | POA: Diagnosis not present

## 2020-01-23 DIAGNOSIS — J9621 Acute and chronic respiratory failure with hypoxia: Secondary | ICD-10-CM | POA: Diagnosis not present

## 2020-01-23 DIAGNOSIS — F419 Anxiety disorder, unspecified: Secondary | ICD-10-CM | POA: Diagnosis not present

## 2020-01-23 DIAGNOSIS — M2550 Pain in unspecified joint: Secondary | ICD-10-CM | POA: Diagnosis not present

## 2020-01-23 DIAGNOSIS — Z950 Presence of cardiac pacemaker: Secondary | ICD-10-CM | POA: Diagnosis not present

## 2020-01-23 DIAGNOSIS — J449 Chronic obstructive pulmonary disease, unspecified: Secondary | ICD-10-CM | POA: Diagnosis not present

## 2020-01-23 DIAGNOSIS — K219 Gastro-esophageal reflux disease without esophagitis: Secondary | ICD-10-CM | POA: Diagnosis not present

## 2020-01-23 DIAGNOSIS — I442 Atrioventricular block, complete: Secondary | ICD-10-CM | POA: Diagnosis not present

## 2020-01-23 DIAGNOSIS — N390 Urinary tract infection, site not specified: Secondary | ICD-10-CM | POA: Diagnosis not present

## 2020-01-23 DIAGNOSIS — Z23 Encounter for immunization: Secondary | ICD-10-CM

## 2020-01-23 DIAGNOSIS — G3184 Mild cognitive impairment, so stated: Secondary | ICD-10-CM | POA: Diagnosis not present

## 2020-01-23 DIAGNOSIS — B372 Candidiasis of skin and nail: Secondary | ICD-10-CM | POA: Diagnosis not present

## 2020-01-23 DIAGNOSIS — R5381 Other malaise: Secondary | ICD-10-CM | POA: Diagnosis not present

## 2020-01-23 DIAGNOSIS — E782 Mixed hyperlipidemia: Secondary | ICD-10-CM | POA: Diagnosis not present

## 2020-01-23 DIAGNOSIS — J9611 Chronic respiratory failure with hypoxia: Secondary | ICD-10-CM | POA: Diagnosis not present

## 2020-01-23 DIAGNOSIS — D696 Thrombocytopenia, unspecified: Secondary | ICD-10-CM | POA: Diagnosis not present

## 2020-01-23 LAB — GLUCOSE, CAPILLARY
Glucose-Capillary: 266 mg/dL — ABNORMAL HIGH (ref 70–99)
Glucose-Capillary: 247 mg/dL — ABNORMAL HIGH (ref 70–99)

## 2020-01-23 MED ORDER — POLYETHYLENE GLYCOL 3350 17 G PO PACK: 17.0000 g | PACK | Freq: Every day | ORAL | 0 refills | Status: DC | PRN

## 2020-01-23 MED ORDER — ACETAMINOPHEN 325 MG PO TABS: 650.0000 mg | ORAL_TABLET | Freq: Four times a day (QID) | ORAL | Status: AC | PRN

## 2020-01-23 MED ORDER — TRESIBA FLEXTOUCH 200 UNIT/ML ~~LOC~~ SOPN: 30.0000 [IU] | PEN_INJECTOR | Freq: Every day | SUBCUTANEOUS | Status: DC

## 2020-01-23 MED ORDER — ALPRAZOLAM 0.5 MG PO TABS: 0.2500 mg | ORAL_TABLET | ORAL | 0 refills | Status: DC | PRN

## 2020-01-23 MED ORDER — INSULIN GLARGINE 100 UNIT/ML ~~LOC~~ SOLN
24.0000 [IU] | Freq: Every day | SUBCUTANEOUS | Status: DC
  Administered 2020-01-23: 24 [IU] via SUBCUTANEOUS
  Filled 2020-01-23: qty 0.24

## 2020-01-23 MED ORDER — IRBESARTAN 75 MG PO TABS: 75.0000 mg | ORAL_TABLET | Freq: Every day | ORAL | Status: DC

## 2020-01-23 NOTE — Progress Notes (Signed)
[place] to give report, pt is ready for DC.

## 2020-01-23 NOTE — Progress Notes (Signed)
° °  Covid-19 Vaccination Clinic  Name:  COURTENY EGLER    MRN: [id number] DOB: 1935/07/05  01/23/2020  Ms. Pangborn was observed post Covid-19 immunization for 15 minutes without incident. She was provided with Vaccine Information Sheet and instruction to access the V-Safe system.   Ms. Hetz was instructed to call 911 with any severe reactions post vaccine:  Difficulty breathing   Swelling of face and throat   A fast heartbeat   A bad rash all over body   Dizziness and weakness   Immunizations Administered    Name Date Dose VIS Date Route   JANSSEN COVID-19 VACCINE 01/23/2020 12:06 PM 0.5 mL 07/05/2019 Intramuscular   Manufacturer: [employer]   Lot: [id number]   [place]: [id number]

## 2020-01-23 NOTE — Consult Note (Signed)
   [facility] Inpatient Consult   01/23/2020  YASHEKA FOSSETT [birth year] [id number]   [place] [facility] [ACO] Patient:  Medicare NextGen   Patient screened for less than 7 days readmission hospitalizations to check if potential [place] Management service needs.  Review of patient's medical record reveals patient is to transition to a skilled nursing facility at [facility].  Primary Care Provider: No current PCP, see notes below.  Spoke at length with patient regarding post hospital and facility support needs.  She states that Dr. Dwyane Dee was her PCP for 25 years and recently got a letter that he would no longer be her primary but her endocrinologist.  Spoke at length about Freda Munro her friend and her Hospital doctor.   Plan:  Will have [facility] RN PAC to follow up as patient has minimal support and update on findings.   For questions contact:   Natividad Brood, RN BSN [facility] Liaison  [phone] business mobile phone Toll free office [phone]  Fax number: [phone] [email or web address]

## 2020-01-23 NOTE — Progress Notes (Signed)
Pt DC to [facility], AVS & prescriptions in DC envelope, belongings sent with pt. Pt denies any pain. IV removed, skin CDI.

## 2020-01-23 NOTE — Discharge Summary (Addendum)
Physician Discharge Summary  [name]:[id number] DOB: 01/22/1936 DOA: 01/18/2020  PCP: Patient, No Pcp Per  Admit date: 01/18/2020 Discharge date: 01/23/2020  Time spent: 35 minutes  Recommendations for Outpatient Follow-up:  1. PCP in 1 week 2. Recommend further CODE STATUS and goals of care discussions   Discharge Diagnoses:  Principal Problem:   Acute metabolic encephalopathy Mild cognitive deficits   COPD mixed type ([place])   Type II diabetes mellitus, uncontrolled ([place])   Hypertension   Acute on chronic respiratory failure with hypoxia (HCC)   Elevated troponin   UTI (urinary tract infection)   Status post placement of cardiac pacemaker   Leukocytosis   Discharge Condition: Stable Diet recommendation: Diabetic, low-sodium  Filed Weights   01/19/20 1945 01/20/20 2053 01/22/20 2127  Weight: 81.1 kg 81.1 kg 76 kg    History of present illness:  84/F with past medical history significant for hypertension, diabetes, neuropathy, chronic hypoxic respiratory failure on 2 on 3 L of oxygen at home, COPD, obesity, was brought to the emergency department after she was found hypoxic.  Per report her caregiver called [facility] Department who performed a  welfare check and found the patient sitting in chair with oxygen saturation 85% on room air, oxygen tank was empty. -Of note patient recently hospitalized from 9/7 until 9/10 for acute metabolic encephalopathy thought to be secondary to heart block and hypoxia with signs of Klebsiella UTI for she was treated with 3 days of Rocephin. -At baseline patient lives alone and has a care giver that comes and check on her. -Patient reports memory problems. -Evaluation in the ED patient was afebrile, urinalysis was abnormal, oxygen saturation maintained on 4 L of oxygen.  High sensitive troponin 68/71 D-dimer 3.7.  CT angio chest negative for PE.   Hospital Course:   Acute metabolic encephalopathy; -In the setting of  hypoxemia and suspected UTI -Does have memory loss, early dementia at baseline -Stabilized with supplemental oxygen and treatment of UTI -Imaging unremarkable, CT head notes atrophy -PT OT eval completed, SNF recommended -Patient has no immediate family, has a friend Freda Munro who has been helping her make decisions, evaluated by psych who felt patient did not have capacity at this time, this can be reassessed again with down the road -Discharged to SNF for short-term rehab  Suspected UTI;  -UA with 21-50 WBC, many bacteria -Urine culture with multiple species,  -Treated with IV ceftriaxone for 3 days, antibiotics discontinued now  Acute on chronic respiratory failure/COPD  -Has chronic respiratory failure on 2 to 3 L O2 at baseline Ran out of oxygen in her apartment when she was found by [employer] -Stable and improved with supplemental O2 which she is supposed to be on at baseline for COPD  Mild thrombocytopenia; Follow trend  Elevated D dimer; CT angio Negative.   Elevated troponin; Demand ischemia, due to hypoxemia. -No symptoms  Complete Heart block S/P pacemaker: Pacemaker placed during recent admission 01/14/2020. -Follow-up with EP  Diabetes;Mellitus type 2;  -Tresiba resumed at discharge   HTN; Continue with avapro and metoprolol  Anxiety: -Continued on home regimen of Xanax as needed.  HLD; Continue pravastatin  Obesity: BMI 30  Constipation;  -Continue laxatives   Pressure Injury 01/13/20 Sacrum Medial Stage 1 -  Intact skin with non-blanchable redness of a localized area usually over a bony prominence. (Active)  01/13/20 2046  Location: Sacrum  Location Orientation: Medial  Staging: Stage 1 -  Intact skin with non-blanchable redness of a localized  area usually over a bony prominence.  Wound Description (Comments):   Present on Admission: Yes     Pressure Injury 01/19/20 Buttocks Stage 2 -  Partial thickness loss of dermis presenting as a shallow  open injury with a red, pink wound bed without slough. redness (Active)  01/19/20 1400  Location: Buttocks  Location Orientation:   Staging: Stage 2 -  Partial thickness loss of dermis presenting as a shallow open injury with a red, pink wound bed without slough.  Wound Description (Comments): redness  Present on Admission: Yes     Discharge Exam: Vitals:   01/23/20 0519 01/23/20 0828  BP: (!) 153/54 (!) 153/57  Pulse: 83 97  Resp: 16 18  Temp: 98 F (36.7 C) 97.7 F (36.5 C)  SpO2: 97% 96%    General: Awake alert oriented, to self, place and partly to time, mild memory deficits noted Cardiovascular: S1/S2, regular rate rhythm Respiratory: Clear  Discharge Instructions   Discharge Instructions    Diet - low sodium heart healthy   Complete by: As directed    Diet Carb Modified   Complete by: As directed    Discharge wound care:   Complete by: As directed    As above   Increase activity slowly   Complete by: As directed    No wound care   Complete by: As directed      Allergies as of 01/23/2020      Reactions   Chlordiazepoxide-clidinium Other (See Comments)   Sleepy,weak   Biaxin [clarithromycin] Other (See Comments)   Foul taste, abd pain, diarrhea   Tramadol Other (See Comments)   Caused tremors   Codeine Other (See Comments)   REACTION: gi upset- only in high doses per pt.  Can tolerate cough syrup with codeine.       Medication List    STOP taking these medications   NovoLOG FlexPen 100 UNIT/ML FlexPen Generic drug: insulin aspart     TAKE these medications   Accu-Chek Aviva Plus test strip Generic drug: glucose blood Use as instructed to check blood sugar 4 times a day. DX:E11.65   Accu-Chek Softclix Lancet Dev Kit 1 each by Does not apply route daily. Use as instructed to check blood sugar 8 times daily.   Accu-Chek Softclix Lancets lancets 1 each by Other route daily. Use as instructed to check blood sugar 8 times daily.   acetaminophen  325 MG tablet Commonly known as: TYLENOL Take 2 tablets (650 mg total) by mouth every 6 (six) hours as needed for mild pain (or Fever >/= 101).   AeroChamber MV inhaler Use as instructed   ALPRAZolam 0.5 MG tablet Commonly known as: XANAX Take 0.5 tablets (0.25 mg total) by mouth as needed for anxiety or sleep. What changed: See the new instructions.   aspirin 325 MG tablet Take 325 mg by mouth daily.   Breztri Aerosphere 160-9-4.8 MCG/ACT Aero Generic drug: Budeson-Glycopyrrol-Formoterol Inhale 2 puffs into the lungs 2 (two) times daily.   Insulin Pen Needle 31G X 8 MM Misc Commonly known as: B-D ULTRAFINE III SHORT PEN TEST 3 TIMES A DAY   B-D UF III MINI PEN NEEDLES 31G X 5 MM Misc Generic drug: Insulin Pen Needle USE 5 PER DAY TO INJECT INSULIN   Insulin Syringe-Needle U-100 31G X 15/64" 0.5 ML Misc Commonly known as: BD Insulin Syringe Ultrafine Use 2 per day dx code E11.65   ipratropium-albuterol 0.5-2.5 (3) MG/3ML Soln Commonly known as: DUONEB 1 vial in neb  every 8 hours and as needed What changed:   how much to take  how to take this  when to take this  reasons to take this  additional instructions   irbesartan 75 MG tablet Commonly known as: Avapro Take 1 tablet (75 mg total) by mouth daily. What changed:   medication strength  how much to take   levalbuterol 45 MCG/ACT inhaler Commonly known as: XOPENEX HFA Inhale 1-2 puffs into the lungs every 6 (six) hours as needed for wheezing.   magic mouthwash Soln Swish with 2 teaspoonfuls by mouth as needed   metoprolol succinate 50 MG 24 hr tablet Commonly known as: TOPROL-XL Take 1 tablet (50 mg total) by mouth daily. Take with or immediately following a meal.   omeprazole 20 MG capsule Commonly known as: PRILOSEC TAKE (1) CAPSULE DAILY. What changed: See the new instructions.   OXYGEN Inhale 3 L into the lungs continuous.   polyethylene glycol 17 g packet Commonly known as: MIRALAX /  GLYCOLAX Take 17 g by mouth daily as needed.   pravastatin 20 MG tablet Commonly known as: PRAVACHOL Take 1 tablet (20 mg total) by mouth daily.   sodium chloride HYPERTONIC 3 % nebulizer solution 1 ampule neb every 6 hours if needed to clear mucus   Stiolto Respimat 2.5-2.5 MCG/ACT Aers Generic drug: Tiotropium Bromide-Olodaterol USE 2 PUFFS DAILY What changed: See the new instructions.   Tyler Aas FlexTouch 200 UNIT/ML FlexTouch Pen Generic drug: insulin degludec Inject 30 Units into the skin daily. What changed: how much to take            Discharge Care Instructions  (From admission, onward)         Start     Ordered   01/23/20 0000  Discharge wound care:       Comments: As above   01/23/20 0954         Allergies  Allergen Reactions  . Chlordiazepoxide-Clidinium Other (See Comments)    Sleepy,weak  . Biaxin [Clarithromycin] Other (See Comments)    Foul taste, abd pain, diarrhea  . Tramadol Other (See Comments)    Caused tremors  . Codeine Other (See Comments)    REACTION: gi upset- only in high doses per pt.  Can tolerate cough syrup with codeine.     Contact information for after-discharge care    Destination    [facility] Preferred SNF .   Service: Skilled Nursing Contact information: [id number] [place] [phone]                   The results of significant diagnostics from this hospitalization (including imaging, microbiology, ancillary and laboratory) are listed below for reference.    Significant Diagnostic Studies: DG Chest 2 View  Result Date: 01/19/2020 CLINICAL DATA:  Generalized weakness. EXAM: CHEST - 2 VIEW COMPARISON:  January 15, 2020 FINDINGS: A dual lead AICD is noted. There is no evidence of acute infiltrate, pleural effusion or pneumothorax. The cardiac silhouette is mildly enlarged and unchanged in size. Marked severity calcification of the aortic arch is noted. The  visualized skeletal structures are unremarkable. IMPRESSION: No active cardiopulmonary disease. Electronically Signed   By: Virgina Norfolk M.D.   On: 01/19/2020 01:15   DG Chest 2 View  Result Date: 01/15/2020 CLINICAL DATA:  Pacemaker. EXAM: CHEST - 2 VIEW COMPARISON:  01/13/2020. FINDINGS: Cardiac pacer with lead tips over the right atrium and right ventricle. Borderline cardiomegaly and pulmonary venous  congestion. No evidence of pulmonary edema. Bibasilar atelectasis. Tiny right pleural effusion cannot be excluded. Surgical clips noted over the lower neck. IMPRESSION: 1. Cardiac pacer with lead tips over the right atrium right ventricle. Borderline cardiomegaly and pulmonary venous congestion. No evidence of pulmonary edema. 2. Bibasilar atelectasis. Tiny right pleural effusion cannot be excluded. Electronically Signed   By: Marcello Moores  Register   On: 01/15/2020 06:00   DG Abd 1 View  Result Date: 01/20/2020 CLINICAL DATA:  Constipation, right-sided pain. History of cholecystectomy and appendectomy. EXAM: ABDOMEN - 1 VIEW supine. Right flank and pelvis is collimated off view. COMPARISON:  X-ray abdomen 01/25/2017 FINDINGS: The bowel gas pattern is normal. No radio-opaque calculi or other significant radiographic abnormality are seen. Right upper quadrant surgical clips again noted. Partially visualized leads overlying the lower hemithorax are likely related to a cardiac pacemaker. No acute osseous abnormality. IMPRESSION: Nonobstructive bowel gas pattern with the right flank and pelvis collimated off view. Electronically Signed   By: Iven Finn M.D.   On: 01/20/2020 18:02   CT Head Wo Contrast  Result Date: 01/13/2020 CLINICAL DATA:  Altered mental status EXAM: CT HEAD WITHOUT CONTRAST TECHNIQUE: Contiguous axial images were obtained from the base of the skull through the vertex without intravenous contrast. COMPARISON:  May 11, 2013 FINDINGS: Brain: Mild diffuse atrophy is stable. There is no  intracranial mass, hemorrhage, extra-axial fluid collection, or midline shift. There is small vessel disease in the centra semiovale bilaterally, stable. There is a prior small lacunar infarct in the head of the caudate nucleus on the right. No acute appearing infarct is demonstrable. Vascular: No appreciable hyperdense vessel. There is calcification in each carotid siphon region. Skull: The bony calvarium appears intact. Sinuses/Orbits: There is mucosal thickening in several ethmoid air cells. Orbits appear symmetric bilaterally. Evidence of previous cataract removals. Other: Visualized mastoid air cells are clear. IMPRESSION: Stable atrophy with periventricular small vessel disease. Prior small infarct in the head of the caudate nucleus on the right. No acute infarct. No mass or hemorrhage. Foci of arterial vascular calcification noted. There is mucosal thickening in several ethmoid air cells. Electronically Signed   By: Lowella Grip III M.D.   On: 01/13/2020 13:54   CT Angio Chest PE W and/or Wo Contrast  Result Date: 01/19/2020 CLINICAL DATA:  Low to intermediate probability for pulmonary embolism. Positive D-dimer. EXAM: CT ANGIOGRAPHY CHEST WITH CONTRAST TECHNIQUE: Multidetector CT imaging of the chest was performed using the standard protocol during bolus administration of intravenous contrast. Multiplanar CT image reconstructions and MIPs were obtained to evaluate the vascular anatomy. CONTRAST:  50m OMNIPAQUE IOHEXOL 350 MG/ML SOLN COMPARISON:  None. FINDINGS: Cardiovascular: Normal heart size. No pericardial effusion. There is aortic and coronary atherosclerosis. No pulmonary artery filling defect Mediastinum/Nodes: Negative for adenopathy or mass. Lungs/Pleura: The central airways are clear. Right posterior costophrenic sulcus opacity with volume loss attributed atelectasis. Minimal apical emphysema. Upper Abdomen: Lobulated liver raising the possibility of cirrhosis, also seen described on a  2018 CT. Cholecystectomy. No acute finding. Musculoskeletal: No acute or aggressive finding. Review of the MIP images confirms the above findings. IMPRESSION: 1. No acute finding, including pulmonary embolism. 2. Mild dependent atelectasis. 3. Aortic Atherosclerosis (ICD10-I70.0). Electronically Signed   By: JMonte FantasiaM.D.   On: 01/19/2020 05:01   EP PPM/ICD IMPLANT  Result Date: 01/14/2020 CONCLUSIONS:  1. Successful implantation of a St. Jude dual-chamber pacemaker for symptomatic bradycardia due to complete heart block  2. No early apparent complications.  Cristopher Peru, MD 01/14/2020 5:58 PM   DG Chest Portable 1 View  Result Date: 01/13/2020 CLINICAL DATA:  Hypoxia EXAM: PORTABLE CHEST 1 VIEW COMPARISON:  08/06/2019 FINDINGS: Cardiac shadow is stable. Aortic calcifications are again seen. The lungs are well aerated bilaterally. Mild bibasilar atelectasis is noted. No sizable effusion is seen. No bony abnormality is noted. IMPRESSION: Mild bibasilar atelectasis.  No other focal abnormality is seen. Electronically Signed   By: Inez Catalina M.D.   On: 01/13/2020 12:21   ECHOCARDIOGRAM COMPLETE  Result Date: 01/13/2020    ECHOCARDIOGRAM REPORT   Patient Name:   [name] Date of Exam: 01/13/2020 Medical Rec #:  [id number]        Height:       63.0 in Accession #:    [id number]       Weight:       181.9 lb Date of Birth:  March 10, 1936        BSA:          1.857 m Patient Age:    84 years         BP:           183/76 mmHg Patient Gender: F                HR:           46 bpm. Exam Location:  Inpatient Procedure: 2D Echo, Cardiac Doppler and Color Doppler Indications:    Heartblock I45.9  History:        Patient has prior history of Echocardiogram examinations, most                 recent 05/12/2013. Risk Factors:Diabetes, Hypertension and GERD.  Sonographer:    Bernadene Person RDCS Referring Phys: [id number] [place]  Sonographer Comments: Technically difficult study due to poor echo  windows. Image acquisition challenging due to uncooperative patient. Patient was unable to turn on her side or stay still. During the exam the patient expressed that she wanted the test to be over and did not want it to continue even after explaining the importance of the exam. IMPRESSIONS  1. Left ventricular ejection fraction, by estimation, is 60 to 65%. The left ventricle has normal function. The left ventricle has no regional wall motion abnormalities. Left ventricular diastolic parameters are indeterminate.  2. Right ventricular systolic function is normal. The right ventricular size is normal.  3. The mitral valve is normal in structure. No evidence of mitral valve regurgitation. No evidence of mitral stenosis.  4. The aortic valve is tricuspid. Aortic valve regurgitation is not visualized. Mild to moderate aortic valve sclerosis/calcification is present, without any evidence of aortic stenosis.  5. The inferior vena cava is normal in size with greater than 50% respiratory variability, suggesting right atrial pressure of 3 mmHg. FINDINGS  Left Ventricle: Left ventricular ejection fraction, by estimation, is 60 to 65%. The left ventricle has normal function. The left ventricle has no regional wall motion abnormalities. The left ventricular internal cavity size was normal in size. There is  no left ventricular hypertrophy. Left ventricular diastolic parameters are indeterminate. Right Ventricle: The right ventricular size is normal. No increase in right ventricular wall thickness. Right ventricular systolic function is normal. Left Atrium: Left atrial size was normal in size. Right Atrium: Right atrial size was normal in size. Pericardium: There is no evidence of pericardial effusion. Mitral Valve: The mitral valve is normal in structure. Normal mobility of the mitral valve leaflets.  Mild to moderate mitral annular calcification. No evidence of mitral valve regurgitation. No evidence of mitral valve stenosis.  Tricuspid Valve: The tricuspid valve is normal in structure. Tricuspid valve regurgitation is trivial. No evidence of tricuspid stenosis. Aortic Valve: The aortic valve is tricuspid. Aortic valve regurgitation is not visualized. Mild to moderate aortic valve sclerosis/calcification is present, without any evidence of aortic stenosis. Pulmonic Valve: The pulmonic valve was normal in structure. Pulmonic valve regurgitation is not visualized. No evidence of pulmonic stenosis. Aorta: The aortic root is normal in size and structure. Venous: The inferior vena cava is normal in size with greater than 50% respiratory variability, suggesting right atrial pressure of 3 mmHg. IAS/Shunts: No atrial level shunt detected by color flow Doppler.  LEFT VENTRICLE PLAX 2D LVIDd:         4.60 cm LVIDs:         2.80 cm LV PW:         0.80 cm LV IVS:        0.90 cm LVOT diam:     1.90 cm LV SV:         69 LV SV Index:   37 LVOT Area:     2.84 cm  RIGHT VENTRICLE TAPSE (M-mode): 2.0 cm LEFT ATRIUM             Index       RIGHT ATRIUM          Index LA diam:        3.50 cm 1.88 cm/m  RA Area:     8.44 cm LA Vol (A2C):   40.2 ml 21.65 ml/m RA Volume:   13.40 ml 7.22 ml/m LA Vol (A4C):   22.6 ml 12.17 ml/m LA Biplane Vol: 31.2 ml 16.80 ml/m  AORTIC VALVE LVOT Vmax:   100.00 cm/s LVOT Vmean:  73.300 cm/s LVOT VTI:    0.245 m  AORTA Ao Root diam: 3.30 cm Ao Asc diam:  3.40 cm  SHUNTS Systemic VTI:  0.24 m Systemic Diam: 1.90 cm Fransico Him MD Electronically signed by Fransico Him MD Signature Date/Time: 01/13/2020/4:04:02 PM    Final     Microbiology: Recent Results (from the past 240 hour(s))  Urine culture     Status: Abnormal   Collection Time: 01/13/20 12:42 PM   Specimen: Urine, Catheterized  Result Value Ref Range Status   Specimen Description URINE, CATHETERIZED  Final   Special Requests NONE  Final   Culture (A)  Final    >=100,000 COLONIES/mL KLEBSIELLA PNEUMONIAE >=100,000 COLONIES/mL LACTOBACILLUS  SPECIES Standardized susceptibility testing for this organism is not available. Performed at [facility], [place] [address] [place], [place] [address]    Report Status 01/16/2020 FINAL  Final   Organism ID, Bacteria KLEBSIELLA PNEUMONIAE (A)  Final      Susceptibility   Klebsiella pneumoniae - MIC*    AMPICILLIN >=32 RESISTANT Resistant     CEFAZOLIN <=4 SENSITIVE Sensitive     CEFTRIAXONE <=0.25 SENSITIVE Sensitive     CIPROFLOXACIN <=0.25 SENSITIVE Sensitive     GENTAMICIN <=1 SENSITIVE Sensitive     IMIPENEM <=0.25 SENSITIVE Sensitive     NITROFURANTOIN 128 RESISTANT Resistant     TRIMETH/SULFA <=20 SENSITIVE Sensitive     AMPICILLIN/SULBACTAM 8 SENSITIVE Sensitive     * >=100,000 COLONIES/mL KLEBSIELLA PNEUMONIAE  SARS Coronavirus 2 by RT PCR (hospital order, performed in [place] hospital lab) Nasopharyngeal Nasopharyngeal Swab     Status: None   Collection Time:  01/13/20  1:22 PM   Specimen: Nasopharyngeal Swab  Result Value Ref Range Status   SARS Coronavirus 2 NEGATIVE NEGATIVE Final    Comment: (NOTE) SARS-CoV-2 target nucleic acids are NOT DETECTED.  The SARS-CoV-2 RNA is generally detectable in upper and lower respiratory specimens during the acute phase of infection. The lowest concentration of SARS-CoV-2 viral copies this assay can detect is 250 copies / mL. A negative result does not preclude SARS-CoV-2 infection and should not be used as the sole basis for treatment or other patient management decisions.  A negative result may occur with improper specimen collection / handling, submission of specimen other than nasopharyngeal swab, presence of viral mutation(s) within the areas targeted by this assay, and inadequate number of viral copies (<250 copies / mL). A negative result must be combined with clinical observations, patient history, and epidemiological information.  Fact Sheet for Patients:   StrictlyIdeas.no  Fact Sheet for  Healthcare Providers: BankingDealers.co.za  This test is not yet approved or  cleared by the [place] FDA and has been authorized for detection and/or diagnosis of SARS-CoV-2 by FDA under an Emergency Use Authorization (EUA).  This EUA will remain in effect (meaning this test can be used) for the duration of the COVID-19 declaration under Section 564(b)(1) of the Act, 21 U.S.C. section 360bbb-3(b)(1), unless the authorization is terminated or revoked sooner.  Performed at [facility], [place] [address], [place], [place] [address]   Culture, blood (routine x 2)     Status: None   Collection Time: 01/13/20  4:03 PM   Specimen: BLOOD  Result Value Ref Range Status   Specimen Description BLOOD SITE NOT SPECIFIED  Final   Special Requests   Final    BOTTLES DRAWN AEROBIC AND ANAEROBIC Blood Culture results may not be optimal due to an inadequate volume of blood received in culture bottles   Culture   Final    NO GROWTH 5 DAYS Performed at [facility], [place] [address], [place], [place] [address]    Report Status 01/18/2020 FINAL  Final  Culture, blood (routine x 2)     Status: None   Collection Time: 01/13/20  4:04 PM   Specimen: BLOOD  Result Value Ref Range Status   Specimen Description BLOOD SITE NOT SPECIFIED  Final   Special Requests   Final    BOTTLES DRAWN AEROBIC ONLY Blood Culture results may not be optimal due to an inadequate volume of blood received in culture bottles   Culture   Final    NO GROWTH 5 DAYS Performed at [facility], [place] [address] [place], [place] [address]    Report Status 01/18/2020 FINAL  Final  Surgical PCR screen     Status: None   Collection Time: 01/14/20  2:39 PM   Specimen: Nasal Mucosa; Nasal Swab  Result Value Ref Range Status   MRSA, PCR NEGATIVE NEGATIVE Final   Staphylococcus aureus NEGATIVE NEGATIVE Final    Comment: (NOTE) The Xpert SA Assay (FDA approved for NASAL specimens in patients  36 years of age and older), is one component of a comprehensive surveillance program. It is not intended to diagnose infection nor to guide or monitor treatment. Performed at [facility], [place] [address] [place],  [address]   Culture, Urine     Status: Abnormal   Collection Time: 01/19/20  8:39 AM   Specimen: Urine, Random  Result Value Ref Range Status   Specimen Description  URINE, RANDOM  Final   Special Requests   Final    NONE Performed at [facility], [place] [address] [place], [place] [address]    Culture MULTIPLE SPECIES PRESENT, SUGGEST RECOLLECTION (A)  Final   Report Status 01/20/2020 FINAL  Final  SARS Coronavirus 2 by RT PCR (hospital order, performed in [facility] hospital lab) Nasopharyngeal Nasopharyngeal Swab     Status: None   Collection Time: 01/22/20  1:47 PM   Specimen: Nasopharyngeal Swab  Result Value Ref Range Status   SARS Coronavirus 2 NEGATIVE NEGATIVE Final    Comment: (NOTE) SARS-CoV-2 target nucleic acids are NOT DETECTED.  The SARS-CoV-2 RNA is generally detectable in upper and lower respiratory specimens during the acute phase of infection. The lowest concentration of SARS-CoV-2 viral copies this assay can detect is 250 copies / mL. A negative result does not preclude SARS-CoV-2 infection and should not be used as the sole basis for treatment or other patient management decisions.  A negative result may occur with improper specimen collection / handling, submission of specimen other than nasopharyngeal swab, presence of viral mutation(s) within the areas targeted by this assay, and inadequate number of viral copies (<250 copies / mL). A negative result must be combined with clinical observations, patient history, and epidemiological information.  Fact Sheet for Patients:   StrictlyIdeas.no  Fact Sheet for Healthcare Providers: BankingDealers.co.za  This test is not yet approved  or  cleared by the [place] FDA and has been authorized for detection and/or diagnosis of SARS-CoV-2 by FDA under an Emergency Use Authorization (EUA).  This EUA will remain in effect (meaning this test can be used) for the duration of the COVID-19 declaration under Section 564(b)(1) of the Act, 21 U.S.C. section 360bbb-3(b)(1), unless the authorization is terminated or revoked sooner.  Performed at [facility], [place] [address], [place], [place] [address]      Labs: Basic Metabolic Panel: Recent Labs  Lab 01/18/20 1727 01/20/20 0625 01/22/20 0425  NA 136 137 136  K 4.0 4.3 4.4  CL 94* 97* 96*  CO2 _0 GLUCOSE 250* 199* 255*  BUN _1 CREATININE 0.76 0.61 0.73  CALCIUM 8.9 8.9 9.2   Liver Function Tests: No results for input(s): AST, ALT, ALKPHOS, BILITOT, PROT, ALBUMIN in the last 168 hours. No results for input(s): LIPASE, AMYLASE in the last 168 hours. No results for input(s): AMMONIA in the last 168 hours. CBC: Recent Labs  Lab 01/18/20 1727 01/20/20 0625 01/22/20 0425  WBC 11.9* 9.1 8.6  HGB 13.9 13.2 12.7  HCT 46.1* 44.1 40.9  MCV 86.8 87.8 87.4  PLT 206 140* 157   Cardiac Enzymes: No results for input(s): CKTOTAL, CKMB, CKMBINDEX, TROPONINI in the last 168 hours. BNP: BNP (last 3 results) No results for input(s): BNP in the last 8760 hours.  ProBNP (last 3 results) No results for input(s): PROBNP in the last 8760 hours.  CBG: Recent Labs  Lab 01/21/20 2136 01/22/20 0639 01/22/20 1114 01/22/20 1606 01/23/20 0654  GLUCAP 240* 243* 224* 246* 266*   Signed:  Domenic Polite MD.  [facility] 01/23/2020, 10:38 AM

## 2020-01-23 NOTE — TOC Transition Note (Signed)
Transition of Care [facility]) - CM/SW Discharge Note   Patient Details  Name: CHLORA MCBAIN MRN: [id number] Date of Birth: 07-Nov-1935  Transition of [facility]) CM/SW Contact:  Benard Halsted, [place] Phone Number: 01/23/2020, 2:04 PM   Clinical Narrative:    Patient will DC to: [facility] Anticipated DC date: 01/23/20 Family notified: Jefferey Pica Transport by: Corey Harold 2:30pm   Per MD patient ready for DC to [facility] RN to call report prior to discharge [phone] Room 513). RN, patient, patient's family, and facility notified of DC. Discharge Summary and FL2 sent to facility. DC packet on chart. Ambulance transport requested for patient.   CSW will sign off for now as social work intervention is no longer needed. Please consult [place] again if new needs arise.      Final next level of care: Skilled Nursing Facility Barriers to Discharge: Barriers Resolved   Patient Goals and CMS Choice Patient states their goals for this hospitalization and ongoing recovery are:: To get stronger CMS Medicare.gov Compare Post Acute Care list provided to:: Other (Comment Required) (Left in pt's room for [facility])    Discharge Placement   Existing PASRR number confirmed : 01/23/20          Patient [facility] bed at: [facility] Patient to be transferred to facility by: [facility] Name of family member notified: Friend Freda Munro) Patient and family notified of of transfer: 01/23/20  Discharge Plan and Services                                     Social Determinants of Health (SDOH) Interventions     Readmission Risk Interventions No flowsheet data found.

## 2020-01-23 NOTE — Care Management Important Message (Signed)
Important Message  Patient Details  Name: [name] MRN: [id number] Date of Birth: Jun 19, 1935   Medicare Important Message Given:  Yes - Important Message mailed due to current National Emergency  Verbal consent obtained due to current National Emergency  Relationship to patient: Self Contact Name: Lycia Sachdeva Call Date: 01/23/20  Time: 1229 Phone: [phone] Outcome: Spoke with contact Important Message mailed to: Patient address on file    Delorse Lek 01/23/2020, 12:30 PM

## 2020-01-23 NOTE — Progress Notes (Signed)
Inpatient Diabetes Program Recommendations  AACE/ADA: New Consensus Statement on Inpatient Glycemic Control (2015)  Target Ranges:  Prepandial:   less than 140 mg/dL      Peak postprandial:   less than 180 mg/dL (1-2 hours)      Critically ill patients:  140 - 180 mg/dL   Lab Results  Component Value Date   GLUCAP 266 (H) 01/23/2020   HGBA1C 9.8 (H) 01/14/2020    Review of Glycemic Control Results for [name], [name] (MRN [id number]) as of 01/23/2020 10:26  Ref. Range 01/22/2020 06:39 01/22/2020 11:14 01/22/2020 16:06 01/23/2020 06:54  Glucose-Capillary Latest Ref Range: 70 - 99 mg/dL 243 (H) 224 (H) 246 (H) 266 (H)   Diabetes history:DM 2 Outpatient Diabetes medications: Novolog 14-16 units with breakfast and lunch and Novolog 12-16 units with supper Tresiba 58 units daily Current orders for Inpatient glycemic control: Novologresistanttid with meals  Lantus 20 units QD  Inpatient Diabetes Program Recommendations:  Consider slight increase to Lantus to 25 units QD and add Novolog 2 units TID (assuming patient is consuming >50% of meals).   Thanks, Bronson Curb, MSN, RNC-OB Diabetes Coordinator [phone] (8a-5p)

## 2020-01-23 NOTE — Plan of Care (Signed)
  Problem: Safety: Goal: Ability to remain free from injury will improve Outcome: Progressing   

## 2020-01-26 ENCOUNTER — Encounter: Payer: Self-pay | Admitting: Family

## 2020-01-26 ENCOUNTER — Non-Acute Institutional Stay (SKILLED_NURSING_FACILITY): Payer: Medicare Other | Admitting: Family

## 2020-01-26 DIAGNOSIS — B372 Candidiasis of skin and nail: Secondary | ICD-10-CM | POA: Diagnosis not present

## 2020-01-26 DIAGNOSIS — I10 Essential (primary) hypertension: Secondary | ICD-10-CM

## 2020-01-26 DIAGNOSIS — J9611 Chronic respiratory failure with hypoxia: Secondary | ICD-10-CM | POA: Diagnosis not present

## 2020-01-26 DIAGNOSIS — Z789 Other specified health status: Secondary | ICD-10-CM

## 2020-01-26 NOTE — Progress Notes (Signed)
Location:    [facility] Room Number: 161-W Place of Service:  SNF (31) Provider:  Marlowe Sax, NP    Patient Care Team: Patient, No Pcp Per as PCP - General (General Practice) Raynelle Bring, MD as Consulting Physician (Urology)  Extended Emergency Contact Information Primary Emergency Contact: Larinda [place] Phone: [phone] Mobile Phone: [phone] Relation: Friend  Code Status:  FULL CODE Goals of care: Advanced Directive information Advanced Directives 01/26/2020  Does Patient Have a Medical Advance Directive? No  Type of Advance Directive -  Does patient want to make changes to medical advance directive? No - Patient declined  Copy of [place] in Chart? -  Would patient like information on creating a medical advance directive? -  Pre-existing out of facility DNR order (yellow form or pink MOST form) -     Chief Complaint  Patient presents with   Acute Visit    Perineal buttock area redness.     HPI:  Pt is a 84 y.o. female seen today for an acute visit for evaluation of skin redness on buttocks.she is seen in her room today awake in bed.she has a medical history of Hypertension,COPD,Chronic respiratory failure with hypoxia on oxygen via nasal canula,type 2 DM with peripheral neuropathy,Thrombocytopenia ,Complete Heart block status post pacemaker 01/14/2020,Hyperlipidemia,Generalized anxiety,Obesity,constipation among other conditions.she status post hospital admission from 01/18/2020 - 01/23/2020 for  Acute cystitis and acute metabolic encephalopathy.He urinalysis result was had 21-50 WBC and many bacteria.she was treated with three days course of ceftriaxone.Her urine culture showed multiple species.of note she is status post pacemaker previous hospital admission 01/14/2020.states doing well but just feels tired after therapy Facility Nurse reports patient has skin redness to groin and  buttocks too. No fever  or chills.    Past Medical History:  Diagnosis Date   Allergic rhinitis, cause unspecified    Sinus CT Rec 12-23-2009   Arthritis    Benign paroxysmal positional vertigo 08/09/2012   Chronic airway obstruction, not elsewhere classified    HFA 75-90% after coaching 06-21-863   Complication of anesthesia    takes along time to wake up   Diabetes mellitus    Diarrhea    Diverticulosis    Esophageal reflux    Esophageal stricture    GERD (gastroesophageal reflux disease)    Glaucoma    Hiatal hernia    Hypertension    Irritable bowel syndrome    Neuropathy in diabetes ([place]) 08/09/2012   Other chronic nonalcoholic liver disease    Oxygen deficiency    Presence of permanent cardiac pacemaker 01/14/2020   Sciatica    Scoliosis    Shingles 2010   Shortness of breath    Steatohepatitis    Past Surgical History:  Procedure Laterality Date   APPENDECTOMY     CARPAL TUNNEL RELEASE     right hand   CHOLECYSTECTOMY OPEN  1978   COLONOSCOPY  07-2001   mild diverticulosis   ESOPHAGOGASTRODUODENOSCOPY  7846,96-29   H Hernia,es.stricture s/p dil 59F   INTRAOCULAR LENS INSERTION Bilateral    LIVER BIOPSY  09-2839   PACEMAKER IMPLANT N/A 01/14/2020   Procedure: PACEMAKER IMPLANT;  Surgeon: Evans Lance, MD;  Location: [facility];  Service: Cardiovascular;  Laterality: N/A;   PARATHYROID EXPLORATION     TONSILLECTOMY AND ADENOIDECTOMY     TOTAL ABDOMINAL HYSTERECTOMY     ULNAR NERVE TRANSPOSITION  12/07/2011   Procedure: ULNAR  NERVE DECOMPRESSION/TRANSPOSITION;this was cancelled-not done  Surgeon: Cammie Sickle., MD;  Location: [facility];  Service: Orthopedics;  Laterality: Right;  right ulnar nerve in situ decompression   ULNAR TUNNEL RELEASE  03/07/2012   Procedure: CUBITAL TUNNEL RELEASE;  Surgeon: Roseanne Kaufman, MD;  Location: [facility];  Service: Orthopedics;  Laterality: Right;   ulnar nerve release at the elbow      Allergies  Allergen Reactions   Chlordiazepoxide-Clidinium Other (See Comments)    Sleepy,weak   Biaxin [Clarithromycin] Other (See Comments)    Foul taste, abd pain, diarrhea   Tramadol Other (See Comments)    Caused tremors   Codeine Other (See Comments)    REACTION: gi upset- only in high doses per pt.  Can tolerate cough syrup with codeine.     Allergies as of 01/26/2020      Reactions   Chlordiazepoxide-clidinium Other (See Comments)   Sleepy,weak   Biaxin [clarithromycin] Other (See Comments)   Foul taste, abd pain, diarrhea   Tramadol Other (See Comments)   Caused tremors   Codeine Other (See Comments)   REACTION: gi upset- only in high doses per pt.  Can tolerate cough syrup with codeine.       Medication List       Accurate as of January 26, 2020  4:16 PM. If you have any questions, ask your nurse or doctor.        STOP taking these medications   Accu-Chek Aviva Plus test strip Generic drug: glucose blood Stopped by: Sandrea Hughs, NP   Accu-Chek Softclix Lancet Dev Kit Stopped by: Sandrea Hughs, NP   Accu-Chek Softclix Lancets lancets Stopped by: Sandrea Hughs, NP   AeroChamber MV inhaler Stopped by: Sandrea Hughs, NP   B-D UF III MINI PEN NEEDLES 31G X 5 MM Misc Generic drug: Insulin Pen Needle Stopped by: Nelda Bucks Horatio Bertz, NP   Insulin Pen Needle 31G X 8 MM Misc Commonly known as: B-D ULTRAFINE III SHORT PEN Stopped by: Sandrea Hughs, NP   Insulin Syringe-Needle U-100 31G X 15/64" 0.5 ML Misc Commonly known as: BD Insulin Syringe Ultrafine Stopped by: Sandrea Hughs, NP   OXYGEN Stopped by: Sandrea Hughs, NP     TAKE these medications   acetaminophen 325 MG tablet Commonly known as: TYLENOL Take 2 tablets (650 mg total) by mouth every 6 (six) hours as needed for mild pain (or Fever >/= 101).   ALPRAZolam 0.5 MG tablet Commonly known as: XANAX Take 0.5 tablets (0.25 mg total) by  mouth as needed for anxiety or sleep.   aspirin 325 MG tablet Take 325 mg by mouth daily.   bisacodyl 10 MG suppository Commonly known as: DULCOLAX Place 10 mg rectally as needed for moderate constipation.   Breztri Aerosphere 160-9-4.8 MCG/ACT Aero Generic drug: Budeson-Glycopyrrol-Formoterol Inhale 2 puffs into the lungs 2 (two) times daily.   ipratropium-albuterol 0.5-2.5 (3) MG/3ML Soln Commonly known as: DUONEB 1 vial in neb every 8 hours and as needed   irbesartan 75 MG tablet Commonly known as: Avapro Take 1 tablet (75 mg total) by mouth daily.   levalbuterol 45 MCG/ACT inhaler Commonly known as: XOPENEX HFA Inhale 1-2 puffs into the lungs every 6 (six) hours as needed for wheezing.   magic mouthwash Soln Swish with 2 teaspoonfuls by mouth as needed   metoprolol succinate 50 MG 24 hr tablet Commonly known as: TOPROL-XL Take 1 tablet (50 mg total) by  mouth daily. Take with or immediately following a meal.   MILK OF MAGNESIA PO Take 30 mLs by mouth as needed.   omeprazole 20 MG capsule Commonly known as: PRILOSEC TAKE (1) CAPSULE DAILY.   polyethylene glycol 17 g packet Commonly known as: MIRALAX / GLYCOLAX Take 17 g by mouth daily as needed.   pravastatin 20 MG tablet Commonly known as: PRAVACHOL Take 1 tablet (20 mg total) by mouth daily.   RA SALINE ENEMA RE Place rectally as needed.   sodium chloride HYPERTONIC 3 % nebulizer solution 1 ampule neb every 6 hours if needed to clear mucus   Stiolto Respimat 2.5-2.5 MCG/ACT Aers Generic drug: Tiotropium Bromide-Olodaterol USE 2 PUFFS DAILY   Tresiba FlexTouch 200 UNIT/ML FlexTouch Pen Generic drug: insulin degludec Inject 30 Units into the skin daily. What changed: Another medication with the same name was removed. Continue taking this medication, and follow the directions you see here. Changed by: Sandrea Hughs, NP       Review of Systems  Constitutional: Negative for appetite change, chills,  fatigue and fever.  Respiratory: Negative for cough, chest tightness, shortness of breath and wheezing.        On continuous oxygen via nasal cannula   Cardiovascular: Negative for chest pain, palpitations and leg swelling.  Gastrointestinal: Negative for abdominal distention, abdominal pain, constipation, diarrhea, nausea and vomiting.  Genitourinary:       Incontinent   Musculoskeletal: Positive for gait problem. Negative for joint swelling and myalgias.  Skin: Positive for color change. Negative for pallor and rash.       Skin redness to groin and buttocks   Psychiatric/Behavioral: Negative for agitation, confusion and sleep disturbance. The patient is not nervous/anxious.     Immunization History  Administered Date(s) Administered   Fluad Quad(high Dose 65+) 01/30/2019   Influenza Split 01/07/2011, 02/22/2012   Influenza Whole 05/09/2007, 02/05/2010, 01/22/2017   Influenza, High Dose Seasonal PF 02/06/2018   Influenza,inj,Quad PF,6+ Mos 01/21/2013, 03/18/2014, 01/08/2015, 01/13/2016   Janssen (J&J) SARS-COV-2 Vaccination 01/23/2020   Pneumococcal Conjugate-13 11/24/2013   Pneumococcal Polysaccharide-23 04/23/2012   Pertinent  Health Maintenance Due  Topic Date Due   DEXA SCAN  Never done   FOOT EXAM  02/14/2017   OPHTHALMOLOGY EXAM  12/28/2018   INFLUENZA VACCINE  12/07/2019   HEMOGLOBIN A1C  07/13/2020   PNA vac Low Risk Adult  Completed   Fall Risk  07/15/2015 07/03/2014  Falls in the past year? No No    Vitals:   01/26/20 1605  BP: 140/66  Pulse: 82  Resp: 18  Temp: (!) 97.3 F (36.3 C)  Weight: 178 lb 11.2 oz (81.1 kg)  Height: _0  (1.6 m)   Body mass index is 31.66 kg/m. Physical Exam Vitals and nursing note reviewed.  Constitutional:      General: She is not in acute distress.    Appearance: She is not ill-appearing.  HENT:     Head: Normocephalic.  Eyes:     General: No scleral icterus.       Right eye: No discharge.        Left eye:  No discharge.     Conjunctiva/sclera: Conjunctivae normal.     Pupils: Pupils are equal, round, and reactive to light.  Cardiovascular:     Heart sounds: Normal heart sounds. No murmur heard.  No friction rub. No gallop.      Comments: Pace maker in place incision site covered with steri -strips dry,clean and intact  Pulmonary:     Effort: Pulmonary effort is normal. No respiratory distress.     Breath sounds: No wheezing, rhonchi or rales.     Comments: Continuous Oxygen via nasal canula in place  Chest:     Chest wall: No tenderness.  Abdominal:     General: Bowel sounds are normal. There is no distension.     Palpations: Abdomen is soft. There is no mass.     Tenderness: There is no abdominal tenderness. There is no right CVA tenderness, left CVA tenderness, guarding or rebound.  Musculoskeletal:        General: No swelling or tenderness.     Right lower leg: No edema.     Left lower leg: No edema.     Comments: Unsteady gait   Skin:    General: Skin is warm.     Coloration: Skin is not pale.     Findings: Erythema present. No bruising or rash.     Comments: Right groin and gluteal beefy redness.right groin with painful  Fissure.     Neurological:     Mental Status: She is alert and oriented to person, place, and time.     Coordination: Coordination normal.     Gait: Gait abnormal.  Psychiatric:        Mood and Affect: Mood normal.        Behavior: Behavior normal.        Thought Content: Thought content normal.        Judgment: Judgment normal.     Labs reviewed: Recent Labs    01/13/20 1603 01/14/20 0338 01/15/20 0708 01/15/20 0708 01/18/20 1727 01/20/20 0625 01/22/20 0425  NA  --    < > 139   < > 136 137 136  K  --    < > 4.3   < > 4.0 4.3 4.4  CL  --    < > 100   < > 94* 97* 96*  CO2  --    < > 30   < > _0 GLUCOSE  --    < > 172*   < > 250* 199* 255*  BUN  --    < > 21   < > _1 CREATININE 0.82   < > 0.72   < > 0.76 0.61 0.73  CALCIUM  --    <  > 8.7*   < > 8.9 8.9 9.2  MG 1.7  --  2.3  --   --   --   --   PHOS 3.4  --  3.5  --   --   --   --    < > = values in this interval not displayed.   Recent Labs    08/04/19 1106 01/13/20 1213 01/14/20 0338  AST _2 ALT _3 ALKPHOS 51 50 50  BILITOT 0.3 0.8 0.8  PROT 7.5 7.1 6.5  ALBUMIN 4.0 3.4* 3.1*   Recent Labs    08/04/19 1106 08/04/19 1106 01/13/20 1213 01/13/20 1226 01/18/20 1727 01/20/20 0625 01/22/20 0425  WBC 8.4   < > 10.0   < > 11.9* 9.1 8.6  NEUTROABS 3.7  --  8.0*  --   --   --   --   HGB 13.7   < > 12.5   < > 13.9 13.2 12.7  HCT 43.3   < > 41.0   < > 46.1* 44.1 40.9  MCV  83.0   < > 86.3   < > 86.8 87.8 87.4  PLT 166.0   < > 167   < > 206 140* 157   < > = values in this interval not displayed.   Lab Results  Component Value Date   TSH 1.970 01/13/2020   Lab Results  Component Value Date   HGBA1C 9.8 (H) 01/14/2020   Lab Results  Component Value Date   CHOL 160 08/04/2019   HDL 41.70 08/04/2019   LDLCALC 86 08/04/2019   LDLDIRECT 89.0 10/14/2014   TRIG 164.0 (H) 08/04/2019   CHOLHDL 4 08/04/2019    Significant Diagnostic Results in last 30 days:  DG Chest 2 View  Result Date: 01/19/2020 CLINICAL DATA:  Generalized weakness. EXAM: CHEST - 2 VIEW COMPARISON:  January 15, 2020 FINDINGS: A dual lead AICD is noted. There is no evidence of acute infiltrate, pleural effusion or pneumothorax. The cardiac silhouette is mildly enlarged and unchanged in size. Marked severity calcification of the aortic arch is noted. The visualized skeletal structures are unremarkable. IMPRESSION: No active cardiopulmonary disease. Electronically Signed   By: Virgina Norfolk M.D.   On: 01/19/2020 01:15   DG Chest 2 View  Result Date: 01/15/2020 CLINICAL DATA:  Pacemaker. EXAM: CHEST - 2 VIEW COMPARISON:  01/13/2020. FINDINGS: Cardiac pacer with lead tips over the right atrium and right ventricle. Borderline cardiomegaly and pulmonary venous congestion. No  evidence of pulmonary edema. Bibasilar atelectasis. Tiny right pleural effusion cannot be excluded. Surgical clips noted over the lower neck. IMPRESSION: 1. Cardiac pacer with lead tips over the right atrium right ventricle. Borderline cardiomegaly and pulmonary venous congestion. No evidence of pulmonary edema. 2. Bibasilar atelectasis. Tiny right pleural effusion cannot be excluded. Electronically Signed   By: Marcello Moores  Register   On: 01/15/2020 06:00   DG Abd 1 View  Result Date: 01/20/2020 CLINICAL DATA:  Constipation, right-sided pain. History of cholecystectomy and appendectomy. EXAM: ABDOMEN - 1 VIEW supine. Right flank and pelvis is collimated off view. COMPARISON:  X-ray abdomen 01/25/2017 FINDINGS: The bowel gas pattern is normal. No radio-opaque calculi or other significant radiographic abnormality are seen. Right upper quadrant surgical clips again noted. Partially visualized leads overlying the lower hemithorax are likely related to a cardiac pacemaker. No acute osseous abnormality. IMPRESSION: Nonobstructive bowel gas pattern with the right flank and pelvis collimated off view. Electronically Signed   By: Iven Finn M.D.   On: 01/20/2020 18:02   CT Head Wo Contrast  Result Date: 01/13/2020 CLINICAL DATA:  Altered mental status EXAM: CT HEAD WITHOUT CONTRAST TECHNIQUE: Contiguous axial images were obtained from the base of the skull through the vertex without intravenous contrast. COMPARISON:  May 11, 2013 FINDINGS: Brain: Mild diffuse atrophy is stable. There is no intracranial mass, hemorrhage, extra-axial fluid collection, or midline shift. There is small vessel disease in the centra semiovale bilaterally, stable. There is a prior small lacunar infarct in the head of the caudate nucleus on the right. No acute appearing infarct is demonstrable. Vascular: No appreciable hyperdense vessel. There is calcification in each carotid siphon region. Skull: The bony calvarium appears intact.  Sinuses/Orbits: There is mucosal thickening in several ethmoid air cells. Orbits appear symmetric bilaterally. Evidence of previous cataract removals. Other: Visualized mastoid air cells are clear. IMPRESSION: Stable atrophy with periventricular small vessel disease. Prior small infarct in the head of the caudate nucleus on the right. No acute infarct. No mass or hemorrhage. Foci of arterial vascular calcification noted. There is  mucosal thickening in several ethmoid air cells. Electronically Signed   By: Lowella Grip III M.D.   On: 01/13/2020 13:54   CT Angio Chest PE W and/or Wo Contrast  Result Date: 01/19/2020 CLINICAL DATA:  Low to intermediate probability for pulmonary embolism. Positive D-dimer. EXAM: CT ANGIOGRAPHY CHEST WITH CONTRAST TECHNIQUE: Multidetector CT imaging of the chest was performed using the standard protocol during bolus administration of intravenous contrast. Multiplanar CT image reconstructions and MIPs were obtained to evaluate the vascular anatomy. CONTRAST:  63m OMNIPAQUE IOHEXOL 350 MG/ML SOLN COMPARISON:  None. FINDINGS: Cardiovascular: Normal heart size. No pericardial effusion. There is aortic and coronary atherosclerosis. No pulmonary artery filling defect Mediastinum/Nodes: Negative for adenopathy or mass. Lungs/Pleura: The central airways are clear. Right posterior costophrenic sulcus opacity with volume loss attributed atelectasis. Minimal apical emphysema. Upper Abdomen: Lobulated liver raising the possibility of cirrhosis, also seen described on a 2018 CT. Cholecystectomy. No acute finding. Musculoskeletal: No acute or aggressive finding. Review of the MIP images confirms the above findings. IMPRESSION: 1. No acute finding, including pulmonary embolism. 2. Mild dependent atelectasis. 3. Aortic Atherosclerosis (ICD10-I70.0). Electronically Signed   By: JMonte FantasiaM.D.   On: 01/19/2020 05:01   EP PPM/ICD IMPLANT  Result Date: 01/14/2020 CONCLUSIONS:  1.  Successful implantation of a St. Jude dual-chamber pacemaker for symptomatic bradycardia due to complete heart block  2. No early apparent complications.       GCristopher Peru MD 01/14/2020 5:58 PM   DG Chest Portable 1 View  Result Date: 01/13/2020 CLINICAL DATA:  Hypoxia EXAM: PORTABLE CHEST 1 VIEW COMPARISON:  08/06/2019 FINDINGS: Cardiac shadow is stable. Aortic calcifications are again seen. The lungs are well aerated bilaterally. Mild bibasilar atelectasis is noted. No sizable effusion is seen. No bony abnormality is noted. IMPRESSION: Mild bibasilar atelectasis.  No other focal abnormality is seen. Electronically Signed   By: MInez CatalinaM.D.   On: 01/13/2020 12:21   ECHOCARDIOGRAM COMPLETE  Result Date: 01/13/2020    ECHOCARDIOGRAM REPORT   Patient Name:   SQUILLA FREEZEDate of Exam: 01/13/2020 Medical Rec #:  [id number]       Height:       63.0 in Accession #:    [id number]      Weight:       181.9 lb Date of Birth:  [birth year]       BSA:          1.857 m Patient Age:    89years         BP:           183/76 mmHg Patient Gender: F                HR:           46 bpm. Exam Location:  Inpatient Procedure: 2D Echo, Cardiac Doppler and Color Doppler Indications:    Heartblock I45.9  History:        Patient has prior history of Echocardiogram examinations, most                 recent 05/12/2013. Risk Factors:Diabetes, Hypertension and GERD.  Sonographer:    SBernadene PersonRDCS Referring Phys: [id number] Sonographer Comments: Technically difficult study due to poor echo windows. Image acquisition challenging due to uncooperative patient. Patient was unable to turn on her side or stay still. During the exam the patient expressed that she wanted the test to be over and did  not want it to continue even after explaining the importance of the exam. IMPRESSIONS  1. Left ventricular ejection fraction, by estimation, is 60 to 65%. The left ventricle has normal function. The left ventricle has no  regional wall motion abnormalities. Left ventricular diastolic parameters are indeterminate.  2. Right ventricular systolic function is normal. The right ventricular size is normal.  3. The mitral valve is normal in structure. No evidence of mitral valve regurgitation. No evidence of mitral stenosis.  4. The aortic valve is tricuspid. Aortic valve regurgitation is not visualized. Mild to moderate aortic valve sclerosis/calcification is present, without any evidence of aortic stenosis.  5. The inferior vena cava is normal in size with greater than 50% respiratory variability, suggesting right atrial pressure of 3 mmHg. FINDINGS  Left Ventricle: Left ventricular ejection fraction, by estimation, is 60 to 65%. The left ventricle has normal function. The left ventricle has no regional wall motion abnormalities. The left ventricular internal cavity size was normal in size. There is  no left ventricular hypertrophy. Left ventricular diastolic parameters are indeterminate. Right Ventricle: The right ventricular size is normal. No increase in right ventricular wall thickness. Right ventricular systolic function is normal. Left Atrium: Left atrial size was normal in size. Right Atrium: Right atrial size was normal in size. Pericardium: There is no evidence of pericardial effusion. Mitral Valve: The mitral valve is normal in structure. Normal mobility of the mitral valve leaflets. Mild to moderate mitral annular calcification. No evidence of mitral valve regurgitation. No evidence of mitral valve stenosis. Tricuspid Valve: The tricuspid valve is normal in structure. Tricuspid valve regurgitation is trivial. No evidence of tricuspid stenosis. Aortic Valve: The aortic valve is tricuspid. Aortic valve regurgitation is not visualized. Mild to moderate aortic valve sclerosis/calcification is present, without any evidence of aortic stenosis. Pulmonic Valve: The pulmonic valve was normal in structure. Pulmonic valve regurgitation is  not visualized. No evidence of pulmonic stenosis. Aorta: The aortic root is normal in size and structure. Venous: The inferior vena cava is normal in size with greater than 50% respiratory variability, suggesting right atrial pressure of 3 mmHg. IAS/Shunts: No atrial level shunt detected by color flow Doppler.  LEFT VENTRICLE PLAX 2D LVIDd:         4.60 cm LVIDs:         2.80 cm LV PW:         0.80 cm LV IVS:        0.90 cm LVOT diam:     1.90 cm LV SV:         69 LV SV Index:   37 LVOT Area:     2.84 cm  RIGHT VENTRICLE TAPSE (M-mode): 2.0 cm LEFT ATRIUM             Index       RIGHT ATRIUM          Index LA diam:        3.50 cm 1.88 cm/m  RA Area:     8.44 cm LA Vol (A2C):   40.2 ml 21.65 ml/m RA Volume:   13.40 ml 7.22 ml/m LA Vol (A4C):   22.6 ml 12.17 ml/m LA Biplane Vol: 31.2 ml 16.80 ml/m  AORTIC VALVE LVOT Vmax:   100.00 cm/s LVOT Vmean:  73.300 cm/s LVOT VTI:    0.245 m  AORTA Ao Root diam: 3.30 cm Ao Asc diam:  3.40 cm  SHUNTS Systemic VTI:  0.24 m Systemic Diam: 1.90 cm Fransico Him MD  Electronically signed by Fransico Him MD Signature Date/Time: 01/13/2020/4:04:02 PM    Final     Assessment/Plan 1. Candidiasis of skin Beefy skin redness to groin and gluteal areas.right groin fissure noted.incontinency is a contributing factor. - start on Diflucan 150 mg tablet one by mouth x 1 dose then repeat x 1 dose in one week.  - Nystatin 100,000 units cream one application to affected beefy red areas of on groin and gluteal.  - continue good peri-care to keep skin dry.   2. Full code status Report full code in case of emergency.  3. Chronic respiratory failure with hypoxia (HCC) Breathing stable on continuous oxygen via nasal canula. - continue on Breztri Aerospere,Duoneb,Xopenex HFA and Stiolto Respimat   4. Essential hypertension B/p stable. Continue on metoprolol and irbesartan.  Family/ staff Communication: Reviewed plan of care with patient and facility Nurse   Labs/tests ordered:  None

## 2020-01-26 NOTE — Addendum Note (Signed)
Addended byMarlowe Sax C on: 01/26/2020 09:13 PM   Modules accepted: Orders

## 2020-01-27 ENCOUNTER — Other Ambulatory Visit: Payer: Self-pay | Admitting: *Deleted

## 2020-01-27 ENCOUNTER — Non-Acute Institutional Stay (SKILLED_NURSING_FACILITY): Payer: Medicare Other | Admitting: Internal Medicine

## 2020-01-27 ENCOUNTER — Ambulatory Visit (INDEPENDENT_AMBULATORY_CARE_PROVIDER_SITE_OTHER): Payer: Medicare Other | Admitting: *Deleted

## 2020-01-27 ENCOUNTER — Encounter: Payer: Self-pay | Admitting: Internal Medicine

## 2020-01-27 DIAGNOSIS — E1142 Type 2 diabetes mellitus with diabetic polyneuropathy: Secondary | ICD-10-CM | POA: Diagnosis not present

## 2020-01-27 DIAGNOSIS — G3184 Mild cognitive impairment, so stated: Secondary | ICD-10-CM

## 2020-01-27 DIAGNOSIS — J9621 Acute and chronic respiratory failure with hypoxia: Secondary | ICD-10-CM

## 2020-01-27 DIAGNOSIS — M4180 Other forms of scoliosis, site unspecified: Secondary | ICD-10-CM

## 2020-01-27 DIAGNOSIS — I442 Atrioventricular block, complete: Secondary | ICD-10-CM | POA: Diagnosis not present

## 2020-01-27 DIAGNOSIS — F419 Anxiety disorder, unspecified: Secondary | ICD-10-CM | POA: Diagnosis not present

## 2020-01-27 DIAGNOSIS — Z950 Presence of cardiac pacemaker: Secondary | ICD-10-CM | POA: Diagnosis not present

## 2020-01-27 DIAGNOSIS — M4150 Other secondary scoliosis, site unspecified: Secondary | ICD-10-CM

## 2020-01-27 NOTE — Progress Notes (Signed)
Provider:  Rexene Edison. Mariea Clonts, D.O., C.M.D. Location:  [place] Room Number: [place] of Service:  SNF (31)   PCP: Patient, No Pcp Per Patient Care Team: Patient, No Pcp Per as PCP - General ([place]) Raynelle Bring, MD as Consulting Physician (Urology)  Extended Emergency Contact Information Primary Emergency Contact: Larinda Buttery [place] Phone: [phone] Mobile Phone: [phone] Relation: Friend  Code Status: FULL CODE Goals of Care: Advanced Directive information Advanced Directives 01/26/2020  Does Patient Have a Medical Advance Directive? No  Type of Advance Directive -  Does patient want to make changes to medical advance directive? No - Patient declined  Copy of Outlook in Chart? -  Would patient like information on creating a medical advance directive? -  Pre-existing out of facility DNR order (yellow form or pink MOST form) -   Chief Complaint  Patient presents with  . New Admit To SNF    New Admit    HPI: Patient is a 84 y.o. female seen today for admission to [place] [facility] rehab status post hospitalization from September 12 through September 17 at [facility] with failure to thrive.  She has a past medical history significant for chronic respiratory failure with hypoxia chronic obstructive pulmonary disease of mixed type, irritable bowel syndrome diarrhea predominant, type 2 diabetes with neurologic complications, benign positional vertigo, lumbar spinal stenosis, complete heart block status post pacemaker, scoliosis, fatty liver disease, glaucoma, hypertension, among others.  She had presented to the emergency department after her caregiver called the [facility] Department who performed a welfare check and found her sitting in a chair with oxygen saturations 85% with MD oxygen tanks.  She had recently been hospitalized prior to this from September 7-10 with  acute delirium.  At that time it was felt to be secondary to heart block and hypoxia with signs of Klebsiella UTI for which she was treated with antibiotics.  She lives alone and has a caregiver who checks on her.  She admitted that her memory was short.  She is followed by [facility] urology for frequent urinary tract infections.  In the ED she was afebrile but her heart rate ranged from 59-123 respirations from 18-32 blood pressure is also labile from 125/48-160 5/70 full oxygen was maintained on 4 L via nasal cannula.  Labs revealed WBC of 11.9, CABG 250 with an anion gap, troponins 68 and 71.  Her D-dimer was 3.71 UA was positive.  CTA of the chest did not show any signs of PE or infiltrate.  In the ED she was given 5 mg of IV metoprolol 50 mg of p.o. Toprol and Rocephin IV.  She was then admitted to the hospital.  Her delirium this time was determined to be due to hypoxemia and her urinary tract infection.  Notes indicate she does have an early dementia.  Her oxygen stabilized with supplemental O2 and treatment of her urinary tract infection.  CT of her brain did indicate atrophy.  She had therapy evaluation and SNF was recommended.  Her friend, Freda Munro, has been helping her make decisions.  Psychiatry evaluated her during her inpatient stay and Ms. Winchel was determined not to have capacity to make her decisions at that time.  She completed a 3-day course of Rocephin for her urinary tract infection.  She was noted to have mild thrombocytopenia  Her elevated troponin was felt to be due to demand ischemia from  hypoxemia.  Regarding her complete heart block with pacemaker she is to follow-up with electrophysiology outpatient.  For her type 2 diabetes her Tyler Aas was resumed at discharge though she had a sliding scale during her hospitalization.  She was continued on her Avapro and metoprolol for hypertension.  She comes here on a low-sodium heart healthy diet.  She has as needed Xanax for her anxiety  and shortness of breath at home.  She was continued on pravastatin for hyperlipidemia.  She was noted to be obese with a BMI of 30.  She was continued on laxatives for her constipation.  She was also noted during her hospitalization on 9/7 to have a medial sacrum stage I pressure injury and on 9/13 to have a stage II buttocks pressure injury partial thickness loss of the dermis with a red-pink wound bed without slough or redness. Here she is being treated for skin yeast infection.   She had negative Covid tests on September 7 and 16.  She was given the [place] vaccine September 17 at the hospital.  Hospital discharge summary recommends further follow-up about goals of care and CODE STATUS which is currently full code.  Her chart also does not list a current primary care physician with her endocrinologist listed as the primary at one point--he is only doing specialty work now.  I did try to discuss her code status with her and she wants an attempt, but if she's shocked twice and does not come back, we should let her go.    Past Medical History:  Diagnosis Date  . Allergic rhinitis, cause unspecified    Sinus CT Rec 12-23-2009  . Arthritis   . Benign paroxysmal positional vertigo 08/09/2012  . 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 Complication of anesthesia    takes along time to wake up  . Diabetes mellitus   . Diarrhea   . Diverticulosis   . Esophageal reflux   . Esophageal stricture   . GERD (gastroesophageal reflux disease)   . Glaucoma   . Hiatal hernia   . Hypertension   . Irritable bowel syndrome   . Neuropathy in diabetes ([place]) 08/09/2012  . Other chronic nonalcoholic liver disease   . Oxygen deficiency   . Presence of permanent cardiac pacemaker 01/14/2020  . Sciatica   . Scoliosis   . Shingles 2010  . Shortness of breath   . Steatohepatitis    Past Surgical History:  Procedure Laterality Date    . APPENDECTOMY    . CARPAL TUNNEL RELEASE     right hand  . CHOLECYSTECTOMY OPEN  1978  . COLONOSCOPY  07-2001   mild diverticulosis  . ESOPHAGOGASTRODUODENOSCOPY  6767,20-94   H Hernia,es.stricture s/p dil 38F  . INTRAOCULAR LENS INSERTION Bilateral   . LIVER BIOPSY  [phone]  . PACEMAKER IMPLANT N/A 01/14/2020   Procedure: PACEMAKER IMPLANT;  Surgeon: [place] Lance, MD;  Location: [place] [facility];  Service: Cardiovascular;  Laterality: N/A;  . PARATHYROID EXPLORATION    . TONSILLECTOMY AND ADENOIDECTOMY    . TOTAL ABDOMINAL HYSTERECTOMY    . ULNAR NERVE TRANSPOSITION  12/07/2011   Procedure: ULNAR NERVE DECOMPRESSION/TRANSPOSITION;this was cancelled-not done  Surgeon: Cammie Sickle., MD;  Location: [facility];  Service: Orthopedics;  Laterality: Right;  right ulnar nerve in situ decompression  . ULNAR TUNNEL RELEASE  03/07/2012   Procedure: CUBITAL TUNNEL RELEASE;  Surgeon: Roseanne Kaufman, MD;  Location: [place];  Service: Orthopedics;  Laterality: Right;  ulnar nerve release at the elbow      Social History   Socioeconomic History  . Marital status: Single    Spouse name: Not on file  . Number of children: 0  . Years of education: Not on file  . Highest education level: Not on file  Occupational History  . Occupation: Retired   Tobacco Use  . Smoking status: Former Smoker    Types: Cigarettes    Quit date: 05/08/1988    Years since quitting: 31.7  . Smokeless tobacco: Never Used  Vaping Use  . Vaping Use: Never used  Substance and Sexual Activity  . Alcohol use: No    Alcohol/week: 0.0 standard drinks  . Drug use: No  . Sexual activity: Not Currently  Other Topics Concern  . Not on file  Social History Narrative  . Not on file   Social Determinants of Health   Financial Resource Strain:   . Difficulty of Paying Living Expenses: Not on file  Food Insecurity:   . Worried About Charity fundraiser in the Last Year: Not on  file  . Ran Out of Food in the Last Year: Not on file  Transportation Needs:   . Lack of Transportation (Medical): Not on file  . Lack of Transportation (Non-Medical): Not on file  Physical Activity:   . Days of Exercise per Week: Not on file  . Minutes of Exercise per Session: Not on file  Stress:   . Feeling of Stress : Not on file  Social Connections:   . Frequency of Communication with Friends and Family: Not on file  . Frequency of Social Gatherings with Friends and Family: Not on file  . Attends Religious Services: Not on file  . Active Member of Clubs or Organizations: Not on file  . Attends Archivist Meetings: Not on file  . Marital Status: Not on file    reports that she quit smoking about 31 years ago. Her smoking use included cigarettes. She has never used smokeless tobacco. She reports that she does not drink alcohol and does not use drugs.  Functional Status Survey:    Family History  Problem Relation Age of Onset  . Heart disease Father   . Lung cancer Mother        small cell;Byssinosis  . Lung cancer Sister   . Liver cancer Sister        ? mets from another area of the body  . Diabetes Other        grandmother  . Stroke Maternal Grandfather     Health Maintenance  Topic Date Due  . TETANUS/TDAP  Never done  . DEXA SCAN  Never done  . FOOT EXAM  02/14/2017  . OPHTHALMOLOGY EXAM  12/28/2018  . INFLUENZA VACCINE  12/07/2019  . HEMOGLOBIN A1C  07/13/2020  . COVID-19 Vaccine  Completed  . PNA vac Low Risk Adult  Completed    Allergies  Allergen Reactions  . Chlordiazepoxide-Clidinium Other (See Comments)    Sleepy,weak  . Biaxin [Clarithromycin] Other (See Comments)    Foul taste, abd pain, diarrhea  . Tramadol Other (See Comments)    Caused tremors  . Codeine Other (See Comments)    REACTION: gi upset- only in high doses per pt.  Can tolerate cough syrup with codeine.     Outpatient Encounter Medications as of 01/27/2020  Medication  Sig  . acetaminophen (TYLENOL)  325 MG tablet Take 2 tablets (650 mg total) by mouth every 6 (six) hours as needed for mild pain (or Fever >/= 101).  Marland Kitchen ALPRAZolam (XANAX) 0.5 MG tablet Take 0.5 tablets (0.25 mg total) by mouth as needed for anxiety or sleep.  Marland Kitchen aspirin 325 MG tablet Take 325 mg by mouth daily.    . bisacodyl (DULCOLAX) 10 MG suppository Place 10 mg rectally as needed for moderate constipation.  . Budeson-Glycopyrrol-Formoterol (BREZTRI AEROSPHERE) 160-9-4.8 MCG/ACT AERO Inhale 2 puffs into the lungs 2 (two) times daily.  Derrill Memo ON 02/02/2020] fluconazole (DIFLUCAN) 150 MG tablet Take 150 mg by mouth daily. Take 1 tablet by mouth x 1 dose, then repeat 150 mg tablet one by mouth x 1 dose week for candidiasis  . insulin degludec (TRESIBA FLEXTOUCH) 200 UNIT/ML FlexTouch Pen Inject 30 Units into the skin daily.  Marland Kitchen ipratropium-albuterol (DUONEB) 0.5-2.5 (3) MG/3ML SOLN 1 vial in neb every 8 hours and as needed  . irbesartan (AVAPRO) 75 MG tablet Take 1 tablet (75 mg total) by mouth daily.  Marland Kitchen levalbuterol (XOPENEX HFA) 45 MCG/ACT inhaler Inhale 1-2 puffs into the lungs every 6 (six) hours as needed for wheezing.  . magic mouthwash SOLN Swish with 2 teaspoonfuls by mouth as needed  . Magnesium Hydroxide (MILK OF MAGNESIA PO) Take 30 mLs by mouth as needed.  . metoprolol succinate (TOPROL-XL) 50 MG 24 hr tablet Take 1 tablet (50 mg total) by mouth daily. Take with or immediately following a meal.  . nystatin cream (MYCOSTATIN) Apply 1 application topically 2 (two) times daily.  Marland Kitchen omeprazole (PRILOSEC) 20 MG capsule TAKE (1) CAPSULE DAILY.  Marland Kitchen polyethylene glycol (MIRALAX / GLYCOLAX) 17 g packet Take 17 g by mouth daily as needed.  . pravastatin (PRAVACHOL) 20 MG tablet Take 1 tablet (20 mg total) by mouth daily.  . sodium chloride HYPERTONIC 3 % nebulizer solution 1 ampule neb every 6 hours if needed to clear mucus  . Sodium Phosphates (RA SALINE ENEMA RE) Place rectally as needed.  Marland Kitchen  STIOLTO RESPIMAT 2.5-2.5 MCG/ACT AERS USE 2 PUFFS DAILY   No facility-administered encounter medications on file as of 01/27/2020.    Review of Systems  Constitutional: Negative for chills, fever and malaise/fatigue.  HENT: Negative for congestion.   Eyes: Negative for blurred vision.  Respiratory: Negative for cough, sputum production, shortness of breath and wheezing.   Cardiovascular: Positive for leg swelling. Negative for chest pain and palpitations.  Gastrointestinal: Positive for constipation and diarrhea. Negative for abdominal pain, blood in stool and melena.  Genitourinary: Negative for dysuria.  Musculoskeletal: Negative for falls.  Skin: Negative for itching and rash.  Neurological: Negative for dizziness and loss of consciousness.  Psychiatric/Behavioral: Positive for memory loss. Negative for depression. The patient is not nervous/anxious and does not have insomnia.        Very talkative, jumps from one topic to another--I was in her room for an hour    Vitals:   01/27/20 1442  BP: (!) 141/70  Pulse: 70  Temp: (!) 97.1 F (36.2 C)  SpO2: 99%  Weight: 178 lb (80.7 kg)  Height: 5' 3"  (1.6 m)   Body mass index is 31.53 kg/m. Physical Exam Vitals reviewed.  Constitutional:      General: She is not in acute distress.    Appearance: Normal appearance. She is obese. She is not ill-appearing or toxic-appearing.  HENT:     Head: Normocephalic and atraumatic.     Right Ear: External  ear normal.     Left Ear: External ear normal.     Nose: Nose normal.     Mouth/Throat:     Pharynx: Oropharynx is clear.  Eyes:     Extraocular Movements: Extraocular movements intact.     Pupils: Pupils are equal, round, and reactive to light.  Cardiovascular:     Rate and Rhythm: Normal rate and regular rhythm.  Pulmonary:     Effort: Pulmonary effort is normal.     Breath sounds: Normal breath sounds. No wheezing or rales.  Abdominal:     General: Bowel sounds are normal.      Palpations: Abdomen is soft.     Tenderness: There is no abdominal tenderness. There is no guarding or rebound.  Musculoskeletal:        General: Normal range of motion.     Cervical back: Neck supple.     Right lower leg: No edema.     Left lower leg: No edema.  Skin:    General: Skin is warm and dry.     Comments: steristrips remain over pacemaker placement site  Neurological:     General: No focal deficit present.     Mental Status: She is alert and oriented to person, place, and time.     Cranial Nerves: No cranial nerve deficit.     Sensory: No sensory deficit.     Motor: Weakness present.     Coordination: Coordination normal.     Comments: Oriented, but repeats some stories over the hour and all over the map  Psychiatric:        Mood and Affect: Mood normal.     Labs reviewed: Basic Metabolic Panel: Recent Labs    01/13/20 1603 01/14/20 0338 01/15/20 0708 01/15/20 0708 01/18/20 1727 01/20/20 0625 01/22/20 0425  NA  --    < > 139   < > 136 137 136  K  --    < > 4.3   < > 4.0 4.3 4.4  CL  --    < > 100   < > 94* 97* 96*  CO2  --    < > 30   < > 28 30 31   GLUCOSE  --    < > 172*   < > 250* 199* 255*  BUN  --    < > 21   < > 8 8 8   CREATININE 0.82   < > 0.72   < > 0.76 0.61 0.73  CALCIUM  --    < > 8.7*   < > 8.9 8.9 9.2  MG 1.7  --  2.3  --   --   --   --   PHOS 3.4  --  3.5  --   --   --   --    < > = values in this interval not displayed.   Liver Function Tests: Recent Labs    08/04/19 1106 01/13/20 1213 01/14/20 0338  AST 19 20 23   ALT 12 15 16   ALKPHOS 51 50 50  BILITOT 0.3 0.8 0.8  PROT 7.5 7.1 6.5  ALBUMIN 4.0 3.4* 3.1*   No results for input(s): LIPASE, AMYLASE in the last 8760 hours. No results for input(s): AMMONIA in the last 8760 hours. CBC: Recent Labs    08/04/19 1106 08/04/19 1106 01/13/20 1213 01/13/20 1226 01/18/20 1727 01/20/20 0625 01/22/20 0425  WBC 8.4   < > 10.0   < > 11.9* 9.1 8.6  NEUTROABS 3.7  --  8.0*  --   --   --    --   HGB 13.7   < > 12.5   < > 13.9 13.2 12.7  HCT 43.3   < > 41.0   < > 46.1* 44.1 40.9  MCV 83.0   < > 86.3   < > 86.8 87.8 87.4  PLT 166.0   < > 167   < > 206 140* 157   < > = values in this interval not displayed.   Cardiac Enzymes: No results for input(s): CKTOTAL, CKMB, CKMBINDEX, TROPONINI in the last 8760 hours. BNP: Invalid input(s): POCBNP Lab Results  Component Value Date   HGBA1C 9.8 (H) 01/14/2020   Lab Results  Component Value Date   TSH 1.970 01/13/2020   Lab Results  Component Value Date   VITAMINB12 255 01/20/2020   No results found for: FOLATE No results found for: IRON, TIBC, FERRITIN  Imaging and Procedures obtained prior to SNF admission: DG Chest 2 View  Result Date: 01/19/2020 CLINICAL DATA:  Generalized weakness. EXAM: CHEST - 2 VIEW COMPARISON:  January 15, 2020 FINDINGS: A dual lead AICD is noted. There is no evidence of acute infiltrate, pleural effusion or pneumothorax. The cardiac silhouette is mildly enlarged and unchanged in size. Marked severity calcification of the aortic arch is noted. The visualized skeletal structures are unremarkable. IMPRESSION: No active cardiopulmonary disease. Electronically Signed   By: Virgina Norfolk M.D.   On: 01/19/2020 01:15   CT Angio Chest PE W and/or Wo Contrast  Result Date: 01/19/2020 CLINICAL DATA:  Low to intermediate probability for pulmonary embolism. Positive D-dimer. EXAM: CT ANGIOGRAPHY CHEST WITH CONTRAST TECHNIQUE: Multidetector CT imaging of the chest was performed using the standard protocol during bolus administration of intravenous contrast. Multiplanar CT image reconstructions and MIPs were obtained to evaluate the vascular anatomy. CONTRAST:  97m OMNIPAQUE IOHEXOL 350 MG/ML SOLN COMPARISON:  None. FINDINGS: Cardiovascular: Normal heart size. No pericardial effusion. There is aortic and coronary atherosclerosis. No pulmonary artery filling defect Mediastinum/Nodes: Negative for adenopathy or mass.  Lungs/Pleura: The central airways are clear. Right posterior costophrenic sulcus opacity with volume loss attributed atelectasis. Minimal apical emphysema. Upper Abdomen: Lobulated liver raising the possibility of cirrhosis, also seen described on a 2018 CT. Cholecystectomy. No acute finding. Musculoskeletal: No acute or aggressive finding. Review of the MIP images confirms the above findings. IMPRESSION: 1. No acute finding, including pulmonary embolism. 2. Mild dependent atelectasis. 3. Aortic Atherosclerosis (ICD10-I70.0). Electronically Signed   By: JMonte FantasiaM.D.   On: 01/19/2020 05:01    Assessment/Plan 1. Acute on chronic respiratory failure with hypoxia (HCC) -resolved with reinstitution of oxygen and tx of reported UTI with rocephin x 3 days  -had been out of O2 at home by herself so crucial that she has a safer set up if she leaves here  2. Complete heart block (HCC) -s/p pacer, doing ok here  3. Status post placement of cardiac pacemaker -site looks good  4. Mild cognitive impairment with memory loss -seems she may actually have an early dementia, but it was hard to tell due to recent period of prolonged hypoxia that led to hospitalization and admission here -continue to monitor -we will need to know BIMS scores and nursing experience with memory prior to discharge to make educated decisions--at the hospital, she did not have capacity per psych and her friend, SFreda Munro was helping  5. Scoliosis due to degenerative disease of spine in adult patient -has been a long-term issue,  tylenol may be used or topical therapy if she has pain  6. Diabetic polyneuropathy associated with type 2 diabetes mellitus ([place]) -control has been poor -continue tresiba insulin -appears she needs regular help at home if her cognitive status does not improve considerably or a move to [place] Lab Results  Component Value Date   HGBA1C 9.8 (H) 01/14/2020    7. Anxiety -has used xanax at home -if  possible, favor weaning this as it worsens cognitive status  Family/ staff Communication:  Discussed with snf nurse  Labs/tests ordered:  Cbc, bmp at one week; need copies of memory testing here  Meiah Zamudio L. Eyana Stolze, D.O. [facility] [address], [place] [address] Cell Phone (Mon-Fri 8am-5pm):  [phone] On Call:  [phone] & follow prompts after 5pm & weekends Office Phone:  [phone] Office Fax:  [phone]

## 2020-01-27 NOTE — Patient Outreach (Signed)
Member screened for potential [facility] Care Management needs as a benefit of [place] [employer]. Information received from [facility] [place] Management hospital liaison.   Verified in Patient [place] that member resides at [facility].  Communication sent to [facility] to collaborate about transition plans and potential [facility] Care Management needs.   Marthenia Rolling, MSN-Ed, RN,BSN [place] Acute Care Coordinator [phone] [facility]) [phone]  (Toll free office)

## 2020-01-29 DIAGNOSIS — G3184 Mild cognitive impairment, so stated: Secondary | ICD-10-CM | POA: Insufficient documentation

## 2020-01-30 LAB — BASIC METABOLIC PANEL
BUN: 9 (ref 4–21)
CO2: 25 — AB (ref 13–22)
Chloride: 93 — AB (ref 99–108)
Creatinine: 0.6 (ref 0.5–1.1)
Glucose: 232
Potassium: 5.4 — AB (ref 3.4–5.3)
Sodium: 133 — AB (ref 137–147)

## 2020-01-30 LAB — COMPREHENSIVE METABOLIC PANEL
Calcium: 9.6 (ref 8.7–10.7)
GFR calc Af Amer: >90
GFR calc non Af Amer: 84.8

## 2020-01-30 LAB — CBC: RBC: 5.34 — AB (ref 3.87–5.11)

## 2020-01-30 LAB — CBC AND DIFFERENTIAL
HCT: 45 (ref 36–46)
Hemoglobin: 14.2 (ref 12.0–16.0)
Platelets: 155 (ref 150–399)
WBC: 9.7

## 2020-01-30 NOTE — Progress Notes (Signed)
Remote pacemaker transmission.   

## 2020-02-02 LAB — COMPREHENSIVE METABOLIC PANEL
Calcium: 9.6 (ref 8.7–10.7)
GFR calc Af Amer: >90
GFR calc non Af Amer: 86.74

## 2020-02-02 LAB — BASIC METABOLIC PANEL
BUN: 8 (ref 4–21)
CO2: 31 — AB (ref 13–22)
Chloride: 95 — AB (ref 99–108)
Creatinine: 0.6 (ref 0.5–1.1)
Glucose: 255
Potassium: 4.6 (ref 3.4–5.3)
Sodium: 134 — AB (ref 137–147)

## 2020-02-06 DIAGNOSIS — J069 Acute upper respiratory infection, unspecified: Secondary | ICD-10-CM | POA: Diagnosis not present

## 2020-02-09 ENCOUNTER — Non-Acute Institutional Stay (SKILLED_NURSING_FACILITY): Payer: Medicare Other | Admitting: Family

## 2020-02-09 ENCOUNTER — Encounter: Payer: Self-pay | Admitting: Family

## 2020-02-09 DIAGNOSIS — I442 Atrioventricular block, complete: Secondary | ICD-10-CM

## 2020-02-09 DIAGNOSIS — Z950 Presence of cardiac pacemaker: Secondary | ICD-10-CM

## 2020-02-09 DIAGNOSIS — K219 Gastro-esophageal reflux disease without esophagitis: Secondary | ICD-10-CM | POA: Diagnosis not present

## 2020-02-09 DIAGNOSIS — E782 Mixed hyperlipidemia: Secondary | ICD-10-CM

## 2020-02-09 DIAGNOSIS — J9611 Chronic respiratory failure with hypoxia: Secondary | ICD-10-CM

## 2020-02-09 DIAGNOSIS — I10 Essential (primary) hypertension: Secondary | ICD-10-CM | POA: Diagnosis not present

## 2020-02-09 DIAGNOSIS — E1142 Type 2 diabetes mellitus with diabetic polyneuropathy: Secondary | ICD-10-CM

## 2020-02-09 DIAGNOSIS — G3184 Mild cognitive impairment, so stated: Secondary | ICD-10-CM | POA: Diagnosis not present

## 2020-02-09 DIAGNOSIS — R2681 Unsteadiness on feet: Secondary | ICD-10-CM | POA: Diagnosis not present

## 2020-02-09 NOTE — Progress Notes (Signed)
Location:  [facility] Room Number: 509-T Place of Service:  SNF [phone])  Provider: Marlowe Sax FNP-C   PCP: Gayland Curry, DO Patient Care Team: Gayland Curry, DO as PCP - General (Geriatric Medicine) Raynelle Bring, MD as Consulting Physician (Urology) Barbaraann Faster, RN as [place] Management Marcelline Deist, Ranae Pila, LCSW as [place] Management Evans Lance, MD as Consulting Physician (Cardiology) Deneise Lever, MD as Consulting Physician (Pulmonary Disease)  Extended Emergency Contact Information Primary Emergency Contact: Larinda Buttery [place] Phone: [phone] Mobile Phone: [phone] Relation: Friend  Code Status: Full Code  Goals of care:  Advanced Directive information Advanced Directives 02/17/2020  Does Patient Have a Medical Advance Directive? Yes  Type of Advance Directive Living will  Does patient want to make changes to medical advance directive? -  Copy of [place] in Chart? -  Would patient like information on creating a medical advance directive? -  Pre-existing out of facility DNR order (yellow form or pink MOST form) -     Allergies  Allergen Reactions   Chlordiazepoxide-Clidinium Other (See Comments)    Sleepy,weak   Biaxin [Clarithromycin] Other (See Comments)    Foul taste, abd pain, diarrhea   Tramadol Other (See Comments)    Caused tremors   Codeine Other (See Comments)    REACTION: gi upset- only in high doses per pt.  Can tolerate cough syrup with codeine.     Chief Complaint  Patient presents with   Discharge Note    Discharge from SNF to home 02/10/2020.    HPI:  84 y.o. female seen today at [facility] for discharge home 02/10/2020.She was here for short term rehabilitation for post hospital admission at [facility] from  01/18/2020 - 01/23/2020 for failure to thrive after her  daughter called [facility] for her welfare check and was found sitting on her chair with oxygen saturations in the 85 % on oxygen via nasal canula.she was evaluated in the ED.HR in the 50's-120's,respiration 18-32 and B/p was 125/48 -160/70.she had leukocytosis with WBC 11.9,D-dimer 3.71.Elevated troponin was thought due to ischemia demand.her U/A was positive CTA chest was negative for PE or PNA.CT of the brain indicated atrophy.she was treated with I.V metoprolol 5 mg,50 mg oral Toprol and I.V Rocephin.she was admitted.Her delirium was thought to be due to hypoxemia and UTI.Her oxygen stabilized.she was evaluated by Psychiatry She was seen by therapy and SNF was recommended.she has a medical history of Hypertension,complete Heart block status post pacemaker,Hyperlipidemia,Type 2 DM,chronic respiratory failure with hypoxia COPD,Fatty Liver disease,mixed IBW with diarrhea predominant,Bening positional vertigo,Lumbar spinal stenosis,Scoliosis among other conditions.she is seen in her room awake in bed.she denies any acute issues today.Her CBG log reviewed readings in the 190's- low 200's.No fall or recent acute illness while here in rehab.     She has worked well with PT/OT now stable for discharge home.She will be discharged home with Home health PT/OT to continue with ROM, Exercise, Gait stability and muscle strengthening.She will also require [place] for pace maker and previous pressure ulcer site care.No new DME required. Home health services will be arranged by facility social worker prior to discharge. Prescription medication will be written x 1 month then patient to follow up with PCP in 1-2 weeks.she continues to follow up with Cardiologist Dr.Gregg as directed. Facility staff report no new concerns.    Past  Medical History:  Diagnosis Date   Acute and chronic respiratory failure with hypoxia (HCC)    Allergic rhinitis, cause unspecified    Sinus CT Rec 12-23-2009   Anxiety  disorder, unspecified    Arthritis    Atrioventricular block, complete (HCC)    Benign paroxysmal positional vertigo 08/09/2012   Chronic airway obstruction, not elsewhere classified    HFA 75-90% after coaching 12-23-2009   Chronic obstructive pulmonary disease, unspecified ([place])    Chronic rhinitis    Cognitive communication deficit    Complication of anesthesia    takes along time to wake up   Constipation, unspecified    COPD mixed type ([place])    Diabetes mellitus    Diarrhea    Diverticulosis    Dizziness and giddiness    Elevated troponin    Esophageal reflux    Esophageal stricture    Essential (primary) hypertension    Gastro-esophageal reflux disease without esophagitis    GERD (gastroesophageal reflux disease)    Glaucoma    Hiatal hernia    History of urinary tract infection    Hyperlipidemia, unspecified    Hypertension    Irritable bowel syndrome    Leukocytosis    Metabolic encephalopathy    Muscle weakness (generalized)    Neuropathy in diabetes ([place]) 08/09/2012   Obesity, unspecified    Oth speech/lang deficits following oth cerebvasc disease    Other abnormalities of gait and mobility    Other chronic nonalcoholic liver disease    Other forms of scoliosis, site unspecified    Other specified diseases of liver    Oxygen deficiency    Presence of permanent cardiac pacemaker 01/14/2020   Sciatica    Scoliosis    Shingles 2010   Shortness of breath    Spinal stenosis, lumbar region, without neurogenic claudication    Steatohepatitis    Thrombocytopenia, unspecified ([place])    Type 2 diabetes mellitus with diabetic polyneuropathy ([place])    Type 2 diabetes mellitus with other diabetic neurological complication ([place])    Unsteadiness on feet     Past Surgical History:  Procedure Laterality Date   APPENDECTOMY     CARPAL TUNNEL RELEASE     right hand   CHOLECYSTECTOMY OPEN  1978   COLONOSCOPY  07-2001   mild  diverticulosis   ESOPHAGOGASTRODUODENOSCOPY  4166,06-30   H Hernia,es.stricture s/p dil 34F   INTRAOCULAR LENS INSERTION Bilateral    LIVER BIOPSY  [id number]   PACEMAKER IMPLANT N/A 01/14/2020   Procedure: PACEMAKER IMPLANT;  Surgeon: Evans Lance, MD;  Location: [facility];  Service: Cardiovascular;  Laterality: N/A;   PARATHYROID EXPLORATION     TONSILLECTOMY AND ADENOIDECTOMY     TOTAL ABDOMINAL HYSTERECTOMY     ULNAR NERVE TRANSPOSITION  12/07/2011   Procedure: ULNAR NERVE DECOMPRESSION/TRANSPOSITION;this was cancelled-not done  Surgeon: Cammie Sickle., MD;  Location: [facility];  Service: Orthopedics;  Laterality: Right;  right ulnar nerve in situ decompression   ULNAR TUNNEL RELEASE  03/07/2012   Procedure: CUBITAL TUNNEL RELEASE;  Surgeon: Roseanne Kaufman, MD;  Location: [facility];  Service: Orthopedics;  Laterality: Right;  ulnar nerve release at the elbow        reports that she quit smoking about 31 years ago. Her smoking use included cigarettes. She has never used smokeless tobacco. She reports that she does not drink alcohol and does not use drugs. Social History   Socioeconomic History   Marital status:  Single    Spouse name: Not on file   Number of children: 0   Years of education: Not on file   Highest education level: Not on file  Occupational History   Occupation: Retired   Tobacco Use   Smoking status: Former Smoker    Types: Cigarettes    Quit date: 05/08/1988    Years since quitting: 31.8   Smokeless tobacco: Never Used  Scientific laboratory technician Use: Never used  Substance and Sexual Activity   Alcohol use: No    Alcohol/week: 0.0 standard drinks   Drug use: No   Sexual activity: Not Currently  Other Topics Concern   Not on file  Social History Narrative   Not on file   Social Determinants of Health   Financial Resource Strain:    Difficulty of Paying Living Expenses: Not on file  Food  Insecurity: No Food Insecurity   Worried About [place] in the Last Year: Never true   [place] in the Last Year: Never true  Transportation Needs: No Transportation Needs   Lack of Transportation (Medical): No   Lack of Transportation (Non-Medical): No  Physical Activity:    Days of Exercise per Week: Not on file   Minutes of Exercise per Session: Not on file  Stress:    Feeling of Stress : Not on file  Social Connections: Moderately Isolated   Frequency of Communication with Friends and Family: More than three times a week   Frequency of Social Gatherings with Friends and Family: More than three times a week   Attends Religious Services: More than 4 times per year   Active Member of [employer] or Organizations: No   Attends Music therapist: Patient refused   Marital Status: Divorced  Human resources officer Violence:    Fear of Current or Ex-Partner: Not on file   Emotionally Abused: Not on file   Physically Abused: Not on file   Sexually Abused: Not on file    Allergies  Allergen Reactions   Chlordiazepoxide-Clidinium Other (See Comments)    Sleepy,weak   Biaxin [Clarithromycin] Other (See Comments)    Foul taste, abd pain, diarrhea   Tramadol Other (See Comments)    Caused tremors   Codeine Other (See Comments)    REACTION: gi upset- only in high doses per pt.  Can tolerate cough syrup with codeine.     Pertinent  Health Maintenance Due  Topic Date Due   DEXA SCAN  Never done   FOOT EXAM  02/14/2017   OPHTHALMOLOGY EXAM  12/28/2018   INFLUENZA VACCINE  12/07/2019   HEMOGLOBIN A1C  07/13/2020   PNA vac Low Risk Adult  Completed    Medications: Outpatient Encounter Medications as of 02/09/2020  Medication Sig   acetaminophen (TYLENOL) 325 MG tablet Take 2 tablets (650 mg total) by mouth every 6 (six) hours as needed for mild pain (or Fever >/= 101).   aspirin 325 MG tablet Take 325 mg by mouth daily.     bisacodyl  (DULCOLAX) 10 MG suppository Place 10 mg rectally as needed for moderate constipation.   insulin degludec (TRESIBA FLEXTOUCH) 200 UNIT/ML FlexTouch Pen Inject 30 Units into the skin daily.   ipratropium-albuterol (DUONEB) 0.5-2.5 (3) MG/3ML SOLN 1 vial in neb every 8 hours and as needed   irbesartan (AVAPRO) 75 MG tablet Take 1 tablet (75 mg total) by mouth daily.   levalbuterol (XOPENEX HFA) 45 MCG/ACT inhaler Inhale 1-2 puffs into  the lungs every 6 (six) hours as needed for wheezing.   magic mouthwash SOLN Swish with 2 teaspoonfuls by mouth as needed   metoprolol succinate (TOPROL-XL) 50 MG 24 hr tablet Take 1 tablet (50 mg total) by mouth daily. Take with or immediately following a meal.   nystatin cream (MYCOSTATIN) Apply 1 application topically 2 (two) times daily.   omeprazole (PRILOSEC) 20 MG capsule TAKE (1) CAPSULE DAILY.   polyethylene glycol (MIRALAX / GLYCOLAX) 17 g packet Take 17 g by mouth daily as needed.   pravastatin (PRAVACHOL) 20 MG tablet Take 1 tablet (20 mg total) by mouth daily.   sodium chloride HYPERTONIC 3 % nebulizer solution 1 ampule neb every 6 hours if needed to clear mucus   Tiotropium Bromide-Olodaterol (STIOLTO RESPIMAT) 2.5-2.5 MCG/ACT AERS USE 2 PUFFS DAILY   [DISCONTINUED] ALPRAZolam (XANAX) 0.5 MG tablet Take 0.5 tablets (0.25 mg total) by mouth as needed for anxiety or sleep.   [DISCONTINUED] insulin degludec (TRESIBA FLEXTOUCH) 200 UNIT/ML FlexTouch Pen Inject 30 Units into the skin daily.   [DISCONTINUED] ipratropium-albuterol (DUONEB) 0.5-2.5 (3) MG/3ML SOLN 1 vial in neb every 8 hours and as needed   [DISCONTINUED] irbesartan (AVAPRO) 75 MG tablet Take 1 tablet (75 mg total) by mouth daily.   [DISCONTINUED] levalbuterol (XOPENEX HFA) 45 MCG/ACT inhaler Inhale 1-2 puffs into the lungs every 6 (six) hours as needed for wheezing.   [DISCONTINUED] Magnesium Hydroxide (MILK OF MAGNESIA PO) Take 30 mLs by mouth as needed.   [DISCONTINUED]  metoprolol succinate (TOPROL-XL) 50 MG 24 hr tablet Take 1 tablet (50 mg total) by mouth daily. Take with or immediately following a meal.   [DISCONTINUED] nystatin cream (MYCOSTATIN) Apply 1 application topically 2 (two) times daily.   [DISCONTINUED] omeprazole (PRILOSEC) 20 MG capsule TAKE (1) CAPSULE DAILY.   [DISCONTINUED] pravastatin (PRAVACHOL) 20 MG tablet Take 1 tablet (20 mg total) by mouth daily.   [DISCONTINUED] sodium chloride HYPERTONIC 3 % nebulizer solution 1 ampule neb every 6 hours if needed to clear mucus   [DISCONTINUED] Sodium Phosphates (RA SALINE ENEMA RE) Place rectally as needed.   [DISCONTINUED] STIOLTO RESPIMAT 2.5-2.5 MCG/ACT AERS USE 2 PUFFS DAILY   No facility-administered encounter medications on file as of 02/09/2020.     Review of Systems  Constitutional: Negative for appetite change, chills, fatigue and fever.  HENT: Negative for congestion, postnasal drip, rhinorrhea, sinus pressure, sinus pain, sneezing, sore throat and trouble swallowing.   Eyes: Negative for discharge, redness and itching.  Respiratory: Negative for cough, chest tightness, shortness of breath and wheezing.        On oxygen via nasal canula   Cardiovascular: Negative for chest pain and leg swelling.  Gastrointestinal: Negative for abdominal distention, abdominal pain, constipation, diarrhea, nausea and vomiting.  Endocrine: Negative for cold intolerance, heat intolerance, polydipsia, polyphagia and polyuria.  Genitourinary: Negative for difficulty urinating, dysuria, flank pain and urgency.  Musculoskeletal: Positive for gait problem. Negative for joint swelling and myalgias.  Skin: Negative for color change, pallor and rash.  Neurological: Negative for dizziness, speech difficulty, weakness, light-headedness and headaches.  Hematological: Does not bruise/bleed easily.  Psychiatric/Behavioral: Negative for agitation, behavioral problems and sleep disturbance. The patient is not  nervous/anxious.     Vitals:   02/09/20 1526  BP: 132/70  Pulse: 68  Resp: 18  Temp: (!) 96.8 F (36 C)  Weight: 178 lb (80.7 kg)  Height: 5' 3"  (1.6 m)   Body mass index is 31.53 kg/m. Physical Exam Vitals and  nursing note reviewed.  Constitutional:      General: She is not in acute distress.    Appearance: She is obese. She is not ill-appearing.  HENT:     Head: Normocephalic.     Nose: Nose normal. No congestion or rhinorrhea.     Mouth/Throat:     Mouth: Mucous membranes are moist.     Pharynx: Oropharynx is clear. No oropharyngeal exudate or posterior oropharyngeal erythema.  Eyes:     General: No scleral icterus.       Right eye: No discharge.        Left eye: No discharge.     Extraocular Movements: Extraocular movements intact.     Conjunctiva/sclera: Conjunctivae normal.     Pupils: Pupils are equal, round, and reactive to light.  Cardiovascular:     Rate and Rhythm: Normal rate and regular rhythm.     Pulses: Normal pulses.     Heart sounds: Normal heart sounds. No murmur heard.  No friction rub. No gallop.   Pulmonary:     Effort: Pulmonary effort is normal. No respiratory distress.     Breath sounds: Normal breath sounds. No wheezing, rhonchi or rales.     Comments: On continuous oxygen 4 liters via nasal canula  Chest:     Chest wall: No tenderness.  Abdominal:     General: Bowel sounds are normal. There is no distension.     Palpations: Abdomen is soft. There is no mass.     Tenderness: There is no abdominal tenderness. There is no right CVA tenderness, left CVA tenderness, guarding or rebound.  Musculoskeletal:        General: No swelling or tenderness.     Cervical back: Normal range of motion. No rigidity or tenderness.     Right lower leg: Edema present.     Left lower leg: Edema present.     Comments: Unsteady gait   Lymphadenopathy:     Cervical: No cervical adenopathy.  Skin:    General: Skin is warm.     Coloration: Skin is not pale.      Findings: No bruising, erythema or rash.     Comments: Left chest pacemaker site with steristrips dry,clean and intact surrounding skin without any swelling redness,tenderness or drainage  Previous pressure ulcer site progressive healing   Neurological:     Mental Status: She is alert and oriented to person, place, and time.     Cranial Nerves: No cranial nerve deficit.     Sensory: No sensory deficit.     Coordination: Coordination normal.     Gait: Gait abnormal.     Comments: Forgetful and repeats self   Psychiatric:        Mood and Affect: Mood normal.        Speech: Speech normal.        Behavior: Behavior normal.        Thought Content: Thought content normal.        Cognition and Memory: Memory is impaired.    Labs reviewed: Basic Metabolic Panel: Recent Labs    01/13/20 1603 01/14/20 0338 01/15/20 0708 01/15/20 0708 01/18/20 1727 01/18/20 1727 01/20/20 0625 01/20/20 0625 01/22/20 0425 01/30/20 0000 02/02/20 0000  NA  --    < > 139   < > 136   < > 137   < > 136 133* 134*  K  --    < > 4.3   < > 4.0   < > 4.3   < >  4.4 5.4* 4.6  CL  --    < > 100   < > 94*   < > 97*   < > 96* 93* 95*  CO2  --    < > 30   < > 28   < > 30   < > 31 25* 31*  GLUCOSE  --    < > 172*   < > 250*  --  199*  --  255*  --   --   BUN  --    < > 21   < > 8   < > 8   < > 8 9 8   CREATININE 0.82   < > 0.72   < > 0.76   < > 0.61   < > 0.73 0.6 0.6  CALCIUM  --    < > 8.7*   < > 8.9   < > 8.9   < > 9.2 9.6 9.6  MG 1.7  --  2.3  --   --   --   --   --   --   --   --   PHOS 3.4  --  3.5  --   --   --   --   --   --   --   --    < > = values in this interval not displayed.   Liver Function Tests: Recent Labs    08/04/19 1106 01/13/20 1213 01/14/20 0338  AST 19 20 23   ALT 12 15 16   ALKPHOS 51 50 50  BILITOT 0.3 0.8 0.8  PROT 7.5 7.1 6.5  ALBUMIN 4.0 3.4* 3.1*   CBC: Recent Labs    08/04/19 1106 08/04/19 1106 01/13/20 1213 01/13/20 1226 01/18/20 1727 01/18/20 1727 01/20/20 0625  01/22/20 0425 01/30/20 0000  WBC 8.4   < > 10.0   < > 11.9*   < > 9.1 8.6 9.7  NEUTROABS 3.7  --  8.0*  --   --   --   --   --   --   HGB 13.7   < > 12.5   < > 13.9   < > 13.2 12.7 14.2  HCT 43.3   < > 41.0   < > 46.1*   < > 44.1 40.9 45  MCV 83.0   < > 86.3   < > 86.8  --  87.8 87.4  --   PLT 166.0   < > 167   < > 206   < > 140* 157 155   < > = values in this interval not displayed.   CBG: Recent Labs    01/22/20 1606 01/23/20 0654 01/23/20 1114  GLUCAP 246* 266* 247*    Procedures and Imaging Studies During Stay: CUP PACEART INCLINIC DEVICE CHECK  Result Date: 02/19/2020 Wound check appointment. Steri-strips removed. Wound without redness or edema. Incision edges approximated, wound well healed. Normal device function. Thresholds, sensing, and impedances consistent with implant measurements. Device programmed at 3.5V/auto capture programmed on for extra safety margin until 3 month visit. Histogram distribution appropriate for patient and level of activity. Unable to obtain AT/AF burden d/t fast path summary was not saved. Longest episodes was 1 minute 48 seconds, appeared AT with controlled VR. 1 episode of AF with controlled VR, 14 seconds, no EGM available, no OAC on file. No high ventricular rates noted. Patient educated about wound care, arm mobility, lifting restrictions. ROV with Dr. Lovena Le 04/20/20. Next home remote 04/14/20. Patient  advised to plug home remote monitor box in when she gets home. If she cant locate it, patient was given [employer] phone number. Education packet given as well as home remote monitor pack. Patient verbalized understanding.Lavenia Atlas, BSN, RN   Assessment/Plan:     1. Chronic respiratory failure with hypoxia (HCC) Status post Hospitalization as above. Breathing stable on oxygen via nasal canula. - continue breathing treatment as below.  - ipratropium-albuterol (DUONEB) 0.5-2.5 (3) MG/3ML SOLN; 1 vial in neb every 8 hours and as needed  Dispense:  90 mL; Refill: 0 - levalbuterol (XOPENEX HFA) 45 MCG/ACT inhaler; Inhale 1-2 puffs into the lungs every 6 (six) hours as needed for wheezing.  Dispense: 1 each; Refill: 0 - sodium chloride HYPERTONIC 3 % nebulizer solution; 1 ampule neb every 6 hours if needed to clear mucus  Dispense: 75 mL; Refill: 0 - Tiotropium Bromide-Olodaterol (STIOLTO RESPIMAT) 2.5-2.5 MCG/ACT AERS; USE 2 PUFFS DAILY  Dispense: 4 g; Refill: 4 CBC, BMP in 1-2 weeks PCP   2. Status post placement of cardiac pacemaker Site with steri-strips and no signs of infection. - Follow up with Cardiologist Dr. Carleene Overlie as directed.   3. Complete heart block (HCC) Asymptomatic. - Follow up with Cardiologist Dr. Carleene Overlie as directed.  4. Mild cognitive impairment with memory loss Continue with supportive care.   5. Diabetic polyneuropathy associated with type 2 diabetes mellitus (HCC) No latest Hgb A1C on chart for review.will defer to PCP  CBG not under controlled. - insulin degludec (TRESIBA FLEXTOUCH) 200 UNIT/ML FlexTouch Pen; Inject 30 Units into the skin daily.  Dispense: 5 mL; Refill: 0 - irbesartan (AVAPRO) 75 MG tablet; Take 1 tablet (75 mg total) by mouth daily.  Dispense: 30 tablet; Refill: 0 - metoprolol succinate (TOPROL-XL) 50 MG 24 hr tablet; Take 1 tablet (50 mg total) by mouth daily. Take with or immediately following a meal.  Dispense: 30 tablet; Refill: 0 - pravastatin (PRAVACHOL) 20 MG tablet; Take 1 tablet (20 mg total) by mouth daily.  Dispense: 30 tablet; Refill: 0  6. Essential hypertension B/p at goal. - continue on irbesartan   - irbesartan (AVAPRO) 75 MG tablet; Take 1 tablet (75 mg total) by mouth daily.  Dispense: 30 tablet; Refill: 0 CBC, BMP in 1-2 weeks PCP   7. Gastroesophageal reflux disease without esophagitis symptoms controlled on Omeprazole. H/H on chart stable.  - omeprazole (PRILOSEC) 20 MG capsule; TAKE (1) CAPSULE DAILY.  Dispense: 30 capsule; Refill: 0  8. Mixed hyperlipidemia No LDL  on chart for review will defer to PCP. - continue on Pravastatin and dietary modification.  - pravastatin (PRAVACHOL) 20 MG tablet; Take 1 tablet (20 mg total) by mouth daily.  Dispense: 30 tablet; Refill: 0  9.Abnormal Gait   Has worked well with PT/ OT. She will discharge home PT/OT to continue with ROM, Exercise, Gait stability and muscle strengthening. Fall and safety precautions.   Patient is being discharged with the following home health services:   -PT/OT for ROM, exercise, gait stability and muscle strengthening  -  HH RN for wound care management    Patient is being discharged with the following durable medical equipment:   - No New DME required.   Patient has been advised to f/u with their PCP in 1-2 weeks to for a transitions of care visit.Social services at their facility was responsible for arranging this appointment.  Pt was provided with adequate prescriptions of noncontrolled medications to reach the scheduled appointment.For controlled substances, a limited  supply was provided as appropriate for the individual patient. If the pt normally receives these medications from a pain clinic or has a contract with another physician, these medications should be received from that clinic or physician only).    Future labs/tests needed:  CBC, BMP in 1-2 weeks PCP

## 2020-02-11 ENCOUNTER — Telehealth: Payer: Self-pay | Admitting: Internal Medicine

## 2020-02-11 ENCOUNTER — Telehealth: Payer: Self-pay | Admitting: Endocrinology

## 2020-02-11 ENCOUNTER — Other Ambulatory Visit: Payer: Self-pay | Admitting: *Deleted

## 2020-02-11 DIAGNOSIS — I10 Essential (primary) hypertension: Secondary | ICD-10-CM

## 2020-02-11 NOTE — Telephone Encounter (Signed)
Returned call to pt.  Pt wanted to let me know she will be coming to her wound check appt.   Pt will continue current medications.

## 2020-02-11 NOTE — Telephone Encounter (Signed)
Patient would like to know whether or not she needs to continue taking her BP medication. She states she does not know the name of said medication. However, she will have the name of it ready when our office returns her call. Please advise.

## 2020-02-11 NOTE — Telephone Encounter (Signed)
Patient called her sugars are extremely high, she was in hospital yesterday, and she had to get a pacemaker put in. She says [name] cannot see her for primary care, but she wants to know if he can see her for diabetes? Ph# [phone]

## 2020-02-11 NOTE — Patient Outreach (Addendum)
[facility]N Post- Acute Care Coordinator follow up. Member screened for potential [facility] Care Management needs as a benefit of [place] Medicare.  Update received from [place] indicating member transitioned to home on 02/10/20.   Telephone call made to Ms. Steinberg. Patient identifiers confirmed. Ms. Hoselton endorses that she returned home on yesterday. States she cannot remember what home health agency she has. Reports she lives alone and has no family. Only a cat. States she has a car but no longer drives. States she would need assist with transportation, meals, and house keeping.   States she has an upcoming appointment with Dr. Mariea Clonts on Oct. 11th and will need transportation assistance.  Explained [place] Management services. Ms. Tootle is agreeable. Explained [facility] Care Management will not interfere or replace services provided by home health.   Discussed [facility] for home visits. Ms. Chopp declined. States she does not want to "waste anyone's time coming to my house. I do not like many people coming to my place."  Will make referral to [place] for complex case management and [facility] LCSW for transportation, meals, and community resources.  Member has high risk for readmission. Has history of DM, HLD, HTN, IBS, neuropathy.  Sent communication to SNF SW to inquire about home health arrangements.  Addendum: [place] [facility] SW reports home health agency is [facility].   Marthenia Rolling, MSN-Ed, RN,BSN [place] Acute Care Coordinator [phone] [facility]) [phone]  (Toll free office)

## 2020-02-12 ENCOUNTER — Other Ambulatory Visit: Payer: Self-pay | Admitting: *Deleted

## 2020-02-12 NOTE — Patient Outreach (Signed)
[facility] [facility]) Care Management  02/12/2020  KATHARINE ROCHEFORT [birth year] [id number]   CSW received referral on 02/11/2020 for outreach.  CSW attempted to reach pt by phone and was unsuccessful.  CSW will attempt a 2nd outreach call in 3-4 business days.  CSW will send pt an unsuccessful outreach letter as well.   Eduard Clos, MSW, [place] Worker  [place] [phone]

## 2020-02-13 NOTE — Telephone Encounter (Signed)
Spoke with patient.  She is going to see if her new PCP will manage her diabetes.  If PCP will not manage paitent would like to return as a diabetic patient.  Please advise.

## 2020-02-13 NOTE — Telephone Encounter (Signed)
We have dismissed her previously for disrespectful interactions with office staff.  I can see her only if she promises to be courteous on the telephone and otherwise in the future.

## 2020-02-16 ENCOUNTER — Ambulatory Visit: Payer: Medicare Other | Admitting: Internal Medicine

## 2020-02-16 NOTE — Telephone Encounter (Signed)
Advised patient that if PCP cannot manage diabetes that [name] is willing to see her.

## 2020-02-17 ENCOUNTER — Other Ambulatory Visit: Payer: Self-pay | Admitting: *Deleted

## 2020-02-17 ENCOUNTER — Encounter: Payer: Self-pay | Admitting: *Deleted

## 2020-02-17 NOTE — Patient Outreach (Signed)
[facility]) Care Management  02/17/2020  YESENNIA HIROTA 1935-05-10 [id number]   [facility] Telephone Assessment/Screen for post snf referral  Referral Date: 02/11/20 Referral Source:  Marthenia Rolling, [facility] RN CM Post acute coordinator Referral Reason: Please assign to [facility] RN CM for complex case management. [facility] LCSW for meals, transportation (PCP appt on 02/16/20) and community resources. Transitioned from [facility] on [facility]/10[facility]. Lives alone. High risk for readmission. Declined [facility]. Communication sent to SNF SW to inquire about home health agency. Please see writer's notes from 02/11/20. Please call with questions. Thank you. Marthenia Rolling, MSN-Ed, RN,BSN-[facility]N Post Acute Care [phone]   Insurance: [employer] Admission 01/20/20 to 9/44/96 acute metabolic encephalopathy,  9/7-9/10/21 for acute metabolic encephalopathy thought secondary to heart block and hypoxia with signs of Klebsiella UTI   Outreach attempt # 1 successful  Patient is able to verify HIPAA, DOB and address Reviewed and addressed referral to [facility] with patient Consent: [facility] RN CM reviewed [facility] services with patient. Patient gave verbal consent for services [facility] telephonic RN CM.  Post snf assessment Ms HADLEA FURUYA reports she is doing well since discharged home from [facility] skilled nursing facility (snf) except having a sore right arm since the therapy sessions She reports she is not receiving services from [place] but from her 2 friends Freda Munro and JJ ([employer] (CAP) staff who has been with her for 2-3 years. She reports they assist with  IADLs (instrumental Activities of Daily Living) to include house cleaning, errands etc She reports being independent with  Activities of daily living (ADLs)  She reports not wanting to sleep in her bed but on her living room couch s she lives alone in a small apartment  Discussed and encouraged outreach to [facility] SW  She reports  having some itching at her pacemaker site but follows up with cardiology on 02/18/20. Pacemaker placed during recent admission 01/14/2020. She reports her friends will transport her using her car She is capable of driving but after experiencing lower "paralysis" she prefers not to drive to prevent an accident if these symptoms return   Questions were answered for her about causes of dry heaves, diarrhea and changes in taste She report having IBS and hx of fecal incontinence She denies causes of dry heaves to include anxiety, response to a smell or sight, low blood sugar, motion sickness, infection, overeating, & excessive alcohol consumption She considers dehydration GERD (gastroesophageal reflux disease) and possible medications (antidepressant, narcotics, antibiotics, insulin, metformin, anesthesia, cancer medications)  Sent EMMI IBS diet, metabolic encephalopathy, heart block in adults   Diabetes is managed well at home she reports elevations in the snf as she reports they did not provide her with coverage for cbg values of 300s She reports since she has been home she has not had elevated cbgs She reports she eats a high protein low carbohydrate diet   Social:  Mrs LUPITA ROSALES is a 84 year old female who lives alone except her cat, Fredderick Severance, in her one bedroom apartment. She has no support from family. She is divorced. There is a step brother in ? [place]. novolog  cbg none recent in 48s when 62 feel bad   Falls x 3 fell down steps then fell in home on knees after home repairs  Last fall was a fall landing on her right side walking out the post office and she was hit by a door   DME BP cuff walker , cane, and  oxygen by [employer] at home   Past Medical History:  Diagnosis Date  . Acute and chronic respiratory failure with hypoxia ([place])   . Allergic rhinitis, cause unspecified    Sinus CT Rec 12-23-2009  . Anxiety disorder, unspecified   . Arthritis   . Atrioventricular block,  complete ([place])   . Benign paroxysmal positional vertigo 08/09/2012  . 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 Chronic obstructive pulmonary disease, unspecified ([place])   . Chronic rhinitis   . Cognitive communication deficit   . Complication of anesthesia    takes along time to wake up  . Constipation, unspecified   . COPD mixed type ([place])   . Diabetes mellitus   . Diarrhea   . Diverticulosis   . Dizziness and giddiness   . Elevated troponin   . Esophageal reflux   . Esophageal stricture   . Essential (primary) hypertension   . Gastro-esophageal reflux disease without esophagitis   . GERD (gastroesophageal reflux disease)   . Glaucoma   . Hiatal hernia   . History of urinary tract infection   . Hyperlipidemia, unspecified   . Hypertension   . Irritable bowel syndrome   . Leukocytosis   . Metabolic encephalopathy   . Muscle weakness (generalized)   . Neuropathy in diabetes ([place]) 08/09/2012  . Obesity, unspecified   . Oth speech/lang deficits following oth cerebvasc disease   . Other abnormalities of gait and mobility   . Other chronic nonalcoholic liver disease   . Other forms of scoliosis, site unspecified   . Other specified diseases of liver   . Oxygen deficiency   . Presence of permanent cardiac pacemaker 01/14/2020  . Sciatica   . Scoliosis   . Shingles 2010  . Shortness of breath   . Spinal stenosis, lumbar region, without neurogenic claudication   . Steatohepatitis   . Thrombocytopenia, unspecified ([place])   . Type 2 diabetes mellitus with diabetic polyneuropathy ([place])   . Type 2 diabetes mellitus with other diabetic neurological complication ([place])   . Unsteadiness on feet     DME: BP cuff walker , cane, and oxygen by [employer] at home  Appointments: 02/16/20 appointment changed to 03/01/20 pcp Tiffany Reed  03/03/20 clinton young 04/14/20 cardiology 04/20/20 Dr Lovena Le    Advance Directives: has a living  will   Consent: [place] reviewed [facility] services with patient. Patient gave verbal consent for services [facility] telephonic RN CM.   Plan: [facility] RN CM will follow up with Ms Draughon within the next 7-10 business days Pt encouraged to return a call to [facility] RN CM prn Routed note to MD   Goals Addressed              This Visit's Progress     Patient Stated   .  [facility]) Manage My Diet (pt-stated)   On track     Follow Up Date 02/26/20   - avoid foods that make my symptoms worse - eat meals at regular times - learn about special diets that can help my symptoms    Why is this important?   Changing what you eat and drink may help you to manage your condition.  It is important to take it slow, making 1 change at a time.  A dietitian is the best person to guide you.    Notes:     .  (THN) Monitor and Manage My Blood Sugar (pt-stated)  On track     Follow Up Date 02/26/20   - check blood sugar at prescribed times - check blood sugar if I feel it is too high or too low - take the blood sugar meter to all doctor visits    Why is this important?   Checking your blood sugar at home helps to keep it from getting very high or very low.  Writing the results in a diary or log helps the doctor know how to care for you.  Your blood sugar log should have the time, date and the results.  Also, write down the amount of insulin or other medicine that you take.  Other information, like what you ate, exercise done and how you were feeling, will also be helpful.     Notes:        Audia Amick L. Lavina Hamman, RN, BSN, [place] Coordinator Office number [phone] Mobile number [phone]  [facility] number [phone] Fax number [phone] '

## 2020-02-18 NOTE — Progress Notes (Signed)
Fabulous, thank you!!!

## 2020-02-18 NOTE — Patient Outreach (Signed)
[facility]) Care Management  02/18/2020  [name] 10/08/1935 [id number]   CSW made contact with pt who reports she has not been through her "pile of mail" to see if the resources mailed to her have arrived. Pt continues to have CAPS services with in home personal care, however, states she needs/wants more. CSW advised pt she can hire additional in-home assistance but the CAPS program is the maximum covered service.   Pt also reports she has had not much of an appetite.  CSW encouraged her to discuss with her PCP (visit on Thursday per pt).  CSW will route this note to PCP as well and plan a follow up outreach call in the next week.   Eduard Clos, MSW, [place] Worker  [place] [phone]

## 2020-02-19 ENCOUNTER — Other Ambulatory Visit: Payer: Self-pay

## 2020-02-19 ENCOUNTER — Ambulatory Visit (INDEPENDENT_AMBULATORY_CARE_PROVIDER_SITE_OTHER): Payer: Medicare Other | Admitting: Emergency Medicine

## 2020-02-19 DIAGNOSIS — I442 Atrioventricular block, complete: Secondary | ICD-10-CM

## 2020-02-19 LAB — CUP PACEART INCLINIC DEVICE CHECK
Battery Remaining Longevity: 121 mo
Battery Voltage: 3.05 V
Brady Statistic RA Percent Paced: 1.1 %
Brady Statistic RV Percent Paced: 99.15 %
Date Time Interrogation Session: 20211014160453
Implantable Lead Implant Date: 20210908
Implantable Lead Implant Date: 20210908
Implantable Lead Location: 753859
Implantable Lead Location: 753860
Implantable Pulse Generator Implant Date: 20210908
Lead Channel Impedance Value: 612.5 Ohm
Lead Channel Impedance Value: 675 Ohm
Lead Channel Pacing Threshold Amplitude: 0.5 V
Lead Channel Pacing Threshold Amplitude: 0.5 V
Lead Channel Pacing Threshold Amplitude: 0.75 V
Lead Channel Pacing Threshold Pulse Width: 0.4 ms
Lead Channel Pacing Threshold Pulse Width: 0.4 ms
Lead Channel Pacing Threshold Pulse Width: 0.5 ms
Lead Channel Sensing Intrinsic Amplitude: 3.5 mV
Lead Channel Sensing Intrinsic Amplitude: 7.8 mV
Lead Channel Setting Pacing Amplitude: 1 V
Lead Channel Setting Pacing Amplitude: 3.5 V
Lead Channel Setting Pacing Pulse Width: 0.5 ms
Lead Channel Setting Sensing Sensitivity: 2 mV
Pulse Gen Model: 2272
Pulse Gen Serial Number: 3856705

## 2020-02-19 NOTE — Progress Notes (Signed)
Wound check appointment. Steri-strips removed. Wound without redness or edema. Incision edges approximated, wound well healed. Normal device function. Thresholds, sensing, and impedances consistent with implant measurements. Device programmed at 3.5V/auto capture programmed on for extra safety margin until 3 month visit. Histogram distribution appropriate for patient and level of activity. Unable to obtain AT/AF burden d/t fast path summary was not saved. Longest episodes was 1 minute 48 seconds, appeared AT with controlled VR. 1 episode of AF with controlled VR, 14 seconds, no EGM available, no OAC on file. No high ventricular rates noted. Patient educated about wound care, arm mobility, lifting restrictions. ROV with Dr. Lovena Le 04/20/20. Next home remote 04/14/20. Patient advised to plug home remote monitor box in when she gets home. If she cant locate it, patient was given [employer] phone number. Education packet given as well as home remote monitor pack. Patient verbalized understanding.

## 2020-02-19 NOTE — Patient Instructions (Signed)
Please plug your remote monitor in when you get home. If you are unable to locate it please call [place] [phone]. Called the device clinic [phone] if you need any further questions or concerns. Verbalized understanding.

## 2020-02-20 ENCOUNTER — Other Ambulatory Visit: Payer: Self-pay | Admitting: *Deleted

## 2020-02-20 NOTE — Patient Outreach (Signed)
[facility]) Care Management  02/20/2020  MYRTHA TONKOVICH 08-19-1935 [id number]   CSW spoke with pt who states she went to see one of her doctors yesterday.  "They went over everything that happened to me, told me I was lucky I didn't die".  Pt continues to have the help of her CAPS workers who provide in-home assistance as well as transporting her to appointments, etc.  Pt would like to hire more help in the home and is reminded that [place] mailed resources to her for private duty care agency's as well as other community services.   Pt plans to see her PCP on 03/01/2020. She denies any current concerns or needs aside from getting more help.  CSW will touch base with pt again next week to further discuss and confirm receiving info mailed.    Eduard Clos, MSW, [place] Worker  [place] [phone]

## 2020-02-22 MED ORDER — SODIUM CHLORIDE 3 % IN NEBU: INHALATION_SOLUTION | RESPIRATORY_TRACT | 0 refills | Status: DC

## 2020-02-22 MED ORDER — IRBESARTAN 75 MG PO TABS: 75.0000 mg | ORAL_TABLET | Freq: Every day | ORAL | 0 refills | Status: DC

## 2020-02-22 MED ORDER — STIOLTO RESPIMAT 2.5-2.5 MCG/ACT IN AERS: INHALATION_SPRAY | RESPIRATORY_TRACT | 4 refills | Status: DC

## 2020-02-22 MED ORDER — OMEPRAZOLE 20 MG PO CPDR: DELAYED_RELEASE_CAPSULE | ORAL | 0 refills | Status: DC

## 2020-02-22 MED ORDER — PRAVASTATIN SODIUM 20 MG PO TABS: 20.0000 mg | ORAL_TABLET | Freq: Every day | ORAL | 0 refills | Status: DC

## 2020-02-22 MED ORDER — LEVALBUTEROL TARTRATE 45 MCG/ACT IN AERO: 1.0000 | INHALATION_SPRAY | Freq: Four times a day (QID) | RESPIRATORY_TRACT | 0 refills | Status: DC | PRN

## 2020-02-22 MED ORDER — NYSTATIN 100000 UNIT/GM EX CREA
1.0000 | TOPICAL_CREAM | Freq: Two times a day (BID) | CUTANEOUS | 0 refills | Status: DC
Start: 2020-02-22 — End: 2021-03-03

## 2020-02-22 MED ORDER — TRESIBA FLEXTOUCH 200 UNIT/ML ~~LOC~~ SOPN: 30.0000 [IU] | PEN_INJECTOR | Freq: Every day | SUBCUTANEOUS | 0 refills | Status: DC

## 2020-02-22 MED ORDER — METOPROLOL SUCCINATE ER 50 MG PO TB24: 50.0000 mg | ORAL_TABLET | Freq: Every day | ORAL | 0 refills | Status: DC

## 2020-02-22 MED ORDER — IPRATROPIUM-ALBUTEROL 0.5-2.5 (3) MG/3ML IN SOLN: RESPIRATORY_TRACT | 0 refills | Status: DC

## 2020-02-23 ENCOUNTER — Telehealth: Payer: Self-pay | Admitting: Internal Medicine

## 2020-02-23 NOTE — Telephone Encounter (Signed)
Returned call to Pt.  Advised to try claritin and also try Sarna (cream for itch)  Pt indicates understanding.

## 2020-02-23 NOTE — Telephone Encounter (Signed)
Patient states she has been experiencing extreme itchiness all over her body and she would like to know If there is a medication she can take to reduce the itchiness.

## 2020-02-23 NOTE — Telephone Encounter (Addendum)
Would recommend 2nd generation antihistamine like Allegra or Claritin rather than Benadryl since they are less sedating and less likely to cause confusion in elderly patients.

## 2020-02-23 NOTE — Telephone Encounter (Signed)
Patient following up

## 2020-02-26 ENCOUNTER — Ambulatory Visit: Payer: Self-pay | Admitting: *Deleted

## 2020-02-26 ENCOUNTER — Other Ambulatory Visit: Payer: Self-pay | Admitting: *Deleted

## 2020-02-26 NOTE — Patient Outreach (Signed)
[place] [facility]) Care Management  02/26/2020  [name] 03/19/1936 [id number]  CSW spoke with pt who reports she has not seen any mail from [facility] with Community education officer.  CSW will mail info again and will ask [facility] RNCM to confirm receipt of this on upcoming outreach calls.  Pt has CAPS services in the home assisting up to 40 hours per week.  They also assist with transportation, errands and in home personal care/housekeeping, etc.  Pt is not certain she would want Meals on Wheels; stating, "people have gotten killed by the deliverers".  CSW will mail info to pt and plan to sign off. CSW will update [facility] team and PCP.  Please re-consult CSW  If further needs arise.   Eduard Clos, MSW, [place] Worker  [place] [phone]

## 2020-03-01 ENCOUNTER — Encounter: Payer: Self-pay | Admitting: Internal Medicine

## 2020-03-01 ENCOUNTER — Telehealth: Payer: Self-pay

## 2020-03-01 ENCOUNTER — Ambulatory Visit (INDEPENDENT_AMBULATORY_CARE_PROVIDER_SITE_OTHER): Payer: Medicare Other | Admitting: Internal Medicine

## 2020-03-01 ENCOUNTER — Other Ambulatory Visit: Payer: Self-pay

## 2020-03-01 VITALS — BP 138/72 | HR 82 | Temp 97.7°F | Ht 63.0 in | Wt 166.2 lb

## 2020-03-01 DIAGNOSIS — G3184 Mild cognitive impairment, so stated: Secondary | ICD-10-CM | POA: Diagnosis not present

## 2020-03-01 DIAGNOSIS — Z23 Encounter for immunization: Secondary | ICD-10-CM | POA: Diagnosis not present

## 2020-03-01 DIAGNOSIS — E782 Mixed hyperlipidemia: Secondary | ICD-10-CM

## 2020-03-01 DIAGNOSIS — M4180 Other forms of scoliosis, site unspecified: Secondary | ICD-10-CM | POA: Diagnosis not present

## 2020-03-01 DIAGNOSIS — I442 Atrioventricular block, complete: Secondary | ICD-10-CM | POA: Diagnosis not present

## 2020-03-01 DIAGNOSIS — Z950 Presence of cardiac pacemaker: Secondary | ICD-10-CM | POA: Diagnosis not present

## 2020-03-01 DIAGNOSIS — E1142 Type 2 diabetes mellitus with diabetic polyneuropathy: Secondary | ICD-10-CM

## 2020-03-01 DIAGNOSIS — J9611 Chronic respiratory failure with hypoxia: Secondary | ICD-10-CM

## 2020-03-01 DIAGNOSIS — M4150 Other secondary scoliosis, site unspecified: Secondary | ICD-10-CM

## 2020-03-01 MED ORDER — TRESIBA FLEXTOUCH 200 UNIT/ML ~~LOC~~ SOPN: 44.0000 [IU] | PEN_INJECTOR | Freq: Every day | SUBCUTANEOUS | 0 refills | Status: DC

## 2020-03-01 NOTE — Telephone Encounter (Signed)
Called patient and explained to her that Dr. Mariea Clonts had recommended Cerave cream dily after bathing. Patient stated that she would inform drug store that she needed it when they bring her other medications. She had no further questions.

## 2020-03-01 NOTE — Telephone Encounter (Signed)
Patient stated she came in to see Dr. Mariea Clonts this morning and she discussed itching of her skin this morning and was supposed to have something prescribed. Patient said she forgot to get the prescription. Patient uses [facility]. Please advise.

## 2020-03-01 NOTE — Telephone Encounter (Signed)
It was not a prescription cream--I recommended she use cerave cream on her back daily after bathing.  It's otc.

## 2020-03-01 NOTE — Patient Instructions (Signed)
Try using cerave cream on your back for the itching each day after bathing.

## 2020-03-01 NOTE — Progress Notes (Signed)
Location:  [facility] clinic Provider:  Nola Botkins L. Mariea Clonts, D.O., C.M.D.  Code Status: full code Goals of Care:  Advanced Directives 03/01/2020  Does Patient Have a Medical Advance Directive? Yes  Type of Advance Directive Living will  Does patient want to make changes to medical advance directive? No - Patient declined  Copy of [place] in Chart? -  Would patient like information on creating a medical advance directive? -  Pre-existing out of facility DNR order (yellow form or pink MOST form) -     Chief Complaint  Patient presents with  . Establish Care    New patient to establish care   . Health Maintenance    Tetanus/ Tdap, foot exam, dexa scan and influenza     HPI: Patient is a 84 y.o. female seen today for medical management of chronic diseases--to establish care here.    Saw  NP or PA at cardiology but hasn't seen Dr. Lovena Le yet.    She fell on her front step at one time and hurt her left behind.  Later her clothes started sliding around her body.  Her seam would be moved all the way around.  She has two caregivers.  She saw [facility] and they "didn't know what was wrong" and [facility] said she has scoliosis and he couldn't operate b/c of her lungs.  She can hardly use her right arm.  Had surgery with Dr. Amedeo Plenty for it.  It has spasms in it.  Dr. Sharol Given puts shots in her left shoulder.    She follows with [facility] for her bladder infections--Dr. Alinda Money.  Sounds like she got a yeast infection after too many abx.  She stopped the abx.  She "spent a small fortune" trying to cure the itching.  Says her urine is still cloudy.  Has had trouble with infections since 1970.    She had some testing done on her memory at [facility].    BP was good today.    Lives in apt.  Has handicapped parking.  Has a few steps going up to the front.  When she got home from rehab, the mother and 4 babies were left on her porch.   She has a cat of her own.      Had been seeing Dr. Dwyane Dee previously for both primary care and endocrine and he's no longer doing primary care.    Does not have bottom teeth--had them removed and hasn't been back to get her partial b/c of being sick.  .    Past Medical History:  Diagnosis Date  . Acute and chronic respiratory failure with hypoxia ([place])   . Allergic rhinitis, cause unspecified    Sinus CT Rec 12-23-2009  . Anxiety disorder, unspecified   . Arthritis   . Atrioventricular block, complete ([place])   . Benign paroxysmal positional vertigo 08/09/2012  . 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 Chronic obstructive pulmonary disease, unspecified ([place])   . Chronic rhinitis   . Cognitive communication deficit   . Complication of anesthesia    takes along time to wake up  . Constipation, unspecified   . COPD mixed type ([place])   . Diabetes mellitus   . Diarrhea   . Diverticulosis   . Dizziness and giddiness   . Elevated troponin   . Esophageal reflux   . Esophageal stricture   . Essential (primary) hypertension   . Gastro-esophageal reflux  disease without esophagitis   . GERD (gastroesophageal reflux disease)   . Glaucoma   . Hiatal hernia   . History of urinary tract infection   . Hyperlipidemia, unspecified   . Hypertension   . Irritable bowel syndrome   . Leukocytosis   . Metabolic encephalopathy   . Muscle weakness (generalized)   . Neuropathy in diabetes ([place]) 08/09/2012  . Obesity, unspecified   . Oth speech/lang deficits following oth cerebvasc disease   . Other abnormalities of gait and mobility   . Other chronic nonalcoholic liver disease   . Other forms of scoliosis, site unspecified   . Other specified diseases of liver   . Oxygen deficiency   . Presence of permanent cardiac pacemaker 01/14/2020  . Sciatica   . Scoliosis   . Shingles 2010  . Shortness of breath   . Spinal stenosis, lumbar region, without neurogenic claudication    . Steatohepatitis   . Thrombocytopenia, unspecified ([place])   . Type 2 diabetes mellitus with diabetic polyneuropathy ([place])   . Type 2 diabetes mellitus with other diabetic neurological complication ([place])   . Unsteadiness on feet     Past Surgical History:  Procedure Laterality Date  . APPENDECTOMY    . CARPAL TUNNEL RELEASE     right hand  . CHOLECYSTECTOMY OPEN  1978  . COLONOSCOPY  07-2001   mild diverticulosis  . ESOPHAGOGASTRODUODENOSCOPY  1093,23-55   H Hernia,es.stricture s/p dil 55F  . INTRAOCULAR LENS INSERTION Bilateral   . LIVER BIOPSY  [phone]  . PACEMAKER IMPLANT N/A 01/14/2020   Procedure: PACEMAKER IMPLANT;  Surgeon: Evans Lance, MD;  Location: [facility];  Service: Cardiovascular;  Laterality: N/A;  . PARATHYROID EXPLORATION    . TONSILLECTOMY AND ADENOIDECTOMY    . TOTAL ABDOMINAL HYSTERECTOMY    . ULNAR NERVE TRANSPOSITION  12/07/2011   Procedure: ULNAR NERVE DECOMPRESSION/TRANSPOSITION;this was cancelled-not done  Surgeon: Cammie Sickle., MD;  Location: [place];  Service: Orthopedics;  Laterality: Right;  right ulnar nerve in situ decompression  . ULNAR TUNNEL RELEASE  03/07/2012   Procedure: CUBITAL TUNNEL RELEASE;  Surgeon: Roseanne Kaufman, MD;  Location: [place];  Service: Orthopedics;  Laterality: Right;  ulnar nerve release at the elbow      Allergies  Allergen Reactions  . Chlordiazepoxide-Clidinium Other (See Comments)    Sleepy,weak  . Biaxin [Clarithromycin] Other (See Comments)    Foul taste, abd pain, diarrhea  . Tramadol Other (See Comments)    Caused tremors  . Codeine Other (See Comments)    REACTION: gi upset- only in high doses per pt.  Can tolerate cough syrup with codeine.     Outpatient Encounter Medications as of 03/01/2020  Medication Sig  . acetaminophen (TYLENOL) 325 MG tablet Take 2 tablets (650 mg total) by mouth every 6 (six) hours as needed for mild pain (or Fever >/= 101).  Marland Kitchen  aspirin 325 MG tablet Take 325 mg by mouth daily.    . insulin aspart (NOVOLOG) 100 UNIT/ML injection Inject into the skin 3 (three) times daily before meals.  . insulin degludec (TRESIBA FLEXTOUCH) 200 UNIT/ML FlexTouch Pen Inject 30 Units into the skin daily.  Marland Kitchen ipratropium-albuterol (DUONEB) 0.5-2.5 (3) MG/3ML SOLN 1 vial in neb every 8 hours and as needed  . irbesartan (AVAPRO) 75 MG tablet Take 1 tablet (75 mg total) by mouth daily.  Marland Kitchen levalbuterol (XOPENEX HFA) 45 MCG/ACT inhaler Inhale 1-2 puffs into the lungs every  6 (six) hours as needed for wheezing.  . magic mouthwash SOLN Swish with 2 teaspoonfuls by mouth as needed  . metoprolol succinate (TOPROL-XL) 50 MG 24 hr tablet Take 1 tablet (50 mg total) by mouth daily. Take with or immediately following a meal.  . nystatin cream (MYCOSTATIN) Apply 1 application topically 2 (two) times daily.  Marland Kitchen omeprazole (PRILOSEC) 20 MG capsule TAKE (1) CAPSULE DAILY.  Marland Kitchen polyethylene glycol (MIRALAX / GLYCOLAX) 17 g packet Take 17 g by mouth daily as needed.  . pravastatin (PRAVACHOL) 20 MG tablet Take 1 tablet (20 mg total) by mouth daily.  . sodium chloride HYPERTONIC 3 % nebulizer solution 1 ampule neb every 6 hours if needed to clear mucus  . Tiotropium Bromide-Olodaterol (STIOLTO RESPIMAT) 2.5-2.5 MCG/ACT AERS USE 2 PUFFS DAILY  . [DISCONTINUED] bisacodyl (DULCOLAX) 10 MG suppository Place 10 mg rectally as needed for moderate constipation.   No facility-administered encounter medications on file as of 03/01/2020.    Review of Systems:  Review of Systems  Constitutional: Negative for chills, fever and malaise/fatigue.  HENT: Negative for hearing loss.   Eyes: Negative for blurred vision.  Respiratory: Negative for cough and shortness of breath.        Uses oxygen for her COPD  Cardiovascular: Negative for chest pain, palpitations and leg swelling.  Gastrointestinal: Negative for abdominal pain, blood in stool, constipation, diarrhea and  melena.  Genitourinary: Negative for dysuria.  Musculoskeletal: Negative for falls, joint pain and myalgias.       Scoliosis  Skin: Positive for itching. Negative for rash.  Neurological: Positive for tingling and sensory change. Negative for loss of consciousness.  Psychiatric/Behavioral: Positive for memory loss. Negative for depression. The patient is not nervous/anxious and does not have insomnia.     Health Maintenance  Topic Date Due  . TETANUS/TDAP  Never done  . DEXA SCAN  Never done  . FOOT EXAM  02/14/2017  . OPHTHALMOLOGY EXAM  12/28/2018  . HEMOGLOBIN A1C  07/13/2020  . INFLUENZA VACCINE  Completed  . COVID-19 Vaccine  Completed  . PNA vac Low Risk Adult  Completed    Physical Exam: Vitals:   03/01/20 1032  BP: 138/72  Pulse: 82  Temp: 97.7 F (36.5 C)  TempSrc: Temporal  SpO2: 97%  Weight: 166 lb 3.2 oz (75.4 kg)  Height: 5' 3"  (1.6 m)   Body mass index is 29.44 kg/m. Physical Exam Vitals reviewed.  Constitutional:      General: She is not in acute distress.    Appearance: Normal appearance. She is not toxic-appearing.  HENT:     Head: Normocephalic and atraumatic.     Right Ear: External ear normal.     Left Ear: External ear normal.     Nose: Nose normal.     Mouth/Throat:     Pharynx: Oropharynx is clear.  Eyes:     Extraocular Movements: Extraocular movements intact.     Conjunctiva/sclera: Conjunctivae normal.     Pupils: Pupils are equal, round, and reactive to light.  Cardiovascular:     Rate and Rhythm: Normal rate and regular rhythm.     Pulses: Normal pulses.     Heart sounds: Normal heart sounds.  Pulmonary:     Effort: Pulmonary effort is normal. No respiratory distress.     Breath sounds: No wheezing, rhonchi or rales.     Comments: Using her portable O2 Abdominal:     General: Bowel sounds are normal.     Palpations:  Abdomen is soft.  Musculoskeletal:        General: Normal range of motion.     Cervical back: Neck supple.      Right lower leg: No edema.     Left lower leg: No edema.     Comments: Uses cane to ambulate, needs both arms to get up out of chair  Skin:    General: Skin is warm and dry.     Comments: Dry scaly skin on back  Neurological:     General: No focal deficit present.     Mental Status: She is alert and oriented to person, place, and time.     Cranial Nerves: No cranial nerve deficit.     Motor: No weakness.     Gait: Gait normal.     Comments: Repeats a lot of stories she told me when she was in rehab   Psychiatric:        Mood and Affect: Mood normal.        Behavior: Behavior normal.     Labs reviewed: Basic Metabolic Panel: Recent Labs    08/04/19 1106 01/13/20 1213 01/13/20 1603 01/14/20 0338 01/15/20 0708 01/15/20 0708 01/18/20 1727 01/18/20 1727 01/20/20 0625 01/20/20 0625 01/22/20 0425 01/30/20 0000 02/02/20 0000  NA 138   < >  --    < > 139   < > 136   < > 137   < > 136 133* 134*  K 4.3   < >  --    < > 4.3   < > 4.0   < > 4.3   < > 4.4 5.4* 4.6  CL 99   < >  --    < > 100   < > 94*   < > 97*   < > 96* 93* 95*  CO2 32   < >  --    < > 30   < > 28   < > 30   < > 31 25* 31*  GLUCOSE 77   < >  --    < > 172*   < > 250*  --  199*  --  255*  --   --   BUN 11   < >  --    < > 21   < > 8   < > 8   < > 8 9 8   CREATININE 0.66   < > 0.82   < > 0.72   < > 0.76   < > 0.61   < > 0.73 0.6 0.6  CALCIUM 9.5   < >  --    < > 8.7*   < > 8.9   < > 8.9   < > 9.2 9.6 9.6  MG  --   --  1.7  --  2.3  --   --   --   --   --   --   --   --   PHOS  --   --  3.4  --  3.5  --   --   --   --   --   --   --   --   TSH 3.27  --  1.970  --   --   --   --   --   --   --   --   --   --    < > = values in this interval not displayed.   Liver Function Tests:  Recent Labs    08/04/19 1106 01/13/20 1213 01/14/20 0338  AST 19 20 23   ALT 12 15 16   ALKPHOS 51 50 50  BILITOT 0.3 0.8 0.8  PROT 7.5 7.1 6.5  ALBUMIN 4.0 3.4* 3.1*   No results for input(s): LIPASE, AMYLASE in the last 8760  hours. No results for input(s): AMMONIA in the last 8760 hours. CBC: Recent Labs    08/04/19 1106 08/04/19 1106 01/13/20 1213 01/13/20 1226 01/18/20 1727 01/18/20 1727 01/20/20 0625 01/22/20 0425 01/30/20 0000  WBC 8.4   < > 10.0   < > 11.9*   < > 9.1 8.6 9.7  NEUTROABS 3.7  --  8.0*  --   --   --   --   --   --   HGB 13.7   < > 12.5   < > 13.9   < > 13.2 12.7 14.2  HCT 43.3   < > 41.0   < > 46.1*   < > 44.1 40.9 45  MCV 83.0   < > 86.3   < > 86.8  --  87.8 87.4  --   PLT 166.0   < > 167   < > 206   < > 140* 157 155   < > = values in this interval not displayed.   Lipid Panel: Recent Labs    08/04/19 1106  CHOL 160  HDL 41.70  LDLCALC 86  TRIG 164.0*  CHOLHDL 4   Lab Results  Component Value Date   HGBA1C 9.8 (H) 01/14/2020    Procedures since last visit: CUP PACEART INCLINIC DEVICE CHECK  Result Date: 02/19/2020 Wound check appointment. Steri-strips removed. Wound without redness or edema. Incision edges approximated, wound well healed. Normal device function. Thresholds, sensing, and impedances consistent with implant measurements. Device programmed at 3.5V/auto capture programmed on for extra safety margin until 3 month visit. Histogram distribution appropriate for patient and level of activity. Unable to obtain AT/AF burden d/t fast path summary was not saved. Longest episodes was 1 minute 48 seconds, appeared AT with controlled VR. 1 episode of AF with controlled VR, 14 seconds, no EGM available, no OAC on file. No high ventricular rates noted. Patient educated about wound care, arm mobility, lifting restrictions. ROV with Dr. Lovena Le 04/20/20. Next home remote 04/14/20. Patient advised to plug home remote monitor box in when she gets home. If she cant locate it, patient was given [employer] phone number. Education packet given as well as home remote monitor pack. Patient verbalized understanding.Lavenia Atlas, BSN, RN   Assessment/Plan 1. Chronic respiratory failure  with hypoxia (HCC) - continue portable oxygen due to her chronic bronchitis  2. Complete heart block (HCC) -s/p pacer  3. Status post placement of cardiac pacemaker -f/u Dr. Lovena Le as planned  4. Scoliosis due to degenerative disease of spine in adult patient -ongoing, c/o pain when she's been sitting a while and then has to get up  5. Mixed hyperlipidemia -cont pravachol -f/u lab before next visit  6. Diabetic polyneuropathy associated with type 2 diabetes mellitus (HCC) -hba1c was 9.8  -tresiba 44 units nightly now, also taking novolog 12 units  -says appetite poor  7. Need for influenza vaccination - Flu Vaccine QUAD High Dose(Fluad)  8.  Mild cognitive impairment with memory loss -scored 23/30 on MMSE--missed one on location, attention and calculation, one on recall Failed clock -caregiver brought her today but did not come back to appt  Labs/tests ordered:   Lab Orders  CBC with Differential/Platelet     COMPLETE METABOLIC PANEL WITH GFR     Lipid panel     Hemoglobin A1c  Next appt:  4 mos med mgt with fasting labs before  Freddrick Gladson L. Shalon Councilman, D.O. [facility] [address], [place] [address] Cell Phone (Mon-Fri 8am-5pm):  [phone] On Call:  [phone] & follow prompts after 5pm & weekends Office Phone:  [phone] Office Fax:  [phone]

## 2020-03-02 ENCOUNTER — Encounter: Payer: Self-pay | Admitting: *Deleted

## 2020-03-02 ENCOUNTER — Other Ambulatory Visit: Payer: Self-pay | Admitting: *Deleted

## 2020-03-02 NOTE — Patient Outreach (Signed)
[facility]) Care Management  03/02/2020  [name] 1935/11/28 [id number]   [facility] Telephone Assessment/Screen for post snf referral  Referral Date: 02/11/20 Referral Source:  [name], [name] Post acute coordinator Referral Reason: Please assign to [facility] RN [name] for complex case management. [name] for meals, transportation (PCP appt on 02/16/20) and community resources. Transitioned from [facility] on 02/10/20. Lives alone. High risk for readmission. Declined Remote Health. Communication sent to SNF [name] to inquire about home health agency. Please see writer's notes from 02/11/20. Please call with questions. Thank you. [name] Rolling, MSN-Ed, RN,BSN-[name] Post Acute Care Coordinator-[phone]   Insurance: [employer] Admission 01/20/20 to 1/77/11 acute metabolic encephalopathy,  9/7-9/10/21 for acute metabolic encephalopathy thought secondary to heart block and hypoxia with signs of Klebsiella UTI   Outreach attempt # 2 successful  Patient is able to verify HIPAA, DOB and address Reviewed and addressed referral to [facility] patient Consent: [facility] RN [name] reviewed [name] services with patient. Patient gave verbal consent for services [facility] telephonic RN [name].  Had a good visit with new pcp, [name] report did not know about her heart block 166 lbs 03/01/20 125-130 when married reports after dx of DM and insulin she began to increase weight has got to 200 lb   Diabetes Pt reports [name] was her endocrinologist/pcp. She reports receiving a letter in July 2021 stating he would no long be be able to provide services  At this time she prefers for [name], primary care provider (PCP),  to manage her DM for right now Poor appetite continues  [facility] RN [name] reviewed BMI Body Mass Index (BMI) with her  Education provided and encouraged to outreach to her pcp if begin losing more than 3-5 lbs /week Discussed nutritional supplements she is to try Glucerna and premier  CBG 177  this morning She reports getting up in the middle of the night to eat a snack She is preparing to check and eat lunch    She keeps a list of resources as she reports she is having memory concerns since her hospitalization Discussed encephalopathy and encouraged her to review the EMMI materials mailed to her   Falls on step after first move to apt, at post office,  1 fall in last 3 months  Has a lock box on her door per the apartment complex staff for safety This will allow them to enter her apartment if she is not responding She also has a medical alert necklace she keeps on her walker to be able to have it near her at all times.    She reports she has not been able to look through all her mail  She can not recall getting her AVS, EMMIs nor [name] resources for private duty care agency's as well as other community services.at this time  She reports she will go through the pile of mail this week  Back hurt so bad related to scoliosis but she is aware she is not a candidate for surgery at this time  Heart block/pace maker  Reviewed symptoms of heart block to included fainting, dizziness, feeling tired, trouble breathing, chest pain DME [employer] medical monitor at home   Plans [name] will follow up with [name] in the next 30 business days Pt encouraged to return a call to [facility] RN [name] prn Goals Addressed              This Visit's Progress  Patient Stated   .  [facility]) Manage My Diet (pt-stated)   On track     Follow Up Date 02/26/20   - avoid foods that make my symptoms worse - eat meals at regular times - learn about special diets that can help my symptoms    Why is this important?   Changing what you eat and drink may help you to manage your condition.  It is important to take it slow, making 1 change at a time.  A dietitian is the best person to guide you.    Notes:     .  [facility]) Monitor and Manage My Blood Sugar (pt-stated)   On track     Follow Up Date 04/02/20   -  check blood sugar at prescribed times - check blood sugar if I feel it is too high or too low - take the blood sugar meter to all doctor visits    Why is this important?   Checking your blood sugar at home helps to keep it from getting very high or very low.  Writing the results in a diary or log helps the doctor know how to care for you.  Your blood sugar log should have the time, date and the results.  Also, write down the amount of insulin or other medicine that you take.  Other information, like what you ate, exercise done and how you were feeling, will also be helpful.     Notes:        [name] Salmi L. Lavina Hamman, RN, BSN, [place] Coordinator Office number [phone] Main [facility] number [phone] Fax number [phone]

## 2020-03-03 ENCOUNTER — Ambulatory Visit: Payer: Medicare Other | Admitting: Internal Medicine

## 2020-03-03 ENCOUNTER — Telehealth: Payer: Self-pay

## 2020-03-03 NOTE — Telephone Encounter (Signed)
PA request was received from (pharmacy): [facility] XGKMK:[phone] Fax: [phone] Medication name and strength: Stiolto respimat 2.5-2.31mg/act aers Ordering Provider: clinton young  Was PA started with CMM?: yes If yes, please enter KEY: Key: [id number] Medication tried and failed: xopenex,anoro,ipratropium albuterol Covered Alternatives:   PA sent to plan, time frame for approval / denial: 24-48 Routing to  [employer]for follow-up

## 2020-03-03 NOTE — Telephone Encounter (Signed)
Pa for stiolto respimat has been approved effective 12/04/2019 - 03/03/2021.pt pharmacy [facility] has been noifified nothing further needed

## 2020-03-04 ENCOUNTER — Telehealth: Payer: Self-pay | Admitting: Orthopedic Surgery

## 2020-03-04 NOTE — Telephone Encounter (Signed)
Patient called scheduled an appointment for 03/11/2020. Patient asked if there is anything she can take other than Tylenol until her appointment? The number to contact patient is [phone]

## 2020-03-04 NOTE — Telephone Encounter (Signed)
I called pt and advised that we have not seen her since 12/2019 will have to see her in office for eval. Suggested aleve 2 bid and the pt states that she has tried that before in the past and it has not helped. Will discuss at office visit will continue with tylenol, rest ice and heat as it helps.

## 2020-03-11 ENCOUNTER — Encounter: Payer: Self-pay | Admitting: Orthopedic Surgery

## 2020-03-11 ENCOUNTER — Ambulatory Visit: Payer: Self-pay

## 2020-03-11 ENCOUNTER — Ambulatory Visit (INDEPENDENT_AMBULATORY_CARE_PROVIDER_SITE_OTHER): Payer: Medicare Other | Admitting: Orthopedic Surgery

## 2020-03-11 DIAGNOSIS — G8929 Other chronic pain: Secondary | ICD-10-CM

## 2020-03-11 DIAGNOSIS — R5381 Other malaise: Secondary | ICD-10-CM

## 2020-03-11 DIAGNOSIS — M25511 Pain in right shoulder: Secondary | ICD-10-CM

## 2020-03-11 DIAGNOSIS — M79601 Pain in right arm: Secondary | ICD-10-CM | POA: Diagnosis not present

## 2020-03-11 NOTE — Progress Notes (Signed)
Office Visit Note   Patient: [name]           Date of Birth: Dec 27, 1935           MRN: [id number] Visit Date: 03/11/2020              Requested by: Gayland Curry, DO [place],  [place] [address] PCP: Gayland Curry, DO  Chief Complaint  Patient presents with  . Right Shoulder - Pain      HPI: Patient is an 84 year old woman presents complaining of pain in her entire right upper extremity states she has decreased range of motion and the pain started when she was in rehab and she was pulled by her right arm.  Patient denies any specific trauma.  Patient states she was hospitalized for several months and in therapy for cardiac surgery.  She is status post ulnar nerve transposition on the right by Dr. Amedeo Plenty.  Assessment & Plan: Visit Diagnoses:  1. Chronic right shoulder pain   2. Right arm pain   3. Physical deconditioning     Plan: Recommended strengthening she is significantly deconditioned.  Discussed that there are not any surgical interventions to help with the globalized pain in the right upper extremity.  Recommended Voltaren gel for the elbow and shoulder recommended paraffin baths for the arthritis in her hand.  Follow-Up Instructions: Return if symptoms worsen or fail to improve.   Ortho Exam  Patient is alert, oriented, no adenopathy, well-dressed, normal affect, normal respiratory effort. Examination patient has abduction flexion of the right shoulder to 90 degrees.  She does have pain with generalized range of motion but no instability.  Patient has pain with generalized range of motion of the right elbow wrist and hand.  Patient has difficulty getting from a sitting to a standing position.  Imaging: No results found. No images are attached to the encounter.  Labs: Lab Results  Component Value Date   HGBA1C 9.8 (H) 01/14/2020   HGBA1C 11.7 (A) 11/05/2019   HGBA1C 10.3 (A) 06/04/2019   LABURIC 4.1 11/16/2014   REPTSTATUS 01/20/2020  FINAL 01/19/2020   CULT MULTIPLE SPECIES PRESENT, SUGGEST RECOLLECTION (A) 01/19/2020   LABORGA KLEBSIELLA PNEUMONIAE (A) 01/13/2020     Lab Results  Component Value Date   ALBUMIN 3.1 (L) 01/14/2020   ALBUMIN 3.4 (L) 01/13/2020   ALBUMIN 4.0 08/04/2019   LABURIC 4.1 11/16/2014    Lab Results  Component Value Date   MG 2.3 01/15/2020   MG 1.7 01/13/2020   MG 1.6 12/20/2016   Lab Results  Component Value Date   VD25OH 38.42 12/09/2018   VD25OH 19.95 (L) 02/06/2018    No results found for: PREALBUMIN CBC EXTENDED Latest Ref Rng & Units 01/30/2020 01/22/2020 01/20/2020  WBC - 9.7 8.6 9.1  RBC 3.87 - 5.11 5.34(A) 4.68 5.02  HGB 12.0 - 16.0 14.2 12.7 13.2  HCT 36 - 46 45 40.9 44.1  PLT 150 - 399 155 157 140(L)  NEUTROABS 1.7 - 7.7 K/uL - - -  LYMPHSABS 0.7 - 4.0 K/uL - - -     There is no height or weight on file to calculate BMI.  Orders:  Orders Placed This Encounter  Procedures  . XR Shoulder Right   No orders of the defined types were placed in this encounter.    Procedures: No procedures performed  Clinical Data: No additional findings.  ROS:  All other systems negative, except as noted in the  HPI. Review of Systems  Objective: Vital Signs: There were no vitals taken for this visit.  Specialty Comments:  No specialty comments available.  PMFS History: Patient Active Problem List   Diagnosis Date Noted  . Mild cognitive impairment with memory loss 01/29/2020  . Full code status 01/26/2020  . UTI (urinary tract infection) 01/19/2020  . Status post placement of cardiac pacemaker 01/19/2020  . Leukocytosis 01/19/2020  . Pressure injury of skin 01/14/2020  . Complete heart block ([place])   . Acute metabolic encephalopathy   . Acute on chronic respiratory failure with hypoxia ([place])   . Elevated troponin   . Palpitation 01/12/2020  . Scoliosis due to degenerative disease of spine in adult patient 09/10/2018  . Radicular pain of left lower extremity  09/10/2018  . Hand arthritis 10/11/2017  . Type 2 diabetes mellitus ([place]) 03/08/2017  . Diarrhea in adult patient 11/08/2016  . Thrombocytopenia ([place]) 11/08/2016  . Degenerative lumbar spinal stenosis 10/27/2015  . Arthralgia 09/22/2015  . Chronic respiratory failure with hypoxia ([place]) 01/11/2015  . Diabetic polyneuropathy associated with type 2 diabetes mellitus ([place]) 11/16/2014  . Hypertension 05/19/2013  . Vertigo 05/11/2013  . Dizziness 05/11/2013  . Tachycardia 05/11/2013  . Lower urinary tract infectious disease 05/11/2013  . Sinusitis 05/11/2013  . Hyperlipidemia 02/15/2013  . Type II diabetes mellitus, uncontrolled ([place]) 01/01/2013  . Benign paroxysmal positional vertigo 08/09/2012  . Type II or unspecified type diabetes mellitus with neurological manifestations, uncontrolled(250.62) 08/09/2012  . GLAUCOMA 03/16/2010  . HOARSENESS 03/16/2010  . COUGH 12/23/2009  . CHEST PAIN 12/23/2009  . MUSCULOSKELETAL PAIN 01/28/2009  . COPD mixed type ([place]) 01/15/2009  . Seasonal and perennial allergic rhinitis 11/23/2007  . CHRONIC RHINITIS 07/16/2007  . GERD (gastroesophageal reflux disease) 05/29/2007  . I B S-DIARRHEAL PREDOMINATE 05/29/2007  . FATTY LIVER DISEASE 05/29/2007   Past Medical History:  Diagnosis Date  . Acute and chronic respiratory failure with hypoxia ([place])   . Allergic rhinitis, cause unspecified    Sinus CT Rec 12-23-2009  . Anxiety disorder, unspecified   . Arthritis   . Atrioventricular block, complete ([place])   . Benign paroxysmal positional vertigo 08/09/2012  . 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 Chronic obstructive pulmonary disease, unspecified ([place])   . Chronic rhinitis   . Cognitive communication deficit   . Complication of anesthesia    takes along time to wake up  . Constipation, unspecified   . COPD mixed type ([place])   . Diabetes mellitus   . Diarrhea   . Diverticulosis   . Dizziness  and giddiness   . Elevated troponin   . Esophageal reflux   . Esophageal stricture   . Essential (primary) hypertension   . Gastro-esophageal reflux disease without esophagitis   . GERD (gastroesophageal reflux disease)   . Glaucoma   . Hiatal hernia   . History of urinary tract infection   . Hyperlipidemia, unspecified   . Hypertension   . Irritable bowel syndrome   . Leukocytosis   . Metabolic encephalopathy   . Muscle weakness (generalized)   . Neuropathy in diabetes ([place]) 08/09/2012  . Obesity, unspecified   . Oth speech/lang deficits following oth cerebvasc disease   . Other abnormalities of gait and mobility   . Other chronic nonalcoholic liver disease   . Other forms of scoliosis, site unspecified   . Other specified diseases of liver   . Oxygen deficiency   . Presence  of permanent cardiac pacemaker 01/14/2020  . Sciatica   . Scoliosis   . Shingles 2010  . Shortness of breath   . Spinal stenosis, lumbar region, without neurogenic claudication   . Steatohepatitis   . Thrombocytopenia, unspecified ([place])   . Type 2 diabetes mellitus with diabetic polyneuropathy ([place])   . Type 2 diabetes mellitus with other diabetic neurological complication ([place])   . Unsteadiness on feet     Family History  Problem Relation Age of Onset  . Heart disease Father   . Lung cancer Mother        small cell;Byssinosis  . Lung cancer Sister   . Liver cancer Sister        ? mets from another area of the body  . Diabetes Other        grandmother  . Stroke Maternal Grandfather     Past Surgical History:  Procedure Laterality Date  . APPENDECTOMY    . CARPAL TUNNEL RELEASE     right hand  . CHOLECYSTECTOMY OPEN  1978  . COLONOSCOPY  07-2001   mild diverticulosis  . ESOPHAGOGASTRODUODENOSCOPY  8185,63-14   H Hernia,es.stricture s/p dil 37F  . INTRAOCULAR LENS INSERTION Bilateral   . LIVER BIOPSY  [phone]  . PACEMAKER IMPLANT N/A 01/14/2020   Procedure: PACEMAKER IMPLANT;  Surgeon:  Evans Lance, MD;  Location: [facility];  Service: Cardiovascular;  Laterality: N/A;  . PARATHYROID EXPLORATION    . TONSILLECTOMY AND ADENOIDECTOMY    . TOTAL ABDOMINAL HYSTERECTOMY    . ULNAR NERVE TRANSPOSITION  12/07/2011   Procedure: ULNAR NERVE DECOMPRESSION/TRANSPOSITION;this was cancelled-not done  Surgeon: Cammie Sickle., MD;  Location: [facility];  Service: Orthopedics;  Laterality: Right;  right ulnar nerve in situ decompression  . ULNAR TUNNEL RELEASE  03/07/2012   Procedure: CUBITAL TUNNEL RELEASE;  Surgeon: Roseanne Kaufman, MD;  Location: [facility];  Service: Orthopedics;  Laterality: Right;  ulnar nerve release at the elbow     Social History   Occupational History  . Occupation: Retired   Tobacco Use  . Smoking status: Former Smoker    Types: Cigarettes    Quit date: 05/08/1988    Years since quitting: 31.8  . Smokeless tobacco: Never Used  Vaping Use  . Vaping Use: Never used  Substance and Sexual Activity  . Alcohol use: No    Alcohol/week: 0.0 standard drinks  . Drug use: No  . Sexual activity: Not Currently

## 2020-03-24 ENCOUNTER — Telehealth: Payer: Self-pay | Admitting: Endocrinology

## 2020-03-24 NOTE — Telephone Encounter (Signed)
Detailed message left on pharmacy vm

## 2020-03-24 NOTE — Telephone Encounter (Signed)
[name] with Kristopher Oppenheim at [facility] called re: Request to be called at ph#  [phone] to be given a diagnosis code for the RX for test strips (RX was sent on 08/07/2019)

## 2020-03-30 ENCOUNTER — Other Ambulatory Visit: Payer: Self-pay | Admitting: *Deleted

## 2020-03-30 ENCOUNTER — Other Ambulatory Visit: Payer: Self-pay

## 2020-03-30 NOTE — Patient Outreach (Signed)
[facility]) Care Management  03/30/2020  [name] 1935/11/03 [id number]   [facility] Telephone Assessment/Screen forpost snfreferral  Referral Date:02/11/20 Referral Source:[name] [facility] RN CM Post acute coordinator Referral Reason:Please assign to [facility] CMfor complex case management. [facility] LC SW for meals, transportation (PCP appt on 02/16/20) and community resources. Transitioned from [facility] on 02/10/20. Lives alone. High risk for readmission. Declined Remote Health. Communication sent to SNF SW to inquire about home health agency. Please see writer's notes from 02/11/20. Please call with questions. Thank you. [name] Rolling, MSN-Ed, RN,BSN-[facility] Post Acute Care [phone]   Insurance:[employer] Medicare Admission 01/20/20 to 3/47/42 acute metabolic encephalopathy, 5/9-5/63/87FIE acute metabolic encephalopathy thought secondary to heart block and hypoxia with signs of Klebsiella UTI   Outreach attempt #3 successful Patient is able to verify HIPAA, DOB and address Reviewed and addressed referral to [facility] patient Consent:[facility] RN CM reviewed [facility] services with patient. Patient gave verbal consent for services [facility] telephonic RN CM.   No energy at all see MD on Saw PA  Arms (left arm more) near rotator cuff hurt since after surgery see surgeon 04/20/20  Reports can bearly pick up a broom and [name] still comes by to take her shopping Has [facility] (CAP) services but reports difficulty with getting CAP staff to assist her for more than a few weeks She has spoken with her CAP case worker, [name], on this week She voiced interest in assisted living and has discussed this with her CAP case worker who is willing to assist her but she states she prefers to not be without her cats Lives off social security  Had a good visit with new pcp, [name] report did not know about her heart block 166 lbs 03/01/20 125-130 when married  reports after dx of DM and insulin she began to increase weight has got to 200 lb   Diabetes cbg was 64 this morning Discussed having her pcp refer her to another endocrinologist but she prefers to see [name] Pt reports [name] was her endocrinologist/pcp. She reports receiving a letter in July 2021 stating he would no longer be be able to provide pcp services  She was encouraged to speak with [name] with about re referring her to [name]    She keeps a list of resources as she reports she is having memory concerns since her hospitalization Discussed encephalopathy and encouraged her to review the EMMI materials mailed to her   Falls on step after first move to apt, at post office,  1 fall in last 3 months  Has a lock box on her door per the apartment complex staff for safety This will allow them to enter her apartment if she is not responding She also has a medical alert necklace she keeps on her walker to be able to have it near her at all times.    She reports she has not been able to look through all her mail  She can not recall getting her AVS, EMMIs nor [facility] SW resources for private duty care agency's as well as other community services.at this time  She reports she will go through the pile of mail this week  Plans [facility] RN CM will follow up with [name] in the next 30 business days Pt encouraged to return a call to [facility] RN CM prn Goals      Patient Stated   .  [facility] Manage My Diet (pt-stated)  Follow Up Date 05/03/20   - avoid foods that make my symptoms worse - eat meals at regular times - learn about special diets that can help my symptoms     Notes:     .  ([facility]) Monitor and Manage My Blood Sugar (pt-stated)      Follow Up Date 05/03/20   - check blood sugar at prescribed times - check blood sugar if I feel it is too high or too low - take the blood sugar meter to all doctor visits      Notes:         [name] Millin L. Lavina Hamman, RN, BSN, [place] Coordinator Office number [phone] [facility] number [phone] Fax number [phone]

## 2020-04-02 ENCOUNTER — Ambulatory Visit: Payer: Medicare Other | Admitting: *Deleted

## 2020-04-07 ENCOUNTER — Other Ambulatory Visit: Payer: Self-pay

## 2020-04-07 DIAGNOSIS — E1142 Type 2 diabetes mellitus with diabetic polyneuropathy: Secondary | ICD-10-CM

## 2020-04-07 MED ORDER — METOPROLOL SUCCINATE ER 50 MG PO TB24: 50.0000 mg | ORAL_TABLET | Freq: Every day | ORAL | 1 refills | Status: DC

## 2020-04-08 ENCOUNTER — Other Ambulatory Visit: Payer: Self-pay | Admitting: Family

## 2020-04-08 DIAGNOSIS — K219 Gastro-esophageal reflux disease without esophagitis: Secondary | ICD-10-CM

## 2020-04-14 ENCOUNTER — Ambulatory Visit (INDEPENDENT_AMBULATORY_CARE_PROVIDER_SITE_OTHER): Payer: Medicare Other

## 2020-04-14 DIAGNOSIS — I442 Atrioventricular block, complete: Secondary | ICD-10-CM | POA: Diagnosis not present

## 2020-04-16 LAB — CUP PACEART REMOTE DEVICE CHECK
Battery Remaining Longevity: 107 mo
Battery Remaining Percentage: 95.5 %
Battery Voltage: 3.04 V
Brady Statistic AP VP Percent: 2.1 %
Brady Statistic AP VS Percent: 1 %
Brady Statistic AS VP Percent: 72 %
Brady Statistic AS VS Percent: 26 %
Brady Statistic RA Percent Paced: 2.2 %
Brady Statistic RV Percent Paced: 74 %
Date Time Interrogation Session: 20211208211319
Implantable Lead Implant Date: 20210908
Implantable Lead Implant Date: 20210908
Implantable Lead Location: 753859
Implantable Lead Location: 753860
Implantable Pulse Generator Implant Date: 20210908
Lead Channel Impedance Value: 550 Ohm
Lead Channel Impedance Value: 590 Ohm
Lead Channel Pacing Threshold Amplitude: 0.5 V
Lead Channel Pacing Threshold Amplitude: 0.875 V
Lead Channel Pacing Threshold Pulse Width: 0.4 ms
Lead Channel Pacing Threshold Pulse Width: 0.5 ms
Lead Channel Sensing Intrinsic Amplitude: 3.1 mV
Lead Channel Sensing Intrinsic Amplitude: 8.1 mV
Lead Channel Setting Pacing Amplitude: 1.125
Lead Channel Setting Pacing Amplitude: 3.5 V
Lead Channel Setting Pacing Pulse Width: 0.5 ms
Lead Channel Setting Sensing Sensitivity: 2 mV
Pulse Gen Model: 2272
Pulse Gen Serial Number: 3856705

## 2020-04-20 ENCOUNTER — Other Ambulatory Visit: Payer: Self-pay

## 2020-04-20 ENCOUNTER — Ambulatory Visit (INDEPENDENT_AMBULATORY_CARE_PROVIDER_SITE_OTHER): Payer: Medicare Other | Admitting: Internal Medicine

## 2020-04-20 VITALS — BP 158/74 | HR 107 | Ht 63.0 in | Wt 165.4 lb

## 2020-04-20 DIAGNOSIS — Z950 Presence of cardiac pacemaker: Secondary | ICD-10-CM | POA: Diagnosis not present

## 2020-04-20 DIAGNOSIS — I442 Atrioventricular block, complete: Secondary | ICD-10-CM

## 2020-04-20 MED ORDER — METOPROLOL SUCCINATE ER 50 MG PO TB24: 50.0000 mg | ORAL_TABLET | Freq: Two times a day (BID) | ORAL | 3 refills | Status: DC

## 2020-04-20 NOTE — Progress Notes (Signed)
HPI [name] returns today for followup. She is a pleasant 84 yo woman with a h/o CHB, s/p PPM insertion 3 months ago. She has done well in the interim with no chest pain or sob. She denies syncope.  Allergies  Allergen Reactions  . Chlordiazepoxide-Clidinium Other (See Comments)    Sleepy,weak  . 5-Alpha Reductase Inhibitors   . Biaxin [Clarithromycin] Other (See Comments)    Foul taste, abd pain, diarrhea  . Tramadol Other (See Comments)    Caused tremors  . Codeine Other (See Comments)    REACTION: gi upset- only in high doses per pt.  Can tolerate cough syrup with codeine.      Current Outpatient Medications  Medication Sig Dispense Refill  . acetaminophen (TYLENOL) 325 MG tablet Take 2 tablets (650 mg total) by mouth every 6 (six) hours as needed for mild pain (or Fever >/= 101).    Marland Kitchen aspirin 325 MG tablet Take 325 mg by mouth daily.    . insulin aspart (NOVOLOG) 100 UNIT/ML injection Inject 12 Units into the skin 3 (three) times daily before meals.    . insulin degludec (TRESIBA FLEXTOUCH) 200 UNIT/ML FlexTouch Pen Inject 44 Units into the skin daily. 5 mL 0  . ipratropium-albuterol (DUONEB) 0.5-2.5 (3) MG/3ML SOLN 1 vial in neb every 8 hours and as needed 90 mL 0  . irbesartan (AVAPRO) 75 MG tablet Take 1 tablet (75 mg total) by mouth daily. 30 tablet 0  . levalbuterol (XOPENEX HFA) 45 MCG/ACT inhaler Inhale 1-2 puffs into the lungs every 6 (six) hours as needed for wheezing. 1 each 0  . magic mouthwash SOLN Swish with 2 teaspoonfuls by mouth as needed 240 mL 0  . nystatin cream (MYCOSTATIN) Apply 1 application topically 2 (two) times daily. 60 g 0  . omeprazole (PRILOSEC) 20 MG capsule TAKE (1) CAPSULE DAILY. 90 capsule 1  . polyethylene glycol (MIRALAX / GLYCOLAX) 17 g packet Take 17 g by mouth daily as needed.  0  . pravastatin (PRAVACHOL) 20 MG tablet Take 1 tablet (20 mg total) by mouth daily. 30 tablet 0  . sodium chloride HYPERTONIC 3 % nebulizer solution 1  ampule neb every 6 hours if needed to clear mucus 75 mL 0  . Tiotropium Bromide-Olodaterol (STIOLTO RESPIMAT) 2.5-2.5 MCG/ACT AERS USE 2 PUFFS DAILY 4 g 4  . metoprolol succinate (TOPROL-XL) 50 MG 24 hr tablet Take 1 tablet (50 mg total) by mouth in the morning and at bedtime. Take with or immediately following a meal. 180 tablet 3   No current facility-administered medications for this visit.     Past Medical History:  Diagnosis Date  . Acute and chronic respiratory failure with hypoxia ([place])   . Allergic rhinitis, cause unspecified    Sinus CT Rec 12-23-2009  . Anxiety disorder, unspecified   . Arthritis   . Atrioventricular block, complete ([place])   . Benign paroxysmal positional vertigo 08/09/2012  . 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 Chronic obstructive pulmonary disease, unspecified ([place])   . Chronic rhinitis   . Cognitive communication deficit   . Complication of anesthesia    takes along time to wake up  . Constipation, unspecified   . COPD mixed type ([place])   . Diabetes mellitus   . Diarrhea   . Diverticulosis   . Dizziness and giddiness   . Elevated troponin   . Esophageal reflux   . Esophageal  stricture   . Essential (primary) hypertension   . Gastro-esophageal reflux disease without esophagitis   . GERD (gastroesophageal reflux disease)   . Glaucoma   . Hiatal hernia   . History of urinary tract infection   . Hyperlipidemia, unspecified   . Hypertension   . Irritable bowel syndrome   . Leukocytosis   . Metabolic encephalopathy   . Muscle weakness (generalized)   . Neuropathy in diabetes ([place]) 08/09/2012  . Obesity, unspecified   . Oth speech/lang deficits following oth cerebvasc disease   . Other abnormalities of gait and mobility   . Other chronic nonalcoholic liver disease   . Other forms of scoliosis, site unspecified   . Other specified diseases of liver   . Oxygen deficiency   . Presence of  permanent cardiac pacemaker 01/14/2020  . Sciatica   . Scoliosis   . Shingles 2010  . Shortness of breath   . Spinal stenosis, lumbar region, without neurogenic claudication   . Steatohepatitis   . Thrombocytopenia, unspecified ([place])   . Type 2 diabetes mellitus with diabetic polyneuropathy ([place])   . Type 2 diabetes mellitus with other diabetic neurological complication ([place])   . Unsteadiness on feet     ROS:   All systems reviewed and negative except as noted in the HPI.   Past Surgical History:  Procedure Laterality Date  . APPENDECTOMY    . CARPAL TUNNEL RELEASE     right hand  . CHOLECYSTECTOMY OPEN  1978  . COLONOSCOPY  07-2001   mild diverticulosis  . ESOPHAGOGASTRODUODENOSCOPY  0962,83-66   H Hernia,es.stricture s/p dil 29F  . INTRAOCULAR LENS INSERTION Bilateral   . LIVER BIOPSY  [phone]  . PACEMAKER IMPLANT N/A 01/14/2020   Procedure: PACEMAKER IMPLANT;  Surgeon: Evans Lance, MD;  Location: [facility];  Service: Cardiovascular;  Laterality: N/A;  . PARATHYROID EXPLORATION    . TONSILLECTOMY AND ADENOIDECTOMY    . TOTAL ABDOMINAL HYSTERECTOMY    . ULNAR NERVE TRANSPOSITION  12/07/2011   Procedure: ULNAR NERVE DECOMPRESSION/TRANSPOSITION;this was cancelled-not done  Surgeon: Cammie Sickle., MD;  Location: [facility];  Service: Orthopedics;  Laterality: Right;  right ulnar nerve in situ decompression  . ULNAR TUNNEL RELEASE  03/07/2012   Procedure: CUBITAL TUNNEL RELEASE;  Surgeon: Roseanne Kaufman, MD;  Location: [facility];  Service: Orthopedics;  Laterality: Right;  ulnar nerve release at the elbow       Family History  Problem Relation Age of Onset  . Heart disease Father   . Lung cancer Mother        small cell;Byssinosis  . Lung cancer Sister   . Liver cancer Sister        ? mets from another area of the body  . Diabetes Other        grandmother  . Stroke Maternal Grandfather      Social History    Socioeconomic History  . Marital status: Single    Spouse name: John,divorced, deceased  . Number of children: 0  . Years of education: high school  . Highest education level: High school graduate  Occupational History  . Occupation: Retired   Tobacco Use  . Smoking status: Former Smoker    Types: Cigarettes    Quit date: 05/08/1988    Years since quitting: 31.9  . Smokeless tobacco: Never Used  Vaping Use  . Vaping Use: Never used  Substance and Sexual Activity  . Alcohol use: No  Alcohol/week: 0.0 standard drinks  . Drug use: No  . Sexual activity: Not Currently  Other Topics Concern  . Not on file  Social History Narrative  . Not on file   Social Determinants of Health   Financial Resource Strain: Not on file  Food Insecurity: No Food Insecurity  . Worried About Charity fundraiser in the Last Year: Never true  . Ran Out of Food in the Last Year: Never true  Transportation Needs: No Transportation Needs  . Lack of Transportation (Medical): No  . Lack of Transportation (Non-Medical): No  Physical Activity: Not on file  Stress: Not on file  Social Connections: Moderately Isolated  . Frequency of Communication with Friends and Family: More than three times a week  . Frequency of Social Gatherings with Friends and Family: More than three times a week  . Attends Religious Services: More than 4 times per year  . Active Member of Clubs or Organizations: No  . Attends Archivist Meetings: Patient refused  . Marital Status: Divorced  Human resources officer Violence: Not on file     BP (!) 158/74   Pulse (!) 107   Ht 5' 3"  (1.6 m)   Wt 165 lb 6.4 oz (75 kg)   SpO2 96%   BMI 29.30 kg/m   Physical Exam:  Well appearing elderly woman, NAD HEENT: Unremarkable Neck:  6 cm JVD, no thyromegally Lymphatics:  No adenopathy Back:  No CVA tenderness Lungs:  Clear with no wheezes HEART:  Regular rate rhythm, no murmurs, no rubs, no clicks Abd:  soft, positive  bowel sounds, no organomegally, no rebound, no guarding Ext:  2 plus pulses, no edema, no cyanosis, no clubbing Skin:  No rashes no nodules Neuro:  CN II through XII intact, motor grossly intact  EKG - NSR with ventricular pacing  DEVICE  Normal device function.  See PaceArt for details.   Assess/Plan: 1. CHB - she is asymptomatic, s/p PPM insertion. She had questions about her developing heart block which I tried to explain to her today.  2. PPM -her St. Jude DDD PM is working normally. We will recheck in several months. 3. HTN - her sbp is elevated and I asked her to reduce her sodium intake. Also I asked her to increase her toprol to 50 bid. 4. Dyslipidemia - she will continue pravastatin.  Sharica Roedel,MD 4.

## 2020-04-20 NOTE — Patient Instructions (Addendum)
Medication Instructions:  Your physician has recommended you make the following change in your medication:   1.  INCREASE your Toprol XL 50 mg- Take one tablet by mouth twice a day.  Labwork: None ordered.  Testing/Procedures: None ordered.  Follow-Up: Your physician wants you to follow-up in: one year with Dr. Lovena Le.   You will receive a reminder letter in the mail two months in advance. If you don't receive a letter, please call our office to schedule the follow-up appointment.  Remote monitoring is used to monitor your Pacemaker from home. This monitoring reduces the number of office visits required to check your device to one time per year. It allows [place] to keep an eye on the functioning of your device to ensure it is working properly. You are scheduled for a device check from home on 07/14/2020. You may send your transmission at any time that day. If you have a wireless device, the transmission will be sent automatically. After your physician reviews your transmission, you will receive a postcard with your next transmission date.  Any Other Special Instructions Will Be Listed Below (If Applicable).  If you need a refill on your cardiac medications before your next appointment, please call your pharmacy.

## 2020-04-22 ENCOUNTER — Telehealth: Payer: Self-pay | Admitting: Internal Medicine

## 2020-04-22 NOTE — Telephone Encounter (Signed)
Pt calling today to report frequent episodes of diarrhea. She is not sure what is causing it. She currently is not on antibiotics and has not had a change in diet or routine. She thinks her PPM may be causing the symptoms. I advised her to contact her PCP as her PPM would likely not be the reason she is having these symptoms.   In addition, she had questions regarding the reason for and function of her PPM. She was concerned with the safety and possibility of malfunction. I assured her of its safety and rare instance of malfunction. If there were a malfunction, the device clinic would be alerted. She was comforted in knowing her PPM was being monitored every 90 days by the clinic. We discussed the need for her PPM was hx of CHB.   Lastly, she wanted to ask about her ASA. She wonders if she should continue to take this. She states she has been "on it for years." I advised her I would forward to Dr. Tanna Furry RN for his review and recommendation.  She had no additional needs at this time.

## 2020-04-22 NOTE — Telephone Encounter (Signed)
Patient would like to speak with Dr. Tanna Furry nurse in regards to her heart condition. She states she has a few questions that she would like to ask her. Please advise.

## 2020-04-26 MED ORDER — ASPIRIN EC 81 MG PO TBEC: 81.0000 mg | DELAYED_RELEASE_TABLET | Freq: Every day | ORAL | 3 refills | Status: DC

## 2020-04-26 NOTE — Telephone Encounter (Signed)
Left message for Pt advising she should not be taking aspirin 325 mg daily.  She should only take aspirin 81 mg one tablet by mouth daily.  Advised to call if she had any questions.

## 2020-04-27 NOTE — Progress Notes (Signed)
Remote pacemaker transmission.   

## 2020-04-28 ENCOUNTER — Other Ambulatory Visit: Payer: Self-pay

## 2020-04-28 ENCOUNTER — Telehealth: Payer: Self-pay | Admitting: *Deleted

## 2020-04-28 ENCOUNTER — Other Ambulatory Visit: Payer: Self-pay | Admitting: *Deleted

## 2020-04-28 ENCOUNTER — Encounter: Payer: Self-pay | Admitting: *Deleted

## 2020-04-28 DIAGNOSIS — E1142 Type 2 diabetes mellitus with diabetic polyneuropathy: Secondary | ICD-10-CM

## 2020-04-28 MED ORDER — TRESIBA FLEXTOUCH 200 UNIT/ML ~~LOC~~ SOPN: 36.0000 [IU] | PEN_INJECTOR | Freq: Every day | SUBCUTANEOUS | 0 refills | Status: DC

## 2020-04-28 NOTE — Telephone Encounter (Signed)
Kim with [facility] called and stated that she spoke with patient today and there are some concerns with patient's blood sugar being low. Stated that this morning the blood sugar was 50.   Patient is currently taking Novolog 12 units three times daily and Tresiba 44 units daily. Patient lives alone.   Kim with [facility] is wanting to know if the insulin can be Decreased.   Please Advise.

## 2020-04-28 NOTE — Patient Outreach (Signed)
[facility]) Care Management  04/28/2020  MAHAYLA HADDAWAY 12/17/1935 [id number]    [facility] follow up Telephone Assessment forpost snfreferred patient/complex care  Referral Date:02/11/20 Referral Source:Atika [place] Crane [facility] RN CM Post acute coordinator Referral Reason:Please assign to [facility] CMfor complex case management. [facility] SWfor meals, transportation (PCP appointment on 02/16/20) and community resources. Transitioned from [facility] on 02/10/20. Lives alone. High risk for readmission. Declined Remote Health. Communication sent to SNF SW to inquire about home health agency. Please see writer's notes from 02/11/20. Please call with questions. Thank you. Marthenia Rolling, MSN-Ed, RN,BSN-THN Post Acute Care Coordinator-[phone]   Insurance:[employer] Admission 01/20/20 to 3/97/67 acute metabolic encephalopathy, 3/4-1/93/79KWI acute metabolic encephalopathy thought secondary to heart block and hypoxia with signs of Klebsiella UTI   Outreach attempt #[id number] Patient is able to verify HIPAA, DOB and address Reviewed and addressed referral to [facility] patient Consent:THN RN CM reviewed [facility] services with patient. Patient gave verbal consent for services [facility] telephonic RN CM.  Follow up She has [facility] aide visiting at the time of the outreach She was assisted to get this aide with assist of Mrs Francee Piccolo of [employer] This aide is new and started last week  She has an alert bracelet  Sleep 8-9 pm wake at 4 am or 6- 7 am  She has 40--45 [facility] hours She was reminded of the private duty care givers list sent to her by [place]     Cardiac home management She saw Dr Lovena Le BP elevated a cardiology office and he increased her metoprolol to twice a day. She was encouraged to take Aspirin 81 mg everyday Mrs Seeman has not taken her BP at home only in the office She voice fear of checking it and it  She voices her fear of being  unconscious, not remembering what lead to her having to be hospitalized especially after being told for years her heart was good  She is aware her pacemaker is helping to prevent future heart blocks episodes She reports not sleeping well still but confirms she has never been a good sleeper  THN RN CM discussed home action plan to increase her exercise, eat a well-balanced diet, and not smoking to assist with preventing future heart block Also discussed the monitoring of her pacemaker as preventive care     Reviewed encephalopathy definition, causes and home preventive care plan to include use of her oxygen as ordered Answered questions for her. Confirmed her encephalopathy was resolved prior to hospital discharge   Covid vaccines Had J & J covid shots needs booster will get a home visit soon to get her booster  Diabetes CBG 50 value this am She ate a meal and juice She confirms taking tresiba and novolog as ordered plus not eating well. She reports she eats until she feels full.  Last noted Epic HgA1c was 9.8 on 01/14/20 [facility] RN CM discussed means of preventing morning hypoglycemia episodes to included a good balance dinner/night snack, or being evaluated for insulin night doses. Ms Mishra provides permission for [facility] RN CM to outreach to [name] office regarding her hypoglycemia     THN RN CM interventions  Offer of [facility] SW services for fears of low CBG, elevated BP, not wanting to sleep in her bed, etc but denied at this time She reports watching cowboys shows, gift giving to others, caring for her cats and talking with aide and friends helps her  Outreach to primary care provider (  PCP) related to low CBG value of 50  Last seen at pcp on 03/01/20  Spoke with anita about pt low CBG values, medications, poor appetite and her fear of illnesses that triggers her to recall her recent traumatic hospitalization   Rodena Piety will send a note to [name]  [facility] RN CM informed Rodena Piety Mrs Petrenko wanted MD staff to  know she would be out of the home to get some groceries, would be back soon and if a message needed to be left, she request the staff state that they were requested to call after speaking with [facility] RN CM  Rodena Piety confirms pt next pcp visit/labs is on 06/01/19    [facility] RN CM will follow up with Mrs Pennywell in the next 56 business days Pt encouraged to return a call to [facility] RN CM prn Routed note to MD Goals      Patient Stated   .  [facility]) Manage My Diet (pt-stated)      Follow Up Date 012/24/22   - avoid foods that make my symptoms worse - eat meals at regular times - take time to eat - learn about special diets that can help my symptoms       Notes:  04/28/20 reports poor appetite and am CBG value of 50 Encouraged her to take time to eat at night, notified pcp    .  [facility]) Manage My Emotions (pt-stated)      Timeframe:  Short-Term Goal Priority:  Medium Start Date:       04/28/20                      Expected End Date:    07/05/20                   Follow Up Date 05/31/20   - laugh; watch a funny movie or comedian - perform a random act of kindness - talk about feelings with a friend, family or spiritual advisor         Notes:     .  [facility]) Monitor and Manage My Blood Sugar (pt-stated)      Follow Up Date 05/31/20  - check blood sugar at prescribed times - check blood sugar if I feel it is too high or too low - take the blood sugar meter to all doctor visits    Notes: 04/28/20 CBG 50, home plan ate meal/juice Gave RN CM permission to notify pcp in case of need of treatment adjustment, RN CM spoke with Raymond Gurney L. Lavina Hamman, RN, BSN, [place] Coordinator Office number [phone] [facility] number [phone] Fax number [phone]

## 2020-04-28 NOTE — Telephone Encounter (Signed)
Let's decrease the tresiba to 36 units at bedtime.  Thanks!

## 2020-04-28 NOTE — Telephone Encounter (Signed)
Patient notified. Medication list updated.  Kim with [facility] notified, LMOM with Dr. Cyndi Lennert instruction.

## 2020-05-04 ENCOUNTER — Telehealth: Payer: Self-pay | Admitting: Internal Medicine

## 2020-05-04 MED ORDER — DOXYCYCLINE HYCLATE 100 MG PO TABS: ORAL_TABLET | ORAL | 0 refills | Status: DC

## 2020-05-04 NOTE — Telephone Encounter (Signed)
Called and spoke with pt and she stated that she feels like she has bronchitis.  She has been in and out of the hospital recently and she is using oxygen all the time.  She stated that yesterday her throat felt raspy and raw and she finally got up a ball of green sputum--she felt like this was stuck in her chest and the bottom of her throat.  She is wanting to know if CY would call her in something for bronchitis.    Pending appt on 05/14/20 with CY Last ov--12/26/19  Allergies  Allergen Reactions  . Chlordiazepoxide-Clidinium Other (See Comments)    Sleepy,weak  . 5-Alpha Reductase Inhibitors   . Biaxin [Clarithromycin] Other (See Comments)    Foul taste, abd pain, diarrhea  . Tramadol Other (See Comments)    Caused tremors  . Codeine Other (See Comments)    REACTION: gi upset- only in high doses per pt.  Can tolerate cough syrup with codeine.     Current Outpatient Medications on File Prior to Visit  Medication Sig Dispense Refill  . acetaminophen (TYLENOL) 325 MG tablet Take 2 tablets (650 mg total) by mouth every 6 (six) hours as needed for mild pain (or Fever >/= 101).    Marland Kitchen aspirin EC 81 MG tablet Take 1 tablet (81 mg total) by mouth daily. Swallow whole. 90 tablet 3  . insulin aspart (NOVOLOG) 100 UNIT/ML injection Inject 12 Units into the skin 3 (three) times daily before meals.    . insulin degludec (TRESIBA FLEXTOUCH) 200 UNIT/ML FlexTouch Pen Inject 36 Units into the skin at bedtime. 5 mL 0  . ipratropium-albuterol (DUONEB) 0.5-2.5 (3) MG/3ML SOLN 1 vial in neb every 8 hours and as needed 90 mL 0  . irbesartan (AVAPRO) 75 MG tablet Take 1 tablet (75 mg total) by mouth daily. 30 tablet 0  . levalbuterol (XOPENEX HFA) 45 MCG/ACT inhaler Inhale 1-2 puffs into the lungs every 6 (six) hours as needed for wheezing. 1 each 0  . magic mouthwash SOLN Swish with 2 teaspoonfuls by mouth as needed 240 mL 0  . metoprolol succinate (TOPROL-XL) 50 MG 24 hr tablet Take 1 tablet (50 mg total) by  mouth in the morning and at bedtime. Take with or immediately following a meal. 180 tablet 3  . nystatin cream (MYCOSTATIN) Apply 1 application topically 2 (two) times daily. 60 g 0  . omeprazole (PRILOSEC) 20 MG capsule TAKE (1) CAPSULE DAILY. 90 capsule 1  . polyethylene glycol (MIRALAX / GLYCOLAX) 17 g packet Take 17 g by mouth daily as needed.  0  . pravastatin (PRAVACHOL) 20 MG tablet Take 1 tablet (20 mg total) by mouth daily. 30 tablet 0  . sodium chloride HYPERTONIC 3 % nebulizer solution 1 ampule neb every 6 hours if needed to clear mucus 75 mL 0  . Tiotropium Bromide-Olodaterol (STIOLTO RESPIMAT) 2.5-2.5 MCG/ACT AERS USE 2 PUFFS DAILY 4 g 4   No current facility-administered medications on file prior to visit.

## 2020-05-04 NOTE — Telephone Encounter (Signed)
Called and spoke with pt and she is aware of med sent to the pharmacy.  Nothing further is needed.

## 2020-05-04 NOTE — Telephone Encounter (Signed)
Offer doxycycline 100 mg, # 8, 2 today then one daily 

## 2020-05-13 ENCOUNTER — Other Ambulatory Visit: Payer: Self-pay | Admitting: Endocrinology

## 2020-05-13 NOTE — Progress Notes (Signed)
Patient ID: [name], female    DOB: 01/04/1936, 85 y.o.   MRN: [id number]  HPI Female former smoker followed for COPD, chronic hypoxic respiratory failure, complicated by chronic rhinitis, GERD, DM, glaucoma 2012- Walk test Patient Saturations on Room Air while Ambulating = 84% Patient Saturations on 2.5 Liters of oxygen while Ambulating = 92%  PFT: 01/16/2012 severe obstructive airways disease with insignificant response to bronchodilator, air trapping, diffusion moderately reduced. FEV1 0.82/45%, FEV1/FEC 0.46. Emphysema pattern on the loop. TLC 100%, RV 148%, DLCO 47%. --------------------------------------------------------------------- .  12/26/19- 85 year old female former smoker followed for COPD, chronic hypoxic respiratory failure, complicated by chronic rhinitis, GERD, DM2,  back pain/scoliosis O2 3-3.5 L/ [employer] Generally intolerant of Beta adrenergic stimulants Xopenex HFA, Stiolto 2.5 Respimat, neb Duoneb, Astelin,  -----copd,shob with exertion Patient had called c/o palpitations with dyspnea. Asking for new PCP. Reports intermittent palpitation x 3 weeks, without chest pain or dizziness, but afraid she "is going to die". . Continues to sleep with O2 Asks referal to primary care to establish.  CXR 08/06/19-  Lungs clear. Cardiac silhouette normal. Aortic Atherosclerosis (ICD10-I70.0).  05/14/20-  85 year old female former smoker followed for COPD, chronic hypoxic respiratory failure, complicated by chronic rhinitis, GERD, DM2,  back pain/scoliosis, HTN, Heart Block/ Pacemaker,  O2 3-3.5 L/ [employer] Generally intolerant of Beta adrenergic stimulants Xopenex HFA, Stiolto 2.5 Respimat, neb Duoneb, Astelin, We sent doxycycline 12/28 Hosp 7/74-1/28/78- acute metabolic encephalopathy, DM2, HTN Covid vax- J&J Flu vax- flu                                  Here today with aide ------Patient has a productive cough with clear sputum states it was green but it getting better.  Shortness of breath is worse since last visit. Admitted to the hospital twice in September Sputum was green but now clearing after recent doxy . She has hx of getting broad sensitivities after frequent antibiotics so we are going to see if she can finish recovering from this flare on her own now.  She mainly complains of thick mucus, hard to clear. CTa chest 01/19/20- IMPRESSION: 1. No acute finding, including pulmonary embolism. 2. Mild dependent atelectasis. 3. Aortic Atherosclerosis (ICD10-I70.0).  ROS-see HPI  + = positive Constitutional:   No-   weight loss, night sweats, fevers, chills, fatigue, lassitude. HEENT:   No-  headaches, difficulty swallowing, tooth/dental problems, sore throat,       No-  sneezing, itching, ear ache, nasal congestion, post nasal drip,  CV:  No-   chest pain, orthopnea, PND, swelling in lower extremities, anasarca,                                                   Dizziness,+ palpitations Resp: +shortness of breath with exertion or at rest.               +productive cough,  + non-productive cough,  No- coughing up of blood.                 change in color of mucus.   wheezing.   Skin: No-   rash or lesions. GI:  +heartburn, indigestion, No-abdominal pain, nausea, vomiting,  GU: . MS:  No-   joint pain or swelling.   +  back pain Neuro-     nothing unusual Psych:  No- change in mood or affect. +depression or anxiety.  + memory loss.    Objective:  OBJ- Physical Exam    + Very talkative General- Alert, Oriented, Affect-appropriate, Distress- none acute, + overweight,  Skin- rash-none, lesions- none, excoriation- none Lymphadenopathy- none Head- atraumatic            Eyes- Gross vision intact, PERRLA, conjunctivae and secretions clear            Ears- Hearing, canals-normal            Nose- Clear, no-Septal dev, mucus, polyps, erosion, perforation             Throat- Mallampati IV , mucosa -no thrush , drainage- none, tonsils- atrophic,                                       stridor-none,  + missing teeth Neck- flexible , trachea midline, no stridor , thyroid nl, carotid no bruit Chest - symmetrical excursion , unlabored           Heart/CV- RRR , no murmur , no gallop  , no rub, nl s1 s2                           - JVD- none , edema- none, stasis changes- none, varices- none           Lung-  + clear, unlabored, wheeze-none, cough+raspy, dullness-none, rub- none           Chest wall- +L pacemaker Abd-  Br/ Gen/ Rectal- Not done, not indicated Extrem- cyanosis- none, clubbing, none, atrophy- none, strength- , + wheelchair Neuro- grossly intact to observation       l6

## 2020-05-14 ENCOUNTER — Other Ambulatory Visit: Payer: Self-pay

## 2020-05-14 ENCOUNTER — Other Ambulatory Visit: Payer: Self-pay | Admitting: Internal Medicine

## 2020-05-14 ENCOUNTER — Encounter: Payer: Self-pay | Admitting: Internal Medicine

## 2020-05-14 ENCOUNTER — Other Ambulatory Visit: Payer: Self-pay | Admitting: Endocrinology

## 2020-05-14 ENCOUNTER — Ambulatory Visit (INDEPENDENT_AMBULATORY_CARE_PROVIDER_SITE_OTHER): Payer: Medicare Other | Admitting: Internal Medicine

## 2020-05-14 DIAGNOSIS — I442 Atrioventricular block, complete: Secondary | ICD-10-CM

## 2020-05-14 DIAGNOSIS — J449 Chronic obstructive pulmonary disease, unspecified: Secondary | ICD-10-CM | POA: Diagnosis not present

## 2020-05-14 MED ORDER — LEVALBUTEROL TARTRATE 45 MCG/ACT IN AERO: INHALATION_SPRAY | RESPIRATORY_TRACT | 12 refills | Status: DC

## 2020-05-14 NOTE — Patient Instructions (Signed)
Lets wait on more antibiotics for now.  You can ask your drug store to send you some Mucinex. This will help to cough mucus out of your airways.  You can continue your current breathing medicines.  Please call if we can help

## 2020-05-14 NOTE — Assessment & Plan Note (Signed)
Recent exacerbation with bronchitis improving after doxycycline Plan- Mucinex and adequate hydration to clear mucus. Consider Flutter.

## 2020-05-14 NOTE — Assessment & Plan Note (Signed)
She doesn't recognize much difference now with pacemaker in place. Cardiology following

## 2020-05-14 NOTE — Telephone Encounter (Signed)
Patient has request refill on Novolog. Patient last refill was 02/17/2020 by [name], RN. Please Advise.

## 2020-05-18 ENCOUNTER — Telehealth: Payer: Self-pay

## 2020-05-18 ENCOUNTER — Telehealth: Payer: Self-pay | Admitting: Internal Medicine

## 2020-05-18 NOTE — Telephone Encounter (Signed)
Her description has changed now to suggest infection,  Please offer Zpak 250 mg, # 6, 2 today then one daily    (Her intolerance of Biaxin is just stomach upset)                      Prednisone 20 mg, # 5, 1 daily  She can use otc cough syrups Delsym or Robitussin DM

## 2020-05-18 NOTE — Telephone Encounter (Signed)
Patient called to ask for something for her cough that she can't seem to get rid of because it has been keeping her up at night I explained to her she would need to be seen as I or the providers would not prescribed anything without a visit she said she didn't have a ride to come in at [place] time

## 2020-05-18 NOTE — Telephone Encounter (Signed)
Tried calling the pt again and her line still rings busy

## 2020-05-18 NOTE — Telephone Encounter (Signed)
ATC but line just rings busy. Tried to call back but still received busy tone. Will try to call back later.

## 2020-05-18 NOTE — Telephone Encounter (Signed)
I connected by phone with [name] and/or patient's caregiver on 05/18/2020 at 3:38 PM to discuss the potential vaccination through our Homebound vaccination initiative.   Prevaccination Checklist for COVID-19 Vaccines  1.  Are you feeling sick today? no  2.  Have you ever received a dose of a COVID-19 vaccine?  yes      If yes, which one? [place]   How many dose of Covid-19 vaccine have your received and dates ? 1, 01/23/2020   Check all that apply: I live in a long-term care setting. no  I have been diagnosed with a medical condition(s). Please list: no (pertinent to homebound status)  I am a first responder. no  I work in a long-term care facility, correctional facility, hospital, restaurant, retail setting, school, or other setting with high exposure to the public. no  4. Do you have a health condition or are you undergoing treatment that makes you moderately or severely immunocompromised? (This would include treatment for cancer or HIV, receipt of organ transplant, immunosuppressive therapy or high-dose corticosteroids, CAR-T-cell therapy, hematopoietic cell transplant [HCT], DiGeorge syndrome or Wiskott-Aldrich syndrome)  no  5. Have you received hematopoietic cell transplant (HCT) or CAR-T-cell therapies since receiving COVID-19 vaccine? no  6.  Have you ever had an allergic reaction: (This would include a severe reaction [ e.g., anaphylaxis] that required treatment with epinephrine or EpiPen or that caused you to go to the hospital.  It would also include an allergic reaction that occurred within 4 hours that caused hives, swelling, or respiratory distress, including wheezing.) A.  A previous dose of COVID-19 vaccine. no  B.  A vaccine or injectable therapy that contains multiple components, one of which is a COVID-19 vaccine component, but it is not known which component elicited the immediate reaction. no  C.  Are you allergic to polyethylene glycol? no  D. Are you  allergic to Polysorbate, which is found in some vaccines, film coated tablets and intravenous steroids?  no   7.  Have you ever had an allergic reaction to another vaccine (other than COVID-19 vaccine) or an injectable medication? (This would include a severe reaction [ e.g., anaphylaxis] that required treatment with epinephrine or EpiPen or that caused you to go to the hospital.  It would also include an allergic reaction that occurred within 4 hours that caused hives, swelling, or respiratory distress, including wheezing.)  no   8.  Have you ever had a severe allergic reaction (e.g., anaphylaxis) to something other than a component of the COVID-19 vaccine, or any vaccine or injectable medication?  This would include food, pet, venom, environmental, or oral medication allergies.  no   Check all that apply to you:  Am a female between ages 59 and 44 years old  no  Women 16 through 83 years of age can receive any FDA-authorized or -approved COVID-19 vaccine. However, they should be informed of the rare but increased risk of thrombosis with thrombocytopenia syndrome (TTS) after receipt of the [employer] Vaccine and the availability of other FDA-authorized and -approved COVID-19 vaccines. People who had TTS after a first dose of Janssen vaccine should not receive a subsequent dose of Janssen product    Am a female between ages 38 and 20 years old  no Males 5 through 83 years of age may receive the correct formulation of Pfizer-BioNTech COVID-19 vaccine. Males 18 and older can receive any FDA-authorized or -approved vaccine. However, people receiving an mRNA COVID-19 vaccine, especially  males 58 through 83 years of age and their parents/legal representative (when relevant), should be informed of the risk of developing myocarditis (an inflammation of the heart muscle) or pericarditis (inflammation of the lining around the heart) after receipt of an mRNA vaccine. The risk of developing either myocarditis or  pericarditis after vaccination is low, and lower than the risk of myocarditis associated with SARS-CoV-2 infection in adolescents and adults. Vaccine recipients should be counseled about the need to seek care if symptoms of myocarditis or pericarditis develop after vaccination     Have a history of myocarditis or pericarditis  no Myocarditis or pericarditis after receipt of the first dose of an mRNA COVID-19 vaccine series but before administration of the second dose  Experts advise that people who develop myocarditis or pericarditis after a dose of an mRNA COVID-19 vaccine not receive a subsequent dose of any COVID-19 vaccine, until additional safety data are available.  Administration of a subsequent dose of COVID-19 vaccine before safety data are available can be considered in certain circumstances after the episode of myocarditis or pericarditis has completely resolved. Until additional data are available, some experts recommend a Alphonsa Overall COVID-19 vaccine be considered instead of an mRNA COVID-19 vaccine. Decisions about proceeding with a subsequent dose should include a conversation between the patient, their parent/legal representative (when relevant), and their clinical team, which may include a cardiologist.    Have been treated with monoclonal antibodies or convalescent serum to prevent or treat COVID-19  no Vaccination should be offered to people regardless of history of prior symptomatic or asymptomatic SARS-CoV-2 infection. There is no recommended minimal interval between infection and vaccination.  However, vaccination should be deferred if a patient received monoclonal antibodies or convalescent serum as treatment for COVID-19 or for post-exposure prophylaxis. This is a precautionary measure until additional information becomes available, to avoid interference of the antibody treatment with vaccine-induced immune responses.  Defer COVID-19 vaccination for 30 days when a passive antibody  product was used for post-exposure prophylaxis.  Defer COVID-19 vaccination for 90 days when a passive antibody product was used to treat COVID-19.     Diagnosed with Multisystem Inflammatory Syndrome (MIS-C or MIS-A) after a COVID-19 infection  no It is unknown if people with a history of MIS-C or MIS-A are at risk for a dysregulated immune response to COVID-19 vaccination.  People with a history of MIS-C or MIS-A may choose to be vaccinated. Considerations for vaccination may include:   Clinical recovery from MIS-C or MIS-A, including return to normal cardiac function   Personal risk of severe acute COVID-19 (e.g., age, underlying conditions)   High or substantial community transmission of SARS-CoV-2 and personal increased risk of reinfection.   Timing of any immunomodulatory therapies (general best practice guidelines for immunization can be consulted for more information [email or web address])   It has been 90 days or more since their diagnosis of MIS-C   Onset of MIS-C occurred before any COVID-19 vaccination   A conversation between the patient, their guardian(s), and their clinical team or a specialist may assist with COVID-19 vaccination decisions. Healthcare providers and health departments may also request a consultation from the [place] at [email or web address].     Have a bleeding disorder  no Take a blood thinner  no As with all vaccines, any COVID-19 vaccine product may be given to these patients, if a physician familiar with the patient's bleeding risk determines that the vaccine can be administered intramuscularly with reasonable safety.  ACIP  recommends the following technique for intramuscular vaccination in patients with bleeding disorders or taking blood thinners: a fine-gauge needle (23-gauge or smaller caliber) should be used for the  vaccination, followed by firm pressure on the site, without rubbing, for at least 2 minutes.  People who regularly take aspirin or anticoagulants as part of their routine medications do not need to stop these medications prior to receipt of any COVID-19 vaccine.    Have a history of heparin-induced thrombocytopenia (HIT)  no Although the etiology of TTS associated with the Alphonsa Overall COVID-19 vaccine is unclear, it appears to be similar to another rare immune-mediated syndrome, heparin-induced thrombocytopenia (HIT). People with a history of an episode of an immune-mediated syndrome characterized by thrombosis and thrombocytopenia, such as HIT, should be offered a currently FDA-approved or FDA-authorized mRNA COVID-19 vaccine if it has been ?90 days since their TTS resolved. After 90 days, patients may be vaccinated with any currently FDA-approved or FDA-authorized COVID-19 vaccine, including Janssen COVID-19 Vaccine. However, people who developed TTS after their initial Alphonsa Overall vaccine should not receive a Janssen booster dose.  Experts believe the following factors do not make people more susceptible to TTS after receipt of the [employer]. People with these conditions can be vaccinated with any FDA-authorized or - approved COVID-19 vaccine, including the [employer] COVID-19 Vaccine:   A prior history of venous thromboembolism   Risk factors for venous thromboembolism (e.g., inherited or acquired thrombophilia including Factor V Leiden; prothrombin gene 20210A mutation; antiphospholipid syndrome; protein C, protein S or antithrombin deficiency   A prior history of other types of thromboses not associated with thrombocytopenia   Pregnancy, post-partum status, or receipt of hormonal contraceptives (e.g., combined oral contraceptives, patch, ring)   Additional recipient education materials can be found at http://gutierrez-[email or web address]/ vaccines/safety/JJUpdate.html.    Am currently pregnant  or breastfeeding  no Vaccination is recommended for all people aged 7 years and older, including people that are:   Pregnant   Breastfeeding   Trying to get pregnant now or who might become pregnant in the future   Pregnant, breastfeeding, and post-partum people 88 through 83 years of age should be aware of the rare risk of TTS after receipt of the Alphonsa Overall COVID-19 Vaccine and the availability of other FDA-authorized or -approved COVID-19 vaccines (i.e., mRNA vaccines).    Have received dermal fillers  no FDA-authorized or -approved COVID-19 vaccines can be administered to people who have received injectable dermal fillers who have no contraindications for vaccination.  Infrequently, these people might experience temporary swelling at or near the site of filler injection (usually the face or lips) following administration of a dose of an mRNA COVID-19 vaccine. These people should be advised to contact their healthcare provider if swelling develops at or near the site of dermal filler following vaccination.     Have a history of Guillain-Barr Syndrome (GBS)  no People with a history of GBS can receive any FDA-authorized or -approved COVID-19 vaccine. However, given the possible association between the [employer] and an increased risk of GBS, a patient with a history of GBS and their clinical team should discuss the availability of mRNA vaccines to offer protection against COVID-19. The highest risk has been observed in men aged 16-64 years with symptoms of GBS beginning within 42 days after Alphonsa Overall COVID-19 vaccination.  People who had GBS after receiving Janssen vaccine should be made aware of the option to receive an mRNA COVID-19 vaccine booster at least 2 months (8 weeks) after the  Janssen dose. However, Alphonsa Overall vaccine may be used as a booster, particularly if GBS occurred more than 42 days after vaccination or was related to a non-vaccine factor. Prior to booster vaccination, a  conversation between the patient and their clinical team may assist with decisions about use of a COVID-19 booster dose, including the timing of administration     Postvaccination Observation Times for People without Contraindications to Covid 19 Vaccination.  30 minutes:  People with a history of: A contraindication to another type of COVID-19 vaccine product (i.e., mRNA or viral vector COVID-19 vaccines)   Immediate (within 4 hours of exposure) non-severe allergic reaction to a COVID-19 vaccine or injectable therapies   Anaphylaxis due to any cause   Immediate allergic reaction of any severity to a non-COVID-19 vaccine   15 minutes: All other people  This patient is a 85 y.o. female that meets the FDA criteria to receive homebound vaccination. Patient or parent/caregiver understands they have the option to accept or refuse homebound vaccination.  Patient passed the pre-screening checklist and would like to proceed with homebound vaccination.  Based on questionnaire above, I recommend the patient be observed for 15 minutes.  There are an estimated #0 other household members/caregivers who are also interested in receiving the vaccine.    The patient has been confirmed homebound and eligible for homebound vaccination with the considerations outlined above. I will send the patient's information to our scheduling team who will reach out to schedule the patient and potential caregiver/family members for homebound vaccination.    Dan Humphreys 05/18/2020 3:38 PM

## 2020-05-18 NOTE — Telephone Encounter (Signed)
Pt in office 05/14/20 and was having a cough with mucus production at that [place]. Pt stated she was told by CY that he was afraid to give her any abx and stated that no meds were prescribed.  Pt was going to get an OTC cough medicine from her pharmacy which had helped before when she has had a cough but stated when she went to the pharmacy, she found out that they were not carrying that medicine anymore. Asked pt what the name of the med was and she could not tell me what the name of the med was.  Pt said her cough is bad at night when she lays down and stated when she wakes up in the morning she will also have a coughing spell. Pt states she is coughing up yellow-green phlegm. Denies any complaints of wheezing.   Due to pt's symptoms and since pt is now coughing up discolored mucus, she is wanting to know if CY might be willing to call in a course of prednisone for her or to see if there is anything else that could be recommended.  Pt denies any complaints of any fever.  Dr. Annamaria Boots, please advise.  Allergies  Allergen Reactions  . Chlordiazepoxide-Clidinium Other (See Comments)    Sleepy,weak  . 5-Alpha Reductase Inhibitors   . Biaxin [Clarithromycin] Other (See Comments)    Foul taste, abd pain, diarrhea  . Tramadol Other (See Comments)    Caused tremors  . Codeine Other (See Comments)    REACTION: gi upset- only in high doses per pt.  Can tolerate cough syrup with codeine.      Current Outpatient Medications:  .  acetaminophen (TYLENOL) 325 MG tablet, Take 2 tablets (650 mg total) by mouth every 6 (six) hours as needed for mild pain (or Fever >/= 101)., Disp: , Rfl:  .  aspirin EC 81 MG tablet, Take 1 tablet (81 mg total) by mouth daily. Swallow whole., Disp: 90 tablet, Rfl: 3 .  insulin aspart (NOVOLOG) 100 UNIT/ML injection, Inject 12 Units into the skin 3 (three) times daily before meals., Disp: , Rfl:  .  insulin degludec (TRESIBA FLEXTOUCH) 200 UNIT/ML FlexTouch Pen, Inject 36 Units  into the skin at bedtime., Disp: 5 mL, Rfl: 0 .  ipratropium-albuterol (DUONEB) 0.5-2.5 (3) MG/3ML SOLN, 1 vial in neb every 8 hours and as needed, Disp: 90 mL, Rfl: 0 .  irbesartan (AVAPRO) 75 MG tablet, Take 1 tablet (75 mg total) by mouth daily., Disp: 30 tablet, Rfl: 0 .  levalbuterol (XOPENEX HFA) 45 MCG/ACT inhaler, Inhale 1-2 puffs into the lungs every 6 (six) hours as needed for wheezing., Disp: 1 each, Rfl: 0 .  levalbuterol (XOPENEX HFA) 45 MCG/ACT inhaler, Inhale 1-2 puffs every 6 hours as needed, Disp: 1 each, Rfl: 12 .  magic mouthwash SOLN, Swish with 2 teaspoonfuls by mouth as needed, Disp: 240 mL, Rfl: 0 .  metoprolol succinate (TOPROL-XL) 50 MG 24 hr tablet, Take 1 tablet (50 mg total) by mouth in the morning and at bedtime. Take with or immediately following a meal., Disp: 180 tablet, Rfl: 3 .  NOVOLOG FLEXPEN 100 UNIT/ML FlexPen, INJECT 12-14 UNITS UNDER THE SKIN AT BREAKFAST, 14-16 UNITS AT LUNCH, AND 12-16 UNITS AT DINNER., Disp: 15 mL, Rfl: 0 .  nystatin cream (MYCOSTATIN), Apply 1 application topically 2 (two) times daily., Disp: 60 g, Rfl: 0 .  omeprazole (PRILOSEC) 20 MG capsule, TAKE (1) CAPSULE DAILY., Disp: 90 capsule, Rfl: 1 .  polyethylene glycol (  MIRALAX / GLYCOLAX) 17 g packet, Take 17 g by mouth daily as needed., Disp: , Rfl: 0 .  pravastatin (PRAVACHOL) 20 MG tablet, Take 1 tablet (20 mg total) by mouth daily., Disp: 30 tablet, Rfl: 0 .  sodium chloride HYPERTONIC 3 % nebulizer solution, 1 ampule neb every 6 hours if needed to clear mucus, Disp: 75 mL, Rfl: 0 .  Tiotropium Bromide-Olodaterol (STIOLTO RESPIMAT) 2.5-2.5 MCG/ACT AERS, USE 2 PUFFS DAILY, Disp: 4 g, Rfl: 4

## 2020-05-19 ENCOUNTER — Telehealth: Payer: Self-pay | Admitting: Internal Medicine

## 2020-05-19 ENCOUNTER — Encounter: Payer: Self-pay | Admitting: *Deleted

## 2020-05-19 MED ORDER — AZITHROMYCIN 250 MG PO TABS: 250.0000 mg | ORAL_TABLET | ORAL | 0 refills | Status: DC

## 2020-05-19 MED ORDER — PREDNISONE 20 MG PO TABS: 20.0000 mg | ORAL_TABLET | Freq: Every day | ORAL | 0 refills | Status: DC

## 2020-05-19 NOTE — Telephone Encounter (Signed)
05/19/2020  Attempted to contact the patient.  Unable to reach.  Phone line continues to ring.  Will leave in triage for any 1 more attempt on 05/19/2020 as she has acute symptoms.  After that I would recommend that the message be closed and a letter be sent.  As this would then be the fourth attempt trying to reach the patient.  If the patient does call back in the meantime patient needs to be scheduled for follow-up with APP or Dr. Annamaria Boots.  Wyn Quaker, FNP

## 2020-05-19 NOTE — Telephone Encounter (Signed)
05/19/20  Attempted to contact the patient again.  Unable to reach.  Wyn Quaker, FNP

## 2020-05-19 NOTE — Telephone Encounter (Signed)
Letter has been mailed to the pt

## 2020-05-19 NOTE — Telephone Encounter (Signed)
Her description has changed now to suggest infection,  Please offer Zpak 250 mg, # 6, 2 today then one daily    (Her intolerance of Biaxin is just stomach upset)                      Prednisone 20 mg, # 5, 1 daily  She can use otc cough syrups Delsym or Robitussin DM  Spoke with the pt and notified of recs per Dr Annamaria Boots and she verbalized understanding. Rxs sent to pharm.

## 2020-05-26 ENCOUNTER — Other Ambulatory Visit: Payer: Medicare Other

## 2020-05-26 ENCOUNTER — Ambulatory Visit: Payer: Medicare Other | Attending: Critical Care Medicine

## 2020-05-26 DIAGNOSIS — Z23 Encounter for immunization: Secondary | ICD-10-CM

## 2020-05-26 NOTE — Progress Notes (Signed)
   Covid-19 Vaccination Clinic  Name:  [name]    MRN: [id number] DOB: [birth year]  05/26/2020  [name] was observed post Covid-19 immunization for  15 min without incident. She was provided with Vaccine Information Sheet and instruction to access the V-Safe system.   [name] was instructed to call 911 with any severe reactions post vaccine: Marland Kitchen Difficulty breathing  . Swelling of face and throat  . A fast heartbeat  . A bad rash all over body  . Dizziness and weakness   Immunizations Administered    Name Date Dose VIS Date Route   Moderna Covid-19 Booster Vaccine 05/26/2020  2:49 PM 0.25 mL 02/25/2020 Intramuscular   Manufacturer: Moderna   Lot: [id number]   [place]: [phone]

## 2020-05-31 ENCOUNTER — Other Ambulatory Visit: Payer: Self-pay | Admitting: *Deleted

## 2020-05-31 ENCOUNTER — Ambulatory Visit: Payer: Medicare Other | Admitting: Internal Medicine

## 2020-05-31 NOTE — Patient Outreach (Signed)
[facility]) Care Management  05/31/2020  MATTY DEAMER Jul 20, 1935 [id number]   [facility] unsuccessful outreach for complex care patient Mrs ASMI FUGERE was referred to [facility] on 02/11/20 by [facility] post acute care coordinator, Marthenia Rolling [facility] RN CCM  Referral Reason:Please assign to [facility] CMfor complex case management. [facility] SWfor meals, transportation (PCP appointment on 02/16/20) and community resources. Transitioned from [facility] on 02/10/20. Lives alone. High risk for readmission. Declined Remote Health. Communication sent to SNF SW to inquire about home health agency. Please see writer's notes from 02/11/20. Please call with questions. Thank you. Marthenia Rolling, MSN-Ed, RN,BSN-[facility] Post Acute Care Coordinator-[phone]   Insurance:[employer] Admission 01/20/20 to 1/44/31 acute metabolic encephalopathy, 5/4-0/08/67YPP acute metabolic encephalopathy thought secondary to heart block and hypoxia with signs of Klebsiella UTI  Review of Epic notes No further admission/ED visit since 01/23/20 Noted diabetic medication changes after outreach to primary care provider (PCP) office on 04/28/20 Tresiba decreased from 44 to 36 units at night 05/04/20 pt outreached to pulmonologist per action plan for worsening COPD symptom 05/14/20 pulmonology office visit with Dr Annamaria Boots  Pt noted with cough and discolored mucus 05/18/20- 05/19/20 with calls in to pulmonology Some noted difficulty with outreaching to patient noted during this time [facility] Unsuccessful outreach   Outreach attempt to the home number  No answer. [facility] RN CM left HIPAA [facility] Portability and Accountability Act) compliant voicemail message along with CM's contact info.   Plan: [facility] RN CM scheduled this patient for another call attempt within 4-7 business days  Jakeim Sedore L. Lavina Hamman, RN, BSN, [place] Coordinator Office number [phone] Mobile number [phone]  Main  [facility] number [phone] Fax number [phone]

## 2020-06-02 ENCOUNTER — Other Ambulatory Visit: Payer: Self-pay | Admitting: *Deleted

## 2020-06-02 NOTE — Patient Outreach (Signed)
[facility]) Care Management  06/02/2020  [name] 1935/12/26 [id number]  [facility] outreach to complex care patient [name] was referred to [facility] on 02/11/20 by [facility] post acute care coordinator, Marthenia Rolling [facility] RN CCM  Referral Reason:Please assign to [facility] CMfor complex case management. [facility] SWfor meals, transportation ([place] 02/16/20) and community resources. Transition[facility] on 02/10/20. Lives alone. High risk for readmission. Declined Remote Health. Communication sent to SNF SW to inquire about home health agency. Please see writer's notes from 02/11/20. Please call with questions. Thank you. Marthenia Rolling, MSN-Ed, RN,BSN-[facility] Post Acute Care [phone]   Insurance:[employer] Medicare Admission 01/20/20 to 5/00/37 acute metabolic encephalopathy, 0/4-8/88/91QXI acute metabolic encephalopathy thought secondary to heart block and hypoxia with signs of Klebsiella UTI  [name] left a voice message to update [facility] RN CM on how she was doing  She has had home aide concerns (aide works until 3 pm),  She has fatigue, stomach tore up, low cbgs, weight loss, depressed "cause I have a bad heart"  Diabetes she stated cbg low today but then reported cbg of 189    Follow up with pcp 06/07/20  She talked about her cats frequently during the outreach She did have someone to help her to get cats (all boys) [place] and neutered Diarrhea qod irritable bowel syndrome Relational issues with her aides States she gets nervous when have conflict with aides   [facility] RN CM will follow up with pt within the next 30 business days Pt encouraged to return a call to [place] CM prn   [name] Haltiwanger L. Lavina Hamman, RN, BSN, [place] Coordinator Office number [phone] Main [facility] number [phone] Fax number [phone]

## 2020-06-07 ENCOUNTER — Encounter: Payer: Self-pay | Admitting: Internal Medicine

## 2020-06-07 ENCOUNTER — Other Ambulatory Visit: Payer: Medicare Other

## 2020-06-07 ENCOUNTER — Other Ambulatory Visit: Payer: Self-pay

## 2020-06-07 ENCOUNTER — Ambulatory Visit (INDEPENDENT_AMBULATORY_CARE_PROVIDER_SITE_OTHER): Payer: Medicare Other | Admitting: Internal Medicine

## 2020-06-07 VITALS — BP 138/82 | HR 72 | Temp 97.5°F | Ht 63.0 in | Wt 164.0 lb

## 2020-06-07 DIAGNOSIS — E1142 Type 2 diabetes mellitus with diabetic polyneuropathy: Secondary | ICD-10-CM

## 2020-06-07 DIAGNOSIS — J449 Chronic obstructive pulmonary disease, unspecified: Secondary | ICD-10-CM | POA: Diagnosis not present

## 2020-06-07 DIAGNOSIS — J9611 Chronic respiratory failure with hypoxia: Secondary | ICD-10-CM | POA: Diagnosis not present

## 2020-06-07 DIAGNOSIS — M3501 Sicca syndrome with keratoconjunctivitis: Secondary | ICD-10-CM | POA: Diagnosis not present

## 2020-06-07 DIAGNOSIS — E782 Mixed hyperlipidemia: Secondary | ICD-10-CM | POA: Diagnosis not present

## 2020-06-07 DIAGNOSIS — G3184 Mild cognitive impairment, so stated: Secondary | ICD-10-CM

## 2020-06-07 DIAGNOSIS — D696 Thrombocytopenia, unspecified: Secondary | ICD-10-CM | POA: Diagnosis not present

## 2020-06-07 NOTE — Progress Notes (Signed)
Location:  [facility] clinic Provider:  Sangeeta Youse L. Mariea Clonts, D.O., C.M.D.  Code Status: FULL Goals of Care:  Advanced Directives 03/01/2020  Does Patient Have a Medical Advance Directive? Yes  Type of Advance Directive Living will  Does patient want to make changes to medical advance directive? No - Patient declined  Copy of [place] in Chart? -  Would patient like information on creating a medical advance directive? -  Pre-existing out of facility DNR order (yellow form or pink MOST form) -     Chief Complaint  Patient presents with  . Medical Management of Chronic Issues    3 month follow up. Discuss tremors she been having since surgery.     HPI: Patient is a 85 y.o. female seen today for medical management of chronic diseases.    Says she hates getting old.  She feels like she doesn't have any sense left.    Breathing "ain't doin no good".  Especially if she doesn't wear her oxygen.  Says her nose gets dry and scaly.  DMII:  Continues tresiba and novolog.  Needs labs rechecked.    She's still not gone back to sleeping in her bed--uses dual plush lazy boy recliner.  Cat snuggles with her.    Says she can't believe her "heart is bad".    She keeps several cats outside.  Has no family, just cats and they keep her alive she says.    She does cook more or less, but might go get meals.  She is to be eating a high protein and just some carbs.  Knows she needs some.  She'll eat leftovers at lunch.  Caregiver brought her for her appt so she'll buy her lunch.  She doesn't snack like she used to.  Weight is stable.  Caregivers are 9am to 3pm.  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  Now she'll fall asleep watching westerns and miss her stories.  Past Medical History:  Diagnosis Date  . Acute and chronic respiratory failure with hypoxia ([place])   . Allergic rhinitis, cause unspecified    Sinus CT Rec 12-23-2009  . Anxiety disorder,  unspecified   . Arthritis   . Atrioventricular block, complete ([place])   . Benign paroxysmal positional vertigo 08/09/2012  . 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 Chronic obstructive pulmonary disease, unspecified ([place])   . Chronic rhinitis   . Cognitive communication deficit   . Complication of anesthesia    takes along time to wake up  . Constipation, unspecified   . COPD mixed type ([place])   . Diabetes mellitus   . Diarrhea   . Diverticulosis   . Dizziness and giddiness   . Elevated troponin   . Esophageal reflux   . Esophageal stricture   . Essential (primary) hypertension   . Gastro-esophageal reflux disease without esophagitis   . GERD (gastroesophageal reflux disease)   . Glaucoma   . Hiatal hernia   . History of urinary tract infection   . Hyperlipidemia, unspecified   . Hypertension   . Irritable bowel syndrome   . Leukocytosis   . Metabolic encephalopathy   . Muscle weakness (generalized)   . Neuropathy in diabetes ([place]) 08/09/2012  . Obesity, unspecified   . Oth speech/lang deficits following oth cerebvasc disease   . Other abnormalities of gait and mobility   . Other chronic nonalcoholic liver disease   .  Other forms of scoliosis, site unspecified   . Other specified diseases of liver   . Oxygen deficiency   . Presence of permanent cardiac pacemaker 01/14/2020  . Sciatica   . Scoliosis   . Shingles 2010  . Shortness of breath   . Spinal stenosis, lumbar region, without neurogenic claudication   . Steatohepatitis   . Thrombocytopenia, unspecified ([place])   . Type 2 diabetes mellitus with diabetic polyneuropathy ([place])   . Type 2 diabetes mellitus with other diabetic neurological complication ([place])   . Unsteadiness on feet     Past Surgical History:  Procedure Laterality Date  . APPENDECTOMY    . CARPAL TUNNEL RELEASE     right hand  . CHOLECYSTECTOMY OPEN  1978  . COLONOSCOPY  07-2001   mild  diverticulosis  . ESOPHAGOGASTRODUODENOSCOPY  [id number]   H Hernia,es.stricture s/p dil 9F  . INTRAOCULAR LENS INSERTION Bilateral   . LIVER BIOPSY  [phone]  . PACEMAKER IMPLANT N/A 01/14/2020   Procedure: PACEMAKER IMPLANT;  Surgeon: Evans Lance, MD;  Location: [facility];  Service: Cardiovascular;  Laterality: N/A;  . PARATHYROID EXPLORATION    . TONSILLECTOMY AND ADENOIDECTOMY    . TOTAL ABDOMINAL HYSTERECTOMY    . ULNAR NERVE TRANSPOSITION  12/07/2011   Procedure: ULNAR NERVE DECOMPRESSION/TRANSPOSITION;this was cancelled-not done  Surgeon: Cammie Sickle., MD;  Location: [facility];  Service: Orthopedics;  Laterality: Right;  right ulnar nerve in situ decompression  . ULNAR TUNNEL RELEASE  03/07/2012   Procedure: CUBITAL TUNNEL RELEASE;  Surgeon: Roseanne Kaufman, MD;  Location: [facility];  Service: Orthopedics;  Laterality: Right;  ulnar nerve release at the elbow      Allergies  Allergen Reactions  . Chlordiazepoxide-Clidinium Other (See Comments)    Sleepy,weak  . 5-Alpha Reductase Inhibitors   . Biaxin [Clarithromycin] Other (See Comments)    Foul taste, abd pain, diarrhea  . Tramadol Other (See Comments)    Caused tremors  . Codeine Other (See Comments)    REACTION: gi upset- only in high doses per pt.  Can tolerate cough syrup with codeine.     Outpatient Encounter Medications as of 06/07/2020  Medication Sig  . acetaminophen (TYLENOL) 325 MG tablet Take 2 tablets (650 mg total) by mouth every 6 (six) hours as needed for mild pain (or Fever >/= 101).  Marland Kitchen aspirin EC 81 MG tablet Take 1 tablet (81 mg total) by mouth daily. Swallow whole.  . insulin aspart (NOVOLOG) 100 UNIT/ML injection Inject 12 Units into the skin 3 (three) times daily before meals.  . insulin degludec (TRESIBA FLEXTOUCH) 200 UNIT/ML FlexTouch Pen Inject 36 Units into the skin at bedtime.  Marland Kitchen ipratropium-albuterol (DUONEB) 0.5-2.5 (3) MG/3ML SOLN 1 vial in neb  every 8 hours and as needed  . irbesartan (AVAPRO) 75 MG tablet Take 1 tablet (75 mg total) by mouth daily.  Marland Kitchen levalbuterol (XOPENEX HFA) 45 MCG/ACT inhaler Inhale 1-2 puffs into the lungs every 6 (six) hours as needed for wheezing.  . levalbuterol (XOPENEX HFA) 45 MCG/ACT inhaler Inhale 1-2 puffs every 6 hours as needed  . magic mouthwash SOLN Swish with 2 teaspoonfuls by mouth as needed  . metoprolol succinate (TOPROL-XL) 50 MG 24 hr tablet Take 1 tablet (50 mg total) by mouth in the morning and at bedtime. Take with or immediately following a meal.  . NOVOLOG FLEXPEN 100 UNIT/ML FlexPen INJECT 12-14 UNITS UNDER THE SKIN AT BREAKFAST, 14-16 UNITS AT LUNCH,  AND 12-16 UNITS AT DINNER.  Marland Kitchen nystatin cream (MYCOSTATIN) Apply 1 application topically 2 (two) times daily.  Marland Kitchen omeprazole (PRILOSEC) 20 MG capsule TAKE (1) CAPSULE DAILY.  Marland Kitchen polyethylene glycol (MIRALAX / GLYCOLAX) 17 g packet Take 17 g by mouth daily as needed.  . pravastatin (PRAVACHOL) 20 MG tablet Take 1 tablet (20 mg total) by mouth daily.  . predniSONE (DELTASONE) 20 MG tablet Take 1 tablet (20 mg total) by mouth daily with breakfast.  . sodium chloride HYPERTONIC 3 % nebulizer solution 1 ampule neb every 6 hours if needed to clear mucus  . Tiotropium Bromide-Olodaterol (STIOLTO RESPIMAT) 2.5-2.5 MCG/ACT AERS USE 2 PUFFS DAILY  . [DISCONTINUED] azithromycin (ZITHROMAX) 250 MG tablet Take 1 tablet (250 mg total) by mouth as directed.   No facility-administered encounter medications on file as of 06/07/2020.    Review of Systems:  Review of Systems  Constitutional: Negative for chills, fever and malaise/fatigue.  HENT: Negative for congestion and sore throat.   Eyes: Negative for blurred vision.  Respiratory: Positive for shortness of breath and wheezing. Negative for cough.   Cardiovascular: Negative for chest pain, palpitations and leg swelling.  Gastrointestinal: Positive for diarrhea. Negative for abdominal pain, blood in stool,  constipation and melena.  Genitourinary: Negative for dysuria.  Musculoskeletal: Positive for back pain and joint pain. Negative for falls.       Has scoliosis  Neurological: Negative for dizziness and loss of consciousness.  Endo/Heme/Allergies: Bruises/bleeds easily.  Psychiatric/Behavioral: Positive for memory loss. The patient does not have insomnia.     Health Maintenance  Topic Date Due  . TETANUS/TDAP  Never done  . DEXA SCAN  Never done  . FOOT EXAM  02/14/2017  . OPHTHALMOLOGY EXAM  12/28/2018  . HEMOGLOBIN A1C  07/13/2020  . INFLUENZA VACCINE  Completed  . COVID-19 Vaccine  Completed  . PNA vac Low Risk Adult  Completed    Physical Exam: Vitals:   06/07/20 1127  BP: 138/82  Pulse: 72  Temp: (!) 97.5 F (36.4 C)  SpO2: 98%  Weight: 164 lb (74.4 kg)  Height: 5' 3"  (1.6 m)   Body mass index is 29.05 kg/m. Physical Exam Vitals reviewed.  Constitutional:      General: She is not in acute distress.    Appearance: Normal appearance. She is not toxic-appearing.  Eyes:     Conjunctiva/sclera: Conjunctivae normal.     Pupils: Pupils are equal, round, and reactive to light.  Cardiovascular:     Rate and Rhythm: Normal rate and regular rhythm.     Pulses: Normal pulses.     Heart sounds: Normal heart sounds.  Pulmonary:     Effort: Pulmonary effort is normal.     Breath sounds: Normal breath sounds. No wheezing, rhonchi or rales.     Comments: Wearing her O2 via [place] Abdominal:     General: Bowel sounds are normal.     Palpations: Abdomen is soft.  Musculoskeletal:        General: Normal range of motion.     Right lower leg: No edema.     Left lower leg: No edema.     Comments: kyphoscoliosis  Skin:    General: Skin is warm and dry.  Neurological:     General: No focal deficit present.     Mental Status: She is alert and oriented to person, place, and time.     Comments: But repeats her stories and has challenges with her meds  Psychiatric:  Mood and  Affect: Mood normal.        Behavior: Behavior normal.     Labs reviewed: Basic Metabolic Panel: Recent Labs    08/04/19 1106 01/13/20 1213 01/13/20 1603 01/14/20 0338 01/15/20 0708 01/18/20 1727 01/20/20 0625 01/22/20 0425 01/30/20 0000 02/02/20 0000  NA 138   < >  --    < > 139 136 137 136 133* 134*  K 4.3   < >  --    < > 4.3 4.0 4.3 4.4 5.4* 4.6  CL 99   < >  --    < > 100 94* 97* 96* 93* 95*  CO2 32   < >  --    < > 30 28 30 31  25* 31*  GLUCOSE 77   < >  --    < > 172* 250* 199* 255*  --   --   BUN 11   < >  --    < > 21 8 8 8 9 8   CREATININE 0.66   < > 0.82   < > 0.72 0.76 0.61 0.73 0.6 0.6  CALCIUM 9.5   < >  --    < > 8.7* 8.9 8.9 9.2 9.6 9.6  MG  --   --  1.7  --  2.3  --   --   --   --   --   PHOS  --   --  3.4  --  3.5  --   --   --   --   --   TSH 3.27  --  1.970  --   --   --   --   --   --   --    < > = values in this interval not displayed.   Liver Function Tests: Recent Labs    08/04/19 1106 01/13/20 1213 01/14/20 0338  AST 19 20 23   ALT 12 15 16   ALKPHOS 51 50 50  BILITOT 0.3 0.8 0.8  PROT 7.5 7.1 6.5  ALBUMIN 4.0 3.4* 3.1*   No results for input(s): LIPASE, AMYLASE in the last 8760 hours. No results for input(s): AMMONIA in the last 8760 hours. CBC: Recent Labs    08/04/19 1106 01/13/20 1213 01/13/20 1226 01/18/20 1727 01/20/20 0625 01/22/20 0425 01/30/20 0000  WBC 8.4 10.0   < > 11.9* 9.1 8.6 9.7  NEUTROABS 3.7 8.0*  --   --   --   --   --   HGB 13.7 12.5   < > 13.9 13.2 12.7 14.2  HCT 43.3 41.0   < > 46.1* 44.1 40.9 45  MCV 83.0 86.3   < > 86.8 87.8 87.4  --   PLT 166.0 167   < > 206 140* 157 155   < > = values in this interval not displayed.   Lipid Panel: Recent Labs    08/04/19 1106  CHOL 160  HDL 41.70  LDLCALC 86  TRIG 164.0*  CHOLHDL 4   Lab Results  Component Value Date   HGBA1C 9.8 (H) 01/14/2020    Assessment/Plan 1. Chronic respiratory failure with hypoxia (HCC) -cont home oxygen and portable oxygen  2.  COPD mixed type ([place]) -no wheezing, doing fine on current regimen  3. Sjogren's syndrome with keratoconjunctivitis sicca (HCC) -may use biotene products, hydrate with water  4. Diabetic polyneuropathy associated with type 2 diabetes mellitus ([place]) -cont tresiba and novolog  5. Thrombocytopenia, unspecified ([place]) -f/u labs today  6. Mild  cognitive impairment with memory loss -has caregivers to help her, but only 9-3 and lives alone, no family -reports eating ok and weight maintained -I'd [facility] her at [facility] and she opted to come here--had seen Dr. Dwyane Dee who is now doing exclusively endocrine  Labs/tests ordered:  Labs just done today and next labs to be same day as f/u appt in 4 mos  Next appt:  10/14/2020  Nikolus Marczak L. Tarrance Januszewski, D.O. [facility] [address], [place] [address] Cell Phone (Mon-Fri 8am-5pm):  [phone] On Call:  [phone] & follow prompts after 5pm & weekends Office Phone:  [phone] Office Fax:  [phone]

## 2020-06-08 LAB — HEMOGLOBIN A1C
Hgb A1c MFr Bld: 10.8 % of total Hgb — ABNORMAL HIGH (ref <5.7)
Mean Plasma Glucose: 263 mg/dL
eAG (mmol/L): 14.6 mmol/L

## 2020-06-08 LAB — CBC WITH DIFFERENTIAL/PLATELET
Absolute Monocytes: 413 cells/uL (ref 200–950)
Basophils Absolute: 60 cells/uL (ref 0–200)
Basophils Relative: 0.7 %
Eosinophils Absolute: 284 cells/uL (ref 15–500)
Eosinophils Relative: 3.3 %
HCT: 46.8 % — ABNORMAL HIGH (ref 35.0–45.0)
Hemoglobin: 14.9 g/dL (ref 11.7–15.5)
Lymphs Abs: 3999 cells/uL — ABNORMAL HIGH (ref 850–3900)
MCH: 26.8 pg — ABNORMAL LOW (ref 27.0–33.0)
MCHC: 31.8 g/dL — ABNORMAL LOW (ref 32.0–36.0)
MCV: 84.2 fL (ref 80.0–100.0)
MPV: 11.4 fL (ref 7.5–12.5)
Monocytes Relative: 4.8 %
Neutro Abs: 3844 cells/uL (ref 1500–7800)
Neutrophils Relative %: 44.7 %
Platelets: 198 10*3/uL (ref 140–400)
RBC: 5.56 10*6/uL — ABNORMAL HIGH (ref 3.80–5.10)
RDW: 13.8 % (ref 11.0–15.0)
Total Lymphocyte: 46.5 %
WBC: 8.6 10*3/uL (ref 3.8–10.8)

## 2020-06-08 LAB — COMPLETE METABOLIC PANEL WITH GFR
AG Ratio: 1.2 (calc) (ref 1.0–2.5)
ALT: 11 U/L (ref 6–29)
AST: 17 U/L (ref 10–35)
Albumin: 4.3 g/dL (ref 3.6–5.1)
Alkaline phosphatase (APISO): 53 U/L (ref 37–153)
BUN: 9 mg/dL (ref 7–25)
CO2: 29 mmol/L (ref 20–32)
Calcium: 9.9 mg/dL (ref 8.6–10.4)
Chloride: 98 mmol/L (ref 98–110)
Creat: 0.63 mg/dL (ref 0.60–0.88)
GFR, Est African American: 97 mL/min/{1.73_m2} (ref >=60)
GFR, Est Non African American: 84 mL/min/{1.73_m2} (ref >=60)
Globulin: 3.5 g/dL (calc) (ref 1.9–3.7)
Glucose, Bld: 68 mg/dL (ref 65–139)
Potassium: 4.2 mmol/L (ref 3.5–5.3)
Sodium: 140 mmol/L (ref 135–146)
Total Bilirubin: 0.4 mg/dL (ref 0.2–1.2)
Total Protein: 7.8 g/dL (ref 6.1–8.1)

## 2020-06-08 LAB — LIPID PANEL
Cholesterol: 178 mg/dL (ref <200)
HDL: 55 mg/dL (ref >=50)
LDL Cholesterol (Calc): 101 mg/dL (calc) — ABNORMAL HIGH
Non-HDL Cholesterol (Calc): 123 mg/dL (calc) (ref <130)
Total CHOL/HDL Ratio: 3.2 (calc) (ref <5.0)
Triglycerides: 122 mg/dL (ref <150)

## 2020-06-08 NOTE — Progress Notes (Signed)
Sugar average has trended way up and now over 10!  She admitted to missing her insulin at times. She has had help from 9-3 so most of her time to take meds should be covered by someone being there to remind her.   Cholesterol is also above goal.   Other labs are ok I see there are no other contacts listed to share this information with.  Perhaps when we call her, she'll be willing to put her caregiver on the phone so you can tell her to watch her take her insulin. If that's not possible, I will ask the [facility] folks to try to intervene somehow b/c I'm concerned she's going to end up back in the hospital with high blood sugars.

## 2020-06-09 ENCOUNTER — Other Ambulatory Visit: Payer: Self-pay | Admitting: *Deleted

## 2020-06-09 DIAGNOSIS — B370 Candidal stomatitis: Secondary | ICD-10-CM

## 2020-06-09 MED ORDER — MAGIC MOUTHWASH: ORAL | 0 refills | Status: DC

## 2020-06-09 NOTE — Telephone Encounter (Signed)
Patient called and stated that she saw you on Monday and stated that you were going to call in her Magic Mouthwash for blisters in her mouth but she has not received it from her pharmacy yet. Pharmacy stated they have not received a Rx.   Pended Rx and sent to Dr. Mariea Clonts for approval.

## 2020-06-09 NOTE — Telephone Encounter (Signed)
Dr. Mariea Clonts sent me a staff message stating that she could not get Rx to send to Pharmacy, kept printing.   I called to [facility] and spoke with Pharmacist.

## 2020-06-10 ENCOUNTER — Telehealth: Payer: Self-pay | Admitting: Internal Medicine

## 2020-06-10 DIAGNOSIS — E1142 Type 2 diabetes mellitus with diabetic polyneuropathy: Secondary | ICD-10-CM

## 2020-06-10 MED ORDER — TRESIBA FLEXTOUCH 200 UNIT/ML ~~LOC~~ SOPN: 36.0000 [IU] | PEN_INJECTOR | Freq: Every day | SUBCUTANEOUS | 3 refills | Status: DC

## 2020-06-10 NOTE — Telephone Encounter (Signed)
Medication called to pharmacy. 

## 2020-06-10 NOTE — Telephone Encounter (Signed)
[employer] insulin refill needed. [facility] left pt message stating they need for [facility] to call before it can be refilled. Pt gets meds delivered to her home.  Thanks, Vilinda Blanks

## 2020-06-28 ENCOUNTER — Encounter: Payer: Self-pay | Admitting: Internal Medicine

## 2020-06-29 NOTE — Progress Notes (Signed)
Patient ID: [name], female    DOB: 09/12/1935, 85 y.o.   MRN: [id number]  HPI Female former smoker followed for COPD, chronic hypoxic respiratory failure, complicated by chronic rhinitis, GERD, DM, glaucoma 2012- Walk test Patient Saturations on Room Air while Ambulating = 84% Patient Saturations on 2.5 Liters of oxygen while Ambulating = 92%  PFT: 01/16/2012 severe obstructive airways disease with insignificant response to bronchodilator, air trapping, diffusion moderately reduced. FEV1 0.82/45%, FEV1/FEC 0.46. Emphysema pattern on the loop. TLC 100%, RV 148%, DLCO 47%. ---------------------------------------------------------------------  05/14/20-  85 year old female former smoker followed for COPD, chronic hypoxic respiratory failure, complicated by chronic rhinitis, GERD, DM2,  back pain/scoliosis, HTN, Heart Block/ Pacemaker,  O2 3-3.5 L/ [employer] Generally intolerant of Beta adrenergic stimulants Xopenex HFA, Stiolto 2.5 Respimat, neb Duoneb, Astelin, We sent doxycycline 12/28 Hosp 3/89-3/73/42- acute metabolic encephalopathy, DM2, HTN Covid vax- J&J Flu vax- flu                                  Here today with aide ------Patient has a productive cough with clear sputum states it was green but it getting better. Shortness of breath is worse since last visit. Admitted to the hospital twice in September Sputum was green but now clearing after recent doxy . She has hx of getting broad sensitivities after frequent antibiotics so we are going to see if she can finish recovering from this flare on her own now.  She mainly complains of thick mucus, hard to clear. CTa chest 01/19/20- IMPRESSION: 1. No acute finding, including pulmonary embolism. 2. Mild dependent atelectasis. 3. Aortic Atherosclerosis (ICD10-I70.0).  06/30/20- 85 year old female former smoker followed for COPD, chronic hypoxic respiratory failure, complicated by chronic rhinitis, GERD, DM2,  back pain/scoliosis, HTN,  Heart Block/ Pacemaker, Sjogren's, Thrombocytopenia,  O2 3-3.5 L/ [employer] Arrival sat 93% on portable Covid vax- 1 Moderna, 1 J&J Flu vax- had Generally intolerant of Beta adrenergic stimulants Xopenex HFA, Stiolto 2.5 Respimat, neb Duoneb, Astelin, We sent Zpak and prednisone 20 mg daily x 5 days on 05/19/20. Sleeps in recliner. PCP notes her as having mild cognitive impairment with memory loss. -----Patient states she has good and bad days with her breathing, states her cough came back last week Describes mild dry cough. Agreed we need to stay away from prednisone due to her DM. She never tolerated stimulant inhalers well. Doesn't notice much wheezing.  Resists sample trial of Breztri due to listed side effects, so we agreed to stay with what we are doing.   ROS-see HPI  + = positive Constitutional:   No-   weight loss, night sweats, fevers, chills, fatigue, lassitude. HEENT:   No-  headaches, difficulty swallowing, tooth/dental problems, sore throat,       No-  sneezing, itching, ear ache, nasal congestion, post nasal drip,  CV:  No-   chest pain, orthopnea, PND, swelling in lower extremities, anasarca,                                                   Dizziness,+ palpitations Resp: +shortness of breath with exertion or at rest.               +productive cough,  + non-productive cough,  No- coughing up of blood.  change in color of mucus.   wheezing.   Skin: No-   rash or lesions. GI:  +heartburn, indigestion, No-abdominal pain, nausea, vomiting,  GU: . MS:  No-   joint pain or swelling.   + back pain Neuro-     nothing unusual Psych:  No- change in mood or affect. +depression or anxiety.  + memory loss.    Objective:  OBJ- Physical Exam    Arrival O2 sat 93% on POC General- Alert, Oriented, Affect-appropriate, Distress- none acute,  Skin- rash-none, lesions- none, excoriation- none  Lymphadenopathy- none Head- atraumatic            Eyes- Gross vision intact, PERRLA,  conjunctivae and secretions clear            Ears- Hearing, canals-normal            Nose- Clear, no-Septal dev, mucus, polyps, erosion, perforation             Throat- Mallampati IV , mucosa -no thrush , drainage- none, tonsils- atrophic,                                      stridor-none,  + missing teeth Neck- flexible , trachea midline, no stridor , thyroid nl, carotid no bruit Chest - symmetrical excursion , unlabored           Heart/CV- RRR , no murmur , no gallop  , no rub, nl s1 s2                           - JVD- none , edema- none, stasis changes- none, varices- none           Lung-  + clear, unlabored, wheeze-none, cough- none, dullness-none, rub- none           Chest wall- +L pacemaker Abd-  Br/ Gen/ Rectal- Not done, not indicated Extrem- cyanosis- none, clubbing, none, atrophy- none, strength- , + wheelchair Neuro- grossly intact to observation       l6

## 2020-06-30 ENCOUNTER — Ambulatory Visit (INDEPENDENT_AMBULATORY_CARE_PROVIDER_SITE_OTHER): Payer: Medicare Other | Admitting: Internal Medicine

## 2020-06-30 ENCOUNTER — Other Ambulatory Visit: Payer: Self-pay

## 2020-06-30 ENCOUNTER — Encounter: Payer: Self-pay | Admitting: Internal Medicine

## 2020-06-30 DIAGNOSIS — J9621 Acute and chronic respiratory failure with hypoxia: Secondary | ICD-10-CM

## 2020-06-30 DIAGNOSIS — J449 Chronic obstructive pulmonary disease, unspecified: Secondary | ICD-10-CM | POA: Diagnosis not present

## 2020-06-30 NOTE — Assessment & Plan Note (Signed)
She remains dependent on O2 24/7 Plan continue 3-4L

## 2020-06-30 NOTE — Assessment & Plan Note (Signed)
She is clear without cough today. We hope to minimize systemic steroids Plan- continue current meds

## 2020-06-30 NOTE — Patient Instructions (Signed)
We can continue oxygen and your current breathing meds  You might like to try Zero Sugar Marolyn Hammock Rancher hard candies. They are good for soothing throats and you might like the flavors. They are sold at [employer] and other places.  Please call if we can help

## 2020-07-05 ENCOUNTER — Other Ambulatory Visit: Payer: Self-pay | Admitting: *Deleted

## 2020-07-05 ENCOUNTER — Other Ambulatory Visit: Payer: Self-pay

## 2020-07-05 NOTE — Patient Outreach (Signed)
[facility]) Care Management  07/05/2020  [name] 10/07/1935 [id number]  [facility] outreach to complex care patient [name] was referred to [facility] on 02/11/20 by [facility] post acute care coordinator, Marthenia Rolling [facility] RN CCM  Referral Reason:Please assign to [facility] CMfor complex case management. [facility] SWfor meals, transportation ([place] 02/16/20) and community resources. Transitioned from [facility] on 02/10/20. Lives alone. High risk for readmission. Declined Remote Health. Communication sent to SNF SW to inquire about home health agency. Please see writer's notes from 02/11/20. Please call with questions. Thank you. Marthenia Rolling, MSN-Ed, RN,BSN-THN Post Acute Care [phone]   Insurance:[employer] Admission 01/20/20 to 5/62/13 acute metabolic encephalopathy, 0/8-6/57/84ONG acute metabolic encephalopathy thought secondary to heart block and hypoxia with signs of Klebsiella UTI   Follow up outreach Component Ref Range & Units 4 wk ago  (06/07/20) 5 mo ago  (01/14/20) 8 mo ago  (11/05/19) 1 yr ago  (06/04/19) 1 yr ago  (12/09/18) 2 yr ago  (05/09/18) 2 yr ago  (02/06/18)  Hgb A1c MFr Bld <5.7 % of total Hgb 10.8High  9.8High R, CM  11.7Abnormal R  10.3Abnormal R  10.6Abnormal R  8.8Abnormal R  8.2Abnormal    Reviewed [name] note 06/08/20 and concerns with [name] Note reports the pt was seen by [name]  On 05/20/20 with the patient admitted to missing does of her insulin. Her HgA1c was up over 10 per MD. MD reports pt would not allow the MD CMA to speak with her caregiver.  THN RN CM discussed with [name] that there were concern with her developing more complications and being hospitalized [facility] RN CM inquired of [name] if [facility] RN CM could assist with getting her a referral to an endocrinology, another level of care or to [place] for possible placement in another level of care in which she could receive more  supervision of her insulin intake/diabetes management She has been offered various community resources via mail by [facility] SW and [facility] RN CM but with each outreach follow up on these resources she has various reasons not to acknowledge or consider resources [facility] RN CM redirected patient at intervals when she began to review detailed information of her experiences with endocrinologists and her last hospitalization that she has shared with [facility] RN CM multiple times before. She states "cone won't let [name] be my doctor and endocrinologist" [facility] RN CM explained to [name] again the importance of having a doctor for primary care and a doctor for endocrinology services    [name] reports she did not prefer this redirecting as she could not recall "who I have told what"  She reports she would prefer to speak with "my doctor about this myself. She states she " think I take good care of myself". She reports she no longer have a personal care services (PCS) aide as "I am not able to find one responsible enough to even sweep my floor let alone help me with my diabetes" [name] reports having several personal care aide and difficulties with her PCS agency helping her to find a "good one"  She has previously discussed her attempts to work with her [place] and the staff attempts to match her with an aide of her specific preference. She confirms she does not want to go to another level of care but will agree with her pcp helping her to an endocrinologist. She reports preference to have an endocrinologist  that can also be her pcp. She reports after being seen by Dr Hulen Shouts (?) and Dwyane Dee she does not prefer "a few other endocrinologist in the city"    Plans Patient requests no follow up at this time. "I would prefer that you not call back" She disconnected the outreach call today  [facility] RN CM updated PCP via EPIC in basket  Case closure Note sent to PCP Letters to patient and pcp  Goals Addressed               This Visit's Progress     Patient Stated   .  COMPLETED: [facility]) Manage My Diet (pt-stated)        Follow Up Date 07/05/20   - avoid foods that make my symptoms worse - eat meals at regular times - take time to eat - learn about special diets that can help my symptoms     Notes:  07/05/20 Patient requests no follow up at this time 04/28/20 reports poor appetite and am CBG value of 50 Encouraged her to take time to eat at night, notified pcp    .  COMPLETED: [facility]) Manage My Emotions (pt-stated)        Timeframe:  Short-Term Goal Priority:  Medium Start Date:       04/28/20                      Expected End Date:    07/05/20                   Follow Up Date 05/04/21   - laugh; watch a funny movie or comedian - perform a random act of kindness - talk about feelings with a friend, family or spiritual advisor     Notes:  07/05/20 Patient requests no follow up at this time    .  COMPLETED: (THN) Monitor and Manage My Blood Sugar (pt-stated)        Follow Up Date 05/04/21  - check blood sugar at prescribed times - check blood sugar if I feel it is too high or too low - take the blood sugar meter to all doctor visits   Notes: 07/05/20 Patient requests no follow up at this time 04/28/20 CBG 50, home plan ate meal/juice Gave RN CM permission to notify pcp in case of need of treatment adjustment, RN CM spoke with Raymond Gurney L. Lavina Hamman, RN, BSN, [place] Coordinator Office number [phone] [facility] number [phone] Fax number [phone]

## 2020-07-14 ENCOUNTER — Ambulatory Visit (INDEPENDENT_AMBULATORY_CARE_PROVIDER_SITE_OTHER): Payer: Medicare Other

## 2020-07-14 ENCOUNTER — Other Ambulatory Visit: Payer: Self-pay | Admitting: Internal Medicine

## 2020-07-14 ENCOUNTER — Telehealth: Payer: Self-pay

## 2020-07-14 DIAGNOSIS — I442 Atrioventricular block, complete: Secondary | ICD-10-CM | POA: Diagnosis not present

## 2020-07-14 DIAGNOSIS — F419 Anxiety disorder, unspecified: Secondary | ICD-10-CM

## 2020-07-14 MED ORDER — ALPRAZOLAM 0.5 MG PO TABS: 0.2500 mg | ORAL_TABLET | Freq: Every evening | ORAL | 0 refills | Status: DC | PRN

## 2020-07-14 NOTE — Telephone Encounter (Signed)
Incoming call received from patient requesting a refill for Xanax 0.5 mg 1/2 tablet by mouth at bedtime. Patient states she has taken this medication since 1985 due to diabetic neuropathy that interferes with her sleep.  Patient questioned why we denied request and indicated refill not appropriate. I informed patient that if a medication is not on her current medication list, then it's not appropriate for [place] to provide a refill for it, patient verbalized understanding.  I asked patient if her pill bottles were confirmed at the initial appointment and she replied no.  Patient aware I will send RX request to Dr.Reed and she will determine if it is appropriate to add medication to list and fill (Dr.Reed will have to approve as this is a controlled medication).  Patient would like a return call when complete so she can call [facility] and tell them to deliver medication to her home  Please advise

## 2020-07-14 NOTE — Telephone Encounter (Signed)
RX sent back to Dr.Reed to review and approve as this is a controlled substance

## 2020-07-14 NOTE — Telephone Encounter (Signed)
Agree that meds should have been verified at her first appt.  I recall patient bringing them also so I thought that had been done.  Ok to provide her with the xanax until her next visit.

## 2020-07-16 LAB — CUP PACEART REMOTE DEVICE CHECK
Battery Remaining Longevity: 125 mo
Battery Remaining Percentage: 95.5 %
Battery Voltage: 3.02 V
Brady Statistic AP VP Percent: 1.3 %
Brady Statistic AP VS Percent: 1 %
Brady Statistic AS VP Percent: 68 %
Brady Statistic AS VS Percent: 30 %
Brady Statistic RA Percent Paced: 2.1 %
Brady Statistic RV Percent Paced: 69 %
Date Time Interrogation Session: 20220309020035
Implantable Lead Implant Date: 20210908
Implantable Lead Implant Date: 20210908
Implantable Lead Location: 753859
Implantable Lead Location: 753860
Implantable Pulse Generator Implant Date: 20210908
Lead Channel Impedance Value: 580 Ohm
Lead Channel Impedance Value: 610 Ohm
Lead Channel Pacing Threshold Amplitude: 0.75 V
Lead Channel Pacing Threshold Amplitude: 0.75 V
Lead Channel Pacing Threshold Pulse Width: 0.4 ms
Lead Channel Pacing Threshold Pulse Width: 0.5 ms
Lead Channel Sensing Intrinsic Amplitude: 10.6 mV
Lead Channel Sensing Intrinsic Amplitude: 2.5 mV
Lead Channel Setting Pacing Amplitude: 1 V
Lead Channel Setting Pacing Amplitude: 2 V
Lead Channel Setting Pacing Pulse Width: 0.5 ms
Lead Channel Setting Sensing Sensitivity: 2 mV
Pulse Gen Model: 2272
Pulse Gen Serial Number: 3856705

## 2020-07-22 NOTE — Progress Notes (Signed)
Remote pacemaker transmission.   

## 2020-07-27 ENCOUNTER — Telehealth: Payer: Self-pay | Admitting: Internal Medicine

## 2020-07-27 ENCOUNTER — Telehealth: Payer: Self-pay

## 2020-07-27 NOTE — Telephone Encounter (Signed)
Pt called asking for a letter to be mailed to her stating that she is not able to get back & forth to the mailbox daily do to her health.  Mail carrier states by having this letter, they would bring her mail to the door daily.  Please advise to  have letter mailed Thanks,  Vilinda Blanks

## 2020-07-27 NOTE — Telephone Encounter (Signed)
[name] mailed letter to patient today.

## 2020-07-27 NOTE — Telephone Encounter (Signed)
Letter completed and printed out on printer 4 by [place] desk.

## 2020-07-27 NOTE — Telephone Encounter (Signed)
Letter from Dr.Reed mailed to patient regarding mail being delivered directly to her door. Due to mobility and conditions with breathing.

## 2020-08-11 ENCOUNTER — Other Ambulatory Visit: Payer: Self-pay | Admitting: Internal Medicine

## 2020-08-12 NOTE — Telephone Encounter (Signed)
Pharmacy requested refill Pended Rx and sent to Dr. Sabra Heck for approval due to [place].  Patient has an upcoming appointment 4/13 with Dr. Sabra Heck.

## 2020-08-13 ENCOUNTER — Telehealth: Payer: Self-pay | Admitting: Internal Medicine

## 2020-08-13 ENCOUNTER — Telehealth: Payer: Self-pay | Admitting: *Deleted

## 2020-08-13 DIAGNOSIS — I442 Atrioventricular block, complete: Secondary | ICD-10-CM

## 2020-08-13 DIAGNOSIS — R609 Edema, unspecified: Secondary | ICD-10-CM

## 2020-08-13 DIAGNOSIS — I10 Essential (primary) hypertension: Secondary | ICD-10-CM

## 2020-08-13 MED ORDER — FUROSEMIDE 20 MG PO TABS: 20.0000 mg | ORAL_TABLET | Freq: Every day | ORAL | 11 refills | Status: DC

## 2020-08-13 NOTE — Telephone Encounter (Signed)
Per Dr. Sherron Ales Pt take lasix 20 mg one tablet by mouth daily starting today and daily over the weekend.  Will touch base with Pt on Monday and determine if this has been helpful.  Advised Pt.  She is in agreement.  She requested this nurse call her pharmacy to see if it could be delivered tonight.  Call placed to [facility].  They will make an outreach to the driver to see if they can deliver tonight.  Will check in with Pt on Monday.

## 2020-08-13 NOTE — Telephone Encounter (Signed)
Would recommend getting an appt and being seen If she has any shortness of breath, chest pain or severe leg pain, redness or heat to go to urgent care or hospital to be seen immediately

## 2020-08-13 NOTE — Telephone Encounter (Signed)
Pt c/o swelling: STAT is pt has developed SOB within 24 hours  1) How much weight have you gained and in what time span? Not sure. Patient does not weigh herself   2) If swelling, where is the swelling located? Feet   3) Are you currently taking a fluid pill? no  4) Are you currently SOB? Patient has COPD and is always a little SOB  5) Do you have a log of your daily weights (if so, list)? No. Patient doesn't think her scale even works   6) Have you gained 3 pounds in a day or 5 pounds in a week? Not sure   Patient just started noticing the selling in her feet a day or two ago. She has not done anything unusual. She is worried because her big toes are starting to turn red  7) Have you traveled recently? no

## 2020-08-13 NOTE — Telephone Encounter (Signed)
Patient notified and stated that she spoke with her Cardiologist and he placed her on a "water" pill and told her to schedule an appointment with their office to be seen.  Stated that she is going to follow up with his office.

## 2020-08-13 NOTE — Telephone Encounter (Signed)
Patient called and stated that her feet are so swollen she cannot get her shoes on. Stated that it has been this way for the past week and she thought it would go down on its own. Stated that her feet and legs are swollen and the Left is worse than the right.   Patient stated that she is not on any fluid medication due to COPD. Stated that she has tried elevation.   Stated that it is both legs. Not hurting. Toes are a deep red but her legs are normal in color and not warm to the touch. Stated that she had a Heart Attack last year.   Stated that she was also going to call her Cardiologist Dr. Lovena Le.   Please Advise.

## 2020-08-13 NOTE — Telephone Encounter (Signed)
Tried calling patient, # rings busy

## 2020-08-16 ENCOUNTER — Telehealth: Payer: Self-pay | Admitting: Internal Medicine

## 2020-08-16 NOTE — Telephone Encounter (Signed)
Returned call to Pt.  Per Pt the swelling is improving but not to baseline yet.  She will take lasix for next 4 days and will check on Thursday.  May be able to take PRN.  Will need lab work if needs to continue daily.

## 2020-08-16 NOTE — Telephone Encounter (Signed)
  Patient was calling because Dr Lovena Le told her to follow up with him to let him know how her swelling was since he started her on lasix on 08/13/20. She states that today they are only swollen a little bit so the medication has helped. Should she continue to take the medication?

## 2020-08-18 ENCOUNTER — Other Ambulatory Visit: Payer: Self-pay

## 2020-08-18 ENCOUNTER — Ambulatory Visit (INDEPENDENT_AMBULATORY_CARE_PROVIDER_SITE_OTHER): Payer: Medicare Other | Admitting: Family Medicine

## 2020-08-18 ENCOUNTER — Encounter: Payer: Self-pay | Admitting: Family Medicine

## 2020-08-18 VITALS — BP 119/69 | HR 94 | Temp 97.9°F | Resp 22 | Ht 63.0 in | Wt 159.4 lb

## 2020-08-18 DIAGNOSIS — I442 Atrioventricular block, complete: Secondary | ICD-10-CM | POA: Diagnosis not present

## 2020-08-18 DIAGNOSIS — E1165 Type 2 diabetes mellitus with hyperglycemia: Secondary | ICD-10-CM | POA: Diagnosis not present

## 2020-08-18 DIAGNOSIS — J449 Chronic obstructive pulmonary disease, unspecified: Secondary | ICD-10-CM | POA: Diagnosis not present

## 2020-08-18 DIAGNOSIS — I10 Essential (primary) hypertension: Secondary | ICD-10-CM | POA: Diagnosis not present

## 2020-08-18 LAB — GLUCOSE, POCT (MANUAL RESULT ENTRY): POC Glucose: 82 mg/dl (ref 70–99)

## 2020-08-18 NOTE — Addendum Note (Signed)
Addended by: Logan Bores on: 08/18/2020 12:09 PM   Modules accepted: Orders

## 2020-08-18 NOTE — Patient Instructions (Signed)
Continue with L small as before but do not skip meals.  Will return in 4 months at which time we will repeat your hemoglobin A1c

## 2020-08-18 NOTE — Progress Notes (Signed)
Provider:  Alain Honey, MD  Careteam: Patient Care Team: Wardell Honour, MD as PCP - General (Family Medicine) Raynelle Bring, MD as Consulting Physician (Urology) Evans Lance, MD as Consulting Physician (Cardiology) Deneise Lever, MD as Consulting Physician (Pulmonary Disease) [facility] [name], MD as Consulting Physician (Endocrinology) Newt Minion, MD as Consulting Physician (Orthopedic Surgery) Raynelle Bring, MD as Consulting Physician (Urology)  PLACE OF SERVICE:  [place]  Advanced Directive information    Allergies  Allergen Reactions  . Chlordiazepoxide-Clidinium Other (See Comments)    Sleepy,weak  . 5-Alpha Reductase Inhibitors   . Biaxin [Clarithromycin] Other (See Comments)    Foul taste, abd pain, diarrhea  . Tramadol Other (See Comments)    Caused tremors  . Codeine Other (See Comments)    REACTION: gi upset- only in high doses per pt.  Can tolerate cough syrup with codeine.     No chief complaint on file.    HPI: Patient is a 85 y.o. female she is seen today for medical management of chronic problems chief of which are diabetes and COPD.  Her compliance with both insulin and diet are iffy.  Most recent A1c was 10.82 months ago. Her breathing is stable on chronic oxygen with several inhalers. She had a heart attack back in the fall and has a pacemaker due to block.  Recently she had some dependent edema and was given diuretic by her cardiologist.  She denied increasing shortness of breath with that fluid retention. She tends to ramble with her speech at visit.  Review of Systems:  Review of Systems  Constitutional: Negative.   HENT: Negative.   Eyes: Negative.   Respiratory: Positive for shortness of breath.   Cardiovascular: Positive for leg swelling.  Gastrointestinal: Positive for diarrhea.  Genitourinary: Negative.   Musculoskeletal: Negative.   Neurological: Negative.   Endo/Heme/Allergies:  Negative.   Psychiatric/Behavioral: Negative.   All other systems reviewed and are negative.   Past Medical History:  Diagnosis Date  . Acute and chronic respiratory failure with hypoxia ([place])   . Allergic rhinitis, cause unspecified    Sinus CT Rec 12-23-2009  . Anxiety disorder, unspecified   . Arthritis   . Atrioventricular block, complete ([place])   . Benign paroxysmal positional vertigo 08/09/2012  . 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 Chronic obstructive pulmonary disease, unspecified ([place])   . Chronic rhinitis   . Cognitive communication deficit   . Complication of anesthesia    takes along time to wake up  . Constipation, unspecified   . COPD mixed type ([place])   . Diabetes mellitus   . Diarrhea   . Diverticulosis   . Dizziness and giddiness   . Elevated troponin   . Esophageal reflux   . Esophageal stricture   . Essential (primary) hypertension   . Gastro-esophageal reflux disease without esophagitis   . GERD (gastroesophageal reflux disease)   . Glaucoma   . Hiatal hernia   . History of urinary tract infection   . Hyperlipidemia, unspecified   . Hypertension   . Irritable bowel syndrome   . Leukocytosis   . Metabolic encephalopathy   . Muscle weakness (generalized)   . Neuropathy in diabetes ([place]) 08/09/2012  . Obesity, unspecified   . Oth speech/lang deficits following oth cerebvasc disease   . Other abnormalities of gait and mobility   . Other chronic nonalcoholic liver disease   .  Other forms of scoliosis, site unspecified   . Other specified diseases of liver   . Oxygen deficiency   . Presence of permanent cardiac pacemaker 01/14/2020  . Sciatica   . Scoliosis   . Shingles 2010  . Shortness of breath   . Spinal stenosis, lumbar region, without neurogenic claudication   . Steatohepatitis   . Thrombocytopenia, unspecified ([place])   . Type 2 diabetes mellitus with diabetic polyneuropathy ([place])   .  Type 2 diabetes mellitus with other diabetic neurological complication ([place])   . Unsteadiness on feet    Past Surgical History:  Procedure Laterality Date  . APPENDECTOMY    . CARPAL TUNNEL RELEASE     right hand  . CHOLECYSTECTOMY OPEN  1978  . COLONOSCOPY  07-2001   mild diverticulosis  . ESOPHAGOGASTRODUODENOSCOPY  1093,[id number]   H Hernia,es.stricture s/p dil 1F  . INTRAOCULAR LENS INSERTION Bilateral   . LIVER BIOPSY  [phone]  . PACEMAKER IMPLANT N/A 01/14/2020   Procedure: PACEMAKER IMPLANT;  Surgeon: Evans Lance, MD;  Location: [facility];  Service: Cardiovascular;  Laterality: N/A;  . PARATHYROID EXPLORATION    . TONSILLECTOMY AND ADENOIDECTOMY    . TOTAL ABDOMINAL HYSTERECTOMY    . ULNAR NERVE TRANSPOSITION  12/07/2011   Procedure: ULNAR NERVE DECOMPRESSION/TRANSPOSITION;this was cancelled-not done  Surgeon: Cammie Sickle., MD;  Location: [facility];  Service: Orthopedics;  Laterality: Right;  right ulnar nerve in situ decompression  . ULNAR TUNNEL RELEASE  03/07/2012   Procedure: CUBITAL TUNNEL RELEASE;  Surgeon: Roseanne Kaufman, MD;  Location: [facility];  Service: Orthopedics;  Laterality: Right;  ulnar nerve release at the elbow     Social History:   reports that she quit smoking about 32 years ago. Her smoking use included cigarettes. She has never used smokeless tobacco. She reports that she does not drink alcohol and does not use drugs.  Family History  Problem Relation Age of Onset  . Heart disease Father   . Lung cancer Mother        small cell;Byssinosis  . Lung cancer Sister   . Liver cancer Sister        ? mets from another area of the body  . Diabetes Other        grandmother  . Stroke Maternal Grandfather     Medications: Patient's Medications  New Prescriptions   No medications on file  Previous Medications   ACETAMINOPHEN (TYLENOL) 325 MG TABLET    Take 2 tablets (650 mg total) by mouth every 6 (six) hours  as needed for mild pain (or Fever >/= 101).   ALPRAZOLAM (XANAX) 0.5 MG TABLET    Take 0.5 tablets (0.25 mg total) by mouth at bedtime as needed for up to 30 doses for sleep.   ASPIRIN EC 81 MG TABLET    Take 1 tablet (81 mg total) by mouth daily. Swallow whole.   FUROSEMIDE (LASIX) 20 MG TABLET    Take 1 tablet (20 mg total) by mouth daily.   INSULIN ASPART (NOVOLOG) 100 UNIT/ML INJECTION    Inject 12 Units into the skin 3 (three) times daily before meals.   INSULIN DEGLUDEC (TRESIBA FLEXTOUCH) 200 UNIT/ML FLEXTOUCH PEN    Inject 36 Units into the skin at bedtime.   IPRATROPIUM-ALBUTEROL (DUONEB) 0.5-2.5 (3) MG/3ML SOLN    1 vial in neb every 8 hours and as needed   IRBESARTAN (AVAPRO) 75 MG TABLET    Take 1  tablet (75 mg total) by mouth daily.   LEVALBUTEROL (XOPENEX HFA) 45 MCG/ACT INHALER    Inhale 1-2 puffs into the lungs every 6 (six) hours as needed for wheezing.   LEVALBUTEROL (XOPENEX HFA) 45 MCG/ACT INHALER    Inhale 1-2 puffs every 6 hours as needed   MAGIC MOUTHWASH SOLN    Swish with 2 teaspoonfuls by mouth as needed   METOPROLOL SUCCINATE (TOPROL-XL) 50 MG 24 HR TABLET    Take 1 tablet (50 mg total) by mouth in the morning and at bedtime. Take with or immediately following a meal.   NOVOLOG FLEXPEN 100 UNIT/ML FLEXPEN    INJECT 12-14 UNITS UNDER THE SKIN AT BREAKFAST, 14-16 UNITS AT LUNCH, AND 12-16 UNITS AT DINNER.   NYSTATIN CREAM (MYCOSTATIN)    Apply 1 application topically 2 (two) times daily.   OMEPRAZOLE (PRILOSEC) 20 MG CAPSULE    TAKE (1) CAPSULE DAILY.   POLYETHYLENE GLYCOL (MIRALAX / GLYCOLAX) 17 G PACKET    Take 17 g by mouth daily as needed.   PRAVASTATIN (PRAVACHOL) 20 MG TABLET    Take 1 tablet (20 mg total) by mouth daily.   SODIUM CHLORIDE HYPERTONIC 3 % NEBULIZER SOLUTION    1 ampule neb every 6 hours if needed to clear mucus   TIOTROPIUM BROMIDE-OLODATEROL (STIOLTO RESPIMAT) 2.5-2.5 MCG/ACT AERS    USE 2 PUFFS DAILY  Modified Medications   No medications on file   Discontinued Medications   No medications on file    Physical Exam:  There were no vitals filed for this visit. There is no height or weight on file to calculate BMI. Wt Readings from Last 3 Encounters:  06/30/20 164 lb 3.2 oz (74.5 kg)  06/07/20 164 lb (74.4 kg)  05/14/20 164 lb 12.8 oz (74.8 kg)    Physical Exam Vitals and nursing note reviewed.  Constitutional:      Appearance: Normal appearance.  HENT:     Nose: Nose normal.     Mouth/Throat:     Mouth: Mucous membranes are moist.  Cardiovascular:     Rate and Rhythm: Normal rate and regular rhythm.  Pulmonary:     Effort: Pulmonary effort is normal.     Breath sounds: Normal breath sounds.  Abdominal:     General: Abdomen is flat.     Palpations: Abdomen is soft.  Skin:    General: Skin is warm and dry.  Neurological:     General: No focal deficit present.     Mental Status: She is alert and oriented to person, place, and time.  Psychiatric:        Mood and Affect: Mood normal.        Behavior: Behavior normal.        Thought Content: Thought content normal.     Labs reviewed: Basic Metabolic Panel: Recent Labs    01/13/20 1603 01/14/20 0338 01/15/20 0708 01/18/20 1727 01/20/20 0625 01/22/20 0425 01/30/20 0000 02/02/20 0000 06/07/20 1220  NA  --    < > 139   < > 137 136 133* 134* 140  K  --    < > 4.3   < > 4.3 4.4 5.4* 4.6 4.2  CL  --    < > 100   < > 97* 96* 93* 95* 98  CO2  --    < > 30   < > 30 31 25* 31* 29  GLUCOSE  --    < > 172*   < >  199* 255*  --   --  68  BUN  --    < > 21   < > 8 8 9 8 9   CREATININE 0.82   < > 0.72   < > 0.61 0.73 0.6 0.6 0.63  CALCIUM  --    < > 8.7*   < > 8.9 9.2 9.6 9.6 9.9  MG 1.7  --  2.3  --   --   --   --   --   --   PHOS 3.4  --  3.5  --   --   --   --   --   --   TSH 1.970  --   --   --   --   --   --   --   --    < > = values in this interval not displayed.   Liver Function Tests: Recent Labs    01/13/20 1213 01/14/20 0338 06/07/20 1220  AST 20 23  17   ALT 15 16 11   ALKPHOS 50 50  --   BILITOT 0.8 0.8 0.4  PROT 7.1 6.5 7.8  ALBUMIN 3.4* 3.1*  --    No results for input(s): LIPASE, AMYLASE in the last 8760 hours. No results for input(s): AMMONIA in the last 8760 hours. CBC: Recent Labs    01/13/20 1213 01/13/20 1226 01/20/20 0625 01/22/20 0425 01/30/20 0000 06/07/20 1220  WBC 10.0   < > 9.1 8.6 9.7 8.6  NEUTROABS 8.0*  --   --   --   --  3,844  HGB 12.5   < > 13.2 12.7 14.2 14.9  HCT 41.0   < > 44.1 40.9 45 46.8*  MCV 86.3   < > 87.8 87.4  --  84.2  PLT 167   < > 140* 157 155 198   < > = values in this interval not displayed.   Lipid Panel: Recent Labs    06/07/20 1220  CHOL 178  HDL 55  LDLCALC 101*  TRIG 122  CHOLHDL 3.2   TSH: Recent Labs    01/13/20 1603  TSH 1.970   A1C: Lab Results  Component Value Date   HGBA1C 10.8 (H) 06/07/2020     Assessment/Plan  1. Primary hypertension Blood pressure is good today at 119/69.  She takes irbesartan for blood pressure.  Continue same dose  2. Complete heart block ([place]) Has pacemaker and monitor has had no palpitations or fainting  3. COPD mixed type ([facility]) Chronic oxygen use with inhalers.  Followed by pulmonology.  Appears stable  4. Uncontrolled type 2 diabetes mellitus with hyperglycemia ([place]) As I see her today I think this is her major problem.  It will continue to be a problem with her variable compliance with insulin and diet.  Spent some time today counseling about importance of diet and eating regular meals.   Alain Honey, MD [facility] [phone]

## 2020-08-19 NOTE — Telephone Encounter (Signed)
Call placed to Pt.  Per Pt edema is better than it was.  She will continue daily lasix.   Will have Pt come in for BMP next week.  She will call back with date she can come.  Order for BMP entered.

## 2020-08-20 NOTE — Telephone Encounter (Signed)
Pt scheduled for BMP.    Will follow.

## 2020-08-26 ENCOUNTER — Other Ambulatory Visit: Payer: Self-pay

## 2020-08-26 ENCOUNTER — Other Ambulatory Visit: Payer: Medicare Other

## 2020-08-26 DIAGNOSIS — R609 Edema, unspecified: Secondary | ICD-10-CM

## 2020-08-26 DIAGNOSIS — I10 Essential (primary) hypertension: Secondary | ICD-10-CM

## 2020-08-26 LAB — BASIC METABOLIC PANEL
BUN/Creatinine Ratio: 18 (ref 12–28)
BUN: 13 mg/dL (ref 8–27)
CO2: 26 mmol/L (ref 20–29)
Calcium: 9.6 mg/dL (ref 8.7–10.3)
Chloride: 94 mmol/L — ABNORMAL LOW (ref 96–106)
Creatinine, Ser: 0.74 mg/dL (ref 0.57–1.00)
Glucose: 155 mg/dL — ABNORMAL HIGH (ref 65–99)
Potassium: 4.3 mmol/L (ref 3.5–5.2)
Sodium: 137 mmol/L (ref 134–144)
eGFR: 80 mL/min/{1.73_m2} (ref >59)

## 2020-09-01 ENCOUNTER — Ambulatory Visit (INDEPENDENT_AMBULATORY_CARE_PROVIDER_SITE_OTHER): Payer: Medicare Other | Admitting: Podiatry

## 2020-09-01 ENCOUNTER — Ambulatory Visit: Payer: Medicare Other | Admitting: Podiatry

## 2020-09-01 ENCOUNTER — Other Ambulatory Visit: Payer: Self-pay

## 2020-09-01 ENCOUNTER — Encounter: Payer: Self-pay | Admitting: Podiatry

## 2020-09-01 DIAGNOSIS — D696 Thrombocytopenia, unspecified: Secondary | ICD-10-CM

## 2020-09-01 DIAGNOSIS — M79675 Pain in left toe(s): Secondary | ICD-10-CM

## 2020-09-01 DIAGNOSIS — M79674 Pain in right toe(s): Secondary | ICD-10-CM | POA: Diagnosis not present

## 2020-09-01 DIAGNOSIS — E1142 Type 2 diabetes mellitus with diabetic polyneuropathy: Secondary | ICD-10-CM | POA: Diagnosis not present

## 2020-09-01 DIAGNOSIS — B351 Tinea unguium: Secondary | ICD-10-CM | POA: Insufficient documentation

## 2020-09-01 NOTE — Progress Notes (Signed)
This patient returns to my office for at risk foot care.  This patient requires this care by a professional since this patient will be at risk due to having type 2 diabetes and thrombocytopenia.  This patient is unable to cut nails herself since the patient cannot reach her nails.These nails are painful walking and wearing shoes.  This patient presents for at risk foot care today.  General Appearance  Alert, conversant and in no acute stress.  Vascular  Dorsalis pedis and posterior tibial  pulses are palpable  bilaterally.  Capillary return is within normal limits  bilaterally. Temperature is within normal limits  bilaterally.  Neurologic  Senn-Weinstein monofilament wire test absent   bilaterally. Muscle power within normal limits bilaterally.  Nails Thick disfigured discolored nails with subungual debris  from hallux to fifth toes bilaterally. No evidence of bacterial infection or drainage bilaterally.  Orthopedic  No limitations of motion  feet .  No crepitus or effusions noted.  No bony pathology or digital deformities noted.  Skin  normotropic skin with no porokeratosis noted bilaterally.  No signs of infections or ulcers noted.     Onychomycosis  Pain in right toes  Pain in left toes  Consent was obtained for treatment procedures.   Mechanical debridement of nails 1-5  bilaterally performed with a nail nipper.  Filed with dremel without incident.    Return office visit   3 months                  Told patient to return for periodic foot care and evaluation due to potential at risk complications.   Gardiner Barefoot DPM

## 2020-09-13 ENCOUNTER — Telehealth: Payer: Self-pay | Admitting: *Deleted

## 2020-09-13 DIAGNOSIS — E782 Mixed hyperlipidemia: Secondary | ICD-10-CM

## 2020-09-13 DIAGNOSIS — E1142 Type 2 diabetes mellitus with diabetic polyneuropathy: Secondary | ICD-10-CM

## 2020-09-13 MED ORDER — PRAVASTATIN SODIUM 20 MG PO TABS: 20.0000 mg | ORAL_TABLET | Freq: Every day | ORAL | 1 refills | Status: DC

## 2020-09-13 MED ORDER — CHOLESTYRAMINE 4 G PO PACK: 4.0000 g | PACK | Freq: Two times a day (BID) | ORAL | 12 refills | Status: DC | PRN

## 2020-09-13 NOTE — Telephone Encounter (Signed)
Cholestyramine sent to pharmacy, can take twice daily as needed for diarrhea.

## 2020-09-13 NOTE — Telephone Encounter (Signed)
Patient called and stated that she has IBS. Stated that she has had diarrhea on and off for about 2-3 months.  Wants to know opinion on what she should be taking. Stated that she has taken both but needs refills. Cholestyramine or Lomotil. (Not in current medication list) Stated that she has been doing good until this last week.  Needs refill on what is preferred.   Also requesting refill on Pravastatin. Pended.   Please Advise. (sent message to Janett Billow due to Dr. Sabra Heck not in office)

## 2020-09-13 NOTE — Telephone Encounter (Signed)
Patient had requested to [facility] if she didn't answer.  LMOM with Jessica's response.

## 2020-09-23 ENCOUNTER — Telehealth: Payer: Self-pay | Admitting: Internal Medicine

## 2020-09-23 NOTE — Telephone Encounter (Signed)
Primary Pulmonologist: Young Last office visit and with whom: Young 06/30/20 What do we see them for (pulmonary problems): Acute on chronic respiratory failure with hypoxia, COPD mixed type Last OV assessment/plan:  Assessment & Plan Note by Deneise Lever, MD at 06/30/2020 3:21 PM  Author: Deneise Lever, MD Author Type: Physician Filed: 06/30/2020 3:21 PM  Note Status: Written Cosign: Cosign Not Required Encounter Date: 06/30/2020  Problem: COPD mixed type [facility])  Editor: Deneise Lever, MD (Physician)               She is clear without cough today. We hope to minimize systemic steroids Plan- continue current meds       Assessment & Plan Note by Deneise Lever, MD at 06/30/2020 3:20 PM  Author: Deneise Lever, MD Author Type: Physician Filed: 06/30/2020 3:20 PM  Note Status: Written Cosign: Cosign Not Required Encounter Date: 06/30/2020  Problem: Acute on chronic respiratory failure with hypoxia [facility])  Editor: Deneise Lever, MD (Physician)               She remains dependent on O2 24/7 Plan continue 3-4L       Patient Instructions by Deneise Lever, MD at 06/30/2020 10:30 AM  Author: Deneise Lever, MD Author Type: Physician Filed: 06/30/2020 11:01 AM  Note Status: Signed Cosign: Cosign Not Required Encounter Date: 06/30/2020  Editor: Deneise Lever, MD (Physician)               We can continue oxygen and your current breathing meds  You might like to try Zero Sugar Marolyn Hammock Rancher hard candies. They are good for soothing throats and you might like the flavors. They are sold at [employer] and other places.  Please call if we can help        Instructions     Return in about 6 months (around 12/28/2020).  We can continue oxygen and your current breathing meds  You might like to try Zero Sugar Marolyn Hammock Rancher hard candies. They are good for soothing throats and you might like the flavors. They are sold at [employer] and other places.  Please call if we  can help        Reason for call: Cough with clear mucous for 2 weeks.  Gets so thick in her throat and has trouble getting it up.  Sometimes coughs until she cannot get her breath. She says at times her mucous is stringy.  Sometimes her Taking some OTC cough syrup, helps for a little bit as well as cough drops that helps.  Gets more sob with the cough.  Had to turn oxygen up to 3L, has not turned back 2L and now back to 1.5L  She is more winded than normal.  She is using her Stiolto inhaler and feels like it is helping.  Has not had to use the nebulizer lately.  She has used the Levalbuterol  1 time per day.  Does not use it if she does not have to.  Denies fever, chills or  body aches.  No sick contacts.  Vaccinated for covid and flu.  She wants to know if she needs an antibiotic.  Dr. Annamaria Boots, please advise.    (examples of things to ask: : When did symptoms start? Fever? Cough? Productive? Color to sputum? More sputum than usual? Wheezing? Have you needed increased oxygen? Are you taking your respiratory medications? What over the counter measures have you tried?)  Allergies  Allergen Reactions  .  Chlordiazepoxide-Clidinium Other (See Comments)    Sleepy,weak  . 5-Alpha Reductase Inhibitors   . Biaxin [Clarithromycin] Other (See Comments)    Foul taste, abd pain, diarrhea  . Tramadol Other (See Comments)    Caused tremors  . Codeine Other (See Comments)    REACTION: gi upset- only in high doses per pt.  Can tolerate cough syrup with codeine.     Immunization History  Administered Date(s) Administered  . Fluad Quad(high Dose 65+) 01/30/2019, 03/01/2020  . Influenza Split 01/07/2011, 02/22/2012  . Influenza Whole 05/09/2007, 02/05/2010, 01/22/2017  . Influenza, High Dose Seasonal PF 02/06/2018  . Influenza,inj,Quad PF,6+ Mos 01/21/2013, 03/18/2014, 01/08/2015, 01/13/2016  . Janssen (J&J) SARS-COV-2 Vaccination 01/23/2020  . Moderna SARS-COV2 Booster Vaccination 05/26/2020  .  Pneumococcal Conjugate-13 11/24/2013  . Pneumococcal Polysaccharide-23 04/23/2012

## 2020-09-23 NOTE — Telephone Encounter (Signed)
It sounds more likely to be seasonal pollen irritation, not infection. We should wait on antibiotic. Suggest otc Mucinex 12 mg, twice daily, with water.  Ok to try using nebulizer also.

## 2020-09-23 NOTE — Telephone Encounter (Signed)
I called patient and went over Dr. Thomes Cake. She verbalized understanding and will try the Mucinex. She does not like using the nebs meds. She will call next week if she is not feeling better. Nothing further needed.

## 2020-09-23 NOTE — Telephone Encounter (Signed)
Pt states that she is having a bad cough with clear mucus that comes out. Gets in throat and chokes her to where she cant breathe. Shes been using tylenol and inhaler. Doesn't think its her bronchitis, but is wondering if it may be her COPD. Says that a detailed message may be left if she does not answer. Please advise.

## 2020-10-02 ENCOUNTER — Other Ambulatory Visit: Payer: Self-pay | Admitting: Family

## 2020-10-02 DIAGNOSIS — K219 Gastro-esophageal reflux disease without esophagitis: Secondary | ICD-10-CM

## 2020-10-12 ENCOUNTER — Ambulatory Visit: Payer: Self-pay | Admitting: Family Medicine

## 2020-10-12 ENCOUNTER — Ambulatory Visit (INDEPENDENT_AMBULATORY_CARE_PROVIDER_SITE_OTHER): Payer: Medicare Other | Admitting: Family Medicine

## 2020-10-12 ENCOUNTER — Other Ambulatory Visit: Payer: Self-pay

## 2020-10-12 ENCOUNTER — Encounter: Payer: Self-pay | Admitting: Family Medicine

## 2020-10-12 VITALS — BP 118/64 | HR 85 | Temp 97.1°F | Ht 63.0 in | Wt 163.8 lb

## 2020-10-12 DIAGNOSIS — L219 Seborrheic dermatitis, unspecified: Secondary | ICD-10-CM

## 2020-10-12 DIAGNOSIS — E1165 Type 2 diabetes mellitus with hyperglycemia: Secondary | ICD-10-CM | POA: Diagnosis not present

## 2020-10-12 MED ORDER — MOMETASONE FUROATE 0.1 % EX CREA: TOPICAL_CREAM | CUTANEOUS | 1 refills | Status: DC

## 2020-10-12 NOTE — Patient Instructions (Signed)
Use cream as prescribed twice a day for 2 weeks and then return Continue to watch diet as regards diabetes

## 2020-10-12 NOTE — Progress Notes (Signed)
Provider:  Alain Honey, MD  Careteam: Patient Care Team: Wardell Honour, MD as PCP - General (Family Medicine) Raynelle Bring, MD as Consulting Physician (Urology) Evans Lance, MD as Consulting Physician (Cardiology) Deneise Lever, MD as Consulting Physician (Pulmonary Disease) [facility] [name], MD as Consulting Physician (Endocrinology) Newt Minion, MD as Consulting Physician (Orthopedic Surgery) Raynelle Bring, MD as Consulting Physician (Urology)  PLACE OF SERVICE:  [place]  Advanced Directive information    Allergies  Allergen Reactions  . Chlordiazepoxide-Clidinium Other (See Comments)    Sleepy,weak  . 5-Alpha Reductase Inhibitors   . Biaxin [Clarithromycin] Other (See Comments)    Foul taste, abd pain, diarrhea  . Tramadol Other (See Comments)    Caused tremors  . Codeine Other (See Comments)    REACTION: gi upset- only in high doses per pt.  Can tolerate cough syrup with codeine.     No chief complaint on file.    HPI: Patient is a 85 y.o. female chief complaint today is some spots on her nose times several months.  Symptoms are intermittent.  Primarily in the nasolabial fold but there is also a rough area on the bridge of the nose  Review of Systems:  Review of Systems  Cardiovascular: Negative.   Skin: Positive for rash.       On nose  All other systems reviewed and are negative.   Past Medical History:  Diagnosis Date  . Acute and chronic respiratory failure with hypoxia ([place])   . Allergic rhinitis, cause unspecified    Sinus CT Rec 12-23-2009  . Anxiety disorder, unspecified   . Arthritis   . Atrioventricular block, complete ([place])   . Benign paroxysmal positional vertigo 08/09/2012  . 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 Chronic obstructive pulmonary disease, unspecified ([place])   . Chronic rhinitis   . Cognitive communication deficit   .  Complication of anesthesia    takes along time to wake up  . Constipation, unspecified   . COPD mixed type ([place])   . Diabetes mellitus   . Diarrhea   . Diverticulosis   . Dizziness and giddiness   . Elevated troponin   . Esophageal reflux   . Esophageal stricture   . Essential (primary) hypertension   . Gastro-esophageal reflux disease without esophagitis   . GERD (gastroesophageal reflux disease)   . Glaucoma   . Hiatal hernia   . History of urinary tract infection   . Hyperlipidemia, unspecified   . Hypertension   . Irritable bowel syndrome   . Leukocytosis   . Metabolic encephalopathy   . Muscle weakness (generalized)   . Neuropathy in diabetes ([place]) 08/09/2012  . Obesity, unspecified   . Oth speech/lang deficits following oth cerebvasc disease   . Other abnormalities of gait and mobility   . Other chronic nonalcoholic liver disease   . Other forms of scoliosis, site unspecified   . Other specified diseases of liver   . Oxygen deficiency   . Presence of permanent cardiac pacemaker 01/14/2020  . Sciatica   . Scoliosis   . Shingles 2010  . Shortness of breath   . Spinal stenosis, lumbar region, without neurogenic claudication   . Steatohepatitis   . Thrombocytopenia, unspecified ([place])   . Type 2 diabetes mellitus with diabetic polyneuropathy ([place])   . Type 2 diabetes mellitus with other diabetic neurological complication ([place])   . Unsteadiness  on feet    Past Surgical History:  Procedure Laterality Date  . APPENDECTOMY    . CARPAL TUNNEL RELEASE     right hand  . CHOLECYSTECTOMY OPEN  1978  . COLONOSCOPY  07-2001   mild diverticulosis  . ESOPHAGOGASTRODUODENOSCOPY  [id number]   H Hernia,es.stricture s/p dil 6F  . INTRAOCULAR LENS INSERTION Bilateral   . LIVER BIOPSY  [phone]  . PACEMAKER IMPLANT N/A 01/14/2020   Procedure: PACEMAKER IMPLANT;  Surgeon: Evans Lance, MD;  Location: [place] [facility];  Service: Cardiovascular;  Laterality: N/A;  . PARATHYROID  EXPLORATION    . TONSILLECTOMY AND ADENOIDECTOMY    . TOTAL ABDOMINAL HYSTERECTOMY    . ULNAR NERVE TRANSPOSITION  12/07/2011   Procedure: ULNAR NERVE DECOMPRESSION/TRANSPOSITION;this was cancelled-not done  Surgeon: Cammie Sickle., MD;  Location: [place];  Service: Orthopedics;  Laterality: Right;  right ulnar nerve in situ decompression  . ULNAR TUNNEL RELEASE  03/07/2012   Procedure: CUBITAL TUNNEL RELEASE;  Surgeon: Roseanne Kaufman, MD;  Location: [place];  Service: Orthopedics;  Laterality: Right;  ulnar nerve release at the elbow     Social History:   reports that she quit smoking about 32 years ago. Her smoking use included cigarettes. She has never used smokeless tobacco. She reports that she does not drink alcohol and does not use drugs.  Family History  Problem Relation Age of Onset  . Heart disease Father   . Lung cancer Mother        small cell;Byssinosis  . Lung cancer Sister   . Liver cancer Sister        ? mets from another area of the body  . Diabetes Other        grandmother  . Stroke Maternal Grandfather     Medications: Patient's Medications  New Prescriptions   No medications on file  Previous Medications   ACETAMINOPHEN (TYLENOL) 325 MG TABLET    Take 2 tablets (650 mg total) by mouth every 6 (six) hours as needed for mild pain (or Fever >/= 101).   ALPRAZOLAM (XANAX) 0.5 MG TABLET    Take 0.5 tablets (0.25 mg total) by mouth at bedtime as needed for up to 30 doses for sleep.   ASPIRIN EC 81 MG TABLET    Take 1 tablet (81 mg total) by mouth daily. Swallow whole.   CHOLESTYRAMINE (QUESTRAN) 4 G PACKET    Take 1 packet (4 g total) by mouth 2 (two) times daily as needed. diarrhea   FUROSEMIDE (LASIX) 20 MG TABLET    Take 1 tablet (20 mg total) by mouth daily.   INSULIN ASPART (NOVOLOG) 100 UNIT/ML INJECTION    Inject 12 Units into the skin 3 (three) times daily before meals.   INSULIN DEGLUDEC (TRESIBA FLEXTOUCH) 200 UNIT/ML  FLEXTOUCH PEN    Inject 36 Units into the skin at bedtime.   IPRATROPIUM-ALBUTEROL (DUONEB) 0.5-2.5 (3) MG/3ML SOLN    1 vial in neb every 8 hours and as needed   LEVALBUTEROL (XOPENEX HFA) 45 MCG/ACT INHALER    Inhale 1-2 puffs into the lungs every 6 (six) hours as needed for wheezing.   MAGIC MOUTHWASH SOLN    Swish with 2 teaspoonfuls by mouth as needed   METOPROLOL SUCCINATE (TOPROL-XL) 50 MG 24 HR TABLET    Take 50 mg by mouth 2 (two) times daily. Take with or immediately following a meal.   NOVOLOG FLEXPEN 100 UNIT/ML FLEXPEN  INJECT 12-14 UNITS UNDER THE SKIN AT BREAKFAST, 14-16 UNITS AT LUNCH, AND 12-16 UNITS AT DINNER.   NYSTATIN (MYCOSTATIN) 100000 UNIT/ML SUSPENSION       NYSTATIN CREAM (MYCOSTATIN)    Apply 1 application topically 2 (two) times daily.   OMEPRAZOLE (PRILOSEC) 20 MG CAPSULE    TAKE (1) CAPSULE DAILY.   PRAVASTATIN (PRAVACHOL) 20 MG TABLET    Take 1 tablet (20 mg total) by mouth daily.   SODIUM CHLORIDE HYPERTONIC 3 % NEBULIZER SOLUTION    1 ampule neb every 6 hours if needed to clear mucus   TIOTROPIUM BROMIDE-OLODATEROL (STIOLTO RESPIMAT) 2.5-2.5 MCG/ACT AERS    USE 2 PUFFS DAILY  Modified Medications   No medications on file  Discontinued Medications   No medications on file    Physical Exam:  There were no vitals filed for this visit. There is no height or weight on file to calculate BMI. Wt Readings from Last 3 Encounters:  08/18/20 159 lb 6.4 oz (72.3 kg)  06/30/20 164 lb 3.2 oz (74.5 kg)  06/07/20 164 lb (74.4 kg)    Physical Exam Vitals and nursing note reviewed.  Constitutional:      Appearance: Normal appearance.  HENT:     Head: Normocephalic.  Cardiovascular:     Rate and Rhythm: Normal rate.  Pulmonary:     Effort: Pulmonary effort is normal.     Comments: Using continuous oxygen Skin:    Comments: Patient has some scaliness in the nasolabial folds consistent with seborrheic dermatitis.  On the dorsum of the nose there is a rough area  that could be an actinic keratosis.  Neurological:     Mental Status: She is alert.     Labs reviewed: Basic Metabolic Panel: Recent Labs    01/13/20 1603 01/14/20 0338 01/15/20 0708 01/18/20 1727 01/22/20 0425 01/30/20 0000 02/02/20 0000 06/07/20 1220 08/26/20 1023  NA  --    < > 139   < > 136   < > 134* 140 137  K  --    < > 4.3   < > 4.4   < > 4.6 4.2 4.3  CL  --    < > 100   < > 96*   < > 95* 98 94*  CO2  --    < > 30   < > 31   < > 31* 29 26  GLUCOSE  --    < > 172*   < > 255*  --   --  68 155*  BUN  --    < > 21   < > 8   < > 8 9 13   CREATININE 0.82   < > 0.72   < > 0.73   < > 0.6 0.63 0.74  CALCIUM  --    < > 8.7*   < > 9.2   < > 9.6 9.9 9.6  MG 1.7  --  2.3  --   --   --   --   --   --   PHOS 3.4  --  3.5  --   --   --   --   --   --   TSH 1.970  --   --   --   --   --   --   --   --    < > = values in this interval not displayed.   Liver Function Tests: Recent Labs    01/13/20 1213 01/14/20  3546 06/07/20 1220  AST 20 23 17   ALT 15 16 11   ALKPHOS 50 50  --   BILITOT 0.8 0.8 0.4  PROT 7.1 6.5 7.8  ALBUMIN 3.4* 3.1*  --    No results for input(s): LIPASE, AMYLASE in the last 8760 hours. No results for input(s): AMMONIA in the last 8760 hours. CBC: Recent Labs    01/13/20 1213 01/13/20 1226 01/20/20 0625 01/22/20 0425 01/30/20 0000 06/07/20 1220  WBC 10.0   < > 9.1 8.6 9.7 8.6  NEUTROABS 8.0*  --   --   --   --  3,844  HGB 12.5   < > 13.2 12.7 14.2 14.9  HCT 41.0   < > 44.1 40.9 45 46.8*  MCV 86.3   < > 87.8 87.4  --  84.2  PLT 167   < > 140* 157 155 198   < > = values in this interval not displayed.   Lipid Panel: Recent Labs    06/07/20 1220  CHOL 178  HDL 55  LDLCALC 101*  TRIG 122  CHOLHDL 3.2   TSH: Recent Labs    01/13/20 1603  TSH 1.970   A1C: Lab Results  Component Value Date   HGBA1C 10.8 (H) 06/07/2020     Assessment/Plan  1. Uncontrolled type 2 diabetes mellitus with hyperglycemia [facility]) Patient continues to  "try" to eat the right things.  Last A1c was 10.8.  We will check today - Hemoglobin A1c  2. Seborrheic dermatitis We will try mild steroid cream to affected areas.  May end up freezing area on bridge of nose.   Alain Honey, MD [facility] [phone]

## 2020-10-13 ENCOUNTER — Ambulatory Visit (INDEPENDENT_AMBULATORY_CARE_PROVIDER_SITE_OTHER): Payer: Medicare Other

## 2020-10-13 DIAGNOSIS — I442 Atrioventricular block, complete: Secondary | ICD-10-CM

## 2020-10-13 LAB — HEMOGLOBIN A1C
Hgb A1c MFr Bld: 11 % of total Hgb — ABNORMAL HIGH (ref <5.7)
Mean Plasma Glucose: 269 mg/dL
eAG (mmol/L): 14.9 mmol/L

## 2020-10-13 LAB — CUP PACEART REMOTE DEVICE CHECK
Battery Remaining Longevity: 124 mo
Battery Remaining Percentage: 95.5 %
Battery Voltage: 3.02 V
Brady Statistic AP VP Percent: 1.8 %
Brady Statistic AP VS Percent: 1 %
Brady Statistic AS VP Percent: 83 %
Brady Statistic AS VS Percent: 14 %
Brady Statistic RA Percent Paced: 2.1 %
Brady Statistic RV Percent Paced: 85 %
Date Time Interrogation Session: 20220608020035
Implantable Lead Implant Date: 20210908
Implantable Lead Implant Date: 20210908
Implantable Lead Location: 753859
Implantable Lead Location: 753860
Implantable Pulse Generator Implant Date: 20210908
Lead Channel Impedance Value: 590 Ohm
Lead Channel Impedance Value: 660 Ohm
Lead Channel Pacing Threshold Amplitude: 0.75 V
Lead Channel Pacing Threshold Amplitude: 0.75 V
Lead Channel Pacing Threshold Pulse Width: 0.4 ms
Lead Channel Pacing Threshold Pulse Width: 0.5 ms
Lead Channel Sensing Intrinsic Amplitude: 10.6 mV
Lead Channel Sensing Intrinsic Amplitude: 3.1 mV
Lead Channel Setting Pacing Amplitude: 1 V
Lead Channel Setting Pacing Amplitude: 2 V
Lead Channel Setting Pacing Pulse Width: 0.5 ms
Lead Channel Setting Sensing Sensitivity: 2 mV
Pulse Gen Model: 2272
Pulse Gen Serial Number: 3856705

## 2020-10-14 ENCOUNTER — Other Ambulatory Visit: Payer: Medicare Other

## 2020-10-14 ENCOUNTER — Ambulatory Visit: Payer: Medicare Other | Admitting: Internal Medicine

## 2020-10-21 ENCOUNTER — Telehealth: Payer: Self-pay | Admitting: Internal Medicine

## 2020-10-21 NOTE — Telephone Encounter (Signed)
Patient states she has questions regarding her device. Specifically, she would like to discuss the purpose of her PPM and how it functions. Please return call to discuss when able.   1. Has your device fired? No   2. Is you device beeping? No   3. Are you experiencing draining or swelling at device site? No   4. Are you calling to see if we received your device transmission? No   5. Have you passed out? No

## 2020-10-25 ENCOUNTER — Other Ambulatory Visit: Payer: Self-pay | Admitting: Internal Medicine

## 2020-10-25 DIAGNOSIS — J9611 Chronic respiratory failure with hypoxia: Secondary | ICD-10-CM

## 2020-10-25 NOTE — Telephone Encounter (Signed)
Spoke with pt, explained purpose of PPM.  Advised it cannot prevent her from having a heart attack.  Advised patient if she is having symptoms such as chest pain, lightheadedness/ dizziness, SOB, she needs to report these symptoms to her primary cardiologist.

## 2020-10-26 NOTE — Telephone Encounter (Signed)
I called and spoke with the pt and notified of response per Dr Annamaria Boots. She will call insurance and then let us know. Will keep note open.

## 2020-10-26 NOTE — Telephone Encounter (Signed)
Rx for [name] has been sent to pharmacy in place of Stiolto for pt.  Called and spoke with pt letting her know that the Rx had been sent and she verbalized understanding. Nothing further needed.

## 2020-10-26 NOTE — Telephone Encounter (Signed)
Spoke with the pt  She is in need of a refill of stiolto refill, but she feels like she may need to try something else  She feels like since her MI in Sept 2021 her breathing has not been doing as well and wants to try something new  She states her breathing is not any worse since her last visit here, but just no better  She is using her xopenex inhaler about 2 x daily  Rarely uses neb  Please advise thanks

## 2020-10-26 NOTE — Telephone Encounter (Signed)
We could substitute either Anoro, 1 inhalation daily or Bevespi 2 puffs, twice daily, if her insurance will cover either one. I won't re-order Stiolto until we know what she wants to do.

## 2020-10-26 NOTE — Telephone Encounter (Signed)
Called and spoke with Patient.  Patient called insurance and preferred inhalers are Anoro and Bevespi, in place of Stiolto. Patient stated she notice she was getting winded easily with Stiolto, and felt it no longer worked for her.   Patient requested prescription to [facility].   Message routed to Dr. Annamaria Boots to advise  Allergies  Allergen Reactions   Chlordiazepoxide-Clidinium Other (See Comments)    Sleepy,weak   5-Alpha Reductase Inhibitors    Biaxin [Clarithromycin] Other (See Comments)    Foul taste, abd pain, diarrhea   Tramadol Other (See Comments)    Caused tremors   Codeine Other (See Comments)    REACTION: gi upset- only in high doses per pt.  Can tolerate cough syrup with codeine.    Current Outpatient Medications on File Prior to Visit  Medication Sig Dispense Refill   acetaminophen (TYLENOL) 325 MG tablet Take 2 tablets (650 mg total) by mouth every 6 (six) hours as needed for mild pain (or Fever >/= 101).     ALPRAZolam (XANAX) 0.5 MG tablet Take 0.5 tablets (0.25 mg total) by mouth at bedtime as needed for up to 30 doses for sleep. 15 tablet 0   aspirin EC 81 MG tablet Take 1 tablet (81 mg total) by mouth daily. Swallow whole. 90 tablet 3   cholestyramine (QUESTRAN) 4 g packet Take 1 packet (4 g total) by mouth 2 (two) times daily as needed. diarrhea 60 each 12   furosemide (LASIX) 20 MG tablet Take 1 tablet (20 mg total) by mouth daily. 30 tablet 11   insulin aspart (NOVOLOG) 100 UNIT/ML injection Inject 12 Units into the skin 3 (three) times daily before meals.     insulin degludec (TRESIBA FLEXTOUCH) 200 UNIT/ML FlexTouch Pen Inject 36 Units into the skin at bedtime. 5 mL 3   ipratropium-albuterol (DUONEB) 0.5-2.5 (3) MG/3ML SOLN 1 vial in neb every 8 hours and as needed 90 mL 0   levalbuterol (XOPENEX HFA) 45 MCG/ACT inhaler Inhale 1-2 puffs into the lungs every 6 (six) hours as needed for wheezing. 1 each 0   magic mouthwash SOLN Swish with 2 teaspoonfuls by  mouth as needed 240 mL 0   metoprolol succinate (TOPROL-XL) 50 MG 24 hr tablet Take 50 mg by mouth 2 (two) times daily. Take with or immediately following a meal.     mometasone (ELOCON) 0.1 % cream Apply small amount bid to affected areas 15 g 1   NOVOLOG FLEXPEN 100 UNIT/ML FlexPen INJECT 12-14 UNITS UNDER THE SKIN AT BREAKFAST, 14-16 UNITS AT LUNCH, AND 12-16 UNITS AT DINNER. 15 mL 0   nystatin (MYCOSTATIN) 100000 UNIT/ML suspension      nystatin cream (MYCOSTATIN) Apply 1 application topically 2 (two) times daily. 60 g 0   omeprazole (PRILOSEC) 20 MG capsule TAKE (1) CAPSULE DAILY. 90 capsule 1   pravastatin (PRAVACHOL) 20 MG tablet Take 1 tablet (20 mg total) by mouth daily. 90 tablet 1   sodium chloride HYPERTONIC 3 % nebulizer solution 1 ampule neb every 6 hours if needed to clear mucus 75 mL 0   Tiotropium Bromide-Olodaterol (STIOLTO RESPIMAT) 2.5-2.5 MCG/ACT AERS USE 2 PUFFS DAILY 4 g 4   No current facility-administered medications on file prior to visit.

## 2020-10-26 NOTE — Telephone Encounter (Signed)
Please change her pending order for Stiolto refill to Bevespi, , # 1, inhale 2 puffs, twice daily, refill x 5

## 2020-10-28 ENCOUNTER — Other Ambulatory Visit: Payer: Self-pay | Admitting: Internal Medicine

## 2020-10-29 NOTE — Telephone Encounter (Signed)
Albuterol Sulfate is not on patient's med list. Levalbuterol (Xopenex 45 MCG ) is what's listed. This is the [facility]nd request from [facility]. I spoke with patient who states she will need a refill soon.   Which inhalers would you like her to use? She says she only uses DuoNeb when she gets real bad.

## 2020-10-29 NOTE — Telephone Encounter (Signed)
I refilled albuterol hfa. She used to complain of over stimulation so we used xopenex, but her insurance wanted albuterol and last few times she seems to have done fine with it.

## 2020-11-05 NOTE — Progress Notes (Signed)
Remote pacemaker transmission.   

## 2020-11-09 ENCOUNTER — Telehealth: Payer: Self-pay | Admitting: Internal Medicine

## 2020-11-09 MED ORDER — ANORO ELLIPTA 62.5-25 MCG/INH IN AEPB: 1.0000 | INHALATION_SPRAY | Freq: Every day | RESPIRATORY_TRACT | 11 refills | Status: AC

## 2020-11-09 NOTE — Telephone Encounter (Signed)
Patient is aware of below message/recommendations and voiced her understanding.  She is agreeable with trying anoro. Rx has been sent to preferred pharmacy. Nothing further needed at this time.

## 2020-11-09 NOTE — Telephone Encounter (Signed)
Spoke to patient, who reports of increased sob with exertion, prod cough with clear sputum and post nasal drip. Sx have been present for years but have worsened over the past 2 months.   Using albuterol HFA TID and Bevespi BID. She does not feel that Bevespi is effective. She is not using muxinex.  She wears 2L cont. She does not monitor her oxygen levels. Denied fever, chills or sweats.  Fully vaccinated against covid and flu. She is questioning if she should resume stiolto.   Dr. Annamaria Boots, please advise. Thanks   Current Outpatient Medications on File Prior to Visit  Medication Sig Dispense Refill   acetaminophen (TYLENOL) 325 MG tablet Take 2 tablets (650 mg total) by mouth every 6 (six) hours as needed for mild pain (or Fever >/= 101).     albuterol (VENTOLIN HFA) 108 (90 Base) MCG/ACT inhaler INHALE 2 PUFFS INTO THE LUNGS EVERY 6 HOURS AS NEEDED FOR WHEEZING OR SHORTNESS OF BREATH. 18 g 12   ALPRAZolam (XANAX) 0.5 MG tablet Take 0.5 tablets (0.25 mg total) by mouth at bedtime as needed for up to 30 doses for sleep. 15 tablet 0   aspirin EC 81 MG tablet Take 1 tablet (81 mg total) by mouth daily. Swallow whole. 90 tablet 3   cholestyramine (QUESTRAN) 4 g packet Take 1 packet (4 g total) by mouth 2 (two) times daily as needed. diarrhea 60 each 12   furosemide (LASIX) 20 MG tablet Take 1 tablet (20 mg total) by mouth daily. 30 tablet 11   Glycopyrrolate-Formoterol (BEVESPI AEROSPHERE) 9-4.8 MCG/ACT AERO Inhale 2 puffs into the lungs in the morning and at bedtime. 10.7 g 5   insulin aspart (NOVOLOG) 100 UNIT/ML injection Inject 12 Units into the skin 3 (three) times daily before meals.     insulin degludec (TRESIBA FLEXTOUCH) 200 UNIT/ML FlexTouch Pen Inject 36 Units into the skin at bedtime. 5 mL 3   ipratropium-albuterol (DUONEB) 0.5-2.5 (3) MG/3ML SOLN 1 vial in neb every 8 hours and as needed 90 mL 0   magic mouthwash SOLN Swish with 2 teaspoonfuls by mouth as needed 240 mL 0   metoprolol  succinate (TOPROL-XL) 50 MG 24 hr tablet Take 50 mg by mouth 2 (two) times daily. Take with or immediately following a meal.     mometasone (ELOCON) 0.1 % cream Apply small amount bid to affected areas 15 g 1   NOVOLOG FLEXPEN 100 UNIT/ML FlexPen INJECT 12-14 UNITS UNDER THE SKIN AT BREAKFAST, 14-16 UNITS AT LUNCH, AND 12-16 UNITS AT DINNER. 15 mL 0   nystatin (MYCOSTATIN) 100000 UNIT/ML suspension      nystatin cream (MYCOSTATIN) Apply 1 application topically 2 (two) times daily. 60 g 0   omeprazole (PRILOSEC) 20 MG capsule TAKE (1) CAPSULE DAILY. 90 capsule 1   pravastatin (PRAVACHOL) 20 MG tablet Take 1 tablet (20 mg total) by mouth daily. 90 tablet 1   sodium chloride HYPERTONIC 3 % nebulizer solution 1 ampule neb every 6 hours if needed to clear mucus 75 mL 0   No current facility-administered medications on file prior to visit.    Allergies  Allergen Reactions   Chlordiazepoxide-Clidinium Other (See Comments)    Sleepy,weak   5-Alpha Reductase Inhibitors    Biaxin [Clarithromycin] Other (See Comments)    Foul taste, abd pain, diarrhea   Tramadol Other (See Comments)    Caused tremors   Codeine Other (See Comments)    REACTION: gi upset- only in high doses per pt.  Can tolerate cough syrup with codeine.

## 2020-11-10 ENCOUNTER — Ambulatory Visit: Payer: Medicare Other | Admitting: Family Medicine

## 2020-11-15 ENCOUNTER — Ambulatory Visit: Payer: Medicare Other | Attending: Critical Care Medicine

## 2020-11-15 DIAGNOSIS — Z23 Encounter for immunization: Secondary | ICD-10-CM

## 2020-11-15 NOTE — Progress Notes (Signed)
   Covid-19 Vaccination Clinic  Name:  [name]    MRN: [id number] DOB: 1935-12-05  11/15/2020  [name] was observed post Covid-19 immunization for 15 minutes without incident. She was provided with Vaccine Information Sheet and instruction to access the V-Safe system.   [name] was instructed to call 911 with any severe reactions post vaccine: Difficulty breathing  Swelling of face and throat  A fast heartbeat  A bad rash all over body  Dizziness and weakness   Immunizations Administered     Name Date Dose VIS Date Route   Moderna Covid-19 Booster Vaccine 11/15/2020 11:29 AM 0.25 mL 02/25/2020 Intramuscular   Manufacturer: Moderna   Lot: [id number]   [place]: 48889-169-45

## 2020-11-16 ENCOUNTER — Ambulatory Visit (INDEPENDENT_AMBULATORY_CARE_PROVIDER_SITE_OTHER): Payer: Medicare Other | Admitting: Family Medicine

## 2020-11-16 ENCOUNTER — Other Ambulatory Visit: Payer: Self-pay

## 2020-11-16 ENCOUNTER — Encounter: Payer: Self-pay | Admitting: Family Medicine

## 2020-11-16 VITALS — BP 158/96 | HR 87 | Temp 97.2°F | Ht 63.0 in | Wt 145.0 lb

## 2020-11-16 DIAGNOSIS — E1142 Type 2 diabetes mellitus with diabetic polyneuropathy: Secondary | ICD-10-CM

## 2020-11-16 DIAGNOSIS — E1165 Type 2 diabetes mellitus with hyperglycemia: Secondary | ICD-10-CM | POA: Diagnosis not present

## 2020-11-16 DIAGNOSIS — J449 Chronic obstructive pulmonary disease, unspecified: Secondary | ICD-10-CM | POA: Diagnosis not present

## 2020-11-16 DIAGNOSIS — K580 Irritable bowel syndrome with diarrhea: Secondary | ICD-10-CM | POA: Diagnosis not present

## 2020-11-16 NOTE — Progress Notes (Signed)
Provider:  Alain Honey, MD  Careteam: Patient Care Team: Wardell Honour, MD as PCP - General (Family Medicine) Raynelle Bring, MD as Consulting Physician (Urology) Evans Lance, MD as Consulting Physician (Cardiology) Deneise Lever, MD as Consulting Physician (Pulmonary Disease) [facility] [name], MD as Consulting Physician (Endocrinology) Newt Minion, MD as Consulting Physician (Orthopedic Surgery) Raynelle Bring, MD as Consulting Physician (Urology)  PLACE OF SERVICE:  [place]  Advanced Directive information    Allergies  Allergen Reactions   Chlordiazepoxide-Clidinium Other (See Comments)    Sleepy,weak   5-Alpha Reductase Inhibitors    Biaxin [Clarithromycin] Other (See Comments)    Foul taste, abd pain, diarrhea   Tramadol Other (See Comments)    Caused tremors   Codeine Other (See Comments)    REACTION: gi upset- only in high doses per pt.  Can tolerate cough syrup with codeine.     Chief Complaint  Patient presents with   Medical Management of Chronic Issues    Patient returns to the office for follow up. She recently had pacemaker placed. She would also like to discuss her weight loss and skin on her nose.    Health Maintenance    TDAP, Shigrix, eye exam, dexa and urine microalbumin due.      HPI: Patient is a 85 y.o. female concerned about weight loss.  Has normal appetite.  Does have some diarrhea I have suspicion that her legs as well as diarrhea may be related to autonomic neuropathy She has several small lesions on her nose consistent with actinic keratosis  Review of Systems:  Review of Systems  Constitutional:  Positive for weight loss.  Cardiovascular:  Positive for palpitations.  Gastrointestinal:  Positive for diarrhea.  All other systems reviewed and are negative.  Past Medical History:  Diagnosis Date   Acute and chronic respiratory failure with hypoxia (HCC)    Allergic rhinitis, cause  unspecified    Sinus CT Rec 12-23-2009   Anxiety disorder, unspecified    Arthritis    Atrioventricular block, complete (HCC)    Benign paroxysmal positional vertigo 08/09/2012   Chronic airway obstruction, not elsewhere classified    HFA 75-90% after coaching 12-23-2009   Chronic obstructive pulmonary disease, unspecified ([place])    Chronic rhinitis    Cognitive communication deficit    Complication of anesthesia    takes along time to wake up   Constipation, unspecified    COPD mixed type ([place])    Diabetes mellitus    Diarrhea    Diverticulosis    Dizziness and giddiness    Elevated troponin    Esophageal reflux    Esophageal stricture    Essential (primary) hypertension    Gastro-esophageal reflux disease without esophagitis    GERD (gastroesophageal reflux disease)    Glaucoma    Hiatal hernia    History of urinary tract infection    Hyperlipidemia, unspecified    Hypertension    Irritable bowel syndrome    Leukocytosis    Metabolic encephalopathy    Muscle weakness (generalized)    Neuropathy in diabetes ([place]) 08/09/2012   Obesity, unspecified    Oth speech/lang deficits following oth cerebvasc disease    Other abnormalities of gait and mobility    Other chronic nonalcoholic liver disease    Other forms of scoliosis, site unspecified    Other specified diseases of liver    Oxygen deficiency    Presence of permanent cardiac pacemaker 01/14/2020  Sciatica    Scoliosis    Shingles 2010   Shortness of breath    Spinal stenosis, lumbar region, without neurogenic claudication    Steatohepatitis    Thrombocytopenia, unspecified (HCC)    Type 2 diabetes mellitus with diabetic polyneuropathy ([place])    Type 2 diabetes mellitus with other diabetic neurological complication ([place])    Unsteadiness on feet    Past Surgical History:  Procedure Laterality Date   APPENDECTOMY     CARPAL TUNNEL RELEASE     right hand   CHOLECYSTECTOMY OPEN  1978   COLONOSCOPY  07-2001   mild  diverticulosis   ESOPHAGOGASTRODUODENOSCOPY  1941,74-08   H Hernia,es.stricture s/p dil 86F   INTRAOCULAR LENS INSERTION Bilateral    LIVER BIOPSY  [id number]   PACEMAKER IMPLANT N/A 01/14/2020   Procedure: PACEMAKER IMPLANT;  Surgeon: Evans Lance, MD;  Location: [facility] [facility];  Service: Cardiovascular;  Laterality: N/A;   PARATHYROID EXPLORATION     TONSILLECTOMY AND ADENOIDECTOMY     TOTAL ABDOMINAL HYSTERECTOMY     ULNAR NERVE TRANSPOSITION  12/07/2011   Procedure: ULNAR NERVE DECOMPRESSION/TRANSPOSITION;this was cancelled-not done  Surgeon: Cammie Sickle., MD;  Location: [place];  Service: Orthopedics;  Laterality: Right;  right ulnar nerve in situ decompression   ULNAR TUNNEL RELEASE  03/07/2012   Procedure: CUBITAL TUNNEL RELEASE;  Surgeon: Roseanne Kaufman, MD;  Location: [place];  Service: Orthopedics;  Laterality: Right;  ulnar nerve release at the elbow     Social History:   reports that she quit smoking about 32 years ago. Her smoking use included cigarettes. She has never used smokeless tobacco. She reports that she does not drink alcohol and does not use drugs.  Family History  Problem Relation Age of Onset   Heart disease Father    Lung cancer Mother        small cell;Byssinosis   Lung cancer Sister    Liver cancer Sister        ? mets from another area of the body   Diabetes Other        grandmother   Stroke Maternal Grandfather     Medications: Patient's Medications  New Prescriptions   No medications on file  Previous Medications   ACETAMINOPHEN (TYLENOL) 325 MG TABLET    Take 2 tablets (650 mg total) by mouth every 6 (six) hours as needed for mild pain (or Fever >/= 101).   ALBUTEROL (VENTOLIN HFA) 108 (90 BASE) MCG/ACT INHALER    INHALE 2 PUFFS INTO THE LUNGS EVERY 6 HOURS AS NEEDED FOR WHEEZING OR SHORTNESS OF BREATH.   ALPRAZOLAM (XANAX) 0.5 MG TABLET    Take 0.5 tablets (0.25 mg total) by mouth at bedtime as needed  for up to 30 doses for sleep.   ASPIRIN EC 81 MG TABLET    Take 1 tablet (81 mg total) by mouth daily. Swallow whole.   CHOLESTYRAMINE (QUESTRAN) 4 G PACKET    Take 1 packet (4 g total) by mouth 2 (two) times daily as needed. diarrhea   FUROSEMIDE (LASIX) 20 MG TABLET    Take 1 tablet (20 mg total) by mouth daily.   INSULIN ASPART (NOVOLOG) 100 UNIT/ML INJECTION    Inject 12 Units into the skin 3 (three) times daily before meals.   INSULIN DEGLUDEC (TRESIBA FLEXTOUCH) 200 UNIT/ML FLEXTOUCH PEN    Inject 36 Units into the skin at bedtime.   IPRATROPIUM-ALBUTEROL (DUONEB) 0.5-2.5 (3) MG/3ML SOLN  1 vial in neb every 8 hours and as needed   MAGIC MOUTHWASH SOLN    Swish with 2 teaspoonfuls by mouth as needed   METOPROLOL SUCCINATE (TOPROL-XL) 50 MG 24 HR TABLET    Take 50 mg by mouth 2 (two) times daily. Take with or immediately following a meal.   MOMETASONE (ELOCON) 0.1 % CREAM    Apply small amount bid to affected areas   NOVOLOG FLEXPEN 100 UNIT/ML FLEXPEN    INJECT 12-14 UNITS UNDER THE SKIN AT BREAKFAST, 14-16 UNITS AT LUNCH, AND 12-16 UNITS AT DINNER.   NYSTATIN (MYCOSTATIN) 100000 UNIT/ML SUSPENSION       NYSTATIN CREAM (MYCOSTATIN)    Apply 1 application topically 2 (two) times daily.   OMEPRAZOLE (PRILOSEC) 20 MG CAPSULE    TAKE (1) CAPSULE DAILY.   PRAVASTATIN (PRAVACHOL) 20 MG TABLET    Take 1 tablet (20 mg total) by mouth daily.   SODIUM CHLORIDE HYPERTONIC 3 % NEBULIZER SOLUTION    1 ampule neb every 6 hours if needed to clear mucus   UMECLIDINIUM-VILANTEROL (ANORO ELLIPTA) 62.5-25 MCG/INH AEPB    Inhale 1 puff into the lungs daily.  Modified Medications   No medications on file  Discontinued Medications   No medications on file    Physical Exam:  There were no vitals filed for this visit. There is no height or weight on file to calculate BMI. Wt Readings from Last 3 Encounters:  10/12/20 163 lb 12.8 oz (74.3 kg)  08/18/20 159 lb 6.4 oz (72.3 kg)  06/30/20 164 lb 3.2 oz  (74.5 kg)    Physical Exam Vitals and nursing note reviewed.  Constitutional:      Appearance: Normal appearance.  HENT:     Head: Normocephalic.  Cardiovascular:     Rate and Rhythm: Normal rate and regular rhythm.  Pulmonary:     Effort: Pulmonary effort is normal.     Breath sounds: Normal breath sounds.  Abdominal:     General: Abdomen is flat. Bowel sounds are normal.     Palpations: Abdomen is soft.     Tenderness: There is abdominal tenderness. There is no guarding or rebound.  Skin:    Comments: 2 areas on low-dose frozen with liquid nitrogen.  There is there is a actinic keratosis  Neurological:     General: No focal deficit present.     Mental Status: She is alert and oriented to person, place, and time.  Psychiatric:        Mood and Affect: Mood normal.        Behavior: Behavior normal.        Thought Content: Thought content normal.        Judgment: Judgment normal.    Labs reviewed: Basic Metabolic Panel: Recent Labs    01/13/20 1603 01/14/20 0338 01/15/20 0708 01/18/20 1727 01/22/20 0425 01/30/20 0000 02/02/20 0000 06/07/20 1220 08/26/20 1023  NA  --    < > 139   < > 136   < > 134* 140 137  K  --    < > 4.3   < > 4.4   < > 4.6 4.2 4.3  CL  --    < > 100   < > 96*   < > 95* 98 94*  CO2  --    < > 30   < > 31   < > 31* 29 26  GLUCOSE  --    < > 172*   < >  255*  --   --  68 155*  BUN  --    < > 21   < > 8   < > 8 9 13   CREATININE 0.82   < > 0.72   < > 0.73   < > 0.6 0.63 0.74  CALCIUM  --    < > 8.7*   < > 9.2   < > 9.6 9.9 9.6  MG 1.7  --  2.3  --   --   --   --   --   --   PHOS 3.4  --  3.5  --   --   --   --   --   --   TSH 1.970  --   --   --   --   --   --   --   --    < > = values in this interval not displayed.   Liver Function Tests: Recent Labs    01/13/20 1213 01/14/20 0338 06/07/20 1220  AST 20 23 17   ALT 15 16 11   ALKPHOS 50 50  --   BILITOT 0.8 0.8 0.4  PROT 7.1 6.5 7.8  ALBUMIN 3.4* 3.1*  --    No results for input(s):  LIPASE, AMYLASE in the last 8760 hours. No results for input(s): AMMONIA in the last 8760 hours. CBC: Recent Labs    01/13/20 1213 01/13/20 1226 01/20/20 0625 01/22/20 0425 01/30/20 0000 06/07/20 1220  WBC 10.0   < > 9.1 8.6 9.7 8.6  NEUTROABS 8.0*  --   --   --   --  3,844  HGB 12.5   < > 13.2 12.7 14.2 14.9  HCT 41.0   < > 44.1 40.9 45 46.8*  MCV 86.3   < > 87.8 87.4  --  84.2  PLT 167   < > 140* 157 155 198   < > = values in this interval not displayed.   Lipid Panel: Recent Labs    06/07/20 1220  CHOL 178  HDL 55  LDLCALC 101*  TRIG 122  CHOLHDL 3.2   TSH: Recent Labs    01/13/20 1603  TSH 1.970   A1C: Lab Results  Component Value Date   HGBA1C 11.0 (H) 10/12/2020     Assessment/Plan  1. COPD mixed type ([place]) Followed by pulmonology.  Continuous O2. Normal exam today  2. Irritable bowel syndrome with diarrhea With some left lower quadrant tenderness. Vital signs  3. Uncontrolled type 2 diabetes mellitus with hyperglycemia (HCC) Well-controlled diabetes chronic condition.  A1c was 11  4. Diabetic polyneuropathy associated with type 2 diabetes mellitus ([place]) Peripheral neuropathy seen today for follow-up on related back to diabetes   Alain Honey, MD [place] [phone]

## 2020-11-18 ENCOUNTER — Other Ambulatory Visit: Payer: Self-pay | Admitting: *Deleted

## 2020-11-18 DIAGNOSIS — E1142 Type 2 diabetes mellitus with diabetic polyneuropathy: Secondary | ICD-10-CM

## 2020-11-18 MED ORDER — TRESIBA FLEXTOUCH 200 UNIT/ML ~~LOC~~ SOPN: 36.0000 [IU] | PEN_INJECTOR | Freq: Every day | SUBCUTANEOUS | 3 refills | Status: DC

## 2020-11-18 MED ORDER — NOVOLOG FLEXPEN 100 UNIT/ML ~~LOC~~ SOPN: PEN_INJECTOR | SUBCUTANEOUS | 3 refills | Status: DC

## 2020-11-18 NOTE — Telephone Encounter (Signed)
Patient called requested refills.  Stated that she just saw Dr. Sabra Heck but forgot to have him refill them.  Pended Rx's and sent to [facility] for approval due to [place]. (Dr. Sabra Heck out of office)

## 2020-11-24 ENCOUNTER — Telehealth: Payer: Self-pay | Admitting: *Deleted

## 2020-11-24 NOTE — Telephone Encounter (Signed)
Patient stated that she will call back in the morning when she finds out her caregiver's schedule and make an appointment for tomorrow to see Dinah. Stated that this has been going on for a couple of weeks.

## 2020-11-24 NOTE — Telephone Encounter (Signed)
Patient called and left message on Clinical intake stating that she has been Dizzy and very unstable and wonders what to do. Requesting to speak with Dr. Sabra Heck.   I called patient back but get a busy signal.  Will try again.

## 2020-11-25 ENCOUNTER — Encounter: Payer: Self-pay | Admitting: Family

## 2020-11-25 ENCOUNTER — Other Ambulatory Visit: Payer: Self-pay

## 2020-11-25 ENCOUNTER — Ambulatory Visit: Payer: Medicare Other | Admitting: Family

## 2020-11-25 ENCOUNTER — Encounter: Payer: Medicare Other | Admitting: Family

## 2020-11-25 ENCOUNTER — Ambulatory Visit (INDEPENDENT_AMBULATORY_CARE_PROVIDER_SITE_OTHER): Payer: Medicare Other | Admitting: Family

## 2020-11-25 VITALS — BP 140/80 | HR 96 | Temp 97.7°F | Resp 16 | Ht 63.0 in | Wt 152.4 lb

## 2020-11-25 DIAGNOSIS — Z950 Presence of cardiac pacemaker: Secondary | ICD-10-CM | POA: Diagnosis not present

## 2020-11-25 DIAGNOSIS — E1165 Type 2 diabetes mellitus with hyperglycemia: Secondary | ICD-10-CM

## 2020-11-25 DIAGNOSIS — R42 Dizziness and giddiness: Secondary | ICD-10-CM | POA: Diagnosis not present

## 2020-11-25 DIAGNOSIS — R350 Frequency of micturition: Secondary | ICD-10-CM | POA: Diagnosis not present

## 2020-11-25 LAB — POCT URINALYSIS DIPSTICK
Glucose, UA: NEGATIVE
Ketones, UA: NEGATIVE
Leukocytes, UA: NEGATIVE
Nitrite, UA: POSITIVE
Protein, UA: POSITIVE — AB
Spec Grav, UA: >=1.03 — AB (ref 1.010–1.025)
Urobilinogen, UA: 0.2 E.U./dL
pH, UA: 5 (ref 5.0–8.0)

## 2020-11-25 LAB — CBC WITH DIFFERENTIAL/PLATELET
Absolute Monocytes: 421 cells/uL (ref 200–950)
Basophils Absolute: 81 cells/uL (ref 0–200)
Basophils Relative: 1 %
Eosinophils Absolute: 348 cells/uL (ref 15–500)
Eosinophils Relative: 4.3 %
HCT: 47.8 % — ABNORMAL HIGH (ref 35.0–45.0)
Hemoglobin: 15.2 g/dL (ref 11.7–15.5)
Lymphs Abs: 3013 cells/uL (ref 850–3900)
MCH: 27.3 pg (ref 27.0–33.0)
MCHC: 31.8 g/dL — ABNORMAL LOW (ref 32.0–36.0)
MCV: 85.8 fL (ref 80.0–100.0)
MPV: 12.4 fL (ref 7.5–12.5)
Monocytes Relative: 5.2 %
Neutro Abs: 4236 cells/uL (ref 1500–7800)
Neutrophils Relative %: 52.3 %
Platelets: 174 10*3/uL (ref 140–400)
RBC: 5.57 10*6/uL — ABNORMAL HIGH (ref 3.80–5.10)
RDW: 13.2 % (ref 11.0–15.0)
Total Lymphocyte: 37.2 %
WBC: 8.1 10*3/uL (ref 3.8–10.8)

## 2020-11-25 NOTE — Progress Notes (Signed)
Provider: Matis Monnier FNP-C  Wardell Honour, MD  Patient Care Team: Wardell Honour, MD as PCP - General (Family Medicine) Raynelle Bring, MD as Consulting Physician (Urology) Evans Lance, MD as Consulting Physician (Cardiology) Deneise Lever, MD as Consulting Physician (Pulmonary Disease) [facility] [name], MD as Consulting Physician (Endocrinology) Newt Minion, MD as Consulting Physician (Orthopedic Surgery) Raynelle Bring, MD as Consulting Physician (Urology)  Extended Emergency Contact Information Primary Emergency Contact: [facility] Phone: [phone] Relation: Friend  Code Status:  DNR Goals of care: Advanced Directive information Advanced Directives 11/25/2020  Does Patient Have a Medical Advance Directive? No  Type of Advance Directive -  Does patient want to make changes to medical advance directive? -  Copy of [place] in Chart? -  Would patient like information on creating a medical advance directive? No - Patient declined  Pre-existing out of facility DNR order (yellow form or pink MOST form) -     Chief Complaint  Patient presents with   Acute Visit    Dizziness      HPI:  Pt is a 85 y.o. female seen today for an acute visit for evaluation of dizziness.Has chronic dizziness but states has worsen.States dizziness occurs whenever she stands or sometimes when walking tends to sway her to one side.Nothing relief symptoms.No recent new medication.Has no associated symptoms.Denies any hearing loss,tinnititis,Visual changes,chest discomfort,palpaitation,nausea,vomiting,speech difficulties,Numbness/tingling or weakness of extremities. States her CBG ranging 190's most readings are in the 200's -300's. Has had no fall episode.  Also complains of urine urgency and frequency. No fever,chills,abdominal pain,nausea or vomiting    Past Medical History:  Diagnosis Date   Acute and chronic  respiratory failure with hypoxia (HCC)    Allergic rhinitis, cause unspecified    Sinus CT Rec 12-23-2009   Anxiety disorder, unspecified    Arthritis    Atrioventricular block, complete (HCC)    Benign paroxysmal positional vertigo 08/09/2012   Chronic airway obstruction, not elsewhere classified    HFA 75-90% after coaching 12-23-2009   Chronic obstructive pulmonary disease, unspecified ([place])    Chronic rhinitis    Cognitive communication deficit    Complication of anesthesia    takes along time to wake up   Constipation, unspecified    COPD mixed type ([place])    Diabetes mellitus    Diarrhea    Diverticulosis    Dizziness and giddiness    Elevated troponin    Esophageal reflux    Esophageal stricture    Essential (primary) hypertension    Gastro-esophageal reflux disease without esophagitis    GERD (gastroesophageal reflux disease)    Glaucoma    Hiatal hernia    History of urinary tract infection    Hyperlipidemia, unspecified    Hypertension    Irritable bowel syndrome    Leukocytosis    Metabolic encephalopathy    Muscle weakness (generalized)    Neuropathy in diabetes ([place]) 08/09/2012   Obesity, unspecified    Oth speech/lang deficits following oth cerebvasc disease    Other abnormalities of gait and mobility    Other chronic nonalcoholic liver disease    Other forms of scoliosis, site unspecified    Other specified diseases of liver    Oxygen deficiency    Presence of permanent cardiac pacemaker 01/14/2020   Sciatica    Scoliosis    Shingles 2010   Shortness of breath    Spinal stenosis, lumbar region, without neurogenic claudication  Steatohepatitis    Thrombocytopenia, unspecified ([place])    Type 2 diabetes mellitus with diabetic polyneuropathy ([place])    Type 2 diabetes mellitus with other diabetic neurological complication (HCC)    Unsteadiness on feet    Past Surgical History:  Procedure Laterality Date   APPENDECTOMY     CARPAL TUNNEL RELEASE     right  hand   CHOLECYSTECTOMY OPEN  1978   COLONOSCOPY  07-2001   mild diverticulosis   ESOPHAGOGASTRODUODENOSCOPY  6213,08-65   H Hernia,es.stricture s/p dil 94F   INTRAOCULAR LENS INSERTION Bilateral    LIVER BIOPSY  [id number]   PACEMAKER IMPLANT N/A 01/14/2020   Procedure: PACEMAKER IMPLANT;  Surgeon: Evans Lance, MD;  Location: [facility];  Service: Cardiovascular;  Laterality: N/A;   PARATHYROID EXPLORATION     TONSILLECTOMY AND ADENOIDECTOMY     TOTAL ABDOMINAL HYSTERECTOMY     ULNAR NERVE TRANSPOSITION  12/07/2011   Procedure: ULNAR NERVE DECOMPRESSION/TRANSPOSITION;this was cancelled-not done  Surgeon: Cammie Sickle., MD;  Location: [place];  Service: Orthopedics;  Laterality: Right;  right ulnar nerve in situ decompression   ULNAR TUNNEL RELEASE  03/07/2012   Procedure: CUBITAL TUNNEL RELEASE;  Surgeon: Roseanne Kaufman, MD;  Location: [place];  Service: Orthopedics;  Laterality: Right;  ulnar nerve release at the elbow      Allergies  Allergen Reactions   Chlordiazepoxide-Clidinium Other (See Comments)    Sleepy,weak   5-Alpha Reductase Inhibitors    Biaxin [Clarithromycin] Other (See Comments)    Foul taste, abd pain, diarrhea   Tramadol Other (See Comments)    Caused tremors   Codeine Other (See Comments)    REACTION: gi upset- only in high doses per pt.  Can tolerate cough syrup with codeine.     Outpatient Encounter Medications as of 11/25/2020  Medication Sig   acetaminophen (TYLENOL) 325 MG tablet Take 2 tablets (650 mg total) by mouth every 6 (six) hours as needed for mild pain (or Fever >/= 101).   albuterol (VENTOLIN HFA) 108 (90 Base) MCG/ACT inhaler INHALE 2 PUFFS INTO THE LUNGS EVERY 6 HOURS AS NEEDED FOR WHEEZING OR SHORTNESS OF BREATH.   ALPRAZolam (XANAX) 0.5 MG tablet Take 0.5 tablets (0.25 mg total) by mouth at bedtime as needed for up to 30 doses for sleep.   aspirin EC 81 MG tablet Take 1 tablet (81 mg total) by  mouth daily. Swallow whole.   cholestyramine (QUESTRAN) 4 g packet Take 1 packet (4 g total) by mouth 2 (two) times daily as needed. diarrhea   furosemide (LASIX) 20 MG tablet Take 1 tablet (20 mg total) by mouth daily.   insulin aspart (NOVOLOG FLEXPEN) 100 UNIT/ML FlexPen INJECT 12-14 UNITS UNDER THE SKIN AT BREAKFAST, 14-16 UNITS AT LUNCH, AND 12-16 UNITS AT DINNER.   insulin aspart (NOVOLOG) 100 UNIT/ML injection Inject 12 Units into the skin 3 (three) times daily before meals.   insulin degludec (TRESIBA FLEXTOUCH) 200 UNIT/ML FlexTouch Pen Inject 36 Units into the skin at bedtime.   ipratropium-albuterol (DUONEB) 0.5-2.5 (3) MG/3ML SOLN 1 vial in neb every 8 hours and as needed   magic mouthwash SOLN Swish with 2 teaspoonfuls by mouth as needed   metoprolol succinate (TOPROL-XL) 50 MG 24 hr tablet Take 50 mg by mouth 2 (two) times daily. Take with or immediately following a meal.   mometasone (ELOCON) 0.1 % cream Apply small amount bid to affected areas   nystatin (MYCOSTATIN) 100000 UNIT/ML  suspension    nystatin cream (MYCOSTATIN) Apply 1 application topically 2 (two) times daily.   omeprazole (PRILOSEC) 20 MG capsule TAKE (1) CAPSULE DAILY.   pravastatin (PRAVACHOL) 20 MG tablet Take 1 tablet (20 mg total) by mouth daily.   sodium chloride HYPERTONIC 3 % nebulizer solution 1 ampule neb every 6 hours if needed to clear mucus   umeclidinium-vilanterol (ANORO ELLIPTA) 62.5-25 MCG/INH AEPB Inhale 1 puff into the lungs daily.   No facility-administered encounter medications on file as of 11/25/2020.    Review of Systems  Constitutional:  Negative for appetite change, chills, fatigue, fever and unexpected weight change.  HENT:  Negative for congestion, dental problem, ear discharge, ear pain, facial swelling, hearing loss, nosebleeds, postnasal drip, rhinorrhea, sinus pressure, sinus pain, sneezing, sore throat, tinnitus and trouble swallowing.   Eyes:  Negative for pain, discharge, redness,  itching and visual disturbance.  Respiratory:  Negative for cough, chest tightness, shortness of breath and wheezing.   Cardiovascular:  Negative for chest pain, palpitations and leg swelling.  Gastrointestinal:  Negative for abdominal distention, abdominal pain, blood in stool, constipation, diarrhea, nausea and vomiting.  Endocrine: Negative for cold intolerance, heat intolerance, polydipsia, polyphagia and polyuria.  Genitourinary:  Positive for frequency and urgency. Negative for difficulty urinating, dysuria and flank pain.  Musculoskeletal:  Positive for gait problem. Negative for arthralgias, back pain, joint swelling, myalgias, neck pain and neck stiffness.  Skin:  Negative for color change, pallor, rash and wound.  Neurological:  Positive for dizziness. Negative for syncope, speech difficulty, weakness, light-headedness, numbness and headaches.  Hematological:  Does not bruise/bleed easily.  Psychiatric/Behavioral:  Negative for agitation, behavioral problems, confusion, hallucinations, self-injury, sleep disturbance and suicidal ideas. The patient is not nervous/anxious.    Immunization History  Administered Date(s) Administered   Fluad Quad(high Dose 65+) 01/30/2019, 03/01/2020   Influenza Split 01/07/2011, 02/22/2012   Influenza Whole 05/09/2007, 02/05/2010, 01/22/2017   Influenza, High Dose Seasonal PF 02/06/2018   Influenza,inj,Quad PF,6+ Mos 01/21/2013, 03/18/2014, 01/08/2015, 01/13/2016   Janssen (J&J) SARS-COV-2 Vaccination 01/23/2020   Moderna SARS-COV2 Booster Vaccination 05/26/2020, 11/15/2020   Pneumococcal Conjugate-13 11/24/2013   Pneumococcal Polysaccharide-23 04/23/2012   Pertinent  Health Maintenance Due  Topic Date Due   DEXA SCAN  Never done   URINE MICROALBUMIN  05/11/2018   OPHTHALMOLOGY EXAM  12/28/2018   INFLUENZA VACCINE  12/06/2020   HEMOGLOBIN A1C  04/13/2021   FOOT EXAM  09/01/2021   PNA vac Low Risk Adult  Completed   Fall Risk  11/25/2020  11/16/2020 08/18/2020 03/02/2020 03/01/2020  Falls in the past year? 0 0 1 1 1   Number falls in past yr: 0 0 1 0 1  Injury with Fall? 0 - 1 1 1   Risk for fall due to : No Fall Risks - - - -  Follow up Falls evaluation completed Falls evaluation completed - - -   Functional Status Survey:    Vitals:   11/25/20 1130 11/25/20 1243  BP: 140/80   Pulse: 96   Resp: 16 16  Temp: 97.7 F (36.5 C)   Weight: 152 lb 6.4 oz (69.1 kg)   Height: 5' 3"  (1.6 m)    Body mass index is 27 kg/m. Physical Exam Vitals reviewed.  Constitutional:      General: She is not in acute distress.    Appearance: Normal appearance. She is normal weight. She is not ill-appearing or diaphoretic.     Interventions: Nasal cannula in place.  HENT:  Head: Normocephalic.     Right Ear: Tympanic membrane, ear canal and external ear normal. There is no impacted cerumen.     Left Ear: Tympanic membrane, ear canal and external ear normal. There is no impacted cerumen.     Nose: Nose normal. No congestion or rhinorrhea.     Mouth/Throat:     Mouth: Mucous membranes are moist.     Pharynx: Oropharynx is clear. No oropharyngeal exudate or posterior oropharyngeal erythema.  Eyes:     General: No scleral icterus.       Right eye: No discharge.        Left eye: No discharge.     Extraocular Movements: Extraocular movements intact.     Conjunctiva/sclera: Conjunctivae normal.     Pupils: Pupils are equal, round, and reactive to light.  Neck:     Vascular: No carotid bruit.  Cardiovascular:     Rate and Rhythm: Normal rate and regular rhythm.     Pulses: Normal pulses.     Heart sounds: Normal heart sounds. No murmur heard.   No friction rub. No gallop.  Pulmonary:     Effort: Pulmonary effort is normal. No respiratory distress.     Breath sounds: Normal breath sounds. No wheezing, rhonchi or rales.     Comments: On oxygen via nasal cannula  Chest:     Chest wall: No tenderness.  Abdominal:     General: Bowel  sounds are normal. There is no distension.     Palpations: Abdomen is soft. There is no mass.     Tenderness: There is no abdominal tenderness. There is no right CVA tenderness, left CVA tenderness, guarding or rebound.  Musculoskeletal:        General: No swelling or tenderness. Normal range of motion.     Cervical back: Normal range of motion. No rigidity or tenderness.     Right lower leg: No edema.     Left lower leg: No edema.     Comments: Unsteady gait walks with a cane   Lymphadenopathy:     Cervical: No cervical adenopathy.  Skin:    General: Skin is warm and dry.     Coloration: Skin is not pale.     Findings: No bruising, erythema, lesion or rash.  Neurological:     Mental Status: She is alert and oriented to person, place, and time.     Cranial Nerves: No cranial nerve deficit.     Sensory: No sensory deficit.     Motor: No weakness.     Coordination: Coordination normal.     Gait: Gait normal.  Psychiatric:        Mood and Affect: Mood normal.        Speech: Speech normal.        Behavior: Behavior normal.        Thought Content: Thought content normal.        Judgment: Judgment normal.    Labs reviewed: Recent Labs    01/13/20 1603 01/14/20 0338 01/15/20 0708 01/18/20 1727 01/22/20 0425 01/30/20 0000 02/02/20 0000 06/07/20 1220 08/26/20 1023  NA  --    < > 139   < > 136   < > 134* 140 137  K  --    < > 4.3   < > 4.4   < > 4.6 4.2 4.3  CL  --    < > 100   < > 96*   < > 95* 98 94*  CO2  --    < >  30   < > 31   < > 31* 29 26  GLUCOSE  --    < > 172*   < > 255*  --   --  68 155*  BUN  --    < > 21   < > 8   < > 8 9 13   CREATININE 0.82   < > 0.72   < > 0.73   < > 0.6 0.63 0.74  CALCIUM  --    < > 8.7*   < > 9.2   < > 9.6 9.9 9.6  MG 1.7  --  2.3  --   --   --   --   --   --   PHOS 3.4  --  3.5  --   --   --   --   --   --    < > = values in this interval not displayed.   Recent Labs    01/13/20 1213 01/14/20 0338 06/07/20 1220  AST 20 23 17   ALT 15  16 11   ALKPHOS 50 50  --   BILITOT 0.8 0.8 0.4  PROT 7.1 6.5 7.8  ALBUMIN 3.4* 3.1*  --    Recent Labs    01/13/20 1213 01/13/20 1226 01/20/20 0625 01/22/20 0425 01/30/20 0000 06/07/20 1220  WBC 10.0   < > 9.1 8.6 9.7 8.6  NEUTROABS 8.0*  --   --   --   --  3,844  HGB 12.5   < > 13.2 12.7 14.2 14.9  HCT 41.0   < > 44.1 40.9 45 46.8*  MCV 86.3   < > 87.8 87.4  --  84.2  PLT 167   < > 140* 157 155 198   < > = values in this interval not displayed.   Lab Results  Component Value Date   TSH 1.970 01/13/2020   Lab Results  Component Value Date   HGBA1C 11.0 (H) 10/12/2020   Lab Results  Component Value Date   CHOL 178 06/07/2020   HDL 55 06/07/2020   LDLCALC 101 (H) 06/07/2020   LDLDIRECT 89.0 10/14/2014   TRIG 122 06/07/2020   CHOLHDL 3.2 06/07/2020    Significant Diagnostic Results in last 30 days:  No results found.  Assessment/Plan 1. Status post placement of cardiac pacemaker Reports no chest pain. - EKG 12-Lead indicates sinus Rhythm with right bundle branch block HR 92 b/min previous EKG V-tach.  2. Dizziness Worsening symptoms.No dizziness during the visit . Will rule out anemia and other infectious causes. - CBC with Differential/Platelet  3. Urinary frequency Afebrile. - POC Urinalysis Dipstick abnormal showed yellow cloudy,2+ glucose,moderate blood ,trace protein,Nitrite positive but negative for leukocytes.patient missed the hut placed on the toilet to collect urine specimen.Given urine specimen cup to collect clean catch urine then bring to the office for culture and sensitivity.   4. Uncontrolled type 2 diabetes mellitus with hyperglycemia ([place]) Lab Results  Component Value Date   HGBA1C 11.0 (H) 10/12/2020  CBG uncontrolled most home readings in the 200's-300's  Increase Novolog flex pen from 12 units to 14 units three times daily with meals   - continue on degludec   Family/ staff Communication: Reviewed plan of care with patient verbalized  understanding.  Labs/tests ordered:  - EKG 12-Lead  - POC Urinalysis Dipstick  - CBC with Differential/Platelet  Next Appointment:Has 12/22/2020 appointment with Dr.Miller   Sandrea Hughs, NP

## 2020-11-25 NOTE — Patient Instructions (Signed)
-   increase Novolog from 12 units to 14 units three times daily with meals.  - Please collect urine specimen on provided specimen cup and bring to the office to check for urinary tract infection.

## 2020-11-25 NOTE — Progress Notes (Signed)
Provider: Lener Ventresca FNP-C  Wardell Honour, MD  Patient Care Team: Wardell Honour, MD as PCP - General (Family Medicine) Raynelle Bring, MD as Consulting Physician (Urology) Evans Lance, MD as Consulting Physician (Cardiology) Deneise Lever, MD as Consulting Physician (Pulmonary Disease) [facility] [name], MD as Consulting Physician (Endocrinology) Newt Minion, MD as Consulting Physician (Orthopedic Surgery) Raynelle Bring, MD as Consulting Physician (Urology)  Extended Emergency Contact Information Primary Emergency Contact: [facility] [address] Phone: [phone] Relation: Friend  Code Status:  Full Code  Goals of care: Advanced Directive information Advanced Directives 11/25/2020  Does Patient Have a Medical Advance Directive? No  Type of Advance Directive -  Does patient want to make changes to medical advance directive? -  Copy of [place] in Chart? -  Would patient like information on creating a medical advance directive? No - Patient declined  Pre-existing out of facility DNR order (yellow form or pink MOST form) -     Chief Complaint  Patient presents with   Error    HPI:  Pt is a 85 y.o. female    Past Medical History:  Diagnosis Date   Acute and chronic respiratory failure with hypoxia (HCC)    Allergic rhinitis, cause unspecified    Sinus CT Rec 12-23-2009   Anxiety disorder, unspecified    Arthritis    Atrioventricular block, complete (HCC)    Benign paroxysmal positional vertigo 08/09/2012   Chronic airway obstruction, not elsewhere classified    HFA 75-90% after coaching 12-23-2009   Chronic obstructive pulmonary disease, unspecified ([place])    Chronic rhinitis    Cognitive communication deficit    Complication of anesthesia    takes along time to wake up   Constipation, unspecified    COPD mixed type ([place])    Diabetes mellitus    Diarrhea    Diverticulosis    Dizziness and  giddiness    Elevated troponin    Esophageal reflux    Esophageal stricture    Essential (primary) hypertension    Gastro-esophageal reflux disease without esophagitis    GERD (gastroesophageal reflux disease)    Glaucoma    Hiatal hernia    History of urinary tract infection    Hyperlipidemia, unspecified    Hypertension    Irritable bowel syndrome    Leukocytosis    Metabolic encephalopathy    Muscle weakness (generalized)    Neuropathy in diabetes ([place]) 08/09/2012   Obesity, unspecified    Oth speech/lang deficits following oth cerebvasc disease    Other abnormalities of gait and mobility    Other chronic nonalcoholic liver disease    Other forms of scoliosis, site unspecified    Other specified diseases of liver    Oxygen deficiency    Presence of permanent cardiac pacemaker 01/14/2020   Sciatica    Scoliosis    Shingles 2010   Shortness of breath    Spinal stenosis, lumbar region, without neurogenic claudication    Steatohepatitis    Thrombocytopenia, unspecified ([place])    Type 2 diabetes mellitus with diabetic polyneuropathy ([place])    Type 2 diabetes mellitus with other diabetic neurological complication ([place])    Unsteadiness on feet    Past Surgical History:  Procedure Laterality Date   APPENDECTOMY     CARPAL TUNNEL RELEASE     right hand   CHOLECYSTECTOMY OPEN  1978   COLONOSCOPY  07-2001   mild diverticulosis   ESOPHAGOGASTRODUODENOSCOPY  5956,[id number]  H Hernia,es.stricture s/p dil 62F   INTRAOCULAR LENS INSERTION Bilateral    LIVER BIOPSY  [phone]   PACEMAKER IMPLANT N/A 01/14/2020   Procedure: PACEMAKER IMPLANT;  Surgeon: Evans Lance, MD;  Location: [facility];  Service: Cardiovascular;  Laterality: N/A;   PARATHYROID EXPLORATION     TONSILLECTOMY AND ADENOIDECTOMY     TOTAL ABDOMINAL HYSTERECTOMY     ULNAR NERVE TRANSPOSITION  12/07/2011   Procedure: ULNAR NERVE DECOMPRESSION/TRANSPOSITION;this was cancelled-not done  Surgeon: Cammie Sickle.,  MD;  Location: [facility];  Service: Orthopedics;  Laterality: Right;  right ulnar nerve in situ decompression   ULNAR TUNNEL RELEASE  03/07/2012   Procedure: CUBITAL TUNNEL RELEASE;  Surgeon: Roseanne Kaufman, MD;  Location: [facility];  Service: Orthopedics;  Laterality: Right;  ulnar nerve release at the elbow      Allergies  Allergen Reactions   Chlordiazepoxide-Clidinium Other (See Comments)    Sleepy,weak   5-Alpha Reductase Inhibitors    Biaxin [Clarithromycin] Other (See Comments)    Foul taste, abd pain, diarrhea   Tramadol Other (See Comments)    Caused tremors   Codeine Other (See Comments)    REACTION: gi upset- only in high doses per pt.  Can tolerate cough syrup with codeine.     Outpatient Encounter Medications as of 11/25/2020  Medication Sig   acetaminophen (TYLENOL) 325 MG tablet Take 2 tablets (650 mg total) by mouth every 6 (six) hours as needed for mild pain (or Fever >/= 101).   albuterol (VENTOLIN HFA) 108 (90 Base) MCG/ACT inhaler INHALE 2 PUFFS INTO THE LUNGS EVERY 6 HOURS AS NEEDED FOR WHEEZING OR SHORTNESS OF BREATH.   ALPRAZolam (XANAX) 0.5 MG tablet Take 0.5 tablets (0.25 mg total) by mouth at bedtime as needed for up to 30 doses for sleep.   aspirin EC 81 MG tablet Take 1 tablet (81 mg total) by mouth daily. Swallow whole.   cholestyramine (QUESTRAN) 4 g packet Take 1 packet (4 g total) by mouth 2 (two) times daily as needed. diarrhea   insulin aspart (NOVOLOG FLEXPEN) 100 UNIT/ML FlexPen INJECT 12-14 UNITS UNDER THE SKIN AT BREAKFAST, 14-16 UNITS AT LUNCH, AND 12-16 UNITS AT DINNER.   insulin aspart (NOVOLOG) 100 UNIT/ML injection Inject 12 Units into the skin 3 (three) times daily before meals.   insulin degludec (TRESIBA FLEXTOUCH) 200 UNIT/ML FlexTouch Pen Inject 36 Units into the skin at bedtime.   ipratropium-albuterol (DUONEB) 0.5-2.5 (3) MG/3ML SOLN 1 vial in neb every 8 hours and as needed   magic mouthwash SOLN Swish  with 2 teaspoonfuls by mouth as needed   metoprolol succinate (TOPROL-XL) 50 MG 24 hr tablet Take 50 mg by mouth 2 (two) times daily. Take with or immediately following a meal.   mometasone (ELOCON) 0.1 % cream Apply small amount bid to affected areas   nystatin (MYCOSTATIN) 100000 UNIT/ML suspension    nystatin cream (MYCOSTATIN) Apply 1 application topically 2 (two) times daily.   omeprazole (PRILOSEC) 20 MG capsule TAKE (1) CAPSULE DAILY.   pravastatin (PRAVACHOL) 20 MG tablet Take 1 tablet (20 mg total) by mouth daily.   sodium chloride HYPERTONIC 3 % nebulizer solution 1 ampule neb every 6 hours if needed to clear mucus   umeclidinium-vilanterol (ANORO ELLIPTA) 62.5-25 MCG/INH AEPB Inhale 1 puff into the lungs daily.   furosemide (LASIX) 20 MG tablet Take 1 tablet (20 mg total) by mouth daily.   No facility-administered encounter medications on file  as of 11/25/2020.      Immunization History  Administered Date(s) Administered   Fluad Quad(high Dose 65+) 01/30/2019, 03/01/2020   Influenza Split 01/07/2011, 02/22/2012   Influenza Whole 05/09/2007, 02/05/2010, 01/22/2017   Influenza, High Dose Seasonal PF 02/06/2018   Influenza,inj,Quad PF,6+ Mos 01/21/2013, 03/18/2014, 01/08/2015, 01/13/2016   Janssen (J&J) SARS-COV-2 Vaccination 01/23/2020   Moderna SARS-COV2 Booster Vaccination 05/26/2020, 11/15/2020   Pneumococcal Conjugate-13 11/24/2013   Pneumococcal Polysaccharide-23 04/23/2012   Pertinent  Health Maintenance Due  Topic Date Due   DEXA SCAN  Never done   URINE MICROALBUMIN  05/11/2018   OPHTHALMOLOGY EXAM  12/28/2018   INFLUENZA VACCINE  12/06/2020   HEMOGLOBIN A1C  04/13/2021   FOOT EXAM  09/01/2021   PNA vac Low Risk Adult  Completed   Fall Risk  11/25/2020 11/16/2020 08/18/2020 03/02/2020 03/01/2020  Falls in the past year? 0 0 1 1 1   Number falls in past yr: 0 0 1 0 1  Injury with Fall? 0 - 1 1 1   Risk for fall due to : No Fall Risks - - - -  Follow up Falls  evaluation completed Falls evaluation completed - - -   Functional Status Survey:    Vitals:   There is no height or weight on file to calculate BMI. Physical Exam  Labs reviewed: Recent Labs    01/13/20 1603 01/14/20 0338 01/15/20 0708 01/18/20 1727 01/22/20 0425 01/30/20 0000 02/02/20 0000 06/07/20 1220 08/26/20 1023  NA  --    < > 139   < > 136   < > 134* 140 137  K  --    < > 4.3   < > 4.4   < > 4.6 4.2 4.3  CL  --    < > 100   < > 96*   < > 95* 98 94*  CO2  --    < > 30   < > 31   < > 31* 29 26  GLUCOSE  --    < > 172*   < > 255*  --   --  68 155*  BUN  --    < > 21   < > 8   < > 8 9 13   CREATININE 0.82   < > 0.72   < > 0.73   < > 0.6 0.63 0.74  CALCIUM  --    < > 8.7*   < > 9.2   < > 9.6 9.9 9.6  MG 1.7  --  2.3  --   --   --   --   --   --   PHOS 3.4  --  3.5  --   --   --   --   --   --    < > = values in this interval not displayed.   Recent Labs    01/13/20 1213 01/14/20 0338 06/07/20 1220  AST 20 23 17   ALT 15 16 11   ALKPHOS 50 50  --   BILITOT 0.8 0.8 0.4  PROT 7.1 6.5 7.8  ALBUMIN 3.4* 3.1*  --    Recent Labs    01/13/20 1213 01/13/20 1226 01/22/20 0425 01/30/20 0000 06/07/20 1220 11/25/20 1224  WBC 10.0   < > 8.6 9.7 8.6 8.1  NEUTROABS 8.0*  --   --   --  3,844 4,236  HGB 12.5   < > 12.7 14.2 14.9 15.2  HCT 41.0   < > 40.9 45 46.8*  47.8*  MCV 86.3   < > 87.4  --  84.2 85.8  PLT 167   < > 157 155 198 174   < > = values in this interval not displayed.   Lab Results  Component Value Date   TSH 1.970 01/13/2020   Lab Results  Component Value Date   HGBA1C 11.0 (H) 10/12/2020   Lab Results  Component Value Date   CHOL 178 06/07/2020   HDL 55 06/07/2020   LDLCALC 101 (H) 06/07/2020   LDLDIRECT 89.0 10/14/2014   TRIG 122 06/07/2020   CHOLHDL 3.2 06/07/2020    Significant Diagnostic Results in last 30 days:  No results found.  Assessment/Plan There are no diagnoses linked to this encounter.   Family/ staff Communication:  Reviewed plan of care with patient  Labs/tests ordered: None   Next Appointment:   Sandrea Hughs, NP This encounter was created in error - please disregard.

## 2020-11-26 ENCOUNTER — Other Ambulatory Visit: Payer: Self-pay

## 2020-11-26 ENCOUNTER — Other Ambulatory Visit: Payer: Self-pay | Admitting: *Deleted

## 2020-11-26 DIAGNOSIS — R350 Frequency of micturition: Secondary | ICD-10-CM

## 2020-11-26 NOTE — Progress Notes (Signed)
Patient walked into office with urine. No order was placed. Reviewed Dinah's OV note from yesterday and placed order.

## 2020-11-28 LAB — URINE CULTURE
MICRO NUMBER:: 12155776
SPECIMEN QUALITY:: ADEQUATE

## 2020-11-29 ENCOUNTER — Telehealth: Payer: Self-pay

## 2020-11-29 DIAGNOSIS — N390 Urinary tract infection, site not specified: Secondary | ICD-10-CM

## 2020-11-29 MED ORDER — SACCHAROMYCES BOULARDII 250 MG PO CAPS: 250.0000 mg | ORAL_CAPSULE | Freq: Two times a day (BID) | ORAL | 0 refills | Status: AC

## 2020-11-29 MED ORDER — CIPROFLOXACIN HCL 500 MG PO TABS: 500.0000 mg | ORAL_TABLET | Freq: Two times a day (BID) | ORAL | 0 refills | Status: AC

## 2020-11-29 NOTE — Telephone Encounter (Signed)
Discussed results with patient, patient verbalized understanding of results  RX's sent to pharmacy on file, [facility] (confirmed with patient)

## 2020-11-29 NOTE — Telephone Encounter (Signed)
-----   Message from Sandrea Hughs, NP sent at 11/29/2020 11:56 AM EDT ----- Final urine culture indicates urinary tract infection with > 100,000 colonies Klebsiella bacteria. Start on antibiotic Cipro 500 mg tablet one by mouth twice daily x 7 days  Take along with Probiotic florastor 250 mg capsule one by mouth twice daily x 10 days to prevent antibiotic associated diarrhea.

## 2020-12-01 ENCOUNTER — Ambulatory Visit: Payer: Medicare Other | Admitting: Podiatry

## 2020-12-08 ENCOUNTER — Other Ambulatory Visit: Payer: Self-pay | Admitting: *Deleted

## 2020-12-08 MED ORDER — NOVOLOG FLEXPEN 100 UNIT/ML ~~LOC~~ SOPN: PEN_INJECTOR | SUBCUTANEOUS | 3 refills | Status: DC

## 2020-12-08 NOTE — Telephone Encounter (Signed)
Pharmacy requested refill on medication.  Pended Rx and sent to [facility] for approval due to [place]. (Dr. Sabra Heck out of office)

## 2020-12-09 ENCOUNTER — Telehealth: Payer: Self-pay | Admitting: *Deleted

## 2020-12-09 MED ORDER — FLUCONAZOLE 150 MG PO TABS: 150.0000 mg | ORAL_TABLET | Freq: Once | ORAL | 0 refills | Status: AC

## 2020-12-09 NOTE — Telephone Encounter (Signed)
Patient's # rings busy.

## 2020-12-09 NOTE — Telephone Encounter (Signed)
Patient called and stated that she was seen for a UTI and given Antibiotics. Stated that she finished the antibiotics yesterday and now she has a rash, red and raw in her Vaginal area.   Stated that she is rubbing Aquaphor on it and it helps but still very uncomfortable.   Stated that she cannot come into office because she has no way to get here.   Requesting something to be called in.  Please Advise. (Dr. Sabra Heck out of office)

## 2020-12-09 NOTE — Telephone Encounter (Signed)
Sounds like a yeast infection. Will send in diflucan to treat- to take pill when she gets it. Will work in body for 3 days. If this does not resolve issue will need to do visit.  She can also try otc monostat-3 which she can apply vaginally.

## 2020-12-10 NOTE — Telephone Encounter (Signed)
Patient notified and agreed.  

## 2020-12-20 ENCOUNTER — Telehealth: Payer: Self-pay | Admitting: *Deleted

## 2020-12-20 NOTE — Telephone Encounter (Signed)
Received form from [employer] for Incontinence Supplies throught [place]. For: Chux/Under Pads/150/month Liners/60 month Pull up Diapers/120/month  Placed form in Dr. Sanjuan Dame folder to review and sign.

## 2020-12-21 ENCOUNTER — Telehealth: Payer: Self-pay | Admitting: Internal Medicine

## 2020-12-21 MED ORDER — DOXYCYCLINE HYCLATE 100 MG PO TABS: 100.0000 mg | ORAL_TABLET | Freq: Two times a day (BID) | ORAL | 0 refills | Status: DC

## 2020-12-21 MED ORDER — PREDNISONE 10 MG PO TABS: ORAL_TABLET | ORAL | 0 refills | Status: DC

## 2020-12-21 NOTE — Telephone Encounter (Signed)
I will send in course of Doxycycline and prednisone 66m x 5 days. We needs to take abx with food and wear sunscreen if outdoors. Have her take regular mucinex [id number] a day x 1 week. Due for follow-up with Dr. YoAnnamaria Bootsnd of August. Rx sent to [facility].

## 2020-12-21 NOTE — Telephone Encounter (Signed)
Primary Pulmonologist: Dr. Annamaria Boots Last office visit and with whom: 06/30/20 with Dr. Annamaria Boots What do we see them for (pulmonary problems): Acute respiratory with failure, COPD Last OV assessment/plan: see below   Return in about 6 months (around 12/28/2020). We can continue oxygen and your current breathing meds   You might like to try Zero Sugar Marolyn Hammock Rancher hard candies. They are good for soothing throats and you might like the flavors. They are sold at [employer] and other places.   Please call if we can help          Was appointment offered to patient (explain)?     Reason for call: patient stated she has been more congested and coughing up thick,clear mucous for last few weeks. Also having more SOB at times. Just keeps getting worse. Is now taking Mucinex which helps some. Is  taking rescue inhaler 3 times daily. No other meds. Requesting recs. Wil route to [facility].  Beth, please advise. Thanks!  (examples of things to ask: : When did symptoms start? Fever? Cough? Productive? Color to sputum? More sputum than usual? Wheezing? Have you needed increased oxygen? Are you taking your respiratory medications? What over the counter measures have you tried?)  Allergies  Allergen Reactions   Chlordiazepoxide-Clidinium Other (See Comments)    Sleepy,weak   5-Alpha Reductase Inhibitors    Biaxin [Clarithromycin] Other (See Comments)    Foul taste, abd pain, diarrhea   Tramadol Other (See Comments)    Caused tremors   Codeine Other (See Comments)    REACTION: gi upset- only in high doses per pt.  Can tolerate cough syrup with codeine.     Immunization History  Administered Date(s) Administered   Fluad Quad(high Dose 65+) 01/30/2019, 03/01/2020   Influenza Split 01/07/2011, 02/22/2012   Influenza Whole 05/09/2007, 02/05/2010, 01/22/2017   Influenza, High Dose Seasonal PF 02/06/2018   Influenza,inj,Quad PF,6+ Mos 01/21/2013, 03/18/2014, 01/08/2015, 01/13/2016   Janssen (J&J) SARS-COV-2  Vaccination 01/23/2020   Moderna SARS-COV2 Booster Vaccination 05/26/2020, 11/15/2020   Pneumococcal Conjugate-13 11/24/2013   Pneumococcal Polysaccharide-23 04/23/2012

## 2020-12-21 NOTE — Telephone Encounter (Signed)
I have called and spoke with pt and she is aware of meds sent to the pharmacy.  She will get the mucinex as well.  She already has appt scheduled with CY the end of August.  Nothing further is  needed.

## 2020-12-21 NOTE — Telephone Encounter (Signed)
Pt stated that she has been coughing up a lot of mucus and she said that when she gets up in the morning she can barely breathe and she said that she has not been breathing really well she can tell a difference in her breathing also she stated that she tends to cough throughout the night. Pt stated that it is clear mucus but at times she said that it can be yellow in color and is requesting for an antibiotic.  Pharmacy; [facility] [address]  [place], [place] [id number]   Pls regard; [phone]

## 2020-12-21 NOTE — Telephone Encounter (Signed)
Attempted to call pt but line rings busy.  Tried to call back two more times and the line still rang busy. Will try to call back later.

## 2020-12-22 ENCOUNTER — Ambulatory Visit (INDEPENDENT_AMBULATORY_CARE_PROVIDER_SITE_OTHER): Payer: Medicare Other | Admitting: Family Medicine

## 2020-12-22 ENCOUNTER — Other Ambulatory Visit: Payer: Self-pay

## 2020-12-22 ENCOUNTER — Encounter: Payer: Self-pay | Admitting: Family Medicine

## 2020-12-22 VITALS — BP 152/88 | HR 71 | Temp 97.1°F | Ht 63.0 in | Wt 156.0 lb

## 2020-12-22 DIAGNOSIS — G9341 Metabolic encephalopathy: Secondary | ICD-10-CM

## 2020-12-22 DIAGNOSIS — N390 Urinary tract infection, site not specified: Secondary | ICD-10-CM | POA: Diagnosis not present

## 2020-12-22 DIAGNOSIS — I10 Essential (primary) hypertension: Secondary | ICD-10-CM | POA: Diagnosis not present

## 2020-12-22 NOTE — Patient Instructions (Signed)
How to Perform the Epley Maneuver The Epley maneuver is an exercise that relieves symptoms of vertigo. Vertigo is the feeling that you or your surroundings are moving when they are not. When you feel vertigo, you may feel like the room is spinning and may have trouble walking. The Epley maneuver is used for a type of vertigo caused by a calcium deposit in a part of the inner ear. The maneuver involves changing headpositions to help the deposit move out of the area. You can do this maneuver at home whenever you have symptoms of vertigo. You canrepeat it in 24 hours if your vertigo has not gone away. Even though the Epley maneuver may relieve your vertigo for a few weeks, it is possible that your symptoms will return. This maneuver relieves vertigo, but itdoes not relieve dizziness. What are the risks? If it is done correctly, the Epley maneuver is considered safe. Sometimes it can lead to dizziness or nausea that goes away after a short time. If you develop other symptoms--such as changes in vision, weakness, or numbness--stopdoing the maneuver and call your health care provider. Supplies needed: A bed or table. A pillow. How to do the Epley maneuver     Sit on the edge of a bed or table with your back straight and your legs extended or hanging over the edge of the bed or table. Turn your head halfway toward the affected ear or side as told by your health care provider. Lie backward quickly with your head turned until you are lying flat on your back. Your head should dangle (head-hanging position). You may want to position a pillow under your shoulders. Hold this position for at least 30 seconds. If you feel dizzy or have symptoms of vertigo, continue to hold the position until the symptoms stop. Turn your head to the opposite direction until your unaffected ear is facing down. Your head should continue to dangle. Hold this position for at least 30 seconds. If you feel dizzy or have symptoms of  vertigo, continue to hold the position until the symptoms stop. Turn your whole body to the same side as your head so that you are positioned on your side. Your head will now be nearly facedown and no longer needs to dangle. Hold for at least 30 seconds. If you feel dizzy or have symptoms of vertigo, continue to hold the position until the symptoms stop. Sit back up. You can repeat the maneuver in 24 hours if your vertigo does not go away. Follow these instructions at home: For 24 hours after doing the Epley maneuver: Keep your head in an upright position. When lying down to sleep or rest, keep your head raised (elevated) with two or more pillows. Avoid excessive neck movements. Activity Do not drive or use machinery if you feel dizzy. After doing the Epley maneuver, return to your normal activities as told by your health care provider. Ask your health care provider what activities are safe for you. General instructions Drink enough fluid to keep your urine pale yellow. Do not drink alcohol. Take over-the-counter and prescription medicines only as told by your health care provider. Keep all follow-up visits. This is important. Preventing vertigo symptoms Ask your health care provider if there is anything you should do at home to prevent vertigo. He or she may recommend that you: Keep your head elevated with two or more pillows while you sleep. Do not sleep on the side of your affected ear. Get up slowly from bed.  Avoid sudden movements during the day. Avoid extreme head positions or movement, such as looking up or bending over. Contact a health care provider if: Your vertigo gets worse. You have other symptoms, including: Nausea. Vomiting. Headache. Get help right away if you: Have vision changes. Have a headache or neck pain that is severe or getting worse. Cannot stop vomiting. Have new numbness or weakness in any part of your body. These symptoms may represent a serious problem  that is an emergency. Do not wait to see if the symptoms will go away. Get medical help right away. Call your local emergency services (911 in the [place] Do not drive yourself to the hospital. Summary Vertigo is the feeling that you or your surroundings are moving when they are not. The Epley maneuver is an exercise that relieves symptoms of vertigo. If the Epley maneuver is done correctly, it is considered safe. This information is not intended to replace advice given to you by your health care provider. Make sure you discuss any questions you have with your healthcare provider. Document Revised: 03/24/2020 Document Reviewed: 03/24/2020 Elsevier Patient Education  2022 [employer].

## 2020-12-22 NOTE — Progress Notes (Signed)
Provider:  Alain Honey, MD  Careteam: Patient Care Team: Wardell Honour, MD as PCP - General (Family Medicine) Raynelle Bring, MD as Consulting Physician (Urology) Evans Lance, MD as Consulting Physician (Cardiology) Deneise Lever, MD as Consulting Physician (Pulmonary Disease) [facility] [name], MD as Consulting Physician (Endocrinology) Newt Minion, MD as Consulting Physician (Orthopedic Surgery) Raynelle Bring, MD as Consulting Physician (Urology)  PLACE OF SERVICE:  [place]  Advanced Directive information    Allergies  Allergen Reactions   Chlordiazepoxide-Clidinium Other (See Comments)    Sleepy,weak   5-Alpha Reductase Inhibitors    Biaxin [Clarithromycin] Other (See Comments)    Foul taste, abd pain, diarrhea   Tramadol Other (See Comments)    Caused tremors   Codeine Other (See Comments)    REACTION: gi upset- only in high doses per pt.  Can tolerate cough syrup with codeine.     No chief complaint on file.    HPI: Patient is a 85 y.o. female.No new complaints today. Continues on chronic O2. Seems to worry about her memory, but di 3 words at 5 min and clock drawing which she performs with no issue. Talks a lot about her pacer. Had UTI since last visit with me: Klebsiella treated with Cipro Continues to disregard importance on managing sugar with A1C running above 10  Review of Systems:  Review of Systems  Constitutional: Negative.   HENT: Negative.    Eyes: Negative.   Respiratory:  Positive for cough and shortness of breath.   Genitourinary: Negative.   Musculoskeletal: Negative.   All other systems reviewed and are negative.  Past Medical History:  Diagnosis Date   Acute and chronic respiratory failure with hypoxia (HCC)    Allergic rhinitis, cause unspecified    Sinus CT Rec 12-23-2009   Anxiety disorder, unspecified    Arthritis    Atrioventricular block, complete (HCC)    Benign paroxysmal  positional vertigo 08/09/2012   Chronic airway obstruction, not elsewhere classified    HFA 75-90% after coaching 12-23-2009   Chronic obstructive pulmonary disease, unspecified ([place])    Chronic rhinitis    Cognitive communication deficit    Complication of anesthesia    takes along time to wake up   Constipation, unspecified    COPD mixed type ([place])    Diabetes mellitus    Diarrhea    Diverticulosis    Dizziness and giddiness    Elevated troponin    Esophageal reflux    Esophageal stricture    Essential (primary) hypertension    Gastro-esophageal reflux disease without esophagitis    GERD (gastroesophageal reflux disease)    Glaucoma    Hiatal hernia    History of urinary tract infection    Hyperlipidemia, unspecified    Hypertension    Irritable bowel syndrome    Leukocytosis    Metabolic encephalopathy    Muscle weakness (generalized)    Neuropathy in diabetes ([place]) 08/09/2012   Obesity, unspecified    Oth speech/lang deficits following oth cerebvasc disease    Other abnormalities of gait and mobility    Other chronic nonalcoholic liver disease    Other forms of scoliosis, site unspecified    Other specified diseases of liver    Oxygen deficiency    Presence of permanent cardiac pacemaker 01/14/2020   Sciatica    Scoliosis    Shingles 2010   Shortness of breath    Spinal stenosis, lumbar region, without neurogenic claudication  Steatohepatitis    Thrombocytopenia, unspecified ([place])    Type 2 diabetes mellitus with diabetic polyneuropathy ([place])    Type 2 diabetes mellitus with other diabetic neurological complication (HCC)    Unsteadiness on feet    Past Surgical History:  Procedure Laterality Date   APPENDECTOMY     CARPAL TUNNEL RELEASE     right hand   CHOLECYSTECTOMY OPEN  1978   COLONOSCOPY  07-2001   mild diverticulosis   ESOPHAGOGASTRODUODENOSCOPY  3295,18-84   H Hernia,es.stricture s/p dil 33F   INTRAOCULAR LENS INSERTION Bilateral    LIVER BIOPSY   [id number]   PACEMAKER IMPLANT N/A 01/14/2020   Procedure: PACEMAKER IMPLANT;  Surgeon: Evans Lance, MD;  Location: [facility];  Service: Cardiovascular;  Laterality: N/A;   PARATHYROID EXPLORATION     TONSILLECTOMY AND ADENOIDECTOMY     TOTAL ABDOMINAL HYSTERECTOMY     ULNAR NERVE TRANSPOSITION  12/07/2011   Procedure: ULNAR NERVE DECOMPRESSION/TRANSPOSITION;this was cancelled-not done  Surgeon: Cammie Sickle., MD;  Location: [facility];  Service: Orthopedics;  Laterality: Right;  right ulnar nerve in situ decompression   ULNAR TUNNEL RELEASE  03/07/2012   Procedure: CUBITAL TUNNEL RELEASE;  Surgeon: Roseanne Kaufman, MD;  Location: [facility];  Service: Orthopedics;  Laterality: Right;  ulnar nerve release at the elbow     Social History:   reports that she quit smoking about 32 years ago. Her smoking use included cigarettes. She has never used smokeless tobacco. She reports that she does not drink alcohol and does not use drugs.  Family History  Problem Relation Age of Onset   Heart disease Father    Lung cancer Mother        small cell;Byssinosis   Lung cancer Sister    Liver cancer Sister        ? mets from another area of the body   Diabetes Other        grandmother   Stroke Maternal Grandfather     Medications: Patient's Medications  New Prescriptions   No medications on file  Previous Medications   ACETAMINOPHEN (TYLENOL) 325 MG TABLET    Take 2 tablets (650 mg total) by mouth every 6 (six) hours as needed for mild pain (or Fever >/= 101).   ALBUTEROL (VENTOLIN HFA) 108 (90 BASE) MCG/ACT INHALER    INHALE 2 PUFFS INTO THE LUNGS EVERY 6 HOURS AS NEEDED FOR WHEEZING OR SHORTNESS OF BREATH.   ALPRAZOLAM (XANAX) 0.5 MG TABLET    Take 0.5 tablets (0.25 mg total) by mouth at bedtime as needed for up to 30 doses for sleep.   ASPIRIN EC 81 MG TABLET    Take 1 tablet (81 mg total) by mouth daily. Swallow whole.   CHOLESTYRAMINE (QUESTRAN) 4 G  PACKET    Take 1 packet (4 g total) by mouth 2 (two) times daily as needed. diarrhea   DOXYCYCLINE (VIBRA-TABS) 100 MG TABLET    Take 1 tablet (100 mg total) by mouth 2 (two) times daily.   FUROSEMIDE (LASIX) 20 MG TABLET    Take 1 tablet (20 mg total) by mouth daily.   INSULIN ASPART (NOVOLOG FLEXPEN) 100 UNIT/ML FLEXPEN    INJECT 12-14 UNITS UNDER THE SKIN AT BREAKFAST, 14-16 UNITS AT LUNCH, AND 12-16 UNITS AT DINNER.   INSULIN DEGLUDEC (TRESIBA FLEXTOUCH) 200 UNIT/ML FLEXTOUCH PEN    Inject 36 Units into the skin at bedtime.   IPRATROPIUM-ALBUTEROL (DUONEB) 0.5-2.5 (3) MG/3ML SOLN  1 vial in neb every 8 hours and as needed   MAGIC MOUTHWASH SOLN    Swish with 2 teaspoonfuls by mouth as needed   METOPROLOL SUCCINATE (TOPROL-XL) 50 MG 24 HR TABLET    Take 50 mg by mouth 2 (two) times daily. Take with or immediately following a meal.   MOMETASONE (ELOCON) 0.1 % CREAM    Apply small amount bid to affected areas   NYSTATIN (MYCOSTATIN) 100000 UNIT/ML SUSPENSION       NYSTATIN CREAM (MYCOSTATIN)    Apply 1 application topically 2 (two) times daily.   OMEPRAZOLE (PRILOSEC) 20 MG CAPSULE    TAKE (1) CAPSULE DAILY.   PRAVASTATIN (PRAVACHOL) 20 MG TABLET    Take 1 tablet (20 mg total) by mouth daily.   PREDNISONE (DELTASONE) 10 MG TABLET    Take 2 tabs x 5 days   SODIUM CHLORIDE HYPERTONIC 3 % NEBULIZER SOLUTION    1 ampule neb every 6 hours if needed to clear mucus  Modified Medications   No medications on file  Discontinued Medications   No medications on file    Physical Exam:  There were no vitals filed for this visit. There is no height or weight on file to calculate BMI. Wt Readings from Last 3 Encounters:  11/25/20 152 lb 6.4 oz (69.1 kg)  11/16/20 145 lb (65.8 kg)  10/12/20 163 lb 12.8 oz (74.3 kg)    Physical Exam Vitals and nursing note reviewed.  Constitutional:      Appearance: Normal appearance.  Cardiovascular:     Rate and Rhythm: Normal rate. Rhythm irregular.   Pulmonary:     Effort: Pulmonary effort is normal.     Breath sounds: Normal breath sounds.  Abdominal:     General: Abdomen is flat. Bowel sounds are normal.  Neurological:     General: No focal deficit present.     Mental Status: She is alert and oriented to person, place, and time.  Psychiatric:        Mood and Affect: Mood normal.        Behavior: Behavior normal.    Labs reviewed: Basic Metabolic Panel: Recent Labs    01/13/20 1603 01/14/20 0338 01/15/20 0708 01/18/20 1727 01/22/20 0425 01/30/20 0000 02/02/20 0000 06/07/20 1220 08/26/20 1023  NA  --    < > 139   < > 136   < > 134* 140 137  K  --    < > 4.3   < > 4.4   < > 4.6 4.2 4.3  CL  --    < > 100   < > 96*   < > 95* 98 94*  CO2  --    < > 30   < > 31   < > 31* 29 26  GLUCOSE  --    < > 172*   < > 255*  --   --  68 155*  BUN  --    < > 21   < > 8   < > 8 9 13   CREATININE 0.82   < > 0.72   < > 0.73   < > 0.6 0.63 0.74  CALCIUM  --    < > 8.7*   < > 9.2   < > 9.6 9.9 9.6  MG 1.7  --  2.3  --   --   --   --   --   --   PHOS 3.4  --  3.5  --   --   --   --   --   --  TSH 1.970  --   --   --   --   --   --   --   --    < > = values in this interval not displayed.   Liver Function Tests: Recent Labs    01/13/20 1213 01/14/20 0338 06/07/20 1220  AST 20 23 17   ALT 15 16 11   ALKPHOS 50 50  --   BILITOT 0.8 0.8 0.4  PROT 7.1 6.5 7.8  ALBUMIN 3.4* 3.1*  --    No results for input(s): LIPASE, AMYLASE in the last 8760 hours. No results for input(s): AMMONIA in the last 8760 hours. CBC: Recent Labs    01/13/20 1213 01/13/20 1226 01/22/20 0425 01/30/20 0000 06/07/20 1220 11/25/20 1224  WBC 10.0   < > 8.6 9.7 8.6 8.1  NEUTROABS 8.0*  --   --   --  3,844 4,236  HGB 12.5   < > 12.7 14.2 14.9 15.2  HCT 41.0   < > 40.9 45 46.8* 47.8*  MCV 86.3   < > 87.4  --  84.2 85.8  PLT 167   < > 157 155 198 174   < > = values in this interval not displayed.   Lipid Panel: Recent Labs    06/07/20 1220  CHOL 178   HDL 55  LDLCALC 101*  TRIG 122  CHOLHDL 3.2   TSH: Recent Labs    01/13/20 1603  TSH 1.970   A1C: Lab Results  Component Value Date   HGBA1C 11.0 (H) 10/12/2020     Assessment/Plan  1. Acute metabolic encephalopathy Apparently occurred around time she received pacer. Simple tests here today do not support dementi  2. Primary hypertension Takes Toprol; BP at 152/88  3. Lower urinary tract infectious disease As noted frequent infection. Most recent bacteria was Klebsiella   Alain Honey, MD [facility] [phone]

## 2020-12-27 NOTE — Progress Notes (Deleted)
Patient ID: [name], female    DOB: 24-Feb-1936, 85 y.o.   MRN: [id number]  HPI Female former smoker followed for COPD, chronic hypoxic respiratory failure, complicated by chronic rhinitis, GERD, DM, glaucoma 2012- Walk test Patient Saturations on Room Air while Ambulating = 84% Patient Saturations on 2.5 Liters of oxygen while Ambulating = 92%  PFT: 01/16/2012 severe obstructive airways disease with insignificant response to bronchodilator, air trapping, diffusion moderately reduced. FEV1 0.82/45%, FEV1/FEC 0.46. Emphysema pattern on the loop. TLC 100%, RV 148%, DLCO 47%. ---------------------------------------------------------------------  06/30/20- 85 year old female former smoker followed for COPD, chronic hypoxic respiratory failure, complicated by chronic rhinitis, GERD, DM2,  back pain/scoliosis, HTN, Heart Block/ Pacemaker, Sjogren's, Thrombocytopenia,  O2 3-3.5 L/ [employer] Arrival sat 93% on portable Covid vax- 1 Moderna, 1 J&J Flu vax- had Generally intolerant of Beta adrenergic stimulants Xopenex HFA, Stiolto 2.5 Respimat, neb Duoneb, Astelin, We sent Zpak and prednisone 20 mg daily x 5 days on 05/19/20. Sleeps in recliner. PCP notes her as having mild cognitive impairment with memory loss. -----Patient states she has good and bad days with her breathing, states her cough came back last week Describes mild dry cough. Agreed we need to stay away from prednisone due to her DM. She never tolerated stimulant inhalers well. Doesn't notice much wheezing.  Resists sample trial of Breztri due to listed side effects, so we agreed to stay with what we are doing.   12/28/20- 85 year old female former smoker followed for COPD, chronic hypoxic respiratory failure, complicated by chronic rhinitis, GERD, DM2,  back pain/scoliosis, HTN, Heart Block/ Pacemaker, Sjogren's, Thrombocytopenia,  O2 3-3.5 L/ [employer]  -Xopenex HFA, Stiolto 2.5 Respimat, neb Duoneb, Astelin, Covid vax-    ROS-see  HPI  + = positive Constitutional:   No-   weight loss, night sweats, fevers, chills, fatigue, lassitude. HEENT:   No-  headaches, difficulty swallowing, tooth/dental problems, sore throat,       No-  sneezing, itching, ear ache, nasal congestion, post nasal drip,  CV:  No-   chest pain, orthopnea, PND, swelling in lower extremities, anasarca,                                                   Dizziness,+ palpitations Resp: +shortness of breath with exertion or at rest.               +productive cough,  + non-productive cough,  No- coughing up of blood.                 change in color of mucus.   wheezing.   Skin: No-   rash or lesions. GI:  +heartburn, indigestion, No-abdominal pain, nausea, vomiting,  GU: . MS:  No-   joint pain or swelling.   + back pain Neuro-     nothing unusual Psych:  No- change in mood or affect. +depression or anxiety.  + memory loss.    Objective:  OBJ- Physical Exam    Arrival O2 sat 93% on POC General- Alert, Oriented, Affect-appropriate, Distress- none acute,  Skin- rash-none, lesions- none, excoriation- none  Lymphadenopathy- none Head- atraumatic            Eyes- Gross vision intact, PERRLA, conjunctivae and secretions clear            Ears- Hearing, canals-normal  Nose- Clear, no-Septal dev, mucus, polyps, erosion, perforation             Throat- Mallampati IV , mucosa -no thrush , drainage- none, tonsils- atrophic,                                      stridor-none,  + missing teeth Neck- flexible , trachea midline, no stridor , thyroid nl, carotid no bruit Chest - symmetrical excursion , unlabored           Heart/CV- RRR , no murmur , no gallop  , no rub, nl s1 s2                           - JVD- none , edema- none, stasis changes- none, varices- none           Lung-  + clear, unlabored, wheeze-none, cough- none, dullness-none, rub- none           Chest wall- +L pacemaker Abd-  Br/ Gen/ Rectal- Not done, not indicated Extrem- cyanosis- none,  clubbing, none, atrophy- none, strength- , + wheelchair Neuro- grossly intact to observation       l6

## 2020-12-28 ENCOUNTER — Ambulatory Visit: Payer: Medicare Other | Admitting: Internal Medicine

## 2020-12-29 ENCOUNTER — Telehealth: Payer: Self-pay

## 2020-12-29 ENCOUNTER — Telehealth: Payer: Self-pay | Admitting: Internal Medicine

## 2020-12-29 DIAGNOSIS — E782 Mixed hyperlipidemia: Secondary | ICD-10-CM

## 2020-12-29 DIAGNOSIS — L219 Seborrheic dermatitis, unspecified: Secondary | ICD-10-CM

## 2020-12-29 NOTE — Telephone Encounter (Signed)
New Message:    Patient says she is forgetting things and it seems to be getting worse. She says she is also extremely tired. She says she would like to talk to the nurse about what is going on with her.

## 2020-12-29 NOTE — Telephone Encounter (Signed)
Patient called and left voicemail on clinical intake voicemail. She stated that she was wanting results of test that Dr. Alain Honey did on an area that was removed from her nose. I tried calling patient back but phone was ringing busy. I looked in patient's chart and saw no record of any testing being done to anything being removed from nose. Please advise.  Message routed to Dr. Alain Honey

## 2020-12-30 NOTE — Telephone Encounter (Signed)
Returned call to Pt.  She is concerned about her struggle to remember things and sometimes when she is talking she struggles with words.  She would like to know if there is a neurologist that Dr. Lovena Le would recommend.  She would also like to know what Dr. Lovena Le recommends for covid boosters going forward.  She has been boosted twice.

## 2021-01-04 NOTE — Telephone Encounter (Signed)
Message routed to Dr. Alain Honey again. Please advise.Patient very concerned about possible cancer.Doesn't seem to be getting better.

## 2021-01-05 NOTE — Telephone Encounter (Signed)
[name] Honour, MD to Carroll Kinds, CMA   11:36 AM Can refer to derm but when lesion frozen, there is no biopsy      Patient notified and stated that she would like the Dermatology referral.  Order placed but not signed. Please add Diagnosis.

## 2021-01-12 ENCOUNTER — Ambulatory Visit (INDEPENDENT_AMBULATORY_CARE_PROVIDER_SITE_OTHER): Payer: Medicare Other

## 2021-01-12 DIAGNOSIS — I442 Atrioventricular block, complete: Secondary | ICD-10-CM

## 2021-01-12 LAB — CUP PACEART REMOTE DEVICE CHECK
Battery Remaining Longevity: 118 mo
Battery Remaining Percentage: 93 %
Battery Voltage: 3.02 V
Brady Statistic AP VP Percent: 1.5 %
Brady Statistic AP VS Percent: 1 %
Brady Statistic AS VP Percent: 77 %
Brady Statistic AS VS Percent: 20 %
Brady Statistic RA Percent Paced: 2.1 %
Brady Statistic RV Percent Paced: 79 %
Date Time Interrogation Session: 20220907020022
Implantable Lead Implant Date: 20210908
Implantable Lead Implant Date: 20210908
Implantable Lead Location: 753859
Implantable Lead Location: 753860
Implantable Pulse Generator Implant Date: 20210908
Lead Channel Impedance Value: 580 Ohm
Lead Channel Impedance Value: 580 Ohm
Lead Channel Pacing Threshold Amplitude: 0.75 V
Lead Channel Pacing Threshold Amplitude: 0.75 V
Lead Channel Pacing Threshold Pulse Width: 0.4 ms
Lead Channel Pacing Threshold Pulse Width: 0.5 ms
Lead Channel Sensing Intrinsic Amplitude: 11.2 mV
Lead Channel Sensing Intrinsic Amplitude: 3.1 mV
Lead Channel Setting Pacing Amplitude: 1 V
Lead Channel Setting Pacing Amplitude: 2 V
Lead Channel Setting Pacing Pulse Width: 0.5 ms
Lead Channel Setting Sensing Sensitivity: 2 mV
Pulse Gen Model: 2272
Pulse Gen Serial Number: 3856705

## 2021-01-17 ENCOUNTER — Telehealth: Payer: Self-pay | Admitting: Internal Medicine

## 2021-01-17 DIAGNOSIS — J9611 Chronic respiratory failure with hypoxia: Secondary | ICD-10-CM

## 2021-01-17 MED ORDER — IPRATROPIUM-ALBUTEROL 0.5-2.5 (3) MG/3ML IN SOLN
RESPIRATORY_TRACT | 0 refills | Status: DC
Start: 2021-01-17 — End: 2022-01-13

## 2021-01-17 MED ORDER — PREDNISONE 10 MG PO TABS: 10.0000 mg | ORAL_TABLET | Freq: Every day | ORAL | 0 refills | Status: DC

## 2021-01-17 NOTE — Telephone Encounter (Signed)
She has a nebulizer machine with Duoneb. Suggest she try using that 2 timees daily  Also please ask if the light prednisone course we sent her August 6 helped any at that time?  Thaks.

## 2021-01-17 NOTE — Telephone Encounter (Signed)
I called pt and notified of response. I refilled her Duoneb. She states that she thinks that the prednisone did help her back in August and it does not run her BP up the way that it used to. Let me know if you want to have [place] send her in some. Thanks!

## 2021-01-17 NOTE — Telephone Encounter (Signed)
Spoke with the pt  She is c/o increased SOB with or without exertion past 2 wks  She states that her chest feels heavy  She denies any coughing, wheezing, leg swelling  She states only using her xopenex inhaler- uses every 6 hours  She has not had any fevers or aches She wants to try and avoid appt due to transportation issues  Please advise thanks!  Allergies  Allergen Reactions   Chlordiazepoxide-Clidinium Other (See Comments)    Sleepy,weak   5-Alpha Reductase Inhibitors    Biaxin [Clarithromycin] Other (See Comments)    Foul taste, abd pain, diarrhea   Tramadol Other (See Comments)    Caused tremors   Codeine Other (See Comments)    REACTION: gi upset- only in high doses per pt.  Can tolerate cough syrup with codeine.

## 2021-01-17 NOTE — Telephone Encounter (Signed)
Please send prednisone 10 mg, # 5, 1 daily x 5 days

## 2021-01-17 NOTE — Telephone Encounter (Signed)
Rx for pred was sent to pharm  Pt aware  Nothing further needed

## 2021-01-17 NOTE — Telephone Encounter (Signed)
Pt is having difficulty breathing. States that it feels like heavu pressure on her chest, and that whatever Dr. Annamaria Boots prescribed her wasn't working so she stopped taking it. Is currently using albuterol. Also states that she had a pace maker placed a few months ago and that it might be the cause of her poor breathing. Pharmacy of choice is [facility]. Please advise.

## 2021-01-18 ENCOUNTER — Telehealth: Payer: Self-pay | Admitting: Internal Medicine

## 2021-01-18 NOTE — Telephone Encounter (Signed)
Called and spoke with pt. Pt said she has not used her nebulizer machine in years and when she went to use it, machine would not come on. Pt called DME and DME told her what she needed to do to try to get it charged. Stated to pt to call us tomorrow if she is still having issues after letting machine charge overnight and pt verbalized understanding. Nothing further needed.

## 2021-01-19 ENCOUNTER — Telehealth: Payer: Self-pay | Admitting: Internal Medicine

## 2021-01-19 DIAGNOSIS — J441 Chronic obstructive pulmonary disease with (acute) exacerbation: Secondary | ICD-10-CM

## 2021-01-19 NOTE — Telephone Encounter (Addendum)
Called and spoke with patient. She states she did not have the money to buy a new nebulizer machines. States it is a little over $200 with [employer]. She states she is going to call [place]. States she was told that she could possibly get one for free. Patient states she will call in the morning and will let us know if we should send it to [facility].

## 2021-01-19 NOTE — Telephone Encounter (Signed)
Patient called in regards to her nebulizer machine not working. She spoke with her DME company yesterday. They advised her to charge it over night, and stated if it doesn't work to reach out to [place]. Patient states her machine still does not work. Is it ok to send in a new order?  Dr. Annamaria Boots please advise  Thanks

## 2021-01-19 NOTE — Telephone Encounter (Signed)
Yes thanks- please order replacement compressor nebulizer for dx OSA

## 2021-01-19 NOTE — Telephone Encounter (Signed)
Correction- sorry- dx for [name] is COPD

## 2021-01-20 MED ORDER — TRELEGY ELLIPTA 100-62.5-25 MCG/INH IN AEPB: 1.0000 | INHALATION_SPRAY | Freq: Every day | RESPIRATORY_TRACT | 6 refills | Status: DC

## 2021-01-20 NOTE — Progress Notes (Signed)
Remote pacemaker transmission.   

## 2021-01-20 NOTE — Telephone Encounter (Signed)
Called and spoke with Patient.  Patient stated she was told by her Pharmacist that she may need a long term inhaler for maintenance.   Patient stated she had tried Anoro, but never felt like it help her breathing. Patient stated she has received neb medication.  Advised order was placed for new neb machine yesterday.  Message routed to Dr. Annamaria Boots to advise on inhaler

## 2021-01-20 NOTE — Telephone Encounter (Signed)
Called and spoke with Patient.  Dr. Janee Morn recommendations given.  Understanding stated.  Trelegy prescription sent to [facility] for Patient delivery.  Nothing further at this time.

## 2021-01-20 NOTE — Telephone Encounter (Signed)
Suggest we send her Trelegy 100   # 1, ref x 12     Inhale 1 puff then rinse mouth once daily See how she does with that.

## 2021-01-28 ENCOUNTER — Telehealth: Payer: Self-pay | Admitting: Internal Medicine

## 2021-01-28 DIAGNOSIS — J449 Chronic obstructive pulmonary disease, unspecified: Secondary | ICD-10-CM

## 2021-01-28 NOTE — Telephone Encounter (Signed)
Spoke with the pt and she states received new neb machine from [employer] She says that the tech dropped it off and it was not put together for her yet  She is unable to assemble this machine herself, and her aid was not able to either  Urgent order sent to [employer] to have them help her with this  She is c/o runny nose- no other symptoms- advised try otc antihistamine like zyrtec She will call back if this does not help  Nothing further needed

## 2021-01-31 ENCOUNTER — Telehealth: Payer: Self-pay

## 2021-01-31 NOTE — Telephone Encounter (Signed)
Spoke with Pt.  Advised if Pt was interested in seeing a neurologist Dr. Lovena Le recommends Dr. Floyde Parkins.  Pt indicates understanding.

## 2021-02-01 ENCOUNTER — Telehealth: Payer: Self-pay | Admitting: Internal Medicine

## 2021-02-01 NOTE — Telephone Encounter (Signed)
Ok to use a held spot, this week or next.

## 2021-02-01 NOTE — Telephone Encounter (Signed)
ATC Patient.  LM to call back to get scheduled with Dr. Annamaria Boots.

## 2021-02-01 NOTE — Telephone Encounter (Signed)
Called and spoke with pt stating to her that it is recommended for her to get a flu shot and pt wanted to know when she should get it. Pt wanted to know if she should make an appt. Stated to pt that I saw that she had an appt that was scheduled with CY 8/23 but pt said that she did not even remember that she had the appt scheduled due to having a lot of problems happening recently.  Pt wanted to know if we could work her in for an appt so she could also get the flu shot. Dr. Annamaria Boots, please advise if we can work pt into your schedule next week per pt if you have any held slots.

## 2021-02-03 NOTE — Telephone Encounter (Signed)
Pt scheduled to see CY on 02/08/2021. Will close encounter.

## 2021-02-08 ENCOUNTER — Other Ambulatory Visit: Payer: Self-pay

## 2021-02-08 ENCOUNTER — Ambulatory Visit (INDEPENDENT_AMBULATORY_CARE_PROVIDER_SITE_OTHER): Payer: Medicare Other

## 2021-02-08 ENCOUNTER — Encounter: Payer: Self-pay | Admitting: Internal Medicine

## 2021-02-08 ENCOUNTER — Ambulatory Visit (INDEPENDENT_AMBULATORY_CARE_PROVIDER_SITE_OTHER): Payer: Medicare Other | Admitting: Internal Medicine

## 2021-02-08 VITALS — BP 136/74 | HR 88 | Ht 63.0 in | Wt 156.0 lb

## 2021-02-08 DIAGNOSIS — J449 Chronic obstructive pulmonary disease, unspecified: Secondary | ICD-10-CM

## 2021-02-08 DIAGNOSIS — R0789 Other chest pain: Secondary | ICD-10-CM

## 2021-02-08 DIAGNOSIS — Z23 Encounter for immunization: Secondary | ICD-10-CM

## 2021-02-08 DIAGNOSIS — I700 Atherosclerosis of aorta: Secondary | ICD-10-CM | POA: Diagnosis not present

## 2021-02-08 NOTE — Progress Notes (Signed)
Patient ID: [name], female    DOB: 24-Jul-1935, 85 y.o.   MRN: [id number]  HPI Female former smoker followed for COPD, chronic hypoxic respiratory failure, complicated by chronic rhinitis, GERD, DM, glaucoma 2012- Walk test Patient Saturations on Room Air while Ambulating = 84% Patient Saturations on 2.5 Liters of oxygen while Ambulating = 92%  PFT: 01/16/2012 severe obstructive airways disease with insignificant response to bronchodilator, air trapping, diffusion moderately reduced. FEV1 0.82/45%, FEV1/FEC 0.46. Emphysema pattern on the loop. TLC 100%, RV 148%, DLCO 47%. ---------------------------------------------------------------------  06/30/20- 85 year old female former smoker followed for COPD, chronic hypoxic respiratory failure, complicated by chronic rhinitis, GERD, DM2,  back pain/scoliosis, HTN, Heart Block/ Pacemaker, Sjogren's, Thrombocytopenia,  O2 3-3.5 L/ [employer] Arrival sat 93% on portable Covid vax- 1 Moderna, 1 J&J Flu vax- had Generally intolerant of Beta adrenergic stimulants Xopenex HFA, Stiolto 2.5 Respimat, neb Duoneb, Astelin, We sent Zpak and prednisone 20 mg daily x 5 days on 05/19/20. Sleeps in recliner. PCP notes her as having mild cognitive impairment with memory loss. -----Patient states she has good and bad days with her breathing, states her cough came back last week Describes mild dry cough. Agreed we need to stay away from prednisone due to her DM. She never tolerated stimulant inhalers well. Doesn't notice much wheezing.  Resists sample trial of Breztri due to listed side effects, so we agreed to stay with what we are doing.     02/07/21- 85 year old female former smoker followed for COPD, chronic hypoxic respiratory failure, complicated by chronic rhinitis, GERD, DM2,  back pain/scoliosis, HTN, Heart Block/ Pacemaker, Sjogren's, Thrombocytopenia,  O2 3-3.5 L/ [employer] Arrival sat 93% on portable Covid vax- 1 Moderna, 1 J&J                                       Here with an aide today Flu vax- -albuterol HFA, Trelegy 100 neb Duoneb, Astelin,  ------8 mo f/u for COPD. States she needs a flu vaccine.  Intermittent pleuritic pains under R lateral ribs x several weeks. No trauma or rash. She blames her scoliosis.  Little cough or wheeze now.  ROS-see HPI  + = positive Constitutional:   No-   weight loss, night sweats, fevers, chills, fatigue, lassitude. HEENT:   No-  headaches, difficulty swallowing, tooth/dental problems, sore throat,       No-  sneezing, itching, ear ache, nasal congestion, post nasal drip,  CV:+chest pain, orthopnea, PND, swelling in lower extremities, anasarca,                                                   Dizziness,+ palpitations Resp: +shortness of breath with exertion or at rest.               productive cough,   non-productive cough,  No- coughing up of blood.                 change in color of mucus.   wheezing.   Skin: No-   rash or lesions. GI:  +heartburn, indigestion, No-abdominal pain, nausea, vomiting,  GU: . MS:  No-   joint pain or swelling.   + back pain Neuro-     nothing unusual Psych:  No- change in  mood or affect. +depression or anxiety.  + memory loss.    Objective:  OBJ- Physical Exam    Arrival O2 sat 93% on POC General- Alert, Oriented, Affect-appropriate, Distress- none acute,  Skin- rash-none, lesions- none, excoriation- none  Lymphadenopathy- none Head- atraumatic            Eyes- Gross vision intact, PERRLA, conjunctivae and secretions clear            Ears- Hearing, canals-normal            Nose- Clear, no-Septal dev, mucus, polyps, erosion, perforation             Throat- Mallampati IV , mucosa -no thrush , drainage- none, tonsils- atrophic,                                      stridor-none,  + missing teeth Neck- flexible , trachea midline, no stridor , thyroid nl, carotid no bruit Chest - symmetrical excursion , unlabored           Heart/CV- RRR , no murmur , no gallop  , no rub,  nl s1 s2                           - JVD- none , edema- none, stasis changes- none, varices- none           Lung-  + clear, unlabored, wheeze-none, cough- none, dullness-none, rub- none           Chest wall- +L pacemaker Abd-  Br/ Gen/ Rectal- Not done, not indicated Extrem- cyanosis- none, clubbing, none, atrophy- none, strength- , + wheelchair Neuro- grossly intact to observation

## 2021-02-08 NOTE — Assessment & Plan Note (Addendum)
Clinically stable. Discussed meds. Plan- continue present meds, flu vax

## 2021-02-08 NOTE — Patient Instructions (Addendum)
Order- CXR    dx COPD mixed type, R lateral chest wall pain  Order- flu vax senior  Please call if we can help

## 2021-02-08 NOTE — Assessment & Plan Note (Signed)
Suspect she is right- nerve rot irritation from scoliotic spine Plan- CXR/ rib details. Treat symptomatically.

## 2021-02-11 NOTE — Progress Notes (Signed)
Spoke with pt and she voices understanding. Pt did not have any further questions.

## 2021-02-22 ENCOUNTER — Telehealth: Payer: Self-pay | Admitting: Internal Medicine

## 2021-02-22 ENCOUNTER — Encounter: Payer: Self-pay | Admitting: Family Medicine

## 2021-02-22 ENCOUNTER — Other Ambulatory Visit: Payer: Self-pay

## 2021-02-22 ENCOUNTER — Ambulatory Visit (INDEPENDENT_AMBULATORY_CARE_PROVIDER_SITE_OTHER): Payer: Medicare Other | Admitting: Family Medicine

## 2021-02-22 VITALS — BP 158/94 | Temp 98.0°F | Ht 63.0 in | Wt 155.4 lb

## 2021-02-22 DIAGNOSIS — E782 Mixed hyperlipidemia: Secondary | ICD-10-CM | POA: Diagnosis not present

## 2021-02-22 DIAGNOSIS — I10 Essential (primary) hypertension: Secondary | ICD-10-CM | POA: Diagnosis not present

## 2021-02-22 DIAGNOSIS — I442 Atrioventricular block, complete: Secondary | ICD-10-CM | POA: Diagnosis not present

## 2021-02-22 DIAGNOSIS — E1165 Type 2 diabetes mellitus with hyperglycemia: Secondary | ICD-10-CM | POA: Diagnosis not present

## 2021-02-22 DIAGNOSIS — J449 Chronic obstructive pulmonary disease, unspecified: Secondary | ICD-10-CM

## 2021-02-22 NOTE — Progress Notes (Signed)
Provider:  Alain Honey, MD  Careteam: Patient Care Team: Wardell Honour, MD as PCP - General (Family Medicine) Raynelle Bring, MD as Consulting Physician (Urology) Evans Lance, MD as Consulting Physician (Cardiology) Deneise Lever, MD as Consulting Physician (Pulmonary Disease) [facility] [name], MD as Consulting Physician (Endocrinology) Newt Minion, MD as Consulting Physician (Orthopedic Surgery) Raynelle Bring, MD as Consulting Physician (Urology)  PLACE OF SERVICE:  [place]  Advanced Directive information    Allergies  Allergen Reactions   Chlordiazepoxide-Clidinium Other (See Comments)    Sleepy,weak   5-Alpha Reductase Inhibitors    Biaxin [Clarithromycin] Other (See Comments)    Foul taste, abd pain, diarrhea   Tramadol Other (See Comments)    Caused tremors   Codeine Other (See Comments)    REACTION: gi upset- only in high doses per pt.  Can tolerate cough syrup with codeine.     Chief Complaint  Patient presents with   Medical Management of Chronic Issues    Patient presents today for a 2 month follow-up.   Quality Metric Gaps    DEXA scan, eye exam, urine microalbumin, eye exam, TDAP, zoster vaccines     HPI: Patient is a 85 y.o. female patient is here for routine follow-up of chronic problems including COPD and uncontrolled type 2 diabetes.  Patient eats pretty much as she wants to.  She still continues to take insulin but even endocrinologist could not control her sugars so have not been aggressive in trying to regulate that.  She saw a pulmonologist recently.  No changes were made in her inhalers she continues to wear oxygen 24/7.  She tells me that she would be very short of breath when trying to walk without oxygen.  Review of Systems:  Review of Systems  Constitutional: Negative.   HENT: Negative.    Respiratory:  Positive for shortness of breath.   Cardiovascular: Negative.   Neurological: Negative.    Psychiatric/Behavioral:  Positive for memory loss. The patient is nervous/anxious.   All other systems reviewed and are negative.  Past Medical History:  Diagnosis Date   Acute and chronic respiratory failure with hypoxia (HCC)    Allergic rhinitis, cause unspecified    Sinus CT Rec 12-23-2009   Anxiety disorder, unspecified    Arthritis    Atrioventricular block, complete (HCC)    Benign paroxysmal positional vertigo 08/09/2012   Chronic airway obstruction, not elsewhere classified    HFA 75-90% after coaching 12-23-2009   Chronic obstructive pulmonary disease, unspecified ([place])    Chronic rhinitis    Cognitive communication deficit    Complication of anesthesia    takes along time to wake up   Constipation, unspecified    COPD mixed type ([place])    Diabetes mellitus    Diarrhea    Diverticulosis    Dizziness and giddiness    Elevated troponin    Esophageal reflux    Esophageal stricture    Essential (primary) hypertension    Gastro-esophageal reflux disease without esophagitis    GERD (gastroesophageal reflux disease)    Glaucoma    Hiatal hernia    History of urinary tract infection    Hyperlipidemia, unspecified    Hypertension    Irritable bowel syndrome    Leukocytosis    Metabolic encephalopathy    Muscle weakness (generalized)    Neuropathy in diabetes ([place]) 08/09/2012   Obesity, unspecified    Oth speech/lang deficits following oth cerebvasc disease  Other abnormalities of gait and mobility    Other chronic nonalcoholic liver disease    Other forms of scoliosis, site unspecified    Other specified diseases of liver    Oxygen deficiency    Presence of permanent cardiac pacemaker 01/14/2020   Sciatica    Scoliosis    Shingles 2010   Shortness of breath    Spinal stenosis, lumbar region, without neurogenic claudication    Steatohepatitis    Thrombocytopenia, unspecified (HCC)    Type 2 diabetes mellitus with diabetic polyneuropathy ([place])    Type 2 diabetes  mellitus with other diabetic neurological complication ([place])    Unsteadiness on feet    Past Surgical History:  Procedure Laterality Date   APPENDECTOMY     CARPAL TUNNEL RELEASE     right hand   CHOLECYSTECTOMY OPEN  1978   COLONOSCOPY  07-2001   mild diverticulosis   ESOPHAGOGASTRODUODENOSCOPY  7169,67-89   H Hernia,es.stricture s/p dil 29F   INTRAOCULAR LENS INSERTION Bilateral    LIVER BIOPSY  07-8099   PACEMAKER IMPLANT N/A 01/14/2020   Procedure: PACEMAKER IMPLANT;  Surgeon: Evans Lance, MD;  Location: [facility];  Service: Cardiovascular;  Laterality: N/A;   PARATHYROID EXPLORATION     TONSILLECTOMY AND ADENOIDECTOMY     TOTAL ABDOMINAL HYSTERECTOMY     ULNAR NERVE TRANSPOSITION  12/07/2011   Procedure: ULNAR NERVE DECOMPRESSION/TRANSPOSITION;this was cancelled-not done  Surgeon: Cammie Sickle., MD;  Location: [facility];  Service: Orthopedics;  Laterality: Right;  right ulnar nerve in situ decompression   ULNAR TUNNEL RELEASE  03/07/2012   Procedure: CUBITAL TUNNEL RELEASE;  Surgeon: Roseanne Kaufman, MD;  Location: [facility];  Service: Orthopedics;  Laterality: Right;  ulnar nerve release at the elbow     Social History:   reports that she quit smoking about 32 years ago. Her smoking use included cigarettes. She has never used smokeless tobacco. She reports that she does not drink alcohol and does not use drugs.  Family History  Problem Relation Age of Onset   Heart disease Father    Lung cancer Mother        small cell;Byssinosis   Lung cancer Sister    Liver cancer Sister        ? mets from another area of the body   Diabetes Other        grandmother   Stroke Maternal Grandfather     Medications: Patient's Medications  New Prescriptions   No medications on file  Previous Medications   ACETAMINOPHEN (TYLENOL) 325 MG TABLET    Take 2 tablets (650 mg total) by mouth every 6 (six) hours as needed for mild pain (or Fever  >/= 101).   ALBUTEROL (VENTOLIN HFA) 108 (90 BASE) MCG/ACT INHALER    INHALE 2 PUFFS INTO THE LUNGS EVERY 6 HOURS AS NEEDED FOR WHEEZING OR SHORTNESS OF BREATH.   ALPRAZOLAM (XANAX) 0.5 MG TABLET    Take 0.5 tablets (0.25 mg total) by mouth at bedtime as needed for up to 30 doses for sleep.   ASPIRIN EC 81 MG TABLET    Take 1 tablet (81 mg total) by mouth daily. Swallow whole.   CHOLESTYRAMINE (QUESTRAN) 4 G PACKET    Take 1 packet (4 g total) by mouth 2 (two) times daily as needed. diarrhea   FLUTICASONE-UMECLIDIN-VILANT (TRELEGY ELLIPTA) 100-62.5-25 MCG/INH AEPB    Inhale 1 puff into the lungs daily.   FUROSEMIDE (LASIX) 20 MG  TABLET    Take 1 tablet (20 mg total) by mouth daily.   INSULIN ASPART (NOVOLOG FLEXPEN) 100 UNIT/ML FLEXPEN    INJECT 12-14 UNITS UNDER THE SKIN AT BREAKFAST, 14-16 UNITS AT LUNCH, AND 12-16 UNITS AT DINNER.   INSULIN DEGLUDEC (TRESIBA FLEXTOUCH) 200 UNIT/ML FLEXTOUCH PEN    Inject 36 Units into the skin at bedtime.   IPRATROPIUM-ALBUTEROL (DUONEB) 0.5-2.5 (3) MG/3ML SOLN    1 vial in neb every 8 hours and as needed   MAGIC MOUTHWASH SOLN    Swish with 2 teaspoonfuls by mouth as needed   METOPROLOL SUCCINATE (TOPROL-XL) 50 MG 24 HR TABLET    Take 50 mg by mouth 2 (two) times daily. Take with or immediately following a meal.   MOMETASONE (ELOCON) 0.1 % CREAM    Apply small amount bid to affected areas   NYSTATIN (MYCOSTATIN) 100000 UNIT/ML SUSPENSION       NYSTATIN CREAM (MYCOSTATIN)    Apply 1 application topically 2 (two) times daily.   OMEPRAZOLE (PRILOSEC) 20 MG CAPSULE    TAKE (1) CAPSULE DAILY.   PRAVASTATIN (PRAVACHOL) 20 MG TABLET    Take 1 tablet (20 mg total) by mouth daily.   SODIUM CHLORIDE HYPERTONIC 3 % NEBULIZER SOLUTION    1 ampule neb every 6 hours if needed to clear mucus  Modified Medications   No medications on file  Discontinued Medications   No medications on file    Physical Exam:  Vitals:   02/22/21 1003  BP: (!) 158/94  Temp: 98 F (36.7  C)  SpO2: 93%  Weight: 155 lb 6.4 oz (70.5 kg)  Height: 5' 3"  (1.6 m)   Body mass index is 27.53 kg/m. Wt Readings from Last 3 Encounters:  02/22/21 155 lb 6.4 oz (70.5 kg)  02/08/21 156 lb (70.8 kg)  12/22/20 156 lb (70.8 kg)    Physical Exam Vitals and nursing note reviewed.  Constitutional:      Appearance: Normal appearance.  Cardiovascular:     Rate and Rhythm: Normal rate and regular rhythm.  Pulmonary:     Effort: Pulmonary effort is normal.     Breath sounds: Normal breath sounds.  Neurological:     General: No focal deficit present.     Mental Status: She is alert and oriented to person, place, and time.     Comments: Patient reports memory problems but in talking to her memory really is intact  Psychiatric:        Mood and Affect: Mood normal.        Behavior: Behavior normal.    Labs reviewed: Basic Metabolic Panel: Recent Labs    06/07/20 1220 08/26/20 1023  NA 140 137  K 4.2 4.3  CL 98 94*  CO2 29 26  GLUCOSE 68 155*  BUN 9 13  CREATININE 0.63 0.74  CALCIUM 9.9 9.6   Liver Function Tests: Recent Labs    06/07/20 1220  AST 17  ALT 11  BILITOT 0.4  PROT 7.8   No results for input(s): LIPASE, AMYLASE in the last 8760 hours. No results for input(s): AMMONIA in the last 8760 hours. CBC: Recent Labs    06/07/20 1220 11/25/20 1224  WBC 8.6 8.1  NEUTROABS 3,844 4,236  HGB 14.9 15.2  HCT 46.8* 47.8*  MCV 84.2 85.8  PLT 198 174   Lipid Panel: Recent Labs    06/07/20 1220  CHOL 178  HDL 55  LDLCALC 101*  TRIG 122  CHOLHDL 3.2  TSH: No results for input(s): TSH in the last 8760 hours. A1C: Lab Results  Component Value Date   HGBA1C 11.0 (H) 10/12/2020     Assessment/Plan  1. Uncontrolled type 2 diabetes mellitus with hyperglycemia (HCC) Will check A1c today but did not expect it to be very good given her past history - Hemoglobin A1c  2. Complete heart block ([place]) Had pacer installed last December.  Things are going  well from that point of view  3. COPD mixed type ([place]) She uses Ventolin but tells me she has not taking the maintenance inhalers  4. Mixed hyperlipidemia Lipids were not quite at goal last January need to reassess at next visit  5. Primary hypertension Blood pressure today is 158/94.  She takes metoprolol 50 mg twice daily   Alain Honey, MD [place] [phone]

## 2021-02-22 NOTE — Telephone Encounter (Signed)
She has Trelegy and nebulizer with Duoneb on her list. Is she using them?

## 2021-02-22 NOTE — Telephone Encounter (Signed)
Spoke with the pt  She states her breathing seems worse since ov 02/08/21  She states that she is using only her albuterol inhaler 3-4 x per day  She is wheezing and says her nose is still running all the time  She has checked her sats on RA and they are occ in the mid to low 80's  Her sats are always above 90% when waring her o2 so I encouraged her to use this all the time as directed  She is asking if there is anything else she can try  She likes stiolto but it's too expensive  She is okay with call back with response 02/23/21  She was advised to seek emergent care sooner if needed  Allergies  Allergen Reactions   Chlordiazepoxide-Clidinium Other (See Comments)    Sleepy,weak   5-Alpha Reductase Inhibitors    Biaxin [Clarithromycin] Other (See Comments)    Foul taste, abd pain, diarrhea   Tramadol Other (See Comments)    Caused tremors   Codeine Other (See Comments)    REACTION: gi upset- only in high doses per pt.  Can tolerate cough syrup with codeine.

## 2021-02-23 LAB — HEMOGLOBIN A1C
Hgb A1c MFr Bld: 11.6 % of total Hgb — ABNORMAL HIGH (ref <5.7)
Mean Plasma Glucose: 286 mg/dL
eAG (mmol/L): 15.9 mmol/L

## 2021-02-23 NOTE — Telephone Encounter (Signed)
Spoke with pt and encouraged pt to use nebulizer 1 -2 times per day. Pt stated she would start this afternoon and let us know if she feels like it is helping. Nothing further needed at this time.

## 2021-02-23 NOTE — Telephone Encounter (Signed)
At the least, suggest she try using her nebulizer machine once or twice daily. That has the same drug effects as Stiolto. See if it helps her feel better controlled.

## 2021-02-23 NOTE — Telephone Encounter (Signed)
Spoke with pt who stated that she is not using Trelegy or Duonebs. Pt states that she feels like the Trelegy is not doing anything for her and the Duonebs are to much work and require to much effort to do every 8 hours. RN reviewed administration directions for both medications with pt.  Pt states rescue albuterol is much easier for her to use and she states it does help somewhat. Pt likes Stiolto but [employer] will not pay for it. RN encouraged pt to use Trelegy and Duonebs. Dr. Annamaria Boots please advise.

## 2021-02-28 ENCOUNTER — Other Ambulatory Visit: Payer: Self-pay | Admitting: *Deleted

## 2021-02-28 DIAGNOSIS — E782 Mixed hyperlipidemia: Secondary | ICD-10-CM

## 2021-02-28 DIAGNOSIS — E1142 Type 2 diabetes mellitus with diabetic polyneuropathy: Secondary | ICD-10-CM

## 2021-02-28 MED ORDER — PRAVASTATIN SODIUM 20 MG PO TABS: 20.0000 mg | ORAL_TABLET | Freq: Every day | ORAL | 1 refills | Status: AC

## 2021-02-28 NOTE — Telephone Encounter (Signed)
Pharmacy requested refill

## 2021-03-02 ENCOUNTER — Telehealth: Payer: Self-pay | Admitting: *Deleted

## 2021-03-02 ENCOUNTER — Ambulatory Visit: Payer: Medicare Other | Admitting: Family

## 2021-03-02 ENCOUNTER — Other Ambulatory Visit: Payer: Self-pay | Admitting: Nurse Practitioner

## 2021-03-02 DIAGNOSIS — F419 Anxiety disorder, unspecified: Secondary | ICD-10-CM

## 2021-03-02 DIAGNOSIS — R197 Diarrhea, unspecified: Secondary | ICD-10-CM

## 2021-03-02 NOTE — Telephone Encounter (Signed)
Patient stated that the bleeding is not worse and has gotten better. Stated that she will go to the hospital if it worsens.  Stated that she wants to keep her appointment here for tomorrow.

## 2021-03-02 NOTE — Telephone Encounter (Signed)
Patient called and stated that she was unable to come to the appointment with Dinah this morning at 11 because she has Diarrhea and could not get off the toilet.   Stated that she saw blood in her stool last night when she wiped and has had blood ever since when she wipes.   Stated that years ago she went to GI at [facility] and saw Amy and they diagnosed her with IBS. Patient states that is what she thinks she is having now.   Stated that it has been so long since she saw the GI Dr. Parks Ranger they require a referral now to schedule an appointment.  Stated that she doesn't want to go to the hospital but if it worsens or anything changes before her rescheduled appointment tomorrow with Webb Silversmith, she will go.

## 2021-03-02 NOTE — Addendum Note (Signed)
Addended byMarlowe Sax C on: 03/02/2021 01:01 PM   Modules accepted: Orders

## 2021-03-02 NOTE — Telephone Encounter (Signed)
If still bleeding I recommend evaluation in ED not sure when you can be seen by Gastroenterology.Need to control the bleeding. Will send order for GI in the meantime.

## 2021-03-03 ENCOUNTER — Ambulatory Visit (INDEPENDENT_AMBULATORY_CARE_PROVIDER_SITE_OTHER): Payer: Medicare Other | Admitting: Family

## 2021-03-03 ENCOUNTER — Other Ambulatory Visit: Payer: Self-pay

## 2021-03-03 ENCOUNTER — Other Ambulatory Visit: Payer: Self-pay | Admitting: *Deleted

## 2021-03-03 ENCOUNTER — Encounter: Payer: Self-pay | Admitting: Family

## 2021-03-03 VITALS — BP 140/92 | HR 118 | Temp 96.9°F | Resp 16 | Ht 63.0 in | Wt 153.8 lb

## 2021-03-03 DIAGNOSIS — B372 Candidiasis of skin and nail: Secondary | ICD-10-CM | POA: Diagnosis not present

## 2021-03-03 DIAGNOSIS — K625 Hemorrhage of anus and rectum: Secondary | ICD-10-CM

## 2021-03-03 DIAGNOSIS — K582 Mixed irritable bowel syndrome: Secondary | ICD-10-CM

## 2021-03-03 DIAGNOSIS — F419 Anxiety disorder, unspecified: Secondary | ICD-10-CM

## 2021-03-03 LAB — CBC WITH DIFFERENTIAL/PLATELET
Absolute Monocytes: 450 cells/uL (ref 200–950)
Basophils Absolute: 72 cells/uL (ref 0–200)
Basophils Relative: 0.8 %
Eosinophils Absolute: 270 cells/uL (ref 15–500)
Eosinophils Relative: 3 %
HCT: 47.8 % — ABNORMAL HIGH (ref 35.0–45.0)
Hemoglobin: 15.2 g/dL (ref 11.7–15.5)
Lymphs Abs: 3375 cells/uL (ref 850–3900)
MCH: 27.3 pg (ref 27.0–33.0)
MCHC: 31.8 g/dL — ABNORMAL LOW (ref 32.0–36.0)
MCV: 86 fL (ref 80.0–100.0)
MPV: 11.4 fL (ref 7.5–12.5)
Monocytes Relative: 5 %
Neutro Abs: 4833 cells/uL (ref 1500–7800)
Neutrophils Relative %: 53.7 %
Platelets: 161 10*3/uL (ref 140–400)
RBC: 5.56 10*6/uL — ABNORMAL HIGH (ref 3.80–5.10)
RDW: 13.7 % (ref 11.0–15.0)
Total Lymphocyte: 37.5 %
WBC: 9 10*3/uL (ref 3.8–10.8)

## 2021-03-03 MED ORDER — ALPRAZOLAM 0.5 MG PO TABS: 0.2500 mg | ORAL_TABLET | Freq: Every evening | ORAL | 0 refills | Status: DC | PRN

## 2021-03-03 MED ORDER — NYSTATIN 100000 UNIT/GM EX CREA: 1.0000 "application " | TOPICAL_CREAM | Freq: Two times a day (BID) | CUTANEOUS | 0 refills | Status: DC

## 2021-03-03 NOTE — Telephone Encounter (Signed)
Pharmacy called requesting refill on medication.  Their LR: 07/14/2020 No contract on File.  Pended Rx and sent to [facility] for approval.

## 2021-03-03 NOTE — Progress Notes (Signed)
Provider: Tahjae Durr FNP-C  Wardell Honour, MD  Patient Care Team: Wardell Honour, MD as PCP - General (Family Medicine) Raynelle Bring, MD as Consulting Physician (Urology) Evans Lance, MD as Consulting Physician (Cardiology) Deneise Lever, MD as Consulting Physician (Pulmonary Disease) [facility] [name], MD as Consulting Physician (Endocrinology) Newt Minion, MD as Consulting Physician (Orthopedic Surgery) Raynelle Bring, MD as Consulting Physician (Urology)  Extended Emergency Contact Information Primary Emergency Contact: [facility] Phone: [phone] Relation: Friend  Code Status:  DNR Goals of care: Advanced Directive information Advanced Directives 03/03/2021  Does Patient Have a Medical Advance Directive? No  Type of Advance Directive -  Does patient want to make changes to medical advance directive? -  Copy of [place] in Chart? -  Would patient like information on creating a medical advance directive? No - Patient declined  Pre-existing out of facility DNR order (yellow form or pink MOST form) -     Chief Complaint  Patient presents with   Acute Visit    Patient complains of rectal bleeding since last night.     HPI:  Pt is a 85 y.o. female seen today for an acute visit for evaluation of rectal bleeding since last night.States had constipation when she used the toilet her stool had bright red blood.Had diarrhea this morning.denies any abdominal pain,distension,nausea or vomiting.she was getting ready to come this morning had had diarrhea all over her clothes. Has a significant medical history of Irritable bowel syndrome   Past Medical History:  Diagnosis Date   Acute and chronic respiratory failure with hypoxia (HCC)    Allergic rhinitis, cause unspecified    Sinus CT Rec 12-23-2009   Anxiety disorder, unspecified    Arthritis    Atrioventricular block, complete (HCC)    Benign  paroxysmal positional vertigo 08/09/2012   Chronic airway obstruction, not elsewhere classified    HFA 75-90% after coaching 12-23-2009   Chronic obstructive pulmonary disease, unspecified ([place])    Chronic rhinitis    Cognitive communication deficit    Complication of anesthesia    takes along time to wake up   Constipation, unspecified    COPD mixed type ([place])    Diabetes mellitus    Diarrhea    Diverticulosis    Dizziness and giddiness    Elevated troponin    Esophageal reflux    Esophageal stricture    Essential (primary) hypertension    Gastro-esophageal reflux disease without esophagitis    GERD (gastroesophageal reflux disease)    Glaucoma    Hiatal hernia    History of urinary tract infection    Hyperlipidemia, unspecified    Hypertension    Irritable bowel syndrome    Leukocytosis    Metabolic encephalopathy    Muscle weakness (generalized)    Neuropathy in diabetes ([place]) 08/09/2012   Obesity, unspecified    Oth speech/lang deficits following oth cerebvasc disease    Other abnormalities of gait and mobility    Other chronic nonalcoholic liver disease    Other forms of scoliosis, site unspecified    Other specified diseases of liver    Oxygen deficiency    Presence of permanent cardiac pacemaker 01/14/2020   Sciatica    Scoliosis    Shingles 2010   Shortness of breath    Spinal stenosis, lumbar region, without neurogenic claudication    Steatohepatitis    Thrombocytopenia, unspecified (HCC)    Type 2 diabetes mellitus with diabetic  polyneuropathy ([place])    Type 2 diabetes mellitus with other diabetic neurological complication (HCC)    Unsteadiness on feet    Past Surgical History:  Procedure Laterality Date   APPENDECTOMY     CARPAL TUNNEL RELEASE     right hand   CHOLECYSTECTOMY OPEN  1978   COLONOSCOPY  07-2001   mild diverticulosis   ESOPHAGOGASTRODUODENOSCOPY  2993,71-69   H Hernia,es.stricture s/p dil 67F   INTRAOCULAR LENS INSERTION Bilateral    LIVER  BIOPSY  [id number]   PACEMAKER IMPLANT N/A 01/14/2020   Procedure: PACEMAKER IMPLANT;  Surgeon: Evans Lance, MD;  Location: [facility];  Service: Cardiovascular;  Laterality: N/A;   PARATHYROID EXPLORATION     TONSILLECTOMY AND ADENOIDECTOMY     TOTAL ABDOMINAL HYSTERECTOMY     ULNAR NERVE TRANSPOSITION  12/07/2011   Procedure: ULNAR NERVE DECOMPRESSION/TRANSPOSITION;this was cancelled-not done  Surgeon: Cammie Sickle., MD;  Location: [place];  Service: Orthopedics;  Laterality: Right;  right ulnar nerve in situ decompression   ULNAR TUNNEL RELEASE  03/07/2012   Procedure: CUBITAL TUNNEL RELEASE;  Surgeon: Roseanne Kaufman, MD;  Location: [place];  Service: Orthopedics;  Laterality: Right;  ulnar nerve release at the elbow      Allergies  Allergen Reactions   Chlordiazepoxide-Clidinium Other (See Comments)    Sleepy,weak   5-Alpha Reductase Inhibitors    Biaxin [Clarithromycin] Other (See Comments)    Foul taste, abd pain, diarrhea   Tramadol Other (See Comments)    Caused tremors   Codeine Other (See Comments)    REACTION: gi upset- only in high doses per pt.  Can tolerate cough syrup with codeine.     Outpatient Encounter Medications as of 03/03/2021  Medication Sig   acetaminophen (TYLENOL) 325 MG tablet Take 2 tablets (650 mg total) by mouth every 6 (six) hours as needed for mild pain (or Fever >/= 101).   albuterol (VENTOLIN HFA) 108 (90 Base) MCG/ACT inhaler INHALE 2 PUFFS INTO THE LUNGS EVERY 6 HOURS AS NEEDED FOR WHEEZING OR SHORTNESS OF BREATH.   ALPRAZolam (XANAX) 0.5 MG tablet Take 0.5 tablets (0.25 mg total) by mouth at bedtime as needed for up to 30 doses for sleep.   aspirin EC 81 MG tablet Take 1 tablet (81 mg total) by mouth daily. Swallow whole.   cholestyramine (QUESTRAN) 4 g packet Take 1 packet (4 g total) by mouth 2 (two) times daily as needed. diarrhea   Fluticasone-Umeclidin-Vilant (TRELEGY ELLIPTA) 100-62.5-25  MCG/INH AEPB Inhale 1 puff into the lungs daily.   furosemide (LASIX) 20 MG tablet Take 1 tablet (20 mg total) by mouth daily.   insulin aspart (NOVOLOG FLEXPEN) 100 UNIT/ML FlexPen INJECT 12-14 UNITS UNDER THE SKIN AT BREAKFAST, 14-16 UNITS AT LUNCH, AND 12-16 UNITS AT DINNER.   insulin degludec (TRESIBA FLEXTOUCH) 200 UNIT/ML FlexTouch Pen Inject 36 Units into the skin at bedtime.   ipratropium-albuterol (DUONEB) 0.5-2.5 (3) MG/3ML SOLN 1 vial in neb every 8 hours and as needed   magic mouthwash SOLN Swish with 2 teaspoonfuls by mouth as needed   metoprolol succinate (TOPROL-XL) 50 MG 24 hr tablet Take 50 mg by mouth 2 (two) times daily. Take with or immediately following a meal.   mometasone (ELOCON) 0.1 % cream Apply small amount bid to affected areas   nystatin (MYCOSTATIN) 100000 UNIT/ML suspension    nystatin cream (MYCOSTATIN) Apply 1 application topically 2 (two) times daily.   omeprazole (PRILOSEC) 20 MG  capsule TAKE (1) CAPSULE DAILY.   pravastatin (PRAVACHOL) 20 MG tablet Take 1 tablet (20 mg total) by mouth daily.   sodium chloride HYPERTONIC 3 % nebulizer solution 1 ampule neb every 6 hours if needed to clear mucus   No facility-administered encounter medications on file as of 03/03/2021.    Review of Systems  Constitutional:  Negative for appetite change, chills, fatigue and fever.  Respiratory:  Negative for cough, chest tightness, shortness of breath and wheezing.   Gastrointestinal:  Positive for anal bleeding, blood in stool, constipation and diarrhea. Negative for abdominal distention, abdominal pain, nausea, rectal pain and vomiting.  Musculoskeletal:  Positive for gait problem. Negative for back pain.  Skin:  Negative for color change, pallor and rash.  Neurological:  Negative for syncope, weakness, light-headedness and headaches.       Chronic dizziness has not worsen   Hematological:  Does not bruise/bleed easily.  Psychiatric/Behavioral:  Negative for agitation,  confusion and sleep disturbance.    Immunization History  Administered Date(s) Administered   Fluad Quad(high Dose 65+) 01/30/2019, 03/01/2020, 02/08/2021   Influenza Split 01/07/2011, 02/22/2012   Influenza Whole 05/09/2007, 02/05/2010, 01/22/2017   Influenza, High Dose Seasonal PF 02/06/2018   Influenza,inj,Quad PF,6+ Mos 01/21/2013, 03/18/2014, 01/08/2015, 01/13/2016   Janssen (J&J) SARS-COV-2 Vaccination 01/23/2020   Moderna SARS-COV2 Booster Vaccination 05/26/2020, 11/15/2020   Pneumococcal Conjugate-13 11/24/2013   Pneumococcal Polysaccharide-23 04/23/2012   Pertinent  Health Maintenance Due  Topic Date Due   DEXA SCAN  Never done   URINE MICROALBUMIN  05/11/2018   OPHTHALMOLOGY EXAM  12/28/2018   HEMOGLOBIN A1C  08/23/2021   FOOT EXAM  09/01/2021   INFLUENZA VACCINE  Completed   Fall Risk 08/18/2020 11/16/2020 11/25/2020 02/22/2021 03/03/2021  Falls in the past year? 1 0 0 0 0  Was there an injury with Fall? 1 - 0 0 0  Fall Risk Category Calculator 3 - 0 0 0  Fall Risk Category High - Low Low Low  Patient Fall Risk Level High fall risk - Low fall risk Low fall risk Low fall risk  Patient at Risk for Falls Due to - - No Fall Risks History of fall(s) No Fall Risks  Fall risk Follow up - Falls evaluation completed Falls evaluation completed Falls evaluation completed;Education provided;Falls prevention discussed Falls evaluation completed   Functional Status Survey:    Vitals:   03/03/21 1052  BP: (!) 140/92  Pulse: (!) 118  Resp: 16  Temp: (!) 96.9 F (36.1 C)  SpO2: 91%  Weight: 153 lb 12.8 oz (69.8 kg)  Height: 5' 3"  (1.6 m)   Body mass index is 27.24 kg/m. Physical Exam Vitals reviewed. Exam conducted with a chaperone present Dellis Filbert).  Constitutional:      General: She is not in acute distress.    Appearance: Normal appearance. She is overweight. She is not ill-appearing or diaphoretic.  HENT:     Mouth/Throat:     Mouth: Mucous membranes  are moist.     Pharynx: Oropharynx is clear. No oropharyngeal exudate or posterior oropharyngeal erythema.  Eyes:     General: No scleral icterus.       Right eye: No discharge.        Left eye: No discharge.     Conjunctiva/sclera: Conjunctivae normal.     Pupils: Pupils are equal, round, and reactive to light.  Cardiovascular:     Rate and Rhythm: Normal rate and regular rhythm.     Pulses: Normal pulses.  Heart sounds: Normal heart sounds. No murmur heard.   No friction rub. No gallop.  Pulmonary:     Effort: Pulmonary effort is normal. No respiratory distress.     Breath sounds: Normal breath sounds. No wheezing, rhonchi or rales.  Chest:     Chest wall: No tenderness.  Abdominal:     General: Bowel sounds are normal. There is no distension.     Palpations: Abdomen is soft. There is no mass.     Tenderness: There is no abdominal tenderness. There is no right CVA tenderness, left CVA tenderness, guarding or rebound.  Genitourinary:    Exam position: Knee-chest position.     Rectum: Guaiac result negative. No mass, anal fissure, external hemorrhoid or internal hemorrhoid. Normal anal tone.  Skin:    General: Skin is warm and dry.     Coloration: Skin is not pale.     Findings: No bruising or rash.     Comments: Left gluteal and anal area skin beefy redness noted   Neurological:     Mental Status: She is alert and oriented to person, place, and time.     Motor: No weakness.     Gait: Gait abnormal.  Psychiatric:        Mood and Affect: Mood normal.        Speech: Speech normal.        Behavior: Behavior normal.        Thought Content: Thought content normal.        Judgment: Judgment normal.    Labs reviewed: Recent Labs    06/07/20 1220 08/26/20 1023  NA 140 137  K 4.2 4.3  CL 98 94*  CO2 29 26  GLUCOSE 68 155*  BUN 9 13  CREATININE 0.63 0.74  CALCIUM 9.9 9.6   Recent Labs    06/07/20 1220  AST 17  ALT 11  BILITOT 0.4  PROT 7.8   Recent Labs     06/07/20 1220 11/25/20 1224  WBC 8.6 8.1  NEUTROABS 3,844 4,236  HGB 14.9 15.2  HCT 46.8* 47.8*  MCV 84.2 85.8  PLT 198 174   Lab Results  Component Value Date   TSH 1.970 01/13/2020   Lab Results  Component Value Date   HGBA1C 11.6 (H) 02/22/2021   Lab Results  Component Value Date   CHOL 178 06/07/2020   HDL 55 06/07/2020   LDLCALC 101 (H) 06/07/2020   LDLDIRECT 89.0 10/14/2014   TRIG 122 06/07/2020   CHOLHDL 3.2 06/07/2020    Significant Diagnostic Results in last 30 days:  DG Chest 2 View  Result Date: 02/09/2021 CLINICAL DATA:  85 year old female with history of left lateral chest wall pain. History of COPD. EXAM: CHEST - 2 VIEW COMPARISON:  Chest x-ray 01/19/2020. FINDINGS: Lung volumes are normal. No consolidative airspace disease. No pleural effusions. No pneumothorax. No pulmonary nodule or mass noted. Pulmonary vasculature and the cardiomediastinal silhouette are within normal limits. Atherosclerosis in the thoracic aorta. Left-sided pacemaker device in place with lead tips projecting over the expected location of the right atrium and right ventricle. IMPRESSION: 1. No radiographic evidence of acute cardiopulmonary disease. 2. Aortic atherosclerosis. Electronically Signed   By: Vinnie Langton M.D.   On: 02/09/2021 13:02    Assessment/Plan  1. Rectal bleeding Chaperone present Leafy Half No bleeding noted during exam Negative rectal exam  Guiacic negative  Suspect bleeding due to constipation given Hx of IBS with both constipation and diarrhea. - will recheck CBC  rule out GI bleed  - advised to notify provider or go to ED if symptoms recurs - CBC with Differential/Platelet  2. Irritable bowel syndrome with both constipation and diarrhea Chronic  Status post constipation yesterday and diarrhea this morning  Negative abdominal exam.  - continue current meds.   3. Candidiasis of skin Left gluteal and anal area skin beefy redness noted  - nystatin  cream (MYCOSTATIN); Apply 1 application topically 2 (two) times daily. Apply to affected red areas on buttock  Dispense: 30 g; Refill: 0  Family/ staff Communication: Reviewed plan of care with patient verbalized understanding  Labs/tests ordered: - CBC with Differential/Platelet  Next Appointment: As needed if symptoms worsen or fail to improve    Sandrea Hughs, NP

## 2021-03-04 ENCOUNTER — Ambulatory Visit (INDEPENDENT_AMBULATORY_CARE_PROVIDER_SITE_OTHER): Payer: Medicare Other | Admitting: Family

## 2021-03-04 ENCOUNTER — Telehealth: Payer: Self-pay

## 2021-03-04 ENCOUNTER — Telehealth: Payer: Self-pay | Admitting: *Deleted

## 2021-03-04 ENCOUNTER — Encounter: Payer: Self-pay | Admitting: Family

## 2021-03-04 DIAGNOSIS — R8290 Unspecified abnormal findings in urine: Secondary | ICD-10-CM

## 2021-03-04 DIAGNOSIS — E1165 Type 2 diabetes mellitus with hyperglycemia: Secondary | ICD-10-CM | POA: Diagnosis not present

## 2021-03-04 MED ORDER — CRANBERRY 475 MG PO CAPS: 475.0000 mg | ORAL_CAPSULE | Freq: Two times a day (BID) | ORAL | 3 refills | Status: AC

## 2021-03-04 NOTE — Telephone Encounter (Signed)
Patient left vm on clinical intake vm stating that she was not given anything for UTI at today's appt. Patient's appt was a telephone appt. And patient is scheduled for in office appt on 10/31 with Marlowe Sax, NP to address both low blood sugars and UTI.

## 2021-03-04 NOTE — Telephone Encounter (Signed)
Patient called and stated that she forgot to tell you yesterday at her appointment that her blood sugar's have been running low.  Stated that she got up at 4:30am yesterday and felt weak and she checked her blood sugar and it was 56. Stated that she drank juice and it went up to 100.   Patient is wondering if she needs a Specialist referral to get her blood sugars under control. Stated that she use to see Dr. Dwyane Dee but they stopped seeing her because she had so many problems.   Requesting referral.  Please Advise.

## 2021-03-04 NOTE — Patient Instructions (Addendum)
Take Novolog 12-14 units if blood sugar > 150   Reduce Tresiba from 36 units to 34 units daily at bedtime.   - Increase water intake   - Take cranberry tablet one by mouth twice daily to prevent urinary tract infection

## 2021-03-04 NOTE — Telephone Encounter (Signed)
Patient called back and stated that she wanted to speak with a provider regarding her blood sugars. Stated that she does not have time to wait for a referral.  Stated that she does not want to come back to the office after being here yesterday.   Also stated that Dinah told her that she had a bladder infection but did not call anything into the pharmacy.   Telephone visit scheduled.

## 2021-03-04 NOTE — Progress Notes (Signed)
This service is provided via telemedicine  No vital signs collected/recorded due to the encounter was a telemedicine visit.   Location of patient (ex: home, work):  Home.  Patient consents to a telephone visit:  Yes  Location of the provider (ex: office, home):  [employer].   Name of any referring provider:  Wardell Honour, MD   Names of all persons participating in the telemedicine service and their role in the encounter:  Patient, Heriberto Antigua, [place], [place], Webb Silversmith, NP.    Time spent on call: 8 minutes spent on the phone with Medical Assistant.    Location:      Place of Service:    Provider: Audel Coakley FNP-C  Wardell Honour, MD  Patient Care Team: Wardell Honour, MD as PCP - General (Family Medicine) Raynelle Bring, MD as Consulting Physician (Urology) Evans Lance, MD as Consulting Physician (Cardiology) Deneise Lever, MD as Consulting Physician (Pulmonary Disease) [facility] [name], MD as Consulting Physician (Endocrinology) Newt Minion, MD as Consulting Physician (Orthopedic Surgery) Raynelle Bring, MD as Consulting Physician (Urology)  Extended Emergency Contact Information Primary Emergency Contact: [facility] Phone: [phone] Relation: Friend  Code Status:  DNR Goals of care: Advanced Directive information Advanced Directives 03/04/2021  Does Patient Have a Medical Advance Directive? No  Type of Advance Directive -  Does patient want to make changes to medical advance directive? -  Copy of [place] in Chart? -  Would patient like information on creating a medical advance directive? No - Patient declined  Pre-existing out of facility DNR order (yellow form or pink MOST form) -     Chief Complaint  Patient presents with   Acute Visit    Patient complains of low blood sugars, and possible UTI.    HPI:  Pt is a 85 y.o. female seen today for an acute  visit for evaluation of low blood sugars.   States Blood sugar was 56 after she left the office.Blood sugar was also low today.prior to this CBG has been in the 200's.drunk orange juice which brought made blood sugars go up.apparently has had blood sugars fluctuating up and down but did not inform provided when she came to the office for evaluation of rectal bleeding that she had.  Gives herself 12 - 14 units Novolog three times with meals.  Uses Tresiba 36 units daily in the morning instead of bedtime.   Latest Hgb A 1 C 11.6 ( 02/22/2021).  States urine is muggy cloudy.feels tired all the time. Has had no fever or chills States has no one to bring her today to the office care giver has already gon for the day. Had beefy skin redness on her left gluteal/rectal area noted yesterday during the visit Nystatin cream was send to pharmacy and both care giver and patient was instructed  to apply twice daily but thought provider was sending antibiotics.I've discussed with her that she will need to bring urine specimen for urine culture.she denies any urine frequency,urgency,dysuria,N/V,abdominal pain or back pain.      Past Medical History:  Diagnosis Date   Acute and chronic respiratory failure with hypoxia (HCC)    Allergic rhinitis, cause unspecified    Sinus CT Rec 12-23-2009   Anxiety disorder, unspecified    Arthritis    Atrioventricular block, complete (HCC)    Benign paroxysmal positional vertigo 08/09/2012   Chronic airway obstruction, not elsewhere classified    HFA 75-90%  after coaching 12-23-2009   Chronic obstructive pulmonary disease, unspecified ([place])    Chronic rhinitis    Cognitive communication deficit    Complication of anesthesia    takes along time to wake up   Constipation, unspecified    COPD mixed type ([place])    Diabetes mellitus    Diarrhea    Diverticulosis    Dizziness and giddiness    Elevated troponin    Esophageal reflux    Esophageal stricture    Essential  (primary) hypertension    Gastro-esophageal reflux disease without esophagitis    GERD (gastroesophageal reflux disease)    Glaucoma    Hiatal hernia    History of urinary tract infection    Hyperlipidemia, unspecified    Hypertension    Irritable bowel syndrome    Leukocytosis    Metabolic encephalopathy    Muscle weakness (generalized)    Neuropathy in diabetes ([place]) 08/09/2012   Obesity, unspecified    Oth speech/lang deficits following oth cerebvasc disease    Other abnormalities of gait and mobility    Other chronic nonalcoholic liver disease    Other forms of scoliosis, site unspecified    Other specified diseases of liver    Oxygen deficiency    Presence of permanent cardiac pacemaker 01/14/2020   Sciatica    Scoliosis    Shingles 2010   Shortness of breath    Spinal stenosis, lumbar region, without neurogenic claudication    Steatohepatitis    Thrombocytopenia, unspecified (HCC)    Type 2 diabetes mellitus with diabetic polyneuropathy ([place])    Type 2 diabetes mellitus with other diabetic neurological complication ([place])    Unsteadiness on feet    Past Surgical History:  Procedure Laterality Date   APPENDECTOMY     CARPAL TUNNEL RELEASE     right hand   CHOLECYSTECTOMY OPEN  1978   COLONOSCOPY  07-2001   mild diverticulosis   ESOPHAGOGASTRODUODENOSCOPY  5277,82-42   H Hernia,es.stricture s/p dil 62F   INTRAOCULAR LENS INSERTION Bilateral    LIVER BIOPSY  07-5359   PACEMAKER IMPLANT N/A 01/14/2020   Procedure: PACEMAKER IMPLANT;  Surgeon: Evans Lance, MD;  Location: [place] [facility];  Service: Cardiovascular;  Laterality: N/A;   PARATHYROID EXPLORATION     TONSILLECTOMY AND ADENOIDECTOMY     TOTAL ABDOMINAL HYSTERECTOMY     ULNAR NERVE TRANSPOSITION  12/07/2011   Procedure: ULNAR NERVE DECOMPRESSION/TRANSPOSITION;this was cancelled-not done  Surgeon: Cammie Sickle., MD;  Location: [place];  Service: Orthopedics;  Laterality: Right;  right  ulnar nerve in situ decompression   ULNAR TUNNEL RELEASE  03/07/2012   Procedure: CUBITAL TUNNEL RELEASE;  Surgeon: Roseanne Kaufman, MD;  Location: [place];  Service: Orthopedics;  Laterality: Right;  ulnar nerve release at the elbow      Allergies  Allergen Reactions   Chlordiazepoxide-Clidinium Other (See Comments)    Sleepy,weak   5-Alpha Reductase Inhibitors    Biaxin [Clarithromycin] Other (See Comments)    Foul taste, abd pain, diarrhea   Tramadol Other (See Comments)    Caused tremors   Codeine Other (See Comments)    REACTION: gi upset- only in high doses per pt.  Can tolerate cough syrup with codeine.     Outpatient Encounter Medications as of 03/04/2021  Medication Sig   acetaminophen (TYLENOL) 325 MG tablet Take 2 tablets (650 mg total) by mouth every 6 (six) hours as needed for mild pain (or Fever >/= 101).  albuterol (VENTOLIN HFA) 108 (90 Base) MCG/ACT inhaler INHALE 2 PUFFS INTO THE LUNGS EVERY 6 HOURS AS NEEDED FOR WHEEZING OR SHORTNESS OF BREATH.   ALPRAZolam (XANAX) 0.5 MG tablet Take 0.5 tablets (0.25 mg total) by mouth at bedtime as needed for up to 30 doses for sleep.   ALPRAZolam (XANAX) 0.5 MG tablet Take 0.5 tablets (0.25 mg total) by mouth at bedtime as needed for up to 30 doses for sleep.   aspirin EC 81 MG tablet Take 1 tablet (81 mg total) by mouth daily. Swallow whole.   cholestyramine (QUESTRAN) 4 g packet Take 1 packet (4 g total) by mouth 2 (two) times daily as needed. diarrhea   Fluticasone-Umeclidin-Vilant (TRELEGY ELLIPTA) 100-62.5-25 MCG/INH AEPB Inhale 1 puff into the lungs daily.   furosemide (LASIX) 20 MG tablet Take 1 tablet (20 mg total) by mouth daily.   insulin aspart (NOVOLOG FLEXPEN) 100 UNIT/ML FlexPen INJECT 12-14 UNITS UNDER THE SKIN AT BREAKFAST, 14-16 UNITS AT LUNCH, AND 12-16 UNITS AT DINNER.   insulin degludec (TRESIBA FLEXTOUCH) 200 UNIT/ML FlexTouch Pen Inject 36 Units into the skin at bedtime.    ipratropium-albuterol (DUONEB) 0.5-2.5 (3) MG/3ML SOLN 1 vial in neb every 8 hours and as needed   magic mouthwash SOLN Swish with 2 teaspoonfuls by mouth as needed   metoprolol succinate (TOPROL-XL) 50 MG 24 hr tablet Take 50 mg by mouth 2 (two) times daily. Take with or immediately following a meal.   mometasone (ELOCON) 0.1 % cream Apply small amount bid to affected areas   nystatin (MYCOSTATIN) 100000 UNIT/ML suspension    nystatin cream (MYCOSTATIN) Apply 1 application topically 2 (two) times daily. Apply to affected red areas on buttock   omeprazole (PRILOSEC) 20 MG capsule TAKE (1) CAPSULE DAILY.   pravastatin (PRAVACHOL) 20 MG tablet Take 1 tablet (20 mg total) by mouth daily.   sodium chloride HYPERTONIC 3 % nebulizer solution 1 ampule neb every 6 hours if needed to clear mucus   No facility-administered encounter medications on file as of 03/04/2021.    Review of Systems  Constitutional:  Positive for fatigue. Negative for appetite change, chills, fever and unexpected weight change.  HENT:  Negative for congestion, dental problem, ear discharge, ear pain, facial swelling, hearing loss, nosebleeds, postnasal drip, rhinorrhea, sinus pressure, sinus pain, sneezing, sore throat, tinnitus and trouble swallowing.   Eyes:  Negative for pain, discharge, redness, itching and visual disturbance.  Respiratory:  Negative for cough, chest tightness, shortness of breath and wheezing.   Cardiovascular:  Negative for chest pain, palpitations and leg swelling.  Gastrointestinal:  Negative for abdominal distention, abdominal pain, blood in stool, constipation, diarrhea, nausea and vomiting.       Has had no more rectal bleeding   Endocrine: Negative for polydipsia, polyphagia and polyuria.  Genitourinary:  Negative for difficulty urinating, dysuria, flank pain, frequency and urgency.       Urine is cloudy and Muggy color  Musculoskeletal:  Negative for arthralgias, back pain, gait problem, joint  swelling and myalgias.  Skin:  Negative for color change, pallor and rash.  Neurological:  Negative for dizziness, syncope, speech difficulty, weakness, light-headedness, numbness and headaches.  Psychiatric/Behavioral:  Negative for agitation, behavioral problems, confusion and sleep disturbance. The patient is not nervous/anxious.    Immunization History  Administered Date(s) Administered   Fluad Quad(high Dose 65+) 01/30/2019, 03/01/2020, 02/08/2021   Influenza Split 01/07/2011, 02/22/2012   Influenza Whole 05/09/2007, 02/05/2010, 01/22/2017   Influenza, High Dose Seasonal PF 02/06/2018  Influenza,inj,Quad PF,6+ Mos 01/21/2013, 03/18/2014, 01/08/2015, 01/13/2016   Janssen (J&J) SARS-COV-2 Vaccination 01/23/2020   Moderna SARS-COV2 Booster Vaccination 05/26/2020, 11/15/2020   Pneumococcal Conjugate-13 11/24/2013   Pneumococcal Polysaccharide-23 04/23/2012   Pertinent  Health Maintenance Due  Topic Date Due   DEXA SCAN  Never done   URINE MICROALBUMIN  05/11/2018   OPHTHALMOLOGY EXAM  12/28/2018   HEMOGLOBIN A1C  08/23/2021   FOOT EXAM  09/01/2021   INFLUENZA VACCINE  Completed   Fall Risk 11/16/2020 11/25/2020 02/22/2021 03/03/2021 03/04/2021  Falls in the past year? 0 0 0 0 0  Was there an injury with Fall? - 0 0 0 0  Fall Risk Category Calculator - 0 0 0 0  Fall Risk Category - Low Low Low Low  Patient Fall Risk Level - Low fall risk Low fall risk Low fall risk Low fall risk  Patient at Risk for Falls Due to - No Fall Risks History of fall(s) No Fall Risks No Fall Risks  Fall risk Follow up Falls evaluation completed Falls evaluation completed Falls evaluation completed;Education provided;Falls prevention discussed Falls evaluation completed Falls evaluation completed   Functional Status Survey:    There were no vitals filed for this visit. There is no height or weight on file to calculate BMI. Physical Exam Unable to complete on telephone visit.  Labs reviewed: Recent  Labs    06/07/20 1220 08/26/20 1023  NA 140 137  K 4.2 4.3  CL 98 94*  CO2 29 26  GLUCOSE 68 155*  BUN 9 13  CREATININE 0.63 0.74  CALCIUM 9.9 9.6   Recent Labs    06/07/20 1220  AST 17  ALT 11  BILITOT 0.4  PROT 7.8   Recent Labs    06/07/20 1220 11/25/20 1224 03/03/21 1126  WBC 8.6 8.1 9.0  NEUTROABS 3,844 4,236 4,833  HGB 14.9 15.2 15.2  HCT 46.8* 47.8* 47.8*  MCV 84.2 85.8 86.0  PLT 198 174 161   Lab Results  Component Value Date   TSH 1.970 01/13/2020   Lab Results  Component Value Date   HGBA1C 11.6 (H) 02/22/2021   Lab Results  Component Value Date   CHOL 178 06/07/2020   HDL 55 06/07/2020   LDLCALC 101 (H) 06/07/2020   LDLDIRECT 89.0 10/14/2014   TRIG 122 06/07/2020   CHOLHDL 3.2 06/07/2020    Significant Diagnostic Results in last 30 days:  DG Chest 2 View  Result Date: 02/09/2021 CLINICAL DATA:  85 year old female with history of left lateral chest wall pain. History of COPD. EXAM: CHEST - 2 VIEW COMPARISON:  Chest x-ray 01/19/2020. FINDINGS: Lung volumes are normal. No consolidative airspace disease. No pleural effusions. No pneumothorax. No pulmonary nodule or mass noted. Pulmonary vasculature and the cardiomediastinal silhouette are within normal limits. Atherosclerosis in the thoracic aorta. Left-sided pacemaker device in place with lead tips projecting over the expected location of the right atrium and right ventricle. IMPRESSION: 1. No radiographic evidence of acute cardiopulmonary disease. 2. Aortic atherosclerosis. Electronically Signed   By: Vinnie Langton M.D.   On: 02/09/2021 13:02    Assessment/Plan 1. Uncontrolled type 2 diabetes mellitus with hyperglycemia (HCC) Lab Results  Component Value Date   HGBA1C 11.6 (H) 02/22/2021  Reports low blood sugars 56 then fluctuate with orange juice.not skipping meals. - Advised to inject Novolog 12-14 units if blood sugar > 150 and hold if <   Reduce Tresiba from 36 units to 34 units daily at  bedtime.  - declined  Nutritionist referral  - Ambulatory referral to Endocrinology - follow up on Monday advised to bring her CBG log for evaluation.   2. Cloudy urine Afebrile Denies any urine frequency,urgency,dysuria or hematuria - advised to bring urine specimen to office for culture rule out UTI  - advised to take Cranberry tablet one by mouth twice daily  - increase water intake   Family/ staff Communication: Reviewed plan of care with patient verbalized understanding   Labs/tests ordered: Urine Culture   Next Appointment: 3 days to evaluate blood sugar and obtain urine specimen for urine culture   I connected with  [name] on 03/04/21 by a Telephone enabled telemedicine application and verified that I am speaking with the correct person using two identifiers.   I discussed the limitations of evaluation and management by telemedicine. The patient expressed understanding and agreed to proceed.   Spent 22  minutes of non-face to face with patient  >50% time spent counseling; reviewing medical record; tests; labs; and developing future plan of care.   Sandrea Hughs, NP

## 2021-03-07 ENCOUNTER — Ambulatory Visit (INDEPENDENT_AMBULATORY_CARE_PROVIDER_SITE_OTHER): Payer: Medicare Other | Admitting: Family

## 2021-03-07 ENCOUNTER — Other Ambulatory Visit: Payer: Self-pay

## 2021-03-07 ENCOUNTER — Other Ambulatory Visit: Payer: Self-pay | Admitting: *Deleted

## 2021-03-07 ENCOUNTER — Encounter: Payer: Self-pay | Admitting: Family

## 2021-03-07 VITALS — BP 140/80 | HR 101 | Temp 97.5°F | Resp 16 | Ht 63.0 in | Wt 155.8 lb

## 2021-03-07 DIAGNOSIS — R8290 Unspecified abnormal findings in urine: Secondary | ICD-10-CM

## 2021-03-07 DIAGNOSIS — R350 Frequency of micturition: Secondary | ICD-10-CM | POA: Diagnosis not present

## 2021-03-07 DIAGNOSIS — E1165 Type 2 diabetes mellitus with hyperglycemia: Secondary | ICD-10-CM | POA: Diagnosis not present

## 2021-03-07 LAB — POCT URINALYSIS DIPSTICK
Bilirubin, UA: NEGATIVE
Glucose, UA: NEGATIVE
Ketones, UA: NEGATIVE
Nitrite, UA: NEGATIVE
Protein, UA: NEGATIVE
Spec Grav, UA: 1.01 (ref 1.010–1.025)
Urobilinogen, UA: NEGATIVE E.U./dL — AB
pH, UA: 5 (ref 5.0–8.0)

## 2021-03-07 MED ORDER — ACCU-CHEK AVIVA PLUS VI STRP: ORAL_STRIP | 1 refills | Status: DC

## 2021-03-07 NOTE — Progress Notes (Signed)
Provider: Avien Taha FNP-C  Wardell Honour, MD  Patient Care Team: Wardell Honour, MD as PCP - General (Family Medicine) Raynelle Bring, MD as Consulting Physician (Urology) Evans Lance, MD as Consulting Physician (Cardiology) Deneise Lever, MD as Consulting Physician (Pulmonary Disease) [facility] [name], MD as Consulting Physician (Endocrinology) Newt Minion, MD as Consulting Physician (Orthopedic Surgery) Raynelle Bring, MD as Consulting Physician (Urology)  Extended Emergency Contact Information Primary Emergency Contact: [facility] Phone: [phone] Relation: Friend  Code Status:  Full Code  Goals of care: Advanced Directive information Advanced Directives 03/07/2021  Does Patient Have a Medical Advance Directive? No  Type of Advance Directive -  Does patient want to make changes to medical advance directive? -  Copy of [place] in Chart? -  Would patient like information on creating a medical advance directive? No - Patient declined  Pre-existing out of facility DNR order (yellow form or pink MOST form) -     Chief Complaint  Patient presents with   Acute Visit    BS Urine .     HPI:  Pt is a 85 y.o. female seen today for an acute visit for evaluation of blood sugar and possible urine infection.states forgot to bring her blood sugar log.states CBG this morning was 144  States over the weekend readings were in the 80's - 200's. Stated tries to eat but sometime does not stuff herself if feeling full. Overall tries to eat three times per day and eat.  Novolog 12 units three times with meals.  Also on Tresiba 36 units in the morning.  Latest Hgb A1C 11.6 ( 02/22/2021 ).    Past Medical History:  Diagnosis Date   Acute and chronic respiratory failure with hypoxia (HCC)    Allergic rhinitis, cause unspecified    Sinus CT Rec 12-23-2009   Anxiety disorder, unspecified    Arthritis     Atrioventricular block, complete (HCC)    Benign paroxysmal positional vertigo 08/09/2012   Chronic airway obstruction, not elsewhere classified    HFA 75-90% after coaching 12-23-2009   Chronic obstructive pulmonary disease, unspecified ([place])    Chronic rhinitis    Cognitive communication deficit    Complication of anesthesia    takes along time to wake up   Constipation, unspecified    COPD mixed type ([place])    Diabetes mellitus    Diarrhea    Diverticulosis    Dizziness and giddiness    Elevated troponin    Esophageal reflux    Esophageal stricture    Essential (primary) hypertension    Gastro-esophageal reflux disease without esophagitis    GERD (gastroesophageal reflux disease)    Glaucoma    Hiatal hernia    History of urinary tract infection    Hyperlipidemia, unspecified    Hypertension    Irritable bowel syndrome    Leukocytosis    Metabolic encephalopathy    Muscle weakness (generalized)    Neuropathy in diabetes ([place]) 08/09/2012   Obesity, unspecified    Oth speech/lang deficits following oth cerebvasc disease    Other abnormalities of gait and mobility    Other chronic nonalcoholic liver disease    Other forms of scoliosis, site unspecified    Other specified diseases of liver    Oxygen deficiency    Presence of permanent cardiac pacemaker 01/14/2020   Sciatica    Scoliosis    Shingles 2010   Shortness of breath  Spinal stenosis, lumbar region, without neurogenic claudication    Steatohepatitis    Thrombocytopenia, unspecified ([place])    Type 2 diabetes mellitus with diabetic polyneuropathy ([place])    Type 2 diabetes mellitus with other diabetic neurological complication ([place])    Unsteadiness on feet    Past Surgical History:  Procedure Laterality Date   APPENDECTOMY     CARPAL TUNNEL RELEASE     right hand   CHOLECYSTECTOMY OPEN  1978   COLONOSCOPY  07-2001   mild diverticulosis   ESOPHAGOGASTRODUODENOSCOPY  7341,93-79   H Hernia,es.stricture s/p dil 39F    INTRAOCULAR LENS INSERTION Bilateral    LIVER BIOPSY  0-2409   PACEMAKER IMPLANT N/A 01/14/2020   Procedure: PACEMAKER IMPLANT;  Surgeon: Evans Lance, MD;  Location: [place] [facility];  Service: Cardiovascular;  Laterality: N/A;   PARATHYROID EXPLORATION     TONSILLECTOMY AND ADENOIDECTOMY     TOTAL ABDOMINAL HYSTERECTOMY     ULNAR NERVE TRANSPOSITION  12/07/2011   Procedure: ULNAR NERVE DECOMPRESSION/TRANSPOSITION;this was cancelled-not done  Surgeon: Cammie Sickle., MD;  Location: [place];  Service: Orthopedics;  Laterality: Right;  right ulnar nerve in situ decompression   ULNAR TUNNEL RELEASE  03/07/2012   Procedure: CUBITAL TUNNEL RELEASE;  Surgeon: Roseanne Kaufman, MD;  Location: [place];  Service: Orthopedics;  Laterality: Right;  ulnar nerve release at the elbow      Allergies  Allergen Reactions   Chlordiazepoxide-Clidinium Other (See Comments)    Sleepy,weak   5-Alpha Reductase Inhibitors    Biaxin [Clarithromycin] Other (See Comments)    Foul taste, abd pain, diarrhea   Tramadol Other (See Comments)    Caused tremors   Codeine Other (See Comments)    REACTION: gi upset- only in high doses per pt.  Can tolerate cough syrup with codeine.     Outpatient Encounter Medications as of 03/07/2021  Medication Sig   acetaminophen (TYLENOL) 325 MG tablet Take 2 tablets (650 mg total) by mouth every 6 (six) hours as needed for mild pain (or Fever >/= 101).   albuterol (VENTOLIN HFA) 108 (90 Base) MCG/ACT inhaler INHALE 2 PUFFS INTO THE LUNGS EVERY 6 HOURS AS NEEDED FOR WHEEZING OR SHORTNESS OF BREATH.   ALPRAZolam (XANAX) 0.5 MG tablet Take 0.5 tablets (0.25 mg total) by mouth at bedtime as needed for up to 30 doses for sleep.   aspirin EC 81 MG tablet Take 1 tablet (81 mg total) by mouth daily. Swallow whole.   cholestyramine (QUESTRAN) 4 g packet Take 1 packet (4 g total) by mouth 2 (two) times daily as needed. diarrhea   Cranberry 475  MG CAPS Take 1 capsule (475 mg total) by mouth 2 (two) times daily.   Fluticasone-Umeclidin-Vilant (TRELEGY ELLIPTA) 100-62.5-25 MCG/INH AEPB Inhale 1 puff into the lungs daily.   insulin aspart (NOVOLOG FLEXPEN) 100 UNIT/ML FlexPen INJECT 12-14 UNITS UNDER THE SKIN AT BREAKFAST, 14-16 UNITS AT LUNCH, AND 12-16 UNITS AT DINNER.   insulin degludec (TRESIBA FLEXTOUCH) 200 UNIT/ML FlexTouch Pen Inject 36 Units into the skin at bedtime.   ipratropium-albuterol (DUONEB) 0.5-2.5 (3) MG/3ML SOLN 1 vial in neb every 8 hours and as needed   magic mouthwash SOLN Swish with 2 teaspoonfuls by mouth as needed   metoprolol succinate (TOPROL-XL) 50 MG 24 hr tablet Take 50 mg by mouth 2 (two) times daily. Take with or immediately following a meal.   mometasone (ELOCON) 0.1 % cream Apply small amount bid to affected  areas   nystatin (MYCOSTATIN) 100000 UNIT/ML suspension    nystatin cream (MYCOSTATIN) Apply 1 application topically 2 (two) times daily. Apply to affected red areas on buttock   omeprazole (PRILOSEC) 20 MG capsule TAKE (1) CAPSULE DAILY.   pravastatin (PRAVACHOL) 20 MG tablet Take 1 tablet (20 mg total) by mouth daily.   sodium chloride HYPERTONIC 3 % nebulizer solution 1 ampule neb every 6 hours if needed to clear mucus   furosemide (LASIX) 20 MG tablet Take 1 tablet (20 mg total) by mouth daily.   [DISCONTINUED] ALPRAZolam (XANAX) 0.5 MG tablet Take 0.5 tablets (0.25 mg total) by mouth at bedtime as needed for up to 30 doses for sleep.   No facility-administered encounter medications on file as of 03/07/2021.    Review of Systems  Constitutional:  Positive for fatigue. Negative for appetite change, chills, fever and unexpected weight change.  Eyes:  Negative for pain, discharge, redness and itching.  Respiratory:  Negative for cough, chest tightness, shortness of breath and wheezing.   Cardiovascular:  Negative for chest pain, palpitations and leg swelling.  Gastrointestinal:  Negative for  abdominal distention, abdominal pain, blood in stool, constipation, diarrhea, nausea and vomiting.  Endocrine: Negative for cold intolerance, heat intolerance, polydipsia, polyphagia and polyuria.  Genitourinary:  Negative for difficulty urinating, dysuria, flank pain, frequency and urgency.       Urine is muggy like dirt river.Has lower abdomen pain   Musculoskeletal:  Positive for gait problem. Negative for arthralgias, back pain, joint swelling, myalgias, neck pain and neck stiffness.  Skin:  Negative for color change, pallor and rash.  Neurological:  Negative for dizziness, syncope, speech difficulty, weakness, light-headedness, numbness and headaches.  Psychiatric/Behavioral:  Negative for agitation, behavioral problems, confusion, hallucinations and sleep disturbance. The patient is not nervous/anxious.    Immunization History  Administered Date(s) Administered   Fluad Quad(high Dose 65+) 01/30/2019, 03/01/2020, 02/08/2021   Influenza Split 01/07/2011, 02/22/2012   Influenza Whole 05/09/2007, 02/05/2010, 01/22/2017   Influenza, High Dose Seasonal PF 02/06/2018   Influenza,inj,Quad PF,6+ Mos 01/21/2013, 03/18/2014, 01/08/2015, 01/13/2016   Janssen (J&J) SARS-COV-2 Vaccination 01/23/2020   Moderna SARS-COV2 Booster Vaccination 05/26/2020, 11/15/2020   Pneumococcal Conjugate-13 11/24/2013   Pneumococcal Polysaccharide-23 04/23/2012   Pertinent  Health Maintenance Due  Topic Date Due   DEXA SCAN  Never done   URINE MICROALBUMIN  05/11/2018   OPHTHALMOLOGY EXAM  12/28/2018   HEMOGLOBIN A1C  08/23/2021   FOOT EXAM  09/01/2021   INFLUENZA VACCINE  Completed   Fall Risk 11/25/2020 02/22/2021 03/03/2021 03/04/2021 03/07/2021  Falls in the past year? 0 0 0 0 0  Was there an injury with Fall? 0 0 0 0 0  Fall Risk Category Calculator 0 0 0 0 0  Fall Risk Category Low Low Low Low Low  Patient Fall Risk Level Low fall risk Low fall risk Low fall risk Low fall risk Low fall risk  Patient at  Risk for Falls Due to No Fall Risks History of fall(s) No Fall Risks No Fall Risks No Fall Risks  Fall risk Follow up Falls evaluation completed Falls evaluation completed;Education provided;Falls prevention discussed Falls evaluation completed Falls evaluation completed Falls evaluation completed   Functional Status Survey:    Vitals:   03/07/21 1018  BP: 140/80  Pulse: (!) 101  Resp: 16  Temp: (!) 97.5 F (36.4 C)  SpO2: (!) 86%  Weight: 155 lb 12.8 oz (70.7 kg)  Height: 5' 3"  (1.6 m)   Body mass index  is 27.6 kg/m. Physical Exam Vitals reviewed.  Constitutional:      General: She is not in acute distress.    Appearance: Normal appearance. She is normal weight. She is not ill-appearing or diaphoretic.  Eyes:     General: No scleral icterus.       Right eye: No discharge.        Left eye: No discharge.     Conjunctiva/sclera: Conjunctivae normal.     Pupils: Pupils are equal, round, and reactive to light.  Cardiovascular:     Rate and Rhythm: Normal rate and regular rhythm.     Pulses: Normal pulses.     Heart sounds: Normal heart sounds. No murmur heard.   No friction rub. No gallop.  Pulmonary:     Effort: Pulmonary effort is normal. No respiratory distress.     Breath sounds: Normal breath sounds. No wheezing, rhonchi or rales.  Chest:     Chest wall: No tenderness.  Abdominal:     General: Bowel sounds are normal. There is no distension.     Palpations: Abdomen is soft. There is no mass.     Tenderness: There is no abdominal tenderness. There is no right CVA tenderness, left CVA tenderness, guarding or rebound.  Musculoskeletal:        General: No swelling or tenderness. Normal range of motion.     Right lower leg: No edema.     Left lower leg: No edema.  Skin:    General: Skin is warm and dry.     Coloration: Skin is not pale.     Findings: No bruising, erythema, lesion or rash.  Neurological:     Mental Status: She is alert. Mental status is at baseline.      Motor: No weakness.     Gait: Gait abnormal.  Psychiatric:        Mood and Affect: Mood normal.        Speech: Speech normal.        Behavior: Behavior normal.        Thought Content: Thought content normal.        Judgment: Judgment normal.    Labs reviewed: Recent Labs    06/07/20 1220 08/26/20 1023  NA 140 137  K 4.2 4.3  CL 98 94*  CO2 29 26  GLUCOSE 68 155*  BUN 9 13  CREATININE 0.63 0.74  CALCIUM 9.9 9.6   Recent Labs    06/07/20 1220  AST 17  ALT 11  BILITOT 0.4  PROT 7.8   Recent Labs    06/07/20 1220 11/25/20 1224 03/03/21 1126  WBC 8.6 8.1 9.0  NEUTROABS 3,844 4,236 4,833  HGB 14.9 15.2 15.2  HCT 46.8* 47.8* 47.8*  MCV 84.2 85.8 86.0  PLT 198 174 161   Lab Results  Component Value Date   TSH 1.970 01/13/2020   Lab Results  Component Value Date   HGBA1C 11.6 (H) 02/22/2021   Lab Results  Component Value Date   CHOL 178 06/07/2020   HDL 55 06/07/2020   LDLCALC 101 (H) 06/07/2020   LDLDIRECT 89.0 10/14/2014   TRIG 122 06/07/2020   CHOLHDL 3.2 06/07/2020    Significant Diagnostic Results in last 30 days:  DG Chest 2 View  Result Date: 02/09/2021 CLINICAL DATA:  85 year old female with history of left lateral chest wall pain. History of COPD. EXAM: CHEST - 2 VIEW COMPARISON:  Chest x-ray 01/19/2020. FINDINGS: Lung volumes are normal. No consolidative airspace disease. No  pleural effusions. No pneumothorax. No pulmonary nodule or mass noted. Pulmonary vasculature and the cardiomediastinal silhouette are within normal limits. Atherosclerosis in the thoracic aorta. Left-sided pacemaker device in place with lead tips projecting over the expected location of the right atrium and right ventricle. IMPRESSION: 1. No radiographic evidence of acute cardiopulmonary disease. 2. Aortic atherosclerosis. Electronically Signed   By: Vinnie Langton M.D.   On: 02/09/2021 13:02    Assessment/Plan 1. Cloudy urine Encouraged to increase water intake daily  -  POC Urinalysis Dipstick indicates very cloudy urine with large blood and leukocytes 3 + but negative for nitrites. Will send urine for culture made aware results will return in 2-3 days. - Notify provider is symptoms worsen  - Urine Culture  2. Urinary frequency Reports frequent urination possible UTI  - POC Urinalysis Dipstick - Urine Culture  3. Uncontrolled type 2 diabetes mellitus with hyperglycemia (HCC)  Lab Results  Component Value Date   HGBA1C 11.6 (H) 02/22/2021   Reports CBG in the 80's -200's  Had CBG in the 50's last week was instructed to bring her CBG log to visit today but states forgot glucometer on her dinning tablet.  - continue on Novolog 12 units three times with meals  - Continue on Tresiba 36 units in the morning  - Eat bedtime snack at bedtime  - Notify provider if blood sugar is less than 75   Family/ staff Communication: Reviewed plan of care with patient verbalized understanding   Labs/tests ordered:  - POC Urinalysis Dipstick - Urine Culture  Next Appointment: As needed if symptoms worsen or fail to improve    Sandrea Hughs, NP

## 2021-03-07 NOTE — Telephone Encounter (Signed)
Pharmacy requested refill

## 2021-03-07 NOTE — Patient Instructions (Addendum)
-   continue on Novolog 12 units three times with meals   - Continue on Tresiba 36 units in the morning   - Eat bedtime snack at bedtime   - Notify provider if blood sugar is less than 75

## 2021-03-08 ENCOUNTER — Telehealth: Payer: Self-pay | Admitting: Pharmacy Technician

## 2021-03-08 ENCOUNTER — Other Ambulatory Visit (HOSPITAL_COMMUNITY): Payer: Self-pay

## 2021-03-08 LAB — URINE CULTURE
MICRO NUMBER:: 12575180
SPECIMEN QUALITY:: ADEQUATE

## 2021-03-08 NOTE — Telephone Encounter (Signed)
Patient Advocate Encounter   Received notification from CoverMyMeds that prior authorization for Stiolto is required by his/her insurance [employer].  Per Test Claim: Anoro or Charolotte Eke is preferred, but pt has taken both of those in the past.   PA submitted on 03/08/21 Key [id number] Status is pending    [facility] will continue to follow:   Armanda Magic, CPhT Patient [facility] Phone: [phone] Fax:  [phone]

## 2021-03-09 ENCOUNTER — Other Ambulatory Visit (HOSPITAL_COMMUNITY): Payer: Self-pay

## 2021-03-09 ENCOUNTER — Other Ambulatory Visit: Payer: Self-pay

## 2021-03-09 MED ORDER — SACCHAROMYCES BOULARDII 250 MG PO CAPS: 250.0000 mg | ORAL_CAPSULE | Freq: Two times a day (BID) | ORAL | 0 refills | Status: AC

## 2021-03-09 MED ORDER — STIOLTO RESPIMAT 2.5-2.5 MCG/ACT IN AERS: 2.0000 | INHALATION_SPRAY | Freq: Every day | RESPIRATORY_TRACT | 11 refills | Status: DC

## 2021-03-09 MED ORDER — CIPROFLOXACIN HCL 500 MG PO TABS: 500.0000 mg | ORAL_TABLET | Freq: Two times a day (BID) | ORAL | 0 refills | Status: AC

## 2021-03-09 NOTE — Telephone Encounter (Signed)
LMTCB with patient to confirm what she is taking.   I do not see Stiolto on patient medication profile. I do see in the note where it states the patient is intolerant of Stiolto on 02/08/21 by CY.   CY please advise. LMTCB with patient. PA was approved for [employer] however your note states she was intolerant to [employer]. Should patient be using Stiolto or trelegy? Thanks :)

## 2021-03-09 NOTE — Telephone Encounter (Signed)
Ok to refill Stiolto x 1 year Please leave nebulizer med on her list for prn use this winter

## 2021-03-09 NOTE — Addendum Note (Signed)
Addended by: Elby Beck R on: 03/09/2021 11:23 AM   Modules accepted: Orders

## 2021-03-09 NOTE — Telephone Encounter (Signed)
Patient Advocate Encounter  Prior Authorization for [name] has been approved.    PA# no PA # given Effective dates: 05/08/20 through 03/08/22  Per Test Claim Patients co-pay is $0.   Not in pt's profile for me to call the pharmacy  Armanda Magic, [place] Patient [facility] Phone: [phone] Fax:  [phone]

## 2021-03-09 NOTE — Addendum Note (Signed)
Addended by: Elby Beck R on: 03/09/2021 10:51 AM   Modules accepted: Orders

## 2021-03-09 NOTE — Telephone Encounter (Signed)
Called and spoke with patient to let her know the recs from Dr. Annamaria Boots. She expressed understanding about Stiolto and as well to keep her nebulizer and use it PRN for the winter. She expressed understanding. Called and spoke with [facility] to let them know that PA has been approved. They stated they needed an RX sent over so they can run it. New RX sent. Advised them to call us if there is any issues once they get the RX. They expressed understanding. Nothing further needed at this time.

## 2021-03-09 NOTE — Telephone Encounter (Signed)
Spoke with patient to see what inhalers and medications she is currently on. Patient states that she has her Albuterol inhaler and has been doing her nebulizer medications. Asked her if she was taking Stiolto or Trelegy and patient stated she was not. She stated that she feels like Stiolto worked well for her and she liked it more than the breathing treatments but her insurance wouldn't pay for it.  Advised her that the pharmacy team was able to get [place] approved for her through her insurance. She stated that was great news and that she would like to use it instead of nebulizer because it takes much longer and her cats get in the way of the tubing when she is using it. She also feels like the stiolto helped her more. Advised her I would check with Dr. Annamaria Boots to see if he is ok with her switching back to [facility] since its been approved.   Dr. Annamaria Boots please advise

## 2021-03-29 ENCOUNTER — Telehealth: Payer: Self-pay | Admitting: Internal Medicine

## 2021-03-30 NOTE — Telephone Encounter (Signed)
PCC's please advise if an order has been received by [facility] for the POC.  thanks

## 2021-03-30 NOTE — Telephone Encounter (Signed)
I have called [name] with [facility] and he is going to fax the order to [phone].  Will forward back to [place] to make her aware.

## 2021-04-04 ENCOUNTER — Telehealth: Payer: Self-pay | Admitting: Internal Medicine

## 2021-04-04 MED ORDER — AMOXICILLIN 500 MG PO TABS: 500.0000 mg | ORAL_TABLET | Freq: Two times a day (BID) | ORAL | 0 refills | Status: AC

## 2021-04-04 NOTE — Telephone Encounter (Signed)
Called and spoke with patient. She verbalized understanding. RX for amoxicillin has been sent in.   Nothing further needed at time of call.

## 2021-04-04 NOTE — Telephone Encounter (Signed)
Called pt again and it goes straight to busy tone- x 3  Will call back tomorrow

## 2021-04-04 NOTE — Telephone Encounter (Signed)
Patient is returning phone call. Patient phone number is [phone].

## 2021-04-04 NOTE — Telephone Encounter (Signed)
Called and spoke with patient. She stated that she has had a dry cough for the past 2 weeks. She had a coughing spell this morning and was able to cough up thick green phlegm. She is pretty sure that she has bronchitis again. She denied any fevers or body aches. Also denied any worsening SOB. She is still using her O2 at 2-3L.   She wanted to know if CY would be willing to send in an abx for her before it gets worse.   Pharmacy is [facility].   CY, can you please advise? Thanks!

## 2021-04-04 NOTE — Telephone Encounter (Signed)
Amoxacllin 500 mg # 14      1 twice daily Stay well hydrated

## 2021-04-04 NOTE — Telephone Encounter (Signed)
Called pt and there was no answer-LMTCB °

## 2021-04-13 ENCOUNTER — Ambulatory Visit (INDEPENDENT_AMBULATORY_CARE_PROVIDER_SITE_OTHER): Payer: Medicare Other

## 2021-04-13 DIAGNOSIS — I442 Atrioventricular block, complete: Secondary | ICD-10-CM

## 2021-04-13 DIAGNOSIS — Z950 Presence of cardiac pacemaker: Secondary | ICD-10-CM | POA: Diagnosis not present

## 2021-04-13 LAB — CUP PACEART REMOTE DEVICE CHECK
Battery Remaining Longevity: 113 mo
Battery Remaining Percentage: 91 %
Battery Voltage: 3.02 V
Brady Statistic AP VP Percent: 1.9 %
Brady Statistic AP VS Percent: 1 %
Brady Statistic AS VP Percent: 82 %
Brady Statistic AS VS Percent: 15 %
Brady Statistic RA Percent Paced: 2.3 %
Brady Statistic RV Percent Paced: 84 %
Date Time Interrogation Session: 20221207020016
Implantable Lead Implant Date: 20210908
Implantable Lead Implant Date: 20210908
Implantable Lead Location: 753859
Implantable Lead Location: 753860
Implantable Pulse Generator Implant Date: 20210908
Lead Channel Impedance Value: 580 Ohm
Lead Channel Impedance Value: 580 Ohm
Lead Channel Pacing Threshold Amplitude: 0.75 V
Lead Channel Pacing Threshold Amplitude: 0.75 V
Lead Channel Pacing Threshold Pulse Width: 0.4 ms
Lead Channel Pacing Threshold Pulse Width: 0.5 ms
Lead Channel Sensing Intrinsic Amplitude: 12 mV
Lead Channel Sensing Intrinsic Amplitude: 2 mV
Lead Channel Setting Pacing Amplitude: 1 V
Lead Channel Setting Pacing Amplitude: 2 V
Lead Channel Setting Pacing Pulse Width: 0.5 ms
Lead Channel Setting Sensing Sensitivity: 2 mV
Pulse Gen Model: 2272
Pulse Gen Serial Number: 3856705

## 2021-04-20 NOTE — Telephone Encounter (Signed)
Marjory Lies from [facility] is returning phone call. Marjory Lies phone number is [phone].

## 2021-04-20 NOTE — Telephone Encounter (Signed)
I still havent got anything on this

## 2021-04-20 NOTE — Telephone Encounter (Signed)
Spoke with Marjory Lies, he states that he already got what he needed on this pt and nothing further needed

## 2021-04-20 NOTE — Telephone Encounter (Signed)
[name] at [place] but he did not answer. Left detailed message asking him to re fax the paperwork to the office.

## 2021-04-22 NOTE — Progress Notes (Signed)
Remote pacemaker transmission.   

## 2021-04-25 ENCOUNTER — Other Ambulatory Visit: Payer: Self-pay | Admitting: Family Medicine

## 2021-04-25 ENCOUNTER — Ambulatory Visit (INDEPENDENT_AMBULATORY_CARE_PROVIDER_SITE_OTHER): Payer: Medicare Other | Admitting: Internal Medicine

## 2021-04-25 ENCOUNTER — Encounter: Payer: Self-pay | Admitting: Internal Medicine

## 2021-04-25 ENCOUNTER — Other Ambulatory Visit: Payer: Self-pay

## 2021-04-25 VITALS — BP 128/70 | HR 96 | Ht 63.0 in | Wt 153.0 lb

## 2021-04-25 DIAGNOSIS — I442 Atrioventricular block, complete: Secondary | ICD-10-CM

## 2021-04-25 DIAGNOSIS — K219 Gastro-esophageal reflux disease without esophagitis: Secondary | ICD-10-CM

## 2021-04-25 DIAGNOSIS — Z950 Presence of cardiac pacemaker: Secondary | ICD-10-CM | POA: Diagnosis not present

## 2021-04-25 NOTE — Patient Instructions (Addendum)
Medication Instructions:  Your physician recommends that you continue on your current medications as directed. Please refer to the Current Medication list given to you today.  Labwork: None ordered.  Testing/Procedures: None ordered.  Follow-Up: Your physician wants you to follow-up in: one year with Cristopher Peru, MD  You will receive a reminder letter in the mail two months in advance. If you don't receive a letter, please call our office to schedule the follow-up appointment.  Remote monitoring is used to monitor your Pacemaker from home. This monitoring reduces the number of office visits required to check your device to one time per year. It allows [place] to keep an eye on the functioning of your device to ensure it is working properly. You are scheduled for a device check from home on 07/13/2021. You may send your transmission at any time that day. If you have a wireless device, the transmission will be sent automatically. After your physician reviews your transmission, you will receive a postcard with your next transmission date.  Any Other Special Instructions Will Be Listed Below (If Applicable).  If you need a refill on your cardiac medications before your next appointment, please call your pharmacy.

## 2021-04-25 NOTE — Progress Notes (Signed)
HPI Mrs. Borgmeyer returns today for followup. She is a pleasant 85 yo woman with a h/o CHB, s/p PPM insertion 15 months ago. She has done well in the interim with no chest pain or sob. She denies syncope.  Allergies  Allergen Reactions   Chlordiazepoxide-Clidinium Other (See Comments)    Sleepy,weak   5-Alpha Reductase Inhibitors    Biaxin [Clarithromycin] Other (See Comments)    Foul taste, abd pain, diarrhea   Tramadol Other (See Comments)    Caused tremors   Codeine Other (See Comments)    REACTION: gi upset- only in high doses per pt.  Can tolerate cough syrup with codeine.      Current Outpatient Medications  Medication Sig Dispense Refill   acetaminophen (TYLENOL) 325 MG tablet Take 2 tablets (650 mg total) by mouth every 6 (six) hours as needed for mild pain (or Fever >/= 101).     albuterol (VENTOLIN HFA) 108 (90 Base) MCG/ACT inhaler INHALE 2 PUFFS INTO THE LUNGS EVERY 6 HOURS AS NEEDED FOR WHEEZING OR SHORTNESS OF BREATH. 18 g 12   ALPRAZolam (XANAX) 0.5 MG tablet Take 0.5 tablets (0.25 mg total) by mouth at bedtime as needed for up to 30 doses for sleep. 15 tablet 0   aspirin EC 81 MG tablet Take 1 tablet (81 mg total) by mouth daily. Swallow whole. 90 tablet 3   cholestyramine (QUESTRAN) 4 g packet Take 1 packet (4 g total) by mouth 2 (two) times daily as needed. diarrhea 60 each 12   glucose blood (ACCU-CHEK AVIVA PLUS) test strip Use to test blood sugar four times daily. Dx: E11.9 400 each 1   insulin aspart (NOVOLOG FLEXPEN) 100 UNIT/ML FlexPen INJECT 12-14 UNITS UNDER THE SKIN AT BREAKFAST, 14-16 UNITS AT LUNCH, AND 12-16 UNITS AT DINNER. 15 mL 3   insulin degludec (TRESIBA FLEXTOUCH) 200 UNIT/ML FlexTouch Pen Inject 36 Units into the skin at bedtime. 15 mL 3   ipratropium-albuterol (DUONEB) 0.5-2.5 (3) MG/3ML SOLN 1 vial in neb every 8 hours and as needed 90 mL 0   magic mouthwash SOLN Swish with 2 teaspoonfuls by mouth as needed 240 mL 0   metoprolol succinate  (TOPROL-XL) 50 MG 24 hr tablet Take 50 mg by mouth 2 (two) times daily. Take with or immediately following a meal.     mometasone (ELOCON) 0.1 % cream Apply small amount bid to affected areas 15 g 1   nystatin (MYCOSTATIN) 100000 UNIT/ML suspension      nystatin cream (MYCOSTATIN) Apply 1 application topically 2 (two) times daily. Apply to affected red areas on buttock 30 g 0   omeprazole (PRILOSEC) 20 MG capsule TAKE (1) CAPSULE DAILY. 90 capsule 3   pravastatin (PRAVACHOL) 20 MG tablet Take 1 tablet (20 mg total) by mouth daily. 90 tablet 1   sodium chloride HYPERTONIC 3 % nebulizer solution 1 ampule neb every 6 hours if needed to clear mucus 75 mL 0   Tiotropium Bromide-Olodaterol (STIOLTO RESPIMAT) 2.5-2.5 MCG/ACT AERS Inhale 2 puffs into the lungs daily. 4 g 11   furosemide (LASIX) 20 MG tablet Take 1 tablet (20 mg total) by mouth daily. 30 tablet 11   No current facility-administered medications for this visit.     Past Medical History:  Diagnosis Date   Acute and chronic respiratory failure with hypoxia (HCC)    Allergic rhinitis, cause unspecified    Sinus CT Rec 12-23-2009   Anxiety disorder, unspecified    Arthritis  Atrioventricular block, complete (HCC)    Benign paroxysmal positional vertigo 08/09/2012   Chronic airway obstruction, not elsewhere classified    HFA 75-90% after coaching 12-23-2009   Chronic obstructive pulmonary disease, unspecified ([place])    Chronic rhinitis    Cognitive communication deficit    Complication of anesthesia    takes along time to wake up   Constipation, unspecified    COPD mixed type ([place])    Diabetes mellitus    Diarrhea    Diverticulosis    Dizziness and giddiness    Elevated troponin    Esophageal reflux    Esophageal stricture    Essential (primary) hypertension    Gastro-esophageal reflux disease without esophagitis    GERD (gastroesophageal reflux disease)    Glaucoma    Hiatal hernia    History of urinary tract infection     Hyperlipidemia, unspecified    Hypertension    Irritable bowel syndrome    Leukocytosis    Metabolic encephalopathy    Muscle weakness (generalized)    Neuropathy in diabetes ([place]) 08/09/2012   Obesity, unspecified    Oth speech/lang deficits following oth cerebvasc disease    Other abnormalities of gait and mobility    Other chronic nonalcoholic liver disease    Other forms of scoliosis, site unspecified    Other specified diseases of liver    Oxygen deficiency    Presence of permanent cardiac pacemaker 01/14/2020   Sciatica    Scoliosis    Shingles 2010   Shortness of breath    Spinal stenosis, lumbar region, without neurogenic claudication    Steatohepatitis    Thrombocytopenia, unspecified (HCC)    Type 2 diabetes mellitus with diabetic polyneuropathy ([place])    Type 2 diabetes mellitus with other diabetic neurological complication (HCC)    Unsteadiness on feet     ROS:   All systems reviewed and negative except as noted in the HPI.   Past Surgical History:  Procedure Laterality Date   APPENDECTOMY     CARPAL TUNNEL RELEASE     right hand   CHOLECYSTECTOMY OPEN  1978   COLONOSCOPY  07-2001   mild diverticulosis   ESOPHAGOGASTRODUODENOSCOPY  4008,67-61   H Hernia,es.stricture s/p dil 61F   INTRAOCULAR LENS INSERTION Bilateral    LIVER BIOPSY  01-5092   PACEMAKER IMPLANT N/A 01/14/2020   Procedure: PACEMAKER IMPLANT;  Surgeon: Evans Lance, MD;  Location: [facility];  Service: Cardiovascular;  Laterality: N/A;   PARATHYROID EXPLORATION     TONSILLECTOMY AND ADENOIDECTOMY     TOTAL ABDOMINAL HYSTERECTOMY     ULNAR NERVE TRANSPOSITION  12/07/2011   Procedure: ULNAR NERVE DECOMPRESSION/TRANSPOSITION;this was cancelled-not done  Surgeon: Cammie Sickle., MD;  Location: [facility];  Service: Orthopedics;  Laterality: Right;  right ulnar nerve in situ decompression   ULNAR TUNNEL RELEASE  03/07/2012   Procedure: CUBITAL TUNNEL RELEASE;  Surgeon:  Roseanne Kaufman, MD;  Location: [facility];  Service: Orthopedics;  Laterality: Right;  ulnar nerve release at the elbow       Family History  Problem Relation Age of Onset   Heart disease Father    Lung cancer Mother        small cell;Byssinosis   Lung cancer Sister    Liver cancer Sister        ? mets from another area of the body   Diabetes Other        grandmother   Stroke  Maternal Grandfather      Social History   Socioeconomic History   Marital status: Single    Spouse name: John,divorced, deceased   Number of children: 0   Years of education: high school   Highest education level: High school graduate  Occupational History   Occupation: Retired     Comment: Training and development officer  Tobacco Use   Smoking status: Former    Types: Cigarettes    Quit date: 05/08/1988    Years since quitting: 32.9   Smokeless tobacco: Never  Vaping Use   Vaping Use: Never used  Substance and Sexual Activity   Alcohol use: No    Alcohol/week: 0.0 standard drinks   Drug use: No   Sexual activity: Not Currently  Other Topics Concern   Not on file  Social History Narrative   Not on file   Social Determinants of Health   Financial Resource Strain: Not on file  Food Insecurity: No Food Insecurity   Worried About Charity fundraiser in the Last Year: Never true   [place] in the Last Year: Never true  Transportation Needs: No Transportation Needs   Lack of Transportation (Medical): No   Lack of Transportation (Non-Medical): No  Physical Activity: Not on file  Stress: No Stress Concern Present   Feeling of Stress : Only a little  Social Connections: Not on file  Intimate Partner Violence: Not At Risk   Fear of Current or Ex-Partner: No   Emotionally Abused: No   Physically Abused: No   Sexually Abused: No     BP 128/70    Pulse 96    Ht 5' 3"  (1.6 m)    Wt 153 lb (69.4 kg)    SpO2 93%    BMI 27.10 kg/m   Physical Exam:  Well appearing NAD HEENT:  Unremarkable Neck:  No JVD, no thyromegally Lymphatics:  No adenopathy Back:  No CVA tenderness Lungs:  Clear with no wheezes HEART:  Regular rate rhythm, no murmurs, no rubs, no clicks Abd:  soft, positive bowel sounds, no organomegally, no rebound, no guarding Ext:  2 plus pulses, no edema, no cyanosis, no clubbing Skin:  No rashes no nodules Neuro:  CN II through XII intact, motor grossly intact  DEVICE  Normal device function.  See PaceArt for details.   Assess/Plan:  1. CHB - she is asymptomatic, s/p PPM insertion. 2. PPM -her [employer] DDD PM is working normally. We will recheck in several months. 3. HTN - her sbp is elevated and I asked her to reduce her sodium intake. Also I asked her to increase her toprol to 50 bid. 4. Dyslipidemia - she will continue pravastatin and Questran   Carleene Overlie Gustave Lindeman,MD

## 2021-04-29 ENCOUNTER — Telehealth: Payer: Self-pay | Admitting: Internal Medicine

## 2021-04-29 MED ORDER — AMOXICILLIN 500 MG PO TABS: 500.0000 mg | ORAL_TABLET | Freq: Two times a day (BID) | ORAL | 0 refills | Status: DC

## 2021-04-29 NOTE — Telephone Encounter (Signed)
Amoxacillin 500 mg, # 14, 1 twice daily

## 2021-04-29 NOTE — Telephone Encounter (Signed)
I spoke with the pt and notified of response per Dr Annamaria Boots  She verbalized understanding  Rx was sent to [facility]

## 2021-04-29 NOTE — Telephone Encounter (Signed)
Spoke with the pt  She is c/o increased cough x 2 days  She is coughing up minimal dark green sputum  She states "it feels like it's deep in my chest" She denies and fevers, aches, increased SOB  She has minimal wheezing "no more than usual"  She has had her flu vaccine and denies any sick contacts  She is using her neb 1-2 x per day  She asks for abx to be senf  Please advise, thanks!  Allergies  Allergen Reactions   Chlordiazepoxide-Clidinium Other (See Comments)    Sleepy,weak   5-Alpha Reductase Inhibitors    Biaxin [Clarithromycin] Other (See Comments)    Foul taste, abd pain, diarrhea   Tramadol Other (See Comments)    Caused tremors   Codeine Other (See Comments)    REACTION: gi upset- only in high doses per pt.  Can tolerate cough syrup with codeine.

## 2021-05-12 ENCOUNTER — Telehealth: Payer: Self-pay

## 2021-05-12 NOTE — Telephone Encounter (Signed)
Patient called wanting to be seen with [name] Given next week for her leg/knee pain I told her he was booked and she asked that I send you a message Her callback [phone]

## 2021-05-13 NOTE — Telephone Encounter (Signed)
Can you please call pt and make an appt for next week with Dr. Sharol Given or Junie Panning please?

## 2021-05-15 ENCOUNTER — Encounter: Payer: Self-pay | Admitting: Gastroenterology

## 2021-05-16 NOTE — Telephone Encounter (Signed)
She needs appt- PLEASE OFFER APPT.    LMTCB and forwarding to triage as urgent since she has symptoms

## 2021-05-16 NOTE — Telephone Encounter (Signed)
Called and spoke with pt stating to her that we needed her to come in for an appt due to her not being any better after taking the amoxicillin as prescribed by Dr. Annamaria Boots. Pt said right now she could not make an appt due to having some upcoming appts already scheduled this week as she has hurt her leg and needs to get that looked at.  Stated to pt again that we needed her to make an appt since prior recs did not help with pt's symptom and she wanted to know if there was anything that she could try to see if it would help with the mucus as she feels like the mucus is strangling her at times. Stated to her that she could try taking mucinex to see if that would help and she verbalized understanding. Pt said she would call back later in the week to schedule an appt. Nothing further needed.

## 2021-05-18 ENCOUNTER — Ambulatory Visit (INDEPENDENT_AMBULATORY_CARE_PROVIDER_SITE_OTHER): Payer: Medicare Other | Admitting: Family

## 2021-05-18 ENCOUNTER — Encounter: Payer: Self-pay | Admitting: Family

## 2021-05-18 DIAGNOSIS — H04123 Dry eye syndrome of bilateral lacrimal glands: Secondary | ICD-10-CM | POA: Diagnosis not present

## 2021-05-18 DIAGNOSIS — Z961 Presence of intraocular lens: Secondary | ICD-10-CM | POA: Diagnosis not present

## 2021-05-18 DIAGNOSIS — M5416 Radiculopathy, lumbar region: Secondary | ICD-10-CM | POA: Diagnosis not present

## 2021-05-18 DIAGNOSIS — H35372 Puckering of macula, left eye: Secondary | ICD-10-CM | POA: Diagnosis not present

## 2021-05-18 DIAGNOSIS — H5212 Myopia, left eye: Secondary | ICD-10-CM | POA: Diagnosis not present

## 2021-05-18 DIAGNOSIS — H18519 Endothelial corneal dystrophy, unspecified eye: Secondary | ICD-10-CM | POA: Diagnosis not present

## 2021-05-18 MED ORDER — PREDNISONE 20 MG PO TABS: 20.0000 mg | ORAL_TABLET | Freq: Every day | ORAL | 0 refills | Status: DC

## 2021-05-18 NOTE — Progress Notes (Signed)
Office Visit Note   Patient: [name]           Date of Birth: Dec 26, 1935           MRN: [id number] Visit Date: 05/18/2021              Requested by: Wardell Honour, MD [address] [place],  [place] [address] PCP: Wardell Honour, MD  Chief Complaint  Patient presents with   Left Leg - Pain      HPI: The patient is an 86 year old woman who presents today complaining left leg pain.  She states that this began several weeks ago and the lateral aspect of her left foot and has since moved up her lateral leg and has "settled in her knee."  She has increased pain in her knee and lateral lower leg with weightbearing difficulty getting comfortable in bed this radiates to her foot no numbness no tingling no swelling she is does have low back pain.  She has a history of sciatica, scoliosis states her pelvis is uneven as well.  She has been using Tylenol as needed for her pain  States she has had ESI's in the past without relief  Assessment & Plan: Visit Diagnoses:  1. Left lumbar radiculopathy     Plan: Suspect that the pain she is experiencing in her knee and lower leg and foot are related to lumbar radiculopathy.  We will place her on a prednisone course.  Follow-Up Instructions: No follow-ups on file.   Left Knee Exam  Left knee exam is normal.   Back Exam   Tenderness  The patient is experiencing tenderness in the lumbar.  Muscle Strength  Right Quadriceps:  3/5  Left Quadriceps:  3/5  Right Hamstrings:  3/5  Left Hamstrings:  3/5   Tests  Straight leg raise left: positive  Other  Gait: abnormal   Comments:  Shuffling gait     Patient is alert, oriented, no adenopathy, well-dressed, normal affect, normal respiratory effort.   Imaging: No results found. No images are attached to the encounter.  Labs: Lab Results  Component Value Date   HGBA1C 11.6 (H) 02/22/2021   HGBA1C 11.0 (H) 10/12/2020   HGBA1C 10.8 (H) 06/07/2020   LABURIC 4.1  11/16/2014   REPTSTATUS 01/20/2020 FINAL 01/19/2020   CULT MULTIPLE SPECIES PRESENT, SUGGEST RECOLLECTION (A) 01/19/2020   LABORGA KLEBSIELLA PNEUMONIAE (A) 01/13/2020     Lab Results  Component Value Date   ALBUMIN 3.1 (L) 01/14/2020   ALBUMIN 3.4 (L) 01/13/2020   ALBUMIN 4.0 08/04/2019    Lab Results  Component Value Date   MG 2.3 01/15/2020   MG 1.7 01/13/2020   MG 1.6 12/20/2016   Lab Results  Component Value Date   VD25OH 38.42 12/09/2018   VD25OH 19.95 (L) 02/06/2018    No results found for: PREALBUMIN CBC EXTENDED Latest Ref Rng & Units 03/03/2021 11/25/2020 06/07/2020  WBC 3.8 - 10.8 Thousand/uL 9.0 8.1 8.6  RBC 3.80 - 5.10 Million/uL 5.56(H) 5.57(H) 5.56(H)  HGB 11.7 - 15.5 g/dL 15.2 15.2 14.9  HCT 35.0 - 45.0 % 47.8(H) 47.8(H) 46.8(H)  PLT 140 - 400 Thousand/uL 161 174 198  NEUTROABS 1,500 - 7,800 cells/uL 4,833 4,236 3,844  LYMPHSABS 850 - 3,900 cells/uL 3,375 3,013 3,999(H)     There is no height or weight on file to calculate BMI.  Orders:  No orders of the defined types were placed in this encounter.  Meds ordered this encounter  Medications   predniSONE (DELTASONE) 20 MG tablet    Sig: Take 1 tablet (20 mg total) by mouth daily with breakfast.    Dispense:  20 tablet    Refill:  0     Procedures: No procedures performed  Clinical Data: No additional findings.  ROS:  All other systems negative, except as noted in the HPI. Review of Systems  Objective: Vital Signs: There were no vitals taken for this visit.  Specialty Comments:  No specialty comments available.  PMFS History: Patient Active Problem List   Diagnosis Date Noted   Seborrheic dermatitis 10/12/2020   Pain due to onychomycosis of toenails of both feet 09/01/2020   Sjogren's syndrome with keratoconjunctivitis sicca ([place]) 06/07/2020   Mild cognitive impairment with memory loss 01/29/2020   Full code status 01/26/2020   UTI (urinary tract infection) 01/19/2020   Pacemaker  01/19/2020   Leukocytosis 01/19/2020   Pressure injury of skin 01/14/2020   Complete heart block (HCC)    Acute metabolic encephalopathy    Acute on chronic respiratory failure with hypoxia (HCC)    Elevated troponin    Palpitation 01/12/2020   Scoliosis due to degenerative disease of spine in adult patient 09/10/2018   Radicular pain of left lower extremity 09/10/2018   Hand arthritis 10/11/2017   Type 2 diabetes mellitus ([place]) 03/08/2017   Diarrhea in adult patient 11/08/2016   Thrombocytopenia ([place]) 11/08/2016   Degenerative lumbar spinal stenosis 10/27/2015   Arthralgia 09/22/2015   Chronic respiratory failure with hypoxia ([place]) 01/11/2015   Diabetic polyneuropathy associated with type 2 diabetes mellitus ([place]) 11/16/2014   Hypertension 05/19/2013   Vertigo 05/11/2013   Dizziness 05/11/2013   Tachycardia 05/11/2013   Lower urinary tract infectious disease 05/11/2013   Sinusitis 05/11/2013   Hyperlipidemia 02/15/2013   Type II diabetes mellitus, uncontrolled 01/01/2013   Benign paroxysmal positional vertigo 08/09/2012   Type II or unspecified type diabetes mellitus with neurological manifestations, uncontrolled(250.62) 08/09/2012   GLAUCOMA 03/16/2010   HOARSENESS 03/16/2010   COUGH 12/23/2009   Chest wall pain 12/23/2009   MUSCULOSKELETAL PAIN 01/28/2009   COPD mixed type ([place]) 01/15/2009   Seasonal and perennial allergic rhinitis 11/23/2007   CHRONIC RHINITIS 07/16/2007   GERD (gastroesophageal reflux disease) 05/29/2007   I B S-DIARRHEAL PREDOMINATE 05/29/2007   FATTY LIVER DISEASE 05/29/2007   Past Medical History:  Diagnosis Date   Acute and chronic respiratory failure with hypoxia (HCC)    Allergic rhinitis, cause unspecified    Sinus CT Rec 12-23-2009   Anxiety disorder, unspecified    Arthritis    Atrioventricular block, complete (HCC)    Benign paroxysmal positional vertigo 08/09/2012   Chronic airway obstruction, not elsewhere classified    HFA 75-90% after  coaching 12-23-2009   Chronic obstructive pulmonary disease, unspecified ([place])    Chronic rhinitis    Cognitive communication deficit    Complication of anesthesia    takes along time to wake up   Constipation, unspecified    COPD mixed type ([place])    Diabetes mellitus    Diarrhea    Diverticulosis    Dizziness and giddiness    Elevated troponin    Esophageal reflux    Esophageal stricture    Essential (primary) hypertension    Gastro-esophageal reflux disease without esophagitis    GERD (gastroesophageal reflux disease)    Glaucoma    Hiatal hernia    History of urinary tract infection    Hyperlipidemia, unspecified    Hypertension  Irritable bowel syndrome    Leukocytosis    Metabolic encephalopathy    Muscle weakness (generalized)    Neuropathy in diabetes ([place]) 08/09/2012   Obesity, unspecified    Oth speech/lang deficits following oth cerebvasc disease    Other abnormalities of gait and mobility    Other chronic nonalcoholic liver disease    Other forms of scoliosis, site unspecified    Other specified diseases of liver    Oxygen deficiency    Presence of permanent cardiac pacemaker 01/14/2020   Sciatica    Scoliosis    Shingles 2010   Shortness of breath    Spinal stenosis, lumbar region, without neurogenic claudication    Steatohepatitis    Thrombocytopenia, unspecified (HCC)    Type 2 diabetes mellitus with diabetic polyneuropathy ([place])    Type 2 diabetes mellitus with other diabetic neurological complication (HCC)    Unsteadiness on feet     Family History  Problem Relation Age of Onset   Heart disease Father    Lung cancer Mother        small cell;Byssinosis   Lung cancer Sister    Liver cancer Sister        ? mets from another area of the body   Diabetes Other        grandmother   Stroke Maternal Grandfather     Past Surgical History:  Procedure Laterality Date   APPENDECTOMY     CARPAL TUNNEL RELEASE     right hand   CHOLECYSTECTOMY OPEN  1978    COLONOSCOPY  07-2001   mild diverticulosis   ESOPHAGOGASTRODUODENOSCOPY  4585,92-92   H Hernia,es.stricture s/p dil 53F   INTRAOCULAR LENS INSERTION Bilateral    LIVER BIOPSY  08-4626   PACEMAKER IMPLANT N/A 01/14/2020   Procedure: PACEMAKER IMPLANT;  Surgeon: Evans Lance, MD;  Location: [facility];  Service: Cardiovascular;  Laterality: N/A;   PARATHYROID EXPLORATION     TONSILLECTOMY AND ADENOIDECTOMY     TOTAL ABDOMINAL HYSTERECTOMY     ULNAR NERVE TRANSPOSITION  12/07/2011   Procedure: ULNAR NERVE DECOMPRESSION/TRANSPOSITION;this was cancelled-not done  Surgeon: Cammie Sickle., MD;  Location: [facility];  Service: Orthopedics;  Laterality: Right;  right ulnar nerve in situ decompression   ULNAR TUNNEL RELEASE  03/07/2012   Procedure: CUBITAL TUNNEL RELEASE;  Surgeon: Roseanne Kaufman, MD;  Location: [facility];  Service: Orthopedics;  Laterality: Right;  ulnar nerve release at the elbow     Social History   Occupational History   Occupation: Retired     Comment: Training and development officer  Tobacco Use   Smoking status: Former    Types: Cigarettes    Quit date: 05/08/1988    Years since quitting: 33.0   Smokeless tobacco: Never  Vaping Use   Vaping Use: Never used  Substance and Sexual Activity   Alcohol use: No    Alcohol/week: 0.0 standard drinks   Drug use: No   Sexual activity: Not Currently

## 2021-05-27 ENCOUNTER — Telehealth: Payer: Self-pay | Admitting: Internal Medicine

## 2021-05-27 ENCOUNTER — Other Ambulatory Visit: Payer: Self-pay | Admitting: Internal Medicine

## 2021-05-27 NOTE — Telephone Encounter (Signed)
STAT if patient feels like he/she is going to faint   Are you dizzy now? Yes, dizzy all day. A jittery dizziness.   Do you feel faint or have you passed out? no  Do you have any other symptoms? No   Have you checked your HR and BP (record if available)? No, hasn't checked.

## 2021-05-27 NOTE — Telephone Encounter (Signed)
Returned call to Pt.  Pt states she is dizzy, but she states she is chronically dizzy.  She has not been checking her BP.  She is taking metoprolol succinate 50 mg PO DAILY instead of BID.  She will monitor her blood pressure daily for the next 3 days and this nurse will call Monday to get readings.

## 2021-05-31 ENCOUNTER — Ambulatory Visit (INDEPENDENT_AMBULATORY_CARE_PROVIDER_SITE_OTHER): Payer: Medicare Other | Admitting: Family Medicine

## 2021-05-31 ENCOUNTER — Other Ambulatory Visit: Payer: Self-pay

## 2021-05-31 ENCOUNTER — Encounter: Payer: Self-pay | Admitting: Family Medicine

## 2021-05-31 VITALS — BP 142/72 | HR 82 | Temp 97.1°F | Ht 63.0 in | Wt 155.8 lb

## 2021-05-31 DIAGNOSIS — J449 Chronic obstructive pulmonary disease, unspecified: Secondary | ICD-10-CM | POA: Diagnosis not present

## 2021-05-31 DIAGNOSIS — I442 Atrioventricular block, complete: Secondary | ICD-10-CM

## 2021-05-31 DIAGNOSIS — E1165 Type 2 diabetes mellitus with hyperglycemia: Secondary | ICD-10-CM | POA: Diagnosis not present

## 2021-05-31 DIAGNOSIS — H8110 Benign paroxysmal vertigo, unspecified ear: Secondary | ICD-10-CM | POA: Diagnosis not present

## 2021-05-31 DIAGNOSIS — E782 Mixed hyperlipidemia: Secondary | ICD-10-CM

## 2021-05-31 NOTE — Progress Notes (Signed)
Provider:  Alain Honey, MD  Careteam: Patient Care Team: Wardell Honour, MD as PCP - General (Family Medicine) Raynelle Bring, MD as Consulting Physician (Urology) Evans Lance, MD as Consulting Physician (Cardiology) Deneise Lever, MD as Consulting Physician (Pulmonary Disease) [facility] [name], MD as Consulting Physician (Endocrinology) Newt Minion, MD as Consulting Physician (Orthopedic Surgery) Raynelle Bring, MD as Consulting Physician (Urology)  PLACE OF SERVICE:  [place]  Advanced Directive information    Allergies  Allergen Reactions   Chlordiazepoxide-Clidinium Other (See Comments)    Sleepy,weak   5-Alpha Reductase Inhibitors    Biaxin [Clarithromycin] Other (See Comments)    Foul taste, abd pain, diarrhea   Tramadol Other (See Comments)    Caused tremors   Codeine Other (See Comments)    REACTION: gi upset- only in high doses per pt.  Can tolerate cough syrup with codeine.     Chief Complaint  Patient presents with   Medical Management of Chronic Issues    Patient presents today for a 3 month follow-up.    Quality Metric Gaps    DEXA scan, eye exam, urine microalbumin, zoster, TDAP, COVID booster     HPI: Patient is a 86 y.o. female says is a routine visit for medical management of chronic problems including diabetes mellitus, COPD, AV block, hyperlipidemia, and osteoarthritis.  Patient is followed now by cardiology to assess pacemaker function.  Her sugars are now and have always been elevated even when she was under the care of endocrinology. Today she is complaining more of dizziness, related more to extension of head. I explained that some of he sx are likely circulatory, related to tobacco, diabetes, and hyperlipidemia.  Review of Systems:  Review of Systems  Cardiovascular:  Positive for palpitations.  Neurological:  Positive for dizziness.  All other systems reviewed and are negative.  Past Medical  History:  Diagnosis Date   Acute and chronic respiratory failure with hypoxia (HCC)    Allergic rhinitis, cause unspecified    Sinus CT Rec 12-23-2009   Anxiety disorder, unspecified    Arthritis    Atrioventricular block, complete (HCC)    Benign paroxysmal positional vertigo 08/09/2012   Chronic airway obstruction, not elsewhere classified    HFA 75-90% after coaching 12-23-2009   Chronic obstructive pulmonary disease, unspecified ([place])    Chronic rhinitis    Cognitive communication deficit    Complication of anesthesia    takes along time to wake up   Constipation, unspecified    COPD mixed type ([place])    Diabetes mellitus    Diarrhea    Diverticulosis    Dizziness and giddiness    Elevated troponin    Esophageal reflux    Esophageal stricture    Essential (primary) hypertension    Gastro-esophageal reflux disease without esophagitis    GERD (gastroesophageal reflux disease)    Glaucoma    Hiatal hernia    History of urinary tract infection    Hyperlipidemia, unspecified    Hypertension    Irritable bowel syndrome    Leukocytosis    Metabolic encephalopathy    Muscle weakness (generalized)    Neuropathy in diabetes ([place]) 08/09/2012   Obesity, unspecified    Oth speech/lang deficits following oth cerebvasc disease    Other abnormalities of gait and mobility    Other chronic nonalcoholic liver disease    Other forms of scoliosis, site unspecified    Other specified diseases of liver  Oxygen deficiency    Presence of permanent cardiac pacemaker 01/14/2020   Sciatica    Scoliosis    Shingles 2010   Shortness of breath    Spinal stenosis, lumbar region, without neurogenic claudication    Steatohepatitis    Thrombocytopenia, unspecified (HCC)    Type 2 diabetes mellitus with diabetic polyneuropathy ([place])    Type 2 diabetes mellitus with other diabetic neurological complication ([place])    Unsteadiness on feet    Past Surgical History:  Procedure Laterality Date    APPENDECTOMY     CARPAL TUNNEL RELEASE     right hand   CHOLECYSTECTOMY OPEN  1978   COLONOSCOPY  07-2001   mild diverticulosis   ESOPHAGOGASTRODUODENOSCOPY  4270,62-37   H Hernia,es.stricture s/p dil 41F   INTRAOCULAR LENS INSERTION Bilateral    LIVER BIOPSY  10-2829   PACEMAKER IMPLANT N/A 01/14/2020   Procedure: PACEMAKER IMPLANT;  Surgeon: Evans Lance, MD;  Location: [facility];  Service: Cardiovascular;  Laterality: N/A;   PARATHYROID EXPLORATION     TONSILLECTOMY AND ADENOIDECTOMY     TOTAL ABDOMINAL HYSTERECTOMY     ULNAR NERVE TRANSPOSITION  12/07/2011   Procedure: ULNAR NERVE DECOMPRESSION/TRANSPOSITION;this was cancelled-not done  Surgeon: Cammie Sickle., MD;  Location: [facility];  Service: Orthopedics;  Laterality: Right;  right ulnar nerve in situ decompression   ULNAR TUNNEL RELEASE  03/07/2012   Procedure: CUBITAL TUNNEL RELEASE;  Surgeon: Roseanne Kaufman, MD;  Location: [place];  Service: Orthopedics;  Laterality: Right;  ulnar nerve release at the elbow     Social History:   reports that she quit smoking about 33 years ago. Her smoking use included cigarettes. She has never used smokeless tobacco. She reports that she does not drink alcohol and does not use drugs.  Family History  Problem Relation Age of Onset   Heart disease Father    Lung cancer Mother        small cell;Byssinosis   Lung cancer Sister    Liver cancer Sister        ? mets from another area of the body   Diabetes Other        grandmother   Stroke Maternal Grandfather     Medications: Patient's Medications  New Prescriptions   No medications on file  Previous Medications   ACETAMINOPHEN (TYLENOL) 325 MG TABLET    Take 2 tablets (650 mg total) by mouth every 6 (six) hours as needed for mild pain (or Fever >/= 101).   ALBUTEROL (VENTOLIN HFA) 108 (90 BASE) MCG/ACT INHALER    INHALE 2 PUFFS INTO THE LUNGS EVERY 6 HOURS AS NEEDED FOR WHEEZING OR  SHORTNESS OF BREATH.   ALPRAZOLAM (XANAX) 0.5 MG TABLET    Take 0.5 tablets (0.25 mg total) by mouth at bedtime as needed for up to 30 doses for sleep.   AMOXICILLIN (AMOXIL) 500 MG TABLET    Take 1 tablet (500 mg total) by mouth 2 (two) times daily.   ASPIRIN EC 81 MG TABLET    Take 1 tablet (81 mg total) by mouth daily. Swallow whole.   CHOLESTYRAMINE (QUESTRAN) 4 G PACKET    Take 1 packet (4 g total) by mouth 2 (two) times daily as needed. diarrhea   FUROSEMIDE (LASIX) 20 MG TABLET    Take 1 tablet (20 mg total) by mouth daily.   GLUCOSE BLOOD (ACCU-CHEK AVIVA PLUS) TEST STRIP    Use to test blood sugar  four times daily. Dx: E11.9   INSULIN ASPART (NOVOLOG FLEXPEN) 100 UNIT/ML FLEXPEN    INJECT 12-14 UNITS UNDER THE SKIN AT BREAKFAST, 14-16 UNITS AT LUNCH, AND 12-16 UNITS AT DINNER.   INSULIN DEGLUDEC (TRESIBA FLEXTOUCH) 200 UNIT/ML FLEXTOUCH PEN    Inject 36 Units into the skin at bedtime.   IPRATROPIUM-ALBUTEROL (DUONEB) 0.5-2.5 (3) MG/3ML SOLN    1 vial in neb every 8 hours and as needed   MAGIC MOUTHWASH SOLN    Swish with 2 teaspoonfuls by mouth as needed   METOPROLOL SUCCINATE (TOPROL-XL) 50 MG 24 HR TABLET    Take 50 mg by mouth 2 (two) times daily. Take with or immediately following a meal.   MOMETASONE (ELOCON) 0.1 % CREAM    Apply small amount bid to affected areas   NYSTATIN (MYCOSTATIN) 100000 UNIT/ML SUSPENSION       NYSTATIN CREAM (MYCOSTATIN)    Apply 1 application topically 2 (two) times daily. Apply to affected red areas on buttock   OMEPRAZOLE (PRILOSEC) 20 MG CAPSULE    TAKE (1) CAPSULE DAILY.   PRAVASTATIN (PRAVACHOL) 20 MG TABLET    Take 1 tablet (20 mg total) by mouth daily.   PREDNISONE (DELTASONE) 20 MG TABLET    Take 1 tablet (20 mg total) by mouth daily with breakfast.   SODIUM CHLORIDE HYPERTONIC 3 % NEBULIZER SOLUTION    1 ampule neb every 6 hours if needed to clear mucus   TIOTROPIUM BROMIDE-OLODATEROL (STIOLTO RESPIMAT) 2.5-2.5 MCG/ACT AERS    Inhale 2 puffs into  the lungs daily.  Modified Medications   No medications on file  Discontinued Medications   No medications on file    Physical Exam:  Vitals:   05/31/21 1015  BP: (!) 142/72  Pulse: 82  Temp: (!) 97.1 F (36.2 C)  SpO2: 96%  Weight: 155 lb 12.8 oz (70.7 kg)  Height: 5' 3"  (1.6 m)   Body mass index is 27.6 kg/m. Wt Readings from Last 3 Encounters:  05/31/21 155 lb 12.8 oz (70.7 kg)  04/25/21 153 lb (69.4 kg)  03/07/21 155 lb 12.8 oz (70.7 kg)    Physical Exam Vitals and nursing note reviewed.  Constitutional:      Appearance: Normal appearance.  Cardiovascular:     Rate and Rhythm: Normal rate and regular rhythm.  Pulmonary:     Effort: Pulmonary effort is normal.     Breath sounds: Normal breath sounds.  Neurological:     General: No focal deficit present.     Mental Status: She is alert and oriented to person, place, and time.  Psychiatric:        Mood and Affect: Mood normal.        Behavior: Behavior normal.    Labs reviewed: Basic Metabolic Panel: Recent Labs    06/07/20 1220 08/26/20 1023  NA 140 137  K 4.2 4.3  CL 98 94*  CO2 29 26  GLUCOSE 68 155*  BUN 9 13  CREATININE 0.63 0.74  CALCIUM 9.9 9.6   Liver Function Tests: Recent Labs    06/07/20 1220  AST 17  ALT 11  BILITOT 0.4  PROT 7.8   No results for input(s): LIPASE, AMYLASE in the last 8760 hours. No results for input(s): AMMONIA in the last 8760 hours. CBC: Recent Labs    06/07/20 1220 11/25/20 1224 03/03/21 1126  WBC 8.6 8.1 9.0  NEUTROABS 3,844 4,236 4,833  HGB 14.9 15.2 15.2  HCT 46.8* 47.8* 47.8*  MCV  84.2 85.8 86.0  PLT 198 174 161   Lipid Panel: Recent Labs    06/07/20 1220  CHOL 178  HDL 55  LDLCALC 101*  TRIG 122  CHOLHDL 3.2   TSH: No results for input(s): TSH in the last 8760 hours. A1C: Lab Results  Component Value Date   HGBA1C 11.6 (H) 02/22/2021     Assessment/Plan  1. Uncontrolled type 2 diabetes mellitus with hyperglycemia (HCC) A1C  always show poor control.  Even though this is not optimal I think at her age to try to achieve better control would create more problems and she is willing to undertake  2. Mixed hyperlipidemia Lipids last assessed 1 year ago and LDL, while not at goal under 70 is acceptable  3. Benign paroxysmal positional vertigo, unspecified laterality This has been a chronic problem.  I think it is primarily is circulatory but I have given her Epley exercises to try  4. Complete heart block ([place]) Followed by cardiology and has pacemaker and seems to be doing well from cardiac rhythm standpoint  5. COPD mixed type ([place]) Followed by pulmonary.  On several inhalers including albuterol TiTropium and ipratropium   Alain Honey, MD [place] [phone]

## 2021-05-31 NOTE — Telephone Encounter (Signed)
Levalbuterol refilled

## 2021-06-01 LAB — COMPLETE METABOLIC PANEL WITH GFR
AG Ratio: 1.5 (calc) (ref 1.0–2.5)
ALT: 16 U/L (ref 6–29)
AST: 16 U/L (ref 10–35)
Albumin: 4.4 g/dL (ref 3.6–5.1)
Alkaline phosphatase (APISO): 45 U/L (ref 37–153)
BUN: 12 mg/dL (ref 7–25)
CO2: 39 mmol/L — ABNORMAL HIGH (ref 20–32)
Calcium: 9.9 mg/dL (ref 8.6–10.4)
Chloride: 94 mmol/L — ABNORMAL LOW (ref 98–110)
Creat: 0.75 mg/dL (ref 0.60–0.95)
Globulin: 2.9 g/dL (calc) (ref 1.9–3.7)
Glucose, Bld: 326 mg/dL — ABNORMAL HIGH (ref 65–99)
Potassium: 4.6 mmol/L (ref 3.5–5.3)
Sodium: 136 mmol/L (ref 135–146)
Total Bilirubin: 0.6 mg/dL (ref 0.2–1.2)
Total Protein: 7.3 g/dL (ref 6.1–8.1)
eGFR: 79 mL/min/{1.73_m2} (ref >=60)

## 2021-06-01 LAB — LIPID PANEL
Cholesterol: 125 mg/dL (ref <200)
HDL: 55 mg/dL (ref >=50)
LDL Cholesterol (Calc): 48 mg/dL (calc)
Non-HDL Cholesterol (Calc): 70 mg/dL (calc) (ref <130)
Total CHOL/HDL Ratio: 2.3 (calc) (ref <5.0)
Triglycerides: 135 mg/dL (ref <150)

## 2021-06-01 LAB — HEMOGLOBIN A1C
Hgb A1c MFr Bld: 11.6 % of total Hgb — ABNORMAL HIGH (ref <5.7)
Mean Plasma Glucose: 286 mg/dL
eAG (mmol/L): 15.9 mmol/L

## 2021-06-01 NOTE — Telephone Encounter (Signed)
Returned call to Pt.  She reports blood pressures in the 180's over 40-60's this last weekend.  She saw her PCP recently and systolic was measured at 488 per Pt.  Advised to take your toprol BID as recommended by Dr. Lovena Le and continue to monitor her BP.  Advised to call if she notices her BP going to low.  Pt is discussing continued dizziness with her PCP.  Await further needs.

## 2021-06-16 ENCOUNTER — Other Ambulatory Visit: Payer: Self-pay

## 2021-06-16 ENCOUNTER — Ambulatory Visit (INDEPENDENT_AMBULATORY_CARE_PROVIDER_SITE_OTHER): Payer: Medicare Other | Admitting: Family

## 2021-06-16 ENCOUNTER — Encounter: Payer: Self-pay | Admitting: Family

## 2021-06-16 VITALS — BP 132/60 | HR 100 | Temp 97.5°F | Resp 16 | Ht 63.0 in | Wt 150.2 lb

## 2021-06-16 DIAGNOSIS — R350 Frequency of micturition: Secondary | ICD-10-CM | POA: Diagnosis not present

## 2021-06-16 DIAGNOSIS — R300 Dysuria: Secondary | ICD-10-CM | POA: Diagnosis not present

## 2021-06-16 LAB — POCT URINALYSIS DIPSTICK
Bilirubin, UA: NEGATIVE
Glucose, UA: POSITIVE — AB
Ketones, UA: NEGATIVE
Nitrite, UA: POSITIVE
Odor: NORMAL
Protein, UA: NEGATIVE
Spec Grav, UA: 1.015 (ref 1.010–1.025)
Urobilinogen, UA: 0.2 E.U./dL
pH, UA: 5 (ref 5.0–8.0)

## 2021-06-16 MED ORDER — CRANBERRY 475 MG PO CAPS: 475.0000 mg | ORAL_CAPSULE | Freq: Two times a day (BID) | ORAL | 3 refills | Status: DC

## 2021-06-16 NOTE — Patient Instructions (Signed)
Urinary Tract Infection, Adult A urinary tract infection (UTI) is an infection of any part of the urinary tract. The urinary tract includes the kidneys, ureters, bladder, and urethra. These organs make, store, and get rid of urine in the body. An upper UTI affects the ureters and kidneys. A lower UTI affects the bladder and urethra. What are the causes? Most urinary tract infections are caused by bacteria in your genital area around your urethra, where urine leaves your body. These bacteria grow and cause inflammation of your urinary tract. What increases the risk? You are more likely to develop this condition if: You have a urinary catheter that stays in place. You are not able to control when you urinate or have a bowel movement (incontinence). You are female and you: Use a spermicide or diaphragm for birth control. Have low estrogen levels. Are pregnant. You have certain genes that increase your risk. You are sexually active. You take antibiotic medicines. You have a condition that causes your flow of urine to slow down, such as: An enlarged prostate, if you are female. Blockage in your urethra. A kidney stone. A nerve condition that affects your bladder control (neurogenic bladder). Not getting enough to drink, or not urinating often. You have certain medical conditions, such as: Diabetes. A weak disease-fighting system (immunesystem). Sickle cell disease. Gout. Spinal cord injury. What are the signs or symptoms? Symptoms of this condition include: Needing to urinate right away (urgency). Frequent urination. This may include small amounts of urine each time you urinate. Pain or burning with urination. Blood in the urine. Urine that smells bad or unusual. Trouble urinating. Cloudy urine. Vaginal discharge, if you are female. Pain in the abdomen or the lower back. You may also have: Vomiting or a decreased appetite. Confusion. Irritability or tiredness. A fever or  chills. Diarrhea. The first symptom in older adults may be confusion. In some cases, they may not have any symptoms until the infection has worsened. How is this diagnosed? This condition is diagnosed based on your medical history and a physical exam. You may also have other tests, including: Urine tests. Blood tests. Tests for STIs (sexually transmitted infections). If you have had more than one UTI, a cystoscopy or imaging studies may be done to determine the cause of the infections. How is this treated? Treatment for this condition includes: Antibiotic medicine. Over-the-counter medicines to treat discomfort. Drinking enough water to stay hydrated. If you have frequent infections or have other conditions such as a kidney stone, you may need to see a health care provider who specializes in the urinary tract (urologist). In rare cases, urinary tract infections can cause sepsis. Sepsis is a life-threatening condition that occurs when the body responds to an infection. Sepsis is treated in the hospital with IV antibiotics, fluids, and other medicines. Follow these instructions at home: Medicines Take over-the-counter and prescription medicines only as told by your health care provider. If you were prescribed an antibiotic medicine, take it as told by your health care provider. Do not stop using the antibiotic even if you start to feel better. General instructions Make sure you: Empty your bladder often and completely. Do not hold urine for long periods of time. Empty your bladder after sex. Wipe from front to back after urinating or having a bowel movement if you are female. Use each tissue only one time when you wipe. Drink enough fluid to keep your urine pale yellow. Keep all follow-up visits. This is important. Contact a health care provider  if: Your symptoms do not get better after 1-2 days. Your symptoms go away and then return. Get help right away if: You have severe pain in your  back or your lower abdomen. You have a fever or chills. You have nausea or vomiting. Summary A urinary tract infection (UTI) is an infection of any part of the urinary tract, which includes the kidneys, ureters, bladder, and urethra. Most urinary tract infections are caused by bacteria in your genital area. Treatment for this condition often includes antibiotic medicines. If you were prescribed an antibiotic medicine, take it as told by your health care provider. Do not stop using the antibiotic even if you start to feel better. Keep all follow-up visits. This is important. This information is not intended to replace advice given to you by your health care provider. Make sure you discuss any questions you have with your health care provider. Document Revised: 12/05/2019 Document Reviewed: 12/05/2019 Elsevier Patient Education  [place].

## 2021-06-16 NOTE — Progress Notes (Signed)
Provider: Charonda Hefter FNP-C  Wardell Honour, MD  Patient Care Team: Wardell Honour, MD as PCP - General (Family Medicine) Raynelle Bring, MD as Consulting Physician (Urology) Evans Lance, MD as Consulting Physician (Cardiology) Deneise Lever, MD as Consulting Physician (Pulmonary Disease) [facility] [name], MD as Consulting Physician (Endocrinology) Newt Minion, MD as Consulting Physician (Orthopedic Surgery) Raynelle Bring, MD as Consulting Physician (Urology)  No emergency contact information on file.  Code Status:  Full Code  Goals of care: Advanced Directive information Advanced Directives 06/16/2021  Does Patient Have a Medical Advance Directive? No  Type of Advance Directive -  Does patient want to make changes to medical advance directive? -  Copy of Home in Chart? -  Would patient like information on creating a medical advance directive? No - Patient declined  Pre-existing out of facility DNR order (yellow form or pink MOST form) -     Chief Complaint  Patient presents with   Acute Visit    Patient complains of urinary frequency, and dysuria x 1 week.     HPI:  Pt is a 86 y.o. female seen today for an acute visit for evaluation of dysuria and urine frequency x 1 week. denies any fever,chills,nausea,vomiting,abdominal pain,flank pain,urgency,difficult urination or hematuria.states tries to drink water throughout the day.Has tendencies for cystitis.Has not had one in a long time.    Past Medical History:  Diagnosis Date   Acute and chronic respiratory failure with hypoxia (HCC)    Allergic rhinitis, cause unspecified    Sinus CT Rec 12-23-2009   Anxiety disorder, unspecified    Arthritis    Atrioventricular block, complete (HCC)    Benign paroxysmal positional vertigo 08/09/2012   Chronic airway obstruction, not elsewhere classified    HFA 75-90% after coaching 12-23-2009   Chronic obstructive  pulmonary disease, unspecified ([place])    Chronic rhinitis    Cognitive communication deficit    Complication of anesthesia    takes along time to wake up   Constipation, unspecified    COPD mixed type ([place])    Diabetes mellitus    Diarrhea    Diverticulosis    Dizziness and giddiness    Elevated troponin    Esophageal reflux    Esophageal stricture    Essential (primary) hypertension    Gastro-esophageal reflux disease without esophagitis    GERD (gastroesophageal reflux disease)    Glaucoma    Hiatal hernia    History of urinary tract infection    Hyperlipidemia, unspecified    Hypertension    Irritable bowel syndrome    Leukocytosis    Metabolic encephalopathy    Muscle weakness (generalized)    Neuropathy in diabetes ([place]) 08/09/2012   Obesity, unspecified    Oth speech/lang deficits following oth cerebvasc disease    Other abnormalities of gait and mobility    Other chronic nonalcoholic liver disease    Other forms of scoliosis, site unspecified    Other specified diseases of liver    Oxygen deficiency    Presence of permanent cardiac pacemaker 01/14/2020   Sciatica    Scoliosis    Shingles 2010   Shortness of breath    Spinal stenosis, lumbar region, without neurogenic claudication    Steatohepatitis    Thrombocytopenia, unspecified (HCC)    Type 2 diabetes mellitus with diabetic polyneuropathy ([place])    Type 2 diabetes mellitus with other diabetic neurological complication (HCC)    Unsteadiness  on feet    Past Surgical History:  Procedure Laterality Date   APPENDECTOMY     CARPAL TUNNEL RELEASE     right hand   CHOLECYSTECTOMY OPEN  1978   COLONOSCOPY  07-2001   mild diverticulosis   ESOPHAGOGASTRODUODENOSCOPY  1505,69-79   H Hernia,es.stricture s/p dil 50F   INTRAOCULAR LENS INSERTION Bilateral    LIVER BIOPSY  08-8014   PACEMAKER IMPLANT N/A 01/14/2020   Procedure: PACEMAKER IMPLANT;  Surgeon: Evans Lance, MD;  Location: [facility];  Service:  Cardiovascular;  Laterality: N/A;   PARATHYROID EXPLORATION     TONSILLECTOMY AND ADENOIDECTOMY     TOTAL ABDOMINAL HYSTERECTOMY     ULNAR NERVE TRANSPOSITION  12/07/2011   Procedure: ULNAR NERVE DECOMPRESSION/TRANSPOSITION;this was cancelled-not done  Surgeon: Cammie Sickle., MD;  Location: [facility];  Service: Orthopedics;  Laterality: Right;  right ulnar nerve in situ decompression   ULNAR TUNNEL RELEASE  03/07/2012   Procedure: CUBITAL TUNNEL RELEASE;  Surgeon: Roseanne Kaufman, MD;  Location: [place];  Service: Orthopedics;  Laterality: Right;  ulnar nerve release at the elbow      Allergies  Allergen Reactions   Chlordiazepoxide-Clidinium Other (See Comments)    Sleepy,weak   5-Alpha Reductase Inhibitors    Biaxin [Clarithromycin] Other (See Comments)    Foul taste, abd pain, diarrhea   Tramadol Other (See Comments)    Caused tremors   Codeine Other (See Comments)    REACTION: gi upset- only in high doses per pt.  Can tolerate cough syrup with codeine.     Outpatient Encounter Medications as of 06/16/2021  Medication Sig   acetaminophen (TYLENOL) 325 MG tablet Take 2 tablets (650 mg total) by mouth every 6 (six) hours as needed for mild pain (or Fever >/= 101).   albuterol (VENTOLIN HFA) 108 (90 Base) MCG/ACT inhaler INHALE 2 PUFFS INTO THE LUNGS EVERY 6 HOURS AS NEEDED FOR WHEEZING OR SHORTNESS OF BREATH.   ALPRAZolam (XANAX) 0.5 MG tablet Take 0.5 tablets (0.25 mg total) by mouth at bedtime as needed for up to 30 doses for sleep.   amoxicillin (AMOXIL) 500 MG tablet Take 1 tablet (500 mg total) by mouth 2 (two) times daily.   aspirin EC 81 MG tablet Take 1 tablet (81 mg total) by mouth daily. Swallow whole.   cholestyramine (QUESTRAN) 4 g packet Take 1 packet (4 g total) by mouth 2 (two) times daily as needed. diarrhea   glucose blood (ACCU-CHEK AVIVA PLUS) test strip Use to test blood sugar four times daily. Dx: E11.9   insulin aspart  (NOVOLOG FLEXPEN) 100 UNIT/ML FlexPen INJECT 12-14 UNITS UNDER THE SKIN AT BREAKFAST, 14-16 UNITS AT LUNCH, AND 12-16 UNITS AT DINNER.   insulin degludec (TRESIBA FLEXTOUCH) 200 UNIT/ML FlexTouch Pen Inject 36 Units into the skin at bedtime.   ipratropium-albuterol (DUONEB) 0.5-2.5 (3) MG/3ML SOLN 1 vial in neb every 8 hours and as needed   levalbuterol (XOPENEX HFA) 45 MCG/ACT inhaler INHALE 1-2 PUFFS EVERY 6 HOURS IF NEEDED FOR WHEEZING.   magic mouthwash SOLN Swish with 2 teaspoonfuls by mouth as needed   metoprolol succinate (TOPROL-XL) 50 MG 24 hr tablet Take 50 mg by mouth 2 (two) times daily. Take with or immediately following a meal.   mometasone (ELOCON) 0.1 % cream Apply small amount bid to affected areas   nystatin (MYCOSTATIN) 100000 UNIT/ML suspension    nystatin cream (MYCOSTATIN) Apply 1 application topically 2 (two) times daily.  Apply to affected red areas on buttock   omeprazole (PRILOSEC) 20 MG capsule TAKE (1) CAPSULE DAILY.   pravastatin (PRAVACHOL) 20 MG tablet Take 1 tablet (20 mg total) by mouth daily.   predniSONE (DELTASONE) 20 MG tablet Take 1 tablet (20 mg total) by mouth daily with breakfast.   sodium chloride HYPERTONIC 3 % nebulizer solution 1 ampule neb every 6 hours if needed to clear mucus   Tiotropium Bromide-Olodaterol (STIOLTO RESPIMAT) 2.5-2.5 MCG/ACT AERS Inhale 2 puffs into the lungs daily.   furosemide (LASIX) 20 MG tablet Take 1 tablet (20 mg total) by mouth daily.   No facility-administered encounter medications on file as of 06/16/2021.    Review of Systems  Constitutional:  Negative for appetite change, chills, fatigue, fever and unexpected weight change.  Respiratory:  Negative for cough, chest tightness, shortness of breath and wheezing.   Cardiovascular:  Negative for chest pain, palpitations and leg swelling.  Gastrointestinal:  Negative for abdominal distention, abdominal pain, blood in stool, constipation, diarrhea, nausea and vomiting.   Genitourinary:  Positive for dysuria and frequency. Negative for difficulty urinating, flank pain and urgency.  Musculoskeletal:  Positive for arthralgias and back pain. Negative for gait problem, joint swelling, myalgias, neck pain and neck stiffness.  Psychiatric/Behavioral:  Negative for agitation, behavioral problems, confusion, hallucinations and sleep disturbance. The patient is not nervous/anxious.    Immunization History  Administered Date(s) Administered   Fluad Quad(high Dose 65+) 01/30/2019, 03/01/2020, 02/08/2021   Influenza Split 01/07/2011, 02/22/2012   Influenza Whole 05/09/2007, 02/05/2010, 01/22/2017   Influenza, High Dose Seasonal PF 02/06/2018   Influenza,inj,Quad PF,6+ Mos 01/21/2013, 03/18/2014, 01/08/2015, 01/13/2016   Janssen (J&J) SARS-COV-2 Vaccination 01/23/2020   Moderna SARS-COV2 Booster Vaccination 05/26/2020, 11/15/2020   Pneumococcal Conjugate-13 11/24/2013   Pneumococcal Polysaccharide-23 04/23/2012   Pertinent  Health Maintenance Due  Topic Date Due   DEXA SCAN  Never done   URINE MICROALBUMIN  05/11/2018   OPHTHALMOLOGY EXAM  12/28/2018   FOOT EXAM  09/01/2021   HEMOGLOBIN A1C  11/28/2021   INFLUENZA VACCINE  Completed   Fall Risk 03/03/2021 03/04/2021 03/07/2021 05/31/2021 06/16/2021  Falls in the past year? 0 0 0 0 0  Was there an injury with Fall? 0 0 0 0 0  Fall Risk Category Calculator 0 0 0 0 0  Fall Risk Category Low Low Low Low Low  Patient Fall Risk Level Low fall risk Low fall risk Low fall risk Low fall risk Low fall risk  Patient at Risk for Falls Due to No Fall Risks No Fall Risks No Fall Risks History of fall(s) No Fall Risks  Fall risk Follow up Falls evaluation completed Falls evaluation completed Falls evaluation completed Falls evaluation completed;Education provided;Falls prevention discussed Falls evaluation completed   Functional Status Survey:    Vitals:   06/16/21 1137  BP: 132/60  Pulse: 100  Resp: 16  Temp: (!) 97.5  F (36.4 C)  SpO2: 93%  Weight: 150 lb 3.2 oz (68.1 kg)  Height: 5' 3"  (1.6 m)   Body mass index is 26.61 kg/m. Physical Exam Vitals reviewed.  Constitutional:      General: She is not in acute distress.    Appearance: Normal appearance. She is normal weight. She is not ill-appearing or diaphoretic.  HENT:     Mouth/Throat:     Mouth: Mucous membranes are moist.     Pharynx: Oropharynx is clear. No oropharyngeal exudate or posterior oropharyngeal erythema.  Eyes:     General: No scleral  icterus.       Right eye: No discharge.        Left eye: No discharge.     Conjunctiva/sclera: Conjunctivae normal.     Pupils: Pupils are equal, round, and reactive to light.  Cardiovascular:     Rate and Rhythm: Normal rate and regular rhythm.     Pulses: Normal pulses.     Heart sounds: Normal heart sounds. No murmur heard.   No friction rub. No gallop.  Pulmonary:     Effort: Pulmonary effort is normal. No respiratory distress.     Breath sounds: Normal breath sounds. No wheezing, rhonchi or rales.     Comments: Oxygen via Nasal canula in place  Chest:     Chest wall: No tenderness.  Abdominal:     General: Bowel sounds are normal. There is no distension.     Palpations: Abdomen is soft. There is no mass.     Tenderness: There is no abdominal tenderness. There is no right CVA tenderness, left CVA tenderness, guarding or rebound.  Neurological:     Mental Status: She is alert and oriented to person, place, and time.     Motor: No weakness.     Gait: Gait abnormal.  Psychiatric:        Mood and Affect: Mood normal.        Speech: Speech normal.        Behavior: Behavior normal.        Thought Content: Thought content normal.        Judgment: Judgment normal.    Labs reviewed: Recent Labs    08/26/20 1023 05/31/21 1119  NA 137 136  K 4.3 4.6  CL 94* 94*  CO2 26 39*  GLUCOSE 155* 326*  BUN 13 12  CREATININE 0.74 0.75  CALCIUM 9.6 9.9   Recent Labs    05/31/21 1119   AST 16  ALT 16  BILITOT 0.6  PROT 7.3   Recent Labs    11/25/20 1224 03/03/21 1126  WBC 8.1 9.0  NEUTROABS 4,236 4,833  HGB 15.2 15.2  HCT 47.8* 47.8*  MCV 85.8 86.0  PLT 174 161   Lab Results  Component Value Date   TSH 1.970 01/13/2020   Lab Results  Component Value Date   HGBA1C 11.6 (H) 05/31/2021   Lab Results  Component Value Date   CHOL 125 05/31/2021   HDL 55 05/31/2021   LDLCALC 48 05/31/2021   LDLDIRECT 89.0 10/14/2014   TRIG 135 05/31/2021   CHOLHDL 2.3 05/31/2021    Significant Diagnostic Results in last 30 days:  No results found.  Assessment/Plan  1. Dysuria Afebrile  Negative exam findings  - POC Urinalysis Dipstick. indicates yellow clear urine positive for moderate blood,Nitrites and moderate Leukocytes 2+ - Urine Culture - Cranberry 475 MG CAPS; Take 1 capsule (475 mg total) by mouth 2 (two) times daily.  Dispense: 60 capsule; Refill: 3  2. Urinary frequency Increase water intake  Will send urine for culture - POC Urinalysis Dipstick - Ur ine Culture  Family/ staff Communication: Reviewed plan of care with patient verbalized understanding   Labs/tests ordered:  - POC Urinalysis Dipstick - Ur ine Culture  Next Appointment: As needed if symptoms worsen or fail to improve    Sandrea Hughs, NP

## 2021-06-18 LAB — URINE CULTURE
MICRO NUMBER:: 12987828
SPECIMEN QUALITY:: ADEQUATE

## 2021-06-22 DIAGNOSIS — H15101 Unspecified episcleritis, right eye: Secondary | ICD-10-CM | POA: Diagnosis not present

## 2021-06-24 ENCOUNTER — Telehealth: Payer: Self-pay

## 2021-06-24 MED ORDER — CIPROFLOXACIN HCL 500 MG PO TABS: 500.0000 mg | ORAL_TABLET | Freq: Two times a day (BID) | ORAL | 0 refills | Status: AC

## 2021-06-24 MED ORDER — SACCHAROMYCES BOULARDII 250 MG PO CAPS: 250.0000 mg | ORAL_CAPSULE | Freq: Two times a day (BID) | ORAL | 0 refills | Status: AC

## 2021-06-24 NOTE — Telephone Encounter (Addendum)
-----   Message from Sandrea Hughs, NP sent at 06/24/2021 12:10 PM EST ----- Final urine culture indicates > 100,000 colonies of Klebsiella bacteria.Start on Cipro 500 mg tablet one by mouth twice daily x 7 days Take along with probiotics Florastor 250 mg capsule one by mouth twice daily x 10 days to prevent antibiotics associated diarrhea   DWP. Rxs sent to [place].

## 2021-07-13 ENCOUNTER — Ambulatory Visit (INDEPENDENT_AMBULATORY_CARE_PROVIDER_SITE_OTHER): Payer: Medicare Other

## 2021-07-13 DIAGNOSIS — I442 Atrioventricular block, complete: Secondary | ICD-10-CM

## 2021-07-13 LAB — CUP PACEART REMOTE DEVICE CHECK
Battery Remaining Longevity: 109 mo
Battery Remaining Percentage: 88 %
Battery Voltage: 3.02 V
Brady Statistic AP VP Percent: 1.3 %
Brady Statistic AP VS Percent: 1 %
Brady Statistic AS VP Percent: 99 %
Brady Statistic AS VS Percent: 1 %
Brady Statistic RA Percent Paced: 1.1 %
Brady Statistic RV Percent Paced: 99 %
Date Time Interrogation Session: 20230308024514
Implantable Lead Implant Date: 20210908
Implantable Lead Implant Date: 20210908
Implantable Lead Location: 753859
Implantable Lead Location: 753860
Implantable Pulse Generator Implant Date: 20210908
Lead Channel Impedance Value: 580 Ohm
Lead Channel Impedance Value: 580 Ohm
Lead Channel Pacing Threshold Amplitude: 0.5 V
Lead Channel Pacing Threshold Amplitude: 0.875 V
Lead Channel Pacing Threshold Pulse Width: 0.4 ms
Lead Channel Pacing Threshold Pulse Width: 0.5 ms
Lead Channel Sensing Intrinsic Amplitude: 12 mV
Lead Channel Sensing Intrinsic Amplitude: 2.3 mV
Lead Channel Setting Pacing Amplitude: 1.125
Lead Channel Setting Pacing Amplitude: 2 V
Lead Channel Setting Pacing Pulse Width: 0.5 ms
Lead Channel Setting Sensing Sensitivity: 2 mV
Pulse Gen Model: 2272
Pulse Gen Serial Number: 3856705

## 2021-07-20 ENCOUNTER — Telehealth: Payer: Self-pay | Admitting: *Deleted

## 2021-07-20 MED ORDER — CIPROFLOXACIN HCL 250 MG PO TABS: 250.0000 mg | ORAL_TABLET | Freq: Two times a day (BID) | ORAL | 0 refills | Status: DC

## 2021-07-20 NOTE — Telephone Encounter (Signed)
Patient called and stated that she is urinating all over and can't help it. Stated that it is from her cystitis. Stated that she saw Dinah on 2/9 and given antibiotics, which she completed.  ? ?Stated that she is taking the Cranberry as recommended but still urinating. Stated that she is having some burning now when she urinates.  ? ?Patient does have a Dealer but believes that Dr. Sabra Heck will have the answer for this. Wonders if you can place her on something to help with these symptoms.  ? ?Please Advise.  ?

## 2021-07-20 NOTE — Telephone Encounter (Signed)
Wardell Honour, MD  You 1 hour ago (11:59 AM)  ? ?Lets try cipro 250 mg BID  #6   ? ? ? ? ?Patient notified and agreed. Rx sent to pharmacy.  ?

## 2021-07-27 NOTE — Progress Notes (Signed)
Remote pacemaker transmission.   

## 2021-08-02 DIAGNOSIS — H15101 Unspecified episcleritis, right eye: Secondary | ICD-10-CM | POA: Diagnosis not present

## 2021-08-02 DIAGNOSIS — H10403 Unspecified chronic conjunctivitis, bilateral: Secondary | ICD-10-CM | POA: Diagnosis not present

## 2021-08-02 DIAGNOSIS — H04123 Dry eye syndrome of bilateral lacrimal glands: Secondary | ICD-10-CM | POA: Diagnosis not present

## 2021-08-03 ENCOUNTER — Telehealth: Payer: Self-pay | Admitting: *Deleted

## 2021-08-03 NOTE — Telephone Encounter (Signed)
Patient called and stated that she is still Urinating all over herself. Stated that it is happening all the time.  ?Patient is wanting to know what to do to control this. No other symptoms noted.  ? ?Please Advise.  ?

## 2021-08-09 ENCOUNTER — Observation Stay (HOSPITAL_BASED_OUTPATIENT_CLINIC_OR_DEPARTMENT_OTHER): Payer: Medicare Other

## 2021-08-09 ENCOUNTER — Emergency Department (HOSPITAL_COMMUNITY): Payer: Medicare Other

## 2021-08-09 ENCOUNTER — Encounter (HOSPITAL_COMMUNITY): Payer: Self-pay

## 2021-08-09 ENCOUNTER — Other Ambulatory Visit: Payer: Self-pay

## 2021-08-09 ENCOUNTER — Inpatient Hospital Stay (HOSPITAL_COMMUNITY)
Admission: EM | Admit: 2021-08-09 | Discharge: 2021-08-13 | DRG: 064 | Disposition: A | Discharge status: Home Health | Payer: Medicare Other | Attending: Internal Medicine | Admitting: Internal Medicine

## 2021-08-09 DIAGNOSIS — R410 Disorientation, unspecified: Secondary | ICD-10-CM | POA: Diagnosis not present

## 2021-08-09 DIAGNOSIS — H409 Unspecified glaucoma: Secondary | ICD-10-CM | POA: Diagnosis present

## 2021-08-09 DIAGNOSIS — R29818 Other symptoms and signs involving the nervous system: Secondary | ICD-10-CM | POA: Diagnosis not present

## 2021-08-09 DIAGNOSIS — Z8619 Personal history of other infectious and parasitic diseases: Secondary | ICD-10-CM

## 2021-08-09 DIAGNOSIS — J9611 Chronic respiratory failure with hypoxia: Secondary | ICD-10-CM | POA: Diagnosis present

## 2021-08-09 DIAGNOSIS — R29810 Facial weakness: Secondary | ICD-10-CM | POA: Diagnosis present

## 2021-08-09 DIAGNOSIS — K7581 Nonalcoholic steatohepatitis (NASH): Secondary | ICD-10-CM | POA: Diagnosis present

## 2021-08-09 DIAGNOSIS — K219 Gastro-esophageal reflux disease without esophagitis: Secondary | ICD-10-CM | POA: Diagnosis present

## 2021-08-09 DIAGNOSIS — E1149 Type 2 diabetes mellitus with other diabetic neurological complication: Secondary | ICD-10-CM | POA: Diagnosis present

## 2021-08-09 DIAGNOSIS — E1165 Type 2 diabetes mellitus with hyperglycemia: Secondary | ICD-10-CM | POA: Diagnosis present

## 2021-08-09 DIAGNOSIS — G3184 Mild cognitive impairment, so stated: Secondary | ICD-10-CM | POA: Diagnosis present

## 2021-08-09 DIAGNOSIS — M419 Scoliosis, unspecified: Secondary | ICD-10-CM | POA: Diagnosis present

## 2021-08-09 DIAGNOSIS — R0902 Hypoxemia: Secondary | ICD-10-CM | POA: Diagnosis not present

## 2021-08-09 DIAGNOSIS — R29706 NIHSS score 6: Secondary | ICD-10-CM | POA: Diagnosis not present

## 2021-08-09 DIAGNOSIS — N390 Urinary tract infection, site not specified: Secondary | ICD-10-CM | POA: Diagnosis not present

## 2021-08-09 DIAGNOSIS — Z950 Presence of cardiac pacemaker: Secondary | ICD-10-CM | POA: Diagnosis not present

## 2021-08-09 DIAGNOSIS — I63233 Cerebral infarction due to unspecified occlusion or stenosis of bilateral carotid arteries: Secondary | ICD-10-CM | POA: Diagnosis not present

## 2021-08-09 DIAGNOSIS — Z7982 Long term (current) use of aspirin: Secondary | ICD-10-CM

## 2021-08-09 DIAGNOSIS — I442 Atrioventricular block, complete: Secondary | ICD-10-CM | POA: Diagnosis present

## 2021-08-09 DIAGNOSIS — G4489 Other headache syndrome: Secondary | ICD-10-CM | POA: Diagnosis not present

## 2021-08-09 DIAGNOSIS — R4781 Slurred speech: Secondary | ICD-10-CM | POA: Diagnosis not present

## 2021-08-09 DIAGNOSIS — M3500 Sicca syndrome, unspecified: Secondary | ICD-10-CM | POA: Diagnosis present

## 2021-08-09 DIAGNOSIS — Z885 Allergy status to narcotic agent status: Secondary | ICD-10-CM

## 2021-08-09 DIAGNOSIS — I639 Cerebral infarction, unspecified: Secondary | ICD-10-CM | POA: Diagnosis not present

## 2021-08-09 DIAGNOSIS — G459 Transient cerebral ischemic attack, unspecified: Secondary | ICD-10-CM

## 2021-08-09 DIAGNOSIS — E1142 Type 2 diabetes mellitus with diabetic polyneuropathy: Secondary | ICD-10-CM | POA: Diagnosis present

## 2021-08-09 DIAGNOSIS — K589 Irritable bowel syndrome without diarrhea: Secondary | ICD-10-CM | POA: Diagnosis present

## 2021-08-09 DIAGNOSIS — M1990 Unspecified osteoarthritis, unspecified site: Secondary | ICD-10-CM | POA: Diagnosis present

## 2021-08-09 DIAGNOSIS — J449 Chronic obstructive pulmonary disease, unspecified: Secondary | ICD-10-CM | POA: Diagnosis present

## 2021-08-09 DIAGNOSIS — R54 Age-related physical debility: Secondary | ICD-10-CM | POA: Diagnosis present

## 2021-08-09 DIAGNOSIS — E785 Hyperlipidemia, unspecified: Secondary | ICD-10-CM | POA: Diagnosis present

## 2021-08-09 DIAGNOSIS — Z87440 Personal history of urinary (tract) infections: Secondary | ICD-10-CM

## 2021-08-09 DIAGNOSIS — Z20822 Contact with and (suspected) exposure to covid-19: Secondary | ICD-10-CM | POA: Diagnosis not present

## 2021-08-09 DIAGNOSIS — R4182 Altered mental status, unspecified: Secondary | ICD-10-CM | POA: Diagnosis not present

## 2021-08-09 DIAGNOSIS — R2990 Unspecified symptoms and signs involving the nervous system: Principal | ICD-10-CM

## 2021-08-09 DIAGNOSIS — M48061 Spinal stenosis, lumbar region without neurogenic claudication: Secondary | ICD-10-CM | POA: Diagnosis present

## 2021-08-09 DIAGNOSIS — Z90710 Acquired absence of both cervix and uterus: Secondary | ICD-10-CM

## 2021-08-09 DIAGNOSIS — R4701 Aphasia: Secondary | ICD-10-CM | POA: Diagnosis not present

## 2021-08-09 DIAGNOSIS — F0394 Unspecified dementia, unspecified severity, with anxiety: Secondary | ICD-10-CM | POA: Diagnosis present

## 2021-08-09 DIAGNOSIS — Z9981 Dependence on supplemental oxygen: Secondary | ICD-10-CM

## 2021-08-09 DIAGNOSIS — Z833 Family history of diabetes mellitus: Secondary | ICD-10-CM

## 2021-08-09 DIAGNOSIS — Z8249 Family history of ischemic heart disease and other diseases of the circulatory system: Secondary | ICD-10-CM

## 2021-08-09 DIAGNOSIS — E119 Type 2 diabetes mellitus without complications: Secondary | ICD-10-CM

## 2021-08-09 DIAGNOSIS — G9341 Metabolic encephalopathy: Secondary | ICD-10-CM | POA: Diagnosis present

## 2021-08-09 DIAGNOSIS — J439 Emphysema, unspecified: Secondary | ICD-10-CM | POA: Diagnosis present

## 2021-08-09 DIAGNOSIS — R9431 Abnormal electrocardiogram [ECG] [EKG]: Secondary | ICD-10-CM | POA: Diagnosis not present

## 2021-08-09 DIAGNOSIS — I10 Essential (primary) hypertension: Secondary | ICD-10-CM | POA: Diagnosis not present

## 2021-08-09 DIAGNOSIS — Z0189 Encounter for other specified special examinations: Secondary | ICD-10-CM | POA: Diagnosis present

## 2021-08-09 DIAGNOSIS — Z823 Family history of stroke: Secondary | ICD-10-CM

## 2021-08-09 DIAGNOSIS — Z9049 Acquired absence of other specified parts of digestive tract: Secondary | ICD-10-CM

## 2021-08-09 DIAGNOSIS — Z789 Other specified health status: Secondary | ICD-10-CM

## 2021-08-09 DIAGNOSIS — R29703 NIHSS score 3: Secondary | ICD-10-CM | POA: Diagnosis present

## 2021-08-09 DIAGNOSIS — R739 Hyperglycemia, unspecified: Secondary | ICD-10-CM | POA: Diagnosis not present

## 2021-08-09 DIAGNOSIS — I6523 Occlusion and stenosis of bilateral carotid arteries: Secondary | ICD-10-CM | POA: Diagnosis present

## 2021-08-09 DIAGNOSIS — G9340 Encephalopathy, unspecified: Secondary | ICD-10-CM

## 2021-08-09 DIAGNOSIS — Z79899 Other long term (current) drug therapy: Secondary | ICD-10-CM

## 2021-08-09 DIAGNOSIS — E782 Mixed hyperlipidemia: Secondary | ICD-10-CM | POA: Diagnosis not present

## 2021-08-09 DIAGNOSIS — Z794 Long term (current) use of insulin: Secondary | ICD-10-CM

## 2021-08-09 DIAGNOSIS — I3139 Other pericardial effusion (noninflammatory): Secondary | ICD-10-CM | POA: Diagnosis present

## 2021-08-09 DIAGNOSIS — Z888 Allergy status to other drugs, medicaments and biological substances status: Secondary | ICD-10-CM

## 2021-08-09 DIAGNOSIS — I6622 Occlusion and stenosis of left posterior cerebral artery: Secondary | ICD-10-CM | POA: Diagnosis not present

## 2021-08-09 DIAGNOSIS — R29701 NIHSS score 1: Secondary | ICD-10-CM | POA: Diagnosis not present

## 2021-08-09 DIAGNOSIS — Z87891 Personal history of nicotine dependence: Secondary | ICD-10-CM

## 2021-08-09 DIAGNOSIS — Z881 Allergy status to other antibiotic agents status: Secondary | ICD-10-CM

## 2021-08-09 HISTORY — DX: Cerebral infarction, unspecified: I63.9

## 2021-08-09 LAB — I-STAT CHEM 8, ED
BUN: 10 mg/dL (ref 8–23)
Calcium, Ion: 1.16 mmol/L (ref 1.15–1.40)
Chloride: 94 mmol/L — ABNORMAL LOW (ref 98–111)
Creatinine, Ser: 0.5 mg/dL (ref 0.44–1.00)
Glucose, Bld: 317 mg/dL — ABNORMAL HIGH (ref 70–99)
HCT: 51 % — ABNORMAL HIGH (ref 36.0–46.0)
Hemoglobin: 17.3 g/dL — ABNORMAL HIGH (ref 12.0–15.0)
Potassium: 4.3 mmol/L (ref 3.5–5.1)
Sodium: 135 mmol/L (ref 135–145)
TCO2: 32 mmol/L (ref 22–32)

## 2021-08-09 LAB — CBG MONITORING, ED
Glucose-Capillary: 287 mg/dL — ABNORMAL HIGH (ref 70–99)
Glucose-Capillary: 255 mg/dL — ABNORMAL HIGH (ref 70–99)
Glucose-Capillary: 205 mg/dL — ABNORMAL HIGH (ref 70–99)

## 2021-08-09 LAB — ECHOCARDIOGRAM COMPLETE
AR max vel: 2.17 cm2
AV Peak grad: 4.2 mmHg
Ao pk vel: 1.02 m/s
Area-P 1/2: 4.74 cm2
Calc EF: 47.7 %
S' Lateral: 2.7 cm
Single Plane A2C EF: 48.6 %
Single Plane A4C EF: 47.2 %
Weight: 2271.62 oz

## 2021-08-09 LAB — RAPID URINE DRUG SCREEN, HOSP PERFORMED
Amphetamines: NOT DETECTED
Barbiturates: NOT DETECTED
Benzodiazepines: NOT DETECTED
Cocaine: NOT DETECTED
Opiates: NOT DETECTED
Tetrahydrocannabinol: NOT DETECTED

## 2021-08-09 LAB — URINALYSIS, ROUTINE W REFLEX MICROSCOPIC
Bilirubin Urine: NEGATIVE
Glucose, UA: >=500 mg/dL — AB
Ketones, ur: 80 mg/dL — AB
Nitrite: NEGATIVE
Protein, ur: 30 mg/dL — AB
RBC / HPF: >50 RBC/hpf — ABNORMAL HIGH (ref 0–5)
Specific Gravity, Urine: >1.046 — ABNORMAL HIGH (ref 1.005–1.030)
WBC, UA: >50 WBC/hpf — ABNORMAL HIGH (ref 0–5)
pH: 5 (ref 5.0–8.0)

## 2021-08-09 LAB — COMPREHENSIVE METABOLIC PANEL
ALT: 18 U/L (ref 0–44)
AST: 29 U/L (ref 15–41)
Albumin: 3.8 g/dL (ref 3.5–5.0)
Alkaline Phosphatase: 50 U/L (ref 38–126)
Anion gap: 10 (ref 5–15)
BUN: 9 mg/dL (ref 8–23)
CO2: 29 mmol/L (ref 22–32)
Calcium: 9.4 mg/dL (ref 8.9–10.3)
Chloride: 96 mmol/L — ABNORMAL LOW (ref 98–111)
Creatinine, Ser: 0.63 mg/dL (ref 0.44–1.00)
GFR, Estimated: >60 mL/min (ref >60)
Glucose, Bld: 311 mg/dL — ABNORMAL HIGH (ref 70–99)
Potassium: 4.5 mmol/L (ref 3.5–5.1)
Sodium: 135 mmol/L (ref 135–145)
Total Bilirubin: 0.8 mg/dL (ref 0.3–1.2)
Total Protein: 7.2 g/dL (ref 6.5–8.1)

## 2021-08-09 LAB — DIFFERENTIAL
Abs Immature Granulocytes: 0.03 10*3/uL (ref 0.00–0.07)
Basophils Absolute: 0 10*3/uL (ref 0.0–0.1)
Basophils Relative: 1 %
Eosinophils Absolute: 0.1 10*3/uL (ref 0.0–0.5)
Eosinophils Relative: 2 %
Immature Granulocytes: 0 %
Lymphocytes Relative: 22 %
Lymphs Abs: 1.7 10*3/uL (ref 0.7–4.0)
Monocytes Absolute: 0.3 10*3/uL (ref 0.1–1.0)
Monocytes Relative: 4 %
Neutro Abs: 5.5 10*3/uL (ref 1.7–7.7)
Neutrophils Relative %: 71 %

## 2021-08-09 LAB — RESP PANEL BY RT-PCR (FLU A&B, COVID) ARPGX2
Influenza A by PCR: NEGATIVE
Influenza B by PCR: NEGATIVE
SARS Coronavirus 2 by RT PCR: NEGATIVE

## 2021-08-09 LAB — I-STAT VENOUS BLOOD GAS, ED
Acid-Base Excess: 2 mmol/L (ref 0.0–2.0)
Bicarbonate: 30.9 mmol/L — ABNORMAL HIGH (ref 20.0–28.0)
Calcium, Ion: 1.16 mmol/L (ref 1.15–1.40)
HCT: 63 % — ABNORMAL HIGH (ref 36.0–46.0)
Hemoglobin: 21.4 g/dL (ref 12.0–15.0)
O2 Saturation: 51 %
Potassium: 4.3 mmol/L (ref 3.5–5.1)
Sodium: 135 mmol/L (ref 135–145)
TCO2: 33 mmol/L — ABNORMAL HIGH (ref 22–32)
pCO2, Ven: 60 mmHg (ref 44–60)
pH, Ven: 7.32 (ref 7.25–7.43)
pO2, Ven: 30 mmHg — CL (ref 32–45)

## 2021-08-09 LAB — CBC
HCT: 48.2 % — ABNORMAL HIGH (ref 36.0–46.0)
Hemoglobin: 15.6 g/dL — ABNORMAL HIGH (ref 12.0–15.0)
MCH: 28.8 pg (ref 26.0–34.0)
MCHC: 32.4 g/dL (ref 30.0–36.0)
MCV: 88.9 fL (ref 80.0–100.0)
Platelets: 150 10*3/uL (ref 150–400)
RBC: 5.42 MIL/uL — ABNORMAL HIGH (ref 3.87–5.11)
RDW: 13.3 % (ref 11.5–15.5)
WBC: 7.7 10*3/uL (ref 4.0–10.5)
nRBC: 0 % (ref 0.0–0.2)

## 2021-08-09 LAB — AMMONIA: Ammonia: 40 umol/L — ABNORMAL HIGH (ref 9–35)

## 2021-08-09 LAB — LIPID PANEL
Cholesterol: 90 mg/dL (ref 0–200)
HDL: 38 mg/dL — ABNORMAL LOW (ref >40)
LDL Cholesterol: 42 mg/dL (ref 0–99)
Total CHOL/HDL Ratio: 2.4 RATIO
Triglycerides: 50 mg/dL (ref <150)
VLDL: 10 mg/dL (ref 0–40)

## 2021-08-09 LAB — APTT: aPTT: 30 seconds (ref 24–36)

## 2021-08-09 LAB — PROTIME-INR
INR: 1.1 (ref 0.8–1.2)
Prothrombin Time: 14 seconds (ref 11.4–15.2)

## 2021-08-09 MED ORDER — ALPRAZOLAM 0.25 MG PO TABS
0.2500 mg | ORAL_TABLET | Freq: Every evening | ORAL | Status: DC | PRN
  Filled 2021-08-09: qty 1

## 2021-08-09 MED ORDER — LACTATED RINGERS IV SOLN: INTRAVENOUS | Status: DC

## 2021-08-09 MED ORDER — PANTOPRAZOLE SODIUM 40 MG PO TBEC
40.0000 mg | DELAYED_RELEASE_TABLET | Freq: Every day | ORAL | Status: DC
  Administered 2021-08-09 – 2021-08-13 (×5): 40 mg via ORAL
  Filled 2021-08-09 (×5): qty 1

## 2021-08-09 MED ORDER — SENNOSIDES-DOCUSATE SODIUM 8.6-50 MG PO TABS: 1.0000 | ORAL_TABLET | Freq: Every evening | ORAL | Status: DC | PRN

## 2021-08-09 MED ORDER — ACETAMINOPHEN 325 MG PO TABS
650.0000 mg | ORAL_TABLET | ORAL | Status: DC | PRN
Start: 2021-08-09 — End: 2021-08-13
  Administered 2021-08-11 – 2021-08-12 (×2): 650 mg via ORAL
  Filled 2021-08-09 (×2): qty 2

## 2021-08-09 MED ORDER — THIAMINE HCL 100 MG/ML IJ SOLN: 100.0000 mg | Freq: Every day | INTRAMUSCULAR | Status: DC

## 2021-08-09 MED ORDER — ALBUTEROL SULFATE (2.5 MG/3ML) 0.083% IN NEBU: 3.0000 mL | INHALATION_SOLUTION | Freq: Four times a day (QID) | RESPIRATORY_TRACT | Status: DC | PRN

## 2021-08-09 MED ORDER — PRAVASTATIN SODIUM 40 MG PO TABS
20.0000 mg | ORAL_TABLET | Freq: Every day | ORAL | Status: DC
  Administered 2021-08-09 – 2021-08-13 (×5): 20 mg via ORAL
  Filled 2021-08-09 (×4): qty 1
  Filled 2021-08-09: qty 2

## 2021-08-09 MED ORDER — SODIUM CHLORIDE 0.9 % IV SOLN
1.0000 g | Freq: Once | INTRAVENOUS | Status: AC
  Administered 2021-08-09: 1 g via INTRAVENOUS
  Filled 2021-08-09: qty 10

## 2021-08-09 MED ORDER — SODIUM CHLORIDE 0.9 % IV SOLN: INTRAVENOUS | Status: DC

## 2021-08-09 MED ORDER — INSULIN DEGLUDEC 200 UNIT/ML ~~LOC~~ SOPN: PEN_INJECTOR | Freq: Every day | SUBCUTANEOUS | Status: DC

## 2021-08-09 MED ORDER — THIAMINE HCL 100 MG/ML IJ SOLN
500.0000 mg | Freq: Three times a day (TID) | INTRAVENOUS | Status: DC
  Administered 2021-08-09 – 2021-08-11 (×6): 500 mg via INTRAVENOUS
  Filled 2021-08-09 (×9): qty 5

## 2021-08-09 MED ORDER — ACETAMINOPHEN 160 MG/5ML PO SOLN: 650.0000 mg | ORAL | Status: DC | PRN

## 2021-08-09 MED ORDER — ASPIRIN EC 81 MG PO TBEC
81.0000 mg | DELAYED_RELEASE_TABLET | Freq: Every day | ORAL | Status: DC
  Administered 2021-08-09 – 2021-08-13 (×5): 81 mg via ORAL
  Filled 2021-08-09 (×5): qty 1

## 2021-08-09 MED ORDER — SODIUM CHLORIDE 0.9% FLUSH: 3.0000 mL | Freq: Once | INTRAVENOUS | Status: DC

## 2021-08-09 MED ORDER — IOHEXOL 350 MG/ML SOLN
100.0000 mL | Freq: Once | INTRAVENOUS | Status: AC | PRN
  Administered 2021-08-09: 100 mL via INTRAVENOUS

## 2021-08-09 MED ORDER — STROKE: EARLY STAGES OF RECOVERY BOOK
Freq: Once | Status: AC
  Filled 2021-08-09 (×2): qty 1

## 2021-08-09 MED ORDER — IPRATROPIUM-ALBUTEROL 0.5-2.5 (3) MG/3ML IN SOLN
3.0000 mL | Freq: Four times a day (QID) | RESPIRATORY_TRACT | Status: DC
  Administered 2021-08-09 – 2021-08-10 (×2): 3 mL via RESPIRATORY_TRACT
  Filled 2021-08-09 (×2): qty 3

## 2021-08-09 MED ORDER — INSULIN ASPART 100 UNIT/ML IJ SOLN
0.0000 [IU] | Freq: Three times a day (TID) | INTRAMUSCULAR | Status: DC
  Administered 2021-08-09: 8 [IU] via SUBCUTANEOUS
  Administered 2021-08-09: 5 [IU] via SUBCUTANEOUS
  Administered 2021-08-10: 2 [IU] via SUBCUTANEOUS
  Administered 2021-08-10 (×2): 3 [IU] via SUBCUTANEOUS
  Administered 2021-08-11: 8 [IU] via SUBCUTANEOUS
  Administered 2021-08-11: 3 [IU] via SUBCUTANEOUS
  Administered 2021-08-11: 5 [IU] via SUBCUTANEOUS
  Administered 2021-08-12: 8 [IU] via SUBCUTANEOUS
  Administered 2021-08-12 (×2): 11 [IU] via SUBCUTANEOUS
  Administered 2021-08-13 (×2): 3 [IU] via SUBCUTANEOUS
  Administered 2021-08-13: 8 [IU] via SUBCUTANEOUS

## 2021-08-09 MED ORDER — ENOXAPARIN SODIUM 40 MG/0.4ML IJ SOSY
40.0000 mg | PREFILLED_SYRINGE | INTRAMUSCULAR | Status: DC
  Administered 2021-08-09 – 2021-08-13 (×5): 40 mg via SUBCUTANEOUS
  Filled 2021-08-09 (×5): qty 0.4

## 2021-08-09 MED ORDER — FLUOROMETHOLONE 0.1 % OP SUSP: 1.0000 [drp] | Freq: Four times a day (QID) | OPHTHALMIC | Status: DC

## 2021-08-09 MED ORDER — INSULIN GLARGINE-YFGN 100 UNIT/ML ~~LOC~~ SOLN
26.0000 [IU] | Freq: Every day | SUBCUTANEOUS | Status: DC
  Administered 2021-08-09 – 2021-08-12 (×4): 26 [IU] via SUBCUTANEOUS
  Filled 2021-08-09 (×5): qty 0.26

## 2021-08-09 MED ORDER — ACETAMINOPHEN 650 MG RE SUPP: 650.0000 mg | RECTAL | Status: DC | PRN

## 2021-08-09 NOTE — Consult Note (Addendum)
NEUROLOGY CONSULTATION NOTE  ? ?Date of service: August 09, 2021 ?Patient Name: [name] ?MRN:  [id number] ?DOB:  09/10/1935 ?Reason for consult: "left facial droop and nonsensical speech" ?Requesting Provider: Regan Lemming, MD ? ?History of Present Illness  ?[name] is a 86 y.o. female with PMH HTN, DM2, HLD, COPD, Complete AV block s/p pacemaker. Patient presented to [facility] (08/09/2021) as a code stroke for left facial droop and nonsensical speech noted by caretaker. ?  ?CBG 364, BP 188/96 on arrival. ?LKN 08/08/2021, unclear.  ?NIHSS 6. ?tNK: No, outside of window.  ? ?Pacemaker placed 01/14/2020 for complete AV block. [employer] MRI 2272 Pacemaker [id number]. ? ?On arrival patient was awake, alert. Unable to follow commands. Speech was fluent and aphasic with paraphasias. Unable to repeat. Can name common used items. She's unable to cooperate for visual field testing, but does not blink to threat. Concerned for possible R VF cut. 5/5 strength intact in all 4 extremities. She responds to touch in all 4 limbs, but unreliably comments. Coordination grossly intact in all 4 limbs.  ? ?CT head, CT perfusion, CTA head and neck completed. ?Ordered rEEG and MRI brain. ?  ?ROS  ?Unable to assess ? ?Past History  ? ?Past Medical History:  ?Diagnosis Date  ? Acute and chronic respiratory failure with hypoxia (HCC)   ? Allergic rhinitis, cause unspecified   ? Sinus CT Rec 12-23-2009  ? Anxiety disorder, unspecified   ? Arthritis   ? Atrioventricular block, complete (HCC)   ? Benign paroxysmal positional vertigo 08/09/2012  ? Chronic airway obstruction, not elsewhere classified   ? 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 Chronic obstructive pulmonary disease, unspecified ([place])   ? Chronic rhinitis   ? Cognitive communication deficit   ? Complication of anesthesia   ? takes along time to wake up  ? Constipation, unspecified   ? COPD mixed type ([place])   ? Diabetes mellitus   ? Diarrhea   ? Diverticulosis   ? Dizziness and giddiness    ? Elevated troponin   ? Esophageal reflux   ? Esophageal stricture   ? Essential (primary) hypertension   ? Gastro-esophageal reflux disease without esophagitis   ? GERD (gastroesophageal reflux disease)   ? Glaucoma   ? Hiatal hernia   ? History of urinary tract infection   ? Hyperlipidemia, unspecified   ? Hypertension   ? Irritable bowel syndrome   ? Leukocytosis   ? Metabolic encephalopathy   ? Muscle weakness (generalized)   ? Neuropathy in diabetes ([place]) 08/09/2012  ? Obesity, unspecified   ? Oth speech/lang deficits following oth cerebvasc disease   ? Other abnormalities of gait and mobility   ? Other chronic nonalcoholic liver disease   ? Other forms of scoliosis, site unspecified   ? Other specified diseases of liver   ? Oxygen deficiency   ? Presence of permanent cardiac pacemaker 01/14/2020  ? Sciatica   ? Scoliosis   ? Shingles 2010  ? Shortness of breath   ? Spinal stenosis, lumbar region, without neurogenic claudication   ? Steatohepatitis   ? Thrombocytopenia, unspecified ([place])   ? Type 2 diabetes mellitus with diabetic polyneuropathy (HCC)   ? Type 2 diabetes mellitus with other diabetic neurological complication ([place])   ? Unsteadiness on feet   ? ?Past Surgical History:  ?Procedure Laterality Date  ? APPENDECTOMY    ? CARPAL TUNNEL RELEASE    ? right hand  ? CHOLECYSTECTOMY OPEN  1978  ?  COLONOSCOPY  07-2001  ? mild diverticulosis  ? ESOPHAGOGASTRODUODENOSCOPY  8185,63-14  ? H Hernia,es.stricture s/p dil 17F  ? INTRAOCULAR LENS INSERTION Bilateral   ? LIVER BIOPSY  08-1992  ? PACEMAKER IMPLANT N/A 01/14/2020  ? Procedure: PACEMAKER IMPLANT;  Surgeon: Evans Lance, MD;  Location: [facility];  Service: Cardiovascular;  Laterality: N/A;  ? PARATHYROID EXPLORATION    ? TONSILLECTOMY AND ADENOIDECTOMY    ? TOTAL ABDOMINAL HYSTERECTOMY    ? ULNAR NERVE TRANSPOSITION  12/07/2011  ? Procedure: ULNAR NERVE DECOMPRESSION/TRANSPOSITION;this was cancelled-not done  Surgeon: Cammie Sickle., MD;   Location: [facility];  Service: Orthopedics;  Laterality: Right;  right ulnar nerve in situ decompression  ? ULNAR TUNNEL RELEASE  03/07/2012  ? Procedure: CUBITAL TUNNEL RELEASE;  Surgeon: Roseanne Kaufman, MD;  Location: [facility];  Service: Orthopedics;  Laterality: Right;  ulnar nerve release at the elbow ? ?  ? ?Family History  ?Problem Relation Age of Onset  ? Heart disease Father   ? Lung cancer Mother   ?     small cell;Byssinosis  ? Lung cancer Sister   ? Liver cancer Sister   ?     ? mets from another area of the body  ? Diabetes Other   ?     grandmother  ? Stroke Maternal Grandfather   ? ?Social History  ? ?Socioeconomic History  ? Marital status: Single  ?  Spouse name: John,divorced, deceased  ? Number of children: 0  ? Years of education: high school  ? Highest education level: High school graduate  ?Occupational History  ? Occupation: Retired   ?  Comment: artist  ?Tobacco Use  ? Smoking status: Former  ?  Types: Cigarettes  ?  Quit date: 05/08/1988  ?  Years since quitting: 33.2  ? Smokeless tobacco: Never  ?Vaping Use  ? Vaping Use: Never used  ?Substance and Sexual Activity  ? Alcohol use: No  ?  Alcohol/week: 0.0 standard drinks  ? Drug use: No  ? Sexual activity: Not Currently  ?Other Topics Concern  ? Not on file  ?Social History Narrative  ? Not on file  ? ?Social Determinants of Health  ? ?Financial Resource Strain: Not on file  ?Food Insecurity: Not on file  ?Transportation Needs: Not on file  ?Physical Activity: Not on file  ?Stress: Not on file  ?Social Connections: Not on file  ? ?Allergies  ?Allergen Reactions  ? Chlordiazepoxide-Clidinium Other (See Comments)  ?  Sleepy,weak  ? 5-Alpha Reductase Inhibitors   ? Biaxin [Clarithromycin] Other (See Comments)  ?  Foul taste, abd pain, diarrhea  ? Tramadol Other (See Comments)  ?  Caused tremors  ? Codeine Other (See Comments)  ?  REACTION: gi upset- only in high doses per pt.  Can tolerate cough syrup with codeine.    ? ? ?Medications  ? ?No current facility-administered medications on file prior to encounter.  ? ?Current Outpatient Medications on File Prior to Encounter  ?Medication Sig Dispense Refill  ? acetaminophen (TYLENOL) 325 MG tablet Take 2 tablets (650 mg total) by mouth every 6 (six) hours as needed for mild pain (or Fever >/= 101). (Patient taking differently: Take 650 mg by mouth every 6 (six) hours as needed for mild pain.)    ? ALPRAZolam (XANAX) 0.5 MG tablet Take 0.5 tablets (0.25 mg total) by mouth at bedtime as needed for up to 30 doses for sleep. 15 tablet  0  ? aspirin EC 81 MG tablet Take 1 tablet (81 mg total) by mouth daily. Swallow whole. (Patient taking differently: Take 81 mg by mouth daily.) 90 tablet 3  ? furosemide (LASIX) 20 MG tablet Take 1 tablet (20 mg total) by mouth daily. 30 tablet 11  ? insulin aspart (NOVOLOG FLEXPEN) 100 UNIT/ML FlexPen INJECT 12-14 UNITS UNDER THE SKIN AT BREAKFAST, 14-16 UNITS AT LUNCH, AND 12-16 UNITS AT DINNER. (Patient taking differently: Inject 12 Units into the skin 3 (three) times daily.) 15 mL 3  ? insulin degludec (TRESIBA FLEXTOUCH) 200 UNIT/ML FlexTouch Pen Inject 36 Units into the skin at bedtime. 15 mL 3  ? ipratropium-albuterol (DUONEB) 0.5-2.5 (3) MG/3ML SOLN 1 vial in neb every 8 hours and as needed (Patient taking differently: Inhale 3 mLs into the lungs every 8 (eight) hours as needed (wheezing/shortness of breath).) 90 mL 0  ? irbesartan (AVAPRO) 75 MG tablet Take 75 mg by mouth daily.    ? levalbuterol (XOPENEX HFA) 45 MCG/ACT inhaler INHALE 1-2 PUFFS EVERY 6 HOURS IF NEEDED FOR WHEEZING. (Patient taking differently: Inhale 1-2 puffs into the lungs every 6 (six) hours as needed for wheezing.) 15 g 12  ? metoprolol succinate (TOPROL-XL) 50 MG 24 hr tablet Take 50 mg by mouth 2 (two) times daily.    ? omeprazole (PRILOSEC) 20 MG capsule TAKE (1) CAPSULE DAILY. (Patient taking differently: Take 20 mg by mouth daily.) 90 capsule 3  ? polyethylene glycol  powder (GLYCOLAX/MIRALAX) 17 GM/SCOOP powder Take 17 g by mouth daily.    ? pravastatin (PRAVACHOL) 20 MG tablet Take 1 tablet (20 mg total) by mouth daily. 90 tablet 1  ? Tiotropium Bromide-Olodaterol (ST

## 2021-08-09 NOTE — ED Triage Notes (Signed)
Pt BIB [facility] for expressive aphasia and confusion. LKW unknown. Code stroke was activated. See stroke RN note for further detail. Pt back to ED room 10 from CT at this time.  ?

## 2021-08-09 NOTE — H&P (Signed)
?History and Physical  ? ? ?Patient: [name] ZOX:[id number] DOB: 01-Jun-1935 ?DOA: 08/09/2021 ?DOS: the patient was seen and examined on 08/09/2021 ?PCP: Wardell Honour, MD  ?Patient coming from: Home - lives alone; [facility]: [facility], [phone] ? ? ?Chief Complaint: Stroke-like symptoms ? ?HPI: [name] is a 87 y.o. female with medical history significant of anxiety; COPD on home O2; DM; HTN; HLD; pacemaker placement; dementia; and NASH presenting with stroke-like symptoms.  She has clear aphasia and speaks random and inappropriate words in the midst of conversation and so is really unable to provide history.  I called and spoke with her home health agency.  She is part of [facility] and would have a case Freight forwarder, Ms. Delma Freeze, [phone].  She is usually verbal and it makes sense.  Her aide went to check on her this am and she was talking strange, wasn't putting words together well.  She then complained of chest pain and so the aide called 911.  EMS thought she was having a stroke.   The patient says things like "I feel tall" and "My cat has places places, we go the places places" that are nonsensical.  She has no advanced directives.  She has no family members but does have a back up contact, [name], [phone]. ? ? ? ?ER Course:  LKW maybe 1-2 days before.  H/o dementia, came in as code stroke.  Presented with aphasia, encephalopathy and no focal deficits.  Imaging unremarkable.  MRI can't happen today due to pacer.  Neuro recommends EEG, MRI, AMS work-up.   ? ? ? ? ?Review of Systems: unable to review all systems due to the inability of the patient to answer questions. ?Past Medical History:  ?Diagnosis Date  ? Anxiety disorder, unspecified   ? Arthritis   ? Benign paroxysmal positional vertigo 08/09/2012  ? Chronic rhinitis   ? Cognitive communication deficit   ? Constipation, unspecified   ? COPD mixed type ([place])   ? on home O2  ? Diverticulosis   ? Esophageal stricture   ?  Essential (primary) hypertension   ? Gastro-esophageal reflux disease without esophagitis   ? Glaucoma   ? Hiatal hernia   ? Hyperlipidemia, unspecified   ? Irritable bowel syndrome   ? Presence of permanent cardiac pacemaker 01/14/2020  ? Spinal stenosis, lumbar region, without neurogenic claudication   ? Steatohepatitis   ? Thrombocytopenia, unspecified ([place])   ? Type 2 diabetes mellitus with diabetic polyneuropathy (HCC)   ? ?Past Surgical History:  ?Procedure Laterality Date  ? APPENDECTOMY    ? CARPAL TUNNEL RELEASE    ? right hand  ? CHOLECYSTECTOMY OPEN  1978  ? COLONOSCOPY  07-2001  ? mild diverticulosis  ? ESOPHAGOGASTRODUODENOSCOPY  4132,44-01  ? H Hernia,es.stricture s/p dil 74F  ? INTRAOCULAR LENS INSERTION Bilateral   ? LIVER BIOPSY  08-1992  ? PACEMAKER IMPLANT N/A 01/14/2020  ? Procedure: PACEMAKER IMPLANT;  Surgeon: Evans Lance, MD;  Location: [facility];  Service: Cardiovascular;  Laterality: N/A;  ? PARATHYROID EXPLORATION    ? TONSILLECTOMY AND ADENOIDECTOMY    ? TOTAL ABDOMINAL HYSTERECTOMY    ? ULNAR NERVE TRANSPOSITION  12/07/2011  ? Procedure: ULNAR NERVE DECOMPRESSION/TRANSPOSITION;this was cancelled-not done  Surgeon: Cammie Sickle., MD;  Location: [place];  Service: Orthopedics;  Laterality: Right;  right ulnar nerve in situ decompression  ? ULNAR TUNNEL RELEASE  03/07/2012  ? Procedure: CUBITAL TUNNEL RELEASE;  Surgeon: Roseanne Kaufman, MD;  Location: [place];  Service: Orthopedics;  Laterality: Right;  ulnar nerve release at the elbow ? ?  ? ?Social History:  reports that she quit smoking about 33 years ago. Her smoking use included cigarettes. She has never used smokeless tobacco. She reports that she does not drink alcohol and does not use drugs. ? ?Allergies  ?Allergen Reactions  ? Chlordiazepoxide-Clidinium Other (See Comments)  ?  Sleepy,weak  ? 5-Alpha Reductase Inhibitors   ? Biaxin [Clarithromycin] Other (See Comments)  ?  Foul taste,  abd pain, diarrhea  ? Tramadol Other (See Comments)  ?  Caused tremors  ? Codeine Other (See Comments)  ?  REACTION: gi upset- only in high doses per pt.  Can tolerate cough syrup with codeine.   ? ? ?Family History  ?Problem Relation Age of Onset  ? Heart disease Father   ? Lung cancer Mother   ?     small cell;Byssinosis  ? Lung cancer Sister   ? Liver cancer Sister   ?     ? mets from another area of the body  ? Diabetes Other   ?     grandmother  ? Stroke Maternal Grandfather   ? ? ?Prior to Admission medications   ?Medication Sig Start Date End Date Taking? Authorizing Provider  ?acetaminophen (TYLENOL) 325 MG tablet Take 2 tablets (650 mg total) by mouth every 6 (six) hours as needed for mild pain (or Fever >/= 101). 01/23/20   Domenic Polite, MD  ?albuterol (VENTOLIN HFA) 108 (90 Base) MCG/ACT inhaler INHALE 2 PUFFS INTO THE LUNGS EVERY 6 HOURS AS NEEDED FOR WHEEZING OR SHORTNESS OF BREATH. ?Patient taking differently: Inhale 2 puffs into the lungs every 6 (six) hours as needed for shortness of breath or wheezing. 10/29/20   Deneise Lever, MD  ?ALPRAZolam Duanne Moron) 0.5 MG tablet Take 0.5 tablets (0.25 mg total) by mouth at bedtime as needed for up to 30 doses for sleep. 03/03/21   Ngetich, Dinah C, NP  ?aspirin EC 81 MG tablet Take 1 tablet (81 mg total) by mouth daily. Swallow whole. 04/26/20   Evans Lance, MD  ?cholestyramine Lucrezia Starch) 4 g packet Take 1 packet (4 g total) by mouth 2 (two) times daily as needed. diarrhea 09/13/20   Lauree Chandler, NP  ?ciprofloxacin (CIPRO) 250 MG tablet Take 1 tablet (250 mg total) by mouth 2 (two) times daily. ?Patient not taking: Reported on 08/09/2021 07/20/21   Wardell Honour, MD  ?Cranberry 475 MG CAPS Take 1 capsule (475 mg total) by mouth 2 (two) times daily. 06/16/21   Ngetich, Dinah C, NP  ?fluorometholone (FML) 0.1 % ophthalmic suspension Place 1 drop into the right eye 4 (four) times daily. 06/22/21   [provider]  ?furosemide (LASIX) 20 MG tablet  Take 1 tablet (20 mg total) by mouth daily. 08/13/20 03/04/21  Evans Lance, MD  ?glucose blood (ACCU-CHEK AVIVA PLUS) test strip Use to test blood sugar four times daily. Dx: E11.9 03/07/21   Wardell Honour, MD  ?insulin aspart (NOVOLOG FLEXPEN) 100 UNIT/ML FlexPen INJECT 12-14 UNITS UNDER THE SKIN AT BREAKFAST, 14-16 UNITS AT LUNCH, AND 12-16 UNITS AT DINNER. ?Patient taking differently: Inject 12-16 Units into the skin 3 (three) times daily. 12-14 units at breakfast, 14-16 units at lunch, 12-16 units at dinner 12/08/20   Lauree Chandler, NP  ?insulin degludec (TRESIBA FLEXTOUCH) 200 UNIT/ML FlexTouch Pen Inject 36 Units into the skin  at bedtime. 11/18/20   Lauree Chandler, NP  ?ipratropium-albuterol (DUONEB) 0.5-2.5 (3) MG/3ML SOLN 1 vial in neb every 8 hours and as needed 01/17/21   Baird Lyons D, MD  ?levalbuterol (XOPENEX HFA) 45 MCG/ACT inhaler INHALE 1-2 PUFFS EVERY 6 HOURS IF NEEDED FOR WHEEZING. ?Patient taking differently: Inhale 1-2 puffs into the lungs every 6 (six) hours as needed for wheezing. 05/31/21   Deneise Lever, MD  ?magic mouthwash SOLN Swish with 2 teaspoonfuls by mouth as needed ?Patient not taking: Reported on 08/09/2021 06/09/20   Hollace Kinnier L, DO  ?metoprolol succinate (TOPROL-XL) 50 MG 24 hr tablet Take 50 mg by mouth 2 (two) times daily. Take with or immediately following a meal.    [provider]  ?mometasone (ELOCON) 0.1 % cream Apply small amount bid to affected areas 10/12/20   Wardell Honour, MD  ?nystatin (MYCOSTATIN) 100000 UNIT/ML suspension  06/09/20   [provider]  ?nystatin cream (MYCOSTATIN) Apply 1 application topically 2 (two) times daily. Apply to affected red areas on buttock 03/03/21   Ngetich, Dinah C, NP  ?omeprazole (PRILOSEC) 20 MG capsule TAKE (1) CAPSULE DAILY. ?Patient taking differently: Take 20 mg by mouth daily. 04/25/21   Wardell Honour, MD  ?pravastatin (PRAVACHOL) 20 MG tablet Take 1 tablet (20 mg total) by mouth daily.  02/28/21   Wardell Honour, MD  ?predniSONE (DELTASONE) 20 MG tablet Take 1 tablet (20 mg total) by mouth daily with breakfast. ?Patient not taking: Reported on 08/09/2021 05/18/21   Suzan Slick, NP  ?sodiu

## 2021-08-09 NOTE — Assessment & Plan Note (Signed)
-  On home O2 per chart ?-Continue Duoneb and prn Albuterol ?-She also uses Stiolto and likely doesn't need both tiotropium and ipratropium ?

## 2021-08-09 NOTE — Code Documentation (Signed)
Ms. Kanaan is a 86 yr old female with past medical history of COPD GERD DM UTI HTN Thrombocytopenia, and complete heart block with pacemaker. She is on no thinners. She was last known to be at her baseline some time yesterday. She was found to have altered speech today, and EMS was dispatched. Pt arrived [facility] at 1038. She had a fluent aphasia. Could not answer LOC questions, but could follow commands if pantomimed. No weakness noted. Pt cleared at bridge by EDP. CBG and labs obtained. Pt then taken to CT at 1045. CTNC negative for acute hemorrhage per Dr Cheral Marker. IV access obtained, then pt had CTA and P. Per Dr Cheral Marker, CTA negative for LVO. Pt returned to room 10. She will need q 2 hr. VS and NIHSS while her workup continues. Bedside handoff with Adam RN complete. Pt not eligible for TNK as OOW. Not eligible for NIR as LVO negative. ?

## 2021-08-09 NOTE — Assessment & Plan Note (Signed)
-  Permissive HTN for now ?-Hold Toprol XL ?

## 2021-08-09 NOTE — Assessment & Plan Note (Addendum)
-  Patient presenting with aphasia and concern for stroke ?-Code stroke was called ?-Neurology consulted ?-CT and CTA unremarkable, MRI pending (have to wait until tomorrow due to pacer) ?-Neurology is now more concerned about metabolic encephalopathy and recommends work-up accordingly ?-EEG also recommended by neuro to evaluate for CVA ?

## 2021-08-09 NOTE — Assessment & Plan Note (Signed)
-  Patient presenting with concern for encephalopathy as evidenced by her atypical speech pattern ?-While the patient does have underlying dementia, this is a change compared to her usual baseline mental status ?-Uncertain when was her last normal ?-Evaluation thus far unremarkable ?-Based on unremarkable evaluation with current ability to protect her airway, will observe for now with IVF hydration and telemetry monitoring ?-50% of patients with delirium while hospitalized will be institutionalized at 6 months, and these patients have a 25% mortality at 6 months ?-The family would benefit from being referred to the [facility] and also provided with the [employer] website ?

## 2021-08-09 NOTE — Assessment & Plan Note (Signed)
-  There is no family available to confirm her baseline mental status ?-I have spoken with her HHN, her case worker, and left a message for a friend ?-Based on currently available information, this does appear to be different from her usual baseline ?

## 2021-08-09 NOTE — Progress Notes (Signed)
Inpatient Diabetes Program Recommendations ? ?AACE/ADA: New Consensus Statement on Inpatient Glycemic Control (2015) ? ?Target Ranges:  Prepandial:   less than 140 mg/dL ?     Peak postprandial:   less than 180 mg/dL (1-2 hours) ?     Critically ill patients:  140 - 180 mg/dL  ? ?Lab Results  ?Component Value Date  ? GLUCAP 287 (H) 08/09/2021  ? HGBA1C 11.6 (H) 05/31/2021  ? ? ?Review of Glycemic Control ? ?Diabetes history: DM2 ?Outpatient Diabetes medications: Tresiba 36 units QHS, Novolog 12-14 units with breakfast, 14-16 units with lunch, 12-16 units with dinner ?Current orders for Inpatient glycemic control: Novolog 0-15 units TID ? ?Inpatient Diabetes Program Recommendations:   ? ?Semglee 15 units QHS (50% of home dose) ? ?Will continue to follow while inpatient. ? ?Thank you, ?Reche Dixon, MSN, RN ?Diabetes Coordinator ?Inpatient Diabetes Program ?[phone] (team pager from 8a-5p) ? ? ? ?

## 2021-08-09 NOTE — ED Notes (Signed)
Pt has poor venous access. Phlebotomy notified for blood draw.  ?

## 2021-08-09 NOTE — Procedures (Signed)
Patient Name: [name]  ?MRN: [id number]  ?Epilepsy Attending: Lora Havens  ?Referring Physician/Provider: Merrily Brittle, DO ?Date: 08/09/2021 ?Duration: 22.37 mins ? ?Patient history: 83 old female with new onset aphasia.  EEG to evaluate for seizure. ? ?Level of alertness: Awake ? ?AEDs during EEG study: None ? ?Technical aspects: This EEG study was done with scalp electrodes positioned according to the 10-20 International system of electrode placement. Electrical activity was acquired at a sampling rate of 500Hz  and reviewed with a high frequency filter of 70Hz  and a low frequency filter of 1Hz . EEG data were recorded continuously and digitally stored.  ? ?Description: No clear posterior dominant rhythm was seen.  EEG showed continuous generalized and lateralized left hemisphere 3 to 7 Hz theta-delta slowing.  Hyperventilation and photic stimulation were not performed.    ? ?ABNORMALITY ?- Continuous slow, generalized and lateralized left hemisphere ? ?IMPRESSION: ?This study is suggestive of cortical dysfunction in left hemisphere which could be secondary to underlying structural abnormality, postictal state.  Additionally there is moderate diffuse encephalopathy, nonspecific etiology.  No seizures or epileptiform discharges were seen throughout the recording. ? ?Lora Havens  ? ?

## 2021-08-09 NOTE — ED Provider Notes (Signed)
?[facility] ?Provider Note ? ? ?CSN: [id number] ?Arrival date & time: 08/09/21  1038 ? ?An emergency department physician performed an initial assessment on this suspected stroke patient at 12. ? ?History ? ?No chief complaint on file. ? ? ?[name] is a 86 y.o. female. ? ?HPI ? ?86 year old female with medical history significant for COPD, GERD, BPPV, DM 2, HLD, HTN, complete heart block status post permanent cardiac pacemaker, dementia, Sjogren syndrome who presents to the emergency department as a code stroke. ? ?The history is provided by EMS as the patient was unable provide much history.  She reportedly resides alone at home.  Caretaker will come by the house to check on her.  Her last known normal was on 08/08/2021.  She was found this morning at 930 with aphasia, confusion, inability to follow commands.  The patient arrived [facility] intact, neurology bedside to provide stroke assessment. ? ?Home Medications ?Prior to Admission medications   ?Medication Sig Start Date End Date Taking? Authorizing Provider  ?acetaminophen (TYLENOL) 325 MG tablet Take 2 tablets (650 mg total) by mouth every 6 (six) hours as needed for mild pain (or Fever >/= 101). ?Patient taking differently: Take 650 mg by mouth every 6 (six) hours as needed for mild pain. 01/23/20  Yes Domenic Polite, MD  ?ALPRAZolam Duanne Moron) 0.5 MG tablet Take 0.5 tablets (0.25 mg total) by mouth at bedtime as needed for up to 30 doses for sleep. 03/03/21  Yes Ngetich, Dinah C, NP  ?aspirin EC 81 MG tablet Take 1 tablet (81 mg total) by mouth daily. Swallow whole. ?Patient taking differently: Take 81 mg by mouth daily. 04/26/20  Yes Evans Lance, MD  ?furosemide (LASIX) 20 MG tablet Take 1 tablet (20 mg total) by mouth daily. 08/13/20 10/15/21 Yes Evans Lance, MD  ?insulin aspart (NOVOLOG FLEXPEN) 100 UNIT/ML FlexPen INJECT 12-14 UNITS UNDER THE SKIN AT BREAKFAST, 14-16 UNITS AT LUNCH, AND 12-16 UNITS AT  DINNER. ?Patient taking differently: Inject 12 Units into the skin 3 (three) times daily. 12/08/20  Yes Lauree Chandler, NP  ?insulin degludec (TRESIBA FLEXTOUCH) 200 UNIT/ML FlexTouch Pen Inject 36 Units into the skin at bedtime. 11/18/20  Yes Lauree Chandler, NP  ?ipratropium-albuterol (DUONEB) 0.5-2.5 (3) MG/3ML SOLN 1 vial in neb every 8 hours and as needed ?Patient taking differently: Inhale 3 mLs into the lungs every 8 (eight) hours as needed (wheezing/shortness of breath). 01/17/21  Yes Young, Tarri Fuller D, MD  ?irbesartan (AVAPRO) 75 MG tablet Take 75 mg by mouth daily.   Yes [provider]  ?levalbuterol (XOPENEX HFA) 45 MCG/ACT inhaler INHALE 1-2 PUFFS EVERY 6 HOURS IF NEEDED FOR WHEEZING. ?Patient taking differently: Inhale 1-2 puffs into the lungs every 6 (six) hours as needed for wheezing. 05/31/21  Yes Baird Lyons D, MD  ?metoprolol succinate (TOPROL-XL) 50 MG 24 hr tablet Take 50 mg by mouth 2 (two) times daily.   Yes [provider]  ?omeprazole (PRILOSEC) 20 MG capsule TAKE (1) CAPSULE DAILY. ?Patient taking differently: Take 20 mg by mouth daily. 04/25/21  Yes Wardell Honour, MD  ?polyethylene glycol powder (GLYCOLAX/MIRALAX) 17 GM/SCOOP powder Take 17 g by mouth daily.   Yes [provider]  ?pravastatin (PRAVACHOL) 20 MG tablet Take 1 tablet (20 mg total) by mouth daily. 02/28/21  Yes Wardell Honour, MD  ?Tiotropium Bromide-Olodaterol (STIOLTO RESPIMAT) 2.5-2.5 MCG/ACT AERS Inhale 2 puffs into the lungs daily. 03/09/21  Yes Deneise Lever, MD  ?  albuterol (VENTOLIN HFA) 108 (90 Base) MCG/ACT inhaler INHALE 2 PUFFS INTO THE LUNGS EVERY 6 HOURS AS NEEDED FOR WHEEZING OR SHORTNESS OF BREATH. ?Patient not taking: Reported on 08/09/2021 10/29/20   Deneise Lever, MD  ?cholestyramine Lucrezia Starch) 4 g packet Take 1 packet (4 g total) by mouth 2 (two) times daily as needed. diarrhea ?Patient not taking: Reported on 08/09/2021 09/13/20   Lauree Chandler, NP  ?Cranberry 475  MG CAPS Take 1 capsule (475 mg total) by mouth 2 (two) times daily. ?Patient not taking: Reported on 08/09/2021 06/16/21   Ngetich, Dinah C, NP  ?glucose blood (ACCU-CHEK AVIVA PLUS) test strip Use to test blood sugar four times daily. Dx: E11.9 03/07/21   Wardell Honour, MD  ?mometasone (ELOCON) 0.1 % cream Apply small amount bid to affected areas ?Patient not taking: Reported on 08/09/2021 10/12/20   Wardell Honour, MD  ?nystatin cream (MYCOSTATIN) Apply 1 application topically 2 (two) times daily. Apply to affected red areas on buttock ?Patient not taking: Reported on 08/09/2021 03/03/21   Ngetich, Dinah C, NP  ?sodium chloride HYPERTONIC 3 % nebulizer solution 1 ampule neb every 6 hours if needed to clear mucus ?Patient not taking: Reported on 08/09/2021 02/22/20   Ngetich, Nelda Bucks, NP  ?   ? ?Allergies    ?Chlordiazepoxide-clidinium, Biaxin [clarithromycin], Tramadol, and Codeine   ? ?Review of Systems   ?Review of Systems  ?Unable to perform ROS: Dementia  ? ?Physical Exam ?Updated Vital Signs ?BP 105/86   Pulse 93   Temp 99.3 ?F (37.4 ?C) (Oral)   Resp 16   Wt 64.4 kg   SpO2 95%   BMI 25.15 kg/m?  ?Physical Exam ?Vitals and nursing note reviewed.  ?Constitutional:   ?   General: She is not in acute distress. ?HENT:  ?   Head: Normocephalic and atraumatic.  ?Eyes:  ?   Conjunctiva/sclera: Conjunctivae normal.  ?   Pupils: Pupils are equal, round, and reactive to light.  ?Cardiovascular:  ?   Rate and Rhythm: Normal rate and regular rhythm.  ?Pulmonary:  ?   Effort: Pulmonary effort is normal. No respiratory distress.  ?Abdominal:  ?   General: There is no distension.  ?   Tenderness: There is no guarding.  ?Musculoskeletal:     ?   General: No deformity or signs of injury.  ?   Cervical back: Neck supple.  ?Skin: ?   Findings: No lesion or rash.  ?Neurological:  ?   Mental Status: She is alert. She is disoriented.  ?   Comments: Disoriented, aphasic, no clear CN deficit. Moving all four extremities  ? ? ?ED  Results / Procedures / Treatments   ?Labs ?(all labs ordered are listed, but only abnormal results are displayed) ?Labs Reviewed  ?CBC - Abnormal; Notable for the following components:  ?    Result Value  ? RBC 5.42 (*)   ? Hemoglobin 15.6 (*)   ? HCT 48.2 (*)   ? All other components within normal limits  ?COMPREHENSIVE METABOLIC PANEL - Abnormal; Notable for the following components:  ? Chloride 96 (*)   ? Glucose, Bld 311 (*)   ? All other components within normal limits  ?LIPID PANEL - Abnormal; Notable for the following components:  ? HDL 38 (*)   ? All other components within normal limits  ?URINALYSIS, ROUTINE W REFLEX MICROSCOPIC - Abnormal; Notable for the following components:  ? APPearance CLOUDY (*)   ? Specific Gravity,  Urine >1.046 (*)   ? Glucose, UA >=500 (*)   ? Hgb urine dipstick SMALL (*)   ? Ketones, ur 80 (*)   ? Protein, ur 30 (*)   ? Leukocytes,Ua LARGE (*)   ? RBC / HPF >50 (*)   ? WBC, UA >50 (*)   ? Bacteria, UA MANY (*)   ? All other components within normal limits  ?AMMONIA - Abnormal; Notable for the following components:  ? Ammonia 40 (*)   ? All other components within normal limits  ?I-STAT CHEM 8, ED - Abnormal; Notable for the following components:  ? Chloride 94 (*)   ? Glucose, Bld 317 (*)   ? Hemoglobin 17.3 (*)   ? HCT 51.0 (*)   ? All other components within normal limits  ?CBG MONITORING, ED - Abnormal; Notable for the following components:  ? Glucose-Capillary 287 (*)   ? All other components within normal limits  ?I-STAT VENOUS BLOOD GAS, ED - Abnormal; Notable for the following components:  ? pO2, Ven 30 (*)   ? Bicarbonate 30.9 (*)   ? TCO2 33 (*)   ? HCT 63.0 (*)   ? Hemoglobin 21.4 (*)   ? All other components within normal limits  ?CBG MONITORING, ED - Abnormal; Notable for the following components:  ? Glucose-Capillary 255 (*)   ? All other components within normal limits  ?CBG MONITORING, ED - Abnormal; Notable for the following components:  ? Glucose-Capillary 205  (*)   ? All other components within normal limits  ?RESP PANEL BY RT-PCR (FLU A&B, COVID) [id number]  ?CULTURE, BLOOD (ROUTINE X 2)  ?CULTURE, BLOOD (ROUTINE X 2)  ?URINE CULTURE  ?PROTIME-INR  ?APTT  ?DIFFERENTIAL  ?RAPID URI

## 2021-08-09 NOTE — ED Notes (Signed)
Called pt's home care agency. They advised pt does not have a next of kin listed.  ?

## 2021-08-09 NOTE — Progress Notes (Signed)
EEG complete - results pending 

## 2021-08-09 NOTE — Assessment & Plan Note (Signed)
-  Continue Pravachol ?

## 2021-08-09 NOTE — Assessment & Plan Note (Signed)
-  Prior A1c was 11.6, indicating poor control ?-Continue [place] ?-Cover with moderate-scale SSI ?

## 2021-08-09 NOTE — Assessment & Plan Note (Signed)
-  Had complete heart block, s/p PPM ?-It is MRI compatible but needs special timeslot - not available until tomorrow ?

## 2021-08-10 ENCOUNTER — Observation Stay (HOSPITAL_COMMUNITY): Payer: Medicare Other

## 2021-08-10 DIAGNOSIS — E1149 Type 2 diabetes mellitus with other diabetic neurological complication: Secondary | ICD-10-CM | POA: Diagnosis present

## 2021-08-10 DIAGNOSIS — Z950 Presence of cardiac pacemaker: Secondary | ICD-10-CM | POA: Diagnosis not present

## 2021-08-10 DIAGNOSIS — M419 Scoliosis, unspecified: Secondary | ICD-10-CM | POA: Diagnosis present

## 2021-08-10 DIAGNOSIS — I6523 Occlusion and stenosis of bilateral carotid arteries: Secondary | ICD-10-CM | POA: Diagnosis present

## 2021-08-10 DIAGNOSIS — G9341 Metabolic encephalopathy: Secondary | ICD-10-CM | POA: Diagnosis present

## 2021-08-10 DIAGNOSIS — I442 Atrioventricular block, complete: Secondary | ICD-10-CM | POA: Diagnosis present

## 2021-08-10 DIAGNOSIS — K7581 Nonalcoholic steatohepatitis (NASH): Secondary | ICD-10-CM | POA: Diagnosis present

## 2021-08-10 DIAGNOSIS — I639 Cerebral infarction, unspecified: Secondary | ICD-10-CM

## 2021-08-10 DIAGNOSIS — R410 Disorientation, unspecified: Secondary | ICD-10-CM | POA: Diagnosis not present

## 2021-08-10 DIAGNOSIS — E782 Mixed hyperlipidemia: Secondary | ICD-10-CM | POA: Diagnosis not present

## 2021-08-10 DIAGNOSIS — R29818 Other symptoms and signs involving the nervous system: Secondary | ICD-10-CM | POA: Diagnosis not present

## 2021-08-10 DIAGNOSIS — R29701 NIHSS score 1: Secondary | ICD-10-CM | POA: Diagnosis not present

## 2021-08-10 DIAGNOSIS — F0394 Unspecified dementia, unspecified severity, with anxiety: Secondary | ICD-10-CM | POA: Diagnosis present

## 2021-08-10 DIAGNOSIS — E1165 Type 2 diabetes mellitus with hyperglycemia: Secondary | ICD-10-CM | POA: Diagnosis present

## 2021-08-10 DIAGNOSIS — J439 Emphysema, unspecified: Secondary | ICD-10-CM | POA: Diagnosis present

## 2021-08-10 DIAGNOSIS — G3184 Mild cognitive impairment, so stated: Secondary | ICD-10-CM | POA: Diagnosis not present

## 2021-08-10 DIAGNOSIS — Z20822 Contact with and (suspected) exposure to covid-19: Secondary | ICD-10-CM | POA: Diagnosis present

## 2021-08-10 DIAGNOSIS — E785 Hyperlipidemia, unspecified: Secondary | ICD-10-CM | POA: Diagnosis present

## 2021-08-10 DIAGNOSIS — E1142 Type 2 diabetes mellitus with diabetic polyneuropathy: Secondary | ICD-10-CM | POA: Diagnosis present

## 2021-08-10 DIAGNOSIS — N390 Urinary tract infection, site not specified: Secondary | ICD-10-CM | POA: Diagnosis present

## 2021-08-10 DIAGNOSIS — I10 Essential (primary) hypertension: Secondary | ICD-10-CM

## 2021-08-10 DIAGNOSIS — R29706 NIHSS score 6: Secondary | ICD-10-CM | POA: Diagnosis not present

## 2021-08-10 DIAGNOSIS — J9611 Chronic respiratory failure with hypoxia: Secondary | ICD-10-CM | POA: Diagnosis present

## 2021-08-10 DIAGNOSIS — F419 Anxiety disorder, unspecified: Secondary | ICD-10-CM

## 2021-08-10 DIAGNOSIS — R54 Age-related physical debility: Secondary | ICD-10-CM | POA: Diagnosis present

## 2021-08-10 DIAGNOSIS — I3139 Other pericardial effusion (noninflammatory): Secondary | ICD-10-CM | POA: Diagnosis present

## 2021-08-10 DIAGNOSIS — M3500 Sicca syndrome, unspecified: Secondary | ICD-10-CM | POA: Diagnosis present

## 2021-08-10 DIAGNOSIS — R4701 Aphasia: Secondary | ICD-10-CM | POA: Diagnosis present

## 2021-08-10 DIAGNOSIS — R2990 Unspecified symptoms and signs involving the nervous system: Secondary | ICD-10-CM | POA: Diagnosis not present

## 2021-08-10 DIAGNOSIS — J449 Chronic obstructive pulmonary disease, unspecified: Secondary | ICD-10-CM | POA: Diagnosis not present

## 2021-08-10 DIAGNOSIS — R29703 NIHSS score 3: Secondary | ICD-10-CM | POA: Diagnosis present

## 2021-08-10 HISTORY — DX: Cerebral infarction, unspecified: I63.9

## 2021-08-10 LAB — GLUCOSE, CAPILLARY
Glucose-Capillary: 201 mg/dL — ABNORMAL HIGH (ref 70–99)
Glucose-Capillary: 169 mg/dL — ABNORMAL HIGH (ref 70–99)
Glucose-Capillary: 146 mg/dL — ABNORMAL HIGH (ref 70–99)
Glucose-Capillary: 193 mg/dL — ABNORMAL HIGH (ref 70–99)
Glucose-Capillary: 254 mg/dL — ABNORMAL HIGH (ref 70–99)

## 2021-08-10 LAB — HEMOGLOBIN A1C
Hgb A1c MFr Bld: 15 % — ABNORMAL HIGH (ref 4.8–5.6)
Mean Plasma Glucose: 384 mg/dL

## 2021-08-10 LAB — CBC
HCT: 45.4 % (ref 36.0–46.0)
Hemoglobin: 14.6 g/dL (ref 12.0–15.0)
MCH: 28.5 pg (ref 26.0–34.0)
MCHC: 32.2 g/dL (ref 30.0–36.0)
MCV: 88.7 fL (ref 80.0–100.0)
Platelets: 143 10*3/uL — ABNORMAL LOW (ref 150–400)
RBC: 5.12 MIL/uL — ABNORMAL HIGH (ref 3.87–5.11)
RDW: 13.5 % (ref 11.5–15.5)
WBC: 7.1 10*3/uL (ref 4.0–10.5)
nRBC: 0 % (ref 0.0–0.2)

## 2021-08-10 LAB — VITAMIN B12: Vitamin B-12: 456 pg/mL (ref 180–914)

## 2021-08-10 LAB — FOLATE: Folate: 16 ng/mL (ref >5.9)

## 2021-08-10 LAB — TSH: TSH: 1.621 u[IU]/mL (ref 0.350–4.500)

## 2021-08-10 LAB — HIV ANTIBODY (ROUTINE TESTING W REFLEX): HIV Screen 4th Generation wRfx: NONREACTIVE

## 2021-08-10 LAB — SEDIMENTATION RATE: Sed Rate: 3 mm/hr (ref 0–22)

## 2021-08-10 MED ORDER — GLUCERNA SHAKE PO LIQD
237.0000 mL | Freq: Two times a day (BID) | ORAL | Status: DC
Start: 2021-08-10 — End: 2021-08-13
  Administered 2021-08-10 – 2021-08-12 (×6): 237 mL via ORAL

## 2021-08-10 MED ORDER — ADULT MULTIVITAMIN W/MINERALS CH
1.0000 | ORAL_TABLET | Freq: Every day | ORAL | Status: DC
  Administered 2021-08-10 – 2021-08-12 (×3): 1 via ORAL
  Filled 2021-08-10 (×4): qty 1

## 2021-08-10 MED ORDER — IPRATROPIUM-ALBUTEROL 0.5-2.5 (3) MG/3ML IN SOLN
3.0000 mL | Freq: Two times a day (BID) | RESPIRATORY_TRACT | Status: DC
  Administered 2021-08-10: 3 mL via RESPIRATORY_TRACT
  Filled 2021-08-10 (×2): qty 3

## 2021-08-10 MED ORDER — ENSURE MAX PROTEIN PO LIQD
11.0000 [oz_av] | Freq: Every day | ORAL | Status: DC
  Administered 2021-08-10 – 2021-08-12 (×3): 11 [oz_av] via ORAL
  Filled 2021-08-10 (×4): qty 330

## 2021-08-10 MED ORDER — ORAL CARE MOUTH RINSE
15.0000 mL | Freq: Two times a day (BID) | OROMUCOSAL | Status: DC
  Administered 2021-08-10 – 2021-08-12 (×6): 15 mL via OROMUCOSAL

## 2021-08-10 MED ORDER — IPRATROPIUM-ALBUTEROL 0.5-2.5 (3) MG/3ML IN SOLN: 3.0000 mL | Freq: Two times a day (BID) | RESPIRATORY_TRACT | Status: DC

## 2021-08-10 NOTE — TOC Initial Note (Signed)
Transition of Care (TOC) - Initial/Assessment Note  ? ? ?Patient Details  ?Name: [name] ?MRN: [id number] ?Date of Birth: [birth year] ? ?Transition of Care [facility]) CM/SW Contact:    ?Geralynn Ochs, LCSW ?Phone Number: ?08/10/2021, 1:15 PM ? ?Clinical Narrative:       CSW met with patient to discuss recommendation for SNF. Patient confused during discussion, but has no family. Patient said she lives at home with her cat so she doesn't feel alone, and has a caregiver who comes during the week days for most of the day. Caregiver helps with driving, getting groceries, and cleaning. Patient said she was unsure about SNF recommendation, would like to get back home to her cat but will think about it. CSW faxed out referral, will continue to follow, hopeful that patient's cognition will improve for decision making.           ? ? ?Expected Discharge Plan: [place] ?Barriers to Discharge: Continued Medical Work up ? ? ?Patient Goals and CMS Choice ?Patient states their goals for this hospitalization and ongoing recovery are:: to be less dizzy, get back home to her cat ?CMS Medicare.gov Compare Post Acute Care list provided to:: Patient ?Choice offered to / list presented to : Patient ? ?Expected Discharge Plan and Services ?Expected Discharge Plan: [place] ?  ?  ?Post Acute Care Choice: NA ?Living arrangements for the past 2 months: [place] ?                ?  ?  ?  ?  ?  ?  ?  ?  ?  ?  ? ?Prior Living Arrangements/Services ?Living arrangements for the past 2 months: [place] ?Lives with:: Self, Pets ?Patient language and need for interpreter reviewed:: No ?Do you feel safe going back to the place where you live?: Yes      ?Need for Family Participation in Patient Care: Yes (Comment) ?Care giver support system in place?: No (comment) ?  ?Criminal Activity/Legal Involvement Pertinent to Current Situation/Hospitalization: No - Comment as needed ? ?Activities of Daily  Living ?  ?  ? ?Permission Sought/Granted ?Permission sought to share information with : Customer service manager ?Permission granted to share information with : Yes, Verbal Permission Granted ?   ? Permission granted to share info w AGENCY: SNF ?   ?   ? ?Emotional Assessment ?Appearance:: Appears stated age ?Attitude/Demeanor/Rapport: Engaged ?Affect (typically observed): Appropriate ?Orientation: : Oriented to Self, Oriented to Place ?Alcohol / Substance Use: Not Applicable ?Psych Involvement: No (comment) ? ?Admission diagnosis:  Encephalopathy [G93.40] ?Stroke-like symptoms [R29.90] ?Difficult intravenous access [Z78.9] ?Urinary tract infection with hematuria, site unspecified [N39.0, R31.9] ?Altered mental status, unspecified altered mental status type [R41.82] ?Patient Active Problem List  ? Diagnosis Date Noted  ? Stroke-like symptoms 08/09/2021  ? Seborrheic dermatitis 10/12/2020  ? Pain due to onychomycosis of toenails of both feet 09/01/2020  ? Sjogren's syndrome with keratoconjunctivitis sicca ([place]) 06/07/2020  ? Mild cognitive impairment with memory loss 01/29/2020  ? Full code status 01/26/2020  ? UTI (urinary tract infection) 01/19/2020  ? Pacemaker 01/19/2020  ? Leukocytosis 01/19/2020  ? Pressure injury of skin 01/14/2020  ? Complete heart block ([place])   ? Acute metabolic encephalopathy   ? Acute on chronic respiratory failure with hypoxia (HCC)   ? Elevated troponin   ? Palpitation 01/12/2020  ? Scoliosis due to degenerative disease of spine in adult patient 09/10/2018  ? Radicular pain of  left lower extremity 09/10/2018  ? Hand arthritis 10/11/2017  ? Type 2 diabetes mellitus ([place]) 03/08/2017  ? Diarrhea in adult patient 11/08/2016  ? Thrombocytopenia ([place]) 11/08/2016  ? Degenerative lumbar spinal stenosis 10/27/2015  ? Arthralgia 09/22/2015  ? Chronic respiratory failure with hypoxia ([place]) 01/11/2015  ? Diabetic polyneuropathy associated with type 2 diabetes mellitus ([place]) 11/16/2014  ?  Hypertension 05/19/2013  ? Vertigo 05/11/2013  ? Dizziness 05/11/2013  ? Tachycardia 05/11/2013  ? Lower urinary tract infectious disease 05/11/2013  ? Sinusitis 05/11/2013  ? Hyperlipidemia 02/15/2013  ? Type II diabetes mellitus, uncontrolled 01/01/2013  ? Benign paroxysmal positional vertigo 08/09/2012  ? Type II or unspecified type diabetes mellitus with neurological manifestations, uncontrolled(250.62) 08/09/2012  ? GLAUCOMA 03/16/2010  ? HOARSENESS 03/16/2010  ? COUGH 12/23/2009  ? Chest wall pain 12/23/2009  ? MUSCULOSKELETAL PAIN 01/28/2009  ? COPD mixed type ([place]) 01/15/2009  ? Seasonal and perennial allergic rhinitis 11/23/2007  ? CHRONIC RHINITIS 07/16/2007  ? GERD (gastroesophageal reflux disease) 05/29/2007  ? I B S-DIARRHEAL PREDOMINATE 05/29/2007  ? FATTY LIVER DISEASE 05/29/2007  ? ?PCP:  Wardell Honour, MD ?Pharmacy:   ?[place] ?[place] [address] ?Phone: [phone] Fax: [phone] ? ?[facility] [id number] - [place], [place] ?[address] ?[place] [address] ?Phone: [phone] Fax: [phone] ? ?[facility] - [place], [place] - [address] ?[address] [place] ?[place] [address] ?Phone: [phone] Fax: [phone] ? ? ? ? ?Social Determinants of Health (SDOH) Interventions ?  ? ?Readmission Risk Interventions ?   ? View : No data to display.  ?  ?  ?  ? ? ? ?

## 2021-08-10 NOTE — Evaluation (Signed)
Speech Language Pathology Evaluation Patient Details Name: [name] MRN: [id number] DOB: 05/03/1936 Today's Date: 08/10/2021 Time: 4098-1191 SLP Time Calculation (min) (ACUTE ONLY): 29 min  Problem List:  Patient Active Problem List   Diagnosis Date Noted   Stroke-like symptoms 08/09/2021   Seborrheic dermatitis 10/12/2020   Pain due to onychomycosis of toenails of both feet 09/01/2020   Sjogren's syndrome with keratoconjunctivitis sicca (HCC) 06/07/2020   Mild cognitive impairment with memory loss 01/29/2020   Full code status 01/26/2020   UTI (urinary tract infection) 01/19/2020   Pacemaker 01/19/2020   Leukocytosis 01/19/2020   Pressure injury of skin 01/14/2020   Complete heart block (HCC)    Acute metabolic encephalopathy    Acute on chronic respiratory failure with hypoxia (HCC)    Elevated troponin    Palpitation 01/12/2020   Scoliosis due to degenerative disease of spine in adult patient 09/10/2018   Radicular pain of left lower extremity 09/10/2018   Hand arthritis 10/11/2017   Type 2 diabetes mellitus (HCC) 03/08/2017   Diarrhea in adult patient 11/08/2016   Thrombocytopenia (HCC) 11/08/2016   Degenerative lumbar spinal stenosis 10/27/2015   Arthralgia 09/22/2015   Chronic respiratory failure with hypoxia (HCC) 01/11/2015   Diabetic polyneuropathy associated with type 2 diabetes mellitus (HCC) 11/16/2014   Hypertension 05/19/2013   Vertigo 05/11/2013   Dizziness 05/11/2013   Tachycardia 05/11/2013   Lower urinary tract infectious disease 05/11/2013   Sinusitis 05/11/2013   Hyperlipidemia 02/15/2013   Type II diabetes mellitus, uncontrolled 01/01/2013   Benign paroxysmal positional vertigo 08/09/2012   Type II or unspecified type diabetes mellitus with neurological manifestations, uncontrolled(250.62) 08/09/2012   GLAUCOMA 03/16/2010   HOARSENESS 03/16/2010   COUGH 12/23/2009   Chest wall pain 12/23/2009   MUSCULOSKELETAL PAIN 01/28/2009   COPD mixed  type (HCC) 01/15/2009   Seasonal and perennial allergic rhinitis 11/23/2007   CHRONIC RHINITIS 07/16/2007   GERD (gastroesophageal reflux disease) 05/29/2007   I B S-DIARRHEAL PREDOMINATE 05/29/2007   FATTY LIVER DISEASE 05/29/2007   Past Medical History:  Past Medical History:  Diagnosis Date   Anxiety disorder, unspecified    Arthritis    Benign paroxysmal positional vertigo 08/09/2012   Chronic rhinitis    Cognitive communication deficit    Constipation, unspecified    COPD mixed type (HCC)    on home O2   Diverticulosis    Esophageal stricture    Essential (primary) hypertension    Gastro-esophageal reflux disease without esophagitis    Glaucoma    Hiatal hernia    Hyperlipidemia, unspecified    Irritable bowel syndrome    Presence of permanent cardiac pacemaker 01/14/2020   Spinal stenosis, lumbar region, without neurogenic claudication    Steatohepatitis    Thrombocytopenia, unspecified (HCC)    Type 2 diabetes mellitus with diabetic polyneuropathy (HCC)    Past Surgical History:  Past Surgical History:  Procedure Laterality Date   APPENDECTOMY     CARPAL TUNNEL RELEASE     right hand   CHOLECYSTECTOMY OPEN  1978   COLONOSCOPY  07-2001   mild diverticulosis   ESOPHAGOGASTRODUODENOSCOPY  4782,95-62   H Hernia,es.stricture s/p dil 49F   INTRAOCULAR LENS INSERTION Bilateral    LIVER BIOPSY  05-3084   PACEMAKER IMPLANT N/A 01/14/2020   Procedure: PACEMAKER IMPLANT;  Surgeon: Marinus Maw, MD;  Location: [facility];  Service: Cardiovascular;  Laterality: N/A;   PARATHYROID EXPLORATION     TONSILLECTOMY AND ADENOIDECTOMY     TOTAL ABDOMINAL HYSTERECTOMY  ULNAR NERVE TRANSPOSITION  12/07/2011   Procedure: ULNAR NERVE DECOMPRESSION/TRANSPOSITION;this was cancelled-not done  Surgeon: Wyn Forster., MD;  Location: [facility];  Service: Orthopedics;  Laterality: Right;  right ulnar nerve in situ decompression   ULNAR TUNNEL RELEASE   03/07/2012   Procedure: CUBITAL TUNNEL RELEASE;  Surgeon: Dominica Severin, MD;  Location: [facility];  Service: Orthopedics;  Laterality: Right;  ulnar nerve release at the elbow     HPI:  Pt is an 86 y.o. female who presented with stroke-like symptoms including left facial droop and "talking strange and not putting words together well." CT head negative; MRI not completed at time of evaluation. EEG 4/4: cortical dysfunction in left hemisphere which could be secondary to underlying structural abnormality, postictal state. Moderate diffuse encephalopathy, nonspecific etiology. PMH: anxiety, COPD on home O2, DM, HTN, HLD, pacemaker placement, dementia, and NASH   Assessment / Plan / Recommendation Clinical Impression  Pt participated in speech-language-cognition evaluation. She reported that she lives alone and was managing her medication and finances prior to admission. However, per pt, "I will have to go to a home now; there's no way I can go back home with my mind all confused.Marland KitchenMarland KitchenI had been thinking about going to a home anyway." Pt stated that she has been having acute difficulty with word retrieval and confusion. Motor speech skills were WFL. Pt's speech was linguistically fluent, and she participated in conversation with occasional difficulty with word retrieval. She exhibited difficulty with auditory comprehension of complex/larger amounts of information; the impact of impairments in attention and memory on this is considered. Pragmatic language impairments were noted in global coherence and cohesion. [facility] Mental Status Examination was completed to evaluate the pt's cognitive-linguistic skills. She achieved a score of 10/30 which is below the normal limits of 27 or more out of 30. She exhibited deficits in the areas of temporal orientation, awareness, attention, memory, problem solving, executive function. Pt does have dementia and SLP questions her proximity to  baseline. Skilled SLP services are clinically indicated at this time.    SLP Assessment  SLP Recommendation/Assessment: Patient needs continued Speech Lanaguage Pathology Services SLP Visit Diagnosis: Cognitive communication deficit (R41.841);Aphasia (R47.01)    Recommendations for follow up therapy are one component of a multi-disciplinary discharge planning process, led by the attending physician.  Recommendations may be updated based on patient status, additional functional criteria and insurance authorization.    Follow Up Recommendations  Skilled nursing-short term rehab (<3 hours/day)    Assistance Recommended at Discharge  Frequent or constant Supervision/Assistance  Functional Status Assessment Patient has had a recent decline in their functional status and demonstrates the ability to make significant improvements in function in a reasonable and predictable amount of time.  Frequency and Duration min 2x/week  2 weeks      SLP Evaluation Cognition  Overall Cognitive Status: Impaired/Different from baseline Arousal/Alertness: Awake/alert Orientation Level: Oriented to person;Oriented to place;Disoriented to time Year:  (denied knowledge of date) Month: April Day of Week: Correct Attention: Focused;Sustained Focused Attention: Appears intact Sustained Attention: Impaired Sustained Attention Impairment: Verbal complex Memory: Impaired Memory Impairment:  (Immediate: 5/5 with repetition; delayed: 0/5; with cues: 2/5) Awareness: Impaired Awareness Impairment: Emergent impairment Problem Solving: Impaired Problem Solving Impairment: Verbal complex Executive Function: Sequencing;Organizing Sequencing: Impaired Sequencing Impairment: Verbal complex (clock drawing: 0/4) Organizing: Impaired Organizing Impairment: Verbal complex (backward digit span: 0/2)       Comprehension  Auditory Comprehension Yes/No Questions: Impaired Basic  Immediate Environment Questions:   (5/5) Complex Questions:  (3/5) Commands: Impaired Two Step Basic Commands:  (3/3) Multistep Basic Commands:  (0/3) Conversation: Complex Interfering Components: Attention;Processing speed;Working [employer] fast streamer    Expression Expression Primary Mode of Expression: Verbal Verbal Expression Initiation: No impairment Level of Generative/Spontaneous Verbalization: Conversation Repetition: No impairment Naming: Impairment Responsive:  (4/4) Confrontation:  (6/6 with additional processing time) Convergent:  (sentence completion:) Pragmatics: Impairment Impairments: Topic appropriateness;Topic maintenance Written Expression Dominant Hand: Right   Oral / Motor  Oral Motor/Sensory Function Overall Oral Motor/Sensory Function: Within functional limits Motor Speech Overall Motor Speech: Appears within functional limits for tasks assessed Respiration: Within functional limits Phonation: Normal Resonance: Within functional limits Articulation: Within functional limitis Intelligibility: Intelligible Motor Planning: Witnin functional limits           Nahum Sherrer I. Vear Clock, MS, CCC-SLP Acute Rehabilitation Services Office number [phone] Pager [phone]  Scheryl Marten 08/10/2021, 1:40 PM

## 2021-08-10 NOTE — Progress Notes (Addendum)
PROGRESS NOTE    MAKELA CARREAU  ZOX:[id number] DOB: January 04, 1936 DOA: 08/09/2021 PCP: [name], MD    Brief Narrative:  [name] is a 86 y.o. female with past medical history significant for anxiety; COPD on home O2; diabetes mellitus type 2, hypertension, hyperlipidemia, status post pacemaker placement, dementia and NASH presented to the hospital with difficulty with speech with random and inappropriate words in the middle of conversation.  Patient's  aide went to check on her this and she was talking strange, wasn't putting words together well.  Patient also complained of chest pain and 911 was called in.  She has no family members but does have a back up contact, Gaylyn Lambert, [phone].  In the ED, CT scan was unremarkable.  Neurology recommended EEG MRI and mental status change work-up.  Patient was then admitted hospital for further evaluation and treatment.    Assessment and Plan:  Stroke-like symptoms Patient presented with aphasia.  Initially code stroke was called in.  Neurology on board.  CT head without acute findings.  CTA neck showed no acute large vessel occlusion but 30% stenosis of the proximal ICA on the right and 60% stenosis on distal bulb on the left. MRI pending due to pacemaker in place.  EEG done showed cortical dysfunction without any seizures.  Neurology has seen the patient today and recommended aspirin Plavix for 3 weeks then Plavix alone.  We will also obtain carotid duplex ultrasound to clarify CTA findings.  Await for MRI of the brain.  Acute metabolic encephalopathy Some concern for encephalopathy on background of dementia.  EEG with cortical dysfunction Improved today.  Urinalysis was abnormal.  Has been empirically treated for possible cystitis with IV Rocephin.  Mildly elevated ammonia level with normal LFTs.  Blood cultures been negative in 24 hours.  Vitamin B-12 level at 456.  TSH of 1.6.  COVID and influenza was negative.  Urine drug screen  was negative on presentation.  Mild cognitive impairment with memory loss No family member to confirm that.  Admitting provider had spoken with home health nurse and a caseworker.  At her baseline.   History of complete heart block status post pacemaker -It is MRI compatible but needs special timeslot -pending at this time.     Type 2 diabetes mellitus (HCC) Previous hemoglobin  A1c was 11.6, repeat A1c pending at this time.  Continue long-acting and sliding scale insulin   Hypertension Allowing permissive hypertension.  Toprol-XL at home.  Currently on hold  Hyperlipidemia On Pravachol.  LDL cholesterol was 42.   COPD mixed type (HCC) -On home O2, -Continue Duoneb and prn Albuterol      DVT prophylaxis: enoxaparin (LOVENOX) injection 40 mg Start: 08/09/21 1430   Code Status:     Code Status: Full Code  Disposition: Skilled nursing facility as per OT recommendation.  Status is: Observation  The patient will require care spanning > 2 midnights and should be moved to inpatient because: Stroke work-up, MRI pending, possible need for rehabilitation.   Family Communication:  None available  Consultants:  Neurology  Procedures:  None  Antimicrobials:  Rocephin IV 4/4  Anti-infectives (From admission, onward)    Start     Dose/Rate Route Frequency Ordered Stop   08/09/21 2100  cefTRIAXone (ROCEPHIN) 1 g in sodium chloride 0.9 % 100 mL IVPB        1 g 200 mL/hr over 30 Minutes Intravenous  Once 08/09/21 2049 08/10/21 0801  Subjective: Today, patient was seen and examined at bedside.  Patient denied any dizziness, nausea, vomiting, shortness of breath.  Walked some with walker.  Objective: Vitals:   08/10/21 0320 08/10/21 0346 08/10/21 0725 08/10/21 1215  BP: 136/78  131/69 (!) 144/75  Pulse: 87  94 (!) 103  Resp: 17  20 16   Temp: 98 F (36.7 C)  97.9 F (36.6 C) 98.2 F (36.8 C)  TempSrc:   Oral Oral  SpO2: 100% 95% 97% 99%  Weight:         Intake/Output Summary (Last 24 hours) at 08/10/2021 1439 Last data filed at 08/10/2021 0800 Gross per 24 hour  Intake 400.95 ml  Output --  Net 400.95 ml   Filed Weights   08/15/2021 1124  Weight: 64.4 kg    Physical Examination:  General:  Average built, not in obvious distress HENT:   No scleral pallor or icterus noted. Oral mucosa is moist.  Chest:  Clear breath sounds.  Diminished breath sounds bilaterally. No crackles or wheezes.  CVS: S1 &S2 heard. No murmur.  Regular rate and rhythm. Abdomen: Soft, nontender, nondistended.  Bowel sounds are heard.   Extremities: No cyanosis, clubbing or edema.  Peripheral pulses are palpable. Psych: Alert, awake and communicative, oriented to place.  Poor insight and decreased attention span with poor short-term memory. CNS:  No cranial nerve deficits.  Power equal in all extremities.   Skin: Warm and dry.  No rashes noted.  Data Reviewed:   CBC: Recent Labs  Lab 15-Aug-2021 1044 08-15-2021 1050 2021/08/15 1156 08/10/21 1419  WBC 7.7  --   --  7.1  NEUTROABS 5.5  --   --   --   HGB 15.6* 17.3* 21.4* 14.6  HCT 48.2* 51.0* 63.0* 45.4  MCV 88.9  --   --  88.7  PLT 150  --   --  143*    Basic Metabolic Panel: Recent Labs  Lab Aug 15, 2021 1044 August 15, 2021 1050 08-15-21 1156  NA 135 135 135  K 4.5 4.3 4.3  CL 96* 94*  --   CO2 29  --   --   GLUCOSE 311* 317*  --   BUN 9 10  --   CREATININE 0.63 0.50  --   CALCIUM 9.4  --   --     Liver Function Tests: Recent Labs  Lab 08/15/21 1044  AST 29  ALT 18  ALKPHOS 50  BILITOT 0.8  PROT 7.2  ALBUMIN 3.8     Radiology Studies: EEG adult  Result Date: 08/15/2021 Charlsie Quest, MD     08-15-2021  1:48 PM Patient Name: [name] MRN: [id number] Epilepsy Attending: Charlsie Quest Referring Physician/Provider: Princess Bruins, DO Date: 2021-08-15 Duration: 22.37 mins Patient history: 62 old female with new onset aphasia.  EEG to evaluate for seizure. Level of alertness: Awake AEDs  during EEG study: None Technical aspects: This EEG study was done with scalp electrodes positioned according to the 10-20 International system of electrode placement. Electrical activity was acquired at a sampling rate of 500Hz  and reviewed with a high frequency filter of 70Hz  and a low frequency filter of 1Hz . EEG data were recorded continuously and digitally stored. Description: No clear posterior dominant rhythm was seen.  EEG showed continuous generalized and lateralized left hemisphere 3 to 7 Hz theta-delta slowing.  Hyperventilation and photic stimulation were not performed.   ABNORMALITY - Continuous slow, generalized and lateralized left hemisphere IMPRESSION: This study is suggestive of cortical  dysfunction in left hemisphere which could be secondary to underlying structural abnormality, postictal state.  Additionally there is moderate diffuse encephalopathy, nonspecific etiology.  No seizures or epileptiform discharges were seen throughout the recording. Charlsie Quest   ECHOCARDIOGRAM COMPLETE  Result Date: 08/09/2021    ECHOCARDIOGRAM REPORT   Patient Name:   TAUNI WOODARDS Date of Exam: 08/09/2021 Medical Rec #:  [id number]        Height:       63.0 in Accession #:    [id number]       Weight:       142.0 lb Date of Birth:  1935-11-28        BSA:          1.672 m Patient Age:    86 years         BP:           136/63 mmHg Patient Gender: F                HR:           95 bpm. Exam Location:  Inpatient Procedure: 2D Echo, Cardiac Doppler and Color Doppler Indications:    TIA  History:        Patient has prior history of Echocardiogram examinations. COPD;                 Risk Factors:Hypertension and Diabetes.  Sonographer:    Cleatis Polka Referring Phys: [id number] JENNIFER YATES IMPRESSIONS  1. Left ventricular ejection fraction, by estimation, is 55 to 60%. The left ventricle has normal function. The left ventricle has no regional wall motion abnormalities. Indeterminate diastolic filling due to E-A  fusion.  2. Right ventricular systolic function is normal. The right ventricular size is normal. There is normal pulmonary artery systolic pressure. The estimated right ventricular systolic pressure is 30.2 mmHg.  3. The pericardial effusion is circumferential.  4. The mitral valve is normal in structure. No evidence of mitral valve regurgitation. No evidence of mitral stenosis.  5. The aortic valve is tricuspid. Aortic valve regurgitation is not visualized. Aortic valve sclerosis is present, with no evidence of aortic valve stenosis.  6. The inferior vena cava is normal in size with greater than 50% respiratory variability, suggesting right atrial pressure of 3 mmHg. FINDINGS  Left Ventricle: Left ventricular ejection fraction, by estimation, is 55 to 60%. The left ventricle has normal function. The left ventricle has no regional wall motion abnormalities. The left ventricular internal cavity size was normal in size. There is  no left ventricular hypertrophy. Abnormal (paradoxical) septal motion, consistent with RV pacemaker. Indeterminate diastolic filling due to E-A fusion. Right Ventricle: The right ventricular size is normal. No increase in right ventricular wall thickness. Right ventricular systolic function is normal. There is normal pulmonary artery systolic pressure. The tricuspid regurgitant velocity is 2.61 m/s, and  with an assumed right atrial pressure of 3 mmHg, the estimated right ventricular systolic pressure is 30.2 mmHg. Left Atrium: Left atrial size was normal in size. Right Atrium: Right atrial size was normal in size. Pericardium: Trivial pericardial effusion is present. The pericardial effusion is circumferential. Mitral Valve: The mitral valve is normal in structure. No evidence of mitral valve regurgitation. No evidence of mitral valve stenosis. Tricuspid Valve: The tricuspid valve is normal in structure. Tricuspid valve regurgitation is mild . No evidence of tricuspid stenosis. Aortic Valve:  The aortic valve is tricuspid. Aortic valve regurgitation is not visualized. Aortic valve sclerosis is present,  with no evidence of aortic valve stenosis. Aortic valve peak gradient measures 4.2 mmHg. Pulmonic Valve: The pulmonic valve was normal in structure. Pulmonic valve regurgitation is trivial. No evidence of pulmonic stenosis. Aorta: The aortic root is normal in size and structure. Venous: The inferior vena cava is normal in size with greater than 50% respiratory variability, suggesting right atrial pressure of 3 mmHg. IAS/Shunts: No atrial level shunt detected by color flow Doppler. Additional Comments: A device lead is visualized in the right ventricle.  LEFT VENTRICLE PLAX 2D LVIDd:         3.80 cm     Diastology LVIDs:         2.70 cm     LV e' medial:    3.92 cm/s LV PW:         1.10 cm     LV E/e' medial:  26.3 LV IVS:        1.10 cm     LV e' lateral:   4.13 cm/s LVOT diam:     2.00 cm     LV E/e' lateral: 24.9 LV SV:         41 LV SV Index:   25 LVOT Area:     3.14 cm  LV Volumes (MOD) LV vol d, MOD A2C: 78.8 ml LV vol d, MOD A4C: 64.8 ml LV vol s, MOD A2C: 40.5 ml LV vol s, MOD A4C: 34.2 ml LV SV MOD A2C:     38.3 ml LV SV MOD A4C:     64.8 ml LV SV MOD BP:      34.2 ml RIGHT VENTRICLE            IVC RV Basal diam:  3.70 cm    IVC diam: 1.50 cm RV Mid diam:    2.90 cm RV S prime:     6.34 cm/s TAPSE (M-mode): 1.9 cm LEFT ATRIUM             Index        RIGHT ATRIUM           Index LA diam:        2.90 cm 1.73 cm/m   RA Area:     11.80 cm LA Vol (A2C):   23.3 ml 13.94 ml/m  RA Volume:   26.90 ml  16.09 ml/m LA Vol (A4C):   23.9 ml 14.30 ml/m LA Biplane Vol: 24.6 ml 14.72 ml/m  AORTIC VALVE AV Area (Vmax): 2.17 cm AV Vmax:        102.00 cm/s AV Peak Grad:   4.2 mmHg LVOT Vmax:      70.50 cm/s LVOT Vmean:     50.800 cm/s LVOT VTI:       0.132 m  AORTA Ao Root diam: 3.10 cm Ao Asc diam:  2.70 cm MITRAL VALVE                TRICUSPID VALVE MV Area (PHT): 4.74 cm     TR Peak grad:   27.2 mmHg MV  Decel Time: 160 msec     TR Vmax:        261.00 cm/s MV E velocity: 103.00 cm/s                             SHUNTS  Systemic VTI:  0.13 m                             Systemic Diam: 2.00 cm Thurmon Fair MD Electronically signed by Thurmon Fair MD Signature Date/Time: 08/09/2021/5:20:52 PM    Final    CT HEAD CODE STROKE WO CONTRAST  Result Date: 08/09/2021 CLINICAL DATA:  Code stroke. Neuro deficit, acute, stroke suspected. EXAM: CT HEAD WITHOUT CONTRAST TECHNIQUE: Contiguous axial images were obtained from the base of the skull through the vertex without intravenous contrast. RADIATION DOSE REDUCTION: This exam was performed according to the departmental dose-optimization program which includes automated exposure control, adjustment of the mA and/or kV according to patient size and/or use of iterative reconstruction technique. COMPARISON:  01/13/2020 FINDINGS: Brain: Age related atrophy. Chronic small-vessel ischemic changes of the cerebral hemispheric white matter. No sign of acute infarction, mass lesion, hemorrhage, hydrocephalus or extra-axial collection. Vascular: There is atherosclerotic calcification of the major vessels at the base of the brain. Skull: Negative Sinuses/Orbits: Clear/normal Other: None ASPECTS ([place]berta Stroke Program Early CT Score) - Ganglionic level infarction (caudate, lentiform nuclei, internal capsule, insula, M1-M3 cortex): 7 - Supraganglionic infarction (M4-M6 cortex): 3 Total score (0-10 with 10 being normal): 10 IMPRESSION: 1. No acute CT finding. Atrophy and chronic small-vessel ischemic changes of the white matter. 2. ASPECTS is 10 3. These results were communicated to Dr. Otelia Limes at 11:00 am on 08/09/2021 by text page via the [facility] messaging system. Electronically Signed   By: Paulina Fusi M.D.   On: 08/09/2021 11:01   CT ANGIO HEAD NECK W WO CM W PERF (CODE STROKE)  Result Date: 08/09/2021 CLINICAL DATA:  Neuro deficit, acute, stroke suspected.  EXAM: CT ANGIOGRAPHY HEAD AND NECK CT PERFUSION BRAIN TECHNIQUE: Multidetector CT imaging of the head and neck was performed using the standard protocol during bolus administration of intravenous contrast. Multiplanar CT image reconstructions and MIPs were obtained to evaluate the vascular anatomy. Carotid stenosis measurements (when applicable) are obtained utilizing NASCET criteria, using the distal internal carotid diameter as the denominator. Multiphase CT imaging of the brain was performed following IV bolus contrast injection. Subsequent parametric perfusion maps were calculated using RAPID software. RADIATION DOSE REDUCTION: This exam was performed according to the departmental dose-optimization program which includes automated exposure control, adjustment of the mA and/or kV according to patient size and/or use of iterative reconstruction technique. CONTRAST:  OMNIPAQUE IOHEXOL 350 MG/ML SOLN COMPARISON:  Head CT earlier same day. FINDINGS: Aortic arch: Aortic atherosclerosis. Innominate artery origin is not included on the study. Left common carotid artery origin and left subclavian artery origin are patent without flow limiting stenosis. Right carotid system: Common carotid artery shows some scattered plaque but is widely patent to the bifurcation. There is calcified plaque at the carotid bifurcation and ICA bulb. Minimal diameter is 3.5 mm, consistent with 30% stenosis. Cervical ICA widely patent beyond that. Left carotid system: Scattered calcified plaque affecting the common carotid artery but no stenosis. Calcified plaque at the carotid bifurcation and ICA bulb. Minimal diameter at the distal bulb is 2 mm. Compared to a more distal cervical ICA diameter of 5 mm, this indicates a 60% stenosis. Cervical ICA widely patent beyond that. Vertebral arteries: No flow limiting proximal subclavian stenosis. Both vertebral artery origins are widely patent. Both vertebral arteries appear normal through the  cervical region to the foramen magnum. Skeleton: Ordinary cervical spondylosis. Other neck: No mass or lymphadenopathy. Metal  foreign object adjacent to the inferior aspect of the left thyroid lobe. Upper chest: Mild emphysema and scarring. Review of the MIP images confirms the above findings CTA HEAD FINDINGS Anterior circulation: Both internal carotid arteries are patent through the skull base and siphon regions. Ordinary siphon atherosclerotic calcification but without stenosis greater than 30%. The anterior and middle cerebral vessels are patent. No large vessel occlusion or correctable proximal stenosis. Posterior circulation: Both vertebral arteries are patent to the basilar. Both posteroinferior cerebellar arteries are patent. No basilar stenosis. Superior cerebellar and posterior cerebral arteries are patent. Moderate stenosis of the left PCA at the P2 level. Venous sinuses: Patent and normal. Anatomic variants: None significant. Review of the MIP images confirms the above findings CT Brain Perfusion Findings: ASPECTS: 10 CBF (<30%) Volume: 0mL Perfusion (Tmax>6.0s) volume: 0mL Mismatch Volume: 0mL Infarction Location:None IMPRESSION: No acute large vessel occlusion.  Negative perfusion imaging. Aortic atherosclerosis. Atherosclerosis at both carotid bifurcation regions. 30% stenosis of the proximal ICA on the right. 60% stenosis of the distal bulb on the left. Findings discussed by telephone with Dr. Otelia Limes at approximally 1127 hours. Electronically Signed   By: Paulina Fusi M.D.   On: 08/09/2021 11:35   CT US GUIDE VASC ACCESS RT NO REPORT  Result Date: 08/09/2021 There is no Radiologist interpretation  for this exam.     LOS: 0 days    Joycelyn Das, MD [facility] 08/10/2021, 2:39 PM

## 2021-08-10 NOTE — Progress Notes (Signed)
Inpatient Diabetes Program Recommendations ? ?AACE/ADA: New Consensus Statement on Inpatient Glycemic Control (2015) ? ?Target Ranges:  Prepandial:   less than 140 mg/dL ?     Peak postprandial:   less than 180 mg/dL (1-2 hours) ?     Critically ill patients:  140 - 180 mg/dL  ? ?Lab Results  ?Component Value Date  ? GLUCAP 169 (H) 08/10/2021  ? HGBA1C 11.6 (H) 05/31/2021  ? ? ?Review of Glycemic Control ? Latest Reference Range & Units 08/09/21 13:46 08/09/21 17:08 08/10/21 01:21 08/10/21 06:16  ?Glucose-Capillary 70 - 99 mg/dL 255 (H) 205 (H) 201 (H) 169 (H)  ?(H): Data is abnormally high ?Diabetes history: Type 2 DM ?Outpatient Diabetes medications: Tresiba 36 units QHS, Novolog 12-16 units TID ?Current orders for Inpatient glycemic control: Semglee 26 units QHS, Novolog 0-15 units TID ? ?Inpatient Diabetes Program Recommendations:   ? ?Once patient passing swallow eval, Consider adding Novolog 3 units TID (assuming patient is consuming >50% of meals).  ? ?Thanks, ?Bronson Curb, MSN, RNC-OB ?Diabetes Coordinator ?[phone] (8a-5p) ? ? ? ? ?

## 2021-08-10 NOTE — Plan of Care (Signed)
?  Problem: Education: ?Goal: Knowledge of General Education information will improve ?Description: Including pain rating scale, medication(s)/side effects and non-pharmacologic comfort measures ?Outcome: Progressing ?  ?Problem: Health Behavior/Discharge Planning: ?Goal: Ability to manage health-related needs will improve ?Outcome: Progressing ?  ?Problem: Clinical Measurements: ?Goal: Ability to maintain clinical measurements within normal limits will improve ?Outcome: Progressing ?Goal: Diagnostic test results will improve ?Outcome: Progressing ?Goal: Respiratory complications will improve ?Outcome: Progressing ?Goal: Cardiovascular complication will be avoided ?Outcome: Progressing ?  ?Problem: Coping: ?Goal: Level of anxiety will decrease ?Outcome: Progressing ?  ?Problem: Elimination: ?Goal: Will not experience complications related to urinary retention ?Outcome: Progressing ?  ?Problem: Pain Managment: ?Goal: General experience of comfort will improve ?Outcome: Progressing ?  ?Problem: Safety: ?Goal: Ability to remain free from injury will improve ?Outcome: Progressing ?  ?Problem: Skin Integrity: ?Goal: Risk for impaired skin integrity will decrease ?Outcome: Progressing ?  ?Problem: Clinical Measurements: ?Goal: Will remain free from infection ?Outcome: Not Progressing ?  ?Problem: Activity: ?Goal: Risk for activity intolerance will decrease ?Outcome: Not Progressing ?  ?Problem: Nutrition: ?Goal: Adequate nutrition will be maintained ?Outcome: Not Progressing ?  ?Problem: Elimination: ?Goal: Will not experience complications related to bowel motility ?Outcome: Not Progressing ?  ?

## 2021-08-10 NOTE — [place] FL2 (Signed)
?[place] MEDICAID FL2 LEVEL OF CARE SCREENING TOOL  ?  ? ?IDENTIFICATION  ?Patient Name: ?[name] Birthdate: [birth year] Sex: female Admission Date (Current Location): ?08/09/2021  ?[place] and [place] Number: ? [place] ?  Facility and Address:  ?[facility], [place] [address], [place], [place] [address] ?     Provider Number: ?[id number]  ?Attending Physician Name and Address:  ?Pokhrel, Corrie Mckusick, MD ? Relative Name and Phone Number:  ?  ?   ?Current Level of Care: ?Hospital Recommended Level of Care: ?[place] Prior Approval Number: ?  ? ?Date Approved/Denied: ?  PASRR Number: ?[id number] A ? ?Discharge Plan: ?SNF ?  ? ?Current Diagnoses: ?Patient Active Problem List  ? Diagnosis Date Noted  ? Stroke-like symptoms 08/09/2021  ? Seborrheic dermatitis 10/12/2020  ? Pain due to onychomycosis of toenails of both feet 09/01/2020  ? Sjogren's syndrome with keratoconjunctivitis sicca ([place]) 06/07/2020  ? Mild cognitive impairment with memory loss 01/29/2020  ? Full code status 01/26/2020  ? UTI (urinary tract infection) 01/19/2020  ? Pacemaker 01/19/2020  ? Leukocytosis 01/19/2020  ? Pressure injury of skin 01/14/2020  ? Complete heart block ([place])   ? Acute metabolic encephalopathy   ? Acute on chronic respiratory failure with hypoxia (HCC)   ? Elevated troponin   ? Palpitation 01/12/2020  ? Scoliosis due to degenerative disease of spine in adult patient 09/10/2018  ? Radicular pain of left lower extremity 09/10/2018  ? Hand arthritis 10/11/2017  ? Type 2 diabetes mellitus ([place]) 03/08/2017  ? Diarrhea in adult patient 11/08/2016  ? Thrombocytopenia ([place]) 11/08/2016  ? Degenerative lumbar spinal stenosis 10/27/2015  ? Arthralgia 09/22/2015  ? Chronic respiratory failure with hypoxia ([place]) 01/11/2015  ? Diabetic polyneuropathy associated with type 2 diabetes mellitus ([place]) 11/16/2014  ? Hypertension 05/19/2013  ? Vertigo 05/11/2013  ? Dizziness 05/11/2013  ? Tachycardia 05/11/2013   ? Lower urinary tract infectious disease 05/11/2013  ? Sinusitis 05/11/2013  ? Hyperlipidemia 02/15/2013  ? Type II diabetes mellitus, uncontrolled 01/01/2013  ? Benign paroxysmal positional vertigo 08/09/2012  ? Type II or unspecified type diabetes mellitus with neurological manifestations, uncontrolled(250.62) 08/09/2012  ? GLAUCOMA 03/16/2010  ? HOARSENESS 03/16/2010  ? COUGH 12/23/2009  ? Chest wall pain 12/23/2009  ? MUSCULOSKELETAL PAIN 01/28/2009  ? COPD mixed type ([place]) 01/15/2009  ? Seasonal and perennial allergic rhinitis 11/23/2007  ? CHRONIC RHINITIS 07/16/2007  ? GERD (gastroesophageal reflux disease) 05/29/2007  ? I B S-DIARRHEAL PREDOMINATE 05/29/2007  ? FATTY LIVER DISEASE 05/29/2007  ? ? ?Orientation RESPIRATION BLADDER Height & Weight   ?  ?Self, Place ? O2 ([place] 3L) Incontinent Weight: 141 lb 15.6 oz (64.4 kg) ?Height:     ?BEHAVIORAL SYMPTOMS/MOOD NEUROLOGICAL BOWEL NUTRITION STATUS  ?    Continent Diet (cardiac/carb modified)  ?AMBULATORY STATUS COMMUNICATION OF NEEDS Skin   ?Limited Assist Verbally Normal ?  ?  ?  ?    ?     ?     ? ? ?Personal Care Assistance Level of Assistance  ?Bathing, Feeding, Dressing Bathing Assistance: Limited assistance ?Feeding assistance: Independent ?Dressing Assistance: Limited assistance ?   ? ?Functional Limitations Info  ?Sight Sight Info: Impaired ?  ?   ? ? ?SPECIAL CARE FACTORS FREQUENCY  ?PT (By licensed PT), OT (By licensed OT)   ?  ?PT Frequency: 5x/wk ?OT Frequency: 5x/wk ?  ?  ?  ?   ? ? ?Contractures Contractures Info: Not present  ? ? ?Additional Factors Info  ?  Code Status, Allergies, Psychotropic, Insulin Sliding Scale Code Status Info: Full ?Allergies Info: Chlordiazepoxide-clidinium, Biaxin (Clarithromycin), Tramadol, Codeine ?Psychotropic Info: Xanax 0.53m at bedtime PRN ?Insulin Sliding Scale Info: see DC summary ?  ?   ? ?Current Medications (08/10/2021):  This is the current hospital active medication list ?Current Facility-Administered  Medications  ?Medication Dose Route Frequency Provider Last Rate Last Admin  ?  stroke: mapping our early stages of recovery book   Does not apply Once YKarmen Bongo MD      ? 0.9 %  sodium chloride infusion   Intravenous Continuous YKarmen Bongo MD   Held at 08/09/21 10488 ? acetaminophen (TYLENOL) tablet 650 mg  650 mg Oral Q4H PRN YKarmen Bongo MD      ? Or  ? acetaminophen (TYLENOL) 160 MG/5ML solution 650 mg  650 mg Per Tube Q4H PRN YKarmen Bongo MD      ? Or  ? acetaminophen (TYLENOL) suppository 650 mg  650 mg Rectal Q4H PRN YKarmen Bongo MD      ? albuterol (PROVENTIL) (2.5 MG/3ML) 0.083% nebulizer solution 3 mL  3 mL Inhalation Q6H PRN YKarmen Bongo MD      ? ALPRAZolam (Duanne Moron tablet 0.25 mg  0.25 mg Oral QHS PRN YKarmen Bongo MD      ? aspirin EC tablet 81 mg  81 mg Oral Daily YKarmen Bongo MD   81 mg at 08/10/21 08916 ? enoxaparin (LOVENOX) injection 40 mg  40 mg Subcutaneous Q24H YKarmen Bongo MD   40 mg at 08/09/21 1531  ? feeding supplement (GLUCERNA SHAKE) (GLUCERNA SHAKE) liquid 237 mL  237 mL Oral BID BM Pokhrel, Laxman, MD   237 mL at 08/10/21 1210  ? insulin aspart (novoLOG) injection 0-15 Units  0-15 Units Subcutaneous TID WC YKarmen Bongo MD   2 Units at 08/10/21 1216  ? insulin glargine-yfgn (SEMGLEE) injection 26 Units  26 Units Subcutaneous QHS YKarmen Bongo MD   26 Units at 08/09/21 2327  ? ipratropium-albuterol (DUONEB) 0.5-2.5 (3) MG/3ML nebulizer solution 3 mL  3 mL Nebulization BID YKarmen Bongo MD      ? lactated ringers infusion   Intravenous Continuous YKarmen Bongo MD 75 mL/hr at 08/10/21 0931 New Bag at 08/10/21 0931  ? MEDLINE mouth rinse  15 mL Mouth Rinse BID YKarmen Bongo MD   15 mL at 08/10/21 09450 ? multivitamin with minerals tablet 1 tablet  1 tablet Oral Daily Pokhrel, Laxman, MD   1 tablet at 08/10/21 1210  ? pantoprazole (PROTONIX) EC tablet 40 mg  40 mg Oral Daily YKarmen Bongo MD   40 mg at 08/10/21 03888 ? pravastatin  (PRAVACHOL) tablet 20 mg  20 mg Oral Daily YKarmen Bongo MD   20 mg at 08/10/21 02800 ? protein supplement (ENSURE MAX) liquid  11 oz Oral QHS Pokhrel, Laxman, MD      ? senna-docusate (Senokot-S) tablet 1 tablet  1 tablet Oral QHS PRN YKarmen Bongo MD      ? thiamine 5063min normal saline (5055mIVPB  500 mg Intravenous TID YatKarmen BongoD 100 mL/hr at 08/10/21 0937 500 mg at 08/10/21 [id number] ? ? ?Discharge Medications: ?Please see discharge summary for a list of discharge medications. ? ?Relevant Imaging Results: ? ?Relevant Lab Results: ? ? ?Additional Information ?SS#: [id number]?EliGeralynn OchsCSW ? ? ? ? ?

## 2021-08-10 NOTE — Care Management Obs Status (Signed)
MEDICARE OBSERVATION STATUS NOTIFICATION ? ? ?Patient Details  ?Name: [name] ?MRN: [id number] ?Date of Birth: 1935-06-04 ? ? ?Medicare Observation Status Notification Given:  Yes ? ? ? ?[place], LCSW ?08/10/2021, 1:12 PM ?

## 2021-08-10 NOTE — Progress Notes (Signed)
Carotid duplex bilateral study completed.   Please see CV Proc for preliminary results.   Zekiah Coen, RDMS, RVT  

## 2021-08-10 NOTE — Hospital Course (Addendum)
[name] is a 86 y.o. female with past medical history significant for anxiety; COPD on home O2; diabetes mellitus type 2, hypertension, hyperlipidemia, status post pacemaker placement, dementia and NASH presented to the hospital with difficulty with speech with random and inappropriate words in the middle of conversation.  Patient's  aide went to check on her this and she was talking strange, wasn't putting words together well.  Patient also complained of chest pain and 911 was called in.  She has no family members but does have a back up contact, [name], [phone].  In the ED, CT scan was unremarkable.  Neurology recommended EEG MRI and mental status change work-up.  Patient was then admitted to the hospital for further evaluation and treatment. ?   ?Assessment and Plan: ? ?Principal Problem: ?  Ischemic stroke ([place]) ?Active Problems: ?  Acute metabolic encephalopathy ?  COPD mixed type ([place]) ?  Hyperlipidemia ?  Hypertension ?  Type 2 diabetes mellitus ([place]) ?  Pacemaker ?  Mild cognitive impairment with memory loss ?  Acute ischemic stroke ([place]) ?  ?Acute ischemic stroke ?Patient presented with aphasia.  Initially code stroke was called in.  Neurology was on board.  CT head without acute findings.  CTA neck showed no acute large vessel occlusion but 30% stenosis of the proximal ICA on the right and 60% stenosis on distal bulb on the left. MRI of the brain was then performed later due to pacemaker in place which showed a small restricted diffusion in the left temporoparietal area compatible with acute stroke.  EEG done showed cortical dysfunction without any seizures.  Communicated with neurology. Carotid duplex ultrasound showed some 50% stenosis of the external carotid artery and less than 39% stenosis of bilateral internal carotid artery. Recommendation is aspirin Plavix for 3 weeks then Plavix alone.  Pacemaker interrogation did not reveal  atrial fibrillation. ? ?Acute metabolic  encephalopathy ?Likely at baseline at this time.  Some initial concern for encephalopathy on background of dementia.  Patient does have short-term memory impairment.  EEG with cortical dysfunction  Urinalysis was abnormal and was empirically given 1 dose of IV Rocephin.    Urine culture was with mixed organisms.  But patient is complaining of dysuria and frequency.  We will keep Keflex on discharge for 5 days to complete the course.  Mildly elevated ammonia level with normal LFTs.  Blood cultures was negative.  Vitamin B-12 level at 456.  TSH of 1.6.  COVID and influenza was negative.  Urine drug screen was negative on presentation.  Psychiatry has also seen the patient.  At this time patient is able to make her own decisions. ? ?Possible cystitis.  We will continue Keflex on discharge to complete the course. ? ?Mild cognitive impairment with memory loss ?No family member to confirm that.  Admitting provider had spoken with home health nurse and a caseworker.  He is likely at baseline..  Patient does have short-term memory loss. ?  ?History of complete heart block status post pacemaker. ?Follows up with cardiology as outpatient ? ?Type 2 diabetes mellitus ([place]) ?Previous hemoglobin  A1c was 11.6, repeat A1c at this time at 15.0.  Poorly controlled diabetes.  Looks like patient has not been taking care of herself at home.  Continue long-acting and sliding scale insulin at this time.  I spoke with the patient regarding the need for compliance with medication and follow-up with Dr. Dwyane Dee for her endocrinologist as outpatient. ?  ?Hypertension ?  Toprol-XL  at home.  Will resume on discharge. ? ?Hyperlipidemia ?On Pravachol.  LDL cholesterol was 42. ?  ?COPD mixed type ([place]) ?Continue bronchodilators from home. ? ?Deconditioning, debility. ?Patient seen by physical therapy and occasional therapy recommended skilled nursing facility placement.  Patient is refusing placement.  Psychiatry has seen the patient at this time.   Patient is able to make her own decisions at this time.  She insist on getting discharged home.  No arrange for home health PT OT RN and social work on discharge.   ?

## 2021-08-10 NOTE — Evaluation (Signed)
Occupational Therapy Evaluation ?Patient Details ?Name: [name] ?MRN: [id number] ?DOB: 1935/10/11 ?Today's Date: 08/10/2021 ? ? ?History of Present Illness 86 y.o. female presenting with stroke-like symptoms/aphasia, encephalopathy. CT (-) for CVA; awaiting MRI due to pacemaker. Medical history significant of anxiety; COPD on home O2; DM; Glaucoma, spinal stenosis with neurogenic claudication, HTN; HLD; BPPV, Sjogren syndrome, pacemaker placement; dementia; and NASH  ? ?Clinical Impression ?  ?Pt lives alone with her cat and has a PCA 8 hrs/day 5 days/wk, who assists with IADL tasks. Per pt she normally uses a "small walker" to walk in the house, "sometimes uses her oxygen" and is able to complete her self care. Pt states she drove last week to get groceries, "but not far". States she had a fall last week and had to call the fire department to get her off the floor. "I fall a lot".  Pt unaware of being incontinent of BM and required Max A to clean up. In order to DC home, recommend increased level of supervision during evening hours. Unsure of baseline functional status,  however pt appears to demonstrate a functional decline. Currently recommend rehab at SNF however if pt improves cognitively may be able to DC home with [place]. Acute OT to follow.  ?   ? ?Recommendations for follow up therapy are one component of a multi-disciplinary discharge planning process, led by the attending physician.  Recommendations may be updated based on patient status, additional functional criteria and insurance authorization.  ? ?Follow Up Recommendations ? Skilled nursing-short term rehab (<3 hours/day)  ?  ?Assistance Recommended at Discharge Frequent or constant Supervision/Assistance  ?Patient can return home with the following A little help with walking and/or transfers;A little help with bathing/dressing/bathroom;Assistance with cooking/housework;Direct supervision/assist for medications management;Direct supervision/assist  for financial management;Assist for transportation ? ?  ?Functional Status Assessment ? Patient has had a recent decline in their functional status and demonstrates the ability to make significant improvements in function in a reasonable and predictable amount of time.  ?Equipment Recommendations ? None recommended by OT  ?  ?Recommendations for Other Services Speech consult ? ? ?  ?Precautions / Restrictions Precautions ?Precautions: Fall ?Restrictions ?Weight Bearing Restrictions: No  ? ?  ? ?Mobility Bed Mobility ?Overal bed mobility: Needs Assistance ?Bed Mobility: Supine to Sit ?  ?  ?Supine to sit: Mod assist ?  ?  ?General bed mobility comments: able to get to the EOB however required increased assistance to transition to sitting adn scoot forward ?  ? ?Transfers ?Overall transfer level: Needs assistance ?Equipment used: Rolling walker (2 wheels) ?Transfers: Sit to/from Stand ?Sit to Stand: Min assist ?  ?  ?  ?  ?  ?General transfer comment: from toilet height ?  ? ?  ?Balance Overall balance assessment: History of Falls, Needs assistance ?  ?Sitting balance-Leahy Scale: Fair ?  ?  ?  ?Standing balance-Leahy Scale: Poor ?  ? Required assistance to prevent fall into the wall when rising form the toilet ?  ?  ?  ?  ?  ?  ?  ?  ?  ?  ?   ? ?ADL either performed or assessed with clinical judgement  ? ?ADL Overall ADL's : Needs assistance/impaired ?  ?  ?Grooming: Set up;Supervision/safety;Standing ?  ?Upper Body Bathing: Supervision/ safety;Set up;Standing ?  ?Lower Body Bathing: Minimal assistance ?  ?Upper Body Dressing : Set up;Supervision/safety;Sitting ?  ?Lower Body Dressing: Sit to/from stand;Moderate assistance ?  ?Toilet Transfer: Minimal assistance;Rolling walker (  2 wheels) ?Toilet Transfer Details (indicate cue type and reason): assist to prevent fall into wall ?Toileting- Clothing Manipulation and Hygiene: Maximal assistance ?Toileting - Clothing Manipulation Details (indicate cue type and reason):  attempting to clean herself, however had stool all ove trher fingers ?  ?  ?Functional mobility during ADLs: Minimal assistance ?   ? ? ? ?Vision Baseline Vision/History: 1 Wears glasses ?   ?   ?Perception Perception ?Comments: poor use of space; will further assess ?  ?Praxis Praxis ?Praxis-Other Comments: appears intact ?  ? ?Pertinent Vitals/Pain Pain Assessment ?Pain Assessment: Faces ?Faces Pain Scale: Hurts a little bit ?Pain Location: "butt" ?Pain Descriptors / Indicators: Discomfort, Grimacing, Sore ?Pain Intervention(s): Limited activity within patient's tolerance  ? ? ? ?Hand Dominance Right ?  ?Extremity/Trunk Assessment Upper Extremity Assessment ?Upper Extremity Assessment: Generalized weakness ?  ?Lower Extremity Assessment ?Lower Extremity Assessment: Defer to PT evaluation ?  ?Cervical / Trunk Assessment ?Cervical / Trunk Assessment: Kyphotic ?  ?Communication Communication ?Communication: Expressive difficulties ?  ?Cognition Arousal/Alertness: Awake/alert ?Behavior During Therapy: Agitated (at times) ?Overall Cognitive Status: Impaired/Different from baseline ?Area of Impairment: Orientation, Attention, Memory, Safety/judgement, Awareness, Problem solving ?  ?  ?  ?  ?  ?  ?  ?  ?Orientation Level: Disoriented to, Time ?Current Attention Level: Selective ?Memory: Decreased recall of precautions, Decreased short-term memory ?  ?Safety/Judgement: Decreased awareness of safety, Decreased awareness of deficits ?Awareness: Emergent ?Problem Solving: Slow processing ?General Comments: Unaware of being incontinent of BM; tangential at times; Difficulty with word finding at times ?  ?  ?General Comments  scrapes on legs form fall ? ?  ?Exercises   ?  ?Shoulder Instructions    ? ? ?Home Living Family/patient expects to be discharged to:: Private residence ?Living Arrangements: Alone ?Available Help at Discharge: Available PRN/intermittently;Personal care attendant ?Type of Home: Apartment ?Home Access:  Stairs to enter ?Entrance Stairs-Number of Steps: 2 ?Entrance Stairs-Rails: Can reach both ?Home Layout: One level ?  ?  ?Bathroom Shower/Tub: Tub/shower unit ?  ?Bathroom Toilet: Standard ?Bathroom Accessibility: No ?  ?Home Equipment: Conservation officer, nature (2 wheels);Shower seat ("little walker") ?  ?  ?  ? ?  ?Prior Functioning/Environment Prior Level of Function : Needs assist ? Cognitive Assist : ADLs (cognitive) ?  ?  ?  ?  ?  ?  ?ADLs Comments: PCA assists wtih cleaning pt's house adn cooking as needed; pt statse she manages her own medication adn drives occasionally ?  ? ?  ?  ?OT Problem List: Decreased strength;Decreased activity tolerance;Impaired balance (sitting and/or standing);Decreased cognition;Decreased safety awareness;Decreased knowledge of use of DME or AE;Decreased knowledge of precautions;Obesity;Impaired UE functional use;Pain ?  ?   ?OT Treatment/Interventions: Self-care/ADL training;Therapeutic exercise;DME and/or AE instruction;Therapeutic activities;Cognitive remediation/compensation;Patient/family education;Balance training  ?  ?OT Goals(Current goals can be found in the care plan section) Acute Rehab OT Goals ?Patient Stated Goal: to go home ?OT Goal Formulation: With patient ?Time For Goal Achievement: 08/24/21 ?Potential to Achieve Goals: Good  ?OT Frequency: Min 2X/week ?  ? ?Co-evaluation   ?  ?  ?  ?  ? ?  ?AM-PAC OT "6 Clicks" Daily Activity     ?Outcome Measure Help from another person eating meals?: A Little ?Help from another person taking care of personal grooming?: A Little ?Help from another person toileting, which includes using toliet, bedpan, or urinal?: A Lot ?Help from another person bathing (including washing, rinsing, drying)?: A Lot ?Help from another person to put on  and taking off regular upper body clothing?: A Little ?Help from another person to put on and taking off regular lower body clothing?: A Lot ?6 Click Score: 15 ?  ?End of Session Equipment Utilized During  Treatment: Gait belt;Rolling walker (2 wheels) ?Nurse Communication: Mobility status ? ?Activity Tolerance: Patient tolerated treatment well ?Patient left: in chair;with call bell/phone within reach;with chai

## 2021-08-10 NOTE — Progress Notes (Signed)
Initial Nutrition Assessment ? ?DOCUMENTATION CODES:  ? ?Not applicable ? ?INTERVENTION:  ? ?-Liberalize diet to carb modified for wider variety of food selections ?-MVI with minerals daily ?-Glucerna Shake po BID, each supplement provides 220 kcal and 10 grams of protein  ?-Ensure Max po daily, each supplement provides 150 kcal and 30 grams of protein   ? ?NUTRITION DIAGNOSIS:  ? ?Predicted suboptimal nutrient intake related to chronic illness (dementia) as evidenced by estimated needs. ? ?GOAL:  ? ?Patient will meet greater than or equal to 90% of their needs ? ?MONITOR:  ? ?PO intake, Supplement acceptance, Diet advancement, Labs, Weight trends, Skin, I & O's ? ?REASON FOR ASSESSMENT:  ? ?Consult ?Assessment of nutrition requirement/status ? ?ASSESSMENT:  ? ?[name] is a 86 y.o. female with medical history significant of anxiety; COPD on home O2; DM; HTN; HLD; pacemaker placement; dementia; and NASH presenting with stroke-like symptoms.  She has clear aphasia and speaks random and inappropriate words in the midst of conversation and so is really unable to provide history.  I called and spoke with her home health agency.  She is part of [facility] and would have a case Freight forwarder, Ms. Delma Freeze, [phone].  She is usually verbal and it makes sense.  Her aide went to check on her this am and she was talking strange, wasn't putting words together well.  She then complained of chest pain and so the aide called 911.  EMS thought she was having a stroke.   The patient says things like "I feel tall" and "My cat has places places, we go the places places" that are nonsensical.  She has no advanced directives.  She has no family members but does have a back up contact, [name], [phone]. ? ?Pt admitted with stroke-like symptoms (aphasia) and acute metabolic encephalopathy.  ? ?Reviewed I/O's: + 50 ml x 24 hours ?  ?Pt unavailable at time of visit. Attempted to speak with pt via call to hospital room phone,  however, unable to reach. RD unable to obtain further nutrition-related history or complete nutrition-focused physical exam at this time.   ? ?Per MD noted, CT and CTA unremarkable; awaiting MRI.  ? ?Pt currently on a heart healthy/ carb modified diet. No meal completion data available at this time to assess.  ? ?Reviewed wt hx; pt has experienced a 9% wt loss over the past 6 months. While this is not significant for time frame, it is concerning given, dementia, advanced age, and uncontrolled DM.  ? ?Medications reviewed.  ? ?Lab Results  ?Component Value Date  ? HGBA1C 11.6 (H) 05/31/2021  ? PTA DM medications are 12-16 units insulin aspart TID and 36 units insulin degludec daily.  ? ?Labs reviewed: CBGS: 169-205 (inpatient orders for glycemic control are 0-15 units insulin aspart TID with meals and 26 units insulin glargine-yfgn daily).   ? ?Diet Order:   ?Diet Order   ? ?       ?  Diet heart healthy/carb modified Room service appropriate? Yes; Fluid consistency: Thin  Diet effective ____       ?  ? ?  ?  ? ?  ? ? ?EDUCATION NEEDS:  ? ?No education needs have been identified at this time ? ?Skin:  Skin Assessment: Reviewed RN Assessment ? ?Last BM:  08/08/21 ? ?Height:  ? ?Ht Readings from Last 1 Encounters:  ?06/16/21 5' 3"  (1.6 m)  ? ? ?Weight:  ? ?Wt Readings from Last 1 Encounters:  ?  08/09/21 64.4 kg  ? ? ?Ideal Body Weight:  52.3 kg ? ?BMI:  Body mass index is 25.15 kg/m?. ? ?Estimated Nutritional Needs:  ? ?Kcal:  1600-1800 ? ?Protein:  80-95 grams ? ?Fluid:  > 1.6 L ? ? ? ?Loistine Chance, RD, LDN, CDCES ?Registered Dietitian II ?Certified Diabetes Care and Education Specialist ?Please refer to [facility] for RD and/or RD on-call/weekend/after hours pager  ?

## 2021-08-10 NOTE — Progress Notes (Signed)
STROKE TEAM PROGRESS NOTE  ? ?HISTORY OF PRESENT ILLNESS (per record) ?[name] is a 86 y.o. female with PMH HTN, DM2, HLD, COPD, Complete AV block s/p pacemaker. Patient presented to [facility] (08/09/2021) as a code stroke for left facial droop and nonsensical speech noted by caretaker. ?CBG 364, BP 188/96 on arrival. ?LKN 08/08/2021, unclear.  ?NIHSS 6. ?tNK: No, outside of window.  ?Pacemaker placed 01/14/2020 for complete AV block. [employer] MRI 2272 Pacemaker [id number]. ?On arrival patient was awake, alert. Unable to follow commands. Speech was fluent and aphasic with paraphasias. Unable to repeat. Can name common used items. She's unable to cooperate for visual field testing, but does not blink to threat. Concerned for possible R VF cut. 5/5 strength intact in all 4 extremities. She responds to touch in all 4 limbs, but unreliably comments. Coordination grossly intact in all 4 limbs.  ?CT head, CT perfusion, CTA head and neck completed. ?Ordered rEEG and MRI brain. ? ? ?INTERVAL HISTORY ?No one is at the bedside.   Patient has poor recollection of events and cannot reliably tell me why she is here.  She lives alone but appears to have mild cognitive impairment at baseline.  She has no family.  CT head shows no acute abnormalities CT perfusion negative for ischemia and CT angiogram of the head and neck shows less than 30% bilateral carotid stenosis in the neck. ?MRI is pending as patient has a pacemaker and will need pacemaker representative to adjust it ?EEG shows focal slowing on the left hemisphere but no definite epileptiform activity. ?UA also suggest urinary tract infection.  Urine culture is pending.  She does have a history of recurrent urinary tract infections. ?OBJECTIVE ?Vitals:  ? 08/09/21 2321 08/10/21 0320 08/10/21 0346 08/10/21 0725  ?BP: (!) 110/49 136/78  131/69  ?Pulse: 83 87  94  ?Resp: 14 17  20   ?Temp: 98.3 ?F (36.8 ?C) 98 ?F (36.7 ?C)  97.9 ?F (36.6 ?C)  ?TempSrc: Oral   Oral  ?SpO2: 97% 100% 95%  97%  ?Weight:      ? ? ?CBC:  ?Recent Labs  ?Lab 08/09/21 ?1044 08/09/21 ?1050 08/09/21 ?1156  ?WBC 7.7  --   --   ?NEUTROABS 5.5  --   --   ?HGB 15.6* 17.3* 21.4*  ?HCT 48.2* 51.0* 63.0*  ?MCV 88.9  --   --   ?PLT 150  --   --   ? ? ?Basic Metabolic Panel:  ?Recent Labs  ?Lab 08/09/21 ?1044 08/09/21 ?1050 08/09/21 ?1156  ?NA 135 135 135  ?K 4.5 4.3 4.3  ?CL 96* 94*  --   ?CO2 29  --   --   ?GLUCOSE 311* 317*  --   ?BUN 9 10  --   ?CREATININE 0.63 0.50  --   ?CALCIUM 9.4  --   --   ? ? ?Lipid Panel:  ?   ?Component Value Date/Time  ? CHOL 90 08/09/2021 1315  ? TRIG 50 08/09/2021 1315  ? HDL 38 (L) 08/09/2021 1315  ? CHOLHDL 2.4 08/09/2021 1315  ? VLDL 10 08/09/2021 1315  ? LDLCALC 42 08/09/2021 1315  ? [place] 48 05/31/2021 1119  ? ?HgbA1c:  ?Lab Results  ?Component Value Date  ? HGBA1C 11.6 (H) 05/31/2021  ? ?Urine Drug Screen:  ?   ?Component Value Date/Time  ? LABOPIA NONE DETECTED 08/09/2021 1421  ? COCAINSCRNUR NONE DETECTED 08/09/2021 1421  ? LABBENZ NONE DETECTED 08/09/2021 1421  ? AMPHETMU NONE DETECTED 08/09/2021  [place] DETECTED 08/09/2021 1421  ? LABBARB NONE DETECTED 08/09/2021 1421  ?  ?Alcohol Level No results found for: ETH ? ?IMAGING ? ? ?EEG adult ? ?Result Date: 08/09/2021 ?Lora Havens, MD     08/09/2021  1:48 PM Patient Name: [name] MRN: [id number] Epilepsy Attending: Lora Havens Referring Physician/Provider: Merrily Brittle, DO Date: 08/09/2021 Duration: 22.37 mins Patient history: 72 old female with new onset aphasia.  EEG to evaluate for seizure. Level of alertness: Awake AEDs during EEG study: None Technical aspects: This EEG study was done with scalp electrodes positioned according to the 10-20 International system of electrode placement. Electrical activity was acquired at a sampling rate of 500Hz  and reviewed with a high frequency filter of 70Hz  and a low frequency filter of 1Hz . EEG data were recorded continuously and digitally stored. Description: No clear posterior  dominant rhythm was seen.  EEG showed continuous generalized and lateralized left hemisphere 3 to 7 Hz theta-delta slowing.  Hyperventilation and photic stimulation were not performed.   ABNORMALITY - Continuous slow, generalized and lateralized left hemisphere IMPRESSION: This study is suggestive of cortical dysfunction in left hemisphere which could be secondary to underlying structural abnormality, postictal state.  Additionally there is moderate diffuse encephalopathy, nonspecific etiology.  No seizures or epileptiform discharges were seen throughout the recording. Priyanka Barbra Sarks  ? ?ECHOCARDIOGRAM COMPLETE ? ?Result Date: 08/09/2021 ?   ECHOCARDIOGRAM REPORT   Patient Name:   [name] Date of Exam: 08/09/2021 Medical Rec #:  [id number]        Height:       63.0 in Accession #:    [id number]       Weight:       142.0 lb Date of Birth:  [birth year]        BSA:          1.672 m? Patient Age:    43 years         BP:           136/63 mmHg Patient Gender: F                HR:           95 bpm. Exam Location:  Inpatient Procedure: 2D Echo, Cardiac Doppler and Color Doppler Indications:    TIA  History:        Patient has prior history of Echocardiogram examinations. COPD;                 Risk Factors:Hypertension and Diabetes.  Sonographer:    Jyl Heinz Referring Phys: [place]  1. Left ventricular ejection fraction, by estimation, is 55 to 60%. The left ventricle has normal function. The left ventricle has no regional wall motion abnormalities. Indeterminate diastolic filling due to E-A fusion.  2. Right ventricular systolic function is normal. The right ventricular size is normal. There is normal pulmonary artery systolic pressure. The estimated right ventricular systolic pressure is 97.6 mmHg.  3. The pericardial effusion is circumferential.  4. The mitral valve is normal in structure. No evidence of mitral valve regurgitation. No evidence of mitral stenosis.  5. The aortic valve is  tricuspid. Aortic valve regurgitation is not visualized. Aortic valve sclerosis is present, with no evidence of aortic valve stenosis.  6. The inferior vena cava is normal in size with greater than 50% respiratory variability, suggesting right atrial pressure of 3 mmHg. FINDINGS  Left Ventricle: Left ventricular ejection fraction,  by estimation, is 55 to 60%. The left ventricle has normal function. The left ventricle has no regional wall motion abnormalities. The left ventricular internal cavity size was normal in size. There is  no left ventricular hypertrophy. Abnormal (paradoxical) septal motion, consistent with RV pacemaker. Indeterminate diastolic filling due to E-A fusion. Right Ventricle: The right ventricular size is normal. No increase in right ventricular wall thickness. Right ventricular systolic function is normal. There is normal pulmonary artery systolic pressure. The tricuspid regurgitant velocity is 2.61 m/s, and  with an assumed right atrial pressure of 3 mmHg, the estimated right ventricular systolic pressure is 09.6 mmHg. Left Atrium: Left atrial size was normal in size. Right Atrium: Right atrial size was normal in size. Pericardium: Trivial pericardial effusion is present. The pericardial effusion is circumferential. Mitral Valve: The mitral valve is normal in structure. No evidence of mitral valve regurgitation. No evidence of mitral valve stenosis. Tricuspid Valve: The tricuspid valve is normal in structure. Tricuspid valve regurgitation is mild . No evidence of tricuspid stenosis. Aortic Valve: The aortic valve is tricuspid. Aortic valve regurgitation is not visualized. Aortic valve sclerosis is present, with no evidence of aortic valve stenosis. Aortic valve peak gradient measures 4.2 mmHg. Pulmonic Valve: The pulmonic valve was normal in structure. Pulmonic valve regurgitation is trivial. No evidence of pulmonic stenosis. Aorta: The aortic root is normal in size and structure. Venous: The  inferior vena cava is normal in size with greater than 50% respiratory variability, suggesting right atrial pressure of 3 mmHg. IAS/Shunts: No atrial level shunt detected by color flow Doppler. Additiona

## 2021-08-11 ENCOUNTER — Inpatient Hospital Stay (HOSPITAL_COMMUNITY): Payer: Medicare Other

## 2021-08-11 DIAGNOSIS — J449 Chronic obstructive pulmonary disease, unspecified: Secondary | ICD-10-CM | POA: Diagnosis not present

## 2021-08-11 DIAGNOSIS — R2990 Unspecified symptoms and signs involving the nervous system: Secondary | ICD-10-CM | POA: Diagnosis not present

## 2021-08-11 DIAGNOSIS — E782 Mixed hyperlipidemia: Secondary | ICD-10-CM | POA: Diagnosis not present

## 2021-08-11 DIAGNOSIS — G9341 Metabolic encephalopathy: Secondary | ICD-10-CM | POA: Diagnosis not present

## 2021-08-11 LAB — GLUCOSE, CAPILLARY
Glucose-Capillary: 241 mg/dL — ABNORMAL HIGH (ref 70–99)
Glucose-Capillary: 256 mg/dL — ABNORMAL HIGH (ref 70–99)
Glucose-Capillary: 155 mg/dL — ABNORMAL HIGH (ref 70–99)
Glucose-Capillary: 258 mg/dL — ABNORMAL HIGH (ref 70–99)

## 2021-08-11 LAB — URINE CULTURE

## 2021-08-11 LAB — RPR: RPR Ser Ql: NONREACTIVE

## 2021-08-11 MED ORDER — THIAMINE HCL 100 MG PO TABS
100.0000 mg | ORAL_TABLET | Freq: Every day | ORAL | Status: DC
  Administered 2021-08-11 – 2021-08-12 (×2): 100 mg via ORAL
  Filled 2021-08-11 (×3): qty 1

## 2021-08-11 NOTE — TOC Progression Note (Addendum)
Transition of Care (TOC) - Progression Note  ? ? ?Patient Details  ?Name: [name] ?MRN: [id number] ?Date of Birth: 08/04/1935 ? ?Transition of Care (TOC) CM/SW Contact  ?Geralynn Ochs, LCSW ?Phone Number: ?08/11/2021, 12:25 PM ? ?Clinical Narrative:   CSW noting per chart review that patient still does not appear fully oriented today, unsure of decision making capacity. Per speech eval, patient scored 10/30 on SLUMS and exhibited deficits in problem solving. CSW noted contacts for patient's CAP caseworker and another emergency contact in H&P, CSW attempted to contact both and left voicemails requesting a call back. CSW to follow, attempting to locate a decision maker for patient and hopeful that cognition will improve. ? ? ?UPDATE 3:14 PM: CSW received call back from patient's CAP caseworker, Ms. Warnell Forester. Per Ms. Raja, patient has no family and no POA was ever provided. Patient did provide one emergency contact on their paperwork, [name], that CSW has already attempted to contact, but they had never engaged with her. Patient had indicated that she was a "good friend". Per Ms. Raja, they did not have any serious concerns about patient being home alone when they were not there, as she was able to manage on her own, but they are not able to provide 24/7 aide support for the patient at this time. CSW continuing to await call back from [place]. ? ? ? ?Expected Discharge Plan: [place] ?Barriers to Discharge: Continued Medical Work up, Family Issues ? ?Expected Discharge Plan and Services ?Expected Discharge Plan: [place] ?  ?  ?Post Acute Care Choice: NA ?Living arrangements for the past 2 months: [place] ?                ?  ?  ?  ?  ?  ?  ?  ?  ?  ?  ? ? ?Social Determinants of Health (SDOH) Interventions ?  ? ?Readmission Risk Interventions ?   ? View : No data to display.  ?  ?  ?  ? ? ?

## 2021-08-11 NOTE — Progress Notes (Signed)
PT Cancellation Note ? ?Patient Details ?Name: [name] ?MRN: [id number] ?DOB: 1935-11-14 ? ? ?Cancelled Treatment:    Reason Eval/Treat Not Completed: Other (comment).  Pt is initially going to get MRI and later just declined therapy, stating she just doesn't feel like it. ? ? ?Ramond Dial ?08/11/2021, 4:48 PM ? ?Mee Hives, PT PhD ?Acute Rehab Dept. Number: [facility] [phone] and [place] [phone] ? ?

## 2021-08-11 NOTE — Progress Notes (Signed)
Pt transferred to MRI after encouragement from RN.  Pt transported to MRI by bed.  Pt to be monitored by SWOT RN during MRI.   ?

## 2021-08-11 NOTE — Plan of Care (Signed)

## 2021-08-11 NOTE — Progress Notes (Signed)
STROKE TEAM PROGRESS NOTE  ? ? ? ?INTERVAL HISTORY ?Patient is sitting in a bedside chair.  Her caregiver is at the bedside.  Patient is refusing to get MRI because she states she had one a few weeks ago and there is no need for this month.  I explained to her symptoms are more recent and CT scan is not sensitive to pick up small strokes and hence MRI may be better.  Urine cultures are contaminated.  Blood cultures no growth at 24 hours ?EEG shows 3 to 7 Hz theta delta slowing in the left hemisphere with no definite epileptiform activity.  Carotid ultrasound shows bilateral 50% ECA stenosis but no significant ICA stenosis bilaterally.  Echocardiogram showed ejection fraction 55 to 60%. ?Vitamin B12 and TSH are normal ?OBJECTIVE ?Vitals:  ? 08/10/21 2309 08/11/21 0341 08/11/21 0809 08/11/21 1136  ?BP: (!) 126/53 (!) 148/59 132/60 138/62  ?Pulse: 100 93 98 94  ?Resp: 17 20 17 20   ?Temp: 98.4 ?F (36.9 ?C)  98.3 ?F (36.8 ?C) 98 ?F (36.7 ?C)  ?TempSrc: Oral  Oral Oral  ?SpO2: 99% 98% 98% 95%  ?Weight:      ? ? ?CBC:  ?Recent Labs  ?Lab 08/09/21 ?1044 08/09/21 ?1050 08/09/21 ?1156 08/10/21 ?1419  ?WBC 7.7  --   --  7.1  ?NEUTROABS 5.5  --   --   --   ?HGB 15.6*   < > 21.4* 14.6  ?HCT 48.2*   < > 63.0* 45.4  ?MCV 88.9  --   --  88.7  ?PLT 150  --   --  143*  ? < > = values in this interval not displayed.  ? ? ?Basic Metabolic Panel:  ?Recent Labs  ?Lab 08/09/21 ?1044 08/09/21 ?1050 08/09/21 ?1156  ?NA 135 135 135  ?K 4.5 4.3 4.3  ?CL 96* 94*  --   ?CO2 29  --   --   ?GLUCOSE 311* 317*  --   ?BUN 9 10  --   ?CREATININE 0.63 0.50  --   ?CALCIUM 9.4  --   --   ? ? ?Lipid Panel:  ?   ?Component Value Date/Time  ? CHOL 90 08/09/2021 1315  ? TRIG 50 08/09/2021 1315  ? HDL 38 (L) 08/09/2021 1315  ? CHOLHDL 2.4 08/09/2021 1315  ? VLDL 10 08/09/2021 1315  ? LDLCALC 42 08/09/2021 1315  ? [place] 48 05/31/2021 1119  ? ?HgbA1c:  ?Lab Results  ?Component Value Date  ? HGBA1C 15.0 (H) 08/09/2021  ? ?Urine Drug Screen:  ?   ?Component  Value Date/Time  ? LABOPIA NONE DETECTED 08/09/2021 1421  ? COCAINSCRNUR NONE DETECTED 08/09/2021 1421  ? LABBENZ NONE DETECTED 08/09/2021 1421  ? AMPHETMU NONE DETECTED 08/09/2021 1421  ? THCU NONE DETECTED 08/09/2021 1421  ? LABBARB NONE DETECTED 08/09/2021 1421  ?  ?Alcohol Level No results found for: ETH ? ?IMAGING ? ? ?ECHOCARDIOGRAM COMPLETE ? ?Result Date: 08/09/2021 ?   ECHOCARDIOGRAM REPORT   Patient Name:   [name] Date of Exam: 08/09/2021 Medical Rec #:  [id number]        Height:       63.0 in Accession #:    [id number]       Weight:       142.0 lb Date of Birth:  23-May-1935        BSA:          1.672 m? Patient Age:    82 years  BP:           136/63 mmHg Patient Gender: F                HR:           95 bpm. Exam Location:  Inpatient Procedure: 2D Echo, Cardiac Doppler and Color Doppler Indications:    TIA  History:        Patient has prior history of Echocardiogram examinations. COPD;                 Risk Factors:Hypertension and Diabetes.  Sonographer:    Jyl Heinz Referring Phys: [place]  1. Left ventricular ejection fraction, by estimation, is 55 to 60%. The left ventricle has normal function. The left ventricle has no regional wall motion abnormalities. Indeterminate diastolic filling due to E-A fusion.  2. Right ventricular systolic function is normal. The right ventricular size is normal. There is normal pulmonary artery systolic pressure. The estimated right ventricular systolic pressure is 54.9 mmHg.  3. The pericardial effusion is circumferential.  4. The mitral valve is normal in structure. No evidence of mitral valve regurgitation. No evidence of mitral stenosis.  5. The aortic valve is tricuspid. Aortic valve regurgitation is not visualized. Aortic valve sclerosis is present, with no evidence of aortic valve stenosis.  6. The inferior vena cava is normal in size with greater than 50% respiratory variability, suggesting right atrial pressure of 3 mmHg.  FINDINGS  Left Ventricle: Left ventricular ejection fraction, by estimation, is 55 to 60%. The left ventricle has normal function. The left ventricle has no regional wall motion abnormalities. The left ventricular internal cavity size was normal in size. There is  no left ventricular hypertrophy. Abnormal (paradoxical) septal motion, consistent with RV pacemaker. Indeterminate diastolic filling due to E-A fusion. Right Ventricle: The right ventricular size is normal. No increase in right ventricular wall thickness. Right ventricular systolic function is normal. There is normal pulmonary artery systolic pressure. The tricuspid regurgitant velocity is 2.61 m/s, and  with an assumed right atrial pressure of 3 mmHg, the estimated right ventricular systolic pressure is 82.6 mmHg. Left Atrium: Left atrial size was normal in size. Right Atrium: Right atrial size was normal in size. Pericardium: Trivial pericardial effusion is present. The pericardial effusion is circumferential. Mitral Valve: The mitral valve is normal in structure. No evidence of mitral valve regurgitation. No evidence of mitral valve stenosis. Tricuspid Valve: The tricuspid valve is normal in structure. Tricuspid valve regurgitation is mild . No evidence of tricuspid stenosis. Aortic Valve: The aortic valve is tricuspid. Aortic valve regurgitation is not visualized. Aortic valve sclerosis is present, with no evidence of aortic valve stenosis. Aortic valve peak gradient measures 4.2 mmHg. Pulmonic Valve: The pulmonic valve was normal in structure. Pulmonic valve regurgitation is trivial. No evidence of pulmonic stenosis. Aorta: The aortic root is normal in size and structure. Venous: The inferior vena cava is normal in size with greater than 50% respiratory variability, suggesting right atrial pressure of 3 mmHg. IAS/Shunts: No atrial level shunt detected by color flow Doppler. Additional Comments: A device lead is visualized in the right ventricle.  LEFT  VENTRICLE PLAX 2D LVIDd:         3.80 cm     Diastology LVIDs:         2.70 cm     LV e' medial:    3.92 cm/s LV PW:         1.10 cm  LV E/e' medial:  26.3 LV IVS:        1.10 cm     LV e' lateral:   4.13 cm/s LVOT diam:     2.00 cm     LV E/e' lateral: 24.9 LV SV:         41 LV SV Index:   25 LVOT Area:     3.14 cm?  LV Volumes (MOD) LV vol d, MOD A2C: 78.8 ml LV vol d, MOD A4C: 64.8 ml LV vol s, MOD A2C: 40.5 ml LV vol s, MOD A4C: 34.2 ml LV SV MOD A2C:     38.3 ml LV SV MOD A4C:     64.8 ml LV SV MOD BP:      34.2 ml RIGHT VENTRICLE            IVC RV Basal diam:  3.70 cm    IVC diam: 1.50 cm RV Mid diam:    2.90 cm RV S prime:     6.34 cm/s TAPSE (M-mode): 1.9 cm LEFT ATRIUM             Index        RIGHT ATRIUM           Index LA diam:        2.90 cm 1.73 cm/m?   RA Area:     11.80 cm? LA Vol (A2C):   23.3 ml 13.94 ml/m?  RA Volume:   26.90 ml  16.09 ml/m? LA Vol (A4C):   23.9 ml 14.30 ml/m? LA Biplane Vol: 24.6 ml 14.72 ml/m?  AORTIC VALVE AV Area (Vmax): 2.17 cm? AV Vmax:        102.00 cm/s AV Peak Grad:   4.2 mmHg LVOT Vmax:      70.50 cm/s LVOT Vmean:     50.800 cm/s LVOT VTI:       0.132 m  AORTA Ao Root diam: 3.10 cm Ao Asc diam:  2.70 cm MITRAL VALVE                TRICUSPID VALVE MV Area (PHT): 4.74 cm?     TR Peak grad:   27.2 mmHg MV Decel Time: 160 msec     TR Vmax:        261.00 cm/s MV E velocity: 103.00 cm/s                             SHUNTS                             Systemic VTI:  0.13 m                             Systemic Diam: 2.00 cm Dani Gobble Croitoru MD Electronically signed by Sanda Klein MD Signature Date/Time: 08/09/2021/5:20:52 PM    Final   ? ?VAS US CAROTID ? ?Result Date: 08/10/2021 ?Carotid Arterial Duplex Study Patient Name:  [name]  Date of Exam:   08/10/2021 Medical Rec #: [id number]         Accession #:    [id number] Date of Birth: 05/29/1935         Patient Gender: F Patient Age:   6 years Exam Location:  [facility] Procedure:      VAS US CAROTID Referring Phys:  ERIC LINDZEN --------------------------------------------------------------------------------  Indications:  CVA. Risk Factors:     Hypertension, hyperlipidemia, Diabetes, past history of

## 2021-08-11 NOTE — Progress Notes (Signed)
Called by SWOT RN - pt unable to complete MRI.  Pt stated she was too claustrophobic.  Pt unwilling to complete MRI with additional medication for anxiety.  SWOT RN attempted multiple times to get pt to complete MRI.  Pt unwilling to have MRI.  Education provided.  Will continue to monitor.   ?

## 2021-08-11 NOTE — Progress Notes (Signed)
?PROGRESS NOTE ? ? ? ?RYANNA TESCHNER  XHB:[id number] DOB: 1935/11/16 DOA: 08/09/2021 ?PCP: Wardell Honour, MD  ? ? ?Brief Narrative:  ?SAIGE CANTON is a 86 y.o. female with past medical history significant for anxiety; COPD on home O2; diabetes mellitus type 2, hypertension, hyperlipidemia, status post pacemaker placement, dementia and NASH presented to the hospital with difficulty with speech with random and inappropriate words in the middle of conversation.  Patient's  aide went to check on her this and she was talking strange, wasn't putting words together well.  Patient also complained of chest pain and 911 was called in.  She has no family members but does have a back up contact, [name], [phone].  In the ED, CT scan was unremarkable.  Neurology recommended EEG MRI and mental status change work-up.  Patient was then admitted hospital for further evaluation and treatment. ?   ?Assessment and Plan: ? ?Stroke-like symptoms ?Patient presented with aphasia.  Initially code stroke was called in.  Neurology on board.  CT head without acute findings.  CTA neck showed no acute large vessel occlusion but 30% stenosis of the proximal ICA on the right and 60% stenosis on distal bulb on the left. MRI pending due to pacemaker in place but patient is refusing at this time..  EEG done showed cortical dysfunction without any seizures.  Neurology has seen the patient today and recommended aspirin Plavix for 3 weeks then Plavix alone.  carotid duplex ultrasound showed some 50% stenosis of the external carotid artery and less than 39% stenosis of bilateral internal carotid artery.  ? ?Acute metabolic encephalopathy ?Some concern for encephalopathy on background of dementia.  Patient does have short-term memory impairment.  EEG with cortical dysfunction Improved today.  Urinalysis was abnormal.  Has been empirically treated for possible cystitis with IV Rocephin.  Mildly elevated ammonia level with normal LFTs.   Blood cultures been negative in 24 hours.  Vitamin B-12 level at 456.  TSH of 1.6.  COVID and influenza was negative.  Urine drug screen was negative on presentation. ? ?Mild cognitive impairment with memory loss ?No family member to confirm that.  Admitting provider had spoken with home health nurse and a caseworker.  Likely baseline. ?  ?History of complete heart block status post pacemaker ?-It is MRI compatible but needs special timeslot -pending at this time.  Patient does not wish to go to MRI ?  ?Type 2 diabetes mellitus ([place]) ?Previous hemoglobin  A1c was 11.6, repeat A1c at this time at 15.0.  Poorly controlled diabetes.  Continue long-acting and sliding scale insulin at this time. ?  ?Hypertension ?Allowing permissive hypertension.  Toprol-XL at home.  Currently on hold ? ?Hyperlipidemia ?On Pravachol.  LDL cholesterol was 42. ?  ?COPD mixed type ([place]) ?-On home O2, -Continue Duoneb and prn Albuterol.  ?  ? DVT prophylaxis: enoxaparin (LOVENOX) injection 40 mg Start: 08/09/21 1430 ? ? ?Code Status:   ?  Code Status: Full Code ? ?Disposition:  ?Skilled nursing facility as per OT recommendation.  Patient however refuses.  We will get cognitive evaluation today.  Patient lives by herself at home and her diabetes is uncontrolled. ? ?Status is: Inpatient ? ?The patient is inpatient because: Stroke work-up, MRI pending, need for rehabilitation. ? ? Family Communication:  ?None available ? ?Consultants:  ?Neurology ? ?Procedures:  ?None ? ?Antimicrobials:  ?Rocephin IV 4/4> ? ?Anti-infectives (From admission, onward)  ? ? Start     Dose/Rate Route Frequency Ordered  Stop  ? 08/09/21 2100  cefTRIAXone (ROCEPHIN) 1 g in sodium chloride 0.9 % 100 mL IVPB       ? 1 g ?200 mL/hr over 30 Minutes Intravenous  Once 08/09/21 2049 08/10/21 0801  ? ?  ? ? ?Subjective: ?Today, patient was seen and examined at bedside.  Patient states that she has some burning sensation while urinating.  Does not want to go for MRI.  She  states she feels fine.   ? ?Objective: ?Vitals:  ? 08/10/21 2309 08/11/21 0341 08/11/21 0809 08/11/21 1136  ?BP: (!) 126/53 (!) 148/59 132/60 138/62  ?Pulse: 100 93 98 94  ?Resp: 17 20 17 20   ?Temp: 98.4 ?F (36.9 ?C)  98.3 ?F (36.8 ?C) 98 ?F (36.7 ?C)  ?TempSrc: Oral  Oral Oral  ?SpO2: 99% 98% 98% 95%  ?Weight:      ? ? ?Intake/Output Summary (Last 24 hours) at 08/11/2021 1157 ?Last data filed at 08/11/2021 1001 ?Gross per 24 hour  ?Intake 2025.72 ml  ?Output --  ?Net 2025.72 ml  ? ?Filed Weights  ? 08/09/21 1124  ?Weight: 64.4 kg  ? ? ?Physical Examination: ? ?General:  Average built, not in obvious distress ?HENT:   No scleral pallor or icterus noted. Oral mucosa is moist.  ?Chest:  Clear breath sounds.  Diminished breath sounds bilaterally. No crackles or wheezes.  ?CVS: S1 &S2 heard. No murmur.  Regular rate and rhythm. ?Abdomen: Soft, nontender, nondistended.  Bowel sounds are heard.   ?Extremities: No cyanosis, clubbing or edema.  Peripheral pulses are palpable. ?Psych: Alert, awake and oriented to place time and person, poor short-term memory, poor insight and decreased attention span, ?CNS:  No cranial nerve deficits.  Power equal in all extremities.   ?Skin: Warm and dry.  No rashes noted. ? ? ?Data Reviewed:  ? ?CBC: ?Recent Labs  ?Lab 08/09/21 ?1044 08/09/21 ?1050 08/09/21 ?1156 08/10/21 ?1419  ?WBC 7.7  --   --  7.1  ?NEUTROABS 5.5  --   --   --   ?HGB 15.6* 17.3* 21.4* 14.6  ?HCT 48.2* 51.0* 63.0* 45.4  ?MCV 88.9  --   --  88.7  ?PLT 150  --   --  143*  ? ? ?Basic Metabolic Panel: ?Recent Labs  ?Lab 08/09/21 ?1044 08/09/21 ?1050 08/09/21 ?1156  ?NA 135 135 135  ?K 4.5 4.3 4.3  ?CL 96* 94*  --   ?CO2 29  --   --   ?GLUCOSE 311* 317*  --   ?BUN 9 10  --   ?CREATININE 0.63 0.50  --   ?CALCIUM 9.4  --   --   ? ? ?Liver Function Tests: ?Recent Labs  ?Lab 08/09/21 ?1044  ?AST 29  ?ALT 18  ?ALKPHOS 50  ?BILITOT 0.8  ?PROT 7.2  ?ALBUMIN 3.8  ? ? ? ?Radiology Studies: ?EEG adult ? ?Result Date: 08/09/2021 ?Lora Havens, MD     08/09/2021  1:48 PM Patient Name: SERIYAH COLLISON MRN: [id number] Epilepsy Attending: Lora Havens Referring Physician/Provider: Merrily Brittle, DO Date: 08/09/2021 Duration: 22.37 mins Patient history: 64 old female with new onset aphasia.  EEG to evaluate for seizure. Level of alertness: Awake AEDs during EEG study: None Technical aspects: This EEG study was done with scalp electrodes positioned according to the 10-20 International system of electrode placement. Electrical activity was acquired at a sampling rate of 500Hz  and reviewed with a high frequency filter of 70Hz  and a low frequency filter of  1Hz . EEG data were recorded continuously and digitally stored. Description: No clear posterior dominant rhythm was seen.  EEG showed continuous generalized and lateralized left hemisphere 3 to 7 Hz theta-delta slowing.  Hyperventilation and photic stimulation were not performed.   ABNORMALITY - Continuous slow, generalized and lateralized left hemisphere IMPRESSION: This study is suggestive of cortical dysfunction in left hemisphere which could be secondary to underlying structural abnormality, postictal state.  Additionally there is moderate diffuse encephalopathy, nonspecific etiology.  No seizures or epileptiform discharges were seen throughout the recording. Priyanka Barbra Sarks  ? ?ECHOCARDIOGRAM COMPLETE ? ?Result Date: 08/09/2021 ?   ECHOCARDIOGRAM REPORT   Patient Name:   KATALAYA BEEL Date of Exam: 08/09/2021 Medical Rec #:  [id number]        Height:       63.0 in Accession #:    [id number]       Weight:       142.0 lb Date of Birth:  [birth year]        BSA:          1.672 m? Patient Age:    72 years         BP:           136/63 mmHg Patient Gender: F                HR:           95 bpm. Exam Location:  Inpatient Procedure: 2D Echo, Cardiac Doppler and Color Doppler Indications:    TIA  History:        Patient has prior history of Echocardiogram examinations. COPD;                 Risk  Factors:Hypertension and Diabetes.  Sonographer:    Jyl Heinz Referring Phys: [place]  1. Left ventricular ejection fraction, by estimation, is 55 to 60%. The left ventricle has normal function. T

## 2021-08-11 NOTE — Progress Notes (Signed)
Pt willing to have MRI completed - Spoke with MRI team and MRI to be completed tomorrow, 4/7, due to scheduling.   ?

## 2021-08-12 ENCOUNTER — Other Ambulatory Visit: Payer: Self-pay | Admitting: Physician Assistant

## 2021-08-12 DIAGNOSIS — J449 Chronic obstructive pulmonary disease, unspecified: Secondary | ICD-10-CM | POA: Diagnosis not present

## 2021-08-12 DIAGNOSIS — R2990 Unspecified symptoms and signs involving the nervous system: Secondary | ICD-10-CM | POA: Diagnosis not present

## 2021-08-12 DIAGNOSIS — E782 Mixed hyperlipidemia: Secondary | ICD-10-CM | POA: Diagnosis not present

## 2021-08-12 DIAGNOSIS — I639 Cerebral infarction, unspecified: Secondary | ICD-10-CM | POA: Diagnosis not present

## 2021-08-12 DIAGNOSIS — G9341 Metabolic encephalopathy: Secondary | ICD-10-CM | POA: Diagnosis not present

## 2021-08-12 LAB — GLUCOSE, CAPILLARY
Glucose-Capillary: 272 mg/dL — ABNORMAL HIGH (ref 70–99)
Glucose-Capillary: 333 mg/dL — ABNORMAL HIGH (ref 70–99)
Glucose-Capillary: 310 mg/dL — ABNORMAL HIGH (ref 70–99)
Glucose-Capillary: 160 mg/dL — ABNORMAL HIGH (ref 70–99)

## 2021-08-12 MED ORDER — CLOPIDOGREL BISULFATE 75 MG PO TABS
75.0000 mg | ORAL_TABLET | Freq: Every day | ORAL | Status: DC
  Administered 2021-08-12 – 2021-08-13 (×2): 75 mg via ORAL
  Filled 2021-08-12 (×2): qty 1

## 2021-08-12 MED ORDER — ONDANSETRON HCL 4 MG/2ML IJ SOLN
4.0000 mg | Freq: Four times a day (QID) | INTRAMUSCULAR | Status: DC | PRN
Start: 2021-08-12 — End: 2021-08-13
  Administered 2021-08-12: 4 mg via INTRAVENOUS
  Filled 2021-08-12: qty 2

## 2021-08-12 NOTE — Progress Notes (Signed)
Occupational Therapy Treatment ?Patient Details ?Name: [name] ?MRN: [id number] ?DOB: 06-19-1935 ?Today's Date: 08/12/2021 ? ? ?History of present illness 86 y.o. female presenting with stroke-like symptoms/aphasia, encephalopathy. CT (-) for CVA; awaiting MRI due to pacemaker. Medical history significant of anxiety; COPD on home O2; DM; Glaucoma, spinal stenosis with neurogenic claudication, HTN; HLD; BPPV, Sjogren syndrome, pacemaker placement; dementia; and NASH ?  ?OT comments ? Pt demonstrating much improved strength, balance, and cognition this session. She continues to present with decreased awareness of safety and her deficits, however she reports that she understands she is a fall risk. Pt was able to complete multiple ADL's and functional mobility with min guard this session, no physical assist required, using RW. Pt participated in tasks OOB/standing for ~35 mins this session, with no rest break. Updated recommendation to max [facility] services, as long as pt is able to have increased time with aides. Due to limited safety awareness, if pt is unable to have increased assist/supervision at home, it will be safer and more beneficial for pt to receive SNF level therapies. Pt in agreement at end of session. OT will continue to follow.  ? ?Recommendations for follow up therapy are one component of a multi-disciplinary discharge planning process, led by the attending physician.  Recommendations may be updated based on patient status, additional functional criteria and insurance authorization. ?   ?Follow Up Recommendations ? Home health OT  ?  ?Assistance Recommended at Discharge Frequent or constant Supervision/Assistance  ?Patient can return home with the following ? A little help with walking and/or transfers;A little help with bathing/dressing/bathroom;Assistance with cooking/housework;Direct supervision/assist for medications management;Direct supervision/assist for financial management;Assist for  transportation ?  ?Equipment Recommendations ? None recommended by OT  ?  ?Recommendations for Other Services   ? ?  ?Precautions / Restrictions Precautions ?Precautions: Fall ?Restrictions ?Weight Bearing Restrictions: No  ? ? ?  ? ?Mobility Bed Mobility ?  ?  ?  ?  ?  ?  ?  ?General bed mobility comments: Pt received in recliner. ?  ? ?Transfers ?Overall transfer level: Needs assistance ?Equipment used: Rolling walker (2 wheels) ?Transfers: Sit to/from Stand ?Sit to Stand: Min guard ?  ?  ?  ?  ?  ?General transfer comment: increased time ?  ?  ?Balance Overall balance assessment: History of Falls, Needs assistance ?Sitting-balance support: No upper extremity supported, Feet supported ?Sitting balance-Leahy Scale: Good ?  ?  ?Standing balance support: Single extremity supported, During functional activity, Bilateral upper extremity supported ?Standing balance-Leahy Scale: Fair ?Standing balance comment: Able to complete grooming tasks at sink, by leaning into sink mildly, no UE support ?  ?  ?  ?  ?  ?  ?  ?  ?  ?  ?  ?   ? ?ADL either performed or assessed with clinical judgement  ? ?ADL Overall ADL's : Needs assistance/impaired ?  ?  ?Grooming: Wash/dry hands;Wash/dry face;Oral care;Brushing hair;Supervision/safety;Standing ?Grooming Details (indicate cue type and reason): completed at sink ?  ?  ?  ?  ?  ?  ?Lower Body Dressing: Min guard;Sitting/lateral leans;Sit to/from stand ?Lower Body Dressing Details (indicate cue type and reason): Pt doffed and donned socks and mesh underwear sitting and standing from recliner ?Toilet Transfer: Min guard;Ambulation ?Toilet Transfer Details (indicate cue type and reason): ambulated to bathroom ?Toileting- Clothing Manipulation and Hygiene: Supervision/safety;Sitting/lateral lean ?Toileting - Clothing Manipulation Details (indicate cue type and reason): no assist needed this session ?  ?  ?Functional mobility  during ADLs: Min guard;Rolling walker (2 wheels) ?General ADL  Comments: Improved balance this session, requiring less assist ?  ? ?Extremity/Trunk Assessment   ?  ?  ?  ?  ?  ? ?Vision   ?  ?  ?Perception   ?  ?Praxis   ?  ? ?Cognition Arousal/Alertness: Awake/alert ?Behavior During Therapy: [facility] for tasks assessed/performed ?Overall Cognitive Status: Impaired/Different from baseline ?Area of Impairment: Safety/judgement ?  ?  ?  ?  ?  ?  ?  ?  ?  ?  ?  ?  ?Safety/Judgement: Decreased awareness of safety, Decreased awareness of deficits ?  ?  ?General Comments: Tangential at times. Easily distracted. ?  ?  ?   ?Exercises   ? ?  ?Shoulder Instructions   ? ? ?  ?General Comments VSS on RA  ? ? ?Pertinent Vitals/ Pain       Pain Assessment ?Pain Assessment: No/denies pain ? ?Home Living   ?  ?  ?  ?  ?  ?  ?  ?  ?  ?  ?  ?  ?  ?  ?  ?  ?  ?  ? ?  ?Prior Functioning/Environment    ?  ?  ?  ?   ? ?Frequency ? Min 2X/week  ? ? ? ? ?  ?Progress Toward Goals ? ?OT Goals(current goals can now be found in the care plan section) ? Progress towards OT goals: Progressing toward goals ? ?Acute Rehab OT Goals ?Patient Stated Goal: To go home to her cats ?OT Goal Formulation: With patient ?Time For Goal Achievement: 08/24/21 ?Potential to Achieve Goals: Good ?ADL Goals ?Pt Will Perform Lower Body Bathing: with modified independence;sit to/from stand ?Pt Will Perform Lower Body Dressing: with modified independence;sit to/from stand ?Pt Will Transfer to Toilet: with modified independence;ambulating ?Pt Will Perform Toileting - Clothing Manipulation and hygiene: with modified independence;sit to/from stand ?Additional ADL Goal #1: Pt will verbalize 3 strategies to reduce risk of falls with min VC  ?Plan Discharge plan needs to be updated;Frequency remains appropriate   ? ?Co-evaluation ? ? ?   ?  ?  ?  ?  ? ?  ?AM-PAC OT "6 Clicks" Daily Activity     ?Outcome Measure ? ? Help from another person eating meals?: A Little ?Help from another person taking care of personal grooming?: A Little ?Help  from another person toileting, which includes using toliet, bedpan, or urinal?: A Little ?Help from another person bathing (including washing, rinsing, drying)?: A Lot ?Help from another person to put on and taking off regular upper body clothing?: A Little ?Help from another person to put on and taking off regular lower body clothing?: A Little ?6 Click Score: 17 ? ?  ?End of Session Equipment Utilized During Treatment: Gait belt;Rolling walker (2 wheels) ? ?OT Visit Diagnosis: Unsteadiness on feet (R26.81);Other abnormalities of gait and mobility (R26.89);Muscle weakness (generalized) (M62.81);History of falling (Z91.81);Other symptoms and signs involving the nervous system (R29.898);Other symptoms and signs involving cognitive function;Pain ?  ?Activity Tolerance Patient tolerated treatment well ?  ?Patient Left in chair;with call bell/phone within reach;with chair alarm set ?  ?Nurse Communication Mobility status ?  ? ?   ? ?Time: 3664-4034 ?OT Time Calculation (min): 44 min ? ?Charges: OT General Charges ?$OT Visit: 1 Visit ?OT Treatments ?$Self Care/Home Management : 23-37 mins ?$Therapeutic Activity: 8-22 mins ? ?Pranathi Winfree H., OTR/L ?Acute Rehabilitation ? ?Lilyanah Celestin Elane Yolanda Bonine ?08/12/2021, 7:59 PM ?

## 2021-08-12 NOTE — Progress Notes (Signed)
Inpatient Diabetes Program Recommendations ? ?AACE/ADA: New Consensus Statement on Inpatient Glycemic Control (2015) ? ?Target Ranges:  Prepandial:   less than 140 mg/dL ?     Peak postprandial:   less than 180 mg/dL (1-2 hours) ?     Critically ill patients:  140 - 180 mg/dL  ? ?Lab Results  ?Component Value Date  ? GLUCAP 333 (H) 08/12/2021  ? HGBA1C 15.0 (H) 08/09/2021  ? ? ?Review of Glycemic Control ? Latest Reference Range & Units 08/11/21 06:22 08/11/21 11:38 08/11/21 16:08 08/11/21 21:42 08/12/21 09:17 08/12/21 13:03  ?Glucose-Capillary 70 - 99 mg/dL 241 (H) 256 (H) 155 (H) 258 (H) 272 (H) 333 (H)  ?(H): Data is abnormally high ? ?Diabetes history: DM2 ?Outpatient Diabetes medications: Tresiba 36 units QHS, Novolog 12-14 units with breakfast, 14-16 units with lunch, 12-16 units with dinner ?Current orders for Inpatient glycemic control: Semglee 26 units QHS, Novolog 0-15 units TID ? ?Inpatient Diabetes Program Recommendations:   ? ?Semglee 32 units QHS ?Novolog 3 units TID with meals IF consumes at least 50% ? ?Will continue to follow while inpatient. ? ?Thank you, ?Reche Dixon, MSN, RN ?Diabetes Coordinator ?Inpatient Diabetes Program ?[phone] (team pager from 8a-5p) ? ? ? ? ?

## 2021-08-12 NOTE — Consult Note (Addendum)
[facility] New Face-to-Face Psychiatric Evaluation ? ? ?Service Date: August 13, 2021 ?LOS:  LOS: 3 days  ? ?Assessment  ?[name] is a 86 y.o. female admitted medically for 08/09/2021 10:39 AM for a stroke. She carries the psychiatric diagnoses of anxiety and has a past medical history of DMII, arthritis, BPPV< COPD on home O2, esophageal stricture, HTN, glaucoma, HLD, IBS, pacemaker, spinal stenosis, NASH.Psychiatry was consulted for capacity to refuse dc to SNF by [facility].  ? ?On my evaluation, pt was alert, oriented and did well on formal attention testing. She was able to name the risks of going home AMA despite SNF recommendation. This is a stark contrast from earlier evaluation by primary team. Given the apparent waxing and waning nature of cognitive sx, the patient's presentation is most consistent with delirium, most likely due to multiple etiologies including but not limited to infection, medications, pain, altered sleep/wake cycle, and limited mobility.  ? ?The patient would strongly benefit from medical treatment of UTI, as well as further investigation for etiologies of delirium, including residual effects of stroke. During this time period, minimization of delirogenic insults will be of utmost importance; this includes promoting the normal circadian cycle, minimizing lines/tubes, avoiding deliriogenic medications such as benzodiazepines and anticholinergic medications, and frequently reorienting the patient. Symptomatic treatment for agitation can be provided by antipsychotic medications, though it is important to remember that these do not treat the underlying etiology of delirium. Notably, there can be a time lag effect between treatment of a medical problem and resolution of delirium. This time lag effect may be of longer duration in the elderly, and those with underlying cognitive impairment or brain injury. ? ? ?08/13/21: Patient has demonstrated capacity to refuse SNF  discharge on multiple assessments despite resolving delirium and baseline poor cognitive functioning. She is aware of the recommendation to go to SNF and able to list risks of going home instead of following this recommendation. Additionally, has had improvements on OT testing since first evaluation. This is in line with her long-standing values on conversation with pt and review of the electronic medical record - has worked with PCP to secure a home aide rather than going to a SNF in the past and has had recent conversations with endocrinologist re: liberalizing blood sugars. Pt appears to have capacity to refuse dc to SNF at this time although certainly lacked it earlier in hospitalization per Dr. Ailene Rud progress notes. It should be noted that capacity is both decision- and time- specific and this should not be extrapolated to other medical decisions. Patient remains at high risk for future episodes of delirium/lack of capacity If there is ongoing concern about pt's ability to care for self outside the hospital SW should proceed with APS report.  ? ?Diagnoses:  ?Active Hospital problems: ?Principal Problem: ?  Ischemic stroke ([place]) ?Active Problems: ?  COPD mixed type ([place]) ?  Hyperlipidemia ?  Hypertension ?  Acute metabolic encephalopathy ?  Type 2 diabetes mellitus ([place]) ?  Pacemaker ?  Mild cognitive impairment with memory loss ?  Acute ischemic stroke ([place]) ?  ? ? ?Plan  ?## Safety and Observation Level:  ?- Based on my clinical evaluation, I estimate the patient to be at low risk of self harm in the current setting ?- At this time, we recommend a routine level of observation. This decision is based on my review of the chart including patient's history and current presentation, interview of the patient, mental status examination,  and consideration of suicide risk including evaluating suicidal ideation, plan, intent, suicidal or self-harm behaviors, risk factors, and protective factors. This judgment is  based on our ability to directly address suicide risk, implement suicide prevention strategies and develop a safety plan while the patient is in the clinical setting. Please contact our team if there is a concern that risk level has changed. ? ? ?## Medications:  ?-- None recommended ? ? ?## Medical Decision Making Capacity:  ? ?In an evaluation of capacity, each of the following criteria must be met based on medical ?necessity in order for a patient to have capacity to make the decision in question. Of note, the capacity evaluation assesses only for the specified decision documented above and is not a determination of the patient's overall competency, which can only be adjudicated. ? ?Criterion 1: The patient demonstrates a clear and consistent voluntary choice with regard to treatment options. Yes (would like to go home if possible) ? ?Criterion 2: The patient adequately understands the disease they have, the treatment proposed, the risks of treatment, and the risks of other treatment (including no treatment). Yes (see note below) ? ?Criterion 3: The patient acknowledges that the details of Criterion 2 apply to them specifically and the likely consequences of treatment options proposed. Yes ? ?Criterion 4: The patient demonstrates adequate reasoning/rationality within the context of their decision and can provide justification for their choice. Yes ? ?In this case, the patient had capacity to decide to refuse DC to SNF at the time of evaluation. However given difference between my assessment and Dr. Ailene Rud, as well as pt's mutliple risk factors for delirium, would recommend serial reassessment (unclear if pt's delirium is improving or she is continuing to wax and wane).  ? ? ? ?## Further Work-up:  ?-- Per primary ? ? ? ?-- most recent EKG on 4/4 had QtC of 510 (although pulse 100, likely overcorrection) ? ?## Disposition:  ?-- Per primary team ? ?Thank you for this consult request. Recommendations have been  communicated to the primary team.  We will continue to follow  at this time.  ? ?Joycelyn Schmid A Evanell Redlich ? ? ?New history  ?Relevant Aspects of Hospital Course:  ?Admitted on 08/09/2021 for strokelike symptoms. Was confused and not making sense on initial presentation to hospital. Was found to have a small stroke and a UTI Notably scored a 10/30 on SLUMS by speech therapy on 4/5 at which time she appeared to be significantly more cognitively impaired (did not repeat SLUMS but orientation and attention improved on my exam). At that time she told SLP she would consider SNF but has more recently been declining as she feels she has made significant improvements from her initial stroke symptoms.  ? ?Patient Report:  ?Saw pt in AM; she was having a pleasant conversation with her aide organizing affairs and making sure her cats were taken care of. Wrote down several numbers (brother, friend, and ex-husband) for her to call after friend located phonebook. Plans to call aide if she is discharged later today.  Wants to get out of here to settle some problems at home including feeding her cats. Apparently she is in some trouble at her apartment which bans cats. She remains aware that she is in overall poor health and likely to pass within the next few years whether she goes home or to a SNF (this is realistic given multiple medical comorbidities and age). Specifically she is aware that she is more likely to fall at  her house and that her diabetes is difficult to control no matter the location (this is consistent with her last outpt endocrinology note in which Dr. Dwyane Dee recommended liberalized blood sugar control). She becomes suspicious of this author's intent to keep in the hospital which is not unreasonable given purpose of consult; apologizes for this later.  ?  ?Repeated SLUMS; pt scored 7/30 with poor effort, often quit tasks early. Roughly equivalent to score of 10 3 days ago.  ? ?ROS:  ?No SI, HI, AH/VH.  ? ?Collateral  information:  ?From chart review ? ?Psychiatric History:  ?Information collected from pt, chart review ?Pt with personal history of depression, anxiety ?No hx manic or psychotic sx - did have isolated hallucina

## 2021-08-12 NOTE — Progress Notes (Signed)
Error

## 2021-08-12 NOTE — Progress Notes (Signed)
?PROGRESS NOTE ? ? ? ?[name]  MWU:[id number] DOB: 19-Sep-1935 DOA: 08/09/2021 ?PCP: Wardell Honour, MD  ? ? ?Brief Narrative:  ?[name] is a 86 y.o. female with past medical history significant for anxiety; COPD on home O2; diabetes mellitus type 2, hypertension, hyperlipidemia, status post pacemaker placement, dementia and NASH presented to the hospital with difficulty with speech with random and inappropriate words in the middle of conversation.  Patient's  aide went to check on her this and she was talking strange, wasn't putting words together well.  Patient also complained of chest pain and 911 was called in.  She has no family members but does have a back up contact, [name], [phone].  In the ED, CT scan was unremarkable.  Neurology recommended EEG MRI and mental status change work-up.  Patient was then admitted to the hospital for further evaluation and treatment. ?   ?Assessment and Plan: ? ?Principal Problem: ?  Ischemic stroke ([place]) ?Active Problems: ?  Acute metabolic encephalopathy ?  COPD mixed type ([place]) ?  Hyperlipidemia ?  Hypertension ?  Type 2 diabetes mellitus ([place]) ?  Pacemaker ?  Mild cognitive impairment with memory loss ?  Acute ischemic stroke ([place]) ?  ?Acute ischemic stroke ?Patient presented with aphasia.  Initially code stroke was called in.  Neurology was on board.  CT head without acute findings.  CTA neck showed no acute large vessel occlusion but 30% stenosis of the proximal ICA on the right and 60% stenosis on distal bulb on the left. MRI of the brain was then performed later due to pacemaker in place which showed a small restricted diffusion in the left temporoparietal area compatible with acute stroke.  EEG done showed cortical dysfunction without any seizures.  Communicated with neurology. Carotid duplex ultrasound showed some 50% stenosis of the external carotid artery and less than 39% stenosis of bilateral internal carotid artery. Recommendation is  aspirin Plavix for 3 weeks then Plavix alone with 30-day monitoring.. ? ?Acute metabolic encephalopathy ?Some concern for encephalopathy on background of dementia.  Patient does have short-term memory impairment and lacks insight to her problems..  EEG with cortical dysfunction  Urinalysis was abnormal and was empirically treated for possible cystitis with IV Rocephin.  Mildly elevated ammonia level with normal LFTs.  Blood cultures been negative in 24 hours.  Vitamin B-12 level at 456.  TSH of 1.6.  COVID and influenza was negative.  Urine drug screen was negative on presentation. ? ?Mild cognitive impairment with memory loss ?No family member to confirm that.  Admitting provider had spoken with home health nurse and a caseworker.  Likely baseline.  Patient does have poor insight to her medical problems and short-term memory loss. ?  ?History of complete heart block status post pacemaker ?  ?Type 2 diabetes mellitus ([place]) ?Previous hemoglobin  A1c was 11.6, repeat A1c at this time at 15.0.  Poorly controlled diabetes.  Looks like patient has not been taking care of herself at home.  Continue long-acting and sliding scale insulin at this time. ?  ?Hypertension ?Was on permissive hypertension.  Toprol-XL at home.  Currently on hold.  Could resume by a.m. ? ?Hyperlipidemia ?On Pravachol.  LDL cholesterol was 42. ?  ?COPD mixed type ([place]) ?Continue Duoneb and prn Albuterol. ? ?Deconditioning, debility. ?Patient seen by physical therapy and occasional therapy recommended skilled nursing facility placement.  Patient is refusing to wait.  Patient lacks insight to her medical problems and has memory loss.  Her diabetes has been worsening.  Unlikely that she is getting adequate care at home.  We will get a psychiatry evaluation for medical decision making capacity.  Might need guardianship.  ?  ? DVT prophylaxis: enoxaparin (LOVENOX) injection 40 mg Start: 08/09/21 1430 ? ? ?Code Status:   ?  Code Status: Full  Code ? ?Disposition:  ?Skilled nursing facility as per PT OT recommendation..Patient lives by herself at home and her diabetes is uncontrolled.  We will get psychiatry for decision-making CD evaluation since patient has poor short-term memory, insight to her problems. ? ?Status is: Inpatient ? ?The patient is inpatient because: Stroke work-up, MRI pending, need for rehabilitation. ? ? Family Communication:  ?None available ? ?Consultants:  ?Neurology ?We will consult psychiatry ? ?Procedures:  ?None ? ?Antimicrobials:  ?Rocephin IV 4/4> ? ?Anti-infectives (From admission, onward)  ? ? Start     Dose/Rate Route Frequency Ordered Stop  ? 08/09/21 2100  cefTRIAXone (ROCEPHIN) 1 g in sodium chloride 0.9 % 100 mL IVPB       ? 1 g ?200 mL/hr over 30 Minutes Intravenous  Once 08/09/21 2049 08/10/21 0801  ? ?  ? ?Subjective: ?Today, patient was seen and examined at bedside.  States that that nothing is wrong with her and she does not want any more tests.  Nobody has been able to figure out anything.  Repeatedly asks with what kind of doctor I am.  Does not understand why she is in the hospital.   ? ?Objective: ?Vitals:  ? 08/11/21 1938 08/12/21 0000 08/12/21 0317 08/12/21 0835  ?BP: 130/74 137/61 (!) 160/77 133/79  ?Pulse: (!) 105 100 100 (!) 113  ?Resp: 17 16 18 20   ?Temp: 98 ?F (36.7 ?C) 98 ?F (36.7 ?C) 98.6 ?F (37 ?C) 98.2 ?F (36.8 ?C)  ?TempSrc:   Oral Oral  ?SpO2: 95% 95% 93% 94%  ?Weight:      ? ? ?Intake/Output Summary (Last 24 hours) at 08/12/2021 1138 ?Last data filed at 08/12/2021 0909 ?Gross per 24 hour  ?Intake 646 ml  ?Output 250 ml  ?Net 396 ml  ? ?Filed Weights  ? 08/09/21 1124  ?Weight: 64.4 kg  ? ? ?Physical Examination: ? ?General:  Average built, not in obvious distress ?HENT:   No scleral pallor or icterus noted. Oral mucosa is moist.  ?Chest:  Clear breath sounds.  Diminished breath sounds bilaterally. No crackles or wheezes.  ?CVS: S1 &S2 heard. No murmur.  Regular rate and rhythm. ?Abdomen: Soft,  nontender, nondistended.  Bowel sounds are heard.   ?Extremities: No cyanosis, clubbing or edema.  Peripheral pulses are palpable. ?Psych: Alert, awake and communicative, oriented to time place and person, postpartum memory, poor insight and decreased attention span. ?CNS:  No cranial nerve deficits.  Power equal in all extremities.   ?Skin: Warm and dry.  No rashes noted. ? ?Data Reviewed:  ? ?CBC: ?Recent Labs  ?Lab 08/09/21 ?1044 08/09/21 ?1050 08/09/21 ?1156 08/10/21 ?1419  ?WBC 7.7  --   --  7.1  ?NEUTROABS 5.5  --   --   --   ?HGB 15.6* 17.3* 21.4* 14.6  ?HCT 48.2* 51.0* 63.0* 45.4  ?MCV 88.9  --   --  88.7  ?PLT 150  --   --  143*  ? ? ?Basic Metabolic Panel: ?Recent Labs  ?Lab 08/09/21 ?1044 08/09/21 ?1050 08/09/21 ?1156  ?NA 135 135 135  ?K 4.5 4.3 4.3  ?CL 96* 94*  --   ?CO2 29  --   --   ?  GLUCOSE 311* 317*  --   ?BUN 9 10  --   ?CREATININE 0.63 0.50  --   ?CALCIUM 9.4  --   --   ? ? ?Liver Function Tests: ?Recent Labs  ?Lab 08/09/21 ?1044  ?AST 29  ?ALT 18  ?ALKPHOS 50  ?BILITOT 0.8  ?PROT 7.2  ?ALBUMIN 3.8  ? ? ? ?Radiology Studies: ?MR BRAIN WO CONTRAST ? ?Result Date: 08/11/2021 ?CLINICAL DATA:  Acute neuro deficit. EXAM: MRI HEAD WITHOUT CONTRAST TECHNIQUE: Multiplanar, multiecho pulse sequences of the brain and surrounding structures were obtained without intravenous contrast. COMPARISON:  CT head 08/09/2021 FINDINGS: Brain: Axial and coronal diffusion weighted imaging only were performed. These are of diagnostic quality. The patient refused further imaging Diffusion-weighted imaging demonstrates a small area of restricted diffusion in the left temporoparietal cortex. IMPRESSION: Small area of restricted diffusion in the left temporoparietal cortex compatible with acute infarct. The patient refused further imaging. Electronically Signed   By: Franchot Gallo M.D.   On: 08/11/2021 15:13  ? ?VAS US CAROTID ? ?Result Date: 08/10/2021 ?Carotid Arterial Duplex Study Patient Name:  TESSLA SPURLING  Date of  Exam:   08/10/2021 Medical Rec #: [id number]         Accession #:    [id number] Date of Birth: 29-May-1935         Patient Gender: F Patient Age:   25 years Exam Location:  [facility] Procedure:      VAS US CAROTID Refer

## 2021-08-12 NOTE — Progress Notes (Signed)
Physical Therapy Treatment ?Patient Details ?Name: [name] ?MRN: [id number] ?DOB: [birth year] ?Today's Date: 08/12/2021 ? ? ?History of Present Illness 86 y.o. female presenting with stroke-like symptoms/aphasia, encephalopathy. CT (-) for CVA; awaiting MRI due to pacemaker. Medical history significant of anxiety; COPD on home O2; DM; Glaucoma, spinal stenosis with neurogenic claudication, HTN; HLD; BPPV, Sjogren syndrome, pacemaker placement; dementia; and NASH ? ?  ?PT Comments  ? ? Pt received in recliner. She required min guard assist transfers, and min guard assist ambulation 50' with RW. Poor standing balance and decreased safety awareness resulting in high risk for falls. Recommending SNF at d/c due to her high fall risk. She has had multiple falls at home PTA. At home, pt does have a PCA 8 hrs/day x 5 days, primarily for iADLs. If able to arrange at/near 24-hour assist at home, home with HHPT would be appropriate. ?  ?Recommendations for follow up therapy are one component of a multi-disciplinary discharge planning process, led by the attending physician.  Recommendations may be updated based on patient status, additional functional criteria and insurance authorization. ? ?Follow Up Recommendations ? Skilled nursing-short term rehab (<3 hours/day) ?  ?  ?Assistance Recommended at Discharge Frequent or constant Supervision/Assistance  ?Patient can return home with the following A little help with walking and/or transfers;A little help with bathing/dressing/bathroom;Assistance with cooking/housework;Direct supervision/assist for medications management;Direct supervision/assist for financial management;Assist for transportation;Help with stairs or ramp for entrance ?  ?Equipment Recommendations ? None recommended by PT  ?  ?Recommendations for Other Services   ? ? ?  ?Precautions / Restrictions Precautions ?Precautions: Fall ?Restrictions ?Weight Bearing Restrictions: No  ?  ? ?Mobility ? Bed Mobility ?  ?   ?  ?  ?  ?  ?  ?General bed mobility comments: Pt received in recliner. ?  ? ?Transfers ?Overall transfer level: Needs assistance ?Equipment used: Rolling walker (2 wheels) ?Transfers: Sit to/from Stand ?Sit to Stand: Min guard ?  ?  ?  ?  ?  ?General transfer comment: increased time ?  ? ?Ambulation/Gait ?Ambulation/Gait assistance: Min guard ?Gait Distance (Feet): 50 Feet ?Assistive device: Rolling walker (2 wheels) ?Gait Pattern/deviations: Step-through pattern, Decreased stride length, Drifts right/left ?Gait velocity: decreased ?Gait velocity interpretation: <1.8 ft/sec, indicate of risk for recurrent falls ?  ?General Gait Details: cues to stay close to RW, mildly unsteady ? ? ?Stairs ?  ?  ?  ?  ?  ? ? ?Wheelchair Mobility ?  ? ?Modified Rankin (Stroke Patients Only) ?  ? ? ?  ?Balance Overall balance assessment: History of Falls, Needs assistance ?Sitting-balance support: No upper extremity supported, Feet supported ?Sitting balance-Leahy Scale: Fair ?  ?  ?Standing balance support: Bilateral upper extremity supported, Reliant on assistive device for balance, During functional activity ?Standing balance-Leahy Scale: Poor ?  ?  ?  ?  ?  ?  ?  ?  ?  ?  ?  ?  ?  ? ?  ?Cognition Arousal/Alertness: Awake/alert ?Behavior During Therapy: [facility] for tasks assessed/performed ?Overall Cognitive Status: Impaired/Different from baseline ?Area of Impairment: Attention, Memory, Safety/judgement, Problem solving, Awareness ?  ?  ?  ?  ?  ?  ?  ?  ?  ?Current Attention Level: Selective ?Memory: Decreased recall of precautions, Decreased short-term memory ?  ?Safety/Judgement: Decreased awareness of safety, Decreased awareness of deficits ?Awareness: Emergent ?Problem Solving: Slow processing, Difficulty sequencing ?General Comments: Tangential at times. Easily distracted. ?  ?  ? ?  ?Exercises   ? ?  ?  General Comments   ?  ?  ? ?Pertinent Vitals/Pain Pain Assessment ?Pain Assessment: No/denies pain  ? ? ?Home Living   ?  ?  ?   ?  ?  ?  ?  ?  ?  ?   ?  ?Prior Function    ?  ?  ?   ? ?PT Goals (current goals can now be found in the care plan section) Acute Rehab PT Goals ?Patient Stated Goal: home to her cats ?Progress towards PT goals: Progressing toward goals ? ?  ?Frequency ? ? ? Min 3X/week ? ? ? ?  ?PT Plan Current plan remains appropriate  ? ? ?Co-evaluation   ?  ?  ?  ?  ? ?  ?AM-PAC PT "6 Clicks" Mobility   ?Outcome Measure ? Help needed turning from your back to your side while in a flat bed without using bedrails?: A Little ?Help needed moving from lying on your back to sitting on the side of a flat bed without using bedrails?: A Little ?Help needed moving to and from a bed to a chair (including a wheelchair)?: A Little ?Help needed standing up from a chair using your arms (e.g., wheelchair or bedside chair)?: A Little ?Help needed to walk in hospital room?: A Little ?Help needed climbing 3-5 steps with a railing? : A Lot ?6 Click Score: 17 ? ?  ?End of Session Equipment Utilized During Treatment: Gait belt ?Activity Tolerance: Patient tolerated treatment well ?Patient left: in chair;with call bell/phone within reach;with chair alarm set ?Nurse Communication: Mobility status ?PT Visit Diagnosis: Other abnormalities of gait and mobility (R26.89);Muscle weakness (generalized) (M62.81);Difficulty in walking, not elsewhere classified (R26.2) ?  ? ? ?Time: 1594-5859 ?PT Time Calculation (min) (ACUTE ONLY): 17 min ? ?Charges:  $Gait Training: 8-22 mins          ?          ? ?Lorrin Goodell, PT  ?Office # [phone] ?Pager [phone] ? ? ? ?Lorriane Shire ?08/12/2021, 11:43 AM ? ?

## 2021-08-12 NOTE — Progress Notes (Signed)
Late entry for evaluation that was completed by Donnella Sham, PT but not pulled into a note on 08/10/21.   ? ? ? 08/10/21 1208  ?PT Visit Information  ?Last PT Received On 08/10/21  ?Assistance Needed +1  ?History of Present Illness 86 y.o. female presenting with stroke-like symptoms/aphasia, encephalopathy. CT (-) for CVA; awaiting MRI due to pacemaker. Medical history significant of anxiety; COPD on home O2; DM; Glaucoma, spinal stenosis with neurogenic claudication, HTN; HLD; BPPV, Sjogren syndrome, pacemaker placement; dementia; and NASH  ?Precautions  ?Precautions Fall  ?Restrictions  ?Weight Bearing Restrictions No  ?Home Living  ?Family/patient expects to be discharged to: Private residence  ?Living Arrangements Alone  ?Available Help at Discharge Available PRN/intermittently;Personal care attendant  ?Type of Home Apartment  ?Home Access Stairs to enter  ?Entrance Stairs-Number of Steps 2  ?Entrance Stairs-Rails Can reach both  ?Home Layout One level  ?Bathroom Shower/Tub Tub/shower unit  ?Bathroom Toilet Standard  ?Bathroom Accessibility No  ?Home Metallurgist (2 wheels);Shower seat ?("little walker")  ?Prior Function  ?Prior Level of Function  Needs assist  ? Cognitive Assist  ADLs (cognitive)  ?ADLs Comments PCA assists wtih cleaning pt's house adn cooking as needed; pt statse she manages her own medication adn drives occasionally  ?Communication  ?Communication Expressive difficulties  ?Pain Assessment  ?Pain Assessment Faces  ?Faces Pain Scale 2  ?Pain Location "butt"  ?Pain Descriptors / Indicators Discomfort;Grimacing;Sore  ?Pain Intervention(s) Monitored during session  ?Cognition  ?Arousal/Alertness Awake/alert  ?Behavior During Therapy Restless;WFL for tasks assessed/performed ?(at times)  ?Overall Cognitive Status Impaired/Different from baseline  ?Area of Impairment Orientation;Attention;Memory;Safety/judgement;Awareness;Problem solving  ?Orientation Level Disoriented to;Time  ?Current  Attention Level Selective  ?Memory Decreased recall of precautions;Decreased short-term memory  ?Safety/Judgement Decreased awareness of safety;Decreased awareness of deficits  ?Awareness Emergent  ?Problem Solving Slow processing  ?General Comments Unaware of being incontinent of BM; tangential at times; Difficulty with word finding at times  ?Upper Extremity Assessment  ?Upper Extremity Assessment Generalized weakness  ?Lower Extremity Assessment  ?Lower Extremity Assessment Generalized weakness  ?Cervical / Trunk Assessment  ?Cervical / Trunk Assessment Kyphotic  ?Transfers  ?Overall transfer level Needs assistance  ?Equipment used Rolling walker (2 wheels)  ?Transfers Sit to/from Stand  ?Sit to Stand Min assist  ?General transfer comment from toilet height  ?Ambulation/Gait  ?Ambulation/Gait assistance Min assist  ?Gait Distance (Feet) 60 Feet  ?Assistive device Rolling walker (2 wheels)  ?Gait Pattern/deviations Step-through pattern  ?General Gait Details mildly unsteady in the RW, but other than drifting, not overt deviation.  slower cadence.  ?Gait velocity interpretation <1.8 ft/sec, indicate of risk for recurrent falls  ?Balance  ?Overall balance assessment History of Falls;Needs assistance  ?Sitting balance-Leahy Scale Fair  ?Standing balance-Leahy Scale Poor  ?Standing balance comment reliant on external support and/or AD  ?PT - End of Session  ?Activity Tolerance Patient tolerated treatment well  ?Patient left in chair;with call bell/phone within reach;with chair alarm set  ?Nurse Communication Mobility status  ?PT Assessment  ?PT Recommendation/Assessment Patient needs continued PT services  ?PT Visit Diagnosis Other abnormalities of gait and mobility (R26.89);Muscle weakness (generalized) (M62.81);Difficulty in walking, not elsewhere classified (R26.2)  ?PT Problem List Decreased strength;Decreased activity tolerance;Decreased balance;Decreased mobility;Decreased knowledge of use of DME  ?PT Plan  ?PT  Frequency (ACUTE ONLY) Min 3X/week  ?PT Treatment/Interventions (ACUTE ONLY) Gait training;Functional mobility training;Therapeutic activities;Balance training;Patient/family education;DME instruction  ?AM-PAC PT "6 Clicks" Mobility Outcome Measure (Version 2)  ?Help needed turning from your back  to your side while in a flat bed without using bedrails? 2  ?Help needed moving from lying on your back to sitting on the side of a flat bed without using bedrails? 2  ?Help needed moving to and from a bed to a chair (including a wheelchair)? 3  ?Help needed standing up from a chair using your arms (e.g., wheelchair or bedside chair)? 3  ?Help needed to walk in hospital room? 3  ?Help needed climbing 3-5 steps with a railing?  3  ?6 Click Score 16  ?Consider Recommendation of Discharge To: Home with HH  ?Progressive Mobility  ?What is the highest level of mobility based on the progressive mobility assessment? Level 5 (Walks with assist in room/hall) - Balance while stepping forward/back and can walk in room with assist - Complete  ?Activity Ambulated with assistance in hallway  ?PT Recommendation  ?Follow Up Recommendations Skilled nursing-short term rehab (<3 hours/day)  ?Assistance recommended at discharge Frequent or constant Supervision/Assistance  ?Patient can return home with the following A little help with walking and/or transfers;A little help with bathing/dressing/bathroom;Assistance with cooking/housework;Direct supervision/assist for medications management;Direct supervision/assist for financial management;Assist for transportation;Help with stairs or ramp for entrance  ?Functional Status Assessment Patient has had a recent decline in their functional status and demonstrates the ability to make significant improvements in function in a reasonable and predictable amount of time.  ?Individuals Consulted  ?Consulted and Agree with Results and Recommendations Patient  ?Acute Rehab PT Goals  ?Patient Stated Goal I  want to go home.  ?PT Goal Formulation Patient unable to participate in goal setting  ?Time For Goal Achievement 08/24/21  ?Potential to [place]  ?PT Time Calculation  ?PT Start Time (ACUTE ONLY) 1010  ?PT Stop Time (ACUTE ONLY) 1044  ?PT Time Calculation (min) (ACUTE ONLY) 34 min  ?PT General Charges  ?$$ ACUTE PT VISIT 1 Visit  ?PT Evaluation  ?$PT Eval Moderate Complexity 1 Mod  ?PT Treatments  ?$Gait Training 8-22 mins  ?Written Expression  ?Dominant Hand Right  ? ?Lorrin Goodell, PT  ?Office # [phone] ?Pager [phone]  ?

## 2021-08-12 NOTE — Progress Notes (Signed)
STROKE TEAM PROGRESS NOTE  ? ? ? ?INTERVAL HISTORY ?Patient is sitting in a bedside chair.   MRI scan of the brain shows tiny punctate left temporal cortical infarct.  EEG showed focal left temporal slowing. ?OBJECTIVE ?Vitals:  ? 08/12/21 0000 08/12/21 0317 08/12/21 0835 08/12/21 1304  ?BP: 137/61 (!) 160/77 133/79 127/70  ?Pulse: 100 100 (!) 113 (!) 106  ?Resp: 16 18 20 20   ?Temp: 98 ?F (36.7 ?C) 98.6 ?F (37 ?C) 98.2 ?F (36.8 ?C) 98.6 ?F (37 ?C)  ?TempSrc:  Oral Oral Oral  ?SpO2: 95% 93% 94% 93%  ?Weight:      ? ? ?CBC:  ?Recent Labs  ?Lab 08/09/21 ?1044 08/09/21 ?1050 08/09/21 ?1156 08/10/21 ?1419  ?WBC 7.7  --   --  7.1  ?NEUTROABS 5.5  --   --   --   ?HGB 15.6*   < > 21.4* 14.6  ?HCT 48.2*   < > 63.0* 45.4  ?MCV 88.9  --   --  88.7  ?PLT 150  --   --  143*  ? < > = values in this interval not displayed.  ? ? ?Basic Metabolic Panel:  ?Recent Labs  ?Lab 08/09/21 ?1044 08/09/21 ?1050 08/09/21 ?1156  ?NA 135 135 135  ?K 4.5 4.3 4.3  ?CL 96* 94*  --   ?CO2 29  --   --   ?GLUCOSE 311* 317*  --   ?BUN 9 10  --   ?CREATININE 0.63 0.50  --   ?CALCIUM 9.4  --   --   ? ? ?Lipid Panel:  ?   ?Component Value Date/Time  ? CHOL 90 08/09/2021 1315  ? TRIG 50 08/09/2021 1315  ? HDL 38 (L) 08/09/2021 1315  ? CHOLHDL 2.4 08/09/2021 1315  ? VLDL 10 08/09/2021 1315  ? LDLCALC 42 08/09/2021 1315  ? [place] 48 05/31/2021 1119  ? ?HgbA1c:  ?Lab Results  ?Component Value Date  ? HGBA1C 15.0 (H) 08/09/2021  ? ?Urine Drug Screen:  ?   ?Component Value Date/Time  ? LABOPIA NONE DETECTED 08/09/2021 1421  ? COCAINSCRNUR NONE DETECTED 08/09/2021 1421  ? LABBENZ NONE DETECTED 08/09/2021 1421  ? AMPHETMU NONE DETECTED 08/09/2021 1421  ? THCU NONE DETECTED 08/09/2021 1421  ? LABBARB NONE DETECTED 08/09/2021 1421  ?  ?Alcohol Level No results found for: ETH ? ?IMAGING ? ? ?MR BRAIN WO CONTRAST ? ?Result Date: 08/11/2021 ?CLINICAL DATA:  Acute neuro deficit. EXAM: MRI HEAD WITHOUT CONTRAST TECHNIQUE: Multiplanar, multiecho pulse sequences of the  brain and surrounding structures were obtained without intravenous contrast. COMPARISON:  CT head 08/09/2021 FINDINGS: Brain: Axial and coronal diffusion weighted imaging only were performed. These are of diagnostic quality. The patient refused further imaging Diffusion-weighted imaging demonstrates a small area of restricted diffusion in the left temporoparietal cortex. IMPRESSION: Small area of restricted diffusion in the left temporoparietal cortex compatible with acute infarct. The patient refused further imaging. Electronically Signed   By: Franchot Gallo M.D.   On: 08/11/2021 15:13  ? ?VAS US CAROTID ? ?Result Date: 08/10/2021 ?Carotid Arterial Duplex Study Patient Name:  [name]  Date of Exam:   08/10/2021 Medical Rec #: [id number]         Accession #:    [id number] Date of Birth: 13-May-1935         Patient Gender: F Patient Age:   86 years Exam Location:  [facility] Procedure:      VAS US CAROTID Referring Phys: ERIC  LINDZEN --------------------------------------------------------------------------------  Indications:      CVA. Risk Factors:     Hypertension, hyperlipidemia, Diabetes, past history of                   smoking. Comparison Study: 08-09-2021 CTA Neck Performing Technologist: Darlin Coco RDMS, RVT  Examination Guidelines: A complete evaluation includes B-mode imaging, spectral Doppler, color Doppler, and power Doppler as needed of all accessible portions of each vessel. Bilateral testing is considered an integral part of a complete examination. Limited examinations for reoccurring indications may be performed as noted.  Right Carotid Findings: +----------+--------+--------+--------+------------------+---------------------+           PSV cm/sEDV cm/sStenosisPlaque DescriptionComments              +----------+--------+--------+--------+------------------+---------------------+ CCA Prox  47      9                                                        +----------+--------+--------+--------+------------------+---------------------+ CCA Distal42      11              calcific                                +----------+--------+--------+--------+------------------+---------------------+ ICA Prox  100     26      1-39%   calcific          Velocities may                                                            underestimate degree                                                      of stenosis due to                                                        more proximal                                                             obstruction.          +----------+--------+--------+--------+------------------+---------------------+ ICA Distal60      16                                                      +----------+--------+--------+--------+------------------+---------------------+ ECA       133             >  50%    calcific                                +----------+--------+--------+--------+------------------+---------------------+ +----------+--------+-------+----------------+-------------------+           PSV cm/sEDV cmsDescribe        Arm Pressure (mmHG) +----------+--------+-------+----------------+-------------------+ [id number]            Multiphasic, WNL                    +----------+--------+-------+----------------+-------------------+ +---------+--------+--+--------+-+---------+ VertebralPSV cm/s27EDV cm/s8Antegrade +---------+--------+--+--------+-+---------+  Left Carotid Findings: +----------+--------+--------+--------+-------------------------+--------+           PSV cm/sEDV cm/sStenosisPlaque Description       Comments +----------+--------+--------+--------+-------------------------+--------+ CCA Prox  57      13                                                +----------+--------+--------+--------+-------------------------+--------+ CCA Distal53      14               heterogenous and calcific         +----------+--------+--------+--------+-------------------------+--------+ ICA Prox  40      13              calcific                          +----------+--------+--------+--------+-------------------------+--------+ ICA Distal67      22                                                +----------+--------+--------+--------+-------------------------+--------+ ECA       205             >50%    calcific                          +----------+--------+--------+--------+-------------------------+--------+ +----------+--------+--------+----------------+-------------------+           PSV cm/sEDV cm/sDescribe        Arm Pressure (mmHG) +----------+--------+--------+----------------+-------------------+ [id number]             Multiphasic, WNL                    +----------+--------+--------+----------------+-------------------+ +---------+--------+--+--------+--+---------+ VertebralPSV cm/s74EDV cm/s11Antegrade +---------+--------+--+--------+--+---------+   Summary: Right Carotid: Velocities in the right ICA are consistent with a 1-39% stenosis.                The ECA appears >50% stenosed. Left Carotid: Velocities in the left ICA are consistent with a 1-39% stenosis.               The ECA appears >50% stenosed. Vertebrals:  Bilateral vertebral arteries demonstrate antegrade flow. Subclavians: Normal flow hemodynamics were seen in bilateral subclavian              arteries. *See table(s) above for measurements and observations.     Preliminary    ? ? ?Bilateral Carotid Dopplers  ?Bilateral 1-39% ICA stenosis.  Greater than 50% bilateral ECA stenosis. ? ?ECG - paced 100 BPM. (See cardiology reading for complete details) ? ?PHYSICAL EXAM ?Blood pressure 127/70, pulse (!) 106, temperature 98.6 ?F (37 ?C), temperature  source Oral, resp. rate 20, weight 64.4 kg, SpO2 93 %. ?Pleasant elderly Caucasian lady not in distress. . Afebrile. Head is  nontraumatic. Neck is supple without bruit.    Cardiac exam no murmur or gallop. Lungs are clear to auscultation. Distal pulses are well felt.  ? ?Neurological Exam: ?She is awake alert oriented to place and person.  She has diminished attention, registration and recall.  Poor short-term memory.  Poor insight into condition.  Poor recall 0/3.  Able to name only 5 animal

## 2021-08-13 DIAGNOSIS — E782 Mixed hyperlipidemia: Secondary | ICD-10-CM | POA: Diagnosis not present

## 2021-08-13 DIAGNOSIS — I639 Cerebral infarction, unspecified: Secondary | ICD-10-CM | POA: Diagnosis not present

## 2021-08-13 DIAGNOSIS — J449 Chronic obstructive pulmonary disease, unspecified: Secondary | ICD-10-CM | POA: Diagnosis not present

## 2021-08-13 DIAGNOSIS — R410 Disorientation, unspecified: Secondary | ICD-10-CM

## 2021-08-13 DIAGNOSIS — G9341 Metabolic encephalopathy: Secondary | ICD-10-CM | POA: Diagnosis not present

## 2021-08-13 LAB — BASIC METABOLIC PANEL
Anion gap: 6 (ref 5–15)
BUN: 10 mg/dL (ref 8–23)
CO2: 34 mmol/L — ABNORMAL HIGH (ref 22–32)
Calcium: 9.7 mg/dL (ref 8.9–10.3)
Chloride: 96 mmol/L — ABNORMAL LOW (ref 98–111)
Creatinine, Ser: 0.54 mg/dL (ref 0.44–1.00)
GFR, Estimated: >60 mL/min (ref >60)
Glucose, Bld: 166 mg/dL — ABNORMAL HIGH (ref 70–99)
Potassium: 4.2 mmol/L (ref 3.5–5.1)
Sodium: 136 mmol/L (ref 135–145)

## 2021-08-13 LAB — GLUCOSE, CAPILLARY
Glucose-Capillary: 180 mg/dL — ABNORMAL HIGH (ref 70–99)
Glucose-Capillary: 172 mg/dL — ABNORMAL HIGH (ref 70–99)
Glucose-Capillary: 263 mg/dL — ABNORMAL HIGH (ref 70–99)

## 2021-08-13 LAB — CBC
HCT: 45.7 % (ref 36.0–46.0)
Hemoglobin: 14.8 g/dL (ref 12.0–15.0)
MCH: 28.6 pg (ref 26.0–34.0)
MCHC: 32.4 g/dL (ref 30.0–36.0)
MCV: 88.2 fL (ref 80.0–100.0)
Platelets: 142 10*3/uL — ABNORMAL LOW (ref 150–400)
RBC: 5.18 MIL/uL — ABNORMAL HIGH (ref 3.87–5.11)
RDW: 13.5 % (ref 11.5–15.5)
WBC: 7.1 10*3/uL (ref 4.0–10.5)
nRBC: 0 % (ref 0.0–0.2)

## 2021-08-13 MED ORDER — CEPHALEXIN 500 MG PO CAPS: 500.0000 mg | ORAL_CAPSULE | Freq: Three times a day (TID) | ORAL | 0 refills | Status: AC

## 2021-08-13 MED ORDER — PANTOPRAZOLE SODIUM 40 MG PO TBEC: 40.0000 mg | DELAYED_RELEASE_TABLET | Freq: Every day | ORAL | 1 refills | Status: AC

## 2021-08-13 MED ORDER — THIAMINE HCL 100 MG PO TABS
100.0000 mg | ORAL_TABLET | Freq: Every day | ORAL | Status: AC
Start: 2021-08-14 — End: ?

## 2021-08-13 MED ORDER — ADULT MULTIVITAMIN W/MINERALS CH: 1.0000 | ORAL_TABLET | Freq: Every day | ORAL | 0 refills | Status: AC

## 2021-08-13 MED ORDER — CLOPIDOGREL BISULFATE 75 MG PO TABS: 75.0000 mg | ORAL_TABLET | Freq: Every day | ORAL | 2 refills | Status: AC

## 2021-08-13 MED ORDER — ASPIRIN EC 81 MG PO TBEC: 81.0000 mg | DELAYED_RELEASE_TABLET | Freq: Every day | ORAL | Status: AC

## 2021-08-13 NOTE — TOC Transition Note (Signed)
Transition of Care (TOC) - CM/SW Discharge Note ? ? ?Patient Details  ?Name: [name] ?MRN: [id number] ?Date of Birth: [birth year] ? ?Transition of Care (TOC) CM/SW Contact:  ?Carles Collet, RN ?Phone Number: ?08/13/2021, 2:56 PM ? ? ?Clinical Narrative:    ?Spoke w patient at bedside. ?She states that she has RW and all needed DME at home including home oxygen, patient has been on RA and was on RA when I was with her. ?Also spoke with friend Louie Casa at [phone] who will be arranging her transportation home. Either Louie Casa or another friend will be by the hospital in  a few hours to pick her up, nurse Kayla at bedside and aware.  ?Patient agreeable to [facility] services, per MD has been cleared by psych and is ok to DC back home. Requested MD to write for [facility] PT OT RN CSW.  ?Referral made to [facility] for [facility] services. ? ?Called contact [place], [phone], no answer on unidentified line.   ?Called CAP case manager, Ms. Scanlon, [phone], no answer, unidentified line.  ? ? ? ? ? ?Final next level of care: [place] ?Barriers to Discharge: No Barriers Identified ? ? ?Patient Goals and CMS Choice ?Patient states their goals for this hospitalization and ongoing recovery are:: to be less dizzy, get back home to her cat ?CMS Medicare.gov Compare Post Acute Care list provided to:: Patient ?Choice offered to / list presented to : Patient ? ?Discharge Placement ?  ?           ?  ?  ?  ?  ? ?Discharge Plan and Services ?  ?  ?Post Acute Care Choice: NA          ?  ?  ?  ?  ?  ?HH Arranged: RN, PT, OT, Nurse's Aide, Social Work ?[place] Agency: [place] ?Date HH Agency Contacted: 08/13/21 ?Time [place]: 2229 ?Representative spoke with at [place]: Tommi Rumps ? ?Social Determinants of Health (SDOH) Interventions ?  ? ? ?Readmission Risk Interventions ?   ? View : No data to display.  ?  ?  ?  ? ? ? ? ? ?

## 2021-08-13 NOTE — Progress Notes (Signed)
[name] to be D/C'd Home with home health per MD order.  Discussed with the patient and all questions fully answered. ? ?VSS, Skin clean, dry and intact without evidence of skin break down, no evidence of skin tears noted. ?IV catheter discontinued intact. Site without signs and symptoms of complications. Dressing and pressure applied. ? ?An After Visit Summary was printed and given to the patient. Patient received prescription. ? ?D/c education completed with patient/family including follow up instructions, medication list, d/c activities limitations if indicated, with other d/c instructions as indicated by MD - patient able to verbalize understanding, all questions fully answered.  ? ?Patient instructed to return to ED, call 911, or call MD for any changes in condition.  ? ?Patient escorted via [place], and D/C home via private auto. ? ?Manuella Ghazi ?08/13/2021 5:09 PM  ?

## 2021-08-13 NOTE — Discharge Summary (Addendum)
?Physician Discharge Summary ?  ?Patient: [name] MRN: [id number] DOB: Dec 29, 1935  ?Admit date:     08/09/2021  ?Discharge date: 08/13/2021  ?Discharge Physician: Corrie Mckusick Nephtali Docken  ? ?PCP: Wardell Honour, MD  ? ?Recommendations at discharge:  ? ?Follow-up with primary care physician as outpatient.  Patient will need adequate monitoring of her diabetes as outpatient. ? ?Discharge Diagnoses: ?Principal Problem: ?  Ischemic stroke ([place]) ?Active Problems: ?  Acute metabolic encephalopathy ?  COPD mixed type ([place]) ?  Hyperlipidemia ?  Hypertension ?  Type 2 diabetes mellitus ([place]) ?  Pacemaker ?  Mild cognitive impairment with memory loss ?  Acute ischemic stroke ([place]) ? ?Resolved Problems: ?  * No resolved hospital problems. * ? ?Hospital Course: ?[name] is a 86 y.o. female with past medical history significant for anxiety; COPD on home O2; diabetes mellitus type 2, hypertension, hyperlipidemia, status post pacemaker placement, dementia and NASH presented to the hospital with difficulty with speech with random and inappropriate words in the middle of conversation.  Patient's  aide went to check on her this and she was talking strange, wasn't putting words together well.  Patient also complained of chest pain and 911 was called in.  She has no family members but does have a back up contact, [name], [phone].  In the ED, CT scan was unremarkable.  Neurology recommended EEG MRI and mental status change work-up.  Patient was then admitted to the hospital for further evaluation and treatment. ?   ?Assessment and Plan: ? ?Principal Problem: ?  Ischemic stroke ([place]) ?Active Problems: ?  Acute metabolic encephalopathy ?  COPD mixed type ([place]) ?  Hyperlipidemia ?  Hypertension ?  Type 2 diabetes mellitus ([place]) ?  Pacemaker ?  Mild cognitive impairment with memory loss ?  Acute ischemic stroke ([place]) ?  ?Acute ischemic stroke ?Patient presented with aphasia.  Initially code stroke was called in.   Neurology was on board.  CT head without acute findings.  CTA neck showed no acute large vessel occlusion but 30% stenosis of the proximal ICA on the right and 60% stenosis on distal bulb on the left. MRI of the brain was then performed later due to pacemaker in place which showed a small restricted diffusion in the left temporoparietal area compatible with acute stroke.  EEG done showed cortical dysfunction without any seizures.  Communicated with neurology. Carotid duplex ultrasound showed some 50% stenosis of the external carotid artery and less than 39% stenosis of bilateral internal carotid artery. Recommendation is aspirin Plavix for 3 weeks then Plavix alone.  Pacemaker interrogation did not reveal  atrial fibrillation. ? ?Acute metabolic encephalopathy ?Likely at baseline at this time.  Some initial concern for encephalopathy on background of dementia.  Patient does have short-term memory impairment.  EEG with cortical dysfunction  Urinalysis was abnormal and was empirically given 1 dose of IV Rocephin.    Urine culture was with mixed organisms.  But patient is complaining of dysuria and frequency.  We will keep Keflex on discharge for 5 days to complete the course.  Mildly elevated ammonia level with normal LFTs.  Blood cultures was negative.  Vitamin B-12 level at 456.  TSH of 1.6.  COVID and influenza was negative.  Urine drug screen was negative on presentation.  Psychiatry has also seen the patient.  At this time patient is able to make her own decisions. ? ?Possible cystitis.  We will continue Keflex on discharge to complete the course. ? ?  Mild cognitive impairment with memory loss ?No family member to confirm that.  Admitting provider had spoken with home health nurse and a caseworker.  He is likely at baseline..  Patient does have short-term memory loss. ?  ?History of complete heart block status post pacemaker. ?Follows up with cardiology as outpatient ? ?Type 2 diabetes mellitus ([place]) ?Previous  hemoglobin  A1c was 11.6, repeat A1c at this time at 15.0.  Poorly controlled diabetes.  Looks like patient has not been taking care of herself at home.  Continue long-acting and sliding scale insulin at this time.  I spoke with the patient regarding the need for compliance with medication and follow-up with Dr. Dwyane Dee for her endocrinologist as outpatient. ?  ?Hypertension ?  Toprol-XL at home.  Will resume on discharge. ? ?Hyperlipidemia ?On Pravachol.  LDL cholesterol was 42. ?  ?COPD mixed type ([place]) ?Continue bronchodilators from home. ? ?Deconditioning, debility. ?Patient seen by physical therapy and occasional therapy recommended skilled nursing facility placement.  Patient is refusing placement.  Psychiatry has seen the patient at this time.  Patient is able to make her own decisions at this time.  She insist on getting discharged home.  No arrange for home health PT OT RN and social work on discharge.   ? ?Consultants:  ?Neurology ?Psychiatry ? ?Procedures performed: Pacemaker interrogation ? ?Disposition: Home health ?Diet recommendation:  ?Discharge Diet Orders (From admission, onward)  ? ?  Start     Ordered  ? 08/13/21 0000  Diet - low sodium heart healthy       ? 08/13/21 1434  ? 08/13/21 0000  Diet Carb Modified       ? 08/13/21 1434  ? ?  ?  ? ?  ? ?Carb modified diet ?DISCHARGE MEDICATION: ?Allergies as of 08/13/2021   ? ?   Reactions  ? Chlordiazepoxide-clidinium Other (See Comments)  ? Sleepy,weak  ? Biaxin [clarithromycin] Other (See Comments)  ? Foul taste, abd pain, diarrhea  ? Tramadol Other (See Comments)  ? Caused tremors  ? Codeine Other (See Comments)  ? Upset stomach  ? ?  ? ?  ?Medication List  ?  ? ?STOP taking these medications   ? ?albuterol 108 (90 Base) MCG/ACT inhaler ?Commonly known as: VENTOLIN HFA ?  ?cholestyramine 4 g packet ?Commonly known as: Questran ?  ?Cranberry 475 MG Caps ?  ?mometasone 0.1 % cream ?Commonly known as: ELOCON ?  ?nystatin cream ?Commonly known as:  MYCOSTATIN ?  ?omeprazole 20 MG capsule ?Commonly known as: PRILOSEC ?  ?sodium chloride HYPERTONIC 3 % nebulizer solution ?  ? ?  ? ?TAKE these medications   ? ?Accu-Chek Aviva Plus test strip ?Generic drug: glucose blood ?Use to test blood sugar four times daily. Dx: E11.9 ?  ?acetaminophen 325 MG tablet ?Commonly known as: TYLENOL ?Take 2 tablets (650 mg total) by mouth every 6 (six) hours as needed for mild pain (or Fever >/= 101). ?What changed: reasons to take this ?  ?ALPRAZolam 0.5 MG tablet ?Commonly known as: Xanax ?Take 0.5 tablets (0.25 mg total) by mouth at bedtime as needed for up to 30 doses for sleep. ?  ?aspirin EC 81 MG tablet ?Take 1 tablet (81 mg total) by mouth daily for 21 days. ?  ?cephALEXin 500 MG capsule ?Commonly known as: KEFLEX ?Take 1 capsule (500 mg total) by mouth 3 (three) times daily for 5 days. ?  ?clopidogrel 75 MG tablet ?Commonly known as: PLAVIX ?Take 1 tablet (75 mg  total) by mouth daily. ?Start taking on: August 14, 2021 ?  ?furosemide 20 MG tablet ?Commonly known as: LASIX ?Take 1 tablet (20 mg total) by mouth daily. ?  ?ipratropium-albuterol 0.5-2.5 (3) MG/3ML Soln ?Commonly known as: DUONEB ?1 vial in neb every 8 hours and as needed ?What changed:  ?how much to take ?how to take this ?when to take this ?reasons to take this ?additional instructions ?  ?irbesartan 75 MG tablet ?Commonly known as: AVAPRO ?Take 75 mg by mouth daily. ?  ?levalbuterol 45 MCG/ACT inhaler ?Commonly known as: XOPENEX HFA ?INHALE 1-2 PUFFS EVERY 6 HOURS IF NEEDED FOR WHEEZING. ?What changed: See the new instructions. ?  ?metoprolol succinate 50 MG 24 hr tablet ?Commonly known as: TOPROL-XL ?Take 50 mg by mouth 2 (two) times daily. ?  ?multivitamin with minerals Tabs tablet ?Take 1 tablet by mouth daily. ?Start taking on: August 14, 2021 ?  ?NovoLOG FlexPen 100 UNIT/ML FlexPen ?Generic drug: insulin aspart ?INJECT 12-14 UNITS UNDER THE SKIN AT BREAKFAST, 14-16 UNITS AT LUNCH, AND 12-16 UNITS AT  DINNER. ?What changed:  ?how much to take ?how to take this ?when to take this ?additional instructions ?  ?pantoprazole 40 MG tablet ?Commonly known as: Protonix ?Take 1 tablet (40 mg total) by mouth daily. ?  ?polyethylene gl

## 2021-08-13 NOTE — Consult Note (Signed)
[name] is a 86 y.o. female admitted medically for 08/09/2021 10:39 AM for a stroke. She carries the psychiatric diagnoses of anxiety and has a past medical history of DMII, arthritis, BPPV, COPD on home O2, esophageal stricture, HTN, glaucoma, HLD, IBS, pacemaker, spinal stenosis, NASH.Psychiatry was consulted for capacity to refuse dc to SNF by [facility].  ? ?On my evaluation, pt was alert, oriented and did well on formal attention testing. She was able to name the risks of going home AMA despite SNF recommendation. This is a stark contrast from earlier evaluation by primary team. Given the apparent waxing and waning nature of cognitive sx, the patient's presentation is most consistent with delirium, most likely due to multiple etiologies including but not limited to infection, medications, pain, altered sleep/wake cycle, and limited mobility.  ? ?The patient would strongly benefit from medical treatment of UTI, as well as further investigation for etiologies of delirium, including residual effects of stroke. During this time period, minimization of delirogenic insults will be of utmost importance; this includes promoting the normal circadian cycle, minimizing lines/tubes, avoiding deliriogenic medications such as benzodiazepines and anticholinergic medications, and frequently reorienting the patient. Symptomatic treatment for agitation can be provided by antipsychotic medications, though it is important to remember that these do not treat the underlying etiology of delirium. Notably, there can be a time lag effect between treatment of a medical problem and resolution of delirium. This time lag effect may be of longer duration in the elderly, and those with underlying cognitive impairment or brain injury. ? ? ?On followup examination 4/8 pt became more frustrated  with the assessment (which is reasonable given its purpose). She continued to be able to express her desire to go home and not to a SNF and list reasons why going home is higher risk and has had capacity to make this decision on serial assessments. Would consider having SW initiate APS report if there remains concern about pt's ability to manage medical issues outside the hospital.   OT recommendation has been updated to maximum [facility] support. This appears to be congruent with long-standing values - worked with PCP to get home health aide rather than going to SNF when memory impairment began and has had discussion about liberalizing blood sugar goals with outpt endocrinologist given overall limited life expectancy.  ? ? ? ?Active Hospital problems: ?Principal Problem: ?  Ischemic stroke ([name]) ?Active Problems: ?  COPD mixed type ([name]) ?  Hyperlipidemia ?  Hypertension ?  Acute metabolic encephalopathy ?  Type 2 diabetes mellitus ([name]) ?  Pacemaker ?  Mild cognitive impairment with memory loss ?  Acute ischemic stroke ([name]) ?  ? ? ?Plan  ?## Safety and Observation Level:  ?- Based on my clinical evaluation, I estimate the patient to be at low risk of self harm in the current setting ?- At this time, we recommend a routine level of observation. This decision is based on my review of the chart including patient's history and current presentation, interview of the patient, mental status examination, and consideration of suicide risk including evaluating suicidal ideation, plan, intent, suicidal or self-harm behaviors, risk factors, and protective factors. This judgment is based on our ability to directly address suicide risk, implement suicide prevention strategies and develop a safety plan while the patient is in the clinical setting. Please contact our team  if there is a concern that risk level has changed. ? ? ?## Medications:  ?-- None recommended ? ? ?## Medical Decision Making Capacity:  ?See initial consult  note ? ? ? ?## Further Work-up:  ?-- Per primary ? ? ? ?-- most recent EKG on 4/4 had QtC of 510 (although pulse 100, likely overcorrection) ? ?## Disposition:  ?-- Per primary team ? ?Thank you for this consult request. Recommendations have been communicated to the primary team.  We will continue to follow  at this time.  ? ?Joycelyn Schmid A Alaena Strader ? ? ?New history  ?Relevant Aspects of Hospital Course:  ?Admitted on 08/09/2021 for strokelike symptoms. Was confused and not making sense on initial presentation to hospital. Was found to have a small stroke and a UTI Notably scored a 10/30 on SLUMS by speech therapy on 4/5 at which time she appeared to be significantly more cognitively impaired (did not repeat SLUMS but orientation and attention improved on my exam). At that time she told SLP she would consider SNF but has more recently been declining as she feels she has made significant improvements from her initial stroke symptoms.  ? ?Updated OT recommendation for maximum HH with SNF if not able to achieve this.  ? ?Patient Report:  ?Saw pt in AM; she was having a pleasant conversation with her aide organizing affairs and making sure her cats were taken care of. Wrote down several numbers (brother, friend, and ex-husband) for her to call after friend located phonebook. Plans to call aide if she is discharged later today.  Wants to get out of here to settle some problems at home including feeding her cats. Apparently she is in some trouble at her apartment which bans cats. She remains aware that she is in overall poor health and likely to pass within the next few years whether she goes home or to a SNF (this is realistic given multiple medical comorbidities and age). Specifically she is aware that she is more likely to fall at her house and that her diabetes is difficult to control no matter the location (this is consistent with her last outpt endocrinology note in which Dr. Dwyane Dee recommended liberalized blood sugar  control). Has lost a lot of weight recently.  ? ?Repeated SLUMS; pt scored 7/30 with poor effort, often quit tasks early. Roughly equivalent to score of 10 3 days ago.  ?  ?ROS:  ?No SI. No HI. No AH/VH. ? ?Collateral information:  ?From chart review ? ?Psychiatric History:  ?Information collected from pt, chart review ?Pt with personal history of depression, anxiety ?No hx manic or psychotic sx - did have isolated hallucinations with recent heart attack.  ? ? ?Social History:  ?Lives alone ?Might have half brother in [place] ? ? ?Tobacco use: no ?Alcohol use: no ?Drug use: no ? ?Family History:  ?The patient's family history includes Diabetes in an other family member; Heart disease in her father; Liver cancer in her sister; Lung cancer in her mother and sister; Stroke in her maternal grandfather. ? ?Medical History: ?Past Medical History:  ?Diagnosis Date  ? Anxiety disorder, unspecified   ? Arthritis   ? Benign paroxysmal positional vertigo 08/09/2012  ? Chronic rhinitis   ? Cognitive communication deficit   ? Constipation, unspecified   ? COPD mixed type ([place])   ? on home O2  ? Diverticulosis   ? Esophageal stricture   ? Essential (primary) hypertension   ? Gastro-esophageal reflux disease without esophagitis   ?  Glaucoma   ? Hiatal hernia   ? Hyperlipidemia, unspecified   ? Irritable bowel syndrome   ? Presence of permanent cardiac pacemaker 01/14/2020  ? Spinal stenosis, lumbar region, without neurogenic claudication   ? Steatohepatitis   ? Thrombocytopenia, unspecified ([place])   ? Type 2 diabetes mellitus with diabetic polyneuropathy ([facility])   ? ? ?Surgical History: ?Past Surgical History:  ?Procedure Laterality Date  ? APPENDECTOMY    ? CARPAL TUNNEL RELEASE    ? right hand  ? CHOLECYSTECTOMY OPEN  1978  ? COLONOSCOPY  07-2001  ? mild diverticulosis  ? ESOPHAGOGASTRODUODENOSCOPY  5102,58-52  ? H Hernia,es.stricture s/p dil 19F  ? INTRAOCULAR LENS INSERTION Bilateral   ? LIVER BIOPSY  08-1992  ? PACEMAKER  IMPLANT N/A 01/14/2020  ? Procedure: PACEMAKER IMPLANT;  Surgeon: Evans Lance, MD;  Location: [facility];  Service: Cardiovascular;  Laterality: N/A;  ? PARATHYROID EXPLORATION    ? TONSILLECTOMY AND ADENOIDEC

## 2021-08-13 NOTE — Progress Notes (Signed)
Patient awaiting ride for discharge. Ride has been contacted and will come as soon as possible. Patient not appropriate for discharge lounge. Charge RN made aware. ?

## 2021-08-14 LAB — CULTURE, BLOOD (ROUTINE X 2): Culture: NO GROWTH

## 2021-08-15 ENCOUNTER — Telehealth: Payer: Self-pay | Admitting: Internal Medicine

## 2021-08-15 ENCOUNTER — Telehealth: Payer: Self-pay | Admitting: *Deleted

## 2021-08-15 LAB — CULTURE, BLOOD (ROUTINE X 2)
Culture: NO GROWTH
Special Requests: ADEQUATE

## 2021-08-15 NOTE — Telephone Encounter (Signed)
Transition Care Management Follow-up Telephone Call ?Date of discharge and from where: 08/13/2021 [facility] ?How have you been since you were released from the hospital? weak ?Any questions or concerns? No ? ?Items Reviewed: ?Did the pt receive and understand the discharge instructions provided? Yes  ?Medications obtained and verified? Yes  ?Other? No  ?Any new allergies since your discharge? No  ?Dietary orders reviewed? Yes ?Do you have support at home? Yes  ? ?Home Care and Equipment/Supplies: ?Were home health services ordered? yes ?If so, what is the name of the agency? [facility]  ?Has the agency set up a time to come to the patient's home? yes ?Were any new equipment or medical supplies ordered?  No ?What is the name of the medical supply agency? na ?Were you able to get the supplies/equipment? not applicable ?Do you have any questions related to the use of the equipment or supplies? No ? ?Functional Questionnaire: (I = Independent and D = Dependent) ?ADLs: I ? ?Bathing/Dressing- I ? ?Meal Prep- I ? ?Eating- I ? ?Maintaining continence- I ? ?Transferring/Ambulation- I with assistance ? ?Managing Meds- I ? ?Follow up appointments reviewed: ? ?PCP Hospital f/u appt confirmed? Yes  Scheduled to see Dr. Sabra Heck 08/24/2021 @11 .00 ?[facility] f/u appt confirmed? Yes  Patient is awaiting callback from Cardiologist ?Are transportation arrangements needed? No  ?If their condition worsens, is the pt aware to call PCP or go to the Emergency Dept.? Yes ?Was the patient provided with contact information for the PCP's office or ED? Yes ?Was to pt encouraged to call back with questions or concerns? Yes  ?

## 2021-08-15 NOTE — Telephone Encounter (Signed)
Patient was in the hospital and they told her that she needs to have her monitor check. Wanted to make sure its working properly and to make sure it hasnt moved. Please advise  ?

## 2021-08-16 NOTE — Telephone Encounter (Signed)
Called pt about appt with EP APP for hospital f/u. Offered appt on 5/1 with RU and also to be placed on waitlist if we get a cancellation sooner. Pt states she can not wait to be seen, she may be dead by then. She said do not worry about an appt if that's the only time she can be seen and hung up.  ?

## 2021-08-16 NOTE — Telephone Encounter (Signed)
Returned call to Pt. ? ?Scheduled Pt to see RU 09/05/2021. ? ?Advised she was taking Plavix and ASA to prevent another stroke. ? ?Pt was relieved. ? ? ?

## 2021-08-24 ENCOUNTER — Ambulatory Visit (INDEPENDENT_AMBULATORY_CARE_PROVIDER_SITE_OTHER): Payer: Medicare Other | Admitting: Family Medicine

## 2021-08-24 ENCOUNTER — Encounter: Payer: Self-pay | Admitting: Family Medicine

## 2021-08-24 VITALS — BP 148/92 | HR 110 | Temp 96.6°F | Ht 63.0 in | Wt 136.2 lb

## 2021-08-24 DIAGNOSIS — J9611 Chronic respiratory failure with hypoxia: Secondary | ICD-10-CM

## 2021-08-24 DIAGNOSIS — H8110 Benign paroxysmal vertigo, unspecified ear: Secondary | ICD-10-CM | POA: Diagnosis not present

## 2021-08-24 DIAGNOSIS — I10 Essential (primary) hypertension: Secondary | ICD-10-CM | POA: Diagnosis not present

## 2021-08-24 NOTE — Progress Notes (Signed)
? ? ?Provider:  ?Alain Honey, MD ? ?Careteam: ?Patient Care Team: ?Wardell Honour, MD as PCP - General (Family Medicine) ?Raynelle Bring, MD as Consulting Physician (Urology) ?Evans Lance, MD as Consulting Physician (Cardiology) ?Deneise Lever, MD as Consulting Physician (Pulmonary Disease) ?[employer] [employer] ?[name], MD as Consulting Physician (Endocrinology) ?Newt Minion, MD as Consulting Physician (Orthopedic Surgery) ?Raynelle Bring, MD as Consulting Physician (Urology) ? ?PLACE OF SERVICE:  ?[facility]  ?Advanced Directive information ?  ? ?Allergies  ?Allergen Reactions  ? Chlordiazepoxide-Clidinium Other (See Comments)  ?  Sleepy,weak  ? Biaxin [Clarithromycin] Other (See Comments)  ?  Foul taste, abd pain, diarrhea  ? Tramadol Other (See Comments)  ?  Caused tremors  ? Codeine Other (See Comments)  ?  Upset stomach  ? ? ?Chief Complaint  ?Patient presents with  ? Hospitalization Follow-up  ?  Patient presents today for a hospitalization follow-up. She was at [facility] on 08/09/21-08/13/21 for ischemic stroke.  ? ? ? ?HPI: Patient is a 86 y.o. female transition of care visit for this 86 year old female was hospitalized for 4 days with ischemic stroke.  Patient discharged back to home where she has a caregiver apparently several times a day.  Patient has COPD, type 2 diabetes poorly controlled, AV block with pacemaker.  She had been hospitalized previously this year for metabolic encephalopathy.  Symptoms this time are related to some dysarthria and difficulty finding words.  Symptoms have resolved. ?No new complaints today except for some incontinence. ?New medication from hospitalization was Plavix 75 mg/day. ?Review of Systems:  ?Review of Systems  ?Constitutional:  Positive for weight loss.  ?HENT: Negative.    ?Eyes: Negative.   ?Respiratory:  Positive for shortness of breath.   ?Cardiovascular: Negative.   ?Musculoskeletal: Negative.   ?Skin: Negative.    ?Neurological:  Positive for dizziness.  ?Psychiatric/Behavioral:  Positive for memory loss.   ?All other systems reviewed and are negative. ? ?Past Medical History:  ?Diagnosis Date  ? Anxiety disorder, unspecified   ? Arthritis   ? Benign paroxysmal positional vertigo 08/09/2012  ? Chronic rhinitis   ? Cognitive communication deficit   ? Constipation, unspecified   ? COPD mixed type ([place])   ? on home O2  ? Diverticulosis   ? Esophageal stricture   ? Essential (primary) hypertension   ? Gastro-esophageal reflux disease without esophagitis   ? Glaucoma   ? Hiatal hernia   ? Hyperlipidemia, unspecified   ? Irritable bowel syndrome   ? Presence of permanent cardiac pacemaker 01/14/2020  ? Spinal stenosis, lumbar region, without neurogenic claudication   ? Steatohepatitis   ? Thrombocytopenia, unspecified ([place])   ? Type 2 diabetes mellitus with diabetic polyneuropathy (HCC)   ? ?Past Surgical History:  ?Procedure Laterality Date  ? APPENDECTOMY    ? CARPAL TUNNEL RELEASE    ? right hand  ? CHOLECYSTECTOMY OPEN  1978  ? COLONOSCOPY  07-2001  ? mild diverticulosis  ? ESOPHAGOGASTRODUODENOSCOPY  2263,33-54  ? H Hernia,es.stricture s/p dil 31F  ? INTRAOCULAR LENS INSERTION Bilateral   ? LIVER BIOPSY  08-1992  ? PACEMAKER IMPLANT N/A 01/14/2020  ? Procedure: PACEMAKER IMPLANT;  Surgeon: Evans Lance, MD;  Location: [place] [facility];  Service: Cardiovascular;  Laterality: N/A;  ? PARATHYROID EXPLORATION    ? TONSILLECTOMY AND ADENOIDECTOMY    ? TOTAL ABDOMINAL HYSTERECTOMY    ? ULNAR NERVE TRANSPOSITION  12/07/2011  ? Procedure: ULNAR NERVE DECOMPRESSION/TRANSPOSITION;this  was cancelled-not done  Surgeon: Cammie Sickle., MD;  Location: [place];  Service: Orthopedics;  Laterality: Right;  right ulnar nerve in situ decompression  ? ULNAR TUNNEL RELEASE  03/07/2012  ? Procedure: CUBITAL TUNNEL RELEASE;  Surgeon: Roseanne Kaufman, MD;  Location: [place];  Service: Orthopedics;  Laterality:  Right;  ulnar nerve release at the elbow ? ?  ? ?Social History: ?  reports that she quit smoking about 33 years ago. Her smoking use included cigarettes. She has never used smokeless tobacco. She reports that she does not drink alcohol and does not use drugs. ? ?Family History  ?Problem Relation Age of Onset  ? Heart disease Father   ? Lung cancer Mother   ?     small cell;Byssinosis  ? Lung cancer Sister   ? Liver cancer Sister   ?     ? mets from another area of the body  ? Diabetes Other   ?     grandmother  ? Stroke Maternal Grandfather   ? ? ?Medications: ?Patient's Medications  ?New Prescriptions  ? No medications on file  ?Previous Medications  ? ACETAMINOPHEN (TYLENOL) 325 MG TABLET    Take 2 tablets (650 mg total) by mouth every 6 (six) hours as needed for mild pain (or Fever >/= 101).  ? ALPRAZOLAM (XANAX) 0.5 MG TABLET    Take 0.5 tablets (0.25 mg total) by mouth at bedtime as needed for up to 30 doses for sleep.  ? ASPIRIN EC 81 MG TABLET    Take 1 tablet (81 mg total) by mouth daily for 21 days.  ? CLOPIDOGREL (PLAVIX) 75 MG TABLET    Take 1 tablet (75 mg total) by mouth daily.  ? FUROSEMIDE (LASIX) 20 MG TABLET    Take 1 tablet (20 mg total) by mouth daily.  ? GLUCOSE BLOOD (ACCU-CHEK AVIVA PLUS) TEST STRIP    Use to test blood sugar four times daily. Dx: E11.9  ? INSULIN ASPART (NOVOLOG FLEXPEN) 100 UNIT/ML FLEXPEN    INJECT 12-14 UNITS UNDER THE SKIN AT BREAKFAST, 14-16 UNITS AT LUNCH, AND 12-16 UNITS AT DINNER.  ? INSULIN DEGLUDEC (TRESIBA FLEXTOUCH) 200 UNIT/ML FLEXTOUCH PEN    Inject 36 Units into the skin at bedtime.  ? IPRATROPIUM-ALBUTEROL (DUONEB) 0.5-2.5 (3) MG/3ML SOLN    1 vial in neb every 8 hours and as needed  ? IRBESARTAN (AVAPRO) 75 MG TABLET    Take 75 mg by mouth daily.  ? LEVALBUTEROL (XOPENEX HFA) 45 MCG/ACT INHALER    INHALE 1-2 PUFFS EVERY 6 HOURS IF NEEDED FOR WHEEZING.  ? METOPROLOL SUCCINATE (TOPROL-XL) 50 MG 24 HR TABLET    Take 50 mg by mouth 2 (two) times daily.  ?  MULTIPLE VITAMIN (MULTIVITAMIN WITH MINERALS) TABS TABLET    Take 1 tablet by mouth daily.  ? PANTOPRAZOLE (PROTONIX) 40 MG TABLET    Take 1 tablet (40 mg total) by mouth daily.  ? POLYETHYLENE GLYCOL POWDER (GLYCOLAX/MIRALAX) 17 GM/SCOOP POWDER    Take 17 g by mouth daily.  ? PRAVASTATIN (PRAVACHOL) 20 MG TABLET    Take 1 tablet (20 mg total) by mouth daily.  ? THIAMINE 100 MG TABLET    Take 1 tablet (100 mg total) by mouth daily.  ? TIOTROPIUM BROMIDE-OLODATEROL (STIOLTO RESPIMAT) 2.5-2.5 MCG/ACT AERS    Inhale 2 puffs into the lungs daily.  ?Modified Medications  ? No medications on file  ?Discontinued Medications  ? No medications on  file  ? ? ?Physical Exam: ? ?Vitals:  ? 08/24/21 1107  ?Height: 5' 3"  (1.6 m)  ? ?Body mass index is 25.15 kg/m?. ?Wt Readings from Last 3 Encounters:  ?08/09/21 141 lb 15.6 oz (64.4 kg)  ?06/16/21 150 lb 3.2 oz (68.1 kg)  ?05/31/21 155 lb 12.8 oz (70.7 kg)  ? ? ?Physical Exam ?Vitals and nursing note reviewed.  ?Constitutional:   ?   Appearance: Normal appearance.  ?HENT:  ?   Head: Normocephalic.  ?Cardiovascular:  ?   Rate and Rhythm: Normal rate and regular rhythm.  ?Pulmonary:  ?   Effort: Pulmonary effort is normal.  ?   Breath sounds: Normal breath sounds. No wheezing.  ?Abdominal:  ?   General: Abdomen is flat. Bowel sounds are normal.  ?   Tenderness: There is left CVA tenderness.  ?Neurological:  ?   General: No focal deficit present.  ?   Mental Status: She is alert and oriented to person, place, and time.  ?Psychiatric:     ?   Mood and Affect: Mood normal.     ?   Behavior: Behavior normal.  ? ? ?Labs reviewed: ?Basic Metabolic Panel: ?Recent Labs  ?  05/31/21 ?1119 08/09/21 ?1044 08/09/21 ?1050 08/09/21 ?1156 08/10/21 ?0438 08/13/21 ?0209  ?NA 136 135 135 135  --  136  ?K 4.6 4.5 4.3 4.3  --  4.2  ?CL 94* 96* 94*  --   --  96*  ?CO2 39* 29  --   --   --  34*  ?GLUCOSE 326* 311* 317*  --   --  166*  ?BUN 12 9 10   --   --  10  ?CREATININE 0.75 0.63 0.50  --   --  0.54   ?CALCIUM 9.9 9.4  --   --   --  9.7  ?TSH  --   --   --   --  1.621  --   ? ?Liver Function Tests: ?Recent Labs  ?  05/31/21 ?1119 08/09/21 ?1044  ?AST 16 29  ?ALT 16 18  ?ALKPHOS  --  50  ?BILITOT 0.6 0.8  ?PROT 7.3 7

## 2021-08-30 NOTE — Progress Notes (Deleted)
Cardiology Office Note Date:  08/30/2021  Patient ID:  [name], [name] 09/29/1935, MRN [id number] PCP:  Wardell Honour, MD  Electrophysiologist: Dr. Lovena Le  ***refresh   Chief Complaint: *** post hospital  History of Present Illness: [name] is a 86 y.o. female with history of HTN, HLD, DM, CHB w/PPM, COPD (home O2), dementia, NASH  She comes in today to be seen for Dr. Lovena Le, last seen by him Dec 2022, she was doing well, pacer functioning normally, BP high and advised to reduce her sodium.  She was admitted 08/09/21 - 08/13/21 with difficult speech and some c/o CP, found with stroke, also treated for possible UTI, metabolic encephalopathy superimposed on some degree of baseline dementia Recs were for SNF though pt declined, managed with IM, neurology and psych services  Seems there was discussion of getting her pacer evaluated, but no noted findings  *** any AFib? *** symptoms  Device information Abbott dual chamber PPM implanted 01/14/2020   Past Medical History:  Diagnosis Date   Anxiety disorder, unspecified    Arthritis    Benign paroxysmal positional vertigo 08/09/2012   Chronic rhinitis    Cognitive communication deficit    Constipation, unspecified    COPD mixed type ([place])    on home O2   Diverticulosis    Esophageal stricture    Essential (primary) hypertension    Gastro-esophageal reflux disease without esophagitis    Glaucoma    Hiatal hernia    Hyperlipidemia, unspecified    Irritable bowel syndrome    Presence of permanent cardiac pacemaker 01/14/2020   Spinal stenosis, lumbar region, without neurogenic claudication    Steatohepatitis    Thrombocytopenia, unspecified ([place])    Type 2 diabetes mellitus with diabetic polyneuropathy ([place])     Past Surgical History:  Procedure Laterality Date   APPENDECTOMY     CARPAL TUNNEL RELEASE     right hand   CHOLECYSTECTOMY OPEN  1978   COLONOSCOPY  07-2001   mild diverticulosis    ESOPHAGOGASTRODUODENOSCOPY  9030,09-23   H Hernia,es.stricture s/p dil 22F   INTRAOCULAR LENS INSERTION Bilateral    LIVER BIOPSY  07-74   PACEMAKER IMPLANT N/A 01/14/2020   Procedure: PACEMAKER IMPLANT;  Surgeon: Evans Lance, MD;  Location: [place] [facility];  Service: Cardiovascular;  Laterality: N/A;   PARATHYROID EXPLORATION     TONSILLECTOMY AND ADENOIDECTOMY     TOTAL ABDOMINAL HYSTERECTOMY     ULNAR NERVE TRANSPOSITION  12/07/2011   Procedure: ULNAR NERVE DECOMPRESSION/TRANSPOSITION;this was cancelled-not done  Surgeon: Cammie Sickle., MD;  Location: [place];  Service: Orthopedics;  Laterality: Right;  right ulnar nerve in situ decompression   ULNAR TUNNEL RELEASE  03/07/2012   Procedure: CUBITAL TUNNEL RELEASE;  Surgeon: Roseanne Kaufman, MD;  Location: [place];  Service: Orthopedics;  Laterality: Right;  ulnar nerve release at the elbow      Current Outpatient Medications  Medication Sig Dispense Refill   acetaminophen (TYLENOL) 325 MG tablet Take 2 tablets (650 mg total) by mouth every 6 (six) hours as needed for mild pain (or Fever >/= 101). (Patient taking differently: Take 650 mg by mouth every 6 (six) hours as needed for mild pain.)     ALPRAZolam (XANAX) 0.5 MG tablet Take 0.5 tablets (0.25 mg total) by mouth at bedtime as needed for up to 30 doses for sleep. 15 tablet 0   aspirin EC 81 MG tablet Take 1 tablet (81 mg  total) by mouth daily for 21 days.     clopidogrel (PLAVIX) 75 MG tablet Take 1 tablet (75 mg total) by mouth daily. 30 tablet 2   furosemide (LASIX) 20 MG tablet Take 1 tablet (20 mg total) by mouth daily. 30 tablet 11   glucose blood (ACCU-CHEK AVIVA PLUS) test strip Use to test blood sugar four times daily. Dx: E11.9 400 each 1   insulin aspart (NOVOLOG FLEXPEN) 100 UNIT/ML FlexPen INJECT 12-14 UNITS UNDER THE SKIN AT BREAKFAST, 14-16 UNITS AT LUNCH, AND 12-16 UNITS AT DINNER. (Patient taking differently: Inject 12 Units  into the skin 3 (three) times daily.) 15 mL 3   insulin degludec (TRESIBA FLEXTOUCH) 200 UNIT/ML FlexTouch Pen Inject 36 Units into the skin at bedtime. 15 mL 3   ipratropium-albuterol (DUONEB) 0.5-2.5 (3) MG/3ML SOLN 1 vial in neb every 8 hours and as needed (Patient taking differently: Inhale 3 mLs into the lungs every 8 (eight) hours as needed (wheezing/shortness of breath).) 90 mL 0   irbesartan (AVAPRO) 75 MG tablet Take 75 mg by mouth daily.     levalbuterol (XOPENEX HFA) 45 MCG/ACT inhaler INHALE 1-2 PUFFS EVERY 6 HOURS IF NEEDED FOR WHEEZING. (Patient taking differently: Inhale 1-2 puffs into the lungs every 6 (six) hours as needed for wheezing.) 15 g 12   metoprolol succinate (TOPROL-XL) 50 MG 24 hr tablet Take 50 mg by mouth 2 (two) times daily.     Multiple Vitamin (MULTIVITAMIN WITH MINERALS) TABS tablet Take 1 tablet by mouth daily. 100 tablet 0   pantoprazole (PROTONIX) 40 MG tablet Take 1 tablet (40 mg total) by mouth daily. 30 tablet 1   polyethylene glycol powder (GLYCOLAX/MIRALAX) 17 GM/SCOOP powder Take 17 g by mouth daily.     pravastatin (PRAVACHOL) 20 MG tablet Take 1 tablet (20 mg total) by mouth daily. 90 tablet 1   thiamine 100 MG tablet Take 1 tablet (100 mg total) by mouth daily. 100 tablet    Tiotropium Bromide-Olodaterol (STIOLTO RESPIMAT) 2.5-2.5 MCG/ACT AERS Inhale 2 puffs into the lungs daily. 4 g 11   No current facility-administered medications for this visit.    Allergies:   Chlordiazepoxide-clidinium, Biaxin [clarithromycin], Tramadol, and Codeine   Social History:  The patient  reports that she quit smoking about 33 years ago. Her smoking use included cigarettes. She has never used smokeless tobacco. She reports that she does not drink alcohol and does not use drugs.   Family History:  The patient's family history includes Diabetes in an other family member; Heart disease in her father; Liver cancer in her sister; Lung cancer in her mother and sister; Stroke  in her maternal grandfather.  ROS:  Please see the history of present illness.    All other systems are reviewed and otherwise negative.   PHYSICAL EXAM:  VS:  There were no vitals taken for this visit. BMI: There is no height or weight on file to calculate BMI. Well nourished, well developed, in no acute distress HEENT: normocephalic, atraumatic Neck: no JVD, carotid bruits or masses Cardiac:  *** RRR; no significant murmurs, no rubs, or gallops Lungs:  *** CTA b/l, no wheezing, rhonchi or rales Abd: soft, nontender MS: no deformity or *** atrophy Ext: *** no edema Skin: warm and dry, no rash Neuro:  No gross deficits appreciated Psych: euthymic mood, full affect  *** PPM site is stable, no tethering or discomfort   EKG:  not done today  Device interrogation done today and reviewed by myself:  ***  08/09/2021: TTE 1. Left ventricular ejection fraction, by estimation, is 55 to 60%. The  left ventricle has normal function. The left ventricle has no regional  wall motion abnormalities. Indeterminate diastolic filling due to E-A  fusion.   2. Right ventricular systolic function is normal. The right ventricular  size is normal. There is normal pulmonary artery systolic pressure. The  estimated right ventricular systolic pressure is 48.0 mmHg.   3. The pericardial effusion is circumferential.   4. The mitral valve is normal in structure. No evidence of mitral valve  regurgitation. No evidence of mitral stenosis.   5. The aortic valve is tricuspid. Aortic valve regurgitation is not  visualized. Aortic valve sclerosis is present, with no evidence of aortic  valve stenosis.   6. The inferior vena cava is normal in size with greater than 50%  respiratory variability, suggesting right atrial pressure of 3 mmHg.   Recent Labs: 08/09/2021: ALT 18 08/10/2021: TSH 1.621 08/13/2021: BUN 10; Creatinine, Ser 0.54; Hemoglobin 14.8; Platelets 142; Potassium 4.2; Sodium 136  08/09/2021:  Cholesterol 90; HDL 38; LDL Cholesterol 42; Total CHOL/HDL Ratio 2.4; Triglycerides 50; VLDL 10   Estimated Creatinine Clearance: 43.3 mL/min (by C-G formula based on SCr of 0.54 mg/dL).   Wt Readings from Last 3 Encounters:  08/24/21 136 lb 3.2 oz (61.8 kg)  08/09/21 141 lb 15.6 oz (64.4 kg)  06/16/21 150 lb 3.2 oz (68.1 kg)     Other studies reviewed: Additional studies/records reviewed today include: summarized above  ASSESSMENT AND PLAN:  PPM ***  HTN ***  3. Recent cryptogenic stroke ***  Disposition: F/u with ***  Current medicines are reviewed at length with the patient today.  The patient did not have any concerns regarding medicines.  Venetia Night, PA-C 08/30/2021 11:58 AM     [facility] [place] [address] [phone] (office)  [phone] (fax)

## 2021-09-02 ENCOUNTER — Telehealth: Payer: Self-pay

## 2021-09-02 MED ORDER — MIRABEGRON ER 50 MG PO TB24: 50.0000 mg | ORAL_TABLET | Freq: Every day | ORAL | 1 refills | Status: DC

## 2021-09-02 NOTE — Telephone Encounter (Signed)
Patient called and states that when she was last seen in office she was having issues with wetting herself at night in the bed. Patient states that Dr.Miller gave her samples of Myrbetriq 1m ER. She was told by him that if the pills worked for her then prescription would be sent in. Medication pend and sent to JSherrie Mustache NP for approval.  ?

## 2021-09-02 NOTE — Telephone Encounter (Signed)
Thank you. Patient called and notified.  ?

## 2021-09-02 NOTE — Telephone Encounter (Signed)
Order sent to pharmacy  

## 2021-09-05 ENCOUNTER — Encounter: Payer: Medicare Other | Admitting: Physician Assistant

## 2021-09-06 ENCOUNTER — Telehealth: Payer: Self-pay | Admitting: *Deleted

## 2021-09-06 NOTE — Telephone Encounter (Signed)
Patient called and stated that she has NOT been taking her Insulin since her stroke. Stated that she forgot about giving it to herself.  ?Stated that she cannot remember how many units she is suppose to be taking. Wants to know how many units you want her to be taking.  ? ?Also wants to know if this could be the cause of her sleeping a lot. ? ?Please Advise.  ?

## 2021-09-06 NOTE — Telephone Encounter (Signed)
Wardell Honour, MD  You 6 minutes ago (12:18 PM)  ? ?GLUCOSE BLOOD (ACCU-CHEK AVIVA PLUS) TEST STRIP   Use to test blood sugar four times daily. Dx: E11.9  ?  INSULIN ASPART (NOVOLOG FLEXPEN) 100 UNIT/ML FLEXPEN   INJECT 12-14 UNITS UNDER THE SKIN AT BREAKFAST, 14-16 UNITS AT LUNCH, AND 12-16 UNITS AT DINNER.  ?  INSULIN DEGLUDEC (TRESIBA FLEXTOUCH) 200 UNIT/ML    ? ? ?insulin degludec (TRESIBA FLEXTOUCH) 200 UNIT/ML FlexTouch Pen 36 Units, Daily at bedtime Inject 36 Units into the skin at bedtime.  ? ?insulin aspart (NOVOLOG FLEXPEN) 100 UNIT/ML FlexPen  INJECT 12-14 UNITS UNDER THE SKIN AT BREAKFAST, 14-16 UNITS AT LUNCH, AND 12-16 UNITS AT DINNER.  ? ? ?Patient notified and agreed. Aid helped write instructions down.  ?

## 2021-09-07 NOTE — Progress Notes (Signed)
? ? ?Electrophysiology Office Note ?Date: 09/07/2021 ? ?ID:  [name], DOB Aug 09, 1935, MRN [id number] ? ?PCP: Wardell Honour, MD ?Primary Cardiologist: None ?Electrophysiologist: Cristopher Peru, MD  ? ?CC: Pacemaker follow-up ? ?[name] is a 86 y.o. female seen today for Cristopher Peru, MD for post hospital follow up.   ? ?Admitted 4/4 - 4/8 for ischemic stroke. CT head without acute findings.  CTA neck showed no acute large vessel occlusion but 30% stenosis of the proximal ICA on the right and 60% stenosis on distal bulb on the left. MRI of the brain was then performed later due to pacemaker in place which showed a small restricted diffusion in the left temporoparietal area compatible with acute stroke. No AF on PPM. Discharged on ASA + Plavix x 3 weeks, then Plavix alone.  ? ?Since discharge from hospital the patient reports doing well. She is a very circumferential historian. Currently, she denies chest pain, palpitations, dyspnea, PND, orthopnea, nausea, vomiting, dizziness, syncope, edema, weight gain, or early satiety. ? ?Device History: ?[employer] Dual Chamber PPM implanted 01/2020 for [facility]B ? ?Past Medical History:  ?Diagnosis Date  ? Anxiety disorder, unspecified   ? Arthritis   ? Benign paroxysmal positional vertigo 08/09/2012  ? Chronic rhinitis   ? Cognitive communication deficit   ? Constipation, unspecified   ? COPD mixed type ([place])   ? on home O2  ? Diverticulosis   ? Esophageal stricture   ? Essential (primary) hypertension   ? Gastro-esophageal reflux disease without esophagitis   ? Glaucoma   ? Hiatal hernia   ? Hyperlipidemia, unspecified   ? Irritable bowel syndrome   ? Presence of permanent cardiac pacemaker 01/14/2020  ? Spinal stenosis, lumbar region, without neurogenic claudication   ? Steatohepatitis   ? Thrombocytopenia, unspecified ([place])   ? Type 2 diabetes mellitus with diabetic polyneuropathy ([facility])   ? ?Past Surgical History:  ?Procedure Laterality Date  ? APPENDECTOMY    ?  CARPAL TUNNEL RELEASE    ? right hand  ? CHOLECYSTECTOMY OPEN  1978  ? COLONOSCOPY  07-2001  ? mild diverticulosis  ? ESOPHAGOGASTRODUODENOSCOPY  0017,49-44  ? H Hernia,es.stricture s/p dil 12F  ? INTRAOCULAR LENS INSERTION Bilateral   ? LIVER BIOPSY  08-1992  ? PACEMAKER IMPLANT N/A 01/14/2020  ? Procedure: PACEMAKER IMPLANT;  Surgeon: Evans Lance, MD;  Location: [facility];  Service: Cardiovascular;  Laterality: N/A;  ? PARATHYROID EXPLORATION    ? TONSILLECTOMY AND ADENOIDECTOMY    ? TOTAL ABDOMINAL HYSTERECTOMY    ? ULNAR NERVE TRANSPOSITION  12/07/2011  ? Procedure: ULNAR NERVE DECOMPRESSION/TRANSPOSITION;this was cancelled-not done  Surgeon: Cammie Sickle., MD;  Location: [facility];  Service: Orthopedics;  Laterality: Right;  right ulnar nerve in situ decompression  ? ULNAR TUNNEL RELEASE  03/07/2012  ? Procedure: CUBITAL TUNNEL RELEASE;  Surgeon: Roseanne Kaufman, MD;  Location: [facility];  Service: Orthopedics;  Laterality: Right;  ulnar nerve release at the elbow ? ?  ? ? ?Current Outpatient Medications  ?Medication Sig Dispense Refill  ? acetaminophen (TYLENOL) 325 MG tablet Take 2 tablets (650 mg total) by mouth every 6 (six) hours as needed for mild pain (or Fever >/= 101). (Patient taking differently: Take 650 mg by mouth every 6 (six) hours as needed for mild pain.)    ? ALPRAZolam (XANAX) 0.5 MG tablet Take 0.5 tablets (0.25 mg total) by mouth at bedtime as needed for up to 30  doses for sleep. 15 tablet 0  ? clopidogrel (PLAVIX) 75 MG tablet Take 1 tablet (75 mg total) by mouth daily. 30 tablet 2  ? furosemide (LASIX) 20 MG tablet Take 1 tablet (20 mg total) by mouth daily. 30 tablet 11  ? glucose blood (ACCU-CHEK AVIVA PLUS) test strip Use to test blood sugar four times daily. Dx: E11.9 400 each 1  ? insulin aspart (NOVOLOG FLEXPEN) 100 UNIT/ML FlexPen INJECT 12-14 UNITS UNDER THE SKIN AT BREAKFAST, 14-16 UNITS AT LUNCH, AND 12-16 UNITS AT DINNER. (Patient  taking differently: Inject 12 Units into the skin 3 (three) times daily.) 15 mL 3  ? insulin degludec (TRESIBA FLEXTOUCH) 200 UNIT/ML FlexTouch Pen Inject 36 Units into the skin at bedtime. 15 mL 3  ? ipratropium-albuterol (DUONEB) 0.5-2.5 (3) MG/3ML SOLN 1 vial in neb every 8 hours and as needed (Patient taking differently: Inhale 3 mLs into the lungs every 8 (eight) hours as needed (wheezing/shortness of breath).) 90 mL 0  ? irbesartan (AVAPRO) 75 MG tablet Take 75 mg by mouth daily.    ? levalbuterol (XOPENEX HFA) 45 MCG/ACT inhaler INHALE 1-2 PUFFS EVERY 6 HOURS IF NEEDED FOR WHEEZING. (Patient taking differently: Inhale 1-2 puffs into the lungs every 6 (six) hours as needed for wheezing.) 15 g 12  ? metoprolol succinate (TOPROL-XL) 50 MG 24 hr tablet Take 50 mg by mouth 2 (two) times daily.    ? mirabegron ER (MYRBETRIQ) 50 MG TB24 tablet Take 1 tablet (50 mg total) by mouth daily. 30 tablet 1  ? Multiple Vitamin (MULTIVITAMIN WITH MINERALS) TABS tablet Take 1 tablet by mouth daily. 100 tablet 0  ? pantoprazole (PROTONIX) 40 MG tablet Take 1 tablet (40 mg total) by mouth daily. 30 tablet 1  ? polyethylene glycol powder (GLYCOLAX/MIRALAX) 17 GM/SCOOP powder Take 17 g by mouth daily.    ? pravastatin (PRAVACHOL) 20 MG tablet Take 1 tablet (20 mg total) by mouth daily. 90 tablet 1  ? thiamine 100 MG tablet Take 1 tablet (100 mg total) by mouth daily. 100 tablet   ? Tiotropium Bromide-Olodaterol (STIOLTO RESPIMAT) 2.5-2.5 MCG/ACT AERS Inhale 2 puffs into the lungs daily. 4 g 11  ? ?No current facility-administered medications for this visit.  ? ? ?Allergies:   Chlordiazepoxide-clidinium, Biaxin [clarithromycin], Tramadol, and Codeine  ? ?Social History: ?Social History  ? ?Socioeconomic History  ? Marital status: Single  ?  Spouse name: John,divorced, deceased  ? Number of children: 0  ? Years of education: high school  ? Highest education level: High school graduate  ?Occupational History  ? Occupation: Retired    ?  Comment: artist  ?Tobacco Use  ? Smoking status: Former  ?  Types: Cigarettes  ?  Quit date: 05/08/1988  ?  Years since quitting: 33.3  ? Smokeless tobacco: Never  ?Vaping Use  ? Vaping Use: Never used  ?Substance and Sexual Activity  ? Alcohol use: No  ?  Alcohol/week: 0.0 standard drinks  ? Drug use: No  ? Sexual activity: Not Currently  ?Other Topics Concern  ? Not on file  ?Social History Narrative  ? Not on file  ? ?Social Determinants of Health  ? ?Financial Resource Strain: Not on file  ?Food Insecurity: Not on file  ?Transportation Needs: Not on file  ?Physical Activity: Not on file  ?Stress: Not on file  ?Social Connections: Not on file  ?Intimate Partner Violence: Not on file  ? ? ?Family History: ?Family History  ?Problem Relation Age of  Onset  ? Heart disease Father   ? Lung cancer Mother   ?     small cell;Byssinosis  ? Lung cancer Sister   ? Liver cancer Sister   ?     ? mets from another area of the body  ? Diabetes Other   ?     grandmother  ? Stroke Maternal Grandfather   ? ? ? ?Review of Systems: ?All other systems reviewed and are otherwise negative except as noted above. ? ?Physical Exam: ?There were no vitals filed for this visit.  ? ?GEN- The patient is well appearing, alert and oriented x 3 today.   ?HEENT: normocephalic, atraumatic; sclera clear, conjunctiva pink; hearing intact; oropharynx clear; neck supple  ?Lungs- Clear to ausculation bilaterally, normal work of breathing.  No wheezes, rales, rhonchi ?Heart- Regular rate and rhythm, no murmurs, rubs or gallops  ?GI- soft, non-tender, non-distended, bowel sounds present  ?Extremities- no clubbing or cyanosis. No edema ?MS- no significant deformity or atrophy ?Skin- warm and dry, no rash or lesion; PPM pocket well healed ?Psych- euthymic mood, full affect ?Neuro- strength and sensation are intact ? ?PPM Interrogation- reviewed in detail today,  See PACEART report ? ?EKG:  EKG is not ordered today. ? ?Recent Labs: ?08/09/2021: ALT  18 ?08/10/2021: TSH 1.621 ?08/13/2021: BUN 10; Creatinine, Ser 0.54; Hemoglobin 14.8; Platelets 142; Potassium 4.2; Sodium 136  ? ?Wt Readings from Last 3 Encounters:  ?08/24/21 136 lb 3.2 oz (61.8 kg)  ?08/09/21 141 lb 15

## 2021-09-14 ENCOUNTER — Encounter: Payer: Self-pay | Admitting: Student

## 2021-09-14 ENCOUNTER — Ambulatory Visit (INDEPENDENT_AMBULATORY_CARE_PROVIDER_SITE_OTHER): Payer: Medicare Other | Admitting: Student

## 2021-09-14 VITALS — BP 128/70 | HR 90 | Ht 63.0 in | Wt 135.6 lb

## 2021-09-14 DIAGNOSIS — I10 Essential (primary) hypertension: Secondary | ICD-10-CM | POA: Diagnosis not present

## 2021-09-14 DIAGNOSIS — I442 Atrioventricular block, complete: Secondary | ICD-10-CM

## 2021-09-14 DIAGNOSIS — I639 Cerebral infarction, unspecified: Secondary | ICD-10-CM | POA: Diagnosis not present

## 2021-09-14 LAB — CUP PACEART INCLINIC DEVICE CHECK
Battery Remaining Longevity: 103 mo
Battery Voltage: 3.01 V
Brady Statistic RA Percent Paced: 1.1 %
Brady Statistic RV Percent Paced: 99.87 %
Date Time Interrogation Session: 20230510125353
Implantable Lead Implant Date: 20210908
Implantable Lead Implant Date: 20210908
Implantable Lead Location: 753859
Implantable Lead Location: 753860
Implantable Pulse Generator Implant Date: 20210908
Lead Channel Impedance Value: 575 Ohm
Lead Channel Impedance Value: 575 Ohm
Lead Channel Pacing Threshold Amplitude: 0.625 V
Lead Channel Pacing Threshold Amplitude: 0.75 V
Lead Channel Pacing Threshold Amplitude: 0.75 V
Lead Channel Pacing Threshold Pulse Width: 0.4 ms
Lead Channel Pacing Threshold Pulse Width: 0.4 ms
Lead Channel Pacing Threshold Pulse Width: 0.5 ms
Lead Channel Sensing Intrinsic Amplitude: 2.1 mV
Lead Channel Sensing Intrinsic Amplitude: 9.5 mV
Lead Channel Setting Pacing Amplitude: 0.875
Lead Channel Setting Pacing Amplitude: 2 V
Lead Channel Setting Pacing Pulse Width: 0.5 ms
Lead Channel Setting Sensing Sensitivity: 2 mV
Pulse Gen Model: 2272
Pulse Gen Serial Number: 3856705

## 2021-09-14 MED ORDER — CLOPIDOGREL BISULFATE 75 MG PO TABS: 75.0000 mg | ORAL_TABLET | Freq: Every day | ORAL | 3 refills | Status: DC

## 2021-09-14 NOTE — Patient Instructions (Signed)
Medication Instructions:  ?Your physician recommends that you continue on your current medications as directed. Please refer to the Current Medication list given to you today. ? ?*If you need a refill on your cardiac medications before your next appointment, please call your pharmacy* ? ? ?Lab Work: ?None ?If you have labs (blood work) drawn today and your tests are completely normal, you will receive your results only by: ?MyChart Message (if you have MyChart) OR ?A paper copy in the mail ?If you have any lab test that is abnormal or we need to change your treatment, we will call you to review the results. ? ? ?Follow-Up: ?At [facility], you and your health needs are our priority.  As part of our continuing mission to provide you with exceptional heart care, we have created designated Provider Care Teams.  These Care Teams include your primary Cardiologist (physician) and Advanced Practice Providers (APPs -  Physician Assistants and Nurse Practitioners) who all work together to provide you with the care you need, when you need it. ? ?We recommend signing up for the patient portal called "MyChart".  Sign up information is provided on this After Visit Summary.  MyChart is used to connect with patients for Virtual Visits (Telemedicine).  Patients are able to view lab/test results, encounter notes, upcoming appointments, etc.  Non-urgent messages can be sent to your provider as well.   ?To learn more about what you can do with MyChart, go to [email or web address].   ? ?Your next appointment:   ?6 month(s) ? ?The format for your next appointment:   ?In Person ? ?Provider:   ?Cristopher Peru, MD{ ?  ?

## 2021-09-16 ENCOUNTER — Other Ambulatory Visit: Payer: Self-pay | Admitting: *Deleted

## 2021-09-16 MED ORDER — MIRABEGRON ER 50 MG PO TB24: 50.0000 mg | ORAL_TABLET | Freq: Every day | ORAL | 1 refills | Status: DC

## 2021-09-16 NOTE — Telephone Encounter (Signed)
[facility] requested refill.  ?

## 2021-09-22 ENCOUNTER — Telehealth: Payer: Self-pay

## 2021-09-22 NOTE — Telephone Encounter (Signed)
Called and discussed with the patient. Agency hasn't heard from her aide. She will call back when she knows she will have a ride.

## 2021-09-22 NOTE — Telephone Encounter (Signed)
Will really need you to come to the office for evaluation.

## 2021-09-22 NOTE — Telephone Encounter (Signed)
Feet swelling again after 2 years. Pill prescribed in the past helped. Right is worse than left. Both have a sore. She think its her shoes that were rubbing when they were swollen. She is also losing weight that she is concerned about. For the last 2-3 days. Patient states she is seeing a cardiologist and had pacemaker placed recently. Patient unable to come in today, her aide is MIA. She doesn't drive. She declined trying to work on a ride.

## 2021-09-23 ENCOUNTER — Ambulatory Visit (INDEPENDENT_AMBULATORY_CARE_PROVIDER_SITE_OTHER): Payer: Medicare Other | Admitting: Family

## 2021-09-23 ENCOUNTER — Encounter: Payer: Self-pay | Admitting: Family

## 2021-09-23 VITALS — BP 140/80 | HR 118 | Temp 97.1°F | Ht 63.0 in | Wt 134.4 lb

## 2021-09-23 DIAGNOSIS — S91351A Open bite, right foot, initial encounter: Secondary | ICD-10-CM

## 2021-09-23 DIAGNOSIS — I639 Cerebral infarction, unspecified: Secondary | ICD-10-CM | POA: Diagnosis not present

## 2021-09-23 DIAGNOSIS — W5501XA Bitten by cat, initial encounter: Secondary | ICD-10-CM | POA: Diagnosis not present

## 2021-09-23 DIAGNOSIS — L602 Onychogryphosis: Secondary | ICD-10-CM | POA: Diagnosis not present

## 2021-09-23 DIAGNOSIS — E1165 Type 2 diabetes mellitus with hyperglycemia: Secondary | ICD-10-CM

## 2021-09-23 DIAGNOSIS — L03115 Cellulitis of right lower limb: Secondary | ICD-10-CM

## 2021-09-23 MED ORDER — SACCHAROMYCES BOULARDII 250 MG PO CAPS: 250.0000 mg | ORAL_CAPSULE | Freq: Two times a day (BID) | ORAL | 0 refills | Status: DC

## 2021-09-23 MED ORDER — DOXYCYCLINE HYCLATE 100 MG PO TABS: 100.0000 mg | ORAL_TABLET | Freq: Two times a day (BID) | ORAL | 0 refills | Status: DC

## 2021-09-23 NOTE — Patient Instructions (Addendum)
cleanse with saline ,pat dry, triple antibiotic ointment applied and covered with foam dressing for extra protection and absorption or band aid .change dressing daily.  - Notify provider for any worsening symptoms of infection ( redness,odor,swelling or drainage) or fail to heal  - Please get your tetanus vaccine at your pharmacy

## 2021-09-23 NOTE — Progress Notes (Signed)
Provider: Marylene Masek FNP-C  Wardell Honour, MD  Patient Care Team: Wardell Honour, MD as PCP - General (Family Medicine) Evans Lance, MD as PCP - Electrophysiology (Cardiology) Raynelle Bring, MD as Consulting Physician (Urology) Evans Lance, MD as Consulting Physician (Cardiology) Deneise Lever, MD as Consulting Physician (Pulmonary Disease) [facility] [name], MD as Consulting Physician (Endocrinology) Newt Minion, MD as Consulting Physician (Orthopedic Surgery) Raynelle Bring, MD as Consulting Physician (Urology)  No emergency contact information on file.  Code Status:  Full Code  Goals of care: Advanced Directive information    06/16/2021   10:58 AM  Advanced Directives  Does Patient Have a Medical Advance Directive? No  Would patient like information on creating a medical advance directive? No - Patient declined     Chief Complaint  Patient presents with   Acute Visit    Patient complains of swollen feet and sores on feet.Patient has had swelling for about 2 weeks.Patient has sores on feet. Patient thinks it may be coming from shoes rubbing. The right is worse than the left.Patient has red area on right foot     HPI:  Pt is a 86 y.o. female seen today for an acute visit for evaluation of right leg swelling and wound for about 2 weeks. Thinks her cat might have nibbled on her foot.  States has a big cat that usually nibble on her feet whenever she is asleep She denies any fever,chills or drainage.   Past Medical History:  Diagnosis Date   Anxiety disorder, unspecified    Arthritis    Benign paroxysmal positional vertigo 08/09/2012   Chronic rhinitis    Cognitive communication deficit    Constipation, unspecified    COPD mixed type ([place])    on home O2   Diverticulosis    Esophageal stricture    Essential (primary) hypertension    Gastro-esophageal reflux disease without esophagitis    Glaucoma    Hiatal hernia     Hyperlipidemia, unspecified    Irritable bowel syndrome    Presence of permanent cardiac pacemaker 01/14/2020   Spinal stenosis, lumbar region, without neurogenic claudication    Steatohepatitis    Thrombocytopenia, unspecified ([place])    Type 2 diabetes mellitus with diabetic polyneuropathy ([place])    Past Surgical History:  Procedure Laterality Date   APPENDECTOMY     CARPAL TUNNEL RELEASE     right hand   CHOLECYSTECTOMY OPEN  1978   COLONOSCOPY  07-2001   mild diverticulosis   ESOPHAGOGASTRODUODENOSCOPY  0881,10-31   H Hernia,es.stricture s/p dil 56F   INTRAOCULAR LENS INSERTION Bilateral    LIVER BIOPSY  [id number]   PACEMAKER IMPLANT N/A 01/14/2020   Procedure: PACEMAKER IMPLANT;  Surgeon: Evans Lance, MD;  Location: [facility];  Service: Cardiovascular;  Laterality: N/A;   PARATHYROID EXPLORATION     TONSILLECTOMY AND ADENOIDECTOMY     TOTAL ABDOMINAL HYSTERECTOMY     ULNAR NERVE TRANSPOSITION  12/07/2011   Procedure: ULNAR NERVE DECOMPRESSION/TRANSPOSITION;this was cancelled-not done  Surgeon: Cammie Sickle., MD;  Location: [facility];  Service: Orthopedics;  Laterality: Right;  right ulnar nerve in situ decompression   ULNAR TUNNEL RELEASE  03/07/2012   Procedure: CUBITAL TUNNEL RELEASE;  Surgeon: Roseanne Kaufman, MD;  Location: [facility];  Service: Orthopedics;  Laterality: Right;  ulnar nerve release at the elbow      Allergies  Allergen Reactions   Chlordiazepoxide-Clidinium  Other (See Comments)    Sleepy,weak   Biaxin [Clarithromycin] Other (See Comments)    Foul taste, abd pain, diarrhea   Tramadol Other (See Comments)    Caused tremors   Codeine Other (See Comments)    Upset stomach    Outpatient Encounter Medications as of 09/23/2021  Medication Sig   acetaminophen (TYLENOL) 325 MG tablet Take 2 tablets (650 mg total) by mouth every 6 (six) hours as needed for mild pain (or Fever >/= 101).   ALPRAZolam (XANAX) 0.5 MG  tablet Take 0.5 tablets (0.25 mg total) by mouth at bedtime as needed for up to 30 doses for sleep.   clopidogrel (PLAVIX) 75 MG tablet Take 1 tablet (75 mg total) by mouth daily.   furosemide (LASIX) 20 MG tablet Take 1 tablet (20 mg total) by mouth daily. (Patient taking differently: Take 20 mg by mouth daily. When feet are swollen)   glucose blood (ACCU-CHEK AVIVA PLUS) test strip Use to test blood sugar four times daily. Dx: E11.9   insulin aspart (NOVOLOG FLEXPEN) 100 UNIT/ML FlexPen INJECT 12-14 UNITS UNDER THE SKIN AT BREAKFAST, 14-16 UNITS AT LUNCH, AND 12-16 UNITS AT DINNER.   insulin degludec (TRESIBA FLEXTOUCH) 200 UNIT/ML FlexTouch Pen Inject 36 Units into the skin at bedtime.   ipratropium-albuterol (DUONEB) 0.5-2.5 (3) MG/3ML SOLN 1 vial in neb every 8 hours and as needed   irbesartan (AVAPRO) 75 MG tablet Take 75 mg by mouth daily.   levalbuterol (XOPENEX HFA) 45 MCG/ACT inhaler INHALE 1-2 PUFFS EVERY 6 HOURS IF NEEDED FOR WHEEZING.   metoprolol succinate (TOPROL-XL) 50 MG 24 hr tablet Take 50 mg by mouth 2 (two) times daily.   mirabegron ER (MYRBETRIQ) 50 MG TB24 tablet Take 1 tablet (50 mg total) by mouth daily.   Multiple Vitamin (MULTIVITAMIN WITH MINERALS) TABS tablet Take 1 tablet by mouth daily.   pantoprazole (PROTONIX) 40 MG tablet Take 1 tablet (40 mg total) by mouth daily.   polyethylene glycol powder (GLYCOLAX/MIRALAX) 17 GM/SCOOP powder Take 17 g by mouth daily.   pravastatin (PRAVACHOL) 20 MG tablet Take 1 tablet (20 mg total) by mouth daily.   thiamine 100 MG tablet Take 1 tablet (100 mg total) by mouth daily.   Tiotropium Bromide-Olodaterol (STIOLTO RESPIMAT) 2.5-2.5 MCG/ACT AERS Inhale 2 puffs into the lungs daily.   VENTOLIN HFA 108 (90 Base) MCG/ACT inhaler Inhale 2 puffs into the lungs every 6 (six) hours as needed.   No facility-administered encounter medications on file as of 09/23/2021.    Review of Systems  Constitutional:  Negative for appetite change,  chills, fatigue and fever.  Respiratory:  Negative for cough, chest tightness, shortness of breath and wheezing.   Cardiovascular:  Positive for leg swelling. Negative for chest pain and palpitations.  Gastrointestinal:  Negative for abdominal distention, abdominal pain, constipation, diarrhea, nausea and vomiting.  Musculoskeletal:  Positive for gait problem. Negative for joint swelling and myalgias.  Skin:  Positive for wound. Negative for color change and pallor.       Right foot wound   Neurological:  Negative for dizziness, light-headedness and headaches.       Chronic numbness on feet   Psychiatric/Behavioral:  Negative for agitation, confusion and sleep disturbance. The patient is not nervous/anxious.    Immunization History  Administered Date(s) Administered   Fluad Quad(high Dose 65+) 01/30/2019, 03/01/2020, 02/08/2021   Influenza Split 01/07/2011, 02/22/2012   Influenza Whole 05/09/2007, 02/05/2010, 01/22/2017   Influenza, High Dose Seasonal PF 02/06/2018  Influenza,inj,Quad PF,6+ Mos 01/21/2013, 03/18/2014, 01/08/2015, 01/13/2016   Janssen (J&J) SARS-COV-2 Vaccination 01/23/2020   Moderna SARS-COV2 Booster Vaccination 05/26/2020, 11/15/2020   Pneumococcal Conjugate-13 11/24/2013   Pneumococcal Polysaccharide-23 04/23/2012   Pertinent  Health Maintenance Due  Topic Date Due   DEXA SCAN  Never done   OPHTHALMOLOGY EXAM  12/28/2018   FOOT EXAM  09/01/2021   INFLUENZA VACCINE  12/06/2021   HEMOGLOBIN A1C  02/08/2022      08/12/2021    7:44 AM 08/12/2021    7:45 PM 08/13/2021    9:00 AM 08/24/2021   11:15 AM 09/23/2021   11:04 AM  Fall Risk  Falls in the past year?    1 1  Was there an injury with Fall?    1 0  Fall Risk Category Calculator    3 2  Fall Risk Category    High Moderate  Patient Fall Risk Level High fall risk High fall risk High fall risk Moderate fall risk Moderate fall risk  Patient at Risk for Falls Due to    History of fall(s) History of fall(s)  Fall  risk Follow up    Falls evaluation completed Falls evaluation completed   Functional Status Survey:    Vitals:   09/23/21 1105  BP: 140/80  Pulse: (!) 118  Temp: (!) 97.1 F (36.2 C)  SpO2: 94%  Weight: 134 lb 6.4 oz (61 kg)  Height: 5' 3"  (1.6 m)   Body mass index is 23.81 kg/m. Physical Exam Vitals reviewed.  Constitutional:      General: She is not in acute distress.    Appearance: Normal appearance. She is normal weight. She is not ill-appearing or diaphoretic.  HENT:     Head: Normocephalic.     Mouth/Throat:     Mouth: Mucous membranes are moist.     Pharynx: Oropharynx is clear. No oropharyngeal exudate or posterior oropharyngeal erythema.  Eyes:     General: No scleral icterus.       Right eye: No discharge.        Left eye: No discharge.     Conjunctiva/sclera: Conjunctivae normal.     Pupils: Pupils are equal, round, and reactive to light.  Cardiovascular:     Rate and Rhythm: Normal rate and regular rhythm.     Pulses: Normal pulses.     Heart sounds: Normal heart sounds. No murmur heard.   No friction rub. No gallop.  Pulmonary:     Effort: Pulmonary effort is normal. No respiratory distress.     Breath sounds: Normal breath sounds. No wheezing, rhonchi or rales.  Chest:     Chest wall: No tenderness.  Abdominal:     General: Bowel sounds are normal. There is no distension.     Palpations: Abdomen is soft. There is no mass.     Tenderness: There is no abdominal tenderness. There is no right CVA tenderness, left CVA tenderness, guarding or rebound.  Musculoskeletal:        General: No swelling or tenderness. Normal range of motion.     Cervical back: Normal range of motion. No tenderness.     Right lower leg: No edema.     Left lower leg: No edema.  Feet:     Right foot:     Skin integrity: No callus or dry skin.     Toenail Condition: Right toenails are abnormally thick and long. Fungal disease present.    Left foot:     Skin integrity: No  callus  or dry skin.     Toenail Condition: Left toenails are abnormally thick and long. Fungal disease present. Lymphadenopathy:     Cervical: No cervical adenopathy.  Skin:    General: Skin is warm and dry.     Coloration: Skin is not pale.     Findings: No rash.     Comments: 3 x 2 cm wound on right inner lateral foot, wound bed partly with dark eschar and redness.No drainage or odor noted.Surrounding skin tissue with a perimeter of 2 cm erythema and warm to touch with diffuse swelling.  Neurological:     Mental Status: She is alert and oriented to person, place, and time.     Sensory: No sensory deficit.     Motor: No weakness.     Gait: Gait normal.  Psychiatric:        Mood and Affect: Mood normal.        Speech: Speech normal.        Behavior: Behavior normal.    Labs reviewed: Recent Labs    05/31/21 1119 08/09/21 1044 08/09/21 1050 08/09/21 1156 08/13/21 0209  NA 136 135 135 135 136  K 4.6 4.5 4.3 4.3 4.2  CL 94* 96* 94*  --  96*  CO2 39* 29  --   --  34*  GLUCOSE 326* 311* 317*  --  166*  BUN 12 9 10   --  10  CREATININE 0.75 0.63 0.50  --  0.54  CALCIUM 9.9 9.4  --   --  9.7   Recent Labs    05/31/21 1119 08/09/21 1044  AST 16 29  ALT 16 18  ALKPHOS  --  50  BILITOT 0.6 0.8  PROT 7.3 7.2  ALBUMIN  --  3.8   Recent Labs    11/25/20 1224 03/03/21 1126 08/09/21 1044 08/09/21 1050 08/09/21 1156 08/10/21 1419 08/13/21 0209  WBC 8.1 9.0 7.7  --   --  7.1 7.1  NEUTROABS 4,236 4,833 5.5  --   --   --   --   HGB 15.2 15.2 15.6*   < > 21.4* 14.6 14.8  HCT 47.8* 47.8* 48.2*   < > 63.0* 45.4 45.7  MCV 85.8 86.0 88.9  --   --  88.7 88.2  PLT 174 161 150  --   --  143* 142*   < > = values in this interval not displayed.   Lab Results  Component Value Date   TSH 1.621 08/10/2021   Lab Results  Component Value Date   HGBA1C 15.0 (H) 08/09/2021   Lab Results  Component Value Date   CHOL 90 08/09/2021   HDL 38 (L) 08/09/2021   LDLCALC 42 08/09/2021    LDLDIRECT 89.0 10/14/2014   TRIG 50 08/09/2021   CHOLHDL 2.4 08/09/2021    Significant Diagnostic Results in last 30 days:  CUP PACEART INCLINIC DEVICE CHECK  Result Date: 09/14/2021 Pacemaker check in clinic. Normal device function. Thresholds, sensing, impedances consistent with previous measurements. Device programmed to maximize longevity. known PAT, longest episode 16 seconds. Device programmed at appropriate safety margins. Histogram distribution appropriate for patient activity level. Device programmed to optimize intrinsic conduction. Estimated longevity 8 yr, 7 mo. Patient enrolled in remote follow-up. Patient education completed.   Assessment/Plan  1. Cellulitis of leg, right Afebrile 3 x 2 cm wound on right inner lateral foot, wound bed partly with dark eschar and redness.No drainage or odor noted.Surrounding skin tissue with a perimeter of  2 cm erythema and warm to touch with diffuse swelling. -Wound bed cleanse with saline ,pat dry, triple antibiotic ointment applied and covered with foam dressing for extra protection and absorption.change dressing.  Caregiver present advised to keep wound clean pat dry and apply triple antibiotic ointment and cover with a foam dressing or Band-Aid.  Advised to change dressing daily. -Notify provider for any worsening symptoms or redness, drainage, or odor or develops any fever or chills -Discussed treating with doxycycline as below for possible cat bite.  Side effects discussed - doxycycline (VIBRA-TABS) 100 MG tablet; Take 1 tablet (100 mg total) by mouth 2 (two) times daily for 10 days.  Dispense: 20 tablet; Refill: 0 - saccharomyces boulardii (FLORASTOR) 250 MG capsule; Take 1 capsule (250 mg total) by mouth 2 (two) times daily for 10 days.  Dispense: 20 capsule; Refill: 0  2. Open wound of right foot due to cat bite Possible cat bite -Wound management as above and notified provider for illness worsening signs of infection.Will consider home  health nurse if wound not improved -Advised to get Tdap at the pharmacy - doxycycline (VIBRA-TABS) 100 MG tablet; Take 1 tablet (100 mg total) by mouth 2 (two) times daily for 10 days.  Dispense: 20 tablet; Refill: 0 - saccharomyces boulardii (FLORASTOR) 250 MG capsule; Take 1 capsule (250 mg total) by mouth 2 (two) times daily for 10 days.  Dispense: 20 capsule; Refill: 0 - advised to get Tdap vaccine at the pharmacy.   3. Uncontrolled type 2 diabetes mellitus with hyperglycemia ([facility]) Lab Results  Component Value Date   HGBA1C 15.0 (H) 08/09/2021  No home CBG for review but states his blood sugars usually in the 200. - Ambulatory referral to Podiatry  4.Overgrown toenail  Long thick yellow-colored toenails on both feet.  Discussed follow-up with the podiatrist to trim toenails. - Ambulatory referral to Podiatry  Family/ staff Communication: Reviewed plan of care with patient and caregiver verbalized understanding  Labs/tests ordered: None   Next Appointment: As needed if wound worsen or fail to improve  Sandrea Hughs, NP

## 2021-09-28 ENCOUNTER — Other Ambulatory Visit: Payer: Self-pay | Admitting: Internal Medicine

## 2021-10-02 ENCOUNTER — Encounter (HOSPITAL_COMMUNITY): Payer: Self-pay

## 2021-10-02 ENCOUNTER — Other Ambulatory Visit: Payer: Self-pay

## 2021-10-02 ENCOUNTER — Inpatient Hospital Stay (HOSPITAL_COMMUNITY)
Admission: EM | Admit: 2021-10-02 | Discharge: 2021-10-07 | DRG: 637 | Disposition: A | Discharge status: Skilled Nursing Facility | Payer: Medicare Other | Attending: Internal Medicine | Admitting: Internal Medicine

## 2021-10-02 ENCOUNTER — Emergency Department (HOSPITAL_COMMUNITY): Payer: Medicare Other

## 2021-10-02 DIAGNOSIS — Z794 Long term (current) use of insulin: Secondary | ICD-10-CM | POA: Diagnosis not present

## 2021-10-02 DIAGNOSIS — G9341 Metabolic encephalopathy: Secondary | ICD-10-CM | POA: Diagnosis present

## 2021-10-02 DIAGNOSIS — E860 Dehydration: Secondary | ICD-10-CM | POA: Diagnosis present

## 2021-10-02 DIAGNOSIS — L97419 Non-pressure chronic ulcer of right heel and midfoot with unspecified severity: Secondary | ICD-10-CM | POA: Diagnosis not present

## 2021-10-02 DIAGNOSIS — Z950 Presence of cardiac pacemaker: Secondary | ICD-10-CM | POA: Diagnosis not present

## 2021-10-02 DIAGNOSIS — R6520 Severe sepsis without septic shock: Secondary | ICD-10-CM | POA: Diagnosis not present

## 2021-10-02 DIAGNOSIS — R41841 Cognitive communication deficit: Secondary | ICD-10-CM | POA: Diagnosis not present

## 2021-10-02 DIAGNOSIS — I2510 Atherosclerotic heart disease of native coronary artery without angina pectoris: Secondary | ICD-10-CM | POA: Diagnosis not present

## 2021-10-02 DIAGNOSIS — R1310 Dysphagia, unspecified: Secondary | ICD-10-CM | POA: Diagnosis not present

## 2021-10-02 DIAGNOSIS — K222 Esophageal obstruction: Secondary | ICD-10-CM | POA: Diagnosis not present

## 2021-10-02 DIAGNOSIS — I10 Essential (primary) hypertension: Secondary | ICD-10-CM | POA: Diagnosis not present

## 2021-10-02 DIAGNOSIS — K449 Diaphragmatic hernia without obstruction or gangrene: Secondary | ICD-10-CM | POA: Diagnosis not present

## 2021-10-02 DIAGNOSIS — Z90710 Acquired absence of both cervix and uterus: Secondary | ICD-10-CM

## 2021-10-02 DIAGNOSIS — D72829 Elevated white blood cell count, unspecified: Secondary | ICD-10-CM | POA: Diagnosis present

## 2021-10-02 DIAGNOSIS — R739 Hyperglycemia, unspecified: Secondary | ICD-10-CM | POA: Diagnosis not present

## 2021-10-02 DIAGNOSIS — N179 Acute kidney failure, unspecified: Secondary | ICD-10-CM

## 2021-10-02 DIAGNOSIS — Z0189 Encounter for other specified special examinations: Secondary | ICD-10-CM

## 2021-10-02 DIAGNOSIS — Z7902 Long term (current) use of antithrombotics/antiplatelets: Secondary | ICD-10-CM | POA: Diagnosis not present

## 2021-10-02 DIAGNOSIS — Z9981 Dependence on supplemental oxygen: Secondary | ICD-10-CM | POA: Diagnosis not present

## 2021-10-02 DIAGNOSIS — E11621 Type 2 diabetes mellitus with foot ulcer: Secondary | ICD-10-CM | POA: Diagnosis not present

## 2021-10-02 DIAGNOSIS — R000 Tachycardia, unspecified: Secondary | ICD-10-CM | POA: Diagnosis not present

## 2021-10-02 DIAGNOSIS — R7989 Other specified abnormal findings of blood chemistry: Secondary | ICD-10-CM | POA: Diagnosis present

## 2021-10-02 DIAGNOSIS — L89512 Pressure ulcer of right ankle, stage 2: Secondary | ICD-10-CM | POA: Diagnosis present

## 2021-10-02 DIAGNOSIS — K219 Gastro-esophageal reflux disease without esophagitis: Secondary | ICD-10-CM | POA: Diagnosis not present

## 2021-10-02 DIAGNOSIS — Z8673 Personal history of transient ischemic attack (TIA), and cerebral infarction without residual deficits: Secondary | ICD-10-CM | POA: Diagnosis not present

## 2021-10-02 DIAGNOSIS — Z888 Allergy status to other drugs, medicaments and biological substances status: Secondary | ICD-10-CM

## 2021-10-02 DIAGNOSIS — E43 Unspecified severe protein-calorie malnutrition: Secondary | ICD-10-CM | POA: Insufficient documentation

## 2021-10-02 DIAGNOSIS — Z87891 Personal history of nicotine dependence: Secondary | ICD-10-CM

## 2021-10-02 DIAGNOSIS — I959 Hypotension, unspecified: Secondary | ICD-10-CM | POA: Diagnosis not present

## 2021-10-02 DIAGNOSIS — E1142 Type 2 diabetes mellitus with diabetic polyneuropathy: Secondary | ICD-10-CM | POA: Diagnosis present

## 2021-10-02 DIAGNOSIS — R2681 Unsteadiness on feet: Secondary | ICD-10-CM | POA: Diagnosis not present

## 2021-10-02 DIAGNOSIS — R933 Abnormal findings on diagnostic imaging of other parts of digestive tract: Secondary | ICD-10-CM

## 2021-10-02 DIAGNOSIS — H409 Unspecified glaucoma: Secondary | ICD-10-CM | POA: Diagnosis present

## 2021-10-02 DIAGNOSIS — Z6822 Body mass index (BMI) 22.0-22.9, adult: Secondary | ICD-10-CM | POA: Diagnosis not present

## 2021-10-02 DIAGNOSIS — A419 Sepsis, unspecified organism: Secondary | ICD-10-CM

## 2021-10-02 DIAGNOSIS — Z7401 Bed confinement status: Secondary | ICD-10-CM | POA: Diagnosis not present

## 2021-10-02 DIAGNOSIS — G9340 Encephalopathy, unspecified: Secondary | ICD-10-CM | POA: Diagnosis not present

## 2021-10-02 DIAGNOSIS — M3501 Sicca syndrome with keratoconjunctivitis: Secondary | ICD-10-CM | POA: Diagnosis not present

## 2021-10-02 DIAGNOSIS — L97519 Non-pressure chronic ulcer of other part of right foot with unspecified severity: Secondary | ICD-10-CM | POA: Diagnosis present

## 2021-10-02 DIAGNOSIS — N3000 Acute cystitis without hematuria: Secondary | ICD-10-CM

## 2021-10-02 DIAGNOSIS — E1110 Type 2 diabetes mellitus with ketoacidosis without coma: Principal | ICD-10-CM | POA: Diagnosis present

## 2021-10-02 DIAGNOSIS — K2289 Other specified disease of esophagus: Secondary | ICD-10-CM | POA: Diagnosis not present

## 2021-10-02 DIAGNOSIS — R1312 Dysphagia, oropharyngeal phase: Secondary | ICD-10-CM | POA: Diagnosis not present

## 2021-10-02 DIAGNOSIS — Z008 Encounter for other general examination: Secondary | ICD-10-CM

## 2021-10-02 DIAGNOSIS — J449 Chronic obstructive pulmonary disease, unspecified: Secondary | ICD-10-CM | POA: Diagnosis not present

## 2021-10-02 DIAGNOSIS — Z9049 Acquired absence of other specified parts of digestive tract: Secondary | ICD-10-CM

## 2021-10-02 DIAGNOSIS — R1319 Other dysphagia: Secondary | ICD-10-CM | POA: Diagnosis not present

## 2021-10-02 DIAGNOSIS — K589 Irritable bowel syndrome without diarrhea: Secondary | ICD-10-CM | POA: Diagnosis present

## 2021-10-02 DIAGNOSIS — M6281 Muscle weakness (generalized): Secondary | ICD-10-CM | POA: Diagnosis not present

## 2021-10-02 DIAGNOSIS — Z20822 Contact with and (suspected) exposure to covid-19: Secondary | ICD-10-CM | POA: Diagnosis present

## 2021-10-02 DIAGNOSIS — E785 Hyperlipidemia, unspecified: Secondary | ICD-10-CM | POA: Diagnosis present

## 2021-10-02 DIAGNOSIS — Z885 Allergy status to narcotic agent status: Secondary | ICD-10-CM

## 2021-10-02 DIAGNOSIS — R0902 Hypoxemia: Secondary | ICD-10-CM | POA: Diagnosis not present

## 2021-10-02 DIAGNOSIS — M1990 Unspecified osteoarthritis, unspecified site: Secondary | ICD-10-CM | POA: Diagnosis present

## 2021-10-02 DIAGNOSIS — F419 Anxiety disorder, unspecified: Secondary | ICD-10-CM | POA: Diagnosis present

## 2021-10-02 DIAGNOSIS — Z881 Allergy status to other antibiotic agents status: Secondary | ICD-10-CM

## 2021-10-02 DIAGNOSIS — Z79899 Other long term (current) drug therapy: Secondary | ICD-10-CM

## 2021-10-02 DIAGNOSIS — R0689 Other abnormalities of breathing: Secondary | ICD-10-CM | POA: Diagnosis not present

## 2021-10-02 DIAGNOSIS — R531 Weakness: Secondary | ICD-10-CM | POA: Diagnosis not present

## 2021-10-02 HISTORY — DX: Type 2 diabetes mellitus with ketoacidosis without coma: E11.10

## 2021-10-02 LAB — CBC WITH DIFFERENTIAL/PLATELET
Abs Immature Granulocytes: 0.12 10*3/uL — ABNORMAL HIGH (ref 0.00–0.07)
Basophils Absolute: 0.1 10*3/uL (ref 0.0–0.1)
Basophils Relative: 1 %
Eosinophils Absolute: 0 10*3/uL (ref 0.0–0.5)
Eosinophils Relative: 0 %
HCT: 61.6 % — ABNORMAL HIGH (ref 36.0–46.0)
Hemoglobin: 18.8 g/dL — ABNORMAL HIGH (ref 12.0–15.0)
Immature Granulocytes: 1 %
Lymphocytes Relative: 11 %
Lymphs Abs: 1.4 10*3/uL (ref 0.7–4.0)
MCH: 28.8 pg (ref 26.0–34.0)
MCHC: 30.5 g/dL (ref 30.0–36.0)
MCV: 94.5 fL (ref 80.0–100.0)
Monocytes Absolute: 0.4 10*3/uL (ref 0.1–1.0)
Monocytes Relative: 3 %
Neutro Abs: 10.5 10*3/uL — ABNORMAL HIGH (ref 1.7–7.7)
Neutrophils Relative %: 84 %
Platelets: 209 10*3/uL (ref 150–400)
RBC: 6.52 MIL/uL — ABNORMAL HIGH (ref 3.87–5.11)
RDW: 14.8 % (ref 11.5–15.5)
WBC: 12.4 10*3/uL — ABNORMAL HIGH (ref 4.0–10.5)
nRBC: 0 % (ref 0.0–0.2)

## 2021-10-02 LAB — I-STAT CHEM 8, ED
BUN: 35 mg/dL — ABNORMAL HIGH (ref 8–23)
Calcium, Ion: 1.23 mmol/L (ref 1.15–1.40)
Chloride: 105 mmol/L (ref 98–111)
Creatinine, Ser: 0.7 mg/dL (ref 0.44–1.00)
Glucose, Bld: 463 mg/dL — ABNORMAL HIGH (ref 70–99)
HCT: 55 % — ABNORMAL HIGH (ref 36.0–46.0)
Hemoglobin: 18.7 g/dL — ABNORMAL HIGH (ref 12.0–15.0)
Potassium: 4.9 mmol/L (ref 3.5–5.1)
Sodium: 139 mmol/L (ref 135–145)
TCO2: 15 mmol/L — ABNORMAL LOW (ref 22–32)

## 2021-10-02 LAB — COMPREHENSIVE METABOLIC PANEL
ALT: 12 U/L (ref 0–44)
AST: 12 U/L — ABNORMAL LOW (ref 15–41)
Albumin: 4.2 g/dL (ref 3.5–5.0)
Alkaline Phosphatase: 62 U/L (ref 38–126)
Anion gap: 31 — ABNORMAL HIGH (ref 5–15)
BUN: 30 mg/dL — ABNORMAL HIGH (ref 8–23)
CO2: 10 mmol/L — ABNORMAL LOW (ref 22–32)
Calcium: 10.1 mg/dL (ref 8.9–10.3)
Chloride: 99 mmol/L (ref 98–111)
Creatinine, Ser: 1.63 mg/dL — ABNORMAL HIGH (ref 0.44–1.00)
GFR, Estimated: 31 mL/min — ABNORMAL LOW (ref >60)
Glucose, Bld: 468 mg/dL — ABNORMAL HIGH (ref 70–99)
Potassium: 4.8 mmol/L (ref 3.5–5.1)
Sodium: 140 mmol/L (ref 135–145)
Total Bilirubin: 1.8 mg/dL — ABNORMAL HIGH (ref 0.3–1.2)
Total Protein: 8.6 g/dL — ABNORMAL HIGH (ref 6.5–8.1)

## 2021-10-02 LAB — URINALYSIS, ROUTINE W REFLEX MICROSCOPIC
Bilirubin Urine: NEGATIVE
Glucose, UA: >=500 mg/dL — AB
Ketones, ur: 80 mg/dL — AB
Nitrite: NEGATIVE
Protein, ur: 100 mg/dL — AB
Specific Gravity, Urine: 1.022 (ref 1.005–1.030)
WBC, UA: >50 WBC/hpf — ABNORMAL HIGH (ref 0–5)
pH: 5 (ref 5.0–8.0)

## 2021-10-02 LAB — I-STAT VENOUS BLOOD GAS, ED
Acid-base deficit: 16 mmol/L — ABNORMAL HIGH (ref 0.0–2.0)
Bicarbonate: 12.6 mmol/L — ABNORMAL LOW (ref 20.0–28.0)
Calcium, Ion: 1.24 mmol/L (ref 1.15–1.40)
HCT: 54 % — ABNORMAL HIGH (ref 36.0–46.0)
Hemoglobin: 18.4 g/dL — ABNORMAL HIGH (ref 12.0–15.0)
O2 Saturation: 63 %
Potassium: 4.9 mmol/L (ref 3.5–5.1)
Sodium: 139 mmol/L (ref 135–145)
TCO2: 14 mmol/L — ABNORMAL LOW (ref 22–32)
pCO2, Ven: 37.7 mmHg — ABNORMAL LOW (ref 44–60)
pH, Ven: 7.133 — CL (ref 7.25–7.43)
pO2, Ven: 43 mmHg (ref 32–45)

## 2021-10-02 LAB — BETA-HYDROXYBUTYRIC ACID: Beta-Hydroxybutyric Acid: >8 mmol/L — ABNORMAL HIGH (ref 0.05–0.27)

## 2021-10-02 LAB — RESP PANEL BY RT-PCR (FLU A&B, COVID) ARPGX2
Influenza A by PCR: NEGATIVE
Influenza B by PCR: NEGATIVE
SARS Coronavirus 2 by RT PCR: NEGATIVE

## 2021-10-02 LAB — CBG MONITORING, ED
Glucose-Capillary: 529 mg/dL (ref 70–99)
Glucose-Capillary: 431 mg/dL — ABNORMAL HIGH (ref 70–99)
Glucose-Capillary: 343 mg/dL — ABNORMAL HIGH (ref 70–99)

## 2021-10-02 LAB — TROPONIN I (HIGH SENSITIVITY)
Troponin I (High Sensitivity): 21 ng/L — ABNORMAL HIGH (ref <18)
Troponin I (High Sensitivity): 22 ng/L — ABNORMAL HIGH (ref <18)

## 2021-10-02 LAB — LACTIC ACID, PLASMA
Lactic Acid, Venous: 2.2 mmol/L (ref 0.5–1.9)
Lactic Acid, Venous: 2 mmol/L (ref 0.5–1.9)

## 2021-10-02 LAB — PROTIME-INR
INR: 1.2 (ref 0.8–1.2)
Prothrombin Time: 15.4 seconds — ABNORMAL HIGH (ref 11.4–15.2)

## 2021-10-02 LAB — GLUCOSE, CAPILLARY: Glucose-Capillary: 370 mg/dL — ABNORMAL HIGH (ref 70–99)

## 2021-10-02 LAB — APTT: aPTT: 26 seconds (ref 24–36)

## 2021-10-02 LAB — CK: Total CK: 56 U/L (ref 38–234)

## 2021-10-02 LAB — LIPASE, BLOOD: Lipase: 24 U/L (ref 11–51)

## 2021-10-02 MED ORDER — DEXTROSE IN LACTATED RINGERS 5 % IV SOLN: INTRAVENOUS | Status: DC

## 2021-10-02 MED ORDER — ACETAMINOPHEN 325 MG PO TABS
650.0000 mg | ORAL_TABLET | Freq: Four times a day (QID) | ORAL | Status: DC | PRN
  Administered 2021-10-07: 650 mg via ORAL
  Filled 2021-10-02: qty 2

## 2021-10-02 MED ORDER — ONDANSETRON HCL 4 MG/2ML IJ SOLN
4.0000 mg | Freq: Four times a day (QID) | INTRAMUSCULAR | Status: DC | PRN
  Administered 2021-10-03 – 2021-10-04 (×3): 4 mg via INTRAVENOUS
  Filled 2021-10-02 (×3): qty 2

## 2021-10-02 MED ORDER — INSULIN REGULAR(HUMAN) IN NACL 100-0.9 UT/100ML-% IV SOLN
INTRAVENOUS | Status: DC
  Administered 2021-10-02: 12 [IU]/h via INTRAVENOUS
  Filled 2021-10-02: qty 100

## 2021-10-02 MED ORDER — VANCOMYCIN HCL 1250 MG/250ML IV SOLN
1250.0000 mg | Freq: Once | INTRAVENOUS | Status: AC
  Administered 2021-10-02: 1250 mg via INTRAVENOUS
  Filled 2021-10-02: qty 250

## 2021-10-02 MED ORDER — LACTATED RINGERS IV BOLUS (SEPSIS)
1000.0000 mL | Freq: Once | INTRAVENOUS | Status: AC
  Administered 2021-10-02: 1000 mL via INTRAVENOUS

## 2021-10-02 MED ORDER — SODIUM CHLORIDE 0.9 % IV SOLN
2.0000 g | INTRAVENOUS | Status: AC
  Administered 2021-10-03 – 2021-10-07 (×5): 2 g via INTRAVENOUS
  Filled 2021-10-02 (×5): qty 20

## 2021-10-02 MED ORDER — LACTATED RINGERS IV BOLUS
1000.0000 mL | Freq: Once | INTRAVENOUS | Status: AC
Start: 2021-10-02 — End: 2021-10-02
  Administered 2021-10-02: 1000 mL via INTRAVENOUS

## 2021-10-02 MED ORDER — VANCOMYCIN HCL IN DEXTROSE 1-5 GM/200ML-% IV SOLN: 1000.0000 mg | Freq: Once | INTRAVENOUS | Status: DC

## 2021-10-02 MED ORDER — SODIUM CHLORIDE 0.9 % IV SOLN
2.0000 g | Freq: Once | INTRAVENOUS | Status: AC
  Administered 2021-10-02: 2 g via INTRAVENOUS
  Filled 2021-10-02: qty 12.5

## 2021-10-02 MED ORDER — VANCOMYCIN VARIABLE DOSE PER UNSTABLE RENAL FUNCTION (PHARMACIST DOSING): Status: DC

## 2021-10-02 MED ORDER — PANTOPRAZOLE SODIUM 40 MG PO TBEC
40.0000 mg | DELAYED_RELEASE_TABLET | Freq: Every day | ORAL | Status: DC
  Administered 2021-10-03 – 2021-10-07 (×5): 40 mg via ORAL
  Filled 2021-10-02 (×5): qty 1

## 2021-10-02 MED ORDER — LACTATED RINGERS IV SOLN: INTRAVENOUS | Status: DC

## 2021-10-02 MED ORDER — INSULIN REGULAR(HUMAN) IN NACL 100-0.9 UT/100ML-% IV SOLN: INTRAVENOUS | Status: DC

## 2021-10-02 MED ORDER — HEPARIN SODIUM (PORCINE) 5000 UNIT/ML IJ SOLN
5000.0000 [IU] | Freq: Three times a day (TID) | INTRAMUSCULAR | Status: DC
  Administered 2021-10-03 – 2021-10-07 (×13): 5000 [IU] via SUBCUTANEOUS
  Filled 2021-10-02 (×13): qty 1

## 2021-10-02 MED ORDER — LACTATED RINGERS IV BOLUS
1000.0000 mL | Freq: Once | INTRAVENOUS | Status: AC
  Administered 2021-10-02: 1000 mL via INTRAVENOUS

## 2021-10-02 MED ORDER — SODIUM CHLORIDE 0.9 % IV SOLN: 2.0000 g | INTRAVENOUS | Status: DC

## 2021-10-02 MED ORDER — METRONIDAZOLE 500 MG/100ML IV SOLN
500.0000 mg | Freq: Once | INTRAVENOUS | Status: AC
  Administered 2021-10-02: 500 mg via INTRAVENOUS
  Filled 2021-10-02: qty 100

## 2021-10-02 MED ORDER — DEXTROSE 50 % IV SOLN: 0.0000 mL | INTRAVENOUS | Status: DC | PRN

## 2021-10-02 MED ORDER — LACTATED RINGERS IV BOLUS: 20.0000 mL/kg | Freq: Once | INTRAVENOUS | Status: DC

## 2021-10-02 NOTE — Progress Notes (Signed)
Pharmacy Antibiotic Note  [name] is a 86 y.o. female admitted on 10/02/2021 presenting with AMS, concern for sepsis.  Pharmacy has been consulted for vancomycin and cefepime dosing.  SCr BL ~ 0.5, 1.63 on admission  Plan: Vancomycin 1250 mg IV x 1, then variable dosing d/t unstable renal function Cefepime 2g IV every 24h Monitor renal function, Cx and clinical progression to narrow Vancomycin levels as needed     Temp (24hrs), Avg:99.9 F (37.7 C), Min:99.9 F (37.7 C), Max:99.9 F (37.7 C)  Recent Labs  Lab 10/02/21 1835  WBC 12.4*  CREATININE 1.63*  LATICACIDVEN 2.2*    Estimated Creatinine Clearance: 21.3 mL/min (A) (by C-G formula based on SCr of 1.63 mg/dL (H)).    Allergies  Allergen Reactions   Chlordiazepoxide-Clidinium Other (See Comments)    Sleepy,weak   Biaxin [Clarithromycin] Other (See Comments)    Foul taste, abd pain, diarrhea   Tramadol Other (See Comments)    Caused tremors   Codeine Other (See Comments)    Upset stomach    Bertis Ruddy, PharmD Clinical Pharmacist ED Pharmacist Phone # [phone] 10/02/2021 8:26 PM

## 2021-10-02 NOTE — Assessment & Plan Note (Signed)
Acute.  Continue with IV Rocephin.  Awaiting blood and urine cultures.  Repeat CBC in the morning.

## 2021-10-02 NOTE — Assessment & Plan Note (Addendum)
Stable.  Continue Protonix 40 mg.

## 2021-10-02 NOTE — Subjective & Objective (Signed)
Chief complaint.  Sent to the ER History of present illness: 86 year old white female who apparently lives alone.  She has an apparent caretaker who has not been to her house in 4 days.  EMS was called but unclear by who.  Patient unable to give any history.  She knows she is in the hospital but does not know the date or the time.  On arrival temp 99.9 heart rate 116 blood pressure 156/67 satting 96% on room air.  Labs: Venous pH 7.13, PCO2 37, PO2 43  Sodium 140, potassium 4.8, bicarb 10, BUN of 30, creatinine 1.6, anion gap 31  White count 12.4, hemoglobin 18.8, platelets of 209  Lactic acid of 2.2  UA shows specific already 1.022, ketones 80, leukocyte Estrace positive, bacteria many, WBCs greater than 50.  Patient ordered 20 cc cc per kilo of IV fluids.  Started on insulin drip.  Started on cefepime and vancomycin and Flagyl.  Chest x-ray negative for acute cardiopulmonary disease.  I personally interpreted the image.  EKG which I personally interpreted shows paced rhythm.  Tachycardia.

## 2021-10-02 NOTE — Assessment & Plan Note (Signed)
Sepsis due to DKA, acute cystitis.  Evidenced by acute kidney injury with a creatinine of 1.6.  Baseline 0.8.  Continue with IV fluids.  Repeat CMP in the morning.

## 2021-10-02 NOTE — ED Notes (Signed)
Patient concerned about someone caring for her cat.  Multiple attempts made to locate someone to call and patient not aware of anyone.

## 2021-10-02 NOTE — Assessment & Plan Note (Signed)
Stable

## 2021-10-02 NOTE — Assessment & Plan Note (Addendum)
Chronic.  Wound appears old.  Does not appear infected. We will have wound consult to the patient.

## 2021-10-02 NOTE — ED Provider Notes (Signed)
[facility] EMERGENCY DEPARTMENT Provider Note   CSN: [id number] Arrival date & time: 10/02/21  1804     History  Chief Complaint  Patient presents with   Hyperglycemia    Unable to take care of herself      [name] is a 86 y.o. female.  Level 5 caveat for altered mental status.  Patient with history of COPD, previous stroke, diabetes, pacemaker, previous MI presenting from home with hyperglycemia, generalized weakness, inability to care for self.  Unknown who called EMS.  She apparently has been at home by herself for the past 4 days as her caregiver has been ill.  No one has been giving her her medications and she has not been able get off the couch.  EMS found her to be tachycardic, hyperglycemic and confused.  She complains of a dry mouth and she complains of feeling "sick" but cannot elaborate further.  Does not know how long she is been feeling this way.  Has had nausea but no vomiting.  Denies chest pain or shortness of breath.  Denies abdominal pain.  Denies pain with urination or blood in the urine.  The history is provided by the patient and the EMS personnel. The history is limited by the condition of the patient.  Hyperglycemia     Home Medications Prior to Admission medications   Medication Sig Start Date End Date Taking? Authorizing Provider  acetaminophen (TYLENOL) 325 MG tablet Take 2 tablets (650 mg total) by mouth every 6 (six) hours as needed for mild pain (or Fever >/= 101). 01/23/20   Domenic Polite, MD  ALPRAZolam Duanne Moron) 0.5 MG tablet Take 0.5 tablets (0.25 mg total) by mouth at bedtime as needed for up to 30 doses for sleep. 03/03/21   Ngetich, Dinah C, NP  clopidogrel (PLAVIX) 75 MG tablet Take 1 tablet (75 mg total) by mouth daily. 09/14/21   Maryetta Friar, PA-C  doxycycline (VIBRA-TABS) 100 MG tablet Take 1 tablet (100 mg total) by mouth 2 (two) times daily for 10 days. 09/23/21 10/03/21  Ngetich, Dinah C, NP  furosemide  (LASIX) 20 MG tablet Take 1 tablet (20 mg total) by mouth daily. Patient taking differently: Take 20 mg by mouth daily. When feet are swollen 08/13/20 10/15/21  Evans Lance, MD  glucose blood (ACCU-CHEK AVIVA PLUS) test strip Use to test blood sugar four times daily. Dx: E11.9 03/07/21   Wardell Honour, MD  insulin aspart (NOVOLOG FLEXPEN) 100 UNIT/ML FlexPen INJECT 12-14 UNITS UNDER THE SKIN AT BREAKFAST, 14-16 UNITS AT LUNCH, AND 12-16 UNITS AT DINNER. 12/08/20   Lauree Chandler, NP  insulin degludec (TRESIBA FLEXTOUCH) 200 UNIT/ML FlexTouch Pen Inject 36 Units into the skin at bedtime. 11/18/20   Lauree Chandler, NP  ipratropium-albuterol (DUONEB) 0.5-2.5 (3) MG/3ML SOLN 1 vial in neb every 8 hours and as needed 01/17/21   Baird Lyons D, MD  irbesartan (AVAPRO) 75 MG tablet Take 75 mg by mouth daily.    [provider]  levalbuterol (XOPENEX HFA) 45 MCG/ACT inhaler INHALE 1-2 PUFFS EVERY 6 HOURS IF NEEDED FOR WHEEZING. 05/31/21   Baird Lyons D, MD  metoprolol succinate (TOPROL-XL) 50 MG 24 hr tablet Take 50 mg by mouth 2 (two) times daily.    [provider]  mirabegron ER (MYRBETRIQ) 50 MG TB24 tablet Take 1 tablet (50 mg total) by mouth daily. 09/16/21   Wardell Honour, MD  Multiple Vitamin (MULTIVITAMIN WITH MINERALS) TABS tablet Take  1 tablet by mouth daily. 08/14/21   Pokhrel, Corrie Mckusick, MD  pantoprazole (PROTONIX) 40 MG tablet Take 1 tablet (40 mg total) by mouth daily. 08/13/21 08/13/22  Pokhrel, Corrie Mckusick, MD  polyethylene glycol powder (GLYCOLAX/MIRALAX) 17 GM/SCOOP powder Take 17 g by mouth daily.    [provider]  pravastatin (PRAVACHOL) 20 MG tablet Take 1 tablet (20 mg total) by mouth daily. 02/28/21   Wardell Honour, MD  saccharomyces boulardii (FLORASTOR) 250 MG capsule Take 1 capsule (250 mg total) by mouth 2 (two) times daily for 10 days. 09/23/21 10/03/21  Ngetich, Dinah C, NP  thiamine 100 MG tablet Take 1 tablet (100 mg total) by mouth daily.  08/14/21   Pokhrel, Corrie Mckusick, MD  Tiotropium Bromide-Olodaterol (STIOLTO RESPIMAT) 2.5-2.5 MCG/ACT AERS Inhale 2 puffs into the lungs daily. 03/09/21   Deneise Lever, MD  VENTOLIN HFA 108 (90 Base) MCG/ACT inhaler Inhale 2 puffs into the lungs every 6 (six) hours as needed. 08/24/21   [provider]      Allergies    Chlordiazepoxide-clidinium, Biaxin [clarithromycin], Tramadol, and Codeine    Review of Systems   Review of Systems  Unable to perform ROS: Acuity of condition   Physical Exam Updated Vital Signs BP (!) 156/67   Pulse (!) 116   Temp 99.9 F (37.7 C) (Rectal)   Resp (!) 23   SpO2 96%  Physical Exam Vitals and nursing note reviewed.  Constitutional:      General: She is in acute distress.     Appearance: She is well-developed. She is ill-appearing.     Comments: Ill-appearing, dry mucous membranes  HENT:     Head: Normocephalic and atraumatic.     Mouth/Throat:     Mouth: Mucous membranes are dry.     Pharynx: No oropharyngeal exudate.  Eyes:     Conjunctiva/sclera: Conjunctivae normal.     Pupils: Pupils are equal, round, and reactive to light.  Neck:     Comments: No meningismus. Cardiovascular:     Rate and Rhythm: Regular rhythm. Tachycardia present.     Heart sounds: Normal heart sounds. No murmur heard.    Comments: Tachycardic 130s. Pulmonary:     Effort: Respiratory distress present.     Breath sounds: Normal breath sounds.     Comments: Tachypneic but no wheezing Abdominal:     Palpations: Abdomen is soft.     Tenderness: There is no abdominal tenderness. There is no guarding or rebound.  Musculoskeletal:        General: No tenderness. Normal range of motion.     Cervical back: Normal range of motion and neck supple.     Comments: Chronic appearing wounds to feet  Skin:    General: Skin is warm.  Neurological:     Mental Status: She is alert.     Cranial Nerves: No cranial nerve deficit.     Motor: No abnormal muscle tone.      Coordination: Coordination normal.     Comments: Oriented to person and place.  5/5 strength throughout.  No facial asymmetry.  No pronator drift.Marland Kitchen   Psychiatric:        Behavior: Behavior normal.    ED Results / Procedures / Treatments   Labs (all labs ordered are listed, but only abnormal results are displayed) Labs Reviewed  LACTIC ACID, PLASMA - Abnormal; Notable for the following components:      Result Value   Lactic Acid, Venous 2.2 (*)    All other  components within normal limits  LACTIC ACID, PLASMA - Abnormal; Notable for the following components:   Lactic Acid, Venous 2.0 (*)    All other components within normal limits  COMPREHENSIVE METABOLIC PANEL - Abnormal; Notable for the following components:   CO2 10 (*)    Glucose, Bld 468 (*)    BUN 30 (*)    Creatinine, Ser 1.63 (*)    Total Protein 8.6 (*)    AST 12 (*)    Total Bilirubin 1.8 (*)    GFR, Estimated 31 (*)    Anion gap 31 (*)    All other components within normal limits  CBC WITH DIFFERENTIAL/PLATELET - Abnormal; Notable for the following components:   WBC 12.4 (*)    RBC 6.52 (*)    Hemoglobin 18.8 (*)    HCT 61.6 (*)    Neutro Abs 10.5 (*)    Abs Immature Granulocytes 0.12 (*)    All other components within normal limits  URINALYSIS, ROUTINE W REFLEX MICROSCOPIC - Abnormal; Notable for the following components:   APPearance CLOUDY (*)    Glucose, UA >=500 (*)    Hgb urine dipstick SMALL (*)    Ketones, ur 80 (*)    Protein, ur 100 (*)    Leukocytes,Ua LARGE (*)    WBC, UA >50 (*)    Bacteria, UA MANY (*)    All other components within normal limits  BETA-HYDROXYBUTYRIC ACID - Abnormal; Notable for the following components:   Beta-Hydroxybutyric Acid >8.00 (*)    All other components within normal limits  PROTIME-INR - Abnormal; Notable for the following components:   Prothrombin Time 15.4 (*)    All other components within normal limits  GLUCOSE, CAPILLARY - Abnormal; Notable for the  following components:   Glucose-Capillary 370 (*)    All other components within normal limits  CBG MONITORING, ED - Abnormal; Notable for the following components:   Glucose-Capillary 529 (*)    All other components within normal limits  I-STAT CHEM 8, ED - Abnormal; Notable for the following components:   BUN 35 (*)    Glucose, Bld 463 (*)    TCO2 15 (*)    Hemoglobin 18.7 (*)    HCT 55.0 (*)    All other components within normal limits  I-STAT VENOUS BLOOD GAS, ED - Abnormal; Notable for the following components:   pH, Ven 7.133 (*)    pCO2, Ven 37.7 (*)    Bicarbonate 12.6 (*)    TCO2 14 (*)    Acid-base deficit 16.0 (*)    HCT 54.0 (*)    Hemoglobin 18.4 (*)    All other components within normal limits  CBG MONITORING, ED - Abnormal; Notable for the following components:   Glucose-Capillary 431 (*)    All other components within normal limits  CBG MONITORING, ED - Abnormal; Notable for the following components:   Glucose-Capillary 343 (*)    All other components within normal limits  TROPONIN I (HIGH SENSITIVITY) - Abnormal; Notable for the following components:   Troponin I (High Sensitivity) 21 (*)    All other components within normal limits  TROPONIN I (HIGH SENSITIVITY) - Abnormal; Notable for the following components:   Troponin I (High Sensitivity) 22 (*)    All other components within normal limits  RESP PANEL BY RT-PCR (FLU A&B, COVID) ARPGX2  CULTURE, BLOOD (ROUTINE X 2)  CULTURE, BLOOD (ROUTINE X 2)  URINE CULTURE  LIPASE, BLOOD  CK  APTT  I-STAT VENOUS BLOOD GAS, ED    EKG EKG Interpretation  Date/Time:  Sunday Oct 02 2021 18:25:56 EDT Ventricular Rate:  130 PR Interval:  75 QRS Duration: 159 QT Interval:  401 QTC Calculation: 590 R Axis:   -83 Text Interpretation: Ventricular-paced rhythm No further analysis attempted due to paced rhythm Rate faster Confirmed by Ezequiel Essex [phone]) on 10/02/2021 6:36:33 PM  Radiology DG Chest Port 1  View  Result Date: 10/02/2021 CLINICAL DATA:  Questionable sepsis.  Evaluate for abnormality. EXAM: PORTABLE CHEST 1 VIEW COMPARISON:  Chest two views 02/08/2021 FINDINGS: Left chest wall cardiac pacer with leads overlying the right atrium and right ventricle. Cardiac silhouette and mediastinal contours are unchanged and within normal limits with moderate calcification again seen within the aortic arch. Surgical clips overlie the midline neck base. The lungs are clear. No pleural effusion or pneumothorax. Minimal levocurvature of the midthoracic spine. IMPRESSION: No acute cardiopulmonary disease process. Electronically Signed   By: Yvonne Kendall M.D.   On: 10/02/2021 19:13    Procedures .Critical Care Performed by: Ezequiel Essex, MD Authorized by: Ezequiel Essex, MD   Critical care provider statement:    Critical care time (minutes):  60   Critical care time was exclusive of:  Separately billable procedures and treating other patients   Critical care was necessary to treat or prevent imminent or life-threatening deterioration of the following conditions:  Sepsis, endocrine crisis and dehydration   Critical care was time spent personally by me on the following activities:  Development of treatment plan with patient or surrogate, discussions with consultants, evaluation of patient's response to treatment, examination of patient, ordering and review of laboratory studies, ordering and review of radiographic studies, ordering and performing treatments and interventions, pulse oximetry, re-evaluation of patient's condition and review of old charts   I assumed direction of critical care for this patient from another provider in my specialty: no     Care discussed with: admitting provider      Medications Ordered in ED Medications  lactated ringers infusion (has no administration in time range)  lactated ringers bolus 1,000 mL (has no administration in time range)  ceFEPIme (MAXIPIME) 2 g in  sodium chloride 0.9 % 100 mL IVPB (has no administration in time range)  metroNIDAZOLE (FLAGYL) IVPB 500 mg (has no administration in time range)  vancomycin (VANCOCIN) IVPB 1000 mg/200 mL premix (has no administration in time range)    ED Course/ Medical Decision Making/ A&P                           Medical Decision Making Amount and/or Complexity of Data Reviewed Independent Historian: EMS Labs: ordered. Decision-making details documented in ED Course. Radiology: ordered and independent interpretation performed. Decision-making details documented in ED Course. ECG/medicine tests: ordered and independent interpretation performed. Decision-making details documented in ED Course.  Risk Prescription drug management. Decision regarding hospitalization.  Patient from home with a 4-day history of not taking her medications, not eating or drinking, not able to care for self.  She is tachycardic, tachypneic and febrile on arrival.  Code sepsis was activated.  Patient given broad-spectrum antibiotics after blood cultures obtained.  She is also hyperglycemic over 500.  Patient is treated with broad-spectrum antibiotics and IV fluids.  Work-up is concerning for DKA with bicarb of 10, anion gap of 31 pH 7.1.  She is initiated on insulin infusion  Blood pressure mental status remained stable.  She remains tachycardic  in the 1 teens.  Patient given aggressive IV hydration and IV insulin as well as broad-spectrum antibiotics.  Urinalysis is concerning for urinary tract infection and will treat for sepsis and DKA.  Admission discussed with Dr. Bridgett Larsson        Final Clinical Impression(s) / ED Diagnoses Final diagnoses:  Diabetic ketoacidosis without coma associated with type 2 diabetes mellitus ([place])  Dehydration  Sepsis with encephalopathy without septic shock, due to unspecified organism [facility])    Rx / DC Orders ED Discharge Orders     None         Ezequiel Essex, MD 10/02/21  2327

## 2021-10-02 NOTE — Assessment & Plan Note (Signed)
Continue with IV fluids.  Likely due to dehydration from DKA.  Also could be from acute cystitis.  Repeat BMP in the morning.

## 2021-10-02 NOTE — Assessment & Plan Note (Signed)
Admit to progressive telemetry bed.  Inpatient.  DKA protocol.  IV insulin.  IV fluids.  BMP every 4 hours. DKA (diabetic ketoacidosis) ([place]) is a Acute illness/condition that poses a threat to life or bodily function.

## 2021-10-02 NOTE — Progress Notes (Signed)
Pt being followed by [facility] for Sepsis protocol. 

## 2021-10-02 NOTE — Assessment & Plan Note (Signed)
Chronic. 

## 2021-10-02 NOTE — Assessment & Plan Note (Signed)
Acute.  Likely due to combination of DKA, acute cystitis, dehydration, acute kidney injury.

## 2021-10-02 NOTE — H&P (Signed)
History and Physical    [name] SUP:[id number] DOB: [birth year] DOA: 10/02/2021  DOS: the patient was seen and examined on 10/02/2021  PCP: Wardell Honour, MD   Patient coming from: Home  I have personally briefly reviewed patient's old medical records in [facility]  Chief complaint.  Sent to the ER History of present illness: 86 year old white female who apparently lives alone.  She has an apparent caretaker who has not been to her house in 4 days.  EMS was called but unclear by who.  Patient unable to give any history.  She knows she is in the hospital but does not know the date or the time.  On arrival temp 99.9 heart rate 116 blood pressure 156/67 satting 96% on room air.  Labs: Venous pH 7.13, PCO2 37, PO2 43  Sodium 140, potassium 4.8, bicarb 10, BUN of 30, creatinine 1.6, anion gap 31  White count 12.4, hemoglobin 18.8, platelets of 209  Lactic acid of 2.2  UA shows specific already 1.022, ketones 80, leukocyte Estrace positive, bacteria many, WBCs greater than 50.  Patient ordered 20 cc cc per kilo of IV fluids.  Started on insulin drip.  Started on cefepime and vancomycin and Flagyl.  Chest x-ray negative for acute cardiopulmonary disease.  I personally interpreted the image.  EKG which I personally interpreted shows paced rhythm.  Tachycardia.   ED Course: Noted to be in DKA with a pH of 7.1, bicarb of 10, serum glucose of 468.  Started on IV fluids, insulin drip.  Review of Systems:  Review of Systems  Unable to perform ROS: Mental status change   Past Medical History:  Diagnosis Date   Acute ischemic stroke ([place]) 08/10/2021   Anxiety disorder, unspecified    Arthritis    Benign paroxysmal positional vertigo 08/09/2012   Chronic rhinitis    Cognitive communication deficit    Constipation, unspecified    COPD mixed type ([place])    on home O2   Diverticulosis    Esophageal stricture    Essential (primary) hypertension    Gastro-esophageal  reflux disease without esophagitis    Glaucoma    Hiatal hernia    Hyperlipidemia, unspecified    Irritable bowel syndrome    Ischemic stroke ([place]) 08/09/2021   Presence of permanent cardiac pacemaker 01/14/2020   Spinal stenosis, lumbar region, without neurogenic claudication    Steatohepatitis    Thrombocytopenia, unspecified ([place])    Type 2 diabetes mellitus with diabetic polyneuropathy ([place])     Past Surgical History:  Procedure Laterality Date   APPENDECTOMY     CARPAL TUNNEL RELEASE     right hand   CHOLECYSTECTOMY OPEN  1978   COLONOSCOPY  07-2001   mild diverticulosis   ESOPHAGOGASTRODUODENOSCOPY  5929,24-46   H Hernia,es.stricture s/p dil 4F   INTRAOCULAR LENS INSERTION Bilateral    LIVER BIOPSY  [id number]   PACEMAKER IMPLANT N/A 01/14/2020   Procedure: PACEMAKER IMPLANT;  Surgeon: Evans Lance, MD;  Location: [facility] [employer] [facility];  Service: Cardiovascular;  Laterality: N/A;   PARATHYROID EXPLORATION     TONSILLECTOMY AND ADENOIDECTOMY     TOTAL ABDOMINAL HYSTERECTOMY     ULNAR NERVE TRANSPOSITION  12/07/2011   Procedure: ULNAR NERVE DECOMPRESSION/TRANSPOSITION;this was cancelled-not done  Surgeon: Cammie Sickle., MD;  Location: [facility];  Service: Orthopedics;  Laterality: Right;  right ulnar nerve in situ decompression   ULNAR TUNNEL RELEASE  03/07/2012   Procedure: CUBITAL TUNNEL RELEASE;  Surgeon: Roseanne Kaufman, MD;  Location: [employer];  Service: Orthopedics;  Laterality: Right;  ulnar nerve release at the elbow       reports that she quit smoking about 33 years ago. Her smoking use included cigarettes. She has never used smokeless tobacco. She reports that she does not drink alcohol and does not use drugs.  Allergies  Allergen Reactions   Chlordiazepoxide-Clidinium Other (See Comments)    Sleepy,weak   Biaxin [Clarithromycin] Other (See Comments)    Foul taste, abd pain, diarrhea   Tramadol Other (See Comments)    Caused  tremors   Codeine Other (See Comments)    Upset stomach    Family History  Problem Relation Age of Onset   Heart disease Father    Lung cancer Mother        small cell;Byssinosis   Lung cancer Sister    Liver cancer Sister        ? mets from another area of the body   Diabetes Other        grandmother   Stroke Maternal Grandfather     Prior to Admission medications   Medication Sig Start Date End Date Taking? Authorizing Provider  acetaminophen (TYLENOL) 325 MG tablet Take 2 tablets (650 mg total) by mouth every 6 (six) hours as needed for mild pain (or Fever >/= 101). 01/23/20   Domenic Polite, MD  ALPRAZolam Duanne Moron) 0.5 MG tablet Take 0.5 tablets (0.25 mg total) by mouth at bedtime as needed for up to 30 doses for sleep. 03/03/21   Ngetich, Dinah C, NP  clopidogrel (PLAVIX) 75 MG tablet Take 1 tablet (75 mg total) by mouth daily. 09/14/21   Azriel Friar, PA-C  doxycycline (VIBRA-TABS) 100 MG tablet Take 1 tablet (100 mg total) by mouth 2 (two) times daily for 10 days. 09/23/21 10/03/21  Ngetich, Dinah C, NP  furosemide (LASIX) 20 MG tablet Take 1 tablet (20 mg total) by mouth daily. Patient taking differently: Take 20 mg by mouth daily. When feet are swollen 08/13/20 10/15/21  Evans Lance, MD  glucose blood (ACCU-CHEK AVIVA PLUS) test strip Use to test blood sugar four times daily. Dx: E11.9 03/07/21   Wardell Honour, MD  insulin aspart (NOVOLOG FLEXPEN) 100 UNIT/ML FlexPen INJECT 12-14 UNITS UNDER THE SKIN AT BREAKFAST, 14-16 UNITS AT LUNCH, AND 12-16 UNITS AT DINNER. 12/08/20   Lauree Chandler, NP  insulin degludec (TRESIBA FLEXTOUCH) 200 UNIT/ML FlexTouch Pen Inject 36 Units into the skin at bedtime. 11/18/20   Lauree Chandler, NP  ipratropium-albuterol (DUONEB) 0.5-2.5 (3) MG/3ML SOLN 1 vial in neb every 8 hours and as needed 01/17/21   Baird Lyons D, MD  irbesartan (AVAPRO) 75 MG tablet Take 75 mg by mouth daily.    [provider]  levalbuterol  (XOPENEX HFA) 45 MCG/ACT inhaler INHALE 1-2 PUFFS EVERY 6 HOURS IF NEEDED FOR WHEEZING. 05/31/21   Baird Lyons D, MD  metoprolol succinate (TOPROL-XL) 50 MG 24 hr tablet Take 50 mg by mouth 2 (two) times daily.    [provider]  mirabegron ER (MYRBETRIQ) 50 MG TB24 tablet Take 1 tablet (50 mg total) by mouth daily. 09/16/21   Wardell Honour, MD  Multiple Vitamin (MULTIVITAMIN WITH MINERALS) TABS tablet Take 1 tablet by mouth daily. 08/14/21   Pokhrel, Corrie Mckusick, MD  pantoprazole (PROTONIX) 40 MG tablet Take 1 tablet (40 mg total) by mouth daily. 08/13/21 08/13/22  Pokhrel, Corrie Mckusick, MD  polyethylene glycol powder (GLYCOLAX/MIRALAX)  17 GM/SCOOP powder Take 17 g by mouth daily.    [provider]  pravastatin (PRAVACHOL) 20 MG tablet Take 1 tablet (20 mg total) by mouth daily. 02/28/21   Wardell Honour, MD  saccharomyces boulardii (FLORASTOR) 250 MG capsule Take 1 capsule (250 mg total) by mouth 2 (two) times daily for 10 days. 09/23/21 10/03/21  Ngetich, Dinah C, NP  thiamine 100 MG tablet Take 1 tablet (100 mg total) by mouth daily. 08/14/21   Pokhrel, Corrie Mckusick, MD  Tiotropium Bromide-Olodaterol (STIOLTO RESPIMAT) 2.5-2.5 MCG/ACT AERS Inhale 2 puffs into the lungs daily. 03/09/21   Deneise Lever, MD  VENTOLIN HFA 108 (90 Base) MCG/ACT inhaler Inhale 2 puffs into the lungs every 6 (six) hours as needed. 08/24/21   [provider]    Physical Exam: Vitals:   10/02/21 1810 10/02/21 1845 10/02/21 2019 10/02/21 2100  BP: (!) 156/67 (!) 138/58 124/75 126/78  Pulse: (!) 116 (!) 126 (!) 130 (!) 41  Resp: (!) 23 (!) 25 18 20   Temp: 99.9 F (37.7 C)     TempSrc: Rectal     SpO2: 96% 97% 99% 99%    Physical Exam Vitals and nursing note reviewed.  Constitutional:      Appearance: She is ill-appearing.     Comments: Awake.  Confused.  Smells of old stale urine.  HENT:     Head: Normocephalic and atraumatic.     Nose: Nose normal.  Eyes:     General: No scleral  icterus. Cardiovascular:     Rate and Rhythm: Regular rhythm. Tachycardia present.  Pulmonary:     Effort: Pulmonary effort is normal. No respiratory distress.  Abdominal:     General: Bowel sounds are normal. There is no distension.     Tenderness: There is no abdominal tenderness.  Skin:    General: Skin is warm and dry.     Capillary Refill: Capillary refill takes less than 2 seconds.     Findings: Lesion present.     Comments: Diabetic foot wound. Right foot. Near medial arch. See picture  Neurological:     Mental Status: She is disoriented.        Labs on Admission: I have personally reviewed following labs and imaging studies  CBC: Recent Labs  Lab 10/02/21 1835 10/02/21 2029 10/02/21 2030  WBC 12.4*  --   --   NEUTROABS 10.5*  --   --   HGB 18.8* 18.7* 18.4*  HCT 61.6* 55.0* 54.0*  MCV 94.5  --   --   PLT 209  --   --    Basic Metabolic Panel: Recent Labs  Lab 10/02/21 1835 10/02/21 2029 10/02/21 2030  NA 140 139 139  K 4.8 4.9 4.9  CL 99 105  --   CO2 10*  --   --   GLUCOSE 468* 463*  --   BUN 30* 35*  --   CREATININE 1.63* 0.70  --   CALCIUM 10.1  --   --    GFR: Estimated Creatinine Clearance: 43.3 mL/min (by C-G formula based on SCr of 0.7 mg/dL). Liver Function Tests: Recent Labs  Lab 10/02/21 1835  AST 12*  ALT 12  ALKPHOS 62  BILITOT 1.8*  PROT 8.6*  ALBUMIN 4.2   Recent Labs  Lab 10/02/21 1835  LIPASE 24   No results for input(s): AMMONIA in the last 168 hours. Coagulation Profile: Recent Labs  Lab 10/02/21 2017  INR 1.2   Cardiac Enzymes: Recent Labs  Lab 10/02/21 1835 10/02/21 2017  CKTOTAL 56  --   TROPONINIHS 21* 22*   BNP (last 3 results) No results for input(s): PROBNP in the last 8760 hours. HbA1C: No results for input(s): HGBA1C in the last 72 hours. CBG: Recent Labs  Lab 10/02/21 1825  GLUCAP 529*   Lipid Profile: No results for input(s): CHOL, HDL, LDLCALC, TRIG, CHOLHDL, LDLDIRECT in the last 72  hours. Thyroid Function Tests: No results for input(s): TSH, T4TOTAL, FREET4, T3FREE, THYROIDAB in the last 72 hours. Anemia Panel: No results for input(s): VITAMINB12, FOLATE, FERRITIN, TIBC, IRON, RETICCTPCT in the last 72 hours. Urine analysis:    Component Value Date/Time   COLORURINE YELLOW 10/02/2021 2020   APPEARANCEUR CLOUDY (A) 10/02/2021 2020   LABSPEC 1.022 10/02/2021 2020   PHURINE 5.0 10/02/2021 2020   GLUCOSEU >=500 (A) 10/02/2021 2020   GLUCOSEU 100 (A) 05/11/2017 1015   HGBUR SMALL (A) 10/02/2021 2020   BILIRUBINUR NEGATIVE 10/02/2021 2020   BILIRUBINUR Negative 06/16/2021 1216   KETONESUR 80 (A) 10/02/2021 2020   PROTEINUR 100 (A) 10/02/2021 2020   UROBILINOGEN 0.2 06/16/2021 1216   UROBILINOGEN 0.2 05/11/2017 1015   NITRITE NEGATIVE 10/02/2021 2020   LEUKOCYTESUR LARGE (A) 10/02/2021 2020    Radiological Exams on Admission: I have personally reviewed images DG Chest Port 1 View  Result Date: 10/02/2021 CLINICAL DATA:  Questionable sepsis.  Evaluate for abnormality. EXAM: PORTABLE CHEST 1 VIEW COMPARISON:  Chest two views 02/08/2021 FINDINGS: Left chest wall cardiac pacer with leads overlying the right atrium and right ventricle. Cardiac silhouette and mediastinal contours are unchanged and within normal limits with moderate calcification again seen within the aortic arch. Surgical clips overlie the midline neck base. The lungs are clear. No pleural effusion or pneumothorax. Minimal levocurvature of the midthoracic spine. IMPRESSION: No acute cardiopulmonary disease process. Electronically Signed   By: Yvonne Kendall M.D.   On: 10/02/2021 19:13    EKG: My personal interpretation of EKG shows: paced rhythm, tachycardia     Assessment/Plan Principal Problem:   DKA (diabetic ketoacidosis) ([place]) Active Problems:   Acute cystitis   Acute metabolic encephalopathy   AKI (acute kidney injury) ([place])   Sepsis with acute renal failure without septic shock (HCC)   COPD  mixed type (HCC)   GERD (gastroesophageal reflux disease)   Pacemaker   Diabetic foot ulcers ([place]) - right foot    Assessment and Plan: * DKA (diabetic ketoacidosis) ([place]) Admit to progressive telemetry bed.  Inpatient.  DKA protocol.  IV insulin.  IV fluids.  BMP every 4 hours. DKA (diabetic ketoacidosis) ([place]) is a Acute illness/condition that poses a threat to life or bodily function.   Acute cystitis Acute.  Continue with IV Rocephin.  Awaiting blood and urine cultures.  Repeat CBC in the morning.  Sepsis with acute renal failure without septic shock (HCC) Sepsis due to DKA, acute cystitis.  Evidenced by acute kidney injury with a creatinine of 1.6.  Baseline 0.8.  Continue with IV fluids.  Repeat CMP in the morning.  AKI (acute kidney injury) ([place]) Continue with IV fluids.  Likely due to dehydration from DKA.  Also could be from acute cystitis.  Repeat BMP in the morning.  Acute metabolic encephalopathy Acute.  Likely due to combination of DKA, acute cystitis, dehydration, acute kidney injury.  Diabetic foot ulcers ([place]) - right foot Chronic.  Wound appears old.  Does not appear infected. We will have wound consult to the patient.  Pacemaker Chronic.  GERD (gastroesophageal reflux disease) Stable.  Continue Protonix 40 mg.  COPD mixed type ([place]) Stable.   DVT prophylaxis: SQ Heparin Code Status: Full Code by default. Pt not competent to make medical decisions at this time Family Communication: no family at bedside  Disposition Plan: return home vs placement  Consults called: none  Admission status: Inpatient,  progressive bed.   Kristopher Oppenheim, DO [facility] 10/02/2021, 9:32 PM

## 2021-10-02 NOTE — ED Triage Notes (Signed)
Pt BIB [facility] from home. Pt has a care giver that takes care of her ADLs and gives her meds. The caregivers mother has been sick so she has not come to the house for 4 days to take care of the patient. Pt is c/o being thirsty and worried about her cats. Pt denies any pain.    116 palpated BP 130 HR  CBG 330 20g left hand  500 NS

## 2021-10-03 DIAGNOSIS — E43 Unspecified severe protein-calorie malnutrition: Secondary | ICD-10-CM | POA: Insufficient documentation

## 2021-10-03 DIAGNOSIS — E1110 Type 2 diabetes mellitus with ketoacidosis without coma: Secondary | ICD-10-CM | POA: Diagnosis not present

## 2021-10-03 LAB — BASIC METABOLIC PANEL
Anion gap: 20 — ABNORMAL HIGH (ref 5–15)
Anion gap: 17 — ABNORMAL HIGH (ref 5–15)
Anion gap: 8 (ref 5–15)
BUN: 29 mg/dL — ABNORMAL HIGH (ref 8–23)
BUN: 28 mg/dL — ABNORMAL HIGH (ref 8–23)
BUN: 23 mg/dL (ref 8–23)
CO2: 14 mmol/L — ABNORMAL LOW (ref 22–32)
CO2: 17 mmol/L — ABNORMAL LOW (ref 22–32)
CO2: 26 mmol/L (ref 22–32)
Calcium: 8.9 mg/dL (ref 8.9–10.3)
Calcium: 9.2 mg/dL (ref 8.9–10.3)
Calcium: 8.8 mg/dL — ABNORMAL LOW (ref 8.9–10.3)
Chloride: 106 mmol/L (ref 98–111)
Chloride: 106 mmol/L (ref 98–111)
Chloride: 107 mmol/L (ref 98–111)
Creatinine, Ser: 1.44 mg/dL — ABNORMAL HIGH (ref 0.44–1.00)
Creatinine, Ser: 1.27 mg/dL — ABNORMAL HIGH (ref 0.44–1.00)
Creatinine, Ser: 0.92 mg/dL (ref 0.44–1.00)
GFR, Estimated: 36 mL/min — ABNORMAL LOW (ref >60)
GFR, Estimated: 42 mL/min — ABNORMAL LOW (ref >60)
GFR, Estimated: >60 mL/min (ref >60)
Glucose, Bld: 309 mg/dL — ABNORMAL HIGH (ref 70–99)
Glucose, Bld: 253 mg/dL — ABNORMAL HIGH (ref 70–99)
Glucose, Bld: 156 mg/dL — ABNORMAL HIGH (ref 70–99)
Potassium: 3.8 mmol/L (ref 3.5–5.1)
Potassium: 4.1 mmol/L (ref 3.5–5.1)
Potassium: 3.3 mmol/L — ABNORMAL LOW (ref 3.5–5.1)
Sodium: 140 mmol/L (ref 135–145)
Sodium: 140 mmol/L (ref 135–145)
Sodium: 141 mmol/L (ref 135–145)

## 2021-10-03 LAB — GLUCOSE, CAPILLARY
Glucose-Capillary: 111 mg/dL — ABNORMAL HIGH (ref 70–99)
Glucose-Capillary: 128 mg/dL — ABNORMAL HIGH (ref 70–99)
Glucose-Capillary: 166 mg/dL — ABNORMAL HIGH (ref 70–99)
Glucose-Capillary: 167 mg/dL — ABNORMAL HIGH (ref 70–99)
Glucose-Capillary: 180 mg/dL — ABNORMAL HIGH (ref 70–99)
Glucose-Capillary: 183 mg/dL — ABNORMAL HIGH (ref 70–99)
Glucose-Capillary: 201 mg/dL — ABNORMAL HIGH (ref 70–99)
Glucose-Capillary: 211 mg/dL — ABNORMAL HIGH (ref 70–99)
Glucose-Capillary: 225 mg/dL — ABNORMAL HIGH (ref 70–99)
Glucose-Capillary: 260 mg/dL — ABNORMAL HIGH (ref 70–99)
Glucose-Capillary: 282 mg/dL — ABNORMAL HIGH (ref 70–99)
Glucose-Capillary: 296 mg/dL — ABNORMAL HIGH (ref 70–99)

## 2021-10-03 LAB — CBC WITH DIFFERENTIAL/PLATELET
Abs Immature Granulocytes: 0.11 10*3/uL — ABNORMAL HIGH (ref 0.00–0.07)
Basophils Absolute: 0.1 10*3/uL (ref 0.0–0.1)
Basophils Relative: 0 %
Eosinophils Absolute: 0 10*3/uL (ref 0.0–0.5)
Eosinophils Relative: 0 %
HCT: 52.5 % — ABNORMAL HIGH (ref 36.0–46.0)
Hemoglobin: 17 g/dL — ABNORMAL HIGH (ref 12.0–15.0)
Immature Granulocytes: 1 %
Lymphocytes Relative: 22 %
Lymphs Abs: 2.6 10*3/uL (ref 0.7–4.0)
MCH: 28.9 pg (ref 26.0–34.0)
MCHC: 32.4 g/dL (ref 30.0–36.0)
MCV: 89.3 fL (ref 80.0–100.0)
Monocytes Absolute: 0.8 10*3/uL (ref 0.1–1.0)
Monocytes Relative: 7 %
Neutro Abs: 8.2 10*3/uL — ABNORMAL HIGH (ref 1.7–7.7)
Neutrophils Relative %: 70 %
Platelets: 188 10*3/uL (ref 150–400)
RBC: 5.88 MIL/uL — ABNORMAL HIGH (ref 3.87–5.11)
RDW: 14.5 % (ref 11.5–15.5)
WBC: 11.8 10*3/uL — ABNORMAL HIGH (ref 4.0–10.5)
nRBC: 0 % (ref 0.0–0.2)

## 2021-10-03 LAB — MAGNESIUM: Magnesium: 1.8 mg/dL (ref 1.7–2.4)

## 2021-10-03 MED ORDER — SODIUM CHLORIDE 0.9 % IV SOLN: INTRAVENOUS | Status: DC | PRN

## 2021-10-03 MED ORDER — INSULIN GLARGINE-YFGN 100 UNIT/ML ~~LOC~~ SOLN
30.0000 [IU] | Freq: Every day | SUBCUTANEOUS | Status: DC
  Administered 2021-10-04 – 2021-10-07 (×4): 30 [IU] via SUBCUTANEOUS
  Filled 2021-10-03 (×4): qty 0.3

## 2021-10-03 MED ORDER — POTASSIUM CHLORIDE CRYS ER 20 MEQ PO TBCR
40.0000 meq | EXTENDED_RELEASE_TABLET | Freq: Once | ORAL | Status: AC
  Administered 2021-10-03: 40 meq via ORAL
  Filled 2021-10-03: qty 2

## 2021-10-03 MED ORDER — INSULIN ASPART 100 UNIT/ML IJ SOLN
0.0000 [IU] | Freq: Every day | INTRAMUSCULAR | Status: DC
  Administered 2021-10-03 – 2021-10-06 (×2): 2 [IU] via SUBCUTANEOUS

## 2021-10-03 MED ORDER — INSULIN GLARGINE-YFGN 100 UNIT/ML ~~LOC~~ SOLN
20.0000 [IU] | Freq: Every day | SUBCUTANEOUS | Status: DC
  Administered 2021-10-03: 20 [IU] via SUBCUTANEOUS
  Filled 2021-10-03: qty 0.2

## 2021-10-03 MED ORDER — INSULIN ASPART 100 UNIT/ML IJ SOLN
0.0000 [IU] | Freq: Three times a day (TID) | INTRAMUSCULAR | Status: DC
  Administered 2021-10-03 – 2021-10-05 (×6): 2 [IU] via SUBCUTANEOUS
  Administered 2021-10-05: 3 [IU] via SUBCUTANEOUS
  Administered 2021-10-06: 5 [IU] via SUBCUTANEOUS
  Administered 2021-10-06: 1 [IU] via SUBCUTANEOUS
  Administered 2021-10-06 – 2021-10-07 (×2): 2 [IU] via SUBCUTANEOUS
  Administered 2021-10-07: 1 [IU] via SUBCUTANEOUS

## 2021-10-03 MED ORDER — ENSURE ENLIVE PO LIQD
237.0000 mL | Freq: Three times a day (TID) | ORAL | Status: DC
  Administered 2021-10-03 – 2021-10-04 (×4): 237 mL via ORAL

## 2021-10-03 MED ORDER — ADULT MULTIVITAMIN W/MINERALS CH
1.0000 | ORAL_TABLET | Freq: Every day | ORAL | Status: DC
  Administered 2021-10-03 – 2021-10-07 (×5): 1 via ORAL
  Filled 2021-10-03 (×5): qty 1

## 2021-10-03 NOTE — Consult Note (Signed)
[facility] Nurse Consult Note: Reason for Consult:Consult for wound on medial aspect of right foot. Full thickness. Wound type: full thickness, neuropathic vs arterial vs trauma Pressure Injury POA: N/A Measurement:To be measured today with first dressing change and documented on Nursing Flow Sheet by Bedside RN Wound bed:dry with dried serum Drainage (amount, consistency, odor) None Periwound: intact, dry. Hypertrophic toenails, hairless LEs, small areas of circular erythema on contralateral foot, medial aspect. Dressing procedure/placement/frequency: I have provided Nursing with conservative care guidance for the right LE wound and general PI prevention guidance for placement of bilateral pressure redistribution heel boots, sacral foam. Topical care will be to cleanse daily and follow with dressing using a folded antimicrobial nonadherent (xeroform) secured with silicone foam.   [facility] nursing team will not follow, but will remain available to this patient, the nursing and medical teams.  Please re-consult if needed. Thanks, Maudie Flakes, MSN, RN, [place], Arther Abbott  Pager# [phone]

## 2021-10-03 NOTE — Progress Notes (Signed)
   10/02/21 2319  Assess: MEWS Score  Temp 98.7 F (37.1 C)  BP (!) 152/99  Pulse Rate (!) 128  ECG Heart Rate (!) 128  Resp 16  Level of Consciousness Alert  SpO2 100 %  O2 Device Room Air  Assess: MEWS Score  MEWS Temp 0  MEWS Systolic 0  MEWS Pulse 2  MEWS RR 0  MEWS LOC 0  MEWS Score 2  MEWS Score Color Yellow  Assess: if the MEWS score is Yellow or Red  Were vital signs taken at a resting state? Yes  Focused Assessment No change from prior assessment  Early Detection of Sepsis Score *See Row Information* Medium  MEWS guidelines implemented *See Row Information* Yes  Take Vital Signs  Increase Vital Sign Frequency  Yellow: Q 2hr X 2 then Q 4hr X 2, if remains yellow, continue Q 4hrs  Escalate  MEWS: Escalate Yellow: discuss with charge nurse/RN and consider discussing with provider and RRT  Notify: Charge Nurse/RN  Name of Charge Nurse/RN Notified Glenna Durand RN  Date Charge Nurse/RN Notified 10/03/21  Time Charge Nurse/RN Notified 2319  Document  Patient Outcome Other (Comment) (pt remains on unit)  Progress note created (see row info) Yes

## 2021-10-03 NOTE — Progress Notes (Signed)
Patient noted to have poor po intake  Requires set up and takes small bites but spits food out stating it makes her feel sick.  Able to drink a small amount of ensure and milk with encouragement.

## 2021-10-03 NOTE — Progress Notes (Addendum)
PROGRESS NOTE  [name]  ZLD:[id number] DOB: Jan 28, 1936 DOA: 10/02/2021 PCP: Wardell Honour, MD   Brief Narrative:  Patient is a 86 year old female with history of diabetes type 2, COPD, GERD, stroke, status post pacemaker who lives alone, brought to the emergency department after she was found to be hypoglycemic, weak, inability to take care of herself.  As per the report, she is taken care by caregiver who did not come to take care of her for 4 days.  On presentation she was hemodynamically stable.  Lab work showed AKI with creatinine of 1.6, elevated anion gap, hyperglycemia, lactate of 2.2.  UA was suspicious for UTI.  Patient was started on insulin drip, IV fluids, antibiotics.  Admitted for the management of diabetes ketoacidosis.   Assessment & Plan:  Principal Problem:   DKA (diabetic ketoacidosis) ([place]) Active Problems:   Acute cystitis   Acute metabolic encephalopathy   AKI (acute kidney injury) ([place])   Sepsis with acute renal failure without septic shock (HCC)   COPD mixed type (HCC)   GERD (gastroesophageal reflux disease)   Pacemaker   Diabetic foot ulcers ([place]) - right foot   DKA/history of diabetes type 2: Takes insulin at home.  Was not given medications by caregiver for 4 days.  Presented with hyperglycemia, elevated anion gap.  Initially started on insulin drip.  Gap has closed.  Transition to long-acting and sliding scale.  Diabetic coordinator following.  Hemoglobin A1c pending  UTI: UA was suspicious for UTI.  Patient reported dysuria.  Culture pending.  Currently on ceftriaxone.  Presented with elevated lactic acid level, resolved.  Has mild leukocytosis.  AKI: Resolved with IV fluids  Acute metabolic encephalopathy: Likely due to combination of DKA, cystitis, dehydration, AKI.  Currently mental status at baseline  Hypertension: Takes Avapro, metoprolol  GERD: Continue PPI  History of coronary artery disease: Takes Plavix.  History of COPD:  Currently not in exacerbation.  Currently on room air.  Continue inhalers  Diabetic foot ulcers on right foot: Old wounds.  Chronic.  Do not appear infected.  Wound care following  Hypokalemia: Supplemented with potassium  Severe protein calorie malnutrition: Nutritionist following  Debility/deconditioning: We will consult PT/OT.  Lives alone at home.Very weak  Status post pacemaker placement.         Pressure Injury Ankle Right;Medial Stage 2 -  Partial thickness loss of dermis presenting as a shallow open injury with a red, pink wound bed without slough. (Active)     Location: Ankle  Location Orientation: Right;Medial  Staging: Stage 2 -  Partial thickness loss of dermis presenting as a shallow open injury with a red, pink wound bed without slough.  Wound Description (Comments):   Present on Admission: Yes  Dressing Type Non adherent;None 10/03/21 0700    DVT prophylaxis:heparin injection 5,000 Units Start: 10/03/21 0600 SCDs Start: 10/02/21 2337     Code Status: Full Code  Family Communication: None at bedside.  Patient says she does not have any family members  Patient status: Inpatient  Patient is from : Home  Anticipated discharge to: Skilled nursing facility  Estimated DC date: 1 to 2 days   Consultants: None  Procedures: None  Antimicrobials:  Anti-infectives (From admission, onward)    Start     Dose/Rate Route Frequency Ordered Stop   10/03/21 2000  ceFEPIme (MAXIPIME) 2 g in sodium chloride 0.9 % 100 mL IVPB  Status:  Discontinued        2 g 200 mL/hr  over 30 Minutes Intravenous Every 24 hours 10/02/21 2030 10/02/21 2336   10/03/21 0800  cefTRIAXone (ROCEPHIN) 2 g in sodium chloride 0.9 % 100 mL IVPB        2 g 200 mL/hr over 30 Minutes Intravenous Every 24 hours 10/02/21 2336     10/02/21 2030  vancomycin variable dose per unstable renal function (pharmacist dosing)  Status:  Discontinued         Does not apply See admin instructions 10/02/21  2030 10/02/21 2336   10/02/21 1845  ceFEPIme (MAXIPIME) 2 g in sodium chloride 0.9 % 100 mL IVPB        2 g 200 mL/hr over 30 Minutes Intravenous  Once 10/02/21 1835 10/02/21 2040   10/02/21 1845  metroNIDAZOLE (FLAGYL) IVPB 500 mg        500 mg 100 mL/hr over 60 Minutes Intravenous  Once 10/02/21 1835 10/02/21 2159   10/02/21 1845  vancomycin (VANCOCIN) IVPB 1000 mg/200 mL premix  Status:  Discontinued        1,000 mg 200 mL/hr over 60 Minutes Intravenous  Once 10/02/21 1835 10/02/21 1843   10/02/21 1845  vancomycin (VANCOREADY) IVPB 1250 mg/250 mL        1,250 mg 166.7 mL/hr over 90 Minutes Intravenous  Once 10/02/21 1843 10/02/21 2237       Subjective: Patient seen and examined at bedside this morning.  Hemodynamically stable during my evaluation, lying in bed, remains weak, looks very deconditioned.  Complains of some vague abdominal discomfort, reflux symptoms.  Objective: Vitals:   10/02/21 2319 10/03/21 0129 10/03/21 0435 10/03/21 0750  BP: (!) 152/99 (!) 113/54 (!) 96/44 (!) 129/55  Pulse: (!) 128 (!) 110 (!) 105 (!) 108  Resp: 16 17 16 18   Temp: 98.7 F (37.1 C) (!) 97.5 F (36.4 C) (!) 97.1 F (36.2 C) 98.3 F (36.8 C)  TempSrc: Oral Axillary Axillary Oral  SpO2: 100% 97% 96% 95%  Weight: 56.7 kg     Height: 5' 3"  (1.6 m)       Intake/Output Summary (Last 24 hours) at 10/03/2021 1242 Last data filed at 10/03/2021 0801 Gross per 24 hour  Intake 2200.65 ml  Output 300 ml  Net 1900.65 ml   Filed Weights   10/02/21 2319  Weight: 56.7 kg    Examination:  General exam: Overall comfortable, not in distress HEENT: PERRL Respiratory system:  no wheezes or crackles  Cardiovascular system: paced rhythm.S1 & S2 heard, RRR.  Gastrointestinal system: Abdomen is nondistended, soft and nontender. Central nervous system: Alert and oriented Extremities: No edema, no clubbing ,no cyanosis Skin: No rashes, no ulcers,no icterus     Data Reviewed: I have personally  reviewed following labs and imaging studies  CBC: Recent Labs  Lab 10/02/21 1835 10/02/21 2029 10/02/21 2030 10/03/21 0329  WBC 12.4*  --   --  11.8*  NEUTROABS 10.5*  --   --  8.2*  HGB 18.8* 18.7* 18.4* 17.0*  HCT 61.6* 55.0* 54.0* 52.5*  MCV 94.5  --   --  89.3  PLT 209  --   --  503   Basic Metabolic Panel: Recent Labs  Lab 10/02/21 1835 10/02/21 2029 10/02/21 2030 10/03/21 0059 10/03/21 0329 10/03/21 0731  NA 140 139 139 140 140 141  K 4.8 4.9 4.9 3.8 4.1 3.3*  CL 99 105  --  106 106 107  CO2 10*  --   --  14* 17* 26  GLUCOSE 468* 463*  --  309* 253* 156*  BUN 30* 35*  --  29* 28* 23  CREATININE 1.63* 0.70  --  1.44* 1.27* 0.92  CALCIUM 10.1  --   --  8.9 9.2 8.8*  MG  --   --   --   --  1.8  --      Recent Results (from the past 240 hour(s))  Blood Culture (routine x 2)     Status: None (Preliminary result)   Collection Time: 10/02/21  6:35 PM   Specimen: BLOOD LEFT FOREARM  Result Value Ref Range Status   Specimen Description BLOOD LEFT FOREARM  Final   Special Requests   Final    BOTTLES DRAWN AEROBIC AND ANAEROBIC Blood Culture adequate volume   Culture   Final    NO GROWTH < 12 HOURS Performed at [facility], [address], [place], [place] [address]    Report Status PENDING  Incomplete  Blood Culture (routine x 2)     Status: None (Preliminary result)   Collection Time: 10/02/21  6:40 PM   Specimen: BLOOD RIGHT WRIST  Result Value Ref Range Status   Specimen Description BLOOD RIGHT WRIST  Final   Special Requests   Final    BOTTLES DRAWN AEROBIC AND ANAEROBIC Blood Culture adequate volume   Culture   Final    NO GROWTH < 12 HOURS Performed at [facility], [place] [address] [place], [place] [address]    Report Status PENDING  Incomplete  Resp Panel by RT-PCR (Flu A&B, Covid) Anterior Nasal Swab     Status: None   Collection Time: 10/02/21  8:20 PM   Specimen: Anterior Nasal Swab  Result Value Ref Range Status   SARS Coronavirus  2 by RT PCR NEGATIVE NEGATIVE Final    Comment: (NOTE) SARS-CoV-2 target nucleic acids are NOT DETECTED.  The SARS-CoV-2 RNA is generally detectable in upper respiratory specimens during the acute phase of infection. The lowest concentration of SARS-CoV-2 viral copies this assay can detect is 138 copies/mL. A negative result does not preclude SARS-Cov-2 infection and should not be used as the sole basis for treatment or other patient management decisions. A negative result may occur with  improper specimen collection/handling, submission of specimen other than nasopharyngeal swab, presence of viral mutation(s) within the areas targeted by this assay, and inadequate number of viral copies(<138 copies/mL). A negative result must be combined with clinical observations, patient history, and epidemiological information. The expected result is Negative.  Fact Sheet for Patients:  EntrepreneurPulse.com.au  Fact Sheet for Healthcare Providers:  IncredibleEmployment.be  This test is no t yet approved or cleared by the [place] FDA and  has been authorized for detection and/or diagnosis of SARS-CoV-2 by FDA under an Emergency Use Authorization (EUA). This EUA will remain  in effect (meaning this test can be used) for the duration of the COVID-19 declaration under Section 564(b)(1) of the Act, 21 U.S.C.section 360bbb-3(b)(1), unless the authorization is terminated  or revoked sooner.       Influenza A by PCR NEGATIVE NEGATIVE Final   Influenza B by PCR NEGATIVE NEGATIVE Final    Comment: (NOTE) The Xpert Xpress SARS-CoV-2/FLU/RSV plus assay is intended as an aid in the diagnosis of influenza from Nasopharyngeal swab specimens and should not be used as a sole basis for treatment. Nasal washings and aspirates are unacceptable for Xpert Xpress SARS-CoV-2/FLU/RSV testing.  Fact Sheet for Patients: EntrepreneurPulse.com.au  Fact  Sheet for Healthcare Providers: IncredibleEmployment.be  This  test is not yet approved or cleared by the [place] and has been authorized for detection and/or diagnosis of SARS-CoV-2 by FDA under an Emergency Use Authorization (EUA). This EUA will remain in effect (meaning this test can be used) for the duration of the COVID-19 declaration under Section 564(b)(1) of the Act, 21 U.S.C. section 360bbb-3(b)(1), unless the authorization is terminated or revoked.  Performed at [facility], [place] [address], [place], [place] [address]      Radiology Studies: DG Chest Port 1 View  Result Date: 10/02/2021 CLINICAL DATA:  Questionable sepsis.  Evaluate for abnormality. EXAM: PORTABLE CHEST 1 VIEW COMPARISON:  Chest two views 02/08/2021 FINDINGS: Left chest wall cardiac pacer with leads overlying the right atrium and right ventricle. Cardiac silhouette and mediastinal contours are unchanged and within normal limits with moderate calcification again seen within the aortic arch. Surgical clips overlie the midline neck base. The lungs are clear. No pleural effusion or pneumothorax. Minimal levocurvature of the midthoracic spine. IMPRESSION: No acute cardiopulmonary disease process. Electronically Signed   By: Yvonne Kendall M.D.   On: 10/02/2021 19:13    Scheduled Meds:  heparin  5,000 Units Subcutaneous Q8H   insulin aspart  0-5 Units Subcutaneous QHS   insulin aspart  0-9 Units Subcutaneous TID WC   [START ON 10/04/2021] insulin glargine-yfgn  30 Units Subcutaneous Daily   pantoprazole  40 mg Oral Daily   Continuous Infusions:  sodium chloride 10 mL/hr at 10/03/21 0936   cefTRIAXone (ROCEPHIN)  IV 2 g (10/03/21 8675)   lactated ringers 100 mL/hr at 10/03/21 0931     LOS: 1 day   Shelly Coss, MD [facility] P5/29/2023, 12:42 PM

## 2021-10-03 NOTE — Progress Notes (Signed)
Inpatient Diabetes Program Recommendations  AACE/ADA: New Consensus Statement on Inpatient Glycemic Control (2015)  Target Ranges:  Prepandial:   less than 140 mg/dL      Peak postprandial:   less than 180 mg/dL (1-2 hours)      Critically ill patients:  140 - 180 mg/dL   Lab Results  Component Value Date   GLUCAP 166 (H) 10/03/2021   HGBA1C 15.0 (H) 08/09/2021    Review of Glycemic Control  Diabetes history: DM 2 Outpatient Diabetes medications: Tresiba 36 units, Novolog 12-14 units tid Current orders for Inpatient glycemic control:  IV insulin  Inpatient Diabetes Program Recommendations:    IV insulin currently at 4 units/hour  At time of transition consider: -   Consider Semglee 30 units -   Novolog 0-15 units tid + hs  Thanks,  Tama Headings RN, MSN, BC-ADM Inpatient Diabetes Coordinator Team Pager [phone] (8a-5p)

## 2021-10-03 NOTE — Evaluation (Signed)
Physical Therapy Evaluation Patient Details Name: [name] MRN: [id number] DOB: [birth year] Today's Date: 10/03/2021  History of Present Illness  86 y.o. female presenting to the ED with AMS on 10/02/21. Admitted with AMS and DKA on 10/03/21. Has a caretaker that has been unable to provide care or medications for 4 days. Medical history significant of anxiety; COPD on home O2; DM; Glaucoma, spinal stenosis with neurogenic claudication, HTN; HLD; BPPV, Sjogren syndrome, pacemaker placement; dementia; and NASH  Clinical Impression  Pt admitted secondary to problem above with deficits below. Pt requiring mod A +2 for bed mobility and transfers this session. Reporting increased fatigue and nausea so mobility limited. Pt also presenting with impaired cognition and anticipate she will have difficulty caring for herself at home. Recommending SNF level therapies to increase independence and safety. Will continue to follow acutely.        Recommendations for follow up therapy are one component of a multi-disciplinary discharge planning process, led by the attending physician.  Recommendations may be updated based on patient status, additional functional criteria and insurance authorization.  Follow Up Recommendations Skilled nursing-short term rehab (<3 hours/day)    Assistance Recommended at Discharge Frequent or constant Supervision/Assistance  Patient can return home with the following  A lot of help with walking and/or transfers;A lot of help with bathing/dressing/bathroom;Assistance with cooking/housework;Help with stairs or ramp for entrance;Assist for transportation;Direct supervision/assist for financial management;Direct supervision/assist for medications management    Equipment Recommendations Other (comment) (TBD)  Recommendations for Other Services       Functional Status Assessment Patient has had a recent decline in their functional status and demonstrates the ability to make  significant improvements in function in a reasonable and predictable amount of time.     Precautions / Restrictions Precautions Precautions: Fall Restrictions Weight Bearing Restrictions: No      Mobility  Bed Mobility Overal bed mobility: Needs Assistance Bed Mobility: Supine to Sit     Supine to sit: Mod assist, +2 for physical assistance     General bed mobility comments: Asssist for trunk and LE assist. increased time required    Transfers Overall transfer level: Needs assistance Equipment used: 2 person hand held assist Transfers: Sit to/from Stand, Bed to chair/wheelchair/BSC Sit to Stand: Mod assist, +2 physical assistance Stand pivot transfers: Mod assist, +2 physical assistance         General transfer comment: Mod A +2 for lift assist and steadying throughout transfer to chair. Very short, cautious steps to chair.    Ambulation/Gait                  Stairs            Wheelchair Mobility    Modified Rankin (Stroke Patients Only)       Balance Overall balance assessment: Needs assistance Sitting-balance support: No upper extremity supported Sitting balance-Leahy Scale: Fair     Standing balance support: Bilateral upper extremity supported Standing balance-Leahy Scale: Poor Standing balance comment: Reliant on UE and external support                             Pertinent Vitals/Pain Pain Assessment Pain Assessment: Faces Faces Pain Scale: Hurts even more Pain Location: back Pain Descriptors / Indicators: Grimacing, Guarding Pain Intervention(s): Limited activity within patient's tolerance, Monitored during session, Repositioned    Home Living Family/patient expects to be discharged to:: Private residence Living Arrangements: Alone Available Help at Discharge:  Personal care attendant;Available PRN/intermittently (caretaker comes 3-4 days/week; from 9-3) Type of Home: Apartment Home Access: Stairs to enter Entrance  Stairs-Rails: Can reach both Entrance Stairs-Number of Steps: 2   Home Layout: One level Home Equipment: Conservation officer, nature (2 wheels);Shower seat      Prior Function Prior Level of Function : Needs assist             Mobility Comments: Uses RW for ambulation ADLs Comments: Caretaker assists with cleaning. Reports independence with ADL tasks, but unsure of accuracy     Hand Dominance        Extremity/Trunk Assessment   Upper Extremity Assessment Upper Extremity Assessment: Defer to OT evaluation    Lower Extremity Assessment Lower Extremity Assessment: Generalized weakness    Cervical / Trunk Assessment Cervical / Trunk Assessment: Kyphotic  Communication   Communication: No difficulties  Cognition Arousal/Alertness: Awake/alert Behavior During Therapy: WFL for tasks assessed/performed Overall Cognitive Status: No family/caregiver present to determine baseline cognitive functioning                                 General Comments: Memory deficits noted, reports she is unsure of when she took her meds. Conflicting information reported throughout about PLOF.        General Comments      Exercises     Assessment/Plan    PT Assessment Patient needs continued PT services  PT Problem List Decreased strength;Decreased activity tolerance;Decreased balance;Decreased mobility;Decreased cognition;Decreased knowledge of use of DME;Decreased safety awareness;Decreased knowledge of precautions       PT Treatment Interventions DME instruction;Gait training;Functional mobility training;Therapeutic activities;Therapeutic exercise;Balance training;Patient/family education    PT Goals (Current goals can be found in the Care Plan section)  Acute Rehab PT Goals Patient Stated Goal: to feel better PT Goal Formulation: With patient Time For Goal Achievement: 10/17/21 Potential to Achieve Goals: Good    Frequency Min 2X/week     Co-evaluation PT/OT/SLP  Co-Evaluation/Treatment: Yes Reason for Co-Treatment: To address functional/ADL transfers;For patient/therapist safety PT goals addressed during session: Mobility/safety with mobility;Balance         AM-PAC PT "6 Clicks" Mobility  Outcome Measure Help needed turning from your back to your side while in a flat bed without using bedrails?: A Lot Help needed moving from lying on your back to sitting on the side of a flat bed without using bedrails?: Total Help needed moving to and from a bed to a chair (including a wheelchair)?: Total Help needed standing up from a chair using your arms (e.g., wheelchair or bedside chair)?: Total Help needed to walk in hospital room?: Total Help needed climbing 3-5 steps with a railing? : Total 6 Click Score: 7    End of Session Equipment Utilized During Treatment: Gait belt Activity Tolerance: Patient limited by fatigue Patient left: in chair;with call bell/phone within reach;with chair alarm set Nurse Communication: Mobility status PT Visit Diagnosis: Other abnormalities of gait and mobility (R26.89);Unsteadiness on feet (R26.81);Muscle weakness (generalized) (M62.81)    Time: 5361-4431 PT Time Calculation (min) (ACUTE ONLY): 25 min   Charges:   PT Evaluation $PT Eval Moderate Complexity: 1 Mod          Reuel Derby, PT, DPT  Acute Rehabilitation Services  Office: [phone]   Rudean Hitt 10/03/2021, 2:41 PM

## 2021-10-03 NOTE — Progress Notes (Signed)
Initial Nutrition Assessment  DOCUMENTATION CODES:   Severe malnutrition in context of chronic illness  INTERVENTION:   - Ensure Enlive po TID, each supplement provides 350 kcal and 20 grams of protein  - MVI with minerals daily  - Encourage PO intake  NUTRITION DIAGNOSIS:   Severe Malnutrition related to chronic illness (COPD, stroke) as evidenced by severe fat depletion, severe muscle depletion, percent weight loss (19.8% weight loss in 4 months).  GOAL:   Patient will meet greater than or equal to 90% of their needs  MONITOR:   PO intake, Supplement acceptance, Labs, Weight trends, Skin  REASON FOR ASSESSMENT:   Malnutrition Screening Tool    ASSESSMENT:   86 year old female who presented to the ED on 5/28 with AMS. PMH of COPD, stroke, T2DM, MI, GERD, diabetic foot ulcers. Pt admitted with DKA, sepsis, acute cystitis, AKI.  Diet advanced from NPO to Carb Modified this morning. Pt has transitioned off insulin drip.  Per notes, pt lives alone and has a caregiver who has not been to her house in 4 days.  Spoke with pt at bedside. Pt with untouched breakfast meal tray at bedside. RD and RN assisted pt in getting set up for breakfast meal. Pt reporting nausea and worried she is going to vomit but does want to try to eat something. RN able to administer PRN IV zofran. Pt does not think that she will be able to eat much of her breakfast. She wants to drink some coffee. Pt did drink water during RD visit and complained of dry mouth and dry lips.  Pt reports having not eaten anything for a few days. She is unable to elaborate on this. Pt reports poor PO intake at home and weight loss that has been ongoing for "a long time." Pt reports that at one point she weighed around 200 lbs. She states that she started gaining weight when she started taking insulin. Pt reports losing weight over time to current weight of 125 lbs. Reviewed weight history in chart. Weight on admission appears  stated rather than measured. If accurate, pt has lost 14 kg since 05/31/21. This is a 19.8% weight loss in 4 months which is severe and significant for timeframe. Pt meets criteria for severe malnutrition.  RD offered to order oral nutrition supplements but pt declined due to nausea. Will order supplements for later given severe malnutrition and poor PO intake. Pt would benefit from nutrient dense supplement. One Ensure Enlive supplement provides 350 kcals, 20 grams protein, and 44-45 grams of carbohydrate vs one Glucerna supplement which provides 220 kcals, 10 grams of protein, and 26 grams of carbohydrate. Given pt's hx of DM, RD will continue to monitor PO intake and CBG's and adjust supplement regimen as appropriate. Will also order daily MVI with minerals.  RD encouraged pt to eat what she can from meal trays. Discussed importance of adequate nutrition in preventing additional weight loss and promoting healing.  Medications reviewed and include: SSI, semglee 20 units daily, protonix, IV abx IVF: LR @ 100 ml/hr  Labs reviewed: potassium 3.3, WBC 11.8, hemoglobin A1C 15.0 on 08/09/21 CBG's: 111-370 x 12 hours (trending down)  NUTRITION - FOCUSED PHYSICAL EXAM:  Flowsheet Row Most Recent Value  Orbital Region Severe depletion  Upper Arm Region Moderate depletion  Thoracic and Lumbar Region Severe depletion  Buccal Region Moderate depletion  Temple Region Moderate depletion  Clavicle Bone Region Severe depletion  Clavicle and Acromion Bone Region Severe depletion  Scapular Bone Region Moderate  depletion  Dorsal Hand Moderate depletion  Patellar Region Moderate depletion  Anterior Thigh Region Severe depletion  Posterior Calf Region Moderate depletion  Edema (RD Assessment) None  Hair Reviewed  Eyes Reviewed  Mouth Reviewed  Skin Reviewed  Nails Reviewed       Diet Order:   Diet Order             Diet Carb Modified Fluid consistency: Thin; Room service appropriate? No  Diet  effective now                   EDUCATION NEEDS:   Education needs have been addressed  Skin:  Skin Assessment: Skin Integrity Issues: Stage II: R ankle  Last BM:  no documented BM  Height:   Ht Readings from Last 1 Encounters:  10/02/21 5' 3"  (1.6 m)    Weight:   Wt Readings from Last 1 Encounters:  10/02/21 56.7 kg    BMI:  Body mass index is 22.14 kg/m.  Estimated Nutritional Needs:   Kcal:  1450-1650  Protein:  65-80 grams  Fluid:  1.4-1.6 L    Gustavus Bryant, MS, RD, LDN Inpatient Clinical Dietitian Please see AMiON for contact information.

## 2021-10-03 NOTE — Progress Notes (Addendum)
Patient has an aide named Louie Casa that comes out to help her weekly.Louie Casa # [phone] with [facility]. CSW spoke with Louie Casa who confirmed. Louie Casa reports patient comes from home alone.CSW informed CM. CSW will continue to follow.

## 2021-10-03 NOTE — Evaluation (Signed)
Occupational Therapy Evaluation Patient Details Name: [name] MRN: [id number] DOB: 1935-07-29 Today's Date: 10/03/2021   History of Present Illness 86 y.o. female presenting to the ED with AMS on 10/02/21. Admitted with AMS and DKA on 10/03/21. Has a caretaker that has been unable to provide care or medications for 4 days. Medical history significant of anxiety; COPD on home O2; DM; Glaucoma, spinal stenosis with neurogenic claudication, HTN; HLD; BPPV, Sjogren syndrome, pacemaker placement; dementia; and NASH   Clinical Impression   Prior to this admission, patient living alone with "caretaker" assisting her. Patient reports independence with ADLs, however then states that she "probably havent taken my medication since my caretaker hasnt been over to help me". Currently, patient presenting with decreased activity tolerance, impaired cognition, weakness, and difficulty following basic commands. Patient unable to state who called 911 or how she got to the hospital, and inconsistent report of status and home situation throughout. Patient mod A for ADLs, and mod A of 2 for transfer to recliner with HHA with PT. Patient noted to have HR up to 122 with basic mobility. OT recommending SNF level rehab due to current level of deficits, OT will continue to follow acutely.      Recommendations for follow up therapy are one component of a multi-disciplinary discharge planning process, led by the attending physician.  Recommendations may be updated based on patient status, additional functional criteria and insurance authorization.   Follow Up Recommendations  Skilled nursing-short term rehab (<3 hours/day)    Assistance Recommended at Discharge Frequent or constant Supervision/Assistance  Patient can return home with the following Two people to help with walking and/or transfers;A lot of help with bathing/dressing/bathroom;Assistance with cooking/housework;Assistance with feeding;Direct  supervision/assist for medications management;Direct supervision/assist for financial management;Assist for transportation;Help with stairs or ramp for entrance    Functional Status Assessment  Patient has had a recent decline in their functional status and demonstrates the ability to make significant improvements in function in a reasonable and predictable amount of time.  Equipment Recommendations  Other (comment) (Defer to next venue)    Recommendations for Other Services       Precautions / Restrictions Precautions Precautions: Fall Restrictions Weight Bearing Restrictions: No      Mobility Bed Mobility Overal bed mobility: Needs Assistance Bed Mobility: Supine to Sit     Supine to sit: Mod assist, +2 for physical assistance     General bed mobility comments: Asssist for trunk and LE assist. increased time required    Transfers Overall transfer level: Needs assistance Equipment used: 2 person hand held assist Transfers: Sit to/from Stand, Bed to chair/wheelchair/BSC Sit to Stand: Mod assist, +2 physical assistance Stand pivot transfers: Mod assist, +2 physical assistance         General transfer comment: Mod A +2 for lift assist and steadying throughout transfer to chair. Very short, cautious steps to chair.      Balance Overall balance assessment: Needs assistance Sitting-balance support: No upper extremity supported Sitting balance-Leahy Scale: Fair     Standing balance support: Bilateral upper extremity supported Standing balance-Leahy Scale: Poor Standing balance comment: Reliant on UE and external support                           ADL either performed or assessed with clinical judgement   ADL Overall ADL's : Needs assistance/impaired Eating/Feeding: Set up;Sitting   Grooming: Minimal assistance;Sitting   Upper Body Bathing: Minimal assistance;Moderate assistance;Sitting  Lower Body Bathing: Moderate assistance;Maximal  assistance;Sitting/lateral leans;Sit to/from stand   Upper Body Dressing : Minimal assistance;Moderate assistance;Sitting   Lower Body Dressing: Moderate assistance;Maximal assistance;Sitting/lateral leans;Sit to/from stand   Toilet Transfer: Moderate assistance;+2 for physical assistance;+2 for safety/equipment;Cueing for safety;Cueing for sequencing;Ambulation Toilet Transfer Details (indicate cue type and reason): 2 person HHA to recliner, significant posterior lean and shuffled gait Toileting- Clothing Manipulation and Hygiene: Moderate assistance;Maximal assistance;Sit to/from stand;Sitting/lateral lean       Functional mobility during ADLs: Moderate assistance;Maximal assistance;Cueing for safety;Cueing for sequencing General ADL Comments: Patient presenting with decreased activity tolerance, impaired cognition, weakness, and difficulty following basic commands.     Vision Baseline Vision/History: 1 Wears glasses Ability to See in Adequate Light: 0 Adequate Patient Visual Report: No change from baseline Additional Comments: will continue to assess, difficutly following commands in session     Perception     Praxis      Pertinent Vitals/Pain Pain Assessment Pain Assessment: Faces Faces Pain Scale: Hurts even more Pain Location: back Pain Descriptors / Indicators: Grimacing, Guarding     Hand Dominance     Extremity/Trunk Assessment Upper Extremity Assessment Upper Extremity Assessment: Generalized weakness   Lower Extremity Assessment Lower Extremity Assessment: Defer to PT evaluation   Cervical / Trunk Assessment Cervical / Trunk Assessment: Kyphotic   Communication Communication Communication: No difficulties   Cognition Arousal/Alertness: Awake/alert Behavior During Therapy: WFL for tasks assessed/performed Overall Cognitive Status: No family/caregiver present to determine baseline cognitive functioning                                  General Comments: Memory deficits noted, reports she is unsure of when she took her meds. Conflicting information reported throughout about PLOF.     General Comments       Exercises     Shoulder Instructions      Home Living Family/patient expects to be discharged to:: Private residence Living Arrangements: Alone Available Help at Discharge: Personal care attendant;Available PRN/intermittently (caretaker comes 3-4 days/week; from 9-3) Type of Home: Apartment Home Access: Stairs to enter Entrance Stairs-Number of Steps: 2 Entrance Stairs-Rails: Can reach both Home Layout: One level     Bathroom Shower/Tub: Teacher, early years/pre: Standard     Home Equipment: Conservation officer, nature (2 wheels);Shower seat          Prior Functioning/Environment Prior Level of Function : Needs assist             Mobility Comments: Uses RW for ambulation ADLs Comments: Caretaker assists with cleaning. Reports independence with ADL tasks, but unsure of accuracy        OT Problem List: Decreased strength;Decreased range of motion;Decreased activity tolerance;Impaired balance (sitting and/or standing);Decreased coordination;Decreased cognition;Decreased safety awareness;Decreased knowledge of use of DME or AE;Decreased knowledge of precautions;Cardiopulmonary status limiting activity      OT Treatment/Interventions: Self-care/ADL training;Therapeutic exercise;Energy conservation;DME and/or AE instruction;Manual therapy;Therapeutic activities;Cognitive remediation/compensation;Patient/family education;Balance training    OT Goals(Current goals can be found in the care plan section) Acute Rehab OT Goals Patient Stated Goal: check on my cat Sassy OT Goal Formulation: Patient unable to participate in goal setting Time For Goal Achievement: 10/17/21 Potential to Achieve Goals: Fair ADL Goals Pt Will Perform Lower Body Bathing: with min assist;sit to/from stand;sitting/lateral  leans;with adaptive equipment Pt Will Perform Lower Body Dressing: with min assist;sitting/lateral leans;sit to/from stand;with adaptive equipment Pt Will Transfer to Toilet: with min assist;ambulating  Pt/caregiver will Perform Home Exercise Program: Increased strength;Both right and left upper extremity;With written HEP provided;With minimal assist Additional ADL Goal #1: Patient will demonstrate increased cognition to be able to follow 1-2 step commands consistently. Additional ADL Goal #2: Patient will demonstrate increased activity tolerance to complete functional task in standing for 2-4 minutes to increase overall independence with ADLs.  OT Frequency: Min 2X/week    Co-evaluation PT/OT/SLP Co-Evaluation/Treatment: Yes Reason for Co-Treatment: Complexity of the patient's impairments (multi-system involvement);Necessary to address cognition/behavior during functional activity;For patient/therapist safety;To address functional/ADL transfers PT goals addressed during session: Mobility/safety with mobility;Balance OT goals addressed during session: ADL's and self-care;Proper use of Adaptive equipment and DME      AM-PAC OT "6 Clicks" Daily Activity     Outcome Measure Help from another person eating meals?: A Little Help from another person taking care of personal grooming?: A Little Help from another person toileting, which includes using toliet, bedpan, or urinal?: A Lot Help from another person bathing (including washing, rinsing, drying)?: A Lot Help from another person to put on and taking off regular upper body clothing?: A Little Help from another person to put on and taking off regular lower body clothing?: A Lot 6 Click Score: 15   End of Session Equipment Utilized During Treatment: Gait belt Nurse Communication: Mobility status  Activity Tolerance: Patient limited by lethargy Patient left: in chair;with call bell/phone within reach;with chair alarm set  OT Visit Diagnosis:  Unsteadiness on feet (R26.81);Other abnormalities of gait and mobility (R26.89);Repeated falls (R29.6);Muscle weakness (generalized) (M62.81);History of falling (Z91.81);Other symptoms and signs involving cognitive function;Adult, failure to thrive (R62.7)                Time: 1102-1117 OT Time Calculation (min): 24 min Charges:  OT General Charges $OT Visit: 1 Visit OT Evaluation $OT Eval Moderate Complexity: 1 Mod  Corinne Ports E. Shonte Soderlund, OTR/L Acute Rehabilitation Services [phone] [place] 10/03/2021, 3:40 PM

## 2021-10-04 DIAGNOSIS — E1110 Type 2 diabetes mellitus with ketoacidosis without coma: Secondary | ICD-10-CM | POA: Diagnosis not present

## 2021-10-04 LAB — URINE CULTURE: Culture: >=100000 — AB

## 2021-10-04 LAB — BASIC METABOLIC PANEL
Anion gap: 4 — ABNORMAL LOW (ref 5–15)
BUN: 12 mg/dL (ref 8–23)
CO2: 29 mmol/L (ref 22–32)
Calcium: 8.6 mg/dL — ABNORMAL LOW (ref 8.9–10.3)
Chloride: 105 mmol/L (ref 98–111)
Creatinine, Ser: 0.77 mg/dL (ref 0.44–1.00)
GFR, Estimated: >60 mL/min (ref >60)
Glucose, Bld: 185 mg/dL — ABNORMAL HIGH (ref 70–99)
Potassium: 3.8 mmol/L (ref 3.5–5.1)
Sodium: 138 mmol/L (ref 135–145)

## 2021-10-04 LAB — HEMOGLOBIN A1C
Hgb A1c MFr Bld: >15.5 % — ABNORMAL HIGH (ref 4.8–5.6)
Mean Plasma Glucose: >398 mg/dL

## 2021-10-04 LAB — CBC
HCT: 46.1 % — ABNORMAL HIGH (ref 36.0–46.0)
Hemoglobin: 14.7 g/dL (ref 12.0–15.0)
MCH: 28.3 pg (ref 26.0–34.0)
MCHC: 31.9 g/dL (ref 30.0–36.0)
MCV: 88.8 fL (ref 80.0–100.0)
Platelets: 135 10*3/uL — ABNORMAL LOW (ref 150–400)
RBC: 5.19 MIL/uL — ABNORMAL HIGH (ref 3.87–5.11)
RDW: 14.7 % (ref 11.5–15.5)
WBC: 6.3 10*3/uL (ref 4.0–10.5)
nRBC: 0 % (ref 0.0–0.2)

## 2021-10-04 LAB — GLUCOSE, CAPILLARY
Glucose-Capillary: 158 mg/dL — ABNORMAL HIGH (ref 70–99)
Glucose-Capillary: 166 mg/dL — ABNORMAL HIGH (ref 70–99)
Glucose-Capillary: 175 mg/dL — ABNORMAL HIGH (ref 70–99)
Glucose-Capillary: 148 mg/dL — ABNORMAL HIGH (ref 70–99)

## 2021-10-04 MED ORDER — SODIUM CHLORIDE 0.9 % IV SOLN: INTRAVENOUS | Status: DC

## 2021-10-04 NOTE — NC FL2 (Addendum)
[place] LEVEL OF CARE SCREENING TOOL     IDENTIFICATION  Patient Name: [name] Birthdate: 14-Nov-1935 Sex: female Admission Date (Current Location): 10/02/2021  [facility] and [place] Number:  Herbalist and Address:  [facility] [place] [place] [address] [place], [place] [address]      Provider Number: [id number]  Attending Physician Name and Address:  Shelly Coss, MD  Relative Name and Phone Number:       Current Level of Care: Hospital Recommended Level of Care: [place] Prior Approval Number:    Date Approved/Denied:   PASRR Number: [id number]  Discharge Plan: SNF    Current Diagnoses: Patient Active Problem List   Diagnosis Date Noted   Protein-calorie malnutrition, severe 10/03/2021   DKA (diabetic ketoacidosis) ([place]) 10/02/2021   AKI (acute kidney injury) ([place]) 10/02/2021   Acute cystitis 10/02/2021   Diabetic foot ulcers ([place]) - right foot 10/02/2021   Sepsis with acute renal failure without septic shock ([place]) 10/02/2021   Seborrheic dermatitis 10/12/2020   Pain due to onychomycosis of toenails of both feet 09/01/2020   Sjogren's syndrome with keratoconjunctivitis sicca ([place]) 06/07/2020   Mild cognitive impairment with memory loss 01/29/2020   Full code status 01/26/2020   Pacemaker 01/19/2020   Pressure injury of skin 01/14/2020   Complete heart block (HCC)    Acute metabolic encephalopathy    Acute on chronic respiratory failure with hypoxia ([place])    Scoliosis due to degenerative disease of spine in adult patient 09/10/2018   Radicular pain of left lower extremity 09/10/2018   Hand arthritis 10/11/2017   Type 2 diabetes mellitus ([place]) 03/08/2017   Diarrhea in adult patient 11/08/2016   Thrombocytopenia ([place]) 11/08/2016   Degenerative lumbar spinal stenosis 10/27/2015   Arthralgia 09/22/2015   Chronic respiratory failure with hypoxia ([place]) 01/11/2015   Diabetic polyneuropathy  associated with type 2 diabetes mellitus ([place]) 11/16/2014   Hypertension 05/19/2013   Vertigo 05/11/2013   Dizziness 05/11/2013   Hyperlipidemia 02/15/2013   Type II diabetes mellitus, uncontrolled 01/01/2013   Benign paroxysmal positional vertigo 08/09/2012   Type II or unspecified type diabetes mellitus with neurological manifestations, uncontrolled(250.62) 08/09/2012   GLAUCOMA 03/16/2010   HOARSENESS 03/16/2010   COUGH 12/23/2009   MUSCULOSKELETAL PAIN 01/28/2009   COPD mixed type ([place]) 01/15/2009   Seasonal and perennial allergic rhinitis 11/23/2007   CHRONIC RHINITIS 07/16/2007   GERD (gastroesophageal reflux disease) 05/29/2007   I B S-DIARRHEAL PREDOMINATE 05/29/2007   FATTY LIVER DISEASE 05/29/2007    Orientation RESPIRATION BLADDER Height & Weight     Self  Normal External catheter, Incontinent (External Urinary Catheter) Weight: 125 lb (56.7 kg) Height:  5' 3"  (160 cm)  BEHAVIORAL SYMPTOMS/MOOD NEUROLOGICAL BOWEL NUTRITION STATUS       (WDL) Diet (Please see discharge summary)  AMBULATORY STATUS COMMUNICATION OF NEEDS Skin   Extensive Assist Verbally Other (Comment) (mottled,dry,PI ankle,R,medial stage 2, non adherent,clean,dry,intact,changed daily)                       Personal Care Assistance Level of Assistance  Bathing, Feeding, Dressing Bathing Assistance: Limited assistance Feeding assistance: Limited assistance (Needs set up) Dressing Assistance: Limited assistance     Functional Limitations Info  Sight, Hearing, Speech Sight Info: Adequate Hearing Info: Adequate Speech Info: Adequate    SPECIAL CARE FACTORS FREQUENCY  PT (By licensed PT), OT (By licensed OT)     PT Frequency: 5x min weekly OT  Frequency: 5x min weekly            Contractures Contractures Info: Not present    Additional Factors Info  Code Status, Allergies, Insulin Sliding Scale Code Status Info: FULL Allergies Info: Chlordiazepoxide-clidinium,Biaxin  (clarithromycin),Tramadol,Codeine   Insulin Sliding Scale Info: insulin aspart (novoLOG) injection 0-5 Units daily at bedtime,insulin aspart (novoLOG) injection 0-9 Units 3 times daily with meals,insulin glargine-yfgn [facility]) injection 30 Units daily       Current Medications (10/04/2021):  This is the current hospital active medication list Current Facility-Administered Medications  Medication Dose Route Frequency Provider Last Rate Last Admin   0.9 %  sodium chloride infusion   Intravenous PRN Shelly Coss, MD   Stopped at 10/04/21 0822   0.9 %  sodium chloride infusion   Intravenous Continuous Shelly Coss, MD 75 mL/hr at 10/04/21 0954 New Bag at 10/04/21 0954   acetaminophen (TYLENOL) tablet 650 mg  650 mg Oral Q6H PRN Kristopher Oppenheim, DO       cefTRIAXone (ROCEPHIN) 2 g in sodium chloride 0.9 % 100 mL IVPB  2 g Intravenous Q24H Kristopher Oppenheim, DO   Stopped at 10/04/21 3235   dextrose 50 % solution 0-50 mL  0-50 mL Intravenous PRN Kristopher Oppenheim, DO       feeding supplement (ENSURE ENLIVE / ENSURE PLUS) liquid 237 mL  237 mL Oral TID BM Adhikari, Amrit, MD   237 mL at 10/04/21 0840   heparin injection 5,000 Units  5,000 Units Subcutaneous Q8H Kristopher Oppenheim, DO   5,000 Units at 10/04/21 0656   insulin aspart (novoLOG) injection 0-5 Units  0-5 Units Subcutaneous QHS Shelly Coss, MD   2 Units at 10/03/21 2120   insulin aspart (novoLOG) injection 0-9 Units  0-9 Units Subcutaneous TID WC Shelly Coss, MD   2 Units at 10/04/21 0840   insulin glargine-yfgn (SEMGLEE) injection 30 Units  30 Units Subcutaneous Daily Shelly Coss, MD   30 Units at 10/04/21 0844   multivitamin with minerals tablet 1 tablet  1 tablet Oral Daily Shelly Coss, MD   1 tablet at 10/04/21 0841   ondansetron (ZOFRAN) injection 4 mg  4 mg Intravenous Q6H PRN Kristopher Oppenheim, DO   4 mg at 10/04/21 0655   pantoprazole (PROTONIX) EC tablet 40 mg  40 mg Oral Daily Kristopher Oppenheim, DO   40 mg at 10/04/21 5732     Discharge  Medications: Please see discharge summary for a list of discharge medications.  Relevant Imaging Results:  Relevant Lab Results:   Additional Information [phone], Both Covid 1 booster  Milas Gain, LCSWA

## 2021-10-04 NOTE — Progress Notes (Signed)
Pt O2 saturation dropped to 82% on room air, 2L [place] placed on patient.

## 2021-10-04 NOTE — Progress Notes (Signed)
PROGRESS NOTE  [name]  ZOX:[id number] DOB: 1935/07/20 DOA: 10/02/2021 PCP: Wardell Honour, MD   Brief Narrative:  Patient is a 86 year old female with history of diabetes type 2, COPD, GERD, stroke, status post pacemaker who lives alone, brought to the emergency department after she was found to be hypoglycemic, weak, inability to take care of herself.  As per the report, she is taken care by caregiver who did not come to take care of her for 4 days.  On presentation she was hemodynamically stable.  Lab work showed AKI with creatinine of 1.6, elevated anion gap, hyperglycemia, lactate of 2.2.  UA was suspicious for UTI.  Patient was started on insulin drip, IV fluids, antibiotics.  Admitted for the management of diabetes ketoacidosis.  PT/OT recommended skilled nursing facility on discharge.  Medically stable for discharge as soon as bed is available.  TOC following  Assessment & Plan:  Principal Problem:   DKA (diabetic ketoacidosis) ([place]) Active Problems:   Acute cystitis   Acute metabolic encephalopathy   AKI (acute kidney injury) ([place])   Sepsis with acute renal failure without septic shock (HCC)   COPD mixed type (HCC)   GERD (gastroesophageal reflux disease)   Pacemaker   Diabetic foot ulcers ([place]) - right foot   Protein-calorie malnutrition, severe   DKA/history of diabetes type 2: Takes insulin at home.  Was not given medications by caregiver for 4 days.  Presented with hyperglycemia, elevated anion gap.  Initially started on insulin drip.  Gap has closed.  Transitioned to long-acting and sliding scale.  Diabetic coordinator following.  Hemoglobin A1c pending  UTI: UA was suspicious for UTI.  Patient reported dysuria.  Culture showing lactobacillus.  Currently on ceftriaxone,plan for 5 days course.  Presented with elevated lactic acid level, resolved.  Leukocytosis resolved  AKI: Resolved with IV fluids  Acute metabolic encephalopathy: Likely due to combination of  DKA, cystitis, dehydration, AKI.  Currently mental status at baseline, alert and oriented  Hypertension: Takes Avapro, metoprolol, these medications are on hold.  GERD: Continue PPI  History of coronary artery disease: Takes Plavix.  History of COPD: Currently not in exacerbation.  Currently on room air.  Continue inhalers  Diabetic foot ulcers on right foot: Old wounds.  Chronic.  Do not appear infected.  Wound care following  Hypokalemia: Supplemented with potassium  Severe protein calorie malnutrition: Nutritionist following  Debility/deconditioning: PT/OT recommended skilled nursing facility on discharge.  Status post pacemaker placement.       Nutrition Problem: Severe Malnutrition Etiology: chronic illness (COPD, stroke) Pressure Injury Ankle Right;Medial Stage 2 -  Partial thickness loss of dermis presenting as a shallow open injury with a red, pink wound bed without slough. (Active)     Location: Ankle  Location Orientation: Right;Medial  Staging: Stage 2 -  Partial thickness loss of dermis presenting as a shallow open injury with a red, pink wound bed without slough.  Wound Description (Comments):   Present on Admission: Yes  Dressing Type Other (Comment) 10/04/21 0800    DVT prophylaxis:heparin injection 5,000 Units Start: 10/03/21 0600 SCDs Start: 10/02/21 2337     Code Status: Full Code  Family Communication: None at bedside.  Patient says she does not have any family members  Patient status: Inpatient  Patient is from : Home  Anticipated discharge to: Skilled nursing facility  Estimated DC date: As soon as bed is available   Consultants: None  Procedures: None  Antimicrobials:  Anti-infectives (From admission, onward)  Start     Dose/Rate Route Frequency Ordered Stop   10/03/21 2000  ceFEPIme (MAXIPIME) 2 g in sodium chloride 0.9 % 100 mL IVPB  Status:  Discontinued        2 g 200 mL/hr over 30 Minutes Intravenous Every 24 hours 10/02/21  2030 10/02/21 2336   10/03/21 0800  cefTRIAXone (ROCEPHIN) 2 g in sodium chloride 0.9 % 100 mL IVPB        2 g 200 mL/hr over 30 Minutes Intravenous Every 24 hours 10/02/21 2336     10/02/21 2030  vancomycin variable dose per unstable renal function (pharmacist dosing)  Status:  Discontinued         Does not apply See admin instructions 10/02/21 2030 10/02/21 2336   10/02/21 1845  ceFEPIme (MAXIPIME) 2 g in sodium chloride 0.9 % 100 mL IVPB        2 g 200 mL/hr over 30 Minutes Intravenous  Once 10/02/21 1835 10/02/21 2040   10/02/21 1845  metroNIDAZOLE (FLAGYL) IVPB 500 mg        500 mg 100 mL/hr over 60 Minutes Intravenous  Once 10/02/21 1835 10/02/21 2159   10/02/21 1845  vancomycin (VANCOCIN) IVPB 1000 mg/200 mL premix  Status:  Discontinued        1,000 mg 200 mL/hr over 60 Minutes Intravenous  Once 10/02/21 1835 10/02/21 1843   10/02/21 1845  vancomycin (VANCOREADY) IVPB 1250 mg/250 mL        1,250 mg 166.7 mL/hr over 90 Minutes Intravenous  Once 10/02/21 1843 10/02/21 2237       Subjective: Patient seen and examined at the bedside this morning.  Hemodynamically stable today.  Still feeling very weak, complains of vague abdominal discomfort but overall looks comfortable than yesterday.  During my discussion, she was reluctant to go to skilled facility and opted for home health.  I requested her to think about it because she lives alone  Objective: Vitals:   10/04/21 0102 10/04/21 0443 10/04/21 0748 10/04/21 0929  BP: (!) 99/51   (!) 122/59  Pulse: 99   100  Resp: 17   (!) 23  Temp: 97.6 F (36.4 C) 98.2 F (36.8 C) 98.7 F (37.1 C) 99.1 F (37.3 C)  TempSrc: Axillary Oral Oral Axillary  SpO2: 99%   95%  Weight:      Height:        Intake/Output Summary (Last 24 hours) at 10/04/2021 1127 Last data filed at 10/04/2021 0930 Gross per 24 hour  Intake 3007.96 ml  Output 875 ml  Net 2132.96 ml   Filed Weights   10/02/21 2319  Weight: 56.7 kg     Examination:  General exam: Overall comfortable, not in distress, deconditioned elderly female HEENT: PERRL Respiratory system:  no wheezes or crackles  Cardiovascular system: Paced rhythm Gastrointestinal system: Abdomen is nondistended, soft and nontender. Central nervous system: Alert and oriented Extremities: No edema, no clubbing ,no cyanosis Skin: No rashes, superficial ulcer on the right foot,no icterus     Data Reviewed: I have personally reviewed following labs and imaging studies  CBC: Recent Labs  Lab 10/02/21 1835 10/02/21 2029 10/02/21 2030 10/03/21 0329 10/04/21 0714  WBC 12.4*  --   --  11.8* 6.3  NEUTROABS 10.5*  --   --  8.2*  --   HGB 18.8* 18.7* 18.4* 17.0* 14.7  HCT 61.6* 55.0* 54.0* 52.5* 46.1*  MCV 94.5  --   --  89.3 88.8  PLT 209  --   --  188 269*   Basic Metabolic Panel: Recent Labs  Lab 10/02/21 1835 10/02/21 2029 10/02/21 2030 10/03/21 0059 10/03/21 0329 10/03/21 0731 10/04/21 0714  NA 140 139 139 140 140 141 138  K 4.8 4.9 4.9 3.8 4.1 3.3* 3.8  CL 99 105  --  106 106 107 105  CO2 10*  --   --  14* 17* 26 29  GLUCOSE 468* 463*  --  309* 253* 156* 185*  BUN 30* 35*  --  29* 28* 23 12  CREATININE 1.63* 0.70  --  1.44* 1.27* 0.92 0.77  CALCIUM 10.1  --   --  8.9 9.2 8.8* 8.6*  MG  --   --   --   --  1.8  --   --      Recent Results (from the past 240 hour(s))  Blood Culture (routine x 2)     Status: None (Preliminary result)   Collection Time: 10/02/21  6:35 PM   Specimen: BLOOD LEFT FOREARM  Result Value Ref Range Status   Specimen Description BLOOD LEFT FOREARM  Final   Special Requests   Final    BOTTLES DRAWN AEROBIC AND ANAEROBIC Blood Culture adequate volume   Culture   Final    NO GROWTH 2 DAYS Performed at [facility], [address], [place], [place] [address]    Report Status PENDING  Incomplete  Blood Culture (routine x 2)     Status: None (Preliminary result)   Collection Time: 10/02/21  6:40 PM    Specimen: BLOOD RIGHT WRIST  Result Value Ref Range Status   Specimen Description BLOOD RIGHT WRIST  Final   Special Requests   Final    BOTTLES DRAWN AEROBIC AND ANAEROBIC Blood Culture adequate volume   Culture   Final    NO GROWTH 2 DAYS Performed at [facility], [place] [address] [place], [place] [address]    Report Status PENDING  Incomplete  Resp Panel by RT-PCR (Flu A&B, Covid) Anterior Nasal Swab     Status: None   Collection Time: 10/02/21  8:20 PM   Specimen: Anterior Nasal Swab  Result Value Ref Range Status   SARS Coronavirus 2 by RT PCR NEGATIVE NEGATIVE Final    Comment: (NOTE) SARS-CoV-2 target nucleic acids are NOT DETECTED.  The SARS-CoV-2 RNA is generally detectable in upper respiratory specimens during the acute phase of infection. The lowest concentration of SARS-CoV-2 viral copies this assay can detect is 138 copies/mL. A negative result does not preclude SARS-Cov-2 infection and should not be used as the sole basis for treatment or other patient management decisions. A negative result may occur with  improper specimen collection/handling, submission of specimen other than nasopharyngeal swab, presence of viral mutation(s) within the areas targeted by this assay, and inadequate number of viral copies(<138 copies/mL). A negative result must be combined with clinical observations, patient history, and epidemiological information. The expected result is Negative.  Fact Sheet for Patients:  EntrepreneurPulse.com.au  Fact Sheet for Healthcare Providers:  IncredibleEmployment.be  This test is no t yet approved or cleared by the [place] FDA and  has been authorized for detection and/or diagnosis of SARS-CoV-2 by FDA under an Emergency Use Authorization (EUA). This EUA will remain  in effect (meaning this test can be used) for the duration of the COVID-19 declaration under Section 564(b)(1) of the Act,  21 U.S.C.section 360bbb-3(b)(1), unless the authorization is terminated  or revoked sooner.  Influenza A by PCR NEGATIVE NEGATIVE Final   Influenza B by PCR NEGATIVE NEGATIVE Final    Comment: (NOTE) The Xpert Xpress SARS-CoV-2/FLU/RSV plus assay is intended as an aid in the diagnosis of influenza from Nasopharyngeal swab specimens and should not be used as a sole basis for treatment. Nasal washings and aspirates are unacceptable for Xpert Xpress SARS-CoV-2/FLU/RSV testing.  Fact Sheet for Patients: EntrepreneurPulse.com.au  Fact Sheet for Healthcare Providers: IncredibleEmployment.be  This test is not yet approved or cleared by the [place] FDA and has been authorized for detection and/or diagnosis of SARS-CoV-2 by FDA under an Emergency Use Authorization (EUA). This EUA will remain in effect (meaning this test can be used) for the duration of the COVID-19 declaration under Section 564(b)(1) of the Act, 21 U.S.C. section 360bbb-3(b)(1), unless the authorization is terminated or revoked.  Performed at [facility], [place] [address], [place], [place] [address]   Urine Culture     Status: Abnormal (Preliminary result)   Collection Time: 10/02/21  8:20 PM   Specimen: In/Out Cath Urine  Result Value Ref Range Status   Specimen Description IN/OUT CATH URINE  Final   Special Requests NONE  Final   Culture (A)  Final    >=100,000 COLONIES/mL LACTOBACILLUS SPECIES Standardized susceptibility testing for this organism is not available. CULTURE REINCUBATED FOR BETTER GROWTH Performed at [facility], [place] [address] [place], [place] [address]    Report Status PENDING  Incomplete     Radiology Studies: DG Chest Port 1 View  Result Date: 10/02/2021 CLINICAL DATA:  Questionable sepsis.  Evaluate for abnormality. EXAM: PORTABLE CHEST 1 VIEW COMPARISON:  Chest two views 02/08/2021 FINDINGS: Left chest wall cardiac pacer with  leads overlying the right atrium and right ventricle. Cardiac silhouette and mediastinal contours are unchanged and within normal limits with moderate calcification again seen within the aortic arch. Surgical clips overlie the midline neck base. The lungs are clear. No pleural effusion or pneumothorax. Minimal levocurvature of the midthoracic spine. IMPRESSION: No acute cardiopulmonary disease process. Electronically Signed   By: Yvonne Kendall M.D.   On: 10/02/2021 19:13    Scheduled Meds:  feeding supplement  237 mL Oral TID BM   heparin  5,000 Units Subcutaneous Q8H   insulin aspart  0-5 Units Subcutaneous QHS   insulin aspart  0-9 Units Subcutaneous TID WC   insulin glargine-yfgn  30 Units Subcutaneous Daily   multivitamin with minerals  1 tablet Oral Daily   pantoprazole  40 mg Oral Daily   Continuous Infusions:  sodium chloride Stopped (10/04/21 0822)   sodium chloride 75 mL/hr at 10/04/21 0954   cefTRIAXone (ROCEPHIN)  IV Stopped (10/04/21 2979)     LOS: 2 days   Shelly Coss, MD [facility] P5/30/2023, 11:27 AM

## 2021-10-04 NOTE — TOC Initial Note (Addendum)
Transition of Care [facility]) - Initial/Assessment Note    Patient Details  Name: [name] MRN: [id number] Date of Birth: [birth year]  [facility] CM/SW Contact:    Milas Gain, [place] Phone Number: 10/04/2021, 11:00 AM  Clinical Narrative:         Update- CSW spoke with patient at bedside and provided SNF bed offers. Patient chose SNF placement at [facility]. CSW spoke with Star with [facility] who confirmed SNF bed offer. CSW will continue to follow and assist with patients dc planning needs.           CSW received consult for possible SNF placement at time of discharge. CSW spoke with patient at bedside regarding PT recommendation of SNF placement at time of discharge. Patient expressed understanding of PT recommendation and is agreeable to SNF placement at time of discharge. Patient reports she comes from home alone.Patient gave CSW permission to fax out initial referral near the [place] area.CSW discussed insurance authorization process and will provide Medicare compare SNF ratings list when available. Patient reports she has received the COVID vaccines as well as 1 booster. Patient expressed being hopeful for rehab and to feel better soon. No further questions reported at this time. CSW to continue to follow and assist with discharge planning needs.  Expected Discharge Plan: Skilled Nursing Facility Barriers to Discharge: Continued Medical Work up   Patient Goals and CMS Choice Patient states their goals for this hospitalization and ongoing recovery are:: SNF CMS Medicare.gov Compare Post Acute Care list provided to:: Patient Choice offered to / list presented to : Patient  Expected Discharge Plan and Services Expected Discharge Plan: [place] In-house Referral: Clinical Social Work     Living arrangements for the past 2 months: Single Family Home                                      Prior Living Arrangements/Services Living  arrangements for the past 2 months: Single Family Home Lives with:: Self Patient language and need for interpreter reviewed:: Yes Do you feel safe going back to the place where you live?: No   SNF  Need for Family Participation in Patient Care: Yes (Comment) Care giver support system in place?: Yes (comment)   Criminal Activity/Legal Involvement Pertinent to Current Situation/Hospitalization: No - Comment as needed  Activities of Daily Living Home Assistive Devices/Equipment: None ADL Screening (condition at time of admission) Patient's cognitive ability adequate to safely complete daily activities?: No Is the patient deaf or have difficulty hearing?: No Does the patient have difficulty seeing, even when wearing glasses/contacts?: No Does the patient have difficulty concentrating, remembering, or making decisions?: Yes Patient able to express need for assistance with ADLs?: Yes Does the patient have difficulty dressing or bathing?: Yes Independently performs ADLs?: No Communication: Independent Dressing (OT): Needs assistance Is this a change from baseline?: Pre-admission baseline Grooming: Needs assistance Is this a change from baseline?: Pre-admission baseline Feeding: Needs assistance Is this a change from baseline?: Pre-admission baseline Bathing: Needs assistance Is this a change from baseline?: Pre-admission baseline Toileting: Needs assistance Is this a change from baseline?: Pre-admission baseline In/Out Bed: Needs assistance Is this a change from baseline?: Pre-admission baseline Walks in Home: Independent Does the patient have difficulty walking or climbing stairs?: Yes Weakness of Legs: None Weakness of Arms/Hands: None  Permission Sought/Granted Permission sought to share information with : Case Manager,  Family Supports, Chartered certified accountant granted to share information with : Yes, Verbal Permission Granted  Share Information with NAME:  Louie Casa  Permission granted to share info w AGENCY: SNF  Permission granted to share info w Relationship: aide  Permission granted to share info w Contact Information: Louie Casa [phone]  Emotional Assessment Appearance:: Appears stated age Attitude/Demeanor/Rapport: Gracious Affect (typically observed): Calm Orientation: : Oriented to Self, Oriented to Place, Oriented to  Time, Oriented to Situation Alcohol / Substance Use: Not Applicable Psych Involvement: No (comment)  Admission diagnosis:  Dehydration [E86.0] DKA (diabetic ketoacidosis) ([place]) [E11.10] Diabetic ketoacidosis without coma associated with type 2 diabetes mellitus ([place]) [E11.10] Sepsis with encephalopathy without septic shock, due to unspecified organism ([place]) [A41.9, R65.20, G93.40] Patient Active Problem List   Diagnosis Date Noted   Protein-calorie malnutrition, severe 10/03/2021   DKA (diabetic ketoacidosis) ([place]) 10/02/2021   AKI (acute kidney injury) ([place]) 10/02/2021   Acute cystitis 10/02/2021   Diabetic foot ulcers ([place]) - right foot 10/02/2021   Sepsis with acute renal failure without septic shock ([place]) 10/02/2021   Seborrheic dermatitis 10/12/2020   Pain due to onychomycosis of toenails of both feet 09/01/2020   Sjogren's syndrome with keratoconjunctivitis sicca ([place]) 06/07/2020   Mild cognitive impairment with memory loss 01/29/2020   Full code status 01/26/2020   Pacemaker 01/19/2020   Pressure injury of skin 01/14/2020   Complete heart block (HCC)    Acute metabolic encephalopathy    Acute on chronic respiratory failure with hypoxia ([place])    Scoliosis due to degenerative disease of spine in adult patient 09/10/2018   Radicular pain of left lower extremity 09/10/2018   Hand arthritis 10/11/2017   Type 2 diabetes mellitus ([place]) 03/08/2017   Diarrhea in adult patient 11/08/2016   Thrombocytopenia ([place]) 11/08/2016   Degenerative lumbar spinal stenosis 10/27/2015   Arthralgia 09/22/2015   Chronic  respiratory failure with hypoxia ([place]) 01/11/2015   Diabetic polyneuropathy associated with type 2 diabetes mellitus ([place]) 11/16/2014   Hypertension 05/19/2013   Vertigo 05/11/2013   Dizziness 05/11/2013   Hyperlipidemia 02/15/2013   Type II diabetes mellitus, uncontrolled 01/01/2013   Benign paroxysmal positional vertigo 08/09/2012   Type II or unspecified type diabetes mellitus with neurological manifestations, uncontrolled(250.62) 08/09/2012   GLAUCOMA 03/16/2010   HOARSENESS 03/16/2010   COUGH 12/23/2009   MUSCULOSKELETAL PAIN 01/28/2009   COPD mixed type ([place]) 01/15/2009   Seasonal and perennial allergic rhinitis 11/23/2007   CHRONIC RHINITIS 07/16/2007   GERD (gastroesophageal reflux disease) 05/29/2007   I B S-DIARRHEAL PREDOMINATE 05/29/2007   FATTY LIVER DISEASE 05/29/2007   PCP:  Wardell Honour, MD Pharmacy:   [place], [place] [address] Phone: [phone] Fax: [place] [phone] - [place], [place] [address] [place] [address] Phone: [phone] Fax: [place], [address] [place] [address] [place] [address] Phone: [phone] Fax: [phone]     Social Determinants of Health (SDOH) Interventions    Readmission Risk Interventions     View : No data to display.

## 2021-10-05 DIAGNOSIS — R1310 Dysphagia, unspecified: Secondary | ICD-10-CM

## 2021-10-05 DIAGNOSIS — E1110 Type 2 diabetes mellitus with ketoacidosis without coma: Secondary | ICD-10-CM | POA: Diagnosis not present

## 2021-10-05 LAB — GLUCOSE, CAPILLARY
Glucose-Capillary: 158 mg/dL — ABNORMAL HIGH (ref 70–99)
Glucose-Capillary: 195 mg/dL — ABNORMAL HIGH (ref 70–99)
Glucose-Capillary: 233 mg/dL — ABNORMAL HIGH (ref 70–99)
Glucose-Capillary: 192 mg/dL — ABNORMAL HIGH (ref 70–99)

## 2021-10-05 NOTE — Consult Note (Addendum)
[facility] Consult: 11:50 AM 10/05/2021  LOS: 3 days    Referring Provider: Dr Tawanna Solo  Primary Care Physician:  Wardell Honour, MD Primary Gastroenterologist:  Dr. Fuller Plan   Reason for Consultation:  Dysphagia.     HPI: [name] is a 86 y.o. female.  Hx CVA.  COPD.  IDDM.  Previous MI.  Chronic Plavix.  Cardiac pacemaker.  IBS, alternating constipation/diarrhea..  Colonic diverticulosis.  Cholecystectomy.  03/2011 EGD, biopsy for dysphagia.  Abdominal pain.  Dr. Maurene Capes saw a sliding, 12 cm hiatal hernia.  A nonobstructing fibrous ring stricture was Maloney dilated with 48 French dilator. 03/2011 colonoscopy.  For unexplained diarrhea and history of chronic diarrhea attributed to diabetic neuropathy/IBS/bile overflow.  Study revealed normal colonoscopy.  Biopsy pathology.  Continued on Questran, Bentyl.  Small bowel and random colon biopsies were all benign.  No evidence for gluten sensitivities in the small bowel.  No evidence for inflammatory bowel disease, microscopic or infectious colitis on the colon. 12/2016 sigmoidoscopy.  By Dr. Neita Goodnight at [employer].  For evaluation of diarrhea and rectal bleeding.  Noted nonbleeding hemorrhoids.  Moderate volume solid stool in rectum, sigmoid, descending colon interfering with visualization.  Biopsies obtained for assessment of microscopic colitis were benign.  Previously treated with probiotics, lactose-free diet, low FODMAP diet and even Xifaxan for possible SIBO.  However none of these measures resulted in improvement.  At office visit in 01/2019 complained to [facility] of LLQ abdominal bulging and noted to have fullness but no definite mass in this area.  A CT was planned but was never performed.  Previous CTAP w contrast 11/2016 for similar complaints where  liver appeared nodular raising concern for cirrhosis, fibrosis but no intestinal or gastric problems.  She was to continue dicyclomine and Imodium prn  Day 4 hospital admission after presenting to ED due to AMS.  Pt home alone without assistance for 4 days and apparently not receiving regular meds because caregiver was ill.  EMS noted pt unable to get off couch.  Confused, tachycardic, hyperglycemic.  Complained of nausea but no vomiting, no abdominal pain.  For me, pt adds she was short of breath and having active cough.  Hemodynamically stable at arrival with mild AKI, DKA.  Urinalysis suspicious for UTI.  Lactate 2.2.  Started on insulin drip, IV fluids, antibiotics.  Clinically improved and deemed medically stable for discharge once SNF bed is available.  She complains of difficulty swallowing.  Mainly solid food.  Occurring for several months.  Occasional regurgitation.  No symptoms suggesting food impaction.  She makes sure that her food is very soft and well masticated before swallowing.  No trouble with liquids.  No pain with swallowing.  No formal speech language study or barium esophagram.  However SLP evaluated the chart and noted she had an esophageal stricture on endoscopy in 2012 and suspect this is the source of the problem.    Has home health assistance for ADLs and medication administration.     Past Medical History:  Diagnosis Date   Acute ischemic  stroke ([place]) 08/10/2021   Anxiety disorder, unspecified    Arthritis    Benign paroxysmal positional vertigo 08/09/2012   Chronic rhinitis    Cognitive communication deficit    Constipation, unspecified    COPD mixed type ([place])    on home O2   Diverticulosis    Esophageal stricture    Essential (primary) hypertension    Gastro-esophageal reflux disease without esophagitis    Glaucoma    Hiatal hernia    Hyperlipidemia, unspecified    Irritable bowel syndrome    Ischemic stroke ([place]) 08/09/2021   Presence of permanent cardiac  pacemaker 01/14/2020   Spinal stenosis, lumbar region, without neurogenic claudication    Steatohepatitis    Thrombocytopenia, unspecified ([place])    Type 2 diabetes mellitus with diabetic polyneuropathy ([place])     Past Surgical History:  Procedure Laterality Date   APPENDECTOMY     CARPAL TUNNEL RELEASE     right hand   CHOLECYSTECTOMY OPEN  1978   COLONOSCOPY  07-2001   mild diverticulosis   ESOPHAGOGASTRODUODENOSCOPY  6962,95-28   H Hernia,es.stricture s/p dil 70F   INTRAOCULAR LENS INSERTION Bilateral    LIVER BIOPSY  08-1322   PACEMAKER IMPLANT N/A 01/14/2020   Procedure: PACEMAKER IMPLANT;  Surgeon: Evans Lance, MD;  Location: [facility];  Service: Cardiovascular;  Laterality: N/A;   PARATHYROID EXPLORATION     TONSILLECTOMY AND ADENOIDECTOMY     TOTAL ABDOMINAL HYSTERECTOMY     ULNAR NERVE TRANSPOSITION  12/07/2011   Procedure: ULNAR NERVE DECOMPRESSION/TRANSPOSITION;this was cancelled-not done  Surgeon: Cammie Sickle., MD;  Location: [facility];  Service: Orthopedics;  Laterality: Right;  right ulnar nerve in situ decompression   ULNAR TUNNEL RELEASE  03/07/2012   Procedure: CUBITAL TUNNEL RELEASE;  Surgeon: Roseanne Kaufman, MD;  Location: [facility];  Service: Orthopedics;  Laterality: Right;  ulnar nerve release at the elbow      Prior to Admission medications   Medication Sig Start Date End Date Taking? Authorizing Provider  acetaminophen (TYLENOL) 325 MG tablet Take 2 tablets (650 mg total) by mouth every 6 (six) hours as needed for mild pain (or Fever >/= 101). 01/23/20   Domenic Polite, MD  ALPRAZolam Duanne Moron) 0.5 MG tablet Take 0.5 tablets (0.25 mg total) by mouth at bedtime as needed for up to 30 doses for sleep. 03/03/21   Ngetich, Dinah C, NP  clopidogrel (PLAVIX) 75 MG tablet Take 1 tablet (75 mg total) by mouth daily. 09/14/21   Shanyce Friar, PA-C  furosemide (LASIX) 20 MG tablet Take 1 tablet (20 mg total) by  mouth daily. Patient taking differently: Take 20 mg by mouth daily. When feet are swollen 08/13/20 10/15/21  Evans Lance, MD  glucose blood (ACCU-CHEK AVIVA PLUS) test strip Use to test blood sugar four times daily. Dx: E11.9 03/07/21   Wardell Honour, MD  insulin aspart (NOVOLOG FLEXPEN) 100 UNIT/ML FlexPen INJECT 12-14 UNITS UNDER THE SKIN AT BREAKFAST, 14-16 UNITS AT LUNCH, AND 12-16 UNITS AT DINNER. Patient taking differently: Inject 12-16 Units into the skin See admin instructions. 12-14 units at breakfast, 14-16 units at lunch, and 12-16 units at dinner 12/08/20   Lauree Chandler, NP  insulin degludec (TRESIBA FLEXTOUCH) 200 UNIT/ML FlexTouch Pen Inject 36 Units into the skin at bedtime. 11/18/20   Lauree Chandler, NP  ipratropium-albuterol (DUONEB) 0.5-2.5 (3) MG/3ML SOLN 1 vial in neb every 8 hours and as needed Patient taking differently:  Take 3 mLs by nebulization every 8 (eight) hours as needed (wheezing/shortness of breath). 01/17/21   Deneise Lever, MD  irbesartan (AVAPRO) 75 MG tablet Take 75 mg by mouth daily.    [provider]  levalbuterol (XOPENEX HFA) 45 MCG/ACT inhaler INHALE 1-2 PUFFS EVERY 6 HOURS IF NEEDED FOR WHEEZING. Patient taking differently: Inhale 1-2 puffs into the lungs every 6 (six) hours as needed for wheezing. 05/31/21   Deneise Lever, MD  metoprolol succinate (TOPROL-XL) 50 MG 24 hr tablet Take 50 mg by mouth 2 (two) times daily.    [provider]  mirabegron ER (MYRBETRIQ) 50 MG TB24 tablet Take 1 tablet (50 mg total) by mouth daily. 09/16/21   Wardell Honour, MD  Multiple Vitamin (MULTIVITAMIN WITH MINERALS) TABS tablet Take 1 tablet by mouth daily. 08/14/21   Pokhrel, Corrie Mckusick, MD  pantoprazole (PROTONIX) 40 MG tablet Take 1 tablet (40 mg total) by mouth daily. 08/13/21 08/13/22  Pokhrel, Corrie Mckusick, MD  polyethylene glycol powder (GLYCOLAX/MIRALAX) 17 GM/SCOOP powder Take 17 g by mouth daily.    [provider]  pravastatin  (PRAVACHOL) 20 MG tablet Take 1 tablet (20 mg total) by mouth daily. 02/28/21   Wardell Honour, MD  thiamine 100 MG tablet Take 1 tablet (100 mg total) by mouth daily. 08/14/21   Pokhrel, Corrie Mckusick, MD  Tiotropium Bromide-Olodaterol (STIOLTO RESPIMAT) 2.5-2.5 MCG/ACT AERS Inhale 2 puffs into the lungs daily. 03/09/21   Deneise Lever, MD  VENTOLIN HFA 108 (90 Base) MCG/ACT inhaler Inhale 2 puffs into the lungs every 6 (six) hours as needed for wheezing or shortness of breath. 08/24/21   [provider]    Scheduled Meds:  feeding supplement  237 mL Oral TID BM   heparin  5,000 Units Subcutaneous Q8H   insulin aspart  0-5 Units Subcutaneous QHS   insulin aspart  0-9 Units Subcutaneous TID WC   insulin glargine-yfgn  30 Units Subcutaneous Daily   multivitamin with minerals  1 tablet Oral Daily   pantoprazole  40 mg Oral Daily   Infusions:  sodium chloride Stopped (10/04/21 0822)   sodium chloride 75 mL/hr at 10/04/21 2231   cefTRIAXone (ROCEPHIN)  IV 2 g (10/05/21 0821)   PRN Meds: sodium chloride, acetaminophen, dextrose, ondansetron (ZOFRAN) IV   Allergies as of 10/02/2021 - Review Complete 10/02/2021  Allergen Reaction Noted   Chlordiazepoxide-clidinium Other (See Comments) 02/27/2011   Biaxin [clarithromycin] Other (See Comments) 09/03/2012   Tramadol Other (See Comments) 09/28/2010   Codeine Other (See Comments) 03/16/2010    Family History  Problem Relation Age of Onset   Heart disease Father    Lung cancer Mother        small cell;Byssinosis   Lung cancer Sister    Liver cancer Sister        ? mets from another area of the body   Diabetes Other        grandmother   Stroke Maternal Grandfather     Social History   Socioeconomic History   Marital status: Single    Spouse name: John,divorced, deceased   Number of children: 0   Years of education: high school   Highest education level: High school graduate  Occupational History   Occupation: Retired      Comment: Training and development officer  Tobacco Use   Smoking status: Former    Types: Cigarettes    Quit date: 05/08/1988    Years since quitting: 33.4   Smokeless tobacco: Never  Vaping  Use   Vaping Use: Never used  Substance and Sexual Activity   Alcohol use: No    Alcohol/week: 0.0 standard drinks   Drug use: No   Sexual activity: Not Currently  Other Topics Concern   Not on file  Social History Narrative   Not on file   Social Determinants of Health   Financial Resource Strain: Not on file  Food Insecurity: Not on file  Transportation Needs: Not on file  Physical Activity: Not on file  Stress: Not on file  Social Connections: Not on file  Intimate Partner Violence: Not on file    REVIEW OF SYSTEMS: Constitutional: Weakness.  No profound fatigue. ENT:  No nose bleeds Pulm: Cough and shortness of breath at home have resolved now.  Generally uses nasal cannula oxygen all the time using anywhere from 1.5 to 2.5 L/min. CV:  No palpitations, no LE edema.  No angina GU:  No hematuria, no frequency GI: See HPI.  Has a bowel movement maybe once or twice a week.  Does not see blood, melena and has not seen any altered bowel habits in recent months. Heme: Denies unusual bleeding or bruising. Transfusions: No recall or evidence in epic of previous blood product transfusion Neuro: Chronic dizziness leading to falls.  Because of this she has purchased a small walker which she uses in the house.  No headaches, no peripheral tingling or numbness Derm:  No itching, no rash or sores.  Endocrine:  No sweats or chills.  No polyuria or dysuria Immunization: Reviewed. Travel: Not queried.   PHYSICAL EXAM: Vital signs in last 24 hours: Vitals:   10/05/21 0644 10/05/21 0827  BP: (!) 142/69 129/82  Pulse: 99 97  Resp: 20 19  Temp:  98.3 F (36.8 C)  SpO2: 96%    Wt Readings from Last 3 Encounters:  10/02/21 56.7 kg  09/23/21 61 kg  09/14/21 61.5 kg    General: Somewhat anxious, pleasant, alert,  aged WF.  Loquacious and somewhat rambling historian. Head: No facial asymmetry or swelling.  No signs of head trauma. Eyes: Scleral icterus or conjunctival pallor. Ears: No obvious hearing deficit Nose: No congestion or discharge Mouth: Mucous membranes are moist, pink, clear.  Edentulous.  Upper dentures in good repair.  No lower dentures. Neck: No JVD, no masses, no thyromegaly Lungs: Greatly reduced breath sounds throughout.  No labored breathing at rest or with speech.  No cough. Heart: RRR.  No MRG.  S1, S2 present Abdomen: Soft without tenderness.  No masses, HSM, bruits, hernias..   Rectal: Deferred Musc/Skeltl: No joint redness, swelling or gross deformity. Extremities: No CCE. Neurologic: Patient is oriented to herself, [facility] but admitted she could not tell me the year.  She cannot tell me why she came to the hospital. Skin: No rash, no sores, no suspicious lesions on incomplete dermatologic survey. Nodes: No cervical adenopathy Psych: Cooperative, pleasant, fluid speech..  Intake/Output from previous day: 05/30 0701 - 05/31 0700 In: 3203.8 [P.O.:390; I.V.:2713.8; IV Piggyback:100] Out: 1700 [Urine:1700] Intake/Output this shift: Total I/O In: -  Out: 450 [Urine:450]  LAB RESULTS: Recent Labs    10/02/21 1835 10/02/21 2029 10/02/21 2030 10/03/21 0329 10/04/21 0714  WBC 12.4*  --   --  11.8* 6.3  HGB 18.8*   < > 18.4* 17.0* 14.7  HCT 61.6*   < > 54.0* 52.5* 46.1*  PLT 209  --   --  188 135*   < > = values in this interval not  displayed.   BMET Lab Results  Component Value Date   NA 138 10/04/2021   NA 141 10/03/2021   NA 140 10/03/2021   K 3.8 10/04/2021   K 3.3 (L) 10/03/2021   K 4.1 10/03/2021   CL 105 10/04/2021   CL 107 10/03/2021   CL 106 10/03/2021   CO2 29 10/04/2021   CO2 26 10/03/2021   CO2 17 (L) 10/03/2021   GLUCOSE 185 (H) 10/04/2021   GLUCOSE 156 (H) 10/03/2021   GLUCOSE 253 (H) 10/03/2021   BUN 12 10/04/2021   BUN 23  10/03/2021   BUN 28 (H) 10/03/2021   CREATININE 0.77 10/04/2021   CREATININE 0.92 10/03/2021   CREATININE 1.27 (H) 10/03/2021   CALCIUM 8.6 (L) 10/04/2021   CALCIUM 8.8 (L) 10/03/2021   CALCIUM 9.2 10/03/2021   LFT Recent Labs    10/02/21 1835  PROT 8.6*  ALBUMIN 4.2  AST 12*  ALT 12  ALKPHOS 62  BILITOT 1.8*   PT/INR Lab Results  Component Value Date   INR 1.2 10/02/2021   INR 1.1 08/09/2021   INR 0.93 07/19/2012   Hepatitis Panel No results for input(s): HEPBSAG, HCVAB, HEPAIGM, HEPBIGM in the last 72 hours. C-Diff No components found for: CDIFF Lipase     Component Value Date/Time   LIPASE 24 10/02/2021 1835    Drugs of Abuse     Component Value Date/Time   LABOPIA NONE DETECTED 08/09/2021 1421   COCAINSCRNUR NONE DETECTED 08/09/2021 1421   LABBENZ NONE DETECTED 08/09/2021 1421   AMPHETMU NONE DETECTED 08/09/2021 1421   THCU NONE DETECTED 08/09/2021 1421   LABBARB NONE DETECTED 08/09/2021 1421     RADIOLOGY STUDIES: No results found.   IMPRESSION:   Dysphagia.  In 2012 underwent EGD with dilation of a nonobstructing distal esophageal ring.  Rule out recurrent stricture.  Rule out esophageal dysmotility.  Patient is anxious about sedation which is required for EGD.  COPD.  Patient reports shortness of breath and cough prior to admission but chest x-ray 4 days ago was unimpressive, did not show anything acute.  Sats at arrival and during this admission are at or above 95% on room air as well as on up to 2 L of nasal cannula oxygen.  Possible cirrhosis of the liver based on CT imaging 11/2016.  LFTs, INR normal.  Slight thrombocytopenia at 135  Chronic Plavix.  This has been on hold since admission 4 days ago but last dose unknown.  Not known if plan is to restart this at some point.    IBS, diarrhea >> constipation in past.  Currently in constipated spectrum.    PLAN:       ?  Pursue barium esophagram for decision-making regarding EGD?  Clinically  she does not look super high risk for sedation but she is on almost chronic home oxygen for her COPD.     Azucena Freed  10/05/2021, 11:50 AM Phone [phone]

## 2021-10-05 NOTE — TOC Progression Note (Signed)
Transition of Care [facility]) - Progression Note    Patient Details  Name: [name] MRN: [id number] Date of Birth: [birth year]  Transition of [facility]) CM/SW [place], [place] Phone Number: 10/05/2021, 10:19 AM  Clinical Narrative:      CSW spoke with patient regarding SNF and patient is now decided that she wants to go home when medically ready for dc. CSW informed MD,RN,and CM. CSW will continue to follow.  Expected Discharge Plan: [place] Barriers to Discharge: Continued Medical Work up  Expected Discharge Plan and Services Expected Discharge Plan: [place] In-house Referral: Clinical Social Work     Living arrangements for the past 2 months: Single Family Home                                       Social Determinants of Health (SDOH) Interventions    Readmission Risk Interventions     View : No data to display.

## 2021-10-05 NOTE — Progress Notes (Signed)
PROGRESS NOTE  [name]  INO:[id number] DOB: October 22, 1935 DOA: 10/02/2021 PCP: Wardell Honour, MD   Brief Narrative:  Patient is a 86 year old female with history of diabetes type 2, COPD, GERD, stroke, status post pacemaker who lives alone, brought to the emergency department after she was found to be hypoglycemic, weak, inability to take care of herself.  As per the report, she is taken care by caregiver who did not come to take care of her for 4 days.  On presentation she was hemodynamically stable.  Lab work showed AKI with creatinine of 1.6, elevated anion gap, hyperglycemia, lactate of 2.2.  UA was suspicious for UTI.  Patient was started on insulin drip, IV fluids, antibiotics.  Admitted for the management of diabetes ketoacidosis.  PT/OT recommended skilled nursing facility on discharge but she declines and wants to go home.  GI consulted today for the suspicion of esophageal dysphagia/history of esophageal stenosis.  Assessment & Plan:  Principal Problem:   DKA (diabetic ketoacidosis) ([facility]) Active Problems:   Acute cystitis   Acute metabolic encephalopathy   AKI (acute kidney injury) ([facility])   Sepsis with acute renal failure without septic shock (HCC)   COPD mixed type (HCC)   GERD (gastroesophageal reflux disease)   Pacemaker   Diabetic foot ulcers ([facility]) - right foot   Protein-calorie malnutrition, severe   DKA/history of diabetes type 2: Takes insulin at home.  Was not given medications by caregiver for 4 days.  Presented with hyperglycemia, elevated anion gap.  Initially started on insulin drip.  Gap has closed.  Transitioned to long-acting and sliding scale.  Diabetic coordinator  following.  Hemoglobin A1c of more than 15.  UTI: UA was suspicious for UTI.  Patient reported dysuria.  Culture showing lactobacillus.  Currently on ceftriaxone,plan for 5 days course.  Presented with elevated lactic acid level, resolved.  Leukocytosis resolved  Dysphagia: Complains of  choking, regurgitation.  Has esophageal stenosis as per EGD in 2012.  GI consulted here.  AKI: Resolved with IV fluids  Acute metabolic encephalopathy: Likely due to combination of DKA, cystitis, dehydration, AKI.  Currently mental status at baseline, alert and oriented  Hypertension: Takes Avapro, metoprolol, these medications are on hold.  GERD: Continue PPI  History of coronary artery disease: Takes Plavix.  History of COPD: Currently not in exacerbation.  Currently on room air.  Continue inhalers  Diabetic foot ulcers on right foot: Old wounds.  Chronic.  Do not appear infected.  Wound care following  Hypokalemia: Supplemented and corrected  Severe protein calorie malnutrition: Nutritionist following  Debility/deconditioning: PT/OT recommended skilled nursing facility on discharge.  Patient does not want to go to skilled facility and wants to go back to home.  Status post pacemaker placement.       Nutrition Problem: Severe Malnutrition Etiology: chronic illness (COPD, stroke) Pressure Injury Ankle Right;Medial Stage 2 -  Partial thickness loss of dermis presenting as a shallow open injury with a red, pink wound bed without slough. (Active)     Location: Ankle  Location Orientation: Right;Medial  Staging: Stage 2 -  Partial thickness loss of dermis presenting as a shallow open injury with a red, pink wound bed without slough.  Wound Description (Comments):   Present on Admission: Yes  Dressing Type Foam - Lift dressing to assess site every shift 10/05/21 0827    DVT prophylaxis:heparin injection 5,000 Units Start: 10/03/21 0600 SCDs Start: 10/02/21 2337     Code Status: Full Code  Family Communication: None  at bedside.  Patient says she does not have any family members  Patient status: Inpatient  Patient is from : Home  Anticipated discharge to: Home with home health  Estimated DC date: After GI work-up   Consultants: None  Procedures:  None  Antimicrobials:  Anti-infectives (From admission, onward)    Start     Dose/Rate Route Frequency Ordered Stop   10/03/21 2000  ceFEPIme (MAXIPIME) 2 g in sodium chloride 0.9 % 100 mL IVPB  Status:  Discontinued        2 g 200 mL/hr over 30 Minutes Intravenous Every 24 hours 10/02/21 2030 10/02/21 2336   10/03/21 0800  cefTRIAXone (ROCEPHIN) 2 g in sodium chloride 0.9 % 100 mL IVPB        2 g 200 mL/hr over 30 Minutes Intravenous Every 24 hours 10/02/21 2336     10/02/21 2030  vancomycin variable dose per unstable renal function (pharmacist dosing)  Status:  Discontinued         Does not apply See admin instructions 10/02/21 2030 10/02/21 2336   10/02/21 1845  ceFEPIme (MAXIPIME) 2 g in sodium chloride 0.9 % 100 mL IVPB        2 g 200 mL/hr over 30 Minutes Intravenous  Once 10/02/21 1835 10/02/21 2040   10/02/21 1845  metroNIDAZOLE (FLAGYL) IVPB 500 mg        500 mg 100 mL/hr over 60 Minutes Intravenous  Once 10/02/21 1835 10/02/21 2159   10/02/21 1845  vancomycin (VANCOCIN) IVPB 1000 mg/200 mL premix  Status:  Discontinued        1,000 mg 200 mL/hr over 60 Minutes Intravenous  Once 10/02/21 1835 10/02/21 1843   10/02/21 1845  vancomycin (VANCOREADY) IVPB 1250 mg/250 mL        1,250 mg 166.7 mL/hr over 90 Minutes Intravenous  Once 10/02/21 1843 10/02/21 2237       Subjective: Patient seen and examined at the bedside this morning.  Hemodynamically stable.  She continues to decline to go to skilled facility and wants to go home.  She complains of inability to eat and regurgitation, choking today Objective: Vitals:   10/05/21 0032 10/05/21 0544 10/05/21 0644 10/05/21 0827  BP: 128/75 (!) 156/79 (!) 142/69 129/82  Pulse: 94 98 99 97  Resp: 20 20 20 19   Temp: 98.7 F (37.1 C) 97.7 F (36.5 C)  98.3 F (36.8 C)  TempSrc: Oral Oral  Oral  SpO2: 96% 96% 96%   Weight:      Height:        Intake/Output Summary (Last 24 hours) at 10/05/2021 1156 Last data filed at 10/05/2021  1127 Gross per 24 hour  Intake 1715.85 ml  Output 1850 ml  Net -134.15 ml   Filed Weights   10/02/21 2319  Weight: 56.7 kg    Examination:  General exam: Overall comfortable, not in distress, very deconditioned elderly female HEENT: PERRL Respiratory system:  no wheezes or crackles  Cardiovascular system: Paced rhythm Gastrointestinal system: Abdomen is nondistended, soft and nontender. Central nervous system: Alert and oriented Extremities: No edema, no clubbing ,no cyanosis, soft stress on the right foot Skin: No rashes, no ulcers,no icterus      Data Reviewed: I have personally reviewed following labs and imaging studies  CBC: Recent Labs  Lab 10/02/21 1835 10/02/21 2029 10/02/21 2030 10/03/21 0329 10/04/21 0714  WBC 12.4*  --   --  11.8* 6.3  NEUTROABS 10.5*  --   --  8.2*  --  HGB 18.8* 18.7* 18.4* 17.0* 14.7  HCT 61.6* 55.0* 54.0* 52.5* 46.1*  MCV 94.5  --   --  89.3 88.8  PLT 209  --   --  188 562*   Basic Metabolic Panel: Recent Labs  Lab 10/02/21 1835 10/02/21 2029 10/02/21 2030 10/03/21 0059 10/03/21 0329 10/03/21 0731 10/04/21 0714  NA 140 139 139 140 140 141 138  K 4.8 4.9 4.9 3.8 4.1 3.3* 3.8  CL 99 105  --  106 106 107 105  CO2 10*  --   --  14* 17* 26 29  GLUCOSE 468* 463*  --  309* 253* 156* 185*  BUN 30* 35*  --  29* 28* 23 12  CREATININE 1.63* 0.70  --  1.44* 1.27* 0.92 0.77  CALCIUM 10.1  --   --  8.9 9.2 8.8* 8.6*  MG  --   --   --   --  1.8  --   --      Recent Results (from the past 240 hour(s))  Blood Culture (routine x 2)     Status: None (Preliminary result)   Collection Time: 10/02/21  6:35 PM   Specimen: BLOOD LEFT FOREARM  Result Value Ref Range Status   Specimen Description BLOOD LEFT FOREARM  Final   Special Requests   Final    BOTTLES DRAWN AEROBIC AND ANAEROBIC Blood Culture adequate volume   Culture   Final    NO GROWTH 3 DAYS Performed at [facility], [address], [place], [place] [address]    Report  Status PENDING  Incomplete  Blood Culture (routine x 2)     Status: None (Preliminary result)   Collection Time: 10/02/21  6:40 PM   Specimen: BLOOD RIGHT WRIST  Result Value Ref Range Status   Specimen Description BLOOD RIGHT WRIST  Final   Special Requests   Final    BOTTLES DRAWN AEROBIC AND ANAEROBIC Blood Culture adequate volume   Culture   Final    NO GROWTH 3 DAYS Performed at [facility], [place] [address] [place],  [address]    Report Status PENDING  Incomplete  Resp Panel by RT-PCR (Flu A&B, Covid) Anterior Nasal Swab     Status: None   Collection Time: 10/02/21  8:20 PM   Specimen: Anterior Nasal Swab  Result Value Ref Range Status   SARS Coronavirus 2 by RT PCR NEGATIVE NEGATIVE Final    Comment: (NOTE) SARS-CoV-2 target nucleic acids are NOT DETECTED.  The SARS-CoV-2 RNA is generally detectable in upper respiratory specimens during the acute phase of infection. The lowest concentration of SARS-CoV-2 viral copies this assay can detect is 138 copies/mL. A negative result does not preclude SARS-Cov-2 infection and should not be used as the sole basis for treatment or other patient management decisions. A negative result may occur with  improper specimen collection/handling, submission of specimen other than nasopharyngeal swab, presence of viral mutation(s) within the areas targeted by this assay, and inadequate number of viral copies(<138 copies/mL). A negative result must be combined with clinical observations, patient history, and epidemiological information. The expected result is Negative.  Fact Sheet for Patients:  EntrepreneurPulse.com.au  Fact Sheet for Healthcare Providers:  IncredibleEmployment.be  This test is no t yet approved or cleared by the [place] FDA and  has been authorized for detection and/or diagnosis of SARS-CoV-2 by FDA under an Emergency Use Authorization (EUA). This EUA will remain  in  effect (meaning this test  can be used) for the duration of the COVID-19 declaration under Section 564(b)(1) of the Act, 21 U.S.C.section 360bbb-3(b)(1), unless the authorization is terminated  or revoked sooner.       Influenza A by PCR NEGATIVE NEGATIVE Final   Influenza B by PCR NEGATIVE NEGATIVE Final    Comment: (NOTE) The Xpert Xpress SARS-CoV-2/FLU/RSV plus assay is intended as an aid in the diagnosis of influenza from Nasopharyngeal swab specimens and should not be used as a sole basis for treatment. Nasal washings and aspirates are unacceptable for Xpert Xpress SARS-CoV-2/FLU/RSV testing.  Fact Sheet for Patients: EntrepreneurPulse.com.au  Fact Sheet for Healthcare Providers: IncredibleEmployment.be  This test is not yet approved or cleared by the [place] FDA and has been authorized for detection and/or diagnosis of SARS-CoV-2 by FDA under an Emergency Use Authorization (EUA). This EUA will remain in effect (meaning this test can be used) for the duration of the COVID-19 declaration under Section 564(b)(1) of the Act, 21 U.S.C. section 360bbb-3(b)(1), unless the authorization is terminated or revoked.  Performed at [facility], [place] [address] [place], [place] [address]   Urine Culture     Status: Abnormal   Collection Time: 10/02/21  8:20 PM   Specimen: In/Out Cath Urine  Result Value Ref Range Status   Specimen Description IN/OUT CATH URINE  Final   Special Requests   Final    NONE Performed at [facility], [place] [address], [place], [place] [address]    Culture (A)  Final    >=100,000 COLONIES/mL LACTOBACILLUS SPECIES Standardized susceptibility testing for this organism is not available. >=100,000 COLONIES/mL YEAST    Report Status 10/04/2021 FINAL  Final     Radiology Studies: No results found.  Scheduled Meds:  feeding supplement  237 mL Oral TID BM   heparin  5,000 Units Subcutaneous Q8H    insulin aspart  0-5 Units Subcutaneous QHS   insulin aspart  0-9 Units Subcutaneous TID WC   insulin glargine-yfgn  30 Units Subcutaneous Daily   multivitamin with minerals  1 tablet Oral Daily   pantoprazole  40 mg Oral Daily   Continuous Infusions:  sodium chloride Stopped (10/04/21 0822)   sodium chloride 75 mL/hr at 10/04/21 2231   cefTRIAXone (ROCEPHIN)  IV 2 g (10/05/21 0821)     LOS: 3 days   Shelly Coss, MD [facility] P5/31/2023, 11:56 AM

## 2021-10-05 NOTE — Plan of Care (Signed)
  Problem: Nutritional: Goal: Maintenance of adequate nutrition will improve Outcome: Not Progressing   Problem: Skin Integrity: Goal: Risk for impaired skin integrity will decrease Outcome: Progressing   Problem: Tissue Perfusion: Goal: Adequacy of tissue perfusion will improve Outcome: Progressing   Problem: Clinical Measurements: Goal: Cardiovascular complication will be avoided Outcome: Progressing   Problem: Elimination: Goal: Will not experience complications related to urinary retention Outcome: Progressing   Problem: Pain Managment: Goal: General experience of comfort will improve Outcome: Progressing   Problem: Safety: Goal: Ability to remain free from injury will improve Outcome: Progressing   Problem: Skin Integrity: Goal: Risk for impaired skin integrity will decrease Outcome: Progressing

## 2021-10-05 NOTE — Progress Notes (Signed)
Physical Therapy Treatment Patient Details Name: [name] MRN: [id number] DOB: 26-Jan-1936 Today's Date: 10/05/2021   History of Present Illness 86 y.o. female presenting to the ED with AMS on 10/02/21. Admitted with AMS and DKA on 10/03/21. Has a caretaker that has been unable to provide care or medications for 4 days. Medical history significant of anxiety; COPD on home O2; DM; Glaucoma, spinal stenosis with neurogenic claudication, HTN; HLD; BPPV, Sjogren syndrome, pacemaker placement; dementia; and NASH    PT Comments    Pt making limited progress with therapy and remains limited by fatigue, back pain, weakness, impaired cognition, with overall poor awareness with current deficits and assist needed for mobility. Pt continues to require +2 Mod assist for bed mobility and transfers with rolling walker. HR elevated to max of 131 bpm with activity and recover to 110's with seated rest. Continued to recommend short term rehab at SNF to improve independence and reduce risk of falling prior to return home. Acute PT will continue to progress as able.     Recommendations for follow up therapy are one component of a multi-disciplinary discharge planning process, led by the attending physician.  Recommendations may be updated based on patient status, additional functional criteria and insurance authorization.  Follow Up Recommendations  Skilled nursing-short term rehab (<3 hours/day)     Assistance Recommended at Discharge Frequent or constant Supervision/Assistance  Patient can return home with the following Assistance with cooking/housework;Direct supervision/assist for medications management;Help with stairs or ramp for entrance;Two people to help with walking and/or transfers;Two people to help with bathing/dressing/bathroom;Assist for transportation   Equipment Recommendations  Other (comment) (TBD, pt has Rollator)    Recommendations for Other Services       Precautions / Restrictions  Precautions Precautions: Fall Restrictions Weight Bearing Restrictions: No     Mobility  Bed Mobility   Bed Mobility: Supine to Sit     Supine to sit: Mod assist, +2 for physical assistance, HOB elevated     General bed mobility comments: Pt required assist to reach for bedrail, and to bring LE off EOB, mod assist to raise trunk and use of bed pad to pivot hips and place feet on floor    Transfers Overall transfer level: Needs assistance Equipment used: Rolling walker (2 wheels) Transfers: Sit to/from Stand, Bed to chair/wheelchair/BSC Sit to Stand: Mod assist, +2 physical assistance   Step pivot transfers: Mod assist, +2 physical assistance       General transfer comment: Pt needed Mod assist to powerup , pt hands on rolling walker to initiate rise. Pt c/o fatigue and immediately sat back down on bed following first stand. Max encouragement with redirection to focus on task and initiate second stand. Mod assist +2 to rise and sequence steps from bed to chair. HR reached 131 bpm with transfer.    Ambulation/Gait                   Stairs             Wheelchair Mobility    Modified Rankin (Stroke Patients Only)       Balance Overall balance assessment: History of Falls, Needs assistance Sitting-balance support: Bilateral upper extremity supported, Feet unsupported Sitting balance-Leahy Scale: Poor     Standing balance support: Bilateral upper extremity supported, During functional activity, Reliant on assistive device for balance Standing balance-Leahy Scale: Poor Standing balance comment: reliant on external support of RW  Cognition                                                Exercises      General Comments        Pertinent Vitals/Pain Pain Assessment Pain Assessment: Faces Faces Pain Scale: Hurts little more Pain Location: back Pain Descriptors / Indicators: Discomfort,  Guarding Pain Intervention(s): Limited activity within patient's tolerance, Monitored during session, Repositioned    Home Living                          Prior Function            PT Goals (current goals can now be found in the care plan section) Acute Rehab PT Goals Patient Stated Goal: to feel better PT Goal Formulation: With patient Time For Goal Achievement: 10/17/21 Potential to Achieve Goals: Good Progress towards PT goals: Progressing toward goals (slow/self limiting)    Frequency    Min 2X/week      PT Plan Current plan remains appropriate    Co-evaluation              AM-PAC PT "6 Clicks" Mobility   Outcome Measure  Help needed turning from your back to your side while in a flat bed without using bedrails?: A Lot Help needed moving from lying on your back to sitting on the side of a flat bed without using bedrails?: A Lot Help needed moving to and from a bed to a chair (including a wheelchair)?: A Lot Help needed standing up from a chair using your arms (e.g., wheelchair or bedside chair)?: A Lot Help needed to walk in hospital room?: Total Help needed climbing 3-5 steps with a railing? : Total 6 Click Score: 10    End of Session Equipment Utilized During Treatment: Gait belt;Oxygen Activity Tolerance: Patient limited by fatigue;Patient tolerated treatment well (self limiting, stalling) Patient left: in chair;with call bell/phone within reach;with chair alarm set Nurse Communication: Mobility status PT Visit Diagnosis: Unsteadiness on feet (R26.81);Other abnormalities of gait and mobility (R26.89);Muscle weakness (generalized) (M62.81)     Time: 0350-0938 PT Time Calculation (min) (ACUTE ONLY): 38 min  Charges:  $Therapeutic Activity: 23-37 mins                     Margie Ege, SPT [place] 10/05/2021, 4:26 PM

## 2021-10-05 NOTE — Care Management Important Message (Signed)
Important Message  Patient Details  Name: [name] MRN: [id number] Date of Birth: [birth year]   Medicare Important Message Given:  Yes     Shelda Altes 10/05/2021, 9:46 AM

## 2021-10-06 ENCOUNTER — Inpatient Hospital Stay (HOSPITAL_COMMUNITY): Payer: Medicare Other

## 2021-10-06 DIAGNOSIS — K222 Esophageal obstruction: Secondary | ICD-10-CM

## 2021-10-06 DIAGNOSIS — R1319 Other dysphagia: Secondary | ICD-10-CM

## 2021-10-06 DIAGNOSIS — R933 Abnormal findings on diagnostic imaging of other parts of digestive tract: Secondary | ICD-10-CM

## 2021-10-06 DIAGNOSIS — E1110 Type 2 diabetes mellitus with ketoacidosis without coma: Secondary | ICD-10-CM | POA: Diagnosis not present

## 2021-10-06 LAB — GLUCOSE, CAPILLARY
Glucose-Capillary: 138 mg/dL — ABNORMAL HIGH (ref 70–99)
Glucose-Capillary: 175 mg/dL — ABNORMAL HIGH (ref 70–99)
Glucose-Capillary: 281 mg/dL — ABNORMAL HIGH (ref 70–99)
Glucose-Capillary: 239 mg/dL — ABNORMAL HIGH (ref 70–99)

## 2021-10-06 MED ORDER — MELATONIN 3 MG PO TABS: 3.0000 mg | ORAL_TABLET | Freq: Every evening | ORAL | Status: DC | PRN

## 2021-10-06 MED ORDER — BISACODYL 10 MG RE SUPP
10.0000 mg | Freq: Once | RECTAL | Status: AC
  Administered 2021-10-06: 10 mg via RECTAL
  Filled 2021-10-06: qty 1

## 2021-10-06 MED ORDER — POLYETHYLENE GLYCOL 3350 17 G PO PACK
17.0000 g | PACK | Freq: Every day | ORAL | Status: DC
  Administered 2021-10-06 – 2021-10-07 (×2): 17 g via ORAL
  Filled 2021-10-06 (×2): qty 1

## 2021-10-06 NOTE — Progress Notes (Signed)
PROGRESS NOTE  [name]  AST:[id number] DOB: Jun 19, 1935 DOA: 10/02/2021 PCP: Wardell Honour, MD   Brief Narrative:  Patient is a 86 year old female with history of diabetes type 2, COPD, GERD, stroke, status post pacemaker who lives alone, brought to the emergency department after she was found to be hypoglycemic, weak, inability to take care of herself.  As per the report, she is taken care by caregiver who did not come to take care of her for 4 days.  On presentation she was hemodynamically stable.  Lab work showed AKI with creatinine of 1.6, elevated anion gap, hyperglycemia, lactate of 2.2.  UA was suspicious for UTI.  Patient was started on insulin drip, IV fluids, antibiotics.  Admitted for the management of diabetes ketoacidosis.  PT/OT recommended skilled nursing facility on discharge.  GI consulted  for the suspicion of esophageal dysphagia/history of esophageal stenosis.  Assessment & Plan:  Principal Problem:   DKA (diabetic ketoacidosis) ([place]) Active Problems:   Acute cystitis   Acute metabolic encephalopathy   AKI (acute kidney injury) ([place])   Sepsis with acute renal failure without septic shock (HCC)   COPD mixed type (HCC)   GERD (gastroesophageal reflux disease)   Pacemaker   Diabetic foot ulcers ([place]) - right foot   Protein-calorie malnutrition, severe   Dysphagia   DKA/history of diabetes type 2: Takes insulin at home.  Was not given medications by caregiver for 4 days.  Presented with hyperglycemia, elevated anion gap.  Initially started on insulin drip.  Gap has closed.  Transitioned to long-acting and sliding scale.  Diabetic coordinator  following.  Hemoglobin A1c of more than 15.  UTI: UA was suspicious for UTI.  Patient reported dysuria.  Culture showing lactobacillus.  Currently on ceftriaxone,plan for 5 days course.  Presented with elevated lactic acid level, resolved.  Leukocytosis resolved  Dysphagia: Complains of choking, regurgitation.  Has  esophageal stenosis as per EGD in 2012.  GI consulted here.  Barium esophagram showed smooth narrowing at the gastroesophageal junction.  She states she does not want to do  procedure but I think she will benefit by EGD, will leave up to GI.On soft diet  AKI: Resolved with IV fluids  Acute metabolic encephalopathy: Likely due to combination of DKA, cystitis, dehydration, AKI.  Currently mental status at baseline, alert and oriented  Hypertension: Takes Avapro, metoprolol, these medications are on hold.  GERD: Continue PPI  History of coronary artery disease: Takes Plavix.  History of COPD: Currently not in exacerbation.  Currently on room air.  Continue inhalers  Diabetic foot ulcers on right foot: Old wounds.  Chronic.  Do not appear infected.  Wound care following  Hypokalemia: Supplemented and corrected  Severe protein calorie malnutrition: Nutritionist following  Debility/deconditioning: PT/OT recommended skilled nursing facility on discharge.  Patient is now agreeable for going to skilled nursing facility.  Status post pacemaker placement.       Nutrition Problem: Severe Malnutrition Etiology: chronic illness (COPD, stroke) Pressure Injury Ankle Right;Medial Stage 2 -  Partial thickness loss of dermis presenting as a shallow open injury with a red, pink wound bed without slough. (Active)     Location: Ankle  Location Orientation: Right;Medial  Staging: Stage 2 -  Partial thickness loss of dermis presenting as a shallow open injury with a red, pink wound bed without slough.  Wound Description (Comments):   Present on Admission: Yes  Dressing Type Foam - Lift dressing to assess site every shift 10/06/21 1000  DVT prophylaxis:heparin injection 5,000 Units Start: 10/03/21 0600 SCDs Start: 10/02/21 2337     Code Status: Full Code  Family Communication: None at bedside.  Patient says she does not have any family members  Patient status: Inpatient  Patient is from :  Home  Anticipated discharge to: Skilled nursing facility  Estimated DC date: After GI work-up   Consultants: None  Procedures: None  Antimicrobials:  Anti-infectives (From admission, onward)    Start     Dose/Rate Route Frequency Ordered Stop   10/03/21 2000  ceFEPIme (MAXIPIME) 2 g in sodium chloride 0.9 % 100 mL IVPB  Status:  Discontinued        2 g 200 mL/hr over 30 Minutes Intravenous Every 24 hours 10/02/21 2030 10/02/21 2336   10/03/21 0800  cefTRIAXone (ROCEPHIN) 2 g in sodium chloride 0.9 % 100 mL IVPB        2 g 200 mL/hr over 30 Minutes Intravenous Every 24 hours 10/02/21 2336 10/08/21 0759   10/02/21 2030  vancomycin variable dose per unstable renal function (pharmacist dosing)  Status:  Discontinued         Does not apply See admin instructions 10/02/21 2030 10/02/21 2336   10/02/21 1845  ceFEPIme (MAXIPIME) 2 g in sodium chloride 0.9 % 100 mL IVPB        2 g 200 mL/hr over 30 Minutes Intravenous  Once 10/02/21 1835 10/02/21 2040   10/02/21 1845  metroNIDAZOLE (FLAGYL) IVPB 500 mg        500 mg 100 mL/hr over 60 Minutes Intravenous  Once 10/02/21 1835 10/02/21 2159   10/02/21 1845  vancomycin (VANCOCIN) IVPB 1000 mg/200 mL premix  Status:  Discontinued        1,000 mg 200 mL/hr over 60 Minutes Intravenous  Once 10/02/21 1835 10/02/21 1843   10/02/21 1845  vancomycin (VANCOREADY) IVPB 1250 mg/250 mL        1,250 mg 166.7 mL/hr over 90 Minutes Intravenous  Once 10/02/21 1843 10/02/21 2237       Subjective:  Patient seen and examined at the bedside this morning.  Hemodynamically stable.  Feels better today.  Swallowing has been better this morning.  We discussed about the finding of esophagram.  She is currently agreeable for skilled nursing facility  Objective: Vitals:   10/05/21 2224 10/06/21 0400 10/06/21 0829 10/06/21 1159  BP: (!) 147/75 116/75 128/68 (!) 110/58  Pulse: 100 95 (!) 104 (!) 104  Resp: 20 20 (!) 22 11  Temp: 97.8 F (36.6 C)  97.8 F  (36.6 C) 98 F (36.7 C)  TempSrc: Oral   Oral  SpO2: 95% 98% 98% 100%  Weight:      Height:        Intake/Output Summary (Last 24 hours) at 10/06/2021 1355 Last data filed at 10/06/2021 1231 Gross per 24 hour  Intake 2468.65 ml  Output 2400 ml  Net 68.65 ml   Filed Weights   10/02/21 2319  Weight: 56.7 kg    Examination:  General exam: Overall comfortable, not in distress, very deconditioned elderly female HEENT: PERRL Respiratory system:  no wheezes or crackles  Cardiovascular system: Paced rhythm.  Gastrointestinal system: Abdomen is nondistended, soft and nontender. Central nervous system: Alert and oriented Extremities: No edema, no clubbing ,no cyanosis Skin: No rashes, no ulcers,no icterus      Data Reviewed: I have personally reviewed following labs and imaging studies  CBC: Recent Labs  Lab 10/02/21 1835 10/02/21 2029 10/02/21 2030 10/03/21  8416 10/04/21 0714  WBC 12.4*  --   --  11.8* 6.3  NEUTROABS 10.5*  --   --  8.2*  --   HGB 18.8* 18.7* 18.4* 17.0* 14.7  HCT 61.6* 55.0* 54.0* 52.5* 46.1*  MCV 94.5  --   --  89.3 88.8  PLT 209  --   --  188 606*   Basic Metabolic Panel: Recent Labs  Lab 10/02/21 1835 10/02/21 2029 10/02/21 2030 10/03/21 0059 10/03/21 0329 10/03/21 0731 10/04/21 0714  NA 140 139 139 140 140 141 138  K 4.8 4.9 4.9 3.8 4.1 3.3* 3.8  CL 99 105  --  106 106 107 105  CO2 10*  --   --  14* 17* 26 29  GLUCOSE 468* 463*  --  309* 253* 156* 185*  BUN 30* 35*  --  29* 28* 23 12  CREATININE 1.63* 0.70  --  1.44* 1.27* 0.92 0.77  CALCIUM 10.1  --   --  8.9 9.2 8.8* 8.6*  MG  --   --   --   --  1.8  --   --      Recent Results (from the past 240 hour(s))  Blood Culture (routine x 2)     Status: None (Preliminary result)   Collection Time: 10/02/21  6:35 PM   Specimen: BLOOD LEFT FOREARM  Result Value Ref Range Status   Specimen Description BLOOD LEFT FOREARM  Final   Special Requests   Final    BOTTLES DRAWN AEROBIC AND  ANAEROBIC Blood Culture adequate volume   Culture   Final    NO GROWTH 3 DAYS Performed at [facility], [address], [place], [place] [address]    Report Status PENDING  Incomplete  Blood Culture (routine x 2)     Status: None (Preliminary result)   Collection Time: 10/02/21  6:40 PM   Specimen: BLOOD RIGHT WRIST  Result Value Ref Range Status   Specimen Description BLOOD RIGHT WRIST  Final   Special Requests   Final    BOTTLES DRAWN AEROBIC AND ANAEROBIC Blood Culture adequate volume   Culture   Final    NO GROWTH 3 DAYS Performed at [facility], [place] [address] [place], [place] [address]    Report Status PENDING  Incomplete  Resp Panel by RT-PCR (Flu A&B, Covid) Anterior Nasal Swab     Status: None   Collection Time: 10/02/21  8:20 PM   Specimen: Anterior Nasal Swab  Result Value Ref Range Status   SARS Coronavirus 2 by RT PCR NEGATIVE NEGATIVE Final    Comment: (NOTE) SARS-CoV-2 target nucleic acids are NOT DETECTED.  The SARS-CoV-2 RNA is generally detectable in upper respiratory specimens during the acute phase of infection. The lowest concentration of SARS-CoV-2 viral copies this assay can detect is 138 copies/mL. A negative result does not preclude SARS-Cov-2 infection and should not be used as the sole basis for treatment or other patient management decisions. A negative result may occur with  improper specimen collection/handling, submission of specimen other than nasopharyngeal swab, presence of viral mutation(s) within the areas targeted by this assay, and inadequate number of viral copies(<138 copies/mL). A negative result must be combined with clinical observations, patient history, and epidemiological information. The expected result is Negative.  Fact Sheet for Patients:  EntrepreneurPulse.com.au  Fact Sheet for Healthcare Providers:  IncredibleEmployment.be  This test is no t yet approved or cleared by  the [place] and  has been authorized for detection and/or diagnosis of SARS-CoV-2 by FDA under an Emergency Use Authorization (EUA). This EUA will remain  in effect (meaning this test can be used) for the duration of the COVID-19 declaration under Section 564(b)(1) of the Act, 21 U.S.C.section 360bbb-3(b)(1), unless the authorization is terminated  or revoked sooner.       Influenza A by PCR NEGATIVE NEGATIVE Final   Influenza B by PCR NEGATIVE NEGATIVE Final    Comment: (NOTE) The Xpert Xpress SARS-CoV-2/FLU/RSV plus assay is intended as an aid in the diagnosis of influenza from Nasopharyngeal swab specimens and should not be used as a sole basis for treatment. Nasal washings and aspirates are unacceptable for Xpert Xpress SARS-CoV-2/FLU/RSV testing.  Fact Sheet for Patients: EntrepreneurPulse.com.au  Fact Sheet for Healthcare Providers: IncredibleEmployment.be  This test is not yet approved or cleared by the [place] FDA and has been authorized for detection and/or diagnosis of SARS-CoV-2 by FDA under an Emergency Use Authorization (EUA). This EUA will remain in effect (meaning this test can be used) for the duration of the COVID-19 declaration under Section 564(b)(1) of the Act, 21 U.S.C. section 360bbb-3(b)(1), unless the authorization is terminated or revoked.  Performed at [facility], [place] [address] [place], [place] [address]   Urine Culture     Status: Abnormal   Collection Time: 10/02/21  8:20 PM   Specimen: In/Out Cath Urine  Result Value Ref Range Status   Specimen Description IN/OUT CATH URINE  Final   Special Requests   Final    NONE Performed at [facility], [place] [address] [place], [place] [address]    Culture (A)  Final    >=100,000 COLONIES/mL LACTOBACILLUS SPECIES Standardized susceptibility testing for this organism is not available. >=100,000 COLONIES/mL YEAST    Report Status  10/04/2021 FINAL  Final     Radiology Studies: DG ESOPHAGUS W SINGLE CM (SOL OR THIN BA)  Result Date: 10/06/2021 CLINICAL DATA:  Dysphagia. Prior history of esophageal stricture with prior dilation procedures. Request for esophagram. EXAM: ESOPHAGUS/BARIUM SWALLOW/TABLET STUDY TECHNIQUE: Single contrast examination was performed using thin liquid barium. This exam was performed by Ascencion Dike PA-C, and was supervised and interpreted by Dr. Sherryl Barters. FLUOROSCOPY: Radiation Exposure Index (as provided by the fluoroscopic device): 17.10 mGy Kerma COMPARISON:  None Available. FINDINGS: Swallowing: Appears normal in the oblique projection. No vestibular penetration or aspiration seen. Pharynx: Unremarkable. Esophagus: The gastroesophageal junction could only be distended to about 1.0 cm, but appears smoothly marginated. This is shown for example on image 9 series 7. Esophageal motility: No gross dysmotility seen however not fully evaluated as patient could only complete examination in the supine position. Hiatal Hernia: Very small type 1 hiatal hernia Gastroesophageal reflux: None visualized. Ingested 13 mm barium tablet: Transient impaction in the distal esophagus . However after approximately 2 minutes, tablet passed into the stomach with the aid of additional thin barium. Contrast did flow freely around the tablet until passage. IMPRESSION: 1. Smooth narrowing at the gastroesophageal junction, causing transient impaction of the 13 mm barium tablet which passed after about 2 minutes with additional swallows of thin barium. No substantial irregularity is identified in this vicinity two further suggest malignancy, although endoscopy may be warranted for further investigation and potential therapy. 2. Very small type 1 hiatal hernia. Electronically Signed   By: Van Clines M.D.   On: 10/06/2021 09:45    Scheduled Meds:  feeding supplement  237 mL Oral TID BM  heparin  5,000 Units Subcutaneous  Q8H   insulin aspart  0-5 Units Subcutaneous QHS   insulin aspart  0-9 Units Subcutaneous TID WC   insulin glargine-yfgn  30 Units Subcutaneous Daily   multivitamin with minerals  1 tablet Oral Daily   pantoprazole  40 mg Oral Daily   Continuous Infusions:  sodium chloride Stopped (10/04/21 0822)   sodium chloride 75 mL/hr at 10/06/21 0216   cefTRIAXone (ROCEPHIN)  IV 2 g (10/06/21 1038)     LOS: 4 days   Shelly Coss, MD [facility] P6/05/2021, 1:55 PM

## 2021-10-06 NOTE — Progress Notes (Addendum)
[facility] Progress Note  CC:    Dysphagia  Subjective: She ate roast beef, mashed potatoes and a few carrots without any difficulty following her barium swallow study this afternoon.  No nausea or vomiting.  No abdominal pain.  She sat on the commode and passed a very small formed stool.  No good bowel movement for the past week. No chest pain or shortness of breath at this time.   Objective:  Vital signs in last 24 hours: Temp:  [97.8 F (36.6 C)-98 F (36.7 C)] 98 F (36.7 C) (06/01 1159) Pulse Rate:  [95-104] 104 (06/01 1159) Resp:  [11-22] 11 (06/01 1159) BP: (110-147)/(58-75) 110/58 (06/01 1159) SpO2:  [95 %-100 %] 100 % (06/01 1159) Last BM Date : 10/06/21 General: 86 year old ill-appearing female in no acute distress. Heart: Regular rate and rhythm.  Cardiac monitor shows she is 100% paced at this time. Pulm: Diminished breath sounds throughout without wheezes or crackles.  On oxygen nasal cannula Abdomen: Protuberant, soft.  Nontender.  Positive bowel sounds to all 4 quadrants. Extremities:  Without edema. Neurologic:  Alert and  oriented x 4.  Mildly hard of hearing.  Speech is clear.  Moves all extremities equally. Psych:  Alert and cooperative. Normal mood and affect.  Intake/Output from previous day: 05/31 0701 - 06/01 0700 In: 2088.7 [P.O.:240; I.V.:1748.7; IV Piggyback:100] Out: 2850 [Urine:2850] Intake/Output this shift: Total I/O In: 720 [P.O.:720] Out: 400 [Urine:400]  Lab Results: Recent Labs    10/04/21 0714  WBC 6.3  HGB 14.7  HCT 46.1*  PLT 135*   BMET Recent Labs    10/04/21 0714  NA 138  K 3.8  CL 105  CO2 29  GLUCOSE 185*  BUN 12  CREATININE 0.77  CALCIUM 8.6*   LFT No results for input(s): PROT, ALBUMIN, AST, ALT, ALKPHOS, BILITOT, BILIDIR, IBILI in the last 72 hours. PT/INR No results for input(s): LABPROT, INR in the last 72 hours. Hepatitis Panel No results for input(s): HEPBSAG, HCVAB, HEPAIGM, HEPBIGM  in the last 72 hours.  DG ESOPHAGUS W SINGLE CM (SOL OR THIN BA)  Result Date: 10/06/2021 CLINICAL DATA:  Dysphagia. Prior history of esophageal stricture with prior dilation procedures. Request for esophagram. EXAM: ESOPHAGUS/BARIUM SWALLOW/TABLET STUDY TECHNIQUE: Single contrast examination was performed using thin liquid barium. This exam was performed by Ascencion Dike PA-C, and was supervised and interpreted by Dr. Sherryl Barters. FLUOROSCOPY: Radiation Exposure Index (as provided by the fluoroscopic device): 17.10 mGy Kerma COMPARISON:  None Available. FINDINGS: Swallowing: Appears normal in the oblique projection. No vestibular penetration or aspiration seen. Pharynx: Unremarkable. Esophagus: The gastroesophageal junction could only be distended to about 1.0 cm, but appears smoothly marginated. This is shown for example on image 9 series 7. Esophageal motility: No gross dysmotility seen however not fully evaluated as patient could only complete examination in the supine position. Hiatal Hernia: Very small type 1 hiatal hernia Gastroesophageal reflux: None visualized. Ingested 13 mm barium tablet: Transient impaction in the distal esophagus . However after approximately 2 minutes, tablet passed into the stomach with the aid of additional thin barium. Contrast did flow freely around the tablet until passage. IMPRESSION: 1. Smooth narrowing at the gastroesophageal junction, causing transient impaction of the 13 mm barium tablet which passed after about 2 minutes with additional swallows of thin barium. No substantial irregularity is identified in this vicinity two further suggest malignancy, although endoscopy may be warranted for further investigation and potential therapy. 2. Very small type 1  hiatal hernia. Electronically Signed   By: Van Clines M.D.   On: 10/06/2021 09:45    Assessment / Plan:  Dysphagia.  S/P EGD with dilation of a nonobstructing distal esophageal ring 2012.  Rule out  recurrent stricture.  Rule out esophageal dysmotility. Barium swallow study today identified a smooth narrowing at the GE junction with transient impaction of the barium tablet which passed in 2 mi small hiatal hernia was noted.  Patient does not wish to pursue an EGD.  She wants to go home tomorrow -Continue Pantoprazole 40 mg p.o. daily -Soft diet -Our GI service will sign off, call with questions or concerns   COPD.  Patient reports shortness of breath and cough prior to admission but chest x-ray 4 days ago was unimpressive, did not show anything acute.  Sats at arrival and during this admission are at or above 95% on room air as well as on up to 2 L of nasal cannula oxygen.   Possible cirrhosis of the liver based on CT imaging 11/2016.  LFTs, INR normal.  Slight thrombocytopenia at 135   Chronic Plavix.  This has been on hold since admission 4 days ago but last dose unknown.  Not known if plan is to restart this at some point.  Constipation -MiraLAX nightly -Dulcolax suppository tonight          Principal Problem:   DKA (diabetic ketoacidosis) ([place]) Active Problems:   COPD mixed type (HCC)   GERD (gastroesophageal reflux disease)   Acute metabolic encephalopathy   Pacemaker   AKI (acute kidney injury) ([place])   Acute cystitis   Diabetic foot ulcers ([place]) - right foot   Sepsis with acute renal failure without septic shock (HCC)   Protein-calorie malnutrition, severe   Dysphagia     LOS: 4 days   [name]  10/06/2021, 3:29 PM

## 2021-10-06 NOTE — TOC Progression Note (Signed)
Transition of Care [facility]) - Progression Note    Patient Details  Name: [name] MRN: [id number] Date of Birth: 01-28-1936  Transition of Care [facility]) CM/SW [place], [place] Phone Number: 10/06/2021, 10:07 AM  Clinical Narrative:     CSW informed by MD patient now agreeable to SNF. CSW met with patient at bedside. Patient confirmed with CSW that she is now agreeable to go to SNF. Patient still wants to go to [facility]. CSW called Star with [facility] who confirmed SNF bed for patient. Star confirmed she can accept patient for SNF placement when medically ready. CSW informed MD. CSW will continue to follow.  Expected Discharge Plan: [place] Barriers to Discharge: Continued Medical Work up  Expected Discharge Plan and Services Expected Discharge Plan: [place] In-house Referral: Clinical Social Work     Living arrangements for the past 2 months: Single Family Home                                       Social Determinants of Health (SDOH) Interventions    Readmission Risk Interventions     View : No data to display.

## 2021-10-06 NOTE — Plan of Care (Signed)
  Problem: Nutritional: Goal: Maintenance of adequate nutrition will improve Outcome: Not Progressing   Problem: Skin Integrity: Goal: Risk for impaired skin integrity will decrease Outcome: Progressing   Problem: Clinical Measurements: Goal: Respiratory complications will improve Outcome: Progressing Goal: Cardiovascular complication will be avoided Outcome: Progressing   Problem: Nutrition: Goal: Adequate nutrition will be maintained Outcome: Not Progressing   Problem: Elimination: Goal: Will not experience complications related to bowel motility Outcome: Progressing Goal: Will not experience complications related to urinary retention Outcome: Progressing   Problem: Pain Managment: Goal: General experience of comfort will improve Outcome: Progressing   Problem: Safety: Goal: Ability to remain free from injury will improve Outcome: Progressing   Problem: Skin Integrity: Goal: Risk for impaired skin integrity will decrease Outcome: Progressing

## 2021-10-06 NOTE — Progress Notes (Signed)
   10/06/21 2054  Assess: MEWS Score  Temp 99.2 F (37.3 C)  BP (!) 151/89  MAP (mmHg) 107  Pulse Rate (!) 121  ECG Heart Rate (!) 121  Resp 17  SpO2 92 %  O2 Device Room Air  Assess: MEWS Score  MEWS Temp 0  MEWS Systolic 0  MEWS Pulse 2  MEWS RR 0  MEWS LOC 0  MEWS Score 2  MEWS Score Color Yellow  Assess: if the MEWS score is Yellow or Red  Were vital signs taken at a resting state? Yes  Focused Assessment No change from prior assessment  Does the patient meet 2 or more of the SIRS criteria? No  Does the patient have a confirmed or suspected source of infection? No  Provider and Rapid Response Notified? No  MEWS guidelines implemented *See Row Information* No, vital signs rechecked  Treat  MEWS Interventions Other (Comment) (Monitor pt HR elevated when anxious or up moving)  Take Vital Signs  Increase Vital Sign Frequency  Yellow: Q 2hr X 2 then Q 4hr X 2, if remains yellow, continue Q 4hrs  Notify: Charge Nurse/RN  Name of Charge Nurse/RN Notified Dana, RN  Date Charge Nurse/RN Notified 10/06/21  Time Charge Nurse/RN Notified 2123  Assess: SIRS CRITERIA  SIRS Temperature  0  SIRS Pulse 1  SIRS Respirations  0  SIRS WBC 0  SIRS Score Sum  1

## 2021-10-06 NOTE — Progress Notes (Signed)
Occupational Therapy Treatment Patient Details Name: LAYONNA DOBIE MRN: [id number] DOB: [birth year] Today's Date: 10/06/2021   History of present illness 86 y.o. female presenting to the ED with AMS on 10/02/21. Admitted with AMS and DKA on 10/03/21. Has a caretaker that has been unable to provide care or medications for 4 days. Medical history significant of anxiety; COPD on home O2; DM; Glaucoma, spinal stenosis with neurogenic claudication, HTN; HLD; BPPV, Sjogren syndrome, pacemaker placement; dementia; and NASH   OT comments  Pt needed mod assist for sit to stand from the EOB during simulated selfcare tasks as well as for toilet transfers using the RW and 3:1.  All toileting tasks including hygiene and clothing management still require overall max assist.  Oxygen sats maintained at 95% or better on 2Ls nasal cannula with HR increasing from 110 BPM at rest up to 120 with activity.  Feel she will continue to benefit from acute care OT with transition to SNF for further rehab since she will not have 24 hr assist at home.     Recommendations for follow up therapy are one component of a multi-disciplinary discharge planning process, led by the attending physician.  Recommendations may be updated based on patient status, additional functional criteria and insurance authorization.    Follow Up Recommendations  Skilled nursing-short term rehab (<3 hours/day)    Assistance Recommended at Discharge Frequent or constant Supervision/Assistance  Patient can return home with the following  A lot of help with bathing/dressing/bathroom;Assistance with cooking/housework;Assistance with feeding;Direct supervision/assist for medications management;Direct supervision/assist for financial management;Assist for transportation;Help with stairs or ramp for entrance;A lot of help with walking and/or transfers   Equipment Recommendations  Other (comment) (TBD next venue of care)       Precautions / Restrictions  Precautions Precautions: Fall Restrictions Weight Bearing Restrictions: No       Mobility Bed Mobility Overal bed mobility: Needs Assistance Bed Mobility: Supine to Sit, Sit to Supine     Supine to sit: Mod assist Sit to supine: Min guard   General bed mobility comments: Pt needed mod assist for lifting trunk up to sitting.    Transfers Overall transfer level: Needs assistance Equipment used: Rolling walker (2 wheels) Transfers: Sit to/from Stand, Bed to chair/wheelchair/BSC Sit to Stand: Mod assist     Step pivot transfers: Mod assist     General transfer comment: Pt needed mod instructional cueing for hand placement.     Balance Overall balance assessment: History of Falls, Needs assistance Sitting-balance support: Bilateral upper extremity supported, Feet unsupported Sitting balance-Leahy Scale: Fair Sitting balance - Comments: able to maintain static balance but demonstrates posterior LOB with LB dressing   Standing balance support: Bilateral upper extremity supported, During functional activity, Reliant on assistive device for balance Standing balance-Leahy Scale: Poor Standing balance comment: Pt needs BUE support to maintain standing balance as well as therapist assist.                           ADL either performed or assessed with clinical judgement   ADL Overall ADL's : Needs assistance/impaired                     Lower Body Dressing: Maximal assistance;Sit to/from stand   Toilet Transfer: Moderate assistance;BSC/3in1;Stand-pivot;Rolling walker (2 wheels)   Toileting- Clothing Manipulation and Hygiene: Maximal assistance;Sit to/from stand       Functional mobility during ADLs: Moderate assistance;Rolling walker (2 wheels) (stand  pivot to the 3:1 at bedside) General ADL Comments: Pt able to follow one step commands, needs emcouragement to stay standing secondary to her fear of falling.  She was unable to doff or donn her mesh  underwear or gripper socks secondary to decreased flexibility.  Oxygen sats remained greater than 95% on 2Ls nasal cannula.  Heart rate increased from 110 in supine up to 120 BPM with activity.               Cognition Arousal/Alertness: Awake/alert Behavior During Therapy: WFL for tasks assessed/performed Overall Cognitive Status: No family/caregiver present to determine baseline cognitive functioning Area of Impairment: Following commands, Awareness                   Current Attention Level: Sustained   Following Commands: Follows one step commands consistently   Awareness: Emergent Problem Solving: Requires verbal cues General Comments: Mod demonstrational cueing for hand placement with sit to stand when using the RW as well as needing encouragement to maintain standing.  Pt was aware of the day of the week and that she would need to go to rehab before home.                   Pertinent Vitals/ Pain       Pain Assessment Pain Assessment: 0-10 Faces Pain Scale: Hurts a little bit Pain Location: buttocks Pain Descriptors / Indicators: Discomfort, Guarding         Frequency  Min 2X/week        Progress Toward Goals  OT Goals(current goals can now be found in the care plan section)     Acute Rehab OT Goals Patient Stated Goal: Wants to go home, but understands she needs assistance. Time For Goal Achievement: 10/17/21 Potential to Achieve Goals: Fair  Plan         AM-PAC OT "6 Clicks" Daily Activity     Outcome Measure   Help from another person eating meals?: None Help from another person taking care of personal grooming?: A Little Help from another person toileting, which includes using toliet, bedpan, or urinal?: A Lot Help from another person bathing (including washing, rinsing, drying)?: A Lot   Help from another person to put on and taking off regular lower body clothing?: A Lot 6 Click Score: 13    End of Session Equipment Utilized During  Treatment: Gait belt;Rolling walker (2 wheels);Oxygen  OT Visit Diagnosis: Unsteadiness on feet (R26.81);Other abnormalities of gait and mobility (R26.89);Repeated falls (R29.6);Muscle weakness (generalized) (M62.81);History of falling (Z91.81);Other symptoms and signs involving cognitive function;Adult, failure to thrive (R62.7)   Activity Tolerance Patient tolerated treatment well   Patient Left with call bell/phone within reach;in bed;with bed alarm set;with nursing/sitter in room   Nurse Communication Mobility status (IV leaking fluid)        Time: 9518-8416 OT Time Calculation (min): 46 min  Charges: OT General Charges $OT Visit: 1 Visit OT Treatments $Self Care/Home Management : 38-52 mins  Leith Szafranski OTR/L 10/06/2021, 5:00 PM

## 2021-10-06 NOTE — Progress Notes (Addendum)
Inpatient Diabetes Program Recommendations  AACE/ADA: New Consensus Statement on Inpatient Glycemic Control (2015)  Target Ranges:  Prepandial:   less than 140 mg/dL      Peak postprandial:   less than 180 mg/dL (1-2 hours)      Critically ill patients:  140 - 180 mg/dL   Lab Results  Component Value Date   GLUCAP 138 (H) 10/06/2021   HGBA1C >15.5 (H) 10/03/2021    Review of Glycemic Control  Diabetes history: type 2 Outpatient Diabetes medications: Tresiba 36 units daily, Novolog 12-14 units BID Current orders for Inpatient glycemic control: Semglee 30 units at HS, Novolog SENSITIVE correction scale TID  & Novolog 0-5 units HS scale.  Inpatient Diabetes Program Recommendations:   Spoke with patient at the bedside. Patient states that her mind is not nearly as good as it used to be since she had a stroke. She forgets things. States that she has been taking her insulin everyday. The help that she has been getting at home has been acquired through [employer] ([employer] The help is not consistent. Patient lives alone and has no family or friends to check on her. States that she saw Dr. Dwyane Dee, endocrinologist, for over 20 years and was told last year that he could not see her anymore. She has seen Dr. Alain Honey as her PCP, but her diabetes has always been hard to control. Her HgbA1C has usually been 12% in the past. Reviewed her current A1C of 15%. She would really like to find an endocrinologist.   She will need care after discharge, especially since her mind is not reliable. Plan is for her to go to SNF at this time and they should be able to figure out a discharge plan from there. She may need assisted living facility if affordable. She states that she would miss her cats at home.   Will continue to monitor blood sugars while in the hospital.  Harvel Ricks RN BSN CDE Diabetes Coordinator Pager: [phone]  8am-5pm   Will continue to monitor blood sugars while

## 2021-10-07 ENCOUNTER — Ambulatory Visit: Payer: Medicare Other | Admitting: Podiatry

## 2021-10-07 DIAGNOSIS — I152 Hypertension secondary to endocrine disorders: Secondary | ICD-10-CM | POA: Diagnosis not present

## 2021-10-07 DIAGNOSIS — R5381 Other malaise: Secondary | ICD-10-CM | POA: Diagnosis not present

## 2021-10-07 DIAGNOSIS — L97312 Non-pressure chronic ulcer of right ankle with fat layer exposed: Secondary | ICD-10-CM | POA: Diagnosis not present

## 2021-10-07 DIAGNOSIS — G8929 Other chronic pain: Secondary | ICD-10-CM | POA: Diagnosis not present

## 2021-10-07 DIAGNOSIS — R1312 Dysphagia, oropharyngeal phase: Secondary | ICD-10-CM | POA: Diagnosis not present

## 2021-10-07 DIAGNOSIS — L97509 Non-pressure chronic ulcer of other part of unspecified foot with unspecified severity: Secondary | ICD-10-CM | POA: Diagnosis not present

## 2021-10-07 DIAGNOSIS — G9341 Metabolic encephalopathy: Secondary | ICD-10-CM | POA: Diagnosis not present

## 2021-10-07 DIAGNOSIS — R531 Weakness: Secondary | ICD-10-CM | POA: Diagnosis not present

## 2021-10-07 DIAGNOSIS — F4320 Adjustment disorder, unspecified: Secondary | ICD-10-CM | POA: Diagnosis not present

## 2021-10-07 DIAGNOSIS — J449 Chronic obstructive pulmonary disease, unspecified: Secondary | ICD-10-CM | POA: Diagnosis not present

## 2021-10-07 DIAGNOSIS — I679 Cerebrovascular disease, unspecified: Secondary | ICD-10-CM | POA: Diagnosis not present

## 2021-10-07 DIAGNOSIS — E118 Type 2 diabetes mellitus with unspecified complications: Secondary | ICD-10-CM | POA: Diagnosis not present

## 2021-10-07 DIAGNOSIS — M48061 Spinal stenosis, lumbar region without neurogenic claudication: Secondary | ICD-10-CM | POA: Diagnosis not present

## 2021-10-07 DIAGNOSIS — N179 Acute kidney failure, unspecified: Secondary | ICD-10-CM | POA: Diagnosis not present

## 2021-10-07 DIAGNOSIS — E43 Unspecified severe protein-calorie malnutrition: Secondary | ICD-10-CM | POA: Diagnosis not present

## 2021-10-07 DIAGNOSIS — R2681 Unsteadiness on feet: Secondary | ICD-10-CM | POA: Diagnosis not present

## 2021-10-07 DIAGNOSIS — K219 Gastro-esophageal reflux disease without esophagitis: Secondary | ICD-10-CM | POA: Diagnosis not present

## 2021-10-07 DIAGNOSIS — M6281 Muscle weakness (generalized): Secondary | ICD-10-CM | POA: Diagnosis not present

## 2021-10-07 DIAGNOSIS — R41841 Cognitive communication deficit: Secondary | ICD-10-CM | POA: Diagnosis not present

## 2021-10-07 DIAGNOSIS — E1110 Type 2 diabetes mellitus with ketoacidosis without coma: Secondary | ICD-10-CM | POA: Diagnosis not present

## 2021-10-07 DIAGNOSIS — F419 Anxiety disorder, unspecified: Secondary | ICD-10-CM | POA: Diagnosis not present

## 2021-10-07 DIAGNOSIS — F411 Generalized anxiety disorder: Secondary | ICD-10-CM | POA: Diagnosis not present

## 2021-10-07 DIAGNOSIS — K5901 Slow transit constipation: Secondary | ICD-10-CM | POA: Diagnosis not present

## 2021-10-07 DIAGNOSIS — E1165 Type 2 diabetes mellitus with hyperglycemia: Secondary | ICD-10-CM | POA: Diagnosis not present

## 2021-10-07 DIAGNOSIS — M3501 Sicca syndrome with keratoconjunctivitis: Secondary | ICD-10-CM | POA: Diagnosis not present

## 2021-10-07 DIAGNOSIS — Z7401 Bed confinement status: Secondary | ICD-10-CM | POA: Diagnosis not present

## 2021-10-07 DIAGNOSIS — R1310 Dysphagia, unspecified: Secondary | ICD-10-CM | POA: Diagnosis not present

## 2021-10-07 DIAGNOSIS — I2510 Atherosclerotic heart disease of native coronary artery without angina pectoris: Secondary | ICD-10-CM | POA: Diagnosis not present

## 2021-10-07 DIAGNOSIS — Z794 Long term (current) use of insulin: Secondary | ICD-10-CM | POA: Diagnosis not present

## 2021-10-07 DIAGNOSIS — E11621 Type 2 diabetes mellitus with foot ulcer: Secondary | ICD-10-CM | POA: Diagnosis not present

## 2021-10-07 LAB — CULTURE, BLOOD (ROUTINE X 2)
Culture: NO GROWTH
Culture: NO GROWTH
Special Requests: ADEQUATE
Special Requests: ADEQUATE

## 2021-10-07 LAB — GLUCOSE, CAPILLARY
Glucose-Capillary: 138 mg/dL — ABNORMAL HIGH (ref 70–99)
Glucose-Capillary: 229 mg/dL — ABNORMAL HIGH (ref 70–99)

## 2021-10-07 MED ORDER — MIRABEGRON ER 50 MG PO TB24
50.0000 mg | ORAL_TABLET | Freq: Every day | ORAL | Status: DC
  Administered 2021-10-07: 50 mg via ORAL
  Filled 2021-10-07: qty 1

## 2021-10-07 MED ORDER — IRBESARTAN 75 MG PO TABS
75.0000 mg | ORAL_TABLET | Freq: Every day | ORAL | Status: DC
  Administered 2021-10-07: 75 mg via ORAL
  Filled 2021-10-07: qty 1

## 2021-10-07 MED ORDER — FUROSEMIDE 10 MG/ML IJ SOLN
20.0000 mg | Freq: Once | INTRAMUSCULAR | Status: AC
  Administered 2021-10-07: 20 mg via INTRAVENOUS
  Filled 2021-10-07: qty 2

## 2021-10-07 MED ORDER — FUROSEMIDE 20 MG PO TABS: 20.0000 mg | ORAL_TABLET | Freq: Every day | ORAL | 0 refills | Status: DC

## 2021-10-07 MED ORDER — ALPRAZOLAM 0.5 MG PO TABS: 0.2500 mg | ORAL_TABLET | Freq: Every evening | ORAL | 0 refills | Status: DC | PRN

## 2021-10-07 NOTE — Plan of Care (Signed)
  Problem: Nutritional: Goal: Maintenance of adequate nutrition will improve Outcome: Progressing   Problem: Skin Integrity: Goal: Risk for impaired skin integrity will decrease Outcome: Progressing   Problem: Tissue Perfusion: Goal: Adequacy of tissue perfusion will improve Outcome: Progressing   Problem: Clinical Measurements: Goal: Will remain free from infection Outcome: Progressing Goal: Diagnostic test results will improve Outcome: Progressing Goal: Respiratory complications will improve Outcome: Progressing Goal: Cardiovascular complication will be avoided Outcome: Progressing   Problem: Activity: Goal: Risk for activity intolerance will decrease Outcome: Progressing   Problem: Elimination: Goal: Will not experience complications related to urinary retention Outcome: Progressing   Problem: Safety: Goal: Ability to remain free from injury will improve Outcome: Progressing   Problem: Skin Integrity: Goal: Risk for impaired skin integrity will decrease Outcome: Progressing

## 2021-10-07 NOTE — TOC Transition Note (Signed)
Transition of Care [facility]) - CM/SW Discharge Note   Patient Details  Name: AHLANA SLAYDON MRN: [id number] Date of Birth: January 27, 1936  [facility] of [facility] CM/SW Contact:  Milas Gain, [place] Phone Number: 10/07/2021, 11:11 AM   Clinical Narrative:     Patient will DC to: [place] date: 10/07/2021  Family notified: Louie Casa  Transport by: Corey Harold  ?  Per MD patient ready for DC to [facility]. RN, patient, patient's family, and facility notified of DC. Discharge Summary sent to facility. RN given number for report tele#[phone] RM#[id number]. DC packet on chart. Ambulance transport requested for patient.  CSW signing off.    Final next level of care: Skilled Nursing Facility Barriers to Discharge: No Barriers Identified   Patient Goals and CMS Choice Patient states their goals for this hospitalization and ongoing recovery are:: SNF CMS Medicare.gov Compare Post Acute Care list provided to:: Patient Choice offered to / list presented to : Patient  Discharge Placement              Patient [facility] bed at: [facility] Patient to be transferred to facility by: [facility] Name of family member notified: Louie Casa Patient and family notified of of transfer: 10/07/21  Discharge Plan and Services In-house Referral: Clinical Social Work                                   Social Determinants of Health ([place]) Interventions     Readmission Risk Interventions     View : No data to display.

## 2021-10-07 NOTE — Progress Notes (Signed)
A visitor attempted to see pt after visiting hours had ended @ 2215, visitor claimed they were the pt's sister-in-law by the name of Raenette Rover. Pt stated that she did not know of anyone by the name of Mechele Claude. Visitor advised that normal visiting hours resumed at 0700.

## 2021-10-07 NOTE — Discharge Summary (Signed)
Physician Discharge Summary  KENZLEI RUNIONS KGM:[id number] DOB: 1935-09-23 DOA: 10/02/2021  PCP: Wardell Honour, MD  Admit date: 10/02/2021 Discharge date: 10/07/2021  Admitted From: Home Disposition: SNF  Discharge Condition:Stable CODE STATUS:FULL Diet recommendation: Soft diet  Brief/Interim Summary: Patient is a 86 year old female with history of diabetes type 2, COPD, GERD, stroke, status post pacemaker who lives alone, brought to the emergency department after she was found to be hypoglycemic, weak, inability to take care of herself.  As per the report, she is taken care by caregiver who did not come to take care of her for 4 days.  On presentation she was hemodynamically stable.  Lab work showed AKI with creatinine of 1.6, elevated anion gap, hyperglycemia, lactate of 2.2.  UA was suspicious for UTI.  Patient was started on insulin drip, IV fluids, antibiotics.  Admitted for the management of diabetes ketoacidosis.  PT/OT recommended skilled nursing facility on discharge.  GI consulted  for the suspicion of esophageal dysphagia/history of esophageal stenosis.  Patient declined EGD.  Medically stable for discharge to skilled nursing facility.  She should continue on soft diet.  Following problems were addressed during her hospitalization:  DKA/history of diabetes type 2: Takes insulin at home.  Was not given medications by caregiver for 4 days.  Presented with hyperglycemia, elevated anion gap.  Initially started on insulin drip.  Gap has closed.    Diabetic coordinator  following.  Hemoglobin A1c of more than 15.  Continue home regimen on discharge.   UTI: UA was suspicious for UTI.  Patient reported dysuria.  Culture showing lactobacillus.  Currently on ceftriaxone, completed 5 days course.  Presented with elevated lactic acid level, resolved.  Leukocytosis resolved   Dysphagia: Complains of choking, regurgitation.  Has esophageal stenosis as per EGD in 2012.  GI consulted here.   Barium esophagram showed smooth narrowing at the gastroesophageal junction.  She declined EGD.  GI recommended daily PPI, soft diet, crushed pills if possible   AKI: Resolved with IV fluids   Acute metabolic encephalopathy: Likely due to combination of DKA, cystitis, dehydration, AKI.  Currently mental status at baseline, alert and oriented   Hypertension: Takes Avapro, metoprolol, these medications are continued   GERD: Continue PPI   History of coronary artery disease: Takes Plavix.   History of COPD: Currently not in exacerbation.  Currently on room air.  Continue inhalers   Diabetic foot ulcers on right foot: Old wound.  Chronic.  Do not appear infected.  Wound care was following    Severe protein calorie malnutrition: Nutritionist following   Debility/deconditioning: PT/OT recommended skilled nursing facility on discharge.  Patient is  agreeable for going to skilled nursing facility.   Status post pacemaker placement.    Discharge Diagnoses:  Principal Problem:   DKA (diabetic ketoacidosis) ([place]) Active Problems:   Acute cystitis   Acute metabolic encephalopathy   AKI (acute kidney injury) ([place])   Sepsis with acute renal failure without septic shock (HCC)   COPD mixed type (HCC)   GERD (gastroesophageal reflux disease)   Pacemaker   Diabetic foot ulcers (HCC) - right foot   Protein-calorie malnutrition, severe   Dysphagia   Stricture and stenosis of esophagus   Abnormal esophagram    Discharge Instructions  Discharge Instructions     Diet general   Complete by: As directed    Soft diet   Discharge instructions   Complete by: As directed    1)Please take prescribed medications as instructed.  Discharge wound care:   Complete by: As directed    As per wound care   Increase activity slowly   Complete by: As directed       Allergies as of 10/07/2021       Reactions   Chlordiazepoxide-clidinium Other (See Comments)   Sleepy,weak   Biaxin  [clarithromycin] Other (See Comments)   Foul taste, abd pain, diarrhea   Tramadol Other (See Comments)   Caused tremors   Codeine Other (See Comments)   Upset stomach        Medication List     STOP taking these medications    doxycycline 100 MG tablet Commonly known as: VIBRA-TABS   saccharomyces boulardii 250 MG capsule Commonly known as: Florastor       TAKE these medications    Accu-Chek Aviva Plus test strip Generic drug: glucose blood Use to test blood sugar four times daily. Dx: E11.9   acetaminophen 325 MG tablet Commonly known as: TYLENOL Take 2 tablets (650 mg total) by mouth every 6 (six) hours as needed for mild pain (or Fever >/= 101).   ALPRAZolam 0.5 MG tablet Commonly known as: Xanax Take 0.5 tablets (0.25 mg total) by mouth at bedtime as needed for up to 30 doses for sleep.   clopidogrel 75 MG tablet Commonly known as: PLAVIX Take 1 tablet (75 mg total) by mouth daily.   furosemide 20 MG tablet Commonly known as: LASIX Take 1 tablet (20 mg total) by mouth daily. What changed: additional instructions   ipratropium-albuterol 0.5-2.5 (3) MG/3ML Soln Commonly known as: DUONEB 1 vial in neb every 8 hours and as needed What changed:  how much to take how to take this when to take this reasons to take this additional instructions   irbesartan 75 MG tablet Commonly known as: AVAPRO Take 75 mg by mouth daily.   levalbuterol 45 MCG/ACT inhaler Commonly known as: XOPENEX HFA INHALE 1-2 PUFFS EVERY 6 HOURS IF NEEDED FOR WHEEZING. What changed: See the new instructions.   metoprolol succinate 50 MG 24 hr tablet Commonly known as: TOPROL-XL Take 50 mg by mouth 2 (two) times daily.   mirabegron ER 50 MG Tb24 tablet Commonly known as: MYRBETRIQ Take 1 tablet (50 mg total) by mouth daily.   multivitamin with minerals Tabs tablet Take 1 tablet by mouth daily.   NovoLOG FlexPen 100 UNIT/ML FlexPen Generic drug: insulin aspart INJECT 12-14  UNITS UNDER THE SKIN AT BREAKFAST, 14-16 UNITS AT LUNCH, AND 12-16 UNITS AT DINNER. What changed:  how much to take how to take this when to take this additional instructions   pantoprazole 40 MG tablet Commonly known as: Protonix Take 1 tablet (40 mg total) by mouth daily.   polyethylene glycol powder 17 GM/SCOOP powder Commonly known as: GLYCOLAX/MIRALAX Take 17 g by mouth daily.   pravastatin 20 MG tablet Commonly known as: PRAVACHOL Take 1 tablet (20 mg total) by mouth daily.   Stiolto Respimat 2.5-2.5 MCG/ACT Aers Generic drug: Tiotropium Bromide-Olodaterol Inhale 2 puffs into the lungs daily.   thiamine 100 MG tablet Take 1 tablet (100 mg total) by mouth daily.   Tyler Aas FlexTouch 200 UNIT/ML FlexTouch Pen Generic drug: insulin degludec Inject 36 Units into the skin at bedtime.   Ventolin HFA 108 (90 Base) MCG/ACT inhaler Generic drug: albuterol Inhale 2 puffs into the lungs every 6 (six) hours as needed for wheezing or shortness of breath.  Discharge Care Instructions  (From admission, onward)           Start     Ordered   10/07/21 0000  Discharge wound care:       Comments: As per wound care   10/07/21 1040            Allergies  Allergen Reactions   Chlordiazepoxide-Clidinium Other (See Comments)    Sleepy,weak   Biaxin [Clarithromycin] Other (See Comments)    Foul taste, abd pain, diarrhea   Tramadol Other (See Comments)    Caused tremors   Codeine Other (See Comments)    Upset stomach    Consultations: GI   Procedures/Studies: DG Chest Port 1 View  Result Date: 10/02/2021 CLINICAL DATA:  Questionable sepsis.  Evaluate for abnormality. EXAM: PORTABLE CHEST 1 VIEW COMPARISON:  Chest two views 02/08/2021 FINDINGS: Left chest wall cardiac pacer with leads overlying the right atrium and right ventricle. Cardiac silhouette and mediastinal contours are unchanged and within normal limits with moderate calcification again  seen within the aortic arch. Surgical clips overlie the midline neck base. The lungs are clear. No pleural effusion or pneumothorax. Minimal levocurvature of the midthoracic spine. IMPRESSION: No acute cardiopulmonary disease process. Electronically Signed   By: Yvonne Kendall M.D.   On: 10/02/2021 19:13   CUP PACEART INCLINIC DEVICE CHECK  Result Date: 09/14/2021 Pacemaker check in clinic. Normal device function. Thresholds, sensing, impedances consistent with previous measurements. Device programmed to maximize longevity. known PAT, longest episode 16 seconds. Device programmed at appropriate safety margins. Histogram distribution appropriate for patient activity level. Device programmed to optimize intrinsic conduction. Estimated longevity 8 yr, 7 mo. Patient enrolled in remote follow-up. Patient education completed.  DG ESOPHAGUS W SINGLE CM (SOL OR THIN BA)  Result Date: 10/06/2021 CLINICAL DATA:  Dysphagia. Prior history of esophageal stricture with prior dilation procedures. Request for esophagram. EXAM: ESOPHAGUS/BARIUM SWALLOW/TABLET STUDY TECHNIQUE: Single contrast examination was performed using thin liquid barium. This exam was performed by Ascencion Dike PA-C, and was supervised and interpreted by Dr. Sherryl Barters. FLUOROSCOPY: Radiation Exposure Index (as provided by the fluoroscopic device): 17.10 mGy Kerma COMPARISON:  None Available. FINDINGS: Swallowing: Appears normal in the oblique projection. No vestibular penetration or aspiration seen. Pharynx: Unremarkable. Esophagus: The gastroesophageal junction could only be distended to about 1.0 cm, but appears smoothly marginated. This is shown for example on image 9 series 7. Esophageal motility: No gross dysmotility seen however not fully evaluated as patient could only complete examination in the supine position. Hiatal Hernia: Very small type 1 hiatal hernia Gastroesophageal reflux: None visualized. Ingested 13 mm barium tablet: Transient  impaction in the distal esophagus . However after approximately 2 minutes, tablet passed into the stomach with the aid of additional thin barium. Contrast did flow freely around the tablet until passage. IMPRESSION: 1. Smooth narrowing at the gastroesophageal junction, causing transient impaction of the 13 mm barium tablet which passed after about 2 minutes with additional swallows of thin barium. No substantial irregularity is identified in this vicinity two further suggest malignancy, although endoscopy may be warranted for further investigation and potential therapy. 2. Very small type 1 hiatal hernia. Electronically Signed   By: Van Clines M.D.   On: 10/06/2021 09:45      Subjective: Patient seen and examined at bedside this morning.  Hemodynamically stable for discharge today.  Discharge Exam: Vitals:   10/07/21 0408 10/07/21 0744  BP: 134/71 (!) 161/84  Pulse: 86 (!) 107  Resp: 18 (!)  21  Temp:  98.3 F (36.8 C)  SpO2: 92% 94%   Vitals:   10/07/21 0050 10/07/21 0052 10/07/21 0408 10/07/21 0744  BP:  132/70 134/71 (!) 161/84  Pulse: 99 (!) 102 86 (!) 107  Resp:  20 18 (!) 21  Temp:  98.9 F (37.2 C)  98.3 F (36.8 C)  TempSrc:  Oral  Oral  SpO2:  94% 92% 94%  Weight:      Height:        General: Pt is alert, awake, not in acute distress Cardiovascular: RRR, S1/S2 +, no rubs, no gallops Respiratory: CTA bilaterally, no wheezing, no rhonchi Abdominal: Soft, NT, ND, bowel sounds + Extremities: no edema, no cyanosis    The results of significant diagnostics from this hospitalization (including imaging, microbiology, ancillary and laboratory) are listed below for reference.     Microbiology: Recent Results (from the past 240 hour(s))  Blood Culture (routine x 2)     Status: None   Collection Time: 10/02/21  6:35 PM   Specimen: BLOOD LEFT FOREARM  Result Value Ref Range Status   Specimen Description BLOOD LEFT FOREARM  Final   Special Requests   Final     BOTTLES DRAWN AEROBIC AND ANAEROBIC Blood Culture adequate volume   Culture   Final    NO GROWTH 5 DAYS Performed at [facility], [address] [place], [place] [address]    Report Status 10/07/2021 FINAL  Final  Blood Culture (routine x 2)     Status: None   Collection Time: 10/02/21  6:40 PM   Specimen: BLOOD RIGHT WRIST  Result Value Ref Range Status   Specimen Description BLOOD RIGHT WRIST  Final   Special Requests   Final    BOTTLES DRAWN AEROBIC AND ANAEROBIC Blood Culture adequate volume   Culture   Final    NO GROWTH 5 DAYS Performed at [facility], [place] [address], [place], [place] [address]    Report Status 10/07/2021 FINAL  Final  Resp Panel by RT-PCR (Flu A&B, Covid) Anterior Nasal Swab     Status: None   Collection Time: 10/02/21  8:20 PM   Specimen: Anterior Nasal Swab  Result Value Ref Range Status   SARS Coronavirus 2 by RT PCR NEGATIVE NEGATIVE Final    Comment: (NOTE) SARS-CoV-2 target nucleic acids are NOT DETECTED.  The SARS-CoV-2 RNA is generally detectable in upper respiratory specimens during the acute phase of infection. The lowest concentration of SARS-CoV-2 viral copies this assay can detect is 138 copies/mL. A negative result does not preclude SARS-Cov-2 infection and should not be used as the sole basis for treatment or other patient management decisions. A negative result may occur with  improper specimen collection/handling, submission of specimen other than nasopharyngeal swab, presence of viral mutation(s) within the areas targeted by this assay, and inadequate number of viral copies(<138 copies/mL). A negative result must be combined with clinical observations, patient history, and epidemiological information. The expected result is Negative.  Fact Sheet for Patients:  EntrepreneurPulse.com.au  Fact Sheet for Healthcare Providers:  IncredibleEmployment.be  This test is no t yet approved or  cleared by the [place] FDA and  has been authorized for detection and/or diagnosis of SARS-CoV-2 by FDA under an Emergency Use Authorization (EUA). This EUA will remain  in effect (meaning this test can be used) for the duration of the COVID-19 declaration under Section 564(b)(1) of the Act, 21 U.S.C.section 360bbb-3(b)(1), unless the authorization is terminated  or revoked sooner.       Influenza A by PCR NEGATIVE NEGATIVE Final   Influenza B by PCR NEGATIVE NEGATIVE Final    Comment: (NOTE) The Xpert Xpress SARS-CoV-2/FLU/RSV plus assay is intended as an aid in the diagnosis of influenza from Nasopharyngeal swab specimens and should not be used as a sole basis for treatment. Nasal washings and aspirates are unacceptable for Xpert Xpress SARS-CoV-2/FLU/RSV testing.  Fact Sheet for Patients: EntrepreneurPulse.com.au  Fact Sheet for Healthcare Providers: IncredibleEmployment.be  This test is not yet approved or cleared by the [place] FDA and has been authorized for detection and/or diagnosis of SARS-CoV-2 by FDA under an Emergency Use Authorization (EUA). This EUA will remain in effect (meaning this test can be used) for the duration of the COVID-19 declaration under Section 564(b)(1) of the Act, 21 U.S.C. section 360bbb-3(b)(1), unless the authorization is terminated or revoked.  Performed at [facility], [place] [address] [place], [place] [address]   Urine Culture     Status: Abnormal   Collection Time: 10/02/21  8:20 PM   Specimen: In/Out Cath Urine  Result Value Ref Range Status   Specimen Description IN/OUT CATH URINE  Final   Special Requests   Final    NONE Performed at [facility], [place] [address], [place], [place] [address]    Culture (A)  Final    >=100,000 COLONIES/mL LACTOBACILLUS SPECIES Standardized susceptibility testing for this organism is not available. >=100,000 COLONIES/mL YEAST    Report  Status 10/04/2021 FINAL  Final     Labs: BNP (last 3 results) No results for input(s): BNP in the last 8760 hours. Basic Metabolic Panel: Recent Labs  Lab 10/02/21 1835 10/02/21 2029 10/02/21 2030 10/03/21 0059 10/03/21 0329 10/03/21 0731 10/04/21 0714  NA 140 139 139 140 140 141 138  K 4.8 4.9 4.9 3.8 4.1 3.3* 3.8  CL 99 105  --  106 106 107 105  CO2 10*  --   --  14* 17* 26 29  GLUCOSE 468* 463*  --  309* 253* 156* 185*  BUN 30* 35*  --  29* 28* 23 12  CREATININE 1.63* 0.70  --  1.44* 1.27* 0.92 0.77  CALCIUM 10.1  --   --  8.9 9.2 8.8* 8.6*  MG  --   --   --   --  1.8  --   --    Liver Function Tests: Recent Labs  Lab 10/02/21 1835  AST 12*  ALT 12  ALKPHOS 62  BILITOT 1.8*  PROT 8.6*  ALBUMIN 4.2   Recent Labs  Lab 10/02/21 1835  LIPASE 24   No results for input(s): AMMONIA in the last 168 hours. CBC: Recent Labs  Lab 10/02/21 1835 10/02/21 2029 10/02/21 2030 10/03/21 0329 10/04/21 0714  WBC 12.4*  --   --  11.8* 6.3  NEUTROABS 10.5*  --   --  8.2*  --   HGB 18.8* 18.7* 18.4* 17.0* 14.7  HCT 61.6* 55.0* 54.0* 52.5* 46.1*  MCV 94.5  --   --  89.3 88.8  PLT 209  --   --  188 135*   Cardiac Enzymes: Recent Labs  Lab 10/02/21 1835  CKTOTAL 56   BNP: Invalid input(s): POCBNP CBG: Recent Labs  Lab 10/06/21 0947 10/06/21 1202 10/06/21 1701 10/06/21 2102 10/07/21 0750  GLUCAP 138* 175* 281* 239* 138*   D-Dimer No results for input(s): DDIMER in the last 72 hours. Hgb A1c No results  for input(s): HGBA1C in the last 72 hours. Lipid Profile No results for input(s): CHOL, HDL, LDLCALC, TRIG, CHOLHDL, LDLDIRECT in the last 72 hours. Thyroid function studies No results for input(s): TSH, T4TOTAL, T3FREE, THYROIDAB in the last 72 hours.  Invalid input(s): FREET3 Anemia work up No results for input(s): VITAMINB12, FOLATE, FERRITIN, TIBC, IRON, RETICCTPCT in the last 72 hours. Urinalysis    Component Value Date/Time   COLORURINE YELLOW  10/02/2021 2020   APPEARANCEUR CLOUDY (A) 10/02/2021 2020   LABSPEC 1.022 10/02/2021 2020   PHURINE 5.0 10/02/2021 2020   GLUCOSEU >=500 (A) 10/02/2021 2020   GLUCOSEU 100 (A) 05/11/2017 1015   HGBUR SMALL (A) 10/02/2021 2020   BILIRUBINUR NEGATIVE 10/02/2021 2020   BILIRUBINUR Negative 06/16/2021 1216   KETONESUR 80 (A) 10/02/2021 2020   PROTEINUR 100 (A) 10/02/2021 2020   UROBILINOGEN 0.2 06/16/2021 1216   UROBILINOGEN 0.2 05/11/2017 1015   NITRITE NEGATIVE 10/02/2021 2020   LEUKOCYTESUR LARGE (A) 10/02/2021 2020   Sepsis Labs Invalid input(s): PROCALCITONIN,  WBC,  LACTICIDVEN Microbiology Recent Results (from the past 240 hour(s))  Blood Culture (routine x 2)     Status: None   Collection Time: 10/02/21  6:35 PM   Specimen: BLOOD LEFT FOREARM  Result Value Ref Range Status   Specimen Description BLOOD LEFT FOREARM  Final   Special Requests   Final    BOTTLES DRAWN AEROBIC AND ANAEROBIC Blood Culture adequate volume   Culture   Final    NO GROWTH 5 DAYS Performed at [facility], [address], [place], [place] [address]    Report Status 10/07/2021 FINAL  Final  Blood Culture (routine x 2)     Status: None   Collection Time: 10/02/21  6:40 PM   Specimen: BLOOD RIGHT WRIST  Result Value Ref Range Status   Specimen Description BLOOD RIGHT WRIST  Final   Special Requests   Final    BOTTLES DRAWN AEROBIC AND ANAEROBIC Blood Culture adequate volume   Culture   Final    NO GROWTH 5 DAYS Performed at [facility], [place] [address], [place],  [address]    Report Status 10/07/2021 FINAL  Final  Resp Panel by RT-PCR (Flu A&B, Covid) Anterior Nasal Swab     Status: None   Collection Time: 10/02/21  8:20 PM   Specimen: Anterior Nasal Swab  Result Value Ref Range Status   SARS Coronavirus 2 by RT PCR NEGATIVE NEGATIVE Final    Comment: (NOTE) SARS-CoV-2 target nucleic acids are NOT DETECTED.  The SARS-CoV-2 RNA is generally detectable in upper  respiratory specimens during the acute phase of infection. The lowest concentration of SARS-CoV-2 viral copies this assay can detect is 138 copies/mL. A negative result does not preclude SARS-Cov-2 infection and should not be used as the sole basis for treatment or other patient management decisions. A negative result may occur with  improper specimen collection/handling, submission of specimen other than nasopharyngeal swab, presence of viral mutation(s) within the areas targeted by this assay, and inadequate number of viral copies(<138 copies/mL). A negative result must be combined with clinical observations, patient history, and epidemiological information. The expected result is Negative.  Fact Sheet for Patients:  EntrepreneurPulse.com.au  Fact Sheet for Healthcare Providers:  IncredibleEmployment.be  This test is no t yet approved or cleared by the [place] FDA and  has been authorized for detection and/or diagnosis of SARS-CoV-2 by FDA under an Emergency Use Authorization (EUA). This EUA will remain  in effect (meaning this test can be used) for the duration of the COVID-19 declaration under Section 564(b)(1) of the Act, 21 U.S.C.section 360bbb-3(b)(1), unless the authorization is terminated  or revoked sooner.       Influenza A by PCR NEGATIVE NEGATIVE Final   Influenza B by PCR NEGATIVE NEGATIVE Final    Comment: (NOTE) The Xpert Xpress SARS-CoV-2/FLU/RSV plus assay is intended as an aid in the diagnosis of influenza from Nasopharyngeal swab specimens and should not be used as a sole basis for treatment. Nasal washings and aspirates are unacceptable for Xpert Xpress SARS-CoV-2/FLU/RSV testing.  Fact Sheet for Patients: EntrepreneurPulse.com.au  Fact Sheet for Healthcare Providers: IncredibleEmployment.be  This test is not yet approved or cleared by the [place] FDA and has been  authorized for detection and/or diagnosis of SARS-CoV-2 by FDA under an Emergency Use Authorization (EUA). This EUA will remain in effect (meaning this test can be used) for the duration of the COVID-19 declaration under Section 564(b)(1) of the Act, 21 U.S.C. section 360bbb-3(b)(1), unless the authorization is terminated or revoked.  Performed at [facility], [place] [name], [place], [place] [address]   Urine Culture     Status: Abnormal   Collection Time: 10/02/21  8:20 PM   Specimen: In/Out Cath Urine  Result Value Ref Range Status   Specimen Description IN/OUT CATH URINE  Final   Special Requests   Final    NONE Performed at [facility], [place] [address], [place], [place] [address]    Culture (A)  Final    >=100,000 COLONIES/mL LACTOBACILLUS SPECIES Standardized susceptibility testing for this organism is not available. >=100,000 COLONIES/mL YEAST    Report Status 10/04/2021 FINAL  Final    Please note: You were cared for by a hospitalist during your hospital stay. Once you are discharged, your primary care physician will handle any further medical issues. Please note that NO REFILLS for any discharge medications will be authorized once you are discharged, as it is imperative that you return to your primary care physician (or establish a relationship with a primary care physician if you do not have one) for your post hospital discharge needs so that they can reassess your need for medications and monitor your lab values.    Time coordinating discharge: 40 minutes  SIGNED:   Shelly Coss, MD  [facility] 10/07/2021, 10:41 AM Pager [phone]  If 7PM-7AM, please contact night-coverage [email or web address] Password [email or web address]

## 2021-10-11 ENCOUNTER — Telehealth: Payer: Self-pay

## 2021-10-11 DIAGNOSIS — L97312 Non-pressure chronic ulcer of right ankle with fat layer exposed: Secondary | ICD-10-CM | POA: Diagnosis not present

## 2021-10-11 NOTE — Telephone Encounter (Signed)
LVM 2nd attempt no answer

## 2021-10-12 ENCOUNTER — Ambulatory Visit: Payer: Medicare Other

## 2021-10-13 NOTE — Telephone Encounter (Signed)
Transition Care Management Unsuccessful Follow-up Telephone Call  Date of discharge and from where:  [facility], 10/07/2021  Attempts:  3rd Attempt  Reason for unsuccessful TCM follow-up call:  Unable to reach patient left message for patient to call have no received call back to office

## 2021-10-14 ENCOUNTER — Other Ambulatory Visit: Payer: Self-pay | Admitting: *Deleted

## 2021-10-14 DIAGNOSIS — E1165 Type 2 diabetes mellitus with hyperglycemia: Secondary | ICD-10-CM | POA: Diagnosis not present

## 2021-10-14 DIAGNOSIS — R1310 Dysphagia, unspecified: Secondary | ICD-10-CM | POA: Diagnosis not present

## 2021-10-14 DIAGNOSIS — K219 Gastro-esophageal reflux disease without esophagitis: Secondary | ICD-10-CM | POA: Diagnosis not present

## 2021-10-14 DIAGNOSIS — I679 Cerebrovascular disease, unspecified: Secondary | ICD-10-CM | POA: Diagnosis not present

## 2021-10-14 DIAGNOSIS — L97509 Non-pressure chronic ulcer of other part of unspecified foot with unspecified severity: Secondary | ICD-10-CM | POA: Diagnosis not present

## 2021-10-14 DIAGNOSIS — E11621 Type 2 diabetes mellitus with foot ulcer: Secondary | ICD-10-CM | POA: Diagnosis not present

## 2021-10-14 DIAGNOSIS — K5901 Slow transit constipation: Secondary | ICD-10-CM | POA: Diagnosis not present

## 2021-10-14 DIAGNOSIS — I2510 Atherosclerotic heart disease of native coronary artery without angina pectoris: Secondary | ICD-10-CM | POA: Diagnosis not present

## 2021-10-14 DIAGNOSIS — J449 Chronic obstructive pulmonary disease, unspecified: Secondary | ICD-10-CM | POA: Diagnosis not present

## 2021-10-14 DIAGNOSIS — F419 Anxiety disorder, unspecified: Secondary | ICD-10-CM | POA: Diagnosis not present

## 2021-10-14 DIAGNOSIS — I152 Hypertension secondary to endocrine disorders: Secondary | ICD-10-CM | POA: Diagnosis not present

## 2021-10-14 DIAGNOSIS — N179 Acute kidney failure, unspecified: Secondary | ICD-10-CM | POA: Diagnosis not present

## 2021-10-14 DIAGNOSIS — R5381 Other malaise: Secondary | ICD-10-CM | POA: Diagnosis not present

## 2021-10-14 DIAGNOSIS — Z794 Long term (current) use of insulin: Secondary | ICD-10-CM | POA: Diagnosis not present

## 2021-10-14 DIAGNOSIS — E118 Type 2 diabetes mellitus with unspecified complications: Secondary | ICD-10-CM | POA: Diagnosis not present

## 2021-10-14 NOTE — Patient Outreach (Signed)
Per [place] eligible member currently resides in [facility].  Screened for potential [facility] Care Management services as a benefit of member's insurance plan.  [name] admitted to SNF on after hospitalization for 10/07/21.  Secure communication sent to [facility] SW to make aware writer is following for transition plans and potential [facility] needs.   Will continue to follow while member resides in SNF.    [name] Rolling, MSN, RN,BSN [place] Acute Care Coordinator [phone] [facility]) [phone]  (Toll free office)

## 2021-10-15 DIAGNOSIS — G8929 Other chronic pain: Secondary | ICD-10-CM | POA: Diagnosis not present

## 2021-10-15 DIAGNOSIS — J449 Chronic obstructive pulmonary disease, unspecified: Secondary | ICD-10-CM | POA: Diagnosis not present

## 2021-10-15 DIAGNOSIS — M6281 Muscle weakness (generalized): Secondary | ICD-10-CM | POA: Diagnosis not present

## 2021-10-15 DIAGNOSIS — G9341 Metabolic encephalopathy: Secondary | ICD-10-CM | POA: Diagnosis not present

## 2021-10-15 DIAGNOSIS — R2681 Unsteadiness on feet: Secondary | ICD-10-CM | POA: Diagnosis not present

## 2021-10-15 DIAGNOSIS — M48061 Spinal stenosis, lumbar region without neurogenic claudication: Secondary | ICD-10-CM | POA: Diagnosis not present

## 2021-10-15 DIAGNOSIS — E43 Unspecified severe protein-calorie malnutrition: Secondary | ICD-10-CM | POA: Diagnosis not present

## 2021-10-17 DIAGNOSIS — F4320 Adjustment disorder, unspecified: Secondary | ICD-10-CM | POA: Diagnosis not present

## 2021-10-17 DIAGNOSIS — F411 Generalized anxiety disorder: Secondary | ICD-10-CM | POA: Diagnosis not present

## 2021-10-18 DIAGNOSIS — L97312 Non-pressure chronic ulcer of right ankle with fat layer exposed: Secondary | ICD-10-CM | POA: Diagnosis not present

## 2021-10-19 ENCOUNTER — Other Ambulatory Visit: Payer: Self-pay | Admitting: *Deleted

## 2021-10-19 DIAGNOSIS — E1142 Type 2 diabetes mellitus with diabetic polyneuropathy: Secondary | ICD-10-CM

## 2021-10-19 NOTE — Patient Outreach (Signed)
[place] Coordinator follow up. [facility] eligible member screened for potential [facility] Care Management services as a benefit of member's insurance plan.  Verified in [facility] Ms. Hepburn transitioned to home from [facility]. Telephone call made to Ms. Nicotra [phone] to discuss [facility] follow up. However, no answer. HIPAA compliant voicemail message left to request return call.   Secure message sent to [facility] to inquire about home health arrangements.   Will make referral to [place] for care coordination. Ms. Papa has medical history of diabetes type 2, COPD, GERD, stroke, status post pacemaker. Latest Hgb A1c 15.5.   Marthenia Rolling, MSN, RN,BSN [place] Acute Care Coordinator [phone] [facility]) [phone]  (Toll free office)

## 2021-10-20 ENCOUNTER — Other Ambulatory Visit: Payer: Self-pay

## 2021-10-20 ENCOUNTER — Other Ambulatory Visit: Payer: Self-pay | Admitting: *Deleted

## 2021-10-20 MED ORDER — STIOLTO RESPIMAT 2.5-2.5 MCG/ACT IN AERS: 2.0000 | INHALATION_SPRAY | Freq: Every day | RESPIRATORY_TRACT | 3 refills | Status: AC

## 2021-10-20 NOTE — Patient Outreach (Signed)
[place] [facility]) Care Management  10/20/2021  [name] [birth year] [id number]   Received hospital referral from [facility] for care management services. Assigned patient to Joellyn Quails, RN care coordinator for follow up.  [place] Management Assistant [phone]

## 2021-10-24 ENCOUNTER — Other Ambulatory Visit: Payer: Self-pay | Admitting: *Deleted

## 2021-10-24 NOTE — Patient Outreach (Signed)
[place] [facility]) Care Management Telephonic RN Care Manager Note   10/24/2021 Name:  [name] MRN:  [id number] DOB:  October 06, 1935  Summary: Follow up for post skilled nursing home discharge Discharge from [facility] on 10/18/21. Message sent to [place] to inquire about home health per [facility] staff [facility] [place]. Multiple comorbidities. DM with most recent Hgb A1c 15.5.   Initial outreach to patient who inquires what time is it when she answered. RN CM informed her She states she was sitting on couch and had fallen asleep Was unsure of the time of day but she was able to verify the correct day and month "They say I am strange" She reports she went grocery shopping with a female today    Patient is very talkative, rambles/speaking at a fast pace  She reports no support system, "no family" Living alone "no CAP worker" She informed RN CM she agreed only to go to [facility] for "three days" but when they kept her longer she became concern that her cats x 2 were not being feed and "insisted on leaving", "they called me a cab and watched me walk out"  She was not able to locate her discharge papers    She confirms no home health services arranged RN CM inquired if she had an appointment with Dr Sabra Heck, pcp and discussed the importance of getting an appointment with Dr Sabra Heck to obtain any orders for home health services or CAP services. She continued to interrupt RN CM to state Dr Sabra Heck "won't do it"   RN CM inquired if she wanted home health and CAP services and she confirmed she did Pt continued to states she does not know why no one tells her about medicaid transportation RN CM informed her x 3 in between of slight pauses that this RN CM discussed medicaid transportation with her in 2022 informed her that the medicaid transportation number was [id number]   Pt disconnected the line as she reports she was getting upset  Recommendations/Changes made from today's  visit: Started initial assessment to include transition of care services Medications, DME, follow appointments, home therapies, CAP Provided medicaid transportation  Discussed importance of follow MD appointments, CAP, home health services  Subjective: [name] is an 86 y.o. year old female who is a primary patient of Sabra Heck, Lillette Boxer, MD. The care management team was consulted for assistance with care management and/or care coordination needs.    Telephonic RN Care Manager completed Telephone Visit today.   Objective:  Medications Reviewed Today     Reviewed by Tollie Pizza, CPhT (Pharmacy Technician) on 10/03/21 at [place]  Med List Status: Home Meds Unknown   Medication Order Taking? Sig Documenting Provider Last Dose Status Informant  acetaminophen (TYLENOL) 325 MG tablet [id number]  Take 2 tablets (650 mg total) by mouth every 6 (six) hours as needed for mild pain (or Fever >/= 101). Domenic Polite, MD  Active Other  ALPRAZolam Duanne Moron) 0.5 MG tablet [id number]  Take 0.5 tablets (0.25 mg total) by mouth at bedtime as needed for up to 30 doses for sleep. Ngetich, Dinah C, NP  Active Other  clopidogrel (PLAVIX) 75 MG tablet [id number]  Take 1 tablet (75 mg total) by mouth daily. Krystalynn Friar, PA-C  Active   doxycycline (VIBRA-TABS) 100 MG tablet [id number]  Take 1 tablet (100 mg total) by mouth 2 (two) times daily for 10 days. Ngetich, Nelda Bucks, NP  Active   furosemide (  LASIX) 20 MG tablet [id number]  Take 1 tablet (20 mg total) by mouth daily.  Patient taking differently: Take 20 mg by mouth daily. When feet are swollen   Evans Lance, MD  Active Other  glucose blood (ACCU-CHEK AVIVA PLUS) test strip [id number]  Use to test blood sugar four times daily. Dx: E11.9 Wardell Honour, MD  Active Other  insulin aspart (NOVOLOG FLEXPEN) 100 UNIT/ML FlexPen [id number]  INJECT 12-14 UNITS UNDER THE SKIN AT BREAKFAST, 14-16 UNITS AT LUNCH, AND 12-16 UNITS AT DINNER.  Patient  taking differently: Inject 12-16 Units into the skin See admin instructions. 12-14 units at breakfast, 14-16 units at lunch, and 12-16 units at dinner   Lauree Chandler, NP  Active Other  insulin degludec (TRESIBA FLEXTOUCH) 200 UNIT/ML FlexTouch Pen [id number]  Inject 36 Units into the skin at bedtime. Lauree Chandler, NP  Active Other  ipratropium-albuterol (DUONEB) 0.5-2.5 (3) MG/3ML SOLN [id number]  1 vial in neb every 8 hours and as needed  Patient taking differently: Take 3 mLs by nebulization every 8 (eight) hours as needed (wheezing/shortness of breath).   Baird Lyons D, MD  Active Other  irbesartan (AVAPRO) 75 MG tablet [id number]  Take 75 mg by mouth daily. [provider]  Active Other           Med Note Thayer Ohm, Georgina Peer Oct 03, 2021  7:25 AM) No dispense hx in last 6 months  levalbuterol (XOPENEX HFA) 45 MCG/ACT inhaler [id number]  INHALE 1-2 PUFFS EVERY 6 HOURS IF NEEDED FOR WHEEZING.  Patient taking differently: Inhale 1-2 puffs into the lungs every 6 (six) hours as needed for wheezing.   Baird Lyons D, MD  Active Other  metoprolol succinate (TOPROL-XL) 50 MG 24 hr tablet [id number]  Take 50 mg by mouth 2 (two) times daily. [provider]  Active Other           Med Note Thayer Ohm, Georgina Peer Oct 03, 2021  7:26 AM) Last fill date 10.2022  mirabegron ER (MYRBETRIQ) 50 MG TB24 tablet [id number]  Take 1 tablet (50 mg total) by mouth daily. Wardell Honour, MD  Active   Multiple Vitamin (MULTIVITAMIN WITH MINERALS) TABS tablet [id number]  Take 1 tablet by mouth daily. Pokhrel, Laxman, MD  Active   pantoprazole (PROTONIX) 40 MG tablet [id number]  Take 1 tablet (40 mg total) by mouth daily. Pokhrel, Laxman, MD  Active   polyethylene glycol powder (GLYCOLAX/MIRALAX) 17 GM/SCOOP powder [id number]  Take 17 g by mouth daily. [provider]  Active Other  pravastatin (PRAVACHOL) 20 MG tablet [id number]  Take 1 tablet (20 mg total) by mouth daily. Wardell Honour, MD  Active Other  saccharomyces boulardii [facility]) 250 MG capsule [id number]  Take 1 capsule (250 mg total) by mouth 2 (two) times daily for 10 days. Ngetich, Dinah C, NP  Active   thiamine 100 MG tablet [id number]  Take 1 tablet (100 mg total) by mouth daily. Pokhrel, Laxman, MD  Active   Tiotropium Bromide-Olodaterol (STIOLTO RESPIMAT) 2.5-2.5 MCG/ACT AERS [id number]  Inhale 2 puffs into the lungs daily. Deneise Lever, MD  Active Other  VENTOLIN HFA [phone] Base) MCG/ACT inhaler [id number]  Inhale 2 puffs into the lungs every 6 (six) hours as needed for wheezing or shortness of breath. [provider]  Active   Med List Note Inez Catalina 10/03/21 8099): [place] [phone] - sends aide, but aide verbally  reminds pt to take medication, does not give them to her or manage meds             SDOH:  (Social Determinants of Health) assessments and interventions performed:  SDOH Interventions    Flowsheet Row Most Recent Value  SDOH Interventions   Financial Strain Interventions Intervention Not Indicated  Transportation Interventions Other (Comment)  [reviewed pt medicaid transportation [place]  Review of patient past medical history, allergies, medications, health status, including review of consultants reports, laboratory and other test data, was performed as part of comprehensive evaluation for care management services.   Care Plan : General Plan of Care (Adult)  Updates made by Barbaraann Faster, RN since 10/24/2021 12:00 AM  Completed 10/24/2021   Care Plan : RN Care Manager Plan of Care  Updates made by Barbaraann Faster, RN since 10/24/2021 12:00 AM     Problem: Complex Care Coordination Needs and disease management in patient with DM   Priority: High  Onset Date: 10/24/2021     Long-Range Goal: Establish Plan of Care for Management Complex SDOH Barriers, disease management and Care Coordination DM   Start Date:  10/24/2021  This Visit's Progress: Not on track  Priority: High  Note:   Current Barriers:  Knowledge Deficits related to plan of care for management of DMII  Care Coordination needs related to Lacks knowledge of community resource: medicaid transportation Barriers: No family, live alone, No CAP provider, rambles/talks at a fast pace/talkative. ? Hard of hearing 10/24/21 Pt disconnected the line as she reports she was getting upset  RN CM Clinical Goal(s):  Patient will verbalize understanding of plan for management of DMII as evidenced by lowered cbg values and/or lowered HgA1c  through collaboration with RN Care manager, provider, and care team.   Interventions: Outreaches for care coordination, disease management, resources, home care/education needs Inter-disciplinary care team collaboration (see longitudinal plan of care) Evaluation of current treatment plan related to  self management and patient's adherence to plan as established by provider 10/24/21 oriented pt to time- day time    Diabetes Interventions:  (Status:  New goal.) Long Term Goal Assessed patient's understanding of A1c goal: <7% Discussed plans with patient for ongoing care management follow up and provided patient with direct contact information for care management team Review of patient status, including review of consultants reports, relevant laboratory and other test results, and medications completed Screening for signs and symptoms of depression related to chronic disease state  Assessed social determinant of health barriers Lab Results  Component Value Date   HGBA1C >15.5 (H) 10/03/2021   Interdisciplinary Collaboration Interventions:  (Status: New goal.) Short Term Goal   Collaborated with RN to initiate plan of care to address needs related to Transportation in patient with DMII Collaboration with Sabra Heck, Lillette Boxer, MD prn RN CM inquired if she wanted home health and CAP services and she confirmed she  did RN CM informed her x 3 in between of slight pauses that this RN CM discussed medicaid transportation with her in 2022 informed her that the medicaid transportation number was [phone] RN CM inquired if she had an appointment with Dr Sabra Heck, pcp and discussed the importance of getting an appointment with Dr Sabra Heck to obtain any orders for home health services or CAP services.   Patient Goals/Self-Care Activities: Take all medications as prescribed Attend all scheduled provider appointments Attend church or other social activities Perform all self care  activities independently  Perform IADL's (shopping, preparing meals, housekeeping, managing finances) independently Call provider office for new concerns or questions  Work with the social worker to address care coordination needs and will continue to work with the clinical team to address health care and disease management related needs  Follow Up Plan:  The patient has been provided with contact information for the care management team and has been advised to call with any health related questions or concerns.  The care management team will reach out to the patient again over the next 30+ business days.       Plan: The patient has been provided with contact information for the care management team and has been advised to call with any health related questions or concerns.  The care management team will reach out to the patient again over the next 30+ business  days.  Treshaun Carrico L. Lavina Hamman, RN, BSN, [place] Coordinator Office number [phone] [facility] number [phone] Fax number [phone]

## 2021-10-27 ENCOUNTER — Ambulatory Visit (INDEPENDENT_AMBULATORY_CARE_PROVIDER_SITE_OTHER): Payer: Medicare Other | Admitting: Nurse Practitioner

## 2021-10-27 DIAGNOSIS — L97419 Non-pressure chronic ulcer of right heel and midfoot with unspecified severity: Secondary | ICD-10-CM | POA: Diagnosis not present

## 2021-10-27 DIAGNOSIS — E1165 Type 2 diabetes mellitus with hyperglycemia: Secondary | ICD-10-CM

## 2021-10-27 DIAGNOSIS — G3184 Mild cognitive impairment, so stated: Secondary | ICD-10-CM

## 2021-10-27 DIAGNOSIS — R350 Frequency of micturition: Secondary | ICD-10-CM | POA: Diagnosis not present

## 2021-10-27 DIAGNOSIS — E11621 Type 2 diabetes mellitus with foot ulcer: Secondary | ICD-10-CM | POA: Diagnosis not present

## 2021-10-27 NOTE — Progress Notes (Signed)
This service is provided via telemedicine  No vital signs collected/recorded due to the encounter was a telemedicine visit.   Location of patient (ex: home, work):  home  Patient consents to a telephone visit:  Yes  Location of the provider (ex: office, home):  [facility]  Name of any referring provider:  Sherrie Mustache, NP  Names of all persons participating in the telemedicine service and their role in the encounter:  Breleigh, Carpino Storey Stangeland, CMA and Sherrie Mustache, NP  Time spent on call:  10:26 Minutes.

## 2021-10-27 NOTE — Progress Notes (Addendum)
Careteam: Patient Care Team: Wardell Honour, MD as PCP - General (Family Medicine) Evans Lance, MD as PCP - Electrophysiology (Cardiology) Raynelle Bring, MD as Consulting Physician (Urology) Evans Lance, MD as Consulting Physician (Cardiology) Deneise Lever, MD as Consulting Physician (Pulmonary Disease) [facility] [name], MD as Consulting Physician (Endocrinology) Newt Minion, MD as Consulting Physician (Orthopedic Surgery) Raynelle Bring, MD as Consulting Physician (Urology) Barbaraann Faster, RN as [facility]  Advanced Directive information    Allergies  Allergen Reactions   Chlordiazepoxide-Clidinium Other (See Comments)    Sleepy,weak   Biaxin [Clarithromycin] Other (See Comments)    Foul taste, abd pain, diarrhea   Tramadol Other (See Comments)    Caused tremors   Codeine Other (See Comments)    Upset stomach    Chief Complaint  Patient presents with   Acute Visit    Complains of Urinary Issues. Urinating on herself and cannot stop.      HPI: Patient is a 86 y.o. female due to urinary frequency  She reports she is too scared to drive by herself.   She reports she is urinating all over herself and on the floor. She reports she is saturating her briefs.  Gets free briefs.  No pain with urination, has some burning and itching.  No discharge.  Reports she has been doing this for several days.  No abdominal pain.   Unable to see, reports caregiver says she has a rash on her bottom, was itching a few weeks ago but this has resolved. She had a cream applied during hospitalization.   She has not taken her blood sugar this morning.  She unable to recall her blood sugars. She does check and gives herself novolog with breakfast and supper.   She reports she is drinking juice daily at this time.   She lives alone with 2 cats, no children, reports she does not get the caregivers to help her  with her medication.   Reports she also has sores all over her feet. Some of them healing others do not.     Review of Systems:  Review of Systems  Constitutional:  Positive for weight loss. Negative for chills and fever.  HENT:  Negative for tinnitus.   Respiratory:  Negative for cough, sputum production and shortness of breath.   Cardiovascular:  Negative for chest pain, palpitations and leg swelling.  Gastrointestinal:  Negative for abdominal pain, constipation, diarrhea and heartburn.  Genitourinary:  Positive for frequency. Negative for dysuria and urgency.  Musculoskeletal:  Negative for back pain, falls, joint pain and myalgias.  Skin:  Positive for itching and rash.  Neurological:  Negative for dizziness and headaches.  Psychiatric/Behavioral:  Positive for memory loss. Negative for depression. The patient does not have insomnia.     Past Medical History:  Diagnosis Date   Acute ischemic stroke ([place]) 08/10/2021   Anxiety disorder, unspecified    Arthritis    Benign paroxysmal positional vertigo 08/09/2012   Chronic rhinitis    Cognitive communication deficit    Constipation, unspecified    COPD mixed type ([place])    on home O2   Diverticulosis    Esophageal stricture    Essential (primary) hypertension    Gastro-esophageal reflux disease without esophagitis    Glaucoma    Hiatal hernia    Hyperlipidemia, unspecified    Irritable bowel syndrome    Ischemic stroke ([place]) 08/09/2021   Presence of  permanent cardiac pacemaker 01/14/2020   Spinal stenosis, lumbar region, without neurogenic claudication    Steatohepatitis    Thrombocytopenia, unspecified ([place])    Type 2 diabetes mellitus with diabetic polyneuropathy ([place])    Past Surgical History:  Procedure Laterality Date   APPENDECTOMY     CARPAL TUNNEL RELEASE     right hand   CHOLECYSTECTOMY OPEN  1978   COLONOSCOPY  07-2001   mild diverticulosis   ESOPHAGOGASTRODUODENOSCOPY  [id number]   H Hernia,es.stricture s/p  dil 1F   INTRAOCULAR LENS INSERTION Bilateral    LIVER BIOPSY  [id number]   PACEMAKER IMPLANT N/A 01/14/2020   Procedure: PACEMAKER IMPLANT;  Surgeon: Evans Lance, MD;  Location: [facility];  Service: Cardiovascular;  Laterality: N/A;   PARATHYROID EXPLORATION     TONSILLECTOMY AND ADENOIDECTOMY     TOTAL ABDOMINAL HYSTERECTOMY     ULNAR NERVE TRANSPOSITION  12/07/2011   Procedure: ULNAR NERVE DECOMPRESSION/TRANSPOSITION;this was cancelled-not done  Surgeon: Cammie Sickle., MD;  Location: [facility];  Service: Orthopedics;  Laterality: Right;  right ulnar nerve in situ decompression   ULNAR TUNNEL RELEASE  03/07/2012   Procedure: CUBITAL TUNNEL RELEASE;  Surgeon: Roseanne Kaufman, MD;  Location: [facility];  Service: Orthopedics;  Laterality: Right;  ulnar nerve release at the elbow     Social History:   reports that she quit smoking about 33 years ago. Her smoking use included cigarettes. She has never used smokeless tobacco. She reports that she does not drink alcohol and does not use drugs.  Family History  Problem Relation Age of Onset   Heart disease Father    Lung cancer Mother        small cell;Byssinosis   Lung cancer Sister    Liver cancer Sister        ? mets from another area of the body   Diabetes Other        grandmother   Stroke Maternal Grandfather     Medications: Patient's Medications  New Prescriptions   No medications on file  Previous Medications   ACETAMINOPHEN (TYLENOL) 325 MG TABLET    Take 2 tablets (650 mg total) by mouth every 6 (six) hours as needed for mild pain (or Fever >/= 101).   ALPRAZOLAM (XANAX) 0.5 MG TABLET    Take 0.5 tablets (0.25 mg total) by mouth at bedtime as needed for up to 30 doses for sleep.   CLOPIDOGREL (PLAVIX) 75 MG TABLET    Take 1 tablet (75 mg total) by mouth daily.   FUROSEMIDE (LASIX) 20 MG TABLET    Take 1 tablet (20 mg total) by mouth daily.   GLUCOSE BLOOD (ACCU-CHEK AVIVA PLUS)  TEST STRIP    Use to test blood sugar four times daily. Dx: E11.9   INSULIN ASPART (NOVOLOG FLEXPEN) 100 UNIT/ML FLEXPEN    INJECT 12-14 UNITS UNDER THE SKIN AT BREAKFAST, 14-16 UNITS AT LUNCH, AND 12-16 UNITS AT DINNER.   INSULIN DEGLUDEC (TRESIBA FLEXTOUCH) 200 UNIT/ML FLEXTOUCH PEN    Inject 36 Units into the skin at bedtime.   IPRATROPIUM-ALBUTEROL (DUONEB) 0.5-2.5 (3) MG/3ML SOLN    1 vial in neb every 8 hours and as needed   IRBESARTAN (AVAPRO) 75 MG TABLET    Take 75 mg by mouth daily.   LEVALBUTEROL (XOPENEX HFA) 45 MCG/ACT INHALER    INHALE 1-2 PUFFS EVERY 6 HOURS IF NEEDED FOR WHEEZING.   METOPROLOL SUCCINATE (TOPROL-XL) 50 MG 24 HR TABLET  Take 50 mg by mouth 2 (two) times daily.   MIRABEGRON ER (MYRBETRIQ) 50 MG TB24 TABLET    Take 1 tablet (50 mg total) by mouth daily.   MULTIPLE VITAMIN (MULTIVITAMIN WITH MINERALS) TABS TABLET    Take 1 tablet by mouth daily.   PANTOPRAZOLE (PROTONIX) 40 MG TABLET    Take 1 tablet (40 mg total) by mouth daily.   POLYETHYLENE GLYCOL POWDER (GLYCOLAX/MIRALAX) 17 GM/SCOOP POWDER    Take 17 g by mouth daily.   PRAVASTATIN (PRAVACHOL) 20 MG TABLET    Take 1 tablet (20 mg total) by mouth daily.   THIAMINE 100 MG TABLET    Take 1 tablet (100 mg total) by mouth daily.   TIOTROPIUM BROMIDE-OLODATEROL (STIOLTO RESPIMAT) 2.5-2.5 MCG/ACT AERS    Inhale 2 puffs into the lungs daily.   VENTOLIN HFA 108 (90 BASE) MCG/ACT INHALER    Inhale 2 puffs into the lungs every 6 (six) hours as needed for wheezing or shortness of breath.  Modified Medications   No medications on file  Discontinued Medications   No medications on file    Physical Exam:  There were no vitals filed for this visit. There is no height or weight on file to calculate BMI. Wt Readings from Last 3 Encounters:  10/02/21 125 lb (56.7 kg)  09/23/21 134 lb 6.4 oz (61 kg)  09/14/21 135 lb 9.6 oz (61.5 kg)      Labs reviewed: Basic Metabolic Panel: Recent Labs    08/10/21 0438  08/13/21 0209 10/03/21 0329 10/03/21 0731 10/04/21 0714  NA  --    < > 140 141 138  K  --    < > 4.1 3.3* 3.8  CL  --    < > 106 107 105  CO2  --    < > 17* 26 29  GLUCOSE  --    < > 253* 156* 185*  BUN  --    < > 28* 23 12  CREATININE  --    < > 1.27* 0.92 0.77  CALCIUM  --    < > 9.2 8.8* 8.6*  MG  --   --  1.8  --   --   TSH 1.621  --   --   --   --    < > = values in this interval not displayed.   Liver Function Tests: Recent Labs    05/31/21 1119 08/09/21 1044 10/02/21 1835  AST 16 29 12*  ALT 16 18 12   ALKPHOS  --  50 62  BILITOT 0.6 0.8 1.8*  PROT 7.3 7.2 8.6*  ALBUMIN  --  3.8 4.2   Recent Labs    10/02/21 1835  LIPASE 24   Recent Labs    08/09/21 1315  AMMONIA 40*   CBC: Recent Labs    08/09/21 1044 08/09/21 1050 10/02/21 1835 10/02/21 2029 10/02/21 2030 10/03/21 0329 10/04/21 0714  WBC 7.7   < > 12.4*  --   --  11.8* 6.3  NEUTROABS 5.5  --  10.5*  --   --  8.2*  --   HGB 15.6*   < > 18.8*   < > 18.4* 17.0* 14.7  HCT 48.2*   < > 61.6*   < > 54.0* 52.5* 46.1*  MCV 88.9   < > 94.5  --   --  89.3 88.8  PLT 150   < > 209  --   --  188 135*   < > =  values in this interval not displayed.   Lipid Panel: Recent Labs    05/31/21 1119 08/09/21 1315  CHOL 125 90  HDL 55 38*  LDLCALC 48 42  TRIG 135 50  CHOLHDL 2.3 2.4   TSH: Recent Labs    08/10/21 0438  TSH 1.621   A1C: Lab Results  Component Value Date   HGBA1C >15.5 (H) 10/03/2021     Assessment/Plan 1. Diabetic ulcer of right midfoot associated with type 2 diabetes mellitus, unspecified ulcer stage ([place]) -reports she still has foot ulcers that are healing. She is unsure of the wound care being done. She does not have any home health at this time. Would benefit from home health referral.   2. Uncontrolled type 2 diabetes mellitus with hyperglycemia ([place]) She does not recall blood sugars at this time. Her memory is definitely a barrier. Discussed with her about medication  management and when she should be taking her insulin- she has long and short acting. She reported to CMA forgetting long acting insulin at time- a1c greater than 15 in May so medication compliance is an issue.  -dietary recommendations also given.   3. Urinary frequency She was prescribed myrbetriq while she was in office but has not starting taking. She reports she does have medication bottle and will start to take. -she was recently treated for UTI during May 28th hospitalization.  4. Mild cognitive impairment with memory loss -she reports she does not have children and does not trust other caregivers. Cognitive impairment is playing a role in her compliance and follow up. She is planning to come into office next week- request morning appt. She states she will get ride to this appt. Educated to bring all medication bottles and blood sugar readings to appt.    Next appt: 10/31/2021 [name]. Harle Battiest  [facility] [phone]    Virtual Visit via telephone  I connected with patient on 10/27/21 at 10:00 AM EDT by telephone and verified that I am speaking with the correct person using two identifiers.  Location: Patient: home Provider: [facility]   I discussed the limitations, risks, security and privacy concerns of performing an evaluation and management service by telephone and the availability of in person appointments. I also discussed with the patient that there may be a patient responsible charge related to this service. The patient expressed understanding and agreed to proceed.   I discussed the assessment and treatment plan with the patient. The patient was provided an opportunity to ask questions and all were answered. The patient agreed with the plan and demonstrated an understanding of the instructions.   The patient was advised to call back or seek an in-person evaluation if the symptoms worsen or if the condition fails to improve as  anticipated.  I provided 24 minutes of non-face-to-face time during this encounter.  [name]. Harle Battiest Avs printed and mailed

## 2021-10-28 ENCOUNTER — Encounter (HOSPITAL_COMMUNITY): Payer: Self-pay | Admitting: *Deleted

## 2021-10-28 ENCOUNTER — Telehealth (HOSPITAL_COMMUNITY): Payer: Self-pay | Admitting: Emergency Medicine

## 2021-10-28 ENCOUNTER — Telehealth: Payer: Self-pay

## 2021-10-28 ENCOUNTER — Other Ambulatory Visit: Payer: Self-pay

## 2021-10-28 ENCOUNTER — Emergency Department (HOSPITAL_COMMUNITY): Payer: Medicare Other

## 2021-10-28 ENCOUNTER — Emergency Department (HOSPITAL_COMMUNITY)
Admission: EM | Admit: 2021-10-28 | Discharge: 2021-10-28 | Disposition: A | Discharge status: Home / Self Care | Payer: Medicare Other | Attending: Emergency Medicine | Admitting: Emergency Medicine

## 2021-10-28 DIAGNOSIS — J449 Chronic obstructive pulmonary disease, unspecified: Secondary | ICD-10-CM | POA: Insufficient documentation

## 2021-10-28 DIAGNOSIS — R000 Tachycardia, unspecified: Secondary | ICD-10-CM | POA: Diagnosis not present

## 2021-10-28 DIAGNOSIS — Z7902 Long term (current) use of antithrombotics/antiplatelets: Secondary | ICD-10-CM | POA: Insufficient documentation

## 2021-10-28 DIAGNOSIS — L03115 Cellulitis of right lower limb: Secondary | ICD-10-CM

## 2021-10-28 DIAGNOSIS — L97519 Non-pressure chronic ulcer of other part of right foot with unspecified severity: Secondary | ICD-10-CM | POA: Diagnosis not present

## 2021-10-28 DIAGNOSIS — R739 Hyperglycemia, unspecified: Secondary | ICD-10-CM | POA: Diagnosis not present

## 2021-10-28 DIAGNOSIS — E1165 Type 2 diabetes mellitus with hyperglycemia: Secondary | ICD-10-CM | POA: Diagnosis not present

## 2021-10-28 DIAGNOSIS — Z794 Long term (current) use of insulin: Secondary | ICD-10-CM | POA: Insufficient documentation

## 2021-10-28 DIAGNOSIS — I10 Essential (primary) hypertension: Secondary | ICD-10-CM | POA: Insufficient documentation

## 2021-10-28 LAB — CBC WITH DIFFERENTIAL/PLATELET
Abs Immature Granulocytes: 0.03 10*3/uL (ref 0.00–0.07)
Basophils Absolute: 0 10*3/uL (ref 0.0–0.1)
Basophils Relative: 1 %
Eosinophils Absolute: 0.1 10*3/uL (ref 0.0–0.5)
Eosinophils Relative: 1 %
HCT: 46.5 % — ABNORMAL HIGH (ref 36.0–46.0)
Hemoglobin: 14.9 g/dL (ref 12.0–15.0)
Immature Granulocytes: 0 %
Lymphocytes Relative: 22 %
Lymphs Abs: 1.5 10*3/uL (ref 0.7–4.0)
MCH: 29.3 pg (ref 26.0–34.0)
MCHC: 32 g/dL (ref 30.0–36.0)
MCV: 91.5 fL (ref 80.0–100.0)
Monocytes Absolute: 0.3 10*3/uL (ref 0.1–1.0)
Monocytes Relative: 4 %
Neutro Abs: 5 10*3/uL (ref 1.7–7.7)
Neutrophils Relative %: 72 %
Platelets: 189 10*3/uL (ref 150–400)
RBC: 5.08 MIL/uL (ref 3.87–5.11)
RDW: 14.2 % (ref 11.5–15.5)
WBC: 7 10*3/uL (ref 4.0–10.5)
nRBC: 0 % (ref 0.0–0.2)

## 2021-10-28 LAB — I-STAT VENOUS BLOOD GAS, ED
Acid-Base Excess: 2 mmol/L (ref 0.0–2.0)
Bicarbonate: 27.9 mmol/L (ref 20.0–28.0)
Calcium, Ion: 1.11 mmol/L — ABNORMAL LOW (ref 1.15–1.40)
HCT: 47 % — ABNORMAL HIGH (ref 36.0–46.0)
Hemoglobin: 16 g/dL — ABNORMAL HIGH (ref 12.0–15.0)
O2 Saturation: 66 %
Potassium: 4.7 mmol/L (ref 3.5–5.1)
Sodium: 133 mmol/L — ABNORMAL LOW (ref 135–145)
TCO2: 29 mmol/L (ref 22–32)
pCO2, Ven: 45.7 mmHg (ref 44–60)
pH, Ven: 7.395 (ref 7.25–7.43)
pO2, Ven: 35 mmHg (ref 32–45)

## 2021-10-28 LAB — COMPREHENSIVE METABOLIC PANEL
ALT: 9 U/L (ref 0–44)
AST: 14 U/L — ABNORMAL LOW (ref 15–41)
Albumin: 3.4 g/dL — ABNORMAL LOW (ref 3.5–5.0)
Alkaline Phosphatase: 52 U/L (ref 38–126)
Anion gap: 14 (ref 5–15)
BUN: 8 mg/dL (ref 8–23)
CO2: 26 mmol/L (ref 22–32)
Calcium: 9.5 mg/dL (ref 8.9–10.3)
Chloride: 96 mmol/L — ABNORMAL LOW (ref 98–111)
Creatinine, Ser: 0.83 mg/dL (ref 0.44–1.00)
GFR, Estimated: >60 mL/min (ref >60)
Glucose, Bld: 455 mg/dL — ABNORMAL HIGH (ref 70–99)
Potassium: 4.9 mmol/L (ref 3.5–5.1)
Sodium: 136 mmol/L (ref 135–145)
Total Bilirubin: 0.9 mg/dL (ref 0.3–1.2)
Total Protein: 6.7 g/dL (ref 6.5–8.1)

## 2021-10-28 LAB — URINALYSIS, ROUTINE W REFLEX MICROSCOPIC
Bilirubin Urine: NEGATIVE
Glucose, UA: >=500 mg/dL — AB
Ketones, ur: 80 mg/dL — AB
Nitrite: NEGATIVE
Protein, ur: NEGATIVE mg/dL
Specific Gravity, Urine: 1.036 — ABNORMAL HIGH (ref 1.005–1.030)
WBC, UA: >50 WBC/hpf — ABNORMAL HIGH (ref 0–5)
pH: 5 (ref 5.0–8.0)

## 2021-10-28 LAB — CBG MONITORING, ED
Glucose-Capillary: 426 mg/dL — ABNORMAL HIGH (ref 70–99)
Glucose-Capillary: 294 mg/dL — ABNORMAL HIGH (ref 70–99)

## 2021-10-28 LAB — BETA-HYDROXYBUTYRIC ACID: Beta-Hydroxybutyric Acid: 3.33 mmol/L — ABNORMAL HIGH (ref 0.05–0.27)

## 2021-10-28 MED ORDER — INSULIN ASPART 100 UNIT/ML IJ SOLN
5.0000 [IU] | Freq: Once | INTRAMUSCULAR | Status: AC
  Administered 2021-10-28: 5 [IU] via INTRAVENOUS

## 2021-10-28 MED ORDER — LACTATED RINGERS IV BOLUS
1000.0000 mL | Freq: Once | INTRAVENOUS | Status: AC
  Administered 2021-10-28: 1000 mL via INTRAVENOUS

## 2021-10-28 MED ORDER — DOXYCYCLINE HYCLATE 100 MG PO CAPS: 100.0000 mg | ORAL_CAPSULE | Freq: Two times a day (BID) | ORAL | 0 refills | Status: AC

## 2021-10-28 MED ORDER — DOXYCYCLINE HYCLATE 100 MG PO TABS
100.0000 mg | ORAL_TABLET | Freq: Once | ORAL | Status: AC
  Administered 2021-10-28: 100 mg via ORAL
  Filled 2021-10-28: qty 1

## 2021-10-28 MED ORDER — CEPHALEXIN 500 MG PO CAPS: 500.0000 mg | ORAL_CAPSULE | Freq: Three times a day (TID) | ORAL | 0 refills | Status: AC

## 2021-10-28 MED ORDER — FLUCONAZOLE 200 MG PO TABS: 200.0000 mg | ORAL_TABLET | Freq: Once | ORAL | 0 refills | Status: AC

## 2021-10-28 NOTE — ED Provider Notes (Signed)
[facility] EMERGENCY DEPARTMENT Provider Note   CSN: [id number] Arrival date & time: 10/28/21  1124     History  Chief Complaint  Patient presents with   Hyperglycemia    [name] is a 86 y.o. female.  HPI    86 year old female with a history of type 2 diabetes, COPD on 1.5 L of home oxygen, CVA, admission from May 28 to June 2 with concern for DKA and urinary tract infection, history of complete heart block status post pacemaker, admission for CVA in April 2023, who presents with concern for hyperglycemia.  Reports that she was feeling well yesterday, and is not sure if she took her insulin, and this morning found that her sugar was elevated to the 500s.  She called her doctor who recommended she come to the emergency department.  She denies any associated acute symptoms.  Reports that since she left the hospital, she has had some shakiness but that is unchanged.  Denies new numbness, weakness, trouble talking, trouble walking, visual changes, vomiting, diarrhea, black or bloody stools, chest pain, shortness of breath, cough, urinary symptoms.   Reports she has had wounds to her lower extremities, one you "used to be able to put a finger through" on lateral side of right foot and another that developed over thte last week to her right medial foot  She lives alone with her cats.  She does not trust caregivers and does not have anyone else.  She reports she has been taking her insulin but reports she may have missed a day or two.     Past Medical History:  Diagnosis Date   Acute ischemic stroke (HCC) 08/10/2021   Anxiety disorder, unspecified    Arthritis    Benign paroxysmal positional vertigo 08/09/2012   Chronic rhinitis    Cognitive communication deficit    Constipation, unspecified    COPD mixed type (H[facility])    on home O2   Diverticulosis    Esophageal stricture    Essential (primary) hypertension    Gastro-esophageal reflux disease without  esophagitis    Glaucoma    Hiatal hernia    Hyperlipidemia, unspecified    Irritable bowel syndrome    Ischemic stroke (H[facility]) 08/09/2021   Presence of permanent cardiac pacemaker 01/14/2020   Spinal stenosis, lumbar region, without neurogenic claudication    Steatohepatitis    Thrombocytopenia, unspecified (H[facility])    Type 2 diabetes mellitus with diabetic polyneuropathy ([facility])      Home Medications Prior to Admission medications   Medication Sig Start Date End Date Taking? Authorizing Provider  cephALEXin (KEFLEX) 500 MG capsule Take 1 capsule (500 mg total) by mouth 3 (three) times daily for 7 days. 10/28/21 11/04/21  Alvira Monday, MD  doxycycline (VIBRAMYCIN) 100 MG capsule Take 1 capsule (100 mg total) by mouth 2 (two) times daily for 7 days. 10/28/21 11/04/21 Yes Alvira Monday, MD  acetaminophen (TYLENOL) 325 MG tablet Take 2 tablets (650 mg total) by mouth every 6 (six) hours as needed for mild pain (or Fever >/= 101). 01/23/20   Zannie Cove, MD  ALPRAZolam Prudy Feeler) 0.5 MG tablet Take 0.5 tablets (0.25 mg total) by mouth at bedtime as needed for up to 30 doses for sleep. 10/07/21   Burnadette Pop, MD  busPIRone (BUSPAR) 5 MG tablet Take 5 mg by mouth 3 (three) times daily.    [provider]  clopidogrel (PLAVIX) 75 MG tablet Take 1 tablet (75 mg total) by mouth daily. 09/14/21  Graciella Freer, PA-C  fluconazole (DIFLUCAN) 200 MG tablet Take 1 tablet (200 mg total) by mouth once for 1 dose. 10/28/21 10/28/21  Alvira Monday, MD  fluticasone (FLOVENT HFA) 110 MCG/ACT inhaler Inhale 1 puff into the lungs 2 (two) times daily.    [provider]  furosemide (LASIX) 20 MG tablet Take 1 tablet (20 mg total) by mouth daily. 10/07/21 10/02/22  Burnadette Pop, MD  glucose blood (ACCU-CHEK AVIVA PLUS) test strip Use to test blood sugar four times daily. Dx: E11.9 03/07/21   [name], MD  insulin aspart (NOVOLOG FLEXPEN) 100 UNIT/ML FlexPen INJECT 12-14 UNITS UNDER  THE SKIN AT BREAKFAST, 14-16 UNITS AT LUNCH, AND 12-16 UNITS AT DINNER. Patient taking differently: Inject 12-16 Units into the skin See admin instructions. 12-14 units at breakfast, 14-16 units at lunch, and 12-16 units at dinner 12/08/20   Sharon Seller, NP  insulin degludec (TRESIBA FLEXTOUCH) 200 UNIT/ML FlexTouch Pen Inject 36 Units into the skin at bedtime. 11/18/20   Sharon Seller, NP  ipratropium-albuterol (DUONEB) 0.5-2.5 (3) MG/3ML SOLN 1 vial in neb every 8 hours and as needed Patient taking differently: Take 3 mLs by nebulization every 8 (eight) hours as needed (wheezing/shortness of breath). 01/17/21   Waymon Budge, MD  irbesartan (AVAPRO) 75 MG tablet Take 75 mg by mouth daily.    [provider]  levalbuterol (XOPENEX HFA) 45 MCG/ACT inhaler INHALE 1-2 PUFFS EVERY 6 HOURS IF NEEDED FOR WHEEZING. Patient taking differently: Inhale 1-2 puffs into the lungs every 6 (six) hours as needed for wheezing. 05/31/21   Waymon Budge, MD  metoprolol succinate (TOPROL-XL) 50 MG 24 hr tablet Take 50 mg by mouth 2 (two) times daily.    [provider]  mirabegron ER (MYRBETRIQ) 50 MG TB24 tablet Take 1 tablet (50 mg total) by mouth daily. 09/16/21   [name], MD  Multiple Vitamin (MULTIVITAMIN WITH MINERALS) TABS tablet Take 1 tablet by mouth daily. 08/14/21   Pokhrel, Rebekah Chesterfield, MD  pantoprazole (PROTONIX) 40 MG tablet Take 1 tablet (40 mg total) by mouth daily. 08/13/21 08/13/22  Pokhrel, Rebekah Chesterfield, MD  polyethylene glycol powder (GLYCOLAX/MIRALAX) 17 GM/SCOOP powder Take 17 g by mouth daily.    [provider]  pravastatin (PRAVACHOL) 20 MG tablet Take 1 tablet (20 mg total) by mouth daily. 02/28/21   [name], MD  thiamine 100 MG tablet Take 1 tablet (100 mg total) by mouth daily. 08/14/21   Pokhrel, Rebekah Chesterfield, MD  Tiotropium Bromide-Olodaterol (STIOLTO RESPIMAT) 2.5-2.5 MCG/ACT AERS Inhale 2 puffs into the lungs daily. 10/20/21   Waymon Budge, MD   VENTOLIN HFA 108 (90 Base) MCG/ACT inhaler Inhale 2 puffs into the lungs every 6 (six) hours as needed for wheezing or shortness of breath. 08/24/21   [provider]      Allergies    Chlordiazepoxide-clidinium, Biaxin [clarithromycin], Tramadol, and Codeine    Review of Systems   Review of Systems  Physical Exam Updated Vital Signs BP (!) 157/71   Pulse (!) 107   Temp 98.5 F (36.9 C) (Oral)   Resp (!) 23   Ht 5\' 3"  (1.6 m)   Wt 58.1 kg   SpO2 93%   BMI 22.67 kg/m  Physical Exam Vitals and nursing note reviewed.  Constitutional:      General: She is not in acute distress.    Appearance: She is well-developed. She is not diaphoretic.  HENT:     Head: Normocephalic  and atraumatic.  Eyes:     Conjunctiva/sclera: Conjunctivae normal.  Cardiovascular:     Rate and Rhythm: Normal rate and regular rhythm.     Heart sounds: Normal heart sounds. No murmur heard.    No friction rub. No gallop.  Pulmonary:     Effort: Pulmonary effort is normal. No respiratory distress.     Breath sounds: Normal breath sounds. No wheezing or rales.  Abdominal:     General: There is no distension.     Palpations: Abdomen is soft.     Tenderness: There is no abdominal tenderness. There is no guarding.  Musculoskeletal:        General: No tenderness.     Cervical back: Normal range of motion.  Skin:    General: Skin is warm and dry.     Findings: No erythema or rash.  Neurological:     Mental Status: She is alert and oriented to person, place, and time.     ED Results / Procedures / Treatments   Labs (all labs ordered are listed, but only abnormal results are displayed) Labs Reviewed  CBC WITH DIFFERENTIAL/PLATELET - Abnormal; Notable for the following components:      Result Value   HCT 46.5 (*)    All other components within normal limits  COMPREHENSIVE METABOLIC PANEL - Abnormal; Notable for the following components:   Chloride 96 (*)    Glucose, Bld 455 (*)    Albumin  3.4 (*)    AST 14 (*)    All other components within normal limits  BETA-HYDROXYBUTYRIC ACID - Abnormal; Notable for the following components:   Beta-Hydroxybutyric Acid 3.33 (*)    All other components within normal limits  URINALYSIS, ROUTINE W REFLEX MICROSCOPIC - Abnormal; Notable for the following components:   APPearance CLOUDY (*)    Specific Gravity, Urine 1.036 (*)    Glucose, UA >=500 (*)    Hgb urine dipstick SMALL (*)    Ketones, ur 80 (*)    Leukocytes,Ua LARGE (*)    WBC, UA >50 (*)    Bacteria, UA MANY (*)    All other components within normal limits  CBG MONITORING, ED - Abnormal; Notable for the following components:   Glucose-Capillary 426 (*)    All other components within normal limits  I-STAT VENOUS BLOOD GAS, ED - Abnormal; Notable for the following components:   Sodium 133 (*)    Calcium, Ion 1.11 (*)    HCT 47.0 (*)    Hemoglobin 16.0 (*)    All other components within normal limits  CBG MONITORING, ED - Abnormal; Notable for the following components:   Glucose-Capillary 294 (*)    All other components within normal limits  I-STAT CHEM 8, ED    EKG EKG Interpretation  Date/Time:  Friday October 28 2021 11:36:27 EDT Ventricular Rate:  120 PR Interval:  45 QRS Duration: 149 QT Interval:  397 QTC Calculation: 561 R Axis:   -81 Text Interpretation: Ventricular-paced rhythm No further analysis attempted due to paced rhythm No significant change since last tracing Confirmed by Alvira Monday ([id number]) on 10/28/2021 12:41:21 PM  Radiology DG Foot Complete Right  Result Date: 10/28/2021 CLINICAL DATA:  ulcer EXAM: RIGHT FOOT COMPLETE - 3+ VIEW COMPARISON:  None Available. FINDINGS: There is no evidence of fracture or dislocation. There is no evidence of arthropathy or other focal bone abnormality. There appears to be a soft tissue defect/ulcer at the medial aspect of the mid to hindfoot. IMPRESSION: There  appears to be an ulcer at the medial mid-hindfoot.  Osseous structures have a normal appearance. Electronically Signed   By: Marjo Bicker M.D.   On: 10/28/2021 14:16    Procedures Procedures    Medications Ordered in ED Medications  lactated ringers bolus 1,000 mL ( Intravenous Stopped 10/28/21 1611)  insulin aspart (novoLOG) injection 5 Units (5 Units Intravenous Given 10/28/21 1457)  doxycycline (VIBRA-TABS) tablet 100 mg (100 mg Oral Given 10/28/21 1636)    ED Course/ Medical Decision Making/ A&P                           Medical Decision Making Amount and/or Complexity of Data Reviewed Labs: ordered. Radiology: ordered.  Risk Prescription drug management.   86 year old female with a history of type 2 diabetes, COPD on 1.5 L of home oxygen, CVA, admission from May 28 to June 2 with concern for DKA and urinary tract infection, history of complete heart block status post pacemaker, admission for CVA in April 2023, who presents with concern for hyperglycemia.  Labs were completed and personally evaluated by me at this time show no evidence of acidosis, no anion gap, normal bicarb, with hyperglycemia. No significant anemia.  Glucose initially above 400, improved with fluid and insulin. She does admit to missing a dose of her long acting insulin, but is otherwise able to tell me what she typically takes. She has no altered mental status, no sigh of HHS.    Does have wounds on her feet for which she is being seen as an outpatient.  No evidence of acute abscess. Doubt DVT/PE.  Normal appearing bones on XR, normal pulses.  Will treat for cellulitis with doxycycline.  UA returned after discharge and shows possible infection, sent keflex and yeast medications.  Considered admission however her glucose has improved and do not see signs of diabetic emergency or severe infection at this time.  Discussed strict return precautions. Patient discharged in stable condition with understanding of reasons to return.  She has a PCP follow up on  Monday.           Final Clinical Impression(s) / ED Diagnoses Final diagnoses:  Hyperglycemia  Cellulitis of right lower extremity    Rx / DC Orders ED Discharge Orders          Ordered    doxycycline (VIBRAMYCIN) 100 MG capsule  2 times daily        10/28/21 1536              Alvira Monday, MD 10/28/21 2251

## 2021-10-31 ENCOUNTER — Other Ambulatory Visit: Payer: Self-pay | Admitting: *Deleted

## 2021-10-31 ENCOUNTER — Ambulatory Visit (INDEPENDENT_AMBULATORY_CARE_PROVIDER_SITE_OTHER): Payer: Medicare Other | Admitting: Family

## 2021-10-31 ENCOUNTER — Telehealth: Payer: Self-pay | Admitting: *Deleted

## 2021-10-31 ENCOUNTER — Encounter: Payer: Self-pay | Admitting: Family

## 2021-10-31 VITALS — BP 120/68 | HR 64 | Temp 97.3°F | Resp 24 | Ht 63.0 in | Wt 128.0 lb

## 2021-10-31 DIAGNOSIS — I10 Essential (primary) hypertension: Secondary | ICD-10-CM | POA: Diagnosis not present

## 2021-10-31 DIAGNOSIS — J449 Chronic obstructive pulmonary disease, unspecified: Secondary | ICD-10-CM | POA: Diagnosis not present

## 2021-10-31 DIAGNOSIS — L602 Onychogryphosis: Secondary | ICD-10-CM

## 2021-10-31 DIAGNOSIS — F0390 Unspecified dementia without behavioral disturbance: Secondary | ICD-10-CM

## 2021-10-31 DIAGNOSIS — I639 Cerebral infarction, unspecified: Secondary | ICD-10-CM | POA: Diagnosis not present

## 2021-10-31 DIAGNOSIS — E11621 Type 2 diabetes mellitus with foot ulcer: Secondary | ICD-10-CM

## 2021-10-31 DIAGNOSIS — L97419 Non-pressure chronic ulcer of right heel and midfoot with unspecified severity: Secondary | ICD-10-CM | POA: Diagnosis not present

## 2021-10-31 DIAGNOSIS — R2681 Unsteadiness on feet: Secondary | ICD-10-CM | POA: Diagnosis not present

## 2021-10-31 DIAGNOSIS — E1165 Type 2 diabetes mellitus with hyperglycemia: Secondary | ICD-10-CM | POA: Diagnosis not present

## 2021-10-31 NOTE — Progress Notes (Signed)
Provider: Shevy Yaney FNP-C  Wardell Honour, MD  Patient Care Team: Wardell Honour, MD as PCP - General (Family Medicine) Evans Lance, MD as PCP - Electrophysiology (Cardiology) Raynelle Bring, MD as Consulting Physician (Urology) Evans Lance, MD as Consulting Physician (Cardiology) Deneise Lever, MD as Consulting Physician (Pulmonary Disease) [facility] [name], MD as Consulting Physician (Endocrinology) Newt Minion, MD as Consulting Physician (Orthopedic Surgery) Raynelle Bring, MD as Consulting Physician (Urology) Barbaraann Faster, RN as [place] [facility]  No emergency contact information on file.  Code Status:  Full Code  Goals of care: Advanced Directive information    10/28/2021   11:54 AM  Advanced Directives  Does Patient Have a Medical Advance Directive? No     Chief Complaint  Patient presents with   Hospitalization Follow-up    Patient is here for a follow up after an ED visit.  PT C/O of sore leg and incontinence. Medication management per Janett Billow. Patient on 1.5-2.0 L oxygen, no Blood sugar reading this morning, did not take bp meds for 2 days    HPI:  Pt is a 86 y.o. female seen today for an acute visit for follow up ED visit 10/28/2021 for hyperglycemia.CBG was 426.states her blood sugar at home was 500's.Has been missing her insulin.  States forgets to take her insulin at times. She complains of right foot wound that she is unable to to manage.  States he used to put a bandage over it but seems to be draining. No recent falls but states she has been weak and she lives by herself.  Used to have caregivers but they no longer, unclear why.  Has no other relatives to help with her care.  Unable to take showers but has been doing sponge bath.  She is incontinent for urine.   Past Medical History:  Diagnosis Date   Acute ischemic stroke ([place]) 08/10/2021   Anxiety disorder, unspecified     Arthritis    Benign paroxysmal positional vertigo 08/09/2012   Chronic rhinitis    Cognitive communication deficit    Constipation, unspecified    COPD mixed type ([place])    on home O2   Diverticulosis    Esophageal stricture    Essential (primary) hypertension    Gastro-esophageal reflux disease without esophagitis    Glaucoma    Hiatal hernia    Hyperlipidemia, unspecified    Irritable bowel syndrome    Ischemic stroke ([place]) 08/09/2021   Presence of permanent cardiac pacemaker 01/14/2020   Spinal stenosis, lumbar region, without neurogenic claudication    Steatohepatitis    Thrombocytopenia, unspecified ([place])    Type 2 diabetes mellitus with diabetic polyneuropathy ([place])    Past Surgical History:  Procedure Laterality Date   APPENDECTOMY     CARPAL TUNNEL RELEASE     right hand   CHOLECYSTECTOMY OPEN  1978   COLONOSCOPY  07-2001   mild diverticulosis   ESOPHAGOGASTRODUODENOSCOPY  1062,69-48   H Hernia,es.stricture s/p dil 70F   INTRAOCULAR LENS INSERTION Bilateral    LIVER BIOPSY  [id number]   PACEMAKER IMPLANT N/A 01/14/2020   Procedure: PACEMAKER IMPLANT;  Surgeon: Evans Lance, MD;  Location: [facility];  Service: Cardiovascular;  Laterality: N/A;   PARATHYROID EXPLORATION     TONSILLECTOMY AND ADENOIDECTOMY     TOTAL ABDOMINAL HYSTERECTOMY     ULNAR NERVE TRANSPOSITION  12/07/2011   Procedure: ULNAR NERVE DECOMPRESSION/TRANSPOSITION;this was cancelled-not done  Surgeon: Robert V Sypher Jr., MD;  Location: [facility];  Service: Orthopedics;  Laterality: Right;  right ulnar nerve in situ decompression   ULNAR TUNNEL RELEASE  03/07/2012   Procedure: CUBITAL TUNNEL RELEASE;  Surgeon: William Gramig, MD;  Location: [facility];  Service: Orthopedics;  Laterality: Right;  ulnar nerve release at the elbow      Allergies  Allergen Reactions   Chlordiazepoxide-Clidinium Other (See Comments)    Sleepy,weak   Biaxin [Clarithromycin] Other (See  Comments)    Foul taste, abd pain, diarrhea   Tramadol Other (See Comments)    Caused tremors   Codeine Other (See Comments)    Upset stomach    Outpatient Encounter Medications as of 10/31/2021  Medication Sig   cephALEXin (KEFLEX) 500 MG capsule Take 1 capsule (500 mg total) by mouth 3 (three) times daily for 7 days.   acetaminophen (TYLENOL) 325 MG tablet Take 2 tablets (650 mg total) by mouth every 6 (six) hours as needed for mild pain (or Fever >/= 101).   ALPRAZolam (XANAX) 0.5 MG tablet Take 0.5 tablets (0.25 mg total) by mouth at bedtime as needed for up to 30 doses for sleep.   busPIRone (BUSPAR) 5 MG tablet Take 5 mg by mouth 3 (three) times daily.   clopidogrel (PLAVIX) 75 MG tablet Take 1 tablet (75 mg total) by mouth daily.   doxycycline (VIBRAMYCIN) 100 MG capsule Take 1 capsule (100 mg total) by mouth 2 (two) times daily for 7 days.   fluticasone (FLOVENT HFA) 110 MCG/ACT inhaler Inhale 1 puff into the lungs 2 (two) times daily.   furosemide (LASIX) 20 MG tablet Take 1 tablet (20 mg total) by mouth daily.   glucose blood (ACCU-CHEK AVIVA PLUS) test strip Use to test blood sugar four times daily. Dx: E11.9   insulin aspart (NOVOLOG FLEXPEN) 100 UNIT/ML FlexPen INJECT 12-14 UNITS UNDER THE SKIN AT BREAKFAST, 14-16 UNITS AT LUNCH, AND 12-16 UNITS AT DINNER. (Patient taking differently: Inject 12-16 Units into the skin See admin instructions. 12-14 units at breakfast, 14-16 units at lunch, and 12-16 units at dinner)   insulin degludec (TRESIBA FLEXTOUCH) 200 UNIT/ML FlexTouch Pen Inject 36 Units into the skin at bedtime.   ipratropium-albuterol (DUONEB) 0.5-2.5 (3) MG/3ML SOLN 1 vial in neb every 8 hours and as needed (Patient taking differently: Take 3 mLs by nebulization every 8 (eight) hours as needed (wheezing/shortness of breath).)   irbesartan (AVAPRO) 75 MG tablet Take 75 mg by mouth daily.   levalbuterol (XOPENEX HFA) 45 MCG/ACT inhaler INHALE 1-2 PUFFS EVERY 6 HOURS IF  NEEDED FOR WHEEZING. (Patient taking differently: Inhale 1-2 puffs into the lungs every 6 (six) hours as needed for wheezing.)   metoprolol succinate (TOPROL-XL) 50 MG 24 hr tablet Take 50 mg by mouth 2 (two) times daily.   mirabegron ER (MYRBETRIQ) 50 MG TB24 tablet Take 1 tablet (50 mg total) by mouth daily.   Multiple Vitamin (MULTIVITAMIN WITH MINERALS) TABS tablet Take 1 tablet by mouth daily.   pantoprazole (PROTONIX) 40 MG tablet Take 1 tablet (40 mg total) by mouth daily.   polyethylene glycol powder (GLYCOLAX/MIRALAX) 17 GM/SCOOP powder Take 17 g by mouth daily.   pravastatin (PRAVACHOL) 20 MG tablet Take 1 tablet (20 mg total) by mouth daily.   thiamine 100 MG tablet Take 1 tablet (100 mg total) by mouth daily.   Tiotropium Bromide-Olodaterol (STIOLTO RESPIMAT) 2.5-2.5 MCG/ACT AERS Inhale 2 puffs into the lungs daily.   VENTOLIN   HFA 108 (90 Base) MCG/ACT inhaler Inhale 2 puffs into the lungs every 6 (six) hours as needed for wheezing or shortness of breath.   No facility-administered encounter medications on file as of 10/31/2021.    Review of Systems  Constitutional:  Negative for appetite change, chills, fatigue, fever and unexpected weight change.  HENT:  Negative for congestion, dental problem, ear discharge, ear pain, facial swelling, hearing loss, nosebleeds, postnasal drip, rhinorrhea, sinus pressure, sinus pain, sneezing, sore throat, tinnitus and trouble swallowing.   Eyes:  Negative for pain, discharge, redness, itching and visual disturbance.  Respiratory:  Negative for cough, chest tightness, shortness of breath and wheezing.   Cardiovascular:  Negative for chest pain, palpitations and leg swelling.  Gastrointestinal:  Negative for abdominal distention, abdominal pain, blood in stool, constipation, diarrhea, nausea and vomiting.  Endocrine: Negative for cold intolerance, heat intolerance, polydipsia, polyphagia and polyuria.  Genitourinary:  Negative for difficulty  urinating, dysuria, flank pain and urgency.       Incontinent for bladder  Musculoskeletal:  Positive for arthralgias and gait problem. Negative for back pain, joint swelling, myalgias, neck pain and neck stiffness.  Skin:  Positive for wound. Negative for color change, pallor and rash.       Right foot diabetic ulcer  Neurological:  Negative for dizziness, syncope, speech difficulty, weakness, light-headedness, numbness and headaches.  Hematological:  Does not bruise/bleed easily.  Psychiatric/Behavioral:  Negative for agitation, behavioral problems, confusion, hallucinations, self-injury, sleep disturbance and suicidal ideas. The patient is not nervous/anxious.        Memory lapse    Immunization History  Administered Date(s) Administered   Fluad Quad(high Dose 65+) 01/30/2019, 03/01/2020, 02/08/2021   Influenza Split 01/07/2011, 02/22/2012   Influenza Whole 05/09/2007, 02/05/2010, 01/22/2017   Influenza, High Dose Seasonal PF 02/06/2018   Influenza,inj,Quad PF,6+ Mos 01/21/2013, 03/18/2014, 01/08/2015, 01/13/2016   Janssen (J&J) SARS-COV-2 Vaccination 01/23/2020   Moderna SARS-COV2 Booster Vaccination 05/26/2020, 11/15/2020   Pneumococcal Conjugate-13 11/24/2013   Pneumococcal Polysaccharide-23 04/23/2012   Pertinent  Health Maintenance Due  Topic Date Due   DEXA SCAN  Never done   OPHTHALMOLOGY EXAM  12/28/2018   FOOT EXAM  09/01/2021   INFLUENZA VACCINE  12/06/2021   HEMOGLOBIN A1C  04/05/2022      10/05/2021    8:55 PM 10/06/2021   10:00 AM 10/06/2021    9:15 PM 10/07/2021    9:00 AM 10/28/2021   11:54 AM  Fall Risk  Patient Fall Risk Level Moderate fall risk Moderate fall risk Moderate fall risk High fall risk High fall risk   Functional Status Survey:    Vitals:   10/31/21 1047  BP: 120/68  Pulse: 64  Resp: (!) 24  Temp: (!) 97.3 F (36.3 C)  TempSrc: Temporal  SpO2: 98%  Weight: 128 lb (58.1 kg)  Height: 5' 3" (1.6 m)   Body mass index is 22.67  kg/m. Physical Exam Vitals reviewed.  Constitutional:      General: She is not in acute distress.    Appearance: Normal appearance. She is normal weight. She is not ill-appearing or diaphoretic.     Comments: Right foot drainage into the shoe strong odor noted  HENT:     Head: Normocephalic.     Right Ear: Tympanic membrane, ear canal and external ear normal. There is no impacted cerumen.     Left Ear: Tympanic membrane, ear canal and external ear normal. There is no impacted cerumen.     Nose: Nose normal. No  congestion or rhinorrhea.     Mouth/Throat:     Mouth: Mucous membranes are moist.     Pharynx: Oropharynx is clear. No oropharyngeal exudate or posterior oropharyngeal erythema.  Eyes:     General: No scleral icterus.       Right eye: No discharge.        Left eye: No discharge.     Extraocular Movements: Extraocular movements intact.     Conjunctiva/sclera: Conjunctivae normal.     Pupils: Pupils are equal, round, and reactive to light.  Neck:     Vascular: No carotid bruit.  Cardiovascular:     Rate and Rhythm: Normal rate and regular rhythm.     Pulses: Normal pulses.     Heart sounds: Normal heart sounds. No murmur heard.    No friction rub. No gallop.  Pulmonary:     Effort: Pulmonary effort is normal. No respiratory distress.     Breath sounds: Normal breath sounds. No wheezing, rhonchi or rales.  Chest:     Chest wall: No tenderness.  Abdominal:     General: Bowel sounds are normal. There is no distension.     Palpations: Abdomen is soft. There is no mass.     Tenderness: There is no abdominal tenderness. There is no right CVA tenderness, left CVA tenderness, guarding or rebound.  Musculoskeletal:        General: No swelling or tenderness. Normal range of motion.     Cervical back: Normal range of motion. No rigidity or tenderness.     Right lower leg: No edema.     Left lower leg: No edema.  Feet:     Right foot:     Skin integrity: Ulcer present. No skin  breakdown, erythema, warmth or callus.     Toenail Condition: Right toenails are abnormally thick and long.     Left foot:     Skin integrity: No ulcer, erythema, warmth or callus.     Toenail Condition: Left toenails are abnormally thick and long.  Lymphadenopathy:     Cervical: No cervical adenopathy.  Skin:    General: Skin is warm and dry.     Coloration: Skin is not pale.     Findings: No bruising, erythema, lesion or rash.     Comments: Right medial lateral foot 3 x 2 cm circular ulcer with the yellow serous drainage wound bed yellow tissue noted.  Surrounding skin tissue without any signs of erythema.  Wound cleansed with saline pat dry and triple antibiotic ointment applied and covered with foam dressing for absorption and protection  Neurological:     Mental Status: She is alert.     Cranial Nerves: No cranial nerve deficit.     Sensory: No sensory deficit.     Motor: No weakness.     Coordination: Coordination normal.     Gait: Gait abnormal.     Comments: Memory loss  Psychiatric:        Mood and Affect: Mood normal.        Speech: Speech normal.        Behavior: Behavior normal.        Thought Content: Thought content normal.        Cognition and Memory: Memory is impaired.     Comments: Scored 17 out of 30 on MMSE today     Labs reviewed: Recent Labs    10/03/21 0329 10/03/21 0731 10/04/21 0714 10/28/21 1217 10/28/21 1226  NA 140 141 138 136  133*  K 4.1 3.3* 3.8 4.9 4.7  CL 106 107 105 96*  --   CO2 17* 26 29 26  --   GLUCOSE 253* 156* 185* 455*  --   BUN 28* 23 12 8  --   CREATININE 1.27* 0.92 0.77 0.83  --   CALCIUM 9.2 8.8* 8.6* 9.5  --   MG 1.8  --   --   --   --    Recent Labs    08/09/21 1044 10/02/21 1835 10/28/21 1217  AST 29 12* 14*  ALT 18 12 9  ALKPHOS 50 62 52  BILITOT 0.8 1.8* 0.9  PROT 7.2 8.6* 6.7  ALBUMIN 3.8 4.2 3.4*   Recent Labs    10/02/21 1835 10/02/21 2029 10/03/21 0329 10/04/21 0714 10/28/21 1217 10/28/21 1226   WBC 12.4*  --  11.8* 6.3 7.0  --   NEUTROABS 10.5*  --  8.2*  --  5.0  --   HGB 18.8*   < > 17.0* 14.7 14.9 16.0*  HCT 61.6*   < > 52.5* 46.1* 46.5* 47.0*  MCV 94.5  --  89.3 88.8 91.5  --   PLT 209  --  188 135* 189  --    < > = values in this interval not displayed.   Lab Results  Component Value Date   TSH 1.621 08/10/2021   Lab Results  Component Value Date   HGBA1C >15.5 (H) 10/03/2021   Lab Results  Component Value Date   CHOL 90 08/09/2021   HDL 38 (L) 08/09/2021   LDLCALC 42 08/09/2021   LDLDIRECT 89.0 10/14/2014   TRIG 50 08/09/2021   CHOLHDL 2.4 08/09/2021    Significant Diagnostic Results in last 30 days:  DG Foot Complete Right  Result Date: 10/28/2021 CLINICAL DATA:  ulcer EXAM: RIGHT FOOT COMPLETE - 3+ VIEW COMPARISON:  None Available. FINDINGS: There is no evidence of fracture or dislocation. There is no evidence of arthropathy or other focal bone abnormality. There appears to be a soft tissue defect/ulcer at the medial aspect of the mid to hindfoot. IMPRESSION: There appears to be an ulcer at the medial mid-hindfoot. Osseous structures have a normal appearance. Electronically Signed   By: Amar  Amaresh M.D.   On: 10/28/2021 14:16   DG ESOPHAGUS W SINGLE CM (SOL OR THIN BA)  Result Date: 10/06/2021 CLINICAL DATA:  Dysphagia. Prior history of esophageal stricture with prior dilation procedures. Request for esophagram. EXAM: ESOPHAGUS/BARIUM SWALLOW/TABLET STUDY TECHNIQUE: Single contrast examination was performed using thin liquid barium. This exam was performed by Kevin Bruning PA-C, and was supervised and interpreted by Dr. Walt Liebkemann. FLUOROSCOPY: Radiation Exposure Index (as provided by the fluoroscopic device): 17.10 mGy Kerma COMPARISON:  None Available. FINDINGS: Swallowing: Appears normal in the oblique projection. No vestibular penetration or aspiration seen. Pharynx: Unremarkable. Esophagus: The gastroesophageal junction could only be distended to about  1.0 cm, but appears smoothly marginated. This is shown for example on image 9 series 7. Esophageal motility: No gross dysmotility seen however not fully evaluated as patient could only complete examination in the supine position. Hiatal Hernia: Very small type 1 hiatal hernia Gastroesophageal reflux: None visualized. Ingested 13 mm barium tablet: Transient impaction in the distal esophagus . However after approximately 2 minutes, tablet passed into the stomach with the aid of additional thin barium. Contrast did flow freely around the tablet until passage. IMPRESSION: 1. Smooth narrowing at the gastroesophageal junction, causing transient impaction of the 13 mm barium tablet   which passed after about 2 minutes with additional swallows of thin barium. No substantial irregularity is identified in this vicinity two further suggest malignancy, although endoscopy may be warranted for further investigation and potential therapy. 2. Very small type 1 hiatal hernia. Electronically Signed   By: Walter  Liebkemann M.D.   On: 10/06/2021 09:45   DG Chest Port 1 View  Result Date: 10/02/2021 CLINICAL DATA:  Questionable sepsis.  Evaluate for abnormality. EXAM: PORTABLE CHEST 1 VIEW COMPARISON:  Chest two views 02/08/2021 FINDINGS: Left chest wall cardiac pacer with leads overlying the right atrium and right ventricle. Cardiac silhouette and mediastinal contours are unchanged and within normal limits with moderate calcification again seen within the aortic arch. Surgical clips overlie the midline neck base. The lungs are clear. No pleural effusion or pneumothorax. Minimal levocurvature of the midthoracic spine. IMPRESSION: No acute cardiopulmonary disease process. Electronically Signed   By: Ronald  Viola M.D.   On: 10/02/2021 19:13    Assessment/Plan 1. Uncontrolled type 2 diabetes mellitus with hyperglycemia (HCC) Lab Results  Component Value Date   HGBA1C >15.5 (H) 10/03/2021   - CBC with  Differential/Platelet -Forgets to use insulin times. - - Ambulatory referral to Home Health: Home health nurse for medication management and diabetic education.  2. Primary hypertension Blood pressure well controlled -Continue irbesartan and Metoprolol - CBC with Differential/Platelet - BMP with eGFR([facility])  3. COPD mixed type (HCC) Breathing stable -Continue on a current inhalers - BMP with eGFR([facility])  4. Dementia without behavioral disturbance (HCC) Scored 17 out of 30 on MMSE indicating severe cognitive impairment - Ambulatory referral to Home Health: Home health nurse aide to assist with activities of daily living  5. Unsteady gait -Fall and safety precaution - Ambulatory referral to Home Health: Home health physical therapy for range of motion, exercise gait stability and muscle strengthening   6. Diabetic ulcer of right midfoot associated with type 2 diabetes mellitus, unspecified ulcer stage (HCC) Afebrile Right medial lateral foot 3 x 2 cm circular ulcer with the yellow serous drainage wound bed yellow tissue noted.  Surrounding skin tissue without any signs of erythema.  Wound cleansed with saline pat dry and triple antibiotic ointment applied and covered with foam dressing for absorption and protection  - Ambulatory referral to Home Health: Home health nurse for wound management - Ambulatory referral to Podiatry  7. Overgrown toenails Thick overgrown toenails noted.Will refer to podiatrist to trim toenails - Ambulatory referral to Podiatry  Family/ staff Communication: Reviewed plan of care with patient verbalized understanding  Labs/tests ordered:  - CBC with Differential/Platelet - BMP with eGFR([facility])  Next Appointment: Return in about 2 years (around 11/01/2023), or if symptoms worsen or fail to improve, for left foot diabetic ulcer .    C , NP 

## 2021-10-31 NOTE — Telephone Encounter (Signed)
Both need to be filled.antibioitics ordered from the hospital doxycycline for tick bite and Keflex for UTI

## 2021-11-01 ENCOUNTER — Other Ambulatory Visit: Payer: Self-pay | Admitting: Internal Medicine

## 2021-11-01 ENCOUNTER — Other Ambulatory Visit: Payer: Self-pay | Admitting: *Deleted

## 2021-11-01 DIAGNOSIS — I10 Essential (primary) hypertension: Secondary | ICD-10-CM | POA: Diagnosis not present

## 2021-11-01 DIAGNOSIS — Z9181 History of falling: Secondary | ICD-10-CM | POA: Diagnosis not present

## 2021-11-01 DIAGNOSIS — K7581 Nonalcoholic steatohepatitis (NASH): Secondary | ICD-10-CM | POA: Diagnosis not present

## 2021-11-01 DIAGNOSIS — J310 Chronic rhinitis: Secondary | ICD-10-CM | POA: Diagnosis not present

## 2021-11-01 DIAGNOSIS — H409 Unspecified glaucoma: Secondary | ICD-10-CM | POA: Diagnosis not present

## 2021-11-01 DIAGNOSIS — H8110 Benign paroxysmal vertigo, unspecified ear: Secondary | ICD-10-CM | POA: Diagnosis not present

## 2021-11-01 DIAGNOSIS — K222 Esophageal obstruction: Secondary | ICD-10-CM | POA: Diagnosis not present

## 2021-11-01 DIAGNOSIS — J449 Chronic obstructive pulmonary disease, unspecified: Secondary | ICD-10-CM | POA: Diagnosis not present

## 2021-11-01 DIAGNOSIS — Z4800 Encounter for change or removal of nonsurgical wound dressing: Secondary | ICD-10-CM | POA: Diagnosis not present

## 2021-11-01 DIAGNOSIS — E1165 Type 2 diabetes mellitus with hyperglycemia: Secondary | ICD-10-CM | POA: Diagnosis not present

## 2021-11-01 DIAGNOSIS — E11621 Type 2 diabetes mellitus with foot ulcer: Secondary | ICD-10-CM | POA: Diagnosis not present

## 2021-11-01 DIAGNOSIS — K219 Gastro-esophageal reflux disease without esophagitis: Secondary | ICD-10-CM | POA: Diagnosis not present

## 2021-11-01 DIAGNOSIS — K449 Diaphragmatic hernia without obstruction or gangrene: Secondary | ICD-10-CM | POA: Diagnosis not present

## 2021-11-01 DIAGNOSIS — D696 Thrombocytopenia, unspecified: Secondary | ICD-10-CM | POA: Diagnosis not present

## 2021-11-01 DIAGNOSIS — F0394 Unspecified dementia, unspecified severity, with anxiety: Secondary | ICD-10-CM | POA: Diagnosis not present

## 2021-11-01 DIAGNOSIS — E1142 Type 2 diabetes mellitus with diabetic polyneuropathy: Secondary | ICD-10-CM | POA: Diagnosis not present

## 2021-11-01 DIAGNOSIS — R32 Unspecified urinary incontinence: Secondary | ICD-10-CM | POA: Diagnosis not present

## 2021-11-01 DIAGNOSIS — K589 Irritable bowel syndrome without diarrhea: Secondary | ICD-10-CM | POA: Diagnosis not present

## 2021-11-01 DIAGNOSIS — K5790 Diverticulosis of intestine, part unspecified, without perforation or abscess without bleeding: Secondary | ICD-10-CM | POA: Diagnosis not present

## 2021-11-01 DIAGNOSIS — M48061 Spinal stenosis, lumbar region without neurogenic claudication: Secondary | ICD-10-CM | POA: Diagnosis not present

## 2021-11-01 DIAGNOSIS — M1990 Unspecified osteoarthritis, unspecified site: Secondary | ICD-10-CM | POA: Diagnosis not present

## 2021-11-01 DIAGNOSIS — R41841 Cognitive communication deficit: Secondary | ICD-10-CM | POA: Diagnosis not present

## 2021-11-01 DIAGNOSIS — E785 Hyperlipidemia, unspecified: Secondary | ICD-10-CM | POA: Diagnosis not present

## 2021-11-01 DIAGNOSIS — L97419 Non-pressure chronic ulcer of right heel and midfoot with unspecified severity: Secondary | ICD-10-CM | POA: Diagnosis not present

## 2021-11-01 DIAGNOSIS — R350 Frequency of micturition: Secondary | ICD-10-CM | POA: Diagnosis not present

## 2021-11-01 NOTE — Patient Outreach (Signed)
[facility]) Care Management Telephonic RN Care Manager Note   11/01/2021 Name:  [name] MRN:  [id number] DOB:  1936/03/19  Summary: Successful outreach to patient Home health Nurse visited today per pt She reports she refused [facility] PT related pain of back She voices her preference in not doing exercises to prevent back/hip pain  Falls none this week recent fall in laundry room called EMS, has alert necklace but don't think about using it She can not remember to pull it  She reports she is aware she is having memory changes She reports she was informed on 10/31/21 to keep her feet clean  She reports she has not heard from [employer] [facility] ([facility] Personal care services (PCS) aide yet She informs RN CM she is not pleased with any of the [facility] staff   Uses cab for transportation [employer] friend) Again does not seem encouraged to use medicaid nor [employer] transportation services when discussed   Diabetes "high" but she reports she can not recall changing her diet She reports eating high proteins She is not able to identify why her values are elevated in the 400-500s recently She was not aware of her recent HgA1c of >15.5  She reports her diabetes was not discussed at her pcp office recently  She confirms she has not been seen by an endocrinologist since [name] Has not taken cbg today, had not taken cbg during last outreach When taken she is 89 and ate jimmy dean biscuit with sausage chicken noodle soup skim milk diet coke She has not had her insulin She states she takes her [place] in the morning vs at night as listed in [place]. She then informed RN CM she was not sure if her insulin had expired (was opened for more than 28 days)  She discussed having her unopened insulin in her hand and the dosage She reports she would administer the scheduled dose   Recommendations/Changes made from today's visit: Successful follow up with patient Encouraged her to  wear alert and pull it or use a device that if she fall goes off automatically  Reminded pt again of her and RN CM discussing medicaid transportation in 2022 and discussed [employer] for transportation Discussed the 10/03/21 HgA1c of >15.5 Importance of managing DM, risks, monitoring, diet, taking medications as ordered RN CM requested pt to check her cbg while on the line with RN CM  Encouraged her to open her un open insulin and take her scheduled dose as ordered and then to re check her cbg. Discussed cbg values to seek EMS services for. DM action plan reviewed  Encouraged to outreach to RN CM prn   Subjective: [name] is an 86 y.o. year old female who is a primary patient of Wardell Honour, MD. The care management team was consulted for assistance with care management and/or care coordination needs.    Telephonic RN Care Manager completed Telephone Visit today.   Objective:  Medications Reviewed Today     Reviewed by Arman Bogus (Pharmacy Technician) on 10/28/21 at 1509  Med List Status: F/U - See Status Comment   Medication Order Taking? Sig Documenting Provider Last Dose Status Informant  acetaminophen (TYLENOL) 325 MG tablet [id number]  Take 2 tablets (650 mg total) by mouth every 6 (six) hours as needed for mild pain (or Fever >/= 101). Domenic Polite, MD  Active Other  ALPRAZolam Duanne Moron) 0.5 MG tablet [id number]  Take 0.5 tablets (0.25 mg  total) by mouth at bedtime as needed for up to 30 doses for sleep. Shelly Coss, MD  Active            Med Note Truman Hayward, Evelina Bucy   Fri Oct 28, 2021  3:00 PM) Last fill 10-07-2021, 30 day supply  busPIRone (BUSPAR) 5 MG tablet [id number]  Take 5 mg by mouth 3 (three) times daily. [provider]  Active            Med Note Truman Hayward, Evelina Bucy   Fri Oct 28, 2021  2:55 PM) Last fill 10-15-2021, 21 day supply  clopidogrel (PLAVIX) 75 MG tablet [id number]  Take 1 tablet (75 mg total) by mouth daily. Nehemie Friar, PA-C  Active            Med Note Truman Hayward, Evelina Bucy   Fri Oct 28, 2021  3:01 PM) Last fill 10-07-2021, 30 day supply  fluticasone (FLOVENT HFA) 110 MCG/ACT inhaler [id number]  Inhale 1 puff into the lungs 2 (two) times daily. [provider]  Active            Med Note Truman Hayward, Evelina Bucy   Fri Oct 28, 2021  2:57 PM) Last fill 10-15-2021, 60 day supply  furosemide (LASIX) 20 MG tablet [id number]  Take 1 tablet (20 mg total) by mouth daily. Shelly Coss, MD  Active            Med Note Truman Hayward, Evelina Bucy   Fri Oct 28, 2021  3:01 PM) Last fill 10-07-2021, 30 day supply  glucose blood (ACCU-CHEK AVIVA PLUS) test strip [id number]  Use to test blood sugar four times daily. Dx: E11.9 Wardell Honour, MD  Active Other  insulin aspart (NOVOLOG FLEXPEN) 100 UNIT/ML FlexPen [id number]  INJECT 12-14 UNITS UNDER THE SKIN AT BREAKFAST, 14-16 UNITS AT LUNCH, AND 12-16 UNITS AT DINNER.  Patient taking differently: Inject 12-16 Units into the skin See admin instructions. 12-14 units at breakfast, 14-16 units at lunch, and 12-16 units at dinner   Lauree Chandler, NP  Active Other           Med Note Truman Hayward, Evelina Bucy   Fri Oct 28, 2021  3:01 PM) Last fill 10-07-2021, 24 day supply  insulin degludec (TRESIBA FLEXTOUCH) 200 UNIT/ML FlexTouch Pen [id number]  Inject 36 Units into the skin at bedtime. Lauree Chandler, NP  Active Other           Med Note Truman Hayward, Evelina Bucy   Fri Oct 28, 2021  2:58 PM) Last fill 10-15-2021, 16 day supply  ipratropium-albuterol (DUONEB) 0.5-2.5 (3) MG/3ML SOLN [id number]  1 vial in neb every 8 hours and as needed  Patient taking differently: Take 3 mLs by nebulization every 8 (eight) hours as needed (wheezing/shortness of breath).   Deneise Lever, MD  Active Other           Med Note Truman Hayward, Evelina Bucy   Fri Oct 28, 2021  3:02 PM) Last fill 10-07-2021, 10 day supply  irbesartan (AVAPRO) 75 MG tablet [id number]  Take 75 mg by mouth daily. [provider]  Active Other            Med Note Truman Hayward, Evelina Bucy   Fri Oct 28, 2021  3:03 PM) Last fill 10-07-2021, 30 day supply  levalbuterol (XOPENEX HFA) 45 MCG/ACT inhaler [id number]  INHALE 1-2 PUFFS EVERY 6 HOURS IF NEEDED FOR WHEEZING.  Patient taking differently: Inhale 1-2 puffs into the lungs every 6 (six)  hours as needed for wheezing.   Deneise Lever, MD  Active Other           Med Note Truman Hayward, Evelina Bucy   Fri Oct 28, 2021  3:07 PM) Last fill 08-24-2021, 15 day supply  metoprolol succinate (TOPROL-XL) 50 MG 24 hr tablet [id number]  Take 50 mg by mouth 2 (two) times daily. [provider]  Active Other           Med Note Truman Hayward, Evelina Bucy   Fri Oct 28, 2021  2:59 PM) Last fill 10-15-2021, 30 day supply  mirabegron ER (MYRBETRIQ) 50 MG TB24 tablet [id number]  Take 1 tablet (50 mg total) by mouth daily. Wardell Honour, MD  Active            Med Note Truman Hayward, Evelina Bucy   Fri Oct 28, 2021  3:03 PM) Last fill 10-07-2021, 30 day supply  Multiple Vitamin (MULTIVITAMIN WITH MINERALS) TABS tablet [id number]  Take 1 tablet by mouth daily. Pokhrel, Laxman, MD  Active   pantoprazole (PROTONIX) 40 MG tablet [id number]  Take 1 tablet (40 mg total) by mouth daily. Flora Lipps, MD  Active            Med Note Truman Hayward, Evelina Bucy   Fri Oct 28, 2021  3:03 PM) Last fill 10-07-2021, 30 day supply  polyethylene glycol powder (GLYCOLAX/MIRALAX) 17 GM/SCOOP powder [id number]  Take 17 g by mouth daily. [provider]  Active Other  pravastatin (PRAVACHOL) 20 MG tablet [id number]  Take 1 tablet (20 mg total) by mouth daily. Wardell Honour, MD  Active Other           Med Note Truman Hayward, Evelina Bucy   Fri Oct 28, 2021  3:04 PM) Last fill 10-07-2021, 30 day supply  thiamine 100 MG tablet [id number]  Take 1 tablet (100 mg total) by mouth daily. Pokhrel, Laxman, MD  Active   Tiotropium Bromide-Olodaterol (STIOLTO RESPIMAT) 2.5-2.5 MCG/ACT AERS [id number]  Inhale 2 puffs into the lungs daily. Deneise Lever, MD  Active            Med Note Truman Hayward,  Evelina Bucy   Fri Oct 28, 2021  3:04 PM) Last fill 10-14-2021, 30 day supply  VENTOLIN HFA 108 (90 Base) MCG/ACT inhaler [id number]  Inhale 2 puffs into the lungs every 6 (six) hours as needed for wheezing or shortness of breath. [provider]  Active            Med Note Truman Hayward, Evelina Bucy   Fri Oct 28, 2021  3:00 PM) Last fill 10-07-2021, 25 day supply  Med List Note Burnett Harry, CPhT 10/04/21 9371): [facility] does not manage meds             SDOH:  (Social Determinants of Health) assessments and interventions performed:    Care Plan  Review of patient past medical history, allergies, medications, health status, including review of consultants reports, laboratory and other test data, was performed as part of comprehensive evaluation for care management services.   There are no care plans that you recently modified to display for this patient.    Plan: The patient has been provided with contact information for the care management team and has been advised to call with any health related questions or concerns.  The care management team will reach out to the patient again over the next 30+ business days.  Kaelin Holford L. Lavina Hamman, RN, BSN, [place] Telephonic Care Management Care Coordinator  Office number [phone] Main [facility] number [phone] Fax number [phone]

## 2021-11-02 ENCOUNTER — Other Ambulatory Visit: Payer: Self-pay | Admitting: Family

## 2021-11-04 DIAGNOSIS — I10 Essential (primary) hypertension: Secondary | ICD-10-CM | POA: Diagnosis not present

## 2021-11-04 DIAGNOSIS — E1142 Type 2 diabetes mellitus with diabetic polyneuropathy: Secondary | ICD-10-CM | POA: Diagnosis not present

## 2021-11-04 DIAGNOSIS — L97419 Non-pressure chronic ulcer of right heel and midfoot with unspecified severity: Secondary | ICD-10-CM | POA: Diagnosis not present

## 2021-11-04 DIAGNOSIS — J449 Chronic obstructive pulmonary disease, unspecified: Secondary | ICD-10-CM | POA: Diagnosis not present

## 2021-11-04 DIAGNOSIS — E1165 Type 2 diabetes mellitus with hyperglycemia: Secondary | ICD-10-CM | POA: Diagnosis not present

## 2021-11-04 DIAGNOSIS — E11621 Type 2 diabetes mellitus with foot ulcer: Secondary | ICD-10-CM | POA: Diagnosis not present

## 2021-11-09 ENCOUNTER — Inpatient Hospital Stay (HOSPITAL_COMMUNITY)
Admission: EM | Admit: 2021-11-09 | Discharge: 2021-11-13 | DRG: 638 | Disposition: A | Discharge status: Home Health | Payer: Medicare Other | Attending: Internal Medicine | Admitting: Internal Medicine

## 2021-11-09 ENCOUNTER — Emergency Department (HOSPITAL_COMMUNITY): Payer: Medicare Other

## 2021-11-09 ENCOUNTER — Observation Stay (HOSPITAL_COMMUNITY): Payer: Medicare Other

## 2021-11-09 ENCOUNTER — Encounter (HOSPITAL_COMMUNITY): Payer: Self-pay

## 2021-11-09 ENCOUNTER — Other Ambulatory Visit: Payer: Self-pay

## 2021-11-09 DIAGNOSIS — R0902 Hypoxemia: Secondary | ICD-10-CM | POA: Diagnosis not present

## 2021-11-09 DIAGNOSIS — N3001 Acute cystitis with hematuria: Secondary | ICD-10-CM | POA: Diagnosis not present

## 2021-11-09 DIAGNOSIS — Z950 Presence of cardiac pacemaker: Secondary | ICD-10-CM | POA: Diagnosis not present

## 2021-11-09 DIAGNOSIS — Z6822 Body mass index (BMI) 22.0-22.9, adult: Secondary | ICD-10-CM

## 2021-11-09 DIAGNOSIS — I10 Essential (primary) hypertension: Secondary | ICD-10-CM | POA: Diagnosis present

## 2021-11-09 DIAGNOSIS — W19XXXA Unspecified fall, initial encounter: Secondary | ICD-10-CM | POA: Diagnosis not present

## 2021-11-09 DIAGNOSIS — Z7902 Long term (current) use of antithrombotics/antiplatelets: Secondary | ICD-10-CM

## 2021-11-09 DIAGNOSIS — Z9981 Dependence on supplemental oxygen: Secondary | ICD-10-CM

## 2021-11-09 DIAGNOSIS — E785 Hyperlipidemia, unspecified: Secondary | ICD-10-CM | POA: Diagnosis present

## 2021-11-09 DIAGNOSIS — R1900 Intra-abdominal and pelvic swelling, mass and lump, unspecified site: Secondary | ICD-10-CM | POA: Diagnosis not present

## 2021-11-09 DIAGNOSIS — Z881 Allergy status to other antibiotic agents status: Secondary | ICD-10-CM

## 2021-11-09 DIAGNOSIS — J449 Chronic obstructive pulmonary disease, unspecified: Secondary | ICD-10-CM | POA: Diagnosis present

## 2021-11-09 DIAGNOSIS — Z91048 Other nonmedicinal substance allergy status: Secondary | ICD-10-CM

## 2021-11-09 DIAGNOSIS — N390 Urinary tract infection, site not specified: Secondary | ICD-10-CM | POA: Diagnosis present

## 2021-11-09 DIAGNOSIS — M25512 Pain in left shoulder: Secondary | ICD-10-CM | POA: Diagnosis present

## 2021-11-09 DIAGNOSIS — Z66 Do not resuscitate: Secondary | ICD-10-CM | POA: Diagnosis present

## 2021-11-09 DIAGNOSIS — Z20822 Contact with and (suspected) exposure to covid-19: Secondary | ICD-10-CM | POA: Diagnosis present

## 2021-11-09 DIAGNOSIS — E875 Hyperkalemia: Secondary | ICD-10-CM | POA: Diagnosis not present

## 2021-11-09 DIAGNOSIS — I442 Atrioventricular block, complete: Secondary | ICD-10-CM | POA: Diagnosis present

## 2021-11-09 DIAGNOSIS — K760 Fatty (change of) liver, not elsewhere classified: Secondary | ICD-10-CM | POA: Diagnosis present

## 2021-11-09 DIAGNOSIS — Y92009 Unspecified place in unspecified non-institutional (private) residence as the place of occurrence of the external cause: Secondary | ICD-10-CM

## 2021-11-09 DIAGNOSIS — E11621 Type 2 diabetes mellitus with foot ulcer: Secondary | ICD-10-CM | POA: Diagnosis present

## 2021-11-09 DIAGNOSIS — E1110 Type 2 diabetes mellitus with ketoacidosis without coma: Principal | ICD-10-CM | POA: Diagnosis present

## 2021-11-09 DIAGNOSIS — K219 Gastro-esophageal reflux disease without esophagitis: Secondary | ICD-10-CM | POA: Diagnosis present

## 2021-11-09 DIAGNOSIS — S0083XA Contusion of other part of head, initial encounter: Secondary | ICD-10-CM | POA: Diagnosis not present

## 2021-11-09 DIAGNOSIS — R54 Age-related physical debility: Secondary | ICD-10-CM | POA: Diagnosis present

## 2021-11-09 DIAGNOSIS — Z8673 Personal history of transient ischemic attack (TIA), and cerebral infarction without residual deficits: Secondary | ICD-10-CM

## 2021-11-09 DIAGNOSIS — R4020 Unspecified coma: Secondary | ICD-10-CM | POA: Diagnosis not present

## 2021-11-09 DIAGNOSIS — R739 Hyperglycemia, unspecified: Secondary | ICD-10-CM | POA: Diagnosis not present

## 2021-11-09 DIAGNOSIS — G8929 Other chronic pain: Secondary | ICD-10-CM | POA: Diagnosis present

## 2021-11-09 DIAGNOSIS — Z885 Allergy status to narcotic agent status: Secondary | ICD-10-CM

## 2021-11-09 DIAGNOSIS — R0689 Other abnormalities of breathing: Secondary | ICD-10-CM | POA: Diagnosis not present

## 2021-11-09 DIAGNOSIS — L89152 Pressure ulcer of sacral region, stage 2: Secondary | ICD-10-CM | POA: Diagnosis present

## 2021-11-09 DIAGNOSIS — N179 Acute kidney failure, unspecified: Secondary | ICD-10-CM | POA: Diagnosis present

## 2021-11-09 DIAGNOSIS — K7689 Other specified diseases of liver: Secondary | ICD-10-CM | POA: Diagnosis not present

## 2021-11-09 DIAGNOSIS — W19XXXS Unspecified fall, sequela: Secondary | ICD-10-CM | POA: Diagnosis not present

## 2021-11-09 DIAGNOSIS — F419 Anxiety disorder, unspecified: Secondary | ICD-10-CM | POA: Diagnosis present

## 2021-11-09 DIAGNOSIS — K3189 Other diseases of stomach and duodenum: Secondary | ICD-10-CM | POA: Diagnosis not present

## 2021-11-09 DIAGNOSIS — Z7982 Long term (current) use of aspirin: Secondary | ICD-10-CM

## 2021-11-09 DIAGNOSIS — R000 Tachycardia, unspecified: Secondary | ICD-10-CM | POA: Diagnosis not present

## 2021-11-09 DIAGNOSIS — L97519 Non-pressure chronic ulcer of other part of right foot with unspecified severity: Secondary | ICD-10-CM | POA: Diagnosis present

## 2021-11-09 DIAGNOSIS — R079 Chest pain, unspecified: Secondary | ICD-10-CM | POA: Diagnosis not present

## 2021-11-09 DIAGNOSIS — Z794 Long term (current) use of insulin: Secondary | ICD-10-CM | POA: Diagnosis not present

## 2021-11-09 DIAGNOSIS — M5136 Other intervertebral disc degeneration, lumbar region: Secondary | ICD-10-CM | POA: Diagnosis not present

## 2021-11-09 DIAGNOSIS — E1142 Type 2 diabetes mellitus with diabetic polyneuropathy: Secondary | ICD-10-CM | POA: Diagnosis present

## 2021-11-09 DIAGNOSIS — W010XXA Fall on same level from slipping, tripping and stumbling without subsequent striking against object, initial encounter: Secondary | ICD-10-CM | POA: Diagnosis present

## 2021-11-09 DIAGNOSIS — N3289 Other specified disorders of bladder: Secondary | ICD-10-CM | POA: Diagnosis not present

## 2021-11-09 DIAGNOSIS — Z515 Encounter for palliative care: Secondary | ICD-10-CM | POA: Diagnosis not present

## 2021-11-09 DIAGNOSIS — E1165 Type 2 diabetes mellitus with hyperglycemia: Secondary | ICD-10-CM | POA: Diagnosis not present

## 2021-11-09 DIAGNOSIS — S1093XA Contusion of unspecified part of neck, initial encounter: Secondary | ICD-10-CM | POA: Diagnosis not present

## 2021-11-09 DIAGNOSIS — R1310 Dysphagia, unspecified: Secondary | ICD-10-CM | POA: Diagnosis present

## 2021-11-09 DIAGNOSIS — Z043 Encounter for examination and observation following other accident: Secondary | ICD-10-CM | POA: Diagnosis not present

## 2021-11-09 DIAGNOSIS — Z7189 Other specified counseling: Secondary | ICD-10-CM | POA: Diagnosis not present

## 2021-11-09 DIAGNOSIS — Z79899 Other long term (current) drug therapy: Secondary | ICD-10-CM

## 2021-11-09 DIAGNOSIS — R32 Unspecified urinary incontinence: Secondary | ICD-10-CM | POA: Diagnosis present

## 2021-11-09 LAB — I-STAT CHEM 8, ED
BUN: 23 mg/dL (ref 8–23)
Calcium, Ion: 0.89 mmol/L — CL (ref 1.15–1.40)
Chloride: 101 mmol/L (ref 98–111)
Creatinine, Ser: 0.6 mg/dL (ref 0.44–1.00)
Glucose, Bld: 436 mg/dL — ABNORMAL HIGH (ref 70–99)
HCT: 53 % — ABNORMAL HIGH (ref 36.0–46.0)
Hemoglobin: 18 g/dL — ABNORMAL HIGH (ref 12.0–15.0)
Potassium: 4.3 mmol/L (ref 3.5–5.1)
Sodium: 134 mmol/L — ABNORMAL LOW (ref 135–145)
TCO2: 15 mmol/L — ABNORMAL LOW (ref 22–32)

## 2021-11-09 LAB — URINALYSIS, ROUTINE W REFLEX MICROSCOPIC
Bilirubin Urine: NEGATIVE
Glucose, UA: >=500 mg/dL — AB
Ketones, ur: 80 mg/dL — AB
Nitrite: NEGATIVE
Protein, ur: 100 mg/dL — AB
RBC / HPF: >50 RBC/hpf — ABNORMAL HIGH (ref 0–5)
Specific Gravity, Urine: 1.017 (ref 1.005–1.030)
Squamous Epithelial / HPF: >50 — ABNORMAL HIGH (ref 0–5)
WBC, UA: >50 WBC/hpf — ABNORMAL HIGH (ref 0–5)
pH: 5 (ref 5.0–8.0)

## 2021-11-09 LAB — COMPREHENSIVE METABOLIC PANEL
ALT: 11 U/L (ref 0–44)
AST: 17 U/L (ref 15–41)
Albumin: 3.3 g/dL — ABNORMAL LOW (ref 3.5–5.0)
Alkaline Phosphatase: 63 U/L (ref 38–126)
Anion gap: 33 — ABNORMAL HIGH (ref 5–15)
BUN: 19 mg/dL (ref 8–23)
CO2: 12 mmol/L — ABNORMAL LOW (ref 22–32)
Calcium: 9.8 mg/dL (ref 8.9–10.3)
Chloride: 96 mmol/L — ABNORMAL LOW (ref 98–111)
Creatinine, Ser: 1.21 mg/dL — ABNORMAL HIGH (ref 0.44–1.00)
GFR, Estimated: 44 mL/min — ABNORMAL LOW (ref >60)
Glucose, Bld: 415 mg/dL — ABNORMAL HIGH (ref 70–99)
Potassium: 5.6 mmol/L — ABNORMAL HIGH (ref 3.5–5.1)
Sodium: 141 mmol/L (ref 135–145)
Total Bilirubin: 1.8 mg/dL — ABNORMAL HIGH (ref 0.3–1.2)
Total Protein: 6.6 g/dL (ref 6.5–8.1)

## 2021-11-09 LAB — I-STAT VENOUS BLOOD GAS, ED
Acid-base deficit: 10 mmol/L — ABNORMAL HIGH (ref 0.0–2.0)
Bicarbonate: 17.4 mmol/L — ABNORMAL LOW (ref 20.0–28.0)
Calcium, Ion: 0.86 mmol/L — CL (ref 1.15–1.40)
HCT: 56 % — ABNORMAL HIGH (ref 36.0–46.0)
Hemoglobin: 19 g/dL — ABNORMAL HIGH (ref 12.0–15.0)
O2 Saturation: 71 %
Potassium: >8.5 mmol/L (ref 3.5–5.1)
Sodium: 122 mmol/L — ABNORMAL LOW (ref 135–145)
TCO2: 19 mmol/L — ABNORMAL LOW (ref 22–32)
pCO2, Ven: 44.2 mmHg (ref 44–60)
pH, Ven: 7.204 — ABNORMAL LOW (ref 7.25–7.43)
pO2, Ven: 45 mmHg (ref 32–45)

## 2021-11-09 LAB — CBC WITH DIFFERENTIAL/PLATELET
Abs Immature Granulocytes: 0.15 10*3/uL — ABNORMAL HIGH (ref 0.00–0.07)
Basophils Absolute: 0.1 10*3/uL (ref 0.0–0.1)
Basophils Relative: 1 %
Eosinophils Absolute: 0 10*3/uL (ref 0.0–0.5)
Eosinophils Relative: 0 %
HCT: 55.5 % — ABNORMAL HIGH (ref 36.0–46.0)
Hemoglobin: 17.3 g/dL — ABNORMAL HIGH (ref 12.0–15.0)
Immature Granulocytes: 1 %
Lymphocytes Relative: 6 %
Lymphs Abs: 1 10*3/uL (ref 0.7–4.0)
MCH: 28.9 pg (ref 26.0–34.0)
MCHC: 31.2 g/dL (ref 30.0–36.0)
MCV: 92.7 fL (ref 80.0–100.0)
Monocytes Absolute: 0.8 10*3/uL (ref 0.1–1.0)
Monocytes Relative: 4 %
Neutro Abs: 16.3 10*3/uL — ABNORMAL HIGH (ref 1.7–7.7)
Neutrophils Relative %: 88 %
Platelets: 336 10*3/uL (ref 150–400)
RBC: 5.99 MIL/uL — ABNORMAL HIGH (ref 3.87–5.11)
RDW: 14.8 % (ref 11.5–15.5)
WBC: 18.3 10*3/uL — ABNORMAL HIGH (ref 4.0–10.5)
nRBC: 0 % (ref 0.0–0.2)

## 2021-11-09 LAB — CBG MONITORING, ED
Glucose-Capillary: 147 mg/dL — ABNORMAL HIGH (ref 70–99)
Glucose-Capillary: 174 mg/dL — ABNORMAL HIGH (ref 70–99)
Glucose-Capillary: 201 mg/dL — ABNORMAL HIGH (ref 70–99)
Glucose-Capillary: 235 mg/dL — ABNORMAL HIGH (ref 70–99)
Glucose-Capillary: 299 mg/dL — ABNORMAL HIGH (ref 70–99)
Glucose-Capillary: 344 mg/dL — ABNORMAL HIGH (ref 70–99)
Glucose-Capillary: 485 mg/dL — ABNORMAL HIGH (ref 70–99)

## 2021-11-09 LAB — RESP PANEL BY RT-PCR (FLU A&B, COVID) ARPGX2
Influenza A by PCR: NEGATIVE
Influenza B by PCR: NEGATIVE
SARS Coronavirus 2 by RT PCR: NEGATIVE

## 2021-11-09 LAB — BETA-HYDROXYBUTYRIC ACID: Beta-Hydroxybutyric Acid: >8 mmol/L — ABNORMAL HIGH (ref 0.05–0.27)

## 2021-11-09 LAB — LACTIC ACID, PLASMA
Lactic Acid, Venous: 3.5 mmol/L (ref 0.5–1.9)
Lactic Acid, Venous: 2.8 mmol/L (ref 0.5–1.9)

## 2021-11-09 LAB — OSMOLALITY: Osmolality: 331 mOsm/kg (ref 275–295)

## 2021-11-09 LAB — TROPONIN I (HIGH SENSITIVITY): Troponin I (High Sensitivity): 27 ng/L — ABNORMAL HIGH (ref <18)

## 2021-11-09 LAB — CK: Total CK: 187 U/L (ref 38–234)

## 2021-11-09 MED ORDER — POTASSIUM CHLORIDE 10 MEQ/100ML IV SOLN
10.0000 meq | Freq: Once | INTRAVENOUS | Status: AC
Start: 2021-11-09 — End: 2021-11-09
  Administered 2021-11-09: 10 meq via INTRAVENOUS
  Filled 2021-11-09: qty 100

## 2021-11-09 MED ORDER — ASPIRIN 81 MG PO TBEC
81.0000 mg | DELAYED_RELEASE_TABLET | Freq: Every day | ORAL | Status: DC
  Administered 2021-11-10 – 2021-11-13 (×4): 81 mg via ORAL
  Filled 2021-11-09 (×4): qty 1

## 2021-11-09 MED ORDER — PANTOPRAZOLE SODIUM 40 MG PO TBEC
40.0000 mg | DELAYED_RELEASE_TABLET | Freq: Every day | ORAL | Status: DC
  Administered 2021-11-10 – 2021-11-13 (×4): 40 mg via ORAL
  Filled 2021-11-09 (×4): qty 1

## 2021-11-09 MED ORDER — CLOPIDOGREL BISULFATE 75 MG PO TABS
75.0000 mg | ORAL_TABLET | Freq: Every day | ORAL | Status: DC
  Administered 2021-11-10 – 2021-11-13 (×4): 75 mg via ORAL
  Filled 2021-11-09 (×4): qty 1

## 2021-11-09 MED ORDER — BUSPIRONE HCL 5 MG PO TABS
5.0000 mg | ORAL_TABLET | Freq: Three times a day (TID) | ORAL | Status: DC
  Administered 2021-11-10 – 2021-11-13 (×10): 5 mg via ORAL
  Filled 2021-11-09 (×10): qty 1

## 2021-11-09 MED ORDER — PRAVASTATIN SODIUM 10 MG PO TABS
20.0000 mg | ORAL_TABLET | Freq: Every day | ORAL | Status: DC
  Administered 2021-11-10 – 2021-11-13 (×4): 20 mg via ORAL
  Filled 2021-11-09 (×5): qty 2

## 2021-11-09 MED ORDER — DEXTROSE IN LACTATED RINGERS 5 % IV SOLN: INTRAVENOUS | Status: DC

## 2021-11-09 MED ORDER — CALCIUM GLUCONATE 10 % IV SOLN
1.0000 g | Freq: Once | INTRAVENOUS | Status: AC
  Administered 2021-11-09: 1 g via INTRAVENOUS
  Filled 2021-11-09: qty 10

## 2021-11-09 MED ORDER — LACTATED RINGERS IV SOLN: INTRAVENOUS | Status: DC

## 2021-11-09 MED ORDER — INSULIN REGULAR(HUMAN) IN NACL 100-0.9 UT/100ML-% IV SOLN
INTRAVENOUS | Status: DC
  Administered 2021-11-09: 8.5 [IU]/h via INTRAVENOUS
  Filled 2021-11-09: qty 100

## 2021-11-09 MED ORDER — DEXTROSE 50 % IV SOLN: 0.0000 mL | INTRAVENOUS | Status: DC | PRN

## 2021-11-09 MED ORDER — ARFORMOTEROL TARTRATE 15 MCG/2ML IN NEBU
15.0000 ug | INHALATION_SOLUTION | Freq: Two times a day (BID) | RESPIRATORY_TRACT | Status: DC
  Administered 2021-11-10 – 2021-11-12 (×4): 15 ug via RESPIRATORY_TRACT
  Filled 2021-11-09 (×4): qty 2

## 2021-11-09 MED ORDER — LACTATED RINGERS IV BOLUS (SEPSIS)
1000.0000 mL | Freq: Once | INTRAVENOUS | Status: AC
  Administered 2021-11-09: 1000 mL via INTRAVENOUS

## 2021-11-09 MED ORDER — METOPROLOL SUCCINATE ER 25 MG PO TB24
50.0000 mg | ORAL_TABLET | Freq: Every day | ORAL | Status: DC
Start: 2021-11-10 — End: 2021-11-11
  Administered 2021-11-10: 50 mg via ORAL
  Filled 2021-11-09: qty 2

## 2021-11-09 MED ORDER — ENOXAPARIN SODIUM 40 MG/0.4ML IJ SOSY
40.0000 mg | PREFILLED_SYRINGE | INTRAMUSCULAR | Status: DC
  Administered 2021-11-09 – 2021-11-12 (×4): 40 mg via SUBCUTANEOUS
  Filled 2021-11-09 (×4): qty 0.4

## 2021-11-09 MED ORDER — ACETAMINOPHEN 325 MG PO TABS
650.0000 mg | ORAL_TABLET | Freq: Four times a day (QID) | ORAL | Status: DC | PRN
Start: 2021-11-09 — End: 2021-11-13
  Administered 2021-11-11 – 2021-11-12 (×2): 650 mg via ORAL
  Filled 2021-11-09 (×2): qty 2

## 2021-11-09 MED ORDER — ALBUTEROL SULFATE (2.5 MG/3ML) 0.083% IN NEBU
2.5000 mg | INHALATION_SOLUTION | Freq: Four times a day (QID) | RESPIRATORY_TRACT | Status: DC | PRN
Start: 2021-11-09 — End: 2021-11-13

## 2021-11-09 MED ORDER — POTASSIUM CHLORIDE 10 MEQ/100ML IV SOLN
10.0000 meq | INTRAVENOUS | Status: DC
  Administered 2021-11-09: 10 meq via INTRAVENOUS
  Filled 2021-11-09: qty 100

## 2021-11-09 MED ORDER — SODIUM CHLORIDE 0.9 % IV SOLN
2.0000 g | INTRAVENOUS | Status: DC
  Administered 2021-11-09 – 2021-11-10 (×2): 2 g via INTRAVENOUS
  Filled 2021-11-09 (×2): qty 20

## 2021-11-09 MED ORDER — UMECLIDINIUM BROMIDE 62.5 MCG/ACT IN AEPB
1.0000 | INHALATION_SPRAY | Freq: Every day | RESPIRATORY_TRACT | Status: DC
  Administered 2021-11-12 – 2021-11-13 (×2): 1 via RESPIRATORY_TRACT
  Filled 2021-11-09: qty 7

## 2021-11-09 MED ORDER — SODIUM CHLORIDE 0.9 % IV BOLUS
1000.0000 mL | Freq: Once | INTRAVENOUS | Status: AC
  Administered 2021-11-09: 1000 mL via INTRAVENOUS

## 2021-11-09 MED ORDER — INSULIN ASPART 100 UNIT/ML IV SOLN
10.0000 [IU] | Freq: Once | INTRAVENOUS | Status: AC
  Administered 2021-11-09: 10 [IU] via INTRAVENOUS

## 2021-11-09 MED ORDER — SODIUM BICARBONATE 8.4 % IV SOLN
50.0000 meq | Freq: Once | INTRAVENOUS | Status: AC
  Administered 2021-11-09: 50 meq via INTRAVENOUS
  Filled 2021-11-09: qty 50

## 2021-11-09 NOTE — ED Provider Notes (Signed)
[facility] EMERGENCY DEPARTMENT Provider Note   CSN: [id number] Arrival date & time: 11/09/21  1311     History  Chief Complaint  Patient presents with   [name] is a 86 y.o. female with a past medical history of type 2 diabetes mellitus with diabetic polyneuropathy, essential hypertension, presence of permanent cardiac pacemaker, acute ischemic stroke (08/10/2021), COPD (on 2 LPM of O2 via nasal cannula continuously).    Patient presents the emergency department with a complaint of fall with prolonged time on the floor.  Patient states that she was walking in her home on 7/2 when her walker slipped out from underneath her causing her to fall.  Patient endorses hitting her head and is unsure if she had any loss of consciousness.  Patient reports that she was unable to get herself off the floor.  Patient states that she has felt multiple days on the floor.  While on the floor patient was unable to take her home medications and disconnected from her home oxygen.  Patient endorses pain to lumbar back however reports that this pain is chronic for her and largely unchanged.       Fall Pertinent negatives include no chest pain, no abdominal pain, no headaches and no shortness of breath.       Home Medications Prior to Admission medications   Medication Sig Start Date End Date Taking? Authorizing Provider  metoprolol succinate (TOPROL-XL) 50 MG 24 hr tablet TAKE ONE TABLET TWICE A DAY WITH OR IMMEDIATELY FOLLOWING A MEAL. 11/02/21   Wardell Honour, MD  acetaminophen (TYLENOL) 325 MG tablet Take 2 tablets (650 mg total) by mouth every 6 (six) hours as needed for mild pain (or Fever >/= 101). 01/23/20   Domenic Polite, MD  ALPRAZolam Duanne Moron) 0.5 MG tablet Take 0.5 tablets (0.25 mg total) by mouth at bedtime as needed for up to 30 doses for sleep. 10/07/21   Shelly Coss, MD  busPIRone (BUSPAR) 5 MG tablet Take 5 mg by mouth 3 (three) times daily.     [provider]  clopidogrel (PLAVIX) 75 MG tablet Take 1 tablet (75 mg total) by mouth daily. 09/14/21   Hiilani Friar, PA-C  fluticasone (FLOVENT HFA) 110 MCG/ACT inhaler Inhale 1 puff into the lungs 2 (two) times daily.    [provider]  furosemide (LASIX) 20 MG tablet Take 1 tablet (20 mg total) by mouth daily. 10/07/21 10/02/22  Shelly Coss, MD  glucose blood (ACCU-CHEK AVIVA PLUS) test strip Use to test blood sugar four times daily. Dx: E11.9 03/07/21   Wardell Honour, MD  insulin aspart (NOVOLOG FLEXPEN) 100 UNIT/ML FlexPen INJECT 12-14 UNITS UNDER THE SKIN AT BREAKFAST, 14-16 UNITS AT LUNCH, AND 12-16 UNITS AT DINNER. Patient taking differently: Inject 12-16 Units into the skin See admin instructions. 12-14 units at breakfast, 14-16 units at lunch, and 12-16 units at dinner 12/08/20   Lauree Chandler, NP  insulin degludec (TRESIBA FLEXTOUCH) 200 UNIT/ML FlexTouch Pen Inject 36 Units into the skin at bedtime. 11/18/20   Lauree Chandler, NP  ipratropium-albuterol (DUONEB) 0.5-2.5 (3) MG/3ML SOLN 1 vial in neb every 8 hours and as needed Patient taking differently: Take 3 mLs by nebulization every 8 (eight) hours as needed (wheezing/shortness of breath). 01/17/21   Deneise Lever, MD  irbesartan (AVAPRO) 75 MG tablet Take 75 mg by mouth daily.    [provider]  levalbuterol Penne Lash HFA) 45 MCG/ACT inhaler INHALE  1-2 PUFFS EVERY 6 HOURS IF NEEDED FOR WHEEZING. Patient taking differently: Inhale 1-2 puffs into the lungs every 6 (six) hours as needed for wheezing. 05/31/21   Deneise Lever, MD  mirabegron ER (MYRBETRIQ) 50 MG TB24 tablet Take 1 tablet (50 mg total) by mouth daily. 09/16/21   Wardell Honour, MD  Multiple Vitamin (MULTIVITAMIN WITH MINERALS) TABS tablet Take 1 tablet by mouth daily. 08/14/21   Pokhrel, Corrie Mckusick, MD  pantoprazole (PROTONIX) 40 MG tablet Take 1 tablet (40 mg total) by mouth daily. 08/13/21 08/13/22  Pokhrel, Corrie Mckusick, MD   polyethylene glycol powder (GLYCOLAX/MIRALAX) 17 GM/SCOOP powder Take 17 g by mouth daily.    [provider]  pravastatin (PRAVACHOL) 20 MG tablet Take 1 tablet (20 mg total) by mouth daily. 02/28/21   Wardell Honour, MD  thiamine 100 MG tablet Take 1 tablet (100 mg total) by mouth daily. 08/14/21   Pokhrel, Corrie Mckusick, MD  Tiotropium Bromide-Olodaterol (STIOLTO RESPIMAT) 2.5-2.5 MCG/ACT AERS Inhale 2 puffs into the lungs daily. 10/20/21   Deneise Lever, MD  VENTOLIN HFA 108 (90 Base) MCG/ACT inhaler Inhale 2 puffs into the lungs every 6 (six) hours as needed for wheezing or shortness of breath. 08/24/21   [provider]      Allergies    Chlordiazepoxide-clidinium, Biaxin [clarithromycin], Tramadol, and Codeine    Review of Systems   Review of Systems  Constitutional:  Negative for chills and fever.  HENT:  Negative for facial swelling.   Eyes:  Negative for visual disturbance.  Respiratory:  Negative for shortness of breath.   Cardiovascular:  Negative for chest pain, palpitations and leg swelling.  Gastrointestinal:  Positive for nausea and vomiting. Negative for abdominal pain.  Genitourinary:  Negative for difficulty urinating, dysuria, frequency and urgency.  Musculoskeletal:  Positive for back pain. Negative for neck pain.  Skin:  Negative for color change, pallor, rash and wound.  Neurological:  Negative for dizziness, syncope, light-headedness and headaches.  Psychiatric/Behavioral:  Negative for confusion.     Physical Exam Updated Vital Signs Ht 5' 3"  (1.6 m)   Wt 58.1 kg   BMI 22.67 kg/m  Physical Exam Vitals and nursing note reviewed.  Constitutional:      General: She is not in acute distress.    Appearance: She is not ill-appearing, toxic-appearing or diaphoretic.  HENT:     Head: Normocephalic and atraumatic. No raccoon eyes, Battle's sign, abrasion, contusion, right periorbital erythema, left periorbital erythema or laceration.  Eyes:      General: No scleral icterus.       Right eye: No discharge.        Left eye: No discharge.  Cardiovascular:     Rate and Rhythm: Tachycardia present.     Pulses:          Carotid pulses are 2+ on the right side and 2+ on the left side. Pulmonary:     Effort: Pulmonary effort is normal. Tachypnea present. No bradypnea or respiratory distress.     Breath sounds: Normal breath sounds. No stridor.  Chest:     Chest wall: No mass, lacerations, deformity, swelling, tenderness, crepitus or edema.  Abdominal:     General: Abdomen is scaphoid. There is no distension. There are no signs of injury.     Palpations: Abdomen is soft. There is no mass or pulsatile mass.     Tenderness: There is no abdominal tenderness. There is no guarding or rebound.     Hernia: There  is no hernia in the umbilical area or ventral area.  Musculoskeletal:     Right lower leg: No edema.     Left lower leg: No edema.     Comments: No midline tenderness or deformity to cervical, thoracic, or lumbar spine.  Contusion noted to thoracic back, no surrounding tenderness.   Pelvis stable.  No leg length discrepancy.  No tenderness or bony tenderness to bilateral upper or lower extremities.  Skin:    General: Skin is warm and dry.  Neurological:     General: No focal deficit present.     Mental Status: She is alert and oriented to person, place, and time.     GCS: GCS eye subscore is 4. GCS verbal subscore is 5. GCS motor subscore is 6.     Cranial Nerves: Cranial nerves 2-12 are intact. No cranial nerve deficit, dysarthria or facial asymmetry.     Comments: True strength equal.  Patient able to hold and lift bilateral lower legs against gravity without difficulty.  Psychiatric:        Behavior: Behavior is cooperative.     ED Results / Procedures / Treatments   Labs (all labs ordered are listed, but only abnormal results are displayed) Labs Reviewed  CBG MONITORING, ED - Abnormal; Notable for the following  components:      Result Value   Glucose-Capillary 485 (*)    All other components within normal limits  I-STAT VENOUS BLOOD GAS, ED - Abnormal; Notable for the following components:   pH, Ven 7.204 (*)    Bicarbonate 17.4 (*)    TCO2 19 (*)    Acid-base deficit 10.0 (*)    Sodium 122 (*)    Potassium >8.5 (*)    Calcium, Ion 0.86 (*)    HCT 56.0 (*)    Hemoglobin 19.0 (*)    All other components within normal limits  CULTURE, BLOOD (ROUTINE X 2)  CULTURE, BLOOD (ROUTINE X 2)  URINE CULTURE  COMPREHENSIVE METABOLIC PANEL  CBC WITH DIFFERENTIAL/PLATELET  CK  URINALYSIS, ROUTINE W REFLEX MICROSCOPIC  LACTIC ACID, PLASMA  LACTIC ACID, PLASMA  OSMOLALITY  BETA-HYDROXYBUTYRIC ACID  BLOOD GAS, VENOUS  TROPONIN I (HIGH SENSITIVITY)    EKG EKG Interpretation  Date/Time:  Wednesday November 09 2021 13:18:42 EDT Ventricular Rate:  126 PR Interval:    QRS Duration: 163 QT Interval:  409 QTC Calculation: 593 R Axis:   -86 Text Interpretation: VENTRICULAR PACED RHYTHM Confirmed by Pattricia Boss [phone]) on 11/09/2021 1:47:12 PM  Radiology DG CHEST PORT 1 VIEW  Result Date: 11/09/2021 CLINICAL DATA:  Provided history: Fall, pain, history of COPD, on home oxygen. EXAM: PORTABLE CHEST 1 VIEW COMPARISON:  Prior chest radiographs 10/02/2021 and earlier. FINDINGS: Left chest dual lead implantable cardiac device. Heart size within normal limits. Aortic atherosclerosis. No appreciable airspace consolidation or pulmonary edema. No evidence of pleural effusion or pneumothorax. No acute bony abnormality identified. Degenerative changes of the spine. Mild thoracic levocurvature. IMPRESSION: No evidence of acute cardiopulmonary abnormality. Aortic Atherosclerosis (ICD10-I70.0). Thoracic spondylosis and levocurvature. Electronically Signed   By: Kellie Simmering D.O.   On: 11/09/2021 13:56   DG Pelvis Portable  Result Date: 11/09/2021 CLINICAL DATA:  Fall 3 days ago. EXAM: PORTABLE PELVIS 1-2 VIEWS  COMPARISON:  CT of 11/08/2016 FINDINGS: Femoral heads are located. No acute fracture. Sacroiliac joints are symmetric. Degenerate disc disease at multiple lower lumbar levels and lumbosacral junction. IMPRESSION: No acute osseous abnormality. Electronically Signed   By: Marylyn Ishihara  Jobe Igo M.D.   On: 11/09/2021 13:56    Procedures .Critical Care  Performed by: Loni Beckwith, PA-C Authorized by: Loni Beckwith, PA-C   Critical care provider statement:    Critical care time (minutes):  30   Critical care was necessary to treat or prevent imminent or life-threatening deterioration of the following conditions:  Metabolic crisis and dehydration   Critical care was time spent personally by me on the following activities:  Development of treatment plan with patient or surrogate, evaluation of patient's response to treatment, examination of patient, ordering and review of laboratory studies, ordering and review of radiographic studies, ordering and performing treatments and interventions, pulse oximetry, re-evaluation of patient's condition, review of old charts and obtaining history from patient or surrogate     Medications Ordered in ED Medications  sodium chloride 0.9 % bolus 1,000 mL (1,000 mLs Intravenous New Bag/Given 11/09/21 1340)  sodium bicarbonate injection 50 mEq (50 mEq Intravenous Given 11/09/21 1359)  calcium gluconate inj 10% (1 g) URGENT USE ONLY! (1 g Intravenous Given 11/09/21 1400)  insulin aspart (novoLOG) injection 10 Units (10 Units Intravenous Given 11/09/21 1401)    ED Course/ Medical Decision Making/ A&P Clinical Course as of 11/09/21 1522  Wed Nov 09, 2021  1521 Lab samples were hemolyzed and therefore need to be recollected.  Patient's RN was made aware.  We will check i-STAT Chem-8 to quickly reevaluate patient's labs. [PB]    Clinical Course User Index [PB] Loni Beckwith, PA-C                           Medical Decision Making Amount and/or Complexity of Data  Reviewed Labs: ordered. Radiology: ordered.  Risk OTC drugs. Prescription drug management.   Alert 86 year old female, ill-appearing.  Is tachycardic at rate of 126, tachypneic with respiratory rate of 30 and has a temperature of 96.  Presents emergency department after suffering a fall and spending multiple days on the floor.  Information was obtained from patient.  I reviewed patient's past medical records including previous provider notes, labs, and imaging.  Patient has medical history  With patient spending prolonged period of time on the ground concern for rhabdomyolysis and dehydration.  With patient not taking her insulin and CBG greater than 500 with EMS concern for possible DKA.  With tachycardia and temperature of 96 F sepsis is also considered as part of the differential diagnosis.  I personally viewed and interpreted patient's EKG.  Tracing shows ventricular paced rhythm.  I personally viewed and interpreted patient's x-ray imaging.  Imaging shows no acute cardiopulmonary disease.  No acute osseous abnormality of pelvis.  I personally viewed and interpreted patient's lab results.  Pertinent findings include: -VBG shows pH 7.2, potassium greater than 8.5, sodium 122, bicarb 17.4, hematocrit 56, hemoglobin 19  With potassium greater than 3.5 amp of sodium bicarb and calcium gluconate were given to the patient.  We will give patient 10 units of insulin to help decrease potassium.  Patient given 1 L fluid bolus to help with hyperglycemia as well as hyperkalemia.  With hyponatremia new from 12 days prior rehydration will need to be performed slowly to avoid rapid correction of hyponatremia.  Patient's labs are pending.  Patient will need admission for likely rhabdo, renal failure, and DKA.  Patient was discussed with and evaluated by Dr. Jeanell Sparrow.   Patient care transferred to PA Ribas at the end of my shift. Patient presentation, ED course,  and plan of care discussed with review of  all pertinent labs and imaging. Please see his/her note for further details regarding further ED course and disposition.         Final Clinical Impression(s) / ED Diagnoses Final diagnoses:  Fall, initial encounter  Hyperkalemia  Hyperglycemia    Rx / DC Orders ED Discharge Orders     None         Dyann Ruddle 11/09/21 1523    Pattricia Boss, MD 11/09/21 [phone]

## 2021-11-09 NOTE — ED Notes (Signed)
Pt asking for food  I was told by the off going rn that the pt was npo  she reports that she is starving  she has not eaten for  3 days

## 2021-11-09 NOTE — ED Triage Notes (Signed)
Patient fell 3 days ago and has been in the floor since.  Patient has bruises noted all over.  COmplains of pain all over.  Wears 2L [place] at all times due to COPD and walks with a walker.  CBG was high with ems.

## 2021-11-09 NOTE — ED Notes (Signed)
Patient had large BM. Cleaned patient of incontinence and placed a new brief on patient.

## 2021-11-09 NOTE — H&P (Signed)
Date: 11/09/2021               Patient Name:  [name] MRN: [id number]  DOB: Apr 03, 1936 Age / Sex: 86 y.o., female   PCP: Wardell Honour, MD         Medical Service: Internal Medicine Teaching Service         Attending Physician: Dr. Sid Falcon, MD    First Contact: Gaylyn Rong, MD      Pager: Dorothea Ogle [phone]      Second Contact: Linwood Dibbles, MD      Pager: PA [phone]           After Hours (After 5p/  First Contact Pager: [phone]  weekends / holidays): Second Contact Pager: [phone]   SUBJECTIVE   Chief Complaint: Unwitnessed fall, on the ground for 3 days.  Found to have DKA and UTI.  History of Present Illness: [name] is an 86 year old female with past medical history of uncontrolled type 2 diabetes, COPD on home oxygen, fatty liver disease, HLD, HTN, complete heart block s/p pacemaker who presents to the ED following 3 days down after a fall.  She states that she was at home when her walker "flew up and hit her in the head" and she lost consciousness.  She later read and was down on the floor for 3 days.  She was unable to contact emergency services using her phone.  She endorses some confusion, area, and acute left shoulder pain, but denies chest pain, cough, shortness of breath, abdominal pain nausea vomiting or diarrhea.  She mentions she has not eaten or had fluid intake or taking any of her medications in this period of time when she was down.  Upon questioning she stated that she would like to be DNR CODE STATUS.  Of note she was recently admitted for DKA between May 28 and June 2 and also presented to the ED on June 23 with chief complaint of hypoglycemia.  She is uncertain about which her medications she takes at home, and is uncertain about her home home diabetes regimen.  She says she is follows up with Dr. Sabra Heck her PCP to manage this regimen.  ED Course:  She was brought to the ED after being found on the ground today.  Upon  initial assessment blood pressure was 173/70, pulse 121, O2 sat 98% on 2 L nasal cannula, initial temperature 96 Fahrenheit, respiratory rate 25.  Initial physical exam No obvious signs of trauma.  Urgent work-up is as follows: White blood count 51.7 with neutrophilic predominance, hemoglobin 17.3, lactic acid 3.5, beta hydroxybutyrate greater than 8, bicarb 17.4 and pH 7.204 on VBG, CBG 45. UA showing urine glucose above upper limit, positive ketones, and positive leukocytes, white blood cells, and bacteria.  CK 187.  Initial sodium and potassium hemolyzed but follow-up CMP showing potassium 5.6, sodium 141, creatinine 1.21, with anion gap of 33.  Serum osmolality 331.  Initial EKG shows ventricular pacing consistent with prior EKGs and initial troponins were negative. Initial imaging including chest x-ray pelvic x-ray CT head without contrast and CT cervical spine without contrast unrevealing for signs of acute trauma.  She was given 3 L of fluid bolus and started on fluid and fluid infusion, given calcium gluconate, started on ceftriaxone for UTI, and given 10 units of insulin as well as 50 mEq sodium bicarb. Meds:  Current Meds  Medication Sig   aspirin EC 81 MG tablet Take 81 mg by mouth daily.  Swallow whole.    Past Medical History  Past Surgical History:  Procedure Laterality Date   APPENDECTOMY     CARPAL TUNNEL RELEASE     right hand   CHOLECYSTECTOMY OPEN  1978   COLONOSCOPY  07-2001   mild diverticulosis   ESOPHAGOGASTRODUODENOSCOPY  1941,74-08   H Hernia,es.stricture s/p dil 11F   INTRAOCULAR LENS INSERTION Bilateral    LIVER BIOPSY  [id number]   PACEMAKER IMPLANT N/A 01/14/2020   Procedure: PACEMAKER IMPLANT;  Surgeon: Evans Lance, MD;  Location: [facility];  Service: Cardiovascular;  Laterality: N/A;   PARATHYROID EXPLORATION     TONSILLECTOMY AND ADENOIDECTOMY     TOTAL ABDOMINAL HYSTERECTOMY     ULNAR NERVE TRANSPOSITION  12/07/2011   Procedure: ULNAR NERVE  DECOMPRESSION/TRANSPOSITION;this was cancelled-not done  Surgeon: Cammie Sickle., MD;  Location: [facility];  Service: Orthopedics;  Laterality: Right;  right ulnar nerve in situ decompression   ULNAR TUNNEL RELEASE  03/07/2012   Procedure: CUBITAL TUNNEL RELEASE;  Surgeon: Roseanne Kaufman, MD;  Location: [facility];  Service: Orthopedics;  Laterality: Right;  ulnar nerve release at the elbow      Social:  Lives With: Herself and with cat Occupation: Retired Support: Seems to be minimal support, reports that she has an Engineer, production however does not receive much help be on transportation Level of Function: Reports that she performs her own ADLs and IADLs however this is uncertain PCP: Alain Honey, MD Substances: None  Allergies: Allergies as of 11/09/2021 - Review Complete 11/09/2021  Allergen Reaction Noted   Chlordiazepoxide-clidinium Other (See Comments) 02/27/2011   Biaxin [clarithromycin] Other (See Comments) 09/03/2012   Tape Other (See Comments) 11/09/2021   Tramadol Other (See Comments) 09/28/2010   Codeine Other (See Comments) 03/16/2010    Review of Systems: A complete ROS was negative except as per HPI.   OBJECTIVE:   Physical Exam: Blood pressure 128/74, pulse 80, temperature 99.2 F (37.3 C), resp. rate (!) 26, height 5' 3"  (1.6 m), weight 58.1 kg, SpO2 99 %.  Constitutional: Frail-appearing female lying in bed, in no acute distress.  Responds to questions and interactive Cardiovascular: Ventricular pacing, extremity pulses 2+ Pulmonary/Chest: Increased work of breathing on room air, lungs clear to auscultation bilaterally Abdominal: soft, non-tender, non-distended Extremities: [place] to left shoulder which worsens with abduction.  Decreased sensation in her surface of feet bilaterally without any visible ulcers or wounds.  Labs: CBC  Latest Reference Range & Units 11/09/21 13:30  WBC 4.0 - 10.5 K/uL 18.3 (H)  RBC 3.87 - 5.11 MIL/uL  5.99 (H)  Hemoglobin 12.0 - 15.0 g/dL 17.3 (H)  HCT 36.0 - 46.0 % 55.5 (H)  MCV 80.0 - 100.0 fL 92.7  MCH 26.0 - 34.0 pg 28.9  MCHC 30.0 - 36.0 g/dL 31.2  RDW 11.5 - 15.5 % 14.8  Platelets 150 - 400 K/uL 336  nRBC 0.0 - 0.2 % 0.0  Neutrophils % 88  Lymphocytes % 6  Monocytes Relative % 4  Eosinophil % 0  Basophil % 1  Immature Granulocytes % 1  NEUT# 1.7 - 7.7 K/uL 16.3 (H)  Lymphocyte # 0.7 - 4.0 K/uL 1.0  Monocyte # 0.1 - 1.0 K/uL 0.8  Eosinophils Absolute 0.0 - 0.5 K/uL 0.0  Basophils Absolute 0.0 - 0.1 K/uL 0.1  Abs Immature Granulocytes 0.00 - 0.07 K/uL 0.15 (H)  (H): Data is abnormally high  CMP   Latest Reference Range & Units 11/09/21 15:32  COMPREHENSIVE METABOLIC PANEL  Rpt !  Sodium 135 - 145 mmol/L 141  Potassium 3.5 - 5.1 mmol/L 5.6 (H)  Chloride 98 - 111 mmol/L 96 (L)  CO2 22 - 32 mmol/L 12 (L)  Glucose 70 - 99 mg/dL 415 (H)  BUN 8 - 23 mg/dL 19  Creatinine 0.44 - 1.00 mg/dL 1.21 (H)  Calcium 8.9 - 10.3 mg/dL 9.8  Anion gap 5 - 15  33 (H)  Alkaline Phosphatase 38 - 126 U/L 63  Albumin 3.5 - 5.0 g/dL 3.3 (L)  AST 15 - 41 U/L 17  ALT 0 - 44 U/L 11  Total Protein 6.5 - 8.1 g/dL 6.6  Total Bilirubin 0.3 - 1.2 mg/dL 1.8 (H)  GFR, Estimated >60 mL/min 44 (L)  !: Data is abnormal (H): Data is abnormally high (L): Data is abnormally low Rpt: View report in Results Review for more information Rpt: View report in Results Review for more information  lactic acid 3.5 beta hydroxybutyrate> 8 VBG: bicarb 17.4 and pH 7.204  UA showing urine glucose above upper limit, positive ketones, and positive leukocytes, white blood cells, and bacteria CK 187 Imaging: DG Shoulder Left  Result Date: 11/09/2021 CLINICAL DATA:  Fall onto the arm with limited range of motion. EXAM: LEFT SHOULDER - 2+ VIEW COMPARISON:  Shoulder radiographs dated 08/06/2019. FINDINGS: There is no evidence of fracture or dislocation. There is no evidence of arthropathy or other focal bone  abnormality. Soft tissues are unremarkable. A left subclavian approach cardiac device is noted. Atherosclerotic disease is noted in the aortic arch. IMPRESSION: No acute osseous injury. Aortic Atherosclerosis (ICD10-I70.0). Electronically Signed   By: Zerita Boers M.D.   On: 11/09/2021 18:25   CT Cervical Spine Wo Contrast  Result Date: 11/09/2021 CLINICAL DATA:  [place] 3 days ago, found down, bruising EXAM: CT CERVICAL SPINE WITHOUT CONTRAST TECHNIQUE: Multidetector CT imaging of the cervical spine was performed without intravenous contrast. Multiplanar CT image reconstructions were also generated. RADIATION DOSE REDUCTION: This exam was performed according to the departmental dose-optimization program which includes automated exposure control, adjustment of the mA and/or kV according to patient size and/or use of iterative reconstruction technique. COMPARISON:  06/21/2017 FINDINGS: Alignment: Slight reversal cervical lordosis centered at the C4 level likely due to adjacent degenerative changes. Otherwise alignment is anatomic. Skull base and vertebrae: No acute fracture. No primary bone lesion or focal pathologic process. Soft tissues and spinal canal: No prevertebral fluid or swelling. No visible canal hematoma. Disc levels: Prominent spondylosis at C4-5, C5-6, and C6-7. Facet hypertrophic changes greatest at C2-3, C3-4, and C4-5. Upper chest: Airway is patent. Emphysematous changes at the lung apices. Other: Reconstructed images demonstrate no additional findings. IMPRESSION: 1. Multilevel cervical degenerative changes as above. No acute cervical spine fracture. Electronically Signed   By: Randa Ngo M.D.   On: 11/09/2021 15:02   CT HEAD WO CONTRAST (5MM)  Result Date: 11/09/2021 CLINICAL DATA:  [place] 3 days ago, found down, bruising EXAM: CT HEAD WITHOUT CONTRAST TECHNIQUE: Contiguous axial images were obtained from the base of the skull through the vertex without intravenous contrast. RADIATION DOSE  REDUCTION: This exam was performed according to the departmental dose-optimization program which includes automated exposure control, adjustment of the mA and/or kV according to patient size and/or use of iterative reconstruction technique. COMPARISON:  08/09/2021 FINDINGS: Brain: No evidence of acute infarction, hemorrhage, hydrocephalus, extra-axial collection or mass lesion/mass effect. Vascular: No hyperdense vessel or unexpected calcification. Skull: Normal. Negative for fracture or focal lesion.  Sinuses/Orbits: No acute finding. Other: None. IMPRESSION: 1. Stable head CT, no acute intracranial process. Electronically Signed   By: Randa Ngo M.D.   On: 11/09/2021 14:56   DG CHEST PORT 1 VIEW  Result Date: 11/09/2021 CLINICAL DATA:  Provided history: Fall, pain, history of COPD, on home oxygen. EXAM: PORTABLE CHEST 1 VIEW COMPARISON:  Prior chest radiographs 10/02/2021 and earlier. FINDINGS: Left chest dual lead implantable cardiac device. Heart size within normal limits. Aortic atherosclerosis. No appreciable airspace consolidation or pulmonary edema. No evidence of pleural effusion or pneumothorax. No acute bony abnormality identified. Degenerative changes of the spine. Mild thoracic levocurvature. IMPRESSION: No evidence of acute cardiopulmonary abnormality. Aortic Atherosclerosis (ICD10-I70.0). Thoracic spondylosis and levocurvature. Electronically Signed   By: Kellie Simmering D.O.   On: 11/09/2021 13:56   DG Pelvis Portable  Result Date: 11/09/2021 CLINICAL DATA:  Fall 3 days ago. EXAM: PORTABLE PELVIS 1-2 VIEWS COMPARISON:  CT of 11/08/2016 FINDINGS: Femoral heads are located. No acute fracture. Sacroiliac joints are symmetric. Degenerate disc disease at multiple lower lumbar levels and lumbosacral junction. IMPRESSION: No acute osseous abnormality. Electronically Signed   By: Abigail Miyamoto M.D.   On: 11/09/2021 13:56     EKG: personally reviewed my interpretation is ventricular pacing system with  prior EKGs.  ASSESSMENT & PLAN:   Assessment & Plan by Problem: Principal Problem:   DKA (diabetic ketoacidosis) (HCC)   [name] is a 86 y.o. female with a history of uncontrolled type 2 diabetes, COPD on home oxygen, fatty liver disease, HLD, HTN, CVA, complete heart block s/p pacemaker placement who presented after being found on the ground 3 days after a fall/loss of consciousness and admitted for DKA on hospital day 0.  #DKA secondary to uncontrolled type 2 diabetes #Hyperkalemia Patient had unwitnessed fall and was found on the ground and uncertain amount of time (likely 3 days) later.  She states that she has not eaten anything, taking any of her home diabetes medications, or had any fluid intake in that time.  Has had multiple presentations for DKA in the past with chronically uptrending A1c with most recent value is greater than 15. Initial work-up in ED showing anion gap metabolic acidosis, high beta hydroxybutyrate, multiple glucose values in the 400s, urine ketones, and serum osmolality 331 diagnostic for DKA.  Aggressive fluid resuscitation, insulin, calcium gluconate started in the ED to start DKA management. Plan 1.  Insulin drip until 2 consecutive closed anion gap on BMP 2.  Continue aggressive fluid resuscitation with 125 mL/h of LR 3.  Strict I's and O's and n.p.o. until transitioned off insulin drip 4.  Continue to monitor electrolytes with BMP for, beta hydroxybutyric acid every 8  #UTI Patient found down after 3 days, uncertain urinary retention and fluid intake.  Patient reporting dysuria and mild lower abdominal pain. Initial work-up showing UA consistent with UTI as well as white blood cell count of 18.3.  Will treat as complicated UTI. Plan 1.  Start treatment with IV ceftriaxone 2.  Follow-up on urine cultures 3.  Broaden coverage and obtain blood cultures x2 in case patient is febrile or shows signs of sepsis.  #AKI Patient with a fluid intake for 3  days.  Creatinine 1.21 up from baseline of 0.7-0.9 with BUN over creatinine ratio of less than 20.  Possible causes include severe dehydration and hypovolemia in the context of DKA versus ATN. Plan 1.  Continue aggressive fluid resuscitation as part of DKA protocol 2.  Monitor  serum creatinine to confirm resolution of AKI  #Left shoulder pain following fall Patient complained of acute left shoulder pain following a fall.  Subsequent shoulder x-ray showed no signs of soft tissue trauma or fracture.  We will treat pain conservatively. Plan 1.  Tylenol 650 mg every 6 PRN  #COPD Patient unsure how much oxygen she takes at home.  Satting 99% on 2 L nasal cannula.  Continue home regimen as needed Plan 1.  Albuterol nebs every 6 as needed and Brovana nebs twice daily plus Ellipta once daily 2.  Maintain oxygen between 88 and 92%  #Hypertension #Complete heart block status post pacemaker placement Blood pressures stabilized after initial resuscitation in the ED.  Down to 846 systolic and tachycardic in the 100s to 110.  Ventricularly paced rhythm. - Start metoprolol 50 mg extended release daily.  Watch for bradycardia or sudden blood pressure drops. - Hold home irbesartan and Lasix in the context of AKI  #HLD - Continue home pravastatin 20 mg daily  #GERD - Continue home Protonix 40 mg daily  #History of CVA in April 2023 - Continue home DAPT with aspirin and Plavix  #Anxiety - Continue home Buspar 49m TID - Hold home Xanax consider other options if patient reports sleep issues   Diet: NPO VTE:  Lovenox IVF: LR,125cc/hr with fluid boluses as needed in the context of severe dehydration and DKA Code: DNR  Prior to Admission Living Arrangement: Home, living alone with her cat Anticipated Discharge Location: Pending PT and OT evaluation Barriers to Discharge: [facility] and [facility] planning with diabetes education and PT and OT evaluation  Dispo: Admit  patient to Inpatient with expected length of stay greater than 2 midnights.  Signed: SLinus Galas MD Internal Medicine Resident PGY-1  11/09/2021, 6:55 PM

## 2021-11-09 NOTE — Sepsis Progress Note (Signed)
Elink following Code Sepsis. 

## 2021-11-09 NOTE — ED Notes (Signed)
Debbe Mounts PA shown results of VBG. ED-Lab.

## 2021-11-09 NOTE — ED Provider Notes (Signed)
Patient taken at shift handoff from [facility]. Patient apparently fell at home 2 days ago, was found down on the floor.  Imaging is negative.  Awaiting lab results.  Initial VBG showed significant hyperkalemia and hyponatremia therefore fluids were given judiciously. Repeat Chem-8 showed fairly normal sodium of 134 therefore I increased fluid resuscitation.  She has elevated blood glucose, elevated white blood cell count, elevated hemoglobin likely secondary to volume contraction, anion gap acidosis of 33, elevated lactic acid level and urine appears infected therefore I ordered a sepsis work-up with increased fluid resuscitation and 2 g IV Rocephin.  The patient will be admitted to the internal medicine resident service with whom I have discussed findings.   Zula, Hovsepian, PA-C 11/09/21 2105    Isla Pence, MD 11/09/21 2222

## 2021-11-09 NOTE — ED Notes (Signed)
Patient keeps bending arm and fluids are not flowing so switched to IV in the left hand

## 2021-11-10 ENCOUNTER — Observation Stay (HOSPITAL_COMMUNITY): Payer: Medicare Other

## 2021-11-10 ENCOUNTER — Other Ambulatory Visit: Payer: Self-pay | Admitting: *Deleted

## 2021-11-10 DIAGNOSIS — R739 Hyperglycemia, unspecified: Secondary | ICD-10-CM | POA: Diagnosis not present

## 2021-11-10 DIAGNOSIS — K7689 Other specified diseases of liver: Secondary | ICD-10-CM | POA: Diagnosis not present

## 2021-11-10 DIAGNOSIS — G8929 Other chronic pain: Secondary | ICD-10-CM | POA: Diagnosis present

## 2021-11-10 DIAGNOSIS — K219 Gastro-esophageal reflux disease without esophagitis: Secondary | ICD-10-CM | POA: Diagnosis present

## 2021-11-10 DIAGNOSIS — Z950 Presence of cardiac pacemaker: Secondary | ICD-10-CM | POA: Diagnosis not present

## 2021-11-10 DIAGNOSIS — I10 Essential (primary) hypertension: Secondary | ICD-10-CM | POA: Diagnosis present

## 2021-11-10 DIAGNOSIS — Z794 Long term (current) use of insulin: Secondary | ICD-10-CM | POA: Diagnosis not present

## 2021-11-10 DIAGNOSIS — Z20822 Contact with and (suspected) exposure to covid-19: Secondary | ICD-10-CM | POA: Diagnosis present

## 2021-11-10 DIAGNOSIS — L97519 Non-pressure chronic ulcer of other part of right foot with unspecified severity: Secondary | ICD-10-CM | POA: Diagnosis present

## 2021-11-10 DIAGNOSIS — N179 Acute kidney failure, unspecified: Secondary | ICD-10-CM | POA: Diagnosis present

## 2021-11-10 DIAGNOSIS — K3189 Other diseases of stomach and duodenum: Secondary | ICD-10-CM | POA: Diagnosis not present

## 2021-11-10 DIAGNOSIS — E785 Hyperlipidemia, unspecified: Secondary | ICD-10-CM | POA: Diagnosis present

## 2021-11-10 DIAGNOSIS — K760 Fatty (change of) liver, not elsewhere classified: Secondary | ICD-10-CM | POA: Diagnosis present

## 2021-11-10 DIAGNOSIS — N390 Urinary tract infection, site not specified: Secondary | ICD-10-CM | POA: Diagnosis present

## 2021-11-10 DIAGNOSIS — Y92009 Unspecified place in unspecified non-institutional (private) residence as the place of occurrence of the external cause: Secondary | ICD-10-CM | POA: Diagnosis not present

## 2021-11-10 DIAGNOSIS — E11621 Type 2 diabetes mellitus with foot ulcer: Secondary | ICD-10-CM | POA: Diagnosis present

## 2021-11-10 DIAGNOSIS — Z7189 Other specified counseling: Secondary | ICD-10-CM | POA: Diagnosis not present

## 2021-11-10 DIAGNOSIS — L89152 Pressure ulcer of sacral region, stage 2: Secondary | ICD-10-CM | POA: Diagnosis present

## 2021-11-10 DIAGNOSIS — R54 Age-related physical debility: Secondary | ICD-10-CM | POA: Diagnosis present

## 2021-11-10 DIAGNOSIS — E1110 Type 2 diabetes mellitus with ketoacidosis without coma: Secondary | ICD-10-CM | POA: Diagnosis present

## 2021-11-10 DIAGNOSIS — W19XXXS Unspecified fall, sequela: Secondary | ICD-10-CM | POA: Diagnosis not present

## 2021-11-10 DIAGNOSIS — E1142 Type 2 diabetes mellitus with diabetic polyneuropathy: Secondary | ICD-10-CM | POA: Diagnosis present

## 2021-11-10 DIAGNOSIS — Z66 Do not resuscitate: Secondary | ICD-10-CM | POA: Diagnosis present

## 2021-11-10 DIAGNOSIS — R1900 Intra-abdominal and pelvic swelling, mass and lump, unspecified site: Secondary | ICD-10-CM | POA: Diagnosis not present

## 2021-11-10 DIAGNOSIS — N3289 Other specified disorders of bladder: Secondary | ICD-10-CM | POA: Diagnosis not present

## 2021-11-10 DIAGNOSIS — W19XXXA Unspecified fall, initial encounter: Secondary | ICD-10-CM | POA: Diagnosis not present

## 2021-11-10 DIAGNOSIS — E875 Hyperkalemia: Secondary | ICD-10-CM | POA: Diagnosis present

## 2021-11-10 DIAGNOSIS — Z8673 Personal history of transient ischemic attack (TIA), and cerebral infarction without residual deficits: Secondary | ICD-10-CM | POA: Diagnosis not present

## 2021-11-10 DIAGNOSIS — R1310 Dysphagia, unspecified: Secondary | ICD-10-CM | POA: Diagnosis present

## 2021-11-10 DIAGNOSIS — Z515 Encounter for palliative care: Secondary | ICD-10-CM | POA: Diagnosis not present

## 2021-11-10 DIAGNOSIS — J449 Chronic obstructive pulmonary disease, unspecified: Secondary | ICD-10-CM | POA: Diagnosis present

## 2021-11-10 DIAGNOSIS — R32 Unspecified urinary incontinence: Secondary | ICD-10-CM | POA: Diagnosis present

## 2021-11-10 DIAGNOSIS — Z9981 Dependence on supplemental oxygen: Secondary | ICD-10-CM | POA: Diagnosis not present

## 2021-11-10 DIAGNOSIS — W010XXA Fall on same level from slipping, tripping and stumbling without subsequent striking against object, initial encounter: Secondary | ICD-10-CM | POA: Diagnosis present

## 2021-11-10 DIAGNOSIS — I442 Atrioventricular block, complete: Secondary | ICD-10-CM | POA: Diagnosis present

## 2021-11-10 DIAGNOSIS — M25512 Pain in left shoulder: Secondary | ICD-10-CM | POA: Diagnosis not present

## 2021-11-10 DIAGNOSIS — F419 Anxiety disorder, unspecified: Secondary | ICD-10-CM | POA: Diagnosis present

## 2021-11-10 DIAGNOSIS — N3001 Acute cystitis with hematuria: Secondary | ICD-10-CM | POA: Diagnosis not present

## 2021-11-10 LAB — CBG MONITORING, ED
Glucose-Capillary: 104 mg/dL — ABNORMAL HIGH (ref 70–99)
Glucose-Capillary: 131 mg/dL — ABNORMAL HIGH (ref 70–99)
Glucose-Capillary: 142 mg/dL — ABNORMAL HIGH (ref 70–99)
Glucose-Capillary: 143 mg/dL — ABNORMAL HIGH (ref 70–99)
Glucose-Capillary: 145 mg/dL — ABNORMAL HIGH (ref 70–99)
Glucose-Capillary: 148 mg/dL — ABNORMAL HIGH (ref 70–99)
Glucose-Capillary: 150 mg/dL — ABNORMAL HIGH (ref 70–99)
Glucose-Capillary: 150 mg/dL — ABNORMAL HIGH (ref 70–99)

## 2021-11-10 LAB — BASIC METABOLIC PANEL
Anion gap: 12 (ref 5–15)
Anion gap: 8 (ref 5–15)
Anion gap: 15 (ref 5–15)
Anion gap: 9 (ref 5–15)
BUN: 13 mg/dL (ref 8–23)
BUN: 10 mg/dL (ref 8–23)
BUN: 11 mg/dL (ref 8–23)
BUN: 9 mg/dL (ref 8–23)
CO2: 24 mmol/L (ref 22–32)
CO2: 25 mmol/L (ref 22–32)
CO2: 26 mmol/L (ref 22–32)
CO2: 29 mmol/L (ref 22–32)
Calcium: 8.5 mg/dL — ABNORMAL LOW (ref 8.9–10.3)
Calcium: 7.6 mg/dL — ABNORMAL LOW (ref 8.9–10.3)
Calcium: 9 mg/dL (ref 8.9–10.3)
Calcium: 8.6 mg/dL — ABNORMAL LOW (ref 8.9–10.3)
Chloride: 103 mmol/L (ref 98–111)
Chloride: 109 mmol/L (ref 98–111)
Chloride: 101 mmol/L (ref 98–111)
Chloride: 100 mmol/L (ref 98–111)
Creatinine, Ser: 0.71 mg/dL (ref 0.44–1.00)
Creatinine, Ser: 0.61 mg/dL (ref 0.44–1.00)
Creatinine, Ser: 0.7 mg/dL (ref 0.44–1.00)
Creatinine, Ser: 0.67 mg/dL (ref 0.44–1.00)
GFR, Estimated: >60 mL/min (ref >60)
GFR, Estimated: >60 mL/min (ref >60)
GFR, Estimated: >60 mL/min (ref >60)
GFR, Estimated: >60 mL/min (ref >60)
Glucose, Bld: 125 mg/dL — ABNORMAL HIGH (ref 70–99)
Glucose, Bld: 113 mg/dL — ABNORMAL HIGH (ref 70–99)
Glucose, Bld: 190 mg/dL — ABNORMAL HIGH (ref 70–99)
Glucose, Bld: 127 mg/dL — ABNORMAL HIGH (ref 70–99)
Potassium: 5.5 mmol/L — ABNORMAL HIGH (ref 3.5–5.1)
Potassium: 3.4 mmol/L — ABNORMAL LOW (ref 3.5–5.1)
Potassium: 3.7 mmol/L (ref 3.5–5.1)
Potassium: 3.4 mmol/L — ABNORMAL LOW (ref 3.5–5.1)
Sodium: 139 mmol/L (ref 135–145)
Sodium: 142 mmol/L (ref 135–145)
Sodium: 142 mmol/L (ref 135–145)
Sodium: 138 mmol/L (ref 135–145)

## 2021-11-10 LAB — URINE CULTURE: Culture: >=100000 — AB

## 2021-11-10 LAB — GLUCOSE, CAPILLARY
Glucose-Capillary: 87 mg/dL (ref 70–99)
Glucose-Capillary: 166 mg/dL — ABNORMAL HIGH (ref 70–99)
Glucose-Capillary: 167 mg/dL — ABNORMAL HIGH (ref 70–99)

## 2021-11-10 LAB — CBC
HCT: 41 % (ref 36.0–46.0)
Hemoglobin: 13 g/dL (ref 12.0–15.0)
MCH: 29.1 pg (ref 26.0–34.0)
MCHC: 31.7 g/dL (ref 30.0–36.0)
MCV: 91.7 fL (ref 80.0–100.0)
Platelets: 146 10*3/uL — ABNORMAL LOW (ref 150–400)
RBC: 4.47 MIL/uL (ref 3.87–5.11)
RDW: 14.8 % (ref 11.5–15.5)
WBC: 10.9 10*3/uL — ABNORMAL HIGH (ref 4.0–10.5)
nRBC: 0 % (ref 0.0–0.2)

## 2021-11-10 LAB — BETA-HYDROXYBUTYRIC ACID
Beta-Hydroxybutyric Acid: 0.68 mmol/L — ABNORMAL HIGH (ref 0.05–0.27)
Beta-Hydroxybutyric Acid: 2.72 mmol/L — ABNORMAL HIGH (ref 0.05–0.27)

## 2021-11-10 MED ORDER — INSULIN GLARGINE-YFGN 100 UNIT/ML ~~LOC~~ SOLN
10.0000 [IU] | Freq: Every day | SUBCUTANEOUS | Status: DC
  Filled 2021-11-10: qty 0.1

## 2021-11-10 MED ORDER — INSULIN GLARGINE-YFGN 100 UNIT/ML ~~LOC~~ SOLN
10.0000 [IU] | Freq: Every day | SUBCUTANEOUS | Status: DC
  Administered 2021-11-10: 10 [IU] via SUBCUTANEOUS
  Filled 2021-11-10 (×2): qty 0.1

## 2021-11-10 MED ORDER — INSULIN ASPART 100 UNIT/ML IJ SOLN
10.0000 [IU] | Freq: Once | INTRAMUSCULAR | Status: AC
  Administered 2021-11-10: 10 [IU] via SUBCUTANEOUS

## 2021-11-10 MED ORDER — POTASSIUM CHLORIDE CRYS ER 20 MEQ PO TBCR
40.0000 meq | EXTENDED_RELEASE_TABLET | Freq: Once | ORAL | Status: AC
  Administered 2021-11-10: 40 meq via ORAL
  Filled 2021-11-10: qty 2

## 2021-11-10 MED ORDER — POTASSIUM CHLORIDE CRYS ER 20 MEQ PO TBCR
40.0000 meq | EXTENDED_RELEASE_TABLET | Freq: Two times a day (BID) | ORAL | Status: DC
  Administered 2021-11-10 (×2): 40 meq via ORAL
  Filled 2021-11-10 (×2): qty 2

## 2021-11-10 MED ORDER — INSULIN GLARGINE-YFGN 100 UNIT/ML ~~LOC~~ SOLN: 10.0000 [IU] | Freq: Every day | SUBCUTANEOUS | Status: DC

## 2021-11-10 MED ORDER — IOHEXOL 300 MG/ML  SOLN
100.0000 mL | Freq: Once | INTRAMUSCULAR | Status: AC | PRN
  Administered 2021-11-10: 100 mL via INTRAVENOUS

## 2021-11-10 MED ORDER — INSULIN ASPART 100 UNIT/ML IJ SOLN
0.0000 [IU] | INTRAMUSCULAR | Status: DC
  Administered 2021-11-10: 2 [IU] via SUBCUTANEOUS
  Administered 2021-11-10: 1 [IU] via SUBCUTANEOUS
  Administered 2021-11-11: 3 [IU] via SUBCUTANEOUS
  Administered 2021-11-11: 5 [IU] via SUBCUTANEOUS
  Administered 2021-11-11: 2 [IU] via SUBCUTANEOUS
  Administered 2021-11-11: 3 [IU] via SUBCUTANEOUS
  Administered 2021-11-11: 7 [IU] via SUBCUTANEOUS
  Administered 2021-11-12: 2 [IU] via SUBCUTANEOUS
  Administered 2021-11-12: 1 [IU] via SUBCUTANEOUS
  Administered 2021-11-12: 2 [IU] via SUBCUTANEOUS
  Administered 2021-11-12: 1 [IU] via SUBCUTANEOUS
  Administered 2021-11-12: 2 [IU] via SUBCUTANEOUS
  Administered 2021-11-12 – 2021-11-13 (×3): 1 [IU] via SUBCUTANEOUS
  Administered 2021-11-13: 3 [IU] via SUBCUTANEOUS
  Administered 2021-11-13: 1 [IU] via SUBCUTANEOUS

## 2021-11-10 NOTE — Progress Notes (Signed)
Subjective:   Summary: [name] is a 86 y.o. year old female currently admitted on the IMTS HD#1 for DKA and UTI following a fall.  Overnight Events: - Patient requesting diet, has not had p.o. intake for 3 days after her fall - Preparing to transition off insulin drip this morning after 2 consecutive closed anion gap's.   Objective:  Vital signs in last 24 hours: Vitals:   11/09/21 2348 11/10/21 0230 11/10/21 0315 11/10/21 0400  BP: (!) 105/59 140/66  122/68  Pulse: (!) 108 (!) 105  (!) 103  Resp: 14 20  20   Temp: 99.7 F (37.6 C) 99.4 F (37.4 C) 99.4 F (37.4 C) 99.5 F (37.5 C)  TempSrc:      SpO2: 96% 97%  97%  Weight:      Height:       Supplemental O2: Room Air SpO2: 97 % O2 Flow Rate (L/min): 2 L/min   Physical Exam:  Constitutional: Frail-appearing female lying in bed, in no acute distress.  Responds to questions and interactive  Cardiovascular: Tachycardic with visible pacemaker, extremity pulses 2+ Pulmonary/Chest: Increased work of breathing on room air, lungs clear to auscultation bilaterally Abdominal: soft, non-tender, non-distended.  Nontender palpable mass in suprapubic area Extremities: Pain in left shoulder which worsens with abduction.  Decreased sensation in her surface of feet bilaterally with with visible healing wound of medial right foot, no other wounds or ulcers  Filed Weights   11/09/21 1315  Weight: 58.1 kg     Intake/Output Summary (Last 24 hours) at 11/10/2021 0701 Last data filed at 11/09/2021 2122 Gross per 24 hour  Intake 3491 ml  Output --  Net 3491 ml   Net IO Since Admission: 3,491 mL [11/10/21 0701]  Pertinent Labs:    Latest Ref Rng & Units 11/09/2021    3:39 PM 11/09/2021    1:40 PM 11/09/2021    1:30 PM  CBC  WBC 4.0 - 10.5 K/uL   18.3   Hemoglobin 12.0 - 15.0 g/dL 18.0  19.0  17.3   Hematocrit 36.0 - 46.0 % 53.0  56.0  55.5   Platelets 150 - 400 K/uL   336        Latest Ref Rng & Units  11/10/2021    1:05 AM 11/09/2021    3:39 PM 11/09/2021    3:32 PM  CMP  Glucose 70 - 99 mg/dL 125  436  415   BUN 8 - 23 mg/dL 13  23  19    Creatinine 0.44 - 1.00 mg/dL 0.71  0.60  1.21   Sodium 135 - 145 mmol/L 139  134  141   Potassium 3.5 - 5.1 mmol/L 5.5  4.3  5.6   Chloride 98 - 111 mmol/L 103  101  96   CO2 22 - 32 mmol/L 24   12   Calcium 8.9 - 10.3 mg/dL 8.5   9.8   Total Protein 6.5 - 8.1 g/dL   6.6   Total Bilirubin 0.3 - 1.2 mg/dL   1.8   Alkaline Phos 38 - 126 U/L   63   AST 15 - 41 U/L   17   ALT 0 - 44 U/L   11    Beta hydroxybutyrate acid 0.68 (11/10/2021 at 0100) Recent CBGs 131, 150, 150, 143 Urine cultures pending Imaging: DG Shoulder Left  Result Date: 11/09/2021 CLINICAL DATA:  Fall  onto the arm with limited range of motion. EXAM: LEFT SHOULDER - 2+ VIEW COMPARISON:  Shoulder radiographs dated 08/06/2019. FINDINGS: There is no evidence of fracture or dislocation. There is no evidence of arthropathy or other focal bone abnormality. Soft tissues are unremarkable. A left subclavian approach cardiac device is noted. Atherosclerotic disease is noted in the aortic arch. IMPRESSION: No acute osseous injury. Aortic Atherosclerosis (ICD10-I70.0). Electronically Signed   By: Zerita Boers M.D.   On: 11/09/2021 18:25   CT Cervical Spine Wo Contrast  Result Date: 11/09/2021 CLINICAL DATA:  [place] 3 days ago, found down, bruising EXAM: CT CERVICAL SPINE WITHOUT CONTRAST TECHNIQUE: Multidetector CT imaging of the cervical spine was performed without intravenous contrast. Multiplanar CT image reconstructions were also generated. RADIATION DOSE REDUCTION: This exam was performed according to the departmental dose-optimization program which includes automated exposure control, adjustment of the mA and/or kV according to patient size and/or use of iterative reconstruction technique. COMPARISON:  06/21/2017 FINDINGS: Alignment: Slight reversal cervical lordosis centered at the C4 level likely due  to adjacent degenerative changes. Otherwise alignment is anatomic. Skull base and vertebrae: No acute fracture. No primary bone lesion or focal pathologic process. Soft tissues and spinal canal: No prevertebral fluid or swelling. No visible canal hematoma. Disc levels: Prominent spondylosis at C4-5, C5-6, and C6-7. Facet hypertrophic changes greatest at C2-3, C3-4, and C4-5. Upper chest: Airway is patent. Emphysematous changes at the lung apices. Other: Reconstructed images demonstrate no additional findings. IMPRESSION: 1. Multilevel cervical degenerative changes as above. No acute cervical spine fracture. Electronically Signed   By: Randa Ngo M.D.   On: 11/09/2021 15:02   CT HEAD WO CONTRAST (5MM)  Result Date: 11/09/2021 CLINICAL DATA:  [place] 3 days ago, found down, bruising EXAM: CT HEAD WITHOUT CONTRAST TECHNIQUE: Contiguous axial images were obtained from the base of the skull through the vertex without intravenous contrast. RADIATION DOSE REDUCTION: This exam was performed according to the departmental dose-optimization program which includes automated exposure control, adjustment of the mA and/or kV according to patient size and/or use of iterative reconstruction technique. COMPARISON:  08/09/2021 FINDINGS: Brain: No evidence of acute infarction, hemorrhage, hydrocephalus, extra-axial collection or mass lesion/mass effect. Vascular: No hyperdense vessel or unexpected calcification. Skull: Normal. Negative for fracture or focal lesion. Sinuses/Orbits: No acute finding. Other: None. IMPRESSION: 1. Stable head CT, no acute intracranial process. Electronically Signed   By: Randa Ngo M.D.   On: 11/09/2021 14:56   DG CHEST PORT 1 VIEW  Result Date: 11/09/2021 CLINICAL DATA:  Provided history: Fall, pain, history of COPD, on home oxygen. EXAM: PORTABLE CHEST 1 VIEW COMPARISON:  Prior chest radiographs 10/02/2021 and earlier. FINDINGS: Left chest dual lead implantable cardiac device. Heart size within  normal limits. Aortic atherosclerosis. No appreciable airspace consolidation or pulmonary edema. No evidence of pleural effusion or pneumothorax. No acute bony abnormality identified. Degenerative changes of the spine. Mild thoracic levocurvature. IMPRESSION: No evidence of acute cardiopulmonary abnormality. Aortic Atherosclerosis (ICD10-I70.0). Thoracic spondylosis and levocurvature. Electronically Signed   By: Kellie Simmering D.O.   On: 11/09/2021 13:56   DG Pelvis Portable  Result Date: 11/09/2021 CLINICAL DATA:  Fall 3 days ago. EXAM: PORTABLE PELVIS 1-2 VIEWS COMPARISON:  CT of 11/08/2016 FINDINGS: Femoral heads are located. No acute fracture. Sacroiliac joints are symmetric. Degenerate disc disease at multiple lower lumbar levels and lumbosacral junction. IMPRESSION: No acute osseous abnormality. Electronically Signed   By: Abigail Miyamoto M.D.   On: 11/09/2021 13:56  EKG: She is on continuous monitoring and will interpretation is as follows: Ventricular pacing similar to prior EKGs with rate slightly tachycardic at 100s to 110s.  Assessment/Plan:   Principal Problem:   DKA (diabetic ketoacidosis) ([place])   Patient Summary: [name] is a 86 y.o. female with a history of uncontrolled type 2 diabetes, COPD on home oxygen, fatty liver disease, HLD, HTN, CVA, complete heart block s/p pacemaker placement who presented after being found on the ground 3 days after a fall/loss of consciousness and admitted for DKA on hospital day 1.   #DKA secondary to uncontrolled type 2 diabetes #Hyperkalemia Patient had unwitnessed fall and was found on the ground and uncertain amount of time (likely 3 days) later.  She states that she has not eaten anything, taking any of her home diabetes medications, or had any fluid intake in that time.  Has had multiple presentations for DKA in the past with chronically uptrending A1c with most recent value is greater than 15. Anion gap closed x1 this morning, not yet  transitioned for insulin drip.  She is still hyperkalemic at 5.5. Plan 1.  Insulin drip until 2 consecutive closed anion gap on BMP and then transition to subQ insulin starting with 10 units and then 10 units Semglee daily and sliding scale insulin for meal coverage. 2.  Continue aggressive fluid resuscitation with 125 mL/h of LR and boluses as appropriate 3.  Strict I's and O's and n.p.o. until transitioned off insulin drip 4.  Continue to monitor electrolytes with BMP q4, beta hydroxybutyric acid q8   #UTI Patient found down after 3 days, uncertain urinary retention and fluid intake.  Patient reporting dysuria and mild lower abdominal pain. Initial work-up showing UA consistent with UTI as well as white blood cell count of 18.3.  Currently being treated as complicated UTI. Plan 1.  Start treatment with IV ceftriaxone 2.  Follow-up on urine cultures, blood cultures 3.  Broaden coverage and obtain blood cultures x2 in case patient is febrile or shows repeat signs of sepsis.   #AKI Patient with a fluid intake for 3 days.  Creatinine 1.21 up from baseline of 0.7-0.9 at beginning of admission but has since resolved and is 0.71 this morning due to aggressive fluid resuscitation.   Plan 1.  Continue aggressive fluid resuscitation as part of DKA protocol 2.  CTM  #Suprapubic mass This hard suprapubic mass, nontender to palpation upon exam.  This patient has surgical history of total abdominal hysterectomy, open cholecystectomy, and appendectomy.  Will obtain imaging to rule out cervical/bladder malignancy. Plan 1.  CT abdomen pelvis with contrast  #Left shoulder pain following fall Patient complained of acute left shoulder pain following a fall.  Subsequent shoulder x-ray showed no signs of soft tissue trauma or fracture.  We will treat pain conservatively. Plan 1.  Tylenol 650 mg every 6 PRN  #COPD Patient unsure how much oxygen she takes at home.  Satting 99% on 2 L nasal cannula.   Continue home regimen as needed Plan 1.  Albuterol nebs every 6 as needed and Brovana nebs twice daily plus Ellipta once daily 2.  Maintain oxygen between 88 and 92%  #Hypertension #Complete heart block status post pacemaker placement Blood pressures stabilized after initial resuscitation in the ED.  Down to 628 systolic and tachycardic in the 100s to 110.  Ventricularly paced rhythm. Plan - Start metoprolol 50 mg extended release daily.  Watch for bradycardia or sudden blood pressure drops.  Will consider  increasing dose of beta-blocker tomorrow if tachycardia can continues or worsens. - Hold home irbesartan and Lasix in the context of AKI  #HLD - Continue home pravastatin 20 mg daily  #GERD - Continue home Protonix 40 mg daily  #History of CVA in April 2023 - Continue home DAPT with aspirin and Plavix  #Anxiety - Continue home Buspar 62m TID - Hold home Xanax consider other options if patient reports sleep issues  Diet: Carb-Modified once transitioned IVF: LR,125cc/hr VTE: Enoxaparin Code: DNR PT/OT recs: Pending, walker. TOC recs: Pending Family Update:   Dispo: Anticipated discharge to  pending PT and OT evaluation   pending resolution of DKA, AKI, and UTI.   SLinus Galas MD PGY-1 Internal Medicine Resident Please contact the on call pager after 5 pm and on weekends at [phone]

## 2021-11-10 NOTE — Patient Outreach (Signed)
[facility]) Care Management Telephonic RN Care Manager Note   11/10/2021 Name:  [name] MRN:  [id number] DOB:  1936/01/18  Summary: Upon review for scheduled outreach, Pt noted to be admitted since 11/09/21 to the hospital after a 11/06/21 fall at home with prolonged time on the floor. Admission for likely rhabdo, renal failure, and DKA.  Sent message to [facility] hospital liaison related to high risk patient concerns   Subjective: [name] is an 86 y.o. year old female who is a primary patient of Wardell Honour, MD. The care management team was consulted for assistance with care management and/or care coordination needs.    Telephonic RN Care Manager completed Telephone Visit today.   Objective:  Medications Reviewed Today     Reviewed by Arman Bogus (Pharmacy Technician) on 11/10/21 at 1031  Med List Status: Complete   Medication Order Taking? Sig Documenting Provider Last Dose Status Informant  acetaminophen (TYLENOL) 325 MG tablet [id number] Yes Take 2 tablets (650 mg total) by mouth every 6 (six) hours as needed for mild pain (or Fever >/= 101). Domenic Polite, MD Past Week Active Self, Pharmacy Records  ALPRAZolam Duanne Moron) 0.5 MG tablet [id number] Yes Take 0.5 tablets (0.25 mg total) by mouth at bedtime as needed for up to 30 doses for sleep. Shelly Coss, MD Past Week Active Self, Pharmacy Records           Med Note Orlinda Blalock   Fri Oct 28, 2021  3:00 PM) Last fill 10-07-2021, 30 day supply  aspirin EC 81 MG tablet [id number] Yes Take 81 mg by mouth daily. Swallow whole. [provider] Past Week Active Self, Pharmacy Records  busPIRone (BUSPAR) 5 MG tablet [id number]  Take 5 mg by mouth 3 (three) times daily. [provider]  Active Self, Pharmacy Records           Med Note Truman Hayward, Trish Mage Nov 10, 2021 10:28 AM) Patient is unsure if she is taking this medication.  clopidogrel (PLAVIX) 75 MG tablet [id number] Yes Take 1  tablet (75 mg total) by mouth daily. Earnstine Friar, PA-C Past Week Active Self, Pharmacy Records           Med Note Truman Hayward, Evelina Bucy   Fri Oct 28, 2021  3:01 PM) Last fill 10-07-2021, 30 day supply  fluticasone (FLOVENT HFA) 110 MCG/ACT inhaler [id number] Yes Inhale 1 puff into the lungs 2 (two) times daily. [provider] unknown Active Self, Pharmacy Records           Med Note Truman Hayward, Evelina Bucy   Fri Oct 28, 2021  2:57 PM) Last fill 10-15-2021, 60 day supply  furosemide (LASIX) 20 MG tablet [id number] Yes Take 1 tablet (20 mg total) by mouth daily. Shelly Coss, MD unknown Active Self, Pharmacy Records           Med Note [facility], Legrand Como   Wed Nov 09, 2021  4:48 PM)    glucose blood (ACCU-CHEK AVIVA PLUS) test strip [id number]  Use to test blood sugar four times daily. Dx: E11.9 Wardell Honour, MD  Active Self, Pharmacy Records  insulin aspart (NOVOLOG FLEXPEN) 100 UNIT/ML FlexPen [id number] Yes INJECT 12-14 UNITS UNDER THE SKIN AT BREAKFAST, 14-16 UNITS AT LUNCH, AND 12-16 UNITS AT DINNER.  Patient taking differently: Inject 12-16 Units into the skin See admin instructions. 12-14 units at breakfast, 14-16 units at lunch, and 12-16 units at dinner  Lauree Chandler, NP Past Week Active Self, Pharmacy Records           Med Note Truman Hayward, Evelina Bucy   Fri Oct 28, 2021  3:01 PM) Last fill 10-07-2021, 24 day supply  insulin degludec (TRESIBA FLEXTOUCH) 200 UNIT/ML FlexTouch Pen [id number] Yes Inject 36 Units into the skin at bedtime. Lauree Chandler, NP Past Week Active Self, Pharmacy Records           Med Note Truman Hayward, Evelina Bucy   Fri Oct 28, 2021  2:58 PM) Last fill 10-15-2021, 16 day supply  ipratropium-albuterol (DUONEB) 0.5-2.5 (3) MG/3ML SOLN [id number] Yes 1 vial in neb every 8 hours and as needed  Patient taking differently: Take 3 mLs by nebulization every 8 (eight) hours as needed (wheezing/shortness of breath).   Deneise Lever, MD unknown Active Self, Pharmacy Records            Med Note Truman Hayward, Evelina Bucy   Fri Oct 28, 2021  3:02 PM) Last fill 10-07-2021, 10 day supply  irbesartan (AVAPRO) 75 MG tablet [id number]  Take 75 mg by mouth daily. [provider]  Active Self, Pharmacy Records           Med Note Truman Hayward, Trish Mage Nov 10, 2021 10:29 AM) Patient is unsure if she is taking this medication.  levalbuterol (XOPENEX HFA) 45 MCG/ACT inhaler [id number] Yes INHALE 1-2 PUFFS EVERY 6 HOURS IF NEEDED FOR WHEEZING.  Patient taking differently: Inhale 1-2 puffs into the lungs every 6 (six) hours as needed for wheezing.   Deneise Lever, MD unknown Active Self, Pharmacy Records           Med Note Truman Hayward, Evelina Bucy   Fri Oct 28, 2021  3:07 PM) Last fill 08-24-2021, 15 day supply  metoprolol succinate (TOPROL-XL) 50 MG 24 hr tablet [id number] Yes TAKE ONE TABLET TWICE A DAY WITH OR IMMEDIATELY FOLLOWING A MEAL. Wardell Honour, MD Past Week Active Self, Pharmacy Records  mirabegron ER [facility]) 50 MG TB24 tablet [id number] Yes Take 1 tablet (50 mg total) by mouth daily. Wardell Honour, MD unknown Active Self, Pharmacy Records           Med Note Truman Hayward, Evelina Bucy   Fri Oct 28, 2021  3:03 PM) Last fill 10-07-2021, 30 day supply  Multiple Vitamin (MULTIVITAMIN WITH MINERALS) TABS tablet [id number] Yes Take 1 tablet by mouth daily. Flora Lipps, MD Past Week Active Self, Pharmacy Records  pantoprazole (PROTONIX) 40 MG tablet [id number]  Take 1 tablet (40 mg total) by mouth daily. Flora Lipps, MD  Active Self, Pharmacy Records           Med Note Truman Hayward, Trish Mage Nov 10, 2021 10:29 AM) Patient is unsure if she is taking this medication.  pravastatin (PRAVACHOL) 20 MG tablet [id number] Yes Take 1 tablet (20 mg total) by mouth daily. Wardell Honour, MD Past Week Active Self, Pharmacy Records           Med Note Truman Hayward, Evelina Bucy   Fri Oct 28, 2021  3:04 PM) Last fill 10-07-2021, 30 day supply  thiamine 100 MG tablet [id number] Yes Take 1 tablet (100 mg total) by mouth  daily. Pokhrel, Corrie Mckusick, MD Past Week Active Self, Pharmacy Records  Tiotropium Bromide-Olodaterol (STIOLTO RESPIMAT) 2.5-2.5 MCG/ACT AERS [id number] Yes Inhale 2 puffs into the lungs daily. Deneise Lever, MD unknown Active Self, Pharmacy Records  Med Note Truman Hayward, Evelina Bucy   Fri Oct 28, 2021  3:04 PM) Last fill 10-14-2021, 30 day supply  VENTOLIN HFA 108 (90 Base) MCG/ACT inhaler [id number] Yes Inhale 2 puffs into the lungs every 6 (six) hours as needed for wheezing or shortness of breath. [provider] unknown Active Self, Pharmacy Records           Med Note Truman Hayward, Evelina Bucy   Fri Oct 28, 2021  3:00 PM) Last fill 10-07-2021, 25 day supply  Med List Note Burnett Harry, CPhT 10/04/21 7353): [facility] does not manage meds             SDOH:  (Social Determinants of Health) assessments and interventions performed:    Care Plan  Review of patient past medical history, allergies, medications, health status, including review of consultants reports, laboratory and other test data, was performed as part of comprehensive evaluation for care management services.   There are no care plans that you recently modified to display for this patient.    Plan: The patient has been provided with contact information for the care management team and has been advised to call with any health related questions or concerns.   Nason Conradt L. Lavina Hamman, RN, BSN, [place] Coordinator Office number [phone] [facility] number [phone] Fax number [phone]

## 2021-11-10 NOTE — Inpatient Diabetes Management (Signed)
Inpatient Diabetes Program Recommendations  AACE/ADA: New Consensus Statement on Inpatient Glycemic Control (2015)  Target Ranges:  Prepandial:   less than 140 mg/dL      Peak postprandial:   less than 180 mg/dL (1-2 hours)      Critically ill patients:  140 - 180 mg/dL   Lab Results  Component Value Date   GLUCAP 145 (H) 11/10/2021   HGBA1C >15.5 (H) 10/03/2021    Review of Glycemic Control  Diabetes history: DM 2 Outpatient Diabetes medications: Novolog 12-14 units breakfast, 14-16 units lunch, 12-16 units dinner, Tresiba 36 units qhs Current orders for Inpatient glycemic control:  IV insulin /Endotool  A1c >15.5 on 10/03/21 Diabetes talked with pt at that time. Pt relayed information stating her mind was not as clear since her stroke. She sees an Musician, Dr. Dwyane Dee in the past. We suggested her to possibly explore ALF to assist with meds if able.   Inpatient Diabetes Program Recommendations:    At time of transition consider: -  Semglee 15 units Q24 hours -  Novolog 0-9 units tid + hs scale  MD note: May need Novolog meal coverage when eating consistently, pt takes meal coverage at home.    Thanks,  Tama Headings RN, MSN, BC-ADM Inpatient Diabetes Coordinator Team Pager [phone] (8a-5p)

## 2021-11-11 ENCOUNTER — Other Ambulatory Visit: Payer: Self-pay | Admitting: Family Medicine

## 2021-11-11 LAB — GLUCOSE, CAPILLARY
Glucose-Capillary: 117 mg/dL — ABNORMAL HIGH (ref 70–99)
Glucose-Capillary: 154 mg/dL — ABNORMAL HIGH (ref 70–99)
Glucose-Capillary: 189 mg/dL — ABNORMAL HIGH (ref 70–99)
Glucose-Capillary: 239 mg/dL — ABNORMAL HIGH (ref 70–99)
Glucose-Capillary: 284 mg/dL — ABNORMAL HIGH (ref 70–99)
Glucose-Capillary: 342 mg/dL — ABNORMAL HIGH (ref 70–99)

## 2021-11-11 LAB — CBC
HCT: 40.1 % (ref 36.0–46.0)
Hemoglobin: 13.1 g/dL (ref 12.0–15.0)
MCH: 29.3 pg (ref 26.0–34.0)
MCHC: 32.7 g/dL (ref 30.0–36.0)
MCV: 89.7 fL (ref 80.0–100.0)
Platelets: 157 10*3/uL (ref 150–400)
RBC: 4.47 MIL/uL (ref 3.87–5.11)
RDW: 14.9 % (ref 11.5–15.5)
WBC: 8.6 10*3/uL (ref 4.0–10.5)
nRBC: 0 % (ref 0.0–0.2)

## 2021-11-11 LAB — BASIC METABOLIC PANEL
Anion gap: 6 (ref 5–15)
BUN: 6 mg/dL — ABNORMAL LOW (ref 8–23)
CO2: 28 mmol/L (ref 22–32)
Calcium: 8 mg/dL — ABNORMAL LOW (ref 8.9–10.3)
Chloride: 101 mmol/L (ref 98–111)
Creatinine, Ser: 0.57 mg/dL (ref 0.44–1.00)
GFR, Estimated: >60 mL/min (ref >60)
Glucose, Bld: 330 mg/dL — ABNORMAL HIGH (ref 70–99)
Potassium: 3.9 mmol/L (ref 3.5–5.1)
Sodium: 135 mmol/L (ref 135–145)

## 2021-11-11 MED ORDER — CALCIUM CARBONATE ANTACID 500 MG PO CHEW
1.0000 | CHEWABLE_TABLET | Freq: Three times a day (TID) | ORAL | Status: DC
  Administered 2021-11-11 – 2021-11-13 (×7): 200 mg via ORAL
  Filled 2021-11-11 (×7): qty 1

## 2021-11-11 MED ORDER — METOPROLOL SUCCINATE ER 25 MG PO TB24
50.0000 mg | ORAL_TABLET | Freq: Two times a day (BID) | ORAL | Status: DC
  Administered 2021-11-11 – 2021-11-13 (×5): 50 mg via ORAL
  Filled 2021-11-11 (×6): qty 2

## 2021-11-11 MED ORDER — AMOXICILLIN 500 MG PO CAPS
500.0000 mg | ORAL_CAPSULE | Freq: Two times a day (BID) | ORAL | Status: DC
  Administered 2021-11-11 – 2021-11-13 (×4): 500 mg via ORAL
  Filled 2021-11-11 (×6): qty 1

## 2021-11-11 MED ORDER — CHLORHEXIDINE GLUCONATE CLOTH 2 % EX PADS
6.0000 | MEDICATED_PAD | Freq: Every day | CUTANEOUS | Status: DC
  Administered 2021-11-11 – 2021-11-13 (×3): 6 via TOPICAL

## 2021-11-11 MED ORDER — INSULIN ASPART 100 UNIT/ML IJ SOLN
4.0000 [IU] | Freq: Three times a day (TID) | INTRAMUSCULAR | Status: DC
  Administered 2021-11-11 – 2021-11-12 (×5): 4 [IU] via SUBCUTANEOUS

## 2021-11-11 MED ORDER — INSULIN GLARGINE-YFGN 100 UNIT/ML ~~LOC~~ SOLN
15.0000 [IU] | Freq: Every day | SUBCUTANEOUS | Status: DC
  Administered 2021-11-11 – 2021-11-13 (×3): 15 [IU] via SUBCUTANEOUS
  Filled 2021-11-11 (×3): qty 0.15

## 2021-11-11 NOTE — Evaluation (Signed)
Physical Therapy Evaluation Patient Details Name: [name] MRN: [id number] DOB: [birth year] Today's Date: 11/11/2021  History of Present Illness  Patient is a 86 year old female with unwitenssed fall found down on the ground for likely 3 days. Found to have DKA secondary to uncontrolled type 2 diabetes, hyperkalemia, UTI, AKI, suprapubic mass. left shoulder pain with no fracture found.  Clinical Impression  Patient agreeable to PT with encouragement. She reports she lives alone and has been ambulating with a rolling walker. She reports 2 falls at home since her recent discharge from SNF.  Today, she is very fearful of falling. She requires significant assistance for basic mobility tasks. Maximal assistance required for standing x 2 bouts and she refused to attempt ambulation of getting to the chair due to fear of falling. I do not feel that the patient can manage at home alone at this time, even with home health services. SNF is recommended at this time. PT will continue to follow in attempts to maximize independence and facilitate return to prior level of function.      Recommendations for follow up therapy are one component of a multi-disciplinary discharge planning process, led by the attending physician.  Recommendations may be updated based on patient status, additional functional criteria and insurance authorization.  Follow Up Recommendations Skilled nursing-short term rehab (<3 hours/day) Can patient physically be transported by private vehicle: No    Assistance Recommended at Discharge Frequent or constant Supervision/Assistance  Patient can return home with the following  A lot of help with walking and/or transfers;A lot of help with bathing/dressing/bathroom;Help with stairs or ramp for entrance;Assist for transportation    Equipment Recommendations None recommended by PT  Recommendations for Other Services       Functional Status Assessment Patient has had a recent decline  in their functional status and demonstrates the ability to make significant improvements in function in a reasonable and predictable amount of time.     Precautions / Restrictions Precautions Precautions: Fall Restrictions Weight Bearing Restrictions: No      Mobility  Bed Mobility Overal bed mobility: Needs Assistance Bed Mobility: Sit to Supine, Supine to Sit     Supine to sit: Mod assist Sit to supine: Max assist   General bed mobility comments: assistance for LE and trunk support. cues for technique and sequencing. patient needs increased time and effort with all mobility tasks    Transfers Overall transfer level: Needs assistance Equipment used: Rolling walker (2 wheels), None Transfers: Sit to/from Stand Sit to Stand: Max assist           General transfer comment: 2 bouts of standing performed with and without rolling walker. attempted to assist patient to chair but she repeatedly states she is going to fall and is fearful of pivoting to the chair with assistance despite reassurance and encouragement    Ambulation/Gait               General Gait Details: unable to at this time due to self limited standing tolerance and poor standing balance, fear of falling while standing  Stairs            Wheelchair Mobility    Modified Rankin (Stroke Patients Only)       Balance Overall balance assessment: Needs assistance Sitting-balance support: Feet supported Sitting balance-Leahy Scale: Fair Sitting balance - Comments: close stand by assistance for safety. expressed fear of falling   Standing balance support: Bilateral upper extremity supported, Reliant on assistive device for  balance Standing balance-Leahy Scale: Poor Standing balance comment: external support required to maintain standing balance                             Pertinent Vitals/Pain Pain Assessment Pain Assessment: Faces Faces Pain Scale: Hurts little more Pain Location:  left arm Pain Descriptors / Indicators: Sore Pain Intervention(s): Limited activity within patient's tolerance, Monitored during session    Home Living Family/patient expects to be discharged to:: Private residence Living Arrangements: Alone (with pets) Available Help at Discharge: Personal care attendant (patient is vage but reports she used to have CAPS services but "it didn't work out"; no family available to help) Type of Home: Apartment         Home Layout: One level Home Equipment: Conservation officer, nature (2 wheels);Shower seat      Prior Function Prior Level of Function : History of Falls (last six months);Independent/Modified Independent             Mobility Comments: using RW for ambulation. she reports 2 falls since discharge from SNF ADLs Comments: she is vage about history, at some point CAPS services was assisting with ADLs     Hand Dominance   Dominant Hand: Right    Extremity/Trunk Assessment   Upper Extremity Assessment Upper Extremity Assessment: Generalized weakness (painful LUE at rest and with movement. AROM WFL for functional tasks assessed)    Lower Extremity Assessment Lower Extremity Assessment: Generalized weakness       Communication   Communication: No difficulties  Cognition Arousal/Alertness: Awake/alert Behavior During Therapy: WFL for tasks assessed/performed Overall Cognitive Status: Within Functional Limits for tasks assessed                                 General Comments: patient able to follow commands with increased time. she is fearful of falling. frequent cues for redirection to task as she is easily distracted and verbose        General Comments      Exercises     Assessment/Plan    PT Assessment Patient needs continued PT services  PT Problem List Decreased strength;Decreased range of motion;Decreased activity tolerance;Decreased balance;Decreased mobility;Decreased safety awareness;Decreased knowledge of  precautions       PT Treatment Interventions DME instruction;Gait training;Stair training;Functional mobility training;Therapeutic activities;Therapeutic exercise;Balance training;Neuromuscular re-education;Patient/family education    PT Goals (Current goals can be found in the Care Plan section)  Acute Rehab PT Goals Patient Stated Goal: to return home with her cat PT Goal Formulation: With patient Time For Goal Achievement: 11/18/21 Potential to Achieve Goals: Fair    Frequency Min 3X/week     Co-evaluation               AM-PAC PT "6 Clicks" Mobility  Outcome Measure Help needed turning from your back to your side while in a flat bed without using bedrails?: A Lot Help needed moving from lying on your back to sitting on the side of a flat bed without using bedrails?: A Lot Help needed moving to and from a bed to a chair (including a wheelchair)?: A Lot Help needed standing up from a chair using your arms (e.g., wheelchair or bedside chair)?: A Lot Help needed to walk in hospital room?: A Lot Help needed climbing 3-5 steps with a railing? : Total 6 Click Score: 11    End of Session   Activity  Tolerance: Patient limited by fatigue Patient left: in bed;with call bell/phone within reach;with bed alarm set Nurse Communication: Mobility status PT Visit Diagnosis: Unsteadiness on feet (R26.81);Muscle weakness (generalized) (M62.81);History of falling (Z91.81)    Time: 9584-4171 PT Time Calculation (min) (ACUTE ONLY): 48 min   Charges:   PT Evaluation $PT Eval Low Complexity: 1 Low PT Treatments $Therapeutic Activity: 8-22 mins       Minna Merritts, PT, MPT   Percell Locus 11/11/2021, 11:01 AM

## 2021-11-11 NOTE — Evaluation (Signed)
Clinical/Bedside Swallow Evaluation Patient Details  Name: [name] MRN: [id number] Date of Birth: 23-Mar-1936  Today's Date: 11/11/2021 Time: SLP Start Time (ACUTE ONLY): 1000 SLP Stop Time (ACUTE ONLY): 18 SLP Time Calculation (min) (ACUTE ONLY): 19 min  Past Medical History:  Past Medical History:  Diagnosis Date   Acute ischemic stroke ([place]) 08/10/2021   Anxiety disorder, unspecified    Arthritis    Benign paroxysmal positional vertigo 08/09/2012   Chronic rhinitis    Cognitive communication deficit    Constipation, unspecified    COPD mixed type ([place])    on home O2   Diverticulosis    Esophageal stricture    Essential (primary) hypertension    Gastro-esophageal reflux disease without esophagitis    Glaucoma    Hiatal hernia    Hyperlipidemia, unspecified    Irritable bowel syndrome    Ischemic stroke ([place]) 08/09/2021   Presence of permanent cardiac pacemaker 01/14/2020   Spinal stenosis, lumbar region, without neurogenic claudication    Steatohepatitis    Thrombocytopenia, unspecified ([place])    Type 2 diabetes mellitus with diabetic polyneuropathy ([place])    Past Surgical History:  Past Surgical History:  Procedure Laterality Date   APPENDECTOMY     CARPAL TUNNEL RELEASE     right hand   CHOLECYSTECTOMY OPEN  1978   COLONOSCOPY  07-2001   mild diverticulosis   ESOPHAGOGASTRODUODENOSCOPY  9201,00-71   H Hernia,es.stricture s/p dil 83F   INTRAOCULAR LENS INSERTION Bilateral    LIVER BIOPSY  06-1973   PACEMAKER IMPLANT N/A 01/14/2020   Procedure: PACEMAKER IMPLANT;  Surgeon: Evans Lance, MD;  Location: [facility];  Service: Cardiovascular;  Laterality: N/A;   PARATHYROID EXPLORATION     TONSILLECTOMY AND ADENOIDECTOMY     TOTAL ABDOMINAL HYSTERECTOMY     ULNAR NERVE TRANSPOSITION  12/07/2011   Procedure: ULNAR NERVE DECOMPRESSION/TRANSPOSITION;this was cancelled-not done  Surgeon: Cammie Sickle., MD;  Location: [facility];  Service:  Orthopedics;  Laterality: Right;  right ulnar nerve in situ decompression   ULNAR TUNNEL RELEASE  03/07/2012   Procedure: CUBITAL TUNNEL RELEASE;  Surgeon: Roseanne Kaufman, MD;  Location: [facility];  Service: Orthopedics;  Laterality: Right;  ulnar nerve release at the elbow     HPI:  Pt is an 86 yo female presenting with DKA and UTI after a fall with unknown downtime (suspected to be 3 days). Previous swallowing eval in September 2021 [facility] PMH includes: GERD, esophageal stricture, HH, anxiety, DMII, COPD on home O2, CVA, HTN, HLD, fatty liver disease, complete heart block s/p pacemaker    Assessment / Plan / Recommendation  Clinical Impression  Pt's oropharyngeal swallowing appears to be grossly functional with no overt signs of dysphagia or aspiration. She reports that her primary complaints are of "acuid reflux." She descibes a constant burning sensation but denies any trouble swallowing when her reflux feels better managed at home. Note that she reports drinking relatively large amounts of orange juice PTA, and unsure if she has been receiving her typcial home meds as she can't remember the name of what she takes at home for her reflux. Would continue regular solids and thin liquids as tolerated, although reviewed general esophageal precautions. Will defer any additional manamgent of suspected esophageal issues to MD. SLP Visit Diagnosis: Dysphagia, unspecified (R13.10)    Aspiration Risk  Risk for inadequate nutrition/hydration;Mild aspiration risk    Diet Recommendation Regular;Thin liquid   Liquid Administration via: Cup;Straw Medication  Administration: Whole meds with liquid Supervision: Patient able to self feed Compensations: Minimize environmental distractions;Slow rate;Small sips/bites Postural Changes: Seated upright at 90 degrees;Remain upright for at least 30 minutes after po intake    Other  Recommendations Recommended Consults: Consider GI evaluation;Consider  esophageal assessment (per MD discretion) Oral Care Recommendations: Oral care BID    Recommendations for follow up therapy are one component of a multi-disciplinary discharge planning process, led by the attending physician.  Recommendations may be updated based on patient status, additional functional criteria and insurance authorization.  Follow up Recommendations No SLP follow up      Assistance Recommended at Discharge PRN  Functional Status Assessment Patient has not had a recent decline in their functional status  Frequency and Duration            Prognosis Prognosis for Safe Diet Advancement: Good      Swallow Study   General HPI: Pt is an 86 yo female presenting with DKA and UTI after a fall with unknown downtime (suspected to be 3 days). Previous swallowing eval in September 2021 [facility]. PMH includes: GERD, esophageal stricture, HH, anxiety, DMII, COPD on home O2, CVA, HTN, HLD, fatty liver disease, complete heart block s/p pacemaker Type of Study: Bedside Swallow Evaluation Previous Swallow Assessment: see HPI Diet Prior to this Study: Regular;Thin liquids Temperature Spikes Noted: No Respiratory Status: Room air History of Recent Intubation: No Behavior/Cognition: Alert;Cooperative;Pleasant mood Oral Cavity Assessment: Within Functional Limits Oral Care Completed by SLP: No Oral Cavity - Dentition: Dentures, top;Missing dentition (no lower teeth or dentures (says she ordered them but hasn't picked them up)) Vision: Functional for self-feeding Self-Feeding Abilities: Able to feed self Patient Positioning: Upright in bed Baseline Vocal Quality: Hoarse (minimal) Volitional Cough: Weak (guarded) Volitional Swallow: Able to elicit    Oral/Motor/Sensory Function Overall Oral Motor/Sensory Function: Within functional limits   Ice Chips Ice chips: Not tested   Thin Liquid Thin Liquid: Within functional limits Presentation: Self Fed;Straw    Nectar Thick Nectar Thick  Liquid: Not tested   Honey Thick Honey Thick Liquid: Not tested   Puree Puree: Not tested   Solid     Solid: Within functional limits Presentation: Self Fed      Osie Bond., M.A. [place] Office [phone]  Secure chat preferred  11/11/2021,12:26 PM

## 2021-11-11 NOTE — Consult Note (Signed)
WOC Nurse Consult Note: Reason for Consult: foot wound, sacrum,  buttock  Patient had a fall at home, was down for 3-4 days. Surprisingly her skin is not that bad, did not appreciate and significant wounds from down time. She self reports incontinence of bowl and bladder and wearing incontinence briefs and immobility at home.  Wound type: Stage 2 Pressure Injury; coccyx; 0.3cm x 0.1cm x 0.1cm with surrounding non blanchable redness; 100% pink MASD (moisture associated skin damage) related to incontinence and brief use, immobility Neuropathic full thickness foot ulceration right medial foot; 0.2cm x 0.2cm x 0.2cm; 100% pink, with some loose slough  Healing ulceration of the right 5th toe, lateral; scabbed  Pressure Injury POA: Yes Measurement: see above  Wound bed: see above  Drainage (amount, consistency, odor) scant  Periwound: intact  Dressing procedure/placement/frequency: Single layer xeroform to the right medial foot wound, silicone foam to the sacrum. Turn and reposition frequently.  Pressure redistribution cushion for chair when up and to be taken with patient at [place] patient would benefit from SNF; patient less mobile and having skin breakdown   Discussed POC with patient and bedside nurse.  Re consult if needed, will not follow at this time. Thanks  Amos Micheals [employer], RN,CWOCN, CNS, [place] [phone])

## 2021-11-11 NOTE — Progress Notes (Addendum)
Subjective:   Summary: [name] is a 86 y.o. year old female currently admitted on the IMTS HD#2 for DKA and UTI following a fall.  Overnight Events: - Seen by diabetes educator yesterday - NAEON - Reports worsening reflux symptoms today including trouble swallowing food and regurgitation. - Denies abdominal pain, chest pain, shortness of breath, dysuria - Reports that she does not want to keep coming back to the hospital and just wants to be home with her cat  Objective:  Vital signs in last 24 hours: Vitals:   11/10/21 1628 11/10/21 2034 11/10/21 2044 11/11/21 0413  BP: 134/62 120/80  140/70  Pulse: (!) 103 (!) 109  93  Resp: 18 18  18   Temp: 98.3 F (36.8 C) 98.4 F (36.9 C)  98.3 F (36.8 C)  TempSrc: Oral Oral  Oral  SpO2: 93% 91% 92% 91%  Weight:      Height:       Supplemental O2: Room air   Physical Exam:  Constitutional: Frail-appearing female lying in bed, in no acute distress.  Responds to questions and interactive  Cardiovascular: Tachycardic with visible pacemaker, extremity pulses 2+ Pulmonary/Chest: Increased work of breathing on room air, lungs clear to auscultation bilaterally Abdominal: soft, non-tender, non-distended.  Nontender palpable mass in suprapubic area Extremities: Pain in left shoulder which worsens with abduction.  Decreased sensation in her surface of feet bilaterally with with visible healing wound of medial right foot, no other wounds or ulcers  Filed Weights   11/09/21 1315  Weight: 58.1 kg     Intake/Output Summary (Last 24 hours) at 11/11/2021 0631 Last data filed at 11/11/2021 0423 Gross per 24 hour  Intake 240 ml  Output 1550 ml  Net -1310 ml    Net IO Since Admission: 2,181 mL [11/11/21 0631]  Pertinent Labs:    Latest Ref Rng & Units 11/11/2021   12:34 AM 11/10/2021    9:49 AM 11/09/2021    3:39 PM  CBC  WBC 4.0 - 10.5 K/uL 8.6  10.9    Hemoglobin 12.0 - 15.0 g/dL 13.1  13.0  18.0   Hematocrit  36.0 - 46.0 % 40.1  41.0  53.0   Platelets 150 - 400 K/uL 157  146         Latest Ref Rng & Units 11/11/2021   12:34 AM 11/10/2021   12:45 PM 11/10/2021    9:49 AM  CMP  Glucose 70 - 99 mg/dL 330  127  190   BUN 8 - 23 mg/dL 6  9  11    Creatinine 0.44 - 1.00 mg/dL 0.57  0.67  0.70   Sodium 135 - 145 mmol/L 135  138  142   Potassium 3.5 - 5.1 mmol/L 3.9  3.4  3.7   Chloride 98 - 111 mmol/L 101  100  101   CO2 22 - 32 mmol/L 28  29  26    Calcium 8.9 - 10.3 mg/dL 8.0  8.6  9.0    Beta hydroxybutyrate acid 2.72, 0.68 Recent CBGs 284, 342, 167 Urine cultures positive for lactobacillus, susceptibility testing not available Initial Blood cultures showing no growth Imaging: CT ABDOMEN PELVIS W CONTRAST  Result Date: 11/10/2021 CLINICAL DATA:  palpable nontender suprapubic abdominal mass EXAM: CT ABDOMEN AND PELVIS WITH CONTRAST TECHNIQUE: Multidetector CT imaging of the abdomen and pelvis was performed using the standard protocol following bolus administration of intravenous  contrast. RADIATION DOSE REDUCTION: This exam was performed according to the departmental dose-optimization program which includes automated exposure control, adjustment of the mA and/or kV according to patient size and/or use of iterative reconstruction technique. CONTRAST:  122m OMNIPAQUE IOHEXOL 300 MG/ML  SOLN COMPARISON:  None Available. FINDINGS: Lower chest: No acute abnormality. Hepatobiliary: Chronic somewhat nodular liver contour with some areas of hypoattenuation. Cholecystectomy. Pancreas: Unremarkable. No pancreatic ductal dilatation or surrounding inflammatory changes. Spleen: Normal in size without focal abnormality. Adrenals/Urinary Tract: Adrenal glands are unremarkable. Kidneys are normal, without renal calculi, focal lesion, or hydronephrosis. There is circumferential bladder wall thickening with mucosal hyperenhancement. Foley catheter in place with gas in the bladder. Stomach/Bowel: Mild wall thickening and edema  of the stomach. No evidence of bowel obstruction. Vascular/Lymphatic: Aorto bi-iliac calcific atherosclerosis. No evidence of aortic aneurysm. No adenopathy. Reproductive: Status post hysterectomy. No adnexal masses. Other: No abdominal wall hernia or abnormality. Small amount gas within the right paramidline subcutaneous abdominal wall, presumably related to. No abdominopelvic ascites. Musculoskeletal: Multilevel degenerative disc disease, greatest at L3-L4, L4-L5 and L5-S1 where there is vacuum disc phenomena, disc height loss and endplate sclerosis/spurring. Multilevel facet arthropathy, severe at L5-S1. IMPRESSION: 1. Circumferential bladder wall thickening with mucosal hyperenhancement, suspicious for cystitis. Correlate with urinalysis. 2. Mild wall thickening and edema of the stomach. Correlate with signs/symptoms of gastritis. 3. Chronic somewhat nodular liver contour with some areas of hypoattenuation, potentially related to chronic liver disease/cirrhosis and/or fatty deposition. 4.  Aortic Atherosclerosis (ICD10-I70.0). Electronically Signed   By: FMargaretha SheffieldM.D.   On: 11/10/2021 13:22     EKG: She is on continuous monitoring and my interpretation is as follows:  Ventricularly paced with mild lead tachycardic rate  Assessment/Plan:   Principal Problem:   DKA (diabetic ketoacidosis) ([place] Active Problems:   Fall   Patient Summary: [name] QUALLEYis a 86y.o. female with a history of uncontrolled type 2 diabetes, COPD on home oxygen, fatty liver disease, HLD, HTN, CVA, complete heart block s/p pacemaker placement who presented after being found on the ground 3 days after a fall/loss of consciousness and admitted for DKA on hospital day 2.   #DKA secondary to uncontrolled type 2 diabetes #Hyperkalemia (resolved)  Has had multiple presentations for DKA in the past with chronically uptrending A1c with most recent value is greater than 15.  Recent CBGs high, hyperkalemia has  resolved. Plan 1.  Changing regimen to insulin 15 units long-acting, 40 units with meals plus sliding scale insulin 2.  Continue aggressive fluid resuscitation with 125 mL/h of LR and boluses as appropriate 3.  Strict I's and O's, diabetes type  4.  Continue to monitor electrolytes with BMP daily  #UTI Patient found down after 3 days, uncertain urinary retention and fluid intake.  Patient now reporting no dysuria or abdominal pain. Initial work-up showing UA consistent with UTI as well as white blood cell count of 18.3.  Currently being treated as complicated UTI.  Urine culture was positive for lactobacillus.  Pharmacy recommended transitioning to amoxicillin 500 mg twice daily to complete a 5-day course (3 more days including the 2 days of IV ceftriaxone) Plan 1.  D/C IV ceftriaxone, start 3-day course of amoxicillin 500 mg BID 2.  Broaden coverage and obtain blood cultures x2 in case patient is febrile or shows repeat signs of sepsis.  #GERD/dysphagia Patient complaining of worsening reflux and regurgitation today.  Mentions that this is gone on for at least 1 week now and that  this is preventing her from eating like normal.  Denies any vomiting. - Continue home Protonix 40 mg daily - Start Tums - SLP evaluation  #AKI (resolved) Patient with a fluid intake for 3 days.  Creatinine 1.21 up from baseline of 0.7-0.9 at beginning of admission but has since resolved and is 0.57 this morning.  No bump in creatinine from CT abdomen pelvis with contrast yesterday. Plan 1.  Continue aggressive fluid resuscitation as part of DKA protocol 2.  CTM  #Suprapubic mass This hard suprapubic mass, nontender to palpation upon exam.  This patient has surgical history of total abdominal hysterectomy, open cholecystectomy, and appendectomy.  No malignancy reported on CT abdomen pelvis with contrast  #Left shoulder pain following fall Patient complained of acute left shoulder pain following a fall.   Subsequent shoulder x-ray showed no signs of soft tissue trauma or fracture.  We will treat pain conservatively. Plan 1.  Tylenol 650 mg every 6 PRN  #COPD Patient unsure how much oxygen she takes at home.  Satting 98 to 91% on room air.  Continue home regimen as needed Plan 1.  Albuterol nebs every 6 as needed and Brovana nebs twice daily plus Ellipta once daily 2.  Maintain oxygen between 88 and 92%, patient is within this range on room air right now, restart oxygen as needed to maintain  #Hypertension #Complete heart block status post pacemaker placement Blood pressures stabilized after initial resuscitation in the ED.  Down to 553 systolic and tachycardic in the 100s to 110.  Ventricularly paced rhythm. Plan - Increase metoprolol to home dose of 50 mg twice daily.  Watch for bradycardia or decrease in blood pressures - Hold home irbesartan and Lasix with the lower blood pressures.  AKI is resolved.   #HLD - Continue home pravastatin 20 mg daily  #History of CVA in April 2023 - Continue home DAPT with aspirin and Plavix  #Anxiety - Continue home Buspar 56m TID - Hold home Xanax consider other options if patient reports sleep issues  Diet: Carb-Modified IVF: LR,125cc/hr VTE: Enoxaparin Code: DNR PT/OT recs: SNF, we will discuss with patient tomorrow and determine best plan.  SLP recommended continue normal diet TOC recs: Pending.  Patient previously checked herself out of SNF.  Palliative also consulted to provide recommendations on goals of care. Family Update:   Dispo: Anticipated discharge to SNF or home pending TOC and palliative recommendations.   SLinus Galas MD PGY-1 Internal Medicine Resident Please contact the on call pager after 5 pm and on weekends at [phone]

## 2021-11-11 NOTE — Inpatient Diabetes Management (Signed)
Inpatient Diabetes Program Recommendations  AACE/ADA: New Consensus Statement on Inpatient Glycemic Control (2015)  Target Ranges:  Prepandial:   less than 140 mg/dL      Peak postprandial:   less than 180 mg/dL (1-2 hours)      Critically ill patients:  140 - 180 mg/dL   Lab Results  Component Value Date   GLUCAP 189 (H) 11/11/2021   HGBA1C >15.5 (H) 10/03/2021    Review of Glycemic Control  Diabetes history: type 2 Outpatient Diabetes medications: Tresiba 36 units daily, Novolog 12-14 units at breakfast, 14-16 units at lunch, 12-16 units at supper Current orders for Inpatient glycemic control: Semglee 15 units daily, Novolog SENSITIVE correction scale every 4 hours, Novolog 4 units TID  Inpatient Diabetes Program Recommendations:   Spoke to patient at the bedside. States that she had fallen with her walker and was on the floor for 3 days without meds or food. A social worker happened to come by and saw that she had fallen and was able to help her. She does have help that comes in from time to time, but not consistently. She states that she cannot leave her cat. She "forgets things all the time", but states that she does take her insulin and meds. She is afraid to go anywhere else except her apartment because she wants to have her cat with her.   Will continue to follow while in the hospital.   Harvel Ricks RN BSN CDE Diabetes Coordinator Pager: [phone]  8am-5pm

## 2021-11-11 NOTE — Consult Note (Signed)
   [facility] Inpatient Consult   11/11/2021  LISET MCMONIGLE 10-Dec-1935 [id number]  [place] [facility] [ACO] Patient:  Primary Care Provider:  Wardell Honour, MD, [facility]  Patient is currently active with [place] [facility] for chronic disease management services.  Patient has been engaged by a [place].  Our community based plan of care has focused on disease management and community resource support.  Chart reviewed for ongoing care coordination needs.  Hemoglobin A1C 15.5 % noted per Diabetes Coordinator's note and [facility] RNCM and HH was working with patient on goals per encounter notes. Please see [facility] RNCM encounter notes for details on community barriers to care.  Plan: Continue to follow for needs.   Of note, [facility] Care Management services does not replace or interfere with any services that are needed or arranged by inpatient [facility] care management team.  For additional questions or referrals please contact:    Natividad Brood, RN BSN [place] [facility] Liaison  [phone] business mobile phone Toll free office [phone]  Fax number: [phone] [email or web address]

## 2021-11-12 DIAGNOSIS — W19XXXS Unspecified fall, sequela: Secondary | ICD-10-CM

## 2021-11-12 DIAGNOSIS — E875 Hyperkalemia: Secondary | ICD-10-CM | POA: Diagnosis not present

## 2021-11-12 DIAGNOSIS — Z515 Encounter for palliative care: Secondary | ICD-10-CM

## 2021-11-12 DIAGNOSIS — R739 Hyperglycemia, unspecified: Secondary | ICD-10-CM

## 2021-11-12 DIAGNOSIS — E1110 Type 2 diabetes mellitus with ketoacidosis without coma: Secondary | ICD-10-CM

## 2021-11-12 DIAGNOSIS — N3001 Acute cystitis with hematuria: Secondary | ICD-10-CM

## 2021-11-12 DIAGNOSIS — Z7189 Other specified counseling: Secondary | ICD-10-CM

## 2021-11-12 LAB — BASIC METABOLIC PANEL
Anion gap: 6 (ref 5–15)
BUN: <5 mg/dL — ABNORMAL LOW (ref 8–23)
CO2: 31 mmol/L (ref 22–32)
Calcium: 8.6 mg/dL — ABNORMAL LOW (ref 8.9–10.3)
Chloride: 98 mmol/L (ref 98–111)
Creatinine, Ser: 0.54 mg/dL (ref 0.44–1.00)
GFR, Estimated: >60 mL/min (ref >60)
Glucose, Bld: 118 mg/dL — ABNORMAL HIGH (ref 70–99)
Potassium: 3.6 mmol/L (ref 3.5–5.1)
Sodium: 135 mmol/L (ref 135–145)

## 2021-11-12 LAB — GLUCOSE, CAPILLARY
Glucose-Capillary: 129 mg/dL — ABNORMAL HIGH (ref 70–99)
Glucose-Capillary: 139 mg/dL — ABNORMAL HIGH (ref 70–99)
Glucose-Capillary: 145 mg/dL — ABNORMAL HIGH (ref 70–99)
Glucose-Capillary: 157 mg/dL — ABNORMAL HIGH (ref 70–99)
Glucose-Capillary: 162 mg/dL — ABNORMAL HIGH (ref 70–99)

## 2021-11-12 LAB — CBC
HCT: 43.1 % (ref 36.0–46.0)
Hemoglobin: 14.1 g/dL (ref 12.0–15.0)
MCH: 29.1 pg (ref 26.0–34.0)
MCHC: 32.7 g/dL (ref 30.0–36.0)
MCV: 89 fL (ref 80.0–100.0)
Platelets: 159 10*3/uL (ref 150–400)
RBC: 4.84 MIL/uL (ref 3.87–5.11)
RDW: 14.6 % (ref 11.5–15.5)
WBC: 8 10*3/uL (ref 4.0–10.5)
nRBC: 0 % (ref 0.0–0.2)

## 2021-11-12 MED ORDER — INSULIN ASPART 100 UNIT/ML IJ SOLN
5.0000 [IU] | Freq: Three times a day (TID) | INTRAMUSCULAR | Status: DC
  Administered 2021-11-12 – 2021-11-13 (×3): 5 [IU] via SUBCUTANEOUS

## 2021-11-12 MED ORDER — LIDOCAINE 5 % EX PTCH
1.0000 | MEDICATED_PATCH | CUTANEOUS | Status: DC
  Administered 2021-11-12: 1 via TRANSDERMAL
  Filled 2021-11-12: qty 1

## 2021-11-12 NOTE — TOC Initial Note (Signed)
Transition of Care [facility]) - Initial/Assessment Note    Patient Details  Name: [name] MRN: [id number] Date of Birth: 05-05-1936  [facility]) CM/SW Contact:    Elliot Gurney [place], [place] Phone Number: 11/12/2021, 4:39 PM  Clinical Narrative:                 Met with patient at bedside to discuss recommendation for SNF. Patient is ada ment that she does not want to discharge to a SNF. Patient plans to return home with [facility] services. Patient confirms being active with the CAPS program however per patient, consistency is a challenge. Patient lives alone with no family support and is considering Assisted Living. Eligibility discussed. She is agreeable to working with her [place] worker on her options for long term care. Per patient, she has a walker at home but is afraid to use it due to falls. Patient also has a med alert system that she uses often.  Transition of Care to continue to follow  [place], LCSW Transition of Care    Expected Discharge Plan: [place] Barriers to Discharge: Continued Medical Work up   Patient Goals and CMS Choice Patient states their goals for this hospitalization and ongoing recovery are:: "I am not going back to rehab"      Expected Discharge Plan and Services Expected Discharge Plan: [place] In-house Referral: Clinical Social Work   Post Acute Care Choice: [place] arrangements for the past 2 months: Apartment                                      Prior Living Arrangements/Services Living arrangements for the past 2 months: Apartment Lives with:: Self Patient language and need for interpreter reviewed:: Yes Do you feel safe going back to the place where you live?: Yes      Need for Family Participation in Patient Care: No (Comment) Care giver support system in place?: Yes (comment) (Community Alternatives Program) Current home services: Other (comment) (Per  patient, she is active with the CAP(community alternative program)) Criminal Activity/Legal Involvement Pertinent to Current Situation/Hospitalization: No - Comment as needed  Activities of Daily Living      Permission Sought/Granted                  Emotional Assessment Appearance:: Appears stated age Attitude/Demeanor/Rapport: Engaged Affect (typically observed): Agitated Orientation: : Oriented to Self, Oriented to Place, Oriented to  Time, Oriented to Situation Alcohol / Substance Use: Not Applicable Psych Involvement: No (comment)  Admission diagnosis:  Hypocalcemia [E83.51] Hyperkalemia [E87.5] DKA (diabetic ketoacidosis) ([place]) [E11.10] Hyperglycemia [R73.9] Acute cystitis with hematuria [N30.01] Fall, initial encounter [W19.XXXA] Patient Active Problem List   Diagnosis Date Noted   Fall    Stricture and stenosis of esophagus    Abnormal esophagram    Dysphagia    Protein-calorie malnutrition, severe 10/03/2021   DKA (diabetic ketoacidosis) ([place]) 10/02/2021   AKI (acute kidney injury) ([place]) 10/02/2021   Acute cystitis 10/02/2021   Diabetic foot ulcers ([place]) - right foot 10/02/2021   Sepsis with acute renal failure without septic shock ([place]) 10/02/2021   Seborrheic dermatitis 10/12/2020   Pain due to onychomycosis of toenails of both feet 09/01/2020   Sjogren's syndrome with keratoconjunctivitis sicca ([place]) 06/07/2020   Mild cognitive impairment with memory loss 01/29/2020   Full code status 01/26/2020  Pacemaker 01/19/2020   Pressure injury of skin 01/14/2020   Complete heart block (HCC)    Acute metabolic encephalopathy    Acute on chronic respiratory failure with hypoxia ([place])    Scoliosis due to degenerative disease of spine in adult patient 09/10/2018   Radicular pain of left lower extremity 09/10/2018   Hand arthritis 10/11/2017   Type 2 diabetes mellitus ([place]) 03/08/2017   Diarrhea in adult patient 11/08/2016   Thrombocytopenia ([place]) 11/08/2016    Degenerative lumbar spinal stenosis 10/27/2015   Arthralgia 09/22/2015   Chronic respiratory failure with hypoxia ([place]) 01/11/2015   Diabetic polyneuropathy associated with type 2 diabetes mellitus ([place]) 11/16/2014   Hypertension 05/19/2013   Vertigo 05/11/2013   Dizziness 05/11/2013   Hyperlipidemia 02/15/2013   Type II diabetes mellitus, uncontrolled 01/01/2013   Benign paroxysmal positional vertigo 08/09/2012   Type II or unspecified type diabetes mellitus with neurological manifestations, uncontrolled(250.62) 08/09/2012   GLAUCOMA 03/16/2010   HOARSENESS 03/16/2010   COUGH 12/23/2009   MUSCULOSKELETAL PAIN 01/28/2009   COPD mixed type ([place]) 01/15/2009   Seasonal and perennial allergic rhinitis 11/23/2007   CHRONIC RHINITIS 07/16/2007   GERD (gastroesophageal reflux disease) 05/29/2007   I B S-DIARRHEAL PREDOMINATE 05/29/2007   FATTY LIVER DISEASE 05/29/2007   PCP:  Wardell Honour, MD Pharmacy:   [facility], [place] [address] Phone: [phone] Fax: [place] [phone] - [place], [place] [address] [place] [address] Phone: [phone] Fax: [place], [place] - [place] [address] Melina Modena [address] [place]derick [place] [address] Phone: [phone] Fax: [phone]     Social Determinants of Health (SDOH) Interventions    Readmission Risk Interventions     No data to display

## 2021-11-12 NOTE — Evaluation (Signed)
Occupational Therapy Evaluation Patient Details Name: [name] MRN: [id number] DOB: 12/12/1935 Today's Date: 11/12/2021   History of Present Illness Patient is a 86 year old female with unwitenssed fall found down on the ground for likely 3 days. Found to have DKA secondary to uncontrolled type 2 diabetes, hyperkalemia, UTI, AKI, suprapubic mass. left shoulder pain with no fracture found.   Clinical Impression   Pt was able to complete bed mobility with mod assist from supine to sitting and required mod to max assist when first sitting at EOB due to posterior tilt. Pt was able to then complete sit to stand transfer with RW with mod assistance. [name] was able to take 3-5 steps to chair but then reported "this is what happened when I fell" but did not explain further and required to go into sitting with min guard to chair. Pt at this time needed set up with all feeeding materials/opening materials due to pain in LUE. Pt currently with functional limitations due to the deficits listed below (see OT Problem List).  Pt will benefit from skilled OT to increase their safety and independence with ADL and functional mobility for ADL to facilitate discharge to venue listed below.        Recommendations for follow up therapy are one component of a multi-disciplinary discharge planning process, led by the attending physician.  Recommendations may be updated based on patient status, additional functional criteria and insurance authorization.   Follow Up Recommendations  Skilled nursing-short term rehab (<3 hours/day)    Assistance Recommended at Discharge Frequent or constant Supervision/Assistance  Patient can return home with the following A lot of help with walking and/or transfers;A lot of help with bathing/dressing/bathroom    Functional Status Assessment  Patient has had a recent decline in their functional status and demonstrates the ability to make significant improvements in function  in a reasonable and predictable amount of time.  Equipment Recommendations   (TBD at next level of care)    Recommendations for Other Services       Precautions / Restrictions Precautions Precautions: Fall Restrictions Weight Bearing Restrictions: No      Mobility Bed Mobility Overal bed mobility: Needs Assistance Bed Mobility: Supine to Sit     Supine to sit: Mod assist, HOB elevated     General bed mobility comments: Pt needed max cues on position    Transfers Overall transfer level: Needs assistance Equipment used: Rolling walker (2 wheels), None Transfers: Sit to/from Stand Sit to Stand: Mod assist, From elevated surface                  Balance Overall balance assessment: Needs assistance Sitting-balance support: Feet supported Sitting balance-Leahy Scale: Fair Sitting balance - Comments: Pt had a posterior tilt and needed min to mod assist to stablize Postural control: Posterior lean Standing balance support: Bilateral upper extremity supported Standing balance-Leahy Scale: Poor                             ADL either performed or assessed with clinical judgement   ADL Overall ADL's : Needs assistance/impaired Eating/Feeding: Set up;Sitting   Grooming: Wash/dry hands;Wash/dry face;Minimal assistance;Sitting   Upper Body Bathing: Minimal assistance;Sitting   Lower Body Bathing: Maximal assistance;Total assistance;Sit to/from stand   Upper Body Dressing : Minimal assistance;Sitting   Lower Body Dressing: Maximal assistance;Total assistance;Sit to/from stand   Toilet Transfer: Minimal assistance;Cueing for safety;Cueing for sequencing;Rolling walker (2 wheels);BSC/3in1  Toileting- Clothing Manipulation and Hygiene: Total assistance;Sit to/from stand       Functional mobility during ADLs: Minimal assistance;Cueing for safety;Cueing for sequencing;Rolling walker (2 wheels)       Vision         Perception     Praxis       Pertinent Vitals/Pain Pain Assessment Pain Assessment: Faces Faces Pain Scale: Hurts even more Pain Location: left arm Pain Descriptors / Indicators: Sore Pain Intervention(s): Limited activity within patient's tolerance, Monitored during session, Repositioned     Hand Dominance Right   Extremity/Trunk Assessment Upper Extremity Assessment Upper Extremity Assessment: LUE deficits/detail LUE Deficits / Details: Pt reported pain in LUE LUE: Unable to fully assess due to pain LUE Sensation: WNL LUE Coordination: decreased gross motor;decreased fine motor   Lower Extremity Assessment Lower Extremity Assessment: Defer to PT evaluation       Communication Communication Communication: No difficulties   Cognition Arousal/Alertness: Awake/alert Behavior During Therapy: WFL for tasks assessed/performed Overall Cognitive Status: Within Functional Limits for tasks assessed                                 General Comments: Pt at this moring that they could not recall where they were but during this session was corecly able to report     General Comments       Exercises     Shoulder Instructions      Home Living Family/patient expects to be discharged to:: Private residence Living Arrangements: Alone Available Help at Discharge: Personal care attendant Type of Home: Apartment Home Access: Stairs to enter     Home Layout: One level     Bathroom Shower/Tub: Teacher, early years/pre: Standard Bathroom Accessibility: No   Home Equipment: Conservation officer, nature (2 wheels);Shower seat   Additional Comments: reported use of o2 at home but in 90%      Prior Functioning/Environment Prior Level of Function : History of Falls (last six months);Independent/Modified Independent             Mobility Comments: using RW for ambulation. she reports 2 falls since discharge from SNF ADLs Comments: she is vage about history, at some point [facility] services was  assisting with ADLs        OT Problem List: Decreased strength;Decreased range of motion;Decreased activity tolerance;Impaired balance (sitting and/or standing);Decreased safety awareness;Decreased knowledge of use of DME or AE;Cardiopulmonary status limiting activity;Pain      OT Treatment/Interventions: Self-care/ADL training;Therapeutic activities;Patient/family education;Balance training    OT Goals(Current goals can be found in the care plan section) Acute Rehab OT Goals Patient Stated Goal: to be able to move more OT Goal Formulation: With patient Time For Goal Achievement: 11/26/21 Potential to Achieve Goals: Good  OT Frequency: Min 2X/week    Co-evaluation              AM-PAC OT "6 Clicks" Daily Activity     Outcome Measure Help from another person eating meals?: A Little Help from another person taking care of personal grooming?: A Lot Help from another person toileting, which includes using toliet, bedpan, or urinal?: Total Help from another person bathing (including washing, rinsing, drying)?: A Little Help from another person to put on and taking off regular upper body clothing?: A Lot Help from another person to put on and taking off regular lower body clothing?: A Lot 6 Click Score: 13   End of Session Equipment  Utilized During Treatment: Rolling walker (2 wheels);Gait belt Nurse Communication: Mobility status  Activity Tolerance: Patient limited by fatigue Patient left: with call bell/phone within reach;with chair alarm set;in chair  OT Visit Diagnosis: Unsteadiness on feet (R26.81);Other abnormalities of gait and mobility (R26.89);Repeated falls (R29.6);Muscle weakness (generalized) (M62.81);Pain Pain - Right/Left: Left Pain - part of body: Shoulder                Time: 2666-6486 OT Time Calculation (min): 31 min Charges:  OT General Charges $OT Visit: 1 Visit OT Evaluation $OT Eval Low Complexity: 1 Low OT Treatments $Self Care/Home Management :  8-22 mins  Joeseph Amor OTR/L  Acute Rehab Services  [phone] office number [phone] pager number   Joeseph Amor 11/12/2021, 11:21 AM

## 2021-11-12 NOTE — NC FL2 (Signed)
[place] MEDICAID FL2 LEVEL OF CARE SCREENING TOOL     IDENTIFICATION  Patient Name: [name] Birthdate: 1936-03-13 Sex: female Admission Date (Current Location): 11/09/2021  [facility] and [place] Number:  Herbalist and Address:  [facility] [place], [place] [address] [place], [place] [address]      Provider Number: [id number]  Attending Physician Name and Address:  Sid Falcon, MD  Relative Name and Phone Number:       Current Level of Care: Hospital Recommended Level of Care: [place] Prior Approval Number:    Date Approved/Denied:   PASRR Number: [id number]  Discharge Plan: SNF    Current Diagnoses: Patient Active Problem List   Diagnosis Date Noted   Fall    Stricture and stenosis of esophagus    Abnormal esophagram    Dysphagia    Protein-calorie malnutrition, severe 10/03/2021   DKA (diabetic ketoacidosis) ([place]) 10/02/2021   AKI (acute kidney injury) ([place]) 10/02/2021   Acute cystitis 10/02/2021   Diabetic foot ulcers ([place]) - right foot 10/02/2021   Sepsis with acute renal failure without septic shock ([place]) 10/02/2021   Seborrheic dermatitis 10/12/2020   Pain due to onychomycosis of toenails of both feet 09/01/2020   Sjogren's syndrome with keratoconjunctivitis sicca ([place]) 06/07/2020   Mild cognitive impairment with memory loss 01/29/2020   Full code status 01/26/2020   Pacemaker 01/19/2020   Pressure injury of skin 01/14/2020   Complete heart block (HCC)    Acute metabolic encephalopathy    Acute on chronic respiratory failure with hypoxia ([place])    Scoliosis due to degenerative disease of spine in adult patient 09/10/2018   Radicular pain of left lower extremity 09/10/2018   Hand arthritis 10/11/2017   Type 2 diabetes mellitus ([place]) 03/08/2017   Diarrhea in adult patient 11/08/2016   Thrombocytopenia ([place]) 11/08/2016   Degenerative lumbar spinal stenosis 10/27/2015   Arthralgia 09/22/2015   Chronic  respiratory failure with hypoxia ([place]) 01/11/2015   Diabetic polyneuropathy associated with type 2 diabetes mellitus ([place]) 11/16/2014   Hypertension 05/19/2013   Vertigo 05/11/2013   Dizziness 05/11/2013   Hyperlipidemia 02/15/2013   Type II diabetes mellitus, uncontrolled 01/01/2013   Benign paroxysmal positional vertigo 08/09/2012   Type II or unspecified type diabetes mellitus with neurological manifestations, uncontrolled(250.62) 08/09/2012   GLAUCOMA 03/16/2010   HOARSENESS 03/16/2010   COUGH 12/23/2009   MUSCULOSKELETAL PAIN 01/28/2009   COPD mixed type ([place]) 01/15/2009   Seasonal and perennial allergic rhinitis 11/23/2007   CHRONIC RHINITIS 07/16/2007   GERD (gastroesophageal reflux disease) 05/29/2007   I B S-DIARRHEAL PREDOMINATE 05/29/2007   FATTY LIVER DISEASE 05/29/2007    Orientation RESPIRATION BLADDER Height & Weight     Self, Place  Normal External catheter Weight: 128 lb (58.1 kg) Height:  5' 3"  (160 cm)  BEHAVIORAL SYMPTOMS/MOOD NEUROLOGICAL BOWEL NUTRITION STATUS      Continent Diet (See discharge summary.)  AMBULATORY STATUS COMMUNICATION OF NEEDS Skin   Extensive Assist Verbally PU Stage and Appropriate Care   PU Stage 2 Dressing: Daily                   Personal Care Assistance Level of Assistance  Bathing, Feeding, Dressing Bathing Assistance: Maximum assistance Feeding assistance: Maximum assistance Dressing Assistance: Maximum assistance     Functional Limitations Info  Sight, Hearing Sight Info: Impaired Hearing Info: Adequate      SPECIAL CARE FACTORS FREQUENCY  PT (By licensed PT), OT (By licensed  OT)     PT Frequency: 5x/week OT Frequency: 5x/week            Contractures Contractures Info: Not present    Additional Factors Info  Insulin Sliding Scale Code Status Info: DNR Allergies Info: Chlordiazepoxide-clidinium, Biaxin, Tramadol, Codeine   Insulin Sliding Scale Info: insulin aspart (novoLOG) injection 0-5 Units daily  at bedtime,insulin aspart (novoLOG) injection 0-9 Units 3 times daily with meals,insulin glargine-yfgn [facility]) injection 30 Units daily       Current Medications (11/12/2021):  This is the current hospital active medication list Current Facility-Administered Medications  Medication Dose Route Frequency Provider Last Rate Last Admin   acetaminophen (TYLENOL) tablet 650 mg  650 mg Oral Q6H PRN Linus Galas, MD   650 mg at 11/12/21 1017   albuterol (PROVENTIL) (2.5 MG/3ML) 0.083% nebulizer solution 2.5 mg  2.5 mg Nebulization Q6H PRN Linus Galas, MD       amoxicillin (AMOXIL) capsule 500 mg  500 mg Oral Q12H Jodell Cipro, Sriramkumar, MD   500 mg at 11/12/21 0914   arformoterol (BROVANA) nebulizer solution 15 mcg  15 mcg Nebulization BID Linus Galas, MD   15 mcg at 11/12/21 3762   And   umeclidinium bromide (INCRUSE ELLIPTA) 62.5 MCG/ACT 1 puff  1 puff Inhalation Daily Linus Galas, MD   1 puff at 11/12/21 0816   aspirin EC tablet 81 mg  81 mg Oral Daily Linus Galas, MD   81 mg at 11/12/21 0914   busPIRone (BUSPAR) tablet 5 mg  5 mg Oral TID Linus Galas, MD   5 mg at 11/12/21 0914   calcium carbonate (TUMS - dosed in mg elemental calcium) chewable tablet 200 mg of elemental calcium  1 tablet Oral TID WC Lacinda Axon, MD   200 mg of elemental calcium at 11/12/21 0916   Chlorhexidine Gluconate Cloth 2 % PADS 6 each  6 each Topical Daily Sid Falcon, MD   6 each at 11/12/21 0917   clopidogrel (PLAVIX) tablet 75 mg  75 mg Oral Daily Linus Galas, MD   75 mg at 11/12/21 0914   dextrose 50 % solution 0-50 mL  0-50 mL Intravenous PRN Lacinda Axon, MD       enoxaparin (LOVENOX) injection 40 mg  40 mg Subcutaneous Q24H Lacinda Axon, MD   40 mg at 11/11/21 2113   insulin aspart (novoLOG) injection 0-9 Units  0-9 Units Subcutaneous Q4H Sanjuan Dame, MD   2 Units at 11/12/21 0915   insulin aspart (novoLOG)  injection 4 Units  4 Units Subcutaneous TID WC Linus Galas, MD   4 Units at 11/12/21 0915   insulin glargine-yfgn (SEMGLEE) injection 15 Units  15 Units Subcutaneous Daily Linus Galas, MD   15 Units at 11/12/21 0914   metoprolol succinate (TOPROL-XL) 24 hr tablet 50 mg  50 mg Oral BID Linus Galas, MD   50 mg at 11/12/21 0914   pantoprazole (PROTONIX) EC tablet 40 mg  40 mg Oral Daily Linus Galas, MD   40 mg at 11/12/21 0914   pravastatin (PRAVACHOL) tablet 20 mg  20 mg Oral Daily Linus Galas, MD   20 mg at 11/12/21 8315     Discharge Medications: Please see discharge summary for a list of discharge medications.  Relevant Imaging Results:  Relevant Lab Results:   Additional Information SSN: [id number], Both COVID 1 boosters  Barton Fanny, Student-Social Work

## 2021-11-12 NOTE — Consult Note (Signed)
Palliative Medicine Inpatient Consult Note  Reason for consult:  Goals of Care  HPI:  Per intake H&P - By Dr. Jodell Cipro on 11/09/2021-> History of Present Illness: "[name] is an 86 year old female with past medical history of uncontrolled type 2 diabetes, COPD on home oxygen, fatty liver disease, HLD, HTN, complete heart block s/p pacemaker who presents to the ED following 3 days down after a fall.  She states that she was at home when her walker "flew up and hit her in the head" and she lost consciousness.  She later read and was down on the floor for 3 days.  She was unable to contact emergency services using her phone.  She endorses some confusion, area, and acute left shoulder pain, but denies chest pain, cough, shortness of breath, abdominal pain nausea vomiting or diarrhea.  She mentions she has not eaten or had fluid intake or taking any of her medications in this period of time when she was down.  Upon questioning she stated that she would like to be DNR CODE STATUS.  Of note she was recently admitted for DKA between May 28 and June 2 and also presented to the ED on June 23 with chief complaint of hypoglycemia.  She is uncertain about which her medications she takes at home, and is uncertain about her home home diabetes regimen.  She says she is follows up with Dr. Sabra Heck her PCP to manage this regimen.   ED Course: She was brought to the ED after being found on the ground today.  Upon initial assessment blood pressure was 173/70, pulse 121, O2 sat 98% on 2 L nasal cannula, initial temperature 96 Fahrenheit, respiratory rate 25.  Initial physical exam No obvious signs of trauma.  Urgent work-up is as follows: White blood count 54.6 with neutrophilic predominance, hemoglobin 17.3, lactic acid 3.5, beta hydroxybutyrate greater than 8, bicarb 17.4 and pH 7.204 on VBG, CBG 45. UA showing urine glucose above upper limit, positive ketones, and positive leukocytes, white blood cells, and  bacteria.  CK 187.  Initial sodium and potassium hemolyzed but follow-up CMP showing potassium 5.6, sodium 141, creatinine 1.21, with anion gap of 33.  Serum osmolality 331.  Initial EKG shows ventricular pacing consistent with prior EKGs and initial troponins were negative. Initial imaging including chest x-ray pelvic x-ray CT head without contrast and CT cervical spine without contrast unrevealing for signs of acute trauma.  She was given 3 L of fluid bolus and started on fluid and fluid infusion, given calcium gluconate, started on ceftriaxone for UTI, and given 10 units of insulin as well as 50 mEq sodium bicarb."   Clinical Assessment/Goals of Care: I have reviewed medical records including EPIC notes, labs and imaging, received report from bedside RN Delisha, and assessed the patient.    I met with Noel Gerold to further discuss diagnosis prognosis, GOC, EOL wishes, disposition and options.   I introduced Palliative Medicine as specialized medical care for people living with serious illness. It focuses on providing relief from the symptoms and stress of a serious illness. The goal is to improve quality of life for both the patient and the family.  [name] is divorced for 32 years. 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  She has a girlfriend Alphonzo Severance who lives in [place] [phone]. She has no family and no friends local. She states she has nosy neighbors and her ex-husband who call her frequently.  She says  she was called by a Psychologist, sport and exercise since admission as a neighbor reported she had no food in her house. She says she has money to buy food.  She has a Runner, broadcasting/film/video alert button but was not wearing it when she fell.  She sold her home and lives in a 1 BR apartment. She has thought about transitioning to an independent living or AL home but she has a cat that she is not willing to give up. I shared that the case manger could share information on possible  living arrangements. At home she walks with a walker. She uses 1.5 up to 3.0 liters of oxygen by nasal cannula. She had an aide through [facility] from 9-3 five days a week M-F but since her most recent discharge they had been unable to send anyone.   A detailed discussion was had today regarding advanced directives.  Concepts specific to code status, artifical feeding and hydration, continued IV antibiotics and rehospitalization was had.  The difference between a aggressive medical intervention path  and a palliative comfort care path for this patient at this time was had. Values and goals of care important to patient and family were attempted to be elicited.   She confirmed DNR status and that she would not want a ventilator, feeding tube or dialysis even if indicated by physicians. She has an implanted cardiac pacemaker. She is "ready to die when that time comes." She does not want to go to rehab as she states physical therapy increased her back pain and problems previously. She wants to go home with home health support once cleared for discharge and to explore other living arrangements where she can bring her cat.  She shared that the few days she was laying in the floor that the cat snuggled up to her and brought her comfort. She says she has no family or friends who would take the cat. We discussed the importance of therapies to help with her physical debility.   Discussed the importance of continued conversation with family and their  medical providers regarding overall plan of care and treatment options, ensuring decisions are within the context of the patients values and GOCs.  Decision Maker:Patient  SUMMARY OF RECOMMENDATIONS    Code Status/Advance Care Planning:  DNAR/DNI   She has no written advance directives and does not want to complete any currently.  Symptom Management:  Chronic pain- acetaminophen, lidocaine patch Breathlessness- aformoterol and umeclidinium inhalers daily, albuterol  inhaler as needed Physical debility- encourage OT, PT Acid reflux- pantoprazole daily   Additional Recommendations (Limitations, Scope, Preferences): No ventilator, no feeding tube or dialysis even if medically indicated.   Psycho-social/Spiritual:  Desire for further Chaplaincy support: No Additional Recommendations: Palliative to follow   Discharge Planning: Home with home health vs. SNF TBD  Review of Systems  Constitutional: Negative.   HENT: Negative.    Eyes: Negative.   Respiratory:  Positive for shortness of breath.   Cardiovascular: Negative.   Gastrointestinal:        Acid reflux  Genitourinary: Negative.   Musculoskeletal:  Positive for joint pain.       Both arms, left worse than right  Skin: Negative.   Neurological:  Positive for weakness.  Endo/Heme/Allergies: Negative.   Psychiatric/Behavioral:  Negative for depression. The patient is not nervous/anxious.        11/12/2021    7:35 AM 11/12/2021    5:06 AM 11/11/2021    8:18 PM  Vitals with BMI  Systolic 655  391 225  Diastolic 49 87 62  Pulse 90 83 79    Physical Exam  PPS: 60%   This conversation/these recommendations were discussed with patient primary care team, Dr. Daryll Drown via secure chat.  Thank you for the opportunity to participate in the care of this patient and family.    Total Time: 100 minutes Greater than 50%  of this time was spent counseling and coordinating care related to the above assessment and plan.  Lindell Spar, NP [facility] Palliative Medicine Team Team Cell Phone: [phone] Please utilize secure chat with additional questions, if there is no response within 30 minutes please call the above phone number  Palliative Medicine Team providers are available by phone from 7am to 7pm daily and can be reached through the team cell phone.  Should this patient require assistance outside of these hours, please call the patient's attending physician.

## 2021-11-12 NOTE — Hospital Course (Signed)
[name] is an 86 year old female with past medical history of uncontrolled type 2 diabetes, COPD on home oxygen, fatty liver disease, HLD, HTN, and complete heart block s/p pacemaker who presented to the ED after being found approximately 3 days down after a fall and was admitted for DKA.  ED course: The patient was brought to the ED after being found on the ground after unclear duration of time. Patient presented hypertensive with no obvious signs of trauma.  Urgent work-up showed WBC 16.1 with neutrophilic predominance, hemoglobin 17.3, lactic acid 3.5, beta hydroxybutyrate greater than 8, bicarb 17.4 and pH 7.204 on VBG with CBG of 45. UA showing urine glucose above upper limit, positive ketones, and positive leukocytes, white blood cells, and bacteria.  CMP showed potassium 5.6, sodium 141, creatinine 1.21, with anion gap of 33. Initial EKG shows ventricular pacing consistent with prior EKGs and initial troponins were negative. CXR, pelvic x-ray, CT head without contrast, and CT cervical spine without contrast did not show and signs of acute trauma.  DKA secondary to uncontrolled type 2 diabetes Hyperkalemia Likely 3 days following a fall, the patient presented with labs indicating acute acidosis with ketones in urine and hyperglycemia consistent with DKA. She reported no food, fluid, or medicine intake after her fall. Her A1c was greater than 15 on most recent measurement on 10/03/21 and she has had repeat hospitalizations for DKA. Aggressive fluid resuscitation, insulin and calcium gluconate were started in the ED for DKA management. Patient's hyperkalemia, acidosis, urine ketones, and hyperglycemia resolved with this treatment. Patient was placed on 15 units long acting insulin daily and 5 units short acting with meals and SSI. She was discharged on ***.   UTI Patient reported dysuria and mild lower abdominal pain. Initial UA was consistent with UTI with WBC of 18.3. Treated as complicated UTI  with IV ceftriaxone. Urine cultures and blood cultures were obtained. Urine culture was positive for lactobacillus and pharmacy recommended transitioning to amoxicillin 500 mg twice daily for 3 days following 2 days of IV ceftriaxone for a total of 5 day treatment course. Patient's leukocytosis resolved and she remained afebrile for >48*** hours at time of discharge.   AKI Patient presented with creatinine at 1.21, up from a baseline of 0.7-0.9 with BUN over creatinine ratio of less than 20. Severe dehydration and hypovolemia in the context of DKA versus ATN was suspected. Following aggressive fluid resuscitation, creatinine fell to 0.5 and AKI resolved.  Left shoulder pain following fall Patient complained of acute left shoulder pain following her fall. Shoulder X-ray showed no signs of soft tissue trauma or fracture. Pain was treated with Tylenol 650 mg every 6 PRN and home health PT was established on discharge***.  GERD/dysphagia Patient complained of reflux and regurgitation for at least 1 week during this admission. She was continued on home Protonix 40 mg daily and started on Tums. She was evaluation by speech/language pathology due to complaints of regurgitation and difficulty eating. SLP recommended ***.  COPD The patient was placed on 2L O2 on admission (goal O2 saturation 88%-92%) due to unclear home oxygen requirement and started on albuterol nebs every 6 hours as needed as well as Brovana nebs twice daily plus Ellipta once daily. Oxygen weaned to room air gradually and patient continued saturating above 90%.  Hypertension Complete heart block status post pacemaker placement Patient's initial hypertension stabilized in the ED with fluids and she entered ventricularly paced rhythm with tachycardia to the 100s. Metoprolol 50 mg extended release daily  was started and home irbesartan and Lasix were held in the setting of AKI.  Suprapubic mass Hard nontender to palpation suprapubic mass was  found incidentally on exam. CT abdomen pelvis with contrast was obtained and showed circumferential bladder wall thickening with mucosal hyperenhancement, suspicious for cystitis. Other findings included chronic somewhat nodular liver contour potentially related to cirrhosis, mild wall thickening and edema of stomach, and aortic atherosclerosis.  HLD Patient continue on home pravastatin 20 mg daily during this admission.  History of CVA in April 2023 Patient continued on home DAPT with aspirin and Plavix during this admission.  Anxiety Patient continued on home Buspar 22m TID during this admission. Held home Xanax due to patient reports of sleep issues.

## 2021-11-12 NOTE — Progress Notes (Signed)
Subjective:   Hospital day: 4  Overnight event: No acute events overnight  Interim History: Patient was evaluated at the bedside laying comfortably in bed. She denies any shortness of breath or cough but continues to endorse some pain in her left shoulder. Patient reports she worked with a therapist earlier this morning. States she would like some assistance with the cost of living in an assisted living facility but would not go to SNF. Patient reports that she wants to go home to her And she has a neighbor that comes and checks in on her. She reports at her last SNF placement, it was too aggressive for her with the sessions to the point that she could barely walk when she got home.  She is agreeable to going home with home PT/OT.  Objective:  Vital signs in last 24 hours: Vitals:   11/11/21 1546 11/11/21 2018 11/11/21 2027 11/12/21 0506  BP: 140/85 121/62  (!) 182/87  Pulse: 81 79  83  Resp: 16 17    Temp: (!) 97.5 F (36.4 C) 98.4 F (36.9 C)  98 F (36.7 C)  TempSrc: Oral Oral  Oral  SpO2: 95% 93% 95% 95%  Weight:      Height:        Filed Weights   11/09/21 1315  Weight: 58.1 kg     Intake/Output Summary (Last 24 hours) at 11/12/2021 0628 Last data filed at 11/12/2021 0457 Gross per 24 hour  Intake 360 ml  Output 2000 ml  Net -1640 ml   Net IO Since Admission: 161 mL [11/12/21 0628]  Recent Labs    11/11/21 1935 11/12/21 0018 11/12/21 0454  GLUCAP 117* 145* 139*     Pertinent Labs:    Latest Ref Rng & Units 11/12/2021   12:57 AM 11/11/2021   12:34 AM 11/10/2021    9:49 AM  CBC  WBC 4.0 - 10.5 K/uL 8.0  8.6  10.9   Hemoglobin 12.0 - 15.0 g/dL 14.1  13.1  13.0   Hematocrit 36.0 - 46.0 % 43.1  40.1  41.0   Platelets 150 - 400 K/uL 159  157  146        Latest Ref Rng & Units 11/12/2021   12:57 AM 11/11/2021   12:34 AM 11/10/2021   12:45 PM  CMP  Glucose 70 - 99 mg/dL 118  330  127   BUN 8 - 23 mg/dL <5  6  9    Creatinine 0.44 - 1.00 mg/dL 0.54  0.57  0.67    Sodium 135 - 145 mmol/L 135  135  138   Potassium 3.5 - 5.1 mmol/L 3.6  3.9  3.4   Chloride 98 - 111 mmol/L 98  101  100   CO2 22 - 32 mmol/L 31  28  29    Calcium 8.9 - 10.3 mg/dL 8.6  8.0  8.6     Imaging: No results found.  Physical Exam  General: Pleasant, frail elderly woman laying in bed. No acute distress. CV: RRR. No m/r/g. No LE edema Pulmonary: Lungs CTAB. Normal effort. No wheezing or rales. Abdominal: Soft, nontender, nondistended. Normal bowel sounds. Extremities: 2+ distal pulses. Normal ROM. Skin: Warm and dry. No obvious rash or lesions. MSK: Limited ROM of the left shoulder, especially with abduction. Mild ttp of the left deltoid muscle. Neuro: A&Ox3. Moves all extremities. Normal sensation to gross touch.  Psych: Normal mood and affect   Assessment/Plan: [name] is a 86 y.o. female with  hx of uncontrolled type 2 diabetes, COPD on home oxygen, fatty liver disease, HLD, HTN, CVA, complete heart block s/p pacemaker placement who presented after being found on the ground 3 days after a fall/loss of consciousness and admitted for DKA on hospital day 4.   Principal Problem:   DKA (diabetic ketoacidosis) ([place]) Active Problems:   Fall  #DKA (resolved) #Uncontrolled type II DM, A1c >15.5 in 10/03/21 Blood sugars have improved after increasing Semglee to 15 units but still remain above goal.  CBGs in the 110s to 160s in the past 24 hours. Fasting blood sugar of 139 this morning. -Continue Semglee 15 units daily -Increase NovoLog to 5 units 3 times daily with meals -SSI w/ meals -Pending discharge home with home health PT/OT  #UTI Urine culture was positive for lactobacillus. Urinary symptoms have resolved.  Antibiotics regimen has been de-escalated to amoxicillin from IV Rocephin.  Patient remained afebrile with no leukocytosis in the past 48 hours. -Continue amoxicillin 500 mg twice daily x2 days (day 4/5 of abx) -Trend WBC, fever  curve  #GERD/dysphagia Patient reports her acid reflux has improved slightly.  She has been taking the Tums which have been helping. She was evaluated by SLP yesterday and they recommended a regular diet. -Continue home Protonix 40 mg daily -Continue Tums -Continue regular diet, carb modified   #AKI (resolved) BMP today shows stable kidney function and no electrolyte abnormalities. -Daily BMP  Goals of Care Patient reports that she understands she is chronically ill. She has had multiple recent hospitalizations for DKA. States she is ready to die when the time comes but does not want to die in front of her cat at home.  States she has no family around but has a neighbor that occasionally comes to check on her.  She changed her CODE STATUS to DNR during this admission. -Palliative care team consulted, appreciate assistance -Continue DNR   #Left shoulder pain s/p fall: No fracture on imaging.  Continue as needed Tylenol.  Home with PT/OT  #COPD: Respiratory status stable on room air. Continue albuterol, nebs and Ellipta  #Hypertension: BP relatively stable with SBP in the 120s to 150s. Continue to hold irbesartan and Lasix.  #Complete heart block s/p pacemaker placement: Ventricularly paced rhythm. HR in the 70s to 90s after     increasing home metoprolol. Continue home metoprolol 50 mg twice daily.  #HLD, Hx CVA: Continue home pravastatin 20 mg daily, ASA 81 mg daily and Plavix 75 mg daily  #Anxiety: Continue home BuSpar 5 mg TID  Diet: Carb modified IVF: None VTE: Lovenox CODE: DNR  Prior to Admission Living Arrangement: Home Anticipated Discharge Location: Home with PT/OT Barriers to Discharge: Decision between SNF versus home with home health Dispo: Anticipated discharge home tomorrow  Signed: Lacinda Axon, MD 11/12/2021, 6:28 AM  Pager: [phone] Internal Medicine Teaching Service After 5pm on weekdays and 1pm on weekends: On Call pager: [phone]

## 2021-11-13 DIAGNOSIS — W19XXXS Unspecified fall, sequela: Secondary | ICD-10-CM | POA: Diagnosis not present

## 2021-11-13 DIAGNOSIS — M25512 Pain in left shoulder: Secondary | ICD-10-CM

## 2021-11-13 DIAGNOSIS — N3001 Acute cystitis with hematuria: Secondary | ICD-10-CM | POA: Diagnosis not present

## 2021-11-13 DIAGNOSIS — E1110 Type 2 diabetes mellitus with ketoacidosis without coma: Secondary | ICD-10-CM | POA: Diagnosis not present

## 2021-11-13 LAB — CBC
HCT: 44.5 % (ref 36.0–46.0)
Hemoglobin: 14.4 g/dL (ref 12.0–15.0)
MCH: 28.9 pg (ref 26.0–34.0)
MCHC: 32.4 g/dL (ref 30.0–36.0)
MCV: 89.4 fL (ref 80.0–100.0)
Platelets: 154 10*3/uL (ref 150–400)
RBC: 4.98 MIL/uL (ref 3.87–5.11)
RDW: 14.6 % (ref 11.5–15.5)
WBC: 8.2 10*3/uL (ref 4.0–10.5)
nRBC: 0 % (ref 0.0–0.2)

## 2021-11-13 LAB — GLUCOSE, CAPILLARY
Glucose-Capillary: 142 mg/dL — ABNORMAL HIGH (ref 70–99)
Glucose-Capillary: 143 mg/dL — ABNORMAL HIGH (ref 70–99)
Glucose-Capillary: 148 mg/dL — ABNORMAL HIGH (ref 70–99)
Glucose-Capillary: 170 mg/dL — ABNORMAL HIGH (ref 70–99)
Glucose-Capillary: 203 mg/dL — ABNORMAL HIGH (ref 70–99)

## 2021-11-13 LAB — BASIC METABOLIC PANEL
Anion gap: 9 (ref 5–15)
BUN: 6 mg/dL — ABNORMAL LOW (ref 8–23)
CO2: 32 mmol/L (ref 22–32)
Calcium: 8.7 mg/dL — ABNORMAL LOW (ref 8.9–10.3)
Chloride: 95 mmol/L — ABNORMAL LOW (ref 98–111)
Creatinine, Ser: 0.58 mg/dL (ref 0.44–1.00)
GFR, Estimated: >60 mL/min (ref >60)
Glucose, Bld: 165 mg/dL — ABNORMAL HIGH (ref 70–99)
Potassium: 3.6 mmol/L (ref 3.5–5.1)
Sodium: 136 mmol/L (ref 135–145)

## 2021-11-13 MED ORDER — NOVOLOG FLEXPEN 100 UNIT/ML ~~LOC~~ SOPN: 5.0000 [IU] | PEN_INJECTOR | Freq: Three times a day (TID) | SUBCUTANEOUS | 3 refills | Status: DC

## 2021-11-13 MED ORDER — TRESIBA FLEXTOUCH 200 UNIT/ML ~~LOC~~ SOPN: 15.0000 [IU] | PEN_INJECTOR | Freq: Every day | SUBCUTANEOUS | 3 refills | Status: DC

## 2021-11-13 NOTE — Progress Notes (Signed)
   Palliative Medicine Inpatient Follow Up Note   HPI:86 year old female  with history of uncontrolled DM type 2, COPD on home oxygen, fatty liver disease, Hyperlipidemia, Hypertension, Complete heart bloc with pacer placement who had an unwitnessed fall and was not found for 3 days. Admitted 7/523 with DKA secondary to uncontrolled type 2 diabetes, hyperkalemia, UTI, AKI, suprapubic mass, and left shoulder pain with no fracture on x-ray. Condition has improved with plans for discharge to home with home health today.  Today's Discussion 11/13/2021  Chart reviewed inclusive of vital signs, progress notes, laboratory results, and diagnostic images.   Created space and opportunity for patient to explore thoughts feelings and fears regarding current medical situation. She is clearly aware of her chronic medical conditions  and fall risk. Plan is for patient to discharge to home today with follow up with CAP social worker for options for AL that would allow a cat.  Encouraged to keep her medic alert button on her and to use her walker consistently when walking. She feels her respiratory status is at baseline. Her only complaint is of the left shoulder pain. Reviewed medications and use of lidoderm patch 12 hours  on 12 off for her shoulder pain.   Questions and concerns addressed.  Palliative Support Provided.   Objective Assessment: Vital Signs Vitals:   11/13/21 0809 11/13/21 0925  BP:  112/79  Pulse:  86  Resp:  16  Temp:  98.1 F (36.7 C)  SpO2: 94% 92%    Intake/Output Summary (Last 24 hours) at 11/13/2021 1132 Last data filed at 11/13/2021 6962 Gross per 24 hour  Intake 483.77 ml  Output 1300 ml  Net -816.23 ml   Last Weight  Most recent update: 11/09/2021  1:15 PM    Weight  58.1 kg (128 lb)             Gen:  NAD HEENT: moist mucous membranes CV: Regular rate and rhythm, no murmurs rubs or gallops PULM: clear to auscultation bilaterally. No wheezes/rales/rhonchi ABD:  soft/nontender/nondistended/normal bowel sounds EXT: No edema Neuro: Alert and oriented x3  SUMMARY OF RECOMMENDATIONS   Code Status/Advance Care Planning:   DNAR/DNI    Symptom Management:  Chronic pain- acetaminophen, lidocaine patch Breathlessness- aformoterol and umeclidinium inhalers daily, albuterol inhaler as needed Physical debility- encourage OT, PT Acid reflux- pantoprazole daily    Additional Recommendations (Limitations, Scope, Preferences): No ventilator, no feeding tube or dialysis even if medically indicated.   Psycho-social/Spiritual:  Desire for further Chaplaincy support: No   Discharge Planning: Home with home health, goal is transition from home to AL. Patient still needs options for potential AL that will accept her with a cat.  Time Spent: 25 minutes  Billing based on MDM: Moderate  Problems Addressed: One or more chronic illnesses with severe exacerbation, progression, or side effects of treatment.  Amount and/or Complexity of Data: Category 1:Review of prior external note(s) from each unique source and Assessment requiring an independent historian(s)  Risks: transition from hospital to home Lindell Spar, NP [place] Team Team Cell Phone: [phone] Please utilize secure chat with additional questions, if there is no response within 30 minutes please call the above phone number  Palliative Medicine Team providers are available by phone from 7am to 7pm daily and can be reached through the team cell phone.  Should this patient require assistance outside of these hours, please call the patient's attending physician.

## 2021-11-13 NOTE — Progress Notes (Signed)
Patient confused thought it was time for breakfast,requesting for food stated 'go  to [employer] and get me food,patient was given her snack at bedtime earlier ,patient was redirected and offered additional snacks.

## 2021-11-13 NOTE — Discharge Summary (Addendum)
Name: [name] MRN: [id number] DOB: 12-04-1935 86 y.o. PCP: Wardell Honour, MD  Date of Admission: 11/09/2021  1:11 PM Date of Discharge: 11/13/2021 Attending Physician: Dr. Daryll Drown  Discharge Diagnosis: Principal Problem:   DKA (diabetic ketoacidosis) [facility]) Active Problems:   Fall   UTI (resolved)  Discharge Medications: Allergies as of 11/13/2021       Reactions   Chlordiazepoxide-clidinium Other (See Comments)   Sleepy,weak   Biaxin [clarithromycin] Other (See Comments)   Foul taste, abd pain, diarrhea   Tape Other (See Comments)   SKIN IS VERY THIN AND WILL TEAR AND BRUISE EASILY!!   Tramadol Other (See Comments)   Caused tremors   Codeine Other (See Comments)   Upset the stomach        Medication List     STOP taking these medications    ALPRAZolam 0.5 MG tablet Commonly known as: Xanax   furosemide 20 MG tablet Commonly known as: LASIX   irbesartan 75 MG tablet Commonly known as: AVAPRO       TAKE these medications    Accu-Chek Aviva Plus test strip Generic drug: glucose blood Use to test blood sugar four times daily. Dx: E11.9   acetaminophen 325 MG tablet Commonly known as: TYLENOL Take 2 tablets (650 mg total) by mouth every 6 (six) hours as needed for mild pain (or Fever >/= 101).   aspirin EC 81 MG tablet Take 81 mg by mouth daily. Swallow whole.   busPIRone 5 MG tablet Commonly known as: BUSPAR Take 5 mg by mouth 3 (three) times daily.   clopidogrel 75 MG tablet Commonly known as: PLAVIX Take 1 tablet (75 mg total) by mouth daily.   fluticasone 110 MCG/ACT inhaler Commonly known as: FLOVENT HFA Inhale 1 puff into the lungs 2 (two) times daily.   ipratropium-albuterol 0.5-2.5 (3) MG/3ML Soln Commonly known as: DUONEB 1 vial in neb every 8 hours and as needed What changed:  how much to take how to take this when to take this reasons to take this additional instructions   levalbuterol 45 MCG/ACT inhaler Commonly  known as: XOPENEX HFA INHALE 1-2 PUFFS EVERY 6 HOURS IF NEEDED FOR WHEEZING. What changed: See the new instructions.   metoprolol succinate 50 MG 24 hr tablet Commonly known as: TOPROL-XL TAKE ONE TABLET TWICE A DAY WITH OR IMMEDIATELY FOLLOWING A MEAL.   mirabegron ER 50 MG Tb24 tablet Commonly known as: MYRBETRIQ Take 1 tablet (50 mg total) by mouth daily.   multivitamin with minerals Tabs tablet Take 1 tablet by mouth daily.   NovoLOG FlexPen 100 UNIT/ML FlexPen Generic drug: insulin aspart Inject 5 Units into the skin 3 (three) times daily with meals. What changed:  how much to take how to take this when to take this additional instructions   pantoprazole 40 MG tablet Commonly known as: Protonix Take 1 tablet (40 mg total) by mouth daily.   pravastatin 20 MG tablet Commonly known as: PRAVACHOL Take 1 tablet (20 mg total) by mouth daily.   Stiolto Respimat 2.5-2.5 MCG/ACT Aers Generic drug: Tiotropium Bromide-Olodaterol Inhale 2 puffs into the lungs daily.   thiamine 100 MG tablet Take 1 tablet (100 mg total) by mouth daily.   Tyler Aas FlexTouch 200 UNIT/ML FlexTouch Pen Generic drug: insulin degludec Inject 16 Units into the skin at bedtime. What changed: how much to take   Ventolin HFA 108 (90 Base) MCG/ACT inhaler Generic drug: albuterol Inhale 2 puffs into the lungs every 6 (six) hours  as needed for wheezing or shortness of breath.               Discharge Care Instructions  (From admission, onward)           Start     Ordered   11/13/21 0000  Discharge wound care:       Comments: Your home health nurse will help you with your wound dressings.  Please also have them assessed at your follow-up appointment.   11/13/21 1246            Disposition and follow-up:   [name] was discharged from [facility] in Stable condition.  At the hospital follow up visit please address:  1.  Follow-up:  a.  Her blood sugars  were adequately controlled on 15 units of long-acting insulin, 5 units of short acting insulin as meal coverage, and minimal sliding scale while in the hospital.  Please reassess and evaluate whether she needs changes in the outpatient setting and if there is anything else we can do to better better control her diabetes.    b.  Check in to make sure she has no further symptoms of UTI.  She brought up concerns for possible interstitial cystitis.  Consider further working this up with a urologist.   c.  She is being discharged home with lots of resources including home health PT/OT, aide, nurse and social worker.  Her ultimate goal is to live in an ALF.  Please ask whether there has been any progress made with regards to this or if her plans have changed.   d.  We have held her Lasix and irbesartan 75 mg.  Please check her blood pressure and assess whether these medications need to be added back on.  2.  Labs / imaging needed at time of follow-up: CBC, EKG  3.  Pending labs/ test needing follow-up: None  Follow-up Appointments:   We have instructed [name] to follow-up with her PCP, Dr. Sabra Heck, within the next couple weeks.  Hospital Course by problem list: [name] is an 86 year old female with past medical history of uncontrolled type 2 diabetes, COPD on home oxygen, fatty liver disease, HLD, HTN, and complete heart block s/p pacemaker who presented to the ED after being found approximately 3 days down after a fall and was admitted for DKA.  Her DKA and UTI both resolved during her admission.  We recommended SNF for her, but she refused SNF placement and stated that she would rather go home.  We had extensive conversations with social work, PT, OT, and we were able to discharge the patient to home with extensive home health resources.  DKA secondary to uncontrolled type 2 diabetes Hyperkalemia Likely 3 days following a fall, the patient presented with anion gap metabolic acidosis, urine  ketones, elevated beta hydroxybutyric acid, and hyperglycemia in the 400s, with subsequent serum osmolality consistent with DKA. She reported no food, fluid, or medicine intake after her fall. Her A1c was greater than 15 on most recent measurement on 10/03/21 and she has had repeat hospitalizations for DKA. Aggressive fluid resuscitation, insulin and calcium gluconate were started in the ED for DKA management. Patient's hyperkalemia, acidosis, urine ketones, and hyperglycemia resolved with this treatment.  Patient was transitioned off insulin drip on hospital day 1 and transition to the following regimen: 15 units long acting insulin daily and 5 units short acting with meals with minimal SSI added on.  Her sugars were adequately controlled in  the hospital setting on this regimen.  She was discharged on 15 units of long-acting and 5 units short acting as meal coverage.   UTI Patient reported dysuria and mild lower abdominal pain. Initial UA was consistent with UTI with WBC of 18.3. Treated as complicated UTI with IV ceftriaxone. Urine culture was positive for lactobacillus and pharmacy recommended transitioning to amoxicillin 500 mg twice daily for 3 days following 2 days of IV ceftriaxone for a total of 5 day treatment course. Patient's leukocytosis resolved and she remained afebrile for >48 hours at time of discharge.   AKI Patient presented with creatinine at 1.21, up from a baseline of 0.7-0.9 with BUN over creatinine ratio of less than 20. Severe dehydration and hypovolemia in the context of DKA versus ATN was suspected. Following aggressive fluid resuscitation, creatinine fell to 0.5 and AKI resolved.  Left shoulder pain Fall/balance issues with walker use at home Patient complained of acute left shoulder pain following her fall. Shoulder X-ray showed no signs of soft tissue trauma or fracture. Pain was adequately managed with conservative treatment.  She is being discharged with home health  PT/OT.  GERD/dysphagia Patient complained of reflux and regurgitation for at least 1 week during this admission. She was continued on home Protonix 40 mg daily and started on Tums.  SLP eval evaluation did not reveal significant dysphagia.  COPD The patient was placed on 2L O2 on admission (goal O2 saturation 88%-92%) due to unclear home oxygen requirement and started on albuterol nebs every 6 hours as needed as well as Brovana nebs twice daily plus Ellipta once daily. Oxygen weaned to room air gradually and patient continued saturating above 90%.  Hypertension Complete heart block status post pacemaker placement Patient's initial hypertension stabilized in the ED with fluids and she entered ventricularly paced rhythm with tachycardia to the 100s. Metoprolol 50 mg extended release daily was started and home irbesartan and Lasix were held in the setting of AKI.  Patient was still tachycardic on once daily dose of metoprolol, and so home regimen of 50 mg twice daily was started with subsequent improvement and patient remaining nontachycardic.  Blood pressures are well managed on just metoprolol 50 mg twice daily, we will continue to hold home ARB and Lasix on discharge.  Suprapubic mass Hard nontender to palpation suprapubic mass was found incidentally on exam. CT abdomen pelvis with contrast was obtained and showed circumferential bladder wall thickening with mucosal hyperenhancement, suspicious for cystitis.  No need for follow-up at this time.  HLD Patient continue on home pravastatin 20 mg daily during this admission.  History of CVA in April 2023 Patient continued on home DAPT with aspirin and Plavix during this admission.  Anxiety Patient continued on home Buspar 46m TID during this admission. Held home Xanax due to patient reports of sleep issues.   Discharge subjective: Patient denying abdominal pain, nausea vomiting, diarrhea, chest pain, shortness of breath at this time.  Feels  significantly improved compared to when she came into the hospital.  Had productive discussions with PT/OT/social work and palliative yesterday and the result of shared decision making was that she would go home with extensive resources.  She feels ready to go home at this time and will likely look into options to transition to ALF over time.  Discharge Vitals:   BP 112/79 (BP Location: Right Arm)   Pulse 86   Temp 98.1 F (36.7 C) (Oral)   Resp 16   Ht 5' 3"  (1.6 m)  Wt 58.1 kg   SpO2 92%   BMI 22.67 kg/m  Discharge exam: Constitutional: Frail-appearing female lying in bed, in no acute distress.  Responds to questions, pleasant and interactive  Cardiovascular: Regular rate with visible pacemaker, extremity pulses 2+ Pulmonary/Chest: Normal work of breathing on room air, lungs clear to auscultation bilaterally Abdominal: soft, non-tender, non-distended.   Extremities:  Decreased sensation in her surface of feet bilaterally, multiple dressings on wounds of feet which are clean dry and intact.    Pertinent Labs, Studies, and Procedures:     Latest Ref Rng & Units 11/13/2021   12:22 AM 11/12/2021   12:57 AM 11/11/2021   12:34 AM  CBC  WBC 4.0 - 10.5 K/uL 8.2  8.0  8.6   Hemoglobin 12.0 - 15.0 g/dL 14.4  14.1  13.1   Hematocrit 36.0 - 46.0 % 44.5  43.1  40.1   Platelets 150 - 400 K/uL 154  159  157        Latest Ref Rng & Units 11/13/2021   12:22 AM 11/12/2021   12:57 AM 11/11/2021   12:34 AM  CMP  Glucose 70 - 99 mg/dL 165  118  330   BUN 8 - 23 mg/dL 6  <5  6   Creatinine 0.44 - 1.00 mg/dL 0.58  0.54  0.57   Sodium 135 - 145 mmol/L 136  135  135   Potassium 3.5 - 5.1 mmol/L 3.6  3.6  3.9   Chloride 98 - 111 mmol/L 95  98  101   CO2 22 - 32 mmol/L 32  31  28   Calcium 8.9 - 10.3 mg/dL 8.7  8.6  8.0   Initial ED evaluation: lactic acid 3.5 beta hydroxybutyrate> 8 VBG: bicarb 17.4 and pH 7.204  UA showing urine glucose above upper limit, positive ketones, and positive leukocytes,  white blood cells, and bacteria Urine cultures positive for lactobacillus, blood cultures negative CK 187  CT ABDOMEN PELVIS W CONTRAST  Result Date: 11/10/2021 CLINICAL DATA:  palpable nontender suprapubic abdominal mass EXAM: CT ABDOMEN AND PELVIS WITH CONTRAST TECHNIQUE: Multidetector CT imaging of the abdomen and pelvis was performed using the standard protocol following bolus administration of intravenous contrast. RADIATION DOSE REDUCTION: This exam was performed according to the departmental dose-optimization program which includes automated exposure control, adjustment of the mA and/or kV according to patient size and/or use of iterative reconstruction technique. CONTRAST:  155m OMNIPAQUE IOHEXOL 300 MG/ML  SOLN COMPARISON:  None Available. FINDINGS: Lower chest: No acute abnormality. Hepatobiliary: Chronic somewhat nodular liver contour with some areas of hypoattenuation. Cholecystectomy. Pancreas: Unremarkable. No pancreatic ductal dilatation or surrounding inflammatory changes. Spleen: Normal in size without focal abnormality. Adrenals/Urinary Tract: Adrenal glands are unremarkable. Kidneys are normal, without renal calculi, focal lesion, or hydronephrosis. There is circumferential bladder wall thickening with mucosal hyperenhancement. Foley catheter in place with gas in the bladder. Stomach/Bowel: Mild wall thickening and edema of the stomach. No evidence of bowel obstruction. Vascular/Lymphatic: Aorto bi-iliac calcific atherosclerosis. No evidence of aortic aneurysm. No adenopathy. Reproductive: Status post hysterectomy. No adnexal masses. Other: No abdominal wall hernia or abnormality. Small amount gas within the right paramidline subcutaneous abdominal wall, presumably related to. No abdominopelvic ascites. Musculoskeletal: Multilevel degenerative disc disease, greatest at L3-L4, L4-L5 and L5-S1 where there is vacuum disc phenomena, disc height loss and endplate sclerosis/spurring. Multilevel  facet arthropathy, severe at L5-S1. IMPRESSION: 1. Circumferential bladder wall thickening with mucosal hyperenhancement, suspicious for cystitis. Correlate with urinalysis. 2. Mild wall  thickening and edema of the stomach. Correlate with signs/symptoms of gastritis. 3. Chronic somewhat nodular liver contour with some areas of hypoattenuation, potentially related to chronic liver disease/cirrhosis and/or fatty deposition. 4.  Aortic Atherosclerosis (ICD10-I70.0). Electronically Signed   By: Margaretha Sheffield M.D.   On: 11/10/2021 13:22   DG Shoulder Left  Result Date: 11/09/2021 CLINICAL DATA:  Fall onto the arm with limited range of motion. EXAM: LEFT SHOULDER - 2+ VIEW COMPARISON:  Shoulder radiographs dated 08/06/2019. FINDINGS: There is no evidence of fracture or dislocation. There is no evidence of arthropathy or other focal bone abnormality. Soft tissues are unremarkable. A left subclavian approach cardiac device is noted. Atherosclerotic disease is noted in the aortic arch. IMPRESSION: No acute osseous injury. Aortic Atherosclerosis (ICD10-I70.0). Electronically Signed   By: Zerita Boers M.D.   On: 11/09/2021 18:25   CT Cervical Spine Wo Contrast  Result Date: 11/09/2021 CLINICAL DATA:  [place] 3 days ago, found down, bruising EXAM: CT CERVICAL SPINE WITHOUT CONTRAST TECHNIQUE: Multidetector CT imaging of the cervical spine was performed without intravenous contrast. Multiplanar CT image reconstructions were also generated. RADIATION DOSE REDUCTION: This exam was performed according to the departmental dose-optimization program which includes automated exposure control, adjustment of the mA and/or kV according to patient size and/or use of iterative reconstruction technique. COMPARISON:  06/21/2017 FINDINGS: Alignment: Slight reversal cervical lordosis centered at the C4 level likely due to adjacent degenerative changes. Otherwise alignment is anatomic. Skull base and vertebrae: No acute fracture. No  primary bone lesion or focal pathologic process. Soft tissues and spinal canal: No prevertebral fluid or swelling. No visible canal hematoma. Disc levels: Prominent spondylosis at C4-5, C5-6, and C6-7. Facet hypertrophic changes greatest at C2-3, C3-4, and C4-5. Upper chest: Airway is patent. Emphysematous changes at the lung apices. Other: Reconstructed images demonstrate no additional findings. IMPRESSION: 1. Multilevel cervical degenerative changes as above. No acute cervical spine fracture. Electronically Signed   By: Randa Ngo M.D.   On: 11/09/2021 15:02   CT HEAD WO CONTRAST (5MM)  Result Date: 11/09/2021 CLINICAL DATA:  [place] 3 days ago, found down, bruising EXAM: CT HEAD WITHOUT CONTRAST TECHNIQUE: Contiguous axial images were obtained from the base of the skull through the vertex without intravenous contrast. RADIATION DOSE REDUCTION: This exam was performed according to the departmental dose-optimization program which includes automated exposure control, adjustment of the mA and/or kV according to patient size and/or use of iterative reconstruction technique. COMPARISON:  08/09/2021 FINDINGS: Brain: No evidence of acute infarction, hemorrhage, hydrocephalus, extra-axial collection or mass lesion/mass effect. Vascular: No hyperdense vessel or unexpected calcification. Skull: Normal. Negative for fracture or focal lesion. Sinuses/Orbits: No acute finding. Other: None. IMPRESSION: 1. Stable head CT, no acute intracranial process. Electronically Signed   By: Randa Ngo M.D.   On: 11/09/2021 14:56   DG CHEST PORT 1 VIEW  Result Date: 11/09/2021 CLINICAL DATA:  Provided history: Fall, pain, history of COPD, on home oxygen. EXAM: PORTABLE CHEST 1 VIEW COMPARISON:  Prior chest radiographs 10/02/2021 and earlier. FINDINGS: Left chest dual lead implantable cardiac device. Heart size within normal limits. Aortic atherosclerosis. No appreciable airspace consolidation or pulmonary edema. No evidence of  pleural effusion or pneumothorax. No acute bony abnormality identified. Degenerative changes of the spine. Mild thoracic levocurvature. IMPRESSION: No evidence of acute cardiopulmonary abnormality. Aortic Atherosclerosis (ICD10-I70.0). Thoracic spondylosis and levocurvature. Electronically Signed   By: Kellie Simmering D.O.   On: 11/09/2021 13:56   DG Pelvis Portable  Result Date: 11/09/2021  CLINICAL DATA:  Fall 3 days ago. EXAM: PORTABLE PELVIS 1-2 VIEWS COMPARISON:  CT of 11/08/2016 FINDINGS: Femoral heads are located. No acute fracture. Sacroiliac joints are symmetric. Degenerate disc disease at multiple lower lumbar levels and lumbosacral junction. IMPRESSION: No acute osseous abnormality. Electronically Signed   By: Abigail Miyamoto M.D.   On: 11/09/2021 13:56     Discharge Instructions: Discharge Instructions     Call MD for:  difficulty breathing, headache or visual disturbances   Complete by: As directed    Call MD for:  persistant dizziness or light-headedness   Complete by: As directed    Call MD for:  persistant nausea and vomiting   Complete by: As directed    Call MD for:  severe uncontrolled pain   Complete by: As directed    Call MD for:  temperature >100.4   Complete by: As directed    Diet - low sodium heart healthy   Complete by: As directed    Diet Carb Modified   Complete by: As directed    Discharge instructions   Complete by: As directed    1.  Please continue to take your diabetes regimen and other medications and instructions as needed.  Call your PCP if you need a refill of those medications. 2.  Please make an appointment with your PCP, Dr. Sabra Heck, in the next 2 weeks. 3.  We are sending you home with home health resources including PT/OT/social work/nurse and an aide.  You were admitted after your fall for a condition called diabetic ketoacidosis and also for treatment of your UTI.  We were able to treat both of these in the hospital, please continue to take your  diabetes regimen to prevent future episodes ketoacidosis.  If you have worsening weakness, shortness of breath, chest pain, or more episodes of fainting or falls, please come to our emergency department to be evaluated.   Discharge wound care:   Complete by: As directed    Your home health nurse will help you with your wound dressings.  Please also have them assessed at your follow-up appointment.   Increase activity slowly   Complete by: As directed        Signed: Linus Galas, MD 11/13/2021, 12:46 PM   Pager: [phone]

## 2021-11-13 NOTE — TOC Progression Note (Signed)
Transition of Care [facility]) - Progression Note    Patient Details  Name: [name] MRN: [id number] Date of Birth: 12-Apr-1936  [facility] CM/SW Contact  Bartholomew Crews, RN Phone Number: [phone] 11/13/2021, 12:24 PM  Clinical Narrative:     Spoke with patient at the bedside to discuss post acute transition. Demographics and PCP verified in Epic. Patient lives alone with her cat, Fredderick Severance. Has a friend, Inez Catalina, who she met recently and assists her with running errands or transportation needs, however, she does not have Betty's contact info at the hospital. Discussed that the hospital could offer her a taxi voucher with [employer]. Patient verified that she has her keys, and stated that she could walk from the taxi to her door.   Verified that patient is active with [facility] for RN. Discussed PT and OT with patient who adamantly refused to do any therapy of any kind with anyone stating the last time she was on her back for days.  Discussed the contact person. Patient stated we were not going to call her saying that she lives in [place]. She stated that her contact person always calls everyone she knows who in turn call her repetitively.   Patient stated that she was working on moving to an ALF but was still making calls and considering cost. Asked if she would like a SW to assist her with this at home, patient declined stating she can do it herself.   Offered patient an aide to assist her with bathing. Patient stated that she can do this herself.   Expected Discharge Plan: [place] Barriers to Discharge: Continued Medical Work up  Expected Discharge Plan and Services Expected Discharge Plan: [place] In-house Referral: Clinical Social Work   Post Acute Care Choice: [place] arrangements for the past 2 months: Apartment                           HH Arranged: RN, PT, OT [place] Agency: [place]  ([facility]) Date [place]: 11/13/21 Time [place]: 1223 Representative spoke with at [place]: [place] ([place]) Interventions    Readmission Risk Interventions     No data to display

## 2021-11-14 ENCOUNTER — Other Ambulatory Visit: Payer: Self-pay | Admitting: *Deleted

## 2021-11-14 ENCOUNTER — Other Ambulatory Visit: Payer: Self-pay | Admitting: Family

## 2021-11-14 ENCOUNTER — Encounter: Payer: Self-pay | Admitting: *Deleted

## 2021-11-14 ENCOUNTER — Telehealth: Payer: Self-pay

## 2021-11-14 DIAGNOSIS — R5381 Other malaise: Secondary | ICD-10-CM

## 2021-11-14 DIAGNOSIS — F0390 Unspecified dementia without behavioral disturbance: Secondary | ICD-10-CM

## 2021-11-14 LAB — CULTURE, BLOOD (ROUTINE X 2)
Culture: NO GROWTH
Culture: NO GROWTH

## 2021-11-14 NOTE — Progress Notes (Signed)
Social worker referral ordered to assist ALF placement.

## 2021-11-14 NOTE — Telephone Encounter (Signed)
Transition Care Management Unsuccessful Follow-up Telephone Call  Date of discharge and from where:  [facility]14/20[name]   Attempts:  1st Attempt  Reason for unsuccessful TCM follow-up call:  Unable to leave message as patients VM was full at this time

## 2021-11-14 NOTE — Patient Outreach (Addendum)
[place] [facility]) Care Management  11/14/2021  [name] [birth year] [id number]   [place] coordination- collaboration with [place] with [place] related to patient Notified pt was discharged home via taxi on 11/13/21 but had been recommended for skilled nursing facility (snf) placement in which she refused adamantly. Pt's verbally aggressive behavior during her hospital stay discussed  Reported active with [facility]  Discussed possible options for patient home care if patient agreeable  Outreached to pcp NP, Dinah N to request orders for Home health social worker via [facility] to assist with assisted living placement as patient discussed interest in placement during recent admission. RN CM also inquired if patient had any history of neurology evaluation for memory concerns. Dinah agrees to order home health social worker and indicated Dementia was noted during the last office visit  Plan [facility] RN CM will follow up with patient within the next 30+ business days    Victoriana Aziz L. Lavina Hamman, RN, BSN, [place] Coordinator Office number [phone]

## 2021-11-15 DIAGNOSIS — J449 Chronic obstructive pulmonary disease, unspecified: Secondary | ICD-10-CM | POA: Diagnosis not present

## 2021-11-15 DIAGNOSIS — E1165 Type 2 diabetes mellitus with hyperglycemia: Secondary | ICD-10-CM | POA: Diagnosis not present

## 2021-11-15 DIAGNOSIS — E1142 Type 2 diabetes mellitus with diabetic polyneuropathy: Secondary | ICD-10-CM | POA: Diagnosis not present

## 2021-11-15 DIAGNOSIS — E11621 Type 2 diabetes mellitus with foot ulcer: Secondary | ICD-10-CM | POA: Diagnosis not present

## 2021-11-15 DIAGNOSIS — L97419 Non-pressure chronic ulcer of right heel and midfoot with unspecified severity: Secondary | ICD-10-CM | POA: Diagnosis not present

## 2021-11-15 DIAGNOSIS — I10 Essential (primary) hypertension: Secondary | ICD-10-CM | POA: Diagnosis not present

## 2021-11-16 LAB — POCT I-STAT EG7
Acid-base deficit: 10 mmol/L — ABNORMAL HIGH (ref 0.0–2.0)
Bicarbonate: 17.4 mmol/L — ABNORMAL LOW (ref 20.0–28.0)
Calcium, Ion: 0.86 mmol/L — CL (ref 1.15–1.40)
HCT: 56 % — ABNORMAL HIGH (ref 36.0–46.0)
Hemoglobin: 19 g/dL — ABNORMAL HIGH (ref 12.0–15.0)
O2 Saturation: 71 %
Potassium: >8.5 mmol/L (ref 3.5–5.1)
Sodium: 122 mmol/L — ABNORMAL LOW (ref 135–145)
TCO2: 19 mmol/L — ABNORMAL LOW (ref 22–32)
pCO2, Ven: 44.2 mmHg (ref 44–60)
pH, Ven: 7.204 — ABNORMAL LOW (ref 7.25–7.43)
pO2, Ven: 45 mmHg (ref 32–45)

## 2021-11-18 ENCOUNTER — Telehealth: Payer: Self-pay

## 2021-11-18 DIAGNOSIS — E11621 Type 2 diabetes mellitus with foot ulcer: Secondary | ICD-10-CM | POA: Diagnosis not present

## 2021-11-18 DIAGNOSIS — I10 Essential (primary) hypertension: Secondary | ICD-10-CM | POA: Diagnosis not present

## 2021-11-18 DIAGNOSIS — E1165 Type 2 diabetes mellitus with hyperglycemia: Secondary | ICD-10-CM | POA: Diagnosis not present

## 2021-11-18 DIAGNOSIS — L97419 Non-pressure chronic ulcer of right heel and midfoot with unspecified severity: Secondary | ICD-10-CM | POA: Diagnosis not present

## 2021-11-18 DIAGNOSIS — E1142 Type 2 diabetes mellitus with diabetic polyneuropathy: Secondary | ICD-10-CM | POA: Diagnosis not present

## 2021-11-18 DIAGNOSIS — J449 Chronic obstructive pulmonary disease, unspecified: Secondary | ICD-10-CM | POA: Diagnosis not present

## 2021-11-18 NOTE — Telephone Encounter (Signed)
Called patient and discussed with the patient. She says she will monitor her blood pressure and blood sugars after she gets of the phone, but she is not going to the ED because she doesn't drive.

## 2021-11-18 NOTE — Telephone Encounter (Signed)
Was at the patient's home before being put out by the patient. Patient was just released from hospital. She hasn't been checking her blood sugars. Blood sugar was too high to even register on monitor. Patient took 14 units of novolog. Caryl Pina just calling to report change in condition and patient refusing to go back to hospital. She educated the patient on signs and symptoms of hyperglycemia.

## 2021-11-18 NOTE — Telephone Encounter (Signed)
Advised patient to go to ED for evaluation of high blood sugars ASAP.

## 2021-11-18 NOTE — Telephone Encounter (Signed)
Noted  

## 2021-11-21 ENCOUNTER — Telehealth: Payer: Self-pay | Admitting: *Deleted

## 2021-11-21 NOTE — Telephone Encounter (Signed)
Message from Referral Coordinator Harle Battiest:  [name] 2036-03-09 It looks like on 7/10 [name] from [facility] contacted patient and she denied services. My team said that when this happens then the office contacts family remember since she has Medicaid and DSS will take it from there.

## 2021-11-22 DIAGNOSIS — I10 Essential (primary) hypertension: Secondary | ICD-10-CM | POA: Diagnosis not present

## 2021-11-22 DIAGNOSIS — E1142 Type 2 diabetes mellitus with diabetic polyneuropathy: Secondary | ICD-10-CM | POA: Diagnosis not present

## 2021-11-22 DIAGNOSIS — E1165 Type 2 diabetes mellitus with hyperglycemia: Secondary | ICD-10-CM | POA: Diagnosis not present

## 2021-11-22 DIAGNOSIS — J449 Chronic obstructive pulmonary disease, unspecified: Secondary | ICD-10-CM | POA: Diagnosis not present

## 2021-11-22 DIAGNOSIS — E11621 Type 2 diabetes mellitus with foot ulcer: Secondary | ICD-10-CM | POA: Diagnosis not present

## 2021-11-22 DIAGNOSIS — L97419 Non-pressure chronic ulcer of right heel and midfoot with unspecified severity: Secondary | ICD-10-CM | POA: Diagnosis not present

## 2021-11-23 ENCOUNTER — Ambulatory Visit: Payer: Medicare Other | Admitting: Family Medicine

## 2021-11-25 ENCOUNTER — Ambulatory Visit: Payer: Medicare Other | Admitting: Family Medicine

## 2021-11-25 ENCOUNTER — Encounter: Payer: Self-pay | Admitting: Family Medicine

## 2021-11-25 ENCOUNTER — Ambulatory Visit: Payer: Medicare Other | Admitting: Family

## 2021-11-25 ENCOUNTER — Ambulatory Visit (INDEPENDENT_AMBULATORY_CARE_PROVIDER_SITE_OTHER): Payer: Medicare Other | Admitting: Family Medicine

## 2021-11-25 VITALS — BP 118/64 | HR 119 | Temp 97.4°F | Resp 18 | Ht 63.0 in | Wt 125.0 lb

## 2021-11-25 DIAGNOSIS — N179 Acute kidney failure, unspecified: Secondary | ICD-10-CM | POA: Diagnosis not present

## 2021-11-25 DIAGNOSIS — N3001 Acute cystitis with hematuria: Secondary | ICD-10-CM | POA: Diagnosis not present

## 2021-11-25 DIAGNOSIS — E1140 Type 2 diabetes mellitus with diabetic neuropathy, unspecified: Secondary | ICD-10-CM

## 2021-11-25 DIAGNOSIS — I639 Cerebral infarction, unspecified: Secondary | ICD-10-CM | POA: Diagnosis not present

## 2021-11-25 DIAGNOSIS — Z794 Long term (current) use of insulin: Secondary | ICD-10-CM

## 2021-11-25 DIAGNOSIS — G9341 Metabolic encephalopathy: Secondary | ICD-10-CM | POA: Diagnosis not present

## 2021-11-25 DIAGNOSIS — J449 Chronic obstructive pulmonary disease, unspecified: Secondary | ICD-10-CM | POA: Diagnosis not present

## 2021-11-25 DIAGNOSIS — I442 Atrioventricular block, complete: Secondary | ICD-10-CM | POA: Diagnosis not present

## 2021-11-25 LAB — POCT URINALYSIS DIPSTICK
Bilirubin, UA: NEGATIVE
Glucose, UA: POSITIVE — AB
Ketones, UA: 80
Nitrite, UA: NEGATIVE
Protein, UA: POSITIVE — AB
Spec Grav, UA: 1.015 (ref 1.010–1.025)
Urobilinogen, UA: 0.2 E.U./dL
pH, UA: 5 (ref 5.0–8.0)

## 2021-11-25 MED ORDER — AMOXICILLIN 500 MG PO CAPS: 500.0000 mg | ORAL_CAPSULE | Freq: Three times a day (TID) | ORAL | 0 refills | Status: AC

## 2021-11-25 NOTE — Progress Notes (Signed)
Provider:  Alain Honey, MD  Careteam: Patient Care Team: Wardell Honour, MD as PCP - General (Family Medicine) Evans Lance, MD as PCP - Electrophysiology (Cardiology) Raynelle Bring, MD as Consulting Physician (Urology) Evans Lance, MD as Consulting Physician (Cardiology) Deneise Lever, MD as Consulting Physician (Pulmonary Disease) [facility] [name], MD as Consulting Physician (Endocrinology) Newt Minion, MD as Consulting Physician (Orthopedic Surgery) Raynelle Bring, MD as Consulting Physician (Urology) Barbaraann Faster, RN as [place] [facility]  PLACE OF SERVICE:  [place] Directive information Does Patient Have a Medical Advance Directive?: No, Would patient like information on creating a medical advance directive?: No - Patient declined  Allergies  Allergen Reactions   Chlordiazepoxide-Clidinium Other (See Comments)    Sleepy,weak   Biaxin [Clarithromycin] Other (See Comments)    Foul taste, abd pain, diarrhea   Tape Other (See Comments)    SKIN IS VERY THIN AND WILL TEAR AND BRUISE EASILY!!   Tramadol Other (See Comments)    Caused tremors   Codeine Other (See Comments)    Upset the stomach    Chief Complaint  Patient presents with   Medical Management of Chronic Issues    Patient is here for a follow up for chronic conditions, hospitalization and urinary frequency/incontinence     HPI: Patient is a 86 y.o. female hospital follow-up for this 86 year old female who was hospitalized after a fall with DKA.  During the course of her hospitalization was treated for lactobacillus in the urine with IV antibiotics as well as amoxicillin.  It was recommended she go to skilled nursing but she declined but is willing to consider assisted living.  Discharge planning was completed and she is receiving some help at home.  She still continues to be resistant to lots of therapies does not take her  medicine as prescribed and her diet is not good.  She does not cook for herself any longer Continues to have problems with urinary frequency and incontinence  Review of Systems:  Review of Systems  Constitutional:  Positive for weight loss.  HENT: Negative.    Respiratory: Negative.    Cardiovascular: Negative.   Genitourinary:  Positive for frequency.  Skin: Negative.   Neurological:  Positive for weakness.  Psychiatric/Behavioral:  Positive for memory loss.     Past Medical History:  Diagnosis Date   Acute ischemic stroke ([place]) 08/10/2021   Anxiety disorder, unspecified    Arthritis    Benign paroxysmal positional vertigo 08/09/2012   Chronic rhinitis    Cognitive communication deficit    Constipation, unspecified    COPD mixed type ([place])    on home O2   Diverticulosis    Esophageal stricture    Essential (primary) hypertension    Gastro-esophageal reflux disease without esophagitis    Glaucoma    Hiatal hernia    Hyperlipidemia, unspecified    Irritable bowel syndrome    Ischemic stroke ([place]) 08/09/2021   Presence of permanent cardiac pacemaker 01/14/2020   Spinal stenosis, lumbar region, without neurogenic claudication    Steatohepatitis    Thrombocytopenia, unspecified ([place])    Type 2 diabetes mellitus with diabetic polyneuropathy ([place])    Past Surgical History:  Procedure Laterality Date   APPENDECTOMY     CARPAL TUNNEL RELEASE     right hand   CHOLECYSTECTOMY OPEN  1978   COLONOSCOPY  07-2001   mild diverticulosis   ESOPHAGOGASTRODUODENOSCOPY  7619,[id number]  H Hernia,es.stricture s/p dil 28F   INTRAOCULAR LENS INSERTION Bilateral    LIVER BIOPSY  [id number]   PACEMAKER IMPLANT N/A 01/14/2020   Procedure: PACEMAKER IMPLANT;  Surgeon: Evans Lance, MD;  Location: [place] [facility];  Service: Cardiovascular;  Laterality: N/A;   PARATHYROID EXPLORATION     TONSILLECTOMY AND ADENOIDECTOMY     TOTAL ABDOMINAL HYSTERECTOMY     ULNAR NERVE TRANSPOSITION  12/07/2011    Procedure: ULNAR NERVE DECOMPRESSION/TRANSPOSITION;this was cancelled-not done  Surgeon: Cammie Sickle., MD;  Location: [facility];  Service: Orthopedics;  Laterality: Right;  right ulnar nerve in situ decompression   ULNAR TUNNEL RELEASE  03/07/2012   Procedure: CUBITAL TUNNEL RELEASE;  Surgeon: Roseanne Kaufman, MD;  Location: [place];  Service: Orthopedics;  Laterality: Right;  ulnar nerve release at the elbow     Social History:   reports that she quit smoking about 33 years ago. Her smoking use included cigarettes. She has never used smokeless tobacco. She reports that she does not drink alcohol and does not use drugs.  Family History  Problem Relation Age of Onset   Heart disease Father    Lung cancer Mother        small cell;Byssinosis   Lung cancer Sister    Liver cancer Sister        ? mets from another area of the body   Diabetes Other        grandmother   Stroke Maternal Grandfather     Medications: Patient's Medications  New Prescriptions   AMOXICILLIN (AMOXIL) 500 MG CAPSULE    Take 1 capsule (500 mg total) by mouth 3 (three) times daily for 10 days.  Previous Medications   ACETAMINOPHEN (TYLENOL) 325 MG TABLET    Take 2 tablets (650 mg total) by mouth every 6 (six) hours as needed for mild pain (or Fever >/= 101).   ASPIRIN EC 81 MG TABLET    Take 81 mg by mouth daily. Swallow whole.   BUSPIRONE (BUSPAR) 5 MG TABLET    Take 5 mg by mouth 3 (three) times daily.   CLOPIDOGREL (PLAVIX) 75 MG TABLET    Take 1 tablet (75 mg total) by mouth daily.   FLUTICASONE (FLOVENT HFA) 110 MCG/ACT INHALER    Inhale 1 puff into the lungs 2 (two) times daily.   GLUCOSE BLOOD (ACCU-CHEK AVIVA PLUS) TEST STRIP    Use to test blood sugar four times daily. Dx: E11.9   INSULIN ASPART (NOVOLOG FLEXPEN) 100 UNIT/ML FLEXPEN    Inject 5 Units into the skin 3 (three) times daily with meals.   INSULIN DEGLUDEC (TRESIBA FLEXTOUCH) 200 UNIT/ML FLEXTOUCH PEN     Inject 16 Units into the skin at bedtime.   IPRATROPIUM-ALBUTEROL (DUONEB) 0.5-2.5 (3) MG/3ML SOLN    1 vial in neb every 8 hours and as needed   LEVALBUTEROL (XOPENEX HFA) 45 MCG/ACT INHALER    INHALE 1-2 PUFFS EVERY 6 HOURS IF NEEDED FOR WHEEZING.   METOPROLOL SUCCINATE (TOPROL-XL) 50 MG 24 HR TABLET    TAKE ONE TABLET TWICE A DAY WITH OR IMMEDIATELY FOLLOWING A MEAL.   MIRABEGRON ER (MYRBETRIQ) 50 MG TB24 TABLET    Take 1 tablet (50 mg total) by mouth daily.   MULTIPLE VITAMIN (MULTIVITAMIN WITH MINERALS) TABS TABLET    Take 1 tablet by mouth daily.   PANTOPRAZOLE (PROTONIX) 40 MG TABLET    Take 1 tablet (40 mg total) by mouth daily.  PRAVASTATIN (PRAVACHOL) 20 MG TABLET    Take 1 tablet (20 mg total) by mouth daily.   THIAMINE 100 MG TABLET    Take 1 tablet (100 mg total) by mouth daily.   TIOTROPIUM BROMIDE-OLODATEROL (STIOLTO RESPIMAT) 2.5-2.5 MCG/ACT AERS    Inhale 2 puffs into the lungs daily.   VENTOLIN HFA 108 (90 BASE) MCG/ACT INHALER    Inhale 2 puffs into the lungs every 6 (six) hours as needed for wheezing or shortness of breath.  Modified Medications   No medications on file  Discontinued Medications   No medications on file    Physical Exam:  Vitals:   11/25/21 1336  BP: 118/64  Pulse: (!) 119  Resp: 18  Temp: (!) 97.4 F (36.3 C)  SpO2: 94%  Weight: 125 lb (56.7 kg)  Height: 5' 3"  (1.6 m)   Body mass index is 22.14 kg/m. Wt Readings from Last 3 Encounters:  11/25/21 125 lb (56.7 kg)  11/09/21 128 lb (58.1 kg)  10/31/21 128 lb (58.1 kg)    Physical Exam Vitals and nursing note reviewed.  Constitutional:      Appearance: Normal appearance.  Cardiovascular:     Rate and Rhythm: Normal rate and regular rhythm.  Pulmonary:     Effort: Pulmonary effort is normal.     Breath sounds: Normal breath sounds.  Musculoskeletal:        General: Normal range of motion.     Comments: Patient is in wheelchair at time of visit.  Apparently uses walker some around her  apartment  Neurological:     General: No focal deficit present.     Mental Status: She is alert and oriented to person, place, and time.     Labs reviewed: Basic Metabolic Panel: Recent Labs    08/10/21 0438 08/13/21 0209 10/03/21 0329 10/03/21 0731 11/11/21 0034 11/12/21 0057 11/13/21 0022  NA  --    < > 140   < > 135 135 136  K  --    < > 4.1   < > 3.9 3.6 3.6  CL  --    < > 106   < > 101 98 95*  CO2  --    < > 17*   < > 28 31 32  GLUCOSE  --    < > 253*   < > 330* 118* 165*  BUN  --    < > 28*   < > 6* <5* 6*  CREATININE  --    < > 1.27*   < > 0.57 0.54 0.58  CALCIUM  --    < > 9.2   < > 8.0* 8.6* 8.7*  MG  --   --  1.8  --   --   --   --   TSH 1.621  --   --   --   --   --   --    < > = values in this interval not displayed.   Liver Function Tests: Recent Labs    10/02/21 1835 10/28/21 1217 11/09/21 1532  AST 12* 14* 17  ALT 12 9 11   ALKPHOS 62 52 63  BILITOT 1.8* 0.9 1.8*  PROT 8.6* 6.7 6.6  ALBUMIN 4.2 3.4* 3.3*   Recent Labs    10/02/21 1835  LIPASE 24   Recent Labs    08/09/21 1315  AMMONIA 40*   CBC: Recent Labs    10/03/21 0329 10/04/21 0714 10/28/21 1217 10/28/21 1226 11/09/21 1330 11/09/21  1340 11/11/21 0034 11/12/21 0057 11/13/21 0022  WBC 11.8*   < > 7.0  --  18.3*   < > 8.6 8.0 8.2  NEUTROABS 8.2*  --  5.0  --  16.3*  --   --   --   --   HGB 17.0*   < > 14.9   < > 17.3*   < > 13.1 14.1 14.4  HCT 52.5*   < > 46.5*   < > 55.5*   < > 40.1 43.1 44.5  MCV 89.3   < > 91.5  --  92.7   < > 89.7 89.0 89.4  PLT 188   < > 189  --  336   < > 157 159 154   < > = values in this interval not displayed.   Lipid Panel: Recent Labs    05/31/21 1119 08/09/21 1315  CHOL 125 90  HDL 55 38*  LDLCALC 48 42  TRIG 135 50  CHOLHDL 2.3 2.4   TSH: Recent Labs    08/10/21 0438  TSH 1.621   A1C: Lab Results  Component Value Date   HGBA1C >15.5 (H) 10/03/2021     Assessment/Plan  1. Acute cystitis with hematuria Dip urine here in the  office showed nitrites and blood.  We will culture and continue amoxicillin  2. Acute metabolic encephalopathy This was related to serum sodium concentration.  Not an issue this past hospitalization  3. AKI (acute kidney injury) ([place]) Related to dehydration after patient laid in the floor after fall  4. COPD mixed type ([place]) Denies any shortness of breath or cough.  Doubt that she is using inhalers  5. Complete heart block [facility]) Patient has pacemaker functioning normally  6. Type 2 diabetes mellitus with diabetic neuropathy, with long-term current use of insulin (H[facility]) Currently takes 15 units of Tresiba and 5 units of NovoLog with meals but this is variable because she does not always eat meals.   Alain Honey, MD [facility] [phone]

## 2021-11-26 LAB — URINE CULTURE
MICRO NUMBER:: 13679616
SPECIMEN QUALITY:: ADEQUATE

## 2021-11-29 ENCOUNTER — Ambulatory Visit: Payer: Medicare Other | Admitting: Family Medicine

## 2021-12-01 DIAGNOSIS — H26491 Other secondary cataract, right eye: Secondary | ICD-10-CM | POA: Diagnosis not present

## 2021-12-01 DIAGNOSIS — R350 Frequency of micturition: Secondary | ICD-10-CM | POA: Diagnosis not present

## 2021-12-01 DIAGNOSIS — H18513 Endothelial corneal dystrophy, bilateral: Secondary | ICD-10-CM | POA: Diagnosis not present

## 2021-12-01 DIAGNOSIS — E1142 Type 2 diabetes mellitus with diabetic polyneuropathy: Secondary | ICD-10-CM | POA: Diagnosis not present

## 2021-12-01 DIAGNOSIS — H8110 Benign paroxysmal vertigo, unspecified ear: Secondary | ICD-10-CM | POA: Diagnosis not present

## 2021-12-01 DIAGNOSIS — E11621 Type 2 diabetes mellitus with foot ulcer: Secondary | ICD-10-CM | POA: Diagnosis not present

## 2021-12-01 DIAGNOSIS — Z87440 Personal history of urinary (tract) infections: Secondary | ICD-10-CM | POA: Diagnosis not present

## 2021-12-01 DIAGNOSIS — I10 Essential (primary) hypertension: Secondary | ICD-10-CM | POA: Diagnosis not present

## 2021-12-01 DIAGNOSIS — K589 Irritable bowel syndrome without diarrhea: Secondary | ICD-10-CM | POA: Diagnosis not present

## 2021-12-01 DIAGNOSIS — K7581 Nonalcoholic steatohepatitis (NASH): Secondary | ICD-10-CM | POA: Diagnosis not present

## 2021-12-01 DIAGNOSIS — E785 Hyperlipidemia, unspecified: Secondary | ICD-10-CM | POA: Diagnosis not present

## 2021-12-01 DIAGNOSIS — F0394 Unspecified dementia, unspecified severity, with anxiety: Secondary | ICD-10-CM | POA: Diagnosis not present

## 2021-12-01 DIAGNOSIS — J449 Chronic obstructive pulmonary disease, unspecified: Secondary | ICD-10-CM | POA: Diagnosis not present

## 2021-12-01 DIAGNOSIS — K219 Gastro-esophageal reflux disease without esophagitis: Secondary | ICD-10-CM | POA: Diagnosis not present

## 2021-12-01 DIAGNOSIS — H04123 Dry eye syndrome of bilateral lacrimal glands: Secondary | ICD-10-CM | POA: Diagnosis not present

## 2021-12-01 DIAGNOSIS — K449 Diaphragmatic hernia without obstruction or gangrene: Secondary | ICD-10-CM | POA: Diagnosis not present

## 2021-12-01 DIAGNOSIS — J310 Chronic rhinitis: Secondary | ICD-10-CM | POA: Diagnosis not present

## 2021-12-01 DIAGNOSIS — Z4800 Encounter for change or removal of nonsurgical wound dressing: Secondary | ICD-10-CM | POA: Diagnosis not present

## 2021-12-01 DIAGNOSIS — M48061 Spinal stenosis, lumbar region without neurogenic claudication: Secondary | ICD-10-CM | POA: Diagnosis not present

## 2021-12-01 DIAGNOSIS — L97419 Non-pressure chronic ulcer of right heel and midfoot with unspecified severity: Secondary | ICD-10-CM | POA: Diagnosis not present

## 2021-12-01 DIAGNOSIS — K5790 Diverticulosis of intestine, part unspecified, without perforation or abscess without bleeding: Secondary | ICD-10-CM | POA: Diagnosis not present

## 2021-12-01 DIAGNOSIS — H409 Unspecified glaucoma: Secondary | ICD-10-CM | POA: Diagnosis not present

## 2021-12-01 DIAGNOSIS — R32 Unspecified urinary incontinence: Secondary | ICD-10-CM | POA: Diagnosis not present

## 2021-12-01 DIAGNOSIS — H15103 Unspecified episcleritis, bilateral: Secondary | ICD-10-CM | POA: Diagnosis not present

## 2021-12-01 DIAGNOSIS — D696 Thrombocytopenia, unspecified: Secondary | ICD-10-CM | POA: Diagnosis not present

## 2021-12-01 DIAGNOSIS — E1110 Type 2 diabetes mellitus with ketoacidosis without coma: Secondary | ICD-10-CM | POA: Diagnosis not present

## 2021-12-01 DIAGNOSIS — H35372 Puckering of macula, left eye: Secondary | ICD-10-CM | POA: Diagnosis not present

## 2021-12-01 DIAGNOSIS — R41841 Cognitive communication deficit: Secondary | ICD-10-CM | POA: Diagnosis not present

## 2021-12-01 DIAGNOSIS — M1990 Unspecified osteoarthritis, unspecified site: Secondary | ICD-10-CM | POA: Diagnosis not present

## 2021-12-01 DIAGNOSIS — K222 Esophageal obstruction: Secondary | ICD-10-CM | POA: Diagnosis not present

## 2021-12-05 ENCOUNTER — Ambulatory Visit (INDEPENDENT_AMBULATORY_CARE_PROVIDER_SITE_OTHER): Payer: Medicare Other

## 2021-12-05 DIAGNOSIS — E11621 Type 2 diabetes mellitus with foot ulcer: Secondary | ICD-10-CM | POA: Diagnosis not present

## 2021-12-05 DIAGNOSIS — I442 Atrioventricular block, complete: Secondary | ICD-10-CM

## 2021-12-05 DIAGNOSIS — I10 Essential (primary) hypertension: Secondary | ICD-10-CM | POA: Diagnosis not present

## 2021-12-05 DIAGNOSIS — J449 Chronic obstructive pulmonary disease, unspecified: Secondary | ICD-10-CM | POA: Diagnosis not present

## 2021-12-05 DIAGNOSIS — L97419 Non-pressure chronic ulcer of right heel and midfoot with unspecified severity: Secondary | ICD-10-CM | POA: Diagnosis not present

## 2021-12-05 DIAGNOSIS — E1142 Type 2 diabetes mellitus with diabetic polyneuropathy: Secondary | ICD-10-CM | POA: Diagnosis not present

## 2021-12-05 DIAGNOSIS — E1110 Type 2 diabetes mellitus with ketoacidosis without coma: Secondary | ICD-10-CM | POA: Diagnosis not present

## 2021-12-05 LAB — CUP PACEART REMOTE DEVICE CHECK
Battery Remaining Longevity: 96 mo
Battery Remaining Percentage: 84 %
Battery Voltage: 3.01 V
Brady Statistic AP VP Percent: 1.5 %
Brady Statistic AP VS Percent: 1 %
Brady Statistic AS VP Percent: 98 %
Brady Statistic AS VS Percent: 1 %
Brady Statistic RA Percent Paced: 1.4 %
Brady Statistic RV Percent Paced: 99 %
Date Time Interrogation Session: 20230730045513
Implantable Lead Implant Date: 20210908
Implantable Lead Implant Date: 20210908
Implantable Lead Location: 753859
Implantable Lead Location: 753860
Implantable Pulse Generator Implant Date: 20210908
Lead Channel Impedance Value: 450 Ohm
Lead Channel Impedance Value: 530 Ohm
Lead Channel Pacing Threshold Amplitude: 0.75 V
Lead Channel Pacing Threshold Amplitude: 0.875 V
Lead Channel Pacing Threshold Pulse Width: 0.4 ms
Lead Channel Pacing Threshold Pulse Width: 0.5 ms
Lead Channel Sensing Intrinsic Amplitude: 4.3 mV
Lead Channel Sensing Intrinsic Amplitude: 9.5 mV
Lead Channel Setting Pacing Amplitude: 1.125
Lead Channel Setting Pacing Amplitude: 2 V
Lead Channel Setting Pacing Pulse Width: 0.5 ms
Lead Channel Setting Sensing Sensitivity: 2 mV
Pulse Gen Model: 2272
Pulse Gen Serial Number: 3856705

## 2021-12-08 ENCOUNTER — Encounter (HOSPITAL_COMMUNITY): Payer: Self-pay | Admitting: Emergency Medicine

## 2021-12-08 ENCOUNTER — Emergency Department (HOSPITAL_COMMUNITY): Payer: Medicare Other

## 2021-12-08 ENCOUNTER — Inpatient Hospital Stay (HOSPITAL_COMMUNITY)
Admission: EM | Admit: 2021-12-08 | Discharge: 2021-12-12 | DRG: 871 | Disposition: A | Discharge status: Home Health | Payer: Medicare Other | Attending: Family Medicine | Admitting: Family Medicine

## 2021-12-08 ENCOUNTER — Telehealth: Payer: Self-pay

## 2021-12-08 ENCOUNTER — Other Ambulatory Visit: Payer: Self-pay

## 2021-12-08 DIAGNOSIS — J449 Chronic obstructive pulmonary disease, unspecified: Secondary | ICD-10-CM | POA: Diagnosis present

## 2021-12-08 DIAGNOSIS — R000 Tachycardia, unspecified: Secondary | ICD-10-CM | POA: Diagnosis not present

## 2021-12-08 DIAGNOSIS — E43 Unspecified severe protein-calorie malnutrition: Secondary | ICD-10-CM | POA: Diagnosis present

## 2021-12-08 DIAGNOSIS — E1142 Type 2 diabetes mellitus with diabetic polyneuropathy: Secondary | ICD-10-CM | POA: Diagnosis present

## 2021-12-08 DIAGNOSIS — Z8249 Family history of ischemic heart disease and other diseases of the circulatory system: Secondary | ICD-10-CM

## 2021-12-08 DIAGNOSIS — Z885 Allergy status to narcotic agent status: Secondary | ICD-10-CM

## 2021-12-08 DIAGNOSIS — Z7985 Long-term (current) use of injectable non-insulin antidiabetic drugs: Secondary | ICD-10-CM

## 2021-12-08 DIAGNOSIS — A419 Sepsis, unspecified organism: Secondary | ICD-10-CM | POA: Diagnosis present

## 2021-12-08 DIAGNOSIS — R6520 Severe sepsis without septic shock: Secondary | ICD-10-CM | POA: Diagnosis present

## 2021-12-08 DIAGNOSIS — R4182 Altered mental status, unspecified: Secondary | ICD-10-CM | POA: Diagnosis not present

## 2021-12-08 DIAGNOSIS — K7581 Nonalcoholic steatohepatitis (NASH): Secondary | ICD-10-CM | POA: Diagnosis not present

## 2021-12-08 DIAGNOSIS — Z950 Presence of cardiac pacemaker: Secondary | ICD-10-CM

## 2021-12-08 DIAGNOSIS — R531 Weakness: Secondary | ICD-10-CM | POA: Diagnosis not present

## 2021-12-08 DIAGNOSIS — Z833 Family history of diabetes mellitus: Secondary | ICD-10-CM

## 2021-12-08 DIAGNOSIS — Z794 Long term (current) use of insulin: Secondary | ICD-10-CM

## 2021-12-08 DIAGNOSIS — N390 Urinary tract infection, site not specified: Secondary | ICD-10-CM | POA: Diagnosis not present

## 2021-12-08 DIAGNOSIS — Z1330 Encounter for screening examination for mental health and behavioral disorders, unspecified: Secondary | ICD-10-CM | POA: Diagnosis not present

## 2021-12-08 DIAGNOSIS — R739 Hyperglycemia, unspecified: Secondary | ICD-10-CM | POA: Diagnosis not present

## 2021-12-08 DIAGNOSIS — F0390 Unspecified dementia without behavioral disturbance: Secondary | ICD-10-CM | POA: Diagnosis not present

## 2021-12-08 DIAGNOSIS — Z87891 Personal history of nicotine dependence: Secondary | ICD-10-CM

## 2021-12-08 DIAGNOSIS — I10 Essential (primary) hypertension: Secondary | ICD-10-CM | POA: Diagnosis not present

## 2021-12-08 DIAGNOSIS — K219 Gastro-esophageal reflux disease without esophagitis: Secondary | ICD-10-CM | POA: Diagnosis present

## 2021-12-08 DIAGNOSIS — Z8673 Personal history of transient ischemic attack (TIA), and cerebral infarction without residual deficits: Secondary | ICD-10-CM | POA: Diagnosis not present

## 2021-12-08 DIAGNOSIS — D751 Secondary polycythemia: Secondary | ICD-10-CM

## 2021-12-08 DIAGNOSIS — E1165 Type 2 diabetes mellitus with hyperglycemia: Secondary | ICD-10-CM | POA: Diagnosis not present

## 2021-12-08 DIAGNOSIS — Z91048 Other nonmedicinal substance allergy status: Secondary | ICD-10-CM

## 2021-12-08 DIAGNOSIS — Z9981 Dependence on supplemental oxygen: Secondary | ICD-10-CM

## 2021-12-08 DIAGNOSIS — Z881 Allergy status to other antibiotic agents status: Secondary | ICD-10-CM | POA: Diagnosis not present

## 2021-12-08 DIAGNOSIS — N179 Acute kidney failure, unspecified: Secondary | ICD-10-CM | POA: Diagnosis present

## 2021-12-08 DIAGNOSIS — Z681 Body mass index (BMI) 19 or less, adult: Secondary | ICD-10-CM

## 2021-12-08 DIAGNOSIS — N3000 Acute cystitis without hematuria: Secondary | ICD-10-CM | POA: Diagnosis not present

## 2021-12-08 DIAGNOSIS — J9610 Chronic respiratory failure, unspecified whether with hypoxia or hypercapnia: Secondary | ICD-10-CM | POA: Diagnosis present

## 2021-12-08 DIAGNOSIS — I442 Atrioventricular block, complete: Secondary | ICD-10-CM | POA: Diagnosis present

## 2021-12-08 DIAGNOSIS — E860 Dehydration: Secondary | ICD-10-CM | POA: Diagnosis not present

## 2021-12-08 DIAGNOSIS — G9340 Encephalopathy, unspecified: Secondary | ICD-10-CM

## 2021-12-08 DIAGNOSIS — R404 Transient alteration of awareness: Secondary | ICD-10-CM | POA: Diagnosis not present

## 2021-12-08 DIAGNOSIS — G9341 Metabolic encephalopathy: Secondary | ICD-10-CM | POA: Diagnosis present

## 2021-12-08 DIAGNOSIS — E785 Hyperlipidemia, unspecified: Secondary | ICD-10-CM | POA: Diagnosis present

## 2021-12-08 DIAGNOSIS — Z7902 Long term (current) use of antithrombotics/antiplatelets: Secondary | ICD-10-CM

## 2021-12-08 DIAGNOSIS — F0394 Unspecified dementia, unspecified severity, with anxiety: Secondary | ICD-10-CM | POA: Diagnosis present

## 2021-12-08 DIAGNOSIS — Z888 Allergy status to other drugs, medicaments and biological substances status: Secondary | ICD-10-CM | POA: Diagnosis not present

## 2021-12-08 DIAGNOSIS — R0689 Other abnormalities of breathing: Secondary | ICD-10-CM | POA: Diagnosis not present

## 2021-12-08 DIAGNOSIS — H409 Unspecified glaucoma: Secondary | ICD-10-CM | POA: Diagnosis present

## 2021-12-08 DIAGNOSIS — Z7982 Long term (current) use of aspirin: Secondary | ICD-10-CM

## 2021-12-08 DIAGNOSIS — Z79899 Other long term (current) drug therapy: Secondary | ICD-10-CM

## 2021-12-08 DIAGNOSIS — K589 Irritable bowel syndrome without diarrhea: Secondary | ICD-10-CM | POA: Diagnosis present

## 2021-12-08 LAB — CBC WITH DIFFERENTIAL/PLATELET
Abs Immature Granulocytes: 0.07 10*3/uL (ref 0.00–0.07)
Basophils Absolute: 0.1 10*3/uL (ref 0.0–0.1)
Basophils Relative: 1 %
Eosinophils Absolute: 0 10*3/uL (ref 0.0–0.5)
Eosinophils Relative: 0 %
HCT: 56.8 % — ABNORMAL HIGH (ref 36.0–46.0)
Hemoglobin: 18.6 g/dL — ABNORMAL HIGH (ref 12.0–15.0)
Immature Granulocytes: 1 %
Lymphocytes Relative: 22 %
Lymphs Abs: 2.8 10*3/uL (ref 0.7–4.0)
MCH: 29.4 pg (ref 26.0–34.0)
MCHC: 32.7 g/dL (ref 30.0–36.0)
MCV: 89.9 fL (ref 80.0–100.0)
Monocytes Absolute: 0.9 10*3/uL (ref 0.1–1.0)
Monocytes Relative: 7 %
Neutro Abs: 8.8 10*3/uL — ABNORMAL HIGH (ref 1.7–7.7)
Neutrophils Relative %: 69 %
Platelets: 335 10*3/uL (ref 150–400)
RBC: 6.32 MIL/uL — ABNORMAL HIGH (ref 3.87–5.11)
RDW: 15 % (ref 11.5–15.5)
WBC: 12.7 10*3/uL — ABNORMAL HIGH (ref 4.0–10.5)
nRBC: 0 % (ref 0.0–0.2)

## 2021-12-08 LAB — URINALYSIS, ROUTINE W REFLEX MICROSCOPIC
Bilirubin Urine: NEGATIVE
Glucose, UA: >=500 mg/dL — AB
Ketones, ur: 80 mg/dL — AB
Nitrite: NEGATIVE
Protein, ur: 30 mg/dL — AB
Specific Gravity, Urine: 1.02 (ref 1.005–1.030)
WBC, UA: >50 WBC/hpf — ABNORMAL HIGH (ref 0–5)
pH: 5 (ref 5.0–8.0)

## 2021-12-08 LAB — I-STAT VENOUS BLOOD GAS, ED
Acid-Base Excess: 6 mmol/L — ABNORMAL HIGH (ref 0.0–2.0)
Bicarbonate: 31.5 mmol/L — ABNORMAL HIGH (ref 20.0–28.0)
Calcium, Ion: 1.07 mmol/L — ABNORMAL LOW (ref 1.15–1.40)
HCT: 59 % — ABNORMAL HIGH (ref 36.0–46.0)
Hemoglobin: 20.1 g/dL — ABNORMAL HIGH (ref 12.0–15.0)
O2 Saturation: 55 %
Potassium: 4.3 mmol/L (ref 3.5–5.1)
Sodium: 136 mmol/L (ref 135–145)
TCO2: 33 mmol/L — ABNORMAL HIGH (ref 22–32)
pCO2, Ven: 45.9 mmHg (ref 44–60)
pH, Ven: 7.444 — ABNORMAL HIGH (ref 7.25–7.43)
pO2, Ven: 28 mmHg — CL (ref 32–45)

## 2021-12-08 LAB — COMPREHENSIVE METABOLIC PANEL
ALT: 14 U/L (ref 0–44)
AST: 17 U/L (ref 15–41)
Albumin: 4.6 g/dL (ref 3.5–5.0)
Alkaline Phosphatase: 79 U/L (ref 38–126)
Anion gap: 17 — ABNORMAL HIGH (ref 5–15)
BUN: 34 mg/dL — ABNORMAL HIGH (ref 8–23)
CO2: 27 mmol/L (ref 22–32)
Calcium: 10.7 mg/dL — ABNORMAL HIGH (ref 8.9–10.3)
Chloride: 92 mmol/L — ABNORMAL LOW (ref 98–111)
Creatinine, Ser: 1.13 mg/dL — ABNORMAL HIGH (ref 0.44–1.00)
GFR, Estimated: 48 mL/min — ABNORMAL LOW (ref >60)
Glucose, Bld: 221 mg/dL — ABNORMAL HIGH (ref 70–99)
Potassium: 4.2 mmol/L (ref 3.5–5.1)
Sodium: 136 mmol/L (ref 135–145)
Total Bilirubin: 0.9 mg/dL (ref 0.3–1.2)
Total Protein: 8.9 g/dL — ABNORMAL HIGH (ref 6.5–8.1)

## 2021-12-08 LAB — CBG MONITORING, ED: Glucose-Capillary: 272 mg/dL — ABNORMAL HIGH (ref 70–99)

## 2021-12-08 LAB — LIPASE, BLOOD: Lipase: 34 U/L (ref 11–51)

## 2021-12-08 LAB — LACTIC ACID, PLASMA
Lactic Acid, Venous: 2.5 mmol/L (ref 0.5–1.9)
Lactic Acid, Venous: 1.9 mmol/L (ref 0.5–1.9)

## 2021-12-08 MED ORDER — ASPIRIN 81 MG PO TBEC
81.0000 mg | DELAYED_RELEASE_TABLET | Freq: Every day | ORAL | Status: DC
  Administered 2021-12-09 – 2021-12-12 (×4): 81 mg via ORAL
  Filled 2021-12-08 (×4): qty 1

## 2021-12-08 MED ORDER — SODIUM CHLORIDE 0.9 % IV SOLN
1.0000 g | Freq: Once | INTRAVENOUS | Status: AC
  Administered 2021-12-08: 1 g via INTRAVENOUS
  Filled 2021-12-08: qty 10

## 2021-12-08 MED ORDER — METOPROLOL SUCCINATE ER 25 MG PO TB24
50.0000 mg | ORAL_TABLET | Freq: Every day | ORAL | Status: DC
  Administered 2021-12-09 – 2021-12-12 (×4): 50 mg via ORAL
  Filled 2021-12-08 (×4): qty 2

## 2021-12-08 MED ORDER — SODIUM CHLORIDE 0.9 % IV BOLUS
500.0000 mL | Freq: Once | INTRAVENOUS | Status: AC
  Administered 2021-12-08: 500 mL via INTRAVENOUS

## 2021-12-08 MED ORDER — PANTOPRAZOLE SODIUM 40 MG PO TBEC
40.0000 mg | DELAYED_RELEASE_TABLET | Freq: Every day | ORAL | Status: DC
  Administered 2021-12-09 – 2021-12-12 (×4): 40 mg via ORAL
  Filled 2021-12-08 (×4): qty 1

## 2021-12-08 MED ORDER — ARFORMOTEROL TARTRATE 15 MCG/2ML IN NEBU
15.0000 ug | INHALATION_SOLUTION | Freq: Two times a day (BID) | RESPIRATORY_TRACT | Status: DC
  Administered 2021-12-09 – 2021-12-11 (×6): 15 ug via RESPIRATORY_TRACT
  Filled 2021-12-08 (×7): qty 2

## 2021-12-08 MED ORDER — ALBUTEROL SULFATE (2.5 MG/3ML) 0.083% IN NEBU: 2.5000 mg | INHALATION_SOLUTION | RESPIRATORY_TRACT | Status: DC | PRN

## 2021-12-08 MED ORDER — LEVALBUTEROL TARTRATE 45 MCG/ACT IN AERO: 1.0000 | INHALATION_SPRAY | Freq: Four times a day (QID) | RESPIRATORY_TRACT | Status: DC | PRN

## 2021-12-08 MED ORDER — UMECLIDINIUM BROMIDE 62.5 MCG/ACT IN AEPB
1.0000 | INHALATION_SPRAY | Freq: Every day | RESPIRATORY_TRACT | Status: DC
  Administered 2021-12-09 – 2021-12-11 (×3): 1 via RESPIRATORY_TRACT
  Filled 2021-12-08: qty 7

## 2021-12-08 MED ORDER — ONDANSETRON HCL 4 MG PO TABS: 4.0000 mg | ORAL_TABLET | Freq: Four times a day (QID) | ORAL | Status: DC | PRN

## 2021-12-08 MED ORDER — SODIUM CHLORIDE 0.9 % IV SOLN: INTRAVENOUS | Status: AC

## 2021-12-08 MED ORDER — INSULIN ASPART 100 UNIT/ML IJ SOLN
0.0000 [IU] | Freq: Three times a day (TID) | INTRAMUSCULAR | Status: DC
  Administered 2021-12-09: 2 [IU] via SUBCUTANEOUS
  Administered 2021-12-09: 3 [IU] via SUBCUTANEOUS
  Administered 2021-12-09: 2 [IU] via SUBCUTANEOUS
  Administered 2021-12-10: 5 [IU] via SUBCUTANEOUS
  Administered 2021-12-10: 2 [IU] via SUBCUTANEOUS
  Administered 2021-12-10 – 2021-12-11 (×2): 5 [IU] via SUBCUTANEOUS
  Administered 2021-12-11: 3 [IU] via SUBCUTANEOUS
  Administered 2021-12-11: 7 [IU] via SUBCUTANEOUS
  Administered 2021-12-12: 9 [IU] via SUBCUTANEOUS
  Administered 2021-12-12: 5 [IU] via SUBCUTANEOUS

## 2021-12-08 MED ORDER — ACETAMINOPHEN 325 MG PO TABS
650.0000 mg | ORAL_TABLET | Freq: Four times a day (QID) | ORAL | Status: DC | PRN
  Administered 2021-12-09: 650 mg via ORAL
  Filled 2021-12-08: qty 2

## 2021-12-08 MED ORDER — ONDANSETRON HCL 4 MG/2ML IJ SOLN: 4.0000 mg | Freq: Four times a day (QID) | INTRAMUSCULAR | Status: DC | PRN

## 2021-12-08 MED ORDER — INSULIN GLARGINE-YFGN 100 UNIT/ML ~~LOC~~ SOLN
16.0000 [IU] | Freq: Every day | SUBCUTANEOUS | Status: DC
  Administered 2021-12-09 – 2021-12-10 (×3): 16 [IU] via SUBCUTANEOUS
  Filled 2021-12-08 (×4): qty 0.16

## 2021-12-08 MED ORDER — PRAVASTATIN SODIUM 10 MG PO TABS
20.0000 mg | ORAL_TABLET | Freq: Every day | ORAL | Status: DC
  Administered 2021-12-09 – 2021-12-12 (×4): 20 mg via ORAL
  Filled 2021-12-08 (×4): qty 2

## 2021-12-08 MED ORDER — ACETAMINOPHEN 650 MG RE SUPP: 650.0000 mg | Freq: Four times a day (QID) | RECTAL | Status: DC | PRN

## 2021-12-08 MED ORDER — BUDESONIDE 0.25 MG/2ML IN SUSP
0.2500 mg | Freq: Two times a day (BID) | RESPIRATORY_TRACT | Status: DC
  Administered 2021-12-09 – 2021-12-11 (×6): 0.25 mg via RESPIRATORY_TRACT
  Filled 2021-12-08 (×7): qty 2

## 2021-12-08 MED ORDER — HEPARIN SODIUM (PORCINE) 5000 UNIT/ML IJ SOLN
5000.0000 [IU] | Freq: Three times a day (TID) | INTRAMUSCULAR | Status: DC
Start: 2021-12-09 — End: 2021-12-12
  Administered 2021-12-09 – 2021-12-12 (×9): 5000 [IU] via SUBCUTANEOUS
  Filled 2021-12-08 (×10): qty 1

## 2021-12-08 MED ORDER — ACETAMINOPHEN 325 MG PO TABS: 650.0000 mg | ORAL_TABLET | Freq: Four times a day (QID) | ORAL | Status: DC | PRN

## 2021-12-08 NOTE — ED Triage Notes (Signed)
Pt BIB [facility] from home due to altered mental status.  Caretaker called because she has been lethargic the last day and a half.  CBG 350.  Pt is normally ambulatory at baseline with walker.  Pt has a ventricular pacemaker.  Caretaker states she is non-compliant with meds.  Pt took her morning and noon insulin.  VS 134/77, SpO2 94%, HR 120.

## 2021-12-08 NOTE — ED Provider Notes (Signed)
[facility] EMERGENCY DEPARTMENT Provider Note   CSN: [id number] Arrival date & time: 12/08/21  1659     History  Chief Complaint  Patient presents with   Altered Mental Status    [name] is a 86 y.o. female.  Presents to ER for altered mental status.  Per EMS report patient has been increasingly lethargic over the last couple days.  Normally ambulatory with walker.  Denied obtained additional history from discussion with her caretaker, nurse.  She states that she has been helping to take care of her over the past few weeks, over the last few days she has noted that patient is very lethargic, generally confused, no other new concerns.  Patient states that she has pain all over, feels generally weak and fatigued.  Patient is poor historian.  HPI     Home Medications Prior to Admission medications   Medication Sig Start Date End Date Taking? Authorizing Provider  acetaminophen (TYLENOL) 325 MG tablet Take 2 tablets (650 mg total) by mouth every 6 (six) hours as needed for mild pain (or Fever >/= 101). 01/23/20   Domenic Polite, MD  aspirin EC 81 MG tablet Take 81 mg by mouth daily. Swallow whole.    [provider]  busPIRone (BUSPAR) 5 MG tablet Take 5 mg by mouth 3 (three) times daily.    [provider]  clopidogrel (PLAVIX) 75 MG tablet Take 1 tablet (75 mg total) by mouth daily. 09/14/21   Shanele Friar, PA-C  fluticasone (FLOVENT HFA) 110 MCG/ACT inhaler Inhale 1 puff into the lungs 2 (two) times daily.    [provider]  glucose blood (ACCU-CHEK AVIVA PLUS) test strip Use to test blood sugar four times daily. Dx: E11.9 03/07/21   Wardell Honour, MD  insulin aspart (NOVOLOG FLEXPEN) 100 UNIT/ML FlexPen Inject 5 Units into the skin 3 (three) times daily with meals. 11/13/21   Linus Galas, MD  insulin degludec (TRESIBA FLEXTOUCH) 200 UNIT/ML FlexTouch Pen Inject 16 Units into the skin at bedtime. 11/13/21    Linus Galas, MD  ipratropium-albuterol (DUONEB) 0.5-2.5 (3) MG/3ML SOLN 1 vial in neb every 8 hours and as needed Patient taking differently: Take 3 mLs by nebulization every 8 (eight) hours as needed (wheezing/shortness of breath). 01/17/21   Baird Lyons D, MD  levalbuterol (XOPENEX HFA) 45 MCG/ACT inhaler INHALE 1-2 PUFFS EVERY 6 HOURS IF NEEDED FOR WHEEZING. Patient taking differently: Inhale 1-2 puffs into the lungs every 6 (six) hours as needed for wheezing. 05/31/21   Baird Lyons D, MD  metoprolol succinate (TOPROL-XL) 50 MG 24 hr tablet TAKE ONE TABLET TWICE A DAY WITH OR IMMEDIATELY FOLLOWING A MEAL. 11/02/21   Wardell Honour, MD  mirabegron ER (MYRBETRIQ) 50 MG TB24 tablet Take 1 tablet (50 mg total) by mouth daily. 09/16/21   Wardell Honour, MD  Multiple Vitamin (MULTIVITAMIN WITH MINERALS) TABS tablet Take 1 tablet by mouth daily. 08/14/21   Pokhrel, Corrie Mckusick, MD  pantoprazole (PROTONIX) 40 MG tablet Take 1 tablet (40 mg total) by mouth daily. 08/13/21 08/13/22  Pokhrel, Corrie Mckusick, MD  pravastatin (PRAVACHOL) 20 MG tablet Take 1 tablet (20 mg total) by mouth daily. 02/28/21   Wardell Honour, MD  thiamine 100 MG tablet Take 1 tablet (100 mg total) by mouth daily. 08/14/21   Pokhrel, Corrie Mckusick, MD  Tiotropium Bromide-Olodaterol (STIOLTO RESPIMAT) 2.5-2.5 MCG/ACT AERS Inhale 2 puffs into the lungs daily. 10/20/21   Deneise Lever, MD  VENTOLIN HFA  108 (90 Base) MCG/ACT inhaler Inhale 2 puffs into the lungs every 6 (six) hours as needed for wheezing or shortness of breath. 08/24/21   [provider]      Allergies    Chlordiazepoxide-clidinium, Biaxin [clarithromycin], Tape, Tramadol, and Codeine    Review of Systems   Review of Systems  Unable to perform ROS: Mental status change    Physical Exam Updated Vital Signs BP (!) 153/85   Pulse (!) 116   Temp 97.9 F (36.6 C) (Rectal)   Resp 16   Ht 5' 3"  (1.6 m)   Wt 56.7 kg   SpO2 100%   BMI 22.14 kg/m  Physical  Exam Vitals and nursing note reviewed.  Constitutional:      Appearance: She is well-developed.     Comments: Alert, mildly confused, answers basic questions but somewhat lethargic  HENT:     Head: Normocephalic and atraumatic.  Eyes:     Conjunctiva/sclera: Conjunctivae normal.  Cardiovascular:     Rate and Rhythm: Regular rhythm. Tachycardia present.     Heart sounds: No murmur heard. Pulmonary:     Effort: Pulmonary effort is normal. No respiratory distress.     Breath sounds: Normal breath sounds.  Abdominal:     Palpations: Abdomen is soft.     Tenderness: There is no abdominal tenderness.  Musculoskeletal:        General: No swelling.     Cervical back: Neck supple.  Skin:    General: Skin is warm and dry.     Capillary Refill: Capillary refill takes less than 2 seconds.  Neurological:     Comments: Alert, oriented to person place and time but generally somewhat lethargic and mildly confused, struggles with complex questioning  Psychiatric:        Mood and Affect: Mood normal.     ED Results / Procedures / Treatments   Labs (all labs ordered are listed, but only abnormal results are displayed) Labs Reviewed  CBC WITH DIFFERENTIAL/PLATELET - Abnormal; Notable for the following components:      Result Value   WBC 12.7 (*)    RBC 6.32 (*)    Hemoglobin 18.6 (*)    HCT 56.8 (*)    Neutro Abs 8.8 (*)    All other components within normal limits  COMPREHENSIVE METABOLIC PANEL - Abnormal; Notable for the following components:   Chloride 92 (*)    Glucose, Bld 221 (*)    BUN 34 (*)    Creatinine, Ser 1.13 (*)    Calcium 10.7 (*)    Total Protein 8.9 (*)    GFR, Estimated 48 (*)    Anion gap 17 (*)    All other components within normal limits  LACTIC ACID, PLASMA - Abnormal; Notable for the following components:   Lactic Acid, Venous 2.5 (*)    All other components within normal limits  URINALYSIS, ROUTINE W REFLEX MICROSCOPIC - Abnormal; Notable for the following  components:   APPearance CLOUDY (*)    Glucose, UA >=500 (*)    Hgb urine dipstick SMALL (*)    Ketones, ur 80 (*)    Protein, ur 30 (*)    Leukocytes,Ua LARGE (*)    WBC, UA >50 (*)    Bacteria, UA FEW (*)    All other components within normal limits  CBG MONITORING, ED - Abnormal; Notable for the following components:   Glucose-Capillary 272 (*)    All other components within normal limits  I-STAT VENOUS  BLOOD GAS, ED - Abnormal; Notable for the following components:   pH, Ven 7.444 (*)    pO2, Ven 28 (*)    Bicarbonate 31.5 (*)    TCO2 33 (*)    Acid-Base Excess 6.0 (*)    Calcium, Ion 1.07 (*)    HCT 59.0 (*)    Hemoglobin 20.1 (*)    All other components within normal limits  CULTURE, BLOOD (ROUTINE X 2)  CULTURE, BLOOD (ROUTINE X 2)  URINE CULTURE  LACTIC ACID, PLASMA  LIPASE, BLOOD    EKG None  Radiology CT Head Wo Contrast  Result Date: 12/08/2021 CLINICAL DATA:  Altered mental status EXAM: CT HEAD WITHOUT CONTRAST TECHNIQUE: Contiguous axial images were obtained from the base of the skull through the vertex without intravenous contrast. RADIATION DOSE REDUCTION: This exam was performed according to the departmental dose-optimization program which includes automated exposure control, adjustment of the mA and/or kV according to patient size and/or use of iterative reconstruction technique. COMPARISON:  11/09/2021 FINDINGS: Brain: No acute intracranial findings are seen. There are no signs of bleeding within the cranium. Cortical sulci are prominent. There is decreased density in periventricular and subcortical white matter. Vascular: Scattered arterial calcifications are seen. Skull: Unremarkable. Sinuses/Orbits: Unremarkable. Other: No significant interval changes are noted. IMPRESSION: No acute intracranial findings are seen in noncontrast CT brain. Atrophy. Small-vessel disease. No significant interval changes are noted. Electronically Signed   By: Elmer Picker  M.D.   On: 12/08/2021 19:19   DG Chest Portable 1 View  Result Date: 12/08/2021 CLINICAL DATA:  Weakness. EXAM: PORTABLE CHEST 1 VIEW COMPARISON:  Chest x-ray dated November 09, 2021. FINDINGS: Unchanged left chest wall pacemaker. The heart size and mediastinal contours are within normal limits. Normal pulmonary vascularity. No focal consolidation, pleural effusion, or pneumothorax. No acute osseous abnormality. IMPRESSION: 1. No active disease. Electronically Signed   By: Titus Dubin M.D.   On: 12/08/2021 18:02    Procedures Procedures    Medications Ordered in ED Medications  cefTRIAXone (ROCEPHIN) 1 g in sodium chloride 0.9 % 100 mL IVPB (has no administration in time range)  sodium chloride 0.9 % bolus 500 mL (has no administration in time range)  sodium chloride 0.9 % bolus 500 mL (0 mLs Intravenous Stopped 12/08/21 2156)    ED Course/ Medical Decision Making/ A&P                           Medical Decision Making Amount and/or Complexity of Data Reviewed Labs: ordered. Radiology: ordered.  Risk Decision regarding hospitalization.   86 year old lady presenting to ER due to concern for altered mental status.  Patient was very poor historian though she was alert and able to answer some basic questions.  Most of history was obtained from her caregiver via phone who reports that patient had significant general confusion and lethargy over the past couple days.  Patient noted to be somewhat tachycardic, blood pressure stable, afebrile.  Lab work reviewed, creatinine and BUN elevated from baseline.  Suspect AKI, dehydration.  Provided some IVF.  Her urinalysis is concerning for urinary tract infection.  Will start on ceftriaxone.  CXR without pneumonia, CT head without acute findings.  I independently reviewed and interpreted CXR and CT head.  Agree with radiology report.  Believe patient would benefit from admission for further management given her lethargy and changes in mental status,  rehydration and antibiotics for suspected UTI.  Discussed with admitting hospitalist  Marcello Moores who will accept. document critical care time when appropriate:1}      Final Clinical Impression(s) / ED Diagnoses Final diagnoses:  Altered mental status, unspecified altered mental status type  Acute kidney injury [facility])    Rx / DC Orders ED Discharge Orders     None         Lucrezia Starch, MD 12/08/21 2207

## 2021-12-08 NOTE — ED Notes (Signed)
Nurse Jeannene Patella [phone] would like an update asap and to be called if she's being discharged so she can pick her up

## 2021-12-08 NOTE — ED Notes (Signed)
Patient provided with perineal care, full linen change, purewick change, new brief, and warm blankets. Patient is resting comfortably with RR even and regular, denying pain at this time

## 2021-12-08 NOTE — ED Notes (Signed)
Received verbal report from [place] at this time

## 2021-12-08 NOTE — Telephone Encounter (Signed)
Home health aid called stating that patient is very lethargic, not eating, urinating a lot and very thirsty. She states she checked patient's blood glucose and it was 609. She gave patient 14 units of novolog.    I consulted verbally with Dinah Ngetich,NP and she stated to take patient to ER. Aid verbalized her understanding and agreed.  Message routed to PCP Dr. Lillette Boxer. Sabra Heck, MD ([place])

## 2021-12-08 NOTE — H&P (Addendum)
History and Physical    [name] UEK:[id number] DOB: July 30, 1935 DOA: 12/08/2021  PCP: Wardell Honour, MD  Patient coming from: home  I have personally briefly reviewed patient's old medical records in [place]  Chief Complaint: generalized weakness, lethargy confusion  HPI: [name] is a 86 y.o. female with medical history significant of uncontrolled type 2 diabetes, COPD on home oxygen, fatty liver disease, HLD, HTN,  CVA , GERD, dementia and complete heart block s/p pacemaker who presented to the ED with care give due to increase generalized weakness,lethargy and confusion from baseline. Please note no further history  is able to be obtained due to patient being a poor historian. ON ROS she notes some suprapubic tenderness but denies n/v/diarrhea , sob or chest pain.    ED Course:  Vitals: afeb, bp 129/87, hr 119, rr 25 sat 94% on 4L Ekg ventricular paced  Wbc 12.7, hgb 18.6, plt 335 NA 136, k 4.2, cr 1.13 ( 0.58)  AG 17 Lipase 34 Vbg: 7.44/ 45 Lactic 1.9 UA:+ ketones, larke LE wbc >50 + bacteria  CTH: No acute intracranial findings are seen in noncontrast CT brain. Atrophy. Small-vessel disease. No significant interval changes are  Tx: CTX , ns 500cc  Review of Systems: As per HPI otherwise 10 point review of systems negative.   Past Medical History:  Diagnosis Date   Acute ischemic stroke ([place]) 08/10/2021   Anxiety disorder, unspecified    Arthritis    Benign paroxysmal positional vertigo 08/09/2012   Chronic rhinitis    Cognitive communication deficit    Constipation, unspecified    COPD mixed type ([place])    on home O2   Diverticulosis    Esophageal stricture    Essential (primary) hypertension    Gastro-esophageal reflux disease without esophagitis    Glaucoma    Hiatal hernia    Hyperlipidemia, unspecified    Irritable bowel syndrome    Ischemic stroke ([place]) 08/09/2021   Presence of permanent cardiac pacemaker 01/14/2020   Spinal  stenosis, lumbar region, without neurogenic claudication    Steatohepatitis    Thrombocytopenia, unspecified ([place])    Type 2 diabetes mellitus with diabetic polyneuropathy ([place])     Past Surgical History:  Procedure Laterality Date   APPENDECTOMY     CARPAL TUNNEL RELEASE     right hand   CHOLECYSTECTOMY OPEN  1978   COLONOSCOPY  07-2001   mild diverticulosis   ESOPHAGOGASTRODUODENOSCOPY  1505,69-79   H Hernia,es.stricture s/p dil 49F   INTRAOCULAR LENS INSERTION Bilateral    LIVER BIOPSY  08-8014   PACEMAKER IMPLANT N/A 01/14/2020   Procedure: PACEMAKER IMPLANT;  Surgeon: Evans Lance, MD;  Location: [facility];  Service: Cardiovascular;  Laterality: N/A;   PARATHYROID EXPLORATION     TONSILLECTOMY AND ADENOIDECTOMY     TOTAL ABDOMINAL HYSTERECTOMY     ULNAR NERVE TRANSPOSITION  12/07/2011   Procedure: ULNAR NERVE DECOMPRESSION/TRANSPOSITION;this was cancelled-not done  Surgeon: Cammie Sickle., MD;  Location: [facility];  Service: Orthopedics;  Laterality: Right;  right ulnar nerve in situ decompression   ULNAR TUNNEL RELEASE  03/07/2012   Procedure: CUBITAL TUNNEL RELEASE;  Surgeon: Roseanne Kaufman, MD;  Location: [facility];  Service: Orthopedics;  Laterality: Right;  ulnar nerve release at the elbow       reports that she quit smoking about 33 years ago. Her smoking use included cigarettes. She has never used smokeless tobacco. She reports  that she does not drink alcohol and does not use drugs.  Allergies  Allergen Reactions   Chlordiazepoxide-Clidinium Other (See Comments)    Sleepy,weak   Biaxin [Clarithromycin] Other (See Comments)    Foul taste, abd pain, diarrhea   Tape Other (See Comments)    SKIN IS VERY THIN AND WILL TEAR AND BRUISE EASILY!!   Tramadol Other (See Comments)    Caused tremors   Codeine Other (See Comments)    Upset the stomach    Family History  Problem Relation Age of Onset   Heart disease Father     Lung cancer Mother        small cell;Byssinosis   Lung cancer Sister    Liver cancer Sister        ? mets from another area of the body   Diabetes Other        grandmother   Stroke Maternal Grandfather     Prior to Admission medications   Medication Sig Start Date End Date Taking? Authorizing Provider  acetaminophen (TYLENOL) 325 MG tablet Take 2 tablets (650 mg total) by mouth every 6 (six) hours as needed for mild pain (or Fever >/= 101). 01/23/20   Domenic Polite, MD  aspirin EC 81 MG tablet Take 81 mg by mouth daily. Swallow whole.    [provider]  busPIRone (BUSPAR) 5 MG tablet Take 5 mg by mouth 3 (three) times daily.    [provider]  clopidogrel (PLAVIX) 75 MG tablet Take 1 tablet (75 mg total) by mouth daily. 09/14/21   Miliyah Friar, PA-C  fluticasone (FLOVENT HFA) 110 MCG/ACT inhaler Inhale 1 puff into the lungs 2 (two) times daily.    [provider]  glucose blood (ACCU-CHEK AVIVA PLUS) test strip Use to test blood sugar four times daily. Dx: E11.9 03/07/21   Wardell Honour, MD  insulin aspart (NOVOLOG FLEXPEN) 100 UNIT/ML FlexPen Inject 5 Units into the skin 3 (three) times daily with meals. 11/13/21   Linus Galas, MD  insulin degludec (TRESIBA FLEXTOUCH) 200 UNIT/ML FlexTouch Pen Inject 16 Units into the skin at bedtime. 11/13/21   Linus Galas, MD  ipratropium-albuterol (DUONEB) 0.5-2.5 (3) MG/3ML SOLN 1 vial in neb every 8 hours and as needed Patient taking differently: Take 3 mLs by nebulization every 8 (eight) hours as needed (wheezing/shortness of breath). 01/17/21   Baird Lyons D, MD  levalbuterol (XOPENEX HFA) 45 MCG/ACT inhaler INHALE 1-2 PUFFS EVERY 6 HOURS IF NEEDED FOR WHEEZING. Patient taking differently: Inhale 1-2 puffs into the lungs every 6 (six) hours as needed for wheezing. 05/31/21   Baird Lyons D, MD  metoprolol succinate (TOPROL-XL) 50 MG 24 hr tablet TAKE ONE TABLET TWICE A DAY WITH OR  IMMEDIATELY FOLLOWING A MEAL. 11/02/21   Wardell Honour, MD  mirabegron ER (MYRBETRIQ) 50 MG TB24 tablet Take 1 tablet (50 mg total) by mouth daily. 09/16/21   Wardell Honour, MD  Multiple Vitamin (MULTIVITAMIN WITH MINERALS) TABS tablet Take 1 tablet by mouth daily. 08/14/21   Pokhrel, Corrie Mckusick, MD  pantoprazole (PROTONIX) 40 MG tablet Take 1 tablet (40 mg total) by mouth daily. 08/13/21 08/13/22  Pokhrel, Corrie Mckusick, MD  pravastatin (PRAVACHOL) 20 MG tablet Take 1 tablet (20 mg total) by mouth daily. 02/28/21   Wardell Honour, MD  thiamine 100 MG tablet Take 1 tablet (100 mg total) by mouth daily. 08/14/21   Pokhrel, Corrie Mckusick, MD  Tiotropium Bromide-Olodaterol (STIOLTO RESPIMAT) 2.5-2.5 MCG/ACT AERS Inhale 2 puffs into  the lungs daily. 10/20/21   Deneise Lever, MD  VENTOLIN HFA 108 (90 Base) MCG/ACT inhaler Inhale 2 puffs into the lungs every 6 (six) hours as needed for wheezing or shortness of breath. 08/24/21   [provider]    Physical Exam: Vitals:   12/08/21 2100 12/08/21 2115 12/08/21 2130 12/08/21 2215  BP: (!) 160/96 (!) 157/83 (!) 153/85 (!) 148/88  Pulse:    (!) 110  Resp: (!) 24 (!) 21 16 (!) 22  Temp:   98.1 F (36.7 C)   TempSrc:   Axillary   SpO2:    100%  Weight:      Height:        Vitals:   12/08/21 2100 12/08/21 2115 12/08/21 2130 12/08/21 2215  BP: (!) 160/96 (!) 157/83 (!) 153/85 (!) 148/88  Pulse:    (!) 110  Resp: (!) 24 (!) 21 16 (!) 22  Temp:   98.1 F (36.7 C)   TempSrc:   Axillary   SpO2:    100%  Weight:      Height:       Constitutional: NAD, calm, comfortable Eyes: PERRL, lids and conjunctivae normal ENMT: Mucous membranes are moist. Posterior pharynx clear of any exudate or lesions.Normal dentition.  Neck: normal, supple, no masses, no thyromegaly Respiratory: clear to auscultation bilaterally, no wheezing, no crackles. Normal respiratory effort. No accessory muscle use.  Cardiovascular: Regular rate and rhythm, no murmurs / rubs / gallops.  No extremity edema. 2+ pedal pulses. No carotid bruits.  Abdomen: no tenderness, no masses palpated. No hepatosplenomegaly. Bowel sounds positive.  Musculoskeletal: no clubbing / cyanosis. No joint deformity upper and lower extremities. Good ROM, no contractures. Normal muscle tone.  Skin: no rashes, lesions, ulcers. No induration Neurologic: CN 2-12 grossly intact. Sensation intact, DTR normal. Strength 5/5 in all 4.  Psychiatric: pleasantly confused, alert to self only   Labs on Admission: I have personally reviewed following labs and imaging studies  CBC: Recent Labs  Lab 12/08/21 1743 12/08/21 1753  WBC 12.7*  --   NEUTROABS 8.8*  --   HGB 18.6* 20.1*  HCT 56.8* 59.0*  MCV 89.9  --   PLT 335  --    Basic Metabolic Panel: Recent Labs  Lab 12/08/21 1743 12/08/21 1753  NA 136 136  K 4.2 4.3  CL 92*  --   CO2 27  --   GLUCOSE 221*  --   BUN 34*  --   CREATININE 1.13*  --   CALCIUM 10.7*  --    GFR: Estimated Creatinine Clearance: 30.7 mL/min (A) (by C-G formula based on SCr of 1.13 mg/dL (H)). Liver Function Tests: Recent Labs  Lab 12/08/21 1743  AST 17  ALT 14  ALKPHOS 79  BILITOT 0.9  PROT 8.9*  ALBUMIN 4.6   Recent Labs  Lab 12/08/21 1743  LIPASE 34   No results for input(s): "AMMONIA" in the last 168 hours. Coagulation Profile: No results for input(s): "INR", "PROTIME" in the last 168 hours. Cardiac Enzymes: No results for input(s): "CKTOTAL", "CKMB", "CKMBINDEX", "TROPONINI" in the last 168 hours. BNP (last 3 results) No results for input(s): "PROBNP" in the last 8760 hours. HbA1C: No results for input(s): "HGBA1C" in the last 72 hours. CBG: Recent Labs  Lab 12/08/21 1659  GLUCAP 272*   Lipid Profile: No results for input(s): "CHOL", "HDL", "LDLCALC", "TRIG", "CHOLHDL", "LDLDIRECT" in the last 72 hours. Thyroid Function Tests: No results for input(s): "TSH", "T4TOTAL", "FREET4", "T3FREE", "THYROIDAB"  in the last 72 hours. Anemia Panel: No  results for input(s): "VITAMINB12", "FOLATE", "FERRITIN", "TIBC", "IRON", "RETICCTPCT" in the last 72 hours. Urine analysis:    Component Value Date/Time   COLORURINE YELLOW 12/08/2021 2055   APPEARANCEUR CLOUDY (A) 12/08/2021 2055   LABSPEC 1.020 12/08/2021 2055   PHURINE 5.0 12/08/2021 2055   GLUCOSEU >=500 (A) 12/08/2021 2055   GLUCOSEU 100 (A) 05/11/2017 1015   HGBUR SMALL (A) 12/08/2021 2055   BILIRUBINUR NEGATIVE 12/08/2021 2055   BILIRUBINUR Negative 11/25/2021 1415   KETONESUR 80 (A) 12/08/2021 2055   PROTEINUR 30 (A) 12/08/2021 2055   UROBILINOGEN 0.2 11/25/2021 1415   UROBILINOGEN 0.2 05/11/2017 1015   NITRITE NEGATIVE 12/08/2021 2055   LEUKOCYTESUR LARGE (A) 12/08/2021 2055    Radiological Exams on Admission: CT Head Wo Contrast  Result Date: 12/08/2021 CLINICAL DATA:  Altered mental status EXAM: CT HEAD WITHOUT CONTRAST TECHNIQUE: Contiguous axial images were obtained from the base of the skull through the vertex without intravenous contrast. RADIATION DOSE REDUCTION: This exam was performed according to the departmental dose-optimization program which includes automated exposure control, adjustment of the mA and/or kV according to patient size and/or use of iterative reconstruction technique. COMPARISON:  11/09/2021 FINDINGS: Brain: No acute intracranial findings are seen. There are no signs of bleeding within the cranium. Cortical sulci are prominent. There is decreased density in periventricular and subcortical white matter. Vascular: Scattered arterial calcifications are seen. Skull: Unremarkable. Sinuses/Orbits: Unremarkable. Other: No significant interval changes are noted. IMPRESSION: No acute intracranial findings are seen in noncontrast CT brain. Atrophy. Small-vessel disease. No significant interval changes are noted. Electronically Signed   By: Elmer Picker M.D.   On: 12/08/2021 19:19   DG Chest Portable 1 View  Result Date: 12/08/2021 CLINICAL DATA:  Weakness.  EXAM: PORTABLE CHEST 1 VIEW COMPARISON:  Chest x-ray dated November 09, 2021. FINDINGS: Unchanged left chest wall pacemaker. The heart size and mediastinal contours are within normal limits. Normal pulmonary vascularity. No focal consolidation, pleural effusion, or pneumothorax. No acute osseous abnormality. IMPRESSION: 1. No active disease. Electronically Signed   By: Titus Dubin M.D.   On: 12/08/2021 18:02    EKG: Independently reviewed. See above  Assessment/Plan  UTI /sepsis - start CTX -f/u on culture data    AKI -due to low volume  -ivfs  -hold nephrotoxic medication   Metabolic Encephalopathy  -due to UTI/AKI -neuro nonfocal  -CTH negative  -tx underlying cause   DMII -resume home insulin  -place  on  poc fs, iss  COPD  -on homeO2 -no acute exacerbation  -resume controller medication   HLD Fatty liver -continue statin   CVA April 2023 -continue secondary ppx with plavix /asa  Complete heart block  -s/p pacer    HTN  -controlled -resume metoprolol  GERD -continue ppi   Anxiety  -continue buspar  FEN  Electrolytes stable replete prn   DVT prophylaxis:  heparin Code Status: full Family Communication:  None at bedside Disposition Plan:  patient  expected to be admitted greater than 2 midnights  Consults called:n/a Admission status:  med tele  Clance Boll MD [facility]   If 7PM-7AM, please contact night-coverage [email or web address] Password [email or web address]  12/08/2021, 11:15 PM

## 2021-12-09 ENCOUNTER — Other Ambulatory Visit: Payer: Self-pay | Admitting: *Deleted

## 2021-12-09 DIAGNOSIS — D751 Secondary polycythemia: Secondary | ICD-10-CM

## 2021-12-09 DIAGNOSIS — N179 Acute kidney failure, unspecified: Secondary | ICD-10-CM

## 2021-12-09 DIAGNOSIS — N3000 Acute cystitis without hematuria: Secondary | ICD-10-CM

## 2021-12-09 DIAGNOSIS — F0390 Unspecified dementia without behavioral disturbance: Secondary | ICD-10-CM

## 2021-12-09 LAB — COMPREHENSIVE METABOLIC PANEL
ALT: 10 U/L (ref 0–44)
AST: 15 U/L (ref 15–41)
Albumin: 3.5 g/dL (ref 3.5–5.0)
Alkaline Phosphatase: 61 U/L (ref 38–126)
Anion gap: 11 (ref 5–15)
BUN: 30 mg/dL — ABNORMAL HIGH (ref 8–23)
CO2: 28 mmol/L (ref 22–32)
Calcium: 9.6 mg/dL (ref 8.9–10.3)
Chloride: 100 mmol/L (ref 98–111)
Creatinine, Ser: 0.83 mg/dL (ref 0.44–1.00)
GFR, Estimated: >60 mL/min (ref >60)
Glucose, Bld: 113 mg/dL — ABNORMAL HIGH (ref 70–99)
Potassium: 3.9 mmol/L (ref 3.5–5.1)
Sodium: 139 mmol/L (ref 135–145)
Total Bilirubin: 0.7 mg/dL (ref 0.3–1.2)
Total Protein: 7.1 g/dL (ref 6.5–8.1)

## 2021-12-09 LAB — CBC
HCT: 52.1 % — ABNORMAL HIGH (ref 36.0–46.0)
Hemoglobin: 17.1 g/dL — ABNORMAL HIGH (ref 12.0–15.0)
MCH: 29.5 pg (ref 26.0–34.0)
MCHC: 32.8 g/dL (ref 30.0–36.0)
MCV: 90 fL (ref 80.0–100.0)
Platelets: 233 10*3/uL (ref 150–400)
RBC: 5.79 MIL/uL — ABNORMAL HIGH (ref 3.87–5.11)
RDW: 14.9 % (ref 11.5–15.5)
WBC: 12.2 10*3/uL — ABNORMAL HIGH (ref 4.0–10.5)
nRBC: 0 % (ref 0.0–0.2)

## 2021-12-09 LAB — GLUCOSE, CAPILLARY
Glucose-Capillary: 172 mg/dL — ABNORMAL HIGH (ref 70–99)
Glucose-Capillary: 200 mg/dL — ABNORMAL HIGH (ref 70–99)
Glucose-Capillary: 221 mg/dL — ABNORMAL HIGH (ref 70–99)
Glucose-Capillary: 229 mg/dL — ABNORMAL HIGH (ref 70–99)

## 2021-12-09 MED ORDER — ADULT MULTIVITAMIN W/MINERALS CH
1.0000 | ORAL_TABLET | Freq: Every day | ORAL | Status: DC
  Administered 2021-12-09 – 2021-12-12 (×4): 1 via ORAL
  Filled 2021-12-09 (×4): qty 1

## 2021-12-09 MED ORDER — ENSURE ENLIVE PO LIQD
237.0000 mL | Freq: Two times a day (BID) | ORAL | Status: DC
  Administered 2021-12-09 – 2021-12-12 (×6): 237 mL via ORAL

## 2021-12-09 MED ORDER — IRBESARTAN 75 MG PO TABS
75.0000 mg | ORAL_TABLET | Freq: Every day | ORAL | Status: DC
  Administered 2021-12-09 – 2021-12-12 (×4): 75 mg via ORAL
  Filled 2021-12-09 (×4): qty 1

## 2021-12-09 MED ORDER — LIP MEDEX EX OINT
TOPICAL_OINTMENT | CUTANEOUS | Status: DC | PRN
  Administered 2021-12-09: 1 via TOPICAL
  Filled 2021-12-09: qty 7

## 2021-12-09 MED ORDER — SODIUM CHLORIDE 0.9 % IV SOLN
1.0000 g | INTRAVENOUS | Status: DC
  Filled 2021-12-09: qty 10

## 2021-12-09 NOTE — TOC Initial Note (Addendum)
Transition of Care [facility]) - Initial/Assessment Note    Patient Details  Name: [name] MRN: [id number] Date of Birth: Oct 07, 1935  [facility]) CM/SW Contact:    Marilu Favre, RN Phone Number: 12/09/2021, 11:34 AM  Clinical Narrative:                  Per chart review patient from home alone with [facility] and caregiver through [facility] program.   Talked to patient at bedside and confirmed above. Patient unsure of name of aide agency and name of home health agency.   Patient states she has a caregiver 5 days a week from 9 am to 3 pm. She has home oxygen but unsure name of agency.   NCM called Caryl Pina with [facility] and confirmed patient is active with them for [facility]. Patient has cancelled several [facility] visits  and refused visit from [facility] . Also [facility] has note that CAP[facility] case [place] was planning to have a visit with patient regarding placement. Per note patient has "gone through multiple aide agencies due not not being satisfied". Also [facility] stated she has a brother "out [place]".   NCM left message with [facility]   Nurse provided caregivers name and contact [name] [phone] .   NCM spoke to Ledon Snare is  caregiver through [facility]. Patient lives alone Gerald Stabs is a friend who lives away. [name] is with patient 5 days a week from 9 am to 3pm and goes back and checks on her. Patient has walker and cane. Patient also has oxygen , [name] thinks through [place]. At discharge Jeannene Patella is planning on transporting patient home , she will need portable oxygen tank. NCM confirmed with [facility] that patient is active with them for oxygen.    Per attending patient has  capacity   Barriers to Discharge: Continued Medical Work up   Patient Goals and CMS Choice Patient states their goals for this hospitalization and ongoing recovery are:: to go home CMS Medicare.gov Compare Post Acute Care list provided to:: Patient Choice offered to / list presented to :  Patient  Expected Discharge Plan and Services     Discharge Planning Services: CM Consult                                          Prior Living Arrangements/Services     Patient language and need for interpreter reviewed:: Yes              Criminal Activity/Legal Involvement Pertinent to Current Situation/Hospitalization: No - Comment as needed  Activities of Daily Living Home Assistive Devices/Equipment: Eyeglasses, Oxygen, Walker (specify type), Cane (specify quad or straight)  Permission Sought/Granted   Permission granted to share information with : Yes, Verbal Permission Granted  Share Information with NAME: [name] caregiver [phone]  Permission granted to share info w AGENCY: [facility]        Emotional Assessment       Orientation: : Oriented to Self, Oriented to Place, Oriented to  Time Alcohol / Substance Use: Not Applicable Psych Involvement: No (comment)  Admission diagnosis:  UTI (urinary tract infection) [N39.0] Acute kidney injury ([place]) [N17.9] Altered mental status, unspecified altered mental status type [R41.82] Patient Active Problem List   Diagnosis Date Noted   UTI (urinary tract infection) 12/08/2021   Fall    Stricture and stenosis of esophagus  Abnormal esophagram    Dysphagia    Protein-calorie malnutrition, severe 10/03/2021   DKA (diabetic ketoacidosis) ([place]) 10/02/2021   AKI (acute kidney injury) ([place]) 10/02/2021   Acute cystitis 10/02/2021   Diabetic foot ulcers ([place]) - right foot 10/02/2021   Sepsis with acute renal failure without septic shock ([place]) 10/02/2021   Seborrheic dermatitis 10/12/2020   Pain due to onychomycosis of toenails of both feet 09/01/2020   Sjogren's syndrome with keratoconjunctivitis sicca ([place]) 06/07/2020   Mild cognitive impairment with memory loss 01/29/2020   Full code status 01/26/2020   Pacemaker 01/19/2020   Pressure injury of skin 01/14/2020   Complete heart block (H[facility])    Acute  metabolic encephalopathy    Acute on chronic respiratory failure with hypoxia ([place])    Scoliosis due to degenerative disease of spine in adult patient 09/10/2018   Radicular pain of left lower extremity 09/10/2018   Hand arthritis 10/11/2017   Type 2 diabetes mellitus ([place]) 03/08/2017   Diarrhea in adult patient 11/08/2016   Thrombocytopenia ([place]) 11/08/2016   Degenerative lumbar spinal stenosis 10/27/2015   Arthralgia 09/22/2015   Chronic respiratory failure with hypoxia ([place]) 01/11/2015   Diabetic polyneuropathy associated with type 2 diabetes mellitus ([place]) 11/16/2014   Hypertension 05/19/2013   Vertigo 05/11/2013   Dizziness 05/11/2013   Hyperlipidemia 02/15/2013   Type II diabetes mellitus, uncontrolled 01/01/2013   Benign paroxysmal positional vertigo 08/09/2012   Type II or unspecified type diabetes mellitus with neurological manifestations, uncontrolled(250.62) 08/09/2012   GLAUCOMA 03/16/2010   HOARSENESS 03/16/2010   COUGH 12/23/2009   MUSCULOSKELETAL PAIN 01/28/2009   COPD mixed type ([place]) 01/15/2009   Seasonal and perennial allergic rhinitis 11/23/2007   CHRONIC RHINITIS 07/16/2007   GERD (gastroesophageal reflux disease) 05/29/2007   I B S-DIARRHEAL PREDOMINATE 05/29/2007   FATTY LIVER DISEASE 05/29/2007   PCP:  Wardell Honour, MD Pharmacy:   [place], [place] [address] Phone: [phone] Fax: [facility] [phone] - [place], [place] [address] [address] Phone: [phone] Fax: [place], [place] [address] Melina Modena [address] Janann [place] [address] Phone: [phone] Fax: [phone]     Social Determinants of Health (SDOH) Interventions    Readmission Risk Interventions     No data to display

## 2021-12-09 NOTE — Patient Outreach (Signed)
[facility]) Care Management  12/09/2021  [name] 1936/03/20 [id number]   Hand coordination- inpatient  Consult with [facility] hospital liaison and hospital RN Jani Gravel related to concerns for pt level of care. Shared concerns with poor diabetes & medication management, poor support system and previously leaving prior facility to return home to her cats.  Plan [facility] RN CM will continue to collaborate with inpatient staff and [facility] hospital liaison prn   Rishith Siddoway L. Lavina Hamman, RN, BSN, [place] Coordinator Office number [phone]

## 2021-12-09 NOTE — Inpatient Diabetes Management (Addendum)
Inpatient Diabetes Program Recommendations  AACE/ADA: New Consensus Statement on Inpatient Glycemic Control (2015)  Target Ranges:  Prepandial:   less than 140 mg/dL      Peak postprandial:   less than 180 mg/dL (1-2 hours)      Critically ill patients:  140 - 180 mg/dL   Lab Results  Component Value Date   GLUCAP 172 (H) 12/09/2021   HGBA1C >15.5 (H) 10/03/2021    Review of Glycemic Control  Latest Reference Range & Units 12/08/21 16:59 12/09/21 07:47  Glucose-Capillary 70 - 99 mg/dL 272 (H) 172 (H)  (H): Data is abnormally high  Latest Reference Range & Units 10/03/21 07:31  Hemoglobin A1C 4.8 - 5.6 % >15.5 (H)  (H): Data is abnormally high  Diabetes history: DM2 Outpatient Diabetes medications: Tresiba and Novolog  Current orders for Inpatient glycemic control: Semglee 16 units QHS, Novolog 0-9 units TID  Patient has had 4 admissions in the last 6 months including 1 30-day readmission.    She is well known to the diabetes team.  She does not have any family or friends.  She states, "it's just me and my cat".  During her last admission it was recommended she go to SNF but she refused because of her cat.    Spoke with patient at bedside.  She cannot tell me which insulins she takes nor how much.  She states she takes her insulins and does not miss doses.  This is clearly questionable given her A1C of 15% and cannot remember names or doses.    She is talking about a movie that she is watching on the T.V. and how she has seen this musical/movie before.  The show that is playing is not a musical/movie.  When asked how she get her food, she states, "I must get help from the government".  States her medications are delivered to her.    She states ever since her stroke a few months ago she forgets things often.   Spoke with Natividad Brood, [facility] coordinator to ensure Ms. Rathje is on their list.  Also spoke with RN and she says that the patient fires all of her [facility] assistance per TOC.     I worry for her safety at home alone and wonder if she should be evaluated for competency/capacity ?    Will continue to follow while inpatient.  Thank you, Reche Dixon, MSN, [place] [facility] Coordinator Inpatient Diabetes Program [phone] (team pager from 8a-5p)

## 2021-12-09 NOTE — Consult Note (Signed)
   [facility] Inpatient Consult   12/09/2021  [name] [birth year] [id number]  [place] Organization [ACO] Patient: [name]  Primary Care Provider:  Wardell Honour, MD, [facility]  Received secure chat from Diabetes Coordinator and corresponding with [facility] RNCM as well  Patient for less than 30 days readmission screen for prevention assessment and needs. 1:15 pm Came by to see patient and she is eating lunch states, "I took 5 bites of the meat but I'm not a big eater anyway." Explained reason for the visit and for post hospital follow up needs.  She states, "It seems to me that the doctors should know how to treat my cystis by now, I keep coming in with the same problem and they don't seem to know how to treat it." Explained that her diabetes is of concern. Patient is currently active with [facility] for chronic disease management services.  Patient has been engaged by a [facility].  Our community based plan of care has focused on disease management and community resource support.    Chart review with for ongoing follow up needs as patient's diabetes management is ongoing need for follow up.  Plan: Following for disposition needs   Of note, [facility] Care Management services does not replace or interfere with any services that are needed or arranged by inpatient [facility] care management team.  For additional questions or referrals please contact:  Natividad Brood, RN BSN [facility] Liaison  [phone] business mobile phone Toll free office [phone]  Fax number: [phone] [email or web address]

## 2021-12-09 NOTE — Plan of Care (Signed)
  Problem: Health Behavior/Discharge Planning: Goal: Ability to manage health-related needs will improve Outcome: Progressing   

## 2021-12-09 NOTE — Progress Notes (Signed)
Initial Nutrition Assessment  DOCUMENTATION CODES:  Severe malnutrition in context of chronic illness  INTERVENTION:  Liberalize diet to carb modified, change to automatic trays Ensure Enlive po BID, each supplement provides 350 kcal and 20 grams of protein. MVI with minerals daily  NUTRITION DIAGNOSIS:  Severe Malnutrition (in the context of chronic illness) related to  (inadequate oral intake) as evidenced by severe fat depletion, severe muscle depletion.  GOAL:  Patient will meet greater than or equal to 90% of their needs  MONITOR:  PO intake, Supplement acceptance, Weight trends  REASON FOR ASSESSMENT:   Malnutrition Screening Tool    ASSESSMENT:  Pt with hx of DM type 2, COPD, fatty liver, HTN, HLD, dementia, GERD, and hx CVA brought to ED by her caretaker for AMS and elevated glucose.  Pt resting in bed at the time of assessment. Pt reports that she did not eat breakfast this AM but also can't recall if she received food or not. States she is hungry and looking forward to lunch. Pt reports that she likes the ensure she is receiving.   Pt having difficulty finding her words. States she used to weight >200lbs but unsure what time frame this occurred over.   Discussed intake with RN, states that pt had a few bites of breakfast but that her food was cold and she didn't want much. Liberalized diet and will add MVI.  Average Meal Intake: 8/4: 50% intake x 1 recorded meals  Nutritionally Relevant Medications: Scheduled Meds:  Ensure Enlive  237 mL Oral BID BM   insulin aspart  0-9 Units Subcutaneous TID WC   insulin glargine-yfgn  16 Units Subcutaneous QHS   pantoprazole  40 mg Oral Daily   pravastatin  20 mg Oral Daily   Continuous Infusions:  sodium chloride 75 mL/hr at 12/09/21 0205   Labs Reviewed  NUTRITION - FOCUSED PHYSICAL EXAM: Flowsheet Row Most Recent Value  Orbital Region Severe depletion  Upper Arm Region Severe depletion  Thoracic and Lumbar Region  Moderate depletion  Buccal Region Severe depletion  Temple Region Severe depletion  Clavicle Bone Region Severe depletion  Clavicle and Acromion Bone Region Severe depletion  Scapular Bone Region Severe depletion  Dorsal Hand Moderate depletion  Patellar Region Severe depletion  Anterior Thigh Region Severe depletion  Posterior Calf Region Severe depletion  Edema (RD Assessment) None  Hair Reviewed  Eyes Reviewed  Mouth Reviewed  Skin Reviewed  Nails Reviewed    Diet Order:   Diet Order             Diet Carb Modified Fluid consistency: Thin; Room service appropriate? No  Diet effective now                   EDUCATION NEEDS:  Not appropriate for education at this time  Skin:  Skin Assessment: Reviewed RN Assessment  Last BM:  unsure  Height:  Ht Readings from Last 1 Encounters:  12/09/21 5' 3"  (1.6 m)    Weight:  Wt Readings from Last 1 Encounters:  12/09/21 50 kg    Ideal Body Weight:  52.3 kg  BMI:  Body mass index is 19.53 kg/m.  Estimated Nutritional Needs:  Kcal:  1400-1600 kcal/d Protein:  70-80 g/d Fluid:  1.5-1.8 L/d   Ranell Patrick, RD, LDN Clinical Dietitian RD pager # available in [facility]  After hours/weekend pager # available in [facility]

## 2021-12-09 NOTE — Progress Notes (Signed)
PROGRESS NOTE    [name]  YQM:[id number] DOB: 13-Sep-1935 DOA: 12/08/2021 PCP: Wardell Honour, MD   Brief Narrative: [name] is a 86 y.o. female with a history of diabetes mellitus type 2, COPD, chronic respiratory failure, fatty liver disease, hyperlipidemia, CVA, hypertension, GERD, dementia, complete heart block s/p pacemaker. Patient presented secondary to generalized weakness and confusion. Patient found to have evidence of a UTI and started on empiric antibiotics.   Assessment and Plan:  Sepsis secondary to UTI Present on admission. Leukocytosis and tachypnea noted. Urinalysis consistent with possible infection. Urine and blood cultures obtained on admission. Patient started empirically on Ceftriaxone IV.  AKI Baseline creatinine of 0.5. Creatinine of 1.13 on admission which has improved with IV fluids. AKI resolved.  Diabetes mellitus, type 2 Most recent hemoglobin A1C of >15.5%. Patient is on insulin therapy as an outpatient but is reportedly nonadherent with regimen. -Continue Semglee 16 units qHS and SSI  Dementia Patient with intermittent confusion issues. Currently lives at home alone. Concerns for patient's ability to care for herself has been noted. Patient has a history of capacity evaluations from Psychiatry and at that time had capacity to make decisions. She appears to still have capacity, although some poor insight regarding management of her medications.  Polycythemia Mild. Possibly secondary to hypovolemia/dehydration. -CBC in AM  COPD -Continue Pulmicort, Brovana, Incruse Ellipta and albuterol  Hyperlipidemia -Continue pravastatin  History of CVA Patient is listed as taking aspirin, Plavix and pravastatin. Unsure if she is still meant to be on dual antiplatelet medication. -Continue Aspirin and Pravastatin  Complete heart block S/p pacemaker  Primary hypertension Patient is on irbesartan and Toprol XL as an outpatient. -Continue  Toprol XL -Resume home irbesartan   GERD -Continue Protonix  Anxiety Patient is on Xanax  as an outpatient -Hold Xanax for now   DVT prophylaxis: Heparin subq Code Status:   Code Status: Full Code Family Communication: None at bedside/available Disposition Plan: Discharge likely to SNF in 1-2 days pending urine  and blood culture data, in addition to PT/OT recommendations   Consultants:  None  Procedures:  None  Antimicrobials: Ceftriaxone IV    Subjective: Patient reports no concerns this morning. She has no dyspnea. She is unable to tell me how much oxygen she uses as she titrates up and down depending on how she feels. She reports that she lives alone and has no family.  Objective: BP 130/73 (BP Location: Right Arm)   Pulse 89   Temp 97.8 F (36.6 C)   Resp 18   Ht 5' 3"  (1.6 m)   Wt 50 kg   SpO2 96%   BMI 19.53 kg/m   Examination:  General exam: Appears calm and comfortable Respiratory system: Clear to auscultation. Respiratory effort normal. Cardiovascular system: S1 & S2 heard, RRR. Gastrointestinal system: Abdomen is nondistended, soft and nontender. No organomegaly or masses felt. Normal bowel sounds heard. Central nervous system: Alert and oriented to person and place. No focal neurological deficits. Musculoskeletal: No edema. No calf tenderness Skin: No cyanosis. No rashes Psychiatry: Judgement and insight appear normal. Mood & affect appropriate.    Data Reviewed: I have personally reviewed following labs and imaging studies  CBC Lab Results  Component Value Date   WBC 12.2 (H) 12/09/2021   RBC 5.79 (H) 12/09/2021   HGB 17.1 (H) 12/09/2021   HCT 52.1 (H) 12/09/2021   MCV 90.0 12/09/2021   MCH 29.5 12/09/2021   PLT 233 12/09/2021  MCHC 32.8 12/09/2021   RDW 14.9 12/09/2021   LYMPHSABS 2.8 12/08/2021   MONOABS 0.9 12/08/2021   EOSABS 0.0 12/08/2021   BASOSABS 0.1 04/79/9872     Last metabolic panel Lab Results  Component Value  Date   NA 139 12/09/2021   K 3.9 12/09/2021   CL 100 12/09/2021   CO2 28 12/09/2021   BUN 30 (H) 12/09/2021   CREATININE 0.83 12/09/2021   GLUCOSE 113 (H) 12/09/2021   GFRNONAA >60 12/09/2021   GFRAA 97 06/07/2020   CALCIUM 9.6 12/09/2021   PHOS 3.5 01/15/2020   PROT 7.1 12/09/2021   ALBUMIN 3.5 12/09/2021   BILITOT 0.7 12/09/2021   ALKPHOS 61 12/09/2021   AST 15 12/09/2021   ALT 10 12/09/2021   ANIONGAP 11 12/09/2021    GFR: Estimated Creatinine Clearance: 39.8 mL/min (by C-G formula based on SCr of 0.83 mg/dL).  No results found for this or any previous visit (from the past 240 hour(s)).    Radiology Studies: CT Head Wo Contrast  Result Date: 12/08/2021 CLINICAL DATA:  Altered mental status EXAM: CT HEAD WITHOUT CONTRAST TECHNIQUE: Contiguous axial images were obtained from the base of the skull through the vertex without intravenous contrast. RADIATION DOSE REDUCTION: This exam was performed according to the departmental dose-optimization program which includes automated exposure control, adjustment of the mA and/or kV according to patient size and/or use of iterative reconstruction technique. COMPARISON:  11/09/2021 FINDINGS: Brain: No acute intracranial findings are seen. There are no signs of bleeding within the cranium. Cortical sulci are prominent. There is decreased density in periventricular and subcortical white matter. Vascular: Scattered arterial calcifications are seen. Skull: Unremarkable. Sinuses/Orbits: Unremarkable. Other: No significant interval changes are noted. IMPRESSION: No acute intracranial findings are seen in noncontrast CT brain. Atrophy. Small-vessel disease. No significant interval changes are noted. Electronically Signed   By: Elmer Picker M.D.   On: 12/08/2021 19:19   DG Chest Portable 1 View  Result Date: 12/08/2021 CLINICAL DATA:  Weakness. EXAM: PORTABLE CHEST 1 VIEW COMPARISON:  Chest x-ray dated November 09, 2021. FINDINGS: Unchanged left chest  wall pacemaker. The heart size and mediastinal contours are within normal limits. Normal pulmonary vascularity. No focal consolidation, pleural effusion, or pneumothorax. No acute osseous abnormality. IMPRESSION: 1. No active disease. Electronically Signed   By: Titus Dubin M.D.   On: 12/08/2021 18:02      LOS: 1 day    Cordelia Poche, MD [facility] 12/09/2021, 8:55 AM   If 7PM-7AM, please contact night-coverage [email or web address]

## 2021-12-10 ENCOUNTER — Encounter (HOSPITAL_COMMUNITY): Payer: Self-pay | Admitting: Internal Medicine

## 2021-12-10 DIAGNOSIS — N179 Acute kidney failure, unspecified: Secondary | ICD-10-CM | POA: Diagnosis not present

## 2021-12-10 DIAGNOSIS — F0390 Unspecified dementia without behavioral disturbance: Secondary | ICD-10-CM | POA: Diagnosis not present

## 2021-12-10 DIAGNOSIS — N3000 Acute cystitis without hematuria: Secondary | ICD-10-CM | POA: Diagnosis not present

## 2021-12-10 DIAGNOSIS — Z1330 Encounter for screening examination for mental health and behavioral disorders, unspecified: Secondary | ICD-10-CM

## 2021-12-10 LAB — BASIC METABOLIC PANEL
Anion gap: 12 (ref 5–15)
BUN: 19 mg/dL (ref 8–23)
CO2: 29 mmol/L (ref 22–32)
Calcium: 9.2 mg/dL (ref 8.9–10.3)
Chloride: 93 mmol/L — ABNORMAL LOW (ref 98–111)
Creatinine, Ser: 0.61 mg/dL (ref 0.44–1.00)
GFR, Estimated: >60 mL/min (ref >60)
Glucose, Bld: 177 mg/dL — ABNORMAL HIGH (ref 70–99)
Potassium: 3.9 mmol/L (ref 3.5–5.1)
Sodium: 134 mmol/L — ABNORMAL LOW (ref 135–145)

## 2021-12-10 LAB — URINE CULTURE

## 2021-12-10 LAB — CBC
HCT: 45.9 % (ref 36.0–46.0)
Hemoglobin: 15 g/dL (ref 12.0–15.0)
MCH: 29.6 pg (ref 26.0–34.0)
MCHC: 32.7 g/dL (ref 30.0–36.0)
MCV: 90.5 fL (ref 80.0–100.0)
Platelets: 158 10*3/uL (ref 150–400)
RBC: 5.07 MIL/uL (ref 3.87–5.11)
RDW: 14.8 % (ref 11.5–15.5)
WBC: 7.3 10*3/uL (ref 4.0–10.5)
nRBC: 0 % (ref 0.0–0.2)

## 2021-12-10 LAB — GLUCOSE, CAPILLARY
Glucose-Capillary: 160 mg/dL — ABNORMAL HIGH (ref 70–99)
Glucose-Capillary: 252 mg/dL — ABNORMAL HIGH (ref 70–99)
Glucose-Capillary: 263 mg/dL — ABNORMAL HIGH (ref 70–99)
Glucose-Capillary: 209 mg/dL — ABNORMAL HIGH (ref 70–99)

## 2021-12-10 MED ORDER — STERILE WATER FOR INJECTION IJ SOLN
INTRAMUSCULAR | Status: AC
  Administered 2021-12-10: 2.1 mL
  Filled 2021-12-10: qty 10

## 2021-12-10 MED ORDER — CEFTRIAXONE SODIUM 1 G IJ SOLR
1.0000 g | INTRAMUSCULAR | Status: AC
  Administered 2021-12-10 – 2021-12-11 (×2): 1 g via INTRAMUSCULAR
  Filled 2021-12-10 (×2): qty 10

## 2021-12-10 MED ORDER — POLYETHYLENE GLYCOL 3350 17 G PO PACK
17.0000 g | PACK | Freq: Two times a day (BID) | ORAL | Status: DC
  Administered 2021-12-10 – 2021-12-12 (×5): 17 g via ORAL
  Filled 2021-12-10 (×5): qty 1

## 2021-12-10 NOTE — Progress Notes (Signed)
PROGRESS NOTE    [name]  YQM:[id number] DOB: 15-May-1935 DOA: 12/08/2021 PCP: Wardell Honour, MD   Brief Narrative: [name] is a 86 y.o. female with a history of diabetes mellitus type 2, COPD, chronic respiratory failure, fatty liver disease, hyperlipidemia, CVA, hypertension, GERD, dementia, complete heart block s/p pacemaker. Patient presented secondary to generalized weakness and confusion. Patient found to have evidence of a UTI and started on empiric antibiotics.   Assessment and Plan:  Sepsis secondary to UTI Present on admission. Leukocytosis and tachypnea noted. Urinalysis consistent with possible infection. Urine and blood cultures obtained on admission. Urine culture with multiple species. Patient started empirically on Ceftriaxone IV. IV lost and patient is declining placement of a new IV -Ceftriaxone IM -Follow blood culture results -Repeat urine culture  AKI Baseline creatinine of 0.5. Creatinine of 1.13 on admission which has improved with IV fluids. AKI resolved.  Diabetes mellitus, type 2 Most recent hemoglobin A1C of >15.5%. Patient is on insulin therapy as an outpatient but is reportedly nonadherent with regimen. -Continue Semglee 16 units qHS and SSI  Dementia Patient with intermittent confusion issues. Currently lives at home alone. Concerns for patient's ability to care for herself has been noted. Patient has a history of capacity evaluations from Psychiatry and at that time had capacity to make decisions. She appears to still have capacity. Psychiatry has evaluated and agrees that patient has capacity with recommendations for APS referral.  Polycythemia Mild. Possibly secondary to hypovolemia/dehydration. Resolved.  COPD -Continue Pulmicort, Brovana, Incruse Ellipta and albuterol  Hyperlipidemia -Continue pravastatin  History of CVA Patient is listed as taking aspirin, Plavix and pravastatin. Unsure if she is still meant to be on  dual antiplatelet medication. -Continue Aspirin and Pravastatin  Complete heart block S/p pacemaker  Primary hypertension Patient is on irbesartan and Toprol XL as an outpatient. -Continue Toprol XL and irbesartan   GERD -Continue Protonix  Anxiety Patient is on Xanax  as an outpatient -Hold Xanax for now   DVT prophylaxis: Heparin subq Code Status:   Code Status: Full Code Family Communication: None at bedside/available Disposition Plan: Discharge likely home as patient is declining SNF. Anticipate discharge in 1-2 days pending repeat urine culture data and/or transition to oral antibiotics and blood culture data   Consultants:  Psychiatry  Procedures:  None  Antimicrobials: Ceftriaxone IV/IM   Subjective: Patient states she has no issues from overnight but is unhappy because her food was delayed.   Objective: BP 120/63 (BP Location: Right Arm)   Pulse 65   Temp 98 F (36.7 C) (Oral)   Resp 18   Ht 5' 3"  (1.6 m)   Wt 50 kg   SpO2 96%   BMI 19.53 kg/m   Examination:  General exam: Appears calm and comfortable Respiratory system: Clear to auscultation. Respiratory effort normal. Cardiovascular system: S1 & S2 heard, RRR. Gastrointestinal system: Abdomen is nondistended, soft and nontender. No organomegaly or masses felt. Normal bowel sounds heard. Central nervous system: Alert and oriented to person and place only. Musculoskeletal: No edema. No calf tenderness Skin: No cyanosis. No rashes    Data Reviewed: I have personally reviewed following labs and imaging studies  CBC Lab Results  Component Value Date   WBC 7.3 12/10/2021   RBC 5.07 12/10/2021   HGB 15.0 12/10/2021   HCT 45.9 12/10/2021   MCV 90.5 12/10/2021   MCH 29.6 12/10/2021   PLT 158 12/10/2021   MCHC 32.7 12/10/2021   RDW 14.8  12/10/2021   LYMPHSABS 2.8 12/08/2021   MONOABS 0.9 12/08/2021   EOSABS 0.0 12/08/2021   BASOSABS 0.1 49/17/9150     Last metabolic panel Lab Results   Component Value Date   NA 134 (L) 12/10/2021   K 3.9 12/10/2021   CL 93 (L) 12/10/2021   CO2 29 12/10/2021   BUN 19 12/10/2021   CREATININE 0.61 12/10/2021   GLUCOSE 177 (H) 12/10/2021   GFRNONAA >60 12/10/2021   GFRAA 97 06/07/2020   CALCIUM 9.2 12/10/2021   PHOS 3.5 01/15/2020   PROT 7.1 12/09/2021   ALBUMIN 3.5 12/09/2021   BILITOT 0.7 12/09/2021   ALKPHOS 61 12/09/2021   AST 15 12/09/2021   ALT 10 12/09/2021   ANIONGAP 12 12/10/2021    GFR: Estimated Creatinine Clearance: 41.3 mL/min (by C-G formula based on SCr of 0.61 mg/dL).  Recent Results (from the past 240 hour(s))  Blood culture (routine x 2)     Status: None (Preliminary result)   Collection Time: 12/08/21  5:43 PM   Specimen: BLOOD LEFT ARM  Result Value Ref Range Status   Specimen Description BLOOD LEFT ARM  Final   Special Requests   Final    BOTTLES DRAWN AEROBIC AND ANAEROBIC Blood Culture results may not be optimal due to an excessive volume of blood received in culture bottles   Culture   Final    NO GROWTH 2 DAYS Performed at [facility], [place] [address] [place], [place] [address]    Report Status PENDING  Incomplete  Blood culture (routine x 2)     Status: None (Preliminary result)   Collection Time: 12/08/21  8:15 PM   Specimen: BLOOD  Result Value Ref Range Status   Specimen Description BLOOD SITE NOT SPECIFIED  Final   Special Requests   Final    BOTTLES DRAWN AEROBIC AND ANAEROBIC Blood Culture adequate volume   Culture   Final    NO GROWTH 2 DAYS Performed at [facility], [address], [place], [place] [address]    Report Status PENDING  Incomplete  Urine Culture     Status: Abnormal   Collection Time: 12/08/21  9:43 PM   Specimen: Urine, Clean Catch  Result Value Ref Range Status   Specimen Description URINE, CLEAN CATCH  Final   Special Requests   Final    NONE Performed at [facility], [place] [address], [place], [place] [address]    Culture MULTIPLE SPECIES  PRESENT, SUGGEST RECOLLECTION (A)  Final   Report Status 12/10/2021 FINAL  Final      Radiology Studies: CT Head Wo Contrast  Result Date: 12/08/2021 CLINICAL DATA:  Altered mental status EXAM: CT HEAD WITHOUT CONTRAST TECHNIQUE: Contiguous axial images were obtained from the base of the skull through the vertex without intravenous contrast. RADIATION DOSE REDUCTION: This exam was performed according to the departmental dose-optimization program which includes automated exposure control, adjustment of the mA and/or kV according to patient size and/or use of iterative reconstruction technique. COMPARISON:  11/09/2021 FINDINGS: Brain: No acute intracranial findings are seen. There are no signs of bleeding within the cranium. Cortical sulci are prominent. There is decreased density in periventricular and subcortical white matter. Vascular: Scattered arterial calcifications are seen. Skull: Unremarkable. Sinuses/Orbits: Unremarkable. Other: No significant interval changes are noted. IMPRESSION: No acute intracranial findings are seen in noncontrast CT brain. Atrophy. Small-vessel disease. No significant interval changes are noted. Electronically Signed   By: Prudy Feeler.D.  On: 12/08/2021 19:19   DG Chest Portable 1 View  Result Date: 12/08/2021 CLINICAL DATA:  Weakness. EXAM: PORTABLE CHEST 1 VIEW COMPARISON:  Chest x-ray dated November 09, 2021. FINDINGS: Unchanged left chest wall pacemaker. The heart size and mediastinal contours are within normal limits. Normal pulmonary vascularity. No focal consolidation, pleural effusion, or pneumothorax. No acute osseous abnormality. IMPRESSION: 1. No active disease. Electronically Signed   By: Titus Dubin M.D.   On: 12/08/2021 18:02      LOS: 2 days    Cordelia Poche, MD [facility] 12/10/2021, 2:14 PM   If 7PM-7AM, please contact night-coverage [email or web address]

## 2021-12-10 NOTE — Evaluation (Signed)
Occupational Therapy Evaluation Patient Details Name: [name] MRN: [id number] DOB: Mar 20, 1936 Today's Date: 12/10/2021   History of Present Illness [name] is a 86 y.o. female presented with generalized weakness and confusion. found to have evidence of a UTI and started on empiric antibiotics.  She has a history of diabetes mellitus type 2, COPD, chronic respiratory failure, fatty liver disease, hyperlipidemia, CVA, hypertension, GERD, dementia, complete heart block s/p pacemaker.   Clinical Impression      Pt currently at mod assist +2 for supine to sit as well as sit to stand.  Unable to tolerate standing for more than 15 seconds for 2 intervals secondary to back pain and fatigue.  Needs mod to max assist for simulated bathing and dressing tasks sit to stand as well with poor awareness of her situation.  She acknowledges difficulty standing or getting up, but does not relate that to not being able to manage at home.  Feel she will benefit from acute care OT to address these deficits but feel she will need 24 hour supervision/assist at home for safe discharge.  Since this is not available continue to recommend SNF level therapy.     Recommendations for follow up therapy are one component of a multi-disciplinary discharge planning process, led by the attending physician.  Recommendations may be updated based on patient status, additional functional criteria and insurance authorization.   Follow Up Recommendations  Skilled nursing-short term rehab (<3 hours/day)    Assistance Recommended at Discharge Frequent or constant Supervision/Assistance  Patient can return home with the following A lot of help with walking and/or transfers;A lot of help with bathing/dressing/bathroom;Assist for transportation;Help with stairs or ramp for entrance;Direct supervision/assist for medications management;Assistance with cooking/housework;Direct supervision/assist for financial management     Functional Status Assessment  Patient has had a recent decline in their functional status and demonstrates the ability to make significant improvements in function in a reasonable and predictable amount of time.  Equipment Recommendations  None recommended by OT;Other (comment) (defer to SNF)       Precautions / Restrictions Precautions Precautions: Fall Restrictions Weight Bearing Restrictions: No      Mobility Bed Mobility Overal bed mobility: Needs Assistance Bed Mobility: Supine to Sit, Sit to Supine     Supine to sit: Mod assist, +2 for physical assistance Sit to supine: Mod assist, +2 for physical assistance   General bed mobility comments: Pt needed assist with transitioning trunk up to sitting from [facility] elevated and then scooting to the edge as well as bringing LEs back into the bed and controlling descent when transferring to supine.    Transfers Overall transfer level: Needs assistance Equipment used: Rolling walker (2 wheels), 2 person hand held assist Transfers: Sit to/from Stand Sit to Stand: Mod assist, +2 physical assistance           General transfer comment: Limited sit to stand tolerance of less than 15 seconds with increased trunk flexion and cueing for hand placement.      Balance Overall balance assessment: Needs assistance Sitting-balance support: Bilateral upper extremity supported, Feet supported Sitting balance-Leahy Scale: Fair     Standing balance support: During functional activity, Reliant on assistive device for balance Standing balance-Leahy Scale: Poor Standing balance comment: Pt needed use of the RW as well as therapist assist.                           ADL either performed or  assessed with clinical judgement   ADL Overall ADL's : Needs assistance/impaired Eating/Feeding: Independent;Sitting   Grooming: Wash/dry face;Wash/dry hands;Set up Grooming Details (indicate cue type and reason): simulated Upper Body Bathing:  Set up;Sitting Upper Body Bathing Details (indicate cue type and reason): simulated Lower Body Bathing: Moderate assistance;+2 for safety/equipment;Sit to/from stand Lower Body Bathing Details (indicate cue type and reason): simulated Upper Body Dressing : Minimal assistance;Sitting Upper Body Dressing Details (indicate cue type and reason): simulated Lower Body Dressing: Maximal assistance;Sit to/from stand   Toilet Transfer: Moderate assistance;+2 for safety/equipment;Rolling walker (2 wheels) Toilet Transfer Details (indicate cue type and reason): simulated secondary to pt declining need to sit on the 3:1 Toileting- Clothing Manipulation and Hygiene: Moderate assistance;Sit to/from stand;+2 for safety/equipment       Functional mobility during ADLs: Cueing for safety;Cueing for sequencing;Rolling walker (2 wheels);Moderate assistance;+2 for safety/equipment General ADL Comments: Pt with self limitations throughout limited session.  Finally agreed to attempt transfer to the EOB and for standing.  She was only able to stand for less than 15 seconds X2 in order for therapist to remove soiled washcloth and for removal and replacement of bed pad.  Increased trunk flexion also noted in standing.  Will not be safe at home without 24 hr assist.     Vision Baseline Vision/History: 0 No visual deficits Ability to See in Adequate Light: 0 Adequate Patient Visual Report: No change from baseline Vision Assessment?: No apparent visual deficits     Perception Perception Perception: Within Functional Limits   Praxis Praxis Praxis: Intact    Pertinent Vitals/Pain Pain Assessment Pain Assessment: Faces Faces Pain Scale: Hurts a little bit Pain Location: back Pain Descriptors / Indicators: Discomfort Pain Intervention(s): Limited activity within patient's tolerance, Monitored during session, Repositioned     Hand Dominance Right   Extremity/Trunk Assessment Upper Extremity Assessment Upper  Extremity Assessment: Generalized weakness (not formally assessed but grossly WFLs for bed mobility)   Lower Extremity Assessment Lower Extremity Assessment: Defer to PT evaluation   Cervical / Trunk Assessment Cervical / Trunk Assessment: Kyphotic   Communication Communication Communication: No difficulties   Cognition Arousal/Alertness: Awake/alert Behavior During Therapy:  (talkative) Overall Cognitive Status: Impaired/Different from baseline Area of Impairment: Safety/judgement, Awareness, Problem solving, Memory, Orientation                 Orientation Level: Time, Situation, Disoriented to   Memory: Decreased short-term memory   Safety/Judgement: Decreased awareness of safety, Decreased awareness of deficits Awareness: Intellectual Problem Solving: Decreased initiation, Requires verbal cues, Requires tactile cues General Comments: Pt initially stating she wasn't getting up but over time with conversation she agreed to try.  Pt with multiple eposides of confusion stating she didn't have something at home and then in the next sentence stating she did.  Hyperverbal throughout session with poor awareness of limitations and how she would manage at home at current level.  Oriented to day of the week but not month or reason for being in the hospital.                Home Living Family/patient expects to be discharged to:: Skilled nursing facility Living Arrangements: Alone Available Help at Discharge: Personal care attendant (per chart 5 days/wk and 9am-3pm) Type of Home: Apartment Home Access: Stairs to enter Entrance Stairs-Number of Steps: 2 Entrance Stairs-Rails: Can reach both Home Layout: One level     Bathroom Shower/Tub: Teacher, early years/pre: Standard Bathroom Accessibility: No   Home Equipment:  Rolling Walker (2 wheels);Other (comment) (home O2)          Prior Functioning/Environment               Mobility Comments: used RW for  mobility ADLs Comments: pt reported being able to complete bathing and dressing without assistance, but unsure of reliability secondary to confusion        OT Problem List: Decreased strength;Decreased activity tolerance;Impaired balance (sitting and/or standing);Decreased safety awareness;Decreased cognition;Pain;Decreased knowledge of use of DME or AE      OT Treatment/Interventions: Self-care/ADL training;Patient/family education;Balance training;Therapeutic activities;DME and/or AE instruction;Cognitive remediation/compensation    OT Goals(Current goals can be found in the care plan section) Acute Rehab OT Goals Patient Stated Goal: Pt wants to go home OT Goal Formulation: With patient Time For Goal Achievement: 12/24/21 Potential to Achieve Goals: Good  OT Frequency: Min 2X/week       AM-PAC OT "6 Clicks" Daily Activity     Outcome Measure Help from another person eating meals?: None Help from another person taking care of personal grooming?: A Little Help from another person toileting, which includes using toliet, bedpan, or urinal?: A Lot Help from another person bathing (including washing, rinsing, drying)?: A Lot Help from another person to put on and taking off regular upper body clothing?: A Little Help from another person to put on and taking off regular lower body clothing?: A Lot 6 Click Score: 16   End of Session Equipment Utilized During Treatment: Rolling walker (2 wheels) Nurse Communication: Mobility status  Activity Tolerance: Patient limited by pain;Other (comment) (self limiting as well) Patient left: in bed;with call bell/phone within reach  OT Visit Diagnosis: Unsteadiness on feet (R26.81);Muscle weakness (generalized) (M62.81);Repeated falls (R29.6);Other symptoms and signs involving cognitive function;Other abnormalities of gait and mobility (R26.89)                Time: 1610-9604 OT Time Calculation (min): 33 min Charges:  OT General Charges $OT  Visit: 1 Visit OT Evaluation $OT Eval Moderate Complexity: 1 Mod Sharunda Salmon OTR/L 12/10/2021, 4:49 PM

## 2021-12-10 NOTE — Evaluation (Signed)
Physical Therapy Evaluation Patient Details Name: [name] MRN: [id number] DOB: 1935/07/20 Today's Date: 12/10/2021  History of Present Illness  86 y/o female presented to ED on 12/08/21 for weakness, lethargy, and AMS. Found to have sepsis 2/2 UTI. PMH: COPD on home O2, glaucoma, T2DM, complete heart block s/p pacemaker, hx of CVA, Sjogren syndrome, dementia, NASH  Clinical Impression  Patient admitted with the above. PTA, patient lives alone but per chart review has PCA who comes 5 days/week for 6 hours/day but unsure of PLOF as patient with impaired cognition throughout session and intermittent times of patient thinking she was at home. Patient currently presents with weakness, impaired balance, decreased activity tolerance, and impaired cognition. Patient required modA+2 for sit to stand and unable to stand >15 seconds x 2. Poor safety awareness with patient stating she can go home but unable to stand x 15 seconds or ambulate. Patient will benefit from skilled PT services during acute stay to address listed deficits. Recommend SNF at discharge to maximize functional mobility unless patient has 24 hour supervision/assistance at home.      Recommendations for follow up therapy are one component of a multi-disciplinary discharge planning process, led by the attending physician.  Recommendations may be updated based on patient status, additional functional criteria and insurance authorization.  Follow Up Recommendations Skilled nursing-short term rehab (<3 hours/day) Can patient physically be transported by private vehicle: No    Assistance Recommended at Discharge Frequent or constant Supervision/Assistance  Patient can return home with the following  A lot of help with walking and/or transfers;A lot of help with bathing/dressing/bathroom;Assistance with cooking/housework;Direct supervision/assist for medications management;Direct supervision/assist for financial management;Assist for  transportation;Help with stairs or ramp for entrance    Equipment Recommendations None recommended by PT  Recommendations for Other Services       Functional Status Assessment Patient has had a recent decline in their functional status and demonstrates the ability to make significant improvements in function in a reasonable and predictable amount of time.     Precautions / Restrictions Precautions Precautions: Fall Restrictions Weight Bearing Restrictions: No      Mobility  Bed Mobility Overal bed mobility: Needs Assistance Bed Mobility: Supine to Sit, Sit to Supine     Supine to sit: Mod assist, +2 for physical assistance Sit to supine: Mod assist, +2 for physical assistance   General bed mobility comments: Pt needed assist with transitioning trunk up to sitting from [facility] elevated and then scooting to the edge as well as bringing LEs back into the bed and controlling descent when transferring to supine.    Transfers Overall transfer level: Needs assistance Equipment used: Rolling Seairra Otani (2 wheels) Transfers: Sit to/from Stand Sit to Stand: Mod assist, +2 physical assistance           General transfer comment: Limited sit to stand tolerance of less than 15 seconds with increased trunk flexion and cueing for hand placement.    Ambulation/Gait               General Gait Details: declined ambulation  Stairs            Wheelchair Mobility    Modified Rankin (Stroke Patients Only)       Balance Overall balance assessment: Needs assistance Sitting-balance support: Bilateral upper extremity supported, Feet supported Sitting balance-Leahy Scale: Fair     Standing balance support: During functional activity, Reliant on assistive device for balance Standing balance-Leahy Scale: Poor  Pertinent Vitals/Pain Pain Assessment Pain Assessment: Faces Faces Pain Scale: Hurts a little bit Pain Location: back Pain  Descriptors / Indicators: Discomfort Pain Intervention(s): Monitored during session    Home Living Family/patient expects to be discharged to:: Skilled nursing facility Living Arrangements: Alone Available Help at Discharge: Personal care attendant (per chart 5 days/wk and 9am-3pm) Type of Home: Apartment Home Access: Stairs to enter Entrance Stairs-Rails: Can reach both Entrance Stairs-Number of Steps: 2   Home Layout: One level Home Equipment: Conservation officer, nature (2 wheels);Other (comment) (home O2)      Prior Function Prior Level of Function : Patient poor historian/Family not available             Mobility Comments: used RW for mobility ADLs Comments: pt reported being able to complete bathing and dressing without assistance, but unsure of reliability secondary to confusion     Hand Dominance   Dominant Hand: Right    Extremity/Trunk Assessment   Upper Extremity Assessment Upper Extremity Assessment: Defer to OT evaluation    Lower Extremity Assessment Lower Extremity Assessment: Generalized weakness    Cervical / Trunk Assessment Cervical / Trunk Assessment: Kyphotic  Communication   Communication: No difficulties  Cognition Arousal/Alertness: Awake/alert Behavior During Therapy: WFL for tasks assessed/performed Overall Cognitive Status: Impaired/Different from baseline Area of Impairment: Safety/judgement, Awareness, Problem solving, Memory, Orientation                 Orientation Level: Disoriented to, Time, Situation, Place   Memory: Decreased short-term memory   Safety/Judgement: Decreased awareness of safety, Decreased awareness of deficits Awareness: Intellectual Problem Solving: Decreased initiation, Requires verbal cues, Requires tactile cues General Comments: Pt initially stating she wasn't getting up but over time with conversation she agreed to try.  Pt with multiple eposides of confusion stating she didn't have something at home and then in  the next sentence stating she did.  Hyperverbal throughout session with poor awareness of limitations and how she would manage at home at current level.  Oriented to day of the week but not month or reason for being in the hospital. intermittent times of clarity with able to identify being at hospital and other times of stating she was at home by stating "make sure you shut the back door cause I'm here by myself"        General Comments      Exercises     Assessment/Plan    PT Assessment Patient needs continued PT services  PT Problem List Decreased strength;Decreased activity tolerance;Decreased balance;Decreased mobility;Decreased cognition;Decreased safety awareness;Decreased knowledge of precautions;Cardiopulmonary status limiting activity       PT Treatment Interventions Gait training;DME instruction;Functional mobility training;Therapeutic activities;Therapeutic exercise;Balance training;Patient/family education    PT Goals (Current goals can be found in the Care Plan section)  Acute Rehab PT Goals Patient Stated Goal: did not state PT Goal Formulation: Patient unable to participate in goal setting Time For Goal Achievement: 12/24/21 Potential to Achieve Goals: Fair    Frequency Min 2X/week     Co-evaluation PT/OT/SLP Co-Evaluation/Treatment: Yes Reason for Co-Treatment: Necessary to address cognition/behavior during functional activity;For patient/therapist safety PT goals addressed during session: Balance;Mobility/safety with mobility         AM-PAC PT "6 Clicks" Mobility  Outcome Measure Help needed turning from your back to your side while in a flat bed without using bedrails?: Total Help needed moving from lying on your back to sitting on the side of a flat bed without using bedrails?: Total Help needed moving  to and from a bed to a chair (including a wheelchair)?: Total Help needed standing up from a chair using your arms (e.g., wheelchair or bedside chair)?:  Total Help needed to walk in hospital room?: Total Help needed climbing 3-5 steps with a railing? : Total 6 Click Score: 6    End of Session Equipment Utilized During Treatment: Oxygen Activity Tolerance: Patient tolerated treatment well Patient left: in bed;with call bell/phone within reach;with bed alarm set Nurse Communication: Mobility status PT Visit Diagnosis: Unsteadiness on feet (R26.81);Muscle weakness (generalized) (M62.81);Difficulty in walking, not elsewhere classified (R26.2)    Time: 8377-9396 PT Time Calculation (min) (ACUTE ONLY): 32 min   Charges:   PT Evaluation $PT Eval Moderate Complexity: 1 Mod          Ameia Morency A. Gilford Rile PT, DPT Acute Rehabilitation Services Office [phone]   Linna Hoff 12/10/2021, 4:59 PM

## 2021-12-10 NOTE — Progress Notes (Signed)
Pt pulled out her IV and refused when IV team attempted. Will notify provider.
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
## 2021-12-11 DIAGNOSIS — F0390 Unspecified dementia without behavioral disturbance: Secondary | ICD-10-CM | POA: Diagnosis not present

## 2021-12-11 DIAGNOSIS — N3000 Acute cystitis without hematuria: Secondary | ICD-10-CM | POA: Diagnosis not present

## 2021-12-11 DIAGNOSIS — N179 Acute kidney failure, unspecified: Secondary | ICD-10-CM | POA: Diagnosis not present

## 2021-12-11 LAB — GLUCOSE, CAPILLARY
Glucose-Capillary: 221 mg/dL — ABNORMAL HIGH (ref 70–99)
Glucose-Capillary: 324 mg/dL — ABNORMAL HIGH (ref 70–99)
Glucose-Capillary: 289 mg/dL — ABNORMAL HIGH (ref 70–99)
Glucose-Capillary: 257 mg/dL — ABNORMAL HIGH (ref 70–99)

## 2021-12-11 MED ORDER — INSULIN GLARGINE-YFGN 100 UNIT/ML ~~LOC~~ SOLN
20.0000 [IU] | Freq: Every day | SUBCUTANEOUS | Status: DC
  Administered 2021-12-11: 20 [IU] via SUBCUTANEOUS
  Filled 2021-12-11 (×2): qty 0.2

## 2021-12-11 MED ORDER — STERILE WATER FOR INJECTION IJ SOLN
INTRAMUSCULAR | Status: AC
  Administered 2021-12-11: 10 mL
  Filled 2021-12-11: qty 10

## 2021-12-11 NOTE — Progress Notes (Signed)
PROGRESS NOTE    [name]  HQI:[id number] DOB: May 29, 1935 DOA: 12/08/2021 PCP: Wardell Honour, MD   Brief Narrative: [name] is a 86 y.o. female with a history of diabetes mellitus type 2, COPD, chronic respiratory failure, fatty liver disease, hyperlipidemia, CVA, hypertension, GERD, dementia, complete heart block s/p pacemaker. Patient presented secondary to generalized weakness and confusion. Patient found to have evidence of a UTI and started on empiric antibiotics.   Assessment and Plan:  Sepsis secondary to UTI Present on admission. Leukocytosis and tachypnea noted. Urinalysis consistent with possible infection. Urine and blood cultures obtained on admission. Urine culture with multiple species. Patient started empirically on Ceftriaxone IV. IV lost and patient is declining placement of a new IV -Ceftriaxone IM -Follow blood culture results -Repeat urine culture  AKI Baseline creatinine of 0.5. Creatinine of 1.13 on admission which has improved with IV fluids. AKI resolved.  Diabetes mellitus, type 2 Most recent hemoglobin A1C of >15.5%. Patient is on insulin therapy as an outpatient but is reportedly nonadherent with regimen. -Increase to Semglee 20 units qHS and continue SSI  Dementia Patient with intermittent confusion issues. Currently lives at home alone. Concerns for patient's ability to care for herself has been noted. Patient has a history of capacity evaluations from Psychiatry and at that time had capacity to make decisions. She appears to still have capacity. Psychiatry has evaluated and agrees that patient has capacity with recommendations for APS referral.  Polycythemia Mild. Possibly secondary to hypovolemia/dehydration. Resolved.  COPD -Continue Pulmicort, Brovana, Incruse Ellipta and albuterol  Hyperlipidemia -Continue pravastatin  History of CVA Patient is listed as taking aspirin, Plavix and pravastatin. Unsure if she is still meant  to be on dual antiplatelet medication. -Continue Aspirin and Pravastatin  Complete heart block S/p pacemaker  Primary hypertension Patient is on irbesartan and Toprol XL as an outpatient. -Continue Toprol XL and irbesartan   GERD -Continue Protonix  Anxiety Patient is on Xanax  as an outpatient -Hold Xanax for now   DVT prophylaxis: Heparin subq Code Status:   Code Status: Full Code Family Communication: None at bedside/available Disposition Plan: Discharge likely home as patient is declining SNF. Anticipate discharge in 1 day pending repeat urine culture data and/or transition to oral/completion of antibiotics    Consultants:  Psychiatry  Procedures:  None  Antimicrobials: Ceftriaxone IV/IM   Subjective: No concerns this morning.  Objective: BP 120/66 (BP Location: Right Arm)   Pulse 70   Temp 97.6 F (36.4 C) (Axillary)   Resp 17   Ht 5' 3"  (1.6 m)   Wt 50 kg   SpO2 100%   BMI 19.53 kg/m   Examination:  General exam: Appears calm and comfortable Respiratory system: Clear to auscultation. Respiratory effort normal. Cardiovascular system: S1 & S2 heard, RRR. Gastrointestinal system: Abdomen is nondistended, soft and nontender. Normal bowel sounds heard. Central nervous system: Alert. No focal neurological deficits. Musculoskeletal: No edema. No calf tenderness Skin: No cyanosis. No rashes    Data Reviewed: I have personally reviewed following labs and imaging studies  CBC Lab Results  Component Value Date   WBC 7.3 12/10/2021   RBC 5.07 12/10/2021   HGB 15.0 12/10/2021   HCT 45.9 12/10/2021   MCV 90.5 12/10/2021   MCH 29.6 12/10/2021   PLT 158 12/10/2021   MCHC 32.7 12/10/2021   RDW 14.8 12/10/2021   LYMPHSABS 2.8 12/08/2021   MONOABS 0.9 12/08/2021   EOSABS 0.0 12/08/2021   BASOSABS 0.1 12/08/2021  Last metabolic panel Lab Results  Component Value Date   NA 134 (L) 12/10/2021   K 3.9 12/10/2021   CL 93 (L) 12/10/2021   CO2 29  12/10/2021   BUN 19 12/10/2021   CREATININE 0.61 12/10/2021   GLUCOSE 177 (H) 12/10/2021   GFRNONAA >60 12/10/2021   GFRAA 97 06/07/2020   CALCIUM 9.2 12/10/2021   PHOS 3.5 01/15/2020   PROT 7.1 12/09/2021   ALBUMIN 3.5 12/09/2021   BILITOT 0.7 12/09/2021   ALKPHOS 61 12/09/2021   AST 15 12/09/2021   ALT 10 12/09/2021   ANIONGAP 12 12/10/2021    GFR: Estimated Creatinine Clearance: 41.3 mL/min (by C-G formula based on SCr of 0.61 mg/dL).  Recent Results (from the past 240 hour(s))  Blood culture (routine x 2)     Status: None (Preliminary result)   Collection Time: 12/08/21  5:43 PM   Specimen: BLOOD LEFT ARM  Result Value Ref Range Status   Specimen Description BLOOD LEFT ARM  Final   Special Requests   Final    BOTTLES DRAWN AEROBIC AND ANAEROBIC Blood Culture results may not be optimal due to an excessive volume of blood received in culture bottles   Culture   Final    NO GROWTH 3 DAYS Performed at [facility], [place] [address], [place], [place] [address]    Report Status PENDING  Incomplete  Blood culture (routine x 2)     Status: None (Preliminary result)   Collection Time: 12/08/21  8:15 PM   Specimen: BLOOD  Result Value Ref Range Status   Specimen Description BLOOD SITE NOT SPECIFIED  Final   Special Requests   Final    BOTTLES DRAWN AEROBIC AND ANAEROBIC Blood Culture adequate volume   Culture   Final    NO GROWTH 3 DAYS Performed at [facility], [address], [place], [place] [address]    Report Status PENDING  Incomplete  Urine Culture     Status: Abnormal   Collection Time: 12/08/21  9:43 PM   Specimen: Urine, Clean Catch  Result Value Ref Range Status   Specimen Description URINE, CLEAN CATCH  Final   Special Requests   Final    NONE Performed at [facility], [place] [address], [place], [place] [address]    Culture MULTIPLE SPECIES PRESENT, SUGGEST RECOLLECTION (A)  Final   Report Status 12/10/2021 FINAL  Final      Radiology  Studies: No results found.    LOS: 3 days    Cordelia Poche, MD [facility] 12/11/2021, 11:22 AM   If 7PM-7AM, please contact night-coverage [email or web address]

## 2021-12-12 ENCOUNTER — Other Ambulatory Visit: Payer: Self-pay | Admitting: *Deleted

## 2021-12-12 DIAGNOSIS — E43 Unspecified severe protein-calorie malnutrition: Secondary | ICD-10-CM

## 2021-12-12 DIAGNOSIS — N179 Acute kidney failure, unspecified: Secondary | ICD-10-CM | POA: Diagnosis not present

## 2021-12-12 DIAGNOSIS — Z794 Long term (current) use of insulin: Secondary | ICD-10-CM

## 2021-12-12 DIAGNOSIS — D751 Secondary polycythemia: Secondary | ICD-10-CM | POA: Diagnosis not present

## 2021-12-12 DIAGNOSIS — E1165 Type 2 diabetes mellitus with hyperglycemia: Secondary | ICD-10-CM

## 2021-12-12 LAB — GLUCOSE, CAPILLARY
Glucose-Capillary: 293 mg/dL — ABNORMAL HIGH (ref 70–99)
Glucose-Capillary: 393 mg/dL — ABNORMAL HIGH (ref 70–99)

## 2021-12-12 MED ORDER — TRESIBA FLEXTOUCH 200 UNIT/ML ~~LOC~~ SOPN: 25.0000 [IU] | PEN_INJECTOR | Freq: Every day | SUBCUTANEOUS | Status: DC

## 2021-12-12 MED ORDER — ENSURE ENLIVE PO LIQD: 237.0000 mL | Freq: Two times a day (BID) | ORAL | 30 refills | Status: AC

## 2021-12-12 NOTE — Progress Notes (Signed)
Nsg Discharge Note  Admit Date:  12/08/2021 Discharge date: 12/12/2021   [name] to be D/C'd Home per MD order.  AVS completed.   Reviewed d/c paperwork with patient and answered all questions. NT wheeled stable patient and belongings including O2 tank to main entrance where patient was picked up by [name]. Patient/caregiver able to verbalize understanding.  Discharge Medication: Allergies as of 12/12/2021       Reactions   Chlordiazepoxide-clidinium Other (See Comments)   Sleepy,weak   Biaxin [clarithromycin] Other (See Comments)   Foul taste, abd pain, diarrhea   Tape Other (See Comments)   SKIN IS VERY THIN AND WILL TEAR AND BRUISE EASILY!!   Ultram [tramadol] Other (See Comments)   Caused tremors   Codeine Other (See Comments)   Upset the stomach        Medication List     STOP taking these medications    amoxicillin 500 MG capsule Commonly known as: AMOXIL   Ventolin HFA 108 (90 Base) MCG/ACT inhaler Generic drug: albuterol       TAKE these medications    acetaminophen 325 MG tablet Commonly known as: TYLENOL Take 2 tablets (650 mg total) by mouth every 6 (six) hours as needed for mild pain (or Fever >/= 101).   ALPRAZolam 0.5 MG tablet Commonly known as: XANAX Take 0.25 mg by mouth daily as needed for anxiety.   aspirin EC 81 MG tablet Take 81 mg by mouth daily.   busPIRone 5 MG tablet Commonly known as: BUSPAR Take 5 mg by mouth 3 (three) times daily.   clopidogrel 75 MG tablet Commonly known as: PLAVIX Take 1 tablet (75 mg total) by mouth daily.   feeding supplement Liqd Take 237 mLs by mouth 2 (two) times daily between meals.   fluorometholone 0.1 % ophthalmic suspension Commonly known as: FML Place 1 drop into both eyes 3 (three) times daily.   fluticasone 110 MCG/ACT inhaler Commonly known as: FLOVENT HFA Inhale 1 puff into the lungs 2 (two) times daily.   furosemide 20 MG tablet Commonly known as: LASIX Take 20 mg by mouth daily.    ipratropium-albuterol 0.5-2.5 (3) MG/3ML Soln Commonly known as: DUONEB 1 vial in neb every 8 hours and as needed What changed:  how much to take how to take this when to take this reasons to take this additional instructions   irbesartan 75 MG tablet Commonly known as: AVAPRO Take 75 mg by mouth daily.   levalbuterol 45 MCG/ACT inhaler Commonly known as: XOPENEX HFA INHALE 1-2 PUFFS EVERY 6 HOURS IF NEEDED FOR WHEEZING. What changed: See the new instructions.   metoprolol succinate 50 MG 24 hr tablet Commonly known as: TOPROL-XL TAKE ONE TABLET TWICE A DAY WITH OR IMMEDIATELY FOLLOWING A MEAL. What changed: See the new instructions.   mirabegron ER 50 MG Tb24 tablet Commonly known as: MYRBETRIQ Take 1 tablet (50 mg total) by mouth daily.   multivitamin with minerals Tabs tablet Take 1 tablet by mouth daily.   NovoLOG FlexPen 100 UNIT/ML FlexPen Generic drug: insulin aspart Inject 5 Units into the skin 3 (three) times daily with meals. What changed:  how much to take when to take this additional instructions   pantoprazole 40 MG tablet Commonly known as: Protonix Take 1 tablet (40 mg total) by mouth daily.   pravastatin 20 MG tablet Commonly known as: PRAVACHOL Take 1 tablet (20 mg total) by mouth daily.   Stiolto Respimat 2.5-2.5 MCG/ACT Aers Generic drug: Tiotropium Bromide-Olodaterol  Inhale 2 puffs into the lungs daily.   thiamine 100 MG tablet Commonly known as: VITAMIN B1 Take 1 tablet (100 mg total) by mouth daily.   Tyler Aas FlexTouch 200 UNIT/ML FlexTouch Pen Generic drug: insulin degludec Inject 26 Units into the skin at bedtime. What changed: how much to take        Discharge Assessment: Vitals:   12/12/21 0646 12/12/21 0815  BP: (!) 148/57 (!) 120/58  Pulse: 81 69  Resp: 17 16  Temp: 98 F (36.7 C) 98.1 F (36.7 C)  SpO2: 96% 99%   Skin clean, dry and intact without evidence of skin break down, no evidence of skin tears noted. IV  catheter discontinued intact. Site without signs and symptoms of complications - no redness or edema noted at insertion site, patient denies c/o pain - only slight tenderness at site.  Dressing with slight pressure applied.  D/c Instructions-Education: Discharge instructions given to patient/family with verbalized understanding. D/c education completed with patient/family including follow up instructions, medication list, d/c activities limitations if indicated, with other d/c instructions as indicated by MD - patient able to verbalize understanding, all questions fully answered. Patient instructed to return to ED, call 911, or call MD for any changes in condition.  Patient escorted via [place], and D/C home via private auto.  Santa Lighter, RN 12/12/2021 2:15 PM

## 2021-12-12 NOTE — Patient Outreach (Signed)
[facility]) Care Management  12/12/2021  [name] 10/06/1935 [id number]   [place] coordination- chart review, collaboration  Collaboration with [facility] hospital liaison after brief chart review for follow up, noted pt scheduled discharge home     Plan Consult with [place] and continue collaboration with hospital liaison   [place]. Lavina Hamman, RN, BSN, [place] Coordinator Office number [phone]

## 2021-12-12 NOTE — Care Management Important Message (Signed)
Important Message  Patient Details  Name: [name] MRN: [id number] Date of Birth: 1935-05-20   Medicare Important Message Given:  Yes     [name] 12/12/2021, 1:27 PM

## 2021-12-12 NOTE — TOC Progression Note (Addendum)
Transition of Care [facility]) - Progression Note    Patient Details  Name: [name] MRN: [id number] Date of Birth: [birth year]  [facility] CM/SW Contact  Jacalyn Lefevre Edson Snowball, RN Phone Number: 12/12/2021, 10:14 AM  Clinical Narrative:     Spoke to patient at bedside. Patient declining SNF for rehab. Wants to return to home with [place] and [facility] caregiver.   Called caregiver [facility] [phone], [name] can provide transportation home but will need portable oxygen tank. NCM called [facility] will bring a portable tank to patient's hospital room. Once portable tank in room, [name] would like  nurse to call her and she will come to hospital at that time.   Caryl Pina with [facility] aware of discharge for today.  Orders entered for MD signature     Barriers to Discharge: Continued Medical Work up  Expected Discharge Plan and Services     Discharge Planning Services: CM Consult     Expected Discharge Date: 12/12/21                                     Social Determinants of Health (SDOH) Interventions    Readmission Risk Interventions     No data to display

## 2021-12-12 NOTE — Discharge Instructions (Signed)
[name],  You were in the hospital with confusion and a presumed urinary infection. You have improved to baseline. Please follow-up with your primary care physician.

## 2021-12-12 NOTE — Consult Note (Signed)
   [facility] Inpatient Consult   12/12/2021  [name] [birth year] [id number] [place] [employer] [ACO] Patient: [name]   Follow up note:  Active member follow up  10:35 am Collaboration with [place] of patient's decision for disposition for home with caregiver as she continues to decline SNF Patient remains firm on going home with [facility] and her PCS on rounds.   [facility] RNCM and patient's caregiver [name] to provide transportation.  Plan:  [facility] RN to continue to follow and [facility] for post hospital followup.  Natividad Brood, RN BSN [facility] Liaison  [phone] business mobile phone Toll free office [phone]  Fax number: [phone] [email or web address]

## 2021-12-12 NOTE — Discharge Summary (Signed)
Physician Discharge Summary   Patient: [name] MRN: [id number] DOB: 09/29/1935  Admit date:     12/08/2021  Discharge date: 12/12/21  Discharge Physician: Cordelia Poche, MD   PCP: Wardell Honour, MD   Recommendations at discharge:  Outpatient PCP follow-up Psych recommending consideration of APS referral  Discharge Diagnoses: Active Problems:   AKI (acute kidney injury) ([place])   Uncontrolled type 2 diabetes mellitus with hyperglycemia, with long-term current use of insulin (HCC)   Protein-calorie malnutrition, severe   UTI (urinary tract infection)   Dementia ([place])   Polycythemia  Resolved Problems:   * No resolved hospital problems. *  Hospital Course: [name] is a 86 y.o. female with a history of diabetes mellitus type 2, COPD, chronic respiratory failure, fatty liver disease, hyperlipidemia, CVA, hypertension, GERD, dementia, complete heart block s/p pacemaker. Patient presented secondary to generalized weakness and confusion. Patient found to have evidence of a UTI and started on empiric antibiotics. Patient completed course prior to discharge.  Assessment and Plan:  Severe sepsis secondary to UTI Present on admission. Leukocytosis and tachypnea noted. Lactic acid of 2.5. Urinalysis consistent with possible infection. Urine and blood cultures obtained on admission. Urine culture with multiple species. Patient started empirically on Ceftriaxone IV. IV lost and patient is declining placement of a new IV. Patient completed Ceftriaxone via IM injection.   AKI Baseline creatinine of 0.5. Creatinine of 1.13 on admission which has improved with IV fluids. AKI resolved.   Diabetes mellitus, type 2 Uncontrolled with hyperglycemia. Most recent hemoglobin A1C of >15.5%. Patient is on insulin therapy as an outpatient but is reportedly nonadherent with regimen. Discharge on Tresiba 26 units daily and Novolog 5 units TID with meals.  Acute metabolic  encephalopathy Secondary to UTI. Resolved. Back to baseline.   Dementia Patient with intermittent confusion issues. Currently lives at home alone. Concerns for patient's ability to care for herself has been noted. Patient has a history of capacity evaluations from Psychiatry and at that time had capacity to make decisions. She appears to still have capacity. Psychiatry has evaluated and agrees that patient has capacity with recommendations for APS referral.   Polycythemia Mild. Possibly secondary to hypovolemia/dehydration. Resolved.   COPD Continue Pulmicort, Brovana, Incruse Ellipta and albuterol.   Hyperlipidemia Continue pravastatin.   History of CVA Patient is listed as taking aspirin, Plavix and pravastatin. Unsure if she is still meant to be on dual antiplatelet medication. Continue Aspirin and Pravastatin.   Complete heart block S/p pacemaker   Primary hypertension Patient is on irbesartan and Toprol XL as an outpatient. Continue Toprol XL and irbesartan.    GERD Continue Protonix   Anxiety Patient is on Xanax  as an outpatient.  Severe malnutrition Protein supplement on discharge.  Consultants: Psychiatry Procedures performed: None  Disposition: Home health Diet recommendation: Carb modified diet  DISCHARGE MEDICATION: Allergies as of 12/12/2021       Reactions   Chlordiazepoxide-clidinium Other (See Comments)   Sleepy,weak   Biaxin [clarithromycin] Other (See Comments)   Foul taste, abd pain, diarrhea   Tape Other (See Comments)   SKIN IS VERY THIN AND WILL TEAR AND BRUISE EASILY!!   Ultram [tramadol] Other (See Comments)   Caused tremors   Codeine Other (See Comments)   Upset the stomach        Medication List     STOP taking these medications    amoxicillin 500 MG capsule Commonly known as: AMOXIL   Ventolin HFA 108 (90  Base) MCG/ACT inhaler Generic drug: albuterol       TAKE these medications    acetaminophen 325 MG tablet Commonly  known as: TYLENOL Take 2 tablets (650 mg total) by mouth every 6 (six) hours as needed for mild pain (or Fever >/= 101).   ALPRAZolam 0.5 MG tablet Commonly known as: XANAX Take 0.25 mg by mouth daily as needed for anxiety.   aspirin EC 81 MG tablet Take 81 mg by mouth daily.   busPIRone 5 MG tablet Commonly known as: BUSPAR Take 5 mg by mouth 3 (three) times daily.   clopidogrel 75 MG tablet Commonly known as: PLAVIX Take 1 tablet (75 mg total) by mouth daily.   feeding supplement Liqd Take 237 mLs by mouth 2 (two) times daily between meals.   fluorometholone 0.1 % ophthalmic suspension Commonly known as: FML Place 1 drop into both eyes 3 (three) times daily.   fluticasone 110 MCG/ACT inhaler Commonly known as: FLOVENT HFA Inhale 1 puff into the lungs 2 (two) times daily.   furosemide 20 MG tablet Commonly known as: LASIX Take 20 mg by mouth daily.   ipratropium-albuterol 0.5-2.5 (3) MG/3ML Soln Commonly known as: DUONEB 1 vial in neb every 8 hours and as needed What changed:  how much to take how to take this when to take this reasons to take this additional instructions   irbesartan 75 MG tablet Commonly known as: AVAPRO Take 75 mg by mouth daily.   levalbuterol 45 MCG/ACT inhaler Commonly known as: XOPENEX HFA INHALE 1-2 PUFFS EVERY 6 HOURS IF NEEDED FOR WHEEZING. What changed: See the new instructions.   metoprolol succinate 50 MG 24 hr tablet Commonly known as: TOPROL-XL TAKE ONE TABLET TWICE A DAY WITH OR IMMEDIATELY FOLLOWING A MEAL. What changed: See the new instructions.   mirabegron ER 50 MG Tb24 tablet Commonly known as: MYRBETRIQ Take 1 tablet (50 mg total) by mouth daily.   multivitamin with minerals Tabs tablet Take 1 tablet by mouth daily.   NovoLOG FlexPen 100 UNIT/ML FlexPen Generic drug: insulin aspart Inject 5 Units into the skin 3 (three) times daily with meals. What changed:  how much to take when to take this additional  instructions   pantoprazole 40 MG tablet Commonly known as: Protonix Take 1 tablet (40 mg total) by mouth daily.   pravastatin 20 MG tablet Commonly known as: PRAVACHOL Take 1 tablet (20 mg total) by mouth daily.   Stiolto Respimat 2.5-2.5 MCG/ACT Aers Generic drug: Tiotropium Bromide-Olodaterol Inhale 2 puffs into the lungs daily.   thiamine 100 MG tablet Commonly known as: VITAMIN B1 Take 1 tablet (100 mg total) by mouth daily.   Tyler Aas FlexTouch 200 UNIT/ML FlexTouch Pen Generic drug: insulin degludec Inject 26 Units into the skin at bedtime. What changed: how much to take        Follow-up Information     Wardell Honour, MD. Schedule an appointment as soon as possible for a visit.   Specialties: Family Medicine, Emergency Medicine Why: For hospital follow-up Contact information: [place] [id number] [phone]                Discharge Exam: BP (!) 120/58 (BP Location: Right Arm)   Pulse 69   Temp 98.1 F (36.7 C) (Oral)   Resp 16   Ht 5' 3"  (1.6 m)   Wt 50 kg   SpO2 99%   BMI 19.53 kg/m   General exam: Appears calm and comfortable Respiratory  system: Clear to auscultation. Respiratory effort normal. Cardiovascular system: S1 & S2 heard, RRR. No murmurs, rubs, gallops or clicks. Gastrointestinal system: Abdomen is nondistended, soft and nontender. Normal bowel sounds heard. Central nervous system: Alert. Musculoskeletal: No edema. No calf tenderness Skin: No cyanosis. No rashes  Condition at discharge: stable  The results of significant diagnostics from this hospitalization (including imaging, microbiology, ancillary and laboratory) are listed below for reference.   Imaging Studies: CT Head Wo Contrast  Result Date: 12/08/2021 CLINICAL DATA:  Altered mental status EXAM: CT HEAD WITHOUT CONTRAST TECHNIQUE: Contiguous axial images were obtained from the base of the skull through the vertex without intravenous contrast.  RADIATION DOSE REDUCTION: This exam was performed according to the departmental dose-optimization program which includes automated exposure control, adjustment of the mA and/or kV according to patient size and/or use of iterative reconstruction technique. COMPARISON:  11/09/2021 FINDINGS: Brain: No acute intracranial findings are seen. There are no signs of bleeding within the cranium. Cortical sulci are prominent. There is decreased density in periventricular and subcortical white matter. Vascular: Scattered arterial calcifications are seen. Skull: Unremarkable. Sinuses/Orbits: Unremarkable. Other: No significant interval changes are noted. IMPRESSION: No acute intracranial findings are seen in noncontrast CT brain. Atrophy. Small-vessel disease. No significant interval changes are noted. Electronically Signed   By: Elmer Picker M.D.   On: 12/08/2021 19:19   DG Chest Portable 1 View  Result Date: 12/08/2021 CLINICAL DATA:  Weakness. EXAM: PORTABLE CHEST 1 VIEW COMPARISON:  Chest x-ray dated November 09, 2021. FINDINGS: Unchanged left chest wall pacemaker. The heart size and mediastinal contours are within normal limits. Normal pulmonary vascularity. No focal consolidation, pleural effusion, or pneumothorax. No acute osseous abnormality. IMPRESSION: 1. No active disease. Electronically Signed   By: Titus Dubin M.D.   On: 12/08/2021 18:02   CUP PACEART REMOTE DEVICE CHECK  Result Date: 12/05/2021 Scheduled remote reviewed. Normal device function.  1 HVR showing 15sec of regular 1:1, mean HR 16 11 AMS, PAT, 4-12sec in duration Next remote 91 days. [place]   Microbiology: Results for orders placed or performed during the hospital encounter of 12/08/21  Blood culture (routine x 2)     Status: None (Preliminary result)   Collection Time: 12/08/21  5:43 PM   Specimen: BLOOD LEFT ARM  Result Value Ref Range Status   Specimen Description BLOOD LEFT ARM  Final   Special Requests   Final    BOTTLES DRAWN  AEROBIC AND ANAEROBIC Blood Culture results may not be optimal due to an excessive volume of blood received in culture bottles   Culture   Final    NO GROWTH 4 DAYS Performed at [facility], [place] [address], [place], [place] [address]    Report Status PENDING  Incomplete  Blood culture (routine x 2)     Status: None (Preliminary result)   Collection Time: 12/08/21  8:15 PM   Specimen: BLOOD  Result Value Ref Range Status   Specimen Description BLOOD SITE NOT SPECIFIED  Final   Special Requests   Final    BOTTLES DRAWN AEROBIC AND ANAEROBIC Blood Culture adequate volume   Culture   Final    NO GROWTH 4 DAYS Performed at [facility], [address] [place], [place] [address]    Report Status PENDING  Incomplete  Urine Culture     Status: Abnormal   Collection Time: 12/08/21  9:43 PM   Specimen: Urine, Clean Catch  Result Value Ref Range Status  Specimen Description URINE, CLEAN CATCH  Final   Special Requests   Final    NONE Performed at [facility], [place] [address], [place], [place] [address]    Culture MULTIPLE SPECIES PRESENT, SUGGEST RECOLLECTION (A)  Final   Report Status 12/10/2021 FINAL  Final   *Note: Due to a large number of results and/or encounters for the requested time period, some results have not been displayed. A complete set of results can be found in Results Review.    Labs: CBC: Recent Labs  Lab 12/08/21 1743 12/08/21 1753 12/09/21 0231 12/10/21 0055  WBC 12.7*  --  12.2* 7.3  NEUTROABS 8.8*  --   --   --   HGB 18.6* 20.1* 17.1* 15.0  HCT 56.8* 59.0* 52.1* 45.9  MCV 89.9  --  90.0 90.5  PLT 335  --  233 680   Basic Metabolic Panel: Recent Labs  Lab 12/08/21 1743 12/08/21 1753 12/09/21 0231 12/10/21 0418  NA 136 136 139 134*  K 4.2 4.3 3.9 3.9  CL 92*  --  100 93*  CO2 27  --  28 29  GLUCOSE 221*  --  113* 177*  BUN 34*  --  30* 19  CREATININE 1.13*  --  0.83 0.61  CALCIUM 10.7*  --  9.6 9.2   Liver Function  Tests: Recent Labs  Lab 12/08/21 1743 12/09/21 0231  AST 17 15  ALT 14 10  ALKPHOS 79 61  BILITOT 0.9 0.7  PROT 8.9* 7.1  ALBUMIN 4.6 3.5   CBG: Recent Labs  Lab 12/11/21 1131 12/11/21 1640 12/11/21 2125 12/12/21 0817 12/12/21 1219  GLUCAP 324* 289* 257* 293* 393*    Discharge time spent: 35 minutes.  Signed: Cordelia Poche, MD [facility] 12/12/2021

## 2021-12-12 NOTE — Plan of Care (Signed)
  Problem: Education: Goal: Knowledge of General Education information will improve Description: Including pain rating scale, medication(s)/side effects and non-pharmacologic comfort measures 12/12/2021 1054 by Santa Lighter, RN Outcome: Adequate for Discharge 12/12/2021 1053 by Santa Lighter, RN Outcome: Progressing   Problem: Health Behavior/Discharge Planning: Goal: Ability to manage health-related needs will improve 12/12/2021 1054 by Santa Lighter, RN Outcome: Adequate for Discharge 12/12/2021 1053 by Santa Lighter, RN Outcome: Progressing   Problem: Clinical Measurements: Goal: Ability to maintain clinical measurements within normal limits will improve Outcome: Adequate for Discharge Goal: Will remain free from infection Outcome: Adequate for Discharge Goal: Diagnostic test results will improve Outcome: Adequate for Discharge Goal: Respiratory complications will improve Outcome: Adequate for Discharge Goal: Cardiovascular complication will be avoided Outcome: Adequate for Discharge   Problem: Activity: Goal: Risk for activity intolerance will decrease Outcome: Adequate for Discharge   Problem: Nutrition: Goal: Adequate nutrition will be maintained Outcome: Adequate for Discharge   Problem: Coping: Goal: Level of anxiety will decrease Outcome: Adequate for Discharge   Problem: Elimination: Goal: Will not experience complications related to bowel motility Outcome: Adequate for Discharge Goal: Will not experience complications related to urinary retention Outcome: Adequate for Discharge   Problem: Pain Managment: Goal: General experience of comfort will improve Outcome: Adequate for Discharge   Problem: Safety: Goal: Ability to remain free from injury will improve Outcome: Adequate for Discharge   Problem: Skin Integrity: Goal: Risk for impaired skin integrity will decrease Outcome: Adequate for Discharge   Problem: Malnutrition  (NI-5.2) Goal: Food and/or  nutrient delivery Description: Individualized approach for food/nutrient provision. Outcome: Adequate for Discharge   Problem: Acute Rehab OT Goals (only OT should resolve) Goal: Pt. Will Perform Grooming Outcome: Adequate for Discharge Goal: Pt. Will Perform Lower Body Bathing Outcome: Adequate for Discharge Goal: Pt. Will Perform Lower Body Dressing Outcome: Adequate for Discharge Goal: Pt. Will Transfer To Toilet Outcome: Adequate for Discharge Goal: Pt. Will Perform Toileting-Clothing Manipulation Outcome: Adequate for Discharge Goal: OT Additional ADL Goal #1 Outcome: Adequate for Discharge   Problem: Acute Rehab OT Goals (only OT should resolve) Goal: Pt. Will Perform Grooming Outcome: Adequate for Discharge Goal: Pt. Will Perform Lower Body Bathing Outcome: Adequate for Discharge Goal: Pt. Will Perform Lower Body Dressing Outcome: Adequate for Discharge Goal: Pt. Will Transfer To Toilet Outcome: Adequate for Discharge Goal: Pt. Will Perform Toileting-Clothing Manipulation Outcome: Adequate for Discharge Goal: OT Additional ADL Goal #1 Outcome: Adequate for Discharge

## 2021-12-12 NOTE — Plan of Care (Signed)
  Problem: Education: Goal: Knowledge of General Education information will improve Description Including pain rating scale, medication(s)/side effects and non-pharmacologic comfort measures Outcome: Progressing   Problem: Health Behavior/Discharge Planning: Goal: Ability to manage health-related needs will improve Outcome: Progressing   

## 2021-12-13 ENCOUNTER — Other Ambulatory Visit: Payer: Self-pay | Admitting: *Deleted

## 2021-12-13 ENCOUNTER — Other Ambulatory Visit: Payer: Self-pay

## 2021-12-13 ENCOUNTER — Telehealth: Payer: Self-pay

## 2021-12-13 DIAGNOSIS — I10 Essential (primary) hypertension: Secondary | ICD-10-CM | POA: Diagnosis not present

## 2021-12-13 DIAGNOSIS — L97419 Non-pressure chronic ulcer of right heel and midfoot with unspecified severity: Secondary | ICD-10-CM | POA: Diagnosis not present

## 2021-12-13 DIAGNOSIS — E11621 Type 2 diabetes mellitus with foot ulcer: Secondary | ICD-10-CM | POA: Diagnosis not present

## 2021-12-13 DIAGNOSIS — J449 Chronic obstructive pulmonary disease, unspecified: Secondary | ICD-10-CM | POA: Diagnosis not present

## 2021-12-13 DIAGNOSIS — E1142 Type 2 diabetes mellitus with diabetic polyneuropathy: Secondary | ICD-10-CM | POA: Diagnosis not present

## 2021-12-13 DIAGNOSIS — E1110 Type 2 diabetes mellitus with ketoacidosis without coma: Secondary | ICD-10-CM | POA: Diagnosis not present

## 2021-12-13 LAB — CULTURE, BLOOD (ROUTINE X 2)
Culture: NO GROWTH
Culture: NO GROWTH
Special Requests: ADEQUATE

## 2021-12-13 NOTE — Patient Outreach (Signed)
  Care Coordination   12/13/2021 Name: [name] MRN: [id number] DOB: [birth year]   Care Coordination Outreach Attempts:  An unsuccessful telephone outreach was attempted today to offer the patient information about available care coordination services as a benefit of their health plan.   Follow Up Plan:  Additional outreach attempts will be made to offer the patient care coordination information and services.   Encounter Outcome:  No Answer  Care Coordination Interventions Activated:  No   Care Coordination Interventions:  No, not indicated    SIG Shateka Petrea L. Lavina Hamman, RN, BSN, [place] Coordinator Office number [phone]

## 2021-12-13 NOTE — Telephone Encounter (Signed)
Transition Care Management Follow-up Telephone Call Date of discharge and from where: 12/12/2021, [facility] How have you been since you were released from the hospital? same Any questions or concerns? No  Items Reviewed: Did the pt receive and understand the discharge instructions provided? Yes  Medications obtained and verified? Yes  Other? No  Any new allergies since your discharge? No  Dietary orders reviewed? Yes Do you have support at home? Yes   Home Care and Equipment/Supplies: Were home health services ordered? not applicable If so, what is the name of the agency? N/a  Has the agency set up a time to come to the patient's home? yes Were any new equipment or medical supplies ordered?  No What is the name of the medical supply agency? N/a Were you able to get the supplies/equipment? not applicable Do you have any questions related to the use of the equipment or supplies? No  Functional Questionnaire: (I = Independent and D = Dependent) ADLs: D  Bathing/Dressing- D  Meal Prep- D  Eating- I  Maintaining continence- D  Transferring/Ambulation- D  Managing Meds- D  Follow up appointments reviewed:  PCP Hospital f/u appt confirmed? Yes  Scheduled to see Wardell Honour, MD  on 12/20/2021 @ [facility] f/u appt confirmed? No  Scheduled to see N/A on N/A @ N/A. Are transportation arrangements needed? No  If their condition worsens, is the pt aware to call PCP or go to the Emergency Dept.? Yes Was the patient provided with contact information for the PCP's office or ED? Yes Was to pt encouraged to call back with questions or concerns? Yes

## 2021-12-13 NOTE — Patient Outreach (Signed)
[facility]) Care Management  12/13/2021  SHYLIE POLO 1935/06/12 [id number]   [place] coordination- collaboration with [facility] staff  Collaborated with [facility] hospital liaison, SW and leadership related to patient progression    Plan [facility] RN CM will follow up with patient within the next 30 business days   Ketan Renz L. Lavina Hamman, RN, BSN, [place] Coordinator Office number [phone]

## 2021-12-14 ENCOUNTER — Other Ambulatory Visit: Payer: Self-pay

## 2021-12-14 ENCOUNTER — Ambulatory Visit: Payer: Self-pay | Admitting: *Deleted

## 2021-12-14 NOTE — Patient Outreach (Signed)
  Care Coordination   12/14/2021 Name: [name] MRN: [id number] DOB: 1936/04/15   Care Coordination Outreach Attempts:  A second unsuccessful outreach was attempted today to offer the patient with information about available care coordination services as a benefit of their health plan.     Follow Up Plan:  Additional outreach attempts will be made to offer the patient care coordination information and services.   Encounter Outcome:  No Answer  Care Coordination Interventions Activated:  No   Care Coordination Interventions:  No, not indicated    SIG    Pricsilla Lindvall L. Lavina Hamman, RN, BSN, [place] Coordinator Office number [phone]

## 2021-12-14 NOTE — Patient Outreach (Signed)
[facility]) Care Management  12/14/2021  ALEXAH KIVETT [birth year] [id number]  Referral for review:  Readmission Report  [facility] [ACO] Patient: Medicare [place]  Review done per request.  For questions,  Natividad Brood, RN BSN [facility] Liaison  [phone] business mobile phone Toll free office [phone]  Fax number: [phone] [email or web address]

## 2021-12-14 NOTE — Patient Outreach (Signed)
[facility]) Care Management  12/14/2021  [name] 07/10/1935 [id number]   [place] coordination- collaboration with [facility] staff   Collaborated with [facility] hospital liaison and leadership related to patient progression   2 recent unsuccessful outreaches post hospital discharge    Plan [facility] RN CM will follow up with patient within the next 3-7 business days     [name] L. Lavina Hamman, RN, BSN, [place] Coordinator Office number [phone]

## 2021-12-16 ENCOUNTER — Encounter: Payer: Self-pay | Admitting: *Deleted

## 2021-12-16 ENCOUNTER — Ambulatory Visit: Payer: Self-pay | Admitting: *Deleted

## 2021-12-16 NOTE — Patient Outreach (Signed)
  Care Coordination   12/16/2021 Name: [name] MRN: [id number] DOB: 07-Jun-1935   Care Coordination Outreach Attempts:  A third unsuccessful outreach was attempted today to offer the patient with information about available care coordination services as a benefit of their health plan.   Follow Up Plan:  No further outreach attempts will be made at this time. We have been unable to contact the patient to offer or enroll patient in care coordination services  Encounter Outcome:  No Answer  Care Coordination Interventions Activated:  Yes   Care Coordination Interventions:  Yes, provided    EPIC in basket message sent to pcp to notify of unable to reach patient with noted successful outreach from Transition of care staff with pending hospital follow scheduled on 12/20/21 at 1 pm   Outreach to [facility] [phone] indicates the patient is active with home health nurse since 12/13/21, last seen on 12/16/21. Basilia Jumbo confirms patient to be seen by home health PT/OT on 12/19/21, 12/20/21  Consulted [facility] leadership, collaborated with [facility] hospital liaison 12/16/21   SIG Versia Mignogna L. Lavina Hamman, RN, BSN, [place] Coordinator Office number [phone]

## 2021-12-20 ENCOUNTER — Encounter: Payer: Medicare Other | Admitting: Family Medicine

## 2021-12-20 DIAGNOSIS — E1110 Type 2 diabetes mellitus with ketoacidosis without coma: Secondary | ICD-10-CM | POA: Diagnosis not present

## 2021-12-20 DIAGNOSIS — E1142 Type 2 diabetes mellitus with diabetic polyneuropathy: Secondary | ICD-10-CM | POA: Diagnosis not present

## 2021-12-20 DIAGNOSIS — I10 Essential (primary) hypertension: Secondary | ICD-10-CM | POA: Diagnosis not present

## 2021-12-20 DIAGNOSIS — E11621 Type 2 diabetes mellitus with foot ulcer: Secondary | ICD-10-CM | POA: Diagnosis not present

## 2021-12-20 DIAGNOSIS — L97419 Non-pressure chronic ulcer of right heel and midfoot with unspecified severity: Secondary | ICD-10-CM | POA: Diagnosis not present

## 2021-12-20 DIAGNOSIS — J449 Chronic obstructive pulmonary disease, unspecified: Secondary | ICD-10-CM | POA: Diagnosis not present

## 2021-12-20 NOTE — Telephone Encounter (Signed)
error 

## 2021-12-22 ENCOUNTER — Telehealth: Payer: Self-pay | Admitting: *Deleted

## 2021-12-22 DIAGNOSIS — E1110 Type 2 diabetes mellitus with ketoacidosis without coma: Secondary | ICD-10-CM | POA: Diagnosis not present

## 2021-12-22 DIAGNOSIS — L97419 Non-pressure chronic ulcer of right heel and midfoot with unspecified severity: Secondary | ICD-10-CM | POA: Diagnosis not present

## 2021-12-22 DIAGNOSIS — E11621 Type 2 diabetes mellitus with foot ulcer: Secondary | ICD-10-CM | POA: Diagnosis not present

## 2021-12-22 DIAGNOSIS — I10 Essential (primary) hypertension: Secondary | ICD-10-CM | POA: Diagnosis not present

## 2021-12-22 DIAGNOSIS — J449 Chronic obstructive pulmonary disease, unspecified: Secondary | ICD-10-CM | POA: Diagnosis not present

## 2021-12-22 DIAGNOSIS — E1142 Type 2 diabetes mellitus with diabetic polyneuropathy: Secondary | ICD-10-CM | POA: Diagnosis not present

## 2021-12-22 NOTE — Telephone Encounter (Signed)
[name] with [facility] called and stated that she was just calling with Changes in Wound Condition to Right Foot.   Stated that the wound is now opened 1cmX0.8 and .2deep.  No Infection.   Only has orders to cover the wound with Foam Dressing.   Please Advise. (Forwarded to Dinah due to Dr. Sabra Heck out of office)

## 2021-12-22 NOTE — Telephone Encounter (Signed)
Cleanse wound with saline,pat dry and apply small amounts of Hydrogel and cover with foam dressing for absorption and extra protection.change dressing every 3 days and PRN if soiled.Notify provider for any signs of infection or if wound worsen.

## 2021-12-23 NOTE — Telephone Encounter (Signed)
Caryl Pina Notified and agreed.

## 2021-12-26 ENCOUNTER — Other Ambulatory Visit: Payer: Self-pay | Admitting: *Deleted

## 2021-12-26 MED ORDER — ALBUTEROL SULFATE HFA 108 (90 BASE) MCG/ACT IN AERS: 1.0000 | INHALATION_SPRAY | Freq: Four times a day (QID) | RESPIRATORY_TRACT | 6 refills | Status: AC | PRN

## 2021-12-26 NOTE — Patient Outreach (Addendum)
[facility]) Care Management  12/26/2021  [name] 01-05-1936 [id number]   Case closure   Unable to reach patient x 3 Notes were sent to pcp via [place] : [place] of Care  Updates made by Barbaraann Faster, RN since 12/26/2021 12:00 AM  Completed 12/26/2021   Problem: Complex Care Coordination Needs and disease management in patient with DM Resolved 12/26/2021  Priority: High  Onset Date: 10/24/2021     Long-Range Goal: Establish Plan of Care for Management Complex SDOH Barriers, disease management and Care Coordination DM Completed 12/26/2021  Start Date: 10/24/2021  This Visit's Progress: Not on track  Recent Progress: Not on track  Priority: High  Note:   Current Barriers:  Knowledge Deficits related to plan of care for management of DMII  Care Coordination needs related to Lacks knowledge of community resource: medicaid transportation Barriers: No family, live alone, No CAP provider, rambles/talks at a fast pace/talkative. ? Hard of hearing 10/24/21 Pt disconnected the line as she reports she was getting upset Last admission 10/02/21 to 10/07/21 (DKA) then to [facility] snf until 10/18/21   RN CM Clinical Goal(s):  Patient will verbalize understanding of plan for management of DMII as evidenced by lowered cbg values and/or lowered HgA1c  through collaboration with RN Care manager, provider, and care team.   Interventions: Outreaches for care coordination, disease management, resources, home care/education needs Inter-disciplinary care team collaboration (see longitudinal plan of care) Evaluation of current treatment plan related to  self management and patient's adherence to plan as established by provider 10/24/21 oriented pt to time- day time  12/26/21 closed after unable to reach with 3 unsuccessful calls on 12/13/21, 12/14/21, 12/16/21   Diabetes Interventions:  (Status:  Goal on track:  NO.) Long Term Goal Assessed patient's understanding  of A1c goal: <7% Discussed plans with patient for ongoing care management follow up and provided patient with direct contact information for care management team Review of patient status, including review of consultants reports, relevant laboratory and other test results, and medications completed Screening for signs and symptoms of depression related to chronic disease state  Assessed social determinant of health barriers Lab Results  Component Value Date   HGBA1C >15.5 (H) 10/03/2021   Interdisciplinary Collaboration Interventions:  (Status: Goal on track:  NO.) Short Term Goal   Collaborated with RN, PCP, [facility] SW to initiate plan of care to address needs related to Transportation, level of care, safety in patient with DMII, Dementia Collaboration with Wardell Honour, MD/ Ellwood Dense, NP prn RN CM inquired if she wanted home health and CAP services and she confirmed she did RN CM informed her x 3 in between of slight pauses that this RN CM discussed medicaid transportation with her in 2022 informed her that the medicaid transportation number was [phone] RN CM inquired if she had an appointment with Dr Sabra Heck, pcp and discussed the importance of getting an appointment with Dr Sabra Heck to obtain any orders for home health services or CAP services. 11/14/21 collaborated with [place] after pt d/c from hospital on 11/13/21 via cab after pt insisted on d/c and refused recommended snf placement. Outreach to [employer] via [employer] to request [facility] SW order and discuss pt safety and dementia/neurology evaluation.   Dementia:  (Status:  New goal.)  Long Term Goal Evaluation of current treatment plan related to misuse of: with behavioral disturbance Collaborated with Ellwood Dense, NP, Depression  screen completed, Consideration of in-home help encouraged , Discussed importance of discussing diagnosis with provider, and Discussed importance of attendance to all provider appointments   Patient  Goals/Self-Care Activities: Take all medications as prescribed Attend all scheduled provider appointments Attend church or other social activities Perform all self care activities independently  Perform IADL's (shopping, preparing meals, housekeeping, managing finances) independently Call provider office for new concerns or questions  Work with the social worker to address care coordination needs and will continue to work with the clinical team to address health care and disease management related needs  Follow Up Plan:  The patient has been provided with contact information for the care management team and has been advised to call with any health related questions or concerns.  No further follow up required: unable to reach for post hospital follow up after 3 outreach attempts case closure (consulted [facility] leadership 12/23/21)        [name] L. Lavina Hamman, RN, BSN, [place] Coordinator Office number [phone]

## 2021-12-27 DIAGNOSIS — L97419 Non-pressure chronic ulcer of right heel and midfoot with unspecified severity: Secondary | ICD-10-CM | POA: Diagnosis not present

## 2021-12-27 DIAGNOSIS — E1110 Type 2 diabetes mellitus with ketoacidosis without coma: Secondary | ICD-10-CM | POA: Diagnosis not present

## 2021-12-27 DIAGNOSIS — J449 Chronic obstructive pulmonary disease, unspecified: Secondary | ICD-10-CM | POA: Diagnosis not present

## 2021-12-27 DIAGNOSIS — E11621 Type 2 diabetes mellitus with foot ulcer: Secondary | ICD-10-CM | POA: Diagnosis not present

## 2021-12-27 DIAGNOSIS — E1142 Type 2 diabetes mellitus with diabetic polyneuropathy: Secondary | ICD-10-CM | POA: Diagnosis not present

## 2021-12-27 DIAGNOSIS — I10 Essential (primary) hypertension: Secondary | ICD-10-CM | POA: Diagnosis not present

## 2021-12-28 ENCOUNTER — Ambulatory Visit (INDEPENDENT_AMBULATORY_CARE_PROVIDER_SITE_OTHER): Payer: Medicare Other | Admitting: Family

## 2021-12-28 ENCOUNTER — Encounter: Payer: Self-pay | Admitting: Family

## 2021-12-28 ENCOUNTER — Ambulatory Visit
Admission: RE | Admit: 2021-12-28 | Discharge: 2021-12-28 | Disposition: A | Discharge status: Home / Self Care | Payer: Medicare Other | Source: Ambulatory Visit | Attending: Family | Admitting: Family

## 2021-12-28 VITALS — BP 110/58 | HR 66 | Temp 97.5°F | Resp 16 | Ht 63.0 in | Wt 123.0 lb

## 2021-12-28 DIAGNOSIS — M25472 Effusion, left ankle: Secondary | ICD-10-CM

## 2021-12-28 DIAGNOSIS — I639 Cerebral infarction, unspecified: Secondary | ICD-10-CM

## 2021-12-28 DIAGNOSIS — R600 Localized edema: Secondary | ICD-10-CM | POA: Diagnosis not present

## 2021-12-28 DIAGNOSIS — S81802A Unspecified open wound, left lower leg, initial encounter: Secondary | ICD-10-CM | POA: Diagnosis not present

## 2021-12-28 NOTE — Patient Instructions (Addendum)
-   Please get left ankle  X-ray at [place] at [facility] then will call you with results. - Take extra strength Tylenol every 8 hrs as needed for pain   cleanse left ankle /heel wound with saline ,pat dry, triple antibiotic ointment applied and covered with foam dressing for extra protection and absorption.wrap from base of toes to  below the knee to change dressing every 3 days.   - keep legs elevated when seated.   - wear knee high compression stockings on right leg on in the morning and off at bedtime

## 2021-12-28 NOTE — Progress Notes (Signed)
Provider: Algie Westry FNP-C  Wardell Honour, MD  Patient Care Team: Wardell Honour, MD as PCP - General (Family Medicine) Evans Lance, MD as PCP - Electrophysiology (Cardiology) Raynelle Bring, MD as Consulting Physician (Urology) Evans Lance, MD as Consulting Physician (Cardiology) Deneise Lever, MD as Consulting Physician (Pulmonary Disease) [facility] [name], MD as Consulting Physician (Endocrinology) Newt Minion, MD as Consulting Physician (Orthopedic Surgery) Raynelle Bring, MD as Consulting Physician (Urology)  Extended Emergency Contact Information Primary Emergency Contact: Schulman,Chris Mobile Phone: [phone] Relation: Friend Secondary Emergency Contact: [place] Mobile Phone: [phone] Relation: Friend Preferred language: [place] Interpreter needed? No  Code Status:  Full Code  Goals of care: Advanced Directive information    01/04/2022   11:25 AM  Advanced Directives  Does Patient Have a Medical Advance Directive? No  Would patient like information on creating a medical advance directive? No - Patient declined     Chief Complaint  Patient presents with   Acute Visit    Patient is here for left foot swelling    HPI:  Pt is a 86 y.o. female seen today for an acute visit for evaluation of left foot swelling x 1 week.  Also states has open wound on the back of the heel-ankle area has been draining.  Also has an open wound on right lateral foot that her caregiver has been changing dressing no drainage reported.  She denies any redness or tenderness on wound.  Left foot swollen unable to put in a shoe.  Unclear what caused the wounds states has cats in the house usually scratches her leg.   Past Medical History:  Diagnosis Date   Acute ischemic stroke ([place]) 08/10/2021   Anxiety disorder, unspecified    Arthritis    Benign paroxysmal positional vertigo 08/09/2012   Chronic rhinitis    Cognitive  communication deficit    Constipation, unspecified    COPD mixed type ([place])    on home O2   Diverticulosis    Esophageal stricture    Essential (primary) hypertension    Gastro-esophageal reflux disease without esophagitis    Glaucoma    Hiatal hernia    Hyperlipidemia, unspecified    Irritable bowel syndrome    Ischemic stroke ([place]) 08/09/2021   Presence of permanent cardiac pacemaker 01/14/2020   Spinal stenosis, lumbar region, without neurogenic claudication    Steatohepatitis    Thrombocytopenia, unspecified ([place])    Type 2 diabetes mellitus with diabetic polyneuropathy ([place])    Past Surgical History:  Procedure Laterality Date   APPENDECTOMY     CARPAL TUNNEL RELEASE     right hand   CHOLECYSTECTOMY OPEN  1978   COLONOSCOPY  07-2001   mild diverticulosis   ESOPHAGOGASTRODUODENOSCOPY  6734,19-37   H Hernia,es.stricture s/p dil 5F   INTRAOCULAR LENS INSERTION Bilateral    LIVER BIOPSY  01-239   PACEMAKER IMPLANT N/A 01/14/2020   Procedure: PACEMAKER IMPLANT;  Surgeon: Evans Lance, MD;  Location: [facility];  Service: Cardiovascular;  Laterality: N/A;   PARATHYROID EXPLORATION     TONSILLECTOMY AND ADENOIDECTOMY     TOTAL ABDOMINAL HYSTERECTOMY     ULNAR NERVE TRANSPOSITION  12/07/2011   Procedure: ULNAR NERVE DECOMPRESSION/TRANSPOSITION;this was cancelled-not done  Surgeon: Cammie Sickle., MD;  Location: [facility];  Service: Orthopedics;  Laterality: Right;  right ulnar nerve in situ decompression   ULNAR TUNNEL RELEASE  03/07/2012   Procedure: CUBITAL TUNNEL RELEASE;  Surgeon: Roseanne Kaufman, MD;  Location: [facility];  Service: Orthopedics;  Laterality: Right;  ulnar nerve release at the elbow      Allergies  Allergen Reactions   Chlordiazepoxide-Clidinium Other (See Comments)    Sleepy,weak   Biaxin [Clarithromycin] Other (See Comments)    Foul taste, abd pain, diarrhea   Tape Other (See Comments)    SKIN IS VERY THIN  AND WILL TEAR AND BRUISE EASILY!!   Ultram [Tramadol] Other (See Comments)    Caused tremors   Codeine Other (See Comments)    Upset the stomach    Outpatient Encounter Medications as of 12/28/2021  Medication Sig   acetaminophen (TYLENOL) 325 MG tablet Take 2 tablets (650 mg total) by mouth every 6 (six) hours as needed for mild pain (or Fever >/= 101).   albuterol (VENTOLIN HFA) 108 (90 Base) MCG/ACT inhaler Inhale 1-2 puffs into the lungs every 6 (six) hours as needed for wheezing or shortness of breath.   ALPRAZolam (XANAX) 0.5 MG tablet Take 0.25 mg by mouth daily as needed for anxiety.   aspirin EC 81 MG tablet Take 81 mg by mouth daily. (Patient not taking: Reported on 01/04/2022)   busPIRone (BUSPAR) 5 MG tablet Take 5 mg by mouth 3 (three) times daily.   clopidogrel (PLAVIX) 75 MG tablet Take 1 tablet (75 mg total) by mouth daily.   feeding supplement (ENSURE ENLIVE / ENSURE PLUS) LIQD Take 237 mLs by mouth 2 (two) times daily between meals.   fluorometholone (FML) 0.1 % ophthalmic suspension Place 1 drop into both eyes 3 (three) times daily.   fluticasone (FLOVENT HFA) 110 MCG/ACT inhaler Inhale 1 puff into the lungs 2 (two) times daily.   furosemide (LASIX) 20 MG tablet Take 20 mg by mouth daily.   insulin aspart (NOVOLOG FLEXPEN) 100 UNIT/ML FlexPen Inject 5 Units into the skin 3 (three) times daily with meals.   insulin degludec (TRESIBA FLEXTOUCH) 200 UNIT/ML FlexTouch Pen Inject 26 Units into the skin at bedtime.   ipratropium-albuterol (DUONEB) 0.5-2.5 (3) MG/3ML SOLN 1 vial in neb every 8 hours and as needed   irbesartan (AVAPRO) 75 MG tablet Take 75 mg by mouth daily.   metoprolol succinate (TOPROL-XL) 50 MG 24 hr tablet TAKE ONE TABLET TWICE A DAY WITH OR IMMEDIATELY FOLLOWING A MEAL.   mirabegron ER (MYRBETRIQ) 50 MG TB24 tablet Take 1 tablet (50 mg total) by mouth daily.   Multiple Vitamin (MULTIVITAMIN WITH MINERALS) TABS tablet Take 1 tablet by mouth daily.    pantoprazole (PROTONIX) 40 MG tablet Take 1 tablet (40 mg total) by mouth daily.   pravastatin (PRAVACHOL) 20 MG tablet Take 1 tablet (20 mg total) by mouth daily.   thiamine 100 MG tablet Take 1 tablet (100 mg total) by mouth daily.   Tiotropium Bromide-Olodaterol (STIOLTO RESPIMAT) 2.5-2.5 MCG/ACT AERS Inhale 2 puffs into the lungs daily.   [DISCONTINUED] levalbuterol (XOPENEX HFA) 45 MCG/ACT inhaler INHALE 1-2 PUFFS EVERY 6 HOURS IF NEEDED FOR WHEEZING.   No facility-administered encounter medications on file as of 12/28/2021.    Review of Systems  Respiratory:  Negative for chest tightness, shortness of breath and wheezing.   Cardiovascular:  Positive for leg swelling. Negative for chest pain and palpitations.  Gastrointestinal:  Negative for abdominal distention, abdominal pain, nausea and vomiting.  Musculoskeletal:  Positive for arthralgias and gait problem. Negative for joint swelling and myalgias.  Skin:  Positive for wound. Negative for color change, pallor and rash.  Neurological:  Negative for dizziness, weakness, light-headedness, numbness and headaches.    Immunization History  Administered Date(s) Administered   Fluad Quad(high Dose 65+) 01/30/2019, 03/01/2020, 02/08/2021   Influenza Split 01/07/2011, 02/22/2012   Influenza Whole 05/09/2007, 02/05/2010, 01/22/2017   Influenza, High Dose Seasonal PF 02/06/2018   Influenza,inj,Quad PF,6+ Mos 01/21/2013, 03/18/2014, 01/08/2015, 01/13/2016   Janssen (J&J) SARS-COV-2 Vaccination 01/23/2020   Moderna SARS-COV2 Booster Vaccination 05/26/2020, 11/15/2020   Pneumococcal Conjugate-13 11/24/2013   Pneumococcal Polysaccharide-23 04/23/2012   Pertinent  Health Maintenance Due  Topic Date Due   DEXA SCAN  Never done   OPHTHALMOLOGY EXAM  12/28/2018   FOOT EXAM  09/01/2021   INFLUENZA VACCINE  12/06/2021   HEMOGLOBIN A1C  04/05/2022      12/10/2021    8:00 PM 12/11/2021    9:05 AM 12/12/2021    3:53 AM 12/12/2021    8:00 AM  01/04/2022   11:24 AM  Fall Risk  Falls in the past year?     1  Was there an injury with Fall?     0  Fall Risk Category Calculator     1  Fall Risk Category     Low  Patient Fall Risk Level High fall risk High fall risk High fall risk High fall risk Low fall risk  Patient at Risk for Falls Due to     History of fall(s)  Fall risk Follow up     Falls evaluation completed   Functional Status Survey:    Vitals:   12/28/21 1047  BP: (!) 110/58  Pulse: 66  Resp: 16  Temp: (!) 97.5 F (36.4 C)  TempSrc: Temporal  SpO2: 95%  Weight: 123 lb (55.8 kg)  Height: 5' 3"  (1.6 m)   Body mass index is 21.79 kg/m. Physical Exam Vitals reviewed.  Constitutional:      General: She is not in acute distress.    Appearance: Normal appearance. She is normal weight. She is not ill-appearing or diaphoretic.  HENT:     Head: Normocephalic.     Nose: Nose normal. No congestion or rhinorrhea.     Mouth/Throat:     Mouth: Mucous membranes are moist.     Pharynx: Oropharynx is clear. No oropharyngeal exudate or posterior oropharyngeal erythema.  Eyes:     General: No scleral icterus.       Right eye: No discharge.        Left eye: No discharge.     Conjunctiva/sclera: Conjunctivae normal.     Pupils: Pupils are equal, round, and reactive to light.  Neck:     Vascular: No carotid bruit.  Cardiovascular:     Rate and Rhythm: Normal rate and regular rhythm.     Pulses: Normal pulses.     Heart sounds: Normal heart sounds. No murmur heard.    No friction rub. No gallop.  Pulmonary:     Effort: Pulmonary effort is normal. No respiratory distress.     Breath sounds: Normal breath sounds. No wheezing, rhonchi or rales.  Chest:     Chest wall: No tenderness.  Abdominal:     General: Bowel sounds are normal. There is no distension.     Palpations: Abdomen is soft. There is no mass.     Tenderness: There is no abdominal tenderness. There is no right CVA tenderness, left CVA tenderness, guarding  or rebound.  Musculoskeletal:        General: No swelling or tenderness. Normal range of motion.  Cervical back: Normal range of motion. No rigidity or tenderness.     Right lower leg: No edema.     Left lower leg: No edema.  Lymphadenopathy:     Cervical: No cervical adenopathy.  Skin:    General: Skin is warm and dry.     Coloration: Skin is not pale.     Findings: No bruising, erythema, lesion or rash.     Comments: Left leg ankle -achilles area linear scabbed wound without any drainage, erythema, or odor.cleanse with saline ,pat dry, triple antibiotic ointment applied and covered with foam dressing for extra protection and absorption.change dressing    Neurological:     Mental Status: She is alert and oriented to person, place, and time.     Motor: No weakness.     Gait: Gait abnormal.  Psychiatric:        Mood and Affect: Mood normal.        Speech: Speech normal.        Behavior: Behavior normal.     Labs reviewed: Recent Labs    10/03/21 0329 10/03/21 0731 12/08/21 1743 12/08/21 1753 12/09/21 0231 12/10/21 0418  NA 140   < > 136 136 139 134*  K 4.1   < > 4.2 4.3 3.9 3.9  CL 106   < > 92*  --  100 93*  CO2 17*   < > 27  --  28 29  GLUCOSE 253*   < > 221*  --  113* 177*  BUN 28*   < > 34*  --  30* 19  CREATININE 1.27*   < > 1.13*  --  0.83 0.61  CALCIUM 9.2   < > 10.7*  --  9.6 9.2  MG 1.8  --   --   --   --   --    < > = values in this interval not displayed.   Recent Labs    11/09/21 1532 12/08/21 1743 12/09/21 0231  AST 17 17 15   ALT 11 14 10   ALKPHOS 63 79 61  BILITOT 1.8* 0.9 0.7  PROT 6.6 8.9* 7.1  ALBUMIN 3.3* 4.6 3.5   Recent Labs    10/28/21 1217 10/28/21 1226 11/09/21 1330 11/09/21 1340 12/08/21 1743 12/08/21 1753 12/09/21 0231 12/10/21 0055  WBC 7.0  --  18.3*   < > 12.7*  --  12.2* 7.3  NEUTROABS 5.0  --  16.3*  --  8.8*  --   --   --   HGB 14.9   < > 17.3*   < > 18.6* 20.1* 17.1* 15.0  HCT 46.5*   < > 55.5*   < > 56.8* 59.0*  52.1* 45.9  MCV 91.5  --  92.7   < > 89.9  --  90.0 90.5  PLT 189  --  336   < > 335  --  233 158   < > = values in this interval not displayed.   Lab Results  Component Value Date   TSH 1.621 08/10/2021   Lab Results  Component Value Date   HGBA1C >15.5 (H) 10/03/2021   Lab Results  Component Value Date   CHOL 90 08/09/2021   HDL 38 (L) 08/09/2021   LDLCALC 42 08/09/2021   LDLDIRECT 89.0 10/14/2014   TRIG 50 08/09/2021   CHOLHDL 2.4 08/09/2021    Significant Diagnostic Results in last 30 days:  DG Ankle Complete Left  Result Date: 12/29/2021 CLINICAL DATA:  Ankle pain and swelling  EXAM: LEFT ANKLE COMPLETE - 3+ VIEW COMPARISON:  None Available. FINDINGS: No fracture or malalignment. Bandage artifact over the lateral and posterior aspect of the ankle. Mortise is symmetric. Generalized soft tissue edema. IMPRESSION: No acute osseous abnormality.  Soft tissue swelling Electronically Signed   By: Donavan Foil M.D.   On: 12/29/2021 23:40   CT Head Wo Contrast  Result Date: 12/08/2021 CLINICAL DATA:  Altered mental status EXAM: CT HEAD WITHOUT CONTRAST TECHNIQUE: Contiguous axial images were obtained from the base of the skull through the vertex without intravenous contrast. RADIATION DOSE REDUCTION: This exam was performed according to the departmental dose-optimization program which includes automated exposure control, adjustment of the mA and/or kV according to patient size and/or use of iterative reconstruction technique. COMPARISON:  11/09/2021 FINDINGS: Brain: No acute intracranial findings are seen. There are no signs of bleeding within the cranium. Cortical sulci are prominent. There is decreased density in periventricular and subcortical white matter. Vascular: Scattered arterial calcifications are seen. Skull: Unremarkable. Sinuses/Orbits: Unremarkable. Other: No significant interval changes are noted. IMPRESSION: No acute intracranial findings are seen in noncontrast CT brain.  Atrophy. Small-vessel disease. No significant interval changes are noted. Electronically Signed   By: Elmer Picker M.D.   On: 12/08/2021 19:19   DG Chest Portable 1 View  Result Date: 12/08/2021 CLINICAL DATA:  Weakness. EXAM: PORTABLE CHEST 1 VIEW COMPARISON:  Chest x-ray dated November 09, 2021. FINDINGS: Unchanged left chest wall pacemaker. The heart size and mediastinal contours are within normal limits. Normal pulmonary vascularity. No focal consolidation, pleural effusion, or pneumothorax. No acute osseous abnormality. IMPRESSION: 1. No active disease. Electronically Signed   By: Titus Dubin M.D.   On: 12/08/2021 18:02    Assessment/Plan  1. Left ankle swelling Possible due to recent wound on ankle -achilles area  Tender with ROM  - will obtain imaging 1-2 + edema  - DG Ankle Complete Left; Future  2. Bilateral leg edema - will obtain imaging 1-2 + edema  - no dyspnea noted or signs of fluid overload  - Compression stockings  3. Wound of left lower extremity, initial encounter Afebrile  Left leg ankle -achilles area linear wound without any drainage, erythema, or odor.cleanse with saline ,pat dry, triple antibiotic ointment applied and covered with foam dressing for extra protection and absorption.change dressing Advised to change dressing every 3 days.  - HHN recommended but declined states her care giver will change dressing.  Family/ staff Communication: Reviewed plan of care with patient verbalized understanding   Labs/tests ordered:  - DG Ankle Complete Left; Future  Next Appointment: Return if symptoms worsen or fail to improve.   Sandrea Hughs, NP

## 2021-12-30 ENCOUNTER — Other Ambulatory Visit: Payer: Self-pay | Admitting: *Deleted

## 2021-12-30 MED ORDER — LEVALBUTEROL TARTRATE 45 MCG/ACT IN AERO: 1.0000 | INHALATION_SPRAY | Freq: Four times a day (QID) | RESPIRATORY_TRACT | 12 refills | Status: AC | PRN

## 2022-01-02 ENCOUNTER — Telehealth: Payer: Self-pay | Admitting: Internal Medicine

## 2022-01-02 ENCOUNTER — Telehealth: Payer: Self-pay

## 2022-01-02 DIAGNOSIS — S91302D Unspecified open wound, left foot, subsequent encounter: Secondary | ICD-10-CM

## 2022-01-02 DIAGNOSIS — S91301D Unspecified open wound, right foot, subsequent encounter: Secondary | ICD-10-CM

## 2022-01-02 NOTE — Telephone Encounter (Signed)
Patient's caregiver states that patient has open wound on both feet right worse than left. Right has pus coming out of it and left has red spots and painful. Patient states bandages hurt her feet. Patient has appointment scheduled for 01/03/22 with Marlowe Sax, NP. Patient does not want to come in today.  Call [name] (Caregiver)  [phone]  Message routed to Marlowe Sax, NP

## 2022-01-02 NOTE — Telephone Encounter (Signed)
Will order Home health Nurse for wound care management

## 2022-01-03 ENCOUNTER — Encounter: Payer: Medicare Other | Admitting: Family

## 2022-01-03 NOTE — Telephone Encounter (Signed)
Called and spoke with patient.  Patient stated she was wanting to see if there is a cheaper inhaler in place of Stiolto.   Patient is scheduled 1 year follow up 01/17/22 at 0900.  Message routed to Dr. Annamaria Boots to advise

## 2022-01-04 ENCOUNTER — Ambulatory Visit: Payer: Medicare Other | Admitting: Family Medicine

## 2022-01-04 ENCOUNTER — Ambulatory Visit (INDEPENDENT_AMBULATORY_CARE_PROVIDER_SITE_OTHER): Payer: Medicare Other | Admitting: Family

## 2022-01-04 ENCOUNTER — Encounter: Payer: Self-pay | Admitting: Family

## 2022-01-04 VITALS — BP 110/70 | HR 113 | Temp 97.1°F | Resp 16 | Ht 63.0 in | Wt 126.2 lb

## 2022-01-04 DIAGNOSIS — I639 Cerebral infarction, unspecified: Secondary | ICD-10-CM | POA: Diagnosis not present

## 2022-01-04 DIAGNOSIS — L03116 Cellulitis of left lower limb: Secondary | ICD-10-CM | POA: Diagnosis not present

## 2022-01-04 MED ORDER — DOXYCYCLINE HYCLATE 100 MG PO TABS: 100.0000 mg | ORAL_TABLET | Freq: Two times a day (BID) | ORAL | 0 refills | Status: DC

## 2022-01-04 NOTE — Telephone Encounter (Signed)
ATC patient.  No answer and answering machine is full.  Unable to leave a message.  Will follow up and try again at a later.

## 2022-01-04 NOTE — Progress Notes (Signed)
Cellulitis   Provider: Favian Kittleson FNP-C   Wardell Honour, MD  Patient Care Team: Wardell Honour, MD as PCP - General (Family Medicine) Evans Lance, MD as PCP - Electrophysiology (Cardiology) Raynelle Bring, MD as Consulting Physician (Urology) Evans Lance, MD as Consulting Physician (Cardiology) Deneise Lever, MD as Consulting Physician (Pulmonary Disease) [facility] [name], MD as Consulting Physician (Endocrinology) Newt Minion, MD as Consulting Physician (Orthopedic Surgery) Raynelle Bring, MD as Consulting Physician (Urology)  Extended Emergency Contact Information Primary Emergency Contact: Schulman,Chris Mobile Phone: [phone] Relation: Friend Secondary Emergency Contact: [place] Mobile Phone: [phone] Relation: Friend Preferred language: Cleophus Molt Interpreter needed? No  Code Status:  Full Code  Goals of care: Advanced Directive information    01/04/2022   11:25 AM  Advanced Directives  Does Patient Have a Medical Advance Directive? No  Would patient like information on creating a medical advance directive? No - Patient declined     Chief Complaint  Patient presents with   Follow-up    Follow up on left ankle/heel wound. Patient caregiver states that patient feet are infected and patient has pus coming from feet.     HPI:  Pt is a 86 y.o. female seen today for left ankle wound.she is here with car giver who provider additional HPI information.states has multiple red areas that was draining pus.previous wound on left ankle and right foot have scabbed up but saw some pus on the left wound recently.No redness. Also has another area on right gluteal area that's red. Of note,patient has cats at home but states usually don't go out.Has had bed bugs in her apartment complex but none in hers. She denies any fever or chills.   Past Medical History:  Diagnosis Date   Acute ischemic stroke ([place]) 08/10/2021    Anxiety disorder, unspecified    Arthritis    Benign paroxysmal positional vertigo 08/09/2012   Chronic rhinitis    Cognitive communication deficit    Constipation, unspecified    COPD mixed type ([place])    on home O2   Diverticulosis    Esophageal stricture    Essential (primary) hypertension    Gastro-esophageal reflux disease without esophagitis    Glaucoma    Hiatal hernia    Hyperlipidemia, unspecified    Irritable bowel syndrome    Ischemic stroke ([place]) 08/09/2021   Presence of permanent cardiac pacemaker 01/14/2020   Spinal stenosis, lumbar region, without neurogenic claudication    Steatohepatitis    Thrombocytopenia, unspecified ([place])    Type 2 diabetes mellitus with diabetic polyneuropathy ([place])    Past Surgical History:  Procedure Laterality Date   APPENDECTOMY     CARPAL TUNNEL RELEASE     right hand   CHOLECYSTECTOMY OPEN  1978   COLONOSCOPY  07-2001   mild diverticulosis   ESOPHAGOGASTRODUODENOSCOPY  2330,07-62   H Hernia,es.stricture s/p dil 34F   INTRAOCULAR LENS INSERTION Bilateral    LIVER BIOPSY  [id number]   PACEMAKER IMPLANT N/A 01/14/2020   Procedure: PACEMAKER IMPLANT;  Surgeon: Evans Lance, MD;  Location: [place] [facility];  Service: Cardiovascular;  Laterality: N/A;   PARATHYROID EXPLORATION     TONSILLECTOMY AND ADENOIDECTOMY     TOTAL ABDOMINAL HYSTERECTOMY     ULNAR NERVE TRANSPOSITION  12/07/2011   Procedure: ULNAR NERVE DECOMPRESSION/TRANSPOSITION;this was cancelled-not done  Surgeon: Cammie Sickle., MD;  Location: [place];  Service: Orthopedics;  Laterality: Right;  right ulnar nerve in situ  decompression   ULNAR TUNNEL RELEASE  03/07/2012   Procedure: CUBITAL TUNNEL RELEASE;  Surgeon: Roseanne Kaufman, MD;  Location: [place];  Service: Orthopedics;  Laterality: Right;  ulnar nerve release at the elbow      Allergies  Allergen Reactions   Chlordiazepoxide-Clidinium Other (See Comments)    Sleepy,weak    Biaxin [Clarithromycin] Other (See Comments)    Foul taste, abd pain, diarrhea   Tape Other (See Comments)    SKIN IS VERY THIN AND WILL TEAR AND BRUISE EASILY!!   Ultram [Tramadol] Other (See Comments)    Caused tremors   Codeine Other (See Comments)    Upset the stomach    Allergies as of 01/04/2022       Reactions   Chlordiazepoxide-clidinium Other (See Comments)   Sleepy,weak   Biaxin [clarithromycin] Other (See Comments)   Foul taste, abd pain, diarrhea   Tape Other (See Comments)   SKIN IS VERY THIN AND WILL TEAR AND BRUISE EASILY!!   Ultram [tramadol] Other (See Comments)   Caused tremors   Codeine Other (See Comments)   Upset the stomach        Medication List        Accurate as of January 04, 2022 11:32 AM. If you have any questions, ask your nurse or doctor.          acetaminophen 325 MG tablet Commonly known as: TYLENOL Take 2 tablets (650 mg total) by mouth every 6 (six) hours as needed for mild pain (or Fever >/= 101).   albuterol 108 (90 Base) MCG/ACT inhaler Commonly known as: VENTOLIN HFA Inhale 1-2 puffs into the lungs every 6 (six) hours as needed for wheezing or shortness of breath.   ALPRAZolam 0.5 MG tablet Commonly known as: XANAX Take 0.25 mg by mouth daily as needed for anxiety.   aspirin EC 81 MG tablet Take 81 mg by mouth daily.   busPIRone 5 MG tablet Commonly known as: BUSPAR Take 5 mg by mouth 3 (three) times daily.   clopidogrel 75 MG tablet Commonly known as: PLAVIX Take 1 tablet (75 mg total) by mouth daily.   feeding supplement Liqd Take 237 mLs by mouth 2 (two) times daily between meals.   fluorometholone 0.1 % ophthalmic suspension Commonly known as: FML Place 1 drop into both eyes 3 (three) times daily.   fluticasone 110 MCG/ACT inhaler Commonly known as: FLOVENT HFA Inhale 1 puff into the lungs 2 (two) times daily.   furosemide 20 MG tablet Commonly known as: LASIX Take 20 mg by mouth daily.    ipratropium-albuterol 0.5-2.5 (3) MG/3ML Soln Commonly known as: DUONEB 1 vial in neb every 8 hours and as needed   irbesartan 75 MG tablet Commonly known as: AVAPRO Take 75 mg by mouth daily.   levalbuterol 45 MCG/ACT inhaler Commonly known as: XOPENEX HFA Inhale 1-2 puffs into the lungs every 6 (six) hours as needed for wheezing.   metoprolol succinate 50 MG 24 hr tablet Commonly known as: TOPROL-XL TAKE ONE TABLET TWICE A DAY WITH OR IMMEDIATELY FOLLOWING A MEAL.   mirabegron ER 50 MG Tb24 tablet Commonly known as: MYRBETRIQ Take 1 tablet (50 mg total) by mouth daily.   multivitamin with minerals Tabs tablet Take 1 tablet by mouth daily.   NovoLOG FlexPen 100 UNIT/ML FlexPen Generic drug: insulin aspart Inject 5 Units into the skin 3 (three) times daily with meals.   pantoprazole 40 MG tablet Commonly known as: Protonix Take 1 tablet (40  mg total) by mouth daily.   pravastatin 20 MG tablet Commonly known as: PRAVACHOL Take 1 tablet (20 mg total) by mouth daily.   Stiolto Respimat 2.5-2.5 MCG/ACT Aers Generic drug: Tiotropium Bromide-Olodaterol Inhale 2 puffs into the lungs daily.   thiamine 100 MG tablet Commonly known as: VITAMIN B1 Take 1 tablet (100 mg total) by mouth daily.   Tyler Aas FlexTouch 200 UNIT/ML FlexTouch Pen Generic drug: insulin degludec Inject 26 Units into the skin at bedtime.        Review of Systems  Constitutional:  Negative for appetite change, chills, fatigue, fever and unexpected weight change.  Respiratory:  Negative for cough, chest tightness, shortness of breath and wheezing.   Cardiovascular:  Negative for chest pain, palpitations and leg swelling.  Gastrointestinal:  Positive for vomiting.  Musculoskeletal:  Positive for arthralgias, back pain and gait problem. Negative for myalgias.  Skin:  Positive for rash. Negative for color change, pallor and wound.  Neurological:  Negative for dizziness, weakness, light-headedness,  numbness and headaches.  Psychiatric/Behavioral:  Negative for agitation, behavioral problems, confusion, hallucinations and sleep disturbance. The patient is not nervous/anxious.     Immunization History  Administered Date(s) Administered   Fluad Quad(high Dose 65+) 01/30/2019, 03/01/2020, 02/08/2021   Influenza Split 01/07/2011, 02/22/2012   Influenza Whole 05/09/2007, 02/05/2010, 01/22/2017   Influenza, High Dose Seasonal PF 02/06/2018   Influenza,inj,Quad PF,6+ Mos 01/21/2013, 03/18/2014, 01/08/2015, 01/13/2016   Janssen (J&J) SARS-COV-2 Vaccination 01/23/2020   Moderna SARS-COV2 Booster Vaccination 05/26/2020, 11/15/2020   Pneumococcal Conjugate-13 11/24/2013   Pneumococcal Polysaccharide-23 04/23/2012   Pertinent  Health Maintenance Due  Topic Date Due   DEXA SCAN  Never done   OPHTHALMOLOGY EXAM  12/28/2018   FOOT EXAM  09/01/2021   INFLUENZA VACCINE  12/06/2021   HEMOGLOBIN A1C  04/05/2022      12/10/2021    8:00 PM 12/11/2021    9:05 AM 12/12/2021    3:53 AM 12/12/2021    8:00 AM 01/04/2022   11:24 AM  Fall Risk  Falls in the past year?     1  Was there an injury with Fall?     0  Fall Risk Category Calculator     1  Fall Risk Category     Low  Patient Fall Risk Level High fall risk High fall risk High fall risk High fall risk Low fall risk  Patient at Risk for Falls Due to     History of fall(s)  Fall risk Follow up     Falls evaluation completed   Functional Status Survey:    Vitals:   01/04/22 1112  BP: 110/70  Pulse: (!) 113  Resp: 16  Temp: (!) 97.1 F (36.2 C)  SpO2: 92%  Weight: 126 lb 3.2 oz (57.2 kg)  Height: 5' 3"  (1.6 m)   Body mass index is 22.36 kg/m. Physical Exam Vitals reviewed.  Constitutional:      General: She is not in acute distress.    Appearance: Normal appearance. She is normal weight. She is not ill-appearing or diaphoretic.  HENT:     Head: Normocephalic.  Eyes:     General: No scleral icterus.       Right eye: No discharge.         Left eye: No discharge.     Conjunctiva/sclera: Conjunctivae normal.     Pupils: Pupils are equal, round, and reactive to light.  Cardiovascular:     Rate and Rhythm: Normal rate and regular rhythm.  Pulses: Normal pulses.     Heart sounds: Normal heart sounds. No murmur heard.    No friction rub. No gallop.  Pulmonary:     Effort: Pulmonary effort is normal. No respiratory distress.     Breath sounds: Normal breath sounds. No wheezing, rhonchi or rales.  Chest:     Chest wall: No tenderness.  Musculoskeletal:        General: No swelling or tenderness.     Comments: Left leg chronic edema > right   Skin:    General: Skin is warm and dry.     Coloration: Skin is not pale.     Findings: No bruising, erythema or lesion.     Comments: Multiple pea size open areas on lower leg red pussy.non-tender to touch.similar size also noted on right gluteal area .suspect possible insect bite. Area cleansed. Previous wound areas have healed and scabbed.foam dressing applied for protection.   Neurological:     Mental Status: She is alert. Mental status is at baseline.     Motor: No weakness.     Gait: Gait abnormal.  Psychiatric:        Mood and Affect: Mood normal.        Speech: Speech normal.        Behavior: Behavior normal.     Labs reviewed: Recent Labs    10/03/21 0329 10/03/21 0731 12/08/21 1743 12/08/21 1753 12/09/21 0231 12/10/21 0418  NA 140   < > 136 136 139 134*  K 4.1   < > 4.2 4.3 3.9 3.9  CL 106   < > 92*  --  100 93*  CO2 17*   < > 27  --  28 29  GLUCOSE 253*   < > 221*  --  113* 177*  BUN 28*   < > 34*  --  30* 19  CREATININE 1.27*   < > 1.13*  --  0.83 0.61  CALCIUM 9.2   < > 10.7*  --  9.6 9.2  MG 1.8  --   --   --   --   --    < > = values in this interval not displayed.   Recent Labs    11/09/21 1532 12/08/21 1743 12/09/21 0231  AST 17 17 15   ALT 11 14 10   ALKPHOS 63 79 61  BILITOT 1.8* 0.9 0.7  PROT 6.6 8.9* 7.1  ALBUMIN 3.3* 4.6 3.5    Recent Labs    10/28/21 1217 10/28/21 1226 11/09/21 1330 11/09/21 1340 12/08/21 1743 12/08/21 1753 12/09/21 0231 12/10/21 0055  WBC 7.0  --  18.3*   < > 12.7*  --  12.2* 7.3  NEUTROABS 5.0  --  16.3*  --  8.8*  --   --   --   HGB 14.9   < > 17.3*   < > 18.6* 20.1* 17.1* 15.0  HCT 46.5*   < > 55.5*   < > 56.8* 59.0* 52.1* 45.9  MCV 91.5  --  92.7   < > 89.9  --  90.0 90.5  PLT 189  --  336   < > 335  --  233 158   < > = values in this interval not displayed.   Lab Results  Component Value Date   TSH 1.621 08/10/2021   Lab Results  Component Value Date   HGBA1C >15.5 (H) 10/03/2021   Lab Results  Component Value Date   CHOL 90 08/09/2021   HDL 38 (  L) 08/09/2021   LDLCALC 42 08/09/2021   LDLDIRECT 89.0 10/14/2014   TRIG 50 08/09/2021   CHOLHDL 2.4 08/09/2021    Significant Diagnostic Results in last 30 days:  DG Ankle Complete Left  Result Date: 12/29/2021 CLINICAL DATA:  Ankle pain and swelling EXAM: LEFT ANKLE COMPLETE - 3+ VIEW COMPARISON:  None Available. FINDINGS: No fracture or malalignment. Bandage artifact over the lateral and posterior aspect of the ankle. Mortise is symmetric. Generalized soft tissue edema. IMPRESSION: No acute osseous abnormality.  Soft tissue swelling Electronically Signed   By: Donavan Foil M.D.   On: 12/29/2021 23:40   CT Head Wo Contrast  Result Date: 12/08/2021 CLINICAL DATA:  Altered mental status EXAM: CT HEAD WITHOUT CONTRAST TECHNIQUE: Contiguous axial images were obtained from the base of the skull through the vertex without intravenous contrast. RADIATION DOSE REDUCTION: This exam was performed according to the departmental dose-optimization program which includes automated exposure control, adjustment of the mA and/or kV according to patient size and/or use of iterative reconstruction technique. COMPARISON:  11/09/2021 FINDINGS: Brain: No acute intracranial findings are seen. There are no signs of bleeding within the cranium.  Cortical sulci are prominent. There is decreased density in periventricular and subcortical white matter. Vascular: Scattered arterial calcifications are seen. Skull: Unremarkable. Sinuses/Orbits: Unremarkable. Other: No significant interval changes are noted. IMPRESSION: No acute intracranial findings are seen in noncontrast CT brain. Atrophy. Small-vessel disease. No significant interval changes are noted. Electronically Signed   By: Elmer Picker M.D.   On: 12/08/2021 19:19   DG Chest Portable 1 View  Result Date: 12/08/2021 CLINICAL DATA:  Weakness. EXAM: PORTABLE CHEST 1 VIEW COMPARISON:  Chest x-ray dated November 09, 2021. FINDINGS: Unchanged left chest wall pacemaker. The heart size and mediastinal contours are within normal limits. Normal pulmonary vascularity. No focal consolidation, pleural effusion, or pneumothorax. No acute osseous abnormality. IMPRESSION: 1. No active disease. Electronically Signed   By: Titus Dubin M.D.   On: 12/08/2021 18:02    Assessment/Plan   Cellulitis of left lower extremity Afebrile  Multiple pea size open areas on lower leg red pussy.non-tender to touch.similar size also noted on right gluteal area .suspect possible insect bite. Area cleansed. Previous wound areas have healed and scabbed.foam dressing applied for protection.  - care giver advised to monitor red areas if worsen to apply dressing.  Start on doxycycline as below side effects discussed but states has taken antibioitics without any problems.  - doxycycline (VIBRA-TABS) 100 MG tablet; Take 1 tablet (100 mg total) by mouth 2 (two) times daily for 7 days.  Dispense: 14 tablet; Refill: 0  Family/ staff Communication: Reviewed plan of care with patient and care giver verbalized understanding   Labs/tests ordered: None   Next Appointment : Return if symptoms worsen or fail to improve.   Sandrea Hughs, NP

## 2022-01-04 NOTE — Telephone Encounter (Signed)
She can ask her pharmacist if Brynda Peon or [employer] would be cheaper with her insurance.

## 2022-01-05 NOTE — Progress Notes (Signed)
Remote pacemaker transmission.   

## 2022-01-05 NOTE — Telephone Encounter (Signed)
Called and spoke with patient.  Dr. Janee Morn recommendations given.  Patient states she was ok with keeping current inhaler.  Patient stated she would discuss at her upcoming 9/12 follow up. I did advise patient to bring inhalers she was currently taking to [facility], because patient stated she had other inhalers but was unsure of the names.

## 2022-01-10 ENCOUNTER — Inpatient Hospital Stay (HOSPITAL_COMMUNITY)
Admit: 2022-01-10 | Discharge: 2022-01-10 | Disposition: A | Discharge status: Home / Self Care | Payer: Medicare Other | Attending: Emergency Medicine | Admitting: Emergency Medicine

## 2022-01-10 ENCOUNTER — Other Ambulatory Visit: Payer: Self-pay

## 2022-01-10 ENCOUNTER — Emergency Department (HOSPITAL_COMMUNITY): Payer: Medicare Other

## 2022-01-10 ENCOUNTER — Telehealth: Payer: Self-pay

## 2022-01-10 ENCOUNTER — Inpatient Hospital Stay (HOSPITAL_COMMUNITY)
Admission: EM | Admit: 2022-01-10 | Discharge: 2022-01-13 | DRG: 637 | Disposition: A | Discharge status: Home / Self Care | Payer: Medicare Other | Attending: Internal Medicine | Admitting: Internal Medicine

## 2022-01-10 ENCOUNTER — Encounter (HOSPITAL_COMMUNITY): Payer: Self-pay | Admitting: Emergency Medicine

## 2022-01-10 DIAGNOSIS — Z7902 Long term (current) use of antithrombotics/antiplatelets: Secondary | ICD-10-CM

## 2022-01-10 DIAGNOSIS — E1142 Type 2 diabetes mellitus with diabetic polyneuropathy: Secondary | ICD-10-CM | POA: Diagnosis present

## 2022-01-10 DIAGNOSIS — J9611 Chronic respiratory failure with hypoxia: Secondary | ICD-10-CM | POA: Diagnosis present

## 2022-01-10 DIAGNOSIS — L89611 Pressure ulcer of right heel, stage 1: Secondary | ICD-10-CM | POA: Diagnosis present

## 2022-01-10 DIAGNOSIS — I10 Essential (primary) hypertension: Secondary | ICD-10-CM | POA: Diagnosis present

## 2022-01-10 DIAGNOSIS — R739 Hyperglycemia, unspecified: Principal | ICD-10-CM

## 2022-01-10 DIAGNOSIS — D696 Thrombocytopenia, unspecified: Secondary | ICD-10-CM | POA: Diagnosis present

## 2022-01-10 DIAGNOSIS — K7581 Nonalcoholic steatohepatitis (NASH): Secondary | ICD-10-CM | POA: Diagnosis not present

## 2022-01-10 DIAGNOSIS — Z881 Allergy status to other antibiotic agents status: Secondary | ICD-10-CM

## 2022-01-10 DIAGNOSIS — Z950 Presence of cardiac pacemaker: Secondary | ICD-10-CM | POA: Diagnosis present

## 2022-01-10 DIAGNOSIS — L89151 Pressure ulcer of sacral region, stage 1: Secondary | ICD-10-CM | POA: Diagnosis not present

## 2022-01-10 DIAGNOSIS — G9341 Metabolic encephalopathy: Secondary | ICD-10-CM | POA: Diagnosis present

## 2022-01-10 DIAGNOSIS — L89621 Pressure ulcer of left heel, stage 1: Secondary | ICD-10-CM | POA: Diagnosis not present

## 2022-01-10 DIAGNOSIS — Z794 Long term (current) use of insulin: Secondary | ICD-10-CM

## 2022-01-10 DIAGNOSIS — E785 Hyperlipidemia, unspecified: Secondary | ICD-10-CM | POA: Diagnosis present

## 2022-01-10 DIAGNOSIS — E1165 Type 2 diabetes mellitus with hyperglycemia: Principal | ICD-10-CM | POA: Diagnosis present

## 2022-01-10 DIAGNOSIS — F419 Anxiety disorder, unspecified: Secondary | ICD-10-CM | POA: Diagnosis present

## 2022-01-10 DIAGNOSIS — Z66 Do not resuscitate: Secondary | ICD-10-CM | POA: Diagnosis present

## 2022-01-10 DIAGNOSIS — J449 Chronic obstructive pulmonary disease, unspecified: Secondary | ICD-10-CM | POA: Diagnosis present

## 2022-01-10 DIAGNOSIS — E11649 Type 2 diabetes mellitus with hypoglycemia without coma: Secondary | ICD-10-CM | POA: Diagnosis not present

## 2022-01-10 DIAGNOSIS — L89321 Pressure ulcer of left buttock, stage 1: Secondary | ICD-10-CM | POA: Diagnosis present

## 2022-01-10 DIAGNOSIS — E11628 Type 2 diabetes mellitus with other skin complications: Secondary | ICD-10-CM | POA: Diagnosis not present

## 2022-01-10 DIAGNOSIS — R9431 Abnormal electrocardiogram [ECG] [EKG]: Secondary | ICD-10-CM | POA: Diagnosis not present

## 2022-01-10 DIAGNOSIS — H409 Unspecified glaucoma: Secondary | ICD-10-CM | POA: Diagnosis present

## 2022-01-10 DIAGNOSIS — Z87891 Personal history of nicotine dependence: Secondary | ICD-10-CM

## 2022-01-10 DIAGNOSIS — I82512 Chronic embolism and thrombosis of left femoral vein: Secondary | ICD-10-CM | POA: Diagnosis present

## 2022-01-10 DIAGNOSIS — Z833 Family history of diabetes mellitus: Secondary | ICD-10-CM

## 2022-01-10 DIAGNOSIS — L03115 Cellulitis of right lower limb: Secondary | ICD-10-CM | POA: Diagnosis present

## 2022-01-10 DIAGNOSIS — Z79899 Other long term (current) drug therapy: Secondary | ICD-10-CM

## 2022-01-10 DIAGNOSIS — K219 Gastro-esophageal reflux disease without esophagitis: Secondary | ICD-10-CM | POA: Diagnosis present

## 2022-01-10 DIAGNOSIS — Z91048 Other nonmedicinal substance allergy status: Secondary | ICD-10-CM

## 2022-01-10 DIAGNOSIS — Z888 Allergy status to other drugs, medicaments and biological substances status: Secondary | ICD-10-CM | POA: Diagnosis not present

## 2022-01-10 DIAGNOSIS — M7989 Other specified soft tissue disorders: Secondary | ICD-10-CM | POA: Diagnosis not present

## 2022-01-10 DIAGNOSIS — L03116 Cellulitis of left lower limb: Secondary | ICD-10-CM | POA: Diagnosis present

## 2022-01-10 DIAGNOSIS — Z885 Allergy status to narcotic agent status: Secondary | ICD-10-CM

## 2022-01-10 DIAGNOSIS — Z8673 Personal history of transient ischemic attack (TIA), and cerebral infarction without residual deficits: Secondary | ICD-10-CM | POA: Diagnosis not present

## 2022-01-10 DIAGNOSIS — L97909 Non-pressure chronic ulcer of unspecified part of unspecified lower leg with unspecified severity: Secondary | ICD-10-CM | POA: Diagnosis not present

## 2022-01-10 DIAGNOSIS — Z8249 Family history of ischemic heart disease and other diseases of the circulatory system: Secondary | ICD-10-CM

## 2022-01-10 DIAGNOSIS — Z7982 Long term (current) use of aspirin: Secondary | ICD-10-CM

## 2022-01-10 LAB — CBC WITH DIFFERENTIAL/PLATELET
Abs Immature Granulocytes: 0.06 10*3/uL (ref 0.00–0.07)
Basophils Absolute: 0.1 10*3/uL (ref 0.0–0.1)
Basophils Relative: 1 %
Eosinophils Absolute: 0.1 10*3/uL (ref 0.0–0.5)
Eosinophils Relative: 1 %
HCT: 49.2 % — ABNORMAL HIGH (ref 36.0–46.0)
Hemoglobin: 15.8 g/dL — ABNORMAL HIGH (ref 12.0–15.0)
Immature Granulocytes: 1 %
Lymphocytes Relative: 19 %
Lymphs Abs: 1.8 10*3/uL (ref 0.7–4.0)
MCH: 30.4 pg (ref 26.0–34.0)
MCHC: 32.1 g/dL (ref 30.0–36.0)
MCV: 94.8 fL (ref 80.0–100.0)
Monocytes Absolute: 0.4 10*3/uL (ref 0.1–1.0)
Monocytes Relative: 4 %
Neutro Abs: 6.8 10*3/uL (ref 1.7–7.7)
Neutrophils Relative %: 74 %
Platelets: 96 10*3/uL — ABNORMAL LOW (ref 150–400)
RBC: 5.19 MIL/uL — ABNORMAL HIGH (ref 3.87–5.11)
RDW: 14.3 % (ref 11.5–15.5)
WBC: 9.2 10*3/uL (ref 4.0–10.5)
nRBC: 0 % (ref 0.0–0.2)

## 2022-01-10 LAB — CBG MONITORING, ED
Glucose-Capillary: >600 mg/dL (ref 70–99)
Glucose-Capillary: 539 mg/dL (ref 70–99)
Glucose-Capillary: 381 mg/dL — ABNORMAL HIGH (ref 70–99)
Glucose-Capillary: 456 mg/dL — ABNORMAL HIGH (ref 70–99)
Glucose-Capillary: 342 mg/dL — ABNORMAL HIGH (ref 70–99)
Glucose-Capillary: 239 mg/dL — ABNORMAL HIGH (ref 70–99)
Glucose-Capillary: 132 mg/dL — ABNORMAL HIGH (ref 70–99)
Glucose-Capillary: 87 mg/dL (ref 70–99)

## 2022-01-10 LAB — OSMOLALITY: Osmolality: 293 mOsm/kg (ref 275–295)

## 2022-01-10 LAB — BASIC METABOLIC PANEL
Anion gap: 11 (ref 5–15)
BUN: 20 mg/dL (ref 8–23)
CO2: 30 mmol/L (ref 22–32)
Calcium: 9 mg/dL (ref 8.9–10.3)
Chloride: 94 mmol/L — ABNORMAL LOW (ref 98–111)
Creatinine, Ser: 0.58 mg/dL (ref 0.44–1.00)
GFR, Estimated: >60 mL/min (ref >60)
Glucose, Bld: 243 mg/dL — ABNORMAL HIGH (ref 70–99)
Potassium: 4.2 mmol/L (ref 3.5–5.1)
Sodium: 135 mmol/L (ref 135–145)

## 2022-01-10 LAB — I-STAT VENOUS BLOOD GAS, ED
Acid-Base Excess: 6 mmol/L — ABNORMAL HIGH (ref 0.0–2.0)
Bicarbonate: 29.4 mmol/L — ABNORMAL HIGH (ref 20.0–28.0)
Calcium, Ion: 0.89 mmol/L — CL (ref 1.15–1.40)
HCT: 50 % — ABNORMAL HIGH (ref 36.0–46.0)
Hemoglobin: 17 g/dL — ABNORMAL HIGH (ref 12.0–15.0)
O2 Saturation: 81 %
Potassium: 5.4 mmol/L — ABNORMAL HIGH (ref 3.5–5.1)
Sodium: 122 mmol/L — ABNORMAL LOW (ref 135–145)
TCO2: 31 mmol/L (ref 22–32)
pCO2, Ven: 38.7 mmHg — ABNORMAL LOW (ref 44–60)
pH, Ven: 7.489 — ABNORMAL HIGH (ref 7.25–7.43)
pO2, Ven: 42 mmHg (ref 32–45)

## 2022-01-10 LAB — COMPREHENSIVE METABOLIC PANEL
ALT: 18 U/L (ref 0–44)
AST: 23 U/L (ref 15–41)
Albumin: 3.6 g/dL (ref 3.5–5.0)
Alkaline Phosphatase: 79 U/L (ref 38–126)
Anion gap: 11 (ref 5–15)
BUN: 26 mg/dL — ABNORMAL HIGH (ref 8–23)
CO2: 25 mmol/L (ref 22–32)
Calcium: 9.3 mg/dL (ref 8.9–10.3)
Chloride: 89 mmol/L — ABNORMAL LOW (ref 98–111)
Creatinine, Ser: 0.68 mg/dL (ref 0.44–1.00)
GFR, Estimated: >60 mL/min (ref >60)
Glucose, Bld: 720 mg/dL (ref 70–99)
Potassium: 5.6 mmol/L — ABNORMAL HIGH (ref 3.5–5.1)
Sodium: 125 mmol/L — ABNORMAL LOW (ref 135–145)
Total Bilirubin: 0.6 mg/dL (ref 0.3–1.2)
Total Protein: 7.5 g/dL (ref 6.5–8.1)

## 2022-01-10 LAB — MAGNESIUM: Magnesium: 1.7 mg/dL (ref 1.7–2.4)

## 2022-01-10 LAB — LIPASE, BLOOD: Lipase: 69 U/L — ABNORMAL HIGH (ref 11–51)

## 2022-01-10 LAB — HEMOGLOBIN A1C
Hgb A1c MFr Bld: 13.2 % — ABNORMAL HIGH (ref 4.8–5.6)
Mean Plasma Glucose: 332.14 mg/dL

## 2022-01-10 LAB — SEDIMENTATION RATE: Sed Rate: 11 mm/hr (ref 0–22)

## 2022-01-10 MED ORDER — BUSPIRONE HCL 5 MG PO TABS
5.0000 mg | ORAL_TABLET | Freq: Three times a day (TID) | ORAL | Status: DC
  Administered 2022-01-10 – 2022-01-13 (×8): 5 mg via ORAL
  Filled 2022-01-10 (×8): qty 1

## 2022-01-10 MED ORDER — SODIUM CHLORIDE 0.9 % IV SOLN
1.0000 g | INTRAVENOUS | Status: DC
  Administered 2022-01-10 – 2022-01-12 (×3): 1 g via INTRAVENOUS
  Filled 2022-01-10 (×3): qty 10

## 2022-01-10 MED ORDER — DEXTROSE IN LACTATED RINGERS 5 % IV SOLN: INTRAVENOUS | Status: DC

## 2022-01-10 MED ORDER — LACTATED RINGERS IV SOLN: INTRAVENOUS | Status: DC

## 2022-01-10 MED ORDER — METOPROLOL SUCCINATE ER 50 MG PO TB24
50.0000 mg | ORAL_TABLET | Freq: Two times a day (BID) | ORAL | Status: DC
  Administered 2022-01-10 – 2022-01-13 (×6): 50 mg via ORAL
  Filled 2022-01-10 (×5): qty 1
  Filled 2022-01-10: qty 2

## 2022-01-10 MED ORDER — UMECLIDINIUM BROMIDE 62.5 MCG/ACT IN AEPB
1.0000 | INHALATION_SPRAY | Freq: Every day | RESPIRATORY_TRACT | Status: DC
  Administered 2022-01-11 – 2022-01-13 (×3): 1 via RESPIRATORY_TRACT
  Filled 2022-01-10: qty 7

## 2022-01-10 MED ORDER — CLOPIDOGREL BISULFATE 75 MG PO TABS
75.0000 mg | ORAL_TABLET | Freq: Every day | ORAL | Status: DC
  Administered 2022-01-11 – 2022-01-12 (×2): 75 mg via ORAL
  Filled 2022-01-10 (×2): qty 1

## 2022-01-10 MED ORDER — LACTATED RINGERS IV BOLUS
1000.0000 mL | Freq: Once | INTRAVENOUS | Status: AC
  Administered 2022-01-10: 1000 mL via INTRAVENOUS

## 2022-01-10 MED ORDER — LEVALBUTEROL TARTRATE 45 MCG/ACT IN AERO: 1.0000 | INHALATION_SPRAY | Freq: Four times a day (QID) | RESPIRATORY_TRACT | Status: DC | PRN

## 2022-01-10 MED ORDER — THIAMINE MONONITRATE 100 MG PO TABS
100.0000 mg | ORAL_TABLET | Freq: Every day | ORAL | Status: DC
  Administered 2022-01-11 – 2022-01-13 (×3): 100 mg via ORAL
  Filled 2022-01-10 (×5): qty 1

## 2022-01-10 MED ORDER — IPRATROPIUM-ALBUTEROL 0.5-2.5 (3) MG/3ML IN SOLN: 3.0000 mL | Freq: Four times a day (QID) | RESPIRATORY_TRACT | Status: DC | PRN

## 2022-01-10 MED ORDER — VANCOMYCIN HCL 750 MG/150ML IV SOLN
750.0000 mg | INTRAVENOUS | Status: DC
  Administered 2022-01-11 – 2022-01-12 (×2): 750 mg via INTRAVENOUS
  Filled 2022-01-10 (×3): qty 150

## 2022-01-10 MED ORDER — LEVALBUTEROL HCL 0.63 MG/3ML IN NEBU
0.6300 mg | INHALATION_SOLUTION | Freq: Four times a day (QID) | RESPIRATORY_TRACT | Status: DC | PRN
Start: 2022-01-10 — End: 2022-01-13

## 2022-01-10 MED ORDER — ALPRAZOLAM 0.25 MG PO TABS: 0.2500 mg | ORAL_TABLET | Freq: Every day | ORAL | Status: DC | PRN

## 2022-01-10 MED ORDER — PANTOPRAZOLE SODIUM 40 MG PO TBEC
40.0000 mg | DELAYED_RELEASE_TABLET | Freq: Every day | ORAL | Status: DC
  Administered 2022-01-11 – 2022-01-13 (×3): 40 mg via ORAL
  Filled 2022-01-10 (×3): qty 1

## 2022-01-10 MED ORDER — DEXTROSE 50 % IV SOLN: 0.0000 mL | INTRAVENOUS | Status: DC | PRN

## 2022-01-10 MED ORDER — VANCOMYCIN HCL IN DEXTROSE 1-5 GM/200ML-% IV SOLN
1000.0000 mg | Freq: Once | INTRAVENOUS | Status: AC
  Administered 2022-01-10: 1000 mg via INTRAVENOUS
  Filled 2022-01-10: qty 200

## 2022-01-10 MED ORDER — ARFORMOTEROL TARTRATE 15 MCG/2ML IN NEBU
15.0000 ug | INHALATION_SOLUTION | Freq: Two times a day (BID) | RESPIRATORY_TRACT | Status: DC
  Administered 2022-01-11 – 2022-01-13 (×4): 15 ug via RESPIRATORY_TRACT
  Filled 2022-01-10 (×6): qty 2

## 2022-01-10 MED ORDER — ADULT MULTIVITAMIN W/MINERALS CH
1.0000 | ORAL_TABLET | Freq: Every day | ORAL | Status: DC
Start: 2022-01-11 — End: 2022-01-13
  Administered 2022-01-11 – 2022-01-13 (×3): 1 via ORAL
  Filled 2022-01-10 (×3): qty 1

## 2022-01-10 MED ORDER — BUDESONIDE 0.25 MG/2ML IN SUSP
0.2500 mg | Freq: Two times a day (BID) | RESPIRATORY_TRACT | Status: DC
  Administered 2022-01-11 – 2022-01-13 (×4): 0.25 mg via RESPIRATORY_TRACT
  Filled 2022-01-10 (×6): qty 2

## 2022-01-10 MED ORDER — VANCOMYCIN HCL 750 MG/150ML IV SOLN
750.0000 mg | INTRAVENOUS | Status: DC
  Filled 2022-01-10: qty 150

## 2022-01-10 MED ORDER — INSULIN REGULAR(HUMAN) IN NACL 100-0.9 UT/100ML-% IV SOLN
INTRAVENOUS | Status: DC
  Administered 2022-01-10: 11.5 [IU]/h via INTRAVENOUS
  Filled 2022-01-10: qty 100

## 2022-01-10 MED ORDER — FUROSEMIDE 20 MG PO TABS
20.0000 mg | ORAL_TABLET | Freq: Every day | ORAL | Status: DC
  Administered 2022-01-11 – 2022-01-13 (×3): 20 mg via ORAL
  Filled 2022-01-10 (×3): qty 1

## 2022-01-10 MED ORDER — INSULIN REGULAR(HUMAN) IN NACL 100-0.9 UT/100ML-% IV SOLN: INTRAVENOUS | Status: DC

## 2022-01-10 MED ORDER — PRAVASTATIN SODIUM 10 MG PO TABS
20.0000 mg | ORAL_TABLET | Freq: Every day | ORAL | Status: DC
  Administered 2022-01-11 – 2022-01-13 (×3): 20 mg via ORAL
  Filled 2022-01-10 (×4): qty 2

## 2022-01-10 NOTE — Assessment & Plan Note (Signed)
86 year old with 1+day history of blood sugars >600 presenting with concerns for foot infection and elevated blood sugar -admit to progressive -she does not meet criteria for HHS/DKA, osmolality pending but is hyperglycemic with blood sugar >700 -likely due to cellulitis of foot in history of uncontrolled type 2 diabetes. A1C in May was >15.5 -repeat A1C, has been complaint with medication  -no other obvious source of infection but complains of dysuria, UA has not been collected and urine culture added-will f/u on this. -start hyperglycemia protocol with insulin gtt/IVF and titrate per protocol -IVF at 100 cc/hr, LR until glucose <250 and then change to D5LR at 100cc/hour  -sugar has come down to 456 without intervention so have decreased IVF rate  -non caloric liquids -really wants to eat so will hopefully transition her off insulin quickly to [facility]

## 2022-01-10 NOTE — ED Triage Notes (Signed)
Pt recently admitted for hyperglycemia. States her CBG has been high for 3-4 days. Endorses intermittent abd pain. Left foot red and swollen.

## 2022-01-10 NOTE — ED Notes (Signed)
Pt caretaker had to leave to pick up another client, caretaker left contact info incase of an emergency before she gets back to hospital. [place] [phone]

## 2022-01-10 NOTE — ED Provider Triage Note (Signed)
Emergency Medicine Provider Triage Evaluation Note  [name] , a 86 y.o. female  was evaluated in triage.  Pt complains of hyperglycemia.  She reports that she has had worsening redness, swelling and pain to her left foot.  Patient reports nausea.  Denies any focal abdominal pain.  No fevers or chills.  Patient recently treated for cellulitis by PCP and placed on doxycycline 08/30.  Recent admission and discharge for altered mental status with associated AKI and UTI.  Review of Systems  Positive: Left leg swelling, redness and pain with associated nausea Negative: Fevers, abdominal pain, chest pain, shortness of breath, vomiting  Physical Exam  BP (!) 157/64 (BP Location: Left Arm)   Pulse (!) 101   Temp 97.6 F (36.4 C) (Oral)   Resp 17   Ht 5' 3"  (1.6 m)   Wt 57 kg   SpO2 93%   BMI 22.26 kg/m  Gen:   Awake, no distress   Resp:  Normal effort  MSK:   Moves extremities without difficulty  Other:  Patient with erythema, edema and warmth to the left foot.  Patient does not have any open wounds.  DP pulse are 2+.  Heart regular rate and rhythm.  Lungs clear to auscultation bilaterally.  Alert and oriented x3.  Medical Decision Making  Medically screening exam initiated at 1:32 PM.  Appropriate orders placed.  [name] was informed that the remainder of the evaluation will be completed by another provider, this initial triage assessment does not replace that evaluation, and the importance of remaining in the ED until their evaluation is complete.  Patient presents for hyperglycemia with concern for infection to her left foot.  Recently on doxycycline.  Patient is afebrile in the ER.  She does have a glucose greater than 600.  No focal abdominal tenderness.  Basic labs were obtained including VBG to assess for DKA.  We will also obtain imaging of the left foot to evaluate for any concern for bony infection.   Doristine Devoid, PA-C 01/10/22 1338

## 2022-01-10 NOTE — ED Notes (Signed)
ED TO INPATIENT HANDOFF REPORT  ED Nurse Name and Phone #: Josh  S Name/Age/Gender [name] 86 y.o. female Room/Bed: 007C/007C  Code Status   Code Status: DNR  Home/SNF/Other Home Patient oriented to: self, place, time, and situation Is this baseline? Yes   Triage Complete: Triage complete  Chief Complaint Hyperglycemia due to type 2 diabetes mellitus ([place]) [E11.65]  Triage Note Pt recently admitted for hyperglycemia. States her CBG has been high for 3-4 days. Endorses intermittent abd pain. Left foot red and swollen.    Allergies Allergies  Allergen Reactions   Chlordiazepoxide-Clidinium Other (See Comments)    Sleepy,weak   Biaxin [Clarithromycin] Other (See Comments)    Foul taste, abd pain, diarrhea   Tape Other (See Comments)    SKIN IS VERY THIN AND WILL TEAR AND BRUISE EASILY!!   Ultram [Tramadol] Other (See Comments)    Caused tremors   Codeine Other (See Comments)    Upset the stomach    Level of Care/Admitting Diagnosis ED Disposition     ED Disposition  Admit   Condition  --   Comment  Hospital Area: [facility] [[id number]]  Level of Care: Progressive [102]  Admit to Progressive based on following criteria: MULTISYSTEM THREATS such as stable sepsis, metabolic/electrolyte imbalance with or without encephalopathy that is responding to early treatment.  May admit patient to [facility] or [name] if equivalent level of care is available:: No  Covid Evaluation: Asymptomatic - no recent exposure (last 10 days) testing not required  Diagnosis: Hyperglycemia due to type 2 diabetes mellitus [facility]) [[id number]]  Admitting Physician: Orma Flaming [[id number]]  Attending Physician: Orma Flaming [[id number]]  Certification:: I certify this patient will need inpatient services for at least 2 midnights  Estimated Length of Stay: 4          B Medical/Surgery History Past Medical History:  Diagnosis Date   Acute ischemic stroke ([place])  08/10/2021   Anxiety disorder, unspecified    Arthritis    Benign paroxysmal positional vertigo 08/09/2012   Chronic rhinitis    Cognitive communication deficit    Constipation, unspecified    COPD mixed type ([place])    on home O2   Diverticulosis    Esophageal stricture    Essential (primary) hypertension    Gastro-esophageal reflux disease without esophagitis    Glaucoma    Hiatal hernia    Hyperlipidemia, unspecified    Irritable bowel syndrome    Ischemic stroke ([place]) 08/09/2021   Presence of permanent cardiac pacemaker 01/14/2020   Spinal stenosis, lumbar region, without neurogenic claudication    Steatohepatitis    Thrombocytopenia, unspecified ([place])    Type 2 diabetes mellitus with diabetic polyneuropathy ([place])    Past Surgical History:  Procedure Laterality Date   APPENDECTOMY     CARPAL TUNNEL RELEASE     right hand   CHOLECYSTECTOMY OPEN  1978   COLONOSCOPY  07-2001   mild diverticulosis   ESOPHAGOGASTRODUODENOSCOPY  3474,25-95   H Hernia,es.stricture s/p dil 5F   INTRAOCULAR LENS INSERTION Bilateral    LIVER BIOPSY  [phone]   PACEMAKER IMPLANT N/A 01/14/2020   Procedure: PACEMAKER IMPLANT;  Surgeon: Evans Lance, MD;  Location: [facility];  Service: Cardiovascular;  Laterality: N/A;   PARATHYROID EXPLORATION     TONSILLECTOMY AND ADENOIDECTOMY     TOTAL ABDOMINAL HYSTERECTOMY     ULNAR NERVE TRANSPOSITION  12/07/2011   Procedure: ULNAR NERVE DECOMPRESSION/TRANSPOSITION;this was cancelled-not done  Surgeon: Herbie Baltimore  Christena Flake., MD;  Location: [facility];  Service: Orthopedics;  Laterality: Right;  right ulnar nerve in situ decompression   ULNAR TUNNEL RELEASE  03/07/2012   Procedure: CUBITAL TUNNEL RELEASE;  Surgeon: Roseanne Kaufman, MD;  Location: [facility];  Service: Orthopedics;  Laterality: Right;  ulnar nerve release at the elbow       A IV Location/Drains/Wounds Patient Lines/Drains/Airways Status     Active  Line/Drains/Airways     Name Placement date Placement time Site Days   Peripheral IV 01/10/22 20 G Right Antecubital 01/10/22  1854  Antecubital  less than 1   Peripheral IV 01/10/22 22 G 1.75" Left;Anterior Forearm 01/10/22  2036  Forearm  less than 1   Pressure Injury 01/13/20 Sacrum Medial Stage 1 -  Intact skin with non-blanchable redness of a localized area usually over a bony prominence. 01/13/20  2046  -- 728   Pressure Injury 01/19/20 Buttocks Stage 2 -  Partial thickness loss of dermis presenting as a shallow open injury with a red, pink wound bed without slough. redness 01/19/20  1400  -- 722   Pressure Injury Ankle Right;Medial Stage 2 -  Partial thickness loss of dermis presenting as a shallow open injury with a red, pink wound bed without slough. --  --  -- --            Intake/Output Last 24 hours No intake or output data in the 24 hours ending 01/10/22 2357  Labs/Imaging Results for orders placed or performed during the hospital encounter of 01/10/22 (from the past 48 hour(s))  CBG monitoring, ED     Status: Abnormal   Collection Time: 01/10/22 12:51 PM  Result Value Ref Range   Glucose-Capillary >600 (HH) 70 - 99 mg/dL    Comment: Glucose reference range applies only to samples taken after fasting for at least 8 hours.  CBC with Differential     Status: Abnormal   Collection Time: 01/10/22  1:42 PM  Result Value Ref Range   WBC 9.2 4.0 - 10.5 K/uL    Comment: WHITE COUNT CONFIRMED ON SMEAR   RBC 5.19 (H) 3.87 - 5.11 MIL/uL   Hemoglobin 15.8 (H) 12.0 - 15.0 g/dL   HCT 49.2 (H) 36.0 - 46.0 %   MCV 94.8 80.0 - 100.0 fL   MCH 30.4 26.0 - 34.0 pg   MCHC 32.1 30.0 - 36.0 g/dL   RDW 14.3 11.5 - 15.5 %   Platelets 96 (L) 150 - 400 K/uL    Comment: Immature Platelet Fraction may be clinically indicated, consider ordering this additional test [id number] REPEATED TO VERIFY PLATELET COUNT CONFIRMED BY SMEAR    nRBC 0.0 0.0 - 0.2 %   Neutrophils Relative % 74 %    Neutro Abs 6.8 1.7 - 7.7 K/uL   Lymphocytes Relative 19 %   Lymphs Abs 1.8 0.7 - 4.0 K/uL   Monocytes Relative 4 %   Monocytes Absolute 0.4 0.1 - 1.0 K/uL   Eosinophils Relative 1 %   Eosinophils Absolute 0.1 0.0 - 0.5 K/uL   Basophils Relative 1 %   Basophils Absolute 0.1 0.0 - 0.1 K/uL   Immature Granulocytes 1 %   Abs Immature Granulocytes 0.06 0.00 - 0.07 K/uL    Comment: Performed at [facility], [address] [place], [place] [address]  Comprehensive metabolic panel     Status: Abnormal   Collection Time: 01/10/22  1:42 PM  Result Value Ref Range  Sodium 125 (L) 135 - 145 mmol/L   Potassium 5.6 (H) 3.5 - 5.1 mmol/L   Chloride 89 (L) 98 - 111 mmol/L   CO2 25 22 - 32 mmol/L   Glucose, Bld 720 (HH) 70 - 99 mg/dL    Comment: CRITICAL RESULT CALLED TO, READ BACK BY AND VERIFIED WITH BARBER,M RN @ 7614 01/10/22 LEONARD,A Glucose reference range applies only to samples taken after fasting for at least 8 hours.    BUN 26 (H) 8 - 23 mg/dL   Creatinine, Ser 0.68 0.44 - 1.00 mg/dL   Calcium 9.3 8.9 - 10.3 mg/dL   Total Protein 7.5 6.5 - 8.1 g/dL   Albumin 3.6 3.5 - 5.0 g/dL   AST 23 15 - 41 U/L   ALT 18 0 - 44 U/L   Alkaline Phosphatase 79 38 - 126 U/L   Total Bilirubin 0.6 0.3 - 1.2 mg/dL   GFR, Estimated >60 >60 mL/min    Comment: (NOTE) Calculated using the CKD-EPI Creatinine Equation (2021)    Anion gap 11 5 - 15    Comment: Performed at [facility] [address], [place], [place] [address]  Lipase, blood     Status: Abnormal   Collection Time: 01/10/22  1:42 PM  Result Value Ref Range   Lipase 69 (H) 11 - 51 U/L    Comment: Performed at [facility] [address], [place], [place] [address]  Magnesium     Status: None   Collection Time: 01/10/22  1:42 PM  Result Value Ref Range   Magnesium 1.7 1.7 - 2.4 mg/dL    Comment: Performed at [facility] [address] [place], [place] [address]  I-Stat venous blood gas, [facility] ED only)      Status: Abnormal   Collection Time: 01/10/22  1:50 PM  Result Value Ref Range   pH, Ven 7.489 (H) 7.25 - 7.43   pCO2, Ven 38.7 (L) 44 - 60 mmHg   pO2, Ven 42 32 - 45 mmHg   Bicarbonate 29.4 (H) 20.0 - 28.0 mmol/L   TCO2 31 22 - 32 mmol/L   O2 Saturation 81 %   Acid-Base Excess 6.0 (H) 0.0 - 2.0 mmol/L   Sodium 122 (L) 135 - 145 mmol/L   Potassium 5.4 (H) 3.5 - 5.1 mmol/L   Calcium, Ion 0.89 (LL) 1.15 - 1.40 mmol/L   HCT 50.0 (H) 36.0 - 46.0 %   Hemoglobin 17.0 (H) 12.0 - 15.0 g/dL   Sample type VENOUS    Comment NOTIFIED PHYSICIAN   CBG monitoring, ED     Status: Abnormal   Collection Time: 01/10/22  4:07 PM  Result Value Ref Range   Glucose-Capillary 539 (HH) 70 - 99 mg/dL    Comment: Glucose reference range applies only to samples taken after fasting for at least 8 hours.   Comment 1 Notify RN   CBG monitoring, ED     Status: Abnormal   Collection Time: 01/10/22  6:53 PM  Result Value Ref Range   Glucose-Capillary 456 (H) 70 - 99 mg/dL    Comment: Glucose reference range applies only to samples taken after fasting for at least 8 hours.  CBG monitoring, ED     Status: Abnormal   Collection Time: 01/10/22  7:29 PM  Result Value Ref Range   Glucose-Capillary 381 (H) 70 - 99 mg/dL    Comment: Glucose reference range applies only to samples taken after fasting for at least  8 hours.  CBG monitoring, ED     Status: Abnormal   Collection Time: 01/10/22  8:35 PM  Result Value Ref Range   Glucose-Capillary 342 (H) 70 - 99 mg/dL    Comment: Glucose reference range applies only to samples taken after fasting for at least 8 hours.  Osmolality     Status: None   Collection Time: 01/10/22  8:59 PM  Result Value Ref Range   Osmolality 293 275 - 295 mOsm/kg    Comment: Performed at [facility], [place] [address] [place], [place] [address]  Hemoglobin A1c     Status: Abnormal   Collection Time: 01/10/22  8:59 PM  Result Value Ref Range   Hgb A1c MFr Bld 13.2 (H) 4.8 - 5.6 %     Comment: (NOTE) Pre diabetes:          5.7%-6.4%  Diabetes:              >6.4%  Glycemic control for   <7.0% adults with diabetes    Mean Plasma Glucose 332.14 mg/dL    Comment: Performed at [facility] [address], [place], [place] [address]  Basic metabolic panel     Status: Abnormal   Collection Time: 01/10/22  8:59 PM  Result Value Ref Range   Sodium 135 135 - 145 mmol/L    Comment: DELTA CHECK NOTED   Potassium 4.2 3.5 - 5.1 mmol/L    Comment: DELTA CHECK NOTED   Chloride 94 (L) 98 - 111 mmol/L   CO2 30 22 - 32 mmol/L   Glucose, Bld 243 (H) 70 - 99 mg/dL    Comment: Glucose reference range applies only to samples taken after fasting for at least 8 hours.   BUN 20 8 - 23 mg/dL   Creatinine, Ser 0.58 0.44 - 1.00 mg/dL   Calcium 9.0 8.9 - 10.3 mg/dL   GFR, Estimated >60 >60 mL/min    Comment: (NOTE) Calculated using the CKD-EPI Creatinine Equation (2021)    Anion gap 11 5 - 15    Comment: Performed at [facility] [address], [place], [place] [address]  Sedimentation rate     Status: None   Collection Time: 01/10/22  8:59 PM  Result Value Ref Range   Sed Rate 11 0 - 22 mm/hr    Comment: Performed at [facility] [address], [place], [place] [address]  CBG monitoring, ED     Status: Abnormal   Collection Time: 01/10/22  9:39 PM  Result Value Ref Range   Glucose-Capillary 239 (H) 70 - 99 mg/dL    Comment: Glucose reference range applies only to samples taken after fasting for at least 8 hours.  CBG monitoring, ED     Status: Abnormal   Collection Time: 01/10/22 10:42 PM  Result Value Ref Range   Glucose-Capillary 132 (H) 70 - 99 mg/dL    Comment: Glucose reference range applies only to samples taken after fasting for at least 8 hours.  CBG monitoring, ED     Status: None   Collection Time: 01/10/22 11:27 PM  Result Value Ref Range   Glucose-Capillary 87 70 - 99 mg/dL    Comment: Glucose reference range applies only to samples taken  after fasting for at least 8 hours.   *Note: Due to a large number of results and/or encounters for the requested time period, some results have not been displayed. A complete set of results can be  found in Results Review.   DG Foot Complete Left  Result Date: 01/10/2022 CLINICAL DATA:  Left foot redness and swelling. Pain. Recently treated for cellulitis by PCP. EXAM: LEFT FOOT - COMPLETE 3+ VIEW COMPARISON:  Left ankle radiographs 12/28/2021 FINDINGS: Minimal lateral great toe metatarsophalangeal joint space narrowing and peripheral osteophytosis. No acute fracture or dislocation. Mild-to-moderate midfoot and hindfoot greater than forefoot soft tissue swelling. IMPRESSION: Soft tissue swelling.  No acute bone abnormality. Electronically Signed   By: Yvonne Kendall M.D.   On: 01/10/2022 14:09    Pending Labs Unresulted Labs (From admission, onward)     Start     Ordered   01/11/22 5400  Basic metabolic panel  (Hyperglycemia (not DKA or HHS))  Daily at 5am,   R      01/10/22 1905   01/11/22 0500  CBC  Tomorrow morning,   R        01/10/22 1907   01/10/22 1923  Urine Culture  (Urine Culture)  Once,   R       Question:  Indication  Answer:  Dysuria   01/10/22 1922   01/10/22 1908  C-reactive protein  Once,   R        01/10/22 1907   01/10/22 1332  Urinalysis, Routine w reflex microscopic  Once,   URGENT        01/10/22 1332            Vitals/Pain Today's Vitals   01/10/22 2130 01/10/22 2140 01/10/22 2245 01/10/22 2250  BP: (!) 129/57  (!) 123/54 (!) 123/54  Pulse: 86  86 86  Resp: (!) 26  (!) 26   Temp:  98.1 F (36.7 C)    TempSrc:  Oral    SpO2: 98%  92%   Weight:      Height:      PainSc:        Isolation Precautions No active isolations  Medications Medications  lactated ringers infusion ( Intravenous Not Given 01/10/22 2148)  dextrose 5 % in lactated ringers infusion ( Intravenous Not Given 01/10/22 2142)  insulin regular, human (MYXREDLIN) 100 units/ 100 mL infusion  (0 Units/hr Intravenous Paused 01/10/22 2243)  lactated ringers infusion ( Intravenous Not Given 01/10/22 2142)  dextrose 5 % in lactated ringers infusion ( Intravenous Restarted 01/10/22 2347)  dextrose 50 % solution 0-50 mL (has no administration in time range)  cefTRIAXone (ROCEPHIN) 1 g in sodium chloride 0.9 % 100 mL IVPB (0 g Intravenous Stopped 01/10/22 2328)  furosemide (LASIX) tablet 20 mg (has no administration in time range)  pravastatin (PRAVACHOL) tablet 20 mg (has no administration in time range)  ALPRAZolam (XANAX) tablet 0.25 mg (has no administration in time range)  busPIRone (BUSPAR) tablet 5 mg (5 mg Oral Given 01/10/22 2251)  pantoprazole (PROTONIX) EC tablet 40 mg (has no administration in time range)  clopidogrel (PLAVIX) tablet 75 mg (has no administration in time range)  multivitamin with minerals tablet 1 tablet (has no administration in time range)  thiamine (VITAMIN B1) tablet 100 mg (has no administration in time range)  budesonide (PULMICORT) nebulizer solution 0.25 mg (1 puff Inhalation Not Given 01/10/22 2038)  arformoterol (BROVANA) nebulizer solution 15 mcg (has no administration in time range)    And  umeclidinium bromide (INCRUSE ELLIPTA) 62.5 MCG/ACT 1 puff (has no administration in time range)  ipratropium-albuterol (DUONEB) 0.5-2.5 (3) MG/3ML nebulizer solution 3 mL (has no administration in time range)  metoprolol succinate (TOPROL-XL) 24 hr tablet 50  mg (50 mg Oral Given 01/10/22 2250)  levalbuterol (XOPENEX) nebulizer solution 0.63 mg (has no administration in time range)  vancomycin (VANCOREADY) IVPB 750 mg/150 mL (has no administration in time range)  lactated ringers bolus 1,000 mL (0 mLs Intravenous Stopped 01/10/22 2140)  vancomycin (VANCOCIN) IVPB 1000 mg/200 mL premix (0 mg Intravenous Stopped 01/10/22 2140)    Mobility walks with device Moderate fall risk   Focused Assessments     R Recommendations: See Admitting Provider Note  Report given to:    Additional Notes:

## 2022-01-10 NOTE — Assessment & Plan Note (Signed)
Continue protonix  

## 2022-01-10 NOTE — Assessment & Plan Note (Addendum)
Continue stiolto and flovent daily SABA/duoneb prn  Oxygen at 3-3.5L (she states she is usually at 2L)

## 2022-01-10 NOTE — Assessment & Plan Note (Addendum)
Platelets have been normal and were 155K in 12/2021 Now at 96, possibly secondary to foot infection Hold VTE prophylaxis, trend

## 2022-01-10 NOTE — Progress Notes (Signed)
Attempted lower extremity venous duplex, however patient is receiving RN care. Will attempt again as schedule permits.  01/10/2022 6:36 PM Kelby Aline., MHA, RVT, RDCS, RDMS

## 2022-01-10 NOTE — Progress Notes (Signed)
Pharmacy Antibiotic Note  [name] is a 86 y.o. female for which pharmacy has been consulted for vancomycin dosing for cellulitis.  Patient with a history of CVA, COPD with chronic respiratory failure, HTN, GERD, HLD, CHB s/pPPM, T2DM, thrombocytopenia . Patient presenting with hyperglycemia.  SCr 0.68 WBC 9.2; afebrile; HR 86; RR 24  Plan: Ceftriaxone per MD Vancomycin 1000 mg once then 750 mg q24hr (eAUC 453) unless change in renal function Trend WBC, Fever, Renal function, & Clinical course F/u cultures, clinical course, WBC, fever De-escalate when able Levels at steady state  Height: 5' 3"  (160 cm) Weight: 57 kg (125 lb 10.6 oz) IBW/kg (Calculated) : 52.4  Temp (24hrs), Avg:98 F (36.7 C), Min:97.6 F (36.4 C), Max:98.3 F (36.8 C)  Recent Labs  Lab 01/10/22 1342  WBC 9.2  CREATININE 0.68    Estimated Creatinine Clearance: 43.3 mL/min (by C-G formula based on SCr of 0.68 mg/dL).    Allergies  Allergen Reactions   Chlordiazepoxide-Clidinium Other (See Comments)    Sleepy,weak   Biaxin [Clarithromycin] Other (See Comments)    Foul taste, abd pain, diarrhea   Tape Other (See Comments)    SKIN IS VERY THIN AND WILL TEAR AND BRUISE EASILY!!   Ultram [Tramadol] Other (See Comments)    Caused tremors   Codeine Other (See Comments)    Upset the stomach    Antimicrobials this admission: vancomycin 9/5 >>  rocephin 9/5 >>  Microbiology results: Pending  Thank you for allowing pharmacy to be a part of this patient's care.  Lorelei Pont, PharmD, BCPS 01/10/2022 6:11 PM ED Clinical Pharmacist -  [phone]

## 2022-01-10 NOTE — Assessment & Plan Note (Signed)
Continue statin. 

## 2022-01-10 NOTE — Assessment & Plan Note (Signed)
Continue home toprol-xl 66m BID and lasix  Med rec needs to be done for avapro, unsure of last dose

## 2022-01-10 NOTE — Telephone Encounter (Signed)
Patient's caregiver called stating that she had tried to check patient's blood sugar and it just kept reading "high" on glucometer. The nurse that comes to change dressings checked it and it was over 600. Caregiver has taken patient to the ED and just wanted to make [place] aware what was going on with patient. She also stated that patient's feet aren't getting better.  Message routed to Dr. Lillette Boxer. Sabra Heck

## 2022-01-10 NOTE — ED Notes (Signed)
Care taker had to leave to pick up another client, contact info was left if there's an amer

## 2022-01-10 NOTE — Assessment & Plan Note (Signed)
[employer] Dual Chamber PPM implanted 01/2020 for CHB

## 2022-01-10 NOTE — Assessment & Plan Note (Signed)
Seen by PCP on 8/30 and started on doxycycline Left foot appears cellulitic and edematous although per OV notes she has chronic left LE edema Check stat DVT US of LLE On vanc+rocephin for abx in uncontrolled diabetic  Xray with no bone involvement Check ESR/CRP Check ABI

## 2022-01-10 NOTE — Assessment & Plan Note (Deleted)
w

## 2022-01-10 NOTE — H&P (Signed)
History and Physical    Patient: [name] CVE:[id number] DOB: 1935/05/12 DOA: 01/10/2022 DOS: the patient was seen and examined on 01/10/2022 PCP: Wardell Honour, MD  Patient coming from: Home - lives alone. Uses a walker.    Chief Complaint: hyperglycemia   HPI: [name] is a 86 y.o. female with medical history significant of CVA, COPD with chronic respiratory failure, HTN, GERD, HLD, CHB s/pPPM, T2DM, thrombocytopenia who presented to ED with concerns for high blood sugars.  She states she woke up and her blood sugar was >500. Caregiver came and blood sugar kept saying >600 so she brought her to ED. She denies any fever/chills, coughing, abdominal pain, N/V/D. She has had dysuria over the last few weeks. She also complains of left leg/foot swelling. She was seen by her PCP on 8/30 for left foot cellulitis and started on doxycycline.   Denies any fever/chills, vision changes/headaches, chest pain or palpitations,. leg swelling.    She does not smoke and she does not drink alcohol.   ER Course:  vitals: afebrile, bp: 157/64, HR: 101, RR: 17, oxygen: 93%RA Pertinent labs: hgb: 15.8, platelets 96, sodium: 125, glucose: 720, potassium: 5.6, lipase: 69, pH: 7.489,  Left foot xray: soft tissue swelling In ED: given IVF and started on insulin drip using endotool. Started on vancomycin. TRH asked to admit.     Review of Systems: As mentioned in the history of present illness. All other systems reviewed and are negative. Past Medical History:  Diagnosis Date   Acute ischemic stroke ([place]) 08/10/2021   Anxiety disorder, unspecified    Arthritis    Benign paroxysmal positional vertigo 08/09/2012   Chronic rhinitis    Cognitive communication deficit    Constipation, unspecified    COPD mixed type ([place])    on home O2   Diverticulosis    Esophageal stricture    Essential (primary) hypertension    Gastro-esophageal reflux disease without esophagitis    Glaucoma    Hiatal  hernia    Hyperlipidemia, unspecified    Irritable bowel syndrome    Ischemic stroke ([place]) 08/09/2021   Presence of permanent cardiac pacemaker 01/14/2020   Spinal stenosis, lumbar region, without neurogenic claudication    Steatohepatitis    Thrombocytopenia, unspecified ([place])    Type 2 diabetes mellitus with diabetic polyneuropathy ([place])    Past Surgical History:  Procedure Laterality Date   APPENDECTOMY     CARPAL TUNNEL RELEASE     right hand   CHOLECYSTECTOMY OPEN  1978   COLONOSCOPY  07-2001   mild diverticulosis   ESOPHAGOGASTRODUODENOSCOPY  0258,52-77   H Hernia,es.stricture s/p dil 45F   INTRAOCULAR LENS INSERTION Bilateral    LIVER BIOPSY  12-2421   PACEMAKER IMPLANT N/A 01/14/2020   Procedure: PACEMAKER IMPLANT;  Surgeon: Evans Lance, MD;  Location: [facility];  Service: Cardiovascular;  Laterality: N/A;   PARATHYROID EXPLORATION     TONSILLECTOMY AND ADENOIDECTOMY     TOTAL ABDOMINAL HYSTERECTOMY     ULNAR NERVE TRANSPOSITION  12/07/2011   Procedure: ULNAR NERVE DECOMPRESSION/TRANSPOSITION;this was cancelled-not done  Surgeon: Cammie Sickle., MD;  Location: [facility];  Service: Orthopedics;  Laterality: Right;  right ulnar nerve in situ decompression   ULNAR TUNNEL RELEASE  03/07/2012   Procedure: CUBITAL TUNNEL RELEASE;  Surgeon: Roseanne Kaufman, MD;  Location: [facility];  Service: Orthopedics;  Laterality: Right;  ulnar nerve release at the elbow     Social  History:  reports that she quit smoking about 33 years ago. Her smoking use included cigarettes. She has never used smokeless tobacco. She reports that she does not drink alcohol and does not use drugs.  Allergies  Allergen Reactions   Chlordiazepoxide-Clidinium Other (See Comments)    Sleepy,weak   Biaxin [Clarithromycin] Other (See Comments)    Foul taste, abd pain, diarrhea   Tape Other (See Comments)    SKIN IS VERY THIN AND WILL TEAR AND BRUISE EASILY!!   Ultram  [Tramadol] Other (See Comments)    Caused tremors   Codeine Other (See Comments)    Upset the stomach    Family History  Problem Relation Age of Onset   Heart disease Father    Lung cancer Mother        small cell;Byssinosis   Lung cancer Sister    Liver cancer Sister        ? mets from another area of the body   Diabetes Other        grandmother   Stroke Maternal Grandfather     Prior to Admission medications   Medication Sig Start Date End Date Taking? Authorizing Provider  acetaminophen (TYLENOL) 325 MG tablet Take 2 tablets (650 mg total) by mouth every 6 (six) hours as needed for mild pain (or Fever >/= 101). 01/23/20   Domenic Polite, MD  albuterol (VENTOLIN HFA) 108 (90 Base) MCG/ACT inhaler Inhale 1-2 puffs into the lungs every 6 (six) hours as needed for wheezing or shortness of breath. 12/26/21   Young, Kasandra Knudsen, MD  ALPRAZolam Duanne Moron) 0.5 MG tablet Take 0.25 mg by mouth daily as needed for anxiety.    [provider]  aspirin EC 81 MG tablet Take 81 mg by mouth daily. Patient not taking: Reported on 01/04/2022    [provider]  busPIRone (BUSPAR) 5 MG tablet Take 5 mg by mouth 3 (three) times daily.    [provider]  clopidogrel (PLAVIX) 75 MG tablet Take 1 tablet (75 mg total) by mouth daily. 09/14/21   Alyce Friar, PA-C  doxycycline (VIBRA-TABS) 100 MG tablet Take 1 tablet (100 mg total) by mouth 2 (two) times daily for 7 days. 01/04/22 01/11/22  Ngetich, Dinah C, NP  feeding supplement (ENSURE ENLIVE / ENSURE PLUS) LIQD Take 237 mLs by mouth 2 (two) times daily between meals. 12/12/21   Mariel Aloe, MD  fluorometholone (FML) 0.1 % ophthalmic suspension Place 1 drop into both eyes 3 (three) times daily. 12/01/21   [provider]  fluticasone (FLOVENT HFA) 110 MCG/ACT inhaler Inhale 1 puff into the lungs 2 (two) times daily.    [provider]  furosemide (LASIX) 20 MG tablet Take 20 mg by mouth daily.    [provider]  insulin aspart (NOVOLOG FLEXPEN) 100 UNIT/ML FlexPen Inject 5 Units into the skin 3 (three) times daily with meals. 11/13/21   Linus Galas, MD  insulin degludec (TRESIBA FLEXTOUCH) 200 UNIT/ML FlexTouch Pen Inject 26 Units into the skin at bedtime. 12/12/21   Mariel Aloe, MD  ipratropium-albuterol (DUONEB) 0.5-2.5 (3) MG/3ML SOLN 1 vial in neb every 8 hours and as needed 01/17/21   Baird Lyons D, MD  irbesartan (AVAPRO) 75 MG tablet Take 75 mg by mouth daily.    [provider]  levalbuterol Penne Lash HFA) 45 MCG/ACT inhaler Inhale 1-2 puffs into the lungs every 6 (six) hours as needed for wheezing. 12/30/21   Deneise Lever, MD  metoprolol  succinate (TOPROL-XL) 50 MG 24 hr tablet TAKE ONE TABLET TWICE A DAY WITH OR IMMEDIATELY FOLLOWING A MEAL. 11/02/21   Wardell Honour, MD  mirabegron ER (MYRBETRIQ) 50 MG TB24 tablet Take 1 tablet (50 mg total) by mouth daily. 09/16/21   Wardell Honour, MD  Multiple Vitamin (MULTIVITAMIN WITH MINERALS) TABS tablet Take 1 tablet by mouth daily. 08/14/21   Pokhrel, Corrie Mckusick, MD  pantoprazole (PROTONIX) 40 MG tablet Take 1 tablet (40 mg total) by mouth daily. 08/13/21 08/13/22  Pokhrel, Corrie Mckusick, MD  pravastatin (PRAVACHOL) 20 MG tablet Take 1 tablet (20 mg total) by mouth daily. 02/28/21   Wardell Honour, MD  thiamine 100 MG tablet Take 1 tablet (100 mg total) by mouth daily. 08/14/21   Pokhrel, Corrie Mckusick, MD  Tiotropium Bromide-Olodaterol (STIOLTO RESPIMAT) 2.5-2.5 MCG/ACT AERS Inhale 2 puffs into the lungs daily. 10/20/21   Baird Lyons D, MD    Physical Exam: Vitals:   01/10/22 1305 01/10/22 1501 01/10/22 1645 01/10/22 1815  BP:  129/64 (!) 131/53 (!) 145/67  Pulse:  86 85 91  Resp:  15 15 (!) 24  Temp:  98.3 F (36.8 C) 98.1 F (36.7 C)   TempSrc:      SpO2:  92% (!) 89% 90%  Weight: 57 kg     Height: 5' 3"  (1.6 m)      General:  Appears calm and comfortable and is in NAD Eyes:  PERRL, EOMI, normal lids, iris ENT:   grossly normal hearing, lips & tongue, mmm; appropriate dentition Neck:  no LAD, masses or thyromegaly; no carotid bruits Cardiovascular:  RRR, no m/r/g. 2+ pitting LLE edema to below the knee  Respiratory:   CTA bilaterally with no wheezes/rales/rhonchi.  Normal respiratory effort. Abdomen:  soft, NT, ND, NABS Back:   normal alignment, no CVAT Skin:  left lower leg with scabs and surrouding erythema on anterior shin. Left lower foot with erythema at base and scabbed lesion    Musculoskeletal:  grossly normal tone BUE/BLE, good ROM, no bony abnormality Lower extremity:   see above.  2+ distal pulses. Psychiatric:  grossly normal mood and affect, speech fluent and appropriate, AOx3 Neurologic:  CN 2-12 grossly intact, moves all extremities in coordinated fashion, sensation intact   Radiological Exams on Admission: Independently reviewed - see discussion in A/P where applicable  DG Foot Complete Left  Result Date: 01/10/2022 CLINICAL DATA:  Left foot redness and swelling. Pain. Recently treated for cellulitis by PCP. EXAM: LEFT FOOT - COMPLETE 3+ VIEW COMPARISON:  Left ankle radiographs 12/28/2021 FINDINGS: Minimal lateral great toe metatarsophalangeal joint space narrowing and peripheral osteophytosis. No acute fracture or dislocation. Mild-to-moderate midfoot and hindfoot greater than forefoot soft tissue swelling. IMPRESSION: Soft tissue swelling.  No acute bone abnormality. Electronically Signed   By: Yvonne Kendall M.D.   On: 01/10/2022 14:09    EKG: pending   Labs on Admission: I have personally reviewed the available labs and imaging studies at the time of the admission.  Pertinent labs:   hgb: 15.8,  platelets 96,  sodium: 125 (corrected 135)  glucose: 720,  potassium: 5.6,  lipase: 69,  pH: 7.489,   Assessment and Plan: Principal Problem:   Hyperglycemia due to type 2 diabetes mellitus (HCC) Active Problems:   Cellulitis in diabetic foot (HCC)   Thrombocytopenia (HCC)    Hypertension   COPD mixed type with chronic respiratory failure    Pacemaker   Hyperlipidemia   GERD (gastroesophageal reflux disease)   History  of CVA (cerebrovascular accident)    Assessment and Plan: * Hyperglycemia due to type 2 diabetes mellitus ([place]) 86 year old with 1+day history of blood sugars >600 presenting with concerns for foot infection and elevated blood sugar -admit to progressive -she does not meet criteria for HHS/DKA, osmolality pending but is hyperglycemic with blood sugar >700 -likely due to cellulitis of foot in history of uncontrolled type 2 diabetes. A1C in May was >15.5 -repeat A1C, has been complaint with medication  -no other obvious source of infection but complains of dysuria, UA has not been collected and urine culture added-will f/u on this. -start hyperglycemia protocol with insulin gtt/IVF and titrate per protocol -IVF at 100 cc/hr, LR until glucose <250 and then change to D5LR at 100cc/hour  -sugar has come down to 456 without intervention so have decreased IVF rate  -non caloric liquids -really wants to eat so will hopefully transition her off insulin quickly to [facility] insulin   Cellulitis in diabetic foot ([place]) Seen by PCP on 8/30 and started on doxycycline Left foot appears cellulitic and edematous although per OV notes she has chronic left LE edema Check stat DVT US of LLE On vanc+rocephin for abx in uncontrolled diabetic  Xray with no bone involvement Check ESR/CRP Check ABI   Thrombocytopenia (HCC) Platelets have been normal and were 155K in 12/2021 Now at 96, possibly secondary to foot infection Hold VTE prophylaxis, trend   Hypertension Continue home toprol-xl 25m BID and lasix  Med rec needs to be done for avapro, unsure of last dose   COPD mixed type with chronic respiratory failure  Continue stiolto and flovent daily SABA/duoneb prn  Oxygen at 3-3.5L (she states she is usually at 2L)   Pacemaker [employer] Dual Chamber PPM implanted  01/2020 for CHB  Hyperlipidemia Continue statin   GERD (gastroesophageal reflux disease) Continue protonix   History of CVA (cerebrovascular accident) Continue plavix and statin      Advance Care Planning:   Code Status: DNR   Consults: none   DVT Prophylaxis: SCDs  Family Communication: attempted to update her friend, chris schulman by phone, but no answer.   Severity of Illness: The appropriate patient status for this patient is INPATIENT. Inpatient status is judged to be reasonable and necessary in order to provide the required intensity of service to ensure the patient's safety. The patient's presenting symptoms, physical exam findings, and initial radiographic and laboratory data in the context of their chronic comorbidities is felt to place them at high risk for further clinical deterioration. Furthermore, it is not anticipated that the patient will be medically stable for discharge from the hospital within 2 midnights of admission.   * I certify that at the point of admission it is my clinical judgment that the patient will require inpatient hospital care spanning beyond 2 midnights from the point of admission due to high intensity of service, high risk for further deterioration and high frequency of surveillance required.*  Author: AOrma Flaming MD 01/10/2022 7:29 PM  For on call review [email or web address]

## 2022-01-10 NOTE — Assessment & Plan Note (Signed)
Continue plavix and statin

## 2022-01-10 NOTE — ED Provider Notes (Signed)
[facility] EMERGENCY DEPARTMENT Provider Note   CSN: [id number] Arrival date & time: 01/10/22  1242     History  Chief Complaint  Patient presents with   Hyperglycemia    [name] is a 86 y.o. female.  HPI 86 year old female presents with left foot swelling and redness and concern for infection.  She states he has been battling this for about a month.  No fevers.  On and off generalized weakness.  Has been on antibiotics from her PCP and has had caregivers try to take care of it.  Today her glucose was unreasonably high.  She reports compliance with her medicines.  Home Medications Prior to Admission medications   Medication Sig Start Date End Date Taking? Authorizing Provider  acetaminophen (TYLENOL) 325 MG tablet Take 2 tablets (650 mg total) by mouth every 6 (six) hours as needed for mild pain (or Fever >/= 101). 01/23/20   Domenic Polite, MD  albuterol (VENTOLIN HFA) 108 (90 Base) MCG/ACT inhaler Inhale 1-2 puffs into the lungs every 6 (six) hours as needed for wheezing or shortness of breath. 12/26/21   Young, Kasandra Knudsen, MD  ALPRAZolam Duanne Moron) 0.5 MG tablet Take 0.25 mg by mouth daily as needed for anxiety.    [provider]  aspirin EC 81 MG tablet Take 81 mg by mouth daily. Patient not taking: Reported on 01/04/2022    [provider]  busPIRone (BUSPAR) 5 MG tablet Take 5 mg by mouth 3 (three) times daily.    [provider]  clopidogrel (PLAVIX) 75 MG tablet Take 1 tablet (75 mg total) by mouth daily. 09/14/21   Calley Friar, PA-C  doxycycline (VIBRA-TABS) 100 MG tablet Take 1 tablet (100 mg total) by mouth 2 (two) times daily for 7 days. 01/04/22 01/11/22  Ngetich, Dinah C, NP  feeding supplement (ENSURE ENLIVE / ENSURE PLUS) LIQD Take 237 mLs by mouth 2 (two) times daily between meals. 12/12/21   Mariel Aloe, MD  fluorometholone (FML) 0.1 % ophthalmic suspension Place 1 drop into both eyes 3 (three) times daily.  12/01/21   [provider]  fluticasone (FLOVENT HFA) 110 MCG/ACT inhaler Inhale 1 puff into the lungs 2 (two) times daily.    [provider]  furosemide (LASIX) 20 MG tablet Take 20 mg by mouth daily.    [provider]  insulin aspart (NOVOLOG FLEXPEN) 100 UNIT/ML FlexPen Inject 5 Units into the skin 3 (three) times daily with meals. 11/13/21   Linus Galas, MD  insulin degludec (TRESIBA FLEXTOUCH) 200 UNIT/ML FlexTouch Pen Inject 26 Units into the skin at bedtime. 12/12/21   Mariel Aloe, MD  ipratropium-albuterol (DUONEB) 0.5-2.5 (3) MG/3ML SOLN 1 vial in neb every 8 hours and as needed 01/17/21   Baird Lyons D, MD  irbesartan (AVAPRO) 75 MG tablet Take 75 mg by mouth daily.    [provider]  levalbuterol Penne Lash HFA) 45 MCG/ACT inhaler Inhale 1-2 puffs into the lungs every 6 (six) hours as needed for wheezing. 12/30/21   Baird Lyons D, MD  metoprolol succinate (TOPROL-XL) 50 MG 24 hr tablet TAKE ONE TABLET TWICE A DAY WITH OR IMMEDIATELY FOLLOWING A MEAL. 11/02/21   Wardell Honour, MD  mirabegron ER (MYRBETRIQ) 50 MG TB24 tablet Take 1 tablet (50 mg total) by mouth daily. 09/16/21   Wardell Honour, MD  Multiple Vitamin (MULTIVITAMIN WITH MINERALS) TABS tablet Take 1 tablet by mouth daily. 08/14/21   Pokhrel, Corrie Mckusick, MD  pantoprazole (PROTONIX) 40 MG tablet Take 1 tablet (40 mg total) by mouth daily. 08/13/21 08/13/22  Pokhrel, Corrie Mckusick, MD  pravastatin (PRAVACHOL) 20 MG tablet Take 1 tablet (20 mg total) by mouth daily. 02/28/21   Wardell Honour, MD  thiamine 100 MG tablet Take 1 tablet (100 mg total) by mouth daily. 08/14/21   Pokhrel, Corrie Mckusick, MD  Tiotropium Bromide-Olodaterol (STIOLTO RESPIMAT) 2.5-2.5 MCG/ACT AERS Inhale 2 puffs into the lungs daily. 10/20/21   Deneise Lever, MD      Allergies    Chlordiazepoxide-clidinium, Biaxin [clarithromycin], Tape, Ultram [tramadol], and Codeine    Review of Systems   Review of Systems   Constitutional:  Negative for fever.  Cardiovascular:  Positive for leg swelling.  Gastrointestinal:  Negative for abdominal pain and vomiting.  Skin:  Positive for color change.    Physical Exam Updated Vital Signs BP (!) 131/53 (BP Location: Right Arm)   Pulse 85   Temp 98.1 F (36.7 C)   Resp 15   Ht 5' 3"  (1.6 m)   Wt 57 kg   SpO2 (!) 89%   BMI 22.26 kg/m  Physical Exam Vitals and nursing note reviewed.  Constitutional:      General: She is not in acute distress.    Appearance: She is well-developed. She is not ill-appearing or diaphoretic.  HENT:     Head: Normocephalic and atraumatic.  Cardiovascular:     Rate and Rhythm: Normal rate and regular rhythm.     Pulses:          Dorsalis pedis pulses are 2+ on the left side.     Heart sounds: Normal heart sounds.  Pulmonary:     Effort: Pulmonary effort is normal.     Breath sounds: Normal breath sounds.  Abdominal:     General: There is no distension.     Palpations: Abdomen is soft.     Tenderness: There is no abdominal tenderness.  Musculoskeletal:     Comments: Left foot is diffusely swollen.  Perhaps mildly warmer compared to the right.  There is some erythema to the distal foot going into the toes with a couple breaks in the skin and some scant discharge.  No palpable fluctuance/induration.  The swelling goes up her lower leg with a couple very small punctate red wounds but no streaking cellulitis.  Skin:    General: Skin is warm and dry.  Neurological:     Mental Status: She is alert.     ED Results / Procedures / Treatments   Labs (all labs ordered are listed, but only abnormal results are displayed) Labs Reviewed  CBC WITH DIFFERENTIAL/PLATELET - Abnormal; Notable for the following components:      Result Value   RBC 5.19 (*)    Hemoglobin 15.8 (*)    HCT 49.2 (*)    Platelets 96 (*)    All other components within normal limits  COMPREHENSIVE METABOLIC PANEL - Abnormal; Notable for the following  components:   Sodium 125 (*)    Potassium 5.6 (*)    Chloride 89 (*)    Glucose, Bld 720 (*)    BUN 26 (*)    All other components within normal limits  LIPASE, BLOOD - Abnormal; Notable for the following components:   Lipase 69 (*)    All other components within normal limits  CBG MONITORING, ED - Abnormal; Notable for the following components:   Glucose-Capillary >600 (*)    All other components within normal limits  I-STAT VENOUS BLOOD GAS, ED - Abnormal; Notable for the following components:   pH, Ven 7.489 (*)    pCO2, Ven 38.7 (*)    Bicarbonate 29.4 (*)    Acid-Base Excess 6.0 (*)    Sodium 122 (*)    Potassium 5.4 (*)    Calcium, Ion 0.89 (*)    HCT 50.0 (*)    Hemoglobin 17.0 (*)    All other components within normal limits  CBG MONITORING, ED - Abnormal; Notable for the following components:   Glucose-Capillary 539 (*)    All other components within normal limits  MAGNESIUM  URINALYSIS, ROUTINE W REFLEX MICROSCOPIC  OSMOLALITY  CBG MONITORING, ED    EKG None  Radiology DG Foot Complete Left  Result Date: 01/10/2022 CLINICAL DATA:  Left foot redness and swelling. Pain. Recently treated for cellulitis by PCP. EXAM: LEFT FOOT - COMPLETE 3+ VIEW COMPARISON:  Left ankle radiographs 12/28/2021 FINDINGS: Minimal lateral great toe metatarsophalangeal joint space narrowing and peripheral osteophytosis. No acute fracture or dislocation. Mild-to-moderate midfoot and hindfoot greater than forefoot soft tissue swelling. IMPRESSION: Soft tissue swelling.  No acute bone abnormality. Electronically Signed   By: Yvonne Kendall M.D.   On: 01/10/2022 14:09    Procedures .Critical Care  Performed by: Sherwood Gambler, MD Authorized by: Sherwood Gambler, MD   Critical care provider statement:    Critical care time (minutes):  30   Critical care time was exclusive of:  Separately billable procedures and treating other patients   Critical care was necessary to treat or prevent  imminent or life-threatening deterioration of the following conditions:  Endocrine crisis   Critical care was time spent personally by me on the following activities:  Development of treatment plan with patient or surrogate, discussions with consultants, evaluation of patient's response to treatment, examination of patient, ordering and review of laboratory studies, ordering and review of radiographic studies, ordering and performing treatments and interventions, pulse oximetry, re-evaluation of patient's condition and review of old charts     Medications Ordered in ED Medications  insulin regular, human (MYXREDLIN) 100 units/ 100 mL infusion (has no administration in time range)  lactated ringers infusion (has no administration in time range)  dextrose 5 % in lactated ringers infusion (has no administration in time range)  dextrose 50 % solution 0-50 mL (has no administration in time range)  lactated ringers bolus 1,000 mL (has no administration in time range)  vancomycin (VANCOCIN) IVPB 1000 mg/200 mL premix (has no administration in time range)    ED Course/ Medical Decision Making/ A&P                           Medical Decision Making Risk Prescription drug management. Decision regarding hospitalization.   She appears to have cellulitis of her left foot with erythema and some drainage.  She does not have any obvious abscess on exam.  Foot x-ray images viewed by myself and on my interpretation there is no obvious osteomyelitis.  Her work-up shows no DKA but significant hyperglycemia with a glucose over 700.  Due to this degree of hyperglycemia in the setting of infection I think she will need admission and better glycemic control.  Her potassium is 5.6 so we will see how this trends with the insulin.  She was started on insulin drip.  She was also started on IV vancomycin.  She does have more swelling than just the area of erythema and so a  DVT ultrasound has been ordered.  Discussed case  and admission with Dr. Rogers Blocker.        Final Clinical Impression(s) / ED Diagnoses Final diagnoses:  Hyperglycemia  Cellulitis of left foot    Rx / DC Orders ED Discharge Orders     None         Sherwood Gambler, MD 01/10/22 1845

## 2022-01-11 ENCOUNTER — Other Ambulatory Visit (HOSPITAL_COMMUNITY): Payer: Self-pay

## 2022-01-11 ENCOUNTER — Inpatient Hospital Stay (HOSPITAL_COMMUNITY): Payer: Medicare Other

## 2022-01-11 ENCOUNTER — Telehealth (HOSPITAL_COMMUNITY): Payer: Self-pay | Admitting: Pharmacy Technician

## 2022-01-11 DIAGNOSIS — M7989 Other specified soft tissue disorders: Secondary | ICD-10-CM

## 2022-01-11 DIAGNOSIS — L97909 Non-pressure chronic ulcer of unspecified part of unspecified lower leg with unspecified severity: Secondary | ICD-10-CM

## 2022-01-11 DIAGNOSIS — E1165 Type 2 diabetes mellitus with hyperglycemia: Secondary | ICD-10-CM | POA: Diagnosis not present

## 2022-01-11 DIAGNOSIS — Z794 Long term (current) use of insulin: Secondary | ICD-10-CM | POA: Diagnosis not present

## 2022-01-11 LAB — BASIC METABOLIC PANEL
Anion gap: 9 (ref 5–15)
BUN: 16 mg/dL (ref 8–23)
CO2: 29 mmol/L (ref 22–32)
Calcium: 9 mg/dL (ref 8.9–10.3)
Chloride: 98 mmol/L (ref 98–111)
Creatinine, Ser: 0.57 mg/dL (ref 0.44–1.00)
GFR, Estimated: >60 mL/min (ref >60)
Glucose, Bld: 97 mg/dL (ref 70–99)
Potassium: 3.8 mmol/L (ref 3.5–5.1)
Sodium: 136 mmol/L (ref 135–145)

## 2022-01-11 LAB — VITAMIN B12: Vitamin B-12: 450 pg/mL (ref 180–914)

## 2022-01-11 LAB — CBC
HCT: 42.1 % (ref 36.0–46.0)
Hemoglobin: 14 g/dL (ref 12.0–15.0)
MCH: 29.7 pg (ref 26.0–34.0)
MCHC: 33.3 g/dL (ref 30.0–36.0)
MCV: 89.4 fL (ref 80.0–100.0)
Platelets: 289 10*3/uL (ref 150–400)
RBC: 4.71 MIL/uL (ref 3.87–5.11)
RDW: 14.4 % (ref 11.5–15.5)
WBC: 14.5 10*3/uL — ABNORMAL HIGH (ref 4.0–10.5)
nRBC: 0 % (ref 0.0–0.2)

## 2022-01-11 LAB — GLUCOSE, CAPILLARY
Glucose-Capillary: 141 mg/dL — ABNORMAL HIGH (ref 70–99)
Glucose-Capillary: 46 mg/dL — ABNORMAL LOW (ref 70–99)
Glucose-Capillary: 48 mg/dL — ABNORMAL LOW (ref 70–99)
Glucose-Capillary: 79 mg/dL (ref 70–99)
Glucose-Capillary: 151 mg/dL — ABNORMAL HIGH (ref 70–99)
Glucose-Capillary: 332 mg/dL — ABNORMAL HIGH (ref 70–99)

## 2022-01-11 LAB — TSH: TSH: 2.226 u[IU]/mL (ref 0.350–4.500)

## 2022-01-11 LAB — C-REACTIVE PROTEIN: CRP: 0.5 mg/dL (ref <1.0)

## 2022-01-11 LAB — RPR: RPR Ser Ql: NONREACTIVE

## 2022-01-11 MED ORDER — INSULIN ASPART 100 UNIT/ML FLEXPEN
5.0000 [IU] | PEN_INJECTOR | Freq: Three times a day (TID) | SUBCUTANEOUS | Status: DC
  Filled 2022-01-11: qty 3

## 2022-01-11 MED ORDER — APIXABAN 5 MG PO TABS: 5.0000 mg | ORAL_TABLET | Freq: Two times a day (BID) | ORAL | Status: DC

## 2022-01-11 MED ORDER — INSULIN GLARGINE-YFGN 100 UNIT/ML ~~LOC~~ SOLN
10.0000 [IU] | Freq: Every day | SUBCUTANEOUS | Status: DC
Start: 2022-01-11 — End: 2022-01-12
  Administered 2022-01-11: 10 [IU] via SUBCUTANEOUS
  Filled 2022-01-11 (×2): qty 0.1

## 2022-01-11 MED ORDER — INSULIN ASPART 100 UNIT/ML IJ SOLN
5.0000 [IU] | Freq: Three times a day (TID) | INTRAMUSCULAR | Status: DC
  Administered 2022-01-11: 5 [IU] via SUBCUTANEOUS

## 2022-01-11 MED ORDER — INSULIN ASPART 100 UNIT/ML IJ SOLN
0.0000 [IU] | Freq: Every day | INTRAMUSCULAR | Status: DC
  Administered 2022-01-11 – 2022-01-12 (×2): 4 [IU] via SUBCUTANEOUS

## 2022-01-11 MED ORDER — INSULIN ASPART 100 UNIT/ML IJ SOLN
0.0000 [IU] | Freq: Three times a day (TID) | INTRAMUSCULAR | Status: DC
  Administered 2022-01-11: 2 [IU] via SUBCUTANEOUS
  Administered 2022-01-11: 1 [IU] via SUBCUTANEOUS
  Administered 2022-01-12: 7 [IU] via SUBCUTANEOUS
  Administered 2022-01-12 (×2): 3 [IU] via SUBCUTANEOUS
  Administered 2022-01-13: 2 [IU] via SUBCUTANEOUS
  Administered 2022-01-13: 3 [IU] via SUBCUTANEOUS

## 2022-01-11 MED ORDER — INSULIN GLARGINE-YFGN 100 UNIT/ML ~~LOC~~ SOLN: 16.0000 [IU] | Freq: Every day | SUBCUTANEOUS | Status: DC

## 2022-01-11 MED ORDER — INSULIN GLARGINE-YFGN 100 UNIT/ML ~~LOC~~ SOLN
26.0000 [IU] | Freq: Every day | SUBCUTANEOUS | Status: DC
  Filled 2022-01-11: qty 0.26

## 2022-01-11 MED ORDER — APIXABAN 5 MG PO TABS
10.0000 mg | ORAL_TABLET | Freq: Two times a day (BID) | ORAL | Status: DC
  Administered 2022-01-11 – 2022-01-13 (×5): 10 mg via ORAL
  Filled 2022-01-11 (×5): qty 2

## 2022-01-11 MED ORDER — ACETAMINOPHEN 325 MG PO TABS
650.0000 mg | ORAL_TABLET | Freq: Four times a day (QID) | ORAL | Status: DC | PRN
Start: 2022-01-11 — End: 2022-01-13
  Administered 2022-01-11: 650 mg via ORAL
  Filled 2022-01-11: qty 2

## 2022-01-11 NOTE — TOC Benefit Eligibility Note (Signed)
Patient Teacher, English as a foreign language completed.    The patient is currently admitted and upon discharge could be taking Eliquis 5 mg.  The current 30 day co-pay is $0.00.   The patient is insured through [employer] [employer] [place], [place] Patient Advocate Specialist [place] Patient Advocate Team Direct Number: [phone]  Fax: [phone]

## 2022-01-11 NOTE — ED Notes (Signed)
Pt was desatting while sleeping so I asked her if she wears oxygen at home and said, "yes, I wear it all the time".  Pt is not on 2L.

## 2022-01-11 NOTE — Progress Notes (Signed)
ANTICOAGULATION CONSULT NOTE - Initial Consult  Pharmacy Consult for Eliquis Indication:  LLE DVT  Allergies  Allergen Reactions   Chlordiazepoxide-Clidinium Other (See Comments)    Sleepy,weak   Biaxin [Clarithromycin] Other (See Comments)    Foul taste, abd pain, diarrhea   Tape Other (See Comments)    SKIN IS VERY THIN AND WILL TEAR AND BRUISE EASILY!!   Ultram [Tramadol] Other (See Comments)    Caused tremors   Codeine Other (See Comments)    Upset the stomach    Patient Measurements: Height: 5' 3"  (160 cm) Weight: 55.8 kg (123 lb 0.3 oz) IBW/kg (Calculated) : 52.4  Vital Signs: Temp: 98.7 F (37.1 C) (09/06 0150) Temp Source: Oral (09/06 0150) BP: 119/55 (09/06 0805) Pulse Rate: 84 (09/06 0805)  Labs: Recent Labs    01/10/22 1342 01/10/22 1350 01/10/22 2059 01/11/22 0923 01/11/22 1054  HGB 15.8* 17.0*  --   --  14.0  HCT 49.2* 50.0*  --   --  42.1  PLT 96*  --   --   --  289  CREATININE 0.68  --  0.58 0.57  --     Estimated Creatinine Clearance: 43.3 mL/min (by C-G formula based on SCr of 0.57 mg/dL).   Medical History: Past Medical History:  Diagnosis Date   Acute ischemic stroke ([place]) 08/10/2021   Anxiety disorder, unspecified    Arthritis    Benign paroxysmal positional vertigo 08/09/2012   Chronic rhinitis    Cognitive communication deficit    Constipation, unspecified    COPD mixed type ([place])    on home O2   Diverticulosis    Esophageal stricture    Essential (primary) hypertension    Gastro-esophageal reflux disease without esophagitis    Glaucoma    Hiatal hernia    Hyperlipidemia, unspecified    Irritable bowel syndrome    Ischemic stroke ([place]) 08/09/2021   Presence of permanent cardiac pacemaker 01/14/2020   Spinal stenosis, lumbar region, without neurogenic claudication    Steatohepatitis    Thrombocytopenia, unspecified ([place])    Type 2 diabetes mellitus with diabetic polyneuropathy [facility])    Assessment:  86 yr old female admitted  01/10/22 with hyperglycemia and cellulitis in diabetic foot.  On ASA 81 mg and Plavix 75 mg daily PTA for hx CVA.  Plavix continued on admit.  To begin Eliquis for LLE DVT per duplex today.   Platelet count low on admit, back into normal range today.  Hx platelet counts 130s-300s in CHL.   Goal of Therapy:  Appropriate Eliquis regimen for indication Monitor platelets by anticoagulation protocol: Yes   Plan:  Eliquis 10 mg PO BID x 1 week, then 5 mg PO BID. CBC in am; monitor platelet count. Monitor for signs/ symptoms of bleeding. Follow up Plavix plans > continue or stop with need for anticoagulation.  Arty Baumgartner, [place] 01/11/2022,12:42 PM

## 2022-01-11 NOTE — Telephone Encounter (Signed)
Pharmacy Patient Advocate Encounter  Insurance verification completed.    The patient is insured through [employer] Part D   The patient is currently admitted and ran test claims for the following: Eliquis.  Copays and coinsurance results were relayed to Inpatient clinical team.

## 2022-01-11 NOTE — Progress Notes (Addendum)
PROGRESS NOTE    [name]  XNA:[id number] DOB: August 26, 1935 DOA: 01/10/2022 PCP: Wardell Honour, MD   Brief Narrative: 86 year old with past medical history significant for CVA, COPD with chronic respiratory failure, hypertension, GERD, hyperlipidemia, diabetes type 2, thrombocytopenia who presents complaining of high blood sugar.  Her Blood Sugar saying she was above 600.  She also complaining of left leg foot swelling.  She was started on doxycycline by PCP on 8/30.  Patient admitted with hyperglycemia and bilateral lower extremity cellulitis worse on the left.  He was started on IV insulin drip, she has been transitioned to long-acting insulin.  She episode of hypoglycemia.  She has been confused.  Assessment & Plan:   Principal Problem:   Hyperglycemia due to type 2 diabetes mellitus (HCC) Active Problems:   Cellulitis in diabetic foot (HCC)   Thrombocytopenia (HCC)   Hypertension   COPD mixed type with chronic respiratory failure    Pacemaker   Hyperlipidemia   GERD (gastroesophageal reflux disease)   History of CVA (cerebrovascular accident)   1-Hyperglycemia due to diabetes type 2, uncontrolled in the setting of cellulitis -She was initially treated with insulin drip, she has been transitioned to [facility] and 26 units daily.  Sliding scale insulin.  We will discontinue meal coverage scale 5 units due to hypoglycemic episode this morning. A1c 13-----previously more than 15 3 months ago. Plan to reduce semglee to 10 units due to low cbg.   Cellulitis bilateral lower extremity worse on the left Continue with IV ceftriaxone Redness improving  Chronic DVT involving left femoral vein, left popliteal vein and left peroneal veins: Started on Eliquis. High risk for propagation of blood clot.   Thrombocytopenia: Suspect related to acute infection, recent DVT Resolved.    Hypertension: Continue with Toprol  COPD chronic respiratory failure: Continue with  nebulizer  Pacemaker status post dual-chamber implanted 01/2020  Lipidemia: Continue with a statin  GERD: Continue with Protonix  History of CVA continue Plavix and statin  Acute metabolic encephalopathy  Confusion,. In setting of infection.  Check B 12, TSh,. RPR.   Right ankle brachial index mild right LE arterial diseases. Needs follow up with vascular.    Pressure Injury 01/11/22 Sacrum Mid;Medial Stage 1 -  Intact skin with non-blanchable redness of a localized area usually over a bony prominence. (Active)  01/11/22 0226  Location: Sacrum  Location Orientation: Mid;Medial  Staging: Stage 1 -  Intact skin with non-blanchable redness of a localized area usually over a bony prominence.  Wound Description (Comments):   Present on Admission: Yes  Dressing Type Foam - Lift dressing to assess site every shift 01/11/22 0800     Pressure Injury 01/11/22 Ischial tuberosity Left Stage 1 -  Intact skin with non-blanchable redness of a localized area usually over a bony prominence. (Active)  01/11/22 0227  Location: Ischial tuberosity  Location Orientation: Left  Staging: Stage 1 -  Intact skin with non-blanchable redness of a localized area usually over a bony prominence.  Wound Description (Comments):   Present on Admission: Yes  Dressing Type Foam - Lift dressing to assess site every shift 01/11/22 0800     Pressure Injury 01/11/22 Heel Left;Right Stage 1 -  Intact skin with non-blanchable redness of a localized area usually over a bony prominence. (Active)  01/11/22 0245  Location: Heel  Location Orientation: Left;Right  Staging: Stage 1 -  Intact skin with non-blanchable redness of a localized area usually over a bony prominence.  Wound Description (  Comments):   Present on Admission: Yes  Dressing Type Foam - Lift dressing to assess site every shift 01/11/22 0800      Estimated body mass index is 21.79 kg/m as calculated from the following:   Height as of this encounter:  5' 3"  (1.6 m).   Weight as of this encounter: 55.8 kg.   DVT prophylaxis: Eliquis Code Status: dnr Family Communication: care discussed with patient.  Disposition Plan:  Status is: Inpatient Remains inpatient appropriate because: management of cellulitis, dvt    Consultants:  none  Procedures:  Doppler.   Antimicrobials:    Subjective: She is alert, has been notice to be confuse and forgetful by her nurse. Her cbg is low.  She denies pain.   Objective: Vitals:   01/11/22 0107 01/11/22 0150 01/11/22 0805 01/11/22 1300  BP:  (!) 121/48 (!) 119/55 (!) 118/55  Pulse:  81 84 76  Resp:  20    Temp: 98.3 F (36.8 C) 98.7 F (37.1 C)  98.7 F (37.1 C)  TempSrc: Oral Oral  Oral  SpO2:  98% 90% 95%  Weight:  55.8 kg    Height:  5' 3"  (1.6 m)      Intake/Output Summary (Last 24 hours) at 01/11/2022 1555 Last data filed at 01/11/2022 1100 Gross per 24 hour  Intake 240 ml  Output 800 ml  Net -560 ml   Filed Weights   01/10/22 1305 01/11/22 0150  Weight: 57 kg 55.8 kg    Examination:  General exam: Appears calm and comfortable  Respiratory system: Clear to auscultation. Respiratory effort normal. Cardiovascular system: S1 & S2 heard, RRR.  Gastrointestinal system: Abdomen is nondistended, soft and nontender. No organomegaly or masses felt. Normal bowel sounds heard. Central nervous system: Alert and oriented. No focal neurological deficits. Extremities: Symmetric 5 x 5 power.   Data Reviewed: I have personally reviewed following labs and imaging studies  CBC: Recent Labs  Lab 01/10/22 1342 01/10/22 1350 01/11/22 1054  WBC 9.2  --  14.5*  NEUTROABS 6.8  --   --   HGB 15.8* 17.0* 14.0  HCT 49.2* 50.0* 42.1  MCV 94.8  --  89.4  PLT 96*  --  188   Basic Metabolic Panel: Recent Labs  Lab 01/10/22 1342 01/10/22 1350 01/10/22 2059 01/11/22 0923  NA 125* 122* 135 136  K 5.6* 5.4* 4.2 3.8  CL 89*  --  94* 98  CO2 25  --  30 29  GLUCOSE 720*  --  243* 97   BUN 26*  --  20 16  CREATININE 0.68  --  0.58 0.57  CALCIUM 9.3  --  9.0 9.0  MG 1.7  --   --   --    GFR: Estimated Creatinine Clearance: 43.3 mL/min (by C-G formula based on SCr of 0.57 mg/dL). Liver Function Tests: Recent Labs  Lab 01/10/22 1342  AST 23  ALT 18  ALKPHOS 79  BILITOT 0.6  PROT 7.5  ALBUMIN 3.6   Recent Labs  Lab 01/10/22 1342  LIPASE 69*   No results for input(s): "AMMONIA" in the last 168 hours. Coagulation Profile: No results for input(s): "INR", "PROTIME" in the last 168 hours. Cardiac Enzymes: No results for input(s): "CKTOTAL", "CKMB", "CKMBINDEX", "TROPONINI" in the last 168 hours. BNP (last 3 results) No results for input(s): "PROBNP" in the last 8760 hours. HbA1C: Recent Labs    01/10/22 2059  HGBA1C 13.2*   CBG: Recent Labs  Lab 01/10/22 2327 01/11/22  0615 01/11/22 1047 01/11/22 1058 01/11/22 1107  GLUCAP 87 141* 48* 46* 79   Lipid Profile: No results for input(s): "CHOL", "HDL", "LDLCALC", "TRIG", "CHOLHDL", "LDLDIRECT" in the last 72 hours. Thyroid Function Tests: Recent Labs    01/11/22 1054  TSH 2.226   Anemia Panel: Recent Labs    01/11/22 1054  VITAMINB12 450   Sepsis Labs: No results for input(s): "PROCALCITON", "LATICACIDVEN" in the last 168 hours.  No results found for this or any previous visit (from the past 240 hour(s)).       Radiology Studies: VAS [place] ABI WITH/WO TBI  Result Date: 01/11/2022  LOWER EXTREMITY DOPPLER STUDY Patient Name:  [name]  Date of Exam:   01/11/2022 Medical Rec #: [id number]         Accession #:    [id number] Date of Birth: 11/19/1935         Patient Gender: F Patient Age:   89 years Exam Location:  [facility] Procedure:      VAS [place] ABI WITH/WO TBI Referring Phys: Orma Flaming --------------------------------------------------------------------------------  Indications: Ulceration. High Risk Factors: Hypertension, hyperlipidemia, Diabetes.  Limitations: Today's exam  was limited due to an open wound and bandages. Comparison Study: No prior studies. Performing Technologist: Carlos Levering RVT  Examination Guidelines: A complete evaluation includes at minimum, Doppler waveform signals and systolic blood pressure reading at the level of bilateral brachial, anterior tibial, and posterior tibial arteries, when vessel segments are accessible. Bilateral testing is considered an integral part of a complete examination. Photoelectric Plethysmograph (PPG) waveforms and toe systolic pressure readings are included as required and additional duplex testing as needed. Limited examinations for reoccurring indications may be performed as noted.  ABI Findings: +---------+------------------+-----+----------+--------+ Right    Rt Pressure (mmHg)IndexWaveform  Comment  +---------+------------------+-----+----------+--------+ Brachial 124                    triphasic          +---------+------------------+-----+----------+--------+ PTA      101               0.81 monophasic         +---------+------------------+-----+----------+--------+ DP       108               0.87 monophasic         +---------+------------------+-----+----------+--------+ Great Toe42                0.34                    +---------+------------------+-----+----------+--------+ +---------+------------------+-----+----------+-------+ Left     Lt Pressure (mmHg)IndexWaveform  Comment +---------+------------------+-----+----------+-------+ Brachial 110                    triphasic         +---------+------------------+-----+----------+-------+ PTA      138               1.11 monophasic        +---------+------------------+-----+----------+-------+ DP       134               1.08 monophasic        +---------+------------------+-----+----------+-------+ Great Toe19                0.15                   +---------+------------------+-----+----------+-------+  +-------+-----------+-----------+------------+------------+ ABI/TBIToday's ABIToday's TBIPrevious ABIPrevious TBI +-------+-----------+-----------+------------+------------+ Right  0.87  0.34                                +-------+-----------+-----------+------------+------------+ Left   1.11       0.15                                +-------+-----------+-----------+------------+------------+  Summary: Right: Resting right ankle-brachial index indicates mild right lower extremity arterial disease. The right toe-brachial index is abnormal. Left: Resting left ankle-brachial index is within normal range. The left toe-brachial index is abnormal. Pressures are likely falsely elevated due to medial calcification. *See table(s) above for measurements and observations.     Preliminary    VAS [place] LOWER EXTREMITY VENOUS (DVT) (7a-7p)  Result Date: 01/11/2022  Lower Venous DVT Study Patient Name:  [name]  Date of Exam:   01/11/2022 Medical Rec #: [id number]         Accession #:    [id number] Date of Birth: 1935/09/23         Patient Gender: F Patient Age:   29 years Exam Location:  [facility] Procedure:      VAS [place] LOWER EXTREMITY VENOUS (DVT) Referring Phys: Nicki Reaper [place] --------------------------------------------------------------------------------  Indications: Swelling.  Risk Factors: None identified. Limitations: Bandages and open wound. Comparison Study: No prior studies. Performing Technologist: Oliver Hum RVT  Examination Guidelines: A complete evaluation includes B-mode imaging, spectral Doppler, color Doppler, and power Doppler as needed of all accessible portions of each vessel. Bilateral testing is considered an integral part of a complete examination. Limited examinations for reoccurring indications may be performed as noted. The reflux portion of the exam is performed with the patient in reverse Trendelenburg.   +-----+---------------+---------+-----------+----------+--------------+ RIGHTCompressibilityPhasicitySpontaneityPropertiesThrombus Aging +-----+---------------+---------+-----------+----------+--------------+ CFV  Full           Yes      Yes                                 +-----+---------------+---------+-----------+----------+--------------+   +---------+---------------+---------+-----------+----------+--------------+ LEFT     CompressibilityPhasicitySpontaneityPropertiesThrombus Aging +---------+---------------+---------+-----------+----------+--------------+ CFV      Full           Yes      Yes                                 +---------+---------------+---------+-----------+----------+--------------+ SFJ      Full                                                        +---------+---------------+---------+-----------+----------+--------------+ FV Prox  Partial        No       No                   Chronic        +---------+---------------+---------+-----------+----------+--------------+ FV Mid   Full                                                        +---------+---------------+---------+-----------+----------+--------------+  FV DistalFull                                                        +---------+---------------+---------+-----------+----------+--------------+ PFV      Full                                                        +---------+---------------+---------+-----------+----------+--------------+ POP      Partial        No       No                   Chronic        +---------+---------------+---------+-----------+----------+--------------+ PTV      Full                                                        +---------+---------------+---------+-----------+----------+--------------+ PERO     Partial                                      Chronic         +---------+---------------+---------+-----------+----------+--------------+ Thrombus located in the popliteal vein is noted to only be in the distal segment.   Summary: RIGHT: - No evidence of common femoral vein obstruction.  LEFT: - Findings consistent with chronic deep vein thrombosis involving the left femoral vein, left popliteal vein, and left peroneal veins. - No cystic structure found in the popliteal fossa.  *See table(s) above for measurements and observations.    Preliminary    DG Foot Complete Left  Result Date: 01/10/2022 CLINICAL DATA:  Left foot redness and swelling. Pain. Recently treated for cellulitis by PCP. EXAM: LEFT FOOT - COMPLETE 3+ VIEW COMPARISON:  Left ankle radiographs 12/28/2021 FINDINGS: Minimal lateral great toe metatarsophalangeal joint space narrowing and peripheral osteophytosis. No acute fracture or dislocation. Mild-to-moderate midfoot and hindfoot greater than forefoot soft tissue swelling. IMPRESSION: Soft tissue swelling.  No acute bone abnormality. Electronically Signed   By: Yvonne Kendall M.D.   On: 01/10/2022 14:09        Scheduled Meds:  apixaban  10 mg Oral BID   Followed by   Derrill Memo ON 01/18/2022] apixaban  5 mg Oral BID   arformoterol  15 mcg Nebulization BID   And   umeclidinium bromide  1 puff Inhalation Daily   budesonide  0.25 mg Inhalation BID   busPIRone  5 mg Oral TID   clopidogrel  75 mg Oral Daily   furosemide  20 mg Oral Daily   insulin aspart  0-5 Units Subcutaneous QHS   insulin aspart  0-9 Units Subcutaneous TID WC   insulin glargine-yfgn  26 Units Subcutaneous QHS   metoprolol succinate  50 mg Oral BID   multivitamin with minerals  1 tablet Oral Daily   pantoprazole  40 mg Oral Daily   pravastatin  20 mg Oral Daily   thiamine  100 mg Oral Daily  Continuous Infusions:  cefTRIAXone (ROCEPHIN)  IV Stopped (01/10/22 2328)   vancomycin       LOS: 1 day    Time spent: 35 minutes.     Elmarie Shiley, MD [facility]   If 7PM-7AM, please contact night-coverage [email or web address]  01/11/2022, 3:55 PM

## 2022-01-11 NOTE — Inpatient Diabetes Management (Signed)
Inpatient Diabetes Program Recommendations  AACE/ADA: New Consensus Statement on Inpatient Glycemic Control (2015)  Target Ranges:  Prepandial:   less than 140 mg/dL      Peak postprandial:   less than 180 mg/dL (1-2 hours)      Critically ill patients:  140 - 180 mg/dL   Lab Results  Component Value Date   GLUCAP 141 (H) 01/11/2022   HGBA1C 13.2 (H) 01/10/2022    Review of Glycemic Control  Diabetes history: DM 2 Outpatient Diabetes medications: Novolog 5 units tid, Tresiba 26 units qhs Current orders for Inpatient glycemic control:  Semglee 26 units qhs Novolog 0-9 units tid + hs Novolog 5 units tid meal coverage  A1c 13.2% on 9/5   Diabetes Coordinator spoke with pt during last admission on 8/4. Pt has had multiple admissions. At that time pt cannot remember names and doses of insulins at home. We recommended a psych evaluation for capacity which pt still met. [facility] was contacted to follow up with pt after discharge. [facility] reached out several times without response from pt. Unsure of what other resources can be provided and done for pt. Pt needs assistance at home with medications.  Thanks,    RN, MSN, BC-ADM Inpatient Diabetes Coordinator Team Pager [phone] (8a-5p)      

## 2022-01-11 NOTE — Evaluation (Signed)
Physical Therapy Evaluation Patient Details Name: AYLSSA HERRIG MRN: [id number] DOB: 1936-04-10 Today's Date: 01/11/2022  History of Present Illness  Pt is an 86 y.o. female admitted 01/10/22 with concerns for high blood sugars, LLE swelling. [place] for hyperglycemia, L foot cellulitis. Venous duplex findings 9/6 consistent with chronic LLE DVTs. PMH includes stroke, COPD (home O2), HLD, DM2, spinal stenosis, CHB s/p pacemaker, HTN, cognitive impairment, anxiety, glaucoma.  Clinical Impression  Pt presents to PT with deficits in gait, balance, functional mobility, and cognition. Pt with significantly impaired short term memory, often reporting a desire to not sit up and then less than 10 seconds later reporting that she would be fine to sit up and eat dinner. Pt also with no recall of the date or day of the week. Pt has a significant history of falls, and only reports caregiver support for 6 hours a day. Pt remains at a high risk for falls, and appears to be at a high risk for mismanaging medications due to memory deficits. PT recommends SNF placement at this time, or return home with increased caregiver support (would benefit from supervision for all out of bed activities).      Recommendations for follow up therapy are one component of a multi-disciplinary discharge planning process, led by the attending physician.  Recommendations may be updated based on patient status, additional functional criteria and insurance authorization.  Follow Up Recommendations Skilled nursing-short term rehab (<3 hours/day) (may benefit from ALF or memory care pending cognitive assessment) Can patient physically be transported by private vehicle: Yes    Assistance Recommended at Discharge Frequent or constant Supervision/Assistance  Patient can return home with the following  A little help with walking and/or transfers;A lot of help with bathing/dressing/bathroom;Assistance with cooking/housework;Direct  supervision/assist for medications management;Direct supervision/assist for financial management;Assist for transportation;Help with stairs or ramp for entrance    Equipment Recommendations None recommended by PT  Recommendations for Other Services       Functional Status Assessment Patient has had a recent decline in their functional status and demonstrates the ability to make significant improvements in function in a reasonable and predictable amount of time.     Precautions / Restrictions Precautions Precautions: Fall Precaution Comments: frequent falls Restrictions Weight Bearing Restrictions: No      Mobility  Bed Mobility Overal bed mobility: Needs Assistance Bed Mobility: Supine to Sit, Sit to Supine     Supine to sit: Min assist, HOB elevated Sit to supine: Min guard   General bed mobility comments: use of rail    Transfers Overall transfer level: Needs assistance Equipment used: Rolling walker (2 wheels) Transfers: Sit to/from Stand Sit to Stand: Min assist           General transfer comment: verbal and tactile cues to increased trunk flexion. Pt with one failed sit to stand attempt prior to success, posterior lean    Ambulation/Gait Ambulation/Gait assistance: Min guard Gait Distance (Feet): 15 Feet Assistive device: Rolling walker (2 wheels) Gait Pattern/deviations: Step-to pattern Gait velocity: reduced Gait velocity interpretation: <1.31 ft/sec, indicative of household ambulator   General Gait Details: pt with slowed step-to gait, reduced foot clearance bilaterally  Stairs            Wheelchair Mobility    Modified Rankin (Stroke Patients Only)       Balance Overall balance assessment: Needs assistance Sitting-balance support: No upper extremity supported, Feet supported Sitting balance-Leahy Scale: Fair     Standing balance support: Single extremity supported, Bilateral  upper extremity supported, Reliant on assistive device for  balance Standing balance-Leahy Scale: Poor                               Pertinent Vitals/Pain Pain Assessment Pain Assessment: Faces Faces Pain Scale: Hurts even more Pain Location: back Pain Descriptors / Indicators: Grimacing Pain Intervention(s): Monitored during session    Home Living Family/patient expects to be discharged to:: Skilled nursing facility                   Additional Comments: likely needs more frequent assistance due to cognitive deficits and frequent falls    Prior Function Prior Level of Function : Needs assist;Patient poor historian/Family not available             Mobility Comments: pt ambulates with use of RW in the home ADLs Comments: pt reports completing most ADLs, however does present confused with very poor short term memory     Hand Dominance   Dominant Hand: Right    Extremity/Trunk Assessment   Upper Extremity Assessment Upper Extremity Assessment: Generalized weakness    Lower Extremity Assessment Lower Extremity Assessment: Generalized weakness    Cervical / Trunk Assessment Cervical / Trunk Assessment: Kyphotic  Communication   Communication: No difficulties  Cognition Arousal/Alertness: Awake/alert Behavior During Therapy: WFL for tasks assessed/performed Overall Cognitive Status: Impaired/Different from baseline Area of Impairment: Orientation, Memory, Safety/judgement, Awareness, Problem solving                 Orientation Level: Disoriented to, Time   Memory: Decreased recall of precautions, Decreased short-term memory   Safety/Judgement: Decreased awareness of safety Awareness: Emergent Problem Solving: Difficulty sequencing          General Comments General comments (skin integrity, edema, etc.): VSS on RA    Exercises     Assessment/Plan    PT Assessment Patient needs continued PT services  PT Problem List Decreased strength;Decreased activity tolerance;Decreased  balance;Decreased mobility;Decreased cognition;Decreased knowledge of use of DME;Decreased safety awareness;Pain       PT Treatment Interventions DME instruction;Gait training;Functional mobility training;Stair training;Therapeutic activities;Therapeutic exercise;Balance training;Neuromuscular re-education;Cognitive remediation;Patient/family education    PT Goals (Current goals can be found in the Care Plan section)  Acute Rehab PT Goals Patient Stated Goal: to go home PT Goal Formulation: With patient Time For Goal Achievement: 01/25/22 Potential to Achieve Goals: Fair    Frequency Min 3X/week     Co-evaluation               AM-PAC PT "6 Clicks" Mobility  Outcome Measure Help needed turning from your back to your side while in a flat bed without using bedrails?: A Little Help needed moving from lying on your back to sitting on the side of a flat bed without using bedrails?: A Little Help needed moving to and from a bed to a chair (including a wheelchair)?: A Little Help needed standing up from a chair using your arms (e.g., wheelchair or bedside chair)?: A Little Help needed to walk in hospital room?: A Little Help needed climbing 3-5 steps with a railing? : Total 6 Click Score: 16    End of Session   Activity Tolerance: Patient tolerated treatment well Patient left: in bed;with call bell/phone within reach;with bed alarm set Nurse Communication: Mobility status PT Visit Diagnosis: Other abnormalities of gait and mobility (R26.89);History of falling (Z91.81)    Time: 8144-8185 PT Time Calculation (min) (ACUTE  ONLY): 13 min   Charges:   PT Evaluation $PT Eval Low Complexity: 1 Low          Zenaida Niece, PT, DPT Acute Rehabilitation Office [place] 01/11/2022, 5:42 PM

## 2022-01-11 NOTE — TOC Progression Note (Signed)
Transition of Care [facility]) - Progression Note    Patient Details  Name: [name] MRN: [id number] Date of Birth: April 30, 1936  Transition of Care [facility]) CM/SW Contact  Zenon Mayo, RN Phone Number: 01/11/2022, 12:06 PM  Clinical Narrative:    Patient is active with [facility] for [place], [place], [place], and Education officer, museum.         Expected Discharge Plan and Services                                                 Social Determinants of Health (SDOH) Interventions    Readmission Risk Interventions     No data to display

## 2022-01-11 NOTE — Progress Notes (Signed)
Left lower extremity venous duplex and ABI's have been completed. Preliminary results can be found in CV Proc through chart review.  Results were given to the patient's nurse, Gretta Cool.  01/11/22 12:18 PM Carlos Levering RVT

## 2022-01-11 NOTE — TOC Initial Note (Addendum)
Transition of Care [facility]) - Initial/Assessment Note    Patient Details  Name: [name] MRN: [id number] Date of Birth: 1935-11-23  Transition of Care [facility]) CM/SW Contact:    Bethann Berkshire, [place] Phone Number: 01/11/2022, 1:32 PM  Clinical Narrative:                  Pt not oriented to situation. CSW called pt's "friend," Gerald Stabs, listed in chart to complete SDOH transportation screen and get details of living situation. Gerald Stabs is a former neighbor(10 years ago) who now lives in [place]. She is able to provide information regarding pt's living situation though defers to pt's primary care giver, [name]([phone]), for additional details. Pt lives alone but has CAP services; 2 care givers provide care during the day. Pt does not have transportation issues as her caregivers drive her to appointments and do her shopping. Gerald Stabs explains pt has no family. She has been to [facility] for rehab in the past and had a positive experience there but has refused LTC in the past. Pt has an ex husband who is not involved and lives in [place]. Gerald Stabs states that at some point in the past "welfare" was involved though it is unclear if she means DSS/APS was involved with pt. Upon chart review, Gerald Stabs may have been referring to pt's CAPS case worker who is listed as Dolan Amen on 12/09/21. It is also documented on 12/09/21 that [place] was active with pt with them for [facility] and that "Patient has cancelled several [place] visits  and refused visit from [facility]"  CSW called pt's care giver Chaves [phone]) who provides additional details. Pt's caregivers are employed through [employer] home [facility]. [name] comes 9am-12pm and then another caregiver comes from 12pm-4pm. [name] returns from 5pm-630/7pm in the evenings. [name] reports this is pt's caregiver schedule 7days/week. She confirms that caregivers provide pt transportation. [name] states that one of pt's doctors has recommended ALF in the past but that pt refused this. [name] states that  there has never been APS involvement. She does not have phone number for Teacher, English as a foreign language)  Expected Discharge Plan:  (TBD) Barriers to Discharge: Continued Medical Work up   Patient Goals and CMS Choice        Expected Discharge Plan and Services Expected Discharge Plan:  (TBD)       Living arrangements for the past 2 months: Apartment                                      Prior Living Arrangements/Services Living arrangements for the past 2 months: Apartment Lives with:: Self, Other (Comment) (has care givers during the day 9a - 630/7p 7days/week) Patient language and need for interpreter reviewed:: Yes        Need for Family Participation in Patient Care: Yes (Comment) Care giver support system in place?: Yes (comment)   Criminal Activity/Legal Involvement Pertinent to Current Situation/Hospitalization: No - Comment as needed  Activities of Daily Living      Permission Sought/Granted                  Emotional Assessment       Orientation: : Oriented to Self, Oriented to Place Alcohol / Substance Use: Not Applicable Psych Involvement: No (comment)  Admission diagnosis:  Hyperglycemia [R73.9] Cellulitis of left foot [L03.116] Hyperglycemia due to type 2 diabetes mellitus ([place]) [E11.65] Patient Active Problem List  Diagnosis Date Noted   Hyperglycemia due to type 2 diabetes mellitus ([place]) 01/10/2022   Cellulitis in diabetic foot ([place]) 01/10/2022   History of CVA (cerebrovascular accident) 01/10/2022   Dementia ([place]) 12/09/2021   Polycythemia 12/09/2021   UTI (urinary tract infection) 12/08/2021   Fall    Stricture and stenosis of esophagus    Abnormal esophagram    Dysphagia    Protein-calorie malnutrition, severe 10/03/2021   DKA (diabetic ketoacidosis) ([place]) 10/02/2021   Acute cystitis 10/02/2021   Diabetic foot ulcers ([place]) - right foot 10/02/2021   Seborrheic dermatitis 10/12/2020   Pain due to onychomycosis of toenails of both  feet 09/01/2020   Sjogren's syndrome with keratoconjunctivitis sicca ([place]) 06/07/2020   Mild cognitive impairment with memory loss 01/29/2020   Full code status 01/26/2020   Pacemaker 01/19/2020   Pressure injury of skin 01/14/2020   Complete heart block (HCC)    Acute on chronic respiratory failure with hypoxia ([place])    Scoliosis due to degenerative disease of spine in adult patient 09/10/2018   Radicular pain of left lower extremity 09/10/2018   Hand arthritis 10/11/2017   Uncontrolled type 2 diabetes mellitus with hyperglycemia, with long-term current use of insulin ([place]) 03/08/2017   Diarrhea in adult patient 11/08/2016   Thrombocytopenia ([place]) 11/08/2016   Degenerative lumbar spinal stenosis 10/27/2015   Arthralgia 09/22/2015   Chronic respiratory failure with hypoxia ([place]) 01/11/2015   Diabetic polyneuropathy associated with type 2 diabetes mellitus ([place]) 11/16/2014   Hypertension 05/19/2013   Vertigo 05/11/2013   Dizziness 05/11/2013   Hyperlipidemia 02/15/2013   Type II diabetes mellitus, uncontrolled 01/01/2013   Benign paroxysmal positional vertigo 08/09/2012   Type II or unspecified type diabetes mellitus with neurological manifestations, uncontrolled(250.62) 08/09/2012   GLAUCOMA 03/16/2010   COPD mixed type with chronic respiratory failure  01/15/2009   Seasonal and perennial allergic rhinitis 11/23/2007   CHRONIC RHINITIS 07/16/2007   GERD (gastroesophageal reflux disease) 05/29/2007   I B S-DIARRHEAL PREDOMINATE 05/29/2007   FATTY LIVER DISEASE 05/29/2007   PCP:  Wardell Honour, MD Pharmacy:   [facility] [place] [address] Phone: [phone] Fax: [place] [phone] - [place], [place] [address] [address] Phone: [phone] Fax: [place], [place] [address]. Melina Modena [address] [place] [place] [address] Phone: [phone] Fax: [phone]     Social Determinants of Health (SDOH) Interventions Transportation Interventions: Intervention Not Indicated (Pt friend, Gerald Stabs, states pt has caregivers that transport her to appointments and get her groceries)  Readmission Risk Interventions     No data to display

## 2022-01-12 DIAGNOSIS — Z794 Long term (current) use of insulin: Secondary | ICD-10-CM | POA: Diagnosis not present

## 2022-01-12 DIAGNOSIS — E1165 Type 2 diabetes mellitus with hyperglycemia: Secondary | ICD-10-CM | POA: Diagnosis not present

## 2022-01-12 LAB — BASIC METABOLIC PANEL
Anion gap: 10 (ref 5–15)
BUN: 16 mg/dL (ref 8–23)
CO2: 28 mmol/L (ref 22–32)
Calcium: 8.8 mg/dL — ABNORMAL LOW (ref 8.9–10.3)
Chloride: 95 mmol/L — ABNORMAL LOW (ref 98–111)
Creatinine, Ser: 0.71 mg/dL (ref 0.44–1.00)
GFR, Estimated: >60 mL/min (ref >60)
Glucose, Bld: 279 mg/dL — ABNORMAL HIGH (ref 70–99)
Potassium: 4 mmol/L (ref 3.5–5.1)
Sodium: 133 mmol/L — ABNORMAL LOW (ref 135–145)

## 2022-01-12 LAB — CBC
HCT: 41.7 % (ref 36.0–46.0)
Hemoglobin: 13.8 g/dL (ref 12.0–15.0)
MCH: 30.3 pg (ref 26.0–34.0)
MCHC: 33.1 g/dL (ref 30.0–36.0)
MCV: 91.4 fL (ref 80.0–100.0)
Platelets: 200 10*3/uL (ref 150–400)
RBC: 4.56 MIL/uL (ref 3.87–5.11)
RDW: 14.3 % (ref 11.5–15.5)
WBC: 7.9 10*3/uL (ref 4.0–10.5)
nRBC: 0 % (ref 0.0–0.2)

## 2022-01-12 LAB — GLUCOSE, CAPILLARY
Glucose-Capillary: 328 mg/dL — ABNORMAL HIGH (ref 70–99)
Glucose-Capillary: 210 mg/dL — ABNORMAL HIGH (ref 70–99)
Glucose-Capillary: 239 mg/dL — ABNORMAL HIGH (ref 70–99)
Glucose-Capillary: 373 mg/dL — ABNORMAL HIGH (ref 70–99)

## 2022-01-12 MED ORDER — INSULIN GLARGINE-YFGN 100 UNIT/ML ~~LOC~~ SOLN
15.0000 [IU] | Freq: Every day | SUBCUTANEOUS | Status: DC
  Filled 2022-01-12: qty 0.15

## 2022-01-12 MED ORDER — INSULIN GLARGINE-YFGN 100 UNIT/ML ~~LOC~~ SOLN
20.0000 [IU] | Freq: Every day | SUBCUTANEOUS | Status: DC
  Administered 2022-01-12: 20 [IU] via SUBCUTANEOUS
  Filled 2022-01-12 (×2): qty 0.2

## 2022-01-12 NOTE — TOC Progression Note (Signed)
Transition of Care [facility]) - Progression Note    Patient Details  Name: [name] MRN: [id number] Date of Birth: [birth year]  Transition of Care [facility]) CM/SW [place], [place] Phone Number: 01/12/2022, 10:25 AM  Clinical Narrative:     CSW met with pt bedside to discuss disposition. She states she lives in an apartment alone in [place] and provides correct address that is listed in chart. She states she has an Engineer, production, [name], who comes at 9 am and stays till the evening. She denied that a 2nd aide switches with [name] in the middle of that time frame. CSW explains PT recommendation for SNF. Pt is familiar with SNFs and speaks about how good the food is at [facility] which is where she went for rehab earlier this year. Despite this, she is adamant about not going to SNF. She states "I've been to 2-3 and they are all the same. They don't do nothin." CSW inquires about ALF; pt states she had considered it at one point but does not want to go. She states "I don't like people. I just want to be left alone" and goes on to complain about too many people coming to her house and that they don't knock(seems to be referring to [facility]). She explains she is satisfied with her aide [name] and that [name] helps her with meds, meals, groceries, etc. CSW inquires about CAPS caseworker Sadat Raja. Pt is familiar with her but states that Raja's phone number is in a book at home. CSW inquired if [name] could obtain that number from her home; pt states "no and I don't want her to. I want everyone to stay out of my business. I want you to stay out of my business." Pt maintains she does not want to go to SNF or ALF; she wants to go home and wants an update about anticipated DC date. She is generally calm and cordial throughout this interaction despite intermittent frustration. She states "I don't mean to be ugly" and expresses appreciation for CSW. MD and RN notified via secure chat.   Expected Discharge Plan:  (TBD) Barriers  to Discharge: Continued Medical Work up  Expected Discharge Plan and Services Expected Discharge Plan:  (TBD)       Living arrangements for the past 2 months: Apartment                                       Social Determinants of Health (SDOH) Interventions Transportation Interventions: Intervention Not Indicated (Pt friend, Gerald Stabs, states pt has caregivers that transport her to appointments and get her groceries)  Readmission Risk Interventions     No data to display

## 2022-01-12 NOTE — Evaluation (Signed)
Occupational Therapy Evaluation Patient Details Name: [name] MRN: [id number] DOB: 1936-03-12 Today's Date: 01/12/2022   History of Present Illness Pt is an 86 y.o. female admitted 01/10/22 with concerns for high blood sugars, LLE swelling. [place] for hyperglycemia, L foot cellulitis. Venous duplex findings 9/6 consistent with chronic LLE DVTs. PMH includes stroke, COPD (home O2), HLD, DM2, spinal stenosis, CHB s/p pacemaker, HTN, cognitive impairment, anxiety, glaucoma.   Clinical Impression   Pt admitted as above presenting with deficits as listed below. Pt seen for OT initial assessment and functional mobility, bed mobility, LB dressing and transfers. Pt was educated in role of OT x3 but appears to have Short term memory/cognitive deficits that are impacting carryover. Pt states that she does not have any family nearby and is hopeful to go home. She has a h/o frequent falls which combined with her current cognitive status may require more frequent assist such as SNF. Will follow acutely to assist in maximizing independence with ADL's and functional transfers.     Recommendations for follow up therapy are one component of a multi-disciplinary discharge planning process, led by the attending physician.  Recommendations may be updated based on patient status, additional functional criteria and insurance authorization.   Follow Up Recommendations  Skilled nursing-short term rehab (<3 hours/day)    Assistance Recommended at Discharge Frequent or constant Supervision/Assistance  Patient can return home with the following A little help with walking and/or transfers;A little help with bathing/dressing/bathroom;Assistance with cooking/housework;Assist for transportation;Direct supervision/assist for medications management;Help with stairs or ramp for entrance;Direct supervision/assist for financial management    Functional Status Assessment  Patient has had a recent decline in their functional  status and demonstrates the ability to make significant improvements in function in a reasonable and predictable amount of time.  Equipment Recommendations  Other (comment) (Defer to next venue)    Recommendations for Other Services       Precautions / Restrictions Precautions Precautions: Fall Precaution Comments: frequent falls      Mobility Bed Mobility Overal bed mobility: Needs Assistance Bed Mobility: Supine to Sit, Sit to Supine     Supine to sit: Min assist, HOB elevated Sit to supine: Min assist   General bed mobility comments: use of rail and assist with LE's for repositioning when back to bed    Transfers Overall transfer level: Needs assistance Equipment used: Rolling walker (2 wheels) Transfers: Sit to/from Stand Sit to Stand: Min assist, Mod assist     General transfer comment: verbal and tactile cues secondary to increased trunk forward flexion. Pt with one failed sit to stand attempt prior to success, posterior lean      Balance Overall balance assessment: Needs assistance Sitting-balance support: Single extremity supported, Feet supported Sitting balance-Leahy Scale: Fair     Standing balance support: Bilateral upper extremity supported, Reliant on assistive device for balance Standing balance-Leahy Scale: Poor     ADL either performed or assessed with clinical judgement   ADL Overall ADL's : Needs assistance/impaired Eating/Feeding: Set up;Bed level   Grooming: Wash/dry hands;Wash/dry face;Set up;Bed level   Upper Body Bathing: Set up;Bed level   Lower Body Bathing: Moderate assistance;Sitting/lateral leans;Sit to/from stand   Upper Body Dressing : Minimal assistance;Sitting;Bed level   Lower Body Dressing: Sit to/from stand;Sitting/lateral leans;Moderate assistance;Maximal assistance   Toilet Transfer: BSC/3in1;Rolling walker (2 wheels);Cueing for sequencing;Cueing for safety;Stand-pivot;Ambulation;Moderate assistance Toilet Transfer  Details (indicate cue type and reason): Simulated sit to stand and side step at bed as pt declined sitting up  in chair or further ambulation secondary to back pain and wanting to rest in bed Toileting- Clothing Manipulation and Hygiene: Sitting/lateral lean;Moderate assistance;Maximal assistance;Sit to/from stand   Functional mobility during ADLs: Cueing for safety;Cueing for sequencing;Rolling walker (2 wheels);Moderate assistance General ADL Comments: Pt seen for OT initial assessment and functional mobility, bed mobility, LB dressing and for transfers. Pt was educated in role of OT x3 but appears to have Short term memory/cognitive deficits that are impacting carryover. Pt states that she does not have any family nearby and is hopeful to go home. She has a h/o frequent falls which combined with her current cognitive status may require more frequent assist such as SNF. Will follow actutely.     Vision Patient Visual Report: Other (comment) (Pt states that she has glasses at home for reading but she loses them, "the cat had moved them behind my bed")              Pertinent Vitals/Pain Pain Assessment Pain Assessment: Faces Faces Pain Scale: Hurts even more Pain Location: Low back Pain Descriptors / Indicators: Grimacing Pain Intervention(s): Monitored during session, Limited activity within patient's tolerance, Repositioned     Hand Dominance Right   Extremity/Trunk Assessment Upper Extremity Assessment Upper Extremity Assessment: Generalized weakness   Lower Extremity Assessment Lower Extremity Assessment: Generalized weakness;Defer to PT evaluation   Cervical / Trunk Assessment Cervical / Trunk Assessment: Kyphotic   Communication Communication Communication: No difficulties   Cognition Arousal/Alertness: Awake/alert Behavior During Therapy: WFL for tasks assessed/performed Overall Cognitive Status: Impaired/Different from baseline Area of Impairment: Orientation, Memory,  Safety/judgement, Awareness, Problem solving       Orientation Level: Disoriented to, Time, Situation   Memory: Decreased recall of precautions, Decreased short-term memory   Safety/Judgement: Decreased awareness of safety Awareness: Emergent Problem Solving: Difficulty sequencing                  Home Living Family/patient expects to be discharged to:: Skilled nursing facility    Additional Comments: likely needs more frequent assistance due to cognitive deficits and frequent falls      Prior Functioning/Environment Prior Level of Function : Needs assist;Patient poor historian/Family not available  Cognitive Assist : ADLs (cognitive)     Mobility Comments: pt ambulates with use of RW in the home ADLs Comments: pt reports completing most ADLs, however at present, she is confused with very poor short term memory. She states that she has HHA for assist with "things" and "going to get me things that I need or want" re: meals        OT Problem List: Decreased knowledge of use of DME or AE;Decreased knowledge of precautions;Decreased activity tolerance;Decreased cognition;Impaired balance (sitting and/or standing);Decreased safety awareness;Pain      OT Treatment/Interventions: Self-care/ADL training;Patient/family education;Therapeutic activities;DME and/or AE instruction    OT Goals(Current goals can be found in the care plan section) Acute Rehab OT Goals Patient Stated Goal: Get home to her cats OT Goal Formulation: Patient unable to participate in goal setting Time For Goal Achievement: 01/26/22 Potential to Achieve Goals: Fair  OT Frequency: Min 2X/week       AM-PAC OT "6 Clicks" Daily Activity     Outcome Measure Help from another person eating meals?: A Little Help from another person taking care of personal grooming?: A Little Help from another person toileting, which includes using toliet, bedpan, or urinal?: A Lot Help from another person bathing (including  washing, rinsing, drying)?: A Little Help from another person to  put on and taking off regular upper body clothing?: A Little Help from another person to put on and taking off regular lower body clothing?: A Lot 6 Click Score: 16   End of Session Equipment Utilized During Treatment: Gait belt;Rolling walker (2 wheels) Nurse Communication: Mobility status;Other (comment) (OT recs)  Activity Tolerance: Patient tolerated treatment well;Patient limited by pain Patient left: in bed;with call bell/phone within reach;with bed alarm set;Other (comment) (RT in room)  OT Visit Diagnosis: Repeated falls (R29.6);Muscle weakness (generalized) (M62.81);History of falling (Z91.81);Unsteadiness on feet (R26.81);Pain Pain - Right/Left:  (Low back) Pain - part of body:  (Low back pain/chronic)                Time: 4045-9136 OT Time Calculation (min): 20 min Charges:  OT General Charges $OT Visit: 1 Visit OT Evaluation $OT Eval Low Complexity: 1 Low  Jaelyn Cloninger Beth Dixon, OT 01/12/2022, 8:52 AM

## 2022-01-12 NOTE — Discharge Instructions (Addendum)
Information on my medicine - ELIQUIS (apixaban)  This medication education was reviewed with me or my healthcare representative as part of my discharge preparation.   Why was Eliquis prescribed for you? Eliquis was prescribed to treat blood clots that may have been found in the veins of your legs (deep vein thrombosis) or in your lungs (pulmonary embolism) and to reduce the risk of them occurring again.  What do You need to know about Eliquis ? The starting dose is 10 mg (two 5 mg tablets) taken TWICE daily for the FIRST SEVEN (7) DAYS, then on  01/18/22  the dose is reduced to ONE 5 mg tablet taken TWICE daily.  Eliquis may be taken with or without food.   Try to take the dose about the same time in the morning and in the evening. If you have difficulty swallowing the tablet whole please discuss with your pharmacist how to take the medication safely.  Take Eliquis exactly as prescribed and DO NOT stop taking Eliquis without talking to the doctor who prescribed the medication.  Stopping may increase your risk of developing a new blood clot.  Refill your prescription before you run out.  After discharge, you should have regular check-up appointments with your healthcare provider that is prescribing your Eliquis.    What do you do if you miss a dose? If a dose of ELIQUIS is not taken at the scheduled time, take it as soon as possible on the same day and twice-daily administration should be resumed. The dose should not be doubled to make up for a missed dose.  Important Safety Information A possible side effect of Eliquis is bleeding. You should call your healthcare provider right away if you experience any of the following: Bleeding from an injury or your nose that does not stop. Unusual colored urine (red or dark brown) or unusual colored stools (red or black). Unusual bruising for unknown reasons. A serious fall or if you hit your head (even if there is no bleeding).  Some  medicines may interact with Eliquis and might increase your risk of bleeding or clotting while on Eliquis. To help avoid this, consult your healthcare provider or pharmacist prior to using any new prescription or non-prescription medications, including herbals, vitamins, non-steroidal anti-inflammatory drugs (NSAIDs) and supplements.  This website has more information on Eliquis (apixaban): http://www.eliquis.com/eliquis/home      Stop taking plavix, after you complete treatment with Eliquis (Blood clot usually treated for 3 to 6 months) your Doctor will advised you to resume Plavix.

## 2022-01-12 NOTE — Inpatient Diabetes Management (Signed)
Inpatient Diabetes Program Recommendations  AACE/ADA: New Consensus Statement on Inpatient Glycemic Control (2015)  Target Ranges:  Prepandial:   less than 140 mg/dL      Peak postprandial:   less than 180 mg/dL (1-2 hours)      Critically ill patients:  140 - 180 mg/dL   Lab Results  Component Value Date   GLUCAP 328 (H) 01/12/2022   HGBA1C 13.2 (H) 01/10/2022    Review of Glycemic Control  Latest Reference Range & Units 01/11/22 06:15 01/11/22 10:47 01/11/22 10:58 01/11/22 11:07 01/11/22 16:35 01/11/22 21:14 01/12/22 09:25  Glucose-Capillary 70 - 99 mg/dL 141 (H)  Novolog 6 units 48 (L) 46 (L) 79 151 (H) 332 (H) 328 (H)   Diabetes history: DM 2 Outpatient Diabetes medications: Novolog 5 units tid, Tresiba 26 units qhs Current orders for Inpatient glycemic control:  Semglee 15 units qhs Novolog 0-9 units tid + hs   A1c 13.2% on 9/5  Pt with hypoglycemia yesterday due to 6 units of Novolog given for a glucose of 141 (semglee had not been given yet). Note meal coverage d/c'd and Semglee decreased (first dose of semglee given last night 10 units)  -  Consider increasing Semglee up to 20 units -  Agree with d/c of Novolog meal coverage for now.   Thanks,  Tama Headings RN, MSN, BC-ADM Inpatient Diabetes Coordinator Team Pager [phone] (8a-5p)

## 2022-01-12 NOTE — Progress Notes (Signed)
This encounter was created in error - please disregard. No show for follow up left foot cellulitis/swelling.

## 2022-01-12 NOTE — Progress Notes (Addendum)
PROGRESS NOTE    [name]  DTO:[id number] DOB: 10-20-1935 DOA: 01/10/2022 PCP: Wardell Honour, MD   Brief Narrative: 86 year old with past medical history significant for CVA, COPD with chronic respiratory failure, hypertension, GERD, hyperlipidemia, diabetes type 2, thrombocytopenia who presents complaining of high blood sugar.  Her Blood Sugar saying she was above 600.  She also complaining of left leg foot swelling.  She was started on doxycycline by PCP on 8/30.  Patient admitted with hyperglycemia and bilateral lower extremity cellulitis worse on the left.  He was started on IV insulin drip, she has been transitioned to long-acting insulin.  She episode of hypoglycemia.  She has been confused.  Assessment & Plan:   Principal Problem:   Hyperglycemia due to type 2 diabetes mellitus (HCC) Active Problems:   Cellulitis in diabetic foot (HCC)   Thrombocytopenia (HCC)   Hypertension   COPD mixed type with chronic respiratory failure    Pacemaker   Hyperlipidemia   GERD (gastroesophageal reflux disease)   History of CVA (cerebrovascular accident)   1-Hyperglycemia due to diabetes type 2, uncontrolled in the setting of cellulitis -She was initially treated with insulin drip, she has been transitioned to [facility] and 26 units daily.  Sliding scale insulin.  We will discontinue meal coverage scale 5 units due to hypoglycemic episode this morning. A1c 13-----previously more than 15 3 months ago. CBG increasing. Increase Semglee to 20 unit.   Cellulitis bilateral lower extremity worse on the left Continue with IV ceftriaxone Vancomycin  Redness improving  Chronic DVT involving left femoral vein, left popliteal vein and left peroneal veins: Started on Eliquis. High risk for propagation of blood clot.   Thrombocytopenia: Suspect related to acute infection, recent DVT Resolved.    Hypertension: Continue with Toprol  COPD chronic respiratory failure: Continue with  nebulizer  Pacemaker status post dual-chamber implanted 01/2020  Lipidemia: Continue with a statin  GERD: Continue with Protonix  History of CVA continue  and statin Will hold plavix while on eliquis.   Acute metabolic encephalopathy  Confusion,. In setting of infection.  B 12, 450, TSh,2.2 . RPR. Non reactive Resolved. Patient is alert and conversant today.  She is alert x3, she knows that she is in the hospital, she knows why she is in the hospital.  She said that she wishes to go home, that she has caregivers at home.  Her caregiver help her with her insulin and medications.  She has been living by herself for a long.  Of time.  She understands why she is in the hospital.  She told me I was diagnosed with a blood clot and infection in my legs.  Right ankle brachial index mild right LE arterial diseases. Needs follow up with vascular.    Chronic Hypoxic Resp failure; on 2 L at home.  See wound care documentation below.   Pressure Injury 01/11/22 Sacrum Mid;Medial Stage 1 -  Intact skin with non-blanchable redness of a localized area usually over a bony prominence. (Active)  01/11/22 0226  Location: Sacrum  Location Orientation: Mid;Medial  Staging: Stage 1 -  Intact skin with non-blanchable redness of a localized area usually over a bony prominence.  Wound Description (Comments):   Present on Admission: Yes  Dressing Type Foam - Lift dressing to assess site every shift 01/12/22 0814     Pressure Injury 01/11/22 Ischial tuberosity Left Stage 1 -  Intact skin with non-blanchable redness of a localized area usually over a bony prominence. (Active)  01/11/22 0227  Location: Ischial tuberosity  Location Orientation: Left  Staging: Stage 1 -  Intact skin with non-blanchable redness of a localized area usually over a bony prominence.  Wound Description (Comments):   Present on Admission: Yes  Dressing Type Foam - Lift dressing to assess site every shift 01/12/22 0814     Pressure  Injury 01/11/22 Heel Left;Right Stage 1 -  Intact skin with non-blanchable redness of a localized area usually over a bony prominence. (Active)  01/11/22 0245  Location: Heel  Location Orientation: Left;Right  Staging: Stage 1 -  Intact skin with non-blanchable redness of a localized area usually over a bony prominence.  Wound Description (Comments):   Present on Admission: Yes  Dressing Type Foam - Lift dressing to assess site every shift 01/12/22 0814      Estimated body mass index is 21.79 kg/m as calculated from the following:   Height as of this encounter: 5' 3"  (1.6 m).   Weight as of this encounter: 55.8 kg.   DVT prophylaxis: Eliquis Code Status: dnr Family Communication: care discussed with patient.  Disposition Plan:  Status is: Inpatient Remains inpatient appropriate because: management of cellulitis, dvt    Consultants:  none  Procedures:  Doppler.   Antimicrobials:    Subjective: She is alert and conversant today she is oriented x3.  She wishes to go home.  She refused to go to rehab.  She was able to tell me where she was and why she is in the hospital and the medications that she has received.  He said that she has caregivers at home  Objective: Vitals:   01/12/22 0415 01/12/22 0814 01/12/22 0830 01/12/22 0832  BP:  (!) 118/104  (!) 118/104  Pulse:  81  81  Resp:  16  16  Temp: 97.7 F (36.5 C) 98.1 F (36.7 C)    TempSrc: Oral Oral    SpO2:  95% 97% 97%  Weight:      Height:        Intake/Output Summary (Last 24 hours) at 01/12/2022 1528 Last data filed at 01/12/2022 1300 Gross per 24 hour  Intake 959.74 ml  Output 2300 ml  Net -1340.26 ml    Filed Weights   01/10/22 1305 01/11/22 0150  Weight: 57 kg 55.8 kg    Examination:  General exam: NAD Respiratory system: CTA Cardiovascular system: S 1, S 2 RRR.  Gastrointestinal system: BS present, soft, nt Central nervous system: alert Extremities: symmetric power   Data Reviewed: I  have personally reviewed following labs and imaging studies  CBC: Recent Labs  Lab 01/10/22 1342 01/10/22 1350 01/11/22 1054 01/12/22 0349  WBC 9.2  --  14.5* 7.9  NEUTROABS 6.8  --   --   --   HGB 15.8* 17.0* 14.0 13.8  HCT 49.2* 50.0* 42.1 41.7  MCV 94.8  --  89.4 91.4  PLT 96*  --  289 151    Basic Metabolic Panel: Recent Labs  Lab 01/10/22 1342 01/10/22 1350 01/10/22 2059 01/11/22 0923 01/12/22 0349  NA 125* 122* 135 136 133*  K 5.6* 5.4* 4.2 3.8 4.0  CL 89*  --  94* 98 95*  CO2 25  --  30 29 28   GLUCOSE 720*  --  243* 97 279*  BUN 26*  --  20 16 16   CREATININE 0.68  --  0.58 0.57 0.71  CALCIUM 9.3  --  9.0 9.0 8.8*  MG 1.7  --   --   --   --  GFR: Estimated Creatinine Clearance: 43.3 mL/min (by C-G formula based on SCr of 0.71 mg/dL). Liver Function Tests: Recent Labs  Lab 01/10/22 1342  AST 23  ALT 18  ALKPHOS 79  BILITOT 0.6  PROT 7.5  ALBUMIN 3.6    Recent Labs  Lab 01/10/22 1342  LIPASE 69*    No results for input(s): "AMMONIA" in the last 168 hours. Coagulation Profile: No results for input(s): "INR", "PROTIME" in the last 168 hours. Cardiac Enzymes: No results for input(s): "CKTOTAL", "CKMB", "CKMBINDEX", "TROPONINI" in the last 168 hours. BNP (last 3 results) No results for input(s): "PROBNP" in the last 8760 hours. HbA1C: Recent Labs    01/10/22 2059  HGBA1C 13.2*    CBG: Recent Labs  Lab 01/11/22 1107 01/11/22 1635 01/11/22 2114 01/12/22 0925 01/12/22 1158  GLUCAP 79 151* 332* 328* 210*    Lipid Profile: No results for input(s): "CHOL", "HDL", "LDLCALC", "TRIG", "CHOLHDL", "LDLDIRECT" in the last 72 hours. Thyroid Function Tests: Recent Labs    01/11/22 1054  TSH 2.226    Anemia Panel: Recent Labs    01/11/22 1054  VITAMINB12 450    Sepsis Labs: No results for input(s): "PROCALCITON", "LATICACIDVEN" in the last 168 hours.  No results found for this or any previous visit (from the past 240 hour(s)).        Radiology Studies: VAS [place] LOWER EXTREMITY VENOUS (DVT) (7a-7p)  Result Date: 01/11/2022  Lower Venous DVT Study Patient Name:  JASIYA MARKIE  Date of Exam:   01/11/2022 Medical Rec #: [id number]         Accession #:    [id number] Date of Birth: 1935-09-23         Patient Gender: F Patient Age:   70 years Exam Location:  [facility] Procedure:      VAS [place] LOWER EXTREMITY VENOUS (DVT) Referring Phys: Nicki Reaper [place] --------------------------------------------------------------------------------  Indications: Swelling.  Risk Factors: None identified. Limitations: Bandages and open wound. Comparison Study: No prior studies. Performing Technologist: Oliver Hum RVT  Examination Guidelines: A complete evaluation includes B-mode imaging, spectral Doppler, color Doppler, and power Doppler as needed of all accessible portions of each vessel. Bilateral testing is considered an integral part of a complete examination. Limited examinations for reoccurring indications may be performed as noted. The reflux portion of the exam is performed with the patient in reverse Trendelenburg.  +-----+---------------+---------+-----------+----------+--------------+ RIGHTCompressibilityPhasicitySpontaneityPropertiesThrombus Aging +-----+---------------+---------+-----------+----------+--------------+ CFV  Full           Yes      Yes                                 +-----+---------------+---------+-----------+----------+--------------+   +---------+---------------+---------+-----------+----------+--------------+ LEFT     CompressibilityPhasicitySpontaneityPropertiesThrombus Aging +---------+---------------+---------+-----------+----------+--------------+ CFV      Full           Yes      Yes                                 +---------+---------------+---------+-----------+----------+--------------+ SFJ      Full                                                         +---------+---------------+---------+-----------+----------+--------------+ FV Prox  Partial        No       No                   Chronic        +---------+---------------+---------+-----------+----------+--------------+ FV Mid   Full                                                        +---------+---------------+---------+-----------+----------+--------------+ FV DistalFull                                                        +---------+---------------+---------+-----------+----------+--------------+ PFV      Full                                                        +---------+---------------+---------+-----------+----------+--------------+ POP      Partial        No       No                   Chronic        +---------+---------------+---------+-----------+----------+--------------+ PTV      Full                                                        +---------+---------------+---------+-----------+----------+--------------+ PERO     Partial                                      Chronic        +---------+---------------+---------+-----------+----------+--------------+ Thrombus located in the popliteal vein is noted to only be in the distal segment.   Summary: RIGHT: - No evidence of common femoral vein obstruction.  LEFT: - Findings consistent with chronic deep vein thrombosis involving the left femoral vein, left popliteal vein, and left peroneal veins. - No cystic structure found in the popliteal fossa.  *See table(s) above for measurements and observations. Electronically signed by Harold Barban MD on 01/11/2022 at 10:29:57 PM.    Final    VAS [place] ABI WITH/WO TBI  Result Date: 01/11/2022  LOWER EXTREMITY DOPPLER STUDY Patient Name:  MONIKE BRAGDON  Date of Exam:   01/11/2022 Medical Rec #: [id number]         Accession #:    [id number] Date of Birth: Feb 20, 1936         Patient Gender: F Patient Age:   23 years Exam Location:  [facility] Procedure:       VAS [place] ABI WITH/WO TBI Referring Phys: Orma Flaming --------------------------------------------------------------------------------  Indications: Ulceration. High Risk Factors: Hypertension, hyperlipidemia, Diabetes.  Limitations: Today's exam was limited due to an open wound and bandages. Comparison Study: No prior studies. Performing Technologist: Carlos Levering RVT  Examination Guidelines: A complete evaluation  includes at minimum, Doppler waveform signals and systolic blood pressure reading at the level of bilateral brachial, anterior tibial, and posterior tibial arteries, when vessel segments are accessible. Bilateral testing is considered an integral part of a complete examination. Photoelectric Plethysmograph (PPG) waveforms and toe systolic pressure readings are included as required and additional duplex testing as needed. Limited examinations for reoccurring indications may be performed as noted.  ABI Findings: +---------+------------------+-----+----------+--------+ Right    Rt Pressure (mmHg)IndexWaveform  Comment  +---------+------------------+-----+----------+--------+ Brachial 124                    triphasic          +---------+------------------+-----+----------+--------+ PTA      101               0.81 monophasic         +---------+------------------+-----+----------+--------+ DP       108               0.87 monophasic         +---------+------------------+-----+----------+--------+ Great Toe42                0.34                    +---------+------------------+-----+----------+--------+ +---------+------------------+-----+----------+-------+ Left     Lt Pressure (mmHg)IndexWaveform  Comment +---------+------------------+-----+----------+-------+ Brachial 110                    triphasic         +---------+------------------+-----+----------+-------+ PTA      138               1.11 monophasic         +---------+------------------+-----+----------+-------+ DP       134               1.08 monophasic        +---------+------------------+-----+----------+-------+ Great Toe19                0.15                   +---------+------------------+-----+----------+-------+ +-------+-----------+-----------+------------+------------+ ABI/TBIToday's ABIToday's TBIPrevious ABIPrevious TBI +-------+-----------+-----------+------------+------------+ Right  0.87       0.34                                +-------+-----------+-----------+------------+------------+ Left   1.11       0.15                                +-------+-----------+-----------+------------+------------+  Summary: Right: Resting right ankle-brachial index indicates mild right lower extremity arterial disease. The right toe-brachial index is abnormal. Left: Resting left ankle-brachial index is within normal range. The left toe-brachial index is abnormal. Pressures are likely falsely elevated due to medial calcification. *See table(s) above for measurements and observations.  Electronically signed by Harold Barban MD on 01/11/2022 at 10:29:45 PM.    Final         Scheduled Meds:  apixaban  10 mg Oral BID   Followed by   Derrill Memo ON 01/18/2022] apixaban  5 mg Oral BID   arformoterol  15 mcg Nebulization BID   And   umeclidinium bromide  1 puff Inhalation Daily   budesonide  0.25 mg Inhalation BID   busPIRone  5 mg Oral TID   clopidogrel  75 mg Oral Daily   furosemide  20 mg Oral Daily   insulin aspart  0-5 Units Subcutaneous QHS   insulin aspart  0-9 Units Subcutaneous TID WC   insulin glargine-yfgn  15 Units Subcutaneous QHS   metoprolol succinate  50 mg Oral BID   multivitamin with minerals  1 tablet Oral Daily   pantoprazole  40 mg Oral Daily   pravastatin  20 mg Oral Daily   thiamine  100 mg Oral Daily   Continuous Infusions:  cefTRIAXone (ROCEPHIN)  IV 1 g (01/11/22 2112)   vancomycin 750 mg (01/11/22  1946)     LOS: 2 days    Time spent: 35 minutes.     Elmarie Shiley, MD [facility]   If 7PM-7AM, please contact night-coverage [email or web address]  01/12/2022, 3:28 PM

## 2022-01-12 NOTE — Plan of Care (Signed)
  Problem: Education: Goal: Ability to describe self-care measures that may prevent or decrease complications (Diabetes Survival Skills Education) will improve Outcome: Progressing   Problem: Fluid Volume: Goal: Ability to maintain a balanced intake and output will improve Outcome: Progressing   Problem: Nutritional: Goal: Maintenance of adequate nutrition will improve Outcome: Progressing   Problem: Nutritional: Goal: Maintenance of adequate nutrition will improve Outcome: Progressing   Problem: Activity: Goal: Risk for activity intolerance will decrease Outcome: Progressing

## 2022-01-13 ENCOUNTER — Other Ambulatory Visit (HOSPITAL_COMMUNITY): Payer: Self-pay

## 2022-01-13 DIAGNOSIS — E1165 Type 2 diabetes mellitus with hyperglycemia: Secondary | ICD-10-CM | POA: Diagnosis not present

## 2022-01-13 DIAGNOSIS — Z794 Long term (current) use of insulin: Secondary | ICD-10-CM | POA: Diagnosis not present

## 2022-01-13 LAB — GLUCOSE, CAPILLARY
Glucose-Capillary: 174 mg/dL — ABNORMAL HIGH (ref 70–99)
Glucose-Capillary: 249 mg/dL — ABNORMAL HIGH (ref 70–99)

## 2022-01-13 LAB — BASIC METABOLIC PANEL
Anion gap: 9 (ref 5–15)
BUN: 13 mg/dL (ref 8–23)
CO2: 30 mmol/L (ref 22–32)
Calcium: 8.7 mg/dL — ABNORMAL LOW (ref 8.9–10.3)
Chloride: 97 mmol/L — ABNORMAL LOW (ref 98–111)
Creatinine, Ser: 0.49 mg/dL (ref 0.44–1.00)
GFR, Estimated: >60 mL/min (ref >60)
Glucose, Bld: 169 mg/dL — ABNORMAL HIGH (ref 70–99)
Potassium: 3.8 mmol/L (ref 3.5–5.1)
Sodium: 136 mmol/L (ref 135–145)

## 2022-01-13 MED ORDER — APIXABAN 5 MG PO TABS: 5.0000 mg | ORAL_TABLET | Freq: Two times a day (BID) | ORAL | 6 refills | Status: DC

## 2022-01-13 MED ORDER — CEPHALEXIN 500 MG PO CAPS
500.0000 mg | ORAL_CAPSULE | Freq: Four times a day (QID) | ORAL | 0 refills | Status: AC
  Filled 2022-01-13: qty 20, 5d supply, fill #0

## 2022-01-13 MED ORDER — IPRATROPIUM-ALBUTEROL 0.5-2.5 (3) MG/3ML IN SOLN
RESPIRATORY_TRACT | 0 refills | Status: AC
  Filled 2022-01-13: qty 90, 10d supply, fill #0

## 2022-01-13 MED ORDER — APIXABAN (ELIQUIS) VTE STARTER PACK (10MG AND 5MG)
ORAL_TABLET | ORAL | 0 refills | Status: DC
  Filled 2022-01-13: qty 74, 30d supply, fill #0

## 2022-01-13 NOTE — Care Management Important Message (Signed)
Important Message  Patient Details  Name: [name] MRN: [id number] Date of Birth: 09/04/1935   Medicare Important Message Given:  Yes     [name] 01/13/2022, 2:54 PM

## 2022-01-13 NOTE — TOC Transition Note (Signed)
Transition of Care [facility]) - CM/SW Discharge Note   Patient Details  Name: [name] MRN: [id number] Date of Birth: December 17, 1935  [facility]) CM/SW Contact:  [name] Mayo, RN Phone Number: 01/13/2022, 3:08 PM   Clinical Narrative:    Patient is for dc today, NCM contacted her aide [name] , she states she will be here in 45 mins or so.  NCM contacted [name] with [employer] for the portable oxygen for patient to go home with, patient states her oxygen works and the aide states the oxygen does not work.   [employer] wlll also have someone to go by the patient's home to make sure her oxygen is working.  Patient refused HHRN, HHPT, HHOT. She does not want  a lot of peiople going in and out of her home per patient.     Final next level of care: Other (comment) (TBD) Barriers to Discharge: Continued Medical Work up   Patient Goals and CMS Choice        Discharge Placement                       Discharge Plan and Services                                     Social Determinants of Health (SDOH) Interventions Transportation Interventions: Intervention Not Indicated (Pt friend, [name], states pt has caregivers that transport her to appointments and get her groceries)   Readmission Risk Interventions     No data to display

## 2022-01-13 NOTE — TOC Progression Note (Addendum)
Transition of Care [facility]) - Progression Note    Patient Details  Name: [name] MRN: [id number] Date of Birth: 1935/09/08  Transition of Care [facility]) CM/SW Contact  Zenon Mayo, RN Phone Number: 01/13/2022, 11:18 AM  Clinical Narrative:    Patient  refusing SNF, she wants to go home, she was active with [facility] for [place], [place], [place], this NCM offered choice to see if she still would like to contiue with [facility] she said that she does not want any HH services, she states she has a private Nurse/aide already and she does not want  that many people coming out to her home.  This NCM informed her that we could just do [facility] to come out to check on her, she states no she does not want that.  She has home oxygen 2-3 liters , she has walker/cane.  She states [name], the aide takes her to her MD apts, she states she has no issues with medications, she has a scale but she does not weigh her self daily, she has a bp cuff and she checks her bp daily, she eats a no salt diet.  NCM called [name] at [phone], she did not answer and vm was full and could not leave a message.  NCM will try to call [name] back she works with patient from 9 to 78,.  NCM called [name] she answered she gave me Courtney's number , who is also an aide [phone].  NCM spoke with Loma Sousa she works with patient from 72 to 4pm.  Loma Sousa states the patient's oxygen at home does not work, she think the oxygen company is Risk analyst.  Loma Sousa will transport patient home when she is ready to be discharged.  NCM called Zach with [employer] he is looking int the oxygen situation.     Expected Discharge Plan:  (TBD) Barriers to Discharge: Continued Medical Work up  Expected Discharge Plan and Services Expected Discharge Plan:  (TBD)       Living arrangements for the past 2 months: Apartment                                       Social Determinants of Health (SDOH) Interventions Transportation Interventions: Intervention Not Indicated  (Pt friend, Gerald Stabs, states pt has caregivers that transport her to appointments and get her groceries)  Readmission Risk Interventions     No data to display

## 2022-01-13 NOTE — Inpatient Diabetes Management (Addendum)
Inpatient Diabetes Program Recommendations  AACE/ADA: New Consensus Statement on Inpatient Glycemic Control (2015)  Target Ranges:  Prepandial:   less than 140 mg/dL      Peak postprandial:   less than 180 mg/dL (1-2 hours)      Critically ill patients:  140 - 180 mg/dL   Lab Results  Component Value Date   GLUCAP 249 (H) 01/13/2022   HGBA1C 13.2 (H) 01/10/2022    Review of Glycemic Control  Latest Reference Range & Units 01/12/22 09:25 01/12/22 11:58 01/12/22 16:17 01/12/22 20:40 01/13/22 05:52 01/13/22 11:02  Glucose-Capillary 70 - 99 mg/dL 328 (H) 210 (H) 239 (H) 373 (H) 174 (H) 249 (H)   Diabetes history: DM 2 Outpatient Diabetes medications: Novolog 5 units tid, Tresiba 26 units qhs Current orders for Inpatient glycemic control:  Semglee 15 units qhs Novolog 0-9 units tid + hs   A1c 13.2% on 9/5  Will need home monitoring of medications  Thanks,  Tama Headings RN, MSN, BC-ADM Inpatient Diabetes Coordinator Team Pager [phone] (8a-5p)

## 2022-01-13 NOTE — Discharge Summary (Signed)
Physician Discharge Summary   Patient: [name] MRN: [id number] DOB: 1935-08-26  Admit date:     01/10/2022  Discharge date: 01/13/22  Discharge Physician: Elmarie Shiley   PCP: Wardell Honour, MD   Recommendations at discharge:    Needs Tx for DVT for 3 to 6 months. Plavix discontinue while patient is on eliquis to decrease risk for bleeding.  Needs referral to vascular for evaluation of PAD.   Discharge Diagnoses: Principal Problem:   Hyperglycemia due to type 2 diabetes mellitus (HCC) Active Problems:   Cellulitis in diabetic foot (HCC)   Thrombocytopenia (HCC)   Hypertension   COPD mixed type with chronic respiratory failure    Pacemaker   Hyperlipidemia   GERD (gastroesophageal reflux disease)   History of CVA (cerebrovascular accident)  Resolved Problems:   * No resolved hospital problems. *  Hospital Course: 86 year old with past medical history significant for CVA, COPD with chronic respiratory failure, hypertension, GERD, hyperlipidemia, diabetes type 2, thrombocytopenia who presents complaining of high blood sugar.  Her Blood Sugar saying she was above 600.  She also complaining of left leg foot swelling.  She was started on doxycycline by PCP on 8/30.   Patient admitted with hyperglycemia and bilateral lower extremity cellulitis worse on the left.  He was started on IV insulin drip, she has been transitioned to long-acting insulin.  She had episode of hypoglycemia.  She was initially  confused. Her encephalopathy has resolved. Her Cellulitis has improved. She was diagnosed with DVT femoral LE , started on eliquis. Patient stable for discharge.   Assessment and Plan:   1-Hyperglycemia due to diabetes type 2, uncontrolled in the setting of cellulitis -She was initially treated with insulin drip, she has been transitioned to [facility] and 26 units daily.  Sliding scale insulin.  We will discontinue meal coverage scale 5 units due to hypoglycemic episode this  morning. A1c 13-----previously more than 15 3 months ago. CBG increasing. Increase Semglee to 20 unit.   Resume home regimen.   Cellulitis bilateral lower extremity worse on the left Continue with IV ceftriaxone Vancomycin  Redness improving Discharge on Keflex.   Chronic DVT involving left femoral vein, left popliteal vein and left peroneal veins: Started on Eliquis. High risk for propagation of blood clot.    Thrombocytopenia: Suspect related to acute infection, recent DVT Resolved.      Hypertension: Continue with Toprol   COPD chronic respiratory failure: Continue with nebulizer   Pacemaker status post dual-chamber implanted 01/2020   Lipidemia: Continue with a statin   GERD: Continue with Protonix   History of CVA continue  and statin Will hold plavix while on eliquis.    Acute metabolic encephalopathy  Confusion,. In setting of infection.  B 12, 450, TSH,2.2 . RPR. Non reactive Resolved. Patient is alert and conversant. She is alert x 3, she knows that she is in the hospital, she knows why she is in the hospital.  She said that she wishes to go home, that she has caregivers at home.  Her caregiver help her with her insulin and medications.  She has been living by herself for a long. She understands why she is in the hospital.  She told me I was diagnosed with a blood clot and infection in my legs.   Right ankle brachial index mild right LE arterial diseases. Needs follow up with vascular.      Chronic Hypoxic Resp failure; on 2 L at home.  See wound care  documentation below.        Pressure Injury 01/11/22 Sacrum Mid;Medial Stage 1 -  Intact skin with non-blanchable redness of a localized area usually over a bony prominence. (Active)  01/11/22 0226  Location: Sacrum  Location Orientation: Mid;Medial  Staging: Stage 1 -  Intact skin with non-blanchable redness of a localized area usually over a bony prominence.  Wound Description (Comments):   Present on  Admission: Yes  Dressing Type Foam - Lift dressing to assess site every shift 01/12/22 0814     Pressure Injury 01/11/22 Ischial tuberosity Left Stage 1 -  Intact skin with non-blanchable redness of a localized area usually over a bony prominence. (Active)  01/11/22 0227  Location: Ischial tuberosity  Location Orientation: Left  Staging: Stage 1 -  Intact skin with non-blanchable redness of a localized area usually over a bony prominence.  Wound Description (Comments):   Present on Admission: Yes  Dressing Type Foam - Lift dressing to assess site every shift 01/12/22 0814     Pressure Injury 01/11/22 Heel Left;Right Stage 1 -  Intact skin with non-blanchable redness of a localized area usually over a bony prominence. (Active)  01/11/22 0245  Location: Heel  Location Orientation: Left;Right  Staging: Stage 1 -  Intact skin with non-blanchable redness of a localized area usually over a bony prominence.  Wound Description (Comments):   Present on Admission: Yes  Dressing Type Foam - Lift dressing to assess site every shift 01/12/22 0814          Estimated body mass index is 21.79 kg/m as calculated from the following:   Height as of this encounter: 5' 3"  (1.6 m).   Weight as of this encounter: 55.8 kg.       Pain control - [employer] Controlled Substance Reporting System database was reviewed. and patient was instructed, not to drive, operate heavy machinery, perform activities at heights, swimming or participation in water activities or provide baby-sitting services while on Pain, Sleep and Anxiety Medications; until their outpatient Physician has advised to do so again. Also recommended to not to take more than prescribed Pain, Sleep and Anxiety Medications.  Consultants: none Procedures performed: Doppler Disposition: Home Diet recommendation:  Discharge Diet Orders (From admission, onward)     Start     Ordered   01/13/22 0000  Diet - low sodium heart healthy         01/13/22 1359           Carb modified diet DISCHARGE MEDICATION: Allergies as of 01/13/2022       Reactions   Chlordiazepoxide-clidinium Other (See Comments)   Sleepy,weak   Biaxin [clarithromycin] Other (See Comments)   Foul taste, abd pain, diarrhea   Tape Other (See Comments)   SKIN IS VERY THIN AND WILL TEAR AND BRUISE EASILY!!   Ultram [tramadol] Other (See Comments)   Caused tremors   Codeine Other (See Comments)   Upset the stomach        Medication List     STOP taking these medications    aspirin EC 81 MG tablet   clopidogrel 75 MG tablet Commonly known as: PLAVIX   doxycycline 100 MG tablet Commonly known as: VIBRA-TABS   mirabegron ER 50 MG Tb24 tablet Commonly known as: MYRBETRIQ       TAKE these medications    acetaminophen 325 MG tablet Commonly known as: TYLENOL Take 2 tablets (650 mg total) by mouth every 6 (six) hours as needed for mild pain (or  Fever >/= 101).   albuterol 108 (90 Base) MCG/ACT inhaler Commonly known as: VENTOLIN HFA Inhale 1-2 puffs into the lungs every 6 (six) hours as needed for wheezing or shortness of breath.   ALPRAZolam 0.5 MG tablet Commonly known as: XANAX Take 0.25 mg by mouth daily as needed for anxiety.   Apixaban Starter Pack (57m and 5642m Commonly known as: ELIQUIS STARTER PACK Take as directed on package: start with two-42m52mablets twice daily for 7 days. On day 8, switch to one-42mg50mblet twice daily.   apixaban 5 MG Tabs tablet Commonly known as: ELIQUIS Take 1 tablet (5 mg total) by mouth 2 (two) times daily. Start taking on: February 13, 2022   busPIRone 5 MG tablet Commonly known as: BUSPAR Take 5 mg by mouth 3 (three) times daily.   cephALEXin 500 MG capsule Commonly known as: KEFLEX Take 1 capsule (500 mg total) by mouth 4 (four) times daily for 5 days.   feeding supplement Liqd Take 237 mLs by mouth 2 (two) times daily between meals.   fluorometholone 0.1 % ophthalmic  suspension Commonly known as: FML Place 1 drop into both eyes 3 (three) times daily.   fluticasone 110 MCG/ACT inhaler Commonly known as: FLOVENT HFA Inhale 1 puff into the lungs 2 (two) times daily.   furosemide 20 MG tablet Commonly known as: LASIX Take 20 mg by mouth daily.   ipratropium-albuterol 0.5-2.5 (3) MG/3ML Soln Commonly known as: DUONEB 1 vial in neb every 8 hours and as needed   levalbuterol 45 MCG/ACT inhaler Commonly known as: XOPENEX HFA Inhale 1-2 puffs into the lungs every 6 (six) hours as needed for wheezing.   metoprolol succinate 50 MG 24 hr tablet Commonly known as: TOPROL-XL TAKE ONE TABLET TWICE A DAY WITH OR IMMEDIATELY FOLLOWING A MEAL. What changed: See the new instructions.   multivitamin with minerals Tabs tablet Take 1 tablet by mouth daily.   NovoLOG FlexPen 100 UNIT/ML FlexPen Generic drug: insulin aspart Inject 5 Units into the skin 3 (three) times daily with meals.   pantoprazole 40 MG tablet Commonly known as: Protonix Take 1 tablet (40 mg total) by mouth daily.   pravastatin 20 MG tablet Commonly known as: PRAVACHOL Take 1 tablet (20 mg total) by mouth daily.   Stiolto Respimat 2.5-2.5 MCG/ACT Aers Generic drug: Tiotropium Bromide-Olodaterol Inhale 2 puffs into the lungs daily.   thiamine 100 MG tablet Commonly known as: VITAMIN B1 Take 1 tablet (100 mg total) by mouth daily.   TresTyler AasxTouch 200 UNIT/ML FlexTouch Pen Generic drug: insulin degludec Inject 26 Units into the skin at bedtime.               Discharge Care Instructions  (From admission, onward)           Start     Ordered   01/13/22 0000  Discharge wound care:       Comments: See above   01/13/22 1359            Discharge Exam: Filed Weights   01/10/22 1305 01/11/22 0150 01/13/22 0514  Weight: 57 kg 55.8 kg 55.2 kg   General; NAD  Condition at discharge: stable  The results of significant diagnostics from this hospitalization  (including imaging, microbiology, ancillary and laboratory) are listed below for reference.   Imaging Studies: VAS US L[place]ER EXTREMITY VENOUS (DVT) (7a-7p)  Result Date: 01/11/2022  Lower Venous DVT Study Patient Name:  SHIRVERENIS NICOSIAte of Exam:   01/11/2022  Medical Rec #: [id number]         Accession #:    [id number] Date of Birth: [birth year]         Patient Gender: F Patient Age:   86 years Exam Location:  [facility] Procedure:      VAS [place] LOWER EXTREMITY VENOUS (DVT) Referring Phys: Nicki Reaper [place] --------------------------------------------------------------------------------  Indications: Swelling.  Risk Factors: None identified. Limitations: Bandages and open wound. Comparison Study: No prior studies. Performing Technologist: Oliver Hum RVT  Examination Guidelines: A complete evaluation includes B-mode imaging, spectral Doppler, color Doppler, and power Doppler as needed of all accessible portions of each vessel. Bilateral testing is considered an integral part of a complete examination. Limited examinations for reoccurring indications may be performed as noted. The reflux portion of the exam is performed with the patient in reverse Trendelenburg.  +-----+---------------+---------+-----------+----------+--------------+ RIGHTCompressibilityPhasicitySpontaneityPropertiesThrombus Aging +-----+---------------+---------+-----------+----------+--------------+ CFV  Full           Yes      Yes                                 +-----+---------------+---------+-----------+----------+--------------+   +---------+---------------+---------+-----------+----------+--------------+ LEFT     CompressibilityPhasicitySpontaneityPropertiesThrombus Aging +---------+---------------+---------+-----------+----------+--------------+ CFV      Full           Yes      Yes                                 +---------+---------------+---------+-----------+----------+--------------+ SFJ      Full                                                         +---------+---------------+---------+-----------+----------+--------------+ FV Prox  Partial        No       No                   Chronic        +---------+---------------+---------+-----------+----------+--------------+ FV Mid   Full                                                        +---------+---------------+---------+-----------+----------+--------------+ FV DistalFull                                                        +---------+---------------+---------+-----------+----------+--------------+ PFV      Full                                                        +---------+---------------+---------+-----------+----------+--------------+ POP      Partial        No       No  Chronic        +---------+---------------+---------+-----------+----------+--------------+ PTV      Full                                                        +---------+---------------+---------+-----------+----------+--------------+ PERO     Partial                                      Chronic        +---------+---------------+---------+-----------+----------+--------------+ Thrombus located in the popliteal vein is noted to only be in the distal segment.   Summary: RIGHT: - No evidence of common femoral vein obstruction.  LEFT: - Findings consistent with chronic deep vein thrombosis involving the left femoral vein, left popliteal vein, and left peroneal veins. - No cystic structure found in the popliteal fossa.  *See table(s) above for measurements and observations. Electronically signed by Harold Barban MD on 01/11/2022 at 10:29:57 PM.    Final    VAS [place] ABI WITH/WO TBI  Result Date: 01/11/2022  LOWER EXTREMITY DOPPLER STUDY Patient Name:  RAECHAL RABEN  Date of Exam:   01/11/2022 Medical Rec #: [id number]         Accession #:    [id number] Date of Birth: 04-03-1936         Patient Gender: F Patient Age:    61 years Exam Location:  [facility] Procedure:      VAS [place] ABI WITH/WO TBI Referring Phys: Orma Flaming --------------------------------------------------------------------------------  Indications: Ulceration. High Risk Factors: Hypertension, hyperlipidemia, Diabetes.  Limitations: Today's exam was limited due to an open wound and bandages. Comparison Study: No prior studies. Performing Technologist: Carlos Levering RVT  Examination Guidelines: A complete evaluation includes at minimum, Doppler waveform signals and systolic blood pressure reading at the level of bilateral brachial, anterior tibial, and posterior tibial arteries, when vessel segments are accessible. Bilateral testing is considered an integral part of a complete examination. Photoelectric Plethysmograph (PPG) waveforms and toe systolic pressure readings are included as required and additional duplex testing as needed. Limited examinations for reoccurring indications may be performed as noted.  ABI Findings: +---------+------------------+-----+----------+--------+ Right    Rt Pressure (mmHg)IndexWaveform  Comment  +---------+------------------+-----+----------+--------+ Brachial 124                    triphasic          +---------+------------------+-----+----------+--------+ PTA      101               0.81 monophasic         +---------+------------------+-----+----------+--------+ DP       108               0.87 monophasic         +---------+------------------+-----+----------+--------+ Great Toe42                0.34                    +---------+------------------+-----+----------+--------+ +---------+------------------+-----+----------+-------+ Left     Lt Pressure (mmHg)IndexWaveform  Comment +---------+------------------+-----+----------+-------+ Brachial 110                    triphasic         +---------+------------------+-----+----------+-------+ PTA  138               1.11  monophasic        +---------+------------------+-----+----------+-------+ DP       134               1.08 monophasic        +---------+------------------+-----+----------+-------+ Great Toe19                0.15                   +---------+------------------+-----+----------+-------+ +-------+-----------+-----------+------------+------------+ ABI/TBIToday's ABIToday's TBIPrevious ABIPrevious TBI +-------+-----------+-----------+------------+------------+ Right  0.87       0.34                                +-------+-----------+-----------+------------+------------+ Left   1.11       0.15                                +-------+-----------+-----------+------------+------------+  Summary: Right: Resting right ankle-brachial index indicates mild right lower extremity arterial disease. The right toe-brachial index is abnormal. Left: Resting left ankle-brachial index is within normal range. The left toe-brachial index is abnormal. Pressures are likely falsely elevated due to medial calcification. *See table(s) above for measurements and observations.  Electronically signed by Harold Barban MD on 01/11/2022 at 10:29:45 PM.    Final    DG Foot Complete Left  Result Date: 01/10/2022 CLINICAL DATA:  Left foot redness and swelling. Pain. Recently treated for cellulitis by PCP. EXAM: LEFT FOOT - COMPLETE 3+ VIEW COMPARISON:  Left ankle radiographs 12/28/2021 FINDINGS: Minimal lateral great toe metatarsophalangeal joint space narrowing and peripheral osteophytosis. No acute fracture or dislocation. Mild-to-moderate midfoot and hindfoot greater than forefoot soft tissue swelling. IMPRESSION: Soft tissue swelling.  No acute bone abnormality. Electronically Signed   By: Yvonne Kendall M.D.   On: 01/10/2022 14:09   DG Ankle Complete Left  Result Date: 12/29/2021 CLINICAL DATA:  Ankle pain and swelling EXAM: LEFT ANKLE COMPLETE - 3+ VIEW COMPARISON:  None Available. FINDINGS: No fracture or  malalignment. Bandage artifact over the lateral and posterior aspect of the ankle. Mortise is symmetric. Generalized soft tissue edema. IMPRESSION: No acute osseous abnormality.  Soft tissue swelling Electronically Signed   By: Donavan Foil M.D.   On: 12/29/2021 23:40    Microbiology: Results for orders placed or performed during the hospital encounter of 12/08/21  Blood culture (routine x 2)     Status: None   Collection Time: 12/08/21  5:43 PM   Specimen: BLOOD LEFT ARM  Result Value Ref Range Status   Specimen Description BLOOD LEFT ARM  Final   Special Requests   Final    BOTTLES DRAWN AEROBIC AND ANAEROBIC Blood Culture results may not be optimal due to an excessive volume of blood received in culture bottles   Culture   Final    NO GROWTH 5 DAYS Performed at [facility], [place] [address], [place], [place] [address]    Report Status 12/13/2021 FINAL  Final  Blood culture (routine x 2)     Status: None   Collection Time: 12/08/21  8:15 PM   Specimen: BLOOD  Result Value Ref Range Status   Specimen Description BLOOD SITE NOT SPECIFIED  Final   Special Requests   Final    BOTTLES DRAWN AEROBIC AND ANAEROBIC Blood Culture  adequate volume   Culture   Final    NO GROWTH 5 DAYS Performed at [facility], [place] [address], [place], [place] [address]    Report Status 12/13/2021 FINAL  Final  Urine Culture     Status: Abnormal   Collection Time: 12/08/21  9:43 PM   Specimen: Urine, Clean Catch  Result Value Ref Range Status   Specimen Description URINE, CLEAN CATCH  Final   Special Requests   Final    NONE Performed at [facility], [place] [address] [place], [place] [address]    Culture MULTIPLE SPECIES PRESENT, SUGGEST RECOLLECTION (A)  Final   Report Status 12/10/2021 FINAL  Final   *Note: Due to a large number of results and/or encounters for the requested time period, some results have not been displayed. A complete set of results can be found in Results Review.     Labs: CBC: Recent Labs  Lab 01/10/22 1342 01/10/22 1350 01/11/22 1054 01/12/22 0349  WBC 9.2  --  14.5* 7.9  NEUTROABS 6.8  --   --   --   HGB 15.8* 17.0* 14.0 13.8  HCT 49.2* 50.0* 42.1 41.7  MCV 94.8  --  89.4 91.4  PLT 96*  --  289 850   Basic Metabolic Panel: Recent Labs  Lab 01/10/22 1342 01/10/22 1350 01/10/22 2059 01/11/22 0923 01/12/22 0349 01/13/22 0534  NA 125* 122* 135 136 133* 136  K 5.6* 5.4* 4.2 3.8 4.0 3.8  CL 89*  --  94* 98 95* 97*  CO2 25  --  30 29 28 30   GLUCOSE 720*  --  243* 97 279* 169*  BUN 26*  --  20 16 16 13   CREATININE 0.68  --  0.58 0.57 0.71 0.49  CALCIUM 9.3  --  9.0 9.0 8.8* 8.7*  MG 1.7  --   --   --   --   --    Liver Function Tests: Recent Labs  Lab 01/10/22 1342  AST 23  ALT 18  ALKPHOS 79  BILITOT 0.6  PROT 7.5  ALBUMIN 3.6   CBG: Recent Labs  Lab 01/12/22 1158 01/12/22 1617 01/12/22 2040 01/13/22 0552 01/13/22 1102  GLUCAP 210* 239* 373* 174* 249*    Discharge time spent: greater than 30 minutes.  Signed: Elmarie Shiley, MD [facility] 01/13/2022

## 2022-01-13 NOTE — Progress Notes (Signed)
   01/13/22 0951  Mobility  Activity Ambulated with assistance in hallway  Level of Assistance Standby assist, set-up cues, supervision of patient - no hands on  Assistive Device Front wheel walker  Distance Ambulated (ft) 15 ft  Activity Response Tolerated well  $Mobility charge 1 Mobility   Mobility Specialist Progress Note  Pt was in bed and agreeable. Mobility cut short d/t c/o back pain. Returned back to bed w/ all needs met and chair alarm on.   Lucious Groves Mobility Specialist

## 2022-01-13 NOTE — Consult Note (Addendum)
   [facility] Inpatient Consult   01/13/2022  [name] April 16, 1936 [id number]  [place] [facility] [ACO] Patient: [name]  Primary Care Provider: Wardell Honour, MD, [facility]  Patient was assessed for [place] Management for community services. Patient was previously active with [place] Coordinator.  Met with patient at bedside regarding being restarted with [facility] services. Patient alert oriented and speaking of her cats and liking her aide, [name].  Patient speak about starting on Eliquis for her left leg DVT. Patient states she is to call her PCP when she gets back home and make an appointment.  Gave the patient an appointment reminder card and a 24 hour nurse advise line magnet. Explained post hospital follow up needs.   Patient states how she likes to make her decisions about people and she gives people a second chance if she likes them. She states, "[name] and I get along well, especially because she likes my cats and my cats likes her." Spoke with inpatient TOC team about [facility] follow up in community.  11:30 am return to speak with patient in regards to home health coming back after with review of inpatient TOC that she doesn't want hoe health. Reminded to her that the nurse will reach out from the PCP office to support of diabetes and DVT. She agrees.  Plan:  Will refer patient to post hospital [facility] RN for Care Coordination.  Of note, [facility] Care Management services does not replace or interfere with any services that are arranged by inpatient [facility] care management team.   For additional questions or referrals please contact:  Natividad Brood, RN BSN [facility] Liaison  [phone] business mobile phone Toll free office [phone]  Fax number: [phone] [email or web address]

## 2022-01-13 NOTE — Progress Notes (Signed)
PT Cancellation Note  Patient Details Name: [name] MRN: [id number] DOB: 20-Sep-1935   Cancelled Treatment:    Reason Eval/Treat Not Completed: Patient declined, no reason specified, pt declining all mobility despite encouragement and education on benefits. Will check back as schedule allows to continue with PT POC.  Audry Riles. PTA Acute Rehabilitation Services Office: [place] 01/13/2022, 2:14 PM

## 2022-01-15 NOTE — Progress Notes (Deleted)
Patient ID: [name], female    DOB: Oct 15, 1935, 86 y.o.   MRN: [id number]  HPI Female former smoker followed for COPD, chronic hypoxic respiratory failure, complicated by chronic rhinitis, GERD, DM, glaucoma 2012- Walk test Patient Saturations on Room Air while Ambulating = 84% Patient Saturations on 2.5 Liters of oxygen while Ambulating = 92%  PFT: 01/16/2012 severe obstructive airways disease with insignificant response to bronchodilator, air trapping, diffusion moderately reduced. FEV1 0.82/45%, FEV1/FEC 0.46. Emphysema pattern on the loop. TLC 100%, RV 148%, DLCO 47%. ---------------------------------------------------------------------     02/07/21- 86 year old female former smoker followed for COPD, chronic hypoxic respiratory failure, complicated by chronic rhinitis, GERD, DM2,  back pain/scoliosis, HTN, Heart Block/ Pacemaker, Sjogren's, Thrombocytopenia,  O2 3-3.5 L/ [employer] Arrival sat 93% on portable Covid vax- 1 Moderna, 1 J&J                                      Here with an aide today Flu vax- -albuterol HFA, Trelegy 100 neb Duoneb, Astelin,  ------8 mo f/u for COPD. States she needs a flu vaccine.  Intermittent pleuritic pains under R lateral ribs x several weeks. No trauma or rash. She blames her scoliosis.  Little cough or wheeze now.  01/17/22- 86 year old female former smoker followed for COPD, chronic hypoxic respiratory failure, complicated by chronic rhinitis, GERD, DM2,  back pain/scoliosis, HTN, Heart Block/ Pacemaker, Sjogren's, Thrombocytopenia,  O2 3-3.5 L/ [employer] Arrival sat 93% on portable -Ventolin hfa, Flovent 110, neb Duoneb, Xopenex hfa, Stiolto, Covid vax- 1 [place], 1 J&J    Hosp 9/5- hyperglycemia  CXR 1V 12/08/21- IMPRESSION: 1. No active disease.   ROS-see HPI  + = positive Constitutional:   No-   weight loss, night sweats, fevers, chills, fatigue, lassitude. HEENT:   No-  headaches, difficulty swallowing, tooth/dental problems, sore throat,        No-  sneezing, itching, ear ache, nasal congestion, post nasal drip,  CV:+chest pain, orthopnea, PND, swelling in lower extremities, anasarca,                                                   Dizziness,+ palpitations Resp: +shortness of breath with exertion or at rest.               productive cough,   non-productive cough,  No- coughing up of blood.                 change in color of mucus.   wheezing.   Skin: No-   rash or lesions. GI:  +heartburn, indigestion, No-abdominal pain, nausea, vomiting,  GU: . MS:  No-   joint pain or swelling.   + back pain Neuro-     nothing unusual Psych:  No- change in mood or affect. +depression or anxiety.  + memory loss.    Objective:  OBJ- Physical Exam    Arrival O2 sat 93% on POC General- Alert, Oriented, Affect-appropriate, Distress- none acute,  Skin- rash-none, lesions- none, excoriation- none  Lymphadenopathy- none Head- atraumatic            Eyes- Gross vision intact, PERRLA, conjunctivae and secretions clear            Ears- Hearing, canals-normal  Nose- Clear, no-Septal dev, mucus, polyps, erosion, perforation             Throat- Mallampati IV , mucosa -no thrush , drainage- none, tonsils- atrophic,                                      stridor-none,  + missing teeth Neck- flexible , trachea midline, no stridor , thyroid nl, carotid no bruit Chest - symmetrical excursion , unlabored           Heart/CV- RRR , no murmur , no gallop  , no rub, nl s1 s2                           - JVD- none , edema- none, stasis changes- none, varices- none           Lung-  + clear, unlabored, wheeze-none, cough- none, dullness-none, rub- none           Chest wall- +L pacemaker Abd-  Br/ Gen/ Rectal- Not done, not indicated Extrem- cyanosis- none, clubbing, none, atrophy- none, strength- , + wheelchair Neuro- grossly intact to observation

## 2022-01-16 ENCOUNTER — Ambulatory Visit: Payer: Self-pay

## 2022-01-16 ENCOUNTER — Telehealth: Payer: Self-pay | Admitting: *Deleted

## 2022-01-16 NOTE — Patient Instructions (Signed)
Visit Information  Thank you for taking time to visit with me today. Please don't hesitate to contact me if I can be of assistance to you.   Following are the goals we discussed today:   Goals Addressed             This Visit's Progress    Care Coordination Activities       Care Coordination Interventions: SDoH screening performed - no acute resource challenges identified. Patient reports her CAP worker assists with transportation to medical appointments as well as grocery shopping Confirmed patient is enrolled with a pharmacy that delivers - patient confirms receipt of newly prescribed medication during recent inpatient stay Noted times during conversation that patient rambled, unable to stay on one thought, repeated self after a few minutes Performed chart review to note patient declined placement and home health services during recent inpatient stay Determined the patient lives alone with no family support, patient has a friend who checks on her by phone that lives in [place]. Patient reports this friend has had police do welfare checks when she cannot get patient on the phone Performed chart review to note patient is active with CAP/DA program - patient endorses a caregiver is in the home daily from 9-1pm Performed chart review to note past concerns with patient cognition- MMSE on 6/26 scored 17 out of 30 which is indicative for severe cognitive impairment Unsuccessful outbound call placed to patients CAP/DA social worker Delma Freeze [phone]; voice message left requesting a return call in order to obtain more information regarding patients plan of care Noted upcomming primary provider appointment scheduled for 9/14 at 1:20 pm. Reminded patient of this appointment; patient confirmed her caregiver will transport Collaboration with Marlowe Sax, NP whom patient is scheduled to see on 9/14 to advise of today's call requesting feedback on patients cognition and ability to remain in the  home SW will continue to follow; collaboration with Barb Merino RN Care Manager advising of interventions and plan         Our next appointment is by telephone on 9/20 at 2:00  Please call the care guide team at [phone] if you need to cancel or reschedule your appointment.   If you are experiencing a Mental Health or [place] or need someone to talk to, please go to [facility] [place] [phone])  The patient verbalized understanding of instructions, educational materials, and care plan provided today and DECLINED offer to receive copy of patient instructions, educational materials, and care plan.   Telephone follow up appointment with care management team member scheduled for:9/20  Daneen Schick, BSW, CDP Social Worker, Certified Dementia Practitioner Care Coordination [phone]

## 2022-01-16 NOTE — Patient Outreach (Signed)
  Care Coordination   Initial Visit Note   01/16/2022 Name: [name] MRN: [id number] DOB: [birth year]  [name] is a 86 y.o. year old female who sees Wardell Honour, MD for primary care. I spoke with  [name] by phone today.  What matters to the patients health and wellness today?  Manage health in the home    Goals Addressed             This Visit's Progress    Care Coordination Activities       Care Coordination Interventions: SDoH screening performed - no acute resource challenges identified. Patient reports her CAP worker assists with transportation to medical appointments as well as grocery shopping Confirmed patient is enrolled with a pharmacy that delivers - patient confirms receipt of newly prescribed medication during recent inpatient stay Noted times during conversation that patient rambled, unable to stay on one thought, repeated self after a few minutes Performed chart review to note patient declined placement and home health services during recent inpatient stay Determined the patient lives alone with no family support, patient has a friend who checks on her by phone that lives in [place]. Patient reports this friend has had police do welfare checks when she cannot get patient on the phone Performed chart review to note patient is active with CAP/DA program - patient endorses a caregiver is in the home daily from 9-1pm Performed chart review to note past concerns with patient cognition- MMSE on 6/26 scored 17 out of 30 which is indicative for severe cognitive impairment Unsuccessful outbound call placed to patients CAP/DA social worker Delma Freeze [phone]; voice message left requesting a return call in order to obtain more information regarding patients plan of care Noted upcomming primary provider appointment scheduled for 9/14 at 1:20 pm. Reminded patient of this appointment; patient confirmed her caregiver will transport Collaboration with  Marlowe Sax, NP whom patient is scheduled to see on 9/14 to advise of today's call requesting feedback on patients cognition and ability to remain in the home SW will continue to follow; collaboration with Barb Merino RN Care Manager advising of interventions and plan         SDOH assessments and interventions completed:  Yes  SDOH Interventions Today    Flowsheet Row Most Recent Value  SDOH Interventions   Food Insecurity Interventions Intervention Not Indicated  Housing Interventions Intervention Not Indicated  Transportation Interventions Intervention Not Indicated  [CAP worker provides transportation]  Utilities Interventions Intervention Not Indicated        Care Coordination Interventions Activated:  Yes  Care Coordination Interventions:  Yes, provided   Follow up plan: Follow up call scheduled for 9/20    Encounter Outcome:  Pt. Visit Completed   Daneen Schick, BSW, CDP Social Worker, Certified Dementia Practitioner Care Coordination [phone]

## 2022-01-16 NOTE — Care Management Important Message (Signed)
Important Message  Patient Details  Name: [name] MRN: [id number] Date of Birth: [birth year]   Medicare Important Message Given:  Yes  IM mailed on 9/08 prior to discharge    [name] 01/16/2022, 8:38 AM

## 2022-01-16 NOTE — Chronic Care Management (AMB) (Signed)
  Care Coordination   Note   01/16/2022 Name: RELDA AGOSTO MRN: [id number] DOB: 1935-09-04  CYLEIGH MASSARO is a 86 y.o. year old female who sees Wardell Honour, MD for primary care. I reached out to [name] by phone today to offer care coordination services.  Ms. Sisler was given information about Care Coordination services today including:   The Care Coordination services include support from the care team which includes your Nurse Coordinator, Clinical Social Worker, or Pharmacist.  The Care Coordination team is here to help remove barriers to the health concerns and goals most important to you. Care Coordination services are voluntary, and the patient may decline or stop services at any time by request to their care team member.   Care Coordination Consent Status: Patient agreed to services and verbal consent obtained.   Follow up plan:  Telephone appointment with care coordination team member scheduled for:  01/16/22  Encounter Outcome:  Pt. Scheduled  [place]  Direct Dial: [phone]

## 2022-01-17 ENCOUNTER — Ambulatory Visit: Payer: Medicare Other | Admitting: Internal Medicine

## 2022-01-17 ENCOUNTER — Ambulatory Visit: Payer: Self-pay

## 2022-01-17 NOTE — Patient Outreach (Signed)
  Care Coordination   Follow Up Visit Note   01/17/2022 Name: [name] MRN: [id number] DOB: 06-09-1935  [name] is a 86 y.o. year old female who sees Wardell Honour, MD for primary care. I  collaborated with patients primary care provider regarding patient refusal to engage with home health services.  What matters to the patients health and wellness today?  Identify needs in the home    Goals Addressed             This Visit's Progress    Care Coordination Activities       Care Coordination Interventions: Collaboration with patients primary care provider Marlowe Sax, NP who advises a referral was placed to [facility] in July to assist with placement. NP advises she will update an FL2 once placement options are identified Outbound call placed to [facility] who advises they are not currently active with this patient as she has refused to allow home health services into the home Collaboration with Marlowe Sax, NP to advise [facility] is not currently active with the patient. Requested feedback on provider recommendations for patient safety in the home following office visit scheduled for 9/14 SW will continue to follow         SDOH assessments and interventions completed:  No     Care Coordination Interventions Activated:  Yes  Care Coordination Interventions:  Yes, provided   Follow up plan:  SW will continue to follow    Encounter Outcome:  Pt. Visit Completed   Daneen Schick, BSW, CDP Social Worker, Certified Dementia Practitioner Care Coordination [phone]

## 2022-01-19 ENCOUNTER — Encounter: Payer: Medicare Other | Admitting: Family

## 2022-01-19 NOTE — Patient Outreach (Signed)
KASHLYNN KUNDERT 01/30/1936 [id number]  Referral Request:  [facility] Readmission Report  Insurance: [name]  Patient's electronic medical record was reviewed for previous admission and information reviewed encounters  Plan: information will be sent to the [facility] Readmission Report.  Patient active with [facility] CC team.  Natividad Brood, RN BSN [place]  [phone] business mobile phone Toll free office [phone]  *[place]  [phone] Fax number: [phone] [email or web address]

## 2022-01-19 NOTE — Progress Notes (Signed)
  This encounter was created in error - please disregard. No show 

## 2022-01-24 DIAGNOSIS — H15103 Unspecified episcleritis, bilateral: Secondary | ICD-10-CM | POA: Diagnosis not present

## 2022-01-25 ENCOUNTER — Ambulatory Visit: Payer: Self-pay

## 2022-01-25 NOTE — Patient Outreach (Addendum)
  Care Coordination   Follow Up Visit Note   01/25/2022 Name: [name] MRN: [id number] DOB: [birth year]  [name] is a 86 y.o. year old female who sees [name] Honour, MD for primary care. I  collaborated with [name] CAP/DA worker by phone.  What matters to the patients health and wellness today?  To determine if a higher level of care is needed    Goals Addressed             This Visit's Progress    Care Coordination Activities       Care Coordination Interventions: Collaboration with patients primary care provider [name] Sax, NP who advises she would like patient in long term care placement  Collaboration with patients CAP/DA worker [name] [phone]) who advises patient has a caregiver in the home 7 days per week Discussed [name] has contacted APS in the past due to concerns patient needs placement, case was closed due to psychologist determining the patient is competent to make her own decisions Advised by [name] the patient can be rude to her caregivers and they often quit or she refuses assistance if she does not like them. The current caregiver seems to be working out so far Determined the patient does have an emergency response system to use as needed in the home Unsuccessful outbound call placed to the patient to check on her, unable to leave a voice message Collaboration with patients primary care provider to advise of patients refusal for placement at this time Collaboration with [place] to advise of interventions and plan for SW to follow monthly to assist with care coordination needs         SDOH assessments and interventions completed:  No     Care Coordination Interventions Activated:  Yes  Care Coordination Interventions:  Yes, provided   Follow up plan:  SW will continue to follow.    Encounter Outcome:  Pt. Visit Completed   [name], BSW, CDP Social Worker, Certified Dementia  Practitioner [place] [facility]  Care Coordination [phone]

## 2022-01-26 ENCOUNTER — Ambulatory Visit: Payer: Self-pay

## 2022-01-26 NOTE — Patient Outreach (Signed)
  Care Coordination   01/26/2022 Name: [name] MRN: [id number] DOB: 1935/05/16   Care Coordination Outreach Attempts:  An unsuccessful telephone outreach was attempted for a scheduled appointment today.  Follow Up Plan:  Additional outreach attempts will be made to offer the patient care coordination information and services.   Encounter Outcome:  No Answer  Care Coordination Interventions Activated:  No   Care Coordination Interventions:  No, not indicated    Barb Merino, RN, BSN, CCM Care Management Coordinator [place] [facility]  Direct Phone: [phone]

## 2022-02-01 ENCOUNTER — Ambulatory Visit (INDEPENDENT_AMBULATORY_CARE_PROVIDER_SITE_OTHER): Payer: Medicare Other | Admitting: Family Medicine

## 2022-02-01 ENCOUNTER — Encounter: Payer: Self-pay | Admitting: Family Medicine

## 2022-02-01 VITALS — BP 132/82 | HR 76 | Temp 96.6°F

## 2022-02-01 DIAGNOSIS — L03119 Cellulitis of unspecified part of limb: Secondary | ICD-10-CM | POA: Diagnosis not present

## 2022-02-01 DIAGNOSIS — E11628 Type 2 diabetes mellitus with other skin complications: Secondary | ICD-10-CM

## 2022-02-01 DIAGNOSIS — L03116 Cellulitis of left lower limb: Secondary | ICD-10-CM | POA: Diagnosis not present

## 2022-02-01 DIAGNOSIS — I639 Cerebral infarction, unspecified: Secondary | ICD-10-CM | POA: Diagnosis not present

## 2022-02-01 DIAGNOSIS — E1142 Type 2 diabetes mellitus with diabetic polyneuropathy: Secondary | ICD-10-CM

## 2022-02-01 DIAGNOSIS — S91302D Unspecified open wound, left foot, subsequent encounter: Secondary | ICD-10-CM | POA: Diagnosis not present

## 2022-02-01 MED ORDER — AMOXICILLIN-POT CLAVULANATE 875-125 MG PO TABS: 1.0000 | ORAL_TABLET | Freq: Two times a day (BID) | ORAL | 0 refills | Status: DC

## 2022-02-01 NOTE — Progress Notes (Signed)
Provider:  Alain Honey, MD  Careteam: Patient Care Team: Wardell Honour, MD as PCP - General (Family Medicine) Evans Lance, MD as PCP - Electrophysiology (Cardiology) Raynelle Bring, MD as Consulting Physician (Urology) Evans Lance, MD as Consulting Physician (Cardiology) Deneise Lever, MD as Consulting Physician (Pulmonary Disease) [facility] [name], MD as Consulting Physician (Endocrinology) Newt Minion, MD as Consulting Physician (Orthopedic Surgery) Raynelle Bring, MD as Consulting Physician (Urology) Daneen Schick as [place] [facility] [place], Claudette Stapler, RN as [place] [facility]  PLACE OF SERVICE:  [place]  Advanced Directive information    Allergies  Allergen Reactions   Chlordiazepoxide-Clidinium Other (See Comments)    Sleepy,weak   Biaxin [Clarithromycin] Other (See Comments)    Foul taste, abd pain, diarrhea   Tape Other (See Comments)    SKIN IS VERY THIN AND WILL TEAR AND BRUISE EASILY!!   Ultram [Tramadol] Other (See Comments)    Caused tremors   Codeine Other (See Comments)    Upset the stomach    Chief Complaint  Patient presents with   Acute Visit    Patient presents today for left foot swelling, side of foot have pus coming out, left big toe nail came off and now have pus. Been placing triple antibiotic ointment.     HPI: Patient is a 86 y.o. female .  Elderly lady with poorly controlled chronic diabetes who presents today with her caregiver with wounds on her feet bilaterally.  Left 1 received most of the tension there are several areas including great toe which are draining.  She was treated with doxycycline recently without much effect.  She was hospitalized from September 5 to September 8 With hyperglycemia cellulitis and diabetic foot, hypertension, thrombocytopenia Since going home she has a caregiver 4 hours a day and the caregiver decided she  needed to be seen today based on how her feet are looking.  Patient tells me her sugars have been in the 500s recently but this is not atypical for her over a period of many years. Review of Systems:  Review of Systems  Constitutional:  Positive for weight loss.  Respiratory: Negative.    Cardiovascular: Negative.   Skin:        Wounds on feet mostly grade 1 but there is some drainage on the left big toe as well as left lateral foot at the base of the little toe  All other systems reviewed and are negative.   Past Medical History:  Diagnosis Date   Acute ischemic stroke ([place]) 08/10/2021   Anxiety disorder, unspecified    Arthritis    Benign paroxysmal positional vertigo 08/09/2012   Chronic rhinitis    Cognitive communication deficit    Constipation, unspecified    COPD mixed type ([place])    on home O2   Diverticulosis    Esophageal stricture    Essential (primary) hypertension    Gastro-esophageal reflux disease without esophagitis    Glaucoma    Hiatal hernia    Hyperlipidemia, unspecified    Irritable bowel syndrome    Ischemic stroke ([place]) 08/09/2021   Presence of permanent cardiac pacemaker 01/14/2020   Spinal stenosis, lumbar region, without neurogenic claudication    Steatohepatitis    Thrombocytopenia, unspecified (HCC)    Type 2 diabetes mellitus with diabetic polyneuropathy ([place])    Past Surgical History:  Procedure Laterality Date   APPENDECTOMY     CARPAL TUNNEL RELEASE  right hand   CHOLECYSTECTOMY OPEN  1978   COLONOSCOPY  07-2001   mild diverticulosis   ESOPHAGOGASTRODUODENOSCOPY  9485,46-27   H Hernia,es.stricture s/p dil 82F   INTRAOCULAR LENS INSERTION Bilateral    LIVER BIOPSY  0-3500   PACEMAKER IMPLANT N/A 01/14/2020   Procedure: PACEMAKER IMPLANT;  Surgeon: Evans Lance, MD;  Location: [facility];  Service: Cardiovascular;  Laterality: N/A;   PARATHYROID EXPLORATION     TONSILLECTOMY AND ADENOIDECTOMY     TOTAL ABDOMINAL HYSTERECTOMY      ULNAR NERVE TRANSPOSITION  12/07/2011   Procedure: ULNAR NERVE DECOMPRESSION/TRANSPOSITION;this was cancelled-not done  Surgeon: Cammie Sickle., MD;  Location: [facility];  Service: Orthopedics;  Laterality: Right;  right ulnar nerve in situ decompression   ULNAR TUNNEL RELEASE  03/07/2012   Procedure: CUBITAL TUNNEL RELEASE;  Surgeon: Roseanne Kaufman, MD;  Location: [facility];  Service: Orthopedics;  Laterality: Right;  ulnar nerve release at the elbow     Social History:   reports that she quit smoking about 33 years ago. Her smoking use included cigarettes. She has never used smokeless tobacco. She reports that she does not drink alcohol and does not use drugs.  Family History  Problem Relation Age of Onset   Heart disease Father    Lung cancer Mother        small cell;Byssinosis   Lung cancer Sister    Liver cancer Sister        ? mets from another area of the body   Diabetes Other        grandmother   Stroke Maternal Grandfather     Medications: Patient's Medications  New Prescriptions   AMOXICILLIN-CLAVULANATE (AUGMENTIN) 875-125 MG TABLET    Take 1 tablet by mouth every 12 (twelve) hours.  Previous Medications   ACETAMINOPHEN (TYLENOL) 325 MG TABLET    Take 2 tablets (650 mg total) by mouth every 6 (six) hours as needed for mild pain (or Fever >/= 101).   ALBUTEROL (VENTOLIN HFA) 108 (90 BASE) MCG/ACT INHALER    Inhale 1-2 puffs into the lungs every 6 (six) hours as needed for wheezing or shortness of breath.   ALPRAZOLAM (XANAX) 0.5 MG TABLET    Take 0.25 mg by mouth daily as needed for anxiety.   APIXABAN (ELIQUIS) 5 MG TABS TABLET    Take 1 tablet (5 mg total) by mouth 2 (two) times daily.   APIXABAN (ELIQUIS) VTE STARTER PACK (10MG AND 5MG)    Take as directed on package: start with two-71m tablets twice daily for 7 days. On day 8, switch to one-567mtablet twice daily.   BUSPIRONE (BUSPAR) 5 MG TABLET    Take 5 mg by mouth 3 (three)  times daily.   FEEDING SUPPLEMENT (ENSURE ENLIVE / ENSURE PLUS) LIQD    Take 237 mLs by mouth 2 (two) times daily between meals.   FLUOROMETHOLONE (FML) 0.1 % OPHTHALMIC SUSPENSION    Place 1 drop into both eyes 3 (three) times daily.   FLUTICASONE (FLOVENT HFA) 110 MCG/ACT INHALER    Inhale 1 puff into the lungs 2 (two) times daily.   FUROSEMIDE (LASIX) 20 MG TABLET    Take 20 mg by mouth daily.   INSULIN ASPART (NOVOLOG FLEXPEN) 100 UNIT/ML FLEXPEN    Inject 5 Units into the skin 3 (three) times daily with meals.   INSULIN DEGLUDEC (TRESIBA FLEXTOUCH) 200 UNIT/ML FLEXTOUCH PEN    Inject 26 Units into  the skin at bedtime.   IPRATROPIUM-ALBUTEROL (DUONEB) 0.5-2.5 (3) MG/3ML SOLN    Use 1 vial in nebulizer every 8 hours and as needed   LEVALBUTEROL (XOPENEX HFA) 45 MCG/ACT INHALER    Inhale 1-2 puffs into the lungs every 6 (six) hours as needed for wheezing.   METOPROLOL SUCCINATE (TOPROL-XL) 50 MG 24 HR TABLET    TAKE ONE TABLET TWICE A DAY WITH OR IMMEDIATELY FOLLOWING A MEAL.   MULTIPLE VITAMIN (MULTIVITAMIN WITH MINERALS) TABS TABLET    Take 1 tablet by mouth daily.   PANTOPRAZOLE (PROTONIX) 40 MG TABLET    Take 1 tablet (40 mg total) by mouth daily.   PRAVASTATIN (PRAVACHOL) 20 MG TABLET    Take 1 tablet (20 mg total) by mouth daily.   THIAMINE 100 MG TABLET    Take 1 tablet (100 mg total) by mouth daily.   TIOTROPIUM BROMIDE-OLODATEROL (STIOLTO RESPIMAT) 2.5-2.5 MCG/ACT AERS    Inhale 2 puffs into the lungs daily.  Modified Medications   No medications on file  Discontinued Medications   No medications on file    Physical Exam:  Vitals:   02/01/22 1144  BP: 132/82  Pulse: 76  Temp: (!) 96.6 F (35.9 C)  SpO2: 98%   There is no height or weight on file to calculate BMI. Wt Readings from Last 3 Encounters:  01/13/22 121 lb 11.1 oz (55.2 kg)  01/04/22 126 lb 3.2 oz (57.2 kg)  12/28/21 123 lb (55.8 kg)    Physical Exam Vitals and nursing note reviewed.  Constitutional:       Appearance: Normal appearance.  Cardiovascular:     Rate and Rhythm: Normal rate.  Pulmonary:     Effort: Pulmonary effort is normal.  Skin:    Comments: Wound at the base of the fifth little toe was debrided.  There appears to be some undermining of skin consistent with infection.  There is some purulent drainage of the great toe.  Toe was dressed with not a here at dressing and gauze  Neurological:     Mental Status: She is alert.     Labs reviewed: Basic Metabolic Panel: Recent Labs    08/10/21 0438 08/13/21 0209 10/03/21 0329 10/03/21 0731 01/10/22 1342 01/10/22 1350 01/11/22 0923 01/11/22 1054 01/12/22 0349 01/13/22 0534  NA  --    < > 140   < > 125*   < > 136  --  133* 136  K  --    < > 4.1   < > 5.6*   < > 3.8  --  4.0 3.8  CL  --    < > 106   < > 89*   < > 98  --  95* 97*  CO2  --    < > 17*   < > 25   < > 29  --  28 30  GLUCOSE  --    < > 253*   < > 720*   < > 97  --  279* 169*  BUN  --    < > 28*   < > 26*   < > 16  --  16 13  CREATININE  --    < > 1.27*   < > 0.68   < > 0.57  --  0.71 0.49  CALCIUM  --    < > 9.2   < > 9.3   < > 9.0  --  8.8* 8.7*  MG  --   --  1.8  --  1.7  --   --   --   --   --   TSH 1.621  --   --   --   --   --   --  2.226  --   --    < > = values in this interval not displayed.   Liver Function Tests: Recent Labs    12/08/21 1743 12/09/21 0231 01/10/22 1342  AST 17 15 23   ALT 14 10 18   ALKPHOS 79 61 79  BILITOT 0.9 0.7 0.6  PROT 8.9* 7.1 7.5  ALBUMIN 4.6 3.5 3.6   Recent Labs    10/02/21 1835 12/08/21 1743 01/10/22 1342  LIPASE 24 34 69*   Recent Labs    08/09/21 1315  AMMONIA 40*   CBC: Recent Labs    11/09/21 1330 11/09/21 1340 12/08/21 1743 12/08/21 1753 01/10/22 1342 01/10/22 1350 01/11/22 1054 01/12/22 0349  WBC 18.3*   < > 12.7*   < > 9.2  --  14.5* 7.9  NEUTROABS 16.3*  --  8.8*  --  6.8  --   --   --   HGB 17.3*   < > 18.6*   < > 15.8* 17.0* 14.0 13.8  HCT 55.5*   < > 56.8*   < > 49.2* 50.0* 42.1  41.7  MCV 92.7   < > 89.9   < > 94.8  --  89.4 91.4  PLT 336   < > 335   < > 96*  --  289 200   < > = values in this interval not displayed.   Lipid Panel: Recent Labs    05/31/21 1119 08/09/21 1315  CHOL 125 90  HDL 55 38*  LDLCALC 48 42  TRIG 135 50  CHOLHDL 2.3 2.4   TSH: Recent Labs    08/10/21 0438 01/11/22 1054  TSH 1.621 2.226   A1C: Lab Results  Component Value Date   HGBA1C 13.2 (H) 01/10/2022     Assessment/Plan  1. Cellulitis of left lower extremity Patient started on Augmentin and referral made to wound center for help before this gets worse.  Her diabetes is poorly controlled despite her best efforts as well as endocrine neurologist before she started coming to this practice  2. Open wound of left foot, subsequent encounter See above  3. Cellulitis in diabetic foot [facility]) Patient recently treated with doxycycline.  That has been discontinued we will restart antibiotic today, Augmentin  4. Diabetic polyneuropathy associated with type 2 diabetes mellitus ([place]) I believe part of her problem is decreased sensation in the feet and allowing wounds and blisters to begin.  She has been poorly compliant over several years that she has been coming to this practice and with Dr. Dwyane Dee, a diabetologist before.  Need to get wound care involved as there is potential for worsening of this problem  Alain Honey, MD [place] [phone]

## 2022-02-06 ENCOUNTER — Ambulatory Visit: Payer: Medicare Other | Admitting: Internal Medicine

## 2022-02-09 ENCOUNTER — Other Ambulatory Visit (HOSPITAL_COMMUNITY): Payer: Self-pay | Admitting: Internal Medicine

## 2022-02-09 ENCOUNTER — Encounter (HOSPITAL_BASED_OUTPATIENT_CLINIC_OR_DEPARTMENT_OTHER): Payer: Medicare Other | Attending: General Surgery | Admitting: Internal Medicine

## 2022-02-09 ENCOUNTER — Ambulatory Visit: Payer: Self-pay

## 2022-02-09 DIAGNOSIS — E1165 Type 2 diabetes mellitus with hyperglycemia: Secondary | ICD-10-CM | POA: Diagnosis not present

## 2022-02-09 DIAGNOSIS — J449 Chronic obstructive pulmonary disease, unspecified: Secondary | ICD-10-CM | POA: Insufficient documentation

## 2022-02-09 DIAGNOSIS — L97522 Non-pressure chronic ulcer of other part of left foot with fat layer exposed: Secondary | ICD-10-CM | POA: Diagnosis not present

## 2022-02-09 DIAGNOSIS — E11621 Type 2 diabetes mellitus with foot ulcer: Secondary | ICD-10-CM | POA: Diagnosis not present

## 2022-02-09 DIAGNOSIS — M79672 Pain in left foot: Secondary | ICD-10-CM | POA: Insufficient documentation

## 2022-02-09 DIAGNOSIS — Z8673 Personal history of transient ischemic attack (TIA), and cerebral infarction without residual deficits: Secondary | ICD-10-CM | POA: Diagnosis not present

## 2022-02-09 DIAGNOSIS — E1151 Type 2 diabetes mellitus with diabetic peripheral angiopathy without gangrene: Secondary | ICD-10-CM | POA: Diagnosis not present

## 2022-02-09 DIAGNOSIS — Z87891 Personal history of nicotine dependence: Secondary | ICD-10-CM | POA: Insufficient documentation

## 2022-02-09 DIAGNOSIS — E1142 Type 2 diabetes mellitus with diabetic polyneuropathy: Secondary | ICD-10-CM | POA: Diagnosis not present

## 2022-02-09 DIAGNOSIS — L97528 Non-pressure chronic ulcer of other part of left foot with other specified severity: Secondary | ICD-10-CM | POA: Diagnosis not present

## 2022-02-09 DIAGNOSIS — L97521 Non-pressure chronic ulcer of other part of left foot limited to breakdown of skin: Secondary | ICD-10-CM

## 2022-02-09 NOTE — Progress Notes (Signed)
NECIE, WILCOXSON ([id number]) Visit Report for 02/09/2022 Allergy List Details Patient Name: Date of Service: LEMMA, TETRO 02/09/2022 1:45 PM Medical Record Number: [id number] Patient Account Number: [id number] Date of Birth/Sex: Treating RN: April 18, 1936 (86 y.o. [name], [name] Primary Care Ozzie Knobel: Alain Honey Other Clinician: Referring Kariss Longmire: Treating Abhijot Straughter/Extender: Cathleen Fears in Treatment: 0 Allergies Active Allergies Biaxin clarithromycin adhesive tape tramadol codeine Allergy Notes Electronic Signature(s) Signed: 02/09/2022 3:46:02 PM By: Rhae Hammock RN Entered By: Rhae Hammock on 02/09/2022 13:55:57 -------------------------------------------------------------------------------- Arrival Information Details Patient Name: Date of Service: [name] 02/09/2022 1:45 PM Medical Record Number: [id number] Patient Account Number: [id number] Date of Birth/Sex: Treating RN: 10-Apr-1936 (86 y.o. [name], [name] Primary Care Tahisha Hakim: Alain Honey Other Clinician: Referring Laketha Leopard: Treating Krystin Keeven/Extender: Cathleen Fears in Treatment: 0 Visit Information Patient Arrived: Wheel Chair Arrival Time: 14:01 Accompanied By: aide Transfer Assistance: Manual Patient Identification Verified: Yes Secondary Verification Process Completed: Yes Patient Requires Transmission-Based Precautions: No Patient Has Alerts: Yes Patient Alerts: Patient on Blood Thinner Left Abi: 1.1 TBI: 0.1 Electronic Signature(s) Signed: 02/09/2022 3:46:02 PM By: Rhae Hammock RN Entered By: Rhae Hammock on 02/09/2022 15:20:33 -------------------------------------------------------------------------------- Clinic Level of Care Assessment Details Patient Name: Date of Service: ARETA, TERWILLIGER 02/09/2022 1:45 PM Medical Record Number: [id number] Patient Account Number: [id number] Date of  Birth/Sex: Treating RN: [birth year] (86 y.o. [name], [name] Primary Care Yocheved Depner: Alain Honey Other Clinician: Referring Koury Roddy: Treating Math Brazie/Extender: Cathleen Fears in Treatment: 0 Clinic Level of Care Assessment Items TOOL 3 Quantity Score X- 1 0 Use when EandM and Procedure is performed on FOLLOW-UP visit ASSESSMENTS - Nursing Assessment / Reassessment X- 1 10 Reassessment of Co-morbidities (includes updates in patient status) X- 1 5 Reassessment of Adherence to Treatment Plan ASSESSMENTS - Wound and Skin Assessment / Reassessment []  - Points for Wound Assessment can only be taken for a new wound of unknown or different etiology and a procedure is 0 NOT performed to that wound []  - 0 Simple Wound Assessment / Reassessment - one wound X- 3 5 Complex Wound Assessment / Reassessment - multiple wounds []  - 0 Dermatologic / Skin Assessment (not related to wound area) ASSESSMENTS - Focused Assessment X- 1 5 Circumferential Edema Measurements - multi extremities []  - 0 Nutritional Assessment / Counseling / Intervention []  - 0 Lower Extremity Assessment (monofilament, tuning fork, pulses) []  - 0 Peripheral Arterial Disease Assessment (using hand held doppler) ASSESSMENTS - Ostomy and/or Continence Assessment and Care []  - 0 Incontinence Assessment and Management []  - 0 Ostomy Care Assessment and Management (repouching, etc.) PROCESS - Coordination of Care []  - Points for Discharge Coordination can only be taken for a new wound of unknown or different etiology and a procedure 0 is NOT performed to that wound []  - 0 Simple Patient / Family Education for ongoing care X- 1 20 Complex (extensive) Patient / Family Education for ongoing care X- 1 10 Staff obtains Programmer, systems, Records, T Results / Process Orders est []  - 0 Staff telephones HHA, Nursing Homes / Clarify orders / etc []  - 0 Routine Transfer to another Facility  (non-emergent condition) []  - 0 Routine Hospital Admission (non-emergent condition) X- 1 15 New Admissions / Biomedical engineer / Ordering NPWT Apligraf, etc. , []  - 0 Emergency Hospital Admission (emergent condition) []  - 0 Simple Discharge Coordination X- 1 15 Complex (extensive) Discharge Coordination PROCESS - Special Needs []  - 0  Pediatric / Minor Patient Management []  - 0 Isolation Patient Management []  - 0 Hearing / Language / Visual special needs []  - 0 Assessment of Community assistance (transportation, D/C planning, etc.) []  - 0 Additional assistance / Altered mentation []  - 0 Support Surface(s) Assessment (bed, cushion, seat, etc.) INTERVENTIONS - Wound Cleansing / Measurement []  - Points for Wound Cleaning / Measurement, Wound Dressing, Specimen Collection and Specimen taken to lab can only 0 be taken for a new wound of unknown or different etiology and a procedure is NOT performed to that wound []  - 0 Simple Wound Cleansing - one wound X- 3 5 Complex Wound Cleansing - multiple wounds X- 1 5 Wound Imaging (photographs - any number of wounds) []  - 0 Wound Tracing (instead of photographs) []  - 0 Simple Wound Measurement - one wound X- 3 5 Complex Wound Measurement - multiple wounds INTERVENTIONS - Wound Dressings []  - 0 Small Wound Dressing one or multiple wounds X- 3 15 Medium Wound Dressing one or multiple wounds []  - 0 Large Wound Dressing one or multiple wounds INTERVENTIONS - Miscellaneous []  - 0 External ear exam []  - 0 Specimen Collection (cultures, biopsies, blood, body fluids, etc.) []  - 0 Specimen(s) / Culture(s) sent or taken to Lab for analysis []  - 0 Patient Transfer (multiple staff / Civil Service fast streamer / Similar devices) []  - 0 Simple Staple / Suture removal (25 or less) []  - 0 Complex Staple / Suture removal (26 or more) []  - 0 Hypo / Hyperglycemic Management (close monitor of Blood Glucose) X- 1 15 Ankle / Brachial Index (ABI) -  do not check if billed separately X- 1 5 Vital Signs Has the patient been seen at the hospital within the last three years: Yes Total Score: 195 Level Of Care: New/Established - Level 5 Electronic Signature(s) Signed: 02/09/2022 3:46:02 PM By: Rhae Hammock RN Entered By: Rhae Hammock on 02/09/2022 15:45:31 -------------------------------------------------------------------------------- Encounter Discharge Information Details Patient Name: Date of Service: Janyth Contes Y. 02/09/2022 1:45 PM Medical Record Number: [id number] Patient Account Number: [id number] Date of Birth/Sex: Treating RN: 1935-12-24 (86 y.o. [name], [name] Primary Care Quetzal Meany: Alain Honey Other Clinician: Referring Infant Doane: Treating Isidro Monks/Extender: Cathleen Fears in Treatment: 0 Encounter Discharge Information Items Discharge Condition: Stable Ambulatory Status: Wheelchair Discharge Destination: Home Transportation: Private Auto Accompanied By: aide Schedule Follow-up Appointment: Yes Clinical Summary of Care: Patient Declined Electronic Signature(s) Signed: 02/09/2022 3:46:02 PM By: Rhae Hammock RN Entered By: Rhae Hammock on 02/09/2022 15:21:39 -------------------------------------------------------------------------------- Lower Extremity Assessment Details Patient Name: Date of Service: Lamonte Sakai MENDE, BISWELL 02/09/2022 1:45 PM Medical Record Number: [id number] Patient Account Number: [id number] Date of Birth/Sex: Treating RN: May 08, 1936 (86 y.o. [name], [name] Primary Care Masiah Woody: Alain Honey Other Clinician: Referring Frederick Klinger: Treating Drewey Begue/Extender: Cathleen Fears in Treatment: 0 Edema Assessment Assessed: Shirlyn Goltz: Yes] Patrice Paradise: No] E[Left: dema] [Right: :] Calf Left: Right: Point of Measurement: 32 cm From Medial Instep 32 cm Ankle Left: Right: Point of Measurement: 8 cm From Medial Instep 22  cm Knee To Floor Left: Right: From Medial Instep 37 cm Vascular Assessment Pulses: Dorsalis Pedis Palpable: [Left:Yes] Posterior Tibial Palpable: [Left:Yes] Electronic Signature(s) Signed: 02/09/2022 3:46:02 PM By: Rhae Hammock RN Entered By: Rhae Hammock on 02/09/2022 14:16:30 -------------------------------------------------------------------------------- Multi Wound Chart Details Patient Name: Date of Service: [name] 02/09/2022 1:45 PM Medical Record Number: [id number] Patient Account Number: [id number] Date of Birth/Sex: Treating RN: 07-14-1935 (86 y.o. F) Primary Care Lilliah Priego:  Alain Honey Other Clinician: Referring Niani Mourer: Treating Jadie Allington/Extender: Cathleen Fears in Treatment: 0 Vital Signs Height(in): Capillary Blood Glucose(mg/dl): 200 Weight(lbs): Pulse(bpm): 24 Body Mass Index(BMI): Blood Pressure(mmHg): 147/74 Temperature(F): 98.7 Respiratory Rate(breaths/min): 17 Photos: Left, Lateral, Plantar Foot Left, Medial Calcaneus Left T Great oe Wound Location: Gradually Appeared Gradually Appeared Gradually Appeared Wounding Event: Diabetic Wound/Ulcer of the Lower Diabetic Wound/Ulcer of the Lower Diabetic Wound/Ulcer of the Lower Primary Etiology: Extremity Extremity Extremity Glaucoma, Chronic Obstructive Glaucoma, Chronic Obstructive Glaucoma, Chronic Obstructive Comorbid History: Pulmonary Disease (COPD), Pulmonary Disease (COPD), Pulmonary Disease (COPD), Hypertension, Type II Diabetes, Hypertension, Type II Diabetes, Hypertension, Type II Diabetes, Osteoarthritis, Dementia Osteoarthritis, Dementia Osteoarthritis, Dementia 01/10/2022 01/10/2022 01/10/2022 Date Acquired: 0 0 0 Weeks of Treatment: Open Open Open Wound Status: No No No Wound Recurrence: Yes No No Clustered Wound: 2 N/A N/A Clustered Quantity: Yes Yes Yes Pending A mputation on Presentation: 3.5x3.5x0.3 0.3x0.3x0.2  1x1x0.2 Measurements L x W x D (cm) 9.621 0.071 0.785 A (cm) : rea 2.886 0.014 0.157 Volume (cm) : Grade 2 Grade 2 Grade 2 Classification: Medium Medium Medium Exudate A mount: Serosanguineous Serosanguineous Serosanguineous Exudate Type: red, brown red, brown red, brown Exudate Color: Distinct, outline attached Distinct, outline attached Distinct, outline attached Wound Margin: None Present (0%) None Present (0%) Medium (34-66%) Granulation A mount: N/A N/A Red, Pink Granulation Quality: Large (67-100%) Large (67-100%) Medium (34-66%) Necrotic A mount: Eschar, Adherent [place] Necrotic Tissue: Fat Layer (Subcutaneous Tissue): Yes Fat Layer (Subcutaneous Tissue): Yes Fat Layer (Subcutaneous Tissue): Yes Exposed Structures: Fascia: No Fascia: No Fascia: No Tendon: No Tendon: No Tendon: No Muscle: No Muscle: No Muscle: No Joint: No Joint: No Joint: No Bone: No Bone: No Bone: No None None None Epithelialization: Excoriation: No Excoriation: No Excoriation: No Periwound Skin Texture: Induration: No Induration: No Induration: No Callus: No Callus: No Callus: No Crepitus: No Crepitus: No Crepitus: No Rash: No Rash: No Rash: No Scarring: No Scarring: No Scarring: No Maceration: No Dry/Scaly: Yes Maceration: No Periwound Skin Moisture: Dry/Scaly: No Maceration: No Dry/Scaly: No Atrophie Blanche: No Atrophie Blanche: No Atrophie Blanche: No Periwound Skin Color: Cyanosis: No Cyanosis: No Cyanosis: No Ecchymosis: No Ecchymosis: No Ecchymosis: No Erythema: No Erythema: No Erythema: No Hemosiderin Staining: No Hemosiderin Staining: No Hemosiderin Staining: No Mottled: No Mottled: No Mottled: No Pallor: No Pallor: No Pallor: No Rubor: No Rubor: No Rubor: No No Abnormality No Abnormality No Abnormality Temperature: Yes Yes Yes Tenderness on Palpation: Treatment Notes Electronic Signature(s) Signed:  02/09/2022 4:20:55 PM By: Kalman Shan DO Entered By: Kalman Shan on 02/09/2022 14:57:15 -------------------------------------------------------------------------------- Multi-Disciplinary Care Plan Details Patient Name: Date of Service: Janyth Contes Y. 02/09/2022 1:45 PM Medical Record Number: [id number] Patient Account Number: [id number] Date of Birth/Sex: Treating RN: 1935-08-28 (86 y.o. [name], [name] Primary Care Page Lancon: Alain Honey Other Clinician: Referring Ted Leonhart: Treating Eliz Nigg/Extender: Cathleen Fears in Treatment: 0 Active Inactive Orientation to the Wound Care Program Nursing Diagnoses: Knowledge deficit related to the wound healing center program Goals: Patient/caregiver will verbalize understanding of the [place] Program Date Initiated: 02/09/2022 Target Resolution Date: 03/11/2022 Goal Status: Active Interventions: Provide education on orientation to the wound center Notes: Wound/Skin Impairment Nursing Diagnoses: Impaired tissue integrity Knowledge deficit related to ulceration/compromised skin integrity Goals: Patient will have a decrease in wound volume by X% from date: (specify in notes) Date Initiated: 02/09/2022 Target Resolution Date: 03/09/2022 Goal Status: Active Patient/caregiver will verbalize understanding  of skin care regimen Date Initiated: 02/09/2022 Target Resolution Date: 03/10/2022 Goal Status: Active Ulcer/skin breakdown will have a volume reduction of 30% by week 4 Date Initiated: 02/09/2022 Target Resolution Date: 03/10/2022 Goal Status: Active Interventions: Assess patient/caregiver ability to obtain necessary supplies Assess patient/caregiver ability to perform ulcer/skin care regimen upon admission and as needed Assess ulceration(s) every visit Notes: Electronic Signature(s) Signed: 02/09/2022 3:46:02 PM By: Rhae Hammock RN Entered By: Rhae Hammock on  02/09/2022 14:46:48 -------------------------------------------------------------------------------- Pain Assessment Details Patient Name: Date of Service: [name] 02/09/2022 1:45 PM Medical Record Number: [id number] Patient Account Number: [id number] Date of Birth/Sex: Treating RN: 31-Oct-1935 (86 y.o. [name], [name] Primary Care Daejah Klebba: Alain Honey Other Clinician: Referring Deah Ottaway: Treating Ronte Parker/Extender: Cathleen Fears in Treatment: 0 Active Problems Location of Pain Severity and Description of Pain Patient Has Paino No Site Locations With Dressing Change: Yes Duration of the Pain. Constant / Intermittento Intermittent Rate the pain. Current Pain Level: 7 Worst Pain Level: 10 Least Pain Level: 0 Tolerable Pain Level: 7 Character of Pain Describe the Pain: Aching Pain Management and Medication Current Pain Management: Medication: No Cold Application: No Rest: No Massage: No Activity: No T.E.N.S.: No Heat Application: No Leg drop or elevation: No Is the Current Pain Management Adequate: Adequate How does your wound impact your activities of daily livingo Sleep: No Bathing: No Appetite: No Relationship With Others: No Bladder Continence: No Emotions: No Bowel Continence: No Work: No Toileting: No Drive: No Dressing: No Hobbies: No Electronic Signature(s) Signed: 02/09/2022 3:46:02 PM By: Rhae Hammock RN Entered By: Rhae Hammock on 02/09/2022 14:16:03 -------------------------------------------------------------------------------- Patient/Caregiver Education Details Patient Name: Date of Service: Lamonte Sakai Clarene Essex 10/5/2023andnbsp1:45 PM Medical Record Number: [id number] Patient Account Number: [id number] Date of Birth/Gender: Treating RN: [birth year] (86 y.o. [name], [name] Primary Care Physician: Alain Honey Other Clinician: Referring Physician: Treating Physician/Extender:  Cathleen Fears in Treatment: 0 Education Assessment Education Provided To: Patient Education Topics Provided Victor: o Methods: Explain/Verbal Responses: State content correctly Electronic Signature(s) Signed: 02/09/2022 3:46:02 PM By: Rhae Hammock RN Entered By: Rhae Hammock on 02/09/2022 14:47:10 -------------------------------------------------------------------------------- Wound Assessment Details Patient Name: Date of Service: Lamonte Sakai FLORA, PARKS 02/09/2022 1:45 PM Medical Record Number: [id number] Patient Account Number: [id number] Date of Birth/Sex: Treating RN: 21-Apr-1936 (86 y.o. [name], [name] Primary Care Burnis Halling: Alain Honey Other Clinician: Referring Xin Klawitter: Treating Anessa Charley/Extender: Cathleen Fears in Treatment: 0 Wound Status Wound Number: 1 Primary Diabetic Wound/Ulcer of the Lower Extremity Etiology: Wound Location: Left, Lateral, Plantar Foot Wound Open Wounding Event: Gradually Appeared Status: Date Acquired: 01/10/2022 Comorbid Glaucoma, Chronic Obstructive Pulmonary Disease (COPD), Weeks Of Treatment: 0 History: Hypertension, Type II Diabetes, Osteoarthritis, Dementia Clustered Wound: Yes Photos Wound Measurements Length: (cm) 3.5 Width: (cm) 3.5 Depth: (cm) 0.3 Clustered Quantity: 2 Area: (cm) 9.621 Volume: (cm) 2.886 % Reduction in Area: % Reduction in Volume: Epithelialization: None Tunneling: No Undermining: No Wound Description Classification: Grade 2 Wound Margin: Distinct, outline attached Exudate Amount: Medium Exudate Type: Serosanguineous Exudate Color: red, brown Foul Odor After Cleansing: No Slough/Fibrino Yes Wound Bed Granulation Amount: None Present (0%) Exposed Structure Necrotic Amount: Large (67-100%) Fascia Exposed: No Necrotic Quality: Eschar, Adherent Slough Fat Layer (Subcutaneous Tissue) Exposed: Yes Tendon  Exposed: No Muscle Exposed: No Joint Exposed: No Bone Exposed: No Periwound Skin Texture Texture Color No Abnormalities Noted: No No Abnormalities Noted: No Callus: No Atrophie Blanche:  No Crepitus: No Cyanosis: No Excoriation: No Ecchymosis: No Induration: No Erythema: No Rash: No Hemosiderin Staining: No Scarring: No Mottled: No Pallor: No Moisture Rubor: No No Abnormalities Noted: No Dry / Scaly: No Temperature / Pain Maceration: No Temperature: No Abnormality Tenderness on Palpation: Yes Electronic Signature(s) Signed: 02/09/2022 3:46:02 PM By: Rhae Hammock RN Entered By: Rhae Hammock on 02/09/2022 14:31:08 -------------------------------------------------------------------------------- Wound Assessment Details Patient Name: Date of Service: Lamonte Sakai MACKENSIE, PILSON 02/09/2022 1:45 PM Medical Record Number: [id number] Patient Account Number: [id number] Date of Birth/Sex: Treating RN: Jan 10, 1936 (86 y.o. [name], [name] Primary Care Basha Krygier: Alain Honey Other Clinician: Referring Miriam Liles: Treating Eriyana Sweeten/Extender: Cathleen Fears in Treatment: 0 Wound Status Wound Number: 2 Primary Diabetic Wound/Ulcer of the Lower Extremity Etiology: Wound Location: Left, Medial Calcaneus Wound Open Wounding Event: Gradually Appeared Status: Date Acquired: 01/10/2022 Comorbid Glaucoma, Chronic Obstructive Pulmonary Disease (COPD), Weeks Of Treatment: 0 History: Hypertension, Type II Diabetes, Osteoarthritis, Dementia Clustered Wound: No Photos Wound Measurements Length: (cm) 0.3 Width: (cm) 0.3 Depth: (cm) 0.2 Area: (cm) 0.071 Volume: (cm) 0.014 % Reduction in Area: % Reduction in Volume: Epithelialization: None Tunneling: No Undermining: No Wound Description Classification: Grade 2 Wound Margin: Distinct, outline attached Exudate Amount: Medium Exudate Type: Serosanguineous Exudate Color: red, brown Foul Odor After  Cleansing: No Slough/Fibrino Yes Wound Bed Granulation Amount: None Present (0%) Exposed Structure Necrotic Amount: Large (67-100%) Fascia Exposed: No Necrotic Quality: Adherent [place]ough Fat Layer (Subcutaneous Tissue) Exposed: Yes Tendon Exposed: No Muscle Exposed: No Joint Exposed: No Bone Exposed: No Periwound Skin Texture Texture Color No Abnormalities Noted: No No Abnormalities Noted: No Callus: No Atrophie Blanche: No Crepitus: No Cyanosis: No Excoriation: No Ecchymosis: No Induration: No Erythema: No Rash: No Hemosiderin Staining: No Scarring: No Mottled: No Pallor: No Moisture Rubor: No No Abnormalities Noted: No Dry / Scaly: Yes Temperature / Pain Maceration: No Temperature: No Abnormality Tenderness on Palpation: Yes Electronic Signature(s) Signed: 02/09/2022 3:46:02 PM By: Rhae Hammock RN Entered By: Rhae Hammock on 02/09/2022 14:31:29 -------------------------------------------------------------------------------- Wound Assessment Details Patient Name: Date of Service: Lamonte Sakai BOBBYE, PETTI 02/09/2022 1:45 PM Medical Record Number: [id number] Patient Account Number: [id number] Date of Birth/Sex: Treating RN: 08-Nov-1935 (86 y.o. [name], [name] Primary Care Jermiya Reichl: Alain Honey Other Clinician: Referring Joshu Furukawa: Treating Lylla Eifler/Extender: Cathleen Fears in Treatment: 0 Wound Status Wound Number: 3 Primary Diabetic Wound/Ulcer of the Lower Extremity Etiology: Wound Location: Left T Great oe Wound Open Wounding Event: Gradually Appeared Status: Date Acquired: 01/10/2022 Comorbid Glaucoma, Chronic Obstructive Pulmonary Disease (COPD), Weeks Of Treatment: 0 History: Hypertension, Type II Diabetes, Osteoarthritis, Dementia Clustered Wound: No Photos Wound Measurements Length: (cm) 1 Width: (cm) 1 Depth: (cm) 0.2 Area: (cm) 0.785 Volume: (cm) 0.157 % Reduction in Area: % Reduction in  Volume: Epithelialization: None Tunneling: No Undermining: No Wound Description Classification: Grade 2 Wound Margin: Distinct, outline attached Exudate Amount: Medium Exudate Type: Serosanguineous Exudate Color: red, brown Foul Odor After Cleansing: No Slough/Fibrino Yes Wound Bed Granulation Amount: Medium (34-66%) Exposed Structure Granulation Quality: Red, Pink Fascia Exposed: No Necrotic Amount: Medium (34-66%) Fat Layer (Subcutaneous Tissue) Exposed: Yes Necrotic Quality: Adherent Slough Tendon Exposed: No Muscle Exposed: No Joint Exposed: No Bone Exposed: No Periwound Skin Texture Texture Color No Abnormalities Noted: No No Abnormalities Noted: No Callus: No Atrophie Blanche: No Crepitus: No Cyanosis: No Excoriation: No Ecchymosis: No Induration: No Erythema: No Rash: No Hemosiderin Staining: No Scarring: No Mottled: No Pallor: No Moisture Rubor: No No  Abnormalities Noted: No Dry / Scaly: No Temperature / Pain Maceration: No Temperature: No Abnormality Tenderness on Palpation: Yes Electronic Signature(s) Signed: 02/09/2022 3:46:02 PM By: Rhae Hammock RN Entered By: Rhae Hammock on 02/09/2022 14:31:47 -------------------------------------------------------------------------------- Vitals Details Patient Name: Date of Service: [name] 02/09/2022 1:45 PM Medical Record Number: [id number] Patient Account Number: [id number] Date of Birth/Sex: Treating RN: 20-Feb-1936 (86 y.o. [name], [name] Primary Care Rynlee Lisbon: Alain Honey Other Clinician: Referring Lottie Sigman: Treating Jadian Karman/Extender: Cathleen Fears in Treatment: 0 Vital Signs Time Taken: 14:02 Temperature (F): 98.7 Pulse (bpm): 74 Respiratory Rate (breaths/min): 17 Blood Pressure (mmHg): 147/74 Capillary Blood Glucose (mg/dl): 200 Reference Range: 80 - 120 mg / dl Electronic Signature(s) Signed: 02/09/2022 3:46:02 PM By: Rhae Hammock RN Entered By: Rhae Hammock on 02/09/2022 14:02:15

## 2022-02-09 NOTE — Progress Notes (Signed)
VIEVA, BRUMMITT ([id number]) Visit Report for 02/09/2022 Abuse Risk Screen Details Patient Name: Date of Service: [name], [name] 02/09/2022 1:45 PM Medical Record Number: [id number] Patient Account Number: [id number] Date of Birth/Sex: Treating RN: 1935/06/30 (86 y.o. [name], [name] Primary Care Cleland Simkins: Alain Honey Other Clinician: Referring Amarian Botero: Treating Aylinn Rydberg/Extender: Cathleen Fears in Treatment: 0 Abuse Risk Screen Items Answer ABUSE RISK SCREEN: Has anyone close to you tried to hurt or harm you recentlyo No Do you feel uncomfortable with anyone in your familyo No Has anyone forced you do things that you didnt want to doo No Electronic Signature(s) Signed: 02/09/2022 3:46:02 PM By: Rhae Hammock RN Entered By: Rhae Hammock on 02/09/2022 14:09:59 -------------------------------------------------------------------------------- Activities of Daily Living Details Patient Name: Date of Service: [name], [name] 02/09/2022 1:45 PM Medical Record Number: [id number] Patient Account Number: [id number] Date of Birth/Sex: Treating RN: 12/02/1935 (86 y.o. [name], [name] Primary Care Breindel Collier: Alain Honey Other Clinician: Referring Gal Feldhaus: Treating Corabelle Spackman/Extender: Cathleen Fears in Treatment: 0 Activities of Daily Living Items Answer Activities of Daily Living (Please select one for each item) Drive Automobile Need Assistance T Medications ake Need Assistance Use T elephone Need Assistance Care for Appearance Need Assistance Use T oilet Need Assistance Bath / Shower Need Assistance Dress Self Need Assistance Feed Self Need Assistance Walk Need Assistance Get In / Out Bed Need Assistance Housework Need Assistance Prepare Meals Need Assistance Handle Money Need Assistance Shop for Self Need Assistance Electronic Signature(s) Signed: 02/09/2022 3:46:02 PM By: Rhae Hammock  RN Entered By: Rhae Hammock on 02/09/2022 14:10:19 -------------------------------------------------------------------------------- Education Screening Details Patient Name: Date of Service: [name] 02/09/2022 1:45 PM Medical Record Number: [id number] Patient Account Number: [id number] Date of Birth/Sex: Treating RN: February 21, 1936 (86 y.o. [name], [name] Primary Care Riti Rollyson: Alain Honey Other Clinician: Referring Mosie Angus: Treating Tascha Casares/Extender: Cathleen Fears in Treatment: 0 Primary Learner Assessed: Patient Learning Preferences/Education Level/Primary Language Learning Preference: Explanation, Demonstration, Communication Board, Printed Material Highest Education Level: High School Preferred Language: English Cognitive Barrier Language Barrier: No Translator Needed: No Memory Deficit: No Emotional Barrier: No Cultural/Religious Beliefs Affecting Medical Care: No Physical Barrier Impaired Vision: Yes Glasses Impaired Hearing: No Decreased Hand dexterity: No Knowledge/Comprehension Knowledge Level: High Comprehension Level: High Ability to understand written instructions: High Ability to understand verbal instructions: High Motivation Anxiety Level: Calm Cooperation: Cooperative Education Importance: Denies Need Interest in Health Problems: Asks Questions Perception: Coherent Willingness to Engage in Self-Management High Activities: Readiness to Engage in Self-Management High Activities: Electronic Signature(s) Signed: 02/09/2022 3:46:02 PM By: Rhae Hammock RN Entered By: Rhae Hammock on 02/09/2022 14:10:42 -------------------------------------------------------------------------------- Fall Risk Assessment Details Patient Name: Date of Service: [name] 02/09/2022 1:45 PM Medical Record Number: [id number] Patient Account Number: [id number] Date of Birth/Sex: Treating RN: [birth year] (86  y.o. [name], [name] Primary Care Jazper Nikolai: Alain Honey Other Clinician: Referring Kindred Heying: Treating Spenser Harren/Extender: Cathleen Fears in Treatment: 0 Fall Risk Assessment Items Have you had 2 or more falls in the last 12 monthso 0 Yes Have you had any fall that resulted in injury in the last 12 monthso 0 Yes FALLS RISK SCREEN History of falling - immediate or within 3 months 25 Yes Secondary diagnosis (Do you have 2 or more medical diagnoseso) 0 No Ambulatory aid None/bed rest/wheelchair/nurse 0 Yes Crutches/cane/walker 15 Yes Furniture 0 No Intravenous therapy Access/Saline/Heparin Lock 0 No Gait/Transferring Normal/ bed  rest/ wheelchair 0 No Weak (short steps with or without shuffle, stooped but able to lift head while walking, may seek 10 Yes support from furniture) Impaired (short steps with shuffle, may have difficulty arising from chair, head down, impaired 0 No balance) Mental Status Oriented to own ability 0 No Electronic Signature(s) Signed: 02/09/2022 3:46:02 PM By: Rhae Hammock RN Entered By: Rhae Hammock on 02/09/2022 14:15:13 -------------------------------------------------------------------------------- Foot Assessment Details Patient Name: Date of Service: [name] 02/09/2022 1:45 PM Medical Record Number: [id number] Patient Account Number: [id number] Date of Birth/Sex: Treating RN: 11-25-1935 (86 y.o. [name], [name] Primary Care Torra Pala: Alain Honey Other Clinician: Referring Stepheny Canal: Treating Shronda Boeh/Extender: Cathleen Fears in Treatment: 0 Foot Assessment Items Site Locations + = Sensation present, - = Sensation absent, C = Callus, U = Ulcer R = Redness, W = Warmth, M = Maceration, PU = Pre-ulcerative lesion F = Fissure, S = Swelling, D = Dryness Assessment Right: Left: Other Deformity: No No Prior Foot Ulcer: No No Prior Amputation: No No Charcot  Joint: No No Ambulatory Status: Ambulatory With Help Assistance Device: Cane Gait: Administrator, arts) Signed: 02/09/2022 3:46:02 PM By: Rhae Hammock RN Entered By: Rhae Hammock on 02/09/2022 14:15:34 -------------------------------------------------------------------------------- Nutrition Risk Screening Details Patient Name: Date of Service: [name], [name] 02/09/2022 1:45 PM Medical Record Number: [id number] Patient Account Number: [id number] Date of Birth/Sex: Treating RN: Feb 04, 1936 (86 y.o. [name], [name] Primary Care Carah Barrientes: Alain Honey Other Clinician: Referring Tamirah George: Treating Sheylin Scharnhorst/Extender: Cathleen Fears in Treatment: 0 Height (in): Weight (lbs): Body Mass Index (BMI): Nutrition Risk Screening Items Score Screening NUTRITION RISK SCREEN: I have an illness or condition that made me change the kind and/or amount of food I eat 0 No I eat fewer than two meals per day 0 No I eat few fruits and vegetables, or milk products 0 No I have three or more drinks of beer, liquor or wine almost every day 0 No I have tooth or mouth problems that make it hard for me to eat 0 No I don't always have enough money to buy the food I need 0 No I eat alone most of the time 0 No I take three or more different prescribed or over-the-counter drugs a day 0 No Without wanting to, I have lost or gained 10 pounds in the last six months 0 No I am not always physically able to shop, cook and/or feed myself 0 No Nutrition Protocols Good Risk Protocol 0 No interventions needed Moderate Risk Protocol High Risk Proctocol Risk Level: Good Risk Score: 0 Electronic Signature(s) Signed: 02/09/2022 3:46:02 PM By: Rhae Hammock RN Entered By: Rhae Hammock on 02/09/2022 14:10:57

## 2022-02-09 NOTE — Patient Outreach (Signed)
  Care Coordination   02/09/2022 Name: [name] MRN: [id number] DOB: Jun 15, 1935   Care Coordination Outreach Attempts:  An unsuccessful telephone outreach was attempted for a scheduled appointment today.  Follow Up Plan:  Additional outreach attempts will be made to offer the patient care coordination information and services.   Encounter Outcome:  No Answer  Care Coordination Interventions Activated:  No   Care Coordination Interventions:  No, not indicated    Barb Merino, RN, BSN, CCM Care Management Coordinator [facility] Direct Phone: [phone]

## 2022-02-09 NOTE — Progress Notes (Addendum)
[name], [name] ([id number]) [id number] Page 1 of 12 Visit Report for 02/09/2022 Chief Complaint Document Details Patient Name: Date of Service: [name], [name] 02/09/2022 1:45 PM Medical Record Number: [id number] Patient Account Number: [id number] Date of Birth/Sex: Treating RN: 11/15/1935 (86 y.o. F) Primary Care Provider: Alain Honey Other Clinician: Referring Provider: Treating Provider/Extender: Cathleen Fears in Treatment: 0 Information Obtained from: Patient Chief Complaint 02/09/2022; left foot wounds Electronic Signature(s) Signed: 02/09/2022 4:20:55 PM By: Kalman Shan DO Entered By: Kalman Shan on 02/09/2022 14:57:25 -------------------------------------------------------------------------------- Debridement Details Patient Name: Date of Service: [name] 02/09/2022 1:45 PM Medical Record Number: [id number] Patient Account Number: [id number] Date of Birth/Sex: Treating RN: 09/18/1935 (86 y.o. [name], [name] Primary Care Provider: Alain Honey Other Clinician: Referring Provider: Treating Provider/Extender: Cathleen Fears in Treatment: 0 Debridement Performed for Assessment: Wound #1 Left,Lateral,Plantar Foot Performed By: Physician Kalman Shan, DO Debridement Type: Chemical/Enzymatic/Mechanical Agent Used: gauze and wound cleanser Severity of Tissue Pre Debridement: Fat layer exposed Level of Consciousness (Pre-procedure): Awake and Alert Pre-procedure Verification/Time Out No Taken: Bleeding: Minimum Hemostasis Achieved: Pressure Response to Treatment: Procedure was tolerated well Level of Consciousness (Post- Awake and Alert procedure): Post Debridement Measurements of Total Wound Length: (cm) 3.5 Width: (cm) 3.5 Depth: (cm) 0.3 Volume: (cm) 2.886 Character of Wound/Ulcer Post Debridement: Improved Severity of Tissue Post Debridement: Fat  layer exposed Post Procedure Diagnosis Same as Pre-procedure Electronic Signature(s) Signed: 02/13/2022 12:26:27 PM By: Kalman Shan DO Signed: 05/10/2022 5:36:43 PM By: Rhae Hammock RN [name] ([id number]) [id number] Page 2 of 12 Entered By: Rhae Hammock on 02/13/2022 10:44:19 -------------------------------------------------------------------------------- Debridement Details Patient Name: Date of Service: [name], [name] 02/09/2022 1:45 PM Medical Record Number: [id number] Patient Account Number: [id number] Date of Birth/Sex: Treating RN: August 24, 1935 (86 y.o. [name], [name] Primary Care Provider: Alain Honey Other Clinician: Referring Provider: Treating Provider/Extender: Cathleen Fears in Treatment: 0 Debridement Performed for Assessment: Wound #2 Left,Medial Calcaneus Performed By: Physician Kalman Shan, DO Debridement Type: Chemical/Enzymatic/Mechanical Agent Used: gauze and wound cleanser Severity of Tissue Pre Debridement: Fat layer exposed Level of Consciousness (Pre-procedure): Awake and Alert Pre-procedure Verification/Time Out No Taken: Bleeding: Minimum Hemostasis Achieved: Pressure Response to Treatment: Procedure was tolerated well Level of Consciousness (Post- Awake and Alert procedure): Post Debridement Measurements of Total Wound Length: (cm) 0.3 Width: (cm) 0.3 Depth: (cm) 0.2 Volume: (cm) 0.014 Character of Wound/Ulcer Post Debridement: Improved Severity of Tissue Post Debridement: Fat layer exposed Post Procedure Diagnosis Same as Pre-procedure Electronic Signature(s) Signed: 02/13/2022 12:26:27 PM By: Kalman Shan DO Signed: 05/10/2022 5:36:43 PM By: Rhae Hammock RN Entered By: Rhae Hammock on 02/13/2022 10:44:40 -------------------------------------------------------------------------------- Debridement Details Patient Name: Date of Service: [name] 02/09/2022 1:45 PM Medical Record Number: [id number] Patient Account Number: [id number] Date of Birth/Sex: Treating RN: 07-02-1935 (86 y.o. [name], [name] Primary Care Provider: Alain Honey Other Clinician: Referring Provider: Treating Provider/Extender: Cathleen Fears in Treatment: 0 Debridement Performed for Assessment: Wound #3 Left T Great oe Performed By: Physician Kalman Shan, DO Debridement Type: Chemical/Enzymatic/Mechanical Agent Used: gauze and wound cleanser Severity of Tissue Pre Debridement: Fat layer exposed Level of Consciousness (Pre-procedure): Awake and Alert Pre-procedure Verification/Time Out No Taken: Bleeding: Minimum Hemostasis Achieved: Pressure Response to Treatment: Procedure was tolerated well Level of Consciousness [name], [name] ([id number]) [id number] Page 3 of 12 Level of Consciousness (Post- Awake and Alert procedure):  Post Debridement Measurements of Total Wound Length: (cm) 1 Width: (cm) 1 Depth: (cm) 0.2 Volume: (cm) 0.157 Character of Wound/Ulcer Post Debridement: Improved Severity of Tissue Post Debridement: Fat layer exposed Post Procedure Diagnosis Same as Pre-procedure Electronic Signature(s) Signed: 02/13/2022 12:26:27 PM By: Kalman Shan DO Signed: 05/10/2022 5:36:43 PM By: Rhae Hammock RN Entered By: Rhae Hammock on 02/13/2022 10:45:03 -------------------------------------------------------------------------------- HPI Details Patient Name: Date of Service: [name] 02/09/2022 1:45 PM Medical Record Number: [id number] Patient Account Number: [id number] Date of Birth/Sex: Treating RN: [birth year] (86 y.o. F) Primary Care Provider: Alain Honey Other Clinician: Referring Provider: Treating Provider/Extender: Cathleen Fears in Treatment: 0 History of Present Illness HPI Description:  Admission 02/09/2022 [name] is an 86 year old female with a past medical history of uncontrolled insulin-dependent type 2 diabetes with most recent hemoglobin Y4I of 13 and complicated by peripheral neuropathy, COPD, and CVA that presents to the clinic for a 1 to 73-monthhistory of nonhealing ulcers to the left foot. She has visited her primary care physician for this issue on 02/01/2022 and was started on Augmentin for left lower extremity cellulitis and wound infection. Caregivers present in the room. Per patient and caregiver her left lower extremity cellulitis has improved. She still has some slight erythema to the dorsal aspect of the foot. She reports pain to the wound sites. She had an ABI with TBI's done on 01/11/2022 that showed a TBI of 0.15 on the left and monophasic PTA and DP. She also had an x-ray of the left foot on 01/10/2022 that showed soft tissue swelling with no acute bony abnormality. She currently denies systemic signs of infection. Electronic Signature(s) Signed: 02/09/2022 4:20:55 PM By: HKalman ShanDO Entered By: HKalman Shanon 02/09/2022 14:59:53 -------------------------------------------------------------------------------- Physical Exam Details Patient Name: Date of Service: HLUDWIKA, RODD10/09/2021 1:45 PM Medical Record Number: [id number] Account Number: [id number] of Birth/Sex: Treating RN: [birth year](86y.o. F) Primary Care Provider: MAlain HoneyOther Clinician: Referring Provider: Treating Provider/Extender: HCathleen Fearsin Treatment: 0 Constitutional respirations regular, non-labored and within target range for patient..Marland KitchenPsychiatric pleasant and cooperative. HTANISA, [name]([id number] Page 4 of 12 Notes Left foot: T the lateral aspect there are 2 wounds with necrotic tissue and nonviable tissue throughout. T the heel and great toe there are open  wounds with o o granulation tissue throughout. Slight increase in erythema and warmth to the dorsal aspect of the foot. Weak dorsalis pedal pulses On Doppler Electronic Signature(s) Signed: 02/09/2022 4:20:55 PM By: HKalman ShanDO Entered By: HKalman Shanon 02/09/2022 15:01:18 -------------------------------------------------------------------------------- Physician Orders Details Patient Name: Date of Service: HHelmut Muster10/09/2021 1:45 PM Medical Record Number: [id number] Account Number: [id number] of Birth/Sex: Treating RN: [birth year](86y.o. FTonita Phoenix [name] Primary Care Provider: MAlain HoneyOther Clinician: Referring Provider: Treating Provider/Extender: HCathleen Fearsin Treatment: 0 Verbal / Phone Orders: No Diagnosis Coding ICD-10 Coding Code Description E11.621 Type 2 diabetes mellitus with foot ulcer L97.522 Non-pressure chronic ulcer of other part of left foot with fat layer exposed L97.528 Non-pressure chronic ulcer of other part of left foot with other specified severity Follow-up Appointments ppointment in 1 week. - w/ Dr. HHeber Carolinaand LElias Else# 9 Return A Anesthetic (In clinic) Topical Lidocaine 5% applied to wound bed Bathing/ Shower/ Hygiene May shower with protection but do not get wound dressing(s) wet. Edema Control -  Lymphedema / SCD / Other Elevate legs to the level of the heart or above for 30 minutes daily and/or when sitting, a frequency of: Avoid standing for long periods of time. Off-Loading Other: - Keep pressure off of Left foot Wound Treatment Wound #1 - Foot Wound Laterality: Plantar, Left, Lateral Cleanser: Soap and Water 1 x Per Day/15 Days Discharge Instructions: May shower and wash wound with dial antibacterial soap and water prior to dressing change. Cleanser: Wound Cleanser (DME) (Generic) 1 x Per Day/15 Days Discharge Instructions: Cleanse the wound with wound cleanser  prior to applying a clean dressing using gauze sponges, not tissue or cotton balls. Prim Dressing: MediHoney Gel, tube 1.5 (oz) 1 x Per Day/15 Days ary Discharge Instructions: Apply to wound bed as instructed Secondary Dressing: Optifoam Non-Adhesive Dressing, 4x4 in (DME) (Generic) 1 x Per Day/15 Days Discharge Instructions: Apply over primary dressing as directed. Secondary Dressing: Woven Gauze Sponge, Non-Sterile 4x4 in (DME) (Generic) 1 x Per Day/15 Days Discharge Instructions: Apply over primary dressing as directed. Secured With: [employer], 4.5x3.1 (in/yd) (DME) (Generic) 1 x Per Day/15 Days Discharge Instructions: Secure with Kerlix as directed. Secured With: 52M Medipore H Soft Cloth Surgical T ape, 4 x 10 (in/yd) (DME) (Generic) 1 x Per Day/15 Days Discharge Instructions: Secure with tape as directed. [name], [name] ([id number]) [id number] Page 5 of 12 Wound #2 - Calcaneus Wound Laterality: Left, Medial Cleanser: Soap and Water 1 x Per Day/15 Days Discharge Instructions: May shower and wash wound with dial antibacterial soap and water prior to dressing change. Cleanser: Wound Cleanser (DME) (Generic) 1 x Per Day/15 Days Discharge Instructions: Cleanse the wound with wound cleanser prior to applying a clean dressing using gauze sponges, not tissue or cotton balls. Prim Dressing: MediHoney Gel, tube 1.5 (oz) 1 x Per Day/15 Days ary Discharge Instructions: Apply to wound bed as instructed Secondary Dressing: Optifoam Non-Adhesive Dressing, 4x4 in (DME) (Generic) 1 x Per Day/15 Days Discharge Instructions: Apply over primary dressing as directed. Secondary Dressing: Woven Gauze Sponge, Non-Sterile 4x4 in (DME) (Generic) 1 x Per Day/15 Days Discharge Instructions: Apply over primary dressing as directed. Secured With: [employer], 4.5x3.1 (in/yd) (DME) (Generic) 1 x Per Day/15 Days Discharge Instructions: Secure with Kerlix as  directed. Secured With: 52M Medipore H Soft Cloth Surgical T ape, 4 x 10 (in/yd) (DME) (Generic) 1 x Per Day/15 Days Discharge Instructions: Secure with tape as directed. Secured With: [employer] Size 5, 10 (yds) (DME) (Generic) 1 x Per Day/15 Days Wound #3 - T Great oe Wound Laterality: Left Cleanser: Soap and Water 1 x Per Day/15 Days Discharge Instructions: May shower and wash wound with dial antibacterial soap and water prior to dressing change. Cleanser: Wound Cleanser (DME) (Generic) 1 x Per Day/15 Days Discharge Instructions: Cleanse the wound with wound cleanser prior to applying a clean dressing using gauze sponges, not tissue or cotton balls. Prim Dressing: MediHoney Gel, tube 1.5 (oz) 1 x Per Day/15 Days ary Discharge Instructions: Apply to wound bed as instructed Secondary Dressing: Woven Gauze Sponge, Non-Sterile 4x4 in (DME) (Generic) 1 x Per Day/15 Days Discharge Instructions: Apply over primary dressing as directed. Secured With: Child psychotherapist, Sterile 2x75 (in/in) (DME) (Generic) 1 x Per Day/15 Days Discharge Instructions: Secure with stretch gauze as directed. Secured With: 52M Medipore H Soft Cloth Surgical T ape, 4 x 10 (in/yd) (DME) (Generic) 1 x Per Day/15 Days Discharge Instructions: Secure with tape as directed. Consults Vascular - ***URGENT*** Send  to Dr. Gwenlyn Found for a Consult for abnormal TBI's of both feet Radiology Magnetic Resonance Imaging (MRI) - ***URGENT*** LEFT FOOT r/t possible infection Patient Medications llergies: Biaxin, clarithromycin, adhesive tape, tramadol, codeine A Notifications Medication Indication Start End 02/09/2022 lidocaine DOSE topical 5 % gel - gel topical Electronic Signature(s) Signed: 02/09/2022 4:20:55 PM By: Kalman Shan DO Entered By: Kalman Shan on 02/09/2022 15:01:39 Prescription 02/09/2022 [name] ([id number]) [id number] Page 6 of  12 -------------------------------------------------------------------------------- [name]. Kalman Shan DO Patient Name: Provider: [birth year] [id number] Date of Birth: NPI#: [id number] Sex: DEA #: [phone] Phone #: License #: [place] Patient Address: [place], [place] [address] [place], [place] [address] [phone] Allergies Biaxin; clarithromycin; adhesive tape; tramadol; codeine Provider's Orders Magnetic Resonance Imaging (MRI) - ***URGENT*** LEFT FOOT r/t possible infection Hand Signature: Date(s): Prescription 02/09/2022 [name]. Kalman Shan DO Patient Name: Provider: 1935/12/06 [id number] Date of Birth: NPI#: [id number] Sex: DEA #: [phone] Phone #: License #: [place] Patient Address: [place], [place] [address] [place], [place] [address] [phone] Allergies Biaxin; clarithromycin; adhesive tape; tramadol; codeine Provider's Orders Vascular - ***URGENT*** Send to Dr. Gwenlyn Found for a Consult for abnormal TBI's of both feet Hand Signature: Date(s): Electronic Signature(s) Signed: 02/09/2022 4:20:55 PM By: Kalman Shan DO Entered By: Kalman Shan on 02/09/2022 15:01:39 -------------------------------------------------------------------------------- Problem List Details Patient Name: Date of Service: [name] 02/09/2022 1:45 PM Medical Record Number: [id number] Patient Account Number: [id number] Date of Birth/Sex: Treating RN: 1935-11-09 (86 y.o. F) Primary Care Provider: Alain Honey Other Clinician: Referring Provider: Treating Provider/Extender: Cathleen Fears in Treatment: 0 Active Problems ICD-10 Encounter Code Description Active Date MDM Diagnosis E11.621 Type 2 diabetes  mellitus with foot ulcer 02/09/2022 No Yes L97.522 Non-pressure chronic ulcer of other part of left foot with fat layer exposed 02/09/2022 No Yes [name], [name] ([id number]) [id number] Page 7 of 12 L97.528 Non-pressure chronic ulcer of other part of left foot with other specified 02/09/2022 No Yes severity I73.9 Peripheral vascular disease, unspecified 02/09/2022 No Yes M79.672 Pain in left foot 02/09/2022 No Yes Inactive Problems Resolved Problems Electronic Signature(s) Signed: 02/09/2022 4:20:55 PM By: Kalman Shan DO Entered By: Kalman Shan on 02/09/2022 15:07:03 -------------------------------------------------------------------------------- Progress Note Details Patient Name: Date of Service: [name] 02/09/2022 1:45 PM Medical Record Number: [id number] Patient Account Number: [id number] Date of Birth/Sex: Treating RN: October 28, 1935 (86 y.o. F) Primary Care Provider: Alain Honey Other Clinician: Referring Provider: Treating Provider/Extender: Cathleen Fears in Treatment: 0 Subjective Chief Complaint Information obtained from Patient 02/09/2022; left foot wounds History of Present Illness (HPI) Admission 02/09/2022 Ms. Lucresia Simic is an 86 year old female with a past medical history of uncontrolled insulin-dependent type 2 diabetes with most recent hemoglobin E0C of 13 and complicated by peripheral neuropathy, COPD, and CVA that presents to the clinic for a 1 to 44-monthhistory of nonhealing ulcers to the left foot. She has visited her primary care physician for this issue on 02/01/2022 and was started on Augmentin for left lower extremity cellulitis and wound infection. Caregivers present in the room. Per patient and caregiver her left lower extremity cellulitis has improved. She still has some slight erythema to the dorsal aspect of the foot. She reports pain to the wound sites. She  had an ABI with TBI's  done on 01/11/2022 that showed a TBI of 0.15 on the left and monophasic PTA and DP. She also had an x-ray of the left foot on 01/10/2022 that showed soft tissue swelling with no acute bony abnormality. She currently denies systemic signs of infection. Patient History Information obtained from Patient, Caregiver, Chart. Allergies Biaxin, clarithromycin, adhesive tape, tramadol, codeine Family History Unknown History. Social History Former smoker, Marital Status - Single, Alcohol Use - Rarely, Drug Use - No History, Caffeine Use - Rarely. Medical History Eyes Patient has history of Glaucoma Respiratory Patient has history of Chronic Obstructive Pulmonary Disease (COPD) Cardiovascular Patient has history of Hypertension Endocrine [name], [name] ([id number]) [id number] Page 8 of 12 Patient has history of Type II Diabetes Musculoskeletal Patient has history of Osteoarthritis Neurologic Patient has history of Dementia Hospitalization/Surgery History - pacemaker implant. - cholecystectomy. - appendectomy. - ulnar release. - total abdominal hysterectomy. Medical A Surgical History Notes nd Hematologic/Lymphatic thrombocytopenia Cardiovascular hyperlipidemia, complete heart block Gastrointestinal gerd, fatty liver disease Genitourinary UTI's Musculoskeletal seborrheic dermatitis, scoliosis Neurologic vertigo, spinal stenosis, hx of CVA Review of Systems (ROS) Constitutional Symptoms (General Health) Denies complaints or symptoms of Fatigue, Fever, Chills, Marked Weight Change. Ear/Nose/Mouth/Throat Denies complaints or symptoms of Chronic sinus problems or rhinitis. Integumentary (Skin) Complains or has symptoms of Wounds. Psychiatric Denies complaints or symptoms of Claustrophobia. Objective Constitutional respirations regular, non-labored and within target range for patient.. Vitals Time Taken: 2:02 PM, Temperature: 98.7 F, Pulse: 74 bpm,  Respiratory Rate: 17 breaths/min, Blood Pressure: 147/74 mmHg, Capillary Blood Glucose: 200 mg/dl. Psychiatric pleasant and cooperative. General Notes: Left foot: T the lateral aspect there are 2 wounds with necrotic tissue and nonviable tissue throughout. T the heel and great toe there are open o o wounds with granulation tissue throughout. Slight increase in erythema and warmth to the dorsal aspect of the foot. Weak dorsalis pedal pulses On Doppler Integumentary (Hair, Skin) Wound #1 status is Open. Original cause of wound was Gradually Appeared. The date acquired was: 01/10/2022. The wound is located on the [place]. The wound measures 3.5cm length x 3.5cm width x 0.3cm depth; 9.621cm^2 area and 2.886cm^3 volume. There is Fat Layer (Subcutaneous Tissue) exposed. There is no tunneling or undermining noted. There is a medium amount of serosanguineous drainage noted. The wound margin is distinct with the outline attached to the wound base. There is no granulation within the wound bed. There is a large (67-100%) amount of necrotic tissue within the wound bed including Eschar and Adherent Slough. The periwound skin appearance did not exhibit: Callus, Crepitus, Excoriation, Induration, Rash, Scarring, Dry/Scaly, Maceration, Atrophie Blanche, Cyanosis, Ecchymosis, Hemosiderin Staining, Mottled, Pallor, Rubor, Erythema. Periwound temperature was noted as No Abnormality. The periwound has tenderness on palpation. Wound #2 status is Open. Original cause of wound was Gradually Appeared. The date acquired was: 01/10/2022. The wound is located on the Left,Medial Calcaneus. The wound measures 0.3cm length x 0.3cm width x 0.2cm depth; 0.071cm^2 area and 0.014cm^3 volume. There is Fat Layer (Subcutaneous Tissue) exposed. There is no tunneling or undermining noted. There is a medium amount of serosanguineous drainage noted. The wound margin is distinct with the outline attached to the wound base.  There is no granulation within the wound bed. There is a large (67-100%) amount of necrotic tissue within the wound bed including Adherent Slough. The periwound skin appearance exhibited: Dry/Scaly. The periwound skin appearance did not exhibit: Callus, Crepitus, Excoriation, Induration, Rash, Scarring, Maceration, Atrophie Blanche, Cyanosis, Ecchymosis,  Hemosiderin Staining, Mottled, Pallor, Rubor, Erythema. Periwound temperature was noted as No Abnormality. The periwound has tenderness on palpation. Wound #3 status is Open. Original cause of wound was Gradually Appeared. The date acquired was: 01/10/2022. The wound is located on the Left T Great. The oe wound measures 1cm length x 1cm width x 0.2cm depth; 0.785cm^2 area and 0.157cm^3 volume. There is Fat Layer (Subcutaneous Tissue) exposed. There is no tunneling or undermining noted. There is a medium amount of serosanguineous drainage noted. The wound margin is distinct with the outline attached to the wound base. There is medium (34-66%) red, pink granulation within the wound bed. There is a medium (34-66%) amount of necrotic tissue within the wound bed including Adherent Slough. The periwound skin appearance did not exhibit: Callus, Crepitus, Excoriation, Induration, Rash, Scarring, Dry/Scaly, Maceration, Atrophie Blanche, Cyanosis, Ecchymosis, Hemosiderin Staining, Mottled, Pallor, Rubor, Erythema. Periwound temperature was noted as No Abnormality. The periwound has tenderness on palpation. Assessment Active Problems ICD-10 Type 2 diabetes mellitus with foot ulcer Non-pressure chronic ulcer of other part of left foot with fat layer exposed [name], [name] ([id number]) [id number] Page 9 of 12 Non-pressure chronic ulcer of other part of left foot with other specified severity Peripheral vascular disease, unspecified Pain in left foot Patient presents with a 5-monthhistory of nonhealing ulcers T the left foot in  the setting of uncontrolled type 2 diabetes And peripheral arterial disease. She o is not sure how these wounds started. Her TBI of 0.15 suggests poor blood flow for wound healing. I recommended consulting Dr. BGwenlyn Foundwith cardiology for further assessment of blood flow status. She still has some increased warmth to the dorsal foot however patient states this has improved over the past week. She states she has a few days left of her antibiotics. I recommended completing her course. She had an x-ray that did not show acute abnormality but due to chronicity and no improvement in wound healing and pain on exam I recommended an MRI. I recommended Medihoney to the wound beds. Follow- up in 1 week. Plan Follow-up Appointments: Return Appointment in 1 week. - w/ Dr. HHeber Carolinaand LAllayne ButcherRm # 9 Anesthetic: (In clinic) Topical Lidocaine 5% applied to wound bed Bathing/ Shower/ Hygiene: May shower with protection but do not get wound dressing(s) wet. Edema Control - Lymphedema / SCD / Other: Elevate legs to the level of the heart or above for 30 minutes daily and/or when sitting, a frequency of: Avoid standing for long periods of time. Off-Loading: Other: - Keep pressure off of Left foot Radiology ordered were: Magnetic Resonance Imaging (MRI) - ***URGENT*** LEFT FOOT r/t possible infection Consults ordered were: Vascular - ***URGENT*** Send to Dr. BGwenlyn Foundfor a Consult for abnormal TBI's of both feet The following medication(s) was prescribed: lidocaine topical 5 % gel gel topical was prescribed at facility WOUND #1: - Foot Wound Laterality: Plantar, Left, Lateral Cleanser: Soap and Water 1 x Per Day/15 Days Discharge Instructions: May shower and wash wound with dial antibacterial soap and water prior to dressing change. Cleanser: Wound Cleanser (DME) (Generic) 1 x Per Day/15 Days Discharge Instructions: Cleanse the wound with wound cleanser prior to applying a clean dressing using gauze sponges, not  tissue or cotton balls. Prim Dressing: MediHoney Gel, tube 1.5 (oz) 1 x Per Day/15 Days ary Discharge Instructions: Apply to wound bed as instructed Secondary Dressing: Optifoam Non-Adhesive Dressing, 4x4 in (DME) (Generic) 1 x Per Day/15 Days Discharge Instructions: Apply over primary dressing as directed. Secondary Dressing:  Woven Gauze Sponge, Non-Sterile 4x4 in (DME) (Generic) 1 x Per Day/15 Days Discharge Instructions: Apply over primary dressing as directed. Secured With: [employer], 4.5x3.1 (in/yd) (DME) (Generic) 1 x Per Day/15 Days Discharge Instructions: Secure with Kerlix as directed. Secured With: 318M Medipore H Soft Cloth Surgical T ape, 4 x 10 (in/yd) (DME) (Generic) 1 x Per Day/15 Days Discharge Instructions: Secure with tape as directed. WOUND #2: - Calcaneus Wound Laterality: Left, Medial Cleanser: Soap and Water 1 x Per Day/15 Days Discharge Instructions: May shower and wash wound with dial antibacterial soap and water prior to dressing change. Cleanser: Wound Cleanser (DME) (Generic) 1 x Per Day/15 Days Discharge Instructions: Cleanse the wound with wound cleanser prior to applying a clean dressing using gauze sponges, not tissue or cotton balls. Prim Dressing: MediHoney Gel, tube 1.5 (oz) 1 x Per Day/15 Days ary Discharge Instructions: Apply to wound bed as instructed Secondary Dressing: Optifoam Non-Adhesive Dressing, 4x4 in (DME) (Generic) 1 x Per Day/15 Days Discharge Instructions: Apply over primary dressing as directed. Secondary Dressing: Woven Gauze Sponge, Non-Sterile 4x4 in (DME) (Generic) 1 x Per Day/15 Days Discharge Instructions: Apply over primary dressing as directed. Secured With: [employer], 4.5x3.1 (in/yd) (DME) (Generic) 1 x Per Day/15 Days Discharge Instructions: Secure with Kerlix as directed. Secured With: 318M Medipore H Soft Cloth Surgical T ape, 4 x 10 (in/yd) (DME) (Generic) 1 x Per Day/15 Days Discharge Instructions: Secure with  tape as directed. Secured With: [employer] Size 5, 10 (yds) (DME) (Generic) 1 x Per Day/15 Days WOUND #3: - T Great Wound Laterality: Left oe Cleanser: Soap and Water 1 x Per Day/15 Days Discharge Instructions: May shower and wash wound with dial antibacterial soap and water prior to dressing change. Cleanser: Wound Cleanser (DME) (Generic) 1 x Per Day/15 Days Discharge Instructions: Cleanse the wound with wound cleanser prior to applying a clean dressing using gauze sponges, not tissue or cotton balls. Prim Dressing: MediHoney Gel, tube 1.5 (oz) 1 x Per Day/15 Days ary Discharge Instructions: Apply to wound bed as instructed Secondary Dressing: Woven Gauze Sponge, Non-Sterile 4x4 in (DME) (Generic) 1 x Per Day/15 Days Discharge Instructions: Apply over primary dressing as directed. Secured With: Child psychotherapist, Sterile 2x75 (in/in) (DME) (Generic) 1 x Per Day/15 Days Discharge Instructions: Secure with stretch gauze as directed. Secured With: 318M Medipore H Soft Cloth Surgical T ape, 4 x 10 (in/yd) (DME) (Generic) 1 x Per Day/15 Days Discharge Instructions: Secure with tape as directed. 1. Medihoney 2. MRI with and without contrast to the left foot 3. Referral to interventional cardiology Dr. Gwenlyn Found 4. Follow-up in 1 week 5. Continue antibiotics per primary [name], [name] ([id number]) [id number] Page 10 of 12 Electronic Signature(s) Signed: 02/09/2022 4:20:55 PM By: Kalman Shan DO Entered By: Kalman Shan on 02/09/2022 15:07:17 -------------------------------------------------------------------------------- HxROS Details Patient Name: Date of Service: [name] 02/09/2022 1:45 PM Medical Record Number: [id number] Patient Account Number: [id number] Date of Birth/Sex: Treating RN: 08-16-1935 (86 y.o. [name], [name] Primary Care Provider: Alain Honey Other Clinician: Referring Provider: Treating  Provider/Extender: Cathleen Fears in Treatment: 0 Information Obtained From Patient Caregiver Chart Constitutional Symptoms (General Health) Complaints and Symptoms: Negative for: Fatigue; Fever; Chills; Marked Weight Change Ear/Nose/Mouth/Throat Complaints and Symptoms: Negative for: Chronic sinus problems or rhinitis Integumentary (Skin) Complaints and Symptoms: Positive for: Wounds Psychiatric Complaints and Symptoms: Negative for: Claustrophobia Eyes Medical History: Positive for: Glaucoma Hematologic/Lymphatic Medical History: Past Medical History  Notes: thrombocytopenia Respiratory Medical History: Positive for: Chronic Obstructive Pulmonary Disease (COPD) Cardiovascular Medical History: Positive for: Hypertension Past Medical History Notes: hyperlipidemia, complete heart block Gastrointestinal Medical History: Past Medical History Notes: gerd, fatty liver disease Endocrine Medical History: Positive for: Type II Diabetes [name], [name] ([id number]) [id number] Page 11 of 12 Genitourinary Medical History: Past Medical History Notes: UTI's Immunological Musculoskeletal Medical History: Positive for: Osteoarthritis Past Medical History Notes: seborrheic dermatitis, scoliosis Neurologic Medical History: Positive for: Dementia Past Medical History Notes: vertigo, spinal stenosis, hx of CVA Oncologic HBO Extended History Items Eyes: Glaucoma Immunizations Pneumococcal Vaccine: Received Pneumococcal Vaccination: Yes Received Pneumococcal Vaccination On or After 60th Birthday: Yes Implantable Devices Yes Hospitalization / Surgery History Type of Hospitalization/Surgery pacemaker implant cholecystectomy appendectomy ulnar release total abdominal hysterectomy Family and Social History Unknown History: Yes; Former smoker; Marital Status - Single; Alcohol Use: Rarely; Drug Use: No History;  Caffeine Use: Rarely; Financial Concerns: No; Food, Clothing or Shelter Needs: No; Support System Lacking: No; Transportation Concerns: No Electronic Signature(s) Signed: 02/09/2022 3:46:02 PM By: Rhae Hammock RN Signed: 02/09/2022 4:20:55 PM By: Kalman Shan DO Entered By: Rhae Hammock on 02/09/2022 14:09:49 -------------------------------------------------------------------------------- SuperBill Details Patient Name: Date of Service: [name] 02/09/2022 Medical Record Number: [id number] Patient Account Number: [id number] Date of Birth/Sex: Treating RN: 1935-11-28 (86 y.o. F) Primary Care Provider: Alain Honey Other Clinician: Referring Provider: Treating Provider/Extender: Cathleen Fears in Treatment: 0 Diagnosis Coding DAFNEY, FARLER ([id number]) [id number] Page 12 of 12 ICD-10 Codes Code Description E11.621 Type 2 diabetes mellitus with foot ulcer L97.522 Non-pressure chronic ulcer of other part of left foot with fat layer exposed L97.528 Non-pressure chronic ulcer of other part of left foot with other specified severity I73.9 Peripheral vascular disease, unspecified M79.672 Pain in left foot Facility Procedures : CPT4 Code: [id number] 9 Description: 9215 - WOUND CARE VISIT-LEV 5 EST PT Modifier: Quantity: 1 Physician Procedures : CPT4 Code Description Modifier [id number] 59977 - WC PHYS LEVEL 4 - NEW PT ICD-10 Diagnosis Description E11.621 Type 2 diabetes mellitus with foot ulcer L97.522 Non-pressure chronic ulcer of other part of left foot with fat layer exposed L97.528  Non-pressure chronic ulcer of other part of left foot with other specified severity M79.672 Pain in left foot Quantity: 1 Electronic Signature(s) Signed: 02/09/2022 3:46:02 PM By: Rhae Hammock RN Signed: 02/09/2022 4:20:55 PM By: Kalman Shan DO Entered By: Rhae Hammock on 02/09/2022 15:45:39

## 2022-02-11 ENCOUNTER — Ambulatory Visit (HOSPITAL_COMMUNITY): Payer: Medicare Other

## 2022-02-11 ENCOUNTER — Encounter (HOSPITAL_COMMUNITY): Payer: Self-pay

## 2022-02-14 ENCOUNTER — Ambulatory Visit (HOSPITAL_BASED_OUTPATIENT_CLINIC_OR_DEPARTMENT_OTHER): Payer: Medicare Other | Admitting: General Surgery

## 2022-02-14 ENCOUNTER — Ambulatory Visit (INDEPENDENT_AMBULATORY_CARE_PROVIDER_SITE_OTHER): Payer: Medicare Other | Admitting: Podiatry

## 2022-02-14 ENCOUNTER — Ambulatory Visit: Payer: Self-pay

## 2022-02-14 DIAGNOSIS — Z91199 Patient's noncompliance with other medical treatment and regimen due to unspecified reason: Secondary | ICD-10-CM

## 2022-02-14 NOTE — Patient Outreach (Signed)
  Care Coordination   02/14/2022 Name: [name] MRN: [id number] DOB: 1936/03/22   Care Coordination Outreach Attempts:  An unsuccessful telephone outreach was attempted for a scheduled appointment today.  Follow Up Plan:  Additional outreach attempts will be made to offer the patient care coordination information and services.   Encounter Outcome:  Pt. Request to Call Back  Care Coordination Interventions Activated:  No   Care Coordination Interventions:  No, not indicated    Barb Merino, RN, BSN, CCM Care Management Coordinator [place] [facility] Direct Phone: [phone]

## 2022-02-16 ENCOUNTER — Ambulatory Visit: Payer: Self-pay

## 2022-02-16 ENCOUNTER — Encounter (HOSPITAL_BASED_OUTPATIENT_CLINIC_OR_DEPARTMENT_OTHER): Payer: Medicare Other | Admitting: Internal Medicine

## 2022-02-16 DIAGNOSIS — I739 Peripheral vascular disease, unspecified: Secondary | ICD-10-CM | POA: Diagnosis not present

## 2022-02-16 DIAGNOSIS — L97528 Non-pressure chronic ulcer of other part of left foot with other specified severity: Secondary | ICD-10-CM

## 2022-02-16 DIAGNOSIS — L97522 Non-pressure chronic ulcer of other part of left foot with fat layer exposed: Secondary | ICD-10-CM | POA: Diagnosis not present

## 2022-02-16 DIAGNOSIS — E11621 Type 2 diabetes mellitus with foot ulcer: Secondary | ICD-10-CM

## 2022-02-16 DIAGNOSIS — E1142 Type 2 diabetes mellitus with diabetic polyneuropathy: Secondary | ICD-10-CM | POA: Diagnosis not present

## 2022-02-16 DIAGNOSIS — M79672 Pain in left foot: Secondary | ICD-10-CM | POA: Diagnosis not present

## 2022-02-16 DIAGNOSIS — E1165 Type 2 diabetes mellitus with hyperglycemia: Secondary | ICD-10-CM | POA: Diagnosis not present

## 2022-02-16 NOTE — Patient Instructions (Signed)
Visit Information  Thank you for taking time to visit with me today. Please don't hesitate to contact me if I can be of assistance to you.   Following are the goals we discussed today:   Goals Addressed               This Visit's Progress     Patient Stated     I would like to get my sugars down so my wounds will heal (pt-stated)        Care Coordination Interventions: Provided education to patient about basic DM disease process Reviewed medications with patient and discussed importance of medication adherence Counseled on importance of regular laboratory monitoring as prescribed Provided patient with written educational materials related to hypo and hyperglycemia and importance of correct treatment Advised patient, providing education and rationale, to check cbg daily before breakfast and lunch and at bedtime and record, calling PCP for findings outside established parameters Review of patient status, including review of consultants reports, relevant laboratory and other test results, and medications completed Educated patient regarding protein shakes to help with wound healing, instructed patient to switch Ensure for Glucerna  Educated patient regarding the dangers in skipping meals, encouraged the use of meal planning using the plate method and portion control Educated patient about target daily glucose range; FBS 80-130, <180 after meals  Mailed printed educational materials related to diabetes management           Our next appointment is by telephone on 03/16/22 at 1:00 PM  Please call the care guide team at [phone] if you need to cancel or reschedule your appointment.   If you are experiencing a Mental Health or [place] or need someone to talk to, please call 1-800-273-TALK (toll free, 24 hour hotline) go to [facility] [place] [phone])  The patient verbalized understanding of  instructions, educational materials, and care plan provided today and agreed to receive a mailed copy of patient instructions, educational materials, and care plan.   Barb Merino, RN, BSN, CCM Care Management Coordinator [place] [facility] Direct Phone: [phone]

## 2022-02-16 NOTE — Patient Outreach (Signed)
  Care Coordination   Follow Up Visit Note   02/16/2022 Name: [name] MRN: [id number] DOB: 11-May-1935  [name] is a 86 y.o. year old female who sees Wardell Honour, MD for primary care. I spoke with  [name] by phone today.  What matters to the patients health and wellness today?  For foot wound to heal    Goals Addressed             This Visit's Progress    COMPLETED: Care Coordination Activities       Care Coordination Interventions: Discussed the patient is doing well in the home with her caregiver Reviewed upcomming appointments - patient endorses knowledge of wound clinic appointment today and will attend Reviewed future appointment with [name] with the patient Instructed the patient to contact SW as needed         SDOH assessments and interventions completed:  No     Care Coordination Interventions Activated:  Yes  Care Coordination Interventions:  Yes, provided   Follow up plan: No further intervention required.   Encounter Outcome:  Pt. Visit Completed   Daneen Schick, BSW, CDP Social Worker, Certified Dementia Practitioner [facility]  Care Coordination [phone]

## 2022-02-16 NOTE — Patient Instructions (Signed)
Visit Information  Thank you for taking time to visit with me today. Please don't hesitate to contact me if I can be of assistance to you.   Following are the goals we discussed today:   Goals Addressed             This Visit's Progress    COMPLETED: Care Coordination Activities       Care Coordination Interventions: Discussed the patient is doing well in the home with her caregiver Reviewed upcomming appointments - patient endorses knowledge of wound clinic appointment today and will attend Reviewed future appointment with [place] with the patient Instructed the patient to contact SW as needed         If you are experiencing a Mental Health or [facility] or need someone to talk to, please call 1-800-273-TALK (toll free, 24 hour hotline)  The patient verbalized understanding of instructions, educational materials, and care plan provided today and DECLINED offer to receive copy of patient instructions, educational materials, and care plan.   No further follow up required: Please contact your primary care provider as needed.  Daneen Schick, BSW, CDP Social Worker, Certified Dementia Practitioner [facility] [place] [facility]  Care Coordination [phone]

## 2022-02-16 NOTE — Patient Outreach (Signed)
  Care Coordination   Initial Visit Note   02/16/2022 Name: [name] MRN: [id number] DOB: [birth year]  [name] is a 86 y.o. year old female who sees Wardell Honour, MD for primary care. I spoke with  [name] by phone today.  What matters to the patients health and wellness today?  Patient wants to lower her blood sugars and have her foot wounds heal.     Goals Addressed               This Visit's Progress     Patient Stated     I would like to get my sugars down so my wounds will heal (pt-stated)        Care Coordination Interventions: Provided education to patient about basic DM disease process Reviewed medications with patient and discussed importance of medication adherence Counseled on importance of regular laboratory monitoring as prescribed Provided patient with written educational materials related to hypo and hyperglycemia and importance of correct treatment Advised patient, providing education and rationale, to check cbg daily before breakfast and lunch and at bedtime and record, calling PCP for findings outside established parameters Review of patient status, including review of consultants reports, relevant laboratory and other test results, and medications completed Educated patient regarding protein shakes to help with wound healing, instructed patient to switch Ensure for Glucerna  Educated patient regarding the dangers in skipping meals, encouraged the use of meal planning using the plate method and portion control Educated patient about target daily glucose range; FBS 80-130, <180 after meals  Mailed printed educational materials related to diabetes management           SDOH assessments and interventions completed:  No     Care Coordination Interventions Activated:  Yes  Care Coordination Interventions:  Yes, provided   Follow up plan: Follow up call scheduled for 03/16/22 @1 :00 PM     Encounter Outcome:  Pt. Visit Completed

## 2022-02-17 ENCOUNTER — Ambulatory Visit (HOSPITAL_COMMUNITY): Admission: RE | Admit: 2022-02-17 | Payer: Medicare Other | Source: Ambulatory Visit

## 2022-02-19 NOTE — Progress Notes (Signed)
No show for appointment. History of memory loss.

## 2022-02-20 ENCOUNTER — Other Ambulatory Visit (HOSPITAL_COMMUNITY): Payer: Self-pay | Admitting: Internal Medicine

## 2022-02-20 DIAGNOSIS — E11621 Type 2 diabetes mellitus with foot ulcer: Secondary | ICD-10-CM

## 2022-02-20 DIAGNOSIS — L97521 Non-pressure chronic ulcer of other part of left foot limited to breakdown of skin: Secondary | ICD-10-CM

## 2022-02-21 ENCOUNTER — Ambulatory Visit (HOSPITAL_COMMUNITY): Admission: RE | Admit: 2022-02-21 | Payer: Medicare Other | Source: Ambulatory Visit

## 2022-02-23 ENCOUNTER — Encounter (HOSPITAL_BASED_OUTPATIENT_CLINIC_OR_DEPARTMENT_OTHER): Payer: Medicare Other | Admitting: Internal Medicine

## 2022-02-24 ENCOUNTER — Ambulatory Visit (HOSPITAL_COMMUNITY)
Admission: RE | Admit: 2022-02-24 | Discharge: 2022-02-24 | Disposition: A | Discharge status: Home / Self Care | Payer: Medicare Other | Source: Ambulatory Visit | Attending: Internal Medicine | Admitting: Internal Medicine

## 2022-02-24 ENCOUNTER — Other Ambulatory Visit (HOSPITAL_COMMUNITY): Payer: Self-pay | Admitting: Internal Medicine

## 2022-02-24 DIAGNOSIS — M869 Osteomyelitis, unspecified: Secondary | ICD-10-CM | POA: Diagnosis not present

## 2022-02-24 DIAGNOSIS — L97521 Non-pressure chronic ulcer of other part of left foot limited to breakdown of skin: Secondary | ICD-10-CM

## 2022-02-24 DIAGNOSIS — E1169 Type 2 diabetes mellitus with other specified complication: Secondary | ICD-10-CM | POA: Diagnosis not present

## 2022-02-24 DIAGNOSIS — E11621 Type 2 diabetes mellitus with foot ulcer: Secondary | ICD-10-CM

## 2022-02-24 DIAGNOSIS — M79672 Pain in left foot: Secondary | ICD-10-CM | POA: Diagnosis not present

## 2022-02-24 DIAGNOSIS — L97509 Non-pressure chronic ulcer of other part of unspecified foot with unspecified severity: Secondary | ICD-10-CM | POA: Insufficient documentation

## 2022-02-24 MED ORDER — SODIUM CHLORIDE (PF) 0.9 % IJ SOLN
INTRAMUSCULAR | Status: AC
  Filled 2022-02-24: qty 50

## 2022-02-24 NOTE — Progress Notes (Addendum)
[name] ([id number]) [id number] Page 1 of 10 Visit Report for 02/16/2022 Arrival Information Details Patient Name: Date of Service: [name], [name] 02/16/2022 3:15 PM Medical Record Number: [id number] Patient Account Number: [id number] Date of Birth/Sex: Treating RN: 03-06-1936 (86 y.o. F) Primary Care Annie Saephan: Alain Honey Other Clinician: Referring Neeka Urista: Treating Uthman Mroczkowski/Extender: Cathleen Fears in Treatment: 1 Visit Information History Since Last Visit All ordered tests and consults were completed: No Patient Arrived: Wheel Chair Added or deleted any medications: No Arrival Time: 15:04 Any new allergies or adverse reactions: No Accompanied By: Caregiver Had a fall or experienced change in No Transfer Assistance: None activities of daily living that may affect Patient Requires Transmission-Based Precautions: No risk of falls: Patient Has Alerts: Yes Signs or symptoms of abuse/neglect since last visito No Patient Alerts: Patient on Blood Thinner Hospitalized since last visit: No Left Abi: 1.1 TBI: 0.1 Implantable device outside of the clinic excluding No cellular tissue based products placed in the center since last visit: Pain Present Now: No Electronic Signature(s) Signed: 02/24/2022 11:35:40 AM By: Worthy Rancher Entered By: Worthy Rancher on 02/16/2022 15:05:44 -------------------------------------------------------------------------------- Encounter Discharge Information Details Patient Name: Date of Service: [name] 02/16/2022 3:15 PM Medical Record Number: [id number] Patient Account Number: [id number] Date of Birth/Sex: Treating RN: Jan 11, 1936 (86 y.o. [name], [name] Primary Care Dickie Labarre: Alain Honey Other Clinician: Referring Bhumi Godbey: Treating Alainna Stawicki/Extender: Cathleen Fears in Treatment: 1 Encounter Discharge Information Items Discharge  Condition: Stable Ambulatory Status: Wheelchair Discharge Destination: Home Transportation: Private Auto Accompanied By: aide Schedule Follow-up Appointment: Yes Clinical Summary of Care: Patient Declined Electronic Signature(s) Signed: 02/16/2022 4:29:25 PM By: Rhae Hammock RN Entered By: Rhae Hammock on 02/16/2022 15:28:18 [name] ([id number]) [id number] Page 2 of 10 -------------------------------------------------------------------------------- Lower Extremity Assessment Details Patient Name: Date of Service: [name], [name] 02/16/2022 3:15 PM Medical Record Number: [id number] Patient Account Number: [id number] Date of Birth/Sex: Treating RN: 06-Feb-1936 (86 y.o. [name], [name] Primary Care Katricia Prehn: Alain Honey Other Clinician: Referring Yvonda Fouty: Treating Climmie Cronce/Extender: Cathleen Fears in Treatment: 1 Edema Assessment Assessed: Shirlyn Goltz: Yes] Patrice Paradise: No] Edema: [Left: N] [Right: o] Calf Left: Right: Point of Measurement: 32 cm From Medial Instep 32 cm Ankle Left: Right: Point of Measurement: 8 cm From Medial Instep 22 cm Vascular Assessment Pulses: Dorsalis Pedis Palpable: [Left:Yes] Posterior Tibial Palpable: [Left:Yes] Electronic Signature(s) Signed: 02/16/2022 4:29:25 PM By: Rhae Hammock RN Entered By: Rhae Hammock on 02/16/2022 15:25:16 -------------------------------------------------------------------------------- Multi Wound Chart Details Patient Name: Date of Service: [name] 02/16/2022 3:15 PM Medical Record Number: [id number] Patient Account Number: [id number] Date of Birth/Sex: Treating RN: 09/08/1935 (86 y.o. F) Primary Care Johara Lodwick: Alain Honey Other Clinician: Referring Donney Caraveo: Treating Masaye Gatchalian/Extender: Cathleen Fears in Treatment: 1 Vital Signs Height(in): Pulse(bpm): 101 Weight(lbs): Blood  Pressure(mmHg): 156/81 Body Mass Index(BMI): Temperature(F): 97.4 Respiratory Rate(breaths/min): 20 [1:Photos:] [3:[id number] Page 3 of 10] Left, Lateral, Plantar Foot Left, Medial Calcaneus Left T Great oe Wound Location: Gradually Appeared Gradually Appeared Gradually Appeared Wounding Event: Diabetic Wound/Ulcer of the Lower Diabetic Wound/Ulcer of the Lower Diabetic Wound/Ulcer of the Lower Primary Etiology: Extremity Extremity Extremity Glaucoma, Chronic Obstructive Glaucoma, Chronic Obstructive Glaucoma, Chronic Obstructive Comorbid History: Pulmonary Disease (COPD), Pulmonary Disease (COPD), Pulmonary Disease (COPD), Hypertension, Type II Diabetes, Hypertension, Type II Diabetes, Hypertension, Type II Diabetes, Osteoarthritis, Dementia Osteoarthritis, Dementia Osteoarthritis, Dementia 01/10/2022 01/10/2022 01/10/2022 Date Acquired: 1 1 1  Weeks  of Treatment: Open Healed - Epithelialized Open Wound Status: No No No Wound Recurrence: Yes No No Clustered Wound: 2 N/A N/A Clustered Quantity: Yes Yes Yes Pending A mputation on Presentation: 3x2.5x0.3 0x0x0 0x0x0 Measurements L x W x D (cm) 5.89 0 0 A (cm) : rea 1.767 0 0 Volume (cm) : 38.80% 100.00% 100.00% % Reduction in A rea: 38.80% 100.00% 100.00% % Reduction in Volume: Grade 2 Grade 2 Grade 2 Classification: Medium Medium Medium Exudate A mount: Serosanguineous Serosanguineous Serosanguineous Exudate Type: red, brown red, brown red, brown Exudate Color: Distinct, outline attached Distinct, outline attached Distinct, outline attached Wound Margin: None Present (0%) None Present (0%) Medium (34-66%) Granulation A mount: N/A N/A Red, Pink Granulation Quality: Large (67-100%) Large (67-100%) Medium (34-66%) Necrotic A mount: Eschar N/A N/A Necrotic Tissue: Fat Layer (Subcutaneous Tissue): Yes Fat Layer (Subcutaneous Tissue): Yes Fat Layer (Subcutaneous Tissue): Yes Exposed  Structures: Fascia: No Fascia: No Fascia: No Tendon: No Tendon: No Tendon: No Muscle: No Muscle: No Muscle: No Joint: No Joint: No Joint: No Bone: No Bone: No Bone: No None None None Epithelialization: Excoriation: No Excoriation: No Excoriation: No Periwound Skin Texture: Induration: No Induration: No Induration: No Callus: No Callus: No Callus: No Crepitus: No Crepitus: No Crepitus: No Rash: No Rash: No Rash: No Scarring: No Scarring: No Scarring: No Maceration: No Dry/Scaly: Yes Maceration: No Periwound Skin Moisture: Dry/Scaly: No Maceration: No Dry/Scaly: No Atrophie Blanche: No Atrophie Blanche: No Atrophie Blanche: No Periwound Skin Color: Cyanosis: No Cyanosis: No Cyanosis: No Ecchymosis: No Ecchymosis: No Ecchymosis: No Erythema: No Erythema: No Erythema: No Hemosiderin Staining: No Hemosiderin Staining: No Hemosiderin Staining: No Mottled: No Mottled: No Mottled: No Pallor: No Pallor: No Pallor: No Rubor: No Rubor: No Rubor: No No Abnormality No Abnormality No Abnormality Temperature: Yes Yes Yes Tenderness on Palpation: Treatment Notes Wound #1 (Foot) Wound Laterality: Plantar, Left, Lateral Cleanser Soap and Water Discharge Instruction: May shower and wash wound with dial antibacterial soap and water prior to dressing change. Wound Cleanser Discharge Instruction: Cleanse the wound with wound cleanser prior to applying a clean dressing using gauze sponges, not tissue or cotton balls. Peri-Wound Care Topical Primary Dressing MediHoney Gel, tube 1.5 (oz) Discharge Instruction: Apply to wound bed as instructed Secondary Dressing Optifoam Non-Adhesive Dressing, 4x4 in Discharge Instruction: Apply over primary dressing as directed. Woven Gauze Sponge, Non-Sterile 4x4 in Discharge Instruction: Apply over primary dressing as directed. GISSELLE, GALVIS ([id number]) [id number] Page 4 of 10 Secured  With [employer], 4.5x3.1 (in/yd) Discharge Instruction: Secure with Kerlix as directed. 34M Medipore H Soft Cloth Surgical T ape, 4 x 10 (in/yd) Discharge Instruction: Secure with tape as directed. Compression Wrap Compression Stockings Add-Ons Wound #2 (Calcaneus) Wound Laterality: Left, Medial Cleanser Soap and Water Discharge Instruction: May shower and wash wound with dial antibacterial soap and water prior to dressing change. Wound Cleanser Discharge Instruction: Cleanse the wound with wound cleanser prior to applying a clean dressing using gauze sponges, not tissue or cotton balls. Peri-Wound Care Topical Primary Dressing MediHoney Gel, tube 1.5 (oz) Discharge Instruction: Apply to wound bed as instructed Secondary Dressing Optifoam Non-Adhesive Dressing, 4x4 in Discharge Instruction: Apply over primary dressing as directed. Woven Gauze Sponge, Non-Sterile 4x4 in Discharge Instruction: Apply over primary dressing as directed. Secured With [employer], 4.5x3.1 (in/yd) Discharge Instruction: Secure with Kerlix as directed. 34M Medipore H Soft Cloth Surgical T ape, 4 x 10 (in/yd) Discharge Instruction: Secure with tape as directed. Stretch Net Size 5,  10 (yds) Compression Wrap Compression Stockings Add-Ons Wound #3 (Toe Great) Wound Laterality: Left Cleanser Soap and Water Discharge Instruction: May shower and wash wound with dial antibacterial soap and water prior to dressing change. Wound Cleanser Discharge Instruction: Cleanse the wound with wound cleanser prior to applying a clean dressing using gauze sponges, not tissue or cotton balls. Peri-Wound Care Topical Primary Dressing MediHoney Gel, tube 1.5 (oz) Discharge Instruction: Apply to wound bed as instructed Secondary Dressing Woven Gauze Sponge, Non-Sterile 4x4 in Discharge Instruction: Apply over primary dressing as directed. Secured With [employer] Stretch Gauze Bandage, Sterile 2x75  (in/in) Discharge Instruction: Secure with stretch gauze as directed. 40M Medipore H Soft Cloth Surgical T ape, 4 x 10 (in/yd) Discharge Instruction: Secure with tape as directed. Compression MONAY, HOULTON ([id number]) [id number] Page 5 of 10 Compression Stockings Add-Ons Electronic Signature(s) Signed: 02/16/2022 4:46:31 PM By: Kalman Shan DO Entered By: Kalman Shan on 02/16/2022 16:40:14 -------------------------------------------------------------------------------- Multi-Disciplinary Care Plan Details Patient Name: Date of Service: [name] 02/16/2022 3:15 PM Medical Record Number: [id number] Patient Account Number: [id number] Date of Birth/Sex: Treating RN: [birth year] (86 y.o. [name], [name] Primary Care Ruthann Angulo: Alain Honey Other Clinician: Referring Minsa Weddington: Treating Yarieliz Wasser/Extender: Cathleen Fears in Treatment: 1 Active Inactive Electronic Signature(s) Signed: 04/13/2022 4:44:20 PM By: Deon Pilling RN, BSN Signed: 05/10/2022 5:36:43 PM By: Rhae Hammock RN Previous Signature: 02/16/2022 4:29:25 PM Version By: Rhae Hammock RN Entered By: Deon Pilling on 04/13/2022 16:44:19 -------------------------------------------------------------------------------- Pain Assessment Details Patient Name: Date of Service: MAREA, REASNER 02/16/2022 3:15 PM Medical Record Number: [id number] Patient Account Number: [id number] Date of Birth/Sex: Treating RN: 06-09-1935 (86 y.o. [name], [name] Primary Care Neeti Knudtson: Alain Honey Other Clinician: Referring Osceola Holian: Treating Angad Nabers/Extender: Cathleen Fears in Treatment: 1 Active Problems Location of Pain Severity and Description of Pain Patient Has Paino No Site Locations ASHIMA, SHRAKE ([id number]) [phone].pdf Page 6 of 10 Pain Management and Medication Current Pain  Management: Electronic Signature(s) Signed: 02/16/2022 4:29:25 PM By: Rhae Hammock RN Entered By: Rhae Hammock on 02/16/2022 15:18:07 -------------------------------------------------------------------------------- Patient/Caregiver Education Details Patient Name: Date of Service: [name] 10/12/2023andnbsp3:15 PM Medical Record Number: [id number] Patient Account Number: [id number] Date of Birth/Gender: Treating RN: [birth year] (86 y.o. [name], [name] Primary Care Physician: Alain Honey Other Clinician: Referring Physician: Treating Physician/Extender: Cathleen Fears in Treatment: 1 Education Assessment Education Provided To: Patient Education Topics Provided Wound/Skin Impairment: Methods: Explain/Verbal Responses: Reinforcements needed, State content correctly Electronic Signature(s) Signed: 02/16/2022 4:29:25 PM By: Rhae Hammock RN Entered By: Rhae Hammock on 02/16/2022 15:25:55 -------------------------------------------------------------------------------- Wound Assessment Details Patient Name: Date of Service: Lamonte Sakai TAYSIA, RIVERE 02/16/2022 3:15 PM Medical Record Number: [id number] Patient Account Number: [id number] Date of Birth/Sex: Treating RN: [birth year] (86 y.o. F) Primary Care Annelyse Rey: Alain Honey Other Clinician: Referring Taneah Masri: Treating Jaely Silman/Extender: Cathleen Fears in Treatment: 1 Wound Status Wound Number: 1 Primary Diabetic Wound/Ulcer of the Lower Extremity Etiology: Wound Location: Left, Lateral, Plantar Foot Wound Open Wounding Event: Gradually Appeared Status: Date Acquired: 01/10/2022 Comorbid Glaucoma, Chronic Obstructive Pulmonary Disease (COPD), Weeks Of Treatment: 1 History: Hypertension, Type II Diabetes, Osteoarthritis, Dementia Clustered Wound: Yes Pending Amputation On Presentation Photos ZANYIAH, POSTEN ([id number])  [id number] Page 7 of 10 Wound Measurements Length: (cm) 3 Width: (cm) 2.5 Depth: (cm) 0.3 Clustered Quantity: 2 Area: (cm) 5.89 Volume: (cm) 1.767 % Reduction in Area: 38.8% % Reduction in  Volume: 38.8% Epithelialization: None Wound Description Classification: Grade 2 Wound Margin: Distinct, outline attached Exudate Amount: Medium Exudate Type: Serosanguineous Exudate Color: red, brown Foul Odor After Cleansing: No Slough/Fibrino Yes Wound Bed Granulation Amount: None Present (0%) Exposed Structure Necrotic Amount: Large (67-100%) Fascia Exposed: No Necrotic Quality: Eschar Fat Layer (Subcutaneous Tissue) Exposed: Yes Tendon Exposed: No Muscle Exposed: No Joint Exposed: No Bone Exposed: No Periwound Skin Texture Texture Color No Abnormalities Noted: No No Abnormalities Noted: No Callus: No Atrophie Blanche: No Crepitus: No Cyanosis: No Excoriation: No Ecchymosis: No Induration: No Erythema: No Rash: No Hemosiderin Staining: No Scarring: No Mottled: No Pallor: No Moisture Rubor: No No Abnormalities Noted: No Dry / Scaly: No Temperature / Pain Maceration: No Temperature: No Abnormality Tenderness on Palpation: Yes Electronic Signature(s) Signed: 02/24/2022 11:35:40 AM By: Worthy Rancher Entered By: Worthy Rancher on 02/16/2022 15:47:33 -------------------------------------------------------------------------------- Wound Assessment Details Patient Name: Date of Service: GUADALUPE, KEREKES 02/16/2022 3:15 PM Medical Record Number: [id number] Patient Account Number: [id number] Date of Birth/Sex: Treating RN: 03-Mar-1936 (86 y.o. F) Primary Care Cranston Koors: Alain Honey Other Clinician: Referring Dejan Angert: Treating Allin Frix/Extender: Cathleen Fears in Treatment: 1 Wound Status ZEPHYR, SAUSEDO ([id number]) [id number] Page 8 of 10 Wound Number: 2 Primary Diabetic Wound/Ulcer of  the Lower Extremity Etiology: Wound Location: Left, Medial Calcaneus Wound Healed - Epithelialized Wounding Event: Gradually Appeared Status: Date Acquired: 01/10/2022 Comorbid Glaucoma, Chronic Obstructive Pulmonary Disease (COPD), Weeks Of Treatment: 1 History: Hypertension, Type II Diabetes, Osteoarthritis, Dementia Clustered Wound: No Pending Amputation On Presentation Photos Wound Measurements Length: (cm) Width: (cm) Depth: (cm) Area: (cm) Volume: (cm) 0 % Reduction in Area: 100% 0 % Reduction in Volume: 100% 0 Epithelialization: None 0 0 Wound Description Classification: Grade 2 Wound Margin: Distinct, outline attached Exudate Amount: Medium Exudate Type: Serosanguineous Exudate Color: red, brown Foul Odor After Cleansing: No Slough/Fibrino Yes Wound Bed Granulation Amount: None Present (0%) Exposed Structure Necrotic Amount: Large (67-100%) Fascia Exposed: No Fat Layer (Subcutaneous Tissue) Exposed: Yes Tendon Exposed: No Muscle Exposed: No Joint Exposed: No Bone Exposed: No Periwound Skin Texture Texture Color No Abnormalities Noted: No No Abnormalities Noted: No Callus: No Atrophie Blanche: No Crepitus: No Cyanosis: No Excoriation: No Ecchymosis: No Induration: No Erythema: No Rash: No Hemosiderin Staining: No Scarring: No Mottled: No Pallor: No Moisture Rubor: No No Abnormalities Noted: No Dry / Scaly: Yes Temperature / Pain Maceration: No Temperature: No Abnormality Tenderness on Palpation: Yes Treatment Notes Wound #2 (Calcaneus) Wound Laterality: Left, Medial Cleanser Soap and Water Discharge Instruction: May shower and wash wound with dial antibacterial soap and water prior to dressing change. Wound Cleanser Discharge Instruction: Cleanse the wound with wound cleanser prior to applying a clean dressing using gauze sponges, not tissue or cotton balls. Peri-Wound Care Topical ARYSSA, ROSAMOND ([id number])  [id number] Page 9 of 10 Primary Dressing MediHoney Gel, tube 1.5 (oz) Discharge Instruction: Apply to wound bed as instructed Secondary Dressing Optifoam Non-Adhesive Dressing, 4x4 in Discharge Instruction: Apply over primary dressing as directed. Woven Gauze Sponge, Non-Sterile 4x4 in Discharge Instruction: Apply over primary dressing as directed. Secured With [employer], 4.5x3.1 (in/yd) Discharge Instruction: Secure with Kerlix as directed. 20M Medipore H Soft Cloth Surgical T ape, 4 x 10 (in/yd) Discharge Instruction: Secure with tape as directed. Stretch Net Size 5, 10 (yds) Compression Wrap Compression Stockings Add-Ons Electronic Signature(s) Signed: 02/24/2022 11:35:40 AM By: Worthy Rancher Entered By: Worthy Rancher on 02/16/2022 15:47:53 -------------------------------------------------------------------------------- Wound Assessment Details Patient Name: Date  of Service: CHALET, KERWIN 02/16/2022 3:15 PM Medical Record Number: [id number] Patient Account Number: [id number] Date of Birth/Sex: Treating RN: [birth year] (86 y.o. F) Primary Care Girolamo Lortie: Alain Honey Other Clinician: Referring Lester Platas: Treating Granvil Djordjevic/Extender: Cathleen Fears in Treatment: 1 Wound Status Wound Number: 3 Primary Diabetic Wound/Ulcer of the Lower Extremity Etiology: Wound Location: Left T Great oe Wound Open Wounding Event: Gradually Appeared Status: Date Acquired: 01/10/2022 Comorbid Glaucoma, Chronic Obstructive Pulmonary Disease (COPD), Weeks Of Treatment: 1 History: Hypertension, Type II Diabetes, Osteoarthritis, Dementia Clustered Wound: No Pending Amputation On Presentation Photos Wound Measurements Length: (cm) 0 Width: (cm) 0 Depth: (cm) 0 Area: (cm) 0 Volume: (cm) 0 KEIGHLEY, DECKMAN ([id number]) Wound Description Classification: Grade 2 Wound Margin: Distinct, outline attached Exudate Amount:  Medium Exudate Type: Serosanguineous Exudate Color: red, brown Foul Odor After Cleansing: No Slough/Fibrino Yes % Reduction in Area: 100% % Reduction in Volume: 100% Epithelialization: None [id number] Page 10 of 10 Wound Bed Granulation Amount: Medium (34-66%) Exposed Structure Granulation Quality: Red, Pink Fascia Exposed: No Necrotic Amount: Medium (34-66%) Fat Layer (Subcutaneous Tissue) Exposed: Yes Tendon Exposed: No Muscle Exposed: No Joint Exposed: No Bone Exposed: No Periwound Skin Texture Texture Color No Abnormalities Noted: No No Abnormalities Noted: No Callus: No Atrophie Blanche: No Crepitus: No Cyanosis: No Excoriation: No Ecchymosis: No Induration: No Erythema: No Rash: No Hemosiderin Staining: No Scarring: No Mottled: No Pallor: No Moisture Rubor: No No Abnormalities Noted: No Dry / Scaly: No Temperature / Pain Maceration: No Temperature: No Abnormality Tenderness on Palpation: Yes Electronic Signature(s) Signed: 02/24/2022 11:35:40 AM By: Worthy Rancher Entered By: Worthy Rancher on 02/16/2022 15:47:11 -------------------------------------------------------------------------------- Vitals Details Patient Name: Date of Service: Janyth Contes Y. 02/16/2022 3:15 PM Medical Record Number: [id number] Patient Account Number: [id number] Date of Birth/Sex: Treating RN: 05/19/1935 (86 y.o. F) Primary Care Castella Lerner: Alain Honey Other Clinician: Referring Shiva Karis: Treating Olina Melfi/Extender: Cathleen Fears in Treatment: 1 Vital Signs Time Taken: 15:06 Temperature (F): 97.4 Pulse (bpm): 101 Respiratory Rate (breaths/min): 20 Blood Pressure (mmHg): 156/81 Reference Range: 80 - 120 mg / dl Electronic Signature(s) Signed: 02/24/2022 11:35:40 AM By: Worthy Rancher Entered By: Worthy Rancher on 02/16/2022 15:06:38

## 2022-02-28 ENCOUNTER — Ambulatory Visit: Payer: Medicare Other | Admitting: Cardiovascular Disease

## 2022-03-01 ENCOUNTER — Encounter: Payer: Self-pay | Admitting: Family Medicine

## 2022-03-01 ENCOUNTER — Ambulatory Visit (INDEPENDENT_AMBULATORY_CARE_PROVIDER_SITE_OTHER): Payer: Medicare Other | Admitting: Family Medicine

## 2022-03-01 VITALS — BP 120/78 | HR 76 | Temp 96.6°F | Ht 63.0 in | Wt 117.6 lb

## 2022-03-01 DIAGNOSIS — R350 Frequency of micturition: Secondary | ICD-10-CM

## 2022-03-01 DIAGNOSIS — R197 Diarrhea, unspecified: Secondary | ICD-10-CM | POA: Diagnosis not present

## 2022-03-01 DIAGNOSIS — F0390 Unspecified dementia without behavioral disturbance: Secondary | ICD-10-CM | POA: Diagnosis not present

## 2022-03-01 DIAGNOSIS — I639 Cerebral infarction, unspecified: Secondary | ICD-10-CM | POA: Diagnosis not present

## 2022-03-01 DIAGNOSIS — L03119 Cellulitis of unspecified part of limb: Secondary | ICD-10-CM

## 2022-03-01 DIAGNOSIS — G3184 Mild cognitive impairment, so stated: Secondary | ICD-10-CM

## 2022-03-01 DIAGNOSIS — E11628 Type 2 diabetes mellitus with other skin complications: Secondary | ICD-10-CM | POA: Diagnosis not present

## 2022-03-01 DIAGNOSIS — I442 Atrioventricular block, complete: Secondary | ICD-10-CM | POA: Diagnosis not present

## 2022-03-01 NOTE — Progress Notes (Signed)
Provider:  Alain Honey, MD  Careteam: Patient Care Team: Wardell Honour, MD as PCP - General (Family Medicine) Evans Lance, MD as PCP - Electrophysiology (Cardiology) Raynelle Bring, MD as Consulting Physician (Urology) Evans Lance, MD as Consulting Physician (Cardiology) Deneise Lever, MD as Consulting Physician (Pulmonary Disease) [facility] [name], MD as Consulting Physician (Endocrinology) Newt Minion, MD as Consulting Physician (Orthopedic Surgery) Raynelle Bring, MD as Consulting Physician (Urology) Rex Kras, Claudette Stapler, RN as [place] Management  PLACE OF SERVICE:  [place]  Advanced Directive information    Allergies  Allergen Reactions   Chlordiazepoxide-Clidinium Other (See Comments)    Sleepy,weak   Biaxin [Clarithromycin] Other (See Comments)    Foul taste, abd pain, diarrhea   Tape Other (See Comments)    SKIN IS VERY THIN AND WILL TEAR AND BRUISE EASILY!!   Ultram [Tramadol] Other (See Comments)    Caused tremors   Codeine Other (See Comments)    Upset the stomach    Chief Complaint  Patient presents with   Acute Visit    Patient presents today for frequent urination.     HPI: Patient is a 86 y.o. female here to evaluate urinary frequency.  She is having some dysuria and we ordered a urinalysis but she was unable to urinate in the specimen cup.  Her sugars have chronically been elevated and thus I suspect that has something to do with her frequent urination.  She could also have overactive bladder as well as possible infection. She is seeing folks at wound care center for her foot wounds but again diabetes control is less than optimal and will definitely impact healing Review of Systems:  Review of Systems  Constitutional:  Positive for weight loss.  Respiratory: Negative.    Cardiovascular: Negative.   Skin:  Positive for itching and rash.       On buttocks  Neurological: Negative.    Psychiatric/Behavioral: Negative.    All other systems reviewed and are negative.   Past Medical History:  Diagnosis Date   Acute ischemic stroke ([place]) 08/10/2021   Anxiety disorder, unspecified    Arthritis    Benign paroxysmal positional vertigo 08/09/2012   Chronic rhinitis    Cognitive communication deficit    Constipation, unspecified    COPD mixed type ([place])    on home O2   Diverticulosis    Esophageal stricture    Essential (primary) hypertension    Gastro-esophageal reflux disease without esophagitis    Glaucoma    Hiatal hernia    Hyperlipidemia, unspecified    Irritable bowel syndrome    Ischemic stroke ([place]) 08/09/2021   Presence of permanent cardiac pacemaker 01/14/2020   Spinal stenosis, lumbar region, without neurogenic claudication    Steatohepatitis    Thrombocytopenia, unspecified ([name])    Type 2 diabetes mellitus with diabetic polyneuropathy ([place])    Past Surgical History:  Procedure Laterality Date   APPENDECTOMY     CARPAL TUNNEL RELEASE     right hand   CHOLECYSTECTOMY OPEN  1978   COLONOSCOPY  07-2001   mild diverticulosis   ESOPHAGOGASTRODUODENOSCOPY  2836,62-94   H Hernia,es.stricture s/p dil 35F   INTRAOCULAR LENS INSERTION Bilateral    LIVER BIOPSY  [id number]   PACEMAKER IMPLANT N/A 01/14/2020   Procedure: PACEMAKER IMPLANT;  Surgeon: Evans Lance, MD;  Location: [facility];  Service: Cardiovascular;  Laterality: N/A;   PARATHYROID EXPLORATION  TONSILLECTOMY AND ADENOIDECTOMY     TOTAL ABDOMINAL HYSTERECTOMY     ULNAR NERVE TRANSPOSITION  12/07/2011   Procedure: ULNAR NERVE DECOMPRESSION/TRANSPOSITION;this was cancelled-not done  Surgeon: Cammie Sickle., MD;  Location: [place] [facility];  Service: Orthopedics;  Laterality: Right;  right ulnar nerve in situ decompression   ULNAR TUNNEL RELEASE  03/07/2012   Procedure: CUBITAL TUNNEL RELEASE;  Surgeon: Roseanne Kaufman, MD;  Location: [facility];  Service:  Orthopedics;  Laterality: Right;  ulnar nerve release at the elbow     Social History:   reports that she quit smoking about 33 years ago. Her smoking use included cigarettes. She has never used smokeless tobacco. She reports that she does not drink alcohol and does not use drugs.  Family History  Problem Relation Age of Onset   Heart disease Father    Lung cancer Mother        small cell;Byssinosis   Lung cancer Sister    Liver cancer Sister        ? mets from another area of the body   Diabetes Other        grandmother   Stroke Maternal Grandfather     Medications: Patient's Medications  New Prescriptions   No medications on file  Previous Medications   ACETAMINOPHEN (TYLENOL) 325 MG TABLET    Take 2 tablets (650 mg total) by mouth every 6 (six) hours as needed for mild pain (or Fever >/= 101).   ALBUTEROL (VENTOLIN HFA) 108 (90 BASE) MCG/ACT INHALER    Inhale 1-2 puffs into the lungs every 6 (six) hours as needed for wheezing or shortness of breath.   ALPRAZOLAM (XANAX) 0.5 MG TABLET    Take 0.25 mg by mouth daily as needed for anxiety.   AMOXICILLIN-CLAVULANATE (AUGMENTIN) 875-125 MG TABLET    Take 1 tablet by mouth every 12 (twelve) hours.   APIXABAN (ELIQUIS) 5 MG TABS TABLET    Take 1 tablet (5 mg total) by mouth 2 (two) times daily.   APIXABAN (ELIQUIS) VTE STARTER PACK (10MG AND 5MG)    Take as directed on package: start with two-52m tablets twice daily for 7 days. On day 8, switch to one-567mtablet twice daily.   BUSPIRONE (BUSPAR) 5 MG TABLET    Take 5 mg by mouth 3 (three) times daily.   FEEDING SUPPLEMENT (ENSURE ENLIVE / ENSURE PLUS) LIQD    Take 237 mLs by mouth 2 (two) times daily between meals.   FLUOROMETHOLONE (FML) 0.1 % OPHTHALMIC SUSPENSION    Place 1 drop into both eyes 3 (three) times daily.   FLUTICASONE (FLOVENT HFA) 110 MCG/ACT INHALER    Inhale 1 puff into the lungs 2 (two) times daily.   FUROSEMIDE (LASIX) 20 MG TABLET    Take 20 mg by mouth daily.    INSULIN ASPART (NOVOLOG FLEXPEN) 100 UNIT/ML FLEXPEN    Inject 5 Units into the skin 3 (three) times daily with meals.   INSULIN DEGLUDEC (TRESIBA FLEXTOUCH) 200 UNIT/ML FLEXTOUCH PEN    Inject 26 Units into the skin at bedtime.   IPRATROPIUM-ALBUTEROL (DUONEB) 0.5-2.5 (3) MG/3ML SOLN    Use 1 vial in nebulizer every 8 hours and as needed   LEVALBUTEROL (XOPENEX HFA) 45 MCG/ACT INHALER    Inhale 1-2 puffs into the lungs every 6 (six) hours as needed for wheezing.   METOPROLOL SUCCINATE (TOPROL-XL) 50 MG 24 HR TABLET    TAKE ONE TABLET TWICE A DAY WITH OR IMMEDIATELY  FOLLOWING A MEAL.   MULTIPLE VITAMIN (MULTIVITAMIN WITH MINERALS) TABS TABLET    Take 1 tablet by mouth daily.   PANTOPRAZOLE (PROTONIX) 40 MG TABLET    Take 1 tablet (40 mg total) by mouth daily.   PRAVASTATIN (PRAVACHOL) 20 MG TABLET    Take 1 tablet (20 mg total) by mouth daily.   THIAMINE 100 MG TABLET    Take 1 tablet (100 mg total) by mouth daily.   TIOTROPIUM BROMIDE-OLODATEROL (STIOLTO RESPIMAT) 2.5-2.5 MCG/ACT AERS    Inhale 2 puffs into the lungs daily.  Modified Medications   No medications on file  Discontinued Medications   No medications on file    Physical Exam:  Vitals:   03/01/22 1044  BP: 120/78  Pulse: 76  Temp: (!) 96.6 F (35.9 C)  SpO2: 98%  Weight: 117 lb 9.6 oz (53.3 kg)  Height: 5' 3"  (1.6 m)   Body mass index is 20.83 kg/m. Wt Readings from Last 3 Encounters:  03/01/22 117 lb 9.6 oz (53.3 kg)  01/13/22 121 lb 11.1 oz (55.2 kg)  01/04/22 126 lb 3.2 oz (57.2 kg)    Physical Exam Vitals and nursing note reviewed.  Constitutional:      Appearance: Normal appearance.     Comments: Appears to have lost weight and weight compared to last visit confirms  Eyes:     Extraocular Movements: Extraocular movements intact.     Pupils: Pupils are equal, round, and reactive to light.  Cardiovascular:     Rate and Rhythm: Normal rate and regular rhythm.  Pulmonary:     Effort: Pulmonary effort is  normal.     Breath sounds: Normal breath sounds.  Abdominal:     General: Bowel sounds are normal.     Palpations: Abdomen is soft.  Skin:    General: Skin is warm.     Findings: Rash present.     Comments: Erythema involving gluteal fold suspicious for monilia Also has a darker lesion on the left lower buttock.  Could be pressure sore but have asked her to show this area to the wound clinic later this week  Neurological:     General: No focal deficit present.     Mental Status: She is alert and oriented to person, place, and time.  Psychiatric:        Mood and Affect: Mood normal.        Behavior: Behavior normal.     Labs reviewed: Basic Metabolic Panel: Recent Labs    08/10/21 0438 08/13/21 0209 10/03/21 0329 10/03/21 0731 01/10/22 1342 01/10/22 1350 01/11/22 0923 01/11/22 1054 01/12/22 0349 01/13/22 0534  NA  --    < > 140   < > 125*   < > 136  --  133* 136  K  --    < > 4.1   < > 5.6*   < > 3.8  --  4.0 3.8  CL  --    < > 106   < > 89*   < > 98  --  95* 97*  CO2  --    < > 17*   < > 25   < > 29  --  28 30  GLUCOSE  --    < > 253*   < > 720*   < > 97  --  279* 169*  BUN  --    < > 28*   < > 26*   < > 16  --  16  13  CREATININE  --    < > 1.27*   < > 0.68   < > 0.57  --  0.71 0.49  CALCIUM  --    < > 9.2   < > 9.3   < > 9.0  --  8.8* 8.7*  MG  --   --  1.8  --  1.7  --   --   --   --   --   TSH 1.621  --   --   --   --   --   --  2.226  --   --    < > = values in this interval not displayed.   Liver Function Tests: Recent Labs    12/08/21 1743 12/09/21 0231 01/10/22 1342  AST 17 15 23   ALT 14 10 18   ALKPHOS 79 61 79  BILITOT 0.9 0.7 0.6  PROT 8.9* 7.1 7.5  ALBUMIN 4.6 3.5 3.6   Recent Labs    10/02/21 1835 12/08/21 1743 01/10/22 1342  LIPASE 24 34 69*   Recent Labs    08/09/21 1315  AMMONIA 40*   CBC: Recent Labs    11/09/21 1330 11/09/21 1340 12/08/21 1743 12/08/21 1753 01/10/22 1342 01/10/22 1350 01/11/22 1054 01/12/22 0349  WBC  18.3*   < > 12.7*   < > 9.2  --  14.5* 7.9  NEUTROABS 16.3*  --  8.8*  --  6.8  --   --   --   HGB 17.3*   < > 18.6*   < > 15.8* 17.0* 14.0 13.8  HCT 55.5*   < > 56.8*   < > 49.2* 50.0* 42.1 41.7  MCV 92.7   < > 89.9   < > 94.8  --  89.4 91.4  PLT 336   < > 335   < > 96*  --  289 200   < > = values in this interval not displayed.   Lipid Panel: Recent Labs    05/31/21 1119 08/09/21 1315  CHOL 125 90  HDL 55 38*  LDLCALC 48 42  TRIG 135 50  CHOLHDL 2.3 2.4   TSH: Recent Labs    08/10/21 0438 01/11/22 1054  TSH 1.621 2.226   A1C: Lab Results  Component Value Date   HGBA1C 13.2 (H) 01/10/2022     Assessment/Plan  1. Cellulitis in diabetic foot ([place]) Foot has definitely improved on antibiotics.  Still has some wounds especially under the little toe on the left with eschar  2. Complete heart block ([place]) Pacemaker in place.  Rhythm is sinus rhythm today  3. Dementia, unspecified dementia severity, unspecified dementia type, unspecified whether behavioral, psychotic, or mood disturbance or anxiety ([place]) I am not sure if she has dementia but she does have some cognitive impairment.  She is very stubborn but recalls most any and everything that occurs around her and answers questions appropriately    5. Mild cognitive impairment with memory loss See above.  I think this is a more appropriate diagnosis in her than dementia   6. Urinary frequency We will rule out infection with urinalysis when patient provide specimen.  May be overactive bladder and/or hyperglycemia.  I did give her some samples of Myrbetriq to try but I really do not think that is the answer   Alain Honey, MD [place] [phone]

## 2022-03-02 ENCOUNTER — Encounter (HOSPITAL_BASED_OUTPATIENT_CLINIC_OR_DEPARTMENT_OTHER): Payer: Medicare Other | Admitting: Internal Medicine

## 2022-03-06 ENCOUNTER — Ambulatory Visit: Payer: Medicare Other | Attending: Internal Medicine

## 2022-03-06 DIAGNOSIS — Z950 Presence of cardiac pacemaker: Secondary | ICD-10-CM | POA: Diagnosis not present

## 2022-03-06 DIAGNOSIS — I442 Atrioventricular block, complete: Secondary | ICD-10-CM | POA: Insufficient documentation

## 2022-03-06 LAB — CUP PACEART REMOTE DEVICE CHECK
Battery Remaining Longevity: 94 mo
Battery Remaining Percentage: 81 %
Battery Voltage: 3.01 V
Brady Statistic AP VP Percent: 1.1 %
Brady Statistic AP VS Percent: 1 %
Brady Statistic AS VP Percent: 99 %
Brady Statistic AS VS Percent: 1 %
Brady Statistic RA Percent Paced: 1 %
Brady Statistic RV Percent Paced: 99 %
Date Time Interrogation Session: 20231030033846
Implantable Lead Connection Status: 753985
Implantable Lead Connection Status: 753985
Implantable Lead Implant Date: 20210908
Implantable Lead Implant Date: 20210908
Implantable Lead Location: 753859
Implantable Lead Location: 753860
Implantable Pulse Generator Implant Date: 20210908
Lead Channel Impedance Value: 440 Ohm
Lead Channel Impedance Value: 530 Ohm
Lead Channel Pacing Threshold Amplitude: 0.75 V
Lead Channel Pacing Threshold Amplitude: 0.75 V
Lead Channel Pacing Threshold Pulse Width: 0.4 ms
Lead Channel Pacing Threshold Pulse Width: 0.5 ms
Lead Channel Sensing Intrinsic Amplitude: 10.8 mV
Lead Channel Sensing Intrinsic Amplitude: 3.6 mV
Lead Channel Setting Pacing Amplitude: 1 V
Lead Channel Setting Pacing Amplitude: 2 V
Lead Channel Setting Pacing Pulse Width: 0.5 ms
Lead Channel Setting Sensing Sensitivity: 2 mV
Pulse Gen Model: 2272
Pulse Gen Serial Number: 3856705

## 2022-03-07 ENCOUNTER — Ambulatory Visit: Payer: Medicare Other | Admitting: Cardiovascular Disease

## 2022-03-08 ENCOUNTER — Other Ambulatory Visit: Payer: Self-pay | Admitting: Family Medicine

## 2022-03-14 ENCOUNTER — Other Ambulatory Visit: Payer: Self-pay

## 2022-03-14 ENCOUNTER — Encounter: Payer: Self-pay | Admitting: Infectious Disease

## 2022-03-14 ENCOUNTER — Ambulatory Visit (INDEPENDENT_AMBULATORY_CARE_PROVIDER_SITE_OTHER): Payer: Medicare Other | Admitting: Infectious Disease

## 2022-03-14 VITALS — BP 132/75 | HR 94 | Temp 98.1°F

## 2022-03-14 DIAGNOSIS — I442 Atrioventricular block, complete: Secondary | ICD-10-CM | POA: Diagnosis not present

## 2022-03-14 DIAGNOSIS — E1121 Type 2 diabetes mellitus with diabetic nephropathy: Secondary | ICD-10-CM

## 2022-03-14 DIAGNOSIS — L97511 Non-pressure chronic ulcer of other part of right foot limited to breakdown of skin: Secondary | ICD-10-CM

## 2022-03-14 DIAGNOSIS — E1151 Type 2 diabetes mellitus with diabetic peripheral angiopathy without gangrene: Secondary | ICD-10-CM

## 2022-03-14 DIAGNOSIS — E1165 Type 2 diabetes mellitus with hyperglycemia: Secondary | ICD-10-CM | POA: Diagnosis not present

## 2022-03-14 DIAGNOSIS — A419 Sepsis, unspecified organism: Secondary | ICD-10-CM | POA: Diagnosis not present

## 2022-03-14 DIAGNOSIS — E08621 Diabetes mellitus due to underlying condition with foot ulcer: Secondary | ICD-10-CM

## 2022-03-14 DIAGNOSIS — E1169 Type 2 diabetes mellitus with other specified complication: Secondary | ICD-10-CM | POA: Diagnosis not present

## 2022-03-14 DIAGNOSIS — M8618 Other acute osteomyelitis, other site: Secondary | ICD-10-CM

## 2022-03-14 DIAGNOSIS — J9611 Chronic respiratory failure with hypoxia: Secondary | ICD-10-CM

## 2022-03-14 DIAGNOSIS — M869 Osteomyelitis, unspecified: Secondary | ICD-10-CM

## 2022-03-14 DIAGNOSIS — N179 Acute kidney failure, unspecified: Secondary | ICD-10-CM | POA: Diagnosis not present

## 2022-03-14 DIAGNOSIS — F03A0 Unspecified dementia, mild, without behavioral disturbance, psychotic disturbance, mood disturbance, and anxiety: Secondary | ICD-10-CM | POA: Diagnosis not present

## 2022-03-14 DIAGNOSIS — I739 Peripheral vascular disease, unspecified: Secondary | ICD-10-CM

## 2022-03-14 DIAGNOSIS — R6520 Severe sepsis without septic shock: Secondary | ICD-10-CM | POA: Diagnosis not present

## 2022-03-14 DIAGNOSIS — Z950 Presence of cardiac pacemaker: Secondary | ICD-10-CM

## 2022-03-14 HISTORY — DX: Osteomyelitis, unspecified: M86.9

## 2022-03-14 MED ORDER — DOXYCYCLINE HYCLATE 100 MG PO TABS: 100.0000 mg | ORAL_TABLET | Freq: Two times a day (BID) | ORAL | 11 refills | Status: DC

## 2022-03-14 MED ORDER — CEFADROXIL 500 MG PO CAPS: 1000.0000 mg | ORAL_CAPSULE | Freq: Two times a day (BID) | ORAL | 11 refills | Status: DC

## 2022-03-14 NOTE — Progress Notes (Signed)
Subjective:   Reason for Infectious Disease Consult:  Requesting Physician: Alain Honey, MD   Patient ID: [name], female    DOB: [birth year], 86 y.o.   MRN: [id number]  HPI  86 year old with hx of hemic stroke, complete heart block with pacemaker  cognitive impairment with diabetes mellitus and peripheral vascular disease,  with poor blood flow with chronic wounds he has now been found to have osteomyelitis of the fifth metatarsal left foot.  She unfortunately has very poorly controlled diabetes mellitus and is admitted to the hospital nearly monthly due to problems with hyperglycemia.  She is being followed by wound care and it sounds as if she was followed by podiatry in the past.  She comes today to clinic accompanied by her home health aide.  She believes she is on doxycycline currently and has been on that for roughly a month.  She has not seen podiatry or orthopedic surgery.  She does have an appoint with Dr. Alvester Chou with cardiology.  Of note her toe brachial indices were not optimal on the left side in particular.    She also has had diabetic foot ulcers in the opposite foot as well which have gone quite deep by her description.  I can see that she had plain films performed in July of that foot which did not show osteomyelitis radiographically.   I did explain to her that her osteomyelitis would NOT curable with IV or oral antibiotics but that we could potentially improve her current condition with antibiotics and hopefully her pain which is fairly significant in her left foot.  She is seeing Kalman Shan with Wound care.  I do think she should at least see a podiatrist or an orthopedic foot surgeon even if she is not at all eager for surgery.  Certainly her vascular status would also need to be more fully evaluated and optimized if she was going to have an amputation.      Past Medical History:  Diagnosis Date   Acute ischemic stroke ([place]) 08/10/2021    Anxiety disorder, unspecified    Arthritis    Benign paroxysmal positional vertigo 08/09/2012   Chronic rhinitis    Cognitive communication deficit    Constipation, unspecified    COPD mixed type ([place])    on home O2   Diverticulosis    Esophageal stricture    Essential (primary) hypertension    Gastro-esophageal reflux disease without esophagitis    Glaucoma    Hiatal hernia    Hyperlipidemia, unspecified    Irritable bowel syndrome    Ischemic stroke ([place]) 08/09/2021   Osteomyelitis of fifth toe of left foot ([place]) 03/14/2022   Presence of permanent cardiac pacemaker 01/14/2020   Spinal stenosis, lumbar region, without neurogenic claudication    Steatohepatitis    Thrombocytopenia, unspecified ([place])    Type 2 diabetes mellitus with diabetic polyneuropathy ([place])     Past Surgical History:  Procedure Laterality Date   APPENDECTOMY     CARPAL TUNNEL RELEASE     right hand   CHOLECYSTECTOMY OPEN  1978   COLONOSCOPY  07-2001   mild diverticulosis   ESOPHAGOGASTRODUODENOSCOPY  7078,67-54   H Hernia,es.stricture s/p dil 63F   INTRAOCULAR LENS INSERTION Bilateral    LIVER BIOPSY  08-9199   PACEMAKER IMPLANT N/A 01/14/2020   Procedure: PACEMAKER IMPLANT;  Surgeon: Evans Lance, MD;  Location: [facility];  Service: Cardiovascular;  Laterality: N/A;   PARATHYROID EXPLORATION  TONSILLECTOMY AND ADENOIDECTOMY     TOTAL ABDOMINAL HYSTERECTOMY     ULNAR NERVE TRANSPOSITION  12/07/2011   Procedure: ULNAR NERVE DECOMPRESSION/TRANSPOSITION;this was cancelled-not done  Surgeon: Cammie Sickle., MD;  Location: [facility];  Service: Orthopedics;  Laterality: Right;  right ulnar nerve in situ decompression   ULNAR TUNNEL RELEASE  03/07/2012   Procedure: CUBITAL TUNNEL RELEASE;  Surgeon: Roseanne Kaufman, MD;  Location: [facility];  Service: Orthopedics;  Laterality: Right;  ulnar nerve release at the elbow      Family History  Problem Relation  Age of Onset   Heart disease Father    Lung cancer Mother        small cell;Byssinosis   Lung cancer Sister    Liver cancer Sister        ? mets from another area of the body   Diabetes Other        grandmother   Stroke Maternal Grandfather       Social History   Socioeconomic History   Marital status: Single    Spouse name: John,divorced, deceased   Number of children: 0   Years of education: high school   Highest education level: High school graduate  Occupational History   Occupation: Retired     Comment: Training and development officer  Tobacco Use   Smoking status: Former    Types: Cigarettes    Quit date: 05/08/1988    Years since quitting: 33.8   Smokeless tobacco: Never  Vaping Use   Vaping Use: Never used  Substance and Sexual Activity   Alcohol use: No    Alcohol/week: 0.0 standard drinks of alcohol   Drug use: No   Sexual activity: Not Currently  Other Topics Concern   Not on file  Social History Narrative   Not on file   Social Determinants of Health   Financial Resource Strain: Low Risk  (10/24/2021)   Overall Financial Resource Strain (CARDIA)    Difficulty of Paying Living Expenses: Not hard at all  Food Insecurity: No Food Insecurity (01/16/2022)   Hunger Vital Sign    Worried About Running Out of Food in the Last Year: Never true    [place] in the Last Year: Never true  Transportation Needs: No Transportation Needs (01/16/2022)   PRAPARE - Hydrologist (Medical): No    Lack of Transportation (Non-Medical): No  Recent Concern: Transportation Needs - Unmet Transportation Needs (10/24/2021)   PRAPARE - Transportation    Lack of Transportation (Medical): Yes    Lack of Transportation (Non-Medical): Yes  Physical Activity: Not on file  Stress: No Stress Concern Present (04/28/2020)   [place]    Feeling of Stress : Only a little  Social Connections: Moderately Isolated  (02/17/2020)   Social Connection and Isolation Panel [NHANES]    Frequency of Communication with Friends and Family: More than three times a week    Frequency of Social Gatherings with Friends and Family: More than three times a week    Attends Religious Services: More than 4 times per year    Active Member of [employer] or Organizations: No    Attends Music therapist: Patient refused    Marital Status: Divorced    Allergies  Allergen Reactions   Chlordiazepoxide-Clidinium Other (See Comments)    Sleepy,weak   Biaxin [Clarithromycin] Other (See Comments)    Foul taste, abd pain, diarrhea  Tape Other (See Comments)    SKIN IS VERY THIN AND WILL TEAR AND BRUISE EASILY!!   Ultram [Tramadol] Other (See Comments)    Caused tremors   Codeine Other (See Comments)    Upset the stomach     Current Outpatient Medications:    acetaminophen (TYLENOL) 325 MG tablet, Take 2 tablets (650 mg total) by mouth every 6 (six) hours as needed for mild pain (or Fever >/= 101)., Disp: , Rfl:    albuterol (VENTOLIN HFA) 108 (90 Base) MCG/ACT inhaler, Inhale 1-2 puffs into the lungs every 6 (six) hours as needed for wheezing or shortness of breath., Disp: 18 g, Rfl: 6   ALPRAZolam (XANAX) 0.5 MG tablet, Take 0.25 mg by mouth daily as needed for anxiety., Disp: , Rfl:    amoxicillin-clavulanate (AUGMENTIN) 875-125 MG tablet, Take 1 tablet by mouth every 12 (twelve) hours., Disp: 14 tablet, Rfl: 0   apixaban (ELIQUIS) 5 MG TABS tablet, Take 1 tablet (5 mg total) by mouth 2 (two) times daily., Disp: 60 tablet, Rfl: 6   APIXABAN (ELIQUIS) VTE STARTER PACK (10MG AND 5MG), Take as directed on package: start with two-73m tablets twice daily for 7 days. On day 8, switch to one-523mtablet twice daily., Disp: 74 tablet, Rfl: 0   busPIRone (BUSPAR) 5 MG tablet, Take 5 mg by mouth 3 (three) times daily., Disp: , Rfl:    feeding supplement (ENSURE ENLIVE / ENSURE PLUS) LIQD, Take 237 mLs by mouth 2 (two) times  daily between meals., Disp: 237 mL, Rfl: 30   fluorometholone (FML) 0.1 % ophthalmic suspension, Place 1 drop into both eyes 3 (three) times daily., Disp: , Rfl:    fluticasone (FLOVENT HFA) 110 MCG/ACT inhaler, Inhale 1 puff into the lungs 2 (two) times daily., Disp: , Rfl:    furosemide (LASIX) 20 MG tablet, Take 20 mg by mouth daily., Disp: , Rfl:    insulin aspart (NOVOLOG FLEXPEN) 100 UNIT/ML FlexPen, Inject 5 Units into the skin 3 (three) times daily with meals., Disp: 15 mL, Rfl: 3   insulin degludec (TRESIBA FLEXTOUCH) 200 UNIT/ML FlexTouch Pen, Inject 26 Units into the skin at bedtime., Disp: , Rfl:    ipratropium-albuterol (DUONEB) 0.5-2.5 (3) MG/3ML SOLN, Use 1 vial in nebulizer every 8 hours and as needed, Disp: 90 mL, Rfl: 0   levalbuterol (XOPENEX HFA) 45 MCG/ACT inhaler, Inhale 1-2 puffs into the lungs every 6 (six) hours as needed for wheezing., Disp: 15 g, Rfl: 12   metoprolol succinate (TOPROL-XL) 50 MG 24 hr tablet, TAKE ONE TABLET TWICE A DAY WITH OR IMMEDIATELY FOLLOWING A MEAL. (Patient taking differently: Take 50 mg by mouth 2 (two) times daily.), Disp: 180 tablet, Rfl: 3   Multiple Vitamin (MULTIVITAMIN WITH MINERALS) TABS tablet, Take 1 tablet by mouth daily., Disp: 100 tablet, Rfl: 0   pantoprazole (PROTONIX) 40 MG tablet, Take 1 tablet (40 mg total) by mouth daily., Disp: 30 tablet, Rfl: 1   pravastatin (PRAVACHOL) 20 MG tablet, Take 1 tablet (20 mg total) by mouth daily., Disp: 90 tablet, Rfl: 1   thiamine 100 MG tablet, Take 1 tablet (100 mg total) by mouth daily., Disp: 100 tablet, Rfl:    Tiotropium Bromide-Olodaterol (STIOLTO RESPIMAT) 2.5-2.5 MCG/ACT AERS, Inhale 2 puffs into the lungs daily., Disp: 12 g, Rfl: 3   Review of Systems  Unable to perform ROS: Dementia       Objective:   Physical Exam HENT:     Nose: Nose normal.  Eyes:  General:        Right eye: No discharge.        Left eye: No discharge.     Extraocular Movements: Extraocular movements  intact.  Cardiovascular:     Rate and Rhythm: Normal rate.  Pulmonary:     Effort: Pulmonary effort is normal. No respiratory distress.     Breath sounds: No wheezing.  Musculoskeletal:        General: Normal range of motion.  Skin:    Coloration: Skin is pale.  Neurological:     General: No focal deficit present.     Mental Status: She is alert and oriented to person, place, and time.  Psychiatric:        Mood and Affect: Mood normal.        Behavior: Behavior normal.        Thought Content: Thought content normal.        Judgment: Judgment normal.     03/14/2022:  Left foot       Right foot 03/14/2022:           Assessment & Plan:   Osteomyelitis 5th metatarsal left foot:  She had normal inflammatory markers when checked a month ago and may be in the 10% of people who have normal inflammatory markers despite osteomyelitis.  We will continue her doxycycline and add cefadroxil to better cover strep species.  There will remain risk that could have more proximal spread of infection or even systemic infection and bloodstream infections including infection and seeding of her pacemaker.  I will have her come back and see me in 1 month time.  I also made a referral to Wylene Simmer with orthopedic surgery with [facility].  She will see Dr. Alvester Chou from Cardiology soon as well  History of wounds in the opposite foot that went fairly deep:  Would also consider imaging this area more aggressively though it the moment the osteomyelitis of left foot is the more pressing issue  I spent 82 minutes with the patient including than 50% of the time in face to face counseling of the patient regarding her osteomyelitis diabetes mellitus peripheral vascular disease personally reviewing to the foot without contrast along with review of medical records in preparation for the visit and during the visit and in coordination of her care.

## 2022-03-14 NOTE — Patient Instructions (Signed)
Gabapentin is a medication for nerve pain that might help you

## 2022-03-16 ENCOUNTER — Other Ambulatory Visit (HOSPITAL_COMMUNITY): Payer: Self-pay

## 2022-03-16 ENCOUNTER — Ambulatory Visit: Payer: Self-pay

## 2022-03-16 NOTE — Patient Outreach (Signed)
  Care Coordination   03/16/2022 Name: [name] MRN: [id number] DOB: 08/04/1935   Care Coordination Outreach Attempts:  An unsuccessful telephone outreach was attempted for a scheduled appointment today.  Follow Up Plan:  Additional outreach attempts will be made to offer the patient care coordination information and services.   Encounter Outcome:  No Answer  Care Coordination Interventions Activated:  No   Care Coordination Interventions:  No, not indicated    Barb Merino, RN, BSN, CCM Care Management Coordinator [facility]  Direct Phone: [phone]

## 2022-03-22 ENCOUNTER — Ambulatory Visit: Payer: Medicare Other | Attending: Cardiovascular Disease | Admitting: Cardiovascular Disease

## 2022-03-22 ENCOUNTER — Encounter: Payer: Self-pay | Admitting: Cardiovascular Disease

## 2022-03-22 VITALS — BP 102/64 | HR 92 | Ht 63.0 in | Wt 117.2 lb

## 2022-03-22 DIAGNOSIS — I739 Peripheral vascular disease, unspecified: Secondary | ICD-10-CM | POA: Diagnosis not present

## 2022-03-22 NOTE — Patient Instructions (Signed)
Medication Instructions:  Your physician recommends that you continue on your current medications as directed. Please refer to the Current Medication list given to you today.  *If you need a refill on your cardiac medications before your next appointment, please call your pharmacy*   Testing/Procedures: Your physician has requested that you have a lower extremity arterial duplex. During this test, ultrasound is used to evaluate arterial blood flow in the legs. Allow one hour for this exam. There are no restrictions or special instructions. This will take place at [place], [address].   Your physician has requested that you have an ankle brachial index (ABI). During this test an ultrasound and blood pressure cuff are used to evaluate the arteries that supply the arms and legs with blood. Allow thirty minutes for this exam. There are no restrictions or special instructions. This will take place at [place], [address].     Follow-Up: At [facility] you and your health needs are our priority.  As part of our continuing mission to provide you with exceptional heart care, we have created designated Provider Care Teams.  These Care Teams include your primary Cardiologist (physician) and Advanced Practice Providers (APPs -  Physician Assistants and Nurse Practitioners) who all work together to provide you with the care you need, when you need it.  We recommend signing up for the patient portal called "MyChart".  Sign up information is provided on this After Visit Summary.  MyChart is used to connect with patients for Virtual Visits (Telemedicine).  Patients are able to view lab/test results, encounter notes, upcoming appointments, etc.  Non-urgent messages can be sent to your provider as well.   To learn more about what you can do with MyChart, go to [email or web address].    Your next appointment:   3 month(s)  The format for your next appointment:   In  Person  Provider:   Quay Burow, MD

## 2022-03-22 NOTE — Progress Notes (Signed)
**Note [name]-Identified via Obfuscation** 03/22/2022 [name]   Jun 19, 1935  [id number]  Primary Physician Wardell Honour, MD Primary Cardiologist: Lorretta Harp MD Garret Reddish, [place], [place]  HPI:  [name] is a 86 y.o. thin, frail and chronically ill-appearing divorced Caucasian female with no children is accompanied by her caregiver [name].  She has seen Dr. Crissie Sickles as recently as 04/25/2021 status post permanent transvenous pacemaker placed approximately 3 years ago.  She does have treated diabetes and hyperlipidemia.  She recently had venous Doppler studies that showed a left lower extremity DVT involving the femoral and popliteal veins and is on Eliquis oral anticoagulation.  She has a wound on the lateral aspect of her left foot followed by Dr. Heber Bivalve at the wound care center with Dr. Tommy Medal, infectious disease.  She is on antibiotics because of osteomyelitis.  She had lower extremity arterial Doppler studies performed 01/11/2022 revealing a right ABI of 0.87 and TBI of 0.34, left ABI 1.11 and TBI of 0.15.  She does have diabetic peripheral neuropathy clinically as well.   Current Meds  Medication Sig   acetaminophen (TYLENOL) 325 MG tablet Take 2 tablets (650 mg total) by mouth every 6 (six) hours as needed for mild pain (or Fever >/= 101).   albuterol (VENTOLIN HFA) 108 (90 Base) MCG/ACT inhaler Inhale 1-2 puffs into the lungs every 6 (six) hours as needed for wheezing or shortness of breath.   ALPRAZolam (XANAX) 0.5 MG tablet Take 0.25 mg by mouth daily as needed for anxiety.   apixaban (ELIQUIS) 5 MG TABS tablet Take 1 tablet (5 mg total) by mouth 2 (two) times daily.   cefadroxil (DURICEF) 500 MG capsule Take 2 capsules (1,000 mg total) by mouth 2 (two) times daily.   doxycycline (VIBRA-TABS) 100 MG tablet Take 1 tablet (100 mg total) by mouth 2 (two) times daily.   feeding supplement (ENSURE ENLIVE / ENSURE PLUS) LIQD Take 237 mLs by mouth 2 (two) times daily between meals.   fluorometholone  (FML) 0.1 % ophthalmic suspension Place 1 drop into both eyes 3 (three) times daily.   fluticasone (FLOVENT HFA) 110 MCG/ACT inhaler Inhale 1 puff into the lungs 2 (two) times daily.   insulin aspart (NOVOLOG FLEXPEN) 100 UNIT/ML FlexPen Inject 5 Units into the skin 3 (three) times daily with meals.   insulin degludec (TRESIBA FLEXTOUCH) 200 UNIT/ML FlexTouch Pen Inject 26 Units into the skin at bedtime.   ipratropium-albuterol (DUONEB) 0.5-2.5 (3) MG/3ML SOLN Use 1 vial in nebulizer every 8 hours and as needed   levalbuterol (XOPENEX HFA) 45 MCG/ACT inhaler Inhale 1-2 puffs into the lungs every 6 (six) hours as needed for wheezing.   metoprolol succinate (TOPROL-XL) 50 MG 24 hr tablet TAKE ONE TABLET TWICE A DAY WITH OR IMMEDIATELY FOLLOWING A MEAL. (Patient taking differently: Take 50 mg by mouth 2 (two) times daily.)   Multiple Vitamin (MULTIVITAMIN WITH MINERALS) TABS tablet Take 1 tablet by mouth daily.   pantoprazole (PROTONIX) 40 MG tablet Take 1 tablet (40 mg total) by mouth daily.   pravastatin (PRAVACHOL) 20 MG tablet Take 1 tablet (20 mg total) by mouth daily.   thiamine 100 MG tablet Take 1 tablet (100 mg total) by mouth daily.   Tiotropium Bromide-Olodaterol (STIOLTO RESPIMAT) 2.5-2.5 MCG/ACT AERS Inhale 2 puffs into the lungs daily.     Allergies  Allergen Reactions   Chlordiazepoxide-Clidinium Other (See Comments)    Sleepy,weak   Biaxin [Clarithromycin] Other (See Comments)  Foul taste, abd pain, diarrhea   Tape Other (See Comments)    SKIN IS VERY THIN AND WILL TEAR AND BRUISE EASILY!!   Ultram [Tramadol] Other (See Comments)    Caused tremors   Codeine Other (See Comments)    Upset the stomach    Social History   Socioeconomic History   Marital status: Single    Spouse name: [name],divorced, deceased   Number of children: 0   Years of education: high school   Highest education level: High school graduate  Occupational History   Occupation: Retired     Comment:  Training and development officer  Tobacco Use   Smoking status: Former    Types: Cigarettes    Quit date: 05/08/1988    Years since quitting: 33.8   Smokeless tobacco: Never  Vaping Use   Vaping Use: Never used  Substance and Sexual Activity   Alcohol use: No    Alcohol/week: 0.0 standard drinks of alcohol   Drug use: No   Sexual activity: Not Currently  Other Topics Concern   Not on file  Social History Narrative   Not on file   Social Determinants of Health   Financial Resource Strain: Low Risk  (10/24/2021)   Overall Financial Resource Strain (CARDIA)    Difficulty of Paying Living Expenses: Not hard at all  Food Insecurity: No Food Insecurity (01/16/2022)   Hunger Vital Sign    Worried About Running Out of Food in the Last Year: Never true    [place] in the Last Year: Never true  Transportation Needs: No Transportation Needs (01/16/2022)   PRAPARE - Hydrologist (Medical): No    Lack of Transportation (Non-Medical): No  Recent Concern: Transportation Needs - Unmet Transportation Needs (10/24/2021)   PRAPARE - Transportation    Lack of Transportation (Medical): Yes    Lack of Transportation (Non-Medical): Yes  Physical Activity: Not on file  Stress: No Stress Concern Present (04/28/2020)   [place]    Feeling of Stress : Only a little  Social Connections: Moderately Isolated (02/17/2020)   Social Connection and Isolation Panel [NHANES]    Frequency of Communication with Friends and Family: More than three times a week    Frequency of Social Gatherings with Friends and Family: More than three times a week    Attends Religious Services: More than 4 times per year    Active Member of [employer] or Organizations: No    Attends Archivist Meetings: Patient refused    Marital Status: Divorced  Human resources officer Violence: Not At Risk (10/24/2021)   Humiliation, Afraid, Rape, and Kick questionnaire     Fear of Current or Ex-Partner: No    Emotionally Abused: No    Physically Abused: No    Sexually Abused: No     Review of Systems: General: negative for chills, fever, night sweats or weight changes.  Cardiovascular: negative for chest pain, dyspnea on exertion, edema, orthopnea, palpitations, paroxysmal nocturnal dyspnea or shortness of breath Dermatological: negative for rash Respiratory: negative for cough or wheezing Urologic: negative for hematuria Abdominal: negative for nausea, vomiting, diarrhea, bright red blood per rectum, melena, or hematemesis Neurologic: negative for visual changes, syncope, or dizziness All other systems reviewed and are otherwise negative except as noted above.    Blood pressure 102/64, pulse 92, height 5' 3"  (1.6 m), weight 117 lb 3.2 oz (53.2 kg).  General appearance: alert and no distress Neck:  no adenopathy, no carotid bruit, no JVD, supple, symmetrical, trachea midline, and thyroid not enlarged, symmetric, no tenderness/mass/nodules Lungs: clear to auscultation bilaterally Heart: regular rate and rhythm, S1, S2 normal, no murmur, click, rub or gallop Extremities: extremities normal, atraumatic, no cyanosis or edema Pulses: 2+ and symmetric Skin: Diabetic ulcer left lateral foot. Neurologic: Grossly normal  EKG atrial sensed, ventricular paced rhythm at 92.  I personally reviewed this EKG.  ASSESSMENT AND PLAN:   Peripheral arterial disease [facility] Mr. Phillis was referred to me by Dr. Heber Kewanna for PAD evaluation.  She has a wound on the lateral aspect of her left foot adjacent to her fifth metatarsal.  Apparently she has osteo and is on antibiotics followed by Dr. Tommy Medal.  She is minimally ambulatory and is currently in a wheelchair.  Dopplers performed 01/11/2022 revealed a right ABI of 0.87 and a left of 1.11.  Her right TBI was 0.34 and left was 0.15.  I cannot palpate a dorsalis pedis and posterior tibial pulse although faintly.  I am going to  get lower extremity arterial duplex study to further evaluate her vasculature.  At this point, after talking to Dr. Heber Aynor, we will pursue a conservative noninvasive approach and reserve intervention for progression or nonhealing.     Lorretta Harp MD FACP,FACC,FAHA, [facility] 03/22/2022 12:47 PM

## 2022-03-22 NOTE — Assessment & Plan Note (Signed)
Mr. Brzoska was referred to me by Dr. Heber [place] for PAD evaluation.  She has a wound on the lateral aspect of her left foot adjacent to her fifth metatarsal.  Apparently she has osteo and is on antibiotics followed by Dr. Tommy Medal.  She is minimally ambulatory and is currently in a wheelchair.  Dopplers performed 01/11/2022 revealed a right ABI of 0.87 and a left of 1.11.  Her right TBI was 0.34 and left was 0.15.  I cannot palpate a dorsalis pedis and posterior tibial pulse although faintly.  I am going to get lower extremity arterial duplex study to further evaluate her vasculature.  At this point, after talking to Dr. Heber King, we will pursue a conservative noninvasive approach and reserve intervention for progression or nonhealing.

## 2022-03-23 ENCOUNTER — Telehealth: Payer: Self-pay

## 2022-03-23 NOTE — Telephone Encounter (Addendum)
Late entry from 11/15 @ 12:30pm: on the way in to see Dr. Gwenlyn Found today pt fell while in parking garage on [address] right outside of the elevator. Pt states that she did not trip over anything, she simply lost her balance and fell backwards landing on her back and striking the back of her head on the ground. Pt and caregiver state that pt did not lose consciousness at anytime. Pt does say that she has a knot on her head at time of office visit. Pt seen by Dr. Gwenlyn Found and assessed. Pt is currently on Eliquis to treat a DVT.  Pt was instructed to leave our office and go straight to the emergency room for evaluation. Pt and caregiver verbalize importance of going to emergency room for evaluation. Pt and caregiver verbalize understanding.

## 2022-03-24 ENCOUNTER — Ambulatory Visit: Payer: Medicare Other | Attending: Internal Medicine | Admitting: Internal Medicine

## 2022-03-24 ENCOUNTER — Telehealth: Payer: Self-pay

## 2022-03-27 NOTE — Telephone Encounter (Signed)
Spoke with pt's caregiver who states that pt did not want to go to the emergency room after leaving our office. She does state that pt seems to be doing ok and that she did check on her today. Caregiver states she seems to have no issues from the fall. Caregiver will continue to monitor pt and verbalizes understanding.

## 2022-03-28 ENCOUNTER — Emergency Department (HOSPITAL_COMMUNITY): Payer: Medicare Other

## 2022-03-28 ENCOUNTER — Emergency Department (HOSPITAL_COMMUNITY)
Admission: EM | Admit: 2022-03-28 | Discharge: 2022-03-28 | Disposition: A | Discharge status: Home / Self Care | Payer: Medicare Other | Attending: Emergency Medicine | Admitting: Emergency Medicine

## 2022-03-28 ENCOUNTER — Other Ambulatory Visit: Payer: Self-pay

## 2022-03-28 DIAGNOSIS — Z7901 Long term (current) use of anticoagulants: Secondary | ICD-10-CM | POA: Diagnosis not present

## 2022-03-28 DIAGNOSIS — M4312 Spondylolisthesis, cervical region: Secondary | ICD-10-CM | POA: Diagnosis not present

## 2022-03-28 DIAGNOSIS — W19XXXA Unspecified fall, initial encounter: Secondary | ICD-10-CM

## 2022-03-28 DIAGNOSIS — N3001 Acute cystitis with hematuria: Secondary | ICD-10-CM | POA: Insufficient documentation

## 2022-03-28 DIAGNOSIS — Z4800 Encounter for change or removal of nonsurgical wound dressing: Secondary | ICD-10-CM | POA: Insufficient documentation

## 2022-03-28 DIAGNOSIS — S79912A Unspecified injury of left hip, initial encounter: Secondary | ICD-10-CM | POA: Diagnosis present

## 2022-03-28 DIAGNOSIS — W01198A Fall on same level from slipping, tripping and stumbling with subsequent striking against other object, initial encounter: Secondary | ICD-10-CM | POA: Insufficient documentation

## 2022-03-28 DIAGNOSIS — Z794 Long term (current) use of insulin: Secondary | ICD-10-CM | POA: Diagnosis not present

## 2022-03-28 DIAGNOSIS — S71002A Unspecified open wound, left hip, initial encounter: Secondary | ICD-10-CM | POA: Insufficient documentation

## 2022-03-28 DIAGNOSIS — E119 Type 2 diabetes mellitus without complications: Secondary | ICD-10-CM | POA: Insufficient documentation

## 2022-03-28 DIAGNOSIS — M47812 Spondylosis without myelopathy or radiculopathy, cervical region: Secondary | ICD-10-CM | POA: Diagnosis not present

## 2022-03-28 DIAGNOSIS — S91302A Unspecified open wound, left foot, initial encounter: Secondary | ICD-10-CM | POA: Diagnosis not present

## 2022-03-28 DIAGNOSIS — G319 Degenerative disease of nervous system, unspecified: Secondary | ICD-10-CM | POA: Diagnosis not present

## 2022-03-28 DIAGNOSIS — S0990XA Unspecified injury of head, initial encounter: Secondary | ICD-10-CM | POA: Diagnosis not present

## 2022-03-28 DIAGNOSIS — R300 Dysuria: Secondary | ICD-10-CM | POA: Diagnosis not present

## 2022-03-28 DIAGNOSIS — Z5189 Encounter for other specified aftercare: Secondary | ICD-10-CM

## 2022-03-28 LAB — URINALYSIS, ROUTINE W REFLEX MICROSCOPIC
Bilirubin Urine: NEGATIVE
Glucose, UA: 50 mg/dL — AB
Ketones, ur: NEGATIVE mg/dL
Nitrite: NEGATIVE
Protein, ur: 100 mg/dL — AB
Specific Gravity, Urine: 1.012 (ref 1.005–1.030)
WBC, UA: >50 WBC/hpf — ABNORMAL HIGH (ref 0–5)
pH: 6 (ref 5.0–8.0)

## 2022-03-28 LAB — COMPREHENSIVE METABOLIC PANEL
ALT: 12 U/L (ref 0–44)
AST: 17 U/L (ref 15–41)
Albumin: 2.9 g/dL — ABNORMAL LOW (ref 3.5–5.0)
Alkaline Phosphatase: 65 U/L (ref 38–126)
Anion gap: 10 (ref 5–15)
BUN: 24 mg/dL — ABNORMAL HIGH (ref 8–23)
CO2: 28 mmol/L (ref 22–32)
Calcium: 9.3 mg/dL (ref 8.9–10.3)
Chloride: 93 mmol/L — ABNORMAL LOW (ref 98–111)
Creatinine, Ser: 0.57 mg/dL (ref 0.44–1.00)
GFR, Estimated: >60 mL/min (ref >60)
Glucose, Bld: 253 mg/dL — ABNORMAL HIGH (ref 70–99)
Potassium: 4.5 mmol/L (ref 3.5–5.1)
Sodium: 131 mmol/L — ABNORMAL LOW (ref 135–145)
Total Bilirubin: 0.5 mg/dL (ref 0.3–1.2)
Total Protein: 7.3 g/dL (ref 6.5–8.1)

## 2022-03-28 LAB — CBC WITH DIFFERENTIAL/PLATELET
Abs Immature Granulocytes: 0.08 10*3/uL — ABNORMAL HIGH (ref 0.00–0.07)
Basophils Absolute: 0.1 10*3/uL (ref 0.0–0.1)
Basophils Relative: 1 %
Eosinophils Absolute: 0.1 10*3/uL (ref 0.0–0.5)
Eosinophils Relative: 1 %
HCT: 47.7 % — ABNORMAL HIGH (ref 36.0–46.0)
Hemoglobin: 15.2 g/dL — ABNORMAL HIGH (ref 12.0–15.0)
Immature Granulocytes: 1 %
Lymphocytes Relative: 12 %
Lymphs Abs: 1.3 10*3/uL (ref 0.7–4.0)
MCH: 27.6 pg (ref 26.0–34.0)
MCHC: 31.9 g/dL (ref 30.0–36.0)
MCV: 86.7 fL (ref 80.0–100.0)
Monocytes Absolute: 0.6 10*3/uL (ref 0.1–1.0)
Monocytes Relative: 6 %
Neutro Abs: 8.5 10*3/uL — ABNORMAL HIGH (ref 1.7–7.7)
Neutrophils Relative %: 79 %
Platelets: 242 10*3/uL (ref 150–400)
RBC: 5.5 MIL/uL — ABNORMAL HIGH (ref 3.87–5.11)
RDW: 13.6 % (ref 11.5–15.5)
WBC: 10.7 10*3/uL — ABNORMAL HIGH (ref 4.0–10.5)
nRBC: 0 % (ref 0.0–0.2)

## 2022-03-28 LAB — LIPASE, BLOOD: Lipase: 26 U/L (ref 11–51)

## 2022-03-28 NOTE — Discharge Instructions (Addendum)
Your work-up today showed you have a UTI.  However you are on 2 antibiotics that would be sufficient to cover your UTI.  No change in antibiotics today.  We will send this for urine culture and if sensitivities show that the antibiotics are not sufficient we will call and change these.  CT scan of the head and neck did not show any concerning findings.  Wound does not look infected.  Blood work overall reassuring.  I recommend offloading your sacral region to minimize worsening of the wound.  You can use pressure dressings.  Follow-up with your PCP.  If any concerning symptoms please return to the emergency room.

## 2022-03-28 NOTE — ED Provider Notes (Addendum)
[facility]-EMERGENCY DEPT Provider Note   CSN: [id number] Arrival date & time: 03/28/22  1211     History  Chief Complaint  Patient presents with   Wound Check    [name] is a 86 y.o. female.  86 year old female with past medical history of peripheral arterial disease, diabetes, osteomyelitis currently on antibiotics presents today for evaluation of wound evaluation to her left hip.  She also states that she fell several days ago striking the back of her head.  She states this was prior to her cardiology visit and she was evaluated at the cardiology office regarding this until she was okay.  However her story does not appear to be consistent and she has not mentioned this to either EMS or the nurse prior to me.  I do not see any wounds on exam to the head.  She denies any fever.  However states since yesterday she has also been having dysuria.  The history is provided by the patient. No language interpreter was used.       Home Medications Prior to Admission medications   Medication Sig Start Date End Date Taking? Authorizing Provider  acetaminophen (TYLENOL) 325 MG tablet Take 2 tablets (650 mg total) by mouth every 6 (six) hours as needed for mild pain (or Fever >/= 101). 01/23/20   Zannie Cove, MD  albuterol (VENTOLIN HFA) 108 (90 Base) MCG/ACT inhaler Inhale 1-2 puffs into the lungs every 6 (six) hours as needed for wheezing or shortness of breath. 12/26/21   Young, Rennis Chris, MD  ALPRAZolam Prudy Feeler) 0.5 MG tablet Take 0.25 mg by mouth daily as needed for anxiety.    [provider]  apixaban (ELIQUIS) 5 MG TABS tablet Take 1 tablet (5 mg total) by mouth 2 (two) times daily. 02/13/22   Regalado, Belkys A, MD  busPIRone (BUSPAR) 5 MG tablet Take 5 mg by mouth 3 (three) times daily. Patient not taking: Reported on 03/22/2022    [provider]  cefadroxil (DURICEF) 500 MG capsule Take 2 capsules (1,000 mg total) by mouth 2 (two)  times daily. 03/14/22   Randall Hiss, MD  doxycycline (VIBRA-TABS) 100 MG tablet Take 1 tablet (100 mg total) by mouth 2 (two) times daily. 03/14/22   Randall Hiss, MD  feeding supplement (ENSURE ENLIVE / ENSURE PLUS) LIQD Take 237 mLs by mouth 2 (two) times daily between meals. 12/12/21   Narda Bonds, MD  fluorometholone (FML) 0.1 % ophthalmic suspension Place 1 drop into both eyes 3 (three) times daily. 12/01/21   [provider]  fluticasone (FLOVENT HFA) 110 MCG/ACT inhaler Inhale 1 puff into the lungs 2 (two) times daily.    [provider]  furosemide (LASIX) 20 MG tablet Take 20 mg by mouth daily. Patient not taking: Reported on 03/22/2022    [provider]  insulin aspart (NOVOLOG FLEXPEN) 100 UNIT/ML FlexPen Inject 5 Units into the skin 3 (three) times daily with meals. 11/13/21   Lyndle Herrlich, MD  insulin degludec (TRESIBA FLEXTOUCH) 200 UNIT/ML FlexTouch Pen Inject 26 Units into the skin at bedtime. 12/12/21   Narda Bonds, MD  ipratropium-albuterol (DUONEB) 0.5-2.5 (3) MG/3ML SOLN Use 1 vial in nebulizer every 8 hours and as needed 01/13/22   Regalado, Jon Billings A, MD  levalbuterol (XOPENEX HFA) 45 MCG/ACT inhaler Inhale 1-2 puffs into the lungs every 6 (six) hours as needed for wheezing. 12/30/21   Waymon Budge, MD  metoprolol succinate (TOPROL-XL)  50 MG 24 hr tablet TAKE ONE TABLET TWICE A DAY WITH OR IMMEDIATELY FOLLOWING A MEAL. Patient taking differently: Take 50 mg by mouth 2 (two) times daily. 11/02/21   [name], MD  Multiple Vitamin (MULTIVITAMIN WITH MINERALS) TABS tablet Take 1 tablet by mouth daily. 08/14/21   Pokhrel, Rebekah Chesterfield, MD  pantoprazole (PROTONIX) 40 MG tablet Take 1 tablet (40 mg total) by mouth daily. 08/13/21 08/13/22  Pokhrel, Rebekah Chesterfield, MD  pravastatin (PRAVACHOL) 20 MG tablet Take 1 tablet (20 mg total) by mouth daily. 02/28/21   [name], MD  thiamine 100 MG tablet Take 1 tablet (100 mg total) by mouth  daily. 08/14/21   Pokhrel, Rebekah Chesterfield, MD  Tiotropium Bromide-Olodaterol (STIOLTO RESPIMAT) 2.5-2.5 MCG/ACT AERS Inhale 2 puffs into the lungs daily. 10/20/21   Waymon Budge, MD      Allergies    Chlordiazepoxide-clidinium, Biaxin [clarithromycin], Tape, Ultram [tramadol], and Codeine    Review of Systems   Review of Systems  Constitutional:  Negative for chills and fever.  Gastrointestinal:  Negative for abdominal pain and nausea.  Genitourinary:  Positive for dysuria. Negative for flank pain.  Skin:  Positive for wound.  Neurological:  Negative for headaches.  All other systems reviewed and are negative.   Physical Exam Updated Vital Signs BP 122/63   Pulse 100   Temp 98.5 F (36.9 C) (Oral)   Resp 18   SpO2 98%  Physical Exam Vitals and nursing note reviewed.  Constitutional:      General: She is not in acute distress.    Appearance: Normal appearance. She is not ill-appearing.  HENT:     Head: Normocephalic and atraumatic.     Comments: Head atraumatic.  No signs of hematoma or other wound.    Nose: Nose normal.  Eyes:     General: No scleral icterus.    Extraocular Movements: Extraocular movements intact.     Conjunctiva/sclera: Conjunctivae normal.  Cardiovascular:     Rate and Rhythm: Normal rate and regular rhythm.     Pulses: Normal pulses.     Heart sounds: Normal heart sounds.  Pulmonary:     Effort: Pulmonary effort is normal. No respiratory distress.     Breath sounds: Normal breath sounds. No wheezing or rales.  Abdominal:     General: There is no distension.     Palpations: Abdomen is soft.     Tenderness: There is abdominal tenderness (Lower abdominal tenderness to palpation). There is no right CVA tenderness, left CVA tenderness or guarding.  Musculoskeletal:        General: Normal range of motion.     Cervical back: Normal range of motion.     Right lower leg: No edema.     Left lower leg: No edema.     Comments: Wound noted to left lateral foot.   History of osteomyelitis.  On antibiotics.  Skin:    General: Skin is warm and dry.     Comments: Wound noted to left hip.  See attached image.  Erythema to the sacral region without any wound.  Neurological:     General: No focal deficit present.     Mental Status: She is alert and oriented to person, place, and time. Mental status is at baseline.     ED Results / Procedures / Treatments   Labs (all labs ordered are listed, but only abnormal results are displayed) Labs Reviewed  CBC WITH DIFFERENTIAL/PLATELET - Abnormal; Notable for the following components:  Result Value   WBC 10.7 (*)    RBC 5.50 (*)    Hemoglobin 15.2 (*)    HCT 47.7 (*)    Neutro Abs 8.5 (*)    Abs Immature Granulocytes 0.08 (*)    All other components within normal limits  COMPREHENSIVE METABOLIC PANEL - Abnormal; Notable for the following components:   Sodium 131 (*)    Chloride 93 (*)    Glucose, Bld 253 (*)    BUN 24 (*)    Albumin 2.9 (*)    All other components within normal limits  URINALYSIS, ROUTINE W REFLEX MICROSCOPIC - Abnormal; Notable for the following components:   APPearance CLOUDY (*)    Glucose, UA 50 (*)    Hgb urine dipstick MODERATE (*)    Protein, ur 100 (*)    Leukocytes,Ua LARGE (*)    WBC, UA >50 (*)    Bacteria, UA RARE (*)    All other components within normal limits  LIPASE, BLOOD    EKG None  Radiology No results found.  Procedures Procedures    Medications Ordered in ED Medications - No data to display  ED Course/ Medical Decision Making/ A&P                           Medical Decision Making Amount and/or Complexity of Data Reviewed Labs: ordered. Radiology: ordered.   Medical Decision Making / ED Course   This patient presents to the ED for concern of wound check, fall this involves an extensive number of treatment options, and is a complaint that carries with it a high risk of complications and morbidity.  The differential diagnosis includes  acute intracranial injury, cellulitis, osteomyelitis  MDM: 86 year old female presents today for evaluation of above-mentioned complaints.  Overall patient is without acute distress.  Chronically ill-appearing.  Wound noted to left hip without signs of infection, drainage, or surrounding cellulitis.  See the above attached picture.  UA with evidence of UTI.  However she is on 2 antibiotics for her osteomyelitis including doxycycline and Duricef.  She also reports a fall that occurred about 3 to 4 days ago.  She is on Eliquis.  Believes she struck her head.  No injury noted on exam.  History is obtained by patient's caregiver.  Patient's caregiver confirms that patient did have a fall and has been attempting to get patient to come to the hospital for evaluation but patient has been refusing.  Will obtain CT head, cervical spine.  CT head and cervical spine without acute injury.  Given patient is already on antibiotics for osteomyelitis feel those antibiotics are sufficient to cover the UTI.  We will send urine culture and address antibiotic change pending sensitivities.  Discussed follow-up with PCP outpatient.  Good distal DP pulses bilaterally.  Patient and plan discussed with attending who is in agreement.  Plan discussed with patient's caregiver Rinaldo Cloud who is in agreement with plan.  Discussed patient is to follow-up with PCP for ongoing management of her left hip wound, as well as sacral wound.  There is some redness of her sacrum which is likely stage I pressure ulcer given the amount of time she spends in a wheelchair.  Did discuss offloading the area with caregiver.  Lab Tests: -I ordered, reviewed, and interpreted labs.   The pertinent results include:   Labs Reviewed  CBC WITH DIFFERENTIAL/PLATELET - Abnormal; Notable for the following components:      Result Value  WBC 10.7 (*)    RBC 5.50 (*)    Hemoglobin 15.2 (*)    HCT 47.7 (*)    Neutro Abs 8.5 (*)    Abs Immature Granulocytes 0.08  (*)    All other components within normal limits  COMPREHENSIVE METABOLIC PANEL - Abnormal; Notable for the following components:   Sodium 131 (*)    Chloride 93 (*)    Glucose, Bld 253 (*)    BUN 24 (*)    Albumin 2.9 (*)    All other components within normal limits  URINALYSIS, ROUTINE W REFLEX MICROSCOPIC - Abnormal; Notable for the following components:   APPearance CLOUDY (*)    Glucose, UA 50 (*)    Hgb urine dipstick MODERATE (*)    Protein, ur 100 (*)    Leukocytes,Ua LARGE (*)    WBC, UA >50 (*)    Bacteria, UA RARE (*)    All other components within normal limits  LIPASE, BLOOD      EKG  EKG Interpretation  Date/Time:    Ventricular Rate:    PR Interval:    QRS Duration:   QT Interval:    QTC Calculation:   R Axis:     Text Interpretation:           Imaging Studies ordered: I ordered imaging studies including CT head, CT cervical spine I independently visualized and interpreted imaging. I agree with the radiologist interpretation   Medicines ordered and prescription drug management: No orders of the defined types were placed in this encounter.   -I have reviewed the patients home medicines and have made adjustments as needed   Reevaluation: After the interventions noted above, I reevaluated the patient and found that they have :stayed the same  Co morbidities that complicate the patient evaluation  Past Medical History:  Diagnosis Date   Acute ischemic stroke (HCC) 08/10/2021   Anxiety disorder, unspecified    Arthritis    Benign paroxysmal positional vertigo 08/09/2012   Chronic rhinitis    Cognitive communication deficit    Constipation, unspecified    COPD mixed type (HCC)    on home O2   Diverticulosis    Esophageal stricture    Essential (primary) hypertension    Gastro-esophageal reflux disease without esophagitis    Glaucoma    Hiatal hernia    Hyperlipidemia, unspecified    Irritable bowel syndrome    Ischemic stroke (HCC)  08/09/2021   Osteomyelitis of fifth toe of left foot (HCC) 03/14/2022   Presence of permanent cardiac pacemaker 01/14/2020   Spinal stenosis, lumbar region, without neurogenic claudication    Steatohepatitis    Thrombocytopenia, unspecified (HCC)    Type 2 diabetes mellitus with diabetic polyneuropathy (HCC)       Dispostion: Is appropriate for discharge.  Discharged in stable condition.  Return precaution discussed.  Patient and caregiver voiced understanding and are in agreement with plan.  Final Clinical Impression(s) / ED Diagnoses Final diagnoses:  Visit for wound check  Acute cystitis with hematuria  Fall, initial encounter  Injury of head, initial encounter    Rx / DC Orders ED Discharge Orders     None         Marita Kansas, PA-C 03/28/22 1506    Marita Kansas, PA-C 03/28/22 1507    Margarita Grizzle, MD 03/29/22 [phone]

## 2022-03-28 NOTE — ED Triage Notes (Signed)
Pt from home via [facility] with reports of new wound on buttock. Denies fevers. Pt also reports burning with urination.

## 2022-03-29 ENCOUNTER — Ambulatory Visit (HOSPITAL_COMMUNITY): Payer: Medicare Other

## 2022-03-29 LAB — URINE CULTURE: Culture: <10000 — AB

## 2022-04-02 ENCOUNTER — Encounter (HOSPITAL_BASED_OUTPATIENT_CLINIC_OR_DEPARTMENT_OTHER): Payer: Medicare Other | Admitting: Internal Medicine

## 2022-04-04 ENCOUNTER — Other Ambulatory Visit: Payer: Self-pay

## 2022-04-04 ENCOUNTER — Inpatient Hospital Stay (HOSPITAL_COMMUNITY)
Admission: EM | Admit: 2022-04-04 | Discharge: 2022-04-27 | DRG: 871 | Disposition: A | Discharge status: Skilled Nursing Facility | Payer: Medicare Other | Attending: Student | Admitting: Student

## 2022-04-04 ENCOUNTER — Emergency Department (HOSPITAL_COMMUNITY): Payer: Medicare Other

## 2022-04-04 ENCOUNTER — Encounter (HOSPITAL_COMMUNITY): Payer: Self-pay | Admitting: Internal Medicine

## 2022-04-04 ENCOUNTER — Ambulatory Visit (HOSPITAL_BASED_OUTPATIENT_CLINIC_OR_DEPARTMENT_OTHER): Payer: Medicare Other | Admitting: Internal Medicine

## 2022-04-04 DIAGNOSIS — E871 Hypo-osmolality and hyponatremia: Secondary | ICD-10-CM | POA: Diagnosis not present

## 2022-04-04 DIAGNOSIS — Z91199 Patient's noncompliance with other medical treatment and regimen due to unspecified reason: Secondary | ICD-10-CM

## 2022-04-04 DIAGNOSIS — R109 Unspecified abdominal pain: Secondary | ICD-10-CM | POA: Diagnosis not present

## 2022-04-04 DIAGNOSIS — K6389 Other specified diseases of intestine: Secondary | ICD-10-CM | POA: Diagnosis not present

## 2022-04-04 DIAGNOSIS — L89890 Pressure ulcer of other site, unstageable: Secondary | ICD-10-CM | POA: Diagnosis present

## 2022-04-04 DIAGNOSIS — N136 Pyonephrosis: Secondary | ICD-10-CM | POA: Diagnosis present

## 2022-04-04 DIAGNOSIS — K5900 Constipation, unspecified: Secondary | ICD-10-CM | POA: Insufficient documentation

## 2022-04-04 DIAGNOSIS — L89222 Pressure ulcer of left hip, stage 2: Secondary | ICD-10-CM | POA: Diagnosis not present

## 2022-04-04 DIAGNOSIS — E1169 Type 2 diabetes mellitus with other specified complication: Secondary | ICD-10-CM | POA: Diagnosis not present

## 2022-04-04 DIAGNOSIS — E1142 Type 2 diabetes mellitus with diabetic polyneuropathy: Secondary | ICD-10-CM | POA: Diagnosis present

## 2022-04-04 DIAGNOSIS — Z0189 Encounter for other specified special examinations: Secondary | ICD-10-CM

## 2022-04-04 DIAGNOSIS — N3000 Acute cystitis without hematuria: Secondary | ICD-10-CM | POA: Diagnosis present

## 2022-04-04 DIAGNOSIS — I10 Essential (primary) hypertension: Secondary | ICD-10-CM | POA: Diagnosis present

## 2022-04-04 DIAGNOSIS — A4181 Sepsis due to Enterococcus: Secondary | ICD-10-CM | POA: Diagnosis not present

## 2022-04-04 DIAGNOSIS — G9341 Metabolic encephalopathy: Secondary | ICD-10-CM | POA: Diagnosis present

## 2022-04-04 DIAGNOSIS — Z950 Presence of cardiac pacemaker: Secondary | ICD-10-CM | POA: Diagnosis present

## 2022-04-04 DIAGNOSIS — E43 Unspecified severe protein-calorie malnutrition: Secondary | ICD-10-CM | POA: Diagnosis present

## 2022-04-04 DIAGNOSIS — Z7901 Long term (current) use of anticoagulants: Secondary | ICD-10-CM

## 2022-04-04 DIAGNOSIS — M8008XA Age-related osteoporosis with current pathological fracture, vertebra(e), initial encounter for fracture: Secondary | ICD-10-CM | POA: Diagnosis not present

## 2022-04-04 DIAGNOSIS — K219 Gastro-esophageal reflux disease without esophagitis: Secondary | ICD-10-CM | POA: Diagnosis not present

## 2022-04-04 DIAGNOSIS — Z9981 Dependence on supplemental oxygen: Secondary | ICD-10-CM

## 2022-04-04 DIAGNOSIS — I700 Atherosclerosis of aorta: Secondary | ICD-10-CM | POA: Diagnosis not present

## 2022-04-04 DIAGNOSIS — Z66 Do not resuscitate: Secondary | ICD-10-CM | POA: Diagnosis not present

## 2022-04-04 DIAGNOSIS — F419 Anxiety disorder, unspecified: Secondary | ICD-10-CM | POA: Diagnosis present

## 2022-04-04 DIAGNOSIS — Z681 Body mass index (BMI) 19 or less, adult: Secondary | ICD-10-CM

## 2022-04-04 DIAGNOSIS — F0390 Unspecified dementia without behavioral disturbance: Secondary | ICD-10-CM | POA: Diagnosis present

## 2022-04-04 DIAGNOSIS — Z7401 Bed confinement status: Secondary | ICD-10-CM | POA: Diagnosis not present

## 2022-04-04 DIAGNOSIS — J9611 Chronic respiratory failure with hypoxia: Secondary | ICD-10-CM | POA: Diagnosis not present

## 2022-04-04 DIAGNOSIS — D696 Thrombocytopenia, unspecified: Secondary | ICD-10-CM | POA: Diagnosis present

## 2022-04-04 DIAGNOSIS — M86672 Other chronic osteomyelitis, left ankle and foot: Secondary | ICD-10-CM

## 2022-04-04 DIAGNOSIS — R531 Weakness: Secondary | ICD-10-CM | POA: Diagnosis not present

## 2022-04-04 DIAGNOSIS — Z7189 Other specified counseling: Secondary | ICD-10-CM | POA: Diagnosis not present

## 2022-04-04 DIAGNOSIS — J449 Chronic obstructive pulmonary disease, unspecified: Secondary | ICD-10-CM | POA: Diagnosis present

## 2022-04-04 DIAGNOSIS — R338 Other retention of urine: Secondary | ICD-10-CM

## 2022-04-04 DIAGNOSIS — F4489 Other dissociative and conversion disorders: Secondary | ICD-10-CM | POA: Diagnosis not present

## 2022-04-04 DIAGNOSIS — D649 Anemia, unspecified: Secondary | ICD-10-CM | POA: Diagnosis present

## 2022-04-04 DIAGNOSIS — K7581 Nonalcoholic steatohepatitis (NASH): Secondary | ICD-10-CM | POA: Diagnosis not present

## 2022-04-04 DIAGNOSIS — N3289 Other specified disorders of bladder: Secondary | ICD-10-CM | POA: Diagnosis present

## 2022-04-04 DIAGNOSIS — Z8673 Personal history of transient ischemic attack (TIA), and cerebral infarction without residual deficits: Secondary | ICD-10-CM

## 2022-04-04 DIAGNOSIS — Z8249 Family history of ischemic heart disease and other diseases of the circulatory system: Secondary | ICD-10-CM

## 2022-04-04 DIAGNOSIS — S22080A Wedge compression fracture of T11-T12 vertebra, initial encounter for closed fracture: Secondary | ICD-10-CM

## 2022-04-04 DIAGNOSIS — A409 Streptococcal sepsis, unspecified: Secondary | ICD-10-CM | POA: Diagnosis not present

## 2022-04-04 DIAGNOSIS — I482 Chronic atrial fibrillation, unspecified: Secondary | ICD-10-CM | POA: Diagnosis not present

## 2022-04-04 DIAGNOSIS — R339 Retention of urine, unspecified: Secondary | ICD-10-CM

## 2022-04-04 DIAGNOSIS — E11649 Type 2 diabetes mellitus with hypoglycemia without coma: Secondary | ICD-10-CM | POA: Diagnosis present

## 2022-04-04 DIAGNOSIS — E876 Hypokalemia: Secondary | ICD-10-CM | POA: Diagnosis not present

## 2022-04-04 DIAGNOSIS — R41841 Cognitive communication deficit: Secondary | ICD-10-CM | POA: Diagnosis present

## 2022-04-04 DIAGNOSIS — Z8679 Personal history of other diseases of the circulatory system: Secondary | ICD-10-CM

## 2022-04-04 DIAGNOSIS — W19XXXA Unspecified fall, initial encounter: Secondary | ICD-10-CM | POA: Diagnosis not present

## 2022-04-04 DIAGNOSIS — A419 Sepsis, unspecified organism: Secondary | ICD-10-CM | POA: Diagnosis present

## 2022-04-04 DIAGNOSIS — Z885 Allergy status to narcotic agent status: Secondary | ICD-10-CM

## 2022-04-04 DIAGNOSIS — L84 Corns and callosities: Secondary | ICD-10-CM | POA: Diagnosis present

## 2022-04-04 DIAGNOSIS — L89151 Pressure ulcer of sacral region, stage 1: Secondary | ICD-10-CM | POA: Diagnosis present

## 2022-04-04 DIAGNOSIS — M869 Osteomyelitis, unspecified: Secondary | ICD-10-CM | POA: Diagnosis not present

## 2022-04-04 DIAGNOSIS — E8809 Other disorders of plasma-protein metabolism, not elsewhere classified: Secondary | ICD-10-CM | POA: Diagnosis not present

## 2022-04-04 DIAGNOSIS — Z91048 Other nonmedicinal substance allergy status: Secondary | ICD-10-CM

## 2022-04-04 DIAGNOSIS — R140 Abdominal distension (gaseous): Secondary | ICD-10-CM | POA: Diagnosis not present

## 2022-04-04 DIAGNOSIS — E785 Hyperlipidemia, unspecified: Secondary | ICD-10-CM | POA: Diagnosis present

## 2022-04-04 DIAGNOSIS — Z794 Long term (current) use of insulin: Secondary | ICD-10-CM

## 2022-04-04 DIAGNOSIS — R7881 Bacteremia: Secondary | ICD-10-CM | POA: Diagnosis not present

## 2022-04-04 DIAGNOSIS — Z515 Encounter for palliative care: Secondary | ICD-10-CM | POA: Diagnosis not present

## 2022-04-04 DIAGNOSIS — Z833 Family history of diabetes mellitus: Secondary | ICD-10-CM

## 2022-04-04 DIAGNOSIS — Z79899 Other long term (current) drug therapy: Secondary | ICD-10-CM

## 2022-04-04 DIAGNOSIS — S22089A Unspecified fracture of T11-T12 vertebra, initial encounter for closed fracture: Secondary | ICD-10-CM | POA: Diagnosis not present

## 2022-04-04 DIAGNOSIS — H409 Unspecified glaucoma: Secondary | ICD-10-CM | POA: Diagnosis present

## 2022-04-04 DIAGNOSIS — Z823 Family history of stroke: Secondary | ICD-10-CM

## 2022-04-04 DIAGNOSIS — N134 Hydroureter: Secondary | ICD-10-CM | POA: Diagnosis not present

## 2022-04-04 DIAGNOSIS — K589 Irritable bowel syndrome without diarrhea: Secondary | ICD-10-CM | POA: Diagnosis present

## 2022-04-04 DIAGNOSIS — Z792 Long term (current) use of antibiotics: Secondary | ICD-10-CM

## 2022-04-04 DIAGNOSIS — N3001 Acute cystitis with hematuria: Secondary | ICD-10-CM | POA: Diagnosis not present

## 2022-04-04 DIAGNOSIS — E1165 Type 2 diabetes mellitus with hyperglycemia: Secondary | ICD-10-CM | POA: Diagnosis present

## 2022-04-04 DIAGNOSIS — R457 State of emotional shock and stress, unspecified: Secondary | ICD-10-CM | POA: Diagnosis not present

## 2022-04-04 DIAGNOSIS — T68XXXA Hypothermia, initial encounter: Secondary | ICD-10-CM | POA: Diagnosis not present

## 2022-04-04 DIAGNOSIS — N1330 Unspecified hydronephrosis: Secondary | ICD-10-CM | POA: Diagnosis not present

## 2022-04-04 DIAGNOSIS — Z87891 Personal history of nicotine dependence: Secondary | ICD-10-CM

## 2022-04-04 DIAGNOSIS — E1149 Type 2 diabetes mellitus with other diabetic neurological complication: Secondary | ICD-10-CM | POA: Diagnosis not present

## 2022-04-04 DIAGNOSIS — L89329 Pressure ulcer of left buttock, unspecified stage: Secondary | ICD-10-CM | POA: Diagnosis present

## 2022-04-04 DIAGNOSIS — M439 Deforming dorsopathy, unspecified: Secondary | ICD-10-CM | POA: Diagnosis not present

## 2022-04-04 DIAGNOSIS — Z888 Allergy status to other drugs, medicaments and biological substances status: Secondary | ICD-10-CM

## 2022-04-04 DIAGNOSIS — J310 Chronic rhinitis: Secondary | ICD-10-CM | POA: Diagnosis present

## 2022-04-04 DIAGNOSIS — L8990 Pressure ulcer of unspecified site, unspecified stage: Secondary | ICD-10-CM | POA: Diagnosis present

## 2022-04-04 DIAGNOSIS — Z008 Encounter for other general examination: Secondary | ICD-10-CM

## 2022-04-04 LAB — URINALYSIS, ROUTINE W REFLEX MICROSCOPIC
Bilirubin Urine: NEGATIVE
Glucose, UA: >=500 mg/dL — AB
Ketones, ur: 5 mg/dL — AB
Nitrite: NEGATIVE
Protein, ur: 100 mg/dL — AB
Specific Gravity, Urine: 1.009 (ref 1.005–1.030)
WBC, UA: >50 WBC/hpf — ABNORMAL HIGH (ref 0–5)
pH: 6 (ref 5.0–8.0)

## 2022-04-04 LAB — CBC
HCT: 47 % — ABNORMAL HIGH (ref 36.0–46.0)
Hemoglobin: 15.1 g/dL — ABNORMAL HIGH (ref 12.0–15.0)
MCH: 27.7 pg (ref 26.0–34.0)
MCHC: 32.1 g/dL (ref 30.0–36.0)
MCV: 86.1 fL (ref 80.0–100.0)
Platelets: 369 K/uL (ref 150–400)
RBC: 5.46 MIL/uL — ABNORMAL HIGH (ref 3.87–5.11)
RDW: 14.5 % (ref 11.5–15.5)
WBC: 18.8 K/uL — ABNORMAL HIGH (ref 4.0–10.5)
nRBC: 0 % (ref 0.0–0.2)

## 2022-04-04 LAB — HEPATIC FUNCTION PANEL
ALT: 7 U/L (ref 0–44)
AST: 11 U/L — ABNORMAL LOW (ref 15–41)
Albumin: 2.2 g/dL — ABNORMAL LOW (ref 3.5–5.0)
Alkaline Phosphatase: 59 U/L (ref 38–126)
Bilirubin, Direct: 0.2 mg/dL (ref 0.0–0.2)
Indirect Bilirubin: 0.6 mg/dL (ref 0.3–0.9)
Total Bilirubin: 0.8 mg/dL (ref 0.3–1.2)
Total Protein: 6.3 g/dL — ABNORMAL LOW (ref 6.5–8.1)

## 2022-04-04 LAB — BASIC METABOLIC PANEL
Anion gap: 12 (ref 5–15)
BUN: 31 mg/dL — ABNORMAL HIGH (ref 8–23)
CO2: 25 mmol/L (ref 22–32)
Calcium: 9.4 mg/dL (ref 8.9–10.3)
Chloride: 96 mmol/L — ABNORMAL LOW (ref 98–111)
Creatinine, Ser: 0.72 mg/dL (ref 0.44–1.00)
GFR, Estimated: >60 mL/min (ref >60)
Glucose, Bld: 438 mg/dL — ABNORMAL HIGH (ref 70–99)
Potassium: 5.1 mmol/L (ref 3.5–5.1)
Sodium: 133 mmol/L — ABNORMAL LOW (ref 135–145)

## 2022-04-04 LAB — CBG MONITORING, ED: Glucose-Capillary: 415 mg/dL — ABNORMAL HIGH (ref 70–99)

## 2022-04-04 LAB — LIPASE, BLOOD: Lipase: 25 U/L (ref 11–51)

## 2022-04-04 LAB — LACTIC ACID, PLASMA: Lactic Acid, Venous: 2 mmol/L (ref 0.5–1.9)

## 2022-04-04 MED ORDER — SODIUM CHLORIDE 0.9 % IV BOLUS
1000.0000 mL | Freq: Once | INTRAVENOUS | Status: AC
  Administered 2022-04-04: 1000 mL via INTRAVENOUS

## 2022-04-04 MED ORDER — LACTATED RINGERS IV BOLUS: 500.0000 mL | Freq: Once | INTRAVENOUS | Status: DC

## 2022-04-04 MED ORDER — SODIUM CHLORIDE 0.9 % IV BOLUS
500.0000 mL | Freq: Once | INTRAVENOUS | Status: AC
  Administered 2022-04-04: 500 mL via INTRAVENOUS

## 2022-04-04 MED ORDER — SODIUM CHLORIDE 0.9 % IV SOLN
1.0000 g | Freq: Once | INTRAVENOUS | Status: AC
  Administered 2022-04-04: 1 g via INTRAVENOUS
  Filled 2022-04-04: qty 10

## 2022-04-04 MED ORDER — ACETAMINOPHEN 500 MG PO TABS
1000.0000 mg | ORAL_TABLET | Freq: Once | ORAL | Status: AC
  Administered 2022-04-04: 1000 mg via ORAL
  Filled 2022-04-04: qty 2

## 2022-04-04 NOTE — ED Provider Notes (Signed)
[facility] DEPT Provider Note   CSN: [id number] Arrival date & time: 04/04/22  1015     History {Add pertinent medical, surgical, social history, OB history to HPI:1} Chief Complaint  Patient presents with   Weakness    [name] is a 86 y.o. female.  HPI Patient with multiple medical problems presents with concern of weakness.  Patient notes that she has been feeling generally unwell for some time, had an episode of abdominal pain, diarrhea that was profound a few days ago, though the diarrhea has resolved, she has lower abdominal pain intermittently, across the left lower quadrant and suprapubic region.  She notes that she urinates frequently, has no dysuria.  No fever, no vomiting.  Seemingly the patient went to her physician's office today and was sent here for evaluation.  Patient was seen and evaluated in the emergency department about a week ago, head CT that was unremarkable.    Home Medications Prior to Admission medications   Medication Sig Start Date End Date Taking? Authorizing Provider  acetaminophen (TYLENOL) 325 MG tablet Take 2 tablets (650 mg total) by mouth every 6 (six) hours as needed for mild pain (or Fever >/= 101). 01/23/20   Domenic Polite, MD  albuterol (VENTOLIN HFA) 108 (90 Base) MCG/ACT inhaler Inhale 1-2 puffs into the lungs every 6 (six) hours as needed for wheezing or shortness of breath. 12/26/21   Young, Kasandra Knudsen, MD  ALPRAZolam Duanne Moron) 0.5 MG tablet Take 0.25 mg by mouth daily as needed for anxiety.    [provider]  apixaban (ELIQUIS) 5 MG TABS tablet Take 1 tablet (5 mg total) by mouth 2 (two) times daily. 02/13/22   Regalado, Belkys A, MD  busPIRone (BUSPAR) 5 MG tablet Take 5 mg by mouth 3 (three) times daily. Patient not taking: Reported on 03/22/2022    [provider]  cefadroxil (DURICEF) 500 MG capsule Take 2 capsules (1,000 mg total) by mouth 2 (two) times daily. 03/14/22   Truman Hayward, MD  doxycycline (VIBRA-TABS) 100 MG tablet Take 1 tablet (100 mg total) by mouth 2 (two) times daily. 03/14/22   Truman Hayward, MD  feeding supplement (ENSURE ENLIVE / ENSURE PLUS) LIQD Take 237 mLs by mouth 2 (two) times daily between meals. 12/12/21   Mariel Aloe, MD  fluorometholone (FML) 0.1 % ophthalmic suspension Place 1 drop into both eyes 3 (three) times daily. 12/01/21   [provider]  fluticasone (FLOVENT HFA) 110 MCG/ACT inhaler Inhale 1 puff into the lungs 2 (two) times daily.    [provider]  furosemide (LASIX) 20 MG tablet Take 20 mg by mouth daily. Patient not taking: Reported on 03/22/2022    [provider]  insulin aspart (NOVOLOG FLEXPEN) 100 UNIT/ML FlexPen Inject 5 Units into the skin 3 (three) times daily with meals. 11/13/21   Linus Galas, MD  insulin degludec (TRESIBA FLEXTOUCH) 200 UNIT/ML FlexTouch Pen Inject 26 Units into the skin at bedtime. 12/12/21   Mariel Aloe, MD  ipratropium-albuterol (DUONEB) 0.5-2.5 (3) MG/3ML SOLN Use 1 vial in nebulizer every 8 hours and as needed 01/13/22   Regalado, Belkys A, MD  levalbuterol (XOPENEX HFA) 45 MCG/ACT inhaler Inhale 1-2 puffs into the lungs every 6 (six) hours as needed for wheezing. 12/30/21   Deneise Lever, MD  metoprolol succinate (TOPROL-XL) 50 MG 24 hr tablet TAKE ONE TABLET TWICE A DAY WITH OR IMMEDIATELY FOLLOWING A MEAL. Patient taking differently:  Take 50 mg by mouth 2 (two) times daily. 11/02/21   Wardell Honour, MD  Multiple Vitamin (MULTIVITAMIN WITH MINERALS) TABS tablet Take 1 tablet by mouth daily. 08/14/21   Pokhrel, Corrie Mckusick, MD  pantoprazole (PROTONIX) 40 MG tablet Take 1 tablet (40 mg total) by mouth daily. 08/13/21 08/13/22  Pokhrel, Corrie Mckusick, MD  pravastatin (PRAVACHOL) 20 MG tablet Take 1 tablet (20 mg total) by mouth daily. 02/28/21   Wardell Honour, MD  thiamine 100 MG tablet Take 1 tablet (100 mg total) by mouth daily. 08/14/21   Pokhrel, Corrie Mckusick, MD   Tiotropium Bromide-Olodaterol (STIOLTO RESPIMAT) 2.5-2.5 MCG/ACT AERS Inhale 2 puffs into the lungs daily. 10/20/21   Deneise Lever, MD      Allergies    Chlordiazepoxide-clidinium, Biaxin [clarithromycin], Tape, Ultram [tramadol], and Codeine    Review of Systems   Review of Systems  All other systems reviewed and are negative.   Physical Exam Updated Vital Signs BP 128/68   Pulse (!) 106   Temp 98.1 F (36.7 C) (Oral)   Resp 16   Ht 5' 3"  (1.6 m)   Wt 54 kg   SpO2 99%   BMI 21.09 kg/m  Physical Exam Vitals and nursing note reviewed.  Constitutional:      General: She is not in acute distress.    Appearance: She is well-developed. She is ill-appearing. She is not toxic-appearing or diaphoretic.     Comments: Deconditioned appearing elderly female in no distress resting comfortably.  HENT:     Head: Normocephalic and atraumatic.  Eyes:     Conjunctiva/sclera: Conjunctivae normal.  Cardiovascular:     Rate and Rhythm: Regular rhythm. Tachycardia present.  Pulmonary:     Effort: Pulmonary effort is normal. No respiratory distress.     Breath sounds: Normal breath sounds. No stridor.  Abdominal:     General: There is no distension.  Skin:    General: Skin is warm and dry.  Neurological:     Mental Status: She is alert and oriented to person, place, and time.     Cranial Nerves: No cranial nerve deficit.  Psychiatric:        Mood and Affect: Mood normal.     ED Results / Procedures / Treatments   Labs (all labs ordered are listed, but only abnormal results are displayed) Labs Reviewed  BASIC METABOLIC PANEL - Abnormal; Notable for the following components:      Result Value   Sodium 133 (*)    Chloride 96 (*)    Glucose, Bld 438 (*)    BUN 31 (*)    All other components within normal limits  CBC - Abnormal; Notable for the following components:   WBC 18.8 (*)    RBC 5.46 (*)    Hemoglobin 15.1 (*)    HCT 47.0 (*)    All other components within normal  limits  CULTURE, BLOOD (SINGLE)  URINE CULTURE  URINALYSIS, ROUTINE W REFLEX MICROSCOPIC  LACTIC ACID, PLASMA  LACTIC ACID, PLASMA  CBG MONITORING, ED    EKG None  Radiology DG Chest 2 View  Result Date: 04/04/2022 CLINICAL DATA:  Weakness and anorexia. EXAM: CHEST - 2 VIEW COMPARISON:  12/08/2021 FINDINGS: The pacer wires are stable. The heart is normal in size. Stable tortuosity and calcification of the thoracic aorta. No acute pulmonary findings. Stable eventration of the right hemidiaphragm peer the bony thorax is intact. IMPRESSION: No acute cardiopulmonary findings. Electronically Signed   By: Marijo Sanes  M.D.   On: 04/04/2022 15:06    Procedures Procedures  {Document cardiac monitor, telemetry assessment procedure when appropriate:1}  Medications Ordered in ED Medications  sodium chloride 0.9 % bolus 1,000 mL (has no administration in time range)  acetaminophen (TYLENOL) tablet 1,000 mg (has no administration in time range)    ED Course/ Medical Decision Making/ A&P                           Medical Decision Making Elderly female with multiple medical issues presents with weakness, lower abdominal pain after an episode of diarrhea, with ongoing polyuria, but no dysuria.  Vital signs notable for tachycardia, given her abdominal pain, differential including area tract infection, colitis/diverticulitis considered.  Patient received fluid resuscitation, labs, CT scan.  Amount and/or Complexity of Data Reviewed External Data Reviewed: notes.    Details: Per HPI, recent evaluation with radiographic studies of head following possible fall, these were reassuring. Labs: ordered. Decision-making details documented in ED Course.    Details: Initial labs notable for leukocytosis Radiology:     Details: Pending  Risk Decision regarding hospitalization.    Final Clinical Impression(s) / ED Diagnoses Final diagnoses:  None    Rx / DC Orders ED Discharge Orders      None

## 2022-04-04 NOTE — ED Triage Notes (Signed)
Pt bib [facility] from home c/p weakness, nausea and no food intake x 1 week. Currently being treated for UTI with Cipro.

## 2022-04-04 NOTE — Assessment & Plan Note (Addendum)
-   A1c 12.9% on 04/05/2022.  Further speaks to her probable noncompliance and lack of capacity at home - Continue SSI and CBG monitoring - Continue Semglee

## 2022-04-04 NOTE — Assessment & Plan Note (Addendum)
-   any encephalopathy from admission and infection would be considered resolved at this point - no other large electrolyte abnormalities to explain this large of an impairment -CT head performed 03/28/2022 noted with parenchymal atrophy and chronic small vessel disease; atrophy is most prominent in the frontal and temporal regions -Patient does have some risk for development of vascular dementia esp given the location of her atrophy - per my evaluation patient lacks capacity and I've also asked psychiatry to evaluate patient; seen on 12/6 and psychiatry also agrees that patient lacks capacity for refusing SNF placement as well as surgical treatment (latter of which would be more complicated) however, this at least speaks towards her being unsafe for going home alone with no assistance/care - TOC requested for seeking guardianship; DSS has agreed to move forward with protective order and tentatively planned for completing paperwork by the weekend or shortly after

## 2022-04-04 NOTE — Assessment & Plan Note (Addendum)
-   Now has external catheter in place

## 2022-04-04 NOTE — ED Provider Notes (Signed)
4:33 PM Assumed care of patient from off-going team. For more details, please see note from same day.  In brief, this is a 86 y.o. female w/ recent hospitalizations, not a great historian. Abd pain, generalized weakness.   Plan/Dispo at time of sign-out & ED Course since sign-out: [ ]  scan, UA, reassess  BP 128/68   Pulse (!) 106   Temp 98.1 F (36.7 C) (Oral)   Resp 16   Ht 5' 3"  (1.6 m)   Wt 54 kg   SpO2 99%   BMI 21.09 kg/m    ED Course:   Clinical Course as of 04/05/22 0043  Tue Apr 04, 2022  1731 WBC(!): 18.8 [HN]  1731 Hemoglobin(!): 15.1 Volume contracted. Has since received 1L NS. [HN]  1731 Sodium(!): 133 [HN]  1731 Potassium: 5.1 [HN]  1731 Creatinine: 0.72 [HN]  1731 BUN(!): 31 [HN]  1731 Glucose(!): 438 [HN]  1733 DG Chest 2 View No acute cardiopulmonary findings. [HN]  1900 CT ABDOMEN PELVIS WO CONTRAST Severe urinary bladder distention is noted which results in moderate bilateral hydroureteronephrosis.  Acute fracture seen involving superior endplate of Z97 vertebral body.   [HN]  1942 PVR >1000 cc. In s/o acute T12 compression fracture and new urinary retention, will obtain thoracic/lumbar MRIs to assess for cord compression. Patient c/o back pain on my evaluation and has 5/5 strength in BL LEs. However with significant urinary retention, believe MRI is warranted. [HN]  2014 Foley placed, urine in process [HN]  2133 Urinalysis, Routine w reflex microscopic(!) +UTI [HN]  2139 Will order patient IV ceftriaxone.  Patient history of total of 1.5 L of normal saline fluid bolus.  Patient will not be able to see for MRI until tomorrow given her pacemaker placement.  Patient is consulted to hospitalist for admission. [HN]    Clinical Course User Index [HN] Audley Hose, MD    Dispo: Admit on IV abx for UTI with MRI T/L spine planned for tomorrow ------------------------------- Cindee Lame, MD Emergency Medicine  This note was created using dictation  software, which may contain spelling or grammatical errors.   Audley Hose, MD 04/05/22 [phone]

## 2022-04-04 NOTE — Assessment & Plan Note (Signed)
Chronic. 

## 2022-04-04 NOTE — Assessment & Plan Note (Addendum)
-   On 3 L chronically

## 2022-04-04 NOTE — Assessment & Plan Note (Addendum)
-   unable to perform MRI due to pacer -Patient evaluated by neurosurgery.  T12 compression fracture felt to be related to osteoporosis; no associated pain nor retropulsion/stenosis -Brace recommended for comfort with ambulation.  No surgical intervention recommended - Continue working with PT/OT as able

## 2022-04-04 NOTE — ED Provider Triage Note (Addendum)
Emergency Medicine Provider Triage Evaluation Note  [name] , a 86 y.o. female  was evaluated in triage.  Pt complains of feeling unwell for "some time" maybe 1-2 weeks it seems. She lives independently - walks with walker.   States she went to PCP today who sent her here.   Dysuria for ?days.  No fever at home.   Review of Systems  Positive: Dysuria, fatigue Negative: Fever   Physical Exam  BP 128/68   Pulse (!) 106   Temp 98.1 F (36.7 C) (Oral)   Resp 16   Ht 5' 3"  (1.6 m)   Wt 54 kg   SpO2 99%   BMI 21.09 kg/m  Gen:   Awake, no distress   Resp:  Normal effort  MSK:   Moves extremities without difficulty  Other:  Dry oral mucosa  Medical Decision Making  Medically screening exam initiated at 1:35 PM.  Appropriate orders placed.  ADA HOLNESS was informed that the remainder of the evaluation will be completed by another provider, this initial triage assessment does not replace that evaluation, and the importance of remaining in the ED until their evaluation is complete.  Labs, urine/dg chest added. Will request she be moved to room. Hydration NS ordered.    Tedd Sias, [place] 04/04/22 1336    Tedd Sias, [place] 04/04/22 1336

## 2022-04-04 NOTE — Subjective & Objective (Signed)
CC: weakness, altered mental status HPI: 86 yo WF with hx of uncontrolled type 2 DM, history of complete heart block status post pacemaker, chronic protein calorie malnutrition, chronic respiratory failure on home oxygen to 3 L admit presented to the ER today via EMS.  Patient unable to give any history review of systems.  This was copied from the EMS report.:  "Arrived to a residence to find a caregiver with an 3 yof, CAO at baseline, sitting upright on the couch. Caregiver, Jeannene Patella, CNA [phone]) reports pt had just urinated on the floor in front of the couch and wanted EMS to see it before she cleaned it up. Pt's urine was discolored with a loud smell. Caregiver reports pt is currently being treated for a UTI with antibiotics bt has not shown any improvement. Caregiver also reports pt "has not eaten in about 3 or 4 days" and increasingly getting weaker. Vitals and assessments as noted. Pt found to be hyperglycemic than her norm. Caregiver got pt dressed and assisted pt to a standing position. Pt very anxious about standing up and feared she was going to fall. Pt pivoted with assistance from her walker to stair chair. Secured with seatbelts and transferred out of residence. Pt lifted and transferred to stretcher without incident, secured with seatbelts and loaded onto unit. Ongoing vitals and assessments as noted. Pt transported to [name] without incident and remained stable during transport. Upon arrival, pt unloaded, registered and assigned to Triage where pt was transferred from stretcher to wheelchair via draw sheet method without incident. Verbal report given to RN. Signatures obtained. Pt left in the care of [facility] ED staff. EMS cleared."  In the ER, patient noted to have urinary tension.  Foley catheter placed.  Greater than 1700 cc of turbid urine was obtained.   Labs showed a white count of 18.8, hemoglobin 15.1, platelets of 369 Sodium 133, BUN 31, creatinine  0.7  UA showed turbid color.  Negative nitrate.  Positive leukocyte esterase.  Greater than 50 WBCs.   CT abdomen pelvis demonstrated distended urinary bladder with bilateral hydroureteronephrosis.  Acute fracture of the superior endplate of J19  MRI lumbar/thoracic spine ordered due to the patient's acute vertebral fracture.  However given the patient's permanent pacemaker, this cannot be completed until tomorrow when potentially available to potentially reprogram her pacemaker after MRI.  [facility] hospital[facility] contacted for admission.

## 2022-04-04 NOTE — H&P (Addendum)
History and Physical    NOTNAMED CROUCHER NIO:[id number] DOB: 1935/07/11 DOA: 04/04/2022  DOS: the patient was seen and examined on 04/04/2022  PCP: Wardell Honour, MD   Patient coming from: Home  I have personally briefly reviewed patient's old medical records in [place]  CC: weakness, altered mental status HPI: 86 yo WF with hx of uncontrolled type 2 DM, history of complete heart block status post pacemaker, chronic protein calorie malnutrition, chronic respiratory failure on home oxygen to 3 L admit presented to the ER today via EMS.  Patient unable to give any history review of systems.  This was copied from the EMS report.:  "Arrived to a residence to find a caregiver with an 67 yof, CAO at baseline, sitting upright on the couch. Caregiver, Jeannene Patella, CNA [phone]) reports pt had just urinated on the floor in front of the couch and wanted EMS to see it before she cleaned it up. Pt's urine was discolored with a loud smell. Caregiver reports pt is currently being treated for a UTI with antibiotics bt has not shown any improvement. Caregiver also reports pt "has not eaten in about 3 or 4 days" and increasingly getting weaker. Vitals and assessments as noted. Pt found to be hyperglycemic than her norm. Caregiver got pt dressed and assisted pt to a standing position. Pt very anxious about standing up and feared she was going to fall. Pt pivoted with assistance from her walker to stair chair. Secured with seatbelts and transferred out of residence. Pt lifted and transferred to stretcher without incident, secured with seatbelts and loaded onto unit. Ongoing vitals and assessments as noted. Pt transported to [facility] without incident and remained stable during transport. Upon arrival, pt unloaded, registered and assigned to Triage where pt was transferred from stretcher to wheelchair via draw sheet method without incident. Verbal report given to RN. Signatures  obtained. Pt left in the care of [facility] ED staff. EMS cleared."  In the ER, patient noted to have urinary tension.  Foley catheter placed.  Greater than 1700 cc of turbid urine was obtained.   Labs showed a white count of 18.8, hemoglobin 15.1, platelets of 369 Sodium 133, BUN 31, creatinine 0.7  UA showed turbid color.  Negative nitrate.  Positive leukocyte esterase.  Greater than 50 WBCs.   CT abdomen pelvis demonstrated distended urinary bladder with bilateral hydroureteronephrosis.  Acute fracture of the superior endplate of R67  MRI lumbar/thoracic spine ordered due to the patient's acute vertebral fracture.  However given the patient's permanent pacemaker, this cannot be completed until tomorrow when potentially available to potentially reprogram her pacemaker after MRI.  [facility] hospitalist contacted for admission.   ED Course: Urine retention for greater than 1700 cc of urine in her bladder.  CT scan of her abdomen shows T12 vertebral fracture.  Review of Systems:  Review of Systems  Unable to perform ROS: Patient unresponsive    Past Medical History:  Diagnosis Date   Acute ischemic stroke ([place]) 08/10/2021   Anxiety disorder, unspecified    Arthritis    Benign paroxysmal positional vertigo 08/09/2012   Chronic rhinitis    Cognitive communication deficit    Constipation, unspecified    COPD mixed type ([place])    on home O2   Diverticulosis    DKA (diabetic ketoacidosis) ([place]) 10/02/2021   Esophageal stricture    Essential (primary) hypertension    Gastro-esophageal reflux disease without esophagitis    Glaucoma    Hiatal  hernia    Hyperlipidemia, unspecified    Irritable bowel syndrome    Ischemic stroke ([place]) 08/09/2021   Osteomyelitis of fifth toe of left foot ([place]) 03/14/2022   Presence of permanent cardiac pacemaker 01/14/2020   Spinal stenosis, lumbar region, without neurogenic claudication    Steatohepatitis    Thrombocytopenia, unspecified ([place])    Type 2  diabetes mellitus with diabetic polyneuropathy ([place])     Past Surgical History:  Procedure Laterality Date   APPENDECTOMY     CARPAL TUNNEL RELEASE     right hand   CHOLECYSTECTOMY OPEN  1978   COLONOSCOPY  07-2001   mild diverticulosis   ESOPHAGOGASTRODUODENOSCOPY  4742,[id number]   H Hernia,es.stricture s/p dil 10F   INTRAOCULAR LENS INSERTION Bilateral    LIVER BIOPSY  [id number]   PACEMAKER IMPLANT N/A 01/14/2020   Procedure: PACEMAKER IMPLANT;  Surgeon: Evans Lance, MD;  Location: [place] [facility];  Service: Cardiovascular;  Laterality: N/A;   PARATHYROID EXPLORATION     TONSILLECTOMY AND ADENOIDECTOMY     TOTAL ABDOMINAL HYSTERECTOMY     ULNAR NERVE TRANSPOSITION  12/07/2011   Procedure: ULNAR NERVE DECOMPRESSION/TRANSPOSITION;this was cancelled-not done  Surgeon: Cammie Sickle., MD;  Location: [place];  Service: Orthopedics;  Laterality: Right;  right ulnar nerve in situ decompression   ULNAR TUNNEL RELEASE  03/07/2012   Procedure: CUBITAL TUNNEL RELEASE;  Surgeon: Roseanne Kaufman, MD;  Location: [place];  Service: Orthopedics;  Laterality: Right;  ulnar nerve release at the elbow       reports that she quit smoking about 33 years ago. Her smoking use included cigarettes. She has never used smokeless tobacco. She reports that she does not drink alcohol and does not use drugs.  Allergies  Allergen Reactions   Chlordiazepoxide-Clidinium Other (See Comments)    Sleepy,weak   Biaxin [Clarithromycin] Other (See Comments)    Foul taste, abd pain, diarrhea   Tape Other (See Comments)    SKIN IS VERY THIN AND WILL TEAR AND BRUISE EASILY!!   Ultram [Tramadol] Other (See Comments)    Caused tremors   Codeine Other (See Comments)    Upset the stomach    Family History  Problem Relation Age of Onset   Heart disease Father    Lung cancer Mother        small cell;Byssinosis   Lung cancer Sister    Liver cancer Sister        ? mets from  another area of the body   Diabetes Other        grandmother   Stroke Maternal Grandfather     Prior to Admission medications   Medication Sig Start Date End Date Taking? Authorizing Provider  acetaminophen (TYLENOL) 325 MG tablet Take 2 tablets (650 mg total) by mouth every 6 (six) hours as needed for mild pain (or Fever >/= 101). 01/23/20   Domenic Polite, MD  albuterol (VENTOLIN HFA) 108 (90 Base) MCG/ACT inhaler Inhale 1-2 puffs into the lungs every 6 (six) hours as needed for wheezing or shortness of breath. 12/26/21   Young, Kasandra Knudsen, MD  ALPRAZolam Duanne Moron) 0.5 MG tablet Take 0.25 mg by mouth daily as needed for anxiety.    [provider]  apixaban (ELIQUIS) 5 MG TABS tablet Take 1 tablet (5 mg total) by mouth 2 (two) times daily. 02/13/22   Regalado, Belkys A, MD  busPIRone (BUSPAR) 5 MG tablet Take 5 mg by mouth 3 (three) times daily.  Patient not taking: Reported on 03/22/2022    [provider]  cefadroxil (DURICEF) 500 MG capsule Take 2 capsules (1,000 mg total) by mouth 2 (two) times daily. 03/14/22   Truman Hayward, MD  doxycycline (VIBRA-TABS) 100 MG tablet Take 1 tablet (100 mg total) by mouth 2 (two) times daily. 03/14/22   Truman Hayward, MD  feeding supplement (ENSURE ENLIVE / ENSURE PLUS) LIQD Take 237 mLs by mouth 2 (two) times daily between meals. 12/12/21   Mariel Aloe, MD  fluorometholone (FML) 0.1 % ophthalmic suspension Place 1 drop into both eyes 3 (three) times daily. 12/01/21   [provider]  fluticasone (FLOVENT HFA) 110 MCG/ACT inhaler Inhale 1 puff into the lungs 2 (two) times daily.    [provider]  furosemide (LASIX) 20 MG tablet Take 20 mg by mouth daily. Patient not taking: Reported on 03/22/2022    [provider]  insulin aspart (NOVOLOG FLEXPEN) 100 UNIT/ML FlexPen Inject 5 Units into the skin 3 (three) times daily with meals. 11/13/21   Linus Galas, MD  insulin degludec (TRESIBA  FLEXTOUCH) 200 UNIT/ML FlexTouch Pen Inject 26 Units into the skin at bedtime. 12/12/21   Mariel Aloe, MD  ipratropium-albuterol (DUONEB) 0.5-2.5 (3) MG/3ML SOLN Use 1 vial in nebulizer every 8 hours and as needed 01/13/22   Regalado, Belkys A, MD  levalbuterol (XOPENEX HFA) 45 MCG/ACT inhaler Inhale 1-2 puffs into the lungs every 6 (six) hours as needed for wheezing. 12/30/21   Deneise Lever, MD  metoprolol succinate (TOPROL-XL) 50 MG 24 hr tablet TAKE ONE TABLET TWICE A DAY WITH OR IMMEDIATELY FOLLOWING A MEAL. Patient taking differently: Take 50 mg by mouth 2 (two) times daily. 11/02/21   Wardell Honour, MD  Multiple Vitamin (MULTIVITAMIN WITH MINERALS) TABS tablet Take 1 tablet by mouth daily. 08/14/21   Pokhrel, Corrie Mckusick, MD  pantoprazole (PROTONIX) 40 MG tablet Take 1 tablet (40 mg total) by mouth daily. 08/13/21 08/13/22  Pokhrel, Corrie Mckusick, MD  pravastatin (PRAVACHOL) 20 MG tablet Take 1 tablet (20 mg total) by mouth daily. 02/28/21   Wardell Honour, MD  thiamine 100 MG tablet Take 1 tablet (100 mg total) by mouth daily. 08/14/21   Pokhrel, Corrie Mckusick, MD  Tiotropium Bromide-Olodaterol (STIOLTO RESPIMAT) 2.5-2.5 MCG/ACT AERS Inhale 2 puffs into the lungs daily. 10/20/21   Deneise Lever, MD    Physical Exam: Vitals:   04/04/22 1915 04/04/22 1945 04/04/22 2004 04/04/22 2211  BP: 125/63 131/64  (!) 146/63  Pulse: (!) 102 99  96  Resp: (!) 23 (!) 21  19  Temp:   98.3 F (36.8 C)   TempSrc:   Oral   SpO2: 90% 93%  98%  Weight:      Height:        Physical Exam Vitals and nursing note reviewed.  Constitutional:      General: She is not in acute distress.    Appearance: She is ill-appearing.     Comments: Chronically ill-appearing.  Cachectic appearing.  HENT:     Head: Normocephalic and atraumatic.  Cardiovascular:     Rate and Rhythm: Normal rate and regular rhythm.     Pulses: Normal pulses.  Pulmonary:     Comments: Coarse breath sounds bilaterally.  No wheezing.  No respiratory  distress. Abdominal:     General: Abdomen is flat. There is no distension.     Palpations: Abdomen is soft.     Tenderness: There is  no guarding or rebound.  Genitourinary:    Comments: Full catheter in place with turbid brown-colored urine.  See picture. Skin:    General: Skin is warm and dry.     Comments: Lateral aspect of the left foot on the volar surface near the base of the fifth metatarsal, there is a large brown callus noted.  Bedside RN reports pt with stage 1 sacral decub(present on admission)  Neurological:     Comments: Unresponsive to gentle physical stimuli.      Labs on Admission: I have personally reviewed following labs and imaging studies  CBC: Recent Labs  Lab 04/04/22 1140  WBC 18.8*  HGB 15.1*  HCT 47.0*  MCV 86.1  PLT 858   Basic Metabolic Panel: Recent Labs  Lab 04/04/22 1140  NA 133*  K 5.1  CL 96*  CO2 25  GLUCOSE 438*  BUN 31*  CREATININE 0.72  CALCIUM 9.4   GFR: Estimated Creatinine Clearance: 43.3 mL/min (by C-G formula based on SCr of 0.72 mg/dL). Liver Function Tests: Recent Labs  Lab 04/04/22 1816  AST 11*  ALT 7  ALKPHOS 59  BILITOT 0.8  PROT 6.3*  ALBUMIN 2.2*   Recent Labs  Lab 04/04/22 1816  LIPASE 25   No results for input(s): "AMMONIA" in the last 168 hours. Coagulation Profile: No results for input(s): "INR", "PROTIME" in the last 168 hours. Cardiac Enzymes: No results for input(s): "CKTOTAL", "CKMB", "CKMBINDEX", "TROPONINI", "TROPONINIHS" in the last 168 hours. BNP (last 3 results) No results for input(s): "PROBNP" in the last 8760 hours. HbA1C: No results for input(s): "HGBA1C" in the last 72 hours. CBG: Recent Labs  Lab 04/04/22 1804  GLUCAP 415*   Lipid Profile: No results for input(s): "CHOL", "HDL", "LDLCALC", "TRIG", "CHOLHDL", "LDLDIRECT" in the last 72 hours. Thyroid Function Tests: No results for input(s): "TSH", "T4TOTAL", "FREET4", "T3FREE", "THYROIDAB" in the last 72 hours. Anemia  Panel: No results for input(s): "VITAMINB12", "FOLATE", "FERRITIN", "TIBC", "IRON", "RETICCTPCT" in the last 72 hours. Urine analysis:    Component Value Date/Time   COLORURINE YELLOW 04/04/2022 2018   APPEARANCEUR TURBID (A) 04/04/2022 2018   LABSPEC 1.009 04/04/2022 2018   PHURINE 6.0 04/04/2022 2018   GLUCOSEU >=500 (A) 04/04/2022 2018   GLUCOSEU 100 (A) 05/11/2017 1015   HGBUR LARGE (A) 04/04/2022 2018   BILIRUBINUR NEGATIVE 04/04/2022 2018   BILIRUBINUR Negative 11/25/2021 1415   KETONESUR 5 (A) 04/04/2022 2018   PROTEINUR 100 (A) 04/04/2022 2018   UROBILINOGEN 0.2 11/25/2021 1415   UROBILINOGEN 0.2 05/11/2017 1015   NITRITE NEGATIVE 04/04/2022 2018   LEUKOCYTESUR LARGE (A) 04/04/2022 2018    Radiological Exams on Admission: I have personally reviewed images CT ABDOMEN PELVIS WO CONTRAST  Result Date: 04/04/2022 CLINICAL DATA:  Left lower quadrant abdominal pain. EXAM: CT ABDOMEN AND PELVIS WITHOUT CONTRAST TECHNIQUE: Multidetector CT imaging of the abdomen and pelvis was performed following the standard protocol without IV contrast. RADIATION DOSE REDUCTION: This exam was performed according to the departmental dose-optimization program which includes automated exposure control, adjustment of the mA and/or kV according to patient size and/or use of iterative reconstruction technique. COMPARISON:  November 10, 2021. FINDINGS: Lower chest: No acute abnormality. Hepatobiliary: No focal liver abnormality is seen. Status post cholecystectomy. No biliary dilatation. Pancreas: Unremarkable. No pancreatic ductal dilatation or surrounding inflammatory changes. Spleen: Normal in size without focal abnormality. Adrenals/Urinary Tract: Adrenal glands appear normal. Moderate bilateral hydroureteronephrosis is noted without obstructing calculus. This appears to be due to severe urinary  bladder distention. Stomach/Bowel: Stomach is unremarkable. There is no evidence of bowel obstruction or inflammation.  Vascular/Lymphatic: Aortic atherosclerosis. No enlarged abdominal or pelvic lymph nodes. Reproductive: Status post hysterectomy. No adnexal masses. Other: No abdominal wall hernia or abnormality. No abdominopelvic ascites. Musculoskeletal: Acute fracture is seen involving superior endplate of Y17 vertebral body. IMPRESSION: Severe urinary bladder distention is noted which results in moderate bilateral hydroureteronephrosis. Acute fracture seen involving superior endplate of C94 vertebral body. Aortic Atherosclerosis (ICD10-I70.0). Electronically Signed   By: Marijo Conception M.D.   On: 04/04/2022 17:49   DG Chest 2 View  Result Date: 04/04/2022 CLINICAL DATA:  Weakness and anorexia. EXAM: CHEST - 2 VIEW COMPARISON:  12/08/2021 FINDINGS: The pacer wires are stable. The heart is normal in size. Stable tortuosity and calcification of the thoracic aorta. No acute pulmonary findings. Stable eventration of the right hemidiaphragm peer the bony thorax is intact. IMPRESSION: No acute cardiopulmonary findings. Electronically Signed   By: Marijo Sanes M.D.   On: 04/04/2022 15:06    EKG: My personal interpretation of EKG shows: no EKG to review    Assessment/Plan Principal Problem:   Acute cystitis without hematuria Active Problems:   Acute metabolic encephalopathy   Sepsis without acute organ dysfunction (HCC)   Acute urinary retention   T12 vertebral fracture (HCC)   Uncontrolled type 2 diabetes mellitus with hyperglycemia (HCC)   Chronic respiratory failure with hypoxia (HCC) - home O2 of 2-3 L/min   Pacemaker   Protein-calorie malnutrition, severe    Assessment and Plan: * Acute cystitis without hematuria Admit to med/surg bed. IV rocephin for UTI. Urine cx and blood cx sent. IVF overnight.  T12 vertebral fracture (HCC) Needs MRI lumbar/thoracic spine..this has been ordered but will not be completed until tomorrow due to pt's PPM.  Acute urinary retention Had urinary retention requiring  foley. Had more than 1700 ml in her bladder. Given her new T12 fracture, MRI lumbar/thoracic spine ordered. However, given that pt has PPM, the MRI cannot be performed until tomorrow.  Sepsis without acute organ dysfunction (HCC) Fulfills sepsis criteria due to WBC 18k, HR 103 with UTI.  Acute metabolic encephalopathy Probably due to her acute cystitis.  Protein-calorie malnutrition, severe Check pre-albumin.  Pacemaker Chronic.  Chronic respiratory failure with hypoxia (HCC) - home O2 of 2-3 L/min Chronic. Per EMR, pt suppose to be on chronic hypoxia.  Uncontrolled type 2 diabetes mellitus with hyperglycemia (HCC) Add SSI. Check A1c.     DVT prophylaxis: SQ Heparin Code Status: Full Code by default Family Communication: no family members at bedside  Disposition Plan: return home vs SNF  Consults called: none  Admission status: Inpatient, Med-Surg   Kristopher Oppenheim, DO [facility] 04/04/2022, 10:32 PM

## 2022-04-04 NOTE — ED Notes (Signed)
Caretaker called to check on pt and states pt is diabetic and on antibiotics for infection in her foot near small toe and pt was supposed to go to wound care center today.  Pt has refused to eat for 3 days.  Pt has dark urine several days.  Pt also has skin breakdown on pts buttocks. Pt has sore area on her hip also.  [name] -[phone]

## 2022-04-04 NOTE — Assessment & Plan Note (Addendum)
-   Leukocytosis, tachycardia, source is suspected were urinary and foot wound on admission - See separate problems - Sepsis physiology resolved

## 2022-04-04 NOTE — Assessment & Plan Note (Addendum)
-   Patient's BMI is Body mass index is 24.21 kg/m.. - Patient has the following signs/symptoms consistent with PCM: (fat loss, muscle loss, muscle wasting). - seen by RD, appreciate assistance. Continue plan per RD

## 2022-04-04 NOTE — Assessment & Plan Note (Addendum)
-   Completed course for UTI

## 2022-04-04 NOTE — ED Notes (Signed)
Pt refusing Ct exam at this time. Provider messaged.

## 2022-04-05 ENCOUNTER — Inpatient Hospital Stay (HOSPITAL_COMMUNITY): Payer: Medicare Other

## 2022-04-05 ENCOUNTER — Other Ambulatory Visit: Payer: Self-pay | Admitting: Family Medicine

## 2022-04-05 ENCOUNTER — Encounter (HOSPITAL_COMMUNITY): Payer: Self-pay | Admitting: Internal Medicine

## 2022-04-05 LAB — CBC WITH DIFFERENTIAL/PLATELET
Abs Immature Granulocytes: 0.09 10*3/uL — ABNORMAL HIGH (ref 0.00–0.07)
Basophils Absolute: 0.1 10*3/uL (ref 0.0–0.1)
Basophils Relative: 1 %
Eosinophils Absolute: 0.1 10*3/uL (ref 0.0–0.5)
Eosinophils Relative: 1 %
HCT: 46.4 % — ABNORMAL HIGH (ref 36.0–46.0)
Hemoglobin: 14.2 g/dL (ref 12.0–15.0)
Immature Granulocytes: 1 %
Lymphocytes Relative: 14 %
Lymphs Abs: 1.6 10*3/uL (ref 0.7–4.0)
MCH: 26.9 pg (ref 26.0–34.0)
MCHC: 30.6 g/dL (ref 30.0–36.0)
MCV: 88 fL (ref 80.0–100.0)
Monocytes Absolute: 0.6 10*3/uL (ref 0.1–1.0)
Monocytes Relative: 5 %
Neutro Abs: 9.4 10*3/uL — ABNORMAL HIGH (ref 1.7–7.7)
Neutrophils Relative %: 78 %
Platelets: 246 10*3/uL (ref 150–400)
RBC: 5.27 MIL/uL — ABNORMAL HIGH (ref 3.87–5.11)
RDW: 14.4 % (ref 11.5–15.5)
WBC: 11.8 10*3/uL — ABNORMAL HIGH (ref 4.0–10.5)
nRBC: 0 % (ref 0.0–0.2)

## 2022-04-05 LAB — BLOOD CULTURE ID PANEL (REFLEXED) - BCID2
A.calcoaceticus-baumannii: NOT DETECTED
Bacteroides fragilis: NOT DETECTED
Candida albicans: NOT DETECTED
Candida auris: NOT DETECTED
Candida glabrata: NOT DETECTED
Candida krusei: NOT DETECTED
Candida parapsilosis: NOT DETECTED
Candida tropicalis: NOT DETECTED
Cryptococcus neoformans/gattii: NOT DETECTED
Enterobacter cloacae complex: NOT DETECTED
Enterobacterales: NOT DETECTED
Enterococcus Faecium: NOT DETECTED
Enterococcus faecalis: DETECTED — AB
Escherichia coli: NOT DETECTED
Haemophilus influenzae: NOT DETECTED
Klebsiella aerogenes: NOT DETECTED
Klebsiella oxytoca: NOT DETECTED
Klebsiella pneumoniae: NOT DETECTED
Listeria monocytogenes: NOT DETECTED
Neisseria meningitidis: NOT DETECTED
Proteus species: NOT DETECTED
Pseudomonas aeruginosa: NOT DETECTED
Salmonella species: NOT DETECTED
Serratia marcescens: NOT DETECTED
Staphylococcus aureus (BCID): NOT DETECTED
Staphylococcus epidermidis: NOT DETECTED
Staphylococcus lugdunensis: NOT DETECTED
Staphylococcus species: NOT DETECTED
Stenotrophomonas maltophilia: NOT DETECTED
Streptococcus agalactiae: NOT DETECTED
Streptococcus pneumoniae: NOT DETECTED
Streptococcus pyogenes: NOT DETECTED
Streptococcus species: DETECTED — AB
Vancomycin resistance: NOT DETECTED

## 2022-04-05 LAB — COMPREHENSIVE METABOLIC PANEL
ALT: 9 U/L (ref 0–44)
AST: 9 U/L — ABNORMAL LOW (ref 15–41)
Albumin: 2.5 g/dL — ABNORMAL LOW (ref 3.5–5.0)
Alkaline Phosphatase: 63 U/L (ref 38–126)
Anion gap: 12 (ref 5–15)
BUN: 31 mg/dL — ABNORMAL HIGH (ref 8–23)
CO2: 25 mmol/L (ref 22–32)
Calcium: 9 mg/dL (ref 8.9–10.3)
Chloride: 98 mmol/L (ref 98–111)
Creatinine, Ser: 0.85 mg/dL (ref 0.44–1.00)
GFR, Estimated: >60 mL/min (ref >60)
Glucose, Bld: 391 mg/dL — ABNORMAL HIGH (ref 70–99)
Potassium: 4.7 mmol/L (ref 3.5–5.1)
Sodium: 135 mmol/L (ref 135–145)
Total Bilirubin: 0.8 mg/dL (ref 0.3–1.2)
Total Protein: 6.9 g/dL (ref 6.5–8.1)

## 2022-04-05 LAB — URINE CULTURE: Culture: NO GROWTH

## 2022-04-05 LAB — LACTIC ACID, PLASMA: Lactic Acid, Venous: 1.9 mmol/L (ref 0.5–1.9)

## 2022-04-05 LAB — GLUCOSE, CAPILLARY
Glucose-Capillary: 183 mg/dL — ABNORMAL HIGH (ref 70–99)
Glucose-Capillary: 197 mg/dL — ABNORMAL HIGH (ref 70–99)
Glucose-Capillary: 252 mg/dL — ABNORMAL HIGH (ref 70–99)
Glucose-Capillary: 257 mg/dL — ABNORMAL HIGH (ref 70–99)
Glucose-Capillary: 284 mg/dL — ABNORMAL HIGH (ref 70–99)
Glucose-Capillary: 387 mg/dL — ABNORMAL HIGH (ref 70–99)

## 2022-04-05 LAB — MAGNESIUM: Magnesium: 1.8 mg/dL (ref 1.7–2.4)

## 2022-04-05 LAB — PREALBUMIN: Prealbumin: 5 mg/dL — ABNORMAL LOW (ref 18–38)

## 2022-04-05 MED ORDER — IPRATROPIUM-ALBUTEROL 0.5-2.5 (3) MG/3ML IN SOLN
3.0000 mL | Freq: Three times a day (TID) | RESPIRATORY_TRACT | Status: DC
  Administered 2022-04-05: 3 mL via RESPIRATORY_TRACT
  Filled 2022-04-05: qty 3

## 2022-04-05 MED ORDER — IPRATROPIUM-ALBUTEROL 0.5-2.5 (3) MG/3ML IN SOLN: 3.0000 mL | Freq: Three times a day (TID) | RESPIRATORY_TRACT | Status: DC

## 2022-04-05 MED ORDER — LACTATED RINGERS IV SOLN: INTRAVENOUS | Status: AC

## 2022-04-05 MED ORDER — IPRATROPIUM-ALBUTEROL 0.5-2.5 (3) MG/3ML IN SOLN
3.0000 mL | Freq: Two times a day (BID) | RESPIRATORY_TRACT | Status: DC
  Administered 2022-04-05 – 2022-04-27 (×39): 3 mL via RESPIRATORY_TRACT
  Filled 2022-04-05 (×43): qty 3

## 2022-04-05 MED ORDER — INSULIN ASPART 100 UNIT/ML IJ SOLN
0.0000 [IU] | INTRAMUSCULAR | Status: DC
  Administered 2022-04-05: 2 [IU] via SUBCUTANEOUS
  Administered 2022-04-05: 5 [IU] via SUBCUTANEOUS
  Administered 2022-04-05: 2 [IU] via SUBCUTANEOUS
  Administered 2022-04-05: 5 [IU] via SUBCUTANEOUS
  Administered 2022-04-05: 9 [IU] via SUBCUTANEOUS
  Administered 2022-04-05 – 2022-04-06 (×3): 5 [IU] via SUBCUTANEOUS
  Administered 2022-04-06: 3 [IU] via SUBCUTANEOUS
  Administered 2022-04-06 (×2): 5 [IU] via SUBCUTANEOUS
  Administered 2022-04-07: 7 [IU] via SUBCUTANEOUS
  Administered 2022-04-07: 9 [IU] via SUBCUTANEOUS
  Administered 2022-04-07 (×2): 3 [IU] via SUBCUTANEOUS
  Administered 2022-04-07 – 2022-04-08 (×2): 7 [IU] via SUBCUTANEOUS
  Administered 2022-04-08: 2 [IU] via SUBCUTANEOUS
  Administered 2022-04-08: 3 [IU] via SUBCUTANEOUS
  Administered 2022-04-08: 5 [IU] via SUBCUTANEOUS
  Administered 2022-04-08: 3 [IU] via SUBCUTANEOUS
  Administered 2022-04-08: 5 [IU] via SUBCUTANEOUS
  Administered 2022-04-08: 2 [IU] via SUBCUTANEOUS
  Administered 2022-04-09: 5 [IU] via SUBCUTANEOUS
  Administered 2022-04-09: 2 [IU] via SUBCUTANEOUS
  Administered 2022-04-09 (×2): 5 [IU] via SUBCUTANEOUS
  Administered 2022-04-09: 7 [IU] via SUBCUTANEOUS
  Administered 2022-04-10: 1 [IU] via SUBCUTANEOUS
  Administered 2022-04-10: 2 [IU] via SUBCUTANEOUS
  Administered 2022-04-10: 7 [IU] via SUBCUTANEOUS
  Administered 2022-04-10: 3 [IU] via SUBCUTANEOUS
  Administered 2022-04-10 (×2): 5 [IU] via SUBCUTANEOUS
  Administered 2022-04-11: 2 [IU] via SUBCUTANEOUS
  Administered 2022-04-11: 3 [IU] via SUBCUTANEOUS
  Administered 2022-04-11: 2 [IU] via SUBCUTANEOUS
  Administered 2022-04-11: 5 [IU] via SUBCUTANEOUS
  Administered 2022-04-12 (×2): 3 [IU] via SUBCUTANEOUS
  Administered 2022-04-12: 2 [IU] via SUBCUTANEOUS
  Administered 2022-04-12: 3 [IU] via SUBCUTANEOUS
  Administered 2022-04-12: 2 [IU] via SUBCUTANEOUS
  Administered 2022-04-13: 3 [IU] via SUBCUTANEOUS
  Administered 2022-04-13: 2 [IU] via SUBCUTANEOUS
  Administered 2022-04-13: 3 [IU] via SUBCUTANEOUS

## 2022-04-05 MED ORDER — BUDESONIDE 0.25 MG/2ML IN SUSP
0.2500 mg | Freq: Two times a day (BID) | RESPIRATORY_TRACT | Status: DC
  Administered 2022-04-05 – 2022-04-27 (×40): 0.25 mg via RESPIRATORY_TRACT
  Filled 2022-04-05 (×44): qty 2

## 2022-04-05 MED ORDER — CHLORHEXIDINE GLUCONATE CLOTH 2 % EX PADS
6.0000 | MEDICATED_PAD | Freq: Every day | CUTANEOUS | Status: DC
  Administered 2022-04-05 – 2022-04-15 (×10): 6 via TOPICAL

## 2022-04-05 MED ORDER — INSULIN GLARGINE-YFGN 100 UNIT/ML ~~LOC~~ SOLN
10.0000 [IU] | Freq: Every day | SUBCUTANEOUS | Status: DC
  Administered 2022-04-05 – 2022-04-09 (×6): 10 [IU] via SUBCUTANEOUS
  Filled 2022-04-05 (×8): qty 0.1

## 2022-04-05 MED ORDER — ONDANSETRON HCL 4 MG/2ML IJ SOLN
4.0000 mg | Freq: Four times a day (QID) | INTRAMUSCULAR | Status: DC | PRN
  Administered 2022-04-06 – 2022-04-25 (×6): 4 mg via INTRAVENOUS
  Filled 2022-04-05 (×6): qty 2

## 2022-04-05 MED ORDER — SODIUM CHLORIDE 0.9 % IV SOLN
3.0000 g | Freq: Four times a day (QID) | INTRAVENOUS | Status: DC
  Administered 2022-04-05 – 2022-04-12 (×27): 3 g via INTRAVENOUS
  Filled 2022-04-05 (×24): qty 8

## 2022-04-05 MED ORDER — SODIUM CHLORIDE 0.9 % IV SOLN
1.0000 g | INTRAVENOUS | Status: DC
  Administered 2022-04-05: 1 g via INTRAVENOUS
  Filled 2022-04-05: qty 10

## 2022-04-05 MED ORDER — HEPARIN SODIUM (PORCINE) 5000 UNIT/ML IJ SOLN
5000.0000 [IU] | Freq: Three times a day (TID) | INTRAMUSCULAR | Status: DC
  Administered 2022-04-05 – 2022-04-27 (×66): 5000 [IU] via SUBCUTANEOUS
  Filled 2022-04-05 (×66): qty 1

## 2022-04-05 MED ORDER — GLUCERNA SHAKE PO LIQD
237.0000 mL | Freq: Two times a day (BID) | ORAL | Status: DC
  Administered 2022-04-05 – 2022-04-10 (×5): 237 mL via ORAL
  Filled 2022-04-05 (×13): qty 237

## 2022-04-05 MED ORDER — ENSURE MAX PROTEIN PO LIQD
11.0000 [oz_av] | Freq: Every day | ORAL | Status: DC
  Administered 2022-04-06 – 2022-04-08 (×2): 11 [oz_av] via ORAL
  Filled 2022-04-05 (×6): qty 330

## 2022-04-05 MED ORDER — MEDIHONEY WOUND/BURN DRESSING EX PSTE
1.0000 | PASTE | Freq: Every day | CUTANEOUS | Status: AC
  Administered 2022-04-05 – 2022-04-18 (×14): 1 via TOPICAL
  Filled 2022-04-05: qty 44

## 2022-04-05 MED ORDER — ACETAMINOPHEN 650 MG RE SUPP: 650.0000 mg | RECTAL | Status: DC | PRN

## 2022-04-05 NOTE — Progress Notes (Signed)
Contacted [employer] medical representative for 2pm MRI time. Rep has to be on sight for pt to be safely scanned with her pacemaker. RN called back later stating pt was currently refusing MRI orders. Rep called and notified to cx appt for 2PM table time. No longer needed due to refusal

## 2022-04-05 NOTE — Progress Notes (Signed)
PHARMACY - PHYSICIAN COMMUNICATION CRITICAL VALUE ALERT - BLOOD CULTURE IDENTIFICATION (BCID)  [name] is an 86 y.o. female who presented to [facility] on 04/04/2022 with a chief complaint of encephalopathy, urosepsis  Assessment:  1/4 Blood cultures with GPC and GPC in chains.  BCID shows both Streptococcus species, and Enterococcus faecalis, Vancomycin resistance not detected.  She is currently being treated for Urosepsis.    Name of physician (or Provider) Contacted: Dr Williams Che  Current antibiotics: Ceftriaxone  Changes to prescribed antibiotics recommended:  Recommendations accepted by provider Change to Unasyn 3g IV q6h. ID to follow up with auto consult for Enterococcus bacteremia.    Results for orders placed or performed during the hospital encounter of 04/04/22  Blood Culture ID Panel (Reflexed) (Collected: 04/04/2022  6:16 PM)  Result Value Ref Range   Enterococcus faecalis DETECTED (A) NOT DETECTED   Enterococcus Faecium NOT DETECTED NOT DETECTED   Listeria monocytogenes NOT DETECTED NOT DETECTED   Staphylococcus species NOT DETECTED NOT DETECTED   Staphylococcus aureus (BCID) NOT DETECTED NOT DETECTED   Staphylococcus epidermidis NOT DETECTED NOT DETECTED   Staphylococcus lugdunensis NOT DETECTED NOT DETECTED   Streptococcus species DETECTED (A) NOT DETECTED   Streptococcus agalactiae NOT DETECTED NOT DETECTED   Streptococcus pneumoniae NOT DETECTED NOT DETECTED   Streptococcus pyogenes NOT DETECTED NOT DETECTED   A.calcoaceticus-baumannii NOT DETECTED NOT DETECTED   Bacteroides fragilis NOT DETECTED NOT DETECTED   Enterobacterales NOT DETECTED NOT DETECTED   Enterobacter cloacae complex NOT DETECTED NOT DETECTED   Escherichia coli NOT DETECTED NOT DETECTED   Klebsiella aerogenes NOT DETECTED NOT DETECTED   Klebsiella oxytoca NOT DETECTED NOT DETECTED   Klebsiella pneumoniae NOT DETECTED NOT DETECTED   Proteus species NOT DETECTED NOT DETECTED   Salmonella  species NOT DETECTED NOT DETECTED   Serratia marcescens NOT DETECTED NOT DETECTED   Haemophilus influenzae NOT DETECTED NOT DETECTED   Neisseria meningitidis NOT DETECTED NOT DETECTED   Pseudomonas aeruginosa NOT DETECTED NOT DETECTED   Stenotrophomonas maltophilia NOT DETECTED NOT DETECTED   Candida albicans NOT DETECTED NOT DETECTED   Candida auris NOT DETECTED NOT DETECTED   Candida glabrata NOT DETECTED NOT DETECTED   Candida krusei NOT DETECTED NOT DETECTED   Candida parapsilosis NOT DETECTED NOT DETECTED   Candida tropicalis NOT DETECTED NOT DETECTED   Cryptococcus neoformans/gattii NOT DETECTED NOT DETECTED   Vancomycin resistance NOT DETECTED NOT DETECTED    Gretta Arab PharmD, BCPS [facility] main pharmacy [phone] 04/05/2022 4:08 PM

## 2022-04-05 NOTE — Progress Notes (Signed)
Initial Nutrition Assessment  DOCUMENTATION CODES:   Severe malnutrition in context of chronic illness  INTERVENTION:  - Continue DYS 3 diet as medically appropriate.  - Ensure Max once daily, provides 150 kcal and 30 grams of protein.  - Glucerna Shake BID, each supplement provides 220 kcal and 10 grams of protein - Encourage intake at all meals and of supplements.  - Monitor weights daily to trend.    NUTRITION DIAGNOSIS:   Severe Malnutrition related to chronic illness as evidenced by severe fat depletion, severe muscle depletion, 31% percent weight loss in 1 year.  GOAL:   Patient will meet greater than or equal to 90% of their needs  MONITOR:   PO intake, Supplement acceptance, Weight trends  REASON FOR ASSESSMENT:   Malnutrition Screening Tool    ASSESSMENT:   86 yo WF with hx of uncontrolled type 2 DM, history of complete heart block status post pacemaker, chronic protein calorie malnutrition, chronic respiratory failure on home oxygen to 3 L admitted acute cystitis without hematuria, T12 vertebral fracture, sepsis, and acute metabolic encephalopathy.   Patient eating lunch at time of visit. She reports she is unsure of a UBW but knows she has had weight loss within the past year. Unsure of a cause of weight loss. Per EMR, patient weighed at 153# in December 2022 and weighed this admission at 105# - 48# or 31% weight loss in 1 year, severe.  Patient endorses good appetite at baseline but admits she usually snacks throughout the day as opposed to having a set number of meals. Drinks 1 protein shake a day at home but unable to recall the name or brand.  Patient's current appetite is good and she is noted to have finished 50% of her lunch after visit. Patient agreeable to receive nutrition supplements to support intake during admission. Encouraged intake of 3 meals a day plus nutrition supplements to support healthy weight gain.   Medications reviewed and include:  Insulin  Labs reviewed:  HA1C 13.2 Blood Glucose 183-415 x24 hours   NUTRITION - FOCUSED PHYSICAL EXAM:  Flowsheet Row Most Recent Value  Orbital Region Severe depletion  Upper Arm Region Severe depletion  Thoracic and Lumbar Region Severe depletion  Buccal Region Severe depletion  Temple Region Severe depletion  Clavicle Bone Region Severe depletion  Clavicle and Acromion Bone Region Severe depletion  Scapular Bone Region Unable to assess  Dorsal Hand Severe depletion  Patellar Region Severe depletion  Anterior Thigh Region Severe depletion  Posterior Calf Region Severe depletion  Edema (RD Assessment) None  Hair Reviewed  Eyes Reviewed  Mouth Reviewed  Skin Reviewed  Nails Reviewed       Diet Order:   Diet Order             DIET DYS 3 Room service appropriate? Yes; Fluid consistency: Thin  Diet effective now                   EDUCATION NEEDS:  No education needs have been identified at this time  Skin:  Skin Assessment: Skin Integrity Issues: Skin Integrity Issues:: Stage II, Unstageable, Stage I Stage I: Mid Sacrum Stage II: L Hip Unstageable: L Toe  Last BM:  PTA  Height:  Ht Readings from Last 1 Encounters:  04/05/22 5' 3"  (1.6 m)   Weight:  Wt Readings from Last 1 Encounters:  04/05/22 47.5 kg   Ideal Body Weight:     BMI:  Body mass index is 18.55 kg/m.  Estimated Nutritional Needs:  Kcal:  1650-1900 kcal Protein:  70-95 grams Fluid:  >/= 1.6L   Samson Frederic RD, LDN For contact information, refer to [facility].

## 2022-04-05 NOTE — Progress Notes (Signed)
Pt refused MRI. Pt cannot tolerate laying flat and seem irritated that she had to get it done.

## 2022-04-05 NOTE — Progress Notes (Signed)
Consultation Progress Note   Patient: [name]:[id number] DOB: [birth year] DOA: 04/04/2022 DOS: the patient was seen and examined on 04/05/2022 Primary service: DibiaManfred Shirts, MD  Brief hospital course: 86 yo WF with hx of uncontrolled type 2 DM, history of complete heart block status post pacemaker, chronic protein calorie malnutrition, chronic respiratory failure on home oxygen to 3 L admit presented to the ER today via EMS.  Patient unable to give any history review of systems.   Per EMS, patient was hypoglycemic,and was been treated for UTI outpatient.   Assessment and Plan: * Acute cystitis without hematuria Admit to med/surg bed. IV rocephin for UTI. Urine cx and blood cx sent. Continued on  IVF  T12 vertebral fracture (HCC) Pending MRI lumbar/thoracic spine. Likely to be completed today Acute urinary retention Had urinary retention requiring foley. Had more than 1700 ml in her bladder. Given her new T12 fracture, MRI lumbar/thoracic spine ordered. However, given that pt has PPM, the MRI cannot be performed until today  Sepsis without acute organ dysfunction [facility]) Fulfills sepsis criteria due to WBC 18k, HR 103 with UTI on admission WBC count is improving, now at 11.  Acute metabolic encephalopathy-improved Probably due to her acute cystitis.  Protein-calorie malnutrition, severe Would need dietician consult  Pacemaker Chronic.  Chronic respiratory failure with hypoxia (HCC) - home O2 of 2-3 L/min Chronic. Per EMR, pt suppose to be on chronic hypoxia.  Uncontrolled type 2 diabetes mellitus with hyperglycemia (HCC) Add SSI.         TRH will continue to follow the patient.  Subjective: More alert this morning.  Physical Exam: Constitutional:      General: She is not in acute distress.    Appearance: Cachectic, chronically ill appearing HENT:     Head: Normocephalic and atraumatic.  Cardiovascular:     Rate and Rhythm: Normal rate and regular  rhythm.     Pulses: Normal pulses.  Pulmonary:     Comments: Coarse breath sound, no wheezing, rhonchi Abdominal:     General: Abdomen is flat. There is no distension.     Palpations: Abdomen is soft.     Tenderness: There is no guarding or rebound.  Genitourinary:    Comments: Foley draining urine Skin:    General: Skin is warm and dry.     Comments: Lateral aspect of the left foot on the volar surface near the base of the fifth metatarsal, there is a large brown callus noted.  Neurological:     Comments: Alert Vitals:   04/05/22 0034 04/05/22 0348 04/05/22 0415 04/05/22 0742  BP: (!) 137/55 137/69    Pulse: 94 96    Resp: 20 16    Temp: 98.2 F (36.8 C) 98.8 F (37.1 C)    TempSrc: Axillary     SpO2: 93% 94%  96%  Weight: 47.5 kg  47.5 kg   Height: 5' 3"  (1.6 m)       Data Reviewed:  There are no new results to review at this time.  Family Communication:   Time spent: 15 minutes.  Author: Cristela Felt, MD 04/05/2022 9:35 AM  For on call review [email or web address].

## 2022-04-05 NOTE — Progress Notes (Signed)
Informed secretary to order air mattress.  Report called to [place] on 4W.

## 2022-04-05 NOTE — Consult Note (Signed)
WOC Nurse Consult Note: Reason for Consult: Stage 1 sacrum Noted on nursing flow sheets and image review to have left hip wound. Patient from home with UTI and weakness. Has foot wound that is reported in notes to be callous, to be seen in wound care center as well soon  Wound type: Unstageable pressure injury; left hip Callous left lateral foot Stage 1 pressure injury; sacral region  Pressure Injury POA: Yes Measurement: Left hip: 1cm x 1cm x 0.2cm  Sacrum: 5cm x 5cm x 0cm  Left lateral foot 2cm x 1cm x 0cm  Wound bed:  Left hip: 50% slough/50% pink Sacrum: intact, red  Left lateral foot; callous  Drainage (amount, consistency, odor) see nursing flow sheet Periwound:intact  Dressing procedure/placement/frequency:  1. Cleanse hip wound with saline, pat dry 2. Apply Medihoney to wound bed, top with saline moist 2x2 gauzse (not soaked, just damp) 3. Cover with foam dressing Ok to peel back foam daily to change gauze and reapply Medihoney   Add low air loss mattress for high risk patient Silicone foam to the sacrum; turn and reposition at least every 2 hours Nutritional supplementation per RD  Discussed POC with bedside nurse.  Re consult if needed, will not follow at this time. Thanks  Gevin Perea [employer], RN,CWOCN, CNS, [place] [phone])

## 2022-04-06 ENCOUNTER — Inpatient Hospital Stay (HOSPITAL_COMMUNITY): Payer: Medicare Other

## 2022-04-06 DIAGNOSIS — R7881 Bacteremia: Secondary | ICD-10-CM

## 2022-04-06 DIAGNOSIS — N3000 Acute cystitis without hematuria: Secondary | ICD-10-CM | POA: Diagnosis not present

## 2022-04-06 LAB — BLOOD CULTURE ID PANEL (REFLEXED) - BCID2

## 2022-04-06 LAB — ECHOCARDIOGRAM COMPLETE
Area-P 1/2: 4.89 cm2
Calc EF: 49.1 %
Height: 63 in
MV VTI: 1.79 cm2
S' Lateral: 2.9 cm
Single Plane A2C EF: 48.8 %
Single Plane A4C EF: 50 %
Weight: 1675.5 oz

## 2022-04-06 LAB — GLUCOSE, CAPILLARY
Glucose-Capillary: 196 mg/dL — ABNORMAL HIGH (ref 70–99)
Glucose-Capillary: 209 mg/dL — ABNORMAL HIGH (ref 70–99)
Glucose-Capillary: 260 mg/dL — ABNORMAL HIGH (ref 70–99)
Glucose-Capillary: 273 mg/dL — ABNORMAL HIGH (ref 70–99)
Glucose-Capillary: 277 mg/dL — ABNORMAL HIGH (ref 70–99)
Glucose-Capillary: 290 mg/dL — ABNORMAL HIGH (ref 70–99)

## 2022-04-06 LAB — CBC
HCT: 41.2 % (ref 36.0–46.0)
Hemoglobin: 12.9 g/dL (ref 12.0–15.0)
MCH: 27.1 pg (ref 26.0–34.0)
MCHC: 31.3 g/dL (ref 30.0–36.0)
MCV: 86.6 fL (ref 80.0–100.0)
Platelets: 197 10*3/uL (ref 150–400)
RBC: 4.76 MIL/uL (ref 3.87–5.11)
RDW: 14.2 % (ref 11.5–15.5)
WBC: 6.8 10*3/uL (ref 4.0–10.5)
nRBC: 0 % (ref 0.0–0.2)

## 2022-04-06 LAB — HEMOGLOBIN A1C
Hgb A1c MFr Bld: 12.9 % — ABNORMAL HIGH (ref 4.8–5.6)
Mean Plasma Glucose: 324 mg/dL

## 2022-04-06 LAB — BASIC METABOLIC PANEL
Anion gap: 9 (ref 5–15)
BUN: 16 mg/dL (ref 8–23)
CO2: 28 mmol/L (ref 22–32)
Calcium: 8.5 mg/dL — ABNORMAL LOW (ref 8.9–10.3)
Chloride: 98 mmol/L (ref 98–111)
Creatinine, Ser: 0.62 mg/dL (ref 0.44–1.00)
GFR, Estimated: >60 mL/min (ref >60)
Glucose, Bld: 211 mg/dL — ABNORMAL HIGH (ref 70–99)
Potassium: 3.9 mmol/L (ref 3.5–5.1)
Sodium: 135 mmol/L (ref 135–145)

## 2022-04-06 MED ORDER — PERFLUTREN LIPID MICROSPHERE
1.0000 mL | INTRAVENOUS | Status: AC | PRN
  Administered 2022-04-06: 2 mL via INTRAVENOUS

## 2022-04-06 NOTE — Progress Notes (Signed)
  Echocardiogram 2D Echocardiogram has been performed.  [name] 04/06/2022, 10:27 AM

## 2022-04-06 NOTE — Progress Notes (Signed)
  Progress Note   Patient: [name] CEY:[id number] DOB: 1936/05/04 DOA: 04/04/2022     2 DOS: the patient was seen and examined on 04/06/2022   Brief hospital course: 86 yo WF with hx of uncontrolled type 2 DM, history of complete heart block status post pacemaker, chronic protein calorie malnutrition, chronic respiratory failure on home oxygen to 3 L admit presented to the ER today via EMS.  Patient unable to give any history review of systems.    Per EMS, patient was hypoglycemic,and was been treated for UTI outpatient.     Assessment and Plan: Bacteremia -Blood cx from 11/28 growing Enterococcus fecalis and Strep spp. Repeat BC obtained, ID following. Patient on Unasyn  Acute cystitis without hematuria Urine cx shows no growth.  T12 vertebral fracture (HCC) Unable to get MRI due to PPM, would check CT scan of thoracic spine .  Acute urinary retention Had urinary retention requiring foley. Had more than 1700 ml in her bladder. Given her new T12 fracture, MRI lumbar/thoracic spine ordered. However, given that pt has PPM, would order CT imaging of her Thoracic spine.  Sepsis without acute organ dysfunction (HCC) Fulfills sepsis criteria on presentation due to WBC 18k, HR 103 with UTI.  Acute metabolic encephalopathy Probably due to her acute cystitis.  Protein-calorie malnutrition, severe Dietician following. On supplements  Pacemaker Chronic.  Chronic respiratory failure with hypoxia (HCC) - home O2 of 2-3 L/min Chronic. Per EMR, pt suppose to be on chronic hypoxia.  Uncontrolled type 2 diabetes mellitus with hyperglycemia ([place]) Continue SSI       Subjective: Seen this morning, more alert, Requesting for breakfast. She had no new complaints  Physical Exam: Vitals:   04/05/22 2143 04/06/22 0146 04/06/22 0420 04/06/22 0725  BP:  113/60    Pulse: (!) 105 88    Resp:  18    Temp:  98 F (36.7 C) 98 F (36.7 C)   TempSrc:  Oral Oral   SpO2: (!) 76% 97%   96%  Weight:      Height:       Constitutional:      General: She is not in acute distress.    Appearance: Cachectic, chronically ill appearing HENT:     Head: Normocephalic and atraumatic.  Cardiovascular:     Rate and Rhythm: Normal rate and regular rhythm.     Pulses: Normal pulses.  Pulmonary:     Comments: Coarse breath sound, no wheezing, rhonchi Abdominal:     General: Abdomen is flat. There is no distension.     Palpations: Abdomen is soft.     Tenderness: There is no guarding or rebound.  Genitourinary:    Comments: Foley draining urine Skin:    General: Skin is warm and dry.     Comments: Lateral aspect of the left foot on the volar surface near the base of the fifth metatarsal, there is a large brown callus noted.   Neurological:     Comments: Alert Data Reviewed:  There are no new results to review at this time.  Family Communication: None at bedside  Disposition: Status is: Inpatient Remains inpatient appropriate   Planned Discharge Destination: Home    Time spent: 15 minutes  Author: Cristela Felt, MD 04/06/2022 11:22 AM  For on call review [email or web address].

## 2022-04-06 NOTE — Plan of Care (Signed)
Patient awake and snacking on graham crackers and milk.  Bgl covered and discussed with patient. Limited understanding expressed, more concerned about needle stick than elevated bgl.

## 2022-04-06 NOTE — Consult Note (Addendum)
[facility]                                                                                        Patient Identification: Patient Name: [name] MRN: [id number] [place] Date: 04/04/2022 10:45 AM Today's Date: 04/06/2022 Reason for consult: bacteremia  Requesting provider: [facility] auto consult   Principal Problem:   Acute cystitis without hematuria Active Problems:   Uncontrolled type 2 diabetes mellitus with hyperglycemia ([place])   Chronic respiratory failure with hypoxia ([place]) - home O2 of 2-3 L/min   Acute metabolic encephalopathy   Pacemaker   Sepsis without acute organ dysfunction ([place])   Protein-calorie malnutrition, severe   Acute urinary retention   T12 vertebral fracture (HCC)   Antibiotics:  Ceftriaxone 11/28-c Unasyn 11/29-c  Lines/Hardware: PPM +  Assessment 86 year old female with PMH as below including 2 DM, complete heart block status post PPM, chronic PEM, chronic respiratory failure on home 3 L oxygen , chronic wounds including left metatarsal head osteomyelitis followed by Dr. Drucilla Schmidt on doxycycline and cefadroxil who was brought to the ED on 11/28 with weakness, nausea, lower abdominal pain, diarrhea and poor p.o. intake for a week.  Now with    Polymicrobial bacteremia ( strep , Enetrococcus faecalis, GPC in clusters in aerobic spp which is not identified by BCID)  11/28 18:16 Blood cx ( unspecified site) E faecalis ( GPC in chains in anaerobic bottles and GPC in clusters in aerobic bottle). Patient had BCID run on blood sample from 11/28 1816 hrs on 11/30 at 5:43 ( no staph spp detected.). D/w Id Pharm D  11/28 21:36 and 21:41 NGTD  Of note, all blood cx on 11/28 were drawn before abtx   Acute metabolic encephalopathy - improving   RT lateral foot Osteomyelitis/Uncontrolled DM ( A1c 12.9) Has healed with a callus with no active infection currently  Seems to  have  got 7+ weeks of doxycycline and cefadroxil since 11/7  No need for abtx for Rt foot   Recommendations  Continue Unasyn as is  2 sets of peripheral blood cx ordered given PPM although there is possibility of poor blood draw/contamination from 11/28 Fu TTE and MRI T spine Monitor CBC and BMP Would get Poduatry eval for left lateral foot  D/w primary   Rest of the management as per the primary team. Please call with questions or concerns.  Thank you for the consult  Rosiland Oz, MD Infectious Disease Physician [facility] [address] [place], [place] [address] Phone: [phone]  Fax: [phone]  __________________________________________________________________________________________________________ HPI and Hospital Course: 86 year old female with PMH as below including Cognitive communication deficit, 2 DM, complete heart block status post PPM, chronic PEM, chronic respiratory failure on home 3 L oxygen , chronic wounds including left metatarsal head osteomyelitis followed by Dr. Drucilla Schmidt on doxycycline and cefadroxil who was brought to the ED on 11/28 with weakness, nausea, lower abdominal pain, loose stools and poor p.o. intake for a week.  At EMS arrival patient had urinated on the floor which was discolored, patient was reportedly being treated for  UTI with ciprofloxacin. She tells me she was taking abtx but does nopt know the names. She also fell several days ago striking the back of her head.  Seen in the ED 11/21 with negative CT head and spine. Denies any fevers/chills or or sweats   At ED, afebrile, LA 2.0 WBC 18.8, hemoglobin 15.1 sodium 133 potassium 5.1 creatinine 0.72 BUN 31 glucose 438 Found to have urinary retention status post Foley's with greater than 1700 cc of turbid urine obtained  Imagings as below CT abdomen pelvis 11/28 Severe urinary bladder distention is noted which results in moderate bilateral  hydroureteronephrosis.  Acute fractures seen in the superior endplate of W97 vertebral body.   ROS: poor historian, all systems reviewed with pertinent positives and negatives as listed above   Past Medical History:  Diagnosis Date   Acute ischemic stroke ([place]) 08/10/2021   Anxiety disorder, unspecified    Arthritis    Benign paroxysmal positional vertigo 08/09/2012   Chronic rhinitis    Cognitive communication deficit    Constipation, unspecified    COPD mixed type ([place])    on home O2   Diverticulosis    DKA (diabetic ketoacidosis) ([place]) 10/02/2021   Esophageal stricture    Essential (primary) hypertension    Gastro-esophageal reflux disease without esophagitis    Glaucoma    Hiatal hernia    Hyperlipidemia, unspecified    Irritable bowel syndrome    Ischemic stroke ([place]) 08/09/2021   Osteomyelitis of fifth toe of left foot ([place]) 03/14/2022   Presence of permanent cardiac pacemaker 01/14/2020   Spinal stenosis, lumbar region, without neurogenic claudication    Steatohepatitis    Thrombocytopenia, unspecified ([place])    Type 2 diabetes mellitus with diabetic polyneuropathy ([place])    Past Surgical History:  Procedure Laterality Date   APPENDECTOMY     CARPAL TUNNEL RELEASE     right hand   CHOLECYSTECTOMY OPEN  1978   COLONOSCOPY  07-2001   mild diverticulosis   ESOPHAGOGASTRODUODENOSCOPY  9892,11-94   H Hernia,es.stricture s/p dil 82F   INTRAOCULAR LENS INSERTION Bilateral    LIVER BIOPSY  [phone]   PACEMAKER IMPLANT N/A 01/14/2020   Procedure: PACEMAKER IMPLANT;  Surgeon: Evans Lance, MD;  Location: [facility];  Service: Cardiovascular;  Laterality: N/A;   PARATHYROID EXPLORATION     TONSILLECTOMY AND ADENOIDECTOMY     TOTAL ABDOMINAL HYSTERECTOMY     ULNAR NERVE TRANSPOSITION  12/07/2011   Procedure: ULNAR NERVE DECOMPRESSION/TRANSPOSITION;this was cancelled-not done  Surgeon: Cammie Sickle., MD;  Location: [facility];  Service: Orthopedics;   Laterality: Right;  right ulnar nerve in situ decompression   ULNAR TUNNEL RELEASE  03/07/2012   Procedure: CUBITAL TUNNEL RELEASE;  Surgeon: Roseanne Kaufman, MD;  Location: [facility];  Service: Orthopedics;  Laterality: Right;  ulnar nerve release at the elbow       Scheduled Meds:  budesonide (PULMICORT) nebulizer solution  0.25 mg Nebulization BID   Chlorhexidine Gluconate Cloth  6 each Topical Daily   feeding supplement (GLUCERNA SHAKE)  237 mL Oral BID BM   heparin  5,000 Units Subcutaneous Q8H   insulin aspart  0-9 Units Subcutaneous Q4H   insulin glargine-yfgn  10 Units Subcutaneous QHS   ipratropium-albuterol  3 mL Nebulization BID   leptospermum manuka honey  1 Application Topical Daily   Ensure Max Protein  11 oz Oral Daily   Continuous Infusions:  ampicillin-sulbactam (UNASYN) IV 3 g (04/06/22 0559)  PRN Meds:.acetaminophen, ondansetron (ZOFRAN) IV  Allergies  Allergen Reactions   Chlordiazepoxide-Clidinium Other (See Comments)    Sleepy,weak   Biaxin [Clarithromycin] Other (See Comments)    Foul taste, abd pain, diarrhea   Tape Other (See Comments)    SKIN IS VERY THIN AND WILL TEAR AND BRUISE EASILY!!   Ultram [Tramadol] Other (See Comments)    Caused tremors   Codeine Other (See Comments)    Upset the stomach   Social History   Socioeconomic History   Marital status: Single    Spouse name: John,divorced, deceased   Number of children: 0   Years of education: high school   Highest education level: High school graduate  Occupational History   Occupation: Retired     Comment: Training and development officer  Tobacco Use   Smoking status: Former    Types: Cigarettes    Quit date: 05/08/1988    Years since quitting: 33.9   Smokeless tobacco: Never  Vaping Use   Vaping Use: Never used  Substance and Sexual Activity   Alcohol use: No    Alcohol/week: 0.0 standard drinks of alcohol   Drug use: No   Sexual activity: Not Currently  Other Topics Concern   Not on  file  Social History Narrative   Not on file   Social Determinants of Health   Financial Resource Strain: Low Risk  (10/24/2021)   Overall Financial Resource Strain (CARDIA)    Difficulty of Paying Living Expenses: Not hard at all  Food Insecurity: No Food Insecurity (01/16/2022)   Hunger Vital Sign    Worried About Running Out of Food in the Last Year: Never true    [place] in the Last Year: Never true  Transportation Needs: No Transportation Needs (01/16/2022)   PRAPARE - Hydrologist (Medical): No    Lack of Transportation (Non-Medical): No  Recent Concern: Transportation Needs - Unmet Transportation Needs (10/24/2021)   PRAPARE - Transportation    Lack of Transportation (Medical): Yes    Lack of Transportation (Non-Medical): Yes  Physical Activity: Not on file  Stress: No Stress Concern Present (04/28/2020)   [place]    Feeling of Stress : Only a little  Social Connections: Moderately Isolated (02/17/2020)   Social Connection and Isolation Panel [NHANES]    Frequency of Communication with Friends and Family: More than three times a week    Frequency of Social Gatherings with Friends and Family: More than three times a week    Attends Religious Services: More than 4 times per year    Active Member of [employer] or Organizations: No    Attends Archivist Meetings: Patient refused    Marital Status: Divorced  Human resources officer Violence: Not At Risk (10/24/2021)   Humiliation, Afraid, Rape, and Kick questionnaire    Fear of Current or Ex-Partner: No    Emotionally Abused: No    Physically Abused: No    Sexually Abused: No   Family History  Problem Relation Age of Onset   Heart disease Father    Lung cancer Mother        small cell;Byssinosis   Lung cancer Sister    Liver cancer Sister        ? mets from another area of the body   Diabetes Other        grandmother    Stroke Maternal Grandfather      Vitals BP 113/60 (BP Location:  Left Arm)   Pulse 88   Temp 98 F (36.7 C) (Oral)   Resp 18   Ht 5' 3"  (1.6 m)   Wt 47.5 kg   SpO2 96%   BMI 18.55 kg/m    Physical Exam Constitutional:  chronically ill looking Caucasian female having break fast , not in acute distress     Comments: cachectic  Cardiovascular:     Rate and Rhythm: Normal rate and regular rhythm.     Heart sounds: PPM site with no overlying erythema, tenderness and fluctuance   Pulmonary:     Effort: Pulmonary effort is normal on room air     Comments: Normal breath sounds  Abdominal:     Palpations: Abdomen is soft.     Tenderness: non tender and non distended. Foley's draining yellow urine in urobag  Musculoskeletal:        General: No swelling or tenderness in peripheral joints   Left foot has a chronic appearing callus in the lateral 5th metatarsal with no signs of acute infection. No wound or ulcer in the rt foot     Skin:    Comments: No lesions or rashes, small superficial wound in the left hip, stage 1 sacral ulcer, does not appear to be infected   Neurological:     General: awake, alert and oriented, follows commands  Psychiatric:        Mood and Affect: Mood normal.    Pertinent Microbiology Results for orders placed or performed during the hospital encounter of 04/04/22  Urine Culture     Status: None   Collection Time: 04/04/22  1:36 PM   Specimen: Urine, Clean Catch  Result Value Ref Range Status   Specimen Description   Final    URINE, CLEAN CATCH Performed at [facility], [place] [address], [place], [place] [address]    Special Requests   Final    NONE Performed at [facility], [place] [address], [place], [place] [address]    Culture   Final    NO GROWTH Performed at [facility], [place] [address] [place], [place] [address]    Report Status 04/05/2022 FINAL  Final  Culture, blood (single)     Status:  Abnormal (Preliminary result)   Collection Time: 04/04/22  6:16 PM   Specimen: BLOOD  Result Value Ref Range Status   Specimen Description   Final    BLOOD SITE NOT SPECIFIED Performed at [place] [address], [place], [place] [address]    Special Requests   Final    BOTTLES DRAWN AEROBIC AND ANAEROBIC Blood Culture results may not be optimal due to an inadequate volume of blood received in culture bottles Performed at [facility] [address], [place], [place] [address]    Culture  Setup Time   Final    GRAM POSITIVE COCCI IN CHAINS ANAEROBIC BOTTLE ONLY CRITICAL RESULT CALLED TO, READ BACK BY AND VERIFIED WITH: PHARMD C. SHADE [id number] @1607  FH GRAM POSITIVE COCCI IN CLUSTERS BOTTLES DRAWN AEROBIC ONLY CRITICAL RESULT CALLED TO, READ BACK BY AND VERIFIED WITH: PHARMD GADHIA 04/06/22 @ 0707 BY AB    Culture (A)  Final    ENTEROCOCCUS FAECALIS SUSCEPTIBILITIES TO FOLLOW Performed at [facility], [place] [address], [place], [place] [address]    Report Status PENDING  Incomplete  Blood Culture ID Panel (Reflexed)     Status: Abnormal   Collection Time: 04/04/22  6:16  PM  Result Value Ref Range Status   Enterococcus faecalis DETECTED (A) NOT DETECTED Final    Comment: CRITICAL RESULT CALLED TO, READ BACK BY AND VERIFIED WITH: PHARMD C. SHADE [id number] @1607  FH    Enterococcus Faecium NOT DETECTED NOT DETECTED Final   Listeria monocytogenes NOT DETECTED NOT DETECTED Final   Staphylococcus species NOT DETECTED NOT DETECTED Final   Staphylococcus aureus (BCID) NOT DETECTED NOT DETECTED Final   Staphylococcus epidermidis NOT DETECTED NOT DETECTED Final   Staphylococcus lugdunensis NOT DETECTED NOT DETECTED Final   Streptococcus species DETECTED (A) NOT DETECTED Final    Comment: Not Enterococcus species, Streptococcus agalactiae, Streptococcus pyogenes, or Streptococcus pneumoniae. CRITICAL RESULT CALLED TO, READ BACK BY AND VERIFIED  WITH: PHARMD C. SHADE [phone] @1607  FH    Streptococcus agalactiae NOT DETECTED NOT DETECTED Final   Streptococcus pneumoniae NOT DETECTED NOT DETECTED Final   Streptococcus pyogenes NOT DETECTED NOT DETECTED Final   A.calcoaceticus-baumannii NOT DETECTED NOT DETECTED Final   Bacteroides fragilis NOT DETECTED NOT DETECTED Final   Enterobacterales NOT DETECTED NOT DETECTED Final   Enterobacter cloacae complex NOT DETECTED NOT DETECTED Final   Escherichia coli NOT DETECTED NOT DETECTED Final   Klebsiella aerogenes NOT DETECTED NOT DETECTED Final   Klebsiella oxytoca NOT DETECTED NOT DETECTED Final   Klebsiella pneumoniae NOT DETECTED NOT DETECTED Final   Proteus species NOT DETECTED NOT DETECTED Final   Salmonella species NOT DETECTED NOT DETECTED Final   Serratia marcescens NOT DETECTED NOT DETECTED Final   Haemophilus influenzae NOT DETECTED NOT DETECTED Final   Neisseria meningitidis NOT DETECTED NOT DETECTED Final   Pseudomonas aeruginosa NOT DETECTED NOT DETECTED Final   Stenotrophomonas maltophilia NOT DETECTED NOT DETECTED Final   Candida albicans NOT DETECTED NOT DETECTED Final   Candida auris NOT DETECTED NOT DETECTED Final   Candida glabrata NOT DETECTED NOT DETECTED Final   Candida krusei NOT DETECTED NOT DETECTED Final   Candida parapsilosis NOT DETECTED NOT DETECTED Final   Candida tropicalis NOT DETECTED NOT DETECTED Final   Cryptococcus neoformans/gattii NOT DETECTED NOT DETECTED Final   Vancomycin resistance NOT DETECTED NOT DETECTED Final    Comment: Performed at [facility], [address] [place], [place] [address]  Blood culture (routine x 2)     Status: None (Preliminary result)   Collection Time: 04/04/22  9:36 PM   Specimen: BLOOD  Result Value Ref Range Status   Specimen Description   Final    BLOOD RIGHT ANTECUBITAL Performed at [facility] [address], [place], [place] [address]    Special Requests   Final    BOTTLES DRAWN  AEROBIC ONLY Blood Culture results may not be optimal due to an inadequate volume of blood received in culture bottles Performed at [place] [facility] [address] [place], [place] [address]    Culture   Final    NO GROWTH 1 DAY Performed at [facility], [place] [address], [place],  [address]    Report Status PENDING  Incomplete  Blood culture (routine x 2)     Status: None (Preliminary result)   Collection Time: 04/04/22  9:41 PM   Specimen: BLOOD  Result Value Ref Range Status   Specimen Description   Final    BLOOD LEFT ANTECUBITAL Performed at [facility] [address] [place],  [address]    Special Requests   Final    BOTTLES DRAWN AEROBIC ONLY Blood Culture results may  not be optimal due to an inadequate volume of blood received in culture bottles Performed at [facility] [address], [place], [place] [address]    Culture   Final    NO GROWTH 1 DAY Performed at [facility], [place] [address] [place], [place] [address]    Report Status PENDING  Incomplete  Blood Culture ID Panel (Reflexed)     Status: None   Collection Time: 04/06/22  5:43 AM  Result Value Ref Range Status   Enterococcus faecalis NOT DETECTED NOT DETECTED Final   Enterococcus Faecium NOT DETECTED NOT DETECTED Final   Listeria monocytogenes NOT DETECTED NOT DETECTED Final   Staphylococcus species NOT DETECTED NOT DETECTED Final   Staphylococcus aureus (BCID) NOT DETECTED NOT DETECTED Final   Staphylococcus epidermidis NOT DETECTED NOT DETECTED Final   Staphylococcus lugdunensis NOT DETECTED NOT DETECTED Final   Streptococcus species NOT DETECTED NOT DETECTED Final   Streptococcus agalactiae NOT DETECTED NOT DETECTED Final   Streptococcus pneumoniae NOT DETECTED NOT DETECTED Final   Streptococcus pyogenes NOT DETECTED NOT DETECTED Final   A.calcoaceticus-baumannii NOT DETECTED NOT DETECTED Final   Bacteroides fragilis NOT  DETECTED NOT DETECTED Final   Enterobacterales NOT DETECTED NOT DETECTED Final   Enterobacter cloacae complex NOT DETECTED NOT DETECTED Final   Escherichia coli NOT DETECTED NOT DETECTED Final   Klebsiella aerogenes NOT DETECTED NOT DETECTED Final   Klebsiella oxytoca NOT DETECTED NOT DETECTED Final   Klebsiella pneumoniae NOT DETECTED NOT DETECTED Final   Proteus species NOT DETECTED NOT DETECTED Final   Salmonella species NOT DETECTED NOT DETECTED Final   Serratia marcescens NOT DETECTED NOT DETECTED Final   Haemophilus influenzae NOT DETECTED NOT DETECTED Final   Neisseria meningitidis NOT DETECTED NOT DETECTED Final   Pseudomonas aeruginosa NOT DETECTED NOT DETECTED Final   Stenotrophomonas maltophilia NOT DETECTED NOT DETECTED Final   Candida albicans NOT DETECTED NOT DETECTED Final   Candida auris NOT DETECTED NOT DETECTED Final   Candida glabrata NOT DETECTED NOT DETECTED Final   Candida krusei NOT DETECTED NOT DETECTED Final   Candida parapsilosis NOT DETECTED NOT DETECTED Final   Candida tropicalis NOT DETECTED NOT DETECTED Final   Cryptococcus neoformans/gattii NOT DETECTED NOT DETECTED Final    Comment: Performed at [facility], [place] [address], [place], [place] [address]   *Note: Due to a large number of results and/or encounters for the requested time period, some results have not been displayed. A complete set of results can be found in Results Review.   Pertinent Lab seen by me:    Latest Ref Rng & Units 04/06/2022    4:02 AM 04/05/2022   12:40 AM 04/04/2022   11:40 AM  CBC  WBC 4.0 - 10.5 K/uL 6.8  11.8  18.8   Hemoglobin 12.0 - 15.0 g/dL 12.9  14.2  15.1   Hematocrit 36.0 - 46.0 % 41.2  46.4  47.0   Platelets 150 - 400 K/uL 197  246  369       Latest Ref Rng & Units 04/06/2022    4:02 AM 04/05/2022   12:40 AM 04/04/2022    6:16 PM  CMP  Glucose 70 - 99 mg/dL 211  391    BUN 8 - 23 mg/dL 16  31    Creatinine 0.44 - 1.00 mg/dL 0.62  0.85    Sodium  135 - 145 mmol/L 135  135    Potassium 3.5 - 5.1 mmol/L 3.9  4.7    Chloride 98 - 111 mmol/L 98  98    CO2 22 - 32 mmol/L 28  25    Calcium 8.9 - 10.3 mg/dL 8.5  9.0    Total Protein 6.5 - 8.1 g/dL  6.9  6.3   Total Bilirubin 0.3 - 1.2 mg/dL  0.8  0.8   Alkaline Phos 38 - 126 U/L  63  59   AST 15 - 41 U/L  9  11   ALT 0 - 44 U/L  9  7     Pertinent Imagings/Other Imagings Plain films and CT images have been personally visualized and interpreted; radiology reports have been reviewed. Decision making incorporated into the Impression / Recommendations.  CT ABDOMEN PELVIS WO CONTRAST  Result Date: 04/04/2022 CLINICAL DATA:  Left lower quadrant abdominal pain. EXAM: CT ABDOMEN AND PELVIS WITHOUT CONTRAST TECHNIQUE: Multidetector CT imaging of the abdomen and pelvis was performed following the standard protocol without IV contrast. RADIATION DOSE REDUCTION: This exam was performed according to the departmental dose-optimization program which includes automated exposure control, adjustment of the mA and/or kV according to patient size and/or use of iterative reconstruction technique. COMPARISON:  November 10, 2021. FINDINGS: Lower chest: No acute abnormality. Hepatobiliary: No focal liver abnormality is seen. Status post cholecystectomy. No biliary dilatation. Pancreas: Unremarkable. No pancreatic ductal dilatation or surrounding inflammatory changes. Spleen: Normal in size without focal abnormality. Adrenals/Urinary Tract: Adrenal glands appear normal. Moderate bilateral hydroureteronephrosis is noted without obstructing calculus. This appears to be due to severe urinary bladder distention. Stomach/Bowel: Stomach is unremarkable. There is no evidence of bowel obstruction or inflammation. Vascular/Lymphatic: Aortic atherosclerosis. No enlarged abdominal or pelvic lymph nodes. Reproductive: Status post hysterectomy. No adnexal masses. Other: No abdominal wall hernia or abnormality. No abdominopelvic ascites.  Musculoskeletal: Acute fracture is seen involving superior endplate of Z61 vertebral body. IMPRESSION: Severe urinary bladder distention is noted which results in moderate bilateral hydroureteronephrosis. Acute fracture seen involving superior endplate of W96 vertebral body. Aortic Atherosclerosis (ICD10-I70.0). Electronically Signed   By: Marijo Conception M.D.   On: 04/04/2022 17:49   DG Chest 2 View  Result Date: 04/04/2022 CLINICAL DATA:  Weakness and anorexia. EXAM: CHEST - 2 VIEW COMPARISON:  12/08/2021 FINDINGS: The pacer wires are stable. The heart is normal in size. Stable tortuosity and calcification of the thoracic aorta. No acute pulmonary findings. Stable eventration of the right hemidiaphragm peer the bony thorax is intact. IMPRESSION: No acute cardiopulmonary findings. Electronically Signed   By: Marijo Sanes M.D.   On: 04/04/2022 15:06   CT Head Wo Contrast  Result Date: 03/28/2022 CLINICAL DATA:  Provided history: Polytrauma, blunt. Recent fall. EXAM: CT HEAD WITHOUT CONTRAST CT CERVICAL SPINE WITHOUT CONTRAST TECHNIQUE: Multidetector CT imaging of the head and cervical spine was performed following the standard protocol without intravenous contrast. Multiplanar CT image reconstructions of the cervical spine were also generated. RADIATION DOSE REDUCTION: This exam was performed according to the departmental dose-optimization program which includes automated exposure control, adjustment of the mA and/or kV according to patient size and/or use of iterative reconstruction technique. COMPARISON:  Head CT 12/08/2021. CT cervical spine 11/09/2021. FINDINGS: CT HEAD FINDINGS Brain: Moderate cerebral atrophy. Mild-to-moderate cerebellar atrophy. Moderate patchy and ill-defined hypoattenuation within the cerebral white matter, nonspecific but compatible with chronic small vessel disease. There is no acute intracranial hemorrhage. No demarcated cortical infarct. No extra-axial fluid collection. No  evidence of an intracranial mass. No midline shift. Vascular: No hyperdense vessel.  Atherosclerotic  calcifications. Skull: No fracture or aggressive osseous lesion. Sinuses/Orbits: No mass or acute finding within the imaged orbits. No significant paranasal sinus disease at the imaged levels. CT CERVICAL SPINE FINDINGS Mildly motion degraded exam. Alignment: Reversal of the expected cervical lordosis. Slight grade 1 anterolisthesis at C2-C3. Slight grade 1 retrolisthesis at C4-C5. 2 mm grade 1 retrolisthesis at C5-C6. Skull base and vertebrae: The basion-dental and atlanto-dental intervals are maintained.No evidence of acute fracture to the cervical spine. Soft tissues and spinal canal: No prevertebral fluid or swelling. No visible canal hematoma. Disc levels: Cervical spondylosis with multilevel disc space narrowing, disc bulges, uncovertebral hypertrophy and facet arthrosis. No appreciable high-grade spinal canal stenosis. Multilevel bony neural foraminal narrowing. Upper chest: No consolidation within the imaged lung apices. Centrilobular and paraseptal emphysema. IMPRESSION: CT head: 1. No evidence of acute intracranial abnormality. 2. Parenchymal atrophy and chronic small vessel disease, as described. CT cervical spine: 1. No evidence of acute fracture to the cervical spine. 2. Nonspecific reversal of the expected cervical doses. 3. Mild grade 1 spondylolisthesis at C2-C3, C4-C5 and C5-C6. 4. Cervical spondylosis, as described. Electronically Signed   By: Kellie Simmering D.O.   On: 03/28/2022 14:35   CT Cervical Spine Wo Contrast  Result Date: 03/28/2022 CLINICAL DATA:  Provided history: Polytrauma, blunt. Recent fall. EXAM: CT HEAD WITHOUT CONTRAST CT CERVICAL SPINE WITHOUT CONTRAST TECHNIQUE: Multidetector CT imaging of the head and cervical spine was performed following the standard protocol without intravenous contrast. Multiplanar CT image reconstructions of the cervical spine were also generated.  RADIATION DOSE REDUCTION: This exam was performed according to the departmental dose-optimization program which includes automated exposure control, adjustment of the mA and/or kV according to patient size and/or use of iterative reconstruction technique. COMPARISON:  Head CT 12/08/2021. CT cervical spine 11/09/2021. FINDINGS: CT HEAD FINDINGS Brain: Moderate cerebral atrophy. Mild-to-moderate cerebellar atrophy. Moderate patchy and ill-defined hypoattenuation within the cerebral white matter, nonspecific but compatible with chronic small vessel disease. There is no acute intracranial hemorrhage. No demarcated cortical infarct. No extra-axial fluid collection. No evidence of an intracranial mass. No midline shift. Vascular: No hyperdense vessel.  Atherosclerotic calcifications. Skull: No fracture or aggressive osseous lesion. Sinuses/Orbits: No mass or acute finding within the imaged orbits. No significant paranasal sinus disease at the imaged levels. CT CERVICAL SPINE FINDINGS Mildly motion degraded exam. Alignment: Reversal of the expected cervical lordosis. Slight grade 1 anterolisthesis at C2-C3. Slight grade 1 retrolisthesis at C4-C5. 2 mm grade 1 retrolisthesis at C5-C6. Skull base and vertebrae: The basion-dental and atlanto-dental intervals are maintained.No evidence of acute fracture to the cervical spine. Soft tissues and spinal canal: No prevertebral fluid or swelling. No visible canal hematoma. Disc levels: Cervical spondylosis with multilevel disc space narrowing, disc bulges, uncovertebral hypertrophy and facet arthrosis. No appreciable high-grade spinal canal stenosis. Multilevel bony neural foraminal narrowing. Upper chest: No consolidation within the imaged lung apices. Centrilobular and paraseptal emphysema. IMPRESSION: CT head: 1. No evidence of acute intracranial abnormality. 2. Parenchymal atrophy and chronic small vessel disease, as described. CT cervical spine: 1. No evidence of acute fracture  to the cervical spine. 2. Nonspecific reversal of the expected cervical doses. 3. Mild grade 1 spondylolisthesis at C2-C3, C4-C5 and C5-C6. 4. Cervical spondylosis, as described. Electronically Signed   By: Kellie Simmering D.O.   On: 03/28/2022 14:35    I spent  95 minutes for this patient encounter including review of prior medical records/discussing diagnostics and treatment plan with the patient/family/coordinate care with primary/other specialits  with greater than 50% of time in face to face encounter.   Electronically signed by:   Rosiland Oz, MD Infectious Disease Physician [facility] Pager: [phone]

## 2022-04-06 NOTE — Progress Notes (Signed)
PHARMACY - PHYSICIAN COMMUNICATION CRITICAL VALUE ALERT - BLOOD CULTURE IDENTIFICATION (BCID)  [name] is an 86 y.o. female who presented to [facility] on 04/04/2022 with a chief complaint of AMS  Assessment: 61 YOF with AMS and concern for urosepsis with GPC in chains in 1 of 4 bottles - BCID detecting enterococcus and strep species and GPC in clusters also now growing in 1 bottle with BCID not detecting anything on the panel. Of noting, the patient is also being seen and treated for a L-foot 5th metatarsal osteo.   These cultures are noted to be from a single set from an unspecified site. A repeated set of blood cultures also prior to antibiotics on 11/28 are currently showing ngtd.   Name of physician (or Provider) Contacted: Manandhar (ID consulted)  Current antibiotics: Unasyn  Changes to prescribed antibiotics recommended:  No additional coverage for GPC in clusters for now - continue Unasyn monotherapy.  Results for orders placed or performed during the hospital encounter of 04/04/22  Blood Culture ID Panel (Reflexed) (Collected: 04/06/2022  5:43 AM)  Result Value Ref Range   Enterococcus faecalis NOT DETECTED NOT DETECTED   Enterococcus Faecium NOT DETECTED NOT DETECTED   Listeria monocytogenes NOT DETECTED NOT DETECTED   Staphylococcus species NOT DETECTED NOT DETECTED   Staphylococcus aureus (BCID) NOT DETECTED NOT DETECTED   Staphylococcus epidermidis NOT DETECTED NOT DETECTED   Staphylococcus lugdunensis NOT DETECTED NOT DETECTED   Streptococcus species NOT DETECTED NOT DETECTED   Streptococcus agalactiae NOT DETECTED NOT DETECTED   Streptococcus pneumoniae NOT DETECTED NOT DETECTED   Streptococcus pyogenes NOT DETECTED NOT DETECTED   A.calcoaceticus-baumannii NOT DETECTED NOT DETECTED   Bacteroides fragilis NOT DETECTED NOT DETECTED   Enterobacterales NOT DETECTED NOT DETECTED   Enterobacter cloacae complex NOT DETECTED NOT DETECTED   Escherichia coli NOT  DETECTED NOT DETECTED   Klebsiella aerogenes NOT DETECTED NOT DETECTED   Klebsiella oxytoca NOT DETECTED NOT DETECTED   Klebsiella pneumoniae NOT DETECTED NOT DETECTED   Proteus species NOT DETECTED NOT DETECTED   Salmonella species NOT DETECTED NOT DETECTED   Serratia marcescens NOT DETECTED NOT DETECTED   Haemophilus influenzae NOT DETECTED NOT DETECTED   Neisseria meningitidis NOT DETECTED NOT DETECTED   Pseudomonas aeruginosa NOT DETECTED NOT DETECTED   Stenotrophomonas maltophilia NOT DETECTED NOT DETECTED   Candida albicans NOT DETECTED NOT DETECTED   Candida auris NOT DETECTED NOT DETECTED   Candida glabrata NOT DETECTED NOT DETECTED   Candida krusei NOT DETECTED NOT DETECTED   Candida parapsilosis NOT DETECTED NOT DETECTED   Candida tropicalis NOT DETECTED NOT DETECTED   Cryptococcus neoformans/gattii NOT DETECTED NOT DETECTED    Thank you for allowing pharmacy to be a part of this patient's care.  Alycia Rossetti, PharmD, BCPS Infectious Diseases Clinical Pharmacist 04/06/2022 8:22 AM   **Pharmacist phone directory can now be found on [email or web address] ([email or web address]).  Listed under [place].

## 2022-04-07 ENCOUNTER — Ambulatory Visit (HOSPITAL_COMMUNITY)
Admission: RE | Admit: 2022-04-07 | Discharge: 2022-04-07 | Disposition: A | Discharge status: Home / Self Care | Payer: Medicare Other | Source: Ambulatory Visit | Attending: Cardiovascular Disease | Admitting: Cardiovascular Disease

## 2022-04-07 DIAGNOSIS — R338 Other retention of urine: Secondary | ICD-10-CM | POA: Diagnosis not present

## 2022-04-07 DIAGNOSIS — A419 Sepsis, unspecified organism: Secondary | ICD-10-CM | POA: Diagnosis not present

## 2022-04-07 DIAGNOSIS — N3000 Acute cystitis without hematuria: Secondary | ICD-10-CM | POA: Diagnosis not present

## 2022-04-07 DIAGNOSIS — G9341 Metabolic encephalopathy: Secondary | ICD-10-CM | POA: Diagnosis not present

## 2022-04-07 DIAGNOSIS — E43 Unspecified severe protein-calorie malnutrition: Secondary | ICD-10-CM

## 2022-04-07 LAB — CBC
HCT: 41.9 % (ref 36.0–46.0)
Hemoglobin: 13 g/dL (ref 12.0–15.0)
MCH: 26.8 pg (ref 26.0–34.0)
MCHC: 31 g/dL (ref 30.0–36.0)
MCV: 86.4 fL (ref 80.0–100.0)
Platelets: 194 10*3/uL (ref 150–400)
RBC: 4.85 MIL/uL (ref 3.87–5.11)
RDW: 14.3 % (ref 11.5–15.5)
WBC: 7.7 10*3/uL (ref 4.0–10.5)
nRBC: 0 % (ref 0.0–0.2)

## 2022-04-07 LAB — CULTURE, BLOOD (SINGLE)

## 2022-04-07 LAB — BASIC METABOLIC PANEL
Anion gap: 6 (ref 5–15)
BUN: 16 mg/dL (ref 8–23)
CO2: 32 mmol/L (ref 22–32)
Calcium: 8.5 mg/dL — ABNORMAL LOW (ref 8.9–10.3)
Chloride: 96 mmol/L — ABNORMAL LOW (ref 98–111)
Creatinine, Ser: 0.69 mg/dL (ref 0.44–1.00)
GFR, Estimated: >60 mL/min (ref >60)
Glucose, Bld: 166 mg/dL — ABNORMAL HIGH (ref 70–99)
Potassium: 3.9 mmol/L (ref 3.5–5.1)
Sodium: 134 mmol/L — ABNORMAL LOW (ref 135–145)

## 2022-04-07 LAB — GLUCOSE, CAPILLARY
Glucose-Capillary: 209 mg/dL — ABNORMAL HIGH (ref 70–99)
Glucose-Capillary: 211 mg/dL — ABNORMAL HIGH (ref 70–99)
Glucose-Capillary: 234 mg/dL — ABNORMAL HIGH (ref 70–99)
Glucose-Capillary: 304 mg/dL — ABNORMAL HIGH (ref 70–99)
Glucose-Capillary: 316 mg/dL — ABNORMAL HIGH (ref 70–99)
Glucose-Capillary: 358 mg/dL — ABNORMAL HIGH (ref 70–99)

## 2022-04-07 MED ORDER — TAMSULOSIN HCL 0.4 MG PO CAPS
0.4000 mg | ORAL_CAPSULE | Freq: Every day | ORAL | Status: DC
  Administered 2022-04-07 – 2022-04-20 (×13): 0.4 mg via ORAL
  Filled 2022-04-07 (×13): qty 1

## 2022-04-07 NOTE — Care Management Important Message (Signed)
Important Message  Patient Details IM Letter given Name: [name] MRN: [id number] Date of Birth: Apr 06, 1936   Medicare Important Message Given:  Yes     Kerin Salen 04/07/2022, 2:12 PM

## 2022-04-07 NOTE — Progress Notes (Signed)
Patient admitted with polymicrobial bacteremia most likely secondary to urosepsis but patient also with chronic right lateral foot osteomyelitis and history of diabetes.  Patient initially encephalopathic but this is improved.  She has been discovered to have a T12 osteoporotic compression fracture with some associated pain.  There is no retropulsion or canal stenosis.  There is nothing to suggest osteomyelitis or discitis.  She may be treated with bracing for pain control.  Given her osteomyelitis, bacteremia and history of a sacral decubitus she is certainly not a candidate for any type of intervention.

## 2022-04-07 NOTE — Progress Notes (Addendum)
PROGRESS NOTE    [name]  SPQ:[id number] DOB: 03-Nov-1935 DOA: 04/04/2022 PCP: Wardell Honour, MD   Brief Narrative:  86 year old female with history of diabetes mellitus type 2, complete heart block status post pacemaker, chronic hypoxic respiratory failure on home oxygen at 3 L/min, COPD, unspecified CVA, hypertension, hyperlipidemia, thrombocytopenia, chronic wounds including left metatarsal head osteomyelitis followed by Dr. Drucilla Schmidt on doxycycline and cefadroxil presented with altered mental status.  On presentation, she was hypoglycemic and found to have UTI.  She was started on IV fluids and broad-spectrum antibiotics.  She was found to have polymicrobial bacteremia: ID was consulted; antibiotics switched to Unasyn.  She was also found to have T12 vertebral fracture.  Assessment & Plan:   Polymicrobial bacteremia Sepsis: Present on -Blood cultures on admission positive for Enterococcus faecalis and Rothia. questionable cause.  ID following.  Repeat blood cultures from 04/06/2022 are negative so far.  Currently on Unasyn as per ID. -2D echo was poor study which showed EF of 45 to 50% but no obvious vegetation.  The patient has persistent bacteremia, might need TEE  UTI/acute cystitis without hematuria -Urine cultures negative so far.  Antibiotics as above  Acute metabolic encephalopathy -Possibly from above.  Mental status improving.  Still slow to respond and slightly confused to time.  Fall precautions.  Monitor mental status.  PT eval.  Acute urinary retention -Currently has a Foley catheter.  Start Flomax.  Will give voiding trial in the next few days  T12 vertebral fracture -Currently the MRI because of pacemaker.  CT shows acute T12 vertebral compression fracture.  Neurosurgery consulted as per ID recommendations. -PT eval.  Chronic right lateral foot osteomyelitis Left lateral foot callus -Patient has received more than 7 weeks of oral doxycycline and  cefadroxil. -I have consulted podiatry as per ID recommendations to see if the left lateral foot callus needs any further investigation or treatment.  Leukocytosis -Resolved  Hyponatremia -Mild.  Monitor  Severe protein calorie malnutrition -Follow nutrition recommendations  Various wounds: I have personally evaluated and agree with the documentation of the wounds as below Pressure Injury 01/11/22 Sacrum Mid;Medial Stage 1 -  Intact skin with non-blanchable redness of a localized area usually over a bony prominence. (Active)  01/11/22 0226  Location: Sacrum  Location Orientation: Mid;Medial  Staging: Stage 1 -  Intact skin with non-blanchable redness of a localized area usually over a bony prominence.  Wound Description (Comments):   Present on Admission: Yes     Pressure Injury 04/05/22 Hip Anterior;Left;Proximal Stage 2 -  Partial thickness loss of dermis presenting as a shallow open injury with a red, pink wound bed without slough. Round 1cmx1cm pressure sore, yellow center with pink edges, dry wound bed (Active)  04/05/22 0000  Location: Hip  Location Orientation: Anterior;Left;Proximal  Staging: Stage 2 -  Partial thickness loss of dermis presenting as a shallow open injury with a red, pink wound bed without slough.  Wound Description (Comments): Round 1cmx1cm pressure sore, yellow center with pink edges, dry wound bed  Present on Admission: Yes     Pressure Injury 04/05/22 Toe (Comment  which one) Left;Lateral Unstageable - Full thickness tissue loss in which the base of the injury is covered by slough (yellow, tan, gray, green or brown) and/or eschar (tan, brown or black) in the wound bed. 2cmx1cm (Active)  04/05/22 0000  Location: Toe (Comment  which one) (outer aspect of pinky toe)  Location Orientation: Left;Lateral  Staging: Unstageable - Full thickness tissue  loss in which the base of the injury is covered by slough (yellow, tan, gray, green or brown) and/or eschar (tan,  brown or black) in the wound bed.  Wound Description (Comments): 2cmx1cm oval shaped wound, covered with grey/brown scabbing which has hair/fuzz? imbedded in it, dry with no drainage  Present on Admission: Yes   -Wound care as per wound care consult recommendations  History of complete heart block status post pacemaker -Outpatient follow-up with cardiology/EP  COPD Chronic respiratory failure with hypoxia -Currently stable -Continue current nebs  Diabetes mellitus type II with hyperglycemia -Continue CBGs with SSI.  A1c 12.9  Goals of care -Discussed with patient who is more awake and responsive and agrees for DNR.  Consult palliative care for goals of care discussion.    DVT prophylaxis: Heparin subcutaneous Code Status: Full Family Communication: None at bedside Disposition Plan: Status is: Inpatient Remains inpatient appropriate because: Of severity of illness    Consultants: ID/podiatry/neurosurgery.  Consult palliative care  Procedures: 2D echo  Antimicrobials:  Anti-infectives (From admission, onward)    Start     Dose/Rate Route Frequency Ordered Stop   04/05/22 1700  Ampicillin-Sulbactam (UNASYN) 3 g in sodium chloride 0.9 % 100 mL IVPB        3 g 200 mL/hr over 30 Minutes Intravenous Every 6 hours 04/05/22 1643     04/05/22 0800  cefTRIAXone (ROCEPHIN) 1 g in sodium chloride 0.9 % 100 mL IVPB  Status:  Discontinued        1 g 200 mL/hr over 30 Minutes Intravenous Every 24 hours 04/05/22 0017 04/05/22 1643   04/04/22 2145  cefTRIAXone (ROCEPHIN) 1 g in sodium chloride 0.9 % 100 mL IVPB        1 g 200 mL/hr over 30 Minutes Intravenous  Once 04/04/22 2135 04/04/22 2240        Subjective: Patient seen and examined at bedside.  Awake, answers some questions, slow to respond.  Poor historian.  Feels slightly better.  Still feels weak.  Does not know why she is in the hospital.  No fever, vomiting or worsening shortness of breath  reported.  Objective: Vitals:   04/06/22 2050 04/07/22 0442 04/07/22 0655 04/07/22 1137  BP: 127/63 135/66  (!) 118/57  Pulse: (!) 102 97  (!) 107  Resp:    20  Temp: 98.8 F (37.1 C) 98.7 F (37.1 C)  98 F (36.7 C)  TempSrc: Oral Oral  Oral  SpO2: 99% 93% 94% 90%  Weight:      Height:        Intake/Output Summary (Last 24 hours) at 04/07/2022 1331 Last data filed at 04/07/2022 1254 Gross per 24 hour  Intake 480 ml  Output 1750 ml  Net -1270 ml   Filed Weights   04/04/22 1026 04/05/22 0034 04/05/22 0415  Weight: 54 kg 47.5 kg 47.5 kg    Examination:  General exam: Appears calm and comfortable.  Chronically ill and deconditioned. Respiratory system: Bilateral decreased breath sounds at bases with some scattered crackles Cardiovascular system: S1 & S2 heard, mild intermittent tachycardia present Gastrointestinal system: Abdomen is nondistended, soft and nontender. Normal bowel sounds heard. Extremities: No cyanosis, clubbing; trace lower extremity edema  Central nervous system: Awake, slightly slow to respond but does answer some questions appropriately.  Slightly confused to time.  No focal neurological deficits. Moving extremities Skin: Lateral aspect of the left foot is callused with no erythema or discharge Psychiatry: Flat affect.  Not agitated.  Data Reviewed:  I have personally reviewed following labs and imaging studies  CBC: Recent Labs  Lab 04/04/22 1140 04/05/22 0040 04/06/22 0402 04/07/22 0358  WBC 18.8* 11.8* 6.8 7.7  NEUTROABS  --  9.4*  --   --   HGB 15.1* 14.2 12.9 13.0  HCT 47.0* 46.4* 41.2 41.9  MCV 86.1 88.0 86.6 86.4  PLT 369 246 197 280   Basic Metabolic Panel: Recent Labs  Lab 04/04/22 1140 04/05/22 0040 04/06/22 0402 04/07/22 0358  NA 133* 135 135 134*  K 5.1 4.7 3.9 3.9  CL 96* 98 98 96*  CO2 25 25 28  32  GLUCOSE 438* 391* 211* 166*  BUN 31* 31* 16 16  CREATININE 0.72 0.85 0.62 0.69  CALCIUM 9.4 9.0 8.5* 8.5*  MG  --  1.8  --    --    GFR: Estimated Creatinine Clearance: 39.3 mL/min (by C-G formula based on SCr of 0.69 mg/dL). Liver Function Tests: Recent Labs  Lab 04/04/22 1816 04/05/22 0040  AST 11* 9*  ALT 7 9  ALKPHOS 59 63  BILITOT 0.8 0.8  PROT 6.3* 6.9  ALBUMIN 2.2* 2.5*   Recent Labs  Lab 04/04/22 1816  LIPASE 25   No results for input(s): "AMMONIA" in the last 168 hours. Coagulation Profile: No results for input(s): "INR", "PROTIME" in the last 168 hours. Cardiac Enzymes: No results for input(s): "CKTOTAL", "CKMB", "CKMBINDEX", "TROPONINI" in the last 168 hours. BNP (last 3 results) No results for input(s): "PROBNP" in the last 8760 hours. HbA1C: Recent Labs    04/05/22 0040  HGBA1C 12.9*   CBG: Recent Labs  Lab 04/06/22 2104 04/07/22 0013 04/07/22 0440 04/07/22 0726 04/07/22 1133  GLUCAP 277* 234* 211* 209* 358*   Lipid Profile: No results for input(s): "CHOL", "HDL", "LDLCALC", "TRIG", "CHOLHDL", "LDLDIRECT" in the last 72 hours. Thyroid Function Tests: No results for input(s): "TSH", "T4TOTAL", "FREET4", "T3FREE", "THYROIDAB" in the last 72 hours. Anemia Panel: No results for input(s): "VITAMINB12", "FOLATE", "FERRITIN", "TIBC", "IRON", "RETICCTPCT" in the last 72 hours. Sepsis Labs: Recent Labs  Lab 04/04/22 1816 04/05/22 0040  LATICACIDVEN 2.0* 1.9    Recent Results (from the past 240 hour(s))  Urine Culture     Status: Abnormal   Collection Time: 03/28/22  2:56 PM   Specimen: Urine, Clean Catch  Result Value Ref Range Status   Specimen Description   Final    URINE, CLEAN CATCH Performed at [facility], [place] [address] [place], [place] [address]    Special Requests   Final    NONE Performed at [facility], [place] [address] [place], [place] [address]    Culture (A)  Final    <10,000 COLONIES/mL INSIGNIFICANT GROWTH Performed at [facility] [address], [place], [place] [address]    Report Status  03/29/2022 FINAL  Final  Urine Culture     Status: None   Collection Time: 04/04/22  1:36 PM   Specimen: Urine, Clean Catch  Result Value Ref Range Status   Specimen Description   Final    URINE, CLEAN CATCH Performed at [facility], [place] [address] [place], [place] [address]    Special Requests   Final    NONE Performed at [facility], [place] [address] [place], [place] [address]    Culture   Final    NO GROWTH Performed at [facility], [place] [address] [place], [place] [address]    Report Status 04/05/2022  FINAL  Final  Culture, blood (single)     Status: Abnormal   Collection Time: 04/04/22  6:16 PM   Specimen: BLOOD  Result Value Ref Range Status   Specimen Description   Final    BLOOD SITE NOT SPECIFIED Performed at [facility] [address] [place], [place] [address]    Special Requests   Final    BOTTLES DRAWN AEROBIC AND ANAEROBIC Blood Culture results may not be optimal due to an inadequate volume of blood received in culture bottles Performed at [facility] [address], [place], [place] [address]    Culture  Setup Time   Final    GRAM POSITIVE COCCI IN CHAINS ANAEROBIC BOTTLE ONLY CRITICAL RESULT CALLED TO, READ BACK BY AND VERIFIED WITH: PHARMD C. SHADE [id number] @1607  FH GRAM POSITIVE COCCI IN CLUSTERS BOTTLES DRAWN AEROBIC ONLY CRITICAL RESULT CALLED TO, READ BACK BY AND VERIFIED WITH: PHARMD [place] 04/06/22 @ 0707 BY AB    Culture (A)  Final    ENTEROCOCCUS FAECALIS ROTHIA MUCILAGINOSA Standardized susceptibility testing for this organism is not available. Performed at [facility], [place] [address] [place], [place] [address]    Report Status 04/07/2022 FINAL  Final   Organism ID, Bacteria ENTEROCOCCUS FAECALIS  Final      Susceptibility   Enterococcus faecalis - MIC*    AMPICILLIN <=2 SENSITIVE Sensitive     VANCOMYCIN 2 SENSITIVE Sensitive     GENTAMICIN  SYNERGY SENSITIVE Sensitive     * ENTEROCOCCUS FAECALIS  Blood Culture ID Panel (Reflexed)     Status: Abnormal   Collection Time: 04/04/22  6:16 PM  Result Value Ref Range Status   Enterococcus faecalis DETECTED (A) NOT DETECTED Final    Comment: CRITICAL RESULT CALLED TO, READ BACK BY AND VERIFIED WITH: PHARMD C. SHADE [id number] @1607  FH    Enterococcus Faecium NOT DETECTED NOT DETECTED Final   Listeria monocytogenes NOT DETECTED NOT DETECTED Final   Staphylococcus species NOT DETECTED NOT DETECTED Final   Staphylococcus aureus (BCID) NOT DETECTED NOT DETECTED Final   Staphylococcus epidermidis NOT DETECTED NOT DETECTED Final   Staphylococcus lugdunensis NOT DETECTED NOT DETECTED Final   Streptococcus species DETECTED (A) NOT DETECTED Final    Comment: Not Enterococcus species, Streptococcus agalactiae, Streptococcus pyogenes, or Streptococcus pneumoniae. CRITICAL RESULT CALLED TO, READ BACK BY AND VERIFIED WITH: PHARMD C. SHADE [id number] @1607  FH    Streptococcus agalactiae NOT DETECTED NOT DETECTED Final   Streptococcus pneumoniae NOT DETECTED NOT DETECTED Final   Streptococcus pyogenes NOT DETECTED NOT DETECTED Final   A.calcoaceticus-baumannii NOT DETECTED NOT DETECTED Final   Bacteroides fragilis NOT DETECTED NOT DETECTED Final   Enterobacterales NOT DETECTED NOT DETECTED Final   Enterobacter cloacae complex NOT DETECTED NOT DETECTED Final   Escherichia coli NOT DETECTED NOT DETECTED Final   Klebsiella aerogenes NOT DETECTED NOT DETECTED Final   Klebsiella oxytoca NOT DETECTED NOT DETECTED Final   Klebsiella pneumoniae NOT DETECTED NOT DETECTED Final   Proteus species NOT DETECTED NOT DETECTED Final   Salmonella species NOT DETECTED NOT DETECTED Final   Serratia marcescens NOT DETECTED NOT DETECTED Final   Haemophilus influenzae NOT DETECTED NOT DETECTED Final   Neisseria meningitidis NOT DETECTED NOT DETECTED Final   Pseudomonas aeruginosa NOT DETECTED NOT DETECTED Final    Stenotrophomonas maltophilia NOT DETECTED NOT DETECTED Final   Candida albicans NOT DETECTED NOT DETECTED Final   Candida auris NOT DETECTED NOT DETECTED Final   Candida glabrata  NOT DETECTED NOT DETECTED Final   Candida krusei NOT DETECTED NOT DETECTED Final   Candida parapsilosis NOT DETECTED NOT DETECTED Final   Candida tropicalis NOT DETECTED NOT DETECTED Final   Cryptococcus neoformans/gattii NOT DETECTED NOT DETECTED Final   Vancomycin resistance NOT DETECTED NOT DETECTED Final    Comment: Performed at [facility], [place] [address], [place], [place] [address]  Blood culture (routine x 2)     Status: None (Preliminary result)   Collection Time: 04/04/22  9:36 PM   Specimen: BLOOD  Result Value Ref Range Status   Specimen Description   Final    BLOOD RIGHT ANTECUBITAL Performed at [facility] [address] [place], [place] [address]    Special Requests   Final    BOTTLES DRAWN AEROBIC ONLY Blood Culture results may not be optimal due to an inadequate volume of blood received in culture bottles Performed at [facility] [address], [place], [place] [address]    Culture   Final    NO GROWTH 2 DAYS Performed at [facility] [address], [place], [place] [address]    Report Status PENDING  Incomplete  Blood culture (routine x 2)     Status: None (Preliminary result)   Collection Time: 04/04/22  9:41 PM   Specimen: BLOOD  Result Value Ref Range Status   Specimen Description   Final    BLOOD LEFT ANTECUBITAL Performed at [facility] [address] [place], [place] [address]    Special Requests   Final    BOTTLES DRAWN AEROBIC ONLY Blood Culture results may not be optimal due to an inadequate volume of blood received in culture bottles Performed at [facility] [address], [place], [place] [address]    Culture   Final    NO GROWTH 2 DAYS Performed at [facility] [address] [place], [place] [address]    Report Status PENDING  Incomplete  Blood Culture ID Panel (Reflexed)     Status: None   Collection Time: 04/06/22  5:43 AM  Result Value Ref Range Status   Enterococcus faecalis NOT DETECTED NOT DETECTED Final   Enterococcus Faecium NOT DETECTED NOT DETECTED Final   Listeria monocytogenes NOT DETECTED NOT DETECTED Final   Staphylococcus species NOT DETECTED NOT DETECTED Final   Staphylococcus aureus (BCID) NOT DETECTED NOT DETECTED Final   Staphylococcus epidermidis NOT DETECTED NOT DETECTED Final   Staphylococcus lugdunensis NOT DETECTED NOT DETECTED Final   Streptococcus species NOT DETECTED NOT DETECTED Final   Streptococcus agalactiae NOT DETECTED NOT DETECTED Final   Streptococcus pneumoniae NOT DETECTED NOT DETECTED Final   Streptococcus pyogenes NOT DETECTED NOT DETECTED Final   A.calcoaceticus-baumannii NOT DETECTED NOT DETECTED Final   Bacteroides fragilis NOT DETECTED NOT DETECTED Final   Enterobacterales NOT DETECTED NOT DETECTED Final   Enterobacter cloacae complex NOT DETECTED NOT DETECTED Final   Escherichia coli NOT DETECTED NOT DETECTED Final   Klebsiella aerogenes NOT DETECTED NOT DETECTED Final   Klebsiella oxytoca NOT DETECTED NOT DETECTED Final   Klebsiella pneumoniae NOT DETECTED NOT DETECTED Final   Proteus species NOT DETECTED NOT DETECTED Final   Salmonella species NOT DETECTED NOT DETECTED Final   Serratia marcescens NOT DETECTED NOT DETECTED Final   Haemophilus influenzae NOT DETECTED NOT DETECTED Final   Neisseria meningitidis NOT DETECTED NOT DETECTED Final   Pseudomonas aeruginosa NOT DETECTED NOT DETECTED Final   Stenotrophomonas  maltophilia NOT DETECTED NOT DETECTED Final   Candida albicans NOT DETECTED NOT DETECTED Final   Candida auris NOT DETECTED NOT DETECTED Final   Candida glabrata NOT DETECTED NOT DETECTED Final   Candida krusei NOT DETECTED NOT DETECTED Final   Candida parapsilosis NOT DETECTED  NOT DETECTED Final   Candida tropicalis NOT DETECTED NOT DETECTED Final   Cryptococcus neoformans/gattii NOT DETECTED NOT DETECTED Final    Comment: Performed at [facility], [place] [address] [place], [place] [address]  Culture, blood (Routine X 2) w Reflex to ID Panel     Status: None (Preliminary result)   Collection Time: 04/06/22 12:02 PM   Specimen: BLOOD LEFT HAND  Result Value Ref Range Status   Specimen Description   Final    BLOOD LEFT HAND Performed at [facility] [address], [place], [place] [address]    Special Requests   Final    IN PEDIATRIC BOTTLE Blood Culture adequate volume Performed at [facility] [address] [place], [place] [address]    Culture   Final    NO GROWTH < 24 HOURS Performed at [facility] [address], [place], [place] [address]    Report Status PENDING  Incomplete  Culture, blood (Routine X 2) w Reflex to ID Panel     Status: None (Preliminary result)   Collection Time: 04/06/22 12:02 PM   Specimen: BLOOD LEFT ARM  Result Value Ref Range Status   Specimen Description   Final    BLOOD LEFT ARM Performed at [facility] [address], [place], [place] [address]    Special Requests   Final    IN PEDIATRIC BOTTLE Blood Culture adequate volume Performed at [facility] [address] [place], [place] [address]    Culture   Final    NO GROWTH < 24 HOURS Performed at [facility] [address] [place], [place] [address]    Report Status PENDING  Incomplete         Radiology Studies: CT THORACIC SPINE WO CONTRAST  Result Date: 04/06/2022 CLINICAL DATA:  Compression fracture EXAM: CT THORACIC SPINE WITHOUT CONTRAST TECHNIQUE: Multidetector CT images of the thoracic were obtained using the standard protocol without intravenous contrast. RADIATION DOSE REDUCTION: This exam was performed according to the departmental  dose-optimization program which includes automated exposure control, adjustment of the mA and/or kV according to patient size and/or use of iterative reconstruction technique. COMPARISON:  CT AP 04/04/22 FINDINGS: Alignment: Normal. Vertebrae: Redemonstrated acute superior endplate compression deformity of the T12 level with approximate 40% height loss. There is no evidence of retropulsion. Paraspinal and other soft tissues: Bibasilar atelectasis. Mild bilateral hydronephrosis. Disc levels: No evidence of high-grade spinal canal stenosis. IMPRESSION: 1. Redemonstrated acute superior endplate compression deformity at T12 with approximate 40% height loss. No evidence of retropulsion. 2. Mild bilateral hydronephrosis. Electronically Signed   By: Marin Roberts M.D.   On: 04/06/2022 13:47   ECHOCARDIOGRAM COMPLETE  Result Date: 04/06/2022    ECHOCARDIOGRAM REPORT   Patient Name:   TASHONA CALK Date of Exam: 04/06/2022 Medical Rec #:  [id number]        Height:       63.0 in Accession #:    [id number]       Weight:       104.7 lb Date of Birth:  Apr 25, 1936        BSA:  1.469 m Patient Age:    55 years         BP:           113/60 mmHg Patient Gender: F                HR:           78 bpm. Exam Location:  Inpatient Procedure: 2D Echo, Cardiac Doppler, Color Doppler and Intracardiac            Opacification Agent Indications:    Bacteremia  History:        Patient has prior history of Echocardiogram examinations, most                 recent 08/09/2021. Pacemaker and Abnormal ECG, COPD and Stroke,                 Arrythmias:Complete heart block, Signs/Symptoms:Altered Mental                 Status, Alzheimer's and Dizziness/Lightheadedness; Risk                 Factors:Diabetes, Hypertension and Dyslipidemia.  Sonographer:    Roseanna Rainbow RDCS Referring Phys: [id number] [facility]  Sonographer Comments: Technically difficult study due to poor echo windows. Difficult study due to patient sensitivity, ams, and  talking throughout exam. Exam delayed for patient care and machine send failure. IMPRESSIONS  1. Left ventricular ejection fraction, by estimation, is 45 to 50%. The left ventricle has mildly decreased function. The left ventricle demonstrates global hypokinesis. Left ventricular diastolic parameters are indeterminate.  2. Right ventricular systolic function is normal. The right ventricular size is normal. There is moderately elevated pulmonary artery systolic pressure.  3. The mitral valve is degenerative. No evidence of mitral valve regurgitation.  4. Leaflets thickened with mild calcification.  5. Aortic valve regurgitation is not visualized. Aortic valve sclerosis/calcification is present, without any evidence of aortic stenosis.  6. The inferior vena cava is dilated in size with <50% respiratory variability, suggesting right atrial pressure of 15 mmHg. Conclusion(s)/Recommendation(s): Has calcification on the aortic/tricuspid and mitral valves, appears unchanged from prior echo. FINDINGS  Left Ventricle: Left ventricular ejection fraction, by estimation, is 45 to 50%. The left ventricle has mildly decreased function. The left ventricle demonstrates global hypokinesis. The left ventricular internal cavity size was normal in size. There is  no left ventricular hypertrophy. Left ventricular diastolic parameters are indeterminate. Right Ventricle: The right ventricular size is normal. Right ventricular systolic function is normal. There is moderately elevated pulmonary artery systolic pressure. The tricuspid regurgitant velocity is 2.95 m/s, and with an assumed right atrial pressure of 15 mmHg, the estimated right ventricular systolic pressure is 77.9 mmHg. Left Atrium: Left atrial size was normal in size. Right Atrium: Right atrial size was normal in size. Pericardium: There is no evidence of pericardial effusion. Mitral Valve: The mitral valve is degenerative in appearance. There is mild thickening of the mitral  valve leaflet(s). There is mild calcification of the mitral valve leaflet(s). No evidence of mitral valve regurgitation. MV peak gradient, 11.8 mmHg. The  mean mitral valve gradient is 5.0 mmHg. Tricuspid Valve: Leaflets thickened with mild calcification. Tricuspid valve regurgitation is mild. Aortic Valve: Aortic valve regurgitation is not visualized. Aortic valve sclerosis/calcification is present, without any evidence of aortic stenosis. Pulmonic Valve: Pulmonic valve regurgitation is not visualized. Aorta: The aortic root and ascending aorta are structurally normal, with no evidence of dilitation. Venous: The inferior vena cava  is dilated in size with less than 50% respiratory variability, suggesting right atrial pressure of 15 mmHg. IAS/Shunts: The interatrial septum was not well visualized.  LEFT VENTRICLE PLAX 2D LVIDd:         4.00 cm LVIDs:         2.90 cm LV PW:         1.10 cm LV IVS:        1.60 cm LVOT diam:     2.10 cm LV SV:         36 LV SV Index:   25 LVOT Area:     3.46 cm  LV Volumes (MOD) LV vol d, MOD A2C: 80.7 ml LV vol d, MOD A4C: 70.2 ml LV vol s, MOD A2C: 41.4 ml LV vol s, MOD A4C: 35.1 ml LV SV MOD A2C:     39.3 ml LV SV MOD A4C:     70.2 ml LV SV MOD BP:      36.8 ml RIGHT VENTRICLE            IVC RV S prime:     9.55 cm/s  IVC diam: 3.10 cm TAPSE (M-mode): 1.6 cm LEFT ATRIUM             Index        RIGHT ATRIUM           Index LA diam:        4.10 cm 2.79 cm/m   RA Area:     11.30 cm LA Vol (A2C):   19.5 ml 13.28 ml/m  RA Volume:   24.90 ml  16.95 ml/m LA Vol (A4C):   20.0 ml 13.62 ml/m LA Biplane Vol: 21.6 ml 14.71 ml/m  AORTIC VALVE LVOT Vmax:   80.40 cm/s LVOT Vmean:  52.050 cm/s LVOT VTI:    0.104 m  AORTA Ao Root diam: 3.50 cm Ao Asc diam:  3.00 cm MITRAL VALVE                TRICUSPID VALVE MV Area (PHT): 4.89 cm     TR Peak grad:   34.8 mmHg MV Area VTI:   1.79 cm     TR Vmax:        295.00 cm/s MV Peak grad:  11.8 mmHg MV Mean grad:  5.0 mmHg     SHUNTS MV Vmax:        1.72 m/s     Systemic VTI:  0.10 m MV Vmean:      101.0 cm/s   Systemic Diam: 2.10 cm MV Decel Time: 155 msec MV E velocity: 153.00 cm/s Landscape architect signed by Phineas Inches Signature Date/Time: 04/06/2022/10:49:43 AM    Final         Scheduled Meds:  budesonide (PULMICORT) nebulizer solution  0.25 mg Nebulization BID   Chlorhexidine Gluconate Cloth  6 each Topical Daily   feeding supplement (GLUCERNA SHAKE)  237 mL Oral BID BM   heparin  5,000 Units Subcutaneous Q8H   insulin aspart  0-9 Units Subcutaneous Q4H   insulin glargine-yfgn  10 Units Subcutaneous QHS   ipratropium-albuterol  3 mL Nebulization BID   leptospermum manuka honey  1 Application Topical Daily   Ensure Max Protein  11 oz Oral Daily   Continuous Infusions:  ampicillin-sulbactam (UNASYN) IV 3 g (04/07/22 1120)          Aline August, MD [facility] 04/07/2022, 1:31 PM

## 2022-04-07 NOTE — Consult Note (Signed)
  Subjective:  Patient ID: [name], female    DOB: 07-31-1935,  MRN: [id number]  A 86 y.o. female  with hx of uncontrolled type 2 DM, history of complete heart block status post pacemaker, chronic protein calorie malnutrition, chronic respiratory failure presents with left submetatarsal 5 eschar with underlying concern for wound.  Patient is also bacteremic.  She denies any other acute complaints she is also experiencing altered mental status.  Objective:   Vitals:   04/07/22 0655 04/07/22 1137  BP:  (!) 118/57  Pulse:  (!) 107  Resp:  20  Temp:  98 F (36.7 C)  SpO2: 94% 90%   General AA&O x3. Normal mood and affect.  Vascular Dorsalis pedis and posterior tibial pulses 2/4 bilat. Brisk capillary refill to all digits. Pedal hair present.  Neurologic Epicritic sensation grossly intact.  Dermatologic Left lateral submetatarsal 5 ulceration probing down to deep tissue/bone.  No purulent drainage noted no malodor present.  Mild erythema noted  Orthopedic: MMT 5/5 in dorsiflexion, plantarflexion, inversion, and eversion. Normal joint ROM without pain or crepitus.   IMPRESSION: 1. Soft tissue wound overlying the lateral aspect of the fifth MTP joint. Cortical irregularity of the fifth metatarsal head concerning for osteomyelitis. 2. Diffuse soft tissue edema of the left foot and ankle which may be secondary to fluid overload versus cellulitis.    Assessment & Plan:  Patient was evaluated and treated and all questions answered.  Left submetatarsal 5 ulceration with possible underlying osteomyelitis -All questions and concerns were discussed with the patient in extensive detail -Patient will benefit from an MRI of the left foot to assess the severity of osteomyelitis for possible amputation in the future. -At this moment patient was refusing amputation however she may be experiencing altered mental status as well. -Continue antibiotics per infectious disease -She will also  need ABIs PVRs and vascular flow work-up as well given the nature of the ulceration that is present -She is a high risk of losing digit versus the foot versus the leg.  I discussed this with the patient -Local wound care with Betadine wet-to-dry dressing  Felipa Furnace, DPM  Accessible via secure chat for questions or concerns.

## 2022-04-07 NOTE — Plan of Care (Signed)
Patient remains confused and forgetful.  Skin as documented with dressings intact and clean.iv in right wrist.  Iv infusing.  Foley intact and draining with cloudy yellow urine

## 2022-04-07 NOTE — Progress Notes (Addendum)
[facility] Infectious Diseases Follow Up Note  Patient Identification: Patient Name: [name] MRN: [id number] [place] Date: 04/04/2022 10:45 AM Age: 86 y.o.Today's Date: 04/07/2022  Reason for Visit: Polymicrobial bacteremia  Principal Problem:   Acute cystitis without hematuria Active Problems:   Uncontrolled type 2 diabetes mellitus with hyperglycemia (HCC)   Chronic respiratory failure with hypoxia (HCC) - home O2 of 2-3 L/min   Acute metabolic encephalopathy   Pacemaker   Sepsis without acute organ dysfunction (HCC)   Protein-calorie malnutrition, severe   Acute urinary retention   T12 vertebral fracture (HCC)  Antibiotics:  Ceftriaxone 11/28- 11/30 Unasyn 11/29-c   Lines/Hardware: PPM +   Interval Events: Continues to be afebrile, no leukocytosis   Assessment 86 year old female with PMH as below including 2 DM, complete heart block status post PPM, chronic PEM, chronic respiratory failure on home 3 L oxygen , chronic wounds including left metatarsal head osteomyelitis followed by Dr. Drucilla Schmidt on doxycycline and cefadroxil who was brought to the ED on 11/28 with weakness, nausea, lower abdominal pain, diarrhea and poor p.o. intake for a week.  Now with    # Polymicrobial bacteremia ( strep , Enetrococcus faecalis, GPC in clusters in aerobic spp which is not identified by BCID)  11/28 18:16 Blood cx ( unspecified site) E faecalis ( GPC in chains in anaerobic bottles and GPC in clusters in aerobic bottle). Patient had BCID run on blood sample from 11/28 1816 hrs on 11/30 at 5:43 ( no staph spp detected.). D/w Id Pharm D   11/28 21:36 and 21:41 NGTD  Of note, all blood cx on 11/28 were drawn before abtx    # Acute metabolic encephalopathy - improved, oriented to month, year and situation. She does not remember the abtx she is taking or why she is in the hospital but says she had burning. She follows commands  appropriately    # RT lateral foot Osteomyelitis/Uncontrolled DM ( A1c 12.9) Has healed with a callus with no active infection currently  Seems to have  got 7+ weeks of doxycycline and cefadroxil since 11/7  No need for abtx for Rt foot  Fu with Podiatry  # Left hip Ulcer/stage 1 sacral ulcer - superficial and does not appear to be  infected   Recommendations Continue Unasyn as is. Fu repeat blood cx 11/30  TTE very limited study, no obvious vegetations or evidence of endocarditis. D/w Cardiology Dr Harl Bowie, aortic/tricuspid/mitral valves appear calcified and unchanged from prior echo  [facility] wait for repeat blood cx in 11/30 for at least 72 hrs to decide if TEE indicated esp due to low suspicion for endocarditis with polymicrobial blood cx  and patients presenting symptoms of diarrhea and dysuria/acute urinary retention requiring Foleys which could be the source.  Will need neurosurgery consult for acute T spine #.   Following peripherally over the weekend, please call with questions  Rest of the management as per the primary team. Thank you for the consult. Please page with pertinent questions or concerns.  ______________________________________________________________________ Subjective patient seen and examined at the bedside. She is hungry and asking for breakfast. She previously had dysuria ( resolved). Diarrhea resolved. No concerns otherwise.   Vitals BP 135/66 (BP Location: Left Arm)   Pulse 97   Temp 98.7 F (37.1 C) (Oral)   Resp 20   Ht 5' 3"  (1.6 m)   Wt 47.5 kg   SpO2 94%   BMI 18.55 kg/m    Physical Exam Constitutional: Caucasian elderly female  sitting up in the bed, appears comfortable    Comments:   Cardiovascular:     Rate and Rhythm: Normal rate and regular rhythm.     Heart sounds: PPM site with no overlying erythema, tenderness or fluctuance  Pulmonary:     Effort: Pulmonary effort is normal on room air    Comments: Normal breath  sounds  Abdominal:     Palpations: Abdomen is soft.     Tenderness: Nontender and nondistended.  Foley is draining yellow urine in Urobag  Musculoskeletal:        General: No swelling or tenderness in peripheral joints.  No signs of septic arthritis Left foot has a chronic appearing callus in the lateral fifth metatarsal with no signs of acute infection No wound or ulcer in the right foot  Skin:    Comments: Mild superficial wound in the left hip, stage I sacral ulcer both lung them does not appear to be infected  Neurological:     General: Grossly nonfocal, awake alert, oriented to name place month and year.  Follows commands appropriately  Psychiatric:        Mood and Affect: Mood normal.   Pertinent Microbiology Results for orders placed or performed during the hospital encounter of 04/04/22  Urine Culture     Status: None   Collection Time: 04/04/22  1:36 PM   Specimen: Urine, Clean Catch  Result Value Ref Range Status   Specimen Description   Final    URINE, CLEAN CATCH Performed at [facility], [place] [address] [place], [place] [address]    Special Requests   Final    NONE Performed at [facility], [place] [address], [place], [place] [address]    Culture   Final    NO GROWTH Performed at [facility], [place] [address], [place], [place] [address]    Report Status 04/05/2022 FINAL  Final  Culture, blood (single)     Status: Abnormal (Preliminary result)   Collection Time: 04/04/22  6:16 PM   Specimen: BLOOD  Result Value Ref Range Status   Specimen Description   Final    BLOOD SITE NOT SPECIFIED Performed at [place] [address], [place], [place] [address]    Special Requests   Final    BOTTLES DRAWN AEROBIC AND ANAEROBIC Blood Culture results may not be optimal due to an inadequate volume of blood received in culture bottles Performed at [place] [address],  [place], [place] [address]    Culture  Setup Time   Final    GRAM POSITIVE COCCI IN CHAINS ANAEROBIC BOTTLE ONLY CRITICAL RESULT CALLED TO, READ BACK BY AND VERIFIED WITH: PHARMD C. SHADE [id number] @1607  FH GRAM POSITIVE COCCI IN CLUSTERS BOTTLES DRAWN AEROBIC ONLY CRITICAL RESULT CALLED TO, READ BACK BY AND VERIFIED WITH: PHARMD GADHIA 04/06/22 @ 0707 BY AB    Culture (A)  Final    ENTEROCOCCUS FAECALIS CULTURE REINCUBATED FOR BETTER GROWTH Performed at [facility], [place] [address] [place], [place] [address]    Report Status PENDING  Incomplete   Organism ID, Bacteria ENTEROCOCCUS FAECALIS  Final      Susceptibility   Enterococcus faecalis - MIC*    AMPICILLIN <=2 SENSITIVE Sensitive     VANCOMYCIN 2 SENSITIVE Sensitive     GENTAMICIN SYNERGY SENSITIVE Sensitive     * ENTEROCOCCUS FAECALIS  Blood Culture ID Panel (Reflexed)     Status: Abnormal  Collection Time: 04/04/22  6:16 PM  Result Value Ref Range Status   Enterococcus faecalis DETECTED (A) NOT DETECTED Final    Comment: CRITICAL RESULT CALLED TO, READ BACK BY AND VERIFIED WITH: PHARMD C. SHADE [id number] @1607  FH    Enterococcus Faecium NOT DETECTED NOT DETECTED Final   Listeria monocytogenes NOT DETECTED NOT DETECTED Final   Staphylococcus species NOT DETECTED NOT DETECTED Final   Staphylococcus aureus (BCID) NOT DETECTED NOT DETECTED Final   Staphylococcus epidermidis NOT DETECTED NOT DETECTED Final   Staphylococcus lugdunensis NOT DETECTED NOT DETECTED Final   Streptococcus species DETECTED (A) NOT DETECTED Final    Comment: Not Enterococcus species, Streptococcus agalactiae, Streptococcus pyogenes, or Streptococcus pneumoniae. CRITICAL RESULT CALLED TO, READ BACK BY AND VERIFIED WITH: PHARMD C. SHADE [id number] @1607  FH    Streptococcus agalactiae NOT DETECTED NOT DETECTED Final   Streptococcus pneumoniae NOT DETECTED NOT DETECTED Final   Streptococcus pyogenes NOT DETECTED NOT DETECTED Final    A.calcoaceticus-baumannii NOT DETECTED NOT DETECTED Final   Bacteroides fragilis NOT DETECTED NOT DETECTED Final   Enterobacterales NOT DETECTED NOT DETECTED Final   Enterobacter cloacae complex NOT DETECTED NOT DETECTED Final   Escherichia coli NOT DETECTED NOT DETECTED Final   Klebsiella aerogenes NOT DETECTED NOT DETECTED Final   Klebsiella oxytoca NOT DETECTED NOT DETECTED Final   Klebsiella pneumoniae NOT DETECTED NOT DETECTED Final   Proteus species NOT DETECTED NOT DETECTED Final   Salmonella species NOT DETECTED NOT DETECTED Final   Serratia marcescens NOT DETECTED NOT DETECTED Final   Haemophilus influenzae NOT DETECTED NOT DETECTED Final   Neisseria meningitidis NOT DETECTED NOT DETECTED Final   Pseudomonas aeruginosa NOT DETECTED NOT DETECTED Final   Stenotrophomonas maltophilia NOT DETECTED NOT DETECTED Final   Candida albicans NOT DETECTED NOT DETECTED Final   Candida auris NOT DETECTED NOT DETECTED Final   Candida glabrata NOT DETECTED NOT DETECTED Final   Candida krusei NOT DETECTED NOT DETECTED Final   Candida parapsilosis NOT DETECTED NOT DETECTED Final   Candida tropicalis NOT DETECTED NOT DETECTED Final   Cryptococcus neoformans/gattii NOT DETECTED NOT DETECTED Final   Vancomycin resistance NOT DETECTED NOT DETECTED Final    Comment: Performed at [facility], [address], [place], [place] [address]  Blood culture (routine x 2)     Status: None (Preliminary result)   Collection Time: 04/04/22  9:36 PM   Specimen: BLOOD  Result Value Ref Range Status   Specimen Description   Final    BLOOD RIGHT ANTECUBITAL Performed at [place] [address], [place], [place] [address]    Special Requests   Final    BOTTLES DRAWN AEROBIC ONLY Blood Culture results may not be optimal due to an inadequate volume of blood received in culture bottles Performed at [place] [address] [place], [place] [address]    Culture    Final    NO GROWTH 2 DAYS Performed at [place] [address], [place], [place] [address]    Report Status PENDING  Incomplete  Blood culture (routine x 2)     Status: None (Preliminary result)   Collection Time: 04/04/22  9:41 PM   Specimen: BLOOD  Result Value Ref Range Status   Specimen Description   Final    BLOOD LEFT ANTECUBITAL Performed at [place] [address], [place], [place] [address]    Special Requests   Final    BOTTLES DRAWN AEROBIC  ONLY Blood Culture results may not be optimal due to an inadequate volume of blood received in culture bottles Performed at [facility], [place] [address], [place], [place] [address]    Culture   Final    NO GROWTH 2 DAYS Performed at [facility] [address] [place], [place] [address]    Report Status PENDING  Incomplete  Blood Culture ID Panel (Reflexed)     Status: None   Collection Time: 04/06/22  5:43 AM  Result Value Ref Range Status   Enterococcus faecalis NOT DETECTED NOT DETECTED Final   Enterococcus Faecium NOT DETECTED NOT DETECTED Final   Listeria monocytogenes NOT DETECTED NOT DETECTED Final   Staphylococcus species NOT DETECTED NOT DETECTED Final   Staphylococcus aureus (BCID) NOT DETECTED NOT DETECTED Final   Staphylococcus epidermidis NOT DETECTED NOT DETECTED Final   Staphylococcus lugdunensis NOT DETECTED NOT DETECTED Final   Streptococcus species NOT DETECTED NOT DETECTED Final   Streptococcus agalactiae NOT DETECTED NOT DETECTED Final   Streptococcus pneumoniae NOT DETECTED NOT DETECTED Final   Streptococcus pyogenes NOT DETECTED NOT DETECTED Final   A.calcoaceticus-baumannii NOT DETECTED NOT DETECTED Final   Bacteroides fragilis NOT DETECTED NOT DETECTED Final   Enterobacterales NOT DETECTED NOT DETECTED Final   Enterobacter cloacae complex NOT DETECTED NOT DETECTED Final   Escherichia coli NOT DETECTED NOT DETECTED Final   Klebsiella aerogenes  NOT DETECTED NOT DETECTED Final   Klebsiella oxytoca NOT DETECTED NOT DETECTED Final   Klebsiella pneumoniae NOT DETECTED NOT DETECTED Final   Proteus species NOT DETECTED NOT DETECTED Final   Salmonella species NOT DETECTED NOT DETECTED Final   Serratia marcescens NOT DETECTED NOT DETECTED Final   Haemophilus influenzae NOT DETECTED NOT DETECTED Final   Neisseria meningitidis NOT DETECTED NOT DETECTED Final   Pseudomonas aeruginosa NOT DETECTED NOT DETECTED Final   Stenotrophomonas maltophilia NOT DETECTED NOT DETECTED Final   Candida albicans NOT DETECTED NOT DETECTED Final   Candida auris NOT DETECTED NOT DETECTED Final   Candida glabrata NOT DETECTED NOT DETECTED Final   Candida krusei NOT DETECTED NOT DETECTED Final   Candida parapsilosis NOT DETECTED NOT DETECTED Final   Candida tropicalis NOT DETECTED NOT DETECTED Final   Cryptococcus neoformans/gattii NOT DETECTED NOT DETECTED Final    Comment: Performed at [facility], [place] [address] [place], [place] [address]  Culture, blood (Routine X 2) w Reflex to ID Panel     Status: None (Preliminary result)   Collection Time: 04/06/22 12:02 PM   Specimen: BLOOD LEFT HAND  Result Value Ref Range Status   Specimen Description   Final    BLOOD LEFT HAND Performed at [place] [address], [place], [place] [address]    Special Requests   Final    IN PEDIATRIC BOTTLE Blood Culture adequate volume Performed at [place] [address] [place], [place] [address]    Culture   Final    NO GROWTH < 24 HOURS Performed at [place] [address] [place],  [address]    Report Status PENDING  Incomplete  Culture, blood (Routine X 2) w Reflex to ID Panel     Status: None (Preliminary result)   Collection Time: 04/06/22 12:02 PM   Specimen: BLOOD LEFT ARM  Result Value Ref Range Status   Specimen Description   Final    BLOOD LEFT ARM Performed at [facility]  [address] [place], [place] [address]    Special Requests   Final    IN PEDIATRIC BOTTLE Blood Culture adequate volume Performed at [place] [address] [place], [place] [address]    Culture   Final    NO GROWTH < 24 HOURS Performed at [place] [address] [place],  [address]    Report Status PENDING  Incomplete   *Note: Due to a large number of results and/or encounters for the requested time period, some results have not been displayed. A complete set of results can be found in Results Review.   Pertinent Lab.    Latest Ref Rng & Units 04/07/2022    3:58 AM 04/06/2022    4:02 AM 04/05/2022   12:40 AM  CBC  WBC 4.0 - 10.5 K/uL 7.7  6.8  11.8   Hemoglobin 12.0 - 15.0 g/dL 13.0  12.9  14.2   Hematocrit 36.0 - 46.0 % 41.9  41.2  46.4   Platelets 150 - 400 K/uL 194  197  246       Latest Ref Rng & Units 04/07/2022    3:58 AM 04/06/2022    4:02 AM 04/05/2022   12:40 AM  CMP  Glucose 70 - 99 mg/dL 166  211  391   BUN 8 - 23 mg/dL 16  16  31    Creatinine 0.44 - 1.00 mg/dL 0.69  0.62  0.85   Sodium 135 - 145 mmol/L 134  135  135   Potassium 3.5 - 5.1 mmol/L 3.9  3.9  4.7   Chloride 98 - 111 mmol/L 96  98  98   CO2 22 - 32 mmol/L 32  28  25   Calcium 8.9 - 10.3 mg/dL 8.5  8.5  9.0   Total Protein 6.5 - 8.1 g/dL   6.9   Total Bilirubin 0.3 - 1.2 mg/dL   0.8   Alkaline Phos 38 - 126 U/L   63   AST 15 - 41 U/L   9   ALT 0 - 44 U/L   9      Pertinent Imaging today Plain films and CT images have been personally visualized and interpreted; radiology reports have been reviewed. Decision making incorporated into the Impression / Recommendations.  CT THORACIC SPINE WO CONTRAST  Result Date: 04/06/2022 CLINICAL DATA:  Compression fracture EXAM: CT THORACIC SPINE WITHOUT CONTRAST TECHNIQUE: Multidetector CT images of the thoracic were obtained using the standard protocol without intravenous contrast.  RADIATION DOSE REDUCTION: This exam was performed according to the departmental dose-optimization program which includes automated exposure control, adjustment of the mA and/or kV according to patient size and/or use of iterative reconstruction technique. COMPARISON:  CT AP 04/04/22 FINDINGS: Alignment: Normal. Vertebrae: Redemonstrated acute superior endplate compression deformity of the T12 level with approximate 40% height loss. There is no evidence of retropulsion. Paraspinal and other soft tissues: Bibasilar atelectasis. Mild bilateral hydronephrosis. Disc levels: No evidence of high-grade spinal canal stenosis. IMPRESSION: 1. Redemonstrated acute superior endplate compression deformity at T12 with approximate 40% height loss. No evidence of retropulsion. 2. Mild bilateral hydronephrosis. Electronically Signed   By: Marin Roberts M.D.   On: 04/06/2022 13:47   ECHOCARDIOGRAM COMPLETE  Result Date: 04/06/2022    ECHOCARDIOGRAM REPORT   Patient Name:   JAYDEN RUDGE Date of Exam: 04/06/2022 Medical Rec #:  [id number]        Height:       63.0 in Accession #:  [id number]       Weight:       104.7 lb Date of Birth:  Sep 09, 1935        BSA:          1.469 m Patient Age:    26 years         BP:           113/60 mmHg Patient Gender: F                HR:           78 bpm. Exam Location:  Inpatient Procedure: 2D Echo, Cardiac Doppler, Color Doppler and Intracardiac            Opacification Agent Indications:    Bacteremia  History:        Patient has prior history of Echocardiogram examinations, most                 recent 08/09/2021. Pacemaker and Abnormal ECG, COPD and Stroke,                 Arrythmias:Complete heart block, Signs/Symptoms:Altered Mental                 Status, Alzheimer's and Dizziness/Lightheadedness; Risk                 Factors:Diabetes, Hypertension and Dyslipidemia.  Sonographer:    Roseanna Rainbow RDCS Referring Phys: [id number] [facility]  Sonographer Comments: Technically difficult study  due to poor echo windows. Difficult study due to patient sensitivity, ams, and talking throughout exam. Exam delayed for patient care and machine send failure. IMPRESSIONS  1. Left ventricular ejection fraction, by estimation, is 45 to 50%. The left ventricle has mildly decreased function. The left ventricle demonstrates global hypokinesis. Left ventricular diastolic parameters are indeterminate.  2. Right ventricular systolic function is normal. The right ventricular size is normal. There is moderately elevated pulmonary artery systolic pressure.  3. The mitral valve is degenerative. No evidence of mitral valve regurgitation.  4. Leaflets thickened with mild calcification.  5. Aortic valve regurgitation is not visualized. Aortic valve sclerosis/calcification is present, without any evidence of aortic stenosis.  6. The inferior vena cava is dilated in size with <50% respiratory variability, suggesting right atrial pressure of 15 mmHg. Conclusion(s)/Recommendation(s): Has calcification on the aortic/tricuspid and mitral valves, appears unchanged from prior echo. FINDINGS  Left Ventricle: Left ventricular ejection fraction, by estimation, is 45 to 50%. The left ventricle has mildly decreased function. The left ventricle demonstrates global hypokinesis. The left ventricular internal cavity size was normal in size. There is  no left ventricular hypertrophy. Left ventricular diastolic parameters are indeterminate. Right Ventricle: The right ventricular size is normal. Right ventricular systolic function is normal. There is moderately elevated pulmonary artery systolic pressure. The tricuspid regurgitant velocity is 2.95 m/s, and with an assumed right atrial pressure of 15 mmHg, the estimated right ventricular systolic pressure is 42.7 mmHg. Left Atrium: Left atrial size was normal in size. Right Atrium: Right atrial size was normal in size. Pericardium: There is no evidence of pericardial effusion. Mitral Valve: The  mitral valve is degenerative in appearance. There is mild thickening of the mitral valve leaflet(s). There is mild calcification of the mitral valve leaflet(s). No evidence of mitral valve regurgitation. MV peak gradient, 11.8 mmHg. The  mean mitral valve gradient is 5.0 mmHg. Tricuspid Valve: Leaflets thickened with mild calcification. Tricuspid valve regurgitation is mild. Aortic Valve: Aortic valve regurgitation is not visualized. Aortic  valve sclerosis/calcification is present, without any evidence of aortic stenosis. Pulmonic Valve: Pulmonic valve regurgitation is not visualized. Aorta: The aortic root and ascending aorta are structurally normal, with no evidence of dilitation. Venous: The inferior vena cava is dilated in size with less than 50% respiratory variability, suggesting right atrial pressure of 15 mmHg. IAS/Shunts: The interatrial septum was not well visualized.  LEFT VENTRICLE PLAX 2D LVIDd:         4.00 cm LVIDs:         2.90 cm LV PW:         1.10 cm LV IVS:        1.60 cm LVOT diam:     2.10 cm LV SV:         36 LV SV Index:   25 LVOT Area:     3.46 cm  LV Volumes (MOD) LV vol d, MOD A2C: 80.7 ml LV vol d, MOD A4C: 70.2 ml LV vol s, MOD A2C: 41.4 ml LV vol s, MOD A4C: 35.1 ml LV SV MOD A2C:     39.3 ml LV SV MOD A4C:     70.2 ml LV SV MOD BP:      36.8 ml RIGHT VENTRICLE            IVC RV S prime:     9.55 cm/s  IVC diam: 3.10 cm TAPSE (M-mode): 1.6 cm LEFT ATRIUM             Index        RIGHT ATRIUM           Index LA diam:        4.10 cm 2.79 cm/m   RA Area:     11.30 cm LA Vol (A2C):   19.5 ml 13.28 ml/m  RA Volume:   24.90 ml  16.95 ml/m LA Vol (A4C):   20.0 ml 13.62 ml/m LA Biplane Vol: 21.6 ml 14.71 ml/m  AORTIC VALVE LVOT Vmax:   80.40 cm/s LVOT Vmean:  52.050 cm/s LVOT VTI:    0.104 m  AORTA Ao Root diam: 3.50 cm Ao Asc diam:  3.00 cm MITRAL VALVE                TRICUSPID VALVE MV Area (PHT): 4.89 cm     TR Peak grad:   34.8 mmHg MV Area VTI:   1.79 cm     TR Vmax:        295.00  cm/s MV Peak grad:  11.8 mmHg MV Mean grad:  5.0 mmHg     SHUNTS MV Vmax:       1.72 m/s     Systemic VTI:  0.10 m MV Vmean:      101.0 cm/s   Systemic Diam: 2.10 cm MV Decel Time: 155 msec MV E velocity: 153.00 cm/s Phineas Inches Electronically signed by Phineas Inches Signature Date/Time: 04/06/2022/10:49:43 AM    Final      I spent more 51  minutes for this patient encounter including review of prior medical records, coordination of care with primary/other specialist with greater than 50% of time being face to face/counseling and discussing diagnostics/treatment plan with the patient/family.  Electronically signed by:   Rosiland Oz, MD Infectious Disease Physician [facility] Pager: [phone]

## 2022-04-07 NOTE — Inpatient Diabetes Management (Signed)
Inpatient Diabetes Program Recommendations  AACE/ADA: New Consensus Statement on Inpatient Glycemic Control (2015)  Target Ranges:  Prepandial:   less than 140 mg/dL      Peak postprandial:   less than 180 mg/dL (1-2 hours)      Critically ill patients:  140 - 180 mg/dL   Lab Results  Component Value Date   GLUCAP 358 (H) 04/07/2022   HGBA1C 12.9 (H) 04/05/2022    Review of Glycemic Control  Diabetes history: DM2 Outpatient Diabetes medications: Tresiba 26 QHS, Novolog 5 units TID with meals Current orders for Inpatient glycemic control: Semglee 10 QHS, Novolog 0-9 units Q4H  HgbA1C - 12.9%  Inpatient Diabetes Program Recommendations:    Increase Semglee to 12 units QHS  Add Novolog 3 units TID with meals if eating > 50%  Needs tight glycemic control for healing.  Continue to follow.  Thank you. Lorenda Peck, RD, LDN, [place] Inpatient Diabetes Coordinator [phone]

## 2022-04-08 ENCOUNTER — Inpatient Hospital Stay (HOSPITAL_COMMUNITY): Payer: Medicare Other

## 2022-04-08 DIAGNOSIS — R531 Weakness: Secondary | ICD-10-CM | POA: Diagnosis not present

## 2022-04-08 DIAGNOSIS — Z7189 Other specified counseling: Secondary | ICD-10-CM

## 2022-04-08 DIAGNOSIS — A419 Sepsis, unspecified organism: Secondary | ICD-10-CM | POA: Diagnosis not present

## 2022-04-08 DIAGNOSIS — G9341 Metabolic encephalopathy: Secondary | ICD-10-CM | POA: Diagnosis not present

## 2022-04-08 DIAGNOSIS — S22089A Unspecified fracture of T11-T12 vertebra, initial encounter for closed fracture: Secondary | ICD-10-CM | POA: Diagnosis not present

## 2022-04-08 DIAGNOSIS — M86672 Other chronic osteomyelitis, left ankle and foot: Secondary | ICD-10-CM | POA: Diagnosis not present

## 2022-04-08 DIAGNOSIS — N3000 Acute cystitis without hematuria: Secondary | ICD-10-CM | POA: Diagnosis not present

## 2022-04-08 LAB — C-REACTIVE PROTEIN: CRP: 6 mg/dL — ABNORMAL HIGH (ref <1.0)

## 2022-04-08 LAB — CBC WITH DIFFERENTIAL/PLATELET
Abs Immature Granulocytes: 0.04 10*3/uL (ref 0.00–0.07)
Basophils Absolute: 0.1 10*3/uL (ref 0.0–0.1)
Basophils Relative: 1 %
Eosinophils Absolute: 0.1 10*3/uL (ref 0.0–0.5)
Eosinophils Relative: 2 %
HCT: 42.3 % (ref 36.0–46.0)
Hemoglobin: 13.5 g/dL (ref 12.0–15.0)
Immature Granulocytes: 1 %
Lymphocytes Relative: 27 %
Lymphs Abs: 1.8 10*3/uL (ref 0.7–4.0)
MCH: 27.6 pg (ref 26.0–34.0)
MCHC: 31.9 g/dL (ref 30.0–36.0)
MCV: 86.5 fL (ref 80.0–100.0)
Monocytes Absolute: 0.5 10*3/uL (ref 0.1–1.0)
Monocytes Relative: 8 %
Neutro Abs: 4.1 10*3/uL (ref 1.7–7.7)
Neutrophils Relative %: 61 %
Platelets: 176 10*3/uL (ref 150–400)
RBC: 4.89 MIL/uL (ref 3.87–5.11)
RDW: 14.5 % (ref 11.5–15.5)
WBC: 6.6 10*3/uL (ref 4.0–10.5)
nRBC: 0 % (ref 0.0–0.2)

## 2022-04-08 LAB — COMPREHENSIVE METABOLIC PANEL
ALT: 9 U/L (ref 0–44)
AST: 13 U/L — ABNORMAL LOW (ref 15–41)
Albumin: 2.2 g/dL — ABNORMAL LOW (ref 3.5–5.0)
Alkaline Phosphatase: 55 U/L (ref 38–126)
Anion gap: 7 (ref 5–15)
BUN: 18 mg/dL (ref 8–23)
CO2: 35 mmol/L — ABNORMAL HIGH (ref 22–32)
Calcium: 9 mg/dL (ref 8.9–10.3)
Chloride: 94 mmol/L — ABNORMAL LOW (ref 98–111)
Creatinine, Ser: 0.7 mg/dL (ref 0.44–1.00)
GFR, Estimated: >60 mL/min (ref >60)
Glucose, Bld: 174 mg/dL — ABNORMAL HIGH (ref 70–99)
Potassium: 4 mmol/L (ref 3.5–5.1)
Sodium: 136 mmol/L (ref 135–145)
Total Bilirubin: 0.2 mg/dL — ABNORMAL LOW (ref 0.3–1.2)
Total Protein: 6.1 g/dL — ABNORMAL LOW (ref 6.5–8.1)

## 2022-04-08 LAB — GLUCOSE, CAPILLARY
Glucose-Capillary: 160 mg/dL — ABNORMAL HIGH (ref 70–99)
Glucose-Capillary: 186 mg/dL — ABNORMAL HIGH (ref 70–99)
Glucose-Capillary: 204 mg/dL — ABNORMAL HIGH (ref 70–99)
Glucose-Capillary: 220 mg/dL — ABNORMAL HIGH (ref 70–99)
Glucose-Capillary: 279 mg/dL — ABNORMAL HIGH (ref 70–99)
Glucose-Capillary: 286 mg/dL — ABNORMAL HIGH (ref 70–99)
Glucose-Capillary: 319 mg/dL — ABNORMAL HIGH (ref 70–99)

## 2022-04-08 LAB — MAGNESIUM: Magnesium: 1.4 mg/dL — ABNORMAL LOW (ref 1.7–2.4)

## 2022-04-08 MED ORDER — SENNOSIDES-DOCUSATE SODIUM 8.6-50 MG PO TABS
1.0000 | ORAL_TABLET | Freq: Two times a day (BID) | ORAL | Status: DC
  Administered 2022-04-08 – 2022-04-27 (×37): 1 via ORAL
  Filled 2022-04-08 (×38): qty 1

## 2022-04-08 MED ORDER — BISACODYL 10 MG RE SUPP
10.0000 mg | Freq: Every day | RECTAL | Status: DC | PRN
  Filled 2022-04-08: qty 1

## 2022-04-08 MED ORDER — POLYETHYLENE GLYCOL 3350 17 G PO PACK
17.0000 g | PACK | Freq: Two times a day (BID) | ORAL | Status: DC
  Administered 2022-04-08 – 2022-04-27 (×30): 17 g via ORAL
  Filled 2022-04-08 (×36): qty 1

## 2022-04-08 MED ORDER — MAGNESIUM SULFATE 2 GM/50ML IV SOLN
2.0000 g | Freq: Once | INTRAVENOUS | Status: AC
  Administered 2022-04-08: 2 g via INTRAVENOUS
  Filled 2022-04-08: qty 50

## 2022-04-08 MED ORDER — LIDOCAINE 5 % EX PTCH
1.0000 | MEDICATED_PATCH | CUTANEOUS | Status: DC
  Administered 2022-04-08 – 2022-04-26 (×6): 1 via TRANSDERMAL
  Filled 2022-04-08 (×7): qty 1

## 2022-04-08 MED ORDER — MAGNESIUM SULFATE 4 GM/100ML IV SOLN: 4.0000 g | Freq: Once | INTRAVENOUS | Status: DC

## 2022-04-08 NOTE — Progress Notes (Signed)
Remote pacemaker transmission.   

## 2022-04-08 NOTE — Progress Notes (Signed)
  Subjective:  Patient ID: [name], female    DOB: 02/24/1936,  MRN: [id number]  A 86 y.o. female presents with left submetatarsal 5 ulceration with possible concern for underlying osteo-.  She still is refusing to undergo MRI or amputation at this time.  No nausea fever chills vomiting.  Objective:   Vitals:   04/08/22 0611 04/08/22 0612  BP:    Pulse:    Resp:    Temp:    SpO2: 97% 97%   General AA&O x3. Normal mood and affect.  Vascular Dorsalis pedis and posterior tibial pulses 2/4 bilat. Brisk capillary refill to all digits. Pedal hair present.  Neurologic Epicritic sensation grossly intact.  Dermatologic Left lateral submetatarsal 5 ulceration probing down to deep tissue/bone.  No purulent drainage noted no malodor present.  Mild erythema noted  Orthopedic: MMT 5/5 in dorsiflexion, plantarflexion, inversion, and eversion. Normal joint ROM without pain or crepitus.   IMPRESSION: 1. Soft tissue wound overlying the lateral aspect of the fifth MTP joint. Cortical irregularity of the fifth metatarsal head concerning for osteomyelitis. 2. Diffuse soft tissue edema of the left foot and ankle which may be secondary to fluid overload versus cellulitis.    Assessment & Plan:  Patient was evaluated and treated and all questions answered.  Left submetatarsal 5 ulceration with possible underlying osteomyelitis -All questions and concerns were discussed with the patient in extensive detail -Patient is still currently refusing MRI as well as amputation therefore I believe she will benefit from long-term IV antibiotics for management.   -Continue antibiotics per infectious disease -ABIs were reviewed which shows good flow to the left lower extremity back in September -She is a high risk of losing digit versus the foot versus the leg.  I discussed this with the patient -Local wound care with Betadine wet-to-dry dressing -Patient will follow-up with the wound care center 1 week  or so from discharge -Podiatry to sign off.  Please reconsult [place] as needed.  Felipa Furnace, DPM  Accessible via secure chat for questions or concerns.

## 2022-04-08 NOTE — Consult Note (Signed)
Consultation Note Date: 04/08/2022   Patient Name: [name]  DOB: [birth year]  MRN: [id number]  Age / Sex: 86 y.o., female  PCP: Wardell Honour, MD Referring Physician: Kerney Elbe, DO  Reason for Consultation: Establishing goals of care  HPI/Patient Profile: 86 y.o. female  admitted on 04/04/2022   Clinical Assessment and Goals of Care: D89-year-old lady, known to palliative medicine service, seen in a previous hospitalization.  Patient currently lives in an apartment with her 2 cats.  Patient has underlying history of diabetes COPD fatty liver disease hypertension dyslipidemia complete heart block status post pacemaker.  Patient used to work as a Primary school teacher.   Patient is currently admitted to hospital medicine service with infectious disease as well as podiatry specialists consulted.  Patient has left submetatarsal ulceration with possible underlying osteomyelitis.  She is on antibiotics. She has disease has also been following, patient has acute cystitis without hematuria, also found to have polymicrobial bacteremia E faecalis gram-positive cocci in clusters.  Patient states that she had a fall and is found to have T12 osteoporotic compression fracture with some associated pain.  Curbside neurosurgical input was also sought.  Palliative medicine team consulted for ongoing goals of care discussions, see discussion below.  OTHER Has 2 friends  SUMMARY OF RECOMMENDATIONS   I met with the patient this morning.  Goals of care discussions undertaken.   Palliative medicine is specialized medical care for people living with serious illness. It focuses on providing relief from the symptoms and stress of a serious illness. The goal is to improve quality of life for both the patient and the family. Goals of care: Broad aims of medical therapy in relation to the patient's values and  preferences. Our aim is to provide medical care aimed at enabling patients to achieve the goals that matter most to them, given the circumstances of their particular medical situation and their constraints.   Goals wishes and values attempted to be explored.  Pulled up patient's images noted on podiatry's documentation and discussed with the patient.  I explained to her to the best of my ability about concern for osteomyelitis and the recommendation for MRI.  Also discussed about scope of current hospitalization, bacteremia, antibiotics.  Patient states that she does not want to undergo an MRI, she does not want to consider amputation.  She is okay with continuing antibiotics for now.  We discussed about DNR/DNI and patient wishes to continue the same.  I will add lidocaine patch for T12 compression fracture associated mid back discomfort that the patient complains about episodically.  She does not want her friends contacted.  I recommend palliative services following along once the patient is stable enough to be discharged back to her apartment.  Code Status/Advance Care Planning: DNR   Symptom Management:  Continue current mode of care  Palliative Prophylaxis:  Delirium Protocol   Psycho-social/Spiritual:  Desire for further Chaplaincy support:yes Additional Recommendations: Caregiving  Support/Resources  Prognosis:  < 12 months  Discharge Planning:  Home with  palliative    Primary Diagnoses: Present on Admission:  Acute cystitis without hematuria  Pacemaker  Uncontrolled type 2 diabetes mellitus with hyperglycemia (HCC)  Protein-calorie malnutrition, severe  Sepsis without acute organ dysfunction ([place])  Acute metabolic encephalopathy  Chronic respiratory failure with hypoxia (HCC) - home O2 of 2-3 L/min   I have reviewed the medical record, interviewed the patient and family, and examined the patient. The following aspects are pertinent.  Past Medical History:  Diagnosis  Date   Acute ischemic stroke ([place]) 08/10/2021   Anxiety disorder, unspecified    Arthritis    Benign paroxysmal positional vertigo 08/09/2012   Chronic rhinitis    Cognitive communication deficit    Constipation, unspecified    COPD mixed type ([place])    on home O2   Diverticulosis    DKA (diabetic ketoacidosis) ([place]) 10/02/2021   Esophageal stricture    Essential (primary) hypertension    Gastro-esophageal reflux disease without esophagitis    Glaucoma    Hiatal hernia    Hyperlipidemia, unspecified    Irritable bowel syndrome    Ischemic stroke ([place]) 08/09/2021   Osteomyelitis of fifth toe of left foot ([place]) 03/14/2022   Presence of permanent cardiac pacemaker 01/14/2020   Spinal stenosis, lumbar region, without neurogenic claudication    Steatohepatitis    Thrombocytopenia, unspecified (HCC)    Type 2 diabetes mellitus with diabetic polyneuropathy ([place])    Social History   Socioeconomic History   Marital status: Single    Spouse name: John,divorced, deceased   Number of children: 0   Years of education: high school   Highest education level: High school graduate  Occupational History   Occupation: Retired     Comment: Training and development officer  Tobacco Use   Smoking status: Former    Types: Cigarettes    Quit date: 05/08/1988    Years since quitting: 33.9   Smokeless tobacco: Never  Vaping Use   Vaping Use: Never used  Substance and Sexual Activity   Alcohol use: No    Alcohol/week: 0.0 standard drinks of alcohol   Drug use: No   Sexual activity: Not Currently  Other Topics Concern   Not on file  Social History Narrative   Not on file   Social Determinants of Health   Financial Resource Strain: Low Risk  (10/24/2021)   Overall Financial Resource Strain (CARDIA)    Difficulty of Paying Living Expenses: Not hard at all  Food Insecurity: No Food Insecurity (01/16/2022)   Hunger Vital Sign    Worried About Running Out of Food in the Last Year: Never true    Ran Out of Food in  the Last Year: Never true  Transportation Needs: No Transportation Needs (01/16/2022)   PRAPARE - Hydrologist (Medical): No    Lack of Transportation (Non-Medical): No  Recent Concern: Transportation Needs - Unmet Transportation Needs (10/24/2021)   PRAPARE - Transportation    Lack of Transportation (Medical): Yes    Lack of Transportation (Non-Medical): Yes  Physical Activity: Not on file  Stress: No Stress Concern Present (04/28/2020)   [place]    Feeling of Stress : Only a little  Social Connections: Moderately Isolated (02/17/2020)   Social Connection and Isolation Panel [NHANES]    Frequency of Communication with Friends and Family: More than three times a week    Frequency of Social Gatherings with Friends and Family: More than three times a week  Attends Religious Services: More than 4 times per year    Active Member of Clubs or Organizations: No    Attends Archivist Meetings: Patient refused    Marital Status: Divorced   Family History  Problem Relation Age of Onset   Heart disease Father    Lung cancer Mother        small cell;Byssinosis   Lung cancer Sister    Liver cancer Sister        ? mets from another area of the body   Diabetes Other        grandmother   Stroke Maternal Grandfather    Scheduled Meds:  budesonide (PULMICORT) nebulizer solution  0.25 mg Nebulization BID   Chlorhexidine Gluconate Cloth  6 each Topical Daily   feeding supplement (GLUCERNA SHAKE)  237 mL Oral BID BM   heparin  5,000 Units Subcutaneous Q8H   insulin aspart  0-9 Units Subcutaneous Q4H   insulin glargine-yfgn  10 Units Subcutaneous QHS   ipratropium-albuterol  3 mL Nebulization BID   leptospermum manuka honey  1 Application Topical Daily   Ensure Max Protein  11 oz Oral Daily   tamsulosin  0.4 mg Oral QPC supper   Continuous Infusions:  ampicillin-sulbactam (UNASYN) IV  Stopped (04/08/22 0600)   magnesium sulfate bolus IVPB     PRN Meds:.acetaminophen, ondansetron (ZOFRAN) IV Medications Prior to Admission:  Prior to Admission medications   Medication Sig Start Date End Date Taking? Authorizing Provider  acetaminophen (TYLENOL) 325 MG tablet Take 2 tablets (650 mg total) by mouth every 6 (six) hours as needed for mild pain (or Fever >/= 101). 01/23/20  Yes Domenic Polite, MD  albuterol (VENTOLIN HFA) 108 (90 Base) MCG/ACT inhaler Inhale 1-2 puffs into the lungs every 6 (six) hours as needed for wheezing or shortness of breath. 12/26/21  Yes Young, Clinton D, MD  ALPRAZolam Duanne Moron) 0.5 MG tablet Take 0.25 mg by mouth daily as needed for anxiety.   Yes [provider]  apixaban (ELIQUIS) 5 MG TABS tablet Take 1 tablet (5 mg total) by mouth 2 (two) times daily. 02/13/22  Yes Regalado, Belkys A, MD  cefadroxil (DURICEF) 500 MG capsule Take 2 capsules (1,000 mg total) by mouth 2 (two) times daily. 03/14/22  Yes Tommy Medal, Lavell Islam, MD  doxycycline (VIBRA-TABS) 100 MG tablet Take 1 tablet (100 mg total) by mouth 2 (two) times daily. 03/14/22  Yes Tommy Medal, Lavell Islam, MD  feeding supplement (ENSURE ENLIVE / ENSURE PLUS) LIQD Take 237 mLs by mouth 2 (two) times daily between meals. 12/12/21  Yes Mariel Aloe, MD  fluorometholone (FML) 0.1 % ophthalmic suspension Place 1 drop into both eyes 3 (three) times daily. 12/01/21  Yes [provider]  furosemide (LASIX) 20 MG tablet Take 20 mg by mouth daily.   Yes [provider]  insulin aspart (NOVOLOG FLEXPEN) 100 UNIT/ML FlexPen Inject 5 Units into the skin 3 (three) times daily with meals. 11/13/21  Yes Linus Galas, MD  insulin degludec (TRESIBA FLEXTOUCH) 200 UNIT/ML FlexTouch Pen Inject 26 Units into the skin at bedtime. 12/12/21  Yes Mariel Aloe, MD  ipratropium-albuterol (DUONEB) 0.5-2.5 (3) MG/3ML SOLN Use 1 vial in nebulizer every 8 hours and as needed 01/13/22  Yes Regalado, Belkys A,  MD  levalbuterol (XOPENEX HFA) 45 MCG/ACT inhaler Inhale 1-2 puffs into the lungs every 6 (six) hours as needed for wheezing. 12/30/21  Yes Baird Lyons D, MD  metoprolol succinate (TOPROL-XL) 50 MG  24 hr tablet TAKE ONE TABLET TWICE A DAY WITH OR IMMEDIATELY FOLLOWING A MEAL. Patient taking differently: Take 50 mg by mouth 2 (two) times daily. 11/02/21  Yes Wardell Honour, MD  Multiple Vitamin (MULTIVITAMIN WITH MINERALS) TABS tablet Take 1 tablet by mouth daily. 08/14/21  Yes Pokhrel, Laxman, MD  pantoprazole (PROTONIX) 40 MG tablet Take 1 tablet (40 mg total) by mouth daily. 08/13/21 08/13/22 Yes Pokhrel, Laxman, MD  pravastatin (PRAVACHOL) 20 MG tablet Take 1 tablet (20 mg total) by mouth daily. 02/28/21  Yes Wardell Honour, MD  thiamine 100 MG tablet Take 1 tablet (100 mg total) by mouth daily. 08/14/21  Yes Pokhrel, Laxman, MD  Tiotropium Bromide-Olodaterol (STIOLTO RESPIMAT) 2.5-2.5 MCG/ACT AERS Inhale 2 puffs into the lungs daily. 10/20/21  Yes Deneise Lever, MD   Allergies  Allergen Reactions   Chlordiazepoxide-Clidinium Other (See Comments)    Sleepy,weak   Biaxin [Clarithromycin] Other (See Comments)    Foul taste, abd pain, diarrhea   Tape Other (See Comments)    SKIN IS VERY THIN AND WILL TEAR AND BRUISE EASILY!!   Ultram [Tramadol] Other (See Comments)    Caused tremors   Codeine Other (See Comments)    Upset the stomach   Review of Systems Complains of mid back pain sometimes Physical Exam Awake alert oriented Resting in bed Patient has left lateral submetatarsal ulceration,  pictures noted on podiatry's notes  Regular work of breathing S1-S2  Vital Signs: BP 132/63 (BP Location: Right Arm)   Pulse 97   Temp 97.7 F (36.5 C) (Oral)   Resp 16   Ht _0  (1.6 m)   Wt 47.5 kg   SpO2 97%   BMI 18.55 kg/m  Pain Scale: 0-10   Pain Score: 0-No pain   SpO2: SpO2: 97 % O2 Device:SpO2: 97 % O2 Flow Rate: .   IO: Intake/output summary:  Intake/Output Summary  (Last 24 hours) at 04/08/2022 1122 Last data filed at 04/08/2022 0900 Gross per 24 hour  Intake 1707.23 ml  Output 1600 ml  Net 107.23 ml    LBM: Last BM Date : 04/06/22 Baseline Weight: Weight: 54 kg Most recent weight: Weight: 47.5 kg     Palliative Assessment/Data:   PPS 50%  Time In:  10 Time Out:  11 Time Total:  60  Greater than 50%  of this time was spent counseling and coordinating care related to the above assessment and plan.  Signed by: Loistine Chance, MD   Please contact Palliative Medicine Team phone at [phone] for questions and concerns.  For individual provider: See Shea Evans

## 2022-04-08 NOTE — Progress Notes (Signed)
Informed doctor that Patient abdomen is distended and hasn't had a bowel movement in a few days; may need a laxative. Doctor ordered to bladder scan and will put in bowel regimen

## 2022-04-08 NOTE — TOC Progression Note (Signed)
Transition of Care [facility]) - Progression Note    Patient Details  Name: [name] MRN: [id number] Date of Birth: 1936/04/26  Transition of Care [facility]) CM/SW Contact  Henrietta Dine, RN Phone Number: 04/08/2022, 3:07 PM  Clinical Narrative:    Pt from home w/ caregiver; ?osteomyelitis podiatry consult; PC following-pt does not want MRI or amputation, pt agreed to abx; ?SNF; awaiting PT eval.         Expected Discharge Plan and Services                                                 Social Determinants of Health (SDOH) Interventions    Readmission Risk Interventions     No data to display

## 2022-04-08 NOTE — Progress Notes (Signed)
PROGRESS NOTE    [name]  KYH:[id number] DOB: 13-May-1935 DOA: 04/04/2022 PCP: Wardell Honour, MD   Brief Narrative:  The patient is a 86 year old female with history of diabetes mellitus type 2, complete heart block status post pacemaker, chronic hypoxic respiratory failure on home oxygen at 3 L/min, COPD, unspecified CVA, hypertension, hyperlipidemia, thrombocytopenia, chronic wounds including left metatarsal head osteomyelitis followed by Dr. Drucilla Schmidt on doxycycline and cefadroxil presented with altered mental status. On presentation, she was hypoglycemic and found to have UTI. She was started on IV fluids and broad-spectrum antibiotics. She was found to have polymicrobial bacteremia: ID was consulted; antibiotics switched to Unasyn. She was also found to have T12 vertebral fracture.   Assessment and Plan:  Polymicrobial Bacteremia Sepsis: Present on Admisison -Blood cultures on admission positive for Enterococcus faecalis and Rothia. questionable cause.   -ID following.  Repeat blood cultures from 04/06/2022 are negative so far.  Currently on Unasyn as per ID. -2D echo was poor study which showed EF of 45 to 50% but no obvious vegetation. If the patient has persistent bacteremia, might need TEE   UTI/acute cystitis without hematuria -Urine cultures negative so far.  Antibiotics as above   Acute metabolic encephalopathy -Possibly from above.  Mental status improving.   -Still slow to respond and slightly confused to time.  Fall precautions.  Monitor mental status.  PT eval.   Acute urinary retention -Currently has a Foley catheter.  Start Flomax.   -Will give voiding trial in the next few days -Flush foley    T12 vertebral fracture -Cannot have the MRI because of pacemaker.   -CT shows acute T12 vertebral compression fracture. -Neurosurgery consulted as per ID recommendations.  Surgery feels that she has a T12 osteoporotic compression fracture with associated pain with no  retropulsion or canal stenosis.  There is nothing to suggest osteomyelitis or discitis and they are recommending her be treated with bracing for pain control and given her osteomyelitis and bacteremia and history of sacral decubitus ulcer she is not a candidate for any type of intervention -PT eval.   Chronic right lateral foot osteomyelitis Left lateral foot callus -Patient has received more than 7 weeks of oral doxycycline and cefadroxil. -Dr. Starla Link had consulted podiatry as per ID recommendations to see if the left lateral foot callus needs any further investigation or treatment. -Infectious diseases feels that she needs no further antibiotics for the right foot given that she is getting 7+ weeks of doxycycline and then subsequently cefadroxil since 03/14/2020 -The podiatry team has evaluated her and patient continues to refuse her MRI as well as amputation and therefore they believe that she would benefit from long-term IV antibiotic management and remains on IV Unasyn -Podiatry feels that she is at high risk of losing her digit versus her foot versus the leg and they are recommending continuing local wound care with Betadine wet-to-dry dressings   Leukocytosis -Resolved and WBC is 6.6 -Continue to Monitor and Trend -Repeat CBC in the AM    Hyponatremia -Mild and improved as sodium has gone from 134 is now 136 -Continue monitor and trend and repeat CMP in a.m.  Hypomagnesemia -Replete with IV mag sulfate 4 g total -Continue to monitor and trend and repeat CMP in a.m.   Severe protein calorie malnutrition -Nutrition Status: Nutrition Problem: Severe Malnutrition Etiology: chronic illness Signs/Symptoms: severe fat depletion, severe muscle depletion, percent weight loss Percent weight loss: 31 % (in 1 year) Interventions: Refer to RD note for  recommendations, Premier Protein, Glucerna shake  Pressure Ulcers -Various wounds: I have personally evaluated and agree with the  documentation of the wounds as below Pressure Injury 01/11/22 Sacrum Mid;Medial Stage 1 -  Intact skin with non-blanchable redness of a localized area usually over a bony prominence. (Active)  01/11/22 0226  Location: Sacrum  Location Orientation: Mid;Medial  Staging: Stage 1 -  Intact skin with non-blanchable redness of a localized area usually over a bony prominence.  Wound Description (Comments):   Present on Admission: Yes     Pressure Injury 04/05/22 Hip Anterior;Left;Proximal Stage 2 -  Partial thickness loss of dermis presenting as a shallow open injury with a red, pink wound bed without slough. Round 1cmx1cm pressure sore, yellow center with pink edges, dry wound bed (Active)  04/05/22 0000  Location: Hip  Location Orientation: Anterior;Left;Proximal  Staging: Stage 2 -  Partial thickness loss of dermis presenting as a shallow open injury with a red, pink wound bed without slough.  Wound Description (Comments): Round 1cmx1cm pressure sore, yellow center with pink edges, dry wound bed  Present on Admission: Yes     Pressure Injury 04/05/22 Toe (Comment  which one) Left;Lateral Unstageable - Full thickness tissue loss in which the base of the injury is covered by slough (yellow, tan, gray, green or brown) and/or eschar (tan, brown or black) in the wound bed. 2cmx1cm (Active)  04/05/22 0000  Location: Toe (Comment  which one) (outer aspect of pinky toe)  Location Orientation: Left;Lateral  Staging: Unstageable - Full thickness tissue loss in which the base of the injury is covered by slough (yellow, tan, gray, green or brown) and/or eschar (tan, brown or black) in the wound bed.  Wound Description (Comments): 2cmx1cm oval shaped wound, covered with grey/brown scabbing which has hair/fuzz? imbedded in it, dry with no drainage  Present on Admission: Yes  -C/w WOC Consult   History of complete heart block status post pacemaker -Outpatient follow-up with cardiology/EP   COPD Chronic  respiratory failure with hypoxia -Currently stable -SpO2: 92 % -Continue current nebs   Diabetes mellitus type II with Hyperglycemia -Continue CBGs with SSI.  A1c 12.9 -Increase Semglee to 12 units nightly and added 3 units of NovoLog 3 times daily with meals and she needs further blood pressure control and continuing NovoLog 0 to 9 units every 4h   Hypoalbuminemia -The patient's albumin level is now 2.2 -Cpntinue to monitor and trend and repeat CMP in a.m.  Abdominal pain with constipation -Has not had a bowel movement in several days -Check KUB and showed "Nonspecific bowel gas pattern. Moderate colonic stool burden, most prominent in the hepatic flexure." -Restart stool softeners with senna docusate 1 tab p.o. twice daily, MiraLAX 17 g p.o. twice daily, as well as bisacodyl 10 g rectal suppository as needed  Goals of care -Discussed with patient who is more awake and responsive and agrees for DNR.   -Consulted palliative care for goals of care discussion.  DVT prophylaxis: heparin injection 5,000 Units Start: 04/05/22 0600 SCDs Start: 04/05/22 0018    Code Status: DNR Family Communication: No family currently at bedside  Disposition Plan:  Level of care: Med-Surg Status is: Inpatient Remains inpatient appropriate because: Further specialist clearance prior to safe discharge disposition   Consultants:  Podiatry Palliative Care Medicine ID Neurosurgery  Procedures:  As delineated as Above   Antimicrobials:  Anti-infectives (From admission, onward)    Start     Dose/Rate Route Frequency Ordered Stop   04/05/22 1700  Ampicillin-Sulbactam (UNASYN) 3 g in sodium chloride 0.9 % 100 mL IVPB        3 g 200 mL/hr over 30 Minutes Intravenous Every 6 hours 04/05/22 1643     04/05/22 0800  cefTRIAXone (ROCEPHIN) 1 g in sodium chloride 0.9 % 100 mL IVPB  Status:  Discontinued        1 g 200 mL/hr over 30 Minutes Intravenous Every 24 hours 04/05/22 0017 04/05/22 1643    04/04/22 2145  cefTRIAXone (ROCEPHIN) 1 g in sodium chloride 0.9 % 100 mL IVPB        1 g 200 mL/hr over 30 Minutes Intravenous  Once 04/04/22 2135 04/04/22 2240       Subjective: Seen and examined at bedside doing okay but then subsequently started having some abdominal discomfort after I left.  States that she does not want to go through an MRI.  Feels okay.  Willing to try antibiotics.  No other concerns or close at this time.  Objective: Vitals:   04/08/22 0438 04/08/22 0611 04/08/22 0612 04/08/22 1412  BP: 132/63   107/69  Pulse: 97   (!) 114  Resp: 16   18  Temp: 97.7 F (36.5 C)   98.3 F (36.8 C)  TempSrc: Oral   Oral  SpO2: 94% 97% 97% 92%  Weight:      Height:        Intake/Output Summary (Last 24 hours) at 04/08/2022 1813 Last data filed at 04/08/2022 1504 Gross per 24 hour  Intake 1707.23 ml  Output 3300 ml  Net -1592.77 ml   Filed Weights   04/04/22 1026 04/05/22 0034 04/05/22 0415  Weight: 54 kg 47.5 kg 47.5 kg   Examination: Physical Exam:  Constitutional: Thin frail elderly Caucasian female who is chronically ill-appearing in no acute distress but complaining of some pain  Respiratory: Diminished to auscultation bilaterally, no wheezing, rales, rhonchi or crackles. Normal respiratory effort and patient is not tachypenic. No accessory muscle use.  Unlabored breathing Cardiovascular: RRR, no murmurs / rubs / gallops. S1 and S2 auscultated.  Abdomen: Soft, non-tender, has some abdominal distention. Bowel sounds positive.  GU: Deferred. Musculoskeletal: No clubbing / cyanosis of digits/nails. No joint deformity upper and lower extremities Skin: Skin lesions and ulcers noted Neurologic: CN 2-12 grossly intact with no focal deficits. Romberg sign and cerebellar reflexes not assessed.  Psychiatric: Normal judgment and insight. Alert and oriented x 3. Normal mood and appropriate affect.   Data Reviewed: I have personally reviewed following labs and imaging  studies  CBC: Recent Labs  Lab 04/04/22 1140 04/05/22 0040 04/06/22 0402 04/07/22 0358 04/08/22 0403  WBC 18.8* 11.8* 6.8 7.7 6.6  NEUTROABS  --  9.4*  --   --  4.1  HGB 15.1* 14.2 12.9 13.0 13.5  HCT 47.0* 46.4* 41.2 41.9 42.3  MCV 86.1 88.0 86.6 86.4 86.5  PLT 369 246 197 194 235   Basic Metabolic Panel: Recent Labs  Lab 04/04/22 1140 04/05/22 0040 04/06/22 0402 04/07/22 0358 04/08/22 0403  NA 133* 135 135 134* 136  K 5.1 4.7 3.9 3.9 4.0  CL 96* 98 98 96* 94*  CO2 25 25 28  32 35*  GLUCOSE 438* 391* 211* 166* 174*  BUN 31* 31* 16 16 18   CREATININE 0.72 0.85 0.62 0.69 0.70  CALCIUM 9.4 9.0 8.5* 8.5* 9.0  MG  --  1.8  --   --  1.4*   GFR: Estimated Creatinine Clearance: 39.3 mL/min (by C-G formula based on  SCr of 0.7 mg/dL). Liver Function Tests: Recent Labs  Lab 04/04/22 1816 04/05/22 0040 04/08/22 0403  AST 11* 9* 13*  ALT 7 9 9   ALKPHOS 59 63 55  BILITOT 0.8 0.8 0.2*  PROT 6.3* 6.9 6.1*  ALBUMIN 2.2* 2.5* 2.2*   Recent Labs  Lab 04/04/22 1816  LIPASE 25   No results for input(s): "AMMONIA" in the last 168 hours. Coagulation Profile: No results for input(s): "INR", "PROTIME" in the last 168 hours. Cardiac Enzymes: No results for input(s): "CKTOTAL", "CKMB", "CKMBINDEX", "TROPONINI" in the last 168 hours. BNP (last 3 results) No results for input(s): "PROBNP" in the last 8760 hours. HbA1C: No results for input(s): "HGBA1C" in the last 72 hours. CBG: Recent Labs  Lab 04/08/22 0005 04/08/22 0436 04/08/22 0815 04/08/22 1219 04/08/22 1602  GLUCAP 204* 220* 160* 319* 286*   Lipid Profile: No results for input(s): "CHOL", "HDL", "LDLCALC", "TRIG", "CHOLHDL", "LDLDIRECT" in the last 72 hours. Thyroid Function Tests: No results for input(s): "TSH", "T4TOTAL", "FREET4", "T3FREE", "THYROIDAB" in the last 72 hours. Anemia Panel: No results for input(s): "VITAMINB12", "FOLATE", "FERRITIN", "TIBC", "IRON", "RETICCTPCT" in the last 72 hours. Sepsis  Labs: Recent Labs  Lab 04/04/22 1816 04/05/22 0040  LATICACIDVEN 2.0* 1.9    Recent Results (from the past 240 hour(s))  Urine Culture     Status: None   Collection Time: 04/04/22  1:36 PM   Specimen: Urine, Clean Catch  Result Value Ref Range Status   Specimen Description   Final    URINE, CLEAN CATCH Performed at [facility], [place] [address] [place], [place] [address]    Special Requests   Final    NONE Performed at [facility], [place] [address], [place], [place] [address]    Culture   Final    NO GROWTH Performed at [facility], [place] [address] [place], [place] [address]    Report Status 04/05/2022 FINAL  Final  Culture, blood (single)     Status: Abnormal   Collection Time: 04/04/22  6:16 PM   Specimen: BLOOD  Result Value Ref Range Status   Specimen Description   Final    BLOOD SITE NOT SPECIFIED Performed at [facility] [address], [place], [place] [address]    Special Requests   Final    BOTTLES DRAWN AEROBIC AND ANAEROBIC Blood Culture results may not be optimal due to an inadequate volume of blood received in culture bottles Performed at [facility] [address], [place], [place] [address]    Culture  Setup Time   Final    GRAM POSITIVE COCCI IN CHAINS ANAEROBIC BOTTLE ONLY CRITICAL RESULT CALLED TO, READ BACK BY AND VERIFIED WITH: PHARMD C. SHADE [id number] @1607  FH GRAM POSITIVE COCCI IN CLUSTERS BOTTLES DRAWN AEROBIC ONLY CRITICAL RESULT CALLED TO, READ BACK BY AND VERIFIED WITH: PHARMD Balfour 04/06/22 @ 0707 BY AB    Culture (A)  Final    ENTEROCOCCUS FAECALIS ROTHIA MUCILAGINOSA Standardized susceptibility testing for this organism is not available. Performed at [facility], [place] [address], [place], [place] [address]    Report Status 04/07/2022 FINAL  Final   Organism ID, Bacteria ENTEROCOCCUS FAECALIS  Final      Susceptibility   Enterococcus faecalis  - MIC*    AMPICILLIN <=2 SENSITIVE Sensitive     VANCOMYCIN 2 SENSITIVE Sensitive     GENTAMICIN SYNERGY SENSITIVE Sensitive     * ENTEROCOCCUS FAECALIS  Blood Culture ID Panel (Reflexed)     Status: Abnormal   Collection Time: 04/04/22  6:16 PM  Result Value Ref Range Status   Enterococcus faecalis DETECTED (A) NOT DETECTED Final    Comment: CRITICAL RESULT CALLED TO, READ BACK BY AND VERIFIED WITH: PHARMD C. SHADE [id number] @1607  FH    Enterococcus Faecium NOT DETECTED NOT DETECTED Final   Listeria monocytogenes NOT DETECTED NOT DETECTED Final   Staphylococcus species NOT DETECTED NOT DETECTED Final   Staphylococcus aureus (BCID) NOT DETECTED NOT DETECTED Final   Staphylococcus epidermidis NOT DETECTED NOT DETECTED Final   Staphylococcus lugdunensis NOT DETECTED NOT DETECTED Final   Streptococcus species DETECTED (A) NOT DETECTED Final    Comment: Not Enterococcus species, Streptococcus agalactiae, Streptococcus pyogenes, or Streptococcus pneumoniae. CRITICAL RESULT CALLED TO, READ BACK BY AND VERIFIED WITH: PHARMD C. SHADE [id number] @1607  FH    Streptococcus agalactiae NOT DETECTED NOT DETECTED Final   Streptococcus pneumoniae NOT DETECTED NOT DETECTED Final   Streptococcus pyogenes NOT DETECTED NOT DETECTED Final   A.calcoaceticus-baumannii NOT DETECTED NOT DETECTED Final   Bacteroides fragilis NOT DETECTED NOT DETECTED Final   Enterobacterales NOT DETECTED NOT DETECTED Final   Enterobacter cloacae complex NOT DETECTED NOT DETECTED Final   Escherichia coli NOT DETECTED NOT DETECTED Final   Klebsiella aerogenes NOT DETECTED NOT DETECTED Final   Klebsiella oxytoca NOT DETECTED NOT DETECTED Final   Klebsiella pneumoniae NOT DETECTED NOT DETECTED Final   Proteus species NOT DETECTED NOT DETECTED Final   Salmonella species NOT DETECTED NOT DETECTED Final   Serratia marcescens NOT DETECTED NOT DETECTED Final   Haemophilus influenzae NOT DETECTED NOT DETECTED Final   Neisseria  meningitidis NOT DETECTED NOT DETECTED Final   Pseudomonas aeruginosa NOT DETECTED NOT DETECTED Final   Stenotrophomonas maltophilia NOT DETECTED NOT DETECTED Final   Candida albicans NOT DETECTED NOT DETECTED Final   Candida auris NOT DETECTED NOT DETECTED Final   Candida glabrata NOT DETECTED NOT DETECTED Final   Candida krusei NOT DETECTED NOT DETECTED Final   Candida parapsilosis NOT DETECTED NOT DETECTED Final   Candida tropicalis NOT DETECTED NOT DETECTED Final   Cryptococcus neoformans/gattii NOT DETECTED NOT DETECTED Final   Vancomycin resistance NOT DETECTED NOT DETECTED Final    Comment: Performed at [facility], [address] [place], [place] [address]  Blood culture (routine x 2)     Status: None (Preliminary result)   Collection Time: 04/04/22  9:36 PM   Specimen: BLOOD  Result Value Ref Range Status   Specimen Description   Final    BLOOD RIGHT ANTECUBITAL Performed at [facility] [address] [place], [place] [address]    Special Requests   Final    BOTTLES DRAWN AEROBIC ONLY Blood Culture results may not be optimal due to an inadequate volume of blood received in culture bottles Performed at [facility] [address], [place], [place] [address]    Culture   Final    NO GROWTH 3 DAYS Performed at [facility], [place] [address] [place], [place] [address]    Report Status PENDING  Incomplete  Blood culture (routine x 2)     Status: None (Preliminary result)   Collection Time: 04/04/22  9:41 PM   Specimen: BLOOD  Result Value Ref Range Status   Specimen Description   Final    BLOOD LEFT ANTECUBITAL Performed at [facility] [address] [place], [place] [address]  Special Requests   Final    BOTTLES DRAWN AEROBIC ONLY Blood Culture results may not be optimal due to an inadequate volume of blood received in culture bottles Performed at [facility] [address], [place], [place] [address]    Culture   Final    NO GROWTH 3 DAYS Performed at [facility], [place] [address] [place], [place] [address]    Report Status PENDING  Incomplete  Blood Culture ID Panel (Reflexed)     Status: None   Collection Time: 04/06/22  5:43 AM  Result Value Ref Range Status   Enterococcus faecalis NOT DETECTED NOT DETECTED Final   Enterococcus Faecium NOT DETECTED NOT DETECTED Final   Listeria monocytogenes NOT DETECTED NOT DETECTED Final   Staphylococcus species NOT DETECTED NOT DETECTED Final   Staphylococcus aureus (BCID) NOT DETECTED NOT DETECTED Final   Staphylococcus epidermidis NOT DETECTED NOT DETECTED Final   Staphylococcus lugdunensis NOT DETECTED NOT DETECTED Final   Streptococcus species NOT DETECTED NOT DETECTED Final   Streptococcus agalactiae NOT DETECTED NOT DETECTED Final   Streptococcus pneumoniae NOT DETECTED NOT DETECTED Final   Streptococcus pyogenes NOT DETECTED NOT DETECTED Final   A.calcoaceticus-baumannii NOT DETECTED NOT DETECTED Final   Bacteroides fragilis NOT DETECTED NOT DETECTED Final   Enterobacterales NOT DETECTED NOT DETECTED Final   Enterobacter cloacae complex NOT DETECTED NOT DETECTED Final   Escherichia coli NOT DETECTED NOT DETECTED Final   Klebsiella aerogenes NOT DETECTED NOT DETECTED Final   Klebsiella oxytoca NOT DETECTED NOT DETECTED Final   Klebsiella pneumoniae NOT DETECTED NOT DETECTED Final   Proteus species NOT DETECTED NOT DETECTED Final   Salmonella species NOT DETECTED NOT DETECTED Final   Serratia marcescens NOT DETECTED NOT DETECTED Final   Haemophilus influenzae NOT DETECTED NOT DETECTED Final   Neisseria meningitidis NOT DETECTED NOT DETECTED Final   Pseudomonas aeruginosa NOT DETECTED NOT DETECTED Final   Stenotrophomonas maltophilia NOT DETECTED NOT DETECTED Final   Candida albicans NOT DETECTED NOT DETECTED Final   Candida auris NOT DETECTED NOT DETECTED Final   Candida glabrata NOT DETECTED NOT  DETECTED Final   Candida krusei NOT DETECTED NOT DETECTED Final   Candida parapsilosis NOT DETECTED NOT DETECTED Final   Candida tropicalis NOT DETECTED NOT DETECTED Final   Cryptococcus neoformans/gattii NOT DETECTED NOT DETECTED Final    Comment: Performed at [facility], [place] [address], [place], [place] [address]  Culture, blood (Routine X 2) w Reflex to ID Panel     Status: None (Preliminary result)   Collection Time: 04/06/22 12:02 PM   Specimen: BLOOD LEFT HAND  Result Value Ref Range Status   Specimen Description   Final    BLOOD LEFT HAND Performed at [facility] [address] [place], [place] [address]    Special Requests   Final    IN PEDIATRIC BOTTLE Blood Culture adequate volume Performed at [facility] [address] [place], [place] [address]    Culture   Final    NO GROWTH 2 DAYS Performed at [facility] [address], [place], [place] [address]    Report Status PENDING  Incomplete  Culture, blood (Routine X 2) w Reflex to ID Panel     Status: None (Preliminary result)   Collection Time: 04/06/22 12:02 PM   Specimen: BLOOD LEFT ARM  Result Value Ref Range Status   Specimen Description   Final    BLOOD LEFT  ARM Performed at [facility], [place] [address], [place], [place] [address]    Special Requests   Final    IN PEDIATRIC BOTTLE Blood Culture adequate volume Performed at [facility] [address] [place], [place] [address]    Culture   Final    NO GROWTH 2 DAYS Performed at [facility] [address] [place], [place] [address]    Report Status PENDING  Incomplete    Radiology Studies: DG Abd 1 View  Result Date: 04/08/2022 CLINICAL DATA:  86 year old female with abdominal distension. EXAM: ABDOMEN - 1 VIEW COMPARISON:  04/04/2022 FINDINGS: Mild diffuse gaseous distension of the visualized small bowel. Large volume stool burden about the hepatic  flexure. Colonic gas is visualized distally. Surgical clips in the right upper quadrant. Similar appearing dextroconvex curvature of the lumbar spine. IMPRESSION: Nonspecific bowel gas pattern. Moderate colonic stool burden, most prominent in the hepatic flexure. Electronically Signed   By: Ruthann Cancer M.D.   On: 04/08/2022 14:00    Scheduled Meds:  budesonide (PULMICORT) nebulizer solution  0.25 mg Nebulization BID   Chlorhexidine Gluconate Cloth  6 each Topical Daily   feeding supplement (GLUCERNA SHAKE)  237 mL Oral BID BM   heparin  5,000 Units Subcutaneous Q8H   insulin aspart  0-9 Units Subcutaneous Q4H   insulin glargine-yfgn  10 Units Subcutaneous QHS   ipratropium-albuterol  3 mL Nebulization BID   leptospermum manuka honey  1 Application Topical Daily   lidocaine  1 patch Transdermal Q24H   polyethylene glycol  17 g Oral BID   Ensure Max Protein  11 oz Oral Daily   senna-docusate  1 tablet Oral BID   tamsulosin  0.4 mg Oral QPC supper   Continuous Infusions:  ampicillin-sulbactam (UNASYN) IV 3 g (04/08/22 1803)    LOS: 4 days   Raiford Noble, DO [facility] Available via Epic secure chat 7am-7pm After these hours, please refer to coverage provider listed on [email or web address] 04/08/2022, 6:13 PM

## 2022-04-08 NOTE — Plan of Care (Signed)

## 2022-04-09 DIAGNOSIS — R338 Other retention of urine: Secondary | ICD-10-CM | POA: Diagnosis not present

## 2022-04-09 DIAGNOSIS — N3000 Acute cystitis without hematuria: Secondary | ICD-10-CM | POA: Diagnosis not present

## 2022-04-09 DIAGNOSIS — R7881 Bacteremia: Secondary | ICD-10-CM

## 2022-04-09 DIAGNOSIS — G9341 Metabolic encephalopathy: Secondary | ICD-10-CM | POA: Diagnosis not present

## 2022-04-09 DIAGNOSIS — A419 Sepsis, unspecified organism: Secondary | ICD-10-CM | POA: Diagnosis not present

## 2022-04-09 LAB — GLUCOSE, CAPILLARY
Glucose-Capillary: 291 mg/dL — ABNORMAL HIGH (ref 70–99)
Glucose-Capillary: 181 mg/dL — ABNORMAL HIGH (ref 70–99)
Glucose-Capillary: 272 mg/dL — ABNORMAL HIGH (ref 70–99)
Glucose-Capillary: 283 mg/dL — ABNORMAL HIGH (ref 70–99)
Glucose-Capillary: 339 mg/dL — ABNORMAL HIGH (ref 70–99)

## 2022-04-09 LAB — CBC WITH DIFFERENTIAL/PLATELET
Abs Immature Granulocytes: 0.04 10*3/uL (ref 0.00–0.07)
Basophils Absolute: 0.1 10*3/uL (ref 0.0–0.1)
Basophils Relative: 1 %
Eosinophils Absolute: 0.1 10*3/uL (ref 0.0–0.5)
Eosinophils Relative: 2 %
HCT: 38 % (ref 36.0–46.0)
Hemoglobin: 12.2 g/dL (ref 12.0–15.0)
Immature Granulocytes: 1 %
Lymphocytes Relative: 28 %
Lymphs Abs: 1.8 10*3/uL (ref 0.7–4.0)
MCH: 27.5 pg (ref 26.0–34.0)
MCHC: 32.1 g/dL (ref 30.0–36.0)
MCV: 85.8 fL (ref 80.0–100.0)
Monocytes Absolute: 0.6 10*3/uL (ref 0.1–1.0)
Monocytes Relative: 9 %
Neutro Abs: 3.7 10*3/uL (ref 1.7–7.7)
Neutrophils Relative %: 59 %
Platelets: 192 10*3/uL (ref 150–400)
RBC: 4.43 MIL/uL (ref 3.87–5.11)
RDW: 14.5 % (ref 11.5–15.5)
WBC: 6.3 10*3/uL (ref 4.0–10.5)
nRBC: 0 % (ref 0.0–0.2)

## 2022-04-09 LAB — MAGNESIUM: Magnesium: 1.8 mg/dL (ref 1.7–2.4)

## 2022-04-09 LAB — COMPREHENSIVE METABOLIC PANEL
ALT: 9 U/L (ref 0–44)
AST: 14 U/L — ABNORMAL LOW (ref 15–41)
Albumin: 2.1 g/dL — ABNORMAL LOW (ref 3.5–5.0)
Alkaline Phosphatase: 54 U/L (ref 38–126)
Anion gap: 7 (ref 5–15)
BUN: 19 mg/dL (ref 8–23)
CO2: 33 mmol/L — ABNORMAL HIGH (ref 22–32)
Calcium: 8.7 mg/dL — ABNORMAL LOW (ref 8.9–10.3)
Chloride: 93 mmol/L — ABNORMAL LOW (ref 98–111)
Creatinine, Ser: 0.47 mg/dL (ref 0.44–1.00)
GFR, Estimated: >60 mL/min (ref >60)
Glucose, Bld: 254 mg/dL — ABNORMAL HIGH (ref 70–99)
Potassium: 4.4 mmol/L (ref 3.5–5.1)
Sodium: 133 mmol/L — ABNORMAL LOW (ref 135–145)
Total Bilirubin: 0.4 mg/dL (ref 0.3–1.2)
Total Protein: 6.1 g/dL — ABNORMAL LOW (ref 6.5–8.1)

## 2022-04-09 LAB — PHOSPHORUS: Phosphorus: 2.9 mg/dL (ref 2.5–4.6)

## 2022-04-09 NOTE — Evaluation (Signed)
Physical Therapy Evaluation Patient Details Name: [name] MRN: [id number] DOB: Jul 30, 1935 Today's Date: 04/09/2022  History of Present Illness  The patient is a 86 year old female with history of diabetes mellitus type 2, complete heart block status post pacemaker, chronic hypoxic respiratory failure on home oxygen at 3 L/min, COPD, unspecified CVA, hypertension, hyperlipidemia, thrombocytopenia, chronic wounds including left metatarsal head osteomyelitis. She was found to have a UTI. SShe was also found to have T12 vertebral fracture.  Surgery felt that she has a T12 osteoporotic compression fracture with associated pain with no retropulsion or canal stenosis.  There is nothing to suggest osteomyelitis or discitis and they are recommending her be treated with bracing for pain control  Clinical Impression    [name] an 86 year old woman who presents with memory deficits, generalized weakness, decreased activity tolerance, impaired balance and pain. Patient typically lives alone and has a caregiver to help her. Unsure of how often the caregiver is there - when asked patient becomes irritated and says "she isn't going to be back." Then states the caregiver helps with groceries, cooking and cleaning. She reports she can typically do her own ADLs. Patient is alert to self but not to month or year. When asked for details she becomes irritable and doesn't answer the question. She does report falls. On evaluation she is mod assist for supine to sit, mod assist x 2 to stand with feet blocked and only able to take a couple of steps before exclaiming "I can't breathe!' She was seated in the recliner and o2 sat was 97%. HR was 128 though.  Patient currently does not have the physical abilities to return home without 24/7 assistance. Patient is not agreeable to rehab stating "I'd rather be dead" but return home is NOT a safe option at this time. Also patient has memory deficits - she repeats  herself, not oriented to time, and cannot remember that it is close to Christmas despite being told twice during evaluation. Patient will benefit from skilled PT services while in hospital to improve deficits and maximize IND and safety.     Recommendations for follow up therapy are one component of a multi-disciplinary discharge planning process, led by the attending physician.  Recommendations may be updated based on patient status, additional functional criteria and insurance authorization.  Follow Up Recommendations Skilled nursing-short term rehab (<3 hours/day) Can patient physically be transported by private vehicle: No    Assistance Recommended at Discharge Frequent or constant Supervision/Assistance  Patient can return home with the following  A lot of help with walking and/or transfers;A little help with bathing/dressing/bathroom;Assistance with cooking/housework;Assist for transportation;Help with stairs or ramp for entrance    Equipment Recommendations None recommended by PT  Recommendations for Other Services       Functional Status Assessment Patient has had a recent decline in their functional status and demonstrates the ability to make significant improvements in function in a reasonable and predictable amount of time.     Precautions / Restrictions Precautions Precautions: Back Precaution Comments: No formal back precautions Restrictions Weight Bearing Restrictions: No Other Position/Activity Restrictions: No brace ordered      Mobility  Bed Mobility Overal bed mobility: Needs Assistance Bed Mobility: Rolling, Sidelying to Sit Rolling: Min assist, Mod assist Sidelying to sit: Mod assist       General bed mobility comments: Mod assist mostly for trunk lift and verbal cues for log roll technique    Transfers Overall transfer level: Needs assistance Equipment  used: Rolling walker (2 wheels) Transfers: Sit to/from Stand, Bed to chair/wheelchair/BSC Sit to  Stand: Mod assist, +2 physical assistance, From elevated surface   Step pivot transfers: Min assist, Mod assist, +2 safety/equipment, +2 physical assistance       General transfer comment: Cues for LE management and use of UEs to self assist.  Physical assist to block feet from slding, to bring wt up and fwd and to balance in standing with RW.  Step pvt bed to recliner with RW    Ambulation/Gait               General Gait Details: step pvt bed to chair only - limited by fatigue and c/o difficulty breathing - O2 sat 98%  Stairs            Wheelchair Mobility    Modified Rankin (Stroke Patients Only)       Balance                                             Pertinent Vitals/Pain Pain Assessment Pain Assessment: Faces Faces Pain Scale: Hurts little more Pain Location: back - with sitting Pain Descriptors / Indicators: Guarding, Discomfort, Grimacing Pain Intervention(s): Limited activity within patient's tolerance, Monitored during session    Home Living Family/patient expects to be discharged to:: Unsure Living Arrangements: Alone;Other (Comment)                 Additional Comments: likely needs more frequent assistance due to cognitive deficits and frequent falls    Prior Function Prior Level of Function : Needs assist;Patient poor historian/Family not available  Cognitive Assist : ADLs (cognitive)           Mobility Comments: pt ambulates with use of rollator in the home but admits to frequent falls ADLs Comments: pt reports completing most ADLs, however at present, she is confused with very poor short term memory. She states that caregiver helps with grocery shopping, cleaning and laundry. Says she comes every day.     Hand Dominance   Dominant Hand: Right    Extremity/Trunk Assessment   Upper Extremity Assessment Upper Extremity Assessment: Overall WFL for tasks assessed    Lower Extremity Assessment Lower Extremity  Assessment: Generalized weakness (chronic L metatarsal head osteomyelitis)    Cervical / Trunk Assessment Cervical / Trunk Assessment: Kyphotic  Communication   Communication: No difficulties  Cognition Arousal/Alertness: Awake/alert Behavior During Therapy: WFL for tasks assessed/performed Overall Cognitive Status: No family/caregiver present to determine baseline cognitive functioning                                 General Comments: Alert to self, birthday, Hospital. Doesn't know the year, month or situation. Recent timeline runs together. Covers with standard or broad statements. Short term memory loss- can't remember the Month despite being told and repeating herself        General Comments      Exercises     Assessment/Plan    PT Assessment Patient needs continued PT services  PT Problem List Decreased strength;Decreased activity tolerance;Decreased balance;Decreased mobility;Decreased knowledge of use of DME;Pain;Decreased safety awareness       PT Treatment Interventions DME instruction;Gait training;Stair training;Functional mobility training;Therapeutic activities;Therapeutic exercise;Balance training;Patient/family education    PT Goals (Current goals can be found in the  Care Plan section)  Acute Rehab PT Goals Patient Stated Goal: HOME PT Goal Formulation: With patient Time For Goal Achievement: 04/23/22 Potential to Achieve Goals: Fair    Frequency Min 3X/week     Co-evaluation PT/OT/SLP Co-Evaluation/Treatment: Yes Reason for Co-Treatment: For patient/therapist safety;To address functional/ADL transfers PT goals addressed during session: Mobility/safety with mobility OT goals addressed during session: ADL's and self-care       AM-PAC PT "6 Clicks" Mobility  Outcome Measure Help needed turning from your back to your side while in a flat bed without using bedrails?: A Lot Help needed moving from lying on your back to sitting on the side  of a flat bed without using bedrails?: A Lot Help needed moving to and from a bed to a chair (including a wheelchair)?: A Lot Help needed standing up from a chair using your arms (e.g., wheelchair or bedside chair)?: A Lot Help needed to walk in hospital room?: Total Help needed climbing 3-5 steps with a railing? : Total 6 Click Score: 10    End of Session Equipment Utilized During Treatment: Gait belt Activity Tolerance: Patient limited by fatigue;Patient limited by pain Patient left: in chair;with call bell/phone within reach;with chair alarm set Nurse Communication: Mobility status PT Visit Diagnosis: Unsteadiness on feet (R26.81);Muscle weakness (generalized) (M62.81);Repeated falls (R29.6);Pain;Difficulty in walking, not elsewhere classified (R26.2)    Time: 5038-8828 PT Time Calculation (min) (ACUTE ONLY): 31 min   Charges:   PT Evaluation $PT Eval Low Complexity: 1 Low          [facility] Pager [phone] Office [phone]   Mihran Lebarron 04/09/2022, 4:49 PM

## 2022-04-09 NOTE — Progress Notes (Signed)
PROGRESS NOTE    [name]  KVQ:[id number] DOB: 1935/06/24 DOA: 04/04/2022 PCP: Wardell Honour, MD   Brief Narrative:  The patient is a 86 year old female with history of diabetes mellitus type 2, complete heart block status post pacemaker, chronic hypoxic respiratory failure on home oxygen at 3 L/min, COPD, unspecified CVA, hypertension, hyperlipidemia, thrombocytopenia, chronic wounds including left metatarsal head osteomyelitis followed by Dr. Drucilla Schmidt on doxycycline and cefadroxil presented with altered mental status. On presentation, she was hypoglycemic and found to have UTI. She was started on IV fluids and broad-spectrum antibiotics. She was found to have polymicrobial bacteremia: ID was consulted; antibiotics switched to Unasyn. She was also found to have T12 vertebral fracture.    Assessment and Plan: Polymicrobial Bacteremia Sepsis: Present on Admisison -Blood cultures on admission positive for Enterococcus faecalis and Rothia. questionable cause.   -ID following.  Repeat blood cultures from 04/06/2022 are negative so far.  Currently on Unasyn as per ID. -2D echo was poor study which showed EF of 45 to 50% but no obvious vegetation. If the patient has persistent bacteremia, might need TEE -Per ID we will wait for repeat blood cultures 72 hours after to decide whether TEE is indicated especially due to low suspicion of endocarditis with polymicrobial growth we will bacteremia.  Repeat blood cultures on 04/06/2022 showed no growth to date at 3 days and will defer to ID about TEE   UTI/Acute cystitis without hematuria -Urine cultures negative so far.  Antibiotics as above   Acute metabolic encephalopathy -Possibly from above.  Mental status improving.   -Still slow to respond and slightly confused to time.  Fall precautions.  Monitor mental status.  PT eval.   Acute Urinary Retention -Currently has a Foley catheter.  Start Flomax.   -Will give voiding trial in the next few  days -Flush foley    T12 vertebral fracture -Cannot have the MRI because of pacemaker.   -CT shows acute T12 vertebral compression fracture. -Neurosurgery consulted as per ID recommendations.  Surgery feels that she has a T12 osteoporotic compression fracture with associated pain with no retropulsion or canal stenosis.  There is nothing to suggest osteomyelitis or discitis and they are recommending her be treated with bracing for pain control and given her osteomyelitis and bacteremia and history of sacral decubitus ulcer she is not a candidate for any type of intervention -PT/OT Eval recommending SNF   Left Foot Lateral  osteomyelitis Left Lateral Foot Callus -Patient has received more than 7 weeks of oral doxycycline and cefadroxil. -Dr. Starla Link had consulted podiatry as per ID recommendations to see if the left lateral foot callus needs any further investigation or treatment. -Infectious diseases feels that she needs no further antibiotics for the right foot given that she is getting 7+ weeks of doxycycline and then subsequently cefadroxil since 03/14/2020 -The podiatry team has evaluated her and patient continues to refuse her MRI as well as amputation and therefore they believe that she would benefit from long-term IV antibiotic management and remains on IV Unasyn -Podiatry feels that she is at high risk of losing her digit versus her foot versus the leg and they are recommending continuing local wound care with Betadine wet-to-dry dressings -PT/OT evaluating and recommending SNF   Leukocytosis -Resolved and WBC is 6.3 and improved  -Continue to Monitor and Trend -Repeat CBC in the AM    Hyponatremia -Mild and improved as sodium has gone from 134 is now 136 yesterday but today is 133 -Continue  monitor and trend and repeat CMP in a.m.   Hypomagnesemia -Replete with IV mag sulfate 4 g total -Mag Level went is now 1.8 -Continue to monitor and trend and repeat CMP in a.m.   Severe  protein calorie malnutrition -Nutrition Status: Nutrition Problem: Severe Malnutrition Etiology: chronic illness Signs/Symptoms: severe fat depletion, severe muscle depletion, percent weight loss Percent weight loss: 31 % (in 1 year) Interventions: Refer to RD note for recommendations, Premier Protein, Glucerna shake   Pressure Ulcers -Various wounds: I have personally evaluated and agree with the documentation of the wounds as below Pressure Injury 01/11/22 Sacrum Mid;Medial Stage 1 -  Intact skin with non-blanchable redness of a localized area usually over a bony prominence. (Active)  01/11/22 0226  Location: Sacrum  Location Orientation: Mid;Medial  Staging: Stage 1 -  Intact skin with non-blanchable redness of a localized area usually over a bony prominence.  Wound Description (Comments):   Present on Admission: Yes     Pressure Injury 04/05/22 Hip Anterior;Left;Proximal Stage 2 -  Partial thickness loss of dermis presenting as a shallow open injury with a red, pink wound bed without slough. Round 1cmx1cm pressure sore, yellow center with pink edges, dry wound bed (Active)  04/05/22 0000  Location: Hip  Location Orientation: Anterior;Left;Proximal  Staging: Stage 2 -  Partial thickness loss of dermis presenting as a shallow open injury with a red, pink wound bed without slough.  Wound Description (Comments): Round 1cmx1cm pressure sore, yellow center with pink edges, dry wound bed  Present on Admission: Yes     Pressure Injury 04/05/22 Toe (Comment  which one) Left;Lateral Unstageable - Full thickness tissue loss in which the base of the injury is covered by slough (yellow, tan, gray, green or brown) and/or eschar (tan, brown or black) in the wound bed. 2cmx1cm (Active)  04/05/22 0000  Location: Toe (Comment  which one) (outer aspect of pinky toe)  Location Orientation: Left;Lateral  Staging: Unstageable - Full thickness tissue loss in which the base of the injury is covered by  slough (yellow, tan, gray, green or brown) and/or eschar (tan, brown or black) in the wound bed.  Wound Description (Comments): 2cmx1cm oval shaped wound, covered with grey/brown scabbing which has hair/fuzz? imbedded in it, dry with no drainage  Present on Admission: Yes  -C/w WOC Consult   History of Complete Heart Block status post Pacemaker -Outpatient follow-up with cardiology/EP   COPD Chronic respiratory failure with hypoxia -Currently stable -SpO2: 94 % -Continue current nebs   Diabetes mellitus type II with Hyperglycemia -Continue CBGs with SSI.  A1c 12.9 -Increase Semglee to 12 units nightly and added 3 units of NovoLog 3 times daily with meals and she needs further blood pressure control and continuing NovoLog 0 to 9 units every 4h -CBGs ranging from 181-283   Hypoalbuminemia -The patient's albumin level is now 2.2 -> 2.1 -Cpntinue to monitor and trend and repeat CMP in a.m.   Abdominal pain with constipation -Has not had a bowel movement in several days -Check KUB and showed "Nonspecific bowel gas pattern. Moderate colonic stool burden, most prominent in the hepatic flexure." -Restart stool softeners with senna docusate 1 tab p.o. twice daily, MiraLAX 17 g p.o. twice daily, as well as bisacodyl 10 g rectal suppository as needed -Monitor Bowel Movements    Goals of care -Discussed with patient who is more awake and responsive and agrees for DNR.   -Consulted palliative care for goals of care discussion.   DVT prophylaxis: heparin  injection 5,000 Units Start: 04/05/22 0600 SCDs Start: 04/05/22 0018    Code Status: DNR Family Communication: No family currently at bedside  Disposition Plan:  Level of care: Med-Surg Status is: Inpatient Remains inpatient appropriate because: PT OT recommending SNF and will need a safe discharge disposition and will need clearance by the specialist and have ID weigh in about whether she needs a TEE and antibiotic duration and  management   Consultants:  Podiatry Palliative Care Medicine ID Neurosurgery  Procedures:  As delineated as above  Antimicrobials:  Anti-infectives (From admission, onward)    Start     Dose/Rate Route Frequency Ordered Stop   04/05/22 1700  Ampicillin-Sulbactam (UNASYN) 3 g in sodium chloride 0.9 % 100 mL IVPB        3 g 200 mL/hr over 30 Minutes Intravenous Every 6 hours 04/05/22 1643     04/05/22 0800  cefTRIAXone (ROCEPHIN) 1 g in sodium chloride 0.9 % 100 mL IVPB  Status:  Discontinued        1 g 200 mL/hr over 30 Minutes Intravenous Every 24 hours 04/05/22 0017 04/05/22 1643   04/04/22 2145  cefTRIAXone (ROCEPHIN) 1 g in sodium chloride 0.9 % 100 mL IVPB        1 g 200 mL/hr over 30 Minutes Intravenous  Once 04/04/22 2135 04/04/22 2240       Subjective: Seen and examined at bedside and the patient was doing okay and wanting to go home.  States that she is still feels very weak.  Complains of back pain.  Does not want a total amputation and does not want to go under MRI.  PT OT evaluated and recommending SNF.  Will defer to ID whether to obtain a TEE or not.  No other concerns or complaints at this time.  Objective: Vitals:   04/09/22 0347 04/09/22 0738 04/09/22 0924 04/09/22 1252  BP:  111/63  117/64  Pulse:  94  (!) 104  Resp:  20  20  Temp:  98.3 F (36.8 C)  98.4 F (36.9 C)  TempSrc:  Oral  Oral  SpO2:  91% 92% 94%  Weight: 52.2 kg     Height:        Intake/Output Summary (Last 24 hours) at 04/09/2022 1759 Last data filed at 04/09/2022 1411 Gross per 24 hour  Intake 235 ml  Output 3000 ml  Net -2765 ml   Filed Weights   04/05/22 0034 04/05/22 0415 04/09/22 0347  Weight: 47.5 kg 47.5 kg 52.2 kg   Examination: Physical Exam:  Constitutional: Thin frail elderly Caucasian female who is chronically ill-appearing in no acute distress but appears fatigued and complaining of some pain Respiratory: Diminished to auscultation bilaterally, no wheezing, rales,  rhonchi or crackles. Normal respiratory effort and patient is not tachypenic. No accessory muscle use.  Unlabored breathing Cardiovascular: RRR, no murmurs / rubs / gallops. S1 and S2 auscultated.  Abdomen: Soft, non-tender, non-distended.  Bowel sounds positive.  GU: Deferred. Musculoskeletal: No clubbing / cyanosis of digits/nails. No joint deformity upper and lower extremities.  Skin: Skin lesions and ulcers noted on her left foot Neurologic: CN 2-12 grossly intact with no focal deficits. Romberg sign and cerebellar reflexes not assessed.  Psychiatric: Normal judgment and insight. Alert and oriented x 3. Normal mood and appropriate affect.   Data Reviewed: I have personally reviewed following labs and imaging studies  CBC: Recent Labs  Lab 04/05/22 0040 04/06/22 0402 04/07/22 0358 04/08/22 0403 04/09/22 0322  WBC 11.8*  6.8 7.7 6.6 6.3  NEUTROABS 9.4*  --   --  4.1 3.7  HGB 14.2 12.9 13.0 13.5 12.2  HCT 46.4* 41.2 41.9 42.3 38.0  MCV 88.0 86.6 86.4 86.5 85.8  PLT 246 197 194 176 253   Basic Metabolic Panel: Recent Labs  Lab 04/05/22 0040 04/06/22 0402 04/07/22 0358 04/08/22 0403 04/09/22 0322  NA 135 135 134* 136 133*  K 4.7 3.9 3.9 4.0 4.4  CL 98 98 96* 94* 93*  CO2 25 28 32 35* 33*  GLUCOSE 391* 211* 166* 174* 254*  BUN 31* 16 16 18 19   CREATININE 0.85 0.62 0.69 0.70 0.47  CALCIUM 9.0 8.5* 8.5* 9.0 8.7*  MG 1.8  --   --  1.4* 1.8  PHOS  --   --   --   --  2.9   GFR: Estimated Creatinine Clearance: 43.1 mL/min (by C-G formula based on SCr of 0.47 mg/dL). Liver Function Tests: Recent Labs  Lab 04/04/22 1816 04/05/22 0040 04/08/22 0403 04/09/22 0322  AST 11* 9* 13* 14*  ALT 7 9 9 9   ALKPHOS 59 63 55 54  BILITOT 0.8 0.8 0.2* 0.4  PROT 6.3* 6.9 6.1* 6.1*  ALBUMIN 2.2* 2.5* 2.2* 2.1*   Recent Labs  Lab 04/04/22 1816  LIPASE 25   No results for input(s): "AMMONIA" in the last 168 hours. Coagulation Profile: No results for input(s): "INR", "PROTIME" in  the last 168 hours. Cardiac Enzymes: No results for input(s): "CKTOTAL", "CKMB", "CKMBINDEX", "TROPONINI" in the last 168 hours. BNP (last 3 results) No results for input(s): "PROBNP" in the last 8760 hours. HbA1C: No results for input(s): "HGBA1C" in the last 72 hours. CBG: Recent Labs  Lab 04/08/22 2327 04/09/22 0341 04/09/22 0735 04/09/22 1226 04/09/22 1548  GLUCAP 186* 291* 181* 272* 283*   Lipid Profile: No results for input(s): "CHOL", "HDL", "LDLCALC", "TRIG", "CHOLHDL", "LDLDIRECT" in the last 72 hours. Thyroid Function Tests: No results for input(s): "TSH", "T4TOTAL", "FREET4", "T3FREE", "THYROIDAB" in the last 72 hours. Anemia Panel: No results for input(s): "VITAMINB12", "FOLATE", "FERRITIN", "TIBC", "IRON", "RETICCTPCT" in the last 72 hours. Sepsis Labs: Recent Labs  Lab 04/04/22 1816 04/05/22 0040  LATICACIDVEN 2.0* 1.9   Recent Results (from the past 240 hour(s))  Urine Culture     Status: None   Collection Time: 04/04/22  1:36 PM   Specimen: Urine, Clean Catch  Result Value Ref Range Status   Specimen Description   Final    URINE, CLEAN CATCH Performed at [facility], [place] [address] [place], [place] [address]    Special Requests   Final    NONE Performed at [facility], [place] [address] [place], [place] [address]    Culture   Final    NO GROWTH Performed at [facility], [place] [address] [place], [place] [address]    Report Status 04/05/2022 FINAL  Final  Culture, blood (single)     Status: Abnormal   Collection Time: 04/04/22  6:16 PM   Specimen: BLOOD  Result Value Ref Range Status   Specimen Description   Final    BLOOD SITE NOT SPECIFIED Performed at [place] [address] [place],  [address]    Special Requests   Final    BOTTLES DRAWN AEROBIC AND ANAEROBIC Blood Culture results may not be optimal due to an inadequate volume of blood received in culture  bottles Performed at [place] [address] [place], [place] [address]    Culture  Setup Time   Final    GRAM POSITIVE COCCI IN CHAINS ANAEROBIC BOTTLE ONLY CRITICAL RESULT CALLED TO, READ BACK BY AND VERIFIED WITH: PHARMD C. SHADE [id number] @1607  FH GRAM POSITIVE COCCI IN CLUSTERS BOTTLES DRAWN AEROBIC ONLY CRITICAL RESULT CALLED TO, READ BACK BY AND VERIFIED WITH: PHARMD [place] 04/06/22 @ 0707 BY AB    Culture (A)  Final    ENTEROCOCCUS FAECALIS ROTHIA MUCILAGINOSA Standardized susceptibility testing for this organism is not available. Performed at [facility], [place] [address] [place], [place] [address]    Report Status 04/07/2022 FINAL  Final   Organism ID, Bacteria ENTEROCOCCUS FAECALIS  Final      Susceptibility   Enterococcus faecalis - MIC*    AMPICILLIN <=2 SENSITIVE Sensitive     VANCOMYCIN 2 SENSITIVE Sensitive     GENTAMICIN SYNERGY SENSITIVE Sensitive     * ENTEROCOCCUS FAECALIS  Blood Culture ID Panel (Reflexed)     Status: Abnormal   Collection Time: 04/04/22  6:16 PM  Result Value Ref Range Status   Enterococcus faecalis DETECTED (A) NOT DETECTED Final    Comment: CRITICAL RESULT CALLED TO, READ BACK BY AND VERIFIED WITH: PHARMD C. SHADE [id number] @1607  FH    Enterococcus Faecium NOT DETECTED NOT DETECTED Final   Listeria monocytogenes NOT DETECTED NOT DETECTED Final   Staphylococcus species NOT DETECTED NOT DETECTED Final   Staphylococcus aureus (BCID) NOT DETECTED NOT DETECTED Final   Staphylococcus epidermidis NOT DETECTED NOT DETECTED Final   Staphylococcus lugdunensis NOT DETECTED NOT DETECTED Final   Streptococcus species DETECTED (A) NOT DETECTED Final    Comment: Not Enterococcus species, Streptococcus agalactiae, Streptococcus pyogenes, or Streptococcus pneumoniae. CRITICAL RESULT CALLED TO, READ BACK BY AND VERIFIED WITH: PHARMD C. SHADE [phone] @1607  FH    Streptococcus agalactiae NOT DETECTED NOT DETECTED Final    Streptococcus pneumoniae NOT DETECTED NOT DETECTED Final   Streptococcus pyogenes NOT DETECTED NOT DETECTED Final   A.calcoaceticus-baumannii NOT DETECTED NOT DETECTED Final   Bacteroides fragilis NOT DETECTED NOT DETECTED Final   Enterobacterales NOT DETECTED NOT DETECTED Final   Enterobacter cloacae complex NOT DETECTED NOT DETECTED Final   Escherichia coli NOT DETECTED NOT DETECTED Final   Klebsiella aerogenes NOT DETECTED NOT DETECTED Final   Klebsiella oxytoca NOT DETECTED NOT DETECTED Final   Klebsiella pneumoniae NOT DETECTED NOT DETECTED Final   Proteus species NOT DETECTED NOT DETECTED Final   Salmonella species NOT DETECTED NOT DETECTED Final   Serratia marcescens NOT DETECTED NOT DETECTED Final   Haemophilus influenzae NOT DETECTED NOT DETECTED Final   Neisseria meningitidis NOT DETECTED NOT DETECTED Final   Pseudomonas aeruginosa NOT DETECTED NOT DETECTED Final   Stenotrophomonas maltophilia NOT DETECTED NOT DETECTED Final   Candida albicans NOT DETECTED NOT DETECTED Final   Candida auris NOT DETECTED NOT DETECTED Final   Candida glabrata NOT DETECTED NOT DETECTED Final   Candida krusei NOT DETECTED NOT DETECTED Final   Candida parapsilosis NOT DETECTED NOT DETECTED Final   Candida tropicalis NOT DETECTED NOT DETECTED Final   Cryptococcus neoformans/gattii NOT DETECTED NOT DETECTED Final   Vancomycin resistance NOT DETECTED NOT DETECTED Final    Comment: Performed at [facility], [name], [place], [place] [address]  Blood culture (routine x 2)     Status: None (Preliminary result)   Collection Time: 04/04/22  9:36 PM   Specimen: BLOOD  Result Value Ref Range Status   Specimen  Description   Final    BLOOD RIGHT ANTECUBITAL Performed at [place] [address] [place], [place] [address]    Special Requests   Final    BOTTLES DRAWN AEROBIC ONLY Blood Culture results may not be optimal due to an inadequate volume of blood received in  culture bottles Performed at [place] [address] [place], [place] [address]    Culture   Final    NO GROWTH 4 DAYS Performed at [facility], [place] [address] [place], [place] [address]    Report Status PENDING  Incomplete  Blood culture (routine x 2)     Status: None (Preliminary result)   Collection Time: 04/04/22  9:41 PM   Specimen: BLOOD  Result Value Ref Range Status   Specimen Description   Final    BLOOD LEFT ANTECUBITAL Performed at [place] [address], [place], [place] [address]    Special Requests   Final    BOTTLES DRAWN AEROBIC ONLY Blood Culture results may not be optimal due to an inadequate volume of blood received in culture bottles Performed at [place] [address], [place], [place] [address]    Culture   Final    NO GROWTH 4 DAYS Performed at [facility], [place] [address] [place], [place] [address]    Report Status PENDING  Incomplete  Blood Culture ID Panel (Reflexed)     Status: None   Collection Time: 04/06/22  5:43 AM  Result Value Ref Range Status   Enterococcus faecalis NOT DETECTED NOT DETECTED Final   Enterococcus Faecium NOT DETECTED NOT DETECTED Final   Listeria monocytogenes NOT DETECTED NOT DETECTED Final   Staphylococcus species NOT DETECTED NOT DETECTED Final   Staphylococcus aureus (BCID) NOT DETECTED NOT DETECTED Final   Staphylococcus epidermidis NOT DETECTED NOT DETECTED Final   Staphylococcus lugdunensis NOT DETECTED NOT DETECTED Final   Streptococcus species NOT DETECTED NOT DETECTED Final   Streptococcus agalactiae NOT DETECTED NOT DETECTED Final   Streptococcus pneumoniae NOT DETECTED NOT DETECTED Final   Streptococcus pyogenes NOT DETECTED NOT DETECTED Final   A.calcoaceticus-baumannii NOT DETECTED NOT DETECTED Final   Bacteroides fragilis NOT DETECTED NOT DETECTED Final   Enterobacterales NOT DETECTED NOT DETECTED Final   Enterobacter  cloacae complex NOT DETECTED NOT DETECTED Final   Escherichia coli NOT DETECTED NOT DETECTED Final   Klebsiella aerogenes NOT DETECTED NOT DETECTED Final   Klebsiella oxytoca NOT DETECTED NOT DETECTED Final   Klebsiella pneumoniae NOT DETECTED NOT DETECTED Final   Proteus species NOT DETECTED NOT DETECTED Final   Salmonella species NOT DETECTED NOT DETECTED Final   Serratia marcescens NOT DETECTED NOT DETECTED Final   Haemophilus influenzae NOT DETECTED NOT DETECTED Final   Neisseria meningitidis NOT DETECTED NOT DETECTED Final   Pseudomonas aeruginosa NOT DETECTED NOT DETECTED Final   Stenotrophomonas maltophilia NOT DETECTED NOT DETECTED Final   Candida albicans NOT DETECTED NOT DETECTED Final   Candida auris NOT DETECTED NOT DETECTED Final   Candida glabrata NOT DETECTED NOT DETECTED Final   Candida krusei NOT DETECTED NOT DETECTED Final   Candida parapsilosis NOT DETECTED NOT DETECTED Final   Candida tropicalis NOT DETECTED NOT DETECTED Final   Cryptococcus neoformans/gattii NOT DETECTED NOT DETECTED Final    Comment: Performed at [facility], [place] [address], [place], [place] [address]  Culture, blood (Routine X 2) w Reflex to ID Panel  Status: None (Preliminary result)   Collection Time: 04/06/22 12:02 PM   Specimen: BLOOD LEFT HAND  Result Value Ref Range Status   Specimen Description   Final    BLOOD LEFT HAND Performed at [place] [address], [place], [place] [address]    Special Requests   Final    IN PEDIATRIC BOTTLE Blood Culture adequate volume Performed at [place] [address] [place], [place] [address]    Culture   Final    NO GROWTH 3 DAYS Performed at [facility], [place] [address] [place], [place] [address]    Report Status PENDING  Incomplete  Culture, blood (Routine X 2) w Reflex to ID Panel     Status: None (Preliminary result)   Collection Time: 04/06/22 12:02 PM   Specimen: BLOOD LEFT  ARM  Result Value Ref Range Status   Specimen Description   Final    BLOOD LEFT ARM Performed at [place] [address] [place], [place] [address]    Special Requests   Final    IN PEDIATRIC BOTTLE Blood Culture adequate volume Performed at [place] [address], [place], [place] [address]    Culture   Final    NO GROWTH 3 DAYS Performed at [facility], [place] [address], [place],  [address]    Report Status PENDING  Incomplete    Radiology Studies: DG Abd 1 View  Result Date: 04/08/2022 CLINICAL DATA:  86 year old female with abdominal distension. EXAM: ABDOMEN - 1 VIEW COMPARISON:  04/04/2022 FINDINGS: Mild diffuse gaseous distension of the visualized small bowel. Large volume stool burden about the hepatic flexure. Colonic gas is visualized distally. Surgical clips in the right upper quadrant. Similar appearing dextroconvex curvature of the lumbar spine. IMPRESSION: Nonspecific bowel gas pattern. Moderate colonic stool burden, most prominent in the hepatic flexure. Electronically Signed   By: Ruthann Cancer M.D.   On: 04/08/2022 14:00    Scheduled Meds:  budesonide (PULMICORT) nebulizer solution  0.25 mg Nebulization BID   Chlorhexidine Gluconate Cloth  6 each Topical Daily   feeding supplement (GLUCERNA SHAKE)  237 mL Oral BID BM   heparin  5,000 Units Subcutaneous Q8H   insulin aspart  0-9 Units Subcutaneous Q4H   insulin glargine-yfgn  10 Units Subcutaneous QHS   ipratropium-albuterol  3 mL Nebulization BID   leptospermum manuka honey  1 Application Topical Daily   lidocaine  1 patch Transdermal Q24H   polyethylene glycol  17 g Oral BID   Ensure Max Protein  11 oz Oral Daily   senna-docusate  1 tablet Oral BID   tamsulosin  0.4 mg Oral QPC supper   Continuous Infusions:  ampicillin-sulbactam (UNASYN) IV 3 g (04/09/22 1247)    LOS: 5 days   Raiford Noble, DO [facility] Available via Epic secure chat  7am-7pm After these hours, please refer to coverage provider listed on [email or web address] 04/09/2022, 5:59 PM

## 2022-04-09 NOTE — Progress Notes (Addendum)
Daily Progress Note   Patient Name: [name]       Date: 04/09/2022 DOB: 07-15-1935  Age: 86 y.o. MRN#: [id number] Attending Physician: Kerney Elbe, DO Primary Care Physician: Wardell Honour, MD Admit Date: 04/04/2022  Reason for Consultation/Follow-up: Establishing goals of care  Subjective: Awake alert oriented resting in bed, denies complaints.  Discussed with patient again about broad goals of care, see below.  Length of Stay: 5  Current Medications: Scheduled Meds:   budesonide (PULMICORT) nebulizer solution  0.25 mg Nebulization BID   Chlorhexidine Gluconate Cloth  6 each Topical Daily   feeding supplement (GLUCERNA SHAKE)  237 mL Oral BID BM   heparin  5,000 Units Subcutaneous Q8H   insulin aspart  0-9 Units Subcutaneous Q4H   insulin glargine-yfgn  10 Units Subcutaneous QHS   ipratropium-albuterol  3 mL Nebulization BID   leptospermum manuka honey  1 Application Topical Daily   lidocaine  1 patch Transdermal Q24H   polyethylene glycol  17 g Oral BID   Ensure Max Protein  11 oz Oral Daily   senna-docusate  1 tablet Oral BID   tamsulosin  0.4 mg Oral QPC supper    Continuous Infusions:  ampicillin-sulbactam (UNASYN) IV 3 g (04/09/22 1247)    PRN Meds: acetaminophen, bisacodyl, ondansetron (ZOFRAN) IV  Physical Exam         Frail appearing lady Chronically ill-appearing No acute distress Diminished breath sounds S1-S2 No edema Generalized weakness  Vital Signs: BP 117/64 (BP Location: Right Arm)   Pulse (!) 104   Temp 98.4 F (36.9 C) (Oral)   Resp 20   Ht 5' 3"  (1.6 m)   Wt 52.2 kg   SpO2 94%   BMI 20.37 kg/m  SpO2: SpO2: 94 % O2 Device: O2 Device: Room Air O2 Flow Rate:    Intake/output summary:  Intake/Output Summary (Last 24  hours) at 04/09/2022 1531 Last data filed at 04/09/2022 1411 Gross per 24 hour  Intake 355 ml  Output 3000 ml  Net -2645 ml   LBM: Last BM Date : 04/06/22 Baseline Weight: Weight: 54 kg Most recent weight: Weight: 52.2 kg       Palliative Assessment/Data:      Patient Active Problem List   Diagnosis Date Noted   Acute cystitis without hematuria 04/04/2022  Acute urinary retention 04/04/2022   T12 vertebral fracture ([place]) 04/04/2022   Peripheral arterial disease ([place]) 03/22/2022   Osteomyelitis of fifth toe of left foot ([place]) 03/14/2022   Urinary frequency 03/01/2022   Hyperglycemia due to type 2 diabetes mellitus ([place]) 01/10/2022   History of CVA (cerebrovascular accident) 01/10/2022   Dementia ([place]) 12/09/2021   Polycythemia 12/09/2021   Fall    Stricture and stenosis of esophagus    Abnormal esophagram    Dysphagia    Protein-calorie malnutrition, severe 10/03/2021   Diabetic foot ulcers ([place]) - right foot 10/02/2021   Sepsis without acute organ dysfunction ([place]) 10/02/2021   Seborrheic dermatitis 10/12/2020   Pain due to onychomycosis of toenails of both feet 09/01/2020   Sjogren's syndrome with keratoconjunctivitis sicca ([place]) 06/07/2020   Mild cognitive impairment with memory loss 01/29/2020   Full code status 01/26/2020   Pacemaker 01/19/2020   Pressure injury of skin 01/14/2020   Complete heart block (HCC)    Acute metabolic encephalopathy    Acute on chronic respiratory failure with hypoxia (HCC)    Scoliosis due to degenerative disease of spine in adult patient 09/10/2018   Radicular pain of left lower extremity 09/10/2018   Hand arthritis 10/11/2017   Uncontrolled type 2 diabetes mellitus with hyperglycemia, with long-term current use of insulin ([place]) 03/08/2017   Thrombocytopenia ([place]) 11/08/2016   Degenerative lumbar spinal stenosis 10/27/2015   Arthralgia 09/22/2015   Chronic respiratory failure with hypoxia ([place]) - home O2 of 2-3 L/min 01/11/2015    Diabetic polyneuropathy associated with type 2 diabetes mellitus ([place]) 11/16/2014   Hypertension 05/19/2013   Vertigo 05/11/2013   Dizziness 05/11/2013   Hyperlipidemia 02/15/2013   Uncontrolled type 2 diabetes mellitus with hyperglycemia (HCC) 01/01/2013   Benign paroxysmal positional vertigo 08/09/2012   Type II or unspecified type diabetes mellitus with neurological manifestations, uncontrolled(250.62) 08/09/2012   GLAUCOMA 03/16/2010   COPD mixed type with chronic respiratory failure  01/15/2009   Seasonal and perennial allergic rhinitis 11/23/2007   CHRONIC RHINITIS 07/16/2007   GERD (gastroesophageal reflux disease) 05/29/2007   I B S-DIARRHEAL PREDOMINATE 05/29/2007   FATTY LIVER DISEASE 05/29/2007    Palliative Care Assessment & Plan   Patient Profile:    Assessment: 86 year old lady, known to palliative medicine service, seen in a previous hospitalization.  Patient currently lives in an apartment with her 2 cats.  Patient has underlying history of diabetes COPD fatty liver disease hypertension dyslipidemia complete heart block status post pacemaker.  Patient used to work as a Primary school teacher.    Patient is currently admitted to hospital medicine service with infectious disease as well as podiatry specialists consulted.  Patient has left submetatarsal ulceration with possible underlying osteomyelitis.  She is on antibiotics. She has disease has also been following, patient has acute cystitis without hematuria, also found to have polymicrobial bacteremia E faecalis gram-positive cocci in clusters.  Patient states that she had a fall and is found to have T12 osteoporotic compression fracture with some associated pain.  Curbside neurosurgical input was also sought.   Palliative medicine team consulted for ongoing goals of care discussions.   Recommendations/Plan: Goals of care discussions undertaken with patient.  She does not want to participate in physical therapy, does not  believe that she would want to go to skilled nursing facility for rehab.  She wishes to continue with antibiotics.  Her goals are not for comfort measures only, she does not want hospice just yet.  She is accepting of palliative  support in the outpatient setting.   Code Status:    Code Status Orders  (From admission, onward)           Start     Ordered   04/07/22 1350  Do not attempt resuscitation (DNR)  Continuous       Question Answer Comment  In the event of cardiac or respiratory ARREST Do not call a "code blue"   In the event of cardiac or respiratory ARREST Do not perform Intubation, CPR, defibrillation or ACLS   In the event of cardiac or respiratory ARREST Use medication by any route, position, wound care, and other measures to relive pain and suffering. May use oxygen, suction and manual treatment of airway obstruction as needed for comfort.      04/07/22 1349           Code Status History     Date Active Date Inactive Code Status Order ID Comments User Context   04/05/2022 0017 04/07/2022 1349 Full Code [id number]  Kristopher Oppenheim, DO Inpatient   01/10/2022 1905 01/13/2022 2132 DNR [id number]  Orma Flaming, MD ED   01/10/2022 1842 01/10/2022 1905 DNR [id number]  Orma Flaming, MD ED   12/08/2021 2359 12/12/2021 1918 Full Code [id number]  Clance Boll, MD ED   11/09/2021 1753 11/13/2021 2016 DNR [id number]  Lacinda Axon, MD ED   10/02/2021 2336 10/07/2021 1804 Full Code [id number]  Kristopher Oppenheim, DO Inpatient   08/09/2021 1421 08/13/2021 2226 Full Code [id number]  Karmen Bongo, MD ED   01/26/2020 2113 08/09/2021 1038 Full Code [id number]  Ngetich, Nelda Bucks, NP Outpatient   01/19/2020 0825 01/23/2020 2013 Full Code [id number]  Norval Morton, MD ED   01/13/2020 1408 01/17/2020 0036 Full Code [id number]  Mckinley Jewel, MD ED   11/08/2016 1649 11/10/2016 1837 Full Code [id number]  Lily Kocher, MD ED   05/11/2013 0746 05/13/2013 1852 Full Code [id number]  Theressa Millard, MD Inpatient   05/11/2013 0243  05/11/2013 0746 Full Code [id number]  Theressa Millard, MD Inpatient       Prognosis:  Unable to determine  Discharge Planning: Home with [place] was discussed with  patient  Thank you for allowing the Palliative Medicine Team to assist in the care of this patient.  Mod MDM    Greater than 50%  of this time was spent counseling and coordinating care related to the above assessment and plan.  Loistine Chance, MD  Please contact Palliative Medicine Team phone at [phone] for questions and concerns.

## 2022-04-09 NOTE — Evaluation (Addendum)
Occupational Therapy Evaluation Patient Details Name: [name] MRN: [id number] DOB: 1936-02-13 Today's Date: 04/09/2022   History of Present Illness The patient is a 86 year old female with history of diabetes mellitus type 2, complete heart block status post pacemaker, chronic hypoxic respiratory failure on home oxygen at 3 L/min, COPD, unspecified CVA, hypertension, hyperlipidemia, thrombocytopenia, chronic wounds including left metatarsal head osteomyelitis. She was found to have a UTI. SShe was also found to have T12 vertebral fracture.  Surgery felt that she has a T12 osteoporotic compression fracture with associated pain with no retropulsion or canal stenosis.  There is nothing to suggest osteomyelitis or discitis and they are recommending her be treated with bracing for pain control   Clinical Impression   [name] an 86 year old woman who presents with memory deficits, generalized weakness, decreased activity tolerance, impaired balance and pain. Patient typically lives alone and has a caregiver to help her. Unsure of how often the caregiver is there - when asked patient becomes irritated and says "she isn't going to be back." Then states the caregiver helps with groceries, cooking and cleaning. She reports she can typically do her own ADLs. Patient is alert to self but not to month or year. When asked for details she becomes irritable and doesn't answer the question. She does report falls. On evaluation she is mod assist for supine to sit, mod assist x 2 to stand with feet blocked and only able to take a couple of steps before exclaiming "I can't breathe!' She was seated in the recliner and o2 sat was 97%. HR was 128 though. Patient max-total assist for LB ADLs. Patient currently does not have the physical abilities to return home without 24/7 assistance. Patient is not agreeable to rehab stating "I'd rather be dead" but return home is NOT a safe option at this time. Also patient  has memory deficits - she repeats herself, not oriented to time, and cannot remember that it is close to Christmas despite being told twice during evaluation. Patient will benefit from skilled OT services while in hospital to improve deficits and learn compensatory strategies as needed in order to improve functional abilities.      Recommendations for follow up therapy are one component of a multi-disciplinary discharge planning process, led by the attending physician.  Recommendations may be updated based on patient status, additional functional criteria and insurance authorization.   Follow Up Recommendations  Skilled nursing-short term rehab (<3 hours/day)     Assistance Recommended at Discharge Frequent or constant Supervision/Assistance  Patient can return home with the following Two people to help with walking and/or transfers;A lot of help with bathing/dressing/bathroom;Assistance with cooking/housework;Direct supervision/assist for financial management;Assist for transportation;Help with stairs or ramp for entrance;Direct supervision/assist for medications management    Functional Status Assessment  Patient has had a recent decline in their functional status and demonstrates the ability to make significant improvements in function in a reasonable and predictable amount of time.  Equipment Recommendations  Other (comment) (TBD)    Recommendations for Other Services       Precautions / Restrictions Precautions Precautions: Back Precaution Comments: No formal back precautions Restrictions Weight Bearing Restrictions: No Other Position/Activity Restrictions: No brace ordered      Mobility Bed Mobility Overal bed mobility: Needs Assistance Bed Mobility: Supine to Sit     Supine to sit: Mod assist     General bed mobility comments: Mod assist mostly for trunk lift and verbal cues for log roll technique  Transfers Overall transfer level: Needs assistance Equipment used:  Rolling walker (2 wheels) Transfers: Sit to/from Stand Sit to Stand: Mod assist, +2 physical assistance, From elevated surface           General transfer comment: Mod x 2 to power up and then min assist to take a couple of steps with walker. Patient became anxious stating "I can't breathe, I can't breathe" and placed in recliner. O2 at 97% on RA.      Balance Overall balance assessment: Needs assistance Sitting-balance support: No upper extremity supported, Feet supported Sitting balance-Leahy Scale: Fair     Standing balance support: During functional activity, Reliant on assistive device for balance Standing balance-Leahy Scale: Poor Standing balance comment: Reliant on therapist                           ADL either performed or assessed with clinical judgement   ADL Overall ADL's : Needs assistance/impaired Eating/Feeding: Independent   Grooming: Set up;Sitting;Wash/dry face   Upper Body Bathing: Set up;Sitting   Lower Body Bathing: Moderate assistance;Sit to/from stand   Upper Body Dressing : Set up;Sitting   Lower Body Dressing: Maximal assistance;Sit to/from stand   Toilet Transfer: Moderate assistance;+2 for physical assistance;BSC/3in1;Rolling walker (2 wheels) Toilet Transfer Details (indicate cue type and reason): Mod x 2  to power up Toileting- Water quality scientist and Hygiene: Total assistance;Sit to/from stand Toileting - Clothing Manipulation Details (indicate cue type and reason): currently has a catheter, total assist for perianal care     Functional mobility during ADLs: Moderate assistance;+2 for physical assistance;Rolling walker (2 wheels)       Vision Patient Visual Report: No change from baseline       Perception     Praxis      Pertinent Vitals/Pain Pain Assessment Pain Assessment: Faces Faces Pain Scale: Hurts little more Pain Location: back - with sitting Pain Descriptors / Indicators: Guarding, Discomfort,  Grimacing Pain Intervention(s): Monitored during session     Hand Dominance Right   Extremity/Trunk Assessment Upper Extremity Assessment Upper Extremity Assessment: Overall WFL for tasks assessed   Lower Extremity Assessment Lower Extremity Assessment: Defer to PT evaluation   Cervical / Trunk Assessment Cervical / Trunk Assessment: Kyphotic   Communication Communication Communication: No difficulties   Cognition Arousal/Alertness: Awake/alert Behavior During Therapy: WFL for tasks assessed/performed Overall Cognitive Status: No family/caregiver present to determine baseline cognitive functioning                                 General Comments: Alert to self, birthday, Hospital. Doesn't know the year, month or situation. Recent timeline runs together. Covers with standard or broad statements. Short term memory loss- can't remember the Month despite being told and repeating herself     General Comments       Exercises     Shoulder Instructions      Home Living Family/patient expects to be discharged to:: Unsure Living Arrangements: Alone;Other (Comment)                               Additional Comments: likely needs more frequent assistance due to cognitive deficits and frequent falls      Prior Functioning/Environment Prior Level of Function : Needs assist;Patient poor historian/Family not available  Mobility Comments: pt ambulates with use of RW in the home ADLs Comments: pt reports completing most ADLs, however at present, she is confused with very poor short term memory. She states that caregiver helps with grocery shopping, cleaning and laundry. Says she comes every day.        OT Problem List: Decreased activity tolerance;Decreased knowledge of use of DME or AE;Decreased safety awareness;Decreased cognition;Impaired balance (sitting and/or standing);Pain;Cardiopulmonary status limiting activity;Decreased knowledge of  precautions      OT Treatment/Interventions: Self-care/ADL training;Therapeutic exercise;DME and/or AE instruction;Therapeutic activities;Cognitive remediation/compensation;Patient/family education;Balance training    OT Goals(Current goals can be found in the care plan section) Acute Rehab OT Goals OT Goal Formulation: Patient unable to participate in goal setting Time For Goal Achievement: 04/23/22 Potential to Achieve Goals: Fair  OT Frequency: Min 2X/week    Co-evaluation              AM-PAC OT "6 Clicks" Daily Activity     Outcome Measure Help from another person eating meals?: None Help from another person taking care of personal grooming?: A Little Help from another person toileting, which includes using toliet, bedpan, or urinal?: Total Help from another person bathing (including washing, rinsing, drying)?: A Lot Help from another person to put on and taking off regular upper body clothing?: A Little Help from another person to put on and taking off regular lower body clothing?: Total 6 Click Score: 14   End of Session Equipment Utilized During Treatment: Rolling walker (2 wheels) Nurse Communication: Mobility status  Activity Tolerance: Patient limited by fatigue;Patient limited by pain Patient left: in chair;with call bell/phone within reach;with chair alarm set  OT Visit Diagnosis: Muscle weakness (generalized) (M62.81);Pain;Other symptoms and signs involving cognitive function                Time: 1829-9371 OT Time Calculation (min): 31 min Charges:  OT General Charges $OT Visit: 1 Visit OT Evaluation $OT Eval Low Complexity: 1 Low  Gustavo Lah, OTR/L [place]  Office [phone]   Lenward Chancellor 04/09/2022, 4:14 PM

## 2022-04-10 DIAGNOSIS — Z515 Encounter for palliative care: Secondary | ICD-10-CM | POA: Diagnosis not present

## 2022-04-10 DIAGNOSIS — A419 Sepsis, unspecified organism: Secondary | ICD-10-CM | POA: Diagnosis not present

## 2022-04-10 DIAGNOSIS — R338 Other retention of urine: Secondary | ICD-10-CM | POA: Diagnosis not present

## 2022-04-10 DIAGNOSIS — G9341 Metabolic encephalopathy: Secondary | ICD-10-CM | POA: Diagnosis not present

## 2022-04-10 DIAGNOSIS — N3000 Acute cystitis without hematuria: Secondary | ICD-10-CM | POA: Diagnosis not present

## 2022-04-10 DIAGNOSIS — Z7189 Other specified counseling: Secondary | ICD-10-CM | POA: Diagnosis not present

## 2022-04-10 LAB — CBC WITH DIFFERENTIAL/PLATELET
Abs Immature Granulocytes: 0.03 10*3/uL (ref 0.00–0.07)
Basophils Absolute: 0.1 10*3/uL (ref 0.0–0.1)
Basophils Relative: 1 %
Eosinophils Absolute: 0.1 10*3/uL (ref 0.0–0.5)
Eosinophils Relative: 2 %
HCT: 38.4 % (ref 36.0–46.0)
Hemoglobin: 12.1 g/dL (ref 12.0–15.0)
Immature Granulocytes: 1 %
Lymphocytes Relative: 33 %
Lymphs Abs: 1.8 10*3/uL (ref 0.7–4.0)
MCH: 27.3 pg (ref 26.0–34.0)
MCHC: 31.5 g/dL (ref 30.0–36.0)
MCV: 86.5 fL (ref 80.0–100.0)
Monocytes Absolute: 0.6 10*3/uL (ref 0.1–1.0)
Monocytes Relative: 10 %
Neutro Abs: 2.9 10*3/uL (ref 1.7–7.7)
Neutrophils Relative %: 53 %
Platelets: 199 10*3/uL (ref 150–400)
RBC: 4.44 MIL/uL (ref 3.87–5.11)
RDW: 14.6 % (ref 11.5–15.5)
WBC: 5.5 10*3/uL (ref 4.0–10.5)
nRBC: 0 % (ref 0.0–0.2)

## 2022-04-10 LAB — COMPREHENSIVE METABOLIC PANEL
ALT: 8 U/L (ref 0–44)
AST: 13 U/L — ABNORMAL LOW (ref 15–41)
Albumin: 2.1 g/dL — ABNORMAL LOW (ref 3.5–5.0)
Alkaline Phosphatase: 47 U/L (ref 38–126)
Anion gap: 7 (ref 5–15)
BUN: 17 mg/dL (ref 8–23)
CO2: 33 mmol/L — ABNORMAL HIGH (ref 22–32)
Calcium: 8.5 mg/dL — ABNORMAL LOW (ref 8.9–10.3)
Chloride: 94 mmol/L — ABNORMAL LOW (ref 98–111)
Creatinine, Ser: 0.63 mg/dL (ref 0.44–1.00)
GFR, Estimated: >60 mL/min (ref >60)
Glucose, Bld: 149 mg/dL — ABNORMAL HIGH (ref 70–99)
Potassium: 4.3 mmol/L (ref 3.5–5.1)
Sodium: 134 mmol/L — ABNORMAL LOW (ref 135–145)
Total Bilirubin: 0.3 mg/dL (ref 0.3–1.2)
Total Protein: 5.9 g/dL — ABNORMAL LOW (ref 6.5–8.1)

## 2022-04-10 LAB — GLUCOSE, CAPILLARY
Glucose-Capillary: 144 mg/dL — ABNORMAL HIGH (ref 70–99)
Glucose-Capillary: 187 mg/dL — ABNORMAL HIGH (ref 70–99)
Glucose-Capillary: 205 mg/dL — ABNORMAL HIGH (ref 70–99)
Glucose-Capillary: 254 mg/dL — ABNORMAL HIGH (ref 70–99)
Glucose-Capillary: 256 mg/dL — ABNORMAL HIGH (ref 70–99)
Glucose-Capillary: 348 mg/dL — ABNORMAL HIGH (ref 70–99)

## 2022-04-10 LAB — CULTURE, BLOOD (ROUTINE X 2)
Culture: NO GROWTH
Culture: NO GROWTH

## 2022-04-10 LAB — PHOSPHORUS: Phosphorus: 3.2 mg/dL (ref 2.5–4.6)

## 2022-04-10 LAB — MAGNESIUM: Magnesium: 1.7 mg/dL (ref 1.7–2.4)

## 2022-04-10 MED ORDER — ALBUTEROL SULFATE (2.5 MG/3ML) 0.083% IN NEBU
2.5000 mg | INHALATION_SOLUTION | Freq: Four times a day (QID) | RESPIRATORY_TRACT | Status: DC | PRN
  Administered 2022-04-10 – 2022-04-25 (×4): 2.5 mg via RESPIRATORY_TRACT
  Filled 2022-04-10 (×5): qty 3

## 2022-04-10 MED ORDER — INSULIN GLARGINE-YFGN 100 UNIT/ML ~~LOC~~ SOLN
15.0000 [IU] | Freq: Every day | SUBCUTANEOUS | Status: DC
  Administered 2022-04-10 – 2022-04-17 (×8): 15 [IU] via SUBCUTANEOUS
  Filled 2022-04-10 (×9): qty 0.15

## 2022-04-10 NOTE — Inpatient Diabetes Management (Signed)
Inpatient Diabetes Program Recommendations  AACE/ADA: New Consensus Statement on Inpatient Glycemic Control (2015)  Target Ranges:  Prepandial:   less than 140 mg/dL      Peak postprandial:   less than 180 mg/dL (1-2 hours)      Critically ill patients:  140 - 180 mg/dL   Lab Results  Component Value Date   GLUCAP 205 (H) 04/10/2022   HGBA1C 12.9 (H) 04/05/2022    Review of Glycemic Control   Latest Reference Range & Units 04/09/22 20:09 04/09/22 23:59 04/10/22 04:30 04/10/22 07:30 04/10/22 11:43  Glucose-Capillary 70 - 99 mg/dL 339 (H) 254 (H) 187 (H) 144 (H) 205 (H)  (H): Data is abnormally high Diabetes history: DM2 Outpatient Diabetes medications: Tresiba 26 QHS, Novolog 5 units TID with meals Current orders for Inpatient glycemic control: Semglee 10 QHS, Novolog 0-9 units Q4H   HgbA1C - 12.9%   Inpatient Diabetes Program Recommendations:     Increase Semglee to 15 units QHS   Thanks, Bronson Curb, MSN, RNC-OB Diabetes Coordinator [phone] (8a-5p)

## 2022-04-10 NOTE — Progress Notes (Signed)
PROGRESS NOTE    [name]  KCL:[id number] DOB: [birth year] DOA: 04/04/2022 PCP: Wardell Honour, MD   Brief Narrative:  The patient is a 86 year old female with history of diabetes mellitus type 2, complete heart block status post pacemaker, chronic hypoxic respiratory failure on home oxygen at 3 L/min, COPD, unspecified CVA, hypertension, hyperlipidemia, thrombocytopenia, chronic wounds including left metatarsal head osteomyelitis followed by Dr. Drucilla Schmidt on doxycycline and cefadroxil presented with altered mental status. On presentation, she was hypoglycemic and found to have UTI. She was started on IV fluids and broad-spectrum antibiotics. She was found to have polymicrobial bacteremia: ID was consulted; antibiotics switched to Unasyn. She was also found to have T12 vertebral fracture.    Patient does not want undergo surgical intervention #2 also ID is now recommended changing her to p.o. Augmentin for at least 4 weeks.  ID also feels that she does not need a TEE.  Palliative care discussion continue be had.  PT OT recommending SNF but patient does not want to go to SNF and wants to go home.   Assessment and Plan: Polymicrobial Bacteremia Sepsis: Present on Admisison -Blood cultures on admission positive for Enterococcus faecalis and Rothia. questionable cause.   -ID following.  Repeat blood cultures from 04/06/2022 are negative so far.  Currently on Unasyn as per ID. -2D echo was poor study which showed EF of 45 to 50% but no obvious vegetation. If the patient has persistent bacteremia, might need TEE -Per ID we will wait for repeat blood cultures 72 hours after to decide whether TEE is indicated especially due to low suspicion of endocarditis with polymicrobial growth we will bacteremia.  Repeat blood cultures on 04/06/2022 showed no growth to date at 3 days and will defer to ID about TEE and ID feels that she does not need a TEE -ID recommends that she will need surgical  intervention to cure the chronic left foot osteomyelitis which is the source of her polymicrobial bacteremia and she has refused surgical intervention for now.  ID recommends not obtaining TEE and recommend switching her antibiotics to amoxicillin clavulanic acid twice daily dosing for 3 additional weeks with seen by Dr. Drucilla Schmidt on 04/26/2022   UTI/Acute cystitis without hematuria -Urine cultures negative so far.  Antibiotics as above   Acute metabolic encephalopathy -Possibly from above.  Mental status improving.   -Still slow to respond and slightly confused to time.  Fall precautions.  Monitor mental status.  PT eval recommending SNF   Acute Urinary Retention -Currently has a Foley catheter.  Start Flomax.   -Will give voiding trial in the next few days and likely try tomorrow -Flush foley    T12 vertebral fracture -Cannot have the MRI because of pacemaker.   -CT shows acute T12 vertebral compression fracture. -Neurosurgery consulted as per ID recommendations.  Surgery feels that she has a T12 osteoporotic compression fracture with associated pain with no retropulsion or canal stenosis.  There is nothing to suggest osteomyelitis or discitis and they are recommending her be treated with bracing for pain control and given her osteomyelitis and bacteremia and history of sacral decubitus ulcer she is not a candidate for any type of intervention -PT/OT Eval recommending SNF but patient wants to go home with home health and does not however SNF   Left Foot Lateral  osteomyelitis Left Lateral Foot Callus -Patient has received more than 7 weeks of oral doxycycline and cefadroxil. -Dr. Starla Link had consulted podiatry as per ID recommendations to see if  the left lateral foot callus needs any further investigation or treatment. -Infectious diseases feels that she needs no further antibiotics for the right foot given that she is getting 7+ weeks of doxycycline and then subsequently cefadroxil since  03/14/2020 -The podiatry team has evaluated her and patient continues to refuse her MRI as well as amputation and therefore they believe that she would benefit from long-term IV antibiotic management and remains on IV Unasyn -Podiatry feels that she is at high risk of losing her digit versus her foot versus the leg and they are recommending continuing local wound care with Betadine wet-to-dry dressings -PT/OT evaluating and recommending SNF patient does not want to go to SNF and wants to go home with home health and will need to figure out a safe discharge disposition and TOC involved   Leukocytosis -Resolved and WBC is 5.5 and improved  -Continue to Monitor and Trend -Repeat CBC in the AM    Hyponatremia -Mild and improved as sodium has gone from 134 -> 136 -> 133 -> 134 -Continue monitor and trend and repeat CMP in a.m.   Hypomagnesemia -Replete with IV mag sulfate 4 g total yesterday -Mag Level is now 1.7 and will replete with IV mag sulfate 2 g -Continue to monitor and trend and repeat CMP in a.m.   Severe protein calorie malnutrition -Nutrition Status: Nutrition Problem: Severe Malnutrition Etiology: chronic illness Signs/Symptoms: severe fat depletion, severe muscle depletion, percent weight loss Percent weight loss: 31 % (in 1 year) Interventions: Refer to RD note for recommendations, Premier Protein, Glucerna shake    Pressure Ulcers -Various wounds: I have personally evaluated and agree with the documentation of the wounds as below Pressure Injury 01/11/22 Sacrum Mid;Medial Stage 1 -  Intact skin with non-blanchable redness of a localized area usually over a bony prominence. (Active)  01/11/22 0226  Location: Sacrum  Location Orientation: Mid;Medial  Staging: Stage 1 -  Intact skin with non-blanchable redness of a localized area usually over a bony prominence.  Wound Description (Comments):   Present on Admission: Yes     Pressure Injury 04/05/22 Hip  Anterior;Left;Proximal Stage 2 -  Partial thickness loss of dermis presenting as a shallow open injury with a red, pink wound bed without slough. Round 1cmx1cm pressure sore, yellow center with pink edges, dry wound bed (Active)  04/05/22 0000  Location: Hip  Location Orientation: Anterior;Left;Proximal  Staging: Stage 2 -  Partial thickness loss of dermis presenting as a shallow open injury with a red, pink wound bed without slough.  Wound Description (Comments): Round 1cmx1cm pressure sore, yellow center with pink edges, dry wound bed  Present on Admission: Yes     Pressure Injury 04/05/22 Toe (Comment  which one) Left;Lateral Unstageable - Full thickness tissue loss in which the base of the injury is covered by slough (yellow, tan, gray, green or brown) and/or eschar (tan, brown or black) in the wound bed. 2cmx1cm (Active)  04/05/22 0000  Location: Toe (Comment  which one) (outer aspect of pinky toe)  Location Orientation: Left;Lateral  Staging: Unstageable - Full thickness tissue loss in which the base of the injury is covered by slough (yellow, tan, gray, green or brown) and/or eschar (tan, brown or black) in the wound bed.  Wound Description (Comments): 2cmx1cm oval shaped wound, covered with grey/brown scabbing which has hair/fuzz? imbedded in it, dry with no drainage  Present on Admission: Yes  -C/w WOC Consult   History of Complete Heart Block status post Pacemaker -Outpatient follow-up  with cardiology/EP   COPD Chronic respiratory failure with hypoxia -Currently stable -SpO2: 94 % O2 Flow Rate (L/min): 2 L/min FiO2 (%): 28 % -Continue current nebs   Diabetes mellitus type II with Hyperglycemia -Continue CBGs with SSI.  A1c 12.9 -Increase Semglee to 15 units nightly and added 3 units of NovoLog 3 times daily with meals and she needs further blood pressure control and continuing NovoLog 0 to 9 units every 4h -CBGs ranging from 144-348   Hypoalbuminemia -The patient's albumin  level is now 2.2 -> 2.1 again -Cpntinue to monitor and trend and repeat CMP in a.m.   Abdominal pain with constipation -Has not had a bowel movement in several days -Check KUB and showed "Nonspecific bowel gas pattern. Moderate colonic stool burden, most prominent in the hepatic flexure." -Restart stool softeners with senna docusate 1 tab p.o. twice daily, MiraLAX 17 g p.o. twice daily, as well as bisacodyl 10 g rectal suppository as needed -Monitor Bowel Movements    Goals of care -Discussed with patient who is more awake and responsive and agrees for DNR.   -Consulted palliative care for goals of care discussion. -PT OT recommending SNF but patient does not want to go to SNF and wants to go home though home is not deemed a safe option.  Patient states that she is arranging caregivers for home and denies any other pain.  Palliative care recommends to continue to monitor her course and overall disease trajectory of illness and TOC recommendations.  DVT prophylaxis: heparin injection 5,000 Units Start: 04/05/22 0600 SCDs Start: 04/05/22 0018    Code Status: DNR Family Communication: No family at bedside  Disposition Plan:  Level of care: Med-Surg Status is: Inpatient Remains inpatient appropriate because: Needs further clinical improvement and safe discharge disposition.  PT OT recommending SNF but she wants to go home and will need to ensure that she has a safe discharge disposition with 24-hour caregivers   Consultants:  Podiatry Palliative Care Medicine ID Neurosurgery    Procedures:  As delineated as above  Antimicrobials:  Anti-infectives (From admission, onward)    Start     Dose/Rate Route Frequency Ordered Stop   04/05/22 1700  Ampicillin-Sulbactam (UNASYN) 3 g in sodium chloride 0.9 % 100 mL IVPB        3 g 200 mL/hr over 30 Minutes Intravenous Every 6 hours 04/05/22 1643     04/05/22 0800  cefTRIAXone (ROCEPHIN) 1 g in sodium chloride 0.9 % 100 mL IVPB  Status:   Discontinued        1 g 200 mL/hr over 30 Minutes Intravenous Every 24 hours 04/05/22 0017 04/05/22 1643   04/04/22 2145  cefTRIAXone (ROCEPHIN) 1 g in sodium chloride 0.9 % 100 mL IVPB        1 g 200 mL/hr over 30 Minutes Intravenous  Once 04/04/22 2135 04/04/22 2240       Subjective: Seen and examined at bedside and states that she is doing okay and refusing surgical intervention and refusing back brace.  States that she does not want to go to SNF even though it was recommended and wants to go home with home health.  Awaiting ID recommendations and ID supposed to change her antibiotics.  She denies any lightheadedness or dizziness.  No other concerns or complaints at this time.  Objective: Vitals:   04/10/22 0726 04/10/22 0729 04/10/22 1331 04/10/22 1519  BP:    118/61  Pulse:    (!) 107  Resp:    Marland Kitchen)  28  Temp:    98.3 F (36.8 C)  TempSrc:    Oral  SpO2: 93% 93% 94% 94%  Weight:      Height:        Intake/Output Summary (Last 24 hours) at 04/10/2022 1952 Last data filed at 04/10/2022 1824 Gross per 24 hour  Intake 200 ml  Output 2475 ml  Net -2275 ml   Filed Weights   04/05/22 0415 04/09/22 0347 04/10/22 0500  Weight: 47.5 kg 52.2 kg 51.3 kg   Examination: Physical Exam:  Constitutional: Thin frail elderly chronically ill-appearing Caucasian female in no acute distress appears fatigued Respiratory: Diminished to auscultation bilaterally with coarse breath sounds, no wheezing, rales, rhonchi or crackles. Normal respiratory effort and patient is not tachypenic. No accessory muscle use.  Unlabored breathing Cardiovascular: RRR, no murmurs / rubs / gallops. S1 and S2 auscultated.  Abdomen: Soft, non-tender, non-distended. Bowel sounds positive.  GU: Deferred. Musculoskeletal: No clubbing / cyanosis of digits/nails. No joint deformity upper and lower extremities..  Skin: Ulcers noted Neurologic: CN 2-12 grossly intact with no focal deficits. Romberg sign cerebellar reflexes  not assessed.  Psychiatric: Normal judgment and insight. Alert and oriented x 3.  Anxious mood and appropriate affect.   Data Reviewed: I have personally reviewed following labs and imaging studies  CBC: Recent Labs  Lab 04/05/22 0040 04/06/22 0402 04/07/22 0358 04/08/22 0403 04/09/22 0322 04/10/22 0320  WBC 11.8* 6.8 7.7 6.6 6.3 5.5  NEUTROABS 9.4*  --   --  4.1 3.7 2.9  HGB 14.2 12.9 13.0 13.5 12.2 12.1  HCT 46.4* 41.2 41.9 42.3 38.0 38.4  MCV 88.0 86.6 86.4 86.5 85.8 86.5  PLT 246 197 194 176 192 235   Basic Metabolic Panel: Recent Labs  Lab 04/05/22 0040 04/06/22 0402 04/07/22 0358 04/08/22 0403 04/09/22 0322 04/10/22 0320  NA 135 135 134* 136 133* 134*  K 4.7 3.9 3.9 4.0 4.4 4.3  CL 98 98 96* 94* 93* 94*  CO2 25 28 32 35* 33* 33*  GLUCOSE 391* 211* 166* 174* 254* 149*  BUN 31* 16 16 18 19 17   CREATININE 0.85 0.62 0.69 0.70 0.47 0.63  CALCIUM 9.0 8.5* 8.5* 9.0 8.7* 8.5*  MG 1.8  --   --  1.4* 1.8 1.7  PHOS  --   --   --   --  2.9 3.2   GFR: Estimated Creatinine Clearance: 42.4 mL/min (by C-G formula based on SCr of 0.63 mg/dL). Liver Function Tests: Recent Labs  Lab 04/04/22 1816 04/05/22 0040 04/08/22 0403 04/09/22 0322 04/10/22 0320  AST 11* 9* 13* 14* 13*  ALT 7 9 9 9 8   ALKPHOS 59 63 55 54 47  BILITOT 0.8 0.8 0.2* 0.4 0.3  PROT 6.3* 6.9 6.1* 6.1* 5.9*  ALBUMIN 2.2* 2.5* 2.2* 2.1* 2.1*   Recent Labs  Lab 04/04/22 1816  LIPASE 25   No results for input(s): "AMMONIA" in the last 168 hours. Coagulation Profile: No results for input(s): "INR", "PROTIME" in the last 168 hours. Cardiac Enzymes: No results for input(s): "CKTOTAL", "CKMB", "CKMBINDEX", "TROPONINI" in the last 168 hours. BNP (last 3 results) No results for input(s): "PROBNP" in the last 8760 hours. HbA1C: No results for input(s): "HGBA1C" in the last 72 hours. CBG: Recent Labs  Lab 04/09/22 2359 04/10/22 0430 04/10/22 0730 04/10/22 1143 04/10/22 1707  GLUCAP 254* 187* 144*  205* 348*   Lipid Profile: No results for input(s): "CHOL", "HDL", "LDLCALC", "TRIG", "CHOLHDL", "LDLDIRECT" in the last 72  hours. Thyroid Function Tests: No results for input(s): "TSH", "T4TOTAL", "FREET4", "T3FREE", "THYROIDAB" in the last 72 hours. Anemia Panel: No results for input(s): "VITAMINB12", "FOLATE", "FERRITIN", "TIBC", "IRON", "RETICCTPCT" in the last 72 hours. Sepsis Labs: Recent Labs  Lab 04/04/22 1816 04/05/22 0040  LATICACIDVEN 2.0* 1.9    Recent Results (from the past 240 hour(s))  Urine Culture     Status: None   Collection Time: 04/04/22  1:36 PM   Specimen: Urine, Clean Catch  Result Value Ref Range Status   Specimen Description   Final    URINE, CLEAN CATCH Performed at [facility], [place] [address] [place], [place] [address]    Special Requests   Final    NONE Performed at [facility], [place] [address], [place], [place] [address]    Culture   Final    NO GROWTH Performed at [facility], [place] [address], [place], [place] [address]    Report Status 04/05/2022 FINAL  Final  Culture, blood (single)     Status: Abnormal   Collection Time: 04/04/22  6:16 PM   Specimen: BLOOD  Result Value Ref Range Status   Specimen Description   Final    BLOOD SITE NOT SPECIFIED Performed at [place] [address], [place], [place] [address]    Special Requests   Final    BOTTLES DRAWN AEROBIC AND ANAEROBIC Blood Culture results may not be optimal due to an inadequate volume of blood received in culture bottles Performed at [place] [address] [place], [place] [address]    Culture  Setup Time   Final    GRAM POSITIVE COCCI IN CHAINS ANAEROBIC BOTTLE ONLY CRITICAL RESULT CALLED TO, READ BACK BY AND VERIFIED WITH: PHARMD C. SHADE [id number] @1607  FH GRAM POSITIVE COCCI IN CLUSTERS BOTTLES DRAWN AEROBIC ONLY CRITICAL RESULT CALLED TO, READ BACK BY AND VERIFIED WITH: PHARMD  [place] 04/06/22 @ 0707 BY AB    Culture (A)  Final    ENTEROCOCCUS FAECALIS ROTHIA MUCILAGINOSA Standardized susceptibility testing for this organism is not available. Performed at [facility], [place] [address] [place],  [address]    Report Status 04/07/2022 FINAL  Final   Organism ID, Bacteria ENTEROCOCCUS FAECALIS  Final      Susceptibility   Enterococcus faecalis - MIC*    AMPICILLIN <=2 SENSITIVE Sensitive     VANCOMYCIN 2 SENSITIVE Sensitive     GENTAMICIN SYNERGY SENSITIVE Sensitive     * ENTEROCOCCUS FAECALIS  Blood Culture ID Panel (Reflexed)     Status: Abnormal   Collection Time: 04/04/22  6:16 PM  Result Value Ref Range Status   Enterococcus faecalis DETECTED (A) NOT DETECTED Final    Comment: CRITICAL RESULT CALLED TO, READ BACK BY AND VERIFIED WITH: PHARMD C. SHADE [id number] @1607  FH    Enterococcus Faecium NOT DETECTED NOT DETECTED Final   Listeria monocytogenes NOT DETECTED NOT DETECTED Final   Staphylococcus species NOT DETECTED NOT DETECTED Final   Staphylococcus aureus (BCID) NOT DETECTED NOT DETECTED Final   Staphylococcus epidermidis NOT DETECTED NOT DETECTED Final   Staphylococcus lugdunensis NOT DETECTED NOT DETECTED Final   Streptococcus species DETECTED (A) NOT DETECTED Final    Comment: Not Enterococcus species, Streptococcus agalactiae, Streptococcus pyogenes, or Streptococcus pneumoniae. CRITICAL RESULT CALLED TO, READ BACK BY AND VERIFIED WITH: PHARMD C. SHADE [id number] @1607  FH    Streptococcus agalactiae NOT DETECTED NOT DETECTED Final   Streptococcus pneumoniae NOT  DETECTED NOT DETECTED Final   Streptococcus pyogenes NOT DETECTED NOT DETECTED Final   A.calcoaceticus-baumannii NOT DETECTED NOT DETECTED Final   Bacteroides fragilis NOT DETECTED NOT DETECTED Final   Enterobacterales NOT DETECTED NOT DETECTED Final   Enterobacter cloacae complex NOT DETECTED NOT DETECTED Final   Escherichia coli NOT DETECTED NOT DETECTED Final   Klebsiella  aerogenes NOT DETECTED NOT DETECTED Final   Klebsiella oxytoca NOT DETECTED NOT DETECTED Final   Klebsiella pneumoniae NOT DETECTED NOT DETECTED Final   Proteus species NOT DETECTED NOT DETECTED Final   Salmonella species NOT DETECTED NOT DETECTED Final   Serratia marcescens NOT DETECTED NOT DETECTED Final   Haemophilus influenzae NOT DETECTED NOT DETECTED Final   Neisseria meningitidis NOT DETECTED NOT DETECTED Final   Pseudomonas aeruginosa NOT DETECTED NOT DETECTED Final   Stenotrophomonas maltophilia NOT DETECTED NOT DETECTED Final   Candida albicans NOT DETECTED NOT DETECTED Final   Candida auris NOT DETECTED NOT DETECTED Final   Candida glabrata NOT DETECTED NOT DETECTED Final   Candida krusei NOT DETECTED NOT DETECTED Final   Candida parapsilosis NOT DETECTED NOT DETECTED Final   Candida tropicalis NOT DETECTED NOT DETECTED Final   Cryptococcus neoformans/gattii NOT DETECTED NOT DETECTED Final   Vancomycin resistance NOT DETECTED NOT DETECTED Final    Comment: Performed at [facility], [address] [place], [place] [address]  Blood culture (routine x 2)     Status: None   Collection Time: 04/04/22  9:36 PM   Specimen: BLOOD  Result Value Ref Range Status   Specimen Description   Final    BLOOD RIGHT ANTECUBITAL Performed at [place] [address] [place], [place] [address]    Special Requests   Final    BOTTLES DRAWN AEROBIC ONLY Blood Culture results may not be optimal due to an inadequate volume of blood received in culture bottles Performed at [place] [address] [place], [place] [address]    Culture   Final    NO GROWTH 5 DAYS Performed at [facility], [place] [address], [place], [place] [address]    Report Status 04/10/2022 FINAL  Final  Blood culture (routine x 2)     Status: None   Collection Time: 04/04/22  9:41 PM   Specimen: BLOOD  Result Value Ref Range Status   Specimen Description   Final     BLOOD LEFT ANTECUBITAL Performed at [place] [address], [place], [place] [address]    Special Requests   Final    BOTTLES DRAWN AEROBIC ONLY Blood Culture results may not be optimal due to an inadequate volume of blood received in culture bottles Performed at [place] [address], [place], [place] [address]    Culture   Final    NO GROWTH 5 DAYS Performed at [facility], [place] [address] [place],  [address]    Report Status 04/10/2022 FINAL  Final  Blood Culture ID Panel (Reflexed)     Status: None   Collection Time: 04/06/22  5:43 AM  Result Value Ref Range Status   Enterococcus faecalis NOT DETECTED NOT DETECTED Final   Enterococcus Faecium NOT DETECTED NOT DETECTED Final   Listeria monocytogenes NOT DETECTED NOT DETECTED Final   Staphylococcus species NOT DETECTED NOT DETECTED Final   Staphylococcus aureus (BCID) NOT DETECTED NOT DETECTED Final   Staphylococcus epidermidis NOT DETECTED NOT DETECTED Final   Staphylococcus lugdunensis NOT  DETECTED NOT DETECTED Final   Streptococcus species NOT DETECTED NOT DETECTED Final   Streptococcus agalactiae NOT DETECTED NOT DETECTED Final   Streptococcus pneumoniae NOT DETECTED NOT DETECTED Final   Streptococcus pyogenes NOT DETECTED NOT DETECTED Final   A.calcoaceticus-baumannii NOT DETECTED NOT DETECTED Final   Bacteroides fragilis NOT DETECTED NOT DETECTED Final   Enterobacterales NOT DETECTED NOT DETECTED Final   Enterobacter cloacae complex NOT DETECTED NOT DETECTED Final   Escherichia coli NOT DETECTED NOT DETECTED Final   Klebsiella aerogenes NOT DETECTED NOT DETECTED Final   Klebsiella oxytoca NOT DETECTED NOT DETECTED Final   Klebsiella pneumoniae NOT DETECTED NOT DETECTED Final   Proteus species NOT DETECTED NOT DETECTED Final   Salmonella species NOT DETECTED NOT DETECTED Final   Serratia marcescens NOT DETECTED NOT DETECTED Final   Haemophilus influenzae  NOT DETECTED NOT DETECTED Final   Neisseria meningitidis NOT DETECTED NOT DETECTED Final   Pseudomonas aeruginosa NOT DETECTED NOT DETECTED Final   Stenotrophomonas maltophilia NOT DETECTED NOT DETECTED Final   Candida albicans NOT DETECTED NOT DETECTED Final   Candida auris NOT DETECTED NOT DETECTED Final   Candida glabrata NOT DETECTED NOT DETECTED Final   Candida krusei NOT DETECTED NOT DETECTED Final   Candida parapsilosis NOT DETECTED NOT DETECTED Final   Candida tropicalis NOT DETECTED NOT DETECTED Final   Cryptococcus neoformans/gattii NOT DETECTED NOT DETECTED Final    Comment: Performed at [facility], [address], [place], [place] [address]  Culture, blood (Routine X 2) w Reflex to ID Panel     Status: None (Preliminary result)   Collection Time: 04/06/22 12:02 PM   Specimen: BLOOD LEFT HAND  Result Value Ref Range Status   Specimen Description   Final    BLOOD LEFT HAND Performed at [place] [address], [place], [place] [address]    Special Requests   Final    IN PEDIATRIC BOTTLE Blood Culture adequate volume Performed at [place] [address] [place], [place] [address]    Culture   Final    NO GROWTH 4 DAYS Performed at [facility], [place] [address], [place], [place] [address]    Report Status PENDING  Incomplete  Culture, blood (Routine X 2) w Reflex to ID Panel     Status: None (Preliminary result)   Collection Time: 04/06/22 12:02 PM   Specimen: BLOOD LEFT ARM  Result Value Ref Range Status   Specimen Description   Final    BLOOD LEFT ARM Performed at [place] [address] [place], [place] [address]    Special Requests   Final    IN PEDIATRIC BOTTLE Blood Culture adequate volume Performed at [place] [address], [place], [place] [address]    Culture   Final    NO GROWTH 4 DAYS Performed at [facility], [place] [name],  [place],  [address]    Report Status PENDING  Incomplete    Radiology Studies: No results found.  Scheduled Meds:  budesonide (PULMICORT) nebulizer solution  0.25 mg Nebulization BID   Chlorhexidine Gluconate Cloth  6 each Topical Daily   feeding supplement (GLUCERNA SHAKE)  237 mL Oral BID BM   heparin  5,000 Units Subcutaneous Q8H   insulin aspart  0-9 Units Subcutaneous Q4H   insulin glargine-yfgn  10 Units Subcutaneous QHS   ipratropium-albuterol  3 mL Nebulization BID   leptospermum manuka honey  1  Application Topical Daily   lidocaine  1 patch Transdermal Q24H   polyethylene glycol  17 g Oral BID   Ensure Max Protein  11 oz Oral Daily   senna-docusate  1 tablet Oral BID   tamsulosin  0.4 mg Oral QPC supper   Continuous Infusions:  ampicillin-sulbactam (UNASYN) IV 3 g (04/10/22 1939)    LOS: 6 days   Raiford Noble, DO [facility] Available via Epic secure chat 7am-7pm After these hours, please refer to coverage provider listed on [email or web address] 04/10/2022, 7:52 PM

## 2022-04-10 NOTE — Care Management Important Message (Signed)
Important Message  Patient Details IM Letter placed in Patient's room. Name: [name] MRN: [id number] Date of Birth: October 28, 1935   Medicare Important Message Given:  Yes     Kerin Salen 04/10/2022, 10:00 AM

## 2022-04-10 NOTE — Progress Notes (Signed)
[facility] for Infectious Disease    Date of Admission:  04/04/2022   Total days of antibiotics 7/6 of amp/sub          ID: [name] is a 86 y.o. female with poorly controlled T2DM, PAD, left foot chronic osteo--admitted for headaches and feeling poorly-found to have leukocytosis of 18K but afebrile. 1 of 4 bottles showing enterococcus species/rothia Principal Problem:   Acute cystitis without hematuria Active Problems:   Uncontrolled type 2 diabetes mellitus with hyperglycemia (HCC)   Chronic respiratory failure with hypoxia (HCC) - home O2 of 2-3 L/min   Acute metabolic encephalopathy   Pacemaker   Sepsis without acute organ dysfunction (HCC)   Protein-calorie malnutrition, severe   Acute urinary retention   T12 vertebral fracture (HCC)    Subjective: Afebrile. No headache. Still has back pain(from fracture) did not want any surgery by podiatry, nor furhter imaging per dr Alfredia Ferguson.  Medications:   budesonide (PULMICORT) nebulizer solution  0.25 mg Nebulization BID   Chlorhexidine Gluconate Cloth  6 each Topical Daily   feeding supplement (GLUCERNA SHAKE)  237 mL Oral BID BM   heparin  5,000 Units Subcutaneous Q8H   insulin aspart  0-9 Units Subcutaneous Q4H   insulin glargine-yfgn  10 Units Subcutaneous QHS   ipratropium-albuterol  3 mL Nebulization BID   leptospermum manuka honey  1 Application Topical Daily   lidocaine  1 patch Transdermal Q24H   polyethylene glycol  17 g Oral BID   Ensure Max Protein  11 oz Oral Daily   senna-docusate  1 tablet Oral BID   tamsulosin  0.4 mg Oral QPC supper    Objective: Vital signs in last 24 hours: Temp:  [99.3 F (37.4 C)-99.6 F (37.6 C)] 99.3 F (37.4 C) (12/04 0432) Pulse Rate:  [93-105] 93 (12/04 0432) Resp:  [15-16] 15 (12/04 0432) BP: (109-120)/(52-60) 120/60 (12/04 0432) SpO2:  [91 %-97 %] 94 % (12/04 1331) FiO2 (%):  [28 %] 28 % (12/04 1331) Weight:  [51.3 kg] 51.3 kg (12/04 0500)  Physical Exam   Constitutional:  oriented to person, place, and time. appears well-developed and well-nourished. No distress.  HENT: /AT, PERRLA, no scleral icterus Mouth/Throat: Oropharynx is clear and moist. No oropharyngeal exudate.  Cardiovascular: Normal rate, regular rhythm and normal heart sounds. Exam reveals no gallop and no friction rub.  No murmur heard.  Pulmonary/Chest: Effort normal and breath sounds normal. No respiratory distress.  has no wheezes.  Neck = supple, no nuchal rigidity Abdominal: Soft. Bowel sounds are normal.  exhibits no distension. There is no tenderness.  Lymphadenopathy: no cervical adenopathy. No axillary adenopathy Neurological: alert and oriented to person, place, and time.  Skin: Skin is warm and dry. No rash noted. No erythema. Shallow ulcer to left buttock, and discoloration and dFU to left lateral foot about 5th toe Psychiatric: a normal mood and affect.  behavior is normal.    Lab Results Recent Labs    04/09/22 0322 04/10/22 0320  WBC 6.3 5.5  HGB 12.2 12.1  HCT 38.0 38.4  NA 133* 134*  K 4.4 4.3  CL 93* 94*  CO2 33* 33*  BUN 19 17  CREATININE 0.47 0.63   Liver Panel Recent Labs    04/09/22 0322 04/10/22 0320  PROT 6.1* 5.9*  ALBUMIN 2.1* 2.1*  AST 14* 13*  ALT 9 8  ALKPHOS 54 47  BILITOT 0.4 0.3   Lab Results  Component Value Date   ESRSEDRATE 11  01/10/2022    C-Reactive Protein Recent Labs    04/08/22 0403  CRP 6.0*    Microbiology: Repeat blood cx NGTD on 11/30 Blood cx 1 of 4 bottles enterococcus on 11/28 Studies/Results: No results found.   Assessment/Plan: Polymicrobial bacteremia = likely due to left foot chronic osteomyelitis. She will likely need surgical intervention to truly cure this process. Initially was on doxy plus cefadroxil when she saw dr Lucianne Lei dam as an outpatient. She has refused surgical intervention for now.  Only in 1 of 4 bottles, with repeat blood cx ngtd. Will not recommend TEE.  Recommend to  switch to amox/clav bid dosing for an 3 additional week, then she will be seen by dr Lucianne Lei dam on 12/20 to decide to continue on this regimen  Left buttock pressure ulcer = continue with wound care and off loading  Severe protein-calorie malnutritoin = will need to do protein supplementation to help with wound healing.  Will sign off.  [facility] Pager: [phone]  04/10/2022, 2:45 PM

## 2022-04-10 NOTE — Progress Notes (Signed)
Daily Progress Note   Patient Name: [name]       Date: 04/10/2022 DOB: [birth year]  Age: 86 y.o. MRN#: [id number] Attending Physician: Kerney Elbe, DO Primary Care Physician: Wardell Honour, MD Admit Date: 04/04/2022  Reason for Consultation/Follow-up: Establishing goals of care  Subjective: Awake alert oriented resting in bed, denies complaints.  Discussed with patient again about broad goals of care, especially in light of physical therapy recommendations see below.  Length of Stay: 6  Current Medications: Scheduled Meds:   budesonide (PULMICORT) nebulizer solution  0.25 mg Nebulization BID   Chlorhexidine Gluconate Cloth  6 each Topical Daily   feeding supplement (GLUCERNA SHAKE)  237 mL Oral BID BM   heparin  5,000 Units Subcutaneous Q8H   insulin aspart  0-9 Units Subcutaneous Q4H   insulin glargine-yfgn  10 Units Subcutaneous QHS   ipratropium-albuterol  3 mL Nebulization BID   leptospermum manuka honey  1 Application Topical Daily   lidocaine  1 patch Transdermal Q24H   polyethylene glycol  17 g Oral BID   Ensure Max Protein  11 oz Oral Daily   senna-docusate  1 tablet Oral BID   tamsulosin  0.4 mg Oral QPC supper    Continuous Infusions:  ampicillin-sulbactam (UNASYN) IV 3 g (04/10/22 0515)    PRN Meds: acetaminophen, bisacodyl, ondansetron (ZOFRAN) IV  Physical Exam         Frail appearing lady Chronically ill-appearing No acute distress Diminished breath sounds S1-S2 No edema Generalized weakness  Vital Signs: BP 120/60 (BP Location: Right Arm)   Pulse 93   Temp 99.3 F (37.4 C) (Oral)   Resp 15   Ht 5' 3"  (1.6 m)   Wt 51.3 kg   SpO2 93%   BMI 20.03 kg/m  SpO2: SpO2: 93 % O2 Device: O2 Device: Room Air O2 Flow Rate:     Intake/output summary:  Intake/Output Summary (Last 24 hours) at 04/10/2022 1139 Last data filed at 04/10/2022 0432 Gross per 24 hour  Intake 400 ml  Output 2725 ml  Net -2325 ml    LBM: Last BM Date : 04/06/22 Baseline Weight: Weight: 54 kg Most recent weight: Weight: 51.3 kg       Palliative Assessment/Data:      Patient Active Problem List   Diagnosis Date Noted  Acute cystitis without hematuria 04/04/2022   Acute urinary retention 04/04/2022   T12 vertebral fracture ([place]) 04/04/2022   Peripheral arterial disease ([place]) 03/22/2022   Osteomyelitis of fifth toe of left foot ([place]) 03/14/2022   Urinary frequency 03/01/2022   Hyperglycemia due to type 2 diabetes mellitus ([place]) 01/10/2022   History of CVA (cerebrovascular accident) 01/10/2022   Dementia ([place]) 12/09/2021   Polycythemia 12/09/2021   Fall    Stricture and stenosis of esophagus    Abnormal esophagram    Dysphagia    Protein-calorie malnutrition, severe 10/03/2021   Diabetic foot ulcers ([place]) - right foot 10/02/2021   Sepsis without acute organ dysfunction ([place]) 10/02/2021   Seborrheic dermatitis 10/12/2020   Pain due to onychomycosis of toenails of both feet 09/01/2020   Sjogren's syndrome with keratoconjunctivitis sicca ([place]) 06/07/2020   Mild cognitive impairment with memory loss 01/29/2020   Full code status 01/26/2020   Pacemaker 01/19/2020   Pressure injury of skin 01/14/2020   Complete heart block (HCC)    Acute metabolic encephalopathy    Acute on chronic respiratory failure with hypoxia (HCC)    Scoliosis due to degenerative disease of spine in adult patient 09/10/2018   Radicular pain of left lower extremity 09/10/2018   Hand arthritis 10/11/2017   Uncontrolled type 2 diabetes mellitus with hyperglycemia, with long-term current use of insulin ([place]) 03/08/2017   Thrombocytopenia ([place]) 11/08/2016   Degenerative lumbar spinal stenosis 10/27/2015   Arthralgia 09/22/2015   Chronic respiratory  failure with hypoxia ([place]) - home O2 of 2-3 L/min 01/11/2015   Diabetic polyneuropathy associated with type 2 diabetes mellitus ([place]) 11/16/2014   Hypertension 05/19/2013   Vertigo 05/11/2013   Dizziness 05/11/2013   Hyperlipidemia 02/15/2013   Uncontrolled type 2 diabetes mellitus with hyperglycemia (HCC) 01/01/2013   Benign paroxysmal positional vertigo 08/09/2012   Type II or unspecified type diabetes mellitus with neurological manifestations, uncontrolled(250.62) 08/09/2012   GLAUCOMA 03/16/2010   COPD mixed type with chronic respiratory failure  01/15/2009   Seasonal and perennial allergic rhinitis 11/23/2007   CHRONIC RHINITIS 07/16/2007   GERD (gastroesophageal reflux disease) 05/29/2007   I B S-DIARRHEAL PREDOMINATE 05/29/2007   FATTY LIVER DISEASE 05/29/2007    Palliative Care Assessment & Plan   Patient Profile:    Assessment: 86 year old lady, known to palliative medicine service, seen in a previous hospitalization.  Patient currently lives in an apartment with her 2 cats.  Patient has underlying history of diabetes COPD fatty liver disease hypertension dyslipidemia complete heart block status post pacemaker.  Patient used to work as a Primary school teacher.    Patient is currently admitted to hospital medicine service with infectious disease as well as podiatry specialists consulted.  Patient has left submetatarsal ulceration with possible underlying osteomyelitis.  She is on antibiotics. She has disease has also been following, patient has acute cystitis without hematuria, also found to have polymicrobial bacteremia E faecalis gram-positive cocci in clusters.  Patient states that she had a fall and is found to have T12 osteoporotic compression fracture with some associated pain.  Curbside neurosurgical input was also sought.   Palliative medicine team consulted for ongoing goals of care discussions.   Recommendations/Plan: Goals of care discussions undertaken with patient.   Discussed with her about physical therapy's recommendations for her to consider skilled nursing facility short-term rehab.  Discussed that as per PT, discharge home is not deemed a safe option.   Patient states that she has gone to rehab once and states that she does  not wish to consider going to SNF rehab.  She states that she has caregivers at home.  She wants to return home to her cats.  Denies pain, denies any other uncontrolled symptoms.  Monitor hospital course and overall disease trajectory of illness and TOC recommendations.  PMT will follow peripherally. Code Status:    Code Status Orders  (From admission, onward)           Start     Ordered   04/07/22 1350  Do not attempt resuscitation (DNR)  Continuous       Question Answer Comment  In the event of cardiac or respiratory ARREST Do not call a "code blue"   In the event of cardiac or respiratory ARREST Do not perform Intubation, CPR, defibrillation or ACLS   In the event of cardiac or respiratory ARREST Use medication by any route, position, wound care, and other measures to relive pain and suffering. May use oxygen, suction and manual treatment of airway obstruction as needed for comfort.      04/07/22 1349           Code Status History     Date Active Date Inactive Code Status Order ID Comments User Context   04/05/2022 0017 04/07/2022 1349 Full Code [id number]  Kristopher Oppenheim, DO Inpatient   01/10/2022 1905 01/13/2022 2132 DNR [id number]  Orma Flaming, MD ED   01/10/2022 1842 01/10/2022 1905 DNR [id number]  Orma Flaming, MD ED   12/08/2021 2359 12/12/2021 1918 Full Code [id number]  Clance Boll, MD ED   11/09/2021 1753 11/13/2021 2016 DNR [id number]  Lacinda Axon, MD ED   10/02/2021 2336 10/07/2021 1804 Full Code [id number]  Kristopher Oppenheim, DO Inpatient   08/09/2021 1421 08/13/2021 2226 Full Code [id number]  Karmen Bongo, MD ED   01/26/2020 2113 08/09/2021 1038 Full Code [id number]  Ngetich, Nelda Bucks, NP Outpatient   01/19/2020 0825  01/23/2020 2013 Full Code [id number]  Norval Morton, MD ED   01/13/2020 1408 01/17/2020 0036 Full Code [id number]  Mckinley Jewel, MD ED   11/08/2016 1649 11/10/2016 1837 Full Code [id number]  Lily Kocher, MD ED   05/11/2013 0746 05/13/2013 1852 Full Code [id number]  Theressa Millard, MD Inpatient   05/11/2013 0243 05/11/2013 0746 Full Code [id number]  Theressa Millard, MD Inpatient       Prognosis:  Unable to determine  Discharge Planning: Home with Palliative Services vs skilled nursing facility rehabilitation with palliative services. Care plan was discussed with  patient  Thank you for allowing the Palliative Medicine Team to assist in the care of this patient.  Mod MDM    Greater than 50%  of this time was spent counseling and coordinating care related to the above assessment and plan.  Loistine Chance, MD  Please contact Palliative Medicine Team phone at [phone] for questions and concerns.

## 2022-04-11 DIAGNOSIS — N3000 Acute cystitis without hematuria: Secondary | ICD-10-CM | POA: Diagnosis not present

## 2022-04-11 DIAGNOSIS — A419 Sepsis, unspecified organism: Secondary | ICD-10-CM | POA: Diagnosis not present

## 2022-04-11 DIAGNOSIS — G9341 Metabolic encephalopathy: Secondary | ICD-10-CM | POA: Diagnosis not present

## 2022-04-11 DIAGNOSIS — R338 Other retention of urine: Secondary | ICD-10-CM | POA: Diagnosis not present

## 2022-04-11 LAB — GLUCOSE, CAPILLARY
Glucose-Capillary: 110 mg/dL — ABNORMAL HIGH (ref 70–99)
Glucose-Capillary: 154 mg/dL — ABNORMAL HIGH (ref 70–99)
Glucose-Capillary: 195 mg/dL — ABNORMAL HIGH (ref 70–99)
Glucose-Capillary: 214 mg/dL — ABNORMAL HIGH (ref 70–99)
Glucose-Capillary: 290 mg/dL — ABNORMAL HIGH (ref 70–99)
Glucose-Capillary: 86 mg/dL (ref 70–99)

## 2022-04-11 LAB — CULTURE, BLOOD (ROUTINE X 2)
Culture: NO GROWTH
Culture: NO GROWTH
Special Requests: ADEQUATE
Special Requests: ADEQUATE

## 2022-04-11 LAB — CBC WITH DIFFERENTIAL/PLATELET
Abs Immature Granulocytes: 0.06 10*3/uL (ref 0.00–0.07)
Basophils Absolute: 0.1 10*3/uL (ref 0.0–0.1)
Basophils Relative: 1 %
Eosinophils Absolute: 0.1 10*3/uL (ref 0.0–0.5)
Eosinophils Relative: 2 %
HCT: 38.6 % (ref 36.0–46.0)
Hemoglobin: 11.9 g/dL — ABNORMAL LOW (ref 12.0–15.0)
Immature Granulocytes: 1 %
Lymphocytes Relative: 30 %
Lymphs Abs: 2.2 10*3/uL (ref 0.7–4.0)
MCH: 27.4 pg (ref 26.0–34.0)
MCHC: 30.8 g/dL (ref 30.0–36.0)
MCV: 88.7 fL (ref 80.0–100.0)
Monocytes Absolute: 0.6 10*3/uL (ref 0.1–1.0)
Monocytes Relative: 8 %
Neutro Abs: 4.3 10*3/uL (ref 1.7–7.7)
Neutrophils Relative %: 58 %
Platelets: UNDETERMINED 10*3/uL (ref 150–400)
RBC: 4.35 MIL/uL (ref 3.87–5.11)
RDW: 14.7 % (ref 11.5–15.5)
WBC: 7.3 10*3/uL (ref 4.0–10.5)
nRBC: 0 % (ref 0.0–0.2)

## 2022-04-11 LAB — MAGNESIUM: Magnesium: 1.6 mg/dL — ABNORMAL LOW (ref 1.7–2.4)

## 2022-04-11 LAB — COMPREHENSIVE METABOLIC PANEL
ALT: 9 U/L (ref 0–44)
AST: 14 U/L — ABNORMAL LOW (ref 15–41)
Albumin: 2.2 g/dL — ABNORMAL LOW (ref 3.5–5.0)
Alkaline Phosphatase: 52 U/L (ref 38–126)
Anion gap: 9 (ref 5–15)
BUN: 16 mg/dL (ref 8–23)
CO2: 29 mmol/L (ref 22–32)
Calcium: 8.7 mg/dL — ABNORMAL LOW (ref 8.9–10.3)
Chloride: 95 mmol/L — ABNORMAL LOW (ref 98–111)
Creatinine, Ser: 0.53 mg/dL (ref 0.44–1.00)
GFR, Estimated: >60 mL/min (ref >60)
Glucose, Bld: 86 mg/dL (ref 70–99)
Potassium: 4.2 mmol/L (ref 3.5–5.1)
Sodium: 133 mmol/L — ABNORMAL LOW (ref 135–145)
Total Bilirubin: 0.4 mg/dL (ref 0.3–1.2)
Total Protein: 6.2 g/dL — ABNORMAL LOW (ref 6.5–8.1)

## 2022-04-11 LAB — PHOSPHORUS: Phosphorus: 3.1 mg/dL (ref 2.5–4.6)

## 2022-04-11 MED ORDER — ACETAMINOPHEN 325 MG PO TABS
650.0000 mg | ORAL_TABLET | Freq: Four times a day (QID) | ORAL | Status: DC | PRN
  Administered 2022-04-11 – 2022-04-18 (×5): 650 mg via ORAL
  Filled 2022-04-11 (×7): qty 2

## 2022-04-11 MED ORDER — ENSURE MAX PROTEIN PO LIQD
11.0000 [oz_av] | Freq: Two times a day (BID) | ORAL | Status: DC
  Administered 2022-04-11 – 2022-04-14 (×3): 11 [oz_av] via ORAL
  Filled 2022-04-11 (×15): qty 330

## 2022-04-11 MED ORDER — MAGNESIUM SULFATE 2 GM/50ML IV SOLN
2.0000 g | Freq: Once | INTRAVENOUS | Status: AC
  Administered 2022-04-11: 2 g via INTRAVENOUS
  Filled 2022-04-11: qty 50

## 2022-04-11 MED ORDER — ENSURE ENLIVE PO LIQD
237.0000 mL | ORAL | Status: DC
  Administered 2022-04-13: 237 mL via ORAL

## 2022-04-11 NOTE — Progress Notes (Signed)
Nutrition Follow-up  DOCUMENTATION CODES:   Severe malnutrition in context of chronic illness  INTERVENTION:  - Continue DYS 3 diet as medically appropriate.  - Ensure Max BID, each supplements provides 150 kcal and 30 grams of protein.  - Ensure Enlive daily, provides 350 kcal and 20 grams of protein - Encourage intake at all meals and of supplements.  - Monitor for glycemic control. - Monitor weights daily to trend.    NUTRITION DIAGNOSIS:   Severe Malnutrition related to chronic illness as evidenced by severe fat depletion, severe muscle depletion, percent weight loss. *ongoing  GOAL:   Patient will meet greater than or equal to 90% of their needs *being met  MONITOR:   PO intake, Supplement acceptance, Weight trends  REASON FOR ASSESSMENT:   Malnutrition Screening Tool    ASSESSMENT:   86 yo WF with hx of uncontrolled type 2 DM, history of complete heart block status post pacemaker, chronic protein calorie malnutrition, chronic respiratory failure on home oxygen to 3 L admitted acute cystitis without hematuria, T12 vertebral fracture, sepsis, and acute metabolic encephalopathy.  Patient reports a good appetite and that she feels she is eating well. Patient is documented to be consuming 80-100% of meals the past 4 days, average of 94%. She has occasionally be drinking her nutrition supplements however patient reports the Glucerna is too sweet and she doesn't like it. Agreeable to still receive Ensure Max and try 1 Ensure Enlive a day to further support intake. Encouraged intake to continue to eat well and consume supplements to support healthy weight gain.   Medications reviewed and include: Insulin, Miralax, Senokot  Labs reviewed: Na 133 Mg 1.6 HA1C 12.9 Blood Glucose 86-348 x24 hours   Diet Order:   Diet Order             DIET DYS 3 Room service appropriate? Yes; Fluid consistency: Thin  Diet effective now                   EDUCATION NEEDS:  No  education needs have been identified at this time  Skin:  Skin Assessment: Reviewed RN Assessment Skin Integrity Issues:: Stage II, Unstageable, Stage I Stage I: Mid Sacrum Stage II: L Hip Unstageable: L Toe  Last BM:  11/30  Height:  Ht Readings from Last 1 Encounters:  04/05/22 _0  (1.6 m)   Weight:  Wt Readings from Last 1 Encounters:  04/10/22 51.3 kg    BMI:  Body mass index is 20.03 kg/m.  Estimated Nutritional Needs:  Kcal:  1650-1900 kcal Protein:  70-95 grams Fluid:  >/= 1.6L    Samson Frederic RD, LDN For contact information, refer to [facility].

## 2022-04-11 NOTE — Progress Notes (Signed)
PT Cancellation Note  Patient Details Name: KERSTON LANDECK MRN: [id number] DOB: Mar 17, 1936   Cancelled Treatment:    Reason Eval/Treat Not Completed: Patient declined, no reason specified;Other (comment) (Pt asked PT to "go away". Benefits of mobility and risks of bedrest were explained. Pt refused to attempt getting out of bed, she wanted to eat her cake. Will follow.)   Philomena Doheny PT 04/11/2022  Acute Rehabilitation Services  Office [phone]

## 2022-04-11 NOTE — Progress Notes (Signed)
PROGRESS NOTE    [name]  KGM:[id number] DOB: July 18, 1935 DOA: 04/04/2022 PCP: Wardell Honour, MD   Brief Narrative:  The patient is a 86 year old female with history of diabetes mellitus type 2, complete heart block status post pacemaker, chronic hypoxic respiratory failure on home oxygen at 3 L/min, COPD, unspecified CVA, hypertension, hyperlipidemia, thrombocytopenia, chronic wounds including left metatarsal head osteomyelitis followed by Dr. Drucilla Schmidt on doxycycline and cefadroxil presented with altered mental status. On presentation, she was hypoglycemic and found to have UTI. She was started on IV fluids and broad-spectrum antibiotics. She was found to have polymicrobial bacteremia: ID was consulted; antibiotics switched to Unasyn. She was also found to have T12 vertebral fracture.     Patient does not want undergo surgical intervention #2 also ID is now recommended changing her to p.o. Augmentin for at least 4 weeks.  ID also feels that she does not need a TEE.  Palliative care discussion continue be had.  PT OT recommending SNF but patient does not want to go to SNF and wants to go home but is not a safe discharge disposition given that she does not have 24-hour caregiver support.  Will have PT OT to reevaluate and tried trial of void today but she declined a PT evaluation.   Assessment and Plan: Polymicrobial Bacteremia Sepsis: Present on Admisison -Blood cultures on admission positive for Enterococcus faecalis and Rothia. questionable cause.   -ID following.  Repeat blood cultures from 04/06/2022 are negative so far.  Currently on Unasyn as per ID. -2D echo was poor study which showed EF of 45 to 50% but no obvious vegetation. If the patient has persistent bacteremia, might need TEE -Per ID we will wait for repeat blood cultures 72 hours after to decide whether TEE is indicated especially due to low suspicion of endocarditis with polymicrobial growth we will bacteremia.  Repeat  blood cultures on 04/06/2022 showed no growth to date at 3 days and will defer to ID about TEE and ID feels that she does not need a TEE -ID recommends that she will need surgical intervention to cure the chronic left foot osteomyelitis which is the source of her polymicrobial bacteremia and she has refused surgical intervention for now.  ID recommends not obtaining TEE and recommend switching her antibiotics to amoxicillin clavulanic acid twice daily dosing for 3 additional weeks with seen by Dr. Drucilla Schmidt on 04/26/2022 -Have PT/OT Re-evalaute as she wants to go home (Does not Have 24/7 Caregiver Support)   UTI/Acute cystitis without hematuria -Urine cultures negative so far.  Antibiotics as above   Acute metabolic encephalopathy, improved  -Possibly from above.  Mental status improving.   -Still slow to respond and slightly confused to time.  Fall precautions.  Monitor mental status.  PT eval recommending SNF   Acute Urinary Retention -Currently has a Foley catheter.  Start Flomax.   -Will Try voiding trial today and if continues to retain will need reinsertion -Flush foley    T12 vertebral fracture -Cannot have the MRI because of pacemaker.   -CT shows acute T12 vertebral compression fracture. -Neurosurgery consulted as per ID recommendations.  Surgery feels that she has a T12 osteoporotic compression fracture with associated pain with no retropulsion or canal stenosis.  There is nothing to suggest osteomyelitis or discitis and they are recommending her be treated with bracing for pain control and given her osteomyelitis and bacteremia and history of sacral decubitus ulcer she is not a candidate for any type of intervention -  PT/OT Eval recommending SNF but patient wants to go home with home health and does not however SNF; Currently does not have 24/7 Caregiver support   Left Foot Lateral  osteomyelitis Left Lateral Foot Callus -Patient has received more than 7 weeks of oral doxycycline and  cefadroxil. -Dr. Starla Link had consulted podiatry as per ID recommendations to see if the left lateral foot callus needs any further investigation or treatment. -Infectious diseases feels that she needs no further antibiotics for the right foot given that she is getting 7+ weeks of doxycycline and then subsequently cefadroxil since 03/14/2020 -The podiatry team has evaluated her and patient continues to refuse her MRI as well as amputation and therefore they believe that she would benefit from long-term IV antibiotic management and remains on IV Unasyn -Podiatry feels that she is at high risk of losing her digit versus her foot versus the leg and they are recommending continuing local wound care with Betadine wet-to-dry dressings -PT/OT evaluating and recommending SNF patient does not want to go to SNF and wants to go home with home health and will need to figure out a safe discharge disposition and TOC involved   Leukocytosis -Resolved and WBC is 7.3 and improved  -Continue to Monitor and Trend -Repeat CBC in the AM    Hyponatremia -Mild and improved as sodium has gone from 134 -> 136 -> 133 -> 134 -> 133 -Continue monitor and trend and repeat CMP in a.m.   Hypomagnesemia -Replete with IV mag sulfate 4 g total yesterday -Mag Level is now 1.6 and will replete with IV mag sulfate 2 g again -Continue to monitor and trend and repeat CMP in a.m.   Severe protein calorie malnutrition -Nutrition Status: Nutrition Problem: Severe Malnutrition Etiology: chronic illness Signs/Symptoms: severe fat depletion, severe muscle depletion, percent weight loss Percent weight loss: 31 % (in 1 year) Interventions: Refer to RD note for recommendations, Premier Protein, Glucerna shake   Pressure Ulcers -Various wounds: I have personally evaluated and agree with the documentation of the wounds as below Pressure Injury 01/11/22 Sacrum Mid;Medial Stage 1 -  Intact skin with non-blanchable redness of a localized  area usually over a bony prominence. (Active)  01/11/22 0226  Location: Sacrum  Location Orientation: Mid;Medial  Staging: Stage 1 -  Intact skin with non-blanchable redness of a localized area usually over a bony prominence.  Wound Description (Comments):   Present on Admission: Yes     Pressure Injury 04/05/22 Hip Anterior;Left;Proximal Stage 2 -  Partial thickness loss of dermis presenting as a shallow open injury with a red, pink wound bed without slough. Round 1cmx1cm pressure sore, yellow center with pink edges, dry wound bed (Active)  04/05/22 0000  Location: Hip  Location Orientation: Anterior;Left;Proximal  Staging: Stage 2 -  Partial thickness loss of dermis presenting as a shallow open injury with a red, pink wound bed without slough.  Wound Description (Comments): Round 1cmx1cm pressure sore, yellow center with pink edges, dry wound bed  Present on Admission: Yes     Pressure Injury 04/05/22 Toe (Comment  which one) Left;Lateral Unstageable - Full thickness tissue loss in which the base of the injury is covered by slough (yellow, tan, gray, green or brown) and/or eschar (tan, brown or black) in the wound bed. 2cmx1cm (Active)  04/05/22 0000  Location: Toe (Comment  which one) (outer aspect of pinky toe)  Location Orientation: Left;Lateral  Staging: Unstageable - Full thickness tissue loss in which the base of the injury is  covered by slough (yellow, tan, gray, green or brown) and/or eschar (tan, brown or black) in the wound bed.  Wound Description (Comments): 2cmx1cm oval shaped wound, covered with grey/brown scabbing which has hair/fuzz? imbedded in it, dry with no drainage  Present on Admission: Yes  -C/w WOC Consult   History of Complete Heart Block status post Pacemaker -Outpatient follow-up with cardiology/EP   COPD Chronic respiratory failure with hypoxia -Currently stable -SpO2: 95 % (after neb) O2 Flow Rate (L/min): 2 L/min FiO2 (%): 28 %; Was not wearing  supplemental O2 this AM -Continue current nebs -Continue to Monitor Respiratory Status Carefully and repeat CXR prior to D/C   Diabetes mellitus type II with Hyperglycemia -Continue CBGs with SSI.  A1c 12.9 -Increase Semglee to 15 units nightly and added 3 units of NovoLog 3 times daily with meals and she needs further blood pressure control and continuing NovoLog 0 to 9 units every 4h -CBGs ranging from 86-290   Hypoalbuminemia -The patient's albumin level is now 2.2 -Cpntinue to monitor and trend and repeat CMP in a.m.   Abdominal pain with constipation, imprvoing  -Has not had a bowel movement in several days -Check KUB and showed "Nonspecific bowel gas pattern. Moderate colonic stool burden, most prominent in the hepatic flexure." -Restart stool softeners with senna docusate 1 tab p.o. twice daily, MiraLAX 17 g p.o. twice daily, as well as bisacodyl 10 g rectal suppository as needed -Monitor Bowel Movements   Normocytic Anemia -Patient's hemoglobin/hematocrit went from 12.1/30.4 is now 11.9/38.6 -Check anemia panel in a.m. -Continue to monitor for signs and symptoms of bleeding; no overt bleeding noted -Repeat CBC in the a.m.   Goals of care -Discussed with patient who is more awake and responsive and agrees for DNR.   -Consulted palliative care for goals of care discussion. -PT OT recommending SNF but patient does not want to go to SNF and wants to go home though home is not deemed a safe option.  Patient states that she is arranging caregivers for home and denies any other pain.  Palliative care recommends to continue to monitor her course and overall disease trajectory of illness and TOC recommendations.  DVT prophylaxis: heparin injection 5,000 Units Start: 04/05/22 0600 SCDs Start: 04/05/22 0018    Code Status: DNR Family Communication: No family currently at bedside and the patient states that "I do not have any family"  Disposition Plan:  Level of care:  Med-Surg Status is: Inpatient Remains inpatient appropriate because: Has unsafe discharge disposition if she goes home given that she does not have 24/7 caregiver support   Consultants:  [place] ID Neurosurgery  Procedures:  As delineated as above  Antimicrobials:  Anti-infectives (From admission, onward)    Start     Dose/Rate Route Frequency Ordered Stop   04/05/22 1700  Ampicillin-Sulbactam (UNASYN) 3 g in sodium chloride 0.9 % 100 mL IVPB        3 g 200 mL/hr over 30 Minutes Intravenous Every 6 hours 04/05/22 1643     04/05/22 0800  cefTRIAXone (ROCEPHIN) 1 g in sodium chloride 0.9 % 100 mL IVPB  Status:  Discontinued        1 g 200 mL/hr over 30 Minutes Intravenous Every 24 hours 04/05/22 0017 04/05/22 1643   04/04/22 2145  cefTRIAXone (ROCEPHIN) 1 g in sodium chloride 0.9 % 100 mL IVPB        1 g 200 mL/hr over 30 Minutes Intravenous  Once 04/04/22 2135 04/04/22 2240  Subjective: Seen and examined at bedside and still has not really gotten up out of bed.  Still feels weak but does not want to go to SNF.  Denies any chest pain.  Continues to have Foley catheter in place and will do a trial of void.  No other concerns or plaints this time but continues to refuse some interventions.  Objective: Vitals:   04/11/22 0406 04/11/22 0743 04/11/22 0744 04/11/22 1300  BP: 132/64   117/62  Pulse: 92   100  Resp: 18   (!) 24  Temp: 98.2 F (36.8 C)   98.8 F (37.1 C)  TempSrc: Oral   Oral  SpO2: 95% (!) 89% 95%   Weight:      Height:        Intake/Output Summary (Last 24 hours) at 04/11/2022 1745 Last data filed at 04/11/2022 1301 Gross per 24 hour  Intake 840 ml  Output 3750 ml  Net -2910 ml   Filed Weights   04/05/22 0415 04/09/22 0347 04/10/22 0500  Weight: 47.5 kg 52.2 kg 51.3 kg   Examination: Physical Exam:  Constitutional: Thin chronically ill-appearing Caucasian female currently no acute distress appears calm but slightly  agitated Respiratory: Diminished to auscultation bilaterally with coarse breath sounds, no wheezing, rales, rhonchi or crackles. Normal respiratory effort and patient is not tachypenic. No accessory muscle use.  Unlabored breathing Cardiovascular: RRR, no murmurs / rubs / gallops. S1 and S2 auscultated.  No appreciable extremity edema Abdomen: Soft, non-tender, non-distended.  Bowel sounds positive.  GU: Deferred. Musculoskeletal: No clubbing / cyanosis of digits/nails. No joint deformity upper and lower extremities.   Skin: No rashes noted but does have skin ulcers and the ulcer on her left lateral foot. Neurologic: CN 2-12 grossly intact with no focal deficits. Romberg sign and cerebellar reflexes not assessed.  Psychiatric: Normal judgment and insight. Alert and oriented x 3.  Slightly agitated mood and appropriate affect.   Data Reviewed: I have personally reviewed following labs and imaging studies  CBC: Recent Labs  Lab 04/05/22 0040 04/06/22 0402 04/07/22 0358 04/08/22 0403 04/09/22 0322 04/10/22 0320 04/11/22 0400  WBC 11.8*   < > 7.7 6.6 6.3 5.5 7.3  NEUTROABS 9.4*  --   --  4.1 3.7 2.9 4.3  HGB 14.2   < > 13.0 13.5 12.2 12.1 11.9*  HCT 46.4*   < > 41.9 42.3 38.0 38.4 38.6  MCV 88.0   < > 86.4 86.5 85.8 86.5 88.7  PLT 246   < > 194 176 192 199 PLATELET CLUMPS NOTED ON SMEAR, UNABLE TO ESTIMATE   < > = values in this interval not displayed.   Basic Metabolic Panel: Recent Labs  Lab 04/05/22 0040 04/06/22 0402 04/07/22 0358 04/08/22 0403 04/09/22 0322 04/10/22 0320 04/11/22 0400  NA 135   < > 134* 136 133* 134* 133*  K 4.7   < > 3.9 4.0 4.4 4.3 4.2  CL 98   < > 96* 94* 93* 94* 95*  CO2 25   < > 32 35* 33* 33* 29  GLUCOSE 391*   < > 166* 174* 254* 149* 86  BUN 31*   < > 16 18 19 17 16   CREATININE 0.85   < > 0.69 0.70 0.47 0.63 0.53  CALCIUM 9.0   < > 8.5* 9.0 8.7* 8.5* 8.7*  MG 1.8  --   --  1.4* 1.8 1.7 1.6*  PHOS  --   --   --   --  2.9 3.2 3.1   < > = values  in this interval not displayed.   GFR: Estimated Creatinine Clearance: 42.4 mL/min (by C-G formula based on SCr of 0.53 mg/dL). Liver Function Tests: Recent Labs  Lab 04/05/22 0040 04/08/22 0403 04/09/22 0322 04/10/22 0320 04/11/22 0400  AST 9* 13* 14* 13* 14*  ALT 9 9 9 8 9   ALKPHOS 63 55 54 47 52  BILITOT 0.8 0.2* 0.4 0.3 0.4  PROT 6.9 6.1* 6.1* 5.9* 6.2*  ALBUMIN 2.5* 2.2* 2.1* 2.1* 2.2*   Recent Labs  Lab 04/04/22 1816  LIPASE 25   No results for input(s): "AMMONIA" in the last 168 hours. Coagulation Profile: No results for input(s): "INR", "PROTIME" in the last 168 hours. Cardiac Enzymes: No results for input(s): "CKTOTAL", "CKMB", "CKMBINDEX", "TROPONINI" in the last 168 hours. BNP (last 3 results) No results for input(s): "PROBNP" in the last 8760 hours. HbA1C: No results for input(s): "HGBA1C" in the last 72 hours. CBG: Recent Labs  Lab 04/11/22 0001 04/11/22 0404 04/11/22 0734 04/11/22 1153 04/11/22 1628  GLUCAP 154* 86 110* 214* 290*   Lipid Profile: No results for input(s): "CHOL", "HDL", "LDLCALC", "TRIG", "CHOLHDL", "LDLDIRECT" in the last 72 hours. Thyroid Function Tests: No results for input(s): "TSH", "T4TOTAL", "FREET4", "T3FREE", "THYROIDAB" in the last 72 hours. Anemia Panel: No results for input(s): "VITAMINB12", "FOLATE", "FERRITIN", "TIBC", "IRON", "RETICCTPCT" in the last 72 hours. Sepsis Labs: Recent Labs  Lab 04/04/22 1816 04/05/22 0040  LATICACIDVEN 2.0* 1.9    Recent Results (from the past 240 hour(s))  Urine Culture     Status: None   Collection Time: 04/04/22  1:36 PM   Specimen: Urine, Clean Catch  Result Value Ref Range Status   Specimen Description   Final    URINE, CLEAN CATCH Performed at [facility], [place] [address] [name], [place] [address]    Special Requests   Final    NONE Performed at [facility], [place] [address] [place], [place] [address]    Culture   Final    NO  GROWTH Performed at [facility], [place] [address], [place], [place] [address]    Report Status 04/05/2022 FINAL  Final  Culture, blood (single)     Status: Abnormal   Collection Time: 04/04/22  6:16 PM   Specimen: BLOOD  Result Value Ref Range Status   Specimen Description   Final    BLOOD SITE NOT SPECIFIED Performed at [facility] [address] [place], [place] [address]    Special Requests   Final    BOTTLES DRAWN AEROBIC AND ANAEROBIC Blood Culture results may not be optimal due to an inadequate volume of blood received in culture bottles Performed at [facility] [address], [place], [place] [address]    Culture  Setup Time   Final    GRAM POSITIVE COCCI IN CHAINS ANAEROBIC BOTTLE ONLY CRITICAL RESULT CALLED TO, READ BACK BY AND VERIFIED WITH: PHARMD C. SHADE [id number] @1607  FH GRAM POSITIVE COCCI IN CLUSTERS BOTTLES DRAWN AEROBIC ONLY CRITICAL RESULT CALLED TO, READ BACK BY AND VERIFIED WITH: PHARMD [place] 04/06/22 @ 0707 BY AB    Culture (A)  Final    ENTEROCOCCUS FAECALIS ROTHIA MUCILAGINOSA Standardized susceptibility testing for this organism is not available. Performed at [facility], [place] [address] [place], [place] [address]    Report Status 04/07/2022 FINAL  Final   Organism ID, Bacteria ENTEROCOCCUS FAECALIS  Final  Susceptibility   Enterococcus faecalis - MIC*    AMPICILLIN <=2 SENSITIVE Sensitive     VANCOMYCIN 2 SENSITIVE Sensitive     GENTAMICIN SYNERGY SENSITIVE Sensitive     * ENTEROCOCCUS FAECALIS  Blood Culture ID Panel (Reflexed)     Status: Abnormal   Collection Time: 04/04/22  6:16 PM  Result Value Ref Range Status   Enterococcus faecalis DETECTED (A) NOT DETECTED Final    Comment: CRITICAL RESULT CALLED TO, READ BACK BY AND VERIFIED WITH: PHARMD C. SHADE [id number] @1607  FH    Enterococcus Faecium NOT DETECTED NOT DETECTED Final   Listeria monocytogenes NOT DETECTED NOT DETECTED Final    Staphylococcus species NOT DETECTED NOT DETECTED Final   Staphylococcus aureus (BCID) NOT DETECTED NOT DETECTED Final   Staphylococcus epidermidis NOT DETECTED NOT DETECTED Final   Staphylococcus lugdunensis NOT DETECTED NOT DETECTED Final   Streptococcus species DETECTED (A) NOT DETECTED Final    Comment: Not Enterococcus species, Streptococcus agalactiae, Streptococcus pyogenes, or Streptococcus pneumoniae. CRITICAL RESULT CALLED TO, READ BACK BY AND VERIFIED WITH: PHARMD C. SHADE [id number] @1607  FH    Streptococcus agalactiae NOT DETECTED NOT DETECTED Final   Streptococcus pneumoniae NOT DETECTED NOT DETECTED Final   Streptococcus pyogenes NOT DETECTED NOT DETECTED Final   A.calcoaceticus-baumannii NOT DETECTED NOT DETECTED Final   Bacteroides fragilis NOT DETECTED NOT DETECTED Final   Enterobacterales NOT DETECTED NOT DETECTED Final   Enterobacter cloacae complex NOT DETECTED NOT DETECTED Final   Escherichia coli NOT DETECTED NOT DETECTED Final   Klebsiella aerogenes NOT DETECTED NOT DETECTED Final   Klebsiella oxytoca NOT DETECTED NOT DETECTED Final   Klebsiella pneumoniae NOT DETECTED NOT DETECTED Final   Proteus species NOT DETECTED NOT DETECTED Final   Salmonella species NOT DETECTED NOT DETECTED Final   Serratia marcescens NOT DETECTED NOT DETECTED Final   Haemophilus influenzae NOT DETECTED NOT DETECTED Final   Neisseria meningitidis NOT DETECTED NOT DETECTED Final   Pseudomonas aeruginosa NOT DETECTED NOT DETECTED Final   Stenotrophomonas maltophilia NOT DETECTED NOT DETECTED Final   Candida albicans NOT DETECTED NOT DETECTED Final   Candida auris NOT DETECTED NOT DETECTED Final   Candida glabrata NOT DETECTED NOT DETECTED Final   Candida krusei NOT DETECTED NOT DETECTED Final   Candida parapsilosis NOT DETECTED NOT DETECTED Final   Candida tropicalis NOT DETECTED NOT DETECTED Final   Cryptococcus neoformans/gattii NOT DETECTED NOT DETECTED Final   Vancomycin resistance NOT  DETECTED NOT DETECTED Final    Comment: Performed at [facility], [address], [place], [place] [address]  Blood culture (routine x 2)     Status: None   Collection Time: 04/04/22  9:36 PM   Specimen: BLOOD  Result Value Ref Range Status   Specimen Description   Final    BLOOD RIGHT ANTECUBITAL Performed at [place] [facility] [address] [place], [place] [address]    Special Requests   Final    BOTTLES DRAWN AEROBIC ONLY Blood Culture results may not be optimal due to an inadequate volume of blood received in culture bottles Performed at [place] [address], [place], [place] [address]    Culture   Final    NO GROWTH 5 DAYS Performed at [facility], [place] [address] [place], [place] [address]    Report Status 04/10/2022 FINAL  Final  Blood culture (routine x 2)     Status: None   Collection Time: 04/04/22  9:41 PM  Specimen: BLOOD  Result Value Ref Range Status   Specimen Description   Final    BLOOD LEFT ANTECUBITAL Performed at [facility] [address] [place], [place] [address]    Special Requests   Final    BOTTLES DRAWN AEROBIC ONLY Blood Culture results may not be optimal due to an inadequate volume of blood received in culture bottles Performed at [facility] [address] [place], [place] [address]    Culture   Final    NO GROWTH 5 DAYS Performed at [facility], [place] [address], [place], [place] [address]    Report Status 04/10/2022 FINAL  Final  Blood Culture ID Panel (Reflexed)     Status: None   Collection Time: 04/06/22  5:43 AM  Result Value Ref Range Status   Enterococcus faecalis NOT DETECTED NOT DETECTED Final   Enterococcus Faecium NOT DETECTED NOT DETECTED Final   Listeria monocytogenes NOT DETECTED NOT DETECTED Final   Staphylococcus species NOT DETECTED NOT DETECTED Final   Staphylococcus aureus (BCID) NOT DETECTED NOT DETECTED Final    Staphylococcus epidermidis NOT DETECTED NOT DETECTED Final   Staphylococcus lugdunensis NOT DETECTED NOT DETECTED Final   Streptococcus species NOT DETECTED NOT DETECTED Final   Streptococcus agalactiae NOT DETECTED NOT DETECTED Final   Streptococcus pneumoniae NOT DETECTED NOT DETECTED Final   Streptococcus pyogenes NOT DETECTED NOT DETECTED Final   A.calcoaceticus-baumannii NOT DETECTED NOT DETECTED Final   Bacteroides fragilis NOT DETECTED NOT DETECTED Final   Enterobacterales NOT DETECTED NOT DETECTED Final   Enterobacter cloacae complex NOT DETECTED NOT DETECTED Final   Escherichia coli NOT DETECTED NOT DETECTED Final   Klebsiella aerogenes NOT DETECTED NOT DETECTED Final   Klebsiella oxytoca NOT DETECTED NOT DETECTED Final   Klebsiella pneumoniae NOT DETECTED NOT DETECTED Final   Proteus species NOT DETECTED NOT DETECTED Final   Salmonella species NOT DETECTED NOT DETECTED Final   Serratia marcescens NOT DETECTED NOT DETECTED Final   Haemophilus influenzae NOT DETECTED NOT DETECTED Final   Neisseria meningitidis NOT DETECTED NOT DETECTED Final   Pseudomonas aeruginosa NOT DETECTED NOT DETECTED Final   Stenotrophomonas maltophilia NOT DETECTED NOT DETECTED Final   Candida albicans NOT DETECTED NOT DETECTED Final   Candida auris NOT DETECTED NOT DETECTED Final   Candida glabrata NOT DETECTED NOT DETECTED Final   Candida krusei NOT DETECTED NOT DETECTED Final   Candida parapsilosis NOT DETECTED NOT DETECTED Final   Candida tropicalis NOT DETECTED NOT DETECTED Final   Cryptococcus neoformans/gattii NOT DETECTED NOT DETECTED Final    Comment: Performed at [facility], [place] [address], [place], [place] [address]  Culture, blood (Routine X 2) w Reflex to ID Panel     Status: None   Collection Time: 04/06/22 12:02 PM   Specimen: BLOOD LEFT HAND  Result Value Ref Range Status   Specimen Description   Final    BLOOD LEFT HAND Performed at [facility]  [address], [place], [place] [address]    Special Requests   Final    IN PEDIATRIC BOTTLE Blood Culture adequate volume Performed at [facility] [address] [place], [place] [address]    Culture   Final    NO GROWTH 5 DAYS Performed at [facility], [place] [address], [place], [place] [address]    Report Status 04/11/2022 FINAL  Final  Culture, blood (Routine X 2) w Reflex to ID Panel  Status: None   Collection Time: 04/06/22 12:02 PM   Specimen: BLOOD LEFT ARM  Result Value Ref Range Status   Specimen Description   Final    BLOOD LEFT ARM Performed at [facility], [place] [address], [place], [place] [address]    Special Requests   Final    IN PEDIATRIC BOTTLE Blood Culture adequate volume Performed at [facility] [address], [place], [place] [address]    Culture   Final    NO GROWTH 5 DAYS Performed at [facility], [place] [address], [place], [place] [address]    Report Status 04/11/2022 FINAL  Final    Radiology Studies: No results found.  Scheduled Meds:  budesonide (PULMICORT) nebulizer solution  0.25 mg Nebulization BID   Chlorhexidine Gluconate Cloth  6 each Topical Daily   feeding supplement  237 mL Oral Q24H   heparin  5,000 Units Subcutaneous Q8H   insulin aspart  0-9 Units Subcutaneous Q4H   insulin glargine-yfgn  15 Units Subcutaneous QHS   ipratropium-albuterol  3 mL Nebulization BID   leptospermum manuka honey  1 Application Topical Daily   lidocaine  1 patch Transdermal Q24H   polyethylene glycol  17 g Oral BID   Ensure Max Protein  11 oz Oral BID   senna-docusate  1 tablet Oral BID   tamsulosin  0.4 mg Oral QPC supper   Continuous Infusions:  ampicillin-sulbactam (UNASYN) IV 3 g (04/11/22 1253)    LOS: 7 days   Kerney Elbe, DO [facility] Available via Epic secure chat 7am-7pm After these hours, please refer to coverage provider listed on [email or web address] 04/11/2022,  5:45 PM

## 2022-04-11 NOTE — Progress Notes (Signed)
Foley catheter removed at [place], per MD order. Female external purewick placed.

## 2022-04-12 DIAGNOSIS — M86672 Other chronic osteomyelitis, left ankle and foot: Secondary | ICD-10-CM

## 2022-04-12 DIAGNOSIS — R7881 Bacteremia: Secondary | ICD-10-CM | POA: Diagnosis not present

## 2022-04-12 DIAGNOSIS — K5900 Constipation, unspecified: Secondary | ICD-10-CM | POA: Insufficient documentation

## 2022-04-12 DIAGNOSIS — Z0189 Encounter for other specified special examinations: Secondary | ICD-10-CM

## 2022-04-12 DIAGNOSIS — A419 Sepsis, unspecified organism: Secondary | ICD-10-CM | POA: Diagnosis not present

## 2022-04-12 DIAGNOSIS — N3000 Acute cystitis without hematuria: Secondary | ICD-10-CM | POA: Diagnosis not present

## 2022-04-12 LAB — CBC WITH DIFFERENTIAL/PLATELET
Abs Immature Granulocytes: 0.06 10*3/uL (ref 0.00–0.07)
Basophils Absolute: 0.1 10*3/uL (ref 0.0–0.1)
Basophils Relative: 1 %
Eosinophils Absolute: 0.1 10*3/uL (ref 0.0–0.5)
Eosinophils Relative: 2 %
HCT: 40.4 % (ref 36.0–46.0)
Hemoglobin: 12.7 g/dL (ref 12.0–15.0)
Immature Granulocytes: 1 %
Lymphocytes Relative: 23 %
Lymphs Abs: 1.4 10*3/uL (ref 0.7–4.0)
MCH: 27.4 pg (ref 26.0–34.0)
MCHC: 31.4 g/dL (ref 30.0–36.0)
MCV: 87.3 fL (ref 80.0–100.0)
Monocytes Absolute: 0.4 10*3/uL (ref 0.1–1.0)
Monocytes Relative: 6 %
Neutro Abs: 4.2 10*3/uL (ref 1.7–7.7)
Neutrophils Relative %: 67 %
Platelets: 187 10*3/uL (ref 150–400)
RBC: 4.63 MIL/uL (ref 3.87–5.11)
RDW: 14.9 % (ref 11.5–15.5)
WBC: 6.2 10*3/uL (ref 4.0–10.5)
nRBC: 0 % (ref 0.0–0.2)

## 2022-04-12 LAB — PHOSPHORUS: Phosphorus: 2.9 mg/dL (ref 2.5–4.6)

## 2022-04-12 LAB — COMPREHENSIVE METABOLIC PANEL
ALT: 10 U/L (ref 0–44)
AST: 15 U/L (ref 15–41)
Albumin: 2.2 g/dL — ABNORMAL LOW (ref 3.5–5.0)
Alkaline Phosphatase: 52 U/L (ref 38–126)
Anion gap: 7 (ref 5–15)
BUN: 15 mg/dL (ref 8–23)
CO2: 32 mmol/L (ref 22–32)
Calcium: 8.6 mg/dL — ABNORMAL LOW (ref 8.9–10.3)
Chloride: 91 mmol/L — ABNORMAL LOW (ref 98–111)
Creatinine, Ser: 0.66 mg/dL (ref 0.44–1.00)
GFR, Estimated: >60 mL/min (ref >60)
Glucose, Bld: 244 mg/dL — ABNORMAL HIGH (ref 70–99)
Potassium: 4.8 mmol/L (ref 3.5–5.1)
Sodium: 130 mmol/L — ABNORMAL LOW (ref 135–145)
Total Bilirubin: 0.3 mg/dL (ref 0.3–1.2)
Total Protein: 6.3 g/dL — ABNORMAL LOW (ref 6.5–8.1)

## 2022-04-12 LAB — GLUCOSE, CAPILLARY
Glucose-Capillary: 122 mg/dL — ABNORMAL HIGH (ref 70–99)
Glucose-Capillary: 196 mg/dL — ABNORMAL HIGH (ref 70–99)
Glucose-Capillary: 197 mg/dL — ABNORMAL HIGH (ref 70–99)
Glucose-Capillary: 202 mg/dL — ABNORMAL HIGH (ref 70–99)
Glucose-Capillary: 228 mg/dL — ABNORMAL HIGH (ref 70–99)
Glucose-Capillary: 244 mg/dL — ABNORMAL HIGH (ref 70–99)
Glucose-Capillary: 247 mg/dL — ABNORMAL HIGH (ref 70–99)

## 2022-04-12 LAB — MAGNESIUM: Magnesium: 1.8 mg/dL (ref 1.7–2.4)

## 2022-04-12 MED ORDER — AMOXICILLIN-POT CLAVULANATE 875-125 MG PO TABS
1.0000 | ORAL_TABLET | Freq: Two times a day (BID) | ORAL | Status: DC
  Administered 2022-04-12 – 2022-04-27 (×31): 1 via ORAL
  Filled 2022-04-12 (×31): qty 1

## 2022-04-12 MED ORDER — MAGNESIUM CITRATE PO SOLN
1.0000 | Freq: Once | ORAL | Status: AC
  Administered 2022-04-12: 1 via ORAL

## 2022-04-12 MED ORDER — LACTULOSE 10 GM/15ML PO SOLN
20.0000 g | Freq: Every day | ORAL | Status: DC
  Administered 2022-04-13 – 2022-04-27 (×9): 20 g via ORAL
  Filled 2022-04-12 (×12): qty 30

## 2022-04-12 NOTE — Assessment & Plan Note (Signed)
-   Patient noted with distended abdomen although no nausea/vomiting - Last abdominal imaging from 04/08/2022 noted with moderate stool burden - Bowel regimen has been ordered.  Patient declining further laxatives

## 2022-04-12 NOTE — Progress Notes (Signed)
  Daily Progress Note   Patient Name: [name]       Date: 04/12/2022 DOB: 09/08/1935  Age: 86 y.o. MRN#: [id number] Attending Physician: Dwyane Dee, MD Primary Care Physician: Wardell Honour, MD Admit Date: 04/04/2022 Length of Stay: 8 days  PMT has been following peripherally. Patient is having difficulties with a safe disposition at this time based on her decisions regarding medical care. Appreciate hospitalist's and TOC's assistance with care. Please reach out if further PMT needs occur. Thank you.   Chelsea Aus, DO Palliative Care Provider PMT # [phone]

## 2022-04-12 NOTE — Assessment & Plan Note (Signed)
-   1/4 bottles from admission growing E faecalis and Rothia Mucilaginosa - suspected etiology/source would be her left foot ongoing chronic wound; still weeping pus - ID followed - TTE negative; no need for TEE - abx transitioned to Augmentin for 4 weeks total and outpatient followup with ID on 04/26/22

## 2022-04-12 NOTE — Assessment & Plan Note (Signed)
-  Various wounds: I have personally evaluated and agree with the documentation of the wounds as below Pressure Injury 01/11/22 Sacrum Mid;Medial Stage 1 -  Intact skin with non-blanchable redness of a localized area usually over a bony prominence. (Active)  01/11/22 0226  Location: Sacrum  Location Orientation: Mid;Medial  Staging: Stage 1 -  Intact skin with non-blanchable redness of a localized area usually over a bony prominence.  Wound Description (Comments):   Present on Admission: Yes     Pressure Injury 04/05/22 Hip Anterior;Left;Proximal Stage 2 -  Partial thickness loss of dermis presenting as a shallow open injury with a red, pink wound bed without slough. Round 1cmx1cm pressure sore, yellow center with pink edges, dry wound bed (Active)  04/05/22 0000  Location: Hip  Location Orientation: Anterior;Left;Proximal  Staging: Stage 2 -  Partial thickness loss of dermis presenting as a shallow open injury with a red, pink wound bed without slough.  Wound Description (Comments): Round 1cmx1cm pressure sore, yellow center with pink edges, dry wound bed  Present on Admission: Yes     Pressure Injury 04/05/22 Toe (Comment  which one) Left;Lateral Unstageable - Full thickness tissue loss in which the base of the injury is covered by slough (yellow, tan, gray, green or brown) and/or eschar (tan, brown or black) in the wound bed. 2cmx1cm (Active)  04/05/22 0000  Location: Toe (Comment  which one) (outer aspect of pinky toe)  Location Orientation: Left;Lateral  Staging: Unstageable - Full thickness tissue loss in which the base of the injury is covered by slough (yellow, tan, gray, green or brown) and/or eschar (tan, brown or black) in the wound bed.  Wound Description (Comments): 2cmx1cm oval shaped wound, covered with grey/brown scabbing which has hair/fuzz? imbedded in it, dry with no drainage  Present on Admission: Yes  -C/w WOC Consult

## 2022-04-12 NOTE — Progress Notes (Signed)
Patient strongly refusing lactulose & mag citrate. She tried the lactulose & said "no that's too sweet I will have a seizure" & Mag citrate she says "no the doctor is bringing me something" I explained that this is what the Dr. ordered for her. She says " I don't need that I've been constipated since I was a little girl." Educated patient on the risks of constipation for an extended period of time. Patient states she understands.

## 2022-04-12 NOTE — Assessment & Plan Note (Addendum)
-   Last CT left foot 02/24/2022.  Shows ongoing soft tissue wound overlying lateral aspect of fifth MTP joint.  Cortical irregularity of fifth metatarsal head concerning for osteomyelitis - Followed outpatient by ID on chronic antibiotic suppression - Continue on Augmentin as per ID recommendations with outpatient follow-up with ID especially as she continues to decline surgery despite lacking capacity - continue betadine wet to dry dressings BID

## 2022-04-12 NOTE — Progress Notes (Signed)
Occupational Therapy Treatment Patient Details Name: [name] MRN: [id number] DOB: [birth year] Today's Date: 04/12/2022   History of present illness The patient is a 86 year old female with history of diabetes mellitus type 2, complete heart block status post pacemaker, chronic hypoxic respiratory failure on home oxygen at 3 L/min, COPD, unspecified CVA, hypertension, hyperlipidemia, thrombocytopenia, chronic wounds including left metatarsal head osteomyelitis. She was found to have a UTI. She was also found to have T12 vertebral fracture.  Surgery felt that she has a T12 osteoporotic compression fracture with associated pain with no retropulsion or canal stenosis.  There is nothing to suggest osteomyelitis or discitis and they are recommending her be treated with bracing for pain control   OT comments  Pt required mod assist with increased effort and cues for supine to sit. She initially presented with slight posterior leaning EOB, requiring min assist & verbal cues to correct. She sat edge of bed for several minutes, while being instructed on simple therapeutic exercises for strengthening needed to facilitate improved ADL performance; she required SBA to min assist in other to perform exercises. She gently deferred attempts at out of bed, due to feelings of fatigue. She subsequently required min assist for sit to supine. She will continue to benefit from further OT servcies to facilitate progressive ADL performance, and to decrease the risk for further weakness and deconditioning.    Recommendations for follow up therapy are one component of a multi-disciplinary discharge planning process, led by the attending physician.  Recommendations may be updated based on patient status, additional functional criteria and insurance authorization.    Follow Up Recommendations  Skilled nursing-short term rehab (<3 hours/day)     Assistance Recommended at Discharge Frequent or constant  Supervision/Assistance  Patient can return home with the following  A lot of help with bathing/dressing/bathroom;Assistance with cooking/housework;Direct supervision/assist for financial management;Assist for transportation;Help with stairs or ramp for entrance;Direct supervision/assist for medications management;A lot of help with walking and/or transfers   Equipment Recommendations  Other (comment)       Precautions / Restrictions Precautions Precautions: Back;Fall Precaution Comments: No formal back precautions Restrictions Weight Bearing Restrictions: No Other Position/Activity Restrictions: No brace ordered       Mobility Bed Mobility Overal bed mobility: Needs Assistance Bed Mobility: Supine to Sit, Sit to Supine     Supine to sit: Mod assist, HOB elevated Sit to supine: Min assist   General bed mobility comments: assist for trunk upright and cues to use bed rail to assist with transfers; assist for LE back onto bed    Transfers      General transfer comment: pt gently deferred attempts at standing         ADL either performed or assessed with clinical judgement   ADL Overall ADL's : Needs assistance/impaired Eating/Feeding: Independent;Bed level Eating/Feeding Details (indicate cue type and reason): based on clinical judgement Grooming: Set up;Sitting Grooming Details (indicate cue type and reason): simulated EOB             Lower Body Dressing: Maximal assistance;Sit to/from stand                                 Cognition Arousal/Alertness: Awake/alert     Area of Impairment: Memory        General Comments: able to follow 1 step commands consistently  Pertinent Vitals/ Pain       Pain Assessment Pain Assessment: No/denies pain         Frequency  Min 2X/week        Progress Toward Goals  OT Goals(current goals can now be found in the care plan section)     Acute Rehab OT Goals Patient Stated  Goal: to return home to her cats OT Goal Formulation: With patient Time For Goal Achievement: 04/23/22 Potential to Achieve Goals: [place] Discharge plan remains appropriate       AM-PAC OT "6 Clicks" Daily Activity     Outcome Measure   Help from another person eating meals?: None Help from another person taking care of personal grooming?: A Little Help from another person toileting, which includes using toliet, bedpan, or urinal?: A Lot Help from another person bathing (including washing, rinsing, drying)?: A Lot Help from another person to put on and taking off regular upper body clothing?: A Little Help from another person to put on and taking off regular lower body clothing?: Total 6 Click Score: 15    End of Session    OT Visit Diagnosis: Muscle weakness (generalized) (M62.81);Other symptoms and signs involving cognitive function   Activity Tolerance Other (comment) (Fair tolerance)   Patient Left in bed;with bed alarm set;with call bell/phone within reach   Nurse Communication Mobility status        Time: 4132-4401 OT Time Calculation (min): 20 min  Charges: OT General Charges $OT Visit: 1 Visit OT Treatments $Therapeutic Activity: 8-22 mins   Leota Sauers, OTR/L 04/12/2022, 5:33 PM

## 2022-04-12 NOTE — Hospital Course (Signed)
[name] is an 86 yo female with PMH CVA, BPPV, arthritis, COPD, DM II, HTN, glaucoma, HLD, IBS, left foot osteomyelitis, spinal stenosis, CHB s/p PPM, chronic hypoxia on 3L home O2.  She originally presented with altered mentation.  She was found to have hypoglycemia and UTI.  She was started on antibiotics and underwent further workup.  Evaluation then was notable for polymicrobial bacteremia.  ID was consulted. Due to concern for lack of capacity, psychiatry was also consulted.  Patient was deemed to not have capacity regarding her decision-making ability for surgery and for going to SNF.

## 2022-04-12 NOTE — Progress Notes (Signed)
Progress Note    [name]   QJJ:[id number]  DOB: January 28, 1936  DOA: 04/04/2022     8 PCP: Wardell Honour, MD  Initial CC: Altered mentation   Hospital Course: [name] is an 86 yo female with PMH CVA, BPPV, arthritis, COPD, DM II, HTN, glaucoma, HLD, IBS, left foot osteomyelitis, spinal stenosis, CHB s/p PPM, chronic hypoxia on 3L home O2.  She originally presented with altered mentation.  She was found to have hypoglycemia and UTI.  She was started on antibiotics and underwent further workup.  Evaluation then was notable for polymicrobial bacteremia.  ID was consulted. Due to concern for lack of capacity, psychiatry was also consulted.  Patient was deemed to not have capacity regarding her decision-making ability for surgery and for going to SNF.   Interval History:  No events overnight.  Resting in bed comfortable when seen this morning.  She very clearly could not state decision-making ability nor reason why she was declining surgery and not wanting to go to rehab without understanding risks and benefits of her decisions.  She also could not state what she would do at home if she were to fall or not have adequate help/assistance. Psychiatry has also evaluated.  Assessment and Plan: * Bacteremia - 1/4 bottles from admission growing E faecalis and Rothia Mucilaginosa - suspected etiology/source would be her left foot ongoing chronic wound; still weeping pus - ID followed - TTE negative; no need for TEE - abx transitioned to Augmentin for 4 weeks total and outpatient followup with ID on 04/26/22  Lacks decision making capacity - any encephalopathy from admission and infection would be considered resolved at this point - no other large electrolyte abnormalities to explain this large of an impairment -CT head performed 03/28/2022 noted with parenchymal atrophy and chronic small vessel disease; atrophy is most prominent in the frontal and temporal regions -Patient does have  some risk for development of vascular dementia esp given the location of her atrophy - per my evaluation patient lacks capacity and I've also asked psychiatry to evaluate patient; seen on 12/6 and psychiatry also agrees that patient lacks capacity for refusing SNF placement as well as surgical treatment (latter of which would be more complicated) however, this at least speaks towards her being unsafe for going home alone with no assistance/care - TOC requested for seeking guardianship  Chronic osteomyelitis of left foot ([place]) - Last CT left foot 02/24/2022.  Shows ongoing soft tissue wound overlying lateral aspect of fifth MTP joint.  Cortical irregularity of fifth metatarsal head concerning for osteomyelitis - Followed outpatient by ID on chronic antibiotic suppression - Continue on Augmentin as per ID recommendations with outpatient follow-up with ID especially as she continues to decline surgery despite lacking capacity  Sepsis without acute organ dysfunction (HCC)-resolved as of 04/12/2022 - Leukocytosis, tachycardia, source is suspected were urinary and foot wound on admission - See separate problems - Sepsis physiology resolved  Acute urinary retention-resolved as of 04/12/2022 - Now has external catheter in place  Acute cystitis without hematuria-resolved as of 04/12/2022 - Completed course for UTI  Uncontrolled type 2 diabetes mellitus with hyperglycemia ([place]) - A1c 12.9% on 04/05/2022.  Further speaks to her probable noncompliance and lack of capacity at home - Continue SSI and CBG monitoring - Continue Semglee  Constipation - Patient noted with distended abdomen although no nausea/vomiting - Last abdominal imaging from 04/08/2022 noted with moderate stool burden - Bowel regimen has been ordered.  Patient declining further laxatives  Hypomagnesemia - Replete as needed  T12 vertebral fracture ([place]) - unable to perform MRI due to pacer -Patient evaluated by neurosurgery.  T12  compression fracture felt to be related to osteoporosis; no associated pain nor retropulsion/stenosis -Brace recommended for comfort with ambulation.  No surgical intervention recommended - Continue working with PT/OT as able  Protein-calorie malnutrition, severe - Patient's BMI is Body mass index is 24.21 kg/m.. - Patient has the following signs/symptoms consistent with PCM: (fat loss, muscle loss, muscle wasting). - seen by RD, appreciate assistance. Continue plan per RD   Pacemaker Chronic.  Pressure injury of skin -Various wounds: I have personally evaluated and agree with the documentation of the wounds as below Pressure Injury 01/11/22 Sacrum Mid;Medial Stage 1 -  Intact skin with non-blanchable redness of a localized area usually over a bony prominence. (Active)  01/11/22 0226  Location: Sacrum  Location Orientation: Mid;Medial  Staging: Stage 1 -  Intact skin with non-blanchable redness of a localized area usually over a bony prominence.  Wound Description (Comments):   Present on Admission: Yes     Pressure Injury 04/05/22 Hip Anterior;Left;Proximal Stage 2 -  Partial thickness loss of dermis presenting as a shallow open injury with a red, pink wound bed without slough. Round 1cmx1cm pressure sore, yellow center with pink edges, dry wound bed (Active)  04/05/22 0000  Location: Hip  Location Orientation: Anterior;Left;Proximal  Staging: Stage 2 -  Partial thickness loss of dermis presenting as a shallow open injury with a red, pink wound bed without slough.  Wound Description (Comments): Round 1cmx1cm pressure sore, yellow center with pink edges, dry wound bed  Present on Admission: Yes     Pressure Injury 04/05/22 Toe (Comment  which one) Left;Lateral Unstageable - Full thickness tissue loss in which the base of the injury is covered by slough (yellow, tan, gray, green or brown) and/or eschar (tan, brown or black) in the wound bed. 2cmx1cm (Active)  04/05/22 0000  Location:  Toe (Comment  which one) (outer aspect of pinky toe)  Location Orientation: Left;Lateral  Staging: Unstageable - Full thickness tissue loss in which the base of the injury is covered by slough (yellow, tan, gray, green or brown) and/or eschar (tan, brown or black) in the wound bed.  Wound Description (Comments): 2cmx1cm oval shaped wound, covered with grey/brown scabbing which has hair/fuzz? imbedded in it, dry with no drainage  Present on Admission: Yes  -C/w WOC Consult  Chronic respiratory failure with hypoxia ([place]) - On 3 L chronically   Old records reviewed in assessment of this patient  Antimicrobials: Rocephin 04/04/2022 >> 04/05/2022 Unasyn 04/05/2022 >> 04/12/2022 Augmentin 04/12/2022 >> current  DVT prophylaxis:  heparin injection 5,000 Units Start: 04/05/22 0600 SCDs Start: 04/05/22 0018   Code Status:   Code Status: DNR  Mobility Assessment (last 72 hours)     Mobility Assessment     Row Name 04/12/22 1701 04/12/22 1549 04/12/22 1030 04/11/22 2010 04/11/22 0930   Does patient have an order for bedrest or is patient medically unstable -- -- No - Continue assessment No - Continue assessment No - Continue assessment   What is the highest level of mobility based on the progressive mobility assessment? Level 2 ([place]) - Balance while sitting on edge of bed and cannot stand Level 3 (Stands with assist) - Balance while standing  and cannot march in place Level 3 (Stands with assist) - Balance while standing  and cannot march in place -- Level 3 (Stands with assist) -  Balance while standing  and cannot march in place   Is the above level different from baseline mobility prior to current illness? -- -- Yes - Recommend PT order -- Yes - Recommend PT order    Row Name 04/10/22 2045 04/10/22 9518 04/09/22 2100       Does patient have an order for bedrest or is patient medically unstable No - Continue assessment No - Continue assessment No - Continue assessment     What is the  highest level of mobility based on the progressive mobility assessment? Level 3 (Stands with assist) - Balance while standing  and cannot march in place Level 3 (Stands with assist) - Balance while standing  and cannot march in place Level 3 (Stands with assist) - Balance while standing  and cannot march in place     Is the above level different from baseline mobility prior to current illness? No - Consider discontinuing PT/OT No - Consider discontinuing PT/OT No - Consider discontinuing PT/OT              Barriers to discharge: Guardianship needed.  Patient lacks capacity Disposition Plan: Unknown Status is: Inpt  Objective: Blood pressure (!) 141/79, pulse (!) 102, temperature 98.3 F (36.8 C), temperature source Oral, resp. rate 18, height 5' 3"  (1.6 m), weight 62 kg, SpO2 96 %.  Examination:  Physical Exam Constitutional:      Comments: Pleasantly confused elderly woman resting in bed in no distress  HENT:     Head: Normocephalic and atraumatic.     Mouth/Throat:     Mouth: Mucous membranes are moist.  Eyes:     Extraocular Movements: Extraocular movements intact.  Cardiovascular:     Rate and Rhythm: Normal rate and regular rhythm.  Pulmonary:     Effort: Pulmonary effort is normal.     Breath sounds: Normal breath sounds.  Abdominal:     General: Bowel sounds are normal. There is distension.     Palpations: Abdomen is soft.     Tenderness: There is no abdominal tenderness.  Musculoskeletal:     Cervical back: Normal range of motion.     Comments: Pus weeping from left foot wound with surrounding hardened wound  Skin:    General: Skin is warm and dry.  Neurological:     Mental Status: She is disoriented.      Consultants:  Podiatry Palliative Care Medicine ID Neurosurgery  Procedures:    Data Reviewed: Results for orders placed or performed during the hospital encounter of 04/04/22 (from the past 24 hour(s))  Glucose, capillary     Status: Abnormal    Collection Time: 04/11/22  8:09 PM  Result Value Ref Range   Glucose-Capillary 195 (H) 70 - 99 mg/dL  Glucose, capillary     Status: Abnormal   Collection Time: 04/12/22 12:14 AM  Result Value Ref Range   Glucose-Capillary 244 (H) 70 - 99 mg/dL  Glucose, capillary     Status: Abnormal   Collection Time: 04/12/22  4:23 AM  Result Value Ref Range   Glucose-Capillary 247 (H) 70 - 99 mg/dL  CBC with Differential/Platelet     Status: None   Collection Time: 04/12/22  4:24 AM  Result Value Ref Range   WBC 6.2 4.0 - 10.5 K/uL   RBC 4.63 3.87 - 5.11 MIL/uL   Hemoglobin 12.7 12.0 - 15.0 g/dL   HCT 40.4 36.0 - 46.0 %   MCV 87.3 80.0 - 100.0 fL   MCH 27.4 26.0 -  34.0 pg   MCHC 31.4 30.0 - 36.0 g/dL   RDW 14.9 11.5 - 15.5 %   Platelets 187 150 - 400 K/uL   nRBC 0.0 0.0 - 0.2 %   Neutrophils Relative % 67 %   Neutro Abs 4.2 1.7 - 7.7 K/uL   Lymphocytes Relative 23 %   Lymphs Abs 1.4 0.7 - 4.0 K/uL   Monocytes Relative 6 %   Monocytes Absolute 0.4 0.1 - 1.0 K/uL   Eosinophils Relative 2 %   Eosinophils Absolute 0.1 0.0 - 0.5 K/uL   Basophils Relative 1 %   Basophils Absolute 0.1 0.0 - 0.1 K/uL   Immature Granulocytes 1 %   Abs Immature Granulocytes 0.06 0.00 - 0.07 K/uL  Comprehensive metabolic panel     Status: Abnormal   Collection Time: 04/12/22  4:24 AM  Result Value Ref Range   Sodium 130 (L) 135 - 145 mmol/L   Potassium 4.8 3.5 - 5.1 mmol/L   Chloride 91 (L) 98 - 111 mmol/L   CO2 32 22 - 32 mmol/L   Glucose, Bld 244 (H) 70 - 99 mg/dL   BUN 15 8 - 23 mg/dL   Creatinine, Ser 0.66 0.44 - 1.00 mg/dL   Calcium 8.6 (L) 8.9 - 10.3 mg/dL   Total Protein 6.3 (L) 6.5 - 8.1 g/dL   Albumin 2.2 (L) 3.5 - 5.0 g/dL   AST 15 15 - 41 U/L   ALT 10 0 - 44 U/L   Alkaline Phosphatase 52 38 - 126 U/L   Total Bilirubin 0.3 0.3 - 1.2 mg/dL   GFR, Estimated >60 >60 mL/min   Anion gap 7 5 - 15  Phosphorus     Status: None   Collection Time: 04/12/22  4:24 AM  Result Value Ref Range    Phosphorus 2.9 2.5 - 4.6 mg/dL  Magnesium     Status: None   Collection Time: 04/12/22  4:24 AM  Result Value Ref Range   Magnesium 1.8 1.7 - 2.4 mg/dL  Glucose, capillary     Status: Abnormal   Collection Time: 04/12/22  8:06 AM  Result Value Ref Range   Glucose-Capillary 122 (H) 70 - 99 mg/dL  Glucose, capillary     Status: Abnormal   Collection Time: 04/12/22 11:34 AM  Result Value Ref Range   Glucose-Capillary 196 (H) 70 - 99 mg/dL  Glucose, capillary     Status: Abnormal   Collection Time: 04/12/22  4:19 PM  Result Value Ref Range   Glucose-Capillary 228 (H) 70 - 99 mg/dL   *Note: Due to a large number of results and/or encounters for the requested time period, some results have not been displayed. A complete set of results can be found in Results Review.    I have Reviewed nursing notes, Vitals, and Lab results since pt's last encounter. Pertinent lab results : see above I have ordered test including BMP, CBC, Mg I have reviewed the last note from staff over past 24 hours I have discussed pt's care plan and test results with nursing staff, case manager  Time spent: Greater than 50% of the 55 minute visit was spent in counseling/coordination of care for the patient as laid out in the A&P.    LOS: 8 days   Dwyane Dee, MD [facility] 04/12/2022, 5:44 PM

## 2022-04-12 NOTE — Assessment & Plan Note (Signed)
-   Replete as needed

## 2022-04-12 NOTE — Consult Note (Signed)
Decision being assessed:ability to make medical decisions In an evaluation of capacity, each of the following criteria must be met based for a patient to have capacity to make the decision in question.   Criterion 1: The patient demonstrates a clear and consistent voluntary choice with regard to treatment options. No Criterion 2: The patient adequately understands the disease they have, the treatment proposed, the risks of treatment, and the risks of other treatment (including no treatment). No Criterion 3: The patient acknowledges that the details of Criterion 2 apply to them specifically and the likely consequences of treatment options proposed. No Criterion 4: The patient demonstrates adequate reasoning/rationality within the context of their decision and can provide justification for their choice. No  In this case, the patient [name] DOES NOT have capacity to make medical decisions.   See patient interview below for details. Of note, this capacity evaluation assesses only for the specified decision documented above at the time of the assessment and is not a substitute for determination of the patient's overall competency, which can only be adjudicated.    Mental Capacity Assessment: I have evaluated the following areas to assess the [name]'s mental capacity regarding medical decision-making ability which pertains to competency to accept or refuse medical treatment.   The specific treatment or service in question is: refusing SNF for rehabilitation and surgical treatment.  Communication: The patient was unable to clearly state preferred treatment options for her medical care at this time. " I came here before and they sent me home and I did just fine without going to that place (SNF). Factors that could compromise this communication process include: infection, dementia.   Understanding: The patient was unable to recall information, link causal relationships, and process  general probabilities regarding life situations and medical treatment scenarios. She was able to paraphrase her view of the current situation and her thoughts about it including (or "but not including) future-oriented scenarios " You gone to croak one day or another. I could always come here and get treatment, they dont need no surgery. My toe is messed up its not an infection. It has been this way for months, and nothing has ever happened it will stay like this. " The patient did present with impairments in memory, attention span, or intelligence. She is alert and oriented x 2. She is able to recognize she is in the hospital, however unable to identify the day correct.    Appreciation: The patient was unable to identify and describe her various illnesses and treatment options with potential outcomes. The patient did not present with concerns such as denial or delusional thought-process. " I have been to a few of those places and dont never do anything. Its just a little corner of my foot. It has been this way, I dont know why they are making a big deal of it now. "     Rationalization: The patient was unable to weigh risks and benefits and unable to come to a conclusion congruent with patient's perceived goals. Concerns regarding this category are: depression, acute/chronic encephalopathy, electrolyte imbalances, infection.  In conclusion, the patient is-not experiencing an acute medical scenario: refusal of SNF which could compromise her mental capacity, especially with her underlying (dementia).   Conclusion: At this time, there is sufficient evidence to warrant removal of the patient's rights for medical decision-making. She can NOT clearly determine mental capacity for decision-making due to presence of underlying cognitive impairment.  At this time, we can determine  that the patient does NOT have functional mental capacity for medical decision-making including the right to accept or refuse SNF treatment  or surgical treatment.   A long discussion was had with patient regarding her medical complaints and her prognosis if she chooses not to engage in SNF, surgery, or treatment for her foot.  Patient is unable to express an understanding of both her medical condition and the proposed plan.  Patient is able to express a choice to proceed with surgery, however doesn't understand or appreciate the risks associated with her infection. Patient has an appreciation of the fact that she may be harmed if she refuses medical care.  Patient is unable to reason with writer or explain why she has made her decision the way she has made it.  For these reasons writer feels that patient does NOT capacity at this point in time to participate in treatment.   Psych consult was placed for mental capacity, as patient insight and judgement continue to fluctuate throughout this hospital admission. In terms of decision making capacity, patient does lack insight, and is unable to link causal relationships. This is evident by her inability to weigh in on current physical limitations.   -Consider APS referral for protection services for evaluation of services, mobilizing essential services on the behalf of the patient, and prevention.    A substitute decision maker must be sought to authorize medical intervention or to refuse treatment on behalf of the patient; for patients with advance directives, either the treatment choice that the patient made in advance or the choice of a surrogate decision maker may be indicated. In the absence of an advance directive and when time is available, the recommendation is usually to contact family members (the priority order to be approached is the spouse, adult children, parents, siblings, and other relatives).  Surrogate decision making for consent to make medical decisions for patient: The medical team has well-founded concerns regarding [name] capacity to provide consent for treatment.   The  medical team, together with the social work team, should by good faith attempt to find and contact any family or friends who may be able to make decisions on this patient's behalf.  While legal guardianship is ideal for the long-term benefit of a patient in this situation, there is no appointed guardian for [name] yet.    Psychiatry consult service to sign off at this time.  Thank you for this capacity consult.

## 2022-04-12 NOTE — Progress Notes (Signed)
Physical Therapy Treatment Patient Details Name: [name] MRN: [id number] DOB: [birth year] Today's Date: 04/12/2022   History of Present Illness The patient is a 86 year old female with history of diabetes mellitus type 2, complete heart block status post pacemaker, chronic hypoxic respiratory failure on home oxygen at 3 L/min, COPD, unspecified CVA, hypertension, hyperlipidemia, thrombocytopenia, chronic wounds including left metatarsal head osteomyelitis. She was found to have a UTI. SShe was also found to have T12 vertebral fracture.  Surgery felt that she has a T12 osteoporotic compression fracture with associated pain with no retropulsion or canal stenosis.  There is nothing to suggest osteomyelitis or discitis and they are recommending her be treated with bracing for pain control    PT Comments    Pt required some encouragement and RN in room to also assist.  Pt reports fearful of falling due to numerous previous falls.  Pt motivated by her desire to return home to her cats however only able to recall one cat's name, Sassy.  Pt requiring at least mod assist for bed mobility and standing today and does not appear safe to return home alone.  Continue to recommend SNF upon d/c.    Recommendations for follow up therapy are one component of a multi-disciplinary discharge planning process, led by the attending physician.  Recommendations may be updated based on patient status, additional functional criteria and insurance authorization.  Follow Up Recommendations  Skilled nursing-short term rehab (<3 hours/day) Can patient physically be transported by private vehicle: No   Assistance Recommended at Discharge Frequent or constant Supervision/Assistance  Patient can return home with the following A lot of help with walking and/or transfers;A little help with bathing/dressing/bathroom;Assistance with cooking/housework;Assist for transportation;Help with stairs or ramp for entrance   Equipment  Recommendations  None recommended by PT    Recommendations for Other Services       Precautions / Restrictions Precautions Precautions: Back;Fall Precaution Comments: No formal back precautions     Mobility  Bed Mobility Overal bed mobility: Needs Assistance Bed Mobility: Supine to Sit, Sit to Supine     Supine to sit: Mod assist Sit to supine: Mod assist   General bed mobility comments: assist for trunk upright, assist for LEs back onto bed    Transfers Overall transfer level: Needs assistance Equipment used: Rolling walker (2 wheels) Transfers: Sit to/from Stand Sit to Stand: Mod assist, From elevated surface, +2 safety/equipment           General transfer comment: cues for positioning and technique, RN assisted on pt's other side as pt reports fearful of falling ("I've had many falls.") only able to hold standing position for less then 45 seconds    Ambulation/Gait                   Stairs             Wheelchair Mobility    Modified Rankin (Stroke Patients Only)       Balance Overall balance assessment: Needs assistance, History of Falls Sitting-balance support: Feet supported, No upper extremity supported Sitting balance-Leahy Scale: Fair     Standing balance support: During functional activity, Reliant on assistive device for balance, Bilateral upper extremity supported Standing balance-Leahy Scale: Poor                              Cognition Arousal/Alertness: Awake/alert Behavior During Therapy: WFL for tasks assessed/performed   Area of Impairment: Safety/judgement  Safety/Judgement: Decreased awareness of safety, Decreased awareness of deficits     General Comments: unable to remember her second cat's name (has 2 cats, Sassy and ?)        Exercises      General Comments        Pertinent Vitals/Pain Pain Assessment Pain Assessment: Faces Faces Pain Scale: Hurts little  more Pain Location: back Pain Descriptors / Indicators: Guarding, Discomfort, Grimacing Pain Intervention(s): Repositioned, Monitored during session    Home Living                          Prior Function            PT Goals (current goals can now be found in the care plan section) Progress towards PT goals: Progressing toward goals    Frequency    Min 2X/week      PT Plan Current plan remains appropriate    Co-evaluation              AM-PAC PT "6 Clicks" Mobility   Outcome Measure  Help needed turning from your back to your side while in a flat bed without using bedrails?: A Lot Help needed moving from lying on your back to sitting on the side of a flat bed without using bedrails?: A Lot Help needed moving to and from a bed to a chair (including a wheelchair)?: A Lot Help needed standing up from a chair using your arms (e.g., wheelchair or bedside chair)?: A Lot Help needed to walk in hospital room?: Total Help needed climbing 3-5 steps with a railing? : Total 6 Click Score: 10    End of Session Equipment Utilized During Treatment: Gait belt Activity Tolerance: Patient limited by fatigue;Patient limited by pain Patient left: with call bell/phone within reach;in bed;with nursing/sitter in room Nurse Communication: Mobility status PT Visit Diagnosis: Difficulty in walking, not elsewhere classified (R26.2);Muscle weakness (generalized) (M62.81)     Time: 2094-7096 PT Time Calculation (min) (ACUTE ONLY): 20 min  Charges:  $Therapeutic Activity: 8-22 mins                    Jannette Spanner PT, DPT Physical Therapist Acute Rehabilitation Services Preferred contact method: Secure Chat Weekend Pager Only: [phone] Office: [place] 04/12/2022, 3:51 PM

## 2022-04-13 DIAGNOSIS — R7881 Bacteremia: Secondary | ICD-10-CM | POA: Diagnosis not present

## 2022-04-13 DIAGNOSIS — M86672 Other chronic osteomyelitis, left ankle and foot: Secondary | ICD-10-CM | POA: Diagnosis not present

## 2022-04-13 DIAGNOSIS — Z0189 Encounter for other specified special examinations: Secondary | ICD-10-CM | POA: Diagnosis not present

## 2022-04-13 LAB — GLUCOSE, CAPILLARY
Glucose-Capillary: 197 mg/dL — ABNORMAL HIGH (ref 70–99)
Glucose-Capillary: 109 mg/dL — ABNORMAL HIGH (ref 70–99)
Glucose-Capillary: 225 mg/dL — ABNORMAL HIGH (ref 70–99)
Glucose-Capillary: 423 mg/dL — ABNORMAL HIGH (ref 70–99)
Glucose-Capillary: 242 mg/dL — ABNORMAL HIGH (ref 70–99)
Glucose-Capillary: 92 mg/dL (ref 70–99)

## 2022-04-13 MED ORDER — INSULIN ASPART 100 UNIT/ML IJ SOLN
0.0000 [IU] | Freq: Every day | INTRAMUSCULAR | Status: DC
  Administered 2022-04-14: 5 [IU] via SUBCUTANEOUS
  Administered 2022-04-17 – 2022-04-21 (×2): 3 [IU] via SUBCUTANEOUS
  Administered 2022-04-23: 4 [IU] via SUBCUTANEOUS

## 2022-04-13 MED ORDER — INSULIN ASPART 100 UNIT/ML IJ SOLN
0.0000 [IU] | Freq: Three times a day (TID) | INTRAMUSCULAR | Status: DC
  Administered 2022-04-13: 15 [IU] via SUBCUTANEOUS
  Administered 2022-04-14: 2 [IU] via SUBCUTANEOUS
  Administered 2022-04-14 – 2022-04-15 (×2): 5 [IU] via SUBCUTANEOUS
  Administered 2022-04-16: 8 [IU] via SUBCUTANEOUS
  Administered 2022-04-16 (×2): 3 [IU] via SUBCUTANEOUS
  Administered 2022-04-17: 5 [IU] via SUBCUTANEOUS
  Administered 2022-04-17 – 2022-04-18 (×3): 8 [IU] via SUBCUTANEOUS
  Administered 2022-04-18: 3 [IU] via SUBCUTANEOUS
  Administered 2022-04-18 – 2022-04-19 (×2): 5 [IU] via SUBCUTANEOUS
  Administered 2022-04-19 – 2022-04-20 (×2): 3 [IU] via SUBCUTANEOUS
  Administered 2022-04-20: 5 [IU] via SUBCUTANEOUS
  Administered 2022-04-20: 2 [IU] via SUBCUTANEOUS
  Administered 2022-04-21: 3 [IU] via SUBCUTANEOUS
  Administered 2022-04-21: 8 [IU] via SUBCUTANEOUS
  Administered 2022-04-21 – 2022-04-22 (×2): 5 [IU] via SUBCUTANEOUS
  Administered 2022-04-22 – 2022-04-23 (×3): 3 [IU] via SUBCUTANEOUS
  Administered 2022-04-23: 5 [IU] via SUBCUTANEOUS
  Administered 2022-04-23: 2 [IU] via SUBCUTANEOUS
  Administered 2022-04-24: 3 [IU] via SUBCUTANEOUS
  Administered 2022-04-24: 5 [IU] via SUBCUTANEOUS
  Administered 2022-04-24 – 2022-04-25 (×2): 3 [IU] via SUBCUTANEOUS
  Administered 2022-04-25: 5 [IU] via SUBCUTANEOUS
  Administered 2022-04-26: 2 [IU] via SUBCUTANEOUS

## 2022-04-13 NOTE — TOC Progression Note (Addendum)
Transition of Care [facility]) - Progression Note    Patient Details  Name: [name] MRN: [id number] Date of Birth: 09-21-1935  Transition of Care [facility]) CM/SW [place], LCSW Phone Number: 04/13/2022, 9:36 AM  Clinical Narrative:     TOC CSW spoke with pt's friend [name]) who stated she is pt's caretaker. Ms [name] reported that the patient lives alone, but she is there to help pt from 8:30 am to 12 pm and from 3 pm to 6 pm when the pt goes to bed. CSW inquired if pt's has any family members who are willing to make medical decisions for the pt, Ms [name] stated the pt does not have a family. CSW informed the friend about recommendations for skilled nursing. Ms [name] stated she spoke with pt and the pt does not want to go to rehab.  CSW made CPS report in regards to guardianship. TOC to follow    [place]   12:40pm  Received call back from APS intake, report was screen in and a SW will be assigned .TOC to follow.   Expected Discharge Plan and Services                                                 Social Determinants of Health (SDOH) Interventions    Readmission Risk Interventions     No data to display

## 2022-04-13 NOTE — NC FL2 (Signed)
[place] LEVEL OF CARE FORM     IDENTIFICATION  Patient Name: [name] Birthdate: Aug 05, 1935 Sex: female Admission Date (Current Location): 04/04/2022  [facility] and [place] Number:  Herbalist and Address:  [facility],  [place] [address] [place]      Provider Number: [id number]  Attending Physician Name and Address:  Dwyane Dee, MD  Relative Name and Phone Number:       Current Level of Care: Hospital Recommended Level of Care: [place] Prior Approval Number:    Date Approved/Denied:   PASRR Number:    Discharge Plan: SNF    Current Diagnoses: Patient Active Problem List   Diagnosis Date Noted   Bacteremia 04/12/2022   Chronic osteomyelitis of left foot ([place]) 04/12/2022   Hypomagnesemia 04/12/2022   Constipation 04/12/2022   T12 vertebral fracture ([place]) 04/04/2022   Peripheral arterial disease ([place]) 03/22/2022   Osteomyelitis of fifth toe of left foot ([place]) 03/14/2022   Urinary frequency 03/01/2022   Hyperglycemia due to type 2 diabetes mellitus ([place]) 01/10/2022   History of CVA (cerebrovascular accident) 01/10/2022   Dementia ([place]) 12/09/2021   Polycythemia 12/09/2021   Fall    Stricture and stenosis of esophagus    Abnormal esophagram    Dysphagia    Protein-calorie malnutrition, severe 10/03/2021   Diabetic foot ulcers ([place]) - right foot 10/02/2021   Seborrheic dermatitis 10/12/2020   Pain due to onychomycosis of toenails of both feet 09/01/2020   Sjogren's syndrome with keratoconjunctivitis sicca ([place]) 06/07/2020   Mild cognitive impairment with memory loss 01/29/2020   Full code status 01/26/2020   Pacemaker 01/19/2020   Pressure injury of skin 01/14/2020   Complete heart block ([place])    Lacks decision making capacity    Acute on chronic respiratory failure with hypoxia ([place])    Scoliosis due to degenerative disease of spine in adult patient 09/10/2018   Radicular pain of left  lower extremity 09/10/2018   Hand arthritis 10/11/2017   Uncontrolled type 2 diabetes mellitus with hyperglycemia, with long-term current use of insulin ([place]) 03/08/2017   Thrombocytopenia ([place]) 11/08/2016   Degenerative lumbar spinal stenosis 10/27/2015   Arthralgia 09/22/2015   Chronic respiratory failure with hypoxia ([place]) 01/11/2015   Diabetic polyneuropathy associated with type 2 diabetes mellitus ([place]) 11/16/2014   Hypertension 05/19/2013   Vertigo 05/11/2013   Dizziness 05/11/2013   Hyperlipidemia 02/15/2013   Uncontrolled type 2 diabetes mellitus with hyperglycemia (H[facility]) 01/01/2013   Benign paroxysmal positional vertigo 08/09/2012   Type II or unspecified type diabetes mellitus with neurological manifestations, uncontrolled(250.62) 08/09/2012   GLAUCOMA 03/16/2010   COPD mixed type with chronic respiratory failure  01/15/2009   Seasonal and perennial allergic rhinitis 11/23/2007   CHRONIC RHINITIS 07/16/2007   GERD (gastroesophageal reflux disease) 05/29/2007   I B S-DIARRHEAL PREDOMINATE 05/29/2007   FATTY LIVER DISEASE 05/29/2007    Orientation RESPIRATION BLADDER Height & Weight     Self, Place, Situation  Normal Continent Weight: 54 kg Height:  5' 3"  (160 cm)  BEHAVIORAL SYMPTOMS/MOOD NEUROLOGICAL BOWEL NUTRITION STATUS      Continent Diet (regular)  AMBULATORY STATUS COMMUNICATION OF NEEDS Skin   Extensive Assist Verbally Normal                       Personal Care Assistance Level of Assistance  Bathing, Feeding, Dressing Bathing Assistance: Limited assistance Feeding assistance: Limited assistance Dressing Assistance: Limited assistance  Functional Limitations Info  Sight, Hearing, Speech Sight Info: Adequate Hearing Info: Adequate Speech Info: Adequate    SPECIAL CARE FACTORS FREQUENCY  PT (By licensed PT), OT (By licensed OT)     PT Frequency: 5 x weekly OT Frequency: 5 x weekly            Contractures Contractures Info: Not present     Additional Factors Info  Code Status Code Status Info: dnr             Current Medications (04/13/2022):  This is the current hospital active medication list Current Facility-Administered Medications  Medication Dose Route Frequency Provider Last Rate Last Admin   acetaminophen (TYLENOL) suppository 650 mg  650 mg Rectal Q4H PRN Kristopher Oppenheim, DO       acetaminophen (TYLENOL) tablet 650 mg  650 mg Oral Q6H PRN Raiford Noble [place], DO   650 mg at 04/13/22 1152   albuterol (PROVENTIL) (2.5 MG/3ML) 0.083% nebulizer solution 2.5 mg  2.5 mg Inhalation Q6H PRN Raiford Noble Latif, DO   2.5 mg at 04/10/22 1331   amoxicillin-clavulanate (AUGMENTIN) 875-125 MG per tablet 1 tablet  1 tablet Oral Eddie Candle, MD   1 tablet at 04/13/22 0102   bisacodyl (DULCOLAX) suppository 10 mg  10 mg Rectal Daily PRN Raiford Noble Latif, DO       budesonide (PULMICORT) nebulizer solution 0.25 mg  0.25 mg Nebulization BID Kristopher Oppenheim, DO   0.25 mg at 04/13/22 0801   Chlorhexidine Gluconate Cloth 2 % PADS 6 each  6 each Topical Daily Kristopher Oppenheim, DO   6 each at 04/13/22 7253   feeding supplement (ENSURE ENLIVE / ENSURE PLUS) liquid 237 mL  237 mL Oral Q24H Sheikh, Georgina Quint [place], DO       heparin injection 5,000 Units  5,000 Units Subcutaneous Q8H Kristopher Oppenheim, DO   5,000 Units at 04/13/22 6644   insulin aspart (novoLOG) injection 0-9 Units  0-9 Units Subcutaneous Q4H Kristopher Oppenheim, DO   3 Units at 04/13/22 1153   insulin glargine-yfgn (SEMGLEE) injection 15 Units  15 Units Subcutaneous QHS Raiford Noble Lane, DO   15 Units at 04/12/22 2245   ipratropium-albuterol (DUONEB) 0.5-2.5 (3) MG/3ML nebulizer solution 3 mL  3 mL Nebulization BID Dibia, Manfred Shirts, MD   3 mL at 04/13/22 0801   lactulose (CHRONULAC) 10 GM/15ML solution 20 g  20 g Oral Daily Dwyane Dee, MD   20 g at 04/13/22 0829   leptospermum manuka honey (MEDIHONEY) paste 1 Application  1 Application Topical Daily Dibia, Manfred Shirts, MD   1 Application at  03/47/42 0829   lidocaine (LIDODERM) 5 % 1 patch  1 patch Transdermal Q24H Loistine Chance, MD   1 patch at 04/13/22 1240   ondansetron (ZOFRAN) injection 4 mg  4 mg Intravenous Q6H PRN Kristopher Oppenheim, DO   4 mg at 04/06/22 0811   polyethylene glycol (MIRALAX / GLYCOLAX) packet 17 g  17 g Oral BID Raiford Noble [place], DO   17 g at 04/13/22 5956   protein supplement (ENSURE MAX) liquid  11 oz Oral BID Raiford Noble [place], DO   11 oz at 04/13/22 3875   senna-docusate (Senokot-S) tablet 1 tablet  1 tablet Oral BID Raiford Noble [place], DO   1 tablet at 04/13/22 6433   tamsulosin (FLOMAX) capsule 0.4 mg  0.4 mg Oral QPC supper Aline August, MD   0.4 mg at 04/12/22 1819     Discharge Medications: Please see discharge summary  for a list of discharge medications.  Relevant Imaging Results:  Relevant Lab Results:   Additional Information SSN: [id number], Both COVID 1 boosters  Leeroy Cha, RN

## 2022-04-13 NOTE — Progress Notes (Signed)
Progress Note    [name]   BDZ:[id number]  DOB: 1935-11-10  DOA: 04/04/2022     9 PCP: Wardell Honour, MD  Initial CC: Altered mentation   Hospital Course: [name] is an 86 yo female with PMH CVA, BPPV, arthritis, COPD, DM II, HTN, glaucoma, HLD, IBS, left foot osteomyelitis, spinal stenosis, CHB s/p PPM, chronic hypoxia on 3L home O2.  She originally presented with altered mentation.  She was found to have hypoglycemia and UTI.  She was started on antibiotics and underwent further workup.  Evaluation then was notable for polymicrobial bacteremia.  ID was consulted. Due to concern for lack of capacity, psychiatry was also consulted.  Patient was deemed to not have capacity regarding her decision-making ability for surgery and for going to SNF.   Interval History:  No events overnight.  Still resting comfortably when seen this morning.  No questions this morning other than when she can go home. Unfortunately, she cannot show a higher understanding as for how she would care for herself at home and thus still lacks capacity.   Assessment and Plan: * Bacteremia - 1/4 bottles from admission growing E faecalis and Rothia Mucilaginosa - suspected etiology/source would be her left foot ongoing chronic wound; still weeping pus - ID followed - TTE negative; no need for TEE - abx transitioned to Augmentin for 4 weeks total and outpatient followup with ID on 04/26/22  Lacks decision making capacity - any encephalopathy from admission and infection would be considered resolved at this point - no other large electrolyte abnormalities to explain this large of an impairment -CT head performed 03/28/2022 noted with parenchymal atrophy and chronic small vessel disease; atrophy is most prominent in the frontal and temporal regions -Patient does have some risk for development of vascular dementia esp given the location of her atrophy - per my evaluation patient lacks capacity and I've  also asked psychiatry to evaluate patient; seen on 12/6 and psychiatry also agrees that patient lacks capacity for refusing SNF placement as well as surgical treatment (latter of which would be more complicated) however, this at least speaks towards her being unsafe for going home alone with no assistance/care - TOC requested for seeking guardianship  Chronic osteomyelitis of left foot ([place]) - Last CT left foot 02/24/2022.  Shows ongoing soft tissue wound overlying lateral aspect of fifth MTP joint.  Cortical irregularity of fifth metatarsal head concerning for osteomyelitis - Followed outpatient by ID on chronic antibiotic suppression - Continue on Augmentin as per ID recommendations with outpatient follow-up with ID especially as she continues to decline surgery despite lacking capacity - continue betadine wet to dry dressings BID  Sepsis without acute organ dysfunction (HCC)-resolved as of 04/12/2022 - Leukocytosis, tachycardia, source is suspected were urinary and foot wound on admission - See separate problems - Sepsis physiology resolved  Acute urinary retention-resolved as of 04/12/2022 - Now has external catheter in place  Acute cystitis without hematuria-resolved as of 04/12/2022 - Completed course for UTI  Uncontrolled type 2 diabetes mellitus with hyperglycemia ([place]) - A1c 12.9% on 04/05/2022.  Further speaks to her probable noncompliance and lack of capacity at home - Continue SSI and CBG monitoring - Continue Semglee  Constipation - Patient noted with distended abdomen although no nausea/vomiting - Last abdominal imaging from 04/08/2022 noted with moderate stool burden - Bowel regimen has been ordered.  Patient declining further laxatives  Hypomagnesemia - Replete as needed  T12 vertebral fracture (HCC) - unable to perform  MRI due to pacer -Patient evaluated by neurosurgery.  T12 compression fracture felt to be related to osteoporosis; no associated pain nor  retropulsion/stenosis -Brace recommended for comfort with ambulation.  No surgical intervention recommended - Continue working with PT/OT as able  Protein-calorie malnutrition, severe - Patient's BMI is Body mass index is 24.21 kg/m.. - Patient has the following signs/symptoms consistent with PCM: (fat loss, muscle loss, muscle wasting). - seen by RD, appreciate assistance. Continue plan per RD   Pacemaker Chronic.  Pressure injury of skin -Various wounds: I have personally evaluated and agree with the documentation of the wounds as below Pressure Injury 01/11/22 Sacrum Mid;Medial Stage 1 -  Intact skin with non-blanchable redness of a localized area usually over a bony prominence. (Active)  01/11/22 0226  Location: Sacrum  Location Orientation: Mid;Medial  Staging: Stage 1 -  Intact skin with non-blanchable redness of a localized area usually over a bony prominence.  Wound Description (Comments):   Present on Admission: Yes     Pressure Injury 04/05/22 Hip Anterior;Left;Proximal Stage 2 -  Partial thickness loss of dermis presenting as a shallow open injury with a red, pink wound bed without slough. Round 1cmx1cm pressure sore, yellow center with pink edges, dry wound bed (Active)  04/05/22 0000  Location: Hip  Location Orientation: Anterior;Left;Proximal  Staging: Stage 2 -  Partial thickness loss of dermis presenting as a shallow open injury with a red, pink wound bed without slough.  Wound Description (Comments): Round 1cmx1cm pressure sore, yellow center with pink edges, dry wound bed  Present on Admission: Yes     Pressure Injury 04/05/22 Toe (Comment  which one) Left;Lateral Unstageable - Full thickness tissue loss in which the base of the injury is covered by slough (yellow, tan, gray, green or brown) and/or eschar (tan, brown or black) in the wound bed. 2cmx1cm (Active)  04/05/22 0000  Location: Toe (Comment  which one) (outer aspect of pinky toe)  Location Orientation:  Left;Lateral  Staging: Unstageable - Full thickness tissue loss in which the base of the injury is covered by slough (yellow, tan, gray, green or brown) and/or eschar (tan, brown or black) in the wound bed.  Wound Description (Comments): 2cmx1cm oval shaped wound, covered with grey/brown scabbing which has hair/fuzz? imbedded in it, dry with no drainage  Present on Admission: Yes  -C/w WOC Consult  Chronic respiratory failure with hypoxia ([place]) - On 3 L chronically   Old records reviewed in assessment of this patient  Antimicrobials: Rocephin 04/04/2022 >> 04/05/2022 Unasyn 04/05/2022 >> 04/12/2022 Augmentin 04/12/2022 >> current  DVT prophylaxis:  heparin injection 5,000 Units Start: 04/05/22 0600 SCDs Start: 04/05/22 0018   Code Status:   Code Status: DNR  Mobility Assessment (last 72 hours)     Mobility Assessment     Row Name 04/12/22 2025 04/12/22 1701 04/12/22 1549 04/12/22 1030 04/11/22 2010   Does patient have an order for bedrest or is patient medically unstable No - Continue assessment -- -- No - Continue assessment No - Continue assessment   What is the highest level of mobility based on the progressive mobility assessment? -- Level 2 ([place]) - Balance while sitting on edge of bed and cannot stand Level 3 (Stands with assist) - Balance while standing  and cannot march in place Level 3 (Stands with assist) - Balance while standing  and cannot march in place --   Is the above level different from baseline mobility prior to current illness? -- -- -- Yes -  Recommend PT order --    [place] Name 04/11/22 0930 04/10/22 2045         Does patient have an order for bedrest or is patient medically unstable No - Continue assessment No - Continue assessment      What is the highest level of mobility based on the progressive mobility assessment? Level 3 (Stands with assist) - Balance while standing  and cannot march in place Level 3 (Stands with assist) - Balance while standing  and  cannot march in place      Is the above level different from baseline mobility prior to current illness? Yes - Recommend PT order No - Consider discontinuing PT/OT               Barriers to discharge: Guardianship needed.  Patient lacks capacity Disposition Plan: Unknown Status is: Inpt  Objective: Blood pressure (!) 141/68, pulse (!) 105, temperature 98.1 F (36.7 C), temperature source Oral, resp. rate 18, height 5' 3"  (1.6 m), weight 54 kg, SpO2 99 %.  Examination:  Physical Exam Constitutional:      Comments: Pleasantly confused elderly woman resting in bed in no distress  HENT:     Head: Normocephalic and atraumatic.     Mouth/Throat:     Mouth: Mucous membranes are moist.  Eyes:     Extraocular Movements: Extraocular movements intact.  Cardiovascular:     Rate and Rhythm: Normal rate and regular rhythm.  Pulmonary:     Effort: Pulmonary effort is normal.     Breath sounds: Normal breath sounds.  Abdominal:     General: Bowel sounds are normal. There is distension.     Palpations: Abdomen is soft.     Tenderness: There is no abdominal tenderness.  Musculoskeletal:     Cervical back: Normal range of motion.     Comments: Left foot wound covered with dressing (previously noted to be draining pus)  Skin:    General: Skin is warm and dry.  Neurological:     Mental Status: She is disoriented.      Consultants:  Podiatry Palliative Care Medicine ID Neurosurgery  Procedures:    Data Reviewed: Results for orders placed or performed during the hospital encounter of 04/04/22 (from the past 24 hour(s))  Glucose, capillary     Status: Abnormal   Collection Time: 04/12/22  7:59 PM  Result Value Ref Range   Glucose-Capillary 197 (H) 70 - 99 mg/dL  Glucose, capillary     Status: Abnormal   Collection Time: 04/12/22 11:58 PM  Result Value Ref Range   Glucose-Capillary 202 (H) 70 - 99 mg/dL  Glucose, capillary     Status: Abnormal   Collection Time: 04/13/22  4:04  AM  Result Value Ref Range   Glucose-Capillary 197 (H) 70 - 99 mg/dL  Glucose, capillary     Status: Abnormal   Collection Time: 04/13/22  7:44 AM  Result Value Ref Range   Glucose-Capillary 109 (H) 70 - 99 mg/dL  Glucose, capillary     Status: Abnormal   Collection Time: 04/13/22 11:43 AM  Result Value Ref Range   Glucose-Capillary 225 (H) 70 - 99 mg/dL   *Note: Due to a large number of results and/or encounters for the requested time period, some results have not been displayed. A complete set of results can be found in Results Review.    I have Reviewed nursing notes, Vitals, and Lab results since pt's last encounter. Pertinent lab results : see above I have  ordered test including BMP, CBC, Mg I have reviewed the last note from staff over past 24 hours I have discussed pt's care plan and test results with nursing staff, case manager  Time spent: Greater than 50% of the 55 minute visit was spent in counseling/coordination of care for the patient as laid out in the A&P.    LOS: 9 days   Dwyane Dee, MD [facility] 04/13/2022, 4:41 PM

## 2022-04-13 NOTE — TOC Progression Note (Signed)
Transition of Care [facility]) - Progression Note    Patient Details  Name: [name] MRN: [id number] Date of Birth: [birth year]  Transition of [facility]) CM/SW Contact  Leeroy Cha, RN Phone Number: 04/13/2022, 3:20 PM  Clinical Narrative:    Fl2 sent out to area snf's for review.   Expected Discharge Plan: [place] Barriers to Discharge: Continued Medical Work up  Expected Discharge Plan and Services Expected Discharge Plan: [place]   Discharge Planning Services: CM Consult Post Acute Care Choice: [place] Living arrangements for the past 2 months: Apartment                                       Social Determinants of Health (SDOH) Interventions    Readmission Risk Interventions   No data to display

## 2022-04-14 DIAGNOSIS — R7881 Bacteremia: Secondary | ICD-10-CM | POA: Diagnosis not present

## 2022-04-14 DIAGNOSIS — Z0189 Encounter for other specified special examinations: Secondary | ICD-10-CM | POA: Diagnosis not present

## 2022-04-14 DIAGNOSIS — M86672 Other chronic osteomyelitis, left ankle and foot: Secondary | ICD-10-CM | POA: Diagnosis not present

## 2022-04-14 LAB — GLUCOSE, CAPILLARY
Glucose-Capillary: 143 mg/dL — ABNORMAL HIGH (ref 70–99)
Glucose-Capillary: 225 mg/dL — ABNORMAL HIGH (ref 70–99)
Glucose-Capillary: 101 mg/dL — ABNORMAL HIGH (ref 70–99)
Glucose-Capillary: 224 mg/dL — ABNORMAL HIGH (ref 70–99)

## 2022-04-14 NOTE — Progress Notes (Signed)
Physical Therapy Treatment Patient Details Name: [name] MRN: [id number] DOB: 10/24/1935 Today's Date: 04/14/2022   History of Present Illness The patient is a 86 year old female with history of diabetes mellitus type 2, complete heart block status post pacemaker, chronic hypoxic respiratory failure on home oxygen at 3 L/min, COPD, unspecified CVA, hypertension, hyperlipidemia, thrombocytopenia, chronic wounds including left metatarsal head osteomyelitis. She was found to have a UTI. She was also found to have T12 vertebral fracture.  Surgery felt that she has a T12 osteoporotic compression fracture with associated pain with no retropulsion or canal stenosis.  There is nothing to suggest osteomyelitis or discitis and they are recommending her be treated with bracing for pain control    PT Comments    Pt required a lot of encouragement however eventually agreeable to OOB to recliner (mostly due to mattress and linen soaked with urine.)  Pt min-mod assist for mobility today.  Continue to recommend SNF upon d/c.    Recommendations for follow up therapy are one component of a multi-disciplinary discharge planning process, led by the attending physician.  Recommendations may be updated based on patient status, additional functional criteria and insurance authorization.  Follow Up Recommendations  Skilled nursing-short term rehab (<3 hours/day) Can patient physically be transported by private vehicle: No   Assistance Recommended at Discharge Frequent or constant Supervision/Assistance  Patient can return home with the following A lot of help with walking and/or transfers;A little help with bathing/dressing/bathroom;Assistance with cooking/housework;Assist for transportation;Help with stairs or ramp for entrance   Equipment Recommendations  None recommended by PT    Recommendations for Other Services       Precautions / Restrictions Precautions Precautions: Back;Fall Precaution  Comments: No formal back precautions     Mobility  Bed Mobility Overal bed mobility: Needs Assistance Bed Mobility: Supine to Sit     Supine to sit: Mod assist     General bed mobility comments: assist for trunk upright    Transfers Overall transfer level: Needs assistance Equipment used: Rolling walker (2 wheels) Transfers: Sit to/from Stand Sit to Stand: Mod assist   Step pivot transfers: Min assist       General transfer comment: assist to rise and steady, pt weak and not steady    Ambulation/Gait                   Stairs             Wheelchair Mobility    Modified Rankin (Stroke Patients Only)       Balance Overall balance assessment: Needs assistance, History of Falls         Standing balance support: During functional activity, Reliant on assistive device for balance, Bilateral upper extremity supported Standing balance-Leahy Scale: Poor                              Cognition Arousal/Alertness: Awake/alert Behavior During Therapy: WFL for tasks assessed/performed Overall Cognitive Status: No family/caregiver present to determine baseline cognitive functioning Area of Impairment: Memory                     Memory: Decreased short-term memory   Safety/Judgement: Decreased awareness of safety, Decreased awareness of deficits              Exercises      General Comments        Pertinent Vitals/Pain Pain Assessment Pain Assessment: No/denies pain Pain  Intervention(s):  (only c/o nausea today, RN notified) Remained on O2 [place] during session.    Home Living                          Prior Function            PT Goals (current goals can now be found in the care plan section) Progress towards PT goals: Progressing toward goals    Frequency    Min 2X/week      PT Plan Current plan remains appropriate    Co-evaluation              AM-PAC PT "6 Clicks" Mobility   Outcome  Measure  Help needed turning from your back to your side while in a flat bed without using bedrails?: A Lot Help needed moving from lying on your back to sitting on the side of a flat bed without using bedrails?: A Lot Help needed moving to and from a bed to a chair (including a wheelchair)?: A Lot Help needed standing up from a chair using your arms (e.g., wheelchair or bedside chair)?: A Lot Help needed to walk in hospital room?: Total Help needed climbing 3-5 steps with a railing? : Total 6 Click Score: 10    End of Session Equipment Utilized During Treatment: Gait belt Activity Tolerance: Patient limited by fatigue Patient left: in chair;with call bell/phone within reach;with chair alarm set Nurse Communication: Mobility status PT Visit Diagnosis: Difficulty in walking, not elsewhere classified (R26.2);Muscle weakness (generalized) (M62.81)     Time: 5997-7414 PT Time Calculation (min) (ACUTE ONLY): 22 min  Charges:  $Therapeutic Activity: 8-22 mins                     Jannette Spanner PT, DPT Physical Therapist Acute Rehabilitation Services Preferred contact method: Secure Chat Weekend Pager Only: [phone] Office: [place] 04/14/2022, 4:03 PM

## 2022-04-14 NOTE — Care Management Important Message (Signed)
Important Message  Patient Details IM Letter given Name: [name] MRN: [id number] Date of Birth: Dec 03, 1935   Medicare Important Message Given:  Yes     Kerin Salen 04/14/2022, 1:29 PM

## 2022-04-14 NOTE — Progress Notes (Signed)
Progress Note    [name]   WHQ:[id number]  DOB: [birth year]  DOA: 04/04/2022     10 PCP: Wardell Honour, MD  Initial CC: Altered mentation   Hospital Course: Ms. Joyner is an 86 yo female with PMH CVA, BPPV, arthritis, COPD, DM II, HTN, glaucoma, HLD, IBS, left foot osteomyelitis, spinal stenosis, CHB s/p PPM, chronic hypoxia on 3L home O2.  She originally presented with altered mentation.  She was found to have hypoglycemia and UTI.  She was started on antibiotics and underwent further workup.  Evaluation then was notable for polymicrobial bacteremia.  ID was consulted. Due to concern for lack of capacity, psychiatry was also consulted.  Patient was deemed to not have capacity regarding her decision-making ability for surgery and for going to SNF.   Interval History:  No events overnight.  Sitting in recliner when seen this morning.  She again was asking when she could go home but could not elaborate as to how she would adequately care for herself.  Assessment and Plan: * Bacteremia - 1/4 bottles from admission growing E faecalis and Rothia Mucilaginosa - suspected etiology/source would be her left foot ongoing chronic wound; still weeping pus - ID followed - TTE negative; no need for TEE - abx transitioned to Augmentin for 4 weeks total and outpatient followup with ID on 04/26/22  Lacks decision making capacity - any encephalopathy from admission and infection would be considered resolved at this point - no other large electrolyte abnormalities to explain this large of an impairment -CT head performed 03/28/2022 noted with parenchymal atrophy and chronic small vessel disease; atrophy is most prominent in the frontal and temporal regions -Patient does have some risk for development of vascular dementia esp given the location of her atrophy - per my evaluation patient lacks capacity and I've also asked psychiatry to evaluate patient; seen on 12/6 and psychiatry also  agrees that patient lacks capacity for refusing SNF placement as well as surgical treatment (latter of which would be more complicated) however, this at least speaks towards her being unsafe for going home alone with no assistance/care - TOC requested for seeking guardianship  Chronic osteomyelitis of left foot ([place]) - Last CT left foot 02/24/2022.  Shows ongoing soft tissue wound overlying lateral aspect of fifth MTP joint.  Cortical irregularity of fifth metatarsal head concerning for osteomyelitis - Followed outpatient by ID on chronic antibiotic suppression - Continue on Augmentin as per ID recommendations with outpatient follow-up with ID especially as she continues to decline surgery despite lacking capacity - continue betadine wet to dry dressings BID  Sepsis without acute organ dysfunction (HCC)-resolved as of 04/12/2022 - Leukocytosis, tachycardia, source is suspected were urinary and foot wound on admission - See separate problems - Sepsis physiology resolved  Acute urinary retention-resolved as of 04/12/2022 - Now has external catheter in place  Acute cystitis without hematuria-resolved as of 04/12/2022 - Completed course for UTI  Uncontrolled type 2 diabetes mellitus with hyperglycemia ([place]) - A1c 12.9% on 04/05/2022.  Further speaks to her probable noncompliance and lack of capacity at home - Continue SSI and CBG monitoring - Continue Semglee  Constipation - Patient noted with distended abdomen although no nausea/vomiting - Last abdominal imaging from 04/08/2022 noted with moderate stool burden - Bowel regimen has been ordered.  Patient declining further laxatives  Hypomagnesemia - Replete as needed  T12 vertebral fracture ([place]) - unable to perform MRI due to pacer -Patient evaluated by neurosurgery.  T12 compression fracture  felt to be related to osteoporosis; no associated pain nor retropulsion/stenosis -Brace recommended for comfort with ambulation.  No surgical  intervention recommended - Continue working with PT/OT as able  Protein-calorie malnutrition, severe - Patient's BMI is Body mass index is 24.21 kg/m.. - Patient has the following signs/symptoms consistent with PCM: (fat loss, muscle loss, muscle wasting). - seen by RD, appreciate assistance. Continue plan per RD   Pacemaker Chronic.  Pressure injury of skin -Various wounds: I have personally evaluated and agree with the documentation of the wounds as below Pressure Injury 01/11/22 Sacrum Mid;Medial Stage 1 -  Intact skin with non-blanchable redness of a localized area usually over a bony prominence. (Active)  01/11/22 0226  Location: Sacrum  Location Orientation: Mid;Medial  Staging: Stage 1 -  Intact skin with non-blanchable redness of a localized area usually over a bony prominence.  Wound Description (Comments):   Present on Admission: Yes     Pressure Injury 04/05/22 Hip Anterior;Left;Proximal Stage 2 -  Partial thickness loss of dermis presenting as a shallow open injury with a red, pink wound bed without slough. Round 1cmx1cm pressure sore, yellow center with pink edges, dry wound bed (Active)  04/05/22 0000  Location: Hip  Location Orientation: Anterior;Left;Proximal  Staging: Stage 2 -  Partial thickness loss of dermis presenting as a shallow open injury with a red, pink wound bed without slough.  Wound Description (Comments): Round 1cmx1cm pressure sore, yellow center with pink edges, dry wound bed  Present on Admission: Yes     Pressure Injury 04/05/22 Toe (Comment  which one) Left;Lateral Unstageable - Full thickness tissue loss in which the base of the injury is covered by slough (yellow, tan, gray, green or brown) and/or eschar (tan, brown or black) in the wound bed. 2cmx1cm (Active)  04/05/22 0000  Location: Toe (Comment  which one) (outer aspect of pinky toe)  Location Orientation: Left;Lateral  Staging: Unstageable - Full thickness tissue loss in which the base of  the injury is covered by slough (yellow, tan, gray, green or brown) and/or eschar (tan, brown or black) in the wound bed.  Wound Description (Comments): 2cmx1cm oval shaped wound, covered with grey/brown scabbing which has hair/fuzz? imbedded in it, dry with no drainage  Present on Admission: Yes  -C/w WOC Consult  Chronic respiratory failure with hypoxia ([place]) - On 3 L chronically   Old records reviewed in assessment of this patient  Antimicrobials: Rocephin 04/04/2022 >> 04/05/2022 Unasyn 04/05/2022 >> 04/12/2022 Augmentin 04/12/2022 >> current  DVT prophylaxis:  heparin injection 5,000 Units Start: 04/05/22 0600 SCDs Start: 04/05/22 0018   Code Status:   Code Status: DNR  Mobility Assessment (last 72 hours)     Mobility Assessment     Row Name 04/14/22 1424 04/12/22 2025 04/12/22 1701 04/12/22 1549 04/12/22 1030   Does patient have an order for bedrest or is patient medically unstable -- No - Continue assessment -- -- No - Continue assessment   What is the highest level of mobility based on the progressive mobility assessment? Level 3 (Stands with assist) - Balance while standing  and cannot march in place -- Level 2 ([place]) - Balance while sitting on edge of bed and cannot stand Level 3 (Stands with assist) - Balance while standing  and cannot march in place Level 3 (Stands with assist) - Balance while standing  and cannot march in place   Is the above level different from baseline mobility prior to current illness? -- -- -- -- Yes -  Recommend PT order    [place] Name 04/11/22 2010           Does patient have an order for bedrest or is patient medically unstable No - Continue assessment                Barriers to discharge: Guardianship needed.  Patient lacks capacity. Medically stable Disposition Plan: Unknown Status is: Inpt  Objective: Blood pressure 132/61, pulse 99, temperature 98 F (36.7 C), temperature source Oral, resp. rate 20, height 5' 3"  (1.6 m),  weight 54 kg, SpO2 99 %.  Examination:  Physical Exam Constitutional:      Comments: Pleasantly confused elderly woman in no distress  HENT:     Head: Normocephalic and atraumatic.     Mouth/Throat:     Mouth: Mucous membranes are moist.  Eyes:     Extraocular Movements: Extraocular movements intact.  Cardiovascular:     Rate and Rhythm: Normal rate and regular rhythm.  Pulmonary:     Effort: Pulmonary effort is normal.     Breath sounds: Normal breath sounds.  Abdominal:     General: Bowel sounds are normal. There is distension.     Palpations: Abdomen is soft.     Tenderness: There is no abdominal tenderness.  Musculoskeletal:     Cervical back: Normal range of motion.     Comments: Left foot wound covered with dressing (previously noted to be draining pus)  Skin:    General: Skin is warm and dry.  Neurological:     Mental Status: She is disoriented.      Consultants:  Podiatry Palliative Care Medicine ID Neurosurgery  Procedures:    Data Reviewed: Results for orders placed or performed during the hospital encounter of 04/04/22 (from the past 24 hour(s))  Glucose, capillary     Status: Abnormal   Collection Time: 04/13/22  4:44 PM  Result Value Ref Range   Glucose-Capillary 423 (H) 70 - 99 mg/dL  Glucose, capillary     Status: Abnormal   Collection Time: 04/13/22  5:34 PM  Result Value Ref Range   Glucose-Capillary 242 (H) 70 - 99 mg/dL  Glucose, capillary     Status: None   Collection Time: 04/13/22  7:42 PM  Result Value Ref Range   Glucose-Capillary 92 70 - 99 mg/dL  Glucose, capillary     Status: Abnormal   Collection Time: 04/14/22  8:24 AM  Result Value Ref Range   Glucose-Capillary 143 (H) 70 - 99 mg/dL  Glucose, capillary     Status: Abnormal   Collection Time: 04/14/22 11:27 AM  Result Value Ref Range   Glucose-Capillary 225 (H) 70 - 99 mg/dL   *Note: Due to a large number of results and/or encounters for the requested time period, some results  have not been displayed. A complete set of results can be found in Results Review.    I have Reviewed nursing notes, Vitals, and Lab results since pt's last encounter. Pertinent lab results : see above I have ordered test including BMP, CBC, Mg I have reviewed the last note from staff over past 24 hours I have discussed pt's care plan and test results with nursing staff, case manager     LOS: 10 days   Dwyane Dee, MD [facility] 04/14/2022, 3:43 PM

## 2022-04-14 NOTE — TOC Progression Note (Signed)
Transition of Care [facility]) - Progression Note    Patient Details  Name: [name] MRN: [id number] Date of Birth: Sep 13, 1935  Transition of Care [facility]) CM/SW [place], LCSW Phone Number: 04/14/2022, 4:13 PM  Clinical Narrative:    CSW spoke with Samule Ohm pt's APS worker , she has requested documentation in regards to pt's lack of capacity. Documentation has been emailed. TOC to follow.   Expected Discharge Plan: [place] Barriers to Discharge: Continued Medical Work up  Expected Discharge Plan and Services Expected Discharge Plan: [place]   Discharge Planning Services: CM Consult Post Acute Care Choice: [place] Living arrangements for the past 2 months: Apartment                                       Social Determinants of Health (SDOH) Interventions    Readmission Risk Interventions     No data to display

## 2022-04-15 DIAGNOSIS — M86672 Other chronic osteomyelitis, left ankle and foot: Secondary | ICD-10-CM | POA: Diagnosis not present

## 2022-04-15 DIAGNOSIS — Z0189 Encounter for other specified special examinations: Secondary | ICD-10-CM | POA: Diagnosis not present

## 2022-04-15 DIAGNOSIS — R7881 Bacteremia: Secondary | ICD-10-CM | POA: Diagnosis not present

## 2022-04-15 LAB — GLUCOSE, CAPILLARY
Glucose-Capillary: 73 mg/dL (ref 70–99)
Glucose-Capillary: 96 mg/dL (ref 70–99)
Glucose-Capillary: 221 mg/dL — ABNORMAL HIGH (ref 70–99)
Glucose-Capillary: 107 mg/dL — ABNORMAL HIGH (ref 70–99)
Glucose-Capillary: 180 mg/dL — ABNORMAL HIGH (ref 70–99)

## 2022-04-15 NOTE — Progress Notes (Signed)
Progress Note    [name]   PJA:[id number]  DOB: 1935-09-09  DOA: 04/04/2022     11 PCP: [name] Honour, MD  Initial CC: Altered mentation   Hospital Course: [name] is an 86 yo female with PMH CVA, BPPV, arthritis, COPD, DM II, HTN, glaucoma, HLD, IBS, left foot osteomyelitis, spinal stenosis, CHB s/p PPM, chronic hypoxia on 3L home O2.  She originally presented with altered mentation.  She was found to have hypoglycemia and UTI.  She was started on antibiotics and underwent further workup.  Evaluation then was notable for polymicrobial bacteremia.  ID was consulted. Due to concern for lack of capacity, psychiatry was also consulted.  Patient was deemed to not have capacity regarding her decision-making ability for surgery and for going to SNF.   Interval History:  No events overnight. Doing okay as usual. She had no concerns this am.   Assessment and Plan: * Bacteremia - 1/4 bottles from admission growing E faecalis and Rothia Mucilaginosa - suspected etiology/source would be her left foot ongoing chronic wound; still weeping pus - ID followed - TTE negative; no need for TEE - abx transitioned to Augmentin for 4 weeks total and outpatient followup with ID on 04/26/22  Lacks decision making capacity - any encephalopathy from admission and infection would be considered resolved at this point - no other large electrolyte abnormalities to explain this large of an impairment -CT head performed 03/28/2022 noted with parenchymal atrophy and chronic small vessel disease; atrophy is most prominent in the frontal and temporal regions -Patient does have some risk for development of vascular dementia esp given the location of her atrophy - per my evaluation patient lacks capacity and I've also asked psychiatry to evaluate patient; seen on 12/6 and psychiatry also agrees that patient lacks capacity for refusing SNF placement as well as surgical treatment (latter of which would be  more complicated) however, this at least speaks towards her being unsafe for going home alone with no assistance/care - TOC requested for seeking guardianship  Chronic osteomyelitis of left foot ([name]) - Last CT left foot 02/24/2022.  Shows ongoing soft tissue wound overlying lateral aspect of fifth MTP joint.  Cortical irregularity of fifth metatarsal head concerning for osteomyelitis - Followed outpatient by ID on chronic antibiotic suppression - Continue on Augmentin as per ID recommendations with outpatient follow-up with ID especially as she continues to decline surgery despite lacking capacity - continue betadine wet to dry dressings BID  Sepsis without acute organ dysfunction (HCC)-resolved as of 04/12/2022 - Leukocytosis, tachycardia, source is suspected were urinary and foot wound on admission - See separate problems - Sepsis physiology resolved  Acute urinary retention-resolved as of 04/12/2022 - Now has external catheter in place  Acute cystitis without hematuria-resolved as of 04/12/2022 - Completed course for UTI  Uncontrolled type 2 diabetes mellitus with hyperglycemia ([place]) - A1c 12.9% on 04/05/2022.  Further speaks to her probable noncompliance and lack of capacity at home - Continue SSI and CBG monitoring - Continue Semglee  Constipation - Patient noted with distended abdomen although no nausea/vomiting - Last abdominal imaging from 04/08/2022 noted with moderate stool burden - Bowel regimen has been ordered.  Patient declining further laxatives  Hypomagnesemia - Replete as needed  T12 vertebral fracture ([place]) - unable to perform MRI due to pacer -Patient evaluated by neurosurgery.  T12 compression fracture felt to be related to osteoporosis; no associated pain nor retropulsion/stenosis -Brace recommended for comfort with ambulation.  No surgical  intervention recommended - Continue working with PT/OT as able  Protein-calorie malnutrition, severe - Patient's BMI is  Body mass index is 24.21 kg/m.. - Patient has the following signs/symptoms consistent with PCM: (fat loss, muscle loss, muscle wasting). - seen by RD, appreciate assistance. Continue plan per RD   Pacemaker Chronic.  Pressure injury of skin -Various wounds: I have personally evaluated and agree with the documentation of the wounds as below Pressure Injury 01/11/22 Sacrum Mid;Medial Stage 1 -  Intact skin with non-blanchable redness of a localized area usually over a bony prominence. (Active)  01/11/22 0226  Location: Sacrum  Location Orientation: Mid;Medial  Staging: Stage 1 -  Intact skin with non-blanchable redness of a localized area usually over a bony prominence.  Wound Description (Comments):   Present on Admission: Yes     Pressure Injury 04/05/22 Hip Anterior;Left;Proximal Stage 2 -  Partial thickness loss of dermis presenting as a shallow open injury with a red, pink wound bed without slough. Round 1cmx1cm pressure sore, yellow center with pink edges, dry wound bed (Active)  04/05/22 0000  Location: Hip  Location Orientation: Anterior;Left;Proximal  Staging: Stage 2 -  Partial thickness loss of dermis presenting as a shallow open injury with a red, pink wound bed without slough.  Wound Description (Comments): Round 1cmx1cm pressure sore, yellow center with pink edges, dry wound bed  Present on Admission: Yes     Pressure Injury 04/05/22 Toe (Comment  which one) Left;Lateral Unstageable - Full thickness tissue loss in which the base of the injury is covered by slough (yellow, tan, gray, green or brown) and/or eschar (tan, brown or black) in the wound bed. 2cmx1cm (Active)  04/05/22 0000  Location: Toe (Comment  which one) (outer aspect of pinky toe)  Location Orientation: Left;Lateral  Staging: Unstageable - Full thickness tissue loss in which the base of the injury is covered by slough (yellow, tan, gray, green or brown) and/or eschar (tan, brown or black) in the wound bed.   Wound Description (Comments): 2cmx1cm oval shaped wound, covered with grey/brown scabbing which has hair/fuzz? imbedded in it, dry with no drainage  Present on Admission: Yes  -C/w WOC Consult  Chronic respiratory failure with hypoxia ([place]) - On 3 L chronically   Old records reviewed in assessment of this patient  Antimicrobials: Rocephin 04/04/2022 >> 04/05/2022 Unasyn 04/05/2022 >> 04/12/2022 Augmentin 04/12/2022 >> current  DVT prophylaxis:  heparin injection 5,000 Units Start: 04/05/22 0600 SCDs Start: 04/05/22 0018   Code Status:   Code Status: DNR  Mobility Assessment (last 72 hours)     Mobility Assessment     Row Name 04/14/22 1424 04/12/22 2025 04/12/22 1701 04/12/22 1549     Does patient have an order for bedrest or is patient medically unstable -- No - Continue assessment -- --    What is the highest level of mobility based on the progressive mobility assessment? Level 3 (Stands with assist) - Balance while standing  and cannot march in place -- Level 2 ([place]) - Balance while sitting on edge of bed and cannot stand Level 3 (Stands with assist) - Balance while standing  and cannot march in place             Barriers to discharge: Guardianship needed.  Patient lacks capacity. Medically stable Disposition Plan: Unknown Status is: Inpt  Objective: Blood pressure 129/69, pulse (!) 106, temperature 97.9 F (36.6 C), temperature source Oral, resp. rate 20, height 5' 3"  (1.6 m), weight 54.3 kg, SpO2  97 %.  Examination:  Physical Exam Constitutional:      Comments: Pleasantly confused elderly woman in no distress  HENT:     Head: Normocephalic and atraumatic.     Mouth/Throat:     Mouth: Mucous membranes are moist.  Eyes:     Extraocular Movements: Extraocular movements intact.  Cardiovascular:     Rate and Rhythm: Normal rate and regular rhythm.  Pulmonary:     Effort: Pulmonary effort is normal.     Breath sounds: Normal breath sounds.  Abdominal:      General: Bowel sounds are normal. There is distension.     Palpations: Abdomen is soft.     Tenderness: There is no abdominal tenderness.  Musculoskeletal:     Cervical back: Normal range of motion.     Comments: Left foot wound covered with dressing (previously noted to be draining pus)  Skin:    General: Skin is warm and dry.  Neurological:     Mental Status: She is disoriented.      Consultants:  Podiatry Palliative Care Medicine ID Neurosurgery  Procedures:    Data Reviewed: Results for orders placed or performed during the hospital encounter of 04/04/22 (from the past 24 hour(s))  Glucose, capillary     Status: Abnormal   Collection Time: 04/14/22  4:11 PM  Result Value Ref Range   Glucose-Capillary 101 (H) 70 - 99 mg/dL  Glucose, capillary     Status: Abnormal   Collection Time: 04/14/22  8:19 PM  Result Value Ref Range   Glucose-Capillary 224 (H) 70 - 99 mg/dL  Glucose, capillary     Status: None   Collection Time: 04/15/22  7:24 AM  Result Value Ref Range   Glucose-Capillary 73 70 - 99 mg/dL  Glucose, capillary     Status: None   Collection Time: 04/15/22  7:55 AM  Result Value Ref Range   Glucose-Capillary 96 70 - 99 mg/dL  Glucose, capillary     Status: Abnormal   Collection Time: 04/15/22 11:29 AM  Result Value Ref Range   Glucose-Capillary 221 (H) 70 - 99 mg/dL   *Note: Due to a large number of results and/or encounters for the requested time period, some results have not been displayed. A complete set of results can be found in Results Review.    I have Reviewed nursing notes, Vitals, and Lab results since pt's last encounter. Pertinent lab results : see above I have ordered test including BMP, CBC, Mg I have reviewed the last note from staff over past 24 hours I have discussed pt's care plan and test results with nursing staff, case manager    LOS: 11 days   Dwyane Dee, MD [facility] 04/15/2022, 1:32 PM

## 2022-04-16 DIAGNOSIS — M86672 Other chronic osteomyelitis, left ankle and foot: Secondary | ICD-10-CM | POA: Diagnosis not present

## 2022-04-16 DIAGNOSIS — Z0189 Encounter for other specified special examinations: Secondary | ICD-10-CM | POA: Diagnosis not present

## 2022-04-16 DIAGNOSIS — R7881 Bacteremia: Secondary | ICD-10-CM | POA: Diagnosis not present

## 2022-04-16 LAB — GLUCOSE, CAPILLARY
Glucose-Capillary: 183 mg/dL — ABNORMAL HIGH (ref 70–99)
Glucose-Capillary: 299 mg/dL — ABNORMAL HIGH (ref 70–99)
Glucose-Capillary: 160 mg/dL — ABNORMAL HIGH (ref 70–99)
Glucose-Capillary: 125 mg/dL — ABNORMAL HIGH (ref 70–99)

## 2022-04-16 NOTE — Plan of Care (Signed)
  Problem: Coping: Goal: Ability to adjust to condition or change in health will improve Outcome: Progressing   Problem: Fluid Volume: Goal: Ability to maintain a balanced intake and output will improve Outcome: Progressing   Problem: Metabolic: Goal: Ability to maintain appropriate glucose levels will improve Outcome: Progressing   Problem: Skin Integrity: Goal: Risk for impaired skin integrity will decrease Outcome: Progressing

## 2022-04-16 NOTE — Progress Notes (Signed)
Progress Note    [name]   [id number]:[id number]  DOB: 10-Jul-1935  DOA: 04/04/2022     12 PCP: Wardell Honour, MD  Initial CC: Altered mentation   Hospital Course: [name] is an 86 yo female with PMH CVA, BPPV, arthritis, COPD, DM II, HTN, glaucoma, HLD, IBS, left foot osteomyelitis, spinal stenosis, CHB s/p PPM, chronic hypoxia on 3L home O2.  She originally presented with altered mentation.  She was found to have hypoglycemia and UTI.  She was started on antibiotics and underwent further workup.  Evaluation then was notable for polymicrobial bacteremia.  ID was consulted. Due to concern for lack of capacity, psychiatry was also consulted.  Patient was deemed to not have capacity regarding her decision-making ability for surgery and for going to SNF.   Interval History:  No events overnight. Still wishes she could go home.   Assessment and Plan: * Bacteremia - 1/4 bottles from admission growing E faecalis and Rothia Mucilaginosa - suspected etiology/source would be her left foot ongoing chronic wound; still weeping pus - ID followed - TTE negative; no need for TEE - abx transitioned to Augmentin for 4 weeks total and outpatient followup with ID on 04/26/22  Lacks decision making capacity - any encephalopathy from admission and infection would be considered resolved at this point - no other large electrolyte abnormalities to explain this large of an impairment -CT head performed 03/28/2022 noted with parenchymal atrophy and chronic small vessel disease; atrophy is most prominent in the frontal and temporal regions -Patient does have some risk for development of vascular dementia esp given the location of her atrophy - per my evaluation patient lacks capacity and I've also asked psychiatry to evaluate patient; seen on 12/6 and psychiatry also agrees that patient lacks capacity for refusing SNF placement as well as surgical treatment (latter of which would be more complicated)  however, this at least speaks towards her being unsafe for going home alone with no assistance/care - TOC requested for seeking guardianship  Chronic osteomyelitis of left foot ([place]) - Last CT left foot 02/24/2022.  Shows ongoing soft tissue wound overlying lateral aspect of fifth MTP joint.  Cortical irregularity of fifth metatarsal head concerning for osteomyelitis - Followed outpatient by ID on chronic antibiotic suppression - Continue on Augmentin as per ID recommendations with outpatient follow-up with ID especially as she continues to decline surgery despite lacking capacity - continue betadine wet to dry dressings BID  Sepsis without acute organ dysfunction (HCC)-resolved as of 04/12/2022 - Leukocytosis, tachycardia, source is suspected were urinary and foot wound on admission - See separate problems - Sepsis physiology resolved  Acute urinary retention-resolved as of 04/12/2022 - Now has external catheter in place  Acute cystitis without hematuria-resolved as of 04/12/2022 - Completed course for UTI  Uncontrolled type 2 diabetes mellitus with hyperglycemia ([place]) - A1c 12.9% on 04/05/2022.  Further speaks to her probable noncompliance and lack of capacity at home - Continue SSI and CBG monitoring - Continue Semglee  Constipation - Patient noted with distended abdomen although no nausea/vomiting - Last abdominal imaging from 04/08/2022 noted with moderate stool burden - Bowel regimen has been ordered.  Patient declining further laxatives  Hypomagnesemia - Replete as needed  T12 vertebral fracture ([place]) - unable to perform MRI due to pacer -Patient evaluated by neurosurgery.  T12 compression fracture felt to be related to osteoporosis; no associated pain nor retropulsion/stenosis -Brace recommended for comfort with ambulation.  No surgical intervention recommended - Continue  working with PT/OT as able  Protein-calorie malnutrition, severe - Patient's BMI is Body mass index is  24.21 kg/m.. - Patient has the following signs/symptoms consistent with PCM: (fat loss, muscle loss, muscle wasting). - seen by RD, appreciate assistance. Continue plan per RD   Pacemaker Chronic.  Pressure injury of skin -Various wounds: I have personally evaluated and agree with the documentation of the wounds as below Pressure Injury 01/11/22 Sacrum Mid;Medial Stage 1 -  Intact skin with non-blanchable redness of a localized area usually over a bony prominence. (Active)  01/11/22 0226  Location: Sacrum  Location Orientation: Mid;Medial  Staging: Stage 1 -  Intact skin with non-blanchable redness of a localized area usually over a bony prominence.  Wound Description (Comments):   Present on Admission: Yes     Pressure Injury 04/05/22 Hip Anterior;Left;Proximal Stage 2 -  Partial thickness loss of dermis presenting as a shallow open injury with a red, pink wound bed without slough. Round 1cmx1cm pressure sore, yellow center with pink edges, dry wound bed (Active)  04/05/22 0000  Location: Hip  Location Orientation: Anterior;Left;Proximal  Staging: Stage 2 -  Partial thickness loss of dermis presenting as a shallow open injury with a red, pink wound bed without slough.  Wound Description (Comments): Round 1cmx1cm pressure sore, yellow center with pink edges, dry wound bed  Present on Admission: Yes     Pressure Injury 04/05/22 Toe (Comment  which one) Left;Lateral Unstageable - Full thickness tissue loss in which the base of the injury is covered by slough (yellow, tan, gray, green or brown) and/or eschar (tan, brown or black) in the wound bed. 2cmx1cm (Active)  04/05/22 0000  Location: Toe (Comment  which one) (outer aspect of pinky toe)  Location Orientation: Left;Lateral  Staging: Unstageable - Full thickness tissue loss in which the base of the injury is covered by slough (yellow, tan, gray, green or brown) and/or eschar (tan, brown or black) in the wound bed.  Wound Description  (Comments): 2cmx1cm oval shaped wound, covered with grey/brown scabbing which has hair/fuzz? imbedded in it, dry with no drainage  Present on Admission: Yes  -C/w WOC Consult  Chronic respiratory failure with hypoxia ([place]) - On 3 L chronically   Old records reviewed in assessment of this patient  Antimicrobials: Rocephin 04/04/2022 >> 04/05/2022 Unasyn 04/05/2022 >> 04/12/2022 Augmentin 04/12/2022 >> current  DVT prophylaxis:  heparin injection 5,000 Units Start: 04/05/22 0600 SCDs Start: 04/05/22 0018   Code Status:   Code Status: DNR  Mobility Assessment (last 72 hours)     Mobility Assessment     Row Name 04/16/22 0814 04/15/22 1918 04/14/22 1424       Does patient have an order for bedrest or is patient medically unstable No - Continue assessment No - Continue assessment --     What is the highest level of mobility based on the progressive mobility assessment? Level 4 (Walks with assist in room) - Balance while marching in place and cannot step forward and back - Complete Level 4 (Walks with assist in room) - Balance while marching in place and cannot step forward and back - Complete Level 3 (Stands with assist) - Balance while standing  and cannot march in place     Is the above level different from baseline mobility prior to current illness? -- Yes - Recommend PT order --              Barriers to discharge: Guardianship needed.  Patient lacks capacity. Medically  stable Disposition Plan: Unknown Status is: Inpt  Objective: Blood pressure 113/71, pulse 93, temperature 98.7 F (37.1 C), temperature source Oral, resp. rate 18, height 5' 3"  (1.6 m), weight 54 kg, SpO2 98 %.  Examination:  Physical Exam Constitutional:      Comments: Pleasantly confused elderly woman in no distress  HENT:     Head: Normocephalic and atraumatic.     Mouth/Throat:     Mouth: Mucous membranes are moist.  Eyes:     Extraocular Movements: Extraocular movements intact.  Cardiovascular:      Rate and Rhythm: Normal rate and regular rhythm.  Pulmonary:     Effort: Pulmonary effort is normal.     Breath sounds: Normal breath sounds.  Abdominal:     General: Bowel sounds are normal. There is distension.     Palpations: Abdomen is soft.     Tenderness: There is no abdominal tenderness.  Musculoskeletal:     Cervical back: Normal range of motion.     Comments: Left foot wound showing improvement in drainage (none expressed today) and wound bed starting to heal further  Skin:    General: Skin is warm and dry.  Neurological:     Mental Status: She is disoriented.      Consultants:  Podiatry Palliative Care Medicine ID Neurosurgery  Procedures:    Data Reviewed: Results for orders placed or performed during the hospital encounter of 04/04/22 (from the past 24 hour(s))  Glucose, capillary     Status: Abnormal   Collection Time: 04/15/22  5:15 PM  Result Value Ref Range   Glucose-Capillary 107 (H) 70 - 99 mg/dL  Glucose, capillary     Status: Abnormal   Collection Time: 04/15/22  9:08 PM  Result Value Ref Range   Glucose-Capillary 180 (H) 70 - 99 mg/dL  Glucose, capillary     Status: Abnormal   Collection Time: 04/16/22  7:41 AM  Result Value Ref Range   Glucose-Capillary 183 (H) 70 - 99 mg/dL  Glucose, capillary     Status: Abnormal   Collection Time: 04/16/22 12:09 PM  Result Value Ref Range   Glucose-Capillary 299 (H) 70 - 99 mg/dL   *Note: Due to a large number of results and/or encounters for the requested time period, some results have not been displayed. A complete set of results can be found in Results Review.    I have Reviewed nursing notes, Vitals, and Lab results since pt's last encounter. Pertinent lab results : see above I have reviewed the last note from staff over past 24 hours I have discussed pt's care plan and test results with nursing staff, case manager    LOS: 12 days   Dwyane Dee, MD [facility] 04/16/2022, 12:33 PM

## 2022-04-17 DIAGNOSIS — R7881 Bacteremia: Secondary | ICD-10-CM | POA: Diagnosis not present

## 2022-04-17 DIAGNOSIS — M86672 Other chronic osteomyelitis, left ankle and foot: Secondary | ICD-10-CM | POA: Diagnosis not present

## 2022-04-17 DIAGNOSIS — N3000 Acute cystitis without hematuria: Secondary | ICD-10-CM | POA: Diagnosis not present

## 2022-04-17 DIAGNOSIS — Z0189 Encounter for other specified special examinations: Secondary | ICD-10-CM | POA: Diagnosis not present

## 2022-04-17 LAB — GLUCOSE, CAPILLARY
Glucose-Capillary: 264 mg/dL — ABNORMAL HIGH (ref 70–99)
Glucose-Capillary: 215 mg/dL — ABNORMAL HIGH (ref 70–99)
Glucose-Capillary: 272 mg/dL — ABNORMAL HIGH (ref 70–99)
Glucose-Capillary: 276 mg/dL — ABNORMAL HIGH (ref 70–99)

## 2022-04-17 NOTE — Plan of Care (Signed)
  Problem: Education: Goal: Knowledge of General Education information will improve Description Including pain rating scale, medication(s)/side effects and non-pharmacologic comfort measures Outcome: Progressing   Problem: Health Behavior/Discharge Planning: Goal: Ability to manage health-related needs will improve Outcome: Progressing   

## 2022-04-17 NOTE — TOC Progression Note (Signed)
Transition of Care [facility]) - Progression Note    Patient Details  Name: [name] MRN: [id number] Date of Birth: 05-26-1935  Transition of Care [facility]) CM/SW [place], LCSW Phone Number: 04/17/2022, 2:20 PM  Clinical Narrative:    [facility] CSW spoke with pt's APS worker Samule Ohm [phone]) and provided her with PT recommendations for skilled nursing. Ms Tamala Julian stated they are still doing their investigation and do not have an answer on whether they will be taking Guardianship.  TOC to follow.     Expected Discharge Plan: [place] Barriers to Discharge: Continued Medical Work up  Expected Discharge Plan and Services Expected Discharge Plan: [place]   Discharge Planning Services: CM Consult Post Acute Care Choice: [place] Living arrangements for the past 2 months: Apartment                                       Social Determinants of Health (SDOH) Interventions    Readmission Risk Interventions     No data to display

## 2022-04-17 NOTE — Progress Notes (Signed)
Progress Note    [name]   VXB:[id number]  DOB: Nov 06, 1935  DOA: 04/04/2022     13 PCP: [name] Honour, MD  Initial CC: Altered mentation   Hospital Course: [name] is an 86 yo female with PMH CVA, BPPV, arthritis, COPD, DM II, HTN, glaucoma, HLD, IBS, left foot osteomyelitis, spinal stenosis, CHB s/p PPM, chronic hypoxia on 3L home O2.  She originally presented with altered mentation.  She was found to have hypoglycemia and UTI.  She was started on antibiotics and underwent further workup.  Evaluation then was notable for polymicrobial bacteremia.  ID was consulted. Due to concern for lack of capacity, psychiatry was also consulted.  Patient was deemed to not have capacity regarding her decision-making ability for surgery and for going to SNF.   Interval History:  No events overnight. Still wishes she could go home. She still lacks capacity in my medical opinion for being able to adequately care for herself at home.   Assessment and Plan: * Bacteremia - 1/4 bottles from admission growing E faecalis and Rothia Mucilaginosa - suspected etiology/source would be her left foot ongoing chronic wound; still weeping pus - ID followed - TTE negative; no need for TEE - abx transitioned to Augmentin for 4 weeks total and outpatient followup with ID on 04/26/22  Lacks decision making capacity - any encephalopathy from admission and infection would be considered resolved at this point - no other large electrolyte abnormalities to explain this large of an impairment -CT head performed 03/28/2022 noted with parenchymal atrophy and chronic small vessel disease; atrophy is most prominent in the frontal and temporal regions -Patient does have some risk for development of vascular dementia esp given the location of her atrophy - per my evaluation patient lacks capacity and I've also asked psychiatry to evaluate patient; seen on 12/6 and psychiatry also agrees that patient lacks  capacity for refusing SNF placement as well as surgical treatment (latter of which would be more complicated) however, this at least speaks towards her being unsafe for going home alone with no assistance/care - TOC requested for seeking guardianship; APS worker informs they are still performing investigation to decide on guardianship are not  Chronic osteomyelitis of left foot ([place]) - Last CT left foot 02/24/2022.  Shows ongoing soft tissue wound overlying lateral aspect of fifth MTP joint.  Cortical irregularity of fifth metatarsal head concerning for osteomyelitis - Followed outpatient by ID on chronic antibiotic suppression - Continue on Augmentin as per ID recommendations with outpatient follow-up with ID especially as she continues to decline surgery despite lacking capacity - continue betadine wet to dry dressings BID  Sepsis without acute organ dysfunction (HCC)-resolved as of 04/12/2022 - Leukocytosis, tachycardia, source is suspected were urinary and foot wound on admission - See separate problems - Sepsis physiology resolved  Acute urinary retention-resolved as of 04/12/2022 - Now has external catheter in place  Acute cystitis without hematuria-resolved as of 04/12/2022 - Completed course for UTI  Uncontrolled type 2 diabetes mellitus with hyperglycemia ([place]) - A1c 12.9% on 04/05/2022.  Further speaks to her probable noncompliance and lack of capacity at home - Continue SSI and CBG monitoring - Continue Semglee  Constipation - Patient noted with distended abdomen although no nausea/vomiting - Last abdominal imaging from 04/08/2022 noted with moderate stool burden - Bowel regimen has been ordered.  Patient declining further laxatives  Hypomagnesemia - Replete as needed  T12 vertebral fracture ([place]) - unable to perform MRI due to pacer -  Patient evaluated by neurosurgery.  T12 compression fracture felt to be related to osteoporosis; no associated pain nor  retropulsion/stenosis -Brace recommended for comfort with ambulation.  No surgical intervention recommended - Continue working with PT/OT as able  Protein-calorie malnutrition, severe - Patient's BMI is Body mass index is 24.21 kg/m.. - Patient has the following signs/symptoms consistent with PCM: (fat loss, muscle loss, muscle wasting). - seen by RD, appreciate assistance. Continue plan per RD   Pacemaker Chronic.  Pressure injury of skin -Various wounds: I have personally evaluated and agree with the documentation of the wounds as below Pressure Injury 01/11/22 Sacrum Mid;Medial Stage 1 -  Intact skin with non-blanchable redness of a localized area usually over a bony prominence. (Active)  01/11/22 0226  Location: Sacrum  Location Orientation: Mid;Medial  Staging: Stage 1 -  Intact skin with non-blanchable redness of a localized area usually over a bony prominence.  Wound Description (Comments):   Present on Admission: Yes     Pressure Injury 04/05/22 Hip Anterior;Left;Proximal Stage 2 -  Partial thickness loss of dermis presenting as a shallow open injury with a red, pink wound bed without slough. Round 1cmx1cm pressure sore, yellow center with pink edges, dry wound bed (Active)  04/05/22 0000  Location: Hip  Location Orientation: Anterior;Left;Proximal  Staging: Stage 2 -  Partial thickness loss of dermis presenting as a shallow open injury with a red, pink wound bed without slough.  Wound Description (Comments): Round 1cmx1cm pressure sore, yellow center with pink edges, dry wound bed  Present on Admission: Yes     Pressure Injury 04/05/22 Toe (Comment  which one) Left;Lateral Unstageable - Full thickness tissue loss in which the base of the injury is covered by slough (yellow, tan, gray, green or brown) and/or eschar (tan, brown or black) in the wound bed. 2cmx1cm (Active)  04/05/22 0000  Location: Toe (Comment  which one) (outer aspect of pinky toe)  Location Orientation:  Left;Lateral  Staging: Unstageable - Full thickness tissue loss in which the base of the injury is covered by slough (yellow, tan, gray, green or brown) and/or eschar (tan, brown or black) in the wound bed.  Wound Description (Comments): 2cmx1cm oval shaped wound, covered with grey/brown scabbing which has hair/fuzz? imbedded in it, dry with no drainage  Present on Admission: Yes  -C/w WOC Consult  Chronic respiratory failure with hypoxia ([place]) - On 3 L chronically   Old records reviewed in assessment of this patient  Antimicrobials: Rocephin 04/04/2022 >> 04/05/2022 Unasyn 04/05/2022 >> 04/12/2022 Augmentin 04/12/2022 >> current  DVT prophylaxis:  heparin injection 5,000 Units Start: 04/05/22 0600 SCDs Start: 04/05/22 0018   Code Status:   Code Status: DNR  Mobility Assessment (last 72 hours)     Mobility Assessment     Row Name 04/17/22 0911 04/16/22 2102 04/16/22 2043 04/16/22 0814 04/15/22 1918   Does patient have an order for bedrest or is patient medically unstable No - Continue assessment No - Continue assessment No - Continue assessment No - Continue assessment No - Continue assessment   What is the highest level of mobility based on the progressive mobility assessment? Level 4 (Walks with assist in room) - Balance while marching in place and cannot step forward and back - Complete Level 4 (Walks with assist in room) - Balance while marching in place and cannot step forward and back - Complete Level 3 (Stands with assist) - Balance while standing  and cannot march in place Level 4 (Walks with assist  in room) - Balance while marching in place and cannot step forward and back - Complete Level 4 (Walks with assist in room) - Balance while marching in place and cannot step forward and back - Complete   Is the above level different from baseline mobility prior to current illness? Yes - Recommend PT order Yes - Recommend PT order Yes - Recommend PT order -- Yes - Recommend PT order             Barriers to discharge: Guardianship needed.  Patient lacks capacity. Medically stable Disposition Plan: Unknown Status is: Inpt  Objective: Blood pressure 118/71, pulse (!) 104, temperature 98.7 F (37.1 C), temperature source Oral, resp. rate 18, height 5' 3"  (1.6 m), weight 53.6 kg, SpO2 98 %.  Examination:  Physical Exam Constitutional:      Comments: Pleasantly confused elderly woman in no distress  HENT:     Head: Normocephalic and atraumatic.     Mouth/Throat:     Mouth: Mucous membranes are moist.  Eyes:     Extraocular Movements: Extraocular movements intact.  Cardiovascular:     Rate and Rhythm: Normal rate and regular rhythm.  Pulmonary:     Effort: Pulmonary effort is normal.     Breath sounds: Normal breath sounds.  Abdominal:     General: Bowel sounds are normal. There is distension.     Palpations: Abdomen is soft.     Tenderness: There is no abdominal tenderness.  Musculoskeletal:     Cervical back: Normal range of motion.     Comments: Left foot wound showing improvement in drainage (none expressed today) and wound bed starting to heal further  Skin:    General: Skin is warm and dry.  Neurological:     Mental Status: She is disoriented.      Consultants:  Podiatry Palliative Care Medicine ID Neurosurgery  Procedures:    Data Reviewed: Results for orders placed or performed during the hospital encounter of 04/04/22 (from the past 24 hour(s))  Glucose, capillary     Status: Abnormal   Collection Time: 04/16/22  8:22 PM  Result Value Ref Range   Glucose-Capillary 125 (H) 70 - 99 mg/dL  Glucose, capillary     Status: Abnormal   Collection Time: 04/17/22  7:19 AM  Result Value Ref Range   Glucose-Capillary 264 (H) 70 - 99 mg/dL  Glucose, capillary     Status: Abnormal   Collection Time: 04/17/22 11:26 AM  Result Value Ref Range   Glucose-Capillary 215 (H) 70 - 99 mg/dL  Glucose, capillary     Status: Abnormal   Collection Time:  04/17/22  4:15 PM  Result Value Ref Range   Glucose-Capillary 272 (H) 70 - 99 mg/dL   *Note: Due to a large number of results and/or encounters for the requested time period, some results have not been displayed. A complete set of results can be found in Results Review.    I have Reviewed nursing notes, Vitals, and Lab results since pt's last encounter. Pertinent lab results : see above I have reviewed the last note from staff over past 24 hours I have discussed pt's care plan and test results with nursing staff, case manager    LOS: 13 days   Dwyane Dee, MD [facility] 04/17/2022, 4:32 PM

## 2022-04-18 DIAGNOSIS — Z0189 Encounter for other specified special examinations: Secondary | ICD-10-CM | POA: Diagnosis not present

## 2022-04-18 DIAGNOSIS — M86672 Other chronic osteomyelitis, left ankle and foot: Secondary | ICD-10-CM | POA: Diagnosis not present

## 2022-04-18 DIAGNOSIS — R7881 Bacteremia: Secondary | ICD-10-CM | POA: Diagnosis not present

## 2022-04-18 LAB — GLUCOSE, CAPILLARY
Glucose-Capillary: 186 mg/dL — ABNORMAL HIGH (ref 70–99)
Glucose-Capillary: 215 mg/dL — ABNORMAL HIGH (ref 70–99)
Glucose-Capillary: 253 mg/dL — ABNORMAL HIGH (ref 70–99)
Glucose-Capillary: 105 mg/dL — ABNORMAL HIGH (ref 70–99)

## 2022-04-18 MED ORDER — PROSOURCE PLUS PO LIQD
30.0000 mL | Freq: Two times a day (BID) | ORAL | Status: DC
  Administered 2022-04-18 – 2022-04-26 (×5): 30 mL via ORAL
  Filled 2022-04-18 (×9): qty 30

## 2022-04-18 MED ORDER — INSULIN GLARGINE-YFGN 100 UNIT/ML ~~LOC~~ SOLN
18.0000 [IU] | Freq: Every day | SUBCUTANEOUS | Status: DC
  Administered 2022-04-18 – 2022-04-26 (×9): 18 [IU] via SUBCUTANEOUS
  Filled 2022-04-18 (×10): qty 0.18

## 2022-04-18 NOTE — Progress Notes (Signed)
OT Cancellation Note  Patient Details Name: [name] MRN: [id number] DOB: 07-09-1935   Cancelled Treatment:    Reason Eval/Treat Not Completed: Patient declined, no reason specified Patient was approached x2 on this date with patient reporting she did not want to do therapy. Patient when educated on participation in tasks patient reported " stop badgering me". Patient was educated MD wanted patient to move. Patient said :" tell him I refuse". OT to continue to follow and check back as schedule will allow.  Rennie Plowman, MS Acute Rehabilitation Department Office# [phone]  04/18/2022, 1:21 PM

## 2022-04-18 NOTE — Plan of Care (Signed)
  Problem: Education: Goal: Knowledge of General Education information will improve Description Including pain rating scale, medication(s)/side effects and non-pharmacologic comfort measures Outcome: Progressing   Problem: Health Behavior/Discharge Planning: Goal: Ability to manage health-related needs will improve Outcome: Progressing   

## 2022-04-18 NOTE — Progress Notes (Signed)
Progress Note    [name]   HQI:[id number]  DOB: December 28, 1935  DOA: 04/04/2022     14 PCP: Wardell Honour, MD  Initial CC: Altered mentation   Hospital Course: [name] is an 86 yo female with PMH CVA, BPPV, arthritis, COPD, DM II, HTN, glaucoma, HLD, IBS, left foot osteomyelitis, spinal stenosis, CHB s/p PPM, chronic hypoxia on 3L home O2.  She originally presented with altered mentation.  She was found to have hypoglycemia and UTI.  She was started on antibiotics and underwent further workup.  Evaluation then was notable for polymicrobial bacteremia.  ID was consulted. Due to concern for lack of capacity, psychiatry was also consulted.  Patient was deemed to not have capacity regarding her decision-making ability for surgery and for going to SNF.   Interval History:  No events overnight. Still wishes she could go home. She still lacks capacity in my medical opinion for being able to adequately care for herself at home.  OT tried to work with her today and she apparently refused unfortunately.   Assessment and Plan: * Bacteremia - 1/4 bottles from admission growing E faecalis and Rothia Mucilaginosa - suspected etiology/source would be her left foot ongoing chronic wound; still weeping pus - ID followed - TTE negative; no need for TEE - abx transitioned to Augmentin for 4 weeks total and outpatient followup with ID on 04/26/22  Lacks decision making capacity - any encephalopathy from admission and infection would be considered resolved at this point - no other large electrolyte abnormalities to explain this large of an impairment -CT head performed 03/28/2022 noted with parenchymal atrophy and chronic small vessel disease; atrophy is most prominent in the frontal and temporal regions -Patient does have some risk for development of vascular dementia esp given the location of her atrophy - per my evaluation patient lacks capacity and I've also asked psychiatry to evaluate  patient; seen on 12/6 and psychiatry also agrees that patient lacks capacity for refusing SNF placement as well as surgical treatment (latter of which would be more complicated) however, this at least speaks towards her being unsafe for going home alone with no assistance/care - TOC requested for seeking guardianship; DSS has agreed to move forward with protective order and tentatively planned for completing paperwork by the weekend or shortly after  Chronic osteomyelitis of left foot ([place]) - Last CT left foot 02/24/2022.  Shows ongoing soft tissue wound overlying lateral aspect of fifth MTP joint.  Cortical irregularity of fifth metatarsal head concerning for osteomyelitis - Followed outpatient by ID on chronic antibiotic suppression - Continue on Augmentin as per ID recommendations with outpatient follow-up with ID especially as she continues to decline surgery despite lacking capacity - continue betadine wet to dry dressings BID  Sepsis without acute organ dysfunction (HCC)-resolved as of 04/12/2022 - Leukocytosis, tachycardia, source is suspected were urinary and foot wound on admission - See separate problems - Sepsis physiology resolved  Acute urinary retention-resolved as of 04/12/2022 - Now has external catheter in place  Acute cystitis without hematuria-resolved as of 04/12/2022 - Completed course for UTI  Uncontrolled type 2 diabetes mellitus with hyperglycemia ([place]) - A1c 12.9% on 04/05/2022.  Further speaks to her probable noncompliance and lack of capacity at home - Continue SSI and CBG monitoring - Continue Semglee  Constipation - Patient noted with distended abdomen although no nausea/vomiting - Last abdominal imaging from 04/08/2022 noted with moderate stool burden - Bowel regimen has been ordered.  Patient declining further  laxatives  Hypomagnesemia - Replete as needed  T12 vertebral fracture (HCC) - unable to perform MRI due to pacer -Patient evaluated by  neurosurgery.  T12 compression fracture felt to be related to osteoporosis; no associated pain nor retropulsion/stenosis -Brace recommended for comfort with ambulation.  No surgical intervention recommended - Continue working with PT/OT as able  Protein-calorie malnutrition, severe - Patient's BMI is Body mass index is 24.21 kg/m.. - Patient has the following signs/symptoms consistent with PCM: (fat loss, muscle loss, muscle wasting). - seen by RD, appreciate assistance. Continue plan per RD   Pacemaker Chronic.  Pressure injury of skin -Various wounds: I have personally evaluated and agree with the documentation of the wounds as below Pressure Injury 01/11/22 Sacrum Mid;Medial Stage 1 -  Intact skin with non-blanchable redness of a localized area usually over a bony prominence. (Active)  01/11/22 0226  Location: Sacrum  Location Orientation: Mid;Medial  Staging: Stage 1 -  Intact skin with non-blanchable redness of a localized area usually over a bony prominence.  Wound Description (Comments):   Present on Admission: Yes     Pressure Injury 04/05/22 Hip Anterior;Left;Proximal Stage 2 -  Partial thickness loss of dermis presenting as a shallow open injury with a red, pink wound bed without slough. Round 1cmx1cm pressure sore, yellow center with pink edges, dry wound bed (Active)  04/05/22 0000  Location: Hip  Location Orientation: Anterior;Left;Proximal  Staging: Stage 2 -  Partial thickness loss of dermis presenting as a shallow open injury with a red, pink wound bed without slough.  Wound Description (Comments): Round 1cmx1cm pressure sore, yellow center with pink edges, dry wound bed  Present on Admission: Yes     Pressure Injury 04/05/22 Toe (Comment  which one) Left;Lateral Unstageable - Full thickness tissue loss in which the base of the injury is covered by slough (yellow, tan, gray, green or brown) and/or eschar (tan, brown or black) in the wound bed. 2cmx1cm (Active)   04/05/22 0000  Location: Toe (Comment  which one) (outer aspect of pinky toe)  Location Orientation: Left;Lateral  Staging: Unstageable - Full thickness tissue loss in which the base of the injury is covered by slough (yellow, tan, gray, green or brown) and/or eschar (tan, brown or black) in the wound bed.  Wound Description (Comments): 2cmx1cm oval shaped wound, covered with grey/brown scabbing which has hair/fuzz? imbedded in it, dry with no drainage  Present on Admission: Yes  -C/w WOC Consult  Chronic respiratory failure with hypoxia ([place]) - On 3 L chronically   Old records reviewed in assessment of this patient  Antimicrobials: Rocephin 04/04/2022 >> 04/05/2022 Unasyn 04/05/2022 >> 04/12/2022 Augmentin 04/12/2022 >> current  DVT prophylaxis:  heparin injection 5,000 Units Start: 04/05/22 0600 SCDs Start: 04/05/22 0018   Code Status:   Code Status: DNR  Mobility Assessment (last 72 hours)     Mobility Assessment     Row Name 04/17/22 2023 04/17/22 2021 04/17/22 0911 04/16/22 2102 04/16/22 2043   Does patient have an order for bedrest or is patient medically unstable No - Continue assessment No - Continue assessment No - Continue assessment No - Continue assessment No - Continue assessment   What is the highest level of mobility based on the progressive mobility assessment? Level 4 (Walks with assist in room) - Balance while marching in place and cannot step forward and back - Complete Level 3 (Stands with assist) - Balance while standing  and cannot march in place Level 4 (Walks with assist in  room) - Balance while marching in place and cannot step forward and back - Complete Level 4 (Walks with assist in room) - Balance while marching in place and cannot step forward and back - Complete Level 3 (Stands with assist) - Balance while standing  and cannot march in place   Is the above level different from baseline mobility prior to current illness? Yes - Recommend PT order Yes -  Recommend PT order Yes - Recommend PT order Yes - Recommend PT order Yes - Recommend PT order    [place] Name 04/16/22 0814 04/15/22 1918         Does patient have an order for bedrest or is patient medically unstable No - Continue assessment No - Continue assessment      What is the highest level of mobility based on the progressive mobility assessment? Level 4 (Walks with assist in room) - Balance while marching in place and cannot step forward and back - Complete Level 4 (Walks with assist in room) - Balance while marching in place and cannot step forward and back - Complete      Is the above level different from baseline mobility prior to current illness? -- Yes - Recommend PT order               Barriers to discharge: Guardianship needed.  Patient lacks capacity. Medically stable Disposition Plan: Unknown Status is: Inpt  Objective: Blood pressure 123/60, pulse (!) 108, temperature 98.9 F (37.2 C), temperature source Oral, resp. rate 19, height 5' 3"  (1.6 m), weight 53.9 kg, SpO2 96 %.  Examination:  Physical Exam Constitutional:      Comments: Pleasantly confused elderly woman in no distress  HENT:     Head: Normocephalic and atraumatic.     Mouth/Throat:     Mouth: Mucous membranes are moist.  Eyes:     Extraocular Movements: Extraocular movements intact.  Cardiovascular:     Rate and Rhythm: Normal rate and regular rhythm.  Pulmonary:     Effort: Pulmonary effort is normal.     Breath sounds: Normal breath sounds.  Abdominal:     General: Bowel sounds are normal. There is distension.     Palpations: Abdomen is soft.     Tenderness: There is no abdominal tenderness.  Musculoskeletal:     Cervical back: Normal range of motion.     Comments: Left foot wound showing improvement in drainage (none expressed today) and wound bed starting to heal further  Skin:    General: Skin is warm and dry.  Neurological:     Mental Status: She is disoriented.      Consultants:   Podiatry Palliative Care Medicine ID Neurosurgery  Procedures:    Data Reviewed: Results for orders placed or performed during the hospital encounter of 04/04/22 (from the past 24 hour(s))  Glucose, capillary     Status: Abnormal   Collection Time: 04/17/22  4:15 PM  Result Value Ref Range   Glucose-Capillary 272 (H) 70 - 99 mg/dL  Glucose, capillary     Status: Abnormal   Collection Time: 04/17/22  8:11 PM  Result Value Ref Range   Glucose-Capillary 276 (H) 70 - 99 mg/dL  Glucose, capillary     Status: Abnormal   Collection Time: 04/18/22  7:32 AM  Result Value Ref Range   Glucose-Capillary 186 (H) 70 - 99 mg/dL  Glucose, capillary     Status: Abnormal   Collection Time: 04/18/22 11:36 AM  Result Value Ref Range  Glucose-Capillary 215 (H) 70 - 99 mg/dL   *Note: Due to a large number of results and/or encounters for the requested time period, some results have not been displayed. A complete set of results can be found in Results Review.    I have Reviewed nursing notes, Vitals, and Lab results since pt's last encounter. Pertinent lab results : see above I have reviewed the last note from staff over past 24 hours I have discussed pt's care plan and test results with nursing staff, case manager    LOS: 14 days   Dwyane Dee, MD [facility] 04/18/2022, 3:17 PM

## 2022-04-18 NOTE — Progress Notes (Signed)
Nutrition Follow-up  DOCUMENTATION CODES:   Severe malnutrition in context of chronic illness  INTERVENTION:  - Continue DYS 3 diet as medically appropriate.  - Will order Prosource Plus BID, each contains 100 calories and 15g of protein in only 64m containers. - Will order Mighty Shake with breakfast, provides 330 kcals and 9 grams of protein - Will order Magic Cup with dinner, provides 290 kcal and 9 grams of protein - Encourage intake at all meals and of supplements.  - Monitor for glycemic control. - Monitor weights daily to trend.    NUTRITION DIAGNOSIS:   Severe Malnutrition related to chronic illness as evidenced by severe fat depletion, severe muscle depletion, percent weight loss. *ongoing  GOAL:   Patient will meet greater than or equal to 90% of their needs *progressing  MONITOR:   PO intake, Supplement acceptance, Weight trends  REASON FOR ASSESSMENT:   Malnutrition Screening Tool    ASSESSMENT:   86yo WF with hx of uncontrolled type 2 DM, history of complete heart block status post pacemaker, chronic protein calorie malnutrition, chronic respiratory failure on home oxygen to 3 L admitted acute cystitis without hematuria, T12 vertebral fracture, sepsis, and acute metabolic encephalopathy.  Patient reports her appetite is good and she has been trying to eat 3 meals a day but admits her intake has decreased over the past week due to growing tired of available food. Despite this, patient is documented to be consuming an average of 83% of meals in the past week.  Unfortantely, she has not been consuming her supplements as she notes they are just too sweet. Pt state she loves Ensure but it is too sweet for her right now. Thankfully, she is agreeable to try other nutrition supplements to support intake.  Patient's most recent weight taken consistent with initial admission weight.   Medications reviewed and include: Insulin, Miralax, Senokot  Labs reviewed:  None  taken since 12/6 HA1C 12.9 Blood Glucose 186-276 x24 hours  Diet Order:   Diet Order             DIET DYS 3 Room service appropriate? Yes; Fluid consistency: Thin  Diet effective now                   EDUCATION NEEDS:  No education needs have been identified at this time  Skin:  Skin Assessment: Reviewed RN Assessment Skin Integrity Issues:: Stage II, Unstageable, Stage I Stage I: Mid Sacrum Stage II: L Hip Unstageable: L Toe  Last BM:  12/11  Height:  Ht Readings from Last 1 Encounters:  04/05/22 5' 3"  (1.6 m)   Weight:  Wt Readings from Last 1 Encounters:  04/18/22 53.9 kg   BMI:  Body mass index is 21.07 kg/m.  Estimated Nutritional Needs:  Kcal:  1650-1900 kcal Protein:  70-95 grams Fluid:  >/= 1.6L    ASamson FredericRD, LDN For contact information, refer to [facility]

## 2022-04-18 NOTE — TOC Progression Note (Signed)
Transition of Care [facility]) - Progression Note    Patient Details  Name: [name] MRN: [id number] Date of Birth: 05-14-1935  [facility] CM/SW Contact  Joaquin Courts, RN Phone Number: 04/18/2022, 1:03 PM  Clinical Narrative:     [name] Hospital supervisor spoke with DSS CSW Samule Ohm.  TOC provided update that several facilities have been identified but t this time TOC is unable to move forward with facility selection as there is not surrogate decision maker in place and per MD notes patient lacks capacity.   Samule Ohm reports that she has staffed the case with her leadership and at this time DSS has determined that they will move forward with a protective order for this patient, Reeves Forth shares that she will be working on the petition and anticipates having all paperwork completed for filing by wither Friday of this week of Monday of next week.  This CM provided my direct contact number for updated on when the paperwork has been filed with the court.  TOC is continuing to follow.  Expected Discharge Plan: [place] Barriers to Discharge: Continued Medical Work up  Expected Discharge Plan and Services Expected Discharge Plan: [place]   Discharge Planning Services: CM Consult Post Acute Care Choice: [place] Living arrangements for the past 2 months: Apartment                                       Social Determinants of Health (SDOH) Interventions    Readmission Risk Interventions     No data to display

## 2022-04-19 DIAGNOSIS — R7881 Bacteremia: Secondary | ICD-10-CM | POA: Diagnosis not present

## 2022-04-19 LAB — CBC
HCT: 40.3 % (ref 36.0–46.0)
Hemoglobin: 12.4 g/dL (ref 12.0–15.0)
MCH: 27 pg (ref 26.0–34.0)
MCHC: 30.8 g/dL (ref 30.0–36.0)
MCV: 87.6 fL (ref 80.0–100.0)
Platelets: 253 10*3/uL (ref 150–400)
RBC: 4.6 MIL/uL (ref 3.87–5.11)
RDW: 14.7 % (ref 11.5–15.5)
WBC: 7.7 10*3/uL (ref 4.0–10.5)
nRBC: 0 % (ref 0.0–0.2)

## 2022-04-19 LAB — BASIC METABOLIC PANEL
Anion gap: 8 (ref 5–15)
BUN: 14 mg/dL (ref 8–23)
CO2: 29 mmol/L (ref 22–32)
Calcium: 8.8 mg/dL — ABNORMAL LOW (ref 8.9–10.3)
Chloride: 92 mmol/L — ABNORMAL LOW (ref 98–111)
Creatinine, Ser: 0.59 mg/dL (ref 0.44–1.00)
GFR, Estimated: >60 mL/min (ref >60)
Glucose, Bld: 142 mg/dL — ABNORMAL HIGH (ref 70–99)
Potassium: 5.1 mmol/L (ref 3.5–5.1)
Sodium: 129 mmol/L — ABNORMAL LOW (ref 135–145)

## 2022-04-19 LAB — GLUCOSE, CAPILLARY
Glucose-Capillary: 140 mg/dL — ABNORMAL HIGH (ref 70–99)
Glucose-Capillary: 242 mg/dL — ABNORMAL HIGH (ref 70–99)
Glucose-Capillary: 181 mg/dL — ABNORMAL HIGH (ref 70–99)

## 2022-04-19 MED ORDER — BISACODYL 10 MG RE SUPP
10.0000 mg | Freq: Once | RECTAL | Status: AC
  Administered 2022-04-19: 10 mg via RECTAL
  Filled 2022-04-19: qty 1

## 2022-04-19 NOTE — Progress Notes (Signed)
OT Cancellation Note  Patient Details Name: [name] MRN: [id number] DOB: March 08, 1936   Cancelled Treatment:    Reason Eval/Treat Not Completed: Other (comment). Declined therapy stating she was nauseous.   Donte Kary L Jaryn Rosko 04/19/2022, 11:15 AM

## 2022-04-19 NOTE — Progress Notes (Signed)
Physical Therapy Treatment Patient Details Name: [name] MRN: [id number] DOB: Jan 16, 1936 Today's Date: 04/19/2022   History of Present Illness The patient is a 86 year old female with history of diabetes mellitus type 2, complete heart block status post pacemaker, chronic hypoxic respiratory failure on home oxygen at 3 L/min, COPD, unspecified CVA, hypertension, hyperlipidemia, thrombocytopenia, chronic wounds including left metatarsal head osteomyelitis. She was found to have a UTI. She was also found to have T12 vertebral fracture.  Surgery felt that she has a T12 osteoporotic compression fracture with associated pain with no retropulsion or canal stenosis.  There is nothing to suggest osteomyelitis or discitis and they are recommending her be treated with bracing for pain control    PT Comments    Pt initially declined participation due to just waking up. She was eventually agreeable to bed level ROM exercises which she tolerated well. Pt stated "I just don't like to exercise." Will continue to follow and work with patient as she will allow. Continue to recommend SNF.     Recommendations for follow up therapy are one component of a multi-disciplinary discharge planning process, led by the attending physician.  Recommendations may be updated based on patient status, additional functional criteria and insurance authorization.  Follow Up Recommendations  Skilled nursing-short term rehab (<3 hours/day) Can patient physically be transported by private vehicle: No   Assistance Recommended at Discharge Frequent or constant Supervision/Assistance  Patient can return home with the following A lot of help with walking and/or transfers;Assistance with cooking/housework;Assist for transportation;Help with stairs or ramp for entrance;A lot of help with bathing/dressing/bathroom   Equipment Recommendations  None recommended by PT    Recommendations for Other Services       Precautions /  Restrictions Precautions Precautions: Fall;Back Precaution Comments: No formal back precautions Restrictions Weight Bearing Restrictions: No Other Position/Activity Restrictions: No brace ordered     Mobility  Bed Mobility               General bed mobility comments: NT-pt declined due to just waking up. She did agree to bed level ROM exercises    Transfers                        Ambulation/Gait                   Stairs             Wheelchair Mobility    Modified Rankin (Stroke Patients Only)       Balance                                            Cognition Arousal/Alertness: Awake/alert Behavior During Therapy: WFL for tasks assessed/performed Overall Cognitive Status: No family/caregiver present to determine baseline cognitive functioning Area of Impairment: Safety/judgement                         Safety/Judgement: Decreased awareness of deficits, Decreased awareness of safety     General Comments: able to follow 1 step commands consistently        Exercises General Exercises - Lower Extremity Ankle Circles/Pumps: AROM, Both, 10 reps Quad Sets: AROM, Both, 10 reps Short Arc Quad: AROM, Both, 5 reps Heel Slides: AROM, Both, 5 reps Hip ABduction/ADduction: AROM, Both, 5 reps    General Comments  Pertinent Vitals/Pain Pain Assessment Pain Assessment: Faces Faces Pain Scale: Hurts little more Pain Location: toe on L foot Pain Descriptors / Indicators: Discomfort, Tender Pain Intervention(s): Limited activity within patient's tolerance    Home Living                          Prior Function            PT Goals (current goals can now be found in the care plan section) Progress towards PT goals: Progressing toward goals    Frequency    Min 2X/week      PT Plan Current plan remains appropriate    Co-evaluation              AM-PAC PT "6 Clicks" Mobility    Outcome Measure  Help needed turning from your back to your side while in a flat bed without using bedrails?: A Lot Help needed moving from lying on your back to sitting on the side of a flat bed without using bedrails?: A Lot Help needed moving to and from a bed to a chair (including a wheelchair)?: A Lot Help needed standing up from a chair using your arms (e.g., wheelchair or bedside chair)?: A Lot Help needed to walk in hospital room?: Total Help needed climbing 3-5 steps with a railing? : Total 6 Click Score: 10    End of Session   Activity Tolerance: Patient limited by fatigue Patient left: in bed;with call bell/phone within reach   PT Visit Diagnosis: Difficulty in walking, not elsewhere classified (R26.2);Muscle weakness (generalized) (M62.81)     Time: 1010-1025 PT Time Calculation (min) (ACUTE ONLY): 15 min  Charges:  $Therapeutic Exercise: 8-22 mins                         Doreatha Massed, PT Acute Rehabilitation  Office: [phone]

## 2022-04-19 NOTE — Plan of Care (Signed)
  Problem: Fluid Volume: Goal: Ability to maintain a balanced intake and output will improve Outcome: Progressing  Problem: Education: Goal: Ability to describe self-care measures that may prevent or decrease complications (Diabetes Survival Skills Education) will improve Outcome: Not Progressing   Problem: Coping: Goal: Ability to adjust to condition or change in health will improve Outcome: Not Progressing    Pt is unable to understand information. All education must be provided with simple instructions during interventions

## 2022-04-19 NOTE — Progress Notes (Signed)
Pt is not keeping purwick in place. Removed, even after peri care & changing. Recently provided complete peri care, removed purwick & covered all of peri area with moisture barrier. Informed NT no more purwicks or sacral dressings, which become saturated. Placed a 2nd pad lengthwise, & pulled between legs. Also covered heels bilaterally with mepilex.

## 2022-04-19 NOTE — Progress Notes (Addendum)
PROGRESS NOTE  KATRINIA STRAKER  GLO:[id number] DOB: June 20, 1935 DOA: 04/04/2022 PCP: Wardell Honour, MD   Brief Narrative: Patient is 86 year old female with history of CVA, BPPV, arthritis, COPD, diabetes type 2, hypertension, glaucoma , HLD, IBS, left foot osteomyelitis, spinal stenosis, CHB s/p PPM, chronic hypoxia on 3L home O2 who initially presented with altered mental status.  On presentation ,she was hypoglycemic and suspected to have UTI.  Started on antibiotics.  Her blood cultures were Enterococcus faecalis, Rothia Mucilaginosa.  ID was consulted.  Repeat blood cultures have been negative.  Due to lack of capacity, psychiatry was also consulted.  Deemed not to have capacity regarding her decision-making ability.  Plan for skilled nursing facility.  Waiting for placement.TOC following  Assessment & Plan:  Principal Problem:   Bacteremia Active Problems:   Lacks decision making capacity   Chronic osteomyelitis of left foot ([place])   Uncontrolled type 2 diabetes mellitus with hyperglycemia (HCC)   Chronic respiratory failure with hypoxia (HCC)   Pressure injury of skin   Pacemaker   Protein-calorie malnutrition, severe   T12 vertebral fracture (HCC)   Hypomagnesemia   Constipation   Bacteremia: One of the bottles showed above organisms.  Etiology could be her left foot chronic wound.  ID was following.  TTE negative, no need for TEE.  ID recommended to continue Augmentin for 4 weeks and outpatient follow-up with ID in 04/26/2022  Chronic left foot osteomyelitis: CT of left foot showed soft tissue wound overlying lateral aspect of fifth metatarsophalangeal joint.  Also showed cortical irregularity of the fifth metatarsal head concerning for osteomyelitis.  Patient was  being followed by ID and was on chronic antibiotic suppression.  Continue Augmentin for now.  Follow-up with ID as an outpatient.  Patient continues to decline surgery, has lack of capacity.  Continue Betadine  wet-to-dry dressing twice daily.  Sepsis without acute organ dysfunction: Resolved.  Initially presented with leukocytosis, tachycardia.  Acute urinary retention: Foley was placed.  Acute cystitis without hematuria: Resolved.  Completed course of antibiotic for UTI.  Urine cultures are negative.  Diabetes type 2 with hyperglycemia: A1c of 12.9.  Likely from noncompliance, lack of capacity.  Continue current insulin regimen.  Monitor blood sugars  Constipation: Continue bowel regimen  T2 vertebral fracture: Unable to perform MRI due to presence of pacemaker.  Patient evaluated  by neurosurgery.  Compression fraction felt to be related to osteoporosis, no associated pain.  Recommended brace for comfort with ambulation.  No surgical intervention recommended.  Continue to work with PT/OT  Hyponatremia: Her sodium level is always borderline low. Continue monitoring  Severe protein calorie moderation: BMI of 21  Patient is status post pacemaker  Chronic respiratory failure with hypoxia: On 3 L of oxygen chronically.  Debility/deconditioning/disposition: Patient from home.  PT/OT recommending SNF.  She declines to go to SNF.  Does not have capacity.  DDS involved.TOC following  Pressure Injury 01/11/22 Sacrum Mid;Medial Stage 1 -  Intact skin with non-blanchable redness of a localized area usually over a bony prominence. (Active)  01/11/22 0226  Location: Sacrum  Location Orientation: Mid;Medial  Staging: Stage 1 -  Intact skin with non-blanchable redness of a localized area usually over a bony prominence.  Wound Description (Comments):   Present on Admission: Yes     Pressure Injury 04/05/22 Hip Anterior;Left;Proximal Stage 2 -  Partial thickness loss of dermis presenting as a shallow open injury with a red, pink wound bed without slough. Round 1cmx1cm pressure sore,  yellow center with pink edges, dry wound bed (Active)  04/05/22 0000  Location: Hip  Location Orientation:  Anterior;Left;Proximal  Staging: Stage 2 -  Partial thickness loss of dermis presenting as a shallow open injury with a red, pink wound bed without slough.  Wound Description (Comments): Round 1cmx1cm pressure sore, yellow center with pink edges, dry wound bed  Present on Admission: Yes     Pressure Injury 04/05/22 Toe (Comment  which one) Left;Lateral Unstageable - Full thickness tissue loss in which the base of the injury is covered by slough (yellow, tan, gray, green or brown) and/or eschar (tan, brown or black) in the wound bed. 2cmx1cm (Active)  04/05/22 0000  Location: Toe (Comment  which one) (outer aspect of pinky toe)  Location Orientation: Left;Lateral  Staging: Unstageable - Full thickness tissue loss in which the base of the injury is covered by slough (yellow, tan, gray, green or brown) and/or eschar (tan, brown or black) in the wound bed.  Wound Description (Comments): 2cmx1cm oval shaped wound, covered with grey/brown scabbing which has hair/fuzz? imbedded in it, dry with no drainage  Present on Admission: Yes       Nutrition Problem: Severe Malnutrition Etiology: chronic illness Pressure Injury 01/11/22 Sacrum Mid;Medial Stage 1 -  Intact skin with non-blanchable redness of a localized area usually over a bony prominence. (Active)  01/11/22 0226  Location: Sacrum  Location Orientation: Mid;Medial  Staging: Stage 1 -  Intact skin with non-blanchable redness of a localized area usually over a bony prominence.  Wound Description (Comments):   Present on Admission: Yes  Dressing Type Foam - Lift dressing to assess site every shift 04/18/22 2200     Pressure Injury 04/05/22 Hip Anterior;Left;Proximal Stage 2 -  Partial thickness loss of dermis presenting as a shallow open injury with a red, pink wound bed without slough. Round 1cmx1cm pressure sore, yellow center with pink edges, dry wound bed (Active)  04/05/22 0000  Location: Hip  Location Orientation:  Anterior;Left;Proximal  Staging: Stage 2 -  Partial thickness loss of dermis presenting as a shallow open injury with a red, pink wound bed without slough.  Wound Description (Comments): Round 1cmx1cm pressure sore, yellow center with pink edges, dry wound bed  Present on Admission: Yes  Dressing Type Foam - Lift dressing to assess site every shift 04/19/22 0100     Pressure Injury 04/05/22 Toe (Comment  which one) Left;Lateral Unstageable - Full thickness tissue loss in which the base of the injury is covered by slough (yellow, tan, gray, green or brown) and/or eschar (tan, brown or black) in the wound bed. 2cmx1cm (Active)  04/05/22 0000  Location: Toe (Comment  which one)  Location Orientation: Left;Lateral  Staging: Unstageable - Full thickness tissue loss in which the base of the injury is covered by slough (yellow, tan, gray, green or brown) and/or eschar (tan, brown or black) in the wound bed.  Wound Description (Comments): 2cmx1cm oval shaped wound, covered with grey/brown scabbing which has hair/fuzz? imbedded in it, dry with no drainage  Present on Admission: Yes  Dressing Type Foam - Lift dressing to assess site every shift 04/19/22 0100    DVT prophylaxis:heparin injection 5,000 Units Start: 04/05/22 0600 SCDs Start: 04/05/22 0018     Code Status: DNR  Family Communication: None at bedside  Patient status:Inpatient  Patient is from :Home  Anticipated discharge to:SNF  Estimated DC date:Not sure   Consultants: ID  Procedures:None  Antimicrobials:  Anti-infectives (From admission, onward)  Start     Dose/Rate Route Frequency Ordered Stop   04/12/22 1000  amoxicillin-clavulanate (AUGMENTIN) 875-125 MG per tablet 1 tablet        1 tablet Oral Every 12 hours 04/12/22 0847     04/05/22 1700  Ampicillin-Sulbactam (UNASYN) 3 g in sodium chloride 0.9 % 100 mL IVPB  Status:  Discontinued        3 g 200 mL/hr over 30 Minutes Intravenous Every 6 hours 04/05/22 1643  04/12/22 0847   04/05/22 0800  cefTRIAXone (ROCEPHIN) 1 g in sodium chloride 0.9 % 100 mL IVPB  Status:  Discontinued        1 g 200 mL/hr over 30 Minutes Intravenous Every 24 hours 04/05/22 0017 04/05/22 1643   04/04/22 2145  cefTRIAXone (ROCEPHIN) 1 g in sodium chloride 0.9 % 100 mL IVPB        1 g 200 mL/hr over 30 Minutes Intravenous  Once 04/04/22 2135 04/04/22 2240       Subjective: Patient seen and examined at bedside today.  Hemodynamically stable.  Overall comfortable, lying in bed.  Complains of some abdominal discomfort, lack of bowel movement for last few days.  Objective: Vitals:   04/18/22 1358 04/18/22 2025 04/19/22 0500 04/19/22 0513  BP: 123/60 136/67  120/68  Pulse: (!) 108 (!) 108  (!) 109  Resp: 19 18  16   Temp: 98.9 F (37.2 C) 97.8 F (36.6 C)  98.5 F (36.9 C)  TempSrc: Oral Oral  Oral  SpO2: 96% 95%  95%  Weight:   53.9 kg   Height:        Intake/Output Summary (Last 24 hours) at 04/19/2022 0911 Last data filed at 04/19/2022 0500 Gross per 24 hour  Intake 600 ml  Output 500 ml  Net 100 ml   Filed Weights   04/17/22 0500 04/18/22 0500 04/19/22 0500  Weight: 53.6 kg 53.9 kg 53.9 kg    Examination:  General exam: Overall comfortable, not in distress,thin built HEENT: PERRL Respiratory system:  no wheezes or crackles  Cardiovascular system: S1 & S2 heard, RRR.  Gastrointestinal system: Abdomen is distended, soft and nontender. Central nervous system: Alert and oriented Extremities: No edema, no clubbing ,no cyanosis.  Ulcer on the lateral side of the left foot near the small toe Skin: No rashes, no icterus  ,ulcers as above   Data Reviewed: I have personally reviewed following labs and imaging studies  CBC: Recent Labs  Lab 04/19/22 0735  WBC 7.7  HGB 12.4  HCT 40.3  MCV 87.6  PLT 007   Basic Metabolic Panel: Recent Labs  Lab 04/19/22 0735  NA 129*  K 5.1  CL 92*  CO2 29  GLUCOSE 142*  BUN 14  CREATININE 0.59  CALCIUM  8.8*     No results found for this or any previous visit (from the past 240 hour(s)).   Radiology Studies: No results found.  Scheduled Meds:  (feeding supplement) PROSource Plus  30 mL Oral BID BM   amoxicillin-clavulanate  1 tablet Oral Q12H   budesonide (PULMICORT) nebulizer solution  0.25 mg Nebulization BID   heparin  5,000 Units Subcutaneous Q8H   insulin aspart  0-15 Units Subcutaneous TID WC   insulin aspart  0-5 Units Subcutaneous QHS   insulin glargine-yfgn  18 Units Subcutaneous QHS   ipratropium-albuterol  3 mL Nebulization BID   lactulose  20 g Oral Daily   lidocaine  1 patch Transdermal Q24H   polyethylene glycol  17  g Oral BID   senna-docusate  1 tablet Oral BID   tamsulosin  0.4 mg Oral QPC supper   Continuous Infusions:   LOS: 15 days   Shelly Coss, MD [facility] P12/13/2023, 9:11 AM

## 2022-04-20 LAB — BASIC METABOLIC PANEL
Anion gap: 9 (ref 5–15)
BUN: 15 mg/dL (ref 8–23)
CO2: 30 mmol/L (ref 22–32)
Calcium: 9 mg/dL (ref 8.9–10.3)
Chloride: 91 mmol/L — ABNORMAL LOW (ref 98–111)
Creatinine, Ser: 0.65 mg/dL (ref 0.44–1.00)
GFR, Estimated: >60 mL/min (ref >60)
Glucose, Bld: 156 mg/dL — ABNORMAL HIGH (ref 70–99)
Potassium: 4.8 mmol/L (ref 3.5–5.1)
Sodium: 130 mmol/L — ABNORMAL LOW (ref 135–145)

## 2022-04-20 LAB — GLUCOSE, CAPILLARY
Glucose-Capillary: 130 mg/dL — ABNORMAL HIGH (ref 70–99)
Glucose-Capillary: 144 mg/dL — ABNORMAL HIGH (ref 70–99)
Glucose-Capillary: 236 mg/dL — ABNORMAL HIGH (ref 70–99)
Glucose-Capillary: 236 mg/dL — ABNORMAL HIGH (ref 70–99)
Glucose-Capillary: 176 mg/dL — ABNORMAL HIGH (ref 70–99)
Glucose-Capillary: 172 mg/dL — ABNORMAL HIGH (ref 70–99)

## 2022-04-20 MED ORDER — SODIUM CHLORIDE 0.9 % IV SOLN: INTRAVENOUS | Status: DC

## 2022-04-20 NOTE — Progress Notes (Addendum)
PROGRESS NOTE  [name]  DGU:[id number] DOB: 12-16-1935 DOA: 04/04/2022 PCP: Wardell Honour, MD   Brief Narrative: Patient is 86 year old female with history of CVA, BPPV, arthritis, COPD, diabetes type 2, hypertension, glaucoma , HLD, IBS, left foot osteomyelitis, spinal stenosis, CHB s/p PPM, chronic hypoxia on 3L home O2 who initially presented with altered mental status.  On presentation ,she was hypoglycemic and suspected to have UTI.  Started on antibiotics.  Her blood cultures were Enterococcus faecalis, Rothia Mucilaginosa.  ID was consulted.  Repeat blood cultures have been negative.She wants to go home  Due to lack of capacity, psychiatry was also consulted.  Deemed not to have capacity regarding her decision-making ability.  Plan for skilled nursing facility.  Waiting for placement.APS in process to get the guardianship paperwork. TOC following  Assessment & Plan:  Principal Problem:   Bacteremia Active Problems:   Lacks decision making capacity   Chronic osteomyelitis of left foot ([employer])   Uncontrolled type 2 diabetes mellitus with hyperglycemia (HCC)   Chronic respiratory failure with hypoxia (HCC)   Pressure injury of skin   Pacemaker   Protein-calorie malnutrition, severe   T12 vertebral fracture (HCC)   Hypomagnesemia   Constipation   Bacteremia: One of the bottles showed above organisms.  Etiology could be her left foot chronic wound.  ID was following.  TTE negative, no need for TEE.  ID recommended to continue Augmentin for 4 weeks and outpatient follow-up with ID in 04/26/2022 with Dr Drucilla Schmidt  Chronic left foot osteomyelitis: CT of left foot showed soft tissue wound overlying lateral aspect of fifth metatarsophalangeal joint.  Also showed cortical irregularity of the fifth metatarsal head concerning for osteomyelitis.  Patient was  being followed by ID and was on chronic antibiotic suppression.  Continue Augmentin for now.  Follow-up with ID as an outpatient.   Patient continues to decline surgery, has lack of capacity.  Continue Betadine wet-to-dry dressing twice daily.  Sepsis without acute organ dysfunction: Resolved.  Initially presented with leukocytosis, tachycardia.  Acute cystitis without hematuria: Resolved.  Completed course of antibiotic for UTI.  Urine cultures are negative.  Diabetes type 2 with hyperglycemia: A1c of 12.9.  Likely from noncompliance, lack of capacity.  Continue current insulin regimen.  Monitor blood sugars  Constipation: Continue bowel regimen  T2 vertebral fracture: Unable to perform MRI due to presence of pacemaker.  Patient evaluated  by neurosurgery.  Compression fraction felt to be related to osteoporosis, no associated pain.  Recommended brace for comfort with ambulation.  No surgical intervention recommended.  Continue to work with PT/OT  Hyponatremia: Her sodium level is always borderline low. Continue monitoring,stable  Severe protein calorie moderation: BMI of 21  Patient is status post pacemaker  Chronic respiratory failure with hypoxia: On 3 L of oxygen chronically.Currently on room air  Debility/deconditioning/disposition: Patient from home.  PT/OT recommending SNF.  She declines to go to SNF.  Does not have capacity.  DDS involved.TOC following  Pressure Injury 01/11/22 Sacrum Mid;Medial Stage 1 -  Intact skin with non-blanchable redness of a localized area usually over a bony prominence. (Active)  01/11/22 0226  Location: Sacrum  Location Orientation: Mid;Medial  Staging: Stage 1 -  Intact skin with non-blanchable redness of a localized area usually over a bony prominence.  Wound Description (Comments):   Present on Admission: Yes     Pressure Injury 04/05/22 Hip Anterior;Left;Proximal Stage 2 -  Partial thickness loss of dermis presenting as a shallow open injury with  a red, pink wound bed without slough. Round 1cmx1cm pressure sore, yellow center with pink edges, dry wound bed (Active)  04/05/22  0000  Location: Hip  Location Orientation: Anterior;Left;Proximal  Staging: Stage 2 -  Partial thickness loss of dermis presenting as a shallow open injury with a red, pink wound bed without slough.  Wound Description (Comments): Round 1cmx1cm pressure sore, yellow center with pink edges, dry wound bed  Present on Admission: Yes     Pressure Injury 04/05/22 Toe (Comment  which one) Left;Lateral Unstageable - Full thickness tissue loss in which the base of the injury is covered by slough (yellow, tan, gray, green or brown) and/or eschar (tan, brown or black) in the wound bed. 2cmx1cm (Active)  04/05/22 0000  Location: Toe (Comment  which one) (outer aspect of pinky toe)  Location Orientation: Left;Lateral  Staging: Unstageable - Full thickness tissue loss in which the base of the injury is covered by slough (yellow, tan, gray, green or brown) and/or eschar (tan, brown or black) in the wound bed.  Wound Description (Comments): 2cmx1cm oval shaped wound, covered with grey/brown scabbing which has hair/fuzz? imbedded in it, dry with no drainage  Present on Admission: Yes       Nutrition Problem: Severe Malnutrition Etiology: chronic illness Pressure Injury 01/11/22 Sacrum Mid;Medial Stage 1 -  Intact skin with non-blanchable redness of a localized area usually over a bony prominence. (Active)  01/11/22 0226  Location: Sacrum  Location Orientation: Mid;Medial  Staging: Stage 1 -  Intact skin with non-blanchable redness of a localized area usually over a bony prominence.  Wound Description (Comments):   Present on Admission: Yes  Dressing Type Foam - Lift dressing to assess site every shift 04/20/22 0753     Pressure Injury 04/05/22 Hip Anterior;Left;Proximal Stage 2 -  Partial thickness loss of dermis presenting as a shallow open injury with a red, pink wound bed without slough. Round 1cmx1cm pressure sore, yellow center with pink edges, dry wound bed (Active)  04/05/22 0000  Location: Hip   Location Orientation: Anterior;Left;Proximal  Staging: Stage 2 -  Partial thickness loss of dermis presenting as a shallow open injury with a red, pink wound bed without slough.  Wound Description (Comments): Round 1cmx1cm pressure sore, yellow center with pink edges, dry wound bed  Present on Admission: Yes  Dressing Type Foam - Lift dressing to assess site every shift 04/20/22 0753     Pressure Injury 04/05/22 Toe (Comment  which one) Left;Lateral Unstageable - Full thickness tissue loss in which the base of the injury is covered by slough (yellow, tan, gray, green or brown) and/or eschar (tan, brown or black) in the wound bed. 2cmx1cm (Active)  04/05/22 0000  Location: Toe (Comment  which one)  Location Orientation: Left;Lateral  Staging: Unstageable - Full thickness tissue loss in which the base of the injury is covered by slough (yellow, tan, gray, green or brown) and/or eschar (tan, brown or black) in the wound bed.  Wound Description (Comments): 2cmx1cm oval shaped wound, covered with grey/brown scabbing which has hair/fuzz? imbedded in it, dry with no drainage  Present on Admission: Yes  Dressing Type Foam - Lift dressing to assess site every shift 04/20/22 0753    DVT prophylaxis:heparin injection 5,000 Units Start: 04/05/22 0600 SCDs Start: 04/05/22 0018     Code Status: DNR  Family Communication: None at bedside  Patient status:Inpatient  Patient is from :Home  Anticipated discharge to:SNF  Estimated DC date:Not sure   Consultants: ID,  podiatry, neurosurgery  Procedures:None  Antimicrobials:  Anti-infectives (From admission, onward)    Start     Dose/Rate Route Frequency Ordered Stop   04/12/22 1000  amoxicillin-clavulanate (AUGMENTIN) 875-125 MG per tablet 1 tablet        1 tablet Oral Every 12 hours 04/12/22 0847     04/05/22 1700  Ampicillin-Sulbactam (UNASYN) 3 g in sodium chloride 0.9 % 100 mL IVPB  Status:  Discontinued        3 g 200 mL/hr over 30  Minutes Intravenous Every 6 hours 04/05/22 1643 04/12/22 0847   04/05/22 0800  cefTRIAXone (ROCEPHIN) 1 g in sodium chloride 0.9 % 100 mL IVPB  Status:  Discontinued        1 g 200 mL/hr over 30 Minutes Intravenous Every 24 hours 04/05/22 0017 04/05/22 1643   04/04/22 2145  cefTRIAXone (ROCEPHIN) 1 g in sodium chloride 0.9 % 100 mL IVPB        1 g 200 mL/hr over 30 Minutes Intravenous  Once 04/04/22 2135 04/04/22 2240       Subjective: Patient seen and examined at bedside today.  Hemodynamically stable.  Lying in the bed.  Remains comfortable, on room air.No new complains.She says she wants to go home   Objective: Vitals:   04/19/22 0940 04/19/22 1237 04/19/22 2003 04/20/22 0520  BP:  119/64 121/64 112/64  Pulse:  (!) 110 (!) 110 99  Resp:  20 20 (!) 24  Temp:  98.4 F (36.9 C) 98.3 F (36.8 C) 97.9 F (36.6 C)  TempSrc:  Oral Oral Oral  SpO2: 90% 93% 95% 98%  Weight:      Height:        Intake/Output Summary (Last 24 hours) at 04/20/2022 1230 Last data filed at 04/20/2022 0955 Gross per 24 hour  Intake 480 ml  Output --  Net 480 ml   Filed Weights   04/17/22 0500 04/18/22 0500 04/19/22 0500  Weight: 53.6 kg 53.9 kg 53.9 kg    Examination:  General exam: Overall comfortable, not in distress, thin built HEENT: PERRL Respiratory system:  no wheezes or crackles  Cardiovascular system: S1 & S2 heard, RRR.  Gastrointestinal system: Abdomen is nondistended, soft and nontender. Central nervous system: Alert and oriented Extremities: No edema, no clubbing ,no cyanosis.  Ulcer on the lateral side of the left foot near the small toe Skin: No rashes, no ulcers,no icterus     Data Reviewed: I have personally reviewed following labs and imaging studies  CBC: Recent Labs  Lab 04/19/22 0735  WBC 7.7  HGB 12.4  HCT 40.3  MCV 87.6  PLT 324   Basic Metabolic Panel: Recent Labs  Lab 04/19/22 0735 04/20/22 0400  NA 129* 130*  K 5.1 4.8  CL 92* 91*  CO2 29 30   GLUCOSE 142* 156*  BUN 14 15  CREATININE 0.59 0.65  CALCIUM 8.8* 9.0     No results found for this or any previous visit (from the past 240 hour(s)).   Radiology Studies: No results found.  Scheduled Meds:  (feeding supplement) PROSource Plus  30 mL Oral BID BM   amoxicillin-clavulanate  1 tablet Oral Q12H   budesonide (PULMICORT) nebulizer solution  0.25 mg Nebulization BID   heparin  5,000 Units Subcutaneous Q8H   insulin aspart  0-15 Units Subcutaneous TID WC   insulin aspart  0-5 Units Subcutaneous QHS   insulin glargine-yfgn  18 Units Subcutaneous QHS   ipratropium-albuterol  3 mL Nebulization BID  lactulose  20 g Oral Daily   lidocaine  1 patch Transdermal Q24H   polyethylene glycol  17 g Oral BID   senna-docusate  1 tablet Oral BID   tamsulosin  0.4 mg Oral QPC supper   Continuous Infusions:   LOS: 16 days   Shelly Coss, MD [facility] P12/14/2023, 12:30 PM

## 2022-04-21 DIAGNOSIS — R7881 Bacteremia: Secondary | ICD-10-CM | POA: Diagnosis not present

## 2022-04-21 LAB — URINALYSIS, ROUTINE W REFLEX MICROSCOPIC
Bilirubin Urine: NEGATIVE
Glucose, UA: 50 mg/dL — AB
Ketones, ur: NEGATIVE mg/dL
Nitrite: NEGATIVE
Protein, ur: 100 mg/dL — AB
RBC / HPF: >50 RBC/hpf — ABNORMAL HIGH (ref 0–5)
Specific Gravity, Urine: 1.01 (ref 1.005–1.030)
WBC, UA: >50 WBC/hpf — ABNORMAL HIGH (ref 0–5)
pH: 5 (ref 5.0–8.0)

## 2022-04-21 LAB — GLUCOSE, CAPILLARY
Glucose-Capillary: 167 mg/dL — ABNORMAL HIGH (ref 70–99)
Glucose-Capillary: 220 mg/dL — ABNORMAL HIGH (ref 70–99)
Glucose-Capillary: 297 mg/dL — ABNORMAL HIGH (ref 70–99)
Glucose-Capillary: 292 mg/dL — ABNORMAL HIGH (ref 70–99)

## 2022-04-21 MED ORDER — MECLIZINE HCL 25 MG PO TABS: 12.5000 mg | ORAL_TABLET | Freq: Three times a day (TID) | ORAL | Status: DC | PRN

## 2022-04-21 NOTE — Progress Notes (Signed)
Physical Therapy Treatment Patient Details Name: [name] MRN: [id number] DOB: 1936/03/11 Today's Date: 04/21/2022   History of Present Illness The patient is a 86 year old female with history of diabetes mellitus type 2, complete heart block status post pacemaker, chronic hypoxic respiratory failure on home oxygen at 3 L/min, COPD, unspecified CVA, hypertension, hyperlipidemia, thrombocytopenia, chronic wounds including left metatarsal head osteomyelitis. She was found to have a UTI. She was also found to have T12 vertebral fracture.  Surgery felt that she has a T12 osteoporotic compression fracture with associated pain with no retropulsion or canal stenosis.  There is nothing to suggest osteomyelitis or discitis and they are recommending her be treated with bracing for pain control    PT Comments    General Comments: AxO x 2 more interactive esp when you talk to her about her cats.  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  Able to recall recent falls but unable to recall "how I got here".  Following all directions.  Slight humor.  "I just don't understand how I got so weak", poor insight to current medical status.  Declines SNF "cause I had a horrible experience before". Attempted Orthostatic vitals but pt was unable to stand. Supine            BP 115/67(81)  HR 107 EOB               BP 104/66(76)  HR 104   NO dizziness just anxiety EOB x 5 min   BP 127/77(91)  HR 120 Unable to stand due to increased anxiety/fear of falling General bed mobility comments: required increased asisst this session.  Profoundly weak with c/o back pain.  Esp difficult to scoot and get back into bed.  Used bed pad to slide to [facility]. General transfer comment: attempted sit to stand however unable to fully achieve due to increased anxiety/fear of falling and weakness.  "I just can't do it".  "Why am I so weak"  "The Doctors should be able to fix that". Pt lives home alone.  Pt will  need ST Rehab at SNF to address mobility and functional decline prior to safely returning home.   Recommendations for follow up therapy are one component of a multi-disciplinary discharge planning process, led by the attending physician.  Recommendations may be updated based on patient status, additional functional criteria and insurance authorization.  Follow Up Recommendations  Skilled nursing-short term rehab (<3 hours/day) Can patient physically be transported by private vehicle: No   Assistance Recommended at Discharge Frequent or constant Supervision/Assistance  Patient can return home with the following A lot of help with walking and/or transfers;Assistance with cooking/housework;Assist for transportation;Help with stairs or ramp for entrance;A lot of help with bathing/dressing/bathroom   Equipment Recommendations  None recommended by PT    Recommendations for Other Services       Precautions / Restrictions Precautions Precautions: Fall;Back Precaution Comments: No formal back precautions/Kyphotice Restrictions Weight Bearing Restrictions: No     Mobility  Bed Mobility Overal bed mobility: Needs Assistance Bed Mobility: Supine to Sit, Sit to Supine     Supine to sit: Max assist Sit to supine: Max assist, Total assist   General bed mobility comments: required increased asisst this session.  Profoundly weak with c/o back pain.  Esp difficult to scoot and get back into bed.  Used bed pad to slide to [facility].    Transfers  General transfer comment: attempted sit to stand however unable to fully achieve due to increased anxiety/fear of falling and weakness.  "I just can't do it".  "Why am I so weak"  "The Doctors should be able to fix that".    Ambulation/Gait               General Gait Details: NOT able   Stairs             Wheelchair Mobility    Modified Rankin (Stroke Patients Only)       Balance                                             Cognition Arousal/Alertness: Awake/alert Behavior During Therapy: WFL for tasks assessed/performed Overall Cognitive Status: No family/caregiver present to determine baseline cognitive functioning                                 General Comments: AxO x 2 more interactive esp when you talk to her about her cats.  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  Able to recall recent falls but unable to recall "how I got here".  Following all directions.  Slight humor.  "I just don't understand how I got so weak", poor insight to current medical status.  Declines SNF "cause I had a horrible experience before".        Exercises      General Comments General comments (skin integrity, edema, etc.): BP 82/58 (MAP 67) HR 129, spo2 >90% on 1 L via [place] seated on edge of pt, pt endorses feeling dizzy; team notified via secure chat, RN/NT in room at end of session      Pertinent Vitals/Pain Pain Assessment Pain Assessment: Faces Faces Pain Scale: Hurts even more Pain Location: back Pain Descriptors / Indicators: Aching, Discomfort, Grimacing, Nagging Pain Intervention(s): Monitored during session, Repositioned    Home Living                          Prior Function            PT Goals (current goals can now be found in the care plan section) Progress towards PT goals: Progressing toward goals    Frequency    Min 2X/week      PT Plan Current plan remains appropriate    Co-evaluation              AM-PAC PT "6 Clicks" Mobility   Outcome Measure  Help needed turning from your back to your side while in a flat bed without using bedrails?: A Lot Help needed moving from lying on your back to sitting on the side of a flat bed without using bedrails?: A Lot Help needed moving to and from a bed to a chair (including a wheelchair)?: A Lot Help needed standing up from a chair using your arms  (e.g., wheelchair or bedside chair)?: Total Help needed to walk in hospital room?: Total Help needed climbing 3-5 steps with a railing? : Total 6 Click Score: 9    End of Session Equipment Utilized During Treatment: Gait belt Activity Tolerance: Patient limited by fatigue Patient left: in bed;with call bell/phone within reach Nurse Communication: Mobility status PT Visit  Diagnosis: Difficulty in walking, not elsewhere classified (R26.2);Muscle weakness (generalized) (M62.81)     Time: 1335-1400 PT Time Calculation (min) (ACUTE ONLY): 25 min  Charges:  $Therapeutic Activity: 23-37 mins                     Rica Koyanagi  PTA Acute  Rehabilitation Services Office M-F          [phone] Weekend pager [phone]

## 2022-04-21 NOTE — Progress Notes (Signed)
PROGRESS NOTE  [name]  QQP:[id number] DOB: [birth year] DOA: 04/04/2022 PCP: Wardell Honour, MD   Brief Narrative: Patient is 86 year old female with history of CVA, BPPV, arthritis, COPD, diabetes type 2, hypertension, glaucoma , HLD, IBS, left foot osteomyelitis, spinal stenosis, CHB s/p PPM, chronic hypoxia on 3L home O2 who initially presented with altered mental status.  On presentation ,she was hypoglycemic and suspected to have UTI.  Started on antibiotics.  Her blood cultures were Enterococcus faecalis, Rothia Mucilaginosa.  ID was consulted.  Repeat blood cultures have been negative.She wants to go home  Due to lack of capacity, psychiatry was also consulted.  Deemed not to have capacity regarding her decision-making ability.  Plan for skilled nursing facility.  Waiting for placement.APS in process to get the guardianship paperwork. TOC following  Assessment & Plan:  Principal Problem:   Bacteremia Active Problems:   Lacks decision making capacity   Chronic osteomyelitis of left foot ([place])   Uncontrolled type 2 diabetes mellitus with hyperglycemia (HCC)   Chronic respiratory failure with hypoxia (HCC)   Pressure injury of skin   Pacemaker   Protein-calorie malnutrition, severe   T12 vertebral fracture (HCC)   Hypomagnesemia   Constipation   Bacteremia: One of the bottles showed above organisms.  Etiology could be her left foot chronic wound.  ID was following.  TTE negative, no need for TEE.  ID recommended to continue Augmentin for 4 weeks and outpatient follow-up with ID in 04/26/2022 with Dr Drucilla Schmidt  Chronic left foot osteomyelitis: CT of left foot showed soft tissue wound overlying lateral aspect of fifth metatarsophalangeal joint.  Also showed cortical irregularity of the fifth metatarsal head concerning for osteomyelitis.  Patient was  being followed by ID and was on chronic antibiotic suppression.  Continue Augmentin for now.  Follow-up with ID as an outpatient.   Patient continues to decline surgery, has lack of capacity.  Continue Betadine wet-to-dry dressing twice daily.  Sepsis without acute organ dysfunction: Resolved.  Initially presented with leukocytosis, tachycardia.  Acute cystitis without hematuria: Resolved.  Completed course of antibiotic for UTI.  Urine cultures are negative.  Diabetes type 2 with hyperglycemia: A1c of 12.9.  Likely from noncompliance, lack of capacity.  Continue current insulin regimen.  Monitor blood sugars  Constipation: Continue bowel regimen  T2 vertebral fracture: Unable to perform MRI due to presence of pacemaker.  Patient evaluated  by neurosurgery.  Compression fraction felt to be related to osteoporosis, no associated pain.  Recommended brace for comfort with ambulation.  No surgical intervention recommended.  Continue to work with PT/OT  Hyponatremia: Her sodium level is always borderline low. Continue monitoring,stable  Severe protein calorie moderation: BMI of 21  Patient is status post pacemaker  Chronic respiratory failure with hypoxia: On 3 L of oxygen chronically.  Debility/deconditioning/disposition: Patient from home.  PT/OT recommending SNF.  She declines to go to SNF.  Does not have capacity.  DDS involved.TOC following  Lightheadedness/dizziness: EKG without any concerns.  Blood pressure soft but stable.  Continue IV fluids for today.  Continue meclizine for dizziness.  Will check orthostatic vitals.  Pressure Injury 01/11/22 Sacrum Mid;Medial Stage 1 -  Intact skin with non-blanchable redness of a localized area usually over a bony prominence. (Active)  01/11/22 0226  Location: Sacrum  Location Orientation: Mid;Medial  Staging: Stage 1 -  Intact skin with non-blanchable redness of a localized area usually over a bony prominence.  Wound Description (Comments):   Present on Admission: Yes  Pressure Injury 04/05/22 Hip Anterior;Left;Proximal Stage 2 -  Partial thickness loss of dermis  presenting as a shallow open injury with a red, pink wound bed without slough. Round 1cmx1cm pressure sore, yellow center with pink edges, dry wound bed (Active)  04/05/22 0000  Location: Hip  Location Orientation: Anterior;Left;Proximal  Staging: Stage 2 -  Partial thickness loss of dermis presenting as a shallow open injury with a red, pink wound bed without slough.  Wound Description (Comments): Round 1cmx1cm pressure sore, yellow center with pink edges, dry wound bed  Present on Admission: Yes     Pressure Injury 04/05/22 Toe (Comment  which one) Left;Lateral Unstageable - Full thickness tissue loss in which the base of the injury is covered by slough (yellow, tan, gray, green or brown) and/or eschar (tan, brown or black) in the wound bed. 2cmx1cm (Active)  04/05/22 0000  Location: Toe (Comment  which one) (outer aspect of pinky toe)  Location Orientation: Left;Lateral  Staging: Unstageable - Full thickness tissue loss in which the base of the injury is covered by slough (yellow, tan, gray, green or brown) and/or eschar (tan, brown or black) in the wound bed.  Wound Description (Comments): 2cmx1cm oval shaped wound, covered with grey/brown scabbing which has hair/fuzz? imbedded in it, dry with no drainage  Present on Admission: Yes       Nutrition Problem: Severe Malnutrition Etiology: chronic illness Pressure Injury 01/11/22 Sacrum Mid;Medial Stage 1 -  Intact skin with non-blanchable redness of a localized area usually over a bony prominence. (Active)  01/11/22 0226  Location: Sacrum  Location Orientation: Mid;Medial  Staging: Stage 1 -  Intact skin with non-blanchable redness of a localized area usually over a bony prominence.  Wound Description (Comments):   Present on Admission: Yes  Dressing Type Foam - Lift dressing to assess site every shift 04/20/22 0753     Pressure Injury 04/05/22 Hip Anterior;Left;Proximal Stage 2 -  Partial thickness loss of dermis presenting as a  shallow open injury with a red, pink wound bed without slough. Round 1cmx1cm pressure sore, yellow center with pink edges, dry wound bed (Active)  04/05/22 0000  Location: Hip  Location Orientation: Anterior;Left;Proximal  Staging: Stage 2 -  Partial thickness loss of dermis presenting as a shallow open injury with a red, pink wound bed without slough.  Wound Description (Comments): Round 1cmx1cm pressure sore, yellow center with pink edges, dry wound bed  Present on Admission: Yes  Dressing Type Foam - Lift dressing to assess site every shift 04/20/22 2045     Pressure Injury 04/05/22 Toe (Comment  which one) Left;Lateral Unstageable - Full thickness tissue loss in which the base of the injury is covered by slough (yellow, tan, gray, green or brown) and/or eschar (tan, brown or black) in the wound bed. 2cmx1cm (Active)  04/05/22 0000  Location: Toe (Comment  which one)  Location Orientation: Left;Lateral  Staging: Unstageable - Full thickness tissue loss in which the base of the injury is covered by slough (yellow, tan, gray, green or brown) and/or eschar (tan, brown or black) in the wound bed.  Wound Description (Comments): 2cmx1cm oval shaped wound, covered with grey/brown scabbing which has hair/fuzz? imbedded in it, dry with no drainage  Present on Admission: Yes  Dressing Type Foam - Lift dressing to assess site every shift 04/20/22 2045    DVT prophylaxis:heparin injection 5,000 Units Start: 04/05/22 0600 SCDs Start: 04/05/22 0018     Code Status: DNR  Family Communication: None at bedside  Patient status:Inpatient  Patient is from :Home  Anticipated discharge to:SNF  Estimated DC date:Not sure   Consultants: ID, podiatry, neurosurgery  Procedures:None  Antimicrobials:  Anti-infectives (From admission, onward)    Start     Dose/Rate Route Frequency Ordered Stop   04/12/22 1000  amoxicillin-clavulanate (AUGMENTIN) 875-125 MG per tablet 1 tablet        1 tablet Oral  Every 12 hours 04/12/22 0847     04/05/22 1700  Ampicillin-Sulbactam (UNASYN) 3 g in sodium chloride 0.9 % 100 mL IVPB  Status:  Discontinued        3 g 200 mL/hr over 30 Minutes Intravenous Every 6 hours 04/05/22 1643 04/12/22 0847   04/05/22 0800  cefTRIAXone (ROCEPHIN) 1 g in sodium chloride 0.9 % 100 mL IVPB  Status:  Discontinued        1 g 200 mL/hr over 30 Minutes Intravenous Every 24 hours 04/05/22 0017 04/05/22 1643   04/04/22 2145  cefTRIAXone (ROCEPHIN) 1 g in sodium chloride 0.9 % 100 mL IVPB        1 g 200 mL/hr over 30 Minutes Intravenous  Once 04/04/22 2135 04/04/22 2240       Subjective: Patient seen and examined at bedside today.  She looks comfortable, getting nebulizer treatment.  She did not look in any current distress.  IV fluids were restarted yesterday because she was complaining of lightheadedness and her blood pressure was soft.  She continues to state that she wants to go home and does not want to go to SNF.   Objective: Vitals:   04/21/22 0500 04/21/22 0646 04/21/22 0932 04/21/22 1141  BP:  102/63  108/64  Pulse:  96    Resp:  20    Temp:  (!) 97.4 F (36.3 C)    TempSrc:  Oral    SpO2:  99% 98%   Weight: 54 kg     Height:        Intake/Output Summary (Last 24 hours) at 04/21/2022 1223 Last data filed at 04/21/2022 0900 Gross per 24 hour  Intake 1550.47 ml  Output 300 ml  Net 1250.47 ml   Filed Weights   04/18/22 0500 04/19/22 0500 04/21/22 0500  Weight: 53.9 kg 53.9 kg 54 kg    Examination:   General exam: Overall comfortable, not in distress,thin built HEENT: PERRL Respiratory system:  no wheezes or crackles  Cardiovascular system: S1 & S2 heard, RRR.  Gastrointestinal system: Abdomen is nondistended, soft and nontender. Central nervous system: Alert and oriented Extremities: No edema, no clubbing ,no cyanosis, ulcer on the lateral side of the left foot near the small toe Skin: No rashes, no ulcers,no icterus     Data Reviewed: I  have personally reviewed following labs and imaging studies  CBC: Recent Labs  Lab 04/19/22 0735  WBC 7.7  HGB 12.4  HCT 40.3  MCV 87.6  PLT 350   Basic Metabolic Panel: Recent Labs  Lab 04/19/22 0735 04/20/22 0400  NA 129* 130*  K 5.1 4.8  CL 92* 91*  CO2 29 30  GLUCOSE 142* 156*  BUN 14 15  CREATININE 0.59 0.65  CALCIUM 8.8* 9.0     No results found for this or any previous visit (from the past 240 hour(s)).   Radiology Studies: No results found.  Scheduled Meds:  (feeding supplement) PROSource Plus  30 mL Oral BID BM   amoxicillin-clavulanate  1 tablet Oral Q12H   budesonide (PULMICORT) nebulizer solution  0.25 mg Nebulization BID  heparin  5,000 Units Subcutaneous Q8H   insulin aspart  0-15 Units Subcutaneous TID WC   insulin aspart  0-5 Units Subcutaneous QHS   insulin glargine-yfgn  18 Units Subcutaneous QHS   ipratropium-albuterol  3 mL Nebulization BID   lactulose  20 g Oral Daily   lidocaine  1 patch Transdermal Q24H   polyethylene glycol  17 g Oral BID   senna-docusate  1 tablet Oral BID   tamsulosin  0.4 mg Oral QPC supper   Continuous Infusions:  sodium chloride 100 mL/hr at 04/21/22 0641     LOS: 40 days   Shelly Coss, MD [facility] P12/15/2023, 12:23 PM

## 2022-04-21 NOTE — Plan of Care (Signed)
  Problem: Fluid Volume: Goal: Ability to maintain a balanced intake and output will improve Outcome: Progressing   Problem: Skin Integrity: Goal: Risk for impaired skin integrity will decrease Outcome: Progressing   Problem: Clinical Measurements: Goal: Will remain free from infection Outcome: Progressing   Problem: Clinical Measurements: Goal: Diagnostic test results will improve Outcome: Progressing

## 2022-04-21 NOTE — Progress Notes (Signed)
Occupational Therapy Treatment Patient Details Name: [name] MRN: [id number] DOB: [birth year] Today's Date: 04/21/2022   History of present illness The patient is a 86 year old female with history of diabetes mellitus type 2, complete heart block status post pacemaker, chronic hypoxic respiratory failure on home oxygen at 3 L/min, COPD, unspecified CVA, hypertension, hyperlipidemia, thrombocytopenia, chronic wounds including left metatarsal head osteomyelitis. She was found to have a UTI. She was also found to have T12 vertebral fracture.  Surgery felt that she has a T12 osteoporotic compression fracture with associated pain with no retropulsion or canal stenosis.  There is nothing to suggest osteomyelitis or discitis and they are recommending her be treated with bracing for pain control   OT comments  Chart reviewed, pt greeted in bed agreeable to OT intervention. Pt is oriented to self, place; not oriented to date/situation. Tx session targeted improving functional activity tolerance in order to improve ADL participation. Limited mobility on this date due to low BP, pt reporting dizziness seated on edge of bed. Please see details below, team notified. Pt continues to present with functional deficits affecting safe and optimal ADL completion.  Discharge recommendation remains appropriate. OT will continue to follow acutely.    Recommendations for follow up therapy are one component of a multi-disciplinary discharge planning process, led by the attending physician.  Recommendations may be updated based on patient status, additional functional criteria and insurance authorization.    Follow Up Recommendations  Skilled nursing-short term rehab (<3 hours/day)     Assistance Recommended at Discharge Frequent or constant Supervision/Assistance  Patient can return home with the following  A lot of help with bathing/dressing/bathroom;Assistance with cooking/housework;Direct supervision/assist for  financial management;Assist for transportation;Help with stairs or ramp for entrance;Direct supervision/assist for medications management;A lot of help with walking and/or transfers   Equipment Recommendations  Other (comment) (per next venue of care)    Recommendations for Other Services      Precautions / Restrictions Precautions Precautions: Fall;Back Precaution Comments: No formal back precautions Restrictions Weight Bearing Restrictions: No       Mobility Bed Mobility Overal bed mobility: Needs Assistance Bed Mobility: Rolling, Sidelying to Sit, Sit to Sidelying Rolling: Min assist Sidelying to sit: Min assist   Sit to supine: Mod assist   General bed mobility comments: for managment of BLE    Transfers                   General transfer comment: attempted on this date- pt endorses feeling dizzy, BP 82/58, deferred at this time     Balance Overall balance assessment: Needs assistance, History of Falls Sitting-balance support: Feet supported, No upper extremity supported Sitting balance-Leahy Scale: Fair Sitting balance - Comments: seated edge of bed for approx 10 minutes                                   ADL either performed or assessed with clinical judgement   ADL Overall ADL's : Needs assistance/impaired     Grooming: Sitting;Set up       Lower Body Bathing: Maximal assistance   Upper Body Dressing : Minimal assistance;Sitting   Lower Body Dressing: Maximal assistance       Toileting- Clothing Manipulation and Hygiene: Total assistance              Extremity/Trunk Assessment              Vision  Perception     Praxis      Cognition Arousal/Alertness: Awake/alert Behavior During Therapy: Flat affect Overall Cognitive Status: No family/caregiver present to determine baseline cognitive functioning Area of Impairment: Orientation, Attention, Memory, Following commands, Safety/judgement, Awareness                  Orientation Level: Disoriented to, Time, Situation Current Attention Level: Sustained Memory: Decreased short-term memory Following Commands: Follows one step commands with increased time Safety/Judgement: Decreased awareness of safety, Decreased awareness of deficits Awareness: Intellectual            Exercises Other Exercises Other Exercises: edu re: importance of continued participation in mobility efforts/ therapeutic activity    Shoulder Instructions       General Comments BP 82/58 (MAP 67) HR 129, spo2 >90% on 1 L via [place] seated on edge of pt, pt endorses feeling dizzy; team notified via secure chat, RN/NT in room at end of session    Pertinent Vitals/ Pain       Pain Assessment Pain Assessment: Faces Faces Pain Scale: Hurts a little bit Pain Location: stomach Pain Descriptors / Indicators: Discomfort Pain Intervention(s): Monitored during session  Home Living                                          Prior Functioning/Environment              Frequency  Min 2X/week        Progress Toward Goals  OT Goals(current goals can now be found in the care plan section)  Progress towards OT goals: Progressing toward goals     Plan Discharge plan remains appropriate    Co-evaluation                 AM-PAC OT "6 Clicks" Daily Activity     Outcome Measure   Help from another person eating meals?: A Little Help from another person taking care of personal grooming?: A Little Help from another person toileting, which includes using toliet, bedpan, or urinal?: Total Help from another person bathing (including washing, rinsing, drying)?: A Lot Help from another person to put on and taking off regular upper body clothing?: A Little Help from another person to put on and taking off regular lower body clothing?: A Lot 6 Click Score: 14    End of Session Equipment Utilized During Treatment: Rolling walker (2 wheels)  OT  Visit Diagnosis: Muscle weakness (generalized) (M62.81);Other symptoms and signs involving cognitive function   Activity Tolerance Treatment limited secondary to medical complications (Comment) (low BP)   Patient Left in bed;with bed alarm set;with call bell/phone within reach;with nursing/sitter in room   Nurse Communication Mobility status        Time: 1100-1119 OT Time Calculation (min): 19 min  Charges: OT General Charges $OT Visit: 1 Visit OT Treatments $Therapeutic Activity: 8-22 mins  Shanon Payor, OTD OTR/L  04/21/22, 11:28 AM

## 2022-04-22 DIAGNOSIS — R7881 Bacteremia: Secondary | ICD-10-CM | POA: Diagnosis not present

## 2022-04-22 LAB — CBC
HCT: 39.5 % (ref 36.0–46.0)
Hemoglobin: 12.2 g/dL (ref 12.0–15.0)
MCH: 26.8 pg (ref 26.0–34.0)
MCHC: 30.9 g/dL (ref 30.0–36.0)
MCV: 86.6 fL (ref 80.0–100.0)
Platelets: 203 10*3/uL (ref 150–400)
RBC: 4.56 MIL/uL (ref 3.87–5.11)
RDW: 14.6 % (ref 11.5–15.5)
WBC: 7.4 10*3/uL (ref 4.0–10.5)
nRBC: 0 % (ref 0.0–0.2)

## 2022-04-22 LAB — BASIC METABOLIC PANEL
Anion gap: 9 (ref 5–15)
BUN: 13 mg/dL (ref 8–23)
CO2: 29 mmol/L (ref 22–32)
Calcium: 8.7 mg/dL — ABNORMAL LOW (ref 8.9–10.3)
Chloride: 96 mmol/L — ABNORMAL LOW (ref 98–111)
Creatinine, Ser: 0.37 mg/dL — ABNORMAL LOW (ref 0.44–1.00)
GFR, Estimated: >60 mL/min (ref >60)
Glucose, Bld: 166 mg/dL — ABNORMAL HIGH (ref 70–99)
Potassium: 4 mmol/L (ref 3.5–5.1)
Sodium: 134 mmol/L — ABNORMAL LOW (ref 135–145)

## 2022-04-22 LAB — GLUCOSE, CAPILLARY
Glucose-Capillary: 151 mg/dL — ABNORMAL HIGH (ref 70–99)
Glucose-Capillary: 168 mg/dL — ABNORMAL HIGH (ref 70–99)
Glucose-Capillary: 206 mg/dL — ABNORMAL HIGH (ref 70–99)
Glucose-Capillary: 181 mg/dL — ABNORMAL HIGH (ref 70–99)

## 2022-04-22 NOTE — Plan of Care (Signed)
  Problem: Education: Goal: Knowledge of General Education information will improve Description Including pain rating scale, medication(s)/side effects and non-pharmacologic comfort measures Outcome: Progressing   Problem: Health Behavior/Discharge Planning: Goal: Ability to manage health-related needs will improve Outcome: Progressing   

## 2022-04-22 NOTE — Progress Notes (Signed)
PROGRESS NOTE  [name]  GYI:[id number] DOB: 1936-03-08 DOA: 04/04/2022 PCP: Wardell Honour, MD   Brief Narrative: Patient is 86 year old female with history of CVA, BPPV, arthritis, COPD, diabetes type 2, hypertension, glaucoma , HLD, IBS, left foot osteomyelitis, spinal stenosis, CHB s/p PPM, chronic hypoxia on 3L home O2 who initially presented with altered mental status.  On presentation ,she was hypoglycemic and suspected to have UTI.  Started on antibiotics.  Her blood cultures were Enterococcus faecalis, Rothia Mucilaginosa.  ID was consulted.  Repeat blood cultures have been negative.She wants to go home  Due to lack of capacity, psychiatry was also consulted.  Deemed not to have capacity regarding her decision-making ability.  Plan for skilled nursing facility.  Waiting for placement.APS in process to get the guardianship paperwork. TOC following  Assessment & Plan:  Principal Problem:   Bacteremia Active Problems:   Lacks decision making capacity   Chronic osteomyelitis of left foot ([place])   Uncontrolled type 2 diabetes mellitus with hyperglycemia (HCC)   Chronic respiratory failure with hypoxia (HCC)   Pressure injury of skin   Pacemaker   Protein-calorie malnutrition, severe   T12 vertebral fracture (HCC)   Hypomagnesemia   Constipation   Bacteremia: One of the bottles showed above organisms.  Etiology could be her left foot chronic wound.  ID was following.  TTE negative, no need for TEE.  ID recommended to continue Augmentin for 4 weeks and outpatient follow-up with ID in 04/26/2022 with Dr Drucilla Schmidt  Chronic left foot osteomyelitis: CT of left foot showed soft tissue wound overlying lateral aspect of fifth metatarsophalangeal joint.  Also showed cortical irregularity of the fifth metatarsal head concerning for osteomyelitis.  Patient was  being followed by ID and was on chronic antibiotic suppression.  Continue Augmentin for now.  Follow-up with ID as an outpatient.   Patient continues to decline surgery, has lack of capacity.  Continue Betadine wet-to-dry dressing twice daily.  Sepsis without acute organ dysfunction: Resolved.  Initially presented with leukocytosis, tachycardia.  Acute cystitis without hematuria: Resolved.  Completed course of antibiotic for UTI.  Urine cultures are negative.  Diabetes type 2 with hyperglycemia: A1c of 12.9.  Likely from noncompliance, lack of capacity.  Continue current insulin regimen.  Monitor blood sugars  Constipation: Continue bowel regimen  T2 vertebral fracture: Unable to perform MRI due to presence of pacemaker.  Patient evaluated  by neurosurgery.  Compression fraction felt to be related to osteoporosis, no associated pain.  Recommended brace for comfort with ambulation.  No surgical intervention recommended.  Continue to work with PT/OT  Hyponatremia: Her sodium level is always borderline low. Continue monitoring,stable  Severe protein calorie moderation: BMI of 21  Patient is status post pacemaker  Chronic respiratory failure with hypoxia: On 3 L of oxygen chronically.  Debility/deconditioning/disposition: Patient from home.  PT/OT recommending SNF.  She declines to go to SNF.  Does not have capacity.  DDS involved.TOC following  Lightheadedness/dizziness: EKG without any concerns.  Blood pressure now stable.  Denies lightheadedness or dizziness today.  Pressure Injury 01/11/22 Sacrum Mid;Medial Stage 1 -  Intact skin with non-blanchable redness of a localized area usually over a bony prominence. (Active)  01/11/22 0226  Location: Sacrum  Location Orientation: Mid;Medial  Staging: Stage 1 -  Intact skin with non-blanchable redness of a localized area usually over a bony prominence.  Wound Description (Comments):   Present on Admission: Yes     Pressure Injury 04/05/22 Hip Anterior;Left;Proximal Stage 2 -  Partial thickness loss of dermis presenting as a shallow open injury with a red, pink wound bed  without slough. Round 1cmx1cm pressure sore, yellow center with pink edges, dry wound bed (Active)  04/05/22 0000  Location: Hip  Location Orientation: Anterior;Left;Proximal  Staging: Stage 2 -  Partial thickness loss of dermis presenting as a shallow open injury with a red, pink wound bed without slough.  Wound Description (Comments): Round 1cmx1cm pressure sore, yellow center with pink edges, dry wound bed  Present on Admission: Yes     Pressure Injury 04/05/22 Toe (Comment  which one) Left;Lateral Unstageable - Full thickness tissue loss in which the base of the injury is covered by slough (yellow, tan, gray, green or brown) and/or eschar (tan, brown or black) in the wound bed. 2cmx1cm (Active)  04/05/22 0000  Location: Toe (Comment  which one) (outer aspect of pinky toe)  Location Orientation: Left;Lateral  Staging: Unstageable - Full thickness tissue loss in which the base of the injury is covered by slough (yellow, tan, gray, green or brown) and/or eschar (tan, brown or black) in the wound bed.  Wound Description (Comments): 2cmx1cm oval shaped wound, covered with grey/brown scabbing which has hair/fuzz? imbedded in it, dry with no drainage  Present on Admission: Yes       Nutrition Problem: Severe Malnutrition Etiology: chronic illness Pressure Injury 01/11/22 Sacrum Mid;Medial Stage 1 -  Intact skin with non-blanchable redness of a localized area usually over a bony prominence. (Active)  01/11/22 0226  Location: Sacrum  Location Orientation: Mid;Medial  Staging: Stage 1 -  Intact skin with non-blanchable redness of a localized area usually over a bony prominence.  Wound Description (Comments):   Present on Admission: Yes  Dressing Type Foam - Lift dressing to assess site every shift 04/21/22 2342     Pressure Injury 04/05/22 Hip Anterior;Left;Proximal Stage 2 -  Partial thickness loss of dermis presenting as a shallow open injury with a red, pink wound bed without slough.  Round 1cmx1cm pressure sore, yellow center with pink edges, dry wound bed (Active)  04/05/22 0000  Location: Hip  Location Orientation: Anterior;Left;Proximal  Staging: Stage 2 -  Partial thickness loss of dermis presenting as a shallow open injury with a red, pink wound bed without slough.  Wound Description (Comments): Round 1cmx1cm pressure sore, yellow center with pink edges, dry wound bed  Present on Admission: Yes  Dressing Type Foam - Lift dressing to assess site every shift 04/21/22 2342     Pressure Injury 04/05/22 Toe (Comment  which one) Left;Lateral Unstageable - Full thickness tissue loss in which the base of the injury is covered by slough (yellow, tan, gray, green or brown) and/or eschar (tan, brown or black) in the wound bed. 2cmx1cm (Active)  04/05/22 0000  Location: Toe (Comment  which one)  Location Orientation: Left;Lateral  Staging: Unstageable - Full thickness tissue loss in which the base of the injury is covered by slough (yellow, tan, gray, green or brown) and/or eschar (tan, brown or black) in the wound bed.  Wound Description (Comments): 2cmx1cm oval shaped wound, covered with grey/brown scabbing which has hair/fuzz? imbedded in it, dry with no drainage  Present on Admission: Yes  Dressing Type Foam - Lift dressing to assess site every shift 04/21/22 2342    DVT prophylaxis:heparin injection 5,000 Units Start: 04/05/22 0600 SCDs Start: 04/05/22 0018     Code Status: DNR  Family Communication: None at bedside  Patient status:Inpatient  Patient is from :Home  Anticipated discharge to:SNF  Estimated DC date:Not sure   Consultants: ID, podiatry, neurosurgery  Procedures:None  Antimicrobials:  Anti-infectives (From admission, onward)    Start     Dose/Rate Route Frequency Ordered Stop   04/12/22 1000  amoxicillin-clavulanate (AUGMENTIN) 875-125 MG per tablet 1 tablet        1 tablet Oral Every 12 hours 04/12/22 0847     04/05/22 1700   Ampicillin-Sulbactam (UNASYN) 3 g in sodium chloride 0.9 % 100 mL IVPB  Status:  Discontinued        3 g 200 mL/hr over 30 Minutes Intravenous Every 6 hours 04/05/22 1643 04/12/22 0847   04/05/22 0800  cefTRIAXone (ROCEPHIN) 1 g in sodium chloride 0.9 % 100 mL IVPB  Status:  Discontinued        1 g 200 mL/hr over 30 Minutes Intravenous Every 24 hours 04/05/22 0017 04/05/22 1643   04/04/22 2145  cefTRIAXone (ROCEPHIN) 1 g in sodium chloride 0.9 % 100 mL IVPB        1 g 200 mL/hr over 30 Minutes Intravenous  Once 04/04/22 2135 04/04/22 2240       Subjective: Patient seen and examined at bedside today.  Hemodynamically stable.  Comfortable, on room air.  Complains of some cough.  Not dyspneic.  Constantly says she wants to go home and does not want to go to rehab  Objective: Vitals:   04/21/22 1632 04/21/22 2200 04/22/22 0500 04/22/22 0516  BP: 113/65 114/63  134/63  Pulse: (!) 104 99  87  Resp: 20 18  18   Temp: 98.3 F (36.8 C) 98.3 F (36.8 C)  97.9 F (36.6 C)  TempSrc: Oral Oral  Oral  SpO2: 96% 98%  98%  Weight:   53 kg   Height:        Intake/Output Summary (Last 24 hours) at 04/22/2022 1125 Last data filed at 04/22/2022 0900 Gross per 24 hour  Intake 750 ml  Output 550 ml  Net 200 ml   Filed Weights   04/19/22 0500 04/21/22 0500 04/22/22 0500  Weight: 53.9 kg 54 kg 53 kg    Examination:   General exam: Overall comfortable, not in distress,thin built HEENT: PERRL Respiratory system: Few bibasilar crackles Cardiovascular system: S1 & S2 heard, RRR.  Gastrointestinal system: Abdomen is nondistended, soft and nontender. Central nervous system: Alert and oriented Extremities: No edema, no clubbing ,no cyanosis,ulcer  on the lateral aspect of the left foot Skin: No rashes,no icterus      Data Reviewed: I have personally reviewed following labs and imaging studies  CBC: Recent Labs  Lab 04/19/22 0735 04/22/22 0444  WBC 7.7 7.4  HGB 12.4 12.2  HCT 40.3  39.5  MCV 87.6 86.6  PLT 253 161   Basic Metabolic Panel: Recent Labs  Lab 04/19/22 0735 04/20/22 0400 04/22/22 0444  NA 129* 130* 134*  K 5.1 4.8 4.0  CL 92* 91* 96*  CO2 29 30 29   GLUCOSE 142* 156* 166*  BUN 14 15 13   CREATININE 0.59 0.65 0.37*  CALCIUM 8.8* 9.0 8.7*     No results found for this or any previous visit (from the past 240 hour(s)).   Radiology Studies: No results found.  Scheduled Meds:  (feeding supplement) PROSource Plus  30 mL Oral BID BM   amoxicillin-clavulanate  1 tablet Oral Q12H   budesonide (PULMICORT) nebulizer solution  0.25 mg Nebulization BID   heparin  5,000 Units Subcutaneous Q8H   insulin aspart  0-15 Units  Subcutaneous TID WC   insulin aspart  0-5 Units Subcutaneous QHS   insulin glargine-yfgn  18 Units Subcutaneous QHS   ipratropium-albuterol  3 mL Nebulization BID   lactulose  20 g Oral Daily   lidocaine  1 patch Transdermal Q24H   polyethylene glycol  17 g Oral BID   senna-docusate  1 tablet Oral BID   Continuous Infusions:     LOS: 18 days   Shelly Coss, MD [facility] P12/16/2023, 11:25 AM

## 2022-04-23 DIAGNOSIS — R7881 Bacteremia: Secondary | ICD-10-CM | POA: Diagnosis not present

## 2022-04-23 LAB — GLUCOSE, CAPILLARY
Glucose-Capillary: 125 mg/dL — ABNORMAL HIGH (ref 70–99)
Glucose-Capillary: 246 mg/dL — ABNORMAL HIGH (ref 70–99)
Glucose-Capillary: 188 mg/dL — ABNORMAL HIGH (ref 70–99)
Glucose-Capillary: 329 mg/dL — ABNORMAL HIGH (ref 70–99)

## 2022-04-23 NOTE — Plan of Care (Signed)
  Problem: Education: Goal: Knowledge of General Education information will improve Description Including pain rating scale, medication(s)/side effects and non-pharmacologic comfort measures Outcome: Progressing   Problem: Health Behavior/Discharge Planning: Goal: Ability to manage health-related needs will improve Outcome: Progressing   

## 2022-04-23 NOTE — Progress Notes (Signed)
PROGRESS NOTE  [name]  JAS:[id number] DOB: 06-29-1935 DOA: 04/04/2022 PCP: Wardell Honour, MD   Brief Narrative: Patient is 86 year old female with history of CVA, BPPV, arthritis, COPD, diabetes type 2, hypertension, glaucoma , HLD, IBS, left foot osteomyelitis, spinal stenosis, CHB s/p PPM, chronic hypoxia on 3L home O2 who initially presented with altered mental status.  On presentation ,she was hypoglycemic and suspected to have UTI.  Started on antibiotics.  Her blood cultures were Enterococcus faecalis, Rothia Mucilaginosa.  ID was consulted.  Repeat blood cultures have been negative.She wants to go home  Due to lack of capacity, psychiatry was also consulted.  Deemed not to have capacity regarding her decision-making ability.  Plan for skilled nursing facility.  Waiting for placement.APS in process to get the guardianship paperwork. TOC following  Assessment & Plan:  Principal Problem:   Bacteremia Active Problems:   Lacks decision making capacity   Chronic osteomyelitis of left foot ([place])   Uncontrolled type 2 diabetes mellitus with hyperglycemia (HCC)   Chronic respiratory failure with hypoxia (HCC)   Pressure injury of skin   Pacemaker   Protein-calorie malnutrition, severe   T12 vertebral fracture (HCC)   Hypomagnesemia   Constipation   Bacteremia: One of the bottles showed above organisms.  Etiology could be her left foot chronic wound.  ID was following.  TTE negative, no need for TEE.  ID recommended to continue Augmentin for 4 weeks and outpatient follow-up with ID in 04/26/2022 with Dr Drucilla Schmidt  Chronic left foot osteomyelitis: CT of left foot showed soft tissue wound overlying lateral aspect of fifth metatarsophalangeal joint.  Also showed cortical irregularity of the fifth metatarsal head concerning for osteomyelitis.  Patient was  being followed by ID and was on chronic antibiotic suppression.  Continue Augmentin for now.  Follow-up with ID as an outpatient.   Patient continues to decline surgery, has lack of capacity.  Continue Betadine wet-to-dry dressing twice daily.  Sepsis without acute organ dysfunction: Resolved.  Initially presented with leukocytosis, tachycardia.  Acute cystitis without hematuria: Resolved.  Completed course of antibiotic for UTI.  Urine cultures are negative.  Diabetes type 2 with hyperglycemia: A1c of 12.9.  Likely from noncompliance, lack of capacity.  Continue current insulin regimen.  Monitor blood sugars  Constipation: Continue bowel regimen  T2 vertebral fracture: Unable to perform MRI due to presence of pacemaker.  Patient evaluated  by neurosurgery.  Compression fraction felt to be related to osteoporosis, no associated pain.  Recommended brace for comfort with ambulation.  No surgical intervention recommended.  Continue to work with PT/OT  Hyponatremia: Her sodium level is always borderline low. Continue monitoring,stable  Severe protein calorie moderation: BMI of 21  Patient is status post pacemaker  Chronic respiratory failure with hypoxia: On 3 L of oxygen chronically.  Debility/deconditioning/disposition: Patient from home.  PT/OT recommending SNF.  She declines to go to SNF.  Does not have capacity.  DDS involved.TOC following  Lightheadedness/dizziness: EKG without any concerns.  Blood pressure now stable.  Denies lightheadedness or dizziness today.  Pressure Injury 01/11/22 Sacrum Mid;Medial Stage 1 -  Intact skin with non-blanchable redness of a localized area usually over a bony prominence. (Active)  01/11/22 0226  Location: Sacrum  Location Orientation: Mid;Medial  Staging: Stage 1 -  Intact skin with non-blanchable redness of a localized area usually over a bony prominence.  Wound Description (Comments):   Present on Admission: Yes     Pressure Injury 04/05/22 Hip Anterior;Left;Proximal Stage 2 -  Partial thickness loss of dermis presenting as a shallow open injury with a red, pink wound bed  without slough. Round 1cmx1cm pressure sore, yellow center with pink edges, dry wound bed (Active)  04/05/22 0000  Location: Hip  Location Orientation: Anterior;Left;Proximal  Staging: Stage 2 -  Partial thickness loss of dermis presenting as a shallow open injury with a red, pink wound bed without slough.  Wound Description (Comments): Round 1cmx1cm pressure sore, yellow center with pink edges, dry wound bed  Present on Admission: Yes     Pressure Injury 04/05/22 Toe (Comment  which one) Left;Lateral Unstageable - Full thickness tissue loss in which the base of the injury is covered by slough (yellow, tan, gray, green or brown) and/or eschar (tan, brown or black) in the wound bed. 2cmx1cm (Active)  04/05/22 0000  Location: Toe (Comment  which one) (outer aspect of pinky toe)  Location Orientation: Left;Lateral  Staging: Unstageable - Full thickness tissue loss in which the base of the injury is covered by slough (yellow, tan, gray, green or brown) and/or eschar (tan, brown or black) in the wound bed.  Wound Description (Comments): 2cmx1cm oval shaped wound, covered with grey/brown scabbing which has hair/fuzz? imbedded in it, dry with no drainage  Present on Admission: Yes       Nutrition Problem: Severe Malnutrition Etiology: chronic illness Pressure Injury 01/11/22 Sacrum Mid;Medial Stage 1 -  Intact skin with non-blanchable redness of a localized area usually over a bony prominence. (Active)  01/11/22 0226  Location: Sacrum  Location Orientation: Mid;Medial  Staging: Stage 1 -  Intact skin with non-blanchable redness of a localized area usually over a bony prominence.  Wound Description (Comments):   Present on Admission: Yes  Dressing Type Foam - Lift dressing to assess site every shift 04/22/22 1959     Pressure Injury 04/05/22 Hip Anterior;Left;Proximal Stage 2 -  Partial thickness loss of dermis presenting as a shallow open injury with a red, pink wound bed without slough.  Round 1cmx1cm pressure sore, yellow center with pink edges, dry wound bed (Active)  04/05/22 0000  Location: Hip  Location Orientation: Anterior;Left;Proximal  Staging: Stage 2 -  Partial thickness loss of dermis presenting as a shallow open injury with a red, pink wound bed without slough.  Wound Description (Comments): Round 1cmx1cm pressure sore, yellow center with pink edges, dry wound bed  Present on Admission: Yes  Dressing Type Foam - Lift dressing to assess site every shift 04/22/22 1959     Pressure Injury 04/05/22 Toe (Comment  which one) Left;Lateral Unstageable - Full thickness tissue loss in which the base of the injury is covered by slough (yellow, tan, gray, green or brown) and/or eschar (tan, brown or black) in the wound bed. 2cmx1cm (Active)  04/05/22 0000  Location: Toe (Comment  which one)  Location Orientation: Left;Lateral  Staging: Unstageable - Full thickness tissue loss in which the base of the injury is covered by slough (yellow, tan, gray, green or brown) and/or eschar (tan, brown or black) in the wound bed.  Wound Description (Comments): 2cmx1cm oval shaped wound, covered with grey/brown scabbing which has hair/fuzz? imbedded in it, dry with no drainage  Present on Admission: Yes  Dressing Type Foam - Lift dressing to assess site every shift 04/22/22 1959    DVT prophylaxis:heparin injection 5,000 Units Start: 04/05/22 0600 SCDs Start: 04/05/22 0018     Code Status: DNR  Family Communication: None at bedside  Patient status:Inpatient  Patient is from :Home  Anticipated discharge to:SNF  Estimated DC date:Not sure   Consultants: ID, podiatry, neurosurgery  Procedures:None  Antimicrobials:  Anti-infectives (From admission, onward)    Start     Dose/Rate Route Frequency Ordered Stop   04/12/22 1000  amoxicillin-clavulanate (AUGMENTIN) 875-125 MG per tablet 1 tablet        1 tablet Oral Every 12 hours 04/12/22 0847     04/05/22 1700   Ampicillin-Sulbactam (UNASYN) 3 g in sodium chloride 0.9 % 100 mL IVPB  Status:  Discontinued        3 g 200 mL/hr over 30 Minutes Intravenous Every 6 hours 04/05/22 1643 04/12/22 0847   04/05/22 0800  cefTRIAXone (ROCEPHIN) 1 g in sodium chloride 0.9 % 100 mL IVPB  Status:  Discontinued        1 g 200 mL/hr over 30 Minutes Intravenous Every 24 hours 04/05/22 0017 04/05/22 1643   04/04/22 2145  cefTRIAXone (ROCEPHIN) 1 g in sodium chloride 0.9 % 100 mL IVPB        1 g 200 mL/hr over 30 Minutes Intravenous  Once 04/04/22 2135 04/04/22 2240       Subjective: Patient seen and examined at bedside today.  Hemodynamically stable, comfortable, eating her breakfast.  She constantly says she wants to go home.  Objective: Vitals:   04/23/22 0500 04/23/22 0546 04/23/22 0754 04/23/22 0756  BP:  114/64    Pulse:  89    Resp:  16    Temp:  98.1 F (36.7 C)    TempSrc:  Oral    SpO2:  97% 96% 96%  Weight: 53 kg     Height:        Intake/Output Summary (Last 24 hours) at 04/23/2022 1117 Last data filed at 04/23/2022 0914 Gross per 24 hour  Intake 730 ml  Output 900 ml  Net -170 ml   Filed Weights   04/21/22 0500 04/22/22 0500 04/23/22 0500  Weight: 54 kg 53 kg 53 kg    Examination:  General exam: Overall comfortable, not in distress Respiratory system:  no wheezes or crackles  Cardiovascular system: S1 & S2 heard, RRR.  Gastrointestinal system: Abdomen is nondistended, soft and nontender. Central nervous system: Alert and oriented Extremities: No edema, no clubbing ,no cyanosis,ulcer  on the lateral aspect of the left foot Skin: No rashes, no ulcers,no icterus      Data Reviewed: I have personally reviewed following labs and imaging studies  CBC: Recent Labs  Lab 04/19/22 0735 04/22/22 0444  WBC 7.7 7.4  HGB 12.4 12.2  HCT 40.3 39.5  MCV 87.6 86.6  PLT 253 761   Basic Metabolic Panel: Recent Labs  Lab 04/19/22 0735 04/20/22 0400 04/22/22 0444  NA 129* 130*  134*  K 5.1 4.8 4.0  CL 92* 91* 96*  CO2 29 30 29   GLUCOSE 142* 156* 166*  BUN 14 15 13   CREATININE 0.59 0.65 0.37*  CALCIUM 8.8* 9.0 8.7*     No results found for this or any previous visit (from the past 240 hour(s)).   Radiology Studies: No results found.  Scheduled Meds:  (feeding supplement) PROSource Plus  30 mL Oral BID BM   amoxicillin-clavulanate  1 tablet Oral Q12H   budesonide (PULMICORT) nebulizer solution  0.25 mg Nebulization BID   heparin  5,000 Units Subcutaneous Q8H   insulin aspart  0-15 Units Subcutaneous TID WC   insulin aspart  0-5 Units Subcutaneous QHS   insulin glargine-yfgn  18 Units Subcutaneous QHS  ipratropium-albuterol  3 mL Nebulization BID   lactulose  20 g Oral Daily   lidocaine  1 patch Transdermal Q24H   polyethylene glycol  17 g Oral BID   senna-docusate  1 tablet Oral BID   Continuous Infusions:     LOS: 19 days   Shelly Coss, MD [facility] P12/17/2023, 11:17 AM

## 2022-04-24 DIAGNOSIS — R7881 Bacteremia: Secondary | ICD-10-CM | POA: Diagnosis not present

## 2022-04-24 LAB — GLUCOSE, CAPILLARY
Glucose-Capillary: 187 mg/dL — ABNORMAL HIGH (ref 70–99)
Glucose-Capillary: 180 mg/dL — ABNORMAL HIGH (ref 70–99)
Glucose-Capillary: 225 mg/dL — ABNORMAL HIGH (ref 70–99)
Glucose-Capillary: 161 mg/dL — ABNORMAL HIGH (ref 70–99)

## 2022-04-24 NOTE — TOC Progression Note (Signed)
Transition of Care [facility]) - Progression Note    Patient Details  Name: [name] MRN: [id number] Date of Birth: 1935/10/11  [facility] of Care [facility]) CM/SW [place], LCSW Phone Number: 04/24/2022, 3:43 PM  Clinical Narrative:    DSS still has not taken guardianship over pt. Awaiting court date at this time. TOC to follow.   Expected Discharge Plan: [place] Barriers to Discharge: Continued Medical Work up  Expected Discharge Plan and Services Expected Discharge Plan: [place]   Discharge Planning Services: CM Consult Post Acute Care Choice: [place] Living arrangements for the past 2 months: Apartment                                       Social Determinants of Health (SDOH) Interventions    Readmission Risk Interventions     No data to display

## 2022-04-24 NOTE — Progress Notes (Signed)
PROGRESS NOTE  [name]  NUU:[id number] DOB: 01/30/1936 DOA: 04/04/2022 PCP: Wardell Honour, MD   Brief Narrative: Patient is 86 year old female with history of CVA, BPPV, arthritis, COPD, diabetes type 2, hypertension, glaucoma , HLD, IBS, left foot osteomyelitis, spinal stenosis, CHB s/p PPM, chronic hypoxia on 3L home O2 who initially presented with altered mental status.  On presentation ,she was hypoglycemic and suspected to have UTI.  Started on antibiotics.  Her blood cultures were Enterococcus faecalis, Rothia Mucilaginosa.  ID was consulted.  Repeat blood cultures have been negative.She wants to go home  Due to lack of capacity, psychiatry was also consulted.  Deemed not to have capacity regarding her decision-making ability.  Plan for skilled nursing facility.  Waiting for placement.[facility] in process to get the guardianship paperwork. TOC following  Assessment & Plan:  Principal Problem:   Bacteremia Active Problems:   Lacks decision making capacity   Chronic osteomyelitis of left foot ([place])   Uncontrolled type 2 diabetes mellitus with hyperglycemia (HCC)   Chronic respiratory failure with hypoxia (HCC)   Pressure injury of skin   Pacemaker   Protein-calorie malnutrition, severe   T12 vertebral fracture (HCC)   Hypomagnesemia   Constipation   Bacteremia: One of the bottles showed above organisms.  Etiology could be her left foot chronic wound.  ID was following.  TTE negative, no need for TEE.  ID recommended to continue Augmentin for 4 weeks and outpatient follow-up with ID in 04/26/2022 with Dr Drucilla Schmidt  Chronic left foot osteomyelitis: CT of left foot showed soft tissue wound overlying lateral aspect of fifth metatarsophalangeal joint.  Also showed cortical irregularity of the fifth metatarsal head concerning for osteomyelitis.  Patient was  being followed by ID and was on chronic antibiotic suppression.  Continue Augmentin for now.  Follow-up with ID as an outpatient.   Patient continues to decline surgery, has lack of capacity.  Continue Betadine wet-to-dry dressing twice daily.  Sepsis without acute organ dysfunction: Resolved.  Initially presented with leukocytosis, tachycardia.  Acute cystitis without hematuria: Resolved.  Completed course of antibiotic for UTI.  Urine cultures are negative.  Diabetes type 2 with hyperglycemia: A1c of 12.9.  Likely from noncompliance, lack of capacity.  Continue current insulin regimen.  Monitor blood sugars  Constipation: Continue bowel regimen  T2 vertebral fracture: Unable to perform MRI due to presence of pacemaker.  Patient evaluated  by neurosurgery.  Compression fraction felt to be related to osteoporosis, no associated pain.  Recommended brace for comfort with ambulation.  No surgical intervention recommended.  Continue to work with PT/OT  Hyponatremia: Her sodium level is always borderline low. Continue monitoring,stable  Severe protein calorie moderation: BMI of 21  Patient is status post pacemaker  Chronic respiratory failure with hypoxia: On 3 L of oxygen chronically.  Debility/deconditioning/disposition: Patient from home.  PT/OT recommending SNF.  She declines to go to SNF.  Does not have capacity.  DDS involved.TOC following  Lightheadedness/dizziness: EKG without any concerns.  Blood pressure now stable.  Denies lightheadedness or dizziness today.  Pressure Injury 01/11/22 Sacrum Mid;Medial Stage 1 -  Intact skin with non-blanchable redness of a localized area usually over a bony prominence. (Active)  01/11/22 0226  Location: Sacrum  Location Orientation: Mid;Medial  Staging: Stage 1 -  Intact skin with non-blanchable redness of a localized area usually over a bony prominence.  Wound Description (Comments):   Present on Admission: Yes     Pressure Injury 04/05/22 Hip Anterior;Left;Proximal Stage 2 -  Partial thickness loss of dermis presenting as a shallow open injury with a red, pink wound bed  without slough. Round 1cmx1cm pressure sore, yellow center with pink edges, dry wound bed (Active)  04/05/22 0000  Location: Hip  Location Orientation: Anterior;Left;Proximal  Staging: Stage 2 -  Partial thickness loss of dermis presenting as a shallow open injury with a red, pink wound bed without slough.  Wound Description (Comments): Round 1cmx1cm pressure sore, yellow center with pink edges, dry wound bed  Present on Admission: Yes     Pressure Injury 04/05/22 Toe (Comment  which one) Left;Lateral Unstageable - Full thickness tissue loss in which the base of the injury is covered by slough (yellow, tan, gray, green or brown) and/or eschar (tan, brown or black) in the wound bed. 2cmx1cm (Active)  04/05/22 0000  Location: Toe (Comment  which one) (outer aspect of pinky toe)  Location Orientation: Left;Lateral  Staging: Unstageable - Full thickness tissue loss in which the base of the injury is covered by slough (yellow, tan, gray, green or brown) and/or eschar (tan, brown or black) in the wound bed.  Wound Description (Comments): 2cmx1cm oval shaped wound, covered with grey/brown scabbing which has hair/fuzz? imbedded in it, dry with no drainage  Present on Admission: Yes       Nutrition Problem: Severe Malnutrition Etiology: chronic illness Pressure Injury 01/11/22 Sacrum Mid;Medial Stage 1 -  Intact skin with non-blanchable redness of a localized area usually over a bony prominence. (Active)  01/11/22 0226  Location: Sacrum  Location Orientation: Mid;Medial  Staging: Stage 1 -  Intact skin with non-blanchable redness of a localized area usually over a bony prominence.  Wound Description (Comments):   Present on Admission: Yes  Dressing Type Foam - Lift dressing to assess site every shift 04/24/22 0037     Pressure Injury 04/05/22 Hip Anterior;Left;Proximal Stage 2 -  Partial thickness loss of dermis presenting as a shallow open injury with a red, pink wound bed without slough.  Round 1cmx1cm pressure sore, yellow center with pink edges, dry wound bed (Active)  04/05/22 0000  Location: Hip  Location Orientation: Anterior;Left;Proximal  Staging: Stage 2 -  Partial thickness loss of dermis presenting as a shallow open injury with a red, pink wound bed without slough.  Wound Description (Comments): Round 1cmx1cm pressure sore, yellow center with pink edges, dry wound bed  Present on Admission: Yes  Dressing Type Foam - Lift dressing to assess site every shift 04/24/22 0037     Pressure Injury 04/05/22 Toe (Comment  which one) Left;Lateral Unstageable - Full thickness tissue loss in which the base of the injury is covered by slough (yellow, tan, gray, green or brown) and/or eschar (tan, brown or black) in the wound bed. 2cmx1cm (Active)  04/05/22 0000  Location: Toe (Comment  which one)  Location Orientation: Left;Lateral  Staging: Unstageable - Full thickness tissue loss in which the base of the injury is covered by slough (yellow, tan, gray, green or brown) and/or eschar (tan, brown or black) in the wound bed.  Wound Description (Comments): 2cmx1cm oval shaped wound, covered with grey/brown scabbing which has hair/fuzz? imbedded in it, dry with no drainage  Present on Admission: Yes  Dressing Type Foam - Lift dressing to assess site every shift 04/24/22 0037    DVT prophylaxis:heparin injection 5,000 Units Start: 04/05/22 0600 SCDs Start: 04/05/22 0018     Code Status: DNR  Family Communication: None at bedside  Patient status:Inpatient  Patient is from :Home  Anticipated discharge to:SNF  Estimated DC date:Not sure   Consultants: ID, podiatry, neurosurgery  Procedures:None  Antimicrobials:  Anti-infectives (From admission, onward)    Start     Dose/Rate Route Frequency Ordered Stop   04/12/22 1000  amoxicillin-clavulanate (AUGMENTIN) 875-125 MG per tablet 1 tablet        1 tablet Oral Every 12 hours 04/12/22 0847     04/05/22 1700   Ampicillin-Sulbactam (UNASYN) 3 g in sodium chloride 0.9 % 100 mL IVPB  Status:  Discontinued        3 g 200 mL/hr over 30 Minutes Intravenous Every 6 hours 04/05/22 1643 04/12/22 0847   04/05/22 0800  cefTRIAXone (ROCEPHIN) 1 g in sodium chloride 0.9 % 100 mL IVPB  Status:  Discontinued        1 g 200 mL/hr over 30 Minutes Intravenous Every 24 hours 04/05/22 0017 04/05/22 1643   04/04/22 2145  cefTRIAXone (ROCEPHIN) 1 g in sodium chloride 0.9 % 100 mL IVPB        1 g 200 mL/hr over 30 Minutes Intravenous  Once 04/04/22 2135 04/04/22 2240       Subjective: Patient seen and examined at bedside today.  Hemodynamically stable.  Lying in bed.  Denies new complaints.  Says she wants to go home and is asking when she can do that  Objective: Vitals:   04/24/22 0500 04/24/22 0512 04/24/22 0919 04/24/22 1257  BP:  111/68  125/63  Pulse:  99  93  Resp:  20  20  Temp:  98.8 F (37.1 C)  98.3 F (36.8 C)  TempSrc:  Oral  Oral  SpO2:  100% 100% 98%  Weight: 53.4 kg     Height:        Intake/Output Summary (Last 24 hours) at 04/24/2022 1303 Last data filed at 04/24/2022 1259 Gross per 24 hour  Intake 940 ml  Output 200 ml  Net 740 ml   Filed Weights   04/22/22 0500 04/23/22 0500 04/24/22 0500  Weight: 53 kg 53 kg 53.4 kg    Examination:   General exam: Overall comfortable, not in distress,peasant elderly female HEENT: PERRL Respiratory system:  no wheezes or crackles  Cardiovascular system: S1 & S2 heard, RRR.  Gastrointestinal system: Abdomen is nondistended, soft and nontender. Central nervous system: Alert and oriented Extremities: No edema, no clubbing ,no cyanosis,ulcer on the latera; aspect of left foot Skin: No rashes, no ulcers,no icterus    Data Reviewed: I have personally reviewed following labs and imaging studies  CBC: Recent Labs  Lab 04/19/22 0735 04/22/22 0444  WBC 7.7 7.4  HGB 12.4 12.2  HCT 40.3 39.5  MCV 87.6 86.6  PLT 253 163   Basic Metabolic  Panel: Recent Labs  Lab 04/19/22 0735 04/20/22 0400 04/22/22 0444  NA 129* 130* 134*  K 5.1 4.8 4.0  CL 92* 91* 96*  CO2 29 30 29   GLUCOSE 142* 156* 166*  BUN 14 15 13   CREATININE 0.59 0.65 0.37*  CALCIUM 8.8* 9.0 8.7*     No results found for this or any previous visit (from the past 240 hour(s)).   Radiology Studies: No results found.  Scheduled Meds:  (feeding supplement) PROSource Plus  30 mL Oral BID BM   amoxicillin-clavulanate  1 tablet Oral Q12H   budesonide (PULMICORT) nebulizer solution  0.25 mg Nebulization BID   heparin  5,000 Units Subcutaneous Q8H   insulin aspart  0-15 Units Subcutaneous TID WC   insulin aspart  0-5 Units Subcutaneous QHS   insulin glargine-yfgn  18 Units Subcutaneous QHS   ipratropium-albuterol  3 mL Nebulization BID   lactulose  20 g Oral Daily   lidocaine  1 patch Transdermal Q24H   polyethylene glycol  17 g Oral BID   senna-docusate  1 tablet Oral BID   Continuous Infusions:     LOS: 20 days   Shelly Coss, MD [facility] P12/18/2023, 1:03 PM

## 2022-04-24 NOTE — Plan of Care (Signed)
  Problem: Education: Goal: Knowledge of General Education information will improve Description Including pain rating scale, medication(s)/side effects and non-pharmacologic comfort measures Outcome: Progressing   Problem: Health Behavior/Discharge Planning: Goal: Ability to manage health-related needs will improve Outcome: Progressing   

## 2022-04-24 NOTE — Progress Notes (Signed)
PT Cancellation Note  Patient Details Name: NADJA LINA MRN: [id number] DOB: [birth year]   Cancelled Treatment:     Pt declined any OOB activity requesting to rest.  Pt has been evaluated with rec for SNF  Rica Koyanagi  PTA [place] [facility] M-F          [phone] Weekend pager [phone]

## 2022-04-25 DIAGNOSIS — R7881 Bacteremia: Secondary | ICD-10-CM | POA: Diagnosis not present

## 2022-04-25 LAB — CBC
HCT: 42.5 % (ref 36.0–46.0)
Hemoglobin: 13.1 g/dL (ref 12.0–15.0)
MCH: 27.1 pg (ref 26.0–34.0)
MCHC: 30.8 g/dL (ref 30.0–36.0)
MCV: 88 fL (ref 80.0–100.0)
Platelets: 194 10*3/uL (ref 150–400)
RBC: 4.83 MIL/uL (ref 3.87–5.11)
RDW: 14.9 % (ref 11.5–15.5)
WBC: 10.1 10*3/uL (ref 4.0–10.5)
nRBC: 0 % (ref 0.0–0.2)

## 2022-04-25 LAB — GLUCOSE, CAPILLARY
Glucose-Capillary: 87 mg/dL (ref 70–99)
Glucose-Capillary: 224 mg/dL — ABNORMAL HIGH (ref 70–99)
Glucose-Capillary: 192 mg/dL — ABNORMAL HIGH (ref 70–99)
Glucose-Capillary: 198 mg/dL — ABNORMAL HIGH (ref 70–99)

## 2022-04-25 LAB — BASIC METABOLIC PANEL
Anion gap: 8 (ref 5–15)
BUN: 11 mg/dL (ref 8–23)
CO2: 30 mmol/L (ref 22–32)
Calcium: 9.2 mg/dL (ref 8.9–10.3)
Chloride: 97 mmol/L — ABNORMAL LOW (ref 98–111)
Creatinine, Ser: 0.42 mg/dL — ABNORMAL LOW (ref 0.44–1.00)
GFR, Estimated: >60 mL/min (ref >60)
Glucose, Bld: 97 mg/dL (ref 70–99)
Potassium: 4.4 mmol/L (ref 3.5–5.1)
Sodium: 135 mmol/L (ref 135–145)

## 2022-04-25 MED ORDER — BISACODYL 10 MG RE SUPP
10.0000 mg | Freq: Once | RECTAL | Status: AC
  Administered 2022-04-25: 10 mg via RECTAL
  Filled 2022-04-25: qty 1

## 2022-04-25 MED ORDER — METOPROLOL SUCCINATE ER 25 MG PO TB24
12.5000 mg | ORAL_TABLET | Freq: Every day | ORAL | Status: DC
  Administered 2022-04-25 – 2022-04-27 (×3): 12.5 mg via ORAL
  Filled 2022-04-25 (×3): qty 1

## 2022-04-25 NOTE — Progress Notes (Signed)
Nutrition Follow-up  DOCUMENTATION CODES:   Severe malnutrition in context of chronic illness  INTERVENTION:  - Continue DYS 3 diet as medically appropriate.  - Will discontinue Prosource Plus BID as patient has not been consuming. - Continue Mighty Shake with breakfast, provides 330 kcals and 9 grams of protein - Continue Magic Cup with dinner, provides 290 kcal and 9 grams of protein - Encourage intake at all meals and of supplements.  - Monitor for glycemic control. - Monitor weights daily to trend.    NUTRITION DIAGNOSIS:   Severe Malnutrition related to chronic illness as evidenced by severe fat depletion, severe muscle depletion, percent weight loss. *ongoing  GOAL:   Patient will meet greater than or equal to 90% of their needs *not being met   MONITOR:   PO intake, Supplement acceptance, Weight trends  REASON FOR ASSESSMENT:   Malnutrition Screening Tool    ASSESSMENT:   86 yo WF with hx of uncontrolled type 2 DM, history of complete heart block status post pacemaker, chronic protein calorie malnutrition, chronic respiratory failure on home oxygen to 3 L admitted acute cystitis without hematuria, T12 vertebral fracture, sepsis, and acute metabolic encephalopathy.  Patient is documented to be eating mostly 50-100% of meals. Average of 59% of meals over the past 4 days. She has not been consuming the oral Prosource. Weight mostly consistent, without significant changes since admission.  Last BM yesterday.  Medications reviewed and include: Insulin, Lactulose, Miralax, Senokot, Zofran prn  Labs reviewed:  - Blood glucose 87-225 x24 hours   Diet Order:   Diet Order             DIET DYS 3 Room service appropriate? Yes; Fluid consistency: Thin  Diet effective now                   EDUCATION NEEDS:  No education needs have been identified at this time  Skin:  Skin Assessment: Reviewed RN Assessment Skin Integrity Issues:: Stage II, Unstageable,  Stage I Stage I: Mid Sacrum Stage II: L Hip Unstageable: L Toe  Last BM:  12/18  Height:  Ht Readings from Last 1 Encounters:  04/05/22 _0  (1.6 m)   Weight:  Wt Readings from Last 1 Encounters:  04/25/22 52 kg    BMI:  Body mass index is 20.31 kg/m.  Estimated Nutritional Needs:  Kcal:  1650-1900 kcal Protein:  70-95 grams Fluid:  >/= 1.6L    Samson Frederic RD, LDN For contact information, refer to [facility].

## 2022-04-25 NOTE — TOC Progression Note (Signed)
Transition of Care [facility]) - Progression Note    Patient Details  Name: JERMANI PUND MRN: [id number] Date of Birth: 1936-03-19  [facility] CM/SW Contact  Joaquin Courts, RN Phone Number: 04/25/2022, 3:21 PM  Clinical Narrative:    CM has received call from APS, court hearing is scheduled for Thursday at 9 am and per APS is judge grants the order then they will be able to select a facility for patient and sign her in.  TOC awaiting outcome of court hearing.   Expected Discharge Plan: [place] Barriers to Discharge: Continued Medical Work up  Expected Discharge Plan and Services Expected Discharge Plan: [place]   Discharge Planning Services: CM Consult Post Acute Care Choice: [place] Living arrangements for the past 2 months: Apartment                                       Social Determinants of Health (SDOH) Interventions    Readmission Risk Interventions     No data to display

## 2022-04-25 NOTE — Progress Notes (Deleted)
Subjective:     Patient ID: [name], female    DOB: 1935/07/03, 86 y.o.   MRN: [id number]  HPI  86 year old with hx of hemic stroke, complete heart block with pacemaker  cognitive impairment with diabetes mellitus and peripheral vascular disease,  with poor blood flow with chronic wounds he has now been found to have osteomyelitis of the fifth metatarsal left foot.  She unfortunately has very poorly controlled diabetes mellitus and is admitted to the hospital nearly monthly due to problems with hyperglycemia.  She is being followed by wound care and it sounds as if she was followed by podiatry in the past.  She comes today to clinic accompanied by her home health aide.  She believes she is on doxycycline currently and has been on that for roughly a month.  She has not seen podiatry or orthopedic surgery.  She does have an appoint with Dr. Alvester Chou with cardiology.  Of note her toe brachial indices were not optimal on the left side in particular.    She also has had diabetic foot ulcers in the opposite foot as well which have gone quite deep by her description.  I can see that she had plain films performed in July of that foot which did not show osteomyelitis radiographically.   I did explain to her that her osteomyelitis would NOT curable with IV or oral antibiotics but that we could potentially improve her current condition with antibiotics and hopefully her pain which is fairly significant in her left foot.  She is seeing Kalman Shan with Wound care.  I do think she should at least see a podiatrist or an orthopedic foot surgeon even if she is not at all eager for surgery.  Certainly her vascular status would also need to be more fully evaluated and optimized if she was going to have an amputation.      Past Medical History:  Diagnosis Date   Acute ischemic stroke ([place]) 08/10/2021   Anxiety disorder, unspecified    Arthritis    Benign paroxysmal positional  vertigo 08/09/2012   Chronic rhinitis    Cognitive communication deficit    Constipation, unspecified    COPD mixed type ([place])    on home O2   Diverticulosis    DKA (diabetic ketoacidosis) ([place]) 10/02/2021   Esophageal stricture    Essential (primary) hypertension    Gastro-esophageal reflux disease without esophagitis    Glaucoma    Hiatal hernia    Hyperlipidemia, unspecified    Irritable bowel syndrome    Ischemic stroke ([place]) 08/09/2021   Osteomyelitis of fifth toe of left foot ([place]) 03/14/2022   Presence of permanent cardiac pacemaker 01/14/2020   Spinal stenosis, lumbar region, without neurogenic claudication    Steatohepatitis    Thrombocytopenia, unspecified ([place])    Type 2 diabetes mellitus with diabetic polyneuropathy ([place])     Past Surgical History:  Procedure Laterality Date   APPENDECTOMY     CARPAL TUNNEL RELEASE     right hand   CHOLECYSTECTOMY OPEN  1978   COLONOSCOPY  07-2001   mild diverticulosis   ESOPHAGOGASTRODUODENOSCOPY  4081,44-81   H Hernia,es.stricture s/p dil 13F   INTRAOCULAR LENS INSERTION Bilateral    LIVER BIOPSY  [id number]   PACEMAKER IMPLANT N/A 01/14/2020   Procedure: PACEMAKER IMPLANT;  Surgeon: Evans Lance, MD;  Location: [facility];  Service: Cardiovascular;  Laterality: N/A;   PARATHYROID EXPLORATION     TONSILLECTOMY AND ADENOIDECTOMY  TOTAL ABDOMINAL HYSTERECTOMY     ULNAR NERVE TRANSPOSITION  12/07/2011   Procedure: ULNAR NERVE DECOMPRESSION/TRANSPOSITION;this was cancelled-not done  Surgeon: Cammie Sickle., MD;  Location: [facility];  Service: Orthopedics;  Laterality: Right;  right ulnar nerve in situ decompression   ULNAR TUNNEL RELEASE  03/07/2012   Procedure: CUBITAL TUNNEL RELEASE;  Surgeon: Roseanne Kaufman, MD;  Location: [facility];  Service: Orthopedics;  Laterality: Right;  ulnar nerve release at the elbow      Family History  Problem Relation Age of Onset   Heart disease  Father    Lung cancer Mother        small cell;Byssinosis   Lung cancer Sister    Liver cancer Sister        ? mets from another area of the body   Diabetes Other        grandmother   Stroke Maternal Grandfather       Social History   Socioeconomic History   Marital status: Single    Spouse name: John,divorced, deceased   Number of children: 0   Years of education: high school   Highest education level: High school graduate  Occupational History   Occupation: Retired     Comment: Training and development officer  Tobacco Use   Smoking status: Former    Types: Cigarettes    Quit date: 05/08/1988    Years since quitting: 33.9   Smokeless tobacco: Never  Vaping Use   Vaping Use: Never used  Substance and Sexual Activity   Alcohol use: No    Alcohol/week: 0.0 standard drinks of alcohol   Drug use: No   Sexual activity: Not Currently  Other Topics Concern   Not on file  Social History Narrative   Not on file   Social Determinants of Health   Financial Resource Strain: Low Risk  (10/24/2021)   Overall Financial Resource Strain (CARDIA)    Difficulty of Paying Living Expenses: Not hard at all  Food Insecurity: No Food Insecurity (01/16/2022)   Hunger Vital Sign    Worried About Running Out of Food in the Last Year: Never true    [place] in the Last Year: Never true  Transportation Needs: No Transportation Needs (01/16/2022)   PRAPARE - Hydrologist (Medical): No    Lack of Transportation (Non-Medical): No  Recent Concern: Transportation Needs - Unmet Transportation Needs (10/24/2021)   PRAPARE - Transportation    Lack of Transportation (Medical): Yes    Lack of Transportation (Non-Medical): Yes  Physical Activity: Not on file  Stress: No Stress Concern Present (04/28/2020)   [facility]    Feeling of Stress : Only a little  Social Connections: Moderately Isolated (02/17/2020)   Social  Connection and Isolation Panel [NHANES]    Frequency of Communication with Friends and Family: More than three times a week    Frequency of Social Gatherings with Friends and Family: More than three times a week    Attends Religious Services: More than 4 times per year    Active Member of [employer] or Organizations: No    Attends Music therapist: Patient refused    Marital Status: Divorced    Allergies  Allergen Reactions   Chlordiazepoxide-Clidinium Other (See Comments)    Sleepy,weak   Biaxin [Clarithromycin] Other (See Comments)    Foul taste, abd pain, diarrhea   Tape Other (See Comments)  SKIN IS VERY THIN AND WILL TEAR AND BRUISE EASILY!!   Ultram [Tramadol] Other (See Comments)    Caused tremors   Codeine Other (See Comments)    Upset the stomach    No current facility-administered medications for this visit. No current outpatient medications on file.  Facility-Administered Medications Ordered in Other Visits:    (feeding supplement) PROSource Plus liquid 30 mL, 30 mL, Oral, BID BM, Dwyane Dee, MD, 30 mL at 04/25/22 1745   acetaminophen (TYLENOL) suppository 650 mg, 650 mg, Rectal, Q4H PRN, Kristopher Oppenheim, DO   acetaminophen (TYLENOL) tablet 650 mg, 650 mg, Oral, Q6H PRN, Raiford Noble Latif, DO, 650 mg at 04/18/22 1325   albuterol (PROVENTIL) (2.5 MG/3ML) 0.083% nebulizer solution 2.5 mg, 2.5 mg, Inhalation, Q6H PRN, Alfredia Ferguson, Omair Latif, DO, 2.5 mg at 04/25/22 1406   amoxicillin-clavulanate (AUGMENTIN) 875-125 MG per tablet 1 tablet, 1 tablet, Oral, Q12H, Dwyane Dee, MD, 1 tablet at 04/25/22 1042   bisacodyl (DULCOLAX) suppository 10 mg, 10 mg, Rectal, Daily PRN, Alfredia Ferguson, Omair Latif, DO   budesonide (PULMICORT) nebulizer solution 0.25 mg, 0.25 mg, Nebulization, BID, Kristopher Oppenheim, DO, 0.25 mg at 04/25/22 0855   heparin injection 5,000 Units, 5,000 Units, Subcutaneous, Q8H, Kristopher Oppenheim, DO, 5,000 Units at 04/25/22 1745   insulin aspart (novoLOG) injection 0-15  Units, 0-15 Units, Subcutaneous, TID WC, Dwyane Dee, MD, 3 Units at 04/25/22 1730   insulin aspart (novoLOG) injection 0-5 Units, 0-5 Units, Subcutaneous, QHS, Dwyane Dee, MD, 4 Units at 04/23/22 2304   insulin glargine-yfgn (SEMGLEE) injection 18 Units, 18 Units, Subcutaneous, QHS, Girguis, Shanon Brow, MD, 18 Units at 04/24/22 2200   ipratropium-albuterol (DUONEB) 0.5-2.5 (3) MG/3ML nebulizer solution 3 mL, 3 mL, Nebulization, BID, Dibia, Manfred Shirts, MD, 3 mL at 04/25/22 0855   lactulose (CHRONULAC) 10 GM/15ML solution 20 g, 20 g, Oral, Daily, Girguis, Shanon Brow, MD, 20 g at 04/25/22 1042   lidocaine (LIDODERM) 5 % 1 patch, 1 patch, Transdermal, Q24H, Loistine Chance, MD, 1 patch at 04/25/22 1756   meclizine (ANTIVERT) tablet 12.5 mg, 12.5 mg, Oral, TID PRN, Shelly Coss, MD   metoprolol succinate (TOPROL-XL) 24 hr tablet 12.5 mg, 12.5 mg, Oral, Daily, Adhikari, Amrit, MD, 12.5 mg at 04/25/22 1042   ondansetron (ZOFRAN) injection 4 mg, 4 mg, Intravenous, Q6H PRN, Kristopher Oppenheim, DO, 4 mg at 04/25/22 0539   polyethylene glycol (MIRALAX / GLYCOLAX) packet 17 g, 17 g, Oral, BID, Sheikh, Omair Latif, DO, 17 g at 04/25/22 1042   senna-docusate (Senokot-S) tablet 1 tablet, 1 tablet, Oral, BID, Sheikh, Omair [place], DO, 1 tablet at 04/25/22 1042   Review of Systems  Unable to perform ROS: Dementia       Objective:   Physical Exam HENT:     Nose: Nose normal.  Eyes:     General:        Right eye: No discharge.        Left eye: No discharge.     Extraocular Movements: Extraocular movements intact.  Cardiovascular:     Rate and Rhythm: Normal rate.  Pulmonary:     Effort: Pulmonary effort is normal. No respiratory distress.     Breath sounds: No wheezing.  Musculoskeletal:        General: Normal range of motion.  Skin:    Coloration: Skin is pale.  Neurological:     General: No focal deficit present.     Mental Status: She is alert and oriented to person, place, and time.  Psychiatric:  Mood and Affect: Mood normal.        Behavior: Behavior normal.        Thought Content: Thought content normal.        Judgment: Judgment normal.     03/14/2022:  Left foot       Right foot 03/14/2022:           Assessment & Plan:   Osteomyelitis 5th metatarsal left foot:  She had normal inflammatory markers when checked a month ago and may be in the 10% of people who have normal inflammatory markers despite osteomyelitis.  We will continue her doxycycline and add cefadroxil to better cover strep species.  There will remain risk that could have more proximal spread of infection or even systemic infection and bloodstream infections including infection and seeding of her pacemaker.  I will have her come back and see me in 1 month time.  I also made a referral to Wylene Simmer with orthopedic surgery with [facility].  She will see Dr. Alvester Chou from Cardiology soon as well  History of wounds in the opposite foot that went fairly deep:  Would also consider imaging this area more aggressively though it the moment the osteomyelitis of left foot is the more pressing issue  I spent 82 minutes with the patient including than 50% of the time in face to face counseling of the patient regarding her osteomyelitis diabetes mellitus peripheral vascular disease personally reviewing to the foot without contrast along with review of medical records in preparation for the visit and during the visit and in coordination of her care.

## 2022-04-25 NOTE — TOC Progression Note (Signed)
Transition of Care [facility]) - Progression Note    Patient Details  Name: [name] MRN: [id number] Date of Birth: 08-13-1935  Transition of Care [facility] CM/SW Contact  Joaquin Courts, RN Phone Number: 04/25/2022, 9:52 AM  Clinical Narrative:    CM outreached to APS CSW and confirmed that she is in route to file court paperwork for an ex-parte emergency custody hearing.  Per APS anticipate a turn around within the week but she will update the CM with the actual court date once it has been assigned.   Expected Discharge Plan: [place] Barriers to Discharge: Continued Medical Work up  Expected Discharge Plan and Services Expected Discharge Plan: [place]   Discharge Planning Services: CM Consult Post Acute Care Choice: [place] Living arrangements for the past 2 months: Apartment                                       Social Determinants of Health (SDOH) Interventions    Readmission Risk Interventions     No data to display

## 2022-04-25 NOTE — Progress Notes (Signed)
PROGRESS NOTE  [name]  CHE:[id number] DOB: Dec 14, 1935 DOA: 04/04/2022 PCP: Wardell Honour, MD   Brief Narrative: Patient is 86 year old female with history of CVA, BPPV, arthritis, COPD, diabetes type 2, hypertension, glaucoma , HLD, IBS, left foot osteomyelitis, spinal stenosis, CHB s/p PPM, chronic hypoxia on 3L home O2 who initially presented with altered mental status.  On presentation ,she was hypoglycemic and suspected to have UTI.  Started on antibiotics.  Her blood cultures were Enterococcus faecalis, Rothia Mucilaginosa.  ID was consulted.  Repeat blood cultures have been negative.She wants to go home  Due to lack of capacity, psychiatry was also consulted.  Deemed not to have capacity regarding her decision-making ability.  Plan for skilled nursing facility.  Waiting for placement.APS in process to get the guardianship paperwork. TOC following  Assessment & Plan:  Principal Problem:   Bacteremia Active Problems:   Lacks decision making capacity   Chronic osteomyelitis of left foot ([place])   Uncontrolled type 2 diabetes mellitus with hyperglycemia (H[facility])   Chronic respiratory failure with hypoxia ([facility])   Pressure injury of skin   Pacemaker   Protein-calorie malnutrition, severe   T12 vertebral fracture ([facility])   Hypomagnesemia   Constipation   Bacteremia: One of the bottles showed above organisms.  Etiology could be her left foot chronic wound.  ID was following.  TTE negative, no need for TEE.  ID recommended to continue Augmentin for 4 weeks and outpatient follow-up with ID in 04/26/2022 with Dr Drucilla Schmidt  Chronic left foot osteomyelitis: CT of left foot showed soft tissue wound overlying lateral aspect of fifth metatarsophalangeal joint.  Also showed cortical irregularity of the fifth metatarsal head concerning for osteomyelitis.  Patient was  being followed by ID and was on chronic antibiotic suppression.  Continue Augmentin as per ID.  Follow-up with ID as an outpatient.   Patient continues to decline surgery, has lack of capacity.  Continue Betadine wet-to-dry dressing twice daily.  Sepsis without acute organ dysfunction: Resolved.  Initially presented with leukocytosis, tachycardia.  Acute cystitis without hematuria: Resolved.  Completed course of antibiotic for UTI.  Urine cultures are negative.  Diabetes type 2 with hyperglycemia: A1c of 12.9.  Likely from noncompliance, lack of capacity.  Continue current insulin regimen.  Monitor blood sugars  Constipation: Continue bowel regimen.  Abdomen appears distended today.  Dulcolax suppository  T2 vertebral fracture: Unable to perform MRI due to presence of pacemaker.  Patient evaluated  by neurosurgery.  Compression fraction felt to be related to osteoporosis, no associated pain.  Recommended brace for comfort with ambulation.  No surgical intervention recommended.  Continue to work with PT/OT  Severe protein calorie moderation: BMI of 21  History of complete heart block: Patient is status post pacemaker.  Remains in mild sinus tachycardia, paced rhythm.  Was taking metoprolol at home.  Blood pressure soft so restarting on low-dose  Chronic respiratory failure with hypoxia: On 3 L of oxygen chronically.  Debility/deconditioning/disposition: Patient from home.  PT/OT recommending SNF.  She declines to go to SNF.  Does not have capacity.  DDS involved.TOC following  Pressure Injury 01/11/22 Sacrum Mid;Medial Stage 1 -  Intact skin with non-blanchable redness of a localized area usually over a bony prominence. (Active)  01/11/22 0226  Location: Sacrum  Location Orientation: Mid;Medial  Staging: Stage 1 -  Intact skin with non-blanchable redness of a localized area usually over a bony prominence.  Wound Description (Comments):   Present on Admission: Yes  Pressure Injury 04/05/22 Hip Anterior;Left;Proximal Stage 2 -  Partial thickness loss of dermis presenting as a shallow open injury with a red, pink wound  bed without slough. Round 1cmx1cm pressure sore, yellow center with pink edges, dry wound bed (Active)  04/05/22 0000  Location: Hip  Location Orientation: Anterior;Left;Proximal  Staging: Stage 2 -  Partial thickness loss of dermis presenting as a shallow open injury with a red, pink wound bed without slough.  Wound Description (Comments): Round 1cmx1cm pressure sore, yellow center with pink edges, dry wound bed  Present on Admission: Yes     Pressure Injury 04/05/22 Toe (Comment  which one) Left;Lateral Unstageable - Full thickness tissue loss in which the base of the injury is covered by slough (yellow, tan, gray, green or brown) and/or eschar (tan, brown or black) in the wound bed. 2cmx1cm (Active)  04/05/22 0000  Location: Toe (Comment  which one) (outer aspect of pinky toe)  Location Orientation: Left;Lateral  Staging: Unstageable - Full thickness tissue loss in which the base of the injury is covered by slough (yellow, tan, gray, green or brown) and/or eschar (tan, brown or black) in the wound bed.  Wound Description (Comments): 2cmx1cm oval shaped wound, covered with grey/brown scabbing which has hair/fuzz? imbedded in it, dry with no drainage  Present on Admission: Yes       Nutrition Problem: Severe Malnutrition Etiology: chronic illness Pressure Injury 01/11/22 Sacrum Mid;Medial Stage 1 -  Intact skin with non-blanchable redness of a localized area usually over a bony prominence. (Active)  01/11/22 0226  Location: Sacrum  Location Orientation: Mid;Medial  Staging: Stage 1 -  Intact skin with non-blanchable redness of a localized area usually over a bony prominence.  Wound Description (Comments):   Present on Admission: Yes  Dressing Type Foam - Lift dressing to assess site every shift 04/24/22 0037     Pressure Injury 04/05/22 Hip Anterior;Left;Proximal Stage 2 -  Partial thickness loss of dermis presenting as a shallow open injury with a red, pink wound bed without slough.  Round 1cmx1cm pressure sore, yellow center with pink edges, dry wound bed (Active)  04/05/22 0000  Location: Hip  Location Orientation: Anterior;Left;Proximal  Staging: Stage 2 -  Partial thickness loss of dermis presenting as a shallow open injury with a red, pink wound bed without slough.  Wound Description (Comments): Round 1cmx1cm pressure sore, yellow center with pink edges, dry wound bed  Present on Admission: Yes  Dressing Type Foam - Lift dressing to assess site every shift 04/25/22 0900     Pressure Injury 04/05/22 Toe (Comment  which one) Left;Lateral Unstageable - Full thickness tissue loss in which the base of the injury is covered by slough (yellow, tan, gray, green or brown) and/or eschar (tan, brown or black) in the wound bed. 2cmx1cm (Active)  04/05/22 0000  Location: Toe (Comment  which one)  Location Orientation: Left;Lateral  Staging: Unstageable - Full thickness tissue loss in which the base of the injury is covered by slough (yellow, tan, gray, green or brown) and/or eschar (tan, brown or black) in the wound bed.  Wound Description (Comments): 2cmx1cm oval shaped wound, covered with grey/brown scabbing which has hair/fuzz? imbedded in it, dry with no drainage  Present on Admission: Yes  Dressing Type Foam - Lift dressing to assess site every shift 04/25/22 0900    DVT prophylaxis:heparin injection 5,000 Units Start: 04/05/22 0600 SCDs Start: 04/05/22 0018     Code Status: DNR  Family Communication: None at bedside  Patient status:Inpatient  Patient is from :Home  Anticipated discharge to:SNF  Estimated DC date:Not sure.  APS involved for guardianship.  Patient does not have capacity.   Consultants: ID, podiatry, neurosurgery  Procedures:None  Antimicrobials:  Anti-infectives (From admission, onward)    Start     Dose/Rate Route Frequency Ordered Stop   04/12/22 1000  amoxicillin-clavulanate (AUGMENTIN) 875-125 MG per tablet 1 tablet        1 tablet  Oral Every 12 hours 04/12/22 0847     04/05/22 1700  Ampicillin-Sulbactam (UNASYN) 3 g in sodium chloride 0.9 % 100 mL IVPB  Status:  Discontinued        3 g 200 mL/hr over 30 Minutes Intravenous Every 6 hours 04/05/22 1643 04/12/22 0847   04/05/22 0800  cefTRIAXone (ROCEPHIN) 1 g in sodium chloride 0.9 % 100 mL IVPB  Status:  Discontinued        1 g 200 mL/hr over 30 Minutes Intravenous Every 24 hours 04/05/22 0017 04/05/22 1643   04/04/22 2145  cefTRIAXone (ROCEPHIN) 1 g in sodium chloride 0.9 % 100 mL IVPB        1 g 200 mL/hr over 30 Minutes Intravenous  Once 04/04/22 2135 04/04/22 2240       Subjective: Patient seen and examined bedside today.  Hemodynamically  stable.  Comfortable.  Her abdomen looks distended today.  Complains of lack of bowel movement.  Constantly asking when she can go home.  Objective: Vitals:   04/24/22 2045 04/25/22 0449 04/25/22 0450 04/25/22 0855  BP: 125/69 107/68 107/68   Pulse: 96 93 93   Resp: 18 19    Temp: 98.4 F (36.9 C) 98.6 F (37 C) 98.7 F (37.1 C)   TempSrc: Oral Oral Oral   SpO2: 98% 98% 98% 98%  Weight:   52 kg   Height:        Intake/Output Summary (Last 24 hours) at 04/25/2022 1130 Last data filed at 04/25/2022 0452 Gross per 24 hour  Intake 580 ml  Output 1100 ml  Net -520 ml   Filed Weights   04/23/22 0500 04/24/22 0500 04/25/22 0450  Weight: 53 kg 53.4 kg 52 kg    Examination:   General exam: Overall comfortable, not in distress, pleasant elderly female, weak, deconditioned HEENT: PERRL Respiratory system:  no wheezes or crackles  Cardiovascular system: S1 & S2 heard, RRR.  Gastrointestinal system: Abdomen is nondistended, soft and nontender. Central nervous system: Alert and oriented Extremities: No edema, no clubbing ,no cyanosis, ulcer on the lateral aspect of the left foot Skin: No rashes, no ulcers,no icterus      Data Reviewed: I have personally reviewed following labs and imaging studies  CBC: Recent  Labs  Lab 04/19/22 0735 04/22/22 0444 04/25/22 0435  WBC 7.7 7.4 10.1  HGB 12.4 12.2 13.1  HCT 40.3 39.5 42.5  MCV 87.6 86.6 88.0  PLT 253 203 465   Basic Metabolic Panel: Recent Labs  Lab 04/19/22 0735 04/20/22 0400 04/22/22 0444 04/25/22 0435  NA 129* 130* 134* 135  K 5.1 4.8 4.0 4.4  CL 92* 91* 96* 97*  CO2 29 30 29 30   GLUCOSE 142* 156* 166* 97  BUN 14 15 13 11   CREATININE 0.59 0.65 0.37* 0.42*  CALCIUM 8.8* 9.0 8.7* 9.2     No results found for this or any previous visit (from the past 240 hour(s)).   Radiology Studies: No results found.  Scheduled Meds:  (feeding supplement) PROSource Plus  30  mL Oral BID BM   amoxicillin-clavulanate  1 tablet Oral Q12H   budesonide (PULMICORT) nebulizer solution  0.25 mg Nebulization BID   heparin  5,000 Units Subcutaneous Q8H   insulin aspart  0-15 Units Subcutaneous TID WC   insulin aspart  0-5 Units Subcutaneous QHS   insulin glargine-yfgn  18 Units Subcutaneous QHS   ipratropium-albuterol  3 mL Nebulization BID   lactulose  20 g Oral Daily   lidocaine  1 patch Transdermal Q24H   metoprolol succinate  12.5 mg Oral Daily   polyethylene glycol  17 g Oral BID   senna-docusate  1 tablet Oral BID   Continuous Infusions:     LOS: 21 days   Shelly Coss, MD [facility] P12/19/2023, 11:30 AM

## 2022-04-25 NOTE — Progress Notes (Signed)
Occupational Therapy Treatment Patient Details Name: [name] MRN: [id number] DOB: 1935-05-25 Today's Date: 04/25/2022   History of present illness The patient is a 86 year old female with history of diabetes mellitus type 2, complete heart block status post pacemaker, chronic hypoxic respiratory failure on home oxygen at 3 L/min, COPD, unspecified CVA, hypertension, hyperlipidemia, thrombocytopenia, chronic wounds including left metatarsal head osteomyelitis. She was found to have a UTI. She was also found to have T12 vertebral fracture.  Surgery felt that she has a T12 osteoporotic compression fracture with associated pain with no retropulsion or canal stenosis.   OT comments  Patient required some encouragement to participate in the session, however her initial reluctance appeared to be at least partially due to her presenting with some confusion/disorientation. She appeared more disoriented today, as she referenced such things as currently being at a hotel and asking this therapist about staying at the hotel. She required mod-max assist for bed mobility. She sat edge of bed, demonstrating fair sitting balance, while performing simple grooming and therapeutic exercises. She declined out of bed activity, reporting back pain and fatigue. She will continue to benefit from further OT services to maximize her ADL performance & to decrease the risk for further weakness and deconditioning.    Recommendations for follow up therapy are one component of a multi-disciplinary discharge planning process, led by the attending physician.  Recommendations may be updated based on patient status, additional functional criteria and insurance authorization.    Follow Up Recommendations  Skilled nursing-short term rehab (<3 hours/day)     Assistance Recommended at Discharge Frequent or constant Supervision/Assistance  Patient can return home with the following  A lot of help with  bathing/dressing/bathroom;Assistance with cooking/housework;Direct supervision/assist for financial management;Assist for transportation;Help with stairs or ramp for entrance;Direct supervision/assist for medications management;A lot of help with walking and/or transfers   Equipment Recommendations  Other (comment) (to be determined pending progress at next setting)       Precautions / Restrictions Precautions Precautions: Fall;Back Restrictions Weight Bearing Restrictions: No       Mobility Bed Mobility Overal bed mobility: Needs Assistance Bed Mobility: Supine to Sit, Sit to Supine Rolling: Min assist   Supine to sit: Mod assist, HOB elevated Sit to supine: Max assist   General bed mobility comments: Pt required general cues for best transfer technique, including using bed rail and advancing BLE    Transfers          General transfer comment: unable to attempt, as the pt declined out of bed activity         ADL either performed or assessed with clinical judgement   ADL Overall ADL's : Needs assistance/impaired Eating/Feeding: Set up;Bed level Eating/Feeding Details (indicate cue type and reason): based on clinical judgement Grooming: Sitting;Set up;Supervision/safety Grooming Details (indicate cue type and reason): She performed hand washing seated EOB.         Upper Body Dressing : Moderate assistance;Sitting   Lower Body Dressing: Maximal assistance                        Cognition Arousal/Alertness: Awake/alert   Overall Cognitive Status: No family/caregiver present to determine baseline cognitive functioning Area of Impairment: Memory, Orientation          Orientation Level: Time, Situation   Memory: Decreased short-term memory Following Commands: Follows one step commands with increased time  Pertinent Vitals/ Pain       Pain Assessment Pain Assessment: Faces Pain Score: 5  Pain Location: back  with activity Pain Intervention(s): Limited activity within patient's tolerance, Repositioned         Frequency  Min 2X/week        Progress Toward Goals  OT Goals(current goals can now be found in the care plan section)     Acute Rehab OT Goals Patient Stated Goal: to return home to her cats OT Goal Formulation: With patient Time For Goal Achievement: 05/07/22 Potential to Achieve Goals: [place] Discharge plan remains appropriate       AM-PAC OT "6 Clicks" Daily Activity     Outcome Measure   Help from another person eating meals?: A Little Help from another person taking care of personal grooming?: A Little Help from another person toileting, which includes using toliet, bedpan, or urinal?: Total Help from another person bathing (including washing, rinsing, drying)?: A Lot Help from another person to put on and taking off regular upper body clothing?: A Lot Help from another person to put on and taking off regular lower body clothing?: A Lot 6 Click Score: 13    End of Session    OT Visit Diagnosis: Muscle weakness (generalized) (M62.81);Other symptoms and signs involving cognitive function;Pain   Activity Tolerance Patient limited by fatigue   Patient Left in bed;with bed alarm set;with call bell/phone within reach   Nurse Communication Mobility status        Time: 5615-3794 OT Time Calculation (min): 26 min  Charges: OT General Charges $OT Visit: 1 Visit OT Treatments $Therapeutic Activity: 23-37 mins     Leota Sauers, OTR/L 04/25/2022, 3:43 PM

## 2022-04-26 ENCOUNTER — Inpatient Hospital Stay (HOSPITAL_COMMUNITY): Payer: Medicare Other

## 2022-04-26 ENCOUNTER — Ambulatory Visit: Payer: Medicare Other | Admitting: Infectious Disease

## 2022-04-26 DIAGNOSIS — Z950 Presence of cardiac pacemaker: Secondary | ICD-10-CM

## 2022-04-26 DIAGNOSIS — A409 Streptococcal sepsis, unspecified: Secondary | ICD-10-CM | POA: Diagnosis not present

## 2022-04-26 DIAGNOSIS — M86672 Other chronic osteomyelitis, left ankle and foot: Secondary | ICD-10-CM | POA: Diagnosis not present

## 2022-04-26 DIAGNOSIS — Z0189 Encounter for other specified special examinations: Secondary | ICD-10-CM | POA: Diagnosis not present

## 2022-04-26 DIAGNOSIS — N1330 Unspecified hydronephrosis: Secondary | ICD-10-CM | POA: Insufficient documentation

## 2022-04-26 DIAGNOSIS — R7881 Bacteremia: Secondary | ICD-10-CM | POA: Diagnosis not present

## 2022-04-26 LAB — GLUCOSE, CAPILLARY
Glucose-Capillary: 86 mg/dL (ref 70–99)
Glucose-Capillary: 156 mg/dL — ABNORMAL HIGH (ref 70–99)
Glucose-Capillary: 130 mg/dL — ABNORMAL HIGH (ref 70–99)
Glucose-Capillary: 148 mg/dL — ABNORMAL HIGH (ref 70–99)

## 2022-04-26 NOTE — Consult Note (Signed)
   [facility] Inpatient Consult   04/26/2022  [name] [birth year] [id number]  [place] Organization [ACO] Patient:  Medicare [facility] Liaison remote coverage review for patient admitted to [name]   Primary Care Provider:  Wardell Honour, MD with [facility]   Patient has been followed for hospitalization with noted extreme risk score for unplanned readmission risk and length of stay to assess for potential [place] Management service needs for post hospital transition for care coordination.  Review of patient's electronic medical record of inpatient [facility] team notes which reveals patient is being recommended for guardianship pending court date for a skilled nursing facility level of care.   Plan:  Continue to follow progress and disposition to assess for post hospital community care coordination/management needs.  Referral request for community care coordination: pending disposition.  Of note, [facility] Care Management/Population Health does not replace or interfere with any arrangements made by the Inpatient Transition of Care team.  For questions contact:   Natividad Brood, RN BSN [place]  [phone] business mobile phone Toll free office [phone]  *[place]  [phone] Fax number: [phone] [email or web address]

## 2022-04-26 NOTE — Progress Notes (Signed)
PROGRESS NOTE  [name] XLK:[id number] DOB: [birth year]   PCP: Wardell Honour, MD  Patient is from: Home.  DOA: 04/04/2022 LOS: 48  Chief complaints Chief Complaint  Patient presents with   Weakness     Brief Narrative / Interim history: 86 year old female with history of chronic left foot osteomyelitis on antibiotics, DM-2, CVA, BPPV, arthritis, COPD, HTN, glaucoma , HLD, IBS, spinal stenosis, CHB s/p PPM, chronic hypoxia on 3L home O2 who initially presented with altered mental status. On presentation ,she was hypoglycemic and suspected to have UTI.  CT abdomen and pelvis with severe urinary bladder distention and moderate bilateral hydroureteronephrosis and acute superior endplate compression deformity at T12 with about 40% height loss but no retropulsion.  Started on antibiotics.  Blood cultures with Enterococcus faecalis and Rothia Mucilaginosa in 1 out of 4 bottles.   Repeat blood cultures have been negative.no report of vegetation on TTE.  On 04/10/2022, ID recommended p.o. Augmentin for additional 3 weeks with a plan to see outpatient on 04/26/2022.  ID did not recommend TEE.   Therapy recommended SNF but patient wants to go home.  However, patient did not demonstrate capacity to make medical decision.  Psychiatry consulted and deemed to lack capacity.  APS in process to get the guardianship paperwork. TOC following   Subjective: Seen and examined earlier this morning.  Patient reports severe pain early in the morning that has resolved.  Pain is on and off.  She denies nausea, vomiting, diarrhea, constipation or UTI symptoms.  However, patient seems to have cognitive impairment.  She is awake and alert but only oriented to self and "hospital".  Objective: Vitals:   04/25/22 2300 04/26/22 0500 04/26/22 0500 04/26/22 1338  BP:   129/76 137/73  Pulse:   89 93  Resp: 18  19 16   Temp:   99 F (37.2 C) 98.8 F (37.1 C)  TempSrc:   Oral Oral  SpO2:   99% 98%  Weight:  49  kg    Height:        Examination:  GENERAL: No apparent distress.  Nontoxic. HEENT: MMM.  Vision and hearing grossly intact.  NECK: Supple.  No apparent JVD.  RESP:  No IWOB.  Fair aeration bilaterally. CVS:  RRR. Heart sounds normal.  ABD/GI/GU: BS+. Abd soft.  Palpable abdominal wall mass on the left. MSK/EXT:  Moves extremities. No apparent deformity. No edema.  SKIN: no apparent skin lesion or wound NEURO: Awake and alert.  Oriented to self and "hospital".  No apparent focal neuro deficit. PSYCH: Calm. Normal affect.   Procedures:  None  Microbiology summarized: 11/28-blood culture with Enterococcus faecalis and Rothia mucilaginosa  Assessment and plan: Principal Problem:   Bacteremia Active Problems:   Lacks decision making capacity   Chronic osteomyelitis of left foot ([place])   Uncontrolled type 2 diabetes mellitus with hyperglycemia (HCC)   Chronic respiratory failure with hypoxia (HCC)   Pressure injury of skin   Pacemaker   Protein-calorie malnutrition, severe   T12 vertebral fracture (HCC)   Constipation   Hydroureteronephrosis  Polymicrobial bacteremia: Blood culture with Enterococcus faecalis and Rothia mucilaginosa in 1 out of 4 bottles.  No mention of vegetation on TTE.  ID did not think TEE is indicated.  -Per ID note on 12/4, p.o. Augmentin for 3 additional weeks until outpatient follow-up on 12/20.  -Will discuss with ID about further antibiotic plan since patient remains in house.  Chronic left foot osteomyelitis: Followed by ID outpatient.  On antibiotics chronically.  Continues to refuse surgery call intervention.  However, patient has no capacit -Continue Augmentin as above   -Continue Betadine wet-to-dry dressing twice daily.   Sepsis without acute organ dysfunction: Resolved.  Initially presented with leukocytosis, tachycardia.   Acute cystitis without hematuria: Resolved.  Completed course of antibiotic for UTI.  Urine cultures are negative.    Controlled DM-2 with hyperglycemia: A1c of 12.9.  Likely from noncompliance, lack of capacity.   Recent Labs  Lab 04/25/22 1117 04/25/22 1624 04/25/22 2034 04/26/22 0747 04/26/22 1202  GLUCAP 224* 192* 198* 86 156*  -Continue current insulin regimen.    Constipation: Abdomen appears soft but she seems to have some abdominal wall mass on the left side. -Continue bowel regimen.    T2 vertebral fracture: Unable to perform MRI due to presence of pacemaker.  Patient evaluated  by neurosurgery.  Compression fraction felt to be related to osteoporosis, no associated pain.  Recommended brace for comfort with ambulation.  No surgical intervention recommended.  Continue to work with PT/OT  Bilateral hydroureteronephrosis: Noted on CT abdomen and pelvis on admission.  Since she passed voiding trial on 12/5.  -Repeat renal ultrasound  Possible abdominal mass: She has palpable abdominal mass on the left.  No overlying skin color change.  No significant tenderness. -Will see what shows on renal ultrasound.   History of complete heart block s/p PPM.  HR 90s to 100. -Continue low-dose metoprolol   Chronic respiratory failure with hypoxia: On 3 L of oxygen chronically.   Debility/deconditioning/disposition: Patient from home.  PT/OT recommending SNF.  She declines to go to SNF.  Does not have capacity.  DDS/APS involved.TOC following  Hypokalemia/hypomagnesemia: Resolved  Severe protein calorie malnutrition Body mass index is 19.14 kg/m. Nutrition Problem: Severe Malnutrition Etiology: chronic illness Signs/Symptoms: severe fat depletion, severe muscle depletion, percent weight loss Percent weight loss: 31 % (in 1 year) Interventions: Refer to RD note for recommendations, Premier Protein, Glucerna shake  Pressure skin injury: POA Pressure Injury 04/05/22 Hip Anterior;Left;Proximal Stage 2 -  Partial thickness loss of dermis presenting as a shallow open injury with a red, pink wound bed  without slough. Round 1cmx1cm pressure sore, yellow center with pink edges, dry wound bed (Active)  04/05/22 0000  Location: Hip  Location Orientation: Anterior;Left;Proximal  Staging: Stage 2 -  Partial thickness loss of dermis presenting as a shallow open injury with a red, pink wound bed without slough.  Wound Description (Comments): Round 1cmx1cm pressure sore, yellow center with pink edges, dry wound bed  Present on Admission: Yes  Dressing Type Foam - Lift dressing to assess site every shift 04/26/22 0854     Pressure Injury 04/05/22 Toe (Comment  which one) Left;Lateral Unstageable - Full thickness tissue loss in which the base of the injury is covered by slough (yellow, tan, gray, green or brown) and/or eschar (tan, brown or black) in the wound bed. 2cmx1cm (Active)  04/05/22 0000  Location: Toe (Comment  which one) (outer aspect of pinky toe)  Location Orientation: Left;Lateral  Staging: Unstageable - Full thickness tissue loss in which the base of the injury is covered by slough (yellow, tan, gray, green or brown) and/or eschar (tan, brown or black) in the wound bed.  Wound Description (Comments): 2cmx1cm oval shaped wound, covered with grey/brown scabbing which has hair/fuzz? imbedded in it, dry with no drainage  Present on Admission: Yes  Dressing Type Foam - Lift dressing to assess site every shift 04/26/22 0854  DVT prophylaxis:  heparin injection 5,000 Units Start: 04/05/22 0600 SCDs Start: 04/05/22 0018  Code Status: DNR/DNI Family Communication: None at bedside.  Patient has no family. Level of care: Telemetry Status is: Inpatient Remains inpatient appropriate because: Safe disposition and lack of capacity   Final disposition: SNF Consultants:  Infectious disease Psychiatry  Sch Meds:  Scheduled Meds:  (feeding supplement) PROSource Plus  30 mL Oral BID BM   amoxicillin-clavulanate  1 tablet Oral Q12H   budesonide (PULMICORT) nebulizer solution  0.25 mg  Nebulization BID   heparin  5,000 Units Subcutaneous Q8H   insulin aspart  0-15 Units Subcutaneous TID WC   insulin aspart  0-5 Units Subcutaneous QHS   insulin glargine-yfgn  18 Units Subcutaneous QHS   ipratropium-albuterol  3 mL Nebulization BID   lactulose  20 g Oral Daily   lidocaine  1 patch Transdermal Q24H   metoprolol succinate  12.5 mg Oral Daily   polyethylene glycol  17 g Oral BID   senna-docusate  1 tablet Oral BID   Continuous Infusions: PRN Meds:.acetaminophen, acetaminophen, albuterol, bisacodyl, meclizine, ondansetron (ZOFRAN) IV  Antimicrobials: Anti-infectives (From admission, onward)    Start     Dose/Rate Route Frequency Ordered Stop   04/12/22 1000  amoxicillin-clavulanate (AUGMENTIN) 875-125 MG per tablet 1 tablet        1 tablet Oral Every 12 hours 04/12/22 0847     04/05/22 1700  Ampicillin-Sulbactam (UNASYN) 3 g in sodium chloride 0.9 % 100 mL IVPB  Status:  Discontinued        3 g 200 mL/hr over 30 Minutes Intravenous Every 6 hours 04/05/22 1643 04/12/22 0847   04/05/22 0800  cefTRIAXone (ROCEPHIN) 1 g in sodium chloride 0.9 % 100 mL IVPB  Status:  Discontinued        1 g 200 mL/hr over 30 Minutes Intravenous Every 24 hours 04/05/22 0017 04/05/22 1643   04/04/22 2145  cefTRIAXone (ROCEPHIN) 1 g in sodium chloride 0.9 % 100 mL IVPB        1 g 200 mL/hr over 30 Minutes Intravenous  Once 04/04/22 2135 04/04/22 2240        I have personally reviewed the following labs and images: CBC: Recent Labs  Lab 04/22/22 0444 04/25/22 0435  WBC 7.4 10.1  HGB 12.2 13.1  HCT 39.5 42.5  MCV 86.6 88.0  PLT 203 194   BMP &GFR Recent Labs  Lab 04/20/22 0400 04/22/22 0444 04/25/22 0435  NA 130* 134* 135  K 4.8 4.0 4.4  CL 91* 96* 97*  CO2 30 29 30   GLUCOSE 156* 166* 97  BUN 15 13 11   CREATININE 0.65 0.37* 0.42*  CALCIUM 9.0 8.7* 9.2   Estimated Creatinine Clearance: 40.5 mL/min (A) (by C-G formula based on SCr of 0.42 mg/dL (L)). Liver &  Pancreas: No results for input(s): "AST", "ALT", "ALKPHOS", "BILITOT", "PROT", "ALBUMIN" in the last 168 hours. No results for input(s): "LIPASE", "AMYLASE" in the last 168 hours. No results for input(s): "AMMONIA" in the last 168 hours. Diabetic: No results for input(s): "HGBA1C" in the last 72 hours. Recent Labs  Lab 04/25/22 1117 04/25/22 1624 04/25/22 2034 04/26/22 0747 04/26/22 1202  GLUCAP 224* 192* 198* 86 156*   Cardiac Enzymes: No results for input(s): "CKTOTAL", "CKMB", "CKMBINDEX", "TROPONINI" in the last 168 hours. No results for input(s): "PROBNP" in the last 8760 hours. Coagulation Profile: No results for input(s): "INR", "PROTIME" in the last 168 hours. Thyroid Function Tests: No results for input(s): "  TSH", "T4TOTAL", "FREET4", "T3FREE", "THYROIDAB" in the last 72 hours. Lipid Profile: No results for input(s): "CHOL", "HDL", "LDLCALC", "TRIG", "CHOLHDL", "LDLDIRECT" in the last 72 hours. Anemia Panel: No results for input(s): "VITAMINB12", "FOLATE", "FERRITIN", "TIBC", "IRON", "RETICCTPCT" in the last 72 hours. Urine analysis:    Component Value Date/Time   COLORURINE YELLOW 04/21/2022 1140   APPEARANCEUR TURBID (A) 04/21/2022 1140   LABSPEC 1.010 04/21/2022 1140   PHURINE 5.0 04/21/2022 1140   GLUCOSEU 50 (A) 04/21/2022 1140   GLUCOSEU 100 (A) 05/11/2017 1015   HGBUR MODERATE (A) 04/21/2022 1140   BILIRUBINUR NEGATIVE 04/21/2022 1140   BILIRUBINUR Negative 11/25/2021 1415   KETONESUR NEGATIVE 04/21/2022 1140   PROTEINUR 100 (A) 04/21/2022 1140   UROBILINOGEN 0.2 11/25/2021 1415   UROBILINOGEN 0.2 05/11/2017 1015   NITRITE NEGATIVE 04/21/2022 1140   LEUKOCYTESUR LARGE (A) 04/21/2022 1140   Sepsis Labs: Invalid input(s): "PROCALCITONIN", "LACTICIDVEN"  Microbiology: No results found for this or any previous visit (from the past 240 hour(s)).  Radiology Studies: No results found.    Madelyn Tlatelpa T[place]  If 7PM-7AM, please contact  night-coverage [email or web address] 04/26/2022, 3:48 PM

## 2022-04-27 DIAGNOSIS — E440 Moderate protein-calorie malnutrition: Secondary | ICD-10-CM | POA: Diagnosis not present

## 2022-04-27 DIAGNOSIS — A409 Streptococcal sepsis, unspecified: Secondary | ICD-10-CM | POA: Diagnosis not present

## 2022-04-27 DIAGNOSIS — G9341 Metabolic encephalopathy: Secondary | ICD-10-CM | POA: Diagnosis present

## 2022-04-27 DIAGNOSIS — Z801 Family history of malignant neoplasm of trachea, bronchus and lung: Secondary | ICD-10-CM | POA: Diagnosis not present

## 2022-04-27 DIAGNOSIS — K5900 Constipation, unspecified: Secondary | ICD-10-CM | POA: Diagnosis not present

## 2022-04-27 DIAGNOSIS — D72829 Elevated white blood cell count, unspecified: Secondary | ICD-10-CM | POA: Diagnosis not present

## 2022-04-27 DIAGNOSIS — S22080D Wedge compression fracture of T11-T12 vertebra, subsequent encounter for fracture with routine healing: Secondary | ICD-10-CM | POA: Diagnosis not present

## 2022-04-27 DIAGNOSIS — Z7189 Other specified counseling: Secondary | ICD-10-CM | POA: Diagnosis not present

## 2022-04-27 DIAGNOSIS — N939 Abnormal uterine and vaginal bleeding, unspecified: Secondary | ICD-10-CM | POA: Diagnosis not present

## 2022-04-27 DIAGNOSIS — R339 Retention of urine, unspecified: Secondary | ICD-10-CM | POA: Diagnosis not present

## 2022-04-27 DIAGNOSIS — Z823 Family history of stroke: Secondary | ICD-10-CM | POA: Diagnosis not present

## 2022-04-27 DIAGNOSIS — F4489 Other dissociative and conversion disorders: Secondary | ICD-10-CM | POA: Diagnosis not present

## 2022-04-27 DIAGNOSIS — Z794 Long term (current) use of insulin: Secondary | ICD-10-CM | POA: Diagnosis not present

## 2022-04-27 DIAGNOSIS — L89202 Pressure ulcer of unspecified hip, stage 2: Secondary | ICD-10-CM | POA: Diagnosis not present

## 2022-04-27 DIAGNOSIS — Z8249 Family history of ischemic heart disease and other diseases of the circulatory system: Secondary | ICD-10-CM | POA: Diagnosis not present

## 2022-04-27 DIAGNOSIS — E1169 Type 2 diabetes mellitus with other specified complication: Secondary | ICD-10-CM | POA: Diagnosis not present

## 2022-04-27 DIAGNOSIS — Z515 Encounter for palliative care: Secondary | ICD-10-CM | POA: Diagnosis not present

## 2022-04-27 DIAGNOSIS — E11649 Type 2 diabetes mellitus with hypoglycemia without coma: Secondary | ICD-10-CM | POA: Diagnosis not present

## 2022-04-27 DIAGNOSIS — R7881 Bacteremia: Secondary | ICD-10-CM | POA: Diagnosis not present

## 2022-04-27 DIAGNOSIS — S22089A Unspecified fracture of T11-T12 vertebra, initial encounter for closed fracture: Secondary | ICD-10-CM | POA: Diagnosis not present

## 2022-04-27 DIAGNOSIS — Z800 Family history of malignant neoplasm of digestive organs: Secondary | ICD-10-CM | POA: Diagnosis not present

## 2022-04-27 DIAGNOSIS — Z8639 Personal history of other endocrine, nutritional and metabolic disease: Secondary | ICD-10-CM | POA: Diagnosis not present

## 2022-04-27 DIAGNOSIS — Z681 Body mass index (BMI) 19 or less, adult: Secondary | ICD-10-CM | POA: Diagnosis not present

## 2022-04-27 DIAGNOSIS — I482 Chronic atrial fibrillation, unspecified: Secondary | ICD-10-CM | POA: Diagnosis not present

## 2022-04-27 DIAGNOSIS — Z66 Do not resuscitate: Secondary | ICD-10-CM | POA: Diagnosis not present

## 2022-04-27 DIAGNOSIS — E1142 Type 2 diabetes mellitus with diabetic polyneuropathy: Secondary | ICD-10-CM | POA: Diagnosis not present

## 2022-04-27 DIAGNOSIS — K5909 Other constipation: Secondary | ICD-10-CM | POA: Diagnosis not present

## 2022-04-27 DIAGNOSIS — E785 Hyperlipidemia, unspecified: Secondary | ICD-10-CM | POA: Diagnosis not present

## 2022-04-27 DIAGNOSIS — Z9981 Dependence on supplemental oxygen: Secondary | ICD-10-CM | POA: Diagnosis not present

## 2022-04-27 DIAGNOSIS — J9611 Chronic respiratory failure with hypoxia: Secondary | ICD-10-CM | POA: Diagnosis present

## 2022-04-27 DIAGNOSIS — K219 Gastro-esophageal reflux disease without esophagitis: Secondary | ICD-10-CM | POA: Diagnosis not present

## 2022-04-27 DIAGNOSIS — M869 Osteomyelitis, unspecified: Secondary | ICD-10-CM | POA: Diagnosis not present

## 2022-04-27 DIAGNOSIS — Z7901 Long term (current) use of anticoagulants: Secondary | ICD-10-CM | POA: Diagnosis not present

## 2022-04-27 DIAGNOSIS — Z833 Family history of diabetes mellitus: Secondary | ICD-10-CM | POA: Diagnosis not present

## 2022-04-27 DIAGNOSIS — E1149 Type 2 diabetes mellitus with other diabetic neurological complication: Secondary | ICD-10-CM | POA: Diagnosis not present

## 2022-04-27 DIAGNOSIS — R58 Hemorrhage, not elsewhere classified: Secondary | ICD-10-CM | POA: Diagnosis not present

## 2022-04-27 DIAGNOSIS — I10 Essential (primary) hypertension: Secondary | ICD-10-CM | POA: Diagnosis present

## 2022-04-27 DIAGNOSIS — L89222 Pressure ulcer of left hip, stage 2: Secondary | ICD-10-CM | POA: Diagnosis not present

## 2022-04-27 DIAGNOSIS — Z7401 Bed confinement status: Secondary | ICD-10-CM | POA: Diagnosis not present

## 2022-04-27 DIAGNOSIS — K7581 Nonalcoholic steatohepatitis (NASH): Secondary | ICD-10-CM | POA: Diagnosis not present

## 2022-04-27 DIAGNOSIS — Z950 Presence of cardiac pacemaker: Secondary | ICD-10-CM | POA: Diagnosis not present

## 2022-04-27 DIAGNOSIS — R338 Other retention of urine: Secondary | ICD-10-CM | POA: Diagnosis not present

## 2022-04-27 DIAGNOSIS — D62 Acute posthemorrhagic anemia: Secondary | ICD-10-CM | POA: Diagnosis present

## 2022-04-27 DIAGNOSIS — E43 Unspecified severe protein-calorie malnutrition: Secondary | ICD-10-CM | POA: Diagnosis not present

## 2022-04-27 DIAGNOSIS — Z86718 Personal history of other venous thrombosis and embolism: Secondary | ICD-10-CM | POA: Diagnosis not present

## 2022-04-27 DIAGNOSIS — N390 Urinary tract infection, site not specified: Secondary | ICD-10-CM | POA: Diagnosis not present

## 2022-04-27 DIAGNOSIS — N1330 Unspecified hydronephrosis: Secondary | ICD-10-CM | POA: Diagnosis not present

## 2022-04-27 DIAGNOSIS — J449 Chronic obstructive pulmonary disease, unspecified: Secondary | ICD-10-CM | POA: Diagnosis present

## 2022-04-27 DIAGNOSIS — J9610 Chronic respiratory failure, unspecified whether with hypoxia or hypercapnia: Secondary | ICD-10-CM | POA: Diagnosis not present

## 2022-04-27 DIAGNOSIS — I442 Atrioventricular block, complete: Secondary | ICD-10-CM | POA: Diagnosis not present

## 2022-04-27 DIAGNOSIS — R319 Hematuria, unspecified: Secondary | ICD-10-CM | POA: Diagnosis not present

## 2022-04-27 DIAGNOSIS — N136 Pyonephrosis: Secondary | ICD-10-CM | POA: Diagnosis not present

## 2022-04-27 DIAGNOSIS — M86672 Other chronic osteomyelitis, left ankle and foot: Secondary | ICD-10-CM | POA: Diagnosis present

## 2022-04-27 DIAGNOSIS — A419 Sepsis, unspecified organism: Secondary | ICD-10-CM | POA: Diagnosis not present

## 2022-04-27 DIAGNOSIS — E871 Hypo-osmolality and hyponatremia: Secondary | ICD-10-CM | POA: Diagnosis not present

## 2022-04-27 DIAGNOSIS — N3000 Acute cystitis without hematuria: Secondary | ICD-10-CM | POA: Diagnosis not present

## 2022-04-27 DIAGNOSIS — L89890 Pressure ulcer of other site, unstageable: Secondary | ICD-10-CM | POA: Diagnosis not present

## 2022-04-27 DIAGNOSIS — R310 Gross hematuria: Secondary | ICD-10-CM | POA: Diagnosis present

## 2022-04-27 DIAGNOSIS — E1165 Type 2 diabetes mellitus with hyperglycemia: Secondary | ICD-10-CM | POA: Diagnosis present

## 2022-04-27 LAB — GLUCOSE, CAPILLARY
Glucose-Capillary: 88 mg/dL (ref 70–99)
Glucose-Capillary: 118 mg/dL — ABNORMAL HIGH (ref 70–99)

## 2022-04-27 MED ORDER — INSULIN GLARGINE-YFGN 100 UNIT/ML ~~LOC~~ SOLN: 18.0000 [IU] | Freq: Every day | SUBCUTANEOUS | 11 refills | Status: DC

## 2022-04-27 MED ORDER — LACTULOSE 10 GM/15ML PO SOLN: 20.0000 g | Freq: Every day | ORAL | 0 refills | Status: AC

## 2022-04-27 MED ORDER — METOPROLOL SUCCINATE ER 25 MG PO TB24: 25.0000 mg | ORAL_TABLET | Freq: Every day | ORAL | Status: AC

## 2022-04-27 MED ORDER — INSULIN ASPART 100 UNIT/ML IJ SOLN: 0.0000 [IU] | Freq: Three times a day (TID) | INTRAMUSCULAR | 11 refills | Status: AC

## 2022-04-27 MED ORDER — AMOXICILLIN-POT CLAVULANATE 875-125 MG PO TABS: 1.0000 | ORAL_TABLET | Freq: Two times a day (BID) | ORAL | 0 refills | Status: AC

## 2022-04-27 MED ORDER — BISACODYL 10 MG RE SUPP: 10.0000 mg | Freq: Every day | RECTAL | 0 refills | Status: AC | PRN

## 2022-04-27 NOTE — TOC Progression Note (Addendum)
Transition of Care [facility]) - Progression Note    Patient Details  Name: [name] MRN: [id number] Date of Birth: 1935/10/07  [facility] CM/SW Contact  Leeroy Cha, RN Phone Number: 04/27/2022, 11:34 AM  Clinical Narrative:    [facility] no beds currently but may be able to take a long term bed if one opens up. Tct-Kristal at [facility] a long term bed open today. Tct-krystal-bed secured wcb with room number.    Barriers to Discharge: Continued Medical Work up  Expected Discharge Plan and Services   Discharge Planning Services: CM Consult Post Acute Care Choice: [place] Living arrangements for the past 2 months: Apartment                                       Social Determinants of Health (SDOH) Interventions SDOH Screenings   Food Insecurity: No Food Insecurity (01/16/2022)  Housing: Low Risk  (01/16/2022)  Transportation Needs: No Transportation Needs (01/16/2022)  Recent Concern: Transportation Needs - Unmet Transportation Needs (10/24/2021)  Utilities: Not At Risk (01/16/2022)  Depression (PHQ2-9): Low Risk  (10/24/2021)  Financial Resource Strain: Low Risk  (10/24/2021)  Social Connections: Moderately Isolated (02/17/2020)  Stress: No Stress Concern Present (04/28/2020)  Tobacco Use: Medium Risk (04/05/2022)    Readmission Risk Interventions   No data to display

## 2022-04-27 NOTE — Discharge Summary (Signed)
Physician Discharge Summary  [name] OIB:[id number] DOB: 02/17/1936 DOA: 04/04/2022  PCP: Wardell Honour, MD  Admit date: 04/04/2022 Discharge date: 04/27/2022 Admitted From: Home. Disposition: SNF Recommendations for Outpatient Follow-up:   Check CMP and CBC in 1 week Recommend outpatient follow-up with urology in 2 to 3 weeks for bilateral hydronephrosis Please follow up on the following pending results: None   Discharge Condition: Stable CODE STATUS: DNR/DNI.   Hospital course 86 year old female with history of chronic left foot osteomyelitis on antibiotics, DM-2, CVA, chronic DVT on Eliquis, BPPV, arthritis, COPD, HTN, glaucoma , HLD, IBS, spinal stenosis, CHB s/p PPM, chronic hypoxia on 3L home O2 who initially presented with altered mental status. On presentation ,she was hypoglycemic and suspected to have UTI.  CT abdomen and pelvis with severe urinary bladder distention and moderate bilateral hydroureteronephrosis and acute superior endplate compression deformity at T12 with about 40% height loss but no retropulsion.  Started on antibiotics.  Blood cultures with Enterococcus faecalis and Rothia Mucilaginosa in 1 out of 4 bottles.   Repeat blood cultures have been negative. No report of vegetation on TTE.  ID did not recommend TEE. On 04/10/2022, ID recommended p.o. Augmentin for additional 3 weeks with a plan to see outpatient on 04/26/2022.  However, patient remained in the hospital until 04/27/2022.  ID recommended extending antibiotic course until her outpatient follow-up which is rescheduled for 05/24/2022.  Per ID, she may need amputation if no improvement.   Therapy recommended SNF but patient wants to go home.  However, patient did not demonstrate capacity to make medical decision.  Psychiatry consulted and deemed to lack capacity.  APS assumed guardianship.    See individual problem list below for more.   Problems addressed during this  hospitalization Principal Problem:   Bacteremia Active Problems:   Lacks decision making capacity   Chronic osteomyelitis of left foot ([place])   Uncontrolled type 2 diabetes mellitus with hyperglycemia (HCC)   Chronic respiratory failure with hypoxia (HCC)   Pressure injury of skin   Pacemaker   Protein-calorie malnutrition, severe   T12 vertebral fracture (HCC)   Constipation   Hydroureteronephrosis   Polymicrobial bacteremia: Blood culture with Enterococcus faecalis and Rothia mucilaginosa in 1 out of 4 bottles.  No mention of vegetation on TTE.  ID did not think TEE is indicated.  -Continue p.o. Augmentin until outpatient follow-up with ID on 05/24/2022   Chronic left foot osteomyelitis: Followed by ID outpatient.  On antibiotics chronically.  Continues to refuse surgery call intervention.  However, patient has no capacit -Continue Augmentin as above   -Continue Betadine wet-to-dry dressing twice daily.   Sepsis without acute organ dysfunction: Resolved.  Initially presented with leukocytosis, tachycardia.   Acute cystitis without hematuria: Resolved.  Completed course of antibiotic for UTI.  Urine cultures are negative.   Controlled DM-2 with hyperglycemia: A1c of 12.9.  Likely from noncompliance, lack of capacity.   -Continue SSI-moderate -Continue basal insulin 18 units daily   Constipation: Abdomen appears soft but she seems to have some abdominal wall mass on the left side. -Continue bowel regimen.    T2 vertebral fracture: Unable to perform MRI due to presence of pacemaker.  Patient evaluated  by neurosurgery.  Compression fraction felt to be related to osteoporosis, no associated pain.  Recommended brace for comfort with ambulation.  No surgical intervention recommended.  Continue to work with PT/OT   Bilateral hydroureteronephrosis: Noted on CT abdomen and pelvis on admission.  Since she passed  voiding trial on 12/5.  Renal US on 12/20 with stable bilateral hydronephrosis.   Renal function is stable. -Outpatient follow-up with urology in 2 to 3 weeks.   History of complete heart block s/p PPM.  HR 90s to 100. -Continue low-dose metoprolol at reduced dose.   Chronic respiratory failure with hypoxia: On 3 L of oxygen chronically.   Debility/deconditioning/disposition: Patient from home.  PT/OT recommending SNF.  She declines to go to SNF.  Does not have capacity.  DDS/APS involved.TOC following   Hypokalemia/hypomagnesemia: Resolved  Severe malnutrition: Body mass index is 19.14 kg/m.  Nutrition Problem: Severe Malnutrition Etiology: chronic illness Signs/Symptoms: severe fat depletion, severe muscle depletion, percent weight loss Percent weight loss: 31 % (in 1 year) Interventions: Refer to RD note for recommendations, Premier Protein, Glucerna shake   Pressure skin injury: Pressure Injury 04/05/22 Hip Anterior;Left;Proximal Stage 2 -  Partial thickness loss of dermis presenting as a shallow open injury with a red, pink wound bed without slough. Round 1cmx1cm pressure sore, yellow center with pink edges, dry wound bed (Active)  04/05/22 0000  Location: Hip  Location Orientation: Anterior;Left;Proximal  Staging: Stage 2 -  Partial thickness loss of dermis presenting as a shallow open injury with a red, pink wound bed without slough.  Wound Description (Comments): Round 1cmx1cm pressure sore, yellow center with pink edges, dry wound bed  Present on Admission: Yes  Dressing Type Foam - Lift dressing to assess site every shift 04/27/22 0836     Pressure Injury 04/05/22 Toe (Comment  which one) Left;Lateral Unstageable - Full thickness tissue loss in which the base of the injury is covered by slough (yellow, tan, gray, green or brown) and/or eschar (tan, brown or black) in the wound bed. 2cmx1cm (Active)  04/05/22 0000  Location: Toe (Comment  which one) (outer aspect of pinky toe)  Location Orientation: Left;Lateral  Staging: Unstageable - Full thickness  tissue loss in which the base of the injury is covered by slough (yellow, tan, gray, green or brown) and/or eschar (tan, brown or black) in the wound bed.  Wound Description (Comments): 2cmx1cm oval shaped wound, covered with grey/brown scabbing which has hair/fuzz? imbedded in it, dry with no drainage  Present on Admission: Yes  Dressing Type Foam - Lift dressing to assess site every shift 04/27/22 0836    Vital signs Vitals:   04/27/22 0445 04/27/22 0902 04/27/22 0906 04/27/22 1125  BP: 124/63   109/68  Pulse: 100   (!) 105  Temp: 98.7 F (37.1 C)   98.2 F (36.8 C)  Resp: 19   19  Height:      Weight: 49 kg     SpO2: 98% 94% 94% 92%  TempSrc: Oral   Oral  BMI (Calculated): 19.14        Discharge exam  GENERAL: No apparent distress.  Nontoxic. HEENT: MMM.  Vision and hearing grossly intact.  NECK: Supple.  No apparent JVD.  RESP:  No IWOB.  Fair aeration bilaterally. CVS:  RRR. Heart sounds normal.  ABD/GI/GU: BS+. Abd soft, NTND.  MSK/EXT:  Moves extremities.  Ulcer over left lateral foot. SKIN: Sacral decubitus ulcer as above. NEURO: Awake and alert. Oriented to self and "hospital".  Follows commands.  No apparent focal neuro deficit. PSYCH: Calm. Normal affect.   Discharge Instructions Discharge Instructions     Diet - low sodium heart healthy   Complete by: As directed    Diet Carb Modified   Complete by: As directed  Discharge wound care:   Complete by: As directed    04/12/22 1331    Wound care  Every shift      Comments: Left foot wound care: betadine wet to dry dressing. Change every shift  Foam dressing  Every 3 days     Comments: Silicone foam dressings to the coccyx/sacral area, change every 3 days. Assess under dressings each shift for any acute changes in the wounds.  Wound care  Daily      Comments:  1. Cleanse hip wound with saline, pat dry 2. Apply Medihoney to wound bed, top with saline moist 2x2 gauzse (not soaked, just damp) 3. Cover with  foam dressing Ok to peel back foam daily to change gauze and reapply Medihoney   Increase activity slowly   Complete by: As directed       Allergies as of 04/27/2022       Reactions   Chlordiazepoxide-clidinium Other (See Comments)   Sleepy,weak   Biaxin [clarithromycin] Other (See Comments)   Foul taste, abd pain, diarrhea   Tape Other (See Comments)   SKIN IS VERY THIN AND WILL TEAR AND BRUISE EASILY!!   Ultram [tramadol] Other (See Comments)   Caused tremors   Codeine Other (See Comments)   Upset the stomach        Medication List     STOP taking these medications    ALPRAZolam 0.5 MG tablet Commonly known as: XANAX   cefadroxil 500 MG capsule Commonly known as: DURICEF   doxycycline 100 MG tablet Commonly known as: VIBRA-TABS   furosemide 20 MG tablet Commonly known as: LASIX   NovoLOG FlexPen 100 UNIT/ML FlexPen Generic drug: insulin aspart Replaced by: insulin aspart 100 UNIT/ML injection   Tresiba FlexTouch 200 UNIT/ML FlexTouch Pen Generic drug: insulin degludec       TAKE these medications    acetaminophen 325 MG tablet Commonly known as: TYLENOL Take 2 tablets (650 mg total) by mouth every 6 (six) hours as needed for mild pain (or Fever >/= 101).   albuterol 108 (90 Base) MCG/ACT inhaler Commonly known as: VENTOLIN HFA Inhale 1-2 puffs into the lungs every 6 (six) hours as needed for wheezing or shortness of breath.   amoxicillin-clavulanate 875-125 MG tablet Commonly known as: AUGMENTIN Take 1 tablet by mouth every 12 (twelve) hours.   apixaban 5 MG Tabs tablet Commonly known as: ELIQUIS Take 1 tablet (5 mg total) by mouth 2 (two) times daily.   bisacodyl 10 MG suppository Commonly known as: DULCOLAX Place 1 suppository (10 mg total) rectally daily as needed for moderate constipation.   feeding supplement Liqd Take 237 mLs by mouth 2 (two) times daily between meals.   fluorometholone 0.1 % ophthalmic suspension Commonly known as:  FML Place 1 drop into both eyes 3 (three) times daily.   insulin aspart 100 UNIT/ML injection Commonly known as: novoLOG Inject 0-15 Units into the skin 3 (three) times daily with meals. CBG < 70: Implement Hypoglycemia Standing Orders and refer to Hypoglycemia Standing Orders sidebar report CBG 70 - 120: 0 units CBG 121 - 150: 2 units CBG 151 - 200: 3 units CBG 201 - 250: 5 units CBG 251 - 300: 8 units CBG 301 - 350: 11 units CBG 351 - 400: 15 units CBG > 400: call MD and obtain STAT lab verification Replaces: NovoLOG FlexPen 100 UNIT/ML FlexPen   insulin glargine-yfgn 100 UNIT/ML injection Commonly known as: SEMGLEE Inject 0.18 mLs (18 Units total) into the skin daily.  ipratropium-albuterol 0.5-2.5 (3) MG/3ML Soln Commonly known as: DUONEB Use 1 vial in nebulizer every 8 hours and as needed   lactulose 10 GM/15ML solution Commonly known as: CHRONULAC Take 30 mLs (20 g total) by mouth daily. Start taking on: April 28, 2022   levalbuterol 45 MCG/ACT inhaler Commonly known as: XOPENEX HFA Inhale 1-2 puffs into the lungs every 6 (six) hours as needed for wheezing.   metoprolol succinate 25 MG 24 hr tablet Commonly known as: TOPROL-XL Take 1 tablet (25 mg total) by mouth daily. Start taking on: April 28, 2022 What changed:  medication strength See the new instructions.   multivitamin with minerals Tabs tablet Take 1 tablet by mouth daily.   pantoprazole 40 MG tablet Commonly known as: Protonix Take 1 tablet (40 mg total) by mouth daily.   pravastatin 20 MG tablet Commonly known as: PRAVACHOL Take 1 tablet (20 mg total) by mouth daily.   Stiolto Respimat 2.5-2.5 MCG/ACT Aers Generic drug: Tiotropium Bromide-Olodaterol Inhale 2 puffs into the lungs daily.   thiamine 100 MG tablet Commonly known as: VITAMIN B1 Take 1 tablet (100 mg total) by mouth daily.               Discharge Care Instructions  (From admission, onward)           Start     Ordered    04/27/22 0000  Discharge wound care:       Comments: 04/12/22 1331    Wound care  Every shift      Comments: Left foot wound care: betadine wet to dry dressing. Change every shift  Foam dressing  Every 3 days     Comments: Silicone foam dressings to the coccyx/sacral area, change every 3 days. Assess under dressings each shift for any acute changes in the wounds.  Wound care  Daily      Comments:  1. Cleanse hip wound with saline, pat dry 2. Apply Medihoney to wound bed, top with saline moist 2x2 gauzse (not soaked, just damp) 3. Cover with foam dressing Ok to peel back foam daily to change gauze and reapply Medihoney   04/27/22 1200            Consultations: Infectious disease Psychiatry  Procedures/Studies:   US RENAL  Result Date: 04/26/2022 CLINICAL DATA:  Hydronephrosis EXAM: RENAL / URINARY TRACT ULTRASOUND COMPLETE COMPARISON:  CT abdomen/pelvis dated 03/27/2022 FINDINGS: Right Kidney: Renal measurements: 12.2 x 6.2 x 7.0 cm = volume: 280 mL. Echogenic renal parenchyma. No mass. Moderate hydronephrosis, chronic. Left Kidney: Renal measurements: 9.6 x 5.9 x 5.3 cm = volume: 157 mL. Echogenic renal parenchyma. No mass. Moderate hydronephrosis, chronic. Bladder: Distended with layering debris. Other: None. IMPRESSION: Distended urinary bladder with layering debris. Moderate bilateral hydronephrosis, chronic. This favors sequela of chronic bladder outlet obstruction versus neurogenic bladder. Echogenic renal parenchyma, suggesting medical renal disease. Electronically Signed   By: Julian Hy M.D.   On: 04/26/2022 21:36   DG Abd 1 View  Result Date: 04/08/2022 CLINICAL DATA:  86 year old female with abdominal distension. EXAM: ABDOMEN - 1 VIEW COMPARISON:  04/04/2022 FINDINGS: Mild diffuse gaseous distension of the visualized small bowel. Large volume stool burden about the hepatic flexure. Colonic gas is visualized distally. Surgical clips in the right upper quadrant.  Similar appearing dextroconvex curvature of the lumbar spine. IMPRESSION: Nonspecific bowel gas pattern. Moderate colonic stool burden, most prominent in the hepatic flexure. Electronically Signed   By: Ruthann Cancer M.D.   On: 04/08/2022 14:00  CT THORACIC SPINE WO CONTRAST  Result Date: 04/06/2022 CLINICAL DATA:  Compression fracture EXAM: CT THORACIC SPINE WITHOUT CONTRAST TECHNIQUE: Multidetector CT images of the thoracic were obtained using the standard protocol without intravenous contrast. RADIATION DOSE REDUCTION: This exam was performed according to the departmental dose-optimization program which includes automated exposure control, adjustment of the mA and/or kV according to patient size and/or use of iterative reconstruction technique. COMPARISON:  CT AP 04/04/22 FINDINGS: Alignment: Normal. Vertebrae: Redemonstrated acute superior endplate compression deformity of the T12 level with approximate 40% height loss. There is no evidence of retropulsion. Paraspinal and other soft tissues: Bibasilar atelectasis. Mild bilateral hydronephrosis. Disc levels: No evidence of high-grade spinal canal stenosis. IMPRESSION: 1. Redemonstrated acute superior endplate compression deformity at T12 with approximate 40% height loss. No evidence of retropulsion. 2. Mild bilateral hydronephrosis. Electronically Signed   By: Marin Roberts M.D.   On: 04/06/2022 13:47   ECHOCARDIOGRAM COMPLETE  Result Date: 04/06/2022    ECHOCARDIOGRAM REPORT   Patient Name:   [name] Date of Exam: 04/06/2022 Medical Rec #:  [id number]        Height:       63.0 in Accession #:    [id number]       Weight:       104.7 lb Date of Birth:  [birth year]        BSA:          1.469 m Patient Age:    71 years         BP:           113/60 mmHg Patient Gender: F                HR:           78 bpm. Exam Location:  Inpatient Procedure: 2D Echo, Cardiac Doppler, Color Doppler and Intracardiac            Opacification Agent Indications:     Bacteremia  History:        Patient has prior history of Echocardiogram examinations, most                 recent 08/09/2021. Pacemaker and Abnormal ECG, COPD and Stroke,                 Arrythmias:Complete heart block, Signs/Symptoms:Altered Mental                 Status, Alzheimer's and Dizziness/Lightheadedness; Risk                 Factors:Diabetes, Hypertension and Dyslipidemia.  Sonographer:    Roseanna Rainbow RDCS Referring Phys: [id number] [facility]  Sonographer Comments: Technically difficult study due to poor echo windows. Difficult study due to patient sensitivity, ams, and talking throughout exam. Exam delayed for patient care and machine send failure. IMPRESSIONS  1. Left ventricular ejection fraction, by estimation, is 45 to 50%. The left ventricle has mildly decreased function. The left ventricle demonstrates global hypokinesis. Left ventricular diastolic parameters are indeterminate.  2. Right ventricular systolic function is normal. The right ventricular size is normal. There is moderately elevated pulmonary artery systolic pressure.  3. The mitral valve is degenerative. No evidence of mitral valve regurgitation.  4. Leaflets thickened with mild calcification.  5. Aortic valve regurgitation is not visualized. Aortic valve sclerosis/calcification is present, without any evidence of aortic stenosis.  6. The inferior vena cava is dilated in size with <50% respiratory variability, suggesting right atrial pressure of 15  mmHg. Conclusion(s)/Recommendation(s): Has calcification on the aortic/tricuspid and mitral valves, appears unchanged from prior echo. FINDINGS  Left Ventricle: Left ventricular ejection fraction, by estimation, is 45 to 50%. The left ventricle has mildly decreased function. The left ventricle demonstrates global hypokinesis. The left ventricular internal cavity size was normal in size. There is  no left ventricular hypertrophy. Left ventricular diastolic parameters are indeterminate. Right  Ventricle: The right ventricular size is normal. Right ventricular systolic function is normal. There is moderately elevated pulmonary artery systolic pressure. The tricuspid regurgitant velocity is 2.95 m/s, and with an assumed right atrial pressure of 15 mmHg, the estimated right ventricular systolic pressure is 25.3 mmHg. Left Atrium: Left atrial size was normal in size. Right Atrium: Right atrial size was normal in size. Pericardium: There is no evidence of pericardial effusion. Mitral Valve: The mitral valve is degenerative in appearance. There is mild thickening of the mitral valve leaflet(s). There is mild calcification of the mitral valve leaflet(s). No evidence of mitral valve regurgitation. MV peak gradient, 11.8 mmHg. The  mean mitral valve gradient is 5.0 mmHg. Tricuspid Valve: Leaflets thickened with mild calcification. Tricuspid valve regurgitation is mild. Aortic Valve: Aortic valve regurgitation is not visualized. Aortic valve sclerosis/calcification is present, without any evidence of aortic stenosis. Pulmonic Valve: Pulmonic valve regurgitation is not visualized. Aorta: The aortic root and ascending aorta are structurally normal, with no evidence of dilitation. Venous: The inferior vena cava is dilated in size with less than 50% respiratory variability, suggesting right atrial pressure of 15 mmHg. IAS/Shunts: The interatrial septum was not well visualized.  LEFT VENTRICLE PLAX 2D LVIDd:         4.00 cm LVIDs:         2.90 cm LV PW:         1.10 cm LV IVS:        1.60 cm LVOT diam:     2.10 cm LV SV:         36 LV SV Index:   25 LVOT Area:     3.46 cm  LV Volumes (MOD) LV vol d, MOD A2C: 80.7 ml LV vol d, MOD A4C: 70.2 ml LV vol s, MOD A2C: 41.4 ml LV vol s, MOD A4C: 35.1 ml LV SV MOD A2C:     39.3 ml LV SV MOD A4C:     70.2 ml LV SV MOD BP:      36.8 ml RIGHT VENTRICLE            IVC RV S prime:     9.55 cm/s  IVC diam: 3.10 cm TAPSE (M-mode): 1.6 cm LEFT ATRIUM             Index        RIGHT  ATRIUM           Index LA diam:        4.10 cm 2.79 cm/m   RA Area:     11.30 cm LA Vol (A2C):   19.5 ml 13.28 ml/m  RA Volume:   24.90 ml  16.95 ml/m LA Vol (A4C):   20.0 ml 13.62 ml/m LA Biplane Vol: 21.6 ml 14.71 ml/m  AORTIC VALVE LVOT Vmax:   80.40 cm/s LVOT Vmean:  52.050 cm/s LVOT VTI:    0.104 m  AORTA Ao Root diam: 3.50 cm Ao Asc diam:  3.00 cm MITRAL VALVE                TRICUSPID VALVE MV Area (PHT): 4.89  cm     TR Peak grad:   34.8 mmHg MV Area VTI:   1.79 cm     TR Vmax:        295.00 cm/s MV Peak grad:  11.8 mmHg MV Mean grad:  5.0 mmHg     SHUNTS MV Vmax:       1.72 m/s     Systemic VTI:  0.10 m MV Vmean:      101.0 cm/s   Systemic Diam: 2.10 cm MV Decel Time: 155 msec MV E velocity: 153.00 cm/s Phineas Inches Electronically signed by Phineas Inches Signature Date/Time: 04/06/2022/10:49:43 AM    Final    CT ABDOMEN PELVIS WO CONTRAST  Result Date: 04/04/2022 CLINICAL DATA:  Left lower quadrant abdominal pain. EXAM: CT ABDOMEN AND PELVIS WITHOUT CONTRAST TECHNIQUE: Multidetector CT imaging of the abdomen and pelvis was performed following the standard protocol without IV contrast. RADIATION DOSE REDUCTION: This exam was performed according to the departmental dose-optimization program which includes automated exposure control, adjustment of the mA and/or kV according to patient size and/or use of iterative reconstruction technique. COMPARISON:  November 10, 2021. FINDINGS: Lower chest: No acute abnormality. Hepatobiliary: No focal liver abnormality is seen. Status post cholecystectomy. No biliary dilatation. Pancreas: Unremarkable. No pancreatic ductal dilatation or surrounding inflammatory changes. Spleen: Normal in size without focal abnormality. Adrenals/Urinary Tract: Adrenal glands appear normal. Moderate bilateral hydroureteronephrosis is noted without obstructing calculus. This appears to be due to severe urinary bladder distention. Stomach/Bowel: Stomach is unremarkable. There is no evidence  of bowel obstruction or inflammation. Vascular/Lymphatic: Aortic atherosclerosis. No enlarged abdominal or pelvic lymph nodes. Reproductive: Status post hysterectomy. No adnexal masses. Other: No abdominal wall hernia or abnormality. No abdominopelvic ascites. Musculoskeletal: Acute fracture is seen involving superior endplate of G95 vertebral body. IMPRESSION: Severe urinary bladder distention is noted which results in moderate bilateral hydroureteronephrosis. Acute fracture seen involving superior endplate of A21 vertebral body. Aortic Atherosclerosis (ICD10-I70.0). Electronically Signed   By: Marijo Conception M.D.   On: 04/04/2022 17:49   DG Chest 2 View  Result Date: 04/04/2022 CLINICAL DATA:  Weakness and anorexia. EXAM: CHEST - 2 VIEW COMPARISON:  12/08/2021 FINDINGS: The pacer wires are stable. The heart is normal in size. Stable tortuosity and calcification of the thoracic aorta. No acute pulmonary findings. Stable eventration of the right hemidiaphragm peer the bony thorax is intact. IMPRESSION: No acute cardiopulmonary findings. Electronically Signed   By: Marijo Sanes M.D.   On: 04/04/2022 15:06   CT Head Wo Contrast  Result Date: 03/28/2022 CLINICAL DATA:  Provided history: Polytrauma, blunt. Recent fall. EXAM: CT HEAD WITHOUT CONTRAST CT CERVICAL SPINE WITHOUT CONTRAST TECHNIQUE: Multidetector CT imaging of the head and cervical spine was performed following the standard protocol without intravenous contrast. Multiplanar CT image reconstructions of the cervical spine were also generated. RADIATION DOSE REDUCTION: This exam was performed according to the departmental dose-optimization program which includes automated exposure control, adjustment of the mA and/or kV according to patient size and/or use of iterative reconstruction technique. COMPARISON:  Head CT 12/08/2021. CT cervical spine 11/09/2021. FINDINGS: CT HEAD FINDINGS Brain: Moderate cerebral atrophy. Mild-to-moderate cerebellar  atrophy. Moderate patchy and ill-defined hypoattenuation within the cerebral white matter, nonspecific but compatible with chronic small vessel disease. There is no acute intracranial hemorrhage. No demarcated cortical infarct. No extra-axial fluid collection. No evidence of an intracranial mass. No midline shift. Vascular: No hyperdense vessel.  Atherosclerotic calcifications. Skull: No fracture or aggressive osseous lesion. Sinuses/Orbits: No mass or  acute finding within the imaged orbits. No significant paranasal sinus disease at the imaged levels. CT CERVICAL SPINE FINDINGS Mildly motion degraded exam. Alignment: Reversal of the expected cervical lordosis. Slight grade 1 anterolisthesis at C2-C3. Slight grade 1 retrolisthesis at C4-C5. 2 mm grade 1 retrolisthesis at C5-C6. Skull base and vertebrae: The basion-dental and atlanto-dental intervals are maintained.No evidence of acute fracture to the cervical spine. Soft tissues and spinal canal: No prevertebral fluid or swelling. No visible canal hematoma. Disc levels: Cervical spondylosis with multilevel disc space narrowing, disc bulges, uncovertebral hypertrophy and facet arthrosis. No appreciable high-grade spinal canal stenosis. Multilevel bony neural foraminal narrowing. Upper chest: No consolidation within the imaged lung apices. Centrilobular and paraseptal emphysema. IMPRESSION: CT head: 1. No evidence of acute intracranial abnormality. 2. Parenchymal atrophy and chronic small vessel disease, as described. CT cervical spine: 1. No evidence of acute fracture to the cervical spine. 2. Nonspecific reversal of the expected cervical doses. 3. Mild grade 1 spondylolisthesis at C2-C3, C4-C5 and C5-C6. 4. Cervical spondylosis, as described. Electronically Signed   By: Kellie Simmering D.O.   On: 03/28/2022 14:35   CT Cervical Spine Wo Contrast  Result Date: 03/28/2022 CLINICAL DATA:  Provided history: Polytrauma, blunt. Recent fall. EXAM: CT HEAD WITHOUT CONTRAST  CT CERVICAL SPINE WITHOUT CONTRAST TECHNIQUE: Multidetector CT imaging of the head and cervical spine was performed following the standard protocol without intravenous contrast. Multiplanar CT image reconstructions of the cervical spine were also generated. RADIATION DOSE REDUCTION: This exam was performed according to the departmental dose-optimization program which includes automated exposure control, adjustment of the mA and/or kV according to patient size and/or use of iterative reconstruction technique. COMPARISON:  Head CT 12/08/2021. CT cervical spine 11/09/2021. FINDINGS: CT HEAD FINDINGS Brain: Moderate cerebral atrophy. Mild-to-moderate cerebellar atrophy. Moderate patchy and ill-defined hypoattenuation within the cerebral white matter, nonspecific but compatible with chronic small vessel disease. There is no acute intracranial hemorrhage. No demarcated cortical infarct. No extra-axial fluid collection. No evidence of an intracranial mass. No midline shift. Vascular: No hyperdense vessel.  Atherosclerotic calcifications. Skull: No fracture or aggressive osseous lesion. Sinuses/Orbits: No mass or acute finding within the imaged orbits. No significant paranasal sinus disease at the imaged levels. CT CERVICAL SPINE FINDINGS Mildly motion degraded exam. Alignment: Reversal of the expected cervical lordosis. Slight grade 1 anterolisthesis at C2-C3. Slight grade 1 retrolisthesis at C4-C5. 2 mm grade 1 retrolisthesis at C5-C6. Skull base and vertebrae: The basion-dental and atlanto-dental intervals are maintained.No evidence of acute fracture to the cervical spine. Soft tissues and spinal canal: No prevertebral fluid or swelling. No visible canal hematoma. Disc levels: Cervical spondylosis with multilevel disc space narrowing, disc bulges, uncovertebral hypertrophy and facet arthrosis. No appreciable high-grade spinal canal stenosis. Multilevel bony neural foraminal narrowing. Upper chest: No consolidation within  the imaged lung apices. Centrilobular and paraseptal emphysema. IMPRESSION: CT head: 1. No evidence of acute intracranial abnormality. 2. Parenchymal atrophy and chronic small vessel disease, as described. CT cervical spine: 1. No evidence of acute fracture to the cervical spine. 2. Nonspecific reversal of the expected cervical doses. 3. Mild grade 1 spondylolisthesis at C2-C3, C4-C5 and C5-C6. 4. Cervical spondylosis, as described. Electronically Signed   By: Kellie Simmering D.O.   On: 03/28/2022 14:35       The results of significant diagnostics from this hospitalization (including imaging, microbiology, ancillary and laboratory) are listed below for reference.     Microbiology: No results found for this or any previous visit (from the  past 240 hour(s)).   Labs:  CBC: Recent Labs  Lab 04/22/22 0444 04/25/22 0435  WBC 7.4 10.1  HGB 12.2 13.1  HCT 39.5 42.5  MCV 86.6 88.0  PLT 203 194   BMP &GFR Recent Labs  Lab 04/22/22 0444 04/25/22 0435  NA 134* 135  K 4.0 4.4  CL 96* 97*  CO2 29 30  GLUCOSE 166* 97  BUN 13 11  CREATININE 0.37* 0.42*  CALCIUM 8.7* 9.2   Estimated Creatinine Clearance: 40.5 mL/min (A) (by C-G formula based on SCr of 0.42 mg/dL (L)). Liver & Pancreas: No results for input(s): "AST", "ALT", "ALKPHOS", "BILITOT", "PROT", "ALBUMIN" in the last 168 hours. No results for input(s): "LIPASE", "AMYLASE" in the last 168 hours. No results for input(s): "AMMONIA" in the last 168 hours. Diabetic: No results for input(s): "HGBA1C" in the last 72 hours. Recent Labs  Lab 04/26/22 1202 04/26/22 1726 04/26/22 2130 04/27/22 0719 04/27/22 1127  GLUCAP 156* 130* 148* 88 118*   Cardiac Enzymes: No results for input(s): "CKTOTAL", "CKMB", "CKMBINDEX", "TROPONINI" in the last 168 hours. No results for input(s): "PROBNP" in the last 8760 hours. Coagulation Profile: No results for input(s): "INR", "PROTIME" in the last 168 hours. Thyroid Function Tests: No results  for input(s): "TSH", "T4TOTAL", "FREET4", "T3FREE", "THYROIDAB" in the last 72 hours. Lipid Profile: No results for input(s): "CHOL", "HDL", "LDLCALC", "TRIG", "CHOLHDL", "LDLDIRECT" in the last 72 hours. Anemia Panel: No results for input(s): "VITAMINB12", "FOLATE", "FERRITIN", "TIBC", "IRON", "RETICCTPCT" in the last 72 hours. Urine analysis:    Component Value Date/Time   COLORURINE YELLOW 04/21/2022 1140   APPEARANCEUR TURBID (A) 04/21/2022 1140   LABSPEC 1.010 04/21/2022 1140   PHURINE 5.0 04/21/2022 1140   GLUCOSEU 50 (A) 04/21/2022 1140   GLUCOSEU 100 (A) 05/11/2017 1015   HGBUR MODERATE (A) 04/21/2022 1140   BILIRUBINUR NEGATIVE 04/21/2022 1140   BILIRUBINUR Negative 11/25/2021 1415   KETONESUR NEGATIVE 04/21/2022 1140   PROTEINUR 100 (A) 04/21/2022 1140   UROBILINOGEN 0.2 11/25/2021 1415   UROBILINOGEN 0.2 05/11/2017 1015   NITRITE NEGATIVE 04/21/2022 1140   LEUKOCYTESUR LARGE (A) 04/21/2022 1140   Sepsis Labs: Invalid input(s): "PROCALCITONIN", "LACTICIDVEN"   SIGNED:  Mercy Riding, MD  [facility] 04/27/2022, 1:17 PM

## 2022-04-27 NOTE — Progress Notes (Signed)
[facility] Infect[facility] Disease  Date of Admission:  04/04/2022   Total days of inpatient antibiotics 3 weeks  Principal Problem:   Bacteremia Active Problems:   Uncontrolled type 2 diabetes mellitus with hyperglycemia (HCC)   Chronic respiratory failure with hypoxia (HCC)   Lacks decision making capacity   Pressure injury of skin   Pacemaker   Protein-calorie malnutrition, severe   T12 vertebral fracture (HCC)   Chronic osteomyelitis of left foot (HCC)   Constipation   Hydroureteronephrosis          Assessment: 86 YF admitted with:  #Polymicrobial bacteremia(E faecalis, rothia mucilaginosa, Strep sp BCID only 1/3 sets on 11/28) #Left foot osteomyelitis #PPM in place #Dementia -CT on 10/23 showed cortical irregularity of 5th metatarsal head c/f OM, c/f cellulitis -Seen by Dr. Tommy Medal on 11/7 continued doxy added cefadroxil and referred to Ortho -Admitted on 11/28 after presenting with weakness and nausea with abdominal pain.  Found to have polymicrobial bacteremia on 11/28 .  At that point she had been on Doxy x 7 weeks and cefadroxil for about 20 days.  Initially placed on ctx->Unasyn(repeat Cx on 11/30 NG) and then transition to Augmentin.  Plan was to complete 3 weeks of antibiotics in setting polymicrobial bacteremia and left foot osteomyelitis.  Then follow-up with Dr. Drucilla Schmidt outpatient.  - Patient is declining surgical intervention.  Psychiatry engaged and pt found not to have capacity.  TTE on 11/30 showed calcification on aortic/tricuspid and mitral valves, unchanged from prior echo. No TEE given 1/3 sets+ blood Cx on 11/28.   Recommendations: -Pt continues to decline surgery.  -Pt does not have capacity and recommend social worker to contact APS for possible approval for srugery, If approved pt  can be seen by ortho for possible a -F/U Dr. Lucianne Lei dam om 1/17 and continue Augmentin till OP visit given osteomyelitis and PPM in the setting of polymicrobial  bacteremia. I think surgical management would be optimal as the toe is a nidus of infection and she has PPM in place.    Microbiology:   Antibiotics: CTX 11/28-11/29 Augmentin 11/29-p Cultures: Blood 11/28 1/3 polymicrobial(e faecalis, rothia mucilagnosa), BCID+ e faecalis and strep sp 11/30 NG  SUBJECTIVE: Continue to decline surgery Interval:  Afebrile overnight  Review of Systems: Review of Systems  All other systems reviewed and are negative.    Scheduled Meds:  (feeding supplement) PROSource Plus  30 mL Oral BID BM   amoxicillin-clavulanate  1 tablet Oral Q12H   budesonide (PULMICORT) nebulizer solution  0.25 mg Nebulization BID   heparin  5,000 Units Subcutaneous Q8H   insulin aspart  0-15 Units Subcutaneous TID WC   insulin aspart  0-5 Units Subcutaneous QHS   insulin glargine-yfgn  18 Units Subcutaneous QHS   ipratropium-albuterol  3 mL Nebulization BID   lactulose  20 g Oral Daily   lidocaine  1 patch Transdermal Q24H   metoprolol succinate  12.5 mg Oral Daily   polyethylene glycol  17 g Oral BID   senna-docusate  1 tablet Oral BID   Continuous Infusions: PRN Meds:.acetaminophen, acetaminophen, albuterol, bisacodyl, meclizine, ondansetron (ZOFRAN) IV Allergies  Allergen Reactions   Chlordiazepoxide-Clidinium Other (See Comments)    Sleepy,weak   Biaxin [Clarithromycin] Other (See Comments)    Foul taste, abd pain, diarrhea   Tape Other (See Comments)    SKIN IS VERY THIN AND WILL TEAR AND BRUISE EASILY!!   Ultram [Tramadol] Other (See Comments)  Caused tremors   Codeine Other (See Comments)    Upset the stomach    OBJECTIVE: Vitals:   04/26/22 1338 04/26/22 1900 04/26/22 1948 04/27/22 0445  BP: 137/73 107/69 109/64 124/63  Pulse: 93 91 98 100  Resp: 16 17 18 19   Temp: 98.8 F (37.1 C) 98.7 F (37.1 C) 98.2 F (36.8 C) 98.7 F (37.1 C)  TempSrc: Oral Oral Oral Oral  SpO2: 98%  (!) 88% 98%  Weight:    49 kg  Height:       Body mass index is  19.14 kg/m.  Physical Exam Constitutional:      Appearance: Normal appearance.  HENT:     Head: Normocephalic and atraumatic.     Right Ear: Tympanic membrane normal.     Left Ear: Tympanic membrane normal.     Nose: Nose normal.     Mouth/Throat:     Mouth: Mucous membranes are moist.  Eyes:     Extraocular Movements: Extraocular movements intact.     Conjunctiva/sclera: Conjunctivae normal.     Pupils: Pupils are equal, round, and reactive to light.  Cardiovascular:     Rate and Rhythm: Normal rate and regular rhythm.     Heart sounds: No murmur heard.    No friction rub. No gallop.  Pulmonary:     Effort: Pulmonary effort is normal.     Breath sounds: Normal breath sounds.  Abdominal:     General: Abdomen is flat.     Palpations: Abdomen is soft.  Musculoskeletal:        General: Normal range of motion.  Skin:    General: Skin is warm and dry.  Neurological:     General: No focal deficit present.     Mental Status: She is alert and oriented to person, place, and time.  Psychiatric:        Mood and Affect: Mood normal.       Lab Results Lab Results  Component Value Date   WBC 10.1 04/25/2022   HGB 13.1 04/25/2022   HCT 42.5 04/25/2022   MCV 88.0 04/25/2022   PLT 194 04/25/2022    Lab Results  Component Value Date   CREATININE 0.42 (L) 04/25/2022   BUN 11 04/25/2022   NA 135 04/25/2022   K 4.4 04/25/2022   CL 97 (L) 04/25/2022   CO2 30 04/25/2022    Lab Results  Component Value Date   ALT 10 04/12/2022   AST 15 04/12/2022   ALKPHOS 52 04/12/2022   BILITOT 0.3 04/12/2022        Laurice Record, [facility] 04/27/2022, 8:56 AM

## 2022-04-27 NOTE — TOC Transition Note (Signed)
Transition of Care [facility]) - CM/SW Discharge Note   Patient Details  Name: CHERYSH EPPERLY MRN: [id number] Date of Birth: 1935/07/06  Transition of Care [facility]) CM/SW Contact:  Leeroy Cha, RN Phone Number: 04/27/2022, 12:05 PM   Clinical Narrative:    Ptar called at 1205.     Barriers to Discharge: Continued Medical Work up   Patient Goals and CMS Choice     Choice offered to / list presented to : Patient    Discharge Placement                       Discharge Plan and Services   Discharge Planning Services: CM Consult Post Acute Care Choice: [place]                               Social Determinants of Health (SDOH) Interventions     Readmission Risk Interventions   No data to display

## 2022-05-03 DIAGNOSIS — Z9981 Dependence on supplemental oxygen: Secondary | ICD-10-CM | POA: Diagnosis not present

## 2022-05-03 DIAGNOSIS — S22080D Wedge compression fracture of T11-T12 vertebra, subsequent encounter for fracture with routine healing: Secondary | ICD-10-CM | POA: Diagnosis not present

## 2022-05-03 DIAGNOSIS — R7881 Bacteremia: Secondary | ICD-10-CM | POA: Diagnosis not present

## 2022-05-03 DIAGNOSIS — A419 Sepsis, unspecified organism: Secondary | ICD-10-CM | POA: Diagnosis not present

## 2022-05-03 DIAGNOSIS — R339 Retention of urine, unspecified: Secondary | ICD-10-CM | POA: Diagnosis not present

## 2022-05-03 DIAGNOSIS — J9610 Chronic respiratory failure, unspecified whether with hypoxia or hypercapnia: Secondary | ICD-10-CM | POA: Diagnosis not present

## 2022-05-03 DIAGNOSIS — L89202 Pressure ulcer of unspecified hip, stage 2: Secondary | ICD-10-CM | POA: Diagnosis not present

## 2022-05-03 DIAGNOSIS — L89890 Pressure ulcer of other site, unstageable: Secondary | ICD-10-CM | POA: Diagnosis not present

## 2022-05-03 DIAGNOSIS — M86672 Other chronic osteomyelitis, left ankle and foot: Secondary | ICD-10-CM | POA: Diagnosis not present

## 2022-05-03 DIAGNOSIS — Z8639 Personal history of other endocrine, nutritional and metabolic disease: Secondary | ICD-10-CM | POA: Diagnosis not present

## 2022-05-03 DIAGNOSIS — E1165 Type 2 diabetes mellitus with hyperglycemia: Secondary | ICD-10-CM | POA: Diagnosis not present

## 2022-05-03 DIAGNOSIS — Z794 Long term (current) use of insulin: Secondary | ICD-10-CM | POA: Diagnosis not present

## 2022-05-03 DIAGNOSIS — Z681 Body mass index (BMI) 19 or less, adult: Secondary | ICD-10-CM | POA: Diagnosis not present

## 2022-05-03 DIAGNOSIS — E1169 Type 2 diabetes mellitus with other specified complication: Secondary | ICD-10-CM | POA: Diagnosis not present

## 2022-05-03 DIAGNOSIS — N390 Urinary tract infection, site not specified: Secondary | ICD-10-CM | POA: Diagnosis not present

## 2022-05-03 DIAGNOSIS — N1330 Unspecified hydronephrosis: Secondary | ICD-10-CM | POA: Diagnosis not present

## 2022-05-03 DIAGNOSIS — I442 Atrioventricular block, complete: Secondary | ICD-10-CM | POA: Diagnosis not present

## 2022-05-03 DIAGNOSIS — K5909 Other constipation: Secondary | ICD-10-CM | POA: Diagnosis not present

## 2022-05-03 DIAGNOSIS — E440 Moderate protein-calorie malnutrition: Secondary | ICD-10-CM | POA: Diagnosis not present

## 2022-05-07 ENCOUNTER — Inpatient Hospital Stay (HOSPITAL_COMMUNITY)
Admission: EM | Admit: 2022-05-07 | Discharge: 2022-05-11 | DRG: 689 | Disposition: A | Discharge status: Skilled Nursing Facility | Payer: Medicare Other | Source: Skilled Nursing Facility | Attending: Internal Medicine | Admitting: Internal Medicine

## 2022-05-07 ENCOUNTER — Encounter (HOSPITAL_COMMUNITY): Payer: Self-pay | Admitting: Emergency Medicine

## 2022-05-07 ENCOUNTER — Inpatient Hospital Stay (HOSPITAL_COMMUNITY): Payer: Medicare Other

## 2022-05-07 ENCOUNTER — Other Ambulatory Visit: Payer: Self-pay

## 2022-05-07 DIAGNOSIS — J9611 Chronic respiratory failure with hypoxia: Secondary | ICD-10-CM | POA: Diagnosis present

## 2022-05-07 DIAGNOSIS — F419 Anxiety disorder, unspecified: Secondary | ICD-10-CM | POA: Diagnosis not present

## 2022-05-07 DIAGNOSIS — E1149 Type 2 diabetes mellitus with other diabetic neurological complication: Secondary | ICD-10-CM | POA: Diagnosis not present

## 2022-05-07 DIAGNOSIS — Z8673 Personal history of transient ischemic attack (TIA), and cerebral infarction without residual deficits: Secondary | ICD-10-CM

## 2022-05-07 DIAGNOSIS — Z7901 Long term (current) use of anticoagulants: Secondary | ICD-10-CM

## 2022-05-07 DIAGNOSIS — I700 Atherosclerosis of aorta: Secondary | ICD-10-CM | POA: Diagnosis not present

## 2022-05-07 DIAGNOSIS — K219 Gastro-esophageal reflux disease without esophagitis: Secondary | ICD-10-CM | POA: Diagnosis present

## 2022-05-07 DIAGNOSIS — E11649 Type 2 diabetes mellitus with hypoglycemia without coma: Secondary | ICD-10-CM | POA: Diagnosis not present

## 2022-05-07 DIAGNOSIS — Z86718 Personal history of other venous thrombosis and embolism: Secondary | ICD-10-CM | POA: Diagnosis not present

## 2022-05-07 DIAGNOSIS — Z833 Family history of diabetes mellitus: Secondary | ICD-10-CM | POA: Diagnosis not present

## 2022-05-07 DIAGNOSIS — E871 Hypo-osmolality and hyponatremia: Secondary | ICD-10-CM | POA: Diagnosis present

## 2022-05-07 DIAGNOSIS — N820 Vesicovaginal fistula: Secondary | ICD-10-CM | POA: Diagnosis not present

## 2022-05-07 DIAGNOSIS — R58 Hemorrhage, not elsewhere classified: Secondary | ICD-10-CM | POA: Diagnosis not present

## 2022-05-07 DIAGNOSIS — Z87891 Personal history of nicotine dependence: Secondary | ICD-10-CM

## 2022-05-07 DIAGNOSIS — D62 Acute posthemorrhagic anemia: Secondary | ICD-10-CM | POA: Diagnosis not present

## 2022-05-07 DIAGNOSIS — N139 Obstructive and reflux uropathy, unspecified: Secondary | ICD-10-CM | POA: Diagnosis not present

## 2022-05-07 DIAGNOSIS — E785 Hyperlipidemia, unspecified: Secondary | ICD-10-CM | POA: Diagnosis present

## 2022-05-07 DIAGNOSIS — Z66 Do not resuscitate: Secondary | ICD-10-CM | POA: Diagnosis present

## 2022-05-07 DIAGNOSIS — I479 Paroxysmal tachycardia, unspecified: Secondary | ICD-10-CM | POA: Diagnosis not present

## 2022-05-07 DIAGNOSIS — Z515 Encounter for palliative care: Secondary | ICD-10-CM | POA: Diagnosis not present

## 2022-05-07 DIAGNOSIS — N136 Pyonephrosis: Secondary | ICD-10-CM | POA: Diagnosis present

## 2022-05-07 DIAGNOSIS — Z90710 Acquired absence of both cervix and uterus: Secondary | ICD-10-CM

## 2022-05-07 DIAGNOSIS — J9610 Chronic respiratory failure, unspecified whether with hypoxia or hypercapnia: Secondary | ICD-10-CM | POA: Diagnosis not present

## 2022-05-07 DIAGNOSIS — E43 Unspecified severe protein-calorie malnutrition: Secondary | ICD-10-CM | POA: Diagnosis not present

## 2022-05-07 DIAGNOSIS — R54 Age-related physical debility: Secondary | ICD-10-CM | POA: Diagnosis present

## 2022-05-07 DIAGNOSIS — E1165 Type 2 diabetes mellitus with hyperglycemia: Secondary | ICD-10-CM | POA: Diagnosis present

## 2022-05-07 DIAGNOSIS — M869 Osteomyelitis, unspecified: Secondary | ICD-10-CM | POA: Diagnosis not present

## 2022-05-07 DIAGNOSIS — D72829 Elevated white blood cell count, unspecified: Secondary | ICD-10-CM | POA: Diagnosis present

## 2022-05-07 DIAGNOSIS — Z801 Family history of malignant neoplasm of trachea, bronchus and lung: Secondary | ICD-10-CM

## 2022-05-07 DIAGNOSIS — J449 Chronic obstructive pulmonary disease, unspecified: Secondary | ICD-10-CM | POA: Diagnosis present

## 2022-05-07 DIAGNOSIS — L89222 Pressure ulcer of left hip, stage 2: Secondary | ICD-10-CM | POA: Diagnosis not present

## 2022-05-07 DIAGNOSIS — Z9049 Acquired absence of other specified parts of digestive tract: Secondary | ICD-10-CM

## 2022-05-07 DIAGNOSIS — M86672 Other chronic osteomyelitis, left ankle and foot: Secondary | ICD-10-CM | POA: Diagnosis present

## 2022-05-07 DIAGNOSIS — Z794 Long term (current) use of insulin: Secondary | ICD-10-CM | POA: Diagnosis not present

## 2022-05-07 DIAGNOSIS — I10 Essential (primary) hypertension: Secondary | ICD-10-CM | POA: Diagnosis not present

## 2022-05-07 DIAGNOSIS — R310 Gross hematuria: Secondary | ICD-10-CM | POA: Diagnosis present

## 2022-05-07 DIAGNOSIS — L89890 Pressure ulcer of other site, unstageable: Secondary | ICD-10-CM | POA: Diagnosis not present

## 2022-05-07 DIAGNOSIS — Z8249 Family history of ischemic heart disease and other diseases of the circulatory system: Secondary | ICD-10-CM

## 2022-05-07 DIAGNOSIS — Z950 Presence of cardiac pacemaker: Secondary | ICD-10-CM | POA: Diagnosis not present

## 2022-05-07 DIAGNOSIS — Z800 Family history of malignant neoplasm of digestive organs: Secondary | ICD-10-CM | POA: Diagnosis not present

## 2022-05-07 DIAGNOSIS — I959 Hypotension, unspecified: Secondary | ICD-10-CM | POA: Diagnosis not present

## 2022-05-07 DIAGNOSIS — N134 Hydroureter: Secondary | ICD-10-CM | POA: Diagnosis not present

## 2022-05-07 DIAGNOSIS — S22080A Wedge compression fracture of T11-T12 vertebra, initial encounter for closed fracture: Secondary | ICD-10-CM | POA: Diagnosis not present

## 2022-05-07 DIAGNOSIS — E1142 Type 2 diabetes mellitus with diabetic polyneuropathy: Secondary | ICD-10-CM | POA: Diagnosis present

## 2022-05-07 DIAGNOSIS — D6859 Other primary thrombophilia: Secondary | ICD-10-CM | POA: Diagnosis not present

## 2022-05-07 DIAGNOSIS — G9341 Metabolic encephalopathy: Secondary | ICD-10-CM | POA: Diagnosis present

## 2022-05-07 DIAGNOSIS — I482 Chronic atrial fibrillation, unspecified: Secondary | ICD-10-CM | POA: Diagnosis not present

## 2022-05-07 DIAGNOSIS — Z741 Need for assistance with personal care: Secondary | ICD-10-CM | POA: Diagnosis not present

## 2022-05-07 DIAGNOSIS — N939 Abnormal uterine and vaginal bleeding, unspecified: Secondary | ICD-10-CM | POA: Diagnosis not present

## 2022-05-07 DIAGNOSIS — Z823 Family history of stroke: Secondary | ICD-10-CM

## 2022-05-07 DIAGNOSIS — K7581 Nonalcoholic steatohepatitis (NASH): Secondary | ICD-10-CM | POA: Diagnosis present

## 2022-05-07 DIAGNOSIS — I442 Atrioventricular block, complete: Secondary | ICD-10-CM | POA: Diagnosis present

## 2022-05-07 DIAGNOSIS — E118 Type 2 diabetes mellitus with unspecified complications: Secondary | ICD-10-CM | POA: Diagnosis not present

## 2022-05-07 DIAGNOSIS — E639 Nutritional deficiency, unspecified: Secondary | ICD-10-CM | POA: Diagnosis not present

## 2022-05-07 DIAGNOSIS — R1310 Dysphagia, unspecified: Secondary | ICD-10-CM | POA: Diagnosis not present

## 2022-05-07 DIAGNOSIS — R319 Hematuria, unspecified: Secondary | ICD-10-CM | POA: Diagnosis present

## 2022-05-07 DIAGNOSIS — Z7401 Bed confinement status: Secondary | ICD-10-CM | POA: Diagnosis not present

## 2022-05-07 DIAGNOSIS — L89600 Pressure ulcer of unspecified heel, unstageable: Secondary | ICD-10-CM | POA: Diagnosis not present

## 2022-05-07 DIAGNOSIS — Z9981 Dependence on supplemental oxygen: Secondary | ICD-10-CM | POA: Diagnosis not present

## 2022-05-07 DIAGNOSIS — R110 Nausea: Secondary | ICD-10-CM | POA: Diagnosis not present

## 2022-05-07 DIAGNOSIS — N1330 Unspecified hydronephrosis: Secondary | ICD-10-CM | POA: Diagnosis not present

## 2022-05-07 DIAGNOSIS — Z7951 Long term (current) use of inhaled steroids: Secondary | ICD-10-CM

## 2022-05-07 DIAGNOSIS — Z7189 Other specified counseling: Secondary | ICD-10-CM | POA: Diagnosis not present

## 2022-05-07 DIAGNOSIS — I152 Hypertension secondary to endocrine disorders: Secondary | ICD-10-CM | POA: Diagnosis not present

## 2022-05-07 LAB — CBC WITH DIFFERENTIAL/PLATELET
Abs Immature Granulocytes: 0.12 10*3/uL — ABNORMAL HIGH (ref 0.00–0.07)
Basophils Absolute: 0 10*3/uL (ref 0.0–0.1)
Basophils Relative: 0 %
Eosinophils Absolute: 0 10*3/uL (ref 0.0–0.5)
Eosinophils Relative: 0 %
HCT: 27.8 % — ABNORMAL LOW (ref 36.0–46.0)
Hemoglobin: 9.2 g/dL — ABNORMAL LOW (ref 12.0–15.0)
Immature Granulocytes: 1 %
Lymphocytes Relative: 19 %
Lymphs Abs: 2.1 10*3/uL (ref 0.7–4.0)
MCH: 27.3 pg (ref 26.0–34.0)
MCHC: 33.1 g/dL (ref 30.0–36.0)
MCV: 82.5 fL (ref 80.0–100.0)
Monocytes Absolute: 0.5 10*3/uL (ref 0.1–1.0)
Monocytes Relative: 5 %
Neutro Abs: 8.6 10*3/uL — ABNORMAL HIGH (ref 1.7–7.7)
Neutrophils Relative %: 75 %
Platelets: 304 10*3/uL (ref 150–400)
RBC: 3.37 MIL/uL — ABNORMAL LOW (ref 3.87–5.11)
RDW: 14.7 % (ref 11.5–15.5)
WBC: 11.4 10*3/uL — ABNORMAL HIGH (ref 4.0–10.5)
nRBC: 0 % (ref 0.0–0.2)

## 2022-05-07 LAB — COMPREHENSIVE METABOLIC PANEL
ALT: 13 U/L (ref 0–44)
AST: 32 U/L (ref 15–41)
Albumin: 2.1 g/dL — ABNORMAL LOW (ref 3.5–5.0)
Alkaline Phosphatase: 56 U/L (ref 38–126)
Anion gap: 8 (ref 5–15)
BUN: 17 mg/dL (ref 8–23)
CO2: 27 mmol/L (ref 22–32)
Calcium: 8.4 mg/dL — ABNORMAL LOW (ref 8.9–10.3)
Chloride: 93 mmol/L — ABNORMAL LOW (ref 98–111)
Creatinine, Ser: 0.75 mg/dL (ref 0.44–1.00)
GFR, Estimated: >60 mL/min (ref >60)
Glucose, Bld: 112 mg/dL — ABNORMAL HIGH (ref 70–99)
Potassium: 4.3 mmol/L (ref 3.5–5.1)
Sodium: 128 mmol/L — ABNORMAL LOW (ref 135–145)
Total Bilirubin: 0.1 mg/dL — ABNORMAL LOW (ref 0.3–1.2)
Total Protein: 6.2 g/dL — ABNORMAL LOW (ref 6.5–8.1)

## 2022-05-07 LAB — ABO/RH: ABO/RH(D): A POS

## 2022-05-07 LAB — CBG MONITORING, ED: Glucose-Capillary: 94 mg/dL (ref 70–99)

## 2022-05-07 LAB — HEMOGLOBIN AND HEMATOCRIT, BLOOD
HCT: 29 % — ABNORMAL LOW (ref 36.0–46.0)
Hemoglobin: 9.3 g/dL — ABNORMAL LOW (ref 12.0–15.0)

## 2022-05-07 LAB — PREPARE RBC (CROSSMATCH)

## 2022-05-07 MED ORDER — SODIUM CHLORIDE 0.9 % IV BOLUS
500.0000 mL | Freq: Once | INTRAVENOUS | Status: AC
  Administered 2022-05-07: 500 mL via INTRAVENOUS

## 2022-05-07 MED ORDER — ACETAMINOPHEN 650 MG RE SUPP: 650.0000 mg | Freq: Four times a day (QID) | RECTAL | Status: DC | PRN

## 2022-05-07 MED ORDER — ACETAMINOPHEN 325 MG PO TABS: 650.0000 mg | ORAL_TABLET | Freq: Four times a day (QID) | ORAL | Status: DC | PRN

## 2022-05-07 MED ORDER — HYDROMORPHONE HCL 1 MG/ML IJ SOLN
INTRAMUSCULAR | Status: AC
  Administered 2022-05-07: 0.5 mg
  Filled 2022-05-07: qty 1

## 2022-05-07 MED ORDER — SODIUM CHLORIDE 0.9% IV SOLUTION: Freq: Once | INTRAVENOUS | Status: DC

## 2022-05-07 MED ORDER — SODIUM CHLORIDE 0.9 % IV SOLN: INTRAVENOUS | Status: DC

## 2022-05-07 MED ORDER — UMECLIDINIUM BROMIDE 62.5 MCG/ACT IN AEPB
1.0000 | INHALATION_SPRAY | Freq: Every day | RESPIRATORY_TRACT | Status: DC
  Filled 2022-05-07: qty 7

## 2022-05-07 MED ORDER — ARFORMOTEROL TARTRATE 15 MCG/2ML IN NEBU
15.0000 ug | INHALATION_SOLUTION | Freq: Two times a day (BID) | RESPIRATORY_TRACT | Status: DC
  Administered 2022-05-07 – 2022-05-11 (×4): 15 ug via RESPIRATORY_TRACT
  Filled 2022-05-07 (×7): qty 2

## 2022-05-07 MED ORDER — SODIUM CHLORIDE 0.9 % IR SOLN
3000.0000 mL | Status: DC
  Administered 2022-05-07: 3000 mL

## 2022-05-07 MED ORDER — INSULIN ASPART 100 UNIT/ML IJ SOLN
0.0000 [IU] | Freq: Three times a day (TID) | INTRAMUSCULAR | Status: DC
  Administered 2022-05-09: 2 [IU] via SUBCUTANEOUS

## 2022-05-07 MED ORDER — SODIUM CHLORIDE 0.9% FLUSH
3.0000 mL | Freq: Two times a day (BID) | INTRAVENOUS | Status: DC
  Administered 2022-05-07 – 2022-05-10 (×5): 3 mL via INTRAVENOUS

## 2022-05-07 MED ORDER — HYDROMORPHONE HCL 1 MG/ML IJ SOLN: 0.5000 mg | Freq: Once | INTRAMUSCULAR | Status: AC

## 2022-05-07 MED ORDER — ALBUTEROL SULFATE (2.5 MG/3ML) 0.083% IN NEBU
2.5000 mg | INHALATION_SOLUTION | Freq: Four times a day (QID) | RESPIRATORY_TRACT | Status: DC | PRN
  Filled 2022-05-07: qty 3

## 2022-05-07 MED ORDER — SODIUM CHLORIDE 0.9 % IV SOLN
1.0000 g | INTRAVENOUS | Status: DC
  Administered 2022-05-07 – 2022-05-08 (×2): 1 g via INTRAVENOUS
  Filled 2022-05-07 (×3): qty 10

## 2022-05-07 NOTE — ED Triage Notes (Signed)
Pt arrives via EMS [place], per staff has large amounts of vaginal bleeding over the last 3 days on eliquis, pt at baseline. Pt reportedly currently has a UTI.  Chronic COPD wears O2 at 2L. Pt reports abdominal cramping, adb distress noted. VSS. 110/62, HR 101, RR 20, CBG 150. Some mild nausea

## 2022-05-07 NOTE — ED Notes (Signed)
Foley was removed by Dr. Francia Greaves

## 2022-05-07 NOTE — ED Provider Notes (Cosign Needed Addendum)
[facility] EMERGENCY DEPARTMENT Provider Note   CSN: [id number] Arrival date & time: 05/07/22  1228     History  Chief Complaint  Patient presents with   Vaginal Bleeding    [name] is a 86 y.o. female.  86 year old female brought in by EMS from facility for vaginal bleeding on Eliquis.  Report that patient has been bleeding for the past 3 days.  Patient is on Eliquis for A-fib.  Status post hysterectomy.  History of IBS, diverticulosis, esophageal stricture, diabetes, COPD, CVA, osteomyelitis.  States she has never had a transfusion but would except 1 if necessary.       Home Medications Prior to Admission medications   Medication Sig Start Date End Date Taking? Authorizing Provider  acetaminophen (TYLENOL) 325 MG tablet Take 2 tablets (650 mg total) by mouth every 6 (six) hours as needed for mild pain (or Fever >/= 101). 01/23/20   Domenic Polite, MD  albuterol (VENTOLIN HFA) 108 (90 Base) MCG/ACT inhaler Inhale 1-2 puffs into the lungs every 6 (six) hours as needed for wheezing or shortness of breath. 12/26/21   Baird Lyons D, MD  amoxicillin-clavulanate (AUGMENTIN) 875-125 MG tablet Take 1 tablet by mouth every 12 (twelve) hours. 04/27/22 05/27/22  Mercy Riding, MD  apixaban (ELIQUIS) 5 MG TABS tablet Take 1 tablet (5 mg total) by mouth 2 (two) times daily. 02/13/22   Regalado, Belkys A, MD  bisacodyl (DULCOLAX) 10 MG suppository Place 1 suppository (10 mg total) rectally daily as needed for moderate constipation. 04/27/22   Mercy Riding, MD  feeding supplement (ENSURE ENLIVE / ENSURE PLUS) LIQD Take 237 mLs by mouth 2 (two) times daily between meals. 12/12/21   Mariel Aloe, MD  fluorometholone (FML) 0.1 % ophthalmic suspension Place 1 drop into both eyes 3 (three) times daily. 12/01/21   [provider]  insulin aspart (NOVOLOG) 100 UNIT/ML injection Inject 0-15 Units into the skin 3 (three) times daily with meals. CBG < 70: Implement  Hypoglycemia Standing Orders and refer to Hypoglycemia Standing Orders sidebar report CBG 70 - 120: 0 units CBG 121 - 150: 2 units CBG 151 - 200: 3 units CBG 201 - 250: 5 units CBG 251 - 300: 8 units CBG 301 - 350: 11 units CBG 351 - 400: 15 units CBG > 400: call MD and obtain STAT lab verification 04/27/22   Mercy Riding, MD  insulin glargine-yfgn (SEMGLEE) 100 UNIT/ML injection Inject 0.18 mLs (18 Units total) into the skin daily. 04/27/22   Mercy Riding, MD  ipratropium-albuterol (DUONEB) 0.5-2.5 (3) MG/3ML SOLN Use 1 vial in nebulizer every 8 hours and as needed 01/13/22   Regalado, Belkys A, MD  lactulose (CHRONULAC) 10 GM/15ML solution Take 30 mLs (20 g total) by mouth daily. 04/28/22   Mercy Riding, MD  levalbuterol [facility] HFA) 45 MCG/ACT inhaler Inhale 1-2 puffs into the lungs every 6 (six) hours as needed for wheezing. 12/30/21   Deneise Lever, MD  metoprolol succinate (TOPROL-XL) 25 MG 24 hr tablet Take 1 tablet (25 mg total) by mouth daily. 04/28/22   Mercy Riding, MD  Multiple Vitamin (MULTIVITAMIN WITH MINERALS) TABS tablet Take 1 tablet by mouth daily. 08/14/21   Pokhrel, Corrie Mckusick, MD  pantoprazole (PROTONIX) 40 MG tablet Take 1 tablet (40 mg total) by mouth daily. 08/13/21 08/13/22  Pokhrel, Corrie Mckusick, MD  pravastatin (PRAVACHOL) 20 MG tablet Take 1 tablet (20 mg total) by mouth daily. 02/28/21   Sabra Heck,  Lillette Boxer, MD  thiamine 100 MG tablet Take 1 tablet (100 mg total) by mouth daily. 08/14/21   Pokhrel, Corrie Mckusick, MD  Tiotropium Bromide-Olodaterol (STIOLTO RESPIMAT) 2.5-2.5 MCG/ACT AERS Inhale 2 puffs into the lungs daily. 10/20/21   Deneise Lever, MD      Allergies    Chlordiazepoxide-clidinium, Biaxin [clarithromycin], Tape, Ultram [tramadol], and Codeine    Review of Systems   Review of Systems Negative except as per HPI Physical Exam Updated Vital Signs BP 123/66 (BP Location: Right Arm)   Pulse 99   Temp 98.9 F (37.2 C) (Oral)   Resp 18   Ht 5' 3"  (1.6 m)   Wt 54.4 kg    SpO2 94%   BMI 21.26 kg/m  Physical Exam Vitals and nursing note reviewed. Exam conducted with a chaperone present.  Constitutional:      General: She is not in acute distress.    Appearance: She is well-developed. She is not diaphoretic.  HENT:     Head: Normocephalic and atraumatic.  Cardiovascular:     Rate and Rhythm: Normal rate. Rhythm irregular.     Pulses: Normal pulses.     Heart sounds: Normal heart sounds.  Pulmonary:     Effort: Pulmonary effort is normal.     Breath sounds: Normal breath sounds.  Abdominal:     Palpations: There is mass.     Comments: Lower abdominal mass, question distended bladder  Genitourinary:    Vagina: Bleeding present.  Musculoskeletal:     Right lower leg: No edema.     Left lower leg: No edema.  Skin:    General: Skin is warm and dry.     Coloration: Skin is pale.     Findings: No erythema or rash.  Neurological:     Mental Status: She is alert and oriented to person, place, and time.  Psychiatric:        Behavior: Behavior normal.     ED Results / Procedures / Treatments   Labs (all labs ordered are listed, but only abnormal results are displayed) Labs Reviewed  CBC WITH DIFFERENTIAL/PLATELET - Abnormal; Notable for the following components:      Result Value   WBC 11.4 (*)    RBC 3.37 (*)    Hemoglobin 9.2 (*)    HCT 27.8 (*)    Neutro Abs 8.6 (*)    Abs Immature Granulocytes 0.12 (*)    All other components within normal limits  COMPREHENSIVE METABOLIC PANEL - Abnormal; Notable for the following components:   Sodium 128 (*)    Chloride 93 (*)    Glucose, Bld 112 (*)    Calcium 8.4 (*)    Total Protein 6.2 (*)    Albumin 2.1 (*)    Total Bilirubin 0.1 (*)    All other components within normal limits  URINALYSIS, ROUTINE W REFLEX MICROSCOPIC  TYPE AND SCREEN    EKG None  Radiology No results found.  Procedures .Critical Care  Performed by: Tacy Learn, PA-C Authorized by: Tacy Learn, PA-C    Critical care provider statement:    Critical care time (minutes):  30   Critical care was time spent personally by me on the following activities:  Development of treatment plan with patient or surrogate, discussions with consultants, evaluation of patient's response to treatment, examination of patient, ordering and review of laboratory studies, ordering and review of radiographic studies, ordering and performing treatments and interventions, pulse oximetry, re-evaluation of patient's condition and  review of old charts     Medications Ordered in ED Medications  sodium chloride 0.9 % bolus 500 mL (has no administration in time range)    ED Course/ Medical Decision Making/ A&P Clinical Course as of 05/07/22 1531  Sun May 07, 2022  1507 S/P hysterectomy on eliquis - lots of vaginal bleeding. OBGYN has seen - did speculum exam. Recommends urology consult.  [WF]  [id number] We can try 3-way foley vs urology to place? [WF]    Clinical Course User Index [WF] Tedd Sias, [place]                           Medical Decision Making Amount and/or Complexity of Data Reviewed Labs: ordered. Radiology: ordered.   This patient presents to the ED for concern of vaginal bleeding, this involves an extensive number of treatment options, and is a complaint that carries with it a high risk of complications and morbidity.  The differential diagnosis includes but not limited to trauma, mass, infectious process   Co morbidities that complicate the patient evaluation  Osteomyelitis, CVA, hyperglycemia due to diabetes, dementia, complete heart block, hypertension, COPD   Additional history obtained:  Additional history obtained from EMS who notes vaginal bleeding x 3 days, on Eliquis External records from outside source obtained and reviewed including CT abdomen pelvis obtained 04/04/22 with significantly distended bladder, status post hysterectomy.  No evidence of pelvic mass on that imaging.  Prior labs  for comparison   Lab Tests:  I Ordered, and personally interpreted labs.  The pertinent results include: CBC with hemoglobin 9.2, previously 13 on 04/25/2022, leukocytosis white count 11.4.  BM 128   Consultations Obtained:  I requested consultation with the GYN, Dr. Elgie Congo,  and discussed lab and imaging findings as well as pertinent plan - they recommend: no blood in the vagina, recommend consult urology as Dr. Elgie Congo has visualized blood coming from the urethra.  Case discussed with Dr. Vallarie Mare with urology who requests 18-gauge Foley if possible as this is easier to irrigate with.  Request CT abdomen pelvis with and without contrast for hematuria protocol and admit to hospitalist.   Problem List / ED Course / Critical interventions / Medication management  86 year old female brought in by EMS from facility with concern for vaginal bleeding for the past 3 days, on Eliquis for A-fib.  On exam, appeared to have moderate amount of blood from the vagina.  Hemoglobin dropped from 13-9 over the past 12 days, hemodynamically stable otherwise.  Discussed with gynecology who has seen the patient, notes there is no blood from the vagina rather feels the blood is coming from her urethra.  Staff attempted to place a Foley catheter however there was only blood in the catheter which eventually stopped draining.  Ultrasound used at bedside by ER attending to attempt to locate the balloon of Foley catheter, appears to be in the vagina, not in the bladder, appears to have sediment versus clot in the bladder.  Foley catheter was removed, blood continued to leak around the Foley while present.  Case discussed with Dr. Alinda Money with urology, requests admission to hospitalist service, CT A&P with and without contrast for hematuria.  Request placement of 18-gauge Foley catheter if possible as this will be easier to irrigate through than a three-way Foley.  Hold Eliquis.  Dr. Alinda Money to see patient later today. I ordered  medication including IV fluids for hyponatremia Reevaluation of the patient after these  medicines showed that the patient  unchanged I have reviewed the patients home medicines and have made adjustments as needed   Social Determinants of Health:  Lives at facility   Test / Admission - Considered:  Disposition pending at time of signout to oncoming provider         Final Clinical Impression(s) / ED Diagnoses Final diagnoses:  Acute blood loss anemia  Hyponatremia    Rx / DC Orders ED Discharge Orders     None         Tacy Learn, PA-C 05/07/22 1531    Tacy Learn, PA-C 05/07/22 1531    Valarie Merino, MD 05/08/22 [phone]

## 2022-05-07 NOTE — Consult Note (Signed)
Urology Consult   Physician requesting consult: Dr. Fuller Plan  Reason for consult: Hematuria  History of Present Illness: [name] is a 86 y.o. who was at a SNF when she was noted to have blood per her vagina.  This was initially suspected to be vaginal bleeding but a GYN consult did not find an etiology.  It was then felt that it may be hematuria.  Her hemoglobin was 9.2 down from 13.1 on 12/19. The patient is unable to provide an accurate history due to her current mental status, which may be her baseline (I have known her in the past from prior outpatient visits). She has a long history of smoking.  Attempts to place a catheter by the nursing staff was unsuccessful.  She does have a history of recurrent UTIs.  She is anticoagulated with Eliquis for atrial fibrillation.     Past Medical History:  Diagnosis Date   Acute ischemic stroke ([place]) 08/10/2021   Anxiety disorder, unspecified    Arthritis    Benign paroxysmal positional vertigo 08/09/2012   Chronic rhinitis    Cognitive communication deficit    Constipation, unspecified    COPD mixed type ([place])    on home O2   Diverticulosis    DKA (diabetic ketoacidosis) ([place]) 10/02/2021   Esophageal stricture    Essential (primary) hypertension    Gastro-esophageal reflux disease without esophagitis    Glaucoma    Hiatal hernia    Hyperlipidemia, unspecified    Irritable bowel syndrome    Ischemic stroke ([place]) 08/09/2021   Osteomyelitis of fifth toe of left foot ([place]) 03/14/2022   Presence of permanent cardiac pacemaker 01/14/2020   Spinal stenosis, lumbar region, without neurogenic claudication    Steatohepatitis    Thrombocytopenia, unspecified ([place])    Type 2 diabetes mellitus with diabetic polyneuropathy ([place])     Past Surgical History:  Procedure Laterality Date   APPENDECTOMY     CARPAL TUNNEL RELEASE     right hand   CHOLECYSTECTOMY OPEN  1978   COLONOSCOPY  07-2001   mild diverticulosis    ESOPHAGOGASTRODUODENOSCOPY  4650,35-46   H Hernia,es.stricture s/p dil 67F   INTRAOCULAR LENS INSERTION Bilateral    LIVER BIOPSY  09-6810   PACEMAKER IMPLANT N/A 01/14/2020   Procedure: PACEMAKER IMPLANT;  Surgeon: Evans Lance, MD;  Location: [facility];  Service: Cardiovascular;  Laterality: N/A;   PARATHYROID EXPLORATION     TONSILLECTOMY AND ADENOIDECTOMY     TOTAL ABDOMINAL HYSTERECTOMY     ULNAR NERVE TRANSPOSITION  12/07/2011   Procedure: ULNAR NERVE DECOMPRESSION/TRANSPOSITION;this was cancelled-not done  Surgeon: Cammie Sickle., MD;  Location: [facility];  Service: Orthopedics;  Laterality: Right;  right ulnar nerve in situ decompression   ULNAR TUNNEL RELEASE  03/07/2012   Procedure: CUBITAL TUNNEL RELEASE;  Surgeon: Roseanne Kaufman, MD;  Location: [facility];  Service: Orthopedics;  Laterality: Right;  ulnar nerve release at the elbow       Current Hospital Medications:  Home meds:  No current facility-administered medications on file prior to encounter.   Current Outpatient Medications on File Prior to Encounter  Medication Sig Dispense Refill   acetaminophen (TYLENOL) 325 MG tablet Take 2 tablets (650 mg total) by mouth every 6 (six) hours as needed for mild pain (or Fever >/= 101).     albuterol (VENTOLIN HFA) 108 (90 Base) MCG/ACT inhaler Inhale 1-2 puffs into the lungs every 6 (six) hours as needed  for wheezing or shortness of breath. 18 g 6   amoxicillin-clavulanate (AUGMENTIN) 875-125 MG tablet Take 1 tablet by mouth every 12 (twelve) hours. 60 tablet 0   apixaban (ELIQUIS) 5 MG TABS tablet Take 1 tablet (5 mg total) by mouth 2 (two) times daily. 60 tablet 6   bisacodyl (DULCOLAX) 10 MG suppository Place 1 suppository (10 mg total) rectally daily as needed for moderate constipation. 12 suppository 0   feeding supplement (ENSURE ENLIVE / ENSURE PLUS) LIQD Take 237 mLs by mouth 2 (two) times daily between meals. 237 mL 30    fluorometholone (FML) 0.1 % ophthalmic suspension Place 1 drop into both eyes 3 (three) times daily.     insulin aspart (NOVOLOG) 100 UNIT/ML injection Inject 0-15 Units into the skin 3 (three) times daily with meals. CBG < 70: Implement Hypoglycemia Standing Orders and refer to Hypoglycemia Standing Orders sidebar report CBG 70 - 120: 0 units CBG 121 - 150: 2 units CBG 151 - 200: 3 units CBG 201 - 250: 5 units CBG 251 - 300: 8 units CBG 301 - 350: 11 units CBG 351 - 400: 15 units CBG > 400: call MD and obtain STAT lab verification 10 mL 11   insulin glargine-yfgn (SEMGLEE) 100 UNIT/ML injection Inject 0.18 mLs (18 Units total) into the skin daily. 10 mL 11   ipratropium-albuterol (DUONEB) 0.5-2.5 (3) MG/3ML SOLN Use 1 vial in nebulizer every 8 hours and as needed 90 mL 0   lactulose (CHRONULAC) 10 GM/15ML solution Take 30 mLs (20 g total) by mouth daily. 236 mL 0   levalbuterol (XOPENEX HFA) 45 MCG/ACT inhaler Inhale 1-2 puffs into the lungs every 6 (six) hours as needed for wheezing. 15 g 12   metoprolol succinate (TOPROL-XL) 25 MG 24 hr tablet Take 1 tablet (25 mg total) by mouth daily.     Multiple Vitamin (MULTIVITAMIN WITH MINERALS) TABS tablet Take 1 tablet by mouth daily. 100 tablet 0   pantoprazole (PROTONIX) 40 MG tablet Take 1 tablet (40 mg total) by mouth daily. 30 tablet 1   pravastatin (PRAVACHOL) 20 MG tablet Take 1 tablet (20 mg total) by mouth daily. 90 tablet 1   thiamine 100 MG tablet Take 1 tablet (100 mg total) by mouth daily. 100 tablet    Tiotropium Bromide-Olodaterol (STIOLTO RESPIMAT) 2.5-2.5 MCG/ACT AERS Inhale 2 puffs into the lungs daily. 12 g 3     Scheduled Meds:  sodium chloride   Intravenous Once   arformoterol  15 mcg Nebulization BID   And   umeclidinium bromide  1 puff Inhalation Daily   insulin aspart  0-15 Units Subcutaneous TID WC   sodium chloride flush  3 mL Intravenous Q12H   Continuous Infusions:  cefTRIAXone (ROCEPHIN)  IV     PRN  Meds:.acetaminophen **OR** acetaminophen, albuterol  Allergies:  Allergies  Allergen Reactions   Chlordiazepoxide-Clidinium Other (See Comments)    Sleepy,weak   Biaxin [Clarithromycin] Other (See Comments)    Foul taste, abd pain, diarrhea   Tape Other (See Comments)    SKIN IS VERY THIN AND WILL TEAR AND BRUISE EASILY!!   Ultram [Tramadol] Other (See Comments)    Caused tremors   Codeine Other (See Comments)    Upset the stomach    Family History  Problem Relation Age of Onset   Heart disease Father    Lung cancer Mother        small cell;Byssinosis   Lung cancer Sister    Liver cancer Sister        ?  mets from another area of the body   Diabetes Other        grandmother   Stroke Maternal Grandfather     Social History:  reports that she quit smoking about 34 years ago. Her smoking use included cigarettes. She has never used smokeless tobacco. She reports that she does not drink alcohol and does not use drugs.  ROS: A complete review of systems was performed.  All systems are negative except for pertinent findings as noted.  Physical Exam:  Vital signs in last 24 hours: Temp:  [98.1 F (36.7 C)-98.9 F (37.2 C)] 98.1 F (36.7 C) (12/31 1553) Pulse Rate:  [99-100] 100 (12/31 1556) Resp:  [18-22] 22 (12/31 1556) BP: (118-123)/(53-66) 118/53 (12/31 1556) SpO2:  [94 %-98 %] 98 % (12/31 1556) Weight:  [54.4 kg] 54.4 kg (12/31 1243) Constitutional:  Alert, oriented only to person, No acute distress Cardiovascular: Regular rate and rhythm, No JVD Respiratory: Normal respiratory effort GI: Abdomen is slightly distended and tender over SP region and left lower quadrant GU: No CVA tenderness Lymphatic: No lymphadenopathy Neurologic: Grossly intact, no focal deficits Psychiatric: Normal mood and affect  Laboratory Data:  Recent Labs    05/07/22 1246  WBC 11.4*  HGB 9.2*  HCT 27.8*  PLT 304    Recent Labs    05/07/22 1315  NA 128*  K 4.3  CL 93*  GLUCOSE  112*  BUN 17  CALCIUM 8.4*  CREATININE 0.75     Results for orders placed or performed during the hospital encounter of 05/07/22 (from the past 24 hour(s))  CBC with Differential     Status: Abnormal   Collection Time: 05/07/22 12:46 PM  Result Value Ref Range   WBC 11.4 (H) 4.0 - 10.5 K/uL   RBC 3.37 (L) 3.87 - 5.11 MIL/uL   Hemoglobin 9.2 (L) 12.0 - 15.0 g/dL   HCT 27.8 (L) 36.0 - 46.0 %   MCV 82.5 80.0 - 100.0 fL   MCH 27.3 26.0 - 34.0 pg   MCHC 33.1 30.0 - 36.0 g/dL   RDW 14.7 11.5 - 15.5 %   Platelets 304 150 - 400 K/uL   nRBC 0.0 0.0 - 0.2 %   Neutrophils Relative % 75 %   Neutro Abs 8.6 (H) 1.7 - 7.7 K/uL   Lymphocytes Relative 19 %   Lymphs Abs 2.1 0.7 - 4.0 K/uL   Monocytes Relative 5 %   Monocytes Absolute 0.5 0.1 - 1.0 K/uL   Eosinophils Relative 0 %   Eosinophils Absolute 0.0 0.0 - 0.5 K/uL   Basophils Relative 0 %   Basophils Absolute 0.0 0.0 - 0.1 K/uL   Immature Granulocytes 1 %   Abs Immature Granulocytes 0.12 (H) 0.00 - 0.07 K/uL  ABO/Rh     Status: None   Collection Time: 05/07/22 12:46 PM  Result Value Ref Range   ABO/RH(D)      A POS Performed at [facility], [address], [place], [place] [address]   Comprehensive metabolic panel     Status: Abnormal   Collection Time: 05/07/22  1:15 PM  Result Value Ref Range   Sodium 128 (L) 135 - 145 mmol/L   Potassium 4.3 3.5 - 5.1 mmol/L   Chloride 93 (L) 98 - 111 mmol/L   CO2 27 22 - 32 mmol/L   Glucose, Bld 112 (H) 70 - 99 mg/dL   BUN 17 8 - 23 mg/dL   Creatinine, Ser 0.75 0.44 - 1.00  mg/dL   Calcium 8.4 (L) 8.9 - 10.3 mg/dL   Total Protein 6.2 (L) 6.5 - 8.1 g/dL   Albumin 2.1 (L) 3.5 - 5.0 g/dL   AST 32 15 - 41 U/L   ALT 13 0 - 44 U/L   Alkaline Phosphatase 56 38 - 126 U/L   Total Bilirubin 0.1 (L) 0.3 - 1.2 mg/dL   GFR, Estimated >60 >60 mL/min   Anion gap 8 5 - 15  Type and screen [place]     Status: None (Preliminary result)   Collection Time: 05/07/22  2:00 PM   Result Value Ref Range   ABO/RH(D) A POS    Antibody Screen NEG    Sample Expiration      05/10/2022,2359 Performed at [facility], [place] [address] [place], [place] [address]    Unit Number [id number]    Blood Component Type RBC LR PHER1    Unit division 00    Status of Unit ALLOCATED    Transfusion Status OK TO TRANSFUSE    Crossmatch Result Compatible   CBG monitoring, ED     Status: None   Collection Time: 05/07/22  5:06 PM  Result Value Ref Range   Glucose-Capillary 94 70 - 99 mg/dL  Prepare RBC (crossmatch)     Status: None   Collection Time: 05/07/22  6:32 PM  Result Value Ref Range   Order Confirmation      ORDER PROCESSED BY BLOOD BANK Performed at [facility], [place] [address] [place], [place] [address]    *Note: Due to a large number of results and/or encounters for the requested time period, some results have not been displayed. A complete set of results can be found in Results Review.   No results found for this or any previous visit (from the past 240 hour(s)).  Renal Function: Recent Labs    05/07/22 1315  CREATININE 0.75   Estimated Creatinine Clearance: 43.3 mL/min (by C-G formula based on SCr of 0.75 mg/dL).  Procedure: I initially placed a 14 Fr catheter under sterile conditions with dark, thick red blood from the catheter.  This was replaced with a 22 Fr 3 way hematuria catheter.  I removed approximately 700 cc of thick, dark blood with some clots.  Her urine continued to be fairly red when hand irrigated and I started continuous bladder irrigation with NS which was able to be titrated to a moderate to slow drip with the urine light pink tinged.  Impression/Recommendation: Gross hematuria: Continue CBI with NS with titration as needed to keep urine light pink.  Follow serial H/H as this does appear to be a significant source of bleeding.  Hold anticoagulation for now due to bleeding risk.  She will need a full hematuria evaluation  including upper urinary tract imaging with CT scan (pending) and eventually cystoscopy as an outpatient once her acute bleeding has ceased.   Dutch Gray 05/07/2022, 7:02 PM  Pryor Curia. MD   CC: Dr. Fuller Plan

## 2022-05-07 NOTE — H&P (Addendum)
History and Physical    Patient: [name] JSH:[id number] DOB: 1935/07/04 DOA: 05/07/2022 DOS: the patient was seen and examined on 05/07/2022 PCP: Wardell Honour, MD  Patient coming from: [facility] and rehab via EMS  Chief Complaint:  Chief Complaint  Patient presents with   Vaginal Bleeding   HPI: [name] is a 86 y.o. female with medical history significant of complete heart block  s/p permanent pacemaker, chronic respiratory failure on 2-3 L of oxygen, DM type II, DVT on chronic anticoagulation, frequent UTIs, chronic left foot osteomyelitis who presents with reports of large amounts of what was initially thought to be vaginal bleeding over the last 3 days.  History is limited from patient as she seems to intermittently be confused and drifts off.  She did complain of pain in the lower abdomen.  In the emergency department patient was noted to be afebrile with mild tachypnea and all other vital signs maintained.  Labs significant for a WBC 11.4, hemoglobin 9.2, and sodium 128.  OB/GYN has been consulted and perform speculum exam which was thought to likely to have a urinary source.  Urology had been consulted and recommended obtaining CT scan of the abdomen pelvis to see there is a clear source of bleeding.  Patient had been typed and screened for possible need of blood products.  Patient has been given 500 mL of normal saline IV fluids.  Review of Systems: As mentioned in the history of present illness. All other systems reviewed and are negative. Past Medical History:  Diagnosis Date   Acute ischemic stroke ([place]) 08/10/2021   Anxiety disorder, unspecified    Arthritis    Benign paroxysmal positional vertigo 08/09/2012   Chronic rhinitis    Cognitive communication deficit    Constipation, unspecified    COPD mixed type ([place])    on home O2   Diverticulosis    DKA (diabetic ketoacidosis) ([place]) 10/02/2021   Esophageal stricture    Essential (primary)  hypertension    Gastro-esophageal reflux disease without esophagitis    Glaucoma    Hiatal hernia    Hyperlipidemia, unspecified    Irritable bowel syndrome    Ischemic stroke ([place]) 08/09/2021   Osteomyelitis of fifth toe of left foot ([place]) 03/14/2022   Presence of permanent cardiac pacemaker 01/14/2020   Spinal stenosis, lumbar region, without neurogenic claudication    Steatohepatitis    Thrombocytopenia, unspecified ([place])    Type 2 diabetes mellitus with diabetic polyneuropathy ([place])    Past Surgical History:  Procedure Laterality Date   APPENDECTOMY     CARPAL TUNNEL RELEASE     right hand   CHOLECYSTECTOMY OPEN  1978   COLONOSCOPY  07-2001   mild diverticulosis   ESOPHAGOGASTRODUODENOSCOPY  8502,77-41   H Hernia,es.stricture s/p dil 39F   INTRAOCULAR LENS INSERTION Bilateral    LIVER BIOPSY  06-8784   PACEMAKER IMPLANT N/A 01/14/2020   Procedure: PACEMAKER IMPLANT;  Surgeon: Evans Lance, MD;  Location: [facility];  Service: Cardiovascular;  Laterality: N/A;   PARATHYROID EXPLORATION     TONSILLECTOMY AND ADENOIDECTOMY     TOTAL ABDOMINAL HYSTERECTOMY     ULNAR NERVE TRANSPOSITION  12/07/2011   Procedure: ULNAR NERVE DECOMPRESSION/TRANSPOSITION;this was cancelled-not done  Surgeon: Cammie Sickle., MD;  Location: [facility];  Service: Orthopedics;  Laterality: Right;  right ulnar nerve in situ decompression   ULNAR TUNNEL RELEASE  03/07/2012   Procedure: CUBITAL TUNNEL RELEASE;  Surgeon: Roseanne Kaufman,  MD;  Location: [facility];  Service: Orthopedics;  Laterality: Right;  ulnar nerve release at the elbow     Social History:  reports that she quit smoking about 34 years ago. Her smoking use included cigarettes. She has never used smokeless tobacco. She reports that she does not drink alcohol and does not use drugs.  Allergies  Allergen Reactions   Chlordiazepoxide-Clidinium Other (See Comments)    Sleepy,weak   Biaxin  [Clarithromycin] Other (See Comments)    Foul taste, abd pain, diarrhea   Tape Other (See Comments)    SKIN IS VERY THIN AND WILL TEAR AND BRUISE EASILY!!   Ultram [Tramadol] Other (See Comments)    Caused tremors   Codeine Other (See Comments)    Upset the stomach    Family History  Problem Relation Age of Onset   Heart disease Father    Lung cancer Mother        small cell;Byssinosis   Lung cancer Sister    Liver cancer Sister        ? mets from another area of the body   Diabetes Other        grandmother   Stroke Maternal Grandfather     Prior to Admission medications   Medication Sig Start Date End Date Taking? Authorizing Provider  acetaminophen (TYLENOL) 325 MG tablet Take 2 tablets (650 mg total) by mouth every 6 (six) hours as needed for mild pain (or Fever >/= 101). 01/23/20   Domenic Polite, MD  albuterol (VENTOLIN HFA) 108 (90 Base) MCG/ACT inhaler Inhale 1-2 puffs into the lungs every 6 (six) hours as needed for wheezing or shortness of breath. 12/26/21   Baird Lyons D, MD  amoxicillin-clavulanate (AUGMENTIN) 875-125 MG tablet Take 1 tablet by mouth every 12 (twelve) hours. 04/27/22 05/27/22  Mercy Riding, MD  apixaban (ELIQUIS) 5 MG TABS tablet Take 1 tablet (5 mg total) by mouth 2 (two) times daily. 02/13/22   Regalado, Belkys A, MD  bisacodyl (DULCOLAX) 10 MG suppository Place 1 suppository (10 mg total) rectally daily as needed for moderate constipation. 04/27/22   Mercy Riding, MD  feeding supplement (ENSURE ENLIVE / ENSURE PLUS) LIQD Take 237 mLs by mouth 2 (two) times daily between meals. 12/12/21   Mariel Aloe, MD  fluorometholone (FML) 0.1 % ophthalmic suspension Place 1 drop into both eyes 3 (three) times daily. 12/01/21   [provider]  insulin aspart (NOVOLOG) 100 UNIT/ML injection Inject 0-15 Units into the skin 3 (three) times daily with meals. CBG < 70: Implement Hypoglycemia Standing Orders and refer to Hypoglycemia Standing Orders sidebar  report CBG 70 - 120: 0 units CBG 121 - 150: 2 units CBG 151 - 200: 3 units CBG 201 - 250: 5 units CBG 251 - 300: 8 units CBG 301 - 350: 11 units CBG 351 - 400: 15 units CBG > 400: call MD and obtain STAT lab verification 04/27/22   Mercy Riding, MD  insulin glargine-yfgn (SEMGLEE) 100 UNIT/ML injection Inject 0.18 mLs (18 Units total) into the skin daily. 04/27/22   Mercy Riding, MD  ipratropium-albuterol (DUONEB) 0.5-2.5 (3) MG/3ML SOLN Use 1 vial in nebulizer every 8 hours and as needed 01/13/22   Regalado, Belkys A, MD  lactulose (CHRONULAC) 10 GM/15ML solution Take 30 mLs (20 g total) by mouth daily. 04/28/22   Mercy Riding, MD  levalbuterol (XOPENEX HFA) 45 MCG/ACT inhaler Inhale 1-2 puffs into the lungs every 6 (six) hours  as needed for wheezing. 12/30/21   Deneise Lever, MD  metoprolol succinate (TOPROL-XL) 25 MG 24 hr tablet Take 1 tablet (25 mg total) by mouth daily. 04/28/22   Mercy Riding, MD  Multiple Vitamin (MULTIVITAMIN WITH MINERALS) TABS tablet Take 1 tablet by mouth daily. 08/14/21   Pokhrel, Corrie Mckusick, MD  pantoprazole (PROTONIX) 40 MG tablet Take 1 tablet (40 mg total) by mouth daily. 08/13/21 08/13/22  Pokhrel, Corrie Mckusick, MD  pravastatin (PRAVACHOL) 20 MG tablet Take 1 tablet (20 mg total) by mouth daily. 02/28/21   Wardell Honour, MD  thiamine 100 MG tablet Take 1 tablet (100 mg total) by mouth daily. 08/14/21   Pokhrel, Corrie Mckusick, MD  Tiotropium Bromide-Olodaterol (STIOLTO RESPIMAT) 2.5-2.5 MCG/ACT AERS Inhale 2 puffs into the lungs daily. 10/20/21   Baird Lyons D, MD    Physical Exam: Vitals:   05/07/22 1243 05/07/22 1250 05/07/22 1553 05/07/22 1556  BP:  123/66  (!) 118/53  Pulse:  99  100  Resp:  18  (!) 22  Temp:  98.9 F (37.2 C) 98.1 F (36.7 C)   TempSrc:  Oral Oral   SpO2:  94%  98%  Weight: 54.4 kg     Height: 5' 3"  (1.6 m)       Constitutional: Frail elderly female who appears to Eyes: PERRL, lids and conjunctivae normal ENMT: Mucous membranes are dry. Neck:  normal, supple,  Respiratory: O2 saturations maintained on 2 L of oxygen. Cardiovascular: Regular rate and rhythm, no murmurs / rubs / gallops. No extremity edema.   Abdomen: Suprapubic tenderness to palpation appreciated.  Bowel sounds positive.  Musculoskeletal: no clubbing / cyanosis. No joint deformity upper and lower extremities. Good ROM, no contractures. Normal muscle tone.  Skin: Pallor is not. Neurologic: CN 2-12 grossly intact.   Strength 5/5 in all 4.  Psychiatric: Alert and oriented at least to person and place.  Intermittently seems confused   Data Reviewed:  EKG revealed atrial sensed and ventricularly paced rhythm.  Reviewed labs, imaging, and pertinent records as noted above in HPI.  Assessment and Plan: Acute blood loss anemia secondary to hematuria Patient presents with complaints of bleeding.  Hemoglobin 9.2, but previously been 13.1 on 12/19. Initially thought to be vaginal, but seem more likely bladder after being evaluated with speculum exam by OB/GYN .  CT scan of the abdomen pelvis had been ordered.  Urology evaluated and placed Foley catheter producing large clots of blood and she was placed on continuous bladder irrigation.  Patient had already been typed and screened. -Admit to a progressive bed -Monitor intake and output -Check urinalysis and urine culture -Hold Eliquis -Serial monitoring of H&H.  Transfuse blood products as needed for blood counts less than 8 g/dL.  Initially placed orders to transfuse 1 unit of PRBCs, but repeat H&H stable at 9.3 g/dL and therefore discontinued. -Continue Foley catheter with bladder irrigation -Urology consulted, will follow-up for any further recommendations  Acute metabolic encephalopathy Patient noted to intermittently be confused and altered.  Suspect secondary to infection. -Delirium precautions -Neurochecks  Leukocytosis Acute.  WBC elevated 11.4.  Suspect urinary tract infection as the likely cause of  symptoms. -Follow-up urinalysis -Start empiric antibiotics with Rocephin IV  Bilateral hydronephrosis Noted on prior scans of the abdomen pelvis.  Thought to be likely obstructive due to clotted blood in the bladder. -Continue Foley catheter  History of DVT on chronic anticoagulation Patient has been found to have a DVT of the left lower extremity back  in 01/2022.  Patient had been on Eliquis for treatment. -Hold Eliquis due to bleeding acute setting -Check Doppler ultrasound of the lower extremities for resolution of DVT.  If so, to place on SCDs  Essential hypertension Blood pressures have been stable at this time 118/53-123/66. -Hold home blood pressure medications in the setting of acute bleeding  COPD, without acute exacerbation  Chronic respiratory failure with hypoxia Chronic. Home O2 of 2-3 L/min.  No wheezing noted on physical exam at this time. -Continue 2 to 3 L of nasal cannula oxygen at this time -Continue home breathing treatments   Uncontrolled type 2 diabetes mellitus with hyperglycemia (HCC) Last hemoglobin A1c 12.9 on 11/29.   -Hypoglycemic protocols -CBGs before every meal with moderate SSI -Resume Lantus 18 units daily once tolerating p.o.  Chronic osteomyelitis of the left foot Patient recommended to be on Augmentin twice daily and has been followed in the outpatient setting by infectious disease and wound care.  Previously refused surgery. -Hold Augmentin   T12 vertebral fracture Noted during last hospitalization on 11/28.  Patient had been in rehab.  Complete heart block s/p permanent pacemaker    DVT prophylaxis: TED hose Advance Care Planning:   Code Status: DNR    Consults: Urology  Family Communication: updated Gerald Stabs but not local and lives in [place]  Severity of Illness: The appropriate patient status for this patient is INPATIENT. Inpatient status is judged to be reasonable and necessary in order to provide the required intensity of service  to ensure the patient's safety. The patient's presenting symptoms, physical exam findings, and initial radiographic and laboratory data in the context of their chronic comorbidities is felt to place them at high risk for further clinical deterioration. Furthermore, it is not anticipated that the patient will be medically stable for discharge from the hospital within 2 midnights of admission.   * I certify that at the point of admission it is my clinical judgment that the patient will require inpatient hospital care spanning beyond 2 midnights from the point of admission due to high intensity of service, high risk for further deterioration and high frequency of surveillance required.*  Author: Norval Morton, MD 05/07/2022 4:06 PM  For on call review [email or web address].

## 2022-05-07 NOTE — ED Provider Notes (Signed)
Accepted handoff at shift change from LM PA-C. Please see prior provider note for more detail.   Briefly: Patient is 86 y.o.    Plan: admit to medicine     Physical Exam  BP (!) 118/53 (BP Location: Right Arm)   Pulse 100   Temp 98.1 F (36.7 C) (Oral)   Resp (!) 22   Ht 5' 3"  (1.6 m)   Wt 54.4 kg   SpO2 98%   BMI 21.26 kg/m   Physical Exam  Procedures  Procedures  ED Course / MDM   Clinical Course as of 05/07/22 1611  Sun May 07, 2022  1507 S/P hysterectomy on eliquis - lots of vaginal bleeding. OBGYN has seen - did speculum exam. Recommends urology consult.  [WF]  6010 We can try 3-way foley vs urology to place? [WF]    Clinical Course User Index [WF] Tedd Sias, [place]   Medical Decision Making Amount and/or Complexity of Data Reviewed Labs: ordered. Radiology: ordered.  Risk Decision regarding hospitalization.   Prior team discussed with gyn and urology.  Urology to see after CT which was ordered by prior team.  Gyn has done exam and believes this is urethral        Tedd Sias, PA 05/07/22 1612    Tretha Sciara, MD 05/07/22 2255

## 2022-05-07 NOTE — Consult Note (Signed)
OB/GYN Consult Note  Referring Provider: Pati Gallo, PA  [name] is a 86 y.o. Presenting for presumed vaginal bleeding. Gyn consulted for vaginal bleeding.  Pt is somewhat of a poor historian. She is known to be on eliquis due to atrial fibrillation.  The patient notes approximately 3 weeks of vaginal bleeding.  Pt is s/p hysterectomy.  She current resides at a rest home.  The patient denies any inappropriate touch or vaginal penetration.        Past Medical History:  Diagnosis Date   Acute ischemic stroke ([place]) 08/10/2021   Anxiety disorder, unspecified    Arthritis    Benign paroxysmal positional vertigo 08/09/2012   Chronic rhinitis    Cognitive communication deficit    Constipation, unspecified    COPD mixed type ([place])    on home O2   Diverticulosis    DKA (diabetic ketoacidosis) ([place]) 10/02/2021   Esophageal stricture    Essential (primary) hypertension    Gastro-esophageal reflux disease without esophagitis    Glaucoma    Hiatal hernia    Hyperlipidemia, unspecified    Irritable bowel syndrome    Ischemic stroke ([place]) 08/09/2021   Osteomyelitis of fifth toe of left foot ([place]) 03/14/2022   Presence of permanent cardiac pacemaker 01/14/2020   Spinal stenosis, lumbar region, without neurogenic claudication    Steatohepatitis    Thrombocytopenia, unspecified ([place])    Type 2 diabetes mellitus with diabetic polyneuropathy ([place])     Past Surgical History:  Procedure Laterality Date   APPENDECTOMY     CARPAL TUNNEL RELEASE     right hand   CHOLECYSTECTOMY OPEN  1978   COLONOSCOPY  07-2001   mild diverticulosis   ESOPHAGOGASTRODUODENOSCOPY  7846,96-29   H Hernia,es.stricture s/p dil 35F   INTRAOCULAR LENS INSERTION Bilateral    LIVER BIOPSY  09-2839   PACEMAKER IMPLANT N/A 01/14/2020   Procedure: PACEMAKER IMPLANT;  Surgeon: Evans Lance, MD;  Location: [facility];  Service: Cardiovascular;  Laterality: N/A;   PARATHYROID EXPLORATION      TONSILLECTOMY AND ADENOIDECTOMY     TOTAL ABDOMINAL HYSTERECTOMY     ULNAR NERVE TRANSPOSITION  12/07/2011   Procedure: ULNAR NERVE DECOMPRESSION/TRANSPOSITION;this was cancelled-not done  Surgeon: Cammie Sickle., MD;  Location: [facility];  Service: Orthopedics;  Laterality: Right;  right ulnar nerve in situ decompression   ULNAR TUNNEL RELEASE  03/07/2012   Procedure: CUBITAL TUNNEL RELEASE;  Surgeon: Roseanne Kaufman, MD;  Location: [facility];  Service: Orthopedics;  Laterality: Right;  ulnar nerve release at the elbow      OB History  No obstetric history on file.    Social History   Socioeconomic History   Marital status: Single    Spouse name: John,divorced, deceased   Number of children: 0   Years of education: high school   Highest education level: High school graduate  Occupational History   Occupation: Retired     Comment: Training and development officer  Tobacco Use   Smoking status: Former    Types: Cigarettes    Quit date: 05/08/1988    Years since quitting: 34.0   Smokeless tobacco: Never  Vaping Use   Vaping Use: Never used  Substance and Sexual Activity   Alcohol use: No    Alcohol/week: 0.0 standard drinks of alcohol   Drug use: No   Sexual activity: Not Currently  Other Topics Concern   Not on file  Social History Narrative   Not on  file   Social Determinants of Health   Financial Resource Strain: Low Risk  (10/24/2021)   Overall Financial Resource Strain (CARDIA)    Difficulty of Paying Living Expenses: Not hard at all  Food Insecurity: No Food Insecurity (01/16/2022)   Hunger Vital Sign    Worried About Running Out of Food in the Last Year: Never true    Ran Out of Food in the Last Year: Never true  Transportation Needs: No Transportation Needs (01/16/2022)   PRAPARE - Hydrologist (Medical): No    Lack of Transportation (Non-Medical): No  Recent Concern: Transportation Needs - Unmet Transportation Needs  (10/24/2021)   PRAPARE - Transportation    Lack of Transportation (Medical): Yes    Lack of Transportation (Non-Medical): Yes  Physical Activity: Not on file  Stress: No Stress Concern Present (04/28/2020)   [place]    Feeling of Stress : Only a little  Social Connections: Moderately Isolated (02/17/2020)   Social Connection and Isolation Panel [NHANES]    Frequency of Communication with Friends and Family: More than three times a week    Frequency of Social Gatherings with Friends and Family: More than three times a week    Attends Religious Services: More than 4 times per year    Active Member of [employer] or Organizations: No    Attends Music therapist: Patient refused    Marital Status: Divorced    Family History  Problem Relation Age of Onset   Heart disease Father    Lung cancer Mother        small cell;Byssinosis   Lung cancer Sister    Liver cancer Sister        ? mets from another area of the body   Diabetes Other        grandmother   Stroke Maternal Grandfather     (Not in a hospital admission)   Allergies  Allergen Reactions   Chlordiazepoxide-Clidinium Other (See Comments)    Sleepy,weak   Biaxin [Clarithromycin] Other (See Comments)    Foul taste, abd pain, diarrhea   Tape Other (See Comments)    SKIN IS VERY THIN AND WILL TEAR AND BRUISE EASILY!!   Ultram [Tramadol] Other (See Comments)    Caused tremors   Codeine Other (See Comments)    Upset the stomach    Review of Systems: Negative except for what is mentioned in HPI.     Physical Exam: BP (!) 118/53 (BP Location: Right Arm)   Pulse 100   Temp 98.1 F (36.7 C) (Oral)   Resp (!) 22   Ht 5' 3"  (1.6 m)   Wt 54.4 kg   SpO2 98%   BMI 21.26 kg/m  CONSTITUTIONAL:Thin, emaciated female in mild distress.  HENT:  Normocephalic, atraumatic, External right and left ear normal. Oropharynx is clear and moist EYES:  Conjunctivae and EOM are normal.  NECK: Normal range of motion, supple, no masses SKIN: Skin is warm and dry. Skin is thin and multiple healing pressure ulcers were noted. [place]: Alert and oriented to person, place, and time. Grossly normal neuro exam. PSYCHIATRIC: Normal mood and affect. Normal behavior. Normal judgment and thought content. PELVIC: severely atrophic vaginal tissue.  Absent cervix and uterus.  No ulcers or lacerations noted in the vagina.  No active bleeding noted. Exam suboptimal due to patient discomfort. As speculum was removed, frank blood was seen exiting from the urethra which then  collected in the vagina. MUSCULOSKELETAL: Normal range of motion. No edema and no tenderness. 2+ distal pulses.  Pelvic exam done with  chaperone present.  Pertinent Labs/Studies:   Results for orders placed or performed during the hospital encounter of 05/07/22 (from the past 72 hour(s))  CBC with Differential     Status: Abnormal   Collection Time: 05/07/22 12:46 PM  Result Value Ref Range   WBC 11.4 (H) 4.0 - 10.5 K/uL   RBC 3.37 (L) 3.87 - 5.11 MIL/uL   Hemoglobin 9.2 (L) 12.0 - 15.0 g/dL   HCT 27.8 (L) 36.0 - 46.0 %   MCV 82.5 80.0 - 100.0 fL   MCH 27.3 26.0 - 34.0 pg   MCHC 33.1 30.0 - 36.0 g/dL   RDW 14.7 11.5 - 15.5 %   Platelets 304 150 - 400 K/uL   nRBC 0.0 0.0 - 0.2 %   Neutrophils Relative % 75 %   Neutro Abs 8.6 (H) 1.7 - 7.7 K/uL   Lymphocytes Relative 19 %   Lymphs Abs 2.1 0.7 - 4.0 K/uL   Monocytes Relative 5 %   Monocytes Absolute 0.5 0.1 - 1.0 K/uL   Eosinophils Relative 0 %   Eosinophils Absolute 0.0 0.0 - 0.5 K/uL   Basophils Relative 0 %   Basophils Absolute 0.0 0.0 - 0.1 K/uL   Immature Granulocytes 1 %   Abs Immature Granulocytes 0.12 (H) 0.00 - 0.07 K/uL    Comment: Performed at [facility], [address], [place] [address]  ABO/Rh     Status: None (Preliminary result)   Collection Time: 05/07/22 12:46 PM  Result Value Ref Range    ABO/RH(D) PENDING   Comprehensive metabolic panel     Status: Abnormal   Collection Time: 05/07/22  1:15 PM  Result Value Ref Range   Sodium 128 (L) 135 - 145 mmol/L   Potassium 4.3 3.5 - 5.1 mmol/L   Chloride 93 (L) 98 - 111 mmol/L   CO2 27 22 - 32 mmol/L   Glucose, Bld 112 (H) 70 - 99 mg/dL    Comment: Glucose reference range applies only to samples taken after fasting for at least 8 hours.   BUN 17 8 - 23 mg/dL   Creatinine, Ser 0.75 0.44 - 1.00 mg/dL   Calcium 8.4 (L) 8.9 - 10.3 mg/dL   Total Protein 6.2 (L) 6.5 - 8.1 g/dL   Albumin 2.1 (L) 3.5 - 5.0 g/dL   AST 32 15 - 41 U/L   ALT 13 0 - 44 U/L   Alkaline Phosphatase 56 38 - 126 U/L   Total Bilirubin 0.1 (L) 0.3 - 1.2 mg/dL   GFR, Estimated >60 >60 mL/min    Comment: (NOTE) Calculated using the CKD-EPI Creatinine Equation (2021)    Anion gap 8 5 - 15    Comment: Performed at [facility], [place] [address] [place], [place] [address]  Type and screen [place]     Status: None   Collection Time: 05/07/22  2:00 PM  Result Value Ref Range   ABO/RH(D) A POS    Antibody Screen NEG    Sample Expiration      05/10/2022,2359 Performed at [facility], [place] [address], [place], [place] [address]    *Note: Due to a large number of results and/or encounters for the requested time period, some results have not been displayed. A complete set of results can be found in Results Review.  Assessment and Plan :[name] is a 86 y.o. Seen for vaginal/postmenopausal bleeding   At the conclusion of the exam, no active bleeding was noted in the vagina.  There were no tears or lacerations noted.  It is extremely unlikely the bleeding is coming from a gyn source as the patient has no uterus or cervix along with no disruption of the vaginal tissue.  The active bleeding seen escaping from the urethra and the the recent CT scan suggest a possible urological etiology.  Recommend urology consult.   Thank  you for this consult, we will sign off, please call or re-consult with further questions.   For OB/GYN questions, please call the Center for [place] at [place] Monday - Friday, 8 am - 5 pm: [phone] All other times: [phone]    Lynnda Shields, M.D. Attending [place], [facility] for [employer], [place]

## 2022-05-07 NOTE — ED Notes (Signed)
Bladder scaned the patient 650 in bladder.

## 2022-05-08 ENCOUNTER — Inpatient Hospital Stay (HOSPITAL_COMMUNITY): Payer: Medicare Other

## 2022-05-08 DIAGNOSIS — R310 Gross hematuria: Secondary | ICD-10-CM | POA: Diagnosis not present

## 2022-05-08 DIAGNOSIS — R319 Hematuria, unspecified: Secondary | ICD-10-CM

## 2022-05-08 LAB — CBC
HCT: 28.6 % — ABNORMAL LOW (ref 36.0–46.0)
Hemoglobin: 8.9 g/dL — ABNORMAL LOW (ref 12.0–15.0)
MCH: 26.6 pg (ref 26.0–34.0)
MCHC: 31.1 g/dL (ref 30.0–36.0)
MCV: 85.6 fL (ref 80.0–100.0)
Platelets: 262 10*3/uL (ref 150–400)
RBC: 3.34 MIL/uL — ABNORMAL LOW (ref 3.87–5.11)
RDW: 15.1 % (ref 11.5–15.5)
WBC: 9.7 10*3/uL (ref 4.0–10.5)
nRBC: 0 % (ref 0.0–0.2)

## 2022-05-08 LAB — BASIC METABOLIC PANEL
Anion gap: 11 (ref 5–15)
BUN: 11 mg/dL (ref 8–23)
CO2: 23 mmol/L (ref 22–32)
Calcium: 7.9 mg/dL — ABNORMAL LOW (ref 8.9–10.3)
Chloride: 96 mmol/L — ABNORMAL LOW (ref 98–111)
Creatinine, Ser: 0.54 mg/dL (ref 0.44–1.00)
GFR, Estimated: >60 mL/min (ref >60)
Glucose, Bld: 86 mg/dL (ref 70–99)
Potassium: 3.9 mmol/L (ref 3.5–5.1)
Sodium: 130 mmol/L — ABNORMAL LOW (ref 135–145)

## 2022-05-08 LAB — CBG MONITORING, ED
Glucose-Capillary: 75 mg/dL (ref 70–99)
Glucose-Capillary: 70 mg/dL (ref 70–99)
Glucose-Capillary: 77 mg/dL (ref 70–99)

## 2022-05-08 LAB — GLUCOSE, CAPILLARY: Glucose-Capillary: 111 mg/dL — ABNORMAL HIGH (ref 70–99)

## 2022-05-08 MED ORDER — BENZONATATE 100 MG PO CAPS: 100.0000 mg | ORAL_CAPSULE | Freq: Three times a day (TID) | ORAL | Status: DC | PRN

## 2022-05-08 MED ORDER — IOHEXOL 350 MG/ML SOLN
125.0000 mL | Freq: Once | INTRAVENOUS | Status: AC | PRN
  Administered 2022-05-08: 125 mL via INTRAVENOUS

## 2022-05-08 NOTE — ED Notes (Signed)
Provided pt water.  °

## 2022-05-08 NOTE — ED Notes (Signed)
Pt has two O2 sensors on her fingers that are reading in the 70's. RN assessed pt limbs and it is cool to the touch. RN provided pt hand warmer and placed new O2 sensor on right ear. Pt O2 100%

## 2022-05-08 NOTE — ED Notes (Signed)
2 RN's assessed pt bottom. Primary RN didn't see anything hanging from pt rectum. Pt stated it feels like something round the size of her index finger hanging out. RN informed pt she has foley tubing pressed against bottom.

## 2022-05-08 NOTE — ED Notes (Signed)
Pt states she feels like something is hanging out her bottom. RN in will assess with assistance

## 2022-05-08 NOTE — Progress Notes (Signed)
Assessed patients back side. Observed a stage 1 pressure injury.  Patient refused sacral foam dressing. Will inform oncoming nurse to further educate and attempt to place dressing.

## 2022-05-08 NOTE — ED Notes (Signed)
Patient transported to CT 

## 2022-05-08 NOTE — ED Notes (Signed)
This paramedic went into patient room. Patient has a foley in place being irrigated that has not been documented prior to my arrival to patient bedside. Patient currently pain free and requesting water. Patient given a cup of water. Patient given another warm blanket. Patient resting at this time.

## 2022-05-08 NOTE — Progress Notes (Signed)
Patient ID: [name], female   DOB: Feb 14, 1936, 87 y.o.   MRN: [id number]    Subjective: Urine has been clearing overnight.  On minimal CBI drip.  No clots per nursing.    Objective: Vital signs in last 24 hours: Temp:  [98.1 F (36.7 C)-98.9 F (37.2 C)] 98.7 F (37.1 C) (01/01 0845) Pulse Rate:  [82-102] 84 (01/01 1130) Resp:  [18-28] 20 (01/01 1130) BP: (106-126)/(41-66) 109/42 (01/01 1130) SpO2:  [90 %-100 %] 100 % (01/01 1130) Weight:  [54.4 kg] 54.4 kg (12/31 1243)  Intake/Output from previous day: 12/31 0701 - 01/01 0700 In: 749.9 [I.V.:653.4; IV Piggyback:96.4] Out: 2225 [Urine:2225] Intake/Output this shift: No intake/output data recorded.  Physical Exam:  General: Alert and oriented GU: Urine clear but somewhat cloudy on minimal CBI drip  Lab Results: Recent Labs    05/07/22 1246 05/07/22 1833  HGB 9.2* 9.3*  HCT 27.8* 29.0*      Latest Ref Rng & Units 05/07/2022    6:33 PM 05/07/2022   12:46 PM 04/25/2022    4:35 AM  CBC  WBC 4.0 - 10.5 K/uL  11.4  10.1   Hemoglobin 12.0 - 15.0 g/dL 9.3  9.2  13.1   Hematocrit 36.0 - 46.0 % 29.0  27.8  42.5   Platelets 150 - 400 K/uL  304  194      BMET Recent Labs    05/07/22 1315  NA 128*  K 4.3  CL 93*  CO2 27  GLUCOSE 112*  BUN 17  CREATININE 0.75  CALCIUM 8.4*     Studies/Results: CT ABDOMEN PELVIS W WO CONTRAST  Result Date: 05/08/2022 CLINICAL DATA:  Hematuria.  Respiratory distress. EXAM: CT ABDOMEN AND PELVIS WITHOUT AND WITH CONTRAST TECHNIQUE: Multidetector CT imaging of the abdomen and pelvis was performed following the standard protocol before and following the bolus administration of intravenous contrast. RADIATION DOSE REDUCTION: This exam was performed according to the departmental dose-optimization program which includes automated exposure control, adjustment of the mA and/or kV according to patient size and/or use of iterative reconstruction technique. CONTRAST:  121mL OMNIPAQUE  IOHEXOL 350 MG/ML SOLN COMPARISON:  04/04/2022 FINDINGS: Lower chest: Atelectasis and consolidation identified within the left lower lobe. No pleural fluid. Hepatobiliary: No focal liver abnormality is seen. Status post cholecystectomy. No biliary dilatation. Pancreas: Unremarkable. No pancreatic ductal dilatation or surrounding inflammatory changes. Spleen: Normal in size without focal abnormality. Adrenals/Urinary Tract: Normal adrenal glands. No nephrolithiasis identified bilaterally. Bilateral hydronephrosis is identified, right greater than left. Bilateral hydroureter to the level of the urinary bladder is noted. Bilateral striated nephrograms identified concerning for pyelonephritis. No discrete mass. Diminished contrast opacification of the ureters on the delayed images. Marked diffuse circumferential wall thickening of the urinary bladder is identified which is now collapsed around a Foley catheter balloon. Assessment for underlying bladder lesion is diminished due to incomplete distension. There is a defect with along the posterior wall of the urethra with possible fistulous communication with the vagina, image 62/12. Stomach/Bowel: Stomach appears normal. The appendix is suboptimally visualized separate from the right lower quadrant bowel loops. No pathologic dilatation of the large or small bowel loops. No bowel wall thickening or inflammation. Vascular/Lymphatic: Aortic atherosclerosis. No aneurysm. No signs of abdominopelvic adenopathy. Reproductive: Status post hysterectomy. No adnexal masses. Other: No free fluid or fluid collections identified. Musculoskeletal: Fracture deformity involving the superior endplate of Q82 is identified in appears stable from a 04/06/22. Degenerative disc disease identified within the lower lumbar spine. IMPRESSION:  1. Marked diffuse circumferential wall thickening of the urinary bladder which is now collapsed around a Foley catheter balloon. Assessment for underlying  bladder lesion is diminished due to incomplete distension. 2. Bilateral hydronephrosis and hydroureter to the level of the urinary bladder. Findings may reflect sequelae of chronic bladder outlet obstruction. 3. Bilateral striated nephrograms concerning for pyelonephritis. 4. There is a defect with along the posterior wall of the urethra with possible fistulous communication with the vagina. Clinical correlation advised. 5. Atelectasis and consolidation within the left lower lobe. New from 04/04/2022 6. Stable T12 compression fracture. 7.  Aortic Atherosclerosis (ICD10-I70.0). Electronically Signed   By: Kerby Moors M.D.   On: 05/08/2022 11:02    Assessment/Plan: 1) Hematuria: Resolving.  Will titrate CBI off.  Continue catheter for now and recheck H/H.  CT demonstrated bilateral hydronephrosis which may be due to the fact she was in retention when she arrived yesterday vs chronic reflux.  Renal function is normal and no intervention is required at this time.  Her bladder demonstrates diffuse bladder wall thickening which may be related to infection although she will need outpatient cystoscopy to complete her evaluation and rule out bladder malignancy.  Continue antibiotic therapy for UTI.  UA looked infected although unclear if urine culture has been sent.   LOS: 1 day   Dutch Gray 05/08/2022, 12:13 PM

## 2022-05-08 NOTE — Progress Notes (Signed)
VASCULAR LAB    Bilateral lower extremity venous duplex has been performed.  See CV proc for preliminary results.   Laquasia Pincus, RVT 05/08/2022, 12:22 PM

## 2022-05-08 NOTE — ED Notes (Signed)
Pt requested some coughing medication. RN notified MD

## 2022-05-08 NOTE — Progress Notes (Signed)
PROGRESS NOTE    [name]  UYQ:[id number] DOB: 1935-12-06 DOA: 05/07/2022 PCP: [name], MD  Chief Complaint  Patient presents with   Vaginal Bleeding    Brief Narrative:  [name] is [name] 87 y.o. female with medical history significant of complete heart block  s/p permanent pacemaker, chronic respiratory failure on 2-3 L of oxygen, DM type II, DVT on chronic anticoagulation, frequent UTIs, chronic left foot osteomyelitis who presents with reports of large amounts of what was initially thought to be vaginal bleeding over the last 3 days.  History is limited from patient as she seems to intermittently be confused and drifts off.  She did complain of pain in the lower abdomen.   She's been admitted for gross hematuria, anemia, and mild encephalopathy.  Assessment & Plan:   Principal Problem:   Hematuria Active Problems:   ABLA (acute blood loss anemia)   Acute metabolic encephalopathy   Leukocytosis   Bilateral hydronephrosis   History of DVT (deep vein thrombosis)   Chronic anticoagulation   Hypertension   COPD mixed type with chronic respiratory failure    Chronic respiratory failure with hypoxia (HCC)   Uncontrolled type 2 diabetes mellitus with hyperglycemia (HCC)   Chronic osteomyelitis of left foot (HCC)   Complete heart block (HCC)   Pacemaker  Acute blood loss anemia secondary to hematuria Patient presents with complaints of bleeding.  Hemoglobin 9.2, but previously been 13.1 on 12/19. Initially thought to be vaginal, but seem more likely bladder after being evaluated with speculum exam by OB/GYN .  CT scan of the abdomen pelvis had been ordered.  Urology evaluated and placed Foley catheter producing large clots of blood and she was placed on continuous bladder irrigation.  Patient had already been typed and screened. -Admit to [facility] progressive bed -Monitor intake and output -Check urinalysis and urine culture -Hold Eliquis -Serial monitoring of H&H.   Transfuse blood products as needed for blood counts less than 8 g/dL.   -Continue Foley catheter with bladder irrigation -Urology consulted, will follow-up for any further recommendations -pending CT scan, will need cystoscopy outpatient   Acute metabolic encephalopathy Patient noted to intermittently be confused and altered.  Suspect secondary to infection. -mild confusion this morning - follow with family for collateral for baseline -Delirium precautions -Neurochecks   Leukocytosis Acute.  WBC elevated 11.4.  Suspect urinary tract infection as the likely cause of symptoms. -Follow-up urinalysis/culture (both pending) -Start empiric antibiotics with Rocephin IV   Bilateral hydronephrosis Noted on prior scans of the abdomen pelvis.  Thought to be likely obstructive due to clotted blood in the bladder. -Continue Foley catheter - per urology   History of DVT on chronic anticoagulation Patient has been found to have [name] DVT of the left lower extremity back in 01/2022.  Patient had been on Eliquis for treatment. -Hold Eliquis due to bleeding acute setting -Check Doppler ultrasound of the lower extremities for resolution of DV, pretty close to [name] 3 month course, will f/u US and see   Essential hypertension Blood pressures have been stable at this time 118/53-123/66. -Hold home blood pressure medications in the setting of acute bleeding   COPD, without acute exacerbation  Chronic respiratory failure with hypoxia Chronic. Home O2 of 2-3 L/min.  No wheezing noted on physical exam at this time. -Continue 2 to 3 L of nasal cannula oxygen at this time -Continue home breathing treatments   Uncontrolled type 2 diabetes mellitus with hyperglycemia (HCC) Last hemoglobin  A1c 12.9 on 11/29.   -Hypoglycemic protocols -CBGs before every meal with moderate SSI -Resume Lantus 18 units daily once tolerating p.o.   Chronic osteomyelitis of the left foot Patient recommended to be on Augmentin twice  daily and has been followed in the outpatient setting by infectious disease and wound care.  Previously refused surgery. -Hold Augmentin (currently on ceftriaxone)   T12 vertebral fracture Noted during last hospitalization on 11/28.  Patient had been in rehab.   Complete heart block s/p permanent pacemaker     DVT prophylaxis: TED hose Code Status: DNR Family Communication: full  Disposition:   Status is: Inpatient Remains inpatient appropriate because: continued need for care with urology   Consultants:  urology  Procedures:  none  Antimicrobials:  Anti-infectives (From admission, onward)    Start     Dose/Rate Route Frequency Ordered Stop   05/07/22 1845  cefTRIAXone (ROCEPHIN) 1 g in sodium chloride 0.9 % 100 mL IVPB        1 g 200 mL/hr over 30 Minutes Intravenous Every 24 hours 05/07/22 1838         Subjective: No complaints [name] little confused about where she is  Objective: Vitals:   05/08/22 0749 05/08/22 0815 05/08/22 0845 05/08/22 0904  BP:  (!) 106/48 (!) 115/48   Pulse: 90 82 88 87  Resp: (!) 28 (!) 25 (!) 26 19  Temp:      TempSrc:      SpO2: 96% 90% 90% 90%  Weight:      Height:        Intake/Output Summary (Last 24 hours) at 05/08/2022 0952 Last data filed at 05/08/2022 0554 Gross per 24 hour  Intake 749.85 ml  Output 2225 ml  Net -1475.15 ml   Filed Weights   05/07/22 1243  Weight: 54.4 kg    Examination:  General exam: Appears calm and comfortable  Respiratory system: unlabored Cardiovascular system: RRR Gastrointestinal system: Abdomen is nondistended, soft and nontender Central nervous system: Alert. No focal neurological deficits. Extremities: no LEE  Data Reviewed: I have personally reviewed following labs and imaging studies  CBC: Recent Labs  Lab 05/07/22 1246 05/07/22 1833  WBC 11.4*  --   NEUTROABS 8.6*  --   HGB 9.2* 9.3*  HCT 27.8* 29.0*  MCV 82.5  --   PLT 304  --     Basic Metabolic Panel: Recent Labs  Lab  05/07/22 1315  NA 128*  K 4.3  CL 93*  CO2 27  GLUCOSE 112*  BUN 17  CREATININE 0.75  CALCIUM 8.4*    GFR: Estimated Creatinine Clearance: 43.3 mL/min (by C-G formula based on SCr of 0.75 mg/dL).  Liver Function Tests: Recent Labs  Lab 05/07/22 1315  AST 32  ALT 13  ALKPHOS 56  BILITOT 0.1*  PROT 6.2*  ALBUMIN 2.1*    CBG: Recent Labs  Lab 05/07/22 1706 05/08/22 0818  GLUCAP 94 75     No results found for this or any previous visit (from the past 240 hour(s)).       Radiology Studies: No results found.      Scheduled Meds:  sodium chloride   Intravenous Once   arformoterol  15 mcg Nebulization BID   And   umeclidinium bromide  1 puff Inhalation Daily   insulin aspart  0-15 Units Subcutaneous TID WC   sodium chloride flush  3 mL Intravenous Q12H   Continuous Infusions:  sodium chloride 75 mL/hr at 05/08/22 0554  cefTRIAXone (ROCEPHIN)  IV Stopped (05/07/22 2130)   sodium chloride irrigation       LOS: 1 day    Time spent: over 30 min    Fayrene Helper, MD [facility]   To contact the attending provider between 7A-7P or the covering provider during after hours 7P-7A, please log into the web site [email or web address] and access using universal [place] password for that web site. If you do not have the password, please call the hospital operator.  05/08/2022, 9:52 AM

## 2022-05-09 DIAGNOSIS — R310 Gross hematuria: Secondary | ICD-10-CM | POA: Diagnosis not present

## 2022-05-09 LAB — COMPREHENSIVE METABOLIC PANEL
ALT: 12 U/L (ref 0–44)
AST: 24 U/L (ref 15–41)
Albumin: 1.9 g/dL — ABNORMAL LOW (ref 3.5–5.0)
Alkaline Phosphatase: 49 U/L (ref 38–126)
Anion gap: 9 (ref 5–15)
BUN: 7 mg/dL — ABNORMAL LOW (ref 8–23)
CO2: 25 mmol/L (ref 22–32)
Calcium: 8 mg/dL — ABNORMAL LOW (ref 8.9–10.3)
Chloride: 98 mmol/L (ref 98–111)
Creatinine, Ser: 0.5 mg/dL (ref 0.44–1.00)
GFR, Estimated: >60 mL/min (ref >60)
Glucose, Bld: 93 mg/dL (ref 70–99)
Potassium: 4.1 mmol/L (ref 3.5–5.1)
Sodium: 132 mmol/L — ABNORMAL LOW (ref 135–145)
Total Bilirubin: 0.4 mg/dL (ref 0.3–1.2)
Total Protein: 5.6 g/dL — ABNORMAL LOW (ref 6.5–8.1)

## 2022-05-09 LAB — GLUCOSE, CAPILLARY
Glucose-Capillary: 57 mg/dL — ABNORMAL LOW (ref 70–99)
Glucose-Capillary: 88 mg/dL (ref 70–99)
Glucose-Capillary: 118 mg/dL — ABNORMAL HIGH (ref 70–99)
Glucose-Capillary: 133 mg/dL — ABNORMAL HIGH (ref 70–99)
Glucose-Capillary: 160 mg/dL — ABNORMAL HIGH (ref 70–99)

## 2022-05-09 LAB — URINALYSIS, ROUTINE W REFLEX MICROSCOPIC
Bilirubin Urine: NEGATIVE
Glucose, UA: NEGATIVE mg/dL
Ketones, ur: NEGATIVE mg/dL
Nitrite: NEGATIVE
Protein, ur: NEGATIVE mg/dL
RBC / HPF: >50 RBC/hpf — ABNORMAL HIGH (ref 0–5)
Specific Gravity, Urine: 1.006 (ref 1.005–1.030)
WBC, UA: >50 WBC/hpf — ABNORMAL HIGH (ref 0–5)
pH: 5 (ref 5.0–8.0)

## 2022-05-09 LAB — PHOSPHORUS: Phosphorus: 2.3 mg/dL — ABNORMAL LOW (ref 2.5–4.6)

## 2022-05-09 LAB — CBC WITH DIFFERENTIAL/PLATELET
Abs Immature Granulocytes: 0.15 10*3/uL — ABNORMAL HIGH (ref 0.00–0.07)
Basophils Absolute: 0 10*3/uL (ref 0.0–0.1)
Basophils Relative: 0 %
Eosinophils Absolute: 0.1 10*3/uL (ref 0.0–0.5)
Eosinophils Relative: 1 %
HCT: 26.6 % — ABNORMAL LOW (ref 36.0–46.0)
Hemoglobin: 8.4 g/dL — ABNORMAL LOW (ref 12.0–15.0)
Immature Granulocytes: 2 %
Lymphocytes Relative: 20 %
Lymphs Abs: 1.8 10*3/uL (ref 0.7–4.0)
MCH: 26.9 pg (ref 26.0–34.0)
MCHC: 31.6 g/dL (ref 30.0–36.0)
MCV: 85.3 fL (ref 80.0–100.0)
Monocytes Absolute: 0.5 10*3/uL (ref 0.1–1.0)
Monocytes Relative: 6 %
Neutro Abs: 6.4 10*3/uL (ref 1.7–7.7)
Neutrophils Relative %: 71 %
Platelets: 267 10*3/uL (ref 150–400)
RBC: 3.12 MIL/uL — ABNORMAL LOW (ref 3.87–5.11)
RDW: 15.1 % (ref 11.5–15.5)
WBC: 9 10*3/uL (ref 4.0–10.5)
nRBC: 0 % (ref 0.0–0.2)

## 2022-05-09 LAB — MAGNESIUM: Magnesium: 1.5 mg/dL — ABNORMAL LOW (ref 1.7–2.4)

## 2022-05-09 MED ORDER — SODIUM CHLORIDE 0.9 % IV SOLN
1.0000 g | Freq: Two times a day (BID) | INTRAVENOUS | Status: AC
  Administered 2022-05-09: 1 g via INTRAVENOUS
  Filled 2022-05-09: qty 10

## 2022-05-09 MED ORDER — INSULIN ASPART 100 UNIT/ML IJ SOLN
0.0000 [IU] | Freq: Three times a day (TID) | INTRAMUSCULAR | Status: DC
  Administered 2022-05-10: 1 [IU] via SUBCUTANEOUS
  Administered 2022-05-10 – 2022-05-11 (×3): 2 [IU] via SUBCUTANEOUS
  Administered 2022-05-11 (×2): 3 [IU] via SUBCUTANEOUS

## 2022-05-09 MED ORDER — AMOXICILLIN-POT CLAVULANATE 875-125 MG PO TABS
1.0000 | ORAL_TABLET | Freq: Two times a day (BID) | ORAL | Status: DC
  Administered 2022-05-10 – 2022-05-11 (×3): 1 via ORAL
  Filled 2022-05-09 (×3): qty 1

## 2022-05-09 MED ORDER — MAGNESIUM SULFATE 4 GM/100ML IV SOLN
4.0000 g | Freq: Once | INTRAVENOUS | Status: AC
  Administered 2022-05-09: 4 g via INTRAVENOUS
  Filled 2022-05-09: qty 100

## 2022-05-09 MED ORDER — SODIUM CHLORIDE 0.9 % IV SOLN
3.0000 g | Freq: Three times a day (TID) | INTRAVENOUS | Status: DC
  Filled 2022-05-09: qty 8

## 2022-05-09 NOTE — Progress Notes (Signed)
Hypoglycemic Event  CBG: 57  Treatment: 8 oz juice/soda  Symptoms: Shaky  Follow-up CBG: [id number] CBG Result: 88  Possible Reasons for Event: Unknown    Manuella Ghazi

## 2022-05-09 NOTE — Evaluation (Signed)
Occupational Therapy Evaluation Patient Details Name: [name] MRN: [id number] DOB: [birth year] Today's Date: 05/09/2022   History of Present Illness 87 y.o. female presents to [facility] hospital on 05/07/2022 with hematuria and intermittent confusion. PMH includes stroke, COPD (home O2), HLD, DM2, spinal stenosis, CHB s/p pacemaker, HTN, cognitive impairment, anxiety.   Clinical Impression   Patient admitted for the diagnosis above.  PTA she lives alone in a ground level apartment, and has PCA assist with community mobility, ADL and iADL.  Patient has has numerous falls at home with admissions to [facility] and [facility].  Patient presents with the deficits listed below, and is currently needing up to Mod A for basic bed mobility, and Max A for lower body ADL.   Patient is unwilling to progress beyond the bed this date, but agrees to attempt out of bed to the recliner tomorrow morning.  OT will follow in the acute setting to maximize her functional status, but she would benefit from a SNF stay post acute for rehab.  Ultimately, the patient would benefit from increased assist at an ALF, it is doubtful she could arrange the closer to 24 hour PCA care she needs.       Recommendations for follow up therapy are one component of a multi-disciplinary discharge planning process, led by the attending physician.  Recommendations may be updated based on patient status, additional functional criteria and insurance authorization.   Follow Up Recommendations  Skilled nursing-short term rehab (<3 hours/day)     Assistance Recommended at Discharge Frequent or constant Supervision/Assistance  Patient can return home with the following A lot of help with bathing/dressing/bathroom;Assistance with cooking/housework;Direct supervision/assist for financial management;Assist for transportation;Help with stairs or ramp for entrance;Direct supervision/assist for medications management;A lot of help with walking and/or transfers     Functional Status Assessment  Patient has had a recent decline in their functional status and demonstrates the ability to make significant improvements in function in a reasonable and predictable amount of time.  Equipment Recommendations       Recommendations for Other Services       Precautions / Restrictions Precautions Precautions: Fall;Back Precaution Booklet Issued: No Precaution Comments: Recent fall with compression fracture Restrictions Weight Bearing Restrictions: No Other Position/Activity Restrictions: chronic neck and R shoulder pain      Mobility Bed Mobility Overal bed mobility: Needs Assistance Bed Mobility: Sidelying to Sit, Rolling Rolling: Min assist Sidelying to sit: Mod assist       General bed mobility comments: patient deferred coming to a full sit this date due to upset stomach.    Transfers                          Balance                                           ADL either performed or assessed with clinical judgement   ADL Overall ADL's : Needs assistance/impaired Eating/Feeding: Set up;Bed level   Grooming: Set up;Supervision/safety;Bed level           Upper Body Dressing : Moderate assistance;Bed level   Lower Body Dressing: Maximal assistance;Bed level                       Vision Patient Visual Report: No change from baseline  Perception     Praxis      Pertinent Vitals/Pain Pain Assessment Pain Assessment: Faces Faces Pain Scale: Hurts little more Pain Location: back and stomach Pain Descriptors / Indicators: Aching, Sore Pain Intervention(s): Limited activity within patient's tolerance     Hand Dominance Right   Extremity/Trunk Assessment Upper Extremity Assessment Upper Extremity Assessment: Generalized weakness;RUE deficits/detail RUE: Shoulder pain with ROM RUE Sensation: WNL RUE Coordination: WNL   Lower Extremity Assessment Lower Extremity Assessment:  Defer to PT evaluation   Cervical / Trunk Assessment Cervical / Trunk Assessment: Kyphotic   Communication Communication Communication: No difficulties   Cognition Arousal/Alertness: Awake/alert Behavior During Therapy: WFL for tasks assessed/performed                     Orientation Level: Disoriented to, Time, Situation   Memory: Decreased recall of precautions, Decreased short-term memory       Problem Solving: Decreased initiation, Requires tactile cues, Requires verbal cues General Comments: pt has no recall of reason for admission, no recall of bladder irrigation ongoing.  Does not remember how she got to the hospital.     General Comments   VSS on RA    Exercises     Shoulder Instructions      Home Living Family/patient expects to be discharged to:: Private residence Living Arrangements: Alone Available Help at Discharge: Personal care attendant;Available PRN/intermittently Type of Home: Apartment Home Access: Stairs to enter Entrance Stairs-Number of Steps: 2 Entrance Stairs-Rails: Can reach both;Right;Left Home Layout: One level     Bathroom Shower/Tub: Chief Strategy Officer: Standard Bathroom Accessibility: No   Home Equipment: Rollator (4 wheels);Rolling Walker (2 wheels);Shower seat   Additional Comments: likely needs more frequent assistance due to cognitive deficits and frequent falls      Prior Functioning/Environment Prior Level of Function : Needs assist       Physical Assist : ADLs (physical)   ADLs (physical): Bathing;Dressing;IADLs   ADLs Comments: pt reports her aide assists with IADLs, generally there when she showers, but does state she will attempt showers without supervision.  Generally does not cook much.  States PCA sets medications up, but she can still do that.        OT Problem List: Decreased activity tolerance;Decreased knowledge of use of DME or AE;Decreased safety awareness;Decreased  cognition;Impaired balance (sitting and/or standing);Pain;Cardiopulmonary status limiting activity;Decreased knowledge of precautions      OT Treatment/Interventions: Self-care/ADL training;Therapeutic exercise;DME and/or AE instruction;Therapeutic activities;Cognitive remediation/compensation;Patient/family education;Balance training    OT Goals(Current goals can be found in the care plan section) Acute Rehab OT Goals Patient Stated Goal: Going home tomorrow OT Goal Formulation: With patient Time For Goal Achievement: 05/23/22 Potential to Achieve Goals: Fair ADL Goals Pt Will Perform Grooming: with set-up;sitting Pt Will Perform Lower Body Dressing: sit to/from stand;with min assist Pt Will Transfer to Toilet: with min assist;ambulating;regular height toilet  OT Frequency: Min 2X/week    Co-evaluation              AM-PAC OT "6 Clicks" Daily Activity     Outcome Measure Help from another person eating meals?: None Help from another person taking care of personal grooming?: None Help from another person toileting, which includes using toliet, bedpan, or urinal?: A Lot Help from another person bathing (including washing, rinsing, drying)?: A Lot Help from another person to put on and taking off regular upper body clothing?: A Lot Help from another person to put on and taking  off regular lower body clothing?: A Lot 6 Click Score: 16   End of Session Equipment Utilized During Treatment: Rolling walker (2 wheels) Nurse Communication: Other (comment) (IV infusion complete)  Activity Tolerance: Patient limited by pain Patient left: in bed;with bed alarm set;with call bell/phone within reach  OT Visit Diagnosis: Muscle weakness (generalized) (M62.81);Other symptoms and signs involving cognitive function;Pain;History of falling (Z91.81);Unsteadiness on feet (R26.81) Pain - Right/Left: Right Pain - part of body: Shoulder                Time: 8341-9622 OT Time Calculation (min): 19  min Charges:  OT General Charges $OT Visit: 1 Visit OT Evaluation $OT Eval Moderate Complexity: 1 Mod  05/09/2022  RP, OTR/L  Acute Rehabilitation Services  Office:  [phone]   Metta Clines 05/09/2022, 5:16 PM

## 2022-05-09 NOTE — Evaluation (Signed)
Physical Therapy Evaluation Patient Details Name: [name] MRN: [id number] DOB: December 17, 1935 Today's Date: 05/09/2022  History of Present Illness  87 y.o. female presents to [facility] hospital on 05/07/2022 with hematuria and intermittent confusion. PMH includes stroke, COPD (home O2), HLD, DM2, spinal stenosis, CHB s/p pacemaker, HTN, cognitive impairment, anxiety.  Clinical Impression  Pt presents to PT with deficits in activity tolerance, functional mobility, strength, balance, power, endurance. Pt has a significant fear of falling and presents with very poor memory and safety awareness. Pt reports multiple falls recently and is limited by a fear of falling this session. Pt requires significant assistance to perform bed mobility at this time. PT recommends SNF placement, if the pt refuses SNF she will need 24/7 supervision due to impaired cognition and high falls risk.     Recommendations for follow up therapy are one component of a multi-disciplinary discharge planning process, led by the attending physician.  Recommendations may be updated based on patient status, additional functional criteria and insurance authorization.  Follow Up Recommendations Skilled nursing-short term rehab (<3 hours/day) Can patient physically be transported by private vehicle: No    Assistance Recommended at Discharge Frequent or constant Supervision/Assistance  Patient can return home with the following  A lot of help with walking and/or transfers;A lot of help with bathing/dressing/bathroom;Assistance with cooking/housework;Direct supervision/assist for medications management;Direct supervision/assist for financial management;Assist for transportation;Help with stairs or ramp for entrance    Equipment Recommendations Wheelchair (measurements PT);Wheelchair cushion (measurements PT);Hospital bed  Recommendations for Other Services       Functional Status Assessment Patient has had a recent decline in their  functional status and demonstrates the ability to make significant improvements in function in a reasonable and predictable amount of time.     Precautions / Restrictions Precautions Precautions: Fall;Back Restrictions Weight Bearing Restrictions: No      Mobility  Bed Mobility Overal bed mobility: Needs Assistance Bed Mobility: Supine to Sit, Sit to Supine     Supine to sit: Mod assist, HOB elevated Sit to supine: Mod assist   General bed mobility comments: pt performs supine to sit twice, quickly returning to lying after first attempt due to fear of falling.    Transfers Overall transfer level:  (pt declines attempts 2/2 fear of falling and back pain)                      Ambulation/Gait                  Stairs            Wheelchair Mobility    Modified Rankin (Stroke Patients Only)       Balance Overall balance assessment: Needs assistance Sitting-balance support: Single extremity supported, Feet supported Sitting balance-Leahy Scale: Poor Sitting balance - Comments: minA, posterior lean Postural control: Posterior lean                                   Pertinent Vitals/Pain Pain Assessment Pain Assessment: Faces Faces Pain Scale: Hurts even more Pain Location: back Pain Descriptors / Indicators: Aching Pain Intervention(s): Monitored during session    Home Living Family/patient expects to be discharged to:: Private residence Living Arrangements: Alone Available Help at Discharge: Personal care attendant (PCA monday-friday 9am-3PM per prior admission. Pt reports her aide comes daily, but only sometimes on weekends, otherwise she is unable to provide further detail) Type of Home:  Apartment Home Access: Stairs to enter Entrance Stairs-Rails: Can reach both Entrance Stairs-Number of Steps: 2   Home Layout: One level Home Equipment: Rollator (4 wheels)      Prior Function Prior Level of Function : Needs assist              Mobility Comments: pt reporta ambulating within the home with a rollator, reports not using any devices for ambulation out of the home. Reports many recent falls ADLs Comments: pt reports her aide assists with IADLs     Hand Dominance   Dominant Hand: Right    Extremity/Trunk Assessment   Upper Extremity Assessment Upper Extremity Assessment: Generalized weakness    Lower Extremity Assessment Lower Extremity Assessment: Generalized weakness    Cervical / Trunk Assessment Cervical / Trunk Assessment: Kyphotic  Communication   Communication: No difficulties  Cognition   Behavior During Therapy: Anxious Overall Cognitive Status: No family/caregiver present to determine baseline cognitive functioning Area of Impairment: Orientation, Attention, Memory, Following commands, Safety/judgement, Awareness, Problem solving                 Orientation Level: Disoriented to, Time, Situation Current Attention Level: Sustained Memory: Decreased recall of precautions, Decreased short-term memory Following Commands: Follows one step commands with increased time Safety/Judgement: Decreased awareness of safety, Decreased awareness of deficits Awareness: Intellectual Problem Solving: Decreased initiation, Requires tactile cues, Requires verbal cues General Comments: pt has no recall of reason for admission, no recall of bladder irrigation ongoing        General Comments General comments (skin integrity, edema, etc.): VSS, pt on [facility]    Exercises     Assessment/Plan    PT Assessment Patient needs continued PT services  PT Problem List Decreased strength;Decreased activity tolerance;Decreased balance;Decreased mobility;Decreased cognition;Decreased knowledge of use of DME;Decreased safety awareness;Decreased knowledge of precautions;Cardiopulmonary status limiting activity;Pain       PT Treatment Interventions DME instruction;Gait training;Functional mobility  training;Therapeutic activities;Therapeutic exercise;Balance training;Neuromuscular re-education;Cognitive remediation;Patient/family education;Wheelchair mobility training    PT Goals (Current goals can be found in the Care Plan section)  Acute Rehab PT Goals Patient Stated Goal: to return home PT Goal Formulation: With patient Time For Goal Achievement: 05/23/22 Potential to Achieve Goals: Poor    Frequency Min 2X/week     Co-evaluation               AM-PAC PT "6 Clicks" Mobility  Outcome Measure Help needed turning from your back to your side while in a flat bed without using bedrails?: A Lot Help needed moving from lying on your back to sitting on the side of a flat bed without using bedrails?: A Lot Help needed moving to and from a bed to a chair (including a wheelchair)?: Total Help needed standing up from a chair using your arms (e.g., wheelchair or bedside chair)?: Total Help needed to walk in hospital room?: Total Help needed climbing 3-5 steps with a railing? : Total 6 Click Score: 8    End of Session   Activity Tolerance: Patient limited by pain Patient left: in bed;with call bell/phone within reach;with bed alarm set Nurse Communication: Mobility status PT Visit Diagnosis: Other abnormalities of gait and mobility (R26.89);Muscle weakness (generalized) (M62.81);History of falling (Z91.81);Pain Pain - part of body:  (back)    Time: 8119-1478 PT Time Calculation (min) (ACUTE ONLY): 20 min   Charges:   PT Evaluation $PT Eval Low Complexity: 1 Low          Arlyss Gandy, PT,  DPT Acute Rehabilitation Office [phone]   Zenaida Niece 05/09/2022, 11:41 AM

## 2022-05-09 NOTE — Progress Notes (Addendum)
Pharmacy Antibiotic Note  [name] is a 87 y.o. female admitted on 05/07/2022 with  UTI, hx polymicrobial bacteremia, osteo .  Pharmacy has been consulted for Unasyn dosing.  Plan: Unasyn 3gm IV q8h Will f/u renal function, micro data, and pt's clinical condition  ADDENDUM (1730) - Unasyn not started - continued on Rocephin  Height: 5\' 3"  (160 cm) Weight: 54.4 kg (120 lb) IBW/kg (Calculated) : 52.4  Temp (24hrs), Avg:98.3 F (36.8 C), Min:98 F (36.7 C), Max:98.5 F (36.9 C)  Recent Labs  Lab 05/07/22 1246 05/07/22 1315 05/08/22 1247 05/09/22 0427  WBC 11.4*  --  9.7 9.0  CREATININE  --  0.75 0.54 0.50    Estimated Creatinine Clearance: 43.3 mL/min (by C-G formula based on SCr of 0.5 mg/dL).    Allergies  Allergen Reactions   Chlordiazepoxide-Clidinium Other (See Comments)    Sleepy,weak- "Allergic," per MAR   Biaxin [Clarithromycin] Diarrhea and Other (See Comments)    Foul taste, abd pain, diarrhea- "Allergic," per MAR   Chlordiazepoxide Other (See Comments)    "Allergic," per MAR   Tape Other (See Comments)    SKIN IS VERY THIN AND WILL TEAR AND BRUISE EASILY!! "Allergic," per MAR   Ultram [Tramadol] Other (See Comments)    Caused tremors   Codeine Nausea Only and Other (See Comments)    Upset the stomach- "Allergic," per MAR    Antimicrobials this admission: 12/31 Ceftriaxone >> 1/2 1/2 Unasyn >>   Microbiology results: UCx:  Thank you for allowing pharmacy to be a part of this patient's care.  Sherlon Handing, PharmD, BCPS Please see amion for complete clinical pharmacist phone list 05/09/2022 5:19 PM

## 2022-05-09 NOTE — Progress Notes (Signed)
Patient ID: [name], female   DOB: 1936-03-06, 87 y.o.   MRN: [id number]    Subjective: Pt resting comfortably.   Objective: Vital signs in last 24 hours: Temp:  [98 F (36.7 C)-98.7 F (37.1 C)] 98.2 F (36.8 C) (01/02 0343) Pulse Rate:  [76-99] 78 (01/02 0343) Resp:  [19-28] 22 (01/02 0343) BP: (103-127)/(42-69) 116/43 (01/02 0343) SpO2:  [89 %-100 %] 99 % (01/02 0343)  Intake/Output from previous day: 01/01 0701 - 01/02 0700 In: 1979.8 [P.O.:360; I.V.:1519.8; IV Piggyback:100] Out: 5100 [Urine:5100] Intake/Output this shift: No intake/output data recorded.  Physical Exam:  General: Alert and oriented GU: Urine light pink to clear with minimal CBI, mildly cloudy  Lab Results: Recent Labs    05/07/22 1833 05/08/22 1247 05/09/22 0427  HGB 9.3* 8.9* 8.4*  HCT 29.0* 28.6* 26.6*      Latest Ref Rng & Units 05/09/2022    4:27 AM 05/08/2022   12:47 PM 05/07/2022    6:33 PM  CBC  WBC 4.0 - 10.5 K/uL 9.0  9.7    Hemoglobin 12.0 - 15.0 g/dL 8.4  8.9  9.3   Hematocrit 36.0 - 46.0 % 26.6  28.6  29.0   Platelets 150 - 400 K/uL 267  262       BMET Recent Labs    05/08/22 1247 05/09/22 0427  NA 130* 132*  K 3.9 4.1  CL 96* 98  CO2 23 25  GLUCOSE 86 93  BUN 11 7*  CREATININE 0.54 0.50  CALCIUM 7.9* 8.0*     Studies/Results:   Assessment/Plan: 1) Hematuria:  This is very mild and minimal at this point.  Continue to wean CBI off today.  Do not suspect ongoing or active bleeding now.  Urine culture was unfortunately not sent with initial UA and is still pending to be collected by nursing.  Likely will not be helpful now considering 2 days of antibiotic therapy already.  Suspect infection to be a possible source of the hematuria and should continue a full 10 day course (due to hydronephrosis and possibility of pyelonephritis) of empiric or culture directed therapy (if culture grows something out).  Continue to hold Eliquis for now.  Will continue to follow with  plans for outpatient cystoscopy at some point after hospital discharge.   LOS: 2 days   Dutch Gray 05/09/2022, 7:46 AM

## 2022-05-09 NOTE — Progress Notes (Signed)
PROGRESS NOTE    [name]  OPF:[id number] DOB: 09-20-1935 DOA: 05/07/2022 PCP: [name] Kuster, MD  Chief Complaint  Patient presents with   Vaginal Bleeding    Brief Narrative:  [name] is [name] 87 y.o. female with medical history significant of complete heart block  s/p permanent pacemaker, chronic respiratory failure on 2-3 L of oxygen, DM type II, DVT on chronic anticoagulation, frequent UTIs, chronic left foot osteomyelitis who presents with reports of large amounts of what was initially thought to be vaginal bleeding over the last 3 days.  History is limited from patient as she seems to intermittently be confused and drifts off.  She did complain of pain in the lower abdomen.   She's been admitted for gross hematuria, anemia, and mild encephalopathy.  Urology following.  On abx for UTI.  Assessment & Plan:   Principal Problem:   Hematuria Active Problems:   ABLA (acute blood loss anemia)   Acute metabolic encephalopathy   Leukocytosis   Bilateral hydronephrosis   History of DVT (deep vein thrombosis)   Chronic anticoagulation   Hypertension   COPD mixed type with chronic respiratory failure    Chronic respiratory failure with hypoxia (HCC)   Uncontrolled type 2 diabetes mellitus with hyperglycemia (HCC)   Chronic osteomyelitis of left foot (HCC)   Complete heart block [facility])   Pacemaker  Disposition Therapy recommending SNF (24 hour care), but she wants to go home.  Apparently she has caregiver [name]).  She does not have 24 hour care.  [name] there large stretches of day, but doesn't sound like anyone home overnight and sometimes during the day.  Not clear to me who helps her make medical decisions.  At last hospital stay, she did not demonstrate capacity to make medical decisions based on psychiatry evaluation (12/21 discharge summary).  The d/c summary notes APS assumed guardianship?  I asked [name] to help [place] get brother's contact information.  I  consulted palliative to help [place] establish goals of care and medical decision makers.  Acute blood loss anemia secondary to hematuria Patient presents with complaints of bleeding -Hb relatively stable at this time -Check urinalysis and urine culture - pending -Hold Eliquis -Serial monitoring of H&H.  Transfuse blood products as needed for blood counts less than 8 g/dL.   -Continue Foley catheter with bladder irrigation (weaning to off per urology) -Urology consulted, will follow-up for any further recommendations - holding eliquis -CT abd/pelvis with diffuse circumferential wall thickeing of the urinary bladder, bilateral hydro and hydroureter, bilateral striated nephrograms, possible fistulous communication with vagina -will need cystoscopy outpatient   Acute metabolic encephalopathy Patient noted to intermittently be confused and altered.  Suspect secondary to infection. -mild confusion this morning - follow with family for collateral for baseline -Delirium precautions -Neurochecks   Leukocytosis  Urinary Tract Infection, concern for Pyelo Acute.  WBC elevated 11.4.  Suspect urinary tract infection as the likely cause of symptoms. -Follow-up urinalysis/culture (both pending) -Start empiric antibiotics with Rocephin IV (discussed with ID based on chronic osteo and hx polymicrobial bacteremia as well as limited culture data with suspected UTI -> they recommended 3 doses of ceftriaxone, then resume augmentin.  Will continue to follow culture data, though this was collected after 2 doses of abx, so not sure how helpful this will be).   Bilateral hydronephrosis Noted on prior scans of the abdomen pelvis.  Thought to be likely obstructive due to clotted blood in the bladder. -Continue Foley catheter -  per urology   Consolidation within Left Lower Lobe - seen on CT, seems at baseline O2 - follow CXR outpatient - w/u additionally prn  History of DVT on chronic anticoagulation Patient has  been found to have [name] DVT of the left lower extremity back in 01/2022.  Patient had been on Eliquis for treatment. -Hold Eliquis due to bleeding acute setting -[place] of LLE shows resolution of previously noted thrombus.  As she's been on anticoagulation almost 3 months and had resolution of DVT, will continue to hold anticoagulation at this time.  Urology continuing to recommend holding abx.  Will hold anticoagulation for now.  Would be reasonable to d/c given almost 3 months therapy (I only see hx of this one DVT - would ensure no history of prior DVT's before committing to this plan).   Essential hypertension Blood pressures have been stable at this time 118/53-123/66. -Hold home blood pressure medications in the setting of acute bleeding   COPD, without acute exacerbation  Chronic respiratory failure with hypoxia Chronic. Home O2 of 2-3 L/min.  No wheezing noted on physical exam at this time. -Continue 2 to 3 L of nasal cannula oxygen at this time -Continue home breathing treatments   Uncontrolled type 2 diabetes mellitus with hyperglycemia (HCC) Last hemoglobin A1c 12.9 on 11/29.   - hypoglycemia this AM - sensitive SSI -Resume Lantus 18 units daily once tolerating p.o.   Chronic osteomyelitis of the left foot Patient recommended to be on Augmentin twice daily until at least 1/17 ID follow up with Dr. Tommy [name] and has been followed in the outpatient setting by infectious disease and wound care.  Previously refused surgery. -Hold Augmentin (currently on ceftriaxone -> given her cultures from last hospitalization with enterococcus, she probably needs to continue augmentin/unasyn, but developed UTI on this med - discussed with ID via secure chat as noted above).     T12 vertebral fracture Noted during last hospitalization on 11/28.  Patient had been in rehab.   Complete heart block s/p permanent pacemaker    DVT prophylaxis: TED hose Code Status: DNR Family Communication: discussed with  [name] (caregiver)  Disposition:   Status is: Inpatient Remains inpatient appropriate because: continued need for care with urology   Consultants:  urology  Procedures:  none  Antimicrobials:  Anti-infectives (From admission, onward)    Start     Dose/Rate Route Frequency Ordered Stop   05/07/22 1845  cefTRIAXone (ROCEPHIN) 1 g in sodium chloride 0.9 % 100 mL IVPB  Status:  Discontinued        1 g 200 mL/hr over 30 Minutes Intravenous Every 24 hours 05/07/22 1838 05/09/22 1716       Subjective: No complaints  Objective: Vitals:   05/09/22 0343 05/09/22 0813 05/09/22 1457 05/09/22 1620  BP: (!) 116/43 (!) 134/49 (!) 114/42 (!) 105/50  Pulse: 78 85 94   Resp: (!) 22 20    Temp: 98.2 F (36.8 C) 98.3 F (36.8 C) 98.4 F (36.9 C)   TempSrc: Oral Oral Oral   SpO2: 99% 100% 97%   Weight:      Height:        Intake/Output Summary (Last 24 hours) at 05/09/2022 1720 Last data filed at 05/09/2022 1548 Gross per 24 hour  Intake 2643.82 ml  Output 6650 ml  Net -4006.18 ml   Filed Weights   05/07/22 1243  Weight: 54.4 kg    Examination:  General: No acute distress. Cardiovascular: RRR Lungs: unlabored Abdomen: Soft,  nontender, nondistended  Neurological: Alert, slightly confused. Moves all extremities 4. Cranial nerves II through XII grossly intact. Extremities: No clubbing or cyanosis. No edema.  Data Reviewed: I have personally reviewed following labs and imaging studies  CBC: Recent Labs  Lab 05/07/22 1246 05/07/22 1833 05/08/22 1247 05/09/22 0427  WBC 11.4*  --  9.7 9.0  NEUTROABS 8.6*  --   --  6.4  HGB 9.2* 9.3* 8.9* 8.4*  HCT 27.8* 29.0* 28.6* 26.6*  MCV 82.5  --  85.6 85.3  PLT 304  --  262 267    Basic Metabolic Panel: Recent Labs  Lab 05/07/22 1315 05/08/22 1247 05/09/22 0427  NA 128* 130* 132*  K 4.3 3.9 4.1  CL 93* 96* 98  CO2 27 23 25   GLUCOSE 112* 86 93  BUN 17 11 7*  CREATININE 0.75 0.54 0.50  CALCIUM 8.4* 7.9* 8.0*   MG  --   --  1.5*  PHOS  --   --  2.3*    GFR: Estimated Creatinine Clearance: 43.3 mL/min (by C-G formula based on SCr of 0.5 mg/dL).  Liver Function Tests: Recent Labs  Lab 05/07/22 1315 05/09/22 0427  AST 32 24  ALT 13 12  ALKPHOS 56 49  BILITOT 0.1* 0.4  PROT 6.2* 5.6*  ALBUMIN 2.1* 1.9*    CBG: Recent Labs  Lab 05/08/22 2044 05/09/22 0838 05/09/22 0936 05/09/22 1130 05/09/22 1554  GLUCAP 111* 57* 88 118* 133*     No results found for this or any previous visit (from the past 240 hour(s)).       Radiology Studies: VAS 07/08/22 LOWER EXTREMITY VENOUS (DVT)  Result Date: 05/09/2022  Lower Venous DVT Study Patient Name:  MCKENZE SLONE  Date of Exam:   05/08/2022 Medical Rec #: 07/07/2022         Accession #:    [id number] Date of Birth: 09/27/1935         Patient Gender: F Patient Age:   34 years Exam Location:  [facility] Procedure:      VAS [facility] LOWER EXTREMITY VENOUS (DVT) Referring Phys: [place] --------------------------------------------------------------------------------  Indications: Gross hematuria, on Eliquis for chronic DVT, need to take patient off blood thinners.  Limitations: Pain with compression. Comparison Study: Prior left LEV done 01/11/22 indicated chronic left DVT in the                   femoral. popliteal, and peroneal veins. Performing Technologist: 03/13/22 RVS  Examination Guidelines: Lourine Alberico complete evaluation includes B-mode imaging, spectral Doppler, color Doppler, and power Doppler as needed of all accessible portions of each vessel. Bilateral testing is considered an integral part of Jaley Yan complete examination. Limited examinations for reoccurring indications may be performed as noted. The reflux portion of the exam is performed with the patient in reverse Trendelenburg.  +---------+---------------+---------+-----------+----------+--------------+ RIGHT    CompressibilityPhasicitySpontaneityPropertiesThrombus Aging  +---------+---------------+---------+-----------+----------+--------------+ CFV      Full           Yes      Yes                                 +---------+---------------+---------+-----------+----------+--------------+ SFJ      Full                                                        +---------+---------------+---------+-----------+----------+--------------+  FV Prox  Full                                                        +---------+---------------+---------+-----------+----------+--------------+ FV Mid   Full                                                        +---------+---------------+---------+-----------+----------+--------------+ FV DistalFull           Yes      Yes                                 +---------+---------------+---------+-----------+----------+--------------+ PFV      Full                                                        +---------+---------------+---------+-----------+----------+--------------+ POP      Full                                                        +---------+---------------+---------+-----------+----------+--------------+ PTV      Full                                                        +---------+---------------+---------+-----------+----------+--------------+ PERO     Full                                                        +---------+---------------+---------+-----------+----------+--------------+   +---------+---------------+---------+-----------+----------+--------------+ LEFT     CompressibilityPhasicitySpontaneityPropertiesThrombus Aging +---------+---------------+---------+-----------+----------+--------------+ CFV      Full           Yes      Yes                                 +---------+---------------+---------+-----------+----------+--------------+ SFJ      Full                                                         +---------+---------------+---------+-----------+----------+--------------+ FV Prox  Full                                                        +---------+---------------+---------+-----------+----------+--------------+  FV Mid   Full                                                        +---------+---------------+---------+-----------+----------+--------------+ FV DistalFull                                                        +---------+---------------+---------+-----------+----------+--------------+ PFV      Full                                                        +---------+---------------+---------+-----------+----------+--------------+ POP      Full           Yes      Yes                                 +---------+---------------+---------+-----------+----------+--------------+ PTV      Full                                                        +---------+---------------+---------+-----------+----------+--------------+ PERO     Full                                                        +---------+---------------+---------+-----------+----------+--------------+     Summary: BILATERAL: - No evidence of deep vein thrombosis seen in the lower extremities, bilaterally. -No evidence of popliteal cyst, bilaterally.  LEFT: - Findings suggest resolution of previously noted thrombus.  *See table(s) above for measurements and observations. Electronically signed by Waverly Ferrarihristopher Dickson MD on 05/09/2022 at 3:48:08 PM.    Final    CT ABDOMEN PELVIS W WO CONTRAST  Result Date: 05/08/2022 CLINICAL DATA:  Hematuria.  Respiratory distress. EXAM: CT ABDOMEN AND PELVIS WITHOUT AND WITH CONTRAST TECHNIQUE: Multidetector CT imaging of the abdomen and pelvis was performed following the standard protocol before and following the bolus administration of intravenous contrast. RADIATION DOSE REDUCTION: This exam was performed according to the departmental dose-optimization program  which includes automated exposure control, adjustment of the mA and/or kV according to patient size and/or use of iterative reconstruction technique. CONTRAST:  125mL OMNIPAQUE IOHEXOL 350 MG/ML SOLN COMPARISON:  04/04/2022 FINDINGS: Lower chest: Atelectasis and consolidation identified within the left lower lobe. No pleural fluid. Hepatobiliary: No focal liver abnormality is seen. Status post cholecystectomy. No biliary dilatation. Pancreas: Unremarkable. No pancreatic ductal dilatation or surrounding inflammatory changes. Spleen: Normal in size without focal abnormality. Adrenals/Urinary Tract: Normal adrenal glands. No nephrolithiasis identified bilaterally. Bilateral hydronephrosis is identified, right greater than left. Bilateral hydroureter to the level of the urinary bladder is noted. Bilateral striated nephrograms identified concerning for  pyelonephritis. No discrete mass. Diminished contrast opacification of the ureters on the delayed images. Marked diffuse circumferential wall thickening of the urinary bladder is identified which is now collapsed around Morris Longenecker Foley catheter balloon. Assessment for underlying bladder lesion is diminished due to incomplete distension. There is Eshan Trupiano defect with along the posterior wall of the urethra with possible fistulous communication with the vagina, image 62/12. Stomach/Bowel: Stomach appears normal. The appendix is suboptimally visualized separate from the right lower quadrant bowel loops. No pathologic dilatation of the large or small bowel loops. No bowel wall thickening or inflammation. Vascular/Lymphatic: Aortic atherosclerosis. No aneurysm. No signs of abdominopelvic adenopathy. Reproductive: Status post hysterectomy. No adnexal masses. Other: No free fluid or fluid collections identified. Musculoskeletal: Fracture deformity involving the superior endplate of Y85 is identified in appears stable from Sarely Stracener 04/06/22. Degenerative disc disease identified within the lower lumbar  spine. IMPRESSION: 1. Marked diffuse circumferential wall thickening of the urinary bladder which is now collapsed around Laithan Conchas Foley catheter balloon. Assessment for underlying bladder lesion is diminished due to incomplete distension. 2. Bilateral hydronephrosis and hydroureter to the level of the urinary bladder. Findings may reflect sequelae of chronic bladder outlet obstruction. 3. Bilateral striated nephrograms concerning for pyelonephritis. 4. There is Eadie Repetto defect with along the posterior wall of the urethra with possible fistulous communication with the vagina. Clinical correlation advised. 5. Atelectasis and consolidation within the left lower lobe. New from 04/04/2022 6. Stable T12 compression fracture. 7.  Aortic Atherosclerosis (ICD10-I70.0). Electronically Signed   By: Kerby Moors M.D.   On: 05/08/2022 11:02        Scheduled Meds:  sodium chloride   Intravenous Once   arformoterol  15 mcg Nebulization BID   And   umeclidinium bromide  1 puff Inhalation Daily   [START ON 05/10/2022] insulin aspart  0-9 Units Subcutaneous TID WC   sodium chloride flush  3 mL Intravenous Q12H   Continuous Infusions:  sodium chloride 75 mL/hr at 05/09/22 1711   sodium chloride irrigation       LOS: 2 days    Time spent: over 30 min    Fayrene Helper, MD [facility]   To contact the attending provider between 7A-7P or the covering provider during after hours 7P-7A, please log into the web site [email or web address] and access using universal [place] password for that web site. If you do not have the password, please call the hospital operator.  05/09/2022, 5:20 PM

## 2022-05-10 ENCOUNTER — Inpatient Hospital Stay (HOSPITAL_COMMUNITY): Payer: Medicare Other

## 2022-05-10 DIAGNOSIS — Z515 Encounter for palliative care: Secondary | ICD-10-CM

## 2022-05-10 DIAGNOSIS — G9341 Metabolic encephalopathy: Secondary | ICD-10-CM | POA: Diagnosis not present

## 2022-05-10 DIAGNOSIS — D72829 Elevated white blood cell count, unspecified: Secondary | ICD-10-CM | POA: Diagnosis not present

## 2022-05-10 DIAGNOSIS — Z66 Do not resuscitate: Secondary | ICD-10-CM | POA: Diagnosis not present

## 2022-05-10 DIAGNOSIS — Z7189 Other specified counseling: Secondary | ICD-10-CM

## 2022-05-10 DIAGNOSIS — R310 Gross hematuria: Secondary | ICD-10-CM | POA: Diagnosis not present

## 2022-05-10 DIAGNOSIS — D62 Acute posthemorrhagic anemia: Secondary | ICD-10-CM | POA: Diagnosis not present

## 2022-05-10 LAB — CBC WITH DIFFERENTIAL/PLATELET
Abs Immature Granulocytes: 0.11 10*3/uL — ABNORMAL HIGH (ref 0.00–0.07)
Basophils Absolute: 0 10*3/uL (ref 0.0–0.1)
Basophils Relative: 0 %
Eosinophils Absolute: 0.1 10*3/uL (ref 0.0–0.5)
Eosinophils Relative: 1 %
HCT: 24.8 % — ABNORMAL LOW (ref 36.0–46.0)
Hemoglobin: 7.8 g/dL — ABNORMAL LOW (ref 12.0–15.0)
Immature Granulocytes: 2 %
Lymphocytes Relative: 26 %
Lymphs Abs: 1.8 10*3/uL (ref 0.7–4.0)
MCH: 26.5 pg (ref 26.0–34.0)
MCHC: 31.5 g/dL (ref 30.0–36.0)
MCV: 84.4 fL (ref 80.0–100.0)
Monocytes Absolute: 0.3 10*3/uL (ref 0.1–1.0)
Monocytes Relative: 4 %
Neutro Abs: 4.6 10*3/uL (ref 1.7–7.7)
Neutrophils Relative %: 67 %
Platelets: 261 10*3/uL (ref 150–400)
RBC: 2.94 MIL/uL — ABNORMAL LOW (ref 3.87–5.11)
RDW: 15.1 % (ref 11.5–15.5)
WBC: 6.8 10*3/uL (ref 4.0–10.5)
nRBC: 0 % (ref 0.0–0.2)

## 2022-05-10 LAB — PHOSPHORUS: Phosphorus: 1.5 mg/dL — ABNORMAL LOW (ref 2.5–4.6)

## 2022-05-10 LAB — MAGNESIUM: Magnesium: 1.7 mg/dL (ref 1.7–2.4)

## 2022-05-10 LAB — COMPREHENSIVE METABOLIC PANEL
ALT: 12 U/L (ref 0–44)
AST: 20 U/L (ref 15–41)
Albumin: 1.7 g/dL — ABNORMAL LOW (ref 3.5–5.0)
Alkaline Phosphatase: 43 U/L (ref 38–126)
Anion gap: 6 (ref 5–15)
BUN: <5 mg/dL — ABNORMAL LOW (ref 8–23)
CO2: 28 mmol/L (ref 22–32)
Calcium: 7.4 mg/dL — ABNORMAL LOW (ref 8.9–10.3)
Chloride: 96 mmol/L — ABNORMAL LOW (ref 98–111)
Creatinine, Ser: 0.41 mg/dL — ABNORMAL LOW (ref 0.44–1.00)
GFR, Estimated: >60 mL/min (ref >60)
Glucose, Bld: 134 mg/dL — ABNORMAL HIGH (ref 70–99)
Potassium: 3.4 mmol/L — ABNORMAL LOW (ref 3.5–5.1)
Sodium: 130 mmol/L — ABNORMAL LOW (ref 135–145)
Total Bilirubin: 0.2 mg/dL — ABNORMAL LOW (ref 0.3–1.2)
Total Protein: 5 g/dL — ABNORMAL LOW (ref 6.5–8.1)

## 2022-05-10 LAB — GLUCOSE, CAPILLARY
Glucose-Capillary: 199 mg/dL — ABNORMAL HIGH (ref 70–99)
Glucose-Capillary: 259 mg/dL — ABNORMAL HIGH (ref 70–99)

## 2022-05-10 MED ORDER — ALBUTEROL SULFATE (2.5 MG/3ML) 0.083% IN NEBU
2.5000 mg | INHALATION_SOLUTION | RESPIRATORY_TRACT | Status: DC | PRN
  Administered 2022-05-10 – 2022-05-11 (×2): 2.5 mg via RESPIRATORY_TRACT
  Filled 2022-05-10 (×2): qty 3

## 2022-05-10 MED ORDER — CHLORHEXIDINE GLUCONATE CLOTH 2 % EX PADS
6.0000 | MEDICATED_PAD | Freq: Every day | CUTANEOUS | Status: DC
  Administered 2022-05-10 – 2022-05-11 (×2): 6 via TOPICAL

## 2022-05-10 MED ORDER — GERHARDT'S BUTT CREAM
TOPICAL_CREAM | Freq: Three times a day (TID) | CUTANEOUS | Status: DC
  Filled 2022-05-10: qty 1

## 2022-05-10 NOTE — Consult Note (Signed)
   [facility] Inpatient Consult   05/10/2022  [name] Apr 19, 1936 [id number]  [facility] [ACO] Patient: [name]  Primary Care Provider:  Wardell Honour, MD with [facility]   Patient screened for less than 30 days readmission hospitalization with noted extreme  risk score for unplanned readmission risk.  Reviewed to assess for potential [place] Management service needs for post hospital transition for care coordination.  Review of patient's electronic medical record [MD progress notes, reveals patient was from a skilled nursing facility and had recent guardianship with APS per DC summary 04/27/22 to [facility] for LTC noted. Rounding on patient and patient with NP at the time.   Plan: Alerted and followed up with inpatient TOC RNCM to alert of last  admission/transition to SNF.  Continue to follow progress and disposition to assess for post hospital community care coordination/management needs.  Referral request for community care coordination:  [name] is not a [facility] affiliated facility  Of note, [facility] does not replace or interfere with any arrangements made by the Inpatient Transition of Care team.  For questions contact:   [name] Brood, RN BSN [place]  [phone] business mobile phone Toll free office [phone]  *[place]  [phone] Fax number: [phone] [name]

## 2022-05-10 NOTE — Consult Note (Signed)
Consultation Note Date: 05/10/2022   Patient Name: [name]  DOB: [birth year]  MRN: [id number]  Age / Sex: 87 y.o., female  PCP: Wardell Honour, MD Referring Physician: Flora Lipps, MD  Reason for Consultation: Establishing goals of care  HPI/Patient Profile: 87 y.o. female  with past medical history of diabetes, COPD, complete heart block status post PPM, chronic respiratory failure on oxygen, DVT on [facility], and left foot osteomyelitis admitted on 05/07/2022 with hematuria with anemia.  Treated for UTI.  Anticoagulation on hold.  PMT consulted to discuss goals of care.  Of note, PMT was consulted as decision-maker was unclear and discharge planning clear.  Upon chart review, it was determined during last admission on April 12, 2022 patient did not have the capacity to make medical decisions for herself and so she was referred to APS for guardianship.  Discussed with TOC and found patient does have legal guardian assigned - Samule Ohm @ [phone]  Clinical Assessment and Goals of Care: I have reviewed medical records including EPIC notes, labs and imaging, received report from RN, assessed the patient and then met with patient  to discuss diagnosis prognosis, GOC, EOL wishes, disposition and options.  I introduced Palliative Medicine as specialized medical care for people living with serious illness. It focuses on providing relief from the symptoms and stress of a serious illness. The goal is to improve quality of life for both the patient and the family.  Patient's mental status fluctuates throughout conversation.  At times she seems completely oriented and aware of situation but then also has periods of confusion or with bizarre statements that do not make sense in context of conversation.  Patient is able to tell me her name and that she is in the hospital.  She is not sure why she is in the hospital but she does recall speaking to  Dr. Alinda Money about her bladder and the need for future evaluation.  She tells me she lives at home alone with her cats.  She is unable to tell me her cats names.  She tells me she just wants to go home.  She tells me "I am not a Moron" and further explains that she understands she needs to work with therapy to get stronger if she ever wants to go home but then also explained she does not want to work with therapy as it is too difficult for her.  We discussed concern of her ability to care for herself at home but she disagrees with this and tells me she has a caregiver that can provide care during the day and that is all she needs.  We discussed her symptoms and she complains of severe perineal pain.  This was assessed and revealed excoriated tissue.  Sacral pad placed that provided relief to patient.  Consulted wound care.   Called to patient's legal guardian Reeves Forth.  Reeves Forth is aware of patient's hospitalization.  Update provided.  Reeves Forth shares the discharge plan is for patient to go back to her long-term care facility at [facility].  Camila has been in touch with [facility] and they are expecting her return.  Questions and concerns were addressed.   Primary Decision Maker LEGAL GUARDIAN - Samule Ohm [phone]    SUMMARY OF RECOMMENDATIONS   - wound care consult - legal guardian has been established and has been updated - plans to go back to LTC - patient disagrees but has been deemed incompetent to make medical decisions - legal guardian aware of  patients desires but unfortunately no safe plan for patient to return home with 24/7 care - Recommend palliative care follow up at facility  Code Status/Advance Care Planning: DNR   Discharge Planning: [place] for rehab with Palliative care service follow-up      Primary Diagnoses: Present on Admission:  Hematuria  ABLA (acute blood loss anemia)  Acute metabolic encephalopathy  Leukocytosis  COPD mixed type with  chronic respiratory failure   Uncontrolled type 2 diabetes mellitus with hyperglycemia (HCC)  Chronic osteomyelitis of left foot (HCC)  Chronic respiratory failure with hypoxia ([place])  Hypertension  Pacemaker  Complete heart block ([place])   I have reviewed the medical record, interviewed the patient and family, and examined the patient. The following aspects are pertinent.  Past Medical History:  Diagnosis Date   Acute ischemic stroke ([place]) 08/10/2021   Anxiety disorder, unspecified    Arthritis    Benign paroxysmal positional vertigo 08/09/2012   Chronic rhinitis    Cognitive communication deficit    Constipation, unspecified    COPD mixed type ([place])    on home O2   Diverticulosis    DKA (diabetic ketoacidosis) ([place]) 10/02/2021   Esophageal stricture    Essential (primary) hypertension    Gastro-esophageal reflux disease without esophagitis    Glaucoma    Hiatal hernia    Hyperlipidemia, unspecified    Irritable bowel syndrome    Ischemic stroke ([place]) 08/09/2021   Osteomyelitis of fifth toe of left foot ([place]) 03/14/2022   Presence of permanent cardiac pacemaker 01/14/2020   Spinal stenosis, lumbar region, without neurogenic claudication    Steatohepatitis    Thrombocytopenia, unspecified (HCC)    Type 2 diabetes mellitus with diabetic polyneuropathy ([place])    Social History   Socioeconomic History   Marital status: Single    Spouse name: John,divorced, deceased   Number of children: 0   Years of education: high school   Highest education level: High school graduate  Occupational History   Occupation: Retired     Comment: Training and development officer  Tobacco Use   Smoking status: Former    Types: Cigarettes    Quit date: 05/08/1988    Years since quitting: 34.0   Smokeless tobacco: Never  Vaping Use   Vaping Use: Never used  Substance and Sexual Activity   Alcohol use: No    Alcohol/week: 0.0 standard drinks of alcohol   Drug use: No   Sexual activity: Not Currently  Other Topics  Concern   Not on file  Social History Narrative   Not on file   Social Determinants of Health   Financial Resource Strain: Low Risk  (10/24/2021)   Overall Financial Resource Strain (CARDIA)    Difficulty of Paying Living Expenses: Not hard at all  Food Insecurity: No Food Insecurity (01/16/2022)   Hunger Vital Sign    Worried About Running Out of Food in the Last Year: Never true    Ran Out of Food in the Last Year: Never true  Transportation Needs: No Transportation Needs (01/16/2022)   PRAPARE - Hydrologist (Medical): No    Lack of Transportation (Non-Medical): No  Recent Concern: Transportation Needs - Unmet Transportation Needs (10/24/2021)   PRAPARE - Transportation    Lack of Transportation (Medical): Yes    Lack of Transportation (Non-Medical): Yes  Physical Activity: Not on file  Stress: No Stress Concern Present (04/28/2020)   [employer] [facility] [place]    Feeling  of Stress : Only a little  Social Connections: Moderately Isolated (02/17/2020)   Social Connection and Isolation Panel [NHANES]    Frequency of Communication with Friends and Family: More than three times a week    Frequency of Social Gatherings with Friends and Family: More than three times a week    Attends Religious Services: More than 4 times per year    Active Member of [employer] or Organizations: No    Attends Music therapist: Patient refused    Marital Status: Divorced   Family History  Problem Relation Age of Onset   Heart disease Father    Lung cancer Mother        small cell;Byssinosis   Lung cancer Sister    Liver cancer Sister        ? mets from another area of the body   Diabetes Other        grandmother   Stroke Maternal Grandfather    Scheduled Meds:  sodium chloride   Intravenous Once   amoxicillin-clavulanate  1 tablet Oral Q12H   arformoterol  15 mcg Nebulization BID   And   umeclidinium  bromide  1 puff Inhalation Daily   Chlorhexidine Gluconate Cloth  6 each Topical Daily   Gerhardt's butt cream   Topical TID   insulin aspart  0-9 Units Subcutaneous TID WC   sodium chloride flush  3 mL Intravenous Q12H   Continuous Infusions:  sodium chloride 75 mL/hr at 05/09/22 1711   sodium chloride irrigation     PRN Meds:.acetaminophen **OR** acetaminophen, albuterol, benzonatate Allergies  Allergen Reactions   Chlordiazepoxide-Clidinium Other (See Comments)    Sleepy,weak- "Allergic," per MAR   Biaxin [Clarithromycin] Diarrhea and Other (See Comments)    Foul taste, abd pain, diarrhea- "Allergic," per MAR   Chlordiazepoxide Other (See Comments)    "Allergic," per [facility]   Tape Other (See Comments)    SKIN IS VERY THIN AND WILL TEAR AND BRUISE EASILY!! "Allergic," per MAR   Ultram [Tramadol] Other (See Comments)    Caused tremors   Codeine Nausea Only and Other (See Comments)    Upset the stomach- "Allergic," per MAR   Review of Systems  Constitutional:  Positive for activity change and fatigue.  Genitourinary:        Perineal pain r/t skin breakdown  Neurological:  Positive for weakness.  Psychiatric/Behavioral:  Positive for agitation.     Physical Exam Constitutional:      General: She is not in acute distress.    Appearance: She is ill-appearing.  Pulmonary:     Effort: Pulmonary effort is normal.  Genitourinary:    Comments: Excoriated perineum Skin:    General: Skin is warm and dry.  Neurological:     Mental Status: She is alert.     Comments: Intermittent confusion     Vital Signs: BP 104/70 (BP Location: Right Arm)   Pulse 86   Temp 98.4 F (36.9 C) (Oral)   Resp 19   Ht 5' 3" (1.6 m)   Wt 54.4 kg   SpO2 99%   BMI 21.26 kg/m  Pain Scale: 0-10   Pain Score: 0-No pain   SpO2: SpO2: 99 % O2 Device:SpO2: 99 % O2 Flow Rate: .O2 Flow Rate (L/min): 2 L/min  IO: Intake/output summary:  Intake/Output Summary (Last 24 hours) at 05/10/2022  1536 Last data filed at 05/10/2022 1400 Gross per 24 hour  Intake 1549.46 ml  Output 3245 ml  Net -1695.54 ml  LBM: Last BM Date : 05/09/22 Baseline Weight: Weight: 54.4 kg Most recent weight: Weight: 54.4 kg     Palliative Assessment/Data: PPS 40%     *Please note that this is a verbal dictation therefore any spelling or grammatical errors are due to the "[employer]" system interpretation.  Juel Burrow, DNP, AGNP-C Palliative Medicine Team [phone] Pager: [phone]

## 2022-05-10 NOTE — Progress Notes (Signed)
Patient ID: [name], female   DOB: 1935-10-16, 87 y.o.   MRN: [id number]    Subjective: Pt without new complaints.  Now off CBI.  Urine has remained clear with a few small clots per nursing.  There has not been a need to irrigate the catheter.  Objective: Vital signs in last 24 hours: Temp:  [98.3 F (36.8 C)-98.6 F (37 C)] 98.5 F (36.9 C) (01/03 0612) Pulse Rate:  [68-98] 68 (01/03 0612) Resp:  [20-22] 22 (01/03 0612) BP: (105-134)/(42-51) 115/51 (01/03 0612) SpO2:  [97 %-100 %] 100 % (01/03 0612)  Intake/Output from previous day: 01/02 0701 - 01/03 0700 In: 2213.5 [P.O.:480; I.V.:1733.5] Out: 3875 [Urine:3875] Intake/Output this shift: No intake/output data recorded.  Physical Exam:  General: Alert and oriented GU: Urine clear in catheter  Lab Results: Recent Labs    05/08/22 1247 05/09/22 0427 05/10/22 0254  HGB 8.9* 8.4* 7.8*  HCT 28.6* 26.6* 24.8*   Lab Results  Component Value Date   WBC 6.8 05/10/2022   HGB 7.8 (L) 05/10/2022   HCT 24.8 (L) 05/10/2022   MCV 84.4 05/10/2022   PLT 261 05/10/2022     BMET Recent Labs    05/09/22 0427 05/10/22 0254  NA 132* 130*  K 4.1 3.4*  CL 98 96*  CO2 25 28  GLUCOSE 93 134*  BUN 7* <5*  CREATININE 0.50 0.41*  CALCIUM 8.0* 7.4*     Studies/Results:   Assessment/Plan: 1) Hematuria: Urine has now cleared off CBI.  Etiology most likely related to infection but will need outpatient cystoscopy following hospitalization.  Continue empiric antibiotic therapy  Continue Foley for now and will consider removal later in hospitalization. 2) Hydronephrosis: Likely due to either chronic reflux vs urinary retention.  Repeat renal ultrasound.  Cr normal.    LOS: 3 days   Dutch Gray 05/10/2022, 7:15 AM

## 2022-05-10 NOTE — Progress Notes (Signed)
PROGRESS NOTE    [name]  VOJ:[id number] DOB: [birth year] DOA: 05/07/2022 PCP: Wardell Honour, MD   Brief Narrative:  [name] is a 87 y.o. female with past medical history of complete heart block status post permanent pacemaker placement, chronic respiratory failure on 2 to 3 L of oxygen at baseline, diabetes mellitus type 2, DVT on chronic anticoagulation,  frequent UTIs, chronic left foot osteomyelitis presented to the hospital with large amount of vaginal bleeding.  Subsequently, patient was thought to have be gross hematuria.  Patient did have some mild encephalopathy.  Urology was consulted.  Patient was then admitted to the hospital for further evaluation and treatment.  Assessment & Plan:   Principal Problem:   Hematuria Active Problems:   ABLA (acute blood loss anemia)   Acute metabolic encephalopathy   Leukocytosis   Bilateral hydronephrosis   History of DVT (deep vein thrombosis)   Chronic anticoagulation   Hypertension   COPD mixed type with chronic respiratory failure    Chronic respiratory failure with hypoxia (HCC)   Uncontrolled type 2 diabetes mellitus with hyperglycemia (HCC)   Chronic osteomyelitis of left foot (HCC)   Complete heart block (HCC)   Pacemaker   Acute blood loss anemia secondary to hematuria Hold Eliquis.  Hemoglobin today at 7.8.  Continue Foley catheter, was on continuous bladder irrigation which has been discontinued.  Mild clots as per nursing.  Hematuria thought to be secondary to UTI.   Urology on board.  CT scan of the abdomen and pelvis with circumferential thickening of the urinary bladder bilateral hydro and ureter nephrosis, bilateral striated nephrograms, possible fistulous communication with vagina, patient will need cystoscopy as outpatient.   Acute metabolic encephalopathy Intermittent confusion and disorientation.  Likely secondary to infection.  Continue delirium precautions.  Currently able to  communicate  Leukocytosis  Urinary Tract Infection, concern for Pyelonephritis Patient had leukocytosis.  Was previously on Augmentin for osteomyelitis.  Has been reinitiated.  Urine culture pending.    Bilateral hydronephrosis Likely obstructive due to clotted blood in the bladder.  Urology was consulted and patient received Foley catheter.  Currently light pink urine.   Consolidation within Left Lower Lobe On the scan of the CT.  No respiratory symptoms.  Already on Augmentin.  History of DVT on chronic anticoagulation On Eliquis for left lower extremity DVT 01/14/2022.  Currently on hold due to hematuria. -[place] of LLE shows resolution of previously noted thrombus.  Patient has completed 3 months and did not have previous  DVT will consider discontinuation at discharge.    Essential hypertension Antihypertensives on hold.  Blood pressure stable at this time.   COPD, without acute exacerbation  Chronic respiratory failure with hypoxia Patient is on 2 to 3 L of oxygen at baseline.  Continue breathing treatments.    Uncontrolled type 2 diabetes mellitus with hyperglycemia (HCC) Last hemoglobin A1c 12.9 on 11/29.  Continue sliding scale insulin, Accu-Cheks.  Long-acting insulin when p.o. okay.  Chronic osteomyelitis of the left foot Patient is on Augmentin twice daily until 05/24/2022 as recommended by Dr. Drucilla Schmidt infectious disease.  Had previously refused surgery.  Currently on  Augmentin   T12 vertebral fracture Noted during 11/28 hospital stay.   Complete heart block s/p permanent pacemaker Stable at this time.  Disposition Physical therapy recommending SNF (24 hour care), but she wants to go home.  Apparently she has caregiver Elveria Rising) but not 24 hour care.  Olin Hauser is there large stretches of day.  At last hospital stay, she did not demonstrate capacity to make medical decisions based on psychiatry evaluation (12/21 discharge summary).  The d/c summary notes APS assumed  guardianship?  Palliative care has been consulted to establish goals of care and medical decision makers.  DVT prophylaxis: TED hose  Code Status: DNR  Family Communication: None at bedside.  Disposition:   Status is: Inpatient Remains inpatient appropriate because: Pending clinical improvement, concerns on disposition plan and decision making.   Consultants:  Urology  Procedures:  Continuous bladder irrigation.  Antimicrobials:  Augmentin.   Subjective: Today, patient was seen and examined at bedside.  Patient denies any nausea, vomiting, fever, chills or rigor.  Foley catheter with pinkish urine.  CBI has been discontinued.  Patient states that she lives by herself at home and drives.  Has a brother but has cancer.  Objective: Vitals:   05/09/22 1900 05/09/22 2020 05/10/22 0612 05/10/22 0820  BP:  (!) 113/49 (!) 115/51 (!) 114/41  Pulse:  98 68 82  Resp:  20 (!) 22 18  Temp:  98.6 F (37 C) 98.5 F (36.9 C) 98.1 F (36.7 C)  TempSrc:  Oral Oral Oral  SpO2: 97% 97% 100% 96%  Weight:      Height:        Intake/Output Summary (Last 24 hours) at 05/10/2022 0847 Last data filed at 05/10/2022 0600 Gross per 24 hour  Intake 2213.47 ml  Output 3575 ml  Net -1361.53 ml    Filed Weights   05/07/22 1243  Weight: 54.4 kg   Body mass index is 21.26 kg/m.  General: Thinly built, not in obvious distress due to place and month on nasal cannula oxygen HENT:   No scleral pallor or icterus noted. Oral mucosa is moist.  Chest:  Clear breath sounds.  Diminished breath sounds bilaterally. No crackles or wheezes.  CVS: S1 &S2 heard. No murmur.  Regular rate and rhythm. Abdomen: Soft, nontender, nondistended.  Bowel sounds are heard.  Foley catheter with pinkish urine Extremities: No cyanosis, clubbing or edema.  Peripheral pulses are palpable. Psych: Alert, awake and confused to place and month CNS:  No cranial nerve deficits.  Power equal in all extremities.   Skin: Warm and  dry.  No rashes noted.  Data Reviewed: I have personally reviewed following labs and imaging studies  CBC: Recent Labs  Lab 05/07/22 1246 05/07/22 1833 05/08/22 1247 05/09/22 0427 05/10/22 0254  WBC 11.4*  --  9.7 9.0 6.8  NEUTROABS 8.6*  --   --  6.4 4.6  HGB 9.2* 9.3* 8.9* 8.4* 7.8*  HCT 27.8* 29.0* 28.6* 26.6* 24.8*  MCV 82.5  --  85.6 85.3 84.4  PLT 304  --  262 267 261     Basic Metabolic Panel: Recent Labs  Lab 05/07/22 1315 05/08/22 1247 05/09/22 0427 05/10/22 0254  NA 128* 130* 132* 130*  K 4.3 3.9 4.1 3.4*  CL 93* 96* 98 96*  CO2 27 23 25 28   GLUCOSE 112* 86 93 134*  BUN 17 11 7* <5*  CREATININE 0.75 0.54 0.50 0.41*  CALCIUM 8.4* 7.9* 8.0* 7.4*  MG  --   --  1.5* 1.7  PHOS  --   --  2.3* 1.5*     GFR: Estimated Creatinine Clearance: 43.3 mL/min (A) (by C-G formula based on SCr of 0.41 mg/dL (L)).  Liver Function Tests: Recent Labs  Lab 05/07/22 1315 05/09/22 0427 05/10/22 0254  AST 32 24 20  ALT 13 12  12  ALKPHOS 56 49 43  BILITOT 0.1* 0.4 0.2*  PROT 6.2* 5.6* 5.0*  ALBUMIN 2.1* 1.9* 1.7*     CBG: Recent Labs  Lab 05/09/22 0838 05/09/22 0936 05/09/22 1130 05/09/22 1554 05/09/22 2038  GLUCAP 57* 88 118* 133* 160*      Radiology Studies: VAS US LOWER EXTREMITY VENOUS (DVT)  Result Date: 05/09/2022  Lower Venous DVT Study Patient Name:  Rene PaciSHIRLEY Y Dowler  Date of Exam:   05/08/2022 Medical Rec #: [id number]         Accession #:    [id number] Date of Birth: November 26, 1935         Patient Gender: F Patient Age:   [age] years [facility]am [facility] Procedure:      VAS US LOWER EXTREMITY VENOUS (DVT) Referring Phys: Madelyn FlavorsONDELL SMITH --------------------------------------------------------------------------------  Indications: Gross hematuria, on Eliquis for chronic DVT, need to take patient off blood thinners.  Limitations: Pain with compression. Comparison Study: Prior left LEV done 01/11/22 indicated chronic left DVT in the                    femoral. popliteal, and peroneal veins. Performing Technologist: Sherren Kernsandace Kanady RVS  Examination Guidelines: A complete evaluation includes B-mode imaging, spectral Doppler, color Doppler, and power Doppler as needed of all accessible portions of each vessel. Bilateral testing is considered an integral part of a complete examination. Limited examinations for reoccurring indications may be performed as noted. The reflux portion of the exam is performed with the patient in reverse Trendelenburg.  +---------+---------------+---------+-----------+----------+--------------+ RIGHT    CompressibilityPhasicitySpontaneityPropertiesThrombus Aging +---------+---------------+---------+-----------+----------+--------------+ CFV      Full           Yes      Yes                                 +---------+---------------+---------+-----------+----------+--------------+ SFJ      Full                                                        +---------+---------------+---------+-----------+----------+--------------+ FV Prox  Full                                                        +---------+---------------+---------+-----------+----------+--------------+ FV Mid   Full                                                        +---------+---------------+---------+-----------+----------+--------------+ FV DistalFull           Yes      Yes                                 +---------+---------------+---------+-----------+----------+--------------+ PFV      Full                                                        +---------+---------------+---------+-----------+----------+--------------+  POP      Full                                                        +---------+---------------+---------+-----------+----------+--------------+ PTV      Full                                                        +---------+---------------+---------+-----------+----------+--------------+ PERO      Full                                                        +---------+---------------+---------+-----------+----------+--------------+   +---------+---------------+---------+-----------+----------+--------------+ LEFT     CompressibilityPhasicitySpontaneityPropertiesThrombus Aging +---------+---------------+---------+-----------+----------+--------------+ CFV      Full           Yes      Yes                                 +---------+---------------+---------+-----------+----------+--------------+ SFJ      Full                                                        +---------+---------------+---------+-----------+----------+--------------+ FV Prox  Full                                                        +---------+---------------+---------+-----------+----------+--------------+ FV Mid   Full                                                        +---------+---------------+---------+-----------+----------+--------------+ FV DistalFull                                                        +---------+---------------+---------+-----------+----------+--------------+ PFV      Full                                                        +---------+---------------+---------+-----------+----------+--------------+ POP      Full           Yes      Yes                                 +---------+---------------+---------+-----------+----------+--------------+  PTV      Full                                                        +---------+---------------+---------+-----------+----------+--------------+ PERO     Full                                                        +---------+---------------+---------+-----------+----------+--------------+     Summary: BILATERAL: - No evidence of deep vein thrombosis seen in the lower extremities, bilaterally. -No evidence of popliteal cyst, bilaterally.  LEFT: - Findings suggest resolution of previously  noted thrombus.  *See table(s) above for measurements and observations. Electronically signed by Waverly Ferrari MD on 05/09/2022 at 3:48:08 PM.    Final    CT ABDOMEN PELVIS W WO CONTRAST  Result Date: 05/08/2022 CLINICAL DATA:  Hematuria.  Respiratory distress. EXAM: CT ABDOMEN AND PELVIS WITHOUT AND WITH CONTRAST TECHNIQUE: Multidetector CT imaging of the abdomen and pelvis was performed following the standard protocol before and following the bolus administration of intravenous contrast. RADIATION DOSE REDUCTION: This exam was performed according to the departmental dose-optimization program which includes automated exposure control, adjustment of the mA and/or kV according to patient size and/or use of iterative reconstruction technique. CONTRAST:  OMNIPAQUE IOHEXOL 350 MG/ML SOLN COMPARISON:  04/04/2022 FINDINGS: Lower chest: Atelectasis and consolidation identified within the left lower lobe. No pleural fluid. Hepatobiliary: No focal liver abnormality is seen. Status post cholecystectomy. No biliary dilatation. Pancreas: Unremarkable. No pancreatic ductal dilatation or surrounding inflammatory changes. Spleen: Normal in size without focal abnormality. Adrenals/Urinary Tract: Normal adrenal glands. No nephrolithiasis identified bilaterally. Bilateral hydronephrosis is identified, right greater than left. Bilateral hydroureter to the level of the urinary bladder is noted. Bilateral striated nephrograms identified concerning for pyelonephritis. No discrete mass. Diminished contrast opacification of the ureters on the delayed images. Marked diffuse circumferential wall thickening of the urinary bladder is identified which is now collapsed around a Foley catheter balloon. Assessment for underlying bladder lesion is diminished due to incomplete distension. There is a defect with along the posterior wall of the urethra with possible fistulous communication with the vagina, image 62/12. Stomach/Bowel:  Stomach appears normal. The appendix is suboptimally visualized separate from the right lower quadrant bowel loops. No pathologic dilatation of the large or small bowel loops. No bowel wall thickening or inflammation. Vascular/Lymphatic: Aortic atherosclerosis. No aneurysm. No signs of abdominopelvic adenopathy. Reproductive: Status post hysterectomy. No adnexal masses. Other: No free fluid or fluid collections identified. Musculoskeletal: Fracture deformity involving the superior endplate of T12 is identified in appears stable from a 04/06/22. Degenerative disc disease identified within the lower lumbar spine. IMPRESSION: 1. Marked diffuse circumferential wall thickening of the urinary bladder which is now collapsed around a Foley catheter balloon. Assessment for underlying bladder lesion is diminished due to incomplete distension. 2. Bilateral hydronephrosis and hydroureter to the level of the urinary bladder. Findings may reflect sequelae of chronic bladder outlet obstruction. 3. Bilateral striated nephrograms concerning for pyelonephritis. 4. There is a defect with along the posterior wall of the urethra with possible fistulous communication with the vagina. Clinical correlation advised. 5. Atelectasis and consolidation within the  left lower lobe. New from 04/04/2022 6. Stable T12 compression fracture. 7.  Aortic Atherosclerosis (ICD10-I70.0). Electronically Signed   By: Signa Kell M.D.   On: 05/08/2022 11:02      Scheduled Meds:  sodium chloride   Intravenous Once   amoxicillin-clavulanate  1 tablet Oral Q12H   arformoterol  15 mcg Nebulization BID   And   umeclidinium bromide  1 puff Inhalation Daily   Chlorhexidine Gluconate Cloth  6 each Topical Daily   insulin aspart  0-9 Units Subcutaneous TID WC   sodium chloride flush  3 mL Intravenous Q12H   Continuous Infusions:  sodium chloride 75 mL/hr at 05/09/22 1711   sodium chloride irrigation       LOS: 3 days    Joycelyn Das,  MD [facility] 05/10/2022, 8:47 AM

## 2022-05-10 NOTE — Consult Note (Signed)
WOC Nurse Consult Note: Reason for Consult:Consult requested for redness and breakdown to bilat buttocks and perineum.  Performed remotely after review of progress notes.   Pt is noted to have incontinence and bleeding which has contributed towards redness, maceration and partial thickness skin loss.   Dressing procedure/placement/frequency: Topical treatment orders provided for bedside nurses to perform to repel moisture and promote healing as follows: Apply Gerhardts butt cream to buttoucks and perineum TID and PRN when turning and cleaning. Please re-consult if further assistance is needed.  Thank-you,  Julien Girt MSN, [place], [place], [place], [place]

## 2022-05-10 NOTE — Care Management Important Message (Signed)
Important Message  Patient Details  Name: [name] MRN: [id number] Date of Birth: 07-Sep-1935   Medicare Important Message Given:  Yes     [name] 05/10/2022, 11:05 AM

## 2022-05-11 ENCOUNTER — Other Ambulatory Visit: Payer: Self-pay | Admitting: Family Medicine

## 2022-05-11 DIAGNOSIS — N1330 Unspecified hydronephrosis: Secondary | ICD-10-CM | POA: Diagnosis not present

## 2022-05-11 DIAGNOSIS — Z79899 Other long term (current) drug therapy: Secondary | ICD-10-CM | POA: Diagnosis not present

## 2022-05-11 DIAGNOSIS — E1169 Type 2 diabetes mellitus with other specified complication: Secondary | ICD-10-CM | POA: Diagnosis not present

## 2022-05-11 DIAGNOSIS — E639 Nutritional deficiency, unspecified: Secondary | ICD-10-CM | POA: Diagnosis not present

## 2022-05-11 DIAGNOSIS — R0989 Other specified symptoms and signs involving the circulatory and respiratory systems: Secondary | ICD-10-CM | POA: Diagnosis not present

## 2022-05-11 DIAGNOSIS — L89600 Pressure ulcer of unspecified heel, unstageable: Secondary | ICD-10-CM | POA: Diagnosis not present

## 2022-05-11 DIAGNOSIS — Z87448 Personal history of other diseases of urinary system: Secondary | ICD-10-CM | POA: Diagnosis not present

## 2022-05-11 DIAGNOSIS — L89890 Pressure ulcer of other site, unstageable: Secondary | ICD-10-CM | POA: Diagnosis not present

## 2022-05-11 DIAGNOSIS — I442 Atrioventricular block, complete: Secondary | ICD-10-CM | POA: Diagnosis not present

## 2022-05-11 DIAGNOSIS — J449 Chronic obstructive pulmonary disease, unspecified: Secondary | ICD-10-CM | POA: Diagnosis not present

## 2022-05-11 DIAGNOSIS — Z09 Encounter for follow-up examination after completed treatment for conditions other than malignant neoplasm: Secondary | ICD-10-CM | POA: Diagnosis not present

## 2022-05-11 DIAGNOSIS — S22080D Wedge compression fracture of T11-T12 vertebra, subsequent encounter for fracture with routine healing: Secondary | ICD-10-CM | POA: Diagnosis not present

## 2022-05-11 DIAGNOSIS — L89620 Pressure ulcer of left heel, unstageable: Secondary | ICD-10-CM | POA: Diagnosis not present

## 2022-05-11 DIAGNOSIS — Z7984 Long term (current) use of oral hypoglycemic drugs: Secondary | ICD-10-CM | POA: Diagnosis not present

## 2022-05-11 DIAGNOSIS — N820 Vesicovaginal fistula: Secondary | ICD-10-CM | POA: Diagnosis not present

## 2022-05-11 DIAGNOSIS — D6859 Other primary thrombophilia: Secondary | ICD-10-CM | POA: Diagnosis not present

## 2022-05-11 DIAGNOSIS — E11621 Type 2 diabetes mellitus with foot ulcer: Secondary | ICD-10-CM | POA: Diagnosis not present

## 2022-05-11 DIAGNOSIS — N139 Obstructive and reflux uropathy, unspecified: Secondary | ICD-10-CM | POA: Diagnosis not present

## 2022-05-11 DIAGNOSIS — S22080A Wedge compression fracture of T11-T12 vertebra, initial encounter for closed fracture: Secondary | ICD-10-CM | POA: Diagnosis not present

## 2022-05-11 DIAGNOSIS — N3000 Acute cystitis without hematuria: Secondary | ICD-10-CM | POA: Diagnosis not present

## 2022-05-11 DIAGNOSIS — Z7401 Bed confinement status: Secondary | ICD-10-CM | POA: Diagnosis not present

## 2022-05-11 DIAGNOSIS — E1142 Type 2 diabetes mellitus with diabetic polyneuropathy: Secondary | ICD-10-CM | POA: Diagnosis not present

## 2022-05-11 DIAGNOSIS — L89222 Pressure ulcer of left hip, stage 2: Secondary | ICD-10-CM | POA: Diagnosis not present

## 2022-05-11 DIAGNOSIS — J9610 Chronic respiratory failure, unspecified whether with hypoxia or hypercapnia: Secondary | ICD-10-CM | POA: Diagnosis not present

## 2022-05-11 DIAGNOSIS — Z86718 Personal history of other venous thrombosis and embolism: Secondary | ICD-10-CM | POA: Diagnosis not present

## 2022-05-11 DIAGNOSIS — Z87440 Personal history of urinary (tract) infections: Secondary | ICD-10-CM | POA: Diagnosis not present

## 2022-05-11 DIAGNOSIS — I152 Hypertension secondary to endocrine disorders: Secondary | ICD-10-CM | POA: Diagnosis not present

## 2022-05-11 DIAGNOSIS — K219 Gastro-esophageal reflux disease without esophagitis: Secondary | ICD-10-CM | POA: Diagnosis not present

## 2022-05-11 DIAGNOSIS — I10 Essential (primary) hypertension: Secondary | ICD-10-CM | POA: Diagnosis not present

## 2022-05-11 DIAGNOSIS — E1149 Type 2 diabetes mellitus with other diabetic neurological complication: Secondary | ICD-10-CM | POA: Diagnosis not present

## 2022-05-11 DIAGNOSIS — Z960 Presence of urogenital implants: Secondary | ICD-10-CM | POA: Diagnosis not present

## 2022-05-11 DIAGNOSIS — D649 Anemia, unspecified: Secondary | ICD-10-CM | POA: Diagnosis not present

## 2022-05-11 DIAGNOSIS — E1165 Type 2 diabetes mellitus with hyperglycemia: Secondary | ICD-10-CM | POA: Diagnosis not present

## 2022-05-11 DIAGNOSIS — F419 Anxiety disorder, unspecified: Secondary | ICD-10-CM | POA: Diagnosis not present

## 2022-05-11 DIAGNOSIS — D72829 Elevated white blood cell count, unspecified: Secondary | ICD-10-CM | POA: Diagnosis not present

## 2022-05-11 DIAGNOSIS — E11649 Type 2 diabetes mellitus with hypoglycemia without coma: Secondary | ICD-10-CM | POA: Diagnosis not present

## 2022-05-11 DIAGNOSIS — Z794 Long term (current) use of insulin: Secondary | ICD-10-CM | POA: Diagnosis not present

## 2022-05-11 DIAGNOSIS — D62 Acute posthemorrhagic anemia: Secondary | ICD-10-CM | POA: Diagnosis not present

## 2022-05-11 DIAGNOSIS — R627 Adult failure to thrive: Secondary | ICD-10-CM | POA: Diagnosis not present

## 2022-05-11 DIAGNOSIS — Z8639 Personal history of other endocrine, nutritional and metabolic disease: Secondary | ICD-10-CM | POA: Diagnosis not present

## 2022-05-11 DIAGNOSIS — M86672 Other chronic osteomyelitis, left ankle and foot: Secondary | ICD-10-CM | POA: Diagnosis not present

## 2022-05-11 DIAGNOSIS — E46 Unspecified protein-calorie malnutrition: Secondary | ICD-10-CM | POA: Diagnosis not present

## 2022-05-11 DIAGNOSIS — N130 Hydronephrosis with ureteropelvic junction obstruction: Secondary | ICD-10-CM | POA: Diagnosis not present

## 2022-05-11 DIAGNOSIS — R1310 Dysphagia, unspecified: Secondary | ICD-10-CM | POA: Diagnosis not present

## 2022-05-11 DIAGNOSIS — Z7901 Long term (current) use of anticoagulants: Secondary | ICD-10-CM | POA: Diagnosis not present

## 2022-05-11 DIAGNOSIS — J9611 Chronic respiratory failure with hypoxia: Secondary | ICD-10-CM | POA: Diagnosis not present

## 2022-05-11 DIAGNOSIS — R310 Gross hematuria: Secondary | ICD-10-CM | POA: Diagnosis not present

## 2022-05-11 DIAGNOSIS — E118 Type 2 diabetes mellitus with unspecified complications: Secondary | ICD-10-CM | POA: Diagnosis not present

## 2022-05-11 DIAGNOSIS — Z741 Need for assistance with personal care: Secondary | ICD-10-CM | POA: Diagnosis not present

## 2022-05-11 DIAGNOSIS — G9341 Metabolic encephalopathy: Secondary | ICD-10-CM | POA: Diagnosis not present

## 2022-05-11 DIAGNOSIS — E1110 Type 2 diabetes mellitus with ketoacidosis without coma: Secondary | ICD-10-CM | POA: Diagnosis not present

## 2022-05-11 DIAGNOSIS — M869 Osteomyelitis, unspecified: Secondary | ICD-10-CM | POA: Diagnosis not present

## 2022-05-11 DIAGNOSIS — E43 Unspecified severe protein-calorie malnutrition: Secondary | ICD-10-CM | POA: Diagnosis not present

## 2022-05-11 DIAGNOSIS — R110 Nausea: Secondary | ICD-10-CM | POA: Diagnosis not present

## 2022-05-11 DIAGNOSIS — I479 Paroxysmal tachycardia, unspecified: Secondary | ICD-10-CM | POA: Diagnosis not present

## 2022-05-11 DIAGNOSIS — Z681 Body mass index (BMI) 19 or less, adult: Secondary | ICD-10-CM | POA: Diagnosis not present

## 2022-05-11 DIAGNOSIS — E785 Hyperlipidemia, unspecified: Secondary | ICD-10-CM | POA: Diagnosis not present

## 2022-05-11 DIAGNOSIS — Z9981 Dependence on supplemental oxygen: Secondary | ICD-10-CM | POA: Diagnosis not present

## 2022-05-11 DIAGNOSIS — L89892 Pressure ulcer of other site, stage 2: Secondary | ICD-10-CM | POA: Diagnosis not present

## 2022-05-11 DIAGNOSIS — I959 Hypotension, unspecified: Secondary | ICD-10-CM | POA: Diagnosis not present

## 2022-05-11 DIAGNOSIS — R339 Retention of urine, unspecified: Secondary | ICD-10-CM | POA: Diagnosis not present

## 2022-05-11 DIAGNOSIS — Z950 Presence of cardiac pacemaker: Secondary | ICD-10-CM | POA: Diagnosis not present

## 2022-05-11 DIAGNOSIS — L97519 Non-pressure chronic ulcer of other part of right foot with unspecified severity: Secondary | ICD-10-CM | POA: Diagnosis not present

## 2022-05-11 DIAGNOSIS — I482 Chronic atrial fibrillation, unspecified: Secondary | ICD-10-CM | POA: Diagnosis not present

## 2022-05-11 LAB — BASIC METABOLIC PANEL
Anion gap: 9 (ref 5–15)
BUN: <5 mg/dL — ABNORMAL LOW (ref 8–23)
CO2: 27 mmol/L (ref 22–32)
Calcium: 7.2 mg/dL — ABNORMAL LOW (ref 8.9–10.3)
Chloride: 96 mmol/L — ABNORMAL LOW (ref 98–111)
Creatinine, Ser: 0.42 mg/dL — ABNORMAL LOW (ref 0.44–1.00)
GFR, Estimated: >60 mL/min (ref >60)
Glucose, Bld: 257 mg/dL — ABNORMAL HIGH (ref 70–99)
Potassium: 4 mmol/L (ref 3.5–5.1)
Sodium: 132 mmol/L — ABNORMAL LOW (ref 135–145)

## 2022-05-11 LAB — TYPE AND SCREEN
ABO/RH(D): A POS
Antibody Screen: NEGATIVE
Unit division: 0

## 2022-05-11 LAB — BPAM RBC
Blood Product Expiration Date: 202401232359
Unit Type and Rh: 6200

## 2022-05-11 LAB — CBC
HCT: 25.8 % — ABNORMAL LOW (ref 36.0–46.0)
Hemoglobin: 8.2 g/dL — ABNORMAL LOW (ref 12.0–15.0)
MCH: 26.3 pg (ref 26.0–34.0)
MCHC: 31.8 g/dL (ref 30.0–36.0)
MCV: 82.7 fL (ref 80.0–100.0)
Platelets: 267 10*3/uL (ref 150–400)
RBC: 3.12 MIL/uL — ABNORMAL LOW (ref 3.87–5.11)
RDW: 15 % (ref 11.5–15.5)
WBC: 8 10*3/uL (ref 4.0–10.5)
nRBC: 0 % (ref 0.0–0.2)

## 2022-05-11 LAB — GLUCOSE, CAPILLARY
Glucose-Capillary: 136 mg/dL — ABNORMAL HIGH (ref 70–99)
Glucose-Capillary: 222 mg/dL — ABNORMAL HIGH (ref 70–99)
Glucose-Capillary: 229 mg/dL — ABNORMAL HIGH (ref 70–99)
Glucose-Capillary: 162 mg/dL — ABNORMAL HIGH (ref 70–99)

## 2022-05-11 LAB — PHOSPHORUS: Phosphorus: 1.8 mg/dL — ABNORMAL LOW (ref 2.5–4.6)

## 2022-05-11 LAB — MAGNESIUM: Magnesium: 1.3 mg/dL — ABNORMAL LOW (ref 1.7–2.4)

## 2022-05-11 MED ORDER — K PHOS MONO-SOD PHOS DI & MONO 155-852-130 MG PO TABS
500.0000 mg | ORAL_TABLET | Freq: Once | ORAL | Status: AC
  Administered 2022-05-11: 500 mg via ORAL
  Filled 2022-05-11: qty 2

## 2022-05-11 MED ORDER — MAGNESIUM OXIDE -MG SUPPLEMENT 400 (240 MG) MG PO TABS: 400.0000 mg | ORAL_TABLET | Freq: Two times a day (BID) | ORAL | 0 refills | Status: AC

## 2022-05-11 MED ORDER — BISACODYL 10 MG RE SUPP: 10.0000 mg | Freq: Every day | RECTAL | Status: DC | PRN

## 2022-05-11 MED ORDER — INSULIN GLARGINE-YFGN 100 UNIT/ML ~~LOC~~ SOLN
18.0000 [IU] | Freq: Every day | SUBCUTANEOUS | Status: DC
  Administered 2022-05-11: 18 [IU] via SUBCUTANEOUS
  Filled 2022-05-11 (×2): qty 0.18

## 2022-05-11 MED ORDER — MAGNESIUM OXIDE -MG SUPPLEMENT 400 (240 MG) MG PO TABS
400.0000 mg | ORAL_TABLET | Freq: Two times a day (BID) | ORAL | Status: DC
  Administered 2022-05-11: 400 mg via ORAL
  Filled 2022-05-11: qty 1

## 2022-05-11 MED ORDER — LACTULOSE 10 GM/15ML PO SOLN
20.0000 g | Freq: Every day | ORAL | Status: DC
  Administered 2022-05-11: 20 g via ORAL
  Filled 2022-05-11: qty 30

## 2022-05-11 MED ORDER — MAGNESIUM SULFATE 2 GM/50ML IV SOLN
2.0000 g | Freq: Once | INTRAVENOUS | Status: AC
  Administered 2022-05-11: 2 g via INTRAVENOUS
  Filled 2022-05-11: qty 50

## 2022-05-11 MED ORDER — DOCUSATE SODIUM 100 MG PO CAPS
100.0000 mg | ORAL_CAPSULE | Freq: Every day | ORAL | Status: DC
  Administered 2022-05-11: 100 mg via ORAL
  Filled 2022-05-11: qty 1

## 2022-05-11 MED ORDER — FLUTICASONE FUROATE-VILANTEROL 100-25 MCG/ACT IN AEPB
1.0000 | INHALATION_SPRAY | Freq: Every day | RESPIRATORY_TRACT | Status: DC
  Filled 2022-05-11: qty 28

## 2022-05-11 MED ORDER — SACCHAROMYCES BOULARDII 250 MG PO CAPS
250.0000 mg | ORAL_CAPSULE | Freq: Two times a day (BID) | ORAL | Status: DC
  Administered 2022-05-11: 250 mg via ORAL
  Filled 2022-05-11: qty 1

## 2022-05-11 MED ORDER — POTASSIUM PHOSPHATES 15 MMOLE/5ML IV SOLN
15.0000 mmol | Freq: Once | INTRAVENOUS | Status: DC
  Filled 2022-05-11: qty 5

## 2022-05-11 MED ORDER — GERHARDT'S BUTT CREAM: 1.0000 | TOPICAL_CREAM | Freq: Three times a day (TID) | CUTANEOUS | Status: AC

## 2022-05-11 MED ORDER — THIAMINE MONONITRATE 100 MG PO TABS
100.0000 mg | ORAL_TABLET | Freq: Every day | ORAL | Status: DC
  Administered 2022-05-11: 100 mg via ORAL
  Filled 2022-05-11: qty 1

## 2022-05-11 MED ORDER — K PHOS MONO-SOD PHOS DI & MONO 155-852-130 MG PO TABS: 500.0000 mg | ORAL_TABLET | Freq: Two times a day (BID) | ORAL | 0 refills | Status: AC

## 2022-05-11 MED ORDER — ADULT MULTIVITAMIN W/MINERALS CH
1.0000 | ORAL_TABLET | Freq: Every day | ORAL | Status: DC
  Administered 2022-05-11: 1 via ORAL
  Filled 2022-05-11: qty 1

## 2022-05-11 MED ORDER — PRAVASTATIN SODIUM 40 MG PO TABS
20.0000 mg | ORAL_TABLET | Freq: Every day | ORAL | Status: DC
  Administered 2022-05-11: 20 mg via ORAL
  Filled 2022-05-11: qty 1

## 2022-05-11 MED ORDER — ENSURE ENLIVE PO LIQD
237.0000 mL | Freq: Two times a day (BID) | ORAL | Status: DC
  Administered 2022-05-11: 237 mL via ORAL

## 2022-05-11 MED ORDER — FLUOROMETHOLONE 0.1 % OP SUSP
1.0000 [drp] | Freq: Three times a day (TID) | OPHTHALMIC | Status: DC
  Administered 2022-05-11 (×2): 1 [drp] via OPHTHALMIC
  Filled 2022-05-11: qty 5

## 2022-05-11 NOTE — TOC Transition Note (Signed)
Transition of Care [facility]) - CM/SW Discharge Note   Patient Details  Name: [name] MRN: [id number] Date of Birth: 1935/08/24  Transition of Care [facility] CM/SW Contact:  Joanne Chars, LCSW Phone Number: 05/11/2022, 1:38 PM   Clinical Narrative:   Pt discharging to [place], room 205.  RN call report [phone].  Legal guardian Samule Ohm of [place] has been informed of DC.  Final next level of care: [place] Barriers to Discharge: No Barriers Identified   Patient Goals and CMS Choice   Choice offered to / list presented to : [facility] POA / Guardian (DSS social worker: Aleene Davidson)  Discharge Placement                Patient chooses bed at:  [facility]) Patient to be transferred to facility by: [place] Name of family member notified: Reeves Forth Smith-legal guardian Patient and family notified of of transfer: 05/11/22  Discharge Plan and Services Additional resources added to the After Visit Summary for   In-house Referral: Clinical Social Work   Post Acute Care Choice: [place]                               Social Determinants of Health (SDOH) Interventions SDOH Screenings   Food Insecurity: No Food Insecurity (01/16/2022)  Housing: Low Risk  (01/16/2022)  Transportation Needs: No Transportation Needs (01/16/2022)  Recent Concern: Transportation Needs - Unmet Transportation Needs (10/24/2021)  Utilities: Not At Risk (01/16/2022)  Depression (PHQ2-9): Low Risk  (10/24/2021)  Financial Resource Strain: Low Risk  (10/24/2021)  Social Connections: Moderately Isolated (02/17/2020)  Stress: No Stress Concern Present (04/28/2020)  Tobacco Use: Medium Risk (05/07/2022)     Readmission Risk Interventions     No data to display

## 2022-05-11 NOTE — Progress Notes (Signed)
Patient ID: [name], female   DOB: 11-09-1935, 87 y.o.   MRN: [id number]    Subjective: No new complaints.  Objective: Vital signs in last 24 hours: Temp:  [98.1 F (36.7 C)-98.6 F (37 C)] 98.2 F (36.8 C) (01/04 0511) Pulse Rate:  [82-86] 84 (01/04 0511) Resp:  [18-20] 20 (01/04 0511) BP: (104-115)/(41-77) 112/45 (01/04 0511) SpO2:  [94 %-99 %] 99 % (01/04 0511)  Intake/Output from previous day: 01/03 0701 - 01/04 0700 In: 386.4 [I.V.:386.4] Out: 820 [Urine:820] Intake/Output this shift: No intake/output data recorded.  Physical Exam:  General: Alert and oriented GU: Urine clear  Lab Results: Recent Labs    05/08/22 1247 05/09/22 0427 05/10/22 0254  HGB 8.9* 8.4* 7.8*  HCT 28.6* 26.6* 24.8*   BMET Recent Labs    05/09/22 0427 05/10/22 0254  NA 132* 130*  K 4.1 3.4*  CL 98 96*  CO2 25 28  GLUCOSE 93 134*  BUN 7* <5*  CREATININE 0.50 0.41*  CALCIUM 8.0* 7.4*     Studies/Results: US RENAL  Result Date: 05/10/2022 CLINICAL DATA:  Hydronephrosis EXAM: RENAL / URINARY TRACT ULTRASOUND COMPLETE COMPARISON:  CT 04/08/2023.  Ultrasound 04/26/2022 FINDINGS: Right Kidney: Renal measurements: 11.5 x 5.4 x 6.8 = volume: 217.5 mL. Preserved echotexture. Stable moderate collecting system dilatation. Trace perinephric fluid Left Kidney: Renal measurements: 10.0 x 6.2 x 5.5 = volume: 176.1 mL. Preserved echotexture. Again moderate collecting system dilatation, similar to prior ultrasound Bladder: Bladder is contracted with a Foley. There does appear to be some wall thickening. Other: Aorta and IVC are not well imaged. IMPRESSION: Persistent moderate bilateral renal collecting system dilatation, right slightly worse than left. Foley catheter in the contracted urinary bladder. Electronically Signed   By: Jill Side M.D.   On: 05/10/2022 10:23    Assessment/Plan: 1) Hematuria: Urine has now cleared off CBI.  Etiology most likely related to infection but will need  outpatient cystoscopy following hospitalization.  Continue empiric antibiotic therapy  2) Hydronephrosis:  Renal ultrasound still with hydronephrosis but no concerning etiology of obstruction on CT and renal function normal.  This may be related to recent retention.  Will consider repeating ultrasound later in hospitalization or as an outpatient when she follows up for cystoscopy.  Would leave catheter for now and may need to keep upon discharge to avoid repeated pyelonephritis.     LOS: 4 days   Dutch Gray 05/11/2022, 7:03 AM

## 2022-05-11 NOTE — TOC Initial Note (Signed)
Transition of Care [facility]) - Initial/Assessment Note    Patient Details  Name: [name] MRN: [id number] Date of Birth: 1935/08/23  Transition of Care [facility] CM/SW Contact:    Joanne Chars, LCSW Phone Number: 05/11/2022, 1:19 PM  Clinical Narrative:   Pt oriented x2, CSW spoke with her briefly and she remains confused. CSW spoke with APS social worker/legal guardian Reeves Forth Smith/[place] [employer].  Requested copy of guardianship order and Reeves Forth agreed to email tomorrow when she is back in the office.  Camilla aware that pt at hospital, informed her of DC today, she does want pt to return to [facility].  CSW confirmed with Kristal/[facility] that they can receive pt today.                Expected Discharge Plan: [place] Barriers to Discharge: No Barriers Identified   Patient Goals and CMS Choice     Choice offered to / list presented to : [facility] POA / Guardian (DSS social worker: Aleene Davidson)      Expected Discharge Plan and Services In-house Referral: Clinical Social Work   Post Acute Care Choice: [place] Living arrangements for the past 2 months: [place] Expected Discharge Date: 05/11/22                                    Prior Living Arrangements/Services Living arrangements for the past 2 months: [place] Lives with:: Facility Resident Patient language and need for interpreter reviewed:: Yes        Need for Family Participation in Patient Care: Yes (Comment) Care giver support system in place?: Yes (comment) Current home services: Other (comment) (none) Criminal Activity/Legal Involvement Pertinent to Current Situation/Hospitalization: No - Comment as needed  Activities of Daily Living      Permission Sought/Granted                  Emotional Assessment Appearance:: Appears stated age Attitude/Demeanor/Rapport: Engaged Affect (typically observed): Anxious Orientation: :  Oriented to Self, Oriented to Place      Admission diagnosis:  Acute blood loss anemia [D62] Hyponatremia [E87.1] Hematuria [R31.9] Patient Active Problem List   Diagnosis Date Noted   Hematuria 05/07/2022   ABLA (acute blood loss anemia) 09/38/1829   Acute metabolic encephalopathy 93/71/6967   History of DVT (deep vein thrombosis) 05/07/2022   Bilateral hydronephrosis 05/07/2022   Chronic anticoagulation 05/07/2022   Hydroureteronephrosis 04/26/2022   Bacteremia 04/12/2022   Chronic osteomyelitis of left foot ([place]) 04/12/2022   Constipation 04/12/2022   T12 vertebral fracture ([place]) 04/04/2022   Peripheral arterial disease ([place]) 03/22/2022   Osteomyelitis of fifth toe of left foot ([place]) 03/14/2022   Urinary frequency 03/01/2022   Hyperglycemia due to type 2 diabetes mellitus ([place]) 01/10/2022   History of CVA (cerebrovascular accident) 01/10/2022   Dementia ([name]) 12/09/2021   Polycythemia 12/09/2021   Fall    Stricture and stenosis of esophagus    Abnormal esophagram    Dysphagia    Protein-calorie malnutrition, severe 10/03/2021   Diabetic foot ulcers ([place]) - right foot 10/02/2021   Seborrheic dermatitis 10/12/2020   Pain due to onychomycosis of toenails of both feet 09/01/2020   Sjogren's syndrome with keratoconjunctivitis sicca ([place]) 06/07/2020   Mild cognitive impairment with memory loss 01/29/2020   Full code status 01/26/2020   Pacemaker 01/19/2020   Leukocytosis 01/19/2020   Pressure injury of skin 01/14/2020  Complete heart block (HCC)    Lacks decision making capacity    Acute on chronic respiratory failure with hypoxia (HCC)    Scoliosis due to degenerative disease of spine in adult patient 09/10/2018   Radicular pain of left lower extremity 09/10/2018   Hand arthritis 10/11/2017   Uncontrolled type 2 diabetes mellitus with hyperglycemia, with long-term current use of insulin ([place]) 03/08/2017   Thrombocytopenia ([name]) 11/08/2016   Degenerative lumbar spinal  stenosis 10/27/2015   Arthralgia 09/22/2015   Chronic respiratory failure with hypoxia ([place]) 01/11/2015   Diabetic polyneuropathy associated with type 2 diabetes mellitus ([place]) 11/16/2014   Hypertension 05/19/2013   Vertigo 05/11/2013   Dizziness 05/11/2013   Hyperlipidemia 02/15/2013   Uncontrolled type 2 diabetes mellitus with hyperglycemia ([place]) 01/01/2013   Benign paroxysmal positional vertigo 08/09/2012   Type II or unspecified type diabetes mellitus with neurological manifestations, uncontrolled(250.62) 08/09/2012   GLAUCOMA 03/16/2010   COPD mixed type with chronic respiratory failure  01/15/2009   Seasonal and perennial allergic rhinitis 11/23/2007   CHRONIC RHINITIS 07/16/2007   GERD (gastroesophageal reflux disease) 05/29/2007   I B S-DIARRHEAL PREDOMINATE 05/29/2007   FATTY LIVER DISEASE 05/29/2007   PCP:  Wardell Honour, MD Pharmacy:   [facility] [place] [facility] Registered [address] [place] [address] Phone: [phone] Fax: [place], [place] - [address] [place] [address] Phone: [phone] Fax: [phone]     Social Determinants of Health (SDOH) Social History: SDOH Screenings   Food Insecurity: No Food Insecurity (01/16/2022)  Housing: Low Risk  (01/16/2022)  Transportation Needs: No Transportation Needs (01/16/2022)  Recent Concern: Transportation Needs - Unmet Transportation Needs (10/24/2021)  Utilities: Not At Risk (01/16/2022)  Depression (PHQ2-9): Low Risk  (10/24/2021)  Financial Resource Strain: Low Risk  (10/24/2021)  Social Connections: Moderately Isolated (02/17/2020)  Stress: No Stress Concern Present (04/28/2020)  Tobacco Use: Medium Risk (05/07/2022)   SDOH Interventions:     Readmission Risk Interventions     No data to display

## 2022-05-11 NOTE — Plan of Care (Signed)
  Problem: Fluid Volume: Goal: Ability to maintain a balanced intake and output will improve Outcome: Progressing   Problem: Nutritional: Goal: Maintenance of adequate nutrition will improve Outcome: Progressing   Problem: Skin Integrity: Goal: Risk for impaired skin integrity will decrease Outcome: Progressing   Problem: Tissue Perfusion: Goal: Adequacy of tissue perfusion will improve Outcome: Progressing   

## 2022-05-11 NOTE — Discharge Summary (Addendum)
Physician Discharge Summary  EDILIA GHUMAN ZHG:[id number] DOB: 11-03-1935 DOA: 05/07/2022  PCP: Wardell Honour, MD  Admit date: 05/07/2022 Discharge date: 05/11/2022  Admitted From: Skilled nursing facility  Discharge disposition: Skilled nursing facility   Recommendations for Outpatient Follow-Up:   Follow up with your primary care provider in one week.  Check CBC, BMP, magnesium in the next visit Patient is on Augmentin twice daily until 05/24/2022 infectious disease for osteomyelitis.  This will need to be continued. Patient will need to follow-up with Dr. Alinda Money urology in 1 to 2 weeks to discuss about Foley catheter/cystoscopy as outpatient. Eliquis has been discontinued at this time.  Discharge Diagnosis:   Principal Problem:   Hematuria Active Problems:   ABLA (acute blood loss anemia)   Acute metabolic encephalopathy   Leukocytosis   Bilateral hydronephrosis   History of DVT (deep vein thrombosis)   Chronic anticoagulation   Hypertension   COPD mixed type with chronic respiratory failure    Chronic respiratory failure with hypoxia (HCC)   Uncontrolled type 2 diabetes mellitus with hyperglycemia (HCC)   Chronic osteomyelitis of left foot ([place])   Complete heart block ([place])   Pacemaker   Discharge Condition: Improved.  Diet recommendation: Low sodium, heart healthy.  Carbohydrate-modified.    Wound care: None.  Code status: DNR   History of Present Illness:   [name] is a 87 y.o. female with past medical history of complete heart block status post permanent pacemaker placement, chronic respiratory failure on 2 to 3 L of oxygen at baseline, diabetes mellitus type 2, DVT on chronic anticoagulation,  frequent UTIs, chronic left foot osteomyelitis presented to the hospital with large amount of vaginal bleeding.  Subsequently, patient was thought to have be gross hematuria.  Patient did have some mild encephalopathy.  Urology was consulted.  Patient  was then admitted to the hospital for further evaluation and treatment.   Hospital Course:   Following conditions were addressed during hospitalization as listed below,  Acute blood loss anemia secondary to hematuria Hemoglobin today at 8.2.  Urology was consulted.  Continue Foley catheter, was on continuous bladder irrigation which has been discontinued.  Mild clots as per nursing.  Hematuria thought to be secondary to UTI.   CT scan of the abdomen and pelvis with circumferential thickening of the urinary bladder bilateral hydro and ureter nephrosis, bilateral striated nephrograms, possible fistulous communication with vagina, patient will need cystoscopy as outpatient/urethral catheter management with urology..  Plans for Eliquis on discharge.   Acute metabolic encephalopathy .  Likely secondary to infection.  Continue delirium precautions.  Currently able to communicate and oriented to place and month.   Leukocytosis  Urinary Tract Infection, concern for Pyelonephritis Patient had leukocytosis.  Was previously on Augmentin for osteomyelitis.  Has been reinitiated.  Urine culture pending.  Will continue Augmentin to complete the previous course for osteomyelitis.   Bilateral hydronephrosis Likely obstructive due to clotted blood in the bladder.  Urology was consulted and patient received Foley catheter.  Currently urine has lightened up.  Urology recommends continuation of Foley catheter on discharge with outpatient cystoscopy   Consolidation within Left Lower Lobe On the scan of the CT.  No respiratory symptoms.  Already on Augmentin.   History of DVT on chronic anticoagulation On Eliquis for left lower extremity DVT 01/14/2022. Ultrasound of LLE shows resolution of previously noted thrombus.  Patient has completed 3 months and did not have previous  DVT will consider discontinuation at discharge.  Essential hypertension Resume antihypertensives from home.  Continue metoprolol from  home.   COPD, without acute exacerbation  Chronic respiratory failure with hypoxia Patient is on 2 to 3 L of oxygen at baseline.  Continue breathing treatments on discharge.   Uncontrolled type 2 diabetes mellitus with hyperglycemia (HCC) Last hemoglobin A1c 12.9 on 04/05/22.  Continue insulin regimen from home   Chronic osteomyelitis of the left foot Patient is on Augmentin twice daily until 05/24/2022 as recommended by Dr. Daiva [place] Dam infectious disease.  Had previously refused surgery.  Currently on  Augmentin    T12 vertebral fracture Noted during 04/04/22 hospital stay.   Complete heart block s/p permanent pacemaker Stable at this time.  Disposition.  At this time, patient is stable for disposition back to her assisted living facility.  Follow-up with urology as outpatient.  Medical Consultants:   Urology  Procedures:    Foley catheter placement and continuous bladder irrigation   Subjective:   Today, patient was seen and examined at bedside.  Denies any pain, nausea, vomiting, fever, chest pain.  Foley catheter with clear urine.  Discharge Exam:   Vitals:   05/11/22 0853 05/11/22 1426  BP:  (!) 117/55  Pulse:  96  Resp:  17  Temp:  98.8 F (37.1 C)  SpO2: 94% 94%   Vitals:   05/11/22 0511 05/11/22 0732 05/11/22 0853 05/11/22 1426  BP: (!) 112/45 112/60  (!) 117/55  Pulse: 84 90  96  Resp: 20 20  17   Temp: 98.2 F (36.8 C) 98 F (36.7 C)  98.8 F (37.1 C)  TempSrc: Oral Oral  Oral  SpO2: 99% 95% 94% 94%  Weight:      Height:       General: Alert awake, not in obvious distress, Communicative, cannula oxygen, thinly built. HENT: pupils equally reacting to light,  No scleral pallor or icterus noted. Oral mucosa is moist.  Chest:  Clear breath sounds.  Diminished breath sounds bilaterally. No crackles or wheezes.  CVS: S1 &S2 heard. No murmur.  Regular rate and rhythm. Abdomen: Soft, nontender, nondistended.  Bowel sounds are heard.  Foley catheter in  place. Extremities: No cyanosis, clubbing or edema.  Peripheral pulses are palpable. Psych: Alert, awake and oriented to place and person, lacks insight. CNS:  No cranial nerve deficits.  Power equal in all extremities.  Generalized weakness noted. Skin: Warm and dry.  No rashes noted.  The results of significant diagnostics from this hospitalization (including imaging, microbiology, ancillary and laboratory) are listed below for reference.     Diagnostic Studies:   VAS US LOWER EXTREMITY VENOUS (DVT)  Result Date: 05/09/2022  Lower Venous DVT Study Patient Name:  Rene PaciSHIRLEY Y Kronk  Date of Exam:   05/08/2022 Medical Rec #: [id number]         Accession #:    [id number] Date of Birth: Apr 29, 1936         Patient Gender: F Patient Age:   [age] years [facility] Procedure:      VAS US LOWER EXTREMITY VENOUS (DVT) Referring Phys: Madelyn FlavorsONDELL SMITH --------------------------------------------------------------------------------  Indications: Gross hematuria, on Eliquis for chronic DVT, need to take patient off blood thinners.  Limitations: Pain with compression. Comparison Study: Prior left LEV done 01/11/22 indicated chronic left DVT in the                   femoral. popliteal, and peroneal veins. Performing Technologist: Sherren Kernsandace Kanady RVS  Examination Guidelines: A  complete evaluation includes B-mode imaging, spectral Doppler, color Doppler, and power Doppler as needed of all accessible portions of each vessel. Bilateral testing is considered an integral part of a complete examination. Limited examinations for reoccurring indications may be performed as noted. The reflux portion of the exam is performed with the patient in reverse Trendelenburg.  +---------+---------------+---------+-----------+----------+--------------+ RIGHT    CompressibilityPhasicitySpontaneityPropertiesThrombus Aging +---------+---------------+---------+-----------+----------+--------------+ CFV      Full            Yes      Yes                                 +---------+---------------+---------+-----------+----------+--------------+ SFJ      Full                                                        +---------+---------------+---------+-----------+----------+--------------+ FV Prox  Full                                                        +---------+---------------+---------+-----------+----------+--------------+ FV Mid   Full                                                        +---------+---------------+---------+-----------+----------+--------------+ FV DistalFull           Yes      Yes                                 +---------+---------------+---------+-----------+----------+--------------+ PFV      Full                                                        +---------+---------------+---------+-----------+----------+--------------+ POP      Full                                                        +---------+---------------+---------+-----------+----------+--------------+ PTV      Full                                                        +---------+---------------+---------+-----------+----------+--------------+ PERO     Full                                                        +---------+---------------+---------+-----------+----------+--------------+   +---------+---------------+---------+-----------+----------+--------------+  LEFT     CompressibilityPhasicitySpontaneityPropertiesThrombus Aging +---------+---------------+---------+-----------+----------+--------------+ CFV      Full           Yes      Yes                                 +---------+---------------+---------+-----------+----------+--------------+ SFJ      Full                                                        +---------+---------------+---------+-----------+----------+--------------+ FV Prox  Full                                                         +---------+---------------+---------+-----------+----------+--------------+ FV Mid   Full                                                        +---------+---------------+---------+-----------+----------+--------------+ FV DistalFull                                                        +---------+---------------+---------+-----------+----------+--------------+ PFV      Full                                                        +---------+---------------+---------+-----------+----------+--------------+ POP      Full           Yes      Yes                                 +---------+---------------+---------+-----------+----------+--------------+ PTV      Full                                                        +---------+---------------+---------+-----------+----------+--------------+ PERO     Full                                                        +---------+---------------+---------+-----------+----------+--------------+     Summary: BILATERAL: - No evidence of deep vein thrombosis seen in the lower extremities, bilaterally. -No evidence of popliteal cyst, bilaterally.  LEFT: - Findings suggest resolution of previously noted thrombus.  *See table(s) above for measurements and observations. Electronically signed by Cristal Deerhristopher  Edilia Bo MD on 05/09/2022 at 3:48:08 PM.    Final    CT ABDOMEN PELVIS W WO CONTRAST  Result Date: 05/08/2022 CLINICAL DATA:  Hematuria.  Respiratory distress. EXAM: CT ABDOMEN AND PELVIS WITHOUT AND WITH CONTRAST TECHNIQUE: Multidetector CT imaging of the abdomen and pelvis was performed following the standard protocol before and following the bolus administration of intravenous contrast. RADIATION DOSE REDUCTION: This exam was performed according to the departmental dose-optimization program which includes automated exposure control, adjustment of the mA and/or kV according to patient size and/or use of iterative reconstruction technique.  CONTRAST:  OMNIPAQUE IOHEXOL 350 MG/ML SOLN COMPARISON:  04/04/2022 FINDINGS: Lower chest: Atelectasis and consolidation identified within the left lower lobe. No pleural fluid. Hepatobiliary: No focal liver abnormality is seen. Status post cholecystectomy. No biliary dilatation. Pancreas: Unremarkable. No pancreatic ductal dilatation or surrounding inflammatory changes. Spleen: Normal in size without focal abnormality. Adrenals/Urinary Tract: Normal adrenal glands. No nephrolithiasis identified bilaterally. Bilateral hydronephrosis is identified, right greater than left. Bilateral hydroureter to the level of the urinary bladder is noted. Bilateral striated nephrograms identified concerning for pyelonephritis. No discrete mass. Diminished contrast opacification of the ureters on the delayed images. Marked diffuse circumferential wall thickening of the urinary bladder is identified which is now collapsed around a Foley catheter balloon. Assessment for underlying bladder lesion is diminished due to incomplete distension. There is a defect with along the posterior wall of the urethra with possible fistulous communication with the vagina, image 62/12. Stomach/Bowel: Stomach appears normal. The appendix is suboptimally visualized separate from the right lower quadrant bowel loops. No pathologic dilatation of the large or small bowel loops. No bowel wall thickening or inflammation. Vascular/Lymphatic: Aortic atherosclerosis. No aneurysm. No signs of abdominopelvic adenopathy. Reproductive: Status post hysterectomy. No adnexal masses. Other: No free fluid or fluid collections identified. Musculoskeletal: Fracture deformity involving the superior endplate of T12 is identified in appears stable from a 04/06/22. Degenerative disc disease identified within the lower lumbar spine. IMPRESSION: 1. Marked diffuse circumferential wall thickening of the urinary bladder which is now collapsed around a Foley catheter balloon.  Assessment for underlying bladder lesion is diminished due to incomplete distension. 2. Bilateral hydronephrosis and hydroureter to the level of the urinary bladder. Findings may reflect sequelae of chronic bladder outlet obstruction. 3. Bilateral striated nephrograms concerning for pyelonephritis. 4. There is a defect with along the posterior wall of the urethra with possible fistulous communication with the vagina. Clinical correlation advised. 5. Atelectasis and consolidation within the left lower lobe. New from 04/04/2022 6. Stable T12 compression fracture. 7.  Aortic Atherosclerosis (ICD10-I70.0). Electronically Signed   By: Signa Kell M.D.   On: 05/08/2022 11:02     Labs:   Basic Metabolic Panel: Recent Labs  Lab 05/07/22 1315 05/08/22 1247 05/09/22 0427 05/10/22 0254 05/11/22 0948  NA 128* 130* 132* 130* 132*  K 4.3 3.9 4.1 3.4* 4.0  CL 93* 96* 98 96* 96*  CO2 27 23 25 28 27   GLUCOSE 112* 86 93 134* 257*  BUN 17 11 7* <5* <5*  CREATININE 0.75 0.54 0.50 0.41* 0.42*  CALCIUM 8.4* 7.9* 8.0* 7.4* 7.2*  MG  --   --  1.5* 1.7 1.3*  PHOS  --   --  2.3* 1.5* 1.8*   GFR Estimated Creatinine Clearance: 43.3 mL/min (A) (by C-G formula based on SCr of 0.42 mg/dL (L)). Liver Function Tests: Recent Labs  Lab 05/07/22 1315 05/09/22 0427 05/10/22 0254  AST 32 24 20  ALT 13  12 12  ALKPHOS 56 49 43  BILITOT 0.1* 0.4 0.2*  PROT 6.2* 5.6* 5.0*  ALBUMIN 2.1* 1.9* 1.7*   No results for input(s): "LIPASE", "AMYLASE" in the last 168 hours. No results for input(s): "AMMONIA" in the last 168 hours. Coagulation profile No results for input(s): "INR", "PROTIME" in the last 168 hours.  CBC: Recent Labs  Lab 05/07/22 1246 05/07/22 1833 05/08/22 1247 05/09/22 0427 05/10/22 0254 05/11/22 0948  WBC 11.4*  --  9.7 9.0 6.8 8.0  NEUTROABS 8.6*  --   --  6.4 4.6  --   HGB 9.2* 9.3* 8.9* 8.4* 7.8* 8.2*  HCT 27.8* 29.0* 28.6* 26.6* 24.8* 25.8*  MCV 82.5  --  85.6 85.3 84.4 82.7  PLT  304  --  262 267 261 267   Cardiac Enzymes: No results for input(s): "CKTOTAL", "CKMB", "CKMBINDEX", "TROPONINI" in the last 168 hours. BNP: Invalid input(s): "POCBNP" CBG: Recent Labs  Lab 05/10/22 1600 05/10/22 2134 05/11/22 0735 05/11/22 1207 05/11/22 1623  GLUCAP 199* 259* 222* 229* 162*   D-Dimer No results for input(s): "DDIMER" in the last 72 hours. Hgb A1c No results for input(s): "HGBA1C" in the last 72 hours. Lipid Profile No results for input(s): "CHOL", "HDL", "LDLCALC", "TRIG", "CHOLHDL", "LDLDIRECT" in the last 72 hours. Thyroid function studies No results for input(s): "TSH", "T4TOTAL", "T3FREE", "THYROIDAB" in the last 72 hours.  Invalid input(s): "FREET3" Anemia work up No results for input(s): "VITAMINB12", "FOLATE", "FERRITIN", "TIBC", "IRON", "RETICCTPCT" in the last 72 hours. Microbiology No results found for this or any previous visit (from the past 240 hour(s)).   Discharge Instructions:   Discharge Instructions     Diet Carb Modified   Complete by: As directed    Discharge instructions   Complete by: As directed    Continue Foley catheter on discharge.  Follow-up with your primary care provider at the skilled nursing facility in 3 to 5 days.  Follow-up with Dr. Laverle PatterBorden urology as outpatient in 1 to 2 weeks to discuss about outpatient cystoscopy/management of Foley catheter.  Seek medical attention for worsening symptoms.   Increase activity slowly   Complete by: As directed    No wound care   Complete by: As directed       Allergies as of 05/11/2022       Reactions   Chlordiazepoxide-clidinium Other (See Comments)   Sleepy,weak- "Allergic," per [facility]   Biaxin [clarithromycin] Diarrhea, Other (See Comments)   Foul taste, abd pain, diarrhea- "Allergic," per [facility]   Chlordiazepoxide Other (See Comments)   "[facility]," [facility]   Tape Other (See Comments)   SKIN IS VERY THIN AND WILL TEAR AND BRUISE EASILY!! "Allergic," per [facility]   Ultram [tramadol]  Other (See Comments)   Caused tremors   Codeine Nausea Only, Other (See Comments)   Upset the stomach- "Allergic," per [facility]        Medication List     STOP taking these medications    apixaban 5 MG Tabs tablet Commonly known as: ELIQUIS   cefTRIAXone 1 g injection Commonly known as: ROCEPHIN   insulin glargine-yfgn 100 UNIT/ML injection Commonly known as: SEMGLEE       TAKE these medications    acetaminophen 325 MG tablet Commonly known as: TYLENOL Take 2 tablets (650 mg total) by mouth every 6 (six) hours as needed for mild pain (or Fever >/= 101). What changed:  when to take this additional instructions   albuterol 108 (90 Base) MCG/ACT inhaler Commonly known  as: VENTOLIN HFA Inhale 1-2 puffs into the lungs every 6 (six) hours as needed for wheezing or shortness of breath. What changed: reasons to take this   amoxicillin-clavulanate 875-125 MG tablet Commonly known as: AUGMENTIN Take 1 tablet by mouth every 12 (twelve) hours. What changed: additional instructions   bisacodyl 10 MG suppository Commonly known as: DULCOLAX Place 1 suppository (10 mg total) rectally daily as needed for moderate constipation. What changed:  when to take this additional instructions   docusate sodium 100 MG capsule Commonly known as: COLACE Take 100 mg by mouth in the morning.   feeding supplement Liqd Take 237 mLs by mouth 2 (two) times daily between meals.   fluorometholone 0.1 % ophthalmic suspension Commonly known as: FML Place 1 drop into both eyes 3 (three) times daily.   Gerhardt's butt cream Crea Apply 1 Application topically 3 (three) times daily.   insulin aspart 100 UNIT/ML injection Commonly known as: novoLOG Inject 0-15 Units into the skin 3 (three) times daily with meals. CBG < 70: Implement Hypoglycemia Standing Orders and refer to Hypoglycemia Standing Orders sidebar report CBG 70 - 120: 0 units CBG 121 - 150: 2 units CBG 151 - 200: 3 units CBG 201 - 250:  5 units CBG 251 - 300: 8 units CBG 301 - 350: 11 units CBG 351 - 400: 15 units CBG > 400: call MD and obtain STAT lab verification What changed:  when to take this additional instructions   ipratropium-albuterol 0.5-2.5 (3) MG/3ML Soln Commonly known as: DUONEB Use 1 vial in nebulizer every 8 hours and as needed What changed:  how much to take how to take this when to take this additional instructions   lactulose 10 GM/15ML solution Commonly known as: CHRONULAC Take 30 mLs (20 g total) by mouth daily. What changed: additional instructions   levalbuterol 45 MCG/ACT inhaler Commonly known as: XOPENEX HFA Inhale 1-2 puffs into the lungs every 6 (six) hours as needed for wheezing.   magnesium oxide 400 (240 Mg) MG tablet Commonly known as: MAG-OX Take 1 tablet (400 mg total) by mouth 2 (two) times daily for 10 days.   metoprolol succinate 25 MG 24 hr tablet Commonly known as: TOPROL-XL Take 1 tablet (25 mg total) by mouth daily.   multivitamin with minerals Tabs tablet Take 1 tablet by mouth daily.   Myrbetriq 50 MG Tb24 tablet Generic drug: mirabegron ER TAKE 1 TABLET DAILY   OXYGEN Inhale 3 L/min into the lungs continuous.   pantoprazole 40 MG tablet Commonly known as: Protonix Take 1 tablet (40 mg total) by mouth daily.   phosphorus 155-852-130 MG tablet Commonly known as: K PHOS NEUTRAL Take 2 tablets (500 mg total) by mouth 2 (two) times daily for 3 days.   pravastatin 20 MG tablet Commonly known as: PRAVACHOL Take 1 tablet (20 mg total) by mouth daily. What changed: when to take this   protective barrier Crea Apply 1 application  topically See admin instructions. Apply to sacrum, buttocks, and peri area EVERY 12 HOURS   saccharomyces boulardii 250 MG capsule Commonly known as: FLORASTOR Take 250 mg by mouth 2 (two) times daily.   Semglee 100 UNIT/ML injection Generic drug: insulin glargine Inject 18 Units into the skin in the morning.   Stiolto  Respimat 2.5-2.5 MCG/ACT Aers Generic drug: Tiotropium Bromide-Olodaterol Inhale 2 puffs into the lungs daily.   thiamine 100 MG tablet Commonly known as: VITAMIN B1 Take 1 tablet (100 mg total) by mouth daily.  Follow-up Information     [name], MD .   Specialties: Family Medicine, Emergency Medicine Contact information: [address] [place] [address] [phone]         Heloise Purpura, MD Follow up in 1 week(s).   Specialty: Urology Why: plan for cystoscopy, Foley catheter management. Contact information: [address] [place] [address] [phone]                  Time coordinating discharge: 39 minutes  Signed:  Mar Walmer  [facility] 05/11/2022, 4:54 PM

## 2022-05-11 NOTE — Progress Notes (Signed)
[name], [name] ([id number]) [id number] Page 1 of 8 Visit Report for 02/16/2022 Chief Complaint Document Details Patient Name: Date of Service: [name], [name]. 02/16/2022 3:15 PM Medical Record Number: [id number] Patient Account Number: [id number] Date of Birth/Sex: Treating RN: 11-27-1935 (87 [name].o. F) Primary Care Provider: Jacalyn LefevreMiller , [name] Other Clinician: Referring Provider: Treating Provider/Extender: [name], [name] , [name] in Treatment: 1 Information Obtained from: Patient Chief Complaint 02/09/2022; left foot wounds Electronic Signature(s) Signed: 02/16/2022 4:46:31 PM By: Geralyn CorwinHoffman, [name] Entered By: Geralyn CorwinHoffman, [name] on 02/16/2022 16:40:20 -------------------------------------------------------------------------------- HPI Details Patient Name: Date of Service: [name], [name]. 02/16/2022 3:15 PM Medical Record Number: [id number] Patient Account Number: [id number] Date of Birth/Sex: Treating RN: 11-27-1935 (87 [name].o. F) Primary Care Provider: Jacalyn LefevreMiller , [name] Other Clinician: Referring Provider: Treating Provider/Extender: [name], Erice Ahles [name] , [name] in Treatment: 1 History of Present Illness HPI Description: Admission 02/09/2022 [name] is an 87 year old female with a past medical history of uncontrolled insulin-dependent type 2 diabetes with most recent hemoglobin A1c of 13 and complicated by peripheral neuropathy, COPD, and CVA that presents to the clinic for a 1 to 6726-month history of nonhealing ulcers to the left foot. She has visited her primary care physician for this issue on 02/01/2022 and was started on Augmentin for left lower extremity cellulitis and wound infection. Caregivers present in the room. Per patient and caregiver her left lower extremity cellulitis has improved. She still has some slight erythema to the dorsal aspect of the foot. She reports pain to the wound sites. She had an ABI  with TBI's done on 01/11/2022 that showed a TBI of 0.15 on the left and monophasic PTA and DP. She also had an x-ray of the left foot on 01/10/2022 that showed soft tissue swelling with no acute bony abnormality. She currently denies systemic signs of infection. 02/16/2022; Patient's left great toe and left medial wound has healed. She is scheduled for an MRI at the end of November. She has been using Medihoney to the wound beds. She is currently on Augmentin and has 1 more day to complete her course. She still has mild swelling with mild erythema and pain to the wound site on the left lateral foot. Electronic Signature(s) Signed: 02/16/2022 4:46:31 PM By: Geralyn CorwinHoffman, [name] Entered By: Geralyn CorwinHoffman, Breeona Waid on 02/16/2022 16:43:14 Paring/cutting of benign hyperkeratotic lesion 2-4 Details -------------------------------------------------------------------------------- [name], [name] ([id number]) [id number] Page 2 of 8 Patient Name: Date of Service: [name], [name]. 02/16/2022 3:15 PM Medical Record Number: [id number] Patient Account Number: [id number] Date of Birth/Sex: Treating RN: 11-27-1935 (87 [name].o. [name] RowanF) [name], [name] Primary Care Provider: Jacalyn LefevreMiller , [name] Other Clinician: Referring Provider: Treating Provider/Extender: [name], Aishi Courts [name] , [name] in Treatment: 1 Procedure Performed for: Wound #1 Left,Lateral,Plantar Foot Performed By: Physician Geralyn CorwinHoffman, Bobbijo Holst, [name] Post Procedure Diagnosis Same as Pre-procedure Notes trim dead skin Electronic Signature(s) Signed: 02/16/2022 4:46:31 PM By: Geralyn CorwinHoffman, Heath Badon [name] Signed: 05/10/2022 5:36:43 PM By: [name], [name] RN Entered By: [name], [name] on 02/16/2022 16:40:38 -------------------------------------------------------------------------------- Physical Exam Details Patient Name: Date of Service: [name], [name]. 02/16/2022 3:15 PM Medical Record Number: [id number] Patient Account Number:  [id number] Date of Birth/Sex: Treating RN: 11-27-1935 (87 [name].o. F) Primary Care Provider: Jacalyn LefevreMiller , [name] Other Clinician: Referring Provider: Treating Provider/Extender: [name], Rocklyn Mayberry [name] , [name] in Treatment: 1 Constitutional respirations regular, non-labored and within target range for patient.. Cardiovascular 2+ dorsalis pedis/posterior tibialis pulses. Psychiatric pleasant and cooperative. Notes Left foot: 1 wound remaining to the  lateral aspect with nonviable tissue throughout. Mild erythema and swelling to the site. Pain on palpation. Electronic Signature(s) Signed: 02/16/2022 4:46:31 PM By: Kalman Shan [name] Entered By: Kalman Shan on 02/16/2022 16:43:55 -------------------------------------------------------------------------------- Physician Orders Details Patient Name: Date of Service: [name] 02/16/2022 3:15 PM Medical Record Number: [id number] Patient Account Number: [id number] Date of Birth/Sex: Treating RN: March 15, 1936 (16 [name].o. [name], [name] Primary Care Provider: Alain [name] Other Clinician: Referring Provider: Treating Provider/Extender: Cathleen Fears in Treatment: 1 Verbal / Phone Orders: No [name] ([id number]) [id number] Page 3 of 8 Diagnosis Coding Follow-up Appointments ppointment in 1 week. - w/ Dr. Heber [place] and Elias [name] # 9 Return A Anesthetic (In clinic) Topical Lidocaine 5% applied to wound bed Bathing/ Shower/ Hygiene May shower with protection but [name] not get wound dressing(s) wet. Edema Control - Lymphedema / SCD / Other Elevate legs to the level of the heart or above for 30 minutes daily and/or when sitting, a frequency of: Avoid standing for long periods of time. Off-Loading Other: - Keep pressure off of Left foot Wound Treatment Wound #1 - Foot Wound Laterality: Plantar, Left, Lateral Cleanser: Soap and Water 1 x Per Day/15  Days Discharge Instructions: May shower and wash wound with dial antibacterial soap and water prior to dressing change. Cleanser: Wound Cleanser (Generic) 1 x Per Day/15 Days Discharge Instructions: Cleanse the wound with wound cleanser prior to applying a clean dressing using gauze sponges, not tissue or cotton balls. Prim Dressing: MediHoney Gel, tube 1.5 (oz) 1 x Per Day/15 Days ary Discharge Instructions: Apply to wound bed as instructed Secondary Dressing: Optifoam Non-Adhesive Dressing, 4x4 in (Generic) 1 x Per Day/15 Days Discharge Instructions: Apply over primary dressing as directed. Secondary Dressing: Woven Gauze Sponge, Non-Sterile 4x4 in (Generic) 1 x Per Day/15 Days Discharge Instructions: Apply over primary dressing as directed. Secured With: [employer], 4.5x3.1 (in/yd) (Generic) 1 x Per Day/15 Days Discharge Instructions: Secure with Kerlix as directed. Secured With: 70M Medipore H Soft Cloth Surgical T ape, 4 x 10 (in/yd) (Generic) 1 x Per Day/15 Days Discharge Instructions: Secure with tape as directed. Patient Medications llergies: Biaxin, clarithromycin, adhesive tape, tramadol, codeine A Notifications Medication Indication Start End 02/16/2022 amoxicillin-pot clavulanate DOSE 1 - oral 875 mg-125 mg tablet - 1 tablet oral BID x 10 days Electronic Signature(s) Signed: 02/16/2022 4:44:41 PM By: Kalman Shan [name] Entered By: Kalman Shan on 02/16/2022 16:44:40 -------------------------------------------------------------------------------- Problem List Details Patient Name: Date of Service: [name] 02/16/2022 3:15 PM Medical Record Number: [id number] Patient Account Number: [id number] Date of Birth/Sex: Treating RN: March 20, 1936 (16 [name].o. F) Primary Care Provider: Alain [name] Other Clinician: Referring Provider: Treating Provider/Extender: Cathleen Fears in Treatment: [address] KIRTI, CARL  ([id number]) [id number] Page 4 of 8 ICD-10 Encounter Code Description Active Date MDM Diagnosis E11.621 Type 2 diabetes mellitus with foot ulcer 02/09/2022 No Yes L97.522 Non-pressure chronic ulcer of other part of left foot with fat layer exposed 02/09/2022 No Yes L97.528 Non-pressure chronic ulcer of other part of left foot with other specified 02/09/2022 No Yes severity I73.9 Peripheral vascular disease, unspecified 02/09/2022 No Yes M79.672 Pain in left foot 02/09/2022 No Yes Inactive Problems Resolved Problems Electronic Signature(s) Signed: 02/16/2022 4:46:31 PM By: Kalman Shan [name] Entered By: Kalman Shan on 02/16/2022 16:39:24 -------------------------------------------------------------------------------- Progress Note Details Patient Name: Date of Service: [name] 02/16/2022 3:15 PM Medical Record Number: [id number] Patient Account Number:  [id number] Date of Birth/Sex: Treating RN: [birth year] (89 [name].o. F) Primary Care Provider: Jacalyn Lefevre Other Clinician: Referring Provider: Treating Provider/Extender: [name] Hurst in Treatment: 1 Subjective Chief Complaint Information obtained from Patient 02/09/2022; left foot wounds History of Present Illness (HPI) Admission 02/09/2022 Ms. Man Bonneau is an 87 year old female with a past medical history of uncontrolled insulin-dependent type 2 diabetes with most recent hemoglobin A1c of 13 and complicated by peripheral neuropathy, COPD, and CVA that presents to the clinic for a 1 to 29-month history of nonhealing ulcers to the left foot. She has visited her primary care physician for this issue on 02/01/2022 and was started on Augmentin for left lower extremity cellulitis and wound infection. Caregivers present in the room. Per patient and caregiver her left lower extremity cellulitis has improved. She still has some slight erythema to the dorsal aspect of the  foot. She reports pain to the wound sites. She had an ABI with TBI's done on 01/11/2022 that showed a TBI of 0.15 on the left and monophasic PTA and DP. She also had an x-ray of the left foot on 01/10/2022 that showed soft tissue swelling with no acute bony abnormality. She currently denies systemic signs of infection. 02/16/2022; Patient's left great toe and left medial wound has healed. She is scheduled for an MRI at the end of November. She has been using Medihoney to the wound beds. She is currently on Augmentin and has 1 more day to complete her course. She still has mild swelling with mild erythema and pain to the wound site on the left lateral foot. Patient History Information obtained from Patient, Caregiver, Chart. Family History Unknown History. Social History [name], [name] ([id number]) [id number] Page 5 of 8 Former smoker, Marital Status - Single, Alcohol Use - Rarely, Drug Use - No History, Caffeine Use - Rarely. Medical History Eyes Patient has history of Glaucoma Respiratory Patient has history of Chronic Obstructive Pulmonary Disease (COPD) Cardiovascular Patient has history of Hypertension Endocrine Patient has history of Type II Diabetes Musculoskeletal Patient has history of Osteoarthritis Neurologic Patient has history of Dementia Hospitalization/Surgery History - pacemaker implant. - cholecystectomy. - appendectomy. - ulnar release. - total abdominal hysterectomy. Medical A Surgical History Notes nd Hematologic/Lymphatic thrombocytopenia Cardiovascular hyperlipidemia, complete heart block Gastrointestinal gerd, fatty liver disease Genitourinary UTI's Musculoskeletal seborrheic dermatitis, scoliosis Neurologic vertigo, spinal stenosis, hx of CVA Objective Constitutional respirations regular, non-labored and within target range for patient.. Vitals Time Taken: 3:06 PM, Temperature: 97.4 F, Pulse: 101 bpm, Respiratory Rate: 20  breaths/min, Blood Pressure: 156/81 mmHg. Cardiovascular 2+ dorsalis pedis/posterior tibialis pulses. Psychiatric pleasant and cooperative. General Notes: Left foot: 1 wound remaining to the lateral aspect with nonviable tissue throughout. Mild erythema and swelling to the site. Pain on palpation. Integumentary (Hair, Skin) Wound #1 status is Open. Original cause of wound was Gradually Appeared. The date acquired was: 01/10/2022. The wound has been in treatment 1 [name]. The wound is located on the Left,Lateral,Plantar Foot. The wound measures 3cm length x 2.5cm width x 0.3cm depth; 5.89cm^2 area and 1.767cm^3 volume. There is Fat Layer (Subcutaneous Tissue) exposed. There is a medium amount of serosanguineous drainage noted. The wound margin is distinct with the outline attached to the wound base. There is no granulation within the wound bed. There is a large (67-100%) amount of necrotic tissue within the wound bed including Eschar. The periwound skin appearance did not exhibit: Callus, Crepitus, Excoriation, Induration, Rash, Scarring, Dry/Scaly, Maceration, Atrophie Blanche, Cyanosis, Ecchymosis, Hemosiderin  Staining, Mottled, Pallor, Rubor, Erythema. Periwound temperature was noted as No Abnormality. The periwound has tenderness on palpation. Wound #2 status is Healed - Epithelialized. Original cause of wound was Gradually Appeared. The date acquired was: 01/10/2022. The wound has been in treatment 1 [name]. The wound is located on the Left,Medial Calcaneus. The wound measures 0cm length x 0cm width x 0cm depth; 0cm^2 area and 0cm^3 volume. There is Fat Layer (Subcutaneous Tissue) exposed. There is a medium amount of serosanguineous drainage noted. The wound margin is distinct with the outline attached to the wound base. There is no granulation within the wound bed. There is a large (67-100%) amount of necrotic tissue within the wound bed. The periwound skin appearance exhibited: Dry/Scaly. The  periwound skin appearance did not exhibit: Callus, Crepitus, Excoriation, Induration, Rash, Scarring, Maceration, Atrophie Blanche, Cyanosis, Ecchymosis, Hemosiderin Staining, Mottled, Pallor, Rubor, Erythema. Periwound temperature was noted as No Abnormality. The periwound has tenderness on palpation. Wound #3 status is Open. Original cause of wound was Gradually Appeared. The date acquired was: 01/10/2022. The wound has been in treatment 1 [name]. The wound is located on the Left T Great. The wound measures 0cm length x 0cm width x 0cm depth; 0cm^2 area and 0cm^3 volume. There is Fat Layer oe (Subcutaneous Tissue) exposed. There is a medium amount of serosanguineous drainage noted. The wound margin is distinct with the outline attached to the wound base. There is medium (34-66%) red, pink granulation within the wound bed. There is a medium (34-66%) amount of necrotic tissue within the wound bed. The periwound skin appearance did not exhibit: Callus, Crepitus, Excoriation, Induration, Rash, Scarring, Dry/Scaly, Maceration, Atrophie Blanche, Cyanosis, Ecchymosis, Hemosiderin Staining, Mottled, Pallor, Rubor, Erythema. Periwound temperature was noted as No Abnormality. The periwound has tenderness on palpation. Assessment CONLEIGH, HEINLEIN ([id number]) [id number] Page 6 of 8 Active Problems ICD-10 Type 2 diabetes mellitus with foot ulcer Non-pressure chronic ulcer of other part of left foot with fat layer exposed Non-pressure chronic ulcer of other part of left foot with other specified severity Peripheral vascular disease, unspecified Pain in left foot Patient has healed 2 out of her 3 wounds. She still has the lateral foot wound which has nonviable tissue throughout. She has a TBI of 0.15 and is scheduled to see Dr. Allyson Sabal in 2 [name]. For now I recommended Medihoney. She still has some mild swelling and erythema I recommended continuing Augmentin. Her MRI scheduled for  the end of next month and we will call to see if this can be pushed up earlier. Follow-up in 1 week. Procedures Wound #1 Pre-procedure diagnosis of Wound #1 is a Diabetic Wound/Ulcer of the Lower Extremity located on the Left,Lateral,Plantar Foot . An Paring/cutting of benign hyperkeratotic lesion 2-4 procedure was performed by Geralyn Corwin, [name]. Post procedure Diagnosis Wound #1: Same as Pre-Procedure Notes: trim dead skin Plan Follow-up Appointments: Return Appointment in 1 week. - w/ Dr. Mikey Bussing and Maryruth Bun Rm # 9 Anesthetic: (In clinic) Topical Lidocaine 5% applied to wound bed Bathing/ Shower/ Hygiene: May shower with protection but [name] not get wound dressing(s) wet. Edema Control - Lymphedema / SCD / Other: Elevate legs to the level of the heart or above for 30 minutes daily and/or when sitting, a frequency of: Avoid standing for long periods of time. Off-Loading: Other: - Keep pressure off of Left foot The following medication(s) was prescribed: amoxicillin-pot clavulanate oral 875 mg-125 mg tablet 1 1 tablet oral BID x 10 days starting 02/16/2022 WOUND #1: - Foot Wound  Laterality: Plantar, Left, Lateral Cleanser: Soap and Water 1 x Per Day/15 Days Discharge Instructions: May shower and wash wound with dial antibacterial soap and water prior to dressing change. Cleanser: Wound Cleanser (Generic) 1 x Per Day/15 Days Discharge Instructions: Cleanse the wound with wound cleanser prior to applying a clean dressing using gauze sponges, not tissue or cotton balls. Prim Dressing: MediHoney Gel, tube 1.5 (oz) 1 x Per Day/15 Days ary Discharge Instructions: Apply to wound bed as instructed Secondary Dressing: Optifoam Non-Adhesive Dressing, 4x4 in (Generic) 1 x Per Day/15 Days Discharge Instructions: Apply over primary dressing as directed. Secondary Dressing: Woven Gauze Sponge, Non-Sterile 4x4 in (Generic) 1 x Per Day/15 Days Discharge Instructions: Apply over primary dressing as  directed. Secured With: [employer], 4.5x3.1 (in/yd) (Generic) 1 x Per Day/15 Days Discharge Instructions: Secure with Kerlix as directed. Secured With: 25M Medipore H Soft Cloth Surgical T ape, 4 x 10 (in/yd) (Generic) 1 x Per Day/15 Days Discharge Instructions: Secure with tape as directed. 1. Medihoney 2. Continue Augmentin 3. Follow-up in 1 week Electronic Signature(s) Signed: 02/16/2022 4:46:31 PM By: Kalman Shan [name] Entered By: Kalman Shan on 02/16/2022 16:45:48 -------------------------------------------------------------------------------- HxROS Details Patient Name: Date of Service: [name]. 02/16/2022 3:15 PM Medical Record Number: [id number] Patient Account Number: [id number] [name], [name] ([id number]) [id number] Page 7 of 8 Date of Birth/Sex: Treating RN: March 27, 1936 (45 [name].o. F) Primary Care Provider: Other Clinician: Alain [name] Referring Provider: Treating Provider/Extender: Cathleen Fears in Treatment: 1 Information Obtained From Patient Caregiver Chart Eyes Medical History: Positive for: Glaucoma Hematologic/Lymphatic Medical History: Past Medical History Notes: thrombocytopenia Respiratory Medical History: Positive for: Chronic Obstructive Pulmonary Disease (COPD) Cardiovascular Medical History: Positive for: Hypertension Past Medical History Notes: hyperlipidemia, complete heart block Gastrointestinal Medical History: Past Medical History Notes: gerd, fatty liver disease Endocrine Medical History: Positive for: Type II Diabetes Genitourinary Medical History: Past Medical History Notes: UTI's Musculoskeletal Medical History: Positive for: Osteoarthritis Past Medical History Notes: seborrheic dermatitis, scoliosis Neurologic Medical History: Positive for: Dementia Past Medical History Notes: vertigo, spinal stenosis, hx of CVA HBO Extended History  Items Eyes: Glaucoma Immunizations Pneumococcal Vaccine: Received Pneumococcal Vaccination: Yes Received Pneumococcal Vaccination On or After 60th Birthday: Yes Implantable Devices Yes Hospitalization / Surgery History Type of Hospitalization/Surgery [name], [name] ([id number]) [id number] Page 8 of 8 pacemaker implant cholecystectomy appendectomy ulnar release total abdominal hysterectomy Family and Social History Unknown History: Yes; Former smoker; Marital Status - Single; Alcohol Use: Rarely; Drug Use: No History; Caffeine Use: Rarely; Financial Concerns: No; Food, Clothing or Shelter Needs: No; Support System Lacking: No; Transportation Concerns: No Electronic Signature(s) Signed: 02/16/2022 4:46:31 PM By: Kalman Shan [name] Entered By: Kalman Shan on 02/16/2022 16:43:20 -------------------------------------------------------------------------------- SuperBill Details Patient Name: Date of Service: [name] 02/16/2022 Medical Record Number: [id number] Patient Account Number: [id number] Date of Birth/Sex: Treating RN: 07-13-1935 (59 [name].o. [name], [name] Primary Care Provider: Alain [name] Other Clinician: Referring Provider: Treating Provider/Extender: Cathleen Fears in Treatment: 1 Diagnosis Coding ICD-10 Codes Code Description [phone] Type 2 diabetes mellitus with foot ulcer L97.522 Non-pressure chronic ulcer of other part of left foot with fat layer exposed L97.528 Non-pressure chronic ulcer of other part of left foot with other specified severity I73.9 Peripheral vascular disease, unspecified M79.672 Pain in left foot Facility Procedures : CPT4 Code: [id number] Description: 01093 - PARE BENIGN LES; SGL ICD-10 Diagnosis Description L97.522 Non-pressure chronic ulcer of other part of left foot  with fat layer exposed Modifier: Quantity: 1 Physician Procedures : CPT4 Code Description  Modifier [id number] - WC PHYS LEVEL 4 - EST PT ICD-10 Diagnosis Description E11.621 Type 2 diabetes mellitus with foot ulcer L97.522 Non-pressure chronic ulcer of other part of left foot with fat layer exposed L97.528  Non-pressure chronic ulcer of other part of left foot with other specified severity I73.9 Peripheral vascular disease, unspecified Quantity: 1 : [id number] 11055 - WC PHYS PARE BENIGN LES; SGL ICD-10 Diagnosis Description L97.522 Non-pressure chronic ulcer of other part of left foot with fat layer exposed Quantity: 1 Electronic Signature(s) Signed: 02/16/2022 4:46:31 PM By: Geralyn Corwin [name] Entered By: Geralyn Corwin on 02/16/2022 16:46:14

## 2022-05-11 NOTE — NC FL2 (Signed)
[place] LEVEL OF CARE FORM     IDENTIFICATION  Patient Name: [name] Birthdate: 05-15-1935 Sex: female Admission Date (Current Location): 05/07/2022  [place] and [place] Number:  [name] [id number] [place] and Address:  [facility] [place], [place] [address] [place], [place] [address]      Provider Number: [id number]  Attending Physician Name and Address:  Flora Lipps, MD  Relative Name and Phone Number:  Smith,Camilla Legal Guardian  [phone]    Current Level of Care: Hospital Recommended Level of Care: [place] Prior Approval Number:    Date Approved/Denied:   PASRR Number: [id number]  Discharge Plan: SNF    Current Diagnoses: Patient Active Problem List   Diagnosis Date Noted   Hematuria 05/07/2022   ABLA (acute blood loss anemia) 55/97/4163   Acute metabolic encephalopathy 84/53/6468   History of DVT (deep vein thrombosis) 05/07/2022   Bilateral hydronephrosis 05/07/2022   Chronic anticoagulation 05/07/2022   Hydroureteronephrosis 04/26/2022   Bacteremia 04/12/2022   Chronic osteomyelitis of left foot ([name]) 04/12/2022   Constipation 04/12/2022   T12 vertebral fracture ([name]) 04/04/2022   Peripheral arterial disease ([name]) 03/22/2022   Osteomyelitis of fifth toe of left foot ([name]) 03/14/2022   Urinary frequency 03/01/2022   Hyperglycemia due to type 2 diabetes mellitus ([place]) 01/10/2022   History of CVA (cerebrovascular accident) 01/10/2022   Dementia ([name]) 12/09/2021   Polycythemia 12/09/2021   Fall    Stricture and stenosis of esophagus    Abnormal esophagram    Dysphagia    Protein-calorie malnutrition, severe 10/03/2021   Diabetic foot ulcers ([place]) - right foot 10/02/2021   Seborrheic dermatitis 10/12/2020   Pain due to onychomycosis of toenails of both feet 09/01/2020   Sjogren's syndrome with keratoconjunctivitis sicca ([name]) 06/07/2020   Mild cognitive impairment with memory loss  01/29/2020   Full code status 01/26/2020   Pacemaker 01/19/2020   Leukocytosis 01/19/2020   Pressure injury of skin 01/14/2020   Complete heart block ([name])    Lacks decision making capacity    Acute on chronic respiratory failure with hypoxia ([name])    Scoliosis due to degenerative disease of spine in adult patient 09/10/2018   Radicular pain of left lower extremity 09/10/2018   Hand arthritis 10/11/2017   Uncontrolled type 2 diabetes mellitus with hyperglycemia, with long-term current use of insulin ([name]) 03/08/2017   Thrombocytopenia ([name]) 11/08/2016   Degenerative lumbar spinal stenosis 10/27/2015   Arthralgia 09/22/2015   Chronic respiratory failure with hypoxia ([name]) 01/11/2015   Diabetic polyneuropathy associated with type 2 diabetes mellitus ([name]) 11/16/2014   Hypertension 05/19/2013   Vertigo 05/11/2013   Dizziness 05/11/2013   Hyperlipidemia 02/15/2013   Uncontrolled type 2 diabetes mellitus with hyperglycemia ([name]) 01/01/2013   Benign paroxysmal positional vertigo 08/09/2012   Type II or unspecified type diabetes mellitus with neurological manifestations, uncontrolled(250.62) 08/09/2012   GLAUCOMA 03/16/2010   COPD mixed type with chronic respiratory failure  01/15/2009   Seasonal and perennial allergic rhinitis 11/23/2007   CHRONIC RHINITIS 07/16/2007   GERD (gastroesophageal reflux disease) 05/29/2007   I B S-DIARRHEAL PREDOMINATE 05/29/2007   FATTY LIVER DISEASE 05/29/2007    Orientation RESPIRATION BLADDER Height & Weight     Self, Place  O2 Incontinent, Indwelling catheter Weight: 120 lb (54.4 kg) Height:  5\' 3"  (160 cm)  BEHAVIORAL SYMPTOMS/MOOD NEUROLOGICAL BOWEL NUTRITION STATUS      Incontinent Diet (see discharge summary)  AMBULATORY STATUS COMMUNICATION OF NEEDS Skin   Extensive Assist  Verbally Other (Comment) (redness, ecchymosis)                       Personal Care Assistance Level of Assistance  Bathing, Feeding, Dressing Bathing Assistance:  Maximum assistance Feeding assistance: Limited assistance Dressing Assistance: Maximum assistance     Functional Limitations Info  Sight, Hearing, Speech Sight Info: Adequate Hearing Info: Adequate Speech Info: Adequate    SPECIAL CARE FACTORS FREQUENCY  PT (By licensed PT), OT (By licensed OT)     PT Frequency: 5x week OT Frequency: 5x week            Contractures Contractures Info: Not present    Additional Factors Info  Code Status, Allergies, Insulin Sliding Scale Code Status Info: DNR Allergies Info: Chlordiazepoxide-clidinium, Biaxin (Clarithromycin), Chlordiazepoxide, Tape, Ultram (Tramadol), Codeine   Insulin Sliding Scale Info: Novolog: see discharge summary       Current Medications (05/11/2022):  This is the current hospital active medication list Current Facility-Administered Medications  Medication Dose Route Frequency Provider Last Rate Last Admin   0.9 %  sodium chloride infusion (Manually program via Guardrails IV Fluids)   Intravenous Once Tamala Julian, Rondell A, MD       0.9 %  sodium chloride infusion   Intravenous Continuous Fuller Plan A, MD 75 mL/hr at 05/09/22 1711 New Bag at 05/09/22 1711   acetaminophen (TYLENOL) tablet 650 mg  650 mg Oral Q6H PRN Norval Morton, MD       Or   acetaminophen (TYLENOL) suppository 650 mg  650 mg Rectal Q6H PRN Fuller Plan A, MD       albuterol (PROVENTIL) (2.5 MG/3ML) 0.083% nebulizer solution 2.5 mg  2.5 mg Inhalation Q4H PRN Pokhrel, Laxman, MD   2.5 mg at 05/10/22 1802   amoxicillin-clavulanate (AUGMENTIN) 875-125 MG per tablet 1 tablet  1 tablet Oral Q12H Elodia Florence., MD   1 tablet at 05/11/22 2423   benzonatate (TESSALON) capsule 100 mg  100 mg Oral TID PRN Elodia Florence., MD       bisacodyl (DULCOLAX) suppository 10 mg  10 mg Rectal Daily PRN Pokhrel, Laxman, MD       Chlorhexidine Gluconate Cloth 2 % PADS 6 each  6 each Topical Daily Pokhrel, Laxman, MD   6 each at 05/11/22 0953   docusate  sodium (COLACE) capsule 100 mg  100 mg Oral Daily Pokhrel, Laxman, MD   100 mg at 05/11/22 0952   feeding supplement (ENSURE ENLIVE / ENSURE PLUS) liquid 237 mL  237 mL Oral BID BM Pokhrel, Laxman, MD   237 mL at 05/11/22 1011   fluorometholone (FML) 0.1 % ophthalmic suspension 1 drop  1 drop Both Eyes TID Pokhrel, Laxman, MD   1 drop at 05/11/22 0959   fluticasone furoate-vilanterol (BREO ELLIPTA) 100-25 MCG/ACT 1 puff  1 puff Inhalation Daily Pokhrel, Laxman, MD       Gerhardt's butt cream   Topical TID Pokhrel, Laxman, MD   Given at 05/11/22 1005   insulin aspart (novoLOG) injection 0-9 Units  0-9 Units Subcutaneous TID WC Elodia Florence., MD   3 Units at 05/11/22 1221   insulin glargine-yfgn (SEMGLEE) injection 18 Units  18 Units Subcutaneous QPC breakfast Pokhrel, Laxman, MD   18 Units at 05/11/22 0952   lactulose (CHRONULAC) 10 GM/15ML solution 20 g  20 g Oral Daily Pokhrel, Laxman, MD   20 g at 05/11/22 0952   magnesium oxide (MAG-OX) tablet 400 mg  400 mg Oral BID Pokhrel, Laxman, MD   400 mg at 05/11/22 1221   magnesium sulfate IVPB 2 g 50 mL  2 g Intravenous Once Pokhrel, Laxman, MD 50 mL/hr at 05/11/22 1229 2 g at 05/11/22 1229   multivitamin with minerals tablet 1 tablet  1 tablet Oral Daily Pokhrel, Laxman, MD   1 tablet at 05/11/22 0951   pravastatin (PRAVACHOL) tablet 20 mg  20 mg Oral q1800 Pokhrel, Laxman, MD       saccharomyces boulardii (FLORASTOR) capsule 250 mg  250 mg Oral BID Pokhrel, Laxman, MD   250 mg at 05/11/22 0951   sodium chloride flush (NS) 0.9 % injection 3 mL  3 mL Intravenous Q12H Smith, Rondell A, MD   3 mL at 05/10/22 2300   sodium chloride irrigation 0.9 % 3,000 mL  3,000 mL Irrigation Continuous Heloise Purpura, MD   3,000 mL at 05/07/22 2122   thiamine (VITAMIN B1) tablet 100 mg  100 mg Oral Daily Pokhrel, Laxman, MD   100 mg at 05/11/22 0951   umeclidinium bromide (INCRUSE ELLIPTA) 62.5 MCG/ACT 1 puff  1 puff Inhalation Daily Clydie Braun, MD          Discharge Medications: Please see discharge summary for a list of discharge medications.  Relevant Imaging Results:  Relevant Lab Results:   Additional Information SSN: [id number], Both COVID 1 boosters  Lorri Frederick, LCSW

## 2022-05-11 NOTE — Progress Notes (Addendum)
Pt d/ced but due to error in chart was't taken out same time pt left.  Errors in process of being resolved.

## 2022-05-11 NOTE — Final Progress Note (Signed)
Patient discharged to [facility] room 205, with foley catheter and oxygen 2 L via PTAR. Patient alert, stable vital signs when leaving 5N. Report given to RN Retha from [place]. Legal Guardian Olin Hauser aware about patient being transported.

## 2022-05-12 ENCOUNTER — Other Ambulatory Visit: Payer: Self-pay | Admitting: Internal Medicine

## 2022-05-12 NOTE — Progress Notes (Signed)
Patient went to SNF. Please let me know if I need to schedule with you.  [place]  Direct Dial: [phone]

## 2022-05-15 DIAGNOSIS — S22080D Wedge compression fracture of T11-T12 vertebra, subsequent encounter for fracture with routine healing: Secondary | ICD-10-CM | POA: Diagnosis not present

## 2022-05-15 DIAGNOSIS — Z09 Encounter for follow-up examination after completed treatment for conditions other than malignant neoplasm: Secondary | ICD-10-CM | POA: Diagnosis not present

## 2022-05-15 DIAGNOSIS — E1165 Type 2 diabetes mellitus with hyperglycemia: Secondary | ICD-10-CM | POA: Diagnosis not present

## 2022-05-15 DIAGNOSIS — N1330 Unspecified hydronephrosis: Secondary | ICD-10-CM | POA: Diagnosis not present

## 2022-05-15 DIAGNOSIS — N820 Vesicovaginal fistula: Secondary | ICD-10-CM | POA: Diagnosis not present

## 2022-05-15 DIAGNOSIS — I152 Hypertension secondary to endocrine disorders: Secondary | ICD-10-CM | POA: Diagnosis not present

## 2022-05-15 DIAGNOSIS — Z794 Long term (current) use of insulin: Secondary | ICD-10-CM | POA: Diagnosis not present

## 2022-05-15 DIAGNOSIS — Z9981 Dependence on supplemental oxygen: Secondary | ICD-10-CM | POA: Diagnosis not present

## 2022-05-15 DIAGNOSIS — D62 Acute posthemorrhagic anemia: Secondary | ICD-10-CM | POA: Diagnosis not present

## 2022-05-15 DIAGNOSIS — J9610 Chronic respiratory failure, unspecified whether with hypoxia or hypercapnia: Secondary | ICD-10-CM | POA: Diagnosis not present

## 2022-05-15 DIAGNOSIS — R110 Nausea: Secondary | ICD-10-CM | POA: Diagnosis not present

## 2022-05-15 DIAGNOSIS — I479 Paroxysmal tachycardia, unspecified: Secondary | ICD-10-CM | POA: Diagnosis not present

## 2022-05-15 DIAGNOSIS — M86672 Other chronic osteomyelitis, left ankle and foot: Secondary | ICD-10-CM | POA: Diagnosis not present

## 2022-05-15 DIAGNOSIS — Z86718 Personal history of other venous thrombosis and embolism: Secondary | ICD-10-CM | POA: Diagnosis not present

## 2022-05-15 DIAGNOSIS — Z8639 Personal history of other endocrine, nutritional and metabolic disease: Secondary | ICD-10-CM | POA: Diagnosis not present

## 2022-05-15 DIAGNOSIS — J449 Chronic obstructive pulmonary disease, unspecified: Secondary | ICD-10-CM | POA: Diagnosis not present

## 2022-05-15 DIAGNOSIS — E639 Nutritional deficiency, unspecified: Secondary | ICD-10-CM | POA: Diagnosis not present

## 2022-05-15 DIAGNOSIS — E1169 Type 2 diabetes mellitus with other specified complication: Secondary | ICD-10-CM | POA: Diagnosis not present

## 2022-05-15 DIAGNOSIS — D6859 Other primary thrombophilia: Secondary | ICD-10-CM | POA: Diagnosis not present

## 2022-05-15 DIAGNOSIS — Z950 Presence of cardiac pacemaker: Secondary | ICD-10-CM | POA: Diagnosis not present

## 2022-05-15 DIAGNOSIS — I442 Atrioventricular block, complete: Secondary | ICD-10-CM | POA: Diagnosis not present

## 2022-05-17 DIAGNOSIS — N3000 Acute cystitis without hematuria: Secondary | ICD-10-CM | POA: Diagnosis not present

## 2022-05-17 DIAGNOSIS — R310 Gross hematuria: Secondary | ICD-10-CM | POA: Diagnosis not present

## 2022-05-17 DIAGNOSIS — N130 Hydronephrosis with ureteropelvic junction obstruction: Secondary | ICD-10-CM | POA: Diagnosis not present

## 2022-05-24 DIAGNOSIS — J449 Chronic obstructive pulmonary disease, unspecified: Secondary | ICD-10-CM | POA: Diagnosis not present

## 2022-05-24 DIAGNOSIS — Z87448 Personal history of other diseases of urinary system: Secondary | ICD-10-CM | POA: Diagnosis not present

## 2022-05-24 DIAGNOSIS — Z794 Long term (current) use of insulin: Secondary | ICD-10-CM | POA: Diagnosis not present

## 2022-05-24 DIAGNOSIS — R627 Adult failure to thrive: Secondary | ICD-10-CM | POA: Diagnosis not present

## 2022-05-24 DIAGNOSIS — E11649 Type 2 diabetes mellitus with hypoglycemia without coma: Secondary | ICD-10-CM | POA: Diagnosis not present

## 2022-05-24 DIAGNOSIS — Z960 Presence of urogenital implants: Secondary | ICD-10-CM | POA: Diagnosis not present

## 2022-05-24 DIAGNOSIS — Z681 Body mass index (BMI) 19 or less, adult: Secondary | ICD-10-CM | POA: Diagnosis not present

## 2022-05-24 DIAGNOSIS — D649 Anemia, unspecified: Secondary | ICD-10-CM | POA: Diagnosis not present

## 2022-06-05 ENCOUNTER — Ambulatory Visit: Payer: Medicare Other

## 2022-06-05 DIAGNOSIS — I442 Atrioventricular block, complete: Secondary | ICD-10-CM | POA: Diagnosis not present

## 2022-06-07 LAB — CUP PACEART REMOTE DEVICE CHECK
Battery Remaining Longevity: 84 mo
Battery Remaining Percentage: 78 %
Battery Voltage: 3.01 V
Brady Statistic AP VP Percent: 1.2 %
Brady Statistic AP VS Percent: 1 %
Brady Statistic AS VP Percent: 99 %
Brady Statistic AS VS Percent: 1 %
Brady Statistic RA Percent Paced: 1 %
Brady Statistic RV Percent Paced: 99 %
Date Time Interrogation Session: 20240130160105
Implantable Lead Connection Status: 753985
Implantable Lead Connection Status: 753985
Implantable Lead Implant Date: 20210908
Implantable Lead Implant Date: 20210908
Implantable Lead Location: 753859
Implantable Lead Location: 753860
Implantable Pulse Generator Implant Date: 20210908
Lead Channel Impedance Value: 340 Ohm
Lead Channel Impedance Value: 440 Ohm
Lead Channel Pacing Threshold Amplitude: 0.75 V
Lead Channel Pacing Threshold Amplitude: 1 V
Lead Channel Pacing Threshold Pulse Width: 0.4 ms
Lead Channel Pacing Threshold Pulse Width: 0.5 ms
Lead Channel Sensing Intrinsic Amplitude: 10.9 mV
Lead Channel Sensing Intrinsic Amplitude: 2.1 mV
Lead Channel Setting Pacing Amplitude: 1.25 V
Lead Channel Setting Pacing Amplitude: 2 V
Lead Channel Setting Pacing Pulse Width: 0.5 ms
Lead Channel Setting Sensing Sensitivity: 2 mV
Pulse Gen Model: 2272
Pulse Gen Serial Number: 3856705

## 2022-06-13 ENCOUNTER — Ambulatory Visit: Payer: Medicare Other | Admitting: Infectious Diseases

## 2022-06-13 ENCOUNTER — Telehealth: Payer: Self-pay

## 2022-06-13 NOTE — Telephone Encounter (Signed)
Called patient to reschedule missed appointment. Spoke with patient's care giver who states patient is currently at [facility].  Called SNF to reschedule appointment 06/16/22 Phone: [phone] Leatrice Jewels, [place]

## 2022-06-16 ENCOUNTER — Ambulatory Visit: Payer: Medicare Other | Admitting: Infectious Diseases

## 2022-06-20 ENCOUNTER — Ambulatory Visit: Payer: Medicare Other | Attending: Cardiovascular Disease | Admitting: Cardiovascular Disease

## 2022-06-29 ENCOUNTER — Other Ambulatory Visit (HOSPITAL_COMMUNITY): Payer: Self-pay | Admitting: Urology

## 2022-06-29 DIAGNOSIS — N1330 Unspecified hydronephrosis: Secondary | ICD-10-CM

## 2022-07-03 DIAGNOSIS — R339 Retention of urine, unspecified: Secondary | ICD-10-CM | POA: Diagnosis not present

## 2022-07-03 DIAGNOSIS — E1165 Type 2 diabetes mellitus with hyperglycemia: Secondary | ICD-10-CM | POA: Diagnosis not present

## 2022-07-03 DIAGNOSIS — Z794 Long term (current) use of insulin: Secondary | ICD-10-CM | POA: Diagnosis not present

## 2022-07-03 DIAGNOSIS — I152 Hypertension secondary to endocrine disorders: Secondary | ICD-10-CM | POA: Diagnosis not present

## 2022-07-03 DIAGNOSIS — L89620 Pressure ulcer of left heel, unstageable: Secondary | ICD-10-CM | POA: Diagnosis not present

## 2022-07-03 DIAGNOSIS — J449 Chronic obstructive pulmonary disease, unspecified: Secondary | ICD-10-CM | POA: Diagnosis not present

## 2022-07-03 DIAGNOSIS — Z9981 Dependence on supplemental oxygen: Secondary | ICD-10-CM | POA: Diagnosis not present

## 2022-07-03 DIAGNOSIS — J9610 Chronic respiratory failure, unspecified whether with hypoxia or hypercapnia: Secondary | ICD-10-CM | POA: Diagnosis not present

## 2022-07-03 DIAGNOSIS — L89890 Pressure ulcer of other site, unstageable: Secondary | ICD-10-CM | POA: Diagnosis not present

## 2022-07-03 DIAGNOSIS — Z960 Presence of urogenital implants: Secondary | ICD-10-CM | POA: Diagnosis not present

## 2022-07-05 DIAGNOSIS — Z87440 Personal history of urinary (tract) infections: Secondary | ICD-10-CM | POA: Diagnosis not present

## 2022-07-05 DIAGNOSIS — E1165 Type 2 diabetes mellitus with hyperglycemia: Secondary | ICD-10-CM | POA: Diagnosis not present

## 2022-07-05 DIAGNOSIS — I442 Atrioventricular block, complete: Secondary | ICD-10-CM | POA: Diagnosis not present

## 2022-07-05 DIAGNOSIS — Z681 Body mass index (BMI) 19 or less, adult: Secondary | ICD-10-CM | POA: Diagnosis not present

## 2022-07-05 DIAGNOSIS — E46 Unspecified protein-calorie malnutrition: Secondary | ICD-10-CM | POA: Diagnosis not present

## 2022-07-05 DIAGNOSIS — S22080D Wedge compression fracture of T11-T12 vertebra, subsequent encounter for fracture with routine healing: Secondary | ICD-10-CM | POA: Diagnosis not present

## 2022-07-05 DIAGNOSIS — Z7984 Long term (current) use of oral hypoglycemic drugs: Secondary | ICD-10-CM | POA: Diagnosis not present

## 2022-07-05 DIAGNOSIS — L89620 Pressure ulcer of left heel, unstageable: Secondary | ICD-10-CM | POA: Diagnosis not present

## 2022-07-05 DIAGNOSIS — N1330 Unspecified hydronephrosis: Secondary | ICD-10-CM | POA: Diagnosis not present

## 2022-07-05 DIAGNOSIS — M86672 Other chronic osteomyelitis, left ankle and foot: Secondary | ICD-10-CM | POA: Diagnosis not present

## 2022-07-05 DIAGNOSIS — L89892 Pressure ulcer of other site, stage 2: Secondary | ICD-10-CM | POA: Diagnosis not present

## 2022-07-05 DIAGNOSIS — I152 Hypertension secondary to endocrine disorders: Secondary | ICD-10-CM | POA: Diagnosis not present

## 2022-07-05 DIAGNOSIS — Z794 Long term (current) use of insulin: Secondary | ICD-10-CM | POA: Diagnosis not present

## 2022-07-05 DIAGNOSIS — J9610 Chronic respiratory failure, unspecified whether with hypoxia or hypercapnia: Secondary | ICD-10-CM | POA: Diagnosis not present

## 2022-07-05 DIAGNOSIS — Z9981 Dependence on supplemental oxygen: Secondary | ICD-10-CM | POA: Diagnosis not present

## 2022-07-05 DIAGNOSIS — R339 Retention of urine, unspecified: Secondary | ICD-10-CM | POA: Diagnosis not present

## 2022-07-06 ENCOUNTER — Other Ambulatory Visit: Payer: Self-pay

## 2022-07-06 ENCOUNTER — Ambulatory Visit (INDEPENDENT_AMBULATORY_CARE_PROVIDER_SITE_OTHER): Payer: Medicare Other | Admitting: Infectious Diseases

## 2022-07-06 VITALS — BP 126/78 | HR 106 | Temp 97.5°F

## 2022-07-06 DIAGNOSIS — M869 Osteomyelitis, unspecified: Secondary | ICD-10-CM

## 2022-07-06 DIAGNOSIS — E1142 Type 2 diabetes mellitus with diabetic polyneuropathy: Secondary | ICD-10-CM | POA: Diagnosis not present

## 2022-07-06 DIAGNOSIS — E11621 Type 2 diabetes mellitus with foot ulcer: Secondary | ICD-10-CM

## 2022-07-06 DIAGNOSIS — S91302A Unspecified open wound, left foot, initial encounter: Secondary | ICD-10-CM

## 2022-07-06 DIAGNOSIS — L97519 Non-pressure chronic ulcer of other part of right foot with unspecified severity: Secondary | ICD-10-CM

## 2022-07-06 DIAGNOSIS — Z79899 Other long term (current) drug therapy: Secondary | ICD-10-CM | POA: Insufficient documentation

## 2022-07-06 NOTE — Progress Notes (Addendum)
Patient Active Problem List   Diagnosis Date Noted   Hematuria 05/07/2022   ABLA (acute blood loss anemia) 0000000   Acute metabolic encephalopathy 0000000   History of DVT (deep vein thrombosis) 05/07/2022   Bilateral hydronephrosis 05/07/2022   Chronic anticoagulation 05/07/2022   Hydroureteronephrosis 04/26/2022   Bacteremia 04/12/2022   Chronic osteomyelitis of left foot ([place]) 04/12/2022   Constipation 04/12/2022   T12 vertebral fracture ([place]) 04/04/2022   Peripheral arterial disease ([place]) 03/22/2022   Osteomyelitis of fifth toe of left foot ([place]) 03/14/2022   Urinary frequency 03/01/2022   Hyperglycemia due to type 2 diabetes mellitus ([place]) 01/10/2022   History of CVA (cerebrovascular accident) 01/10/2022   Dementia ([place]) 12/09/2021   Polycythemia 12/09/2021   Fall    Stricture and stenosis of esophagus    Abnormal esophagram    Dysphagia    Protein-calorie malnutrition, severe 10/03/2021   Diabetic foot ulcers ([place]) - right foot 10/02/2021   Seborrheic dermatitis 10/12/2020   Pain due to onychomycosis of toenails of both feet 09/01/2020   Sjogren's syndrome with keratoconjunctivitis sicca ([place]) 06/07/2020   Mild cognitive impairment with memory loss 01/29/2020   Full code status 01/26/2020   Pacemaker 01/19/2020   Leukocytosis 01/19/2020   Pressure injury of skin 01/14/2020   Complete heart block ([place])    Lacks decision making capacity    Acute on chronic respiratory failure with hypoxia ([place])    Scoliosis due to degenerative disease of spine in adult patient 09/10/2018   Radicular pain of left lower extremity 09/10/2018   Hand arthritis 10/11/2017   Uncontrolled type 2 diabetes mellitus with hyperglycemia, with long-term current use of insulin ([place]) 03/08/2017   Thrombocytopenia ([place]) 11/08/2016   Degenerative lumbar spinal stenosis 10/27/2015   Arthralgia 09/22/2015   Chronic respiratory failure with hypoxia ([place]) 01/11/2015   Diabetic polyneuropathy  associated with type 2 diabetes mellitus ([place]) 11/16/2014   Hypertension 05/19/2013   Vertigo 05/11/2013   Dizziness 05/11/2013   Hyperlipidemia 02/15/2013   Uncontrolled type 2 diabetes mellitus with hyperglycemia ([facility]) 01/01/2013   Benign paroxysmal positional vertigo 08/09/2012   Type II or unspecified type diabetes mellitus with neurological manifestations, uncontrolled(250.62) 08/09/2012   GLAUCOMA 03/16/2010   COPD mixed type with chronic respiratory failure  01/15/2009   Seasonal and perennial allergic rhinitis 11/23/2007   CHRONIC RHINITIS 07/16/2007   GERD (gastroesophageal reflux disease) 05/29/2007   I B S-DIARRHEAL PREDOMINATE 05/29/2007   FATTY LIVER DISEASE 05/29/2007    Patient's Medications  New Prescriptions   No medications on file  Previous Medications   ACETAMINOPHEN (TYLENOL) 325 MG TABLET    Take 2 tablets (650 mg total) by mouth every 6 (six) hours as needed for mild pain (or Fever >/= 101).   ALBUTEROL (VENTOLIN HFA) 108 (90 BASE) MCG/ACT INHALER    Inhale 1-2 puffs into the lungs every 6 (six) hours as needed for wheezing or shortness of breath.   BISACODYL (DULCOLAX) 10 MG SUPPOSITORY    Place 1 suppository (10 mg total) rectally daily as needed for moderate constipation.   DOCUSATE SODIUM (COLACE) 100 MG CAPSULE    Take 100 mg by mouth in the morning.   FEEDING SUPPLEMENT (ENSURE ENLIVE / ENSURE PLUS) LIQD    Take 237 mLs by mouth 2 (two) times daily between meals.   FLUOROMETHOLONE (FML) 0.1 % OPHTHALMIC SUSPENSION    Place 1 drop into both eyes 3 (three) times daily.   INSULIN ASPART (NOVOLOG) 100 UNIT/ML INJECTION  Inject 0-15 Units into the skin 3 (three) times daily with meals. CBG < 70: Implement Hypoglycemia Standing Orders and refer to Hypoglycemia Standing Orders sidebar report CBG 70 - 120: 0 units CBG 121 - 150: 2 units CBG 151 - 200: 3 units CBG 201 - 250: 5 units CBG 251 - 300: 8 units CBG 301 - 350: 11 units CBG 351 - 400: 15 units CBG > 400: call  MD and obtain STAT lab verification   IPRATROPIUM-ALBUTEROL (DUONEB) 0.5-2.5 (3) MG/3ML SOLN    Use 1 vial in nebulizer every 8 hours and as needed   LACTULOSE (CHRONULAC) 10 GM/15ML SOLUTION    Take 30 mLs (20 g total) by mouth daily.   LEVALBUTEROL (XOPENEX HFA) 45 MCG/ACT INHALER    Inhale 1-2 puffs into the lungs every 6 (six) hours as needed for wheezing.   METOPROLOL SUCCINATE (TOPROL-XL) 25 MG 24 HR TABLET    Take 1 tablet (25 mg total) by mouth daily.   MULTIPLE VITAMIN (MULTIVITAMIN WITH MINERALS) TABS TABLET    Take 1 tablet by mouth daily.   MYRBETRIQ 50 MG TB24 TABLET    TAKE 1 TABLET DAILY   NYSTATIN (GERHARDT'S BUTT CREAM) CREA    Apply 1 Application topically 3 (three) times daily.   OXYGEN    Inhale 3 L/min into the lungs continuous.   PANTOPRAZOLE (PROTONIX) 40 MG TABLET    Take 1 tablet (40 mg total) by mouth daily.   PRAVASTATIN (PRAVACHOL) 20 MG TABLET    Take 1 tablet (20 mg total) by mouth daily.   PROTECTIVE BARRIER (RESTORE) CREA    Apply 1 application  topically See admin instructions. Apply to sacrum, buttocks, and peri area EVERY 12 HOURS   SEMGLEE 100 UNIT/ML INJECTION    Inject 18 Units into the skin in the morning.   THIAMINE 100 MG TABLET    Take 1 tablet (100 mg total) by mouth daily.   TIOTROPIUM BROMIDE-OLODATEROL (STIOLTO RESPIMAT) 2.5-2.5 MCG/ACT AERS    Inhale 2 puffs into the lungs daily.  Modified Medications   No medications on file  Discontinued Medications   No medications on file    Subjective: 87 Y O Female with PMH as below including DM2 w polyneuropathy, CVA/cognitive communication deficit, s/p PPM  who is here for HFU for chronic rt lateral foot DFU and osteomyelitis. She was discharged on 04/27/22 for plan to continue augmentin until 1/17 fu visit with Dr Tommy Medal. She was readmitted 12/31-05/11/22 for ABLA 2/2 hematuria. CT abdomen pelvis 05/08/22 findings:  1 Marked diffuse circumferential wall thickening of the urinary bladder which is now  collapsed around a Foley catheter balloon. Assessment for underlying bladder lesion is diminished due to incomplete distension. 2. Bilateral hydronephrosis and hydroureter to the level of the urinary bladder. Findings may reflect sequelae of chronic bladder outlet obstruction. 3. Bilateral striated nephrograms concerning for pyelonephritis. 4. There is a defect with along the posterior wall of the urethra with possible fistulous communication with the vagina. Clinical correlation advised. 5. Atelectasis and consolidation within the left lower lobe. New from 04/04/2022 6. Stable T12 compression fracture. 7.  Aortic Atherosclerosis (ICD10-I70.0).  Patient to fu with OP urology for cystoscopy as well as catheter management. She was continued on PO augmentin until 1/17 clinic visit with Dr Tommy Medal. Of note, she had refused surgery for her RT foot previously. I also read question about her decision making capacity in the chart. She has missed her ID appt with Dr Lucianne Lei  Dam and has been off antibiotics for almost 2.5 months now.   07/06/22 Accompanied by staff from facility. No MAR available but denies being on abtx per patient as well as accompanying staff. Patient also expresses displeasure to be on prolonged abtx. She knows that she is in the doctor's office, able to tell name, but not year, month or President's name. She answers questions appropriately but also has poor memory. Denies fevers, chills. Denies nausea, vomiting and diarrhea.   Review of Systems: all systems reviewed with pertinent positives and negatives as listed above   Past Medical History:  Diagnosis Date   Acute ischemic stroke ([place]) 08/10/2021   Anxiety disorder, unspecified    Arthritis    Benign paroxysmal positional vertigo 08/09/2012   Chronic rhinitis    Cognitive communication deficit    Constipation, unspecified    COPD mixed type ([place])    on home O2   Diverticulosis    DKA (diabetic ketoacidosis) ([place])  10/02/2021   Esophageal stricture    Essential (primary) hypertension    Gastro-esophageal reflux disease without esophagitis    Glaucoma    Hiatal hernia    Hyperlipidemia, unspecified    Irritable bowel syndrome    Ischemic stroke ([place]) 08/09/2021   Osteomyelitis of fifth toe of left foot ([place]) 03/14/2022   Presence of permanent cardiac pacemaker 01/14/2020   Spinal stenosis, lumbar region, without neurogenic claudication    Steatohepatitis    Thrombocytopenia, unspecified ([place])    Type 2 diabetes mellitus with diabetic polyneuropathy ([place])    Past Surgical History:  Procedure Laterality Date   APPENDECTOMY     CARPAL TUNNEL RELEASE     right hand   CHOLECYSTECTOMY OPEN  1978   COLONOSCOPY  07-2001   mild diverticulosis   ESOPHAGOGASTRODUODENOSCOPY  [id number]   H Hernia,es.stricture s/p dil 51F   INTRAOCULAR LENS INSERTION Bilateral    LIVER BIOPSY  [id number]   PACEMAKER IMPLANT N/A 01/14/2020   Procedure: PACEMAKER IMPLANT;  Surgeon: Evans Lance, MD;  Location: [facility];  Service: Cardiovascular;  Laterality: N/A;   PARATHYROID EXPLORATION     TONSILLECTOMY AND ADENOIDECTOMY     TOTAL ABDOMINAL HYSTERECTOMY     ULNAR NERVE TRANSPOSITION  12/07/2011   Procedure: ULNAR NERVE DECOMPRESSION/TRANSPOSITION;this was cancelled-not done  Surgeon: Cammie Sickle., MD;  Location: [facility];  Service: Orthopedics;  Laterality: Right;  right ulnar nerve in situ decompression   ULNAR TUNNEL RELEASE  03/07/2012   Procedure: CUBITAL TUNNEL RELEASE;  Surgeon: Roseanne Kaufman, MD;  Location: [facility];  Service: Orthopedics;  Laterality: Right;  ulnar nerve release at the elbow       Social History   Tobacco Use   Smoking status: Former    Types: Cigarettes    Quit date: 05/08/1988    Years since quitting: 34.1   Smokeless tobacco: Never  Vaping Use   Vaping Use: Never used  Substance Use Topics   Alcohol use: No    Alcohol/week: 0.0  standard drinks of alcohol   Drug use: No    Family History  Problem Relation Age of Onset   Heart disease Father    Lung cancer Mother        small cell;Byssinosis   Lung cancer Sister    Liver cancer Sister        ? mets from another area of the body   Diabetes Other        grandmother  Stroke Maternal Grandfather     Allergies  Allergen Reactions   Chlordiazepoxide-Clidinium Other (See Comments)    Sleepy,weak- "Allergic," per MAR   Biaxin [Clarithromycin] Diarrhea and Other (See Comments)    Foul taste, abd pain, diarrhea- "Allergic," per MAR   Chlordiazepoxide Other (See Comments)    "Allergic," per MAR   Tape Other (See Comments)    SKIN IS VERY THIN AND WILL TEAR AND BRUISE EASILY!! "Allergic," per MAR   Ultram [Tramadol] Other (See Comments)    Caused tremors   Codeine Nausea Only and Other (See Comments)    Upset the stomach- "Allergic," per [facility]    Health Maintenance  Topic Date Due   Medicare Annual Wellness (AWV)  Never done   DTaP/Tdap/Td (1 - Tdap) Never done   Zoster Vaccines- Shingrix (1 of 2) Never done   DEXA SCAN  Never done   Diabetic kidney evaluation - Urine ACR  05/11/2018   OPHTHALMOLOGY EXAM  12/28/2018   FOOT EXAM  09/01/2021   INFLUENZA VACCINE  12/06/2021   COVID-19 Vaccine (4 - 2023-24 season) 01/06/2022   HEMOGLOBIN A1C  10/04/2022   Diabetic kidney evaluation - eGFR measurement  05/12/2023   Pneumonia Vaccine 40+ Years old  Completed   HPV VACCINES  Aged Out    Objective: BP 126/78   Pulse (!) 106   Temp (!) 97.5 F (36.4 C) (Oral)   SpO2 97% Comment: 3L O2 [place]   Physical Exam Constitutional:      Appearance: Normal appearance. Sitting in a wheelchair, on3L [place] HENT:     Head: Normocephalic and atraumatic.      Mouth: Mucous membranes are moist.  Eyes:    Conjunctiva/sclera: Conjunctivae normal.     Pupils: Pupils are equal, round, and reactive to light.   Cardiovascular:     Rate and Rhythm: Normal rate and regular  rhythm.     Heart sounds: No murmur heard. No friction rub. No gallop.   Pulmonary:     Effort: Pulmonary effort is normal.     Breath sounds: Normal breath sounds.   Abdominal:     General: Non distended     Palpations: soft.   Musculoskeletal:        General: Normal range of motion.   Prior rt lateral foot wound seems to have healed   Left lateral foot with small open wound near 5th toe, no signs of infection with no redness, warmth, tenderness or fluctuance or crepitus.               Skin:    General: Skin is warm and dry.     Comments:  Neurological:     General: grossly non focal, sitting in a wheel chair    Mental Status: awake, alert and answers questions appropriately but has poor memory and struggles with orientation questions   Psychiatric:        Mood and Affect: Mood normal.   Lab Results Lab Results  Component Value Date   WBC 8.0 05/11/2022   HGB 8.2 (L) 05/11/2022   HCT 25.8 (L) 05/11/2022   MCV 82.7 05/11/2022   PLT 267 05/11/2022    Lab Results  Component Value Date   CREATININE 0.42 (L) 05/11/2022   BUN <5 (L) 05/11/2022   NA 132 (L) 05/11/2022   K 4.0 05/11/2022   CL 96 (L) 05/11/2022   CO2 27 05/11/2022    Lab Results  Component Value Date   ALT 12 05/10/2022  AST 20 05/10/2022   ALKPHOS 43 05/10/2022   BILITOT 0.2 (L) 05/10/2022    Lab Results  Component Value Date   CHOL 90 08/09/2021   HDL 38 (L) 08/09/2021   LDLCALC 42 08/09/2021   LDLDIRECT 89.0 10/14/2014   TRIG 50 08/09/2021   CHOLHDL 2.4 08/09/2021   Lab Results  Component Value Date   LABRPR NON REACTIVE 01/11/2022   No results found for: "HIV1RNAQUANT", "HIV1RNAVL", "CD4TABS"   Assessment/Plan  21 Y O Female with PMH as below including DM2 w polyneuropathy, CVA/cognitive communication deficit, s/p PPM  with   # Prior RT lateral Foot DU - has closed/healed  # Left lateral foot open wound # left heel superficial ulcer - no signs of infection on exam  today  - not on abtx for approx 2/5 months with no signs of sepsis - continue wound care and referral to wound care placed - Fu in a month for clinical evaluation  - Discussed with patient and staff regarding potential for bacteremia with open wounds if infected and need for optimal wound care esp given her PPM   I have personally spent 45 minutes involved in face-to-face and non-face-to-face activities for this patient on the day of the visit. Professional time spent includes the following activities: Preparing to see the patient (review of tests), Obtaining and/or reviewing separately obtained history (admission/discharge record), Performing a medically appropriate examination and/or evaluation , Ordering medications/tests/procedures, referring and communicating with other health care professionals, Documenting clinical information in the EMR, Independently interpreting results (not separately reported), Communicating results to the patient/family/caregiver, Counseling and educating the patient/family/caregiver and Care coordination (not separately reported).   Wilber Oliphant, [place] [facility] 07/06/2022, 11:15 AM

## 2022-07-07 ENCOUNTER — Ambulatory Visit (HOSPITAL_COMMUNITY): Admission: RE | Admit: 2022-07-07 | Payer: Medicare Other | Source: Ambulatory Visit

## 2022-07-07 ENCOUNTER — Encounter (HOSPITAL_COMMUNITY): Payer: Self-pay

## 2022-07-11 DIAGNOSIS — L89890 Pressure ulcer of other site, unstageable: Secondary | ICD-10-CM | POA: Diagnosis not present

## 2022-07-11 DIAGNOSIS — L89620 Pressure ulcer of left heel, unstageable: Secondary | ICD-10-CM | POA: Diagnosis not present

## 2022-07-17 DIAGNOSIS — E1110 Type 2 diabetes mellitus with ketoacidosis without coma: Secondary | ICD-10-CM | POA: Diagnosis not present

## 2022-07-17 DIAGNOSIS — Z794 Long term (current) use of insulin: Secondary | ICD-10-CM | POA: Diagnosis not present

## 2022-07-17 DIAGNOSIS — J9610 Chronic respiratory failure, unspecified whether with hypoxia or hypercapnia: Secondary | ICD-10-CM | POA: Diagnosis not present

## 2022-07-17 DIAGNOSIS — J449 Chronic obstructive pulmonary disease, unspecified: Secondary | ICD-10-CM | POA: Diagnosis not present

## 2022-07-17 DIAGNOSIS — F419 Anxiety disorder, unspecified: Secondary | ICD-10-CM | POA: Diagnosis not present

## 2022-07-19 NOTE — Progress Notes (Signed)
Remote pacemaker transmission.   

## 2022-07-21 ENCOUNTER — Ambulatory Visit (HOSPITAL_COMMUNITY): Payer: Medicare Other

## 2022-08-07 DIAGNOSIS — E1165 Type 2 diabetes mellitus with hyperglycemia: Secondary | ICD-10-CM | POA: Diagnosis not present

## 2022-08-07 DIAGNOSIS — S22080D Wedge compression fracture of T11-T12 vertebra, subsequent encounter for fracture with routine healing: Secondary | ICD-10-CM | POA: Diagnosis not present

## 2022-08-07 DIAGNOSIS — Z794 Long term (current) use of insulin: Secondary | ICD-10-CM | POA: Diagnosis not present

## 2022-08-07 DIAGNOSIS — E11649 Type 2 diabetes mellitus with hypoglycemia without coma: Secondary | ICD-10-CM | POA: Diagnosis not present

## 2022-08-07 DIAGNOSIS — N139 Obstructive and reflux uropathy, unspecified: Secondary | ICD-10-CM | POA: Diagnosis not present

## 2022-08-08 DIAGNOSIS — Z741 Need for assistance with personal care: Secondary | ICD-10-CM | POA: Diagnosis not present

## 2022-08-08 DIAGNOSIS — J449 Chronic obstructive pulmonary disease, unspecified: Secondary | ICD-10-CM | POA: Diagnosis not present

## 2022-09-04 ENCOUNTER — Ambulatory Visit (INDEPENDENT_AMBULATORY_CARE_PROVIDER_SITE_OTHER): Payer: Medicare Other

## 2022-09-04 DIAGNOSIS — I442 Atrioventricular block, complete: Secondary | ICD-10-CM

## 2022-09-05 ENCOUNTER — Ambulatory Visit (HOSPITAL_COMMUNITY): Payer: Medicare Other

## 2022-09-05 DIAGNOSIS — Z23 Encounter for immunization: Secondary | ICD-10-CM | POA: Diagnosis not present

## 2022-09-05 LAB — CUP PACEART REMOTE DEVICE CHECK
Battery Remaining Longevity: 84 mo
Battery Remaining Percentage: 75 %
Battery Voltage: 3.01 V
Brady Statistic AP VP Percent: 1 %
Brady Statistic AP VS Percent: 1 %
Brady Statistic AS VP Percent: 99 %
Brady Statistic AS VS Percent: 1 %
Brady Statistic RA Percent Paced: 1 %
Brady Statistic RV Percent Paced: 99 %
Date Time Interrogation Session: 20240429020013
Implantable Lead Connection Status: 753985
Implantable Lead Connection Status: 753985
Implantable Lead Implant Date: 20210908
Implantable Lead Implant Date: 20210908
Implantable Lead Location: 753859
Implantable Lead Location: 753860
Implantable Pulse Generator Implant Date: 20210908
Lead Channel Impedance Value: 390 Ohm
Lead Channel Impedance Value: 450 Ohm
Lead Channel Pacing Threshold Amplitude: 0.75 V
Lead Channel Pacing Threshold Amplitude: 1 V
Lead Channel Pacing Threshold Pulse Width: 0.4 ms
Lead Channel Pacing Threshold Pulse Width: 0.5 ms
Lead Channel Sensing Intrinsic Amplitude: 11.1 mV
Lead Channel Sensing Intrinsic Amplitude: 2 mV
Lead Channel Setting Pacing Amplitude: 1.25 V
Lead Channel Setting Pacing Amplitude: 2 V
Lead Channel Setting Pacing Pulse Width: 0.5 ms
Lead Channel Setting Sensing Sensitivity: 2 mV
Pulse Gen Model: 2272
Pulse Gen Serial Number: 3856705

## 2022-09-06 DIAGNOSIS — E7849 Other hyperlipidemia: Secondary | ICD-10-CM | POA: Diagnosis not present

## 2022-09-06 DIAGNOSIS — E08319 Diabetes mellitus due to underlying condition with unspecified diabetic retinopathy without macular edema: Secondary | ICD-10-CM | POA: Diagnosis not present

## 2022-09-22 DIAGNOSIS — S91302A Unspecified open wound, left foot, initial encounter: Secondary | ICD-10-CM | POA: Insufficient documentation

## 2022-09-26 DIAGNOSIS — L89890 Pressure ulcer of other site, unstageable: Secondary | ICD-10-CM | POA: Diagnosis not present

## 2022-09-26 DIAGNOSIS — L89620 Pressure ulcer of left heel, unstageable: Secondary | ICD-10-CM | POA: Diagnosis not present

## 2022-10-05 NOTE — Progress Notes (Signed)
Remote pacemaker transmission.   

## 2022-10-09 DIAGNOSIS — I152 Hypertension secondary to endocrine disorders: Secondary | ICD-10-CM | POA: Diagnosis not present

## 2022-10-09 DIAGNOSIS — Z9981 Dependence on supplemental oxygen: Secondary | ICD-10-CM | POA: Diagnosis not present

## 2022-10-09 DIAGNOSIS — Z794 Long term (current) use of insulin: Secondary | ICD-10-CM | POA: Diagnosis not present

## 2022-10-09 DIAGNOSIS — R0989 Other specified symptoms and signs involving the circulatory and respiratory systems: Secondary | ICD-10-CM | POA: Diagnosis not present

## 2022-10-09 DIAGNOSIS — I442 Atrioventricular block, complete: Secondary | ICD-10-CM | POA: Diagnosis not present

## 2022-10-09 DIAGNOSIS — N1330 Unspecified hydronephrosis: Secondary | ICD-10-CM | POA: Diagnosis not present

## 2022-10-09 DIAGNOSIS — E118 Type 2 diabetes mellitus with unspecified complications: Secondary | ICD-10-CM | POA: Diagnosis not present

## 2022-10-09 DIAGNOSIS — L89620 Pressure ulcer of left heel, unstageable: Secondary | ICD-10-CM | POA: Diagnosis not present

## 2022-10-09 DIAGNOSIS — J9610 Chronic respiratory failure, unspecified whether with hypoxia or hypercapnia: Secondary | ICD-10-CM | POA: Diagnosis not present

## 2022-10-09 DIAGNOSIS — M86672 Other chronic osteomyelitis, left ankle and foot: Secondary | ICD-10-CM | POA: Diagnosis not present

## 2022-10-09 DIAGNOSIS — E46 Unspecified protein-calorie malnutrition: Secondary | ICD-10-CM | POA: Diagnosis not present

## 2022-10-09 DIAGNOSIS — Z87440 Personal history of urinary (tract) infections: Secondary | ICD-10-CM | POA: Diagnosis not present

## 2022-10-09 DIAGNOSIS — Z7984 Long term (current) use of oral hypoglycemic drugs: Secondary | ICD-10-CM | POA: Diagnosis not present

## 2022-10-09 DIAGNOSIS — R339 Retention of urine, unspecified: Secondary | ICD-10-CM | POA: Diagnosis not present

## 2022-10-29 DIAGNOSIS — R262 Difficulty in walking, not elsewhere classified: Secondary | ICD-10-CM | POA: Diagnosis not present

## 2022-10-29 DIAGNOSIS — J449 Chronic obstructive pulmonary disease, unspecified: Secondary | ICD-10-CM | POA: Diagnosis not present

## 2022-11-01 DIAGNOSIS — L89620 Pressure ulcer of left heel, unstageable: Secondary | ICD-10-CM | POA: Diagnosis not present

## 2022-11-01 DIAGNOSIS — L89890 Pressure ulcer of other site, unstageable: Secondary | ICD-10-CM | POA: Diagnosis not present

## 2022-11-01 DIAGNOSIS — R238 Other skin changes: Secondary | ICD-10-CM | POA: Diagnosis not present

## 2022-11-08 DIAGNOSIS — Z794 Long term (current) use of insulin: Secondary | ICD-10-CM | POA: Diagnosis not present

## 2022-11-08 DIAGNOSIS — K589 Irritable bowel syndrome without diarrhea: Secondary | ICD-10-CM | POA: Diagnosis not present

## 2022-11-08 DIAGNOSIS — R197 Diarrhea, unspecified: Secondary | ICD-10-CM | POA: Diagnosis not present

## 2022-11-08 DIAGNOSIS — E1165 Type 2 diabetes mellitus with hyperglycemia: Secondary | ICD-10-CM | POA: Diagnosis not present

## 2022-11-08 DIAGNOSIS — Z7984 Long term (current) use of oral hypoglycemic drugs: Secondary | ICD-10-CM | POA: Diagnosis not present

## 2022-11-08 DIAGNOSIS — R000 Tachycardia, unspecified: Secondary | ICD-10-CM | POA: Diagnosis not present

## 2022-11-08 DIAGNOSIS — R109 Unspecified abdominal pain: Secondary | ICD-10-CM | POA: Diagnosis not present

## 2022-11-10 DIAGNOSIS — E785 Hyperlipidemia, unspecified: Secondary | ICD-10-CM | POA: Diagnosis not present

## 2022-11-10 DIAGNOSIS — E119 Type 2 diabetes mellitus without complications: Secondary | ICD-10-CM | POA: Diagnosis not present

## 2022-11-10 DIAGNOSIS — D519 Vitamin B12 deficiency anemia, unspecified: Secondary | ICD-10-CM | POA: Diagnosis not present

## 2022-11-10 DIAGNOSIS — D649 Anemia, unspecified: Secondary | ICD-10-CM | POA: Diagnosis not present

## 2022-11-10 DIAGNOSIS — I10 Essential (primary) hypertension: Secondary | ICD-10-CM | POA: Diagnosis not present

## 2022-11-22 DIAGNOSIS — L97519 Non-pressure chronic ulcer of other part of right foot with unspecified severity: Secondary | ICD-10-CM | POA: Diagnosis not present

## 2022-11-22 DIAGNOSIS — L89620 Pressure ulcer of left heel, unstageable: Secondary | ICD-10-CM | POA: Diagnosis not present

## 2022-11-22 DIAGNOSIS — L89890 Pressure ulcer of other site, unstageable: Secondary | ICD-10-CM | POA: Diagnosis not present

## 2022-11-22 DIAGNOSIS — E11621 Type 2 diabetes mellitus with foot ulcer: Secondary | ICD-10-CM | POA: Diagnosis not present

## 2022-11-22 DIAGNOSIS — L97529 Non-pressure chronic ulcer of other part of left foot with unspecified severity: Secondary | ICD-10-CM | POA: Diagnosis not present

## 2022-11-22 DIAGNOSIS — E1151 Type 2 diabetes mellitus with diabetic peripheral angiopathy without gangrene: Secondary | ICD-10-CM | POA: Diagnosis not present

## 2022-12-04 ENCOUNTER — Ambulatory Visit: Payer: Medicare Other

## 2022-12-15 ENCOUNTER — Encounter: Payer: Self-pay | Admitting: Family Medicine

## 2022-12-15 DIAGNOSIS — Z794 Long term (current) use of insulin: Secondary | ICD-10-CM | POA: Diagnosis not present

## 2022-12-15 DIAGNOSIS — E1151 Type 2 diabetes mellitus with diabetic peripheral angiopathy without gangrene: Secondary | ICD-10-CM | POA: Diagnosis not present

## 2022-12-15 DIAGNOSIS — L97529 Non-pressure chronic ulcer of other part of left foot with unspecified severity: Secondary | ICD-10-CM | POA: Diagnosis not present

## 2022-12-15 DIAGNOSIS — N139 Obstructive and reflux uropathy, unspecified: Secondary | ICD-10-CM | POA: Diagnosis not present

## 2022-12-15 DIAGNOSIS — Z960 Presence of urogenital implants: Secondary | ICD-10-CM | POA: Diagnosis not present

## 2022-12-15 DIAGNOSIS — F03A4 Unspecified dementia, mild, with anxiety: Secondary | ICD-10-CM | POA: Diagnosis not present

## 2022-12-15 DIAGNOSIS — Z9981 Dependence on supplemental oxygen: Secondary | ICD-10-CM | POA: Diagnosis not present

## 2022-12-15 DIAGNOSIS — J9610 Chronic respiratory failure, unspecified whether with hypoxia or hypercapnia: Secondary | ICD-10-CM | POA: Diagnosis not present

## 2022-12-15 DIAGNOSIS — F419 Anxiety disorder, unspecified: Secondary | ICD-10-CM | POA: Diagnosis not present

## 2022-12-15 DIAGNOSIS — J449 Chronic obstructive pulmonary disease, unspecified: Secondary | ICD-10-CM | POA: Diagnosis not present

## 2022-12-15 DIAGNOSIS — I152 Hypertension secondary to endocrine disorders: Secondary | ICD-10-CM | POA: Diagnosis not present

## 2022-12-15 DIAGNOSIS — Z7984 Long term (current) use of oral hypoglycemic drugs: Secondary | ICD-10-CM | POA: Diagnosis not present

## 2022-12-15 DIAGNOSIS — E11621 Type 2 diabetes mellitus with foot ulcer: Secondary | ICD-10-CM | POA: Diagnosis not present

## 2022-12-18 ENCOUNTER — Encounter: Payer: Self-pay | Admitting: Family Medicine

## 2022-12-19 ENCOUNTER — Encounter: Payer: Self-pay | Admitting: Family Medicine

## 2022-12-19 DIAGNOSIS — F331 Major depressive disorder, recurrent, moderate: Secondary | ICD-10-CM | POA: Diagnosis not present

## 2022-12-19 DIAGNOSIS — F411 Generalized anxiety disorder: Secondary | ICD-10-CM | POA: Diagnosis not present

## 2022-12-22 DIAGNOSIS — E119 Type 2 diabetes mellitus without complications: Secondary | ICD-10-CM | POA: Diagnosis not present

## 2023-01-03 DIAGNOSIS — L89890 Pressure ulcer of other site, unstageable: Secondary | ICD-10-CM | POA: Diagnosis not present

## 2023-01-03 DIAGNOSIS — I152 Hypertension secondary to endocrine disorders: Secondary | ICD-10-CM | POA: Diagnosis not present

## 2023-01-03 DIAGNOSIS — R339 Retention of urine, unspecified: Secondary | ICD-10-CM | POA: Diagnosis not present

## 2023-01-03 DIAGNOSIS — L89620 Pressure ulcer of left heel, unstageable: Secondary | ICD-10-CM | POA: Diagnosis not present

## 2023-01-03 DIAGNOSIS — Z681 Body mass index (BMI) 19 or less, adult: Secondary | ICD-10-CM | POA: Diagnosis not present

## 2023-01-03 DIAGNOSIS — R4182 Altered mental status, unspecified: Secondary | ICD-10-CM | POA: Diagnosis not present

## 2023-01-03 DIAGNOSIS — L89610 Pressure ulcer of right heel, unstageable: Secondary | ICD-10-CM | POA: Diagnosis not present

## 2023-01-03 DIAGNOSIS — R627 Adult failure to thrive: Secondary | ICD-10-CM | POA: Diagnosis not present

## 2023-01-05 DIAGNOSIS — J9610 Chronic respiratory failure, unspecified whether with hypoxia or hypercapnia: Secondary | ICD-10-CM | POA: Diagnosis not present

## 2023-01-05 DIAGNOSIS — I479 Paroxysmal tachycardia, unspecified: Secondary | ICD-10-CM | POA: Diagnosis not present

## 2023-01-05 DIAGNOSIS — E873 Alkalosis: Secondary | ICD-10-CM | POA: Diagnosis not present

## 2023-01-05 DIAGNOSIS — Z789 Other specified health status: Secondary | ICD-10-CM | POA: Diagnosis not present

## 2023-01-05 DIAGNOSIS — L97509 Non-pressure chronic ulcer of other part of unspecified foot with unspecified severity: Secondary | ICD-10-CM | POA: Diagnosis not present

## 2023-01-05 DIAGNOSIS — R627 Adult failure to thrive: Secondary | ICD-10-CM | POA: Diagnosis not present

## 2023-01-05 DIAGNOSIS — Z794 Long term (current) use of insulin: Secondary | ICD-10-CM | POA: Diagnosis not present

## 2023-01-05 DIAGNOSIS — E11621 Type 2 diabetes mellitus with foot ulcer: Secondary | ICD-10-CM | POA: Diagnosis not present

## 2023-01-09 DIAGNOSIS — L89620 Pressure ulcer of left heel, unstageable: Secondary | ICD-10-CM | POA: Diagnosis not present

## 2023-01-09 DIAGNOSIS — E43 Unspecified severe protein-calorie malnutrition: Secondary | ICD-10-CM | POA: Diagnosis not present

## 2023-01-09 DIAGNOSIS — L89610 Pressure ulcer of right heel, unstageable: Secondary | ICD-10-CM | POA: Diagnosis not present

## 2023-01-09 DIAGNOSIS — I152 Hypertension secondary to endocrine disorders: Secondary | ICD-10-CM | POA: Diagnosis not present

## 2023-01-09 DIAGNOSIS — Z794 Long term (current) use of insulin: Secondary | ICD-10-CM | POA: Diagnosis not present

## 2023-01-09 DIAGNOSIS — J9610 Chronic respiratory failure, unspecified whether with hypoxia or hypercapnia: Secondary | ICD-10-CM | POA: Diagnosis not present

## 2023-01-09 DIAGNOSIS — J449 Chronic obstructive pulmonary disease, unspecified: Secondary | ICD-10-CM | POA: Diagnosis not present

## 2023-01-09 DIAGNOSIS — E1149 Type 2 diabetes mellitus with other diabetic neurological complication: Secondary | ICD-10-CM | POA: Diagnosis not present

## 2023-01-09 DIAGNOSIS — E639 Nutritional deficiency, unspecified: Secondary | ICD-10-CM | POA: Diagnosis not present

## 2023-01-09 DIAGNOSIS — I479 Paroxysmal tachycardia, unspecified: Secondary | ICD-10-CM | POA: Diagnosis not present

## 2023-01-09 DIAGNOSIS — R339 Retention of urine, unspecified: Secondary | ICD-10-CM | POA: Diagnosis not present

## 2023-01-09 DIAGNOSIS — E118 Type 2 diabetes mellitus with unspecified complications: Secondary | ICD-10-CM | POA: Diagnosis not present

## 2023-01-09 DIAGNOSIS — F419 Anxiety disorder, unspecified: Secondary | ICD-10-CM | POA: Diagnosis not present

## 2023-01-10 DIAGNOSIS — E639 Nutritional deficiency, unspecified: Secondary | ICD-10-CM | POA: Diagnosis not present

## 2023-01-10 DIAGNOSIS — F419 Anxiety disorder, unspecified: Secondary | ICD-10-CM | POA: Diagnosis not present

## 2023-01-10 DIAGNOSIS — L89620 Pressure ulcer of left heel, unstageable: Secondary | ICD-10-CM | POA: Diagnosis not present

## 2023-01-10 DIAGNOSIS — E43 Unspecified severe protein-calorie malnutrition: Secondary | ICD-10-CM | POA: Diagnosis not present

## 2023-01-10 DIAGNOSIS — J449 Chronic obstructive pulmonary disease, unspecified: Secondary | ICD-10-CM | POA: Diagnosis not present

## 2023-01-10 DIAGNOSIS — L89610 Pressure ulcer of right heel, unstageable: Secondary | ICD-10-CM | POA: Diagnosis not present

## 2023-01-11 DIAGNOSIS — J449 Chronic obstructive pulmonary disease, unspecified: Secondary | ICD-10-CM | POA: Diagnosis not present

## 2023-01-11 DIAGNOSIS — E43 Unspecified severe protein-calorie malnutrition: Secondary | ICD-10-CM | POA: Diagnosis not present

## 2023-01-11 DIAGNOSIS — F419 Anxiety disorder, unspecified: Secondary | ICD-10-CM | POA: Diagnosis not present

## 2023-01-11 DIAGNOSIS — E639 Nutritional deficiency, unspecified: Secondary | ICD-10-CM | POA: Diagnosis not present

## 2023-01-11 DIAGNOSIS — L89610 Pressure ulcer of right heel, unstageable: Secondary | ICD-10-CM | POA: Diagnosis not present

## 2023-01-11 DIAGNOSIS — L89620 Pressure ulcer of left heel, unstageable: Secondary | ICD-10-CM | POA: Diagnosis not present

## 2023-01-15 DIAGNOSIS — E43 Unspecified severe protein-calorie malnutrition: Secondary | ICD-10-CM | POA: Diagnosis not present

## 2023-01-15 DIAGNOSIS — J449 Chronic obstructive pulmonary disease, unspecified: Secondary | ICD-10-CM | POA: Diagnosis not present

## 2023-01-15 DIAGNOSIS — F419 Anxiety disorder, unspecified: Secondary | ICD-10-CM | POA: Diagnosis not present

## 2023-01-15 DIAGNOSIS — L89620 Pressure ulcer of left heel, unstageable: Secondary | ICD-10-CM | POA: Diagnosis not present

## 2023-01-15 DIAGNOSIS — L89610 Pressure ulcer of right heel, unstageable: Secondary | ICD-10-CM | POA: Diagnosis not present

## 2023-01-15 DIAGNOSIS — E639 Nutritional deficiency, unspecified: Secondary | ICD-10-CM | POA: Diagnosis not present

## 2023-01-16 DIAGNOSIS — F419 Anxiety disorder, unspecified: Secondary | ICD-10-CM | POA: Diagnosis not present

## 2023-01-16 DIAGNOSIS — L89610 Pressure ulcer of right heel, unstageable: Secondary | ICD-10-CM | POA: Diagnosis not present

## 2023-01-16 DIAGNOSIS — E43 Unspecified severe protein-calorie malnutrition: Secondary | ICD-10-CM | POA: Diagnosis not present

## 2023-01-16 DIAGNOSIS — E639 Nutritional deficiency, unspecified: Secondary | ICD-10-CM | POA: Diagnosis not present

## 2023-01-16 DIAGNOSIS — L89620 Pressure ulcer of left heel, unstageable: Secondary | ICD-10-CM | POA: Diagnosis not present

## 2023-01-16 DIAGNOSIS — J449 Chronic obstructive pulmonary disease, unspecified: Secondary | ICD-10-CM | POA: Diagnosis not present

## 2023-01-17 DIAGNOSIS — E639 Nutritional deficiency, unspecified: Secondary | ICD-10-CM | POA: Diagnosis not present

## 2023-01-17 DIAGNOSIS — E43 Unspecified severe protein-calorie malnutrition: Secondary | ICD-10-CM | POA: Diagnosis not present

## 2023-01-17 DIAGNOSIS — L89620 Pressure ulcer of left heel, unstageable: Secondary | ICD-10-CM | POA: Diagnosis not present

## 2023-01-17 DIAGNOSIS — J449 Chronic obstructive pulmonary disease, unspecified: Secondary | ICD-10-CM | POA: Diagnosis not present

## 2023-01-17 DIAGNOSIS — F419 Anxiety disorder, unspecified: Secondary | ICD-10-CM | POA: Diagnosis not present

## 2023-01-17 DIAGNOSIS — L89610 Pressure ulcer of right heel, unstageable: Secondary | ICD-10-CM | POA: Diagnosis not present

## 2023-01-18 DIAGNOSIS — E43 Unspecified severe protein-calorie malnutrition: Secondary | ICD-10-CM | POA: Diagnosis not present

## 2023-01-18 DIAGNOSIS — F419 Anxiety disorder, unspecified: Secondary | ICD-10-CM | POA: Diagnosis not present

## 2023-01-18 DIAGNOSIS — E639 Nutritional deficiency, unspecified: Secondary | ICD-10-CM | POA: Diagnosis not present

## 2023-01-18 DIAGNOSIS — L89610 Pressure ulcer of right heel, unstageable: Secondary | ICD-10-CM | POA: Diagnosis not present

## 2023-01-18 DIAGNOSIS — J449 Chronic obstructive pulmonary disease, unspecified: Secondary | ICD-10-CM | POA: Diagnosis not present

## 2023-01-18 DIAGNOSIS — L89620 Pressure ulcer of left heel, unstageable: Secondary | ICD-10-CM | POA: Diagnosis not present

## 2023-01-19 DIAGNOSIS — J449 Chronic obstructive pulmonary disease, unspecified: Secondary | ICD-10-CM | POA: Diagnosis not present

## 2023-01-19 DIAGNOSIS — F419 Anxiety disorder, unspecified: Secondary | ICD-10-CM | POA: Diagnosis not present

## 2023-01-19 DIAGNOSIS — L89610 Pressure ulcer of right heel, unstageable: Secondary | ICD-10-CM | POA: Diagnosis not present

## 2023-01-19 DIAGNOSIS — E43 Unspecified severe protein-calorie malnutrition: Secondary | ICD-10-CM | POA: Diagnosis not present

## 2023-01-19 DIAGNOSIS — E639 Nutritional deficiency, unspecified: Secondary | ICD-10-CM | POA: Diagnosis not present

## 2023-01-19 DIAGNOSIS — L89620 Pressure ulcer of left heel, unstageable: Secondary | ICD-10-CM | POA: Diagnosis not present

## 2023-01-20 DIAGNOSIS — L89620 Pressure ulcer of left heel, unstageable: Secondary | ICD-10-CM | POA: Diagnosis not present

## 2023-01-20 DIAGNOSIS — E43 Unspecified severe protein-calorie malnutrition: Secondary | ICD-10-CM | POA: Diagnosis not present

## 2023-01-20 DIAGNOSIS — J449 Chronic obstructive pulmonary disease, unspecified: Secondary | ICD-10-CM | POA: Diagnosis not present

## 2023-01-20 DIAGNOSIS — F419 Anxiety disorder, unspecified: Secondary | ICD-10-CM | POA: Diagnosis not present

## 2023-01-20 DIAGNOSIS — E639 Nutritional deficiency, unspecified: Secondary | ICD-10-CM | POA: Diagnosis not present

## 2023-01-20 DIAGNOSIS — L89610 Pressure ulcer of right heel, unstageable: Secondary | ICD-10-CM | POA: Diagnosis not present

## 2023-01-22 DIAGNOSIS — L89610 Pressure ulcer of right heel, unstageable: Secondary | ICD-10-CM | POA: Diagnosis not present

## 2023-01-22 DIAGNOSIS — F419 Anxiety disorder, unspecified: Secondary | ICD-10-CM | POA: Diagnosis not present

## 2023-01-22 DIAGNOSIS — F03A4 Unspecified dementia, mild, with anxiety: Secondary | ICD-10-CM | POA: Diagnosis not present

## 2023-01-22 DIAGNOSIS — L89620 Pressure ulcer of left heel, unstageable: Secondary | ICD-10-CM | POA: Diagnosis not present

## 2023-01-22 DIAGNOSIS — R627 Adult failure to thrive: Secondary | ICD-10-CM | POA: Diagnosis not present

## 2023-01-22 DIAGNOSIS — E639 Nutritional deficiency, unspecified: Secondary | ICD-10-CM | POA: Diagnosis not present

## 2023-01-22 DIAGNOSIS — J449 Chronic obstructive pulmonary disease, unspecified: Secondary | ICD-10-CM | POA: Diagnosis not present

## 2023-01-22 DIAGNOSIS — E43 Unspecified severe protein-calorie malnutrition: Secondary | ICD-10-CM | POA: Diagnosis not present

## 2023-01-23 DIAGNOSIS — F419 Anxiety disorder, unspecified: Secondary | ICD-10-CM | POA: Diagnosis not present

## 2023-01-23 DIAGNOSIS — J449 Chronic obstructive pulmonary disease, unspecified: Secondary | ICD-10-CM | POA: Diagnosis not present

## 2023-01-23 DIAGNOSIS — E43 Unspecified severe protein-calorie malnutrition: Secondary | ICD-10-CM | POA: Diagnosis not present

## 2023-01-23 DIAGNOSIS — L89610 Pressure ulcer of right heel, unstageable: Secondary | ICD-10-CM | POA: Diagnosis not present

## 2023-01-23 DIAGNOSIS — E639 Nutritional deficiency, unspecified: Secondary | ICD-10-CM | POA: Diagnosis not present

## 2023-01-23 DIAGNOSIS — L89620 Pressure ulcer of left heel, unstageable: Secondary | ICD-10-CM | POA: Diagnosis not present

## 2023-01-24 DIAGNOSIS — F419 Anxiety disorder, unspecified: Secondary | ICD-10-CM | POA: Diagnosis not present

## 2023-01-24 DIAGNOSIS — E43 Unspecified severe protein-calorie malnutrition: Secondary | ICD-10-CM | POA: Diagnosis not present

## 2023-01-24 DIAGNOSIS — E639 Nutritional deficiency, unspecified: Secondary | ICD-10-CM | POA: Diagnosis not present

## 2023-01-24 DIAGNOSIS — L89620 Pressure ulcer of left heel, unstageable: Secondary | ICD-10-CM | POA: Diagnosis not present

## 2023-01-24 DIAGNOSIS — L89610 Pressure ulcer of right heel, unstageable: Secondary | ICD-10-CM | POA: Diagnosis not present

## 2023-01-24 DIAGNOSIS — J449 Chronic obstructive pulmonary disease, unspecified: Secondary | ICD-10-CM | POA: Diagnosis not present

## 2023-02-06 DEATH — deceased

## 2023-03-05 ENCOUNTER — Ambulatory Visit: Payer: Medicare Other

## 2023-06-04 ENCOUNTER — Ambulatory Visit: Payer: Medicare Other

## 2023-09-03 ENCOUNTER — Ambulatory Visit: Payer: Medicare Other

## 2023-09-12 IMAGING — DX DG CHEST 2V
2 series · 2 of 2 positions shown · non-contrast
Comparison: Chest x-ray 01/19/2020.

CLINICAL DATA: 85-year-old male with history of left lateral chest
wall pain. History of COPD.

EXAM:
CHEST - 2 VIEW

[chest pa]
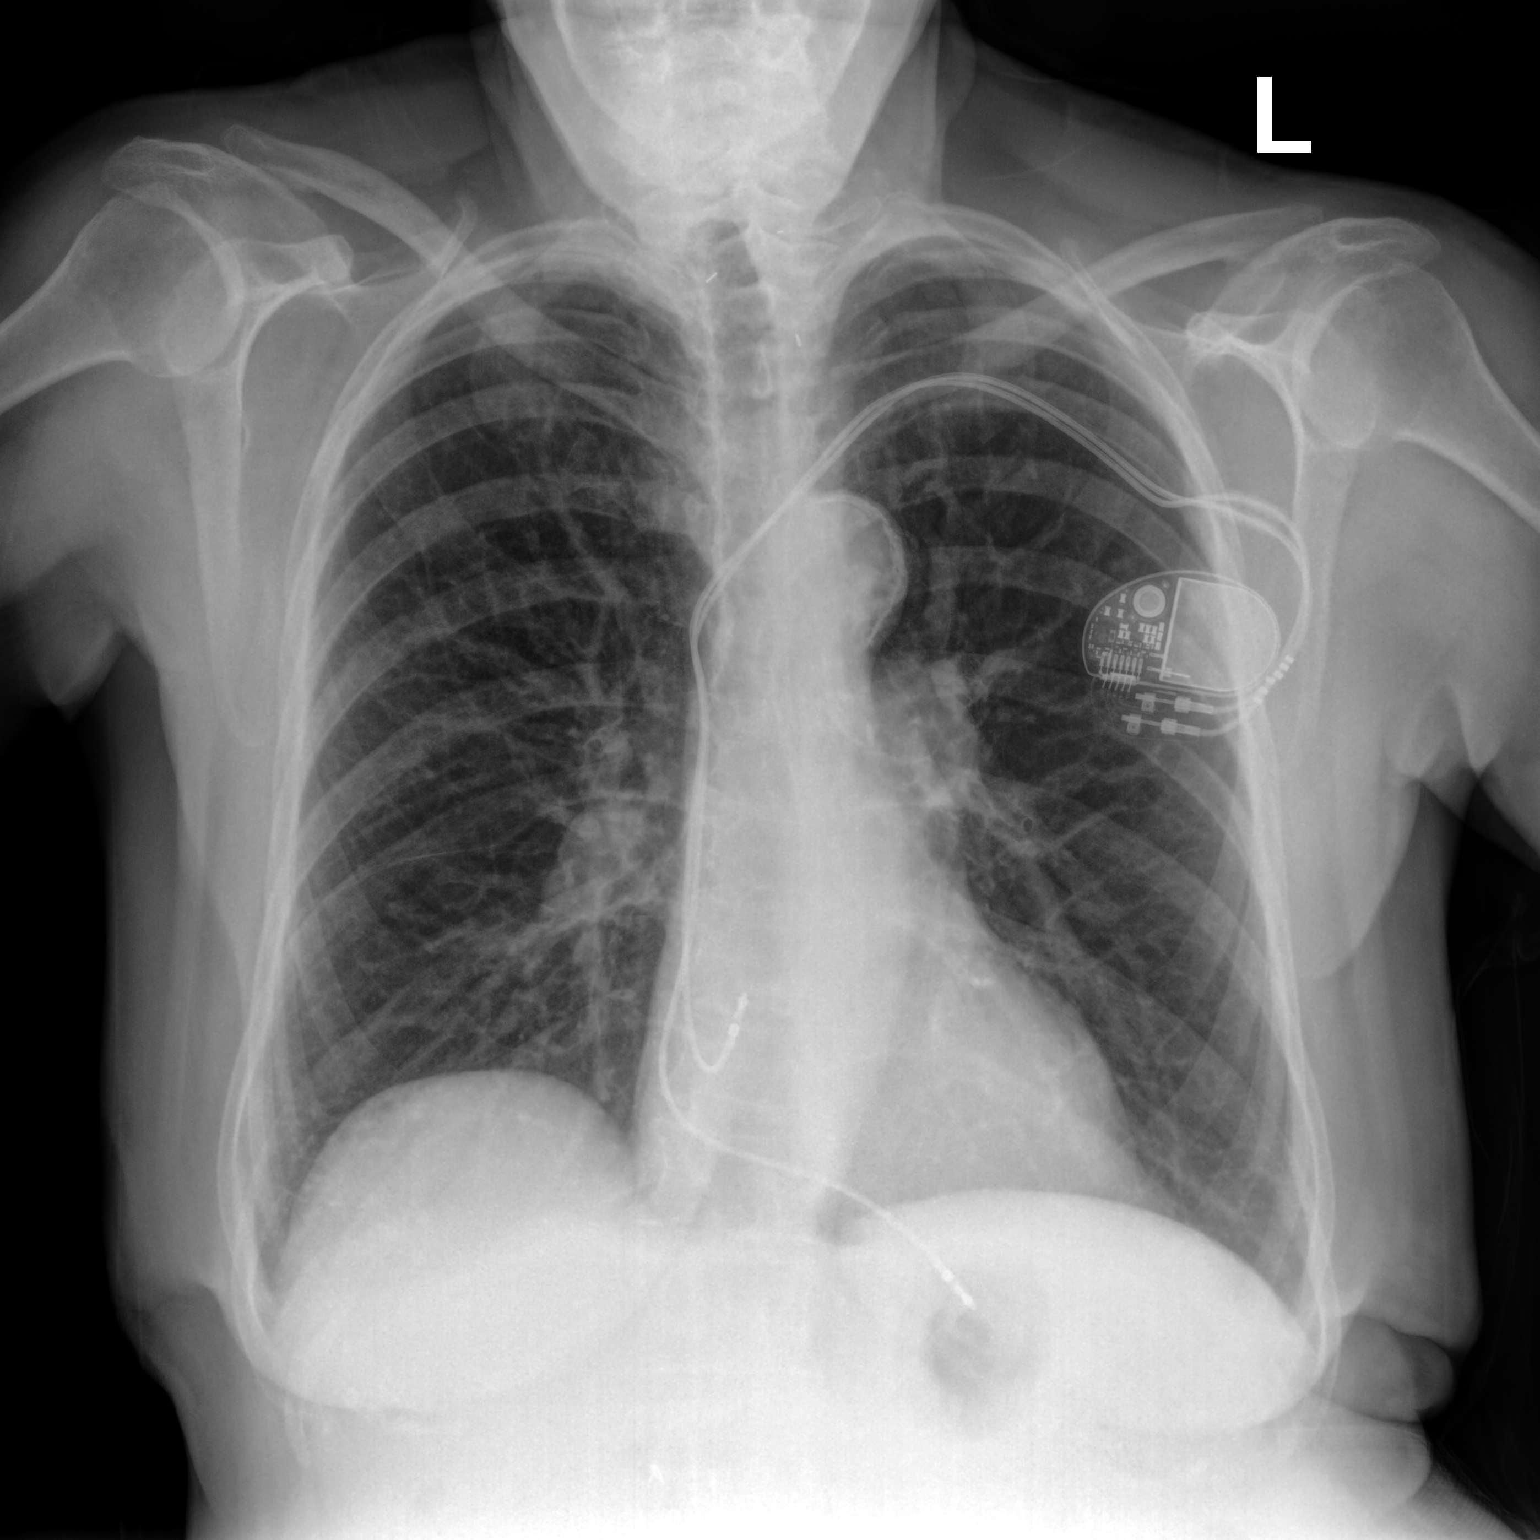

[chest lat]
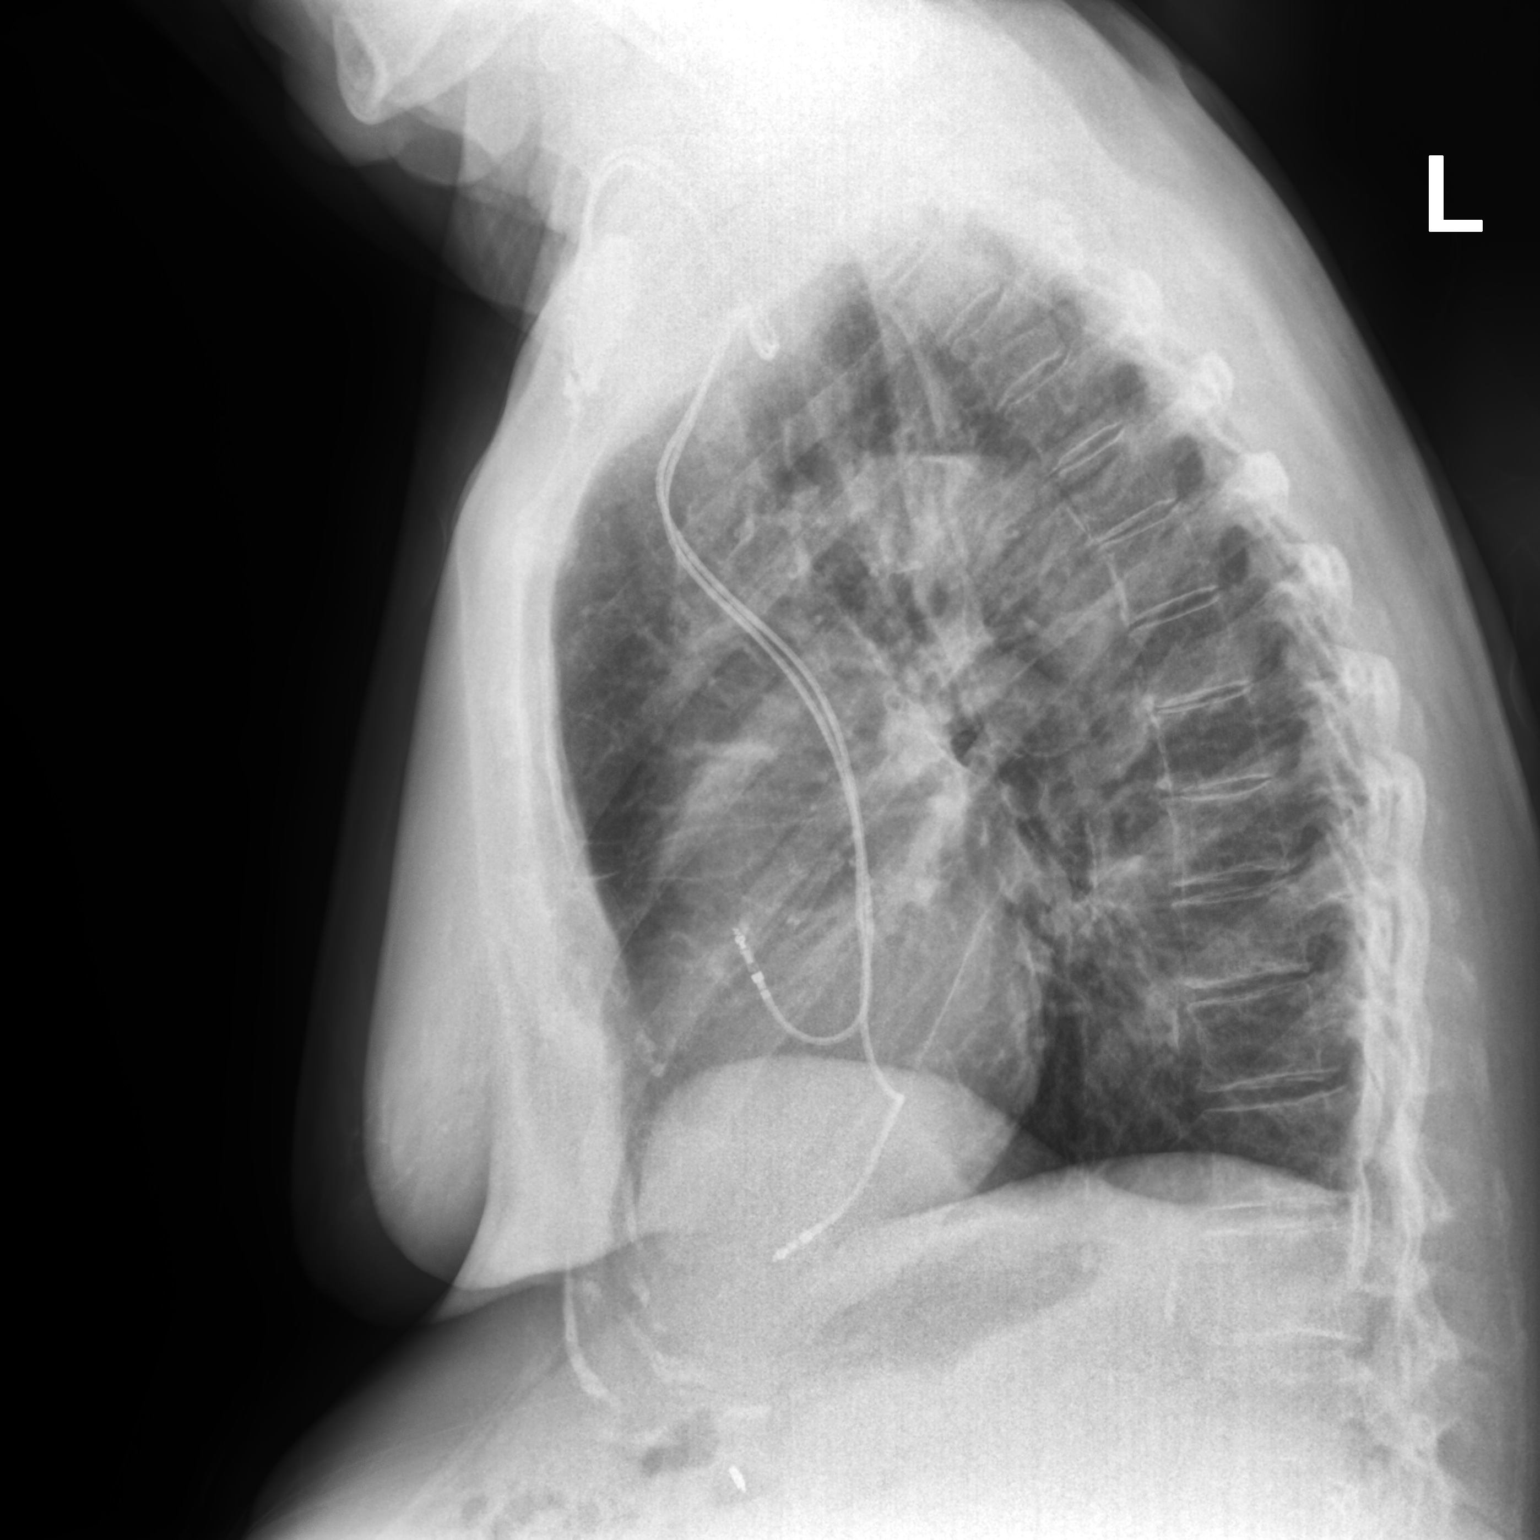

[2 of 2 positions shown; findings below may reference images not displayed]

FINDINGS: Lung volumes are normal. No consolidative airspace disease. No
pleural effusions. No pneumothorax. No pulmonary nodule or mass
noted. Pulmonary vasculature and the cardiomediastinal silhouette
are within normal limits. Atherosclerosis in the thoracic aorta.
Left-sided pacemaker device in place with lead tips projecting over
the expected location of the right atrium and right ventricle.
IMPRESSION: 1. No radiographic evidence of acute cardiopulmonary disease.
2. Aortic atherosclerosis.

## 2023-12-03 ENCOUNTER — Ambulatory Visit: Payer: Medicare Other

## 2024-03-03 ENCOUNTER — Ambulatory Visit: Payer: Medicare Other

## 2024-06-02 ENCOUNTER — Ambulatory Visit: Payer: Medicare Other
